# Patient Record
Sex: Male | Born: 1955
Health system: Southern US, Community
[De-identification: ages and names within clinical notes are randomized; demographics above are authoritative.]

## PROBLEM LIST (undated history)

## (undated) DIAGNOSIS — C73 Malignant neoplasm of thyroid gland: Secondary | ICD-10-CM

## (undated) DIAGNOSIS — B9561 Methicillin susceptible Staphylococcus aureus infection as the cause of diseases classified elsewhere: Secondary | ICD-10-CM

## (undated) DIAGNOSIS — I350 Nonrheumatic aortic (valve) stenosis: Secondary | ICD-10-CM

## (undated) DIAGNOSIS — F329 Major depressive disorder, single episode, unspecified: Secondary | ICD-10-CM

## (undated) DIAGNOSIS — Z992 Dependence on renal dialysis: Secondary | ICD-10-CM

## (undated) DIAGNOSIS — I472 Ventricular tachycardia, unspecified: Secondary | ICD-10-CM

## (undated) DIAGNOSIS — T826XXA Infection and inflammatory reaction due to cardiac valve prosthesis, initial encounter: Secondary | ICD-10-CM

## (undated) DIAGNOSIS — I10 Essential (primary) hypertension: Secondary | ICD-10-CM

## (undated) DIAGNOSIS — I330 Acute and subacute infective endocarditis: Secondary | ICD-10-CM

## (undated) DIAGNOSIS — Z9289 Personal history of other medical treatment: Secondary | ICD-10-CM

## (undated) DIAGNOSIS — I509 Heart failure, unspecified: Secondary | ICD-10-CM

## (undated) DIAGNOSIS — R7881 Bacteremia: Secondary | ICD-10-CM

## (undated) DIAGNOSIS — Z9889 Other specified postprocedural states: Secondary | ICD-10-CM

## (undated) DIAGNOSIS — N186 End stage renal disease: Secondary | ICD-10-CM

## (undated) DIAGNOSIS — F32A Depression, unspecified: Secondary | ICD-10-CM

## (undated) DIAGNOSIS — E890 Postprocedural hypothyroidism: Secondary | ICD-10-CM

## (undated) DIAGNOSIS — F419 Anxiety disorder, unspecified: Secondary | ICD-10-CM

## (undated) DIAGNOSIS — I38 Endocarditis, valve unspecified: Secondary | ICD-10-CM

## (undated) HISTORY — DX: Heart failure, unspecified: I50.9

## (undated) HISTORY — DX: Major depressive disorder, single episode, unspecified: F32.9

## (undated) HISTORY — DX: Ventricular tachycardia: I47.2

## (undated) HISTORY — DX: Depression, unspecified: F32.A

## (undated) HISTORY — DX: Essential (primary) hypertension: I10

## (undated) HISTORY — PX: THYROIDECTOMY: SHX17

## (undated) HISTORY — PX: CARDIAC VALVE REPLACEMENT: SHX585

## (undated) HISTORY — DX: Other specified postprocedural states: Z98.890

## (undated) HISTORY — DX: Endocarditis, valve unspecified: I38

## (undated) HISTORY — DX: Methicillin susceptible Staphylococcus aureus infection as the cause of diseases classified elsewhere: B95.61

## (undated) HISTORY — DX: Personal history of other medical treatment: Z92.89

## (undated) HISTORY — DX: Bacteremia: R78.81

## (undated) HISTORY — DX: Acute and subacute infective endocarditis: I33.0

## (undated) HISTORY — DX: End stage renal disease: Z99.2

## (undated) HISTORY — DX: Postprocedural hypothyroidism: E89.0

## (undated) HISTORY — PX: STERNOTOMY: SHX1057

## (undated) HISTORY — DX: Infection and inflammatory reaction due to cardiac valve prosthesis, initial encounter: T82.6XXA

## (undated) HISTORY — DX: Anxiety disorder, unspecified: F41.9

## (undated) HISTORY — DX: Ventricular tachycardia, unspecified: I47.20

## (undated) HISTORY — DX: End stage renal disease: N18.6

## (undated) HISTORY — DX: Nonrheumatic aortic (valve) stenosis: I35.0

---
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
## 2007-10-24 ENCOUNTER — Encounter: Payer: Self-pay | Admitting: Family Medicine

## 2009-03-05 ENCOUNTER — Ambulatory Visit: Payer: Self-pay | Admitting: Family Medicine

## 2009-03-05 DIAGNOSIS — R079 Chest pain, unspecified: Secondary | ICD-10-CM | POA: Insufficient documentation

## 2009-03-05 DIAGNOSIS — E039 Hypothyroidism, unspecified: Secondary | ICD-10-CM | POA: Insufficient documentation

## 2009-03-10 LAB — CONVERTED CEMR LAB
ALT: 20 units/L (ref 0–53)
AST: 23 units/L (ref 0–37)
Albumin: 3.8 g/dL (ref 3.5–5.2)
Alkaline Phosphatase: 38 units/L — ABNORMAL LOW (ref 39–117)
BUN: 17 mg/dL (ref 6–23)
Bilirubin, Direct: 0.1 mg/dL (ref 0.0–0.3)
CO2: 31 meq/L (ref 19–32)
Calcium: 9 mg/dL (ref 8.4–10.5)
Chloride: 111 meq/L (ref 96–112)
Cholesterol: 195 mg/dL (ref 0–200)
Creatinine, Ser: 0.8 mg/dL (ref 0.4–1.5)
GFR calc non Af Amer: 107.86 mL/min (ref >60)
Glucose, Bld: 96 mg/dL (ref 70–99)
HDL: 47.8 mg/dL (ref >39.00)
LDL Cholesterol: 135 mg/dL — ABNORMAL HIGH (ref 0–99)
Potassium: 4.3 meq/L (ref 3.5–5.1)
Sodium: 145 meq/L (ref 135–145)
TSH: 0.75 microintl units/mL (ref 0.35–5.50)
Total Bilirubin: 0.8 mg/dL (ref 0.3–1.2)
Total CHOL/HDL Ratio: 4
Total Protein: 6.5 g/dL (ref 6.0–8.3)
Triglycerides: 62 mg/dL (ref 0.0–149.0)
VLDL: 12.4 mg/dL (ref 0.0–40.0)

## 2009-07-31 HISTORY — PX: AORTIC VALVE REPLACEMENT: SHX41

## 2010-02-14 ENCOUNTER — Ambulatory Visit: Payer: Self-pay | Admitting: Family Medicine

## 2010-02-14 DIAGNOSIS — R634 Abnormal weight loss: Secondary | ICD-10-CM | POA: Insufficient documentation

## 2010-02-14 LAB — CONVERTED CEMR LAB
ALT: 15 units/L (ref 0–53)
AST: 22 units/L (ref 0–37)
Albumin: 4.1 g/dL (ref 3.5–5.2)
Alkaline Phosphatase: 39 units/L (ref 39–117)
BUN: 17 mg/dL (ref 6–23)
Basophils Absolute: 0.1 10*3/uL (ref 0.0–0.1)
Basophils Relative: 0.6 % (ref 0.0–3.0)
Bilirubin, Direct: 0.2 mg/dL (ref 0.0–0.3)
Blood Glucose, Fingerstick: 109
CO2: 32 meq/L (ref 19–32)
Calcium: 9.3 mg/dL (ref 8.4–10.5)
Chloride: 111 meq/L (ref 96–112)
Creatinine, Ser: 0.8 mg/dL (ref 0.4–1.5)
Eosinophils Absolute: 0.1 10*3/uL (ref 0.0–0.7)
Eosinophils Relative: 1.8 % (ref 0.0–5.0)
GFR calc non Af Amer: 115.77 mL/min (ref >60)
Glucose, Bld: 91 mg/dL (ref 70–99)
HCT: 40.6 % (ref 39.0–52.0)
Hemoglobin: 14.1 g/dL (ref 13.0–17.0)
Lymphocytes Relative: 19 % (ref 12.0–46.0)
Lymphs Abs: 1.5 10*3/uL (ref 0.7–4.0)
MCHC: 34.6 g/dL (ref 30.0–36.0)
MCV: 90.6 fL (ref 78.0–100.0)
Monocytes Absolute: 0.8 10*3/uL (ref 0.1–1.0)
Monocytes Relative: 9.4 % (ref 3.0–12.0)
Neutro Abs: 5.6 10*3/uL (ref 1.4–7.7)
Neutrophils Relative %: 69.2 % (ref 43.0–77.0)
PSA: 1.6 ng/mL (ref 0.10–4.00)
Platelets: 133 10*3/uL — ABNORMAL LOW (ref 150.0–400.0)
Potassium: 5.1 meq/L (ref 3.5–5.1)
RBC: 4.49 M/uL (ref 4.22–5.81)
RDW: 13.9 % (ref 11.5–14.6)
Sodium: 144 meq/L (ref 135–145)
TSH: 0.13 microintl units/mL — ABNORMAL LOW (ref 0.35–5.50)
Total Bilirubin: 0.6 mg/dL (ref 0.3–1.2)
Total Protein: 6.6 g/dL (ref 6.0–8.3)
WBC: 8.1 10*3/uL (ref 4.5–10.5)

## 2010-02-23 ENCOUNTER — Encounter: Admission: RE | Admit: 2010-02-23 | Discharge: 2010-02-23 | Payer: Self-pay | Admitting: Cardiology

## 2010-02-24 ENCOUNTER — Encounter: Admission: AD | Admit: 2010-02-24 | Discharge: 2010-02-24 | Payer: Self-pay | Admitting: Dentistry

## 2010-02-24 ENCOUNTER — Ambulatory Visit: Payer: Self-pay | Admitting: Dentistry

## 2010-02-28 ENCOUNTER — Telehealth: Payer: Self-pay | Admitting: Family Medicine

## 2010-03-02 ENCOUNTER — Ambulatory Visit: Payer: Self-pay | Admitting: Cardiology

## 2010-03-02 ENCOUNTER — Inpatient Hospital Stay (HOSPITAL_BASED_OUTPATIENT_CLINIC_OR_DEPARTMENT_OTHER): Admission: RE | Admit: 2010-03-02 | Discharge: 2010-03-02 | Payer: Self-pay | Admitting: Cardiology

## 2010-03-02 HISTORY — PX: CARDIAC CATHETERIZATION: SHX172

## 2010-03-04 ENCOUNTER — Ambulatory Visit (HOSPITAL_COMMUNITY): Admission: RE | Admit: 2010-03-04 | Discharge: 2010-03-04 | Payer: Self-pay | Admitting: Dentistry

## 2010-03-10 ENCOUNTER — Ambulatory Visit: Payer: Self-pay | Admitting: Cardiothoracic Surgery

## 2010-03-11 ENCOUNTER — Inpatient Hospital Stay (HOSPITAL_COMMUNITY): Admission: RE | Admit: 2010-03-11 | Discharge: 2010-03-16 | Payer: Self-pay | Admitting: Cardiothoracic Surgery

## 2010-03-11 ENCOUNTER — Encounter: Payer: Self-pay | Admitting: Cardiothoracic Surgery

## 2010-03-11 ENCOUNTER — Ambulatory Visit: Payer: Self-pay | Admitting: Cardiothoracic Surgery

## 2010-03-14 ENCOUNTER — Ambulatory Visit: Payer: Self-pay | Admitting: Cardiology

## 2010-03-31 HISTORY — PX: TRANSTHORACIC ECHOCARDIOGRAM: SHX275

## 2010-04-05 ENCOUNTER — Ambulatory Visit: Payer: Self-pay | Admitting: Cardiothoracic Surgery

## 2010-04-05 ENCOUNTER — Encounter: Payer: Self-pay | Admitting: Family Medicine

## 2010-04-05 ENCOUNTER — Encounter: Admission: RE | Admit: 2010-04-05 | Discharge: 2010-04-05 | Payer: Self-pay | Admitting: Cardiothoracic Surgery

## 2010-04-05 ENCOUNTER — Ambulatory Visit: Payer: Self-pay | Admitting: Cardiology

## 2010-04-06 ENCOUNTER — Telehealth: Payer: Self-pay | Admitting: Family Medicine

## 2010-04-08 ENCOUNTER — Encounter: Payer: Self-pay | Admitting: Family Medicine

## 2010-04-08 ENCOUNTER — Ambulatory Visit: Payer: Self-pay | Admitting: Cardiology

## 2010-04-08 ENCOUNTER — Ambulatory Visit (HOSPITAL_COMMUNITY): Admission: RE | Admit: 2010-04-08 | Discharge: 2010-04-08 | Payer: Self-pay | Admitting: Cardiology

## 2010-05-06 ENCOUNTER — Ambulatory Visit: Payer: Self-pay | Admitting: Cardiovascular Disease

## 2010-05-06 ENCOUNTER — Encounter: Payer: Self-pay | Admitting: Family Medicine

## 2010-05-16 ENCOUNTER — Ambulatory Visit: Payer: Self-pay | Admitting: Dentistry

## 2010-07-31 HISTORY — PX: SHOULDER ARTHROSCOPY W/ ROTATOR CUFF REPAIR: SHX2400

## 2010-08-08 ENCOUNTER — Encounter: Payer: Self-pay | Admitting: Family Medicine

## 2010-08-10 ENCOUNTER — Ambulatory Visit: Payer: Self-pay | Admitting: Cardiology

## 2010-08-25 ENCOUNTER — Ambulatory Visit: Payer: Self-pay | Admitting: Cardiology

## 2010-08-30 NOTE — Progress Notes (Signed)
Summary: Pt called to get lab result and ? referral for prostate issues  Phone Note Call from Patient Call back at Home Phone [phone] Call back at Work Phone [phone]   Caller: Patient Summary of Call: Pt called an was sch for ov today re: prostate issues, but pt has cx the appt. Pt wants to know what the lab results were and also who Dr. Elease Hashimoto would refer pt to, if he did need to see a specialist re: prostate issues?  Pls call at earliest convenience.  Initial call taken by: Braulio Bosch,  April 06, 2010 8:42 AM  Follow-up for Phone Call        I'm not sure what this note means.  What lab result is he referring to ?   We need to see if having prostate issues. Follow-up by: [name] MD,  April 06, 2010 9:18 AM  Additional Follow-up for Phone Call Additional follow up Details #1::        I called and lft a vm for pt to return call.    Additional Follow-up by: Braulio Bosch,  April 06, 2010 3:20 PM    Additional Follow-up for Phone Call Additional follow up Details #2::    Pt called back and I notify him of the info noted above. Pt is going to call back and sch an appt with Dr. Elease Hashimoto re: prostate.    Follow-up by: Braulio Bosch,  April 06, 2010 4:22 PM

## 2010-08-30 NOTE — Assessment & Plan Note (Signed)
Summary: WT LOSS/?TSH/TROUBLE SLEEPING/NJR   Vital Signs:  Patient profile:   55 year old male Weight:      179 pounds Temp:     97.7 degrees F oral BP sitting:   100 / 72  (left arm) Cuff size:   regular  Vitals Entered By: Nira Conn LPN (July 18, 624THL 579FGE PM)  CC: Weight loss, sleeping problems, fatigue, depression CBG Result 109   History of Present Illness: Patient is seen with multiple complaints including weight loss, increased depressive symptoms over several weeks if not months, and some recent chest pains and progressive dyspnea with exertion.we had recommended cardiologist last year but he never went because of lack of insurance coverage and cost. He has some sort of congenital heart disease history from childhood. Has seen a cardiologist several years ago but not recently. He has some chest tightness that mostly is related to exertion the symptoms are inconsistent. Prominent heart murmur for several years.  He has lost approximately 32 pounds over several months. Only recently has noted decreased appetite. Denies any nausea, vomiting, fever, chills, cough, abdominal pain, dysuria, or a change in bowel habits. Nonsmoker. No prior colonoscopy screening.  Increased depressive symptoms over several weeks if not months. Recent loss of grandfather and a pet. Decreased motivation. Decreased sleep. Frequent crying spells. No suicidal ideation.  Allergies (verified): No Known Drug Allergies  Past History:  Past Medical History: Last updated: 03/05/2009 Blood in stool Heart murmur Hypothyroidism s/p thyroidectomy  Past Surgical History: Last updated: 03/05/2009 Open Heart 1968 Thyroidectomy 2005 (?indication, ?malignancy)  Family History: Last updated: 03/05/2009 Family History Hypertension Family History of Stroke F 1st degree relative <60 Family history heart disease, father Family history kidney disease  Social History: Last updated: 03/05/2009 Occupation:   Insurance underwriter , Psychologist, sport and exercise Married Never Smoked Alcohol use-no  Risk Factors: Smoking Status: never (03/05/2009) PMH-FH-SH reviewed for relevance  Review of Systems       The patient complains of anorexia, weight loss, chest pain, and dyspnea on exertion.  The patient denies fever, peripheral edema, prolonged cough, headaches, hemoptysis, abdominal pain, melena, hematochezia, hematuria, incontinence, genital sores, muscle weakness, suspicious skin lesions, and enlarged lymph nodes.    Physical Exam  General:  Well-developed,well-nourished,in no acute distress; alert,appropriate and cooperative throughout examination Mouth:  Oral mucosa and oropharynx without lesions or exudates.  Teeth in good repair. Neck:  No deformities, masses, or tenderness noted. Lungs:  Normal respiratory effort, chest expands symmetrically. Lungs are clear to auscultation, no crackles or wheezes. Heart:  regular rhythm and rate with a 3/6 systolic murmur right upper sternal border Abdomen:  soft, non-tender, normal bowel sounds, no distention, no masses, no guarding, no hepatomegaly, and no splenomegaly.   Rectal:  No external abnormalities noted. Normal sphincter tone. No rectal masses or tenderness. Prostate:  Prostate gland firm and smooth, no enlargement, nodularity, tenderness, mass, asymmetry or induration. Extremities:  no edema Neurologic:  alert & oriented X3 and cranial nerves II-XII intact.   Skin:  no suspicious lesions.   Psych:  normally interactive, good eye contact, not anxious appearing, and not depressed appearing.     Impression & Recommendations:  Problem # 1:  WEIGHT LOSS (ICD-783.21) ?mjultifactorial.  DDX depression, malignancy, over replaced thyroid. Orders: Venipuncture [id number]) TLB-BMP (Basic Metabolic Panel-BMET) (99991111) TLB-CBC Platelet - w/Differential (85025-CBCD) TLB-Hepatic/Liver Function Pnl (80076-HEPATIC) TLB-TSH (Thyroid Stimulating Hormone) (84443-TSH) TLB-PSA (Prostate  Specific Antigen) (84153-PSA)  Problem # 2:  DEPRESSION (ICD-311) Assessment: New sertrline 50 mg daily. His updated medication  list for this problem includes:    Sertraline Hcl 50 Mg Tabs (Sertraline hcl) ..... One by mouth once daily  Problem # 3:  CHEST PAIN, EXERTIONAL (ICD-786.50) Assessment: New EKG sinus with LVH changes.  Pt needs to get back in to see cardiologist and we will arrange. Orders: EKG w/ Interpretation (93000) Cardiology Referral (Cardiology)  Problem # 4:  HYPOTHYROIDISM (ICD-244.9)  His updated medication list for this problem includes:    Levothyroxine Sodium 200 Mcg Tabs (Levothyroxine sodium) ..... One by mouth once daily  Orders: TLB-TSH (Thyroid Stimulating Hormone) (84443-TSH)  Complete Medication List: 1)  Levothyroxine Sodium 200 Mcg Tabs (Levothyroxine sodium) .... One by mouth once daily 2)  Nitrostat 0.4 Mg Subl (Nitroglycerin) .... One sublingual q 5 minutes as needed chest pain 3)  Sertraline Hcl 50 Mg Tabs (Sertraline hcl) .... One by mouth once daily  Patient Instructions: 1)  Please schedule a follow-up appointment in 1 month.  Prescriptions: SERTRALINE HCL 50 MG TABS (SERTRALINE HCL) one by mouth once daily  #30 x 3   Entered and Authorized by:   [name] MD   Signed by:   [name] MD on 02/14/2010   Method used:   Electronically to        [facility]  [phone]* (retail)       [address]       [place], [place]  [address]       Ph: [phone] or [phone]       Fax: [phone]   RxID:   [id number]

## 2010-08-30 NOTE — Progress Notes (Signed)
Summary: FYI/dm  Phone Note Call from Patient   Caller: Patient Call For: [name] MD Summary of Call: Pt is having surgery at [facility] (valve replacement) on 03/11/2010. Initial call taken by: Deanna Artis CMA,  February 28, 2010 2:21 PM  Follow-up for Phone Call        noted.  I spoke with pt.  Follow-up by: [name] MD,  February 28, 2010 3:34 PM

## 2010-08-30 NOTE — Letter (Signed)
Summary: Progress Note/[facility]  Progress Note/[facility]   Imported By: Laural Benes 04/15/2010 11:31:22  _____________________________________________________________________  External Attachment:    Type:   Image     Comment:   External Document

## 2010-08-30 NOTE — Letter (Signed)
Summary: [facility]   Imported By: Laural Benes 05/12/2010 12:45:53  _____________________________________________________________________  External Attachment:    Type:   Image     Comment:   External Document

## 2010-08-30 NOTE — Assessment & Plan Note (Signed)
Summary: FU ON MEDS/NJR   Vital Signs:  Patient profile:   55 year old male Height:      72.5 inches Weight:      211 pounds BMI:     28.33 Temp:     98 degrees F oral Pulse rate:   72 / minute Pulse rhythm:   regular Resp:     12 per minute BP sitting:   120 / 70  (left arm) Cuff size:   regular  Vitals Entered By: Nira Conn LPN (August  6, 624THL 9:31 AM)  Nutrition Counseling: Patient's BMI is greater than 25 and therefore counseled on weight management options. CC: Previous [facility] pt, to establish, refill meds   History of Present Illness: 55 year old gentleman here to establish care. Previously a patient of mine at [facility]. Past medical history significant for hypothyroidism. He is compliant with medication. Last labs approximately one year ago. Patient also gives history of presumably some sort of congenital heart disease and he recalls having surgery on his aortic valve at age 11.  We have no old records at this time. No recent cardiology followup.  Patient relates that for at least a year possibly longer he has had fairly consistent symptoms of exertion-related substernal chest tightness which usually last about a minute with activity such as brisk walking or going up stairs.  No formal exercise.  No radiation of pain.  He has associated dyspnea and symptoms are relieved within about a minute or 2 of rest. No associated nausea or diaphoresis.  No syncope.  He takes aspirin but inconsistently. Family history of father having some sort of heart problem and he died at age 48 reportedly of stroke complications. Patient is a nonsmoker. No history of diabetes. Lipid status unknown.  Preventive Screening-Counseling & Management  Alcohol-Tobacco     Smoking Status: never  Past History:  Past Medical History: Blood in stool Heart murmur Hypothyroidism s/p thyroidectomy  Past Surgical History: Open Heart 1968 Thyroidectomy 2005 (?indication,  ?malignancy)  Family History: Family History Hypertension Family History of Stroke F 1st degree relative <60 Family history heart disease, father Family history kidney disease  Social History: Occupation:  Insurance underwriter , Psychologist, sport and exercise Married Never Smoked Alcohol use-no Occupation:  employed Smoking Status:  never  Review of Systems       The patient complains of chest pain and dyspnea on exertion.  The patient denies anorexia, weight loss, weight gain, hoarseness, syncope, peripheral edema, prolonged cough, headaches, hemoptysis, abdominal pain, melena, hematochezia, and severe indigestion/heartburn.    Physical Exam  General:  Well-developed,well-nourished,in no acute distress; alert,appropriate and cooperative throughout examination Head:  Normocephalic and atraumatic without obvious abnormalities. No apparent alopecia or balding. Mouth:  Oral mucosa and oropharynx without lesions or exudates.  Teeth in good repair. Neck:  No deformities, masses, or tenderness noted. Lungs:  Normal respiratory effort, chest expands symmetrically. Lungs are clear to auscultation, no crackles or wheezes. Heart:  regular rhythm and rate with four over 6 holosystolic ejection murmur.  no S3 noted. Abdomen:  Bowel sounds positive,abdomen soft and non-tender without masses, organomegaly or hernias noted. Extremities:  no peripheral edema noted   Impression & Recommendations:  Problem # 1:  CHEST PAIN, EXERTIONAL (ICD-786.50)  Patient has symptoms concerning for possible exertional angina over the past year. Also has past history of some sort of heart surgery in childhood, presumably aortic aortic valve and very prominent heart murmur which has not been evaluated recently.  EKG shows LVH changes which  raises concern about  possible aortic stenosis.  Needs cardiology evaluation. Start aspirin one daily. Check lipid status.  Refer [facility].  Orders: TLB-Lipid Panel (80061-LIPID) TLB-BMP (Basic  Metabolic Panel-BMET) (99991111) TLB-Hepatic/Liver Function Pnl (80076-HEPATIC) EKG w/ Interpretation (93000) Cardiology Referral (Cardiology)  Problem # 2:  HYPOTHYROIDISM (ICD-244.9) reassess TSH today. Orders: TLB-TSH (Thyroid Stimulating Hormone) (84443-TSH)  His updated medication list for this problem includes:    Levothyroxine Sodium 200 Mcg Tabs (Levothyroxine sodium) ..... One by mouth once daily  Complete Medication List: 1)  Levothyroxine Sodium 200 Mcg Tabs (Levothyroxine sodium) .... One by mouth once daily 2)  Nitrostat 0.4 Mg Subl (Nitroglycerin) .... One sublingual q 5 minutes as needed chest pain  Patient Instructions: 1)  Start baby aspirin one daily. 2)  Avoid heavy exertional activities until further evaluated by cardiologist. 3)  Follow up immediately if he had any worsening chest pains. Prescriptions: NITROSTAT 0.4 MG SUBL (NITROGLYCERIN) one sublingual q 5 minutes as needed chest pain  #30 x 0   Entered and Authorized by:   [name] MD   Signed by:   [name] MD on 03/05/2009   Method used:   Electronically to        [facility]  [phone]* (retail)       [address]       [place], [place]  [address]       Ph: [phone] or [phone]       Fax: [phone]   RxID:   [id number] LEVOTHYROXINE SODIUM 200 MCG TABS (LEVOTHYROXINE SODIUM) one by mouth once daily  #90 x 3   Entered and Authorized by:   [name] MD   Signed by:   [name] MD on 03/05/2009   Method used:   Electronically to        [facility]  [phone]* (retail)       [address]       [place], [place]  [address]       Ph: [phone] or [phone]       Fax: [phone]   RxID:   [id number]   Preventive Care Screening  Last Tetanus Booster:    Date:  07/31/2004    Results:  Historical

## 2010-08-30 NOTE — Letter (Signed)
Summary: Records from [facility] 2007 - 2009  Records from [facility] 2007 - 2009   Imported By: Laural Benes 03/11/2009 10:59:44  _____________________________________________________________________  External Attachment:    Type:   Image     Comment:   External Document

## 2010-09-01 NOTE — Letter (Signed)
Summary: [facility]   Imported By: Laural Benes 08/15/2010 11:20:08  _____________________________________________________________________  External Attachment:    Type:   Image     Comment:   External Document

## 2010-09-01 NOTE — Letter (Signed)
Summary: [facility]   Imported By: Laural Benes 08/16/2010 09:30:31  _____________________________________________________________________  External Attachment:    Type:   Image     Comment:   External Document

## 2010-09-14 ENCOUNTER — Other Ambulatory Visit: Payer: Self-pay | Admitting: Family Medicine

## 2010-09-14 DIAGNOSIS — E039 Hypothyroidism, unspecified: Secondary | ICD-10-CM

## 2010-09-15 ENCOUNTER — Other Ambulatory Visit: Payer: Self-pay | Admitting: *Deleted

## 2010-09-23 ENCOUNTER — Ambulatory Visit: Payer: Self-pay | Admitting: *Deleted

## 2010-09-26 ENCOUNTER — Ambulatory Visit (INDEPENDENT_AMBULATORY_CARE_PROVIDER_SITE_OTHER): Payer: Self-pay | Admitting: Nurse Practitioner

## 2010-09-26 DIAGNOSIS — I10 Essential (primary) hypertension: Secondary | ICD-10-CM

## 2010-09-26 DIAGNOSIS — I059 Rheumatic mitral valve disease, unspecified: Secondary | ICD-10-CM

## 2010-10-14 LAB — POCT I-STAT 3, ART BLOOD GAS (G3+)
Acid-Base Excess: 1 mmol/L (ref 0.0–2.0)
Acid-Base Excess: 1 mmol/L (ref 0.0–2.0)
Acid-Base Excess: 2 mmol/L (ref 0.0–2.0)
Acid-base deficit: 1 mmol/L (ref 0.0–2.0)
Acid-base deficit: 1 mmol/L (ref 0.0–2.0)
Bicarbonate: 24.2 mEq/L — ABNORMAL HIGH (ref 20.0–24.0)
Bicarbonate: 24.5 mEq/L — ABNORMAL HIGH (ref 20.0–24.0)
Bicarbonate: 25.3 mEq/L — ABNORMAL HIGH (ref 20.0–24.0)
Bicarbonate: 26.3 mEq/L — ABNORMAL HIGH (ref 20.0–24.0)
Bicarbonate: 27.1 mEq/L — ABNORMAL HIGH (ref 20.0–24.0)
Bicarbonate: 27.1 mEq/L — ABNORMAL HIGH (ref 20.0–24.0)
O2 Saturation: 100 %
O2 Saturation: 100 %
O2 Saturation: 100 %
O2 Saturation: 97 %
O2 Saturation: 97 %
O2 Saturation: 99 %
Patient temperature: 36.3
Patient temperature: 36.9
TCO2: 25 mmol/L (ref 0–100)
TCO2: 26 mmol/L (ref 0–100)
TCO2: 27 mmol/L (ref 0–100)
TCO2: 28 mmol/L (ref 0–100)
TCO2: 28 mmol/L (ref 0–100)
TCO2: 29 mmol/L (ref 0–100)
pCO2 arterial: 39.4 mmHg (ref 35.0–45.0)
pCO2 arterial: 40.4 mmHg (ref 35.0–45.0)
pCO2 arterial: 41.3 mmHg (ref 35.0–45.0)
pCO2 arterial: 43.9 mmHg (ref 35.0–45.0)
pCO2 arterial: 49.7 mmHg — ABNORMAL HIGH (ref 35.0–45.0)
pCO2 arterial: 50.5 mmHg — ABNORMAL HIGH (ref 35.0–45.0)
pH, Arterial: 7.308 — ABNORMAL LOW (ref 7.350–7.450)
pH, Arterial: 7.345 — ABNORMAL LOW (ref 7.350–7.450)
pH, Arterial: 7.383 (ref 7.350–7.450)
pH, Arterial: 7.385 (ref 7.350–7.450)
pH, Arterial: 7.401 (ref 7.350–7.450)
pH, Arterial: 7.424 (ref 7.350–7.450)
pO2, Arterial: 129 mmHg — ABNORMAL HIGH (ref 80.0–100.0)
pO2, Arterial: 341 mmHg — ABNORMAL HIGH (ref 80.0–100.0)
pO2, Arterial: 381 mmHg — ABNORMAL HIGH (ref 80.0–100.0)
pO2, Arterial: 381 mmHg — ABNORMAL HIGH (ref 80.0–100.0)
pO2, Arterial: 86 mmHg (ref 80.0–100.0)
pO2, Arterial: 87 mmHg (ref 80.0–100.0)

## 2010-10-14 LAB — GLUCOSE, CAPILLARY
Glucose-Capillary: 100 mg/dL — ABNORMAL HIGH (ref 70–99)
Glucose-Capillary: 101 mg/dL — ABNORMAL HIGH (ref 70–99)
Glucose-Capillary: 103 mg/dL — ABNORMAL HIGH (ref 70–99)
Glucose-Capillary: 109 mg/dL — ABNORMAL HIGH (ref 70–99)
Glucose-Capillary: 109 mg/dL — ABNORMAL HIGH (ref 70–99)
Glucose-Capillary: 113 mg/dL — ABNORMAL HIGH (ref 70–99)
Glucose-Capillary: 134 mg/dL — ABNORMAL HIGH (ref 70–99)
Glucose-Capillary: 94 mg/dL (ref 70–99)
Glucose-Capillary: 95 mg/dL (ref 70–99)
Glucose-Capillary: 97 mg/dL (ref 70–99)
Glucose-Capillary: 104 mg/dL — ABNORMAL HIGH (ref 70–99)
Glucose-Capillary: 110 mg/dL — ABNORMAL HIGH (ref 70–99)
Glucose-Capillary: 130 mg/dL — ABNORMAL HIGH (ref 70–99)
Glucose-Capillary: 136 mg/dL — ABNORMAL HIGH (ref 70–99)
Glucose-Capillary: 141 mg/dL — ABNORMAL HIGH (ref 70–99)
Glucose-Capillary: 147 mg/dL — ABNORMAL HIGH (ref 70–99)
Glucose-Capillary: 151 mg/dL — ABNORMAL HIGH (ref 70–99)
Glucose-Capillary: 154 mg/dL — ABNORMAL HIGH (ref 70–99)
Glucose-Capillary: 154 mg/dL — ABNORMAL HIGH (ref 70–99)
Glucose-Capillary: 163 mg/dL — ABNORMAL HIGH (ref 70–99)
Glucose-Capillary: 168 mg/dL — ABNORMAL HIGH (ref 70–99)
Glucose-Capillary: 54 mg/dL — ABNORMAL LOW (ref 70–99)
Glucose-Capillary: 55 mg/dL — ABNORMAL LOW (ref 70–99)
Glucose-Capillary: 80 mg/dL (ref 70–99)
Glucose-Capillary: 81 mg/dL (ref 70–99)
Glucose-Capillary: 84 mg/dL (ref 70–99)
Glucose-Capillary: 86 mg/dL (ref 70–99)

## 2010-10-14 LAB — BASIC METABOLIC PANEL
BUN: 17 mg/dL (ref 6–23)
BUN: 11 mg/dL (ref 6–23)
BUN: 12 mg/dL (ref 6–23)
BUN: 14 mg/dL (ref 6–23)
CO2: 30 mEq/L (ref 19–32)
CO2: 25 mEq/L (ref 19–32)
CO2: 28 mEq/L (ref 19–32)
CO2: 30 mEq/L (ref 19–32)
Calcium: 8.3 mg/dL — ABNORMAL LOW (ref 8.4–10.5)
Calcium: 8.1 mg/dL — ABNORMAL LOW (ref 8.4–10.5)
Calcium: 8.3 mg/dL — ABNORMAL LOW (ref 8.4–10.5)
Calcium: 9.5 mg/dL (ref 8.4–10.5)
Chloride: 103 mEq/L (ref 96–112)
Chloride: 104 mEq/L (ref 96–112)
Chloride: 107 mEq/L (ref 96–112)
Chloride: 109 mEq/L (ref 96–112)
Creatinine, Ser: 0.72 mg/dL (ref 0.4–1.5)
Creatinine, Ser: 0.7 mg/dL (ref 0.4–1.5)
Creatinine, Ser: 0.76 mg/dL (ref 0.4–1.5)
Creatinine, Ser: 0.77 mg/dL (ref 0.4–1.5)
GFR calc Af Amer: >60 mL/min (ref >60)
GFR calc Af Amer: >60 mL/min (ref >60)
GFR calc Af Amer: >60 mL/min (ref >60)
GFR calc Af Amer: >60 mL/min (ref >60)
GFR calc non Af Amer: >60 mL/min (ref >60)
GFR calc non Af Amer: >60 mL/min (ref >60)
GFR calc non Af Amer: >60 mL/min (ref >60)
GFR calc non Af Amer: >60 mL/min (ref >60)
Glucose, Bld: 120 mg/dL — ABNORMAL HIGH (ref 70–99)
Glucose, Bld: 100 mg/dL — ABNORMAL HIGH (ref 70–99)
Glucose, Bld: 121 mg/dL — ABNORMAL HIGH (ref 70–99)
Glucose, Bld: 138 mg/dL — ABNORMAL HIGH (ref 70–99)
Potassium: 4 mEq/L (ref 3.5–5.1)
Potassium: 3.9 mEq/L (ref 3.5–5.1)
Potassium: 3.9 mEq/L (ref 3.5–5.1)
Potassium: 4.5 mEq/L (ref 3.5–5.1)
Sodium: 140 mEq/L (ref 135–145)
Sodium: 138 mEq/L (ref 135–145)
Sodium: 142 mEq/L (ref 135–145)
Sodium: 143 mEq/L (ref 135–145)

## 2010-10-14 LAB — CBC
HCT: 25.9 % — ABNORMAL LOW (ref 39.0–52.0)
HCT: 26.2 % — ABNORMAL LOW (ref 39.0–52.0)
HCT: 26.3 % — ABNORMAL LOW (ref 39.0–52.0)
HCT: 27 % — ABNORMAL LOW (ref 39.0–52.0)
HCT: 31.9 % — ABNORMAL LOW (ref 39.0–52.0)
HCT: 38.9 % — ABNORMAL LOW (ref 39.0–52.0)
HCT: 39.8 % (ref 39.0–52.0)
Hemoglobin: 8.7 g/dL — ABNORMAL LOW (ref 13.0–17.0)
Hemoglobin: 11 g/dL — ABNORMAL LOW (ref 13.0–17.0)
Hemoglobin: 13.5 g/dL (ref 13.0–17.0)
Hemoglobin: 13.9 g/dL (ref 13.0–17.0)
Hemoglobin: 8.8 g/dL — ABNORMAL LOW (ref 13.0–17.0)
Hemoglobin: 8.8 g/dL — ABNORMAL LOW (ref 13.0–17.0)
Hemoglobin: 9.2 g/dL — ABNORMAL LOW (ref 13.0–17.0)
MCH: 30.1 pg (ref 26.0–34.0)
MCH: 29.8 pg (ref 26.0–34.0)
MCH: 30.1 pg (ref 26.0–34.0)
MCH: 30.1 pg (ref 26.0–34.0)
MCH: 30.2 pg (ref 26.0–34.0)
MCH: 30.3 pg (ref 26.0–34.0)
MCH: 31.1 pg (ref 26.0–34.0)
MCHC: 33.6 g/dL (ref 30.0–36.0)
MCHC: 33.5 g/dL (ref 30.0–36.0)
MCHC: 33.6 g/dL (ref 30.0–36.0)
MCHC: 34.1 g/dL (ref 30.0–36.0)
MCHC: 34.5 g/dL (ref 30.0–36.0)
MCHC: 34.7 g/dL (ref 30.0–36.0)
MCHC: 34.9 g/dL (ref 30.0–36.0)
MCV: 89.6 fL (ref 78.0–100.0)
MCV: 87.2 fL (ref 78.0–100.0)
MCV: 87.2 fL (ref 78.0–100.0)
MCV: 88.2 fL (ref 78.0–100.0)
MCV: 89 fL (ref 78.0–100.0)
MCV: 89.2 fL (ref 78.0–100.0)
MCV: 90 fL (ref 78.0–100.0)
Platelets: 74 10*3/uL — ABNORMAL LOW (ref 150–400)
Platelets: 120 10*3/uL — ABNORMAL LOW (ref 150–400)
Platelets: 143 10*3/uL — ABNORMAL LOW (ref 150–400)
Platelets: 62 10*3/uL — ABNORMAL LOW (ref 150–400)
Platelets: 62 10*3/uL — ABNORMAL LOW (ref 150–400)
Platelets: 75 10*3/uL — ABNORMAL LOW (ref 150–400)
Platelets: 85 10*3/uL — ABNORMAL LOW (ref 150–400)
RBC: 2.89 MIL/uL — ABNORMAL LOW (ref 4.22–5.81)
RBC: 2.91 MIL/uL — ABNORMAL LOW (ref 4.22–5.81)
RBC: 2.95 MIL/uL — ABNORMAL LOW (ref 4.22–5.81)
RBC: 3.06 MIL/uL — ABNORMAL LOW (ref 4.22–5.81)
RBC: 3.66 MIL/uL — ABNORMAL LOW (ref 4.22–5.81)
RBC: 4.46 MIL/uL (ref 4.22–5.81)
RBC: 4.47 MIL/uL (ref 4.22–5.81)
RDW: 13.9 % (ref 11.5–15.5)
RDW: 13.2 % (ref 11.5–15.5)
RDW: 13.2 % (ref 11.5–15.5)
RDW: 13.4 % (ref 11.5–15.5)
RDW: 13.7 % (ref 11.5–15.5)
RDW: 13.9 % (ref 11.5–15.5)
RDW: 13.9 % (ref 11.5–15.5)
WBC: 8.7 10*3/uL (ref 4.0–10.5)
WBC: 10.6 10*3/uL — ABNORMAL HIGH (ref 4.0–10.5)
WBC: 10.8 10*3/uL — ABNORMAL HIGH (ref 4.0–10.5)
WBC: 11 10*3/uL — ABNORMAL HIGH (ref 4.0–10.5)
WBC: 13.4 10*3/uL — ABNORMAL HIGH (ref 4.0–10.5)
WBC: 4.7 10*3/uL (ref 4.0–10.5)
WBC: 5.2 10*3/uL (ref 4.0–10.5)

## 2010-10-14 LAB — POCT I-STAT 4, (NA,K, GLUC, HGB,HCT)
Glucose, Bld: 104 mg/dL — ABNORMAL HIGH (ref 70–99)
Glucose, Bld: 106 mg/dL — ABNORMAL HIGH (ref 70–99)
Glucose, Bld: 106 mg/dL — ABNORMAL HIGH (ref 70–99)
Glucose, Bld: 112 mg/dL — ABNORMAL HIGH (ref 70–99)
Glucose, Bld: 113 mg/dL — ABNORMAL HIGH (ref 70–99)
Glucose, Bld: 137 mg/dL — ABNORMAL HIGH (ref 70–99)
Glucose, Bld: 141 mg/dL — ABNORMAL HIGH (ref 70–99)
Glucose, Bld: 177 mg/dL — ABNORMAL HIGH (ref 70–99)
Glucose, Bld: 81 mg/dL (ref 70–99)
Glucose, Bld: 97 mg/dL (ref 70–99)
HCT: 25 % — ABNORMAL LOW (ref 39.0–52.0)
HCT: 26 % — ABNORMAL LOW (ref 39.0–52.0)
HCT: 26 % — ABNORMAL LOW (ref 39.0–52.0)
HCT: 26 % — ABNORMAL LOW (ref 39.0–52.0)
HCT: 26 % — ABNORMAL LOW (ref 39.0–52.0)
HCT: 27 % — ABNORMAL LOW (ref 39.0–52.0)
HCT: 27 % — ABNORMAL LOW (ref 39.0–52.0)
HCT: 31 % — ABNORMAL LOW (ref 39.0–52.0)
HCT: 32 % — ABNORMAL LOW (ref 39.0–52.0)
HCT: 34 % — ABNORMAL LOW (ref 39.0–52.0)
Hemoglobin: 10.5 g/dL — ABNORMAL LOW (ref 13.0–17.0)
Hemoglobin: 10.9 g/dL — ABNORMAL LOW (ref 13.0–17.0)
Hemoglobin: 11.6 g/dL — ABNORMAL LOW (ref 13.0–17.0)
Hemoglobin: 8.5 g/dL — ABNORMAL LOW (ref 13.0–17.0)
Hemoglobin: 8.8 g/dL — ABNORMAL LOW (ref 13.0–17.0)
Hemoglobin: 8.8 g/dL — ABNORMAL LOW (ref 13.0–17.0)
Hemoglobin: 8.8 g/dL — ABNORMAL LOW (ref 13.0–17.0)
Hemoglobin: 8.8 g/dL — ABNORMAL LOW (ref 13.0–17.0)
Hemoglobin: 9.2 g/dL — ABNORMAL LOW (ref 13.0–17.0)
Hemoglobin: 9.2 g/dL — ABNORMAL LOW (ref 13.0–17.0)
Potassium: 3.9 mEq/L (ref 3.5–5.1)
Potassium: 3.9 mEq/L (ref 3.5–5.1)
Potassium: 3.9 mEq/L (ref 3.5–5.1)
Potassium: 4 mEq/L (ref 3.5–5.1)
Potassium: 4.1 mEq/L (ref 3.5–5.1)
Potassium: 4.1 mEq/L (ref 3.5–5.1)
Potassium: 4.2 mEq/L (ref 3.5–5.1)
Potassium: 4.5 mEq/L (ref 3.5–5.1)
Potassium: 4.6 mEq/L (ref 3.5–5.1)
Potassium: 4.6 mEq/L (ref 3.5–5.1)
Sodium: 137 mEq/L (ref 135–145)
Sodium: 139 mEq/L (ref 135–145)
Sodium: 140 mEq/L (ref 135–145)
Sodium: 141 mEq/L (ref 135–145)
Sodium: 141 mEq/L (ref 135–145)
Sodium: 141 mEq/L (ref 135–145)
Sodium: 141 mEq/L (ref 135–145)
Sodium: 143 mEq/L (ref 135–145)
Sodium: 144 mEq/L (ref 135–145)
Sodium: 144 mEq/L (ref 135–145)

## 2010-10-14 LAB — BLOOD GAS, ARTERIAL
Acid-Base Excess: 0.6 mmol/L (ref 0.0–2.0)
Bicarbonate: 23.9 mEq/L (ref 20.0–24.0)
Drawn by: 333511
FIO2: 0.21 %
O2 Saturation: 98.6 %
Patient temperature: 98.6
TCO2: 24.9 mmol/L (ref 0–100)
pCO2 arterial: 33.2 mmHg — ABNORMAL LOW (ref 35.0–45.0)
pH, Arterial: 7.47 — ABNORMAL HIGH (ref 7.350–7.450)
pO2, Arterial: 104 mmHg — ABNORMAL HIGH (ref 80.0–100.0)

## 2010-10-14 LAB — POCT I-STAT 3, VENOUS BLOOD GAS (G3P V)
Acid-base deficit: 1 mmol/L (ref 0.0–2.0)
Bicarbonate: 26.2 mEq/L — ABNORMAL HIGH (ref 20.0–24.0)
Bicarbonate: 26.2 mEq/L — ABNORMAL HIGH (ref 20.0–24.0)
O2 Saturation: 65 %
O2 Saturation: 76 %
TCO2: 28 mmol/L (ref 0–100)
TCO2: 28 mmol/L (ref 0–100)
pCO2, Ven: 46.4 mmHg (ref 45.0–50.0)
pCO2, Ven: 53.7 mmHg — ABNORMAL HIGH (ref 45.0–50.0)
pH, Ven: 7.296 (ref 7.250–7.300)
pH, Ven: 7.359 — ABNORMAL HIGH (ref 7.250–7.300)
pO2, Ven: 35 mmHg (ref 30.0–45.0)
pO2, Ven: 46 mmHg — ABNORMAL HIGH (ref 30.0–45.0)

## 2010-10-14 LAB — CREATININE, SERUM
Creatinine, Ser: 0.74 mg/dL (ref 0.4–1.5)
GFR calc Af Amer: >60 mL/min (ref >60)
GFR calc non Af Amer: >60 mL/min (ref >60)

## 2010-10-14 LAB — PROTIME-INR
INR: 1 (ref 0.00–1.49)
INR: 1.05 (ref 0.00–1.49)
INR: 1.72 — ABNORMAL HIGH (ref 0.00–1.49)
Prothrombin Time: 13.4 seconds (ref 11.6–15.2)
Prothrombin Time: 13.9 seconds (ref 11.6–15.2)
Prothrombin Time: 20.3 seconds — ABNORMAL HIGH (ref 11.6–15.2)

## 2010-10-14 LAB — TYPE AND SCREEN
ABO/RH(D): A NEG
Antibody Screen: NEGATIVE

## 2010-10-14 LAB — URINALYSIS, ROUTINE W REFLEX MICROSCOPIC
Glucose, UA: NEGATIVE mg/dL
Hgb urine dipstick: NEGATIVE
Ketones, ur: 15 mg/dL — AB
Nitrite: NEGATIVE
Protein, ur: NEGATIVE mg/dL
Specific Gravity, Urine: 1.025 (ref 1.005–1.030)
Urobilinogen, UA: 1 mg/dL (ref 0.0–1.0)
pH: 6.5 (ref 5.0–8.0)

## 2010-10-14 LAB — SURGICAL PCR SCREEN
MRSA, PCR: NEGATIVE
MRSA, PCR: NEGATIVE
Staphylococcus aureus: NEGATIVE
Staphylococcus aureus: NEGATIVE

## 2010-10-14 LAB — PREPARE PLATELETS

## 2010-10-14 LAB — COMPREHENSIVE METABOLIC PANEL
ALT: 17 U/L (ref 0–53)
AST: 24 U/L (ref 0–37)
Albumin: 4 g/dL (ref 3.5–5.2)
Alkaline Phosphatase: 43 U/L (ref 39–117)
BUN: 13 mg/dL (ref 6–23)
CO2: 24 mEq/L (ref 19–32)
Calcium: 9.2 mg/dL (ref 8.4–10.5)
Chloride: 109 mEq/L (ref 96–112)
Creatinine, Ser: 0.77 mg/dL (ref 0.4–1.5)
GFR calc Af Amer: >60 mL/min (ref >60)
GFR calc non Af Amer: >60 mL/min (ref >60)
Glucose, Bld: 108 mg/dL — ABNORMAL HIGH (ref 70–99)
Potassium: 4.1 mEq/L (ref 3.5–5.1)
Sodium: 140 mEq/L (ref 135–145)
Total Bilirubin: 1.3 mg/dL — ABNORMAL HIGH (ref 0.3–1.2)
Total Protein: 6.3 g/dL (ref 6.0–8.3)

## 2010-10-14 LAB — POCT I-STAT GLUCOSE
Glucose, Bld: 108 mg/dL — ABNORMAL HIGH (ref 70–99)
Glucose, Bld: 122 mg/dL — ABNORMAL HIGH (ref 70–99)
Operator id: 230421
Operator id: 3342

## 2010-10-14 LAB — POCT I-STAT, CHEM 8
BUN: 10 mg/dL (ref 6–23)
Calcium, Ion: 1.07 mmol/L — ABNORMAL LOW (ref 1.12–1.32)
Chloride: 100 mEq/L (ref 96–112)
Creatinine, Ser: 0.6 mg/dL (ref 0.4–1.5)
Glucose, Bld: 351 mg/dL — ABNORMAL HIGH (ref 70–99)
HCT: 23 % — ABNORMAL LOW (ref 39.0–52.0)
Hemoglobin: 7.8 g/dL — ABNORMAL LOW (ref 13.0–17.0)
Potassium: 3.5 mEq/L (ref 3.5–5.1)
Sodium: 136 mEq/L (ref 135–145)
TCO2: 22 mmol/L (ref 0–100)

## 2010-10-14 LAB — MAGNESIUM
Magnesium: 2 mg/dL (ref 1.5–2.5)
Magnesium: 2.3 mg/dL (ref 1.5–2.5)

## 2010-10-14 LAB — ABO/RH: ABO/RH(D): A NEG

## 2010-10-14 LAB — PREPARE FRESH FROZEN PLASMA

## 2010-10-14 LAB — APTT
aPTT: 30 seconds (ref 24–37)
aPTT: 46 seconds — ABNORMAL HIGH (ref 24–37)

## 2010-10-14 LAB — PLATELET COUNT: Platelets: 53 10*3/uL — ABNORMAL LOW (ref 150–400)

## 2010-10-14 LAB — HEMOGLOBIN AND HEMATOCRIT, BLOOD
HCT: 28 % — ABNORMAL LOW (ref 39.0–52.0)
Hemoglobin: 9.7 g/dL — ABNORMAL LOW (ref 13.0–17.0)

## 2010-11-08 ENCOUNTER — Telehealth: Payer: Self-pay | Admitting: Family Medicine

## 2010-11-08 DIAGNOSIS — E039 Hypothyroidism, unspecified: Secondary | ICD-10-CM

## 2010-11-08 MED ORDER — LEVOTHYROXINE SODIUM 175 MCG PO TABS: 175.0000 ug | ORAL_TABLET | Freq: Every day | ORAL | Status: DC

## 2010-11-08 NOTE — Telephone Encounter (Signed)
Pt informed she needs return OV before any more refills.  2nd time informed she needs OV

## 2010-11-08 NOTE — Telephone Encounter (Signed)
Pt req refill of Levothyroxine 120mcg. Pt uses [facility] on [address]. Pt called pharmacy to req refill and pharmacy told pt that pt would need to contact the physician for refills.

## 2010-11-10 ENCOUNTER — Other Ambulatory Visit: Payer: Self-pay | Admitting: Cardiology

## 2010-11-10 DIAGNOSIS — I2510 Atherosclerotic heart disease of native coronary artery without angina pectoris: Secondary | ICD-10-CM

## 2010-12-13 NOTE — Assessment & Plan Note (Signed)
OFFICE VISIT   Bradley, [name]  DOB:  09-19-1955                                        April 05, 2010  CHART #:  [id number]   HISTORY:  The patient is a 55 year old male who is now status post an  aortic root replacement with Valsalva graft and 23-mm Edwards  [employer] pericardial tissue valve Magna Ease with circulatory  arrest.  This was done for his critical aortic stenosis with moderate  aortic insufficiency status post previous open aortic valvulotomy as a  child.  The patient currently reports that he is actually feeling quite  well.  He denies significant shortness of breath.  He is having minimal  pain.  His activity level is increasing and he is tolerating activities  well.  He was seen today by Cardiology.  They plan to stop his Pacerone  when it runs out and to continue him on his current Lopressor and  ramipril dosing.  He denies fevers, chills, or other constitutional  symptoms.   PHYSICAL EXAM:  Vital Signs:  Blood pressure is 134/79, pulse is 68,  respirations 16, oxygen saturation is 98% on room air.  General  Appearance:  He is a well-developed adult male in no acute distress.  Cardiac examination reveals a 2/6 systolic murmur.  Regular rate and  rhythm.  Pulmonary examination reveals clear breath sounds throughout.  Extremities reveals no edema.  Incisions are healing well without  evidence of infection.   Chest x-ray was obtained on today's date.  It reveals clear lung fields.  There are no significant infiltrates, evidence of congestive failure, or  effusions.   ASSESSMENT:  The patient is making excellent ongoing progress.  He will  be continued to be managed long term by the cardiologist and we will see  him on a p.r.n. basis.  He does report  some issues related to urination and I have advised him to follow up  with his primary physician  and possibly Urology as they see fit.  I have encouraged him to begin  driving per  our standard protocols.  He will continue lifting  restrictions.  We will see him again on a p.r.n. basis.   [name], P.A.-C.   Bradley [name]  D:  04/05/2010  T:  04/06/2010  Job:  [id number]

## 2010-12-28 ENCOUNTER — Encounter: Payer: Self-pay | Admitting: Cardiology

## 2010-12-29 ENCOUNTER — Encounter: Payer: Self-pay | Admitting: Cardiology

## 2010-12-29 ENCOUNTER — Ambulatory Visit (INDEPENDENT_AMBULATORY_CARE_PROVIDER_SITE_OTHER): Payer: Self-pay | Admitting: Cardiology

## 2010-12-29 DIAGNOSIS — Z954 Presence of other heart-valve replacement: Secondary | ICD-10-CM

## 2010-12-29 DIAGNOSIS — Z952 Presence of prosthetic heart valve: Secondary | ICD-10-CM | POA: Insufficient documentation

## 2010-12-29 DIAGNOSIS — I10 Essential (primary) hypertension: Secondary | ICD-10-CM | POA: Insufficient documentation

## 2010-12-29 NOTE — Progress Notes (Signed)
Subjective:   History Do had a Bentall procedure of placement of a #23 mm pericardial valve and a #28 Hemashield  graft back in August of 2011.he had a history of normal coronary arteries. He has had mild decrease in LV function but overall he's been satisfactory.  History of thyroid cancer and prior thyroidectomy in 2005.  He was climbing a tree and fell approximately 15 feet and has injury to his right shoulder and low back. Followup will be with Dr. Elease Hashimoto.  He has a history of hypertension that has resolved with current medicine and now with reducing dosages.  Current Outpatient Prescriptions  Medication Sig Dispense Refill  . aspirin 325 MG tablet Take 325 mg by mouth daily.        . fish oil-omega-3 fatty acids 1000 MG capsule Take 1 g by mouth daily.        Marland Kitchen levothyroxine (SYNTHROID, LEVOTHROID) 175 MCG tablet Take 1 tablet (175 mcg total) by mouth daily.  30 tablet  0  . lisinopril-hydrochlorothiazide (PRINZIDE,ZESTORETIC) 10-12.5 MG per tablet Take 1 tablet by mouth daily.        . metoprolol tartrate (LOPRESSOR) 25 MG tablet TAKE ONE-HALF TABLET BY MOUTH TWICE DAILY  30 tablet  5  . multivitamin (THERAGRAN) per tablet Take 1 tablet by mouth daily.        . Oxycodone-Acetaminophen (MAGNACET PO) Take 1 tablet by mouth daily.          No Known Allergies  Patient Active Problem List  Diagnoses  . HYPOTHYROIDISM  . DEPRESSION  . WEIGHT LOSS  . CHEST PAIN, EXERTIONAL    History  Smoking status  . Never Smoker   Smokeless tobacco  . Never Used    History  Alcohol Use No    Family History  Problem Relation Age of Onset  . Heart disease Father     Review of Systems:   The patient denies any heat or cold intolerance.  No weight gain or weight loss.  The patient denies headaches or blurry vision.  There is no cough or sputum production.  The patient denies dizziness.  There is no hematuria or hematochezia.  The patient denies any muscle aches or arthritis.  The  patient denies any rash.  The patient denies frequent falling or instability.  There is no history of depression or anxiety.  All other systems were reviewed and are negative.   Physical Exam:   Blood pressure is 112/70 sitting heart rate 68. Weights 197. He has a harsh grade 3/6 systolic outflow murmur. There is decreased range of motion of his right shoulder. Lungs are clear. Heart shows regular rate and rhythm with a grade 3/6 outflow murmur. Abdomen soft nontender. Extremities are without edema. Assessment / Plan:

## 2010-12-29 NOTE — Assessment & Plan Note (Signed)
Hypertension has progressively improved since his aortic valve replacement. I will discontinue his hydrochlorothiazide and continue lisinopril 10 mg per day.

## 2010-12-29 NOTE — Assessment & Plan Note (Signed)
Overall, I think his aortic valve replacement satisfactory. We'll decrease his antihypertensives is still only lisinopril 10 mg a day because of intermittent lightheadedness. He does have residual murmur. 2-D echocardiogram in September 2011 shows satisfactory aortic valve area a mean gradient of 21 mm mercury.

## 2011-01-10 ENCOUNTER — Encounter: Payer: Self-pay | Admitting: Family Medicine

## 2011-01-10 ENCOUNTER — Ambulatory Visit (INDEPENDENT_AMBULATORY_CARE_PROVIDER_SITE_OTHER): Payer: Self-pay | Admitting: Family Medicine

## 2011-01-10 VITALS — BP 122/72 | Temp 98.0°F | Wt 203.0 lb

## 2011-01-10 DIAGNOSIS — Z954 Presence of other heart-valve replacement: Secondary | ICD-10-CM

## 2011-01-10 DIAGNOSIS — E039 Hypothyroidism, unspecified: Secondary | ICD-10-CM

## 2011-01-10 DIAGNOSIS — Z952 Presence of prosthetic heart valve: Secondary | ICD-10-CM

## 2011-01-10 DIAGNOSIS — M25519 Pain in unspecified shoulder: Secondary | ICD-10-CM

## 2011-01-10 LAB — TSH: TSH: 1.07 u[IU]/mL (ref 0.35–5.50)

## 2011-01-10 NOTE — Progress Notes (Addendum)
  Subjective:    Patient ID: Bradley [name], male    DOB: 26-Feb-1956, 55 y.o.   MRN: [id number]  HPI Patient seen with right shoulder pain. Present for least 6 weeks after falling out of a tree. Had R shoulder pushed back against tree on way down but did not recall any dislocation.  Constant pain since then. Pain especially at night. Pain and weakness with internal rotation and abduction. Also pain with external rotation. He's had some fleeting back pain and also some low back pain for which he is seeing chiropractor. Occasional fleeting pains to right upper extremity to the hand. He tried ibuprofen with minimal relief  History of hypothyroidism. Needs repeat TSH. Comply with therapy.  Recent weight gain since his heart surgery.  History of severe aortic valve stenosis with replacement last August. His done well since then. History of left ventricular dysfunction. Follow closely by cardiology. No recent chest pain or dyspnea   Review of Systems  Constitutional: Positive for unexpected weight change. Negative for fever, activity change, appetite change and fatigue.  Respiratory: Negative for shortness of breath.   Cardiovascular: Negative for chest pain, palpitations and leg swelling.  Musculoskeletal: Positive for back pain and arthralgias.  Skin: Negative for rash.  Neurological: Negative for weakness.       Objective:   Physical Exam  Constitutional: He appears well-developed and well-nourished.  Cardiovascular: Normal rate and regular rhythm.   Murmur heard. Pulmonary/Chest: Effort normal and breath sounds normal. No respiratory distress. He has no wheezes. He has no rales.  Musculoskeletal: He exhibits no edema.       Patient has definite weakness with external rotation right shoulder and also with abduction. Actively he can abduct shoulder about 100 but has difficulty beyond that. No biceps tenderness.  No obvious muscle atrophy.  Neurological:       Patient has weakness with  external rotation and abduction.  Deep tendon reflexes intact. Sensory function intact.          Assessment & Plan:  #1 right shoulder pain and weakness. Suspect rotator cuff tear. Scheduled MRI to evaluate #2 hypothyroidism recheck TSH  Rotator cuff tear confirmed by MRI.  Pt aware.  Orthopedic referral.

## 2011-01-11 NOTE — Progress Notes (Signed)
Quick Note:  Pt informed ______ 

## 2011-01-12 ENCOUNTER — Ambulatory Visit
Admission: RE | Admit: 2011-01-12 | Discharge: 2011-01-12 | Disposition: A | Discharge status: Home / Self Care | Payer: Self-pay | Source: Ambulatory Visit | Attending: Family Medicine | Admitting: Family Medicine

## 2011-01-12 DIAGNOSIS — M25519 Pain in unspecified shoulder: Secondary | ICD-10-CM

## 2011-01-13 NOTE — Progress Notes (Signed)
Addended by: [name] on: 01/13/2011 05:55 PM   Modules accepted: Orders

## 2011-02-03 ENCOUNTER — Telehealth: Payer: Self-pay | Admitting: Family Medicine

## 2011-02-03 NOTE — Telephone Encounter (Signed)
VM full, unable to leave a VM, will try again on Monday

## 2011-02-03 NOTE — Telephone Encounter (Signed)
He has rotator cuff tear and definitely needs ortho referral.  Let's try [facility].  He is getting some assistance from [facility] for his medical costs.

## 2011-02-03 NOTE — Telephone Encounter (Signed)
Pt called and is needing to get a referral to orthopedic doctor re: shoulder, for torn ligaments. Pt was set up for an appt with [facility], but they refused to see pt until an outstanding bill gets pd. Pt is wondering if there is another Orthopedic place that Dr Elease Hashimoto could recommend?

## 2011-02-03 NOTE — Telephone Encounter (Signed)
Please advise 

## 2011-02-07 NOTE — Telephone Encounter (Signed)
Pt informed on mobile VM

## 2011-02-10 ENCOUNTER — Other Ambulatory Visit: Payer: Self-pay | Admitting: Family Medicine

## 2011-02-17 ENCOUNTER — Ambulatory Visit (INDEPENDENT_AMBULATORY_CARE_PROVIDER_SITE_OTHER): Payer: Self-pay | Admitting: Family Medicine

## 2011-02-17 ENCOUNTER — Encounter: Payer: Self-pay | Admitting: Family Medicine

## 2011-02-17 VITALS — BP 130/90 | Temp 98.6°F | Wt 200.0 lb

## 2011-02-17 DIAGNOSIS — S43429A Sprain of unspecified rotator cuff capsule, initial encounter: Secondary | ICD-10-CM

## 2011-02-17 DIAGNOSIS — L84 Corns and callosities: Secondary | ICD-10-CM

## 2011-02-17 DIAGNOSIS — K4090 Unilateral inguinal hernia, without obstruction or gangrene, not specified as recurrent: Secondary | ICD-10-CM

## 2011-02-17 DIAGNOSIS — M75101 Unspecified rotator cuff tear or rupture of right shoulder, not specified as traumatic: Secondary | ICD-10-CM

## 2011-02-17 DIAGNOSIS — E039 Hypothyroidism, unspecified: Secondary | ICD-10-CM

## 2011-02-17 MED ORDER — LEVOTHYROXINE SODIUM 175 MCG PO TABS: 175.0000 ug | ORAL_TABLET | Freq: Every day | ORAL | Status: DC

## 2011-02-17 NOTE — Progress Notes (Signed)
  Subjective:    Patient ID: Bradley [name], male    DOB: 03/29/1956, 55 y.o.   MRN: [id number]  HPI Callus left foot with problems off and on for a few years. Previously trimmed by a podiatrist. Has tried various topical pads without much relief. No recent change of shoe wear. Very painful to walk. No history of ulceration. Nondiabetic. He has tried filing this himself without much improvement  Questionable right inguinal hernia. Recent bulge noted. Nonpainful. Increased with straining.  Hypothyroidism. Recent TSH normal. Requesting refill levothyroxin.  Severe right rotator cuff tear. He is trying to get back in to see orthopedist at this time.  Confirmed by recent MRI scan. Still has considerable light time pain   Review of Systems  Constitutional: Negative for appetite change and unexpected weight change.  Respiratory: Negative for cough and shortness of breath.   Cardiovascular: Negative for chest pain, palpitations and leg swelling.  Musculoskeletal: Positive for arthralgias.  Neurological: Negative for dizziness, syncope, weakness and headaches.  Hematological: Negative for adenopathy.       Objective:   Physical Exam  Constitutional: He is oriented to person, place, and time. He appears well-developed and well-nourished.  HENT:  Head: Normocephalic and atraumatic.  Mouth/Throat: Oropharynx is clear and moist.  Eyes: Pupils are equal, round, and reactive to light.  Neck: Neck supple. No thyromegaly present.  Cardiovascular: Normal rate and regular rhythm.   Pulmonary/Chest: Effort normal and breath sounds normal. No respiratory distress. He has no wheezes. He has no rales.  Genitourinary:       Patient has small right inguinal hernia. Nontender.  Testes normal  Musculoskeletal: He exhibits no edema.       Left foot reveals large callus/corn. No ulceration. No evidence for plantar wart.  Location is near center of ball of foot  Lymphadenopathy:    He has no cervical  adenopathy.  Neurological: He is alert and oriented to person, place, and time.          Assessment & Plan:  #1 callus left foot. Reviewed risk and benefits of trimming and patient consented. Trimmed callus with #15 blade he tolerated well with immediate relief of pain. He is aware this will likely recur #2 hypothyroidism refill levothyroxin for one year. Recent labs normal  #3 right inguinal hernia. This is small, nonpainful. Observe for now. Eventually will need surgical evaluation

## 2011-03-01 ENCOUNTER — Telehealth: Payer: Self-pay | Admitting: Family Medicine

## 2011-03-01 NOTE — Telephone Encounter (Signed)
Going in for right shoulder surgery on 03-17-2011. Would like to speak with Izora Gala.

## 2011-03-01 NOTE — Telephone Encounter (Signed)
VM left for pt encouraging him to leave a more detailed message when he calls back as to how we can assist him

## 2011-03-06 NOTE — Telephone Encounter (Signed)
Pt informed on personally identified VM 

## 2011-03-06 NOTE — Telephone Encounter (Signed)
Would recommend over the counter benadryl if he has not tried yet.

## 2011-03-06 NOTE — Telephone Encounter (Signed)
Pt left another VM requesting a "pretty strong sleeping pill". [place]

## 2011-03-16 ENCOUNTER — Encounter (HOSPITAL_BASED_OUTPATIENT_CLINIC_OR_DEPARTMENT_OTHER)
Admission: RE | Admit: 2011-03-16 | Discharge: 2011-03-16 | Disposition: A | Discharge status: Home / Self Care | Payer: Self-pay | Source: Ambulatory Visit | Attending: Orthopedic Surgery | Admitting: Orthopedic Surgery

## 2011-03-16 ENCOUNTER — Telehealth: Payer: Self-pay | Admitting: Cardiology

## 2011-03-16 LAB — BASIC METABOLIC PANEL
BUN: 17 mg/dL (ref 6–23)
CO2: 32 mEq/L (ref 19–32)
Calcium: 9.5 mg/dL (ref 8.4–10.5)
Chloride: 106 mEq/L (ref 96–112)
Creatinine, Ser: 0.77 mg/dL (ref 0.50–1.35)
GFR calc Af Amer: >60 mL/min (ref >60)
GFR calc non Af Amer: >60 mL/min (ref >60)
Glucose, Bld: 93 mg/dL (ref 70–99)
Potassium: 4.4 mEq/L (ref 3.5–5.1)
Sodium: 142 mEq/L (ref 135–145)

## 2011-03-16 NOTE — Telephone Encounter (Signed)
Faxed last OV Note, Stress Echo, and EKG today

## 2011-03-16 NOTE — Telephone Encounter (Signed)
Call Back Phone#: [phone] Last OV, EKG, STRESS

## 2011-03-17 ENCOUNTER — Ambulatory Visit (HOSPITAL_BASED_OUTPATIENT_CLINIC_OR_DEPARTMENT_OTHER)
Admission: RE | Admit: 2011-03-17 | Discharge: 2011-03-17 | Disposition: A | Discharge status: Home / Self Care | Payer: Self-pay | Source: Ambulatory Visit | Attending: Orthopedic Surgery | Admitting: Orthopedic Surgery

## 2011-03-17 DIAGNOSIS — M19019 Primary osteoarthritis, unspecified shoulder: Secondary | ICD-10-CM | POA: Insufficient documentation

## 2011-03-17 DIAGNOSIS — M25819 Other specified joint disorders, unspecified shoulder: Secondary | ICD-10-CM | POA: Insufficient documentation

## 2011-03-17 DIAGNOSIS — F3289 Other specified depressive episodes: Secondary | ICD-10-CM | POA: Insufficient documentation

## 2011-03-17 DIAGNOSIS — E039 Hypothyroidism, unspecified: Secondary | ICD-10-CM | POA: Insufficient documentation

## 2011-03-17 DIAGNOSIS — F329 Major depressive disorder, single episode, unspecified: Secondary | ICD-10-CM | POA: Insufficient documentation

## 2011-03-17 DIAGNOSIS — Z5333 Arthroscopic surgical procedure converted to open procedure: Secondary | ICD-10-CM | POA: Insufficient documentation

## 2011-03-17 DIAGNOSIS — Z01812 Encounter for preprocedural laboratory examination: Secondary | ICD-10-CM | POA: Insufficient documentation

## 2011-03-17 DIAGNOSIS — M719 Bursopathy, unspecified: Secondary | ICD-10-CM | POA: Insufficient documentation

## 2011-03-17 DIAGNOSIS — M67919 Unspecified disorder of synovium and tendon, unspecified shoulder: Secondary | ICD-10-CM | POA: Insufficient documentation

## 2011-03-17 LAB — POCT HEMOGLOBIN-HEMACUE: Hemoglobin: 14.3 g/dL (ref 13.0–17.0)

## 2011-03-19 NOTE — Op Note (Signed)
NAME:  Bradley [name], Bradley [name]               ACCOUNT NO.:  [id number]  MEDICAL RECORD NO.:  [id number]  LOCATION:                                 FACILITY:  PHYSICIAN:  Johnny Bridge, MD    DATE OF BIRTH:  [birth year]  DATE OF PROCEDURE:  03/17/2011 DATE OF DISCHARGE:                              OPERATIVE REPORT   PREOPERATIVE DIAGNOSIS:  Right shoulder massive rotator cuff tear involving supraspinatus, subscapularis, infraspinatus, with biceps tendinosis and impingement with acromioclavicular joint arthritis.  POSTOPERATIVE DIAGNOSIS:  Right shoulder massive rotator cuff tear involving supraspinatus, subscapularis, infraspinatus, with biceps tendinosis and impingement with acromioclavicular joint arthritis.  OPERATIVE PROCEDURE:  Right shoulder arthroscopy with rotator cuff repair of the infraspinatus, supraspinatus, and subscapularis, with acromioplasty, distal clavicle resection, and open suprapectoral biceps tenodesis.  PREOPERATIVE INDICATIONS:  [name] is a 55 year old gentleman who had a massive rotator cuff tear that was not clear exactly the time course, however, he had ongoing persistent pain in the shoulder with disabling function and was not able to lift his arm and elected to undergo the above named procedures.  The risks, benefits, and alternatives were discussed before the procedure including but not limited to risks of infection, bleeding, nerve injury, recurrent rotator cuff tear, inability to achieve a complete repair, stiffness, progression of arthritis, cardiopulmonary complications, among others, and he was willing to proceed.  ATTENDING SURGEON:  Myself.  FIRST ASSISTANT:  Joya Gaskins, orthopedic PA-C present and scrubbed throughout the case.  OPERATIVE PROCEDURE:  The patient was brought to the operating room and placed in supine position.  IV antibiotics were given.  General anesthesia was administered.  Regional block was also given.  The  right upper extremity was prepped and draped in the usual sterile fashion.  He was in a semilateral decubitus position and all bony prominences were padded.  After a sterile prep and drape and a time-out to the right upper extremity, I made a posterior incision and performed a diagnostic arthroscopy.  The articular surface of the glenohumeral joint was in reasonably good condition.  The subscapularis was torn, and the biceps tendon had severe tendinopathy.  The supraspinatus was also torn and had extreme retraction.  The infraspinatus was also torn.  I performed a biceps tenolysis at this point and then debrided the labrum and prepared the subscapularis.  I placed a horizontal mattress using the inverted fiber tape technique through the subscapularis using a 45 degree suture lasso.  I then used a 4.75 mm Bio-Composite swivel lock into the head. This provided appropriate fixation.  The bone quality itself was not great.  Once I had repaired the subscapularis, I turned my attention to subacromial space.  I used the arthroscopic elevator to mobilize the rotator cuff tear. This was in a bileaflet configuration.  The inferior and more substantial leaflet was retracted back almost to the level of the glenoid.  The infraspinatus was also torn and retracted back.  I mobilized the tear, and then placed a horizontal mattress stitch through the infraspinatus capturing the deep leaflet.  I then brought this up onto the head after preparing the tuberosity with a bur.  I advanced the  tendon and was able to achieve a single row repair getting the tendon back to the articular margin.  This was fairly tight, however, and I did my best to bring it over without undue tension.  Nonetheless, this was a massive tear.  Once I had completed this, I then used the __________ portal to pass a horizontal mattress stitch with an inverted fiber tape through the supraspinatus portion of the tear.  This was then  brought into a second swivel lock more anteriorly.  This was placed through a separate portal. This brought the anterior portion of the tear to the articular surface as well.  I used the sutures that the #2 FiberWire that were coming out of the swivel locks in order to capture additional hold on the tendon and bring any loose leaflets down to bone.  These were passed in a simple manner and then tied.  I then had adequate repair of the tendon. Overall bone quality was mediocre and tendon quality was mediocre to poor.  Nonetheless I did grab the deep tissue, so hopefully this tissue will hold.  I then used the ArthroCare and released the CA ligament and performed a acromioplasty and brought this down to a smooth surface.  There was a mild subacromial spur.  I then used the ArthroCare and prepared the distal clavicle and resected the distal clavicle leaving adequate space. Confirmation for multiple views was made.  I then removed the arthroscopic instruments and turned my attention to the biceps.  A small incision was made proximal near the axillary fold and the deltopectoral approach was utilized and the cephalic vein was retracted laterally.  I exposed the biceps tendon and brought it out of the groove.  I then placed a 5.5-mm PEEK anchor into the groove at the distal most portion and tensioned the biceps and then placed the sutures from the anchor through the biceps tendon and then tied this down. Excellent fixation was achieved.  I then irrigated the wounds copiously and closed the subcutaneous tissue with Vicryl and Monocryl for the portals and the biceps tenodesis site.  The patient was then awakened and extubated and returned to the PACU in stable and satisfactory condition.  There were no complications and he tolerated the procedure well.     Johnny Bridge, MD     JPL/MEDQ  D:  03/17/2011  T:  03/17/2011  Job:  [id number]  Electronically Signed by Marchia Bond MD on  03/19/2011 03:50:46 PM

## 2011-04-13 ENCOUNTER — Encounter: Payer: Self-pay | Admitting: Family Medicine

## 2011-04-13 ENCOUNTER — Ambulatory Visit (INDEPENDENT_AMBULATORY_CARE_PROVIDER_SITE_OTHER): Payer: Self-pay | Admitting: Family Medicine

## 2011-04-13 VITALS — BP 140/88 | Temp 98.6°F | Wt 194.0 lb

## 2011-04-13 DIAGNOSIS — R110 Nausea: Secondary | ICD-10-CM

## 2011-04-13 DIAGNOSIS — F329 Major depressive disorder, single episode, unspecified: Secondary | ICD-10-CM

## 2011-04-13 DIAGNOSIS — G4700 Insomnia, unspecified: Secondary | ICD-10-CM

## 2011-04-13 DIAGNOSIS — F3289 Other specified depressive episodes: Secondary | ICD-10-CM

## 2011-04-13 DIAGNOSIS — M25519 Pain in unspecified shoulder: Secondary | ICD-10-CM

## 2011-04-13 DIAGNOSIS — F32A Depression, unspecified: Secondary | ICD-10-CM

## 2011-04-13 MED ORDER — TRAMADOL HCL 50 MG PO TABS: ORAL_TABLET | ORAL | Status: DC

## 2011-04-13 MED ORDER — SERTRALINE HCL 50 MG PO TABS: 50.0000 mg | ORAL_TABLET | Freq: Every day | ORAL | Status: DC

## 2011-04-13 NOTE — Patient Instructions (Signed)
Be sure to follow up with Dr Mardelle Matte if shoulder pain not adequately controlled with medication (tramadol).

## 2011-04-13 NOTE — Progress Notes (Signed)
  Subjective:    Patient ID: Bradley [name], male    DOB: 04-21-1956, 55 y.o.   MRN: [id number]  HPI Recent surgery right rotator cuff repair. Significant postoperative pain. Placed by orthopedist on Percocet 10 mg and taking up to 2 every 6 hours and at one point was taking up to 10 per day. Still has significant right shoulder pain but slowly improving. Since starting the Percocet he had some itching but no rash. He has intermittent nausea and bodyaches has had difficulty sleeping. Over the past couple days he has reduced the Percocet and symptoms have actually slightly improved. He had some constipation which likewise has improved with reducing dosage. He would like to explore other options at this point.  Patient also relates some progressive depression symptoms over several months. This preceded his surgery. No suicidal ideation.  Low motivation and has some early morning awakening again even prior to surgery. No prior history of depression. Lost of interest in activities. No alcohol use. No illicit drug use.   Review of Systems  Constitutional: Negative for fever and chills.  Respiratory: Negative for cough and shortness of breath.   Cardiovascular: Negative for chest pain, palpitations and leg swelling.  Gastrointestinal: Positive for nausea. Negative for vomiting.  Genitourinary: Negative for dysuria.  Musculoskeletal: Positive for arthralgias.  Neurological: Negative for dizziness and headaches.  Psychiatric/Behavioral: Positive for dysphoric mood.       Objective:   Physical Exam  Constitutional: He is oriented to person, place, and time. He appears well-developed and well-nourished.  HENT:  Mouth/Throat: Oropharynx is clear and moist.  Neck: Neck supple.  Cardiovascular: Normal rate and regular rhythm.   Murmur heard. Pulmonary/Chest: Effort normal and breath sounds normal. No respiratory distress. He has no wheezes. He has no rales.  Abdominal: Soft. There is no tenderness.    Musculoskeletal:       Right shoulder and upper extremity in sling  Lymphadenopathy:    He has no cervical adenopathy.  Neurological: He is alert and oriented to person, place, and time. No cranial nerve deficit.  Psychiatric:       Tearful off and on during interview          Assessment & Plan:  #1 multiple symptoms including constipation, nausea, fatigue, insomnia possibly related to Percocet. He would discontinue Percocet and we have agreed to trial of tramadol 50 mg one to 2 every 6 hours as needed. If insufficient pain control followup with orthopedist. #2 Maj. depressive episode. Discussed possible counseling. Start sertraline 50 mg once daily and reassess 3 weeks #3 recent right rotator cuff repair

## 2011-04-19 ENCOUNTER — Telehealth: Payer: Self-pay | Admitting: Cardiovascular Disease

## 2011-04-19 NOTE — Telephone Encounter (Signed)
Former pt of [facility], having high bp and dizziness x 1 wk, due in nov, concerned he should be seen sooner, requesting nurse call, can also reach at cell [phone]

## 2011-04-19 NOTE — Telephone Encounter (Signed)
Spoke with pt, about 6 weeks ago he had shoulder surgery and recently saw his medical doctor due to not tolerating the pain pills. At the time of that office visit his bp was 123/97. He checked his bp last night at [facility] and it was 126/90. He is concerned about the bottom number running high. meds confirmed in his chart. Follow up appt made for pt to see dr Johnsie Cancel in oct. Will forward for dr Johnsie Cancel review for any med changes prior to appt Fredia Beets

## 2011-04-23 NOTE — Telephone Encounter (Signed)
Make zestoretic 20/12.5

## 2011-04-25 NOTE — Telephone Encounter (Signed)
Lmtcb./cy

## 2011-04-26 NOTE — Telephone Encounter (Signed)
LEFT MESSAGE ON HOME NUMBER ATTEMPTED TO CALL CELL UNABLE  TO LEAVE MESSAGE MAILBOX FULL./CY

## 2011-05-02 ENCOUNTER — Telehealth: Payer: Self-pay | Admitting: Cardiovascular Disease

## 2011-05-02 NOTE — Telephone Encounter (Signed)
See previous phone note Bradley [name]

## 2011-05-02 NOTE — Telephone Encounter (Signed)
Pt returning your call about BP medication

## 2011-05-04 ENCOUNTER — Ambulatory Visit (INDEPENDENT_AMBULATORY_CARE_PROVIDER_SITE_OTHER): Payer: Self-pay | Admitting: Family Medicine

## 2011-05-04 ENCOUNTER — Encounter: Payer: Self-pay | Admitting: Family Medicine

## 2011-05-04 DIAGNOSIS — F3289 Other specified depressive episodes: Secondary | ICD-10-CM

## 2011-05-04 DIAGNOSIS — F329 Major depressive disorder, single episode, unspecified: Secondary | ICD-10-CM

## 2011-05-04 DIAGNOSIS — M75101 Unspecified rotator cuff tear or rupture of right shoulder, not specified as traumatic: Secondary | ICD-10-CM

## 2011-05-04 DIAGNOSIS — S43429A Sprain of unspecified rotator cuff capsule, initial encounter: Secondary | ICD-10-CM

## 2011-05-04 NOTE — Progress Notes (Signed)
  Subjective:    Patient ID: Bradley [name], male    DOB: [birth year], 55 y.o.   MRN: [id number]  HPI Followup major depressive episode. Started sertraline 50 mg and great improvement especially the past week. Still some insomnia issues but mood is much better.   Fewer crying spells. Much improved motivation. He is starting to get more engaged in activities. No suicidal ideation. He has tapered off Percocet for his shoulder pain from recent surgery. Requesting arm sling today as his current one is worn out. He has not yet started physical therapy.   Review of Systems  Constitutional: Negative for appetite change.  Respiratory: Negative for cough and shortness of breath.   Cardiovascular: Negative for chest pain.  Psychiatric/Behavioral: Negative for dysphoric mood and agitation. The patient is not nervous/anxious.        Objective:   Physical Exam  Constitutional: He is oriented to person, place, and time. He appears well-developed and well-nourished.  Cardiovascular: Normal rate and regular rhythm.   Pulmonary/Chest: Effort normal and breath sounds normal. No respiratory distress. He has no wheezes. He has no rales.  Neurological: He is alert and oriented to person, place, and time.  Psychiatric: His behavior is normal. Judgment and thought content normal.       Mood is much brighter today. He is much more animated.          Assessment & Plan:  Maj. depressive episode. Improved. Continue sertraline minimum 6-9 months. Recent right shoulder surgery for rotator cuff tear. Patient requesting arm sling which he uses as needed and we will try to provide. Continue followup with orthopedist

## 2011-05-04 NOTE — Patient Instructions (Signed)
Consider taking Sertraline for at least 6 to 9 months.

## 2011-05-05 ENCOUNTER — Encounter: Payer: Self-pay | Admitting: Cardiovascular Disease

## 2011-05-05 ENCOUNTER — Ambulatory Visit (INDEPENDENT_AMBULATORY_CARE_PROVIDER_SITE_OTHER): Payer: Self-pay | Admitting: Cardiovascular Disease

## 2011-05-05 DIAGNOSIS — M75101 Unspecified rotator cuff tear or rupture of right shoulder, not specified as traumatic: Secondary | ICD-10-CM

## 2011-05-05 DIAGNOSIS — F32A Depression, unspecified: Secondary | ICD-10-CM

## 2011-05-05 DIAGNOSIS — F329 Major depressive disorder, single episode, unspecified: Secondary | ICD-10-CM

## 2011-05-05 DIAGNOSIS — F3289 Other specified depressive episodes: Secondary | ICD-10-CM

## 2011-05-05 DIAGNOSIS — Z952 Presence of prosthetic heart valve: Secondary | ICD-10-CM

## 2011-05-05 DIAGNOSIS — I712 Thoracic aortic aneurysm, without rupture, unspecified: Secondary | ICD-10-CM

## 2011-05-05 DIAGNOSIS — I10 Essential (primary) hypertension: Secondary | ICD-10-CM

## 2011-05-05 DIAGNOSIS — E039 Hypothyroidism, unspecified: Secondary | ICD-10-CM

## 2011-05-05 DIAGNOSIS — I447 Left bundle-branch block, unspecified: Secondary | ICD-10-CM

## 2011-05-05 DIAGNOSIS — IMO0001 Reserved for inherently not codable concepts without codable children: Secondary | ICD-10-CM

## 2011-05-05 DIAGNOSIS — I359 Nonrheumatic aortic valve disorder, unspecified: Secondary | ICD-10-CM

## 2011-05-05 NOTE — Assessment & Plan Note (Signed)
Somwhat reactive due to slow recovery from shoulder.  Continue antidepressant

## 2011-05-05 NOTE — Assessment & Plan Note (Signed)
01/10/11 TSH normal.  No signs of hypothyroidism.  F/U labs in 6 months

## 2011-05-05 NOTE — Assessment & Plan Note (Signed)
Valve sounds fine.  Needs F/U Aortic MRA and echo to assess root for residual dilatation  SBE prophylaxis

## 2011-05-05 NOTE — Progress Notes (Signed)
Previous patient of Dr Doreatha Lew. Had a Bentall procedure of placement of a #23 mm pericardial valve and a #28 Hemashield graft back in August of 2011.he had a history of normal coronary arteries. He has had mild decrease in LV function but overall he's been satisfactory.   Needs F/U for aortic root.  Tough recovery from "massively" torn right rotator cuff.  Needs PT/OT  History of thyroid cancer and prior thyroidectomy in 2005.  He was climbing a tree and fell approximately 15 feet and was operated on by Dr Mardelle Matte  He has a history of hypertension that has been Rx with lopresser  ROS: Denies fever, malais, weight loss, blurry vision, decreased visual acuity, cough, sputum, SOB, hemoptysis, pleuritic pain, palpitaitons, heartburn, abdominal pain, melena, lower extremity edema, claudication, or rash.  All other systems reviewed and negative  General: Affect appropriate Healthy:  appears stated age 55: normal Neck supple with no adenopathy JVP normal no bruits no thyromegaly Lungs clear with no wheezing and good diaphragmatic motion Heart:  S1/S2 prominent  systolic murmur,rub, gallop or click PMI normal Abdomen: benighn, BS positve, no tenderness, no AAA no bruit.  No HSM or HJR Distal pulses intact with no bruits No edema Neuro non-focal Skin warm and dry S/P right shoulder surgery with marked limitation in motion   Current Outpatient Prescriptions  Medication Sig Dispense Refill  . aspirin 325 MG tablet Take 325 mg by mouth daily.        . fish oil-omega-3 fatty acids 1000 MG capsule Take 1 g by mouth daily.        Marland Kitchen levothyroxine (SYNTHROID, LEVOTHROID) 175 MCG tablet Take 1 tablet (175 mcg total) by mouth daily.  30 tablet  11  . lisinopril-hydrochlorothiazide (PRINZIDE,ZESTORETIC) 10-12.5 MG per tablet Take 1 tablet by mouth daily.        . methocarbamol (ROBAXIN) 500 MG tablet Take 500 mg by mouth 3 (three) times daily.        . metoprolol tartrate (LOPRESSOR) 25 MG tablet  TAKE ONE-HALF TABLET BY MOUTH TWICE DAILY  30 tablet  5  . multivitamin (THERAGRAN) per tablet Take 1 tablet by mouth daily.        . sertraline (ZOLOFT) 50 MG tablet Take 1 tablet (50 mg total) by mouth daily.  30 tablet  5  . traMADol (ULTRAM) 50 MG tablet 1-2 po 6 hours prn  120 tablet  1    Allergies  Review of patient's allergies indicates no known allergies.  Electrocardiogram:  NSR 69 ICLBBB no change from 10/11  Assessment and Plan

## 2011-05-05 NOTE — Assessment & Plan Note (Signed)
Chronic.  No change from ECG dated 05/04/10.  Likely related to AV surgery.  No evidence of high grade heart block

## 2011-05-05 NOTE — Assessment & Plan Note (Signed)
Well controlled.  Continue current medications and low sodium Dash type diet.    

## 2011-05-05 NOTE — Patient Instructions (Signed)
Your physician wants you to follow-up in: 6 MONTHS You will receive a reminder letter in the mail two months in advance. If you don't receive a letter, please call our office to schedule the follow-up appointment.  Your physician has requested that you have an echocardiogram. Echocardiography is a painless test that uses sound waves to create images of your heart. It provides your doctor with information about the size and shape of your heart and how well your heart's chambers and valves are working. This procedure takes approximately one hour. There are no restrictions for this procedure.   MRA OF THE CHEST=F/U BENTAL PROCEDURE/AORTIC ROOT REPLACEMENT

## 2011-05-05 NOTE — Assessment & Plan Note (Signed)
Very limite AB/ADuction  At 8 weeks out would think that he would need PT/OT or some sort of physioRx.  Asked him to F/U with Dr Mardelle Matte

## 2011-05-09 ENCOUNTER — Encounter: Payer: Self-pay | Admitting: *Deleted

## 2011-05-09 MED ORDER — LISINOPRIL-HYDROCHLOROTHIAZIDE 20-12.5 MG PO TABS: 1.0000 | ORAL_TABLET | Freq: Every day | ORAL | Status: DC

## 2011-05-09 NOTE — Telephone Encounter (Signed)
Unable to reach pt or leave a message after several attempts. Detailed letter of instructions mailed to pt home Bradley [name]

## 2011-05-26 ENCOUNTER — Other Ambulatory Visit (HOSPITAL_COMMUNITY): Payer: Self-pay

## 2011-06-06 ENCOUNTER — Ambulatory Visit: Payer: Self-pay | Attending: Orthopedic Surgery

## 2011-06-06 DIAGNOSIS — IMO0001 Reserved for inherently not codable concepts without codable children: Secondary | ICD-10-CM | POA: Insufficient documentation

## 2011-06-06 DIAGNOSIS — R5381 Other malaise: Secondary | ICD-10-CM | POA: Insufficient documentation

## 2011-06-06 DIAGNOSIS — M25519 Pain in unspecified shoulder: Secondary | ICD-10-CM | POA: Insufficient documentation

## 2011-06-06 DIAGNOSIS — M25619 Stiffness of unspecified shoulder, not elsewhere classified: Secondary | ICD-10-CM | POA: Insufficient documentation

## 2011-06-06 DIAGNOSIS — M6281 Muscle weakness (generalized): Secondary | ICD-10-CM | POA: Insufficient documentation

## 2011-06-09 ENCOUNTER — Ambulatory Visit: Payer: Self-pay | Admitting: Physical Therapy

## 2011-06-13 ENCOUNTER — Ambulatory Visit: Payer: Self-pay | Admitting: Physical Therapy

## 2011-06-15 ENCOUNTER — Telehealth: Payer: Self-pay | Admitting: *Deleted

## 2011-06-15 ENCOUNTER — Ambulatory Visit (INDEPENDENT_AMBULATORY_CARE_PROVIDER_SITE_OTHER): Payer: Self-pay

## 2011-06-15 ENCOUNTER — Ambulatory Visit: Payer: Self-pay | Admitting: Physical Therapy

## 2011-06-15 VITALS — BP 132/91 | HR 83 | Ht 73.0 in | Wt 191.1 lb

## 2011-06-15 DIAGNOSIS — I10 Essential (primary) hypertension: Secondary | ICD-10-CM

## 2011-06-15 NOTE — Telephone Encounter (Addendum)
Pt walked in with nausea, and was concerned it may have been a medication error made by the Cardiologist.  Advised Dr. Elease Hashimoto not available, but to make sure he goes to his Cardiologist of at least an Urgent Care to make sure he is stable until he can see Dr. Elease Hashimoto next week.  Pt. agreed to do this.  No available MDs in the office today.

## 2011-06-15 NOTE — Progress Notes (Signed)
PT  HAS SEVERAL COMPLAINTS  LEGS COLD , HEADACHE, NAUSEA, AND SOME DIZZINESS HAS NOTED SINCE STARTING THERAPY  ON SHOULDER  MORE PRONOUNCED SINCE 11-12-22 INSTRUCTED PT  TO CONT WITH  CURRENT MEDS AND WILL MONITOR  B/P AT HOME OVER  THE NEXT 7-10  DAYS  AND WILL CALL BACK WITH READINGS . B/P TODAY 132/91 /CY

## 2011-06-16 ENCOUNTER — Ambulatory Visit: Payer: Self-pay

## 2011-06-19 ENCOUNTER — Ambulatory Visit: Payer: Self-pay

## 2011-06-21 ENCOUNTER — Ambulatory Visit: Payer: Self-pay | Admitting: Physical Therapy

## 2011-06-21 NOTE — Telephone Encounter (Signed)
Notified Dr. Elease Hashimoto.  Chart shows that the pt has been in touch with his cardiologist.

## 2011-06-26 ENCOUNTER — Ambulatory Visit: Payer: Self-pay | Admitting: Physical Therapy

## 2011-06-28 ENCOUNTER — Ambulatory Visit: Payer: Self-pay | Admitting: Physical Therapy

## 2011-06-29 ENCOUNTER — Telehealth: Payer: Self-pay | Admitting: Cardiovascular Disease

## 2011-06-29 NOTE — Telephone Encounter (Signed)
Walk In pt Form " Pt Dropped off Adult Disability Report" needs to be completed sent to Message Nurse  06/29/11/km

## 2011-07-03 ENCOUNTER — Ambulatory Visit: Payer: Self-pay | Attending: Orthopedic Surgery

## 2011-07-03 DIAGNOSIS — R5381 Other malaise: Secondary | ICD-10-CM | POA: Insufficient documentation

## 2011-07-03 DIAGNOSIS — M25519 Pain in unspecified shoulder: Secondary | ICD-10-CM | POA: Insufficient documentation

## 2011-07-03 DIAGNOSIS — M6281 Muscle weakness (generalized): Secondary | ICD-10-CM | POA: Insufficient documentation

## 2011-07-03 DIAGNOSIS — IMO0001 Reserved for inherently not codable concepts without codable children: Secondary | ICD-10-CM | POA: Insufficient documentation

## 2011-07-03 DIAGNOSIS — M25619 Stiffness of unspecified shoulder, not elsewhere classified: Secondary | ICD-10-CM | POA: Insufficient documentation

## 2011-07-05 ENCOUNTER — Ambulatory Visit: Payer: Self-pay | Admitting: Physical Therapy

## 2011-07-10 ENCOUNTER — Telehealth: Payer: Self-pay | Admitting: *Deleted

## 2011-07-10 NOTE — Telephone Encounter (Signed)
LM FOR PT TO CALL BACK RE DISABILITY FORMS IS NOT DISABLED FROM CARDIAC STANDPOINT PER DR NISHAN./CY

## 2011-07-11 ENCOUNTER — Ambulatory Visit: Payer: Self-pay

## 2011-07-11 NOTE — Telephone Encounter (Signed)
LMTCB ./CY 

## 2011-07-13 ENCOUNTER — Ambulatory Visit: Payer: Self-pay | Admitting: Physical Therapy

## 2011-07-18 NOTE — Telephone Encounter (Signed)
PT AWARE  DOES NOT QUALIFY FOR DISABILITY FROM CARDIAC STANDPOINT AT THIS TIME . PT C/O  WEAKNESS, FATIGUE , DIZZINESS, AND NO APPETITE. PER PT B/P AT REHAB WAS 118/60.INFORMED PT TO CALL PMD FOR  ABOVE COMPLAINTS. VERBALIZED UNDERSTANDING .Adonis Housekeeper

## 2011-07-18 NOTE — Telephone Encounter (Signed)
LMTCB ./CY 

## 2011-07-19 ENCOUNTER — Ambulatory Visit (INDEPENDENT_AMBULATORY_CARE_PROVIDER_SITE_OTHER): Payer: Self-pay | Admitting: Family Medicine

## 2011-07-19 ENCOUNTER — Encounter: Payer: Self-pay | Admitting: Family Medicine

## 2011-07-19 ENCOUNTER — Telehealth: Payer: Self-pay | Admitting: *Deleted

## 2011-07-19 DIAGNOSIS — Z952 Presence of prosthetic heart valve: Secondary | ICD-10-CM

## 2011-07-19 DIAGNOSIS — R55 Syncope and collapse: Secondary | ICD-10-CM

## 2011-07-19 DIAGNOSIS — Z954 Presence of other heart-valve replacement: Secondary | ICD-10-CM

## 2011-07-19 DIAGNOSIS — I10 Essential (primary) hypertension: Secondary | ICD-10-CM

## 2011-07-19 NOTE — Telephone Encounter (Signed)
VM left for Bradley [name] at [facility], pt has no insurance

## 2011-07-19 NOTE — Patient Instructions (Signed)
Avoid any exertional activity until further investigated by cardiology. Follow up immediately for any syncope.

## 2011-07-19 NOTE — Progress Notes (Signed)
  Subjective:    Patient ID: Bradley [name], male    DOB: 12-23-1955, 55 y.o.   MRN: [id number]  HPI  Patient presents with near syncopal episodes over the past couple weeks. Symptoms are intermittent and occasionally noted after mild exertion such as climbing stairs or following physical therapy. No true syncope. He has a sense of increased heart rate. Has not checked his pulse. Had recent noncontrast echo which was basically unremarkable. No recent chest pain. No vertigo. Problems include hypothyroidism, history of depression, aortic valve replacement, hypertension, and ? left bundle branch block.  Medications reviewed. Blood pressure medications include Lopressor 25 mg one half tablet twice daily and lisinopril HCTZ 20/12.5 one daily. No consistent orthostatic symptoms.  Hydrating well. Denies focal weakness  Past Medical History  Diagnosis Date  . Anxiety   . Depression   . History of thyroid cancer   . Hypertension    Past Surgical History  Procedure Date  . Cardiac catheterization 03/02/2010    NORMAL CORONARY ARTERY  . Sternotomy     REDO  . Transthoracic echocardiogram 03/2010    SHOWED MILD REDUCTION OF LV FUNCTION  . Thyroidectomy     reports that he has never smoked. He has never used smokeless tobacco. He reports that he does not drink alcohol or use illicit drugs. family history includes Heart disease in his father. No Known Allergies   Review of Systems  Constitutional: Negative for fever and chills.  Respiratory: Positive for shortness of breath. Negative for cough and wheezing.   Cardiovascular: Positive for palpitations. Negative for chest pain and leg swelling.  Neurological: Positive for dizziness. Negative for seizures and weakness.       Objective:   Physical Exam  Constitutional: He is oriented to person, place, and time. He appears well-developed and well-nourished.  HENT:  Mouth/Throat: Oropharynx is clear and moist.  Neck: Neck supple. No thyromegaly  present.  Cardiovascular: Normal rate and regular rhythm.   Pulmonary/Chest: Effort normal and breath sounds normal. No respiratory distress. He has no wheezes. He has no rales.  Musculoskeletal: He exhibits no edema.  Neurological: He is alert and oriented to person, place, and time. He has normal reflexes. No cranial nerve deficit. Coordination normal.  Psychiatric: He has a normal mood and affect. His behavior is normal.          Assessment & Plan:  Near syncope in a patient with previous aortic valve replacement. Subjective tachycardia. Patient not orthostatic today. Check EKG. Set up event monitor. Referral back to cardiology. He will suspend physical therapy until evaluated further  EKG sinus rhythm. No acute changes. Set up event monitor and get back in to see cardiology as soon as possible

## 2011-07-19 NOTE — Telephone Encounter (Signed)
Received PC from Rod Holler at [facility].  She states the referral for heart monitor needs pre-certification by our office before they can Schedule pt.

## 2011-07-19 NOTE — Telephone Encounter (Signed)
This patient does not have insurance.  So, no pre-cert would be necessary.

## 2011-08-04 ENCOUNTER — Encounter: Payer: Self-pay | Admitting: Physical Therapy

## 2011-08-07 ENCOUNTER — Ambulatory Visit: Payer: Self-pay | Attending: Orthopedic Surgery | Admitting: Physical Therapy

## 2011-08-07 DIAGNOSIS — R5381 Other malaise: Secondary | ICD-10-CM | POA: Insufficient documentation

## 2011-08-07 DIAGNOSIS — M6281 Muscle weakness (generalized): Secondary | ICD-10-CM | POA: Insufficient documentation

## 2011-08-07 DIAGNOSIS — M25519 Pain in unspecified shoulder: Secondary | ICD-10-CM | POA: Insufficient documentation

## 2011-08-07 DIAGNOSIS — IMO0001 Reserved for inherently not codable concepts without codable children: Secondary | ICD-10-CM | POA: Insufficient documentation

## 2011-08-07 DIAGNOSIS — M25619 Stiffness of unspecified shoulder, not elsewhere classified: Secondary | ICD-10-CM | POA: Insufficient documentation

## 2011-08-09 ENCOUNTER — Ambulatory Visit (INDEPENDENT_AMBULATORY_CARE_PROVIDER_SITE_OTHER): Payer: Self-pay | Admitting: Cardiovascular Disease

## 2011-08-09 ENCOUNTER — Encounter: Payer: Self-pay | Admitting: Cardiovascular Disease

## 2011-08-09 VITALS — BP 130/90 | HR 78 | Ht 73.0 in | Wt 197.0 lb

## 2011-08-09 DIAGNOSIS — Z01810 Encounter for preprocedural cardiovascular examination: Secondary | ICD-10-CM

## 2011-08-09 DIAGNOSIS — F329 Major depressive disorder, single episode, unspecified: Secondary | ICD-10-CM

## 2011-08-09 DIAGNOSIS — I359 Nonrheumatic aortic valve disorder, unspecified: Secondary | ICD-10-CM

## 2011-08-09 DIAGNOSIS — I10 Essential (primary) hypertension: Secondary | ICD-10-CM

## 2011-08-09 DIAGNOSIS — I447 Left bundle-branch block, unspecified: Secondary | ICD-10-CM

## 2011-08-09 DIAGNOSIS — F3289 Other specified depressive episodes: Secondary | ICD-10-CM

## 2011-08-09 DIAGNOSIS — Z952 Presence of prosthetic heart valve: Secondary | ICD-10-CM

## 2011-08-09 DIAGNOSIS — I729 Aneurysm of unspecified site: Secondary | ICD-10-CM

## 2011-08-09 DIAGNOSIS — R55 Syncope and collapse: Secondary | ICD-10-CM

## 2011-08-09 DIAGNOSIS — M75101 Unspecified rotator cuff tear or rupture of right shoulder, not specified as traumatic: Secondary | ICD-10-CM

## 2011-08-09 NOTE — Assessment & Plan Note (Signed)
SEM no AR  Needs chest MRA to assess residual aortic size post Bental.

## 2011-08-09 NOTE — Assessment & Plan Note (Signed)
Well controlled.  Continue current medications and low sodium Dash type diet.    

## 2011-08-09 NOTE — Patient Instructions (Signed)
Your physician wants you to follow-up in: [place] will receive a reminder letter in the mail two months in advance. If you don't receive a letter, please call our office to schedule the follow-up appointment. Your physician recommends that you continue on your current medications as directed. Please refer to the Current Medication list given to you today. Your physician recommends that you return for lab work in: TODAY BMET  DX V72.81  MRA CHEST WITH CONTRAST  DX S/P BENTYL PROCEDURE  AND AORTA ANEURYSM

## 2011-08-09 NOTE — Progress Notes (Signed)
Previous patient of Dr Doreatha Lew. Had a Bentall procedure of placement of a #23 mm pericardial valve and a #28 Hemashield graft back in August of 2011.he had a history of normal coronary arteries. He has had mild decrease in LV function but overall he's been satisfactory.  Needs F/U for aortic root. Tough recovery from "massively" torn right rotator cuff. Needs PT/OT  History of thyroid cancer and prior thyroidectomy in 2005.  He was climbing a tree and fell approximately 15 feet and was operated on by Dr Mardelle Matte  He has a history of hypertension that has been Rx with lopresser  Biggest issue is anxiety.  It incapacitates him at times and leads to multiple somatic complaints including "fainting" paresthesias in legs.  He also gets real Apprehensive and "vagal" with his shoulder rehab.  On Zoloft but this appears to be inadequate  ROS: Denies fever, malais, weight loss, blurry vision, decreased visual acuity, cough, sputum, SOB, hemoptysis, pleuritic pain, palpitaitons, heartburn, abdominal pain, melena, lower extremity edema, claudication, or rash.  All other systems reviewed and negative  General: Anxious male hyperventilates after a while Healthy:  appears stated age 56: normal Neck supple with no adenopathy JVP normal no bruits no thyromegaly Lungs clear with no wheezing and good diaphragmatic motion Heart:  S1/S2systolic  Murmur no ,rub, gallop or click PMI normal Abdomen: benighn, BS positve, no tenderness, no AAA no bruit.  No HSM or HJR Distal pulses intact with no bruits No edema Neuro non-focal Skin warm and dry Right shoulder S/P surgery with continued restricted motion but improved  Current Outpatient Prescriptions  Medication Sig Dispense Refill  . aspirin 325 MG tablet Take 325 mg by mouth daily.        . fish oil-omega-3 fatty acids 1000 MG capsule Take 1 g by mouth daily.        Marland Kitchen levothyroxine (SYNTHROID, LEVOTHROID) 175 MCG tablet Take 1 tablet (175 mcg total) by  mouth daily.  30 tablet  11  . lisinopril-hydrochlorothiazide (PRINZIDE,ZESTORETIC) 20-12.5 MG per tablet Take 1 tablet by mouth daily.  30 tablet  12  . metoprolol tartrate (LOPRESSOR) 25 MG tablet TAKE ONE-HALF TABLET BY MOUTH TWICE DAILY  30 tablet  5  . multivitamin (THERAGRAN) per tablet Take 1 tablet by mouth daily.        . sertraline (ZOLOFT) 50 MG tablet Take 1 tablet (50 mg total) by mouth daily.  30 tablet  5  . traMADol (ULTRAM) 50 MG tablet 1-2 po 6 hours prn  120 tablet  1    Allergies  Review of patient's allergies indicates no known allergies.  Electrocardiogram:  Assessment and Plan

## 2011-08-09 NOTE — Assessment & Plan Note (Signed)
F/U Dr Mardelle Matte Continue PT/OT

## 2011-08-09 NOTE — Assessment & Plan Note (Signed)
Chronic post AVR  F/U ECG next visit

## 2011-08-09 NOTE — Progress Notes (Signed)
Addended by: Lorayne Bender on: 08/09/2011 04:23 PM   Modules accepted: Orders

## 2011-08-09 NOTE — Assessment & Plan Note (Signed)
Zoloft has helped with impulsivness but needs to see psychologist and have meds adjusted.  Anxiety component poorly controlled

## 2011-08-10 ENCOUNTER — Ambulatory Visit: Payer: Self-pay

## 2011-08-10 ENCOUNTER — Telehealth: Payer: Self-pay | Admitting: Family Medicine

## 2011-08-10 LAB — BASIC METABOLIC PANEL
BUN: 19 mg/dL (ref 6–23)
CO2: 28 mEq/L (ref 19–32)
Calcium: 9.2 mg/dL (ref 8.4–10.5)
Chloride: 105 mEq/L (ref 96–112)
Creatinine, Ser: 0.9 mg/dL (ref 0.4–1.5)
GFR: 98.31 mL/min (ref >60.00)
Glucose, Bld: 89 mg/dL (ref 70–99)
Potassium: 4.1 mEq/L (ref 3.5–5.1)
Sodium: 141 mEq/L (ref 135–145)

## 2011-08-10 NOTE — Telephone Encounter (Signed)
Followup here to discuss. Patient was doing better at last visit

## 2011-08-10 NOTE — Telephone Encounter (Signed)
Walk in------patient saw Dr Johnsie Cancel yesterday and was told that Dr Johnsie Cancel was going to have Dr Elease Hashimoto refer him to see a specialist with anxiety. Please refer. Thanks.

## 2011-08-11 NOTE — Telephone Encounter (Signed)
lmoam to return my call to make an ov. Thanks.

## 2011-08-14 ENCOUNTER — Ambulatory Visit: Payer: Self-pay | Admitting: Physical Therapy

## 2011-08-16 ENCOUNTER — Ambulatory Visit: Payer: Self-pay | Admitting: Physical Therapy

## 2011-08-16 ENCOUNTER — Encounter (HOSPITAL_COMMUNITY): Payer: Self-pay | Admitting: *Deleted

## 2011-08-16 ENCOUNTER — Inpatient Hospital Stay (HOSPITAL_COMMUNITY)
Admission: EM | Admit: 2011-08-16 | Discharge: 2011-08-19 | DRG: 315 | Disposition: A | Discharge status: Other Acute Inpatient Hospital | Payer: 59 | Attending: Internal Medicine | Admitting: Internal Medicine

## 2011-08-16 ENCOUNTER — Ambulatory Visit (INDEPENDENT_AMBULATORY_CARE_PROVIDER_SITE_OTHER): Payer: Self-pay | Admitting: Family Medicine

## 2011-08-16 ENCOUNTER — Encounter: Payer: Self-pay | Admitting: Family Medicine

## 2011-08-16 VITALS — BP 150/98 | Temp 98.2°F | Wt 196.0 lb

## 2011-08-16 DIAGNOSIS — Q231 Congenital insufficiency of aortic valve: Secondary | ICD-10-CM

## 2011-08-16 DIAGNOSIS — I10 Essential (primary) hypertension: Secondary | ICD-10-CM | POA: Diagnosis present

## 2011-08-16 DIAGNOSIS — F329 Major depressive disorder, single episode, unspecified: Secondary | ICD-10-CM

## 2011-08-16 DIAGNOSIS — F3289 Other specified depressive episodes: Secondary | ICD-10-CM

## 2011-08-16 DIAGNOSIS — R079 Chest pain, unspecified: Secondary | ICD-10-CM | POA: Diagnosis present

## 2011-08-16 DIAGNOSIS — F411 Generalized anxiety disorder: Secondary | ICD-10-CM

## 2011-08-16 DIAGNOSIS — F341 Dysthymic disorder: Secondary | ICD-10-CM

## 2011-08-16 DIAGNOSIS — R45851 Suicidal ideations: Secondary | ICD-10-CM

## 2011-08-16 DIAGNOSIS — R748 Abnormal levels of other serum enzymes: Secondary | ICD-10-CM

## 2011-08-16 DIAGNOSIS — Z952 Presence of prosthetic heart valve: Secondary | ICD-10-CM

## 2011-08-16 DIAGNOSIS — I2510 Atherosclerotic heart disease of native coronary artery without angina pectoris: Secondary | ICD-10-CM

## 2011-08-16 DIAGNOSIS — F418 Other specified anxiety disorders: Secondary | ICD-10-CM

## 2011-08-16 DIAGNOSIS — E039 Hypothyroidism, unspecified: Secondary | ICD-10-CM | POA: Diagnosis present

## 2011-08-16 DIAGNOSIS — I519 Heart disease, unspecified: Secondary | ICD-10-CM | POA: Diagnosis present

## 2011-08-16 DIAGNOSIS — F419 Anxiety disorder, unspecified: Secondary | ICD-10-CM

## 2011-08-16 DIAGNOSIS — I959 Hypotension, unspecified: Principal | ICD-10-CM | POA: Diagnosis present

## 2011-08-16 DIAGNOSIS — Z954 Presence of other heart-valve replacement: Secondary | ICD-10-CM

## 2011-08-16 DIAGNOSIS — Z85850 Personal history of malignant neoplasm of thyroid: Secondary | ICD-10-CM

## 2011-08-16 LAB — COMPREHENSIVE METABOLIC PANEL
ALT: 16 U/L (ref 0–53)
AST: 23 U/L (ref 0–37)
Albumin: 4.8 g/dL (ref 3.5–5.2)
Alkaline Phosphatase: 54 U/L (ref 39–117)
BUN: 19 mg/dL (ref 6–23)
CO2: 26 mEq/L (ref 19–32)
Calcium: 10 mg/dL (ref 8.4–10.5)
Chloride: 98 mEq/L (ref 96–112)
Creatinine, Ser: 0.81 mg/dL (ref 0.50–1.35)
GFR calc Af Amer: >90 mL/min (ref >90)
GFR calc non Af Amer: >90 mL/min (ref >90)
Glucose, Bld: 112 mg/dL — ABNORMAL HIGH (ref 70–99)
Potassium: 3.9 mEq/L (ref 3.5–5.1)
Sodium: 136 mEq/L (ref 135–145)
Total Bilirubin: 0.7 mg/dL (ref 0.3–1.2)
Total Protein: 8 g/dL (ref 6.0–8.3)

## 2011-08-16 LAB — RAPID URINE DRUG SCREEN, HOSP PERFORMED
Amphetamines: NOT DETECTED
Barbiturates: NOT DETECTED
Benzodiazepines: NOT DETECTED
Cocaine: NOT DETECTED
Opiates: NOT DETECTED
Tetrahydrocannabinol: NOT DETECTED

## 2011-08-16 LAB — CBC
HCT: 41.4 % (ref 39.0–52.0)
Hemoglobin: 14.6 g/dL (ref 13.0–17.0)
MCH: 30.1 pg (ref 26.0–34.0)
MCHC: 35.3 g/dL (ref 30.0–36.0)
MCV: 85.4 fL (ref 78.0–100.0)
Platelets: 195 10*3/uL (ref 150–400)
RBC: 4.85 MIL/uL (ref 4.22–5.81)
RDW: 13.1 % (ref 11.5–15.5)
WBC: 10.2 10*3/uL (ref 4.0–10.5)

## 2011-08-16 LAB — ACETAMINOPHEN LEVEL: Acetaminophen (Tylenol), Serum: <15 ug/mL (ref 10–30)

## 2011-08-16 LAB — ETHANOL: Alcohol, Ethyl (B): <11 mg/dL (ref 0–11)

## 2011-08-16 MED ORDER — ASPIRIN 325 MG PO TABS
325.0000 mg | ORAL_TABLET | Freq: Every day | ORAL | Status: DC
  Administered 2011-08-16 – 2011-08-19 (×4): 325 mg via ORAL
  Filled 2011-08-16 (×6): qty 1

## 2011-08-16 MED ORDER — SERTRALINE HCL 50 MG PO TABS
50.0000 mg | ORAL_TABLET | Freq: Every day | ORAL | Status: DC
  Administered 2011-08-16: 50 mg via ORAL
  Filled 2011-08-16: qty 1

## 2011-08-16 MED ORDER — NICOTINE 21 MG/24HR TD PT24
21.0000 mg | MEDICATED_PATCH | Freq: Every day | TRANSDERMAL | Status: DC | PRN
  Filled 2011-08-16: qty 1

## 2011-08-16 MED ORDER — LISINOPRIL 20 MG PO TABS
20.0000 mg | ORAL_TABLET | Freq: Every day | ORAL | Status: DC
Start: 2011-08-17 — End: 2011-08-17
  Filled 2011-08-16 (×2): qty 1

## 2011-08-16 MED ORDER — IBUPROFEN 600 MG PO TABS
600.0000 mg | ORAL_TABLET | Freq: Three times a day (TID) | ORAL | Status: DC | PRN
  Filled 2011-08-16: qty 1

## 2011-08-16 MED ORDER — METOPROLOL TARTRATE 12.5 MG HALF TABLET
12.5000 mg | ORAL_TABLET | Freq: Two times a day (BID) | ORAL | Status: DC
  Administered 2011-08-16 – 2011-08-19 (×5): 12.5 mg via ORAL
  Filled 2011-08-16 (×9): qty 1

## 2011-08-16 MED ORDER — ACETAMINOPHEN 325 MG PO TABS: 650.0000 mg | ORAL_TABLET | ORAL | Status: DC | PRN

## 2011-08-16 MED ORDER — LORAZEPAM 0.5 MG PO TABS
0.5000 mg | ORAL_TABLET | Freq: Three times a day (TID) | ORAL | Status: DC | PRN
Start: 2011-08-16 — End: 2011-08-23

## 2011-08-16 MED ORDER — LISINOPRIL-HYDROCHLOROTHIAZIDE 20-12.5 MG PO TABS: 1.0000 | ORAL_TABLET | Freq: Every day | ORAL | Status: DC

## 2011-08-16 MED ORDER — TRAMADOL HCL 50 MG PO TABS
50.0000 mg | ORAL_TABLET | Freq: Four times a day (QID) | ORAL | Status: DC
  Administered 2011-08-16 – 2011-08-19 (×6): 50 mg via ORAL
  Filled 2011-08-16 (×16): qty 1

## 2011-08-16 MED ORDER — LORAZEPAM 0.5 MG PO TABS: 0.5000 mg | ORAL_TABLET | Freq: Three times a day (TID) | ORAL | Status: DC | PRN

## 2011-08-16 MED ORDER — ZOLPIDEM TARTRATE 5 MG PO TABS
5.0000 mg | ORAL_TABLET | Freq: Every evening | ORAL | Status: DC | PRN
  Administered 2011-08-18: 5 mg via ORAL
  Filled 2011-08-16: qty 1

## 2011-08-16 MED ORDER — LEVOTHYROXINE SODIUM 175 MCG PO TABS
175.0000 ug | ORAL_TABLET | Freq: Every day | ORAL | Status: DC
  Filled 2011-08-16: qty 1

## 2011-08-16 MED ORDER — HYDROCHLOROTHIAZIDE 12.5 MG PO CAPS
12.5000 mg | ORAL_CAPSULE | Freq: Every day | ORAL | Status: DC
  Filled 2011-08-16 (×2): qty 1

## 2011-08-16 MED ORDER — ONDANSETRON HCL 4 MG PO TABS: 4.0000 mg | ORAL_TABLET | Freq: Three times a day (TID) | ORAL | Status: DC | PRN
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
## 2011-08-16 NOTE — Telephone Encounter (Signed)
Pt made an appt  °

## 2011-08-16 NOTE — ED Notes (Signed)
md [facility] office [phone]  If needed can call about pts care

## 2011-08-16 NOTE — ED Notes (Signed)
Pt is currently pending a tele-psych consult for disposition.
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
## 2011-08-16 NOTE — ED Notes (Signed)
One patient belonging bag locked up in locker #40.

## 2011-08-16 NOTE — ED Notes (Signed)
Dr. Wentz at bedside. 

## 2011-08-17 ENCOUNTER — Encounter: Payer: Self-pay | Admitting: Family Medicine

## 2011-08-17 ENCOUNTER — Encounter (HOSPITAL_COMMUNITY): Payer: Self-pay

## 2011-08-17 ENCOUNTER — Inpatient Hospital Stay (HOSPITAL_COMMUNITY): Payer: Self-pay

## 2011-08-17 ENCOUNTER — Other Ambulatory Visit: Payer: Self-pay

## 2011-08-17 ENCOUNTER — Inpatient Hospital Stay (HOSPITAL_COMMUNITY): Admission: AD | Admit: 2011-08-17 | Payer: PRIVATE HEALTH INSURANCE | Source: Ambulatory Visit | Admitting: Psychiatry

## 2011-08-17 DIAGNOSIS — R45851 Suicidal ideations: Secondary | ICD-10-CM

## 2011-08-17 DIAGNOSIS — R748 Abnormal levels of other serum enzymes: Secondary | ICD-10-CM

## 2011-08-17 DIAGNOSIS — R079 Chest pain, unspecified: Secondary | ICD-10-CM

## 2011-08-17 DIAGNOSIS — I959 Hypotension, unspecified: Secondary | ICD-10-CM | POA: Diagnosis present

## 2011-08-17 LAB — CARDIAC PANEL(CRET KIN+CKTOT+MB+TROPI)
CK, MB: 4.1 ng/mL — ABNORMAL HIGH (ref 0.3–4.0)
Relative Index: INVALID (ref 0.0–2.5)
Total CK: 90 U/L (ref 7–232)
Troponin I: <0.3 ng/mL (ref <0.30)

## 2011-08-17 LAB — URINE CULTURE
Colony Count: 7000
Culture  Setup Time: 201301180247

## 2011-08-17 LAB — URINALYSIS, ROUTINE W REFLEX MICROSCOPIC
Bilirubin Urine: NEGATIVE
Glucose, UA: NEGATIVE mg/dL
Hgb urine dipstick: NEGATIVE
Ketones, ur: NEGATIVE mg/dL
Leukocytes, UA: NEGATIVE
Nitrite: NEGATIVE
Protein, ur: NEGATIVE mg/dL
Specific Gravity, Urine: 1.018 (ref 1.005–1.030)
Urobilinogen, UA: 1 mg/dL (ref 0.0–1.0)
pH: 7 (ref 5.0–8.0)

## 2011-08-17 LAB — POCT I-STAT, CHEM 8
BUN: 23 mg/dL (ref 6–23)
Calcium, Ion: 1.17 mmol/L (ref 1.12–1.32)
Chloride: 105 mEq/L (ref 96–112)
Creatinine, Ser: 0.9 mg/dL (ref 0.50–1.35)
Glucose, Bld: 112 mg/dL — ABNORMAL HIGH (ref 70–99)
HCT: 42 % (ref 39.0–52.0)
Hemoglobin: 14.3 g/dL (ref 13.0–17.0)
Potassium: 3.8 mEq/L (ref 3.5–5.1)
Sodium: 140 mEq/L (ref 135–145)
TCO2: 26 mmol/L (ref 0–100)

## 2011-08-17 LAB — CREATININE, SERUM
Creatinine, Ser: 0.77 mg/dL (ref 0.50–1.35)
GFR calc Af Amer: >90 mL/min (ref >90)
GFR calc non Af Amer: >90 mL/min (ref >90)

## 2011-08-17 LAB — POCT I-STAT TROPONIN I
Troponin i, poc: 0.42 ng/mL (ref 0.00–0.08)
Troponin i, poc: 0.01 ng/mL (ref 0.00–0.08)

## 2011-08-17 LAB — CBC
HCT: 39.6 % (ref 39.0–52.0)
Hemoglobin: 13.6 g/dL (ref 13.0–17.0)
MCH: 29.6 pg (ref 26.0–34.0)
MCHC: 34.3 g/dL (ref 30.0–36.0)
MCV: 86.1 fL (ref 78.0–100.0)
Platelets: 163 10*3/uL (ref 150–400)
RBC: 4.6 MIL/uL (ref 4.22–5.81)
RDW: 13.2 % (ref 11.5–15.5)
WBC: 6 10*3/uL (ref 4.0–10.5)

## 2011-08-17 LAB — GLUCOSE, CAPILLARY: Glucose-Capillary: 104 mg/dL — ABNORMAL HIGH (ref 70–99)

## 2011-08-17 MED ORDER — OMEGA-3-ACID ETHYL ESTERS 1 G PO CAPS
1.0000 g | ORAL_CAPSULE | Freq: Every day | ORAL | Status: DC
  Administered 2011-08-17 – 2011-08-19 (×3): 1 g via ORAL
  Filled 2011-08-17 (×4): qty 1

## 2011-08-17 MED ORDER — LORAZEPAM 1 MG PO TABS
2.0000 mg | ORAL_TABLET | Freq: Every day | ORAL | Status: DC
  Administered 2011-08-17 – 2011-08-18 (×3): 2 mg via ORAL
  Filled 2011-08-17 (×3): qty 2

## 2011-08-17 MED ORDER — BENZTROPINE MESYLATE 1 MG/ML IJ SOLN: 1.0000 mg | Freq: Every day | INTRAMUSCULAR | Status: DC

## 2011-08-17 MED ORDER — ACETAMINOPHEN 650 MG RE SUPP: 650.0000 mg | Freq: Four times a day (QID) | RECTAL | Status: DC | PRN

## 2011-08-17 MED ORDER — ONDANSETRON HCL 4 MG PO TABS: 4.0000 mg | ORAL_TABLET | Freq: Four times a day (QID) | ORAL | Status: DC | PRN

## 2011-08-17 MED ORDER — INFLUENZA VIRUS VACC SPLIT PF IM SUSP
0.5000 mL | INTRAMUSCULAR | Status: AC
  Administered 2011-08-18: 0.5 mL via INTRAMUSCULAR
  Filled 2011-08-17: qty 0.5

## 2011-08-17 MED ORDER — ARIPIPRAZOLE 10 MG PO TABS
10.0000 mg | ORAL_TABLET | Freq: Every day | ORAL | Status: DC
  Filled 2011-08-17: qty 1

## 2011-08-17 MED ORDER — SODIUM CHLORIDE 0.9 % IV SOLN: INTRAVENOUS | Status: DC

## 2011-08-17 MED ORDER — ALUM & MAG HYDROXIDE-SIMETH 200-200-20 MG/5ML PO SUSP: 30.0000 mL | ORAL | Status: DC | PRN

## 2011-08-17 MED ORDER — SODIUM CHLORIDE 0.9 % IV BOLUS (SEPSIS)
500.0000 mL | Freq: Once | INTRAVENOUS | Status: AC
  Administered 2011-08-17: 10:00:00 via INTRAVENOUS

## 2011-08-17 MED ORDER — SODIUM CHLORIDE 0.9 % IV SOLN
INTRAVENOUS | Status: DC
  Administered 2011-08-17 (×2): via INTRAVENOUS

## 2011-08-17 MED ORDER — ONDANSETRON HCL 4 MG/2ML IJ SOLN: 4.0000 mg | Freq: Four times a day (QID) | INTRAMUSCULAR | Status: DC | PRN

## 2011-08-17 MED ORDER — ARIPIPRAZOLE 10 MG PO TABS
10.0000 mg | ORAL_TABLET | Freq: Every day | ORAL | Status: DC
  Administered 2011-08-17 – 2011-08-18 (×3): 10 mg via ORAL
  Filled 2011-08-17 (×5): qty 1

## 2011-08-17 MED ORDER — ACETAMINOPHEN 325 MG PO TABS: 650.0000 mg | ORAL_TABLET | Freq: Four times a day (QID) | ORAL | Status: DC | PRN

## 2011-08-17 MED ORDER — ONDANSETRON 4 MG PO TBDP
ORAL_TABLET | ORAL | Status: AC
  Administered 2011-08-17: 4 mg
  Filled 2011-08-17: qty 1

## 2011-08-17 MED ORDER — ALUM & MAG HYDROXIDE-SIMETH 200-200-20 MG/5ML PO SUSP: 30.0000 mL | Freq: Four times a day (QID) | ORAL | Status: DC | PRN

## 2011-08-17 MED ORDER — ENOXAPARIN SODIUM 40 MG/0.4ML ~~LOC~~ SOLN
40.0000 mg | SUBCUTANEOUS | Status: DC
  Administered 2011-08-17 – 2011-08-19 (×3): 40 mg via SUBCUTANEOUS
  Filled 2011-08-17 (×4): qty 0.4

## 2011-08-17 MED ORDER — MAGNESIUM HYDROXIDE 400 MG/5ML PO SUSP: 30.0000 mL | Freq: Every day | ORAL | Status: DC | PRN

## 2011-08-17 MED ORDER — LEVOTHYROXINE SODIUM 175 MCG PO TABS
175.0000 ug | ORAL_TABLET | Freq: Every day | ORAL | Status: DC
  Administered 2011-08-17 – 2011-08-19 (×3): 175 ug via ORAL
  Filled 2011-08-17 (×4): qty 1

## 2011-08-17 MED ORDER — ONDANSETRON HCL 4 MG/2ML IJ SOLN: 4.0000 mg | INTRAMUSCULAR | Status: DC | PRN

## 2011-08-17 MED ORDER — SENNOSIDES-DOCUSATE SODIUM 8.6-50 MG PO TABS
1.0000 | ORAL_TABLET | Freq: Every evening | ORAL | Status: DC | PRN
  Filled 2011-08-17: qty 1

## 2011-08-17 MED ORDER — BENZTROPINE MESYLATE 1 MG PO TABS
1.0000 mg | ORAL_TABLET | Freq: Every day | ORAL | Status: DC
  Administered 2011-08-17 – 2011-08-19 (×3): 1 mg via ORAL
  Filled 2011-08-17 (×4): qty 1

## 2011-08-17 MED ORDER — LORAZEPAM 1 MG PO TABS: 2.0000 mg | ORAL_TABLET | Freq: Every day | ORAL | Status: DC

## 2011-08-17 MED ORDER — ADULT MULTIVITAMIN W/MINERALS CH
1.0000 | ORAL_TABLET | Freq: Every day | ORAL | Status: DC
  Administered 2011-08-18 – 2011-08-19 (×2): 1 via ORAL
  Filled 2011-08-17 (×4): qty 1

## 2011-08-17 NOTE — BHH Counselor (Signed)
Writer was in the process of completing patients support paperwork, however; pt became hypotensive. Per nurse, pt requires further medical clearance to address his medical needs at this time. Contacted Raynald Blend. In the assessment office to make her aware that patient is not ready to present to [facility] at this time and will remain in the ED for further medical observations.   Patient is not a candidate for the medical floor at this time. Writer asked Raynald Blend. In the assessment office to hold patients bed at [facility] for now.   Writer will provide [facility] assessment office with a follow up regarding patients medical status throughout the day.

## 2011-08-17 NOTE — Consult Note (Signed)
  Reason for Consult:chest pressure with elevated troponin  Referring Physician: [name] is an 56 y.o. male.   HPI: Bradley [name] is a 56 yo man with a h/o congenital aortic valve disease who underwent AVR with Aortic root reconstruction over a year ago. He was seen by Dr. Johnsie [name] one week ago and noted to have problems with increased anxiety. He presents today with the above symptoms. He notes some mild dyspnea and his LV Function post AVR was said to be decreased. He does not have CAD by cath prior to his AVR. The patient does not have any exertional chest pain though he has mild dyspnea with exertion. 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 No syncope. He is quite anxious.   PMH: Past Medical History  Diagnosis Date  . Anxiety   . Depression   . History of thyroid cancer   . Hypertension     PSHX: Past Surgical History  Procedure Date  . Cardiac catheterization 03/02/2010    NORMAL CORONARY ARTERY  . Sternotomy     REDO  . Transthoracic echocardiogram 03/2010    SHOWED MILD REDUCTION OF LV FUNCTION  . Thyroidectomy   . Rotator cuff repair 2012    Right    FAMHX: Family History  Problem Relation Age of Onset  . Heart disease Father     Social History:  reports that he has never smoked. He has never used smokeless tobacco. He reports that he does not drink alcohol or use illicit drugs.  Allergies: No Known Allergies  No results found.  ROS  As stated in the HPI and negative for all other systems.  Physical Exam  Vitals:Blood pressure 117/76, pulse 81, temperature 98.7 F (37.1 C), temperature source Oral, resp. rate 17, SpO2 100.00%.  Well appearing NAD HEENT: Unremarkable Neck:  No JVD, no thyromegally Lymphatics:  No adenopathy Back:  No CVA tenderness Lungs:  Clear with no wheezes, rales, or rhonchi HEART:  Regular rate rhythm, 2/6 systolic murmur at RUSB, no rubs, no  clicks Abd:  Flat, positive bowel sounds, no organomegally, no rebound, no guarding Ext:  2 plus pulses, no edema, no cyanosis, no clubbing Skin:  No rashes no nodules Neuro:  CN II through XII intact, motor grossly intact  Assessment/Plan: 1. Atypical chest discomfort - his symptoms are non-anginal. At this point there is no indication to work up his minimally elevated troponin. 2.  Prior AVR/Bentall - he is stable from this. LV dysfunction after this procedure is not uncommon as is CHF. He should maintain a low sodium diet and his current meds. He can followup with Dr. Johnsie [name]. 3. Anxiety - the etiology has been noted. If it is deemed most appropriate for inpatient evaluation, then I would pursue this. There is no medical problem at this point which would preclude doing so.  4. Disposition - please call for additional questions.  Bradley [name] TaylorMD 08/17/2011, 1:47 PM
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
## 2011-08-17 NOTE — ED Notes (Signed)
Prior to giving 1000 B/P medications, b/p taken and found at 80/40, pt. A/O, reporting no pain, sweating.  Pt. Reports not eating any bkft., 2nd b/p at 52, EPD notified and came to psych ED.  Per EPD, pt. Transported to acute ED Rm 11 for further evaluation and fluids.

## 2011-08-17 NOTE — Progress Notes (Signed)
1835 Received patient alert and oriented, no distress, no complaints of any pain or discomfort with 1:1 sitter. Family at bedside.

## 2011-08-17 NOTE — ED Notes (Signed)
Belongings in room 11 locked in cabinet with patient
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
## 2011-08-17 NOTE — H&P (Signed)
Bradley [name] MRN: [id number] DOB/AGE: 01/29/1956 56 y.o. Primary [place], MD, MD Admit date: 08/16/2011 Chief Complaint: Hypotension HPI:  Bradley [name] is a 56 year old Caucasian gentleman with history of congenital aortic valve disease status post aortic valve replacement with aortic root reconstruction over a year ago, history of increased anxiety per his PCP and cardiologist notes, history of thyroid cancer status post total thyroidectomy, history of depression, history of hypertension who had initially presented to the ED with anxiety and homicidal and suicidal ideations one day prior to admission. Patient was seen the day prior to admission per his PCP and noted to have worsening anxiety and depression with some suicidal and homicidal ideations and subsequently directly to the ED. Patient was assessed by the ACT team in the ED and inpatient psychiatric was recommended. On the morning of admission per ED physician patient was noted to be hypotensive with systolic blood pressures in the 80s with some diaphoresis and some complaints of chest pressure. An initial set of troponins was noted to be elevated. Patient was given some IV fluids and his blood pressure medications held with some improvement in his systolic blood pressure. Patient was seen in consultation by cardiology, Dr. Crissie Sickles who felt patient had atypical chest discomfort it was felt patient's chest discomfort was non-anginal at that time and felt it was no indication for further workup of his minimally elevated troponins. Patient does endorse some mild dizziness and some throbbing in the head and his chest this morning as well as before in the past when he been seen by his PCP. Patient does also endorse some anxiety as well. Patient denies any fever, no chills, no cough, no diarrhea, no constipation, no dysuria. Patient does endorse some occasional left heel numbness and some occasional hand numbness in his shoulder  that he had undergone recent surgery. Patient also does endorse some nausea. Due to patient's systolic blood pressures in the 80s which responded to IV fluids we'll called by the ED physician for further evaluation and management.  Past Medical History  Diagnosis Date  . Anxiety   . Depression   . History of thyroid cancer   . Hypertension     Past Surgical History  Procedure Date  . Cardiac catheterization 03/02/2010    NORMAL CORONARY ARTERY  . Sternotomy     REDO  . Transthoracic echocardiogram 03/2010    SHOWED MILD REDUCTION OF LV FUNCTION  . Thyroidectomy   . Rotator cuff repair 2012    Right    Prior to Admission medications   Medication Sig Start Date End Date Taking? Authorizing Provider  aspirin 325 MG tablet Take 325 mg by mouth daily.     Yes Historical Provider, MD  fish oil-omega-3 fatty acids 1000 MG capsule Take 1 g by mouth daily.     Yes Historical Provider, MD  levothyroxine (SYNTHROID, LEVOTHROID) 175 MCG tablet Take 1 tablet (175 mcg total) by mouth daily. 02/17/11  Yes [name], MD  lisinopril-hydrochlorothiazide (PRINZIDE,ZESTORETIC) 20-12.5 MG per tablet Take 1 tablet by mouth daily. 05/09/11 05/08/12 Yes Josue Hector, MD  LORazepam (ATIVAN) 0.5 MG tablet Take 1 tablet (0.5 mg total) by mouth every 8 (eight) hours as needed for anxiety. 08/16/11 08/26/11 Yes [name], MD  metoprolol tartrate (LOPRESSOR) 25 MG tablet TAKE ONE-HALF TABLET BY MOUTH TWICE DAILY 11/10/10  Yes Romeo Apple, MD  multivitamin [facility]) per tablet Take 1 tablet by mouth daily.     Yes Historical Provider, MD  sertraline (ZOLOFT) 50 MG tablet Take 1 tablet (50 mg total) by mouth daily. 04/13/11 04/12/12 Yes [name], MD  traMADol Veatrice Bourbon) 50 MG tablet 1-2 po 6 hours prn 04/13/11  Yes [name], MD    Allergies: No Known Allergies  Family History  Problem Relation Age of Onset  . Heart disease Father     Social History:  reports that he has never  smoked. He has never used smokeless tobacco. He reports that he does not drink alcohol or use illicit drugs.  ROS: All systems reviewed with the patient and was positive as per HPI otherwise all other systems are negative.  PHYSICAL EXAM: Blood pressure 107/79, pulse 85, temperature 98.7 F (37.1 C), temperature source Oral, resp. rate 17, SpO2 100.00%. General: Well-developed, well-nourished in no acute cardiopulmonary distress.  HEENT: Normocephalic, atraumatic. Pupils equal round and reactive to light and accommodation. Extraocular movements intact. Oropharynx is clear, no lesions, no exudates. Neck is supple with no lymphadenopathy. No bruits, no goiter. Heart: Regular rate and rhythm, 2/6 systolic ejection murmur at the right upper sternal border. Lungs: Clear to auscultation bilaterally. Abdomen: Soft, nontender, nondistended, positive bowel sounds. Extremities: No clubbing cyanosis or edema with positive pedal pulses. Neuro: Alert and oriented x3. Cranial nerves II through XII are grossly intact.     EKG: Normal sinus rhythm with a first-degree AV block.  No results found for this or any previous visit (from the past 240 hour(s)).   Lab results:  [facility] 08/17/11 0950 08/16/11 1735  NA 140 136  K 3.8 3.9  CL 105 98  CO2 -- 26  GLUCOSE 112* 112*  BUN 23 19  CREATININE 0.90 0.81  CALCIUM -- 10.0  MG -- --  PHOS -- --    Basename 08/16/11 1735  AST 23  ALT 16  ALKPHOS 54  BILITOT 0.7  PROT 8.0  ALBUMIN 4.8   No results found for this basename: LIPASE:2,AMYLASE:2 in the last 72 hours  Basename 08/17/11 0950 08/16/11 1735  WBC -- 10.2  NEUTROABS -- --  HGB 14.3 14.6  HCT 42.0 41.4  MCV -- 85.4  PLT -- 195   No results found for this basename: CKTOTAL:3,CKMB:3,CKMBINDEX:3,TROPONINI:3 in the last 72 hours No components found with this basename: POCBNP:3 No results found for this basename: DDIMER in the last 72 hours No results found for this basename:  HGBA1C:2 in the last 72 hours No results found for this basename: CHOL:2,HDL:2,LDLCALC:2,TRIG:2,CHOLHDL:2,LDLDIRECT:2 in the last 72 hours No results found for this basename: TSH,T4TOTAL,FREET3,T3FREE,THYROIDAB in the last 72 hours No results found for this basename: VITAMINB12:2,FOLATE:2,FERRITIN:2,TIBC:2,IRON:2,RETICCTPCT:2 in the last 72 hours Imaging results:  No results found. Impression/Plan:  Principal Problem:  *Hypotension Active Problems:  HYPOTHYROIDISM  DEPRESSION  CHEST PAIN, EXERTIONAL  Hypertension  Suicidal ideation  Anxiety   #1 hypotension Questionable etiology. May be likely secondary to dehydration versus secondary to multiple antihypertensive medications versus infectious etiology versus cardiac etiology. Patient does not have any neurological deficits. Will admit the patient for observation on telemetry. We'll cycle cardiac enzymes every 8 hours x3. Will check a TSH. Will check a UA with cultures and sensitivities. Will check a chest x-ray. Patient's blood pressures responded to IV fluids. We'll continue IV fluids. Will hold all patient's antihypertensive medications for now. Follow. #2 atypical chest pain We'll cycle cardiac enzymes every 8 hours x3. Per cardiology patient does not have coronary artery disease per prior cath. It was felt per cardiology that patient may continue his current  diet and no further workup was needed. Will hold patient's beta blocker and an ACE inhibitor secondary to problem #1. #3 hypothyroidism Check a TSH continue home dose Synthroid. #4 depression/anxiety/suicidal ideations Patient has been seen by psychiatry and recommended inpatient psychiatry. Will continue patient's anxiolytics and psychiatric medications for now. 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 #5 hypertension Hold patient's BP medications for now. #6 prior aVR Stable per cardiology. Continue cardiac diet  will hold patient's antihypertensives including his beta blocker and lisinopril secondary to problem #1. Patient will followup with cardiology as outpatient. #7 prophylaxis Lovenox for DVT prophylaxis.   Rhegan Trunnell 08/17/2011, 3:21 PM

## 2011-08-17 NOTE — ED Notes (Signed)
YD:[id number] Expected date:08/17/11<BR> Expected time: 9:30 AM<BR> Means of arrival:<BR> Comments:<BR> Pt from Rm #[id number]

## 2011-08-17 NOTE — ED Notes (Signed)
(  1) patient belonging bag left at [facility], correctly labeled with patients name. Pt resting in bed (rrom 26, TCU) with call bell within reach and side rails up x 2.

## 2011-08-17 NOTE — ED Provider Notes (Signed)
0930:  T/C by Duane Lope ED RN:  Martin Majestic to give pt his morning BP meds and found pt in room diaphoretic, pale, c/o lightheadedness, with SBP 80-90's.  Pt himself denies syncope, denies CP/SOB, no abd pain, no N/V/D.  Pt had received BP meds last night approx 2200, given multi sedative/psych meds this morning 0300.  Pt endorses hx of normal cardiac cath in 02/2010, s/p AVR in 02/2010.  A&O, neuro non-focal, speech clear, no facial droop, moves all ext well, appears fatigued, diaphoretic, resps easy, CTA, RRR, abd soft/NT.  Will check labs, EKG; give IVF bolus and reassess.  ED RN to hold pt's morning BP meds, but will dose pt's ASA.    Date: 08/17/2011  Rate: 63  Rhythm: normal sinus rhythm  QRS Axis: normal  Intervals: PR prolonged  ST/T Wave abnormalities: normal  Conduction Disutrbances:first-degree A-V block  and nonspecific intraventricular conduction delay  Narrative Interpretation:   Old EKG Reviewed: unchanged; no significant changes from previous EKG dated 03/12/2010.   POCT I-STAT, CHEM 8      Component Value Range   Sodium 140  135 - 145 (mEq/L)   Potassium 3.8  3.5 - 5.1 (mEq/L)   Chloride 105  96 - 112 (mEq/L)   BUN 23  6 - 23 (mg/dL)   Creatinine, Ser 0.90  0.50 - 1.35 (mg/dL)   Glucose, Bld 112 (*) 70 - 99 (mg/dL)   Calcium, Ion 1.17  1.12 - 1.32 (mmol/L)   TCO2 26  0 - 100 (mmol/L)   Hemoglobin 14.3  13.0 - 17.0 (g/dL)   HCT 42.0  39.0 - 52.0 (%)  POCT I-STAT TROPONIN I      Component Value Range   Troponin i, poc 0.42 (*) 0.00 - 0.08 (ng/mL)   Comment NOTIFIED PHYSICIAN       1005:  SBP 112, Sats 99% R/A, monitor NSR with occas PAC's.  Resps without distress.  Continues diaphoretic. Denies CP/SOB.  Only c/o nausea.  Will dose IV zofran.  ED RN to give ASA 325mg  per previous orders.   10:12 AM:  T/C to [facility], case discussed, including:  HPI, pertinent PM/SHx, VS/PE, dx testing, ED course and treatment.  Agreeable to eval in ED.  2:28 PM:  Pt was eval by Cards MD in  ED.  States pt's last cardiac cath was negative for CAD, will not re-cath pt unless troponin VERY significantly elevated.  Pt apparently has hx of not being very forthcoming with is medical information to his treating providers.  Pt's 2nd troponin normal.  SBP increased to 110's after IVF bolus and holding multiple BP meds.  Will need obs admit to assure BP remains stable (possible BP meds adjustment), cycle cardiac enzymes.  Pt will need [facility] admit after hospital admit.  T/C to [facility]. Grandville Silos, case discussed, including:  HPI, pertinent PM/SHx, VS/PE, dx testing, ED course and treatment.  Agreeable to admit, will come to ED for eval.      [place], DO 08/17/11 1432
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
## 2011-08-17 NOTE — ED Notes (Signed)
Breakfast tray given. °

## 2011-08-17 NOTE — ED Notes (Signed)
Received pt pale diaphoresis with weakness place on monitor vital see doc flow ivf started

## 2011-08-17 NOTE — ED Notes (Signed)
Pt. HR at 0920 at 64.

## 2011-08-18 LAB — COMPREHENSIVE METABOLIC PANEL
ALT: 11 U/L (ref 0–53)
AST: 15 U/L (ref 0–37)
Albumin: 3.3 g/dL — ABNORMAL LOW (ref 3.5–5.2)
Alkaline Phosphatase: 40 U/L (ref 39–117)
BUN: 13 mg/dL (ref 6–23)
CO2: 26 mEq/L (ref 19–32)
Calcium: 8.6 mg/dL (ref 8.4–10.5)
Chloride: 104 mEq/L (ref 96–112)
Creatinine, Ser: 0.78 mg/dL (ref 0.50–1.35)
GFR calc Af Amer: >90 mL/min (ref >90)
GFR calc non Af Amer: >90 mL/min (ref >90)
Glucose, Bld: 98 mg/dL (ref 70–99)
Potassium: 3.7 mEq/L (ref 3.5–5.1)
Sodium: 136 mEq/L (ref 135–145)
Total Bilirubin: 0.4 mg/dL (ref 0.3–1.2)
Total Protein: 5.8 g/dL — ABNORMAL LOW (ref 6.0–8.3)

## 2011-08-18 LAB — CARDIAC PANEL(CRET KIN+CKTOT+MB+TROPI)
CK, MB: 3.6 ng/mL (ref 0.3–4.0)
CK, MB: 3.2 ng/mL (ref 0.3–4.0)
Relative Index: INVALID (ref 0.0–2.5)
Relative Index: INVALID (ref 0.0–2.5)
Total CK: 85 U/L (ref 7–232)
Total CK: 91 U/L (ref 7–232)
Troponin I: <0.3 ng/mL (ref <0.30)
Troponin I: <0.3 ng/mL (ref <0.30)

## 2011-08-18 LAB — TSH: TSH: 3.363 u[IU]/mL (ref 0.350–4.500)

## 2011-08-18 LAB — CBC
HCT: 34.7 % — ABNORMAL LOW (ref 39.0–52.0)
Hemoglobin: 11.9 g/dL — ABNORMAL LOW (ref 13.0–17.0)
MCH: 29.6 pg (ref 26.0–34.0)
MCHC: 34.3 g/dL (ref 30.0–36.0)
MCV: 86.3 fL (ref 78.0–100.0)
Platelets: 159 10*3/uL (ref 150–400)
RBC: 4.02 MIL/uL — ABNORMAL LOW (ref 4.22–5.81)
RDW: 13.3 % (ref 11.5–15.5)
WBC: 5.4 10*3/uL (ref 4.0–10.5)

## 2011-08-18 LAB — CORTISOL: Cortisol, Plasma: 5.7 ug/dL

## 2011-08-18 LAB — MAGNESIUM: Magnesium: 2 mg/dL (ref 1.5–2.5)

## 2011-08-18 NOTE — Progress Notes (Signed)
Reviewed chart.  Noted telepsych document from 08/17/11 recommends inpt tx for Pt under IVC status.   IVC has been completed.  Notified [place] of telepsych document.  Informed by Denman George that this document is sufficient for intpt tx at [facility] and informed that another psych consult will not be necessary, at this time.  Per Denman George, no beds today.  CSW to follow tomorrow with [facility] bed status.  Bernita Raisin, [place] Work [phone]

## 2011-08-18 NOTE — Progress Notes (Signed)
Subjective: Doing well, no further chest pain, slight headache Objective: Vital signs in last 24 hours: Temp:  [97.6 F (36.4 C)-98.7 F (37.1 C)] 97.6 F (36.4 C) (01/18 0544) Pulse Rate:  [71-88] 71  (01/18 0544) Resp:  [15-18] 18  (01/18 0544) BP: (80-133)/(40-80) 132/80 mmHg (01/18 0544) SpO2:  [97 %-100 %] 97 % (01/18 0544) Weight:  [87.091 kg (192 lb)] 87.091 kg (192 lb) (01/17 1900) Weight change:  Last BM Date: 08/17/11  Intake/Output from previous day: 01/17 0701 - 01/18 0700 In: 1200 [I.V.:1200] Out: -      Physical Exam: General: Alert, awake, oriented x3, in no acute distress. HEENT: No bruits, no goiter. Heart: Regular rate and rhythm, without murmurs, rubs, gallops. Lungs: Clear to auscultation bilaterally. Abdomen: Soft, nontender, nondistended, positive bowel sounds. Extremities: No clubbing cyanosis or edema with positive pedal pulses. Neuro: Grossly intact, nonfocal.  Lab Results: Basic Metabolic Panel:  Basename 08/18/11 0350 08/17/11 1945 08/17/11 0950 08/16/11 1735  NA 136 -- 140 --  K 3.7 -- 3.8 --  CL 104 -- 105 --  CO2 26 -- -- 26  GLUCOSE 98 -- 112* --  BUN 13 -- 23 --  CREATININE 0.78 0.77 -- --  CALCIUM 8.6 -- -- 10.0  MG 2.0 -- -- --  PHOS -- -- -- --   Liver Function Tests:  Basename 08/18/11 0350 08/16/11 1735  AST 15 23  ALT 11 16  ALKPHOS 40 54  BILITOT 0.4 0.7  PROT 5.8* 8.0  ALBUMIN 3.3* 4.8   No results found for this basename: LIPASE:2,AMYLASE:2 in the last 72 hours No results found for this basename: AMMONIA:2 in the last 72 hours CBC:  Basename 08/18/11 0350 08/17/11 1945  WBC 5.4 6.0  NEUTROABS -- --  HGB 11.9* 13.6  HCT 34.7* 39.6  MCV 86.3 86.1  PLT 159 163   Cardiac Enzymes:  Basename 08/18/11 0045 08/17/11 1550  CKTOTAL 85 90  CKMB 3.6 4.1*  CKMBINDEX -- --  TROPONINI <0.30 <0.30   BNP: No results found for this basename: PROBNP:3 in the last 72 hours D-Dimer: No results found for this basename:  DDIMER:2 in the last 72 hours CBG:  Basename 08/17/11 0943  GLUCAP 104*   Hemoglobin A1C: No results found for this basename: HGBA1C in the last 72 hours Fasting Lipid Panel: No results found for this basename: CHOL,HDL,LDLCALC,TRIG,CHOLHDL,LDLDIRECT in the last 72 hours Thyroid Function Tests:  Basename 08/17/11 1550  TSH 3.363  T4TOTAL --  FREET4 --  T3FREE --  THYROIDAB --   Anemia Panel: No results found for this basename: VITAMINB12,FOLATE,FERRITIN,TIBC,IRON,RETICCTPCT in the last 72 hours Coagulation: No results found for this basename: LABPROT:2,INR:2 in the last 72 hours Urine Drug Screen: Drugs of Abuse     Component Value Date/Time   LABOPIA NONE DETECTED 08/16/2011 1703   COCAINSCRNUR NONE DETECTED 08/16/2011 1703   LABBENZ NONE DETECTED 08/16/2011 1703   AMPHETMU NONE DETECTED 08/16/2011 1703   THCU NONE DETECTED 08/16/2011 1703   LABBARB NONE DETECTED 08/16/2011 1703    Alcohol Level:  Basename 08/16/11 1735  ETH <11    No results found for this or any previous visit (from the past 240 hour(s)).  Studies/Results: Portable Chest 1 View  08/17/2011  *RADIOLOGY REPORT*  Clinical Data: Shortness of breath  PORTABLE CHEST - 1 VIEW  Comparison: 04/05/2010  Findings: Lungs are clear. No pleural effusion or pneumothorax.  The heart is top normal in size.  Sternotomy wires.  Prosthetic valve.  IMPRESSION:  No evidence of acute cardiopulmonary disease.  Original Report Authenticated By: Julian Hy, M.D.    Medications: Scheduled Meds:   . ARIPiprazole  10 mg Oral QHS  . aspirin  325 mg Oral Daily  . benztropine  1 mg Oral Daily  . enoxaparin  40 mg Subcutaneous Q24H  . influenza  inactive virus vaccine  0.5 mL Intramuscular Tomorrow-1000  . levothyroxine  175 mcg Oral QAC breakfast  . LORazepam  2 mg Oral QHS  . metoprolol tartrate  12.5 mg Oral BID  . mulitivitamin with minerals  1 tablet Oral Daily  . omega-3 acid ethyl esters  1 g Oral Daily  .  ondansetron      . sodium chloride  500 mL Intravenous Once  . traMADol  50 mg Oral Q6H  . DISCONTD: hydrochlorothiazide  12.5 mg Oral Daily  . DISCONTD: lisinopril  20 mg Oral Daily   Continuous Infusions:   . sodium chloride 100 mL/hr at 08/17/11 1859  . DISCONTD: sodium chloride     PRN Meds:.acetaminophen, acetaminophen, acetaminophen, alum & mag hydroxide-simeth, alum & mag hydroxide-simeth, ibuprofen, LORazepam, magnesium hydroxide, nicotine, ondansetron (ZOFRAN) IV, ondansetron, senna-docusate, zolpidem, DISCONTD: acetaminophen, DISCONTD: ondansetron, DISCONTD: ondansetron  Assessment/Plan:  1. Hypotension: transient unclear etiology -resolved, likely due to meds vs dehydration, Stop IVF, Restart low dose metoprolol 2. Depression/Suicidal thoughts : consult Psych, continue 1:1 sitter,  3.  HYPOTHYROIDISM: continue synthroid 4. H/O AVR/LV dysfunction: follow up with Dr.Nishan 5. Atypical chest pain: resolved, cardiac enzymes negative, no events on tele, no need for further work up per Cardiology 6. Disposition: depends on Psych eval, medically stable for DC or Transfer    LOS: 2 days   [facility] Pager: [phone] 08/18/2011, 8:25 AM

## 2011-08-18 NOTE — Progress Notes (Signed)
   CARE MANAGEMENT NOTE 08/18/2011  Patient:  Bradley [name], Bradley [name]   Account Number:  [id number]  Date Initiated:  08/18/2011  Documentation initiated by:  Dessa Phi  Subjective/Objective Assessment:   ADMITTED W/HYPOTENSION,SUICIDAL IDEATION.HX CONGENTIAL AORTIC VALVE DZ.     Action/Plan:   FORM HOME W/SPOUSE.PSYCH EVAL PEND.   Anticipated DC Date:  08/22/2011   Anticipated DC Plan:  [place] referral  Clinical Social Worker         Choice offered to / List presented to:             Status of service:  In process, will continue to follow Medicare Important Message given?   (If response is "NO", the following Medicare IM given date fields will be blank) Date Medicare IM given:   Date Additional Medicare IM given:    Discharge Disposition:    Per UR Regulation:  Reviewed for med. necessity/level of care/duration of stay  Comments:  08/18/11 [facility] Andrews Tener RN,BSN [phone]

## 2011-08-19 ENCOUNTER — Inpatient Hospital Stay (HOSPITAL_COMMUNITY)
Admission: AD | Admit: 2011-08-19 | Discharge: 2011-08-23 | DRG: 885 | Disposition: A | Discharge status: Home / Self Care | Payer: No Typology Code available for payment source | Source: Ambulatory Visit | Attending: Psychiatry | Admitting: Psychiatry

## 2011-08-19 ENCOUNTER — Encounter (HOSPITAL_COMMUNITY): Payer: Self-pay

## 2011-08-19 DIAGNOSIS — R45851 Suicidal ideations: Secondary | ICD-10-CM

## 2011-08-19 DIAGNOSIS — R45850 Homicidal ideations: Secondary | ICD-10-CM

## 2011-08-19 DIAGNOSIS — I10 Essential (primary) hypertension: Secondary | ICD-10-CM

## 2011-08-19 DIAGNOSIS — F319 Bipolar disorder, unspecified: Secondary | ICD-10-CM

## 2011-08-19 DIAGNOSIS — Z7982 Long term (current) use of aspirin: Secondary | ICD-10-CM

## 2011-08-19 DIAGNOSIS — Z85850 Personal history of malignant neoplasm of thyroid: Secondary | ICD-10-CM

## 2011-08-19 DIAGNOSIS — F411 Generalized anxiety disorder: Secondary | ICD-10-CM | POA: Diagnosis present

## 2011-08-19 DIAGNOSIS — Z79899 Other long term (current) drug therapy: Secondary | ICD-10-CM

## 2011-08-19 DIAGNOSIS — F333 Major depressive disorder, recurrent, severe with psychotic symptoms: Principal | ICD-10-CM | POA: Diagnosis present

## 2011-08-19 DIAGNOSIS — R079 Chest pain, unspecified: Secondary | ICD-10-CM

## 2011-08-19 MED ORDER — TRAMADOL HCL 50 MG PO TABS
50.0000 mg | ORAL_TABLET | Freq: Four times a day (QID) | ORAL | Status: DC
  Administered 2011-08-19 – 2011-08-23 (×14): 50 mg via ORAL
  Filled 2011-08-19 (×19): qty 1

## 2011-08-19 MED ORDER — METOPROLOL TARTRATE 12.5 MG HALF TABLET
12.5000 mg | ORAL_TABLET | ORAL | Status: DC
  Administered 2011-08-19 – 2011-08-23 (×8): 12.5 mg via ORAL
  Filled 2011-08-19 (×11): qty 1

## 2011-08-19 MED ORDER — ALUM & MAG HYDROXIDE-SIMETH 200-200-20 MG/5ML PO SUSP: 30.0000 mL | ORAL | Status: DC | PRN

## 2011-08-19 MED ORDER — ASPIRIN EC 325 MG PO TBEC
325.0000 mg | DELAYED_RELEASE_TABLET | Freq: Every day | ORAL | Status: DC
  Administered 2011-08-20 – 2011-08-23 (×4): 325 mg via ORAL
  Filled 2011-08-19 (×6): qty 1

## 2011-08-19 MED ORDER — ACETAMINOPHEN 325 MG PO TABS
650.0000 mg | ORAL_TABLET | Freq: Four times a day (QID) | ORAL | Status: DC | PRN
  Administered 2011-08-23: 650 mg via ORAL

## 2011-08-19 MED ORDER — LISINOPRIL 10 MG PO TABS: 10.0000 mg | ORAL_TABLET | Freq: Every day | ORAL | Status: DC

## 2011-08-19 MED ORDER — BENZTROPINE MESYLATE 1 MG PO TABS
1.0000 mg | ORAL_TABLET | Freq: Every day | ORAL | Status: DC
  Administered 2011-08-19 – 2011-08-22 (×4): 1 mg via ORAL
  Filled 2011-08-19 (×5): qty 1

## 2011-08-19 MED ORDER — MAGNESIUM HYDROXIDE 400 MG/5ML PO SUSP: 30.0000 mL | Freq: Every day | ORAL | Status: DC | PRN

## 2011-08-19 MED ORDER — ARIPIPRAZOLE 10 MG PO TABS
10.0000 mg | ORAL_TABLET | Freq: Every day | ORAL | Status: DC
  Administered 2011-08-19 – 2011-08-20 (×2): 10 mg via ORAL
  Filled 2011-08-19 (×3): qty 1

## 2011-08-19 MED ORDER — ADULT MULTIVITAMIN W/MINERALS CH
1.0000 | ORAL_TABLET | Freq: Every day | ORAL | Status: DC
  Administered 2011-08-20 – 2011-08-23 (×4): 1 via ORAL
  Filled 2011-08-19 (×4): qty 1

## 2011-08-19 MED ORDER — OMEGA-3-ACID ETHYL ESTERS 1 G PO CAPS
1.0000 g | ORAL_CAPSULE | Freq: Every day | ORAL | Status: DC
  Administered 2011-08-20 – 2011-08-23 (×4): 1 g via ORAL
  Filled 2011-08-19 (×5): qty 1

## 2011-08-19 MED ORDER — LEVOTHYROXINE SODIUM 175 MCG PO TABS
175.0000 ug | ORAL_TABLET | Freq: Every day | ORAL | Status: DC
  Administered 2011-08-20 – 2011-08-23 (×4): 175 ug via ORAL
  Filled 2011-08-19 (×5): qty 1

## 2011-08-19 NOTE — Progress Notes (Addendum)
Patient ID: Bradley [name], male   DOB: 1955/09/22, 56 y.o.   MRN: [id number]  Chart reviewed, case discussed with Admissions Dept., [place].  Pt accepted yesterday for transfer to [facility] once pt is medically cleared and a bed becomes available.  See note below, dated 08/18/11:  "Reviewed chart. Noted telepsych document from 08/17/11 recommends inpt tx for Pt under IVC status.  IVC has been completed.  Notified [place] of telepsych document. Informed by Denman George that this document is sufficient for intpt tx at [facility] and informed that another psych consult will not be necessary, at this time. Per Denman George, no beds today.  CSW to follow tomorrow with [facility] bed status.  Bernita Raisin, [place], Clinical Social Work, [phone]."  Talked to Admissions at 1:30pm.  Bed placement being evaluated at this time and they will contact team once an assignment has been made.  Thank you.   4:18pm - Addendum  Update: chart further reviewed and discussed with Dr. Ulyses Jarred who agrees with plan to transfer pt to [facility].  Will request [address] bed, placement pending.  Pager Dr. Broadus John to discuss and am awaiting return call.
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
## 2011-08-19 NOTE — Progress Notes (Signed)
Faxed IVC documents, as well as Pt's telepsych consult, to [facility].  Bernita Raisin, [place] Work [phone]

## 2011-08-19 NOTE — Progress Notes (Signed)
Per Gershon Mussel, no decision has been made.  Provided Tom with RN's name and contact info, if d/c is possible later today.  Notified RN and MD.  Bernita Raisin, [place] Work [phone]

## 2011-08-19 NOTE — Progress Notes (Signed)
Per Gershon Mussel at [facility], awaiting decision from psych MDs.  CSW to f/u.  Bernita Raisin, [place] Work [phone]

## 2011-08-19 NOTE — Discharge Summary (Signed)
Physician Discharge Summary  Patient ID: Bradley [name] MRN: [id number] DOB/AGE: [birth year] 56 y.o.  Admit date: 08/16/2011 Discharge date: 08/19/2011  Primary Care Physician:  [name], MD, MD  Cardiologist Dr. Jenkins Rouge   Discharge Diagnoses:   1. Transient Hypotension: Resolved 2. Depression with suicidal ideation 3. hypothyroidism 4. Atypical Chest pain: resolved 5. Hypertension 6.  Anxiety 7. history of congenital aortic valvular disease status post AVR 8. normal coronary arteries by cath in 02/2010 9. mild LV dysfunction on echo in 03/2010 10. Thyroidectomy  Current Discharge Medication List    START taking these medications   Details  lisinopril (ZESTRIL) 10 MG tablet Take 1 tablet (10 mg total) by mouth daily.      CONTINUE these medications which have NOT CHANGED   Details  aspirin 325 MG tablet Take 325 mg by mouth daily.      fish oil-omega-3 fatty acids 1000 MG capsule Take 1 g by mouth daily.      levothyroxine (SYNTHROID, LEVOTHROID) 175 MCG tablet Take 1 tablet (175 mcg total) by mouth daily. Qty: 30 tablet, Refills: 11   Associated Diagnoses: Unspecified hypothyroidism    LORazepam (ATIVAN) 0.5 MG tablet Take 1 tablet (0.5 mg total) by mouth every 8 (eight) hours as needed for anxiety. Qty: 30 tablet, Refills: 0    metoprolol tartrate (LOPRESSOR) 25 MG tablet TAKE ONE-HALF TABLET BY MOUTH TWICE DAILY Qty: 30 tablet, Refills: 5   Associated Diagnoses: CAD (coronary artery disease)    multivitamin (THERAGRAN) per tablet Take 1 tablet by mouth daily.      traMADol (ULTRAM) 50 MG tablet 1-2 po 6 hours prn Qty: 120 tablet, Refills: 1      STOP taking these medications     lisinopril-hydrochlorothiazide (PRINZIDE,ZESTORETIC) 20-12.5 MG per tablet      sertraline (ZOLOFT) 50 MG tablet        Disposition and Follow-up:  PCP in 1 week  Consults:   Dr. Carleene Overlie taylor in ER Psychiatry  Brief H and P: Her Bradley [name] is a 56 year old gentleman  with congenital aortic valve disease status post aVR over one year ago was sent to the ER by his PCP due to anxiety and depression with suicidal ideation. An evaluation in emergency room his systolic blood pressure was found to be in the mid 80s which responded to IV fluids and subsequently was admitted to the hospital for observation and further evaluation. In addition you also had some atypical chest pain while in the emergency department.  Hospital Course:  1. Transient hypotension: This resolved in the emergency room itself before he was admitted to the floor with 500 cc of normal saline, he was worked up for infectious etiology, cardiac etiology, all of this workup was negative. Antihypertensives were held and slowly resumed over the next 2 days. He had transient atypical chest pain in the ER was seen by Dr. Boris Lown cardiology who felt that his symptoms were noncardiac and didn't feel the need for further cardiac workup given his negative cath 2011, nevertheless he was also ruled out based on cardiac markers. 2. Depression with suicidal thoughts: Seen by the act team in the emergency room who recommended inpatient psychiatry, and involuntary commitment, he has had a one-to-one sitter during his hospital stay and will be discharged to behavioral health center pending bed availability.  Time spent on Discharge: 54min  Signed: Jenalee Trevizo [facility] Pager: [phone] 08/19/2011, 2:09 PM

## 2011-08-20 DIAGNOSIS — F411 Generalized anxiety disorder: Secondary | ICD-10-CM

## 2011-08-20 DIAGNOSIS — F333 Major depressive disorder, recurrent, severe with psychotic symptoms: Principal | ICD-10-CM

## 2011-08-20 MED ORDER — MAGNESIUM HYDROXIDE 400 MG/5ML PO SUSP: 30.0000 mL | Freq: Every day | ORAL | Status: DC | PRN

## 2011-08-20 MED ORDER — SERTRALINE HCL 50 MG PO TABS
50.0000 mg | ORAL_TABLET | Freq: Every day | ORAL | Status: DC
  Administered 2011-08-20 – 2011-08-21 (×2): 50 mg via ORAL
  Filled 2011-08-20 (×3): qty 1

## 2011-08-20 NOTE — Progress Notes (Signed)
Patient ID: JHAMIR MCGILTON, male   DOB: [birth year], 56 y.o.   MRN: [id number]   [facility] Group Notes:  (Counselor/Nursing/MHT/Case Management/Adjunct)  08/20/2011 1:15 PM  Type of Therapy:  Group Therapy, Dance/Movement Therapy   Participation Level:  Active  Participation Quality:  Redirectable and Sharing  Affect:  Appropriate  Cognitive:  Oriented  Insight:  Good  Engagement in Group:  Good  Engagement in Therapy:  Good  Modes of Intervention:  Clarification, Problem-solving, Role-play, Socialization and Support  Summary of Progress/Problems:  Pt stated that he was feeling bright, but cautious. Pt shared personal experience with soothing himself by rubbing his hands, knees or neck as a way to support himself when he cannot receive it from others. Group discussed the qualities of a positive, healthy support, as well as how to give and receive appropriate support. When asked to name a positive support in his life, he mentioned his mother.  Theda Sers, [employer]
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
## 2011-08-20 NOTE — Progress Notes (Signed)
[facility] Adult Inpatient Family/Significant Other Suicide Prevention Education  Suicide Prevention Education:  Contact Attempts: Georgina Peer (sister) [phone], (name of family member/significant other) has been identified by the patient as the family member/significant other with whom the patient will be residing, and identified as the person(s) who will aid the patient in the event of a mental health crisis.  With written consent from the patient, two attempts were made to provide suicide prevention education, prior to and/or following the patient's discharge.  We were unsuccessful in providing suicide prevention education.  A suicide education pamphlet was given to the patient to share with family/significant other.  Date and time of first attempt: 08/20/11 at: 3:58 PM [facility] 08/20/2011, 3:57 PM

## 2011-08-20 NOTE — Progress Notes (Signed)
Patient was observed by writer sitting in the dayroom watching tv no interaction with peers. Writer spoke with patient 1:1 and inquired about how his first day had been and he reported that he enjoyed the groups he attended and his wife and children came in to visit today. Patient inquired as to how long he will be here and Probation officer encouraged patient to speak with his dr. In the am because he would have more information concerning his length of stay. Patient was encouraged to continue to attend groups and use this information to help once discharged. Patient reported that he was glad that he came in to seek help and was appreciative of the staff. Patient currently denies having pain, -si/hi/a/v hall. Safety maintained on the unit will continue to monitor.

## 2011-08-20 NOTE — Progress Notes (Addendum)
Patient ID: Bradley [name], male   DOB: 09-28-1955, 56 y.o.   MRN: [id number] 08/20/2011 Nursing 1745 Bradley [name] is adjusting nicely to being in the hospital. He is cooperative, depressed and has a flat affect. He is compliant with his POC, attending his groups. Engaging in his POC. He asks appropriate questions. He completed his self inventory and on it he wrote that  His depression and hopelessness are " 8 / 8 " , respectively and that he cont with " off and on" SI. He contracts for safety. A He is started on Zoloft 50 mg Po QD. R Safety is maintaiend and [place] oncludes fostering therapeutic relationship already established PD RN [facility]
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
## 2011-08-20 NOTE — Tx Team (Signed)
Initial Interdisciplinary Treatment Plan  PATIENT STRENGTHS: (choose at least two) Ability for insight Supportive family/friends  PATIENT STRESSORS: Financial difficulties Health problems   PROBLEM LIST: Problem List/Patient Goals Date to be addressed Date deferred Reason deferred Estimated date of resolution  SI       Depression      Concurrent medical pxs      HI                                     DISCHARGE CRITERIA:  Ability to meet basic life and health needs Adequate post-discharge living arrangements Improved stabilization in mood, thinking, and/or behavior Medical problems require only outpatient monitoring Motivation to continue treatment in a less acute level of care Need for constant or close observation no longer present Reduction of life-threatening or endangering symptoms to within safe limits Safe-care adequate arrangements made Verbal commitment to aftercare and medication compliance  PRELIMINARY DISCHARGE PLAN: Attend aftercare/continuing care group Outpatient therapy Participate in family therapy Return to previous living arrangement  PATIENT/FAMIILY INVOLVEMENT: This treatment plan has been presented to and reviewed with the patient, Bradley [name], and/or family member,  .  The patient and family have been given the opportunity to ask questions and make suggestions.  [name] 08/20/2011, 4:11 AM

## 2011-08-20 NOTE — Progress Notes (Signed)
Patient ID: Bradley [name], male   DOB: [birth year], 56 y.o.   MRN: [id number] Pt. attended and participated in aftercare planning group. Pt. accepted information on suicide prevention, warning signs to look for with suicide and crisis line numbers to use. The pt. agreed to call crisis line numbers if having warning signs or having thoughts of suicide. Pt. stated that he was feeling nervous.  He described his current mood level as earth tones colors such as blue, white and yellow.
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
## 2011-08-21 ENCOUNTER — Encounter: Payer: Self-pay | Admitting: Physical Therapy

## 2011-08-21 LAB — T4, FREE: Free T4: 1.22 ng/dL (ref 0.80–1.80)

## 2011-08-21 LAB — T3, FREE: T3, Free: 2.5 pg/mL (ref 2.3–4.2)

## 2011-08-21 MED ORDER — SERTRALINE HCL 100 MG PO TABS: 100.0000 mg | ORAL_TABLET | Freq: Every day | ORAL | Status: DC

## 2011-08-21 MED ORDER — SERTRALINE HCL 50 MG PO TABS
50.0000 mg | ORAL_TABLET | Freq: Every day | ORAL | Status: DC
  Administered 2011-08-22 – 2011-08-23 (×2): 50 mg via ORAL
  Filled 2011-08-21 (×3): qty 1

## 2011-08-21 MED ORDER — ARIPIPRAZOLE 15 MG PO TABS
15.0000 mg | ORAL_TABLET | Freq: Every day | ORAL | Status: DC
  Administered 2011-08-21 – 2011-08-22 (×2): 15 mg via ORAL
  Filled 2011-08-21 (×3): qty 1

## 2011-08-21 NOTE — Progress Notes (Signed)
[facility] Group Notes: (Counselor/Nursing/MHT/Case Management/Adjunct) 08/21/2011   @1 :15pm  Type of Therapy:  Group Therapy  Participation Level:  Active  Participation Quality:  Attentive, Appropriate, Sharing  Affect:  Appropriate  Cognitive:  Appropriate  Insight:  Good  Engagement in Group:  Good  Engagement in Therapy: Good    Modes of Intervention:  Support and Exploration  Summary of Progress/Problems: Bradley [name] explored dimensions of wellness and identified areas he struggles with most. With redirection to focus on the positive, he was able to identify that he is strong in intellectual wellness, and to process how this can help him to improve other areas of wellness. Bradley [name] was very engaged in discussion about mindfulness as a wellness techniques and intention as a way to regain control over one's life .  Bradley [name] 08/21/2011 2:06 PM

## 2011-08-21 NOTE — Progress Notes (Signed)
[facility] Group Notes: (Counselor/Nursing/MHT/Case Management/Adjunct) 08/21/2011   @  11:00am   Type of Therapy:  Group Therapy  Participation Level:  Active  Participation Quality:  Attentive, Appropriate, Sharing  Affect:  Appropriate  Cognitive:  Appropriate  Insight:  Good  Engagement in Group: Good  Engagement in Therapy:  Good  Modes of Intervention:  Support and Exploration  Summary of Progress/Problems: Bradley [name] reflected on happiness, and shared that his family brings him happiness. He went further to say that he has a happy memory of his father giving him his first Conservation officer, nature. Although he stated he does not know why this was so happy for him, Bradley [name] indicated that he felt proud and excited at the time. Bradley [name] processed how many times the things we find happiness in are the simple things that we have to stop and think about. He wondered aloud why we remember negative things so much more, and explored the concept of a new type of wellness in which one seeks out happiness rather than focusing on sadness.   Bradley [name] 08/21/2011 12:16 PM

## 2011-08-21 NOTE — Progress Notes (Signed)
D:  Pt attended wrap-up group tonight.  He expressed having difficulties with family visit and will go to family counseling upon discharge.  He was supportive of group members and shared experiences with rentals and renters obligations to help peers.  He enjoyed the therapeutic group of coping skills which he will use at discharge.  Vitals taken T98.5 P84, R16 BP158/95 sitting, P86 R18 BP151/100 standing.  A:  Encouraged and supported pt's plan of family counselors and participation in group with peers.  Reported vitals and group interaction with RN.  R:  Pt is safe. Carlye Grippe, Deazia Lampi L, MHT/NS
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
## 2011-08-21 NOTE — Progress Notes (Signed)
Patient ID: Bradley [name], male   DOB: 09/25/1955, 56 y.o.   MRN: [id number] Patient polite and appropriate this evening. States having a great day, verbalizes no complaints or complications. Patient blunted and brightens when spoken to. States that family has been visiting him while on the unit. Patient very receptive and appreciative of staff. Thankful for his stay here at [facility]. Interacting appropriately with other peers on the unit. Denies SI/HI/AV.

## 2011-08-21 NOTE — Tx Team (Signed)
Interdisciplinary Treatment Plan Update (Adult)  Date:  08/21/2011  Time Reviewed:  11:38 AM   Progress in Treatment: Attending groups: Yes Participating in groups:  Yes Taking medication as prescribed: Yes Tolerating medication:  Yes Family/Significant othe contact made:  No, counselor assessing for appropriate contact Patient understands diagnosis:  Yes Discussing patient identified problems/goals with staff:  Yes Medical problems stabilized or resolved:  Yes Denies suicidal/homicidal ideation: Yes Issues/concerns per patient self-inventory:  None identified Other: N/A  New problem(s) identified: None Identified  Reason for Continuation of Hospitalization: Anxiety Depression Homicidal ideation Medication stabilization Suicidal ideation  Interventions implemented related to continuation of hospitalization: mood stabilization, medication monitoring and adjustment, group therapy and psycho education, safety checks q 15 mins  Additional comments: N/A  Estimated length of stay: 3-5 days  Discharge Plan: Pt will be referred to a psychiatrist and therapist in [place] for medication management and therapy  New goal(s): N/A  Review of initial/current patient goals per problem list:    1.  Goal(s): Reduce depressive symptoms  Met:  No  Target date: by discharge  As evidenced by: Reducing depression from a 10 to a 3 as reported by pt.   2.  Goal (s): Reduce/Eliminate suicidal ideation and homicidal ideation   Met:  No  Target date: by discharge  As evidenced by: pt reporting no SI/HI.    3.  Goal(s): Reduce anxiety  Met:  No  Target date: by discharge  As evidenced by: Reduce anxiety from a 10 to a 3 as reported by pt.    Attendees: Patient:  Bradley [name] 08/21/2011 11:38 AM   Family:     Physician:  Rudean Curt, MD  08/21/2011  11:38 AM   Nursing:   Grayland Ormond, RN 08/21/2011 11:38 AM   Case Manager:  Regan Lemming, [place] 08/21/2011  11:38 AM     Counselor:  Lillie Fragmin, LCSW 08/21/2011  11:38 AM   Other:  Joette Catching, LCSW 08/21/2011  11:38 AM   Other:  Agustina Caroli, NP 08/21/2011 11:38 AM   Other:     Other:      Scribe for Treatment Team:   Ane Payment, 08/21/2011 , 11:38 AM

## 2011-08-21 NOTE — Progress Notes (Signed)
Pt seen in treatment team and in consultation.  He appeared quite manic with push of speech and over fixated on issues.  He is very over inclusive when he tries to "sumarize" why he is in the hospital in 15 minutes.  I increased his Abilify to help the OCD and the bipolar features operating with him today.  His push of speech is quite problematic at times.  His mood is better in that he is settling in to the unit and uses less dramatic terms to describe his mood today.
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
## 2011-08-22 NOTE — Progress Notes (Addendum)
[facility] [place] Group Notes: (Counselor/Nursing/MHT/Case Management/Adjunct) 08/22/2011   @  11:00am   Type of Therapy:  Group Therapy  Participation Level:  Active  Participation Quality:  Attentive, Sharing, Appropriate  Affect:  Appropriate  Cognitive:  Appropriate  Insight:  Good  Engagement in Group: Good  Engagement in Therapy:  Good  Modes of Intervention:  Support and Exploration  Summary of Progress/Problems: Herb participated in breathing techniques and safe-place guided imagery. He processed his experience, saying that it was very helpful and calming to him. He also stated that while the facilitator on the CD talked about a safe place, he pictured what, to him, is the definition of tranquilty.  Herb talked about using the breathing techniques as a way to cope with anxiety and anger after discharge.   [name] 08/22/2011 2:12 PM

## 2011-08-22 NOTE — Progress Notes (Signed)
On self inventory sheet, sleeps well, has improving appetite, improving attention span.   Rated depression #4, hopelessness #5.  SI off/on, contracts for safety.   Denied SI while talking to nurse.  Physical problems in past 24 hours are lightheadedness, pain, headache, blurred vision, pain right arm, neck, back, hand.  Zero pain goal.   Worst pain in past 24 hours #7.   Plans to eat healthy, walk, tell family he loves them.  "I'm very appreciative to everyone here.  Thank you."  No problems staying on meds after discharge.

## 2011-08-22 NOTE — Progress Notes (Signed)
Grief and loss Group  Group was interactive with each other focusing primarily on loss related to self esteem and ability to feel loved and valued. Group members were supportive of one another and able to share insights from their own experiences. They were able to talk about the importance of feeling sadness and how vulnerable such feelings have made them feel and how they have avoided these feelings in the past to their detriment. Strong sense of encouraging one another.   Pt. Was active and engaged in group, related well and supportively to other group members. He talked about his job history working professionally as one who [employer] Due to illnesses, surgery etc. He has lost his ability to do this and that is threatening his sense of identity and ability to take care of his family.  He is discovering the importance of being in touch with his love of his children and others and both hearing it from others and expressing it.   Wells Guiles M.Div. [place]

## 2011-08-22 NOTE — Progress Notes (Signed)
[facility] MD Progress Note  08/22/2011 9:45 AM  Diagnosis:  Axis I: Bipolar, Manic  ADL's:  Intact  Sleep:  Yes,  AEB:  Appetite:  Yes,  AEB:  Suicidal Ideation: He only thinks about this when he is asked about it.  Plan:  No  Intent:  No  Means:  No  Homicidal Ideation: He only thinks about this when he is asked about it.  Plan:  No  Intent:  No  Means:  No  Mental Status: General Appearance /Behavior:  Casual Eye Contact:  Good Motor Behavior:  Normal Speech:  Much less pressure and almost natural conversational speed and intensity.  He reports feeling calmer with the increase in Abilify. Level of Consciousness:  Alert Mood:  3/10 on a scale of 1 the best and 10 the worst Affect:  Appropriate Anxiety Level:  5 to 6 /10 on the same scale, anticipation Thought Process:  Coherent Thought Content:  WNL Perception:  Normal Judgment:  Fair Insight:  Present Cognition:  Orientation time, place and person Concentration Yes Sleep:  Number of Hours: 5   Vital Signs:Blood pressure 155/92, pulse 79, temperature 96.7 F (35.9 C), temperature source Oral, resp. rate 18, height 6\' 1"  (1.854 m), weight 88.905 kg (196 lb).  Lab Results:  Results for orders placed during the hospital encounter of 08/19/11 (from the past 48 hour(s))  T3, FREE     Status: Normal   Collection Time   08/20/11  7:50 PM      Component Value Range Comment   T3, Free 2.5  2.3 - 4.2 (pg/mL)   T4, FREE     Status: Normal   Collection Time   08/20/11  7:50 PM      Component Value Range Comment   Free T4 1.22  0.80 - 1.80 (ng/dL)     Physical Findings: AIMS:  , ,  ,  ,    CIWA:    COWS:     Treatment Plan Summary: Daily contact with patient to assess and evaluate symptoms and progress in treatment Medication management  Plan: Continue monitoring medications and see when he settles into a typical speed of thinking and talking.  Shalisha Clausing 08/22/2011, 9:45 AM

## 2011-08-22 NOTE — Progress Notes (Signed)
Pt attended discharge planning group and actively participated.  Pt presents with calm mood and affect.  Pt states he feels much better today with improved mood.  Pt ranks depression at a 3-4 and anxiety at a 5 today.  Pt denies SI.  Pt states he has got a lot from groups and his peers here and continues to give positive feedback about his treatment here.  Pt states that he wishes he would've gotten help with his depression earlier.  Pt states his family has come to visit him here and has a lot of support.  Pt will follow up at [facility].  Safety planning and suicide prevention discussed.     Regan Lemming, [place] 08/22/2011  9:43 AM

## 2011-08-22 NOTE — Progress Notes (Signed)
Pt is a pleasant 56 yr old male that is bright in affect and anxious in mood. Pt is anticipating his possible discharge for tomorrow. Although pt describes it in mixed emotions with his appreciation of [facility] as a whole, and the new people he have meet while being here. Pt is actively attending groups and is observed interacting with others well. Pt is denying SI/HI at this time. Pt reports he doesn't even think of suicide until the staff mentions it. Continued support and availability has been extended to this pt. Pt safety remains with q74min checks.

## 2011-08-22 NOTE — Progress Notes (Signed)
[facility] Group Notes: (Counselor/Nursing/MHT/Case Management/Adjunct) 08/22/2011   @1 :15pm  Type of Therapy:  Group Therapy  Participation Level:  Active  Participation Quality:  Attentive, Sharing, Supportive, Appropriate  Affect:  Appropriate (speech was somewhat pressured)  Cognitive:  Appropriate  Insight:  Good  Engagement in Group: Good  Engagement in Therapy:  Good  Modes of Intervention:  Support and Exploration  Summary of Progress/Problems:  Bradley [name] talked about the changes that have taken place in his life, specifically being unable to perform the job he identified himself with. He shared experience with being unable to do even simple things he expects himself to be able to do, such as vacuuming a room in the house, and the consequences for trying to be  "himself". Bradley [name] stated that he has worked hard over the past 7 years to get his family adjusted to living here rather than in [place], but that while he has tried to be strong for them he has been losing a grip on himself. At this point he believes he has come to accept that some things will be different for him in the future and is working to embrace these changes. Bradley [name] was very helpful to others in processing their experiences from a new perspective, and very supportive of them.  [name] 08/22/2011 2:47 PM

## 2011-08-23 ENCOUNTER — Encounter: Payer: Self-pay | Admitting: Physical Therapy

## 2011-08-23 MED ORDER — BENZTROPINE MESYLATE 1 MG PO TABS: 1.0000 mg | ORAL_TABLET | Freq: Every day | ORAL | Status: DC

## 2011-08-23 MED ORDER — SERTRALINE HCL 50 MG PO TABS: 50.0000 mg | ORAL_TABLET | Freq: Every day | ORAL | Status: DC

## 2011-08-23 MED ORDER — ARIPIPRAZOLE 15 MG PO TABS: 15.0000 mg | ORAL_TABLET | Freq: Every day | ORAL | Status: DC

## 2011-08-23 NOTE — Progress Notes (Signed)
[facility] Notes: (Counselor/Nursing/MHT/Case Management/Adjunct) 08/23/2011   @1 :15pm  Type of Therapy:  Group Therapy  Participation Level:  Active  Participation Quality:  Attentive, Sharing, Appropriate  Affect:  Appropriate  Cognitive:  Appropriate  Insight:  Good  Engagement in Group:  Good  Engagement in Therapy:  Good  Modes of Intervention:  Support and Exploration  Summary of Progress/Problems: Ezzard was very engaged, discussing his self-concept and how he second-guesses himself on things he is not completely sure of. He talked about being a leader in things he is confident he knows how to do, but then shrinking back in situations like group when he is afraid he would say or do the wrong thing, or that his input wouldn't be as valuable as others. Nayshaun was very supportive to others who discussed their self-esteem and gave good suggestions of ways to build self-concept.  [name] 08/23/2011 2:37 PM

## 2011-08-23 NOTE — Progress Notes (Signed)
Patient ID: Bradley [name], male   DOB: Dec 01, 1955, 56 y.o.   MRN: [id number] Pt is awake and active on the unit this AM. Pt denies SI/HI and AVH. Pt mood is happy and affect is bright. Pt is participating in the milieu and is in good spirits. Pt is attending groups but he forwards little on approach. Writer will continue to monitor.

## 2011-08-23 NOTE — Discharge Summary (Signed)
Discharge Note  Patient:  Bradley [name] is an 56 y.o., male DOB:  05-09-1956  Date of Admission:  08/19/2011  Date of Discharge:  08/23/2011  Level of Care:  OP  Discharge destination:  HOME  Is patient on multiple antipsychotic therapies at discharge:  NO  Patient phone:  [phone] (home) Patient address:   [address] [place] [address]  The patient received suicide prevention pamphlet:  YES Belongings returned:  Valuables  Gilford Rile, Landra Howze 08/23/2011,1:31 PM

## 2011-08-23 NOTE — Progress Notes (Signed)
[facility] Notes:  (Counselor/Nursing/MHT/Case Management/Adjunct)  08/23/2011 12:56 PM  Type of Therapy:  Group Therapy  Participation Level:  Active  Participation Quality:  Appropriate and Attentive  Affect:  Appropriate  Cognitive:  Alert and Appropriate  Insight:  Good  Engagement in Group:  Good  Engagement in Therapy:  Good  Modes of Intervention:  Clarification, Education, Problem-solving, Support and Exploration  Summary of Progress/Problems: Patient reported that he understood that he needs to set boundaries in order to be receptive to positive and support advice, while blocking out negativity.   Frederich Cha 08/23/2011, 12:56 PM  Cosigned by: Lillie Fragmin, LCSW
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
## 2011-08-23 NOTE — Progress Notes (Signed)
[facility] Case Management Discharge Plan:  Will you be returning to the same living situation after discharge: Yes,  pt to return home At discharge, do you have transportation home?:Yes,  pt has own transportation Do you have the ability to pay for your medications:Yes,  pt has access to meds  Interagency Information:   Release of information consent forms completed and in the chart; pt's signature needed at discharge.   Release of information consent forms completed and in the chart;  Patient's signature needed at discharge.  Patient to Follow up at:  Follow-up Information    Follow up with [facility] on 08/24/2011. (Walk in clinic Monday - Friday 8-12 pm)    Contact information:   [address], [place] [address] [phone]         Patient denies SI/HI:   Yes,  pt denies today    Safety Planning and Suicide Prevention discussed:  Yes,  discussed with pt  Barrier to discharge identified:No.  Summary and Recommendations: Pt attended discharge planning group and actively participated.  Pt presents with calm mood and affect.  Pt ranks depression at a 5 today and anxiety at 4 today.  Pt denies SI.  Pt will follow up with [facility] for medication management and therapy.  Pt states he got a lot out of being here and group therapy.  No recommendations from SW.  No further needs voiced by pt.  Pt stable to discharge.     Ane Payment 08/23/2011, 10:43 AM

## 2011-08-23 NOTE — Progress Notes (Signed)
Patient ID: Bradley [name], male   DOB: 01/13/1956, 56 y.o.   MRN: [id number] Writer reviewed discharge instructions with pt including medications, follow up care and crisis intervention. Pt verbally acknowledged understanding. Pt denies SI/HI and AVH and states that he has no reservations about leaving at this time. Pt belongings returned from locker and pt is released into his own care.

## 2011-08-23 NOTE — Tx Team (Signed)
Interdisciplinary Treatment Plan Update (Adult)  Date:  08/23/2011  Time Reviewed:  10:46 AM   Progress in Treatment: Attending groups: Yes Participating in groups:  Yes Taking medication as prescribed: Yes Tolerating medication:  Yes Family/Significant othe contact made:  Counselor continuing to attempt contact today Patient understands diagnosis:  Yes Discussing patient identified problems/goals with staff:  Yes Medical problems stabilized or resolved:  Yes Denies suicidal/homicidal ideation: Yes Issues/concerns per patient self-inventory:  None identified Other: N/A  New problem(s) identified: None Identified  Reason for Continuation of Hospitalization: Stable to d/c  Interventions implemented related to continuation of hospitalization: Stable to d/c  Additional comments: N/A  Estimated length of stay: D/C today  Discharge Plan: Pt will follow up at [facility] for medication management and therapy.    New goal(s): N/A  Review of initial/current patient goals per problem list:    1.  Goal(s): Reduce depressive symptoms  Met:  No  Target date: by discharge  As evidenced by: Reducing depression from a 10 to a 3 as reported by pt. Pt ranks at a 5 today  2.  Goal (s): Reduce/Eliminate suicidal ideation  Met:  Yes  Target date: by discharge  As evidenced by: pt reporting no SI.    3.  Goal(s): Reduce anxiety  Met:  No  Target date: by discharge  As evidenced by: Reduce anxiety from a 10 to a 3 as reported by pt. Pt ranks at a 4 today   Attendees: Patient:  Bradley [name] 08/23/2011 10:49 AM   Family:     Physician:  Rudean Curt, MD  08/23/2011  10:46 AM   Nursing:   Bernita Raisin, RN 08/23/2011 10:49 AM   Case Manager:  Regan Lemming, [place] 08/23/2011  10:46 AM   Counselor:  Lillie Fragmin, LCSW 08/23/2011  10:46 AM   Other:  Joette Catching, LCSW 08/23/2011  10:46 AM   Other:  Frederich Cha, counseling intern 08/23/2011 10:49 AM     Other:  Era Bumpers, counseling intern 08/23/2011 10:49 AM   Other:      Scribe for Treatment Team:   Ane Payment, 08/23/2011 , 10:46 AM

## 2011-08-23 NOTE — [facility] Suicide Risk Assessment (Signed)
Suicide Risk Assessment  Discharge Assessment     Demographic factors:  Assessment Details Time of Assessment: Admission Current Mental Status:  Current Mental Status: Suicidal ideation indicated by patient;Self-harm thoughts;Thoughts of violence towards others Risk Reduction Factors:  Risk Reduction Factors: Sense of responsibility to family;Living with another person, especially a relative;Positive social support  CLINICAL FACTORS:   Bipolar Disorder:   Bipolar II Obsessive-Compulsive Disorder Previous Psychiatric Diagnoses and Treatments  COGNITIVE FEATURES THAT CONTRIBUTE TO RISK:  Thought constriction (tunnel vision)    SUICIDE RISK:   Minimal: No identifiable suicidal ideation.  Patients presenting with no risk factors but with morbid ruminations; may be classified as minimal risk based on the severity of the depressive symptoms  Suicidal Ideation:   Plan:  No  Intent:  No  Means:  No  Homicidal Ideation:   Plan:  No  Intent:  No  Means:  Yes  Mental Status: General Appearance /Behavior:  Neat Eye Contact:  Good Motor Behavior:  Normal Speech:  Normal conversational volume, rate, and tone. Level of Consciousness:  Alert Mood:  1 on a scale of 1 is the least and 10 is the most Affect:  Appropriate Anxiety Level:  1 on a scale of 1 is the least and 10 is the most Thought Process:  Coherent Thought Content:  WNL Perception:  Normal Judgment:  Good Insight:  Present Cognition:  Orientation time, place and person Concentration Yes Sleep:  Number of Hours: 5   Vital Signs:Blood pressure 129/92, pulse 97, temperature 97 F (36.1 C), temperature source Oral, resp. rate 18, height 6\' 1"  (1.854 m), weight 88.905 kg (196 lb). Lab Results:No results found for this or any previous visit (from the past 27 hour(s)).   Reports what he has learned from this hospital stay is he needs to be honest with himself, he needs to listen to his wife who has been telling him that  his mood is off.  Risk of harm to self is elevated by his history of bipolar disorder, but is reduced by him being on treatment and involved in treatment with his spouse who can give him accurate feedback about his mood.  He has never made any suicide attempts.  Risk of harm to others is minimal in that he has never been involved in fights nor has he every had any misconduct charges.  Continue Medication List  As of 08/23/2011  1:22 PM   STOP taking these medications         LORazepam 0.5 MG tablet         TAKE these medications         ARIPiprazole 15 MG tablet   Commonly known as: ABILIFY   Take 1 tablet (15 mg total) by mouth at bedtime. For mood and OCD      aspirin 325 MG tablet   Take 325 mg by mouth daily. For stroke and heart attack prevention      benztropine 1 MG tablet   Commonly known as: COGENTIN   Take 1 tablet (1 mg total) by mouth at bedtime. For stiffness, but may cause blurred vision, dry mouth or constipation.      fish oil-omega-3 fatty acids 1000 MG capsule   Take 1 g by mouth daily. For mood and brain health      levothyroxine 175 MCG tablet   Commonly known as: SYNTHROID, LEVOTHROID   Take 1 tablet (175 mcg total) by mouth daily. For replacement      lisinopril 10  MG tablet   Commonly known as: PRINIVIL,ZESTRIL   Take 1 tablet (10 mg total) by mouth daily. For control of high blood pressure      metoprolol tartrate 25 MG tablet   Commonly known as: LOPRESSOR   TAKE ONE-HALF TABLET BY MOUTH TWICE DAILY for control of high blood pressure      multivitamin per tablet   Take 1 tablet by mouth daily. For dietary supplementation and nutritional recovery      sertraline 50 MG tablet   Commonly known as: ZOLOFT   Take 1 tablet (50 mg total) by mouth daily. For depression and OCD      traMADol 50 MG tablet   Commonly known as: ULTRAM   1-2 po 6 hours prn for pain management, would be best to minimize use as this can amplify the level of pain you  actually feel.           Gail Creekmore 08/23/2011, 1:22 PM

## 2011-08-23 NOTE — Progress Notes (Signed)
[facility] Adult Inpatient Family/Significant Other Suicide Prevention Education  Suicide Prevention Education:  Education Completed; Raidon Goebel, sister,  has been identified by the patient as the family member/significant other with whom the patient will be residing, and identified as the person(s) who will aid the patient in the event of a mental health crisis (suicidal ideations/suicide attempt).  With written consent from the patient, the family member/significant other has been provided the following suicide prevention education, prior to the and/or following the discharge of the patient.  The suicide prevention education provided includes the following:  Suicide risk factors  Suicide prevention and interventions  National Suicide Hotline telephone number  [facility] assessment telephone number  [facility] Emergency Assistance [place] and/or Residential Mobile Crisis Unit telephone number  Request made of family/significant other to:  Remove weapons (e.g., guns, rifles, knives), all items previously/currently identified as safety concern.    Remove drugs/medications (over-the-counter, prescriptions, illicit drugs), all items previously/currently identified as a safety concern.  Claiborne Billings reported that she has known for a couple of years that H[place] needed help with his depression and anger, but could not get him to seek any. She stated that she is glad he came to the hospital and that he is doing much better now. Claiborne Billings has no safety concerns for [place] at this time, and denied that he has any access to weapons. She verbalized understanding of suicide prevention information and had no further questions.   [name] 08/23/2011, 2:30 PM

## 2011-08-23 NOTE — Discharge Instructions (Signed)
Continue Medication List  As of 08/23/2011  1:22 PM   STOP taking these medications         LORazepam 0.5 MG tablet         TAKE these medications         ARIPiprazole 15 MG tablet   Commonly known as: ABILIFY   Take 1 tablet (15 mg total) by mouth at bedtime. For mood and OCD      aspirin 325 MG tablet   Take 325 mg by mouth daily. For stroke and heart attack prevention      benztropine 1 MG tablet   Commonly known as: COGENTIN   Take 1 tablet (1 mg total) by mouth at bedtime. For stiffness, but may cause blurred vision, dry mouth or constipation.      fish oil-omega-3 fatty acids 1000 MG capsule   Take 1 g by mouth daily. For mood and brain health      levothyroxine 175 MCG tablet   Commonly known as: SYNTHROID, LEVOTHROID   Take 1 tablet (175 mcg total) by mouth daily. For replacement      lisinopril 10 MG tablet   Commonly known as: PRINIVIL,ZESTRIL   Take 1 tablet (10 mg total) by mouth daily. For control of high blood pressure      metoprolol tartrate 25 MG tablet   Commonly known as: LOPRESSOR   TAKE ONE-HALF TABLET BY MOUTH TWICE DAILY for control of high blood pressure      multivitamin per tablet   Take 1 tablet by mouth daily. For dietary supplementation and nutritional recovery      sertraline 50 MG tablet   Commonly known as: ZOLOFT   Take 1 tablet (50 mg total) by mouth daily. For depression and OCD      traMADol 50 MG tablet   Commonly known as: ULTRAM   1-2 po 6 hours prn for pain management, would be best to minimize use as this can amplify the level of pain you actually feel.

## 2011-08-25 NOTE — Progress Notes (Signed)
Patient Discharge Instructions:  Admission Note Faxed,  08/24/2011 Discharge Note Faxed,   08/24/2011 After Visit Summary Faxed,  08/24/2011 Faxed to the [facility] Care provider:  08/24/2011 D/C Summary faxed 08/24/2011 Facesheet faxed 08/24/2011   Faxed to [facility] @ [phone]  Jola Baptist, 08/25/2011, 11:12 AM

## 2011-08-28 ENCOUNTER — Other Ambulatory Visit: Payer: Self-pay | Admitting: *Deleted

## 2011-08-28 ENCOUNTER — Ambulatory Visit: Payer: Self-pay | Admitting: Physical Therapy

## 2011-08-28 DIAGNOSIS — I729 Aneurysm of unspecified site: Secondary | ICD-10-CM

## 2011-08-30 ENCOUNTER — Ambulatory Visit: Payer: Self-pay | Admitting: Physical Therapy

## 2011-09-01 ENCOUNTER — Ambulatory Visit: Payer: Self-pay | Attending: Orthopedic Surgery | Admitting: Physical Therapy

## 2011-09-01 DIAGNOSIS — R5381 Other malaise: Secondary | ICD-10-CM | POA: Insufficient documentation

## 2011-09-01 DIAGNOSIS — M25619 Stiffness of unspecified shoulder, not elsewhere classified: Secondary | ICD-10-CM | POA: Insufficient documentation

## 2011-09-01 DIAGNOSIS — IMO0001 Reserved for inherently not codable concepts without codable children: Secondary | ICD-10-CM | POA: Insufficient documentation

## 2011-09-01 DIAGNOSIS — M6281 Muscle weakness (generalized): Secondary | ICD-10-CM | POA: Insufficient documentation

## 2011-09-01 DIAGNOSIS — M25519 Pain in unspecified shoulder: Secondary | ICD-10-CM | POA: Insufficient documentation

## 2011-09-04 ENCOUNTER — Ambulatory Visit: Payer: Self-pay | Admitting: Physical Therapy

## 2011-09-06 ENCOUNTER — Ambulatory Visit: Payer: Self-pay | Admitting: Physical Therapy

## 2011-09-07 ENCOUNTER — Encounter: Payer: Self-pay | Admitting: Cardiovascular Disease

## 2011-09-08 ENCOUNTER — Encounter: Payer: Self-pay | Admitting: Physical Therapy

## 2011-09-11 ENCOUNTER — Ambulatory Visit (HOSPITAL_COMMUNITY)
Admission: RE | Admit: 2011-09-11 | Discharge: 2011-09-11 | Disposition: A | Discharge status: Home / Self Care | Payer: Self-pay | Source: Ambulatory Visit | Attending: Cardiovascular Disease | Admitting: Cardiovascular Disease

## 2011-09-11 DIAGNOSIS — I729 Aneurysm of unspecified site: Secondary | ICD-10-CM

## 2011-09-11 DIAGNOSIS — R0602 Shortness of breath: Secondary | ICD-10-CM | POA: Insufficient documentation

## 2011-09-11 DIAGNOSIS — I77819 Aortic ectasia, unspecified site: Secondary | ICD-10-CM | POA: Insufficient documentation

## 2011-09-11 MED ORDER — GADOBENATE DIMEGLUMINE 529 MG/ML IV SOLN
18.0000 mL | Freq: Once | INTRAVENOUS | Status: AC
  Administered 2011-09-11: 18 mL via INTRAVENOUS

## 2011-09-20 ENCOUNTER — Encounter: Payer: Self-pay | Admitting: *Deleted

## 2011-10-05 ENCOUNTER — Other Ambulatory Visit: Payer: Self-pay | Admitting: Family Medicine

## 2011-10-05 ENCOUNTER — Other Ambulatory Visit: Payer: Self-pay | Admitting: Cardiology

## 2011-11-07 ENCOUNTER — Other Ambulatory Visit: Payer: Self-pay | Admitting: Family Medicine

## 2011-11-07 ENCOUNTER — Telehealth: Payer: Self-pay | Admitting: *Deleted

## 2011-11-07 ENCOUNTER — Ambulatory Visit: Payer: Self-pay | Admitting: Family Medicine

## 2011-11-07 ENCOUNTER — Other Ambulatory Visit: Payer: Self-pay | Admitting: Cardiology

## 2011-11-07 NOTE — Telephone Encounter (Signed)
cancelled

## 2011-11-07 NOTE — Telephone Encounter (Signed)
Would not charge late fee.

## 2011-11-07 NOTE — Telephone Encounter (Signed)
VM left on personally identified cell letting him know of a broken appt today, asking him to call with reason and re-schedule OV

## 2011-11-14 NOTE — Progress Notes (Signed)
11/13/2011   Counselor received a voice mail from Bradley [name]'s sister, Bradley [name], who he had given consent to speak with at his last admission. Counselor listened to concerns listed by Bradley [name], who stated that she and Bradley [name]'s adult children have become concerned about his state of mind. She stated that he seems very depressed, has stopped functioning as he was and is sleeping a lot. Bradley [name] stated that he has not said that he is considering suicide, and she does not believe he is a harm to himself or anyone else. However, [place] and has not seen him lately. Bradley [name] stated that Bradley [name]'s son, Bradley [name], has called her with concerns as well, and has tried to get Bradley [name] to seek treatment. Since she encouraged Bradley [name] to come to [name] for help, he has not answerer her phone calls, which she believes is because he did not follow through the way she encouraged him to. She indicated that Bradley [name] went to a doctor appointment at [name], but at the second appointment the doctor called in sick and the appointment was rescheduled for weeks later, by which time Bradley [name] was not getting out of bed. She stated that Bradley [name] has told her similar things about counseling follow up - that he went as a walk-in and was never seen. Bradley [name] believes that a medication change was made when Bradley [name] went to [facility] and that he has gotten more depressed since then.   Counselor explained to Bradley [name] the options of having Bradley [name] walk in to [name] or go to the emergency room, or calling the Mobile Crisis Unit from [facility]. Counselor informed Bradley [name] that if she and Bradley [name]'s children believe he is a danger to himself or others, they can go to the [employer] office and file commitment papers. Bradley [name] again stated that she does not think Bradley [name] is suicidal or homicidal. She stated that she would encourage Bradley [name] to bring Bradley [name] to [facility] or the emergency department tomorrow (11/14/2011), and that if Bradley [name] will not come Bradley [name] will call the mobile crisis unit.  No personal information about [name] was given by counselor in this phone call, only general steps that may be taken if a loved one is concerned about one's mental health.   Bradley [name] 11/13/2011       4:07PM

## 2011-12-02 ENCOUNTER — Other Ambulatory Visit: Payer: Self-pay | Admitting: Family Medicine

## 2012-03-04 ENCOUNTER — Other Ambulatory Visit: Payer: Self-pay | Admitting: Family Medicine

## 2012-04-01 ENCOUNTER — Other Ambulatory Visit: Payer: Self-pay | Admitting: Family Medicine

## 2012-04-03 ENCOUNTER — Telehealth: Payer: Self-pay | Admitting: Family Medicine

## 2012-04-03 MED ORDER — LEVOTHYROXINE SODIUM 175 MCG PO TABS: 175.0000 ug | ORAL_TABLET | Freq: Every day | ORAL | Status: DC

## 2012-04-03 NOTE — Telephone Encounter (Signed)
Patient called stating that he need a refill of his synthroid sent to [facility] and an appt has been scheduled for 04/08/12. Please assist.

## 2012-04-08 ENCOUNTER — Encounter: Payer: Self-pay | Admitting: Family Medicine

## 2012-04-08 ENCOUNTER — Ambulatory Visit (INDEPENDENT_AMBULATORY_CARE_PROVIDER_SITE_OTHER): Payer: Self-pay | Admitting: Family Medicine

## 2012-04-08 VITALS — BP 120/90 | Temp 98.1°F | Wt 209.0 lb

## 2012-04-08 DIAGNOSIS — F329 Major depressive disorder, single episode, unspecified: Secondary | ICD-10-CM

## 2012-04-08 DIAGNOSIS — F3289 Other specified depressive episodes: Secondary | ICD-10-CM

## 2012-04-08 DIAGNOSIS — I10 Essential (primary) hypertension: Secondary | ICD-10-CM

## 2012-04-08 DIAGNOSIS — E039 Hypothyroidism, unspecified: Secondary | ICD-10-CM

## 2012-04-08 MED ORDER — LISINOPRIL 10 MG PO TABS: 10.0000 mg | ORAL_TABLET | Freq: Every day | ORAL | Status: DC

## 2012-04-08 MED ORDER — LEVOTHYROXINE SODIUM 175 MCG PO TABS: 175.0000 ug | ORAL_TABLET | Freq: Every day | ORAL | Status: DC

## 2012-04-08 MED ORDER — METOPROLOL TARTRATE 25 MG PO TABS: ORAL_TABLET | ORAL | Status: DC

## 2012-04-08 NOTE — Progress Notes (Signed)
  Subjective:    Patient ID: Bradley [name], male    DOB: 1956/01/13, 56 y.o.   MRN: [id number]  HPI  Medical followup. Last visit patient had some homicidal ideation and suicidal ideation. We had him admitted to behavioral health. He is now seeing psychiatry regularly. He is much more stable with stable anxiety and depression. They added Cogentin 1 mg daily to his sertraline. He has not had any further suicidal or homicidal ideation. His hypertension is treated with lisinopril. He has hypothyroidism treated with levothyroxine. He also takes metoprolol 25 mg one tablet twice daily. He has history of aortic valve replacement and has done well since then. He had rotator cuff repair over a year ago. Still has some weakness right shoulder and occasional pain. He is currently unemployed. No recent dizziness, syncope, chest pains, or peripheral edema.  Past Medical History  Diagnosis Date  . Anxiety   . Depression   . History of thyroid cancer   . Hypertension    Past Surgical History  Procedure Date  . Cardiac catheterization 03/02/2010    NORMAL CORONARY ARTERY  . Sternotomy     REDO  . Transthoracic echocardiogram 03/2010    SHOWED MILD REDUCTION OF LV FUNCTION  . Thyroidectomy   . Rotator cuff repair 2012    Right    reports that he has never smoked. He has never used smokeless tobacco. He reports that he does not drink alcohol or use illicit drugs. family history includes Heart disease in his father. No Known Allergies   Review of Systems  Constitutional: Negative for fever, chills and fatigue.  Eyes: Negative for visual disturbance.  Respiratory: Negative for cough, chest tightness and shortness of breath.   Cardiovascular: Negative for chest pain, palpitations and leg swelling.  Gastrointestinal: Negative for abdominal pain.  Genitourinary: Negative for dysuria.  Neurological: Negative for dizziness, syncope, weakness, light-headedness and headaches.  Psychiatric/Behavioral:  Negative for confusion, dysphoric mood and agitation.       Objective:   Physical Exam  Constitutional: He appears well-developed and well-nourished.  Neck: Neck supple. No thyromegaly present.  Cardiovascular: Normal rate and regular rhythm.   Murmur heard.      Patient has systolic murmur right upper sternal border and left sternal border as previously noted  Pulmonary/Chest: Effort normal and breath sounds normal. No respiratory distress. He has no wheezes. He has no rales.  Musculoskeletal: He exhibits no edema.  Lymphadenopathy:    He has no cervical adenopathy.          Assessment & Plan:  #1 hypothyroidism. Recent TSH in January at goal. Recheck in followup in 6 months. Refill medication for one year #2 hypertension. Stable refill lisinopril and Toprol for one year  #3 history of depression currently stable. Continue close followup with psychiatry

## 2012-04-10 ENCOUNTER — Other Ambulatory Visit: Payer: Self-pay | Admitting: *Deleted

## 2012-04-10 NOTE — Telephone Encounter (Signed)
This should be per prescribing provider unless they wish to transfer to use

## 2012-04-10 NOTE — Telephone Encounter (Signed)
I left a VM on pt home phone re: med being filled by prescribing provider

## 2012-04-10 NOTE — Telephone Encounter (Signed)
Received faxed request to fill Trazadone 100 mg, take one tab at HS, #30 with 0 refills last filled on 02-29-12 by Ali Lowe CNP. We do not have any history of pt being on this med.  Pharmacist reports pt asked to send to Dr Elease Hashimoto.

## 2012-04-18 ENCOUNTER — Telehealth: Payer: Self-pay | Admitting: *Deleted

## 2012-04-18 NOTE — Telephone Encounter (Signed)
Please clarify where he has gotten this.

## 2012-04-18 NOTE — Telephone Encounter (Signed)
Left a message for return call.  

## 2012-04-18 NOTE — Telephone Encounter (Signed)
Trazodone refill request.  Med is not on active or historical med list.  Looks like last filled by Ali Lowe, MD?  Please advise

## 2012-04-19 NOTE — Telephone Encounter (Signed)
This has been address earlier, pt had last filled by another provider.  He soul follow-up with that provider for refills

## 2012-05-01 ENCOUNTER — Telehealth: Payer: Self-pay | Admitting: Family Medicine

## 2012-05-01 MED ORDER — LEVOTHYROXINE SODIUM 175 MCG PO TABS: 175.0000 ug | ORAL_TABLET | Freq: Every day | ORAL | Status: DC

## 2012-05-01 NOTE — Telephone Encounter (Signed)
Patient called stating that he need a refill of his levothyroxine sent to [facility] on [address] as he has to go to [place]. Please assist.

## 2012-10-07 ENCOUNTER — Encounter: Payer: Self-pay | Admitting: Family Medicine

## 2012-10-07 ENCOUNTER — Ambulatory Visit (INDEPENDENT_AMBULATORY_CARE_PROVIDER_SITE_OTHER): Payer: Self-pay | Admitting: Family Medicine

## 2012-10-07 VITALS — BP 100/68 | Temp 98.1°F | Wt 212.0 lb

## 2012-10-07 DIAGNOSIS — I10 Essential (primary) hypertension: Secondary | ICD-10-CM

## 2012-10-07 DIAGNOSIS — E039 Hypothyroidism, unspecified: Secondary | ICD-10-CM

## 2012-10-07 LAB — BASIC METABOLIC PANEL
BUN: 16 mg/dL (ref 6–23)
CO2: 29 mEq/L (ref 19–32)
Calcium: 9.5 mg/dL (ref 8.4–10.5)
Chloride: 108 mEq/L (ref 96–112)
Creatinine, Ser: 0.9 mg/dL (ref 0.4–1.5)
GFR: 99.22 mL/min (ref >60.00)
Glucose, Bld: 98 mg/dL (ref 70–99)
Potassium: 4.7 mEq/L (ref 3.5–5.1)
Sodium: 141 mEq/L (ref 135–145)

## 2012-10-07 LAB — TSH: TSH: 3.36 u[IU]/mL (ref 0.35–5.50)

## 2012-10-07 NOTE — Progress Notes (Signed)
  Subjective:    Patient ID: Bradley [name], male    DOB: 05/29/1956, 57 y.o.   MRN: [id number]  HPI Patient seen for medical followup. He has history of aortic stenosis with aortic valve replacement, hypertension, depression, generalized anxiety disorder, left bundle branch block, and hypothyroidism. He is followed by psychiatry. He is currently being evaluated for disability.  Medications reviewed. Compliant with all. No recent chest pains. No dizziness. No headaches. Has not had thyroid assessment and over one year. Weight and appetite are stable.  Past Medical History  Diagnosis Date  . Anxiety   . Depression   . History of thyroid cancer   . Hypertension    Past Surgical History  Procedure Laterality Date  . Cardiac catheterization  03/02/2010    NORMAL CORONARY ARTERY  . Sternotomy      REDO  . Transthoracic echocardiogram  03/2010    SHOWED MILD REDUCTION OF LV FUNCTION  . Thyroidectomy    . Rotator cuff repair  2012    Right    reports that he has never smoked. He has never used smokeless tobacco. He reports that he does not drink alcohol or use illicit drugs. family history includes Heart disease in his father. No Known Allergies    Review of Systems  Constitutional: Negative for fatigue.  Eyes: Negative for visual disturbance.  Respiratory: Negative for cough, chest tightness and shortness of breath.   Cardiovascular: Negative for chest pain, palpitations and leg swelling.  Neurological: Negative for dizziness, syncope, weakness, light-headedness and headaches.       Objective:   Physical Exam  Constitutional: He appears well-developed and well-nourished. No distress.  Neck: Neck supple. No thyromegaly present.  Cardiovascular: Normal rate and regular rhythm.   Murmur heard. Pulmonary/Chest: Effort normal and breath sounds normal. No respiratory distress. He has no wheezes. He has no rales.  Musculoskeletal: He exhibits no edema.          Assessment &  Plan:  #1 hypertension. Stable and at goal. Continue current medications #2 hypothyroidism. Check TSH #3 depression. Followed by psychiatry #4 history of aortic valve replacement secondary to aortic valve stenosis.

## 2012-10-08 NOTE — Progress Notes (Signed)
Quick Note:  Pt informed on home VM ______ 

## 2012-11-06 ENCOUNTER — Ambulatory Visit (INDEPENDENT_AMBULATORY_CARE_PROVIDER_SITE_OTHER): Payer: Self-pay | Admitting: Family Medicine

## 2012-11-06 ENCOUNTER — Telehealth: Payer: Self-pay | Admitting: Family Medicine

## 2012-11-06 ENCOUNTER — Encounter: Payer: Self-pay | Admitting: Family Medicine

## 2012-11-06 VITALS — BP 120/78 | Temp 98.5°F | Wt 204.0 lb

## 2012-11-06 DIAGNOSIS — M25519 Pain in unspecified shoulder: Secondary | ICD-10-CM

## 2012-11-06 DIAGNOSIS — S9032XA Contusion of left foot, initial encounter: Secondary | ICD-10-CM

## 2012-11-06 DIAGNOSIS — M25512 Pain in left shoulder: Secondary | ICD-10-CM

## 2012-11-06 DIAGNOSIS — S9030XA Contusion of unspecified foot, initial encounter: Secondary | ICD-10-CM

## 2012-11-06 MED ORDER — NAPROXEN 500 MG PO TABS: 500.0000 mg | ORAL_TABLET | Freq: Two times a day (BID) | ORAL | Status: DC

## 2012-11-06 NOTE — Telephone Encounter (Signed)
Patient Information:  Caller Name: Erick  Phone: [phone]  Patient: Bradley [name], Bradley [name]  Gender: Male  DOB: 09-26-1955  Age: 57 Years  PCP: [name] (Family Practice)  Office Follow Up:  Does the office need to follow up with this patient?: Yes  Instructions For The Office: If there are any earlier appts with Dr. Burchette/cancellations please call him   Symptoms  Reason For Call & Symptoms: Pt fell 2 days ago  at home and hurt his shoulder. Today the pt can't life his arm. Pt wants an appt.  Reviewed Health History In EMR: Yes  Reviewed Medications In EMR: Yes  Reviewed Allergies In EMR: Yes  Reviewed Surgeries / Procedures: Yes  Date of Onset of Symptoms: 11/04/2012  Guideline(s) Used:  Arm Injury  Shoulder Injury  Disposition Per Guideline:   See Today in Office  Reason For Disposition Reached:   Can't move injured shoulder normally (e.g., full range of motion, able to touch top of head)  Advice Given:  N/A  Patient Will Follow Care Advice:  YES  Appointment Scheduled:  11/06/2012 16:00:00 Appointment Scheduled Provider:  [name] (Family Practice)

## 2012-11-06 NOTE — Progress Notes (Signed)
  Subjective:    Patient ID: Bradley [name], male    DOB: 09-Nov-1955, 57 y.o.   MRN: [id number]  HPI Patient seen with left shoulder pain. 3 days ago Sunday he was walking on his deck and thinks he tripped on a mat. He fell and is not sure exactly how he landed but apparently on his left shoulder. He has moderate to severe left shoulder pain. No neck pain. Pain with abduction, internal rotation, and external rotation. Prior history of right rotator cuff tear and symptoms currently feels similar. He's taken occasional ibuprofen with minimal relief. Significant night pain. No ecchymosis.  Patient noticed some mild bruising dorsum left foot but no pain with ambulation. No head injury. No other reported injuries.  Patient has past history of aortic valve replacement. He takes aspirin but no other anticoagulants  Past Medical History  Diagnosis Date  . Anxiety   . Depression   . History of thyroid cancer   . Hypertension   . Thyroid disease     hypothyroidism   Past Surgical History  Procedure Laterality Date  . Cardiac catheterization  03/02/2010    NORMAL CORONARY ARTERY  . Sternotomy      REDO  . Transthoracic echocardiogram  03/2010    SHOWED MILD REDUCTION OF LV FUNCTION  . Thyroidectomy    . Rotator cuff repair  2012    Right    reports that he has never smoked. He has never used smokeless tobacco. He reports that he does not drink alcohol or use illicit drugs. family history includes Heart disease in his father. No Known Allergies    Review of Systems  Constitutional: Negative for fever, chills, appetite change and unexpected weight change.  Musculoskeletal: Negative for back pain, joint swelling and gait problem.  Neurological: Negative for numbness.       Objective:   Physical Exam  Constitutional: He appears well-developed and well-nourished.  Cardiovascular: Normal rate and regular rhythm.   Pulmonary/Chest: Effort normal and breath sounds normal. No respiratory  distress. He has no wheezes. He has no rales.  Musculoskeletal:  Left shoulder reveals limited range of motion secondary to pain. He can abduct about 70. No visible ecchymosis or swelling proximal humerus or around the shoulder joint. No clavicle tenderness.  Left foot reveals mild ecchymosis dorsally. Minimal tenderness distal fourth metatarsal. No problems with weightbearing. No edema          Assessment & Plan:  #1 acute left shoulder pain following fall.  Weakness is difficult to assess secondary to pain. Start with some plain x-rays. Hopefully he does not have rotator cuff tear. Naproxen 500 mg twice a day with food. We'll plan to touch base in 2 weeks if no further improvements  #2 contusion left foot. We discussed x-rays at this point and is not interested. He is weightbearing without difficulty. Doubt acute bony injury.

## 2012-11-06 NOTE — Telephone Encounter (Signed)
Pt coming in at 4pm today

## 2012-11-12 ENCOUNTER — Ambulatory Visit (HOSPITAL_COMMUNITY)
Admission: RE | Admit: 2012-11-12 | Discharge: 2012-11-12 | Disposition: A | Discharge status: Home / Self Care | Payer: Self-pay | Source: Ambulatory Visit | Attending: Family Medicine | Admitting: Family Medicine

## 2012-11-12 DIAGNOSIS — M25512 Pain in left shoulder: Secondary | ICD-10-CM

## 2012-11-12 DIAGNOSIS — M25519 Pain in unspecified shoulder: Secondary | ICD-10-CM | POA: Insufficient documentation

## 2012-11-14 NOTE — Progress Notes (Signed)
Quick Note:  Pt informed on personally identified VM ______ 

## 2012-12-03 ENCOUNTER — Encounter: Payer: Self-pay | Admitting: Cardiology

## 2012-12-13 ENCOUNTER — Encounter: Payer: Self-pay | Admitting: Family Medicine

## 2012-12-13 ENCOUNTER — Ambulatory Visit (INDEPENDENT_AMBULATORY_CARE_PROVIDER_SITE_OTHER): Payer: Self-pay | Admitting: Family Medicine

## 2012-12-13 VITALS — BP 140/80 | Temp 98.3°F | Wt 203.0 lb

## 2012-12-13 DIAGNOSIS — F22 Delusional disorders: Secondary | ICD-10-CM

## 2012-12-13 DIAGNOSIS — F32A Depression, unspecified: Secondary | ICD-10-CM

## 2012-12-13 DIAGNOSIS — F329 Major depressive disorder, single episode, unspecified: Secondary | ICD-10-CM

## 2012-12-13 DIAGNOSIS — F3289 Other specified depressive episodes: Secondary | ICD-10-CM

## 2012-12-13 NOTE — Progress Notes (Signed)
  Subjective:    Patient ID: Bradley [name], male    DOB: 17-Jun-1956, 57 y.o.   MRN: [id number]  HPI Patient seen today with complaints of difficulty sleeping and violent nightmares over the past couple of weeks. He has history of depression and is followed by psychiatrist. He is currently treated with Zoloft and trazodone and states that his Zoloft was recently increased. The past couple of weeks he's had great difficulty sleeping and frequent nightmares. He is describing some increased paranoia and also possibly some delusions/hallucinations. For example, he states he woke up from one dream and saw a light fixture coming at him. One day he saw a shadow and started talking to the shadow. He denies any current suicidal ideation.  He does not have any known history of bipolar disorder. Recent TSH normal. Denies any alcohol or drug abuse  Past Medical History  Diagnosis Date  . Anxiety   . Depression   . History of thyroid cancer   . Hypertension   . Thyroid disease     hypothyroidism   Past Surgical History  Procedure Laterality Date  . Cardiac catheterization  03/02/2010    NORMAL CORONARY ARTERY  . Sternotomy      REDO  . Transthoracic echocardiogram  03/2010    SHOWED MILD REDUCTION OF LV FUNCTION  . Thyroidectomy    . Rotator cuff repair  2012    Right    reports that he has never smoked. He has never used smokeless tobacco. He reports that he does not drink alcohol or use illicit drugs. family history includes Heart disease in his father. No Known Allergies    Review of Systems  Constitutional: Negative for fever, chills and appetite change.  Respiratory: Negative for cough and shortness of breath.   Cardiovascular: Negative for chest pain.  Neurological: Negative for headaches.  Psychiatric/Behavioral: Positive for sleep disturbance, dysphoric mood and agitation. Negative for suicidal ideas and self-injury. The patient is nervous/anxious.        Objective:   Physical Exam  Constitutional: He is oriented to person, place, and time. He appears well-developed and well-nourished.  Patient is alert and cooperative but appears somewhat anxious. Answers all questions appropriately  Cardiovascular: Normal rate and regular rhythm.   Pulmonary/Chest: Effort normal and breath sounds normal. No respiratory distress. He has no wheezes. He has no rales.  Neurological: He is alert and oriented to person, place, and time.          Assessment & Plan:  History of depression. Patient presents with 2 week history of progressive sleep disturbance and intermittent agitation and gives history of exhibiting some increased paranoid thinking and probably some delusional thinking as well. We'll contact his psychiatrist for further evaluation.

## 2012-12-16 ENCOUNTER — Other Ambulatory Visit: Payer: Self-pay | Admitting: Family Medicine

## 2013-03-28 ENCOUNTER — Other Ambulatory Visit: Payer: Self-pay | Admitting: Family Medicine

## 2013-04-09 ENCOUNTER — Encounter: Payer: Self-pay | Admitting: Family Medicine

## 2013-04-09 ENCOUNTER — Ambulatory Visit (INDEPENDENT_AMBULATORY_CARE_PROVIDER_SITE_OTHER): Payer: Self-pay | Admitting: Family Medicine

## 2013-04-09 VITALS — BP 132/76 | HR 89 | Temp 98.2°F | Ht 73.0 in | Wt 203.0 lb

## 2013-04-09 DIAGNOSIS — M25519 Pain in unspecified shoulder: Secondary | ICD-10-CM

## 2013-04-09 DIAGNOSIS — S43429A Sprain of unspecified rotator cuff capsule, initial encounter: Secondary | ICD-10-CM

## 2013-04-09 DIAGNOSIS — M25512 Pain in left shoulder: Secondary | ICD-10-CM

## 2013-04-09 DIAGNOSIS — M75102 Unspecified rotator cuff tear or rupture of left shoulder, not specified as traumatic: Secondary | ICD-10-CM

## 2013-04-09 NOTE — Progress Notes (Addendum)
  Subjective:    Patient ID: Bradley [name], male    DOB: 04/10/1956, 57 y.o.   MRN: [id number]  HPI Patient seen with ongoing left shoulder pains. He had previous right rotator cuff tear and states these symptoms are similar. He had fall back in April. He slipped on his deck and fell. Had ongoing pain since then. Significant night pain. He has significant weakness involving abduction and internal rotation. Denies any cervical neck pain. Pain not relieved with over-the-counter medications.  Past Medical History  Diagnosis Date  . Anxiety   . Depression   . History of thyroid cancer   . Hypertension   . Thyroid disease     hypothyroidism   Past Surgical History  Procedure Laterality Date  . Cardiac catheterization  03/02/2010    NORMAL CORONARY ARTERY  . Sternotomy      REDO  . Transthoracic echocardiogram  03/2010    SHOWED MILD REDUCTION OF LV FUNCTION  . Thyroidectomy    . Rotator cuff repair  2012    Right    reports that he has never smoked. He has never used smokeless tobacco. He reports that he does not drink alcohol or use illicit drugs. family history includes Heart disease in his father. No Known Allergies    Review of Systems  HENT: Negative for neck pain.   Respiratory: Negative for shortness of breath.   Cardiovascular: Negative for chest pain.  Neurological: Negative for numbness.       Objective:   Physical Exam  Constitutional: He appears well-developed and well-nourished.  Cardiovascular: Normal rate and regular rhythm.   Murmur heard. Pulmonary/Chest: Effort normal and breath sounds normal. No respiratory distress. He has no wheezes. He has no rales.  Musculoskeletal:  Left shoulder reveals no obvious edema. He has definite weakness with external rotation and abduction. No a.c. joint tenderness. No biceps tenderness. Deep tendon reflexes are symmetric. Grip strengths are normal.          Assessment & Plan:  Persistent left shoulder pain  following fall. Clinical suspicion is high for rotator cuff tear. MRI left shoulder to further assess. Will probably need orthopedic referral  MRI confirms rotator cuff tear.  Will set up ortho referral.  Unfortunately, he has significant atrophy given duration since his injury.

## 2013-04-11 ENCOUNTER — Telehealth: Payer: Self-pay | Admitting: Family Medicine

## 2013-04-11 NOTE — Telephone Encounter (Signed)
Patient called and left message for nurse to call regarding swollen legs.  Called and received no answer.  Left message.

## 2013-04-11 NOTE — Telephone Encounter (Signed)
PT called and stated that [facility] imaging has contacted him to schedule his MRI, but that he needs to have this complete at [facility] because he is insured with him. Please assist.

## 2013-04-11 NOTE — Telephone Encounter (Signed)
Bradley [name] is not here today

## 2013-04-14 NOTE — Telephone Encounter (Signed)
Pt appt rescheduled at [facility] long. Pt aware

## 2013-04-16 ENCOUNTER — Other Ambulatory Visit: Payer: Self-pay

## 2013-04-18 ENCOUNTER — Ambulatory Visit (HOSPITAL_COMMUNITY)
Admission: RE | Admit: 2013-04-18 | Discharge: 2013-04-18 | Disposition: A | Discharge status: Home / Self Care | Payer: Medicaid Other | Source: Ambulatory Visit | Attending: Family Medicine | Admitting: Family Medicine

## 2013-04-18 ENCOUNTER — Telehealth: Payer: Self-pay

## 2013-04-18 DIAGNOSIS — S46819A Strain of other muscles, fascia and tendons at shoulder and upper arm level, unspecified arm, initial encounter: Secondary | ICD-10-CM | POA: Insufficient documentation

## 2013-04-18 DIAGNOSIS — M25419 Effusion, unspecified shoulder: Secondary | ICD-10-CM | POA: Insufficient documentation

## 2013-04-18 DIAGNOSIS — M6250 Muscle wasting and atrophy, not elsewhere classified, unspecified site: Secondary | ICD-10-CM | POA: Insufficient documentation

## 2013-04-18 DIAGNOSIS — W19XXXA Unspecified fall, initial encounter: Secondary | ICD-10-CM | POA: Insufficient documentation

## 2013-04-18 DIAGNOSIS — S46919A Strain of unspecified muscle, fascia and tendon at shoulder and upper arm level, unspecified arm, initial encounter: Secondary | ICD-10-CM | POA: Insufficient documentation

## 2013-04-18 DIAGNOSIS — M25512 Pain in left shoulder: Secondary | ICD-10-CM

## 2013-04-18 MED ORDER — LISINOPRIL 10 MG PO TABS: 10.0000 mg | ORAL_TABLET | Freq: Every day | ORAL | Status: DC

## 2013-04-18 NOTE — Telephone Encounter (Signed)
rx sent to pharmacy

## 2013-04-20 NOTE — Addendum Note (Signed)
Addended by: [name] on: 04/20/2013 07:32 AM   Modules accepted: Orders

## 2013-04-23 ENCOUNTER — Other Ambulatory Visit: Payer: Self-pay | Admitting: Family Medicine

## 2013-05-06 ENCOUNTER — Ambulatory Visit (INDEPENDENT_AMBULATORY_CARE_PROVIDER_SITE_OTHER): Payer: Self-pay | Admitting: Physician Assistant

## 2013-05-06 ENCOUNTER — Encounter: Payer: Self-pay | Admitting: Physician Assistant

## 2013-05-06 ENCOUNTER — Ambulatory Visit: Payer: Self-pay | Admitting: Physician Assistant

## 2013-05-06 VITALS — BP 118/70 | HR 64 | Ht 72.0 in | Wt 204.4 lb

## 2013-05-06 DIAGNOSIS — I10 Essential (primary) hypertension: Secondary | ICD-10-CM

## 2013-05-06 DIAGNOSIS — K921 Melena: Secondary | ICD-10-CM

## 2013-05-06 DIAGNOSIS — Z01810 Encounter for preprocedural cardiovascular examination: Secondary | ICD-10-CM

## 2013-05-06 DIAGNOSIS — Z954 Presence of other heart-valve replacement: Secondary | ICD-10-CM

## 2013-05-06 DIAGNOSIS — I359 Nonrheumatic aortic valve disorder, unspecified: Secondary | ICD-10-CM

## 2013-05-06 DIAGNOSIS — Z952 Presence of prosthetic heart valve: Secondary | ICD-10-CM

## 2013-05-06 LAB — CBC WITH DIFFERENTIAL/PLATELET
Basophils Absolute: 0.1 10*3/uL (ref 0.0–0.1)
Basophils Relative: 0.7 % (ref 0.0–3.0)
Eosinophils Absolute: 0.2 10*3/uL (ref 0.0–0.7)
Eosinophils Relative: 2.4 % (ref 0.0–5.0)
HCT: 42.3 % (ref 39.0–52.0)
Hemoglobin: 14.4 g/dL (ref 13.0–17.0)
Lymphocytes Relative: 18.6 % (ref 12.0–46.0)
Lymphs Abs: 1.7 10*3/uL (ref 0.7–4.0)
MCHC: 34 g/dL (ref 30.0–36.0)
MCV: 87.4 fl (ref 78.0–100.0)
Monocytes Absolute: 0.8 10*3/uL (ref 0.1–1.0)
Monocytes Relative: 8.4 % (ref 3.0–12.0)
Neutro Abs: 6.4 10*3/uL (ref 1.4–7.7)
Neutrophils Relative %: 69.9 % (ref 43.0–77.0)
Platelets: 171 10*3/uL (ref 150.0–400.0)
RBC: 4.84 Mil/uL (ref 4.22–5.81)
RDW: 13.9 % (ref 11.5–14.6)
WBC: 9.1 10*3/uL (ref 4.5–10.5)

## 2013-05-06 LAB — BASIC METABOLIC PANEL
BUN: 19 mg/dL (ref 6–23)
CO2: 31 mEq/L (ref 19–32)
Calcium: 9.3 mg/dL (ref 8.4–10.5)
Chloride: 105 mEq/L (ref 96–112)
Creatinine, Ser: 1 mg/dL (ref 0.4–1.5)
GFR: 86.04 mL/min (ref >60.00)
Glucose, Bld: 91 mg/dL (ref 70–99)
Potassium: 5.5 mEq/L — ABNORMAL HIGH (ref 3.5–5.1)
Sodium: 141 mEq/L (ref 135–145)

## 2013-05-06 NOTE — Patient Instructions (Addendum)
LABS TODAY; BMET, CBC W/DIFF  PLEASE SCHEDULE TO HAVE AN ECHO  Your physician wants you to follow-up in: [place]. Johnsie Cancel. You will receive a reminder letter in the mail two months in advance. If you don't receive a letter, please call our office to schedule the follow-up appointment.  PER SCOTT WEAVER, PAC YOU NEED TO BE SEEN BY DR. Elease Hashimoto THIS WEEK FOR HEMATOCHEZIA;  YOU HAVE AN APPT 05/07/13 @ 2:30 WITH DR. Elease Hashimoto

## 2013-05-06 NOTE — Progress Notes (Signed)
[address], [place], [place]  [address] Phone: [phone] Fax:  [phone]  Date:  05/06/2013   ID:  Bradley [name], DOB 10/04/1955, MRN [id number]  PCP:  [name], MD  Cardiologist:  Dr. Jenkins Rouge     History of Present Illness: Bradley [name] is a 57 y.o. male who returns for surgical clearance.  He needs L shoulder arthroscopy with Dr. Mardelle Matte for rotator cuff tear.   He has a history of aortic stenosis requiring pediatric aortic valvulotomy at age 37 and subsequent Bentall procedure in 02/2000 with placement of the pericardial tissue here valve replacement. Cardiac catheterization 02/2010 demonstrated normal coronary arteries. Other history includes LBBB, thyroid cancer, status post thyroidectomy in 2005, surgical hypothyroidism, HTN, depression. Last echo (9/11): Moderate LVH, EF 45-50%, AVR functioning appropriately, aortic valve mean gradient 21, diastolic dysfunction. Chest MRA (2/13): Mild to moderate dilatation at the sinus of Valsalva at 4.1 cm, mild dilatation ascending aorta distal to the tube graft at 3.9 cm, moderate dilatation of the innominate artery a 2.1 cm.  Last seen by [name] 08/2011.  Since last seen, he is doing well.  Denies CP or significant DOE.  He has occ dyspnea with more extreme activities.  He is NYHA Class II.  Denies orthopnea, PND.  He has occ dependent edema.  No syncope.  He is very active and can achieve > 4 METs.    Labs (1/13):  K 3.7, creatinine 0.78, albumin 3.3, ALT 11, Hgb 11.9 Labs (3/14):  K 4.7, creatinine 0.9, TSH 3.36  Wt Readings from Last 3 Encounters:  04/09/13 203 lb (92.08 kg)  12/13/12 203 lb (92.08 kg)  11/06/12 204 lb (92.534 kg)     Past Medical History  Diagnosis Date  . Anxiety   . Depression   . History of thyroid cancer   . Hypertension   . Hypothyroidism, postsurgical   . Aortic stenosis     s/p Bentall with bioprosthetic AVR 02/2010; Last echo (9/11): Moderate LVH, EF 45-50%, AVR functioning  appropriately, aortic valve mean gradient 21, diastolic dysfunction. Chest MRA (2/13): Mild to moderate dilatation at the sinus of Valsalva at 4.1 cm, mild dilatation ascending aorta distal to the tube graft at 3.9 cm, moderate dilatation of the innominate artery a 2.1 cm.  Marland Kitchen Hx of cardiac cath     a. LHC in 02/2010: normal cors    Current Outpatient Prescriptions  Medication Sig Dispense Refill  . aspirin 325 MG tablet Take 325 mg by mouth daily.        Marland Kitchen levothyroxine (SYNTHROID, LEVOTHROID) 175 MCG tablet Take 1 tablet (175 mcg total) by mouth daily.  90 tablet  3  . lisinopril (PRINIVIL,ZESTRIL) 10 MG tablet Take 1 tablet (10 mg total) by mouth daily.  30 tablet  11  . metoprolol tartrate (LOPRESSOR) 25 MG tablet TAKE ONE-HALF TABLET BY MOUTH TWICE DAILY  30 tablet  11  . multivitamin (THERAGRAN) per tablet Take 1 tablet by mouth daily.        . naproxen (NAPROSYN) 500 MG tablet TAKE ONE TABLET BY MOUTH TWICE DAILY WITH A MEAL  60 tablet  5  . sertraline (ZOLOFT) 50 MG tablet TAKE ONE TABLET BY MOUTH EVERY DAY  30 tablet  5   No current facility-administered medications for this visit.    Allergies:   No Known Allergies  Social History:  The patient  reports that he has never smoked. He has never used smokeless tobacco.  He reports that he does not drink alcohol or use illicit drugs.   Family History:  The patient's family history includes Heart disease in his father.   ROS:  Please see the history of present illness.   He has had occ episodes of BRBPR.  He had a large amount of blood noted this AM.  It was painless.  He also notes occ black stools.  He has never had a colonoscopy.  He notes decrease energy and decreased appetite.   All other systems reviewed and negative.   PHYSICAL EXAM: VS:  BP 118/70  Pulse 64  Ht 6' (1.829 m)  Wt 204 lb 6.4 oz (92.715 kg)  BMI 27.72 kg/m2 Well nourished, well developed, in no acute distress HEENT: normal Neck: no JVD Cardiac:  normal S1, S2;  RRR; harsh 2/6 systolic murmur at the RUSB Lungs:  clear to auscultation bilaterally, no wheezing, rhonchi or rales Abd: soft, nontender, no hepatomegaly Ext: no edema Skin: warm and dry Neuro:  CNs 2-12 intact, no focal abnormalities noted  EKG:  NSR, HR 64, IVCD, no change from prior tracing     ASSESSMENT AND PLAN:  1. Surgical Clearance:  He had a normal cardiac cath prior to his AVR in 2011.  He is able to achieve > 4 METs without anginal symptoms.  It has been > 3 years since his last echo.  He does not require stress testing prior to his non-cardiac surgery.  I will arrange an echo to assess his AVR prior to making a final determination on his operative risk. If his gradients are stable, he will not need any further cardiac testing.  2. Aortic Stenosis, s/p Bentall Procedure with Bioprosthetic AVR:  Proceed with follow up echo as noted.  Continue SBE prophylaxis.   3. Hypertension:  Controlled.  Continue current therapy.  4. Hypothyroidism:  F/u with PCP. 5. Rectal Bleeding:  He had a significant episode of bleeding this AM.  Check BMET and CBC.  I will arrange close f/u with his PCP. 6. Disposition:  F/u with Dr. Jenkins Rouge in 6 mos.   Signed, Richardson Dopp, PA-C  05/06/2013 2:14 PM

## 2013-05-07 ENCOUNTER — Ambulatory Visit (INDEPENDENT_AMBULATORY_CARE_PROVIDER_SITE_OTHER): Payer: Self-pay | Admitting: Family Medicine

## 2013-05-07 ENCOUNTER — Encounter: Payer: Self-pay | Admitting: Family Medicine

## 2013-05-07 ENCOUNTER — Ambulatory Visit: Payer: Self-pay | Admitting: Family Medicine

## 2013-05-07 ENCOUNTER — Ambulatory Visit (INDEPENDENT_AMBULATORY_CARE_PROVIDER_SITE_OTHER): Payer: Self-pay | Admitting: *Deleted

## 2013-05-07 ENCOUNTER — Telehealth: Payer: Self-pay | Admitting: *Deleted

## 2013-05-07 VITALS — BP 136/68 | HR 84 | Temp 98.2°F | Wt 208.0 lb

## 2013-05-07 DIAGNOSIS — I10 Essential (primary) hypertension: Secondary | ICD-10-CM

## 2013-05-07 DIAGNOSIS — K602 Anal fissure, unspecified: Secondary | ICD-10-CM

## 2013-05-07 LAB — BASIC METABOLIC PANEL
BUN: 21 mg/dL (ref 6–23)
CO2: 27 mEq/L (ref 19–32)
Calcium: 9.4 mg/dL (ref 8.4–10.5)
Chloride: 106 mEq/L (ref 96–112)
Creatinine, Ser: 0.9 mg/dL (ref 0.4–1.5)
GFR: 92.69 mL/min (ref >60.00)
Glucose, Bld: 101 mg/dL — ABNORMAL HIGH (ref 70–99)
Potassium: 4.2 mEq/L (ref 3.5–5.1)
Sodium: 142 mEq/L (ref 135–145)

## 2013-05-07 NOTE — Progress Notes (Signed)
  Subjective:    Patient ID: Bradley [name], male    DOB: 1955/09/22, 57 y.o.   MRN: [id number]  HPI Patient seen was bright red blood per rectum. He states he's had about 4-5 episodes over the past 7 or 8 months. Most recently yesterday. This is described as bright blood. He has not any constipation or diarrhea. No perianal pains. No appetite or weight changes.  He has not had previous colonoscopy screening. He had recent lab work preoperatively for upcoming rotator cuff repair with hemoglobin yesterday 14.4. No history of hemorrhoids. No history of anal fissure. Denies any abdominal pain.  Past Medical History  Diagnosis Date  . Anxiety   . Depression   . History of thyroid cancer   . Hypertension   . Hypothyroidism, postsurgical   . Aortic stenosis     s/p Bentall with bioprosthetic AVR 02/2010; Last echo (9/11): Moderate LVH, EF 45-50%, AVR functioning appropriately, aortic valve mean gradient 21, diastolic dysfunction. Chest MRA (2/13): Mild to moderate dilatation at the sinus of Valsalva at 4.1 cm, mild dilatation ascending aorta distal to the tube graft at 3.9 cm, moderate dilatation of the innominate artery a 2.1 cm.  Marland Kitchen Hx of cardiac cath     a. LHC in 02/2010: normal cors   Past Surgical History  Procedure Laterality Date  . Cardiac catheterization  03/02/2010    NORMAL CORONARY ARTERY  . Sternotomy      REDO  . Transthoracic echocardiogram  03/2010    SHOWED MILD REDUCTION OF LV FUNCTION  . Thyroidectomy    . Rotator cuff repair  2012    Right    reports that he has never smoked. He has never used smokeless tobacco. He reports that he does not drink alcohol or use illicit drugs. family history includes Heart disease in his father. No Known Allergies    Review of Systems  Constitutional: Negative for appetite change and unexpected weight change.  Respiratory: Negative for shortness of breath.   Gastrointestinal: Positive for blood in stool. Negative for nausea, vomiting,  abdominal pain, diarrhea, constipation, abdominal distention and rectal pain.       Objective:   Physical Exam  Constitutional: He appears well-developed and well-nourished.  Cardiovascular: Normal rate.   Pulmonary/Chest: Effort normal and breath sounds normal. No respiratory distress. He has no wheezes. He has no rales.  Abdominal: Soft. There is no tenderness.  Genitourinary:  Patient has fairly large anal fissure at 12:00 position. No active bleeding. No external hemorrhoids. Digital exam and anoscopy were not performed given visible anal fissure          Assessment & Plan:  anal fissure. No associated pain. We discussed measures such as sitz baths and measures to reduce constipation. We discussed need for colonoscopy (age 40 and no prior screening) and he wants to get through his rotator cuff repair first.

## 2013-05-07 NOTE — Telephone Encounter (Signed)
pt notified about lab results and will have repeat bmet today. pt verbalized understanding to these results today

## 2013-05-07 NOTE — Patient Instructions (Signed)
Anal Fissure, Adult An anal fissure is a small tear or crack in the skin around the anus. Bleeding from a fissure usually stops on its own within a few minutes. However, bleeding will often reoccur with each bowel movement until the crack heals.  CAUSES   Passing large, hard stools.  Frequent diarrheal stools.  Constipation.  Inflammatory bowel disease (Crohn's disease or ulcerative colitis).  Infections.  Anal sex. SYMPTOMS   Small amounts of blood seen on your stools, on toilet paper, or in the toilet after a bowel movement.  Rectal bleeding.  Painful bowel movements.  Itching or irritation around the anus. DIAGNOSIS Your caregiver will examine the anal area. An anal fissure can usually be seen with careful inspection. A rectal exam may be performed and a short tube (anoscope) may be used to examine the anal canal. TREATMENT   You may be instructed to take fiber supplements. These supplements can soften your stool to help make bowel movements easier.  Sitz baths may be recommended to help heal the tear. Do not use soap in the sitz baths.  A medicated cream or ointment may be prescribed to lessen discomfort. HOME CARE INSTRUCTIONS   Maintain a diet high in fruits, whole grains, and vegetables. Avoid constipating foods like bananas and dairy products.  Take sitz baths as directed by your caregiver.  Drink enough fluids to keep your urine clear or pale yellow.  Only take over-the-counter or prescription medicines for pain, discomfort, or fever as directed by your caregiver. Do not take aspirin as this may increase bleeding.  Do not use ointments containing numbing medications (anesthetics) or hydrocortisone. They could slow healing. SEEK MEDICAL CARE IF:   Your fissure is not completely healed within 3 days.  You have further bleeding.  You have a fever.  You have diarrhea mixed with blood.  You have pain.  Your problem is getting worse rather than  better. MAKE SURE YOU:   Understand these instructions.  Will watch your condition.  Will get help right away if you are not doing well or get worse. Document Released: 07/17/2005 Document Revised: 10/09/2011 Document Reviewed: 01/01/2011 [facility] Patient Information 2014 [place], [place].  Consider stool softener such as colace or dulcolax to avoid constipation or straining.

## 2013-05-22 ENCOUNTER — Encounter: Payer: Self-pay | Admitting: Physician Assistant

## 2013-05-22 ENCOUNTER — Ambulatory Visit (HOSPITAL_COMMUNITY): Payer: Medicaid Other | Attending: Internal Medicine | Admitting: Radiology

## 2013-05-22 ENCOUNTER — Other Ambulatory Visit: Payer: Self-pay | Admitting: Family Medicine

## 2013-05-22 DIAGNOSIS — Z01810 Encounter for preprocedural cardiovascular examination: Secondary | ICD-10-CM

## 2013-05-22 DIAGNOSIS — R079 Chest pain, unspecified: Secondary | ICD-10-CM | POA: Insufficient documentation

## 2013-05-22 DIAGNOSIS — I447 Left bundle-branch block, unspecified: Secondary | ICD-10-CM | POA: Insufficient documentation

## 2013-05-22 DIAGNOSIS — E039 Hypothyroidism, unspecified: Secondary | ICD-10-CM | POA: Insufficient documentation

## 2013-05-22 DIAGNOSIS — I519 Heart disease, unspecified: Secondary | ICD-10-CM | POA: Insufficient documentation

## 2013-05-22 DIAGNOSIS — I359 Nonrheumatic aortic valve disorder, unspecified: Secondary | ICD-10-CM | POA: Insufficient documentation

## 2013-05-22 DIAGNOSIS — I10 Essential (primary) hypertension: Secondary | ICD-10-CM

## 2013-05-22 NOTE — Progress Notes (Signed)
Echocardiogram performed.  

## 2013-09-06 ENCOUNTER — Emergency Department (HOSPITAL_BASED_OUTPATIENT_CLINIC_OR_DEPARTMENT_OTHER): Payer: Medicaid Other

## 2013-09-06 ENCOUNTER — Encounter (HOSPITAL_BASED_OUTPATIENT_CLINIC_OR_DEPARTMENT_OTHER): Payer: Self-pay | Admitting: Emergency Medicine

## 2013-09-06 ENCOUNTER — Emergency Department (HOSPITAL_BASED_OUTPATIENT_CLINIC_OR_DEPARTMENT_OTHER)
Admission: EM | Admit: 2013-09-06 | Discharge: 2013-09-07 | Disposition: A | Discharge status: Home / Self Care | Payer: Medicaid Other | Attending: Emergency Medicine | Admitting: Emergency Medicine

## 2013-09-06 DIAGNOSIS — R5381 Other malaise: Secondary | ICD-10-CM | POA: Insufficient documentation

## 2013-09-06 DIAGNOSIS — I10 Essential (primary) hypertension: Secondary | ICD-10-CM | POA: Insufficient documentation

## 2013-09-06 DIAGNOSIS — R197 Diarrhea, unspecified: Secondary | ICD-10-CM | POA: Insufficient documentation

## 2013-09-06 DIAGNOSIS — Z9889 Other specified postprocedural states: Secondary | ICD-10-CM | POA: Insufficient documentation

## 2013-09-06 DIAGNOSIS — E039 Hypothyroidism, unspecified: Secondary | ICD-10-CM | POA: Insufficient documentation

## 2013-09-06 DIAGNOSIS — J111 Influenza due to unidentified influenza virus with other respiratory manifestations: Secondary | ICD-10-CM | POA: Insufficient documentation

## 2013-09-06 DIAGNOSIS — F3289 Other specified depressive episodes: Secondary | ICD-10-CM | POA: Insufficient documentation

## 2013-09-06 DIAGNOSIS — Z85850 Personal history of malignant neoplasm of thyroid: Secondary | ICD-10-CM | POA: Insufficient documentation

## 2013-09-06 DIAGNOSIS — F329 Major depressive disorder, single episode, unspecified: Secondary | ICD-10-CM | POA: Insufficient documentation

## 2013-09-06 DIAGNOSIS — F411 Generalized anxiety disorder: Secondary | ICD-10-CM | POA: Insufficient documentation

## 2013-09-06 DIAGNOSIS — R109 Unspecified abdominal pain: Secondary | ICD-10-CM | POA: Insufficient documentation

## 2013-09-06 DIAGNOSIS — M25519 Pain in unspecified shoulder: Secondary | ICD-10-CM | POA: Insufficient documentation

## 2013-09-06 DIAGNOSIS — Z7982 Long term (current) use of aspirin: Secondary | ICD-10-CM | POA: Insufficient documentation

## 2013-09-06 DIAGNOSIS — Z79899 Other long term (current) drug therapy: Secondary | ICD-10-CM | POA: Insufficient documentation

## 2013-09-06 DIAGNOSIS — F22 Delusional disorders: Secondary | ICD-10-CM | POA: Insufficient documentation

## 2013-09-06 DIAGNOSIS — R5383 Other fatigue: Secondary | ICD-10-CM

## 2013-09-06 LAB — RAPID URINE DRUG SCREEN, HOSP PERFORMED
Amphetamines: NOT DETECTED
Barbiturates: NOT DETECTED
Benzodiazepines: NOT DETECTED
Cocaine: NOT DETECTED
Opiates: NOT DETECTED
Tetrahydrocannabinol: POSITIVE — AB

## 2013-09-06 LAB — URINALYSIS W MICROSCOPIC + REFLEX CULTURE
Bilirubin Urine: NEGATIVE
Glucose, UA: NEGATIVE mg/dL
Ketones, ur: 15 mg/dL — AB
Leukocytes, UA: NEGATIVE
Nitrite: NEGATIVE
Protein, ur: 30 mg/dL — AB
Specific Gravity, Urine: 1.043 — ABNORMAL HIGH (ref 1.005–1.030)
Urobilinogen, UA: 0.2 mg/dL (ref 0.0–1.0)
pH: 5.5 (ref 5.0–8.0)

## 2013-09-06 LAB — COMPREHENSIVE METABOLIC PANEL
ALT: 31 U/L (ref 0–53)
AST: 35 U/L (ref 0–37)
Albumin: 3.6 g/dL (ref 3.5–5.2)
Alkaline Phosphatase: 57 U/L (ref 39–117)
BUN: 19 mg/dL (ref 6–23)
CO2: 23 mEq/L (ref 19–32)
Calcium: 9.2 mg/dL (ref 8.4–10.5)
Chloride: 98 mEq/L (ref 96–112)
Creatinine, Ser: 0.9 mg/dL (ref 0.50–1.35)
GFR calc Af Amer: >90 mL/min (ref >90)
GFR calc non Af Amer: >90 mL/min (ref >90)
Glucose, Bld: 163 mg/dL — ABNORMAL HIGH (ref 70–99)
Potassium: 3.6 mEq/L — ABNORMAL LOW (ref 3.7–5.3)
Sodium: 136 mEq/L — ABNORMAL LOW (ref 137–147)
Total Bilirubin: 0.7 mg/dL (ref 0.3–1.2)
Total Protein: 7.3 g/dL (ref 6.0–8.3)

## 2013-09-06 LAB — CBC WITH DIFFERENTIAL/PLATELET
Basophils Absolute: 0 10*3/uL (ref 0.0–0.1)
Basophils Relative: 0 % (ref 0–1)
Eosinophils Absolute: 0 10*3/uL (ref 0.0–0.7)
Eosinophils Relative: 0 % (ref 0–5)
HCT: 41.3 % (ref 39.0–52.0)
Hemoglobin: 14.2 g/dL (ref 13.0–17.0)
Lymphocytes Relative: 2 % — ABNORMAL LOW (ref 12–46)
Lymphs Abs: 0.3 10*3/uL — ABNORMAL LOW (ref 0.7–4.0)
MCH: 30.4 pg (ref 26.0–34.0)
MCHC: 34.4 g/dL (ref 30.0–36.0)
MCV: 88.4 fL (ref 78.0–100.0)
Monocytes Absolute: 1.3 10*3/uL — ABNORMAL HIGH (ref 0.1–1.0)
Monocytes Relative: 8 % (ref 3–12)
Neutro Abs: 15.4 10*3/uL — ABNORMAL HIGH (ref 1.7–7.7)
Neutrophils Relative %: 91 % — ABNORMAL HIGH (ref 43–77)
Platelets: 100 10*3/uL — ABNORMAL LOW (ref 150–400)
RBC: 4.67 MIL/uL (ref 4.22–5.81)
RDW: 13.8 % (ref 11.5–15.5)
WBC: 17 10*3/uL — ABNORMAL HIGH (ref 4.0–10.5)

## 2013-09-06 LAB — ETHANOL: Alcohol, Ethyl (B): <11 mg/dL (ref 0–11)

## 2013-09-06 LAB — PROTIME-INR
INR: 1.1 (ref 0.00–1.49)
Prothrombin Time: 14 seconds (ref 11.6–15.2)

## 2013-09-06 LAB — LIPASE, BLOOD: Lipase: 18 U/L (ref 11–59)

## 2013-09-06 LAB — AMMONIA: Ammonia: 31 umol/L (ref 11–60)

## 2013-09-06 MED ORDER — OSELTAMIVIR PHOSPHATE 75 MG PO CAPS
75.0000 mg | ORAL_CAPSULE | Freq: Once | ORAL | Status: AC
  Administered 2013-09-06: 75 mg via ORAL
  Filled 2013-09-06: qty 1

## 2013-09-06 MED ORDER — OSELTAMIVIR PHOSPHATE 75 MG PO CAPS: 75.0000 mg | ORAL_CAPSULE | Freq: Two times a day (BID) | ORAL | Status: DC

## 2013-09-06 MED ORDER — ONDANSETRON 4 MG PO TBDP: ORAL_TABLET | ORAL | Status: DC

## 2013-09-06 MED ORDER — HYDROMORPHONE HCL PF 1 MG/ML IJ SOLN
0.5000 mg | INTRAMUSCULAR | Status: AC
  Administered 2013-09-06: 0.5 mg via INTRAVENOUS
  Filled 2013-09-06: qty 1

## 2013-09-06 MED ORDER — IOHEXOL 300 MG/ML  SOLN
50.0000 mL | Freq: Once | INTRAMUSCULAR | Status: AC | PRN
  Administered 2013-09-06: 50 mL via ORAL

## 2013-09-06 MED ORDER — ACETAMINOPHEN 325 MG PO TABS
650.0000 mg | ORAL_TABLET | Freq: Once | ORAL | Status: AC
  Administered 2013-09-06: 650 mg via ORAL
  Filled 2013-09-06: qty 2

## 2013-09-06 MED ORDER — SODIUM CHLORIDE 0.9 % IV SOLN
Freq: Once | INTRAVENOUS | Status: AC
  Administered 2013-09-06: 1000 mL via INTRAVENOUS

## 2013-09-06 MED ORDER — SODIUM CHLORIDE 0.9 % IV BOLUS (SEPSIS)
1000.0000 mL | INTRAVENOUS | Status: AC
  Administered 2013-09-06: 1000 mL via INTRAVENOUS

## 2013-09-06 MED ORDER — IOHEXOL 300 MG/ML  SOLN
100.0000 mL | Freq: Once | INTRAMUSCULAR | Status: AC | PRN
  Administered 2013-09-06: 100 mL via INTRAVENOUS

## 2013-09-06 MED ORDER — ONDANSETRON HCL 4 MG/2ML IJ SOLN
4.0000 mg | Freq: Once | INTRAMUSCULAR | Status: AC
  Administered 2013-09-06: 4 mg via INTRAVENOUS
  Filled 2013-09-06: qty 2

## 2013-09-06 NOTE — ED Provider Notes (Addendum)
CSN: [id number]     Arrival date & time 09/06/13  1847 History  This chart was scribed for Blanchard Kelch, MD by Mercy Moore, ED scribe.  This patient was seen in room MH11/MH11 and the patient's care was started at 7:48 PM.   Chief Complaint  Patient presents with  . Flu Like Symptoms    Patient is a 58 y.o. male presenting with vomiting and abdominal pain. The history is provided by the patient. No language interpreter was used.  Emesis Severity:  Moderate Duration:  1 day Chronicity:  New Recent urination:  Normal Relieved by:  Nothing Worsened by:  Nothing tried Ineffective treatments:  None tried Associated symptoms: abdominal pain, cough, diarrhea and fever   Associated symptoms: no headaches   Abdominal Pain Pain quality: aching and bloating   Pain radiates to:  Does not radiate Pain severity:  Moderate Onset quality:  Sudden Duration:  1 day Associated symptoms: cough, diarrhea, fever, nausea and vomiting   Associated symptoms: no chest pain, no constipation, no dysuria, no fatigue and no shortness of breath   Risk factors: aspirin    HPI Comments: Bradley [name] is a 58 y.o. male who presents to the Emergency Department complaining of vomiting and diarrhea, and low grade fever onset last night. Family member reports that patient vomited all through the night. Maximum temperature at home recorded at 101F. Patient reports abdominal pain and bloating. Patient reports mild cough. Denies any weakness or numbness. Denies headache. Denies chest pain or SOB.  Patient had a mechanical fall from standing a few weeks ago and has resultant bruise to upper left shoulder. He denies hitting his head at that time or loss of consciousness. Patient is scheduled to have surgery of this shoulder.   He states he has some mild confusion which began today. The family has noticed this today as well.   On anticoagulants: Aspirin.   Past Medical History  Diagnosis Date  . Anxiety   .  Depression   . History of thyroid cancer   . Hypertension   . Hypothyroidism, postsurgical   . Aortic stenosis     s/p Bentall with bioprosthetic AVR 02/2010; Last echo (9/11): Moderate LVH, EF 45-50%, AVR functioning appropriately, aortic valve mean gradient 21, diastolic dysfunction. Chest MRA (2/13): Mild to moderate dilatation at the sinus of Valsalva at 4.1 cm, mild dilatation ascending aorta distal to the tube graft at 3.9 cm, moderate dilatation of the innominate artery a 2.1 cm;    . Hx of cardiac cath     a. LHC in 02/2010: normal cors  . Hx of echocardiogram 2014    Echo (10/14): Severe LVH, EF 60-65%, normal wall motion, grade 1 diastolic dysfunction, AVR functioning normally, mild aortic stenosis (mean 19), moderately dilated aorta, mild LAE, mild RVE   Past Surgical History  Procedure Laterality Date  . Cardiac catheterization  03/02/2010    NORMAL CORONARY ARTERY  . Sternotomy      REDO  . Transthoracic echocardiogram  03/2010    SHOWED MILD REDUCTION OF LV FUNCTION  . Thyroidectomy    . Rotator cuff repair  2012    Right   Family History  Problem Relation Age of Onset  . Heart disease Father    History  Substance Use Topics  . Smoking status: Never Smoker   . Smokeless tobacco: Never Used  . Alcohol Use: No    Review of Systems  Constitutional: Positive for fever. Negative for activity change, appetite  change and fatigue.  HENT: Negative for congestion, facial swelling, rhinorrhea and trouble swallowing.   Eyes: Negative for photophobia and pain.  Respiratory: Positive for cough. Negative for chest tightness and shortness of breath.   Cardiovascular: Negative for chest pain and leg swelling.  Gastrointestinal: Positive for nausea, vomiting, abdominal pain and diarrhea. Negative for constipation.  Endocrine: Negative for polydipsia and polyuria.  Genitourinary: Negative for dysuria, urgency, decreased urine volume and difficulty urinating.  Musculoskeletal:  Negative for back pain and gait problem.       Fatigue Left shoulder pain   Skin: Negative for color change, rash and wound.  Allergic/Immunologic: Negative for immunocompromised state.  Neurological: Negative for dizziness, facial asymmetry, speech difficulty, weakness, numbness and headaches.  Psychiatric/Behavioral: Positive for confusion. Negative for decreased concentration and agitation.  All other systems reviewed and are negative.    Allergies  Review of patient's allergies indicates no known allergies.  Home Medications   Current Outpatient Rx  Name  Route  Sig  Dispense  Refill  . aspirin 325 MG tablet   Oral   Take 325 mg by mouth daily.           Marland Kitchen levothyroxine (SYNTHROID, LEVOTHROID) 175 MCG tablet      TAKE ONE TABLET BY MOUTH DAILY   90 tablet   3   . lisinopril (PRINIVIL,ZESTRIL) 10 MG tablet   Oral   Take 1 tablet (10 mg total) by mouth daily.   30 tablet   11   . metoprolol tartrate (LOPRESSOR) 25 MG tablet      TAKE ONE-HALF TABLET BY MOUTH TWICE DAILY   30 tablet   11   . multivitamin (THERAGRAN) per tablet   Oral   Take 1 tablet by mouth daily.           . sertraline (ZOLOFT) 50 MG tablet      TAKE ONE TABLET BY MOUTH EVERY DAY   30 tablet   5    BP 126/80  Pulse 96  Temp(Src) 99.7 F (37.6 C) (Oral)  Resp 22  Ht 6\' 1"  (1.854 m)  Wt 200 lb (90.719 kg)  BMI 26.39 kg/m2  SpO2 98% Physical Exam  Nursing note and vitals reviewed. Constitutional: He appears well-developed and well-nourished. No distress.  HENT:  Head: Normocephalic and atraumatic.  Mouth/Throat: Oropharynx is clear and moist. No oropharyngeal exudate.  Eyes: Pupils are equal, round, and reactive to light.  Neck: Normal range of motion. Neck supple.  Cardiovascular: Normal rate and regular rhythm.  Exam reveals no gallop and no friction rub.   Murmur heard. Pulmonary/Chest: Effort normal and breath sounds normal. No respiratory distress. He has no wheezes. He  has no rales.  Abdominal: Soft. Bowel sounds are normal. He exhibits distension. He exhibits no mass. There is no tenderness. There is no rebound and no guarding.  Mild distention. No focal tenderness to palpation of abdomen.  Musculoskeletal: Normal range of motion. He exhibits no edema and no tenderness.  Several mild old appearing abrasions to left anterior shoulder with mild tenderness to palpation of this area.   Neurological: He is alert.  Alert and oriented x2. Does not know year. speech: normal in context and clarity memory: intact grossly cranial nerves II-XII: intact motor strength: full proximally and distally no involuntary movements or tremors sensation: intact to light touch diffusely  cerebellar: finger-to-nose and heel-to-shin intact gait: normal forwards and backwards, normal tandem gait   Skin: Skin is warm and dry.  Psychiatric:  He has a normal mood and affect.    ED Course  Procedures (including critical care time) DIAGNOSTIC STUDIES: Oxygen Saturation is 98% on room air, normal by my interpretation.    COORDINATION OF CARE:  7:58 PM- Pt advised of plan for treatment and pt agrees.   Labs Review Labs Reviewed  CBC WITH DIFFERENTIAL - Abnormal; Notable for the following:    WBC 17.0 (*)    Platelets 100 (*)    Neutrophils Relative % 91 (*)    Neutro Abs 15.4 (*)    Lymphocytes Relative 2 (*)    Lymphs Abs 0.3 (*)    Monocytes Absolute 1.3 (*)    All other components within normal limits  COMPREHENSIVE METABOLIC PANEL - Abnormal; Notable for the following:    Sodium 136 (*)    Potassium 3.6 (*)    Glucose, Bld 163 (*)    All other components within normal limits  URINALYSIS W MICROSCOPIC + REFLEX CULTURE - Abnormal; Notable for the following:    Color, Urine AMBER (*)    APPearance CLOUDY (*)    Specific Gravity, Urine 1.043 (*)    Hgb urine dipstick SMALL (*)    Ketones, ur 15 (*)    Protein, ur 30 (*)    Bacteria, UA FEW (*)    Squamous  Epithelial / LPF FEW (*)    All other components within normal limits  URINE RAPID DRUG SCREEN (HOSP PERFORMED) - Abnormal; Notable for the following:    Tetrahydrocannabinol POSITIVE (*)    All other components within normal limits  LIPASE, BLOOD  AMMONIA  ETHANOL  PROTIME-INR   Imaging Review Dg Chest 2 View  09/06/2013   CLINICAL DATA:  Fever, nausea, vomiting.  EXAM: CHEST  2 VIEW  COMPARISON:  08/17/2011  FINDINGS: Prior median sternotomy and valve replacement. Mild cardiomegaly and vascular congestion. Low lung volumes with bibasilar atelectasis. No effusions. No acute bony abnormality.  IMPRESSION: Cardiomegaly, bibasilar atelectasis.   Electronically Signed   By: Rolm Baptise M.D.   On: 09/06/2013 19:34    EKG Interpretation    Date/Time:  Saturday September 06 2013 20:18:05 EST Ventricular Rate:  83 PR Interval:  166 QRS Duration: 126 QT Interval:  374 QTC Calculation: 439 R Axis:   34 Text Interpretation:  Normal sinus rhythm Possible Left atrial enlargement Non-specific intra-ventricular conduction block No significant change since last tracing Confirmed by Retia Cordle  MD, Jaedan Huttner ([id number]) on 09/06/2013 8:36:37 PM            MDM   1. Influenza    58 y.o. M here w/ vomiting, diarrhea, abd pain, bodyaches, fever, and mild confusion. Pt is a/ox2, does not know year or his age. Family states this is unusual for him. Otherwise he has a normal neurological exam. I suspect his mild confusion is related to fever and upon checking a rectal temp he was found to be febrile to 102. Likely a component of mild dehydration as well. VS otherwise unremarkable. Will get screening imaging including CT head/abd and labs.    Labs show elev WBC, cannabinoids on UDS, old lacunar infarct on CT head. I spoke w/ the radiologist about the likely venous anomaly seen on head CT. There has been no acute trauma and pt denies hitting his head w/ the fall several weeks ago and has an abrasion to his left  shoulder where he landed. Doubt that this is an ICH. Pts MS has cleared w/ tx of fever w some very  mild confusion remaining. I discussed admission w/ the family, but they would prefer to go home. I suspect he has influenza and the mild confusion is related to his fever. Unlikely that he has concurrent CVA given normal neuro exam otherwise. Will tx w/ tamiflu and zofran. I gave strong return precautions to the family. The pt remains ambulatory at d/c and states he is feeling much better.  I have discussed the diagnosis/risks/treatment options with the patient and family and believe the pt to be eligible for discharge home to follow-up with his pcp in 1-2 days. We also discussed returning to the ED immediately if new or worsening sx occur. We discussed the sx which are most concerning (e.g., worsening confusion, intractable fever, continued abd pain, inability to tolerate po, weakness, numbness) that necessitate immediate return. Medications administered to the patient during their visit and any new prescriptions provided to the patient are listed below.  Medications given during this visit Medications  HYDROmorphone (DILAUDID) injection 0.5 mg (0.5 mg Intravenous Given 09/06/13 2047)  sodium chloride 0.9 % bolus 1,000 mL (0 mLs Intravenous Stopped 09/07/13 0020)  iohexol (OMNIPAQUE) 300 MG/ML solution 50 mL (50 mLs Oral Contrast Given 09/06/13 2100)  iohexol (OMNIPAQUE) 300 MG/ML solution 100 mL (100 mLs Intravenous Contrast Given 09/06/13 2204)  acetaminophen (TYLENOL) tablet 650 mg (650 mg Oral Given 09/06/13 2042)  ondansetron (ZOFRAN) injection 4 mg (4 mg Intravenous Given 09/06/13 2129)  0.9 %  sodium chloride infusion ( Intravenous Stopped 09/07/13 0020)  oseltamivir (TAMIFLU) capsule 75 mg (75 mg Oral Given 09/06/13 2353)    Discharge Medication List as of 09/06/2013 11:16 PM    START taking these medications   Details  ondansetron (ZOFRAN ODT) 4 MG disintegrating tablet 4mg  ODT q4 hours prn nausea/vomit,  Print    oseltamivir (TAMIFLU) 75 MG capsule Take 1 capsule (75 mg total) by mouth every 12 (twelve) hours., Starting 09/06/2013, Until Discontinued, Print          I personally performed the services described in this documentation, which was scribed in my presence. The recorded information has been reviewed and is accurate.    Blanchard Kelch, MD 09/07/13 [place], MD 09/07/13 [phone]

## 2013-09-06 NOTE — ED Notes (Signed)
Onset of fever, nausea, vomiting last night.  Also c/o body aches.

## 2013-09-08 ENCOUNTER — Encounter (HOSPITAL_COMMUNITY): Payer: Self-pay | Admitting: Emergency Medicine

## 2013-09-08 ENCOUNTER — Inpatient Hospital Stay (HOSPITAL_COMMUNITY)
Admission: EM | Admit: 2013-09-08 | Discharge: 2013-10-10 | DRG: 264 | Disposition: A | Discharge status: Home Health | Payer: Medicaid Other | Attending: Internal Medicine | Admitting: Internal Medicine

## 2013-09-08 ENCOUNTER — Emergency Department (HOSPITAL_COMMUNITY): Payer: Medicaid Other

## 2013-09-08 DIAGNOSIS — A419 Sepsis, unspecified organism: Secondary | ICD-10-CM

## 2013-09-08 DIAGNOSIS — N179 Acute kidney failure, unspecified: Secondary | ICD-10-CM

## 2013-09-08 DIAGNOSIS — R451 Restlessness and agitation: Secondary | ICD-10-CM | POA: Diagnosis present

## 2013-09-08 DIAGNOSIS — J189 Pneumonia, unspecified organism: Secondary | ICD-10-CM | POA: Diagnosis present

## 2013-09-08 DIAGNOSIS — F32A Depression, unspecified: Secondary | ICD-10-CM

## 2013-09-08 DIAGNOSIS — R7881 Bacteremia: Secondary | ICD-10-CM

## 2013-09-08 DIAGNOSIS — G92 Toxic encephalopathy: Secondary | ICD-10-CM | POA: Diagnosis present

## 2013-09-08 DIAGNOSIS — J1100 Influenza due to unidentified influenza virus with unspecified type of pneumonia: Secondary | ICD-10-CM | POA: Diagnosis present

## 2013-09-08 DIAGNOSIS — I38 Endocarditis, valve unspecified: Secondary | ICD-10-CM

## 2013-09-08 DIAGNOSIS — E039 Hypothyroidism, unspecified: Secondary | ICD-10-CM | POA: Diagnosis present

## 2013-09-08 DIAGNOSIS — G929 Unspecified toxic encephalopathy: Secondary | ICD-10-CM | POA: Diagnosis present

## 2013-09-08 DIAGNOSIS — I509 Heart failure, unspecified: Secondary | ICD-10-CM | POA: Diagnosis present

## 2013-09-08 DIAGNOSIS — Z8249 Family history of ischemic heart disease and other diseases of the circulatory system: Secondary | ICD-10-CM

## 2013-09-08 DIAGNOSIS — B9561 Methicillin susceptible Staphylococcus aureus infection as the cause of diseases classified elsewhere: Secondary | ICD-10-CM

## 2013-09-08 DIAGNOSIS — Z7982 Long term (current) use of aspirin: Secondary | ICD-10-CM

## 2013-09-08 DIAGNOSIS — F419 Anxiety disorder, unspecified: Secondary | ICD-10-CM

## 2013-09-08 DIAGNOSIS — I120 Hypertensive chronic kidney disease with stage 5 chronic kidney disease or end stage renal disease: Secondary | ICD-10-CM | POA: Diagnosis present

## 2013-09-08 DIAGNOSIS — I10 Essential (primary) hypertension: Secondary | ICD-10-CM

## 2013-09-08 DIAGNOSIS — I498 Other specified cardiac arrhythmias: Secondary | ICD-10-CM | POA: Diagnosis not present

## 2013-09-08 DIAGNOSIS — T826XXA Infection and inflammatory reaction due to cardiac valve prosthesis, initial encounter: Secondary | ICD-10-CM

## 2013-09-08 DIAGNOSIS — Z85850 Personal history of malignant neoplasm of thyroid: Secondary | ICD-10-CM

## 2013-09-08 DIAGNOSIS — I2690 Septic pulmonary embolism without acute cor pulmonale: Secondary | ICD-10-CM | POA: Diagnosis present

## 2013-09-08 DIAGNOSIS — R21 Rash and other nonspecific skin eruption: Secondary | ICD-10-CM | POA: Diagnosis present

## 2013-09-08 DIAGNOSIS — R4182 Altered mental status, unspecified: Secondary | ICD-10-CM | POA: Diagnosis present

## 2013-09-08 DIAGNOSIS — I5041 Acute combined systolic (congestive) and diastolic (congestive) heart failure: Secondary | ICD-10-CM | POA: Diagnosis not present

## 2013-09-08 DIAGNOSIS — N186 End stage renal disease: Secondary | ICD-10-CM | POA: Diagnosis present

## 2013-09-08 DIAGNOSIS — Y831 Surgical operation with implant of artificial internal device as the cause of abnormal reaction of the patient, or of later complication, without mention of misadventure at the time of the procedure: Secondary | ICD-10-CM | POA: Diagnosis present

## 2013-09-08 DIAGNOSIS — E874 Mixed disorder of acid-base balance: Secondary | ICD-10-CM | POA: Diagnosis present

## 2013-09-08 DIAGNOSIS — R6520 Severe sepsis without septic shock: Secondary | ICD-10-CM

## 2013-09-08 DIAGNOSIS — T827XXA Infection and inflammatory reaction due to other cardiac and vascular devices, implants and grafts, initial encounter: Principal | ICD-10-CM | POA: Diagnosis present

## 2013-09-08 DIAGNOSIS — I5031 Acute diastolic (congestive) heart failure: Secondary | ICD-10-CM

## 2013-09-08 DIAGNOSIS — E876 Hypokalemia: Secondary | ICD-10-CM

## 2013-09-08 DIAGNOSIS — I214 Non-ST elevation (NSTEMI) myocardial infarction: Secondary | ICD-10-CM

## 2013-09-08 DIAGNOSIS — E873 Alkalosis: Secondary | ICD-10-CM

## 2013-09-08 DIAGNOSIS — G9340 Encephalopathy, unspecified: Secondary | ICD-10-CM | POA: Diagnosis present

## 2013-09-08 DIAGNOSIS — F3289 Other specified depressive episodes: Secondary | ICD-10-CM

## 2013-09-08 DIAGNOSIS — G0490 Encephalitis and encephalomyelitis, unspecified: Secondary | ICD-10-CM

## 2013-09-08 DIAGNOSIS — D693 Immune thrombocytopenic purpura: Secondary | ICD-10-CM

## 2013-09-08 DIAGNOSIS — E870 Hyperosmolality and hypernatremia: Secondary | ICD-10-CM | POA: Diagnosis not present

## 2013-09-08 DIAGNOSIS — F411 Generalized anxiety disorder: Secondary | ICD-10-CM

## 2013-09-08 DIAGNOSIS — I76 Septic arterial embolism: Secondary | ICD-10-CM

## 2013-09-08 DIAGNOSIS — D6959 Other secondary thrombocytopenia: Secondary | ICD-10-CM | POA: Diagnosis not present

## 2013-09-08 DIAGNOSIS — I213 ST elevation (STEMI) myocardial infarction of unspecified site: Secondary | ICD-10-CM | POA: Diagnosis not present

## 2013-09-08 DIAGNOSIS — N170 Acute kidney failure with tubular necrosis: Secondary | ICD-10-CM | POA: Diagnosis not present

## 2013-09-08 DIAGNOSIS — F1210 Cannabis abuse, uncomplicated: Secondary | ICD-10-CM | POA: Diagnosis present

## 2013-09-08 DIAGNOSIS — D65 Disseminated intravascular coagulation [defibrination syndrome]: Secondary | ICD-10-CM | POA: Diagnosis present

## 2013-09-08 DIAGNOSIS — G4700 Insomnia, unspecified: Secondary | ICD-10-CM | POA: Diagnosis not present

## 2013-09-08 DIAGNOSIS — I447 Left bundle-branch block, unspecified: Secondary | ICD-10-CM

## 2013-09-08 DIAGNOSIS — N390 Urinary tract infection, site not specified: Secondary | ICD-10-CM

## 2013-09-08 DIAGNOSIS — F319 Bipolar disorder, unspecified: Secondary | ICD-10-CM | POA: Diagnosis present

## 2013-09-08 DIAGNOSIS — Z952 Presence of prosthetic heart valve: Secondary | ICD-10-CM

## 2013-09-08 DIAGNOSIS — E46 Unspecified protein-calorie malnutrition: Secondary | ICD-10-CM | POA: Diagnosis not present

## 2013-09-08 DIAGNOSIS — I5043 Acute on chronic combined systolic (congestive) and diastolic (congestive) heart failure: Secondary | ICD-10-CM

## 2013-09-08 DIAGNOSIS — E890 Postprocedural hypothyroidism: Secondary | ICD-10-CM

## 2013-09-08 DIAGNOSIS — G0491 Myelitis, unspecified: Secondary | ICD-10-CM

## 2013-09-08 DIAGNOSIS — A4101 Sepsis due to Methicillin susceptible Staphylococcus aureus: Secondary | ICD-10-CM

## 2013-09-08 DIAGNOSIS — E871 Hypo-osmolality and hyponatremia: Secondary | ICD-10-CM | POA: Diagnosis not present

## 2013-09-08 DIAGNOSIS — I252 Old myocardial infarction: Secondary | ICD-10-CM

## 2013-09-08 DIAGNOSIS — F333 Major depressive disorder, recurrent, severe with psychotic symptoms: Secondary | ICD-10-CM | POA: Diagnosis present

## 2013-09-08 DIAGNOSIS — F329 Major depressive disorder, single episode, unspecified: Secondary | ICD-10-CM

## 2013-09-08 DIAGNOSIS — Z79899 Other long term (current) drug therapy: Secondary | ICD-10-CM

## 2013-09-08 DIAGNOSIS — D649 Anemia, unspecified: Secondary | ICD-10-CM | POA: Diagnosis not present

## 2013-09-08 DIAGNOSIS — G8190 Hemiplegia, unspecified affecting unspecified side: Secondary | ICD-10-CM | POA: Diagnosis not present

## 2013-09-08 LAB — COMPREHENSIVE METABOLIC PANEL
ALT: 40 U/L (ref 0–53)
AST: 44 U/L — ABNORMAL HIGH (ref 0–37)
Albumin: 3 g/dL — ABNORMAL LOW (ref 3.5–5.2)
Alkaline Phosphatase: 87 U/L (ref 39–117)
BUN: 18 mg/dL (ref 6–23)
CO2: 22 mEq/L (ref 19–32)
Calcium: 8.8 mg/dL (ref 8.4–10.5)
Chloride: 92 mEq/L — ABNORMAL LOW (ref 96–112)
Creatinine, Ser: 0.76 mg/dL (ref 0.50–1.35)
GFR calc Af Amer: >90 mL/min (ref >90)
GFR calc non Af Amer: >90 mL/min (ref >90)
Glucose, Bld: 258 mg/dL — ABNORMAL HIGH (ref 70–99)
Potassium: 3.1 mEq/L — ABNORMAL LOW (ref 3.7–5.3)
Sodium: 130 mEq/L — ABNORMAL LOW (ref 137–147)
Total Bilirubin: 0.7 mg/dL (ref 0.3–1.2)
Total Protein: 7 g/dL (ref 6.0–8.3)

## 2013-09-08 LAB — URINE MICROSCOPIC-ADD ON

## 2013-09-08 LAB — URINALYSIS, ROUTINE W REFLEX MICROSCOPIC
Bilirubin Urine: NEGATIVE
Glucose, UA: 100 mg/dL — AB
Ketones, ur: NEGATIVE mg/dL
Nitrite: NEGATIVE
Protein, ur: 100 mg/dL — AB
Specific Gravity, Urine: 1.021 (ref 1.005–1.030)
Urobilinogen, UA: 1 mg/dL (ref 0.0–1.0)
pH: 6 (ref 5.0–8.0)

## 2013-09-08 LAB — CG4 I-STAT (LACTIC ACID): Lactic Acid, Venous: 4.16 mmol/L — ABNORMAL HIGH (ref 0.5–2.2)

## 2013-09-08 LAB — CBC
HCT: 44.1 % (ref 39.0–52.0)
Hemoglobin: 15.6 g/dL (ref 13.0–17.0)
MCH: 30.5 pg (ref 26.0–34.0)
MCHC: 35.4 g/dL (ref 30.0–36.0)
MCV: 86.1 fL (ref 78.0–100.0)
Platelets: 39 10*3/uL — ABNORMAL LOW (ref 150–400)
RBC: 5.12 MIL/uL (ref 4.22–5.81)
RDW: 14 % (ref 11.5–15.5)
WBC: 17.9 10*3/uL — ABNORMAL HIGH (ref 4.0–10.5)

## 2013-09-08 MED ORDER — SODIUM CHLORIDE 0.9 % IV BOLUS (SEPSIS)
1000.0000 mL | Freq: Once | INTRAVENOUS | Status: AC
  Administered 2013-09-09: 1000 mL via INTRAVENOUS

## 2013-09-08 MED ORDER — SODIUM CHLORIDE 0.9 % IV BOLUS (SEPSIS)
1000.0000 mL | Freq: Once | INTRAVENOUS | Status: AC
  Administered 2013-09-08: 1000 mL via INTRAVENOUS

## 2013-09-08 MED ORDER — VANCOMYCIN HCL IN DEXTROSE 1-5 GM/200ML-% IV SOLN
1000.0000 mg | Freq: Once | INTRAVENOUS | Status: AC
  Administered 2013-09-09: 1000 mg via INTRAVENOUS
  Filled 2013-09-08: qty 200

## 2013-09-08 MED ORDER — PIPERACILLIN-TAZOBACTAM 3.375 G IVPB
3.3750 g | Freq: Once | INTRAVENOUS | Status: AC
  Administered 2013-09-08: 3.375 g via INTRAVENOUS
  Filled 2013-09-08: qty 50

## 2013-09-08 NOTE — ED Notes (Addendum)
MD [name] Heck and RN Ledwell made aware of the pts critical lactic acid result.

## 2013-09-08 NOTE — ED Notes (Signed)
Per pt's wife and son, pt has had AMS since Friday, was evaluated at [facility] and diagnosed with the flu. Pt has remained altered since that time. Pt currently disoriented x4, reports generalized body aches. Pt's family states pt has been incontinent of urine and stool at times.

## 2013-09-08 NOTE — ED Provider Notes (Signed)
CSN: [id number]     Arrival date & time 09/08/13  2005 History   First MD Initiated Contact with Patient 09/08/13 2258     Chief Complaint  Patient presents with  . Altered Mental Status     (Consider location/radiation/quality/duration/timing/severity/associated sxs/prior Treatment) HPI Comments: 58 year old male, recently seen in the emergency department, diagnosed with possible influenza-like illness who presents with worsening altered mental status. The patient does have a history of hypertension and aortic stenosis status post prosthetic valve replacement in 2011. The family states that since he was seen in the emergency department for a fever of over 102 he has had worsening in his "delirium". The family states that this entails the inability to walk because of loss of balance as well as not knowing where he is going. He is unable to speak clearly, he is unable to answer questions appropriately giving answers that are bizarre and not appropriate for the situation. He was found urinating in the hallway at 1 point and when placed in front of a toilet he had no idea what to do with it.  The family states that he has not had any seizures, he has not been vomiting but he has been drinking fluids, no other oral intake. He has started on Tamiflu  Patient is a 58 y.o. male presenting with altered mental status. The history is provided by a relative and medical records.  Altered Mental Status   Past Medical History  Diagnosis Date  . Anxiety   . Depression   . History of thyroid cancer   . Hypertension   . Hypothyroidism, postsurgical   . Aortic stenosis     s/p [place] with bioprosthetic AVR 02/2010; Last echo (9/11): Moderate LVH, EF 45-50%, AVR functioning appropriately, aortic valve mean gradient 21, diastolic dysfunction. Chest MRA (2/13): Mild to moderate dilatation at the sinus of Valsalva at 4.1 cm, mild dilatation ascending aorta distal to the tube graft at 3.9 cm, moderate dilatation  of the innominate artery a 2.1 cm;    . Hx of cardiac cath     a. LHC in 02/2010: normal cors  . Hx of echocardiogram 2014    Echo (10/14): Severe LVH, EF 60-65%, normal wall motion, grade 1 diastolic dysfunction, AVR functioning normally, mild aortic stenosis (mean 19), moderately dilated aorta, mild LAE, mild RVE   Past Surgical History  Procedure Laterality Date  . Cardiac catheterization  03/02/2010    NORMAL CORONARY ARTERY  . Sternotomy      REDO  . Transthoracic echocardiogram  03/2010    SHOWED MILD REDUCTION OF LV FUNCTION  . Thyroidectomy    . Rotator cuff repair  2012    Right   Family History  Problem Relation Age of Onset  . Heart disease Father    History  Substance Use Topics  . Smoking status: Never Smoker   . Smokeless tobacco: Never Used  . Alcohol Use: No    Review of Systems  Unable to perform ROS: Mental status change      Allergies  Review of patient's allergies indicates no known allergies.  Home Medications   No current outpatient prescriptions on file. BP 149/101  Pulse 128  Temp(Src) 102.2 F (39 C) (Rectal)  Resp 28  Ht 6\' 1"  (1.854 m)  Wt 200 lb (90.719 kg)  BMI 26.39 kg/m2  SpO2 97% Physical Exam  Nursing note and vitals reviewed. Constitutional: He appears well-developed and well-nourished.  Tachycardic, uncomfortable appearing  HENT:  Head: Normocephalic and atraumatic.  Mouth/Throat: No oropharyngeal exudate.  Dry mucous membranes  Eyes: Conjunctivae and EOM are normal. Pupils are equal, round, and reactive to light. Right eye exhibits no discharge. Left eye exhibits no discharge. No scleral icterus.  Neck: Normal range of motion. Neck supple. No JVD present. No thyromegaly present.  Cardiovascular: Regular rhythm and intact distal pulses.  Exam reveals no gallop and no friction rub.   Murmur heard. Sinus tachycardia, strong pulses at the radial arteries and dorsalis pedis bilaterally, normal capillary refill  Pulmonary/Chest:  He is in respiratory distress. He has no wheezes. He has rales.  Slight increased work of breathing, tachypnea, oxygen of 93% on room air, rales at the right base, no accessory muscle use  Abdominal: Soft. Bowel sounds are normal. He exhibits distension. He exhibits no mass. There is no tenderness.  Mild distention with diffuse tympanitic sounds to percussion, no tenderness, no guarding  Musculoskeletal: Normal range of motion. He exhibits no edema and no tenderness.  Lymphadenopathy:    He has no cervical adenopathy.  Neurological: He is alert.  The patient needs assistance sitting up in the bed, his strength appears normal when tested individually by extremity, he is able to tell me his date and month of birth but unaware of the year, does not know where he was born, does not know the color of his daughters shirt, cannot answer questions appropriately. No difficulty with coordination  Skin: Skin is warm and dry. No rash noted. No erythema.  No petechiae or purpura  Psychiatric: He has a normal mood and affect. His behavior is normal.    ED Course  Procedures (including critical care time) Labs Review Labs Reviewed  CBC - Abnormal; Notable for the following:    WBC 17.9 (*)    Platelets 39 (*)    All other components within normal limits  COMPREHENSIVE METABOLIC PANEL - Abnormal; Notable for the following:    Sodium 130 (*)    Potassium 3.1 (*)    Chloride 92 (*)    Glucose, Bld 258 (*)    Albumin 3.0 (*)    AST 44 (*)    All other components within normal limits  URINALYSIS, ROUTINE W REFLEX MICROSCOPIC - Abnormal; Notable for the following:    APPearance CLOUDY (*)    Glucose, UA 100 (*)    Hgb urine dipstick LARGE (*)    Protein, ur 100 (*)    Leukocytes, UA SMALL (*)    All other components within normal limits  URINE MICROSCOPIC-ADD ON - Abnormal; Notable for the following:    Bacteria, UA MANY (*)    Casts GRANULAR CAST (*)    All other components within normal limits   COMPREHENSIVE METABOLIC PANEL - Abnormal; Notable for the following:    Sodium 134 (*)    Potassium 3.0 (*)    Glucose, Bld 184 (*)    Calcium 8.0 (*)    Total Protein 5.7 (*)    Albumin 2.5 (*)    AST 48 (*)    All other components within normal limits  CBC - Abnormal; Notable for the following:    WBC 18.2 (*)    MCHC 36.4 (*)    Platelets 30 (*)    All other components within normal limits  CG4 I-STAT (LACTIC ACID) - Abnormal; Notable for the following:    Lactic Acid, Venous 4.16 (*)    All other components within normal limits  MRSA PCR SCREENING  URINE CULTURE  CULTURE, BLOOD (ROUTINE X  2)  CULTURE, BLOOD (ROUTINE X 2)  AMMONIA  PROTIME-INR  TSH   Imaging Review Ct Head Wo Contrast  09/09/2013   CLINICAL DATA:  Fever.  Altered mental status.  EXAM: CT HEAD WITHOUT CONTRAST  TECHNIQUE: Contiguous axial images were obtained from the base of the skull through the vertex without intravenous contrast.  COMPARISON:  CT HEAD W/O CM dated 09/06/2013; DG CHEST 1V PORT dated 09/08/2013; CT ABD/PELVIS W CM dated 09/06/2013  FINDINGS: Small focus of linear high attenuation in the left parietal lobe appear similar to the prior exam. The amount of edema at has increased compared to the prior exam suggesting a subacute process. This appears wedge-shaped in may represent an embolic infarct. There is a similar faint area of high attenuation in the left frontal lobe on image number 22 low with a more subtle area in the right frontal lobe that is more equivocal. The dural venous sinuses appear within normal limits. No mass lesion or midline shift. No hydrocephalus.  The calvarium is intact. Left-sided nasal septal spur. Mastoid air cells clear. Paranasal sinuses are normal.  IMPRESSION: 1. Increasing edema around linear high attenuation in the left parietal lobe. Followup MRI with and without contrast is recommended for further assessment. The patient presents with an infectious syndrome and septic emboli  are in the differential considerations. Infarct with a small amount of hemorrhage or cortical laminar necrosis as well as developmental venous anomaly remain in the differential considerations. MRI should be useful in further characterization. 2. Dr. Leonel Ramsay called discuss the case at 0230 hr on the day of examination.   Electronically Signed   By: Dereck Ligas M.D.   On: 09/09/2013 02:32   Dg Chest Port 1 View  09/08/2013   CLINICAL DATA:  Chest pain, hypertension  EXAM: PORTABLE CHEST - 1 VIEW  COMPARISON:  September 06, 2013  FINDINGS: The heart size and mediastinal contours are stable. Patient is status post prior CABG. The heart size is enlarged. Patchy consolidation of left lung base is identified. There is mild atelectasis of right lung base. There is no pulmonary edema. There is probable small left pleural effusion. The visualized skeletal structures are stable.  IMPRESSION: Early pneumonia of left lung base with small left pleural effusion.   Electronically Signed   By: Abelardo Diesel M.D.   On: 09/08/2013 23:41      MDM   Final diagnoses:  Sepsis  PNA (pneumonia)  UTI (lower urinary tract infection)    The patient appears acutely ill, this is likely the sequela of a febrile illness, his leukocytosis has slightly worsened at 17,900, he is now mildly hyponatremic but no significant electrolyte abnormalities, he is hyperglycemic at this time. He does have a urinary tract infection with many bacteria and 21-50 red blood cells, 7-10 white blood cells. Nitrite negative. Proteinuria has increased since the last visit. We'll add on lactic acid, chest x-ray, ammonia, anticipate admission to the hospital. Broad spectrum antibiotics ordered as the patient does appear septic with tachycardia, hypoxia, leukocytosis and fever.  The patient has abnormalities on the x-ray as well as urinary infection, the CT scan shows several areas that could be consistent with septic emboli. I discussed the care  with the hospitalist as where as the neurologist Dr. Leonel Ramsay who agrees with broad spectrum antibiotics and MRI. The patient is septic from this infection, critical care has been delivered to help with this aggressive and severe infection and his altered mental status.  CRITICAL CARE Performed  by: Elton Catalano D Total critical care time: 35 Critical care time was exclusive of separately billable procedures and treating other patients. Critical care was necessary to treat or prevent imminent or life-threatening deterioration. Critical care was time spent personally by me on the following activities: development of treatment plan with patient and/or surrogate as well as nursing, discussions with consultants, evaluation of patient's response to treatment, examination of patient, obtaining history from patient or surrogate, ordering and performing treatments and interventions, ordering and review of laboratory studies, ordering and review of radiographic studies, pulse oximetry and re-evaluation of patient's condition.   Johnna Acosta, MD 09/09/13 [phone]

## 2013-09-09 ENCOUNTER — Emergency Department (HOSPITAL_COMMUNITY): Payer: Medicaid Other

## 2013-09-09 ENCOUNTER — Inpatient Hospital Stay (HOSPITAL_COMMUNITY): Payer: Medicaid Other

## 2013-09-09 DIAGNOSIS — I76 Septic arterial embolism: Secondary | ICD-10-CM | POA: Diagnosis present

## 2013-09-09 DIAGNOSIS — D6959 Other secondary thrombocytopenia: Secondary | ICD-10-CM | POA: Diagnosis not present

## 2013-09-09 DIAGNOSIS — D696 Thrombocytopenia, unspecified: Secondary | ICD-10-CM

## 2013-09-09 DIAGNOSIS — E039 Hypothyroidism, unspecified: Secondary | ICD-10-CM

## 2013-09-09 DIAGNOSIS — G0491 Myelitis, unspecified: Secondary | ICD-10-CM

## 2013-09-09 DIAGNOSIS — D693 Immune thrombocytopenic purpura: Secondary | ICD-10-CM | POA: Diagnosis not present

## 2013-09-09 DIAGNOSIS — R509 Fever, unspecified: Secondary | ICD-10-CM

## 2013-09-09 DIAGNOSIS — I059 Rheumatic mitral valve disease, unspecified: Secondary | ICD-10-CM

## 2013-09-09 DIAGNOSIS — J189 Pneumonia, unspecified organism: Secondary | ICD-10-CM | POA: Diagnosis present

## 2013-09-09 DIAGNOSIS — A419 Sepsis, unspecified organism: Secondary | ICD-10-CM | POA: Diagnosis present

## 2013-09-09 DIAGNOSIS — F411 Generalized anxiety disorder: Secondary | ICD-10-CM

## 2013-09-09 DIAGNOSIS — I2690 Septic pulmonary embolism without acute cor pulmonale: Secondary | ICD-10-CM

## 2013-09-09 DIAGNOSIS — R4182 Altered mental status, unspecified: Secondary | ICD-10-CM

## 2013-09-09 DIAGNOSIS — E890 Postprocedural hypothyroidism: Secondary | ICD-10-CM | POA: Insufficient documentation

## 2013-09-09 DIAGNOSIS — G0490 Encephalitis and encephalomyelitis, unspecified: Secondary | ICD-10-CM | POA: Diagnosis present

## 2013-09-09 DIAGNOSIS — Z954 Presence of other heart-valve replacement: Secondary | ICD-10-CM

## 2013-09-09 LAB — COMPREHENSIVE METABOLIC PANEL
ALT: 37 U/L (ref 0–53)
AST: 48 U/L — ABNORMAL HIGH (ref 0–37)
Albumin: 2.5 g/dL — ABNORMAL LOW (ref 3.5–5.2)
Alkaline Phosphatase: 66 U/L (ref 39–117)
BUN: 18 mg/dL (ref 6–23)
CO2: 21 mEq/L (ref 19–32)
Calcium: 8 mg/dL — ABNORMAL LOW (ref 8.4–10.5)
Chloride: 98 mEq/L (ref 96–112)
Creatinine, Ser: 0.93 mg/dL (ref 0.50–1.35)
GFR calc Af Amer: >90 mL/min (ref >90)
GFR calc non Af Amer: >90 mL/min (ref >90)
Glucose, Bld: 184 mg/dL — ABNORMAL HIGH (ref 70–99)
Potassium: 3 mEq/L — ABNORMAL LOW (ref 3.7–5.3)
Sodium: 134 mEq/L — ABNORMAL LOW (ref 137–147)
Total Bilirubin: 0.9 mg/dL (ref 0.3–1.2)
Total Protein: 5.7 g/dL — ABNORMAL LOW (ref 6.0–8.3)

## 2013-09-09 LAB — CBC
HCT: 40.1 % (ref 39.0–52.0)
Hemoglobin: 14.6 g/dL (ref 13.0–17.0)
MCH: 30.8 pg (ref 26.0–34.0)
MCHC: 36.4 g/dL — ABNORMAL HIGH (ref 30.0–36.0)
MCV: 84.6 fL (ref 78.0–100.0)
Platelets: 30 10*3/uL — ABNORMAL LOW (ref 150–400)
RBC: 4.74 MIL/uL (ref 4.22–5.81)
RDW: 14 % (ref 11.5–15.5)
WBC: 18.2 10*3/uL — ABNORMAL HIGH (ref 4.0–10.5)

## 2013-09-09 LAB — DIC (DISSEMINATED INTRAVASCULAR COAGULATION)PANEL
D-Dimer, Quant: 10.41 ug/mL-FEU — ABNORMAL HIGH (ref 0.00–0.48)
Fibrinogen: 586 mg/dL — ABNORMAL HIGH (ref 204–475)
INR: 0.92 (ref 0.00–1.49)
Platelets: 23 10*3/uL — CL (ref 150–400)
Prothrombin Time: 12.2 seconds (ref 11.6–15.2)
Smear Review: NONE SEEN
aPTT: 29 seconds (ref 24–37)

## 2013-09-09 LAB — HEMOGLOBIN A1C
Hgb A1c MFr Bld: 5.7 % — ABNORMAL HIGH (ref <5.7)
Mean Plasma Glucose: 117 mg/dL — ABNORMAL HIGH (ref <117)

## 2013-09-09 LAB — GLUCOSE, CAPILLARY
Glucose-Capillary: 168 mg/dL — ABNORMAL HIGH (ref 70–99)
Glucose-Capillary: 164 mg/dL — ABNORMAL HIGH (ref 70–99)

## 2013-09-09 LAB — TSH: TSH: 7.675 u[IU]/mL — ABNORMAL HIGH (ref 0.350–4.500)

## 2013-09-09 LAB — AMMONIA: Ammonia: 39 umol/L (ref 11–60)

## 2013-09-09 LAB — SAVE SMEAR

## 2013-09-09 LAB — INFLUENZA PANEL BY PCR (TYPE A & B)
H1N1 flu by pcr: NOT DETECTED
Influenza A By PCR: NEGATIVE
Influenza B By PCR: NEGATIVE

## 2013-09-09 LAB — PROTIME-INR
INR: 0.98 (ref 0.00–1.49)
Prothrombin Time: 12.8 seconds (ref 11.6–15.2)

## 2013-09-09 LAB — MRSA PCR SCREENING: MRSA by PCR: NEGATIVE

## 2013-09-09 MED ORDER — ONDANSETRON HCL 4 MG PO TABS
4.0000 mg | ORAL_TABLET | Freq: Four times a day (QID) | ORAL | Status: DC | PRN
Start: 2013-09-09 — End: 2013-09-09

## 2013-09-09 MED ORDER — POTASSIUM CHLORIDE IN NACL 20-0.9 MEQ/L-% IV SOLN
INTRAVENOUS | Status: AC
  Administered 2013-09-09: 1000 mL via INTRAVENOUS
  Administered 2013-09-10: 02:00:00 via INTRAVENOUS
  Filled 2013-09-09 (×3): qty 1000

## 2013-09-09 MED ORDER — SODIUM CHLORIDE 0.9 % IV SOLN
INTRAVENOUS | Status: DC
  Administered 2013-09-09: 04:00:00 via INTRAVENOUS

## 2013-09-09 MED ORDER — NAFCILLIN SODIUM 2 G IJ SOLR
2.0000 g | INTRAVENOUS | Status: DC
  Administered 2013-09-09 – 2013-10-10 (×167): 2 g via INTRAVENOUS
  Filled 2013-09-09 (×204): qty 2000

## 2013-09-09 MED ORDER — LEVOTHYROXINE SODIUM 100 MCG IV SOLR
87.5000 ug | Freq: Every day | INTRAVENOUS | Status: DC
  Administered 2013-09-09 – 2013-09-14 (×6): 87.5 ug via INTRAVENOUS
  Filled 2013-09-09 (×7): qty 5

## 2013-09-09 MED ORDER — ACETAMINOPHEN 325 MG PO TABS
650.0000 mg | ORAL_TABLET | Freq: Four times a day (QID) | ORAL | Status: DC | PRN
Start: 2013-09-09 — End: 2013-09-09

## 2013-09-09 MED ORDER — VANCOMYCIN HCL IN DEXTROSE 1-5 GM/200ML-% IV SOLN
1000.0000 mg | Freq: Three times a day (TID) | INTRAVENOUS | Status: DC
  Administered 2013-09-09 – 2013-09-10 (×6): 1000 mg via INTRAVENOUS
  Filled 2013-09-09 (×8): qty 200

## 2013-09-09 MED ORDER — SODIUM CHLORIDE 0.9 % IV SOLN: INTRAVENOUS | Status: DC

## 2013-09-09 MED ORDER — NAFCILLIN SODIUM 2 G IJ SOLR
2.0000 g | Freq: Once | INTRAVENOUS | Status: AC
  Administered 2013-09-09: 2 g via INTRAVENOUS
  Filled 2013-09-09: qty 2000

## 2013-09-09 MED ORDER — ONDANSETRON HCL 4 MG/2ML IJ SOLN
4.0000 mg | Freq: Four times a day (QID) | INTRAMUSCULAR | Status: DC | PRN
  Administered 2013-09-16 – 2013-10-07 (×7): 4 mg via INTRAVENOUS
  Filled 2013-09-09 (×8): qty 2

## 2013-09-09 MED ORDER — DEXTROSE 5 % IV SOLN
2.0000 g | Freq: Three times a day (TID) | INTRAVENOUS | Status: DC
  Administered 2013-09-09 (×2): 2 g via INTRAVENOUS
  Filled 2013-09-09 (×2): qty 2

## 2013-09-09 MED ORDER — POTASSIUM CHLORIDE 10 MEQ/100ML IV SOLN
10.0000 meq | INTRAVENOUS | Status: AC
  Administered 2013-09-09 (×4): 10 meq via INTRAVENOUS
  Filled 2013-09-09 (×4): qty 100

## 2013-09-09 MED ORDER — GADOBENATE DIMEGLUMINE 529 MG/ML IV SOLN
19.0000 mL | Freq: Once | INTRAVENOUS | Status: AC | PRN
  Administered 2013-09-09: 19 mL via INTRAVENOUS

## 2013-09-09 MED ORDER — NAFCILLIN SODIUM 2 G IJ SOLR: 2.0000 g | INTRAMUSCULAR | Status: DC

## 2013-09-09 MED ORDER — ONDANSETRON HCL 4 MG/2ML IJ SOLN: 4.0000 mg | Freq: Three times a day (TID) | INTRAMUSCULAR | Status: DC | PRN

## 2013-09-09 MED ORDER — METOPROLOL TARTRATE 1 MG/ML IV SOLN
2.5000 mg | INTRAVENOUS | Status: DC | PRN
  Administered 2013-09-11: 2.5 mg via INTRAVENOUS
  Filled 2013-09-09: qty 5

## 2013-09-09 MED ORDER — INSULIN ASPART 100 UNIT/ML ~~LOC~~ SOLN
0.0000 [IU] | SUBCUTANEOUS | Status: DC
  Administered 2013-09-09 (×2): 2 [IU] via SUBCUTANEOUS
  Administered 2013-09-10 (×4): 1 [IU] via SUBCUTANEOUS
  Administered 2013-09-10: 2 [IU] via SUBCUTANEOUS
  Administered 2013-09-10 – 2013-09-11 (×4): 1 [IU] via SUBCUTANEOUS
  Administered 2013-09-11: 2 [IU] via SUBCUTANEOUS
  Administered 2013-09-11: 1 [IU] via SUBCUTANEOUS
  Administered 2013-09-11: 2 [IU] via SUBCUTANEOUS
  Administered 2013-09-12: 1 [IU] via SUBCUTANEOUS
  Administered 2013-09-12: 2 [IU] via SUBCUTANEOUS
  Administered 2013-09-12: 1 [IU] via SUBCUTANEOUS
  Administered 2013-09-12: 2 [IU] via SUBCUTANEOUS
  Administered 2013-09-12: 3 [IU] via SUBCUTANEOUS
  Administered 2013-09-13: 1 [IU] via SUBCUTANEOUS
  Administered 2013-09-13 (×3): 2 [IU] via SUBCUTANEOUS
  Administered 2013-09-13: 21:00:00 via SUBCUTANEOUS
  Administered 2013-09-13: 2 [IU] via SUBCUTANEOUS
  Administered 2013-09-14 – 2013-09-15 (×3): 1 [IU] via SUBCUTANEOUS
  Administered 2013-09-16: 2 [IU] via SUBCUTANEOUS
  Administered 2013-09-16: 1 [IU] via SUBCUTANEOUS
  Administered 2013-09-16 – 2013-09-18 (×3): 2 [IU] via SUBCUTANEOUS
  Administered 2013-09-18 (×3): 1 [IU] via SUBCUTANEOUS
  Administered 2013-09-19: 2 [IU] via SUBCUTANEOUS
  Administered 2013-09-19: 1 [IU] via SUBCUTANEOUS

## 2013-09-09 MED ORDER — SODIUM CHLORIDE 0.9 % IJ SOLN
3.0000 mL | Freq: Two times a day (BID) | INTRAMUSCULAR | Status: DC
  Administered 2013-09-09: 10:00:00 via INTRAVENOUS
  Administered 2013-09-10 – 2013-09-13 (×8): 3 mL via INTRAVENOUS
  Administered 2013-09-14: 10 mL via INTRAVENOUS
  Administered 2013-09-14 – 2013-09-15 (×3): 3 mL via INTRAVENOUS
  Administered 2013-09-16: 13:00:00 via INTRAVENOUS
  Administered 2013-09-17 – 2013-09-18 (×2): 3 mL via INTRAVENOUS
  Administered 2013-09-18 – 2013-09-19 (×2): 10 mL via INTRAVENOUS
  Administered 2013-09-20 – 2013-09-21 (×2): 3 mL via INTRAVENOUS
  Administered 2013-09-22: 10 mL via INTRAVENOUS
  Administered 2013-09-22 – 2013-10-09 (×21): 3 mL via INTRAVENOUS

## 2013-09-09 MED ORDER — LORAZEPAM 2 MG/ML IJ SOLN
1.0000 mg | Freq: Once | INTRAMUSCULAR | Status: AC
  Administered 2013-09-09: 1 mg via INTRAVENOUS
  Filled 2013-09-09: qty 1

## 2013-09-09 MED ORDER — ACETAMINOPHEN 650 MG RE SUPP
650.0000 mg | Freq: Four times a day (QID) | RECTAL | Status: DC | PRN
  Administered 2013-09-09 – 2013-09-14 (×6): 650 mg via RECTAL
  Filled 2013-09-09 (×7): qty 1

## 2013-09-09 MED ORDER — GENTAMICIN SULFATE 40 MG/ML IJ SOLN
1.0000 mg/kg | Freq: Three times a day (TID) | INTRAVENOUS | Status: DC
  Administered 2013-09-09 – 2013-09-11 (×7): 90 mg via INTRAVENOUS
  Filled 2013-09-09 (×8): qty 2.25

## 2013-09-09 NOTE — Consult Note (Signed)
[place]  Telephone:(336) [place] CONSULTATION NOTE  Bradley [name]                                MR#: [id number]  DOB: 03/30/1956                       CSN#: [id number]  Referring MD: [facility]      Primary MD: Dr. [name]  Reason for Consult: Leukocytosis, Anemia and Thrombocytopenia   Bradley [name] is a 58 y.o. male admitted with altered mental status following a recent diagnosis of flu treated with Tamiflu. Accompanying symptoms included 3 week history of generalized weakness and fatigue. He also had episodes of vomiting and loose stools, starting 09/06/13, not bloody .Per chart report, he did notice a "red rash over the body"  On admission he was found to have Leukocytosis with a WBC of 17.9 (was 17 at the time of flu diagnosis on 2/7 at the ED). H/H was 15.6/44.1 and platelets were low at 39,000 (were 100k on 2/7). Differential on 2/7 were: Mono 1.3 and Lymphs at 0.3. Renal function was normal. Liver functions were essentially unremarkable. The urine however, showed a large hemoglobin, and protein was 100, few leukocytest. Nitrite was negative. Today's CBC shows a normal hemoglobin and hematocrit of 14.6 and 40.1 respectively, with a white count of 18.2, and platelets dropping from 30 K this morning, to  23,000 now Of note, the patient has been on vancomycin since admission.  CT of the head today (2/10) showed a small focus of linear high attenuation in the left parietal lobe, increased compared to the prior exam on 09/06/2013 suggesting a subacute process. This appears wedge-shaped and may represent an embolic infarct. There is a similar faint area of high attenuation in the left frontal lobe with a more subtle area in the right frontal lobe that is more equivocal. The dural venous sinuses appear within normal limits. No mass lesion or midline shift. No hydrocephalus. The calvarium is intact.  CT of the abdomen and pelvis on  09/06/2013 was negative, specifically without bowel obstruction, masses, pneumoperitoneum, or fluid. The liver and  spleen were normal. MRI of the brain is to be performed today.  Dr. Jana Hakim requested a DIC panel and HIT test to start the workup pending his visit later today.  PMH:  Past Medical History  Diagnosis Date  . Anxiety   . Depression/ Bipolar disorder/ Obsessive compulsive disorder   . History of thyroid cancer s/p thyroidectomy 2005   . Hypertension   . Hypothyroidism, postsurgical   . Aortic stenosis     s/p Bentall with bioprosthetic AVR 02/2010; Last echo (9/11): Moderate LVH, EF 45-50%, AVR functioning appropriately, aortic valve mean gradient 21, diastolic dysfunction. Chest MRA (2/13): Mild to moderate dilatation at the sinus of Valsalva at 4.1 cm, mild dilatation ascending aorta distal to the tube graft at 3.9 cm, moderate dilatation of the innominate artery a 2.1 cm;    . Hx of cardiac cath     a. LHC in 02/2010: normal cors  . Hx of echocardiogram 2014    Echo (10/14): Severe LVH, EF 60-65%, normal wall motion, grade 1 diastolic dysfunction, AVR functioning normally, mild aortic stenosis (mean 19), moderately dilated aorta, mild LAE, mild RVE    Surgeries:  Past Surgical History  Procedure Laterality Date  . Cardiac  catheterization  03/02/2010    NORMAL CORONARY ARTERY  . Sternotomy      REDO  . Transthoracic echocardiogram  03/2010    SHOWED MILD REDUCTION OF LV FUNCTION  . Thyroidectomy  2005  . Rotator cuff repair  2012    Right    Allergies: No Known Allergies  Medications:   Prior to Admission:  Prescriptions prior to admission  Medication Sig Dispense Refill  . acetaminophen (TYLENOL) 500 MG tablet Take 500 mg by mouth every 6 (six) hours as needed for mild pain or fever.      Marland Kitchen aspirin 325 MG tablet Take 325 mg by mouth daily.        Marland Kitchen ibuprofen (ADVIL,MOTRIN) 200 MG tablet Take 200 mg by mouth every 6 (six) hours as needed for fever.      .  levothyroxine (SYNTHROID, LEVOTHROID) 175 MCG tablet Take 175 mcg by mouth daily.      Marland Kitchen lisinopril (PRINIVIL,ZESTRIL) 10 MG tablet Take 1 tablet (10 mg total) by mouth daily.  30 tablet  11  . metoprolol (LOPRESSOR) 50 MG tablet Take 25 mg by mouth 2 (two) times daily.      . multivitamin (THERAGRAN) per tablet Take 1 tablet by mouth daily.        Marland Kitchen oseltamivir (TAMIFLU) 75 MG capsule Take 1 capsule (75 mg total) by mouth every 12 (twelve) hours.  10 capsule  0  . sertraline (ZOLOFT) 50 MG tablet Take 50 mg by mouth daily.       Scheduled Meds: . sodium chloride   Intravenous STAT  . ceFEPime (MAXIPIME) IV  2 g Intravenous Q8H  . gentamicin  1 mg/kg Intravenous Q8H  . LORazepam  1 mg Intravenous Once  . potassium chloride  10 mEq Intravenous Q1 Hr x 4  . sodium chloride  3 mL Intravenous Q12H  . vancomycin  1,000 mg Intravenous Q8H   Continuous Infusions: . sodium chloride    . 0.9 % NaCl with KCl 20 mEq / L     PRN Meds:.acetaminophen, acetaminophen, metoprolol, ondansetron (ZOFRAN) IV, ondansetron (ZOFRAN) IV, ondansetron  ROS: No gum bleed.wife states that the patient has been spitting some blood before these admission He does have easy bruising, in the setting of blood thinner with aspirin (patient has aortic valve replacement).Decreased appetite.of note, the patient's wife reports that he works outdoors, in [employer], and has been recently bit  by insect and had exposure to ticks (the son states that in the recent past has had several tick bites)  Patient had never been  by a hematologist. Never had a transfusion in the past.Never had a bone marrow biopsy .Wife denies risk factors for HIV or hepatitis. Otherwise:  Constitutional: Positive for weight loss. Negative for fever, chills or  night sweats.positivefor  fatigue.  Eyes: Negative for blurred vision and double vision.  Respiratory: Negative for cough. No hemoptysis. No shortness of breath. No pleuritic chest pain.    Cardiovascular: Negative for chest pain. No palpitations.  GI: positive for  nausea, vomiting, diarrhea or constipation. No change in bowel caliber. No  Melena or hematochezia. No abdominal pain.  GU: Negative for hematuria. No loss of urinary control.No urinary retention. Skin: Negative for itching."rash all over the body". Easy  Bruising due to ASA. Ticks exposure as above. Neurological: positive headaches. Altered mental status as per history of present illness.  Family History:    Family History  Problem Relation Age of Onset  . Heart disease/stroke Father  83     Hypothyroidism                                                     Mother  No family history of hematological  disorders.  Social History:  reports that he has never smoked. He has never used smokeless tobacco. He reports that he does not drink alcohol or use illicit drugs. Married. 4 children. 6 siblings in good health. Lives in [place]. Works with horses outdoors, and does farming. Full code  Physical Exam    Filed Vitals:   09/09/13 0800  BP: 124/96  Pulse: 122  Temp: 100.7 F (38.2 C)  Resp: 41    General: 58 y.o. male  in no acute distress ,confused.Ill appearing HEENT: Normocephalic, atraumatic, PERRLA. Oral cavity without thrush or lesions. Neck supple. no thyromegaly, no cervical or supraclavicular adenopathy  Lungs clear bilaterally . No wheezing, rhonchi or rales. No axillary masses. Breasts: not examined. Cardiac regular rate and UYQIHK,7/4 systolicmurmur , rubs or gallops Abdomen soft nontender , bowel sounds x4. No HSM. No masses palpable.  GU/rectal: deferred. Extremities no clubbing cyanosis or edema. Right elbow area,shows erythema, warm to the touch. He also has another area of redness at the left venipuncture site. Purple discoloration on the right knee, and several other areas of erythema.. No inguinal lymph nodes palpable.  Neuro:the patient is not oriented,and he is not cooperating with  simple commands. He is refusing to open his mouth . His right eye is laterally deviated   Left body weakness, with minimal strength on the left upper extremity, and almost absent on the left lower extremity  ,Labs:    Recent Labs Lab 09/06/13 2018 09/08/13 2200 09/09/13 0545  WBC 17.0* 17.9* 18.2*  HGB 14.2 15.6 14.6  HCT 41.3 44.1 40.1  PLT 100* 39* 30*  MCV 88.4 86.1 84.6  MCH 30.4 30.5 30.8  MCHC 34.4 35.4 36.4*  RDW 13.8 14.0 14.0  LYMPHSABS 0.3*  --   --   MONOABS 1.3*  --   --   EOSABS 0.0  --   --   BASOSABS 0.0  --   --        Recent Labs Lab 09/06/13 2018 09/08/13 2200 09/09/13 0545  NA 136* 130* 134*  K 3.6* 3.1* 3.0*  CL 98 92* 98  CO2 '23 22 21  ' GLUCOSE 163* 258* 184*  BUN '19 18 18  ' CREATININE 0.90 0.76 0.93  CALCIUM 9.2 8.8 8.0*  AST 35 44* 48*  ALT 31 40 37  ALKPHOS 57 87 66  BILITOT 0.7 0.7 0.9        Component Value Date/Time   BILITOT 0.9 09/09/2013 0545   BILIDIR 0.2 02/14/2010 1523      Recent Labs Lab 09/06/13 2018 09/09/13 0545  INR 1.10 0.98    No results found for this basename: DDIMER,  in the last 72 hours   Anemia panel:  No results found for this basename: VITAMINB12, FOLATE, FERRITIN, TIBC, IRON, RETICCTPCT,  in the last 72 hours  Urinalysis    Component Value Date/Time   COLORURINE YELLOW 09/08/2013 2203   APPEARANCEUR CLOUDY* 09/08/2013 2203   LABSPEC 1.021 09/08/2013 2203   PHURINE 6.0 09/08/2013 2203   GLUCOSEU 100* 09/08/2013 2203   HGBUR LARGE* 09/08/2013 [place] 09/08/2013 2203  KETONESUR NEGATIVE 09/08/2013 2203   PROTEINUR 100* 09/08/2013 2203   UROBILINOGEN 1.0 09/08/2013 2203   NITRITE NEGATIVE 09/08/2013 2203   LEUKOCYTESUR SMALL* 09/08/2013 2203    Drugs of Abuse     Component Value Date/Time   LABOPIA NONE DETECTED 09/06/2013 2237   COCAINSCRNUR NONE DETECTED 09/06/2013 2237   LABBENZ NONE DETECTED 09/06/2013 2237   AMPHETMU NONE DETECTED 09/06/2013 2237   THCU POSITIVE* 09/06/2013 2237   LABBARB  NONE DETECTED 09/06/2013 2237      Imaging Studies:  Dg Chest 2 View  09/06/2013   CLINICAL DATA:  Fever, nausea, vomiting.  EXAM: CHEST  2 VIEW  COMPARISON:  08/17/2011  FINDINGS: Prior median sternotomy and valve replacement. Mild cardiomegaly and vascular congestion. Low lung volumes with bibasilar atelectasis. No effusions. No acute bony abnormality.  IMPRESSION: Cardiomegaly, bibasilar atelectasis.   Electronically Signed   By: Rolm Baptise M.D.   On: 09/06/2013 19:34   Ct Head Wo Contrast  09/09/2013   CLINICAL DATA:  Fever.  Altered mental status.  EXAM: CT HEAD WITHOUT CONTRAST  TECHNIQUE: Contiguous axial images were obtained from the base of the skull through the vertex without intravenous contrast.  COMPARISON:  CT HEAD W/O CM dated 09/06/2013; DG CHEST 1V PORT dated 09/08/2013; CT ABD/PELVIS W CM dated 09/06/2013  FINDINGS: Small focus of linear high attenuation in the left parietal lobe appear similar to the prior exam. The amount of edema at has increased compared to the prior exam suggesting a subacute process. This appears wedge-shaped in may represent an embolic infarct. There is a similar faint area of high attenuation in the left frontal lobe on image number 22 low with a more subtle area in the right frontal lobe that is more equivocal. The dural venous sinuses appear within normal limits. No mass lesion or midline shift. No hydrocephalus.  The calvarium is intact. Left-sided nasal septal spur. Mastoid air cells clear. Paranasal sinuses are normal.  IMPRESSION: 1. Increasing edema around linear high attenuation in the left parietal lobe. Followup MRI with and without contrast is recommended for further assessment. The patient presents with an infectious syndrome and septic emboli are in the differential considerations. Infarct with a small amount of hemorrhage or cortical laminar necrosis as well as developmental venous anomaly remain in the differential considerations. MRI should be useful in  further characterization. 2. Dr. Leonel Ramsay called discuss the case at 0230 hr on the day of examination.   Electronically Signed   By: Dereck Ligas M.D.   On: 09/09/2013 02:32   Ct Head Wo Contrast  09/06/2013   CLINICAL DATA:  Confusion.  EXAM: CT HEAD WITHOUT CONTRAST  TECHNIQUE: Contiguous axial images were obtained from the base of the skull through the vertex without intravenous contrast.  COMPARISON:  None.  FINDINGS: There is a 1.3 cm curvilinear focus of hyperdensity in the subcortical left parietal lobe (image 25). There is no evidence of surrounding edema. There is no other evidence of acute intracranial hemorrhage. There is no evidence of acute cortical infarct, mass, midline shift, or extra-axial fluid collection. Subcentimeter low density focus in the left caudate is suggestive of a remote lacunar infarct. Ventricles and sulci are normal. There is very mild mucosal thickening in the frontal sinuses and ethmoid air cells bilaterally. Mastoid air cells are clear. Orbits are unremarkable.  IMPRESSION: 1. Curvilinear hyperdensity in the subcortical left parietal lobe. Acute intracranial hemorrhage cannot be completely excluded, however there is no surrounding edema or evidence of hemorrhage  elsewhere in the brain and the patient is not presenting in the setting of acute trauma. Given the curvilinear configuration, this may be vascular and represent an incidental developmental venous anomaly. 2. Remote left caudate lacunar infarct.   Electronically Signed   By: Logan Bores   On: 09/06/2013 22:29   Ct Abdomen Pelvis W Contrast  09/06/2013   CLINICAL DATA:  58 year old male abdominal pain, vomiting, diarrhea and low grade fever.  EXAM: CT ABDOMEN AND PELVIS WITH CONTRAST  TECHNIQUE: Multidetector CT imaging of the abdomen and pelvis was performed using the standard protocol following bolus administration of intravenous contrast.  CONTRAST:  100 cc intravenous Omnipaque 300  COMPARISON:  None.   FINDINGS: Cardiomegaly and aortic valve replacement noted. Mild bibasilar atelectasis/scarring is identified.  The liver, spleen, pancreas, gallbladder adrenal glands and kidneys are unremarkable except for small bilateral renal cysts.  There is no evidence of free fluid, enlarged lymph nodes, biliary dilation or abdominal aortic aneurysm.  The bowel, bladder and appendix are unremarkable. There is no evidence of bowel obstruction, abscess or pneumoperitoneum.  A small right inguinal hernia containing fat is identified.  Mild prostate enlargement is present.  No acute or suspicious bony abnormalities are identified.  IMPRESSION: No evidence of acute abnormality.  Cardiomegaly, aortic valve replacement, prostate enlargement and small right inguinal hernias containing fat.   Electronically Signed   By: Hassan Rowan M.D.   On: 09/06/2013 22:32   Dg Chest Port 1 View  09/08/2013   CLINICAL DATA:  Chest pain, hypertension  EXAM: PORTABLE CHEST - 1 VIEW  COMPARISON:  September 06, 2013  FINDINGS: The heart size and mediastinal contours are stable. Patient is status post prior CABG. The heart size is enlarged. Patchy consolidation of left lung base is identified. There is mild atelectasis of right lung base. There is no pulmonary edema. There is probable small left pleural effusion. The visualized skeletal structures are stable.  IMPRESSION: Early pneumonia of left lung base with small left pleural effusion.   Electronically Signed   By: Abelardo Diesel M.D.   On: 09/08/2013 23:41      A/P: 58 y.o. male with progressive thrombocytopenia in the setting of an acute febrile  illness,with acute mental status changes following a recent "flu" episode, being evaluated for possible endocarditis with septic emboli.  Dr.Eduardo Honor is to see the patient following this consult with recommendations regarding diagnosis and  further workup. Appreciate being involved in this patient's care.  Rondel Jumbo, PA-C 09/09/2013 10:11  AM   ADDENDUM: The patient's coagulation studies do not indicate TTP/HUS--there are no schistocytes, creatinine is <1.0, there is no anemia. DIC is not ruled out by the patient's normal PT and APTT; however the fibrinogen is >500. If there is an element of DIC, treatment is therapy of the primary problem (in this case sepsis) as you are already doing. The rapid drop in platelets  Results for MAJD, TISSUE (MRN [id number]) as of 09/09/2013 19:32  Ref. Range 08/16/2011 17:35 08/17/2011 19:45 08/18/2011 03:50 05/06/2013 15:17 09/06/2013 20:18 09/08/2013 22:00 09/09/2013 05:45 09/09/2013 09:58  Platelets Latest Range: 150-400 K/uL 195 163 159 171.0 100 (L) 39 (L) 30 (L) 23 (LL)   in the absence of a drop in red cells or white cells leads away from a primary bone marrow problem. This leaves [place] with a consideration of an immune reaction caused by the patient's infection or the drugs he has received to treat it. I think ITP is much less likely.  Accordingly all I would suggest as far as the thrombocytopenia is concerned is continued follow up (have written for repeat DIC panel in AM) and supportive care. If there is overt bleeding or the platelets drop to 10K or less would proceed to platelet transfusion. Will follow with you to resolution.  Chauncey Cruel, MD

## 2013-09-09 NOTE — Progress Notes (Signed)
INITIAL NUTRITION ASSESSMENT  DOCUMENTATION CODES Per approved criteria  -Not Applicable   INTERVENTION: - Diet advancement per MD - Will continue to monitor   NUTRITION DIAGNOSIS: Inadequate oral intake related to inability to eat as evidenced by NPO.   Goal: Advance diet as tolerated to regular diet  Monitor:  Weights, labs, intake   Reason for Assessment: Low braden  58 y.o. male  Admitting Dx: Sepsis  ASSESSMENT: Pt with history of aortic wall replacement x2 with aortic root correction in 2011, hypothyroidism, thyroid CA, hypertension, depression. The patient is coming from home. The patient was brought in by his family member and the history was obtained from the family members as the patient was poor historian due to altered mental status. As per the family the patient started having complaints of generalized weakness tiredness and fatigue that has been ongoing since last 3 weeks. The family denied any complaint of shortness of breath fever or chills or night sweats or diaphoresis or any other symptoms during those 3 weeks. Since last Friday he started having episode of nausea vomiting and diarrhea without any blood. He was also having temperature of 101 with abdominal pain and bloating. At that time he did not have any complaint of chest pain or shortness of breath. Patient was seen in the ED and with the diagnosis of influenza he was sent home on Tamiflu. Patient was brought in today again since his symptoms are progressively worsening. The family denies any complaint of fall or trauma, although he had a mechanical fall a few weeks ago as per the documentation. The patient was having poor appetite, no further episodes of vomiting or diarrhea, worsening mental status changes, and diaphoresis. He also noted some red rash all over her body. No sick contacts no recent problems no recent surgery.  Pt asleep in room, family present. Wife reports pt's appetite has been down for the past  3 weeks with pt consuming just 2 meals/day and drinking 2 Ensure per day. Wife reports weight has been stable. She states that pt did not eat anything over the weekend due to having nausea, vomiting, and diarrhea. Pt had large bottle of gatorade yesterday. Per cardiology notes, pt with sepsis, presumed endocarditis with septic emboli, and CVA.  Potassium low, getting replacement in IVF, and IV replacement AST elevated  Height: Ht Readings from Last 1 Encounters:  09/09/13 6\' 1"  (1.854 m)    Weight: Wt Readings from Last 1 Encounters:  09/09/13 211 lb 10.3 oz (96 kg)    Ideal Body Weight: 184 lb   % Ideal Body Weight: 115%  Wt Readings from Last 10 Encounters:  09/09/13 211 lb 10.3 oz (96 kg)  09/06/13 200 lb (90.719 kg)  05/07/13 208 lb (94.348 kg)  05/06/13 204 lb 6.4 oz (92.715 kg)  04/09/13 203 lb (92.08 kg)  12/13/12 203 lb (92.08 kg)  11/06/12 204 lb (92.534 kg)  10/07/12 212 lb (96.163 kg)  04/08/12 209 lb (94.802 kg)  08/19/11 196 lb (88.905 kg)    Usual Body Weight: 211 lb   % Usual Body Weight: 100%  BMI:  Body mass index is 27.93 kg/(m^2).  Estimated Nutritional Needs: Kcal: 2200-2400 Protein: 115-130g Fluid: 2.2-2.4L/day  Skin: Intact  Diet Order: NPO  EDUCATION NEEDS: -No education needs identified at this time   Intake/Output Summary (Last 24 hours) at 09/09/13 1522 Last data filed at 09/09/13 1408  Gross per 24 hour  Intake 752.25 ml  Output    550 ml  Net 202.25 ml    Last BM: 2/9  Labs:   Recent Labs Lab 09/06/13 2018 09/08/13 2200 09/09/13 0545  NA 136* 130* 134*  K 3.6* 3.1* 3.0*  CL 98 92* 98  CO2 23 22 21   BUN 19 18 18   CREATININE 0.90 0.76 0.93  CALCIUM 9.2 8.8 8.0*  GLUCOSE 163* 258* 184*    CBG (last 3)  No results found for this basename: GLUCAP,  in the last 72 hours  Scheduled Meds: . gentamicin  1 mg/kg Intravenous Q8H  . insulin aspart  0-9 Units Subcutaneous Q4H  . levothyroxine  87.5 mcg Intravenous  Daily  . nafcillin IV  2 g Intravenous Once  . nafcillin IV  2 g Intravenous Q4H  . sodium chloride  3 mL Intravenous Q12H  . vancomycin  1,000 mg Intravenous Q8H    Continuous Infusions: . 0.9 % NaCl with KCl 20 mEq / L      Past Medical History  Diagnosis Date  . Anxiety   . Depression   . History of thyroid cancer   . Hypertension   . Hypothyroidism, postsurgical   . Aortic stenosis     s/p Bentall with bioprosthetic AVR 02/2010; Last echo (9/11): Moderate LVH, EF 45-50%, AVR functioning appropriately, aortic valve mean gradient 21, diastolic dysfunction. Chest MRA (2/13): Mild to moderate dilatation at the sinus of Valsalva at 4.1 cm, mild dilatation ascending aorta distal to the tube graft at 3.9 cm, moderate dilatation of the innominate artery a 2.1 cm;    . Hx of cardiac cath     a. LHC in 02/2010: normal cors  . Hx of echocardiogram 2014    Echo (10/14): Severe LVH, EF 60-65%, normal wall motion, grade 1 diastolic dysfunction, AVR functioning normally, mild aortic stenosis (mean 19), moderately dilated aorta, mild LAE, mild RVE    Past Surgical History  Procedure Laterality Date  . Cardiac catheterization  03/02/2010    NORMAL CORONARY ARTERY  . Sternotomy      REDO  . Transthoracic echocardiogram  03/2010    SHOWED MILD REDUCTION OF LV FUNCTION  . Thyroidectomy    . Rotator cuff repair  2012    Right    Mikey College MS, [place], [place] Pager [phone] After Hours Pager

## 2013-09-09 NOTE — Progress Notes (Signed)
Inpatient Diabetes Program Recommendations  AACE/ADA: New Consensus Statement on Inpatient Glycemic Control (2013)  Target Ranges:  Prepandial:   less than 140 mg/dL      Peak postprandial:   less than 180 mg/dL (1-2 hours)      Critically ill patients:  140 - 180 mg/dL   Reason for Visit: Hyperglycemia  Diabetes history: None noted  Outpatient Diabetes medications: None Current orders for Inpatient glycemic control: None  Results for Bradley [name], Bradley [name] (MRN [id number]) as of 09/09/2013 12:14  Ref. Range 09/06/2013 20:18 09/08/2013 22:00 09/09/2013 05:45  Glucose Latest Range: 70-99 mg/dL 163 (H) 258 (H) 184 (H)   Note: Elevated lab glucose with not history of diabetes.  Request MD consider ordering an A1C.  Also request MD complete either the Glycemic Control Order Set or the Adult ICU Glycemic Control Order Set and check CBG's q 4 hours.  Thank you.  Dinesh Ulysse S. Marcelline Mates, RN, CNS, CDE Inpatient Diabetes Program, team pager [phone]

## 2013-09-09 NOTE — Consult Note (Signed)
Neurology Consultation Reason for Consult: Altered mental status Referring Physician: Marlowe Sax.  CC: Altered mental state  History is obtained from: Patient's family  HPI: Bradley [name] is a 58 y.o. male with a history of aortic stenosis status post bioprosthetic valve who presents with altered mental status and abnormal head CT. He began to feel sick on Friday, staying in bed mostly since that time. He came to the ER on the seventh and was thought to have the flu and discharged home. Since that time, he has had progressive worsening symptoms.  He was brought back to the ER tonight given his severe altered metal status. Here he is found to have a large leukocytosis, chest x-ray with concern for pneumonia, fever, lactic acidosis.  He had a head CT done 2 days ago which showed a linear hyperdensity, but this was thought to likely represent DVA. On repeat CT today there is increasing edema around this as well as a possible other area.    ROS: A 14 point ROS was performed and is negative except as noted in the HPI.  Past Medical History  Diagnosis Date  . Anxiety   . Depression   . History of thyroid cancer   . Hypertension   . Hypothyroidism, postsurgical   . Aortic stenosis     s/p Bentall with bioprosthetic AVR 02/2010; Last echo (9/11): Moderate LVH, EF 45-50%, AVR functioning appropriately, aortic valve mean gradient 21, diastolic dysfunction. Chest MRA (2/13): Mild to moderate dilatation at the sinus of Valsalva at 4.1 cm, mild dilatation ascending aorta distal to the tube graft at 3.9 cm, moderate dilatation of the innominate artery a 2.1 cm;    . Hx of cardiac cath     a. LHC in 02/2010: normal cors  . Hx of echocardiogram 2014    Echo (10/14): Severe LVH, EF 60-65%, normal wall motion, grade 1 diastolic dysfunction, AVR functioning normally, mild aortic stenosis (mean 19), moderately dilated aorta, mild LAE, mild RVE    Family History: No history of strokes  Social  History: Tob: Denies  Exam: Current vital signs: BP 119/70  Pulse 113  Temp(Src) 102.2 F (39 C) (Rectal)  Resp 33  Ht 6\' 1"  (1.854 m)  Wt 90.719 kg (200 lb)  BMI 26.39 kg/m2  SpO2 95% Vital signs in last 24 hours: Temp:  [99.6 F (37.6 C)-102.2 F (39 C)] 102.2 F (39 C) (02/10 0220) Pulse Rate:  [112-139] 113 (02/10 0346) Resp:  [20-41] 33 (02/10 0400) BP: (119-154)/(70-97) 119/70 mmHg (02/10 0400) SpO2:  [92 %-96 %] 95 % (02/10 0346) Weight:  [90.719 kg (200 lb)] 90.719 kg (200 lb) (02/10 0337)  General: In bed, diaphoretic CV: Tachycardic Mental Status: Patient is awake, and is able to answer some simple questions, but his answers are mostly nonsense. He does follow some simple commands. He does not answer any complex questions, and is unable to tell me his name Cranial Nerves: II: Visual Fields are difficult to establish given that he does not blink to threat from either direction or endorse vision, but he does fixate. Pupils are equal, round, and reactive to light.  Discs are difficult to visualize. III,IV, VI: Right gaze preference V: Facial sensation is symmetric to temperature VII: Facial movement is symmetric.  VIII: hearing is intact to voice X, XI, XII: Unable to assess secondary to patient's altered mental status.  Motor: Tone is decreased. Bulk is normal. He moves all extremities spontaneously, though does not comply with formal testing. He  may have some bilateral leg weakness, but this is not clear Sensory: Response to noxious stimuli in all 4 extremities Deep Tendon Reflexes: 2+ and symmetric in the biceps and patellae.  Plantars: Toes are downgoing on the left, upgoing on the right Cerebellar: Does not comply Gait: Does not comply   I have reviewed labs in epic and the results pertinent to this consultation are: Leukocytosis  I have reviewed the images obtained: CT head-linear hyperdensity with surrounding edema, possible other areas as  well  Impression: 58 year old male presenting with altered mental status in the setting of sepsis. I suspect infectious embolic phenomena. I would favor getting an MRI to rule out hemorrhage as an etiology for his hyperdensity. Antithrombotic therapy is not indicated to prevent septic emboli, however given he does take antiplatelet therapy at baseline I would favor holding this for now.   Recommendations: 1) hold antiplatelet therapy pending further workup 2) MRI brain with/without contrast 3) blood cultures 4) agree with broad-spectrum antibiotic coverage 5) echocardiogram 6) would consider infectious disease consult  Roland Rack, MD [facility] [phone]  If 7pm- 7am, please page neurology on call at [phone].

## 2013-09-09 NOTE — Progress Notes (Signed)
  Echocardiogram 2D Echocardiogram has been performed.  Bradley [name] 09/09/2013, 1:50 PM

## 2013-09-09 NOTE — Consult Note (Signed)
[facility]  Total days of antibiotics 2        Day 2 gent        Day 2 cefepime        Day 2 vanco        (1 dose of piptazo)       Reason for Consult: possible prosthetic valve endocarditis    Referring Physician: hongalgi  Principal Problem:   Sepsis Active Problems:   HYPOTHYROIDISM- TSH 7.5   Aortic valve replaced- Bentall propceedure 2011   Hypertension   Depression   LBBB (left bundle branch block)   Major depressive disorder, recurrent, severe with psychotic features- hospitalized in Jan 2013   Generalized anxiety disorder   Altered mental state   Encephalitis   Septic embolism   HCAP (healthcare-associated pneumonia)   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    HPI: Bradley [name] is a 58 y.o. male  with Past medical history of aortic valve replacement in 2008 and with aortic root correction in 2011, hypothyroidism, hypertension, depression. Family reported him feeling poorly x 3 wk with weakness and fatigue that acutely worsened on 09/05/13 with nausea/vomiting/diarrhea and fevers. He was evaluated in the ED on 2/7 and given tamiflu for presumed influenza, discharged home. He returns on 09/08/13 with worsening mental status, ongoing high fevers.The family states that this entails the inability to walk because of loss of balance as well as not knowing where he is going. He is unable to speak clearly, he is unable to answer questions and inappropriate for the situation. In the ED, he was found to be febrile to 102.2, tachycardic, tachypnic. WBC elevated at 18 with left shift, thrombocytopenia of 39, cxr left lower lobe opacity, NCHCT showed left parietal lob edeam and wed-shaped attenuation concerning for embolic infarct. He was evaluated by neurology who recommended mri and initiation of antibiotics for concern for endocarditis with embolic phenomenon. The patient started on vanco, cefepime, and gentamicin in addition to having  blood cx drawn. Blood cx positive <  24hr showing GPCC. MRI of brain shows multiple bilateral supratentorial and right cerebellar regions with acute partially hemorrhagic infarcts. TTE read is pending. Cardiology and heme consultants on board.   Past Medical History  Diagnosis Date  . Anxiety   . Depression   . History of thyroid cancer   . Hypertension   . Hypothyroidism, postsurgical   . Aortic stenosis     s/p Bentall with bioprosthetic AVR 02/2010; Last echo (9/11): Moderate LVH, EF 45-50%, AVR functioning appropriately, aortic valve mean gradient 21, diastolic dysfunction. Chest MRA (2/13): Mild to moderate dilatation at the sinus of Valsalva at 4.1 cm, mild dilatation ascending aorta distal to the tube graft at 3.9 cm, moderate dilatation of the innominate artery a 2.1 cm;    . Hx of cardiac cath     a. LHC in 02/2010: normal cors  . Hx of echocardiogram 2014    Echo (10/14): Severe LVH, EF 60-65%, normal wall motion, grade 1 diastolic dysfunction, AVR functioning normally, mild aortic stenosis (mean 19), moderately dilated aorta, mild LAE, mild RVE    Allergies: No Known Allergies    MEDICATIONS: . sodium chloride   Intravenous STAT  . gentamicin  1 mg/kg Intravenous Q8H  . nafcillin  2 g Intravenous Q4H  . sodium chloride  3 mL Intravenous Q12H  . vancomycin  1,000 mg Intravenous Q8H    History  Substance Use Topics  . Smoking status: Never Smoker   .  Smokeless tobacco: Never Used  . Alcohol Use: No    Family History  Problem Relation Age of Onset  . Heart disease Father     Review of Systems - Unable to obtain due to encephalopathy  OBJECTIVE: Temp:  [97.8 F (36.6 C)-102.2 F (39 C)] 97.8 F (36.6 C) (02/10 1133) Pulse Rate:  [112-139] 125 (02/10 1410) Resp:  [20-41] 24 (02/10 1410) BP: (114-154)/(70-101) 133/99 mmHg (02/10 1410) SpO2:  [92 %-97 %] 95 % (02/10 1410) Weight:  [200 lb (90.719 kg)-211 lb 10.3 oz (96 kg)] 211 lb 10.3 oz (96 kg) (02/10 0500)  Physical Exam  Constitutional: He  awakens to name, but does not respond to questionsHe appears well-developed and well-nourished. No distress.  HENT:  Mouth/Throat: Oropharynx is clear and moist. No oropharyngeal exudate.  Cardiovascular: tachycardic, 3/6 systolic murmur BH R/L USB. Exam reveals no gallop and no friction rub.  Pulmonary/Chest: Effort normal and breath sounds normal. No respiratory distress. He has no wheezes.  Abdominal: Soft. Bowel sounds are normal. He exhibits no distension. There is no tenderness.  Lymphadenopathy:  no cervical adenopathy.  Neurological: He is alert and oriented to person, place, and time.  Skin: + splinter hemorrhage on fingers of right and left hand, and right foot. Mottled looking knees, but extremities are warm and dry. He has scattered pinpoint erythamatous macules  ? petechaiel rash to legs. But also abrasion to right shin Psychiatric: solomnent   LABS: Results for orders placed during the hospital encounter of 09/08/13 (from the past 48 hour(s))  CBC     Status: Abnormal   Collection Time    09/08/13 10:00 PM      Result Value Range   WBC 17.9 (*) 4.0 - 10.5 K/uL   RBC 5.12  4.22 - 5.81 MIL/uL   Hemoglobin 15.6  13.0 - 17.0 g/dL   HCT 44.1  39.0 - 52.0 %   MCV 86.1  78.0 - 100.0 fL   MCH 30.5  26.0 - 34.0 pg   MCHC 35.4  30.0 - 36.0 g/dL   RDW 14.0  11.5 - 15.5 %   Platelets 39 (*) 150 - 400 K/uL   Comment: REPEATED TO VERIFY     SPECIMEN CHECKED FOR CLOTS     PLATELET COUNT CONFIRMED BY SMEAR  COMPREHENSIVE METABOLIC PANEL     Status: Abnormal   Collection Time    09/08/13 10:00 PM      Result Value Range   Sodium 130 (*) 137 - 147 mEq/L   Potassium 3.1 (*) 3.7 - 5.3 mEq/L   Chloride 92 (*) 96 - 112 mEq/L   CO2 22  19 - 32 mEq/L   Glucose, Bld 258 (*) 70 - 99 mg/dL   BUN 18  6 - 23 mg/dL   Creatinine, Ser 0.76  0.50 - 1.35 mg/dL   Calcium 8.8  8.4 - 10.5 mg/dL   Total Protein 7.0  6.0 - 8.3 g/dL   Albumin 3.0 (*) 3.5 - 5.2 g/dL   AST 44 (*) 0 - 37 U/L   ALT 40   0 - 53 U/L   Alkaline Phosphatase 87  39 - 117 U/L   Total Bilirubin 0.7  0.3 - 1.2 mg/dL   GFR calc non Af Amer >90  >90 mL/min   GFR calc Af Amer >90  >90 mL/min   Comment: (NOTE)     The eGFR has been calculated using the CKD EPI equation.     This calculation has  not been validated in all clinical situations.     eGFR's persistently <90 mL/min signify possible Chronic Kidney     Disease.  CULTURE, BLOOD (ROUTINE X 2)     Status: None   Collection Time    09/08/13 10:00 PM      Result Value Range   Specimen Description BLOOD RIGHT ARM     Special Requests BOTTLES DRAWN AEROBIC AND ANAEROBIC 5CC     Culture  Setup Time       Value: 09/09/2013 03:30     Performed at [facility]   Culture       Value: GRAM POSITIVE COCCI IN CLUSTERS     Note: Gram Stain Report Called to,Read Back By and Verified With: NEELY RICHARDSON @ 1220 09/09/13 BY KRAWS     Performed at [facility]   Report Status PENDING    URINALYSIS, ROUTINE W REFLEX MICROSCOPIC     Status: Abnormal   Collection Time    09/08/13 10:03 PM      Result Value Range   Color, Urine YELLOW  YELLOW   APPearance CLOUDY (*) CLEAR   Specific Gravity, Urine 1.021  1.005 - 1.030   pH 6.0  5.0 - 8.0   Glucose, UA 100 (*) NEGATIVE mg/dL   Hgb urine dipstick LARGE (*) NEGATIVE   Bilirubin Urine NEGATIVE  NEGATIVE   Ketones, ur NEGATIVE  NEGATIVE mg/dL   Protein, ur 100 (*) NEGATIVE mg/dL   Urobilinogen, UA 1.0  0.0 - 1.0 mg/dL   Nitrite NEGATIVE  NEGATIVE   Leukocytes, UA SMALL (*) NEGATIVE  URINE MICROSCOPIC-ADD ON     Status: Abnormal   Collection Time    09/08/13 10:03 PM      Result Value Range   WBC, UA 7-10  <3 WBC/hpf   RBC / HPF 21-50  <3 RBC/hpf   Bacteria, UA MANY (*) RARE   Casts GRANULAR CAST (*) NEGATIVE  AMMONIA     Status: None   Collection Time    09/08/13 11:36 PM      Result Value Range   Ammonia 39  11 - 60 umol/L  CULTURE, BLOOD (ROUTINE X 2)     Status: None   Collection Time     09/08/13 11:36 PM      Result Value Range   Specimen Description BLOOD RIGHT ANTECUBITAL     Special Requests BOTTLES DRAWN AEROBIC AND ANAEROBIC 5CC     Culture  Setup Time       Value: 09/09/2013 03:29     Performed at [facility]   Culture       Value: GRAM POSITIVE COCCI IN CLUSTERS     Note: Gram Stain Report Called to,Read Back By and Verified With:  NEELY RICHARDSON @ 1220 09/09/13 BY KRAWS     Performed at [facility]   Report Status PENDING    CG4 I-STAT (LACTIC ACID)     Status: Abnormal   Collection Time    09/08/13 11:45 PM      Result Value Range   Lactic Acid, Venous 4.16 (*) 0.5 - 2.2 mmol/L  MRSA PCR SCREENING     Status: None   Collection Time    09/09/13  4:44 AM      Result Value Range   MRSA by PCR NEGATIVE  NEGATIVE   Comment:            The GeneXpert MRSA Assay (FDA     approved  for NASAL specimens     only), is one component of a     comprehensive MRSA colonization     surveillance program. It is not     intended to diagnose MRSA     infection nor to guide or     monitor treatment for     MRSA infections.  COMPREHENSIVE METABOLIC PANEL     Status: Abnormal   Collection Time    09/09/13  5:45 AM      Result Value Range   Sodium 134 (*) 137 - 147 mEq/L   Potassium 3.0 (*) 3.7 - 5.3 mEq/L   Chloride 98  96 - 112 mEq/L   CO2 21  19 - 32 mEq/L   Glucose, Bld 184 (*) 70 - 99 mg/dL   BUN 18  6 - 23 mg/dL   Creatinine, Ser 0.93  0.50 - 1.35 mg/dL   Calcium 8.0 (*) 8.4 - 10.5 mg/dL   Total Protein 5.7 (*) 6.0 - 8.3 g/dL   Albumin 2.5 (*) 3.5 - 5.2 g/dL   AST 48 (*) 0 - 37 U/L   ALT 37  0 - 53 U/L   Alkaline Phosphatase 66  39 - 117 U/L   Total Bilirubin 0.9  0.3 - 1.2 mg/dL   GFR calc non Af Amer >90  >90 mL/min   GFR calc Af Amer >90  >90 mL/min   Comment: (NOTE)     The eGFR has been calculated using the CKD EPI equation.     This calculation has not been validated in all clinical situations.     eGFR's persistently <90 mL/min  signify possible Chronic Kidney     Disease.  CBC     Status: Abnormal   Collection Time    09/09/13  5:45 AM      Result Value Range   WBC 18.2 (*) 4.0 - 10.5 K/uL   Comment: WHITE COUNT CONFIRMED ON SMEAR   RBC 4.74  4.22 - 5.81 MIL/uL   Hemoglobin 14.6  13.0 - 17.0 g/dL   HCT 40.1  39.0 - 52.0 %   MCV 84.6  78.0 - 100.0 fL   MCH 30.8  26.0 - 34.0 pg   MCHC 36.4 (*) 30.0 - 36.0 g/dL   RDW 14.0  11.5 - 15.5 %   Platelets 30 (*) 150 - 400 K/uL   Comment: PLATELET COUNT CONFIRMED BY SMEAR     SPECIMEN CHECKED FOR CLOTS  PROTIME-INR     Status: None   Collection Time    09/09/13  5:45 AM      Result Value Range   Prothrombin Time 12.8  11.6 - 15.2 seconds   INR 0.98  0.00 - 1.49  TSH     Status: Abnormal   Collection Time    09/09/13  5:45 AM      Result Value Range   TSH 7.675 (*) 0.350 - 4.500 uIU/mL   Comment: Performed at [facility] PCR (TYPE A & B, H1N1)     Status: None   Collection Time    09/09/13  9:17 AM      Result Value Range   Influenza A By PCR NEGATIVE  NEGATIVE   Influenza B By PCR NEGATIVE  NEGATIVE   H1N1 flu by pcr NOT DETECTED  NOT DETECTED   Comment:            The Xpert Flu assay (FDA approved for  nasal aspirates or washes and     nasopharyngeal swab specimens), is     intended as an aid in the diagnosis of     influenza and should not be used as     a sole basis for treatment.     Performed at [facility]  DIC (DISSEMINATED INTRAVASCULAR COAGULATION) PANEL     Status: Abnormal   Collection Time    09/09/13  9:58 AM      Result Value Range   Prothrombin Time 12.2  11.6 - 15.2 seconds   INR 0.92  0.00 - 1.49   aPTT 29  24 - 37 seconds   Fibrinogen 586 (*) 204 - 475 mg/dL   D-Dimer, Quant 10.41 (*) 0.00 - 0.48 ug/mL-FEU   Comment:            AT THE INHOUSE ESTABLISHED CUTOFF     VALUE OF 0.48 ug/mL FEU,     THIS ASSAY HAS BEEN DOCUMENTED     IN THE LITERATURE TO HAVE     A SENSITIVITY AND NEGATIVE       PREDICTIVE VALUE OF AT LEAST     98 TO 99%.  THE TEST RESULT     SHOULD BE CORRELATED WITH     AN ASSESSMENT OF THE CLINICAL     PROBABILITY OF DVT / VTE.   Platelets 23 (*) 150 - 400 K/uL   Comment: SPECIMEN CHECKED FOR CLOTS     REPEATED TO VERIFY     DELTA CHECK NOTED     PLATELET COUNT CONFIRMED BY SMEAR     CRITICAL RESULT CALLED TO, READ BACK BY AND VERIFIED WITH:     RWynetta Emery RN AT 5366 ON 02.10.15 BY SHUEA   Smear Review NO SCHISTOCYTES SEEN    SAVE SMEAR     Status: None   Collection Time    09/09/13 10:59 AM      Result Value Range   Smear Review SMEAR STAINED AND AVAILABLE FOR REVIEW      MICRO:  IMAGING: Ct Head Wo Contrast  09/09/2013   CLINICAL DATA:  Fever.  Altered mental status.  EXAM: CT HEAD WITHOUT CONTRAST  TECHNIQUE: Contiguous axial images were obtained from the base of the skull through the vertex without intravenous contrast.  COMPARISON:  CT HEAD W/O CM dated 09/06/2013; DG CHEST 1V PORT dated 09/08/2013; CT ABD/PELVIS W CM dated 09/06/2013  FINDINGS: Small focus of linear high attenuation in the left parietal lobe appear similar to the prior exam. The amount of edema at has increased compared to the prior exam suggesting a subacute process. This appears wedge-shaped in may represent an embolic infarct. There is a similar faint area of high attenuation in the left frontal lobe on image number 22 low with a more subtle area in the right frontal lobe that is more equivocal. The dural venous sinuses appear within normal limits. No mass lesion or midline shift. No hydrocephalus.  The calvarium is intact. Left-sided nasal septal spur. Mastoid air cells clear. Paranasal sinuses are normal.  IMPRESSION: 1. Increasing edema around linear high attenuation in the left parietal lobe. Followup MRI with and without contrast is recommended for further assessment. The patient presents with an infectious syndrome and septic emboli are in the differential considerations. Infarct  with a small amount of hemorrhage or cortical laminar necrosis as well as developmental venous anomaly remain in the differential considerations. MRI should be useful in further characterization. 2. Dr. Leonel Ramsay called discuss  the case at 0230 hr on the day of examination.   Electronically Signed   By: Dereck Ligas M.D.   On: 09/09/2013 02:32   [name] HY Contrast  09/09/2013   CLINICAL DATA:  Post aortic valve replacement 2011. Presenting with altered mental status, elevated white count and abnormal CT.  EXAM: MRI HEAD WITHOUT AND WITH CONTRAST  TECHNIQUE: Multiplanar, multiecho pulse sequences of the brain and surrounding structures were obtained without and with intravenous contrast.  CONTRAST:  57m MULTIHANCE GADOBENATE DIMEGLUMINE 529 MG/ML IV SOLN  COMPARISON:  09/09/2013 and 09/06/2013 CT.  No comparison MR.  FINDINGS: Exam is motion degraded.  Multiple bilateral supra tentorial and right cerebellar regions of restricted motion some of which are partially hemorrhagic with largest area left parietal lobe. In the present clinical setting, these findings are consistent with acute partially hemorrhagic infarcts and possibly related to septic emboli.  At most there is very minimal enhancement of the left parietal partially hemorrhagic infarct.  No intracranial mass lesion seen separate from the above described findings.  Major intracranial vascular structures are patent.  IMPRESSION: Exam is motion degraded.  Multiple bilateral supra tentorial and right cerebellar regions of restricted motion some of which are partially hemorrhagic with largest area left parietal lobe. In the present clinical setting, these findings are consistent with acute partially hemorrhagic infarcts and possibly related to septic emboli.  These results were called by telephone at the time of interpretation on 09/09/2013 at 11:54 AM to Dr. HAlgis Liming, who verbally acknowledged these results.   Electronically Signed   By: SChauncey CruelM.D.   On: 09/09/2013 12:03   Dg Chest Port 1 View  09/08/2013   CLINICAL DATA:  Chest pain, hypertension  EXAM: PORTABLE CHEST - 1 VIEW  COMPARISON:  September 06, 2013  FINDINGS: The heart size and mediastinal contours are stable. Patient is status post prior CABG. The heart size is enlarged. Patchy consolidation of left lung base is identified. There is mild atelectasis of right lung base. There is no pulmonary edema. There is probable small left pleural effusion. The visualized skeletal structures are stable.  IMPRESSION: Early pneumonia of left lung base with small left pleural effusion.   Electronically Signed   By: WAbelardo DieselM.D.   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  - recommend to change antibiotics to nafcillin, vancomycin and gentamicin. Will d/c cefepime. - await sensitivities nad identification on blood cx - recommend to repeat blood cx on 09/10/13 to document clearance - given his bacteremia and prosthetic valve, would ask cardiology to do TEE. This will likely need to wait until platelet recovers and may need to have cardiac surgeon weigh in on findings as well. - will likely need 6 wks of IV antibiotics, await cultures results to determine final antibiotic regimen   Shenay Torti B. SBisonfor Infectious Diseases [phone]

## 2013-09-09 NOTE — H&P (Signed)
[facility] History and Physical  Patient: Bradley [name]  [id number]  DOB: April 20, 1956  DOS: the patient was seen and examined on 09/09/2013 PCP: [name], MD  Chief Complaint: Altered mental status  HPI: Bradley [name] is a 58 y.o. male with Past medical history of aortic wall replacement x2 with aortic root correction in 2011, hypothyroidism, hypertension, depression. The patient is coming from home. The patient was brought in by his family member and the history was obtained from the family members as the patient was poor historian due to altered mental status. As per the family the patient started having complaints of generalized weakness tiredness and fatigue that has been ongoing since last 3 weeks. The family denied any complaint of shortness of breath fever or chills or night sweats or diaphoresis or any other symptoms during those 3 weeks. Since last Friday he started having episode of nausea vomiting and diarrhea without any blood. He was also having temperature of 101 with abdominal pain and bloating. At that time he did not have any complaint of chest pain or shortness of breath. Patient was seen in the ED and with the diagnosis of influenza he was sent home on Tamiflu. Patient was brought in today again since his symptoms are progressively worsening. The family denies any complaint of fall or trauma, although he had a mechanical fall a few weeks ago as per the documentation. The patient was having poor appetite, no further episodes of vomiting or diarrhea, worsening mental status changes, and diaphoresis. He also noted some red rash all over her body. No sick contacts no recent problems no recent surgery.  Review of Systems: as mentioned in the history of present illness.  A Comprehensive review of the other systems is negative.  Past Medical History  Diagnosis Date  . Anxiety   . Depression   . History of thyroid cancer   . Hypertension   . Hypothyroidism,  postsurgical   . Aortic stenosis     s/p Bentall with bioprosthetic AVR 02/2010; Last echo (9/11): Moderate LVH, EF 45-50%, AVR functioning appropriately, aortic valve mean gradient 21, diastolic dysfunction. Chest MRA (2/13): Mild to moderate dilatation at the sinus of Valsalva at 4.1 cm, mild dilatation ascending aorta distal to the tube graft at 3.9 cm, moderate dilatation of the innominate artery a 2.1 cm;    . Hx of cardiac cath     a. LHC in 02/2010: normal cors  . Hx of echocardiogram 2014    Echo (10/14): Severe LVH, EF 60-65%, normal wall motion, grade 1 diastolic dysfunction, AVR functioning normally, mild aortic stenosis (mean 19), moderately dilated aorta, mild LAE, mild RVE   Past Surgical History  Procedure Laterality Date  . Cardiac catheterization  03/02/2010    NORMAL CORONARY ARTERY  . Sternotomy      REDO  . Transthoracic echocardiogram  03/2010    SHOWED MILD REDUCTION OF LV FUNCTION  . Thyroidectomy    . Rotator cuff repair  2012    Right   Social History:  reports that he has never smoked. He has never used smokeless tobacco. He reports that he does not drink alcohol or use illicit drugs. Independent for most of his  ADL.  No Known Allergies  Family History  Problem Relation Age of Onset  . Heart disease Father     Prior to Admission medications   Medication Sig Start Date End Date Taking? Authorizing Provider  acetaminophen (TYLENOL) 500 MG tablet Take 500 mg by  mouth every 6 (six) hours as needed for mild pain or fever.   Yes Historical Provider, MD  aspirin 325 MG tablet Take 325 mg by mouth daily.     Yes Historical Provider, MD  ibuprofen (ADVIL,MOTRIN) 200 MG tablet Take 200 mg by mouth every 6 (six) hours as needed for fever.   Yes Historical Provider, MD  levothyroxine (SYNTHROID, LEVOTHROID) 175 MCG tablet Take 175 mcg by mouth daily.   Yes Historical Provider, MD  lisinopril (PRINIVIL,ZESTRIL) 10 MG tablet Take 1 tablet (10 mg total) by mouth daily.  04/18/13  Yes [name], MD  metoprolol (LOPRESSOR) 50 MG tablet Take 25 mg by mouth 2 (two) times daily.   Yes Historical Provider, MD  multivitamin [facility]) per tablet Take 1 tablet by mouth daily.     Yes Historical Provider, MD  oseltamivir (TAMIFLU) 75 MG capsule Take 1 capsule (75 mg total) by mouth every 12 (twelve) hours. 09/06/13  Yes Blanchard Kelch, MD  sertraline (ZOLOFT) 50 MG tablet Take 50 mg by mouth daily.   Yes Historical Provider, MD    Physical Exam: Filed Vitals:   09/08/13 2203 09/09/13 0000 09/09/13 0100 09/09/13 0220  BP:  153/86 149/90 154/94  Pulse:  117  139  Temp: 100.7 F (38.2 C)   102.2 F (39 C)  TempSrc: Rectal   Rectal  Resp:  37 41 22  SpO2:  94%  92%    General: Alert, Awake and occasionally follows command disoriented. Appear in marked distress Eyes: PERRL ENT: Oral Mucosa clear dry. Neck: Difficult to assess JVD Cardiovascular: S1 and S2 Present, aortic 4/6 Murmur, Peripheral Pulses Present Respiratory: Bilateral Air entry equal and Decreased, Clear to Auscultation,  No Crackles, no wheezes Abdomen: Bowel Sound Present, Soft and Non tender Skin: Diffuse macular Rash Extremities: No Pedal edema, no calf tenderness, possible splinter hemorrhages, right elbow redness with warmth,  Neurologic: Mental status occasionally follows command completely disoriented, Cranial Nerves pupils are reactive cough reflex present, Motor strength bilaterally equal strength, Sensation withdraws to pain, reflexes ankle reflex present, babinski equivoca, Cerebellar test difficult to assess.  Labs on Admission:  CBC:  Recent Labs Lab 09/06/13 2018 09/08/13 2200  WBC 17.0* 17.9*  NEUTROABS 15.4*  --   HGB 14.2 15.6  HCT 41.3 44.1  MCV 88.4 86.1  PLT 100* 39*    CMP     Component Value Date/Time   NA 130* 09/08/2013 2200   K 3.1* 09/08/2013 2200   CL 92* 09/08/2013 2200   CO2 22 09/08/2013 2200   GLUCOSE 258* 09/08/2013 2200   BUN 18 09/08/2013 2200    CREATININE 0.76 09/08/2013 2200   CALCIUM 8.8 09/08/2013 2200   PROT 7.0 09/08/2013 2200   ALBUMIN 3.0* 09/08/2013 2200   AST 44* 09/08/2013 2200   ALT 40 09/08/2013 2200   ALKPHOS 87 09/08/2013 2200   BILITOT 0.7 09/08/2013 2200   GFRNONAA >90 09/08/2013 2200   GFRAA >90 09/08/2013 2200     Recent Labs Lab 09/06/13 2018  LIPASE 18    Recent Labs Lab 09/06/13 2018 09/08/13 2336  AMMONIA 31 39    No results found for this basename: CKTOTAL, CKMB, CKMBINDEX, TROPONINI,  in the last 168 hours BNP (last 3 results) No results found for this basename: PROBNP,  in the last 8760 hours  Radiological Exams on Admission: Ct Head Wo Contrast  09/09/2013   CLINICAL DATA:  Fever.  Altered mental status.  EXAM: CT HEAD WITHOUT CONTRAST  TECHNIQUE: Contiguous axial images were obtained from the base of the skull through the vertex without intravenous contrast.  COMPARISON:  CT HEAD W/O CM dated 09/06/2013; DG CHEST 1V PORT dated 09/08/2013; CT ABD/PELVIS W CM dated 09/06/2013  FINDINGS: Small focus of linear high attenuation in the left parietal lobe appear similar to the prior exam. The amount of edema at has increased compared to the prior exam suggesting a subacute process. This appears wedge-shaped in may represent an embolic infarct. There is a similar faint area of high attenuation in the left frontal lobe on image number 22 low with a more subtle area in the right frontal lobe that is more equivocal. The dural venous sinuses appear within normal limits. No mass lesion or midline shift. No hydrocephalus.  The calvarium is intact. Left-sided nasal septal spur. Mastoid air cells clear. Paranasal sinuses are normal.  IMPRESSION: 1. Increasing edema around linear high attenuation in the left parietal lobe. Followup MRI with and without contrast is recommended for further assessment. The patient presents with an infectious syndrome and septic emboli are in the differential considerations. Infarct with a small amount of  hemorrhage or cortical laminar necrosis as well as developmental venous anomaly remain in the differential considerations. MRI should be useful in further characterization. 2. Dr. Leonel Ramsay called discuss the case at 0230 hr on the day of examination.   Electronically Signed   By: Dereck Ligas M.D.   On: 09/09/2013 02:32   Dg Chest Port 1 View  09/08/2013   CLINICAL DATA:  Chest pain, hypertension  EXAM: PORTABLE CHEST - 1 VIEW  COMPARISON:  September 06, 2013  FINDINGS: The heart size and mediastinal contours are stable. Patient is status post prior CABG. The heart size is enlarged. Patchy consolidation of left lung base is identified. There is mild atelectasis of right lung base. There is no pulmonary edema. There is probable small left pleural effusion. The visualized skeletal structures are stable.  IMPRESSION: Early pneumonia of left lung base with small left pleural effusion.   Electronically Signed   By: Abelardo Diesel M.D.   On: 09/08/2013 23:41    EKG: Independently reviewed. sinus tachycardia.  Assessment/Plan Principal Problem:   Sepsis Active Problems:   HYPOTHYROIDISM   Aortic valve replaced   Hypertension   Major depressive disorder, recurrent, severe with psychotic features   Hypothyroidism, postsurgical   Altered mental state   Encephalitis   Septic embolism   HCAP (healthcare-associated pneumonia)   1. Sepsis The patient is presenting with complaints of altered mental status. He has leukocytosis with lactic acidosis with thrombocytopenia with pyuria and a chest x-ray suggesting left-sided infiltrate as well as a CT scan of the head suggesting scattered possible septic emboli. He does have history of prosthetic aortic valve. With this that is high likelihood that he has infective endocarditis with possible CNS encephalitis versus toxic encephalopathy. Neurology has evaluated the patient at present recommends MRI of the brain for further workup. Currently patient will be  started on IV broad-spectrum antibiotics including IV vancomycin IV gentamicin and IV cefepime for coverage of infective endocarditis prosthetic valve and encephalitis. I also discussed the case with critical-care on-call who will follow the patient. Blood cultures are obtained urine cultures are pending and sputum cultures will be obtained Patient will be kept n.p.o. at present. Fall precautions, aspiration precautions seizure precautions MRA of the brain is ordered as well as an echocardiogram, if 2-D echocardiogram does not show vegetation he may require TEE.  2.  Hypothyroidism Patient does have history of hypothyroidism currently he has sinus tachycardia with hypertension. I will check TSH and hold his Synthroid until that.  3. Hypertension IV Lopressor when necessary for elevated blood pressure.  4. Possible CNS hemorrhage At present holding aspirin as well as DVT pharmacological prophylaxis as per neurology recommendation. Follow MRI  Consults: Neurology, critical-care appreciate input  DVT Prophylaxis: mechanical compression device Nutrition: N.p.o.  Code Status: Full  Family Communication: Family was present at bedside, guarded prognosis was given, opportunity was given to ask question and all questions were answered satisfactorily at the time of interview. Disposition: Admitted to inpatient in intensive care unit.  Author: Berle Mull, MD [facility] Pager: [phone] 09/09/2013, 3:35 AM    If 7PM-7AM, please contact night-coverage [email or web address] Password [email or web address]

## 2013-09-09 NOTE — Progress Notes (Addendum)
PROGRESS NOTE    Bradley [name] [id number] DOB: August 14, 1955 DOA: 09/08/2013 PCP: [name], MD Primary Cardiologist: Dr. Jenkins Rouge. Primary Cardiothoracic: Surgeon: [name]   HPI/Brief narrative 58 year old male with history of aortic stenosis, status post pediatric aortic valvulotomy at age 65 followed by Bentall procedure 02/2000 with tissue aVR, cardiac cath 02/2010 with normal coronaries, LBBB, thyroid cancer-post thyroidectomy hypothyroidism, HTN and depression has been generally unwell for 2-3 weeks, recently seen in the ED on 09/06/13 and assessed as influenza related vomiting, diarrhea, abdominal pain, body aches, fever and mild confusion. Family was given option of hospitalization versus discharge home-family opted to go home and he was discharged on Tamiflu. However, patient worsened at home with worsening confusion/disorientation and was admitted with sepsis, septic brain embolization and rule out infective endocarditis. Family recently acquired a dog? Stray- 3 weeks ago.   Assessment/Plan:  Gram-positive cocci sepsis/septic brain emboli suspicious for infective endocarditis/? CAP/?? UTI - Patient was admitted to ICU. Blood cultures x2 shows gram-positive cocci in clusters. Urine culture: Pending. Influenza panel PCR negative. Chest x-ray suggests early pneumonia of left lung base with small left pleural effusion. Transthoracic echo requested and pending. Patient was started empirically on IV vancomycin, cefepime and gentamicin. Infectious disease consulted.  Acute encephalopathy - Most likely secondary to severe sepsis and septic brain emboli. Management as above. Monitor clinically. Keep n.p.o. until consistently alert and then get swallow eval before by mouth intake  Severe thrombocytopenia - Platelet counts have dropped from 100 (09/06/13) > 23 (09/09/13). This could be secondary to severe sepsis. INR normal. Fibrinogen 586. No schistocytes on peripheral smear. DIC  and HIT panel requested and pending. Hematology consulted. No overt bleeding at this time. No antiplatelets or heparin products at this time. Trend daily CBCs. Transfuse if has overt bleeding or platelet count less than 10 K. ? DIC related to sepsis or AVR but fibrinogen elevated.  Hypokalemia - Replace intravenously and follow BMP  Leukocytosis - Secondary to sepsis. Trend daily CBCs  Status post tissue aVR/LBBB - Requested transthoracic 2-D echocardiogram. Cardiology consulted. Eventually? TEE when stable.  History of thyroid cancer, post thyroidectomy hypothyroidism - Thyroid supplements. Elevated TSH may be secondary to acute illness. Recommend followup in 4-6 weeks.  Hypertension - Continue when necessary IV metoprolol. Hold lisinopril.  ? Left hemiparesis/Septic Emboli-hemorrhagic - Mx as above.  Hyperglycemia - Check hemoglobin A1c and place on SSI.  History of depression - hold Zoloft secondary to acute encephalopathy.   Code Status: Full  Family Communication: Discussed with patient spouse and son at length at bedside.  Disposition Plan: Continue ICU care.   Consultants:  Infectious disease/Dr. Carlyle Basques  Cardiology/Dr. Jenkins Rouge  Hematology/Dr. Lurline Del  Neurology: Dr. Michelene Heady  Procedures:  None  Antibiotics:  IV vancomycin 2/10 >  IV cefepime 2/10 >  IV gentamicin 2/10 >  Tamiflu 2/10 >   Subjective: Patient is confused-unable to provide history. As per family at bedside, slightly better than on admission-marked restless and more calm, answering one or 2 questions.  Objective: Filed Vitals:   09/09/13 0640 09/09/13 0700 09/09/13 0800 09/09/13 1133  BP: 126/75 126/89 124/96 114/87  Pulse: 132 130 122 115  Temp:   100.7 F (38.2 C) 97.8 F (36.6 C)  TempSrc:   Rectal Rectal  Resp: 28 34 41 45  Height:      Weight:      SpO2: 95% 95% 96% 92%    Intake/Output Summary (Last 24 hours) at 09/09/13 1350  Last data  filed at 09/09/13 1146  Gross per 24 hour  Intake 702.25 ml  Output    550 ml  Net 152.25 ml   Filed Weights   09/09/13 0337 09/09/13 0500  Weight: 90.719 kg (200 lb) 96 kg (211 lb 10.3 oz)     Exam:  General exam: Young ill-looking moderately built and nourished male lying comfortably in bed.  Respiratory system:  reduced breath sounds in the bases. Rest of lung fields clear to auscultation. Mild tachypnea. Cardiovascular system: S1 & S2 heard,  regular tachycardia. No JVD, murmurs, gallops, clicks or pedal edema.Telemetry: Sinus tachycardia in the 120s to 130s.  Gastrointestinal system: Abdomen is nondistended, soft and nontender. Normal bowel sounds heard. Central nervous system:  somnolent but easily arousable and oriented only to self. Does not follow instructions consistently.? Right gaze preference. No facial asymmetry.  Extremities:  Right extremities: At least grade 4/5 RUE & 2/5 RLE. Left extremities: Grade 2/5.   Data Reviewed: Basic Metabolic Panel:  Recent Labs Lab 09/06/13 2018 09/08/13 2200 09/09/13 0545  NA 136* 130* 134*  K 3.6* 3.1* 3.0*  CL 98 92* 98  CO2 23 22 21   GLUCOSE 163* 258* 184*  BUN 19 18 18   CREATININE 0.90 0.76 0.93  CALCIUM 9.2 8.8 8.0*   Liver Function Tests:  Recent Labs Lab 09/06/13 2018 09/08/13 2200 09/09/13 0545  AST 35 44* 48*  ALT 31 40 37  ALKPHOS 57 87 66  BILITOT 0.7 0.7 0.9  PROT 7.3 7.0 5.7*  ALBUMIN 3.6 3.0* 2.5*    Recent Labs Lab 09/06/13 2018  LIPASE 18    Recent Labs Lab 09/06/13 2018 09/08/13 2336  AMMONIA 31 39   CBC:  Recent Labs Lab 09/06/13 2018 09/08/13 2200 09/09/13 0545 09/09/13 0958  WBC 17.0* 17.9* 18.2*  --   NEUTROABS 15.4*  --   --   --   HGB 14.2 15.6 14.6  --   HCT 41.3 44.1 40.1  --   MCV 88.4 86.1 84.6  --   PLT 100* 39* 30* 23*   Cardiac Enzymes: No results found for this basename: CKTOTAL, CKMB, CKMBINDEX, TROPONINI,  in the last 168 hours BNP (last 3 results) No  results found for this basename: PROBNP,  in the last 8760 hours CBG: No results found for this basename: GLUCAP,  in the last 168 hours  Recent Results (from the past 240 hour(s))  CULTURE, BLOOD (ROUTINE X 2)     Status: None   Collection Time    09/08/13 10:00 PM      Result Value Range Status   Specimen Description BLOOD RIGHT ARM   Final   Special Requests BOTTLES DRAWN AEROBIC AND ANAEROBIC 5CC   Final   Culture  Setup Time     Final   Value: 09/09/2013 03:30     Performed at [facility]   Culture     Final   Value: GRAM POSITIVE COCCI IN CLUSTERS     Note: Gram Stain Report Called to,Read Back By and Verified With: NEELY RICHARDSON @ 1220 09/09/13 BY KRAWS     Performed at [facility]   Report Status PENDING   Incomplete  CULTURE, BLOOD (ROUTINE X 2)     Status: None   Collection Time    09/08/13 11:36 PM      Result Value Range Status   Specimen Description BLOOD RIGHT ANTECUBITAL   Final   Special Requests BOTTLES DRAWN AEROBIC AND  ANAEROBIC 5CC   Final   Culture  Setup Time     Final   Value: 09/09/2013 03:29     Performed at [facility]   Culture     Final   Value: GRAM POSITIVE COCCI IN CLUSTERS     Note: Gram Stain Report Called to,Read Back By and Verified With:  NEELY RICHARDSON @ 1220 09/09/13 BY KRAWS     Performed at [facility]   Report Status PENDING   Incomplete  MRSA PCR SCREENING     Status: None   Collection Time    09/09/13  4:44 AM      Result Value Range Status   MRSA by PCR NEGATIVE  NEGATIVE Final   Comment:            The GeneXpert MRSA Assay (FDA     approved for NASAL specimens     only), is one component of a     comprehensive MRSA colonization     surveillance program. It is not     intended to diagnose MRSA     infection nor to guide or     monitor treatment for     MRSA infections.      Studies: Ct Head Wo Contrast  09/09/2013   CLINICAL DATA:  Fever.  Altered mental status.  EXAM: CT HEAD  WITHOUT CONTRAST  TECHNIQUE: Contiguous axial images were obtained from the base of the skull through the vertex without intravenous contrast.  COMPARISON:  CT HEAD W/O CM dated 09/06/2013; DG CHEST 1V PORT dated 09/08/2013; CT ABD/PELVIS W CM dated 09/06/2013  FINDINGS: Small focus of linear high attenuation in the left parietal lobe appear similar to the prior exam. The amount of edema at has increased compared to the prior exam suggesting a subacute process. This appears wedge-shaped in may represent an embolic infarct. There is a similar faint area of high attenuation in the left frontal lobe on image number 22 low with a more subtle area in the right frontal lobe that is more equivocal. The dural venous sinuses appear within normal limits. No mass lesion or midline shift. No hydrocephalus.  The calvarium is intact. Left-sided nasal septal spur. Mastoid air cells clear. Paranasal sinuses are normal.  IMPRESSION: 1. Increasing edema around linear high attenuation in the left parietal lobe. Followup MRI with and without contrast is recommended for further assessment. The patient presents with an infectious syndrome and septic emboli are in the differential considerations. Infarct with a small amount of hemorrhage or cortical laminar necrosis as well as developmental venous anomaly remain in the differential considerations. MRI should be useful in further characterization. 2. Dr. Leonel Ramsay called discuss the case at 0230 hr on the day of examination.   Electronically Signed   By: Dereck Ligas M.D.   On: 09/09/2013 02:32   [name] [id number] Contrast  09/09/2013   CLINICAL DATA:  Post aortic valve replacement 2011. Presenting with altered mental status, elevated white count and abnormal CT.  EXAM: MRI HEAD WITHOUT AND WITH CONTRAST  TECHNIQUE: Multiplanar, multiecho pulse sequences of the brain and surrounding structures were obtained without and with intravenous contrast.  CONTRAST:  47mL MULTIHANCE GADOBENATE  DIMEGLUMINE 529 MG/ML IV SOLN  COMPARISON:  09/09/2013 and 09/06/2013 CT.  No comparison MR.  FINDINGS: Exam is motion degraded.  Multiple bilateral supra tentorial and right cerebellar regions of restricted motion some of which are partially hemorrhagic with largest area left parietal lobe. In the present  clinical setting, these findings are consistent with acute partially hemorrhagic infarcts and possibly related to septic emboli.  At most there is very minimal enhancement of the left parietal partially hemorrhagic infarct.  No intracranial mass lesion seen separate from the above described findings.  Major intracranial vascular structures are patent.  IMPRESSION: Exam is motion degraded.  Multiple bilateral supra tentorial and right cerebellar regions of restricted motion some of which are partially hemorrhagic with largest area left parietal lobe. In the present clinical setting, these findings are consistent with acute partially hemorrhagic infarcts and possibly related to septic emboli.  These results were called by telephone at the time of interpretation on 09/09/2013 at 11:54 AM to Dr. Algis Liming , who verbally acknowledged these results.   Electronically Signed   By: Chauncey Cruel M.D.   On: 09/09/2013 12:03   Dg Chest Port 1 View  09/08/2013   CLINICAL DATA:  Chest pain, hypertension  EXAM: PORTABLE CHEST - 1 VIEW  COMPARISON:  September 06, 2013  FINDINGS: The heart size and mediastinal contours are stable. Patient is status post prior CABG. The heart size is enlarged. Patchy consolidation of left lung base is identified. There is mild atelectasis of right lung base. There is no pulmonary edema. There is probable small left pleural effusion. The visualized skeletal structures are stable.  IMPRESSION: Early pneumonia of left lung base with small left pleural effusion.   Electronically Signed   By: Abelardo Diesel M.D.   On: 09/08/2013 23:41        Scheduled Meds: . sodium chloride   Intravenous STAT  .  ceFEPime (MAXIPIME) IV  2 g Intravenous Q8H  . gentamicin  1 mg/kg Intravenous Q8H  . sodium chloride  3 mL Intravenous Q12H  . vancomycin  1,000 mg Intravenous Q8H   Continuous Infusions: . sodium chloride    . 0.9 % NaCl with KCl 20 mEq / L      Principal Problem:   Sepsis Active Problems:   HYPOTHYROIDISM   Aortic valve replaced   Hypertension   Major depressive disorder, recurrent, severe with psychotic features   Hypothyroidism, postsurgical   Altered mental state   Encephalitis   Septic embolism   HCAP (healthcare-associated pneumonia)    Time spent: 51 minutes    Ebrima Ranta, MD, FACP, FHM. [facility] Pager [phone]  If 7PM-7AM, please contact night-coverage [email or web address] Password [email or web address] 09/09/2013, 1:50 PM    LOS: 1 day

## 2013-09-09 NOTE — Progress Notes (Signed)
ANTIBIOTIC CONSULT NOTE - INITIAL  Pharmacy Consult for vancomycin, gentamicin, cefepime Indication: infective endocarditis and septic emboli, prosthetic valve    No Known Allergies  Patient Measurements: Height: 6\' 1"  (185.4 cm) Weight: 200 lb (90.719 kg) IBW/kg (Calculated) : 79.9 Adjusted Body Weight:   Vital Signs: Temp: 102.2 F (39 C) (02/10 0220) Temp src: Rectal (02/10 0220) BP: 119/70 mmHg (02/10 0400) Pulse Rate: 113 (02/10 0346) Intake/Output from previous day:   Intake/Output from this shift:    Labs:  Recent Labs  09/06/13 2018 09/08/13 2200  WBC 17.0* 17.9*  HGB 14.2 15.6  PLT 100* 39*  CREATININE 0.90 0.76   Estimated Creatinine Clearance: 116.5 ml/min (by C-G formula based on Cr of 0.76). No results found for this basename: VANCOTROUGH, VANCOPEAK, VANCORANDOM, GENTTROUGH, GENTPEAK, GENTRANDOM, TOBRATROUGH, TOBRAPEAK, TOBRARND, AMIKACINPEAK, AMIKACINTROU, AMIKACIN,  in the last 72 hours   Microbiology: No results found for this or any previous visit (from the past 720 hour(s)).  Medical History: Past Medical History  Diagnosis Date  . Anxiety   . Depression   . History of thyroid cancer   . Hypertension   . Hypothyroidism, postsurgical   . Aortic stenosis     s/p Bentall with bioprosthetic AVR 02/2010; Last echo (9/11): Moderate LVH, EF 45-50%, AVR functioning appropriately, aortic valve mean gradient 21, diastolic dysfunction. Chest MRA (2/13): Mild to moderate dilatation at the sinus of Valsalva at 4.1 cm, mild dilatation ascending aorta distal to the tube graft at 3.9 cm, moderate dilatation of the innominate artery a 2.1 cm;    . Hx of cardiac cath     a. LHC in 02/2010: normal cors  . Hx of echocardiogram 2014    Echo (10/14): Severe LVH, EF 60-65%, normal wall motion, grade 1 diastolic dysfunction, AVR functioning normally, mild aortic stenosis (mean 19), moderately dilated aorta, mild LAE, mild RVE    Medications:  Anti-infectives   Start     Dose/Rate Route Frequency Ordered Stop   09/09/13 0800  vancomycin (VANCOCIN) IVPB 1000 mg/200 mL premix     1,000 mg 200 mL/hr over 60 Minutes Intravenous Every 8 hours 09/09/13 0341     09/09/13 0500  gentamicin (GARAMYCIN) 90 mg in dextrose 5 % 50 mL IVPB     1 mg/kg  90.7 kg 104.5 mL/hr over 30 Minutes Intravenous 3 times per day 09/09/13 0340     09/09/13 0400  ceFEPIme (MAXIPIME) 2 g in dextrose 5 % 50 mL IVPB     2 g 100 mL/hr over 30 Minutes Intravenous 3 times per day 09/09/13 0340     09/08/13 2315  piperacillin-tazobactam (ZOSYN) IVPB 3.375 g     3.375 g 100 mL/hr over 30 Minutes Intravenous  Once 09/08/13 2307 09/09/13 0043   09/08/13 2315  vancomycin (VANCOCIN) IVPB 1000 mg/200 mL premix     1,000 mg 200 mL/hr over 60 Minutes Intravenous  Once 09/08/13 2307 09/09/13 0230     Assessment: Patient with infective endocarditis and septic emboli, prosthetic valve.  First dose of antibiotics already given.   Goal of Therapy:  Vancomycin trough level 15-20 mcg/ml Gentamicin 1mg /kg iv q8hr (with trough <1) Cefepime dosed based on patient weight and renal function   Plan:  Measure antibiotic drug levels at steady state Follow up culture results Vancomycin 1gm iv q8hr Gentamicin 90mg  iv q8hr Cefepime 2gm iv q8hr  Bradley [name], Bradley [name] 09/09/2013,5:34 AM

## 2013-09-09 NOTE — Consult Note (Addendum)
Reason for Consult: ?AF, hx of AVR/Bentall  Requesting Physician: Mary Sella  HPI: This is a 58 y.o. male with a past medical history significant for Bentall procedure in 2011. His last echo was Oct 2014 and showed an EF of 60-65% with mild AS and NL valve function. He was seen ion the ER for "flu" 09/06/13 and sent home. He returned 09/08/13 febrile with mental status changes and leukocytosis. Blood cultures positive for GM positive cocci in clusters. CXR unremarkable. A CT of his head suggests embolic CVA with edema. Since admission his Plt count has dropped from 100K to 23K. His D dimer is 10, fibrinogen 586. He remains febrile despite antibiotics. His telemetry shows NSR, ST, LBBB. I'm not convinced its AF, will review with MD. Echo is pending.   PMHx:  Past Medical History  Diagnosis Date  . Anxiety   . Depression   . History of thyroid cancer   . Hypertension   . Hypothyroidism, postsurgical   . Aortic stenosis     s/p Bentall with bioprosthetic AVR 02/2010; Last echo (9/11): Moderate LVH, EF 45-50%, AVR functioning appropriately, aortic valve mean gradient 21, diastolic dysfunction. Chest MRA (2/13): Mild to moderate dilatation at the sinus of Valsalva at 4.1 cm, mild dilatation ascending aorta distal to the tube graft at 3.9 cm, moderate dilatation of the innominate artery a 2.1 cm;    . Hx of cardiac cath     a. LHC in 02/2010: normal cors  . Hx of echocardiogram 2014    Echo (10/14): Severe LVH, EF 60-65%, normal wall motion, grade 1 diastolic dysfunction, AVR functioning normally, mild aortic stenosis (mean 19), moderately dilated aorta, mild LAE, mild RVE   Past Surgical History  Procedure Laterality Date  . Cardiac catheterization  03/02/2010    NORMAL CORONARY ARTERY  . Sternotomy      REDO  . Transthoracic echocardiogram  03/2010    SHOWED MILD REDUCTION OF LV FUNCTION  . Thyroidectomy    . Rotator cuff repair  2012    Right    FAMHx: CAD, father   SOCHx:  reports that  he has never smoked. He has never used smokeless tobacco. He reports that he does not drink alcohol or use illicit drugs.  ALLERGIES: No Known Allergies  ROS: Review of systems not obtained due to patient factors.  HOME MEDICATIONS: Prescriptions prior to admission  Medication Sig Dispense Refill  . acetaminophen (TYLENOL) 500 MG tablet Take 500 mg by mouth every 6 (six) hours as needed for mild pain or fever.      Marland Kitchen aspirin 325 MG tablet Take 325 mg by mouth daily.        Marland Kitchen ibuprofen (ADVIL,MOTRIN) 200 MG tablet Take 200 mg by mouth every 6 (six) hours as needed for fever.      . levothyroxine (SYNTHROID, LEVOTHROID) 175 MCG tablet Take 175 mcg by mouth daily.      Marland Kitchen lisinopril (PRINIVIL,ZESTRIL) 10 MG tablet Take 1 tablet (10 mg total) by mouth daily.  30 tablet  11  . metoprolol (LOPRESSOR) 50 MG tablet Take 25 mg by mouth 2 (two) times daily.      . multivitamin (THERAGRAN) per tablet Take 1 tablet by mouth daily.        Marland Kitchen oseltamivir (TAMIFLU) 75 MG capsule Take 1 capsule (75 mg total) by mouth every 12 (twelve) hours.  10 capsule  0  . sertraline (ZOLOFT) 50 MG tablet Take 50 mg by mouth daily.  HOSPITAL MEDICATIONS: I have reviewed the patient's current medications.  VITALS: Blood pressure 114/87, pulse 115, temperature 97.8 F (36.6 C), temperature source Rectal, resp. rate 45, height '6\' 1"'  (1.854 m), weight 211 lb 10.3 oz (96 kg), SpO2 92.00%.  PHYSICAL EXAM: General appearance:patient flushed  Minimal following of commands   Neck: no carotid bruit and no JVD Lungs: decreased breath sounds at bases   Heart: regular rythm, increased rate, no obvious murmur Abdomen: soft, non-tender; bowel sounds normal; no masses,  no organomegaly Extremities: extremities normal, atraumatic, no cyanosis or edema Pulses: 2+ and symmetric Skin: warm, hot, dry  Splinter hemorrhages on finger R hand  L foot  Blisters on dorsum of L foot.  Neurologic: Lethargic, awake but not really  following commands.   LABS: Results for orders placed during the hospital encounter of 09/08/13 (from the past 48 hour(s))  CBC     Status: Abnormal   Collection Time    09/08/13 10:00 PM      Result Value Range   WBC 17.9 (*) 4.0 - 10.5 K/uL   RBC 5.12  4.22 - 5.81 MIL/uL   Hemoglobin 15.6  13.0 - 17.0 g/dL   HCT 44.1  39.0 - 52.0 %   MCV 86.1  78.0 - 100.0 fL   MCH 30.5  26.0 - 34.0 pg   MCHC 35.4  30.0 - 36.0 g/dL   RDW 14.0  11.5 - 15.5 %   Platelets 39 (*) 150 - 400 K/uL   Comment: REPEATED TO VERIFY     SPECIMEN CHECKED FOR CLOTS     PLATELET COUNT CONFIRMED BY SMEAR  COMPREHENSIVE METABOLIC PANEL     Status: Abnormal   Collection Time    09/08/13 10:00 PM      Result Value Range   Sodium 130 (*) 137 - 147 mEq/L   Potassium 3.1 (*) 3.7 - 5.3 mEq/L   Chloride 92 (*) 96 - 112 mEq/L   CO2 22  19 - 32 mEq/L   Glucose, Bld 258 (*) 70 - 99 mg/dL   BUN 18  6 - 23 mg/dL   Creatinine, Ser 0.76  0.50 - 1.35 mg/dL   Calcium 8.8  8.4 - 10.5 mg/dL   Total Protein 7.0  6.0 - 8.3 g/dL   Albumin 3.0 (*) 3.5 - 5.2 g/dL   AST 44 (*) 0 - 37 U/L   ALT 40  0 - 53 U/L   Alkaline Phosphatase 87  39 - 117 U/L   Total Bilirubin 0.7  0.3 - 1.2 mg/dL   GFR calc non Af Amer >90  >90 mL/min   GFR calc Af Amer >90  >90 mL/min   Comment: (NOTE)     The eGFR has been calculated using the CKD EPI equation.     This calculation has not been validated in all clinical situations.     eGFR's persistently <90 mL/min signify possible Chronic Kidney     Disease.  CULTURE, BLOOD (ROUTINE X 2)     Status: None   Collection Time    09/08/13 10:00 PM      Result Value Range   Specimen Description BLOOD RIGHT ARM     Special Requests BOTTLES DRAWN AEROBIC AND ANAEROBIC 5CC     Culture  Setup Time       Value: 09/09/2013 03:30     Performed at [facility]   Culture       Value: [place]  Note: Gram Stain Report Called to,Read Back By and Verified With: NEELY  RICHARDSON @ 1220 09/09/13 BY KRAWS     Performed at [facility]   Report Status PENDING    URINALYSIS, ROUTINE W REFLEX MICROSCOPIC     Status: Abnormal   Collection Time    09/08/13 10:03 PM      Result Value Range   Color, Urine YELLOW  YELLOW   APPearance CLOUDY (*) CLEAR   Specific Gravity, Urine 1.021  1.005 - 1.030   pH 6.0  5.0 - 8.0   Glucose, UA 100 (*) NEGATIVE mg/dL   Hgb urine dipstick LARGE (*) NEGATIVE   Bilirubin Urine NEGATIVE  NEGATIVE   Ketones, ur NEGATIVE  NEGATIVE mg/dL   Protein, ur 100 (*) NEGATIVE mg/dL   Urobilinogen, UA 1.0  0.0 - 1.0 mg/dL   Nitrite NEGATIVE  NEGATIVE   Leukocytes, UA SMALL (*) NEGATIVE  URINE MICROSCOPIC-ADD ON     Status: Abnormal   Collection Time    09/08/13 10:03 PM      Result Value Range   WBC, UA 7-10  <3 WBC/hpf   RBC / HPF 21-50  <3 RBC/hpf   Bacteria, UA MANY (*) RARE   Casts GRANULAR CAST (*) NEGATIVE  AMMONIA     Status: None   Collection Time    09/08/13 11:36 PM      Result Value Range   Ammonia 39  11 - 60 umol/L  CULTURE, BLOOD (ROUTINE X 2)     Status: None   Collection Time    09/08/13 11:36 PM      Result Value Range   Specimen Description BLOOD RIGHT ANTECUBITAL     Special Requests BOTTLES DRAWN AEROBIC AND ANAEROBIC 5CC     Culture  Setup Time       Value: 09/09/2013 03:29     Performed at [facility]   Culture       Value: GRAM POSITIVE COCCI IN CLUSTERS     Note: Gram Stain Report Called to,Read Back By and Verified With:  NEELY RICHARDSON @ 1220 09/09/13 BY KRAWS     Performed at [facility]   Report Status PENDING    CG4 I-STAT (LACTIC ACID)     Status: Abnormal   Collection Time    09/08/13 11:45 PM      Result Value Range   Lactic Acid, Venous 4.16 (*) 0.5 - 2.2 mmol/L  MRSA PCR SCREENING     Status: None   Collection Time    09/09/13  4:44 AM      Result Value Range   MRSA by PCR NEGATIVE  NEGATIVE   Comment:            The GeneXpert MRSA Assay (FDA      approved for NASAL specimens     only), is one component of a     comprehensive MRSA colonization     surveillance program. It is not     intended to diagnose MRSA     infection nor to guide or     monitor treatment for     MRSA infections.  COMPREHENSIVE METABOLIC PANEL     Status: Abnormal   Collection Time    09/09/13  5:45 AM      Result Value Range   Sodium 134 (*) 137 - 147 mEq/L   Potassium 3.0 (*) 3.7 - 5.3 mEq/L   Chloride 98  96 - 112 mEq/L  CO2 21  19 - 32 mEq/L   Glucose, Bld 184 (*) 70 - 99 mg/dL   BUN 18  6 - 23 mg/dL   Creatinine, Ser 0.93  0.50 - 1.35 mg/dL   Calcium 8.0 (*) 8.4 - 10.5 mg/dL   Total Protein 5.7 (*) 6.0 - 8.3 g/dL   Albumin 2.5 (*) 3.5 - 5.2 g/dL   AST 48 (*) 0 - 37 U/L   ALT 37  0 - 53 U/L   Alkaline Phosphatase 66  39 - 117 U/L   Total Bilirubin 0.9  0.3 - 1.2 mg/dL   GFR calc non Af Amer >90  >90 mL/min   GFR calc Af Amer >90  >90 mL/min   Comment: (NOTE)     The eGFR has been calculated using the CKD EPI equation.     This calculation has not been validated in all clinical situations.     eGFR's persistently <90 mL/min signify possible Chronic Kidney     Disease.  CBC     Status: Abnormal   Collection Time    09/09/13  5:45 AM      Result Value Range   WBC 18.2 (*) 4.0 - 10.5 K/uL   Comment: WHITE COUNT CONFIRMED ON SMEAR   RBC 4.74  4.22 - 5.81 MIL/uL   Hemoglobin 14.6  13.0 - 17.0 g/dL   HCT 40.1  39.0 - 52.0 %   MCV 84.6  78.0 - 100.0 fL   MCH 30.8  26.0 - 34.0 pg   MCHC 36.4 (*) 30.0 - 36.0 g/dL   RDW 14.0  11.5 - 15.5 %   Platelets 30 (*) 150 - 400 K/uL   Comment: PLATELET COUNT CONFIRMED BY SMEAR     SPECIMEN CHECKED FOR CLOTS  PROTIME-INR     Status: None   Collection Time    09/09/13  5:45 AM      Result Value Range   Prothrombin Time 12.8  11.6 - 15.2 seconds   INR 0.98  0.00 - 1.49  TSH     Status: Abnormal   Collection Time    09/09/13  5:45 AM      Result Value Range   TSH 7.675 (*) 0.350 - 4.500 uIU/mL    Comment: Performed at [place] PCR (TYPE A & B, H1N1)     Status: None   Collection Time    09/09/13  9:17 AM      Result Value Range   Influenza A By PCR NEGATIVE  NEGATIVE   Influenza B By PCR NEGATIVE  NEGATIVE   H1N1 flu by pcr NOT DETECTED  NOT DETECTED   Comment:            The Xpert Flu assay (FDA approved for     nasal aspirates or washes and     nasopharyngeal swab specimens), is     intended as an aid in the diagnosis of     influenza and should not be used as     a sole basis for treatment.     Performed at [facility]  DIC (DISSEMINATED INTRAVASCULAR COAGULATION) PANEL     Status: Abnormal   Collection Time    09/09/13  9:58 AM      Result Value Range   Prothrombin Time 12.2  11.6 - 15.2 seconds   INR 0.92  0.00 - 1.49   aPTT 29  24 - 37 seconds   Fibrinogen 586 (*)  204 - 475 mg/dL   D-Dimer, Quant 10.41 (*) 0.00 - 0.48 ug/mL-FEU   Comment:            AT THE INHOUSE ESTABLISHED CUTOFF     VALUE OF 0.48 ug/mL FEU,     THIS ASSAY HAS BEEN DOCUMENTED     IN THE LITERATURE TO HAVE     A SENSITIVITY AND NEGATIVE     PREDICTIVE VALUE OF AT LEAST     98 TO 99%.  THE TEST RESULT     SHOULD BE CORRELATED WITH     AN ASSESSMENT OF THE CLINICAL     PROBABILITY OF DVT / VTE.   Platelets 23 (*) 150 - 400 K/uL   Comment: SPECIMEN CHECKED FOR CLOTS     REPEATED TO VERIFY     DELTA CHECK NOTED     PLATELET COUNT CONFIRMED BY SMEAR     CRITICAL RESULT CALLED TO, READ BACK BY AND VERIFIED WITH:     R. JOHNSON RN AT 3016 ON 02.10.15 BY SHUEA   Smear Review NO SCHISTOCYTES SEEN    SAVE SMEAR     Status: None   Collection Time    09/09/13 10:59 AM      Result Value Range   Smear Review SMEAR STAINED AND AVAILABLE FOR REVIEW      EKG:2/9  9:43  SR 83 bpm  Nonspecific ST T wave changes. 2/9:  ST 110 bpm.  ST T wave changes, consider ischemia.   IMAGING: Ct Head Wo Contrast  09/09/2013   CLINICAL DATA:  Fever.  Altered mental status.   EXAM: CT HEAD WITHOUT CONTRAST  TECHNIQUE:   IMPRESSION: 1. Increasing edema around linear high attenuation in the left parietal lobe. Followup MRI with and without contrast is recommended for further assessment. The patient presents with an infectious syndrome and septic emboli are in the differential considerations. Infarct with a small amount of hemorrhage or cortical laminar necrosis as well as developmental venous anomaly remain in the differential considerations. MRI should be useful in further characterization. 2. Dr. Leonel Ramsay called discuss the case at 0230 hr on the day of examination.   Electronically Signed   By: Dereck Ligas M.D.   On: 09/09/2013 02:32   [name] WF Contrast  09/09/2013   CLINICAL DATA:  Post aortic valve replacement 2011. Presenting with altered mental status, elevated white count and abnormal CT.  EXAM: MRI HEAD WITHOUT AND WITH CONTRAST  TECHNIQUE: IMPRESSION: Exam is motion degraded.  Multiple bilateral supra tentorial and right cerebellar regions of restricted motion some of which are partially hemorrhagic with largest area left parietal lobe. In the present clinical setting, these findings are consistent with acute partially hemorrhagic infarcts and possibly related to septic emboli.  These results were called by telephone at the time of interpretation on 09/09/2013 at 11:54 AM to Dr. Algis Liming , who verbally acknowledged these results.   Electronically Signed   By: Chauncey Cruel M.D.   On: 09/09/2013 12:03     IMPRESSION: Principal Problem:   Sepsis Active Problems:   Altered mental state   Encephalitis   Septic embolism   HCAP (healthcare-associated pneumonia)   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   HYPOTHYROIDISM- TSH 7.5   Aortic valve replaced- Bentall propceedure 2011   Hypertension   Depression   LBBB (left bundle branch block)   Major depressive disorder, recurrent, severe with psychotic features- hospitalized in Jan 2013   Generalized anxiety  disorder   RECOMMENDATION:  Pt is critically ill with sepsis, presumed endocarditis with septic emboli, and CVA.  He will need a TEE at some point. ID has been consulted. Hematology and Neurology service following. TTE Echo has been done, MD to see.   Time Spent Directly with Patient: 45 minutes  Erlene Quan [phone] beeper 09/09/2013, 2:02 PM  pateint seen and examined  Agree with findings as noted by L Kilroy. Unfort 58 yo who presents who is bacteremic with evid for septic emboli  Suspect endocarditis  TTE is not adequate  There was no AI on echo.  LVEF was 45 to 50%  Mild MR Would require TEE to define  This can only be done when thromobocytopenia improves (increased risk for esophageal bleeding)  I would recheck in AM COntniue ABX  Could tx platelets but it would treat with ABX for now.  I do not think this would change immediate course.   Continue telemetry Neuro following patient     Will continue to follow    Dorris Carnes

## 2013-09-10 DIAGNOSIS — I10 Essential (primary) hypertension: Secondary | ICD-10-CM

## 2013-09-10 DIAGNOSIS — F3289 Other specified depressive episodes: Secondary | ICD-10-CM

## 2013-09-10 DIAGNOSIS — I447 Left bundle-branch block, unspecified: Secondary | ICD-10-CM

## 2013-09-10 DIAGNOSIS — N390 Urinary tract infection, site not specified: Secondary | ICD-10-CM

## 2013-09-10 DIAGNOSIS — B9689 Other specified bacterial agents as the cause of diseases classified elsewhere: Secondary | ICD-10-CM

## 2013-09-10 DIAGNOSIS — R7881 Bacteremia: Secondary | ICD-10-CM

## 2013-09-10 DIAGNOSIS — E890 Postprocedural hypothyroidism: Secondary | ICD-10-CM

## 2013-09-10 DIAGNOSIS — I5043 Acute on chronic combined systolic (congestive) and diastolic (congestive) heart failure: Secondary | ICD-10-CM

## 2013-09-10 DIAGNOSIS — I509 Heart failure, unspecified: Secondary | ICD-10-CM

## 2013-09-10 DIAGNOSIS — F329 Major depressive disorder, single episode, unspecified: Secondary | ICD-10-CM

## 2013-09-10 LAB — DIFFERENTIAL
Basophils Absolute: 0 10*3/uL (ref 0.0–0.1)
Basophils Relative: 0 % (ref 0–1)
Eosinophils Absolute: 0 10*3/uL (ref 0.0–0.7)
Eosinophils Relative: 0 % (ref 0–5)
Lymphocytes Relative: 2 % — ABNORMAL LOW (ref 12–46)
Lymphs Abs: 0.4 10*3/uL — ABNORMAL LOW (ref 0.7–4.0)
Monocytes Absolute: 3 10*3/uL — ABNORMAL HIGH (ref 0.1–1.0)
Monocytes Relative: 14 % — ABNORMAL HIGH (ref 3–12)
Neutro Abs: 17.9 10*3/uL — ABNORMAL HIGH (ref 1.7–7.7)
Neutrophils Relative %: 84 % — ABNORMAL HIGH (ref 43–77)

## 2013-09-10 LAB — CBC WITH DIFFERENTIAL/PLATELET

## 2013-09-10 LAB — COMPREHENSIVE METABOLIC PANEL
ALT: 39 U/L (ref 0–53)
AST: 41 U/L — ABNORMAL HIGH (ref 0–37)
Albumin: 2.1 g/dL — ABNORMAL LOW (ref 3.5–5.2)
Alkaline Phosphatase: 66 U/L (ref 39–117)
BUN: 23 mg/dL (ref 6–23)
CO2: 23 mEq/L (ref 19–32)
Calcium: 8 mg/dL — ABNORMAL LOW (ref 8.4–10.5)
Chloride: 103 mEq/L (ref 96–112)
Creatinine, Ser: 1 mg/dL (ref 0.50–1.35)
GFR calc Af Amer: >90 mL/min (ref >90)
GFR calc non Af Amer: 82 mL/min — ABNORMAL LOW (ref >90)
Glucose, Bld: 157 mg/dL — ABNORMAL HIGH (ref 70–99)
Potassium: 3.8 mEq/L (ref 3.7–5.3)
Sodium: 138 mEq/L (ref 137–147)
Total Bilirubin: 1 mg/dL (ref 0.3–1.2)
Total Protein: 5.4 g/dL — ABNORMAL LOW (ref 6.0–8.3)

## 2013-09-10 LAB — CBC
HCT: 41.2 % (ref 39.0–52.0)
Hemoglobin: 14.9 g/dL (ref 13.0–17.0)
MCH: 30.7 pg (ref 26.0–34.0)
MCHC: 36.2 g/dL — ABNORMAL HIGH (ref 30.0–36.0)
MCV: 84.9 fL (ref 78.0–100.0)
Platelets: 36 10*3/uL — ABNORMAL LOW (ref 150–400)
RBC: 4.85 MIL/uL (ref 4.22–5.81)
RDW: 14.3 % (ref 11.5–15.5)
WBC: 21.3 10*3/uL — ABNORMAL HIGH (ref 4.0–10.5)

## 2013-09-10 LAB — GLUCOSE, CAPILLARY
Glucose-Capillary: 141 mg/dL — ABNORMAL HIGH (ref 70–99)
Glucose-Capillary: 147 mg/dL — ABNORMAL HIGH (ref 70–99)
Glucose-Capillary: 148 mg/dL — ABNORMAL HIGH (ref 70–99)
Glucose-Capillary: 132 mg/dL — ABNORMAL HIGH (ref 70–99)
Glucose-Capillary: 168 mg/dL — ABNORMAL HIGH (ref 70–99)
Glucose-Capillary: 139 mg/dL — ABNORMAL HIGH (ref 70–99)

## 2013-09-10 LAB — DIC (DISSEMINATED INTRAVASCULAR COAGULATION)PANEL
D-Dimer, Quant: 11.85 ug/mL-FEU — ABNORMAL HIGH (ref 0.00–0.48)
Fibrinogen: 697 mg/dL — ABNORMAL HIGH (ref 204–475)
INR: 1.1 (ref 0.00–1.49)
Platelets: 36 10*3/uL — ABNORMAL LOW (ref 150–400)
Prothrombin Time: 14 seconds (ref 11.6–15.2)
Smear Review: NONE SEEN
aPTT: 25 seconds (ref 24–37)

## 2013-09-10 LAB — TROPONIN I
Troponin I: 2.26 ng/mL (ref <0.30)
Troponin I: 2.05 ng/mL (ref <0.30)
Troponin I: 1.78 ng/mL (ref <0.30)

## 2013-09-10 LAB — VANCOMYCIN, TROUGH: Vancomycin Tr: 18.7 ug/mL (ref 10.0–20.0)

## 2013-09-10 LAB — LACTIC ACID, PLASMA: Lactic Acid, Venous: 1.3 mmol/L (ref 0.5–2.2)

## 2013-09-10 LAB — PATHOLOGIST SMEAR REVIEW

## 2013-09-10 LAB — PRO B NATRIURETIC PEPTIDE: Pro B Natriuretic peptide (BNP): 37349 pg/mL — ABNORMAL HIGH (ref 0–125)

## 2013-09-10 LAB — MAGNESIUM: Magnesium: 2.2 mg/dL (ref 1.5–2.5)

## 2013-09-10 MED ORDER — SODIUM CHLORIDE 0.9 % IV SOLN
INTRAVENOUS | Status: DC
  Administered 2013-09-10 – 2013-09-12 (×3): via INTRAVENOUS

## 2013-09-10 MED ORDER — SODIUM CHLORIDE 0.9 % IV BOLUS (SEPSIS)
1000.0000 mL | Freq: Once | INTRAVENOUS | Status: AC
  Administered 2013-09-10: 1000 mL via INTRAVENOUS

## 2013-09-10 MED ORDER — LORAZEPAM 2 MG/ML IJ SOLN
1.0000 mg | Freq: Once | INTRAMUSCULAR | Status: AC
  Administered 2013-09-10: 1 mg via INTRAVENOUS
  Filled 2013-09-10: qty 1

## 2013-09-10 MED ORDER — INFLUENZA VAC SPLIT QUAD 0.5 ML IM SUSP
0.5000 mL | INTRAMUSCULAR | Status: AC
  Administered 2013-09-11: 0.5 mL via INTRAMUSCULAR
  Filled 2013-09-10 (×2): qty 0.5

## 2013-09-10 NOTE — Evaluation (Signed)
Clinical/Bedside Swallow Evaluation Patient Details  Name: Bradley [name] MRN: [id number] Date of Birth: 1955-09-22  Today's Date: 09/10/2013 Time: 1200-1230 SLP Time Calculation (min): 30 min  Past Medical History:  Past Medical History  Diagnosis Date  . Anxiety   . Depression   . History of thyroid cancer   . Hypertension   . Hypothyroidism, postsurgical   . Aortic stenosis     s/p Bentall with bioprosthetic AVR 02/2010; Last echo (9/11): Moderate LVH, EF 45-50%, AVR functioning appropriately, aortic valve mean gradient 21, diastolic dysfunction. Chest MRA (2/13): Mild to moderate dilatation at the sinus of Valsalva at 4.1 cm, mild dilatation ascending aorta distal to the tube graft at 3.9 cm, moderate dilatation of the innominate artery a 2.1 cm;    . Hx of cardiac cath     a. LHC in 02/2010: normal cors  . Hx of echocardiogram 2014    Echo (10/14): Severe LVH, EF 60-65%, normal wall motion, grade 1 diastolic dysfunction, AVR functioning normally, mild aortic stenosis (mean 19), moderately dilated aorta, mild LAE, mild RVE   Past Surgical History:  Past Surgical History  Procedure Laterality Date  . Cardiac catheterization  03/02/2010    NORMAL CORONARY ARTERY  . Sternotomy      REDO  . Transthoracic echocardiogram  03/2010    SHOWED MILD REDUCTION OF LV FUNCTION  . Thyroidectomy    . Rotator cuff repair  2012    Right   HPI:  58 yo male adm to [facility] with sepsis - suspicious for infective endocarditis with septic brain embolization, severe thrombocytopenia and encephalopathy.   Pt found to have had history of cvas per imaging studies but daughter stated she was not aware of previous cvas until this admit.  Pt with early left lung base pna and has active MI.  Md ordered BSE of swallow.     Assessment / Plan / Recommendation Clinical Impression  Pt's ability to consume po currently mostly compromised by his lethargy- as he is able to maintain LOA for approximately 5 seconds  maximum.  No significant cranial nerve deficits apparent that impact swallow musculature.  Mild amount of viscous oral secretions retained in posterior oral cavity - cleared with oral care by SLP.  He accepted a few single ice chips, small tsp of water and 2 bites of applesauce. Mildly delayed swallow intiation noted with sublte throat clearing after swallow.    Rec pt remain npo except small single ice chips when fully alert.  Clarise Cruz (pt's daughter) in room , verbalized understanding to information provided and is in agreement with recommendations.    SLP will follow for readiness for po and family/pt education.  Demonstrated use of oral suction to daughter and educated her to aspiration precautions.        Aspiration Risk    Severe due to lethargy   Diet Recommendation NPO (except single ice chips)        Other  Recommendations Oral Care Recommendations: Oral care before and after PO   Follow Up Recommendations   (TBD)    Frequency and Duration min 2x/week  2 weeks   Pertinent Vitals/Pain Febrile, decreased      Swallow Study Prior Functional Status    no dysphagia prior to admission per pt    General Date of Onset: 09/08/13 HPI: 58 yo male adm to [facility] with sepsis - suspicious for infective endocarditis with septic brain embolization, severe thrombocytopenia and encephalopathy.   Pt found to have had history  of cvas per imaging studies but daughter stated she was not aware of previous cvas until this admit.  Pt with early left lung base pna and has active MI.  Md ordered BSE of swallow.   Type of Study: Bedside swallow evaluation Diet Prior to this Study: NPO Temperature Spikes Noted: Yes Respiratory Status: Nasal cannula History of Recent Intubation: No Behavior/Cognition: Lethargic;Distractible;Requires cueing;Doesn't follow directions;Decreased sustained attention Oral Cavity - Dentition: Adequate natural dentition Self-Feeding Abilities: Total assist Patient Positioning:  Upright in bed Baseline Vocal Quality: Clear Volitional Cough: Strong Volitional Swallow: Able to elicit    Oral/Motor/Sensory Function Overall Oral Motor/Sensory Function: Appears within functional limits for tasks assessed (? mild left facial droop, pt did not open mouth fully to view, generalized weakness)   Ice Chips Ice chips: Impaired Presentation: Spoon Oral Phase Impairments: Impaired anterior to posterior transit;Reduced lingual movement/coordination Oral Phase Functional Implications: Prolonged oral transit Pharyngeal Phase Impairments: Suspected delayed Swallow   Thin Liquid Thin Liquid: Impaired Presentation: Spoon Oral Phase Impairments: Reduced lingual movement/coordination;Impaired anterior to posterior transit Oral Phase Functional Implications: Prolonged oral transit Pharyngeal  Phase Impairments: Suspected delayed Swallow;Throat Clearing - Delayed    Nectar Thick Nectar Thick Liquid: Not tested   Honey Thick Honey Thick Liquid: Not tested   Puree Puree: Impaired Presentation: Spoon Oral Phase Impairments: Reduced lingual movement/coordination;Impaired anterior to posterior transit Oral Phase Functional Implications: Prolonged oral transit;Oral residue Pharyngeal Phase Impairments: Suspected delayed Swallow;Throat Clearing - Delayed   Solid   GO    Solid: Not tested       Luanna Salk, [facility] SLP [phone]

## 2013-09-10 NOTE — Progress Notes (Signed)
Pharmacy: Brief Vancomycin note  Vancomycin trough 18.7 on vancomycin 1 gram IV q8h staph bacteremia and r/o IE.  Scr trending up slightly (0.7 --> 0.9 -- > 1).    Plan 1.) Continue Vancomycin 1 gram IV q8h 2.) BMET in AM, watch Scr trend 3.) Repeat VT to monitor for accumulation of drug on q8h dosing  Glee Arvin PharmD Pager #: [phone] 6:09 PM 09/10/2013

## 2013-09-10 NOTE — Progress Notes (Signed)
        Patient Name: Bradley [name] Date of Encounter: 09/10/2013    SUBJECTIVE: He is resting comfortably in bed. He is difficult to arouse.  TELEMETRY:  Sinus tachycardia at 110 beats per min Filed Vitals:   09/09/13 2000 09/10/13 0213 09/10/13 0400 09/10/13 0500  BP: 119/73  146/106 123/99  Pulse: 125 120 115 112  Temp:      TempSrc:      Resp: 42  32 28  Height:      Weight:      SpO2: 91% 94% 93% 96%    Intake/Output Summary (Last 24 hours) at 09/10/13 0656 Last data filed at 09/10/13 0523  Gross per 24 hour  Intake 2650.5 ml  Output   1450 ml  Net 1200.5 ml    LABS: Basic Metabolic Panel:  Recent Labs  09/09/13 0545 09/10/13 0320  NA 134* 138  K 3.0* 3.8  CL 98 103  CO2 21 23  GLUCOSE 184* 157*  BUN 18 23  CREATININE 0.93 1.00  CALCIUM 8.0* 8.0*   CBC:  Recent Labs  09/09/13 0545 09/09/13 0958 09/10/13 0320  WBC 18.2*  --  21.3*  HGB 14.6  --  14.9  HCT 40.1  --  41.2  MCV 84.6  --  84.9  PLT 30* 23* 36*  36*   Hemoglobin A1C:  Recent Labs  09/09/13 0545  HGBA1C 5.7*   Fasting Lipid Panel: No results found for this basename: CHOL, HDL, LDLCALC, TRIG, CHOLHDL, LDLDIRECT,  in the last 72 hours  Radiology/Studies:  Known her data  Physical Exam: Blood pressure 123/99, pulse 112, temperature 101.7 F (38.7 C), temperature source Rectal, resp. rate 28, height 6\' 1"  (1.854 m), weight 211 lb 10.3 oz (96 kg), SpO2 96.00%. Weight change:    2 of 6 systolic murmur at the right upper sternal border. A summation gallop is heard at the apex. I do not hear a diastolic murmur  The patient is difficult to arouse  ASSESSMENT:  1. Suspect endocarditis in this patient with a prior Bentall procedure with bioprosthetic aortic valve 2. Severe thrombocytopenia, this morning at 36,000. Leukocytosis has increased 3. Hypotension and tachycardia are improving.  Plan:  Agree with broad-spectrum antibiotics, hemodynamic support, and we will he  eventually schedule a transesophageal echo once safe. Perhaps this can be done later this week.  Demetrios Isaacs 09/10/2013, 6:56 AM

## 2013-09-10 NOTE — Progress Notes (Signed)
ANTIBIOTIC CONSULT NOTE - FOLLOW UP  Pharmacy Consult for Vancomycin, Gentamicin Indication: GPC bacteremia, r/o IE, UTI  No Known Allergies  Patient Measurements: Height: 6\' 1"  (185.4 cm) Weight: 211 lb 10.3 oz (96 kg) IBW/kg (Calculated) : 79.9  Vital Signs: Temp: 100.5 F (38.1 C) (02/11 0900) Temp src: Axillary (02/11 0400) BP: 128/93 mmHg (02/11 0900) Pulse Rate: 108 (02/11 0900) Intake/Output from previous day: 02/10 0701 - 02/11 0700 In: 2775.5 [I.V.:1568.8; IV Piggyback:1206.8] Out: 1450 [Urine:1450] Intake/Output from this shift:    Labs:  Recent Labs  09/08/13 2200 09/09/13 0545 09/09/13 0958 09/10/13 0320  WBC 17.9* 18.2*  --  21.3*  HGB 15.6 14.6  --  14.9  PLT 39* 30* 23* 36*  36*  CREATININE 0.76 0.93  --  1.00   Estimated Creatinine Clearance: 100.7 ml/min (by C-G formula based on Cr of 1). No results found for this basename: Letta Median, [place], GENTTROUGH, GENTPEAK, GENTRANDOM, TOBRATROUGH, TOBRAPEAK, TOBRARND, AMIKACINPEAK, AMIKACINTROU, AMIKACIN,  in the last 72 hours   Microbiology: 2/9 blood x 2: 2/2 GPC in clusters 2/9 urine: staph aureus   Assessment: 35 yoM with history of aortic stenosis status post bioprosthetic valve who presents with AMS and abnormal head CT.  Found with WBC 18.2, fever, and CXR concern for PNA.  Concern for infective endocarditis and septic emboli in pt with prosthetic valve.  Blood cultures growing GPC in clusters.  Urine growing staph aureus.  ID consulted.  Cardiology consulted.  Day #2 Vancomycin 1g IV q8h, Nafcillin 2g IV q4h, and Gentamicin 90 mg IV q8h (for synergy).  Patient remains febrile, WBC increasing (21.3).  Renal function appears stable.  SCr 1.0, CrCl~83 ml/min (normalized), ~100 ml/min (CG)  TTE ECHO did not show vegetation.  Plan is for TEE when able.  Goal of Therapy:  Vancomycin trough level 15-20 mcg/ml Gentamicin peak 3-4 mg/L and trough <1 mg/L (synergy goals)  Plan:   1.  Vancomycin trough this afternoon. 2.  Gentamicin trough tomorrow morning. 3.  F/u culture results to guide antibiotics.  Bradley [name] 09/10/2013,9:40 AM

## 2013-09-10 NOTE — Progress Notes (Signed)
[facility] PROGRESS NOTE  MAN MOM [id number] DOB: 07/11/1956 DOA: 09/08/2013 PCP: [name], MD  Assessment/Plan:  Gram-positive cocci sepsis/septic brain emboli suspicious for infective endocarditis/? CAP/?? - Patient was admitted to ICU. Blood cultures x2 shows gram-positive cocci in clusters.  -. Influenza panel PCR negative.  -Chest x-ray suggests early pneumonia of left lung base with small left pleural effusion.  -Transthoracic echo requested and pending.  -Continue IV vancomycin,  nafcillin and gentamicin.  -Infectious disease consulted. - Restart NS bolus 1 L then  @ 125 ml/hr  UTI -Urine culture: Positive Staph Aureus susceptibilities pending - continue ABx    Acute encephalopathy  - Most likely secondary to severe sepsis and septic brain emboli.  -Continue antibiotics  -Patient more awake today will order bedside swallow    Severe thrombocytopenia  - Platelet counts have dropped from 100 (09/06/13) > 23 (09/09/13); currently 36. Most likely secondary to severe sepsis. INR normal. Fibrinogen 586.  -No schistocytes on peripheral smear.  -DIC panel positive  - HIT panel pending requested and pending.  -Hematology consulted.  -No overt bleeding at this time.  -No antiplatelets or heparin products at this time.  -Trend daily CBCs.  -Transfuse if has overt bleeding or platelet count less than 10 K.   Hypokalemia  - Replace intravenously and follow BMP   Leukocytosis  - Secondary to sepsis. Trend daily CBCs   Active MI  - 2/11 Troponin =2.26 -Trend Troponin    Systolic and Diastolic CHF - Acute vs Chronic ?  Status post tissue aVR/LBBB  - Requested transthoracic 2-D echocardiogram.TEE when stable. - Cardiology consulted. Eventually?    History of thyroid cancer, post thyroidectomy hypothyroidism  - Thyroid supplements. Elevated TSH may be secondary to acute illness. - - Workup for Sick U Thyroid Syn -  Recommend followup in 4-6 weeks.    Hypertension  - Continue when necessary IV metoprolol. Hold lisinopril.   ? Left hemiparesis/Septic Emboli-hemorrhagic  - Mx as above.   Hyperglycemia  - Hemoglobin A1c = 0.7  -  SSI.   History of depression  - hold Zoloft secondary to acute encephalopathy.    Code Status: Full Family Communication: Daughter present for discussion of plan of care Disposition Plan: Resolution of sepsis,   Consultants: Dr Kathrynn Speed (Neurology)  Dr Lurline Del (Oncology) Dr Dorris Carnes (Cardiology)    Procedures: PBS pathology read 09/10/2013 LEUKOCYTOSIS WITH NEUTROPHILIA, AND THROMBOCYTOPENIA.   MRI Brain 09/09/2013 Multiple bilateral supra tentorial and right cerebellar regions of  restricted motion some of which are partially hemorrhagic with  largest area left parietal lobe. In the present clinical setting,  these findings are consistent with acute partially hemorrhagic  infarcts and possibly related to septic emboli.  CT Head W/O Contrast 09/09/2013 1. Increasing edema around linear high attenuation in the left  parietal lobe. Followup MRI with and without contrast is recommended  for further assessment. The patient presents with an infectious  syndrome and septic emboli are in the differential considerations.  Infarct with a small amount of hemorrhage or cortical laminar  necrosis as well as developmental venous anomaly remain in the  differential considerations. MRI should be useful in further  characterization.  2. Dr. Leonel Ramsay called discuss the case at 0230 hr on the day of  Examination.  Echocardiogram 09/09/2013 - Left ventricle: septal and apical hypokinesis The cavity size was mildly dilated. - moderate LVH. Systolic function was mildly reduced.  -LVEF=  45% to 50%. - Aortic valve: Apparent tissue  AVR. No abscess or vegetation seen.  -No AR or perivalvular leak and normal systolic gradients Suggest TEE if clinical suspicion for SBE high - Mitral  valve: Mild regurgitation. - Left atrium: The atrium was mildly dilated.     MRSA Neg PCR     Antibiotics: Gentamicin 2/10>>  Nafcillin 2/10>> Vancomycin 2/10>>     HPI/Subjective: 58 year old male with history of aortic stenosis, status post pediatric aortic valvulotomy at age 65 followed by Bentall procedure 02/2000 with tissue AVR, cardiac cath 02/2010 with normal coronaries, LBBB, thyroid cancer-post thyroidectomy hypothyroidism, HTN and depression has been generally unwell for 2-3 weeks, recently seen in the ED on 09/06/13 and assessed as influenza related vomiting, diarrhea, abdominal pain, body aches, fever and mild confusion. Family was given option of hospitalization versus discharge home-family opted to go home and he was discharged on Tamiflu. However, patient worsened at home with worsening confusion/disorientation and was admitted with sepsis, septic brain embolization and rule out infective endocarditis. Family recently acquired a dog? Stray- 3 weeks ago. 2/11 states negative pain/SOB     Objective: Filed Vitals:   09/10/13 0600 09/10/13 0700 09/10/13 0800 09/10/13 0900  BP: 133/86 141/104 145/116 128/93  Pulse: 110 112 110 108  Temp:    100.5 F (38.1 C)  TempSrc:      Resp: 14 30 35 29  Height:      Weight:      SpO2: 92% 94% 94% 93%    Intake/Output Summary (Last 24 hours) at 09/10/13 0933 Last data filed at 09/10/13 0600  Gross per 24 hour  Intake 2475.5 ml  Output   1450 ml  Net 1025.5 ml   Filed Weights   09/09/13 0337 09/09/13 0500  Weight: 90.719 kg (200 lb) 96 kg (211 lb 10.3 oz)    Exam:   General: A./O. x1 (does not know where, when, why), NAD, cooperative with all commands  Cardiovascular: Regular rhythm and rate, positive systolic murmur 2/5, negative rubs or gallops, DP/PT pulse one plus bilateral  Respiratory: Clear to auscultation bilateral  Abdomen: Soft, nontender, nondistended, plus bowel  Musculoskeletal: Negative pedal  edema, negative ecchymosis  Neurologic; cranial nerves II through XII intact, tongue/the midline, mild left facial droop, unable to track objects with eyes, bilateral upper/lower extremity weakness LUE/LLE 2/5, RUE/RLE 3/5, sensation intact throughout, positive Babinski on the left foot, did not attempt to ambulate.  Data Reviewed: Basic Metabolic Panel:  Recent Labs Lab 09/06/13 2018 09/08/13 2200 09/09/13 0545 09/10/13 0320  NA 136* 130* 134* 138  K 3.6* 3.1* 3.0* 3.8  CL 98 92* 98 103  CO2 23 22 21 23   GLUCOSE 163* 258* 184* 157*  BUN 19 18 18 23   CREATININE 0.90 0.76 0.93 1.00  CALCIUM 9.2 8.8 8.0* 8.0*   Liver Function Tests:  Recent Labs Lab 09/06/13 2018 09/08/13 2200 09/09/13 0545 09/10/13 0320  AST 35 44* 48* 41*  ALT 31 40 37 39  ALKPHOS 57 87 66 66  BILITOT 0.7 0.7 0.9 1.0  PROT 7.3 7.0 5.7* 5.4*  ALBUMIN 3.6 3.0* 2.5* 2.1*    Recent Labs Lab 09/06/13 2018  LIPASE 18    Recent Labs Lab 09/06/13 2018 09/08/13 2336  AMMONIA 31 39   CBC:  Recent Labs Lab 09/06/13 2018 09/08/13 2200 09/09/13 0545 09/09/13 0958 09/10/13 0320  WBC 17.0* 17.9* 18.2*  --  21.3*  NEUTROABS 15.4*  --   --   --   --   HGB 14.2 15.6 14.6  --  14.9  HCT 41.3 44.1 40.1  --  41.2  MCV 88.4 86.1 84.6  --  84.9  PLT 100* 39* 30* 23* 36*  36*   Cardiac Enzymes:  Recent Labs Lab 09/10/13 0753  TROPONINI 2.26*   BNP (last 3 results)  Recent Labs  09/10/13 0753  PROBNP 37349.0*   CBG:  Recent Labs Lab 09/09/13 1734 09/09/13 1949 09/09/13 2316 09/10/13 0336 09/10/13 0859  GLUCAP 168* 164* 141* 147* 148*    Recent Results (from the past 240 hour(s))  CULTURE, BLOOD (ROUTINE X 2)     Status: None   Collection Time    09/08/13 10:00 PM      Result Value Ref Range Status   Specimen Description BLOOD RIGHT ARM   Final   Special Requests BOTTLES DRAWN AEROBIC AND ANAEROBIC 5CC   Final   Culture  Setup Time     Final   Value: 09/09/2013 03:30      Performed at [facility]   Culture     Final   Value: GRAM POSITIVE COCCI IN CLUSTERS     Note: Gram Stain Report Called to,Read Back By and Verified With: NEELY RICHARDSON @ 1220 09/09/13 BY KRAWS     Performed at [facility]   Report Status PENDING   Incomplete  URINE CULTURE     Status: None   Collection Time    09/08/13 10:03 PM      Result Value Ref Range Status   Specimen Description URINE, RANDOM   Final   Special Requests NONE   Final   Culture  Setup Time     Final   Value: 09/09/2013 04:59     Performed at [place]     Final   Value: >=100,000 COLONIES/ML     Performed at [facility]   Culture     Final   Value: STAPHYLOCOCCUS AUREUS     Note: RIFAMPIN AND GENTAMICIN SHOULD NOT BE USED AS SINGLE DRUGS FOR TREATMENT OF STAPH INFECTIONS.     Performed at [facility]   Report Status PENDING   Incomplete  CULTURE, BLOOD (ROUTINE X 2)     Status: None   Collection Time    09/08/13 11:36 PM      Result Value Ref Range Status   Specimen Description BLOOD RIGHT ANTECUBITAL   Final   Special Requests BOTTLES DRAWN AEROBIC AND ANAEROBIC 5CC   Final   Culture  Setup Time     Final   Value: 09/09/2013 03:29     Performed at [facility]   Culture     Final   Value: GRAM POSITIVE COCCI IN CLUSTERS     Note: Gram Stain Report Called to,Read Back By and Verified With:  NEELY RICHARDSON @ 1220 09/09/13 BY KRAWS     Performed at [facility]   Report Status PENDING   Incomplete  MRSA PCR SCREENING     Status: None   Collection Time    09/09/13  4:44 AM      Result Value Ref Range Status   MRSA by PCR NEGATIVE  NEGATIVE Final   Comment:            The GeneXpert MRSA Assay (FDA     approved for NASAL specimens     only), is one component of a     comprehensive MRSA colonization     surveillance program. It is not  intended to diagnose MRSA     infection nor to guide or     monitor treatment for      MRSA infections.     Studies: Ct Head Wo Contrast  09/09/2013   CLINICAL DATA:  Fever.  Altered mental status.  EXAM: CT HEAD WITHOUT CONTRAST  TECHNIQUE: Contiguous axial images were obtained from the base of the skull through the vertex without intravenous contrast.  COMPARISON:  CT HEAD W/O CM dated 09/06/2013; DG CHEST 1V PORT dated 09/08/2013; CT ABD/PELVIS W CM dated 09/06/2013  FINDINGS: Small focus of linear high attenuation in the left parietal lobe appear similar to the prior exam. The amount of edema at has increased compared to the prior exam suggesting a subacute process. This appears wedge-shaped in may represent an embolic infarct. There is a similar faint area of high attenuation in the left frontal lobe on image number 22 low with a more subtle area in the right frontal lobe that is more equivocal. The dural venous sinuses appear within normal limits. No mass lesion or midline shift. No hydrocephalus.  The calvarium is intact. Left-sided nasal septal spur. Mastoid air cells clear. Paranasal sinuses are normal.  IMPRESSION: 1. Increasing edema around linear high attenuation in the left parietal lobe. Followup MRI with and without contrast is recommended for further assessment. The patient presents with an infectious syndrome and septic emboli are in the differential considerations. Infarct with a small amount of hemorrhage or cortical laminar necrosis as well as developmental venous anomaly remain in the differential considerations. MRI should be useful in further characterization. 2. Dr. Leonel Ramsay called discuss the case at 0230 hr on the day of examination.   Electronically Signed   By: Dereck Ligas M.D.   On: 09/09/2013 02:32   [name] [id number] Contrast  09/09/2013   CLINICAL DATA:  Post aortic valve replacement 2011. Presenting with altered mental status, elevated white count and abnormal CT.  EXAM: MRI HEAD WITHOUT AND WITH CONTRAST  TECHNIQUE: Multiplanar, multiecho pulse sequences of the  brain and surrounding structures were obtained without and with intravenous contrast.  CONTRAST:  44mL MULTIHANCE GADOBENATE DIMEGLUMINE 529 MG/ML IV SOLN  COMPARISON:  09/09/2013 and 09/06/2013 CT.  No comparison MR.  FINDINGS: Exam is motion degraded.  Multiple bilateral supra tentorial and right cerebellar regions of restricted motion some of which are partially hemorrhagic with largest area left parietal lobe. In the present clinical setting, these findings are consistent with acute partially hemorrhagic infarcts and possibly related to septic emboli.  At most there is very minimal enhancement of the left parietal partially hemorrhagic infarct.  No intracranial mass lesion seen separate from the above described findings.  Major intracranial vascular structures are patent.  IMPRESSION: Exam is motion degraded.  Multiple bilateral supra tentorial and right cerebellar regions of restricted motion some of which are partially hemorrhagic with largest area left parietal lobe. In the present clinical setting, these findings are consistent with acute partially hemorrhagic infarcts and possibly related to septic emboli.  These results were called by telephone at the time of interpretation on 09/09/2013 at 11:54 AM to Dr. Algis Liming , who verbally acknowledged these results.   Electronically Signed   By: Chauncey Cruel M.D.   On: 09/09/2013 12:03   Dg Chest Port 1 View  09/08/2013   CLINICAL DATA:  Chest pain, hypertension  EXAM: PORTABLE CHEST - 1 VIEW  COMPARISON:  September 06, 2013  FINDINGS: The heart size and mediastinal contours are stable.  Patient is status post prior CABG. The heart size is enlarged. Patchy consolidation of left lung base is identified. There is mild atelectasis of right lung base. There is no pulmonary edema. There is probable small left pleural effusion. The visualized skeletal structures are stable.  IMPRESSION: Early pneumonia of left lung base with small left pleural effusion.   Electronically  Signed   By: Abelardo Diesel M.D.   On: 09/08/2013 23:41    Scheduled Meds: . gentamicin  1 mg/kg Intravenous 3 times per day  . insulin aspart  0-9 Units Subcutaneous 6 times per day  . levothyroxine  87.5 mcg Intravenous Daily  . nafcillin IV  2 g Intravenous Q4H  . sodium chloride  3 mL Intravenous Q12H  . vancomycin  1,000 mg Intravenous Q8H   Continuous Infusions:   Principal Problem:   Sepsis Active Problems:   HYPOTHYROIDISM- TSH 7.5   Aortic valve replaced- Bentall propceedure 2011   Hypertension   Depression   LBBB (left bundle branch block)   Major depressive disorder, recurrent, severe with psychotic features- hospitalized in Jan 2013   Generalized anxiety disorder   Altered mental state   Encephalitis   Septic embolism   HCAP (healthcare-associated pneumonia)   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    Time spent: 57min    Emmajane Altamura, [place], J  [facility] Pager [phone]. If 7PM-7AM, please contact night-coverage at [email or web address], password [facility] 09/10/2013, 9:33 AM  LOS: 2 days

## 2013-09-10 NOTE — Progress Notes (Signed)
[facility]    Date of Admission:  09/08/2013   Total days of antibiotics 3        Day 3 vanco        Day 3 gent        Day 2 nafcillin   ID: Bradley [name] is a 58 y.o. male with presumed prosthetic AV endocarditis with septic emboli to CNS  Principal Problem:   Sepsis Active Problems:   HYPOTHYROIDISM- TSH 7.5   Aortic valve replaced- Bentall propceedure 2011   Hypertension   Depression   LBBB (left bundle branch block)   Major depressive disorder, recurrent, severe with psychotic features- hospitalized in Jan 2013   Generalized anxiety disorder   Altered mental state   Encephalitis   Septic embolism   HCAP (healthcare-associated pneumonia)   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    Subjective: Febrile yesterday up to 102.67F. Mental status improving still not back to baseline.  24hr events: TTE did not show vegetation. Leukocytosis worsening up to 21K, plt imporved to 36 from 23  Medications:  . gentamicin  1 mg/kg Intravenous 3 times per day  . insulin aspart  0-9 Units Subcutaneous 6 times per day  . levothyroxine  87.5 mcg Intravenous Daily  . nafcillin IV  2 g Intravenous Q4H  . sodium chloride  1,000 mL Intravenous Once  . sodium chloride  3 mL Intravenous Q12H  . vancomycin  1,000 mg Intravenous Q8H    Objective: Vital signs in last 24 hours: Temp:  [97.8 F (36.6 C)-102.3 F (39.1 C)] 100.5 F (38.1 C) (02/11 0900) Pulse Rate:  [108-130] 108 (02/11 0900) Resp:  [14-42] 29 (02/11 0900) BP: (114-146)/(73-116) 128/93 mmHg (02/11 0900) SpO2:  [91 %-96 %] 93 % (02/11 0900) Physical Exam  Constitutional: He is oriented to person and place. He appears well-developed and well-nourished. No distress.  HENT:  Mouth/Throat: Oropharynx is clear and moist. No oropharyngeal exudate.  Cardiovascular: Normal rate, regular rhythm and normal heart sounds. Exam reveals no gallop and no friction rub.  2/6 SEM BH at [facility] Pulmonary/Chest: Effort normal  and breath sounds normal. No respiratory distress. He has no wheezes.  Abdominal: Soft. Bowel sounds are normal. He exhibits no distension. There is no tenderness.  Lymphadenopathy:  He has no cervical adenopathy.  Neurological: He is alert and oriented to person, place, and time.  Skin: + splinter hemorrhages.Skin is warm and dry. Scattered petechaie to lower extremities   Lab Results  Recent Labs  09/09/13 0545 09/10/13 0320 09/10/13 0944  WBC 123XX123* Q000111Q* DUPLICATE REQUEST SEE Q000111Q  HGB 123456 123456 DUPLICATE REQUEST SEE Q000111Q  HCT 0000000 [id number] DUPLICATE REQUEST SEE Q000111Q  NA 134* 138  --   K 3.0* 3.8  --   CL 98 103  --   CO2 21 23  --   BUN 18 23  --   CREATININE 0.93 1.00  --    Liver Panel  Recent Labs  09/09/13 0545 09/10/13 0320  PROT 5.7* 5.4*  ALBUMIN 2.5* 2.1*  AST 48* 41*  ALT 37 39  ALKPHOS 66 66  BILITOT 0.9 1.0   Microbiology: 2/9 urine cx staph aureus 100K 2/9 blood cx GPCC, identification and sensi pending MRSA by pcr is negative  Studies/Results: Ct Head Wo Contrast  09/09/2013   CLINICAL DATA:  Fever.  Altered mental status.  EXAM: CT HEAD WITHOUT CONTRAST  TECHNIQUE: Contiguous axial images were obtained from the base of the skull through  the vertex without intravenous contrast.  COMPARISON:  CT HEAD W/O CM dated 09/06/2013; DG CHEST 1V PORT dated 09/08/2013; CT ABD/PELVIS W CM dated 09/06/2013  FINDINGS: Small focus of linear high attenuation in the left parietal lobe appear similar to the prior exam. The amount of edema at has increased compared to the prior exam suggesting a subacute process. This appears wedge-shaped in may represent an embolic infarct. There is a similar faint area of high attenuation in the left frontal lobe on image number 22 low with a more subtle area in the right frontal lobe that is more equivocal. The dural venous sinuses appear within normal limits. No mass lesion or midline shift. No hydrocephalus.  The calvarium is intact.  Left-sided nasal septal spur. Mastoid air cells clear. Paranasal sinuses are normal.  IMPRESSION: 1. Increasing edema around linear high attenuation in the left parietal lobe. Followup MRI with and without contrast is recommended for further assessment. The patient presents with an infectious syndrome and septic emboli are in the differential considerations. Infarct with a small amount of hemorrhage or cortical laminar necrosis as well as developmental venous anomaly remain in the differential considerations. MRI should be useful in further characterization. 2. Dr. Leonel [name] called discuss the case at 0230 hr on the day of examination.   Electronically Signed   By: Dereck Ligas M.D.   On: 09/09/2013 02:32   Mr Bradley [name] [id number] Contrast  09/09/2013   CLINICAL DATA:  Post aortic valve replacement 2011. Presenting with altered mental status, elevated white count and abnormal CT.  EXAM: MRI HEAD WITHOUT AND WITH CONTRAST  TECHNIQUE: Multiplanar, multiecho pulse sequences of the brain and surrounding structures were obtained without and with intravenous contrast.  CONTRAST:  86mL MULTIHANCE GADOBENATE DIMEGLUMINE 529 MG/ML IV SOLN  COMPARISON:  09/09/2013 and 09/06/2013 CT.  No comparison MR.  FINDINGS: Exam is motion degraded.  Multiple bilateral supra tentorial and right cerebellar regions of restricted motion some of which are partially hemorrhagic with largest area left parietal lobe. In the present clinical setting, these findings are consistent with acute partially hemorrhagic infarcts and possibly related to septic emboli.  At most there is very minimal enhancement of the left parietal partially hemorrhagic infarct.  No intracranial mass lesion seen separate from the above described findings.  Major intracranial vascular structures are patent.  IMPRESSION: Exam is motion degraded.  Multiple bilateral supra tentorial and right cerebellar regions of restricted motion some of which are partially hemorrhagic with  largest area left parietal lobe. In the present clinical setting, these findings are consistent with acute partially hemorrhagic infarcts and possibly related to septic emboli.  These results were called by telephone at the time of interpretation on 09/09/2013 at 11:54 AM to Dr. Algis Liming , who verbally acknowledged these results.   Electronically Signed   By: Chauncey Cruel M.D.   On: 09/09/2013 12:03   Dg Chest Port 1 View  09/08/2013   CLINICAL DATA:  Chest pain, hypertension  EXAM: PORTABLE CHEST - 1 VIEW  COMPARISON:  September 06, 2013  FINDINGS: The heart size and mediastinal contours are stable. Patient is status post prior CABG. The heart size is enlarged. Patchy consolidation of left lung base is identified. There is mild atelectasis of right lung base. There is no pulmonary edema. There is probable small left pleural effusion. The visualized skeletal structures are stable.  IMPRESSION: Early pneumonia of left lung base with small left pleural effusion.   Electronically Signed   By: Seward Meth  Augustin Coupe M.D.   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 - will repeat blood cultures today to document clearance - per cardiology, once platelet recovers then can go onto doing TEE, likely at end of the week - continue to monitor neuro exam to see if any worsening symptoms due to hemorrhagic infarcts noted on mri/nchct  [facility], [facility] for Infectious Diseases Cell: [phone] Pager: [phone]  09/10/2013, 10:26 AM

## 2013-09-11 DIAGNOSIS — A4901 Methicillin susceptible Staphylococcus aureus infection, unspecified site: Secondary | ICD-10-CM

## 2013-09-11 DIAGNOSIS — I5031 Acute diastolic (congestive) heart failure: Secondary | ICD-10-CM | POA: Diagnosis present

## 2013-09-11 DIAGNOSIS — I214 Non-ST elevation (NSTEMI) myocardial infarction: Secondary | ICD-10-CM | POA: Diagnosis present

## 2013-09-11 DIAGNOSIS — G9340 Encephalopathy, unspecified: Secondary | ICD-10-CM

## 2013-09-11 DIAGNOSIS — D6959 Other secondary thrombocytopenia: Secondary | ICD-10-CM

## 2013-09-11 LAB — CBC WITH DIFFERENTIAL/PLATELET
Basophils Absolute: 0 10*3/uL (ref 0.0–0.1)
Basophils Absolute: 0 10*3/uL (ref 0.0–0.1)
Basophils Relative: 0 % (ref 0–1)
Basophils Relative: 0 % (ref 0–1)
Eosinophils Absolute: 0 10*3/uL (ref 0.0–0.7)
Eosinophils Absolute: 0 10*3/uL (ref 0.0–0.7)
Eosinophils Relative: 0 % (ref 0–5)
Eosinophils Relative: 0 % (ref 0–5)
HCT: 35.8 % — ABNORMAL LOW (ref 39.0–52.0)
HCT: 36.6 % — ABNORMAL LOW (ref 39.0–52.0)
Hemoglobin: 12.7 g/dL — ABNORMAL LOW (ref 13.0–17.0)
Hemoglobin: 13 g/dL (ref 13.0–17.0)
Lymphocytes Relative: 3 % — ABNORMAL LOW (ref 12–46)
Lymphocytes Relative: 4 % — ABNORMAL LOW (ref 12–46)
Lymphs Abs: 0.7 10*3/uL (ref 0.7–4.0)
Lymphs Abs: 0.9 10*3/uL (ref 0.7–4.0)
MCH: 30.1 pg (ref 26.0–34.0)
MCH: 29.7 pg (ref 26.0–34.0)
MCHC: 35.5 g/dL (ref 30.0–36.0)
MCHC: 35.5 g/dL (ref 30.0–36.0)
MCV: 84.8 fL (ref 78.0–100.0)
MCV: 83.8 fL (ref 78.0–100.0)
Monocytes Absolute: 2.5 10*3/uL — ABNORMAL HIGH (ref 0.1–1.0)
Monocytes Absolute: 2.8 10*3/uL — ABNORMAL HIGH (ref 0.1–1.0)
Monocytes Relative: 11 % (ref 3–12)
Monocytes Relative: 12 % (ref 3–12)
Neutro Abs: 19.8 10*3/uL — ABNORMAL HIGH (ref 1.7–7.7)
Neutro Abs: 19.5 10*3/uL — ABNORMAL HIGH (ref 1.7–7.7)
Neutrophils Relative %: 86 % — ABNORMAL HIGH (ref 43–77)
Neutrophils Relative %: 84 % — ABNORMAL HIGH (ref 43–77)
Platelets: 26 10*3/uL — CL (ref 150–400)
Platelets: 35 10*3/uL — ABNORMAL LOW (ref 150–400)
RBC: 4.22 MIL/uL (ref 4.22–5.81)
RBC: 4.37 MIL/uL (ref 4.22–5.81)
RDW: 14.7 % (ref 11.5–15.5)
RDW: 14.6 % (ref 11.5–15.5)
WBC: 23 10*3/uL — ABNORMAL HIGH (ref 4.0–10.5)
WBC: 23.2 10*3/uL — ABNORMAL HIGH (ref 4.0–10.5)

## 2013-09-11 LAB — GLUCOSE, CAPILLARY
Glucose-Capillary: 132 mg/dL — ABNORMAL HIGH (ref 70–99)
Glucose-Capillary: 133 mg/dL — ABNORMAL HIGH (ref 70–99)
Glucose-Capillary: 134 mg/dL — ABNORMAL HIGH (ref 70–99)
Glucose-Capillary: 147 mg/dL — ABNORMAL HIGH (ref 70–99)
Glucose-Capillary: 159 mg/dL — ABNORMAL HIGH (ref 70–99)
Glucose-Capillary: 164 mg/dL — ABNORMAL HIGH (ref 70–99)

## 2013-09-11 LAB — BASIC METABOLIC PANEL
BUN: 27 mg/dL — ABNORMAL HIGH (ref 6–23)
CO2: 25 mEq/L (ref 19–32)
Calcium: 7.8 mg/dL — ABNORMAL LOW (ref 8.4–10.5)
Chloride: 109 mEq/L (ref 96–112)
Creatinine, Ser: 1.01 mg/dL (ref 0.50–1.35)
GFR calc Af Amer: >90 mL/min (ref >90)
GFR calc non Af Amer: 81 mL/min — ABNORMAL LOW (ref >90)
Glucose, Bld: 148 mg/dL — ABNORMAL HIGH (ref 70–99)
Potassium: 2.9 mEq/L — CL (ref 3.7–5.3)
Sodium: 144 mEq/L (ref 137–147)

## 2013-09-11 LAB — URINE CULTURE: Colony Count: >=100000

## 2013-09-11 LAB — PROTIME-INR
INR: 1.2 (ref 0.00–1.49)
Prothrombin Time: 14.9 seconds (ref 11.6–15.2)

## 2013-09-11 LAB — POTASSIUM: Potassium: 3 mEq/L — ABNORMAL LOW (ref 3.7–5.3)

## 2013-09-11 LAB — GENTAMICIN LEVEL, TROUGH: Gentamicin Trough: 1.4 ug/mL (ref 0.5–2.0)

## 2013-09-11 LAB — CULTURE, BLOOD (ROUTINE X 2)

## 2013-09-11 LAB — MAGNESIUM: Magnesium: 2.2 mg/dL (ref 1.5–2.5)

## 2013-09-11 LAB — HEPARIN INDUCED THROMBOCYTOPENIA PNL
Heparin Induced Plt Ab: NEGATIVE
Patient O.D.: 0.063
UFH High Dose UFH H: 6 % Release
UFH Low Dose 0.1 IU/mL: 3 % Release
UFH Low Dose 0.5 IU/mL: 1 % Release
UFH SRA Result: NEGATIVE

## 2013-09-11 LAB — GENTAMICIN LEVEL, PEAK: Gentamicin Pk: 3.8 ug/mL — ABNORMAL LOW (ref 5.0–10.0)

## 2013-09-11 MED ORDER — GENTAMICIN SULFATE 40 MG/ML IJ SOLN
90.0000 mg | Freq: Two times a day (BID) | INTRAVENOUS | Status: DC
  Administered 2013-09-11 – 2013-09-13 (×4): 90 mg via INTRAVENOUS
  Filled 2013-09-11 (×4): qty 2.25

## 2013-09-11 MED ORDER — HALOPERIDOL LACTATE 5 MG/ML IJ SOLN
2.0000 mg | Freq: Four times a day (QID) | INTRAMUSCULAR | Status: DC | PRN
  Administered 2013-09-11 – 2013-09-12 (×3): 2 mg via INTRAVENOUS
  Filled 2013-09-11 (×3): qty 1

## 2013-09-11 MED ORDER — BIOTENE DRY MOUTH MT LIQD
15.0000 mL | Freq: Two times a day (BID) | OROMUCOSAL | Status: DC
Start: 2013-09-11 — End: 2013-10-10
  Administered 2013-09-11 – 2013-10-10 (×38): 15 mL via OROMUCOSAL

## 2013-09-11 MED ORDER — CHLORHEXIDINE GLUCONATE 0.12 % MT SOLN
15.0000 mL | Freq: Two times a day (BID) | OROMUCOSAL | Status: DC
  Administered 2013-09-11 – 2013-10-02 (×33): 15 mL via OROMUCOSAL
  Filled 2013-09-11 (×40): qty 15

## 2013-09-11 MED ORDER — POTASSIUM CHLORIDE 10 MEQ/100ML IV SOLN
10.0000 meq | INTRAVENOUS | Status: AC
  Administered 2013-09-11 (×3): 10 meq via INTRAVENOUS
  Filled 2013-09-11 (×2): qty 100

## 2013-09-11 MED ORDER — LORAZEPAM 2 MG/ML IJ SOLN
1.0000 mg | Freq: Once | INTRAMUSCULAR | Status: AC
  Administered 2013-09-11: 1 mg via INTRAVENOUS
  Filled 2013-09-11: qty 1

## 2013-09-11 MED ORDER — VITAMINS A & D EX OINT
TOPICAL_OINTMENT | CUTANEOUS | Status: AC
  Administered 2013-09-11: 03:00:00
  Filled 2013-09-11: qty 5

## 2013-09-11 NOTE — Progress Notes (Signed)
[facility] PROGRESS NOTE  Bradley [name] [id number] DOB: Dec 12, 1955 DOA: 09/08/2013 PCP: [name], MD  Assessment/Plan:  Gram-positive cocci sepsis/septic brain emboli suspicious for infective endocarditis/? CAP/?? - Patient was admitted to ICU. Blood cultures x2 shows gram-positive cocci in clusters.  -. Influenza panel PCR negative.  -Chest x-ray suggests early pneumonia of left lung base with small left pleural effusion.  -Transthoracic echo requested and pending.  -Continue IV vancomycin,  nafcillin and gentamicin.  -Infectious disease consulted. - Decrease normal saline75 ml/hr (fluid overload)  UTI -Urine culture: Positive Staph Aureus susceptibilities pending - continue ABx    Acute encephalopathy  - Most likely secondary to severe sepsis and septic brain emboli.  -Continue antibiotics  -Patient more awake today will order bedside swallow    Severe thrombocytopenia  - Platelet counts have dropped from 100 (09/06/13) > 23 (09/09/13); 2/12= 26. Most likely secondary to severe sepsis. 2/12 INR= 1.2 normal. Fibrinogen 586.  -No schistocytes on peripheral smear.  -DIC panel positive  - HIT panel pending requested and pending.  -Hematology consulted.  -No overt bleeding at this time.  -No antiplatelets or heparin products at this time.  -Trend daily CBCs.  -Transfuse if has overt bleeding or platelet count less than 10 K.   Hypokalemia  - 2/12 Replace intravenously -Recheck potassium at 1300 on 2/12   Leukocytosis  - Secondary to sepsis. Trend daily CBCs   Active MI  - 2/11 Troponin =2.26 -Trend Troponin    Systolic and Diastolic CHF - Acute vs Chronic ? -Will decrease normal saline to 110ml/hr to avoid fluid overload  Status post tissue aVR/LBBB  - Requested transthoracic 2-D echocardiogram.TEE when stable. - Cardiology consulted. TTE Eventually?    History of thyroid cancer, post thyroidectomy hypothyroidism  - Thyroid supplements. Elevated TSH  may be secondary to acute illness. - - Workup for Sick U Thyroid Syn -  Recommend followup in 4-6 weeks.   Hypertension  - Continue when necessary IV metoprolol. Hold lisinopril.   ? Left hemiparesis/Septic Emboli-hemorrhagic  -  Hyperglycemia  - Hemoglobin A1c = 5.7  -Continue sensitive SSI.   History of depression  - hold Zoloft secondary to acute encephalopathy.    Code Status: Full Family Communication: Daughter present for discussion of plan of care Disposition Plan: Resolution of sepsis,   Consultants: Dr Kathrynn Speed (Neurology)  Dr Lurline Del (Oncology) Dr Dorris Carnes (Cardiology)    Procedures: PBS pathology read 09/10/2013 LEUKOCYTOSIS WITH NEUTROPHILIA, AND THROMBOCYTOPENIA.   MRI Brain 09/09/2013 Multiple bilateral supra tentorial and right cerebellar regions of  restricted motion some of which are partially hemorrhagic with  largest area left parietal lobe. In the present clinical setting,  these findings are consistent with acute partially hemorrhagic  infarcts and possibly related to septic emboli.  CT Head W/O Contrast 09/09/2013 1. Increasing edema around linear high attenuation in the left  parietal lobe. Followup MRI with and without contrast is recommended  for further assessment. The patient presents with an infectious  syndrome and septic emboli are in the differential considerations.  Infarct with a small amount of hemorrhage or cortical laminar  necrosis as well as developmental venous anomaly remain in the  differential considerations. MRI should be useful in further  characterization.  2. Dr. Leonel Ramsay called discuss the case at 0230 hr on the day of  Examination.  Echocardiogram 09/09/2013 - Left ventricle: septal and apical hypokinesis The cavity size was mildly dilated. - moderate LVH. Systolic function was mildly reduced.  -LVEF=  45% to 50%. - Aortic valve: Apparent tissue AVR. No abscess or vegetation seen.  -No AR or  perivalvular leak and normal systolic gradients Suggest TEE if clinical suspicion for SBE high - Mitral valve: Mild regurgitation. - Left atrium: The atrium was mildly dilated.     MRSA Neg PCR     Antibiotics: Gentamicin 2/10>>  Nafcillin 2/10>> Vancomycin 2/10>>     HPI/Subjective: 58 year old male with history of aortic stenosis, status post pediatric aortic valvulotomy at age 39 followed by Bentall procedure 02/2000 with tissue AVR, cardiac cath 02/2010 with normal coronaries, LBBB, thyroid cancer-post thyroidectomy hypothyroidism, HTN and depression has been generally unwell for 2-3 weeks, recently seen in the ED on 09/06/13 and assessed as influenza related vomiting, diarrhea, abdominal pain, body aches, fever and mild confusion. Family was given option of hospitalization versus discharge home-family opted to go home and he was discharged on Tamiflu. However, patient worsened at home with worsening confusion/disorientation and was admitted with sepsis, septic brain embolization and rule out infective endocarditis. Family recently acquired a dog? Stray- 3 weeks ago. 2/11 states negative pain/SOB 2/12 patient A./O. x1, will follow some commands. Becomes agitated as the day progresses     Objective: Filed Vitals:   09/11/13 0300 09/11/13 0400 09/11/13 0500 09/11/13 0600  BP:  132/91  130/81  Pulse: 103 103 107 106  Temp:  98.3 F (36.8 C)    TempSrc:  Axillary    Resp: 19 23 18 22   Height:      Weight:      SpO2: 95% 93% 93% 95%    Intake/Output Summary (Last 24 hours) at 09/11/13 0737 Last data filed at 09/11/13 0600  Gross per 24 hour  Intake 4246.17 ml  Output    600 ml  Net 3646.17 ml   Filed Weights   09/09/13 0337 09/09/13 0500  Weight: 90.719 kg (200 lb) 96 kg (211 lb 10.3 oz)    Exam:   General: A./O. x1 (does not know where, when, why), NAD, cooperative with all commands  Cardiovascular: Regular rhythm and rate, positive systolic murmur 2/5, negative  rubs or gallops, DP/PT pulse one plus bilateral  Respiratory: Clear to auscultation bilateral  Abdomen: Soft, nontender, nondistended, plus bowel  Musculoskeletal: Negative pedal edema, negative ecchymosis  Neurologic; cranial nerves II through XII intact, tongue/the midline, mild left facial droop, unable to track objects with eyes, bilateral upper/lower extremity weakness LUE/LLE 2/5, RUE/RLE 3/5, sensation intact throughout, positive Babinski on the left foot, did not attempt to ambulate.  Data Reviewed: Basic Metabolic Panel:  Recent Labs Lab 09/06/13 2018 09/08/13 2200 09/09/13 0545 09/10/13 0320 09/11/13 0546  NA 136* 130* 134* 138 144  K 3.6* 3.1* 3.0* 3.8 2.9*  CL 98 92* 98 103 109  CO2 23 22 21 23 25   GLUCOSE 163* 258* 184* 157* 148*  BUN 19 18 18 23  27*  CREATININE 0.90 0.76 0.93 1.00 1.01  CALCIUM 9.2 8.8 8.0* 8.0* 7.8*  MG  --   --   --  2.2  --    Liver Function Tests:  Recent Labs Lab 09/06/13 2018 09/08/13 2200 09/09/13 0545 09/10/13 0320  AST 35 44* 48* 41*  ALT 31 40 37 39  ALKPHOS 57 87 66 66  BILITOT 0.7 0.7 0.9 1.0  PROT 7.3 7.0 5.7* 5.4*  ALBUMIN 3.6 3.0* 2.5* 2.1*    Recent Labs Lab 09/06/13 2018  LIPASE 18    Recent Labs Lab 09/06/13 2018 09/08/13 2336  AMMONIA 31  39   CBC:  Recent Labs Lab 09/06/13 2018 09/08/13 2200 09/09/13 0545 09/09/13 0958 09/10/13 0320 09/10/13 0944  WBC 17.0* 17.9* 123XX123*  --  Q000111Q* DUPLICATE REQUEST SEE Q000111Q  NEUTROABS 15.4*  --   --   --  17.9* PENDING  HGB 14.2 15.6 123456  --  123456 DUPLICATE REQUEST SEE Q000111Q  HCT 41.3 44.1 0000000  --  [id number] DUPLICATE REQUEST SEE Q000111Q  MCV 88.4 86.1 99991111  --  99991111 DUPLICATE REQUEST SEE Q000111Q  PLT 100* 39* 30* 23* 36*  36* DUPLICATE REQUEST SEE Q000111Q   Cardiac Enzymes:  Recent Labs Lab 09/10/13 0753 09/10/13 1500 09/10/13 2315  TROPONINI 2.26* 2.05* 1.78*   BNP (last 3 results)  Recent Labs  09/10/13 0753  PROBNP 37349.0*   CBG:  Recent  Labs Lab 09/10/13 0859 09/10/13 1258 09/10/13 1647 09/10/13 2010 09/11/13 0415  GLUCAP 148* 132* 168* 139* 164*    Recent Results (from the past 240 hour(s))  CULTURE, BLOOD (ROUTINE X 2)     Status: None   Collection Time    09/08/13 10:00 PM      Result Value Ref Range Status   Specimen Description BLOOD RIGHT ARM   Final   Special Requests BOTTLES DRAWN AEROBIC AND ANAEROBIC 5CC   Final   Culture  Setup Time     Final   Value: 09/09/2013 03:30     Performed at [facility]   Culture     Final   Value: STAPHYLOCOCCUS AUREUS     Note: RIFAMPIN AND GENTAMICIN SHOULD NOT BE USED AS SINGLE DRUGS FOR TREATMENT OF STAPH INFECTIONS.     Note: Gram Stain Report Called to,Read Back By and Verified With: NEELY RICHARDSON @ 1220 09/09/13 BY KRAWS     Performed at [facility]   Report Status PENDING   Incomplete  URINE CULTURE     Status: None   Collection Time    09/08/13 10:03 PM      Result Value Ref Range Status   Specimen Description URINE, RANDOM   Final   Special Requests NONE   Final   Culture  Setup Time     Final   Value: 09/09/2013 04:59     Performed at [facility]     Final   Value: >=100,000 COLONIES/ML     Performed at [facility]   Culture     Final   Value: STAPHYLOCOCCUS AUREUS     Note: RIFAMPIN AND GENTAMICIN SHOULD NOT BE USED AS SINGLE DRUGS FOR TREATMENT OF STAPH INFECTIONS.     Performed at [facility]   Report Status 09/11/2013 FINAL   Final   Organism ID, Bacteria STAPHYLOCOCCUS AUREUS   Final  CULTURE, BLOOD (ROUTINE X 2)     Status: None   Collection Time    09/08/13 11:36 PM      Result Value Ref Range Status   Specimen Description BLOOD RIGHT ANTECUBITAL   Final   Special Requests BOTTLES DRAWN AEROBIC AND ANAEROBIC 5CC   Final   Culture  Setup Time     Final   Value: 09/09/2013 03:29     Performed at [facility]   Culture     Final   Value: STAPHYLOCOCCUS AUREUS     Note:  Gram Stain Report Called to,Read Back By and Verified With:  NEELY RICHARDSON @ 1220 09/09/13 BY KRAWS     Performed at [facility]   Report Status PENDING   Incomplete  MRSA PCR SCREENING     Status: None   Collection Time    10-04-13  4:44 AM      Result Value Ref Range Status   MRSA by PCR NEGATIVE  NEGATIVE Final   Comment:            The GeneXpert MRSA Assay (FDA     approved for NASAL specimens     only), is one component of a     comprehensive MRSA colonization     surveillance program. It is not     intended to diagnose MRSA     infection nor to guide or     monitor treatment for     MRSA infections.     Studies: Mr Kizzie Fantasia Contrast  10/04/2013   CLINICAL DATA:  Post aortic valve replacement 2011. Presenting with altered mental status, elevated white count and abnormal CT.  EXAM: MRI HEAD WITHOUT AND WITH CONTRAST  TECHNIQUE: Multiplanar, multiecho pulse sequences of the brain and surrounding structures were obtained without and with intravenous contrast.  CONTRAST:  50mL MULTIHANCE GADOBENATE DIMEGLUMINE 529 MG/ML IV SOLN  COMPARISON:  2013/10/04 and 09/06/2013 CT.  No comparison MR.  FINDINGS: Exam is motion degraded.  Multiple bilateral supra tentorial and right cerebellar regions of restricted motion some of which are partially hemorrhagic with largest area left parietal lobe. In the present clinical setting, these findings are consistent with acute partially hemorrhagic infarcts and possibly related to septic emboli.  At most there is very minimal enhancement of the left parietal partially hemorrhagic infarct.  No intracranial mass lesion seen separate from the above described findings.  Major intracranial vascular structures are patent.  IMPRESSION: Exam is motion degraded.  Multiple bilateral supra tentorial and right cerebellar regions of restricted motion some of which are partially hemorrhagic with largest area left parietal lobe. In the present clinical setting,  these findings are consistent with acute partially hemorrhagic infarcts and possibly related to septic emboli.  These results were called by telephone at the time of interpretation on Oct 04, 2013 at 11:54 AM to Dr. Algis Liming , who verbally acknowledged these results.   Electronically Signed   By: Chauncey Cruel M.D.   On: 10-04-13 12:03    Scheduled Meds: . antiseptic oral rinse  15 mL Mouth Rinse q12n4p  . chlorhexidine  15 mL Mouth Rinse BID  . gentamicin  1 mg/kg Intravenous 3 times per day  . influenza vac split quadrivalent PF  0.5 mL Intramuscular Tomorrow-1000  . insulin aspart  0-9 Units Subcutaneous 6 times per day  . levothyroxine  87.5 mcg Intravenous Daily  . nafcillin IV  2 g Intravenous Q4H  . sodium chloride  3 mL Intravenous Q12H  . vancomycin  1,000 mg Intravenous Q8H   Continuous Infusions: . sodium chloride 125 mL/hr at 09/11/13 [id number]    Principal Problem:   Sepsis Active Problems:   HYPOTHYROIDISM- TSH 7.5   Aortic valve replaced- Bentall propceedure 2011   Hypertension   Depression   LBBB (left bundle branch block)   Major depressive disorder, recurrent, severe with psychotic features- hospitalized in Jan 2013   Generalized anxiety disorder   Altered mental state   Encephalitis   Septic embolism   HCAP (healthcare-associated pneumonia)   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    Time spent: 58min    Abdikadir Fohl, [place], J  [facility] Pager [phone]. If 7PM-7AM, please contact night-coverage at [email or web address], password [facility]  09/11/2013, 7:37 AM  LOS: 3 days

## 2013-09-11 NOTE — Progress Notes (Signed)
Dr Sherral Hammers paged for increased agitation noted and cursing at frustration. Orders received.

## 2013-09-11 NOTE — Progress Notes (Deleted)
[place] for Infectious Disease    Date of Admission:  09/08/2013   Total days of antibiotics 3        Day 3 vanco        Day 3 gent        Day 2 nafcillin   ID: Bradley [name] is a 58 y.o. 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 Mental status improving still not back to baseline.  24hr events: TTE did not show vegetation. Leukocytosis worsening up to 21K, plt imporved to 36 from 23  Medications:  . antiseptic oral rinse  15 mL Mouth Rinse q12n4p  . chlorhexidine  15 mL Mouth Rinse BID  . gentamicin  90 mg Intravenous Q12H  . insulin aspart  0-9 Units Subcutaneous 6 times per day  . levothyroxine  87.5 mcg Intravenous Daily  . nafcillin IV  2 g Intravenous Q4H  . potassium chloride  10 mEq Intravenous Q1 Hr x 3  . sodium chloride  3 mL Intravenous Q12H    Objective: Vital signs in last 24 hours: Temp:  [97.9 F (36.6 C)-99.9 F (37.7 C)] 99.9 F (37.7 C) (02/12 0800) Pulse Rate:  [97-111] 104 (02/12 0800) Resp:  [17-39] 22 (02/12 0800) BP: (121-157)/(71-101) 144/89 mmHg (02/12 0800) SpO2:  [92 %-96 %] 95 % (02/12 0800) Physical Exam  Constitutional: He is oriented to person and place. He appears well-developed and well-nourished. No distress.  HENT:  Mouth/Throat: Oropharynx is clear and moist. No oropharyngeal exudate.  Cardiovascular: Normal rate, regular  rhythm and normal heart sounds. Exam reveals no gallop and no friction rub.  2/6 SEM BH at [facility] Pulmonary/Chest: Effort normal and breath sounds normal. No respiratory distress. He has no wheezes.  Abdominal: Soft. Bowel sounds are normal. He exhibits no distension. There is no tenderness.  Lymphadenopathy:  He has no cervical adenopathy.  Neurological: He is alert and oriented to person, place, and time.  Skin: + splinter hemorrhages.Skin is warm and dry. Scattered petechaie to lower extremities   Lab Results  Recent Labs  09/10/13 0320 09/10/13 0944 09/11/13 0546 09/11/13 0000000  WBC Q000111Q* DUPLICATE REQUEST SEE Q000111Q  --  Q000111Q*  HGB 123456 DUPLICATE REQUEST SEE Q000111Q  --  Q000111Q*  HCT [id number] DUPLICATE REQUEST SEE Q000111Q  --  35.8*  NA 138  --  144  --   K 3.8  --  2.9*  --   CL 103  --  109  --   CO2 23  --  25  --   BUN 23  --  27*  --   CREATININE 1.00  --  1.01  --    Liver Panel  Recent Labs  09/09/13 0545 09/10/13 0320  PROT 5.7* 5.4*  ALBUMIN 2.5* 2.1*  AST 48* 41*  ALT 37 39  ALKPHOS 66 66  BILITOT 0.9 1.0   Microbiology: 2/9 urine cx  staph aureus 100K 2/9 blood cx [facility]CC, identification and sensi pending MRSA by pcr is negative  Studies/Results: Mr Kizzie Fantasia Contrast  09/09/2013   CLINICAL DATA:  Post aortic valve replacement 2011. Presenting with altered mental status, elevated white count and abnormal CT.  EXAM: MRI HEAD WITHOUT AND WITH CONTRAST  TECHNIQUE: Multiplanar, multiecho pulse sequences of the brain and surrounding structures were obtained without and with intravenous contrast.  CONTRAST:  57mL MULTIHANCE GADOBENATE DIMEGLUMINE 529 MG/ML IV SOLN  COMPARISON:  09/09/2013 and 09/06/2013 CT.  No comparison MR.  FINDINGS: Exam is motion degraded.  Multiple bilateral supra tentorial and right cerebellar regions of restricted motion some of which are partially hemorrhagic with largest area left parietal lobe. In the present clinical setting, these findings are  consistent with acute partially hemorrhagic infarcts and possibly related to septic emboli.  At most there is very minimal enhancement of the left parietal partially hemorrhagic infarct.  No intracranial mass lesion seen separate from the above described findings.  Major intracranial vascular structures are patent.  IMPRESSION: Exam is motion degraded.  Multiple bilateral supra tentorial and right cerebellar regions of restricted motion some of which are partially hemorrhagic with largest area left parietal lobe. In the present clinical setting, these findings are consistent with acute partially hemorrhagic infarcts and possibly related to septic emboli.  These results were called by telephone at the time of interpretation on 09/09/2013 at 11:54 AM to Dr. Algis Liming , who verbally acknowledged these results.   Electronically Signed   By: Chauncey Cruel M.D.   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 - will repeat blood cultures today to document clearance - per cardiology, once platelet recovers then can go onto doing TEE, likely at end of the week - continue to monitor neuro exam to see if any worsening symptoms due to hemorrhagic infarcts noted on mri/nchct  [place], [facility] for Infectious Diseases Cell: [phone] Pager: [phone]  09/11/2013, 10:13 AM

## 2013-09-11 NOTE — Progress Notes (Addendum)
[facility] for Infectious Disease    Date of Admission:  09/08/2013   Total days of antibiotics 4        Day 4 vanco        Day 4 gent        Day 3 nafcillin   ID: Bradley [name] is a 58 y.o. 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  Principal Problem:   Sepsis Active Problems:   HYPOTHYROIDISM- TSH 7.5   Aortic valve replaced- Bentall propceedure 2011   Hypertension   Depression   LBBB (left bundle branch block)   Major depressive disorder, recurrent, severe with psychotic features- hospitalized in Jan 2013   Generalized anxiety disorder   Altered mental state   Encephalitis   Septic embolism   HCAP (healthcare-associated pneumonia)   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   Acute diastolic heart failure   NSTEMI (non-ST elevated myocardial infarction)    Subjective: tmax yesterday up to 100.24F, this afternoon slightly worse encephalopathy than yesterday. Responds to name. "its Wednesday" denies back pain or joint pain. + productive cough.  24hr events: remains encephalopathic. Leukocytosis worsening up to 23K, plt down to 26  Medications:  . antiseptic oral rinse  15 mL Mouth Rinse q12n4p  . chlorhexidine  15 mL Mouth Rinse BID  . gentamicin  1 mg/kg Intravenous 3 times per day  . influenza vac split quadrivalent PF  0.5 mL Intramuscular Tomorrow-1000  . insulin aspart  0-9 Units Subcutaneous 6 times per day  . levothyroxine  87.5 mcg Intravenous Daily  . nafcillin IV  2 g Intravenous Q4H  . potassium chloride  10 mEq Intravenous Q1 Hr x 3  . sodium chloride  3 mL Intravenous Q12H    Objective: Vital signs in last 24 hours: Temp:  [97.9 F (36.6 C)-100.5 F (38.1 C)] 98.3 F (36.8 C) (02/12 0400) Pulse Rate:  [97-111] 106 (02/12 0600) Resp:  [17-39] 22 (02/12 0600) BP: (121-157)/(71-101) 130/81 mmHg (02/12 0600) SpO2:  [92 %-96 %] 95 % (02/12 0600) Physical Exam  Constitutional: He is oriented to person only. He appears  well-developed and well-nourished. No distress.  HENT:  Mouth/Throat: Oropharynx is clear and moist. No oropharyngeal exudate.  Cardiovascular: Normal rate, regular rhythm and normal heart sounds. Exam reveals no gallop and no friction rub.  2/6 SEM BH at [facility] Pulmonary/Chest: Effort normal and breath sounds normal. No respiratory distress. He has no wheezes.  Abdominal: Soft. Bowel sounds are normal. He exhibits no distension. There is no tenderness.  Lymphadenopathy:  He has no cervical adenopathy.  Neurological: moving his left leg and left arm  Skin: + splinter hemorrhages.Skin is warm and dry. Scattered petechaie to lower extremities   Lab Results  Recent Labs  09/10/13 0320 09/10/13 0944 09/11/13 0546 09/11/13 0000000  WBC Q000111Q* DUPLICATE REQUEST SEE Q000111Q  --  Q000111Q*  HGB 123456 DUPLICATE REQUEST SEE Q000111Q  --  Q000111Q*  HCT [id number] DUPLICATE REQUEST SEE Q000111Q  --  35.8*  NA 138  --  144  --   K 3.8  --  2.9*  --   CL 103  --  109  --   CO2 23  --  25  --   BUN 23  --  27*  --   CREATININE 1.00  --  1.01  --    Liver Panel  Recent Labs  09/09/13 0545 09/10/13 0320  PROT 5.7* 5.4*  ALBUMIN 2.5* 2.1*  AST  48* 41*  ALT 37 39  ALKPHOS 66 87  BILITOT 0.9 1.0   Microbiology: 2/9 urine cx MSSA 2/9 blood cx MSSA 2/11 blood cx pending MRSA by pcr is negative  Studies/Results: Mr Kizzie Fantasia Contrast  09/09/2013   CLINICAL DATA:  Post aortic valve replacement 2011. Presenting with altered mental status, elevated white count and abnormal CT.  EXAM: MRI HEAD WITHOUT AND WITH CONTRAST  TECHNIQUE: Multiplanar, multiecho pulse sequences of the brain and surrounding structures were obtained without and with intravenous contrast.  CONTRAST:  40mL MULTIHANCE GADOBENATE DIMEGLUMINE 529 MG/ML IV SOLN  COMPARISON:  09/09/2013 and 09/06/2013 CT.  No comparison MR.  FINDINGS: Exam is motion degraded.  Multiple bilateral supra tentorial and right cerebellar regions of restricted motion some  of which are partially hemorrhagic with largest area left parietal lobe. In the present clinical setting, these findings are consistent with acute partially hemorrhagic infarcts and possibly related to septic emboli.  At most there is very minimal enhancement of the left parietal partially hemorrhagic infarct.  No intracranial mass lesion seen separate from the above described findings.  Major intracranial vascular structures are patent.  IMPRESSION: Exam is motion degraded.  Multiple bilateral supra tentorial and right cerebellar regions of restricted motion some of which are partially hemorrhagic with largest area left parietal lobe. In the present clinical setting, these findings are consistent with acute partially hemorrhagic infarcts and possibly related to septic emboli.  These results were called by telephone at the time of interpretation on 09/09/2013 at 11:54 AM to Dr. Algis Liming , who verbally acknowledged these results.   Electronically Signed   By: Chauncey Cruel M.D.   On: 09/09/2013 12:03     Assessment/Plan: 58yo M with bioprosthetic AVR presents with malaise, fever, encephalopathy found to gram positive cocci bacteremia with evidence of hemorrhagic infarct concerning for CNS septic emboli from presumed prosthetic valve endocarditis due to disseminated MSSA disease ( + blood, + urine)  - continue  nafcillin, and gent for MSSA PVE. Will discontinue vancomycin today - await blood cultures from 2/11 to document clearance. Then can place picc line - per cardiology, once platelet recovers then can go onto doing TEE, likely at end of the week - continue to monitor neuro exam to see if any worsening symptoms due to hemorrhagic infarcts noted on mri/nchct  - nstemi due to demand ischemia from infection  [facility], [facility] for Infectious Diseases Cell: [phone] Pager: [phone]  09/11/2013, 8:29 AM

## 2013-09-11 NOTE — Progress Notes (Signed)
Subjective: Patient remains confused.  On antibiotics.    Objective: Current vital signs: BP 144/89  Pulse 104  Temp(Src) 99.9 F (37.7 C) (Oral)  Resp 22  Ht 6\' 1"  (1.854 m)  Wt 96 kg (211 lb 10.3 oz)  BMI 27.93 kg/m2  SpO2 95% Vital signs in last 24 hours: Temp:  [97.9 F (36.6 C)-99.9 F (37.7 C)] 99.9 F (37.7 C) (02/12 0800) Pulse Rate:  [97-111] 104 (02/12 0800) Resp:  [17-39] 22 (02/12 0800) BP: (121-157)/(71-101) 144/89 mmHg (02/12 0800) SpO2:  [92 %-96 %] 95 % (02/12 0800)  Intake/Output from previous day: 02/11 0701 - 02/12 0700 In: 4246.2 [I.V.:3291.7; IV Piggyback:954.5] Out: 600 [Urine:600] Intake/Output this shift: Total I/O In: 250 [I.V.:250] Out: -  Nutritional status: NPO  Neurologic Exam: Mental Status:  Patient is awake. Answers some "yes", "no " questions. He does not follow commands.  Cranial Nerves:  II: Visual Fields are difficult to establish given that he does not blink to threat from either direction or endorse vision, but he does fixate. Pupils are equal, round, and reactive to light. Discs are difficult to visualize.  III,IV, VI: Intact oculocephalic maneuvers V: Facial sensation is symmetric to temperature  VII: Facial movement is symmetric.  VIII: hearing is intact to voice  X, XI, XII: Unable to assess secondary to patient's altered mental status.  Motor:  Patient moves all extremities against gravity Sensory:  Response to noxious stimuli in all 4 extremities  Deep Tendon Reflexes:  2+ and symmetric in the biceps and patellae.  Plantars:  Toes are upgoing bilaterally Cerebellar:  Unable to perform  Gait:  Does not comply   Lab Results: Basic Metabolic Panel:  Recent Labs Lab 09/06/13 2018 09/08/13 2200 09/09/13 0545 09/10/13 0320 09/11/13 0546 09/11/13 0750  NA 136* 130* 134* 138 144  --   K 3.6* 3.1* 3.0* 3.8 2.9*  --   CL 98 92* 98 103 109  --   CO2 23 22 21 23 25   --   GLUCOSE 163* 258* 184* 157* 148*  --    BUN 19 18 18 23  27*  --   CREATININE 0.90 0.76 0.93 1.00 1.01  --   CALCIUM 9.2 8.8 8.0* 8.0* 7.8*  --   MG  --   --   --  2.2  --  2.2    Liver Function Tests:  Recent Labs Lab 09/06/13 2018 09/08/13 2200 09/09/13 0545 09/10/13 0320  AST 35 44* 48* 41*  ALT 31 40 37 39  ALKPHOS 57 87 66 66  BILITOT 0.7 0.7 0.9 1.0  PROT 7.3 7.0 5.7* 5.4*  ALBUMIN 3.6 3.0* 2.5* 2.1*    Recent Labs Lab 09/06/13 2018  LIPASE 18    Recent Labs Lab 09/06/13 2018 09/08/13 2336  AMMONIA 31 39    CBC:  Recent Labs Lab 09/06/13 2018 09/08/13 2200 09/09/13 0545 09/09/13 0958 09/10/13 0320 09/10/13 0944 09/11/13 0750  WBC 17.0* 17.9* 123XX123*  --  Q000111Q* DUPLICATE REQUEST SEE Q000111Q 23.0*  NEUTROABS 15.4*  --   --   --  17.9* PENDING 19.8*  HGB 14.2 15.6 123456  --  123456 DUPLICATE REQUEST SEE Q000111Q 12.7*  HCT 41.3 44.1 0000000  --  [id number] DUPLICATE REQUEST SEE Q000111Q 35.8*  MCV 88.4 86.1 99991111  --  99991111 DUPLICATE REQUEST SEE Q000111Q 84.8  PLT 100* 39* 30* 23* 36*  36* DUPLICATE REQUEST SEE Q000111Q 26*    Cardiac Enzymes:  Recent Labs Lab 09/10/13 0753 09/10/13  1500 09/10/13 2315  TROPONINI 2.26* 2.05* 1.78*    Lipid Panel: No results found for this basename: CHOL, TRIG, HDL, CHOLHDL, VLDL, LDLCALC,  in the last 168 hours  CBG:  Recent Labs Lab 09/10/13 1647 09/10/13 2010 09/10/13 2343 09/11/13 0415 09/11/13 0758  GLUCAP 168* 139* 147* 164* 159*    Microbiology: Results for orders placed during the hospital encounter of 09/08/13  CULTURE, BLOOD (ROUTINE X 2)     Status: None   Collection Time    09/08/13 10:00 PM      Result Value Ref Range Status   Specimen Description BLOOD RIGHT ARM   Final   Special Requests BOTTLES DRAWN AEROBIC AND ANAEROBIC 5CC   Final   Culture  Setup Time     Final   Value: 09/09/2013 03:30     Performed at [facility]   Culture     Final   Value: STAPHYLOCOCCUS AUREUS     Note: RIFAMPIN AND GENTAMICIN SHOULD NOT BE USED AS  SINGLE DRUGS FOR TREATMENT OF STAPH INFECTIONS.     Note: Gram Stain Report Called to,Read Back By and Verified With: NEELY RICHARDSON @ 1220 09/09/13 BY KRAWS     Performed at [facility]   Report Status 09/11/2013 FINAL   Final   Organism ID, Bacteria STAPHYLOCOCCUS AUREUS   Final  URINE CULTURE     Status: None   Collection Time    09/08/13 10:03 PM      Result Value Ref Range Status   Specimen Description URINE, RANDOM   Final   Special Requests NONE   Final   Culture  Setup Time     Final   Value: 09/09/2013 04:59     Performed at [place]     Final   Value: >=100,000 COLONIES/ML     Performed at [facility]   Culture     Final   Value: STAPHYLOCOCCUS AUREUS     Note: RIFAMPIN AND GENTAMICIN SHOULD NOT BE USED AS SINGLE DRUGS FOR TREATMENT OF STAPH INFECTIONS.     Performed at [facility]   Report Status 09/11/2013 FINAL   Final   Organism ID, Bacteria STAPHYLOCOCCUS AUREUS   Final  CULTURE, BLOOD (ROUTINE X 2)     Status: None   Collection Time    09/08/13 11:36 PM      Result Value Ref Range Status   Specimen Description BLOOD RIGHT ANTECUBITAL   Final   Special Requests BOTTLES DRAWN AEROBIC AND ANAEROBIC 5CC   Final   Culture  Setup Time     Final   Value: 09/09/2013 03:29     Performed at [facility]   Culture     Final   Value: STAPHYLOCOCCUS AUREUS     Note: SUSCEPTIBILITIES PERFORMED ON PREVIOUS CULTURE WITHIN THE LAST 5 DAYS.     Note: Gram Stain Report Called to,Read Back By and Verified With:  NEELY RICHARDSON @ 1220 09/09/13 BY KRAWS     Performed at [facility]   Report Status 09/11/2013 FINAL   Final  MRSA PCR SCREENING     Status: None   Collection Time    09/09/13  4:44 AM      Result Value Ref Range Status   MRSA by PCR NEGATIVE  NEGATIVE Final   Comment:            The GeneXpert MRSA Assay (FDA     approved  for NASAL specimens     only), is one component of a     comprehensive  MRSA colonization     surveillance program. It is not     intended to diagnose MRSA     infection nor to guide or     monitor treatment for     MRSA infections.  CULTURE, BLOOD (ROUTINE X 2)     Status: None   Collection Time    09/10/13 11:05 AM      Result Value Ref Range Status   Specimen Description BLOOD RIGHT HAND   Final   Special Requests BOTTLES DRAWN AEROBIC ONLY 5CC   Final   Culture  Setup Time     Final   Value: 09/10/2013 14:22     Performed at [facility]   Culture     Final   Value:        BLOOD CULTURE RECEIVED NO GROWTH TO DATE CULTURE WILL BE HELD FOR 5 DAYS BEFORE ISSUING A FINAL NEGATIVE REPORT     Performed at [facility]   Report Status PENDING   Incomplete  CULTURE, BLOOD (ROUTINE X 2)     Status: None   Collection Time    09/10/13 11:06 AM      Result Value Ref Range Status   Specimen Description BLOOD LEFT HAND   Final   Special Requests BOTTLES DRAWN AEROBIC AND ANAEROBIC 5CC   Final   Culture  Setup Time     Final   Value: 09/10/2013 14:27     Performed at [facility]   Culture     Final   Value:        BLOOD CULTURE RECEIVED NO GROWTH TO DATE CULTURE WILL BE HELD FOR 5 DAYS BEFORE ISSUING A FINAL NEGATIVE REPORT     Performed at [facility]   Report Status PENDING   Incomplete    Coagulation Studies:  Recent Labs  09/09/13 0545 09/09/13 0958 09/10/13 0320  LABPROT 12.8 12.2 14.0  INR 0.98 0.92 1.10    Imaging: [name] [id number] Contrast  09/09/2013   CLINICAL DATA:  Post aortic valve replacement 2011. Presenting with altered mental status, elevated white count and abnormal CT.  EXAM: MRI HEAD WITHOUT AND WITH CONTRAST  TECHNIQUE: Multiplanar, multiecho pulse sequences of the brain and surrounding structures were obtained without and with intravenous contrast.  CONTRAST:  69mL MULTIHANCE GADOBENATE DIMEGLUMINE 529 MG/ML IV SOLN  COMPARISON:  09/09/2013 and 09/06/2013 CT.  No comparison MR.  FINDINGS: Exam is  motion degraded.  Multiple bilateral supra tentorial and right cerebellar regions of restricted motion some of which are partially hemorrhagic with largest area left parietal lobe. In the present clinical setting, these findings are consistent with acute partially hemorrhagic infarcts and possibly related to septic emboli.  At most there is very minimal enhancement of the left parietal partially hemorrhagic infarct.  No intracranial mass lesion seen separate from the above described findings.  Major intracranial vascular structures are patent.  IMPRESSION: Exam is motion degraded.  Multiple bilateral supra tentorial and right cerebellar regions of restricted motion some of which are partially hemorrhagic with largest area left parietal lobe. In the present clinical setting, these findings are consistent with acute partially hemorrhagic infarcts and possibly related to septic emboli.  These results were called by telephone at the time of interpretation on 09/09/2013 at 11:54 AM to Dr. Algis Liming , who verbally acknowledged these results.   Electronically Signed  By: Chauncey Cruel M.D.   On: 09/09/2013 12:03    Medications:  I have reviewed the patient's current medications. Scheduled: . antiseptic oral rinse  15 mL Mouth Rinse q12n4p  . chlorhexidine  15 mL Mouth Rinse BID  . gentamicin  90 mg Intravenous Q12H  . insulin aspart  0-9 Units Subcutaneous 6 times per day  . levothyroxine  87.5 mcg Intravenous Daily  . nafcillin IV  2 g Intravenous Q4H  . potassium chloride  10 mEq Intravenous Q1 Hr x 3  . sodium chloride  3 mL Intravenous Q12H    Assessment/Plan: Patient remains confused.  Likely related to emboli.  MRI of the brain reviewed and shows bilateral partially hemorrhagic infarcts.  Antiplatelet therapy has been discontinued.  Recommendations: 1.  Agree with current management.      LOS: 3 days   Alexis Goodell, MD [facility] [phone] 09/11/2013  10:43 AM

## 2013-09-11 NOTE — Progress Notes (Signed)
SLP Cancellation Note  Patient Details Name: Bradley [name] MRN: [id number] DOB: [birth year]   Cancelled treatment:       Reason Eval/Treat Not Completed: Other (comment) (spoke to RN who reports pt not able to follow directions to participate in swallowing evaluation/po trials today, will follow up next date)  Rn providing nursing care to pt.      Luanna Salk, [facility] SLP [phone]

## 2013-09-11 NOTE — Progress Notes (Signed)
ANTIBIOTIC CONSULT NOTE - FOLLOW UP  Pharmacy Consult for Gentamicin Indication: GPC bacteremia, r/o IE, UTI  No Known Allergies  Patient Measurements: Height: 6\' 1"  (185.4 cm) Weight: 211 lb 10.3 oz (96 kg) IBW/kg (Calculated) : 79.9  Vital Signs: Temp: 99.9 F (37.7 C) (02/12 0800) Temp src: Oral (02/12 0800) BP: 144/89 mmHg (02/12 0800) Pulse Rate: 104 (02/12 0800) Intake/Output from previous day: 02/11 0701 - 02/12 0700 In: 4246.2 [I.V.:3291.7; IV Piggyback:954.5] Out: 600 [Urine:600] Intake/Output from this shift: Total I/O In: 250 [I.V.:250] Out: -   Labs:  Recent Labs  09/09/13 0545  09/10/13 0320 09/10/13 0944 09/11/13 0546 09/11/13 0750  WBC 123XX123*  --  Q000111Q* DUPLICATE REQUEST SEE Q000111Q  --  23.0*  HGB 123456  --  123456 DUPLICATE REQUEST SEE Q000111Q  --  12.7*  PLT 30*  < > 36*  36* DUPLICATE REQUEST SEE Q000111Q  --  26*  CREATININE 0.93  --  1.00  --  1.01  --   < > = values in this interval not displayed. Estimated Creatinine Clearance: 99.7 ml/min (by C-G formula based on Cr of 1.01).  Recent Labs  09/10/13 1500 09/11/13 0546 09/11/13 0750  VANCOTROUGH 18.7  --   --   GENTTROUGH  --  1.4  --   GENTPEAK  --   --  3.8*     Microbiology: 2/9 blood x 2: 2/2 MSSA 2/9 urine: MSSA 2/11 blood x 2: sent  Assessment: 89 yoM with history of aortic stenosis status post bioprosthetic valve who presents with AMS and abnormal head CT.  Found with WBC 18.2, fever, and CXR concern for PNA.  Concern for infective endocarditis and septic emboli in pt with prosthetic valve.  Blood cultures growing GPC in clusters.  Urine growing staph aureus.  ID consulted.  Cardiology consulted.  Day #3 Nafcillin 2g IV q4h, and Gentamicin 90 mg IV q8h (for synergy).  Vancomycin discontinued by ID when cultures resulted as MSSA.  Tmax24h 99.9, WBC increasing (23.0).  Renal function appears stable.  SCr 1.0, CrCl~83 ml/min (normalized), ~100 ml/min (CG)  TTE ECHO did not show  vegetation.  Plan is for TEE when platelets recover.  Gentamicin trough 1.4 (drawn 30 minutes prior to dose), Gentamicin peak 3.8 (drawn 1 hour following end of infusion; ideally drawn 30-45 minutes after end of infusion but will be able to extrapolate to peak).  Second set of blood cultures collected 2/11 to document clearance.  Goal of Therapy:  Gentamicin peak 3-4 mg/L and trough <1 mg/L (synergy goals)  Plan:  1.  Adjust gentamicin to 90 mg IV q12h. 2.  Will continue to follow SCr, culture results, clinical course, and drug levels as needed.  Hershal Coria 09/11/2013,9:37 AM

## 2013-09-11 NOTE — Progress Notes (Signed)
       Patient Name: Bradley [name] Date of Encounter: 09/11/2013    SUBJECTIVE: The patient is unable to carry on conversation. The patient is been stable overnight other than cognitive impairment. Vital signs have become more stable.  TELEMETRY:  Sinus tachycardia: Filed Vitals:   09/11/13 0300 09/11/13 0400 09/11/13 0500 09/11/13 0600  BP:  132/91  130/81  Pulse: 103 103 107 106  Temp:  98.3 F (36.8 C)    TempSrc:  Axillary    Resp: 19 23 18 22   Height:      Weight:      SpO2: 95% 93% 93% 95%    Intake/Output Summary (Last 24 hours) at 09/11/13 0812 Last data filed at 09/11/13 0600  Gross per 24 hour  Intake 4246.17 ml  Output    600 ml  Net 3646.17 ml    LABS: Basic Metabolic Panel:  Recent Labs  09/10/13 0320 09/11/13 0546  NA 138 144  K 3.8 2.9*  CL 103 109  CO2 23 25  GLUCOSE 157* 148*  BUN 23 27*  CREATININE 1.00 1.01  CALCIUM 8.0* 7.8*  MG 2.2  --    CBC:  Recent Labs  09/10/13 0944 [id number] 0000000  WBC DUPLICATE REQUEST SEE Q000111Q 23.0*  NEUTROABS PENDING PENDING  HGB DUPLICATE REQUEST SEE Q000111Q Q000111Q*  HCT DUPLICATE REQUEST SEE Q000111Q 0000000*  MCV DUPLICATE REQUEST SEE Q000111Q [id number]  PLT DUPLICATE REQUEST SEE Q000111Q PENDING   BNP    Component Value Date/Time   PROBNP 37349.0* 09/10/2013 0753    Cardiac Enzymes:  Recent Labs  09/10/13 0753 09/10/13 1500 09/10/13 2315  TROPONINI 2.26* 2.05* 1.78*   BNP: No components found with this basename: POCBNP,  Hemoglobin A1C:  Recent Labs  09/09/13 0545  HGBA1C 5.7*   Fasting Lipid Panel: No results found for this basename: CHOL, HDL, LDLCALC, TRIG, CHOLHDL, LDLDIRECT,  in the last 72 hours  Radiology/Studies:  No new radiologic data is available  Physical Exam: Blood pressure 130/81, pulse 106, temperature 98.3 F (36.8 C), temperature source Axillary, resp. rate 22, height 6\' 1"  (1.854 m), weight 211 lb 10.3 oz (96 kg), SpO2 95.00%. Weight change:    There is a 2/6 systolic  murmur in the right upper sternal border. No diastolic murmur.  The patient is arousable but incoherent and not able to engage in conversation  ASSESSMENT:  1. Bacteremia and suspected endocarditis. Clinical improvement with reference to vital signs and temperature since initiation of antibiotic therapy and with support 2. Cognitive impairment/encephalopathy related to suspected cardioembolic phenomena 3. Thrombocytopenia 4. Acute diastolic heart failure, based upon markedly elevated BNP 5. Non-ST elevation MI, type II  Plan:  1. The patient will be prone to pulmonary edema if we over hydrate 2. Still unable to perform TEE as we await thrombocytopenia to improve 3. No ischemic evaluation is necessary as elevated troponins are felt to be secondary to demand/supply mismatch in setting of hypotension  Signed, Sinclair Grooms 09/11/2013, 8:12 AM

## 2013-09-12 ENCOUNTER — Inpatient Hospital Stay (HOSPITAL_COMMUNITY): Payer: Medicaid Other

## 2013-09-12 DIAGNOSIS — R0682 Tachypnea, not elsewhere classified: Secondary | ICD-10-CM

## 2013-09-12 DIAGNOSIS — E870 Hyperosmolality and hypernatremia: Secondary | ICD-10-CM

## 2013-09-12 DIAGNOSIS — R5381 Other malaise: Secondary | ICD-10-CM

## 2013-09-12 DIAGNOSIS — R451 Restlessness and agitation: Secondary | ICD-10-CM | POA: Diagnosis present

## 2013-09-12 DIAGNOSIS — I248 Other forms of acute ischemic heart disease: Secondary | ICD-10-CM

## 2013-09-12 DIAGNOSIS — R5383 Other fatigue: Secondary | ICD-10-CM

## 2013-09-12 LAB — GLUCOSE, CAPILLARY
Glucose-Capillary: 128 mg/dL — ABNORMAL HIGH (ref 70–99)
Glucose-Capillary: 119 mg/dL — ABNORMAL HIGH (ref 70–99)
Glucose-Capillary: 144 mg/dL — ABNORMAL HIGH (ref 70–99)
Glucose-Capillary: 169 mg/dL — ABNORMAL HIGH (ref 70–99)
Glucose-Capillary: 208 mg/dL — ABNORMAL HIGH (ref 70–99)
Glucose-Capillary: 157 mg/dL — ABNORMAL HIGH (ref 70–99)

## 2013-09-12 LAB — COMPREHENSIVE METABOLIC PANEL
ALT: 31 U/L (ref 0–53)
ALT: 30 U/L (ref 0–53)
AST: 26 U/L (ref 0–37)
AST: 27 U/L (ref 0–37)
Albumin: 2 g/dL — ABNORMAL LOW (ref 3.5–5.2)
Albumin: 1.9 g/dL — ABNORMAL LOW (ref 3.5–5.2)
Alkaline Phosphatase: 71 U/L (ref 39–117)
Alkaline Phosphatase: 73 U/L (ref 39–117)
BUN: 33 mg/dL — ABNORMAL HIGH (ref 6–23)
BUN: 34 mg/dL — ABNORMAL HIGH (ref 6–23)
CO2: 23 mEq/L (ref 19–32)
CO2: 24 mEq/L (ref 19–32)
Calcium: 8.1 mg/dL — ABNORMAL LOW (ref 8.4–10.5)
Calcium: 8 mg/dL — ABNORMAL LOW (ref 8.4–10.5)
Chloride: 114 mEq/L — ABNORMAL HIGH (ref 96–112)
Chloride: 112 mEq/L (ref 96–112)
Creatinine, Ser: 1.19 mg/dL (ref 0.50–1.35)
Creatinine, Ser: 1.31 mg/dL (ref 0.50–1.35)
GFR calc Af Amer: 77 mL/min — ABNORMAL LOW (ref >90)
GFR calc Af Amer: 69 mL/min — ABNORMAL LOW (ref >90)
GFR calc non Af Amer: 67 mL/min — ABNORMAL LOW (ref >90)
GFR calc non Af Amer: 59 mL/min — ABNORMAL LOW (ref >90)
Glucose, Bld: 147 mg/dL — ABNORMAL HIGH (ref 70–99)
Glucose, Bld: 255 mg/dL — ABNORMAL HIGH (ref 70–99)
Potassium: 2.8 mEq/L — CL (ref 3.7–5.3)
Potassium: 2.8 mEq/L — CL (ref 3.7–5.3)
Sodium: 152 mEq/L — ABNORMAL HIGH (ref 137–147)
Sodium: 148 mEq/L — ABNORMAL HIGH (ref 137–147)
Total Bilirubin: 1.4 mg/dL — ABNORMAL HIGH (ref 0.3–1.2)
Total Bilirubin: 1.3 mg/dL — ABNORMAL HIGH (ref 0.3–1.2)
Total Protein: 5.5 g/dL — ABNORMAL LOW (ref 6.0–8.3)
Total Protein: 5.3 g/dL — ABNORMAL LOW (ref 6.0–8.3)

## 2013-09-12 LAB — BLOOD GAS, ARTERIAL
Acid-Base Excess: 3.1 mmol/L — ABNORMAL HIGH (ref 0.0–2.0)
Bicarbonate: 24.6 mEq/L — ABNORMAL HIGH (ref 20.0–24.0)
FIO2: 0.21 %
O2 Saturation: 94.6 %
Patient temperature: 98.6
TCO2: 21.5 mmol/L (ref 0–100)
pCO2 arterial: 28.7 mmHg — ABNORMAL LOW (ref 35.0–45.0)
pH, Arterial: 7.543 — ABNORMAL HIGH (ref 7.350–7.450)
pO2, Arterial: 63.1 mmHg — ABNORMAL LOW (ref 80.0–100.0)

## 2013-09-12 LAB — SODIUM
Sodium: 151 mEq/L — ABNORMAL HIGH (ref 137–147)
Sodium: 152 mEq/L — ABNORMAL HIGH (ref 137–147)
Sodium: 149 mEq/L — ABNORMAL HIGH (ref 137–147)

## 2013-09-12 LAB — CBC WITH DIFFERENTIAL/PLATELET
Basophils Absolute: 0.3 10*3/uL — ABNORMAL HIGH (ref 0.0–0.1)
Basophils Relative: 1 % (ref 0–1)
Eosinophils Absolute: 0 10*3/uL (ref 0.0–0.7)
Eosinophils Relative: 0 % (ref 0–5)
HCT: 37.7 % — ABNORMAL LOW (ref 39.0–52.0)
Hemoglobin: 13.4 g/dL (ref 13.0–17.0)
Lymphocytes Relative: 8 % — ABNORMAL LOW (ref 12–46)
Lymphs Abs: 2.2 10*3/uL (ref 0.7–4.0)
MCH: 30 pg (ref 26.0–34.0)
MCHC: 35.5 g/dL (ref 30.0–36.0)
MCV: 84.5 fL (ref 78.0–100.0)
Monocytes Absolute: 1.9 10*3/uL — ABNORMAL HIGH (ref 0.1–1.0)
Monocytes Relative: 7 % (ref 3–12)
Neutro Abs: 22.5 10*3/uL — ABNORMAL HIGH (ref 1.7–7.7)
Neutrophils Relative %: 84 % — ABNORMAL HIGH (ref 43–77)
Platelets: 48 10*3/uL — ABNORMAL LOW (ref 150–400)
RBC: 4.46 MIL/uL (ref 4.22–5.81)
RDW: 15 % (ref 11.5–15.5)
WBC: 26.9 10*3/uL — ABNORMAL HIGH (ref 4.0–10.5)

## 2013-09-12 LAB — CK: Total CK: 122 U/L (ref 7–232)

## 2013-09-12 LAB — TROPONIN I
Troponin I: 1.52 ng/mL (ref <0.30)
Troponin I: 1.54 ng/mL (ref <0.30)
Troponin I: 1.39 ng/mL (ref <0.30)

## 2013-09-12 LAB — POTASSIUM: Potassium: 2.9 mEq/L — CL (ref 3.7–5.3)

## 2013-09-12 LAB — PROTIME-INR
INR: 1.23 (ref 0.00–1.49)
Prothrombin Time: 15.2 seconds (ref 11.6–15.2)

## 2013-09-12 LAB — MAGNESIUM: Magnesium: 2.4 mg/dL (ref 1.5–2.5)

## 2013-09-12 MED ORDER — DEXTROSE 5 % IV BOLUS
1000.0000 mL | Freq: Once | INTRAVENOUS | Status: AC
  Administered 2013-09-12: 1000 mL via INTRAVENOUS

## 2013-09-12 MED ORDER — MORPHINE SULFATE 2 MG/ML IJ SOLN
1.0000 mg | INTRAMUSCULAR | Status: DC | PRN
  Administered 2013-09-12 – 2013-10-08 (×13): 1 mg via INTRAVENOUS
  Filled 2013-09-12 (×13): qty 1

## 2013-09-12 MED ORDER — LORAZEPAM 2 MG/ML IJ SOLN
1.0000 mg | Freq: Once | INTRAMUSCULAR | Status: AC
  Administered 2013-09-12: 1 mg via INTRAVENOUS
  Filled 2013-09-12: qty 1

## 2013-09-12 MED ORDER — DEXTROSE 5 % IV BOLUS
500.0000 mL | Freq: Once | INTRAVENOUS | Status: AC
  Administered 2013-09-12: 500 mL via INTRAVENOUS

## 2013-09-12 MED ORDER — POTASSIUM CHLORIDE 10 MEQ/100ML IV SOLN
10.0000 meq | INTRAVENOUS | Status: AC
  Administered 2013-09-12 – 2013-09-13 (×5): 10 meq via INTRAVENOUS
  Filled 2013-09-12 (×4): qty 100

## 2013-09-12 MED ORDER — POTASSIUM CHLORIDE 10 MEQ/100ML IV SOLN: 10.0000 meq | INTRAVENOUS | Status: DC

## 2013-09-12 MED ORDER — HALOPERIDOL LACTATE 5 MG/ML IJ SOLN
4.0000 mg | Freq: Four times a day (QID) | INTRAMUSCULAR | Status: DC | PRN
  Administered 2013-09-12 – 2013-09-16 (×5): 4 mg via INTRAVENOUS
  Filled 2013-09-12 (×5): qty 1

## 2013-09-12 MED ORDER — LORAZEPAM 2 MG/ML IJ SOLN
1.0000 mg | Freq: Once | INTRAMUSCULAR | Status: AC
  Administered 2013-09-12: 2 mg via INTRAVENOUS
  Filled 2013-09-12 (×2): qty 2

## 2013-09-12 MED ORDER — DEXTROSE 5 % IV SOLN
INTRAVENOUS | Status: DC
  Administered 2013-09-12: 100 mL via INTRAVENOUS
  Administered 2013-09-12 (×2): via INTRAVENOUS
  Administered 2013-09-13: 150 mL via INTRAVENOUS
  Administered 2013-09-14 – 2013-09-17 (×3): via INTRAVENOUS
  Administered 2013-09-19: 30 mL via INTRAVENOUS

## 2013-09-12 MED ORDER — POTASSIUM CHLORIDE 10 MEQ/100ML IV SOLN
10.0000 meq | INTRAVENOUS | Status: AC
  Administered 2013-09-12 (×3): 10 meq via INTRAVENOUS
  Filled 2013-09-12 (×4): qty 100

## 2013-09-12 NOTE — Progress Notes (Signed)
COURTESY NOTE:  Mr Burel's platelets are turning around:  Results for AYLAN, PARTINGTON (MRN [id number]) as of 09/12/2013 16:07  Ref. Range 09/10/2013 03:20 09/10/2013 09:44 09/11/2013 07:50 09/11/2013 17:03 09/12/2013 14:20  Platelets Latest Range: [id number] K/uL 36 (L) DUPLICATE REQUEST SEE Q000111Q 26 (LL) 35 (L) 48 (L)   No need for hematologic intervention. Please reconsult if needed. Will sign off at this time.

## 2013-09-12 NOTE — Progress Notes (Signed)
Gave patient an ice chip per Margarita Grizzle, [place].  Patient did not appear interested in the ice chip.  Coughed when ice chip placed in his mouth.  Notified SLP.

## 2013-09-12 NOTE — Progress Notes (Addendum)
[facility] for Infectious [facility]    Date of Admission:  09/08/2013   Total days of antibiotics 4        Day 4 vanco        Day 4 gent        Day 3 nafcillin   ID: Bradley [name] is a 58 y.o. 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  Principal Problem:   Sepsis Active Problems:   HYPOTHYROIDISM- TSH 7.5   Aortic valve replaced- Bentall propceedure 2011   Hypertension   Depression   LBBB (left bundle branch block)   Major depressive disorder, recurrent, severe with psychotic features- hospitalized in Jan 2013   Generalized anxiety disorder   Altered mental state   Encephalitis   Septic embolism   HCAP (healthcare-associated pneumonia)   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   Acute diastolic heart failure   NSTEMI (non-ST elevated myocardial infarction)   Agitation    Subjective: tmax yesterday up to 101.60F, still encephalopathic, worsening in the last 1-2 days  24hr events = blood work shows serum sodium of 151, low K+  Medications:  . antiseptic oral rinse  15 mL Mouth Rinse q12n4p  . chlorhexidine  15 mL Mouth Rinse BID  . gentamicin  90 mg Intravenous Q12H  . insulin aspart  0-9 Units Subcutaneous 6 times per day  . levothyroxine  87.5 mcg Intravenous Daily  . nafcillin IV  2 g Intravenous Q4H  . sodium chloride  3 mL Intravenous Q12H    Objective: Vital signs in last 24 hours: Temp:  [98.6 F (37 C)-101.2 F (38.4 C)] 99.6 F (37.6 C) (02/13 1200) Pulse Rate:  [93-121] 106 (02/13 1200) Resp:  [7-39] 21 (02/13 1200) BP: (127-169)/(85-115) 168/102 mmHg (02/13 1200) SpO2:  [93 %-100 %] 97 % (02/13 1200) Weight:  [210 lb 12.2 oz (95.6 kg)] 210 lb 12.2 oz (95.6 kg) (02/13 0400) Physical Exam  Constitutional: He is thrashing in bed. No oriented to self. No distress.  HENT:  Mouth/Throat: Oropharynx is clear and moist. No oropharyngeal exudate.  Cardiovascular: tachycaridc, regular rhythm and normal heart sounds. Exam reveals  no gallop and no friction rub.  2/6 SEM BH at [facility] Pulmonary/Chest: Effort normal and breath sounds normal. No respiratory distress. He has no wheezes.  Abdominal: Soft. Bowel sounds are normal. He exhibits no distension. There is no tenderness.  Lymphadenopathy:  He has no cervical adenopathy.  Neurological: moving all extremities equally, not following commands  Skin: + splinter hemorrhages.Skin is warm and dry. Scattered petechaie to lower extremities   Lab Results  Recent Labs  09/11/13 0546 09/11/13 0750 09/11/13 1328 09/11/13 1703 09/12/13 0346 09/12/13 0913  WBC  --  23.0*  --  23.2*  --   --   HGB  --  12.7*  --  13.0  --   --   HCT  --  35.8*  --  36.6*  --   --   NA 144  --   --   --  152* 151*  K 2.9*  --  3.0*  --  2.8*  --   CL 109  --   --   --  114*  --   CO2 25  --   --   --  23  --   BUN 27*  --   --   --  33*  --   CREATININE 1.01  --   --   --  1.19  --    Liver Panel  Recent Labs  09/10/13 0320 09/12/13 0346  PROT 5.4* 5.5*  ALBUMIN 2.1* 2.0*  AST 41* 26  ALT 39 31  ALKPHOS 66 71  BILITOT 1.0 1.4*   Microbiology: 2/9 urine cx MSSA 2/9 blood cx MSSA 2/11 blood cx pending MRSA by pcr is negative  Studies/Results: No results found.   Assessment/Plan: 58yo M with bioprosthetic AVR presents with malaise, fever, encephalopathy found to gram positive cocci bacteremia with evidence of hemorrhagic infarct concerning for CNS septic emboli from presumed prosthetic valve endocarditis due to disseminated MSSA disease ( + blood, + urine)  1) MSSA PVE - continue  nafcillin, and gent for MSSA PVE will need 6 wks of therapy -  blood cultures from 2/11 remain NGTD documenting clearance. Then can place picc line - per cardiology, once platelet recovers then can go onto doing TEE  2) hypernatremia = could be possibly due to cerebral salt wasting associated with stroke/cardioembolic phenomenon adn wondering if it is contributing to his encephalopathy  worsening. Would check BMP BID to ensure, it does not correct quickly to avoid central potine myelosis - consider repeating NCHCT to see if worsening hemorrhagic process and continue to monitor neuro exam to see if it correlates to him having worsening altered mental status  3) tachypnea and labored breathing ,I have abg is ordered. Will relay information to patient's status to Dr. Sherral Hammers  4) nstemi due to demand ischemia from infection, being followed by Cardiology  Dr .Megan Salon to provide recs on [place], [facility] Cell: [phone] Pager: [phone]  09/12/2013, 2:28 PM

## 2013-09-12 NOTE — Progress Notes (Addendum)
[facility] PROGRESS NOTE  Bradley [name] [id number] DOB: 1955-12-22 DOA: 09/08/2013 PCP: [name], MD  Assessment/Plan:  Gram-positive cocci sepsis/septic brain emboli suspicious for infective endocarditis/? CAP/?? - Patient was admitted to ICU. Blood cultures x2 shows gram-positive cocci in clusters (MSSA).  -. Influenza panel PCR negative.  -Chest x-ray suggests early pneumonia of left lung base with small left pleural effusion.  -Transthoracic echo requested and pending.  -Continue IV vancomycin,  nafcillin and gentamicin.  -Infectious disease consulted. - DC normal saline; D5W 500 mL bolus x1 then start D5W 75ml/hour   UTI -Urine culture: Positive MSSA - continue ABx    Acute encephalopathy  - Most likely secondary to severe sepsis and septic brain emboli.  -Continue antibiotics  -Patient more awake today will order bedside swallow    Severe thrombocytopenia  - Platelet counts have dropped from 100 (09/06/13) > 23 (09/09/13); 2/12= 26. Most likely secondary to severe sepsis. 2/12 INR= 1.2 normal. Fibrinogen 586.  -No schistocytes on peripheral smear.  -DIC panel positive  - HIT panel negative -Hematology consulted.  -No overt bleeding at this time.  -No antiplatelets or heparin products at this time.  -Trend daily CBCs.  -Transfuse if has overt bleeding or platelet count less than 10 K.   Hypokalemia  - 2/13 Replace intravenously -Recheck potassium at 1300 on 2/13   Leukocytosis  - Secondary to sepsis. Trend daily CBCs   Active MI  - 2/11 Troponin =2.26 -Trend Troponin    Systolic and Diastolic CHF - Acute vs Chronic ? -DC normal saline; D5W 500 mL bolus x1 then start D5W 31ml/hour  Status post tissue AVR/LBBB  - Requested transthoracic 2-D echocardiogram.TEE when stable. - Cardiology consulted. TTE Eventually?    History of thyroid cancer, post thyroidectomy hypothyroidism  - Thyroid supplements. Elevated TSH may be secondary to acute  illness. - -  Recommend followup in 4-6 weeks.   Hypertension  - Continue when necessary IV metoprolol. Hold lisinopril.   ? Left hemiparesis/Septic Emboli-hemorrhagic  -  Hyperglycemia  - Hemoglobin A1c = 5.7  -Continue sensitive SSI.   History of depression  - hold Zoloft secondary to acute encephalopathy.  Hyponatremia -DC normal saline -Bolus D5W 5 ML, then D5W at 16ml/hr -Every 4 hour NA check  Agitation -Increase Haldol 4 mg every 6 hour PRN agitation -Start morphine 1 mg every 4 hour PRN pain    Code Status: Full Family Communication:  no family present for discussion of plan of care  Disposition Plan: Resolution of sepsis,   Consultants: Dr Kathrynn Speed (Neurology)  Dr Lurline Del (Oncology) Dr Dorris Carnes (Cardiology)    Procedures: PBS pathology read 09/10/2013 LEUKOCYTOSIS WITH NEUTROPHILIA, AND THROMBOCYTOPENIA.   MRI Brain 09/09/2013 Multiple bilateral supra tentorial and right cerebellar regions of  restricted motion some of which are partially hemorrhagic with  largest area left parietal lobe. In the present clinical setting,  these findings are consistent with acute partially hemorrhagic  infarcts and possibly related to septic emboli.  CT Head W/O Contrast 09/09/2013 1. Increasing edema around linear high attenuation in the left  parietal lobe. Followup MRI with and without contrast is recommended  for further assessment. The patient presents with an infectious  syndrome and septic emboli are in the differential considerations.  Infarct with a small amount of hemorrhage or cortical laminar  necrosis as well as developmental venous anomaly remain in the  differential considerations. MRI should be useful in further  characterization.  2. Dr. Leonel Ramsay called discuss  the case at 0230 hr on the day of  Examination.  Echocardiogram 09/09/2013 - Left ventricle: septal and apical hypokinesis The cavity size was mildly dilated. -  moderate LVH. Systolic function was mildly reduced.  -LVEF=  45% to 50%. - Aortic valve: Apparent tissue AVR. No abscess or vegetation seen.  -No AR or perivalvular leak and normal systolic gradients Suggest TEE if clinical suspicion for SBE high - Mitral valve: Mild regurgitation. - Left atrium: The atrium was mildly dilated.     MRSA Neg PCR     Antibiotics: Gentamicin 2/10>>  Nafcillin 2/10>> Vancomycin 2/10>>2/12     HPI/Subjective: 58 year old male with history of aortic stenosis, status post pediatric aortic valvulotomy at age 41 followed by Bentall procedure 02/2000 with tissue AVR, cardiac cath 02/2010 with normal coronaries, LBBB, thyroid cancer-post thyroidectomy hypothyroidism, HTN and depression has been generally unwell for 2-3 weeks, recently seen in the ED on 09/06/13 and assessed as influenza related vomiting, diarrhea, abdominal pain, body aches, fever and mild confusion. Family was given option of hospitalization versus discharge home-family opted to go home and he was discharged on Tamiflu. However, patient worsened at home with worsening confusion/disorientation and was admitted with sepsis, septic brain embolization and rule out infective endocarditis. Family recently acquired a dog? Stray- 3 weeks ago. 2/11 states negative pain/SOB 2/12 patient A./O. x1, will follow some commands. Becomes agitated as the day progresses 2/13 no acute events overnight, patient has had increased agitation and danger of pulling out Foley/IV lines      Objective: Filed Vitals:   09/12/13 0300 09/12/13 0400 09/12/13 0500 09/12/13 0600  BP:  159/85  142/95  Pulse: 121 118 118 110  Temp:  101 F (38.3 C)    TempSrc:  Rectal    Resp: 7 24 17 16   Height:      Weight:  95.6 kg (210 lb 12.2 oz)    SpO2: 96% 93% 97% 96%    Intake/Output Summary (Last 24 hours) at 09/12/13 0825 Last data filed at 09/12/13 [id number]  Gross per 24 hour  Intake   1650 ml  Output    875 ml  Net    775 ml    Filed Weights   09/09/13 0337 09/09/13 0500 09/12/13 0400  Weight: 90.719 kg (200 lb) 96 kg (211 lb 10.3 oz) 95.6 kg (210 lb 12.2 oz)    Exam:   General: A./O. x1 (does not know where, when, why), NAD, cooperative with some commands  Cardiovascular: Regular rhythm and rate, positive systolic murmur 2/5, negative rubs or gallops, DP/PT pulse one plus bilateral  Respiratory: Clear to auscultation bilateral  Abdomen: Soft, nontender, nondistended, plus bowel  Musculoskeletal: Negative pedal edema, negative ecchymosis  Neurologic; cranial nerves II through XII intact,unable to track objects with eyes, bilateral upper/lower extremity weakness LUE/LLE 2/5, RUE/RLE 3/5,  moves all extremities spontaneously, sensation intact throughout, positive Babinski on the left foot, did not attempt to ambulate.  Data Reviewed: Basic Metabolic Panel:  Recent Labs Lab 09/08/13 2200 09/09/13 0545 09/10/13 0320 09/11/13 0546 09/11/13 0750 09/11/13 1328 09/12/13 0346  NA 130* 134* 138 144  --   --  152*  K 3.1* 3.0* 3.8 2.9*  --  3.0* 2.8*  CL 92* 98 103 109  --   --  114*  CO2 22 21 23 25   --   --  23  GLUCOSE 258* 184* 157* 148*  --   --  147*  BUN 18 18 23  27*  --   --  33*  CREATININE 0.76 0.93 1.00 1.01  --   --  1.19  CALCIUM 8.8 8.0* 8.0* 7.8*  --   --  8.1*  MG  --   --  2.2  --  2.2  --   --    Liver Function Tests:  Recent Labs Lab 09/06/13 2018 09/08/13 2200 09/09/13 0545 09/10/13 0320 09/12/13 0346  AST 35 44* 48* 41* 26  ALT 31 40 37 39 31  ALKPHOS 57 87 66 66 71  BILITOT 0.7 0.7 0.9 1.0 1.4*  PROT 7.3 7.0 5.7* 5.4* 5.5*  ALBUMIN 3.6 3.0* 2.5* 2.1* 2.0*    Recent Labs Lab 09/06/13 2018  LIPASE 18    Recent Labs Lab 09/06/13 2018 09/08/13 2336  AMMONIA 31 39   CBC:  Recent Labs Lab 09/06/13 2018  09/09/13 0545 09/09/13 0958 09/10/13 0320 09/10/13 0944 09/11/13 0750 09/11/13 1703  WBC 17.0*  < > 123XX123*  --  Q000111Q* DUPLICATE REQUEST SEE Q000111Q  23.0* 23.2*  NEUTROABS 15.4*  --   --   --  17.9* PENDING 19.8* 19.5*  HGB 14.2  < > 123456  --  123456 DUPLICATE REQUEST SEE Q000111Q 12.7* 13.0  HCT 41.3  < > 0000000  --  [id number] DUPLICATE REQUEST SEE Q000111Q 35.8* 36.6*  MCV 88.4  < > 99991111  --  99991111 DUPLICATE REQUEST SEE Q000111Q 84.8 83.8  PLT 100*  < > 30* 23* 36*  36* DUPLICATE REQUEST SEE Q000111Q 26* 35*  < > = values in this interval not displayed. Cardiac Enzymes:  Recent Labs Lab 09/10/13 0753 09/10/13 1500 09/10/13 2315  TROPONINI 2.26* 2.05* 1.78*   BNP (last 3 results)  Recent Labs  09/10/13 0753  PROBNP 37349.0*   CBG:  Recent Labs Lab 09/11/13 1708 09/11/13 2038 09/12/13 0018 09/12/13 0331 09/12/13 0737  GLUCAP 133* 132* 128* 119* 144*    Recent Results (from the past 240 hour(s))  CULTURE, BLOOD (ROUTINE X 2)     Status: None   Collection Time    09/08/13 10:00 PM      Result Value Ref Range Status   Specimen Description BLOOD RIGHT ARM   Final   Special Requests BOTTLES DRAWN AEROBIC AND ANAEROBIC 5CC   Final   Culture  Setup Time     Final   Value: 09/09/2013 03:30     Performed at [facility]   Culture     Final   Value: STAPHYLOCOCCUS AUREUS     Note: RIFAMPIN AND GENTAMICIN SHOULD NOT BE USED AS SINGLE DRUGS FOR TREATMENT OF STAPH INFECTIONS.     Note: Gram Stain Report Called to,Read Back By and Verified With: NEELY RICHARDSON @ 1220 09/09/13 BY KRAWS     Performed at [facility]   Report Status 09/11/2013 FINAL   Final   Organism ID, Bacteria STAPHYLOCOCCUS AUREUS   Final  URINE CULTURE     Status: None   Collection Time    09/08/13 10:03 PM      Result Value Ref Range Status   Specimen Description URINE, RANDOM   Final   Special Requests NONE   Final   Culture  Setup Time     Final   Value: 09/09/2013 04:59     Performed at [place]     Final   Value: >=100,000 COLONIES/ML     Performed at [facility]   Culture     Final   Value:  STAPHYLOCOCCUS AUREUS     Note: RIFAMPIN AND GENTAMICIN SHOULD NOT BE USED AS SINGLE DRUGS FOR TREATMENT OF STAPH INFECTIONS.     Performed at [facility]   Report Status 09/11/2013 FINAL   Final   Organism ID, Bacteria STAPHYLOCOCCUS AUREUS   Final  CULTURE, BLOOD (ROUTINE X 2)     Status: None   Collection Time    09/08/13 11:36 PM      Result Value Ref Range Status   Specimen Description BLOOD RIGHT ANTECUBITAL   Final   Special Requests BOTTLES DRAWN AEROBIC AND ANAEROBIC 5CC   Final   Culture  Setup Time     Final   Value: 09/09/2013 03:29     Performed at [facility]   Culture     Final   Value: STAPHYLOCOCCUS AUREUS     Note: SUSCEPTIBILITIES PERFORMED ON PREVIOUS CULTURE WITHIN THE LAST 5 DAYS.     Note: Gram Stain Report Called to,Read Back By and Verified With:  NEELY RICHARDSON @ 1220 09/09/13 BY KRAWS     Performed at [facility]   Report Status 09/11/2013 FINAL   Final  MRSA PCR SCREENING     Status: None   Collection Time    09/09/13  4:44 AM      Result Value Ref Range Status   MRSA by PCR NEGATIVE  NEGATIVE Final   Comment:            The GeneXpert MRSA Assay (FDA     approved for NASAL specimens     only), is one component of a     comprehensive MRSA colonization     surveillance program. It is not     intended to diagnose MRSA     infection nor to guide or     monitor treatment for     MRSA infections.  CULTURE, BLOOD (ROUTINE X 2)     Status: None   Collection Time    09/10/13 11:05 AM      Result Value Ref Range Status   Specimen Description BLOOD RIGHT HAND   Final   Special Requests BOTTLES DRAWN AEROBIC ONLY 5CC   Final   Culture  Setup Time     Final   Value: 09/10/2013 14:22     Performed at [facility]   Culture     Final   Value:        BLOOD CULTURE RECEIVED NO GROWTH TO DATE CULTURE WILL BE HELD FOR 5 DAYS BEFORE ISSUING A FINAL NEGATIVE REPORT     Performed at [facility]   Report Status PENDING    Incomplete  CULTURE, BLOOD (ROUTINE X 2)     Status: None   Collection Time    09/10/13 11:06 AM      Result Value Ref Range Status   Specimen Description BLOOD LEFT HAND   Final   Special Requests BOTTLES DRAWN AEROBIC AND ANAEROBIC 5CC   Final   Culture  Setup Time     Final   Value: 09/10/2013 14:27     Performed at [facility]   Culture     Final   Value:        BLOOD CULTURE RECEIVED NO GROWTH TO DATE CULTURE WILL BE HELD FOR 5 DAYS BEFORE ISSUING A FINAL NEGATIVE REPORT     Performed at [facility]   Report Status PENDING   Incomplete     Studies: No results found.  Scheduled Meds: . antiseptic oral rinse  15 mL Mouth Rinse q12n4p  . chlorhexidine  15 mL Mouth Rinse BID  . gentamicin  90 mg Intravenous Q12H  . insulin aspart  0-9 Units Subcutaneous 6 times per day  . levothyroxine  87.5 mcg Intravenous Daily  . nafcillin IV  2 g Intravenous Q4H  . potassium chloride  10 mEq Intravenous Q1 Hr x 4  . sodium chloride  3 mL Intravenous Q12H   Continuous Infusions: . sodium chloride 75 mL/hr at 09/12/13 0428    Principal Problem:   Sepsis Active Problems:   HYPOTHYROIDISM- TSH 7.5   Aortic valve replaced- Bentall propceedure 2011   Hypertension   Depression   LBBB (left bundle branch block)   Major depressive disorder, recurrent, severe with psychotic features- hospitalized in Jan 2013   Generalized anxiety disorder   Altered mental state   Encephalitis   Septic embolism   HCAP (healthcare-associated pneumonia)   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   Acute diastolic heart failure   NSTEMI (non-ST elevated myocardial infarction)    Time spent: 17min    Romyn Boswell, [place], J  [facility] Pager [phone]. If 7PM-7AM, please contact night-coverage at [email or web address], password [facility] 09/12/2013, 8:25 AM  LOS: 4 days

## 2013-09-12 NOTE — Progress Notes (Signed)
SLP Cancellation Note  Patient Details Name: Bradley [name] MRN: [id number] DOB: 1956-04-11   Cancelled treatment:        Attempted to see patient for swallow evaluation.  Nursing reports patient is unable to cooperate/participate at this time.  Pt. In bed moving about, attempting to get OOB.  Family in room.  Will reattempt 12/14.  Asked RN to attempt an ice chip if pt. Becomes more cooperative and to document his response in notes.   Quinn Axe T 09/12/2013, 3:31 PM

## 2013-09-12 NOTE — Progress Notes (Addendum)
       Patient Name: Bradley [name] Date of Encounter: 09/12/2013    SUBJECTIVE: He is unable to carry on a conversation. He is thrashing about his bed.  TELEMETRY:  Sinus tachycardia Filed Vitals:   09/12/13 0300 09/12/13 0400 09/12/13 0500 09/12/13 0600  BP:  159/85  142/95  Pulse: 121 118 118 110  Temp:  101 F (38.3 C)    TempSrc:  Rectal    Resp: 7 24 17 16   Height:      Weight:  210 lb 12.2 oz (95.6 kg)    SpO2: 96% 93% 97% 96%    Intake/Output Summary (Last 24 hours) at 09/12/13 0650 Last data filed at 09/12/13 [id number]  Gross per 24 hour  Intake   1900 ml  Output    350 ml  Net   1550 ml    LABS: Basic Metabolic Panel:  Recent Labs  09/10/13 0320 09/11/13 0546 09/11/13 0750 09/11/13 1328 09/12/13 0346  NA 138 144  --   --  152*  K 3.8 2.9*  --  3.0* 2.8*  CL 103 109  --   --  114*  CO2 23 25  --   --  23  GLUCOSE 157* 148*  --   --  147*  BUN 23 27*  --   --  33*  CREATININE 1.00 1.01  --   --  1.19  CALCIUM 8.0* 7.8*  --   --  8.1*  MG 2.2  --  2.2  --   --    CBC:  Recent Labs  09/11/13 0750 09/11/13 1703  WBC 23.0* 23.2*  NEUTROABS 19.8* 19.5*  HGB 12.7* 13.0  HCT 35.8* 36.6*  MCV 84.8 83.8  PLT 26* 35*   Cardiac Enzymes:  Recent Labs  09/10/13 0753 09/10/13 1500 09/10/13 2315  TROPONINI 2.26* 2.05* 1.78*    Radiology/Studies:  No new data  Physical Exam: Blood pressure 142/95, pulse 110, temperature 101 F (38.3 C), temperature source Rectal, resp. rate 16, height 6\' 1"  (1.854 m), weight 210 lb 12.2 oz (95.6 kg), SpO2 96.00%. Weight change:    Cardiac exam is difficult do to patient activity, movement, and chest Posey. There is a systolic 2 of 6 murmur at the right upper sternal border. I do not hear a rub or diastolic murmur. A summation gallop continues to be present.  ASSESSMENT:  1. Suspected bacterial endocarditis on a Bentall bioprosthetic device. 2. At risk for acute diastolic heart failure 3. Altered mental  status/encephalopathy with evidence of cerebral emboli 4. Thrombocytopenia 5. Flat troponin curve suggests type II non-ST elevation MI  Plan:  1. Continue antibiotic therapy and supportive therapy 2. We will continue to follow 3. Transesophageal echo is still unsafe given a low platelet count and the patient's critical illness.  Demetrios Isaacs 09/12/2013, 6:50 AM

## 2013-09-13 ENCOUNTER — Inpatient Hospital Stay (HOSPITAL_COMMUNITY): Payer: Medicaid Other

## 2013-09-13 DIAGNOSIS — A4101 Sepsis due to Methicillin susceptible Staphylococcus aureus: Secondary | ICD-10-CM | POA: Diagnosis present

## 2013-09-13 DIAGNOSIS — G9340 Encephalopathy, unspecified: Secondary | ICD-10-CM | POA: Diagnosis present

## 2013-09-13 DIAGNOSIS — N179 Acute kidney failure, unspecified: Secondary | ICD-10-CM

## 2013-09-13 DIAGNOSIS — IMO0002 Reserved for concepts with insufficient information to code with codable children: Secondary | ICD-10-CM

## 2013-09-13 DIAGNOSIS — E873 Alkalosis: Secondary | ICD-10-CM | POA: Diagnosis present

## 2013-09-13 LAB — COMPREHENSIVE METABOLIC PANEL
ALT: 29 U/L (ref 0–53)
AST: 26 U/L (ref 0–37)
Albumin: 1.8 g/dL — ABNORMAL LOW (ref 3.5–5.2)
Alkaline Phosphatase: 85 U/L (ref 39–117)
BUN: 39 mg/dL — ABNORMAL HIGH (ref 6–23)
CO2: 24 mEq/L (ref 19–32)
Calcium: 7.8 mg/dL — ABNORMAL LOW (ref 8.4–10.5)
Chloride: 110 mEq/L (ref 96–112)
Creatinine, Ser: 1.89 mg/dL — ABNORMAL HIGH (ref 0.50–1.35)
GFR calc Af Amer: 44 mL/min — ABNORMAL LOW (ref >90)
GFR calc non Af Amer: 38 mL/min — ABNORMAL LOW (ref >90)
Glucose, Bld: 161 mg/dL — ABNORMAL HIGH (ref 70–99)
Potassium: 3.1 mEq/L — ABNORMAL LOW (ref 3.7–5.3)
Sodium: 147 mEq/L (ref 137–147)
Total Bilirubin: 1.2 mg/dL (ref 0.3–1.2)
Total Protein: 5.4 g/dL — ABNORMAL LOW (ref 6.0–8.3)

## 2013-09-13 LAB — CBC WITH DIFFERENTIAL/PLATELET
Basophils Absolute: 0.3 10*3/uL — ABNORMAL HIGH (ref 0.0–0.1)
Basophils Relative: 1 % (ref 0–1)
Eosinophils Absolute: 0 10*3/uL (ref 0.0–0.7)
Eosinophils Relative: 0 % (ref 0–5)
HCT: 37 % — ABNORMAL LOW (ref 39.0–52.0)
Hemoglobin: 13 g/dL (ref 13.0–17.0)
Lymphocytes Relative: 5 % — ABNORMAL LOW (ref 12–46)
Lymphs Abs: 1.4 10*3/uL (ref 0.7–4.0)
MCH: 29.8 pg (ref 26.0–34.0)
MCHC: 35.1 g/dL (ref 30.0–36.0)
MCV: 84.9 fL (ref 78.0–100.0)
Monocytes Absolute: 2.2 10*3/uL — ABNORMAL HIGH (ref 0.1–1.0)
Monocytes Relative: 8 % (ref 3–12)
Neutro Abs: 24 10*3/uL — ABNORMAL HIGH (ref 1.7–7.7)
Neutrophils Relative %: 86 % — ABNORMAL HIGH (ref 43–77)
Platelets: 48 10*3/uL — ABNORMAL LOW (ref 150–400)
RBC: 4.36 MIL/uL (ref 4.22–5.81)
RDW: 15.3 % (ref 11.5–15.5)
WBC: 27.9 10*3/uL — ABNORMAL HIGH (ref 4.0–10.5)

## 2013-09-13 LAB — GLUCOSE, CAPILLARY
Glucose-Capillary: 101 mg/dL — ABNORMAL HIGH (ref 70–99)
Glucose-Capillary: 123 mg/dL — ABNORMAL HIGH (ref 70–99)
Glucose-Capillary: 128 mg/dL — ABNORMAL HIGH (ref 70–99)
Glucose-Capillary: 132 mg/dL — ABNORMAL HIGH (ref 70–99)
Glucose-Capillary: 152 mg/dL — ABNORMAL HIGH (ref 70–99)
Glucose-Capillary: 161 mg/dL — ABNORMAL HIGH (ref 70–99)
Glucose-Capillary: 175 mg/dL — ABNORMAL HIGH (ref 70–99)

## 2013-09-13 LAB — BLOOD GAS, ARTERIAL
Acid-Base Excess: 2.4 mmol/L — ABNORMAL HIGH (ref 0.0–2.0)
Acid-Base Excess: 1.4 mmol/L (ref 0.0–2.0)
Acid-Base Excess: 2.5 mmol/L — ABNORMAL HIGH (ref 0.0–2.0)
Bicarbonate: 24 mEq/L (ref 20.0–24.0)
Bicarbonate: 24.2 mEq/L — ABNORMAL HIGH (ref 20.0–24.0)
Bicarbonate: 23.7 mEq/L (ref 20.0–24.0)
Drawn by: 308601
FIO2: 0.21 %
O2 Content: 2 L/min
O2 Content: 2 L/min
O2 Saturation: 96.2 %
O2 Saturation: 95.9 %
O2 Saturation: 95.5 %
Patient temperature: 98.6
Patient temperature: 98.6
Patient temperature: 99.4
TCO2: 20.8 mmol/L (ref 0–100)
TCO2: 21.2 mmol/L (ref 0–100)
TCO2: 20.5 mmol/L (ref 0–100)
pCO2 arterial: 28.9 mmHg — ABNORMAL LOW (ref 35.0–45.0)
pCO2 arterial: 33.6 mmHg — ABNORMAL LOW (ref 35.0–45.0)
pCO2 arterial: 27.8 mmHg — ABNORMAL LOW (ref 35.0–45.0)
pH, Arterial: 7.529 — ABNORMAL HIGH (ref 7.350–7.450)
pH, Arterial: 7.47 — ABNORMAL HIGH (ref 7.350–7.450)
pH, Arterial: 7.543 — ABNORMAL HIGH (ref 7.350–7.450)
pO2, Arterial: 72.4 mmHg — ABNORMAL LOW (ref 80.0–100.0)
pO2, Arterial: 72.4 mmHg — ABNORMAL LOW (ref 80.0–100.0)
pO2, Arterial: 67.3 mmHg — ABNORMAL LOW (ref 80.0–100.0)

## 2013-09-13 LAB — LACTIC ACID, PLASMA: Lactic Acid, Venous: 1.2 mmol/L (ref 0.5–2.2)

## 2013-09-13 LAB — PROCALCITONIN: Procalcitonin: 1.07 ng/mL

## 2013-09-13 LAB — GENTAMICIN LEVEL, TROUGH
Gentamicin Trough: 2.9 ug/mL (ref 0.5–2.0)
Gentamicin Trough: 2.7 ug/mL (ref 0.5–2.0)

## 2013-09-13 LAB — SODIUM
Sodium: 147 mEq/L (ref 137–147)
Sodium: 146 mEq/L (ref 137–147)
Sodium: 145 mEq/L (ref 137–147)
Sodium: 143 mEq/L (ref 137–147)
Sodium: 144 mEq/L (ref 137–147)

## 2013-09-13 LAB — PROTIME-INR
INR: 1.34 (ref 0.00–1.49)
Prothrombin Time: 16.3 seconds — ABNORMAL HIGH (ref 11.6–15.2)

## 2013-09-13 LAB — POTASSIUM: Potassium: 3.5 mEq/L — ABNORMAL LOW (ref 3.7–5.3)

## 2013-09-13 LAB — GENTAMICIN LEVEL, PEAK: Gentamicin Pk: 4.9 ug/mL — ABNORMAL LOW (ref 5.0–10.0)

## 2013-09-13 MED ORDER — POTASSIUM CHLORIDE 10 MEQ/100ML IV SOLN
10.0000 meq | INTRAVENOUS | Status: AC
  Administered 2013-09-13 (×3): 10 meq via INTRAVENOUS
  Filled 2013-09-13 (×3): qty 100

## 2013-09-13 MED ORDER — FUROSEMIDE 10 MG/ML IJ SOLN
20.0000 mg | Freq: Once | INTRAMUSCULAR | Status: AC
  Administered 2013-09-13: 20 mg via INTRAVENOUS
  Filled 2013-09-13: qty 2

## 2013-09-13 MED ORDER — HALOPERIDOL LACTATE 5 MG/ML IJ SOLN: 4.0000 mg | INTRAMUSCULAR | Status: AC

## 2013-09-13 MED ORDER — MORPHINE SULFATE 4 MG/ML IJ SOLN
3.0000 mg | INTRAMUSCULAR | Status: AC
  Administered 2013-09-13: 3 mg via INTRAVENOUS
  Filled 2013-09-13: qty 1

## 2013-09-13 MED ORDER — DEXMEDETOMIDINE HCL IN NACL 200 MCG/50ML IV SOLN
0.4000 ug/kg/h | INTRAVENOUS | Status: DC
  Administered 2013-09-13 (×2): 0.4 ug/kg/h via INTRAVENOUS
  Administered 2013-09-13: 0.5 ug/kg/h via INTRAVENOUS
  Filled 2013-09-13 (×3): qty 50

## 2013-09-13 NOTE — Progress Notes (Signed)
Patient Name: Bradley [name] Date of Encounter: 09/13/2013    SUBJECTIVE:  58 yo with hx of Bentall procedure admitted with altered mental status.  Hx of hypothyroidism, hypertension, and depression. Echo:  Left ventricle: septal and apical hypokinesis The cavity size was mildly dilated. Wall thickness was increased in a pattern of moderate LVH. Systolic function was mildly reduced. The estimated ejection fraction was in the range of 45% to 50%. - Aortic valve: Apparent tissue AVR. No abscess or vegetation seen. No AR or perivalvular leak and normal systolic gradients Suggest TEE if clinical suspicion for SBE high - Mitral valve: Mild regurgitation. - Left atrium: The atrium was mildly dilated.   TELEMETRY:  Sinus tachycardia Filed Vitals:   09/13/13 0100 09/13/13 0200 09/13/13 0400 09/13/13 0600  BP:  161/116 165/99 147/99  Pulse: 110 108 103 104  Temp:   100.4 F (38 C)   TempSrc:   Axillary   Resp: 28 24 26 30   Height:      Weight:   216 lb 7.9 oz (98.2 kg)   SpO2: 100% 95% 98% 100%    Intake/Output Summary (Last 24 hours) at 09/13/13 0857 Last data filed at 09/13/13 0600  Gross per 24 hour  Intake 3502.25 ml  Output    550 ml  Net 2952.25 ml    LABS: Basic Metabolic Panel:  Recent Labs  09/11/13 0750  09/12/13 1730  09/13/13 0603 09/13/13 0726  NA  --   < > 148*  < > 147 146  K  --   < > 2.8*  --  3.1*  --   CL  --   < > 112  --  110  --   CO2  --   < > 24  --  24  --   GLUCOSE  --   < > 255*  --  161*  --   BUN  --   < > 34*  --  39*  --   CREATININE  --   < > 1.31  --  1.89*  --   CALCIUM  --   < > 8.0*  --  7.8*  --   MG 2.2  --  2.4  --   --   --   < > = values in this interval not displayed. CBC:  Recent Labs  09/12/13 1420 09/13/13 0603  WBC 26.9* 27.9*  NEUTROABS 22.5* 24.0*  HGB 13.4 13.0  HCT 37.7* 37.0*  MCV 84.5 84.9  PLT 48* 48*   Cardiac Enzymes:  Recent Labs  09/12/13 0913 09/12/13 1419 09/12/13 1730  09/12/13 2010  CKTOTAL  --   --  122  --   TROPONINI 1.52* 1.54*  --  1.39*    Radiology/Studies:  No new data  Physical Exam: Blood pressure 147/99, pulse 104, temperature 100.4 F (38 C), temperature source Axillary, resp. rate 30, height 6\' 1"  (1.854 m), weight 216 lb 7.9 oz (98.2 kg), SpO2 100.00%. Weight change: 5 lb 11.7 oz (2.6 kg)   Cardiac exam is difficult do to patient activity, movement, and chest Posey. There is a systolic 2 of 6 murmur at the right upper sternal border. I do not hear a rub or diastolic murmur. A summation gallop continues to be present.  ASSESSMENT:  1. Suspected bacterial endocarditis on a Bentall bioprosthetic device. 2. Altered mental status/encephalopathy with evidence of cerebral emboli 3. Thrombocytopenia 4. Flat troponin curve suggests type II non-ST elevation MI  Plan:  1. Continue antibiotic therapy and supportive therapy 2. We will continue to follow 3. Transesophageal echo is still unsafe given a low platelet count and the patient's critical illness.  Continue empiric abx for now.   His prognosis is poor.    Signed, Darden Amber. 09/13/2013, 8:57 AM

## 2013-09-13 NOTE — Progress Notes (Signed)
ANTIBIOTIC CONSULT NOTE - FOLLOW UP  Pharmacy Consult for Gentamicin Indication: MSSA bacteremia, r/o IE, UTI  No Known Allergies  Patient Measurements: Height: 6\' 1"  (185.4 cm) Weight: 216 lb 7.9 oz (98.2 kg) IBW/kg (Calculated) : 79.9  Vital Signs: Temp: 99.4 F (37.4 C) (02/14 1200) Temp src: Axillary (02/14 1200) BP: 142/76 mmHg (02/14 1900) Pulse Rate: 98 (02/14 1900) Intake/Output from previous day: 02/13 0701 - 02/14 0700 In: 3879.5 [I.V.:2200; IV Piggyback:754.5] Out: 550 [Urine:550] Intake/Output from this shift:    Labs:  Recent Labs  09/11/13 1703 09/12/13 0346 09/12/13 1420 09/12/13 1730 09/13/13 0603  WBC 23.2*  --  26.9*  --  27.9*  HGB 13.0  --  13.4  --  13.0  PLT 35*  --  48*  --  48*  CREATININE  --  1.19  --  1.31 1.89*   Estimated Creatinine Clearance: 53.8 ml/min (by C-G formula based on Cr of 1.89).  Recent Labs  09/11/13 0750 09/13/13 0603 09/13/13 0840 09/13/13 1727  GENTTROUGH  --  2.9*  --  2.7*  GENTPEAK 3.8*  --  4.9*  --      Microbiology: 2/9 blood x 2: 2/2 MSSA 2/9 urine: MSSA 2/11 blood x 2: NGTD  Assessment: 89 yoM with history of aortic stenosis status post bioprosthetic aortic valve admitted with septic encephalopathy and likely septic emboli due to presume MSSA endocarditis.  ID and cardiology following.  Day #5 Nafcillin 2g IV q4h, and Gentamicin 90 mg IV q12h (for synergy).    Tmax24h 100.4, WBC increasing (27.9).  Renal function declining.  SCr increasing, now 1.89, CrCl~44 ml/min (normalized), ~60 ml/min (CG)  TTE ECHO did not show vegetation.  Plan is for TEE when platelets recover.  Gentamicin trough 2.7, Gentamicin peak estimated at 5.7 (gent peak resulted at 4.9 but was drawn late at ~2 hours following end of infusion; ideally drawn 30-45 minutes after end of infusion; extrapolated to peak).  Second set of blood cultures collected 2/11 to document clearance and so far show no growth to date.  Goal of  Therapy:  Gentamicin peak 3-4 mg/L and trough <1 mg/L (synergy goals)  Plan:  Continue to hold gentamicin for now and allow levels to trend down.  Estimated half life ~10.5 hours; however, difficult to determine how gentamicin will clear if SCr continues to rise.  Check gentamicin level at 1200 tomorrow and plan to redose when level < 1.   Hershal Coria 09/13/2013,8:01 PM

## 2013-09-13 NOTE — Procedures (Signed)
Successful placement of dual lumen PICC line to proximal left brachial vein. Length 10 cm, unable to cross centrally. Right brachial vein collapse, manual pressure, site soft without hematoma, left brachial vasospasm, unable to cross centrally. PICC aspirates well and flushes well, ready for use.  Tsosie Billing PA-C Interventional Radiology  09/13/13  11:24 AM

## 2013-09-13 NOTE — Consult Note (Signed)
PULMONARY  / CRITICAL CARE MEDICINE  Name: Bradley [name] MRN: [id number] DOB: [birth year] PCP [name], MD    ADMISSION DATE:  09/08/2013  LOS 5 days   CONSULTATION DATE:  09/13/13  REFERRING MD :  DR Clydene Laming of [facility] PRIMARY SERVICE: [facility]  CHIEF COMPLAINT:  Encephalopathy. Intubation riks  BRIEF PATIENT DESCRIPTION: septic encephalopathy and eombolic strokes due to presumed MSSA endocarditis  SIGNIFICANT EVENTS / STUDIES:  09/08/2013 - admit 09/09/13  - ID, Heme and Cards consult. ECHO ef 45%. No clear vegetations 2/14./15 - PCCM consult   LINES / TUBES: Left Midline PICC 09/13/13>>   CULTURES: 09/08/13 - Blood - STaph Aureus MSSA 09/08/13 - blood - Staph Aureus MSSA 09/09/13 - MRSA PCR - negative 09/10/13- blood >     ANTIBIOTICS: Anti-infectives   Start     Dose/Rate Route Frequency Ordered Stop   09/11/13 1800  gentamicin (GARAMYCIN) 90 mg in dextrose 5 % 50 mL IVPB  Status:  Discontinued     90 mg 104.5 mL/hr over 30 Minutes Intravenous Every 12 hours 09/11/13 0932 09/13/13 0802   09/09/13 1800  nafcillin 2 g in dextrose 5 % 50 mL IVPB     2 g 100 mL/hr over 30 Minutes Intravenous Every 4 hours 09/09/13 1415     09/09/13 1430  nafcillin 2 g in dextrose 5 % 50 mL IVPB     2 g 100 mL/hr over 30 Minutes Intravenous  Once 09/09/13 1415 09/09/13 1603   09/09/13 1415  nafcillin injection 2 g  Status:  Discontinued     2 g Intravenous Every 4 hours 09/09/13 1410 09/09/13 1413   09/09/13 0800  vancomycin (VANCOCIN) IVPB 1000 mg/200 mL premix  Status:  Discontinued     1,000 mg 200 mL/hr over 60 Minutes Intravenous Every 8 hours 09/09/13 0341 09/11/13 0829   09/09/13 0500  gentamicin (GARAMYCIN) 90 mg in dextrose 5 % 50 mL IVPB  Status:  Discontinued     1 mg/kg  90.7 kg 104.5 mL/hr over 30 Minutes Intravenous 3 times per day 09/09/13 0340 09/11/13 0932   09/09/13 0400  ceFEPIme (MAXIPIME) 2 g in dextrose 5 % 50 mL IVPB  Status:  Discontinued     2 g 100 mL/hr  over 30 Minutes Intravenous 3 times per day 09/09/13 0340 09/09/13 1410   09/08/13 2315  piperacillin-tazobactam (ZOSYN) IVPB 3.375 g     3.375 g 100 mL/hr over 30 Minutes Intravenous  Once 09/08/13 2307 09/09/13 0043   09/08/13 2315  vancomycin (VANCOCIN) IVPB 1000 mg/200 mL premix     1,000 mg 200 mL/hr over 60 Minutes Intravenous  Once 09/08/13 2307 09/09/13 0230       HISTORY OF PRESENT ILLNESS:    58 year old male with s/p Benall with bioprosthetic AVF in august 2011. 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 Admitted 09/08/2013 with encephalopathy and septic picture with imaging showing septic embolli of brain and cultures revelaing MSSA bacteremia. Concern is for MSSA endocarditis but TEE unable to be done due to encephalopathy, and thrombocytopenia (Heme consult Dr Jana Hakim feels this is c/w infection). On 09/13/13 develping renal failure (d2 of gent Stopped) and worsening encephlopathy and PCCM consulted.    PAST MEDICAL HISTORY :  Past Medical History  Diagnosis Date  . Anxiety   . Depression   . History of thyroid cancer   . Hypertension   . Hypothyroidism, postsurgical   .  Aortic stenosis     s/p Bentall with bioprosthetic AVR 02/2010; Last echo (9/11): Moderate LVH, EF 45-50%, AVR functioning appropriately, aortic valve mean gradient 21, diastolic dysfunction. Chest MRA (2/13): Mild to moderate dilatation at the sinus of Valsalva at 4.1 cm, mild dilatation ascending aorta distal to the tube graft at 3.9 cm, moderate dilatation of the innominate artery a 2.1 cm;    . Hx of cardiac cath     a. LHC in 02/2010: normal cors  . Hx of echocardiogram 2014    Echo (10/14): Severe LVH, EF 60-65%, normal wall motion, grade 1 diastolic dysfunction, AVR functioning normally, mild aortic stenosis (mean 19), moderately dilated aorta, mild LAE, mild RVE     Family History  Problem Relation Age of Onset  . Heart disease Father       History   Social History  . Marital Status: Married    Spouse Name: N/A    Number of Children: N/A  . Years of Education: N/A   Occupational History  . Not on file.   Social History Main Topics  . Smoking status: Never Smoker   . Smokeless tobacco: Never Used  . Alcohol Use: No  . Drug Use: No  . Sexual Activity: No   Other Topics Concern  . Not on file   Social History Narrative  . No narrative on file     No Known Allergies    (Not in an outpatient encounter)     REVIEW OF SYSTEMS:  Per HPI. REst negative  SUBJECTIVE:   VITAL SIGNS: Filed Vitals:   09/13/13 0200 09/13/13 0400 09/13/13 0600 09/13/13 0800  BP: 161/116 165/99 147/99 155/106  Pulse: 108 103 104   Temp:  100.4 F (38 C)  99.1 F (37.3 C)  TempSrc:  Axillary  Axillary  Resp: 24 26 30 17   Height:      Weight:  98.2 kg (216 lb 7.9 oz)    SpO2: 95% 98% 100% 100%      HEMODYNAMICS:   VENTILATOR SETTINGS: Vent Mode:  [-] PRVC FiO2 (%):  [30 %] 30 % Set Rate:  [28 bmp] 28 bmp Vt Set:  [420 mL] 420 mL PEEP:  [5 cmH20] 5 cmH20 Plateau Pressure:  [4 Y026551 cmH20] 20 cmH20 INTAKE / OUTPUT: I/O last 3 completed shifts: In: 4279.5 [I.V.:2350; Other:925; IV Piggyback:1004.5] Out: 1075 [Urine:1075]     PHYSICAL EXAMINATION: General:  WEll built male. Looks unwell Neuro:  Unresponsive. Per RN occ mumbles words HEENT:  Neck supple. Bearded male Cardiovascular:  Hypertensive, tachycadic Lungs:  CTA bilaterally, 3L [place] Abdomen:  Soft, no mass, normalbowerl sounds Musculoskeletal:  No cyanosis, No clubbing.No edema. No janeway lesions Skin:  INtact anteriorly  LABS: PULMONARY  Recent Labs Lab 09/12/13 1650 09/13/13 0755  PHART 7.543* 7.529*  PCO2ART 28.7* 28.9*  PO2ART 63.1* 72.4*  HCO3 24.6* 24.0  TCO2 21.5 20.8  O2SAT 94.6 96.2    CBC  Recent Labs Lab 09/11/13 1703 09/12/13 1420 09/13/13 0603  HGB 13.0 13.4 13.0  HCT 36.6* 37.7* 37.0*  WBC 23.2* 26.9* 27.9*   PLT 35* 48* 48*    COAGULATION  Recent Labs Lab 09/09/13 0958 09/10/13 0320 09/11/13 1327 09/12/13 1420 09/13/13 0603  INR 0.92 1.10 1.20 1.23 1.34    CARDIAC   Recent Labs Lab 09/10/13 1500 09/10/13 2315 09/12/13 0913 09/12/13 1419 09/12/13 2010  TROPONINI 2.05* 1.78* 1.52* 1.54* 1.39*    Recent Labs Lab 09/10/13 0753  PROBNP 37349.0*  CHEMISTRY  Recent Labs Lab 09/10/13 0320 09/11/13 0546 09/11/13 0750  09/12/13 0346  09/12/13 1730 09/12/13 2010 09/13/13 0035 09/13/13 0603 09/13/13 0726  NA 138 144  --   --  152*  < > 148* 149* 147 147 146  K 3.8 2.9*  --   < > 2.8*  < > 2.8*  --   --  3.1*  --   CL 103 109  --   --  114*  --  112  --   --  110  --   CO2 23 25  --   --  23  --  24  --   --  24  --   GLUCOSE 157* 148*  --   --  147*  --  255*  --   --  161*  --   BUN 23 27*  --   --  33*  --  34*  --   --  39*  --   CREATININE 1.00 1.01  --   --  1.19  --  1.31  --   --  1.89*  --   CALCIUM 8.0* 7.8*  --   --  8.1*  --  8.0*  --   --  7.8*  --   MG 2.2  --  2.2  --   --   --  2.4  --   --   --   --   < > = values in this interval not displayed. Estimated Creatinine Clearance: 53.8 ml/min (by C-G formula based on Cr of 1.89).   LIVER  Recent Labs Lab 09/09/13 0545 09/09/13 0958 09/10/13 0320 09/11/13 1327 09/12/13 0346 09/12/13 1420 09/12/13 1730 09/13/13 0603  AST 48*  --  41*  --  26  --  27 26  ALT 37  --  39  --  31  --  30 29  ALKPHOS 66  --  66  --  71  --  73 85  BILITOT 0.9  --  1.0  --  1.4*  --  1.3* 1.2  PROT 5.7*  --  5.4*  --  5.5*  --  5.3* 5.4*  ALBUMIN 2.5*  --  2.1*  --  2.0*  --  1.9* 1.8*  INR 0.98 0.92 1.10 1.20  --  1.23  --  1.34     INFECTIOUS  Recent Labs Lab 09/08/13 2345 09/10/13 1500  LATICACIDVEN 4.16* 1.3     ENDOCRINE CBG (last 3)   Recent Labs  09/12/13 2347 09/13/13 0401 09/13/13 0758  GLUCAP 152* 175* 161*         IMAGING x48h  Ct Head Wo Contrast  09/12/2013   CLINICAL  DATA:  Increased mental status change, evaluate for CVA versus worsening bleed.  EXAM: CT HEAD WITHOUT CONTRAST  TECHNIQUE: Contiguous axial images were obtained from the base of the skull through the vertex without intravenous contrast.  COMPARISON:  Prior MRI and and CT from 09/09/2013.  FINDINGS: Study is degraded by motion artifact.  Vasogenic edema as trending linear hyperdensity within the left parietal lobe is stable not significantly changed relative to the previous examination. Vague hypodensities involving the supratentorial brain and right cerebellar hemisphere are not significantly changed relative to recent MRI. No definite a new intracranial infarct identified. Previously seen hyperdense foci within the mid bilateral frontal lobes are less conspicuous as compared to prior. No new intracranial hemorrhage. No mass lesion or midline shift. Ventricles are stable in size  without evidence of hydrocephalus. No extra-axial fluid collection.  The calvarium is grossly intact. Orbits are normal. Paranasal sinuses and mastoid air cells remain clear.  IMPRESSION: 1. Limited study due to motion. No significant interval change in subacute left parietal infarct with small amount of associated hemorrhage. Additional multi focal infarcts involving the supratentorial brain and right cerebellar hemisphere are not significantly changed as well relative to recent MRI from 09/09/2013. No definite new intracranial infarct identified. No new intracranial hemorrhage.   Electronically Signed   By: Jeannine Boga M.D.   On: 09/12/2013 22:02       ASSESSMENT / PLAN:  PULMONARY A: aT risk for acute resp failure and intubation due to encephalopathy. Might need intubation for TEE anwayways P:   Closely monitor intubagte for procedure or deteriorations  CARDIOVASCULAR A: s/p BENTALL procedure 2011 Now  Type 2 NSTEMI and ? Acute systolic CHF P:  Per cards  RENAL A:  New renal failure 09/13/13 on d2 of gent P:    Gent stopped [facility] managing  GASTROINTESTINAL A:  ? NPO P:   Methanl status dictates NPO status  HEMATOLOGIC A:  Thrombocytopenia of sepsis P:  Monitior Plat > 50 for CVL  INFECTIOUS A:  MSSA bacteremia - source suspcted bioprosthetic aortic vavlve P:   Per ID  ENDOCRINE A:  Nil acute   P:   Per [facility]  NEUROLOGIC A:  Encephalopathic - impeding care P:   Start percedex If fails, intubate  TODAY'S SUMMARY: Wife, sister and daughter updated. Wife crying inconsolably. He is at risk for intubation. They understanmd prognosis poor or it will be a long haul     The patient is critically ill with multiple organ systems failure and requires high complexity decision making for assessment and support, frequent evaluation and titration of therapies, application of advanced monitoring technologies and extensive interpretation of multiple databases.   Critical Care Time devoted to patient care services described in this note is  60  Minutes.   Dr. Brand Males, M.D., [facility].C.P Pulmonary and Critical Care Medicine Staff Physician [facility] Pager: [phone], If no answer or between  15:00h - 7:00h: call [phone]  09/13/2013 12:05 PM

## 2013-09-13 NOTE — Progress Notes (Signed)
SLP Cancellation Note  Patient Details Name: Bradley [name] MRN: [id number] DOB: [birth year]   Cancelled treatment:       Reason Eval/Treat Not Completed: Fatigue/lethargy limiting ability to participate;Medical issues which prohibited therapy. Pt remains deeply encephalopathic. Rn and family report he has not had any appropriate arousal recently. SLP will sign off at this time. Please reorder when pt become more alert.   Herbie [place], [place] CCC-SLP [phone]  Lynann Beaver 09/13/2013, 8:49 AM

## 2013-09-13 NOTE — Progress Notes (Signed)
[facility] PROGRESS NOTE  Bradley [name] [id number] DOB: 1955/12/04 DOA: 09/08/2013 PCP: [name], MD  Assessment/Plan:  Gram-positive cocci sepsis/septic brain emboli suspicious for infective endocarditis/? CAP/?? - Patient was admitted to ICU. Blood cultures x2 shows gram-positive cocci in clusters (MSSA).  -. Influenza panel PCR negative.  -Chest x-ray suggests early pneumonia of left lung base with small left pleural effusion.  -Transthoracic echo requested and pending.  -Continue IV nafcillin and gentamicin; NOTE; 2/14 gentamicin on hold until level can be obtained.  -Infectious disease consulted. - DC normal saline; D5W 500 mL bolus x1 then start D5W 15ml/hour   UTI -Urine culture: Positive MSSA - continue ABx    Acute encephalopathy  - Most likely secondary to severe sepsis and septic brain emboli.  -Continue antibiotics  -Patient more awake today will order bedside swallow    Severe thrombocytopenia  - Platelet counts have dropped from 100 (09/06/13) > 23 (09/09/13); 2/12= 26. Most likely secondary to severe sepsis. 2/12 INR= 1.2 normal. Fibrinogen 586.  -No schistocytes on peripheral smear.  -DIC panel positive  - HIT panel negative -Hematology consulted.  -No overt bleeding at this time.  -No antiplatelets or heparin products at this time.  -Trend daily CBCs.  -Transfuse if has overt bleeding or platelet count less than 10 K.   Hypokalemia  - 2/13 Replace intravenously -Recheck potassium at 1300 on 2/13   Leukocytosis  - Secondary to sepsis. Trend daily CBCs   Active MI  - 2/11 Troponin =2.26 -Trend Troponin    Systolic and Diastolic CHF - Acute vs Chronic ? -DC normal saline; D5W 500 mL bolus x1 then start D5W 120ml/hour  Status post tissue AVR/LBBB  - Requested transthoracic 2-D echocardiogram.TEE when stable. - Cardiology consulted. TTE Eventually?    History of thyroid cancer, post thyroidectomy hypothyroidism  - Thyroid  supplements. Elevated TSH may be secondary to acute illness. - -  Recommend followup in 4-6 weeks.  -Restart home dose Synthroid when patient able to swallow  Hypertension  - Continue when necessary IV metoprolol. Hold lisinopril.   ? Left hemiparesis/Septic Emboli-hemorrhagic  -  Hyperglycemia  - Hemoglobin A1c = 5.7  -Continue sensitive SSI.   History of depression  - hold Zoloft secondary to acute encephalopathy.  Hyponatremia -DC normal saline -Bolus D5W 5 ML, then D5W at 139ml/hr -Every 4 hour NA check  Agitation -Increase Haldol 4 mg every 6 hour PRN agitation -Start morphine 1 mg every 4 hour PRN pain  Respiratory alkalosis -Patient readings of Cheyne-Stokes breathing. -Have consulted Dr. Chase Caller (PCCM) evaluate for possible intubation -Consider starting patient on beta blocker after returns to floor. -ADDENDUM; Dr. Ivery Quale) has started patient on Precedex 0.1 mcg/kg/hr will hold on starting any beta blocker at this time  Renal failure -Spoke with Dr. Megan Salon (infectious disease) will hold gentamicin, until level obtained can be nephrotoxic  -Obtain BMP 1500    Code Status: Full Family Communication:  no family present for discussion of plan of care  Disposition Plan: Resolution of sepsis,   Consultants: Dr Kathrynn Speed (Neurology)  Dr Lurline Del (Oncology) Dr Dorris Carnes (Cardiology)    Procedures: 09/12/2013 CT head without contrast 1. Limited study due to motion. No significant interval change in  subacute left parietal infarct with small amount of associated  hemorrhage. Additional multi focal infarcts involving the  supratentorial brain and right cerebellar hemisphere are not  significantly changed as well relative to recent MRI from  09/09/2013. No definite new intracranial infarct identified.  No new  intracranial hemorrhage.  PBS pathology read 09/10/2013 LEUKOCYTOSIS WITH NEUTROPHILIA, AND THROMBOCYTOPENIA.   MRI Brain  09/09/2013 Multiple bilateral supra tentorial and right cerebellar regions of  restricted motion some of which are partially hemorrhagic with  largest area left parietal lobe. In the present clinical setting,  these findings are consistent with acute partially hemorrhagic  infarcts and possibly related to septic emboli.  CT Head W/O Contrast 09/09/2013 1. Increasing edema around linear high attenuation in the left  parietal lobe. Followup MRI with and without contrast is recommended  for further assessment. The patient presents with an infectious  syndrome and septic emboli are in the differential considerations.  Infarct with a small amount of hemorrhage or cortical laminar  necrosis as well as developmental venous anomaly remain in the  differential considerations. MRI should be useful in further  characterization.  2. Dr. Leonel Ramsay called discuss the case at 0230 hr on the day of  Examination.  Echocardiogram 09/09/2013 - Left ventricle: septal and apical hypokinesis The cavity size was mildly dilated. - moderate LVH. Systolic function was mildly reduced.  -LVEF=  45% to 50%. - Aortic valve: Apparent tissue AVR. No abscess or vegetation seen.  -No AR or perivalvular leak and normal systolic gradients Suggest TEE if clinical suspicion for SBE high - Mitral valve: Mild regurgitation. - Left atrium: The atrium was mildly dilated.     MRSA Neg PCR     Antibiotics: Gentamicin 2/10>>  Nafcillin 2/10>> Vancomycin 2/10>>2/12     HPI/Subjective: 58 year old male with history of aortic stenosis, status post pediatric aortic valvulotomy at age 52 followed by Bentall procedure 02/2000 with tissue AVR, cardiac cath 02/2010 with normal coronaries, LBBB, thyroid cancer-post thyroidectomy hypothyroidism, HTN and depression has been generally unwell for 2-3 weeks, recently seen in the ED on 09/06/13 and assessed as influenza related vomiting, diarrhea, abdominal pain, body aches, fever and  mild confusion. Family was given option of hospitalization versus discharge home-family opted to go home and he was discharged on Tamiflu. However, patient worsened at home with worsening confusion/disorientation and was admitted with sepsis, septic brain embolization and rule out infective endocarditis. Family recently acquired a dog? Stray- 3 weeks ago. 2/11 states negative pain/SOB 2/12 patient A./O. x1, will follow some commands. Becomes agitated as the day progresses 2/13 no acute events overnight, patient has had increased agitation and danger of pulling out Foley/IV lines 2/14 patient A./O. x0 very agitated, thrashing, does not follow orders, does not attempt to communicate.     Objective: Filed Vitals:   09/13/13 0100 09/13/13 0200 09/13/13 0400 09/13/13 0600  BP:  161/116 165/99 147/99  Pulse: 110 108 103 104  Temp:   100.4 F (38 C)   TempSrc:   Axillary   Resp: 28 24 26 30   Height:      Weight:   98.2 kg (216 lb 7.9 oz)   SpO2: 100% 95% 98% 100%    Intake/Output Summary (Last 24 hours) at 09/13/13 0744 Last data filed at 09/13/13 0600  Gross per 24 hour  Intake 3577.25 ml  Output    550 ml  Net 3027.25 ml   Filed Weights   09/09/13 0500 09/12/13 0400 09/13/13 0400  Weight: 96 kg (211 lb 10.3 oz) 95.6 kg (210 lb 12.2 oz) 98.2 kg (216 lb 7.9 oz)    Exam:   General: A./O. x0, NAD,  Cardiovascular: Tachycardic, regular rhythm,  positive systolic murmur 2/5, negative rubs or gallops, DP/PT pulse one plus bilateral  Respiratory: Coarse breath sounds bilateral   Abdomen: Soft, nontender, nondistended, plus bowel  Musculoskeletal: Negative pedal edema, negative ecchymosis  Neurologic; patient responds to painful stimuli; unable to complete neurologic assessment today secondary to patient's increasing agitation  Data Reviewed: Basic Metabolic Panel:  Recent Labs Lab 09/10/13 0320 09/11/13 0546 09/11/13 0750 09/11/13 1328 09/12/13 0346  09/12/13 1420  09/12/13 1730 09/12/13 2010 09/13/13 0035 09/13/13 0603  NA 138 144  --   --  152*  < > 152* 148* 149* 147 147  K 3.8 2.9*  --  3.0* 2.8*  --  2.9* 2.8*  --   --  3.1*  CL 103 109  --   --  114*  --   --  112  --   --  110  CO2 23 25  --   --  23  --   --  24  --   --  24  GLUCOSE 157* 148*  --   --  147*  --   --  255*  --   --  161*  BUN 23 27*  --   --  33*  --   --  34*  --   --  39*  CREATININE 1.00 1.01  --   --  1.19  --   --  1.31  --   --  1.89*  CALCIUM 8.0* 7.8*  --   --  8.1*  --   --  8.0*  --   --  7.8*  MG 2.2  --  2.2  --   --   --   --  2.4  --   --   --   < > = values in this interval not displayed. Liver Function Tests:  Recent Labs Lab 09/09/13 0545 09/10/13 0320 09/12/13 0346 09/12/13 1730 09/13/13 0603  AST 48* 41* 26 27 26   ALT 37 39 31 30 29   ALKPHOS 66 66 71 73 85  BILITOT 0.9 1.0 1.4* 1.3* 1.2  PROT 5.7* 5.4* 5.5* 5.3* 5.4*  ALBUMIN 2.5* 2.1* 2.0* 1.9* 1.8*    Recent Labs Lab 09/06/13 2018  LIPASE 18    Recent Labs Lab 09/06/13 2018 09/08/13 2336  AMMONIA 31 39   CBC:  Recent Labs Lab 09/10/13 0944 09/11/13 0750 09/11/13 1703 09/12/13 1420 Q000111Q 0000000  WBC DUPLICATE REQUEST SEE Q000111Q 23.0* 23.2* 26.9* 27.9*  NEUTROABS PENDING 19.8* 19.5* 22.5* PENDING  HGB DUPLICATE REQUEST SEE Q000111Q 12.7* 13.0 0000000 [id number]  HCT DUPLICATE REQUEST SEE Q000111Q 35.8* 36.6* 0000000* [id number]*  MCV DUPLICATE REQUEST SEE Q000111Q 84.8 83.8 [id number]  PLT DUPLICATE REQUEST SEE Q000111Q 26* 35* 48* PENDING   Cardiac Enzymes:  Recent Labs Lab 09/10/13 1500 09/10/13 2315 09/12/13 0913 09/12/13 1419 09/12/13 1730 09/12/13 2010  CKTOTAL  --   --   --   --  122  --   TROPONINI 2.05* 1.78* 1.52* 1.54*  --  1.39*   BNP (last 3 results)  Recent Labs  09/10/13 0753  PROBNP 37349.0*   CBG:  Recent Labs Lab 09/12/13 1232 09/12/13 1631 09/12/13 1957 09/12/13 2347 09/13/13 0401  GLUCAP 169* 208* 157* 152* 175*    Recent Results (from the past 240  hour(s))  CULTURE, BLOOD (ROUTINE X 2)     Status: None   Collection Time    09/08/13 10:00 PM      Result Value Ref Range Status   Specimen Description BLOOD RIGHT ARM   Final   Special  Requests BOTTLES DRAWN AEROBIC AND ANAEROBIC 5CC   Final   Culture  Setup Time     Final   Value: 09/09/2013 03:30     Performed at [facility]   Culture     Final   Value: STAPHYLOCOCCUS AUREUS     Note: RIFAMPIN AND GENTAMICIN SHOULD NOT BE USED AS SINGLE DRUGS FOR TREATMENT OF STAPH INFECTIONS.     Note: Gram Stain Report Called to,Read Back By and Verified With: NEELY RICHARDSON @ 1220 09/09/13 BY KRAWS     Performed at [facility]   Report Status 09/11/2013 FINAL   Final   Organism ID, Bacteria STAPHYLOCOCCUS AUREUS   Final  URINE CULTURE     Status: None   Collection Time    09/08/13 10:03 PM      Result Value Ref Range Status   Specimen Description URINE, RANDOM   Final   Special Requests NONE   Final   Culture  Setup Time     Final   Value: 09/09/2013 04:59     Performed at [place]     Final   Value: >=100,000 COLONIES/ML     Performed at [facility]   Culture     Final   Value: STAPHYLOCOCCUS AUREUS     Note: RIFAMPIN AND GENTAMICIN SHOULD NOT BE USED AS SINGLE DRUGS FOR TREATMENT OF STAPH INFECTIONS.     Performed at [facility]   Report Status 09/11/2013 FINAL   Final   Organism ID, Bacteria STAPHYLOCOCCUS AUREUS   Final  CULTURE, BLOOD (ROUTINE X 2)     Status: None   Collection Time    09/08/13 11:36 PM      Result Value Ref Range Status   Specimen Description BLOOD RIGHT ANTECUBITAL   Final   Special Requests BOTTLES DRAWN AEROBIC AND ANAEROBIC 5CC   Final   Culture  Setup Time     Final   Value: 09/09/2013 03:29     Performed at [facility]   Culture     Final   Value: STAPHYLOCOCCUS AUREUS     Note: SUSCEPTIBILITIES PERFORMED ON PREVIOUS CULTURE WITHIN THE LAST 5 DAYS.     Note: Gram Stain Report  Called to,Read Back By and Verified With:  NEELY RICHARDSON @ 1220 09/09/13 BY KRAWS     Performed at [facility]   Report Status 09/11/2013 FINAL   Final  MRSA PCR SCREENING     Status: None   Collection Time    09/09/13  4:44 AM      Result Value Ref Range Status   MRSA by PCR NEGATIVE  NEGATIVE Final   Comment:            The GeneXpert MRSA Assay (FDA     approved for NASAL specimens     only), is one component of a     comprehensive MRSA colonization     surveillance program. It is not     intended to diagnose MRSA     infection nor to guide or     monitor treatment for     MRSA infections.  CULTURE, BLOOD (ROUTINE X 2)     Status: None   Collection Time    09/10/13 11:05 AM      Result Value Ref Range Status   Specimen Description BLOOD RIGHT HAND   Final   Special Requests BOTTLES DRAWN AEROBIC ONLY 5CC   Final  Culture  Setup Time     Final   Value: 09/10/2013 14:22     Performed at [facility]   Culture     Final   Value:        BLOOD CULTURE RECEIVED NO GROWTH TO DATE CULTURE WILL BE HELD FOR 5 DAYS BEFORE ISSUING A FINAL NEGATIVE REPORT     Performed at [facility]   Report Status PENDING   Incomplete  CULTURE, BLOOD (ROUTINE X 2)     Status: None   Collection Time    09/10/13 11:06 AM      Result Value Ref Range Status   Specimen Description BLOOD LEFT HAND   Final   Special Requests BOTTLES DRAWN AEROBIC AND ANAEROBIC 5CC   Final   Culture  Setup Time     Final   Value: 09/10/2013 14:27     Performed at [facility]   Culture     Final   Value:        BLOOD CULTURE RECEIVED NO GROWTH TO DATE CULTURE WILL BE HELD FOR 5 DAYS BEFORE ISSUING A FINAL NEGATIVE REPORT     Performed at [facility]   Report Status PENDING   Incomplete     Studies: Ct Head Wo Contrast  09/12/2013   CLINICAL DATA:  Increased mental status change, evaluate for CVA versus worsening bleed.  EXAM: CT HEAD WITHOUT CONTRAST  TECHNIQUE: Contiguous  axial images were obtained from the base of the skull through the vertex without intravenous contrast.  COMPARISON:  Prior MRI and and CT from 09/09/2013.  FINDINGS: Study is degraded by motion artifact.  Vasogenic edema as trending linear hyperdensity within the left parietal lobe is stable not significantly changed relative to the previous examination. Vague hypodensities involving the supratentorial brain and right cerebellar hemisphere are not significantly changed relative to recent MRI. No definite a new intracranial infarct identified. Previously seen hyperdense foci within the mid bilateral frontal lobes are less conspicuous as compared to prior. No new intracranial hemorrhage. No mass lesion or midline shift. Ventricles are stable in size without evidence of hydrocephalus. No extra-axial fluid collection.  The calvarium is grossly intact. Orbits are normal. Paranasal sinuses and mastoid air cells remain clear.  IMPRESSION: 1. Limited study due to motion. No significant interval change in subacute left parietal infarct with small amount of associated hemorrhage. Additional multi focal infarcts involving the supratentorial brain and right cerebellar hemisphere are not significantly changed as well relative to recent MRI from 09/09/2013. No definite new intracranial infarct identified. No new intracranial hemorrhage.   Electronically Signed   By: Jeannine Boga M.D.   On: 09/12/2013 22:02    Scheduled Meds: . antiseptic oral rinse  15 mL Mouth Rinse q12n4p  . chlorhexidine  15 mL Mouth Rinse BID  . gentamicin  90 mg Intravenous Q12H  . insulin aspart  0-9 Units Subcutaneous 6 times per day  . levothyroxine  87.5 mcg Intravenous Daily  . nafcillin IV  2 g Intravenous Q4H  . sodium chloride  3 mL Intravenous Q12H   Continuous Infusions: . dextrose 150 mL/hr at 09/12/13 1705    Principal Problem:   Sepsis Active Problems:   HYPOTHYROIDISM- TSH 7.5   Aortic valve replaced- Bentall  propceedure 2011   Hypertension   Depression   LBBB (left bundle branch block)   Major depressive disorder, recurrent, severe with psychotic features- hospitalized in Jan 2013   Generalized anxiety disorder   Altered  mental state   Encephalitis   Septic embolism   HCAP (healthcare-associated pneumonia)   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   Acute diastolic heart failure   NSTEMI (non-ST elevated myocardial infarction)   Agitation    Time spent: 72min    Cordera Stineman, Loraine, J  [facility] Pager [phone]. If 7PM-7AM, please contact night-coverage at [email or web address], password [email or web address] 09/13/2013, 7:44 AM  LOS: 5 days

## 2013-09-13 NOTE — Progress Notes (Signed)
Patient ID: Bradley [name], male   DOB: Aug 31, 1955, 58 y.o.   MRN: [id number]         [facility]    Date of Admission:  09/08/2013    Total days of antibiotics 5         Principal Problem:   Sepsis Active Problems:   HYPOTHYROIDISM- TSH 7.5   Aortic valve replaced- Bentall propceedure 2011   Hypertension   Depression   LBBB (left bundle branch block)   Major depressive disorder, recurrent, severe with psychotic features- hospitalized in Jan 2013   Generalized anxiety disorder   Altered mental state   Encephalitis   Septic embolism   HCAP (healthcare-associated pneumonia)   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   Acute diastolic heart failure   NSTEMI (non-ST elevated myocardial infarction)   Agitation   . antiseptic oral rinse  15 mL Mouth Rinse q12n4p  . chlorhexidine  15 mL Mouth Rinse BID  . insulin aspart  0-9 Units Subcutaneous 6 times per day  . levothyroxine  87.5 mcg Intravenous Daily  . nafcillin IV  2 g Intravenous Q4H  . potassium chloride  10 mEq Intravenous Q1 Hr x 3  . sodium chloride  3 mL Intravenous Q12H    Past Medical History  Diagnosis Date  . Anxiety   . Depression   . History of thyroid cancer   . Hypertension   . Hypothyroidism, postsurgical   . Aortic stenosis     s/p Bentall with bioprosthetic AVR 02/2010; Last echo (9/11): Moderate LVH, EF 45-50%, AVR functioning appropriately, aortic valve mean gradient 21, diastolic dysfunction. Chest MRA (2/13): Mild to moderate dilatation at the sinus of Valsalva at 4.1 cm, mild dilatation ascending aorta distal to the tube graft at 3.9 cm, moderate dilatation of the innominate artery a 2.1 cm;    . Hx of cardiac cath     a. LHC in 02/2010: normal cors  . Hx of echocardiogram 2014    Echo (10/14): Severe LVH, EF 60-65%, normal wall motion, grade 1 diastolic dysfunction, AVR functioning normally, mild aortic stenosis (mean 19), moderately dilated aorta, mild LAE, mild RVE    History    Substance Use Topics  . Smoking status: Never Smoker   . Smokeless tobacco: Never Used  . Alcohol Use: No    Family History  Problem Relation Age of Onset  . Heart disease Father     No Known Allergies  Objective: Temp:  [99.1 F (37.3 C)-101 F (38.3 C)] 99.1 F (37.3 C) (02/14 0800) Pulse Rate:  [69-115] 104 (02/14 0600) Resp:  [0-40] 17 (02/14 0800) BP: (135-171)/(76-116) 155/106 mmHg (02/14 0800) SpO2:  [95 %-100 %] 100 % (02/14 0800) Weight:  [98.2 kg (216 lb 7.9 oz)] 98.2 kg (216 lb 7.9 oz) (02/14 0400)  General: He has Cheyne-Stokes respirations. He does not follow commands. He is agitated Skin: No rash Lungs: Rhonchi Cor: Regular S1 and S2 with a 2/6 systolic murmur heard best at the right sternal border Abdomen: Soft Joints and extremities: No acute abnormalities noted  Lab Results Lab Results  Component Value Date   WBC 27.9* 09/13/2013   HGB 13.0 09/13/2013   HCT 37.0* 09/13/2013   MCV 84.9 09/13/2013   PLT 48* 09/13/2013    Lab Results  Component Value Date   CREATININE 1.89* 09/13/2013   BUN 39* 09/13/2013   NA 146 09/13/2013   K 3.1* 09/13/2013   CL 110 09/13/2013   CO2 24 09/13/2013  Lab Results  Component Value Date   ALT 29 09/13/2013   AST 26 09/13/2013   ALKPHOS 85 09/13/2013   BILITOT 1.2 09/13/2013      Microbiology: Recent Results (from the past 240 hour(s))  CULTURE, BLOOD (ROUTINE X 2)     Status: None   Collection Time    09/08/13 10:00 PM      Result Value Ref Range Status   Specimen Description BLOOD RIGHT ARM   Final   Special Requests BOTTLES DRAWN AEROBIC AND ANAEROBIC 5CC   Final   Culture  Setup Time     Final   Value: 09/09/2013 03:30     Performed at [facility]   Culture     Final   Value: STAPHYLOCOCCUS AUREUS     Note: RIFAMPIN AND GENTAMICIN SHOULD NOT BE USED AS SINGLE DRUGS FOR TREATMENT OF STAPH INFECTIONS.     Note: Gram Stain Report Called to,Read Back By and Verified With: NEELY RICHARDSON @ 1220 09/09/13  BY KRAWS     Performed at [facility]   Report Status 09/11/2013 FINAL   Final   Organism ID, Bacteria STAPHYLOCOCCUS AUREUS   Final  URINE CULTURE     Status: None   Collection Time    09/08/13 10:03 PM      Result Value Ref Range Status   Specimen Description URINE, RANDOM   Final   Special Requests NONE   Final   Culture  Setup Time     Final   Value: 09/09/2013 04:59     Performed at [place]     Final   Value: >=100,000 COLONIES/ML     Performed at [facility]   Culture     Final   Value: STAPHYLOCOCCUS AUREUS     Note: RIFAMPIN AND GENTAMICIN SHOULD NOT BE USED AS SINGLE DRUGS FOR TREATMENT OF STAPH INFECTIONS.     Performed at [facility]   Report Status 09/11/2013 FINAL   Final   Organism ID, Bacteria STAPHYLOCOCCUS AUREUS   Final  CULTURE, BLOOD (ROUTINE X 2)     Status: None   Collection Time    09/08/13 11:36 PM      Result Value Ref Range Status   Specimen Description BLOOD RIGHT ANTECUBITAL   Final   Special Requests BOTTLES DRAWN AEROBIC AND ANAEROBIC 5CC   Final   Culture  Setup Time     Final   Value: 09/09/2013 03:29     Performed at [facility]   Culture     Final   Value: STAPHYLOCOCCUS AUREUS     Note: SUSCEPTIBILITIES PERFORMED ON PREVIOUS CULTURE WITHIN THE LAST 5 DAYS.     Note: Gram Stain Report Called to,Read Back By and Verified With:  NEELY RICHARDSON @ 1220 09/09/13 BY KRAWS     Performed at [facility]   Report Status 09/11/2013 FINAL   Final  MRSA PCR SCREENING     Status: None   Collection Time    09/09/13  4:44 AM      Result Value Ref Range Status   MRSA by PCR NEGATIVE  NEGATIVE Final   Comment:            The GeneXpert MRSA Assay (FDA     approved for NASAL specimens     only), is one component of a     comprehensive MRSA colonization     surveillance program. It is not  intended to diagnose MRSA     infection nor to guide or     monitor treatment for      MRSA infections.  CULTURE, BLOOD (ROUTINE X 2)     Status: None   Collection Time    09/10/13 11:05 AM      Result Value Ref Range Status   Specimen Description BLOOD RIGHT HAND   Final   Special Requests BOTTLES DRAWN AEROBIC ONLY 5CC   Final   Culture  Setup Time     Final   Value: 09/10/2013 14:22     Performed at [facility]   Culture     Final   Value:        BLOOD CULTURE RECEIVED NO GROWTH TO DATE CULTURE WILL BE HELD FOR 5 DAYS BEFORE ISSUING A FINAL NEGATIVE REPORT     Performed at [facility]   Report Status PENDING   Incomplete  CULTURE, BLOOD (ROUTINE X 2)     Status: None   Collection Time    09/10/13 11:06 AM      Result Value Ref Range Status   Specimen Description BLOOD LEFT HAND   Final   Special Requests BOTTLES DRAWN AEROBIC AND ANAEROBIC 5CC   Final   Culture  Setup Time     Final   Value: 09/10/2013 14:27     Performed at [facility]   Culture     Final   Value:        BLOOD CULTURE RECEIVED NO GROWTH TO DATE CULTURE WILL BE HELD FOR 5 DAYS BEFORE ISSUING A FINAL NEGATIVE REPORT     Performed at [facility]   Report Status PENDING   Incomplete    Studies/Results: Ct Head Wo Contrast  09/12/2013   CLINICAL DATA:  Increased mental status change, evaluate for CVA versus worsening bleed.  EXAM: CT HEAD WITHOUT CONTRAST  TECHNIQUE: Contiguous axial images were obtained from the base of the skull through the vertex without intravenous contrast.  COMPARISON:  Prior MRI and and CT from 09/09/2013.  FINDINGS: Study is degraded by motion artifact.  Vasogenic edema as trending linear hyperdensity within the left parietal lobe is stable not significantly changed relative to the previous examination. Vague hypodensities involving the supratentorial brain and right cerebellar hemisphere are not significantly changed relative to recent MRI. No definite a new intracranial infarct identified. Previously seen hyperdense foci within the mid  bilateral frontal lobes are less conspicuous as compared to prior. No new intracranial hemorrhage. No mass lesion or midline shift. Ventricles are stable in size without evidence of hydrocephalus. No extra-axial fluid collection.  The calvarium is grossly intact. Orbits are normal. Paranasal sinuses and mastoid air cells remain clear.  IMPRESSION: 1. Limited study due to motion. No significant interval change in subacute left parietal infarct with small amount of associated hemorrhage. Additional multi focal infarcts involving the supratentorial brain and right cerebellar hemisphere are not significantly changed as well relative to recent MRI from 09/09/2013. No definite new intracranial infarct identified. No new intracranial hemorrhage.   Electronically Signed   By: Jeannine Boga M.D.   On: 09/12/2013 22:02    Assessment: I agree that he probably does be MSSA prosthetic aortic valve endocarditis and emboli causing multiple septic embolic strokes. I will have progressive neurologic decline over the past 24 hours along with electrolyte disturbances and acute renal insufficiency. His gentamicin is on hold pending repeat levels. Repeat blood cultures are negative to date.  Plan: 1. Continue nafcillin but monitor renal function closely  Michel Bickers, MD [facility] [place] [phone] pager   [phone] cell 09/13/2013, 10:07 AM

## 2013-09-14 LAB — CBC WITH DIFFERENTIAL/PLATELET
Basophils Absolute: 0 10*3/uL (ref 0.0–0.1)
Basophils Relative: 0 % (ref 0–1)
Eosinophils Absolute: 0.3 10*3/uL (ref 0.0–0.7)
Eosinophils Relative: 1 % (ref 0–5)
HCT: 36.4 % — ABNORMAL LOW (ref 39.0–52.0)
Hemoglobin: 12.8 g/dL — ABNORMAL LOW (ref 13.0–17.0)
Lymphocytes Relative: 4 % — ABNORMAL LOW (ref 12–46)
Lymphs Abs: 1.1 10*3/uL (ref 0.7–4.0)
MCH: 29.8 pg (ref 26.0–34.0)
MCHC: 35.2 g/dL (ref 30.0–36.0)
MCV: 84.7 fL (ref 78.0–100.0)
Monocytes Absolute: 1.1 10*3/uL — ABNORMAL HIGH (ref 0.1–1.0)
Monocytes Relative: 4 % (ref 3–12)
Neutro Abs: 24 10*3/uL — ABNORMAL HIGH (ref 1.7–7.7)
Neutrophils Relative %: 91 % — ABNORMAL HIGH (ref 43–77)
Platelets: 49 10*3/uL — ABNORMAL LOW (ref 150–400)
RBC: 4.3 MIL/uL (ref 4.22–5.81)
RDW: 15.5 % (ref 11.5–15.5)
WBC: 26.5 10*3/uL — ABNORMAL HIGH (ref 4.0–10.5)

## 2013-09-14 LAB — BLOOD GAS, ARTERIAL
Acid-Base Excess: 0.1 mmol/L (ref 0.0–2.0)
Bicarbonate: 21.9 mEq/L (ref 20.0–24.0)
FIO2: 0.21 %
O2 Saturation: 94.8 %
Patient temperature: 98.6
TCO2: 19 mmol/L (ref 0–100)
pCO2 arterial: 28.7 mmHg — ABNORMAL LOW (ref 35.0–45.0)
pH, Arterial: 7.496 — ABNORMAL HIGH (ref 7.350–7.450)
pO2, Arterial: 65.2 mmHg — ABNORMAL LOW (ref 80.0–100.0)

## 2013-09-14 LAB — COMPREHENSIVE METABOLIC PANEL
ALT: 26 U/L (ref 0–53)
AST: 24 U/L (ref 0–37)
Albumin: 1.7 g/dL — ABNORMAL LOW (ref 3.5–5.2)
Alkaline Phosphatase: 81 U/L (ref 39–117)
BUN: 56 mg/dL — ABNORMAL HIGH (ref 6–23)
CO2: 21 mEq/L (ref 19–32)
Calcium: 7.4 mg/dL — ABNORMAL LOW (ref 8.4–10.5)
Chloride: 103 mEq/L (ref 96–112)
Creatinine, Ser: 3.89 mg/dL — ABNORMAL HIGH (ref 0.50–1.35)
GFR calc Af Amer: 18 mL/min — ABNORMAL LOW (ref >90)
GFR calc non Af Amer: 16 mL/min — ABNORMAL LOW (ref >90)
Glucose, Bld: 158 mg/dL — ABNORMAL HIGH (ref 70–99)
Potassium: 2.9 mEq/L — CL (ref 3.7–5.3)
Sodium: 141 mEq/L (ref 137–147)
Total Bilirubin: 1.5 mg/dL — ABNORMAL HIGH (ref 0.3–1.2)
Total Protein: 5.4 g/dL — ABNORMAL LOW (ref 6.0–8.3)

## 2013-09-14 LAB — POTASSIUM: Potassium: 3.5 mEq/L — ABNORMAL LOW (ref 3.7–5.3)

## 2013-09-14 LAB — T3: T3, Total: 29.1 ng/dl — ABNORMAL LOW (ref 80.0–204.0)

## 2013-09-14 LAB — GLUCOSE, CAPILLARY
Glucose-Capillary: 143 mg/dL — ABNORMAL HIGH (ref 70–99)
Glucose-Capillary: 126 mg/dL — ABNORMAL HIGH (ref 70–99)
Glucose-Capillary: 105 mg/dL — ABNORMAL HIGH (ref 70–99)
Glucose-Capillary: 113 mg/dL — ABNORMAL HIGH (ref 70–99)
Glucose-Capillary: 114 mg/dL — ABNORMAL HIGH (ref 70–99)
Glucose-Capillary: 103 mg/dL — ABNORMAL HIGH (ref 70–99)
Glucose-Capillary: 91 mg/dL (ref 70–99)

## 2013-09-14 LAB — PHOSPHORUS: Phosphorus: 2.6 mg/dL (ref 2.3–4.6)

## 2013-09-14 LAB — PROCALCITONIN: Procalcitonin: 1.5 ng/mL

## 2013-09-14 LAB — TSH: TSH: 10.174 u[IU]/mL — ABNORMAL HIGH (ref 0.350–4.500)

## 2013-09-14 LAB — PROTIME-INR
INR: 1.37 (ref 0.00–1.49)
Prothrombin Time: 16.5 seconds — ABNORMAL HIGH (ref 11.6–15.2)

## 2013-09-14 LAB — PRO B NATRIURETIC PEPTIDE: Pro B Natriuretic peptide (BNP): >70000 pg/mL — ABNORMAL HIGH (ref 0.0–100.0)

## 2013-09-14 LAB — SODIUM: Sodium: 141 mEq/L (ref 137–147)

## 2013-09-14 LAB — T4, FREE: Free T4: 0.84 ng/dL (ref 0.80–1.80)

## 2013-09-14 LAB — MAGNESIUM: Magnesium: 2.3 mg/dL (ref 1.5–2.5)

## 2013-09-14 MED ORDER — POTASSIUM CHLORIDE 10 MEQ/50ML IV SOLN
10.0000 meq | INTRAVENOUS | Status: AC
  Filled 2013-09-14: qty 50

## 2013-09-14 MED ORDER — POTASSIUM CHLORIDE 10 MEQ/100ML IV SOLN
10.0000 meq | INTRAVENOUS | Status: AC
  Administered 2013-09-14 (×2): 10 meq via INTRAVENOUS
  Filled 2013-09-14: qty 100

## 2013-09-14 MED ORDER — SODIUM CHLORIDE 0.9 % IV SOLN
300.0000 mg | Freq: Three times a day (TID) | INTRAVENOUS | Status: DC
  Administered 2013-09-14 – 2013-09-24 (×29): 300 mg via INTRAVENOUS
  Filled 2013-09-14 (×36): qty 300

## 2013-09-14 MED ORDER — POTASSIUM CHLORIDE 10 MEQ/100ML IV SOLN
10.0000 meq | INTRAVENOUS | Status: AC
  Administered 2013-09-14 (×3): 10 meq via INTRAVENOUS
  Filled 2013-09-14 (×2): qty 100

## 2013-09-14 MED ORDER — POTASSIUM CHLORIDE 10 MEQ/100ML IV SOLN
INTRAVENOUS | Status: AC
  Filled 2013-09-14: qty 100

## 2013-09-14 NOTE — Consult Note (Signed)
Renal Service Consult Note [facility]  PINCHUS DISCHLER 09/14/2013 Roney Jaffe D Requesting Physician:  Dr Sherral Hammers  Reason for Consult:  Acute renal failure HPI: The patient is a 58 y.o. year-old with hx of congenital aortic valve disease, he had valve "repair" surgery in his teens, then in 2011 had AoV replaced with Bentall procedure w tissue valve. Now admitted on 2/10 with fever, AMS. Workup showed embolic strokes and staph in blood cx's, not able to do TEE due to low plts, empirically treated for endocarditis with gent, nafcillin for the last 4-5 days and now has rising creatinine and minimal UOP.  BP's have been high, wt is up. Pt confused, no history obtained  Past Medical History  Past Medical History  Diagnosis Date  . Anxiety   . Depression   . History of thyroid cancer   . Hypertension   . Hypothyroidism, postsurgical   . Aortic stenosis     s/p Bentall with bioprosthetic AVR 02/2010; Last echo (9/11): Moderate LVH, EF 45-50%, AVR functioning appropriately, aortic valve mean gradient 21, diastolic dysfunction. Chest MRA (2/13): Mild to moderate dilatation at the sinus of Valsalva at 4.1 cm, mild dilatation ascending aorta distal to the tube graft at 3.9 cm, moderate dilatation of the innominate artery a 2.1 cm;    . Hx of cardiac cath     a. LHC in 02/2010: normal cors  . Hx of echocardiogram 2014    Echo (10/14): Severe LVH, EF 60-65%, normal wall motion, grade 1 diastolic dysfunction, AVR functioning normally, mild aortic stenosis (mean 19), moderately dilated aorta, mild LAE, mild RVE   Past Surgical History  Past Surgical History  Procedure Laterality Date  . Cardiac catheterization  03/02/2010    NORMAL CORONARY ARTERY  . Sternotomy      REDO  . Transthoracic echocardiogram  03/2010    SHOWED MILD REDUCTION OF LV FUNCTION  . Thyroidectomy    . Rotator cuff repair  2012    Right   Family History  Family History  Problem Relation Age of Onset  . Heart  disease Father    Social History  reports that he has never smoked. He has never used smokeless tobacco. He reports that he does not drink alcohol or use illicit drugs. Allergies No Known Allergies Home medications Prior to Admission medications   Medication Sig Start Date End Date Taking? Authorizing Provider  acetaminophen (TYLENOL) 500 MG tablet Take 500 mg by mouth every 6 (six) hours as needed for mild pain or fever.   Yes Historical Provider, MD  aspirin 325 MG tablet Take 325 mg by mouth daily.     Yes Historical Provider, MD  ibuprofen (ADVIL,MOTRIN) 200 MG tablet Take 200 mg by mouth every 6 (six) hours as needed for fever.   Yes Historical Provider, MD  levothyroxine (SYNTHROID, LEVOTHROID) 175 MCG tablet Take 175 mcg by mouth daily.   Yes Historical Provider, MD  lisinopril (PRINIVIL,ZESTRIL) 10 MG tablet Take 1 tablet (10 mg total) by mouth daily. 04/18/13  Yes [name], MD  metoprolol (LOPRESSOR) 50 MG tablet Take 25 mg by mouth 2 (two) times daily.   Yes Historical Provider, MD  multivitamin [facility]) per tablet Take 1 tablet by mouth daily.     Yes Historical Provider, MD  oseltamivir (TAMIFLU) 75 MG capsule Take 1 capsule (75 mg total) by mouth every 12 (twelve) hours. 09/06/13  Yes Blanchard Kelch, MD  sertraline (ZOLOFT) 50 MG tablet Take 50 mg by  mouth daily.   Yes Historical Provider, MD   Liver Function Tests  Recent Labs Lab 09/12/13 1730 09/13/13 0603 09/14/13 0445  AST 27 26 24   ALT 30 29 26   ALKPHOS 73 85 81  BILITOT 1.3* 1.2 1.5*  PROT 5.3* 5.4* 5.4*  ALBUMIN 1.9* 1.8* 1.7*   No results found for this basename: LIPASE, AMYLASE,  in the last 168 hours CBC  Recent Labs Lab 09/12/13 1420 09/13/13 0603 09/14/13 0445  WBC 26.9* 27.9* 26.5*  NEUTROABS 22.5* 24.0* 24.0*  HGB 13.4 13.0 12.8*  HCT 37.7* 37.0* 36.4*  MCV 84.5 84.9 84.7  PLT 48* 48* 49*   Basic Metabolic Panel  Recent Labs Lab 09/09/13 0545 09/10/13 0320 09/11/13 0546   09/12/13 0346  09/12/13 1420 09/12/13 1730  09/13/13 0603 09/13/13 0726 09/13/13 1241 09/13/13 1727 09/13/13 2045 09/14/13 0040 09/14/13 0445 09/14/13 1252  NA 134* 138 144  --  152*  < > 152* 148*  < > 147 146 145 143 144 141 141  --   K 3.0* 3.8 2.9*  < > 2.8*  --  2.9* 2.8*  --  3.1*  --  3.5*  --   --   --  2.9* 3.5*  CL 98 103 109  --  114*  --   --  112  --  110  --   --   --   --   --  103  --   CO2 21 23 25   --  23  --   --  24  --  24  --   --   --   --   --  21  --   GLUCOSE 184* 157* 148*  --  147*  --   --  255*  --  161*  --   --   --   --   --  158*  --   BUN 18 23 27*  --  33*  --   --  34*  --  39*  --   --   --   --   --  56*  --   CREATININE 0.93 1.00 1.01  --  1.19  --   --  1.31  --  1.89*  --   --   --   --   --  3.89*  --   CALCIUM 8.0* 8.0* 7.8*  --  8.1*  --   --  8.0*  --  7.8*  --   --   --   --   --  7.4*  --   PHOS  --   --   --   --   --   --   --   --   --   --   --   --   --   --   --  2.6  --   < > = values in this interval not displayed.  Exam  Blood pressure 119/103, pulse 113, temperature 98.8 F (37.1 C), temperature source Axillary, resp. rate 30, height 6\' 1"  (1.854 m), weight 100.7 kg (222 lb 0.1 oz), SpO2 95.00%. Lethargic, awake, responds but minimally No rash, cyanosis or gangrene Sclera anicteric, throat moist No jvd Chest occ fine rales at bases, mostly clear though RRR no rub or gallop, soft SEM Abd nontender, +BS, no ascites,benign GU w foley in place Ext trace edema dependent LE's Neuro lethargic, confused, awake  UA- 2/9 7-10wbc, 21-50rbc on admit  Assessment/Plan: 1  AKI from ATN due to gentamicin; less likely AIN from nafcillin. [name] has been stopped. Volume and BP up but no resp problems or pulm edema. Contiue IVF at 30/hr for now. Not in need of HD yet, may progress in next couple days. Will follow. Repeat UA, check UNa. 2 Staph aureus bacteremia / embolic strokes- suspected endocarditis 3 Congenital AoV disease, hx of tissue  AoV replacement 2011     Kelly Splinter MD (pgr) [phone]    [phone] 09/14/2013, 6:04 PM

## 2013-09-14 NOTE — Progress Notes (Signed)
Patient ID: Bradley [name], male   DOB: 1955-10-12, 58 y.o.   MRN: [id number]         [facility]    Date of Admission:  09/08/2013    Total days of antibiotics 6         Principal Problem:   Sepsis Active Problems:   HYPOTHYROIDISM- TSH 7.5   Aortic valve replaced- Bentall propceedure 2011   Hypertension   Depression   LBBB (left bundle branch block)   Major depressive disorder, recurrent, severe with psychotic features- hospitalized in Jan 2013   Generalized anxiety disorder   Altered mental state   Encephalitis   Septic embolism   HCAP (healthcare-associated pneumonia)   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   Acute diastolic heart failure   NSTEMI (non-ST elevated myocardial infarction)   Agitation   Acute respiratory alkalosis   Acute renal failure   Encephalopathy acute   Staphylococcus aureus bacteremia with sepsis   . antiseptic oral rinse  15 mL Mouth Rinse q12n4p  . chlorhexidine  15 mL Mouth Rinse BID  . insulin aspart  0-9 Units Subcutaneous 6 times per day  . levothyroxine  87.5 mcg Intravenous Daily  . nafcillin IV  2 g Intravenous Q4H  . sodium chloride  3 mL Intravenous Q12H    Subjective: He is much more calm and alert. He is interacting with family.   Past Medical History  Diagnosis Date  . Anxiety   . Depression   . History of thyroid cancer   . Hypertension   . Hypothyroidism, postsurgical   . Aortic stenosis     s/p Bentall with bioprosthetic AVR 02/2010; Last echo (9/11): Moderate LVH, EF 45-50%, AVR functioning appropriately, aortic valve mean gradient 21, diastolic dysfunction. Chest MRA (2/13): Mild to moderate dilatation at the sinus of Valsalva at 4.1 cm, mild dilatation ascending aorta distal to the tube graft at 3.9 cm, moderate dilatation of the innominate artery a 2.1 cm;    . Hx of cardiac cath     a. LHC in 02/2010: normal cors  . Hx of echocardiogram 2014    Echo (10/14): Severe LVH, EF 60-65%, normal wall motion,  grade 1 diastolic dysfunction, AVR functioning normally, mild aortic stenosis (mean 19), moderately dilated aorta, mild LAE, mild RVE    History  Substance Use Topics  . Smoking status: Never Smoker   . Smokeless tobacco: Never Used  . Alcohol Use: No    Family History  Problem Relation Age of Onset  . Heart disease Father     No Known Allergies  Objective: Temp:  [98.4 F (36.9 C)-101 F (38.3 C)] 98.4 F (36.9 C) (02/15 0600) Pulse Rate:  [67-117] 102 (02/15 1000) Resp:  [14-48] 21 (02/15 1000) BP: (79-158)/(53-123) 132/88 mmHg (02/15 1000) SpO2:  [93 %-100 %] 99 % (02/15 1000) Weight:  [100.7 kg (222 lb 0.1 oz)] 100.7 kg (222 lb 0.1 oz) (02/15 0404)  General: He is alert and talkative. He achieved most questions appropriately Skin: No rash Lungs: Clear Cor: Regular [id number] 2/6 systolic murmur heard best at the right sternal border Abdomen: Nontender   Lab Results Lab Results  Component Value Date   WBC 26.5* 09/14/2013   HGB 12.8* 09/14/2013   HCT 36.4* 09/14/2013   MCV 84.7 09/14/2013   PLT 49* 09/14/2013    Lab Results  Component Value Date   CREATININE 3.89* 09/14/2013   BUN 56* 09/14/2013   NA 141 09/14/2013   K 2.9*  09/14/2013   CL 103 09/14/2013   CO2 21 09/14/2013    Lab Results  Component Value Date   ALT 26 09/14/2013   AST 24 09/14/2013   ALKPHOS 81 09/14/2013   BILITOT 1.5* 09/14/2013      Microbiology: Recent Results (from the past 240 hour(s))  CULTURE, BLOOD (ROUTINE X 2)     Status: None   Collection Time    09/08/13 10:00 PM      Result Value Ref Range Status   Specimen Description BLOOD RIGHT ARM   Final   Special Requests BOTTLES DRAWN AEROBIC AND ANAEROBIC 5CC   Final   Culture  Setup Time     Final   Value: 09/09/2013 03:30     Performed at [facility]   Culture     Final   Value: STAPHYLOCOCCUS AUREUS     Note: RIFAMPIN AND GENTAMICIN SHOULD NOT BE USED AS SINGLE DRUGS FOR TREATMENT OF STAPH INFECTIONS.     Note: Gram Stain  Report Called to,Read Back By and Verified With: NEELY RICHARDSON @ 1220 09/09/13 BY KRAWS     Performed at [facility]   Report Status 09/11/2013 FINAL   Final   Organism ID, Bacteria STAPHYLOCOCCUS AUREUS   Final  URINE CULTURE     Status: None   Collection Time    09/08/13 10:03 PM      Result Value Ref Range Status   Specimen Description URINE, RANDOM   Final   Special Requests NONE   Final   Culture  Setup Time     Final   Value: 09/09/2013 04:59     Performed at [place]     Final   Value: >=100,000 COLONIES/ML     Performed at [facility]   Culture     Final   Value: STAPHYLOCOCCUS AUREUS     Note: RIFAMPIN AND GENTAMICIN SHOULD NOT BE USED AS SINGLE DRUGS FOR TREATMENT OF STAPH INFECTIONS.     Performed at [facility]   Report Status 09/11/2013 FINAL   Final   Organism ID, Bacteria STAPHYLOCOCCUS AUREUS   Final  CULTURE, BLOOD (ROUTINE X 2)     Status: None   Collection Time    09/08/13 11:36 PM      Result Value Ref Range Status   Specimen Description BLOOD RIGHT ANTECUBITAL   Final   Special Requests BOTTLES DRAWN AEROBIC AND ANAEROBIC 5CC   Final   Culture  Setup Time     Final   Value: 09/09/2013 03:29     Performed at [facility]   Culture     Final   Value: STAPHYLOCOCCUS AUREUS     Note: SUSCEPTIBILITIES PERFORMED ON PREVIOUS CULTURE WITHIN THE LAST 5 DAYS.     Note: Gram Stain Report Called to,Read Back By and Verified With:  NEELY RICHARDSON @ 1220 09/09/13 BY KRAWS     Performed at [facility]   Report Status 09/11/2013 FINAL   Final  MRSA PCR SCREENING     Status: None   Collection Time    09/09/13  4:44 AM      Result Value Ref Range Status   MRSA by PCR NEGATIVE  NEGATIVE Final   Comment:            The GeneXpert MRSA Assay (FDA     approved for NASAL specimens     only), is one component of a  comprehensive MRSA colonization     surveillance program. It is not     intended to  diagnose MRSA     infection nor to guide or     monitor treatment for     MRSA infections.  CULTURE, BLOOD (ROUTINE X 2)     Status: None   Collection Time    09/10/13 11:05 AM      Result Value Ref Range Status   Specimen Description BLOOD RIGHT HAND   Final   Special Requests BOTTLES DRAWN AEROBIC ONLY 5CC   Final   Culture  Setup Time     Final   Value: 09/10/2013 14:22     Performed at [facility]   Culture     Final   Value:        BLOOD CULTURE RECEIVED NO GROWTH TO DATE CULTURE WILL BE HELD FOR 5 DAYS BEFORE ISSUING A FINAL NEGATIVE REPORT     Performed at [facility]   Report Status PENDING   Incomplete  CULTURE, BLOOD (ROUTINE X 2)     Status: None   Collection Time    09/10/13 11:06 AM      Result Value Ref Range Status   Specimen Description BLOOD LEFT HAND   Final   Special Requests BOTTLES DRAWN AEROBIC AND ANAEROBIC 5CC   Final   Culture  Setup Time     Final   Value: 09/10/2013 14:27     Performed at [facility]   Culture     Final   Value:        BLOOD CULTURE RECEIVED NO GROWTH TO DATE CULTURE WILL BE HELD FOR 5 DAYS BEFORE ISSUING A FINAL NEGATIVE REPORT     Performed at [facility]   Report Status PENDING   Incomplete    Studies/Results: Ct Head Wo Contrast  09/12/2013   CLINICAL DATA:  Increased mental status change, evaluate for CVA versus worsening bleed.  EXAM: CT HEAD WITHOUT CONTRAST  TECHNIQUE: Contiguous axial images were obtained from the base of the skull through the vertex without intravenous contrast.  COMPARISON:  Prior MRI and and CT from 09/09/2013.  FINDINGS: Study is degraded by motion artifact.  Vasogenic edema as trending linear hyperdensity within the left parietal lobe is stable not significantly changed relative to the previous examination. Vague hypodensities involving the supratentorial brain and right cerebellar hemisphere are not significantly changed relative to recent MRI. No definite a new  intracranial infarct identified. Previously seen hyperdense foci within the mid bilateral frontal lobes are less conspicuous as compared to prior. No new intracranial hemorrhage. No mass lesion or midline shift. Ventricles are stable in size without evidence of hydrocephalus. No extra-axial fluid collection.  The calvarium is grossly intact. Orbits are normal. Paranasal sinuses and mastoid air cells remain clear.  IMPRESSION: 1. Limited study due to motion. No significant interval change in subacute left parietal infarct with small amount of associated hemorrhage. Additional multi focal infarcts involving the supratentorial brain and right cerebellar hemisphere are not significantly changed as well relative to recent MRI from 09/09/2013. No definite new intracranial infarct identified. No new intracranial hemorrhage.   Electronically Signed   By: Jeannine Boga M.D.   On: 09/12/2013 22:02   Dg Chest Port 1 View  09/13/2013   CLINICAL DATA:  Respiratory distress  EXAM: PORTABLE CHEST - 1 VIEW  COMPARISON:  DG CHEST 1V PORT dated 09/08/2013  FINDINGS: Evidence of CABG reidentified with moderate enlargement of  the cardiac silhouette and central vascular congestion. Retrocardiac opacity persists. No overt alveolar edema or other filling processes otherwise identified. Trace if any left pleural fluid. No acute osseous finding.  IMPRESSION: Cardiomegaly with central vascular congestion and persistent retrocardiac atelectasis or possibly early pneumonia.   Electronically Signed   By: Conchita Paris M.D.   On: 09/13/2013 13:06    Assessment: Neurologically he is markedly improved over the past 24 hours but he is developing progressive renal insufficiency. I will discontinue his gentamicin. I doubt he has interstitial nephritis due to nafcillin but will check urine for eosinophils. I will add IV rifampin.  Plan: 1. Continue nafcillin for now 2. Check urine for eosinophils 3. Discontinue gentamicin 4. Start  rifampin 300 mg IV every 8 hours  Michel Bickers, MD [facility] [place] [phone] pager   [phone] cell 09/14/2013, 12:05 PM

## 2013-09-14 NOTE — Progress Notes (Addendum)
[facility] PROGRESS NOTE  Bradley [name] [id number] DOB: 06/11/1956 DOA: 09/08/2013 PCP: [name], MD  Assessment/Plan:  Gram-positive cocci sepsis/septic brain emboli suspicious for infective endocarditis/? CAP/?? - Patient was admitted to ICU. Blood cultures x2 shows gram-positive cocci in clusters (MSSA).  -. Influenza panel PCR negative.  -Chest x-ray suggests early pneumonia of left lung base with small left pleural effusion.  -Transthoracic echo requested and pending.  -Infectious disease consulted. -Continue IV nafcillin and gentamicin; NOTE; 2/14 gentamicin on hold; gentamicin trough 2.7 (high)  - D5W decreased to 50 ml/hour   UTI -Urine culture: Positive MSSA - continue ABx ; gentamicin on hold   Acute encephalopathy  - Most likely secondary to severe sepsis and septic brain emboli.  -Continue antibiotics  -2/15 shakes cognition has improved today after being on Precedex majority of the 2/14 -  Severe thrombocytopenia  - Platelet counts have dropped from 100 (09/06/13) > 23 (09/09/13); 2/12= 26. Most likely secondary to severe sepsis. 2/15 INR= 3.89 . Fibrinogen 586.  -No schistocytes on peripheral smear.  -DIC panel positive  - HIT panel negative -Hematology consulted.  -No overt bleeding at this time.  -No antiplatelets or heparin products at this time.  -Trend daily CBCs.  -Transfuse if has overt bleeding or platelet count less than 10 K.   Hypokalemia  - 2/15 Replace intravenously 5 runs IV potassium -Recheck potassium at 1300 on 2/15  Leukocytosis  - Secondary to sepsis. Trend daily CBCs   Active MI  - 2/11 Troponin =2.26 -ProBNP increase to 70,000  -Await recommendations from cardiology do not believe they will chance cath given patient's continue sepsis, and CVA secondary to septic emboli.    Systolic and Diastolic CHF - Acute vs Chronic ? -DC normal saline; D5W 500 mL bolus x1 then start D5W 173ml/hour  Status post tissue AVR/LBBB   - Requested transthoracic 2-D echocardiogram.TEE when stable. - Cardiology consulted. TTE Eventually?    History of thyroid cancer, post thyroidectomy hypothyroidism  - Thyroid supplements. Elevated TSH may be secondary to acute illness. -Continue IV Synthroid for now secondary to patient's inability to swallow all medication -Obtain TSH/free T4/T3 not convinced this is sick euthyroid; patient may be truly hypothyroid.  -  Recommend followup in 4-6 weeks.    Hypertension  - Continue when necessary IV metoprolol. Hold lisinopril.   ? Left hemiparesis/Septic Emboli-hemorrhagic  -  Hyperglycemia  - Hemoglobin A1c = 5.7  -Continue sensitive SSI.   History of depression  - hold Zoloft secondary to acute encephalopathy.  Hyponatremia -DC normal saline -Bolus D5W 5 ML, then D5W at 134ml/hr -Every 4 hour NA check  Agitation -Increase Haldol 4 mg every 6 hour PRN agitation -Start morphine 1 mg every 4 hour PRN pain  Respiratory alkalosis -Patient readings of Cheyne-Stokes breathing. -Have consulted Dr. Chase Caller (PCCM) evaluate for possible intubation -Consider starting patient on beta blocker after returns to floor. -ADDENDUM; Dr. Ivery Quale) has started patient on Precedex 0.1 mcg/kg/hr will hold on starting any beta blocker at this time -2/15 alkalosis has improved overnight pH 7.49, down from 7.54  Renal failure -Spoke with Dr. Megan Salon (infectious disease) will hold gentamicin, until level obtained can be nephrotoxic  -2/15 INR= 1.37 -2/15 patient's renal failure continued to worsen will consult nephrology -Renal infarct secondary to septic emboli? Will obtain renal artery ultrasound    Code Status: Full Family Communication:  no family present for discussion of plan of care  Disposition Plan: Resolution of sepsis,   Consultants: Dr  Kathrynn Speed (Neurology)  Dr Lurline Del (Oncology) Dr Dorris Carnes (Cardiology) Dr. Roney Jaffe  (nephrology)    Procedures: 09/12/2013 CT head without contrast 1. Limited study due to motion. No significant interval change in  subacute left parietal infarct with small amount of associated  hemorrhage. Additional multi focal infarcts involving the  supratentorial brain and right cerebellar hemisphere are not  significantly changed as well relative to recent MRI from  09/09/2013. No definite new intracranial infarct identified. No new  intracranial hemorrhage.  PBS pathology read 09/10/2013 LEUKOCYTOSIS WITH NEUTROPHILIA, AND THROMBOCYTOPENIA.   MRI Brain 09/09/2013 Multiple bilateral supra tentorial and right cerebellar regions of  restricted motion some of which are partially hemorrhagic with  largest area left parietal lobe. In the present clinical setting,  these findings are consistent with acute partially hemorrhagic  infarcts and possibly related to septic emboli.  CT Head W/O Contrast 09/09/2013 1. Increasing edema around linear high attenuation in the left  parietal lobe. Followup MRI with and without contrast is recommended  for further assessment. The patient presents with an infectious  syndrome and septic emboli are in the differential considerations.  Infarct with a small amount of hemorrhage or cortical laminar  necrosis as well as developmental venous anomaly remain in the  differential considerations. MRI should be useful in further  characterization.  2. Dr. Leonel Ramsay called discuss the case at 0230 hr on the day of  Examination.  Echocardiogram 09/09/2013 - Left ventricle: septal and apical hypokinesis The cavity size was mildly dilated. - moderate LVH. Systolic function was mildly reduced.  -LVEF=  45% to 50%. - Aortic valve: Apparent tissue AVR. No abscess or vegetation seen.  -No AR or perivalvular leak and normal systolic gradients Suggest TEE if clinical suspicion for SBE high - Mitral valve: Mild regurgitation. - Left atrium: The atrium was  mildly dilated.     MRSA Neg PCR     Antibiotics: Gentamicin 2/10>>  Nafcillin 2/10>> Vancomycin 2/10>>2/12     HPI/Subjective: 58 year old male with history of aortic stenosis, status post pediatric aortic valvulotomy at age 55 followed by Bentall procedure 02/2000 with tissue AVR, cardiac cath 02/2010 with normal coronaries, LBBB, thyroid cancer-post thyroidectomy hypothyroidism, HTN and depression has been generally unwell for 2-3 weeks, recently seen in the ED on 09/06/13 and assessed as influenza related vomiting, diarrhea, abdominal pain, body aches, fever and mild confusion. Family was given option of hospitalization versus discharge home-family opted to go home and he was discharged on Tamiflu. However, patient worsened at home with worsening confusion/disorientation and was admitted with sepsis, septic brain embolization and rule out infective endocarditis. Family recently acquired a dog? Stray- 3 weeks ago. 2/11 states negative pain/SOB 2/12 patient A./O. x1, will follow some commands. Becomes agitated as the day progresses 2/13 no acute events overnight, patient has had increased agitation and danger of pulling out Foley/IV lines 2/14 patient A./O. x0 very agitated, thrashing, does not follow orders, does not attempt to communicate. 2/15 patient cognition improved after being on Precedex for the majority of 2/14. Precedex was turned off overnight secondary to patient developing a recurrent arrhythmia, Which resolved with the seeing of Precedex. Currently patient follows commands, A./O. x1.      Objective: Filed Vitals:   09/14/13 0200 09/14/13 0400 09/14/13 0404 09/14/13 0600  BP: 136/81 120/97  145/93  Pulse: 110 108  97  Temp:  98.6 F (37 C)  98.4 F (36.9 C)  TempSrc:  Axillary  Axillary  Resp: 25 23  48  Height:      Weight:   100.7 kg (222 lb 0.1 oz)   SpO2: 93% 96%  96%    Intake/Output Summary (Last 24 hours) at 09/14/13 0737 Last data filed at 09/14/13 0700   Gross per 24 hour  Intake 4212.93 ml  Output    104 ml  Net 4108.93 ml   Filed Weights   09/12/13 0400 09/13/13 0400 09/14/13 0404  Weight: 95.6 kg (210 lb 12.2 oz) 98.2 kg (216 lb 7.9 oz) 100.7 kg (222 lb 0.1 oz)    Exam:   General: A./O. x1, NAD,  Cardiovascular: Regular rhythm and rate ,positive systolic murmur 2/5, negative rubs or gallops, DP/PT pulse one plus bilateral  Respiratory: Coarse breath sounds bilateral   Abdomen: Soft, nontender, nondistended, plus bowel  Musculoskeletal: Negative pedal edema, negative ecchymosis  Neurologic; improved cognition overnight follows commands, moves all extremities spontaneously, patient responds to painful stimuli;     Data Reviewed: Basic Metabolic Panel:  Recent Labs Lab 09/10/13 0320 09/11/13 0546 09/11/13 0750  09/12/13 0346  09/12/13 1420 09/12/13 1730  09/13/13 0603  09/13/13 1241 09/13/13 1727 09/13/13 2045 09/14/13 0040 09/14/13 0445  NA 138 144  --   --  152*  < > 152* 148*  < > 147  < > 145 143 144 141 141  K 3.8 2.9*  --   < > 2.8*  --  2.9* 2.8*  --  3.1*  --  3.5*  --   --   --  2.9*  CL 103 109  --   --  114*  --   --  112  --  110  --   --   --   --   --  103  CO2 23 25  --   --  23  --   --  24  --  24  --   --   --   --   --  21  GLUCOSE 157* 148*  --   --  147*  --   --  255*  --  161*  --   --   --   --   --  158*  BUN 23 27*  --   --  33*  --   --  34*  --  39*  --   --   --   --   --  56*  CREATININE 1.00 1.01  --   --  1.19  --   --  1.31  --  1.89*  --   --   --   --   --  3.89*  CALCIUM 8.0* 7.8*  --   --  8.1*  --   --  8.0*  --  7.8*  --   --   --   --   --  7.4*  MG 2.2  --  2.2  --   --   --   --  2.4  --   --   --   --   --   --   --  2.3  PHOS  --   --   --   --   --   --   --   --   --   --   --   --   --   --   --  2.6  < > = values in this interval not displayed. Liver Function Tests:  Recent Labs Lab 09/10/13 0320  09/12/13 0346 09/12/13 1730 09/13/13 0603 09/14/13 0445  AST  41* 26 27 26 24   ALT 39 31 30 29 26   ALKPHOS 66 71 73 85 81  BILITOT 1.0 1.4* 1.3* 1.2 1.5*  PROT 5.4* 5.5* 5.3* 5.4* 5.4*  ALBUMIN 2.1* 2.0* 1.9* 1.8* 1.7*   No results found for this basename: LIPASE, AMYLASE,  in the last 168 hours  Recent Labs Lab 09/08/13 2336  AMMONIA 39   CBC:  Recent Labs Lab 09/11/13 0750 09/11/13 1703 09/12/13 1420 09/13/13 0603 09/14/13 0445  WBC 23.0* 23.2* 26.9* 27.9* 26.5*  NEUTROABS 19.8* 19.5* 22.5* 24.0* 24.0*  HGB 12.7* 13.0 13.4 13.0 12.8*  HCT 35.8* 36.6* 37.7* 37.0* 36.4*  MCV 84.8 83.8 84.5 84.9 84.7  PLT 26* 35* 48* 48* 49*   Cardiac Enzymes:  Recent Labs Lab 09/10/13 1500 09/10/13 2315 09/12/13 0913 09/12/13 1419 09/12/13 1730 09/12/13 2010  CKTOTAL  --   --   --   --  122  --   TROPONINI 2.05* 1.78* 1.52* 1.54*  --  1.39*   BNP (last 3 results)  Recent Labs  09/10/13 0753 09/14/13 0445  PROBNP 37349.0* >70000.0*   CBG:  Recent Labs Lab 09/13/13 1158 09/13/13 1730 09/13/13 2106 09/13/13 2330 09/14/13 0341  GLUCAP 128* 132* 123* 101* 143*    Recent Results (from the past 240 hour(s))  CULTURE, BLOOD (ROUTINE X 2)     Status: None   Collection Time    09/08/13 10:00 PM      Result Value Ref Range Status   Specimen Description BLOOD RIGHT ARM   Final   Special Requests BOTTLES DRAWN AEROBIC AND ANAEROBIC 5CC   Final   Culture  Setup Time     Final   Value: 09/09/2013 03:30     Performed at [facility]   Culture     Final   Value: STAPHYLOCOCCUS AUREUS     Note: RIFAMPIN AND GENTAMICIN SHOULD NOT BE USED AS SINGLE DRUGS FOR TREATMENT OF STAPH INFECTIONS.     Note: Gram Stain Report Called to,Read Back By and Verified With: NEELY RICHARDSON @ 1220 09/09/13 BY KRAWS     Performed at [facility]   Report Status 09/11/2013 FINAL   Final   Organism ID, Bacteria STAPHYLOCOCCUS AUREUS   Final  URINE CULTURE     Status: None   Collection Time    09/08/13 10:03 PM      Result Value Ref  Range Status   Specimen Description URINE, RANDOM   Final   Special Requests NONE   Final   Culture  Setup Time     Final   Value: 09/09/2013 04:59     Performed at [facility]     Final   Value: >=100,000 COLONIES/ML     Performed at [facility]   Culture     Final   Value: STAPHYLOCOCCUS AUREUS     Note: RIFAMPIN AND GENTAMICIN SHOULD NOT BE USED AS SINGLE DRUGS FOR TREATMENT OF STAPH INFECTIONS.     Performed at [facility]   Report Status 09/11/2013 FINAL   Final   Organism ID, Bacteria STAPHYLOCOCCUS AUREUS   Final  CULTURE, BLOOD (ROUTINE X 2)     Status: None   Collection Time    09/08/13 11:36 PM      Result Value Ref Range Status   Specimen Description BLOOD RIGHT ANTECUBITAL   Final   Special Requests  BOTTLES DRAWN AEROBIC AND ANAEROBIC 5CC   Final   Culture  Setup Time     Final   Value: 09/09/2013 03:29     Performed at [facility]   Culture     Final   Value: STAPHYLOCOCCUS AUREUS     Note: SUSCEPTIBILITIES PERFORMED ON PREVIOUS CULTURE WITHIN THE LAST 5 DAYS.     Note: Gram Stain Report Called to,Read Back By and Verified With:  NEELY RICHARDSON @ 1220 09/09/13 BY KRAWS     Performed at [facility]   Report Status 09/11/2013 FINAL   Final  MRSA PCR SCREENING     Status: None   Collection Time    09/09/13  4:44 AM      Result Value Ref Range Status   MRSA by PCR NEGATIVE  NEGATIVE Final   Comment:            The GeneXpert MRSA Assay (FDA     approved for NASAL specimens     only), is one component of a     comprehensive MRSA colonization     surveillance program. It is not     intended to diagnose MRSA     infection nor to guide or     monitor treatment for     MRSA infections.  CULTURE, BLOOD (ROUTINE X 2)     Status: None   Collection Time    09/10/13 11:05 AM      Result Value Ref Range Status   Specimen Description BLOOD RIGHT HAND   Final   Special Requests BOTTLES DRAWN AEROBIC ONLY 5CC    Final   Culture  Setup Time     Final   Value: 09/10/2013 14:22     Performed at [facility]   Culture     Final   Value:        BLOOD CULTURE RECEIVED NO GROWTH TO DATE CULTURE WILL BE HELD FOR 5 DAYS BEFORE ISSUING A FINAL NEGATIVE REPORT     Performed at [facility]   Report Status PENDING   Incomplete  CULTURE, BLOOD (ROUTINE X 2)     Status: None   Collection Time    09/10/13 11:06 AM      Result Value Ref Range Status   Specimen Description BLOOD LEFT HAND   Final   Special Requests BOTTLES DRAWN AEROBIC AND ANAEROBIC 5CC   Final   Culture  Setup Time     Final   Value: 09/10/2013 14:27     Performed at [facility]   Culture     Final   Value:        BLOOD CULTURE RECEIVED NO GROWTH TO DATE CULTURE WILL BE HELD FOR 5 DAYS BEFORE ISSUING A FINAL NEGATIVE REPORT     Performed at [facility]   Report Status PENDING   Incomplete     Studies: Ct Head Wo Contrast  09/12/2013   CLINICAL DATA:  Increased mental status change, evaluate for CVA versus worsening bleed.  EXAM: CT HEAD WITHOUT CONTRAST  TECHNIQUE: Contiguous axial images were obtained from the base of the skull through the vertex without intravenous contrast.  COMPARISON:  Prior MRI and and CT from 09/09/2013.  FINDINGS: Study is degraded by motion artifact.  Vasogenic edema as trending linear hyperdensity within the left parietal lobe is stable not significantly changed relative to the previous examination. Vague hypodensities involving the supratentorial brain and right cerebellar hemisphere are not significantly  changed relative to recent MRI. No definite a new intracranial infarct identified. Previously seen hyperdense foci within the mid bilateral frontal lobes are less conspicuous as compared to prior. No new intracranial hemorrhage. No mass lesion or midline shift. Ventricles are stable in size without evidence of hydrocephalus. No extra-axial fluid collection.  The calvarium is grossly  intact. Orbits are normal. Paranasal sinuses and mastoid air cells remain clear.  IMPRESSION: 1. Limited study due to motion. No significant interval change in subacute left parietal infarct with small amount of associated hemorrhage. Additional multi focal infarcts involving the supratentorial brain and right cerebellar hemisphere are not significantly changed as well relative to recent MRI from 09/09/2013. No definite new intracranial infarct identified. No new intracranial hemorrhage.   Electronically Signed   By: Jeannine Boga M.D.   On: 09/12/2013 22:02   Dg Chest Port 1 View  09/13/2013   CLINICAL DATA:  Respiratory distress  EXAM: PORTABLE CHEST - 1 VIEW  COMPARISON:  DG CHEST 1V PORT dated 09/08/2013  FINDINGS: Evidence of CABG reidentified with moderate enlargement of the cardiac silhouette and central vascular congestion. Retrocardiac opacity persists. No overt alveolar edema or other filling processes otherwise identified. Trace if any left pleural fluid. No acute osseous finding.  IMPRESSION: Cardiomegaly with central vascular congestion and persistent retrocardiac atelectasis or possibly early pneumonia.   Electronically Signed   By: Conchita Paris M.D.   On: 09/13/2013 13:06    Scheduled Meds: . antiseptic oral rinse  15 mL Mouth Rinse q12n4p  . chlorhexidine  15 mL Mouth Rinse BID  . insulin aspart  0-9 Units Subcutaneous 6 times per day  . levothyroxine  87.5 mcg Intravenous Daily  . nafcillin IV  2 g Intravenous Q4H  . potassium chloride  10 mEq Intravenous Q1 Hr x 2  . potassium chloride  10 mEq Intravenous Q1 Hr x 3  . sodium chloride  3 mL Intravenous Q12H   Continuous Infusions: . dexmedetomidine Stopped (09/13/13 1600)  . dextrose 50 mL/hr at 09/14/13 0550    Principal Problem:   Sepsis Active Problems:   HYPOTHYROIDISM- TSH 7.5   Aortic valve replaced- Bentall propceedure 2011   Hypertension   Depression   LBBB (left bundle branch block)   Major depressive  disorder, recurrent, severe with psychotic features- hospitalized in Jan 2013   Generalized anxiety disorder   Altered mental state   Encephalitis   Septic embolism   HCAP (healthcare-associated pneumonia)   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   Acute diastolic heart failure   NSTEMI (non-ST elevated myocardial infarction)   Agitation   Acute respiratory alkalosis   Acute renal failure   Encephalopathy acute   Staphylococcus aureus bacteremia with sepsis    Time spent: 2min    Aly Seidenberg, [name], J  [facility] Pager [phone]. If 7PM-7AM, please contact night-coverage at [email or web address], password [facility] 09/14/2013, 7:37 AM  LOS: 6 days

## 2013-09-14 NOTE — Progress Notes (Signed)
eLink Physician-Brief Progress Note Patient Name: Bradley [name] DOB: [birth year] MRN: [id number]  Date of Service  09/14/2013   HPI/Events of Note   Hypokalemia Hypernatremia resolved  eICU Interventions  KCl ordered D5W rate decreased   Intervention Category Major Interventions: Electrolyte abnormality - evaluation and management  [name] 09/14/2013, 5:43 AM

## 2013-09-14 NOTE — Consult Note (Signed)
PULMONARY  / CRITICAL CARE MEDICINE  Name: Bradley [name] MRN: [id number] DOB: 01-Jun-1956 PCP [name], MD    ADMISSION DATE:  09/08/2013  LOS 6 days   CONSULTATION DATE:  09/13/13  REFERRING MD :  DR Clydene Laming of [facility] PRIMARY SERVICE: [facility]  CHIEF COMPLAINT:  Encephalopathy. Intubation riks  BRIEF PATIENT DESCRIPTION:   58 year old male with s/p Benall with bioprosthetic AVF in august 2011. 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 Admitted 09/08/2013 with encephalopathy and septic picture with imaging showing septic embolli of brain and cultures revelaing MSSA bacteremia. Concern is for MSSA endocarditis but TEE unable to be done due to encephalopathy, and thrombocytopenia (Heme consult Dr Jana Hakim feels this is c/w infection). On 09/13/13 develping renal failure (d2 of gent Stopped) and worsening encephlopathy and PCCM consulted.    SIGNIFICANT EVENTS / STUDIES:  09/08/2013 - admit 09/09/13  - ID, Heme and Cards consult. ECHO ef 45%. No clear vegetations 2/14./15 - PCCM consult   LINES / TUBES: Left Midline PICC 09/13/13>>   CULTURES: 09/08/13 - Blood - STaph Aureus MSSA 09/08/13 - blood - Staph Aureus MSSA 09/09/13 - MRSA PCR - negative 09/10/13- blood >     ANTIBIOTICS: Zosyn 2./9 - 2/10 Cefrepime 2/10 >>2/10 Vanc 2/9 - 2/12 Nafcillin 2/10 >> Gent 2/12 >>2/14 (rise in creat)    SUBJECTIVE:   2/15: Had junctional rhythm with precedex and stopped but mental status significantly better. Tachypnea improved too. However, creat worse (post gent and 1 dose lasix, low urine op as well). Sister Horris Latino and Claiborne Billings at bedside: reports significant confusion in his medical details due to many family members receiving information and many MDs in care    VITAL SIGNS: Filed Vitals:   09/14/13 0200 09/14/13 0400 09/14/13 0404 09/14/13 0600  BP: 136/81 120/97  145/93  Pulse: 110 108  97  Temp:  98.6 F (37 C)  98.4 F  (36.9 C)  TempSrc:  Axillary  Axillary  Resp: 25 23  48  Height:      Weight:   100.7 kg (222 lb 0.1 oz)   SpO2: 93% 96%  96%        VENTILATOR SETTINGS: Vent Mode:  [-] PRVC FiO2 (%):  [30 %] 30 % Set Rate:  [28 bmp] 28 bmp Vt Set:  [420 mL] 420 mL PEEP:  [5 cmH20] 5 cmH20 Plateau Pressure:  [4 cmH20-21 cmH20] 20 cmH20 INTAKE / OUTPUT: I/O last 3 completed shifts: In: 6165.2 [I.V.:4802.9; Other:410; IV Piggyback:952.3] Out: 429 [Urine:425; Stool:4]     PHYSICAL EXAMINATION: General:  WEll built male. Looks unwell Neuro:Calm. Not restless. Sleeping. Woke up once and spoke appropriately HEENT:  Neck supple. Bearded male Cardiovascular:  Hypertensive, Normal heart sounds Lungs:  CTA bilaterally, 3L [place] Abdomen:  Soft, no mass, normalbowerl sounds Musculoskeletal:  No cyanosis, No clubbing.No edema. No janeway lesions Skin:  INtact anteriorly  LABS: PULMONARY  Recent Labs Lab 09/12/13 1650 09/13/13 0755 09/13/13 1315 09/13/13 2042 09/14/13 0745  PHART 7.543* 7.529* 7.470* 7.543* 7.496*  PCO2ART 28.7* 28.9* 33.6* 27.8* 28.7*  PO2ART 63.1* 72.4* 72.4* 67.3* 65.2*  HCO3 24.6* 24.0 24.2* 23.7 21.9  TCO2 21.5 20.8 21.2 20.5 19.0  O2SAT 94.6 96.2 95.9 95.5 94.8    CBC  Recent Labs Lab 09/12/13 1420 09/13/13 0603 09/14/13 0445  HGB 13.4 13.0 12.8*  HCT 37.7* 37.0* 36.4*  WBC 26.9* 27.9* 26.5*  PLT 48* 48* 49*  COAGULATION  Recent Labs Lab 09/10/13 0320 09/11/13 1327 09/12/13 1420 09/13/13 0603 09/14/13 0445  INR 1.10 1.20 1.23 1.34 1.37    CARDIAC    Recent Labs Lab 09/10/13 1500 09/10/13 2315 09/12/13 0913 09/12/13 1419 09/12/13 2010  TROPONINI 2.05* 1.78* 1.52* 1.54* 1.39*    Recent Labs Lab 09/10/13 0753 09/14/13 0445  PROBNP 37349.0* >70000.0*     CHEMISTRY  Recent Labs Lab 09/10/13 0320 09/11/13 0546 09/11/13 0750  09/12/13 0346  09/12/13 1730  09/13/13 0603  09/13/13 1241 09/13/13 1727 09/13/13 2045  09/14/13 0040 09/14/13 0445  NA 138 144  --   --  152*  < > 148*  < > 147  < > 145 143 144 141 141  K 3.8 2.9*  --   < > 2.8*  < > 2.8*  --  3.1*  --  3.5*  --   --   --  2.9*  CL 103 109  --   --  114*  --  112  --  110  --   --   --   --   --  103  CO2 23 25  --   --  23  --  24  --  24  --   --   --   --   --  21  GLUCOSE 157* 148*  --   --  147*  --  255*  --  161*  --   --   --   --   --  158*  BUN 23 27*  --   --  33*  --  34*  --  39*  --   --   --   --   --  56*  CREATININE 1.00 1.01  --   --  1.19  --  1.31  --  1.89*  --   --   --   --   --  3.89*  CALCIUM 8.0* 7.8*  --   --  8.1*  --  8.0*  --  7.8*  --   --   --   --   --  7.4*  MG 2.2  --  2.2  --   --   --  2.4  --   --   --   --   --   --   --  2.3  PHOS  --   --   --   --   --   --   --   --   --   --   --   --   --   --  2.6  < > = values in this interval not displayed. Estimated Creatinine Clearance: 26.5 ml/min (by C-G formula based on Cr of 3.89).   LIVER  Recent Labs Lab 09/10/13 0320 09/11/13 1327 09/12/13 0346 09/12/13 1420 09/12/13 1730 09/13/13 0603 09/14/13 0445  AST 41*  --  26  --  27 26 24   ALT 39  --  31  --  30 29 26   ALKPHOS 66  --  71  --  73 85 81  BILITOT 1.0  --  1.4*  --  1.3* 1.2 1.5*  PROT 5.4*  --  5.5*  --  5.3* 5.4* 5.4*  ALBUMIN 2.1*  --  2.0*  --  1.9* 1.8* 1.7*  INR 1.10 1.20  --  1.23  --  1.34 1.37     INFECTIOUS  Recent Labs Lab 09/08/13 2345 09/10/13  1500 09/13/13 1241 09/14/13 0445  LATICACIDVEN 4.16* 1.3 1.2  --   PROCALCITON  --   --  1.07 1.50     ENDOCRINE CBG (last 3)   Recent Labs  09/13/13 2106 09/13/13 2330 09/14/13 0341  GLUCAP 123* 101* 143*         IMAGING x48h  Ct Head Wo Contrast  09/12/2013   CLINICAL DATA:  Increased mental status change, evaluate for CVA versus worsening bleed.  EXAM: CT HEAD WITHOUT CONTRAST  TECHNIQUE: Contiguous axial images were obtained from the base of the skull through the vertex without intravenous contrast.   COMPARISON:  Prior MRI and and CT from 09/09/2013.  FINDINGS: Study is degraded by motion artifact.  Vasogenic edema as trending linear hyperdensity within the left parietal lobe is stable not significantly changed relative to the previous examination. Vague hypodensities involving the supratentorial brain and right cerebellar hemisphere are not significantly changed relative to recent MRI. No definite a new intracranial infarct identified. Previously seen hyperdense foci within the mid bilateral frontal lobes are less conspicuous as compared to prior. No new intracranial hemorrhage. No mass lesion or midline shift. Ventricles are stable in size without evidence of hydrocephalus. No extra-axial fluid collection.  The calvarium is grossly intact. Orbits are normal. Paranasal sinuses and mastoid air cells remain clear.  IMPRESSION: 1. Limited study due to motion. No significant interval change in subacute left parietal infarct with small amount of associated hemorrhage. Additional multi focal infarcts involving the supratentorial brain and right cerebellar hemisphere are not significantly changed as well relative to recent MRI from 09/09/2013. No definite new intracranial infarct identified. No new intracranial hemorrhage.   Electronically Signed   By: Jeannine Boga M.D.   On: 09/12/2013 22:02   Dg Chest Port 1 View  09/13/2013   CLINICAL DATA:  Respiratory distress  EXAM: PORTABLE CHEST - 1 VIEW  COMPARISON:  DG CHEST 1V PORT dated 09/08/2013  FINDINGS: Evidence of CABG reidentified with moderate enlargement of the cardiac silhouette and central vascular congestion. Retrocardiac opacity persists. No overt alveolar edema or other filling processes otherwise identified. Trace if any left pleural fluid. No acute osseous finding.  IMPRESSION: Cardiomegaly with central vascular congestion and persistent retrocardiac atelectasis or possibly early pneumonia.   Electronically Signed   By: Conchita Paris M.D.   On:  09/13/2013 13:06       ASSESSMENT / PLAN:  PULMONARY A: aT risk for acute resp failure and intubation due to encephalopathy. Might need intubation for TEE anwayways  2/15: Improved  P:   Closely monitor intubate for procedure or deteriorations  (I had paged Dr Acie Fredrickson yesterday to discuss this but have not been able to so far discuss with him)  CARDIOVASCULAR A: s/p BENTALL procedure 2011 Now  Type 2 NSTEMI and ? Acute systolic CHF  [id number]: stable but had junctional rhythm with precedex 24h ago  P:  Per cards ? CVTS needs to know (Dr Servando Snare was his surgeon_   RENAL A:  New renal failure 09/13/13 on d2 of gent  09/14/13: worse despite lasix x 1 dose. ? Anuric  P:   Gent stopped [facility] managing; adivsed to call renal consult today  GASTROINTESTINAL A:  ? NPO P:   Methanl status dictates NPO status  HEMATOLOGIC A:  Thrombocytopenia of sepsis 09/14/13: imprioving slowly  P:  Monitior Plat > 50 for CVL if needed  INFECTIOUS A:  MSSA bacteremia - source suspcted bioprosthetic aortic vavlve P:   Per ID  ENDOCRINE A:  Nil acute   P:   Per [facility]  NEUROLOGIC A:  Encephalopathic - impeding care  09/14/13: did not tolerated precedex but improved   P:   Monitor with reassurance If fails, intubate  TODAY'S SUMMARY:\  09/13/13:  Wife, sister and daughter updated. Wife crying inconsolably. He is at risk for intubation. They understanmd prognosis poor or it will be a long haul  09/14/13: sisters updated. D/w Dr Sherral Hammers measures to coordinate communication     The patient is critically ill with multiple organ systems failure and requires high complexity decision making for assessment and support, frequent evaluation and titration of therapies, application of advanced monitoring technologies and extensive interpretation of multiple databases.   Critical Care Time devoted to patient care services described in this note is  35  Minutes.   Dr. Brand Males,  M.D., [facility].C.P Pulmonary and Critical Care Medicine Staff Physician [place] [facility] Pager: [phone], If no answer or between  15:00h - 7:00h: call [phone]  09/14/2013 8:50 AM

## 2013-09-14 NOTE — Progress Notes (Signed)
Patient Name: Bradley [name] Date of Encounter: 09/14/2013    SUBJECTIVE:  58 yo with hx of Bentall procedure admitted with altered mental status.  Hx of hypothyroidism, hypertension, and depression. Echo:  Left ventricle: septal and apical hypokinesis The cavity size was mildly dilated. Wall thickness was increased in a pattern of moderate LVH. Systolic function was mildly reduced. The estimated ejection fraction was in the range of 45% to 50%. - Aortic valve: Apparent tissue AVR. No abscess or vegetation seen. No AR or perivalvular leak and normal systolic gradients Suggest TEE if clinical suspicion for SBE high - Mitral valve: Mild regurgitation. - Left atrium: The atrium was mildly dilated.  Mentally is more awake today.  Responded to questions.   TELEMETRY:  Sinus tachycardia Filed Vitals:   09/14/13 0200 09/14/13 0400 09/14/13 0404 09/14/13 0600  BP: 136/81 120/97  145/93  Pulse: 110 108  97  Temp:  98.6 F (37 C)  98.4 F (36.9 C)  TempSrc:  Axillary  Axillary  Resp: 25 23  48  Height:      Weight:   222 lb 0.1 oz (100.7 kg)   SpO2: 93% 96%  96%    Intake/Output Summary (Last 24 hours) at 09/14/13 0818 Last data filed at 09/14/13 0700  Gross per 24 hour  Intake 3962.93 ml  Output     44 ml  Net 3918.93 ml    LABS: Basic Metabolic Panel:  Recent Labs  09/12/13 1730  09/13/13 0603  09/13/13 1241  09/14/13 0040 09/14/13 0445  NA 148*  < > 147  < > 145  < > 141 141  K 2.8*  --  3.1*  --  3.5*  --   --  2.9*  CL 112  --  110  --   --   --   --  103  CO2 24  --  24  --   --   --   --  21  GLUCOSE 255*  --  161*  --   --   --   --  158*  BUN 34*  --  39*  --   --   --   --  56*  CREATININE 1.31  --  1.89*  --   --   --   --  3.89*  CALCIUM 8.0*  --  7.8*  --   --   --   --  7.4*  MG 2.4  --   --   --   --   --   --  2.3  PHOS  --   --   --   --   --   --   --  2.6  < > = values in this interval not displayed. CBC:  Recent Labs   09/13/13 0603 09/14/13 0445  WBC 27.9* 26.5*  NEUTROABS 24.0* 24.0*  HGB 13.0 12.8*  HCT 37.0* 36.4*  MCV 84.9 84.7  PLT 48* 49*   Cardiac Enzymes:  Recent Labs  09/12/13 0913 09/12/13 1419 09/12/13 1730 09/12/13 2010  CKTOTAL  --   --  122  --   TROPONINI 1.52* 1.54*  --  1.39*    Radiology/Studies:  No new data  Physical Exam: Blood pressure 145/93, pulse 97, temperature 98.4 F (36.9 C), temperature source Axillary, resp. rate 48, height 6\' 1"  (1.854 m), weight 222 lb 0.1 oz (100.7 kg), SpO2 96.00%. Weight change: 5 lb 8.2 oz (2.5 kg)  Lungs: clear Cor RR,  2/6 systolic murmur Abd: NT. Ext. No edema   ASSESSMENT:  1. Suspected bacterial endocarditis on a Bentall bioprosthetic device. 2. Altered mental status/encephalopathy with evidence of cerebral emboli - his mental status has greatly improved. 3. Thrombocytopenia 4. Flat troponin curve suggests type II non-ST elevation MI  Plan:  1. Continue antibiotic therapy and supportive therapy 2. We will continue to follow 3. Transesophageal echo is still unsafe given a low platelet count and the patient's critical illness.  Continue empiric abx for now.   His BMP shows acute renal insufficiency.   PCCM to address.    Thayer Headings, Brooke Bonito., MD, [facility] 09/14/2013, 8:20 AM Office - [phone] Pager [phone]

## 2013-09-15 ENCOUNTER — Inpatient Hospital Stay (HOSPITAL_COMMUNITY): Payer: Medicaid Other

## 2013-09-15 DIAGNOSIS — E876 Hypokalemia: Secondary | ICD-10-CM

## 2013-09-15 LAB — CBC WITH DIFFERENTIAL/PLATELET
Basophils Absolute: 0 10*3/uL (ref 0.0–0.1)
Basophils Relative: 0 % (ref 0–1)
Eosinophils Absolute: 0.3 10*3/uL (ref 0.0–0.7)
Eosinophils Relative: 1 % (ref 0–5)
HCT: 37.4 % — ABNORMAL LOW (ref 39.0–52.0)
Hemoglobin: 13.5 g/dL (ref 13.0–17.0)
Lymphocytes Relative: 3 % — ABNORMAL LOW (ref 12–46)
Lymphs Abs: 0.8 10*3/uL (ref 0.7–4.0)
MCH: 29.7 pg (ref 26.0–34.0)
MCHC: 36.1 g/dL — ABNORMAL HIGH (ref 30.0–36.0)
MCV: 82.4 fL (ref 78.0–100.0)
Monocytes Absolute: 1 10*3/uL (ref 0.1–1.0)
Monocytes Relative: 4 % (ref 3–12)
Neutro Abs: 22.9 10*3/uL — ABNORMAL HIGH (ref 1.7–7.7)
Neutrophils Relative %: 92 % — ABNORMAL HIGH (ref 43–77)
Platelets: 81 10*3/uL — ABNORMAL LOW (ref 150–400)
RBC: 4.54 MIL/uL (ref 4.22–5.81)
RDW: 15.5 % (ref 11.5–15.5)
WBC: 25 10*3/uL — ABNORMAL HIGH (ref 4.0–10.5)

## 2013-09-15 LAB — GLUCOSE, CAPILLARY
Glucose-Capillary: 110 mg/dL — ABNORMAL HIGH (ref 70–99)
Glucose-Capillary: 108 mg/dL — ABNORMAL HIGH (ref 70–99)
Glucose-Capillary: 118 mg/dL — ABNORMAL HIGH (ref 70–99)
Glucose-Capillary: 118 mg/dL — ABNORMAL HIGH (ref 70–99)
Glucose-Capillary: 141 mg/dL — ABNORMAL HIGH (ref 70–99)

## 2013-09-15 LAB — PROTIME-INR
INR: 1.17 (ref 0.00–1.49)
Prothrombin Time: 14.7 seconds (ref 11.6–15.2)

## 2013-09-15 LAB — COMPREHENSIVE METABOLIC PANEL
ALT: 24 U/L (ref 0–53)
AST: 23 U/L (ref 0–37)
Albumin: 1.5 g/dL — ABNORMAL LOW (ref 3.5–5.2)
Alkaline Phosphatase: 84 U/L (ref 39–117)
BUN: 67 mg/dL — ABNORMAL HIGH (ref 6–23)
CO2: 19 mEq/L (ref 19–32)
Calcium: 7.5 mg/dL — ABNORMAL LOW (ref 8.4–10.5)
Chloride: 101 mEq/L (ref 96–112)
Creatinine, Ser: 5.78 mg/dL — ABNORMAL HIGH (ref 0.50–1.35)
GFR calc Af Amer: 11 mL/min — ABNORMAL LOW (ref >90)
GFR calc non Af Amer: 10 mL/min — ABNORMAL LOW (ref >90)
Glucose, Bld: 131 mg/dL — ABNORMAL HIGH (ref 70–99)
Potassium: 3.5 mEq/L — ABNORMAL LOW (ref 3.7–5.3)
Sodium: 139 mEq/L (ref 137–147)
Total Bilirubin: 2.2 mg/dL — ABNORMAL HIGH (ref 0.3–1.2)
Total Protein: 5.7 g/dL — ABNORMAL LOW (ref 6.0–8.3)

## 2013-09-15 LAB — CLOSTRIDIUM DIFFICILE BY PCR: Toxigenic C. Difficile by PCR: NEGATIVE

## 2013-09-15 LAB — MRSA PCR SCREENING: MRSA by PCR: NEGATIVE

## 2013-09-15 LAB — PROCALCITONIN: Procalcitonin: 2.42 ng/mL

## 2013-09-15 LAB — PHOSPHORUS: Phosphorus: 4.5 mg/dL (ref 2.3–4.6)

## 2013-09-15 LAB — MAGNESIUM: Magnesium: 2.4 mg/dL (ref 1.5–2.5)

## 2013-09-15 MED ORDER — POTASSIUM CHLORIDE 10 MEQ/100ML IV SOLN
10.0000 meq | INTRAVENOUS | Status: AC
  Administered 2013-09-15 (×2): 10 meq via INTRAVENOUS
  Filled 2013-09-15 (×2): qty 100

## 2013-09-15 MED ORDER — LEVOTHYROXINE SODIUM 100 MCG IV SOLR
100.0000 ug | Freq: Every day | INTRAVENOUS | Status: DC
  Administered 2013-09-15 – 2013-09-19 (×5): 100 ug via INTRAVENOUS
  Filled 2013-09-15 (×6): qty 5

## 2013-09-15 NOTE — Progress Notes (Addendum)
Patient ID: Bradley [name], male   DOB: [birth year], 58 y.o.   MRN: [id number]         [facility]    Date of Admission:  09/08/2013    Total days of antibiotics 8         Principal Problem:   Sepsis Active Problems:   HYPOTHYROIDISM- TSH 7.5   Aortic valve replaced- Bentall propceedure 2011   Hypertension   Depression   LBBB (left bundle branch block)   Major depressive disorder, recurrent, severe with psychotic features- hospitalized in Jan 2013   Generalized anxiety disorder   Altered mental state   Encephalitis   Septic embolism   HCAP (healthcare-associated pneumonia)   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   Acute diastolic heart failure   NSTEMI (non-ST elevated myocardial infarction)   Agitation   Acute respiratory alkalosis   Acute renal failure   Encephalopathy acute   Staphylococcus aureus bacteremia with sepsis   . antiseptic oral rinse  15 mL Mouth Rinse q12n4p  . chlorhexidine  15 mL Mouth Rinse BID  . insulin aspart  0-9 Units Subcutaneous 6 times per day  . levothyroxine  87.5 mcg Intravenous Daily  . nafcillin IV  2 g Intravenous Q4H  . rifampin (RIFADIN) IVPB  300 mg Intravenous 3 times per day  . sodium chloride  3 mL Intravenous Q12H    Subjective: He is much more calm and alert but does not answer questions appropriately   Past Medical History  Diagnosis Date  . Anxiety   . Depression   . History of thyroid cancer   . Hypertension   . Hypothyroidism, postsurgical   . Aortic stenosis     s/p Bentall with bioprosthetic AVR 02/2010; Last echo (9/11): Moderate LVH, EF 45-50%, AVR functioning appropriately, aortic valve mean gradient 21, diastolic dysfunction. Chest MRA (2/13): Mild to moderate dilatation at the sinus of Valsalva at 4.1 cm, mild dilatation ascending aorta distal to the tube graft at 3.9 cm, moderate dilatation of the innominate artery a 2.1 cm;    . Hx of cardiac cath     a. LHC in 02/2010: normal cors  . Hx of  echocardiogram 2014    Echo (10/14): Severe LVH, EF 60-65%, normal wall motion, grade 1 diastolic dysfunction, AVR functioning normally, mild aortic stenosis (mean 19), moderately dilated aorta, mild LAE, mild RVE    History  Substance Use Topics  . Smoking status: Never Smoker   . Smokeless tobacco: Never Used  . Alcohol Use: No    Family History  Problem Relation Age of Onset  . Heart disease Father     No Known Allergies  Objective: Temp:  [98.5 F (36.9 C)-100.1 F (37.8 C)] 98.5 F (36.9 C) (02/16 0800) Pulse Rate:  [102-116] 102 (02/16 0800) Resp:  [16-41] 22 (02/16 0800) BP: (119-178)/(65-107) 133/89 mmHg (02/16 0800) SpO2:  [93 %-99 %] 99 % (02/16 0800) Weight:  [225 lb 5 oz (102.2 kg)] 225 lb 5 oz (102.2 kg) (02/16 0352)  General: He is alert and nodding his head appropriately to questions, but no verbal answers Skin: No rash Lungs: Clear CTAB Cor: Regular [id number] 2/6 systolic murmur heard best at the right sternal border Abdomen: Nontender   Lab Results Lab Results  Component Value Date   WBC 25.0* 09/15/2013   HGB 13.5 09/15/2013   HCT 37.4* 09/15/2013   MCV 82.4 09/15/2013   PLT 81* 09/15/2013    Lab Results  Component Value Date  CREATININE 5.78* 09/15/2013   BUN 67* 09/15/2013   NA 139 09/15/2013   K 3.5* 09/15/2013   CL 101 09/15/2013   CO2 19 09/15/2013    Lab Results  Component Value Date   ALT 24 09/15/2013   AST 23 09/15/2013   ALKPHOS 84 09/15/2013   BILITOT 2.2* 09/15/2013      Microbiology: 2/9 blood cx MSSA 2/9 urine cx MSSA 2/11 blood cx NGTD 2/16 cdiff negative  Assessment: Presumed MSSA prosthetic valve endocarditis with cardioembolic phenomenon/CNS infart partial hemorrhagic. Previously on nafcillin-gent but has developed AKI, continues to worsen. Now on nafcillin and rifampin. Thrombocytopenia improving  Plan: 1. Continue nafcillin 2gm Iv Q4hr, no need for renal dose adjustment at this time 2.  urine for eosinophils pending looking  for AIN 3. Defer to renal to transfer to [facility] ICU due to possible need for CRRT if kidney function worsens 4. Encephalopathy improved especially with correction of hypernatremia. Defer to neurology to see if need to have repeat NCHCT to see if any CNS process is worsening 5. TEE still needs to be done, likely by the end of the week as his platelets continue to improve and his mental status. Concern that giving him sedation will lead to inability to protect airway and intubation  Dr. Megan Salon to follow up at [facility]  Carlyle Basques, [place] for [place] [phone] pgr [phone] cell 09/15/2013, 11:08 AM

## 2013-09-15 NOTE — Progress Notes (Addendum)
PULMONARY  / CRITICAL CARE MEDICINE  Name: Bradley [name] MRN: [id number] DOB: 1955-09-29 PCP [name], MD ADMISSION DATE:  09/08/2013  LOS 7 days   CONSULTATION DATE:  09/13/13  REFERRING MD :  DR Clydene Laming of [facility] PRIMARY SERVICE: [facility]  CHIEF COMPLAINT:  Encephalopathy. Intubation riks  BRIEF PATIENT DESCRIPTION:  58 year old male with s/p Bentall with bioprosthetic AV in august 2011. 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 Admitted 09/08/2013 with encephalopathy and septic picture with imaging showing septic embolli of brain and cultures revelaing MSSA bacteremia. Concern is for MSSA endocarditis but TEE unable to be done due to encephalopathy, and thrombocytopenia (Heme consult Dr Jana Hakim feels this is c/w infection). On 09/13/13 develping renal failure (d2 of gent stopped) and worsening encephlopathy and PCCM consulted.    SIGNIFICANT EVENTS / STUDIES:  2/09 - admit 2/10 - ID, Heme and Cards consult. ECHO ef 45%. No clear vegetations 2/14 - PCCM consult 2/15 - Junctional rhythm with precedex, mental status improved  LINES / TUBES: Left Midline PICC 09/13/13>>  CULTURES: 2/09 - Blood - Staph Aureus MSSA 2/09 - blood - Staph Aureus MSSA 2/10 - MRSA PCR - negative 2/11 - blood >>>ngtd>>  ANTIBIOTICS: Zosyn 2./9 - 2/10 Cefrepime 2/10 >>2/10 Vanc 2/9 - 2/12 Nafcillin 2/10 >> Gent 2/12 >>2/14 (rise in creat)  SUBJECTIVE: Wife reports improvement in mental status.  No acute events.  Remains sleepy, arouses easy & follows commands afebrile    VITAL SIGNS: Filed Vitals:   09/15/13 0200 09/15/13 0352 09/15/13 0400 09/15/13 0600  BP: 130/89  124/65 135/96  Pulse: 116  107 107  Temp:   99.5 F (37.5 C)   TempSrc:   Axillary   Resp: 25  30 29   Height:      Weight:  225 lb 5 oz (102.2 kg)    SpO2: 94%  97% 95%   INTAKE / OUTPUT: I/O last 3 completed shifts: In: 3407 [I.V.:2357; IV Piggyback:1050] Out: 75  [Urine:72; Stool:3]  PHYSICAL EXAMINATION: General:  WEll built male. Looks unwell Neuro:Calm. Not restless. Sleeping. Woke up once and spoke appropriately, no asterexis HEENT:  Neck supple. Bearded male Cardiovascular:  Hypertensive, Normal heart sounds Lungs:  CTA bilaterally, 3L [place] Abdomen:  Soft, no mass, normalbowerl sounds Musculoskeletal:  No cyanosis, No clubbing.No edema. No janeway lesions Skin:  INtact anteriorly  LABS: PULMONARY  Recent Labs Lab 09/12/13 1650 09/13/13 0755 09/13/13 1315 09/13/13 2042 09/14/13 0745  PHART 7.543* 7.529* 7.470* 7.543* 7.496*  PCO2ART 28.7* 28.9* 33.6* 27.8* 28.7*  PO2ART 63.1* 72.4* 72.4* 67.3* 65.2*  HCO3 24.6* 24.0 24.2* 23.7 21.9  TCO2 21.5 20.8 21.2 20.5 19.0  O2SAT 94.6 96.2 95.9 95.5 94.8   CBC  Recent Labs Lab 09/13/13 0603 09/14/13 0445 09/15/13 0341  HGB 13.0 12.8* 13.5  HCT 37.0* 36.4* 37.4*  WBC 27.9* 26.5* 25.0*  PLT 48* 49* 81*   COAGULATION  Recent Labs Lab 09/11/13 1327 09/12/13 1420 09/13/13 0603 09/14/13 0445 09/15/13 0341  INR 1.20 1.23 1.34 1.37 1.17   CARDIAC   Recent Labs Lab 09/10/13 1500 09/10/13 2315 09/12/13 0913 09/12/13 1419 09/12/13 2010  TROPONINI 2.05* 1.78* 1.52* 1.54* 1.39*    Recent Labs Lab 09/10/13 0753 09/14/13 0445  PROBNP 37349.0* >70000.0*   CHEMISTRY  Recent Labs Lab 09/10/13 0320  09/11/13 0750  09/12/13 0346  09/12/13 1730  09/13/13 0603  09/13/13 1727 09/13/13 2045 09/14/13 0040 09/14/13 0445 09/14/13  1252 09/15/13 0341  NA 138  < >  --   --  152*  < > 148*  < > 147  < > 143 144 141 141  --  139  K 3.8  < >  --   < > 2.8*  < > 2.8*  --  3.1*  < >  --   --   --  2.9* 3.5* 3.5*  CL 103  < >  --   --  114*  --  112  --  110  --   --   --   --  103  --  101  CO2 23  < >  --   --  23  --  24  --  24  --   --   --   --  21  --  19  GLUCOSE 157*  < >  --   --  147*  --  255*  --  161*  --   --   --   --  158*  --  131*  BUN 23  < >  --   --  33*  --   34*  --  39*  --   --   --   --  56*  --  67*  CREATININE 1.00  < >  --   --  1.19  --  1.31  --  1.89*  --   --   --   --  3.89*  --  5.78*  CALCIUM 8.0*  < >  --   --  8.1*  --  8.0*  --  7.8*  --   --   --   --  7.4*  --  7.5*  MG 2.2  --  2.2  --   --   --  2.4  --   --   --   --   --   --  2.3  --  2.4  PHOS  --   --   --   --   --   --   --   --   --   --   --   --   --  2.6  --  4.5  < > = values in this interval not displayed. Estimated Creatinine Clearance: 17.9 ml/min (by C-G formula based on Cr of 5.78).  LIVER  Recent Labs Lab 09/11/13 1327 09/12/13 0346 09/12/13 1420 09/12/13 1730 09/13/13 0603 09/14/13 0445 09/15/13 0341  AST  --  26  --  27 26 24 23   ALT  --  31  --  30 29 26 24   ALKPHOS  --  71  --  73 85 81 84  BILITOT  --  1.4*  --  1.3* 1.2 1.5* 2.2*  PROT  --  5.5*  --  5.3* 5.4* 5.4* 5.7*  ALBUMIN  --  2.0*  --  1.9* 1.8* 1.7* 1.5*  INR 1.20  --  1.23  --  1.34 1.37 1.17   INFECTIOUS  Recent Labs Lab 09/08/13 2345 09/10/13 1500 09/13/13 1241 09/14/13 0445 09/15/13 0341  LATICACIDVEN 4.16* 1.3 1.2  --   --   PROCALCITON  --   --  1.07 1.50 2.42   ENDOCRINE CBG (last 3)   Recent Labs  09/14/13 2303 09/15/13 0347 09/15/13 0740  GLUCAP 91 110* 108*    IMAGING x48h  Ir Veno/ext/uni Right  09/14/2013   CLINICAL DATA:  Poor venous access,  request for PICC  EXAM: DUAL LUMEN LEFT MIDLINE VENOUS CATHETER PLACEMENT WITH ULTRASOUND AND FLUOROSCOPIC GUIDANCE  ADDITIONAL VENOGRAPHY OF THE RIGHT UPPER EXTREMITY  FLUOROSCOPY TIME:  7 MIN AND 30 SECONDS.  PROCEDURE: The patient's family member was advised of the possible risks and complications and agreed to undergo the procedure. The patient was then brought to the angiographic suite for the procedure.  The right and left arm was prepped with chlorhexidine, draped in the usual sterile fashion using maximum barrier technique (cap and mask, sterile gown, sterile gloves, large sterile sheet, hand hygiene and  cutaneous antisepsis) and infiltrated locally with 1% Lidocaine.  Ultrasound demonstrated patency of the right and left brachial vein, and this was documented with an image. Under real-time ultrasound guidance, the right brachial vein was accessed with a 21 gauge micropuncture needle and image documentation was performed. A 0.018 wire was introduced in to the vein. Over this, a dilator introducer was placed, the wire was unable to be advanced proximally so a venogram was performed with 10 cc of Omnipaque, this revealed venous extravasation. The right brachial vein site was abandoned and manual pressure was held until the site was soft with no continued bleeding. The left brachial vein was accessed with a 21 gauge micropuncture needle and multiple guidewires used to advance to the SVC/RA junction. A 5 French dual lumen power-injectable PICC was advanced, however unable to cross proximally to the lower SVC/right atrial junction, the PICC was left midline for this reason. Fluoroscopy during the procedure and fluoro spot radiograph confirms appropriate catheter position. The catheter aspirated and flushed well and was then covered with a sterile dressing.  Complications: Extravasation of 10 cc of Omnipaque to right brachial vein. Unable to advance left brachial vein PICC to the SVC/RA junction, left brachial PICC was placed midline.  IMPRESSION: Successful left arm midline VENOUS CATHETER placement with ultrasound and fluoroscopic guidance. The catheter is ready for use.  Read By:  Tsosie Billing PA-C   Electronically Signed   By: Aletta Edouard M.D.   On: 09/14/2013 11:51   Ir Fluoro Guide Cv Line Left  09/14/2013   CLINICAL DATA:  Poor venous access, request for PICC  EXAM: DUAL LUMEN LEFT MIDLINE VENOUS CATHETER PLACEMENT WITH ULTRASOUND AND FLUOROSCOPIC GUIDANCE  ADDITIONAL VENOGRAPHY OF THE RIGHT UPPER EXTREMITY  FLUOROSCOPY TIME:  7 MIN AND 30 SECONDS.  PROCEDURE: The patient's family member was advised of the  possible risks and complications and agreed to undergo the procedure. The patient was then brought to the angiographic suite for the procedure.  The right and left arm was prepped with chlorhexidine, draped in the usual sterile fashion using maximum barrier technique (cap and mask, sterile gown, sterile gloves, large sterile sheet, hand hygiene and cutaneous antisepsis) and infiltrated locally with 1% Lidocaine.  Ultrasound demonstrated patency of the right and left brachial vein, and this was documented with an image. Under real-time ultrasound guidance, the right brachial vein was accessed with a 21 gauge micropuncture needle and image documentation was performed. A 0.018 wire was introduced in to the vein. Over this, a dilator introducer was placed, the wire was unable to be advanced proximally so a venogram was performed with 10 cc of Omnipaque, this revealed venous extravasation. The right brachial vein site was abandoned and manual pressure was held until the site was soft with no continued bleeding. The left brachial vein was accessed with a 21 gauge micropuncture needle and multiple guidewires used to advance to the SVC/RA junction.  A 5 French dual lumen power-injectable PICC was advanced, however unable to cross proximally to the lower SVC/right atrial junction, the PICC was left midline for this reason. Fluoroscopy during the procedure and fluoro spot radiograph confirms appropriate catheter position. The catheter aspirated and flushed well and was then covered with a sterile dressing.  Complications: Extravasation of 10 cc of Omnipaque to right brachial vein. Unable to advance left brachial vein PICC to the SVC/RA junction, left brachial PICC was placed midline.  IMPRESSION: Successful left arm midline VENOUS CATHETER placement with ultrasound and fluoroscopic guidance. The catheter is ready for use.  Read By:  Tsosie Billing PA-C   Electronically Signed   By: Aletta Edouard M.D.   On: 09/14/2013 11:51    Ir US Guide Vasc Access Left  09/14/2013   CLINICAL DATA:  Poor venous access, request for PICC  EXAM: DUAL LUMEN LEFT MIDLINE VENOUS CATHETER PLACEMENT WITH ULTRASOUND AND FLUOROSCOPIC GUIDANCE  ADDITIONAL VENOGRAPHY OF THE RIGHT UPPER EXTREMITY  FLUOROSCOPY TIME:  7 MIN AND 30 SECONDS.  PROCEDURE: The patient's family member was advised of the possible risks and complications and agreed to undergo the procedure. The patient was then brought to the angiographic suite for the procedure.  The right and left arm was prepped with chlorhexidine, draped in the usual sterile fashion using maximum barrier technique (cap and mask, sterile gown, sterile gloves, large sterile sheet, hand hygiene and cutaneous antisepsis) and infiltrated locally with 1% Lidocaine.  Ultrasound demonstrated patency of the right and left brachial vein, and this was documented with an image. Under real-time ultrasound guidance, the right brachial vein was accessed with a 21 gauge micropuncture needle and image documentation was performed. A 0.018 wire was introduced in to the vein. Over this, a dilator introducer was placed, the wire was unable to be advanced proximally so a venogram was performed with 10 cc of Omnipaque, this revealed venous extravasation. The right brachial vein site was abandoned and manual pressure was held until the site was soft with no continued bleeding. The left brachial vein was accessed with a 21 gauge micropuncture needle and multiple guidewires used to advance to the SVC/RA junction. A 5 French dual lumen power-injectable PICC was advanced, however unable to cross proximally to the lower SVC/right atrial junction, the PICC was left midline for this reason. Fluoroscopy during the procedure and fluoro spot radiograph confirms appropriate catheter position. The catheter aspirated and flushed well and was then covered with a sterile dressing.  Complications: Extravasation of 10 cc of Omnipaque to right brachial vein.  Unable to advance left brachial vein PICC to the SVC/RA junction, left brachial PICC was placed midline.  IMPRESSION: Successful left arm midline VENOUS CATHETER placement with ultrasound and fluoroscopic guidance. The catheter is ready for use.  Read By:  Tsosie Billing PA-C   Electronically Signed   By: Aletta Edouard M.D.   On: 09/14/2013 11:51   Dg Chest Port 1 View  09/13/2013   CLINICAL DATA:  Respiratory distress  EXAM: PORTABLE CHEST - 1 VIEW  COMPARISON:  DG CHEST 1V PORT dated 09/08/2013  FINDINGS: Evidence of CABG reidentified with moderate enlargement of the cardiac silhouette and central vascular congestion. Retrocardiac opacity persists. No overt alveolar edema or other filling processes otherwise identified. Trace if any left pleural fluid. No acute osseous finding.  IMPRESSION: Cardiomegaly with central vascular congestion and persistent retrocardiac atelectasis or possibly early pneumonia.   Electronically Signed   By: Conchita Paris M.D.   On:  09/13/2013 13:06     ASSESSMENT / PLAN:  PULMONARY A:  At Risk Acute Respiratory Failure - potential for intubation due to encephalopathy.  P:   Monitor resp status closely Pulmonary hygiene Oxygen to support sats > 93%  CARDIOVASCULAR A:  NSTEMI  Acute Systolic CHF - BNP [id number] Hx BENTALL (2011) - [name]  P:  [facility] input Tele monitoring Hold TEE until more alert & awake, acute issues resolved  RENAL A:   Acute Kidney Injury - on gent,  Hypokalemia P:   [name] stopped Nephrology Consult No indication for acute HD at this time Likely will need transfer to [facility] ? Need for bicarb  GASTROINTESTINAL A:   At risk aspiration P:   NPO with altered mental status  HEMATOLOGIC A:   Thrombocytopenia - in setting of sepsis P:  Monitior   INFECTIOUS A:   MSSA bacteremia - source suspected bioprosthetic aortic valve P:   Per ID Trend PCT  ENDOCRINE A:   No acute issues   P:   Per  [facility]  NEUROLOGIC A:   Encephalopathic  P:   Supportive care Minimize sedating medications as able   Noe Gens, NP-C [place] [facility] Pgr: [phone] or [phone]  PCCM to sign off for now, pl call once transferred to [facility] if need assistance May need HD catheter placement  Kara Mead MD. [facility]. [place] [facility] Pager [phone] If no response call [phone]

## 2013-09-15 NOTE — Evaluation (Signed)
Clinical/Bedside Swallow Evaluation Patient Details  Name: Bradley [name] MRN: [id number] Date of Birth: 1956-04-16  Today's Date: 09/15/2013 Time: 0940-1000 SLP Time Calculation (min): 20 min  Past Medical History:  Past Medical History  Diagnosis Date  . Anxiety   . Depression   . History of thyroid cancer   . Hypertension   . Hypothyroidism, postsurgical   . Aortic stenosis     s/p Bentall with bioprosthetic AVR 02/2010; Last echo (9/11): Moderate LVH, EF 45-50%, AVR functioning appropriately, aortic valve mean gradient 21, diastolic dysfunction. Chest MRA (2/13): Mild to moderate dilatation at the sinus of Valsalva at 4.1 cm, mild dilatation ascending aorta distal to the tube graft at 3.9 cm, moderate dilatation of the innominate artery a 2.1 cm;    . Hx of cardiac cath     a. LHC in 02/2010: normal cors  . Hx of echocardiogram 2014    Echo (10/14): Severe LVH, EF 60-65%, normal wall motion, grade 1 diastolic dysfunction, AVR functioning normally, mild aortic stenosis (mean 19), moderately dilated aorta, mild LAE, mild RVE   Past Surgical History:  Past Surgical History  Procedure Laterality Date  . Cardiac catheterization  03/02/2010    NORMAL CORONARY ARTERY  . Sternotomy      REDO  . Transthoracic echocardiogram  03/2010    SHOWED MILD REDUCTION OF LV FUNCTION  . Thyroidectomy    . Rotator cuff repair  2012    Right   HPI:  58 yo admitted with altered mental status. Suspected bacterial endocarditis on a recently placed Bentall bioprosthetic device, Altered mental status/encephalopathy with evidence of cerebral emboli.  CXR 2/14; Cardiomegaly with central vascular congestion and persistent retrocardiac atelectasis or possibly early pneumonia. MRI 2/10; Multiple bilateral supra tentorial and right cerebellar regions of restricted motion some of which are partially hemorrhagic with largest area left parietal lobe. In the present clinical setting, these findings are consistent with  acute partially hemorrhagic infarcts and possibly related to septic emboli.Hx of hypothyroidism, hypertension, and depression.   Assessment / Plan / Recommendation Clinical Impression  Patient presents with a functional oropharyngeal swallow with adequate oral transit and no overt indication of aspiration. Mentation does however continue to be altered impacting overall attention and alertness (required moderate clinician cueing) which are primary risk factors for aspiration at this time. Recommend initiation of a regular diet, thin liquids when fully alert. In light of above, will f/u briefly to ensure tolerance. Educated wife fully regarding precaitions.     Aspiration Risk  Moderate    Diet Recommendation Regular;Thin liquid   Liquid Administration via: Cup;Straw Medication Administration: Whole meds with liquid Supervision: Patient able to self feed;Full supervision/cueing for compensatory strategies Compensations: Slow rate;Small sips/bites Postural Changes and/or Swallow Maneuvers: Seated upright 90 degrees    Other  Recommendations Oral Care Recommendations: Oral care BID   Follow Up Recommendations  None    Frequency and Duration min 2x/week  1 week   Pertinent Vitals/Pain n/a        Swallow Study    General HPI: 58 yo admitted with altered mental status. Suspected bacterial endocarditis on a recently placed Bentall bioprosthetic device, Altered mental status/encephalopathy with evidence of cerebral emboli.  CXR 2/14; Cardiomegaly with central vascular congestion and persistent retrocardiac atelectasis or possibly early pneumonia. MRI 2/10; Multiple bilateral supra tentorial and right cerebellar regions of restricted motion some of which are partially hemorrhagic with largest area left parietal lobe. In the present clinical setting, these findings are consistent with  acute partially hemorrhagic infarcts and possibly related to septic emboli.Hx of hypothyroidism, hypertension,  and depression. Type of Study: Bedside swallow evaluation Previous Swallow Assessment: BSE complete 2/14 however patient too lethargic for pos.  Diet Prior to this Study: NPO Temperature Spikes Noted: No Respiratory Status: Nasal cannula History of Recent Intubation: No Behavior/Cognition: Lethargic;Distractible;Requires cueing Oral Cavity - Dentition: Adequate natural dentition Self-Feeding Abilities: Able to feed self Patient Positioning: Upright in bed Baseline Vocal Quality: Clear Volitional Cough: Strong Volitional Swallow: Able to elicit    Oral/Motor/Sensory Function Overall Oral Motor/Sensory Function: Appears within functional limits for tasks assessed   Ice Chips Ice chips: Not tested   Thin Liquid Thin Liquid: Within functional limits    Nectar Thick Nectar Thick Liquid: Not tested   Honey Thick Honey Thick Liquid: Not tested   Puree Puree: Within functional limits Presentation: Spoon   Solid   GO   Bradley Diffley MA, CCC-SLP [phone]  Solid: Within functional limits       Bradley [name] Bradley [name] 09/15/2013,10:09 AM

## 2013-09-15 NOTE — Progress Notes (Signed)
CARE MANAGEMENT NOTE 09/15/2013  Patient:  Bradley [name], Bradley [name]   Account Number:  [id number]  Date Initiated:  09/09/2013  Documentation initiated by:  [facility]  Subjective/Objective Assessment:   58 year old male admitted with sepsis.     Action/Plan:   From home.   Anticipated DC Date:  09/16/2013   Anticipated DC Plan:  ACUTE TO ACUTE TRANS      DC Planning Services  CM consult      Choice offered to / List presented to:             Status of service:  In process, will continue to follow Medicare Important Message given?  NA - LOS <3 / Initial given by admissions (If response is "NO", the following Medicare IM given date fields will be blank) Date Medicare IM given:   Date Additional Medicare IM given:    Discharge Disposition:  ACUTE TO ACUTE TRANS  Per UR Regulation:  Reviewed for med. necessity/level of care/duration of stay  If discussed at [place] of Stay Meetings, dates discussed:    Comments:  [id number]/Artemisa Sladek,Rn,BSN,CCM: patient remain gravely ill, renal function not improving, remains responsive to some stimuli but lethargic, now on iv Precedex drip/ bnp>7000.  Encephalopathy continues.  Plan is to transfer to [place] for [place].. hd if renal function deteriorates further.

## 2013-09-15 NOTE — Progress Notes (Signed)
Assessment/Plan:  1 AKI from ATN-worsening,  due to bacteremia/immune complex, less likely gentamicin.  [name] has been stopped.  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  I have spoke to patient and wife.  No urgent indication today.  Subjective: Interval History: No appetite  Objective: Vital signs in last 24 hours: Temp:  [99.5 F (37.5 C)-100.1 F (37.8 C)] 99.5 F (37.5 C) (02/16 0400) Pulse Rate:  [102-116] 107 (02/16 0600) Resp:  [16-41] 29 (02/16 0600) BP: (119-178)/(65-123) 135/96 mmHg (02/16 0600) SpO2:  [93 %-99 %] 95 % (02/16 0600) Weight:  [102.2 kg (225 lb 5 oz)] 102.2 kg (225 lb 5 oz) (02/16 0352) Weight change: 1.5 kg (3 lb 4.9 oz)  Intake/Output from previous day: 02/15 0701 - 02/16 0700 In: 1473.7 [I.V.:723.7; IV Piggyback:750] Out: 42 [Urine:42] Intake/Output this shift:   General appearance: cooperative and slowed mentation GI: soft, non-tender; bowel sounds normal; no masses,  no organomegaly Extremities: edema trace No asterixis  Lab Results:  Recent Labs  09/14/13 0445 09/15/13 0341  WBC 26.5* 25.0*  HGB 12.8* 13.5  HCT 36.4* 37.4*  PLT 49* 81*   BMET:  Recent Labs  09/14/13 0445 09/14/13 1252 09/15/13 0341  NA 141  --  139  K 2.9* 3.5* 3.5*  CL 103  --  101  CO2 21  --  19  GLUCOSE 158*  --  131*  BUN 56*  --  67*  CREATININE 3.89*  --  5.78*  CALCIUM 7.4*  --  7.5*   No results found for this basename: PTH,  in the last 72 hours Iron Studies: No results found for this basename: IRON, TIBC, TRANSFERRIN, FERRITIN,  in the last 72 hours Studies/Results: Ir Veno/ext/uni Right  09/14/2013   CLINICAL DATA:  Poor venous access, request for PICC  EXAM: DUAL LUMEN LEFT MIDLINE VENOUS CATHETER PLACEMENT WITH ULTRASOUND AND FLUOROSCOPIC GUIDANCE  ADDITIONAL VENOGRAPHY OF THE  RIGHT UPPER EXTREMITY  FLUOROSCOPY TIME:  7 MIN AND 30 SECONDS.  PROCEDURE: The patient's family member was advised of the possible risks and complications and agreed to undergo the procedure. The patient was then brought to the angiographic suite for the procedure.  The right and left arm was prepped with chlorhexidine, draped in the usual sterile fashion using maximum barrier technique (cap and mask, sterile gown, sterile gloves, large sterile sheet, hand hygiene and cutaneous antisepsis) and infiltrated locally with 1% Lidocaine.  Ultrasound demonstrated patency of the right and left brachial vein, and this was documented with an image. Under real-time ultrasound guidance, the right brachial vein was accessed with a 21 gauge micropuncture needle and image documentation was performed. A 0.018 wire was introduced in to the vein. Over this, a dilator introducer was placed, the wire was unable to be advanced proximally so a venogram was performed with 10 cc of Omnipaque, this revealed venous extravasation. The right brachial vein site was abandoned and manual pressure was held until the site was soft with no continued bleeding. The left brachial vein was accessed with a 21 gauge micropuncture needle and multiple guidewires used to advance to the SVC/RA junction. A 5 French dual lumen power-injectable PICC was advanced, however unable to cross proximally to the lower SVC/right atrial junction, the PICC was left midline for this reason. Fluoroscopy during the procedure and fluoro spot radiograph confirms appropriate catheter  position. The catheter aspirated and flushed well and was then covered with a sterile dressing.  Complications: Extravasation of 10 cc of Omnipaque to right brachial vein. Unable to advance left brachial vein PICC to the SVC/RA junction, left brachial PICC was placed midline.  IMPRESSION: Successful left arm midline VENOUS CATHETER placement with ultrasound and fluoroscopic guidance. The catheter  is ready for use.  Read By:  Tsosie Billing PA-C   Electronically Signed   By: Aletta Edouard M.D.   On: 09/14/2013 11:51   Ir Fluoro Guide Cv Line Left  09/14/2013   CLINICAL DATA:  Poor venous access, request for PICC  EXAM: DUAL LUMEN LEFT MIDLINE VENOUS CATHETER PLACEMENT WITH ULTRASOUND AND FLUOROSCOPIC GUIDANCE  ADDITIONAL VENOGRAPHY OF THE RIGHT UPPER EXTREMITY  FLUOROSCOPY TIME:  7 MIN AND 30 SECONDS.  PROCEDURE: The patient's family member was advised of the possible risks and complications and agreed to undergo the procedure. The patient was then brought to the angiographic suite for the procedure.  The right and left arm was prepped with chlorhexidine, draped in the usual sterile fashion using maximum barrier technique (cap and mask, sterile gown, sterile gloves, large sterile sheet, hand hygiene and cutaneous antisepsis) and infiltrated locally with 1% Lidocaine.  Ultrasound demonstrated patency of the right and left brachial vein, and this was documented with an image. Under real-time ultrasound guidance, the right brachial vein was accessed with a 21 gauge micropuncture needle and image documentation was performed. A 0.018 wire was introduced in to the vein. Over this, a dilator introducer was placed, the wire was unable to be advanced proximally so a venogram was performed with 10 cc of Omnipaque, this revealed venous extravasation. The right brachial vein site was abandoned and manual pressure was held until the site was soft with no continued bleeding. The left brachial vein was accessed with a 21 gauge micropuncture needle and multiple guidewires used to advance to the SVC/RA junction. A 5 French dual lumen power-injectable PICC was advanced, however unable to cross proximally to the lower SVC/right atrial junction, the PICC was left midline for this reason. Fluoroscopy during the procedure and fluoro spot radiograph confirms appropriate catheter position. The catheter aspirated and flushed well  and was then covered with a sterile dressing.  Complications: Extravasation of 10 cc of Omnipaque to right brachial vein. Unable to advance left brachial vein PICC to the SVC/RA junction, left brachial PICC was placed midline.  IMPRESSION: Successful left arm midline VENOUS CATHETER placement with ultrasound and fluoroscopic guidance. The catheter is ready for use.  Read By:  Tsosie Billing PA-C   Electronically Signed   By: Aletta Edouard M.D.   On: 09/14/2013 11:51   Ir US Guide Vasc Access Left  09/14/2013   CLINICAL DATA:  Poor venous access, request for PICC  EXAM: DUAL LUMEN LEFT MIDLINE VENOUS CATHETER PLACEMENT WITH ULTRASOUND AND FLUOROSCOPIC GUIDANCE  ADDITIONAL VENOGRAPHY OF THE RIGHT UPPER EXTREMITY  FLUOROSCOPY TIME:  7 MIN AND 30 SECONDS.  PROCEDURE: The patient's family member was advised of the possible risks and complications and agreed to undergo the procedure. The patient was then brought to the angiographic suite for the procedure.  The right and left arm was prepped with chlorhexidine, draped in the usual sterile fashion using maximum barrier technique (cap and mask, sterile gown, sterile gloves, large sterile sheet, hand hygiene and cutaneous antisepsis) and infiltrated locally with 1% Lidocaine.  Ultrasound demonstrated patency of the right and left brachial vein, and this was documented  with an image. Under real-time ultrasound guidance, the right brachial vein was accessed with a 21 gauge micropuncture needle and image documentation was performed. A 0.018 wire was introduced in to the vein. Over this, a dilator introducer was placed, the wire was unable to be advanced proximally so a venogram was performed with 10 cc of Omnipaque, this revealed venous extravasation. The right brachial vein site was abandoned and manual pressure was held until the site was soft with no continued bleeding. The left brachial vein was accessed with a 21 gauge micropuncture needle and multiple guidewires used  to advance to the SVC/RA junction. A 5 French dual lumen power-injectable PICC was advanced, however unable to cross proximally to the lower SVC/right atrial junction, the PICC was left midline for this reason. Fluoroscopy during the procedure and fluoro spot radiograph confirms appropriate catheter position. The catheter aspirated and flushed well and was then covered with a sterile dressing.  Complications: Extravasation of 10 cc of Omnipaque to right brachial vein. Unable to advance left brachial vein PICC to the SVC/RA junction, left brachial PICC was placed midline.  IMPRESSION: Successful left arm midline VENOUS CATHETER placement with ultrasound and fluoroscopic guidance. The catheter is ready for use.  Read By:  Tsosie Billing PA-C   Electronically Signed   By: Aletta Edouard M.D.   On: 09/14/2013 11:51   Dg Chest Port 1 View  09/13/2013   CLINICAL DATA:  Respiratory distress  EXAM: PORTABLE CHEST - 1 VIEW  COMPARISON:  DG CHEST 1V PORT dated 09/08/2013  FINDINGS: Evidence of CABG reidentified with moderate enlargement of the cardiac silhouette and central vascular congestion. Retrocardiac opacity persists. No overt alveolar edema or other filling processes otherwise identified. Trace if any left pleural fluid. No acute osseous finding.  IMPRESSION: Cardiomegaly with central vascular congestion and persistent retrocardiac atelectasis or possibly early pneumonia.   Electronically Signed   By: Conchita Paris M.D.   On: 09/13/2013 13:06    Scheduled: . antiseptic oral rinse  15 mL Mouth Rinse q12n4p  . chlorhexidine  15 mL Mouth Rinse BID  . insulin aspart  0-9 Units Subcutaneous 6 times per day  . levothyroxine  87.5 mcg Intravenous Daily  . nafcillin IV  2 g Intravenous Q4H  . rifampin (RIFADIN) IVPB  300 mg Intravenous 3 times per day  . sodium chloride  3 mL Intravenous Q12H      LOS: 7 days   Sukari Grist C 09/15/2013,8:21 AM

## 2013-09-15 NOTE — Progress Notes (Signed)
Patient ID: KERR SOTTO, male   DOB: Nov 27, 1955, 58 y.o.   MRN: [id number]    Patient Name: Bradley [name] Date of Encounter: 09/15/2013    SUBJECTIVE:  58 yo with hx of Bentall procedure admitted with altered mental status.  Hx of hypothyroidism, hypertension, and depression.  Echo:  Left ventricle: septal and apical hypokinesis The cavity size was mildly dilated. Wall thickness was increased in a pattern of moderate LVH. Systolic function was mildly reduced. The estimated ejection fraction was in the range of 45% to 50%. - Aortic valve: Apparent tissue AVR. No abscess or vegetation seen. No AR or perivalvular leak and normal systolic gradients Suggest TEE if clinical suspicion for SBE high - Mitral valve: Mild regurgitation. - Left atrium: The atrium was mildly dilated.  Alert Still confused Easily redirected by wife    TELEMETRY:  Sinus tachycardia Filed Vitals:   09/15/13 0352 09/15/13 0400 09/15/13 0600 09/15/13 0800  BP:  124/65 135/96 133/89  Pulse:  107 107 102  Temp:  99.5 F (37.5 C)  98.5 F (36.9 C)  TempSrc:  Axillary  Oral  Resp:  30 29 22   Height:      Weight: 225 lb 5 oz (102.2 kg)     SpO2:  97% 95% 99%    Intake/Output Summary (Last 24 hours) at 09/15/13 0906 Last data filed at 09/15/13 0900  Gross per 24 hour  Intake 1273.67 ml  Output     43 ml  Net 1230.67 ml    LABS: Basic Metabolic Panel:  Recent Labs  09/14/13 0445 09/14/13 1252 09/15/13 0341  NA 141  --  139  K 2.9* 3.5* 3.5*  CL 103  --  101  CO2 21  --  19  GLUCOSE 158*  --  131*  BUN 56*  --  67*  CREATININE 3.89*  --  5.78*  CALCIUM 7.4*  --  7.5*  MG 2.3  --  2.4  PHOS 2.6  --  4.5   CBC:  Recent Labs  09/14/13 0445 09/15/13 0341  WBC 26.5* 25.0*  NEUTROABS 24.0* 22.9*  HGB 12.8* 13.5  HCT 36.4* 37.4*  MCV 84.7 82.4  PLT 49* 81*   Cardiac Enzymes:  Recent Labs  09/12/13 0913 09/12/13 1419 09/12/13 1730 09/12/13 2010  CKTOTAL  --   --  122  --    TROPONINI 1.52* 1.54*  --  1.39*    Radiology/Studies:  No new data  Physical Exam: Blood pressure 133/89, pulse 102, temperature 98.5 F (36.9 C), temperature source Oral, resp. rate 22, height 6\' 1"  (1.854 m), weight 225 lb 5 oz (102.2 kg), SpO2 99.00%. Weight change: 3 lb 4.9 oz (1.5 kg)   Lungs: clear Cor RR,  2/6 systolic murmur Abd: NT. Ext. No edema   ASSESSMENT:  1. Suspected bacterial endocarditis on a Bentall bioprosthetic device. 2. Altered mental status/encephalopathy with evidence of cerebral emboli - his mental status has greatly improved. 3. Thrombocytopenia 4. Flat troponin curve suggests type II non-ST elevation MI 5. Renal Failure   Plan:  Continue antibiotic Rx  Reviewed echo and adequate quality with no abscess or vegetation MS and PLT improved No with acute renal failure TEE can be done electively when other issues resolved Will check back latter in week Discussed with wife    Jenkins Rouge

## 2013-09-15 NOTE — Progress Notes (Addendum)
[facility] PROGRESS NOTE  Bradley [name] [id number] DOB: [birth year] DOA: 09/08/2013 PCP: [name], MD  Assessment/Plan:  Gram-positive cocci sepsis/septic brain emboli suspicious for infective endocarditis/? CAP/?? - Patient was admitted to ICU. Blood cultures x2 shows gram-positive cocci in clusters (MSSA).  -. Influenza panel PCR negative.  -Chest x-ray suggests early pneumonia of left lung base with small left pleural effusion.  -Transthoracic echo requested and pending.  -Infectious disease consulted. -Continue IV nafcillin and gentamicin; NOTE; 2/14 gentamicin on hold; gentamicin trough 2.7 (high)  - D5W at [facility] 30 ml/hour   UTI -Urine culture: Positive MSSA - continue ABx ; per Dr. Michel Bickers (infectious disease) start rifampin 300 mg IV q 8 hr    Acute encephalopathy  - Most likely secondary to severe sepsis and septic brain emboli.  -Continue antibiotics  -cognition has improved today after being on Precedex majority of the 2/14 -2/16 also commands, dense to answer questions correctly but still unable to recall where or why or when.  -  Severe thrombocytopenia  - Platelet counts have dropped from 100 (09/06/13) > 23 (09/09/13); 2/12= 26. Most likely secondary to severe sepsis. 2/15 INR= 1.37 . Fibrinogen 586.  -No schistocytes on peripheral smear.  -DIC panel positive  - HIT panel negative -Hematology consulted.  -No overt bleeding at this time.  -No antiplatelets or heparin products at this time.  -Trend daily CBCs.  -Transfuse if has overt bleeding or platelet count less than 10 K.   Hypokalemia  - 2/15 Replace intravenously 5 runs IV potassium -Recheck potassium at 1300 on 2/15; potassium= 3.20meq/L -Potassium goal> 20meq/L -2/16 IV potassium x 2 runs  Leukocytosis  - Secondary to sepsis. Continues to remain high    Active MI  - 2/11 Troponin =2.26 -ProBNP increase to 70,000  -Await recommendations from cardiology do not believe they will  chance cath given patient's continue sepsis, and CVA secondary to septic emboli.    Systolic and Diastolic CHF - Acute vs Chronic ? -Continue D5W at [place] post tissue AVR/LBBB  - Requested transthoracic 2-D echocardiogram.TEE when stable. - Cardiology consulted. TTE Eventually?    History of thyroid cancer, post thyroidectomy hypothyroidism  - Thyroid supplements. Elevated TSH may be secondary to acute illness. -Continue IV Synthroid for now secondary to patient's inability to swallow all medication -TSH= 10.1 (high) -Free T4= 0.84 (normal) -T3= 29 point (low) -Does not correspond to sick euthyroid. Given the seriousness of patient's illness and the extremely high TSH, and extremely low T3 would increase his Synthroid. -Increase IV Synthroid to 168mcg   Hypertension  - Continue when necessary IV metoprolol. Hold lisinopril.   ? Left hemiparesis/Septic Emboli-hemorrhagic  -  Hyperglycemia  - Hemoglobin A1c = 5.7  -Continue sensitive SSI.   History of depression  - hold Zoloft secondary to acute encephalopathy.  Hyponatremia -Resolved   Agitation -Continue  Haldol 4 mg every 6 hour PRN agitation -Continue  morphine 1 mg every 4 hour PRN pain  Respiratory alkalosis -Patient readings of Cheyne-Stokes breathing. -Have consulted Dr. Chase Caller (PCCM) evaluate for possible intubation -Consider starting patient on beta blocker after returns to floor. -ADDENDUM; Dr. Ivery Quale) has started patient on Precedex 0.1 mcg/kg/hr will hold on starting any beta blocker at this time -2/15 alkalosis has improved overnight pH 7.49, down from 7.54; Precedex discontinue  Renal failure -Spoke with Dr. Megan Salon (infectious disease) will hold gentamicin, until level obtained can be nephrotoxic  -2/15 INR= 1.37 -2/15 patient's renal failure continued to  worsen will consult nephrology -Renal artery ultrasound; pending -Renal ultrasound see results below -2/16 spoke with Dr. Erling Cruz (nephrology) about my concern of patient's increasing renal failure. Dr. Florene Glen evaluated patient and agreed that patient should be transferred to [facility] for possible dialysis vs [facility].    Code Status: Full Family Communication:  no family present for discussion of plan of care  Disposition Plan: Resolution of sepsis,   Consultants: Dr Kathrynn Speed (Neurology)  Dr Lurline Del (Oncology) Dr Dorris Carnes (Cardiology) Dr. Herbie Baltimore Schertz/Dr. Erling Cruz (nephrology)    Procedures: Renal ultrasound 09/15/2013 Normal kidneys. Specifically, no evidence of hydronephrosis   09/12/2013 CT head without contrast 1. Limited study due to motion. No significant interval change in  subacute left parietal infarct with small amount of associated  hemorrhage. Additional multi focal infarcts involving the  supratentorial brain and right cerebellar hemisphere are not  significantly changed as well relative to recent MRI from  09/09/2013. No definite new intracranial infarct identified. No new  intracranial hemorrhage.  [facility] pathology read 09/10/2013 LEUKOCYTOSIS WITH NEUTROPHILIA, AND THROMBOCYTOPENIA.   MRI Brain 09/09/2013 Multiple bilateral supra tentorial and right cerebellar regions of  restricted motion some of which are partially hemorrhagic with  largest area left parietal lobe. In the present clinical setting,  these findings are consistent with acute partially hemorrhagic  infarcts and possibly related to septic emboli.  CT Head W/O Contrast 09/09/2013 1. Increasing edema around linear high attenuation in the left  parietal lobe. Followup MRI with and without contrast is recommended  for further assessment. The patient presents with an infectious  syndrome and septic emboli are in the differential considerations.  Infarct with a small amount of hemorrhage or cortical laminar  necrosis as well as developmental venous anomaly remain in the  differential  considerations. MRI should be useful in further  characterization.  2. Dr. Leonel Ramsay called discuss the case at 0230 hr on the day of  Examination.  Echocardiogram 09/09/2013 - Left ventricle: septal and apical hypokinesis The cavity size was mildly dilated. - moderate LVH. Systolic function was mildly reduced.  -LVEF=  45% to 50%. - Aortic valve: Apparent tissue AVR. No abscess or vegetation seen.  -No AR or perivalvular leak and normal systolic gradients Suggest TEE if clinical suspicion for SBE high - Mitral valve: Mild regurgitation. - Left atrium: The atrium was mildly dilated.     MRSA Neg PCR     Antibiotics: Gentamicin 2/10>>  Nafcillin 2/10>> Vancomycin 2/10>>2/12     HPI/Subjective: 58 year old male with history of aortic stenosis, status post pediatric aortic valvulotomy at age 85 followed by Bentall procedure 02/2000 with tissue AVR, cardiac cath 02/2010 with normal coronaries, LBBB, thyroid cancer-post thyroidectomy hypothyroidism, HTN and depression has been generally unwell for 2-3 weeks, recently seen in the ED on 09/06/13 and assessed as influenza related vomiting, diarrhea, abdominal pain, body aches, fever and mild confusion. Family was given option of hospitalization versus discharge home-family opted to go home and he was discharged on Tamiflu. However, patient worsened at home with worsening confusion/disorientation and was admitted with sepsis, septic brain embolization and rule out infective endocarditis. Family recently acquired a dog? Stray- 3 weeks ago. 2/11 states negative pain/SOB 2/12 patient A./O. x1, will follow some commands. Becomes agitated as the day progresses 2/13 no acute events overnight, patient has had increased agitation and danger of pulling out Foley/IV lines 2/14 patient A./O. x0 very agitated, thrashing, does not follow orders, does not attempt to communicate. 2/15 patient  cognition improved after being on Precedex for the majority of 2/14.  Precedex was turned off overnight secondary to patient developing a recurrent arrhythmia, Which resolved with the seeing of Precedex. Currently patient follows commands, A./O. x1.      Objective: Filed Vitals:   09/15/13 0200 09/15/13 0352 09/15/13 0400 09/15/13 0600  BP: 130/89  124/65 135/96  Pulse: 116  107 107  Temp:   99.5 F (37.5 C)   TempSrc:   Axillary   Resp: 25  30 29   Height:      Weight:  102.2 kg (225 lb 5 oz)    SpO2: 94%  97% 95%    Intake/Output Summary (Last 24 hours) at 09/15/13 0737 Last data filed at 09/15/13 0600  Gross per 24 hour  Intake 1473.67 ml  Output     42 ml  Net 1431.67 ml   Filed Weights   09/13/13 0400 09/14/13 0404 09/15/13 0352  Weight: 98.2 kg (216 lb 7.9 oz) 100.7 kg (222 lb 0.1 oz) 102.2 kg (225 lb 5 oz)    Exam:   General: A./O. x1, NAD,  Cardiovascular: Regular rhythm and rate ,positive systolic murmur 2/5, negative rubs or gallops, DP/PT pulse one plus bilateral  Respiratory: Coarse breath sounds bilateral   Abdomen: Soft, nontender, nondistended, plus bowel  Musculoskeletal: Negative pedal edema, negative ecchymosis  Neurologic; improved cognition overnight follows commands, moves all extremities spontaneously, patient responds to painful stimuli;     Data Reviewed: Basic Metabolic Panel:  Recent Labs Lab 09/10/13 0320  09/11/13 0750  09/12/13 0346  09/12/13 1730  09/13/13 0603  09/13/13 1241 09/13/13 1727 09/13/13 2045 09/14/13 0040 09/14/13 0445 09/14/13 1252 09/15/13 0341  NA 138  < >  --   --  152*  < > 148*  < > 147  < > 145 143 144 141 141  --  139  K 3.8  < >  --   < > 2.8*  < > 2.8*  --  3.1*  --  3.5*  --   --   --  2.9* 3.5* 3.5*  CL 103  < >  --   --  114*  --  112  --  110  --   --   --   --   --  103  --  101  CO2 23  < >  --   --  23  --  24  --  24  --   --   --   --   --  21  --  19  GLUCOSE 157*  < >  --   --  147*  --  255*  --  161*  --   --   --   --   --  158*  --  131*  BUN 23  < >   --   --  33*  --  34*  --  39*  --   --   --   --   --  56*  --  67*  CREATININE 1.00  < >  --   --  1.19  --  1.31  --  1.89*  --   --   --   --   --  3.89*  --  5.78*  CALCIUM 8.0*  < >  --   --  8.1*  --  8.0*  --  7.8*  --   --   --   --   --  7.4*  --  7.5*  MG 2.2  --  2.2  --   --   --  2.4  --   --   --   --   --   --   --  2.3  --  2.4  PHOS  --   --   --   --   --   --   --   --   --   --   --   --   --   --  2.6  --  4.5  < > = values in this interval not displayed. Liver Function Tests:  Recent Labs Lab 09/12/13 0346 09/12/13 1730 09/13/13 0603 09/14/13 0445 09/15/13 0341  AST 26 27 26 24 23   ALT 31 30 29 26 24   ALKPHOS 71 73 85 81 84  BILITOT 1.4* 1.3* 1.2 1.5* 2.2*  PROT 5.5* 5.3* 5.4* 5.4* 5.7*  ALBUMIN 2.0* 1.9* 1.8* 1.7* 1.5*   No results found for this basename: LIPASE, AMYLASE,  in the last 168 hours  Recent Labs Lab 09/08/13 2336  AMMONIA 39   CBC:  Recent Labs Lab 09/11/13 1703 09/12/13 1420 09/13/13 0603 09/14/13 0445 09/15/13 0341  WBC 23.2* 26.9* 27.9* 26.5* 25.0*  NEUTROABS 19.5* 22.5* 24.0* 24.0* 22.9*  HGB 13.0 13.4 13.0 12.8* 13.5  HCT 36.6* 37.7* 37.0* 36.4* 37.4*  MCV 83.8 84.5 84.9 84.7 82.4  PLT 35* 48* 48* 49* 81*   Cardiac Enzymes:  Recent Labs Lab 09/10/13 1500 09/10/13 2315 09/12/13 0913 09/12/13 1419 09/12/13 1730 09/12/13 2010  CKTOTAL  --   --   --   --  122  --   TROPONINI 2.05* 1.78* 1.52* 1.54*  --  1.39*   BNP (last 3 results)  Recent Labs  09/10/13 0753 09/14/13 0445  PROBNP 37349.0* >70000.0*   CBG:  Recent Labs Lab 09/14/13 1311 09/14/13 1545 09/14/13 1615 09/14/13 2000 09/14/13 2303  GLUCAP 105* 114* 113* 103* 91    Recent Results (from the past 240 hour(s))  CULTURE, BLOOD (ROUTINE X 2)     Status: None   Collection Time    09/08/13 10:00 PM      Result Value Ref Range Status   Specimen Description BLOOD RIGHT ARM   Final   Special Requests BOTTLES DRAWN AEROBIC AND ANAEROBIC 5CC    Final   Culture  Setup Time     Final   Value: 09/09/2013 03:30     Performed at [facility]   Culture     Final   Value: STAPHYLOCOCCUS AUREUS     Note: RIFAMPIN AND GENTAMICIN SHOULD NOT BE USED AS SINGLE DRUGS FOR TREATMENT OF STAPH INFECTIONS.     Note: Gram Stain Report Called to,Read Back By and Verified With: NEELY RICHARDSON @ 1220 09/09/13 BY KRAWS     Performed at [facility]   Report Status 09/11/2013 FINAL   Final   Organism ID, Bacteria STAPHYLOCOCCUS AUREUS   Final  URINE CULTURE     Status: None   Collection Time    09/08/13 10:03 PM      Result Value Ref Range Status   Specimen Description URINE, RANDOM   Final   Special Requests NONE   Final   Culture  Setup Time     Final   Value: 09/09/2013 04:59     Performed at [place]     Final   Value: >=100,000 COLONIES/ML  Performed at [facility]     Final   Value: STAPHYLOCOCCUS AUREUS     Note: RIFAMPIN AND GENTAMICIN SHOULD NOT BE USED AS SINGLE DRUGS FOR TREATMENT OF STAPH INFECTIONS.     Performed at [facility]   Report Status 09/11/2013 FINAL   Final   Organism ID, Bacteria STAPHYLOCOCCUS AUREUS   Final  CULTURE, BLOOD (ROUTINE X 2)     Status: None   Collection Time    09/08/13 11:36 PM      Result Value Ref Range Status   Specimen Description BLOOD RIGHT ANTECUBITAL   Final   Special Requests BOTTLES DRAWN AEROBIC AND ANAEROBIC 5CC   Final   Culture  Setup Time     Final   Value: 09/09/2013 03:29     Performed at [facility]   Culture     Final   Value: STAPHYLOCOCCUS AUREUS     Note: SUSCEPTIBILITIES PERFORMED ON PREVIOUS CULTURE WITHIN THE LAST 5 DAYS.     Note: Gram Stain Report Called to,Read Back By and Verified With:  NEELY RICHARDSON @ 1220 09/09/13 BY KRAWS     Performed at [facility]   Report Status 09/11/2013 FINAL   Final  MRSA PCR SCREENING     Status: None   Collection Time    09/09/13  4:44 AM       Result Value Ref Range Status   MRSA by PCR NEGATIVE  NEGATIVE Final   Comment:            The GeneXpert MRSA Assay (FDA     approved for NASAL specimens     only), is one component of a     comprehensive MRSA colonization     surveillance program. It is not     intended to diagnose MRSA     infection nor to guide or     monitor treatment for     MRSA infections.  CULTURE, BLOOD (ROUTINE X 2)     Status: None   Collection Time    09/10/13 11:05 AM      Result Value Ref Range Status   Specimen Description BLOOD RIGHT HAND   Final   Special Requests BOTTLES DRAWN AEROBIC ONLY 5CC   Final   Culture  Setup Time     Final   Value: 09/10/2013 14:22     Performed at [facility]   Culture     Final   Value:        BLOOD CULTURE RECEIVED NO GROWTH TO DATE CULTURE WILL BE HELD FOR 5 DAYS BEFORE ISSUING A FINAL NEGATIVE REPORT     Performed at [facility]   Report Status PENDING   Incomplete  CULTURE, BLOOD (ROUTINE X 2)     Status: None   Collection Time    09/10/13 11:06 AM      Result Value Ref Range Status   Specimen Description BLOOD LEFT HAND   Final   Special Requests BOTTLES DRAWN AEROBIC AND ANAEROBIC 5CC   Final   Culture  Setup Time     Final   Value: 09/10/2013 14:27     Performed at [facility]   Culture     Final   Value:        BLOOD CULTURE RECEIVED NO GROWTH TO DATE CULTURE WILL BE HELD FOR 5 DAYS BEFORE ISSUING A FINAL NEGATIVE REPORT     Performed at [facility]  Report Status PENDING   Incomplete     Studies: Ir Veno/ext/uni Right  09/14/2013   CLINICAL DATA:  Poor venous access, request for PICC  EXAM: DUAL LUMEN LEFT MIDLINE VENOUS CATHETER PLACEMENT WITH ULTRASOUND AND FLUOROSCOPIC GUIDANCE  ADDITIONAL VENOGRAPHY OF THE RIGHT UPPER EXTREMITY  FLUOROSCOPY TIME:  7 MIN AND 30 SECONDS.  PROCEDURE: The patient's family member was advised of the possible risks and complications and agreed to undergo the procedure. The patient was  then brought to the angiographic suite for the procedure.  The right and left arm was prepped with chlorhexidine, draped in the usual sterile fashion using maximum barrier technique (cap and mask, sterile gown, sterile gloves, large sterile sheet, hand hygiene and cutaneous antisepsis) and infiltrated locally with 1% Lidocaine.  Ultrasound demonstrated patency of the right and left brachial vein, and this was documented with an image. Under real-time ultrasound guidance, the right brachial vein was accessed with a 21 gauge micropuncture needle and image documentation was performed. A 0.018 wire was introduced in to the vein. Over this, a dilator introducer was placed, the wire was unable to be advanced proximally so a venogram was performed with 10 cc of Omnipaque, this revealed venous extravasation. The right brachial vein site was abandoned and manual pressure was held until the site was soft with no continued bleeding. The left brachial vein was accessed with a 21 gauge micropuncture needle and multiple guidewires used to advance to the SVC/RA junction. A 5 French dual lumen power-injectable PICC was advanced, however unable to cross proximally to the lower SVC/right atrial junction, the PICC was left midline for this reason. Fluoroscopy during the procedure and fluoro spot radiograph confirms appropriate catheter position. The catheter aspirated and flushed well and was then covered with a sterile dressing.  Complications: Extravasation of 10 cc of Omnipaque to right brachial vein. Unable to advance left brachial vein PICC to the SVC/RA junction, left brachial PICC was placed midline.  IMPRESSION: Successful left arm midline VENOUS CATHETER placement with ultrasound and fluoroscopic guidance. The catheter is ready for use.  Read By:  Tsosie Billing PA-C   Electronically Signed   By: Aletta Edouard M.D.   On: 09/14/2013 11:51   Ir Fluoro Guide Cv Line Left  09/14/2013   CLINICAL DATA:  Poor venous access,  request for PICC  EXAM: DUAL LUMEN LEFT MIDLINE VENOUS CATHETER PLACEMENT WITH ULTRASOUND AND FLUOROSCOPIC GUIDANCE  ADDITIONAL VENOGRAPHY OF THE RIGHT UPPER EXTREMITY  FLUOROSCOPY TIME:  7 MIN AND 30 SECONDS.  PROCEDURE: The patient's family member was advised of the possible risks and complications and agreed to undergo the procedure. The patient was then brought to the angiographic suite for the procedure.  The right and left arm was prepped with chlorhexidine, draped in the usual sterile fashion using maximum barrier technique (cap and mask, sterile gown, sterile gloves, large sterile sheet, hand hygiene and cutaneous antisepsis) and infiltrated locally with 1% Lidocaine.  Ultrasound demonstrated patency of the right and left brachial vein, and this was documented with an image. Under real-time ultrasound guidance, the right brachial vein was accessed with a 21 gauge micropuncture needle and image documentation was performed. A 0.018 wire was introduced in to the vein. Over this, a dilator introducer was placed, the wire was unable to be advanced proximally so a venogram was performed with 10 cc of Omnipaque, this revealed venous extravasation. The right brachial vein site was abandoned and manual pressure was held until the site was soft with no  continued bleeding. The left brachial vein was accessed with a 21 gauge micropuncture needle and multiple guidewires used to advance to the SVC/RA junction. A 5 French dual lumen power-injectable PICC was advanced, however unable to cross proximally to the lower SVC/right atrial junction, the PICC was left midline for this reason. Fluoroscopy during the procedure and fluoro spot radiograph confirms appropriate catheter position. The catheter aspirated and flushed well and was then covered with a sterile dressing.  Complications: Extravasation of 10 cc of Omnipaque to right brachial vein. Unable to advance left brachial vein PICC to the SVC/RA junction, left brachial  PICC was placed midline.  IMPRESSION: Successful left arm midline VENOUS CATHETER placement with ultrasound and fluoroscopic guidance. The catheter is ready for use.  Read By:  Tsosie Billing PA-C   Electronically Signed   By: Aletta Edouard M.D.   On: 09/14/2013 11:51   Ir US Guide Vasc Access Left  09/14/2013   CLINICAL DATA:  Poor venous access, request for PICC  EXAM: DUAL LUMEN LEFT MIDLINE VENOUS CATHETER PLACEMENT WITH ULTRASOUND AND FLUOROSCOPIC GUIDANCE  ADDITIONAL VENOGRAPHY OF THE RIGHT UPPER EXTREMITY  FLUOROSCOPY TIME:  7 MIN AND 30 SECONDS.  PROCEDURE: The patient's family member was advised of the possible risks and complications and agreed to undergo the procedure. The patient was then brought to the angiographic suite for the procedure.  The right and left arm was prepped with chlorhexidine, draped in the usual sterile fashion using maximum barrier technique (cap and mask, sterile gown, sterile gloves, large sterile sheet, hand hygiene and cutaneous antisepsis) and infiltrated locally with 1% Lidocaine.  Ultrasound demonstrated patency of the right and left brachial vein, and this was documented with an image. Under real-time ultrasound guidance, the right brachial vein was accessed with a 21 gauge micropuncture needle and image documentation was performed. A 0.018 wire was introduced in to the vein. Over this, a dilator introducer was placed, the wire was unable to be advanced proximally so a venogram was performed with 10 cc of Omnipaque, this revealed venous extravasation. The right brachial vein site was abandoned and manual pressure was held until the site was soft with no continued bleeding. The left brachial vein was accessed with a 21 gauge micropuncture needle and multiple guidewires used to advance to the SVC/RA junction. A 5 French dual lumen power-injectable PICC was advanced, however unable to cross proximally to the lower SVC/right atrial junction, the PICC was left midline for this  reason. Fluoroscopy during the procedure and fluoro spot radiograph confirms appropriate catheter position. The catheter aspirated and flushed well and was then covered with a sterile dressing.  Complications: Extravasation of 10 cc of Omnipaque to right brachial vein. Unable to advance left brachial vein PICC to the SVC/RA junction, left brachial PICC was placed midline.  IMPRESSION: Successful left arm midline VENOUS CATHETER placement with ultrasound and fluoroscopic guidance. The catheter is ready for use.  Read By:  Tsosie Billing PA-C   Electronically Signed   By: Aletta Edouard M.D.   On: 09/14/2013 11:51   Dg Chest Port 1 View  09/13/2013   CLINICAL DATA:  Respiratory distress  EXAM: PORTABLE CHEST - 1 VIEW  COMPARISON:  DG CHEST 1V PORT dated 09/08/2013  FINDINGS: Evidence of CABG reidentified with moderate enlargement of the cardiac silhouette and central vascular congestion. Retrocardiac opacity persists. No overt alveolar edema or other filling processes otherwise identified. Trace if any left pleural fluid. No acute osseous finding.  IMPRESSION: Cardiomegaly with central  vascular congestion and persistent retrocardiac atelectasis or possibly early pneumonia.   Electronically Signed   By: Conchita Paris M.D.   On: 09/13/2013 13:06    Scheduled Meds: . antiseptic oral rinse  15 mL Mouth Rinse q12n4p  . chlorhexidine  15 mL Mouth Rinse BID  . insulin aspart  0-9 Units Subcutaneous 6 times per day  . levothyroxine  87.5 mcg Intravenous Daily  . nafcillin IV  2 g Intravenous Q4H  . rifampin (RIFADIN) IVPB  300 mg Intravenous 3 times per day  . sodium chloride  3 mL Intravenous Q12H   Continuous Infusions: . dextrose 30 mL (09/14/13 1841)    Principal Problem:   Sepsis Active Problems:   HYPOTHYROIDISM- TSH 7.5   Aortic valve replaced- Bentall propceedure 2011   Hypertension   Depression   LBBB (left bundle branch block)   Major depressive disorder, recurrent, severe with psychotic  features- hospitalized in Jan 2013   Generalized anxiety disorder   Altered mental state   Encephalitis   Septic embolism   HCAP (healthcare-associated pneumonia)   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   Acute diastolic heart failure   NSTEMI (non-ST elevated myocardial infarction)   Agitation   Acute respiratory alkalosis   Acute renal failure   Encephalopathy acute   Staphylococcus aureus bacteremia with sepsis    Time spent: 43min    Malissa Slay, [place], J  [facility] Pager [phone]. If 7PM-7AM, please contact night-coverage at [email or web address], password [facility] 09/15/2013, 7:37 AM  LOS: 7 days

## 2013-09-15 NOTE — Progress Notes (Signed)
[facility] ADULT ICU REPLACEMENT PROTOCOL FOR AM LAB REPLACEMENT ONLY  The patient does not apply for the [facility] Adult ICU Electrolyte Replacment Protocol based on the criteria listed below:   1. Is GFR >/= 40 ml/min? no  Patient's GFR today is 10 2. Is urine output >/= 0.5 ml/kg/hr for the last 6 hours? no Patient's UOP is  ml/kg/hr 3. Is BUN < 60 mg/dL? no  Patient's BUN today is 67 4. Abnormal electrolyte(s): K 3.5 5. Ordered repletion with: NA  6. If a panic level lab has been reported, has the CCM MD in charge been notified? no.   Physician:    Ronda Fairly A 09/15/2013 5:41 AM

## 2013-09-16 DIAGNOSIS — Z85850 Personal history of malignant neoplasm of thyroid: Secondary | ICD-10-CM

## 2013-09-16 DIAGNOSIS — N289 Disorder of kidney and ureter, unspecified: Secondary | ICD-10-CM

## 2013-09-16 LAB — COMPREHENSIVE METABOLIC PANEL
ALT: 22 U/L (ref 0–53)
AST: 26 U/L (ref 0–37)
Albumin: 1.5 g/dL — ABNORMAL LOW (ref 3.5–5.2)
Alkaline Phosphatase: 86 U/L (ref 39–117)
BUN: 81 mg/dL — ABNORMAL HIGH (ref 6–23)
CO2: 17 mEq/L — ABNORMAL LOW (ref 19–32)
Calcium: 7 mg/dL — ABNORMAL LOW (ref 8.4–10.5)
Chloride: 98 mEq/L (ref 96–112)
Creatinine, Ser: 7.6 mg/dL — ABNORMAL HIGH (ref 0.50–1.35)
GFR calc Af Amer: 8 mL/min — ABNORMAL LOW (ref >90)
GFR calc non Af Amer: 7 mL/min — ABNORMAL LOW (ref >90)
Glucose, Bld: 127 mg/dL — ABNORMAL HIGH (ref 70–99)
Potassium: 4 mEq/L (ref 3.7–5.3)
Sodium: 137 mEq/L (ref 137–147)
Total Bilirubin: 1.7 mg/dL — ABNORMAL HIGH (ref 0.3–1.2)
Total Protein: 5.7 g/dL — ABNORMAL LOW (ref 6.0–8.3)

## 2013-09-16 LAB — CBC WITH DIFFERENTIAL/PLATELET
Basophils Absolute: 0 10*3/uL (ref 0.0–0.1)
Basophils Relative: 0 % (ref 0–1)
Eosinophils Absolute: 0.4 10*3/uL (ref 0.0–0.7)
Eosinophils Relative: 2 % (ref 0–5)
HCT: 35 % — ABNORMAL LOW (ref 39.0–52.0)
Hemoglobin: 12.9 g/dL — ABNORMAL LOW (ref 13.0–17.0)
Lymphocytes Relative: 3 % — ABNORMAL LOW (ref 12–46)
Lymphs Abs: 0.7 10*3/uL (ref 0.7–4.0)
MCH: 30.3 pg (ref 26.0–34.0)
MCHC: 36.9 g/dL — ABNORMAL HIGH (ref 30.0–36.0)
MCV: 82.2 fL (ref 78.0–100.0)
Monocytes Absolute: 0.9 10*3/uL (ref 0.1–1.0)
Monocytes Relative: 4 % (ref 3–12)
Neutro Abs: 19.8 10*3/uL — ABNORMAL HIGH (ref 1.7–7.7)
Neutrophils Relative %: 91 % — ABNORMAL HIGH (ref 43–77)
Platelets: 92 10*3/uL — ABNORMAL LOW (ref 150–400)
RBC: 4.26 MIL/uL (ref 4.22–5.81)
RDW: 15.8 % — ABNORMAL HIGH (ref 11.5–15.5)
WBC: 21.8 10*3/uL — ABNORMAL HIGH (ref 4.0–10.5)

## 2013-09-16 LAB — CULTURE, BLOOD (ROUTINE X 2)
Culture: NO GROWTH
Culture: NO GROWTH

## 2013-09-16 LAB — GLUCOSE, CAPILLARY
Glucose-Capillary: 104 mg/dL — ABNORMAL HIGH (ref 70–99)
Glucose-Capillary: 117 mg/dL — ABNORMAL HIGH (ref 70–99)
Glucose-Capillary: 130 mg/dL — ABNORMAL HIGH (ref 70–99)
Glucose-Capillary: 162 mg/dL — ABNORMAL HIGH (ref 70–99)
Glucose-Capillary: 173 mg/dL — ABNORMAL HIGH (ref 70–99)
Glucose-Capillary: 176 mg/dL — ABNORMAL HIGH (ref 70–99)

## 2013-09-16 LAB — MAGNESIUM: Magnesium: 2.6 mg/dL — ABNORMAL HIGH (ref 1.5–2.5)

## 2013-09-16 LAB — PROTIME-INR
INR: 1.41 (ref 0.00–1.49)
Prothrombin Time: 16.9 seconds — ABNORMAL HIGH (ref 11.6–15.2)

## 2013-09-16 LAB — PHOSPHORUS: Phosphorus: 6 mg/dL — ABNORMAL HIGH (ref 2.3–4.6)

## 2013-09-16 LAB — OCCULT BLOOD X 1 CARD TO LAB, STOOL: Fecal Occult Bld: POSITIVE — AB

## 2013-09-16 LAB — CLOSTRIDIUM DIFFICILE BY PCR: Toxigenic C. Difficile by PCR: NEGATIVE

## 2013-09-16 MED ORDER — BOOST / RESOURCE BREEZE PO LIQD
1.0000 | Freq: Three times a day (TID) | ORAL | Status: DC
  Administered 2013-09-16: 16:00:00 via ORAL
  Administered 2013-09-16 – 2013-09-29 (×25): 1 via ORAL

## 2013-09-16 MED ORDER — ENSURE COMPLETE PO LIQD
237.0000 mL | ORAL | Status: DC
  Administered 2013-09-16: 237 mL via ORAL

## 2013-09-16 MED ORDER — ZINC OXIDE 40 % EX OINT
TOPICAL_OINTMENT | CUTANEOUS | Status: DC | PRN
  Filled 2013-09-16: qty 114

## 2013-09-16 MED ORDER — ZOLPIDEM TARTRATE 5 MG PO TABS
5.0000 mg | ORAL_TABLET | Freq: Every evening | ORAL | Status: DC | PRN
  Administered 2013-09-16 – 2013-09-17 (×2): 5 mg via ORAL
  Filled 2013-09-16 (×2): qty 1

## 2013-09-16 MED ORDER — FAMOTIDINE IN NACL 20-0.9 MG/50ML-% IV SOLN
20.0000 mg | Freq: Two times a day (BID) | INTRAVENOUS | Status: DC
  Administered 2013-09-16 – 2013-09-19 (×7): 20 mg via INTRAVENOUS
  Filled 2013-09-16 (×11): qty 50

## 2013-09-16 NOTE — Progress Notes (Signed)
Assessment/Plan:  1 Oliguric AKI -worsening, due to bacteremia/immune complex, less likely gentamicin. [name] has been stopped.  2 Staph aureus bacteremia / embolic strokes- suspected endocarditis  3 Congenital AoV disease, hx of tissue AoV replacement 2011  Plan: Will reevaluate in AM but lack of increased UOP will warrant dialysis support   Subjective: Interval History: Now at [facility] hospital Objective: Vital signs in last 24 hours: Temp:  [97.7 F (36.5 C)-99.4 F (37.4 C)] 97.8 F (36.6 C) (02/17 0821) Pulse Rate:  [95-103] 100 (02/17 0821) Resp:  [21-27] 23 (02/17 0821) BP: (115-138)/(58-103) 128/80 mmHg (02/17 0821) SpO2:  [97 %-100 %] 97 % (02/17 0821) Weight:  [96.163 kg (212 lb)-99.2 kg (218 lb 11.1 oz)] 99.2 kg (218 lb 11.1 oz) (02/17 0400) Weight change: -6.037 kg (-13 lb 5 oz)  Intake/Output from previous day: 02/16 0701 - 02/17 0700 In: 1510 [P.O.:240; I.V.:720; IV Piggyback:550] Out: 26 [Urine:25; Stool:1] Intake/Output this shift:    General appearance: alert, cooperative and slowed mentation Resp: clear to auscultation bilaterally Cardio: regular rate and rhythm  GI: soft, non-tender; bowel sounds normal; no masses,  no organomegaly Extremities: extremities normal, atraumatic, no cyanosis or edema  Lab Results:  Recent Labs  09/15/13 0341 09/16/13 0335  WBC 25.0* 21.8*  HGB 13.5 12.9*  HCT 37.4* 35.0*  PLT 81* 92*   BMET:  Recent Labs  09/15/13 0341 09/16/13 0335  NA 139 137  K 3.5* 4.0  CL 101 98  CO2 19 17*  GLUCOSE 131* 127*  BUN 67* 81*  CREATININE 5.78* 7.60*  CALCIUM 7.5* 7.0*   No results found for this basename: PTH,  in the last 72 hours Iron Studies: No results found for this basename: IRON, TIBC, TRANSFERRIN, FERRITIN,  in the last 72 hours Studies/Results: US Renal  09/15/2013   CLINICAL DATA:  Acute renal failure.  EXAM: RENAL/URINARY TRACT ULTRASOUND COMPLETE  COMPARISON:  CT abdomen and pelvis 09/06/2013.  FINDINGS: Right  Kidney:  Length: Approximately 12.7 cm. No hydronephrosis. Well-preserved cortex. No shadowing calculi. Normal parenchymal echotexture without focal abnormalities.  Left Kidney:  Length: Approximately 12.5 cm. No hydronephrosis. Well-preserved cortex. No shadowing calculi. Normal parenchymal echotexture without focal abnormalities.  Bladder:  Decompressed by Foley catheter.  IMPRESSION: Normal kidneys.  Specifically, no evidence of hydronephrosis.   Electronically Signed   By: Evangeline Dakin M.D.   On: 09/15/2013 10:55   Scheduled: . antiseptic oral rinse  15 mL Mouth Rinse q12n4p  . chlorhexidine  15 mL Mouth Rinse BID  . famotidine (PEPCID) IV  20 mg Intravenous Q12H  . insulin aspart  0-9 Units Subcutaneous 6 times per day  . levothyroxine  100 mcg Intravenous Daily  . nafcillin IV  2 g Intravenous Q4H  . rifampin (RIFADIN) IVPB  300 mg Intravenous 3 times per day  . sodium chloride  3 mL Intravenous Q12H     LOS: 8 days   Bradley [name] C 09/16/2013,10:26 AM

## 2013-09-16 NOTE — Progress Notes (Signed)
PULMONARY  / CRITICAL CARE MEDICINE  Name: Bradley [name] MRN: [id number] DOB: 1955/11/20 PCP [name], MD ADMISSION DATE:  09/08/2013  LOS 8 days   CONSULTATION DATE:  09/13/13  REFERRING MD :  DR Clydene Laming of [facility] PRIMARY SERVICE: [facility]  CHIEF COMPLAINT:  Encephalopathy. Intubation risk  BRIEF PATIENT DESCRIPTION:  58 year old male with s/p Bentall with bioprosthetic AV in august 2011. 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 Admitted 09/08/2013 with encephalopathy and septic picture with imaging showing septic embolli of brain and cultures revelaing MSSA bacteremia. Concern is for MSSA endocarditis but TEE unable to be done due to encephalopathy, and thrombocytopenia (Heme consult Dr Jana Hakim feels this is c/w infection). On 09/13/13 develping renal failure (d2 of gent stopped) and worsening encephlopathy and PCCM consulted.    SIGNIFICANT EVENTS / STUDIES:  2/09 - admit 2/10 - ID, Heme and Cards consult. ECHO ef 45%. No clear vegetations 2/14 - PCCM consult 2/15 - Junctional rhythm with precedex, mental status improved.  Transferred to [facility] for possible HD if renal fx deteriorates further  LINES / TUBES: Left Midline PICC 09/13/13>> Foley 2/13 >>>  CULTURES: 2/09 - Blood - Staph Aureus MSSA 2/09 - blood - Staph Aureus MSSA 2/10 - MRSA PCR - negative 2/11 - blood >>> negative 2/16 C.diff >>> negative  ANTIBIOTICS: Zosyn 2./9 - 2/10 Cefepime 2/10 >>2/10 Vanc 2/9 - 2/12 Nafcillin 2/10 >> Gent 2/12 >>2/14 (rise in creat) Rifampin 2/15 >>>  SUBJECTIVE: No acute events.  Low UOP -25 today so far.  Is confused on exam, able to tell me that he is at [facility]; however, thinks that the year is 66 and the president of [place] is Maudie Flakes.  However, he states he feels better than he did yesterday.  Denies any chest pain, SOB, abdominal pain, N/V/D, fevers/chills/sweats. Spoke with Nephrology MD, may wait and see how pt does  before procedding with HD, he will examine pt and decide best course of action.   VITAL SIGNS: Filed Vitals:   09/16/13 0000 09/16/13 0400 09/16/13 0732 09/16/13 0821  BP: 116/81 115/80 134/68 128/80  Pulse: 101 101 100 100  Temp: 99.2 F (37.3 C) 99.4 F (37.4 C) 98.7 F (37.1 C) 97.8 F (36.6 C)  TempSrc: Oral Axillary Oral Oral  Resp: 22 21 21 23   Height:      Weight:  218 lb 11.1 oz (99.2 kg)    SpO2: 98% 100% 97% 97%   INTAKE / OUTPUT: I/O last 3 completed shifts: In: 2233.7 [P.O.:240; I.V.:1093.7; IV Piggyback:900] Out: 28 [Urine:27; Stool:1]  PHYSICAL EXAMINATION: General:  Pleasant well built male, in NAD. Neuro: A&O to person and place, not time.  Somewhat anxious, shaking legs. No Asterixis. HEENT:  Neck supple. Bearded male Cardiovascular:  RRR, 3/6 SEM at RUSB/LUSB, no rubs/gallops. Lungs:  CTA bilaterally, no W/R/R. Abdomen:  BS x 4, soft, NT/ND. Musculoskeletal:  No cyanosis, No clubbing. No edema. No janeway lesions/splinter hemorrhages. Skin:  Intact anteriorly  LABS: PULMONARY  Recent Labs Lab 09/12/13 1650 09/13/13 0755 09/13/13 1315 09/13/13 2042 09/14/13 0745  PHART 7.543* 7.529* 7.470* 7.543* 7.496*  PCO2ART 28.7* 28.9* 33.6* 27.8* 28.7*  PO2ART 63.1* 72.4* 72.4* 67.3* 65.2*  HCO3 24.6* 24.0 24.2* 23.7 21.9  TCO2 21.5 20.8 21.2 20.5 19.0  O2SAT 94.6 96.2 95.9 95.5 94.8   CBC  Recent Labs Lab 09/14/13 0445 09/15/13 0341 09/16/13 0335  HGB 12.8* 13.5 12.9*  HCT 36.4* 37.4* 35.0*  WBC 26.5* 25.0* 21.8*  PLT 49* 81* 92*   COAGULATION  Recent Labs Lab 09/12/13 1420 09/13/13 0603 09/14/13 0445 09/15/13 0341 09/16/13 0335  INR 1.23 1.34 1.37 1.17 1.41   CARDIAC    Recent Labs Lab 09/10/13 1500 09/10/13 2315 09/12/13 0913 09/12/13 1419 09/12/13 2010  TROPONINI 2.05* 1.78* 1.52* 1.54* 1.39*    Recent Labs Lab 09/10/13 0753 09/14/13 0445  PROBNP 37349.0* >70000.0*   CHEMISTRY  Recent Labs Lab 09/11/13 0750   09/12/13 1730  09/13/13 0603  09/13/13 2045 09/14/13 0040 09/14/13 0445  09/15/13 0341 09/16/13 0335  NA  --   < > 148*  < > 147  < > 144 141 141  --  139 137  K  --   < > 2.8*  --  3.1*  < >  --   --  2.9*  < > 3.5* 4.0  CL  --   < > 112  --  110  --   --   --  103  --  101 98  CO2  --   < > 24  --  24  --   --   --  21  --  19 17*  GLUCOSE  --   < > 255*  --  161*  --   --   --  158*  --  131* 127*  BUN  --   < > 34*  --  39*  --   --   --  56*  --  67* 81*  CREATININE  --   < > 1.31  --  1.89*  --   --   --  3.89*  --  5.78* 7.60*  CALCIUM  --   < > 8.0*  --  7.8*  --   --   --  7.4*  --  7.5* 7.0*  MG 2.2  --  2.4  --   --   --   --   --  2.3  --  2.4 2.6*  PHOS  --   --   --   --   --   --   --   --  2.6  --  4.5 6.0*  < > = values in this interval not displayed. Estimated Creatinine Clearance: 12.4 ml/min (by C-G formula based on Cr of 7.6).  LIVER  Recent Labs Lab 09/12/13 1420 09/12/13 1730 09/13/13 0603 09/14/13 0445 09/15/13 0341 09/16/13 0335  AST  --  27 26 24 23 26   ALT  --  30 29 26 24 22   ALKPHOS  --  73 85 81 84 86  BILITOT  --  1.3* 1.2 1.5* 2.2* 1.7*  PROT  --  5.3* 5.4* 5.4* 5.7* 5.7*  ALBUMIN  --  1.9* 1.8* 1.7* 1.5* 1.5*  INR 1.23  --  1.34 1.37 1.17 1.41   INFECTIOUS  Recent Labs Lab 09/10/13 1500 09/13/13 1241 09/14/13 0445 09/15/13 0341  LATICACIDVEN 1.3 1.2  --   --   PROCALCITON  --  1.07 1.50 2.42   ENDOCRINE CBG (last 3)   Recent Labs  09/16/13 0020 09/16/13 0441 09/16/13 0820  GLUCAP 117* 130* 104*    IMAGING x48h  US Renal  09/15/2013   CLINICAL DATA:  Acute renal failure.  EXAM: RENAL/URINARY TRACT ULTRASOUND COMPLETE  COMPARISON:  CT abdomen and pelvis 09/06/2013.  FINDINGS: Right Kidney:  Length: Approximately 12.7 cm. No hydronephrosis. Well-preserved cortex. No shadowing calculi.  Normal parenchymal echotexture without focal abnormalities.  Left Kidney:  Length: Approximately 12.5 cm. No hydronephrosis. Well-preserved  cortex. No shadowing calculi. Normal parenchymal echotexture without focal abnormalities.  Bladder:  Decompressed by Foley catheter.  IMPRESSION: Normal kidneys.  Specifically, no evidence of hydronephrosis.   Electronically Signed   By: Evangeline Dakin M.D.   On: 09/15/2013 10:55   ASSESSMENT / PLAN:  PULMONARY A:  At Risk Acute Respiratory Failure - potential for intubation due to encephalopathy. P:   - Monitor resp status closely. - Pulmonary hygiene. - Oxygen to support sats > 93%.  CARDIOVASCULAR A:  NSTEMI  Acute Systolic CHF - BNP [id number] Hx BENTALL (2011) - [name]  P:  - [facility] input - Tele monitoring - Hold TEE until more alert & awake, acute issues resolved.  Note, may need intubation for airway protection during procedure.  RENAL A:   Acute Kidney Injury - on gent >>> dc'd 2/14. Cr continues to rise. Hypokalemia - resolved. P:   [name] stopped - Nephrology Consult, possible HD given renal fx decline?  GASTROINTESTINAL A:   At risk aspiration P:   - NPO with altered mental status. - Consider TF if intubated.  HEMATOLOGIC A:   Thrombocytopenia - in setting of sepsis P:  - Monitior  INFECTIOUS A:   Leukocytosis - in setting of sepsis. MSSA bacteremia - source suspected bioprosthetic aortic valve, pending TEE for further evaluation. P:   - Per ID  ENDOCRINE A:   Hypothyroidism    P:   - Continue synthroid per [facility].  NEUROLOGIC A:   Encephalopathic  P:   - Supportive care. - Minimize sedating medications as able.  Montey Hora, PA - [facility] Pgr: [phone]  or [phone]  Hold in SDU, once renal service decide on HD will place catheter.  Will need TEE and to address dietary status, he passed swallow evaluation but remained NPO.  Will leave for primary to address.  Cortisol level ordered since last was 5.7 but hemodynamically stable.  Patient seen and examined, agree with above note.  I  dictated the care and orders written for this patient under my direction.  Rush Farmer, MD [phone]

## 2013-09-16 NOTE — Progress Notes (Signed)
Patient ID: Bradley [name], male   DOB: 10-13-1955, 58 y.o.   MRN: [id number]         [facility] Infectious [facility]    Date of Admission:  09/08/2013    Total days of antibiotics 8         Principal Problem:   Sepsis Active Problems:   HYPOTHYROIDISM- TSH 7.5   Aortic valve replaced- Bentall propceedure 2011   Hypertension   Depression   LBBB (left bundle branch block)   Major depressive disorder, recurrent, severe with psychotic features- hospitalized in Jan 2013   Generalized anxiety disorder   Altered mental state   Encephalitis   Septic embolism   HCAP (healthcare-associated pneumonia)   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   Acute diastolic heart failure   NSTEMI (non-ST elevated myocardial infarction)   Agitation   Acute respiratory alkalosis   Acute renal failure   Encephalopathy acute   Staphylococcus aureus bacteremia with sepsis   . antiseptic oral rinse  15 mL Mouth Rinse q12n4p  . chlorhexidine  15 mL Mouth Rinse BID  . famotidine (PEPCID) IV  20 mg Intravenous Q12H  . feeding supplement (ENSURE COMPLETE)  237 mL Oral Q24H  . feeding supplement (RESOURCE BREEZE)  1 Container Oral TID WC  . insulin aspart  0-9 Units Subcutaneous 6 times per day  . levothyroxine  100 mcg Intravenous Daily  . nafcillin IV  2 g Intravenous Q4H  . rifampin (RIFADIN) IVPB  300 mg Intravenous 3 times per day  . sodium chloride  3 mL Intravenous Q12H    Subjective: He is much more calm and alert. He is talking with his wife.   Past Medical History  Diagnosis Date  . Anxiety   . Depression   . History of thyroid cancer   . Hypertension   . Hypothyroidism, postsurgical   . Aortic stenosis     s/p Bentall with bioprosthetic AVR 02/2010; Last echo (9/11): Moderate LVH, EF 45-50%, AVR functioning appropriately, aortic valve mean gradient 21, diastolic dysfunction. Chest MRA (2/13): Mild to moderate dilatation at the sinus of Valsalva at 4.1 cm, mild dilatation ascending aorta  distal to the tube graft at 3.9 cm, moderate dilatation of the innominate artery a 2.1 cm;    . Hx of cardiac cath     a. LHC in 02/2010: normal cors  . Hx of echocardiogram 2014    Echo (10/14): Severe LVH, EF 60-65%, normal wall motion, grade 1 diastolic dysfunction, AVR functioning normally, mild aortic stenosis (mean 19), moderately dilated aorta, mild LAE, mild RVE    History  Substance Use Topics  . Smoking status: Never Smoker   . Smokeless tobacco: Never Used  . Alcohol Use: No    Family History  Problem Relation Age of Onset  . Heart disease Father     No Known Allergies  Objective: Temp:  [97.8 F (36.6 C)-99.4 F (37.4 C)] 98.7 F (37.1 C) (02/17 1300) Pulse Rate:  [100-117] 117 (02/17 1400) Resp:  [10-26] 18 (02/17 1400) BP: (112-134)/(68-92) 125/92 mmHg (02/17 1400) SpO2:  [95 %-100 %] 97 % (02/17 1400) Weight:  [96.163 kg (212 lb)-99.2 kg (218 lb 11.1 oz)] 99.2 kg (218 lb 11.1 oz) (02/17 0400)  General: He is alert and talkative. He knows he is in the hospital but does not know which one and does not know why he is here. Skin: No rash Lungs: Clear Cor: Regular [id number] 2/6 systolic murmur heard best at the right sternal  border Abdomen: Nontender   Lab Results Lab Results  Component Value Date   WBC 21.8* 09/16/2013   HGB 12.9* 09/16/2013   HCT 35.0* 09/16/2013   MCV 82.2 09/16/2013   PLT 92* 09/16/2013    Lab Results  Component Value Date   CREATININE 7.60* 09/16/2013   BUN 81* 09/16/2013   NA 137 09/16/2013   K 4.0 09/16/2013   CL 98 09/16/2013   CO2 17* 09/16/2013    Lab Results  Component Value Date   ALT 22 09/16/2013   AST 26 09/16/2013   ALKPHOS 86 09/16/2013   BILITOT 1.7* 09/16/2013      Microbiology: Recent Results (from the past 240 hour(s))  CULTURE, BLOOD (ROUTINE X 2)     Status: None   Collection Time    09/08/13 10:00 PM      Result Value Ref Range Status   Specimen Description BLOOD RIGHT ARM   Final   Special Requests BOTTLES DRAWN  AEROBIC AND ANAEROBIC 5CC   Final   Culture  Setup Time     Final   Value: 09/09/2013 03:30     Performed at [facility]   Culture     Final   Value: STAPHYLOCOCCUS AUREUS     Note: RIFAMPIN AND GENTAMICIN SHOULD NOT BE USED AS SINGLE DRUGS FOR TREATMENT OF STAPH INFECTIONS.     Note: Gram Stain Report Called to,Read Back By and Verified With: NEELY RICHARDSON @ 1220 09/09/13 BY KRAWS     Performed at [facility]   Report Status 09/11/2013 FINAL   Final   Organism ID, Bacteria STAPHYLOCOCCUS AUREUS   Final  URINE CULTURE     Status: None   Collection Time    09/08/13 10:03 PM      Result Value Ref Range Status   Specimen Description URINE, RANDOM   Final   Special Requests NONE   Final   Culture  Setup Time     Final   Value: 09/09/2013 04:59     Performed at [facility]     Final   Value: >=100,000 COLONIES/ML     Performed at [facility]   Culture     Final   Value: STAPHYLOCOCCUS AUREUS     Note: RIFAMPIN AND GENTAMICIN SHOULD NOT BE USED AS SINGLE DRUGS FOR TREATMENT OF STAPH INFECTIONS.     Performed at [facility]   Report Status 09/11/2013 FINAL   Final   Organism ID, Bacteria STAPHYLOCOCCUS AUREUS   Final  CULTURE, BLOOD (ROUTINE X 2)     Status: None   Collection Time    09/08/13 11:36 PM      Result Value Ref Range Status   Specimen Description BLOOD RIGHT ANTECUBITAL   Final   Special Requests BOTTLES DRAWN AEROBIC AND ANAEROBIC 5CC   Final   Culture  Setup Time     Final   Value: 09/09/2013 03:29     Performed at [facility]   Culture     Final   Value: STAPHYLOCOCCUS AUREUS     Note: SUSCEPTIBILITIES PERFORMED ON PREVIOUS CULTURE WITHIN THE LAST 5 DAYS.     Note: Gram Stain Report Called to,Read Back By and Verified With:  NEELY RICHARDSON @ 1220 09/09/13 BY KRAWS     Performed at [facility]   Report Status 09/11/2013 FINAL   Final  MRSA PCR SCREENING     Status: None   Collection  Time    09/09/13  4:44 AM      Result Value Ref Range Status   MRSA by PCR NEGATIVE  NEGATIVE Final   Comment:            The GeneXpert MRSA Assay (FDA     approved for NASAL specimens     only), is one component of a     comprehensive MRSA colonization     surveillance program. It is not     intended to diagnose MRSA     infection nor to guide or     monitor treatment for     MRSA infections.  CULTURE, BLOOD (ROUTINE X 2)     Status: None   Collection Time    09/10/13 11:05 AM      Result Value Ref Range Status   Specimen Description BLOOD RIGHT HAND   Final   Special Requests BOTTLES DRAWN AEROBIC ONLY 5CC   Final   Culture  Setup Time     Final   Value: 09/10/2013 14:22     Performed at [facility]   Culture     Final   Value: NO GROWTH 5 DAYS     Performed at [facility]   Report Status 09/16/2013 FINAL   Final  CULTURE, BLOOD (ROUTINE X 2)     Status: None   Collection Time    09/10/13 11:06 AM      Result Value Ref Range Status   Specimen Description BLOOD LEFT HAND   Final   Special Requests BOTTLES DRAWN AEROBIC AND ANAEROBIC 5CC   Final   Culture  Setup Time     Final   Value: 09/10/2013 14:27     Performed at [facility]   Culture     Final   Value: NO GROWTH 5 DAYS     Performed at [facility]   Report Status 09/16/2013 FINAL   Final  CLOSTRIDIUM DIFFICILE BY PCR     Status: None   Collection Time    09/15/13  2:00 AM      Result Value Ref Range Status   C difficile by pcr NEGATIVE  NEGATIVE Final   Comment: Performed at [facility]  MRSA PCR SCREENING     Status: None   Collection Time    09/15/13  5:31 PM      Result Value Ref Range Status   MRSA by PCR NEGATIVE  NEGATIVE Final   Comment:            The GeneXpert MRSA Assay (FDA     approved for NASAL specimens     only), is one component of a     comprehensive MRSA colonization     surveillance program. It is not     intended to diagnose MRSA      infection nor to guide or     monitor treatment for     MRSA infections.  CLOSTRIDIUM DIFFICILE BY PCR     Status: None   Collection Time    09/16/13 10:53 AM      Result Value Ref Range Status   C difficile by pcr NEGATIVE  NEGATIVE Final    Studies/Results: US Renal  09/15/2013   CLINICAL DATA:  Acute renal failure.  EXAM: RENAL/URINARY TRACT ULTRASOUND COMPLETE  COMPARISON:  CT abdomen and pelvis 09/06/2013.  FINDINGS: Right Kidney:  Length: Approximately 12.7 cm. No hydronephrosis. Well-preserved cortex. No shadowing calculi. Normal parenchymal echotexture without  focal abnormalities.  Left Kidney:  Length: Approximately 12.5 cm. No hydronephrosis. Well-preserved cortex. No shadowing calculi. Normal parenchymal echotexture without focal abnormalities.  Bladder:  Decompressed by Foley catheter.  IMPRESSION: Normal kidneys.  Specifically, no evidence of hydronephrosis.   Electronically Signed   By: Evangeline Dakin M.D.   On: 09/15/2013 10:55    Assessment: He is making some clinical and microbiologic progress but has progressive acute renal insufficiency.  Plan: 1. Continue nafcillin and rifampin  Michel Bickers, MD [facility] [phone] pager   [phone] cell 09/16/2013, 5:07 PM

## 2013-09-16 NOTE — Progress Notes (Signed)
Patient Name: Bradley [name] Date of Encounter: 09/16/2013  Principal Problem:   Sepsis Active Problems:   HYPOTHYROIDISM- TSH 7.5   Aortic valve replaced- Bentall propceedure 2011   Hypertension   Depression   LBBB (left bundle branch block)   Major depressive disorder, recurrent, severe with psychotic features- hospitalized in Jan 2013   Generalized anxiety disorder   Altered mental state   Encephalitis   Septic embolism   HCAP (healthcare-associated pneumonia)   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   Acute diastolic heart failure   NSTEMI (non-ST elevated myocardial infarction)   Agitation   Acute respiratory alkalosis   Acute renal failure   Encephalopathy acute   Staphylococcus aureus bacteremia with sepsis   Length of Stay: 8  SUBJECTIVE  Substantially more alert, but remains a little disoriented. Knows he is in [place], had difficulty identifying this facility as being a hospital. No dyspnea, afebrile. WBC slightly lower, platelets slightly better UO is diminishing. Renal function parameters continue rapid deterioration. In/out matched last 24h  CURRENT MEDS . antiseptic oral rinse  15 mL Mouth Rinse q12n4p  . chlorhexidine  15 mL Mouth Rinse BID  . insulin aspart  0-9 Units Subcutaneous 6 times per day  . levothyroxine  100 mcg Intravenous Daily  . nafcillin IV  2 g Intravenous Q4H  . rifampin (RIFADIN) IVPB  300 mg Intravenous 3 times per day  . sodium chloride  3 mL Intravenous Q12H    OBJECTIVE   Intake/Output Summary (Last 24 hours) at 09/16/13 0848 Last data filed at 09/16/13 0735  Gross per 24 hour  Intake   1510 ml  Output   1426 ml  Net     84 ml   Filed Weights   09/15/13 0352 09/15/13 1800 09/16/13 0400  Weight: 102.2 kg (225 lb 5 oz) 96.163 kg (212 lb) 99.2 kg (218 lb 11.1 oz)    PHYSICAL EXAM Filed Vitals:   09/16/13 0000 09/16/13 0400 09/16/13 0732 09/16/13 0821  BP: 116/81 115/80 134/68 128/80  Pulse: 101 101 100 100  Temp: 99.2 F  (37.3 C) 99.4 F (37.4 C) 98.7 F (37.1 C) 97.8 F (36.6 C)  TempSrc: Oral Axillary Oral Oral  Resp: 22 21 21 23   Height:      Weight:  99.2 kg (218 lb 11.1 oz)    SpO2: 98% 100% 97% 97%   General: Alert, oriented x3, no distress Head: no evidence of trauma, PERRL, EOMI, no exophtalmos or lid lag, no myxedema, no xanthelasma; normal ears, nose and oropharynx Neck: normal jugular venous pulsations and no hepatojugular reflux; brisk carotid pulses without delay and no carotid bruits Chest: clear to auscultation, no signs of consolidation by percussion or palpation, normal fremitus, symmetrical and full respiratory excursions Cardiovascular: normal position and quality of the apical impulse, regular rhythm, normal first and second heart sounds, no rubs or gallops, 3/6 systolic ejection murmur, no diastolic murmur Abdomen: no tenderness or distention, no masses by palpation, no abnormal pulsatility or arterial bruits, normal bowel sounds, no hepatosplenomegaly Extremities: no clubbing, cyanosis or edema; 2+ radial, ulnar and brachial pulses bilaterally; 2+ right femoral, posterior tibial and dorsalis pedis pulses; 2+ left femoral, posterior tibial and dorsalis pedis pulses; no subclavian or femoral bruits Neurological: grossly nonfocal  LABS  CBC  Recent Labs  09/15/13 0341 09/16/13 0335  WBC 25.0* 21.8*  NEUTROABS 22.9* 19.8*  HGB 13.5 12.9*  HCT 37.4* 35.0*  MCV 82.4 82.2  PLT 81* 92*   Basic Metabolic Panel  Recent Labs  09/15/13 0341 09/16/13 0335  NA 139 137  K 3.5* 4.0  CL 101 98  CO2 19 17*  GLUCOSE 131* 127*  BUN 67* 81*  CREATININE 5.78* 7.60*  CALCIUM 7.5* 7.0*  MG 2.4 2.6*  PHOS 4.5 6.0*   Liver Function Tests  Recent Labs  09/15/13 0341 09/16/13 0335  AST 23 26  ALT 24 22  ALKPHOS 84 86  BILITOT 2.2* 1.7*  PROT 5.7* 5.7*  ALBUMIN 1.5* 1.5*   Thyroid Function Tests  Recent Labs  09/14/13 1252  TSH 10.174*    Radiology Studies Imaging  results have been reviewed and US Renal  09/15/2013   CLINICAL DATA:  Acute renal failure.  EXAM: RENAL/URINARY TRACT ULTRASOUND COMPLETE  COMPARISON:  CT abdomen and pelvis 09/06/2013.  FINDINGS: Right Kidney:  Length: Approximately 12.7 cm. No hydronephrosis. Well-preserved cortex. No shadowing calculi. Normal parenchymal echotexture without focal abnormalities.  Left Kidney:  Length: Approximately 12.5 cm. No hydronephrosis. Well-preserved cortex. No shadowing calculi. Normal parenchymal echotexture without focal abnormalities.  Bladder:  Decompressed by Foley catheter.  IMPRESSION: Normal kidneys.  Specifically, no evidence of hydronephrosis.   Electronically Signed   By: Evangeline Dakin M.D.   On: 09/15/2013 10:55    TELE NSR, looks like PR is lengthening  ECG NSR, LVH, PR was 164 ms on 2/14  ASSESSMENT AND PLAN Some signs of improvement by clinical and hematological standards, but seems to be heading for oliguric ARF with probable need for HD. So far no sign of serious valvular complications. Recheck ECG - a lengthening PR may be a sign of perivalvular abscess Plan TEE later this week. Would like to see more resolution of confusion before we give him sedation.   Sanda Klein, MD, [facility] [phone] office [phone] pager 09/16/2013 8:48 AM

## 2013-09-16 NOTE — Progress Notes (Signed)
[name]  Bradley [name] [id number] DOB: 1955-10-17 DOA: 09/08/2013 PCP: [name], MD  Brief narrative: 58 year old male with history of aortic stenosis, status post pediatric aortic valvulotomy at age 84 followed by Bentall procedure 02/2000 with tissue AVR, cardiac cath 02/2010 with normal coronaries, LBBB, thyroid cancer-post thyroidectomy hypothyroidism, HTN and depression has been generally unwell for 2-3 weeks, recently seen in the ED on 09/06/13 and assessed as influenza related vomiting, diarrhea, abdominal pain, body aches, fever and mild confusion. Family was given option of hospitalization versus discharge home-family opted to go home and he was discharged on Tamiflu. However, patient worsened at home with progressive confusion/disorientation and was admitted with sepsis, septic brain embolization and rule out infective endocarditis   Assessment/Plan: Sepsis with MSSA bacteremia/? infective endocarditis -  Blood cultures x2 shows gram-positive cocci in clusters (MSSA).  -. Influenza panel PCR negative.  -Chest x-ray suggested early pneumonia of left lung base with small left pleural effusion.  -UA cx with MSSA UTI -Transthoracic echo (2/10) without vegetation- Cardiology:TEE planned when renal function more stable-has prior tissue AVR-Cards plans check EKG to monitor for lengthening of P-R interva which can herald perivalvular abscess -Infectious disease following  MSSA UTI - continue anbx  Metabolic encephalopathy/ septic emboli/? Left hemiparesis - Most likely secondary to severe sepsis,azotemia and septic brain emboli.  -MRI Brain (2/10) with multiple bilateral supra tentorial and right cerebellar regions c/wacute partially hemorrhagic infarcts suspected 2/2 septic emboli -Continue supportive care and tx underlying causes -cognition improved after Precedex off >48 hrs -Neurolgy was consulted  Severe thrombocytopenia  -  Platelet counts have dropped from 100 to a nadir of 23 with current trend up -No schistocytes on peripheral smear.  -DIC panel positive - HIT panel negative therefore due to severe sepsis -Hematology following -No antiplatelets or heparin products at this time.  -Transfuse if has overt bleeding or platelet count less than 10 K.    Acute Renal Failure/Acute tubular necrosis -baseline renal fnx: 18/0.93 -progressive since admit so Nephro consulted 2/15 -did receive Gentamicin this admit but due to ARF but was dc'd 2/13 -Dr. Sherral Hammers (2/16) spoke with Dr. Erling Cruz (nephrology) about my concern of patient's increasing renal failure. Dr. Florene Glen evaluated patient and agreed that patient should be [facility] for possible dialysis vs [facility]. -continues with good UOP but cr has increased -no plans to begin HD at this point -Renal US WNL- Renal artery duplex pending  Hypokalemia  - replete prn  NSTEMI (Type II) - peak TNI 2.26  -No plans for cath at this point  Acute Systolic HF and Chronic grade 1 Diastolic CHF  - ECHO Oct [id number] with EF 60-655 with severe LVH and no RWMA -ECHO this admit (2/10) new lower EF 45=50% with septal and apical hypokinesis -consider OP ECHO to see if EF recovers and if RWMA resolve  History of thyroid cancer, post thyroidectomy hypothyroidism  - TSH initially 7.675 on 2/10 and further increased to 10.174 ? sick euthyroid syndrome -Free T4= 0.84 but T3 was very (29.1)  low so Synthroid was increased by previous attending MD -repeat labs in 4-6 weeks  Hypertension  - Continue when necessary IV metoprolol.  -Holding lisinopril.   Insomnia -Try a low dose Ambien  Hyperglycemia  - Hemoglobin A1c = 5.7  -Continue sensitive SSI.   History of depression  - hold Zoloft secondary to acute encephalopathy.   Hyponatremia  -Resolved  DVT prophylaxis: SCDs Code Status: Full Family Communication: Sister at bedside Disposition Plan/Expected LOS:  Step down  Consultants: Cardiology Critical care medicine Nephrology Neurology Infectious disease  Procedures: None  Antibiotics: Gentamicin 2/10>>  Nafcillin 2/10>>  Vancomycin 2/10>>2/12   HPI/Subjective: Patient awake but does not appear to be a what I presume his preadmission baseline would be. No specific complaints verbalized except for insomnia.  Objective: Blood pressure 128/80, pulse 100, temperature 97.8 F (36.6 C), temperature source Oral, resp. rate 23, height 6\' 1"  (1.854 m), weight 218 lb 11.1 oz (99.2 kg), SpO2 97.00%.  Intake/Output Summary (Last 24 hours) at 09/16/13 1126 Last data filed at 09/16/13 [id number]  Gross per 24 hour  Intake   1510 ml  Output     25 ml  Net   1485 ml     Exam: General: No acute respiratory distress Lungs: Clear to auscultation bilaterally without wheezes or crackles, RA Cardiovascular: Regular rate and rhythm with systolic murmur over aortic region without gallop or rub; otherwise normal S1 and S2, no peripheral edema or JVD Abdomen: Nontender, nondistended, soft, bowel sounds positive, no rebound, no ascites, no appreciable mass Musculoskeletal: No significant cyanosis, clubbing of bilateral lower extremities Neurological: Alert and oriented x 3, left side weaker and appears somewhat neglectful of left-side at this time  Scheduled Meds:  Scheduled Meds: . antiseptic oral rinse  15 mL Mouth Rinse q12n4p  . chlorhexidine  15 mL Mouth Rinse BID  . famotidine (PEPCID) IV  20 mg Intravenous Q12H  . feeding supplement (ENSURE COMPLETE)  237 mL Oral Q24H  . feeding supplement (RESOURCE BREEZE)  1 Container Oral TID WC  . insulin aspart  0-9 Units Subcutaneous 6 times per day  . levothyroxine  100 mcg Intravenous Daily  . nafcillin IV  2 g Intravenous Q4H  . rifampin (RIFADIN) IVPB  300 mg Intravenous 3 times per day  . sodium chloride  3 mL Intravenous Q12H   Continuous Infusions: . dextrose 30 mL (09/14/13 1841)    Data  Reviewed: Basic Metabolic Panel:  Recent Labs Lab 09/11/13 0750  09/12/13 1730  09/13/13 0603  09/13/13 1241  09/13/13 2045 09/14/13 0040 09/14/13 0445 09/14/13 1252 09/15/13 0341 09/16/13 0335  NA  --   < > 148*  < > 147  < > 145  < > 144 141 141  --  139 137  K  --   < > 2.8*  --  3.1*  --  3.5*  --   --   --  2.9* 3.5* 3.5* 4.0  CL  --   < > 112  --  110  --   --   --   --   --  103  --  101 98  CO2  --   < > 24  --  24  --   --   --   --   --  21  --  19 17*  GLUCOSE  --   < > 255*  --  161*  --   --   --   --   --  158*  --  131* 127*  BUN  --   < > 34*  --  39*  --   --   --   --   --  56*  --  67* 81*  CREATININE  --   < > 1.31  --  1.89*  --   --   --   --   --  3.89*  --  5.78* 7.60*  CALCIUM  --   < > 8.0*  --  7.8*  --   --   --   --   --  7.4*  --  7.5* 7.0*  MG 2.2  --  2.4  --   --   --   --   --   --   --  2.3  --  2.4 2.6*  PHOS  --   --   --   --   --   --   --   --   --   --  2.6  --  4.5 6.0*  < > = values in this interval not displayed. Liver Function Tests:  Recent Labs Lab 09/12/13 1730 09/13/13 0603 09/14/13 0445 09/15/13 0341 09/16/13 0335  AST 27 26 24 23 26   ALT 30 29 26 24 22   ALKPHOS 73 85 81 84 86  BILITOT 1.3* 1.2 1.5* 2.2* 1.7*  PROT 5.3* 5.4* 5.4* 5.7* 5.7*  ALBUMIN 1.9* 1.8* 1.7* 1.5* 1.5*   No results found for this basename: LIPASE, AMYLASE,  in the last 168 hours No results found for this basename: AMMONIA,  in the last 168 hours CBC:  Recent Labs Lab 09/12/13 1420 09/13/13 0603 09/14/13 0445 09/15/13 0341 09/16/13 0335  WBC 26.9* 27.9* 26.5* 25.0* 21.8*  NEUTROABS 22.5* 24.0* 24.0* 22.9* 19.8*  HGB 13.4 13.0 12.8* 13.5 12.9*  HCT 37.7* 37.0* 36.4* 37.4* 35.0*  MCV 84.5 84.9 84.7 82.4 82.2  PLT 48* 48* 49* 81* 92*   Cardiac Enzymes:  Recent Labs Lab 09/10/13 1500 09/10/13 2315 09/12/13 0913 09/12/13 1419 09/12/13 1730 09/12/13 2010  CKTOTAL  --   --   --   --  122  --   TROPONINI 2.05* 1.78* 1.52* 1.54*  --   1.39*   BNP (last 3 results)  Recent Labs  09/10/13 0753 09/14/13 0445  PROBNP 37349.0* >70000.0*   CBG:  Recent Labs Lab 09/15/13 1534 09/15/13 2017 09/16/13 0020 09/16/13 0441 09/16/13 0820  GLUCAP 118* 141* 117* 130* 104*    Recent Results (from the past 240 hour(s))  CULTURE, BLOOD (ROUTINE X 2)     Status: None   Collection Time    09/08/13 10:00 PM      Result Value Ref Range Status   Specimen Description BLOOD RIGHT ARM   Final   Special Requests BOTTLES DRAWN AEROBIC AND ANAEROBIC 5CC   Final   Culture  Setup Time     Final   Value: 09/09/2013 03:30     Performed at [facility]   Culture     Final   Value: STAPHYLOCOCCUS AUREUS     Note: RIFAMPIN AND GENTAMICIN SHOULD NOT BE USED AS SINGLE DRUGS FOR TREATMENT OF STAPH INFECTIONS.     Note: Gram Stain Report Called to,Read Back By and Verified With: NEELY RICHARDSON @ 1220 09/09/13 BY KRAWS     Performed at [facility]   Report Status 09/11/2013 FINAL   Final   Organism ID, Bacteria STAPHYLOCOCCUS AUREUS   Final  URINE CULTURE     Status: None   Collection Time    09/08/13 10:03 PM      Result Value Ref Range Status   Specimen Description URINE, RANDOM   Final   Special Requests NONE   Final   Culture  Setup Time     Final   Value: 09/09/2013 04:59     Performed at [facility]   Colony Count     Final   Value: >=100,000 COLONIES/ML     Performed at [facility]     Final   Value: STAPHYLOCOCCUS AUREUS     Note: RIFAMPIN AND GENTAMICIN SHOULD NOT BE USED AS SINGLE DRUGS FOR TREATMENT OF STAPH INFECTIONS.     Performed at [facility]   Report Status 09/11/2013 FINAL   Final   Organism ID, Bacteria STAPHYLOCOCCUS AUREUS   Final  CULTURE, BLOOD (ROUTINE X 2)     Status: None   Collection Time    09/08/13 11:36 PM      Result Value Ref Range Status   Specimen Description BLOOD RIGHT ANTECUBITAL   Final   Special Requests BOTTLES DRAWN AEROBIC AND  ANAEROBIC 5CC   Final   Culture  Setup Time     Final   Value: 09/09/2013 03:29     Performed at [facility]   Culture     Final   Value: STAPHYLOCOCCUS AUREUS     Note: SUSCEPTIBILITIES PERFORMED ON PREVIOUS CULTURE WITHIN THE LAST 5 DAYS.     Note: Gram Stain Report Called to,Read Back By and Verified With:  NEELY RICHARDSON @ 1220 09/09/13 BY KRAWS     Performed at [facility]   Report Status 09/11/2013 FINAL   Final  MRSA PCR SCREENING     Status: None   Collection Time    09/09/13  4:44 AM      Result Value Ref Range Status   MRSA by PCR NEGATIVE  NEGATIVE Final   Comment:            The GeneXpert MRSA Assay (FDA     approved for NASAL specimens     only), is one component of a     comprehensive MRSA colonization     surveillance program. It is not     intended to diagnose MRSA     infection nor to guide or     monitor treatment for     MRSA infections.  CULTURE, BLOOD (ROUTINE X 2)     Status: None   Collection Time    09/10/13 11:05 AM      Result Value Ref Range Status   Specimen Description BLOOD RIGHT HAND   Final   Special Requests BOTTLES DRAWN AEROBIC ONLY 5CC   Final   Culture  Setup Time     Final   Value: 09/10/2013 14:22     Performed at [facility]   Culture     Final   Value: NO GROWTH 5 DAYS     Performed at [facility]   Report Status 09/16/2013 FINAL   Final  CULTURE, BLOOD (ROUTINE X 2)     Status: None   Collection Time    09/10/13 11:06 AM      Result Value Ref Range Status   Specimen Description BLOOD LEFT HAND   Final   Special Requests BOTTLES DRAWN AEROBIC AND ANAEROBIC 5CC   Final   Culture  Setup Time     Final   Value: 09/10/2013 14:27     Performed at [facility]   Culture     Final   Value: NO GROWTH 5 DAYS     Performed at [facility]   Report Status 09/16/2013 FINAL   Final  CLOSTRIDIUM DIFFICILE BY PCR     Status: None   Collection Time  09/15/13  2:00 AM      Result Value  Ref Range Status   C difficile by pcr NEGATIVE  NEGATIVE Final   Comment: Performed at [facility]  MRSA PCR SCREENING     Status: None   Collection Time    09/15/13  5:31 PM      Result Value Ref Range Status   MRSA by PCR NEGATIVE  NEGATIVE Final   Comment:            The GeneXpert MRSA Assay (FDA     approved for NASAL specimens     only), is one component of a     comprehensive MRSA colonization     surveillance program. It is not     intended to diagnose MRSA     infection nor to guide or     monitor treatment for     MRSA infections.     Studies:  Recent x-ray studies have been reviewed in detail by the Attending Physician  Time spent :     Erin Hearing, [place] [facility] Office  [phone] Pager [phone]  **If unable to reach the above provider after paging please contact the [place] @ [phone]  On-Call/Text Page:      [email or web address]      password [email or web address]  If 7PM-7AM, please contact night-coverage [email or web address] Password [facility] 09/16/2013, 11:26 AM   LOS: 8 days   I have examined the patient, reviewed the chart and modified the above note which I agree with.   Staisha Winiarski,MD [id number] 09/16/2013, 5:35 PM

## 2013-09-16 NOTE — Progress Notes (Signed)
NUTRITION FOLLOW UP  Intervention:   Provide Resource Breeze TID with meals Provide Ensure Complete once daily Encourage PO intake RD to continue to monitor nutrition care plan Recommend monitoring magnesium, potassium, and phosphorus daily for at least 3 days, MD to replete as needed, as pt is at risk for refeeding syndrome given NPO status for past 7 days.   Nutrition Dx:   Inadequate oral intake related to inability to eat as evidenced by NPO; ongoing, diet advanced but, PO intake remains inadequate  Goal:   Pt to meet >/= 90% of their estimated nutrition needs; unmet  Monitor:   Weight, Labs, Intake  Assessment:   58 year old male with s/p Bentall with bioprosthetic AV in august 2011. 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 Admitted 09/08/2013 with encephalopathy and septic picture with imaging showing septic embolli of brain and cultures revelaing MSSA bacteremia. Concern is for MSSA endocarditis but TEE unable to be done due to encephalopathy, and thrombocytopenia. On 09/13/13 develping renal failure (d2 of gent stopped) and worsening encephalopathy.  Pt made NPO starting 2/10. Pt's diet was advanced to Heart Healthy 2/16. Pt ate 15% of dinner last night and even less at breakfast this morning. Pt denies any nausea or vomiting. Per pt's wife at bedside, pt has been drinking a lot. RD provided [employer] which pt drank during visit. Pt's weight has increased 7 lbs since 2/10. Encouraged PO intake and intake of supplements.  Per MD note pt's renal function is declining and pt may need HD.   Labs: high BUN, high creatinine, low calcium, low albumin, very low GFR, high phosphorus, and elevated magnesium.   Height: Ht Readings from Last 1 Encounters:  09/16/13 6\' 1"  (1.854 m)    Weight Status:   Wt Readings from Last 1 Encounters:  09/16/13 218 lb 11.1 oz (99.2 kg)    Re-estimated needs:  Kcal: 2200-2400  Protein:  80-90 g  Fluid: 2.2-2.4L/day  Skin: +1 RUE, LUE, RLE, and LLE edema; intact  Diet Order: Cardiac   Intake/Output Summary (Last 24 hours) at 09/16/13 1018 Last data filed at 09/16/13 0735  Gross per 24 hour  Intake   1510 ml  Output     25 ml  Net   1485 ml    Last BM: 2/17   Labs:   Recent Labs Lab 09/14/13 0445 09/14/13 1252 09/15/13 0341 09/16/13 0335  NA 141  --  139 137  K 2.9* 3.5* 3.5* 4.0  CL 103  --  101 98  CO2 21  --  19 17*  BUN 56*  --  67* 81*  CREATININE 3.89*  --  5.78* 7.60*  CALCIUM 7.4*  --  7.5* 7.0*  MG 2.3  --  2.4 2.6*  PHOS 2.6  --  4.5 6.0*  GLUCOSE 158*  --  131* 127*    CBG (last 3)   Recent Labs  09/16/13 0020 09/16/13 0441 09/16/13 0820  GLUCAP 117* 130* 104*    Scheduled Meds: . antiseptic oral rinse  15 mL Mouth Rinse q12n4p  . chlorhexidine  15 mL Mouth Rinse BID  . famotidine (PEPCID) IV  20 mg Intravenous Q12H  . insulin aspart  0-9 Units Subcutaneous 6 times per day  . levothyroxine  100 mcg Intravenous Daily  . nafcillin IV  2 g Intravenous Q4H  . rifampin (RIFADIN) IVPB  300 mg Intravenous 3 times per day  . sodium  chloride  3 mL Intravenous Q12H    Continuous Infusions: . dextrose 30 mL (09/14/13 1841)    Pryor Ochoa RD, LDN Inpatient Clinical Dietitian Pager: [phone] After Hours Pager: [phone]

## 2013-09-17 ENCOUNTER — Inpatient Hospital Stay (HOSPITAL_COMMUNITY): Payer: Medicaid Other

## 2013-09-17 DIAGNOSIS — I213 ST elevation (STEMI) myocardial infarction of unspecified site: Secondary | ICD-10-CM | POA: Diagnosis not present

## 2013-09-17 LAB — CBC WITH DIFFERENTIAL/PLATELET
Basophils Absolute: 0 10*3/uL (ref 0.0–0.1)
Basophils Absolute: 0 10*3/uL (ref 0.0–0.1)
Basophils Relative: 0 % (ref 0–1)
Basophils Relative: 0 % (ref 0–1)
Eosinophils Absolute: 0.3 10*3/uL (ref 0.0–0.7)
Eosinophils Absolute: 0.2 10*3/uL (ref 0.0–0.7)
Eosinophils Relative: 1 % (ref 0–5)
Eosinophils Relative: 1 % (ref 0–5)
HCT: 40.9 % (ref 39.0–52.0)
HCT: 33.6 % — ABNORMAL LOW (ref 39.0–52.0)
Hemoglobin: 14.8 g/dL (ref 13.0–17.0)
Hemoglobin: 12.3 g/dL — ABNORMAL LOW (ref 13.0–17.0)
Lymphocytes Relative: 2 % — ABNORMAL LOW (ref 12–46)
Lymphocytes Relative: 4 % — ABNORMAL LOW (ref 12–46)
Lymphs Abs: 0.5 10*3/uL — ABNORMAL LOW (ref 0.7–4.0)
Lymphs Abs: 1 10*3/uL (ref 0.7–4.0)
MCH: 30 pg (ref 26.0–34.0)
MCH: 29.8 pg (ref 26.0–34.0)
MCHC: 36.2 g/dL — ABNORMAL HIGH (ref 30.0–36.0)
MCHC: 36.6 g/dL — ABNORMAL HIGH (ref 30.0–36.0)
MCV: 82.8 fL (ref 78.0–100.0)
MCV: 81.4 fL (ref 78.0–100.0)
Monocytes Absolute: 1.3 10*3/uL — ABNORMAL HIGH (ref 0.1–1.0)
Monocytes Absolute: 0.5 10*3/uL (ref 0.1–1.0)
Monocytes Relative: 5 % (ref 3–12)
Monocytes Relative: 2 % — ABNORMAL LOW (ref 3–12)
Neutro Abs: 24.1 10*3/uL — ABNORMAL HIGH (ref 1.7–7.7)
Neutro Abs: 23 10*3/uL — ABNORMAL HIGH (ref 1.7–7.7)
Neutrophils Relative %: 92 % — ABNORMAL HIGH (ref 43–77)
Neutrophils Relative %: 93 % — ABNORMAL HIGH (ref 43–77)
Platelets: 165 10*3/uL (ref 150–400)
Platelets: 145 10*3/uL — ABNORMAL LOW (ref 150–400)
RBC: 4.94 MIL/uL (ref 4.22–5.81)
RBC: 4.13 MIL/uL — ABNORMAL LOW (ref 4.22–5.81)
RDW: 16.4 % — ABNORMAL HIGH (ref 11.5–15.5)
RDW: 15.9 % — ABNORMAL HIGH (ref 11.5–15.5)
WBC: 26.2 10*3/uL — ABNORMAL HIGH (ref 4.0–10.5)
WBC: 24.7 10*3/uL — ABNORMAL HIGH (ref 4.0–10.5)

## 2013-09-17 LAB — COMPREHENSIVE METABOLIC PANEL
ALT: 29 U/L (ref 0–53)
AST: 35 U/L (ref 0–37)
Albumin: 1.6 g/dL — ABNORMAL LOW (ref 3.5–5.2)
Alkaline Phosphatase: 91 U/L (ref 39–117)
BUN: 90 mg/dL — ABNORMAL HIGH (ref 6–23)
CO2: 15 mEq/L — ABNORMAL LOW (ref 19–32)
Calcium: 7.5 mg/dL — ABNORMAL LOW (ref 8.4–10.5)
Chloride: 92 mEq/L — ABNORMAL LOW (ref 96–112)
Creatinine, Ser: 8.89 mg/dL — ABNORMAL HIGH (ref 0.50–1.35)
GFR calc Af Amer: 7 mL/min — ABNORMAL LOW (ref >90)
GFR calc non Af Amer: 6 mL/min — ABNORMAL LOW (ref >90)
Glucose, Bld: 140 mg/dL — ABNORMAL HIGH (ref 70–99)
Potassium: 3.7 mEq/L (ref 3.7–5.3)
Sodium: 131 mEq/L — ABNORMAL LOW (ref 137–147)
Total Bilirubin: 1.7 mg/dL — ABNORMAL HIGH (ref 0.3–1.2)
Total Protein: 6.8 g/dL (ref 6.0–8.3)

## 2013-09-17 LAB — LACTIC ACID, PLASMA: Lactic Acid, Venous: 1 mmol/L (ref 0.5–2.2)

## 2013-09-17 LAB — HEPATITIS B SURFACE ANTIGEN: Hepatitis B Surface Ag: NEGATIVE

## 2013-09-17 LAB — MAGNESIUM: Magnesium: 2.8 mg/dL — ABNORMAL HIGH (ref 1.5–2.5)

## 2013-09-17 LAB — TROPONIN I
Troponin I: 0.38 ng/mL (ref <0.30)
Troponin I: 0.36 ng/mL (ref <0.30)

## 2013-09-17 LAB — GLUCOSE, CAPILLARY
Glucose-Capillary: 109 mg/dL — ABNORMAL HIGH (ref 70–99)
Glucose-Capillary: 130 mg/dL — ABNORMAL HIGH (ref 70–99)
Glucose-Capillary: 136 mg/dL — ABNORMAL HIGH (ref 70–99)
Glucose-Capillary: 92 mg/dL (ref 70–99)
Glucose-Capillary: 93 mg/dL (ref 70–99)
Glucose-Capillary: 95 mg/dL (ref 70–99)

## 2013-09-17 LAB — HEPATITIS B SURFACE ANTIBODY,QUALITATIVE: Hep B S Ab: NEGATIVE

## 2013-09-17 LAB — PHOSPHORUS: Phosphorus: 7.4 mg/dL — ABNORMAL HIGH (ref 2.3–4.6)

## 2013-09-17 LAB — PROTIME-INR
INR: 1.23 (ref 0.00–1.49)
Prothrombin Time: 15.2 seconds (ref 11.6–15.2)

## 2013-09-17 LAB — SODIUM, URINE, RANDOM: Sodium, Ur: 49 mEq/L

## 2013-09-17 LAB — HEPATITIS B CORE ANTIBODY, TOTAL: Hep B Core Total Ab: NONREACTIVE

## 2013-09-17 LAB — CORTISOL: Cortisol, Plasma: 20.2 ug/dL

## 2013-09-17 MED ORDER — LIDOCAINE HCL (PF) 1 % IJ SOLN: 5.0000 mL | INTRAMUSCULAR | Status: DC | PRN

## 2013-09-17 MED ORDER — ALTEPLASE 2 MG IJ SOLR: 2.0000 mg | Freq: Once | INTRAMUSCULAR | Status: DC | PRN

## 2013-09-17 MED ORDER — NEPRO/CARBSTEADY PO LIQD: 237.0000 mL | ORAL | Status: DC | PRN

## 2013-09-17 MED ORDER — LIDOCAINE-PRILOCAINE 2.5-2.5 % EX CREA: 1.0000 "application " | TOPICAL_CREAM | CUTANEOUS | Status: DC | PRN

## 2013-09-17 MED ORDER — SODIUM CHLORIDE 0.9 % IV SOLN: 100.0000 mL | INTRAVENOUS | Status: DC | PRN

## 2013-09-17 MED ORDER — HEPARIN SODIUM (PORCINE) 1000 UNIT/ML DIALYSIS: 1000.0000 [IU] | INTRAMUSCULAR | Status: DC | PRN

## 2013-09-17 MED ORDER — ASPIRIN 81 MG PO CHEW
CHEWABLE_TABLET | ORAL | Status: AC
  Filled 2013-09-17: qty 1

## 2013-09-17 MED ORDER — ASPIRIN 81 MG PO CHEW
324.0000 mg | CHEWABLE_TABLET | Freq: Once | ORAL | Status: AC
  Administered 2013-09-17: 324 mg via ORAL

## 2013-09-17 MED ORDER — HEPARIN SODIUM (PORCINE) 1000 UNIT/ML IJ SOLN
1000.0000 [IU] | INTRAMUSCULAR | Status: DC | PRN
  Administered 2013-09-17 (×2): 1.4 [IU] via INTRAVENOUS
  Filled 2013-09-17 (×4): qty 1

## 2013-09-17 MED ORDER — PENTAFLUOROPROP-TETRAFLUOROETH EX AERO: 1.0000 "application " | INHALATION_SPRAY | CUTANEOUS | Status: DC | PRN

## 2013-09-17 NOTE — Progress Notes (Signed)
CRITICAL VALUE ALERT  Critical value received: TROP  Date of notification:  09/17/2013  Time of notification:  [id number]  Critical value read back:yes  Nurse who received alert:  abarlow  MD notified (1st page):  mcclean,tracy  Time of first page:  1330  MD notified (2nd page):  Time of second page:  Responding MD:  mcclean  Time MD responded:  [id number]

## 2013-09-17 NOTE — Progress Notes (Signed)
Shift event: RN paged this NP secondary to PICC line "being out" a few inches. CXR done to confirm placement. CXR showed no PICC line. Order placed to pull present PICC line and to placed new PICC this am due to pt's need for antibiotics and poor venous access.  Clance Boll, NP [facility]

## 2013-09-17 NOTE — Progress Notes (Signed)
Shift event: RN paged this NP due to pt having an acute episode of diaphoresis and SOB. Tele changes present. 12 lead EKG done and reviewed by this NP and Dr. Sheran Luz of [facility] and differential was possible STEMI. NP to bedside after reviewing EKG with doc. ASA 81mg  chewable x 4 given. O2 applied at 2L [place] (pt was on RA). Pt has encephalopathy so is a ? reliable historian. Cardio fellow called urgently secondary to EKG results.  S: Pt denies chest pain. Says his abd hurts and left upper arm "sometimes". No acute pain at present. Says he got sweaty and SOB. Says he feels no better and no worse than he did on admission, but doesn't feel he is making much progress.  O: VS: HR 113, reg, sinus tach with ST elevation on tele. RR 22-28. BP 84/60 up to 110/70 with 250cc bolus. O2 sat 94% on 2L O2 per [place]. Afebrile during night. Chronically ill appearing obese WM in NAD. Skin is warm and wet. Excoriations to chest (looks like he may pick at his skin). He is alert but not oriented. He is unaware he is in a hospital. Doesn't know the day or date. Recognizes his wife. S1S2, RRR, sinus tach. EKG with ST elevation. No increased WOB. Abd distended, NT on palpation. MOE x 4. Speech is clear. No focal neuro deficit noted except for his confusion.  A/P: Very sick man with MSSA sepsis, septic emboli, hx valve replacement in [id number], diastolic heart failure with last EF 60% in 04/2013, severe LVH, HTN, MSSA UTI, renal failure with pending dialysis, and possible endocarditis and infected valve vs septic emboli in heart. Since admission, his renal failure has worsened leading to possible HD to start today. This has been postponed due to pt's thrombocytopenia. TEE has been postponed because of his critical illness, renal failure, and thrombocytopenia. Pt has been routinely followed by cardiology, nephrology, ID, and critical care since admission.  I emergently called the cardio fellow, Dr. Jules Husbands, who came to the  bedside, reviewed EKG, tests, etc and spoke to pt and his wife at bedside. Dr. Claiborne Billings also spoke to his superior. Please see his note in chart. Cardio knows to round on pt today.  1. ? STEMI-differentials per cardio note. Troponins pending. Pt received ASA, O2. Never c/o chest pain. Thoughts are that pt is so clinically ill that all interventions other than medical are risky. No Heparin recommended by cardio at this time. Will repeat EKG later in the morning. Transfer pt to 2900 ICU. I called PCCM who are already involved in care. They may want/need to assume care of pt today. I will report all of this to the 0700 oncoming attending.  2. Sepsis-cont tx of abx. ID is following as well.  3. AKI/ATN-for possible HD today? nephro following.  4. Venous access-poor, RN reported that PICC had pulled out some tonight. CXR done did not see a PICC. Later, IV RN confirmed that catheter is a midline cath, not a PICC, and IV team will come by this am to eval and treat. For now, pt does have a small PIV.  Wife is realistic and aware that her husband is very ill and has a poor prognosis.  Clance Boll, NP [facility]

## 2013-09-17 NOTE — Progress Notes (Signed)
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 ECG reviewed and has borderline inferior STEMI criteria but clinical picture more complicated without SOB or CP. He has some encephalopathy but can fully interact with me, identifies me as a physician and knows his wife but does not know the location. He tells me his feet are cold and he feels sweaty but denies any chest pain or SOB. Vitals reviewed; BP 84/66  Pulse 135  Temp(Src) 98.5 F (36.9 C) (Oral)  Resp 21  Ht 6\' 1"  (1.854 m)  Wt 100.8 kg (222 lb 3.6 oz)  BMI 29.33 kg/m2  SpO2 96%WBC increased to 26, plt have been very low but 165 today. He has planned initiation of dialysis but this has been delayed because of thrombocytopenia. My review of the ECG with inferior ST elevation. Differential is ACS/STEMI, valve thrombosis, septic emboli, rate-related demand ischemia among others. I spoke with interventional cardiology on call, Dr. Claiborne Billings, about the patient and given complex, high risk medical cause (infective endocarditis) with thrombocytopenia (now 165 but likely acute phase reactant), there is no durable option (should a stent be necessary, don't know that dual-antiplatelet therapy is option). Bradley [name] has a very guarded prognosis. At this point, will transfer to CCU, reassess, treat symptoms, he's received aspirin. No anticoagulation at this point in time. Will discuss the case with oncoming Cardiology team. I have discussed the findings with Bradley [name] and his wife at bedside.   Jules Husbands, MD

## 2013-09-17 NOTE — Progress Notes (Signed)
PULMONARY  / CRITICAL CARE MEDICINE  Name: Bradley [name] MRN: [id number] DOB: 12/19/1955 PCP [name], MD ADMISSION DATE:  09/08/2013  LOS 9 days   CONSULTATION DATE:  09/13/13  REFERRING MD :  DR Clydene Laming of [facility] PRIMARY SERVICE: [facility]  CHIEF COMPLAINT:  Encephalopathy. Intubation risk  BRIEF PATIENT DESCRIPTION:  58 year old male with s/p Bentall with bioprosthetic AV in august 2011. 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 Admitted 09/08/2013 with encephalopathy and septic picture with imaging showing septic embolli of brain and cultures revelaing MSSA bacteremia. Concern is for MSSA endocarditis but TEE unable to be done due to encephalopathy, and thrombocytopenia (Heme consult Dr Jana Hakim feels this is c/w infection). On 09/13/13 develping renal failure (d2 of gent stopped) and worsening encephlopathy and PCCM consulted.    SIGNIFICANT EVENTS / STUDIES:  2/09 - admit 2/10 - ID, Heme and Cards consult. ECHO ef 45%. No clear vegetations 2/14 - PCCM consult 2/15 - Junctional rhythm with precedex, mental status improved.  Transferred to [facility] for possible HD if renal fx deteriorates further 2/18 transferred to ICU for diaphoresis, ST changes and cofusion.   LINES / TUBES: Left Midline PICC 09/13/13>> Foley 2/13 >>>  CULTURES: 2/09 - Blood - Staph Aureus MSSA 2/09 - blood - Staph Aureus MSSA 2/10 - MRSA PCR - negative 2/11 - blood >>> negative 2/16 C.diff >>> negative  ANTIBIOTICS: Zosyn 2./9 - 2/10 Cefepime 2/10 >>2/10 Vanc 2/9 - 2/12 Gent 2/12 >>2/14 (rise in creat) ---------------------------------- Nafcillin 2/10 >> Rifampin 2/15 >>>  SUBJECTIVE: Transferred to ICU for ST changes and ? STEMI   VITAL SIGNS: Filed Vitals:   09/17/13 0425 09/17/13 0600 09/17/13 0700 09/17/13 0801  BP: 118/76 84/66 105/84 99/76  Pulse: 110 135 111 106  Temp: 98.5 F (36.9 C)   97.6 F (36.4 C)  TempSrc: Oral   Oral  Resp: 27  21 26 22   Height:      Weight: 100.8 kg (222 lb 3.6 oz)     SpO2: 97% 96% 98% 98%   INTAKE / OUTPUT: I/O last 3 completed shifts: In: 1560 [I.V.:760; IV Piggyback:800] Out: 25 [Urine:25]  PHYSICAL EXAMINATION: General:  Pleasant well built male, in NAD. Neuro: A&O to person and place, not time.  Bizarre affect . HEENT:  Neck supple. Bearded male Cardiovascular:  RRR, 3/6 SEM  Lungs:  CTA bilaterally, no W/R/R. Abdomen:  BS x 4, Distended with decreased bs.  Musculoskeletal:  No cyanosis, No clubbing. No edema. No janeway lesions/splinter hemorrhages. Skin:  Intact anteriorly  LABS: PULMONARY  Recent Labs Lab 09/12/13 1650 09/13/13 0755 09/13/13 1315 09/13/13 2042 09/14/13 0745  PHART 7.543* 7.529* 7.470* 7.543* 7.496*  PCO2ART 28.7* 28.9* 33.6* 27.8* 28.7*  PO2ART 63.1* 72.4* 72.4* 67.3* 65.2*  HCO3 24.6* 24.0 24.2* 23.7 21.9  TCO2 21.5 20.8 21.2 20.5 19.0  O2SAT 94.6 96.2 95.9 95.5 94.8   CBC  Recent Labs Lab 09/15/13 0341 09/16/13 0335 09/17/13 0524  HGB 13.5 12.9* 14.8  HCT 37.4* 35.0* 40.9  WBC 25.0* 21.8* 26.2*  PLT 81* 92* 165   COAGULATION  Recent Labs Lab 09/13/13 0603 09/14/13 0445 09/15/13 0341 09/16/13 0335 09/17/13 0343  INR 1.34 1.37 1.17 1.41 1.23   CARDIAC    Recent Labs Lab 09/10/13 1500 09/10/13 2315 09/12/13 0913 09/12/13 1419 09/12/13 2010  TROPONINI 2.05* 1.78* 1.52* 1.54* 1.39*    Recent Labs Lab 09/14/13 0445  PROBNP >70000.0*  CHEMISTRY  Recent Labs Lab 09/12/13 1730  09/13/13 0603  09/14/13 0040 09/14/13 0445  09/15/13 0341 09/16/13 0335 09/17/13 0524  NA 148*  < > 147  < > 141 141  --  139 137 131*  K 2.8*  --  3.1*  < >  --  2.9*  < > 3.5* 4.0 3.7  CL 112  --  110  --   --  103  --  101 98 92*  CO2 24  --  24  --   --  21  --  19 17* 15*  GLUCOSE 255*  --  161*  --   --  158*  --  131* 127* 140*  BUN 34*  --  39*  --   --  56*  --  67* 81* 90*  CREATININE 1.31  --  1.89*  --   --  3.89*  --   5.78* 7.60* 8.89*  CALCIUM 8.0*  --  7.8*  --   --  7.4*  --  7.5* 7.0* 7.5*  MG 2.4  --   --   --   --  2.3  --  2.4 2.6* 2.8*  PHOS  --   --   --   --   --  2.6  --  4.5 6.0* 7.4*  < > = values in this interval not displayed. Estimated Creatinine Clearance: 11.6 ml/min (by C-G formula based on Cr of 8.89).  LIVER  Recent Labs Lab 09/13/13 0603 09/14/13 0445 09/15/13 0341 09/16/13 0335 09/17/13 0343 09/17/13 0524  AST 26 24 23 26   --  35  ALT 29 26 24 22   --  29  ALKPHOS 85 81 84 86  --  91  BILITOT 1.2 1.5* 2.2* 1.7*  --  1.7*  PROT 5.4* 5.4* 5.7* 5.7*  --  6.8  ALBUMIN 1.8* 1.7* 1.5* 1.5*  --  1.6*  INR 1.34 1.37 1.17 1.41 1.23  --    INFECTIOUS  Recent Labs Lab 09/10/13 1500 09/13/13 1241 09/14/13 0445 09/15/13 0341  LATICACIDVEN 1.3 1.2  --   --   PROCALCITON  --  1.07 1.50 2.42   ENDOCRINE CBG (last 3)   Recent Labs  09/16/13 1945 09/17/13 0014 09/17/13 0428  GLUCAP 173* 109* 130*    IMAGING x48h  US Renal  09/15/2013   CLINICAL DATA:  Acute renal failure.  EXAM: RENAL/URINARY TRACT ULTRASOUND COMPLETE  COMPARISON:  CT abdomen and pelvis 09/06/2013.  FINDINGS: Right Kidney:  Length: Approximately 12.7 cm. No hydronephrosis. Well-preserved cortex. No shadowing calculi. Normal parenchymal echotexture without focal abnormalities.  Left Kidney:  Length: Approximately 12.5 cm. No hydronephrosis. Well-preserved cortex. No shadowing calculi. Normal parenchymal echotexture without focal abnormalities.  Bladder:  Decompressed by Foley catheter.  IMPRESSION: Normal kidneys.  Specifically, no evidence of hydronephrosis.   Electronically Signed   By: Evangeline Dakin M.D.   On: 09/15/2013 10:55   Dg Chest Port 1 View  09/17/2013   CLINICAL DATA:  Confirm PICC line placement.  EXAM: PORTABLE CHEST - 1 VIEW  COMPARISON:  09/13/2013  FINDINGS: No PICC line is visualized within the chest or shoulder regions bilaterally.  There is cardiomegaly. Prior median sternotomy and  valve replacement. Mild vascular congestion. Left lower lobe airspace opacity again noted, unchanged. No visible effusions.  IMPRESSION: No visible PICC line.  Stable cardiomegaly and vascular congestion.  Stable left lower lobe atelectasis or consolidation/pneumonia.   Electronically Signed   By: Rolm Baptise M.D.  On: 09/17/2013 04:08   ASSESSMENT / PLAN:  PULMONARY A:  At Risk Acute Respiratory Failure - potential for intubation due to encephalopathy. P:   - Monitor resp status closely, high risk for intubation given mental status. - Pulmonary hygiene. - Oxygen to support sats > 93%.  CARDIOVASCULAR A:  NSTEMI /STEMI Acute Systolic CHF - BNP [id number] Hx BENTALL (2011) - [name]  Cardiology wished patient moved to the ICU out of concern for rhythm changes.  Now appears stable. P:  - Appreciate Cardiology input. Follow trop I - Tele monitoring - Hold TEE until more alert & awake, acute issues resolved.  Note, may need intubation for airway protection during procedure. - Given improvement in rhythm if remains stable will consider transfer back to SDU if tolerates first HD.  RENAL A:   Acute Kidney Injury - on gent >>> dc'd 2/14. Cr continues to rise. Hypokalemia - resolved. P:   - Gent stopped - Place HD catheter today and begin dialysis.  GASTROINTESTINAL A:   At risk aspiration P:   - Begin diet. - Severely malnutritioned, will need help from nutrition once more stable.  HEMATOLOGIC A:   Thrombocytopenia - in setting of sepsis, improving. P:  - Monitor  INFECTIOUS A:   Leukocytosis - in setting of sepsis. MSSA bacteremia - source suspected bioprosthetic aortic valve, pending TEE for further evaluation. P:   - Per ID  ENDOCRINE A:   Hypothyroidism    P:   - Continue synthroid.  NEUROLOGIC A:   Encephalopathic likely uremia related P:   - Supportive care. - Minimize sedating medications as able. - If continues to deteriorate will likely need  intubation.  Richardson Landry Minor ACNP Maryanna Shape PCCM Pager [phone] till 3 pm If no answer page [phone] 09/17/2013, 8:23 AM  Patient transferred to the ICU for rhythm changes overnight, seems more stable now.  Concern now is airway protection from encephalopathy.  If improves after dialysis (suspect uremia is cause) then will transfer back to SDU and to [facility] with PCCM staying as consult.  CC time 35 min.  Patient seen and examined, agree with above note.  I dictated the care and orders written for this patient under my direction.  Rush Farmer, MD [phone]

## 2013-09-17 NOTE — Progress Notes (Signed)
[facility] Cardiology Consult Note    Chief Complaint: MSSA bacteremia s/p AVR, EKG changes (?STEMI), NSTEMI (type 2), increasing PR interval  History of Present Illness:  58 y.o with significant PMH brought in by family 2/8 to ED dx'ed with influenza (with sx's n/v/ab pain/bloating/diarrhea, myalgias) and tx'ed with Tamiflu and sent home.  Then he was brought in to [place] 2/9 due to acute encephalopathy (since Friday prior to admission), generalized weakness/fatigue x 3 weeks. He has had a complicated admission with NSTEMI, severe sepsis (lactic acid 4.16 09/08/13), MSSA bacteremia, MSSA UTI, acute encephalopathy thought 2/2 sepsis + septic emboli/hemorrhage with c/w encephalitis, acute vs chronic diastolic heart failure, acute renal failure/ATN, concern for HCAP, abnormal thyroid studies, hypotension and thrombocytopenia.  He was transferred to [facility] from [facility] on 2/16 d/t worsening renal failure in preparation for HD.    09/17/13 ~530 patient was diaphoretic, sob confused. EKG with borderline STEMI criteria and patient was transferred from SDU to [facility] ICU for further care.    Subjective: This am ~530 am pt was sweaty, sob (now resolved). Denies chest pain and admits to feeling tired.    Meds: Medications Prior to Admission  Medication Sig Dispense Refill  . acetaminophen (TYLENOL) 500 MG tablet Take 500 mg by mouth every 6 (six) hours as needed for mild pain or fever.      Marland Kitchen aspirin 325 MG tablet Take 325 mg by mouth daily.        Marland Kitchen ibuprofen (ADVIL,MOTRIN) 200 MG tablet Take 200 mg by mouth every 6 (six) hours as needed for fever.      . levothyroxine (SYNTHROID, LEVOTHROID) 175 MCG tablet Take 175 mcg by mouth daily.      Marland Kitchen lisinopril (PRINIVIL,ZESTRIL) 10 MG tablet Take 1 tablet (10 mg total) by mouth daily.  30 tablet  11  . metoprolol (LOPRESSOR) 50 MG tablet Take 25 mg by mouth 2 (two) times daily.      . multivitamin (THERAGRAN) per tablet Take 1 tablet by mouth daily.         Marland Kitchen oseltamivir (TAMIFLU) 75 MG capsule Take 1 capsule (75 mg total) by mouth every 12 (twelve) hours.  10 capsule  0  . sertraline (ZOLOFT) 50 MG tablet Take 50 mg by mouth daily.       Current Facility-Administered Medications  Medication Dose Route Frequency Provider Last Rate Last Dose  . acetaminophen (TYLENOL) suppository 650 mg  650 mg Rectal Q6H PRN Berle Mull, MD   650 mg at 09/14/13 0140  . antiseptic oral rinse (BIOTENE) solution 15 mL  15 mL Mouth Rinse q12n4p Allie Bossier, MD   15 mL at 09/16/13 1603  . aspirin 81 MG chewable tablet           . chlorhexidine (PERIDEX) 0.12 % solution 15 mL  15 mL Mouth Rinse BID Allie Bossier, MD   15 mL at 09/16/13 1939  . dextrose 5 % solution   Intravenous Continuous Sol Blazing, MD 30 mL/hr at 09/17/13 0219    . famotidine (PEPCID) IVPB 20 mg  20 mg Intravenous Q12H Debbe Odea, MD   20 mg at 09/16/13 2209  . feeding supplement (ENSURE COMPLETE) (ENSURE COMPLETE) liquid 237 mL  237 mL Oral Q24H Baird Lyons, RD   237 mL at 09/16/13 1332  . feeding supplement (RESOURCE BREEZE) (RESOURCE BREEZE) liquid 1 Container  1 Container Oral TID WC Baird Lyons, RD      .  haloperidol lactate (HALDOL) injection 4 mg  4 mg Intravenous Q6H PRN Allie Bossier, MD   4 mg at 09/16/13 1338  . heparin injection 1,000 Units  1,000 Units Intravenous PRN Rush Farmer, MD   1.4 Units at 09/17/13 1026  . insulin aspart (novoLOG) injection 0-9 Units  0-9 Units Subcutaneous 6 times per day Modena Jansky, MD   2 Units at 09/16/13 1956  . levothyroxine (SYNTHROID, LEVOTHROID) injection 100 mcg  100 mcg Intravenous Daily Allie Bossier, MD   100 mcg at 09/16/13 1046  . liver oil-zinc oxide (DESITIN) 40 % ointment   Topical PRN Gardiner Barefoot, NP      . metoprolol (LOPRESSOR) injection 2.5 mg  2.5 mg Intravenous Q4H PRN Berle Mull, MD   2.5 mg at 09/11/13 1706  . morphine 2 MG/ML injection 1 mg  1 mg Intravenous Q4H PRN Allie Bossier, MD   1 mg  at 09/15/13 1906  . nafcillin 2 g in dextrose 5 % 50 mL IVPB  2 g Intravenous Q4H Carlyle Basques, MD   2 g at 09/17/13 0650  . ondansetron (ZOFRAN) injection 4 mg  4 mg Intravenous Q6H PRN Berle Mull, MD   4 mg at 09/16/13 1943  . rifampin (RIFADIN) 300 mg in sodium chloride 0.9 % 100 mL IVPB  300 mg Intravenous 3 times per day Michel Bickers, MD   300 mg at 09/17/13 0609  . sodium chloride 0.9 % injection 3 mL  3 mL Intravenous Q12H Berle Mull, MD      . zolpidem (AMBIEN) tablet 5 mg  5 mg Oral QHS PRN Debbe Odea, MD   5 mg at 09/16/13 2125    Allergies: Allergies as of 09/08/2013  . (No Known Allergies)   Past Medical History  Diagnosis Date  . Anxiety   . Depression   . History of thyroid cancer   . Hypertension   . Hypothyroidism, postsurgical   . Aortic stenosis     s/p [place] with bioprosthetic AVR 02/2010; Last echo (9/11): Moderate LVH, EF 45-50%, AVR functioning appropriately, aortic valve mean gradient 21, diastolic dysfunction. Chest MRA (2/13): Mild to moderate dilatation at the sinus of Valsalva at 4.1 cm, mild dilatation ascending aorta distal to the tube graft at 3.9 cm, moderate dilatation of the innominate artery a 2.1 cm;    . Hx of cardiac cath     a. LHC in 02/2010: normal cors  . Hx of echocardiogram 2014    Echo (10/14): Severe LVH, EF 60-65%, normal wall motion, grade 1 diastolic dysfunction, AVR functioning normally, mild aortic stenosis (mean 19), moderately dilated aorta, mild LAE, mild RVE   Past Surgical History  Procedure Laterality Date  . Cardiac catheterization  03/02/2010    NORMAL CORONARY ARTERY  . Sternotomy      REDO  . Transthoracic echocardiogram  03/2010    SHOWED MILD REDUCTION OF LV FUNCTION  . Thyroidectomy    . Rotator cuff repair  2012    Right   Family History  Problem Relation Age of Onset  . Heart disease Father    History   Social History  . Marital Status: Married    Spouse Name: N/A    Number of Children: N/A  . Years  of Education: N/A   Occupational History  . Not on file.   Social History Main Topics  . Smoking status: Never Smoker   . Smokeless tobacco: Never Used  . Alcohol Use:  No  . Drug Use: No  . Sexual Activity: No   Other Topics Concern  . Not on file   Social History Narrative  . No narrative on file    Review of Systems: General: +tired, resolved diaphoresiss  HEENT: +left neck HD catheter  Cardiac: denies chest pain  Pulm: resolved sob Ext: denies lower extremity edema  Neuro: confusion improved     Physical Exam: Blood pressure 99/76, pulse 106, temperature 97.6 F (36.4 C), temperature source Oral, resp. rate 22, height 6\' 1"  (1.854 m), weight 222 lb 3.6 oz (100.8 kg), SpO2 98.00%. Vitals reviewed. General: resting in bed, NAD HEENT: Crucible/at, no scleral icterus, left neck HD catheter recently in place with blood on pillow  Cardiac: slightly tachycardic, +systolic murmur  Pulm: clear to auscultation bilaterally anteriorly  Abd/GU: firm, nontender, +distended, BS present, rectal tube intact  Ext: warm and well perfused, no pedal edema Neuro: alert and oriented X3, cranial nerves II-XII grossly intact, moving all 4 extremities Skin: no evidence of splinter hemorrhages fingernails b/l  Lab results: Basic Metabolic Panel:  Recent Labs  09/16/13 0335 09/17/13 0524  NA 137 131*  K 4.0 3.7  CL 98 92*  CO2 17* 15*  GLUCOSE 127* 140*  BUN 81* 90*  CREATININE 7.60* 8.89*  CALCIUM 7.0* 7.5*  MG 2.6* 2.8*  PHOS 6.0* 7.4*   Liver Function Tests:  Recent Labs  09/16/13 0335 09/17/13 0524  AST 26 35  ALT 22 29  ALKPHOS 86 91  BILITOT 1.7* 1.7*  PROT 5.7* 6.8  ALBUMIN 1.5* 1.6*   CBC:  Recent Labs  09/16/13 0335 09/17/13 0524  WBC 21.8* 26.2*  NEUTROABS 19.8* 24.1*  HGB 12.9* 14.8  HCT 35.0* 40.9  MCV 82.2 82.8  PLT 92* 165   Cardiac Enzymes: No results found for this basename: CKTOTAL, CKMB, CKMBINDEX, TROPONINI,  in the last 72  hours  CBG:  Recent Labs  09/16/13 1259 09/16/13 1724 09/16/13 1945 09/17/13 0014 09/17/13 0428 09/17/13 0803  GLUCAP 176* 162* 173* 109* 130* 136*   Thyroid Function Tests:  Recent Labs  09/14/13 1252  TSH 10.174*  FREET4 0.84   Coagulation:  Recent Labs  09/16/13 0335 09/17/13 0343  LABPROT 16.9* 15.2  INR 1.41 1.23   Urine Drug Screen: Drugs of Abuse     Component Value Date/Time   LABOPIA NONE DETECTED 09/06/2013 2237   COCAINSCRNUR NONE DETECTED 09/06/2013 2237   LABBENZ NONE DETECTED 09/06/2013 2237   AMPHETMU NONE DETECTED 09/06/2013 2237   THCU POSITIVE* 09/06/2013 2237   LABBARB NONE DETECTED 09/06/2013 2237   Urinalysis:  Ref. Range 09/06/2013 22:37 09/08/2013 22:03  Color, Urine Latest Range: YELLOW  AMBER (A) YELLOW  APPearance Latest Range: CLEAR  CLOUDY (A) CLOUDY (A)  Specific Gravity, Urine Latest Range: 1.005-1.030  1.043 (H) 1.021  pH Latest Range: 5.0-8.0  5.5 6.0  Glucose Latest Range: NEGATIVE mg/dL NEGATIVE 100 (A)  Bilirubin Urine Latest Range: NEGATIVE  NEGATIVE NEGATIVE  Ketones, ur Latest Range: NEGATIVE mg/dL 15 (A) NEGATIVE  Protein Latest Range: NEGATIVE mg/dL 30 (A) 100 (A)  Urobilinogen, UA Latest Range: 0.0-1.0 mg/dL 0.2 1.0  Nitrite Latest Range: NEGATIVE  NEGATIVE NEGATIVE  Leukocytes, UA Latest Range: NEGATIVE  NEGATIVE SMALL (A)  Hgb urine dipstick Latest Range: NEGATIVE  SMALL (A) LARGE (A)  Urine-Other No range found MUCOUS PRESENT   WBC, UA Latest Range: <3 WBC/hpf 0-2 7-10  RBC / HPF Latest Range: <3 RBC/hpf 0-2 21-50  Squamous Epithelial /  LPF Latest Range: RARE  FEW (A)   Bacteria, UA Latest Range: RARE  FEW (A) MANY (A)  Casts Latest Range: NEGATIVE   GRANULAR CAST (A)    Misc. Labs: Lactic acid  Troponin x3  CBC  Imaging results:  Dg Chest Port 1 View  09/17/2013   CLINICAL DATA:  Confirm PICC line placement.  EXAM: PORTABLE CHEST - 1 VIEW  COMPARISON:  09/13/2013  FINDINGS: No PICC line is visualized within the chest  or shoulder regions bilaterally.  There is cardiomegaly. Prior median sternotomy and valve replacement. Mild vascular congestion. Left lower lobe airspace opacity again noted, unchanged. No visible effusions.  IMPRESSION: No visible PICC line.  Stable cardiomegaly and vascular congestion.  Stable left lower lobe atelectasis or consolidation/pneumonia.   Electronically Signed   By: Rolm Baptise M.D.   On: 09/17/2013 04:08   2/13 CT head  IMPRESSION:  1. Limited study due to motion. No significant interval change in  subacute left parietal infarct with small amount of associated  hemorrhage. Additional multi focal infarcts involving the  supratentorial brain and right cerebellar hemisphere are not  significantly changed as well relative to recent MRI from  09/09/2013. No definite new intracranial infarct identified. No new  intracranial hemorrhage.  09/09/13 CT head  IMPRESSION:  1. Increasing edema around linear high attenuation in the left  parietal lobe. Followup MRI with and without contrast is recommended  for further assessment. The patient presents with an infectious  syndrome and septic emboli are in the differential considerations.  Infarct with a small amount of hemorrhage or cortical laminar  necrosis as well as developmental venous anomaly remain in the  differential considerations. MRI should be useful in further  characterization.  2. Dr. Leonel Ramsay called discuss the case at 0230 hr on the day of  examination.   MRI brain 2/10  IMPRESSION:  Exam is motion degraded.  Multiple bilateral supra tentorial and right cerebellar regions of  restricted motion some of which are partially hemorrhagic with  largest area left parietal lobe. In the present clinical setting,  these findings are consistent with acute partially hemorrhagic  infarcts and possibly related to septic emboli.  These results were called by telephone at the time of interpretation  on 09/09/2013 at 11:54 AM to  Dr. Algis Liming , who verbally acknowledged  these results.  04/2013 echo  Study Conclusions  - Left ventricle: The cavity size was normal. Wall thickness was increased in a pattern of severe LVH. Systolic function was normal. The estimated ejection fraction was in the range of 60% to 65%. Wall motion was normal; there were no regional wall motion abnormalities. Doppler parameters are consistent with abnormal left ventricular relaxation (grade 1 diastolic dysfunction). - Aortic valve: A bioprosthesis was present and functioning normally. There was mild stenosis. - Aorta: The aorta was moderately dilated. - Left atrium: The atrium was mildly dilated. - Right ventricle: The cavity size was mildly dilated.   09/09/13 echo Study Conclusions  - Left ventricle: septal and apical hypokinesis The cavity size was mildly dilated. Wall thickness was increased in a pattern of moderate LVH. Systolic function was mildly reduced. The estimated ejection fraction was in the range of 45% to 50%. - Aortic valve: Apparent tissue AVR. No abscess or vegetation seen. No AR or perivalvular leak and normal systolic gradients Suggest TEE if clinical suspicion for SBE high - Mitral valve: Mild regurgitation. - Left atrium: The atrium was mildly dilated.   Other results: EKG: 2/18  borderline inferior ST elevation 3, avf, LBBB  Assessment & Plan by Problem: 58 y.o male with complicated medication history.  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 This admission since 2/9 has been complicated with multiple medical issues (below).  He was transferred to [facility] from [facility] on 2/16 in case he needed HD for worsening renal function.  He was transferred from SDU to  cardiac ICU 2/18 due to EKG changes, diaphoresis and sob.    #NSTEMI (non-ST elevated myocardial infarction) type 2  -likely 2/2 demand ischemia    Ref. Range 09/10/2013 07:53 09/10/2013 15:00 09/10/2013 23:15 09/12/2013 09:13 09/12/2013 14:19 09/12/2013 17:30 09/12/2013 20:10  Troponin I Latest Range: <0.30 ng/mL 2.26 (HH) 2.05 (HH) 1.78 (HH) 1.52 (HH) 1.54 (HH)  1.39 (HH)   -per notes EKG 2/18 with borderline inferior STEMI criteria with Ddx ACS/STEMI, valve thrombosis, septic emboli, demand ischemia. He was given Aspirin 324 mg x 1  -2/15 junctional rhythm on Precedex 2/15 and concern for increasing PR interval. -trending trops x 3 today  -EKGs have been variable this admission, ST elevation could be false representation due to LBBB.  Patient denies chest pain.  repeat EKG later this am    #MSSA bacteremia with concern for infective endocarditis s/p AVR -+cultures 09/08/13; repeat blood cultures NTD 2/11  -echo repeat (see above)  -ID, Cardiology following  -initially on Vanc 2/10-2/11, [name] (2/10-2/12), Cefepime (1 dose on 2/10)>Nafcillin on 2/10, Zosyn (1 dose given 2/9), Rifampin (dose on 2/15).  -ID rec tx x 6 weeks -Now pt only on Nafcillin  -Had TTE (no vegetation) will need TEE at some point in the future   #Septic emboli/hemmorhage with encephalitis  -noted on CT head  -Neuro, ID following  -at some point this admission associated with left hemiparesis   #Acute vs chronic diastolic heart failure -concerned for this admission    Ref. Range 09/10/2013 07:53 09/10/2013 15:00 09/10/2013 23:15 09/12/2013 09:13 09/12/2013 14:19 09/12/2013 17:30 09/12/2013 20:10 09/14/2013 04:45  Pro B Natriuretic peptide (BNP) Latest Range: 0.0-100.0 pg/mL 37349.0 (H)       >70000.0 (H)   -strict i/o, daily weights  -cards following   #Acute renal failure -Could be ATN 2/2 bacteremia/immune complex thought less likely d/t medications. Gentamycin stopped.  -AG 24 today. Cre 8.89 (BL <1) -Nephro consulted  and following  -renal US wnl, renal artery duplex pending  -HD today s/p left HD catheter  -strict i/o, daily weights, trend BMET   #Sepsis  -on Abx   #MSSA UTI  -on Abx   #Concern for HCAP (healthcare-associated pneumonia) -noted on CXR 1 view 2/9 left lung c/w early pneumonia with h/o small left pleural effusion  -On Abx  -Hospitalist, CCM following   #Acute Encephalopathy, improved  -Initially thought 2/2 severe sepsis and brain emboli presented 2/10.  CT head with increasing edema in left parietal lobe.   -CTs and MRI this admission (see above)  -Neurology consulted   -ID consulted   #Acute respiratory alkalosis -pt was on Precedex gtt with improvement   #HYPOTHYROIDISM s/p thyroid cancer and thyroidectomy  -abnormal thyroid studies   Ref. Range 09/14/2013 12:52  TSH Latest Range: 0.350-4.500 uIU/mL 10.174 (H)  Free T4 Latest Range: 0.80-1.80 ng/dL 0.84  T3, Total Latest Range: 80.0-204.0 ng/dl 29.1 (L)   -will need repeat thyroid  studies in the future  -Synthroid 100 mcg   #Hypertension -h/o hypotension this admission  -held Lisinopril  -monitor BP  #H/o Anxiety/Depression -h/o hospitalization -held Zoloft   #Hyperglycemia  -HA1C 5.7  -SSI-S -monitor cbgs   #Thrombocytopenia  -severe likely 2/2 critical illness, infection/drugs. plts as low as 23 ITP less likely per H/O -DIC panel +, HIT panel -, checked this admission  -H/O consulted rec transfuse if plts <10K or overt bleeding  -trend CBC   #Abdominal distension -CT abdomen/pelvis negative 09/06/13. Negative C.diff    -pending Abd 1 view Xray   #FOBT + 09/16/13   #Malnutrition  -Albumin 1.6   #THC abuse  -Noted UDS 09/06/13   #F/E/N -D5W 30 cc/hr  -will monitor and trend electrolytes (HyperNa>hyponatremia, HypoK resolved) -regular thin liquid  -monitor cbg   #DVT px  -held d/t possible concern for hemorrhage on CT and thrombocytopenia this admission  -scds   Full code     PCP [name], MD)   Signed: Cresenciano Genre, MD [phone] 09/17/2013, 10:36 AM   Patient seen and examined and history reviewed. Agree with above findings and plan. 58 yo WM with complex medical history. Admitted with staph bacteremia and probable septic cerebral emboli. Echo on 09/09/13 showed no vegetations. Encephalopathic but this is steadily improving. Acute renal failure with plans to initiate HD today. This am he developed diaphoresis without new fever. Denies any chest pain or SOB. Ecg showed mild ST elevation in 3 and avF but review of serial Ecgs since admission show fluctuating changes with variable degrees of LBBB. Exam is without diastolic murmur. We will cycle cardiac enzymes today. Repeat Ecg in am. OK to proceed with dialysis today. Continue IV antibiotics. Eventually will get TEE but no indication for one today.  [facility] 09/17/2013 10:44 AM

## 2013-09-17 NOTE — Progress Notes (Signed)
Patient ID: Bradley [name], male   DOB: 1955-09-09, 58 y.o.   MRN: [id number]         [facility]    Date of Admission:  09/08/2013    Total days of antibiotics 9         Principal Problem:   Sepsis Active Problems:   HYPOTHYROIDISM- TSH 7.5   Aortic valve replaced- Bentall propceedure 2011   Hypertension   Depression   LBBB (left bundle branch block)   Major depressive disorder, recurrent, severe with psychotic features- hospitalized in Jan 2013   Generalized anxiety disorder   Altered mental state   Encephalitis   Septic embolism   HCAP (healthcare-associated pneumonia)   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   Acute diastolic heart failure   NSTEMI (non-ST elevated myocardial infarction)   Agitation   Acute respiratory alkalosis   Acute renal failure   Encephalopathy acute   Staphylococcus aureus bacteremia with sepsis   ST elevation (STEMI) myocardial infarction   . antiseptic oral rinse  15 mL Mouth Rinse q12n4p  . aspirin      . chlorhexidine  15 mL Mouth Rinse BID  . famotidine (PEPCID) IV  20 mg Intravenous Q12H  . feeding supplement (ENSURE COMPLETE)  237 mL Oral Q24H  . feeding supplement (RESOURCE BREEZE)  1 Container Oral TID WC  . insulin aspart  0-9 Units Subcutaneous 6 times per day  . levothyroxine  100 mcg Intravenous Daily  . nafcillin IV  2 g Intravenous Q4H  . rifampin (RIFADIN) IVPB  300 mg Intravenous 3 times per day  . sodium chloride  3 mL Intravenous Q12H    Subjective: He is alert but confused. He is talking with his wife.   Past Medical History  Diagnosis Date  . Anxiety   . Depression   . History of thyroid cancer   . Hypertension   . Hypothyroidism, postsurgical   . Aortic stenosis     s/p Bentall with bioprosthetic AVR 02/2010; Last echo (9/11): Moderate LVH, EF 45-50%, AVR functioning appropriately, aortic valve mean gradient 21, diastolic dysfunction. Chest MRA (2/13): Mild to moderate dilatation at the sinus of  Valsalva at 4.1 cm, mild dilatation ascending aorta distal to the tube graft at 3.9 cm, moderate dilatation of the innominate artery a 2.1 cm;    . Hx of cardiac cath     a. LHC in 02/2010: normal cors  . Hx of echocardiogram 2014    Echo (10/14): Severe LVH, EF 60-65%, normal wall motion, grade 1 diastolic dysfunction, AVR functioning normally, mild aortic stenosis (mean 19), moderately dilated aorta, mild LAE, mild RVE    History  Substance Use Topics  . Smoking status: Never Smoker   . Smokeless tobacco: Never Used  . Alcohol Use: No    Family History  Problem Relation Age of Onset  . Heart disease Father     No Known Allergies  Objective: Temp:  [97.6 F (36.4 C)-98.7 F (37.1 C)] 97.6 F (36.4 C) (02/18 0801) Pulse Rate:  [100-135] 106 (02/18 0801) Resp:  [10-27] 22 (02/18 0801) BP: (84-126)/(51-92) 99/76 mmHg (02/18 0801) SpO2:  [94 %-99 %] 98 % (02/18 0801) Weight:  [100.8 kg (222 lb 3.6 oz)-100.835 kg (222 lb 4.8 oz)] 100.8 kg (222 lb 3.6 oz) (02/18 0425) his weight is up 10 kg since admission  General: He is alert  Skin: No rash Lungs: Clear Cor: Regular [id number] 2/6 systolic murmur heard best at the right sternal  border Abdomen: Nontender   Lab Results Lab Results  Component Value Date   WBC 26.2* 09/17/2013   HGB 14.8 09/17/2013   HCT 40.9 09/17/2013   MCV 82.8 09/17/2013   PLT 165 09/17/2013    Lab Results  Component Value Date   CREATININE 8.89* 09/17/2013   BUN 90* 09/17/2013   NA 131* 09/17/2013   K 3.7 09/17/2013   CL 92* 09/17/2013   CO2 15* 09/17/2013    Lab Results  Component Value Date   ALT 29 09/17/2013   AST 35 09/17/2013   ALKPHOS 91 09/17/2013   BILITOT 1.7* 09/17/2013      Microbiology: Recent Results (from the past 240 hour(s))  CULTURE, BLOOD (ROUTINE X 2)     Status: None   Collection Time    09/08/13 10:00 PM      Result Value Ref Range Status   Specimen Description BLOOD RIGHT ARM   Final   Special Requests BOTTLES DRAWN AEROBIC AND  ANAEROBIC 5CC   Final   Culture  Setup Time     Final   Value: 09/09/2013 03:30     Performed at [facility]   Culture     Final   Value: STAPHYLOCOCCUS AUREUS     Note: RIFAMPIN AND GENTAMICIN SHOULD NOT BE USED AS SINGLE DRUGS FOR TREATMENT OF STAPH INFECTIONS.     Note: Gram Stain Report Called to,Read Back By and Verified With: NEELY RICHARDSON @ 1220 09/09/13 BY KRAWS     Performed at [facility]   Report Status 09/11/2013 FINAL   Final   Organism ID, Bacteria STAPHYLOCOCCUS AUREUS   Final  URINE CULTURE     Status: None   Collection Time    09/08/13 10:03 PM      Result Value Ref Range Status   Specimen Description URINE, RANDOM   Final   Special Requests NONE   Final   Culture  Setup Time     Final   Value: 09/09/2013 04:59     Performed at [place]     Final   Value: >=100,000 COLONIES/ML     Performed at [facility]   Culture     Final   Value: STAPHYLOCOCCUS AUREUS     Note: RIFAMPIN AND GENTAMICIN SHOULD NOT BE USED AS SINGLE DRUGS FOR TREATMENT OF STAPH INFECTIONS.     Performed at [facility]   Report Status 09/11/2013 FINAL   Final   Organism ID, Bacteria STAPHYLOCOCCUS AUREUS   Final  CULTURE, BLOOD (ROUTINE X 2)     Status: None   Collection Time    09/08/13 11:36 PM      Result Value Ref Range Status   Specimen Description BLOOD RIGHT ANTECUBITAL   Final   Special Requests BOTTLES DRAWN AEROBIC AND ANAEROBIC 5CC   Final   Culture  Setup Time     Final   Value: 09/09/2013 03:29     Performed at [facility]   Culture     Final   Value: STAPHYLOCOCCUS AUREUS     Note: SUSCEPTIBILITIES PERFORMED ON PREVIOUS CULTURE WITHIN THE LAST 5 DAYS.     Note: Gram Stain Report Called to,Read Back By and Verified With:  NEELY RICHARDSON @ 1220 09/09/13 BY KRAWS     Performed at [facility]   Report Status 09/11/2013 FINAL   Final  MRSA PCR SCREENING     Status: None   Collection  Time     09/09/13  4:44 AM      Result Value Ref Range Status   MRSA by PCR NEGATIVE  NEGATIVE Final   Comment:            The GeneXpert MRSA Assay (FDA     approved for NASAL specimens     only), is one component of a     comprehensive MRSA colonization     surveillance program. It is not     intended to diagnose MRSA     infection nor to guide or     monitor treatment for     MRSA infections.  CULTURE, BLOOD (ROUTINE X 2)     Status: None   Collection Time    09/10/13 11:05 AM      Result Value Ref Range Status   Specimen Description BLOOD RIGHT HAND   Final   Special Requests BOTTLES DRAWN AEROBIC ONLY 5CC   Final   Culture  Setup Time     Final   Value: 09/10/2013 14:22     Performed at [facility]   Culture     Final   Value: NO GROWTH 5 DAYS     Performed at [facility]   Report Status 09/16/2013 FINAL   Final  CULTURE, BLOOD (ROUTINE X 2)     Status: None   Collection Time    09/10/13 11:06 AM      Result Value Ref Range Status   Specimen Description BLOOD LEFT HAND   Final   Special Requests BOTTLES DRAWN AEROBIC AND ANAEROBIC 5CC   Final   Culture  Setup Time     Final   Value: 09/10/2013 14:27     Performed at [facility]   Culture     Final   Value: NO GROWTH 5 DAYS     Performed at [facility]   Report Status 09/16/2013 FINAL   Final  CLOSTRIDIUM DIFFICILE BY PCR     Status: None   Collection Time    09/15/13  2:00 AM      Result Value Ref Range Status   C difficile by pcr NEGATIVE  NEGATIVE Final   Comment: Performed at [facility]  MRSA PCR SCREENING     Status: None   Collection Time    09/15/13  5:31 PM      Result Value Ref Range Status   MRSA by PCR NEGATIVE  NEGATIVE Final   Comment:            The GeneXpert MRSA Assay (FDA     approved for NASAL specimens     only), is one component of a     comprehensive MRSA colonization     surveillance program. It is not     intended to diagnose MRSA     infection nor  to guide or     monitor treatment for     MRSA infections.  CLOSTRIDIUM DIFFICILE BY PCR     Status: None   Collection Time    09/16/13 10:53 AM      Result Value Ref Range Status   C difficile by pcr NEGATIVE  NEGATIVE Final    Studies/Results: Dg Abd 1 View  09/17/2013   CLINICAL DATA:  Abdominal distension  EXAM: ABDOMEN - 1 VIEW  COMPARISON:  CT ABD/PELVIS W CM dated 09/06/2013  FINDINGS: There is at least moderate gas distention of multiple loops of predominantly small bowel, though  there may be minimal gaseous distention of the splenic flexure of the colon.  Examination is degraded secondary to the coned field-of-view on solitary provided radiograph.  Nondiagnostic evaluation for pneumoperitoneum secondary supine positioning and exclusion of the lower thorax. No definite pneumatosis or portal venous gas.  No definite abnormal intra-abdominal calcifications.  Degenerative change the lower lumbar spine.  IMPRESSION: Degraded examination with findings concerning for small bowel obstruction though there may be a minimal amount of air within the splenic flexure of the colon. Clinical correlation is advised. Further evaluation with complete abdominal radiographic series may be performed as clinically indicated.   Electronically Signed   By: Sandi Mariscal M.D.   On: 09/17/2013 10:38   Dg Chest Port 1 View  09/17/2013   CLINICAL DATA:  Hemodialysis catheter placement  EXAM: PORTABLE CHEST - 1 VIEW  COMPARISON:  DG CHEST 1V PORT dated 09/17/2013; DG CHEST 1V PORT dated 09/13/2013; DG CHEST 1V PORT dated 09/08/2013  FINDINGS: Grossly unchanged enlarged cardiac silhouette and mediastinal contours post median sternotomy and valve replacement. Interval placement of left jugular approach dialysis catheter with tip projected over the superior aspect of the SVC. Lung volumes remain reduced. Pulmonary vasculature remains indistinct. Grossly unchanged perihilar and bibasilar heterogeneous / consolidative opacities, left  greater than right. No definite pleural effusion or pneumothorax. Grossly unchanged bones including postsurgical/traumatic deformity of the distal end of the right clavicle.  IMPRESSION: 1. Interval placement of left jugular approach central venous catheter with tip projected over the superior SVC. No pneumothorax. 2. Similar findings of pulmonary edema and perihilar/bibasilar opacities, left greater than right, atelectasis versus infiltrate.   Electronically Signed   By: Sandi Mariscal M.D.   On: 09/17/2013 10:41   Dg Chest Port 1 View  09/17/2013   CLINICAL DATA:  Confirm PICC line placement.  EXAM: PORTABLE CHEST - 1 VIEW  COMPARISON:  09/13/2013  FINDINGS: No PICC line is visualized within the chest or shoulder regions bilaterally.  There is cardiomegaly. Prior median sternotomy and valve replacement. Mild vascular congestion. Left lower lobe airspace opacity again noted, unchanged. No visible effusions.  IMPRESSION: No visible PICC line.  Stable cardiomegaly and vascular congestion.  Stable left lower lobe atelectasis or consolidation/pneumonia.   Electronically Signed   By: Rolm Baptise M.D.   On: 09/17/2013 04:08    Assessment: He is making some clinical and microbiologic progress but has progressive acute renal insufficiency.  Plan: 1. Continue nafcillin and rifampin  Michel Bickers, MD [facility] for Infectious [place] [phone] pager   [phone] cell 09/17/2013, 11:00 AM

## 2013-09-17 NOTE — Procedures (Signed)
Hemodialysis Insertion Procedure Note Bradley [name] [id number] October 16, 1955  Procedure: Insertion of Hemodialysis Catheter Type: 3 port  Indications: Hemodialysis   Procedure Details Consent: Risks of procedure as well as the alternatives and risks of each were explained to the (patient/caregiver).  Consent for procedure obtained. Time Out: Verified patient identification, verified procedure, site/side was marked, verified correct patient position, special equipment/implants available, medications/allergies/relevent history reviewed, required imaging and test results available.  Performed  Maximum sterile technique was used including antiseptics, cap, gloves, gown, hand hygiene, mask and sheet. Skin prep: Chlorhexidine; local anesthetic administered A antimicrobial bonded/coated triple lumen catheter was placed in the left internal jugular vein using the Seldinger technique. Ultrasound guidance used.yes Catheter placed to 20 cm. Blood aspirated via all 3 ports and then flushed x 3. Line sutured x 2 and dressing applied.  Evaluation Blood flow good Complications: No apparent complications Patient did tolerate procedure well. Chest X-ray ordered to verify placement.  CXR: pending.  Georgann Housekeeper, ACNP [place] [facility]/Critical Care Pager [phone] or [phone]  U/S used in placement.  I was present and supervised procedure.  Rush Farmer, M.D. [facility] Pulmonary/Critical Care Medicine. Pager: [phone]. After hours pager: [phone].

## 2013-09-17 NOTE — Progress Notes (Signed)
0533 Pt acutely diaphoretic and short of breath. Pt denies chest, left arm, abdominal pain. Pt confused, as per baseline this admission. EKG performed. NP Santiago Glad notified.

## 2013-09-17 NOTE — Progress Notes (Signed)
Assessment/Plan:  1 Oliguric AKI -worsening, due to bacteremia/immune complex or gentamicin. [name] has been stopped.  2 Staph aureus bacteremia / embolic strokes- suspected endocarditis  3 Congenital AoV disease, hx of tissue AoV replacement 2011  4 Encephalopathy  5 Pos Troponin  Plan: Will need to proceed with dialysis.    Subjective: Interval History: More encephalopathic  Objective: Vital signs in last 24 hours: Temp:  [97.6 F (36.4 C)-98.7 F (37.1 C)] 97.6 F (36.4 C) (02/18 0801) Pulse Rate:  [100-135] 106 (02/18 0801) Resp:  [10-27] 22 (02/18 0801) BP: (84-126)/(51-92) 99/76 mmHg (02/18 0801) SpO2:  [94 %-99 %] 98 % (02/18 0801) Weight:  [100.8 kg (222 lb 3.6 oz)-100.835 kg (222 lb 4.8 oz)] 100.8 kg (222 lb 3.6 oz) (02/18 0425) Weight change: 4.672 kg (10 lb 4.8 oz)  Intake/Output from previous day: 02/17 0701 - 02/18 0700 In: 1460 [I.V.:760; IV Piggyback:700] Out: 0  Intake/Output this shift: Total I/O In: 30 [I.V.:30] Out: -   General appearance: confused Resp: diminished breath sounds bilaterally Cardio: regular rate and rhythm, S1, S2 normal, no murmur, click, rub or gallop and valve sounds present GI: distended Extremities: edema 2+  Lab Results:  Recent Labs  09/16/13 0335 09/17/13 0524  WBC 21.8* 26.2*  HGB 12.9* 14.8  HCT 35.0* 40.9  PLT 92* 165   BMET:  Recent Labs  09/16/13 0335 09/17/13 0524  NA 137 131*  K 4.0 3.7  CL 98 92*  CO2 17* 15*  GLUCOSE 127* 140*  BUN 81* 90*  CREATININE 7.60* 8.89*  CALCIUM 7.0* 7.5*   No results found for this basename: PTH,  in the last 72 hours Iron Studies: No results found for this basename: IRON, TIBC, TRANSFERRIN, FERRITIN,  in the last 72 hours Studies/Results: US Renal  09/15/2013   CLINICAL DATA:  Acute renal failure.  EXAM: RENAL/URINARY TRACT ULTRASOUND COMPLETE  COMPARISON:  CT abdomen and pelvis 09/06/2013.  FINDINGS: Right Kidney:  Length: Approximately 12.7 cm. No hydronephrosis.  Well-preserved cortex. No shadowing calculi. Normal parenchymal echotexture without focal abnormalities.  Left Kidney:  Length: Approximately 12.5 cm. No hydronephrosis. Well-preserved cortex. No shadowing calculi. Normal parenchymal echotexture without focal abnormalities.  Bladder:  Decompressed by Foley catheter.  IMPRESSION: Normal kidneys.  Specifically, no evidence of hydronephrosis.   Electronically Signed   By: Evangeline Dakin M.D.   On: 09/15/2013 10:55   Dg Chest Port 1 View  09/17/2013   CLINICAL DATA:  Confirm PICC line placement.  EXAM: PORTABLE CHEST - 1 VIEW  COMPARISON:  09/13/2013  FINDINGS: No PICC line is visualized within the chest or shoulder regions bilaterally.  There is cardiomegaly. Prior median sternotomy and valve replacement. Mild vascular congestion. Left lower lobe airspace opacity again noted, unchanged. No visible effusions.  IMPRESSION: No visible PICC line.  Stable cardiomegaly and vascular congestion.  Stable left lower lobe atelectasis or consolidation/pneumonia.   Electronically Signed   By: Rolm Baptise M.D.   On: 09/17/2013 04:08   Scheduled: . antiseptic oral rinse  15 mL Mouth Rinse q12n4p  . aspirin      . chlorhexidine  15 mL Mouth Rinse BID  . famotidine (PEPCID) IV  20 mg Intravenous Q12H  . feeding supplement (ENSURE COMPLETE)  237 mL Oral Q24H  . feeding supplement (RESOURCE BREEZE)  1 Container Oral TID WC  . insulin aspart  0-9 Units Subcutaneous 6 times per day  . levothyroxine  100 mcg Intravenous Daily  . nafcillin IV  2 g  Intravenous Q4H  . rifampin (RIFADIN) IVPB  300 mg Intravenous 3 times per day  . sodium chloride  3 mL Intravenous Q12H     LOS: 9 days   Treyven Lafauci C 09/17/2013,8:48 AM

## 2013-09-17 NOTE — Progress Notes (Signed)
Spoke with staff RN re: order for PICC placement.  This patient's PICC needs to be placed by IR due to difficulty advancing PICC.  Patient's last PICC was placed by IR and they had to leave it as a Midline due to inablity to advance PICC catheter.

## 2013-09-17 NOTE — Progress Notes (Signed)
SLP Cancellation Note  Patient Details Name: Bradley [name] MRN: [id number] DOB: 1955/09/27   Cancelled treatment:       Reason Eval/Treat Not Completed: Patient at procedure or test/unavailable   Bates [place], Katherene Ponto 09/17/2013, 9:46 AM

## 2013-09-17 NOTE — Progress Notes (Signed)
0210 Found pt with PICC dressing off and PICC line withdrawn ~1 inch from site. Gown on left side soaked, no blood. Caps and ports checked and secured. No leaks found. Site cleansed and dressing placed. No weeping, erythema or warmth noted to left arm. Left arm swelling > right. Both lumens flushing well with blood return.  0330 To bedside to draw am labs. No blood return from either port. NP Santiago Glad advised.

## 2013-09-17 NOTE — Consult Note (Deleted)
[facility] Cardiology Consult Note    Chief Complaint: MSSA bacteremia s/p AVR, EKG changes (?STEMI), NSTEMI (type 2), increasing PR interval  History of Present Illness:  58 y.o with significant PMH brought in by family 2/8 to ED dx'ed with influenza (with sx's n/v/ab pain/bloating/diarrhea, myalgias) and tx'ed with Tamiflu and sent home.  Then he was brought in to [facility] 2/9 due to acute encephalopathy (since Friday prior to admission), generalized weakness/fatigue x 3 weeks. He has had a complicated admission with NSTEMI, severe sepsis (lactic acid 4.16 09/08/13), MSSA bacteremia, MSSA UTI, acute encephalopathy thought 2/2 sepsis + septic emboli/hemorrhage with c/w encephalitis, acute vs chronic diastolic heart failure, acute renal failure/ATN, concern for HCAP, abnormal thyroid studies, hypotension and thrombocytopenia.  He was transferred to [facility] from [facility] on 2/16 d/t worsening renal failure in preparation for HD.    09/17/13 ~530 patient was diaphoretic, sob confused. EKG with borderline STEMI criteria and patient was transferred from SDU to [facility] ICU for further care.    Subjective: This am ~530 am pt was sweaty, sob (now resolved). Denies chest pain and admits to feeling tired.    Meds: Medications Prior to Admission  Medication Sig Dispense Refill  . acetaminophen (TYLENOL) 500 MG tablet Take 500 mg by mouth every 6 (six) hours as needed for mild pain or fever.      Marland Kitchen aspirin 325 MG tablet Take 325 mg by mouth daily.        Marland Kitchen ibuprofen (ADVIL,MOTRIN) 200 MG tablet Take 200 mg by mouth every 6 (six) hours as needed for fever.      . levothyroxine (SYNTHROID, LEVOTHROID) 175 MCG tablet Take 175 mcg by mouth daily.      Marland Kitchen lisinopril (PRINIVIL,ZESTRIL) 10 MG tablet Take 1 tablet (10 mg total) by mouth daily.  30 tablet  11  . metoprolol (LOPRESSOR) 50 MG tablet Take 25 mg by mouth 2 (two) times daily.      . multivitamin (THERAGRAN) per tablet Take 1 tablet by mouth daily.         Marland Kitchen oseltamivir (TAMIFLU) 75 MG capsule Take 1 capsule (75 mg total) by mouth every 12 (twelve) hours.  10 capsule  0  . sertraline (ZOLOFT) 50 MG tablet Take 50 mg by mouth daily.       Current Facility-Administered Medications  Medication Dose Route Frequency Provider Last Rate Last Dose  . acetaminophen (TYLENOL) suppository 650 mg  650 mg Rectal Q6H PRN Berle Mull, MD   650 mg at 09/14/13 0140  . antiseptic oral rinse (BIOTENE) solution 15 mL  15 mL Mouth Rinse q12n4p Allie Bossier, MD   15 mL at 09/16/13 1603  . aspirin 81 MG chewable tablet           . chlorhexidine (PERIDEX) 0.12 % solution 15 mL  15 mL Mouth Rinse BID Allie Bossier, MD   15 mL at 09/16/13 1939  . dextrose 5 % solution   Intravenous Continuous Sol Blazing, MD 30 mL/hr at 09/17/13 0219    . famotidine (PEPCID) IVPB 20 mg  20 mg Intravenous Q12H Debbe Odea, MD   20 mg at 09/16/13 2209  . feeding supplement (ENSURE COMPLETE) (ENSURE COMPLETE) liquid 237 mL  237 mL Oral Q24H Baird Lyons, RD   237 mL at 09/16/13 1332  . feeding supplement (RESOURCE BREEZE) (RESOURCE BREEZE) liquid 1 Container  1 Container Oral TID WC Baird Lyons, RD      .  haloperidol lactate (HALDOL) injection 4 mg  4 mg Intravenous Q6H PRN Allie Bossier, MD   4 mg at 09/16/13 1338  . heparin injection 1,000 Units  1,000 Units Intravenous PRN Rush Farmer, MD      . insulin aspart (novoLOG) injection 0-9 Units  0-9 Units Subcutaneous 6 times per day Modena Jansky, MD   2 Units at 09/16/13 1956  . levothyroxine (SYNTHROID, LEVOTHROID) injection 100 mcg  100 mcg Intravenous Daily Allie Bossier, MD   100 mcg at 09/16/13 1046  . liver oil-zinc oxide (DESITIN) 40 % ointment   Topical PRN Gardiner Barefoot, NP      . metoprolol (LOPRESSOR) injection 2.5 mg  2.5 mg Intravenous Q4H PRN Berle Mull, MD   2.5 mg at 09/11/13 1706  . morphine 2 MG/ML injection 1 mg  1 mg Intravenous Q4H PRN Allie Bossier, MD   1 mg at 09/15/13 1906  .  nafcillin 2 g in dextrose 5 % 50 mL IVPB  2 g Intravenous Q4H Carlyle Basques, MD   2 g at 09/17/13 0650  . ondansetron (ZOFRAN) injection 4 mg  4 mg Intravenous Q6H PRN Berle Mull, MD   4 mg at 09/16/13 1943  . rifampin (RIFADIN) 300 mg in sodium chloride 0.9 % 100 mL IVPB  300 mg Intravenous 3 times per day Michel Bickers, MD   300 mg at 09/17/13 0609  . sodium chloride 0.9 % injection 3 mL  3 mL Intravenous Q12H Berle Mull, MD      . zolpidem (AMBIEN) tablet 5 mg  5 mg Oral QHS PRN Debbe Odea, MD   5 mg at 09/16/13 2125    Allergies: Allergies as of 09/08/2013  . (No Known Allergies)   Past Medical History  Diagnosis Date  . Anxiety   . Depression   . History of thyroid cancer   . Hypertension   . Hypothyroidism, postsurgical   . Aortic stenosis     s/p [place] with bioprosthetic AVR 02/2010; Last echo (9/11): Moderate LVH, EF 45-50%, AVR functioning appropriately, aortic valve mean gradient 21, diastolic dysfunction. Chest MRA (2/13): Mild to moderate dilatation at the sinus of Valsalva at 4.1 cm, mild dilatation ascending aorta distal to the tube graft at 3.9 cm, moderate dilatation of the innominate artery a 2.1 cm;    . Hx of cardiac cath     a. LHC in 02/2010: normal cors  . Hx of echocardiogram 2014    Echo (10/14): Severe LVH, EF 60-65%, normal wall motion, grade 1 diastolic dysfunction, AVR functioning normally, mild aortic stenosis (mean 19), moderately dilated aorta, mild LAE, mild RVE   Past Surgical History  Procedure Laterality Date  . Cardiac catheterization  03/02/2010    NORMAL CORONARY ARTERY  . Sternotomy      REDO  . Transthoracic echocardiogram  03/2010    SHOWED MILD REDUCTION OF LV FUNCTION  . Thyroidectomy    . Rotator cuff repair  2012    Right   Family History  Problem Relation Age of Onset  . Heart disease Father    History   Social History  . Marital Status: Married    Spouse Name: N/A    Number of Children: N/A  . Years of Education: N/A    Occupational History  . Not on file.   Social History Main Topics  . Smoking status: Never Smoker   . Smokeless tobacco: Never Used  . Alcohol Use: No  .  Drug Use: No  . Sexual Activity: No   Other Topics Concern  . Not on file   Social History Narrative  . No narrative on file    Review of Systems: General: +tired, resolved diaphoresiss  HEENT: +left neck HD catheter  Cardiac: denies chest pain  Pulm: resolved sob Ext: denies lower extremity edema  Neuro: confusion improved     Physical Exam: Blood pressure 99/76, pulse 106, temperature 97.6 F (36.4 C), temperature source Oral, resp. rate 22, height 6\' 1"  (1.854 m), weight 222 lb 3.6 oz (100.8 kg), SpO2 98.00%. Vitals reviewed. General: resting in bed, NAD HEENT: [place]/at, no scleral icterus, left neck HD catheter recently in place with blood on pillow  Cardiac: slightly tachycardic, +systolic murmur  Pulm: clear to auscultation bilaterally anteriorly  Abd/GU: firm, nontender, +distended, BS present, rectal tube intact  Ext: warm and well perfused, no pedal edema Neuro: alert and oriented X3, cranial nerves II-XII grossly intact, moving all 4 extremities Skin: no evidence of splinter hemorrhages fingernails b/l  Lab results: Basic Metabolic Panel:  Recent Labs  09/16/13 0335 09/17/13 0524  NA 137 131*  K 4.0 3.7  CL 98 92*  CO2 17* 15*  GLUCOSE 127* 140*  BUN 81* 90*  CREATININE 7.60* 8.89*  CALCIUM 7.0* 7.5*  MG 2.6* 2.8*  PHOS 6.0* 7.4*   Liver Function Tests:  Recent Labs  09/16/13 0335 09/17/13 0524  AST 26 35  ALT 22 29  ALKPHOS 86 91  BILITOT 1.7* 1.7*  PROT 5.7* 6.8  ALBUMIN 1.5* 1.6*   CBC:  Recent Labs  09/16/13 0335 09/17/13 0524  WBC 21.8* 26.2*  NEUTROABS 19.8* 24.1*  HGB 12.9* 14.8  HCT 35.0* 40.9  MCV 82.2 82.8  PLT 92* 165   Cardiac Enzymes: No results found for this basename: CKTOTAL, CKMB, CKMBINDEX, TROPONINI,  in the last 72 hours  CBG:  Recent Labs   09/16/13 1259 09/16/13 1724 09/16/13 1945 09/17/13 0014 09/17/13 0428 09/17/13 0803  GLUCAP 176* 162* 173* 109* 130* 136*   Thyroid Function Tests:  Recent Labs  09/14/13 1252  TSH 10.174*  FREET4 0.84   Coagulation:  Recent Labs  09/16/13 0335 09/17/13 0343  LABPROT 16.9* 15.2  INR 1.41 1.23   Urine Drug Screen: Drugs of Abuse     Component Value Date/Time   LABOPIA NONE DETECTED 09/06/2013 2237   COCAINSCRNUR NONE DETECTED 09/06/2013 2237   LABBENZ NONE DETECTED 09/06/2013 2237   AMPHETMU NONE DETECTED 09/06/2013 2237   THCU POSITIVE* 09/06/2013 2237   LABBARB NONE DETECTED 09/06/2013 2237   Urinalysis:  Ref. Range 09/06/2013 22:37 09/08/2013 22:03  Color, Urine Latest Range: YELLOW  AMBER (A) YELLOW  APPearance Latest Range: CLEAR  CLOUDY (A) CLOUDY (A)  Specific Gravity, Urine Latest Range: 1.005-1.030  1.043 (H) 1.021  pH Latest Range: 5.0-8.0  5.5 6.0  Glucose Latest Range: NEGATIVE mg/dL NEGATIVE 100 (A)  Bilirubin Urine Latest Range: NEGATIVE  NEGATIVE NEGATIVE  Ketones, ur Latest Range: NEGATIVE mg/dL 15 (A) NEGATIVE  Protein Latest Range: NEGATIVE mg/dL 30 (A) 100 (A)  Urobilinogen, UA Latest Range: 0.0-1.0 mg/dL 0.2 1.0  Nitrite Latest Range: NEGATIVE  NEGATIVE NEGATIVE  Leukocytes, UA Latest Range: NEGATIVE  NEGATIVE SMALL (A)  Hgb urine dipstick Latest Range: NEGATIVE  SMALL (A) LARGE (A)  Urine-Other No range found MUCOUS PRESENT   WBC, UA Latest Range: <3 WBC/hpf 0-2 7-10  RBC / HPF Latest Range: <3 RBC/hpf 0-2 21-50  Squamous Epithelial / LPF Latest  Range: RARE  FEW (A)   Bacteria, UA Latest Range: RARE  FEW (A) MANY (A)  Casts Latest Range: NEGATIVE   GRANULAR CAST (A)    Misc. Labs: Lactic acid  Troponin x3  CBC  Imaging results:  US Renal  09/15/2013   CLINICAL DATA:  Acute renal failure.  EXAM: RENAL/URINARY TRACT ULTRASOUND COMPLETE  COMPARISON:  CT abdomen and pelvis 09/06/2013.  FINDINGS: Right Kidney:  Length: Approximately 12.7 cm. No  hydronephrosis. Well-preserved cortex. No shadowing calculi. Normal parenchymal echotexture without focal abnormalities.  Left Kidney:  Length: Approximately 12.5 cm. No hydronephrosis. Well-preserved cortex. No shadowing calculi. Normal parenchymal echotexture without focal abnormalities.  Bladder:  Decompressed by Foley catheter.  IMPRESSION: Normal kidneys.  Specifically, no evidence of hydronephrosis.   Electronically Signed   By: Evangeline Dakin M.D.   On: 09/15/2013 10:55   Dg Chest Port 1 View  09/17/2013   CLINICAL DATA:  Confirm PICC line placement.  EXAM: PORTABLE CHEST - 1 VIEW  COMPARISON:  09/13/2013  FINDINGS: No PICC line is visualized within the chest or shoulder regions bilaterally.  There is cardiomegaly. Prior median sternotomy and valve replacement. Mild vascular congestion. Left lower lobe airspace opacity again noted, unchanged. No visible effusions.  IMPRESSION: No visible PICC line.  Stable cardiomegaly and vascular congestion.  Stable left lower lobe atelectasis or consolidation/pneumonia.   Electronically Signed   By: Rolm Baptise M.D.   On: 09/17/2013 04:08   2/13 CT head  IMPRESSION:  1. Limited study due to motion. No significant interval change in  subacute left parietal infarct with small amount of associated  hemorrhage. Additional multi focal infarcts involving the  supratentorial brain and right cerebellar hemisphere are not  significantly changed as well relative to recent MRI from  09/09/2013. No definite new intracranial infarct identified. No new  intracranial hemorrhage.  09/09/13 CT head  IMPRESSION:  1. Increasing edema around linear high attenuation in the left  parietal lobe. Followup MRI with and without contrast is recommended  for further assessment. The patient presents with an infectious  syndrome and septic emboli are in the differential considerations.  Infarct with a small amount of hemorrhage or cortical laminar  necrosis as well as  developmental venous anomaly remain in the  differential considerations. MRI should be useful in further  characterization.  2. Dr. Leonel Ramsay called discuss the case at 0230 hr on the day of  examination.   MRI brain 2/10  IMPRESSION:  Exam is motion degraded.  Multiple bilateral supra tentorial and right cerebellar regions of  restricted motion some of which are partially hemorrhagic with  largest area left parietal lobe. In the present clinical setting,  these findings are consistent with acute partially hemorrhagic  infarcts and possibly related to septic emboli.  These results were called by telephone at the time of interpretation  on 09/09/2013 at 11:54 AM to Dr. Algis Liming , who verbally acknowledged  these results.  04/2013 echo  Study Conclusions  - Left ventricle: The cavity size was normal. Wall thickness was increased in a pattern of severe LVH. Systolic function was normal. The estimated ejection fraction was in the range of 60% to 65%. Wall motion was normal; there were no regional wall motion abnormalities. Doppler parameters are consistent with abnormal left ventricular relaxation (grade 1 diastolic dysfunction). - Aortic valve: A bioprosthesis was present and functioning normally. There was mild stenosis. - Aorta: The aorta was moderately dilated. - Left atrium: The atrium was mildly dilated. -  Right ventricle: The cavity size was mildly dilated.   09/09/13 echo Study Conclusions  - Left ventricle: septal and apical hypokinesis The cavity size was mildly dilated. Wall thickness was increased in a pattern of moderate LVH. Systolic function was mildly reduced. The estimated ejection fraction was in the range of 45% to 50%. - Aortic valve: Apparent tissue AVR. No abscess or vegetation seen. No AR or perivalvular leak and normal systolic gradients Suggest TEE if clinical suspicion for SBE high - Mitral valve: Mild regurgitation. - Left atrium: The atrium was  mildly dilated.   Other results: EKG: 2/18 borderline inferior ST elevation 3, avf, LBBB  Assessment & Plan by Problem: 58 y.o male with complicated medication history.  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 This admission since 2/9 has been complicated with multiple medical issues (below).  He was transferred to [facility] from [facility] on 2/16 in case he needed HD for worsening renal function.  He was transferred from SDU to cardiac ICU 2/18 due to EKG changes, diaphoresis and sob.    #NSTEMI (non-ST elevated myocardial infarction) type 2  -likely 2/2 demand ischemia    Ref. Range 09/10/2013 07:53 09/10/2013 15:00 09/10/2013 23:15 09/12/2013 09:13 09/12/2013 14:19 09/12/2013 17:30 09/12/2013 20:10  Troponin I Latest Range: <0.30 ng/mL 2.26 (HH) 2.05 (HH) 1.78 (HH) 1.52 (HH) 1.54 (HH)  1.39 (HH)   -per notes EKG 2/18 with borderline inferior STEMI criteria with Ddx ACS/STEMI, valve thrombosis, septic emboli, demand ischemia. He was given Aspirin 324 mg x 1  -2/15 junctional rhythm on Precedex 2/15 and concern for increasing PR interval. -trending trops x 3 today  -EKGs have been variable this admission, ST elevation could be false representation due to LBBB.  Patient denies chest pain.  repeat EKG later this am    #MSSA bacteremia with concern for infective endocarditis s/p AVR -+cultures 09/08/13; repeat blood cultures NTD 2/11  -echo repeat (see above)  -ID, Cardiology following  -initially on Vanc 2/10-2/11, [name] (2/10-2/12), Cefepime (1 dose on 2/10)>Nafcillin on 2/10, Zosyn (1 dose given 2/9), Rifampin (dose on 2/15).  -ID rec tx x 6 weeks -Now pt only on Nafcillin  -Had TTE (no vegetation) will need TEE at some point in the future   #Septic  emboli/hemmorhage with encephalitis  -noted on CT head  -Neuro, ID following  -at some point this admission associated with left hemiparesis   #Acute vs chronic diastolic heart failure -concerned for this admission    Ref. Range 09/10/2013 07:53 09/10/2013 15:00 09/10/2013 23:15 09/12/2013 09:13 09/12/2013 14:19 09/12/2013 17:30 09/12/2013 20:10 09/14/2013 04:45  Pro B Natriuretic peptide (BNP) Latest Range: 0.0-100.0 pg/mL 37349.0 (H)       >70000.0 (H)   -strict i/o, daily weights  -cards following   #Acute renal failure -Could be ATN 2/2 bacteremia/immune complex thought less likely d/t medications. Gentamycin stopped.  -AG 24 today. Cre 8.89 (BL <1) -Nephro consulted and following  -renal US wnl, renal artery duplex pending  -HD today s/p left HD catheter  -strict i/o, daily weights, trend BMET   #Sepsis  -on Abx   #MSSA UTI  -on Abx   #Concern for HCAP (healthcare-associated pneumonia) -noted on CXR 1 view 2/9 left lung c/w early pneumonia with h/o small left pleural effusion  -On Abx  -Hospitalist, CCM following   #  Acute Encephalopathy, improved  -Initially thought 2/2 severe sepsis and brain emboli presented 2/10.  CT head with increasing edema in left parietal lobe.   -CTs and MRI this admission (see above)  -Neurology consulted   -ID consulted   #Acute respiratory alkalosis -pt was on Precedex gtt with improvement   #HYPOTHYROIDISM s/p thyroid cancer and thyroidectomy  -abnormal thyroid studies   Ref. Range 09/14/2013 12:52  TSH Latest Range: 0.350-4.500 uIU/mL 10.174 (H)  Free T4 Latest Range: 0.80-1.80 ng/dL 0.84  T3, Total Latest Range: 80.0-204.0 ng/dl 29.1 (L)   -will need repeat thyroid studies in the future  -Synthroid 100 mcg   #Hypertension -h/o hypotension this admission  -held Lisinopril  -monitor BP  #H/o Anxiety/Depression -h/o hospitalization -held Zoloft   #Hyperglycemia  -HA1C 5.7  -SSI-S -monitor cbgs   #Thrombocytopenia  -severe  likely 2/2 critical illness, infection/drugs. plts as low as 23 ITP less likely per H/O -DIC panel +, HIT panel -, checked this admission  -H/O consulted rec transfuse if plts <10K or overt bleeding  -trend CBC   #Abdominal distension -CT abdomen/pelvis negative 09/06/13. Negative C.diff    -pending Abd 1 view Xray   #FOBT + 09/16/13   #Malnutrition  -Albumin 1.6   #THC abuse  -Noted UDS 09/06/13   #F/E/N -D5W 30 cc/hr  -will monitor and trend electrolytes (HyperNa>hyponatremia, HypoK resolved) -regular thin liquid  -monitor cbg   #DVT px  -held d/t possible concern for hemorrhage on CT and thrombocytopenia this admission  -scds   Full code     PCP [name], MD)   Signed: Cresenciano Genre, MD [phone] 09/17/2013, 10:24 AM

## 2013-09-18 ENCOUNTER — Inpatient Hospital Stay (HOSPITAL_COMMUNITY): Payer: Medicaid Other

## 2013-09-18 DIAGNOSIS — I10 Essential (primary) hypertension: Secondary | ICD-10-CM

## 2013-09-18 DIAGNOSIS — R7881 Bacteremia: Secondary | ICD-10-CM

## 2013-09-18 DIAGNOSIS — B9689 Other specified bacterial agents as the cause of diseases classified elsewhere: Secondary | ICD-10-CM

## 2013-09-18 DIAGNOSIS — N390 Urinary tract infection, site not specified: Secondary | ICD-10-CM

## 2013-09-18 DIAGNOSIS — J189 Pneumonia, unspecified organism: Secondary | ICD-10-CM

## 2013-09-18 DIAGNOSIS — E890 Postprocedural hypothyroidism: Secondary | ICD-10-CM

## 2013-09-18 LAB — CBC WITH DIFFERENTIAL/PLATELET
Basophils Absolute: 0 10*3/uL (ref 0.0–0.1)
Basophils Relative: 0 % (ref 0–1)
Eosinophils Absolute: 0.2 10*3/uL (ref 0.0–0.7)
Eosinophils Relative: 1 % (ref 0–5)
HCT: 34.5 % — ABNORMAL LOW (ref 39.0–52.0)
Hemoglobin: 12.7 g/dL — ABNORMAL LOW (ref 13.0–17.0)
Lymphocytes Relative: 2 % — ABNORMAL LOW (ref 12–46)
Lymphs Abs: 0.5 10*3/uL — ABNORMAL LOW (ref 0.7–4.0)
MCH: 29.7 pg (ref 26.0–34.0)
MCHC: 36.8 g/dL — ABNORMAL HIGH (ref 30.0–36.0)
MCV: 80.8 fL (ref 78.0–100.0)
Monocytes Absolute: 1.2 10*3/uL — ABNORMAL HIGH (ref 0.1–1.0)
Monocytes Relative: 5 % (ref 3–12)
Neutro Abs: 22 10*3/uL — ABNORMAL HIGH (ref 1.7–7.7)
Neutrophils Relative %: 92 % — ABNORMAL HIGH (ref 43–77)
Platelets: 205 10*3/uL (ref 150–400)
RBC: 4.27 MIL/uL (ref 4.22–5.81)
RDW: 16.2 % — ABNORMAL HIGH (ref 11.5–15.5)
WBC: 23.9 10*3/uL — ABNORMAL HIGH (ref 4.0–10.5)

## 2013-09-18 LAB — PROTIME-INR
INR: 1.33 (ref 0.00–1.49)
Prothrombin Time: 16.2 seconds — ABNORMAL HIGH (ref 11.6–15.2)

## 2013-09-18 LAB — COMPREHENSIVE METABOLIC PANEL
ALT: 29 U/L (ref 0–53)
AST: 33 U/L (ref 0–37)
Albumin: 1.4 g/dL — ABNORMAL LOW (ref 3.5–5.2)
Alkaline Phosphatase: 73 U/L (ref 39–117)
BUN: 72 mg/dL — ABNORMAL HIGH (ref 6–23)
CO2: 20 mEq/L (ref 19–32)
Calcium: 7.1 mg/dL — ABNORMAL LOW (ref 8.4–10.5)
Chloride: 93 mEq/L — ABNORMAL LOW (ref 96–112)
Creatinine, Ser: 7.87 mg/dL — ABNORMAL HIGH (ref 0.50–1.35)
GFR calc Af Amer: 8 mL/min — ABNORMAL LOW (ref >90)
GFR calc non Af Amer: 7 mL/min — ABNORMAL LOW (ref >90)
Glucose, Bld: 132 mg/dL — ABNORMAL HIGH (ref 70–99)
Potassium: 4 mEq/L (ref 3.7–5.3)
Sodium: 135 mEq/L — ABNORMAL LOW (ref 137–147)
Total Bilirubin: 1.7 mg/dL — ABNORMAL HIGH (ref 0.3–1.2)
Total Protein: 6.1 g/dL (ref 6.0–8.3)

## 2013-09-18 LAB — URINE MICROSCOPIC-ADD ON

## 2013-09-18 LAB — GLUCOSE, CAPILLARY
Glucose-Capillary: 120 mg/dL — ABNORMAL HIGH (ref 70–99)
Glucose-Capillary: 122 mg/dL — ABNORMAL HIGH (ref 70–99)
Glucose-Capillary: 130 mg/dL — ABNORMAL HIGH (ref 70–99)
Glucose-Capillary: 135 mg/dL — ABNORMAL HIGH (ref 70–99)
Glucose-Capillary: 155 mg/dL — ABNORMAL HIGH (ref 70–99)

## 2013-09-18 MED ORDER — SODIUM CHLORIDE 0.9 % IV SOLN: 100.0000 mL | INTRAVENOUS | Status: DC | PRN

## 2013-09-18 MED ORDER — LIDOCAINE HCL (PF) 1 % IJ SOLN: 5.0000 mL | INTRAMUSCULAR | Status: DC | PRN

## 2013-09-18 MED ORDER — NEPRO/CARBSTEADY PO LIQD
237.0000 mL | ORAL | Status: DC | PRN
  Filled 2013-09-18: qty 237

## 2013-09-18 MED ORDER — HEPARIN SODIUM (PORCINE) 1000 UNIT/ML DIALYSIS: 1000.0000 [IU] | INTRAMUSCULAR | Status: DC | PRN

## 2013-09-18 MED ORDER — PENTAFLUOROPROP-TETRAFLUOROETH EX AERO: 1.0000 "application " | INHALATION_SPRAY | CUTANEOUS | Status: DC | PRN

## 2013-09-18 MED ORDER — ALTEPLASE 2 MG IJ SOLR
2.0000 mg | Freq: Once | INTRAMUSCULAR | Status: AC | PRN
  Filled 2013-09-18: qty 2

## 2013-09-18 MED ORDER — LIDOCAINE-PRILOCAINE 2.5-2.5 % EX CREA
1.0000 | TOPICAL_CREAM | CUTANEOUS | Status: DC | PRN
Start: 2013-09-18 — End: 2013-09-19
  Filled 2013-09-18: qty 5

## 2013-09-18 NOTE — Progress Notes (Addendum)
PULMONARY  / CRITICAL CARE MEDICINE  Name: Bradley [name] MRN: [id number] DOB: 1956/06/01 PCP [name], MD ADMISSION DATE:  09/08/2013  LOS 10 days   CONSULTATION DATE:  09/13/13  REFERRING MD :  DR Clydene Laming of [facility] PRIMARY SERVICE: [facility]  CHIEF COMPLAINT:  Encephalopathy. Intubation risk  BRIEF PATIENT DESCRIPTION:  58 year old male with s/p Bentall with bioprosthetic AV in august 2011. 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 Admitted 09/08/2013 with encephalopathy and septic picture with imaging showing septic embolli of brain and cultures revelaing MSSA bacteremia. Concern is for MSSA endocarditis but TEE unable to be done due to encephalopathy, and thrombocytopenia (Heme consult Dr Jana Hakim feels this is c/w infection). On 09/13/13 develping renal failure (d2 of gent stopped) and worsening encephlopathy and PCCM consulted.    SIGNIFICANT EVENTS / STUDIES:  2/09 - admit 2/10 - ID, Heme and Cards consult. ECHO ef 45%. No clear vegetations 2/14 - PCCM consult 2/15 - Junctional rhythm with precedex, mental status improved.  Transferred to [facility] for possible HD if renal fx deteriorates further 2/18 transferred to ICU for diaphoresis, ST changes and cofusion.   LINES / TUBES: Left Midline 09/13/13>> Foley 2/13 >>> L IJ HD catheter 2/18>>>  CULTURES: 2/09 - Blood - Staph Aureus MSSA 2/09 - blood - Staph Aureus MSSA 2/10 - MRSA PCR - negative 2/11 - blood >>> negative 2/16 C.diff >>> negative  ANTIBIOTICS: Zosyn 2./9 - 2/10 Cefepime 2/10 >>2/10 Vanc 2/9 - 2/12 Gent 2/12 >>2/14 (rise in creat) ---------------------------------- Nafcillin 2/10 >> Rifampin 2/15 >>>  SUBJECTIVE: Transferred to ICU for ST changes and ? STEMI   VITAL SIGNS: Filed Vitals:   09/18/13 1050 09/18/13 1055 09/18/13 1100 09/18/13 1105  BP: 92/41 100/40 99/53 103/53  Pulse: 61 67 90 92  Temp:      TempSrc:      Resp:      Height:       Weight:      SpO2:       INTAKE / OUTPUT: I/O last 3 completed shifts: In: 2373 [P.O.:180; I.V.:1093; IV Piggyback:1100] Out: [id number] [Urine:5; Emesis/NG output:200; Other:1000; Stool:300]  PHYSICAL EXAMINATION: General:  Pleasant well built male, in NAD. Neuro: A&O to person and place, not time.  Bizarre affect . HEENT:  Neck supple. Bearded male Cardiovascular:  RRR, 3/6 SEM  Lungs:  CTA bilaterally, no W/R/R. Abdomen:  BS x 4, Distended with decreased bs.  Musculoskeletal:  No cyanosis, No clubbing. No edema. No janeway lesions/splinter hemorrhages. Skin:  Intact anteriorly  LABS: PULMONARY  Recent Labs Lab 09/12/13 1650 09/13/13 0755 09/13/13 1315 09/13/13 2042 09/14/13 0745  PHART 7.543* 7.529* 7.470* 7.543* 7.496*  PCO2ART 28.7* 28.9* 33.6* 27.8* 28.7*  PO2ART 63.1* 72.4* 72.4* 67.3* 65.2*  HCO3 24.6* 24.0 24.2* 23.7 21.9  TCO2 21.5 20.8 21.2 20.5 19.0  O2SAT 94.6 96.2 95.9 95.5 94.8   CBC  Recent Labs Lab 09/17/13 0524 09/17/13 1447 09/18/13 0330  HGB 14.8 12.3* 12.7*  HCT 40.9 33.6* 34.5*  WBC 26.2* 24.7* 23.9*  PLT 165 145* 205   COAGULATION  Recent Labs Lab 09/14/13 0445 09/15/13 0341 09/16/13 0335 09/17/13 0343 09/18/13 0330  INR 1.37 1.17 1.41 1.23 1.33   CARDIAC   Recent Labs Lab 09/12/13 0913 09/12/13 1419 09/12/13 2010 09/17/13 1231 09/17/13 1830  TROPONINI 1.52* 1.54* 1.39* 0.38* 0.36*   Recent Labs Lab 09/14/13 0445  PROBNP >70000.0*   CHEMISTRY  Recent  Labs Lab 09/12/13 1730  09/14/13 0040 09/14/13 0445  09/15/13 0341 09/16/13 0335 09/17/13 0524 09/18/13 0330  NA 148*  < > 141 141  --  139 137 131* 135*  K 2.8*  < >  --  2.9*  < > 3.5* 4.0 3.7 4.0  CL 112  < >  --  103  --  101 98 92* 93*  CO2 24  < >  --  21  --  19 17* 15* 20  GLUCOSE 255*  < >  --  158*  --  131* 127* 140* 132*  BUN 34*  < >  --  56*  --  67* 81* 90* 72*  CREATININE 1.31  < >  --  3.89*  --  5.78* 7.60* 8.89* 7.87*  CALCIUM 8.0*  < >  --   7.4*  --  7.5* 7.0* 7.5* 7.1*  MG 2.4  --   --  2.3  --  2.4 2.6* 2.8*  --   PHOS  --   --   --  2.6  --  4.5 6.0* 7.4*  --   < > = values in this interval not displayed. Estimated Creatinine Clearance: 13.2 ml/min (by C-G formula based on Cr of 7.87).  LIVER  Recent Labs Lab 09/14/13 0445 09/15/13 0341 09/16/13 0335 09/17/13 0343 09/17/13 0524 09/18/13 0330  AST 24 23 26   --  35 33  ALT 26 24 22   --  29 29  ALKPHOS 81 84 86  --  91 73  BILITOT 1.5* 2.2* 1.7*  --  1.7* 1.7*  PROT 5.4* 5.7* 5.7*  --  6.8 6.1  ALBUMIN 1.7* 1.5* 1.5*  --  1.6* 1.4*  INR 1.37 1.17 1.41 1.23  --  1.33   INFECTIOUS  Recent Labs Lab 09/13/13 1241 09/14/13 0445 09/15/13 0341 09/17/13 1232  LATICACIDVEN 1.2  --   --  1.0  PROCALCITON 1.07 1.50 2.42  --    ENDOCRINE CBG (last 3)   Recent Labs  09/17/13 1925 09/17/13 2357 09/18/13 0340  GLUCAP 95 130* 120*    IMAGING x48h  Dg Abd 1 View  09/17/2013   CLINICAL DATA:  Abdominal distension  EXAM: ABDOMEN - 1 VIEW  COMPARISON:  CT ABD/PELVIS W CM dated 09/06/2013  FINDINGS: There is at least moderate gas distention of multiple loops of predominantly small bowel, though there may be minimal gaseous distention of the splenic flexure of the colon.  Examination is degraded secondary to the coned field-of-view on solitary provided radiograph.  Nondiagnostic evaluation for pneumoperitoneum secondary supine positioning and exclusion of the lower thorax. No definite pneumatosis or portal venous gas.  No definite abnormal intra-abdominal calcifications.  Degenerative change the lower lumbar spine.  IMPRESSION: Degraded examination with findings concerning for small bowel obstruction though there may be a minimal amount of air within the splenic flexure of the colon. Clinical correlation is advised. Further evaluation with complete abdominal radiographic series may be performed as clinically indicated.   Electronically Signed   By: Sandi Mariscal M.D.   On:  09/17/2013 10:38   Dg Chest Port 1 View  09/18/2013   CLINICAL DATA:  Respiratory distress, shortness of breath.  EXAM: PORTABLE CHEST - 1 VIEW  COMPARISON:  09/17/2013  FINDINGS: Prior CABG and valve replacement. Left pass catheterization is in stable position. Cardiomegaly. Left lower lobe opacity, likely atelectasis. No effusions or overt edema.  IMPRESSION: Cardiomegaly.  Left lower lobe atelectasis.   Electronically Signed  By: Rolm Baptise M.D.   On: 09/18/2013 06:27   Dg Chest Port 1 View  09/17/2013   CLINICAL DATA:  Hemodialysis catheter placement  EXAM: PORTABLE CHEST - 1 VIEW  COMPARISON:  DG CHEST 1V PORT dated 09/17/2013; DG CHEST 1V PORT dated 09/13/2013; DG CHEST 1V PORT dated 09/08/2013  FINDINGS: Grossly unchanged enlarged cardiac silhouette and mediastinal contours post median sternotomy and valve replacement. Interval placement of left jugular approach dialysis catheter with tip projected over the superior aspect of the SVC. Lung volumes remain reduced. Pulmonary vasculature remains indistinct. Grossly unchanged perihilar and bibasilar heterogeneous / consolidative opacities, left greater than right. No definite pleural effusion or pneumothorax. Grossly unchanged bones including postsurgical/traumatic deformity of the distal end of the right clavicle.  IMPRESSION: 1. Interval placement of left jugular approach central venous catheter with tip projected over the superior SVC. No pneumothorax. 2. Similar findings of pulmonary edema and perihilar/bibasilar opacities, left greater than right, atelectasis versus infiltrate.   Electronically Signed   By: Sandi Mariscal M.D.   On: 09/17/2013 10:41   Dg Chest Port 1 View  09/17/2013   CLINICAL DATA:  Confirm PICC line placement.  EXAM: PORTABLE CHEST - 1 VIEW  COMPARISON:  09/13/2013  FINDINGS: No PICC line is visualized within the chest or shoulder regions bilaterally.  There is cardiomegaly. Prior median sternotomy and valve replacement. Mild  vascular congestion. Left lower lobe airspace opacity again noted, unchanged. No visible effusions.  IMPRESSION: No visible PICC line.  Stable cardiomegaly and vascular congestion.  Stable left lower lobe atelectasis or consolidation/pneumonia.   Electronically Signed   By: Rolm Baptise M.D.   On: 09/17/2013 04:08   ASSESSMENT / PLAN:  PULMONARY A:  At Risk Acute Respiratory Failure - potential for intubation due to encephalopathy. P:   - Respiratory status stable and airway protection improved. - Pulmonary hygiene. - Oxygen to support sats > 93%.  CARDIOVASCULAR A:  NSTEMI /STEMI Acute Systolic CHF - BNP [id number] Hx BENTALL (2011) - [name]  Cardiology wished patient moved to the ICU out of concern for rhythm changes.  Now appears stable. P:  - Appreciate Cardiology input. - Tele monitoring - Hold TEE until more alert & awake, acute issues resolved.  Note, may need intubation for airway protection during procedure. - Will defer to cards as to when to do TEE and involve thoracic surgery.  RENAL A:   Acute Kidney Injury - on gent >>> dc'd 2/14. Cr continues to rise. Hypokalemia - resolved. P:   - Gent stopped - Tolerated HD yesterday and going up to HD today.  GASTROINTESTINAL A:   At risk aspiration P:   - Diet as tolerated. - Severely malnutritioned, will need help from nutrition once more stable.  HEMATOLOGIC A:   Thrombocytopenia - in setting of sepsis, improving. P:  - Monitor  INFECTIOUS A:   Leukocytosis - in setting of sepsis. MSSA bacteremia - source suspected bioprosthetic aortic valve, pending TEE for further evaluation. P:   - Per ID. - ?when to involve thoracic surgery.  ENDOCRINE A:   Hypothyroidism    P:   - Continue synthroid.  NEUROLOGIC A:   Encephalopathic likely uremia related, improved with HD. P:   - Supportive care. - Minimize sedating medications as able.  Patient transferred to the ICU for rhythm changes overnight, seems more  stable now.  Much improved with HD, will transfer to SDU and to [facility] service, PCCM will sign off, please call back if  needed.  Rush Farmer, M.D. [facility] Pulmonary/Critical Care Medicine. Pager: [phone]. After hours pager: [phone].

## 2013-09-18 NOTE — Progress Notes (Signed)
Assessment/Plan:  1 Oliguric AKI -worsening, due to bacteremia/immune complex or gentamicin. Bradley [name] has been stopped.  2 Staph aureus bacteremia / embolic strokes- suspected endocarditis  3 Congenital AoV disease, hx of tissue AoV replacement 2011  4 Encephalopathy  5 Pos Troponin   Plan: Will proceed with dialysis again today.   Subjective: Interval History: Tolerated dialysis yesterday  Objective: Vital signs in last 24 hours: Temp:  [97.5 F (36.4 [name])-98.6 F (37 [name])] 98.6 F (37 [name]) (02/19 0828) Pulse Rate:  [25-105] 49 (02/19 0800) Resp:  [20-32] 29 (02/19 0800) BP: (90-150)/(58-96) 107/72 mmHg (02/19 0800) SpO2:  [93 %-100 %] 95 % (02/19 0800) Weight:  [103.2 kg (227 lb 8.2 oz)-104.8 kg (231 lb 0.7 oz)] 103.8 kg (228 lb 13.4 oz) (02/19 0500) Weight change: 3.965 kg (8 lb 11.9 oz)  Intake/Output from previous day: 02/18 0701 - 02/19 0700 In: 1533 [P.O.:180; I.V.:703; IV Piggyback:650] Out: 1505 [Urine:5; Emesis/NG output:200; Stool:300] Intake/Output this shift: Total I/O In: 30 [I.V.:30] Out: -   General appearance: alert, slowed mentation and confused Resp: clear to auscultation bilaterally Cardio: regular rate and rhythm, S1, S2 normal, no murmur, click, rub or gallop Extremities: edema tr to 1+  Lab Results:  Recent Labs  09/17/13 1447 09/18/13 0330  WBC 24.7* 23.9*  HGB 12.3* 12.7*  HCT 33.6* 34.5*  PLT 145* 205   BMET:  Recent Labs  09/17/13 0524 09/18/13 0330  NA 131* 135*  K 3.7 4.0  CL 92* 93*  CO2 15* 20  GLUCOSE 140* 132*  BUN 90* 72*  CREATININE 8.89* 7.87*  CALCIUM 7.5* 7.1*   No results found for this basename: PTH,  in the last 72 hours Iron Studies: No results found for this basename: IRON, TIBC, TRANSFERRIN, FERRITIN,  in the last 72 hours Studies/Results: Dg Abd 1 View  09/17/2013   CLINICAL DATA:  Abdominal distension  EXAM: ABDOMEN - 1 VIEW  COMPARISON:  CT ABD/PELVIS W CM dated 09/06/2013  FINDINGS: There is at least moderate gas  distention of multiple loops of predominantly small bowel, though there may be minimal gaseous distention of the splenic flexure of the colon.  Examination is degraded secondary to the coned field-of-view on solitary provided radiograph.  Nondiagnostic evaluation for pneumoperitoneum secondary supine positioning and exclusion of the lower thorax. No definite pneumatosis or portal venous gas.  No definite abnormal intra-abdominal calcifications.  Degenerative change the lower lumbar spine.  IMPRESSION: Degraded examination with findings concerning for small bowel obstruction though there may be a minimal amount of air within the splenic flexure of the colon. Clinical correlation is advised. Further evaluation with complete abdominal radiographic series may be performed as clinically indicated.   Electronically Signed   By: Sandi Mariscal M.D.   On: 09/17/2013 10:38   Dg Chest Port 1 View  09/18/2013   CLINICAL DATA:  Respiratory distress, shortness of breath.  EXAM: PORTABLE CHEST - 1 VIEW  COMPARISON:  09/17/2013  FINDINGS: Prior CABG and valve replacement. Left pass catheterization is in stable position. Cardiomegaly. Left lower lobe opacity, likely atelectasis. No effusions or overt edema.  IMPRESSION: Cardiomegaly.  Left lower lobe atelectasis.   Electronically Signed   By: Rolm Baptise M.D.   On: 09/18/2013 06:27   Dg Chest Port 1 View  09/17/2013   CLINICAL DATA:  Hemodialysis catheter placement  EXAM: PORTABLE CHEST - 1 VIEW  COMPARISON:  DG CHEST 1V PORT dated 09/17/2013; DG CHEST 1V PORT dated 09/13/2013; DG CHEST 1V PORT dated  09/08/2013  FINDINGS: Grossly unchanged enlarged cardiac silhouette and mediastinal contours post median sternotomy and valve replacement. Interval placement of left jugular approach dialysis catheter with tip projected over the superior aspect of the SVC. Lung volumes remain reduced. Pulmonary vasculature remains indistinct. Grossly unchanged perihilar and bibasilar heterogeneous /  consolidative opacities, left greater than right. No definite pleural effusion or pneumothorax. Grossly unchanged bones including postsurgical/traumatic deformity of the distal end of the right clavicle.  IMPRESSION: 1. Interval placement of left jugular approach central venous catheter with tip projected over the superior SVC. No pneumothorax. 2. Similar findings of pulmonary edema and perihilar/bibasilar opacities, left greater than right, atelectasis versus infiltrate.   Electronically Signed   By: Sandi Mariscal M.D.   On: 09/17/2013 10:41   Dg Chest Port 1 View  09/17/2013   CLINICAL DATA:  Confirm PICC line placement.  EXAM: PORTABLE CHEST - 1 VIEW  COMPARISON:  09/13/2013  FINDINGS: No PICC line is visualized within the chest or shoulder regions bilaterally.  There is cardiomegaly. Prior median sternotomy and valve replacement. Mild vascular congestion. Left lower lobe airspace opacity again noted, unchanged. No visible effusions.  IMPRESSION: No visible PICC line.  Stable cardiomegaly and vascular congestion.  Stable left lower lobe atelectasis or consolidation/pneumonia.   Electronically Signed   By: Rolm Baptise M.D.   On: 09/17/2013 04:08   Scheduled: . antiseptic oral rinse  15 mL Mouth Rinse q12n4p  . chlorhexidine  15 mL Mouth Rinse BID  . famotidine (PEPCID) IV  20 mg Intravenous Q12H  . feeding supplement (ENSURE COMPLETE)  237 mL Oral Q24H  . feeding supplement (RESOURCE BREEZE)  1 Container Oral TID WC  . insulin aspart  0-9 Units Subcutaneous 6 times per day  . levothyroxine  100 mcg Intravenous Daily  . nafcillin IV  2 g Intravenous Q4H  . rifampin (RIFADIN) IVPB  300 mg Intravenous 3 times per day  . sodium chloride  3 mL Intravenous Q12H     LOS: 10 days   Bradley [name] 09/18/2013,8:59 AM

## 2013-09-18 NOTE — Progress Notes (Signed)
[name]  Bradley [name] [id number] DOB: 11-27-1955 DOA: 09/08/2013 PCP: [name], MD  Brief narrative: 58 year old male with history of aortic stenosis, status post pediatric aortic valvulotomy at age 54 followed by Bentall procedure 02/2000 with tissue AVR, cardiac cath 02/2010 with normal coronaries, LBBB, thyroid cancer-post thyroidectomy hypothyroidism, HTN and depression has been generally unwell for 2-3 weeks, recently seen in the ED on 09/06/13 and assessed as influenza related vomiting, diarrhea, abdominal pain, body aches, fever and mild confusion. Family was given option of hospitalization versus discharge home-family opted to go home and he was discharged on Tamiflu. However, patient worsened at home with progressive confusion/disorientation and was admitted with sepsis, septic brain embolization and rule out infective endocarditis   Assessment/Plan: Sepsis with MSSA bacteremia/? infective endocarditis -  Blood cultures x2 shows gram-positive cocci in clusters (MSSA).  -. Influenza panel PCR negative.  -Chest x-ray suggested early pneumonia of left lung base with small left pleural effusion.  -UA cx with MSSA UTI -Transthoracic echo (2/10) without vegetation- Cardiology:TEE planned when renal function more stable-has prior tissue AVR -Cards plans check EKG to monitor for lengthening of P-R interval which can herald perivalvular abscess- Qtc now > 500 ms so should we pursue TEE sooner rather than later? Defer to Cardiology -Infectious disease following  MSSA UTI - continue anbx  Metabolic encephalopathy/ septic emboli/? Left hemiparesis - Most likely secondary to severe sepsis,azotemia and septic brain emboli.  -MRI Brain (2/10) with multiple bilateral supra tentorial and right cerebellar regions c/wacute partially hemorrhagic infarcts suspected 2/2 septic emboli -Continue supportive care and tx underlying causes -cognition  improved after Precedex off >48 hrs -Neurology was consulted -improving further after initiation of HD  Severe thrombocytopenia  - Platelet counts have dropped from 100 to a nadir of 23 with current trend up -No schistocytes on peripheral smear.  -DIC panel positive - HIT panel negative therefore due to severe sepsis -Hematology following -No antiplatelets or heparin products at this time.  -Transfuse if has overt bleeding or platelet count less than 10 K.    Acute Renal Failure/Acute tubular necrosis -baseline renal fnx: 18/0.93 -progressive since admit so Nephro consulted 2/15 -did receive Gentamicin this admit but due to ARF but was dc'd 2/13 -Dr. Sherral Hammers (2/16) spoke with Dr. Erling Cruz (nephrology) about my concern of patient's increasing renal failure. Dr. Florene Glen evaluated patient and agreed that patient should be transferred to [facility] for possible dialysis vs [facility]. -continues with good UOP but cr has increased -no plans to begin HD at this point -Renal US WNL- Renal artery duplex pending  Abdominal distention/diarrhea -abd XR completed 2/18 showed significant dilated SB-RN reports emesis all night but none so far today -check portable 2V abd XR now- may need NGT -CT Abd from 2/7 films reviewed and note dilated loops SB vs TC dilatation but not mentioned in report -cont rectal foley-suspect diarrhea from azotemia  Hypokalemia  - replete prn  NSTEMI (Type II) - peak TNI 2.26  -No plans for cath at this point -has stable ST elevation r/t LBBB -had diaphoresis and tachypnea and tachycardia into am of 2/18 and concern was for STEMI given abn EKG (see above) but TNI barely elevated and more c/w expected findings in ARF and sx's resolved with start of HD  Acute Systolic HF and Chronic grade 1 Diastolic CHF  - ECHO Oct [id number] with EF 60-65% with severe LVH and no RWMA -ECHO this admit (2/10)  new lower EF 45=50% with septal and apical hypokinesis -consider OP ECHO to see if EF  recovers and if RWMA resolve  History of thyroid cancer, post thyroidectomy hypothyroidism  - TSH initially 7.675 on 2/10 and further increased to 10.174 ? sick euthyroid syndrome -Free T4= 0.84 but T3 was very (29.1)  low so Synthroid was increased by previous attending MD -repeat labs in 4-6 weeks  Hypertension  - Continue when necessary IV metoprolol.  -Holding lisinopril.   Insomnia -Try a low dose Ambien  Hyperglycemia  - Hemoglobin A1c = 5.7  -Continue sensitive SSI.   History of depression  - hold Zoloft secondary to acute encephalopathy.   Hyponatremia  -Resolved     DVT prophylaxis: SCDs Code Status: Full Family Communication: No family at bedside- examined in HD Disposition Plan/Expected LOS: Step down  Consultants: Cardiology Critical care medicine Nephrology Neurology Infectious disease  Procedures: None  Antibiotics: Gentamicin 2/10>>  Nafcillin 2/10>>  Vancomycin 2/10>>2/12   HPI/Subjective: Patient awake but does not appear to be a what I presume his preadmission baseline would be. No specific complaints verbalized except for insomnia.  Objective: Blood pressure 108/41, pulse 100, temperature 98 F (36.7 C), temperature source Oral, resp. rate 20, height 6\' 1"  (1.854 m), weight 227 lb 4.7 oz (103.1 kg), SpO2 95.00%.  Intake/Output Summary (Last 24 hours) at 09/18/13 1348 Last data filed at 09/18/13 1312  Gross per 24 hour  Intake   1273 ml  Output   1705 ml  Net   -432 ml     Exam: General: No acute respiratory distress Lungs: Clear to auscultation bilaterally without wheezes or crackles, RA Cardiovascular: Regular rate and rhythm with systolic murmur over aortic region without gallop or rub; otherwise normal S1 and S2, no peripheral edema or JVD Abdomen: Nontender, nondistended, soft, bowel sounds positive, no rebound, no ascites, no appreciable mass Musculoskeletal: No significant cyanosis, clubbing of bilateral lower  extremities Neurological: Alert and oriented x 3, left side weaker and appears somewhat neglectful of left-side at this time  Scheduled Meds:  Scheduled Meds: . antiseptic oral rinse  15 mL Mouth Rinse q12n4p  . chlorhexidine  15 mL Mouth Rinse BID  . famotidine (PEPCID) IV  20 mg Intravenous Q12H  . feeding supplement (ENSURE COMPLETE)  237 mL Oral Q24H  . feeding supplement (RESOURCE BREEZE)  1 Container Oral TID WC  . insulin aspart  0-9 Units Subcutaneous 6 times per day  . levothyroxine  100 mcg Intravenous Daily  . nafcillin IV  2 g Intravenous Q4H  . rifampin (RIFADIN) IVPB  300 mg Intravenous 3 times per day  . sodium chloride  3 mL Intravenous Q12H   Continuous Infusions: . dextrose 30 mL/hr at 09/17/13 2001    Data Reviewed: Basic Metabolic Panel:  Recent Labs Lab 09/12/13 1730  09/14/13 0040 09/14/13 0445 09/14/13 1252 09/15/13 0341 09/16/13 0335 09/17/13 0524 09/18/13 0330  NA 148*  < > 141 141  --  139 137 131* 135*  K 2.8*  < >  --  2.9* 3.5* 3.5* 4.0 3.7 4.0  CL 112  < >  --  103  --  101 98 92* 93*  CO2 24  < >  --  21  --  19 17* 15* 20  GLUCOSE 255*  < >  --  158*  --  131* 127* 140* 132*  BUN 34*  < >  --  56*  --  67* 81* 90* 72*  CREATININE 1.31  < >  --  3.89*  --  5.78* 7.60* 8.89* 7.87*  CALCIUM 8.0*  < >  --  7.4*  --  7.5* 7.0* 7.5* 7.1*  MG 2.4  --   --  2.3  --  2.4 2.6* 2.8*  --   PHOS  --   --   --  2.6  --  4.5 6.0* 7.4*  --   < > = values in this interval not displayed. Liver Function Tests:  Recent Labs Lab 09/14/13 0445 09/15/13 0341 09/16/13 0335 09/17/13 0524 09/18/13 0330  AST 24 23 26  35 33  ALT 26 24 22 29 29   ALKPHOS 81 84 86 91 73  BILITOT 1.5* 2.2* 1.7* 1.7* 1.7*  PROT 5.4* 5.7* 5.7* 6.8 6.1  ALBUMIN 1.7* 1.5* 1.5* 1.6* 1.4*   No results found for this basename: LIPASE, AMYLASE,  in the last 168 hours No results found for this basename: AMMONIA,  in the last 168 hours CBC:  Recent Labs Lab 09/15/13 0341  09/16/13 0335 09/17/13 0524 09/17/13 1447 09/18/13 0330  WBC 25.0* 21.8* 26.2* 24.7* 23.9*  NEUTROABS 22.9* 19.8* 24.1* 23.0* 22.0*  HGB 13.5 12.9* 14.8 12.3* 12.7*  HCT 37.4* 35.0* 40.9 33.6* 34.5*  MCV 82.4 82.2 82.8 81.4 80.8  PLT 81* 92* 165 145* 205   Cardiac Enzymes:  Recent Labs Lab 09/12/13 0913 09/12/13 1419 09/12/13 1730 09/12/13 2010 09/17/13 1231 09/17/13 1830  CKTOTAL  --   --  122  --   --   --   TROPONINI 1.52* 1.54*  --  1.39* 0.38* 0.36*   BNP (last 3 results)  Recent Labs  09/10/13 0753 09/14/13 0445  PROBNP 37349.0* >70000.0*   CBG:  Recent Labs Lab 09/17/13 1712 09/17/13 1925 09/17/13 2357 09/18/13 0340 09/18/13 0830  GLUCAP 93 95 130* 120* 155*    Recent Results (from the past 240 hour(s))  CULTURE, BLOOD (ROUTINE X 2)     Status: None   Collection Time    09/08/13 10:00 PM      Result Value Ref Range Status   Specimen Description BLOOD RIGHT ARM   Final   Special Requests BOTTLES DRAWN AEROBIC AND ANAEROBIC 5CC   Final   Culture  Setup Time     Final   Value: 09/09/2013 03:30     Performed at [facility]   Culture     Final   Value: STAPHYLOCOCCUS AUREUS     Note: RIFAMPIN AND GENTAMICIN SHOULD NOT BE USED AS SINGLE DRUGS FOR TREATMENT OF STAPH INFECTIONS.     Note: Gram Stain Report Called to,Read Back By and Verified With: NEELY RICHARDSON @ 1220 09/09/13 BY KRAWS     Performed at [facility]   Report Status 09/11/2013 FINAL   Final   Organism ID, Bacteria STAPHYLOCOCCUS AUREUS   Final  URINE CULTURE     Status: None   Collection Time    09/08/13 10:03 PM      Result Value Ref Range Status   Specimen Description URINE, RANDOM   Final   Special Requests NONE   Final   Culture  Setup Time     Final   Value: 09/09/2013 04:59     Performed at [place]     Final   Value: >=100,000 COLONIES/ML     Performed at [facility]   Culture     Final   Value: STAPHYLOCOCCUS AUREUS      Note: RIFAMPIN AND GENTAMICIN  SHOULD NOT BE USED AS SINGLE DRUGS FOR TREATMENT OF STAPH INFECTIONS.     Performed at [facility]   Report Status 09/11/2013 FINAL   Final   Organism ID, Bacteria STAPHYLOCOCCUS AUREUS   Final  CULTURE, BLOOD (ROUTINE X 2)     Status: None   Collection Time    09/08/13 11:36 PM      Result Value Ref Range Status   Specimen Description BLOOD RIGHT ANTECUBITAL   Final   Special Requests BOTTLES DRAWN AEROBIC AND ANAEROBIC 5CC   Final   Culture  Setup Time     Final   Value: 09/09/2013 03:29     Performed at [facility]   Culture     Final   Value: STAPHYLOCOCCUS AUREUS     Note: SUSCEPTIBILITIES PERFORMED ON PREVIOUS CULTURE WITHIN THE LAST 5 DAYS.     Note: Gram Stain Report Called to,Read Back By and Verified With:  NEELY RICHARDSON @ 1220 09/09/13 BY KRAWS     Performed at [facility]   Report Status 09/11/2013 FINAL   Final  MRSA PCR SCREENING     Status: None   Collection Time    09/09/13  4:44 AM      Result Value Ref Range Status   MRSA by PCR NEGATIVE  NEGATIVE Final   Comment:            The GeneXpert MRSA Assay (FDA     approved for NASAL specimens     only), is one component of a     comprehensive MRSA colonization     surveillance program. It is not     intended to diagnose MRSA     infection nor to guide or     monitor treatment for     MRSA infections.  CULTURE, BLOOD (ROUTINE X 2)     Status: None   Collection Time    09/10/13 11:05 AM      Result Value Ref Range Status   Specimen Description BLOOD RIGHT HAND   Final   Special Requests BOTTLES DRAWN AEROBIC ONLY 5CC   Final   Culture  Setup Time     Final   Value: 09/10/2013 14:22     Performed at [facility]   Culture     Final   Value: NO GROWTH 5 DAYS     Performed at [facility]   Report Status 09/16/2013 FINAL   Final  CULTURE, BLOOD (ROUTINE X 2)     Status: None   Collection Time    09/10/13 11:06 AM      Result Value  Ref Range Status   Specimen Description BLOOD LEFT HAND   Final   Special Requests BOTTLES DRAWN AEROBIC AND ANAEROBIC 5CC   Final   Culture  Setup Time     Final   Value: 09/10/2013 14:27     Performed at [facility]   Culture     Final   Value: NO GROWTH 5 DAYS     Performed at [facility]   Report Status 09/16/2013 FINAL   Final  CLOSTRIDIUM DIFFICILE BY PCR     Status: None   Collection Time    09/15/13  2:00 AM      Result Value Ref Range Status   C difficile by pcr NEGATIVE  NEGATIVE Final   Comment: Performed at [facility]  MRSA PCR SCREENING     Status: None  Collection Time    09/15/13  5:31 PM      Result Value Ref Range Status   MRSA by PCR NEGATIVE  NEGATIVE Final   Comment:            The GeneXpert MRSA Assay (FDA     approved for NASAL specimens     only), is one component of a     comprehensive MRSA colonization     surveillance program. It is not     intended to diagnose MRSA     infection nor to guide or     monitor treatment for     MRSA infections.  CLOSTRIDIUM DIFFICILE BY PCR     Status: None   Collection Time    09/16/13 10:53 AM      Result Value Ref Range Status   C difficile by pcr NEGATIVE  NEGATIVE Final     Studies:  Recent x-ray studies have been reviewed in detail by the Attending Physician  Time spent :     Erin Hearing, [facility] Office  [phone] Pager [phone]  **If unable to reach the above provider after paging please contact the [facility] @ [phone]  On-Call/Text Page:      [email or web address]      password [email or web address]  If 7PM-7AM, please contact night-coverage [email or web address] Password [email or web address] 09/18/2013, 1:48 PM   LOS: 10 days   I have examined the patient, reviewed the chart and modified the above note which I agree with.   Elius Etheredge,MD [id number] 09/18/2013, 6:45 PM

## 2013-09-18 NOTE — Progress Notes (Signed)
[facility] Progress Note   Subjective: Pt asleep not answering questions this am.  He wakes up and says "you are such a kiss a**"  RN: 1 L off in HD, Cr. Decreased, UOP decreased (5 cc urine), made pt NPO d/t vomiting 3 x with coffee ground emesis, decreased mentation. C/o abdominal pain.  Needs PT/OT at some point.   Meds: Medications Prior to Admission  Medication Sig Dispense Refill  . acetaminophen (TYLENOL) 500 MG tablet Take 500 mg by mouth every 6 (six) hours as needed for mild pain or fever.      Marland Kitchen aspirin 325 MG tablet Take 325 mg by mouth daily.        Marland Kitchen ibuprofen (ADVIL,MOTRIN) 200 MG tablet Take 200 mg by mouth every 6 (six) hours as needed for fever.      . levothyroxine (SYNTHROID, LEVOTHROID) 175 MCG tablet Take 175 mcg by mouth daily.      Marland Kitchen lisinopril (PRINIVIL,ZESTRIL) 10 MG tablet Take 1 tablet (10 mg total) by mouth daily.  30 tablet  11  . metoprolol (LOPRESSOR) 50 MG tablet Take 25 mg by mouth 2 (two) times daily.      . multivitamin (THERAGRAN) per tablet Take 1 tablet by mouth daily.        Marland Kitchen oseltamivir (TAMIFLU) 75 MG capsule Take 1 capsule (75 mg total) by mouth every 12 (twelve) hours.  10 capsule  0  . sertraline (ZOLOFT) 50 MG tablet Take 50 mg by mouth daily.       Current Facility-Administered Medications  Medication Dose Route Frequency Provider Last Rate Last Dose  . acetaminophen (TYLENOL) suppository 650 mg  650 mg Rectal Q6H PRN Berle Mull, MD   650 mg at 09/14/13 0140  . antiseptic oral rinse (BIOTENE) solution 15 mL  15 mL Mouth Rinse q12n4p Allie Bossier, MD   15 mL at 09/17/13 1600  . chlorhexidine (PERIDEX) 0.12 % solution 15 mL  15 mL Mouth Rinse BID Allie Bossier, MD   15 mL at 09/17/13 2000  . dextrose 5 % solution   Intravenous Continuous Juanito Doom, MD 30 mL/hr at 09/17/13 2001    . famotidine (PEPCID) IVPB 20 mg  20 mg Intravenous Q12H Debbe Odea, MD   20 mg at 09/17/13 2236  . feeding supplement (ENSURE  COMPLETE) (ENSURE COMPLETE) liquid 237 mL  237 mL Oral Q24H Baird Lyons, RD   237 mL at 09/16/13 1332  . feeding supplement (RESOURCE BREEZE) (RESOURCE BREEZE) liquid 1 Container  1 Container Oral TID WC Baird Lyons, RD   1 Container at 09/17/13 1700  . haloperidol lactate (HALDOL) injection 4 mg  4 mg Intravenous Q6H PRN Allie Bossier, MD   4 mg at 09/16/13 1338  . heparin injection 1,000 Units  1,000 Units Intravenous PRN Rush Farmer, MD   1.4 Units at 09/17/13 1026  . insulin aspart (novoLOG) injection 0-9 Units  0-9 Units Subcutaneous 6 times per day Modena Jansky, MD   1 Units at 09/18/13 0005  . levothyroxine (SYNTHROID, LEVOTHROID) injection 100 mcg  100 mcg Intravenous Daily Allie Bossier, MD   100 mcg at 09/17/13 1316  . liver oil-zinc oxide (DESITIN) 40 % ointment   Topical PRN Gardiner Barefoot, NP      . metoprolol (LOPRESSOR) injection 2.5 mg  2.5 mg Intravenous Q4H PRN Berle Mull, MD   2.5 mg at 09/11/13 1706  . morphine 2 MG/ML  injection 1 mg  1 mg Intravenous Q4H PRN Allie Bossier, MD   1 mg at 09/15/13 1906  . nafcillin 2 g in dextrose 5 % 50 mL IVPB  2 g Intravenous Q4H Carlyle Basques, MD   2 g at 09/18/13 0510  . ondansetron (ZOFRAN) injection 4 mg  4 mg Intravenous Q6H PRN Berle Mull, MD   4 mg at 09/16/13 1943  . rifampin (RIFADIN) 300 mg in sodium chloride 0.9 % 100 mL IVPB  300 mg Intravenous 3 times per day Michel Bickers, MD   300 mg at 09/18/13 0528  . sodium chloride 0.9 % injection 3 mL  3 mL Intravenous Q12H Berle Mull, MD   3 mL at 09/17/13 2145  . zolpidem (AMBIEN) tablet 5 mg  5 mg Oral QHS PRN Debbe Odea, MD   5 mg at 09/17/13 2313       Physical Exam: Blood pressure 107/72, pulse 49, temperature 98.1 F (36.7 C), temperature source Oral, resp. rate 29, height 6\' 1"  (1.854 m), weight 228 lb 13.4 oz (103.8 kg), SpO2 95.00%. Vitals reviewed. General: resting in bed asleep, NAD HEENT: [place]/at, no scleral icterus, left neck HD  catheter Cardiac: slightly tachycardic, +systolic murmur  Pulm: clear to auscultation bilaterally anteriorly  Abd/GU: firm, nontender, +distended, BS present, rectal tube intact  Ext: warm and well perfused, no pedal edema Neuro:asleep, not cooperative  Lab results: Basic Metabolic Panel:  Recent Labs  09/16/13 0335 09/17/13 0524 09/18/13 0330  NA 137 131* 135*  K 4.0 3.7 4.0  CL 98 92* 93*  CO2 17* 15* 20  GLUCOSE 127* 140* 132*  BUN 81* 90* 72*  CREATININE 7.60* 8.89* 7.87*  CALCIUM 7.0* 7.5* 7.1*  MG 2.6* 2.8*  --   PHOS 6.0* 7.4*  --    Liver Function Tests:  Recent Labs  09/17/13 0524 09/18/13 0330  AST 35 33  ALT 29 29  ALKPHOS 91 73  BILITOT 1.7* 1.7*  PROT 6.8 6.1  ALBUMIN 1.6* 1.4*   CBC:  Recent Labs  09/17/13 1447 09/18/13 0330  WBC 24.7* 23.9*  NEUTROABS 23.0* 22.0*  HGB 12.3* 12.7*  HCT 33.6* 34.5*  MCV 81.4 80.8  PLT 145* 205   Cardiac Enzymes:  Recent Labs  09/17/13 1231 09/17/13 1830  TROPONINI 0.38* 0.36*    CBG:  Recent Labs  09/17/13 0803 09/17/13 1200 09/17/13 1712 09/17/13 1925 09/17/13 2357 09/18/13 0340  GLUCAP 136* 92 93 95 130* 120*   Coagulation:  Recent Labs  09/17/13 0343 09/18/13 0330  LABPROT 15.2 16.2*  INR 1.23 1.33   Urine Drug Screen: Drugs of Abuse     Component Value Date/Time   LABOPIA NONE DETECTED 09/06/2013 2237   COCAINSCRNUR NONE DETECTED 09/06/2013 2237   LABBENZ NONE DETECTED 09/06/2013 2237   AMPHETMU NONE DETECTED 09/06/2013 2237   THCU POSITIVE* 09/06/2013 2237   LABBARB NONE DETECTED 09/06/2013 2237   Urinalysis:  Ref. Range 09/06/2013 22:37 09/08/2013 22:03  Color, Urine Latest Range: YELLOW  AMBER (A) YELLOW  APPearance Latest Range: CLEAR  CLOUDY (A) CLOUDY (A)  Specific Gravity, Urine Latest Range: 1.005-1.030  1.043 (H) 1.021  pH Latest Range: 5.0-8.0  5.5 6.0  Glucose Latest Range: NEGATIVE mg/dL NEGATIVE 100 (A)  Bilirubin Urine Latest Range: NEGATIVE  NEGATIVE NEGATIVE   Ketones, ur Latest Range: NEGATIVE mg/dL 15 (A) NEGATIVE  Protein Latest Range: NEGATIVE mg/dL 30 (A) 100 (A)  Urobilinogen, UA Latest Range: 0.0-1.0 mg/dL 0.2 1.0  Nitrite Latest  Range: NEGATIVE  NEGATIVE NEGATIVE  Leukocytes, UA Latest Range: NEGATIVE  NEGATIVE SMALL (A)  Hgb urine dipstick Latest Range: NEGATIVE  SMALL (A) LARGE (A)  Urine-Other No range found MUCOUS PRESENT   WBC, UA Latest Range: <3 WBC/hpf 0-2 7-10  RBC / HPF Latest Range: <3 RBC/hpf 0-2 21-50  Squamous Epithelial / LPF Latest Range: RARE  FEW (A)   Bacteria, UA Latest Range: RARE  FEW (A) MANY (A)  Casts Latest Range: NEGATIVE   GRANULAR CAST (A)    Misc. Labs: none  Imaging results:  Dg Abd 1 View  09/17/2013   CLINICAL DATA:  Abdominal distension  EXAM: ABDOMEN - 1 VIEW  COMPARISON:  CT ABD/PELVIS W CM dated 09/06/2013  FINDINGS: There is at least moderate gas distention of multiple loops of predominantly small bowel, though there may be minimal gaseous distention of the splenic flexure of the colon.  Examination is degraded secondary to the coned field-of-view on solitary provided radiograph.  Nondiagnostic evaluation for pneumoperitoneum secondary supine positioning and exclusion of the lower thorax. No definite pneumatosis or portal venous gas.  No definite abnormal intra-abdominal calcifications.  Degenerative change the lower lumbar spine.  IMPRESSION: Degraded examination with findings concerning for small bowel obstruction though there may be a minimal amount of air within the splenic flexure of the colon. Clinical correlation is advised. Further evaluation with complete abdominal radiographic series may be performed as clinically indicated.   Electronically Signed   By: Sandi Mariscal M.D.   On: 09/17/2013 10:38   Dg Chest Port 1 View  09/18/2013   CLINICAL DATA:  Respiratory distress, shortness of breath.  EXAM: PORTABLE CHEST - 1 VIEW  COMPARISON:  09/17/2013  FINDINGS: Prior CABG and valve replacement. Left  pass catheterization is in stable position. Cardiomegaly. Left lower lobe opacity, likely atelectasis. No effusions or overt edema.  IMPRESSION: Cardiomegaly.  Left lower lobe atelectasis.   Electronically Signed   By: Rolm Baptise M.D.   On: 09/18/2013 06:27   Dg Chest Port 1 View  09/17/2013   CLINICAL DATA:  Hemodialysis catheter placement  EXAM: PORTABLE CHEST - 1 VIEW  COMPARISON:  DG CHEST 1V PORT dated 09/17/2013; DG CHEST 1V PORT dated 09/13/2013; DG CHEST 1V PORT dated 09/08/2013  FINDINGS: Grossly unchanged enlarged cardiac silhouette and mediastinal contours post median sternotomy and valve replacement. Interval placement of left jugular approach dialysis catheter with tip projected over the superior aspect of the SVC. Lung volumes remain reduced. Pulmonary vasculature remains indistinct. Grossly unchanged perihilar and bibasilar heterogeneous / consolidative opacities, left greater than right. No definite pleural effusion or pneumothorax. Grossly unchanged bones including postsurgical/traumatic deformity of the distal end of the right clavicle.  IMPRESSION: 1. Interval placement of left jugular approach central venous catheter with tip projected over the superior SVC. No pneumothorax. 2. Similar findings of pulmonary edema and perihilar/bibasilar opacities, left greater than right, atelectasis versus infiltrate.   Electronically Signed   By: Sandi Mariscal M.D.   On: 09/17/2013 10:41   Dg Chest Port 1 View  09/17/2013   CLINICAL DATA:  Confirm PICC line placement.  EXAM: PORTABLE CHEST - 1 VIEW  COMPARISON:  09/13/2013  FINDINGS: No PICC line is visualized within the chest or shoulder regions bilaterally.  There is cardiomegaly. Prior median sternotomy and valve replacement. Mild vascular congestion. Left lower lobe airspace opacity again noted, unchanged. No visible effusions.  IMPRESSION: No visible PICC line.  Stable cardiomegaly and vascular congestion.  Stable left lower lobe  atelectasis or  consolidation/pneumonia.   Electronically Signed   By: Rolm Baptise M.D.   On: 09/17/2013 04:08   2/13 CT head  IMPRESSION:  1. Limited study due to motion. No significant interval change in  subacute left parietal infarct with small amount of associated  hemorrhage. Additional multi focal infarcts involving the  supratentorial brain and right cerebellar hemisphere are not  significantly changed as well relative to recent MRI from  09/09/2013. No definite new intracranial infarct identified. No new  intracranial hemorrhage.  09/09/13 CT head  IMPRESSION:  1. Increasing edema around linear high attenuation in the left  parietal lobe. Followup MRI with and without contrast is recommended  for further assessment. The patient presents with an infectious  syndrome and septic emboli are in the differential considerations.  Infarct with a small amount of hemorrhage or cortical laminar  necrosis as well as developmental venous anomaly remain in the  differential considerations. MRI should be useful in further  characterization.  2. Dr. Leonel Ramsay called discuss the case at 0230 hr on the day of  examination.   MRI brain 2/10  IMPRESSION:  Exam is motion degraded.  Multiple bilateral supra tentorial and right cerebellar regions of  restricted motion some of which are partially hemorrhagic with  largest area left parietal lobe. In the present clinical setting,  these findings are consistent with acute partially hemorrhagic  infarcts and possibly related to septic emboli.  These results were called by telephone at the time of interpretation  on 09/09/2013 at 11:54 AM to Dr. Algis Liming , who verbally acknowledged  these results.  04/2013 echo  Study Conclusions  - Left ventricle: The cavity size was normal. Wall thickness was increased in a pattern of severe LVH. Systolic function was normal. The estimated ejection fraction was in the range of 60% to 65%. Wall motion was normal;  there were no regional wall motion abnormalities. Doppler parameters are consistent with abnormal left ventricular relaxation (grade 1 diastolic dysfunction). - Aortic valve: A bioprosthesis was present and functioning normally. There was mild stenosis. - Aorta: The aorta was moderately dilated. - Left atrium: The atrium was mildly dilated. - Right ventricle: The cavity size was mildly dilated.   09/09/13 echo Study Conclusions  - Left ventricle: septal and apical hypokinesis The cavity size was mildly dilated. Wall thickness was increased in a pattern of moderate LVH. Systolic function was mildly reduced. The estimated ejection fraction was in the range of 45% to 50%. - Aortic valve: Apparent tissue AVR. No abscess or vegetation seen. No AR or perivalvular leak and normal systolic gradients Suggest TEE if clinical suspicion for SBE high - Mitral valve: Mild regurgitation. - Left atrium: The atrium was mildly dilated.   Other results: EKG: 2/18 borderline inferior ST elevation 3, avf, LBBB  Assessment & Plan by Problem: 58 y.o male with complicated medication history.  PMH pseudoanerysm>aortic stenosis s/p biprosthetic valve replacement Deneen Harts) 2011 and pediatric aortic valvulotomy age 57, heart cath 02/2010 normal, h/o LBBB, severe LVH (noted on EKG), h/o grade 1 diastolic dysfunction, hypothyroidism s/p thyroidectomy for thyroid cancer, HTN, anxiety/depression. This admission since 2/9 has been complicated with multiple medical issues (below).  He was transferred to [facility] from [facility] on 2/16 in case he needed HD for worsening renal function.  He was transferred from SDU to cardiac ICU 2/18 due to EKG changes, diaphoresis and sob.    #NSTEMI (non-ST elevated myocardial infarction) type 2  -likely 2/2 demand ischemia    Ref.  Range 09/10/2013 07:53 09/10/2013 15:00 09/10/2013 23:15 09/12/2013 09:13 09/12/2013 14:19 09/12/2013 17:30 09/12/2013 20:10  Troponin I Latest Range: <0.30 ng/mL 2.26  (HH) 2.05 (HH) 1.78 (HH) 1.52 (HH) 1.54 (HH)  1.39 (HH)    Ref. Range 09/17/2013 12:31 09/17/2013 18:30  Troponin I Latest Range: <0.30 ng/mL 0.38 (HH) 0.36 (HH)   -per notes EKG 2/18 with borderline inferior STEMI criteria trending EKGs -2/15 junctional rhythm on Precedex 2/15 and concern for increasing PR interval. -trending trops x 1 more -EKGs have been variable this admission, ST elevation could be false representation due to LBBB.  Most recent EKG with elevation in lead 3 and lateral ST depression    #MSSA bacteremia with concern for infective endocarditis s/p AVR -+cultures 09/08/13; repeat blood cultures NTD 2/11  -echo repeat (see above)  -ID, Cardiology following  -initially on Vanc 2/10-2/11, [name] (2/10-2/12), Cefepime (1 dose on 2/10)>Nafcillin on 2/10>>>, Zosyn (1 dose given 2/9), Rifampin (dose on 2/15>>).  -ID rec tx x 6 weeks -Now pt only on Nafcillin, Rifampin  -Had TTE (no vegetation) will need TEE at some point in the future   #Septic emboli/hemmorhage with encephalitis  -noted on CT head  -Neuro, ID following  -at some point this admission associated with left hemiparesis   #Acute vs chronic diastolic heart failure -concerned for this admission but appear improved. No lower extremity edema, sob   Ref. Range 09/10/2013 07:53 09/10/2013 15:00 09/10/2013 23:15 09/12/2013 09:13 09/12/2013 14:19 09/12/2013 17:30 09/12/2013 20:10 09/14/2013 04:45  Pro B Natriuretic peptide (BNP) Latest Range: 0.0-100.0 pg/mL 37349.0 (H)       >70000.0 (H)   -strict i/o, daily weights  -cards following   #Acute renal failure -Could be ATN 2/2 bacteremia/immune complex thought less likely d/t medications. Gentamycin stopped.  -AG 24>22 today. Cre 7.87 (BL <1). Slightly improved with HD -pt UOP decreased 30 cc/hr and only 5 cc urine out recently  -Nephro consulted and following  -renal US wnl, renal artery duplex pending  -HD 2/18, 2/19  -strict i/o, daily weights, trend BMET   #Sepsis  -on  Abx   #MSSA UTI  -on Abx   #Concern for HCAP (healthcare-associated pneumonia) -noted on CXR 1 view 2/19 improved  -Hospitalist, CCM following   #Acute Encephalopathy -fluctuating mental status w/in 24 hours  -Initially thought 2/2 severe sepsis and brain emboli presented 2/10.  CT head with increasing edema in left parietal lobe.   -CTs and MRI this admission (see above)  -Neurology consulted   -ID consulted    #HYPOTHYROIDISM s/p thyroid cancer and thyroidectomy  -abnormal thyroid studies   Ref. Range 09/14/2013 12:52  TSH Latest Range: 0.350-4.500 uIU/mL 10.174 (H)  Free T4 Latest Range: 0.80-1.80 ng/dL 0.84  T3, Total Latest Range: 80.0-204.0 ng/dl 29.1 (L)   -will need repeat thyroid studies in the future  -Synthroid 100 mcg iv   #Hypertension -h/o hypotension this admission  -held Lisinopril  -monitor BP  #H/o Anxiety/Depression -h/o hospitalization -held Zoloft   #Hyperglycemia  -improved -HA1C 5.7  -SSI-S -monitor cbgs   #Abdominal distension -CT abdomen/pelvis negative 09/06/13. Negative C.diff    -2/18 Abd 1 view Xray concerned for SBO -keep patient NPO  #FOBT + 09/16/13  -Now with coffee ground emesis x 3  -GI consult may be warranted   #Malnutrition  -Albumin 1.6>1.4   #THC abuse  -Noted UDS 09/06/13   #F/E/N -D5W 30 cc/hr  -will monitor and trend electrolytes (HyperNa>hyponatremia improving, HypoK resolved) -NPO  #DVT px  -held d/t  possible concern for hemorrhage on CT and thrombocytopenia this admission  -scds    Will need PT/OT in the future  Full code     PCP [name], MD)   Signed: Cresenciano Genre, MD [phone] 09/18/2013, 8:19 AM    Patient seen and examined and history reviewed. Agree with above findings and plan. Still with fluctuating mental status. No significant arrhythmias. Troponin mild elevated .54 but down from earlier in hospital. In setting of renal failure I doubt this represents a new ischemic event. For  dialysis today then agree with transfer to SDU.  [facility] 09/18/2013 1:59 PM

## 2013-09-18 NOTE — Progress Notes (Signed)
Speech Pathology Pt in HD, will need f/u from SLP when appropriate. Will check back tomorrow.  Herbie [place], [place] CCC-SLP [phone]

## 2013-09-18 NOTE — Progress Notes (Signed)
EKG CRITICAL VALUE     12 lead EKG performed.  Critical value noted.  Helane Rima, RN notified.   Evalee Mutton, CCT 09/18/2013 6:49 AM

## 2013-09-18 NOTE — Progress Notes (Addendum)
Patient ID: Bradley [name], male   DOB: November 19, 1955, 58 y.o.   MRN: [id number]         [facility]    Date of Admission:  09/08/2013    Total days of antibiotics 10         Principal Problem:   Sepsis Active Problems:   HYPOTHYROIDISM- TSH 7.5   Aortic valve replaced- Bentall propceedure 2011   Hypertension   Depression   LBBB (left bundle branch block)   Major depressive disorder, recurrent, severe with psychotic features- hospitalized in Jan 2013   Generalized anxiety disorder   Altered mental state   Encephalitis   Septic embolism   HCAP (healthcare-associated pneumonia)   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   Acute diastolic heart failure   NSTEMI (non-ST elevated myocardial infarction)   Agitation   Acute respiratory alkalosis   Acute renal failure   Encephalopathy acute   Staphylococcus aureus bacteremia with sepsis   ST elevation (STEMI) myocardial infarction   . antiseptic oral rinse  15 mL Mouth Rinse q12n4p  . chlorhexidine  15 mL Mouth Rinse BID  . famotidine (PEPCID) IV  20 mg Intravenous Q12H  . feeding supplement (ENSURE COMPLETE)  237 mL Oral Q24H  . feeding supplement (RESOURCE BREEZE)  1 Container Oral TID WC  . insulin aspart  0-9 Units Subcutaneous 6 times per day  . levothyroxine  100 mcg Intravenous Daily  . nafcillin IV  2 g Intravenous Q4H  . rifampin (RIFADIN) IVPB  300 mg Intravenous 3 times per day  . sodium chloride  3 mL Intravenous Q12H    Subjective: He is just back from his second hemodialysis.   Past Medical History  Diagnosis Date  . Anxiety   . Depression   . History of thyroid cancer   . Hypertension   . Hypothyroidism, postsurgical   . Aortic stenosis     s/p Bentall with bioprosthetic AVR 02/2010; Last echo (9/11): Moderate LVH, EF 45-50%, AVR functioning appropriately, aortic valve mean gradient 21, diastolic dysfunction. Chest MRA (2/13): Mild to moderate dilatation at the sinus of Valsalva at 4.1 cm, mild  dilatation ascending aorta distal to the tube graft at 3.9 cm, moderate dilatation of the innominate artery a 2.1 cm;    . Hx of cardiac cath     a. LHC in 02/2010: normal cors  . Hx of echocardiogram 2014    Echo (10/14): Severe LVH, EF 60-65%, normal wall motion, grade 1 diastolic dysfunction, AVR functioning normally, mild aortic stenosis (mean 19), moderately dilated aorta, mild LAE, mild RVE    History  Substance Use Topics  . Smoking status: Never Smoker   . Smokeless tobacco: Never Used  . Alcohol Use: No    Family History  Problem Relation Age of Onset  . Heart disease Father     No Known Allergies  Objective: Temp:  [97.7 F (36.5 C)-98.6 F (37 C)] 98 F (36.7 C) (02/19 1312) Pulse Rate:  [25-105] 100 (02/19 1312) Resp:  [17-32] 20 (02/19 1312) BP: (90-150)/(40-96) 95/62 mmHg (02/19 1343) SpO2:  [93 %-100 %] 95 % (02/19 1312) Weight:  [103.1 kg (227 lb 4.7 oz)-103.8 kg (228 lb 13.4 oz)] 103.1 kg (227 lb 4.7 oz) (02/19 1312)  General: He is very sleepy but answers simple questions appropriately  Skin: No rash Lungs: Clear Cor: Regular [id number] 2/6 systolic murmur heard best at the right sternal border Abdomen: Nontender   Lab Results Lab Results  Component Value  Date   WBC 23.9* 09/18/2013   HGB 12.7* 09/18/2013   HCT 34.5* 09/18/2013   MCV 80.8 09/18/2013   PLT 205 09/18/2013    Lab Results  Component Value Date   CREATININE 7.87* 09/18/2013   BUN 72* 09/18/2013   NA 135* 09/18/2013   K 4.0 09/18/2013   CL 93* 09/18/2013   CO2 20 09/18/2013    Lab Results  Component Value Date   ALT 29 09/18/2013   AST 33 09/18/2013   ALKPHOS 73 09/18/2013   BILITOT 1.7* 09/18/2013      Microbiology: Recent Results (from the past 240 hour(s))  CULTURE, BLOOD (ROUTINE X 2)     Status: None   Collection Time    09/08/13 10:00 PM      Result Value Ref Range Status   Specimen Description BLOOD RIGHT ARM   Final   Special Requests BOTTLES DRAWN AEROBIC AND ANAEROBIC 5CC    Final   Culture  Setup Time     Final   Value: 09/09/2013 03:30     Performed at [facility]   Culture     Final   Value: STAPHYLOCOCCUS AUREUS     Note: RIFAMPIN AND GENTAMICIN SHOULD NOT BE USED AS SINGLE DRUGS FOR TREATMENT OF STAPH INFECTIONS.     Note: Gram Stain Report Called to,Read Back By and Verified With: NEELY RICHARDSON @ 1220 09/09/13 BY KRAWS     Performed at [facility]   Report Status 09/11/2013 FINAL   Final   Organism ID, Bacteria STAPHYLOCOCCUS AUREUS   Final  URINE CULTURE     Status: None   Collection Time    09/08/13 10:03 PM      Result Value Ref Range Status   Specimen Description URINE, RANDOM   Final   Special Requests NONE   Final   Culture  Setup Time     Final   Value: 09/09/2013 04:59     Performed at [facility]     Final   Value: >=100,000 COLONIES/ML     Performed at [facility]   Culture     Final   Value: STAPHYLOCOCCUS AUREUS     Note: RIFAMPIN AND GENTAMICIN SHOULD NOT BE USED AS SINGLE DRUGS FOR TREATMENT OF STAPH INFECTIONS.     Performed at [facility]   Report Status 09/11/2013 FINAL   Final   Organism ID, Bacteria STAPHYLOCOCCUS AUREUS   Final  CULTURE, BLOOD (ROUTINE X 2)     Status: None   Collection Time    09/08/13 11:36 PM      Result Value Ref Range Status   Specimen Description BLOOD RIGHT ANTECUBITAL   Final   Special Requests BOTTLES DRAWN AEROBIC AND ANAEROBIC 5CC   Final   Culture  Setup Time     Final   Value: 09/09/2013 03:29     Performed at [facility]   Culture     Final   Value: STAPHYLOCOCCUS AUREUS     Note: SUSCEPTIBILITIES PERFORMED ON PREVIOUS CULTURE WITHIN THE LAST 5 DAYS.     Note: Gram Stain Report Called to,Read Back By and Verified With:  NEELY RICHARDSON @ 1220 09/09/13 BY KRAWS     Performed at [facility]   Report Status 09/11/2013 FINAL   Final  MRSA PCR SCREENING     Status: None   Collection Time    09/09/13  4:44 AM  Result Value Ref Range Status   MRSA by PCR NEGATIVE  NEGATIVE Final   Comment:            The GeneXpert MRSA Assay (FDA     approved for NASAL specimens     only), is one component of a     comprehensive MRSA colonization     surveillance program. It is not     intended to diagnose MRSA     infection nor to guide or     monitor treatment for     MRSA infections.  CULTURE, BLOOD (ROUTINE X 2)     Status: None   Collection Time    09/10/13 11:05 AM      Result Value Ref Range Status   Specimen Description BLOOD RIGHT HAND   Final   Special Requests BOTTLES DRAWN AEROBIC ONLY 5CC   Final   Culture  Setup Time     Final   Value: 09/10/2013 14:22     Performed at [facility]   Culture     Final   Value: NO GROWTH 5 DAYS     Performed at [facility]   Report Status 09/16/2013 FINAL   Final  CULTURE, BLOOD (ROUTINE X 2)     Status: None   Collection Time    09/10/13 11:06 AM      Result Value Ref Range Status   Specimen Description BLOOD LEFT HAND   Final   Special Requests BOTTLES DRAWN AEROBIC AND ANAEROBIC 5CC   Final   Culture  Setup Time     Final   Value: 09/10/2013 14:27     Performed at [facility]   Culture     Final   Value: NO GROWTH 5 DAYS     Performed at [facility]   Report Status 09/16/2013 FINAL   Final  CLOSTRIDIUM DIFFICILE BY PCR     Status: None   Collection Time    09/15/13  2:00 AM      Result Value Ref Range Status   C difficile by pcr NEGATIVE  NEGATIVE Final   Comment: Performed at [facility]  MRSA PCR SCREENING     Status: None   Collection Time    09/15/13  5:31 PM      Result Value Ref Range Status   MRSA by PCR NEGATIVE  NEGATIVE Final   Comment:            The GeneXpert MRSA Assay (FDA     approved for NASAL specimens     only), is one component of a     comprehensive MRSA colonization     surveillance program. It is not     intended to diagnose MRSA     infection nor to guide or      monitor treatment for     MRSA infections.  CLOSTRIDIUM DIFFICILE BY PCR     Status: None   Collection Time    09/16/13 10:53 AM      Result Value Ref Range Status   C difficile by pcr NEGATIVE  NEGATIVE Final    Studies/Results: Dg Abd 1 View  09/17/2013   CLINICAL DATA:  Abdominal distension  EXAM: ABDOMEN - 1 VIEW  COMPARISON:  CT ABD/PELVIS W CM dated 09/06/2013  FINDINGS: There is at least moderate gas distention of multiple loops of predominantly small bowel, though there may be minimal gaseous distention of the splenic flexure of the colon.  Examination is degraded secondary to the coned field-of-view on solitary provided radiograph.  Nondiagnostic evaluation for pneumoperitoneum secondary supine positioning and exclusion of the lower thorax. No definite pneumatosis or portal venous gas.  No definite abnormal intra-abdominal calcifications.  Degenerative change the lower lumbar spine.  IMPRESSION: Degraded examination with findings concerning for small bowel obstruction though there may be a minimal amount of air within the splenic flexure of the colon. Clinical correlation is advised. Further evaluation with complete abdominal radiographic series may be performed as clinically indicated.   Electronically Signed   By: Sandi Mariscal M.D.   On: 09/17/2013 10:38   Dg Chest Port 1 View  09/18/2013   CLINICAL DATA:  Respiratory distress, shortness of breath.  EXAM: PORTABLE CHEST - 1 VIEW  COMPARISON:  09/17/2013  FINDINGS: Prior CABG and valve replacement. Left pass catheterization is in stable position. Cardiomegaly. Left lower lobe opacity, likely atelectasis. No effusions or overt edema.  IMPRESSION: Cardiomegaly.  Left lower lobe atelectasis.   Electronically Signed   By: Rolm Baptise M.D.   On: 09/18/2013 06:27   Dg Chest Port 1 View  09/17/2013   CLINICAL DATA:  Hemodialysis catheter placement  EXAM: PORTABLE CHEST - 1 VIEW  COMPARISON:  DG CHEST 1V PORT dated 09/17/2013; DG CHEST 1V PORT dated  09/13/2013; DG CHEST 1V PORT dated 09/08/2013  FINDINGS: Grossly unchanged enlarged cardiac silhouette and mediastinal contours post median sternotomy and valve replacement. Interval placement of left jugular approach dialysis catheter with tip projected over the superior aspect of the SVC. Lung volumes remain reduced. Pulmonary vasculature remains indistinct. Grossly unchanged perihilar and bibasilar heterogeneous / consolidative opacities, left greater than right. No definite pleural effusion or pneumothorax. Grossly unchanged bones including postsurgical/traumatic deformity of the distal end of the right clavicle.  IMPRESSION: 1. Interval placement of left jugular approach central venous catheter with tip projected over the superior SVC. No pneumothorax. 2. Similar findings of pulmonary edema and perihilar/bibasilar opacities, left greater than right, atelectasis versus infiltrate.   Electronically Signed   By: Sandi Mariscal M.D.   On: 09/17/2013 10:41   Dg Chest Port 1 View  09/17/2013   CLINICAL DATA:  Confirm PICC line placement.  EXAM: PORTABLE CHEST - 1 VIEW  COMPARISON:  09/13/2013  FINDINGS: No PICC line is visualized within the chest or shoulder regions bilaterally.  There is cardiomegaly. Prior median sternotomy and valve replacement. Mild vascular congestion. Left lower lobe airspace opacity again noted, unchanged. No visible effusions.  IMPRESSION: No visible PICC line.  Stable cardiomegaly and vascular congestion.  Stable left lower lobe atelectasis or consolidation/pneumonia.   Electronically Signed   By: Rolm Baptise M.D.   On: 09/17/2013 04:08   EKG 09/18/2013: PR interval stable at 166 ms  Assessment: He is making some clinical and microbiologic progress.  Plan: 1. Continue nafcillin and rifampin  Bradley Bickers, MD [facility] [place] [facility] [phone] pager   [phone] cell 09/18/2013, 3:04 PM

## 2013-09-19 LAB — GLUCOSE, CAPILLARY
Glucose-Capillary: 108 mg/dL — ABNORMAL HIGH (ref 70–99)
Glucose-Capillary: 113 mg/dL — ABNORMAL HIGH (ref 70–99)
Glucose-Capillary: 114 mg/dL — ABNORMAL HIGH (ref 70–99)
Glucose-Capillary: 132 mg/dL — ABNORMAL HIGH (ref 70–99)
Glucose-Capillary: 141 mg/dL — ABNORMAL HIGH (ref 70–99)
Glucose-Capillary: 200 mg/dL — ABNORMAL HIGH (ref 70–99)

## 2013-09-19 LAB — CBC WITH DIFFERENTIAL/PLATELET
Basophils Absolute: 0 10*3/uL (ref 0.0–0.1)
Basophils Relative: 0 % (ref 0–1)
Eosinophils Absolute: 0.1 10*3/uL (ref 0.0–0.7)
Eosinophils Relative: 1 % (ref 0–5)
HCT: 28.4 % — ABNORMAL LOW (ref 39.0–52.0)
Hemoglobin: 10.3 g/dL — ABNORMAL LOW (ref 13.0–17.0)
Lymphocytes Relative: 4 % — ABNORMAL LOW (ref 12–46)
Lymphs Abs: 0.8 10*3/uL (ref 0.7–4.0)
MCH: 29.7 pg (ref 26.0–34.0)
MCHC: 36.3 g/dL — ABNORMAL HIGH (ref 30.0–36.0)
MCV: 81.8 fL (ref 78.0–100.0)
Monocytes Absolute: 0.5 10*3/uL (ref 0.1–1.0)
Monocytes Relative: 3 % (ref 3–12)
Neutro Abs: 16.9 10*3/uL — ABNORMAL HIGH (ref 1.7–7.7)
Neutrophils Relative %: 92 % — ABNORMAL HIGH (ref 43–77)
Platelets: 214 10*3/uL (ref 150–400)
RBC: 3.47 MIL/uL — ABNORMAL LOW (ref 4.22–5.81)
RDW: 16.1 % — ABNORMAL HIGH (ref 11.5–15.5)
WBC: 18.4 10*3/uL — ABNORMAL HIGH (ref 4.0–10.5)

## 2013-09-19 LAB — COMPREHENSIVE METABOLIC PANEL
ALT: 22 U/L (ref 0–53)
AST: 24 U/L (ref 0–37)
Albumin: 1.4 g/dL — ABNORMAL LOW (ref 3.5–5.2)
Alkaline Phosphatase: 59 U/L (ref 39–117)
BUN: 64 mg/dL — ABNORMAL HIGH (ref 6–23)
CO2: 22 mEq/L (ref 19–32)
Calcium: 7.1 mg/dL — ABNORMAL LOW (ref 8.4–10.5)
Chloride: 96 mEq/L (ref 96–112)
Creatinine, Ser: 7.79 mg/dL — ABNORMAL HIGH (ref 0.50–1.35)
GFR calc Af Amer: 8 mL/min — ABNORMAL LOW (ref >90)
GFR calc non Af Amer: 7 mL/min — ABNORMAL LOW (ref >90)
Glucose, Bld: 118 mg/dL — ABNORMAL HIGH (ref 70–99)
Potassium: 3.6 mEq/L — ABNORMAL LOW (ref 3.7–5.3)
Sodium: 137 mEq/L (ref 137–147)
Total Bilirubin: 1.7 mg/dL — ABNORMAL HIGH (ref 0.3–1.2)
Total Protein: 5.6 g/dL — ABNORMAL LOW (ref 6.0–8.3)

## 2013-09-19 LAB — PHOSPHORUS: Phosphorus: 6.7 mg/dL — ABNORMAL HIGH (ref 2.3–4.6)

## 2013-09-19 LAB — PROTIME-INR
INR: 1.48 (ref 0.00–1.49)
Prothrombin Time: 17.5 seconds — ABNORMAL HIGH (ref 11.6–15.2)

## 2013-09-19 LAB — MAGNESIUM: Magnesium: 2.2 mg/dL (ref 1.5–2.5)

## 2013-09-19 MED ORDER — FAMOTIDINE 20 MG PO TABS
20.0000 mg | ORAL_TABLET | Freq: Two times a day (BID) | ORAL | Status: DC
  Filled 2013-09-19 (×2): qty 1

## 2013-09-19 MED ORDER — INSULIN ASPART 100 UNIT/ML ~~LOC~~ SOLN
0.0000 [IU] | Freq: Three times a day (TID) | SUBCUTANEOUS | Status: DC
  Administered 2013-09-21: 2 [IU] via SUBCUTANEOUS
  Administered 2013-09-21 – 2013-09-22 (×4): 1 [IU] via SUBCUTANEOUS
  Administered 2013-09-23: 2 [IU] via SUBCUTANEOUS
  Administered 2013-09-23: 3 [IU] via SUBCUTANEOUS
  Administered 2013-09-24 – 2013-09-27 (×2): 1 [IU] via SUBCUTANEOUS
  Administered 2013-09-29: 2 [IU] via SUBCUTANEOUS
  Administered 2013-09-30 – 2013-10-01 (×2): 1 [IU] via SUBCUTANEOUS

## 2013-09-19 MED ORDER — HEPARIN SODIUM (PORCINE) 1000 UNIT/ML DIALYSIS: 1000.0000 [IU] | INTRAMUSCULAR | Status: DC | PRN

## 2013-09-19 MED ORDER — PANTOPRAZOLE SODIUM 40 MG PO TBEC
40.0000 mg | DELAYED_RELEASE_TABLET | Freq: Every day | ORAL | Status: DC
  Administered 2013-09-20 – 2013-10-10 (×20): 40 mg via ORAL
  Filled 2013-09-19 (×19): qty 1

## 2013-09-19 MED ORDER — LEVOTHYROXINE SODIUM 200 MCG PO TABS
200.0000 ug | ORAL_TABLET | Freq: Every day | ORAL | Status: DC
  Administered 2013-09-20 – 2013-10-10 (×18): 200 ug via ORAL
  Filled 2013-09-19 (×23): qty 1

## 2013-09-19 MED ORDER — PENTAFLUOROPROP-TETRAFLUOROETH EX AERO: 1.0000 "application " | INHALATION_SPRAY | CUTANEOUS | Status: DC | PRN

## 2013-09-19 MED ORDER — SODIUM CHLORIDE 0.9 % IV SOLN: 100.0000 mL | INTRAVENOUS | Status: DC | PRN

## 2013-09-19 MED ORDER — NEPRO/CARBSTEADY PO LIQD
237.0000 mL | ORAL | Status: DC | PRN
  Filled 2013-09-19: qty 237

## 2013-09-19 MED ORDER — LIDOCAINE-PRILOCAINE 2.5-2.5 % EX CREA: 1.0000 "application " | TOPICAL_CREAM | CUTANEOUS | Status: DC | PRN

## 2013-09-19 MED ORDER — LIDOCAINE HCL (PF) 1 % IJ SOLN: 5.0000 mL | INTRAMUSCULAR | Status: DC | PRN

## 2013-09-19 MED ORDER — ALTEPLASE 2 MG IJ SOLR: 2.0000 mg | Freq: Once | INTRAMUSCULAR | Status: AC | PRN

## 2013-09-19 MED ORDER — ACETAMINOPHEN 325 MG PO TABS
650.0000 mg | ORAL_TABLET | Freq: Four times a day (QID) | ORAL | Status: DC | PRN
  Administered 2013-09-19 – 2013-10-04 (×6): 650 mg via ORAL
  Filled 2013-09-19 (×7): qty 2

## 2013-09-19 NOTE — Progress Notes (Signed)
Assessment/Plan:  1 Oliguric AKI -worsening, due to bacteremia/immune complex or gentamicin. [name] has been stopped.  2 Staph aureus bacteremia / embolic strokes- suspected endocarditis  3 Congenital AoV disease, hx of tissue AoV replacement 2011  4 Encephalopathy  5 Pos Troponin  Plan: Will proceed with dialysis again Saturday. No heparin  Subjective: Interval History: Tolerating dialysis.  Objective: Vital signs in last 24 hours: Temp:  [97.6 F (36.4 C)-99.3 F (37.4 C)] 98.8 F (37.1 C) (02/20 1200) Pulse Rate:  [89-110] 100 (02/20 0200) Resp:  [19-28] 28 (02/20 0800) BP: (95-121)/(41-87) 121/87 mmHg (02/20 0800) SpO2:  [90 %-100 %] 95 % (02/20 0745) Weight:  [103.1 kg (227 lb 4.7 oz)-103.2 kg (227 lb 8.2 oz)] 103.2 kg (227 lb 8.2 oz) (02/20 0500) Weight change: -1.5 kg (-3 lb 4.9 oz)  Intake/Output from previous day: 02/19 0701 - 02/20 0700 In: 1250 [I.V.:600; IV Piggyback:650] Out: 200  Intake/Output this shift: Total I/O In: 150 [P.O.:120; I.V.:30] Out: -   General appearance: alert and canfused, Left IJ HD cath Resp: clear to auscultation bilaterally Chest wall: no tenderness Cardio: regular rate and rhythm, S1, S2 normal, no murmur, click, rub or gallop GI: soft, non-tender; bowel sounds normal; no masses,  no organomegaly Extremities: extremities normal, atraumatic, no cyanosis or edema  Lab Results:  Recent Labs  09/18/13 0330 09/19/13 0455  WBC 23.9* 18.4*  HGB 12.7* 10.3*  HCT 34.5* 28.4*  PLT 205 214   BMET:  Recent Labs  09/18/13 0330 09/19/13 0455  NA 135* 137  K 4.0 3.6*  CL 93* 96  CO2 20 22  GLUCOSE 132* 118*  BUN 72* 64*  CREATININE 7.87* 7.79*  CALCIUM 7.1* 7.1*   No results found for this basename: PTH,  in the last 72 hours Iron Studies: No results found for this basename: IRON, TIBC, TRANSFERRIN, FERRITIN,  in the last 72 hours Studies/Results: Dg Chest Port 1 View  09/18/2013   CLINICAL DATA:  Respiratory distress, shortness  of breath.  EXAM: PORTABLE CHEST - 1 VIEW  COMPARISON:  09/17/2013  FINDINGS: Prior CABG and valve replacement. Left pass catheterization is in stable position. Cardiomegaly. Left lower lobe opacity, likely atelectasis. No effusions or overt edema.  IMPRESSION: Cardiomegaly.  Left lower lobe atelectasis.   Electronically Signed   By: Rolm Baptise M.D.   On: 09/18/2013 06:27   Dg Abd Portable 2v  09/18/2013   CLINICAL DATA:  58 year old male with abdominal distension. Possible small bowel obstruction. Initial encounter.  EXAM: PORTABLE ABDOMEN - 2 VIEW  COMPARISON:  09/17/2013 and earlier.  FINDINGS: Supine and portable cross-table lateral views of the abdomen. Decreased gaseous distension of small bowel. Numerous gas-filled mostly nondilated loops persist. Persistent right colon gas. No pneumoperitoneum identified. Mild motion artifact on both views. Stable visualized osseous structures.  IMPRESSION: Improved bowel gas pattern with decreased gaseous distension of small bowel and persistent colon gas. Pattern could reflect improving small bowel obstruction or ileus.  No free air identified.   Electronically Signed   By: Lars Pinks M.D.   On: 09/18/2013 17:50   Scheduled: . antiseptic oral rinse  15 mL Mouth Rinse q12n4p  . chlorhexidine  15 mL Mouth Rinse BID  . famotidine (PEPCID) IV  20 mg Intravenous Q12H  . feeding supplement (ENSURE COMPLETE)  237 mL Oral Q24H  . feeding supplement (RESOURCE BREEZE)  1 Container Oral TID WC  . insulin aspart  0-9 Units Subcutaneous 6 times per day  . levothyroxine  100 mcg Intravenous Daily  . nafcillin IV  2 g Intravenous Q4H  . rifampin (RIFADIN) IVPB  300 mg Intravenous 3 times per day  . sodium chloride  3 mL Intravenous Q12H     LOS: 11 days   Miri Jose C 09/19/2013,12:38 PM

## 2013-09-19 NOTE — Progress Notes (Signed)
[facility] [place] [facility] - Stepdown / ICU Progress Note  Bradley [name] [id number] DOB: February 23, 1956 DOA: 09/08/2013 PCP: [name], MD  Brief narrative: 58 year old male with history of aortic stenosis, status post pediatric aortic valvulotomy at age 52 followed by Bentall procedure 02/2000 with tissue AVR, cardiac cath 02/2010 with normal coronaries, LBBB, thyroid cancer-post thyroidectomy >> hypothyroidism, HTN, and depression who had been generally unwell for 2-3 weeks.  He was seen in the ED on 09/06/13 and assessed as influenza related vomiting, diarrhea, abdominal pain, body aches, fever and mild confusion. Family was given option of hospitalization versus discharge home - family opted to go home and he was discharged on Tamiflu. However, patient worsened at home with progressive confusion/disorientation and was admitted with sepsis, septic brain embolization and rule out infective endocarditis.  HPI/Subjective: Patient awake but does not appear to be a what I presume his preadmission baseline would be. No specific complaints verbalized except for insomnia.  Assessment/Plan:  Sepsis with MSSA bacteremia/? infective endocarditis -Blood cultures x2 shows gram-positive cocci in clusters (MSSA).  -Influenza panel PCR negative.  -Chest x-ray suggested early pneumonia of left lung base with small left pleural effusion.  -UA cx with MSSA UTI -Transthoracic echo (2/10) without vegetation- Cardiology:TEE planned when other medical issues stabilized -has prior tissue AVR -Cards plans check EKG to monitor for lengthening of P-R interval which can herald perivalvular abscess -Infectious disease following  MSSA UTI - continue anbx  Metabolic encephalopathy/ septic emboli/? Left hemiparesis - Most likely secondary to severe sepsis,azotemia and septic brain emboli.  -MRI Brain (2/10) with multiple bilateral supra tentorial and right cerebellar regions c/wacute partially hemorrhagic infarcts  suspected 2/2 septic emboli -Continue supportive care and tx underlying causes -cognition improved after Precedex off >48 hrs -Neurology was consulted -improving daily since initiation of HD -Qtc prolonged so I have dc'd prn Haldol -PT/OT and ST for cognitive/language eval  Severe thrombocytopenia  -Platelet counts had dropped from 100 to a nadir of 23-platelet count has normalized -No schistocytes on peripheral smear.  -DIC panel positive - HIT panel negative therefore due to severe sepsis -Hematology following   Acute Renal Failure/Acute tubular necrosis -baseline renal fnx: 18/0.93 -progressive since admit so Nephro consulted 2/15 -did receive Gentamicin this admit but due to ARF but was dc'd 2/13 -Transferred to [facility] 09/15/2013 in anticipation of likely hemodialysis Hemodialysis started 09/17/2013 -Renal US WNL- Renal artery duplex pending  Abdominal distention/diarrhea -abd XR completed 2/18 showed significant dilated SB-RN reports emesis all night but none so far today -check portable 2V abd XR now- may need NGT -CT Abd from 2/7 films reviewed and note dilated loops SB vs TC dilatation but not mentioned in report -cont rectal foley-suspect diarrhea from azotemia  Hypokalemia  - replete prn  NSTEMI (Type II) - peak TNI 2.26  -No plans for cath at this point -has stable ST elevation r/t LBBB -had diaphoresis and tachypnea and tachycardia into am of 2/18 and concern was for STEMI given abn EKG (see above) but TNI barely elevated and more c/w expected findings in ARF and sx's resolved with start of HD  Acute Systolic HF and Chronic grade 1 Diastolic CHF  - ECHO Oct [id number] with EF 60-65% with severe LVH and no RWMA -ECHO this admit (2/10) new lower EF 45=50% with septal and apical hypokinesis -consider OP ECHO to see if EF recovers and if RWMA resolve  History of thyroid cancer, post thyroidectomy hypothyroidism  - TSH initially 7.675 on 2/10  and further increased to  10.174 ? sick euthyroid syndrome -Free T4= 0.84 but T3 was very (29.1)  low so Synthroid was increased by previous attending MD -repeat labs in 4-6 weeks  Hypertension  - Continue when necessary IV metoprolol.  -Holding lisinopril.   Insomnia -Try a low dose Ambien  Hyperglycemia  - Hemoglobin A1c = 5.7  -Continue sensitive SSI -dc dextrose IVFs esp since now on HD  History of depression  - hold Zoloft secondary to acute encephalopathy.   Hyponatremia  -Resolved   DVT prophylaxis: SCDs Code Status: Full Family Communication: No family at bedside Disposition Plan/Expected LOS: Step down  Consultants: Cardiology PCCM Nephrology Neurology Infectious disease  Procedures: None  Antibiotics: Nafcillin 2/10>> Rifampin 2/15 >> Vancomycin 2/10>>2/11 Gentamicin 2/10>> 2/13   Objective: Blood pressure 131/76, pulse 100, temperature 98.8 F (37.1 C), temperature source Oral, resp. rate 28, height 6\' 1"  (1.854 m), weight 227 lb 8.2 oz (103.2 kg), SpO2 96.00%.  Intake/Output Summary (Last 24 hours) at 09/19/13 1503 Last data filed at 09/19/13 1400  Gross per 24 hour  Intake   1450 ml  Output      0 ml  Net   1450 ml   Exam: General: No acute respiratory distress Lungs: Clear to auscultation bilaterally without wheezes or crackles, RA Cardiovascular: Regular rate and rhythm with systolic murmur over aortic region without gallop or rub; otherwise normal S1 and S2, no peripheral edema or JVD Abdomen: Nontender, nondistended, soft, bowel sounds positive, no rebound, no ascites, no appreciable mass Musculoskeletal: No significant cyanosis, clubbing of bilateral lower extremities Neurological: Alert and oriented x 3, left side weaker and appears somewhat neglectful of left-side at this time  Scheduled Meds:  Scheduled Meds: . antiseptic oral rinse  15 mL Mouth Rinse q12n4p  . chlorhexidine  15 mL Mouth Rinse BID  . famotidine (PEPCID) IV  20 mg Intravenous Q12H  .  feeding supplement (ENSURE COMPLETE)  237 mL Oral Q24H  . feeding supplement (RESOURCE BREEZE)  1 Container Oral TID WC  . insulin aspart  0-9 Units Subcutaneous 6 times per day  . levothyroxine  100 mcg Intravenous Daily  . nafcillin IV  2 g Intravenous Q4H  . rifampin (RIFADIN) IVPB  300 mg Intravenous 3 times per day  . sodium chloride  3 mL Intravenous Q12H   Data Reviewed: Basic Metabolic Panel:  Recent Labs Lab 09/14/13 0445  09/15/13 0341 09/16/13 0335 09/17/13 0524 09/18/13 0330 09/19/13 0455  NA 141  --  139 137 131* 135* 137  K 2.9*  < > 3.5* 4.0 3.7 4.0 3.6*  CL 103  --  101 98 92* 93* 96  CO2 21  --  19 17* 15* 20 22  GLUCOSE 158*  --  131* 127* 140* 132* 118*  BUN 56*  --  67* 81* 90* 72* 64*  CREATININE 3.89*  --  5.78* 7.60* 8.89* 7.87* 7.79*  CALCIUM 7.4*  --  7.5* 7.0* 7.5* 7.1* 7.1*  MG 2.3  --  2.4 2.6* 2.8*  --  2.2  PHOS 2.6  --  4.5 6.0* 7.4*  --  6.7*  < > = values in this interval not displayed. Liver Function Tests:  Recent Labs Lab 09/15/13 0341 09/16/13 0335 09/17/13 0524 09/18/13 0330 09/19/13 0455  AST 23 26 35 33 24  ALT 24 22 29 29 22   ALKPHOS 84 86 91 73 59  BILITOT 2.2* 1.7* 1.7* 1.7* 1.7*  PROT 5.7* 5.7* 6.8 6.1 5.6*  ALBUMIN 1.5* 1.5* 1.6* 1.4* 1.4*   CBC:  Recent Labs Lab 09/16/13 0335 09/17/13 0524 09/17/13 1447 09/18/13 0330 09/19/13 0455  WBC 21.8* 26.2* 24.7* 23.9* 18.4*  NEUTROABS 19.8* 24.1* 23.0* 22.0* 16.9*  HGB 12.9* 14.8 12.3* 12.7* 10.3*  HCT 35.0* 40.9 33.6* 34.5* 28.4*  MCV 82.2 82.8 81.4 80.8 81.8  PLT 92* 165 145* 205 214   Cardiac Enzymes:  Recent Labs Lab 09/12/13 1730 09/12/13 2010 09/17/13 1231 09/17/13 1830  CKTOTAL 122  --   --   --   TROPONINI  --  1.39* 0.38* 0.36*   CBG:  Recent Labs Lab 09/18/13 2039 09/19/13 0015 09/19/13 0444 09/19/13 0732 09/19/13 1223  GLUCAP 122* 114* 108* 113* 200*    Recent Results (from the past 240 hour(s))  CULTURE, BLOOD (ROUTINE X 2)      Status: None   Collection Time    09/10/13 11:05 AM      Result Value Ref Range Status   Specimen Description BLOOD RIGHT HAND   Final   Special Requests BOTTLES DRAWN AEROBIC ONLY 5CC   Final   Culture  Setup Time     Final   Value: 09/10/2013 14:22     Performed at [facility]   Culture     Final   Value: NO GROWTH 5 DAYS     Performed at [facility]   Report Status 09/16/2013 FINAL   Final  CULTURE, BLOOD (ROUTINE X 2)     Status: None   Collection Time    09/10/13 11:06 AM      Result Value Ref Range Status   Specimen Description BLOOD LEFT HAND   Final   Special Requests BOTTLES DRAWN AEROBIC AND ANAEROBIC 5CC   Final   Culture  Setup Time     Final   Value: 09/10/2013 14:27     Performed at [facility]   Culture     Final   Value: NO GROWTH 5 DAYS     Performed at [facility]   Report Status 09/16/2013 FINAL   Final  CLOSTRIDIUM DIFFICILE BY PCR     Status: None   Collection Time    09/15/13  2:00 AM      Result Value Ref Range Status   C difficile by pcr NEGATIVE  NEGATIVE Final   Comment: Performed at [facility]  MRSA PCR SCREENING     Status: None   Collection Time    09/15/13  5:31 PM      Result Value Ref Range Status   MRSA by PCR NEGATIVE  NEGATIVE Final   Comment:            The GeneXpert MRSA Assay (FDA     approved for NASAL specimens     only), is one component of a     comprehensive MRSA colonization     surveillance program. It is not     intended to diagnose MRSA     infection nor to guide or     monitor treatment for     MRSA infections.  CLOSTRIDIUM DIFFICILE BY PCR     Status: None   Collection Time    09/16/13 10:53 AM      Result Value Ref Range Status   C difficile by pcr NEGATIVE  NEGATIVE Final     Studies:  Recent x-ray studies have been reviewed in detail by the Attending Physician  Time spent : 35 mins  Erin Hearing, ANP [facility] Office  [phone] Pager  [phone]  **If unable to reach the above provider after paging please contact the [place] @ [phone]  On-Call/Text Page:      [email or web address]      password [email or web address]  If 7PM-7AM, please contact night-coverage [email or web address] Password [email or web address] 09/19/2013, 3:03 PM   LOS: 11 days   I have personally examined this patient and reviewed the entire database. I have reviewed the above note, made any necessary editorial changes, and agree with its content.  Cherene Altes, MD [facility]

## 2013-09-19 NOTE — Progress Notes (Signed)
Subjective: Pt denies complaints this am.  He spilled his breakfast on him this am.  States ab cramping after eating breakfast.    RN: no sig events.   Objective: Vital signs in last 24 hours: Filed Vitals:   09/19/13 0400 09/19/13 0500 09/19/13 0600 09/19/13 0745  BP: 95/64  97/66   Pulse:      Temp: 97.6 F (36.4 C)   98.5 F (36.9 C)  TempSrc: Oral     Resp: 19  19   Height:      Weight:  227 lb 8.2 oz (103.2 kg)    SpO2: 96%      Weight change: -3 lb 4.9 oz (-1.5 kg)  Intake/Output Summary (Last 24 hours) at 09/19/13 0843 Last data filed at 09/19/13 0700  Gross per 24 hour  Intake   1220 ml  Output    200 ml  Net   1020 ml   Vitals reviewed. General: resting in bed, NAD HEENT: [place]/at, no scleral icterus Cardiac: slightly tachycardic, +systolic murmur  Pulm: clear to auscultation bilaterally anteriorly  Abd: soft, nontender, mildly distended, BS present, obese Ext: warm and well perfused, no pedal edema Neuro: alert and oriented to [place] and person, not date, cranial nerves II-XII grossly intact, moving all 4 extremities  Lab Results: Basic Metabolic Panel:  Recent Labs Lab 09/17/13 0524 09/18/13 0330 09/19/13 0455  NA 131* 135* 137  K 3.7 4.0 3.6*  CL 92* 93* 96  CO2 15* 20 22  GLUCOSE 140* 132* 118*  BUN 90* 72* 64*  CREATININE 8.89* 7.87* 7.79*  CALCIUM 7.5* 7.1* 7.1*  MG 2.8*  --  2.2  PHOS 7.4*  --  6.7*   Liver Function Tests:  Recent Labs Lab 09/18/13 0330 09/19/13 0455  AST 33 24  ALT 29 22  ALKPHOS 73 59  BILITOT 1.7* 1.7*  PROT 6.1 5.6*  ALBUMIN 1.4* 1.4*   CBC:  Recent Labs Lab 09/18/13 0330 09/19/13 0455  WBC 23.9* 18.4*  NEUTROABS 22.0* 16.9*  HGB 12.7* 10.3*  HCT 34.5* 28.4*  MCV 80.8 81.8  PLT 205 214   Cardiac Enzymes:  Recent Labs Lab 09/12/13 1730 09/12/13 2010 09/17/13 1231 09/17/13 1830  CKTOTAL 122  --   --   --   TROPONINI  --  1.39* 0.38* 0.36*   BNP:  Recent Labs Lab 09/14/13 0445    PROBNP >70000.0*   CBG:  Recent Labs Lab 09/18/13 0340 09/18/13 0830 09/18/13 1653 09/18/13 2039 09/19/13 0015 09/19/13 0444  GLUCAP 120* 155* 135* 122* 114* 108*   Thyroid Function Tests:  Recent Labs Lab 09/14/13 1252  TSH 10.174*  FREET4 0.84   Coagulation:  Recent Labs Lab 09/16/13 0335 09/17/13 0343 09/18/13 0330 09/19/13 0455  LABPROT 16.9* 15.2 16.2* 17.5*  INR 1.41 1.23 1.33 1.48   Urine Drug Screen: Drugs of Abuse     Component Value Date/Time   LABOPIA NONE DETECTED 09/06/2013 2237   COCAINSCRNUR NONE DETECTED 09/06/2013 2237   LABBENZ NONE DETECTED 09/06/2013 2237   AMPHETMU NONE DETECTED 09/06/2013 2237   THCU POSITIVE* 09/06/2013 2237   LABBARB NONE DETECTED 09/06/2013 2237    Misc. Labs: none  Micro Results: Recent Results (from the past 240 hour(s))  CULTURE, BLOOD (ROUTINE X 2)     Status: None   Collection Time    09/10/13 11:05 AM      Result Value Ref Range Status   Specimen Description BLOOD RIGHT HAND   Final  Special Requests BOTTLES DRAWN AEROBIC ONLY 5CC   Final   Culture  Setup Time     Final   Value: 09/10/2013 14:22     Performed at [facility]   Culture     Final   Value: NO GROWTH 5 DAYS     Performed at [facility]   Report Status 09/16/2013 FINAL   Final  CULTURE, BLOOD (ROUTINE X 2)     Status: None   Collection Time    09/10/13 11:06 AM      Result Value Ref Range Status   Specimen Description BLOOD LEFT HAND   Final   Special Requests BOTTLES DRAWN AEROBIC AND ANAEROBIC 5CC   Final   Culture  Setup Time     Final   Value: 09/10/2013 14:27     Performed at [facility]   Culture     Final   Value: NO GROWTH 5 DAYS     Performed at [facility]   Report Status 09/16/2013 FINAL   Final  CLOSTRIDIUM DIFFICILE BY PCR     Status: None   Collection Time    09/15/13  2:00 AM      Result Value Ref Range Status   C difficile by pcr NEGATIVE  NEGATIVE Final   Comment: Performed at [facility]  MRSA PCR SCREENING     Status: None   Collection Time    09/15/13  5:31 PM      Result Value Ref Range Status   MRSA by PCR NEGATIVE  NEGATIVE Final   Comment:            The GeneXpert MRSA Assay (FDA     approved for NASAL specimens     only), is one component of a     comprehensive MRSA colonization     surveillance program. It is not     intended to diagnose MRSA     infection nor to guide or     monitor treatment for     MRSA infections.  CLOSTRIDIUM DIFFICILE BY PCR     Status: None   Collection Time    09/16/13 10:53 AM      Result Value Ref Range Status   C difficile by pcr NEGATIVE  NEGATIVE Final   Studies/Results: Dg Abd 1 View  09/17/2013   CLINICAL DATA:  Abdominal distension  EXAM: ABDOMEN - 1 VIEW  COMPARISON:  CT ABD/PELVIS W CM dated 09/06/2013  FINDINGS: There is at least moderate gas distention of multiple loops of predominantly small bowel, though there may be minimal gaseous distention of the splenic flexure of the colon.  Examination is degraded secondary to the coned field-of-view on solitary provided radiograph.  Nondiagnostic evaluation for pneumoperitoneum secondary supine positioning and exclusion of the lower thorax. No definite pneumatosis or portal venous gas.  No definite abnormal intra-abdominal calcifications.  Degenerative change the lower lumbar spine.  IMPRESSION: Degraded examination with findings concerning for small bowel obstruction though there may be a minimal amount of air within the splenic flexure of the colon. Clinical correlation is advised. Further evaluation with complete abdominal radiographic series may be performed as clinically indicated.   Electronically Signed   By: Sandi Mariscal M.D.   On: 09/17/2013 10:38   Dg Chest Port 1 View  09/18/2013   CLINICAL DATA:  Respiratory distress, shortness of breath.  EXAM: PORTABLE CHEST - 1 VIEW  COMPARISON:  09/17/2013  FINDINGS: Prior CABG and valve  replacement. Left pass catheterization  is in stable position. Cardiomegaly. Left lower lobe opacity, likely atelectasis. No effusions or overt edema.  IMPRESSION: Cardiomegaly.  Left lower lobe atelectasis.   Electronically Signed   By: Rolm Baptise M.D.   On: 09/18/2013 06:27   Dg Chest Port 1 View  09/17/2013   CLINICAL DATA:  Hemodialysis catheter placement  EXAM: PORTABLE CHEST - 1 VIEW  COMPARISON:  DG CHEST 1V PORT dated 09/17/2013; DG CHEST 1V PORT dated 09/13/2013; DG CHEST 1V PORT dated 09/08/2013  FINDINGS: Grossly unchanged enlarged cardiac silhouette and mediastinal contours post median sternotomy and valve replacement. Interval placement of left jugular approach dialysis catheter with tip projected over the superior aspect of the SVC. Lung volumes remain reduced. Pulmonary vasculature remains indistinct. Grossly unchanged perihilar and bibasilar heterogeneous / consolidative opacities, left greater than right. No definite pleural effusion or pneumothorax. Grossly unchanged bones including postsurgical/traumatic deformity of the distal end of the right clavicle.  IMPRESSION: 1. Interval placement of left jugular approach central venous catheter with tip projected over the superior SVC. No pneumothorax. 2. Similar findings of pulmonary edema and perihilar/bibasilar opacities, left greater than right, atelectasis versus infiltrate.   Electronically Signed   By: Sandi Mariscal M.D.   On: 09/17/2013 10:41   Dg Abd Portable 2v  09/18/2013   CLINICAL DATA:  58 year old male with abdominal distension. Possible small bowel obstruction. Initial encounter.  EXAM: PORTABLE ABDOMEN - 2 VIEW  COMPARISON:  09/17/2013 and earlier.  FINDINGS: Supine and portable cross-table lateral views of the abdomen. Decreased gaseous distension of small bowel. Numerous gas-filled mostly nondilated loops persist. Persistent right colon gas. No pneumoperitoneum identified. Mild motion artifact on both views. Stable visualized osseous structures.  IMPRESSION: Improved bowel  gas pattern with decreased gaseous distension of small bowel and persistent colon gas. Pattern could reflect improving small bowel obstruction or ileus.  No free air identified.   Electronically Signed   By: Lars Pinks M.D.   On: 09/18/2013 17:50   Medications:  Medications Prior to Admission  Medication Sig Dispense Refill  . acetaminophen (TYLENOL) 500 MG tablet Take 500 mg by mouth every 6 (six) hours as needed for mild pain or fever.      Marland Kitchen aspirin 325 MG tablet Take 325 mg by mouth daily.        Marland Kitchen ibuprofen (ADVIL,MOTRIN) 200 MG tablet Take 200 mg by mouth every 6 (six) hours as needed for fever.      . levothyroxine (SYNTHROID, LEVOTHROID) 175 MCG tablet Take 175 mcg by mouth daily.      Marland Kitchen lisinopril (PRINIVIL,ZESTRIL) 10 MG tablet Take 1 tablet (10 mg total) by mouth daily.  30 tablet  11  . metoprolol (LOPRESSOR) 50 MG tablet Take 25 mg by mouth 2 (two) times daily.      . multivitamin (THERAGRAN) per tablet Take 1 tablet by mouth daily.        Marland Kitchen oseltamivir (TAMIFLU) 75 MG capsule Take 1 capsule (75 mg total) by mouth every 12 (twelve) hours.  10 capsule  0  . sertraline (ZOLOFT) 50 MG tablet Take 50 mg by mouth daily.       Scheduled Meds: . antiseptic oral rinse  15 mL Mouth Rinse q12n4p  . chlorhexidine  15 mL Mouth Rinse BID  . famotidine (PEPCID) IV  20 mg Intravenous Q12H  . feeding supplement (ENSURE COMPLETE)  237 mL Oral Q24H  . feeding supplement (RESOURCE BREEZE)  1 Container Oral TID WC  .  insulin aspart  0-9 Units Subcutaneous 6 times per day  . levothyroxine  100 mcg Intravenous Daily  . nafcillin IV  2 g Intravenous Q4H  . rifampin (RIFADIN) IVPB  300 mg Intravenous 3 times per day  . sodium chloride  3 mL Intravenous Q12H   Continuous Infusions: . dextrose 30 mL/hr at 09/17/13 2001   PRN Meds:.sodium chloride, sodium chloride, acetaminophen, feeding supplement (NEPRO CARB STEADY), haloperidol lactate, heparin, heparin, lidocaine (PF), lidocaine-prilocaine, liver  oil-zinc oxide, metoprolol, morphine injection, ondansetron (ZOFRAN) IV, pentafluoroprop-tetrafluoroeth, zolpidem 09/09/13 echo Left ventricle: septal and apical hypokinesis The cavity size was mildly dilated. Wall thickness was increased in a pattern of moderate LVH. Systolic function was mildly reduced. The estimated ejection fraction was in the range of 45% to 50%.  ------------------------------------------------------------ Aortic valve: Apparent tissue AVR. No abscess or vegetation seen. No AR or perivalvular leak and normal systolic gradients Suggest TEE if clinical suspicion for SBE high Doppler: Peak velocity ratio of LVOT to aortic valve: 0.32. Mean velocity ratio of LVOT to aortic valve: 0.34. Mean gradient: 41mm Hg (S). Peak gradient: 11mm Hg (S).  ------------------------------------------------------------ Mitral valve: Mildly thickened leaflets . Doppler: Mild regurgitation.  ------------------------------------------------------------ Left atrium: The atrium was mildly dilated.  ------------------------------------------------------------ Atrial septum: Poorly visualized.  ------------------------------------------------------------ Right ventricle: The cavity size was normal. Wall thickness was normal. Systolic function was normal.  ------------------------------------------------------------ Pulmonic valve: Poorly visualized. The valve appears to be grossly normal.  ------------------------------------------------------------ Tricuspid valve: Poorly visualized. Doppler: Mild regurgitation.  ------------------------------------------------------------ Right atrium: The atrium was normal in size.  ------------------------------------------------------------ Pericardium: The pericardium was normal in appearance.   Assessment/Plan: 58 y.o male with complicated medication history. PMH pseudoanerysm>aortic stenosis s/p biprosthetic valve replacement Deneen Harts)  2011 and pediatric aortic valvulotomy age 81, heart cath 02/2010 normal, h/o LBBB, severe LVH (noted on EKG), h/o grade 1 diastolic dysfunction, hypothyroidism s/p thyroidectomy for thyroid cancer, HTN, anxiety/depression. This admission since 2/9 has been complicated with multiple medical issues (below). He was transferred to [facility] from [facility] on 2/16 in case he needed HD for worsening renal function. He was transferred from SDU to cardiac ICU 2/18 due to EKG changes, diaphoresis and sob (now resolved).   #MSSA bacteremia with concern for infective endocarditis s/p AVR  -+cultures 09/08/13; repeat blood cultures NTD 2/11  -echo repeat (see above)  -ID, Cardiology following  -ID rec tx x 6 weeks  -Now pt only on Nafcillin, Rifampin  -Had TTE 09/09/13 (no vegetation) will need TEE at some point in the future. Will wait until other medical issues stabilize.   #NSTEMI (non-ST elevated myocardial infarction) type 2  -likely 2/2 demand ischemia   Ref. Range  09/10/2013 07:53  09/10/2013 15:00  09/10/2013 23:15  09/12/2013 09:13  09/12/2013 14:19  09/12/2013 17:30  09/12/2013 20:10   Troponin I  Latest Range: <0.30 ng/mL  2.26 (HH)  2.05 (HH)  1.78 (HH)  1.52 (HH)  1.54 (HH)   1.39 (HH)     Ref. Range  09/17/2013 12:31  09/17/2013 18:30   Troponin I  Latest Range: <0.30 ng/mL  0.38 (HH)  0.36 (HH)    -serial EKGs    #Septic emboli/hemmorhage with encephalitis  -noted on CT head  -Neuro, ID following  -at some point this admission associated with left hemiparesis. Will need PT/OT/SLP  #Acute vs chronic diastolic heart failure  -concerned for this admission but appear improved. No lower extremity edema, sob  -echo 2/10 with EF 45-50% h/o grade 1 diastolic dysfunction noted 04/2013  Ref. Range  09/10/2013 07:53  09/10/2013 15:00  09/10/2013 23:15  09/12/2013 09:13  09/12/2013 14:19  09/12/2013 17:30  09/12/2013 20:10  09/14/2013 04:45   Pro B Natriuretic peptide (BNP)  Latest Range: 0.0-100.0 pg/mL  37349.0 (H)         >70000.0 (H)   -strict i/o, daily weights  -cards following   #Acute renal failure  -Could be ATN 2/2 bacteremia/immune complex thought less likely d/t medications. Gentamycin stopped.  -AG 24>22>19 today. Cre 7.79 (BL <1). Slightly improved with HD  -pt UOP decreased -Nephro consulted and following  -renal US wnl, renal artery duplex pending  -HD 2/18, 2/19  -strict i/o, daily weights, trend BMET   #Sepsis  -on Abx   #MSSA UTI  -on Abx   #Acute Encephalopathy  -Initially thought 2/2 severe sepsis and brain emboli presented 2/10. CT head with increasing edema in left parietal lobe.  -CTs and MRI this admission (see above)  -Neurology consulted  -ID consulted   #HYPOTHYROIDISM s/p thyroid cancer and thyroidectomy  -abnormal thyroid studies   Ref. Range  09/14/2013 12:52   TSH  Latest Range: 0.350-4.500 uIU/mL  10.174 (H)   Free T4  Latest Range: 0.80-1.80 ng/dL  0.84   T3, Total  Latest Range: 80.0-204.0 ng/dl  29.1 (L)   -will need repeat thyroid studies in the future  -Synthroid 100 mcg iv   #Hypertension  -h/o hypotension this admission  -held Lisinopril  -monitor BP   #H/o Anxiety/Depression/Insomina -h/o hospitalization  -held Zoloft, on Ambien   #Hyperglycemia  -improved  -HA1C 5.7  -SSI-S  -monitor cbgs   #Abdominal distension  -CT abdomen/pelvis negative 09/06/13. Negative C.diff  -2/18 Abd 1 view Xray concerned for SBO  -2/19 Ab 2 view with improvement  #FOBT + 09/16/13  -noted 2/19 coffee ground emesis x 3   #Malnutrition  -Albumin 1.6>1.4   #THC abuse  -Noted UDS 09/06/13   #F/E/N  -D5W 30 cc/hr  -will monitor and trend electrolytes (HyperNa/hyponatremia resolved, HypoK 3.6 today)  -diet as tolerated   #DVT px  -scds   Full code  PCP [name], MD)         LOS: 11 days   Cresenciano Genre, MD [phone] 09/19/2013, 8:43 AM  Patient seen and examined and history reviewed. Agree with above findings and plan. Stable from a  cardiac standpoint. Mental status is still waxing and waning. No fever. Systolic murmur noted on exam but no diastolic murmur. No arrhythmia. PR interval is normal. QT prolongation is unrelated to bacteremia/endocarditis. Still anticipate TEE at some point but would ideally like to wait until mental status improves given risk of sedation. Will reevaluate next week.  Luana Shu 09/19/2013 10:54 AM

## 2013-09-19 NOTE — Progress Notes (Signed)
Speech Language Pathology Treatment: Dysphagia  Patient Details Name: Bradley [name] MRN: [id number] DOB: 30-Jun-1956 Today's Date: 09/19/2013 Time: 1015-1040 SLP Time Calculation (min): 25 min  Assessment / Plan / Recommendation Clinical Impression  Pt with improved mentation today.  Followed for diet readiness - consumed regular solids and thin liquids without oral or pharyngeal deficits.  Appears to be protecting airway well.  No f/u for dysphagia warranted.    Pt is presenting with signs of a fluent, posterior aphasia with paraphasias, difficulty following commands, but preserved repetition.  Please order a speech-language evaluation, as well as PT and OT evals.   If pt is appropriate for CIR, he does have the family support necessary post-discharge (wife, adult son, and sister who lives nearby.)   HPI HPI: Bradley [name] admitted with altered mental status. Suspected bacterial endocarditis on a recently placed Bentall bioprosthetic device, Altered mental status/encephalopathy with evidence of cerebral emboli.  CXR 2/14; Cardiomegaly with central vascular congestion and persistent retrocardiac atelectasis or possibly early pneumonia. MRI 2/10; Multiple bilateral supra tentorial and right cerebellar regions of restricted motion some of which are partially hemorrhagic with largest area left parietal lobe. In the present clinical setting, these findings are consistent with acute partially hemorrhagic infarcts and possibly related to septic emboli.Hx of hypothyroidism, hypertension, and depression.      SLP Plan  No further swallowing f/u needed; please order language evaluation    Recommendations Diet recommendations: Regular;Thin liquid Liquids provided via: Straw Medication Administration: Whole meds with liquid Supervision: Patient able to self feed;Staff to assist with self feeding Compensations: Slow rate;Small sips/bites Postural Changes and/or Swallow Maneuvers: Seated upright 90 degrees             General recommendations: Rehab consult;OT consult;PT consult Oral Care Recommendations: Oral care BID Follow up Recommendations: Other (comment) (tba) Plan: Continue with current plan of care       Thor Nannini L. Tivis Ringer, [place] CCC/SLP Pager [phone]' Bradley [name] 09/19/2013, 10:Bradley AM

## 2013-09-19 NOTE — Progress Notes (Signed)
Patient ID: Bradley [name], male   DOB: February 12, 1956, 58 y.o.   MRN: [id number]         [facility]    Date of Admission:  09/08/2013    Total days of antibiotics 11         Principal Problem:   Sepsis Active Problems:   HYPOTHYROIDISM- TSH 7.5   Aortic valve replaced- Bentall propceedure 2011   Hypertension   Depression   LBBB (left bundle branch block)   Major depressive disorder, recurrent, severe with psychotic features- hospitalized in Jan 2013   Generalized anxiety disorder   Altered mental state   Encephalitis   Septic embolism   HCAP (healthcare-associated pneumonia)   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   Acute diastolic heart failure   NSTEMI (non-ST elevated myocardial infarction)   Agitation   Acute respiratory alkalosis   Acute renal failure   Encephalopathy acute   Staphylococcus aureus bacteremia with sepsis   ST elevation (STEMI) myocardial infarction   . antiseptic oral rinse  15 mL Mouth Rinse q12n4p  . chlorhexidine  15 mL Mouth Rinse BID  . famotidine (PEPCID) IV  20 mg Intravenous Q12H  . feeding supplement (ENSURE COMPLETE)  237 mL Oral Q24H  . feeding supplement (RESOURCE BREEZE)  1 Container Oral TID WC  . insulin aspart  0-9 Units Subcutaneous 6 times per day  . levothyroxine  100 mcg Intravenous Daily  . nafcillin IV  2 g Intravenous Q4H  . rifampin (RIFADIN) IVPB  300 mg Intravenous 3 times per day  . sodium chloride  3 mL Intravenous Q12H   Objective: Temp:  [97.6 F (36.4 C)-99.3 F (37.4 C)] 98.8 F (37.1 C) (02/20 1200) Pulse Rate:  [89-110] 100 (02/20 0200) Resp:  [19-28] 28 (02/20 0800) BP: (95-121)/(57-87) 121/87 mmHg (02/20 0800) SpO2:  [90 %-100 %] 95 % (02/20 0745) Weight:  [103.2 kg (227 lb 8.2 oz)] 103.2 kg (227 lb 8.2 oz) (02/20 0500)  General: He is alert and pleasantly confused. He states, "hello Legrand Como" when I entered the room Skin: No rash Lungs: Clear Cor: Regular [id number] 2/6 systolic murmur heard  best at the right sternal border Abdomen: Nontender   Lab Results Lab Results  Component Value Date   WBC 18.4* 09/19/2013   HGB 10.3* 09/19/2013   HCT 28.4* 09/19/2013   MCV 81.8 09/19/2013   PLT 214 09/19/2013    Lab Results  Component Value Date   CREATININE 7.79* 09/19/2013   BUN 64* 09/19/2013   NA 137 09/19/2013   K 3.6* 09/19/2013   CL 96 09/19/2013   CO2 22 09/19/2013    Lab Results  Component Value Date   ALT 22 09/19/2013   AST 24 09/19/2013   ALKPHOS 59 09/19/2013   BILITOT 1.7* 09/19/2013      Microbiology: Recent Results (from the past 240 hour(s))  CULTURE, BLOOD (ROUTINE X 2)     Status: None   Collection Time    09/10/13 11:05 AM      Result Value Ref Range Status   Specimen Description BLOOD RIGHT HAND   Final   Special Requests BOTTLES DRAWN AEROBIC ONLY 5CC   Final   Culture  Setup Time     Final   Value: 09/10/2013 14:22     Performed at [facility]   Culture     Final   Value: NO GROWTH 5 DAYS     Performed at [facility]   Report Status  09/16/2013 FINAL   Final  CULTURE, BLOOD (ROUTINE X 2)     Status: None   Collection Time    09/10/13 11:06 AM      Result Value Ref Range Status   Specimen Description BLOOD LEFT HAND   Final   Special Requests BOTTLES DRAWN AEROBIC AND ANAEROBIC 5CC   Final   Culture  Setup Time     Final   Value: 09/10/2013 14:27     Performed at [facility]   Culture     Final   Value: NO GROWTH 5 DAYS     Performed at [facility]   Report Status 09/16/2013 FINAL   Final  CLOSTRIDIUM DIFFICILE BY PCR     Status: None   Collection Time    09/15/13  2:00 AM      Result Value Ref Range Status   C difficile by pcr NEGATIVE  NEGATIVE Final   Comment: Performed at [facility]  MRSA PCR SCREENING     Status: None   Collection Time    09/15/13  5:31 PM      Result Value Ref Range Status   MRSA by PCR NEGATIVE  NEGATIVE Final   Comment:            The GeneXpert MRSA Assay (FDA      approved for NASAL specimens     only), is one component of a     comprehensive MRSA colonization     surveillance program. It is not     intended to diagnose MRSA     infection nor to guide or     monitor treatment for     MRSA infections.  CLOSTRIDIUM DIFFICILE BY PCR     Status: None   Collection Time    09/16/13 10:53 AM      Result Value Ref Range Status   C difficile by pcr NEGATIVE  NEGATIVE Final    Studies/Results: Dg Chest Port 1 View  09/18/2013   CLINICAL DATA:  Respiratory distress, shortness of breath.  EXAM: PORTABLE CHEST - 1 VIEW  COMPARISON:  09/17/2013  FINDINGS: Prior CABG and valve replacement. Left pass catheterization is in stable position. Cardiomegaly. Left lower lobe opacity, likely atelectasis. No effusions or overt edema.  IMPRESSION: Cardiomegaly.  Left lower lobe atelectasis.   Electronically Signed   By: Rolm Baptise M.D.   On: 09/18/2013 06:27   Dg Abd Portable 2v  09/18/2013   CLINICAL DATA:  58 year old male with abdominal distension. Possible small bowel obstruction. Initial encounter.  EXAM: PORTABLE ABDOMEN - 2 VIEW  COMPARISON:  09/17/2013 and earlier.  FINDINGS: Supine and portable cross-table lateral views of the abdomen. Decreased gaseous distension of small bowel. Numerous gas-filled mostly nondilated loops persist. Persistent right colon gas. No pneumoperitoneum identified. Mild motion artifact on both views. Stable visualized osseous structures.  IMPRESSION: Improved bowel gas pattern with decreased gaseous distension of small bowel and persistent colon gas. Pattern could reflect improving small bowel obstruction or ileus.  No free air identified.   Electronically Signed   By: Lars Pinks M.D.   On: 09/18/2013 17:50   EKG 09/19/2013: PR interval stable at 152 ms  Assessment: He is making some very slow progress on therapy for MSSA bacteremia.  Plan: 1. Continue nafcillin and rifampin 2. Please call Dr. Johnnye Sima [phone]) for any infectious  disease questions this weekend  Michel Bickers, MD [facility] [place] [facility] [phone] pager  [phone] cell 09/19/2013, 1:51 PM

## 2013-09-20 ENCOUNTER — Inpatient Hospital Stay (HOSPITAL_COMMUNITY): Payer: Medicaid Other

## 2013-09-20 LAB — CBC
HCT: 26.5 % — ABNORMAL LOW (ref 39.0–52.0)
Hemoglobin: 9.4 g/dL — ABNORMAL LOW (ref 13.0–17.0)
MCH: 28.7 pg (ref 26.0–34.0)
MCHC: 35.5 g/dL (ref 30.0–36.0)
MCV: 81 fL (ref 78.0–100.0)
Platelets: 244 10*3/uL (ref 150–400)
RBC: 3.27 MIL/uL — ABNORMAL LOW (ref 4.22–5.81)
RDW: 15.9 % — ABNORMAL HIGH (ref 11.5–15.5)
WBC: 15.7 10*3/uL — ABNORMAL HIGH (ref 4.0–10.5)

## 2013-09-20 LAB — COMPREHENSIVE METABOLIC PANEL
ALT: 19 U/L (ref 0–53)
AST: 24 U/L (ref 0–37)
Albumin: 1.3 g/dL — ABNORMAL LOW (ref 3.5–5.2)
Alkaline Phosphatase: 52 U/L (ref 39–117)
BUN: 71 mg/dL — ABNORMAL HIGH (ref 6–23)
CO2: 19 mEq/L (ref 19–32)
Calcium: 7.1 mg/dL — ABNORMAL LOW (ref 8.4–10.5)
Chloride: 93 mEq/L — ABNORMAL LOW (ref 96–112)
Creatinine, Ser: 9 mg/dL — ABNORMAL HIGH (ref 0.50–1.35)
GFR calc Af Amer: 7 mL/min — ABNORMAL LOW (ref >90)
GFR calc non Af Amer: 6 mL/min — ABNORMAL LOW (ref >90)
Glucose, Bld: 110 mg/dL — ABNORMAL HIGH (ref 70–99)
Potassium: 3.2 mEq/L — ABNORMAL LOW (ref 3.7–5.3)
Sodium: 133 mEq/L — ABNORMAL LOW (ref 137–147)
Total Bilirubin: 1.1 mg/dL (ref 0.3–1.2)
Total Protein: 5.3 g/dL — ABNORMAL LOW (ref 6.0–8.3)

## 2013-09-20 LAB — GLUCOSE, CAPILLARY
Glucose-Capillary: 101 mg/dL — ABNORMAL HIGH (ref 70–99)
Glucose-Capillary: 94 mg/dL (ref 70–99)
Glucose-Capillary: 73 mg/dL (ref 70–99)

## 2013-09-20 LAB — PROTIME-INR
INR: 1.39 (ref 0.00–1.49)
Prothrombin Time: 16.7 seconds — ABNORMAL HIGH (ref 11.6–15.2)

## 2013-09-20 MED ORDER — HEPARIN SODIUM (PORCINE) 1000 UNIT/ML IJ SOLN
INTRAMUSCULAR | Status: AC
  Filled 2013-09-20: qty 1

## 2013-09-20 NOTE — Progress Notes (Addendum)
Bradley [name] 1 - Stepdown / ICU Progress Note  [name] [id number] DOB: [birth year] DOA: 09/08/2013 PCP: [name], MD  Brief narrative: 58 year old male with history of aortic stenosis, status post pediatric aortic valvulotomy at age 39 followed by tissue AVR (Bentall procedure) 02/2000 with cardiac cath 02/2010 with normal coronaries, LBBB, thyroid cancer post thyroidectomy >> hypothyroidism, HTN, and depression who had been generally unwell for 2-3 weeks.  He was seen in the ED on 09/06/13 and assessed as influenza related vomiting, diarrhea, abdominal pain, body aches, fever and mild confusion. Family was given option of hospitalization versus discharge home - family opted to go home and he was discharged on Tamiflu. Patient worsened at home with progressive confusion/disorientation and was admitted with sepsis, septic brain emboli and rule out infective endocarditis.  SIGNIFICANT EVENTS / STUDIES:  2/09 - admit  2/10 - ID, Heme and Cards consult. TTE EF 45%. No clear vegetations  2/14 - PCCM consult  2/15 - Junctional rhythm with precedex, mental status improved. Transferred to [facility] for possible HD  2/18 - transferred to ICU for diaphoresis, ST changes and cofusion  LINES / TUBES:  Left Midline 09/13/13>>  L IJ HD catheter 2/18 >> pt pulled out 2/21  HPI/Subjective: Pt is alert and conversant, but confused.  He is unable to provide a reliable ROS.  He pulled out his HD cath this morning.    Assessment/Plan:  Sepsis with MSSA bacteremia / infective endocarditis w/ cerebral septic emboli / UTI -Blood cultures x2 MSSA -Chest x-ray suggested early pneumonia of left lung base with small left pleural effusion -UA cx with MSSA UTI -Transthoracic echo (2/10) without vegetation - TEE planned when other medical issues stabilized - has prior tissue AVR -ID directing abx  Metabolic encephalopathy -secondary to severe sepsis, azotemia, and septic brain emboli -MRI Brain (2/10)  with multiple bilateral supra tentorial and right cerebellar regions c/w acute partially hemorrhagic infarcts suspected 2/2 septic emboli -Continue supportive care and tx underlying causes -Neurology following  -QTc prolonged so haldol not safe   Severe thrombocytopenia  -Platelet counts dropped to a nadir of 23 - felt to be due to sepsis - now resolved - Hematology has seen   Acute Renal Failure/Acute tubular necrosis -due to bacteremia/immune complex or gentamicin -baseline renal fnx: 18 / 0.93 - progressive since admit - Nephrology following -Transferred to [name] 09/15/2013 in anticipation of likely hemodialysis -Hemodialysis started 09/17/2013 -Renal US WNL - Renal artery duplex pending  Abdominal distention/diarrhea -much improved at this time - f/u KUB confirmed improved bowel gas pattern   Hypokalemia  - carefully replete prn  NSTEMI (Type II) -peak TNI 2.26 - No plans for cath at this point - has stable ST elevation r/t LBBB  Acute Systolic HF and Chronic grade 1 Diastolic CHF  -ECHO Oct [id number] with EF 60-65% with severe LVH and no RWMA -ECHO Sep 09, 2013 EF 45-50% with septal and apical hypokinesis  History of thyroid cancer, post thyroidectomy hypothyroidism  -TSH initially 7.675 on 2/10 and further increased to 10.174 - Synthroid increased - repeat labs in 4-6 weeks  Hypertension  -Continue when necessary IV metoprolol - Holding lisinopril due to acute renal failure  Hyperglycemia  -Hemoglobin A1c = 5.7 - follow trend   History of depression  -hold Zoloft secondary to acute encephalopathy.   Hyponatremia  -due to volume overload - ongoing HD should correct   DVT prophylaxis: SCDs Code Status: Full Family Communication: No family at bedside  Disposition:  SDU  Consultants: Cardiology PCCM Nephrology Neurology Infectious disease  Antibiotics: Nafcillin 2/10>> Rifampin 2/15 >> Vancomycin 2/10>>2/11 Gentamicin 2/10>> 2/13   Objective: Blood  pressure 136/88, pulse 100, temperature 99.2 F (37.3 C), temperature source Oral, resp. rate 20, height 6\' 1"  (1.854 m), weight 104.6 kg (230 lb 9.6 oz), SpO2 96.00%.  Intake/Output Summary (Last 24 hours) at 09/20/13 1010 Last data filed at 09/19/13 1600  Gross per 24 hour  Intake    450 ml  Output      0 ml  Net    450 ml   Exam: General: No acute respiratory distress - alert but confused  Lungs: Clear to auscultation bilaterally without wheezes or crackles, RA Cardiovascular: Regular rate and rhythm with systolic murmur over aortic region without gallop or rub, no peripheral edema or JVD Abdomen: Nontender, nondistended, soft, bowel sounds positive, no rebound, no ascites, no appreciable mass Musculoskeletal: No significant cyanosis, clubbing of bilateral lower extremities Neurological: Alert but not oriented  Scheduled Meds:  Scheduled Meds: . antiseptic oral rinse  15 mL Mouth Rinse q12n4p  . chlorhexidine  15 mL Mouth Rinse BID  . feeding supplement (RESOURCE BREEZE)  1 Container Oral TID WC  . insulin aspart  0-9 Units Subcutaneous TID WC  . levothyroxine  200 mcg Oral QAC breakfast  . nafcillin IV  2 g Intravenous Q4H  . pantoprazole  40 mg Oral Q1200  . rifampin (RIFADIN) IVPB  300 mg Intravenous 3 times per day  . sodium chloride  3 mL Intravenous Q12H   Data Reviewed: Basic Metabolic Panel:  Recent Labs Lab 09/14/13 0445  09/15/13 0341 09/16/13 0335 09/17/13 0524 09/18/13 0330 09/19/13 0455 09/20/13 0555  NA 141  --  139 137 131* 135* 137 133*  K 2.9*  < > 3.5* 4.0 3.7 4.0 3.6* 3.2*  CL 103  --  101 98 92* 93* 96 93*  CO2 21  --  19 17* 15* 20 22 19   GLUCOSE 158*  --  131* 127* 140* 132* 118* 110*  BUN 56*  --  67* 81* 90* 72* 64* 71*  CREATININE 3.89*  --  5.78* 7.60* 8.89* 7.87* 7.79* 9.00*  CALCIUM 7.4*  --  7.5* 7.0* 7.5* 7.1* 7.1* 7.1*  MG 2.3  --  2.4 2.6* 2.8*  --  2.2  --   PHOS 2.6  --  4.5 6.0* 7.4*  --  6.7*  --   < > = values in this  interval not displayed.  Liver Function Tests:  Recent Labs Lab 09/16/13 0335 09/17/13 0524 09/18/13 0330 09/19/13 0455 09/20/13 0555  AST 26 35 33 24 24  ALT 22 29 29 22 19   ALKPHOS 86 91 73 59 52  BILITOT 1.7* 1.7* 1.7* 1.7* 1.1  PROT 5.7* 6.8 6.1 5.6* 5.3*  ALBUMIN 1.5* 1.6* 1.4* 1.4* 1.3*   CBC:  Recent Labs Lab 09/16/13 0335 09/17/13 0524 09/17/13 1447 09/18/13 0330 09/19/13 0455 09/20/13 0555  WBC 21.8* 26.2* 24.7* 23.9* 18.4* 15.7*  NEUTROABS 19.8* 24.1* 23.0* 22.0* 16.9*  --   HGB 12.9* 14.8 12.3* 12.7* 10.3* 9.4*  HCT 35.0* 40.9 33.6* 34.5* 28.4* 26.5*  MCV 82.2 82.8 81.4 80.8 81.8 81.0  PLT 92* 165 145* 205 214 244   Cardiac Enzymes:  Recent Labs Lab 09/17/13 1231 09/17/13 1830  TROPONINI 0.38* 0.36*   CBG:  Recent Labs Lab 09/19/13 0732 09/19/13 1223 09/19/13 1626 09/19/13 2140 09/20/13 0819  GLUCAP 113* 200* 141* 132* 101*  Recent Results (from the past 240 hour(s))  CULTURE, BLOOD (ROUTINE X 2)     Status: None   Collection Time    09/10/13 11:05 AM      Result Value Ref Range Status   Specimen Description BLOOD RIGHT HAND   Final   Special Requests BOTTLES DRAWN AEROBIC ONLY 5CC   Final   Culture  Setup Time     Final   Value: 09/10/2013 14:22     Performed at [facility]   Culture     Final   Value: NO GROWTH 5 DAYS     Performed at [facility]   Report Status 09/16/2013 FINAL   Final  CULTURE, BLOOD (ROUTINE X 2)     Status: None   Collection Time    09/10/13 11:06 AM      Result Value Ref Range Status   Specimen Description BLOOD LEFT HAND   Final   Special Requests BOTTLES DRAWN AEROBIC AND ANAEROBIC 5CC   Final   Culture  Setup Time     Final   Value: 09/10/2013 14:27     Performed at [facility]   Culture     Final   Value: NO GROWTH 5 DAYS     Performed at [facility]   Report Status 09/16/2013 FINAL   Final  CLOSTRIDIUM DIFFICILE BY PCR     Status: None   Collection Time     09/15/13  2:00 AM      Result Value Ref Range Status   C difficile by pcr NEGATIVE  NEGATIVE Final   Comment: Performed at [facility]  MRSA PCR SCREENING     Status: None   Collection Time    09/15/13  5:31 PM      Result Value Ref Range Status   MRSA by PCR NEGATIVE  NEGATIVE Final   Comment:            The GeneXpert MRSA Assay (FDA     approved for NASAL specimens     only), is one component of a     comprehensive MRSA colonization     surveillance program. It is not     intended to diagnose MRSA     infection nor to guide or     monitor treatment for     MRSA infections.  CLOSTRIDIUM DIFFICILE BY PCR     Status: None   Collection Time    09/16/13 10:53 AM      Result Value Ref Range Status   C difficile by pcr NEGATIVE  NEGATIVE Final     Studies:  Recent x-ray studies have been reviewed in detail by the Attending Physician  Time spent : 35 mins  Cherene Altes, MD [facility] For Consults/Admissions - Flow Manager - [phone] Office  [phone] Pager [phone]  On-Call/Text Page:      [email or web address]      password [facility]  09/20/2013, 10:10 AM   LOS: 12 days

## 2013-09-20 NOTE — Progress Notes (Signed)
Assessment/Plan:  1 Oliguric AKI -worsening, due to bacteremia/immune complex or gentamicin. [name] has been stopped.  2 Staph aureus bacteremia / embolic strokes- suspected endocarditis  3 Congenital AoV disease, hx of tissue AoV replacement 2011  4 Encephalopathy  5 Pos Troponin  Plan: Will ask IR to place tunneled dialysis catheter.  Will proceed with dialysis again Saturday or Sunday after placement. No heparin   Subjective: Interval History: He pulled out his dialysis catheter  Objective: Vital signs in last 24 hours: Temp:  [98.5 F (36.9 C)-100.1 F (37.8 C)] 99.2 F (37.3 C) (02/21 0814) Pulse Rate:  [100] 100 (02/20 2000) Resp:  [20-28] 20 (02/21 0814) BP: (117-142)/(75-89) 136/88 mmHg (02/21 0814) SpO2:  [93 %-96 %] 96 % (02/21 0814) Weight:  [104.6 kg (230 lb 9.6 oz)] 104.6 kg (230 lb 9.6 oz) (02/21 0500) Weight change: 1.3 kg (2 lb 13.9 oz)  Intake/Output from previous day: 02/20 0701 - 02/21 0700 In: 630 [P.O.:240; I.V.:240; IV Piggyback:150] Out: -  Intake/Output this shift:    General appearance: alert, slowed mentation and confused Chest wall: no tenderness Cardio: regular rate and rhythm, S1, S2 normal, no murmur, click, rub or gallop and audible mechanical sounds Extremities: 1+ edema  Lab Results:  Recent Labs  09/19/13 0455 09/20/13 0555  WBC 18.4* 15.7*  HGB 10.3* 9.4*  HCT 28.4* 26.5*  PLT 214 244   BMET:  Recent Labs  09/19/13 0455 09/20/13 0555  NA 137 133*  K 3.6* 3.2*  CL 96 93*  CO2 22 19  GLUCOSE 118* 110*  BUN 64* 71*  CREATININE 7.79* 9.00*  CALCIUM 7.1* 7.1*   No results found for this basename: PTH,  in the last 72 hours [place] Studies: No results found for this basename: IRON, TIBC, TRANSFERRIN, FERRITIN,  in the last 72 hours Studies/Results: Dg Abd Portable 2v  09/18/2013   CLINICAL DATA:  58 year old male with abdominal distension. Possible small bowel obstruction. Initial encounter.  EXAM: PORTABLE ABDOMEN - 2 VIEW   COMPARISON:  09/17/2013 and earlier.  FINDINGS: Supine and portable cross-table lateral views of the abdomen. Decreased gaseous distension of small bowel. Numerous gas-filled mostly nondilated loops persist. Persistent right colon gas. No pneumoperitoneum identified. Mild motion artifact on both views. Stable visualized osseous structures.  IMPRESSION: Improved bowel gas pattern with decreased gaseous distension of small bowel and persistent colon gas. Pattern could reflect improving small bowel obstruction or ileus.  No free air identified.   Electronically Signed   By: Lars Pinks M.D.   On: 09/18/2013 17:50    Scheduled: . antiseptic oral rinse  15 mL Mouth Rinse q12n4p  . chlorhexidine  15 mL Mouth Rinse BID  . feeding supplement (RESOURCE BREEZE)  1 Container Oral TID WC  . insulin aspart  0-9 Units Subcutaneous TID WC  . levothyroxine  200 mcg Oral QAC breakfast  . nafcillin IV  2 g Intravenous Q4H  . pantoprazole  40 mg Oral Q1200  . rifampin (RIFADIN) IVPB  300 mg Intravenous 3 times per day  . sodium chloride  3 mL Intravenous Q12H     LOS: 12 days   Taunya Goral C 09/20/2013,10:29 AM

## 2013-09-20 NOTE — Progress Notes (Signed)
PT Cancellation Note  Patient Details Name: Bradley [name] MRN: [id number] DOB: 1956/06/10   Cancelled Treatment:    Reason Eval/Treat Not Completed: Patient at procedure or test/unavailable (pt to transfer to IR at anytime and RN request hold due to pending procedure)   Melford Aase 09/20/2013, 12:17 PM Elwyn Reach, [place]

## 2013-09-20 NOTE — Progress Notes (Addendum)
Telephone order received  by Dr Florene Glen to emergently place catheter for hemodialysis and IV med purposes .Family was left message to call [phone] on home phone . Voice mail has not been set up for cell phone.

## 2013-09-20 NOTE — Progress Notes (Signed)
Dr Florene Glen informed that Pt was found at change of shift w/H.D. Cath pulled out (lying beside him on bed) . Pt currently NPO .

## 2013-09-20 NOTE — Progress Notes (Signed)
Subjective: Pt denies complaints this am.  No SOB or chest pain. Confused; alert and oriented x 1. Pulled out vasc cath.   Objective: Vital signs in last 24 hours: Filed Vitals:   09/20/13 0020 09/20/13 0500 09/20/13 0540 09/20/13 0814  BP: 125/75  133/78 136/88  Pulse:      Temp:   99.1 F (37.3 C) 99.2 F (37.3 C)  TempSrc:   Oral Oral  Resp: 22   20  Height:      Weight:  230 lb 9.6 oz (104.6 kg)    SpO2:    96%   Weight change: 2 lb 13.9 oz (1.3 kg)  Intake/Output Summary (Last 24 hours) at 09/20/13 0835 Last data filed at 09/19/13 1600  Gross per 24 hour  Intake    600 ml  Output      0 ml  Net    600 ml   Vitals reviewed. General: resting in bed, NAD HEENT: normal Cardiac: slightly tachycardic, 2/6 systolic murmur, no DM Pulm: clear to auscultation bilaterally anteriorly  Abd: soft, nontender, mildly distended, BS present, obese Ext: no pedal edema Neuro: decreased strength LUE and LLE Lab Results: Basic Metabolic Panel:  Recent Labs Lab 09/17/13 0524  09/19/13 0455 09/20/13 0555  NA 131*  < > 137 133*  K 3.7  < > 3.6* 3.2*  CL 92*  < > 96 93*  CO2 15*  < > 22 19  GLUCOSE 140*  < > 118* 110*  BUN 90*  < > 64* 71*  CREATININE 8.89*  < > 7.79* 9.00*  CALCIUM 7.5*  < > 7.1* 7.1*  MG 2.8*  --  2.2  --   PHOS 7.4*  --  6.7*  --   < > = values in this interval not displayed. Liver Function Tests:  Recent Labs Lab 09/19/13 0455 09/20/13 0555  AST 24 24  ALT 22 19  ALKPHOS 59 52  BILITOT 1.7* 1.1  PROT 5.6* 5.3*  ALBUMIN 1.4* 1.3*   CBC:  Recent Labs Lab 09/18/13 0330 09/19/13 0455 09/20/13 0555  WBC 23.9* 18.4* 15.7*  NEUTROABS 22.0* 16.9*  --   HGB 12.7* 10.3* 9.4*  HCT 34.5* 28.4* 26.5*  MCV 80.8 81.8 81.0  PLT 205 214 244   Cardiac Enzymes:  Recent Labs Lab 09/17/13 1231 09/17/13 1830  TROPONINI 0.38* 0.36*   BNP:  Recent Labs Lab 09/14/13 0445  PROBNP >70000.0*   CBG:  Recent Labs Lab 09/19/13 0444  09/19/13 0732 09/19/13 1223 09/19/13 1626 09/19/13 2140 09/20/13 0819  GLUCAP 108* 113* 200* 141* 132* 101*   Thyroid Function Tests:  Recent Labs Lab 09/14/13 1252  TSH 10.174*  FREET4 0.84   Coagulation:  Recent Labs Lab 09/17/13 0343 09/18/13 0330 09/19/13 0455 09/20/13 0555  LABPROT 15.2 16.2* 17.5* 16.7*  INR 1.23 1.33 1.48 1.39   Urine Drug Screen: Drugs of Abuse     Component Value Date/Time   LABOPIA NONE DETECTED 09/06/2013 2237   COCAINSCRNUR NONE DETECTED 09/06/2013 2237   LABBENZ NONE DETECTED 09/06/2013 2237   AMPHETMU NONE DETECTED 09/06/2013 2237   THCU POSITIVE* 09/06/2013 2237   LABBARB NONE DETECTED 09/06/2013 2237    Misc. Labs: none  Micro Results: Recent Results (from the past 240 hour(s))  CULTURE, BLOOD (ROUTINE X 2)     Status: None   Collection Time    09/10/13 11:05 AM      Result Value Ref Range Status   Specimen Description BLOOD RIGHT  HAND   Final   Special Requests BOTTLES DRAWN AEROBIC ONLY 5CC   Final   Culture  Setup Time     Final   Value: 09/10/2013 14:22     Performed at [facility]   Culture     Final   Value: NO GROWTH 5 DAYS     Performed at [facility]   Report Status 09/16/2013 FINAL   Final  CULTURE, BLOOD (ROUTINE X 2)     Status: None   Collection Time    09/10/13 11:06 AM      Result Value Ref Range Status   Specimen Description BLOOD LEFT HAND   Final   Special Requests BOTTLES DRAWN AEROBIC AND ANAEROBIC 5CC   Final   Culture  Setup Time     Final   Value: 09/10/2013 14:27     Performed at [facility]   Culture     Final   Value: NO GROWTH 5 DAYS     Performed at [facility]   Report Status 09/16/2013 FINAL   Final  CLOSTRIDIUM DIFFICILE BY PCR     Status: None   Collection Time    09/15/13  2:00 AM      Result Value Ref Range Status   C difficile by pcr NEGATIVE  NEGATIVE Final   Comment: Performed at [facility]  MRSA PCR SCREENING     Status: None   Collection  Time    09/15/13  5:31 PM      Result Value Ref Range Status   MRSA by PCR NEGATIVE  NEGATIVE Final   Comment:            The GeneXpert MRSA Assay (FDA     approved for NASAL specimens     only), is one component of a     comprehensive MRSA colonization     surveillance program. It is not     intended to diagnose MRSA     infection nor to guide or     monitor treatment for     MRSA infections.  CLOSTRIDIUM DIFFICILE BY PCR     Status: None   Collection Time    09/16/13 10:53 AM      Result Value Ref Range Status   C difficile by pcr NEGATIVE  NEGATIVE Final   Studies/Results: Dg Abd Portable 2v  09/18/2013   CLINICAL DATA:  58 year old male with abdominal distension. Possible small bowel obstruction. Initial encounter.  EXAM: PORTABLE ABDOMEN - 2 VIEW  COMPARISON:  09/17/2013 and earlier.  FINDINGS: Supine and portable cross-table lateral views of the abdomen. Decreased gaseous distension of small bowel. Numerous gas-filled mostly nondilated loops persist. Persistent right colon gas. No pneumoperitoneum identified. Mild motion artifact on both views. Stable visualized osseous structures.  IMPRESSION: Improved bowel gas pattern with decreased gaseous distension of small bowel and persistent colon gas. Pattern could reflect improving small bowel obstruction or ileus.  No free air identified.   Electronically Signed   By: Lars Pinks M.D.   On: 09/18/2013 17:50   Medications:  Medications Prior to Admission  Medication Sig Dispense Refill  . acetaminophen (TYLENOL) 500 MG tablet Take 500 mg by mouth every 6 (six) hours as needed for mild pain or fever.      Marland Kitchen aspirin 325 MG tablet Take 325 mg by mouth daily.        Marland Kitchen ibuprofen (ADVIL,MOTRIN) 200 MG tablet Take 200 mg by mouth every 6 (six)  hours as needed for fever.      . levothyroxine (SYNTHROID, LEVOTHROID) 175 MCG tablet Take 175 mcg by mouth daily.      Marland Kitchen lisinopril (PRINIVIL,ZESTRIL) 10 MG tablet Take 1 tablet (10 mg total) by mouth  daily.  30 tablet  11  . metoprolol (LOPRESSOR) 50 MG tablet Take 25 mg by mouth 2 (two) times daily.      . multivitamin (THERAGRAN) per tablet Take 1 tablet by mouth daily.        Marland Kitchen oseltamivir (TAMIFLU) 75 MG capsule Take 1 capsule (75 mg total) by mouth every 12 (twelve) hours.  10 capsule  0  . sertraline (ZOLOFT) 50 MG tablet Take 50 mg by mouth daily.       Scheduled Meds: . antiseptic oral rinse  15 mL Mouth Rinse q12n4p  . chlorhexidine  15 mL Mouth Rinse BID  . feeding supplement (RESOURCE BREEZE)  1 Container Oral TID WC  . insulin aspart  0-9 Units Subcutaneous TID WC  . levothyroxine  200 mcg Oral QAC breakfast  . nafcillin IV  2 g Intravenous Q4H  . pantoprazole  40 mg Oral Q1200  . rifampin (RIFADIN) IVPB  300 mg Intravenous 3 times per day  . sodium chloride  3 mL Intravenous Q12H   Continuous Infusions:   PRN Meds:.sodium chloride, sodium chloride, acetaminophen, acetaminophen, feeding supplement (NEPRO CARB STEADY), heparin, lidocaine (PF), lidocaine-prilocaine, liver oil-zinc oxide, metoprolol, morphine injection, ondansetron (ZOFRAN) IV, pentafluoroprop-tetrafluoroeth, zolpidem 09/09/13 echo Left ventricle: septal and apical hypokinesis The cavity size was mildly dilated. Wall thickness was increased in a pattern of moderate LVH. Systolic function was mildly reduced. The estimated ejection fraction was in the range of 45% to 50%.  ------------------------------------------------------------ Aortic valve: Apparent tissue AVR. No abscess or vegetation seen. No AR or perivalvular leak and normal systolic gradients Suggest TEE if clinical suspicion for SBE high Doppler: Peak velocity ratio of LVOT to aortic valve: 0.32. Mean velocity ratio of LVOT to aortic valve: 0.34. Mean gradient: 67mm Hg (S). Peak gradient: 19mm Hg (S).  ------------------------------------------------------------ Mitral valve: Mildly thickened leaflets . Doppler:  Mild regurgitation.  ------------------------------------------------------------ Left atrium: The atrium was mildly dilated.  ------------------------------------------------------------ Atrial septum: Poorly visualized.  ------------------------------------------------------------ Right ventricle: The cavity size was normal. Wall thickness was normal. Systolic function was normal.  ------------------------------------------------------------ Pulmonic valve: Poorly visualized. The valve appears to be grossly normal.  ------------------------------------------------------------ Tricuspid valve: Poorly visualized. Doppler: Mild regurgitation.  ------------------------------------------------------------ Right atrium: The atrium was normal in size.  ------------------------------------------------------------ Pericardium: The pericardium was normal in appearance.   Assessment/Plan: 58 y.o male with complicated medication history. 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 This admission since 2/9 has been complicated with multiple medical issues (below). He was transferred to [facility] from [facility] on 2/16 in case he needed HD for worsening renal function. He was transferred from SDU to cardiac ICU 2/18 due to EKG changes, diaphoresis and sob (now resolved).   #MSSA bacteremia with concern for infective endocarditis s/p AVR  -+cultures 09/08/13 -ID rec tx x 6 weeks  -Now pt only on Nafcillin, Rifampin  -Plan TEE when mental status and confusion improves, hopefully next week.  #NSTEMI (non-ST elevated myocardial infarction) type 2  -likely 2/2 demand ischemia   Ref. Range  09/10/2013 07:53  09/10/2013 15:00  09/10/2013 23:15  09/12/2013 09:13   09/12/2013 14:19  09/12/2013 17:30  09/12/2013 20:10   Troponin I  Latest Range: <0.30 ng/mL  2.26 (HH)  2.05 (HH)  1.78 (HH)  1.52 (HH)  1.54 (HH)   1.39 (HH)     Ref. Range  09/17/2013 12:31  09/17/2013 18:30   Troponin I  Latest Range: <0.30 ng/mL  0.38 (HH)  0.36 (HH)      #Septic emboli/hemmorhage with encephalitis  -noted on CT head  -Neuro, ID following  -at some point this admission associated with left hemiparesis.   #Acute vs chronic diastolic heart failure  -concerned for this admission but appear improved. -echo 2/10 with EF 45-50% h/o grade 1 diastolic dysfunction noted 04/2013   Ref. Range  09/10/2013 07:53  09/10/2013 15:00  09/10/2013 23:15  09/12/2013 09:13  09/12/2013 14:19  09/12/2013 17:30  09/12/2013 20:10  09/14/2013 04:45   Pro B Natriuretic peptide (BNP)  Latest Range: 0.0-100.0 pg/mL  37349.0 (H)        >70000.0 (H)     #Acute renal failure  -Could be ATN 2/2 bacteremia/immune complex thought less likely d/t medications. Gentamycin stopped.  -Nephro consulted and following  -renal US wnl, renal artery duplex pending   #Sepsis  -on Abx   #MSSA UTI  -on Abx   #Acute Encephalopathy   #HYPOTHYROIDISM s/p thyroid cancer and thyroidectomy  -abnormal thyroid studies   Ref. Range  09/14/2013 12:52   TSH  Latest Range: 0.350-4.500 uIU/mL  10.174 (H)   Free T4  Latest Range: 0.80-1.80 ng/dL  0.84   T3, Total  Latest Range: 80.0-204.0 ng/dl  29.1 (L)   -will need repeat thyroid studies in the future   Full code  PCP [name], MD)    Ellis Parents 09/20/2013 8:35 AM

## 2013-09-21 LAB — GLUCOSE, CAPILLARY
Glucose-Capillary: 142 mg/dL — ABNORMAL HIGH (ref 70–99)
Glucose-Capillary: 124 mg/dL — ABNORMAL HIGH (ref 70–99)
Glucose-Capillary: 156 mg/dL — ABNORMAL HIGH (ref 70–99)
Glucose-Capillary: 156 mg/dL — ABNORMAL HIGH (ref 70–99)

## 2013-09-21 LAB — PROTIME-INR
INR: 1.51 — ABNORMAL HIGH (ref 0.00–1.49)
Prothrombin Time: 17.8 seconds — ABNORMAL HIGH (ref 11.6–15.2)

## 2013-09-21 MED ORDER — SEVELAMER CARBONATE 800 MG PO TABS
800.0000 mg | ORAL_TABLET | Freq: Three times a day (TID) | ORAL | Status: DC
  Administered 2013-09-21 – 2013-09-23 (×5): 800 mg via ORAL
  Filled 2013-09-21 (×9): qty 1

## 2013-09-21 MED ORDER — HEPARIN SODIUM (PORCINE) 5000 UNIT/ML IJ SOLN
5000.0000 [IU] | Freq: Three times a day (TID) | INTRAMUSCULAR | Status: DC
  Administered 2013-09-21 – 2013-10-09 (×48): 5000 [IU] via SUBCUTANEOUS
  Filled 2013-09-21 (×60): qty 1

## 2013-09-21 MED ORDER — HYDROXYZINE HCL 25 MG PO TABS
25.0000 mg | ORAL_TABLET | Freq: Three times a day (TID) | ORAL | Status: DC | PRN
  Administered 2013-09-21 – 2013-10-05 (×4): 25 mg via ORAL
  Filled 2013-09-21 (×5): qty 1

## 2013-09-21 MED ORDER — ZOLPIDEM TARTRATE 5 MG PO TABS
5.0000 mg | ORAL_TABLET | Freq: Every evening | ORAL | Status: DC | PRN
  Administered 2013-09-25 – 2013-09-27 (×4): 5 mg via ORAL
  Filled 2013-09-21 (×4): qty 1

## 2013-09-21 NOTE — Progress Notes (Signed)
Assessment/Plan:  1 Oliguric AKI -worsening, due to bacteremia/immune complex or              gentamicin. [name] has been stopped.  2 Staph aureus bacteremia / embolic strokes- suspected endocarditis  3 Congenital AoV disease, hx of tissue AoV replacement 2011  4 Encephalopathy  5 Pos Troponin  6 Diarrhea ?antibiotic associated  Plan:  HD Monday.  IR did not want to put in tunneled cath due to co morbidities s/p vascath insertion instead  Subjective: Interval History: Still confused.  HD yesterday -1500cc Objective: Vital signs in last 24 hours: Temp:  [98.6 F (37 C)-100.5 F (38.1 C)] 100.1 F (37.8 C) (02/22 0741) Pulse Rate:  [42-118] 118 (02/21 2300) Resp:  [16-30] 20 (02/22 0741) BP: (120-150)/(44-101) 128/79 mmHg (02/22 0741) SpO2:  [95 %-100 %] 95 % (02/22 0741) Weight:  [102.9 kg (226 lb 13.7 oz)-106.9 kg (235 lb 10.8 oz)] 102.9 kg (226 lb 13.7 oz) (02/22 0336) Weight change: 2.3 kg (5 lb 1.1 oz)  Intake/Output from previous day: 02/21 0701 - 02/22 0700 In: 1230 [P.O.:480; IV Piggyback:750] Out: 1500  Intake/Output this shift: Total I/O In: 240 [P.O.:240] Out: -   General appearance: alert and confused, some appropriate responses that degenerate with repeated questioning Resp: clear to auscultation bilaterally Chest wall: no tenderness Extremities: edema tr Agitated, hanging legs over bed Skin ? Early rash  Lab Results:  Recent Labs  09/19/13 0455 09/20/13 0555  WBC 18.4* 15.7*  HGB 10.3* 9.4*  HCT 28.4* 26.5*  PLT 214 244   BMET:  Recent Labs  09/19/13 0455 09/20/13 0555  NA 137 133*  K 3.6* 3.2*  CL 96 93*  CO2 22 19  GLUCOSE 118* 110*  BUN 64* 71*  CREATININE 7.79* 9.00*  CALCIUM 7.1* 7.1*   No results found for this basename: PTH,  in the last 72 hours Iron Studies: No results found for this basename: IRON, TIBC, TRANSFERRIN, FERRITIN,  in the last 72 hours Studies/Results: No results found.  Scheduled: . antiseptic oral rinse  15 mL  Mouth Rinse q12n4p  . chlorhexidine  15 mL Mouth Rinse BID  . feeding supplement (RESOURCE BREEZE)  1 Container Oral TID WC  . heparin subcutaneous  5,000 Units Subcutaneous 3 times per day  . insulin aspart  0-9 Units Subcutaneous TID WC  . levothyroxine  200 mcg Oral QAC breakfast  . nafcillin IV  2 g Intravenous Q4H  . pantoprazole  40 mg Oral Q1200  . rifampin (RIFADIN) IVPB  300 mg Intravenous 3 times per day  . sodium chloride  3 mL Intravenous Q12H    LOS: 13 days   Acheron Sugg C 09/21/2013,11:49 AM

## 2013-09-21 NOTE — Progress Notes (Addendum)
[name]  FREDDY DISTEFANO [id number] DOB: 05/29/1956 DOA: 09/08/2013 PCP: [name], MD  Brief narrative: 58 year old male with history of aortic stenosis, status post pediatric aortic valvulotomy at age 76 followed by tissue AVR (Bentall procedure) 02/2000 with cardiac cath 02/2010 with normal coronaries, chronic LBBB, thyroid cancer post thyroidectomy >> hypothyroidism, HTN, and depression, who had been generally unwell for 2-3 weeks.  He was seen in the ED on 09/06/13 and assessed as influenza related vomiting, diarrhea, abdominal pain, body aches, fever and mild confusion and was ultimately discharged on Tamiflu. Patient worsened at home with progressive confusion/disorientation and was admitted with sepsis, septic brain emboli and rule out infective endocarditis.  SIGNIFICANT EVENTS / STUDIES:  2/09 - admit  2/10 - ID, Heme and Cards consults. TTE EF 45%. No clear vegetations  2/14 - PCCM consult  2/15 - Junctional rhythm with precedex, mental status improved. Transferred to [facility] for possible HD  2/18 - transferred to ICU for diaphoresis, ST changes and confusion  LINES / TUBES:  Left Midline 09/13/13>>  L IJ HD catheter 2/18 >> pt pulled out 2/21 L [place] HD catheter 2/21  HPI/Subjective: Pt remains alert and conversant, but is still confused.  He is unable to provide a reliable ROS.  A new L subclavian HD cath has been placed.     Assessment/Plan:  MSSA bacteremia w/ sepsis / infective endocarditis w/ cerebral septic emboli / UTI -Blood cultures x 2 MSSA - f/u cultures negative on abx -Chest x-ray suggested early pneumonia of left lung base with small left pleural effusion -UA cx with MSSA UTI -TTE (2/10) without vegetation - TEE planned when other medical issues stabilized - has prior tissue AVR -ID directing abx  Toxic metabolic encephalopathy -secondary to severe sepsis, azotemia, and septic brain emboli -MRI Brain (2/10) with  multiple bilateral supra tentorial and right cerebellar regions c/w acute partially hemorrhagic infarcts suspected 2/2 septic emboli -Continue supportive care and tx underlying causes -Neurology has evaluated -QTc prolonged so haldol not safe  -check 123456, folic acid, and ammonia for completeness   Severe thrombocytopenia  -Platelet counts dropped to a nadir of 23 - felt to be due to sepsis - now resolved - Hematology has seen   Acute Renal Failure/Acute tubular necrosis -due to bacteremia/immune complex or gentamicin -baseline renal fnx: 18 / 0.93 - progressive failure since admit - Nephrology following -Transferred to [name] 09/15/2013 for hemodialysis -Hemodialysis started 09/17/2013 -Renal US WNL   Abdominal distention/diarrhea -distention improved, but diarrhea continues - flexiseal as pt has been "playing in his stool" - diarrhea likely due to abx - check C diff negative 2/16 and 2/17   Hypokalemia  - carefully replete prn  NSTEMI (Type II) -peak TNI 2.26 - no plans for cath at this point - has stable ST elevation r/t LBBB  Acute Systolic HF and Chronic grade 1 Diastolic CHF  -ECHO Oct 123456 with EF 60-65% with severe LVH and no RWMA -ECHO Sep 09, 2013 EF 45-50% with septal and apical hypokinesis  History of thyroid cancer, post thyroidectomy hypothyroidism  -TSH elevated at 10.174 - Synthroid increased - repeat labs in 4-6 weeks  Hypertension  -Continue when necessary IV metoprolol - Holding lisinopril due to acute renal failure  Hyperglycemia  -Hemoglobin A1c = 5.7 - follow trend   History of depression  -hold Zoloft secondary to acute encephalopathy.   Hyponatremia  -due to volume overload - ongoing HD should  correct   DVT prophylaxis: SCDs >> SQ heparin Code Status: Full Family Communication: No family at bedside Disposition:  SDU  Consultants: Cardiology PCCM Nephrology Neurology Infectious disease Hematology  Antibiotics: Nafcillin  2/10>> Rifampin 2/15 >> Vancomycin 2/10>>2/11 Gentamicin 2/10>> 2/13   Objective: Blood pressure 128/79, pulse 118, temperature 100.1 F (37.8 C), temperature source Axillary, resp. rate 20, height 6\' 1"  (1.854 m), weight 102.9 kg (226 lb 13.7 oz), SpO2 95.00%.  Intake/Output Summary (Last 24 hours) at 09/21/13 0846 Last data filed at 09/21/13 0600  Gross per 24 hour  Intake   1230 ml  Output   1500 ml  Net   -270 ml   Exam: General: No acute respiratory distress - alert but confused  Lungs: Clear to auscultation bilaterally without wheezes or crackles, RA Cardiovascular: RRR w/ 2/6 holosystolic M w/o gallup or rub  Abdomen: Nontender, nondistended, soft, bowel sounds positive, no rebound, no ascites, no appreciable mass Musculoskeletal: No significant cyanosis, clubbing of bilateral lower extremities - 1+ B LE edema  Neurological: Alert but not oriented  Scheduled Meds:  Scheduled Meds: . antiseptic oral rinse  15 mL Mouth Rinse q12n4p  . chlorhexidine  15 mL Mouth Rinse BID  . feeding supplement (RESOURCE BREEZE)  1 Container Oral TID WC  . insulin aspart  0-9 Units Subcutaneous TID WC  . levothyroxine  200 mcg Oral QAC breakfast  . nafcillin IV  2 g Intravenous Q4H  . pantoprazole  40 mg Oral Q1200  . rifampin (RIFADIN) IVPB  300 mg Intravenous 3 times per day  . sodium chloride  3 mL Intravenous Q12H   Data Reviewed: Basic Metabolic Panel:  Recent Labs Lab 09/15/13 0341 09/16/13 0335 09/17/13 0524 09/18/13 0330 09/19/13 0455 09/20/13 0555  NA 139 137 131* 135* 137 133*  K 3.5* 4.0 3.7 4.0 3.6* 3.2*  CL 101 98 92* 93* 96 93*  CO2 19 17* 15* 20 22 19   GLUCOSE 131* 127* 140* 132* 118* 110*  BUN 67* 81* 90* 72* 64* 71*  CREATININE 5.78* 7.60* 8.89* 7.87* 7.79* 9.00*  CALCIUM 7.5* 7.0* 7.5* 7.1* 7.1* 7.1*  MG 2.4 2.6* 2.8*  --  2.2  --   PHOS 4.5 6.0* 7.4*  --  6.7*  --     Liver Function Tests:  Recent Labs Lab 09/16/13 0335 09/17/13 0524 09/18/13 0330  09/19/13 0455 09/20/13 0555  AST 26 35 33 24 24  ALT 22 29 29 22 19   ALKPHOS 86 91 73 59 52  BILITOT 1.7* 1.7* 1.7* 1.7* 1.1  PROT 5.7* 6.8 6.1 5.6* 5.3*  ALBUMIN 1.5* 1.6* 1.4* 1.4* 1.3*   CBC:  Recent Labs Lab 09/16/13 0335 09/17/13 0524 09/17/13 1447 09/18/13 0330 09/19/13 0455 09/20/13 0555  WBC 21.8* 26.2* 24.7* 23.9* 18.4* 15.7*  NEUTROABS 19.8* 24.1* 23.0* 22.0* 16.9*  --   HGB 12.9* 14.8 12.3* 12.7* 10.3* 9.4*  HCT 35.0* 40.9 33.6* 34.5* 28.4* 26.5*  MCV 82.2 82.8 81.4 80.8 81.8 81.0  PLT 92* 165 145* 205 214 244   Cardiac Enzymes:  Recent Labs Lab 09/17/13 1231 09/17/13 1830  TROPONINI 0.38* 0.36*   CBG:  Recent Labs Lab 09/19/13 1626 09/19/13 2140 09/20/13 0819 09/20/13 1127 09/20/13 2114  GLUCAP 141* 132* 101* 94 73    Recent Results (from the past 240 hour(s))  CLOSTRIDIUM DIFFICILE BY PCR     Status: None   Collection Time    09/15/13  2:00 AM      Result Value Ref  Range Status   C difficile by pcr NEGATIVE  NEGATIVE Final   Comment: Performed at [facility]  MRSA PCR SCREENING     Status: None   Collection Time    09/15/13  5:31 PM      Result Value Ref Range Status   MRSA by PCR NEGATIVE  NEGATIVE Final   Comment:            The GeneXpert MRSA Assay (FDA     approved for NASAL specimens     only), is one component of a     comprehensive MRSA colonization     surveillance program. It is not     intended to diagnose MRSA     infection nor to guide or     monitor treatment for     MRSA infections.  CLOSTRIDIUM DIFFICILE BY PCR     Status: None   Collection Time    09/16/13 10:53 AM      Result Value Ref Range Status   C difficile by pcr NEGATIVE  NEGATIVE Final     Studies:  Recent x-ray studies have been reviewed in detail by the Attending Physician  Time spent : 35 mins  Cherene Altes, MD [facility] For Consults/Admissions - Flow Manager - [phone] Office  [phone] Pager  [phone]  On-Call/Text Page:      [email or web address]      password [facility]  09/21/2013, 8:46 AM   LOS: 13 days

## 2013-09-21 NOTE — Plan of Care (Signed)
Problem: Phase II Progression Outcomes Goal: Progress activity as tolerated unless otherwise ordered Outcome: Progressing Moves all extremities w/greater strength than 24 hrs ago . Somewhat more coordinated movements of all extremities.

## 2013-09-21 NOTE — Progress Notes (Signed)
Pt confused. Having frequent incont stools and placing hands in stool. Bath given. Hand hygiene performed. Rectal tube reapplied. Bil safety mitts applied to hands. Will continue to monitor.

## 2013-09-21 NOTE — Progress Notes (Signed)
Subjective: Pt remains confused; No SOB or chest pain.    Objective: Vital signs in last 24 hours: Filed Vitals:   09/21/13 0000 09/21/13 0336 09/21/13 0338 09/21/13 0741  BP: 120/78  127/72 128/79  Pulse:      Temp: 100.5 F (38.1 C)  98.6 F (37 C) 100.1 F (37.8 C)  TempSrc: Oral  Oral Axillary  Resp: 30   20  Height:      Weight:  226 lb 13.7 oz (102.9 kg)    SpO2: 98%  95% 95%   Weight change: 5 lb 1.1 oz (2.3 kg)  Intake/Output Summary (Last 24 hours) at 09/21/13 0905 Last data filed at 09/21/13 0600  Gross per 24 hour  Intake   1230 ml  Output   1500 ml  Net   -270 ml   Vitals reviewed. General: resting in bed, NAD HEENT: normal Cardiac: slightly tachycardic, 2/6 systolic murmur, no DM Pulm: clear to auscultation bilaterally anteriorly  Abd: soft, nontender, mildly distended, BS present, obese Ext: no pedal edema Neuro: decreased strength LUE and LLE Lab Results: Basic Metabolic Panel:  Recent Labs Lab 09/17/13 0524  09/19/13 0455 09/20/13 0555  NA 131*  < > 137 133*  K 3.7  < > 3.6* 3.2*  CL 92*  < > 96 93*  CO2 15*  < > 22 19  GLUCOSE 140*  < > 118* 110*  BUN 90*  < > 64* 71*  CREATININE 8.89*  < > 7.79* 9.00*  CALCIUM 7.5*  < > 7.1* 7.1*  MG 2.8*  --  2.2  --   PHOS 7.4*  --  6.7*  --   < > = values in this interval not displayed. Liver Function Tests:  Recent Labs Lab 09/19/13 0455 09/20/13 0555  AST 24 24  ALT 22 19  ALKPHOS 59 52  BILITOT 1.7* 1.1  PROT 5.6* 5.3*  ALBUMIN 1.4* 1.3*   CBC:  Recent Labs Lab 09/18/13 0330 09/19/13 0455 09/20/13 0555  WBC 23.9* 18.4* 15.7*  NEUTROABS 22.0* 16.9*  --   HGB 12.7* 10.3* 9.4*  HCT 34.5* 28.4* 26.5*  MCV 80.8 81.8 81.0  PLT 205 214 244   Cardiac Enzymes:  Recent Labs Lab 09/17/13 1231 09/17/13 1830  TROPONINI 0.38* 0.36*   CBG:  Recent Labs Lab 09/19/13 1626 09/19/13 2140 09/20/13 0819 09/20/13 1127 09/20/13 2114 09/21/13 0843  GLUCAP 141* 132* 101* 94 73  142*   Thyroid Function Tests:  Recent Labs Lab 09/14/13 1252  TSH 10.174*  FREET4 0.84   Coagulation:  Recent Labs Lab 09/18/13 0330 09/19/13 0455 09/20/13 0555 09/21/13 0345  LABPROT 16.2* 17.5* 16.7* 17.8*  INR 1.33 1.48 1.39 1.51*   Urine Drug Screen: Drugs of Abuse     Component Value Date/Time   LABOPIA NONE DETECTED 09/06/2013 2237   COCAINSCRNUR NONE DETECTED 09/06/2013 2237   LABBENZ NONE DETECTED 09/06/2013 2237   AMPHETMU NONE DETECTED 09/06/2013 2237   THCU POSITIVE* 09/06/2013 2237   LABBARB NONE DETECTED 09/06/2013 2237    Misc. Labs: none  Micro Results: Recent Results (from the past 240 hour(s))  CLOSTRIDIUM DIFFICILE BY PCR     Status: None   Collection Time    09/15/13  2:00 AM      Result Value Ref Range Status   C difficile by pcr NEGATIVE  NEGATIVE Final   Comment: Performed at [facility]  MRSA PCR SCREENING     Status: None   Collection Time  09/15/13  5:31 PM      Result Value Ref Range Status   MRSA by PCR NEGATIVE  NEGATIVE Final   Comment:            The GeneXpert MRSA Assay (FDA     approved for NASAL specimens     only), is one component of a     comprehensive MRSA colonization     surveillance program. It is not     intended to diagnose MRSA     infection nor to guide or     monitor treatment for     MRSA infections.  CLOSTRIDIUM DIFFICILE BY PCR     Status: None   Collection Time    09/16/13 10:53 AM      Result Value Ref Range Status   C difficile by pcr NEGATIVE  NEGATIVE Final   Studies/Results: No results found. Medications:  Medications Prior to Admission  Medication Sig Dispense Refill  . acetaminophen (TYLENOL) 500 MG tablet Take 500 mg by mouth every 6 (six) hours as needed for mild pain or fever.      Marland Kitchen aspirin 325 MG tablet Take 325 mg by mouth daily.        Marland Kitchen ibuprofen (ADVIL,MOTRIN) 200 MG tablet Take 200 mg by mouth every 6 (six) hours as needed for fever.      . levothyroxine (SYNTHROID, LEVOTHROID) 175  MCG tablet Take 175 mcg by mouth daily.      Marland Kitchen lisinopril (PRINIVIL,ZESTRIL) 10 MG tablet Take 1 tablet (10 mg total) by mouth daily.  30 tablet  11  . metoprolol (LOPRESSOR) 50 MG tablet Take 25 mg by mouth 2 (two) times daily.      . multivitamin (THERAGRAN) per tablet Take 1 tablet by mouth daily.        Marland Kitchen oseltamivir (TAMIFLU) 75 MG capsule Take 1 capsule (75 mg total) by mouth every 12 (twelve) hours.  10 capsule  0  . sertraline (ZOLOFT) 50 MG tablet Take 50 mg by mouth daily.       Scheduled Meds: . antiseptic oral rinse  15 mL Mouth Rinse q12n4p  . chlorhexidine  15 mL Mouth Rinse BID  . feeding supplement (RESOURCE BREEZE)  1 Container Oral TID WC  . insulin aspart  0-9 Units Subcutaneous TID WC  . levothyroxine  200 mcg Oral QAC breakfast  . nafcillin IV  2 g Intravenous Q4H  . pantoprazole  40 mg Oral Q1200  . rifampin (RIFADIN) IVPB  300 mg Intravenous 3 times per day  . sodium chloride  3 mL Intravenous Q12H   Continuous Infusions:   PRN Meds:.sodium chloride, sodium chloride, acetaminophen, acetaminophen, feeding supplement (NEPRO CARB STEADY), heparin, lidocaine (PF), lidocaine-prilocaine, liver oil-zinc oxide, metoprolol, morphine injection, ondansetron (ZOFRAN) IV, pentafluoroprop-tetrafluoroeth, zolpidem 09/09/13 echo Left ventricle: septal and apical hypokinesis The cavity size was mildly dilated. Wall thickness was increased in a pattern of moderate LVH. Systolic function was mildly reduced. The estimated ejection fraction was in the range of 45% to 50%.  ------------------------------------------------------------ Aortic valve: Apparent tissue AVR. No abscess or vegetation seen. No AR or perivalvular leak and normal systolic gradients Suggest TEE if clinical suspicion for SBE high Doppler: Peak velocity ratio of LVOT to aortic valve: 0.32. Mean velocity ratio of LVOT to aortic valve: 0.34. Mean gradient: 56mm Hg (S). Peak gradient: 73mm Hg  (S).  ------------------------------------------------------------ Mitral valve: Mildly thickened leaflets . Doppler: Mild regurgitation.  ------------------------------------------------------------ Left atrium: The atrium was mildly dilated.  ------------------------------------------------------------  Atrial septum: Poorly visualized.  ------------------------------------------------------------ Right ventricle: The cavity size was normal. Wall thickness was normal. Systolic function was normal.  ------------------------------------------------------------ Pulmonic valve: Poorly visualized. The valve appears to be grossly normal.  ------------------------------------------------------------ Tricuspid valve: Poorly visualized. Doppler: Mild regurgitation.  ------------------------------------------------------------ Right atrium: The atrium was normal in size.  ------------------------------------------------------------ Pericardium: The pericardium was normal in appearance.   Assessment/Plan: 58 y.o male with complicated medication history. 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 This admission since 2/9 has been complicated with multiple medical issues (below). He was transferred to [facility] from [facility] on 2/16 in case he needed HD for worsening renal function. He was transferred from SDU to cardiac ICU 2/18 due to EKG changes, diaphoresis and sob (now resolved).   #MSSA bacteremia with concern for infective endocarditis s/p AVR  -+cultures 09/08/13 -ID rec tx x 6 weeks  -Now pt only on Nafcillin, Rifampin  -Plan TEE when mental status and confusion improves, hopefully next week. Given multiple medical issues (recent CVA, renal  failure, confusion), not clear he is a surgical candidate at present Repeat ECG in AM for PR interval. #NSTEMI (non-ST elevated myocardial infarction) type 2  -likely 2/2 demand ischemia   Ref. Range  09/10/2013 07:53  09/10/2013 15:00  09/10/2013 23:15  09/12/2013 09:13  09/12/2013 14:19  09/12/2013 17:30  09/12/2013 20:10   Troponin I  Latest Range: <0.30 ng/mL  2.26 (HH)  2.05 (HH)  1.78 (HH)  1.52 (HH)  1.54 (HH)   1.39 (HH)     Ref. Range  09/17/2013 12:31  09/17/2013 18:30   Troponin I  Latest Range: <0.30 ng/mL  0.38 (HH)  0.36 (HH)      #Septic emboli/hemmorhage with encephalitis  -noted on CT head  -Neuro, ID following  -at some point this admission associated with left hemiparesis.   #Acute vs chronic diastolic heart failure  -concerned for this admission but appear improved. -echo 2/10 with EF 45-50% h/o grade 1 diastolic dysfunction noted 04/2013   Ref. Range  09/10/2013 07:53  09/10/2013 15:00  09/10/2013 23:15  09/12/2013 09:13  09/12/2013 14:19  09/12/2013 17:30  09/12/2013 20:10  09/14/2013 04:45   Pro B Natriuretic peptide (BNP)  Latest Range: 0.0-100.0 pg/mL  37349.0 (H)        >70000.0 (H)     #Acute renal failure  -Could be ATN 2/2 bacteremia/immune complex thought less likely d/t medications. Gentamycin stopped.  -Nephro consulted and following  -renal US wnl, renal artery duplex pending   #Sepsis  -on Abx   #MSSA UTI  -on Abx   #Acute Encephalopathy   #HYPOTHYROIDISM s/p thyroid cancer and thyroidectomy  -abnormal thyroid studies   Ref. Range  09/14/2013 12:52   TSH  Latest Range: 0.350-4.500 uIU/mL  10.174 (H)   Free T4  Latest Range: 0.80-1.80 ng/dL  0.84   T3, Total  Latest Range: 80.0-204.0 ng/dl  29.1 (L)   -will need repeat thyroid studies in the future   Full code  PCP [name], MD)    Ellis Parents 09/21/2013 9:05 AM

## 2013-09-21 NOTE — Progress Notes (Signed)
Dr sent text re Pt's HR sustained  114 - 120 's . "ST" ---See 12-LEAD EKG . Also , Pt very restless and agitated .Pushing staff with hands(bilat mittens on for safety),dangling at bedside self and trying to get OOB . Bed alarm on for safety  . IV tubing often being tugged ( taped carefully onto skin) while Pt "rolls" back and forth in bed.Pt tugging at flexiseal and trying to remove mittens with his teeth .Spouse currently at bedside .Reassurance provided .

## 2013-09-21 NOTE — Progress Notes (Addendum)
PT Cancellation Note  Patient Details Name: Bradley [name] MRN: [id number] DOB: 10/15/1955   Cancelled Treatment:    Reason Eval/Treat Not Completed: Patient not medically ready;Other (comment) (Pt. with sustained elevated HR, restlessness, pulling at tubes.  PT eval deferred until pt. Better able to participate.[name] 09/21/2013, 12:46 PM [name] PT [facility] [phone] [place] [phone]

## 2013-09-22 LAB — RENAL FUNCTION PANEL
Albumin: 1.2 g/dL — ABNORMAL LOW (ref 3.5–5.2)
BUN: 55 mg/dL — ABNORMAL HIGH (ref 6–23)
CO2: 19 meq/L (ref 19–32)
Calcium: 7.5 mg/dL — ABNORMAL LOW (ref 8.4–10.5)
Chloride: 93 meq/L — ABNORMAL LOW (ref 96–112)
Creatinine, Ser: 9.36 mg/dL — ABNORMAL HIGH (ref 0.50–1.35)
GFR calc Af Amer: 6 mL/min — ABNORMAL LOW
GFR calc non Af Amer: 6 mL/min — ABNORMAL LOW
Glucose, Bld: 132 mg/dL — ABNORMAL HIGH (ref 70–99)
Phosphorus: 8.2 mg/dL — ABNORMAL HIGH (ref 2.3–4.6)
Potassium: 3 meq/L — ABNORMAL LOW (ref 3.7–5.3)
Sodium: 134 meq/L — ABNORMAL LOW (ref 137–147)

## 2013-09-22 LAB — AMMONIA: Ammonia: 17 umol/L (ref 11–60)

## 2013-09-22 LAB — GLUCOSE, CAPILLARY
Glucose-Capillary: 110 mg/dL — ABNORMAL HIGH (ref 70–99)
Glucose-Capillary: 140 mg/dL — ABNORMAL HIGH (ref 70–99)
Glucose-Capillary: 146 mg/dL — ABNORMAL HIGH (ref 70–99)

## 2013-09-22 LAB — RPR: RPR Ser Ql: NONREACTIVE

## 2013-09-22 LAB — VITAMIN B12: Vitamin B-12: 999 pg/mL — ABNORMAL HIGH (ref 211–911)

## 2013-09-22 LAB — PROTIME-INR
INR: 1.38 (ref 0.00–1.49)
Prothrombin Time: 16.6 seconds — ABNORMAL HIGH (ref 11.6–15.2)

## 2013-09-22 LAB — FOLATE: Folate: 14.5 ng/mL

## 2013-09-22 MED ORDER — SODIUM CHLORIDE 0.9 % IV SOLN: 100.0000 mL | INTRAVENOUS | Status: DC | PRN

## 2013-09-22 MED ORDER — NEPRO/CARBSTEADY PO LIQD
237.0000 mL | ORAL | Status: DC | PRN
  Filled 2013-09-22: qty 237

## 2013-09-22 MED ORDER — PENTAFLUOROPROP-TETRAFLUOROETH EX AERO: 1.0000 "application " | INHALATION_SPRAY | CUTANEOUS | Status: DC | PRN

## 2013-09-22 MED ORDER — POTASSIUM CHLORIDE CRYS ER 20 MEQ PO TBCR
20.0000 meq | EXTENDED_RELEASE_TABLET | Freq: Every day | ORAL | Status: DC
  Administered 2013-09-22 – 2013-10-02 (×10): 20 meq via ORAL
  Filled 2013-09-22 (×15): qty 1

## 2013-09-22 MED ORDER — LIDOCAINE HCL (PF) 1 % IJ SOLN: 5.0000 mL | INTRAMUSCULAR | Status: DC | PRN

## 2013-09-22 MED ORDER — HEPARIN SODIUM (PORCINE) 1000 UNIT/ML DIALYSIS
1000.0000 [IU] | INTRAMUSCULAR | Status: DC | PRN
  Administered 2013-09-22: 2800 [IU] via INTRAVENOUS_CENTRAL

## 2013-09-22 MED ORDER — LIDOCAINE-PRILOCAINE 2.5-2.5 % EX CREA
1.0000 "application " | TOPICAL_CREAM | CUTANEOUS | Status: DC | PRN
  Filled 2013-09-22: qty 5

## 2013-09-22 MED ORDER — ALTEPLASE 2 MG IJ SOLR
2.0000 mg | Freq: Once | INTRAMUSCULAR | Status: DC | PRN
Start: 2013-09-22 — End: 2013-09-22
  Filled 2013-09-22: qty 2

## 2013-09-22 NOTE — Progress Notes (Signed)
Subjective: Pt remains confused; AAO x 1, based on his daughter and the nephrologist his confusion has improved in the last week.     Objective: Vital signs in last 24 hours: Filed Vitals:   09/22/13 0400 09/22/13 0600 09/22/13 0733 09/22/13 1223  BP: 159/102  152/100   Pulse: 105  105 106  Temp: 98.4 F (36.9 C)  99.5 F (37.5 C) 98.2 F (36.8 C)  TempSrc: Oral  Oral Oral  Resp: 23 22 24    Height:      Weight: 226 lb 3.1 oz (102.6 kg)     SpO2: 96%  95% 93%   Weight change: -9 lb 7.7 oz (-4.3 kg)  Intake/Output Summary (Last 24 hours) at 09/22/13 1259 Last data filed at 09/22/13 0914  Gross per 24 hour  Intake   2140 ml  Output    200 ml  Net   1940 ml   Vitals reviewed. General: resting in bed, NAD HEENT: normal Cardiac: slightly tachycardic, 2/6 systolic murmur, no DM Pulm: clear to auscultation bilaterally anteriorly  Abd: soft, nontender, mildly distended, BS present, obese Ext: no pedal edema Neuro: decreased strength LUE and LLE Lab Results: Basic Metabolic Panel:  Recent Labs Lab 09/17/13 0524  09/19/13 0455 09/20/13 0555  NA 131*  < > 137 133*  K 3.7  < > 3.6* 3.2*  CL 92*  < > 96 93*  CO2 15*  < > 22 19  GLUCOSE 140*  < > 118* 110*  BUN 90*  < > 64* 71*  CREATININE 8.89*  < > 7.79* 9.00*  CALCIUM 7.5*  < > 7.1* 7.1*  MG 2.8*  --  2.2  --   PHOS 7.4*  --  6.7*  --   < > = values in this interval not displayed. Liver Function Tests:  Recent Labs Lab 09/19/13 0455 09/20/13 0555  AST 24 24  ALT 22 19  ALKPHOS 59 52  BILITOT 1.7* 1.1  PROT 5.6* 5.3*  ALBUMIN 1.4* 1.3*   CBC:  Recent Labs Lab 09/18/13 0330 09/19/13 0455 09/20/13 0555  WBC 23.9* 18.4* 15.7*  NEUTROABS 22.0* 16.9*  --   HGB 12.7* 10.3* 9.4*  HCT 34.5* 28.4* 26.5*  MCV 80.8 81.8 81.0  PLT 205 214 244   Cardiac Enzymes:  Recent Labs Lab 09/17/13 1231 09/17/13 1830  TROPONINI 0.38* 0.36*   CBG:  Recent Labs Lab 09/21/13 0843 09/21/13 1234  09/21/13 1646 09/21/13 2123 09/22/13 0810 09/22/13 1228  GLUCAP 142* 124* 156* 156* 110* 140*   Thyroid Function Tests: No results found for this basename: TSH, T4TOTAL, FREET4, T3FREE, THYROIDAB,  in the last 168 hours Coagulation:  Recent Labs Lab 09/19/13 0455 09/20/13 0555 09/21/13 0345 09/22/13 0300  LABPROT 17.5* 16.7* 17.8* 16.6*  INR 1.48 1.39 1.51* 1.38   Urine Drug Screen: Drugs of Abuse     Component Value Date/Time   LABOPIA NONE DETECTED 09/06/2013 2237   COCAINSCRNUR NONE DETECTED 09/06/2013 2237   LABBENZ NONE DETECTED 09/06/2013 2237   AMPHETMU NONE DETECTED 09/06/2013 2237   THCU POSITIVE* 09/06/2013 2237   LABBARB NONE DETECTED 09/06/2013 2237    Misc. Labs: none  Micro Results: Recent Results (from the past 240 hour(s))  CLOSTRIDIUM DIFFICILE BY PCR     Status: None   Collection Time    09/15/13  2:00 AM      Result Value Ref Range Status   C difficile by pcr NEGATIVE  NEGATIVE Final   Comment:  Performed at [facility]  MRSA PCR SCREENING     Status: None   Collection Time    09/15/13  5:31 PM      Result Value Ref Range Status   MRSA by PCR NEGATIVE  NEGATIVE Final   Comment:            The GeneXpert MRSA Assay (FDA     approved for NASAL specimens     only), is one component of a     comprehensive MRSA colonization     surveillance program. It is not     intended to diagnose MRSA     infection nor to guide or     monitor treatment for     MRSA infections.  CLOSTRIDIUM DIFFICILE BY PCR     Status: None   Collection Time    09/16/13 10:53 AM      Result Value Ref Range Status   C difficile by pcr NEGATIVE  NEGATIVE Final   Studies/Results: Ir Fluoro Guide Cv Line Right  09/21/2013   CLINICAL DATA:  Renal failure  EXAM: IR RIGHT FLOURO GUIDE CV LINE; IR ULTRASOUND GUIDANCE VASC ACCESS RIGHT  MEDICATIONS AND MEDICAL HISTORY: None  ANESTHESIA/SEDATION: None  CONTRAST:  None  FLUOROSCOPY TIME:  18 seconds.  PROCEDURE: The procedure, risks,  benefits, and alternatives were explained to the patient. Questions regarding the procedure were encouraged and answered. The patient understands and consents to the procedure.  The right neck was prepped with Betadine in a sterile fashion, and a sterile drape was applied covering the operative field. A sterile gown and sterile gloves were used for the procedure.  Under sonographic guidance, a micropuncture needle was inserted into the right internal jugular vein and removed over a 018 wire which was up sized to a 3 J. the 20 cm temporary dialysis catheter was advanced over the wire to the right atrium. It was flushed and sewn in place.  COMPLICATIONS: None  FINDINGS: Tip of the temporary dialysis catheter is at the right atrium  IMPRESSION: Successful placement of a temporary right internal jugular vein dialysis catheter.   Electronically Signed   By: Maryclare Bean M.D.   On: 09/21/2013 12:15   Ir US Guide Vasc Access Right  09/21/2013   CLINICAL DATA:  Renal failure  EXAM: IR RIGHT FLOURO GUIDE CV LINE; IR ULTRASOUND GUIDANCE VASC ACCESS RIGHT  MEDICATIONS AND MEDICAL HISTORY: None  ANESTHESIA/SEDATION: None  CONTRAST:  None  FLUOROSCOPY TIME:  18 seconds.  PROCEDURE: The procedure, risks, benefits, and alternatives were explained to the patient. Questions regarding the procedure were encouraged and answered. The patient understands and consents to the procedure.  The right neck was prepped with Betadine in a sterile fashion, and a sterile drape was applied covering the operative field. A sterile gown and sterile gloves were used for the procedure.  Under sonographic guidance, a micropuncture needle was inserted into the right internal jugular vein and removed over a 018 wire which was up sized to a 3 J. the 20 cm temporary dialysis catheter was advanced over the wire to the right atrium. It was flushed and sewn in place.  COMPLICATIONS: None  FINDINGS: Tip of the temporary dialysis catheter is at the right atrium   IMPRESSION: Successful placement of a temporary right internal jugular vein dialysis catheter.   Electronically Signed   By: Maryclare Bean M.D.   On: 09/21/2013 12:15   Medications:  Medications Prior to Admission  Medication Sig Dispense  Refill  . acetaminophen (TYLENOL) 500 MG tablet Take 500 mg by mouth every 6 (six) hours as needed for mild pain or fever.      Marland Kitchen aspirin 325 MG tablet Take 325 mg by mouth daily.        Marland Kitchen ibuprofen (ADVIL,MOTRIN) 200 MG tablet Take 200 mg by mouth every 6 (six) hours as needed for fever.      . levothyroxine (SYNTHROID, LEVOTHROID) 175 MCG tablet Take 175 mcg by mouth daily.      Marland Kitchen lisinopril (PRINIVIL,ZESTRIL) 10 MG tablet Take 1 tablet (10 mg total) by mouth daily.  30 tablet  11  . metoprolol (LOPRESSOR) 50 MG tablet Take 25 mg by mouth 2 (two) times daily.      . multivitamin (THERAGRAN) per tablet Take 1 tablet by mouth daily.        Marland Kitchen oseltamivir (TAMIFLU) 75 MG capsule Take 1 capsule (75 mg total) by mouth every 12 (twelve) hours.  10 capsule  0  . sertraline (ZOLOFT) 50 MG tablet Take 50 mg by mouth daily.       Scheduled Meds: . antiseptic oral rinse  15 mL Mouth Rinse q12n4p  . chlorhexidine  15 mL Mouth Rinse BID  . feeding supplement (RESOURCE BREEZE)  1 Container Oral TID WC  . heparin subcutaneous  5,000 Units Subcutaneous 3 times per day  . insulin aspart  0-9 Units Subcutaneous TID WC  . levothyroxine  200 mcg Oral QAC breakfast  . nafcillin IV  2 g Intravenous Q4H  . pantoprazole  40 mg Oral Q1200  . rifampin (RIFADIN) IVPB  300 mg Intravenous 3 times per day  . sevelamer carbonate  800 mg Oral TID WC  . sodium chloride  3 mL Intravenous Q12H   Continuous Infusions:   PRN Meds:.sodium chloride, sodium chloride, acetaminophen, acetaminophen, feeding supplement (NEPRO CARB STEADY), heparin, hydrOXYzine, lidocaine (PF), lidocaine-prilocaine, liver oil-zinc oxide, metoprolol, morphine injection, ondansetron (ZOFRAN) IV,  pentafluoroprop-tetrafluoroeth, zolpidem 09/09/13 echo Left ventricle: septal and apical hypokinesis The cavity size was mildly dilated. Wall thickness was increased in a pattern of moderate LVH. Systolic function was mildly reduced. The estimated ejection fraction was in the range of 45% to 50%.  ------------------------------------------------------------ Aortic valve: Apparent tissue AVR. No abscess or vegetation seen. No AR or perivalvular leak and normal systolic gradients Suggest TEE if clinical suspicion for SBE high Doppler: Peak velocity ratio of LVOT to aortic valve: 0.32. Mean velocity ratio of LVOT to aortic valve: 0.34. Mean gradient: 10mm Hg (S). Peak gradient: 108mm Hg (S).  ------------------------------------------------------------ Mitral valve: Mildly thickened leaflets . Doppler: Mild regurgitation.  ------------------------------------------------------------ Left atrium: The atrium was mildly dilated.  ------------------------------------------------------------ Atrial septum: Poorly visualized.  ------------------------------------------------------------ Right ventricle: The cavity size was normal. Wall thickness was normal. Systolic function was normal.  ------------------------------------------------------------ Pulmonic valve: Poorly visualized. The valve appears to be grossly normal.  ------------------------------------------------------------ Tricuspid valve: Poorly visualized. Doppler: Mild regurgitation.  ------------------------------------------------------------ Right atrium: The atrium was normal in size.  ------------------------------------------------------------ Pericardium: The pericardium was normal in appearance.   Assessment/Plan: 58 y.o male with complicated medication history. PMH pseudoanerysm>aortic stenosis s/p biprosthetic valve replacement Bradley [name]) 2011 and pediatric aortic valvulotomy age 28, heart cath 02/2010 normal,  h/o LBBB, severe LVH (noted on EKG), h/o grade 1 diastolic dysfunction, hypothyroidism s/p thyroidectomy for thyroid cancer, HTN, anxiety/depression. This admission since 2/9 has been complicated with multiple medical issues (below). He was transferred to [facility] from [facility] on 2/16 in case he needed HD for worsening renal  function. He was transferred from SDU to cardiac ICU 2/18 due to EKG changes, diaphoresis and sob (now resolved).   #MSSA bacteremia with concern for infective endocarditis s/p AVR  -+cultures 09/08/13 -ID rec tx x 6 weeks  -Now pt only on Nafcillin, Rifampin  -Plan TEE when mental status and confusion improves, hopefully this week. Given multiple medical issues (recent CVA, renal failure, confusion), not clear he is a surgical candidate at present Repeat ECG in AM for PR interval.  #NSTEMI (non-ST elevated myocardial infarction) type 2  -likely 2/2 demand ischemia   Ref. Range  09/10/2013 07:53  09/10/2013 15:00  09/10/2013 23:15  09/12/2013 09:13  09/12/2013 14:19  09/12/2013 17:30  09/12/2013 20:10   Troponin I  Latest Range: <0.30 ng/mL  2.26 (HH)  2.05 (HH)  1.78 (HH)  1.52 (HH)  1.54 (HH)   1.39 (HH)     Ref. Range  09/17/2013 12:31  09/17/2013 18:30   Troponin I  Latest Range: <0.30 ng/mL  0.38 (HH)  0.36 (HH)    #Septic emboli/hemmorhage with encephalitis  -noted on CT head  -Neuro, ID following  -at some point this admission associated with left hemiparesis.   #Acute vs chronic diastolic heart failure  -concerned for this admission but appear improved. -echo 2/10 with EF 45-50% h/o grade 1 diastolic dysfunction noted 04/2013   Ref. Range  09/10/2013 07:53  09/10/2013 15:00  09/10/2013 23:15  09/12/2013 09:13  09/12/2013 14:19  09/12/2013 17:30  09/12/2013 20:10  09/14/2013 04:45   Pro B Natriuretic peptide (BNP)  Latest Range: 0.0-100.0 pg/mL  37349.0 (H)        >70000.0 (H)     #Acute renal failure  -Could be ATN 2/2 bacteremia/immune complex thought less likely d/t medications.  Gentamycin stopped.  -Nephro consulted and following  -renal US wnl, renal artery duplex pending   #Sepsis  -on Abx   #MSSA UTI  -on Abx   #Acute Encephalopathy   #HYPOTHYROIDISM s/p thyroid cancer and thyroidectomy  -abnormal thyroid studies   Ref. Range  09/14/2013 12:52   TSH  Latest Range: 0.350-4.500 uIU/mL  10.174 (H)   Free T4  Latest Range: 0.80-1.80 ng/dL  0.84   T3, Total  Latest Range: 80.0-204.0 ng/dl  29.1 (L)   -will need repeat thyroid studies in the future   Full code  PCP [name], MD)    Ena Dawley, HMD 09/22/2013 12:59 PM

## 2013-09-22 NOTE — Evaluation (Signed)
Occupational Therapy Evaluation Patient Details Name: Bradley [name] MRN: [id number] DOB: June 25, 1956 Today's Date: 09/22/2013 Time: [id number] OT Time Calculation (min): 23 min  OT Assessment / Plan / Recommendation History of present illness Pt admit with sepsis and r/o endocarditis.     Clinical Impression   Pt presents with confusion, restlessness alternating with lethargy, and inability to follow commands. Pt is dependent in all ADL and requires +2 assist for mobility.  Will follow acutely.  OT Assessment  Patient needs continued OT Services    Follow Up Recommendations  CIR;Supervision/Assistance - 24 hour    Barriers to Discharge      Equipment Recommendations       Recommendations for Other Services Rehab consult  Frequency  Min 2X/week    Precautions / Restrictions Precautions Precautions: Fall Restrictions Weight Bearing Restrictions: No   Pertinent Vitals/Pain HR 100 at rest, no apparent pain, 02 sats in 90s on RA    ADL  Eating/Feeding: +1 Total assistance Where Assessed - Eating/Feeding: Bed level Grooming: Wash/dry hands;Wash/dry face;+1 Total assistance Where Assessed - Grooming: Supine, head of bed up ADL Comments: Total assist for bathing and dressing in supported sitting or a bedlevel.    OT Diagnosis: Generalized weakness;Cognitive deficits  OT Problem List: Decreased strength;Decreased activity tolerance;Impaired balance (sitting and/or standing);Decreased coordination;Decreased cognition;Decreased safety awareness;Decreased knowledge of use of DME or AE;Cardiopulmonary status limiting activity;Impaired UE functional use OT Treatment Interventions: Self-care/ADL training;Therapeutic activities;Cognitive remediation/compensation;Patient/family education;Balance training;DME and/or AE instruction   OT Goals(Current goals can be found in the care plan section) Acute Rehab OT Goals Patient Stated Goal: unable to state as pt confused OT Goal Formulation:  With family Time For Goal Achievement: 10/06/13 Potential to Achieve Goals: Good ADL Goals Pt Will Perform Eating: with min assist;sitting Pt Will Perform Grooming: with min assist;sitting Pt Will Perform Upper Body Bathing: with min assist;sitting Pt Will Perform Upper Body Dressing: with min assist;sitting Pt Will Transfer to Toilet: with min assist;ambulating;bedside commode Additional ADL Goal #1: Pt will maintain attention to task x 2 minutes with min verbal cues. Additional ADL Goal #2: Pt will follow one step directions 80% of time. Additional ADL Goal #3: Pt will sit EOB x 10 minutes with supervision while engaged in ADL.  Visit Information  Last OT Received On: 09/22/13 Assistance Needed: +2 History of Present Illness: Pt admit with sepsis and r/o endocarditis.         Prior [place] expects to be discharged to:: Private residence Living Arrangements: Spouse/significant other Available Help at Discharge: Family;Available 24 hours/day Type of Home: House Home Access: Stairs to enter [employer] of Steps: 5 Entrance Stairs-Rails: Can reach both Home Layout: One level Home Equipment: Shower seat - built in Additional Comments: daughter Judson Roch at bedside Prior Function Level of Independence: Independent Communication Communication:  (answered with one to two words) Dominant Hand: Right         Vision/Perception Vision - History Baseline Vision: Wears glasses only for reading Vision - Assessment Vision Assessment: Vision not tested   Cognition  Cognition Arousal/Alertness: Lethargic Behavior During Therapy: Restless Overall Cognitive Status: Impaired/Different from baseline Area of Impairment: Orientation;Attention;Memory;Following commands;Safety/judgement;Awareness;Problem solving;Rancho level Orientation Level: Disoriented to;Place;Time;Situation Current Attention Level: Focused Memory: Decreased short-term  memory Following Commands: Follows one step commands inconsistently;Follows one step commands with increased time Safety/Judgement: Decreased awareness of safety;Decreased awareness of deficits Awareness: Intellectual Problem Solving: Slow processing;Decreased initiation;Difficulty sequencing;Requires verbal cues;Requires tactile cues General Comments: Pt was confused overall.  Not following commands at all.  Pt easily distracted.  Very internally distracted.  Poor motor processing.      Extremity/Trunk Assessment Upper Extremity Assessment Upper Extremity Assessment: Difficult to assess due to impaired cognition (full AROM,  good grip strength) Lower Extremity Assessment Lower Extremity Assessment: Defer to PT evaluation     Mobility Bed Mobility Overal bed mobility: Needs Assistance;+2 for physical assistance Bed Mobility: Supine <>Sit Supine <>sit: Max assist;+2 for physical assistance Transfers Overall transfer level: Needs assistance Sit to Stand: Max assist;+2 physical assistance;From elevated surface Stand pivot transfers: Max assist;+2 physical assistance;From elevated surface     Exercise     Balance Balance Sitting balance-Leahy Scale: Poor   End of Session OT - End of Session Activity Tolerance: Patient limited by lethargy Patient left: in bed;with call bell/phone within reach;with bed alarm set;with family/visitor present  GO     Malka So 09/22/2013, 2:29 PM [phone]

## 2013-09-22 NOTE — Progress Notes (Signed)
Rehab Admissions Coordinator Note:  Patient was screened by Retta Diones for appropriateness for an Inpatient Acute Rehab Consult.  At this time, we are recommending Inpatient Rehab consult.  Jodell Cipro M 09/22/2013, 11:41 AM  I can be reached at [phone].

## 2013-09-22 NOTE — Evaluation (Signed)
Physical Therapy Evaluation Patient Details Name: Bradley [name] MRN: [id number] DOB: Dec 13, 1955 Today's Date: 09/22/2013 Time: [id number] PT Time Calculation (min): 21 min  PT Assessment / Plan / Recommendation History of Present Illness  Pt admit with sepsis and r/o endocarditis.    Clinical Impression  Pt admitted with above. Pt currently with functional limitations due to the deficits listed below (see PT Problem List). Pt confused and poor motor processing limiting treatment progression.  Called wife to determine d/c plan and feel that Rehab is a good plan for pt.  Nursing aware of pt in chair with chair alarm in place.  Pt will benefit from skilled PT to increase their independence and safety with mobility to allow discharge to the venue listed below.     PT Assessment  Patient needs continued PT services    Follow Up Recommendations  CIR;Supervision/Assistance - 24 hour    Does the patient have the potential to tolerate intense rehabilitation      Barriers to Discharge        Equipment Recommendations  Other (comment) (TBA)    Recommendations for Other Services Rehab consult   Frequency Min 3X/week    Precautions / Restrictions Precautions Precautions: Fall Restrictions Weight Bearing Restrictions: No   Pertinent Vitals/Pain VSS, No pain      Mobility  Bed Mobility Overal bed mobility: Needs Assistance;+2 for physical assistance Bed Mobility: Supine to Sit Supine to sit: Max assist;+2 for physical assistance General bed mobility comments: Pt could not follow commands to move LEs off bed or to elevated trunk.   Transfers Overall transfer level: Needs assistance Transfers: Sit to/from Stand;Stand Pivot Transfers Sit to Stand: Max assist;+2 physical assistance;From elevated surface Stand pivot transfers: Max assist;+2 physical assistance;From elevated surface General transfer comment: Pt needed incr assist to stand and pivot as pt initially was not weight bearing  on LEs due to poor motor processing.  After several failed attempts to stand, pt did finally weight bear enoughon LEs with PT and tech using belt and pad to pivot to recliner from bed.      Exercises     PT Diagnosis: Generalized weakness  PT Problem List: Decreased activity tolerance;Decreased strength;Decreased balance;Decreased mobility;Decreased coordination;Decreased knowledge of use of DME;Decreased safety awareness;Decreased knowledge of precautions PT Treatment Interventions: DME instruction;Gait training;Functional mobility training;Therapeutic activities;Therapeutic exercise;Balance training;Neuromuscular re-education;Patient/family education;Cognitive remediation     PT Goals(Current goals can be found in the care plan section) Acute Rehab PT Goals Patient Stated Goal: unable to state as pt confused PT Goal Formulation: With patient Time For Goal Achievement: 10/06/13 Potential to Achieve Goals: Good  Visit Information  Last PT Received On: 09/22/13 Assistance Needed: +2 History of Present Illness: Pt admit with sepsis and r/o endocarditis.         Prior [place] expects to be discharged to:: Private residence Living Arrangements: Spouse/significant other Available Help at Discharge: Family;Available 24 hours/day Type of Home: House Home Access: Stairs to enter [employer] of Steps: 5 Entrance Stairs-Rails: Can reach both Home Layout: One level Home Equipment: None Additional Comments: Wife works at [employer].  May be able to arrange 24 hour cares. Called wife and spoke via phone re: home information and she gave all this information.   Prior Function Level of Independence: Independent Dominant Hand: Right    Cognition  Cognition Arousal/Alertness: Lethargic Behavior During Therapy: Agitated Overall Cognitive Status: Impaired/Different from baseline Area of Impairment: Orientation;Attention;Memory;Following  commands;Safety/judgement;Awareness;Problem solving;Rancho level Orientation Level: Disoriented to;Place;Time;Situation  Current Attention Level: Focused Memory: Decreased short-term memory Following Commands: Follows one step commands inconsistently;Follows one step commands with increased time Safety/Judgement: Decreased awareness of safety;Decreased awareness of deficits Awareness: Intellectual Problem Solving: Slow processing;Decreased initiation;Difficulty sequencing;Requires verbal cues;Requires tactile cues General Comments: Pt was confused overall.  Not following commands at all.  Pt easily distracted.  Very internally distracted.  Poor motor processing.   Rancho Levels of Cognitive Functioning [place]igos Scales of Cognitive Functioning: Confused/agitated    Extremity/Trunk Assessment Upper Extremity Assessment Upper Extremity Assessment: Defer to OT evaluation Lower Extremity Assessment Lower Extremity Assessment: Generalized weakness;Difficult to assess due to impaired cognition (Moves LES in bed but not to command)   Balance Balance Overall balance assessment: Needs assistance;History of Falls Sitting-balance support: Bilateral upper extremity supported;Feet supported Sitting balance-Leahy Scale: Poor Postural control: Posterior lean Standing balance support: Bilateral upper extremity supported;During functional activity Standing balance-Leahy Scale: Poor Standing balance comment: Pt unable to stand fully upright.  Only weight beared minimally on feet.    End of Session PT - End of Session Equipment Utilized During Treatment: Gait belt Activity Tolerance: Patient limited by fatigue;Treatment limited secondary to agitation Patient left: in chair;with call bell/phone within reach;with nursing/sitter in room;with chair alarm set;with restraints reapplied Nurse Communication: Mobility status;Need for lift equipment  GP     INGOLD,Alexandria Shiflett 09/22/2013, 10:58 AM [facility] Acute Rehabilitation [phone] (pager)

## 2013-09-22 NOTE — Progress Notes (Signed)
Patient ID: Bradley [name], male   DOB: January 15, 1956, 58 y.o.   MRN: [id number]         [facility] for Infectious Disease    Date of Admission:  09/08/2013    Total days of antibiotics 14         Principal Problem:   Sepsis Active Problems:   HYPOTHYROIDISM- TSH 7.5   Aortic valve replaced- [place] propceedure 2011   Hypertension   Depression   LBBB (left bundle branch block)   Major depressive disorder, recurrent, severe with psychotic features- hospitalized in Jan 2013   Generalized anxiety disorder   Altered mental state   Encephalitis   Septic embolism   HCAP (healthcare-associated pneumonia)   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   Acute diastolic heart failure   NSTEMI (non-ST elevated myocardial infarction)   Agitation   Acute respiratory alkalosis   Acute renal failure   Encephalopathy acute   Staphylococcus aureus bacteremia with sepsis   ST elevation (STEMI) myocardial infarction   . antiseptic oral rinse  15 mL Mouth Rinse q12n4p  . chlorhexidine  15 mL Mouth Rinse BID  . feeding supplement (RESOURCE BREEZE)  1 Container Oral TID WC  . heparin subcutaneous  5,000 Units Subcutaneous 3 times per day  . insulin aspart  0-9 Units Subcutaneous TID WC  . levothyroxine  200 mcg Oral QAC breakfast  . nafcillin IV  2 g Intravenous Q4H  . pantoprazole  40 mg Oral Q1200  . potassium chloride  20 mEq Oral Daily  . rifampin (RIFADIN) IVPB  300 mg Intravenous 3 times per day  . sevelamer carbonate  800 mg Oral TID WC  . sodium chloride  3 mL Intravenous Q12H   Objective: Temp:  [98.2 F (36.8 C)-100.6 F (38.1 C)] 98.5 F (36.9 C) (02/23 1638) Pulse Rate:  [95-110] 104 (02/23 1638) Resp:  [22-33] 22 (02/23 1638) BP: (134-159)/(84-102) 145/88 mmHg (02/23 1638) SpO2:  [93 %-96 %] 96 % (02/23 1638) Weight:  [102.6 kg (226 lb 3.1 oz)] 102.6 kg (226 lb 3.1 oz) (02/23 0400)  General: He is alert and confused.  Skin: No rash Lungs: Clear Cor: Regular [id number] 2/6 systolic  murmur heard best at the right sternal border Abdomen: Nontender   Lab Results Lab Results  Component Value Date   WBC 15.7* 09/20/2013   HGB 9.4* 09/20/2013   HCT 26.5* 09/20/2013   MCV 81.0 09/20/2013   PLT 244 09/20/2013    Lab Results  Component Value Date   CREATININE 9.00* 09/20/2013   BUN 71* 09/20/2013   NA 133* 09/20/2013   K 3.2* 09/20/2013   CL 93* 09/20/2013   CO2 19 09/20/2013    Lab Results  Component Value Date   ALT 19 09/20/2013   AST 24 09/20/2013   ALKPHOS 52 09/20/2013   BILITOT 1.1 09/20/2013      Microbiology: Recent Results (from the past 240 hour(s))  CLOSTRIDIUM DIFFICILE BY PCR     Status: None   Collection Time    09/15/13  2:00 AM      Result Value Ref Range Status   C difficile by pcr NEGATIVE  NEGATIVE Final   Comment: Performed at [facility]  MRSA PCR SCREENING     Status: None   Collection Time    09/15/13  5:31 PM      Result Value Ref Range Status   MRSA by PCR NEGATIVE  NEGATIVE Final   Comment:  The GeneXpert MRSA Assay (FDA     approved for NASAL specimens     only), is one component of a     comprehensive MRSA colonization     surveillance program. It is not     intended to diagnose MRSA     infection nor to guide or     monitor treatment for     MRSA infections.  CLOSTRIDIUM DIFFICILE BY PCR     Status: None   Collection Time    09/16/13 10:53 AM      Result Value Ref Range Status   C difficile by pcr NEGATIVE  NEGATIVE Final    Assessment: We have made some microbiologic progress on therapy for MSSA bacteremia and possible endocarditis but he continues to have multiorgan dysfunction.  Plan: 1. Continue nafcillin and rifampin  Michel Bickers, MD [facility] [place] [facility] [phone] pager   [phone] cell 09/22/2013, 5:17 PM

## 2013-09-22 NOTE — Progress Notes (Signed)
  [facility] Progress Note   Subjective: no UOP recorded, patient being fed by daughter  Danley Danker Vitals:   09/22/13 0000 09/22/13 0400 09/22/13 0600 09/22/13 0733  BP: 134/84 159/102  152/100  Pulse: 95 105  105  Temp: 100.6 F (38.1 C) 98.4 F (36.9 C)  99.5 F (37.5 C)  TempSrc: Oral Oral  Oral  Resp: 30 23 22 24   Height:      Weight:  102.6 kg (226 lb 3.1 oz)    SpO2: 96% 96%  95%  Exam Awake and alert, converses but minimally, poor memory of recent events, no distress No jvd R chest clear, faint rales L base RRR 2/6 SEM no gallop Abd soft, nt, nd Ext 1+ dependent edema bilat LE's Neuro is nf, Ox2  UA 09/08/13 > 100 prot, 7-10 wbc, 21-50 rbc, +gran casts, ++bact UA 09/17/13  > same   Assessment: 1 Acute kidney injury- oliguric ATN from gent vs immune-complex disease from infection; 1st HD 2/18 2 Staph aureus bacteremia / embolic strokes / hx tissue AVR 2011- suspected endocarditis 3 AMS- gradual improvement 4 HTN/ vol excess- BP's up, lower vol w HD, start meds if needed 5 Hyperphosphatemia- renagel ac  Plan- HD today, max UF 4kg as Ronnie Derby MD  pager [phone]    cell [phone]  09/22/2013, 11:20 AM     Recent Labs Lab 09/16/13 0335 09/17/13 0524 09/18/13 0330 09/19/13 0455 09/20/13 0555  NA 137 131* 135* 137 133*  K 4.0 3.7 4.0 3.6* 3.2*  CL 98 92* 93* 96 93*  CO2 17* 15* 20 22 19   GLUCOSE 127* 140* 132* 118* 110*  BUN 81* 90* 72* 64* 71*  CREATININE 7.60* 8.89* 7.87* 7.79* 9.00*  CALCIUM 7.0* 7.5* 7.1* 7.1* 7.1*  PHOS 6.0* 7.4*  --  6.7*  --     Recent Labs Lab 09/18/13 0330 09/19/13 0455 09/20/13 0555  AST 33 24 24  ALT 29 22 19   ALKPHOS 73 59 52  BILITOT 1.7* 1.7* 1.1  PROT 6.1 5.6* 5.3*  ALBUMIN 1.4* 1.4* 1.3*    Recent Labs Lab 09/17/13 1447 09/18/13 0330 09/19/13 0455 09/20/13 0555  WBC 24.7* 23.9* 18.4* 15.7*  NEUTROABS 23.0* 22.0* 16.9*  --   HGB 12.3* 12.7* 10.3* 9.4*  HCT 33.6* 34.5* 28.4*  26.5*  MCV 81.4 80.8 81.8 81.0  PLT 145* 205 214 244   . antiseptic oral rinse  15 mL Mouth Rinse q12n4p  . chlorhexidine  15 mL Mouth Rinse BID  . feeding supplement (RESOURCE BREEZE)  1 Container Oral TID WC  . heparin subcutaneous  5,000 Units Subcutaneous 3 times per day  . insulin aspart  0-9 Units Subcutaneous TID WC  . levothyroxine  200 mcg Oral QAC breakfast  . nafcillin IV  2 g Intravenous Q4H  . pantoprazole  40 mg Oral Q1200  . rifampin (RIFADIN) IVPB  300 mg Intravenous 3 times per day  . sevelamer carbonate  800 mg Oral TID WC  . sodium chloride  3 mL Intravenous Q12H     sodium chloride, sodium chloride, acetaminophen, acetaminophen, feeding supplement (NEPRO CARB STEADY), heparin, hydrOXYzine, lidocaine (PF), lidocaine-prilocaine, liver oil-zinc oxide, metoprolol, morphine injection, ondansetron (ZOFRAN) IV, pentafluoroprop-tetrafluoroeth, zolpidem

## 2013-09-22 NOTE — Evaluation (Signed)
Speech Language Pathology Evaluation Patient Details Name: Bradley [name] MRN: [id number] DOB: 04-15-1956 Today's Date: 09/22/2013 Time: [id number] SLP Time Calculation (min): 18 min  Problem List:  Patient Active Problem List   Diagnosis Date Noted  . ST elevation (STEMI) myocardial infarction 09/17/2013  . Acute respiratory alkalosis 09/13/2013  . Acute renal failure 09/13/2013  . Encephalopathy acute 09/13/2013  . Staphylococcus aureus bacteremia with sepsis 09/13/2013  . Agitation 09/12/2013  . Acute diastolic heart failure [id number]  . NSTEMI (non-ST elevated myocardial infarction) 09/11/2013  . Sepsis 09/09/2013  . Altered mental state 09/09/2013  . Encephalitis 09/09/2013  . Septic embolism 09/09/2013  . HCAP (healthcare-associated pneumonia) 09/09/2013  . 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 Hypothyroidism, postsurgical   . Influenza 09/06/2013  . Major depressive disorder, recurrent, severe with psychotic features- hospitalized in Jan 2013 08/20/2011  . Generalized anxiety disorder 08/20/2011  . Hypotension 08/17/2011  . Suicidal ideation 08/17/2011  . Depression 05/05/2011  . LBBB (left bundle branch block) 05/05/2011  . Rotator cuff tear, right 02/17/2011  . Aortic valve replaced- Bentall propceedure 2011 12/29/2010  . Hypertension 12/29/2010  . DEPRESSION 02/14/2010  . WEIGHT LOSS 02/14/2010  . HYPOTHYROIDISM- TSH 7.5 03/05/2009  . CHEST PAIN, EXERTIONAL 03/05/2009   Past Medical History:  Past Medical History  Diagnosis Date  . Anxiety   . Depression   . History of thyroid cancer   . Hypertension   . Hypothyroidism, postsurgical   . Aortic stenosis     s/p Bentall with bioprosthetic AVR 02/2010; Last echo (9/11): Moderate LVH, EF 45-50%, AVR functioning appropriately, aortic valve mean gradient 21, diastolic dysfunction. Chest MRA (2/13): Mild to moderate dilatation at the sinus of Valsalva at 4.1 cm, mild dilatation ascending aorta distal to the tube  graft at 3.9 cm, moderate dilatation of the innominate artery a 2.1 cm;    . Hx of cardiac cath     a. LHC in 02/2010: normal cors  . Hx of echocardiogram 2014    Echo (10/14): Severe LVH, EF 60-65%, normal wall motion, grade 1 diastolic dysfunction, AVR functioning normally, mild aortic stenosis (mean 19), moderately dilated aorta, mild LAE, mild RVE   Past Surgical History:  Past Surgical History  Procedure Laterality Date  . Cardiac catheterization  03/02/2010    NORMAL CORONARY ARTERY  . Sternotomy      REDO  . Transthoracic echocardiogram  03/2010    SHOWED MILD REDUCTION OF LV FUNCTION  . Thyroidectomy    . Rotator cuff repair  2012    Right   HPI:  58 year old male with history of aortic stenosis, status post pediatric aortic valvulotomy at age 76 followed by tissue AVR (Bentall procedure) 02/2000 with cardiac cath 02/2010 with normal coronaries, chronic LBBB, thyroid cancer post thyroidectomy >> hypothyroidism, HTN, and depression, who had been generally unwell for 2-3 weeks. He was seen in the ED on 09/06/13 and assessed as influenza related vomiting, diarrhea, abdominal pain, body aches, fever and mild confusion and was ultimately discharged on Tamiflu. Patient worsened at home with progressive confusion/disorientation and was admitted with sepsis, septic brain emboli and rule out infective endocarditis. MRI findings: MRI Brain (2/10) with multiple bilateral supra tentorial and right cerebellar regions c/w acute partially hemorrhagic infarcts suspected 2/2 septic emboli   Assessment / Plan / Recommendation Clinical Impression  Pt demonstrates primary cognitive deficits with moderate verbal cues required to capture focused attention to basic tasks. There were no signs of aphasia and no perseverations or paraphasias,  but pt did require repetition of questions for naming and reading due to fleeting attention and internal distractors. There was mild lanaguage of confusion with open ended  questions, but pt was able to verbalize basic biographical information with choices. Pt will need continued SLP therapy for attention, memory and participation in basic ADLs. Agree with CIR consult.     SLP Assessment  Patient needs continued Speech Lanaguage Pathology Services    Follow Up Recommendations  Inpatient Rehab    Frequency and Duration min 2x/week  2 weeks   Pertinent Vitals/Pain NA   SLP Goals  SLP Goals Potential to Achieve Goals: Good  SLP Evaluation Prior Functioning  Cognitive/Linguistic Baseline: Information not available Type of Home: House  Lives With: Spouse Available Help at Discharge: Family;Available 24 hours/day   Cognition  Overall Cognitive Status: Impaired/Different from baseline Arousal/Alertness: Awake/alert Orientation Level: Oriented to person;Disoriented to place;Disoriented to time;Disoriented to situation Attention: Focused;Sustained Focused Attention: Impaired Focused Attention Impairment: Verbal basic;Functional basic Sustained Attention: Impaired Sustained Attention Impairment: Verbal basic;Functional basic Memory: Impaired Awareness: Impaired Awareness Impairment: Intellectual impairment;Emergent impairment;Anticipatory impairment Problem Solving: Impaired Problem Solving Impairment: Verbal basic;Functional basic Behaviors: Restless Safety/Judgment: Impaired    Comprehension  Auditory Comprehension Overall Auditory Comprehension: Impaired Yes/No Questions: Within Functional Limits Commands: Impaired One Step Basic Commands: 50-74% accurate Two Step Basic Commands: 0-24% accurate Conversation: Simple Interfering Components: Attention Reading Comprehension Reading Status: Impaired Word level: Within functional limits Sentence Level: Within functional limits Paragraph Level: Impaired Interfering Components: Attention    Expression Expression Primary Mode of Expression: Verbal Verbal Expression Initiation: No  impairment Automatic Speech: Name;Social Response (needed cues, unable to count iwth max cues) Level of Generative/Spontaneous Verbalization: Phrase Repetition: No impairment Naming: Impairment Responsive: Not tested Confrontation:  (needed extra time and repeition) Convergent: Not tested Divergent: Not tested Pragmatics: No impairment Interfering Components: Attention Written Expression Dominant Hand: Right   Oral / Motor Oral Motor/Sensory Function Overall Oral Motor/Sensory Function: Appears within functional limits for tasks assessed Motor Speech Overall Motor Speech: Appears within functional limits for tasks assessed   GO    Herbie Baltimore, MA CCC-SLP [phone]  Lynann Beaver 09/22/2013, 4:11 PM

## 2013-09-22 NOTE — Progress Notes (Signed)
[name]  Bradley [name] [id number] DOB: [birth year] DOA: 09/08/2013 PCP: [name], MD  Brief narrative: 58 year old male with history of aortic stenosis, status post pediatric aortic valvulotomy at age 62 followed by tissue AVR (Bentall procedure) 02/2000 with cardiac cath 02/2010 with normal coronaries, chronic LBBB, thyroid cancer post thyroidectomy >> hypothyroidism, HTN, and depression, who had been generally unwell for 2-3 weeks.  He was seen in the ED on 09/06/13 and assessed as influenza related vomiting, diarrhea, abdominal pain, body aches, fever and mild confusion and was ultimately discharged on Tamiflu. Patient worsened at home with progressive confusion/disorientation and was admitted with sepsis, septic brain emboli and rule out infective endocarditis.  SIGNIFICANT EVENTS / STUDIES:  2/09 - admit  2/10 - ID, Heme and Cards consults. TTE EF 45%. No clear vegetations  2/14 - PCCM consult  2/15 - Junctional rhythm with precedex, mental status improved. Transferred to [facility] for possible HD  2/18 - transferred to ICU for diaphoresis, ST changes and confusion  LINES / TUBES:  Left Midline 09/13/13>>  L IJ HD catheter 2/18 >> pt pulled out 2/21 L [place] HD catheter 2/21  HPI/Subjective: Pt remains alert - less conversant but this seems to correlate with his confusion.  Requiring mittens for safety. Restless.    Assessment/Plan:  MSSA bacteremia w/ sepsis / infective endocarditis w/ cerebral septic emboli / UTI -Blood cultures x 2 MSSA - f/u cultures negative on abx -Chest x-ray suggested early pneumonia of left lung base with small left pleural effusion -UA cx with MSSA UTI -TTE (2/10) without vegetation - TEE planned when other medical issues stabilized, possibly this week- has prior tissue AVR -ID directing abx  Toxic metabolic encephalopathy -secondary to severe sepsis, azotemia, and septic brain emboli -MRI Brain (2/10) with  multiple bilateral supra tentorial and right cerebellar regions c/w acute partially hemorrhagic infarcts suspected 2/2 septic emboli -Continue supportive care and tx underlying causes -Neurology has evaluated -QTc prolonged so haldol not safe  -[id number], folic acid [id number], and ammonia 17  Severe thrombocytopenia  -Platelet counts dropped to a nadir of 23 - felt to be due to sepsis - now resolved  Acute Renal Failure/Acute tubular necrosis -due to bacteremia/immune complex or gentamicin -baseline renal fnx: 18 / 0.93 - progressive failure since admit - Nephrology following -Transferred to [name] 09/15/2013 for hemodialysis -Hemodialysis started 09/17/2013 -Renal US WNL   Abdominal distention/diarrhea -distention improved, but diarrhea continues - flexiseal as pt has been "playing in his stool" - diarrhea likely due to abx - C diff negative 2/16 and 2/17   Hypokalemia  - carefully replete prn  NSTEMI (Type II) -peak TNI 2.26 - no plans for cath at this point - has stable ST elevation r/t LBBB  Acute Systolic HF and Chronic grade 1 Diastolic CHF  -ECHO Oct [id number] with EF 60-65% with severe LVH and no RWMA -ECHO Sep 09, 2013 EF 45-50% with septal and apical hypokinesis  History of thyroid cancer, post thyroidectomy hypothyroidism  -TSH elevated at 10.174 - Synthroid increased - repeat labs in 4-6 weeks  Hypertension  -Continue when necessary IV metoprolol - Holding lisinopril due to acute renal failure  Hyperglycemia  -Hemoglobin A1c = 5.7 - follow trend   History of depression  -hold Zoloft secondary to acute encephalopathy.   Hyponatremia  -due to volume overload - ongoing HD should correct   DVT prophylaxis: SCDs >> SQ heparin Code Status: Full Family Communication:  No family at bedside Disposition:  SDU  Consultants: Cardiology PCCM Nephrology Neurology Infectious disease Hematology  Antibiotics: Nafcillin 2/10>> Rifampin 2/15 >> Vancomycin  2/10>>2/11 Gentamicin 2/10>> 2/13   Objective: Blood pressure 152/100, pulse 106, temperature 98.2 F (36.8 C), temperature source Oral, resp. rate 24, height 6\' 1"  (1.854 m), weight 226 lb 3.1 oz (102.6 kg), SpO2 93.00%.  Intake/Output Summary (Last 24 hours) at 09/22/13 1308 Last data filed at 09/22/13 [id number]  Gross per 24 hour  Intake   1900 ml  Output    200 ml  Net   1700 ml   Exam: General: No acute respiratory distress - alert but confused  Lungs: Clear to auscultation bilaterally without wheezes or crackles, RA Cardiovascular: RRR w/ 2/6 holosystolic M w/o gallup or rub  Abdomen: Nontender, nondistended, soft, bowel sounds positive, no rebound, no ascites, no appreciable mass Musculoskeletal: No significant cyanosis, clubbing of bilateral lower extremities - 1+ B LE edema  Neurological: Alert but not oriented  Scheduled Meds:  Scheduled Meds: . antiseptic oral rinse  15 mL Mouth Rinse q12n4p  . chlorhexidine  15 mL Mouth Rinse BID  . feeding supplement (RESOURCE BREEZE)  1 Container Oral TID WC  . heparin subcutaneous  5,000 Units Subcutaneous 3 times per day  . insulin aspart  0-9 Units Subcutaneous TID WC  . levothyroxine  200 mcg Oral QAC breakfast  . nafcillin IV  2 g Intravenous Q4H  . pantoprazole  40 mg Oral Q1200  . potassium chloride  20 mEq Oral Daily  . rifampin (RIFADIN) IVPB  300 mg Intravenous 3 times per day  . sevelamer carbonate  800 mg Oral TID WC  . sodium chloride  3 mL Intravenous Q12H   Data Reviewed: Basic Metabolic Panel:  Recent Labs Lab 09/16/13 0335 09/17/13 0524 09/18/13 0330 09/19/13 0455 09/20/13 0555  NA 137 131* 135* 137 133*  K 4.0 3.7 4.0 3.6* 3.2*  CL 98 92* 93* 96 93*  CO2 17* 15* 20 22 19   GLUCOSE 127* 140* 132* 118* 110*  BUN 81* 90* 72* 64* 71*  CREATININE 7.60* 8.89* 7.87* 7.79* 9.00*  CALCIUM 7.0* 7.5* 7.1* 7.1* 7.1*  MG 2.6* 2.8*  --  2.2  --   PHOS 6.0* 7.4*  --  6.7*  --     Liver Function Tests:  Recent  Labs Lab 09/16/13 0335 09/17/13 0524 09/18/13 0330 09/19/13 0455 09/20/13 0555  AST 26 35 33 24 24  ALT 22 29 29 22 19   ALKPHOS 86 91 73 59 52  BILITOT 1.7* 1.7* 1.7* 1.7* 1.1  PROT 5.7* 6.8 6.1 5.6* 5.3*  ALBUMIN 1.5* 1.6* 1.4* 1.4* 1.3*   CBC:  Recent Labs Lab 09/16/13 0335 09/17/13 0524 09/17/13 1447 09/18/13 0330 09/19/13 0455 09/20/13 0555  WBC 21.8* 26.2* 24.7* 23.9* 18.4* 15.7*  NEUTROABS 19.8* 24.1* 23.0* 22.0* 16.9*  --   HGB 12.9* 14.8 12.3* 12.7* 10.3* 9.4*  HCT 35.0* 40.9 33.6* 34.5* 28.4* 26.5*  MCV 82.2 82.8 81.4 80.8 81.8 81.0  PLT 92* 165 145* 205 214 244   Cardiac Enzymes:  Recent Labs Lab 09/17/13 1231 09/17/13 1830  TROPONINI 0.38* 0.36*   CBG:  Recent Labs Lab 09/21/13 1234 09/21/13 1646 09/21/13 2123 09/22/13 0810 09/22/13 1228  GLUCAP 124* 156* 156* 110* 140*    Recent Results (from the past 240 hour(s))  CLOSTRIDIUM DIFFICILE BY PCR     Status: None   Collection Time    09/15/13  2:00 AM  Result Value Ref Range Status   C difficile by pcr NEGATIVE  NEGATIVE Final   Comment: Performed at [facility]  MRSA PCR SCREENING     Status: None   Collection Time    09/15/13  5:31 PM      Result Value Ref Range Status   MRSA by PCR NEGATIVE  NEGATIVE Final   Comment:            The GeneXpert MRSA Assay (FDA     approved for NASAL specimens     only), is one component of a     comprehensive MRSA colonization     surveillance program. It is not     intended to diagnose MRSA     infection nor to guide or     monitor treatment for     MRSA infections.  CLOSTRIDIUM DIFFICILE BY PCR     Status: None   Collection Time    09/16/13 10:53 AM      Result Value Ref Range Status   C difficile by pcr NEGATIVE  NEGATIVE Final     Studies:  Recent x-ray studies have been reviewed in detail by the Attending Physician  Time spent : 35 mins  Erin Hearing, ANP [facility] For Consults/Admissions - Flow Manager -  [phone] Office  [phone] Pager [phone]  On-Call/Text Page:      [email or web address]      password [facility]  09/22/2013, 1:08 PM   LOS: 14 days   I have personally examined this patient and reviewed the entire database. I have reviewed the above note, made any necessary editorial changes, and agree with its content.  Cherene Altes, MD [facility]

## 2013-09-23 DIAGNOSIS — I6340 Cerebral infarction due to embolism of unspecified cerebral artery: Secondary | ICD-10-CM

## 2013-09-23 LAB — GLUCOSE, CAPILLARY
Glucose-Capillary: 97 mg/dL (ref 70–99)
Glucose-Capillary: 97 mg/dL (ref 70–99)
Glucose-Capillary: 242 mg/dL — ABNORMAL HIGH (ref 70–99)
Glucose-Capillary: 196 mg/dL — ABNORMAL HIGH (ref 70–99)
Glucose-Capillary: 100 mg/dL — ABNORMAL HIGH (ref 70–99)

## 2013-09-23 LAB — CBC
HCT: 24.8 % — ABNORMAL LOW (ref 39.0–52.0)
Hemoglobin: 9 g/dL — ABNORMAL LOW (ref 13.0–17.0)
MCH: 29.3 pg (ref 26.0–34.0)
MCHC: 36.3 g/dL — ABNORMAL HIGH (ref 30.0–36.0)
MCV: 80.8 fL (ref 78.0–100.0)
Platelets: 275 10*3/uL (ref 150–400)
RBC: 3.07 MIL/uL — ABNORMAL LOW (ref 4.22–5.81)
RDW: 16.6 % — ABNORMAL HIGH (ref 11.5–15.5)
WBC: 15.3 10*3/uL — ABNORMAL HIGH (ref 4.0–10.5)

## 2013-09-23 LAB — BASIC METABOLIC PANEL
BUN: 31 mg/dL — ABNORMAL HIGH (ref 6–23)
CO2: 23 mEq/L (ref 19–32)
Calcium: 7.6 mg/dL — ABNORMAL LOW (ref 8.4–10.5)
Chloride: 98 mEq/L (ref 96–112)
Creatinine, Ser: 6.42 mg/dL — ABNORMAL HIGH (ref 0.50–1.35)
GFR calc Af Amer: 10 mL/min — ABNORMAL LOW (ref >90)
GFR calc non Af Amer: 9 mL/min — ABNORMAL LOW (ref >90)
Glucose, Bld: 112 mg/dL — ABNORMAL HIGH (ref 70–99)
Potassium: 3.5 mEq/L — ABNORMAL LOW (ref 3.7–5.3)
Sodium: 138 mEq/L (ref 137–147)

## 2013-09-23 LAB — PROTIME-INR
INR: 1.34 (ref 0.00–1.49)
Prothrombin Time: 16.3 seconds — ABNORMAL HIGH (ref 11.6–15.2)

## 2013-09-23 MED ORDER — SEVELAMER CARBONATE 800 MG PO TABS
1600.0000 mg | ORAL_TABLET | Freq: Three times a day (TID) | ORAL | Status: DC
  Administered 2013-09-25 – 2013-09-27 (×5): 1600 mg via ORAL
  Filled 2013-09-23 (×20): qty 2

## 2013-09-23 NOTE — Consult Note (Signed)
Physical Medicine and Rehabilitation Consult Reason for Consult: MSSA bacteremia Referring Physician: [facility]   HPI: Bradley [name] is a 58 y.o. right-handed male with complicated medical history including aortic stenosis, status post pediatric aortic valvulotomy at HTN followed by tissue AVR (Bentall procedure) 02/2000 with cardiac cath 8 2011 with normal coronaries, hypertension. Patient is seen in the emergency department 09/06/2013 assessed for influenza related to vomiting, diarrhea abdominal pain and body aches with fever was discharged home on Tamiflu. Presented 09/09/2013 with progressive confusion disorientation and was admitted for suspect sepsis. MRI of the brain showed multiple bilateral supratentorial and right cerebellar regions of restricted motion some of which are partially hemorrhagic with larger area left parietal lobe . In the patients clinical setting these findings were consistent with acute partially hemorrhagic infarct and possible related septic emboli. Echocardiogram showed ejection fraction of 50% with no AR or perivalvular leak and normal systolic gradients. Plan TEE held due to low platelets. Blood cultures x2 MSSA and maintain on broad-spectrum antibiotics. Infectious disease followup maintained on nafcillin and rifampin. Hospital course acute renal failure felt to be secondary to ATN with followup renal services consulted 15 2015 and placed on gentle IV fluids. Renal ultrasound showed no hydronephrosis. Creatinine elevated 9.36 and hemodialysis had been initiated 09/17/2013. Subcutaneous heparin for DVT prophylaxis. Mental status continues to improve suspect toxic metabolic encephalopathy. Physical and occupational therapy evaluations completed 09/22/2013 with recommendations for physical medicine rehabilitation consult  Review of Systems  Unable to perform ROS: mental acuity   Past Medical History  Diagnosis Date  . Anxiety   . Depression   . History of thyroid  cancer   . Hypertension   . Hypothyroidism, postsurgical   . Aortic stenosis     s/p Bentall with bioprosthetic AVR 02/2010; Last echo (9/11): Moderate LVH, EF 45-50%, AVR functioning appropriately, aortic valve mean gradient 21, diastolic dysfunction. Chest MRA (2/13): Mild to moderate dilatation at the sinus of Valsalva at 4.1 cm, mild dilatation ascending aorta distal to the tube graft at 3.9 cm, moderate dilatation of the innominate artery a 2.1 cm;    . Hx of cardiac cath     a. LHC in 02/2010: normal cors  . Hx of echocardiogram 2014    Echo (10/14): Severe LVH, EF 60-65%, normal wall motion, grade 1 diastolic dysfunction, AVR functioning normally, mild aortic stenosis (mean 19), moderately dilated aorta, mild LAE, mild RVE   Past Surgical History  Procedure Laterality Date  . Cardiac catheterization  03/02/2010    NORMAL CORONARY ARTERY  . Sternotomy      REDO  . Transthoracic echocardiogram  03/2010    SHOWED MILD REDUCTION OF LV FUNCTION  . Thyroidectomy    . Rotator cuff repair  2012    Right   Family History  Problem Relation Age of Onset  . Heart disease Father    Social History:  reports that he has never smoked. He has never used smokeless tobacco. He reports that he does not drink alcohol or use illicit drugs. Allergies: No Known Allergies Medications Prior to Admission  Medication Sig Dispense Refill  . acetaminophen (TYLENOL) 500 MG tablet Take 500 mg by mouth every 6 (six) hours as needed for mild pain or fever.      Marland Kitchen aspirin 325 MG tablet Take 325 mg by mouth daily.        Marland Kitchen ibuprofen (ADVIL,MOTRIN) 200 MG tablet Take 200 mg by mouth every 6 (six) hours as needed for  fever.      . levothyroxine (SYNTHROID, LEVOTHROID) 175 MCG tablet Take 175 mcg by mouth daily.      Marland Kitchen lisinopril (PRINIVIL,ZESTRIL) 10 MG tablet Take 1 tablet (10 mg total) by mouth daily.  30 tablet  11  . metoprolol (LOPRESSOR) 50 MG tablet Take 25 mg by mouth 2 (two) times daily.      . multivitamin  (THERAGRAN) per tablet Take 1 tablet by mouth daily.        Marland Kitchen oseltamivir (TAMIFLU) 75 MG capsule Take 1 capsule (75 mg total) by mouth every 12 (twelve) hours.  10 capsule  0  . sertraline (ZOLOFT) 50 MG tablet Take 50 mg by mouth daily.        Home: Home Living Family/patient expects to be discharged to:: Private residence Living Arrangements: Spouse/significant other Available Help at Discharge: Family;Available 24 hours/day Type of Home: House Home Access: Stairs to enter [employer] of Steps: 5 Entrance Stairs-Rails: Can reach both Home Layout: One level Home Equipment: Shower seat - built in Additional Comments: daughter Judson Roch at bedside  Lives With: Spouse  Functional History:   Functional Status:  Mobility:          ADL: ADL Eating/Feeding: +1 Total assistance Where Assessed - Eating/Feeding: Bed level Grooming: Wash/dry hands;Wash/dry face;+1 Total assistance Where Assessed - Grooming: Supine, head of bed up ADL Comments: Total assist for bathing and dressing in supported sitting or a bedlevel.  Cognition: Cognition Overall Cognitive Status: Impaired/Different from baseline Arousal/Alertness: Awake/alert Orientation Level: Disoriented X4 Attention: Focused;Sustained Focused Attention: Impaired Focused Attention Impairment: Verbal basic;Functional basic Sustained Attention: Impaired Sustained Attention Impairment: Verbal basic;Functional basic Memory: Impaired Awareness: Impaired Awareness Impairment: Intellectual impairment;Emergent impairment;Anticipatory impairment Problem Solving: Impaired Problem Solving Impairment: Verbal basic;Functional basic Behaviors: Restless Safety/Judgment: Impaired [place] [employer] Scales of Cognitive Functioning: Confused/agitated Cognition Arousal/Alertness: Lethargic Behavior During Therapy: Restless Overall Cognitive Status: Impaired/Different from baseline Area of Impairment:  Orientation;Attention;Memory;Following commands;Safety/judgement;Awareness;Problem solving;Rancho level Orientation Level: Disoriented to;Place;Time;Situation Current Attention Level: Focused Memory: Decreased short-term memory Following Commands: Follows one step commands inconsistently;Follows one step commands with increased time Safety/Judgement: Decreased awareness of safety;Decreased awareness of deficits Awareness: Intellectual Problem Solving: Slow processing;Decreased initiation;Difficulty sequencing;Requires verbal cues;Requires tactile cues General Comments: Pt was confused overall.  Not following commands at all.  Pt easily distracted.  Very internally distracted.  Poor motor processing.    Blood pressure 145/88, pulse 107, temperature 99.2 F (37.3 C), temperature source Oral, resp. rate 17, height 6\' 1"  (1.854 m), weight 103.1 kg (227 lb 4.7 oz), SpO2 96.00%. Physical Exam  Constitutional: He appears well-developed.  HENT:  Head: Normocephalic.  Eyes: EOM are normal.  Neck: Normal range of motion. Neck supple. No thyromegaly present.  Cardiovascular: Normal rate and regular rhythm.   Respiratory: Effort normal and breath sounds normal. No respiratory distress.  GI: Soft. Bowel sounds are normal. He exhibits no distension.  Neurological: He is alert.  Pleasantly confused bilateral mittens in place. He needed multiple cues to name place. He was able to provide his age but not date of birth. Very distracted. Poor processing. Able to ID simple objects. Moves all 4's but has substantial difficulty initiating movements. Makes little eye contact  Psychiatric:  Pleasant, attempts to cooperate when he can focus.    Results for orders placed during the hospital encounter of 09/08/13 (from the past 24 hour(s))  GLUCOSE, CAPILLARY     Status: Abnormal   Collection Time    09/22/13  8:10 AM  Result Value Ref Range   Glucose-Capillary 110 (*) 70 - 99 mg/dL  GLUCOSE, CAPILLARY      Status: Abnormal   Collection Time    09/22/13 12:28 PM      Result Value Ref Range   Glucose-Capillary 140 (*) 70 - 99 mg/dL  GLUCOSE, CAPILLARY     Status: Abnormal   Collection Time    09/22/13  4:41 PM      Result Value Ref Range   Glucose-Capillary 146 (*) 70 - 99 mg/dL  RENAL FUNCTION PANEL     Status: Abnormal   Collection Time    09/22/13  8:10 PM      Result Value Ref Range   Sodium 134 (*) 137 - 147 mEq/L   Potassium 3.0 (*) 3.7 - 5.3 mEq/L   Chloride 93 (*) 96 - 112 mEq/L   CO2 19  19 - 32 mEq/L   Glucose, Bld 132 (*) 70 - 99 mg/dL   BUN 55 (*) 6 - 23 mg/dL   Creatinine, Ser 9.36 (*) 0.50 - 1.35 mg/dL   Calcium 7.5 (*) 8.4 - 10.5 mg/dL   Phosphorus 8.2 (*) 2.3 - 4.6 mg/dL   Albumin 1.2 (*) 3.5 - 5.2 g/dL   GFR calc non Af Amer 6 (*) >90 mL/min   GFR calc Af Amer 6 (*) >90 mL/min  GLUCOSE, CAPILLARY     Status: None   Collection Time    09/22/13 10:49 PM      Result Value Ref Range   Glucose-Capillary 97  70 - 99 mg/dL   Comment 1 Notify RN     Comment 2 Documented in Chart    PROTIME-INR     Status: Abnormal   Collection Time    09/23/13  3:39 AM      Result Value Ref Range   Prothrombin Time 16.3 (*) 11.6 - 15.2 seconds   INR 1.34  0.00 - 1.49  CBC     Status: Abnormal   Collection Time    09/23/13  3:39 AM      Result Value Ref Range   WBC 15.3 (*) 4.0 - 10.5 K/uL   RBC 3.07 (*) 4.22 - 5.81 MIL/uL   Hemoglobin 9.0 (*) 13.0 - 17.0 g/dL   HCT 24.8 (*) 39.0 - 52.0 %   MCV 80.8  78.0 - 100.0 fL   MCH 29.3  26.0 - 34.0 pg   MCHC 36.3 (*) 30.0 - 36.0 g/dL   RDW 16.6 (*) 11.5 - 15.5 %   Platelets 275  150 - 400 K/uL  BASIC METABOLIC PANEL     Status: Abnormal   Collection Time    09/23/13  3:39 AM      Result Value Ref Range   Sodium 138  137 - 147 mEq/L   Potassium 3.5 (*) 3.7 - 5.3 mEq/L   Chloride 98  96 - 112 mEq/L   CO2 23  19 - 32 mEq/L   Glucose, Bld 112 (*) 70 - 99 mg/dL   BUN 31 (*) 6 - 23 mg/dL   Creatinine, Ser 6.42 (*) 0.50 - 1.35 mg/dL     Calcium 7.6 (*) 8.4 - 10.5 mg/dL   GFR calc non Af Amer 9 (*) >90 mL/min   GFR calc Af Amer 10 (*) >90 mL/min   No results found.  Assessment/Plan: Diagnosis: bi-cerebral embolic infarct,? septic 1. Does the need for close, 24 hr/day medical supervision in concert with the patient's rehab needs make it unreasonable  for this patient to be served in a less intensive setting? Yes 2. Co-Morbidities requiring supervision/potential complications: htn, AVR, septic emboli 3. Due to bladder management, bowel management, safety, skin/wound care, disease management, medication administration, pain management and patient education, does the patient require 24 hr/day rehab nursing? Yes 4. Does the patient require coordinated care of a physician, rehab nurse, PT (1-2 hrs/day, 5 days/week), OT (1-2 hrs/day, 5 days/week) and SLP (1-2 hrs/day, 5 days/week) to address physical and functional deficits in the context of the above medical diagnosis(es)? Yes Addressing deficits in the following areas: balance, endurance, locomotion, strength, transferring, bowel/bladder control, bathing, dressing, feeding, grooming, toileting, cognition, speech, language, swallowing and psychosocial support 5. Can the patient actively participate in an intensive therapy program of at least 3 hrs of therapy per day at least 5 days per week? Yes 6. The potential for patient to make measurable gains while on inpatient rehab is excellent 7. Anticipated functional outcomes upon discharge from inpatient rehab are supervision with PT, supervision to min assist with OT, supervision to min assist with SLP. 8. Estimated rehab length of stay to reach the above functional goals is: 14-20 days 9. Does the patient have adequate social supports to accommodate these discharge functional goals? Yes 10. Anticipated D/C setting: Home 11. Anticipated post D/C treatments: [place] therapy 12. Overall Rehab/Functional Prognosis:  excellent  RECOMMENDATIONS: This patient's condition is appropriate for continued rehabilitative care in the following setting: CIR Patient has agreed to participate in recommended program. Yes Note that insurance prior authorization may be required for reimbursement for recommended care.  Comment: Rehab Admissions Coordinator to follow up.  Thanks,  Meredith Staggers, MD, Mellody Drown     09/23/2013

## 2013-09-23 NOTE — Progress Notes (Signed)
Physical Therapy Treatment Patient Details Name: Bradley [name] MRN: [id number] DOB: Jul 14, 1956 Today's Date: 09/23/2013 Time: [id number] PT Time Calculation (min): 25 min  PT Assessment / Plan / Recommendation  History of Present Illness Pt admit with sepsis and r/o endocarditis.  Anoxic brain injury.   PT Comments   Pt admitted with above. Pt currently with functional limitations due to balance, cognitive and endurance deficits. Pt following commands today with a delay in response time.  Pt mobilizing better as well.  Dr. Naaman Plummer in and hopeful that pt can get to Rehab as pt is an excellent candidate.    Pt will benefit from skilled PT to increase their independence and safety with mobility to allow discharge to the venue listed below.  Follow Up Recommendations  CIR;Supervision/Assistance - 24 hour     Does the patient have the potential to tolerate intense rehabilitation     Barriers to Discharge        Equipment Recommendations  Other (comment) (TBA)    Recommendations for Other Services Rehab consult  Frequency Min 3X/week   Progress towards PT Goals Progress towards PT goals: Progressing toward goals  Plan Current plan remains appropriate    Precautions / Restrictions Precautions Precautions: Fall Restrictions Weight Bearing Restrictions: No   Pertinent Vitals/Pain VSS, No pain    Mobility  Bed Mobility Overal bed mobility: Needs Assistance;+2 for physical assistance Bed Mobility: Supine to Sit Supine to sit: Max assist;+2 for physical assistance General bed mobility comments: Pt with delayed responses up to 20 seconds however was able to follow command to move LEs off bed.Even with incr time did not initiate sitting up.    Transfers Overall transfer level: Needs assistance Equipment used: 2 person hand held assist Transfers: Sit to/from [facility] Sit to Stand: Max assist;+2 physical assistance;From elevated surface Stand pivot transfers: Mod  assist;+2 physical assistance;From elevated surface General transfer comment: Pt initially did not stand all the way up to command even with +2 assist and cues.  However on second attempt with incr time, pt stood all the way up after a 12 second delay in partial stand and did step around to the chair with good pivotal steps and good postural stability for the transfer.  Pt even controlled descent somewhat into the chair.  Wife present and very tearful as she saw that pt impaired in his abilities.  Provided wife with emotional support.      Exercises General Exercises - Lower Extremity Long Arc Quad: AROM;Both;5 reps;Seated Hip Flexion/Marching: AROM;5 reps;Both;Seated Other Exercises Other Exercises: Wife sitting beside pt after pt assisted to chair by PT and tech and she is to encourage pt to continue to perform exercises.     PT Diagnosis:    PT Problem List:   PT Treatment Interventions:     PT Goals (current goals can now be found in the care plan section)    Visit Information  Last PT Received On: 09/23/13 Assistance Needed: +2 History of Present Illness: Pt admit with sepsis and r/o endocarditis.  Anoxic brain injury.    Subjective Data  Subjective: Pt not responding appropriately to questions.  Very internally distracted.     Cognition  Cognition Arousal/Alertness: Awake/alert Behavior During Therapy: Restless Overall Cognitive Status: Impaired/Different from baseline Area of Impairment: Orientation;Attention;Memory;Following commands;Safety/judgement;Awareness;Problem solving;Rancho level Orientation Level: Disoriented to;Place;Time;Situation Current Attention Level: Focused Memory: Decreased short-term memory Following Commands: Follows one step commands inconsistently;Follows one step commands with increased time Safety/Judgement: Decreased awareness of safety;Decreased awareness of  deficits Awareness: Intellectual Problem Solving: Slow processing;Decreased  initiation;Difficulty sequencing;Requires verbal cues;Requires tactile cues General Comments: Pt was confused overall.  Pt easily distracted.  Very internally distracted.  Poor motor processing.  Delayed response time to follow commands today up to 20 second delay. Probably apraxia noted as well. Rancho Levels of Cognitive Functioning [place] Scales of Cognitive Functioning: Confused/agitated    Balance  Balance Overall balance assessment: Needs assistance;History of Falls Sitting-balance support: Bilateral upper extremity supported;Feet supported Sitting balance-Leahy Scale: Fair Standing balance support: Bilateral upper extremity supported;During functional activity Standing balance-Leahy Scale: Poor Standing balance comment: Pt did stand fully upright with +2 assist secondary to pt with decr awareness of deficits, impulsivity and motor processing problems as well as apraxia.    End of Session PT - End of Session Equipment Utilized During Treatment: Gait belt Activity Tolerance: Patient limited by fatigue;Treatment limited secondary to agitation Patient left: in chair;with call bell/phone within reach;with family/visitor present;with chair alarm set Nurse Communication: Mobility status;Need for lift equipment   GP     INGOLD,Bunny Lowdermilk 09/23/2013, 1:09 PM [facility] [phone] (pager)

## 2013-09-23 NOTE — Progress Notes (Signed)
[name]  Bradley [name] [id number] DOB: January 06, 1956 DOA: 09/08/2013 PCP: [name], MD  Brief narrative: 58 year old male with history of aortic stenosis, status post pediatric aortic valvulotomy at age 86 followed by tissue AVR (Bentall procedure) 02/2000 with cardiac cath 02/2010 with normal coronaries, chronic LBBB, thyroid cancer post thyroidectomy >> hypothyroidism, HTN, and depression, who had been generally unwell for 2-3 weeks.  He was seen in the ED on 09/06/13 and assessed as influenza related vomiting, diarrhea, abdominal pain, body aches, fever and mild confusion and was ultimately discharged on Tamiflu. Patient worsened at home with progressive confusion/disorientation and was admitted with sepsis, septic brain emboli and rule out infective endocarditis.  SIGNIFICANT EVENTS / STUDIES:  2/09 - admit  2/10 - ID, Heme and Cards consults. TTE EF 45%. No clear vegetations  2/14 - PCCM consult  2/15 - Junctional rhythm with precedex, mental status improved. Transferred to [facility] for possible HD  2/18 - transferred to ICU for diaphoresis, ST changes and confusion  LINES / TUBES:  Left Midline 09/13/13>>  L IJ HD catheter 2/18 >> pt pulled out 2/21 L [place] HD catheter 2/21  HPI/Subjective: More sleepy today but once awakened was able to verbally communicate although remains confused.    Assessment/Plan:  MSSA bacteremia w/ sepsis / infective endocarditis w/ cerebral septic emboli / UTI -Blood cultures x 2 MSSA - f/u cultures negative on abx -Chest x-ray suggested early pneumonia of left lung base with small left pleural effusion -UA cx with MSSA UTI -TTE (2/10) without vegetation -awaiting TEE at some point this week -has prior tissue AVR -ID directing abx  Toxic metabolic encephalopathy -secondary to severe sepsis, azotemia, and septic brain emboli -MRI Brain (2/10) with multiple bilateral supra tentorial and right cerebellar regions  c/w acute partially hemorrhagic infarcts suspected 2/2 septic emboli -Continue supportive care and tx underlying causes -Neurology has evaluated -QTc prolonged so haldol not safe  -[id number] 0000000, folic acid [id number], and ammonia 17  Severe thrombocytopenia  -Platelet counts dropped to a nadir of 23 - felt to be due to sepsis - now resolved  Acute Renal Failure/Acute tubular necrosis -due to bacteremia/immune complex or gentamicin -baseline renal fnx: 18 / 0.93 - progressive failure since admit - Nephrology following -Transferred to [name] 09/15/2013 for hemodialysis -Hemodialysis started 09/17/2013 -Renal US WNL   Abdominal distention/diarrhea -distention improved, but diarrhea continues - flexiseal as pt has been "playing in his stool" - diarrhea likely due to abx - C diff negative 2/16 and 2/17   Hypokalemia  - carefully replete prn  NSTEMI (Type II) -peak TNI 2.26 - no plans for cath at this point - has stable ST elevation r/t LBBB  Acute Systolic HF and Chronic grade 1 Diastolic CHF  -ECHO Oct 123456 with EF 60-65% with severe LVH and no RWMA -ECHO Sep 09, 2013 EF 45-50% with septal and apical hypokinesis  History of thyroid cancer, post thyroidectomy hypothyroidism  -TSH elevated at 10.174 - Synthroid increased - repeat labs in 4-6 weeks  Hypertension  -Continue when necessary IV metoprolol - Holding lisinopril due to acute renal failure  Hyperglycemia  -Hemoglobin A1c = 5.7 - follow trend   History of depression  -hold Zoloft secondary to acute encephalopathy.   Hyponatremia  -due to volume overload -corrected after initiation of hemodialysis  DVT prophylaxis: SCDs >> SQ heparin Code Status: Full Family Communication: No family at bedside Disposition:  SDU  Consultants: Cardiology PCCM  Nephrology Neurology Infectious disease Hematology  Antibiotics: Nafcillin 2/10>> Rifampin 2/15 >> Vancomycin 2/10>>2/11 Gentamicin 2/10>> 2/13   Objective: Blood pressure  119/78, pulse 62, temperature 98.7 F (37.1 C), temperature source Oral, resp. rate 33, height 6\' 1"  (1.854 m), weight 227 lb 4.7 oz (103.1 kg), SpO2 96.00%.  Intake/Output Summary (Last 24 hours) at 09/23/13 1253 Last data filed at 09/23/13 0125  Gross per 24 hour  Intake    710 ml  Output   4001 ml  Net  -3291 ml   Exam: General: No acute respiratory distress - alert but confused  Lungs: Clear to auscultation bilaterally without wheezes or crackles, RA Cardiovascular: RRR w/ 2/6 holosystolic M w/o gallup or rub  Abdomen: Nontender, nondistended, soft, bowel sounds positive, no rebound, no ascites, no appreciable mass Musculoskeletal: No significant cyanosis, clubbing of bilateral lower extremities - 1+ B LE edema  Neurological: Awake but not oriented, still seems to have some left side neglect   Scheduled Meds:  Scheduled Meds: . antiseptic oral rinse  15 mL Mouth Rinse q12n4p  . chlorhexidine  15 mL Mouth Rinse BID  . feeding supplement (RESOURCE BREEZE)  1 Container Oral TID WC  . heparin subcutaneous  5,000 Units Subcutaneous 3 times per day  . insulin aspart  0-9 Units Subcutaneous TID WC  . levothyroxine  200 mcg Oral QAC breakfast  . nafcillin IV  2 g Intravenous Q4H  . pantoprazole  40 mg Oral Q1200  . potassium chloride  20 mEq Oral Daily  . rifampin (RIFADIN) IVPB  300 mg Intravenous 3 times per day  . sevelamer carbonate  800 mg Oral TID WC  . sodium chloride  3 mL Intravenous Q12H   Data Reviewed: Basic Metabolic Panel:  Recent Labs Lab 09/17/13 0524 09/18/13 0330 09/19/13 0455 09/20/13 0555 09/22/13 2010 09/23/13 0339  NA 131* 135* 137 133* 134* 138  K 3.7 4.0 3.6* 3.2* 3.0* 3.5*  CL 92* 93* 96 93* 93* 98  CO2 15* 20 22 19 19 23   GLUCOSE 140* 132* 118* 110* 132* 112*  BUN 90* 72* 64* 71* 55* 31*  CREATININE 8.89* 7.87* 7.79* 9.00* 9.36* 6.42*  CALCIUM 7.5* 7.1* 7.1* 7.1* 7.5* 7.6*  MG 2.8*  --  2.2  --   --   --   PHOS 7.4*  --  6.7*  --  8.2*  --       Liver Function Tests:  Recent Labs Lab 09/17/13 0524 09/18/13 0330 09/19/13 0455 09/20/13 0555 09/22/13 2010  AST 35 33 24 24  --   ALT 29 29 22 19   --   ALKPHOS 91 73 59 52  --   BILITOT 1.7* 1.7* 1.7* 1.1  --   PROT 6.8 6.1 5.6* 5.3*  --   ALBUMIN 1.6* 1.4* 1.4* 1.3* 1.2*   CBC:  Recent Labs Lab 09/17/13 0524 09/17/13 1447 09/18/13 0330 09/19/13 0455 09/20/13 0555 09/23/13 0339  WBC 26.2* 24.7* 23.9* 18.4* 15.7* 15.3*  NEUTROABS 24.1* 23.0* 22.0* 16.9*  --   --   HGB 14.8 12.3* 12.7* 10.3* 9.4* 9.0*  HCT 40.9 33.6* 34.5* 28.4* 26.5* 24.8*  MCV 82.8 81.4 80.8 81.8 81.0 80.8  PLT 165 145* 205 214 244 275   Cardiac Enzymes:  Recent Labs Lab 09/17/13 1231 09/17/13 1830  TROPONINI 0.38* 0.36*   CBG:  Recent Labs Lab 09/22/13 0810 09/22/13 1228 09/22/13 1641 09/22/13 2249 09/23/13 0829  GLUCAP 110* 140* 146* 97 97    Recent Results (from  the past 240 hour(s))  CLOSTRIDIUM DIFFICILE BY PCR     Status: None   Collection Time    09/15/13  2:00 AM      Result Value Ref Range Status   C difficile by pcr NEGATIVE  NEGATIVE Final   Comment: Performed at [facility]  MRSA PCR SCREENING     Status: None   Collection Time    09/15/13  5:31 PM      Result Value Ref Range Status   MRSA by PCR NEGATIVE  NEGATIVE Final   Comment:            The GeneXpert MRSA Assay (FDA     approved for NASAL specimens     only), is one component of a     comprehensive MRSA colonization     surveillance program. It is not     intended to diagnose MRSA     infection nor to guide or     monitor treatment for     MRSA infections.  CLOSTRIDIUM DIFFICILE BY PCR     Status: None   Collection Time    09/16/13 10:53 AM      Result Value Ref Range Status   C difficile by pcr NEGATIVE  NEGATIVE Final     Studies:  Recent x-ray studies have been reviewed in detail by the Attending Physician  Time spent : 35 mins  Erin Hearing, ANP [facility]  Consults/Admissions - Flow Manager - [phone] Office  [phone] Pager [phone]  On-Call/Text Page:      [email or web address]      password [facility]  09/23/2013, 12:53 PM   LOS: 15 days     I have examined the patient, reviewed the chart and modified the above note which I agree with.   Makaela Cando,MD [id number] 09/23/2013, 5:27 PM

## 2013-09-23 NOTE — Progress Notes (Signed)
[facility] Progress Note   Subjective: no UOP yesterday, up in chair today  Filed Vitals:   09/23/13 0413 09/23/13 0428 09/23/13 0821 09/23/13 1227  BP:  145/88 126/92 119/78  Pulse:    62  Temp: 99.2 F (37.3 C)  98.8 F (37.1 C) 98.7 F (37.1 C)  TempSrc: Oral  Oral Oral  Resp:  17 23 33  Height:      Weight: 103.1 kg (227 lb 4.7 oz)     SpO2:  96% 98% 96%  Exam Awake and alert, minimal verbal responses No jvd Clear chest bilat RRR 2/6 SEM no gallop Abd soft, nt, nd Ext 1+ edema bilat LE's Neuro is nf, Ox2  UA 09/08/13 > 100 prot, 7-10 wbc, 21-50 rbc, +gran casts, ++bact UA 09/17/13  > same   Assessment: 1 Acute kidney injury- oliguric ATN from gent vs immune-complex disease from infection; 1st HD 2/18, cont MWF dialysis, no signs of recovery yet 2 Staph aureus bacteremia / embolic strokes / tissue AVR 2011- suspected endocarditis, on IV amp and rifampin 3 AMS- gradual improvement 4 HTN/ vol excess- cont to lower vol w HD 5 Hyperphosphatemia- renagel ac 6 Debility- L hemiparesis from #2 above  Plan- HD Wed, UF 3-4 kg as tol, increase time to 4.5 hrs on HD MWF    Kelly Splinter MD  pager [phone]    cell [phone]  09/23/2013, 2:06 PM     Recent Labs Lab 09/17/13 0524  09/19/13 0455 09/20/13 0555 09/22/13 2010 09/23/13 0339  NA 131*  < > 137 133* 134* 138  K 3.7  < > 3.6* 3.2* 3.0* 3.5*  CL 92*  < > 96 93* 93* 98  CO2 15*  < > 22 19 19 23   GLUCOSE 140*  < > 118* 110* 132* 112*  BUN 90*  < > 64* 71* 55* 31*  CREATININE 8.89*  < > 7.79* 9.00* 9.36* 6.42*  CALCIUM 7.5*  < > 7.1* 7.1* 7.5* 7.6*  PHOS 7.4*  --  6.7*  --  8.2*  --   < > = values in this interval not displayed.  Recent Labs Lab 09/18/13 0330 09/19/13 0455 09/20/13 0555 09/22/13 2010  AST 33 24 24  --   ALT 29 22 19   --   ALKPHOS 73 59 52  --   BILITOT 1.7* 1.7* 1.1  --   PROT 6.1 5.6* 5.3*  --   ALBUMIN 1.4* 1.4* 1.3* 1.2*    Recent Labs Lab 09/17/13 1447  09/18/13 0330 09/19/13 0455 09/20/13 0555 09/23/13 0339  WBC 24.7* 23.9* 18.4* 15.7* 15.3*  NEUTROABS 23.0* 22.0* 16.9*  --   --   HGB 12.3* 12.7* 10.3* 9.4* 9.0*  HCT 33.6* 34.5* 28.4* 26.5* 24.8*  MCV 81.4 80.8 81.8 81.0 80.8  PLT 145* 205 214 244 275   . antiseptic oral rinse  15 mL Mouth Rinse q12n4p  . chlorhexidine  15 mL Mouth Rinse BID  . feeding supplement (RESOURCE BREEZE)  1 Container Oral TID WC  . heparin subcutaneous  5,000 Units Subcutaneous 3 times per day  . insulin aspart  0-9 Units Subcutaneous TID WC  . levothyroxine  200 mcg Oral QAC breakfast  . nafcillin IV  2 g Intravenous Q4H  . pantoprazole  40 mg Oral Q1200  . potassium chloride  20 mEq Oral Daily  . rifampin (RIFADIN) IVPB  300 mg Intravenous 3 times per day  . sevelamer carbonate  800 mg Oral TID WC  .  sodium chloride  3 mL Intravenous Q12H     acetaminophen, acetaminophen, hydrOXYzine, liver oil-zinc oxide, metoprolol, morphine injection, ondansetron (ZOFRAN) IV, zolpidem

## 2013-09-23 NOTE — Progress Notes (Signed)
NUTRITION FOLLOW UP  Intervention:   1.  General healthful diet; encourage intake of foods and beverages as able.  Encouraged pt to complete at least 50% of meals with current supplement intake.  RD to follow and assess for nutritional adequacy.  2.  Supplements; continue Resource Breeze TID with meals  Nutrition Dx:   Inadequate oral intake related to inability to eat as evidenced by NPO; ongoing, diet advanced but, PO intake remains inadequate  Goal:   Pt to meet >/= 90% of their estimated nutrition needs; unmet  Monitor:   Weight, Labs, Intake  Assessment:   58 year old male with s/p Bentall with bioprosthetic AV in august 2011. 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 Admitted 09/08/2013 with encephalopathy and septic picture with imaging showing septic embolli of brain and cultures revelaing MSSA bacteremia. Concern is for MSSA endocarditis but TEE unable to be done due to encephalopathy, and thrombocytopenia. On 09/13/13 develping renal failure (d2 of gent stopped) and worsening encephalopathy.  Pt made NPO starting 2/10. Pt's diet was advanced to Heart Healthy 2/16. Now Renal diet since 2/20.  PO up to 75% at times.  Pt anuric.  On HD.  Confusion has somewhat improved since starting HD.  Likely a contributing barrier to his intake although pt was appropriate with RD today and able to communicate is needs.  Daughter at bedside states pt is being picky with meats but otherwise eating well.  States family, including wife, is familiar with Renal diet and pt follows at home.   Labs: high BUN, high creatinine, low calcium, low albumin, very low GFR, high phosphorus, and elevated magnesium.   Height: Ht Readings from Last 1 Encounters:  09/16/13 6\' 1"  (1.854 m)    Weight Status:   Wt Readings from Last 1 Encounters:  09/23/13 227 lb 4.7 oz (103.1 kg)    Re-estimated needs:  Kcal: 2200-2400  Protein: 80-90 g  Fluid:  2.2-2.4L/day  Skin: +1 RUE, LUE, RLE, and LLE edema; intact  Diet Order: Renal   Intake/Output Summary (Last 24 hours) at 09/23/13 1104 Last data filed at 09/23/13 0125  Gross per 24 hour  Intake    710 ml  Output   4001 ml  Net  -3291 ml    Last BM: 2/23  Labs:   Recent Labs Lab 09/17/13 0524  09/19/13 0455 09/20/13 0555 09/22/13 2010 09/23/13 0339  NA 131*  < > 137 133* 134* 138  K 3.7  < > 3.6* 3.2* 3.0* 3.5*  CL 92*  < > 96 93* 93* 98  CO2 15*  < > 22 19 19 23   BUN 90*  < > 64* 71* 55* 31*  CREATININE 8.89*  < > 7.79* 9.00* 9.36* 6.42*  CALCIUM 7.5*  < > 7.1* 7.1* 7.5* 7.6*  MG 2.8*  --  2.2  --   --   --   PHOS 7.4*  --  6.7*  --  8.2*  --   GLUCOSE 140*  < > 118* 110* 132* 112*  < > = values in this interval not displayed.  CBG (last 3)   Recent Labs  09/22/13 1641 09/22/13 2249 09/23/13 0829  GLUCAP 146* 97 97    Scheduled Meds: . antiseptic oral rinse  15 mL Mouth Rinse q12n4p  . chlorhexidine  15 mL Mouth Rinse BID  . feeding supplement (RESOURCE BREEZE)  1 Container Oral TID WC  . heparin subcutaneous  5,000 Units Subcutaneous 3 times per day  . insulin aspart  0-9 Units Subcutaneous TID WC  . levothyroxine  200 mcg Oral QAC breakfast  . nafcillin IV  2 g Intravenous Q4H  . pantoprazole  40 mg Oral Q1200  . potassium chloride  20 mEq Oral Daily  . rifampin (RIFADIN) IVPB  300 mg Intravenous 3 times per day  . sevelamer carbonate  800 mg Oral TID WC  . sodium chloride  3 mL Intravenous Q12H    Continuous Infusions:    Brynda Greathouse, MS RD LDN Clinical Inpatient Dietitian Pager: [phone] Weekend/After hours pager: [phone]

## 2013-09-23 NOTE — Progress Notes (Signed)
Patient ID: Bradley [name], male   DOB: 1956/01/17, 58 y.o.   MRN: [id number]         [facility] for Infectious Disease    Date of Admission:  09/08/2013    Total days of antibiotics 15         Principal Problem:   Sepsis Active Problems:   HYPOTHYROIDISM- TSH 7.5   Aortic valve replaced- Bentall propceedure 2011   Hypertension   Depression   LBBB (left bundle branch block)   Major depressive disorder, recurrent, severe with psychotic features- hospitalized in Jan 2013   Generalized anxiety disorder   Altered mental state   Encephalitis   Septic embolism   HCAP (healthcare-associated pneumonia)   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   Acute diastolic heart failure   NSTEMI (non-ST elevated myocardial infarction)   Agitation   Acute respiratory alkalosis   Acute renal failure   Encephalopathy acute   Staphylococcus aureus bacteremia with sepsis   ST elevation (STEMI) myocardial infarction   . antiseptic oral rinse  15 mL Mouth Rinse q12n4p  . chlorhexidine  15 mL Mouth Rinse BID  . feeding supplement (RESOURCE BREEZE)  1 Container Oral TID WC  . heparin subcutaneous  5,000 Units Subcutaneous 3 times per day  . insulin aspart  0-9 Units Subcutaneous TID WC  . levothyroxine  200 mcg Oral QAC breakfast  . nafcillin IV  2 g Intravenous Q4H  . pantoprazole  40 mg Oral Q1200  . potassium chloride  20 mEq Oral Daily  . rifampin (RIFADIN) IVPB  300 mg Intravenous 3 times per day  . sevelamer carbonate  1,600 mg Oral TID WC  . sodium chloride  3 mL Intravenous Q12H   Objective: Temp:  [98.4 F (36.9 C)-99.2 F (37.3 C)] 98.7 F (37.1 C) (02/24 1227) Pulse Rate:  [62-109] 62 (02/24 1227) Resp:  [17-33] 33 (02/24 1227) BP: (117-157)/(63-97) 119/78 mmHg (02/24 1227) SpO2:  [95 %-99 %] 96 % (02/24 1227) Weight:  [103 kg (227 lb 1.2 oz)-108 kg (238 lb 1.6 oz)] 103.1 kg (227 lb 4.7 oz) (02/24 0413)  General: He is alert and confused. He cannot tell me where he used why he is here.  He is sitting up in a chair. His daughter is present. She states that his appetite appears to have improved. Skin: No rash Lungs: Clear Cor: Tachycardic but regular [id number] 2/6 systolic murmur heard best at the right sternal border with frequent premature beats Abdomen: Nontender Neuro: He is moving all extremities   Lab Results Lab Results  Component Value Date   WBC 15.3* 09/23/2013   HGB 9.0* 09/23/2013   HCT 24.8* 09/23/2013   MCV 80.8 09/23/2013   PLT 275 09/23/2013    Lab Results  Component Value Date   CREATININE 6.42* 09/23/2013   BUN 31* 09/23/2013   NA 138 09/23/2013   K 3.5* 09/23/2013   CL 98 09/23/2013   CO2 23 09/23/2013    Lab Results  Component Value Date   ALT 19 09/20/2013   AST 24 09/20/2013   ALKPHOS 52 09/20/2013   BILITOT 1.1 09/20/2013      Microbiology: Recent Results (from the past 240 hour(s))  CLOSTRIDIUM DIFFICILE BY PCR     Status: None   Collection Time    09/15/13  2:00 AM      Result Value Ref Range Status   C difficile by pcr NEGATIVE  NEGATIVE Final   Comment: Performed at [facility]  PCR SCREENING     Status: None   Collection Time    09/15/13  5:31 PM      Result Value Ref Range Status   MRSA by PCR NEGATIVE  NEGATIVE Final   Comment:            The GeneXpert MRSA Assay (FDA     approved for NASAL specimens     only), is one component of a     comprehensive MRSA colonization     surveillance program. It is not     intended to diagnose MRSA     infection nor to guide or     monitor treatment for     MRSA infections.  CLOSTRIDIUM DIFFICILE BY PCR     Status: None   Collection Time    09/16/13 10:53 AM      Result Value Ref Range Status   C difficile by pcr NEGATIVE  NEGATIVE Final    Assessment: He is making some very slow progress on therapy for MSSA bacteremia and possible endocarditis complicated by recent strokes and acute renal failure.  Plan: 1. Continue nafcillin and rifampin  Michel Bickers, MD [facility] [place] [phone] pager   [phone] cell 09/23/2013, 3:59 PM

## 2013-09-23 NOTE — Progress Notes (Signed)
Rehab admissions - I met with pt and his daughter and explained the possibility of inpatient rehab. His daughter shares that either she or pt's wife will be able to assist pt 24-7 at home and pt/family are interested in pursuing inpatient rehab. Informational brochures were given and questions answered.  Will follow pt status and think that pt is a good inpatient rehab candidate when medically ready. Please call me with any questions. Thanks.  Nanetta Batty, PT Rehabilitation Admissions Coordinator [phone]

## 2013-09-23 NOTE — Progress Notes (Addendum)
Subjective: Pt remains confused; AAO x 1, very sleepy today. His mental status waxes and wanes but worse today than yesterday.   Marland Kitchen antiseptic oral rinse  15 mL Mouth Rinse q12n4p  . chlorhexidine  15 mL Mouth Rinse BID  . feeding supplement (RESOURCE BREEZE)  1 Container Oral TID WC  . heparin subcutaneous  5,000 Units Subcutaneous 3 times per day  . insulin aspart  0-9 Units Subcutaneous TID WC  . levothyroxine  200 mcg Oral QAC breakfast  . nafcillin IV  2 g Intravenous Q4H  . pantoprazole  40 mg Oral Q1200  . potassium chloride  20 mEq Oral Daily  . rifampin (RIFADIN) IVPB  300 mg Intravenous 3 times per day  . sevelamer carbonate  1,600 mg Oral TID WC  . sodium chloride  3 mL Intravenous Q12H    Objective: Vital signs in last 24 hours: Filed Vitals:   09/23/13 0413 09/23/13 0428 09/23/13 0821 09/23/13 1227  BP:  145/88 126/92 119/78  Pulse:    62  Temp: 99.2 F (37.3 C)  98.8 F (37.1 C) 98.7 F (37.1 C)  TempSrc: Oral  Oral Oral  Resp:  17 23 33  Height:      Weight: 227 lb 4.7 oz (103.1 kg)     SpO2:  96% 98% 96%   Weight change: 11 lb 14.5 oz (5.4 kg)  Intake/Output Summary (Last 24 hours) at 09/23/13 1415 Last data filed at 09/23/13 0125  Gross per 24 hour  Intake    200 ml  Output   4001 ml  Net  -3801 ml   Vitals reviewed. General: resting in bed, NAD HEENT: normal Cardiac: slightly tachycardic, 2/6 systolic murmur, no DM Pulm: clear to auscultation bilaterally anteriorly  Abd: soft, nontender, mildly distended, BS present, obese Ext: mild dependent pedal edema Neuro: decreased strength LUE and LLE  Lab Results: Basic Metabolic Panel:  Recent Labs Lab 09/17/13 0524  09/19/13 0455  09/22/13 2010 09/23/13 0339  NA 131*  < > 137  < > 134* 138  K 3.7  < > 3.6*  < > 3.0* 3.5*  CL 92*  < > 96  < > 93* 98  CO2 15*  < > 22  < > 19 23  GLUCOSE 140*  < > 118*  < > 132* 112*  BUN 90*  < > 64*  < > 55* 31*  CREATININE 8.89*  < > 7.79*  < > 9.36*  6.42*  CALCIUM 7.5*  < > 7.1*  < > 7.5* 7.6*  MG 2.8*  --  2.2  --   --   --   PHOS 7.4*  --  6.7*  --  8.2*  --   < > = values in this interval not displayed. Liver Function Tests:  Recent Labs Lab 09/19/13 0455 09/20/13 0555 09/22/13 2010  AST 24 24  --   ALT 22 19  --   ALKPHOS 59 52  --   BILITOT 1.7* 1.1  --   PROT 5.6* 5.3*  --   ALBUMIN 1.4* 1.3* 1.2*   CBC:  Recent Labs Lab 09/18/13 0330 09/19/13 0455 09/20/13 0555 09/23/13 0339  WBC 23.9* 18.4* 15.7* 15.3*  NEUTROABS 22.0* 16.9*  --   --   HGB 12.7* 10.3* 9.4* 9.0*  HCT 34.5* 28.4* 26.5* 24.8*  MCV 80.8 81.8 81.0 80.8  PLT 205 214 244 275   Cardiac Enzymes:  Recent Labs Lab 09/17/13 1231 09/17/13 1830  TROPONINI  0.38* 0.36*    Recent Labs Lab 09/20/13 0555 09/21/13 0345 09/22/13 0300 09/23/13 0339  LABPROT 16.7* 17.8* 16.6* 16.3*  INR 1.39 1.51* 1.38 1.34   09/09/13 echo  Left ventricle: septal and apical hypokinesis The cavity size was mildly dilated. Wall thickness was increased in a pattern of moderate LVH. Systolic function was mildly reduced. The estimated ejection fraction was in the range of 45% to 50%. ------------------------------------------------------------ Aortic valve: Apparent tissue AVR. No abscess or vegetation seen. No AR or perivalvular leak and normal systolic gradients Suggest TEE if clinical suspicion for SBE high Doppler: Peak velocity ratio of LVOT to aortic valve: 0.32. Mean velocity ratio of LVOT to aortic valve: 0.34. Mean gradient: 65mm Hg (S). Peak gradient: 33mm Hg (S). ------------------------------------------------------------ Mitral valve: Mildly thickened leaflets . Doppler: Mild regurgitation. ------------------------------------------------------------ Left atrium: The atrium was mildly dilated. ----------------------------------------------------------- Atrial septum: Poorly  visualized. ------------------------------------------------------------ Right ventricle: The cavity size was normal. Wall thickness was normal. Systolic function was normal. ------------------------------------------------------------ Pulmonic valve: Poorly visualized. The valve appears to be grossly normal. ------------------------------------------------------------ Tricuspid valve: Poorly visualized. Doppler: Mild regurgitation. ------------------------------------------------------------ Right atrium: The atrium was normal in size. ------------------------------------------------------------ Pericardium: The pericardium was normal in appearance.   Assessment/Plan:  58 y.o male with complicated medication history. PMH pseudoanerysm>aortic stenosis s/p bioprosthetic valve replacement Deneen Harts) 2011 and pediatric aortic valvulotomy age 48, heart cath 02/2010 normal, h/o LBBB, severe LVH (noted on EKG), h/o grade 1 diastolic dysfunction, hypothyroidism s/p thyroidectomy for thyroid cancer, HTN, anxiety/depression. This admission since 2/9 has been complicated with multiple medical issues (below). He was transferred to [facility] from [facility] on 2/16 in case he needed HD for worsening renal function. He was transferred from SDU to cardiac ICU 2/18 due to EKG changes, diaphoresis and sob (now resolved).   1. MSSA bacteremia with concern for infective endocarditis s/p AVR  -+cultures 09/08/13 -ID rec tx x 6 weeks  -Now pt only on Nafcillin, Rifampin  -Plan TEE when mental status and confusion improves, hopefully this week. Given multiple medical issues (recent CVA, renal failure, confusion), not clear he is a surgical candidate at present Repeat ECG in AM for PR interval.  2. NSTEMI (non-ST elevated myocardial infarction) type 2  -likely 2/2 demand ischemia, peak troponin I 2.26 on 09/10/13  3. Septic emboli/hemmorhage with encephalitis, minimally improved mental status, waxing and waning  -noted on CT  head  -Neuro, ID following  -at some point this admission associated with left hemiparesis.   4. Acute vs chronic diastolic heart failure  -diuresis controlled by HD, currently mildly fluid overloaded. -echo 2/10 with EF 45-50% h/o grade 1 diastolic dysfunction noted 04/2013   5. Acute renal failure  -Could be ATN 2/2 bacteremia/immune complex thought less likely d/t medications. Gentamycin stopped.  -Nephro consulted and following  -renal US wnl, renal artery duplex pending   6. Hypothyroidism - s/p thyroid cancer and thyroidectomy   Ena Dawley, HMD 09/23/2013 2:15 PM

## 2013-09-23 NOTE — Progress Notes (Signed)
Speech Language Pathology Treatment: Cognitive-Linquistic  Patient Details Name: WASYL NORDER MRN: [id number] DOB: [birth year] Today's Date: 09/23/2013 Time: [id number] SLP Time Calculation (min): 18 min  Assessment / Plan / Recommendation Clinical Impression  Pt seen for f/u cognitive treatment. Pt continues to present with significant impairments with focused and sustained attention, resulting in Max-Total A for one-step commands. Pt exhibited difficulty shifting from one task to the next, perseverating on the previous command. He required Max-Total A for orientation to person, place, and date. Continue to recommend CIR.   HPI HPI: 58 year old male with history of aortic stenosis, status post pediatric aortic valvulotomy at age 62 followed by tissue AVR (Bentall procedure) 02/2000 with cardiac cath 02/2010 with normal coronaries, chronic LBBB, thyroid cancer post thyroidectomy >> hypothyroidism, HTN, and depression, who had been generally unwell for 2-3 weeks. He was seen in the ED on 09/06/13 and assessed as influenza related vomiting, diarrhea, abdominal pain, body aches, fever and mild confusion and was ultimately discharged on Tamiflu. Patient worsened at home with progressive confusion/disorientation and was admitted with sepsis, septic brain emboli and rule out infective endocarditis. MRI findings: MRI Brain (2/10) with multiple bilateral supra tentorial and right cerebellar regions c/w acute partially hemorrhagic infarcts suspected 2/2 septic emboli   Pertinent Vitals N/A  SLP Plan  Continue with current plan of care    Recommendations      General recommendations: Rehab consult Oral Care Recommendations: Oral care BID Follow up Recommendations: Inpatient Rehab Plan: Continue with current plan of care    GO     Germain Osgood, M.A. CCC-SLP [phone]  Germain Osgood 09/23/2013, 3:31 PM

## 2013-09-24 DIAGNOSIS — F29 Unspecified psychosis not due to a substance or known physiological condition: Secondary | ICD-10-CM

## 2013-09-24 LAB — COMPREHENSIVE METABOLIC PANEL
ALT: 19 U/L (ref 0–53)
AST: 23 U/L (ref 0–37)
Albumin: 1.3 g/dL — ABNORMAL LOW (ref 3.5–5.2)
Alkaline Phosphatase: 54 U/L (ref 39–117)
BUN: 41 mg/dL — ABNORMAL HIGH (ref 6–23)
CO2: 20 mEq/L (ref 19–32)
Calcium: 7.7 mg/dL — ABNORMAL LOW (ref 8.4–10.5)
Chloride: 93 mEq/L — ABNORMAL LOW (ref 96–112)
Creatinine, Ser: 7.51 mg/dL — ABNORMAL HIGH (ref 0.50–1.35)
GFR calc Af Amer: 8 mL/min — ABNORMAL LOW (ref >90)
GFR calc non Af Amer: 7 mL/min — ABNORMAL LOW (ref >90)
Glucose, Bld: 105 mg/dL — ABNORMAL HIGH (ref 70–99)
Potassium: 3.8 mEq/L (ref 3.7–5.3)
Sodium: 135 mEq/L — ABNORMAL LOW (ref 137–147)
Total Bilirubin: 0.9 mg/dL (ref 0.3–1.2)
Total Protein: 5.9 g/dL — ABNORMAL LOW (ref 6.0–8.3)

## 2013-09-24 LAB — GLUCOSE, CAPILLARY
Glucose-Capillary: 91 mg/dL (ref 70–99)
Glucose-Capillary: 95 mg/dL (ref 70–99)
Glucose-Capillary: 131 mg/dL — ABNORMAL HIGH (ref 70–99)
Glucose-Capillary: 142 mg/dL — ABNORMAL HIGH (ref 70–99)

## 2013-09-24 LAB — CBC
HCT: 25.5 % — ABNORMAL LOW (ref 39.0–52.0)
Hemoglobin: 9 g/dL — ABNORMAL LOW (ref 13.0–17.0)
MCH: 28.9 pg (ref 26.0–34.0)
MCHC: 35.3 g/dL (ref 30.0–36.0)
MCV: 82 fL (ref 78.0–100.0)
Platelets: 255 10*3/uL (ref 150–400)
RBC: 3.11 MIL/uL — ABNORMAL LOW (ref 4.22–5.81)
RDW: 17.2 % — ABNORMAL HIGH (ref 11.5–15.5)
WBC: 12.1 10*3/uL — ABNORMAL HIGH (ref 4.0–10.5)

## 2013-09-24 LAB — PROTIME-INR
INR: 1.55 — ABNORMAL HIGH (ref 0.00–1.49)
Prothrombin Time: 18.2 seconds — ABNORMAL HIGH (ref 11.6–15.2)

## 2013-09-24 MED ORDER — RIFAMPIN 300 MG PO CAPS
600.0000 mg | ORAL_CAPSULE | Freq: Every day | ORAL | Status: DC
  Administered 2013-09-25 – 2013-10-02 (×8): 600 mg via ORAL
  Administered 2013-10-03: 300 mg via ORAL
  Administered 2013-10-04: 600 mg via ORAL
  Administered 2013-10-05: 300 mg via ORAL
  Administered 2013-10-06 – 2013-10-10 (×5): 600 mg via ORAL
  Filled 2013-09-24 (×17): qty 2

## 2013-09-24 MED ORDER — SODIUM CHLORIDE 0.9 % IV SOLN: 100.0000 mL | INTRAVENOUS | Status: DC | PRN

## 2013-09-24 MED ORDER — ASPIRIN EC 81 MG PO TBEC: 81.0000 mg | DELAYED_RELEASE_TABLET | Freq: Every day | ORAL | Status: DC

## 2013-09-24 MED ORDER — HEPARIN SODIUM (PORCINE) 1000 UNIT/ML DIALYSIS
1000.0000 [IU] | INTRAMUSCULAR | Status: DC | PRN
  Filled 2013-09-24: qty 1

## 2013-09-24 MED ORDER — LIDOCAINE HCL (PF) 1 % IJ SOLN: 5.0000 mL | INTRAMUSCULAR | Status: DC | PRN

## 2013-09-24 MED ORDER — NEPRO/CARBSTEADY PO LIQD: 237.0000 mL | ORAL | Status: DC | PRN

## 2013-09-24 MED ORDER — LIDOCAINE-PRILOCAINE 2.5-2.5 % EX CREA: 1.0000 "application " | TOPICAL_CREAM | CUTANEOUS | Status: DC | PRN

## 2013-09-24 MED ORDER — ALBUMIN HUMAN 25 % IV SOLN
25.0000 g | Freq: Once | INTRAVENOUS | Status: AC
  Administered 2013-09-24: 25 g via INTRAVENOUS
  Filled 2013-09-24: qty 100

## 2013-09-24 MED ORDER — ASPIRIN EC 81 MG PO TBEC
81.0000 mg | DELAYED_RELEASE_TABLET | Freq: Every day | ORAL | Status: DC
  Administered 2013-09-27 – 2013-10-10 (×14): 81 mg via ORAL
  Filled 2013-09-24 (×15): qty 1

## 2013-09-24 MED ORDER — ALTEPLASE 2 MG IJ SOLR: 2.0000 mg | Freq: Once | INTRAMUSCULAR | Status: AC | PRN

## 2013-09-24 MED ORDER — HEPARIN SODIUM (PORCINE) 1000 UNIT/ML DIALYSIS
2000.0000 [IU] | INTRAMUSCULAR | Status: DC | PRN
  Administered 2013-09-24: 2000 [IU] via INTRAVENOUS_CENTRAL
  Filled 2013-09-24: qty 2

## 2013-09-24 MED ORDER — PENTAFLUOROPROP-TETRAFLUOROETH EX AERO: 1.0000 "application " | INHALATION_SPRAY | CUTANEOUS | Status: DC | PRN

## 2013-09-24 MED ORDER — ALBUMIN HUMAN 25 % IV SOLN
INTRAVENOUS | Status: AC
  Administered 2013-09-24: 25 g via INTRAVENOUS
  Filled 2013-09-24: qty 100

## 2013-09-24 NOTE — H&P (Signed)
Bradley [name] is an 58 y.o. male.   Chief Complaint: pt with endocarditis/septic brain emboli Aortic valve replaced 2011 Weakness and AMS at home- was first dx with flu 2/5- sxs of N/V/weakness/ confusion continued to worsen Admitted 2/7: diagnosed endocarditis BC + staph 2/9; 2/11 neg after 5 days Placed on antibiotic treatment Renal functions worsened -- Emergent temporary dialysis catheter placed 2/22 Now renal function shows no real sign of recovery per Renal MD Request made for perm cath placement  HPI: depression; thyroid ca; HTN; Ao stenosis- valve replaced 2011; CAD/MI  Past Medical History  Diagnosis Date  . Anxiety   . Depression   . History of thyroid cancer   . Hypertension   . Hypothyroidism, postsurgical   . Aortic stenosis     s/p Bentall with bioprosthetic AVR 02/2010; Last echo (9/11): Moderate LVH, EF 45-50%, AVR functioning appropriately, aortic valve mean gradient 21, diastolic dysfunction. Chest MRA (2/13): Mild to moderate dilatation at the sinus of Valsalva at 4.1 cm, mild dilatation ascending aorta distal to the tube graft at 3.9 cm, moderate dilatation of the innominate artery a 2.1 cm;    . Hx of cardiac cath     a. LHC in 02/2010: normal cors  . Hx of echocardiogram 2014    Echo (10/14): Severe LVH, EF 60-65%, normal wall motion, grade 1 diastolic dysfunction, AVR functioning normally, mild aortic stenosis (mean 19), moderately dilated aorta, mild LAE, mild RVE    Past Surgical History  Procedure Laterality Date  . Cardiac catheterization  03/02/2010    NORMAL CORONARY ARTERY  . Sternotomy      REDO  . Transthoracic echocardiogram  03/2010    SHOWED MILD REDUCTION OF LV FUNCTION  . Thyroidectomy    . Rotator cuff repair  2012    Right    Family History  Problem Relation Age of Onset  . Heart disease Father    Social History:  reports that he has never smoked. He has never used smokeless tobacco. He reports that he does not drink alcohol or use  illicit drugs.  Allergies: No Known Allergies  Medications Prior to Admission  Medication Sig Dispense Refill  . acetaminophen (TYLENOL) 500 MG tablet Take 500 mg by mouth every 6 (six) hours as needed for mild pain or fever.      Marland Kitchen aspirin 325 MG tablet Take 325 mg by mouth daily.        Marland Kitchen ibuprofen (ADVIL,MOTRIN) 200 MG tablet Take 200 mg by mouth every 6 (six) hours as needed for fever.      . levothyroxine (SYNTHROID, LEVOTHROID) 175 MCG tablet Take 175 mcg by mouth daily.      Marland Kitchen lisinopril (PRINIVIL,ZESTRIL) 10 MG tablet Take 1 tablet (10 mg total) by mouth daily.  30 tablet  11  . metoprolol (LOPRESSOR) 50 MG tablet Take 25 mg by mouth 2 (two) times daily.      . multivitamin (THERAGRAN) per tablet Take 1 tablet by mouth daily.        Marland Kitchen oseltamivir (TAMIFLU) 75 MG capsule Take 1 capsule (75 mg total) by mouth every 12 (twelve) hours.  10 capsule  0  . sertraline (ZOLOFT) 50 MG tablet Take 50 mg by mouth daily.        Results for orders placed during the hospital encounter of 09/08/13 (from the past 48 hour(s))  GLUCOSE, CAPILLARY     Status: Abnormal   Collection Time    09/22/13  4:41 PM  Result Value Ref Range   Glucose-Capillary 146 (*) 70 - 99 mg/dL  RENAL FUNCTION PANEL     Status: Abnormal   Collection Time    09/22/13  8:10 PM      Result Value Ref Range   Sodium 134 (*) 137 - 147 mEq/L   Potassium 3.0 (*) 3.7 - 5.3 mEq/L   Chloride 93 (*) 96 - 112 mEq/L   CO2 19  19 - 32 mEq/L   Glucose, Bld 132 (*) 70 - 99 mg/dL   BUN 55 (*) 6 - 23 mg/dL   Creatinine, Ser 9.36 (*) 0.50 - 1.35 mg/dL   Calcium 7.5 (*) 8.4 - 10.5 mg/dL   Phosphorus 8.2 (*) 2.3 - 4.6 mg/dL   Albumin 1.2 (*) 3.5 - 5.2 g/dL   GFR calc non Af Amer 6 (*) >90 mL/min   GFR calc Af Amer 6 (*) >90 mL/min   Comment: (NOTE)     The eGFR has been calculated using the CKD EPI equation.     This calculation has not been validated in all clinical situations.     eGFR's persistently <90 mL/min signify  possible Chronic Kidney     Disease.  GLUCOSE, CAPILLARY     Status: None   Collection Time    09/22/13 10:49 PM      Result Value Ref Range   Glucose-Capillary 97  70 - 99 mg/dL   Comment 1 Notify RN     Comment 2 Documented in Chart    PROTIME-INR     Status: Abnormal   Collection Time    09/23/13  3:39 AM      Result Value Ref Range   Prothrombin Time 16.3 (*) 11.6 - 15.2 seconds   INR 1.34  0.00 - 1.49  CBC     Status: Abnormal   Collection Time    09/23/13  3:39 AM      Result Value Ref Range   WBC 15.3 (*) 4.0 - 10.5 K/uL   RBC 3.07 (*) 4.22 - 5.81 MIL/uL   Hemoglobin 9.0 (*) 13.0 - 17.0 g/dL   HCT 24.8 (*) 39.0 - 52.0 %   MCV 80.8  78.0 - 100.0 fL   MCH 29.3  26.0 - 34.0 pg   MCHC 36.3 (*) 30.0 - 36.0 g/dL   RDW 16.6 (*) 11.5 - 15.5 %   Platelets 275  150 - 400 K/uL  BASIC METABOLIC PANEL     Status: Abnormal   Collection Time    09/23/13  3:39 AM      Result Value Ref Range   Sodium 138  137 - 147 mEq/L   Potassium 3.5 (*) 3.7 - 5.3 mEq/L   Chloride 98  96 - 112 mEq/L   CO2 23  19 - 32 mEq/L   Glucose, Bld 112 (*) 70 - 99 mg/dL   BUN 31 (*) 6 - 23 mg/dL   Comment: DELTA CHECK NOTED   Creatinine, Ser 6.42 (*) 0.50 - 1.35 mg/dL   Calcium 7.6 (*) 8.4 - 10.5 mg/dL   GFR calc non Af Amer 9 (*) >90 mL/min   GFR calc Af Amer 10 (*) >90 mL/min   Comment: (NOTE)     The eGFR has been calculated using the CKD EPI equation.     This calculation has not been validated in all clinical situations.     eGFR's persistently <90 mL/min signify possible Chronic Kidney     Disease.  GLUCOSE, CAPILLARY  Status: None   Collection Time    09/23/13  8:29 AM      Result Value Ref Range   Glucose-Capillary 97  70 - 99 mg/dL  GLUCOSE, CAPILLARY     Status: Abnormal   Collection Time    09/23/13  1:23 PM      Result Value Ref Range   Glucose-Capillary 242 (*) 70 - 99 mg/dL  GLUCOSE, CAPILLARY     Status: Abnormal   Collection Time    09/23/13  4:56 PM      Result Value Ref  Range   Glucose-Capillary 196 (*) 70 - 99 mg/dL  GLUCOSE, CAPILLARY     Status: Abnormal   Collection Time    09/23/13  9:45 PM      Result Value Ref Range   Glucose-Capillary 100 (*) 70 - 99 mg/dL  PROTIME-INR     Status: Abnormal   Collection Time    09/24/13 12:50 AM      Result Value Ref Range   Prothrombin Time 18.2 (*) 11.6 - 15.2 seconds   INR 1.55 (*) 0.00 - 1.49  CBC     Status: Abnormal   Collection Time    09/24/13 12:50 AM      Result Value Ref Range   WBC 12.1 (*) 4.0 - 10.5 K/uL   RBC 3.11 (*) 4.22 - 5.81 MIL/uL   Hemoglobin 9.0 (*) 13.0 - 17.0 g/dL   HCT 00.7 (*) 62.2 - 63.3 %   MCV 82.0  78.0 - 100.0 fL   MCH 28.9  26.0 - 34.0 pg   MCHC 35.3  30.0 - 36.0 g/dL   RDW 35.4 (*) 56.2 - 56.3 %   Platelets 255  150 - 400 K/uL  COMPREHENSIVE METABOLIC PANEL     Status: Abnormal   Collection Time    09/24/13 12:50 AM      Result Value Ref Range   Sodium 135 (*) 137 - 147 mEq/L   Potassium 3.8  3.7 - 5.3 mEq/L   Chloride 93 (*) 96 - 112 mEq/L   CO2 20  19 - 32 mEq/L   Glucose, Bld 105 (*) 70 - 99 mg/dL   BUN 41 (*) 6 - 23 mg/dL   Creatinine, Ser 8.93 (*) 0.50 - 1.35 mg/dL   Calcium 7.7 (*) 8.4 - 10.5 mg/dL   Total Protein 5.9 (*) 6.0 - 8.3 g/dL   Albumin 1.3 (*) 3.5 - 5.2 g/dL   AST 23  0 - 37 U/L   ALT 19  0 - 53 U/L   Alkaline Phosphatase 54  39 - 117 U/L   Total Bilirubin 0.9  0.3 - 1.2 mg/dL   GFR calc non Af Amer 7 (*) >90 mL/min   GFR calc Af Amer 8 (*) >90 mL/min   Comment: (NOTE)     The eGFR has been calculated using the CKD EPI equation.     This calculation has not been validated in all clinical situations.     eGFR's persistently <90 mL/min signify possible Chronic Kidney     Disease.  GLUCOSE, CAPILLARY     Status: None   Collection Time    09/24/13  7:41 AM      Result Value Ref Range   Glucose-Capillary 91  70 - 99 mg/dL   No results found.  Review of Systems  Constitutional: Positive for weight loss. Negative for fever.  Respiratory:  Positive for shortness of breath.   Cardiovascular: Negative for chest pain.  Gastrointestinal: Negative for nausea, vomiting and abdominal pain.  Musculoskeletal: Positive for myalgias.  Neurological: Positive for weakness.  Psychiatric/Behavioral: Positive for memory loss.    Blood pressure 113/81, pulse 103, temperature 99 F (37.2 C), temperature source Oral, resp. rate 26, height _0  (1.854 m), weight 104.4 kg (230 lb 2.6 oz), SpO2 100.00%. Physical Exam  Constitutional: He appears well-developed.  Cardiovascular: Regular rhythm.   Murmur heard. Respiratory: Effort normal and breath sounds normal. He has no wheezes.  GI: Soft. Bowel sounds are normal. There is no tenderness.  Musculoskeletal: Normal range of motion.  Neurological:  Not able to tell me his name or dob  Skin: Skin is warm and dry.  Psychiatric:  Consented wife in room     Assessment/Plan  Endocarditis/septic brain emboli- antibiotic treatment On Nafcillin now [facility] neg 2/11 x 5 days Renal functioned declined Temporary dialysis catheter placed in IR 2/22 emergently Renal fxn initially improved slightly- now worsened and shows no sign of recovery per Renal MD Scheduled for permanent catheter placement pts wife aware of procedure beenfits and risks and agreeable to proceed Consent signed and in chart Hep inj held 2/26  Jawad Wiacek A 09/24/2013, 12:36 PM

## 2013-09-24 NOTE — Progress Notes (Signed)
Occupational Therapy Treatment Patient Details Name: Bradley [name] MRN: [id number] DOB: 03/12/1956 Today's Date: 09/24/2013 Time: [id number] OT Time Calculation (min): 21 min  OT Assessment / Plan / Recommendation  History of present illness Pt admit with sepsis and r/o endocarditis.  Anoxic brain injury.   OT comments  Pt much less restless than previous OT visit.  Improved ability to communicate his needs (feeling tired post HD).  Able to sit EOB with min guard to min assist.  Bringing drink to mouth with supervision once placed in his hand. Following some one step commands with increased time to respond. Pt will need intense rehab.  Follow Up Recommendations  CIR;Supervision/Assistance - 24 hour    Barriers to Discharge       Equipment Recommendations       Recommendations for Other Services    Frequency Min 2X/week   Progress towards OT Goals Progress towards OT goals: Progressing toward goals  Plan Discharge plan remains appropriate    Precautions / Restrictions Precautions Precautions: Fall Restrictions Weight Bearing Restrictions: No   Pertinent Vitals/Pain HR 114 with activity, indicated L knee pain, ?shoulder pain with movement, repositioned    ADL  Eating/Feeding:  (Pt able to bring Breeze box to his mouth with supervision) Where Assessed - Eating/Feeding: Bed level Transfers/Ambulation Related to ADLs: Pt unable to attempt standing for transfers. ADL Comments: Sat EOB x 10 min with min guard to min assist.    OT Diagnosis:    OT Problem List:   OT Treatment Interventions:     OT Goals(current goals can now be found in the care plan section)    Visit Information  Last OT Received On: 09/24/13 Assistance Needed: +2 PT/OT/SLP Co-Evaluation/Treatment: Yes Reason for Co-Treatment: Necessary to address cognition/behavior during functional activity;For patient/therapist safety OT goals addressed during session: Strengthening/ROM History of Present Illness: Pt  admit with sepsis and r/o endocarditis.  Anoxic brain injury.    Subjective Data      Prior Functioning       Cognition  Cognition Arousal/Alertness: Lethargic Behavior During Therapy: Flat affect Overall Cognitive Status: Impaired/Different from baseline Area of Impairment: Orientation;Attention;Memory;Following commands;Safety/judgement;Awareness;Problem solving;[place] level Orientation Level: Disoriented to;Place;Time;Situation Current Attention Level: Focused Following Commands: Follows one step commands inconsistently;Follows one step commands with increased time Safety/Judgement: Decreased awareness of safety;Decreased awareness of deficits Awareness: Intellectual Problem Solving: Slow processing;Decreased initiation;Difficulty sequencing;Requires verbal cues;Requires tactile cues General Comments: Delayed processing time, sometimes 10-20 seconds to respond.      Mobility  Bed Mobility Overal bed mobility: Needs Assistance;+2 for physical assistance Bed Mobility: Supine to Sit;Sit to Supine Supine to sit: Max assist;+2 for physical assistance Sit to supine: +2 for physical assistance;Max assist General bed mobility comments: Initiated L LE off EOB. Grimaced when righting trunk.  Family reports pt has painful shoulders at baseline. Transfers General transfer comment: Attempted to stand, pt verbalized that he was too tired.  Rubbed L knee as if painful, but did not report pain.    Exercises      Balance Balance Overall balance assessment: Needs assistance Sitting-balance support: Feet supported;Bilateral upper extremity supported Sitting balance-Leahy Scale: Fair  End of Session OT - End of Session Activity Tolerance: Patient limited by fatigue;Patient limited by pain Patient left: in bed;with call bell/phone within reach;with bed alarm set;with family/visitor present Nurse Communication: Mobility status  GO     Malka So 09/24/2013, 2:48  PM [phone]

## 2013-09-24 NOTE — Progress Notes (Signed)
Physical Therapy Treatment Patient Details Name: Bradley [name] MRN: [id number] DOB: 10-Sep-1955 Today's Date: 09/24/2013 Time: 1410-1430 PT Time Calculation (min): 20 min  PT Assessment / Plan / Recommendation  History of Present Illness Pt admit with sepsis and r/o endocarditis.  Anoxic brain injury.   PT Comments   Pt less agitated during session today but very fatigued from HD. Attempted stand 3x without success but pt more participatory with bed mobility and sat EOB x 60mins with improved balance. Will attempt to see again on a non-HD day.    Follow Up Recommendations  CIR;Supervision/Assistance - 24 hour     Does the patient have the potential to tolerate intense rehabilitation     Barriers to Discharge        Equipment Recommendations  Other (comment) (TBA)    Recommendations for Other Services Rehab consult  Frequency Min 3X/week   Progress towards PT Goals Progress towards PT goals: Progressing toward goals  Plan Current plan remains appropriate    Precautions / Restrictions Precautions Precautions: Fall Restrictions Weight Bearing Restrictions: No   Pertinent Vitals/Pain Grimaced when shoulders touched and with attempted WB'ing through LLE    Mobility  Bed Mobility Overal bed mobility: Needs Assistance;+2 for physical assistance Bed Mobility: Supine to Sit;Sit to Supine Supine to sit: Max assist;+2 for physical assistance Sit to supine: +2 for physical assistance;Max assist General bed mobility comments: pt performed 30% of effort, intitiated legs off bed, though grimaced with SL to sit and family reports painful left shoulder (was awaiting surgery). After sitting EOB 10 mins, pt intitiating return to supine with left lean towards mattress. Max A, pt 20% for legs into bed and scooting up in bed. Pt assisted with lowering trunk to bed Transfers Overall transfer level: Needs assistance Equipment used: 2 person hand held assist Transfers: Sit to/from Stand Sit to  Stand: Max assist;+2 physical assistance;From elevated surface General transfer comment: attempted sit to stand 3x and pt verbalized that he would like to get out of bed and would like to try to stand but pt not pushing down through floor with feet and actually lifting left foot off floor and answering "yes" that it hurt (wife reports that legs hurt when pt was admitted).  Ambulation/Gait General Gait Details: NT    Exercises     PT Diagnosis:    PT Problem List:   PT Treatment Interventions:     PT Goals (current goals can now be found in the care plan section) Acute Rehab PT Goals Patient Stated Goal: unable to state as pt confused PT Goal Formulation: With patient Time For Goal Achievement: 10/06/13 Potential to Achieve Goals: Good  Visit Information  Last PT Received On: 09/24/13 Assistance Needed: +2 PT/OT/SLP Co-Evaluation/Treatment: Yes Reason for Co-Treatment: Complexity of the patient's impairments (multi-system involvement);Necessary to address cognition/behavior during functional activity;For patient/therapist safety PT goals addressed during session: Mobility/safety with mobility;Balance OT goals addressed during session: Strengthening/ROM History of Present Illness: Pt admit with sepsis and r/o endocarditis.  Anoxic brain injury.    Subjective Data  Subjective: pt with minimal response but appropriate today, very fatigued from HD Patient Stated Goal: unable to state as pt confused   Cognition  Cognition Arousal/Alertness: Lethargic Behavior During Therapy: Flat affect Overall Cognitive Status: Impaired/Different from baseline Area of Impairment: Orientation;Attention;Memory;Following commands;Safety/judgement;Awareness;Problem solving;Rancho level Orientation Level: Disoriented to;Place;Time;Situation Current Attention Level: Focused Memory: Decreased short-term memory Following Commands: Follows one step commands inconsistently;Follows one step commands with  increased time Safety/Judgement: Decreased awareness of  safety;Decreased awareness of deficits Awareness: Intellectual Problem Solving: Slow processing;Decreased initiation;Difficulty sequencing;Requires verbal cues;Requires tactile cues General Comments: Delayed processing time, sometimes 10-20 seconds to respond.   Rancho Levels of Cognitive Functioning Ranch[place] Amigos Scales of Cognitive Functioning: Confused/agitated    Balance  Balance Overall balance assessment: Needs assistance Sitting-balance support: Feet supported;Bilateral upper extremity supported Sitting balance-Leahy Scale: Fair Sitting balance - Comments: mostly fair but as pt fatigued, became poor.  Postural control: Left lateral lean  End of Session PT - End of Session Equipment Utilized During Treatment: Gait belt Activity Tolerance: Patient limited by fatigue Patient left: in bed;with call bell/phone within reach;with family/visitor present Nurse Communication: Mobility status   GP    Leighton Roach, PT  Acute Rehab Services  [phone]  Leighton Roach 09/24/2013, 4:10 PM

## 2013-09-24 NOTE — Progress Notes (Signed)
[facility] Progress Note   Subjective: No complaints  Filed Vitals:   09/24/13 0932 09/24/13 1000 09/24/13 1030 09/24/13 1100  BP: 119/83 129/82 117/87 127/89  Pulse: 98 102 98 102  Temp:      TempSrc:      Resp:  24 25 22   Height:      Weight:      SpO2:      Exam Awake and alert, making more sense  No jvd Clear chest bilat RRR 2/6 SEM no gallop Abd soft, nt, nd Ext 1+ edema bilat LE's Neuro is nf, Ox2  UA 09/08/13 > 100 prot, 7-10 wbc, 21-50 rbc, +gran casts, ++bact UA 09/17/13  > same   Assessment: 1 AKI- no signs of recovery 2 Prosthetic tissue AVR w presumed endocarditis- on IV nafcillin and rifampin 3 AMS / bilat acute CVA's- L hemi, MS improving slowly 4 HTN/ vol excess- improving 5 Hyperphosphatemia- renagel ac 6 NSTEMI 7 HCAP- improved   Plan- will ask IR to place a tunneled HD cath (2wks since bacteremia and f/u cx's have been negative). Next HD Friday    Kelly Splinter MD  pager [phone]    cell [phone]  09/24/2013, 11:20 AM     Recent Labs Lab 09/19/13 0455  09/22/13 2010 09/23/13 0339 09/24/13 0050  NA 137  < > 134* 138 135*  K 3.6*  < > 3.0* 3.5* 3.8  CL 96  < > 93* 98 93*  CO2 22  < > 19 23 20   GLUCOSE 118*  < > 132* 112* 105*  BUN 64*  < > 55* 31* 41*  CREATININE 7.79*  < > 9.36* 6.42* 7.51*  CALCIUM 7.1*  < > 7.5* 7.6* 7.7*  PHOS 6.7*  --  8.2*  --   --   < > = values in this interval not displayed.  Recent Labs Lab 09/19/13 0455 09/20/13 0555 09/22/13 2010 09/24/13 0050  AST 24 24  --  23  ALT 22 19  --  19  ALKPHOS 59 52  --  54  BILITOT 1.7* 1.1  --  0.9  PROT 5.6* 5.3*  --  5.9*  ALBUMIN 1.4* 1.3* 1.2* 1.3*    Recent Labs Lab 09/17/13 1447 09/18/13 0330 09/19/13 0455 09/20/13 0555 09/23/13 0339 09/24/13 0050  WBC 24.7* 23.9* 18.4* 15.7* 15.3* 12.1*  NEUTROABS 23.0* 22.0* 16.9*  --   --   --   HGB 12.3* 12.7* 10.3* 9.4* 9.0* 9.0*  HCT 33.6* 34.5* 28.4* 26.5* 24.8* 25.5*  MCV 81.4 80.8 81.8  81.0 80.8 82.0  PLT 145* 205 214 244 275 255   . antiseptic oral rinse  15 mL Mouth Rinse q12n4p  . chlorhexidine  15 mL Mouth Rinse BID  . feeding supplement (RESOURCE BREEZE)  1 Container Oral TID WC  . heparin subcutaneous  5,000 Units Subcutaneous 3 times per day  . insulin aspart  0-9 Units Subcutaneous TID WC  . levothyroxine  200 mcg Oral QAC breakfast  . nafcillin IV  2 g Intravenous Q4H  . pantoprazole  40 mg Oral Q1200  . potassium chloride  20 mEq Oral Daily  . rifampin (RIFADIN) IVPB  300 mg Intravenous 3 times per day  . sevelamer carbonate  1,600 mg Oral TID WC  . sodium chloride  3 mL Intravenous Q12H     sodium chloride, sodium chloride, acetaminophen, acetaminophen, alteplase, feeding supplement (NEPRO CARB STEADY), heparin, [START ON 09/25/2013] heparin, hydrOXYzine, lidocaine (PF), lidocaine-prilocaine, liver oil-zinc  oxide, metoprolol, morphine injection, ondansetron (ZOFRAN) IV, pentafluoroprop-tetrafluoroeth, zolpidem

## 2013-09-24 NOTE — Progress Notes (Signed)
Patient ID: Bradley [name], male   DOB: [birth year], 57 y.o.   MRN: [id number]         [facility] for Infectious Disease    Date of Admission:  09/08/2013    Total days of antibiotics 15         Principal Problem:   Sepsis Active Problems:   HYPOTHYROIDISM- TSH 7.5   Aortic valve replaced- Bentall propceedure 2011   Hypertension   Depression   LBBB (left bundle branch block)   Major depressive disorder, recurrent, severe with psychotic features- hospitalized in Jan 2013   Generalized anxiety disorder   Altered mental state   Encephalitis   Septic embolism   HCAP (healthcare-associated pneumonia)   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   Acute diastolic heart failure   NSTEMI (non-ST elevated myocardial infarction)   Agitation   Acute respiratory alkalosis   Acute renal failure   Encephalopathy acute   Staphylococcus aureus bacteremia with sepsis   ST elevation (STEMI) myocardial infarction   . antiseptic oral rinse  15 mL Mouth Rinse q12n4p  . chlorhexidine  15 mL Mouth Rinse BID  . feeding supplement (RESOURCE BREEZE)  1 Container Oral TID WC  . heparin subcutaneous  5,000 Units Subcutaneous 3 times per day  . insulin aspart  0-9 Units Subcutaneous TID WC  . levothyroxine  200 mcg Oral QAC breakfast  . nafcillin IV  2 g Intravenous Q4H  . pantoprazole  40 mg Oral Q1200  . potassium chloride  20 mEq Oral Daily  . rifampin  600 mg Oral Daily  . sevelamer carbonate  1,600 mg Oral TID WC  . sodium chloride  3 mL Intravenous Q12H   Objective: Temp:  [98 F (36.7 C)-101.2 F (38.4 C)] 99 F (37.2 C) (02/25 0814) Pulse Rate:  [90-105] 103 (02/25 1242) Resp:  [11-29] 26 (02/25 1151) BP: (96-146)/(58-92) 120/83 mmHg (02/25 1242) SpO2:  [96 %-100 %] 100 % (02/25 0814) Weight:  [104.3 kg (229 lb 15 oz)-104.4 kg (230 lb 2.6 oz)] 104.4 kg (230 lb 2.6 oz) (02/25 [id number])  He is currently out of his room for hemodialysis.  Lab Results Lab Results  Component Value Date   WBC 12.1*  09/24/2013   HGB 9.0* 09/24/2013   HCT 25.5* 09/24/2013   MCV 82.0 09/24/2013   PLT 255 09/24/2013    Lab Results  Component Value Date   CREATININE 7.51* 09/24/2013   BUN 41* 09/24/2013   NA 135* 09/24/2013   K 3.8 09/24/2013   CL 93* 09/24/2013   CO2 20 09/24/2013    Lab Results  Component Value Date   ALT 19 09/24/2013   AST 23 09/24/2013   ALKPHOS 54 09/24/2013   BILITOT 0.9 09/24/2013      Microbiology: Recent Results (from the past 240 hour(s))  CLOSTRIDIUM DIFFICILE BY PCR     Status: None   Collection Time    09/15/13  2:00 AM      Result Value Ref Range Status   C difficile by pcr NEGATIVE  NEGATIVE Final   Comment: Performed at [facility]  MRSA PCR SCREENING     Status: None   Collection Time    09/15/13  5:31 PM      Result Value Ref Range Status   MRSA by PCR NEGATIVE  NEGATIVE Final   Comment:            The GeneXpert MRSA Assay (FDA     approved for NASAL specimens  only), is one component of a     comprehensive MRSA colonization     surveillance program. It is not     intended to diagnose MRSA     infection nor to guide or     monitor treatment for     MRSA infections.  CLOSTRIDIUM DIFFICILE BY PCR     Status: None   Collection Time    09/16/13 10:53 AM      Result Value Ref Range Status   C difficile by pcr NEGATIVE  NEGATIVE Final    Assessment: His nurse reports that he remains confused. He had fever overnight but otherwise is stable. He had one small liquid stool. His family states that he may a small amount of urine yesterday but this was not recorded.  Plan: 1. Continue nafcillin and rifampin 2. Check blood cultures and stool for C. difficile PCR  Michel Bickers, MD [facility] [place] [phone] pager   [phone] cell 09/24/2013, 12:55 PM

## 2013-09-24 NOTE — Progress Notes (Signed)
Notified MD of temp. 101.2. New orders received. Will continue to monitor and notify MD as needed.

## 2013-09-24 NOTE — Progress Notes (Signed)
OT Cancellation Note  Patient Details Name: Bradley [name] MRN: [id number] DOB: [birth year]   Cancelled Treatment:    Reason Eval/Treat Not Completed: Patient at procedure or test/ unavailable (HD). Will continue to follow.  Malka So 09/24/2013, 8:36 AM

## 2013-09-24 NOTE — Progress Notes (Signed)
09/24/2013 patient transfer from 2h to 6East, at 1700. He is alert to person, and stated he is in the hospital, have some weakness. Patient have scratches on bilateral arms legs, scab on left lower lip and bruise on right arm. Swelling in lower legs. Sacrum pink, but blanches. He was placed on telemetry when arrive on unit. [facility] RN.

## 2013-09-24 NOTE — Progress Notes (Signed)
Bradley [name] - Stepdown / ICU Progress Note  Bradley [name] [id number] DOB: 08/16/1955 DOA: 09/08/2013 PCP: [name], MD  Brief narrative: 58 year old male with history of aortic stenosis, status post pediatric aortic valvulotomy at age 74 followed by tissue AVR (Bentall procedure) 02/2000 with cardiac cath 02/2010 with normal coronaries, chronic LBBB, thyroid cancer post thyroidectomy >> hypothyroidism, HTN, and depression, who had been generally unwell for 2-3 weeks.  He was seen in the ED on 09/06/13 and assessed as influenza related vomiting, diarrhea, abdominal pain, body aches, fever and mild confusion and was ultimately discharged on Tamiflu. Patient worsened at home with progressive confusion/disorientation and was admitted with sepsis, septic brain emboli and rule out infective endocarditis.  SIGNIFICANT EVENTS / STUDIES:  2/09 - admit  2/10 - ID, Heme and Cards consults. TTE EF 45%. No clear vegetations  2/14 - PCCM consult  2/15 - Junctional rhythm with precedex, mental status improved. Transferred to [facility] for possible HD  2/18 - transferred to ICU for diaphoresis, ST changes and confusion  LINES / TUBES:  Left Midline 09/13/13>>  L IJ HD catheter 2/18 >> pt pulled out 2/21 L [place] HD catheter 2/21  HPI/Subjective: Alert.  Examined in HD - still confused - states "I really don't know where I am".  Assessment/Plan:  MSSA bacteremia w/ sepsis / infective endocarditis w/ cerebral septic emboli / UTI -Blood cultures x 2 MSSA - f/u cultures negative on abx -Chest x-ray suggested early pneumonia of left lung base with small left pleural effusion -UA cx with MSSA UTI -TTE (2/10) without vegetation -awaiting TEE at some point this week -has prior tissue AVR -ID directing abx-had recurrent fever overnight so ID repeating blood cx's and c dif PCR  Toxic metabolic encephalopathy -secondary to severe sepsis, azotemia, and septic brain emboli -MRI Brain (2/10) with multiple  bilateral supra tentorial and right cerebellar regions c/w acute partially hemorrhagic infarcts suspected 2/2 septic emboli -Continue supportive care and tx underlying causes -Neurology has evaluated -QTc prolonged so haldol not safe  -[id number] 0000000, folic acid XX123456, and ammonia 17  Severe thrombocytopenia  -Platelet counts dropped to a nadir of 23 - felt to be due to sepsis - now resolved  Acute Renal Failure/Acute tubular necrosis -due to bacteremia/immune complex or gentamicin -baseline renal fnx: 18 / 0.93 - progressive failure since admit - Nephrology following -Transferred to [name] 09/15/2013 for hemodialysis -Hemodialysis started 09/17/2013 -Renal US WNL  -Nephrology directing care  Abdominal distention/diarrhea -distention improved, but diarrhea continues - flexiseal as pt has been "playing in his stool" - diarrhea likely due to abx - C diff negative 2/16 and 2/17 - ID has ordered recheck  Hypokalemia  - carefully replete prn  NSTEMI (Type II) -peak TNI 2.26 - no plans for cath at this point - has stable ST elevation r/t LBBB  Acute Systolic HF and Chronic grade [name] Diastolic CHF  -ECHO Oct 123456 with EF 60-65% with severe LVH and no RWMA -ECHO Sep 09, 2013 EF 45-50% with septal and apical hypokinesis  History of thyroid cancer, post thyroidectomy hypothyroidism  -TSH elevated at 10.174 - Synthroid increased - repeat labs in 4-6 weeks  Hypertension  -Continue when necessary IV metoprolol - Holding lisinopril due to acute renal failure  Hyperglycemia  -Hemoglobin A1c = 5.7 - follow trend   History of depression  -hold Zoloft secondary to acute encephalopathy.   Hyponatremia  -due to volume overload -corrected after initiation of hemodialysis  DVT prophylaxis:  SCDs >> SQ heparin Code Status: Full Family Communication: No family at bedside Disposition:  Transfer to telemetry  Consultants: Cardiology PCCM Nephrology Neurology Infectious  disease Hematology  Antibiotics: Nafcillin 2/10>> Rifampin 2/15 >> Vancomycin 2/10>>2/11 Gentamicin 2/10>> 2/13   Objective: Blood pressure 120/83, pulse 103, temperature 99 F (37.2 C), temperature source Oral, resp. rate 26, height 6\' [name]"  ([name].854 m), weight 230 lb 2.6 oz (104.4 kg), SpO2 100.00%.  Intake/Output Summary (Last 24 hours) at 09/24/13 1244 Last data filed at 09/24/13 0600  Gross per 24 hour  Intake    870 ml  Output      0 ml  Net    870 ml   Exam: General: No acute respiratory distress - alert but confused  Lungs: Clear to auscultation bilaterally without wheezes or crackles, RA Cardiovascular: RRR w/ 2/6 holosystolic M w/o gallup or rub  Abdomen: Nontender, nondistended, soft, bowel sounds positive, no rebound, no ascites, no appreciable mass Musculoskeletal: No significant cyanosis, clubbing of bilateral lower extremities - [name]+ B LE edema  Neurological: Awake but not oriented, still seems to have some left side neglect   Scheduled Meds:  Scheduled Meds: . antiseptic oral rinse  15 mL Mouth Rinse q12n4p  . chlorhexidine  15 mL Mouth Rinse BID  . feeding supplement (RESOURCE BREEZE)  [name] Container Oral TID WC  . heparin subcutaneous  5,000 Units Subcutaneous 3 times per day  . insulin aspart  0-9 Units Subcutaneous TID WC  . levothyroxine  200 mcg Oral QAC breakfast  . nafcillin IV  2 g Intravenous Q4H  . pantoprazole  40 mg Oral Q1200  . potassium chloride  20 mEq Oral Daily  . rifampin (RIFADIN) IVPB  300 mg Intravenous 3 times per day  . sevelamer carbonate  [name],600 mg Oral TID WC  . sodium chloride  3 mL Intravenous Q12H   Data Reviewed: Basic Metabolic Panel:  Recent Labs Lab 09/19/13 0455 09/20/13 0555 09/22/13 2010 09/23/13 0339 09/24/13 0050  NA 137 133* 134* 138 135*  K 3.6* 3.2* 3.0* 3.5* 3.8  CL 96 93* 93* 98 93*  CO2 22 19 19 23 20   GLUCOSE 118* 110* 132* 112* 105*  BUN 64* 71* 55* 31* 41*  CREATININE 7.79* 9.00* 9.36* 6.42* 7.51*   CALCIUM 7.[name]* 7.[name]* 7.5* 7.6* 7.7*  MG 2.2  --   --   --   --   PHOS 6.7*  --  8.2*  --   --     Liver Function Tests:  Recent Labs Lab 09/18/13 0330 09/19/13 0455 09/20/13 0555 09/22/13 2010 09/24/13 0050  AST 33 24 24  --  23  ALT 29 22 19   --  19  ALKPHOS 73 59 52  --  54  BILITOT [name].7* [name].7* [name].[name]  --  0.9  PROT 6.[name] 5.6* 5.3*  --  5.9*  ALBUMIN [name].4* [name].4* [name].3* [name].2* [name].3*   CBC:  Recent Labs Lab 09/17/13 1447 09/18/13 0330 09/19/13 0455 09/20/13 0555 09/23/13 0339 09/24/13 0050  WBC 24.7* 23.9* 18.4* 15.7* 15.3* 12.[name]*  NEUTROABS 23.0* 22.0* 16.9*  --   --   --   HGB 12.3* 12.7* 10.3* 9.4* 9.0* 9.0*  HCT 33.6* 34.5* 28.4* 26.5* 24.8* 25.5*  MCV 81.4 80.8 81.8 81.0 80.8 82.0  PLT 145* 205 214 244 275 255   Cardiac Enzymes:  Recent Labs Lab 09/17/13 1830  TROPONINI 0.36*   CBG:  Recent Labs Lab 09/23/13 0829 09/23/13 1323 09/23/13 1656 09/23/13 2145 09/24/13 0741  GLUCAP 97 242* 196* 100* 91    Recent Results (from the past 240 hour(s))  CLOSTRIDIUM DIFFICILE BY PCR     Status: None   Collection Time    09/15/13  2:00 AM      Result Value Ref Range Status   C difficile by pcr NEGATIVE  NEGATIVE Final   Comment: Performed at [facility]  MRSA PCR SCREENING     Status: None   Collection Time    09/15/13  5:31 PM      Result Value Ref Range Status   MRSA by PCR NEGATIVE  NEGATIVE Final   Comment:            The GeneXpert MRSA Assay (FDA     approved for NASAL specimens     only), is one component of a     comprehensive MRSA colonization     surveillance program. It is not     intended to diagnose MRSA     infection nor to guide or     monitor treatment for     MRSA infections.  CLOSTRIDIUM DIFFICILE BY PCR     Status: None   Collection Time    09/16/13 10:53 AM      Result Value Ref Range Status   C difficile by pcr NEGATIVE  NEGATIVE Final     Studies:  Recent x-ray studies have been reviewed in detail by the Attending  Physician  Time spent : 25 mins  Erin Hearing, ANP [facility] For Consults/Admissions - Flow Manager - [phone] Office  [phone] Pager [phone]  On-Call/Text Page:      [email or web address]      password [facility]  09/24/2013, 12:44 PM   LOS: 16 days   I have personally examined this patient and reviewed the entire database. I have reviewed the above note, made any necessary editorial changes, and agree with its content.  Cherene Altes, MD [facility]

## 2013-09-25 ENCOUNTER — Inpatient Hospital Stay (HOSPITAL_COMMUNITY): Payer: Medicaid Other

## 2013-09-25 DIAGNOSIS — I214 Non-ST elevation (NSTEMI) myocardial infarction: Secondary | ICD-10-CM

## 2013-09-25 LAB — GLUCOSE, CAPILLARY
Glucose-Capillary: 90 mg/dL (ref 70–99)
Glucose-Capillary: 99 mg/dL (ref 70–99)
Glucose-Capillary: 96 mg/dL (ref 70–99)
Glucose-Capillary: 127 mg/dL — ABNORMAL HIGH (ref 70–99)

## 2013-09-25 LAB — CBC
HCT: 22.4 % — ABNORMAL LOW (ref 39.0–52.0)
Hemoglobin: 7.9 g/dL — ABNORMAL LOW (ref 13.0–17.0)
MCH: 29.2 pg (ref 26.0–34.0)
MCHC: 35.3 g/dL (ref 30.0–36.0)
MCV: 82.7 fL (ref 78.0–100.0)
Platelets: 243 10*3/uL (ref 150–400)
RBC: 2.71 MIL/uL — ABNORMAL LOW (ref 4.22–5.81)
RDW: 17.3 % — ABNORMAL HIGH (ref 11.5–15.5)
WBC: 11.5 10*3/uL — ABNORMAL HIGH (ref 4.0–10.5)

## 2013-09-25 LAB — COMPREHENSIVE METABOLIC PANEL
ALT: 16 U/L (ref 0–53)
AST: 22 U/L (ref 0–37)
Albumin: 1.4 g/dL — ABNORMAL LOW (ref 3.5–5.2)
Alkaline Phosphatase: 50 U/L (ref 39–117)
BUN: 20 mg/dL (ref 6–23)
CO2: 25 mEq/L (ref 19–32)
Calcium: 7.8 mg/dL — ABNORMAL LOW (ref 8.4–10.5)
Chloride: 97 mEq/L (ref 96–112)
Creatinine, Ser: 5.19 mg/dL — ABNORMAL HIGH (ref 0.50–1.35)
GFR calc Af Amer: 13 mL/min — ABNORMAL LOW (ref >90)
GFR calc non Af Amer: 11 mL/min — ABNORMAL LOW (ref >90)
Glucose, Bld: 122 mg/dL — ABNORMAL HIGH (ref 70–99)
Potassium: 4 mEq/L (ref 3.7–5.3)
Sodium: 137 mEq/L (ref 137–147)
Total Bilirubin: 0.6 mg/dL (ref 0.3–1.2)
Total Protein: 5.7 g/dL — ABNORMAL LOW (ref 6.0–8.3)

## 2013-09-25 LAB — LIPID PANEL
Cholesterol: 145 mg/dL (ref 0–200)
HDL: 27 mg/dL — ABNORMAL LOW (ref >39)
LDL Cholesterol: 87 mg/dL (ref 0–99)
Total CHOL/HDL Ratio: 5.4 RATIO
Triglycerides: 156 mg/dL — ABNORMAL HIGH (ref <150)
VLDL: 31 mg/dL (ref 0–40)

## 2013-09-25 LAB — PROTIME-INR
INR: 1.43 (ref 0.00–1.49)
Prothrombin Time: 17.1 seconds — ABNORMAL HIGH (ref 11.6–15.2)

## 2013-09-25 MED ORDER — RENA-VITE PO TABS
1.0000 | ORAL_TABLET | Freq: Every day | ORAL | Status: DC
  Administered 2013-09-25 – 2013-10-09 (×14): 1 via ORAL
  Filled 2013-09-25 (×16): qty 1

## 2013-09-25 MED ORDER — HEPARIN SODIUM (PORCINE) 1000 UNIT/ML IJ SOLN
INTRAMUSCULAR | Status: AC
  Filled 2013-09-25: qty 1

## 2013-09-25 MED ORDER — MIDAZOLAM HCL 2 MG/2ML IJ SOLN
INTRAMUSCULAR | Status: AC | PRN
  Administered 2013-09-25 (×3): 1 mg via INTRAVENOUS

## 2013-09-25 MED ORDER — FENTANYL CITRATE 0.05 MG/ML IJ SOLN
INTRAMUSCULAR | Status: AC | PRN
  Administered 2013-09-25 (×3): 50 ug via INTRAVENOUS

## 2013-09-25 MED ORDER — GELATIN ABSORBABLE 12-7 MM EX MISC
CUTANEOUS | Status: AC
  Filled 2013-09-25: qty 1

## 2013-09-25 MED ORDER — MIDAZOLAM HCL 2 MG/2ML IJ SOLN
INTRAMUSCULAR | Status: AC
  Filled 2013-09-25: qty 4

## 2013-09-25 MED ORDER — FENTANYL CITRATE 0.05 MG/ML IJ SOLN
INTRAMUSCULAR | Status: AC
  Filled 2013-09-25: qty 4

## 2013-09-25 NOTE — Progress Notes (Signed)
       Patient Name: Bradley [name] Date of Encounter: 09/25/2013    SUBJECTIVE: No CV complaints.  TELEMETRY:  NSR Filed Vitals:   09/24/13 1415 09/24/13 1623 09/24/13 2126 09/25/13 0500  BP:  123/73 115/78 145/75  Pulse: 115 104 101 93  Temp:  99.9 F (37.7 C) 99.2 F (37.3 C) 98.9 F (37.2 C)  TempSrc:  Oral Oral Oral  Resp:  20 20 20   Height:   6\' 1"  (1.854 m)   Weight:   222 lb 0.1 oz (100.7 kg)   SpO2:  92% 94% 94%    Intake/Output Summary (Last 24 hours) at 09/25/13 0915 Last data filed at 09/24/13 1336  Gross per 24 hour  Intake     50 ml  Output   3897 ml  Net  -3847 ml    LABS: Basic Metabolic Panel:  Recent Labs  09/22/13 2010  09/24/13 0050 09/25/13 0452  NA 134*  < > 135* 137  K 3.0*  < > 3.8 4.0  CL 93*  < > 93* 97  CO2 19  < > 20 25  GLUCOSE 132*  < > 105* 122*  BUN 55*  < > 41* 20  CREATININE 9.36*  < > 7.51* 5.19*  CALCIUM 7.5*  < > 7.7* 7.8*  PHOS 8.2*  --   --   --   < > = values in this interval not displayed. CBC:  Recent Labs  09/24/13 0050 09/25/13 0452  WBC 12.1* 11.5*  HGB 9.0* 7.9*  HCT 25.5* 22.4*  MCV 82.0 82.7  PLT 255 243    Fasting Lipid Panel:  Recent Labs  09/25/13 0452  CHOL 145  HDL 27*  LDLCALC 87  TRIG 156*  CHOLHDL 5.4    Radiology/Studies:  n/a  Physical Exam: Blood pressure 145/75, pulse 93, temperature 98.9 F (37.2 C), temperature source Oral, resp. rate 20, height 6\' 1"  (1.854 m), weight 222 lb 0.1 oz (100.7 kg), SpO2 94.00%. Weight change: 3.5 oz (0.1 kg)   No pericardial rub  ASSESSMENT:  1. Presumed endocarditis 2. NSTEMI type 2  Plan:  1. 6 weeks antibiotics 2. No specific cardiac recs. 3. Call if further help needed.  Demetrios Isaacs 09/25/2013, 9:15 AM

## 2013-09-25 NOTE — Sedation Documentation (Signed)
Vital signs stable. 

## 2013-09-25 NOTE — Progress Notes (Signed)
[facility]                                                                              Patient Demographics  Bradley [name], is a 58 y.o. male, DOB - Nov 28, 1955, [id number]  Admit date - 09/08/2013   Admitting Physician Berle Mull, MD  Outpatient Primary MD for the patient is [name], MD  LOS - 70   Chief Complaint  Patient presents with  . Altered Mental Status      Brief narrative:  58 year old male with history of aortic stenosis, status post pediatric aortic valvulotomy at age 78 followed by tissue AVR (Bentall procedure) 02/2000 with cardiac cath 02/2010 with normal coronaries, chronic LBBB, thyroid cancer post thyroidectomy >> hypothyroidism, HTN, and depression, who had been generally unwell for 2-3 weeks. He was seen in the ED on 09/06/13 and assessed as influenza related vomiting, diarrhea, abdominal pain, body aches, fever and mild confusion and was ultimately discharged on Tamiflu. Patient worsened at home with progressive confusion/disorientation and was admitted with sepsis, septic brain emboli and rule out infective endocarditis.   Assessment & Plan   Severe sepsis secondary to MSSA bacteremia w/ sepsis / infective endocarditis w/ cerebral septic emboli / UTI  -Blood cultures x 2 MSSA - follow up cultures negative, however on antibiotics -Chest x-ray suggested early pneumonia of left lung base with small left pleural effusion  -UA cx with MSSA UTI  -TTE (09/09/13) without vegetation  -Cardiology recommends 6 weeks of antibiotics for presumed endocarditis, no TEE at this time -has prior tissue AVR  -ID directing antibiotics, currently on naficillin and rifampin -Patient continues to have low grade fever, however, leukocytosis trending downward -ID would like repeat Cdiff PCR, although negative on 2/16 and 09/16/13 -Blood cultures from 09/24/13, show no growth to date  Toxic metabolic encephalopathy  -secondary to severe sepsis, azotemia, and septic  brain emboli  -MRI Brain (09/09/13) with multiple bilateral supra tentorial and right cerebellar regions consistent with acute partially hemorrhagic infarcts suspected secondary to septic emboli  -Continue supportive care and treat underlying causes  -Neurology has evaluated  -QTc prolonged so haldol not safe  -[id number], folic acid [id number], and ammonia 17   Severe thrombocytopenia  -Resolved, Platelet counts dropped to a nadir of 23  -felt to be due to sepsis   Acute Renal Failure/Acute tubular necrosis  -due to bacteremia/immune complex or gentamicin  -baseline renal fnx: 18 / 0.93 - progressive failure since admit -Transferred to [name] 09/15/2013 for hemodialysis  -Hemodialysis started 09/17/2013  -Renal US normal kidneys, no hydronephrosis -Nephrology directing care   Abdominal distention/diarrhea  -distention improved, but diarrhea continues  -flexiseal as pt has been "playing in his stool"  -diarrhea likely due to abx  -C diff negative 2/16 and 2/17  -ID has ordered recheck   Hypokalemia  -Resolved, will continue to monitor  -Continue K-dur 43mEq daily  NSTEMI (Type II)  -peak TNI 2.26  -Cardiology has no plans for cath at this point  -has stable ST elevation r/t LBBB   Acute Systolic HF and Chronic grade 1 Diastolic CHF  -ECHO Oct [id number] with EF 60-65% with severe LVH and no RWMA  -ECHO  Sep 09, 2013 EF 45-50% with septal and apical hypokinesis   History of thyroid cancer, post thyroidectomy hypothyroidism  -TSH elevated at 10.174  -Synthroid increased  -Will need monitoring and repeat labs in 4-6 weeks   Hypertension  -Holding lisinopril due to acute renal failure  -Continue metoprolol IV PRN  Hyperglycemia  -Hemoglobin A1c 5.7  -Continue ISS and CBG monitoring  History of depression  -hold Zoloft secondary to acute encephalopathy.   Hyponatremia  -Resolved, due to volume overload  -corrected after initiation of hemodialysis  Code Status: Full  Family  Communication: None at bedside  Disposition Plan: Admitted.  Will consult CIR  Time Spent in minutes   35 minutes  Procedures/Course  2/09 - admit  2/10 - ID, Heme and Cards consults. TTE EF 45%. No clear vegetations  2/14 - PCCM consult  2/15 - Junctional rhythm with precedex, mental status improved. Transferred to [facility] for possible HD 2/16 - Renal U/S- normal kidneys, no hydronephrosis  2/18 - transferred to ICU for diaphoresis, ST changes and confusion 2/21 - patient pulled out L IJ HD catheter that was placed on 09/17/13 2/21 - L [place] HD catheter placed 2/26 - Conversion of R IJ temp cath to [facility] cath  [place]   Nephrology PCCM Infectious disease Interventional radiology Cardiology Neurology Hematology  DVT Prophylaxis Heparin   Lab Results  Component Value Date   PLT 243 09/25/2013    Medications  Scheduled Meds: . antiseptic oral rinse  15 mL Mouth Rinse q12n4p  . [START ON 09/26/2013] aspirin EC  81 mg Oral Daily  . chlorhexidine  15 mL Mouth Rinse BID  . feeding supplement (RESOURCE BREEZE)  1 Container Oral TID WC  . heparin subcutaneous  5,000 Units Subcutaneous 3 times per day  . insulin aspart  0-9 Units Subcutaneous TID WC  . levothyroxine  200 mcg Oral QAC breakfast  . nafcillin IV  2 g Intravenous Q4H  . pantoprazole  40 mg Oral Q1200  . potassium chloride  20 mEq Oral Daily  . rifampin  600 mg Oral Daily  . sevelamer carbonate  1,600 mg Oral TID WC  . sodium chloride  3 mL Intravenous Q12H   Continuous Infusions:  PRN Meds:.sodium chloride, sodium chloride, acetaminophen, acetaminophen, feeding supplement (NEPRO CARB STEADY), heparin, heparin, hydrOXYzine, lidocaine (PF), lidocaine-prilocaine, liver oil-zinc oxide, metoprolol, morphine injection, ondansetron (ZOFRAN) IV, pentafluoroprop-tetrafluoroeth, zolpidem  Antibiotics    Anti-infectives   Start     Dose/Rate Route Frequency Ordered Stop   09/24/13 1400  rifampin (RIFADIN) capsule 600 mg      600 mg Oral Daily 09/24/13 1254     09/14/13 1400  rifampin (RIFADIN) 300 mg in sodium chloride 0.9 % 100 mL IVPB  Status:  Discontinued     300 mg 200 mL/hr over 30 Minutes Intravenous 3 times per day 09/14/13 1210 09/24/13 1254   09/11/13 1800  gentamicin (GARAMYCIN) 90 mg in dextrose 5 % 50 mL IVPB  Status:  Discontinued     90 mg 104.5 mL/hr over 30 Minutes Intravenous Every 12 hours 09/11/13 0932 09/13/13 0802   09/09/13 1800  nafcillin 2 g in dextrose 5 % 50 mL IVPB     2 g 100 mL/hr over 30 Minutes Intravenous Every 4 hours 09/09/13 1415     09/09/13 1430  nafcillin 2 g in dextrose 5 % 50 mL IVPB     2 g 100 mL/hr over 30 Minutes Intravenous  Once 09/09/13 1415 09/09/13 1603  09/09/13 1415  nafcillin injection 2 g  Status:  Discontinued     2 g Intravenous Every 4 hours 09/09/13 1410 09/09/13 1413   09/09/13 0800  vancomycin (VANCOCIN) IVPB 1000 mg/200 mL premix  Status:  Discontinued     1,000 mg 200 mL/hr over 60 Minutes Intravenous Every 8 hours 09/09/13 0341 09/11/13 0829   09/09/13 0500  gentamicin (GARAMYCIN) 90 mg in dextrose 5 % 50 mL IVPB  Status:  Discontinued     1 mg/kg  90.7 kg 104.5 mL/hr over 30 Minutes Intravenous 3 times per day 09/09/13 0340 09/11/13 0932   09/09/13 0400  ceFEPIme (MAXIPIME) 2 g in dextrose 5 % 50 mL IVPB  Status:  Discontinued     2 g 100 mL/hr over 30 Minutes Intravenous 3 times per day 09/09/13 0340 09/09/13 1410   09/08/13 2315  piperacillin-tazobactam (ZOSYN) IVPB 3.375 g     3.375 g 100 mL/hr over 30 Minutes Intravenous  Once 09/08/13 2307 09/09/13 0043   09/08/13 2315  vancomycin (VANCOCIN) IVPB 1000 mg/200 mL premix     1,000 mg 200 mL/hr over 60 Minutes Intravenous  Once 09/08/13 2307 09/09/13 0230        Subjective:   Bradley [name] seen and examined today.  Patient is oriented to self.  He states, "I don't know where I am."  He is unable to state his age, but can state his birth date. He denies any pain.  Objective:    Filed Vitals:   09/24/13 1415 09/24/13 1623 09/24/13 2126 09/25/13 0500  BP:  123/73 115/78 145/75  Pulse: 115 104 101 93  Temp:  99.9 F (37.7 C) 99.2 F (37.3 C) 98.9 F (37.2 C)  TempSrc:  Oral Oral Oral  Resp:  20 20 20   Height:   6\' 1"  (1.854 m)   Weight:   100.7 kg (222 lb 0.1 oz)   SpO2:  92% 94% 94%    Wt Readings from Last 3 Encounters:  09/24/13 100.7 kg (222 lb 0.1 oz)  09/06/13 90.719 kg (200 lb)  05/07/13 94.348 kg (208 lb)     Intake/Output Summary (Last 24 hours) at 09/25/13 0829 Last data filed at 09/24/13 1336  Gross per 24 hour  Intake     50 ml  Output   3897 ml  Net  -3847 ml    Exam  General: Well developed, well nourished, NAD, appears stated age  HEENT: NCAT, PERRLA, EOMI, Anicteic Sclera, mucous membranes moist.   Neck: Supple, no JVD, no masses  Cardiovascular: S1 S2 auscultated, 2/6 SEM, Regular rate and rhythm.  Respiratory: Clear to auscultation bilaterally with equal chest rise  Abdomen: Soft, nontender, nondistended, + bowel sounds  Extremities: warm dry without cyanosis clubbing. Trace edema  Neuro: Awake and alert, however, not oriented. Has left hemineglect.  Skin: Without rashes exudates or nodules  Data Review   Micro Results Recent Results (from the past 240 hour(s))  MRSA PCR SCREENING     Status: None   Collection Time    09/15/13  5:31 PM      Result Value Ref Range Status   MRSA by PCR NEGATIVE  NEGATIVE Final   Comment:            The GeneXpert MRSA Assay (FDA     approved for NASAL specimens     only), is one component of a     comprehensive MRSA colonization     surveillance program. It is not  intended to diagnose MRSA     infection nor to guide or     monitor treatment for     MRSA infections.  CLOSTRIDIUM DIFFICILE BY PCR     Status: None   Collection Time    09/16/13 10:53 AM      Result Value Ref Range Status   C difficile by pcr NEGATIVE  NEGATIVE Final    Radiology Reports Dg Chest 2  View  09/06/2013   CLINICAL DATA:  Fever, nausea, vomiting.  EXAM: CHEST  2 VIEW  COMPARISON:  08/17/2011  FINDINGS: Prior median sternotomy and valve replacement. Mild cardiomegaly and vascular congestion. Low lung volumes with bibasilar atelectasis. No effusions. No acute bony abnormality.  IMPRESSION: Cardiomegaly, bibasilar atelectasis.   Electronically Signed   By: Rolm Baptise M.D.   On: 09/06/2013 19:34   Dg Abd 1 View  09/17/2013   CLINICAL DATA:  Abdominal distension  EXAM: ABDOMEN - 1 VIEW  COMPARISON:  CT ABD/PELVIS W CM dated 09/06/2013  FINDINGS: There is at least moderate gas distention of multiple loops of predominantly small bowel, though there may be minimal gaseous distention of the splenic flexure of the colon.  Examination is degraded secondary to the coned field-of-view on solitary provided radiograph.  Nondiagnostic evaluation for pneumoperitoneum secondary supine positioning and exclusion of the lower thorax. No definite pneumatosis or portal venous gas.  No definite abnormal intra-abdominal calcifications.  Degenerative change the lower lumbar spine.  IMPRESSION: Degraded examination with findings concerning for small bowel obstruction though there may be a minimal amount of air within the splenic flexure of the colon. Clinical correlation is advised. Further evaluation with complete abdominal radiographic series may be performed as clinically indicated.   Electronically Signed   By: Sandi Mariscal M.D.   On: 09/17/2013 10:38   Ct Head Wo Contrast  09/12/2013   CLINICAL DATA:  Increased mental status change, evaluate for CVA versus worsening bleed.  EXAM: CT HEAD WITHOUT CONTRAST  TECHNIQUE: Contiguous axial images were obtained from the base of the skull through the vertex without intravenous contrast.  COMPARISON:  Prior MRI and and CT from 09/09/2013.  FINDINGS: Study is degraded by motion artifact.  Vasogenic edema as trending linear hyperdensity within the left parietal lobe is stable  not significantly changed relative to the previous examination. Vague hypodensities involving the supratentorial brain and right cerebellar hemisphere are not significantly changed relative to recent MRI. No definite a new intracranial infarct identified. Previously seen hyperdense foci within the mid bilateral frontal lobes are less conspicuous as compared to prior. No new intracranial hemorrhage. No mass lesion or midline shift. Ventricles are stable in size without evidence of hydrocephalus. No extra-axial fluid collection.  The calvarium is grossly intact. Orbits are normal. Paranasal sinuses and mastoid air cells remain clear.  IMPRESSION: 1. Limited study due to motion. No significant interval change in subacute left parietal infarct with small amount of associated hemorrhage. Additional multi focal infarcts involving the supratentorial brain and right cerebellar hemisphere are not significantly changed as well relative to recent MRI from 09/09/2013. No definite new intracranial infarct identified. No new intracranial hemorrhage.   Electronically Signed   By: Jeannine Boga M.D.   On: 09/12/2013 22:02   Ct Head Wo Contrast  09/09/2013   CLINICAL DATA:  Fever.  Altered mental status.  EXAM: CT HEAD WITHOUT CONTRAST  TECHNIQUE: Contiguous axial images were obtained from the base of the skull through the vertex without intravenous contrast.  COMPARISON:  CT HEAD W/O CM dated 09/06/2013; DG CHEST 1V PORT dated 09/08/2013; CT ABD/PELVIS W CM dated 09/06/2013  FINDINGS: Small focus of linear high attenuation in the left parietal lobe appear similar to the prior exam. The amount of edema at has increased compared to the prior exam suggesting a subacute process. This appears wedge-shaped in may represent an embolic infarct. There is a similar faint area of high attenuation in the left frontal lobe on image number 22 low with a more subtle area in the right frontal lobe that is more equivocal. The dural venous  sinuses appear within normal limits. No mass lesion or midline shift. No hydrocephalus.  The calvarium is intact. Left-sided nasal septal spur. Mastoid air cells clear. Paranasal sinuses are normal.  IMPRESSION: 1. Increasing edema around linear high attenuation in the left parietal lobe. Followup MRI with and without contrast is recommended for further assessment. The patient presents with an infectious syndrome and septic emboli are in the differential considerations. Infarct with a small amount of hemorrhage or cortical laminar necrosis as well as developmental venous anomaly remain in the differential considerations. MRI should be useful in further characterization. 2. Dr. Leonel Ramsay called discuss the case at 0230 hr on the day of examination.   Electronically Signed   By: Dereck Ligas M.D.   On: 09/09/2013 02:32   Ct Head Wo Contrast  09/06/2013   CLINICAL DATA:  Confusion.  EXAM: CT HEAD WITHOUT CONTRAST  TECHNIQUE: Contiguous axial images were obtained from the base of the skull through the vertex without intravenous contrast.  COMPARISON:  None.  FINDINGS: There is a 1.3 cm curvilinear focus of hyperdensity in the subcortical left parietal lobe (image 25). There is no evidence of surrounding edema. There is no other evidence of acute intracranial hemorrhage. There is no evidence of acute cortical infarct, mass, midline shift, or extra-axial fluid collection. Subcentimeter low density focus in the left caudate is suggestive of a remote lacunar infarct. Ventricles and sulci are normal. There is very mild mucosal thickening in the frontal sinuses and ethmoid air cells bilaterally. Mastoid air cells are clear. Orbits are unremarkable.  IMPRESSION: 1. Curvilinear hyperdensity in the subcortical left parietal lobe. Acute intracranial hemorrhage cannot be completely excluded, however there is no surrounding edema or evidence of hemorrhage elsewhere in the brain and the patient is not presenting in the  setting of acute trauma. Given the curvilinear configuration, this may be vascular and represent an incidental developmental venous anomaly. 2. Remote left caudate lacunar infarct.   Electronically Signed   By: Logan Bores   On: 09/06/2013 22:29   Mr Brain W Wo Contrast  09/09/2013   CLINICAL DATA:  Post aortic valve replacement 2011. Presenting with altered mental status, elevated white count and abnormal CT.  EXAM: MRI HEAD WITHOUT AND WITH CONTRAST  TECHNIQUE: Multiplanar, multiecho pulse sequences of the brain and surrounding structures were obtained without and with intravenous contrast.  CONTRAST:  47mL MULTIHANCE GADOBENATE DIMEGLUMINE 529 MG/ML IV SOLN  COMPARISON:  09/09/2013 and 09/06/2013 CT.  No comparison MR.  FINDINGS: Exam is motion degraded.  Multiple bilateral supra tentorial and right cerebellar regions of restricted motion some of which are partially hemorrhagic with largest area left parietal lobe. In the present clinical setting, these findings are consistent with acute partially hemorrhagic infarcts and possibly related to septic emboli.  At most there is very minimal enhancement of the left parietal partially hemorrhagic infarct.  No intracranial mass lesion seen separate from the above  described findings.  Major intracranial vascular structures are patent.  IMPRESSION: Exam is motion degraded.  Multiple bilateral supra tentorial and right cerebellar regions of restricted motion some of which are partially hemorrhagic with largest area left parietal lobe. In the present clinical setting, these findings are consistent with acute partially hemorrhagic infarcts and possibly related to septic emboli.  These results were called by telephone at the time of interpretation on 09/09/2013 at 11:54 AM to Dr. Algis Liming , who verbally acknowledged these results.   Electronically Signed   By: Chauncey Cruel M.D.   On: 09/09/2013 12:03   Ct Abdomen Pelvis W Contrast  09/06/2013   CLINICAL DATA:   58 year old male abdominal pain, vomiting, diarrhea and low grade fever.  EXAM: CT ABDOMEN AND PELVIS WITH CONTRAST  TECHNIQUE: Multidetector CT imaging of the abdomen and pelvis was performed using the standard protocol following bolus administration of intravenous contrast.  CONTRAST:  100 cc intravenous Omnipaque 300  COMPARISON:  None.  FINDINGS: Cardiomegaly and aortic valve replacement noted. Mild bibasilar atelectasis/scarring is identified.  The liver, spleen, pancreas, gallbladder adrenal glands and kidneys are unremarkable except for small bilateral renal cysts.  There is no evidence of free fluid, enlarged lymph nodes, biliary dilation or abdominal aortic aneurysm.  The bowel, bladder and appendix are unremarkable. There is no evidence of bowel obstruction, abscess or pneumoperitoneum.  A small right inguinal hernia containing fat is identified.  Mild prostate enlargement is present.  No acute or suspicious bony abnormalities are identified.  IMPRESSION: No evidence of acute abnormality.  Cardiomegaly, aortic valve replacement, prostate enlargement and small right inguinal hernias containing fat.   Electronically Signed   By: Hassan Rowan M.D.   On: 09/06/2013 22:32   Ir Veno/ext/uni Right  09/14/2013   CLINICAL DATA:  Poor venous access, request for PICC  EXAM: DUAL LUMEN LEFT MIDLINE VENOUS CATHETER PLACEMENT WITH ULTRASOUND AND FLUOROSCOPIC GUIDANCE  ADDITIONAL VENOGRAPHY OF THE RIGHT UPPER EXTREMITY  FLUOROSCOPY TIME:  7 MIN AND 30 SECONDS.  PROCEDURE: The patient's family member was advised of the possible risks and complications and agreed to undergo the procedure. The patient was then brought to the angiographic suite for the procedure.  The right and left arm was prepped with chlorhexidine, draped in the usual sterile fashion using maximum barrier technique (cap and mask, sterile gown, sterile gloves, large sterile sheet, hand hygiene and cutaneous antisepsis) and infiltrated locally with 1%  Lidocaine.  Ultrasound demonstrated patency of the right and left brachial vein, and this was documented with an image. Under real-time ultrasound guidance, the right brachial vein was accessed with a 21 gauge micropuncture needle and image documentation was performed. A 0.018 wire was introduced in to the vein. Over this, a dilator introducer was placed, the wire was unable to be advanced proximally so a venogram was performed with 10 cc of Omnipaque, this revealed venous extravasation. The right brachial vein site was abandoned and manual pressure was held until the site was soft with no continued bleeding. The left brachial vein was accessed with a 21 gauge micropuncture needle and multiple guidewires used to advance to the SVC/RA junction. A 5 French dual lumen power-injectable PICC was advanced, however unable to cross proximally to the lower SVC/right atrial junction, the PICC was left midline for this reason. Fluoroscopy during the procedure and fluoro spot radiograph confirms appropriate catheter position. The catheter aspirated and flushed well and was then covered with a sterile dressing.  Complications: Extravasation of 10 cc of  Omnipaque to right brachial vein. Unable to advance left brachial vein PICC to the SVC/RA junction, left brachial PICC was placed midline.  IMPRESSION: Successful left arm midline VENOUS CATHETER placement with ultrasound and fluoroscopic guidance. The catheter is ready for use.  Read By:  Tsosie Billing PA-C   Electronically Signed   By: Aletta Edouard M.D.   On: 09/14/2013 11:51   US Renal  09/15/2013   CLINICAL DATA:  Acute renal failure.  EXAM: RENAL/URINARY TRACT ULTRASOUND COMPLETE  COMPARISON:  CT abdomen and pelvis 09/06/2013.  FINDINGS: Right Kidney:  Length: Approximately 12.7 cm. No hydronephrosis. Well-preserved cortex. No shadowing calculi. Normal parenchymal echotexture without focal abnormalities.  Left Kidney:  Length: Approximately 12.5 cm. No hydronephrosis.  Well-preserved cortex. No shadowing calculi. Normal parenchymal echotexture without focal abnormalities.  Bladder:  Decompressed by Foley catheter.  IMPRESSION: Normal kidneys.  Specifically, no evidence of hydronephrosis.   Electronically Signed   By: Evangeline Dakin M.D.   On: 09/15/2013 10:55   Ir Fluoro Guide Cv Line Left  09/14/2013   CLINICAL DATA:  Poor venous access, request for PICC  EXAM: DUAL LUMEN LEFT MIDLINE VENOUS CATHETER PLACEMENT WITH ULTRASOUND AND FLUOROSCOPIC GUIDANCE  ADDITIONAL VENOGRAPHY OF THE RIGHT UPPER EXTREMITY  FLUOROSCOPY TIME:  7 MIN AND 30 SECONDS.  PROCEDURE: The patient's family member was advised of the possible risks and complications and agreed to undergo the procedure. The patient was then brought to the angiographic suite for the procedure.  The right and left arm was prepped with chlorhexidine, draped in the usual sterile fashion using maximum barrier technique (cap and mask, sterile gown, sterile gloves, large sterile sheet, hand hygiene and cutaneous antisepsis) and infiltrated locally with 1% Lidocaine.  Ultrasound demonstrated patency of the right and left brachial vein, and this was documented with an image. Under real-time ultrasound guidance, the right brachial vein was accessed with a 21 gauge micropuncture needle and image documentation was performed. A 0.018 wire was introduced in to the vein. Over this, a dilator introducer was placed, the wire was unable to be advanced proximally so a venogram was performed with 10 cc of Omnipaque, this revealed venous extravasation. The right brachial vein site was abandoned and manual pressure was held until the site was soft with no continued bleeding. The left brachial vein was accessed with a 21 gauge micropuncture needle and multiple guidewires used to advance to the SVC/RA junction. A 5 French dual lumen power-injectable PICC was advanced, however unable to cross proximally to the lower SVC/right atrial junction, the  PICC was left midline for this reason. Fluoroscopy during the procedure and fluoro spot radiograph confirms appropriate catheter position. The catheter aspirated and flushed well and was then covered with a sterile dressing.  Complications: Extravasation of 10 cc of Omnipaque to right brachial vein. Unable to advance left brachial vein PICC to the SVC/RA junction, left brachial PICC was placed midline.  IMPRESSION: Successful left arm midline VENOUS CATHETER placement with ultrasound and fluoroscopic guidance. The catheter is ready for use.  Read By:  Tsosie Billing PA-C   Electronically Signed   By: Aletta Edouard M.D.   On: 09/14/2013 11:51   Ir Fluoro Guide Cv Line Right  09/21/2013   CLINICAL DATA:  Renal failure  EXAM: IR RIGHT FLOURO GUIDE CV LINE; IR ULTRASOUND GUIDANCE VASC ACCESS RIGHT  MEDICATIONS AND MEDICAL HISTORY: None  ANESTHESIA/SEDATION: None  CONTRAST:  None  FLUOROSCOPY TIME:  18 seconds.  PROCEDURE: The procedure, risks, benefits, and alternatives  were explained to the patient. Questions regarding the procedure were encouraged and answered. The patient understands and consents to the procedure.  The right neck was prepped with Betadine in a sterile fashion, and a sterile drape was applied covering the operative field. A sterile gown and sterile gloves were used for the procedure.  Under sonographic guidance, a micropuncture needle was inserted into the right internal jugular vein and removed over a 018 wire which was up sized to a 3 J. the 20 cm temporary dialysis catheter was advanced over the wire to the right atrium. It was flushed and sewn in place.  COMPLICATIONS: None  FINDINGS: Tip of the temporary dialysis catheter is at the right atrium  IMPRESSION: Successful placement of a temporary right internal jugular vein dialysis catheter.   Electronically Signed   By: Maryclare Bean M.D.   On: 09/21/2013 12:15   Ir US Guide Vasc Access Left  09/14/2013   CLINICAL DATA:  Poor venous access,  request for PICC  EXAM: DUAL LUMEN LEFT MIDLINE VENOUS CATHETER PLACEMENT WITH ULTRASOUND AND FLUOROSCOPIC GUIDANCE  ADDITIONAL VENOGRAPHY OF THE RIGHT UPPER EXTREMITY  FLUOROSCOPY TIME:  7 MIN AND 30 SECONDS.  PROCEDURE: The patient's family member was advised of the possible risks and complications and agreed to undergo the procedure. The patient was then brought to the angiographic suite for the procedure.  The right and left arm was prepped with chlorhexidine, draped in the usual sterile fashion using maximum barrier technique (cap and mask, sterile gown, sterile gloves, large sterile sheet, hand hygiene and cutaneous antisepsis) and infiltrated locally with 1% Lidocaine.  Ultrasound demonstrated patency of the right and left brachial vein, and this was documented with an image. Under real-time ultrasound guidance, the right brachial vein was accessed with a 21 gauge micropuncture needle and image documentation was performed. A 0.018 wire was introduced in to the vein. Over this, a dilator introducer was placed, the wire was unable to be advanced proximally so a venogram was performed with 10 cc of Omnipaque, this revealed venous extravasation. The right brachial vein site was abandoned and manual pressure was held until the site was soft with no continued bleeding. The left brachial vein was accessed with a 21 gauge micropuncture needle and multiple guidewires used to advance to the SVC/RA junction. A 5 French dual lumen power-injectable PICC was advanced, however unable to cross proximally to the lower SVC/right atrial junction, the PICC was left midline for this reason. Fluoroscopy during the procedure and fluoro spot radiograph confirms appropriate catheter position. The catheter aspirated and flushed well and was then covered with a sterile dressing.  Complications: Extravasation of 10 cc of Omnipaque to right brachial vein. Unable to advance left brachial vein PICC to the SVC/RA junction, left brachial  PICC was placed midline.  IMPRESSION: Successful left arm midline VENOUS CATHETER placement with ultrasound and fluoroscopic guidance. The catheter is ready for use.  Read By:  Tsosie Billing PA-C   Electronically Signed   By: Aletta Edouard M.D.   On: 09/14/2013 11:51   Ir US Guide Vasc Access Right  09/21/2013   CLINICAL DATA:  Renal failure  EXAM: IR RIGHT FLOURO GUIDE CV LINE; IR ULTRASOUND GUIDANCE VASC ACCESS RIGHT  MEDICATIONS AND MEDICAL HISTORY: None  ANESTHESIA/SEDATION: None  CONTRAST:  None  FLUOROSCOPY TIME:  18 seconds.  PROCEDURE: The procedure, risks, benefits, and alternatives were explained to the patient. Questions regarding the procedure were encouraged and answered. The patient understands and consents to  the procedure.  The right neck was prepped with Betadine in a sterile fashion, and a sterile drape was applied covering the operative field. A sterile gown and sterile gloves were used for the procedure.  Under sonographic guidance, a micropuncture needle was inserted into the right internal jugular vein and removed over a 018 wire which was up sized to a 3 J. the 20 cm temporary dialysis catheter was advanced over the wire to the right atrium. It was flushed and sewn in place.  COMPLICATIONS: None  FINDINGS: Tip of the temporary dialysis catheter is at the right atrium  IMPRESSION: Successful placement of a temporary right internal jugular vein dialysis catheter.   Electronically Signed   By: Maryclare Bean M.D.   On: 09/21/2013 12:15   Dg Chest Port 1 View  09/18/2013   CLINICAL DATA:  Respiratory distress, shortness of breath.  EXAM: PORTABLE CHEST - 1 VIEW  COMPARISON:  09/17/2013  FINDINGS: Prior CABG and valve replacement. Left pass catheterization is in stable position. Cardiomegaly. Left lower lobe opacity, likely atelectasis. No effusions or overt edema.  IMPRESSION: Cardiomegaly.  Left lower lobe atelectasis.   Electronically Signed   By: Rolm Baptise M.D.   On: 09/18/2013 06:27    Dg Chest Port 1 View  09/17/2013   CLINICAL DATA:  Hemodialysis catheter placement  EXAM: PORTABLE CHEST - 1 VIEW  COMPARISON:  DG CHEST 1V PORT dated 09/17/2013; DG CHEST 1V PORT dated 09/13/2013; DG CHEST 1V PORT dated 09/08/2013  FINDINGS: Grossly unchanged enlarged cardiac silhouette and mediastinal contours post median sternotomy and valve replacement. Interval placement of left jugular approach dialysis catheter with tip projected over the superior aspect of the SVC. Lung volumes remain reduced. Pulmonary vasculature remains indistinct. Grossly unchanged perihilar and bibasilar heterogeneous / consolidative opacities, left greater than right. No definite pleural effusion or pneumothorax. Grossly unchanged bones including postsurgical/traumatic deformity of the distal end of the right clavicle.  IMPRESSION: 1. Interval placement of left jugular approach central venous catheter with tip projected over the superior SVC. No pneumothorax. 2. Similar findings of pulmonary edema and perihilar/bibasilar opacities, left greater than right, atelectasis versus infiltrate.   Electronically Signed   By: Sandi Mariscal M.D.   On: 09/17/2013 10:41   Dg Chest Port 1 View  09/17/2013   CLINICAL DATA:  Confirm PICC line placement.  EXAM: PORTABLE CHEST - 1 VIEW  COMPARISON:  09/13/2013  FINDINGS: No PICC line is visualized within the chest or shoulder regions bilaterally.  There is cardiomegaly. Prior median sternotomy and valve replacement. Mild vascular congestion. Left lower lobe airspace opacity again noted, unchanged. No visible effusions.  IMPRESSION: No visible PICC line.  Stable cardiomegaly and vascular congestion.  Stable left lower lobe atelectasis or consolidation/pneumonia.   Electronically Signed   By: Rolm Baptise M.D.   On: 09/17/2013 04:08   Dg Chest Port 1 View  09/13/2013   CLINICAL DATA:  Respiratory distress  EXAM: PORTABLE CHEST - 1 VIEW  COMPARISON:  DG CHEST 1V PORT dated 09/08/2013  FINDINGS: Evidence  of CABG reidentified with moderate enlargement of the cardiac silhouette and central vascular congestion. Retrocardiac opacity persists. No overt alveolar edema or other filling processes otherwise identified. Trace if any left pleural fluid. No acute osseous finding.  IMPRESSION: Cardiomegaly with central vascular congestion and persistent retrocardiac atelectasis or possibly early pneumonia.   Electronically Signed   By: Conchita Paris M.D.   On: 09/13/2013 13:06   Dg Chest [facility]  09/08/2013   CLINICAL DATA:  Chest pain, hypertension  EXAM: PORTABLE CHEST - 1 VIEW  COMPARISON:  September 06, 2013  FINDINGS: The heart size and mediastinal contours are stable. Patient is status post prior CABG. The heart size is enlarged. Patchy consolidation of left lung base is identified. There is mild atelectasis of right lung base. There is no pulmonary edema. There is probable small left pleural effusion. The visualized skeletal structures are stable.  IMPRESSION: Early pneumonia of left lung base with small left pleural effusion.   Electronically Signed   By: Abelardo Diesel M.D.   On: 09/08/2013 23:41   Dg Abd Portable 2v  09/18/2013   CLINICAL DATA:  58 year old male with abdominal distension. Possible small bowel obstruction. Initial encounter.  EXAM: PORTABLE ABDOMEN - 2 VIEW  COMPARISON:  09/17/2013 and earlier.  FINDINGS: Supine and portable cross-table lateral views of the abdomen. Decreased gaseous distension of small bowel. Numerous gas-filled mostly nondilated loops persist. Persistent right colon gas. No pneumoperitoneum identified. Mild motion artifact on both views. Stable visualized osseous structures.  IMPRESSION: Improved bowel gas pattern with decreased gaseous distension of small bowel and persistent colon gas. Pattern could reflect improving small bowel obstruction or ileus.  No free air identified.   Electronically Signed   By: Lars Pinks M.D.   On: 09/18/2013 17:50    CBC  Recent Labs Lab  09/19/13 0455 09/20/13 0555 09/23/13 0339 09/24/13 0050 09/25/13 0452  WBC 18.4* 15.7* 15.3* 12.1* 11.5*  HGB 10.3* 9.4* 9.0* 9.0* 7.9*  HCT 28.4* 26.5* 24.8* 25.5* 22.4*  PLT 214 244 275 255 243  MCV 81.8 81.0 80.8 82.0 82.7  MCH 29.7 28.7 29.3 28.9 29.2  MCHC 36.3* 35.5 36.3* 35.3 35.3  RDW 16.1* 15.9* 16.6* 17.2* 17.3*  LYMPHSABS 0.8  --   --   --   --   MONOABS 0.5  --   --   --   --   EOSABS 0.1  --   --   --   --   BASOSABS 0.0  --   --   --   --     Chemistries   Recent Labs Lab 09/19/13 0455 09/20/13 0555 09/22/13 2010 09/23/13 0339 09/24/13 0050 09/25/13 0452  NA 137 133* 134* 138 135* 137  K 3.6* 3.2* 3.0* 3.5* 3.8 4.0  CL 96 93* 93* 98 93* 97  CO2 22 19 19 23 20 25   GLUCOSE 118* 110* 132* 112* 105* 122*  BUN 64* 71* 55* 31* 41* 20  CREATININE 7.79* 9.00* 9.36* 6.42* 7.51* 5.19*  CALCIUM 7.1* 7.1* 7.5* 7.6* 7.7* 7.8*  MG 2.2  --   --   --   --   --   AST 24 24  --   --  23 22  ALT 22 19  --   --  19 16  ALKPHOS 59 52  --   --  54 50  BILITOT 1.7* 1.1  --   --  0.9 0.6   ------------------------------------------------------------------------------------------------------------------ estimated creatinine clearance is 19.8 ml/min (by C-G formula based on Cr of 5.19). ------------------------------------------------------------------------------------------------------------------ No results found for this basename: HGBA1C,  in the last 72 hours ------------------------------------------------------------------------------------------------------------------  Recent Labs  09/25/13 0452  CHOL 145  HDL 27*  LDLCALC 87  TRIG 156*  CHOLHDL 5.4   ------------------------------------------------------------------------------------------------------------------ No results found for this basename: TSH, T4TOTAL, FREET3, T3FREE, THYROIDAB,  in the last 72  hours ------------------------------------------------------------------------------------------------------------------ No results found for this basename: VITAMINB12, FOLATE, FERRITIN,  TIBC, IRON, RETICCTPCT,  in the last 72 hours  Coagulation profile  Recent Labs Lab 09/21/13 0345 09/22/13 0300 09/23/13 0339 09/24/13 0050 09/25/13 0452  INR 1.51* 1.38 1.34 1.55* 1.43    No results found for this basename: DDIMER,  in the last 72 hours  Cardiac Enzymes No results found for this basename: CK, CKMB, TROPONINI, MYOGLOBIN,  in the last 168 hours ------------------------------------------------------------------------------------------------------------------ No components found with this basename: POCBNP,     Kahliyah Dick D.O. on 09/25/2013 at 8:29 AM  Between 7am to 7pm - Pager - [phone]  After 7pm go to [email or web address] - password [email or web address]  And look for the night coverage person covering for me after hours  [facility] Office  [phone]

## 2013-09-25 NOTE — Progress Notes (Signed)
Son at bedside.  Notified of patient's fall.

## 2013-09-25 NOTE — Progress Notes (Signed)
Subjective:    Just back from perm cath placement. Doing well. Family visiting, no compliants  Objective Filed Vitals:   09/25/13 1110 09/25/13 1114 09/25/13 1118 09/25/13 1120  BP: 144/84 144/84 133/88   Pulse: 99 99 99 97  Temp:      TempSrc:      Resp: 19 18 26 19   Height:      Weight:      SpO2: 97% 95% 95% 95%   Physical Exam General: alert and oriented. In no acute distress.  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 Afebrile. WBC 11.5 3 AMS- gradual improvement  4 HTN/ vol excess- 133/88 cont to lower vol w HD 5 Hyperphosphatemia-  Ca+ 7.8 - added Ca+ bath tomorrow. Phos 8.2 renagel ac  6. Anemia-  hgb 7.9- start ESA 7. Nutrition- Alb 1.8 currently NPO, renal diet when advanced. Multivit. Nepro. Encourage protien 8 Debility- L hemiparesis   Shelle Iron, NP [place] [phone] 09/25/2013,1:15 PM  LOS: 17 days   I have seen and examined patient, discussed with PA and agree with assessment and plan as outlined above with additions as indicated. Kelly Splinter MD pager [phone]    cell [phone] 09/25/2013, 2:31 PM     Additional Objective Labs: Basic Metabolic Panel:  Recent Labs Lab 09/19/13 0455  09/22/13 2010 09/23/13 0339 09/24/13 0050 09/25/13 0452  NA 137  < > 134* 138 135* 137  K 3.6*  < > 3.0* 3.5* 3.8 4.0  CL 96  < > 93* 98 93* 97  CO2 22  < > 19 23 20 25   GLUCOSE 118*  < > 132* 112* 105* 122*  BUN 64*  < > 55* 31* 41* 20  CREATININE 7.79*  < > 9.36* 6.42* 7.51*  5.19*  CALCIUM 7.1*  < > 7.5* 7.6* 7.7* 7.8*  PHOS 6.7*  --  8.2*  --   --   --   < > = values in this interval not displayed. Liver Function Tests:  Recent Labs Lab 09/20/13 0555 09/22/13 2010 09/24/13 0050 09/25/13 0452  AST 24  --  23 22  ALT 19  --  19 16  ALKPHOS 52  --  54 50  BILITOT 1.1  --  0.9 0.6  PROT 5.3*  --  5.9* 5.7*  ALBUMIN 1.3* 1.2* 1.3* 1.4*   No results found for this basename: LIPASE, AMYLASE,  in the last 168 hours CBC:  Recent Labs Lab 09/19/13 0455 09/20/13 0555 09/23/13 0339 09/24/13 0050 09/25/13 0452  WBC 18.4* 15.7* 15.3* 12.1* 11.5*  NEUTROABS 16.9*  --   --   --   --   HGB 10.3* 9.4* 9.0* 9.0* 7.9*  HCT 28.4* 26.5* 24.8* 25.5* 22.4*  MCV 81.8 81.0 80.8 82.0 82.7  PLT 214 244 275 255 243   Blood Culture    Component Value Date/Time   SDES BLOOD LEFT HAND 09/24/2013 0050   SPECREQUEST BOTTLES DRAWN AEROBIC ONLY 4CC 09/24/2013 0050   CULT  Value:        BLOOD  CULTURE RECEIVED NO GROWTH TO DATE CULTURE WILL BE HELD FOR 5 DAYS BEFORE ISSUING A FINAL NEGATIVE REPORT Performed at [facility] 09/24/2013 0050   REPTSTATUS PENDING 09/24/2013 0050    Cardiac Enzymes: No results found for this basename: CKTOTAL, CKMB, CKMBINDEX, TROPONINI,  in the last 168 hours CBG:  Recent Labs Lab 09/24/13 1342 09/24/13 1621 09/24/13 2127 09/25/13 0739 09/25/13 1137  GLUCAP 95 131* 142* 90 99   Iron Studies: No results found for this basename: IRON, TIBC, TRANSFERRIN, FERRITIN,  in the last 72 hours @lablastinr3 @ Studies/Results: No results found. Medications:   . antiseptic oral rinse  15 mL Mouth Rinse q12n4p  . [START ON 09/26/2013] aspirin EC  81 mg Oral Daily  . chlorhexidine  15 mL Mouth Rinse BID  . feeding supplement (RESOURCE BREEZE)  1 Container Oral TID WC  . fentaNYL      . gelatin adsorbable      . heparin      . heparin subcutaneous  5,000 Units Subcutaneous 3 times per day  . insulin aspart  0-9 Units Subcutaneous TID WC   . levothyroxine  200 mcg Oral QAC breakfast  . midazolam      . nafcillin IV  2 g Intravenous Q4H  . pantoprazole  40 mg Oral Q1200  . potassium chloride  20 mEq Oral Daily  . rifampin  600 mg Oral Daily  . sevelamer carbonate  1,600 mg Oral TID WC  . sodium chloride  3 mL Intravenous Q12H

## 2013-09-25 NOTE — Sedation Documentation (Signed)
Patient denies pain and is resting comfortably with eyes closed 

## 2013-09-25 NOTE — Procedures (Signed)
Successful conversion of right jugular temp cath to [facility].  Tip in right atrium and ready to use.  No immediate complication.

## 2013-09-25 NOTE — Progress Notes (Signed)
Patient ID: Bradley [name], male   DOB: 1956/04/13, 58 y.o.   MRN: [id number]         [facility]    Date of Admission:  09/08/2013    Total days of antibiotics 16         Principal Problem:   Sepsis Active Problems:   HYPOTHYROIDISM- TSH 7.5   Aortic valve replaced- [place] propceedure 2011   Hypertension   Depression   LBBB (left bundle branch block)   Major depressive disorder, recurrent, severe with psychotic features- hospitalized in Jan 2013   Generalized anxiety disorder   Altered mental state   Encephalitis   Septic embolism   HCAP (healthcare-associated pneumonia)   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   Acute diastolic heart failure   NSTEMI (non-ST elevated myocardial infarction)   Agitation   Acute respiratory alkalosis   Acute renal failure   Encephalopathy acute   Staphylococcus aureus bacteremia with sepsis   ST elevation (STEMI) myocardial infarction   . antiseptic oral rinse  15 mL Mouth Rinse q12n4p  . [START ON 09/26/2013] aspirin EC  81 mg Oral Daily  . chlorhexidine  15 mL Mouth Rinse BID  . feeding supplement (RESOURCE BREEZE)  1 Container Oral TID WC  . fentaNYL      . gelatin adsorbable      . heparin      . heparin subcutaneous  5,000 Units Subcutaneous 3 times per day  . insulin aspart  0-9 Units Subcutaneous TID WC  . levothyroxine  200 mcg Oral QAC breakfast  . midazolam      . multivitamin  1 tablet Oral QHS  . nafcillin IV  2 g Intravenous Q4H  . pantoprazole  40 mg Oral Q1200  . potassium chloride  20 mEq Oral Daily  . rifampin  600 mg Oral Daily  . sevelamer carbonate  1,600 mg Oral TID WC  . sodium chloride  3 mL Intravenous Q12H   Subjective: " Hello doc, I think I'm doing better".  Objective: Temp:  [98.9 F (37.2 C)-99.9 F (37.7 C)] 99.1 F (37.3 C) (02/26 1329) Pulse Rate:  [93-104] 100 (02/26 1329) Resp:  [14-26] 20 (02/26 1329) BP: (115-145)/(71-93) 116/71 mmHg (02/26 1329) SpO2:  [92 %-99 %] 94 %  (02/26 1329) Weight:  [100.7 kg (222 lb 0.1 oz)] 100.7 kg (222 lb 0.1 oz) (02/25 2126)  General: He is alert and in no distress Lungs: Clear Cor: Regular S1 and S2 with a 2/6 systolic murmur Abdomen: Nontender. No recent diarrhea Neuro: Alert with normal speech and conversation. He is now oriented to place  Lab Results Lab Results  Component Value Date   WBC 11.5* 09/25/2013   HGB 7.9* 09/25/2013   HCT 22.4* 09/25/2013   MCV 82.7 09/25/2013   PLT 243 09/25/2013    Lab Results  Component Value Date   CREATININE 5.19* 09/25/2013   BUN 20 09/25/2013   NA 137 09/25/2013   K 4.0 09/25/2013   CL 97 09/25/2013   CO2 25 09/25/2013    Lab Results  Component Value Date   ALT 16 09/25/2013   AST 22 09/25/2013   ALKPHOS 50 09/25/2013   BILITOT 0.6 09/25/2013      Microbiology: Recent Results (from the past 240 hour(s))  MRSA PCR SCREENING     Status: None   Collection Time    09/15/13  5:31 PM      Result Value Ref Range Status   MRSA by  PCR NEGATIVE  NEGATIVE Final   Comment:            The GeneXpert MRSA Assay (FDA     approved for NASAL specimens     only), is one component of a     comprehensive MRSA colonization     surveillance program. It is not     intended to diagnose MRSA     infection nor to guide or     monitor treatment for     MRSA infections.  CLOSTRIDIUM DIFFICILE BY PCR     Status: None   Collection Time    09/16/13 10:53 AM      Result Value Ref Range Status   C difficile by pcr NEGATIVE  NEGATIVE Final  CULTURE, BLOOD (ROUTINE X 2)     Status: None   Collection Time    09/24/13 12:44 AM      Result Value Ref Range Status   Specimen Description BLOOD RIGHT ARM   Final   Special Requests BOTTLES DRAWN AEROBIC ONLY 6CC   Final   Culture  Setup Time     Final   Value: 09/24/2013 03:21     Performed at [facility]   Culture     Final   Value:        BLOOD CULTURE RECEIVED NO GROWTH TO DATE CULTURE WILL BE HELD FOR 5 DAYS BEFORE ISSUING A FINAL NEGATIVE  REPORT     Performed at [facility]   Report Status PENDING   Incomplete  CULTURE, BLOOD (ROUTINE X 2)     Status: None   Collection Time    09/24/13 12:50 AM      Result Value Ref Range Status   Specimen Description BLOOD LEFT HAND   Final   Special Requests BOTTLES DRAWN AEROBIC ONLY 4CC   Final   Culture  Setup Time     Final   Value: 09/24/2013 03:21     Performed at [facility]   Culture     Final   Value:        BLOOD CULTURE RECEIVED NO GROWTH TO DATE CULTURE WILL BE HELD FOR 5 DAYS BEFORE ISSUING A FINAL NEGATIVE REPORT     Performed at [facility]   Report Status PENDING   Incomplete    Assessment: He has had no further fever since the one spike yesterday and so far repeat blood cultures are negative. His neurologic status has improved and he is less confused.  Plan: 1. Continue nafcillin and rifampin  Michel Bickers, MD [facility] [place] [facility] [phone] pager   [phone] cell 09/25/2013, 2:21 PM

## 2013-09-25 NOTE — Progress Notes (Signed)
Physical Therapy Treatment Patient Details Name: Bradley [name] MRN: [id number] DOB: 04-16-1956 Today's Date: 09/25/2013 Time: [id number] PT Time Calculation (min): 27 min  PT Assessment / Plan / Recommendation  History of Present Illness Pt admit with sepsis and r/o endocarditis.  Anoxic brain injury.   PT Comments   Patient fatigues easily with activity, but able to follow commands better today with decreased agitation.  Patient will need skilled interdisciplinary environment to achieve max rehab potential.  Follow Up Recommendations  CIR;Supervision/Assistance - 24 hour     Does the patient have the potential to tolerate intense rehabilitation   Yes  Barriers to Discharge  None      Equipment Recommendations  Other (comment) (TBA)    Recommendations for Other Services Rehab consult  Frequency Min 3X/week   Progress towards PT Goals Progress towards PT goals: Progressing toward goals  Plan Current plan remains appropriate    Precautions / Restrictions Precautions Precautions: Fall   Pertinent Vitals/Pain C/o sore bottom due to cleaning from diarrhea    Mobility  Bed Mobility Overal bed mobility: Needs Assistance Bed Mobility: Supine to Sit;Rolling Rolling: Min assist Supine to sit: Mod assist General bed mobility comments: pulls up on rails, purposeful effort and seems aware of task (had to roll initially due to soiled with diarrhea in bed) Transfers Equipment used: Standard walker Transfers: Sit to/from Stand Sit to Stand: Mod assist;+2 safety/equipment Stand pivot transfers: Mod assist;+2 safety/equipment General transfer comment: used standard walker as only one available.  Patient incontinent of stool upon standing, but able to lift into standing with 30% help.  patient stepped to turn and sit on 3:1, then to recliner     PT Goals (current goals can now be found in the care plan section)    Visit Information  Last PT Received On: 09/25/13 Assistance Needed:  +2 History of Present Illness: Pt admit with sepsis and r/o endocarditis.  Anoxic brain injury.    Subjective Data      Cognition  Cognition Arousal/Alertness: Awake/alert Behavior During Therapy: Flat affect Overall Cognitive Status: Impaired/Different from baseline Orientation Level: Disoriented to;Time;Place Current Attention Level: Sustained Memory: Decreased short-term memory Following Commands: Follows one step commands with increased time;Follows one step commands inconsistently Safety/Judgement: Decreased awareness of deficits Problem Solving: Slow processing;Requires verbal cues;Requires tactile cues Rancho Levels of Cognitive Functioning Rancho Los Amigos Scales of Cognitive Functioning: Confused/inappropriate/non-agitated    Balance  Balance Overall balance assessment: Needs assistance Sitting-balance support: Bilateral upper extremity supported Sitting balance-Leahy Scale: Good Standing balance-Leahy Scale: Poor Standing balance comment: UE support for safety  End of Session PT - End of Session Equipment Utilized During Treatment: Gait belt Activity Tolerance: Patient limited by fatigue Patient left: in chair;with call bell/phone within reach;with chair alarm set Nurse Communication: Other (comment) (IV beeping)   GP     Bradley [name],[name] 09/25/2013, 4:18 PM [name], [name] 09/25/2013

## 2013-09-25 NOTE — Progress Notes (Signed)
Patient alert and oriented to self only.  Patient impulsive and incontinent with multiple attempts to get out of bed without assistance.  Patient was assisted to the chair with physical therapy.  Yellow socks on patient. Chair alarm on.  Patient reclined in chair with meal tray in front of him.  RN heard chair alarm from hallway and arrived to find patient standing in front of chair.  Patient was unable to sit back in the chair due to leg rest, so RN assisted patient to sit on the floor.  Patient questioned regarding pain, tenderness, dizziness.  Patient denies dizziness, pain, headache, soreness, confusion.  Patient remains oriented to self only.  Vitals obtained and were within normal limits.  Dr. Ree Kida notified via phone at 16:25.  Attempted to call spouse at home, work, and cell without answer.  Patient assisted back to bed using Vertis Kelch.  No redness, bruising, or broken skin areas noted.  No pain during motion.  EKG rhythm remains Sinus Tachycardia.  Patient reoriented and placed on bed alarm.

## 2013-09-26 LAB — CBC
HCT: 23.2 % — ABNORMAL LOW (ref 39.0–52.0)
Hemoglobin: 8.1 g/dL — ABNORMAL LOW (ref 13.0–17.0)
MCH: 29 pg (ref 26.0–34.0)
MCHC: 34.9 g/dL (ref 30.0–36.0)
MCV: 83.2 fL (ref 78.0–100.0)
Platelets: 272 10*3/uL (ref 150–400)
RBC: 2.79 MIL/uL — ABNORMAL LOW (ref 4.22–5.81)
RDW: 17.1 % — ABNORMAL HIGH (ref 11.5–15.5)
WBC: 16.8 10*3/uL — ABNORMAL HIGH (ref 4.0–10.5)

## 2013-09-26 LAB — BASIC METABOLIC PANEL
BUN: 27 mg/dL — ABNORMAL HIGH (ref 6–23)
CO2: 24 mEq/L (ref 19–32)
Calcium: 8.2 mg/dL — ABNORMAL LOW (ref 8.4–10.5)
Chloride: 96 mEq/L (ref 96–112)
Creatinine, Ser: 7.32 mg/dL — ABNORMAL HIGH (ref 0.50–1.35)
GFR calc Af Amer: 9 mL/min — ABNORMAL LOW (ref >90)
GFR calc non Af Amer: 7 mL/min — ABNORMAL LOW (ref >90)
Glucose, Bld: 105 mg/dL — ABNORMAL HIGH (ref 70–99)
Potassium: 4.2 mEq/L (ref 3.7–5.3)
Sodium: 136 mEq/L — ABNORMAL LOW (ref 137–147)

## 2013-09-26 LAB — IRON AND TIBC
Iron: 20 ug/dL — ABNORMAL LOW (ref 42–135)
Saturation Ratios: 23 % (ref 20–55)
TIBC: 86 ug/dL — ABNORMAL LOW (ref 215–435)
UIBC: 66 ug/dL — ABNORMAL LOW (ref 125–400)

## 2013-09-26 LAB — GLUCOSE, CAPILLARY
Glucose-Capillary: 88 mg/dL (ref 70–99)
Glucose-Capillary: 100 mg/dL — ABNORMAL HIGH (ref 70–99)
Glucose-Capillary: 85 mg/dL (ref 70–99)

## 2013-09-26 LAB — PROTIME-INR
INR: 1.29 (ref 0.00–1.49)
Prothrombin Time: 15.8 seconds — ABNORMAL HIGH (ref 11.6–15.2)

## 2013-09-26 MED ORDER — LIDOCAINE-PRILOCAINE 2.5-2.5 % EX CREA
1.0000 "application " | TOPICAL_CREAM | CUTANEOUS | Status: DC | PRN
  Filled 2013-09-26: qty 5

## 2013-09-26 MED ORDER — HEPARIN SODIUM (PORCINE) 1000 UNIT/ML DIALYSIS: 1000.0000 [IU] | INTRAMUSCULAR | Status: DC | PRN

## 2013-09-26 MED ORDER — SODIUM CHLORIDE 0.9 % IV SOLN
100.0000 mL | INTRAVENOUS | Status: DC | PRN
Start: 2013-09-26 — End: 2013-09-26

## 2013-09-26 MED ORDER — SODIUM CHLORIDE 0.9 % IV SOLN: 100.0000 mL | INTRAVENOUS | Status: DC | PRN

## 2013-09-26 MED ORDER — LIDOCAINE HCL (PF) 1 % IJ SOLN: 5.0000 mL | INTRAMUSCULAR | Status: DC | PRN

## 2013-09-26 MED ORDER — DARBEPOETIN ALFA-POLYSORBATE 40 MCG/0.4ML IJ SOLN
40.0000 ug | INTRAMUSCULAR | Status: DC
  Administered 2013-09-26: 40 ug via INTRAVENOUS

## 2013-09-26 MED ORDER — DARBEPOETIN ALFA-POLYSORBATE 40 MCG/0.4ML IJ SOLN
INTRAMUSCULAR | Status: AC
  Filled 2013-09-26: qty 0.4

## 2013-09-26 MED ORDER — DARBEPOETIN ALFA-POLYSORBATE 60 MCG/0.3ML IJ SOLN
60.0000 ug | INTRAMUSCULAR | Status: DC
  Filled 2013-09-26: qty 0.3

## 2013-09-26 MED ORDER — PENTAFLUOROPROP-TETRAFLUOROETH EX AERO: 1.0000 "application " | INHALATION_SPRAY | CUTANEOUS | Status: DC | PRN

## 2013-09-26 MED ORDER — ALTEPLASE 2 MG IJ SOLR
2.0000 mg | Freq: Once | INTRAMUSCULAR | Status: DC | PRN
  Filled 2013-09-26: qty 2

## 2013-09-26 MED ORDER — NEPRO/CARBSTEADY PO LIQD: 237.0000 mL | ORAL | Status: DC | PRN

## 2013-09-26 NOTE — Procedures (Signed)
I have seen and examined patient, discussed with PA and agree with assessment and plan as outlined above. Kelly Splinter MD pager [phone]    cell [phone] 09/26/2013, 1:51 PM

## 2013-09-26 NOTE — Progress Notes (Signed)
[facility] Progress Note  Subjective:   Denies, pain or SOB; appetite good  Objective Filed Vitals:   09/25/13 1956 09/26/13 0629 09/26/13 0737 09/26/13 0950  BP: 130/80 140/74 134/85   Pulse: 100 120 97   Temp: 98.6 F (37 C) 98.6 F (37 C) 98.6 F (37 C)   TempSrc: Oral Oral Oral   Resp: 18 18 18    Height:      Weight:    100.3 kg (221 lb 1.9 oz)  SpO2:  94% 97%    Physical Exam General: NAD seems a little restless; Heart: RRR 2/6 murmur Lungs: no wheezes or rales Abdomen: soft NT Extremities: tr ankle edema Dialysis Access: RIJ perm cath placed 2/26 slight bloodly drainage  Assessment:  1 Acute kidney injury- oliguric ATN from gent vs immune-complex disease from infection; 1st HD 2/18, cont MWF dialysis; has daily KCl 20 ordered - need to watch Tunneled HD cath placed yest by IR, temp cath is out  2 Staph aureus bacteremia / embolic strokes / hx tissue AVR 2011- suspected endocarditis, Initial blood cultures MSSA, recent cultures NGTD on IV nafcillin and rifampin. Afebrile. WBC 11.5 up to 15.8 today  3 AMS- improved 4 HTN/ vol excess- [id number] systolic cont to lower vol w HD Not on any BP meds 5 Hyperphosphatemia- Ca+ 7.8 - corrected Ca 9.9 2.5 bath ordered. Phos 8.2 renagel ac  6. Anemia- hgb 7.9- start Aranesp 60 today - check Fe studies pre HD 7. Nutrition- Alb 1.4 renal diet  Multivit. Nepro. Encourage protien -  8 Debility- L hemiparesis    [facility] Beeper [phone] 09/26/2013,10:07 AM  LOS: 18 days   I have seen and examined patient, discussed with PA and agree with assessment and plan as outlined above with additions as indicated. Kelly Splinter MD pager [phone]    cell [phone] 09/26/2013, 1:31 PM      Additional Objective Labs: Basic Metabolic Panel:  Recent Labs Lab 09/22/13 2010  09/24/13 0050 09/25/13 0452 09/26/13 0404  NA 134*  < > 135* 137 136*  K 3.0*  < > 3.8 4.0 4.2  CL 93*   < > 93* 97 96  CO2 19  < > 20 25 24   GLUCOSE 132*  < > 105* 122* 105*  BUN 55*  < > 41* 20 27*  CREATININE 9.36*  < > 7.51* 5.19* 7.32*  CALCIUM 7.5*  < > 7.7* 7.8* 8.2*  PHOS 8.2*  --   --   --   --   < > = values in this interval not displayed. Liver Function Tests:  Recent Labs Lab 09/20/13 0555 09/22/13 2010 09/24/13 0050 09/25/13 0452  AST 24  --  23 22  ALT 19  --  19 16  ALKPHOS 52  --  54 50  BILITOT 1.1  --  0.9 0.6  PROT 5.3*  --  5.9* 5.7*  ALBUMIN 1.3* 1.2* 1.3* 1.4*  CBC:  Recent Labs Lab 09/20/13 0555 09/23/13 0339 09/24/13 0050 09/25/13 0452 09/26/13 0404  WBC 15.7* 15.3* 12.1* 11.5* 16.8*  HGB 9.4* 9.0* 9.0* 7.9* 8.1*  HCT 26.5* 24.8* 25.5* 22.4* 23.2*  MCV 81.0 80.8 82.0 82.7 83.2  PLT 244 275 255 243 272   Blood Culture    Component Value Date/Time   SDES BLOOD LEFT HAND 09/24/2013 0050   SPECREQUEST BOTTLES DRAWN AEROBIC ONLY 4CC 09/24/2013 0050   CULT  Value:  BLOOD CULTURE RECEIVED NO GROWTH TO DATE CULTURE WILL BE HELD FOR 5 DAYS BEFORE ISSUING A FINAL NEGATIVE REPORT Performed at [facility] 09/24/2013 0050   REPTSTATUS PENDING 09/24/2013 0050  CBG:  Recent Labs Lab 09/25/13 0739 09/25/13 1137 09/25/13 1715 09/25/13 2200 09/26/13 0725  GLUCAP 90 99 96 127* 88  Medications:   . antiseptic oral rinse  15 mL Mouth Rinse q12n4p  . aspirin EC  81 mg Oral Daily  . chlorhexidine  15 mL Mouth Rinse BID  . feeding supplement (RESOURCE BREEZE)  1 Container Oral TID WC  . heparin subcutaneous  5,000 Units Subcutaneous 3 times per day  . insulin aspart  0-9 Units Subcutaneous TID WC  . levothyroxine  200 mcg Oral QAC breakfast  . multivitamin  1 tablet Oral QHS  . nafcillin IV  2 g Intravenous Q4H  . pantoprazole  40 mg Oral Q1200  . potassium chloride  20 mEq Oral Daily  . rifampin  600 mg Oral Daily  . sevelamer carbonate  1,600 mg Oral TID WC  . sodium chloride  3 mL Intravenous Q12H

## 2013-09-26 NOTE — Progress Notes (Signed)
Patient ID: Bradley [name], male   DOB: 10-Apr-1956, 58 y.o.   MRN: [id number]         [facility] [name] for Infectious Disease    Date of Admission:  09/08/2013    Total days of antibiotics 17         Principal Problem:   Sepsis Active Problems:   HYPOTHYROIDISM- TSH 7.5   Aortic valve replaced- [place] propceedure 2011   Hypertension   Depression   LBBB (left bundle branch block)   Major depressive disorder, recurrent, severe with psychotic features- hospitalized in Jan 2013   Generalized anxiety disorder   Altered mental state   Encephalitis   Septic embolism   HCAP (healthcare-associated pneumonia)   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   Acute diastolic heart failure   NSTEMI (non-ST elevated myocardial infarction)   Agitation   Acute respiratory alkalosis   Acute renal failure   Encephalopathy acute   Staphylococcus aureus bacteremia with sepsis   ST elevation (STEMI) myocardial infarction   . antiseptic oral rinse  15 mL Mouth Rinse q12n4p  . aspirin EC  81 mg Oral Daily  . chlorhexidine  15 mL Mouth Rinse BID  . darbepoetin (ARANESP) injection - DIALYSIS  60 mcg Intravenous Q Fri-HD  . feeding supplement (RESOURCE BREEZE)  1 Container Oral TID WC  . heparin subcutaneous  5,000 Units Subcutaneous 3 times per day  . insulin aspart  0-9 Units Subcutaneous TID WC  . levothyroxine  200 mcg Oral QAC breakfast  . multivitamin  1 tablet Oral QHS  . nafcillin IV  2 g Intravenous Q4H  . pantoprazole  40 mg Oral Q1200  . potassium chloride  20 mEq Oral Daily  . rifampin  600 mg Oral Daily  . sevelamer carbonate  1,600 mg Oral TID WC  . sodium chloride  3 mL Intravenous Q12H   Subjective: He indicated that he was exhausted when he tried to get up with a physical therapist.  Objective: Temp:  [98.6 F (37 C)-99.1 F (37.3 C)] 98.6 F (37 C) (02/27 0737) Pulse Rate:  [92-120] 97 (02/27 0737) Resp:  [18-20] 18 (02/27 0737) BP: (116-140)/(71-85) 134/85 mmHg (02/27 0737) SpO2:   [93 %-97 %] 97 % (02/27 0737) Weight:  [100.3 kg (221 lb 1.9 oz)] 100.3 kg (221 lb 1.9 oz) (02/27 0950)  General: He is alert and in no distress Lungs: Clear Cor: Regular S1 and S2 with a 2/6 systolic murmur Abdomen: Nontender.  Neuro: He knows that he is at [name]  Lab Results Lab Results  Component Value Date   WBC 16.8* 09/26/2013   HGB 8.1* 09/26/2013   HCT 23.2* 09/26/2013   MCV 83.2 09/26/2013   PLT 272 09/26/2013    Lab Results  Component Value Date   CREATININE 7.32* 09/26/2013   BUN 27* 09/26/2013   NA 136* 09/26/2013   K 4.2 09/26/2013   CL 96 09/26/2013   CO2 24 09/26/2013    Lab Results  Component Value Date   ALT 16 09/25/2013   AST 22 09/25/2013   ALKPHOS 50 09/25/2013   BILITOT 0.6 09/25/2013      Microbiology: Recent Results (from the past 240 hour(s))  CULTURE, BLOOD (ROUTINE X 2)     Status: None   Collection Time    09/24/13 12:44 AM      Result Value Ref Range Status   Specimen Description BLOOD RIGHT ARM   Final   Special Requests BOTTLES DRAWN AEROBIC ONLY 6CC  Final   Culture  Setup Time     Final   Value: 09/24/2013 03:21     Performed at [name]   Culture     Final   Value:        BLOOD CULTURE RECEIVED NO GROWTH TO DATE CULTURE WILL BE HELD FOR 5 DAYS BEFORE ISSUING A FINAL NEGATIVE REPORT     Performed at [name]   Report Status PENDING   Incomplete  CULTURE, BLOOD (ROUTINE X 2)     Status: None   Collection Time    09/24/13 12:50 AM      Result Value Ref Range Status   Specimen Description BLOOD LEFT HAND   Final   Special Requests BOTTLES DRAWN AEROBIC ONLY 4CC   Final   Culture  Setup Time     Final   Value: 09/24/2013 03:21     Performed at [name]   Culture     Final   Value:        BLOOD CULTURE RECEIVED NO GROWTH TO DATE CULTURE WILL BE HELD FOR 5 DAYS BEFORE ISSUING A FINAL NEGATIVE REPORT     Performed at [name]   Report Status PENDING   Incomplete    Assessment: He has  had no further fever and so far repeat blood cultures are negative. He is less confused.  Plan: 1. Continue nafcillin and rifampin 2. Please call Dr. Carlyle Basques [phone]) for any infectious disease questions this weekend   Michel Bickers, MD Bradley [name] [phone] pager   [phone] cell 09/26/2013, 12:31 PM

## 2013-09-26 NOTE — Progress Notes (Signed)
SLP Cancellation Note Pt in dialysis. Will f/u next week Herbie [place], [place] CCC-SLP [phone]

## 2013-09-26 NOTE — Progress Notes (Signed)
[facility]                                                                              Patient Demographics  Bradley [name], is a 58 y.o. male, DOB - 04-19-1956, [id number]  Admit date - 09/08/2013   Admitting Physician Berle Mull, MD  Outpatient Primary MD for the patient is [name], MD  LOS - 18   Chief Complaint  Patient presents with  . Altered Mental Status      Brief narrative:  58 year old male with history of aortic stenosis, status post pediatric aortic valvulotomy at age 20 followed by tissue AVR (Bentall procedure) 02/2000 with cardiac cath 02/2010 with normal coronaries, chronic LBBB, thyroid cancer post thyroidectomy >> hypothyroidism, HTN, and depression, who had been generally unwell for 2-3 weeks. He was seen in the ED on 09/06/13 and assessed as influenza related vomiting, diarrhea, abdominal pain, body aches, fever and mild confusion and was ultimately discharged on Tamiflu. Patient worsened at home with progressive confusion/disorientation and was admitted with sepsis, septic brain emboli and rule out infective endocarditis.   Assessment & Plan   Severe sepsis secondary to MSSA bacteremia w/ sepsis / infective endocarditis w/ cerebral septic emboli / UTI  -Blood cultures x 2 MSSA - follow up cultures negative, however on antibiotics -Chest x-ray suggested early pneumonia of left lung base with small left pleural effusion  -UA cx with MSSA UTI  -TTE (09/09/13) without vegetation  -Cardiology recommends 6 weeks of antibiotics for presumed endocarditis, no TEE at this time -has prior tissue AVR  -ID directing antibiotics, currently on naficillin and rifampin -Patient continues to have low grade fever, however, leukocytosis trending downward -Cdiff PCR negative on 2/16 and 09/16/13 -Blood cultures from 09/24/13, show no growth to date  Toxic metabolic encephalopathy  -secondary to severe sepsis, azotemia, and septic brain emboli  -MRI Brain  (09/09/13) with multiple bilateral supra tentorial and right cerebellar regions consistent with acute partially hemorrhagic infarcts suspected secondary to septic emboli  -Continue supportive care and treat underlying causes  -Neurology has evaluated  -QTc prolonged so haldol not safe  [id number], folic acid [id number], and ammonia 17   Severe thrombocytopenia  -Resolved, Platelet counts dropped to a nadir of 23  -felt to be due to sepsis   Acute Renal Failure/Acute tubular necrosis  -due to bacteremia/immune complex or gentamicin  -baseline renal fnx: 18 / 0.93 - progressive failure since admit -Transferred to [name] 09/15/2013 for hemodialysis  -Hemodialysis started 09/17/2013  -Renal US normal kidneys, no hydronephrosis -Nephrology directing care   Abdominal distention/diarrhea  -distention improved -diarrhea likely due to antibiotics, which has resolved -C diff negative 2/16 and 2/17   Hypokalemia  -Resolved, will continue to monitor  -Continue K-dur 26mEq daily  NSTEMI (Type II)  -peak Tropinin 2.26  -Cardiology has no plans for cath at this point  -has stable ST elevation r/t LBBB   Acute Systolic HF and Chronic grade 1 Diastolic CHF  -ECHO Oct 123456 with EF 60-65% with severe LVH and no RWMA  -ECHO Sep 09, 2013 EF 45-50% with septal and apical hypokinesis   History of thyroid cancer, post thyroidectomy hypothyroidism  -TSH  elevated at 10.174  -Synthroid increased  -Will need monitoring and repeat labs in 4-6 weeks   Hypertension  -Holding lisinopril due to acute renal failure  -Continue metoprolol IV PRN  Hyperglycemia  -Hemoglobin A1c 5.7  -Continue ISS and CBG monitoring  History of depression  -hold Zoloft secondary to acute encephalopathy.   Hyponatremia  -Resolved, due to volume overload  -corrected after initiation of hemodialysis  Code Status: Full  Family Communication: None at bedside  Disposition Plan: Admitted.  Will consult CIR  Time Spent in  minutes   30 minutes  Procedures/Course  2/09 - admit  2/10 - ID, Heme and Cards consults. TTE EF 45%. No clear vegetations  2/14 - PCCM consult  2/15 - Junctional rhythm with precedex, mental status improved. Transferred to [facility] for possible HD 2/16 - Renal U/S- normal kidneys, no hydronephrosis  2/18 - transferred to ICU for diaphoresis, ST changes and confusion 2/21 - patient pulled out L IJ HD catheter that was placed on 09/17/13 2/21 - L [place] HD catheter placed 2/26 - Conversion of R IJ temp cath to [facility] cath  [place]   Nephrology PCCM Infectious disease Interventional radiology Cardiology Neurology Hematology CIR  DVT Prophylaxis Heparin   Lab Results  Component Value Date   PLT 272 09/26/2013    Medications  Scheduled Meds: . antiseptic oral rinse  15 mL Mouth Rinse q12n4p  . aspirin EC  81 mg Oral Daily  . chlorhexidine  15 mL Mouth Rinse BID  . feeding supplement (RESOURCE BREEZE)  1 Container Oral TID WC  . heparin subcutaneous  5,000 Units Subcutaneous 3 times per day  . insulin aspart  0-9 Units Subcutaneous TID WC  . levothyroxine  200 mcg Oral QAC breakfast  . multivitamin  1 tablet Oral QHS  . nafcillin IV  2 g Intravenous Q4H  . pantoprazole  40 mg Oral Q1200  . potassium chloride  20 mEq Oral Daily  . rifampin  600 mg Oral Daily  . sevelamer carbonate  1,600 mg Oral TID WC  . sodium chloride  3 mL Intravenous Q12H   Continuous Infusions:  PRN Meds:.sodium chloride, sodium chloride, acetaminophen, acetaminophen, feeding supplement (NEPRO CARB STEADY), heparin, heparin, hydrOXYzine, lidocaine (PF), lidocaine-prilocaine, liver oil-zinc oxide, metoprolol, morphine injection, ondansetron (ZOFRAN) IV, pentafluoroprop-tetrafluoroeth, zolpidem  Antibiotics    Anti-infectives   Start     Dose/Rate Route Frequency Ordered Stop   09/24/13 1400  rifampin (RIFADIN) capsule 600 mg     600 mg Oral Daily 09/24/13 1254     09/14/13 1400  rifampin (RIFADIN) 300 mg  in sodium chloride 0.9 % 100 mL IVPB  Status:  Discontinued     300 mg 200 mL/hr over 30 Minutes Intravenous 3 times per day 09/14/13 1210 09/24/13 1254   09/11/13 1800  gentamicin (GARAMYCIN) 90 mg in dextrose 5 % 50 mL IVPB  Status:  Discontinued     90 mg 104.5 mL/hr over 30 Minutes Intravenous Every 12 hours 09/11/13 0932 09/13/13 0802   09/09/13 1800  nafcillin 2 g in dextrose 5 % 50 mL IVPB     2 g 100 mL/hr over 30 Minutes Intravenous Every 4 hours 09/09/13 1415     09/09/13 1430  nafcillin 2 g in dextrose 5 % 50 mL IVPB     2 g 100 mL/hr over 30 Minutes Intravenous  Once 09/09/13 1415 09/09/13 1603   09/09/13 1415  nafcillin injection 2 g  Status:  Discontinued  2 g Intravenous Every 4 hours 09/09/13 1410 09/09/13 1413   09/09/13 0800  vancomycin (VANCOCIN) IVPB 1000 mg/200 mL premix  Status:  Discontinued     1,000 mg 200 mL/hr over 60 Minutes Intravenous Every 8 hours 09/09/13 0341 09/11/13 0829   09/09/13 0500  gentamicin (GARAMYCIN) 90 mg in dextrose 5 % 50 mL IVPB  Status:  Discontinued     1 mg/kg  90.7 kg 104.5 mL/hr over 30 Minutes Intravenous 3 times per day 09/09/13 0340 09/11/13 0932   09/09/13 0400  ceFEPIme (MAXIPIME) 2 g in dextrose 5 % 50 mL IVPB  Status:  Discontinued     2 g 100 mL/hr over 30 Minutes Intravenous 3 times per day 09/09/13 0340 09/09/13 1410   09/08/13 2315  piperacillin-tazobactam (ZOSYN) IVPB 3.375 g     3.375 g 100 mL/hr over 30 Minutes Intravenous  Once 09/08/13 2307 09/09/13 0043   09/08/13 2315  vancomycin (VANCOCIN) IVPB 1000 mg/200 mL premix     1,000 mg 200 mL/hr over 60 Minutes Intravenous  Once 09/08/13 2307 09/09/13 0230        Subjective:   Donald Siva seen and examined today.  Patient is oriented to self.  He does not know where he is.  He is very pleasant.  Denies any pain, and has no complaints.  Objective:   Filed Vitals:   09/25/13 1717 09/25/13 1956 09/26/13 0629 09/26/13 0737  BP: 132/79 130/80 140/74 134/85    Pulse: 92 100 120 97  Temp: 98.9 F (37.2 C) 98.6 F (37 C) 98.6 F (37 C) 98.6 F (37 C)  TempSrc: Oral Oral Oral Oral  Resp: 18 18 18 18   Height:      Weight:      SpO2: 93%  94% 97%    Wt Readings from Last 3 Encounters:  09/24/13 100.7 kg (222 lb 0.1 oz)  09/06/13 90.719 kg (200 lb)  05/07/13 94.348 kg (208 lb)     Intake/Output Summary (Last 24 hours) at 09/26/13 0837 Last data filed at 09/25/13 0953  Gross per 24 hour  Intake      0 ml  Output      0 ml  Net      0 ml    Exam  General: Well developed, well nourished, NAD, appears stated age  HEENT: NCAT, PERRLA, EOMI, Anicteic Sclera, mucous membranes moist.   Neck: Supple, no JVD, no masses  Cardiovascular: S1 S2 auscultated, 2/6 SEM, Regular rate and rhythm.  Respiratory: Clear to auscultation bilaterally with equal chest rise  Abdomen: Soft, nontender, nondistended, + bowel sounds  Extremities: warm dry without cyanosis clubbing. Trace edema  Neuro: Awake and alert, however, not oriented. Has left hemineglect.  Skin: Without rashes exudates or nodules  Data Review   Micro Results Recent Results (from the past 240 hour(s))  CLOSTRIDIUM DIFFICILE BY PCR     Status: None   Collection Time    09/16/13 10:53 AM      Result Value Ref Range Status   C difficile by pcr NEGATIVE  NEGATIVE Final  CULTURE, BLOOD (ROUTINE X 2)     Status: None   Collection Time    09/24/13 12:44 AM      Result Value Ref Range Status   Specimen Description BLOOD RIGHT ARM   Final   Special Requests BOTTLES DRAWN AEROBIC ONLY [facility]   Final   Culture  Setup Time     Final   Value: 09/24/2013 03:21  Performed at [facility]     Final   Value:        BLOOD CULTURE RECEIVED NO GROWTH TO DATE CULTURE WILL BE HELD FOR 5 DAYS BEFORE ISSUING A FINAL NEGATIVE REPORT     Performed at [facility]   Report Status PENDING   Incomplete  CULTURE, BLOOD (ROUTINE X 2)     Status: None   Collection Time     09/24/13 12:50 AM      Result Value Ref Range Status   Specimen Description BLOOD LEFT HAND   Final   Special Requests BOTTLES DRAWN AEROBIC ONLY [facility]   Final   Culture  Setup Time     Final   Value: 09/24/2013 03:21     Performed at [facility]   Culture     Final   Value:        BLOOD CULTURE RECEIVED NO GROWTH TO DATE CULTURE WILL BE HELD FOR 5 DAYS BEFORE ISSUING A FINAL NEGATIVE REPORT     Performed at [facility]   Report Status PENDING   Incomplete    Radiology Reports Dg Chest 2 View  09/06/2013   CLINICAL DATA:  Fever, nausea, vomiting.  EXAM: CHEST  2 VIEW  COMPARISON:  08/17/2011  FINDINGS: Prior median sternotomy and valve replacement. Mild cardiomegaly and vascular congestion. Low lung volumes with bibasilar atelectasis. No effusions. No acute bony abnormality.  IMPRESSION: Cardiomegaly, bibasilar atelectasis.   Electronically Signed   By: Rolm Baptise M.D.   On: 09/06/2013 19:34   Dg Abd 1 View  09/17/2013   CLINICAL DATA:  Abdominal distension  EXAM: ABDOMEN - 1 VIEW  COMPARISON:  CT ABD/PELVIS W CM dated 09/06/2013  FINDINGS: There is at least moderate gas distention of multiple loops of predominantly small bowel, though there may be minimal gaseous distention of the splenic flexure of the colon.  Examination is degraded secondary to the coned field-of-view on solitary provided radiograph.  Nondiagnostic evaluation for pneumoperitoneum secondary supine positioning and exclusion of the lower thorax. No definite pneumatosis or portal venous gas.  No definite abnormal intra-abdominal calcifications.  Degenerative change the lower lumbar spine.  IMPRESSION: Degraded examination with findings concerning for small bowel obstruction though there may be a minimal amount of air within the splenic flexure of the colon. Clinical correlation is advised. Further evaluation with complete abdominal radiographic series may be performed as clinically indicated.   Electronically  Signed   By: Sandi Mariscal M.D.   On: 09/17/2013 10:38   Ct Head Wo Contrast  09/12/2013   CLINICAL DATA:  Increased mental status change, evaluate for CVA versus worsening bleed.  EXAM: CT HEAD WITHOUT CONTRAST  TECHNIQUE: Contiguous axial images were obtained from the base of the skull through the vertex without intravenous contrast.  COMPARISON:  Prior MRI and and CT from 09/09/2013.  FINDINGS: Study is degraded by motion artifact.  Vasogenic edema as trending linear hyperdensity within the left parietal lobe is stable not significantly changed relative to the previous examination. Vague hypodensities involving the supratentorial brain and right cerebellar hemisphere are not significantly changed relative to recent MRI. No definite a new intracranial infarct identified. Previously seen hyperdense foci within the mid bilateral frontal lobes are less conspicuous as compared to prior. No new intracranial hemorrhage. No mass lesion or midline shift. Ventricles are stable in size without evidence of hydrocephalus. No extra-axial fluid collection.  The calvarium is grossly intact. Orbits are normal.  Paranasal sinuses and mastoid air cells remain clear.  IMPRESSION: 1. Limited study due to motion. No significant interval change in subacute left parietal infarct with small amount of associated hemorrhage. Additional multi focal infarcts involving the supratentorial brain and right cerebellar hemisphere are not significantly changed as well relative to recent MRI from 09/09/2013. No definite new intracranial infarct identified. No new intracranial hemorrhage.   Electronically Signed   By: Jeannine Boga M.D.   On: 09/12/2013 22:02   Ct Head Wo Contrast  09/09/2013   CLINICAL DATA:  Fever.  Altered mental status.  EXAM: CT HEAD WITHOUT CONTRAST  TECHNIQUE: Contiguous axial images were obtained from the base of the skull through the vertex without intravenous contrast.  COMPARISON:  CT HEAD W/O CM dated 09/06/2013;  DG CHEST 1V PORT dated 09/08/2013; CT ABD/PELVIS W CM dated 09/06/2013  FINDINGS: Small focus of linear high attenuation in the left parietal lobe appear similar to the prior exam. The amount of edema at has increased compared to the prior exam suggesting a subacute process. This appears wedge-shaped in may represent an embolic infarct. There is a similar faint area of high attenuation in the left frontal lobe on image number 22 low with a more subtle area in the right frontal lobe that is more equivocal. The dural venous sinuses appear within normal limits. No mass lesion or midline shift. No hydrocephalus.  The calvarium is intact. Left-sided nasal septal spur. Mastoid air cells clear. Paranasal sinuses are normal.  IMPRESSION: 1. Increasing edema around linear high attenuation in the left parietal lobe. Followup MRI with and without contrast is recommended for further assessment. The patient presents with an infectious syndrome and septic emboli are in the differential considerations. Infarct with a small amount of hemorrhage or cortical laminar necrosis as well as developmental venous anomaly remain in the differential considerations. MRI should be useful in further characterization. 2. Dr. Leonel Ramsay called discuss the case at 0230 hr on the day of examination.   Electronically Signed   By: Dereck Ligas M.D.   On: 09/09/2013 02:32   Ct Head Wo Contrast  09/06/2013   CLINICAL DATA:  Confusion.  EXAM: CT HEAD WITHOUT CONTRAST  TECHNIQUE: Contiguous axial images were obtained from the base of the skull through the vertex without intravenous contrast.  COMPARISON:  None.  FINDINGS: There is a 1.3 cm curvilinear focus of hyperdensity in the subcortical left parietal lobe (image 25). There is no evidence of surrounding edema. There is no other evidence of acute intracranial hemorrhage. There is no evidence of acute cortical infarct, mass, midline shift, or extra-axial fluid collection. Subcentimeter low density  focus in the left caudate is suggestive of a remote lacunar infarct. Ventricles and sulci are normal. There is very mild mucosal thickening in the frontal sinuses and ethmoid air cells bilaterally. Mastoid air cells are clear. Orbits are unremarkable.  IMPRESSION: 1. Curvilinear hyperdensity in the subcortical left parietal lobe. Acute intracranial hemorrhage cannot be completely excluded, however there is no surrounding edema or evidence of hemorrhage elsewhere in the brain and the patient is not presenting in the setting of acute trauma. Given the curvilinear configuration, this may be vascular and represent an incidental developmental venous anomaly. 2. Remote left caudate lacunar infarct.   Electronically Signed   By: Logan Bores   On: 09/06/2013 22:29   Mr Brain W Wo Contrast  09/09/2013   CLINICAL DATA:  Post aortic valve replacement 2011. Presenting with altered mental status, elevated white count  and abnormal CT.  EXAM: MRI HEAD WITHOUT AND WITH CONTRAST  TECHNIQUE: Multiplanar, multiecho pulse sequences of the brain and surrounding structures were obtained without and with intravenous contrast.  CONTRAST:  76mL MULTIHANCE GADOBENATE DIMEGLUMINE 529 MG/ML IV SOLN  COMPARISON:  09/09/2013 and 09/06/2013 CT.  No comparison MR.  FINDINGS: Exam is motion degraded.  Multiple bilateral supra tentorial and right cerebellar regions of restricted motion some of which are partially hemorrhagic with largest area left parietal lobe. In the present clinical setting, these findings are consistent with acute partially hemorrhagic infarcts and possibly related to septic emboli.  At most there is very minimal enhancement of the left parietal partially hemorrhagic infarct.  No intracranial mass lesion seen separate from the above described findings.  Major intracranial vascular structures are patent.  IMPRESSION: Exam is motion degraded.  Multiple bilateral supra tentorial and right cerebellar regions of restricted motion  some of which are partially hemorrhagic with largest area left parietal lobe. In the present clinical setting, these findings are consistent with acute partially hemorrhagic infarcts and possibly related to septic emboli.  These results were called by telephone at the time of interpretation on 09/09/2013 at 11:54 AM to Dr. Algis Liming , who verbally acknowledged these results.   Electronically Signed   By: Chauncey Cruel M.D.   On: 09/09/2013 12:03   Ct Abdomen Pelvis W Contrast  09/06/2013   CLINICAL DATA:  58 year old male abdominal pain, vomiting, diarrhea and low grade fever.  EXAM: CT ABDOMEN AND PELVIS WITH CONTRAST  TECHNIQUE: Multidetector CT imaging of the abdomen and pelvis was performed using the standard protocol following bolus administration of intravenous contrast.  CONTRAST:  100 cc intravenous Omnipaque 300  COMPARISON:  None.  FINDINGS: Cardiomegaly and aortic valve replacement noted. Mild bibasilar atelectasis/scarring is identified.  The liver, spleen, pancreas, gallbladder adrenal glands and kidneys are unremarkable except for small bilateral renal cysts.  There is no evidence of free fluid, enlarged lymph nodes, biliary dilation or abdominal aortic aneurysm.  The bowel, bladder and appendix are unremarkable. There is no evidence of bowel obstruction, abscess or pneumoperitoneum.  A small right inguinal hernia containing fat is identified.  Mild prostate enlargement is present.  No acute or suspicious bony abnormalities are identified.  IMPRESSION: No evidence of acute abnormality.  Cardiomegaly, aortic valve replacement, prostate enlargement and small right inguinal hernias containing fat.   Electronically Signed   By: Hassan Rowan M.D.   On: 09/06/2013 22:32   Ir Veno/ext/uni Right  09/14/2013   CLINICAL DATA:  Poor venous access, request for PICC  EXAM: DUAL LUMEN LEFT MIDLINE VENOUS CATHETER PLACEMENT WITH ULTRASOUND AND FLUOROSCOPIC GUIDANCE  ADDITIONAL VENOGRAPHY OF THE RIGHT UPPER EXTREMITY   FLUOROSCOPY TIME:  7 MIN AND 30 SECONDS.  PROCEDURE: The patient's family member was advised of the possible risks and complications and agreed to undergo the procedure. The patient was then brought to the angiographic suite for the procedure.  The right and left arm was prepped with chlorhexidine, draped in the usual sterile fashion using maximum barrier technique (cap and mask, sterile gown, sterile gloves, large sterile sheet, hand hygiene and cutaneous antisepsis) and infiltrated locally with 1% Lidocaine.  Ultrasound demonstrated patency of the right and left brachial vein, and this was documented with an image. Under real-time ultrasound guidance, the right brachial vein was accessed with a 21 gauge micropuncture needle and image documentation was performed. A 0.018 wire was introduced in to the vein. Over this, a dilator introducer was  placed, the wire was unable to be advanced proximally so a venogram was performed with 10 cc of Omnipaque, this revealed venous extravasation. The right brachial vein site was abandoned and manual pressure was held until the site was soft with no continued bleeding. The left brachial vein was accessed with a 21 gauge micropuncture needle and multiple guidewires used to advance to the SVC/RA junction. A 5 French dual lumen power-injectable PICC was advanced, however unable to cross proximally to the lower SVC/right atrial junction, the PICC was left midline for this reason. Fluoroscopy during the procedure and fluoro spot radiograph confirms appropriate catheter position. The catheter aspirated and flushed well and was then covered with a sterile dressing.  Complications: Extravasation of 10 cc of Omnipaque to right brachial vein. Unable to advance left brachial vein PICC to the SVC/RA junction, left brachial PICC was placed midline.  IMPRESSION: Successful left arm midline VENOUS CATHETER placement with ultrasound and fluoroscopic guidance. The catheter is ready for use.  Read  By:  Tsosie Billing PA-C   Electronically Signed   By: Aletta Edouard M.D.   On: 09/14/2013 11:51   US Renal  09/15/2013   CLINICAL DATA:  Acute renal failure.  EXAM: RENAL/URINARY TRACT ULTRASOUND COMPLETE  COMPARISON:  CT abdomen and pelvis 09/06/2013.  FINDINGS: Right Kidney:  Length: Approximately 12.7 cm. No hydronephrosis. Well-preserved cortex. No shadowing calculi. Normal parenchymal echotexture without focal abnormalities.  Left Kidney:  Length: Approximately 12.5 cm. No hydronephrosis. Well-preserved cortex. No shadowing calculi. Normal parenchymal echotexture without focal abnormalities.  Bladder:  Decompressed by Foley catheter.  IMPRESSION: Normal kidneys.  Specifically, no evidence of hydronephrosis.   Electronically Signed   By: Evangeline Dakin M.D.   On: 09/15/2013 10:55   Ir Fluoro Guide Cv Line Left  09/14/2013   CLINICAL DATA:  Poor venous access, request for PICC  EXAM: DUAL LUMEN LEFT MIDLINE VENOUS CATHETER PLACEMENT WITH ULTRASOUND AND FLUOROSCOPIC GUIDANCE  ADDITIONAL VENOGRAPHY OF THE RIGHT UPPER EXTREMITY  FLUOROSCOPY TIME:  7 MIN AND 30 SECONDS.  PROCEDURE: The patient's family member was advised of the possible risks and complications and agreed to undergo the procedure. The patient was then brought to the angiographic suite for the procedure.  The right and left arm was prepped with chlorhexidine, draped in the usual sterile fashion using maximum barrier technique (cap and mask, sterile gown, sterile gloves, large sterile sheet, hand hygiene and cutaneous antisepsis) and infiltrated locally with 1% Lidocaine.  Ultrasound demonstrated patency of the right and left brachial vein, and this was documented with an image. Under real-time ultrasound guidance, the right brachial vein was accessed with a 21 gauge micropuncture needle and image documentation was performed. A 0.018 wire was introduced in to the vein. Over this, a dilator introducer was placed, the wire was unable to be  advanced proximally so a venogram was performed with 10 cc of Omnipaque, this revealed venous extravasation. The right brachial vein site was abandoned and manual pressure was held until the site was soft with no continued bleeding. The left brachial vein was accessed with a 21 gauge micropuncture needle and multiple guidewires used to advance to the SVC/RA junction. A 5 French dual lumen power-injectable PICC was advanced, however unable to cross proximally to the lower SVC/right atrial junction, the PICC was left midline for this reason. Fluoroscopy during the procedure and fluoro spot radiograph confirms appropriate catheter position. The catheter aspirated and flushed well and was then covered with a sterile dressing.  Complications: Extravasation  of 10 cc of Omnipaque to right brachial vein. Unable to advance left brachial vein PICC to the SVC/RA junction, left brachial PICC was placed midline.  IMPRESSION: Successful left arm midline VENOUS CATHETER placement with ultrasound and fluoroscopic guidance. The catheter is ready for use.  Read By:  Tsosie Billing PA-C   Electronically Signed   By: Aletta Edouard M.D.   On: 09/14/2013 11:51   Ir Fluoro Guide Cv Line Right  09/21/2013   CLINICAL DATA:  Renal failure  EXAM: IR RIGHT FLOURO GUIDE CV LINE; IR ULTRASOUND GUIDANCE VASC ACCESS RIGHT  MEDICATIONS AND MEDICAL HISTORY: None  ANESTHESIA/SEDATION: None  CONTRAST:  None  FLUOROSCOPY TIME:  18 seconds.  PROCEDURE: The procedure, risks, benefits, and alternatives were explained to the patient. Questions regarding the procedure were encouraged and answered. The patient understands and consents to the procedure.  The right neck was prepped with Betadine in a sterile fashion, and a sterile drape was applied covering the operative field. A sterile gown and sterile gloves were used for the procedure.  Under sonographic guidance, a micropuncture needle was inserted into the right internal jugular vein and removed over  a 018 wire which was up sized to a 3 J. the 20 cm temporary dialysis catheter was advanced over the wire to the right atrium. It was flushed and sewn in place.  COMPLICATIONS: None  FINDINGS: Tip of the temporary dialysis catheter is at the right atrium  IMPRESSION: Successful placement of a temporary right internal jugular vein dialysis catheter.   Electronically Signed   By: Maryclare Bean M.D.   On: 09/21/2013 12:15   Ir US Guide Vasc Access Left  09/14/2013   CLINICAL DATA:  Poor venous access, request for PICC  EXAM: DUAL LUMEN LEFT MIDLINE VENOUS CATHETER PLACEMENT WITH ULTRASOUND AND FLUOROSCOPIC GUIDANCE  ADDITIONAL VENOGRAPHY OF THE RIGHT UPPER EXTREMITY  FLUOROSCOPY TIME:  7 MIN AND 30 SECONDS.  PROCEDURE: The patient's family member was advised of the possible risks and complications and agreed to undergo the procedure. The patient was then brought to the angiographic suite for the procedure.  The right and left arm was prepped with chlorhexidine, draped in the usual sterile fashion using maximum barrier technique (cap and mask, sterile gown, sterile gloves, large sterile sheet, hand hygiene and cutaneous antisepsis) and infiltrated locally with 1% Lidocaine.  Ultrasound demonstrated patency of the right and left brachial vein, and this was documented with an image. Under real-time ultrasound guidance, the right brachial vein was accessed with a 21 gauge micropuncture needle and image documentation was performed. A 0.018 wire was introduced in to the vein. Over this, a dilator introducer was placed, the wire was unable to be advanced proximally so a venogram was performed with 10 cc of Omnipaque, this revealed venous extravasation. The right brachial vein site was abandoned and manual pressure was held until the site was soft with no continued bleeding. The left brachial vein was accessed with a 21 gauge micropuncture needle and multiple guidewires used to advance to the SVC/RA junction. A 5 French dual  lumen power-injectable PICC was advanced, however unable to cross proximally to the lower SVC/right atrial junction, the PICC was left midline for this reason. Fluoroscopy during the procedure and fluoro spot radiograph confirms appropriate catheter position. The catheter aspirated and flushed well and was then covered with a sterile dressing.  Complications: Extravasation of 10 cc of Omnipaque to right brachial vein. Unable to advance left brachial vein PICC to the SVC/RA  junction, left brachial PICC was placed midline.  IMPRESSION: Successful left arm midline VENOUS CATHETER placement with ultrasound and fluoroscopic guidance. The catheter is ready for use.  Read By:  Tsosie Billing PA-C   Electronically Signed   By: Aletta Edouard M.D.   On: 09/14/2013 11:51   Ir US Guide Vasc Access Right  09/21/2013   CLINICAL DATA:  Renal failure  EXAM: IR RIGHT FLOURO GUIDE CV LINE; IR ULTRASOUND GUIDANCE VASC ACCESS RIGHT  MEDICATIONS AND MEDICAL HISTORY: None  ANESTHESIA/SEDATION: None  CONTRAST:  None  FLUOROSCOPY TIME:  18 seconds.  PROCEDURE: The procedure, risks, benefits, and alternatives were explained to the patient. Questions regarding the procedure were encouraged and answered. The patient understands and consents to the procedure.  The right neck was prepped with Betadine in a sterile fashion, and a sterile drape was applied covering the operative field. A sterile gown and sterile gloves were used for the procedure.  Under sonographic guidance, a micropuncture needle was inserted into the right internal jugular vein and removed over a 018 wire which was up sized to a 3 J. the 20 cm temporary dialysis catheter was advanced over the wire to the right atrium. It was flushed and sewn in place.  COMPLICATIONS: None  FINDINGS: Tip of the temporary dialysis catheter is at the right atrium  IMPRESSION: Successful placement of a temporary right internal jugular vein dialysis catheter.   Electronically Signed   By: Maryclare Bean M.D.   On: 09/21/2013 12:15   Dg Chest Port 1 View  09/18/2013   CLINICAL DATA:  Respiratory distress, shortness of breath.  EXAM: PORTABLE CHEST - 1 VIEW  COMPARISON:  09/17/2013  FINDINGS: Prior CABG and valve replacement. Left pass catheterization is in stable position. Cardiomegaly. Left lower lobe opacity, likely atelectasis. No effusions or overt edema.  IMPRESSION: Cardiomegaly.  Left lower lobe atelectasis.   Electronically Signed   By: Rolm Baptise M.D.   On: 09/18/2013 06:27   Dg Chest Port 1 View  09/17/2013   CLINICAL DATA:  Hemodialysis catheter placement  EXAM: PORTABLE CHEST - 1 VIEW  COMPARISON:  DG CHEST 1V PORT dated 09/17/2013; DG CHEST 1V PORT dated 09/13/2013; DG CHEST 1V PORT dated 09/08/2013  FINDINGS: Grossly unchanged enlarged cardiac silhouette and mediastinal contours post median sternotomy and valve replacement. Interval placement of left jugular approach dialysis catheter with tip projected over the superior aspect of the SVC. Lung volumes remain reduced. Pulmonary vasculature remains indistinct. Grossly unchanged perihilar and bibasilar heterogeneous / consolidative opacities, left greater than right. No definite pleural effusion or pneumothorax. Grossly unchanged bones including postsurgical/traumatic deformity of the distal end of the right clavicle.  IMPRESSION: 1. Interval placement of left jugular approach central venous catheter with tip projected over the superior SVC. No pneumothorax. 2. Similar findings of pulmonary edema and perihilar/bibasilar opacities, left greater than right, atelectasis versus infiltrate.   Electronically Signed   By: Sandi Mariscal M.D.   On: 09/17/2013 10:41   Dg Chest Port 1 View  09/17/2013   CLINICAL DATA:  Confirm PICC line placement.  EXAM: PORTABLE CHEST - 1 VIEW  COMPARISON:  09/13/2013  FINDINGS: No PICC line is visualized within the chest or shoulder regions bilaterally.  There is cardiomegaly. Prior median sternotomy and valve  replacement. Mild vascular congestion. Left lower lobe airspace opacity again noted, unchanged. No visible effusions.  IMPRESSION: No visible PICC line.  Stable cardiomegaly and vascular congestion.  Stable left lower lobe atelectasis or consolidation/pneumonia.  Electronically Signed   By: Rolm Baptise M.D.   On: 09/17/2013 04:08   Dg Chest Port 1 View  09/13/2013   CLINICAL DATA:  Respiratory distress  EXAM: PORTABLE CHEST - 1 VIEW  COMPARISON:  DG CHEST 1V PORT dated 09/08/2013  FINDINGS: Evidence of CABG reidentified with moderate enlargement of the cardiac silhouette and central vascular congestion. Retrocardiac opacity persists. No overt alveolar edema or other filling processes otherwise identified. Trace if any left pleural fluid. No acute osseous finding.  IMPRESSION: Cardiomegaly with central vascular congestion and persistent retrocardiac atelectasis or possibly early pneumonia.   Electronically Signed   By: Conchita Paris M.D.   On: 09/13/2013 13:06   Dg Chest Port 1 View  09/08/2013   CLINICAL DATA:  Chest pain, hypertension  EXAM: PORTABLE CHEST - 1 VIEW  COMPARISON:  September 06, 2013  FINDINGS: The heart size and mediastinal contours are stable. Patient is status post prior CABG. The heart size is enlarged. Patchy consolidation of left lung base is identified. There is mild atelectasis of right lung base. There is no pulmonary edema. There is probable small left pleural effusion. The visualized skeletal structures are stable.  IMPRESSION: Early pneumonia of left lung base with small left pleural effusion.   Electronically Signed   By: Abelardo Diesel M.D.   On: 09/08/2013 23:41   Dg Abd Portable 2v  09/18/2013   CLINICAL DATA:  58 year old male with abdominal distension. Possible small bowel obstruction. Initial encounter.  EXAM: PORTABLE ABDOMEN - 2 VIEW  COMPARISON:  09/17/2013 and earlier.  FINDINGS: Supine and portable cross-table lateral views of the abdomen. Decreased gaseous distension of  small bowel. Numerous gas-filled mostly nondilated loops persist. Persistent right colon gas. No pneumoperitoneum identified. Mild motion artifact on both views. Stable visualized osseous structures.  IMPRESSION: Improved bowel gas pattern with decreased gaseous distension of small bowel and persistent colon gas. Pattern could reflect improving small bowel obstruction or ileus.  No free air identified.   Electronically Signed   By: Lars Pinks M.D.   On: 09/18/2013 17:50    CBC  Recent Labs Lab 09/20/13 0555 09/23/13 0339 09/24/13 0050 09/25/13 0452 09/26/13 0404  WBC 15.7* 15.3* 12.1* 11.5* 16.8*  HGB 9.4* 9.0* 9.0* 7.9* 8.1*  HCT 26.5* 24.8* 25.5* 22.4* 23.2*  PLT 244 275 255 243 272  MCV 81.0 80.8 82.0 82.7 83.2  MCH 28.7 29.3 28.9 29.2 29.0  MCHC 35.5 36.3* 35.3 35.3 34.9  RDW 15.9* 16.6* 17.2* 17.3* 17.1*    Chemistries   Recent Labs Lab 09/20/13 0555 09/22/13 2010 09/23/13 0339 09/24/13 0050 09/25/13 0452 09/26/13 0404  NA 133* 134* 138 135* 137 136*  K 3.2* 3.0* 3.5* 3.8 4.0 4.2  CL 93* 93* 98 93* 97 96  CO2 19 19 23 20 25 24   GLUCOSE 110* 132* 112* 105* 122* 105*  BUN 71* 55* 31* 41* 20 27*  CREATININE 9.00* 9.36* 6.42* 7.51* 5.19* 7.32*  CALCIUM 7.1* 7.5* 7.6* 7.7* 7.8* 8.2*  AST 24  --   --  23 22  --   ALT 19  --   --  19 16  --   ALKPHOS 52  --   --  54 50  --   BILITOT 1.1  --   --  0.9 0.6  --    ------------------------------------------------------------------------------------------------------------------ estimated creatinine clearance is 14.1 ml/min (by C-G formula based on Cr of 7.32). ------------------------------------------------------------------------------------------------------------------ No results found for this basename: HGBA1C,  in the last  72 hours ------------------------------------------------------------------------------------------------------------------  Recent Labs  09/25/13 0452  CHOL 145  HDL 27*  LDLCALC 87  TRIG  156*  CHOLHDL 5.4   ------------------------------------------------------------------------------------------------------------------ No results found for this basename: TSH, T4TOTAL, FREET3, T3FREE, THYROIDAB,  in the last 72 hours ------------------------------------------------------------------------------------------------------------------ No results found for this basename: VITAMINB12, FOLATE, FERRITIN, TIBC, IRON, RETICCTPCT,  in the last 72 hours  Coagulation profile  Recent Labs Lab 09/22/13 0300 09/23/13 0339 09/24/13 0050 09/25/13 0452 09/26/13 0404  INR 1.38 1.34 1.55* 1.43 1.29    No results found for this basename: DDIMER,  in the last 72 hours  Cardiac Enzymes No results found for this basename: CK, CKMB, TROPONINI, MYOGLOBIN,  in the last 168 hours ------------------------------------------------------------------------------------------------------------------ No components found with this basename: POCBNP,     Alix Stowers D.O. on 09/26/2013 at 8:37 AM  Between 7am to 7pm - Pager - [phone]  After 7pm go to [email or web address] - password [email or web address]  And look for the night coverage person covering for me after hours  [facility] Office  [phone]

## 2013-09-27 DIAGNOSIS — I5031 Acute diastolic (congestive) heart failure: Secondary | ICD-10-CM

## 2013-09-27 DIAGNOSIS — F411 Generalized anxiety disorder: Secondary | ICD-10-CM

## 2013-09-27 LAB — CBC
HCT: 25.6 % — ABNORMAL LOW (ref 39.0–52.0)
Hemoglobin: 8.9 g/dL — ABNORMAL LOW (ref 13.0–17.0)
MCH: 29.1 pg (ref 26.0–34.0)
MCHC: 34.8 g/dL (ref 30.0–36.0)
MCV: 83.7 fL (ref 78.0–100.0)
Platelets: 274 10*3/uL (ref 150–400)
RBC: 3.06 MIL/uL — ABNORMAL LOW (ref 4.22–5.81)
RDW: 17.4 % — ABNORMAL HIGH (ref 11.5–15.5)
WBC: 16.1 10*3/uL — ABNORMAL HIGH (ref 4.0–10.5)

## 2013-09-27 LAB — BASIC METABOLIC PANEL
BUN: 15 mg/dL (ref 6–23)
CO2: 28 mEq/L (ref 19–32)
Calcium: 8.6 mg/dL (ref 8.4–10.5)
Chloride: 97 mEq/L (ref 96–112)
Creatinine, Ser: 4.71 mg/dL — ABNORMAL HIGH (ref 0.50–1.35)
GFR calc Af Amer: 15 mL/min — ABNORMAL LOW (ref >90)
GFR calc non Af Amer: 13 mL/min — ABNORMAL LOW (ref >90)
Glucose, Bld: 111 mg/dL — ABNORMAL HIGH (ref 70–99)
Potassium: 3.5 mEq/L — ABNORMAL LOW (ref 3.7–5.3)
Sodium: 140 mEq/L (ref 137–147)

## 2013-09-27 LAB — PROTIME-INR
INR: 1.36 (ref 0.00–1.49)
Prothrombin Time: 16.4 seconds — ABNORMAL HIGH (ref 11.6–15.2)

## 2013-09-27 LAB — GLUCOSE, CAPILLARY
Glucose-Capillary: 108 mg/dL — ABNORMAL HIGH (ref 70–99)
Glucose-Capillary: 108 mg/dL — ABNORMAL HIGH (ref 70–99)
Glucose-Capillary: 131 mg/dL — ABNORMAL HIGH (ref 70–99)
Glucose-Capillary: 137 mg/dL — ABNORMAL HIGH (ref 70–99)
Glucose-Capillary: 87 mg/dL (ref 70–99)

## 2013-09-27 MED ORDER — SODIUM CHLORIDE 0.9 % IV SOLN
125.0000 mg | INTRAVENOUS | Status: DC
  Administered 2013-09-29 – 2013-10-06 (×2): 125 mg via INTRAVENOUS
  Filled 2013-09-27 (×4): qty 10

## 2013-09-27 NOTE — Progress Notes (Signed)
Subjective:   Doing well, just tired. Denies pain  Objective Filed Vitals:   09/26/13 1848 09/26/13 2057 09/27/13 0541 09/27/13 0954  BP: 126/74 123/88 116/66 118/84  Pulse: 109 104 106 106  Temp: 99.3 F (37.4 C) 98.9 F (37.2 C) 98.6 F (37 C) 100.2 F (37.9 C)  TempSrc: Oral Oral Oral Oral  Resp: 20 18 18    Height:      Weight:  96.3 kg (212 lb 4.9 oz)    SpO2: 97% 95% 98%    Physical Exam General:  Alert and interactive. Answers questions appropriately. No acute distress. 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 Tmax 100.2. WBC 11.5 up to 16.1 today  3 AMS- improved  4 HTN/ vol excess- 118/84 cont to lower vol w HD. No BP meds 5 Hyperphosphatemia- Ca+ 8.6- Phos 8.2 renagel ac  6. Anemia- hgb 8.9- start Aranesp 60 today - Tsat 23- start low dose Iron next HD 7. Nutrition- Alb 1.4 renal diet Multivit. Nepro. Encourage protien  8 Debility- L hemiparesis    Shelle Iron, NP [facility] [phone] 09/27/2013,11:53 AM  LOS: 19 days   Pt seen, examined and agree w A/P as above. No signs of renal recovery as of yet. He has been essentially anuric since 2/15, first HD was 2/18.  Still a chance of recovery, but also the chance he will not recover adn will require maint HD. Have d/w family.  Cont HD MWF for now.   Kelly Splinter MD pager [phone]    cell [phone] 09/27/2013, 1:52 PM    Additional Objective Labs: Basic  Metabolic Panel:  Recent Labs Lab 09/22/13 2010  09/25/13 0452 09/26/13 0404 09/27/13 0505  NA 134*  < > 137 136* 140  K 3.0*  < > 4.0 4.2 3.5*  CL 93*  < > 97 96 97  CO2 19  < > 25 24 28   GLUCOSE 132*  < > 122* 105* 111*  BUN 55*  < > 20 27* 15  CREATININE 9.36*  < > 5.19* 7.32* 4.71*  CALCIUM 7.5*  < > 7.8* 8.2* 8.6  PHOS 8.2*  --   --   --   --   < > = values in this interval not displayed. Liver Function Tests:  Recent Labs Lab 09/22/13 2010 09/24/13 0050 09/25/13 0452  AST  --  23 22  ALT  --  19 16  ALKPHOS  --  54 50  BILITOT  --  0.9 0.6  PROT  --  5.9* 5.7*  ALBUMIN 1.2* 1.3* 1.4*   No results found for this basename: LIPASE, AMYLASE,  in the last 168 hours CBC:  Recent Labs Lab 09/23/13 0339 09/24/13 0050 09/25/13 0452 09/26/13 0404 09/27/13 0505  WBC 15.3* 12.1* 11.5* 16.8* 16.1*  HGB 9.0* 9.0* 7.9* 8.1* 8.9*  HCT 24.8* 25.5* 22.4* 23.2* 25.6*  MCV 80.8 82.0 82.7 83.2 83.7  PLT 275 255 243 272 274  Blood Culture    Component Value Date/Time   SDES BLOOD LEFT HAND 09/24/2013 0050   SPECREQUEST BOTTLES DRAWN AEROBIC ONLY 4CC 09/24/2013 0050   CULT  Value:        BLOOD CULTURE RECEIVED NO GROWTH TO DATE CULTURE WILL BE HELD FOR 5 DAYS BEFORE ISSUING A FINAL NEGATIVE REPORT Performed at [facility] 09/24/2013 0050   REPTSTATUS PENDING 09/24/2013 0050    Cardiac Enzymes: No results found for this basename: CKTOTAL, CKMB, CKMBINDEX, TROPONINI,  in the last 168 hours CBG:  Recent Labs Lab 09/26/13 1157 09/26/13 1903 09/26/13 2127 09/27/13 0841 09/27/13 1121  GLUCAP 100* 85 137* 87 108*   Iron Studies:  Recent Labs  09/26/13 1359  IRON 20*  TIBC 86*   @[email or web address] @ Studies/Results: No results found. Medications:   . antiseptic oral rinse  15 mL Mouth Rinse q12n4p  . aspirin EC  81 mg Oral Daily  . chlorhexidine  15 mL Mouth Rinse BID  . feeding supplement (RESOURCE BREEZE)  1 Container Oral TID WC  . heparin subcutaneous   5,000 Units Subcutaneous 3 times per day  . insulin aspart  0-9 Units Subcutaneous TID WC  . levothyroxine  200 mcg Oral QAC breakfast  . multivitamin  1 tablet Oral QHS  . nafcillin IV  2 g Intravenous Q4H  . pantoprazole  40 mg Oral Q1200  . potassium chloride  20 mEq Oral Daily  . rifampin  600 mg Oral Daily  . sevelamer carbonate  1,600 mg Oral TID WC  . sodium chloride  3 mL Intravenous Q12H

## 2013-09-27 NOTE — Progress Notes (Signed)
[facility]                                                                              Patient Demographics  Bradley [name], is a 58 y.o. male, DOB - [birth year], [id number]  Admit date - 09/08/2013   Admitting Physician Berle Mull, MD  Outpatient Primary MD for the patient is [name], MD  LOS - 58   Chief Complaint  Patient presents with  . Altered Mental Status      Brief narrative:  58 year old male with history of aortic stenosis, status post pediatric aortic valvulotomy at age 72 followed by tissue AVR (Bentall procedure) 02/2000 with cardiac cath 02/2010 with normal coronaries, chronic LBBB, thyroid cancer post thyroidectomy >> hypothyroidism, HTN, and depression, who had been generally unwell for 2-3 weeks. He was seen in the ED on 09/06/13 and assessed as influenza related vomiting, diarrhea, abdominal pain, body aches, fever and mild confusion and was ultimately discharged on Tamiflu. Patient worsened at home with progressive confusion/disorientation and was admitted with sepsis, septic brain emboli and rule out infective endocarditis.   Assessment & Plan   Severe sepsis secondary to MSSA bacteremia w/ sepsis / infective endocarditis w/ cerebral septic emboli / UTI  -Blood cultures x 2 MSSA - follow up cultures negative, however on antibiotics -Chest x-ray suggested early pneumonia of left lung base with small left pleural effusion  -UA cx with MSSA UTI  -TTE (09/09/13) without vegetation  -Cardiology recommends 6 weeks of antibiotics for presumed endocarditis, no TEE at this time -has prior tissue AVR  -ID directing antibiotics, currently on naficillin and rifampin -Patient continues to have low grade fever, however, leukocytosis trending downward -Cdiff PCR negative on 2/16 and 09/16/13 -Blood cultures from 09/24/13, show no growth to date  Toxic metabolic encephalopathy  -secondary to severe sepsis, azotemia, and septic brain emboli  -MRI Brain  (09/09/13) with multiple bilateral supra tentorial and right cerebellar regions consistent with acute partially hemorrhagic infarcts suspected secondary to septic emboli  -Continue supportive care and treat underlying causes  -Neurology has evaluated  -QTc prolonged so haldol not safe  -[id number], folic acid [id number], and ammonia 17   Severe thrombocytopenia  -Resolved, Platelet counts dropped to a nadir of 23  -felt to be due to sepsis   Acute Renal Failure/Acute tubular necrosis  -due to bacteremia/immune complex or gentamicin  -baseline renal fnx: 18 / 0.93 -progressive failure since admit -Transferred to [name] 09/15/2013 for hemodialysis  -Hemodialysis started 09/17/2013  -Renal US normal kidneys, no hydronephrosis -Nephrology directing care   Abdominal distention/diarrhea  -distention improved -diarrhea likely due to antibiotics, which has resolved -C diff negative 2/16 and 2/17   Hypokalemia  -K3.5, will continue replacement with K-dur 41mEq daily andl continue to monitor   NSTEMI (Type II)  -peak Tropinin 2.26  -Cardiology has no plans for cath at this point  -has stable ST elevation r/t LBBB   Acute Systolic HF and Chronic grade 1 Diastolic CHF  -ECHO Oct 123456 with EF 60-65% with severe LVH and no RWMA  -ECHO Sep 09, 2013 EF 45-50% with septal and apical hypokinesis   History of thyroid cancer, post thyroidectomy  hypothyroidism  -TSH elevated at 10.174  -Synthroid increased  -Will need monitoring and repeat labs in 4-6 weeks   Hypertension  -Holding lisinopril due to acute renal failure  -Continue metoprolol IV PRN  Hyperglycemia  -Hemoglobin A1c 5.7  -Continue ISS and CBG monitoring  History of depression  -hold Zoloft secondary to acute encephalopathy.   Hyponatremia  -Resolved, due to volume overload  -corrected after initiation of hemodialysis  Deconditioning -CIR consulted, and pending evaluation  Code Status: Full  Family Communication: None at  bedside  Disposition Plan: Admitted.  Pending CIR evaluation  Time Spent in minutes   20 minutes  Procedures/Course  2/09 - admit  2/10 - ID, Heme and Cards consults. TTE EF 45%. No clear vegetations  2/14 - PCCM consult  2/15 - Junctional rhythm with precedex, mental status improved. Transferred to [facility] for possible HD 2/16 - Renal U/S- normal kidneys, no hydronephrosis  2/18 - transferred to ICU for diaphoresis, ST changes and confusion 2/21 - patient pulled out L IJ HD catheter that was placed on 09/17/13 2/21 - L [place] HD catheter placed 2/26 - Conversion of R IJ temp cath to [facility] cath  [place]   Nephrology PCCM Infectious disease Interventional radiology Cardiology Neurology Hematology CIR  DVT Prophylaxis Heparin   Lab Results  Component Value Date   PLT 274 09/27/2013    Medications  Scheduled Meds: . antiseptic oral rinse  15 mL Mouth Rinse q12n4p  . aspirin EC  81 mg Oral Daily  . chlorhexidine  15 mL Mouth Rinse BID  . feeding supplement (RESOURCE BREEZE)  1 Container Oral TID WC  . heparin subcutaneous  5,000 Units Subcutaneous 3 times per day  . insulin aspart  0-9 Units Subcutaneous TID WC  . levothyroxine  200 mcg Oral QAC breakfast  . multivitamin  1 tablet Oral QHS  . nafcillin IV  2 g Intravenous Q4H  . pantoprazole  40 mg Oral Q1200  . potassium chloride  20 mEq Oral Daily  . rifampin  600 mg Oral Daily  . sevelamer carbonate  1,600 mg Oral TID WC  . sodium chloride  3 mL Intravenous Q12H   Continuous Infusions:  PRN Meds:.acetaminophen, acetaminophen, heparin, hydrOXYzine, liver oil-zinc oxide, metoprolol, morphine injection, ondansetron (ZOFRAN) IV, zolpidem  Antibiotics    Anti-infectives   Start     Dose/Rate Route Frequency Ordered Stop   09/24/13 1400  rifampin (RIFADIN) capsule 600 mg     600 mg Oral Daily 09/24/13 1254     09/14/13 1400  rifampin (RIFADIN) 300 mg in sodium chloride 0.9 % 100 mL IVPB  Status:  Discontinued     300  mg 200 mL/hr over 30 Minutes Intravenous 3 times per day 09/14/13 1210 09/24/13 1254   09/11/13 1800  gentamicin (GARAMYCIN) 90 mg in dextrose 5 % 50 mL IVPB  Status:  Discontinued     90 mg 104.5 mL/hr over 30 Minutes Intravenous Every 12 hours 09/11/13 0932 09/13/13 0802   09/09/13 1800  nafcillin 2 g in dextrose 5 % 50 mL IVPB     2 g 100 mL/hr over 30 Minutes Intravenous Every 4 hours 09/09/13 1415     09/09/13 1430  nafcillin 2 g in dextrose 5 % 50 mL IVPB     2 g 100 mL/hr over 30 Minutes Intravenous  Once 09/09/13 1415 09/09/13 1603   09/09/13 1415  nafcillin injection 2 g  Status:  Discontinued     2 g Intravenous Every  4 hours 09/09/13 1410 09/09/13 1413   09/09/13 0800  vancomycin (VANCOCIN) IVPB 1000 mg/200 mL premix  Status:  Discontinued     1,000 mg 200 mL/hr over 60 Minutes Intravenous Every 8 hours 09/09/13 0341 09/11/13 0829   09/09/13 0500  gentamicin (GARAMYCIN) 90 mg in dextrose 5 % 50 mL IVPB  Status:  Discontinued     1 mg/kg  90.7 kg 104.5 mL/hr over 30 Minutes Intravenous 3 times per day 09/09/13 0340 09/11/13 0932   09/09/13 0400  ceFEPIme (MAXIPIME) 2 g in dextrose 5 % 50 mL IVPB  Status:  Discontinued     2 g 100 mL/hr over 30 Minutes Intravenous 3 times per day 09/09/13 0340 09/09/13 1410   09/08/13 2315  piperacillin-tazobactam (ZOSYN) IVPB 3.375 g     3.375 g 100 mL/hr over 30 Minutes Intravenous  Once 09/08/13 2307 09/09/13 0043   09/08/13 2315  vancomycin (VANCOCIN) IVPB 1000 mg/200 mL premix     1,000 mg 200 mL/hr over 60 Minutes Intravenous  Once 09/08/13 2307 09/09/13 0230        Subjective:   Bradley [name] seen and examined today.  Patient is oriented to self and place.  He currently has no complaints.    Objective:   Filed Vitals:   09/26/13 1823 09/26/13 1848 09/26/13 2057 09/27/13 0541  BP: 143/100 126/74 123/88 116/66  Pulse: 104 109 104 106  Temp: 98.4 F (36.9 C) 99.3 F (37.4 C) 98.9 F (37.2 C) 98.6 F (37 C)  TempSrc: Oral  Oral Oral Oral  Resp: 20 20 18 18   Height:      Weight: 95.5 kg (210 lb 8.6 oz)  96.3 kg (212 lb 4.9 oz)   SpO2: 95% 97% 95% 98%    Wt Readings from Last 3 Encounters:  09/26/13 96.3 kg (212 lb 4.9 oz)  09/06/13 90.719 kg (200 lb)  05/07/13 94.348 kg (208 lb)     Intake/Output Summary (Last 24 hours) at 09/27/13 0901 Last data filed at 09/27/13 0700  Gross per 24 hour  Intake    677 ml  Output   3950 ml  Net  -3273 ml    Exam  General: Well developed, well nourished, NAD, appears stated age  65: NCAT, mucous membranes moist.   Neck: Supple, no JVD, no masses  Cardiovascular: S1 S2 auscultated, 2/6 SEM, Regular rate and rhythm.  Respiratory: Clear to auscultation bilaterally with equal chest rise  Abdomen: Soft, nontender, nondistended, + bowel sounds  Extremities: warm dry without cyanosis clubbing.  Neuro: Awake and alert, however, not oriented. Has left hemineglect.  Skin: Without rashes exudates or nodules  Data Review   Micro Results Recent Results (from the past 240 hour(s))  CULTURE, BLOOD (ROUTINE X 2)     Status: None   Collection Time    09/24/13 12:44 AM      Result Value Ref Range Status   Specimen Description BLOOD RIGHT ARM   Final   Special Requests BOTTLES DRAWN AEROBIC ONLY 6CC   Final   Culture  Setup Time     Final   Value: 09/24/2013 03:21     Performed at [facility]   Culture     Final   Value:        BLOOD CULTURE RECEIVED NO GROWTH TO DATE CULTURE WILL BE HELD FOR 5 DAYS BEFORE ISSUING A FINAL NEGATIVE REPORT     Performed at [facility]   Report Status PENDING  Incomplete  CULTURE, BLOOD (ROUTINE X 2)     Status: None   Collection Time    09/24/13 12:50 AM      Result Value Ref Range Status   Specimen Description BLOOD LEFT HAND   Final   Special Requests BOTTLES DRAWN AEROBIC ONLY 4CC   Final   Culture  Setup Time     Final   Value: 09/24/2013 03:21     Performed at [facility]   Culture      Final   Value:        BLOOD CULTURE RECEIVED NO GROWTH TO DATE CULTURE WILL BE HELD FOR 5 DAYS BEFORE ISSUING A FINAL NEGATIVE REPORT     Performed at [facility]   Report Status PENDING   Incomplete    Radiology Reports Dg Chest 2 View  09/06/2013   CLINICAL DATA:  Fever, nausea, vomiting.  EXAM: CHEST  2 VIEW  COMPARISON:  08/17/2011  FINDINGS: Prior median sternotomy and valve replacement. Mild cardiomegaly and vascular congestion. Low lung volumes with bibasilar atelectasis. No effusions. No acute bony abnormality.  IMPRESSION: Cardiomegaly, bibasilar atelectasis.   Electronically Signed   By: Rolm Baptise M.D.   On: 09/06/2013 19:34   Dg Abd 1 View  09/17/2013   CLINICAL DATA:  Abdominal distension  EXAM: ABDOMEN - 1 VIEW  COMPARISON:  CT ABD/PELVIS W CM dated 09/06/2013  FINDINGS: There is at least moderate gas distention of multiple loops of predominantly small bowel, though there may be minimal gaseous distention of the splenic flexure of the colon.  Examination is degraded secondary to the coned field-of-view on solitary provided radiograph.  Nondiagnostic evaluation for pneumoperitoneum secondary supine positioning and exclusion of the lower thorax. No definite pneumatosis or portal venous gas.  No definite abnormal intra-abdominal calcifications.  Degenerative change the lower lumbar spine.  IMPRESSION: Degraded examination with findings concerning for small bowel obstruction though there may be a minimal amount of air within the splenic flexure of the colon. Clinical correlation is advised. Further evaluation with complete abdominal radiographic series may be performed as clinically indicated.   Electronically Signed   By: Sandi Mariscal M.D.   On: 09/17/2013 10:38   Ct Head Wo Contrast  09/12/2013   CLINICAL DATA:  Increased mental status change, evaluate for CVA versus worsening bleed.  EXAM: CT HEAD WITHOUT CONTRAST  TECHNIQUE: Contiguous axial images were obtained from the base of  the skull through the vertex without intravenous contrast.  COMPARISON:  Prior MRI and and CT from 09/09/2013.  FINDINGS: Study is degraded by motion artifact.  Vasogenic edema as trending linear hyperdensity within the left parietal lobe is stable not significantly changed relative to the previous examination. Vague hypodensities involving the supratentorial brain and right cerebellar hemisphere are not significantly changed relative to recent MRI. No definite a new intracranial infarct identified. Previously seen hyperdense foci within the mid bilateral frontal lobes are less conspicuous as compared to prior. No new intracranial hemorrhage. No mass lesion or midline shift. Ventricles are stable in size without evidence of hydrocephalus. No extra-axial fluid collection.  The calvarium is grossly intact. Orbits are normal. Paranasal sinuses and mastoid air cells remain clear.  IMPRESSION: 1. Limited study due to motion. No significant interval change in subacute left parietal infarct with small amount of associated hemorrhage. Additional multi focal infarcts involving the supratentorial brain and right cerebellar hemisphere are not significantly changed as well relative to recent MRI from 09/09/2013. No definite new intracranial  infarct identified. No new intracranial hemorrhage.   Electronically Signed   By: Jeannine Boga M.D.   On: 09/12/2013 22:02   Ct Head Wo Contrast  09/09/2013   CLINICAL DATA:  Fever.  Altered mental status.  EXAM: CT HEAD WITHOUT CONTRAST  TECHNIQUE: Contiguous axial images were obtained from the base of the skull through the vertex without intravenous contrast.  COMPARISON:  CT HEAD W/O CM dated 09/06/2013; DG CHEST 1V PORT dated 09/08/2013; CT ABD/PELVIS W CM dated 09/06/2013  FINDINGS: Small focus of linear high attenuation in the left parietal lobe appear similar to the prior exam. The amount of edema at has increased compared to the prior exam suggesting a subacute process. This  appears wedge-shaped in may represent an embolic infarct. There is a similar faint area of high attenuation in the left frontal lobe on image number 22 low with a more subtle area in the right frontal lobe that is more equivocal. The dural venous sinuses appear within normal limits. No mass lesion or midline shift. No hydrocephalus.  The calvarium is intact. Left-sided nasal septal spur. Mastoid air cells clear. Paranasal sinuses are normal.  IMPRESSION: 1. Increasing edema around linear high attenuation in the left parietal lobe. Followup MRI with and without contrast is recommended for further assessment. The patient presents with an infectious syndrome and septic emboli are in the differential considerations. Infarct with a small amount of hemorrhage or cortical laminar necrosis as well as developmental venous anomaly remain in the differential considerations. MRI should be useful in further characterization. 2. Dr. Leonel Ramsay called discuss the case at 0230 hr on the day of examination.   Electronically Signed   By: Dereck Ligas M.D.   On: 09/09/2013 02:32   Ct Head Wo Contrast  09/06/2013   CLINICAL DATA:  Confusion.  EXAM: CT HEAD WITHOUT CONTRAST  TECHNIQUE: Contiguous axial images were obtained from the base of the skull through the vertex without intravenous contrast.  COMPARISON:  None.  FINDINGS: There is a 1.3 cm curvilinear focus of hyperdensity in the subcortical left parietal lobe (image 25). There is no evidence of surrounding edema. There is no other evidence of acute intracranial hemorrhage. There is no evidence of acute cortical infarct, mass, midline shift, or extra-axial fluid collection. Subcentimeter low density focus in the left caudate is suggestive of a remote lacunar infarct. Ventricles and sulci are normal. There is very mild mucosal thickening in the frontal sinuses and ethmoid air cells bilaterally. Mastoid air cells are clear. Orbits are unremarkable.  IMPRESSION: 1. Curvilinear  hyperdensity in the subcortical left parietal lobe. Acute intracranial hemorrhage cannot be completely excluded, however there is no surrounding edema or evidence of hemorrhage elsewhere in the brain and the patient is not presenting in the setting of acute trauma. Given the curvilinear configuration, this may be vascular and represent an incidental developmental venous anomaly. 2. Remote left caudate lacunar infarct.   Electronically Signed   By: Logan Bores   On: 09/06/2013 22:29   Mr Brain W Wo Contrast  09/09/2013   CLINICAL DATA:  Post aortic valve replacement 2011. Presenting with altered mental status, elevated white count and abnormal CT.  EXAM: MRI HEAD WITHOUT AND WITH CONTRAST  TECHNIQUE: Multiplanar, multiecho pulse sequences of the brain and surrounding structures were obtained without and with intravenous contrast.  CONTRAST:  4mL MULTIHANCE GADOBENATE DIMEGLUMINE 529 MG/ML IV SOLN  COMPARISON:  09/09/2013 and 09/06/2013 CT.  No comparison MR.  FINDINGS: Exam is motion degraded.  Multiple bilateral supra tentorial and right cerebellar regions of restricted motion some of which are partially hemorrhagic with largest area left parietal lobe. In the present clinical setting, these findings are consistent with acute partially hemorrhagic infarcts and possibly related to septic emboli.  At most there is very minimal enhancement of the left parietal partially hemorrhagic infarct.  No intracranial mass lesion seen separate from the above described findings.  Major intracranial vascular structures are patent.  IMPRESSION: Exam is motion degraded.  Multiple bilateral supra tentorial and right cerebellar regions of restricted motion some of which are partially hemorrhagic with largest area left parietal lobe. In the present clinical setting, these findings are consistent with acute partially hemorrhagic infarcts and possibly related to septic emboli.  These results were called by telephone at the time of  interpretation on 09/09/2013 at 11:54 AM to Dr. Algis Liming , who verbally acknowledged these results.   Electronically Signed   By: Chauncey Cruel M.D.   On: 09/09/2013 12:03   Ct Abdomen Pelvis W Contrast  09/06/2013   CLINICAL DATA:  57 year old male abdominal pain, vomiting, diarrhea and low grade fever.  EXAM: CT ABDOMEN AND PELVIS WITH CONTRAST  TECHNIQUE: Multidetector CT imaging of the abdomen and pelvis was performed using the standard protocol following bolus administration of intravenous contrast.  CONTRAST:  100 cc intravenous Omnipaque 300  COMPARISON:  None.  FINDINGS: Cardiomegaly and aortic valve replacement noted. Mild bibasilar atelectasis/scarring is identified.  The liver, spleen, pancreas, gallbladder adrenal glands and kidneys are unremarkable except for small bilateral renal cysts.  There is no evidence of free fluid, enlarged lymph nodes, biliary dilation or abdominal aortic aneurysm.  The bowel, bladder and appendix are unremarkable. There is no evidence of bowel obstruction, abscess or pneumoperitoneum.  A small right inguinal hernia containing fat is identified.  Mild prostate enlargement is present.  No acute or suspicious bony abnormalities are identified.  IMPRESSION: No evidence of acute abnormality.  Cardiomegaly, aortic valve replacement, prostate enlargement and small right inguinal hernias containing fat.   Electronically Signed   By: Hassan Rowan M.D.   On: 09/06/2013 22:32   Ir Veno/ext/uni Right  09/14/2013   CLINICAL DATA:  Poor venous access, request for PICC  EXAM: DUAL LUMEN LEFT MIDLINE VENOUS CATHETER PLACEMENT WITH ULTRASOUND AND FLUOROSCOPIC GUIDANCE  ADDITIONAL VENOGRAPHY OF THE RIGHT UPPER EXTREMITY  FLUOROSCOPY TIME:  7 MIN AND 30 SECONDS.  PROCEDURE: The patient's family member was advised of the possible risks and complications and agreed to undergo the procedure. The patient was then brought to the angiographic suite for the procedure.  The right and left arm was  prepped with chlorhexidine, draped in the usual sterile fashion using maximum barrier technique (cap and mask, sterile gown, sterile gloves, large sterile sheet, hand hygiene and cutaneous antisepsis) and infiltrated locally with 1% Lidocaine.  Ultrasound demonstrated patency of the right and left brachial vein, and this was documented with an image. Under real-time ultrasound guidance, the right brachial vein was accessed with a 21 gauge micropuncture needle and image documentation was performed. A 0.018 wire was introduced in to the vein. Over this, a dilator introducer was placed, the wire was unable to be advanced proximally so a venogram was performed with 10 cc of Omnipaque, this revealed venous extravasation. The right brachial vein site was abandoned and manual pressure was held until the site was soft with no continued bleeding. The left brachial vein was accessed with a 21 gauge micropuncture needle and multiple guidewires  used to advance to the SVC/RA junction. A 5 French dual lumen power-injectable PICC was advanced, however unable to cross proximally to the lower SVC/right atrial junction, the PICC was left midline for this reason. Fluoroscopy during the procedure and fluoro spot radiograph confirms appropriate catheter position. The catheter aspirated and flushed well and was then covered with a sterile dressing.  Complications: Extravasation of 10 cc of Omnipaque to right brachial vein. Unable to advance left brachial vein PICC to the SVC/RA junction, left brachial PICC was placed midline.  IMPRESSION: Successful left arm midline VENOUS CATHETER placement with ultrasound and fluoroscopic guidance. The catheter is ready for use.  Read By:  Tsosie Billing PA-C   Electronically Signed   By: Aletta Edouard M.D.   On: 09/14/2013 11:51   US Renal  09/15/2013   CLINICAL DATA:  Acute renal failure.  EXAM: RENAL/URINARY TRACT ULTRASOUND COMPLETE  COMPARISON:  CT abdomen and pelvis 09/06/2013.  FINDINGS:  Right Kidney:  Length: Approximately 12.7 cm. No hydronephrosis. Well-preserved cortex. No shadowing calculi. Normal parenchymal echotexture without focal abnormalities.  Left Kidney:  Length: Approximately 12.5 cm. No hydronephrosis. Well-preserved cortex. No shadowing calculi. Normal parenchymal echotexture without focal abnormalities.  Bladder:  Decompressed by Foley catheter.  IMPRESSION: Normal kidneys.  Specifically, no evidence of hydronephrosis.   Electronically Signed   By: Evangeline Dakin M.D.   On: 09/15/2013 10:55   Ir Fluoro Guide Cv Line Left  09/14/2013   CLINICAL DATA:  Poor venous access, request for PICC  EXAM: DUAL LUMEN LEFT MIDLINE VENOUS CATHETER PLACEMENT WITH ULTRASOUND AND FLUOROSCOPIC GUIDANCE  ADDITIONAL VENOGRAPHY OF THE RIGHT UPPER EXTREMITY  FLUOROSCOPY TIME:  7 MIN AND 30 SECONDS.  PROCEDURE: The patient's family member was advised of the possible risks and complications and agreed to undergo the procedure. The patient was then brought to the angiographic suite for the procedure.  The right and left arm was prepped with chlorhexidine, draped in the usual sterile fashion using maximum barrier technique (cap and mask, sterile gown, sterile gloves, large sterile sheet, hand hygiene and cutaneous antisepsis) and infiltrated locally with 1% Lidocaine.  Ultrasound demonstrated patency of the right and left brachial vein, and this was documented with an image. Under real-time ultrasound guidance, the right brachial vein was accessed with a 21 gauge micropuncture needle and image documentation was performed. A 0.018 wire was introduced in to the vein. Over this, a dilator introducer was placed, the wire was unable to be advanced proximally so a venogram was performed with 10 cc of Omnipaque, this revealed venous extravasation. The right brachial vein site was abandoned and manual pressure was held until the site was soft with no continued bleeding. The left brachial vein was accessed with  a 21 gauge micropuncture needle and multiple guidewires used to advance to the SVC/RA junction. A 5 French dual lumen power-injectable PICC was advanced, however unable to cross proximally to the lower SVC/right atrial junction, the PICC was left midline for this reason. Fluoroscopy during the procedure and fluoro spot radiograph confirms appropriate catheter position. The catheter aspirated and flushed well and was then covered with a sterile dressing.  Complications: Extravasation of 10 cc of Omnipaque to right brachial vein. Unable to advance left brachial vein PICC to the SVC/RA junction, left brachial PICC was placed midline.  IMPRESSION: Successful left arm midline VENOUS CATHETER placement with ultrasound and fluoroscopic guidance. The catheter is ready for use.  Read By:  Tsosie Billing PA-C   Electronically Signed  By: Aletta Edouard M.D.   On: 09/14/2013 11:51   Ir Fluoro Guide Cv Line Right  09/21/2013   CLINICAL DATA:  Renal failure  EXAM: IR RIGHT FLOURO GUIDE CV LINE; IR ULTRASOUND GUIDANCE VASC ACCESS RIGHT  MEDICATIONS AND MEDICAL HISTORY: None  ANESTHESIA/SEDATION: None  CONTRAST:  None  FLUOROSCOPY TIME:  18 seconds.  PROCEDURE: The procedure, risks, benefits, and alternatives were explained to the patient. Questions regarding the procedure were encouraged and answered. The patient understands and consents to the procedure.  The right neck was prepped with Betadine in a sterile fashion, and a sterile drape was applied covering the operative field. A sterile gown and sterile gloves were used for the procedure.  Under sonographic guidance, a micropuncture needle was inserted into the right internal jugular vein and removed over a 018 wire which was up sized to a 3 J. the 20 cm temporary dialysis catheter was advanced over the wire to the right atrium. It was flushed and sewn in place.  COMPLICATIONS: None  FINDINGS: Tip of the temporary dialysis catheter is at the right atrium  IMPRESSION:  Successful placement of a temporary right internal jugular vein dialysis catheter.   Electronically Signed   By: Maryclare Bean M.D.   On: 09/21/2013 12:15   Ir US Guide Vasc Access Left  09/14/2013   CLINICAL DATA:  Poor venous access, request for PICC  EXAM: DUAL LUMEN LEFT MIDLINE VENOUS CATHETER PLACEMENT WITH ULTRASOUND AND FLUOROSCOPIC GUIDANCE  ADDITIONAL VENOGRAPHY OF THE RIGHT UPPER EXTREMITY  FLUOROSCOPY TIME:  7 MIN AND 30 SECONDS.  PROCEDURE: The patient's family member was advised of the possible risks and complications and agreed to undergo the procedure. The patient was then brought to the angiographic suite for the procedure.  The right and left arm was prepped with chlorhexidine, draped in the usual sterile fashion using maximum barrier technique (cap and mask, sterile gown, sterile gloves, large sterile sheet, hand hygiene and cutaneous antisepsis) and infiltrated locally with 1% Lidocaine.  Ultrasound demonstrated patency of the right and left brachial vein, and this was documented with an image. Under real-time ultrasound guidance, the right brachial vein was accessed with a 21 gauge micropuncture needle and image documentation was performed. A 0.018 wire was introduced in to the vein. Over this, a dilator introducer was placed, the wire was unable to be advanced proximally so a venogram was performed with 10 cc of Omnipaque, this revealed venous extravasation. The right brachial vein site was abandoned and manual pressure was held until the site was soft with no continued bleeding. The left brachial vein was accessed with a 21 gauge micropuncture needle and multiple guidewires used to advance to the SVC/RA junction. A 5 French dual lumen power-injectable PICC was advanced, however unable to cross proximally to the lower SVC/right atrial junction, the PICC was left midline for this reason. Fluoroscopy during the procedure and fluoro spot radiograph confirms appropriate catheter position. The  catheter aspirated and flushed well and was then covered with a sterile dressing.  Complications: Extravasation of 10 cc of Omnipaque to right brachial vein. Unable to advance left brachial vein PICC to the SVC/RA junction, left brachial PICC was placed midline.  IMPRESSION: Successful left arm midline VENOUS CATHETER placement with ultrasound and fluoroscopic guidance. The catheter is ready for use.  Read By:  Tsosie Billing PA-C   Electronically Signed   By: Aletta Edouard M.D.   On: 09/14/2013 11:51   Ir US Guide Vasc Access Right  09/21/2013   CLINICAL DATA:  Renal failure  EXAM: IR RIGHT FLOURO GUIDE CV LINE; IR ULTRASOUND GUIDANCE VASC ACCESS RIGHT  MEDICATIONS AND MEDICAL HISTORY: None  ANESTHESIA/SEDATION: None  CONTRAST:  None  FLUOROSCOPY TIME:  18 seconds.  PROCEDURE: The procedure, risks, benefits, and alternatives were explained to the patient. Questions regarding the procedure were encouraged and answered. The patient understands and consents to the procedure.  The right neck was prepped with Betadine in a sterile fashion, and a sterile drape was applied covering the operative field. A sterile gown and sterile gloves were used for the procedure.  Under sonographic guidance, a micropuncture needle was inserted into the right internal jugular vein and removed over a 018 wire which was up sized to a 3 J. the 20 cm temporary dialysis catheter was advanced over the wire to the right atrium. It was flushed and sewn in place.  COMPLICATIONS: None  FINDINGS: Tip of the temporary dialysis catheter is at the right atrium  IMPRESSION: Successful placement of a temporary right internal jugular vein dialysis catheter.   Electronically Signed   By: Maryclare Bean M.D.   On: 09/21/2013 12:15   Dg Chest Port 1 View  09/18/2013   CLINICAL DATA:  Respiratory distress, shortness of breath.  EXAM: PORTABLE CHEST - 1 VIEW  COMPARISON:  09/17/2013  FINDINGS: Prior CABG and valve replacement. Left pass catheterization is in  stable position. Cardiomegaly. Left lower lobe opacity, likely atelectasis. No effusions or overt edema.  IMPRESSION: Cardiomegaly.  Left lower lobe atelectasis.   Electronically Signed   By: Rolm Baptise M.D.   On: 09/18/2013 06:27   Dg Chest Port 1 View  09/17/2013   CLINICAL DATA:  Hemodialysis catheter placement  EXAM: PORTABLE CHEST - 1 VIEW  COMPARISON:  DG CHEST 1V PORT dated 09/17/2013; DG CHEST 1V PORT dated 09/13/2013; DG CHEST 1V PORT dated 09/08/2013  FINDINGS: Grossly unchanged enlarged cardiac silhouette and mediastinal contours post median sternotomy and valve replacement. Interval placement of left jugular approach dialysis catheter with tip projected over the superior aspect of the SVC. Lung volumes remain reduced. Pulmonary vasculature remains indistinct. Grossly unchanged perihilar and bibasilar heterogeneous / consolidative opacities, left greater than right. No definite pleural effusion or pneumothorax. Grossly unchanged bones including postsurgical/traumatic deformity of the distal end of the right clavicle.  IMPRESSION: 1. Interval placement of left jugular approach central venous catheter with tip projected over the superior SVC. No pneumothorax. 2. Similar findings of pulmonary edema and perihilar/bibasilar opacities, left greater than right, atelectasis versus infiltrate.   Electronically Signed   By: Sandi Mariscal M.D.   On: 09/17/2013 10:41   Dg Chest Port 1 View  09/17/2013   CLINICAL DATA:  Confirm PICC line placement.  EXAM: PORTABLE CHEST - 1 VIEW  COMPARISON:  09/13/2013  FINDINGS: No PICC line is visualized within the chest or shoulder regions bilaterally.  There is cardiomegaly. Prior median sternotomy and valve replacement. Mild vascular congestion. Left lower lobe airspace opacity again noted, unchanged. No visible effusions.  IMPRESSION: No visible PICC line.  Stable cardiomegaly and vascular congestion.  Stable left lower lobe atelectasis or consolidation/pneumonia.    Electronically Signed   By: Rolm Baptise M.D.   On: 09/17/2013 04:08   Dg Chest Port 1 View  09/13/2013   CLINICAL DATA:  Respiratory distress  EXAM: PORTABLE CHEST - 1 VIEW  COMPARISON:  DG CHEST 1V PORT dated 09/08/2013  FINDINGS: Evidence of CABG reidentified with moderate enlargement of the  cardiac silhouette and central vascular congestion. Retrocardiac opacity persists. No overt alveolar edema or other filling processes otherwise identified. Trace if any left pleural fluid. No acute osseous finding.  IMPRESSION: Cardiomegaly with central vascular congestion and persistent retrocardiac atelectasis or possibly early pneumonia.   Electronically Signed   By: Conchita Paris M.D.   On: 09/13/2013 13:06   Dg Chest Port 1 View  09/08/2013   CLINICAL DATA:  Chest pain, hypertension  EXAM: PORTABLE CHEST - 1 VIEW  COMPARISON:  September 06, 2013  FINDINGS: The heart size and mediastinal contours are stable. Patient is status post prior CABG. The heart size is enlarged. Patchy consolidation of left lung base is identified. There is mild atelectasis of right lung base. There is no pulmonary edema. There is probable small left pleural effusion. The visualized skeletal structures are stable.  IMPRESSION: Early pneumonia of left lung base with small left pleural effusion.   Electronically Signed   By: Abelardo Diesel M.D.   On: 09/08/2013 23:41   Dg Abd Portable 2v  09/18/2013   CLINICAL DATA:  58 year old male with abdominal distension. Possible small bowel obstruction. Initial encounter.  EXAM: PORTABLE ABDOMEN - 2 VIEW  COMPARISON:  09/17/2013 and earlier.  FINDINGS: Supine and portable cross-table lateral views of the abdomen. Decreased gaseous distension of small bowel. Numerous gas-filled mostly nondilated loops persist. Persistent right colon gas. No pneumoperitoneum identified. Mild motion artifact on both views. Stable visualized osseous structures.  IMPRESSION: Improved bowel gas pattern with decreased gaseous  distension of small bowel and persistent colon gas. Pattern could reflect improving small bowel obstruction or ileus.  No free air identified.   Electronically Signed   By: Lars Pinks M.D.   On: 09/18/2013 17:50    CBC  Recent Labs Lab 09/23/13 0339 09/24/13 0050 09/25/13 0452 09/26/13 0404 09/27/13 0505  WBC 15.3* 12.1* 11.5* 16.8* 16.1*  HGB 9.0* 9.0* 7.9* 8.1* 8.9*  HCT 24.8* 25.5* 22.4* 23.2* 25.6*  PLT 275 255 243 272 274  MCV 80.8 82.0 82.7 83.2 83.7  MCH 29.3 28.9 29.2 29.0 29.1  MCHC 36.3* 35.3 35.3 34.9 34.8  RDW 16.6* 17.2* 17.3* 17.1* 17.4*    Chemistries   Recent Labs Lab 09/23/13 0339 09/24/13 0050 09/25/13 0452 09/26/13 0404 09/27/13 0505  NA 138 135* 137 136* 140  K 3.5* 3.8 4.0 4.2 3.5*  CL 98 93* 97 96 97  CO2 23 20 25 24 28   GLUCOSE 112* 105* 122* 105* 111*  BUN 31* 41* 20 27* 15  CREATININE 6.42* 7.51* 5.19* 7.32* 4.71*  CALCIUM 7.6* 7.7* 7.8* 8.2* 8.6  AST  --  23 22  --   --   ALT  --  19 16  --   --   ALKPHOS  --  54 50  --   --   BILITOT  --  0.9 0.6  --   --    ------------------------------------------------------------------------------------------------------------------ estimated creatinine clearance is 21.4 ml/min (by C-G formula based on Cr of 4.71). ------------------------------------------------------------------------------------------------------------------ No results found for this basename: HGBA1C,  in the last 72 hours ------------------------------------------------------------------------------------------------------------------  Recent Labs  09/25/13 0452  CHOL 145  HDL 27*  LDLCALC 87  TRIG 156*  CHOLHDL 5.4   ------------------------------------------------------------------------------------------------------------------ No results found for this basename: TSH, T4TOTAL, FREET3, T3FREE, THYROIDAB,  in the last 72  hours ------------------------------------------------------------------------------------------------------------------  Recent Labs  09/26/13 1359  TIBC 86*  IRON 20*    Coagulation profile  Recent Labs Lab 09/23/13 0339 09/24/13 0050  09/25/13 0452 09/26/13 0404 09/27/13 0505  INR 1.34 1.55* 1.43 1.29 1.36    No results found for this basename: DDIMER,  in the last 72 hours  Cardiac Enzymes No results found for this basename: CK, CKMB, TROPONINI, MYOGLOBIN,  in the last 168 hours ------------------------------------------------------------------------------------------------------------------ No components found with this basename: POCBNP,     Bradley [name] D.O. on 09/27/2013 at 9:01 AM  Between 7am to 7pm - Pager - [phone]  After 7pm go to [email or web address] - password [email or web address]  And look for the night coverage person covering for me after hours  [facility] Office  [phone]

## 2013-09-27 NOTE — Progress Notes (Signed)
Patient alert,oriented to person but not to place,time and situation,pt picking on his right chest hemodialysis catheter,bilateral safety mittens applied.Will continue to monitor. Jaclene Bartelt Joselita,RN

## 2013-09-28 LAB — CBC
HCT: 24.5 % — ABNORMAL LOW (ref 39.0–52.0)
Hemoglobin: 8.5 g/dL — ABNORMAL LOW (ref 13.0–17.0)
MCH: 29.1 pg (ref 26.0–34.0)
MCHC: 34.7 g/dL (ref 30.0–36.0)
MCV: 83.9 fL (ref 78.0–100.0)
Platelets: 310 10*3/uL (ref 150–400)
RBC: 2.92 MIL/uL — ABNORMAL LOW (ref 4.22–5.81)
RDW: 17.4 % — ABNORMAL HIGH (ref 11.5–15.5)
WBC: 18.3 10*3/uL — ABNORMAL HIGH (ref 4.0–10.5)

## 2013-09-28 LAB — BASIC METABOLIC PANEL
BUN: 23 mg/dL (ref 6–23)
CO2: 24 mEq/L (ref 19–32)
Calcium: 8.4 mg/dL (ref 8.4–10.5)
Chloride: 99 mEq/L (ref 96–112)
Creatinine, Ser: 6.92 mg/dL — ABNORMAL HIGH (ref 0.50–1.35)
GFR calc Af Amer: 9 mL/min — ABNORMAL LOW (ref >90)
GFR calc non Af Amer: 8 mL/min — ABNORMAL LOW (ref >90)
Glucose, Bld: 103 mg/dL — ABNORMAL HIGH (ref 70–99)
Potassium: 4 mEq/L (ref 3.7–5.3)
Sodium: 139 mEq/L (ref 137–147)

## 2013-09-28 LAB — GLUCOSE, CAPILLARY
Glucose-Capillary: 97 mg/dL (ref 70–99)
Glucose-Capillary: 103 mg/dL — ABNORMAL HIGH (ref 70–99)
Glucose-Capillary: 96 mg/dL (ref 70–99)
Glucose-Capillary: 108 mg/dL — ABNORMAL HIGH (ref 70–99)

## 2013-09-28 LAB — PROTIME-INR
INR: 1.33 (ref 0.00–1.49)
Prothrombin Time: 16.2 seconds — ABNORMAL HIGH (ref 11.6–15.2)

## 2013-09-28 LAB — CLOSTRIDIUM DIFFICILE BY PCR: Toxigenic C. Difficile by PCR: NEGATIVE

## 2013-09-28 MED ORDER — CLONAZEPAM 1 MG PO TABS
1.0000 mg | ORAL_TABLET | Freq: Every evening | ORAL | Status: DC | PRN
  Administered 2013-09-30 – 2013-10-09 (×7): 1 mg via ORAL
  Filled 2013-09-28 (×9): qty 1

## 2013-09-28 MED ORDER — LORAZEPAM 0.5 MG PO TABS
0.5000 mg | ORAL_TABLET | Freq: Every day | ORAL | Status: DC | PRN
  Administered 2013-09-28 – 2013-10-10 (×8): 0.5 mg via ORAL
  Filled 2013-09-28 (×7): qty 1

## 2013-09-28 NOTE — Progress Notes (Signed)
[facility]                                                                              Patient Demographics  Bradley [name], is a 58 y.o. male, DOB - July 11, 1956, [id number]  Admit date - 09/08/2013   Admitting Physician Berle Mull, MD  Outpatient Primary MD for the patient is [name], MD  LOS - 20   Chief Complaint  Patient presents with  . Altered Mental Status      Brief narrative:  58 year old male with history of aortic stenosis, status post pediatric aortic valvulotomy at age 21 followed by tissue AVR (Bentall procedure) 02/2000 with cardiac cath 02/2010 with normal coronaries, chronic LBBB, thyroid cancer post thyroidectomy >> hypothyroidism, HTN, and depression, who had been generally unwell for 2-3 weeks. He was seen in the ED on 09/06/13 and assessed as influenza related vomiting, diarrhea, abdominal pain, body aches, fever and mild confusion and was ultimately discharged on Tamiflu. Patient worsened at home with progressive confusion/disorientation and was admitted with sepsis, septic brain emboli and rule out infective endocarditis.   Assessment & Plan   Severe sepsis secondary to MSSA bacteremia w/ sepsis / infective endocarditis w/ cerebral septic emboli / UTI  -Blood cultures x 2 MSSA - follow up cultures negative, however on antibiotics -Chest x-ray suggested early pneumonia of left lung base with small left pleural effusion  -UA cx with MSSA UTI  -TTE (09/09/13) without vegetation  -Cardiology recommends 6 weeks of antibiotics for presumed endocarditis, no TEE at this time -has prior tissue AVR  -ID directing antibiotics, currently on naficillin and rifampin -Patient continues to have low grade fever, however, leukocytosis trending downward -Cdiff PCR negative on 2/16 and 09/16/13 -Blood cultures from 09/24/13, show no growth to date  Toxic metabolic encephalopathy  -secondary to severe sepsis, azotemia, and septic brain emboli  -MRI Brain  (09/09/13) with multiple bilateral supra tentorial and right cerebellar regions consistent with acute partially hemorrhagic infarcts suspected secondary to septic emboli  -Continue supportive care and treat underlying causes  -Neurology has evaluated  -QTc prolonged so haldol not safe  -[id number], folic acid [id number], and ammonia 17   Severe thrombocytopenia  -Resolved, Platelet counts dropped to a nadir of 23  -felt to be due to sepsis   Acute Renal Failure/Acute tubular necrosis  -due to bacteremia/immune complex or gentamicin  -baseline renal fnx: 18 / 0.93 -progressive failure since admit -Transferred to [name] 09/15/2013 for hemodialysis  -Hemodialysis started 09/17/2013  -Renal US normal kidneys, no hydronephrosis -Nephrology directing care   Abdominal distention/diarrhea  -distention improved -diarrhea likely due to antibiotics, which has resolved -C diff negative 2/16 and 2/17   Hypokalemia  -Resolved, K4.0,  -will continue K-dur 96mEq daily and continue to monitor   NSTEMI (Type II)  -peak Tropinin 2.26  -Cardiology has no plans for cath at this point  -has stable ST elevation r/t LBBB   Acute Systolic HF and Chronic grade 1 Diastolic CHF  -ECHO Oct 123456 with EF 60-65% with severe LVH and no RWMA  -ECHO Sep 09, 2013 EF 45-50% with septal and apical hypokinesis   History of thyroid cancer, post  thyroidectomy hypothyroidism  -TSH elevated at 10.174  -Synthroid increased  -Will need monitoring and repeat labs in 4-6 weeks   Hypertension  -Holding lisinopril due to acute renal failure  -Continue metoprolol IV PRN  Hyperglycemia  -Hemoglobin A1c 5.7  -Continue ISS and CBG monitoring  History of depression  -hold Zoloft secondary to acute encephalopathy.   Anxiety/Insomnia -Will discontinue -Will add Klonopin QHS PRN, and Ativan daily PRN -will continue to monitor  Hyponatremia  -Resolved, due to volume overload  -corrected after initiation of  hemodialysis  Deconditioning -CIR consulted, and pending evaluation  Code Status: Full  Family Communication: None at bedside  Disposition Plan: Admitted.  Pending CIR evaluation  Time Spent in minutes   20 minutes  Procedures/Course  2/09 - admit  2/10 - ID, Heme and Cards consults. TTE EF 45%. No clear vegetations  2/14 - PCCM consult  2/15 - Junctional rhythm with precedex, mental status improved. Transferred to [facility] for possible HD 2/16 - Renal U/S- normal kidneys, no hydronephrosis  2/18 - transferred to ICU for diaphoresis, ST changes and confusion 2/21 - patient pulled out L IJ HD catheter that was placed on 09/17/13 2/21 - L  HD catheter placed 2/26 - Conversion of R IJ temp cath to [facility] cath  [name]   Nephrology PCCM Infectious disease Interventional radiology Cardiology Neurology Hematology CIR  DVT Prophylaxis Heparin   Lab Results  Component Value Date   PLT 310 09/28/2013    Medications  Scheduled Meds: . antiseptic oral rinse  15 mL Mouth Rinse q12n4p  . aspirin EC  81 mg Oral Daily  . chlorhexidine  15 mL Mouth Rinse BID  . feeding supplement (RESOURCE BREEZE)  1 Container Oral TID WC  . [START ON 09/29/2013] ferric gluconate (FERRLECIT/NULECIT) IV  125 mg Intravenous Weekly  . heparin subcutaneous  5,000 Units Subcutaneous 3 times per day  . insulin aspart  0-9 Units Subcutaneous TID WC  . levothyroxine  200 mcg Oral QAC breakfast  . multivitamin  1 tablet Oral QHS  . nafcillin IV  2 g Intravenous Q4H  . pantoprazole  40 mg Oral Q1200  . potassium chloride  20 mEq Oral Daily  . rifampin  600 mg Oral Daily  . sevelamer carbonate  1,600 mg Oral TID WC  . sodium chloride  3 mL Intravenous Q12H   Continuous Infusions:  PRN Meds:.acetaminophen, acetaminophen, heparin, hydrOXYzine, liver oil-zinc oxide, metoprolol, morphine injection, ondansetron (ZOFRAN) IV  Antibiotics    Anti-infectives   Start     Dose/Rate Route Frequency Ordered Stop    09/24/13 1400  rifampin (RIFADIN) capsule 600 mg     600 mg Oral Daily 09/24/13 1254     09/14/13 1400  rifampin (RIFADIN) 300 mg in sodium chloride 0.9 % 100 mL IVPB  Status:  Discontinued     300 mg 200 mL/hr over 30 Minutes Intravenous 3 times per day 09/14/13 1210 09/24/13 1254   09/11/13 1800  gentamicin (GARAMYCIN) 90 mg in dextrose 5 % 50 mL IVPB  Status:  Discontinued     90 mg 104.5 mL/hr over 30 Minutes Intravenous Every 12 hours 09/11/13 0932 09/13/13 0802   09/09/13 1800  nafcillin 2 g in dextrose 5 % 50 mL IVPB     2 g 100 mL/hr over 30 Minutes Intravenous Every 4 hours 09/09/13 1415     09/09/13 1430  nafcillin 2 g in dextrose 5 % 50 mL IVPB     2 g 100 mL/hr  over 30 Minutes Intravenous  Once 09/09/13 1415 09/09/13 1603   09/09/13 1415  nafcillin injection 2 g  Status:  Discontinued     2 g Intravenous Every 4 hours 09/09/13 1410 09/09/13 1413   09/09/13 0800  vancomycin (VANCOCIN) IVPB 1000 mg/200 mL premix  Status:  Discontinued     1,000 mg 200 mL/hr over 60 Minutes Intravenous Every 8 hours 09/09/13 0341 09/11/13 0829   09/09/13 0500  gentamicin (GARAMYCIN) 90 mg in dextrose 5 % 50 mL IVPB  Status:  Discontinued     1 mg/kg  90.7 kg 104.5 mL/hr over 30 Minutes Intravenous 3 times per day 09/09/13 0340 09/11/13 0932   09/09/13 0400  ceFEPIme (MAXIPIME) 2 g in dextrose 5 % 50 mL IVPB  Status:  Discontinued     2 g 100 mL/hr over 30 Minutes Intravenous 3 times per day 09/09/13 0340 09/09/13 1410   09/08/13 2315  piperacillin-tazobactam (ZOSYN) IVPB 3.375 g     3.375 g 100 mL/hr over 30 Minutes Intravenous  Once 09/08/13 2307 09/09/13 0043   09/08/13 2315  vancomycin (VANCOCIN) IVPB 1000 mg/200 mL premix     1,000 mg 200 mL/hr over 60 Minutes Intravenous  Once 09/08/13 2307 09/09/13 0230        Subjective:   Bradley [name] seen and examined today.  Patient is oriented to self and place.  He currently has no complaints.  He states he feels his" breathing gets slow  sometimes."  Objective:   Filed Vitals:   09/27/13 1700 09/27/13 2347 09/28/13 0441 09/28/13 0808  BP: 120/84 133/84 147/92 132/94  Pulse: 102 104 97 112  Temp: 98.4 F (36.9 C) 98.9 F (37.2 C) 98.6 F (37 C) 98.6 F (37 C)  TempSrc: Oral Oral Oral Axillary  Resp: 19 17 16 18   Height:      Weight:  98 kg (216 lb 0.8 oz)    SpO2: 99% 97% 100% 100%    Wt Readings from Last 3 Encounters:  09/27/13 98 kg (216 lb 0.8 oz)  09/06/13 90.719 kg (200 lb)  05/07/13 94.348 kg (208 lb)     Intake/Output Summary (Last 24 hours) at 09/28/13 0909 Last data filed at 09/28/13 0700  Gross per 24 hour  Intake    490 ml  Output      6 ml  Net    484 ml    Exam  General: Well developed, well nourished, NAD, appears stated age  HEENT: NCAT, mucous membranes moist.   Neck: Supple, no JVD, no masses  Cardiovascular: S1 S2 auscultated, 2/6 SEM, Regular rate and rhythm.  Respiratory: Clear to auscultation bilaterally with equal chest rise  Abdomen: Soft, nontender, nondistended, + bowel sounds  Extremities: warm dry without cyanosis clubbing.  Neuro: Awake and alert, however, not oriented. Has left hemineglect.  Skin: Without rashes exudates or nodules  Data Review   Micro Results Recent Results (from the past 240 hour(s))  CULTURE, BLOOD (ROUTINE X 2)     Status: None   Collection Time    09/24/13 12:44 AM      Result Value Ref Range Status   Specimen Description BLOOD RIGHT ARM   Final   Special Requests BOTTLES DRAWN AEROBIC ONLY [facility]   Final   Culture  Setup Time     Final   Value: 09/24/2013 03:21     Performed at [facility]   Culture     Final   Value:  BLOOD CULTURE RECEIVED NO GROWTH TO DATE CULTURE WILL BE HELD FOR 5 DAYS BEFORE ISSUING A FINAL NEGATIVE REPORT     Performed at [facility]   Report Status PENDING   Incomplete  CULTURE, BLOOD (ROUTINE X 2)     Status: None   Collection Time    09/24/13 12:50 AM      Result Value Ref  Range Status   Specimen Description BLOOD LEFT HAND   Final   Special Requests BOTTLES DRAWN AEROBIC ONLY 4[facility]   Final   Culture  Setup Time     Final   Value: 09/24/2013 03:21     Performed at [facility]   Culture     Final   Value:        BLOOD CULTURE RECEIVED NO GROWTH TO DATE CULTURE WILL BE HELD FOR 5 DAYS BEFORE ISSUING A FINAL NEGATIVE REPORT     Performed at [facility]   Report Status PENDING   Incomplete    Radiology Reports Dg Chest 2 View  09/06/2013   CLINICAL DATA:  Fever, nausea, vomiting.  EXAM: CHEST  2 VIEW  COMPARISON:  08/17/2011  FINDINGS: Prior median sternotomy and valve replacement. Mild cardiomegaly and vascular congestion. Low lung volumes with bibasilar atelectasis. No effusions. No acute bony abnormality.  IMPRESSION: Cardiomegaly, bibasilar atelectasis.   Electronically Signed   By: Rolm Baptise M.D.   On: 09/06/2013 19:34   Dg Abd 1 View  09/17/2013   CLINICAL DATA:  Abdominal distension  EXAM: ABDOMEN - 1 VIEW  COMPARISON:  CT ABD/PELVIS W CM dated 09/06/2013  FINDINGS: There is at least moderate gas distention of multiple loops of predominantly small bowel, though there may be minimal gaseous distention of the splenic flexure of the colon.  Examination is degraded secondary to the coned field-of-view on solitary provided radiograph.  Nondiagnostic evaluation for pneumoperitoneum secondary supine positioning and exclusion of the lower thorax. No definite pneumatosis or portal venous gas.  No definite abnormal intra-abdominal calcifications.  Degenerative change the lower lumbar spine.  IMPRESSION: Degraded examination with findings concerning for small bowel obstruction though there may be a minimal amount of air within the splenic flexure of the colon. Clinical correlation is advised. Further evaluation with complete abdominal radiographic series may be performed as clinically indicated.   Electronically Signed   By: Sandi Mariscal M.D.   On: 09/17/2013  10:38   Ct Head Wo Contrast  09/12/2013   CLINICAL DATA:  Increased mental status change, evaluate for CVA versus worsening bleed.  EXAM: CT HEAD WITHOUT CONTRAST  TECHNIQUE: Contiguous axial images were obtained from the base of the skull through the vertex without intravenous contrast.  COMPARISON:  Prior MRI and and CT from 09/09/2013.  FINDINGS: Study is degraded by motion artifact.  Vasogenic edema as trending linear hyperdensity within the left parietal lobe is stable not significantly changed relative to the previous examination. Vague hypodensities involving the supratentorial brain and right cerebellar hemisphere are not significantly changed relative to recent MRI. No definite a new intracranial infarct identified. Previously seen hyperdense foci within the mid bilateral frontal lobes are less conspicuous as compared to prior. No new intracranial hemorrhage. No mass lesion or midline shift. Ventricles are stable in size without evidence of hydrocephalus. No extra-axial fluid collection.  The calvarium is grossly intact. Orbits are normal. Paranasal sinuses and mastoid air cells remain clear.  IMPRESSION: 1. Limited study due to motion. No significant interval change in subacute left  parietal infarct with small amount of associated hemorrhage. Additional multi focal infarcts involving the supratentorial brain and right cerebellar hemisphere are not significantly changed as well relative to recent MRI from 09/09/2013. No definite new intracranial infarct identified. No new intracranial hemorrhage.   Electronically Signed   By: Jeannine Boga M.D.   On: 09/12/2013 22:02   Ct Head Wo Contrast  09/09/2013   CLINICAL DATA:  Fever.  Altered mental status.  EXAM: CT HEAD WITHOUT CONTRAST  TECHNIQUE: Contiguous axial images were obtained from the base of the skull through the vertex without intravenous contrast.  COMPARISON:  CT HEAD W/O CM dated 09/06/2013; DG CHEST 1V PORT dated 09/08/2013; CT ABD/PELVIS  W CM dated 09/06/2013  FINDINGS: Small focus of linear high attenuation in the left parietal lobe appear similar to the prior exam. The amount of edema at has increased compared to the prior exam suggesting a subacute process. This appears wedge-shaped in may represent an embolic infarct. There is a similar faint area of high attenuation in the left frontal lobe on image number 22 low with a more subtle area in the right frontal lobe that is more equivocal. The dural venous sinuses appear within normal limits. No mass lesion or midline shift. No hydrocephalus.  The calvarium is intact. Left-sided nasal septal spur. Mastoid air cells clear. Paranasal sinuses are normal.  IMPRESSION: 1. Increasing edema around linear high attenuation in the left parietal lobe. Followup MRI with and without contrast is recommended for further assessment. The patient presents with an infectious syndrome and septic emboli are in the differential considerations. Infarct with a small amount of hemorrhage or cortical laminar necrosis as well as developmental venous anomaly remain in the differential considerations. MRI should be useful in further characterization. 2. Dr. Leonel Ramsay called discuss the case at 0230 hr on the day of examination.   Electronically Signed   By: Dereck Ligas M.D.   On: 09/09/2013 02:32   Ct Head Wo Contrast  09/06/2013   CLINICAL DATA:  Confusion.  EXAM: CT HEAD WITHOUT CONTRAST  TECHNIQUE: Contiguous axial images were obtained from the base of the skull through the vertex without intravenous contrast.  COMPARISON:  None.  FINDINGS: There is a 1.3 cm curvilinear focus of hyperdensity in the subcortical left parietal lobe (image 25). There is no evidence of surrounding edema. There is no other evidence of acute intracranial hemorrhage. There is no evidence of acute cortical infarct, mass, midline shift, or extra-axial fluid collection. Subcentimeter low density focus in the left caudate is suggestive of a  remote lacunar infarct. Ventricles and sulci are normal. There is very mild mucosal thickening in the frontal sinuses and ethmoid air cells bilaterally. Mastoid air cells are clear. Orbits are unremarkable.  IMPRESSION: 1. Curvilinear hyperdensity in the subcortical left parietal lobe. Acute intracranial hemorrhage cannot be completely excluded, however there is no surrounding edema or evidence of hemorrhage elsewhere in the brain and the patient is not presenting in the setting of acute trauma. Given the curvilinear configuration, this may be vascular and represent an incidental developmental venous anomaly. 2. Remote left caudate lacunar infarct.   Electronically Signed   By: Logan Bores   On: 09/06/2013 22:29   Mr Brain W Wo Contrast  09/09/2013   CLINICAL DATA:  Post aortic valve replacement 2011. Presenting with altered mental status, elevated white count and abnormal CT.  EXAM: MRI HEAD WITHOUT AND WITH CONTRAST  TECHNIQUE: Multiplanar, multiecho pulse sequences of the brain and surrounding structures  were obtained without and with intravenous contrast.  CONTRAST:  53mL MULTIHANCE GADOBENATE DIMEGLUMINE 529 MG/ML IV SOLN  COMPARISON:  09/09/2013 and 09/06/2013 CT.  No comparison MR.  FINDINGS: Exam is motion degraded.  Multiple bilateral supra tentorial and right cerebellar regions of restricted motion some of which are partially hemorrhagic with largest area left parietal lobe. In the present clinical setting, these findings are consistent with acute partially hemorrhagic infarcts and possibly related to septic emboli.  At most there is very minimal enhancement of the left parietal partially hemorrhagic infarct.  No intracranial mass lesion seen separate from the above described findings.  Major intracranial vascular structures are patent.  IMPRESSION: Exam is motion degraded.  Multiple bilateral supra tentorial and right cerebellar regions of restricted motion some of which are partially hemorrhagic with  largest area left parietal lobe. In the present clinical setting, these findings are consistent with acute partially hemorrhagic infarcts and possibly related to septic emboli.  These results were called by telephone at the time of interpretation on 09/09/2013 at 11:54 AM to Dr. Algis Liming , who verbally acknowledged these results.   Electronically Signed   By: Chauncey Cruel M.D.   On: 09/09/2013 12:03   Ct Abdomen Pelvis W Contrast  09/06/2013   CLINICAL DATA:  58 year old male abdominal pain, vomiting, diarrhea and low grade fever.  EXAM: CT ABDOMEN AND PELVIS WITH CONTRAST  TECHNIQUE: Multidetector CT imaging of the abdomen and pelvis was performed using the standard protocol following bolus administration of intravenous contrast.  CONTRAST:  100 cc intravenous Omnipaque 300  COMPARISON:  None.  FINDINGS: Cardiomegaly and aortic valve replacement noted. Mild bibasilar atelectasis/scarring is identified.  The liver, spleen, pancreas, gallbladder adrenal glands and kidneys are unremarkable except for small bilateral renal cysts.  There is no evidence of free fluid, enlarged lymph nodes, biliary dilation or abdominal aortic aneurysm.  The bowel, bladder and appendix are unremarkable. There is no evidence of bowel obstruction, abscess or pneumoperitoneum.  A small right inguinal hernia containing fat is identified.  Mild prostate enlargement is present.  No acute or suspicious bony abnormalities are identified.  IMPRESSION: No evidence of acute abnormality.  Cardiomegaly, aortic valve replacement, prostate enlargement and small right inguinal hernias containing fat.   Electronically Signed   By: Hassan Rowan M.D.   On: 09/06/2013 22:32   Ir Veno/ext/uni Right  09/14/2013   CLINICAL DATA:  Poor venous access, request for PICC  EXAM: DUAL LUMEN LEFT MIDLINE VENOUS CATHETER PLACEMENT WITH ULTRASOUND AND FLUOROSCOPIC GUIDANCE  ADDITIONAL VENOGRAPHY OF THE RIGHT UPPER EXTREMITY  FLUOROSCOPY TIME:  7 MIN AND 30 SECONDS.   PROCEDURE: The patient's family member was advised of the possible risks and complications and agreed to undergo the procedure. The patient was then brought to the angiographic suite for the procedure.  The right and left arm was prepped with chlorhexidine, draped in the usual sterile fashion using maximum barrier technique (cap and mask, sterile gown, sterile gloves, large sterile sheet, hand hygiene and cutaneous antisepsis) and infiltrated locally with 1% Lidocaine.  Ultrasound demonstrated patency of the right and left brachial vein, and this was documented with an image. Under real-time ultrasound guidance, the right brachial vein was accessed with a 21 gauge micropuncture needle and image documentation was performed. A 0.018 wire was introduced in to the vein. Over this, a dilator introducer was placed, the wire was unable to be advanced proximally so a venogram was performed with 10 cc of Omnipaque, this revealed venous extravasation.  The right brachial vein site was abandoned and manual pressure was held until the site was soft with no continued bleeding. The left brachial vein was accessed with a 21 gauge micropuncture needle and multiple guidewires used to advance to the SVC/RA junction. A 5 French dual lumen power-injectable PICC was advanced, however unable to cross proximally to the lower SVC/right atrial junction, the PICC was left midline for this reason. Fluoroscopy during the procedure and fluoro spot radiograph confirms appropriate catheter position. The catheter aspirated and flushed well and was then covered with a sterile dressing.  Complications: Extravasation of 10 cc of Omnipaque to right brachial vein. Unable to advance left brachial vein PICC to the SVC/RA junction, left brachial PICC was placed midline.  IMPRESSION: Successful left arm midline VENOUS CATHETER placement with ultrasound and fluoroscopic guidance. The catheter is ready for use.  Read By:  Tsosie Billing PA-C   Electronically  Signed   By: Aletta Edouard M.D.   On: 09/14/2013 11:51   US Renal  09/15/2013   CLINICAL DATA:  Acute renal failure.  EXAM: RENAL/URINARY TRACT ULTRASOUND COMPLETE  COMPARISON:  CT abdomen and pelvis 09/06/2013.  FINDINGS: Right Kidney:  Length: Approximately 12.7 cm. No hydronephrosis. Well-preserved cortex. No shadowing calculi. Normal parenchymal echotexture without focal abnormalities.  Left Kidney:  Length: Approximately 12.5 cm. No hydronephrosis. Well-preserved cortex. No shadowing calculi. Normal parenchymal echotexture without focal abnormalities.  Bladder:  Decompressed by Foley catheter.  IMPRESSION: Normal kidneys.  Specifically, no evidence of hydronephrosis.   Electronically Signed   By: Evangeline Dakin M.D.   On: 09/15/2013 10:55   Ir Fluoro Guide Cv Line Left  09/14/2013   CLINICAL DATA:  Poor venous access, request for PICC  EXAM: DUAL LUMEN LEFT MIDLINE VENOUS CATHETER PLACEMENT WITH ULTRASOUND AND FLUOROSCOPIC GUIDANCE  ADDITIONAL VENOGRAPHY OF THE RIGHT UPPER EXTREMITY  FLUOROSCOPY TIME:  7 MIN AND 30 SECONDS.  PROCEDURE: The patient's family member was advised of the possible risks and complications and agreed to undergo the procedure. The patient was then brought to the angiographic suite for the procedure.  The right and left arm was prepped with chlorhexidine, draped in the usual sterile fashion using maximum barrier technique (cap and mask, sterile gown, sterile gloves, large sterile sheet, hand hygiene and cutaneous antisepsis) and infiltrated locally with 1% Lidocaine.  Ultrasound demonstrated patency of the right and left brachial vein, and this was documented with an image. Under real-time ultrasound guidance, the right brachial vein was accessed with a 21 gauge micropuncture needle and image documentation was performed. A 0.018 wire was introduced in to the vein. Over this, a dilator introducer was placed, the wire was unable to be advanced proximally so a venogram was  performed with 10 cc of Omnipaque, this revealed venous extravasation. The right brachial vein site was abandoned and manual pressure was held until the site was soft with no continued bleeding. The left brachial vein was accessed with a 21 gauge micropuncture needle and multiple guidewires used to advance to the SVC/RA junction. A 5 French dual lumen power-injectable PICC was advanced, however unable to cross proximally to the lower SVC/right atrial junction, the PICC was left midline for this reason. Fluoroscopy during the procedure and fluoro spot radiograph confirms appropriate catheter position. The catheter aspirated and flushed well and was then covered with a sterile dressing.  Complications: Extravasation of 10 cc of Omnipaque to right brachial vein. Unable to advance left brachial vein PICC to the SVC/RA junction, left brachial PICC  was placed midline.  IMPRESSION: Successful left arm midline VENOUS CATHETER placement with ultrasound and fluoroscopic guidance. The catheter is ready for use.  Read By:  Tsosie Billing PA-C   Electronically Signed   By: Aletta Edouard M.D.   On: 09/14/2013 11:51   Ir Fluoro Guide Cv Line Right  09/21/2013   CLINICAL DATA:  Renal failure  EXAM: IR RIGHT FLOURO GUIDE CV LINE; IR ULTRASOUND GUIDANCE VASC ACCESS RIGHT  MEDICATIONS AND MEDICAL HISTORY: None  ANESTHESIA/SEDATION: None  CONTRAST:  None  FLUOROSCOPY TIME:  18 seconds.  PROCEDURE: The procedure, risks, benefits, and alternatives were explained to the patient. Questions regarding the procedure were encouraged and answered. The patient understands and consents to the procedure.  The right neck was prepped with Betadine in a sterile fashion, and a sterile drape was applied covering the operative field. A sterile gown and sterile gloves were used for the procedure.  Under sonographic guidance, a micropuncture needle was inserted into the right internal jugular vein and removed over a 018 wire which was up sized to a 3  J. the 20 cm temporary dialysis catheter was advanced over the wire to the right atrium. It was flushed and sewn in place.  COMPLICATIONS: None  FINDINGS: Tip of the temporary dialysis catheter is at the right atrium  IMPRESSION: Successful placement of a temporary right internal jugular vein dialysis catheter.   Electronically Signed   By: Maryclare Bean M.D.   On: 09/21/2013 12:15   Ir US Guide Vasc Access Left  09/14/2013   CLINICAL DATA:  Poor venous access, request for PICC  EXAM: DUAL LUMEN LEFT MIDLINE VENOUS CATHETER PLACEMENT WITH ULTRASOUND AND FLUOROSCOPIC GUIDANCE  ADDITIONAL VENOGRAPHY OF THE RIGHT UPPER EXTREMITY  FLUOROSCOPY TIME:  7 MIN AND 30 SECONDS.  PROCEDURE: The patient's family member was advised of the possible risks and complications and agreed to undergo the procedure. The patient was then brought to the angiographic suite for the procedure.  The right and left arm was prepped with chlorhexidine, draped in the usual sterile fashion using maximum barrier technique (cap and mask, sterile gown, sterile gloves, large sterile sheet, hand hygiene and cutaneous antisepsis) and infiltrated locally with 1% Lidocaine.  Ultrasound demonstrated patency of the right and left brachial vein, and this was documented with an image. Under real-time ultrasound guidance, the right brachial vein was accessed with a 21 gauge micropuncture needle and image documentation was performed. A 0.018 wire was introduced in to the vein. Over this, a dilator introducer was placed, the wire was unable to be advanced proximally so a venogram was performed with 10 cc of Omnipaque, this revealed venous extravasation. The right brachial vein site was abandoned and manual pressure was held until the site was soft with no continued bleeding. The left brachial vein was accessed with a 21 gauge micropuncture needle and multiple guidewires used to advance to the SVC/RA junction. A 5 French dual lumen power-injectable PICC was  advanced, however unable to cross proximally to the lower SVC/right atrial junction, the PICC was left midline for this reason. Fluoroscopy during the procedure and fluoro spot radiograph confirms appropriate catheter position. The catheter aspirated and flushed well and was then covered with a sterile dressing.  Complications: Extravasation of 10 cc of Omnipaque to right brachial vein. Unable to advance left brachial vein PICC to the SVC/RA junction, left brachial PICC was placed midline.  IMPRESSION: Successful left arm midline VENOUS CATHETER placement with ultrasound and fluoroscopic guidance. The catheter  is ready for use.  Read By:  Tsosie Billing PA-C   Electronically Signed   By: Aletta Edouard M.D.   On: 09/14/2013 11:51   Ir US Guide Vasc Access Right  09/21/2013   CLINICAL DATA:  Renal failure  EXAM: IR RIGHT FLOURO GUIDE CV LINE; IR ULTRASOUND GUIDANCE VASC ACCESS RIGHT  MEDICATIONS AND MEDICAL HISTORY: None  ANESTHESIA/SEDATION: None  CONTRAST:  None  FLUOROSCOPY TIME:  18 seconds.  PROCEDURE: The procedure, risks, benefits, and alternatives were explained to the patient. Questions regarding the procedure were encouraged and answered. The patient understands and consents to the procedure.  The right neck was prepped with Betadine in a sterile fashion, and a sterile drape was applied covering the operative field. A sterile gown and sterile gloves were used for the procedure.  Under sonographic guidance, a micropuncture needle was inserted into the right internal jugular vein and removed over a 018 wire which was up sized to a 3 J. the 20 cm temporary dialysis catheter was advanced over the wire to the right atrium. It was flushed and sewn in place.  COMPLICATIONS: None  FINDINGS: Tip of the temporary dialysis catheter is at the right atrium  IMPRESSION: Successful placement of a temporary right internal jugular vein dialysis catheter.   Electronically Signed   By: Maryclare Bean M.D.   On: 09/21/2013 12:15    Dg Chest Port 1 View  09/18/2013   CLINICAL DATA:  Respiratory distress, shortness of breath.  EXAM: PORTABLE CHEST - 1 VIEW  COMPARISON:  09/17/2013  FINDINGS: Prior CABG and valve replacement. Left pass catheterization is in stable position. Cardiomegaly. Left lower lobe opacity, likely atelectasis. No effusions or overt edema.  IMPRESSION: Cardiomegaly.  Left lower lobe atelectasis.   Electronically Signed   By: Rolm Baptise M.D.   On: 09/18/2013 06:27   Dg Chest Port 1 View  09/17/2013   CLINICAL DATA:  Hemodialysis catheter placement  EXAM: PORTABLE CHEST - 1 VIEW  COMPARISON:  DG CHEST 1V PORT dated 09/17/2013; DG CHEST 1V PORT dated 09/13/2013; DG CHEST 1V PORT dated 09/08/2013  FINDINGS: Grossly unchanged enlarged cardiac silhouette and mediastinal contours post median sternotomy and valve replacement. Interval placement of left jugular approach dialysis catheter with tip projected over the superior aspect of the SVC. Lung volumes remain reduced. Pulmonary vasculature remains indistinct. Grossly unchanged perihilar and bibasilar heterogeneous / consolidative opacities, left greater than right. No definite pleural effusion or pneumothorax. Grossly unchanged bones including postsurgical/traumatic deformity of the distal end of the right clavicle.  IMPRESSION: 1. Interval placement of left jugular approach central venous catheter with tip projected over the superior SVC. No pneumothorax. 2. Similar findings of pulmonary edema and perihilar/bibasilar opacities, left greater than right, atelectasis versus infiltrate.   Electronically Signed   By: Sandi Mariscal M.D.   On: 09/17/2013 10:41   Dg Chest Port 1 View  09/17/2013   CLINICAL DATA:  Confirm PICC line placement.  EXAM: PORTABLE CHEST - 1 VIEW  COMPARISON:  09/13/2013  FINDINGS: No PICC line is visualized within the chest or shoulder regions bilaterally.  There is cardiomegaly. Prior median sternotomy and valve replacement. Mild vascular congestion.  Left lower lobe airspace opacity again noted, unchanged. No visible effusions.  IMPRESSION: No visible PICC line.  Stable cardiomegaly and vascular congestion.  Stable left lower lobe atelectasis or consolidation/pneumonia.   Electronically Signed   By: Rolm Baptise M.D.   On: 09/17/2013 04:08   Dg Chest [facility] [address]  09/13/2013   CLINICAL DATA:  Respiratory distress  EXAM: PORTABLE CHEST - 1 VIEW  COMPARISON:  DG CHEST 1V PORT dated 09/08/2013  FINDINGS: Evidence of CABG reidentified with moderate enlargement of the cardiac silhouette and central vascular congestion. Retrocardiac opacity persists. No overt alveolar edema or other filling processes otherwise identified. Trace if any left pleural fluid. No acute osseous finding.  IMPRESSION: Cardiomegaly with central vascular congestion and persistent retrocardiac atelectasis or possibly early pneumonia.   Electronically Signed   By: Conchita Paris M.D.   On: 09/13/2013 13:06   Dg Chest Port 1 View  09/08/2013   CLINICAL DATA:  Chest pain, hypertension  EXAM: PORTABLE CHEST - 1 VIEW  COMPARISON:  September 06, 2013  FINDINGS: The heart size and mediastinal contours are stable. Patient is status post prior CABG. The heart size is enlarged. Patchy consolidation of left lung base is identified. There is mild atelectasis of right lung base. There is no pulmonary edema. There is probable small left pleural effusion. The visualized skeletal structures are stable.  IMPRESSION: Early pneumonia of left lung base with small left pleural effusion.   Electronically Signed   By: Abelardo Diesel M.D.   On: 09/08/2013 23:41   Dg Abd Portable 2v  09/18/2013   CLINICAL DATA:  58 year old male with abdominal distension. Possible small bowel obstruction. Initial encounter.  EXAM: PORTABLE ABDOMEN - 2 VIEW  COMPARISON:  09/17/2013 and earlier.  FINDINGS: Supine and portable cross-table lateral views of the abdomen. Decreased gaseous distension of small bowel. Numerous gas-filled  mostly nondilated loops persist. Persistent right colon gas. No pneumoperitoneum identified. Mild motion artifact on both views. Stable visualized osseous structures.  IMPRESSION: Improved bowel gas pattern with decreased gaseous distension of small bowel and persistent colon gas. Pattern could reflect improving small bowel obstruction or ileus.  No free air identified.   Electronically Signed   By: Lars Pinks M.D.   On: 09/18/2013 17:50    CBC  Recent Labs Lab 09/24/13 0050 09/25/13 0452 09/26/13 0404 09/27/13 0505 09/28/13 0700  WBC 12.1* 11.5* 16.8* 16.1* 18.3*  HGB 9.0* 7.9* 8.1* 8.9* 8.5*  HCT 25.5* 22.4* 23.2* 25.6* 24.5*  PLT 255 243 272 274 310  MCV 82.0 82.7 83.2 83.7 83.9  MCH 28.9 29.2 29.0 29.1 29.1  MCHC 35.3 35.3 34.9 34.8 34.7  RDW 17.2* 17.3* 17.1* 17.4* 17.4*    Chemistries   Recent Labs Lab 09/24/13 0050 09/25/13 0452 09/26/13 0404 09/27/13 0505 09/28/13 0700  NA 135* 137 136* 140 139  K 3.8 4.0 4.2 3.5* 4.0  CL 93* 97 96 97 99  CO2 20 25 24 28 24   GLUCOSE 105* 122* 105* 111* 103*  BUN 41* 20 27* 15 23  CREATININE 7.51* 5.19* 7.32* 4.71* 6.92*  CALCIUM 7.7* 7.8* 8.2* 8.6 8.4  AST 23 22  --   --   --   ALT 19 16  --   --   --   ALKPHOS 54 50  --   --   --   BILITOT 0.9 0.6  --   --   --    ------------------------------------------------------------------------------------------------------------------ estimated creatinine clearance is 14.7 ml/min (by C-G formula based on Cr of 6.92). ------------------------------------------------------------------------------------------------------------------ No results found for this basename: HGBA1C,  in the last 72 hours ------------------------------------------------------------------------------------------------------------------ No results found for this basename: CHOL, HDL, LDLCALC, TRIG, CHOLHDL, LDLDIRECT,  in the last 72  hours ------------------------------------------------------------------------------------------------------------------ No results found for this basename: TSH, T4TOTAL, FREET3, T3FREE, THYROIDAB,  in  the last 72 hours ------------------------------------------------------------------------------------------------------------------  Recent Labs  09/26/13 1359  TIBC 86*  IRON 20*    Coagulation profile  Recent Labs Lab 09/24/13 0050 09/25/13 0452 09/26/13 0404 09/27/13 0505 09/28/13 0700  INR 1.55* 1.43 1.29 1.36 1.33    No results found for this basename: DDIMER,  in the last 72 hours  Cardiac Enzymes No results found for this basename: CK, CKMB, TROPONINI, MYOGLOBIN,  in the last 168 hours ------------------------------------------------------------------------------------------------------------------ No components found with this basename: POCBNP,     Kristel Durkee D.O. on 09/28/2013 at 9:09 AM  Between 7am to 7pm - Pager - [phone]  After 7pm go to [email or web address] - password [email or web address]  And look for the night coverage person covering for me after hours  [facility] Office  [phone]

## 2013-09-28 NOTE — Progress Notes (Signed)
Subjective:   Feeling well. No complaints.   Objective Filed Vitals:   09/27/13 1700 09/27/13 2347 09/28/13 0441 09/28/13 0808  BP: 120/84 133/84 147/92 132/94  Pulse: 102 104 97 112  Temp: 98.4 F (36.9 C) 98.9 F (37.2 C) 98.6 F (37 C) 98.6 F (37 C)  TempSrc: Oral Oral Oral Axillary  Resp: 19 17 16 18   Height:      Weight:  98 kg (216 lb 0.8 oz)    SpO2: 99% 97% 100% 100%   Physical Exam General: Alert and interactive. NAD. bilat hands with posey mitts. Calm today Heart: RRR 2/6 murmur Lungs: CTA unlabored Abdomen: soft, nontender. +BS Extremities: trace bilat ankle edema Dialysis Access: RIJ perm cath placed 2/26   Assessment:  1 Acute kidney injury- oliguric ATN from gent vs immune-complex disease from infection; 1st HD 2/18, cont MWF dialysis; has daily KCl 20 ordered - need to watch - K+ 4. 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 Tmax 98.6 WBC 11.5 -->18.3 today  3 AMS- improved But still oriented to person only and not getting up OOB yet 4 HTN/ vol excess- 132/94 cont to lower vol w HD. No BP meds  5 Hyperphosphatemia- Ca+ 8.4 Phos 8.2 renagel ac  6. Anemia- hgb 8.5- start Aranesp 60 today - Tsat 23- start low dose Iron next HD  7. Nutrition- Alb 1.4 renal diet Multivit. Nepro. Encourage protien  8 Debility- L hemiparesis    Shelle Iron, NP [place] [phone] 09/28/2013,10:34 AM  LOS: 20 days   Pt seen, examined, agree w assess/plan as above with additions as indicated.  Patient's neuro status is not improving mcuh , may be that we need to consider LTAC placement.  He is showing no signs of renal recovery at this point either, this is 11th day on RRT.    Kelly Splinter MD pager [phone]    cell [phone] 09/28/2013, 2:38 PM      Additional Objective Labs: Basic Metabolic  Panel:  Recent Labs Lab 09/22/13 2010  09/26/13 0404 09/27/13 0505 09/28/13 0700  NA 134*  < > 136* 140 139  K 3.0*  < > 4.2 3.5* 4.0  CL 93*  < > 96 97 99  CO2 19  < > 24 28 24   GLUCOSE 132*  < > 105* 111* 103*  BUN 55*  < > 27* 15 23  CREATININE 9.36*  < > 7.32* 4.71* 6.92*  CALCIUM 7.5*  < > 8.2* 8.6 8.4  PHOS 8.2*  --   --   --   --   < > = values in this interval not displayed. Liver Function Tests:  Recent Labs Lab 09/22/13 2010 09/24/13 0050 09/25/13 0452  AST  --  23 22  ALT  --  19 16  ALKPHOS  --  54 50  BILITOT  --  0.9 0.6  PROT  --  5.9* 5.7*  ALBUMIN 1.2* 1.3* 1.4*   No results found for this basename: LIPASE, AMYLASE,  in the last 168 hours CBC:  Recent Labs Lab 09/24/13 0050 09/25/13 0452 09/26/13 0404 09/27/13 0505 09/28/13 0700  WBC 12.1* 11.5* 16.8* 16.1* 18.3*  HGB 9.0* 7.9* 8.1* 8.9* 8.5*  HCT 25.5* 22.4* 23.2* 25.6* 24.5*  MCV 82.0 82.7 83.2 83.7 83.9  PLT 255 243 272 274 310   Blood Culture  Component Value Date/Time   SDES BLOOD LEFT HAND 09/24/2013 0050   SPECREQUEST BOTTLES DRAWN AEROBIC ONLY 4CC 09/24/2013 0050   CULT  Value:        BLOOD CULTURE RECEIVED NO GROWTH TO DATE CULTURE WILL BE HELD FOR 5 DAYS BEFORE ISSUING A FINAL NEGATIVE REPORT Performed at [facility] 09/24/2013 0050   REPTSTATUS PENDING 09/24/2013 0050    Cardiac Enzymes: No results found for this basename: CKTOTAL, CKMB, CKMBINDEX, TROPONINI,  in the last 168 hours CBG:  Recent Labs Lab 09/27/13 0841 09/27/13 1121 09/27/13 1631 09/27/13 2343 09/28/13 0806  GLUCAP 87 108* 131* 108* 97   Iron Studies:  Recent Labs  09/26/13 1359  IRON 20*  TIBC 86*   @[email or web address] @ Studies/Results: No results found. Medications:   . antiseptic oral rinse  15 mL Mouth Rinse q12n4p  . aspirin EC  81 mg Oral Daily  . chlorhexidine  15 mL Mouth Rinse BID  . feeding supplement (RESOURCE BREEZE)  1 Container Oral TID WC  . [START ON 09/29/2013] ferric  gluconate (FERRLECIT/NULECIT) IV  125 mg Intravenous Weekly  . heparin subcutaneous  5,000 Units Subcutaneous 3 times per day  . insulin aspart  0-9 Units Subcutaneous TID WC  . levothyroxine  200 mcg Oral QAC breakfast  . multivitamin  1 tablet Oral QHS  . nafcillin IV  2 g Intravenous Q4H  . pantoprazole  40 mg Oral Q1200  . potassium chloride  20 mEq Oral Daily  . rifampin  600 mg Oral Daily  . sevelamer carbonate  1,600 mg Oral TID WC  . sodium chloride  3 mL Intravenous Q12H

## 2013-09-29 LAB — RENAL FUNCTION PANEL
Albumin: 1.4 g/dL — ABNORMAL LOW (ref 3.5–5.2)
BUN: 32 mg/dL — ABNORMAL HIGH (ref 6–23)
CO2: 24 mEq/L (ref 19–32)
Calcium: 8.2 mg/dL — ABNORMAL LOW (ref 8.4–10.5)
Chloride: 95 mEq/L — ABNORMAL LOW (ref 96–112)
Creatinine, Ser: 8.69 mg/dL — ABNORMAL HIGH (ref 0.50–1.35)
GFR calc Af Amer: 7 mL/min — ABNORMAL LOW (ref >90)
GFR calc non Af Amer: 6 mL/min — ABNORMAL LOW (ref >90)
Glucose, Bld: 121 mg/dL — ABNORMAL HIGH (ref 70–99)
Phosphorus: 7.8 mg/dL — ABNORMAL HIGH (ref 2.3–4.6)
Potassium: 3.3 mEq/L — ABNORMAL LOW (ref 3.7–5.3)
Sodium: 136 mEq/L — ABNORMAL LOW (ref 137–147)

## 2013-09-29 LAB — CBC
HCT: 24.9 % — ABNORMAL LOW (ref 39.0–52.0)
Hemoglobin: 8.6 g/dL — ABNORMAL LOW (ref 13.0–17.0)
MCH: 29.3 pg (ref 26.0–34.0)
MCHC: 34.5 g/dL (ref 30.0–36.0)
MCV: 84.7 fL (ref 78.0–100.0)
Platelets: 317 10*3/uL (ref 150–400)
RBC: 2.94 MIL/uL — ABNORMAL LOW (ref 4.22–5.81)
RDW: 17.4 % — ABNORMAL HIGH (ref 11.5–15.5)
WBC: 16.4 10*3/uL — ABNORMAL HIGH (ref 4.0–10.5)

## 2013-09-29 LAB — BASIC METABOLIC PANEL
BUN: 30 mg/dL — ABNORMAL HIGH (ref 6–23)
CO2: 23 mEq/L (ref 19–32)
Calcium: 8.4 mg/dL (ref 8.4–10.5)
Chloride: 93 mEq/L — ABNORMAL LOW (ref 96–112)
Creatinine, Ser: 8.51 mg/dL — ABNORMAL HIGH (ref 0.50–1.35)
GFR calc Af Amer: 7 mL/min — ABNORMAL LOW (ref >90)
GFR calc non Af Amer: 6 mL/min — ABNORMAL LOW (ref >90)
Glucose, Bld: 139 mg/dL — ABNORMAL HIGH (ref 70–99)
Potassium: 3.5 mEq/L — ABNORMAL LOW (ref 3.7–5.3)
Sodium: 133 mEq/L — ABNORMAL LOW (ref 137–147)

## 2013-09-29 LAB — GLUCOSE, CAPILLARY
Glucose-Capillary: 93 mg/dL (ref 70–99)
Glucose-Capillary: 175 mg/dL — ABNORMAL HIGH (ref 70–99)
Glucose-Capillary: 86 mg/dL (ref 70–99)

## 2013-09-29 LAB — PROTIME-INR
INR: 1.26 (ref 0.00–1.49)
Prothrombin Time: 15.5 seconds — ABNORMAL HIGH (ref 11.6–15.2)

## 2013-09-29 MED ORDER — LIDOCAINE-PRILOCAINE 2.5-2.5 % EX CREA: 1.0000 "application " | TOPICAL_CREAM | CUTANEOUS | Status: DC | PRN

## 2013-09-29 MED ORDER — SODIUM CHLORIDE 0.9 % IV SOLN: 100.0000 mL | INTRAVENOUS | Status: DC | PRN

## 2013-09-29 MED ORDER — HEPARIN SODIUM (PORCINE) 1000 UNIT/ML DIALYSIS: 1000.0000 [IU] | INTRAMUSCULAR | Status: DC | PRN

## 2013-09-29 MED ORDER — ALTEPLASE 2 MG IJ SOLR: 2.0000 mg | Freq: Once | INTRAMUSCULAR | Status: DC | PRN

## 2013-09-29 MED ORDER — SEVELAMER CARBONATE 0.8 G PO PACK
1.6000 g | PACK | Freq: Three times a day (TID) | ORAL | Status: DC
  Administered 2013-09-29 – 2013-10-09 (×18): 1.6 g via ORAL
  Filled 2013-09-29 (×35): qty 2

## 2013-09-29 MED ORDER — LIDOCAINE HCL (PF) 1 % IJ SOLN: 5.0000 mL | INTRAMUSCULAR | Status: DC | PRN

## 2013-09-29 MED ORDER — NEPRO/CARBSTEADY PO LIQD
237.0000 mL | ORAL | Status: DC | PRN
Start: 2013-09-29 — End: 2013-09-29

## 2013-09-29 MED ORDER — PENTAFLUOROPROP-TETRAFLUOROETH EX AERO: 1.0000 "application " | INHALATION_SPRAY | CUTANEOUS | Status: DC | PRN

## 2013-09-29 MED ORDER — BOOST / RESOURCE BREEZE PO LIQD
1.0000 | Freq: Two times a day (BID) | ORAL | Status: DC
  Administered 2013-09-30 – 2013-10-09 (×9): 1 via ORAL

## 2013-09-29 NOTE — Progress Notes (Signed)
NUTRITION FOLLOW UP  Intervention:   Continue Resource Breeze, decrease to BID with meals. RD discussed renal friendly meal/snack ideas for family to bring. RD to continue to follow nutrition care plan.  Nutrition Dx:   Inadequate oral intake related to poor appetite AEB limited po intake.  Goal:   Pt to meet >/= 90% of their estimated nutrition needs; unmet  Monitor:   Weight, Labs, Intake  Assessment:   58 year old male with s/p [place] with bioprosthetic AV in august 2011. 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 Admitted 09/08/2013 with encephalopathy and septic picture with imaging showing septic embolli of brain and cultures revelaing MSSA bacteremia. Concern is for MSSA endocarditis but TEE unable to be done due to encephalopathy, and thrombocytopenia. On 09/13/13 develping renal failure (d2 of gent stopped) and worsening encephalopathy.  Continues on Renal/CHO Modified medium meals.  Currently eating about 50% of meals. Family bringing food, pt reports that he does not like hospital type foods. Pt confused at this time.  Perm cath placed 2/26.  Renal following; first HD session 2/18. Continues on HD MWF.  Height: Ht Readings from Last 1 Encounters:  09/24/13 6\' 1"  (1.854 m)    Weight Status:   Wt Readings from Last 1 Encounters:  09/29/13 212 lb 4.9 oz (96.3 kg)  Admit wt 200 lb  Re-estimated needs:  Kcal: 2200-2400  Protein: 80-90 g  Fluid: per MD  Skin: +1 RUE, LUE, RLE, and LLE edema; intact  Diet Order: Diabetic   Intake/Output Summary (Last 24 hours) at 09/29/13 1423 Last data filed at 09/29/13 1233  Gross per 24 hour  Intake    460 ml  Output   3374 ml  Net  -2914 ml    Last BM: 3/1  Labs:   Recent Labs Lab 09/22/13 2010  09/28/13 0700 09/29/13 0410 09/29/13 0814  NA 134*  < > 139 133* 136*  K 3.0*  < > 4.0 3.5* 3.3*  CL 93*  < > 99 93* 95*  CO2 19  < > 24 23 24   BUN 55*  < > 23  30* 32*  CREATININE 9.36*  < > 6.92* 8.51* 8.69*  CALCIUM 7.5*  < > 8.4 8.4 8.2*  PHOS 8.2*  --   --   --  7.8*  GLUCOSE 132*  < > 103* 139* 121*  < > = values in this interval not displayed.  CBG (last 3)   Recent Labs  09/28/13 1707 09/28/13 2111 09/29/13 1253  GLUCAP 96 108* 93    Scheduled Meds: . antiseptic oral rinse  15 mL Mouth Rinse q12n4p  . aspirin EC  81 mg Oral Daily  . chlorhexidine  15 mL Mouth Rinse BID  . feeding supplement (RESOURCE BREEZE)  1 Container Oral TID WC  . ferric gluconate (FERRLECIT/NULECIT) IV  125 mg Intravenous Weekly  . heparin subcutaneous  5,000 Units Subcutaneous 3 times per day  . insulin aspart  0-9 Units Subcutaneous TID WC  . levothyroxine  200 mcg Oral QAC breakfast  . multivitamin  1 tablet Oral QHS  . nafcillin IV  2 g Intravenous Q4H  . pantoprazole  40 mg Oral Q1200  . potassium chloride  20 mEq Oral Daily  . rifampin  600 mg Oral Daily  . sevelamer carbonate  1.6 g Oral TID WC  . sodium chloride  3 mL Intravenous Q12H    Continuous Infusions:  Inda Coke MS, RD, LDN Inpatient Registered Dietitian Pager: [phone] After-hours pager: [phone]

## 2013-09-29 NOTE — Procedures (Signed)
Stable without hemodynamic issues. Working on getting  Pt to appropriate EDW.  Using PC.  Assessment:  1 Acute kidney injury- oliguric ATN from gent vs immune-complex disease from infection; 1st HD 2/18, cont MWF dialysis; has daily KCl 20 ordered - need to watch - K+ 4. HD pending tomorrow  2 Staph aureus bacteremia / embolic strokes / bioprosthetic AVR 2011- suspected endocarditis, Initial blood cultures MSSA,  3 AMS- improved But still oriented to person only and not getting up OOB yet  4 HTN/ vol excess- 132/94 cont to lower vol w HD. No BP meds  5 Hyperphosphatemia- Ca+ 8.4 Phos 8.2 renagel ac  6. Anemia- hgb 8.5- start Aranesp 60 today - Tsat 23-  low dose Iron  7. Nutrition- Alb 1.4 renal diet Multivit. Nepro. Encourage protien  8 Debility- L hemiparesis   Saber Dickerman C

## 2013-09-29 NOTE — Progress Notes (Signed)
    [facility] for Infectious Disease  Date of Admission:  09/08/2013  Antibiotics:  Subjective: No acute events  Objective: Temp:  [97.8 F (36.6 C)-99.9 F (37.7 C)] 98.2 F (36.8 C) (03/02 1251) Pulse Rate:  [94-105] 101 (03/02 1251) Resp:  [17-26] 18 (03/02 1251) BP: (120-157)/(73-99) 129/73 mmHg (03/02 1251) SpO2:  [93 %-100 %] 93 % (03/02 1251) Weight:  [212 lb 4.9 oz (96.3 kg)-220 lb 7.4 oz (100 kg)] 212 lb 4.9 oz (96.3 kg) (03/02 1233)  General: in chair, nad Skin: no rashes Lungs: CTA B Cor: RRR Abdomen: soft, nt, nd Ext:no edema  Lab Results Lab Results  Component Value Date   WBC 16.4* 09/29/2013   HGB 8.6* 09/29/2013   HCT 24.9* 09/29/2013   MCV 84.7 09/29/2013   PLT 317 09/29/2013    Lab Results  Component Value Date   CREATININE 8.69* 09/29/2013   BUN 32* 09/29/2013   NA 136* 09/29/2013   K 3.3* 09/29/2013   CL 95* 09/29/2013   CO2 24 09/29/2013    Lab Results  Component Value Date   ALT 16 09/25/2013   AST 22 09/25/2013   ALKPHOS 50 09/25/2013   BILITOT 0.6 09/25/2013      Microbiology: Recent Results (from the past 240 hour(s))  CULTURE, BLOOD (ROUTINE X 2)     Status: None   Collection Time    09/24/13 12:44 AM      Result Value Ref Range Status   Specimen Description BLOOD RIGHT ARM   Final   Special Requests BOTTLES DRAWN AEROBIC ONLY 6CC   Final   Culture  Setup Time     Final   Value: 09/24/2013 03:21     Performed at [facility]   Culture     Final   Value:        BLOOD CULTURE RECEIVED NO GROWTH TO DATE CULTURE WILL BE HELD FOR 5 DAYS BEFORE ISSUING A FINAL NEGATIVE REPORT     Performed at [facility]   Report Status PENDING   Incomplete  CULTURE, BLOOD (ROUTINE X 2)     Status: None   Collection Time    09/24/13 12:50 AM      Result Value Ref Range Status   Specimen Description BLOOD LEFT HAND   Final   Special Requests BOTTLES DRAWN AEROBIC ONLY 4CC   Final   Culture  Setup Time     Final   Value: 09/24/2013 03:21   Performed at [facility]   Culture     Final   Value:        BLOOD CULTURE RECEIVED NO GROWTH TO DATE CULTURE WILL BE HELD FOR 5 DAYS BEFORE ISSUING A FINAL NEGATIVE REPORT     Performed at [facility]   Report Status PENDING   Incomplete  CLOSTRIDIUM DIFFICILE BY PCR     Status: None   Collection Time    09/27/13  5:07 PM      Result Value Ref Range Status   C difficile by pcr NEGATIVE  NEGATIVE Final    Studies/Results: No results found.  Assessment/Plan: 1) MSSA bacteremia and presumed endocarditis - TEE when able.  Repeat blood cultures remained negative.  Continue with nafcillin and rifampin. No leukopenia or thrombocytopenia.  WBC stable.   Scharlene Gloss, [place] [place] [email or web address] [phone] pager   [phone] cell 09/29/2013, 3:36 PM

## 2013-09-29 NOTE — Progress Notes (Signed)
Physical Therapy Treatment Patient Details Name: Bradley [name] MRN: [id number] DOB: 1956/04/02 Today's Date: 09/29/2013 Time: [id number] PT Time Calculation (min): 29 min  PT Assessment / Plan / Recommendation  History of Present Illness Pt admit with sepsis and r/o endocarditis.  Anoxic brain injury.   PT Comments   Patient presents with improved awareness, able to state need to use the bathroom while walking in hallway.  Also able to ambulate out of the room with HR max 124.  Wife in the room to provide support and encourage participation.  Good CIR candidate needing interdisciplinary rehab for maximal independence prior to d/c home with family.  Follow Up Recommendations  CIR;Supervision/Assistance - 24 hour     Does the patient have the potential to tolerate intense rehabilitation   Yes  Barriers to Discharge  None      Equipment Recommendations  Other (comment) (TBA)    Recommendations for Other Services Rehab consult  Frequency Min 3X/week   Progress towards PT Goals Progress towards PT goals: Progressing toward goals  Plan Current plan remains appropriate    Precautions / Restrictions Precautions Precautions: Fall Restrictions Weight Bearing Restrictions: No   Pertinent Vitals/Pain No pain compliants    Mobility  Bed Mobility Overal bed mobility: Needs Assistance Bed Mobility: Supine to Sit Supine to sit: Mod assist General bed mobility comments: assist to lift trunk and move feet off bed Transfers Overall transfer level: Needs assistance Equipment used: Rolling walker (2 wheeled) Transfers: Sit to/from Stand Sit to Stand: Mod assist Stand pivot transfers: Mod assist General transfer comment: increased time for initiation and cues; lifting assist from bed and [facility] Ambulation/Gait Ambulation/Gait assistance: Min assist;+2 safety/equipment (one following with chair) Ambulation Distance (Feet): 40 Feet Assistive device: Rolling walker (2 wheeled) Gait  Pattern/deviations: Decreased stride length;Leaning posteriorly General Gait Details: leans back due to core/hip weakness; able to walk with assist to maneuver walker and cues for walker safety      PT Goals (current goals can now be found in the care plan section)    Visit Information  Last PT Received On: 09/29/13 Assistance Needed: +2 (for safety with ambulation) History of Present Illness: Pt admit with sepsis and r/o endocarditis.  Anoxic brain injury.    Subjective Data      Cognition  Cognition Arousal/Alertness: Awake/alert Behavior During Therapy: Flat affect Overall Cognitive Status: Impaired/Different from baseline Area of Impairment: Attention;Memory;Safety/judgement;Problem solving Current Attention Level: Sustained Following Commands: Follows one step commands with increased time Safety/Judgement: Decreased awareness of deficits Awareness: Intellectual Problem Solving: Slow processing;Decreased initiation;Difficulty sequencing;Requires verbal cues;Requires tactile cues Rancho Levels of Cognitive Functioning Rancho Los Amigos Scales of Cognitive Functioning: Confused/appropriate    Balance  Balance Overall balance assessment: Needs assistance Standing balance support: Bilateral upper extremity supported Standing balance-Leahy Scale: Poor Standing balance comment: UE assist needed for balance  End of Session PT - End of Session Equipment Utilized During Treatment: Gait belt Activity Tolerance: Other (comment) (needing to use the bathroom) Patient left: in chair;with chair alarm set;with family/visitor present;with call bell/phone within reach   GP     [facility] 09/29/2013, 4:20 PM [place], [place] 09/29/2013

## 2013-09-29 NOTE — Progress Notes (Addendum)
[facility]                                                                              Patient Demographics  Bradley [name], is a 58 y.o. male, DOB - Jul 25, 1956, [id number]  Admit date - 09/08/2013   Admitting Physician Berle Mull, MD  Outpatient Primary MD for the patient is [name], MD  LOS - 21   Chief Complaint  Patient presents with  . Altered Mental Status      Brief narrative:  58 year old male with history of aortic stenosis, status post pediatric aortic valvulotomy at age 45 followed by tissue AVR (Bentall procedure) 02/2000 with cardiac cath 02/2010 with normal coronaries, chronic LBBB, thyroid cancer post thyroidectomy >> hypothyroidism, HTN, and depression, who had been generally unwell for 2-3 weeks. He was seen in the ED on 09/06/13 and assessed as influenza related vomiting, diarrhea, abdominal pain, body aches, fever and mild confusion and was ultimately discharged on Tamiflu. Patient worsened at home with progressive confusion/disorientation and was admitted with sepsis, septic brain emboli and rule out infective endocarditis.   Assessment & Plan   Severe sepsis secondary to MSSA bacteremia w/ sepsis / infective endocarditis w/ cerebral septic emboli / UTI  -Blood cultures x 2 MSSA - follow up cultures negative, however on antibiotics -Chest x-ray suggested early pneumonia of left lung base with small left pleural effusion  -UA cx with MSSA UTI  -TTE (09/09/13) without vegetation  -Cardiology recommends 6 weeks of antibiotics for presumed endocarditis, no TEE at this time -has prior tissue AVR  -ID directing antibiotics, currently on naficillin and rifampin -Patient continues to have low grade fever, however, leukocytosis trending downward -Cdiff PCR negative on 2/16 and 09/16/13 -Blood cultures from 09/24/13, show no growth to date  Toxic metabolic encephalopathy  -secondary to severe sepsis, azotemia, and septic brain emboli  -MRI Brain  (09/09/13) with multiple bilateral supra tentorial and right cerebellar regions consistent with acute partially hemorrhagic infarcts suspected secondary to septic emboli  -Continue supportive care and treat underlying causes  -Neurology has evaluated  -QTc prolonged so haldol not safe  -[id number], folic acid [id number], and ammonia 17   Severe thrombocytopenia  -Resolved, Platelet counts dropped to a nadir of 23  -felt to be due to sepsis   Acute Renal Failure/Acute tubular necrosis  -due to bacteremia/immune complex or gentamicin  -baseline renal fnx: 18 / 0.93 -progressive failure since admit -Transferred to [name] 09/15/2013 for hemodialysis  -Hemodialysis started 09/17/2013  -Renal US normal kidneys, no hydronephrosis -Nephrology directing care   Abdominal distention/diarrhea  -distention improved -diarrhea likely due to antibiotics, which has resolved -C diff negative 2/16 and 2/17   Hypokalemia  -will continue K-dur 43mEq daily and continue to monitor   NSTEMI (Type II)  -peak Tropinin 2.26  -Cardiology has no plans for cath at this point  -has stable ST elevation r/t LBBB   Acute Systolic HF and Chronic grade 1 Diastolic CHF  -ECHO Oct [id number] with EF 60-65% with severe LVH and no RWMA  -ECHO Sep 09, 2013 EF 45-50% with septal and apical hypokinesis   History of thyroid cancer, post thyroidectomy hypothyroidism  -  TSH elevated at 10.174  -Synthroid increased  -Will need monitoring and repeat labs in 4-6 weeks   Hypertension  -Holding lisinopril due to acute renal failure  -Continue metoprolol IV PRN  Hyperglycemia  -Hemoglobin A1c 5.7  -Continue ISS and CBG monitoring  History of depression  -hold Zoloft secondary to acute encephalopathy.   Anxiety/Insomnia -Will discontinue ambien -Continue Klonopin QHS PRN, and Ativan daily PRN -Continue to monitor  Hyponatremia  -Resolved, due to volume overload  -corrected after initiation of  hemodialysis  Deconditioning -CIR consulted, and pending evaluation  Code Status: Full  Family Communication: None at bedside  Disposition Plan: Admitted.  Pending CIR evaluation  Time Spent in minutes   20 minutes  Procedures/Course  2/09 - admit  2/10 - ID, Heme and Cards consults. TTE EF 45%. No clear vegetations  2/14 - PCCM consult  2/15 - Junctional rhythm with precedex, mental status improved. Transferred to [facility] for possible HD 2/16 - Renal U/S- normal kidneys, no hydronephrosis  2/18 - transferred to ICU for diaphoresis, ST changes and confusion 2/21 - patient pulled out L IJ HD catheter that was placed on 09/17/13 2/21 - L [place] HD catheter placed 2/26 - Conversion of R IJ temp cath to [facility] cath  [place]   Nephrology PCCM Infectious disease Interventional radiology Cardiology Neurology Hematology CIR  DVT Prophylaxis Heparin   Lab Results  Component Value Date   PLT 317 09/29/2013    Medications  Scheduled Meds: . antiseptic oral rinse  15 mL Mouth Rinse q12n4p  . aspirin EC  81 mg Oral Daily  . chlorhexidine  15 mL Mouth Rinse BID  . feeding supplement (RESOURCE BREEZE)  1 Container Oral TID WC  . ferric gluconate (FERRLECIT/NULECIT) IV  125 mg Intravenous Weekly  . heparin subcutaneous  5,000 Units Subcutaneous 3 times per day  . insulin aspart  0-9 Units Subcutaneous TID WC  . levothyroxine  200 mcg Oral QAC breakfast  . multivitamin  1 tablet Oral QHS  . nafcillin IV  2 g Intravenous Q4H  . pantoprazole  40 mg Oral Q1200  . potassium chloride  20 mEq Oral Daily  . rifampin  600 mg Oral Daily  . sevelamer carbonate  1,600 mg Oral TID WC  . sodium chloride  3 mL Intravenous Q12H   Continuous Infusions:  PRN Meds:.sodium chloride, sodium chloride, acetaminophen, acetaminophen, alteplase, clonazePAM, feeding supplement (NEPRO CARB STEADY), heparin, heparin, hydrOXYzine, lidocaine (PF), lidocaine-prilocaine, liver oil-zinc oxide, LORazepam, metoprolol,  morphine injection, ondansetron (ZOFRAN) IV, pentafluoroprop-tetrafluoroeth  Antibiotics    Anti-infectives   Start     Dose/Rate Route Frequency Ordered Stop   09/24/13 1400  rifampin (RIFADIN) capsule 600 mg     600 mg Oral Daily 09/24/13 1254     09/14/13 1400  rifampin (RIFADIN) 300 mg in sodium chloride 0.9 % 100 mL IVPB  Status:  Discontinued     300 mg 200 mL/hr over 30 Minutes Intravenous 3 times per day 09/14/13 1210 09/24/13 1254   09/11/13 1800  gentamicin (GARAMYCIN) 90 mg in dextrose 5 % 50 mL IVPB  Status:  Discontinued     90 mg 104.5 mL/hr over 30 Minutes Intravenous Every 12 hours 09/11/13 0932 09/13/13 0802   09/09/13 1800  nafcillin 2 g in dextrose 5 % 50 mL IVPB     2 g 100 mL/hr over 30 Minutes Intravenous Every 4 hours 09/09/13 1415     09/09/13 1430  nafcillin 2 g in dextrose 5 % 50  mL IVPB     2 g 100 mL/hr over 30 Minutes Intravenous  Once 09/09/13 1415 09/09/13 1603   09/09/13 1415  nafcillin injection 2 g  Status:  Discontinued     2 g Intravenous Every 4 hours 09/09/13 1410 09/09/13 1413   09/09/13 0800  vancomycin (VANCOCIN) IVPB 1000 mg/200 mL premix  Status:  Discontinued     1,000 mg 200 mL/hr over 60 Minutes Intravenous Every 8 hours 09/09/13 0341 09/11/13 0829   09/09/13 0500  gentamicin (GARAMYCIN) 90 mg in dextrose 5 % 50 mL IVPB  Status:  Discontinued     1 mg/kg  90.7 kg 104.5 mL/hr over 30 Minutes Intravenous 3 times per day 09/09/13 0340 09/11/13 0932   09/09/13 0400  ceFEPIme (MAXIPIME) 2 g in dextrose 5 % 50 mL IVPB  Status:  Discontinued     2 g 100 mL/hr over 30 Minutes Intravenous 3 times per day 09/09/13 0340 09/09/13 1410   09/08/13 2315  piperacillin-tazobactam (ZOSYN) IVPB 3.375 g     3.375 g 100 mL/hr over 30 Minutes Intravenous  Once 09/08/13 2307 09/09/13 0043   09/08/13 2315  vancomycin (VANCOCIN) IVPB 1000 mg/200 mL premix     1,000 mg 200 mL/hr over 60 Minutes Intravenous  Once 09/08/13 2307 09/09/13 0230         Subjective:   Bradley [name] seen and examined today.  Patient is oriented to self and place.  He currently has no complaints.    Objective:   Filed Vitals:   09/29/13 1100 09/29/13 1130 09/29/13 1200 09/29/13 1224  BP: 120/73 127/84 136/74 152/91  Pulse: 103 98 100 100  Temp:      TempSrc:      Resp: 24 21 23 24   Height:      Weight:      SpO2:        Wt Readings from Last 3 Encounters:  09/29/13 99.6 kg (219 lb 9.3 oz)  09/06/13 90.719 kg (200 lb)  05/07/13 94.348 kg (208 lb)     Intake/Output Summary (Last 24 hours) at 09/29/13 1230 Last data filed at 09/29/13 0700  Gross per 24 hour  Intake    460 ml  Output      4 ml  Net    456 ml    Exam  General: Well developed, well nourished, NAD, appears stated age  65: NCAT, mucous membranes moist.   Neck: Supple, no JVD, no masses  Cardiovascular: S1 S2 auscultated, 2/6 SEM, Regular rate and rhythm.  Respiratory: Clear to auscultation bilaterally with equal chest rise  Abdomen: Soft, nontender, nondistended, + bowel sounds  Extremities: warm dry without cyanosis clubbing.  Neuro: Awake and alert, oriented to place, not time.  Skin: Without rashes exudates or nodules  Data Review   Micro Results Recent Results (from the past 240 hour(s))  CULTURE, BLOOD (ROUTINE X 2)     Status: None   Collection Time    09/24/13 12:44 AM      Result Value Ref Range Status   Specimen Description BLOOD RIGHT ARM   Final   Special Requests BOTTLES DRAWN AEROBIC ONLY [facility]   Final   Culture  Setup Time     Final   Value: 09/24/2013 03:21     Performed at [facility]   Culture     Final   Value:        BLOOD CULTURE RECEIVED NO GROWTH TO DATE CULTURE WILL BE HELD FOR 5  DAYS BEFORE ISSUING A FINAL NEGATIVE REPORT     Performed at [facility]   Report Status PENDING   Incomplete  CULTURE, BLOOD (ROUTINE X 2)     Status: None   Collection Time    09/24/13 12:50 AM      Result Value Ref Range  Status   Specimen Description BLOOD LEFT HAND   Final   Special Requests BOTTLES DRAWN AEROBIC ONLY 4CC   Final   Culture  Setup Time     Final   Value: 09/24/2013 03:21     Performed at [facility]   Culture     Final   Value:        BLOOD CULTURE RECEIVED NO GROWTH TO DATE CULTURE WILL BE HELD FOR 5 DAYS BEFORE ISSUING A FINAL NEGATIVE REPORT     Performed at [facility]   Report Status PENDING   Incomplete  CLOSTRIDIUM DIFFICILE BY PCR     Status: None   Collection Time    09/27/13  5:07 PM      Result Value Ref Range Status   C difficile by pcr NEGATIVE  NEGATIVE Final    Radiology Reports Dg Chest 2 View  09/06/2013   CLINICAL DATA:  Fever, nausea, vomiting.  EXAM: CHEST  2 VIEW  COMPARISON:  08/17/2011  FINDINGS: Prior median sternotomy and valve replacement. Mild cardiomegaly and vascular congestion. Low lung volumes with bibasilar atelectasis. No effusions. No acute bony abnormality.  IMPRESSION: Cardiomegaly, bibasilar atelectasis.   Electronically Signed   By: Rolm Baptise M.D.   On: 09/06/2013 19:34   Dg Abd 1 View  09/17/2013   CLINICAL DATA:  Abdominal distension  EXAM: ABDOMEN - 1 VIEW  COMPARISON:  CT ABD/PELVIS W CM dated 09/06/2013  FINDINGS: There is at least moderate gas distention of multiple loops of predominantly small bowel, though there may be minimal gaseous distention of the splenic flexure of the colon.  Examination is degraded secondary to the coned field-of-view on solitary provided radiograph.  Nondiagnostic evaluation for pneumoperitoneum secondary supine positioning and exclusion of the lower thorax. No definite pneumatosis or portal venous gas.  No definite abnormal intra-abdominal calcifications.  Degenerative change the lower lumbar spine.  IMPRESSION: Degraded examination with findings concerning for small bowel obstruction though there may be a minimal amount of air within the splenic flexure of the colon. Clinical correlation is advised.  Further evaluation with complete abdominal radiographic series may be performed as clinically indicated.   Electronically Signed   By: Sandi Mariscal M.D.   On: 09/17/2013 10:38   Ct Head Wo Contrast  09/12/2013   CLINICAL DATA:  Increased mental status change, evaluate for CVA versus worsening bleed.  EXAM: CT HEAD WITHOUT CONTRAST  TECHNIQUE: Contiguous axial images were obtained from the base of the skull through the vertex without intravenous contrast.  COMPARISON:  Prior MRI and and CT from 09/09/2013.  FINDINGS: Study is degraded by motion artifact.  Vasogenic edema as trending linear hyperdensity within the left parietal lobe is stable not significantly changed relative to the previous examination. Vague hypodensities involving the supratentorial brain and right cerebellar hemisphere are not significantly changed relative to recent MRI. No definite a new intracranial infarct identified. Previously seen hyperdense foci within the mid bilateral frontal lobes are less conspicuous as compared to prior. No new intracranial hemorrhage. No mass lesion or midline shift. Ventricles are stable in size without evidence of hydrocephalus. No extra-axial fluid collection.  The calvarium  is grossly intact. Orbits are normal. Paranasal sinuses and mastoid air cells remain clear.  IMPRESSION: 1. Limited study due to motion. No significant interval change in subacute left parietal infarct with small amount of associated hemorrhage. Additional multi focal infarcts involving the supratentorial brain and right cerebellar hemisphere are not significantly changed as well relative to recent MRI from 09/09/2013. No definite new intracranial infarct identified. No new intracranial hemorrhage.   Electronically Signed   By: Jeannine Boga M.D.   On: 09/12/2013 22:02   Ct Head Wo Contrast  09/09/2013   CLINICAL DATA:  Fever.  Altered mental status.  EXAM: CT HEAD WITHOUT CONTRAST  TECHNIQUE: Contiguous axial images were obtained  from the base of the skull through the vertex without intravenous contrast.  COMPARISON:  CT HEAD W/O CM dated 09/06/2013; DG CHEST 1V PORT dated 09/08/2013; CT ABD/PELVIS W CM dated 09/06/2013  FINDINGS: Small focus of linear high attenuation in the left parietal lobe appear similar to the prior exam. The amount of edema at has increased compared to the prior exam suggesting a subacute process. This appears wedge-shaped in may represent an embolic infarct. There is a similar faint area of high attenuation in the left frontal lobe on image number 22 low with a more subtle area in the right frontal lobe that is more equivocal. The dural venous sinuses appear within normal limits. No mass lesion or midline shift. No hydrocephalus.  The calvarium is intact. Left-sided nasal septal spur. Mastoid air cells clear. Paranasal sinuses are normal.  IMPRESSION: 1. Increasing edema around linear high attenuation in the left parietal lobe. Followup MRI with and without contrast is recommended for further assessment. The patient presents with an infectious syndrome and septic emboli are in the differential considerations. Infarct with a small amount of hemorrhage or cortical laminar necrosis as well as developmental venous anomaly remain in the differential considerations. MRI should be useful in further characterization. 2. Dr. Leonel Ramsay called discuss the case at 0230 hr on the day of examination.   Electronically Signed   By: Dereck Ligas M.D.   On: 09/09/2013 02:32   Ct Head Wo Contrast  09/06/2013   CLINICAL DATA:  Confusion.  EXAM: CT HEAD WITHOUT CONTRAST  TECHNIQUE: Contiguous axial images were obtained from the base of the skull through the vertex without intravenous contrast.  COMPARISON:  None.  FINDINGS: There is a 1.3 cm curvilinear focus of hyperdensity in the subcortical left parietal lobe (image 25). There is no evidence of surrounding edema. There is no other evidence of acute intracranial hemorrhage. There is  no evidence of acute cortical infarct, mass, midline shift, or extra-axial fluid collection. Subcentimeter low density focus in the left caudate is suggestive of a remote lacunar infarct. Ventricles and sulci are normal. There is very mild mucosal thickening in the frontal sinuses and ethmoid air cells bilaterally. Mastoid air cells are clear. Orbits are unremarkable.  IMPRESSION: 1. Curvilinear hyperdensity in the subcortical left parietal lobe. Acute intracranial hemorrhage cannot be completely excluded, however there is no surrounding edema or evidence of hemorrhage elsewhere in the brain and the patient is not presenting in the setting of acute trauma. Given the curvilinear configuration, this may be vascular and represent an incidental developmental venous anomaly. 2. Remote left caudate lacunar infarct.   Electronically Signed   By: Logan Bores   On: 09/06/2013 22:29   Mr Brain W Wo Contrast  09/09/2013   CLINICAL DATA:  Post aortic valve replacement 2011. Presenting with  altered mental status, elevated white count and abnormal CT.  EXAM: MRI HEAD WITHOUT AND WITH CONTRAST  TECHNIQUE: Multiplanar, multiecho pulse sequences of the brain and surrounding structures were obtained without and with intravenous contrast.  CONTRAST:  42mL MULTIHANCE GADOBENATE DIMEGLUMINE 529 MG/ML IV SOLN  COMPARISON:  09/09/2013 and 09/06/2013 CT.  No comparison MR.  FINDINGS: Exam is motion degraded.  Multiple bilateral supra tentorial and right cerebellar regions of restricted motion some of which are partially hemorrhagic with largest area left parietal lobe. In the present clinical setting, these findings are consistent with acute partially hemorrhagic infarcts and possibly related to septic emboli.  At most there is very minimal enhancement of the left parietal partially hemorrhagic infarct.  No intracranial mass lesion seen separate from the above described findings.  Major intracranial vascular structures are patent.   IMPRESSION: Exam is motion degraded.  Multiple bilateral supra tentorial and right cerebellar regions of restricted motion some of which are partially hemorrhagic with largest area left parietal lobe. In the present clinical setting, these findings are consistent with acute partially hemorrhagic infarcts and possibly related to septic emboli.  These results were called by telephone at the time of interpretation on 09/09/2013 at 11:54 AM to Dr. Algis Liming , who verbally acknowledged these results.   Electronically Signed   By: Chauncey Cruel M.D.   On: 09/09/2013 12:03   Ct Abdomen Pelvis W Contrast  09/06/2013   CLINICAL DATA:  58 year old male abdominal pain, vomiting, diarrhea and low grade fever.  EXAM: CT ABDOMEN AND PELVIS WITH CONTRAST  TECHNIQUE: Multidetector CT imaging of the abdomen and pelvis was performed using the standard protocol following bolus administration of intravenous contrast.  CONTRAST:  100 cc intravenous Omnipaque 300  COMPARISON:  None.  FINDINGS: Cardiomegaly and aortic valve replacement noted. Mild bibasilar atelectasis/scarring is identified.  The liver, spleen, pancreas, gallbladder adrenal glands and kidneys are unremarkable except for small bilateral renal cysts.  There is no evidence of free fluid, enlarged lymph nodes, biliary dilation or abdominal aortic aneurysm.  The bowel, bladder and appendix are unremarkable. There is no evidence of bowel obstruction, abscess or pneumoperitoneum.  A small right inguinal hernia containing fat is identified.  Mild prostate enlargement is present.  No acute or suspicious bony abnormalities are identified.  IMPRESSION: No evidence of acute abnormality.  Cardiomegaly, aortic valve replacement, prostate enlargement and small right inguinal hernias containing fat.   Electronically Signed   By: Hassan Rowan M.D.   On: 09/06/2013 22:32   Ir Veno/ext/uni Right  09/14/2013   CLINICAL DATA:  Poor venous access, request for PICC  EXAM: DUAL LUMEN LEFT MIDLINE  VENOUS CATHETER PLACEMENT WITH ULTRASOUND AND FLUOROSCOPIC GUIDANCE  ADDITIONAL VENOGRAPHY OF THE RIGHT UPPER EXTREMITY  FLUOROSCOPY TIME:  7 MIN AND 30 SECONDS.  PROCEDURE: The patient's family member was advised of the possible risks and complications and agreed to undergo the procedure. The patient was then brought to the angiographic suite for the procedure.  The right and left arm was prepped with chlorhexidine, draped in the usual sterile fashion using maximum barrier technique (cap and mask, sterile gown, sterile gloves, large sterile sheet, hand hygiene and cutaneous antisepsis) and infiltrated locally with 1% Lidocaine.  Ultrasound demonstrated patency of the right and left brachial vein, and this was documented with an image. Under real-time ultrasound guidance, the right brachial vein was accessed with a 21 gauge micropuncture needle and image documentation was performed. A 0.018 wire was introduced in to the vein.  Over this, a dilator introducer was placed, the wire was unable to be advanced proximally so a venogram was performed with 10 cc of Omnipaque, this revealed venous extravasation. The right brachial vein site was abandoned and manual pressure was held until the site was soft with no continued bleeding. The left brachial vein was accessed with a 21 gauge micropuncture needle and multiple guidewires used to advance to the SVC/RA junction. A 5 French dual lumen power-injectable PICC was advanced, however unable to cross proximally to the lower SVC/right atrial junction, the PICC was left midline for this reason. Fluoroscopy during the procedure and fluoro spot radiograph confirms appropriate catheter position. The catheter aspirated and flushed well and was then covered with a sterile dressing.  Complications: Extravasation of 10 cc of Omnipaque to right brachial vein. Unable to advance left brachial vein PICC to the SVC/RA junction, left brachial PICC was placed midline.  IMPRESSION: Successful  left arm midline VENOUS CATHETER placement with ultrasound and fluoroscopic guidance. The catheter is ready for use.  Read By:  Tsosie Billing PA-C   Electronically Signed   By: Aletta Edouard M.D.   On: 09/14/2013 11:51   US Renal  09/15/2013   CLINICAL DATA:  Acute renal failure.  EXAM: RENAL/URINARY TRACT ULTRASOUND COMPLETE  COMPARISON:  CT abdomen and pelvis 09/06/2013.  FINDINGS: Right Kidney:  Length: Approximately 12.7 cm. No hydronephrosis. Well-preserved cortex. No shadowing calculi. Normal parenchymal echotexture without focal abnormalities.  Left Kidney:  Length: Approximately 12.5 cm. No hydronephrosis. Well-preserved cortex. No shadowing calculi. Normal parenchymal echotexture without focal abnormalities.  Bladder:  Decompressed by Foley catheter.  IMPRESSION: Normal kidneys.  Specifically, no evidence of hydronephrosis.   Electronically Signed   By: Evangeline Dakin M.D.   On: 09/15/2013 10:55   Ir Fluoro Guide Cv Line Left  09/14/2013   CLINICAL DATA:  Poor venous access, request for PICC  EXAM: DUAL LUMEN LEFT MIDLINE VENOUS CATHETER PLACEMENT WITH ULTRASOUND AND FLUOROSCOPIC GUIDANCE  ADDITIONAL VENOGRAPHY OF THE RIGHT UPPER EXTREMITY  FLUOROSCOPY TIME:  7 MIN AND 30 SECONDS.  PROCEDURE: The patient's family member was advised of the possible risks and complications and agreed to undergo the procedure. The patient was then brought to the angiographic suite for the procedure.  The right and left arm was prepped with chlorhexidine, draped in the usual sterile fashion using maximum barrier technique (cap and mask, sterile gown, sterile gloves, large sterile sheet, hand hygiene and cutaneous antisepsis) and infiltrated locally with 1% Lidocaine.  Ultrasound demonstrated patency of the right and left brachial vein, and this was documented with an image. Under real-time ultrasound guidance, the right brachial vein was accessed with a 21 gauge micropuncture needle and image documentation was  performed. A 0.018 wire was introduced in to the vein. Over this, a dilator introducer was placed, the wire was unable to be advanced proximally so a venogram was performed with 10 cc of Omnipaque, this revealed venous extravasation. The right brachial vein site was abandoned and manual pressure was held until the site was soft with no continued bleeding. The left brachial vein was accessed with a 21 gauge micropuncture needle and multiple guidewires used to advance to the SVC/RA junction. A 5 French dual lumen power-injectable PICC was advanced, however unable to cross proximally to the lower SVC/right atrial junction, the PICC was left midline for this reason. Fluoroscopy during the procedure and fluoro spot radiograph confirms appropriate catheter position. The catheter aspirated and flushed well and was then covered with  a sterile dressing.  Complications: Extravasation of 10 cc of Omnipaque to right brachial vein. Unable to advance left brachial vein PICC to the SVC/RA junction, left brachial PICC was placed midline.  IMPRESSION: Successful left arm midline VENOUS CATHETER placement with ultrasound and fluoroscopic guidance. The catheter is ready for use.  Read By:  Tsosie Billing PA-C   Electronically Signed   By: Aletta Edouard M.D.   On: 09/14/2013 11:51   Ir Fluoro Guide Cv Line Right  09/21/2013   CLINICAL DATA:  Renal failure  EXAM: IR RIGHT FLOURO GUIDE CV LINE; IR ULTRASOUND GUIDANCE VASC ACCESS RIGHT  MEDICATIONS AND MEDICAL HISTORY: None  ANESTHESIA/SEDATION: None  CONTRAST:  None  FLUOROSCOPY TIME:  18 seconds.  PROCEDURE: The procedure, risks, benefits, and alternatives were explained to the patient. Questions regarding the procedure were encouraged and answered. The patient understands and consents to the procedure.  The right neck was prepped with Betadine in a sterile fashion, and a sterile drape was applied covering the operative field. A sterile gown and sterile gloves were used for the  procedure.  Under sonographic guidance, a micropuncture needle was inserted into the right internal jugular vein and removed over a 018 wire which was up sized to a 3 J. the 20 cm temporary dialysis catheter was advanced over the wire to the right atrium. It was flushed and sewn in place.  COMPLICATIONS: None  FINDINGS: Tip of the temporary dialysis catheter is at the right atrium  IMPRESSION: Successful placement of a temporary right internal jugular vein dialysis catheter.   Electronically Signed   By: Maryclare Bean M.D.   On: 09/21/2013 12:15   Ir US Guide Vasc Access Left  09/14/2013   CLINICAL DATA:  Poor venous access, request for PICC  EXAM: DUAL LUMEN LEFT MIDLINE VENOUS CATHETER PLACEMENT WITH ULTRASOUND AND FLUOROSCOPIC GUIDANCE  ADDITIONAL VENOGRAPHY OF THE RIGHT UPPER EXTREMITY  FLUOROSCOPY TIME:  7 MIN AND 30 SECONDS.  PROCEDURE: The patient's family member was advised of the possible risks and complications and agreed to undergo the procedure. The patient was then brought to the angiographic suite for the procedure.  The right and left arm was prepped with chlorhexidine, draped in the usual sterile fashion using maximum barrier technique (cap and mask, sterile gown, sterile gloves, large sterile sheet, hand hygiene and cutaneous antisepsis) and infiltrated locally with 1% Lidocaine.  Ultrasound demonstrated patency of the right and left brachial vein, and this was documented with an image. Under real-time ultrasound guidance, the right brachial vein was accessed with a 21 gauge micropuncture needle and image documentation was performed. A 0.018 wire was introduced in to the vein. Over this, a dilator introducer was placed, the wire was unable to be advanced proximally so a venogram was performed with 10 cc of Omnipaque, this revealed venous extravasation. The right brachial vein site was abandoned and manual pressure was held until the site was soft with no continued bleeding. The left brachial vein was  accessed with a 21 gauge micropuncture needle and multiple guidewires used to advance to the SVC/RA junction. A 5 French dual lumen power-injectable PICC was advanced, however unable to cross proximally to the lower SVC/right atrial junction, the PICC was left midline for this reason. Fluoroscopy during the procedure and fluoro spot radiograph confirms appropriate catheter position. The catheter aspirated and flushed well and was then covered with a sterile dressing.  Complications: Extravasation of 10 cc of Omnipaque to right brachial vein. Unable to advance left  brachial vein PICC to the SVC/RA junction, left brachial PICC was placed midline.  IMPRESSION: Successful left arm midline VENOUS CATHETER placement with ultrasound and fluoroscopic guidance. The catheter is ready for use.  Read By:  Tsosie Billing PA-C   Electronically Signed   By: Aletta Edouard M.D.   On: 09/14/2013 11:51   Ir US Guide Vasc Access Right  09/21/2013   CLINICAL DATA:  Renal failure  EXAM: IR RIGHT FLOURO GUIDE CV LINE; IR ULTRASOUND GUIDANCE VASC ACCESS RIGHT  MEDICATIONS AND MEDICAL HISTORY: None  ANESTHESIA/SEDATION: None  CONTRAST:  None  FLUOROSCOPY TIME:  18 seconds.  PROCEDURE: The procedure, risks, benefits, and alternatives were explained to the patient. Questions regarding the procedure were encouraged and answered. The patient understands and consents to the procedure.  The right neck was prepped with Betadine in a sterile fashion, and a sterile drape was applied covering the operative field. A sterile gown and sterile gloves were used for the procedure.  Under sonographic guidance, a micropuncture needle was inserted into the right internal jugular vein and removed over a 018 wire which was up sized to a 3 J. the 20 cm temporary dialysis catheter was advanced over the wire to the right atrium. It was flushed and sewn in place.  COMPLICATIONS: None  FINDINGS: Tip of the temporary dialysis catheter is at the right atrium   IMPRESSION: Successful placement of a temporary right internal jugular vein dialysis catheter.   Electronically Signed   By: Maryclare Bean M.D.   On: 09/21/2013 12:15   Dg Chest Port 1 View  09/18/2013   CLINICAL DATA:  Respiratory distress, shortness of breath.  EXAM: PORTABLE CHEST - 1 VIEW  COMPARISON:  09/17/2013  FINDINGS: Prior CABG and valve replacement. Left pass catheterization is in stable position. Cardiomegaly. Left lower lobe opacity, likely atelectasis. No effusions or overt edema.  IMPRESSION: Cardiomegaly.  Left lower lobe atelectasis.   Electronically Signed   By: Rolm Baptise M.D.   On: 09/18/2013 06:27   Dg Chest Port 1 View  09/17/2013   CLINICAL DATA:  Hemodialysis catheter placement  EXAM: PORTABLE CHEST - 1 VIEW  COMPARISON:  DG CHEST 1V PORT dated 09/17/2013; DG CHEST 1V PORT dated 09/13/2013; DG CHEST 1V PORT dated 09/08/2013  FINDINGS: Grossly unchanged enlarged cardiac silhouette and mediastinal contours post median sternotomy and valve replacement. Interval placement of left jugular approach dialysis catheter with tip projected over the superior aspect of the SVC. Lung volumes remain reduced. Pulmonary vasculature remains indistinct. Grossly unchanged perihilar and bibasilar heterogeneous / consolidative opacities, left greater than right. No definite pleural effusion or pneumothorax. Grossly unchanged bones including postsurgical/traumatic deformity of the distal end of the right clavicle.  IMPRESSION: 1. Interval placement of left jugular approach central venous catheter with tip projected over the superior SVC. No pneumothorax. 2. Similar findings of pulmonary edema and perihilar/bibasilar opacities, left greater than right, atelectasis versus infiltrate.   Electronically Signed   By: Sandi Mariscal M.D.   On: 09/17/2013 10:41   Dg Chest Port 1 View  09/17/2013   CLINICAL DATA:  Confirm PICC line placement.  EXAM: PORTABLE CHEST - 1 VIEW  COMPARISON:  09/13/2013  FINDINGS: No PICC line  is visualized within the chest or shoulder regions bilaterally.  There is cardiomegaly. Prior median sternotomy and valve replacement. Mild vascular congestion. Left lower lobe airspace opacity again noted, unchanged. No visible effusions.  IMPRESSION: No visible PICC line.  Stable cardiomegaly and vascular congestion.  Stable  left lower lobe atelectasis or consolidation/pneumonia.   Electronically Signed   By: Rolm Baptise M.D.   On: 09/17/2013 04:08   Dg Chest Port 1 View  09/13/2013   CLINICAL DATA:  Respiratory distress  EXAM: PORTABLE CHEST - 1 VIEW  COMPARISON:  DG CHEST 1V PORT dated 09/08/2013  FINDINGS: Evidence of CABG reidentified with moderate enlargement of the cardiac silhouette and central vascular congestion. Retrocardiac opacity persists. No overt alveolar edema or other filling processes otherwise identified. Trace if any left pleural fluid. No acute osseous finding.  IMPRESSION: Cardiomegaly with central vascular congestion and persistent retrocardiac atelectasis or possibly early pneumonia.   Electronically Signed   By: Conchita Paris M.D.   On: 09/13/2013 13:06   Dg Chest Port 1 View  09/08/2013   CLINICAL DATA:  Chest pain, hypertension  EXAM: PORTABLE CHEST - 1 VIEW  COMPARISON:  September 06, 2013  FINDINGS: The heart size and mediastinal contours are stable. Patient is status post prior CABG. The heart size is enlarged. Patchy consolidation of left lung base is identified. There is mild atelectasis of right lung base. There is no pulmonary edema. There is probable small left pleural effusion. The visualized skeletal structures are stable.  IMPRESSION: Early pneumonia of left lung base with small left pleural effusion.   Electronically Signed   By: Abelardo Diesel M.D.   On: 09/08/2013 23:41   Dg Abd Portable 2v  09/18/2013   CLINICAL DATA:  58 year old male with abdominal distension. Possible small bowel obstruction. Initial encounter.  EXAM: PORTABLE ABDOMEN - 2 VIEW  COMPARISON:   09/17/2013 and earlier.  FINDINGS: Supine and portable cross-table lateral views of the abdomen. Decreased gaseous distension of small bowel. Numerous gas-filled mostly nondilated loops persist. Persistent right colon gas. No pneumoperitoneum identified. Mild motion artifact on both views. Stable visualized osseous structures.  IMPRESSION: Improved bowel gas pattern with decreased gaseous distension of small bowel and persistent colon gas. Pattern could reflect improving small bowel obstruction or ileus.  No free air identified.   Electronically Signed   By: Lars Pinks M.D.   On: 09/18/2013 17:50    CBC  Recent Labs Lab 09/25/13 0452 09/26/13 0404 09/27/13 0505 09/28/13 0700 09/29/13 0410  WBC 11.5* 16.8* 16.1* 18.3* 16.4*  HGB 7.9* 8.1* 8.9* 8.5* 8.6*  HCT 22.4* 23.2* 25.6* 24.5* 24.9*  PLT 243 272 274 310 317  MCV 82.7 83.2 83.7 83.9 84.7  MCH 29.2 29.0 29.1 29.1 29.3  MCHC 35.3 34.9 34.8 34.7 34.5  RDW 17.3* 17.1* 17.4* 17.4* 17.4*    Chemistries   Recent Labs Lab 09/24/13 0050 09/25/13 0452 09/26/13 0404 09/27/13 0505 09/28/13 0700 09/29/13 0410 09/29/13 0814  NA 135* 137 136* 140 139 133* 136*  K 3.8 4.0 4.2 3.5* 4.0 3.5* 3.3*  CL 93* 97 96 97 99 93* 95*  CO2 20 25 24 28 24 23 24   GLUCOSE 105* 122* 105* 111* 103* 139* 121*  BUN 41* 20 27* 15 23 30* 32*  CREATININE 7.51* 5.19* 7.32* 4.71* 6.92* 8.51* 8.69*  CALCIUM 7.7* 7.8* 8.2* 8.6 8.4 8.4 8.2*  AST 23 22  --   --   --   --   --   ALT 19 16  --   --   --   --   --   ALKPHOS 54 50  --   --   --   --   --   BILITOT 0.9 0.6  --   --   --   --   --    ------------------------------------------------------------------------------------------------------------------  estimated creatinine clearance is 11.8 ml/min (by C-G formula based on Cr of 8.69). ------------------------------------------------------------------------------------------------------------------ No results found for this basename: HGBA1C,  in the last  72 hours ------------------------------------------------------------------------------------------------------------------ No results found for this basename: CHOL, HDL, LDLCALC, TRIG, CHOLHDL, LDLDIRECT,  in the last 72 hours ------------------------------------------------------------------------------------------------------------------ No results found for this basename: TSH, T4TOTAL, FREET3, T3FREE, THYROIDAB,  in the last 72 hours ------------------------------------------------------------------------------------------------------------------  Recent Labs  09/26/13 1359  TIBC 86*  IRON 20*    Coagulation profile  Recent Labs Lab 09/25/13 0452 09/26/13 0404 09/27/13 0505 09/28/13 0700 09/29/13 0410  INR 1.43 1.29 1.36 1.33 1.26    No results found for this basename: DDIMER,  in the last 72 hours  Cardiac Enzymes No results found for this basename: CK, CKMB, TROPONINI, MYOGLOBIN,  in the last 168 hours ------------------------------------------------------------------------------------------------------------------ No components found with this basename: POCBNP,     Jeffrey Graefe D.O. on 09/29/2013 at 12:30 PM  Between 7am to 7pm - Pager - [phone]  After 7pm go to [email or web address] - password [email or web address]  And look for the night coverage person covering for me after hours  [facility] Office  [phone]

## 2013-09-29 NOTE — Progress Notes (Signed)
Pt back from dialysis. Vss. Denies pain at this time. Alert and responsive.

## 2013-09-30 DIAGNOSIS — T827XXA Infection and inflammatory reaction due to other cardiac and vascular devices, implants and grafts, initial encounter: Principal | ICD-10-CM

## 2013-09-30 LAB — CULTURE, BLOOD (ROUTINE X 2)
Culture: NO GROWTH
Culture: NO GROWTH

## 2013-09-30 LAB — BASIC METABOLIC PANEL
BUN: 14 mg/dL (ref 6–23)
CO2: 24 mEq/L (ref 19–32)
Calcium: 8.2 mg/dL — ABNORMAL LOW (ref 8.4–10.5)
Chloride: 97 mEq/L (ref 96–112)
Creatinine, Ser: 5.19 mg/dL — ABNORMAL HIGH (ref 0.50–1.35)
GFR calc Af Amer: 13 mL/min — ABNORMAL LOW (ref >90)
GFR calc non Af Amer: 11 mL/min — ABNORMAL LOW (ref >90)
Glucose, Bld: 91 mg/dL (ref 70–99)
Potassium: 3.8 mEq/L (ref 3.7–5.3)
Sodium: 137 mEq/L (ref 137–147)

## 2013-09-30 LAB — GLUCOSE, CAPILLARY
Glucose-Capillary: 83 mg/dL (ref 70–99)
Glucose-Capillary: 125 mg/dL — ABNORMAL HIGH (ref 70–99)
Glucose-Capillary: 117 mg/dL — ABNORMAL HIGH (ref 70–99)
Glucose-Capillary: 112 mg/dL — ABNORMAL HIGH (ref 70–99)

## 2013-09-30 LAB — PROTIME-INR
INR: 1.29 (ref 0.00–1.49)
Prothrombin Time: 15.8 seconds — ABNORMAL HIGH (ref 11.6–15.2)

## 2013-09-30 LAB — CBC
HCT: 22.7 % — ABNORMAL LOW (ref 39.0–52.0)
Hemoglobin: 7.9 g/dL — ABNORMAL LOW (ref 13.0–17.0)
MCH: 30.2 pg (ref 26.0–34.0)
MCHC: 34.8 g/dL (ref 30.0–36.0)
MCV: 86.6 fL (ref 78.0–100.0)
Platelets: 321 10*3/uL (ref 150–400)
RBC: 2.62 MIL/uL — ABNORMAL LOW (ref 4.22–5.81)
RDW: 17.9 % — ABNORMAL HIGH (ref 11.5–15.5)
WBC: 14.2 10*3/uL — ABNORMAL HIGH (ref 4.0–10.5)

## 2013-09-30 MED ORDER — DARBEPOETIN ALFA-POLYSORBATE 60 MCG/0.3ML IJ SOLN
60.0000 ug | INTRAMUSCULAR | Status: DC
  Administered 2013-10-03: 60 ug via INTRAVENOUS
  Filled 2013-09-30 (×2): qty 0.3

## 2013-09-30 NOTE — Progress Notes (Signed)
Rehab admissions - I spoke with case manager today.  Patient has still not been deemed chronic dialysis.  Once he becomes chronic dialysis, he likely will still be in hospital for a prolonged time.  I am not able to accommodate his needs on inpatient rehab at this time.  I will sign off for acute inpatient rehab.  Call me for questions.  RC:[phone]

## 2013-09-30 NOTE — Progress Notes (Signed)
Patient constantly pulls telemetry leads off and attempts to pull on his rt chest hemodialysis catheter,bilateral safety mittens applied.Will continue to monitor Odean Mcelwain Joselita,RN

## 2013-09-30 NOTE — Progress Notes (Signed)
Utilization review completed.  

## 2013-09-30 NOTE — Progress Notes (Signed)
Physical Therapy Treatment Patient Details Name: Bradley [name] MRN: [id number] DOB: 1955-08-16 Today's Date: 09/30/2013 Time: [id number] PT Time Calculation (min): 24 min  PT Assessment / Plan / Recommendation  History of Present Illness Pt admit with sepsis and r/o endocarditis.  Anoxic brain injury.   PT Comments   Pt continues with decreased balance and mobility, impaired cognition.  Will benefit from CIR for continued rehab.  Follow Up Recommendations  CIR     Does the patient have the potential to tolerate intense rehabilitation     Barriers to Discharge        Equipment Recommendations   (TBD)    Recommendations for Other Services Rehab consult  Frequency Min 3X/week   Progress towards PT Goals Progress towards PT goals: Progressing toward goals  Plan Current plan remains appropriate    Precautions / Restrictions Precautions Precautions: Fall Restrictions Weight Bearing Restrictions: No   Pertinent Vitals/Pain No c/o pain    Mobility  Bed Mobility Supine to sit: Min assist Sit to supine: Min assist General bed mobility comments: assist for UEs and trunk, increased time for sequencing and initiation Transfers Overall transfer level: Needs assistance Equipment used: Rolling walker (2 wheeled) Sit to Stand: Mod assist Stand pivot transfers: Mod assist General transfer comment: assist for wt shifts, increased time for all sequencing and initiation.  pt wtih noted R inattention, requires max tactile cues to turn to the R Ambulation/Gait Ambulation/Gait assistance: Mod assist Ambulation Distance (Feet): 50 Feet Assistive device: Rolling walker (2 wheeled) General Gait Details: pt with posterior lean, needs mod A for RW maneuvering and safety, max cuing to turn to R.  Pt wth difficulty tracking eyes to R, walking straight into wall with no awareness, pt fatigues quickly with ambuation, HR >100bpm throughout session    Exercises     PT Diagnosis:    PT Problem  List:   PT Treatment Interventions:     PT Goals (current goals can now be found in the care plan section)    Visit Information  Last PT Received On: 09/30/13 Assistance Needed: +1 History of Present Illness: Pt admit with sepsis and r/o endocarditis.  Anoxic brain injury.    Subjective Data      Cognition  Cognition Arousal/Alertness: Awake/alert Behavior During Therapy: Flat affect Overall Cognitive Status: Impaired/Different from baseline Area of Impairment: Attention;Memory;Safety/judgement;Problem solving Orientation Level: Time;Situation;Place Current Attention Level: Sustained Memory: Decreased short-term memory Following Commands: Follows one step commands with increased time Safety/Judgement: Decreased awareness of safety Awareness: Intellectual Problem Solving: Slow processing;Decreased initiation;Difficulty sequencing;Requires verbal cues;Requires tactile cues    Balance  General Comments General comments (skin integrity, edema, etc.): during seated rest breaks attempted cognitve orientation tasks.  pt perseverates on fact that he is here waiting for "money to be transferred" for "dope money"  End of Session PT - End of Session Equipment Utilized During Treatment: Gait belt Activity Tolerance: Patient limited by fatigue Patient left: in bed;with call bell/phone within reach;with bed alarm set Nurse Communication: Mobility status   GP     Bradley [name] 09/30/2013, 10:40 AM

## 2013-09-30 NOTE — Progress Notes (Signed)
[facility]                                                                              Patient Demographics  Bradley [name], is a 58 y.o. male, DOB - 1955/10/17, [id number]  Admit date - 09/08/2013   Admitting Physician Berle Mull, MD  Outpatient Primary MD for the patient is [name], MD  LOS - 22   Chief Complaint  Patient presents with  . Altered Mental Status      Brief narrative:  58 year old male with history of aortic stenosis, status post pediatric aortic valvulotomy at age 25 followed by tissue AVR (Bentall procedure) 02/2000 with cardiac cath 02/2010 with normal coronaries, chronic LBBB, thyroid cancer post thyroidectomy >> hypothyroidism, HTN, and depression, who had been generally unwell for 2-3 weeks. He was seen in the ED on 09/06/13 and assessed as influenza related vomiting, diarrhea, abdominal pain, body aches, fever and mild confusion and was ultimately discharged on Tamiflu. Patient worsened at home with progressive confusion/disorientation and was admitted with sepsis, septic brain emboli and rule out infective endocarditis. Requiring hemodialysis, unsure as to whether he will be clipped. CIR evaluation currently pending.   Assessment & Plan   Severe sepsis secondary to MSSA bacteremia w/ sepsis / infective endocarditis w/ cerebral septic emboli / UTI  -Blood cultures x 2 MSSA - follow up cultures negative, however on antibiotics -Chest x-ray suggested early pneumonia of left lung base with small left pleural effusion  -UA cx with MSSA UTI  -TTE (09/09/13) without vegetation  -Cardiology recommends 6 weeks of antibiotics for presumed endocarditis through October 21, 2013, no TEE at this time -has prior tissue AVR  -ID directing antibiotics, currently on naficillin and rifampin -Patient continues to have low grade fever, however, leukocytosis trending downward -Cdiff PCR negative on 2/16 and 09/16/13 -Blood cultures from 09/24/13, show no growth  to date  Toxic metabolic encephalopathy  -secondary to severe sepsis, azotemia, and septic brain emboli  -MRI Brain (09/09/13) with multiple bilateral supra tentorial and right cerebellar regions consistent with acute partially hemorrhagic infarcts suspected secondary to septic emboli  -Continue supportive care and treat underlying causes  -Neurology has evaluated  -QTc prolonged so haldol not safe  -[id number], folic acid [id number], and ammonia 17   Severe thrombocytopenia  -Resolved, Platelet counts dropped to a nadir of 23  -felt to be due to sepsis   Acute Renal Failure/Acute tubular necrosis  -due to bacteremia/immune complex or gentamicin  -baseline renal fnx: 18 / 0.93 -progressive failure since admit -Transferred to [name] 09/15/2013 for hemodialysis  -Hemodialysis started 09/17/2013  -Renal US normal kidneys, no hydronephrosis -Nephrology directing care   Abdominal distention/diarrhea  -distention improved -diarrhea likely due to antibiotics, which has resolved -C diff negative 2/16 and 2/17   Hypokalemia  -will continue K-dur 6mEq daily and continue to monitor   NSTEMI (Type II)  -peak Tropinin 2.26  -Cardiology has no plans for cath at this point  -has stable ST elevation r/t LBBB   Acute Systolic HF and Chronic grade 1 Diastolic CHF  -ECHO Oct 123456 with EF 60-65% with severe LVH and no RWMA  -ECHO Sep 09, 2013 EF 45-50% with septal and apical hypokinesis   History of thyroid cancer, post thyroidectomy hypothyroidism  -TSH elevated at 10.174  -Synthroid increased  -Will need monitoring and repeat labs in 4-6 weeks   Hypertension  -Holding lisinopril due to acute renal failure  -Continue metoprolol IV PRN  Hyperglycemia  -Hemoglobin A1c 5.7  -Continue ISS and CBG monitoring  History of depression  -hold Zoloft secondary to acute encephalopathy.   Anxiety/Insomnia -Will discontinue ambien -Continue Klonopin QHS PRN, and Ativan daily PRN -Continue to  monitor  Hyponatremia  -Resolved, due to volume overload  -corrected after initiation of hemodialysis  Deconditioning -CIR consulted, and pending evaluation  Code Status: Full  Family Communication: None at bedside  Disposition Plan: Admitted.  Pending CIR evaluation  Time Spent in minutes   20 minutes  Procedures/Course  2/09 - admit  2/10 - ID, Heme and Cards consults. TTE EF 45%. No clear vegetations  2/14 - PCCM consult  2/15 - Junctional rhythm with precedex, mental status improved. Transferred to [facility] for possible HD 2/16 - Renal U/S- normal kidneys, no hydronephrosis  2/18 - transferred to ICU for diaphoresis, ST changes and confusion 2/21 - patient pulled out L IJ HD catheter that was placed on 09/17/13 2/21 - L [place] HD catheter placed 2/26 - Conversion of R IJ temp cath to [facility] cath  [place]   Nephrology PCCM Infectious disease Interventional radiology Cardiology Neurology Hematology CIR  DVT Prophylaxis Heparin   Lab Results  Component Value Date   PLT 321 09/30/2013    Medications  Scheduled Meds: . antiseptic oral rinse  15 mL Mouth Rinse q12n4p  . aspirin EC  81 mg Oral Daily  . chlorhexidine  15 mL Mouth Rinse BID  . feeding supplement (RESOURCE BREEZE)  1 Container Oral BID BM  . ferric gluconate (FERRLECIT/NULECIT) IV  125 mg Intravenous Weekly  . heparin subcutaneous  5,000 Units Subcutaneous 3 times per day  . insulin aspart  0-9 Units Subcutaneous TID WC  . levothyroxine  200 mcg Oral QAC breakfast  . multivitamin  1 tablet Oral QHS  . nafcillin IV  2 g Intravenous Q4H  . pantoprazole  40 mg Oral Q1200  . potassium chloride  20 mEq Oral Daily  . rifampin  600 mg Oral Daily  . sevelamer carbonate  1.6 g Oral TID WC  . sodium chloride  3 mL Intravenous Q12H   Continuous Infusions:  PRN Meds:.acetaminophen, acetaminophen, clonazePAM, heparin, hydrOXYzine, liver oil-zinc oxide, LORazepam, metoprolol, morphine injection, ondansetron (ZOFRAN)  IV  Antibiotics    Anti-infectives   Start     Dose/Rate Route Frequency Ordered Stop   09/24/13 1400  rifampin (RIFADIN) capsule 600 mg     600 mg Oral Daily 09/24/13 1254     09/14/13 1400  rifampin (RIFADIN) 300 mg in sodium chloride 0.9 % 100 mL IVPB  Status:  Discontinued     300 mg 200 mL/hr over 30 Minutes Intravenous 3 times per day 09/14/13 1210 09/24/13 1254   09/11/13 1800  gentamicin (GARAMYCIN) 90 mg in dextrose 5 % 50 mL IVPB  Status:  Discontinued     90 mg 104.5 mL/hr over 30 Minutes Intravenous Every 12 hours 09/11/13 0932 09/13/13 0802   09/09/13 1800  nafcillin 2 g in dextrose 5 % 50 mL IVPB     2 g 100 mL/hr over 30 Minutes Intravenous Every 4 hours 09/09/13 1415     09/09/13 1430  nafcillin 2 g in dextrose  5 % 50 mL IVPB     2 g 100 mL/hr over 30 Minutes Intravenous  Once 09/09/13 1415 09/09/13 1603   09/09/13 1415  nafcillin injection 2 g  Status:  Discontinued     2 g Intravenous Every 4 hours 09/09/13 1410 09/09/13 1413   09/09/13 0800  vancomycin (VANCOCIN) IVPB 1000 mg/200 mL premix  Status:  Discontinued     1,000 mg 200 mL/hr over 60 Minutes Intravenous Every 8 hours 09/09/13 0341 09/11/13 0829   09/09/13 0500  gentamicin (GARAMYCIN) 90 mg in dextrose 5 % 50 mL IVPB  Status:  Discontinued     1 mg/kg  90.7 kg 104.5 mL/hr over 30 Minutes Intravenous 3 times per day 09/09/13 0340 09/11/13 0932   09/09/13 0400  ceFEPIme (MAXIPIME) 2 g in dextrose 5 % 50 mL IVPB  Status:  Discontinued     2 g 100 mL/hr over 30 Minutes Intravenous 3 times per day 09/09/13 0340 09/09/13 1410   09/08/13 2315  piperacillin-tazobactam (ZOSYN) IVPB 3.375 g     3.375 g 100 mL/hr over 30 Minutes Intravenous  Once 09/08/13 2307 09/09/13 0043   09/08/13 2315  vancomycin (VANCOCIN) IVPB 1000 mg/200 mL premix     1,000 mg 200 mL/hr over 60 Minutes Intravenous  Once 09/08/13 2307 09/09/13 0230        Subjective:   Donald Siva seen and examined today.  Patient is oriented to  self and place.  He currently has no complaints.  Currently eating breakfast.  Objective:   Filed Vitals:   09/29/13 1639 09/29/13 2116 09/30/13 0417 09/30/13 0900  BP: 115/81 132/80 127/78 132/77  Pulse: 104 98 92 89  Temp: 98.1 F (36.7 C) 99 F (37.2 C) 98.8 F (37.1 C) 98.9 F (37.2 C)  TempSrc:  Oral Oral Oral  Resp: 17 17 17 18   Height:      Weight:  95.5 kg (210 lb 8.6 oz)    SpO2: 94% 100% 100% 99%    Wt Readings from Last 3 Encounters:  09/29/13 95.5 kg (210 lb 8.6 oz)  09/06/13 90.719 kg (200 lb)  05/07/13 94.348 kg (208 lb)     Intake/Output Summary (Last 24 hours) at 09/30/13 1112 Last data filed at 09/30/13 0700  Gross per 24 hour  Intake    290 ml  Output   3370 ml  Net  -3080 ml    Exam  General: Well developed, well nourished, NAD, appears stated age  HEENT: NCAT, mucous membranes moist.   Neck: Supple, no JVD, no masses  Cardiovascular: S1 S2 auscultated, 2/6 SEM, Regular rate and rhythm.  Respiratory: Clear to auscultation bilaterally with equal chest rise  Abdomen: Soft, nontender, nondistended, + bowel sounds  Extremities: warm dry without cyanosis clubbing.  Neuro: Awake and alert, oriented to place, not time. Pleasant.  Follows commands.  Skin: Without rashes exudates or nodules  Data Review   Micro Results Recent Results (from the past 240 hour(s))  CULTURE, BLOOD (ROUTINE X 2)     Status: None   Collection Time    09/24/13 12:44 AM      Result Value Ref Range Status   Specimen Description BLOOD RIGHT ARM   Final   Special Requests BOTTLES DRAWN AEROBIC ONLY [facility]   Final   Culture  Setup Time     Final   Value: 09/24/2013 03:21     Performed at [facility]   Culture     Final  Value:        BLOOD CULTURE RECEIVED NO GROWTH TO DATE CULTURE WILL BE HELD FOR 5 DAYS BEFORE ISSUING A FINAL NEGATIVE REPORT     Performed at [facility]   Report Status PENDING   Incomplete  CULTURE, BLOOD (ROUTINE X 2)      Status: None   Collection Time    09/24/13 12:50 AM      Result Value Ref Range Status   Specimen Description BLOOD LEFT HAND   Final   Special Requests BOTTLES DRAWN AEROBIC ONLY 4CC   Final   Culture  Setup Time     Final   Value: 09/24/2013 03:21     Performed at [facility]   Culture     Final   Value:        BLOOD CULTURE RECEIVED NO GROWTH TO DATE CULTURE WILL BE HELD FOR 5 DAYS BEFORE ISSUING A FINAL NEGATIVE REPORT     Performed at [facility]   Report Status PENDING   Incomplete  CLOSTRIDIUM DIFFICILE BY PCR     Status: None   Collection Time    09/27/13  5:07 PM      Result Value Ref Range Status   C difficile by pcr NEGATIVE  NEGATIVE Final    Radiology Reports Dg Chest 2 View  09/06/2013   CLINICAL DATA:  Fever, nausea, vomiting.  EXAM: CHEST  2 VIEW  COMPARISON:  08/17/2011  FINDINGS: Prior median sternotomy and valve replacement. Mild cardiomegaly and vascular congestion. Low lung volumes with bibasilar atelectasis. No effusions. No acute bony abnormality.  IMPRESSION: Cardiomegaly, bibasilar atelectasis.   Electronically Signed   By: Rolm Baptise M.D.   On: 09/06/2013 19:34   Dg Abd 1 View  09/17/2013   CLINICAL DATA:  Abdominal distension  EXAM: ABDOMEN - 1 VIEW  COMPARISON:  CT ABD/PELVIS W CM dated 09/06/2013  FINDINGS: There is at least moderate gas distention of multiple loops of predominantly small bowel, though there may be minimal gaseous distention of the splenic flexure of the colon.  Examination is degraded secondary to the coned field-of-view on solitary provided radiograph.  Nondiagnostic evaluation for pneumoperitoneum secondary supine positioning and exclusion of the lower thorax. No definite pneumatosis or portal venous gas.  No definite abnormal intra-abdominal calcifications.  Degenerative change the lower lumbar spine.  IMPRESSION: Degraded examination with findings concerning for small bowel obstruction though there may be a minimal amount of  air within the splenic flexure of the colon. Clinical correlation is advised. Further evaluation with complete abdominal radiographic series may be performed as clinically indicated.   Electronically Signed   By: Sandi Mariscal M.D.   On: 09/17/2013 10:38   Ct Head Wo Contrast  09/12/2013   CLINICAL DATA:  Increased mental status change, evaluate for CVA versus worsening bleed.  EXAM: CT HEAD WITHOUT CONTRAST  TECHNIQUE: Contiguous axial images were obtained from the base of the skull through the vertex without intravenous contrast.  COMPARISON:  Prior MRI and and CT from 09/09/2013.  FINDINGS: Study is degraded by motion artifact.  Vasogenic edema as trending linear hyperdensity within the left parietal lobe is stable not significantly changed relative to the previous examination. Vague hypodensities involving the supratentorial brain and right cerebellar hemisphere are not significantly changed relative to recent MRI. No definite a new intracranial infarct identified. Previously seen hyperdense foci within the mid bilateral frontal lobes are less conspicuous as compared to prior. No new intracranial hemorrhage. No  mass lesion or midline shift. Ventricles are stable in size without evidence of hydrocephalus. No extra-axial fluid collection.  The calvarium is grossly intact. Orbits are normal. Paranasal sinuses and mastoid air cells remain clear.  IMPRESSION: 1. Limited study due to motion. No significant interval change in subacute left parietal infarct with small amount of associated hemorrhage. Additional multi focal infarcts involving the supratentorial brain and right cerebellar hemisphere are not significantly changed as well relative to recent MRI from 09/09/2013. No definite new intracranial infarct identified. No new intracranial hemorrhage.   Electronically Signed   By: Jeannine Boga M.D.   On: 09/12/2013 22:02   Ct Head Wo Contrast  09/09/2013   CLINICAL DATA:  Fever.  Altered mental status.   EXAM: CT HEAD WITHOUT CONTRAST  TECHNIQUE: Contiguous axial images were obtained from the base of the skull through the vertex without intravenous contrast.  COMPARISON:  CT HEAD W/O CM dated 09/06/2013; DG CHEST 1V PORT dated 09/08/2013; CT ABD/PELVIS W CM dated 09/06/2013  FINDINGS: Small focus of linear high attenuation in the left parietal lobe appear similar to the prior exam. The amount of edema at has increased compared to the prior exam suggesting a subacute process. This appears wedge-shaped in may represent an embolic infarct. There is a similar faint area of high attenuation in the left frontal lobe on image number 22 low with a more subtle area in the right frontal lobe that is more equivocal. The dural venous sinuses appear within normal limits. No mass lesion or midline shift. No hydrocephalus.  The calvarium is intact. Left-sided nasal septal spur. Mastoid air cells clear. Paranasal sinuses are normal.  IMPRESSION: 1. Increasing edema around linear high attenuation in the left parietal lobe. Followup MRI with and without contrast is recommended for further assessment. The patient presents with an infectious syndrome and septic emboli are in the differential considerations. Infarct with a small amount of hemorrhage or cortical laminar necrosis as well as developmental venous anomaly remain in the differential considerations. MRI should be useful in further characterization. 2. Dr. Leonel Ramsay called discuss the case at 0230 hr on the day of examination.   Electronically Signed   By: Dereck Ligas M.D.   On: 09/09/2013 02:32   Ct Head Wo Contrast  09/06/2013   CLINICAL DATA:  Confusion.  EXAM: CT HEAD WITHOUT CONTRAST  TECHNIQUE: Contiguous axial images were obtained from the base of the skull through the vertex without intravenous contrast.  COMPARISON:  None.  FINDINGS: There is a 1.3 cm curvilinear focus of hyperdensity in the subcortical left parietal lobe (image 25). There is no evidence of  surrounding edema. There is no other evidence of acute intracranial hemorrhage. There is no evidence of acute cortical infarct, mass, midline shift, or extra-axial fluid collection. Subcentimeter low density focus in the left caudate is suggestive of a remote lacunar infarct. Ventricles and sulci are normal. There is very mild mucosal thickening in the frontal sinuses and ethmoid air cells bilaterally. Mastoid air cells are clear. Orbits are unremarkable.  IMPRESSION: 1. Curvilinear hyperdensity in the subcortical left parietal lobe. Acute intracranial hemorrhage cannot be completely excluded, however there is no surrounding edema or evidence of hemorrhage elsewhere in the brain and the patient is not presenting in the setting of acute trauma. Given the curvilinear configuration, this may be vascular and represent an incidental developmental venous anomaly. 2. Remote left caudate lacunar infarct.   Electronically Signed   By: Logan Bores   On: 09/06/2013 22:29  [name] [id number] Contrast  09/09/2013   CLINICAL DATA:  Post aortic valve replacement 2011. Presenting with altered mental status, elevated white count and abnormal CT.  EXAM: MRI HEAD WITHOUT AND WITH CONTRAST  TECHNIQUE: Multiplanar, multiecho pulse sequences of the brain and surrounding structures were obtained without and with intravenous contrast.  CONTRAST:  37mL MULTIHANCE GADOBENATE DIMEGLUMINE 529 MG/ML IV SOLN  COMPARISON:  09/09/2013 and 09/06/2013 CT.  No comparison MR.  FINDINGS: Exam is motion degraded.  Multiple bilateral supra tentorial and right cerebellar regions of restricted motion some of which are partially hemorrhagic with largest area left parietal lobe. In the present clinical setting, these findings are consistent with acute partially hemorrhagic infarcts and possibly related to septic emboli.  At most there is very minimal enhancement of the left parietal partially hemorrhagic infarct.  No intracranial mass lesion seen separate  from the above described findings.  Major intracranial vascular structures are patent.  IMPRESSION: Exam is motion degraded.  Multiple bilateral supra tentorial and right cerebellar regions of restricted motion some of which are partially hemorrhagic with largest area left parietal lobe. In the present clinical setting, these findings are consistent with acute partially hemorrhagic infarcts and possibly related to septic emboli.  These results were called by telephone at the time of interpretation on 09/09/2013 at 11:54 AM to Dr. Algis Liming , who verbally acknowledged these results.   Electronically Signed   By: Chauncey Cruel M.D.   On: 09/09/2013 12:03   Ct Abdomen Pelvis W Contrast  09/06/2013   CLINICAL DATA:  58 year old male abdominal pain, vomiting, diarrhea and low grade fever.  EXAM: CT ABDOMEN AND PELVIS WITH CONTRAST  TECHNIQUE: Multidetector CT imaging of the abdomen and pelvis was performed using the standard protocol following bolus administration of intravenous contrast.  CONTRAST:  100 cc intravenous Omnipaque 300  COMPARISON:  None.  FINDINGS: Cardiomegaly and aortic valve replacement noted. Mild bibasilar atelectasis/scarring is identified.  The liver, spleen, pancreas, gallbladder adrenal glands and kidneys are unremarkable except for small bilateral renal cysts.  There is no evidence of free fluid, enlarged lymph nodes, biliary dilation or abdominal aortic aneurysm.  The bowel, bladder and appendix are unremarkable. There is no evidence of bowel obstruction, abscess or pneumoperitoneum.  A small right inguinal hernia containing fat is identified.  Mild prostate enlargement is present.  No acute or suspicious bony abnormalities are identified.  IMPRESSION: No evidence of acute abnormality.  Cardiomegaly, aortic valve replacement, prostate enlargement and small right inguinal hernias containing fat.   Electronically Signed   By: Hassan Rowan M.D.   On: 09/06/2013 22:32   Ir Veno/ext/uni  Right  09/14/2013   CLINICAL DATA:  Poor venous access, request for PICC  EXAM: DUAL LUMEN LEFT MIDLINE VENOUS CATHETER PLACEMENT WITH ULTRASOUND AND FLUOROSCOPIC GUIDANCE  ADDITIONAL VENOGRAPHY OF THE RIGHT UPPER EXTREMITY  FLUOROSCOPY TIME:  7 MIN AND 30 SECONDS.  PROCEDURE: The patient's family member was advised of the possible risks and complications and agreed to undergo the procedure. The patient was then brought to the angiographic suite for the procedure.  The right and left arm was prepped with chlorhexidine, draped in the usual sterile fashion using maximum barrier technique (cap and mask, sterile gown, sterile gloves, large sterile sheet, hand hygiene and cutaneous antisepsis) and infiltrated locally with 1% Lidocaine.  Ultrasound demonstrated patency of the right and left brachial vein, and this was documented with an image. Under real-time ultrasound guidance, the right brachial vein was accessed with  a 21 gauge micropuncture needle and image documentation was performed. A 0.018 wire was introduced in to the vein. Over this, a dilator introducer was placed, the wire was unable to be advanced proximally so a venogram was performed with 10 cc of Omnipaque, this revealed venous extravasation. The right brachial vein site was abandoned and manual pressure was held until the site was soft with no continued bleeding. The left brachial vein was accessed with a 21 gauge micropuncture needle and multiple guidewires used to advance to the SVC/RA junction. A 5 French dual lumen power-injectable PICC was advanced, however unable to cross proximally to the lower SVC/right atrial junction, the PICC was left midline for this reason. Fluoroscopy during the procedure and fluoro spot radiograph confirms appropriate catheter position. The catheter aspirated and flushed well and was then covered with a sterile dressing.  Complications: Extravasation of 10 cc of Omnipaque to right brachial vein. Unable to advance left  brachial vein PICC to the SVC/RA junction, left brachial PICC was placed midline.  IMPRESSION: Successful left arm midline VENOUS CATHETER placement with ultrasound and fluoroscopic guidance. The catheter is ready for use.  Read By:  Tsosie Billing PA-C   Electronically Signed   By: Aletta Edouard M.D.   On: 09/14/2013 11:51   US Renal  09/15/2013   CLINICAL DATA:  Acute renal failure.  EXAM: RENAL/URINARY TRACT ULTRASOUND COMPLETE  COMPARISON:  CT abdomen and pelvis 09/06/2013.  FINDINGS: Right Kidney:  Length: Approximately 12.7 cm. No hydronephrosis. Well-preserved cortex. No shadowing calculi. Normal parenchymal echotexture without focal abnormalities.  Left Kidney:  Length: Approximately 12.5 cm. No hydronephrosis. Well-preserved cortex. No shadowing calculi. Normal parenchymal echotexture without focal abnormalities.  Bladder:  Decompressed by Foley catheter.  IMPRESSION: Normal kidneys.  Specifically, no evidence of hydronephrosis.   Electronically Signed   By: Evangeline Dakin M.D.   On: 09/15/2013 10:55   Ir Fluoro Guide Cv Line Left  09/14/2013   CLINICAL DATA:  Poor venous access, request for PICC  EXAM: DUAL LUMEN LEFT MIDLINE VENOUS CATHETER PLACEMENT WITH ULTRASOUND AND FLUOROSCOPIC GUIDANCE  ADDITIONAL VENOGRAPHY OF THE RIGHT UPPER EXTREMITY  FLUOROSCOPY TIME:  7 MIN AND 30 SECONDS.  PROCEDURE: The patient's family member was advised of the possible risks and complications and agreed to undergo the procedure. The patient was then brought to the angiographic suite for the procedure.  The right and left arm was prepped with chlorhexidine, draped in the usual sterile fashion using maximum barrier technique (cap and mask, sterile gown, sterile gloves, large sterile sheet, hand hygiene and cutaneous antisepsis) and infiltrated locally with 1% Lidocaine.  Ultrasound demonstrated patency of the right and left brachial vein, and this was documented with an image. Under real-time ultrasound guidance, the  right brachial vein was accessed with a 21 gauge micropuncture needle and image documentation was performed. A 0.018 wire was introduced in to the vein. Over this, a dilator introducer was placed, the wire was unable to be advanced proximally so a venogram was performed with 10 cc of Omnipaque, this revealed venous extravasation. The right brachial vein site was abandoned and manual pressure was held until the site was soft with no continued bleeding. The left brachial vein was accessed with a 21 gauge micropuncture needle and multiple guidewires used to advance to the SVC/RA junction. A 5 French dual lumen power-injectable PICC was advanced, however unable to cross proximally to the lower SVC/right atrial junction, the PICC was left midline for this reason. Fluoroscopy during the procedure  and fluoro spot radiograph confirms appropriate catheter position. The catheter aspirated and flushed well and was then covered with a sterile dressing.  Complications: Extravasation of 10 cc of Omnipaque to right brachial vein. Unable to advance left brachial vein PICC to the SVC/RA junction, left brachial PICC was placed midline.  IMPRESSION: Successful left arm midline VENOUS CATHETER placement with ultrasound and fluoroscopic guidance. The catheter is ready for use.  Read By:  Tsosie Billing PA-C   Electronically Signed   By: Aletta Edouard M.D.   On: 09/14/2013 11:51   Ir Fluoro Guide Cv Line Right  09/21/2013   CLINICAL DATA:  Renal failure  EXAM: IR RIGHT FLOURO GUIDE CV LINE; IR ULTRASOUND GUIDANCE VASC ACCESS RIGHT  MEDICATIONS AND MEDICAL HISTORY: None  ANESTHESIA/SEDATION: None  CONTRAST:  None  FLUOROSCOPY TIME:  18 seconds.  PROCEDURE: The procedure, risks, benefits, and alternatives were explained to the patient. Questions regarding the procedure were encouraged and answered. The patient understands and consents to the procedure.  The right neck was prepped with Betadine in a sterile fashion, and a sterile drape  was applied covering the operative field. A sterile gown and sterile gloves were used for the procedure.  Under sonographic guidance, a micropuncture needle was inserted into the right internal jugular vein and removed over a 018 wire which was up sized to a 3 J. the 20 cm temporary dialysis catheter was advanced over the wire to the right atrium. It was flushed and sewn in place.  COMPLICATIONS: None  FINDINGS: Tip of the temporary dialysis catheter is at the right atrium  IMPRESSION: Successful placement of a temporary right internal jugular vein dialysis catheter.   Electronically Signed   By: Maryclare Bean M.D.   On: 09/21/2013 12:15   Ir US Guide Vasc Access Left  09/14/2013   CLINICAL DATA:  Poor venous access, request for PICC  EXAM: DUAL LUMEN LEFT MIDLINE VENOUS CATHETER PLACEMENT WITH ULTRASOUND AND FLUOROSCOPIC GUIDANCE  ADDITIONAL VENOGRAPHY OF THE RIGHT UPPER EXTREMITY  FLUOROSCOPY TIME:  7 MIN AND 30 SECONDS.  PROCEDURE: The patient's family member was advised of the possible risks and complications and agreed to undergo the procedure. The patient was then brought to the angiographic suite for the procedure.  The right and left arm was prepped with chlorhexidine, draped in the usual sterile fashion using maximum barrier technique (cap and mask, sterile gown, sterile gloves, large sterile sheet, hand hygiene and cutaneous antisepsis) and infiltrated locally with 1% Lidocaine.  Ultrasound demonstrated patency of the right and left brachial vein, and this was documented with an image. Under real-time ultrasound guidance, the right brachial vein was accessed with a 21 gauge micropuncture needle and image documentation was performed. A 0.018 wire was introduced in to the vein. Over this, a dilator introducer was placed, the wire was unable to be advanced proximally so a venogram was performed with 10 cc of Omnipaque, this revealed venous extravasation. The right brachial vein site was abandoned and manual  pressure was held until the site was soft with no continued bleeding. The left brachial vein was accessed with a 21 gauge micropuncture needle and multiple guidewires used to advance to the SVC/RA junction. A 5 French dual lumen power-injectable PICC was advanced, however unable to cross proximally to the lower SVC/right atrial junction, the PICC was left midline for this reason. Fluoroscopy during the procedure and fluoro spot radiograph confirms appropriate catheter position. The catheter aspirated and flushed well and was then covered with  a sterile dressing.  Complications: Extravasation of 10 cc of Omnipaque to right brachial vein. Unable to advance left brachial vein PICC to the SVC/RA junction, left brachial PICC was placed midline.  IMPRESSION: Successful left arm midline VENOUS CATHETER placement with ultrasound and fluoroscopic guidance. The catheter is ready for use.  Read By:  Tsosie Billing PA-C   Electronically Signed   By: Aletta Edouard M.D.   On: 09/14/2013 11:51   Ir US Guide Vasc Access Right  09/21/2013   CLINICAL DATA:  Renal failure  EXAM: IR RIGHT FLOURO GUIDE CV LINE; IR ULTRASOUND GUIDANCE VASC ACCESS RIGHT  MEDICATIONS AND MEDICAL HISTORY: None  ANESTHESIA/SEDATION: None  CONTRAST:  None  FLUOROSCOPY TIME:  18 seconds.  PROCEDURE: The procedure, risks, benefits, and alternatives were explained to the patient. Questions regarding the procedure were encouraged and answered. The patient understands and consents to the procedure.  The right neck was prepped with Betadine in a sterile fashion, and a sterile drape was applied covering the operative field. A sterile gown and sterile gloves were used for the procedure.  Under sonographic guidance, a micropuncture needle was inserted into the right internal jugular vein and removed over a 018 wire which was up sized to a 3 J. the 20 cm temporary dialysis catheter was advanced over the wire to the right atrium. It was flushed and sewn in place.   COMPLICATIONS: None  FINDINGS: Tip of the temporary dialysis catheter is at the right atrium  IMPRESSION: Successful placement of a temporary right internal jugular vein dialysis catheter.   Electronically Signed   By: Maryclare Bean M.D.   On: 09/21/2013 12:15   Dg Chest Port 1 View  09/18/2013   CLINICAL DATA:  Respiratory distress, shortness of breath.  EXAM: PORTABLE CHEST - 1 VIEW  COMPARISON:  09/17/2013  FINDINGS: Prior CABG and valve replacement. Left pass catheterization is in stable position. Cardiomegaly. Left lower lobe opacity, likely atelectasis. No effusions or overt edema.  IMPRESSION: Cardiomegaly.  Left lower lobe atelectasis.   Electronically Signed   By: Rolm Baptise M.D.   On: 09/18/2013 06:27   Dg Chest Port 1 View  09/17/2013   CLINICAL DATA:  Hemodialysis catheter placement  EXAM: PORTABLE CHEST - 1 VIEW  COMPARISON:  DG CHEST 1V PORT dated 09/17/2013; DG CHEST 1V PORT dated 09/13/2013; DG CHEST 1V PORT dated 09/08/2013  FINDINGS: Grossly unchanged enlarged cardiac silhouette and mediastinal contours post median sternotomy and valve replacement. Interval placement of left jugular approach dialysis catheter with tip projected over the superior aspect of the SVC. Lung volumes remain reduced. Pulmonary vasculature remains indistinct. Grossly unchanged perihilar and bibasilar heterogeneous / consolidative opacities, left greater than right. No definite pleural effusion or pneumothorax. Grossly unchanged bones including postsurgical/traumatic deformity of the distal end of the right clavicle.  IMPRESSION: 1. Interval placement of left jugular approach central venous catheter with tip projected over the superior SVC. No pneumothorax. 2. Similar findings of pulmonary edema and perihilar/bibasilar opacities, left greater than right, atelectasis versus infiltrate.   Electronically Signed   By: Sandi Mariscal M.D.   On: 09/17/2013 10:41   Dg Chest Port 1 View  09/17/2013   CLINICAL DATA:  Confirm PICC  line placement.  EXAM: PORTABLE CHEST - 1 VIEW  COMPARISON:  09/13/2013  FINDINGS: No PICC line is visualized within the chest or shoulder regions bilaterally.  There is cardiomegaly. Prior median sternotomy and valve replacement. Mild vascular congestion. Left lower lobe airspace opacity again  noted, unchanged. No visible effusions.  IMPRESSION: No visible PICC line.  Stable cardiomegaly and vascular congestion.  Stable left lower lobe atelectasis or consolidation/pneumonia.   Electronically Signed   By: Rolm Baptise M.D.   On: 09/17/2013 04:08   Dg Chest Port 1 View  09/13/2013   CLINICAL DATA:  Respiratory distress  EXAM: PORTABLE CHEST - 1 VIEW  COMPARISON:  DG CHEST 1V PORT dated 09/08/2013  FINDINGS: Evidence of CABG reidentified with moderate enlargement of the cardiac silhouette and central vascular congestion. Retrocardiac opacity persists. No overt alveolar edema or other filling processes otherwise identified. Trace if any left pleural fluid. No acute osseous finding.  IMPRESSION: Cardiomegaly with central vascular congestion and persistent retrocardiac atelectasis or possibly early pneumonia.   Electronically Signed   By: Conchita Paris M.D.   On: 09/13/2013 13:06   Dg Chest Port 1 View  09/08/2013   CLINICAL DATA:  Chest pain, hypertension  EXAM: PORTABLE CHEST - 1 VIEW  COMPARISON:  September 06, 2013  FINDINGS: The heart size and mediastinal contours are stable. Patient is status post prior CABG. The heart size is enlarged. Patchy consolidation of left lung base is identified. There is mild atelectasis of right lung base. There is no pulmonary edema. There is probable small left pleural effusion. The visualized skeletal structures are stable.  IMPRESSION: Early pneumonia of left lung base with small left pleural effusion.   Electronically Signed   By: Abelardo Diesel M.D.   On: 09/08/2013 23:41   Dg Abd Portable 2v  09/18/2013   CLINICAL DATA:  58 year old male with abdominal distension. Possible  small bowel obstruction. Initial encounter.  EXAM: PORTABLE ABDOMEN - 2 VIEW  COMPARISON:  09/17/2013 and earlier.  FINDINGS: Supine and portable cross-table lateral views of the abdomen. Decreased gaseous distension of small bowel. Numerous gas-filled mostly nondilated loops persist. Persistent right colon gas. No pneumoperitoneum identified. Mild motion artifact on both views. Stable visualized osseous structures.  IMPRESSION: Improved bowel gas pattern with decreased gaseous distension of small bowel and persistent colon gas. Pattern could reflect improving small bowel obstruction or ileus.  No free air identified.   Electronically Signed   By: Lars Pinks M.D.   On: 09/18/2013 17:50    CBC  Recent Labs Lab 09/26/13 0404 09/27/13 0505 09/28/13 0700 09/29/13 0410 09/30/13 0443  WBC 16.8* 16.1* 18.3* 16.4* 14.2*  HGB 8.1* 8.9* 8.5* 8.6* 7.9*  HCT 23.2* 25.6* 24.5* 24.9* 22.7*  PLT 272 274 310 317 321  MCV 83.2 83.7 83.9 84.7 86.6  MCH 29.0 29.1 29.1 29.3 30.2  MCHC 34.9 34.8 34.7 34.5 34.8  RDW 17.1* 17.4* 17.4* 17.4* 17.9*    Chemistries   Recent Labs Lab 09/24/13 0050 09/25/13 0452  09/27/13 0505 09/28/13 0700 09/29/13 0410 09/29/13 0814 09/30/13 0443  NA 135* 137  < > 140 139 133* 136* 137  K 3.8 4.0  < > 3.5* 4.0 3.5* 3.3* 3.8  CL 93* 97  < > 97 99 93* 95* 97  CO2 20 25  < > 28 24 23 24 24   GLUCOSE 105* 122*  < > 111* 103* 139* 121* 91  BUN 41* 20  < > 15 23 30* 32* 14  CREATININE 7.51* 5.19*  < > 4.71* 6.92* 8.51* 8.69* 5.19*  CALCIUM 7.7* 7.8*  < > 8.6 8.4 8.4 8.2* 8.2*  AST 23 22  --   --   --   --   --   --  ALT 19 16  --   --   --   --   --   --   ALKPHOS 54 50  --   --   --   --   --   --   BILITOT 0.9 0.6  --   --   --   --   --   --   < > = values in this interval not displayed. ------------------------------------------------------------------------------------------------------------------ estimated creatinine clearance is 18 ml/min (by C-G formula based  on Cr of 5.19). ------------------------------------------------------------------------------------------------------------------ No results found for this basename: HGBA1C,  in the last 72 hours ------------------------------------------------------------------------------------------------------------------ No results found for this basename: CHOL, HDL, LDLCALC, TRIG, CHOLHDL, LDLDIRECT,  in the last 72 hours ------------------------------------------------------------------------------------------------------------------ No results found for this basename: TSH, T4TOTAL, FREET3, T3FREE, THYROIDAB,  in the last 72 hours ------------------------------------------------------------------------------------------------------------------ No results found for this basename: VITAMINB12, FOLATE, FERRITIN, TIBC, IRON, RETICCTPCT,  in the last 72 hours  Coagulation profile  Recent Labs Lab 09/26/13 0404 09/27/13 0505 09/28/13 0700 09/29/13 0410 09/30/13 0443  INR 1.29 1.36 1.33 1.26 1.29    No results found for this basename: DDIMER,  in the last 72 hours  Cardiac Enzymes No results found for this basename: CK, CKMB, TROPONINI, MYOGLOBIN,  in the last 168 hours ------------------------------------------------------------------------------------------------------------------ No components found with this basename: POCBNP,     Danilo Cappiello D.O. on 09/30/2013 at 11:12 AM  Between 7am to 7pm - Pager - [phone]  After 7pm go to [email or web address] - password [email or web address]  And look for the night coverage person covering for me after hours  [facility] Office  [phone]

## 2013-09-30 NOTE — Progress Notes (Signed)
Occupational Therapy Treatment Patient Details Name: Bradley [name] MRN: [id number] DOB: Nov 18, 1955 Today's Date: 09/30/2013 Time: [id number] OT Time Calculation (min): 32 min  OT Assessment / Plan / Recommendation  History of present illness Pt admit with sepsis and r/o endocarditis.  Anoxic brain injury.   OT comments  Pt making progress and should continue with acute OT services requires increased time to process one step instructions and needs verbal and physical cues to initiate and complete tasks  Follow Up Recommendations  CIR;Supervision/Assistance - 24 hour    Barriers to Discharge       Equipment Recommendations  None recommended by OT;Other (comment) (TBD)    Recommendations for Other Services    Frequency Min 2X/week   Progress towards OT Goals Progress towards OT goals: Progressing toward goals  Plan Discharge plan remains appropriate    Precautions / Restrictions Precautions Precautions: Fall Restrictions Weight Bearing Restrictions: No   Pertinent Vitals/Pain No c/o pain    ADL  Grooming: Performed;Wash/dry hands;Wash/dry face;Minimal assistance;Other (comment) (mod verbal and physical cues) Upper Body Bathing: Simulated;Moderate assistance;Other (comment) (mod verbal and physical cues) Lower Body Dressing: Performed;Maximal assistance;Other (comment) (mod verbal and physical cues) Toilet Transfer: Performed;Moderate assistance Toilet Transfer Method: Sit to stand Toilet Transfer Equipment: Bedside commode ADL Comments: Sat EOB x 10 min with min guard A, able to follow one step directions 60% of time anr required verbal and physicanl cues to attend to tasks for < 1 minute    OT Diagnosis:    OT Problem List:   OT Treatment Interventions:     OT Goals(current goals can now be found in the care plan section)    Visit Information  Last OT Received On: 09/30/13 Assistance Needed: +1 History of Present Illness: Pt admit with sepsis and r/o endocarditis.   Anoxic brain injury.                 Cognition  Cognition Arousal/Alertness: Awake/alert Behavior During Therapy: Flat affect Overall Cognitive Status: Impaired/Different from baseline Area of Impairment: Attention;Memory;Safety/judgement;Problem solving Orientation Level: Time;Situation;Place Current Attention Level: Sustained Memory: Decreased short-term memory Following Commands: Follows one step commands with increased time Safety/Judgement: Decreased awareness of safety Awareness: Intellectual Problem Solving: Slow processing;Decreased initiation;Difficulty sequencing;Requires verbal cues;Requires tactile cues General Comments: Delayed processing time      Mobility  Bed Mobility Overal bed mobility: Needs Assistance Supine to sit: Min assist Sit to supine: Min assist General bed mobility comments: assist for UEs and trunk, increased time for sequencing and initiation Transfers Overall transfer level: Needs assistance Equipment used: Rolling walker (2 wheeled) Transfers: Sit to/from Stand Sit to Stand: Mod assist Stand pivot transfers: Mod assist          Balance Balance Overall balance assessment: Needs assistance Sitting-balance support: No upper extremity supported;Feet supported Sitting balance-Leahy Scale: Good Standing balance support: Bilateral upper extremity supported;Single extremity supported;During functional activity Standing balance-Leahy Scale: Poor  End of Session OT - End of Session Equipment Utilized During Treatment: Gait belt;Other (comment) (BSC) Activity Tolerance: Patient limited by fatigue Patient left: in bed;with call bell/phone within reach;with bed alarm set  GO     [name] 09/30/2013, 2:47 PM

## 2013-09-30 NOTE — Progress Notes (Signed)
[facility]  Date of Admission:  09/08/2013  Antibiotics: Total antibiotics 21 days  Subjective: No acute events  Objective: Temp:  [98.1 F (36.7 C)-99 F (37.2 C)] 98.8 F (37.1 C) (03/03 0417) Pulse Rate:  [92-124] 92 (03/03 0417) Resp:  [17-24] 17 (03/03 0417) BP: (115-152)/(73-99) 127/78 mmHg (03/03 0417) SpO2:  [93 %-100 %] 100 % (03/03 0417) Weight:  [210 lb 8.6 oz (95.5 kg)-212 lb 4.9 oz (96.3 kg)] 210 lb 8.6 oz (95.5 kg) (03/02 2116)  General: alert, nad, not confused Skin: no rashes Lungs: CTA B Cor: RRR Abdomen: soft, nt, nd Ext:no edema  Lab Results Lab Results  Component Value Date   WBC 14.2* 09/30/2013   HGB 7.9* 09/30/2013   HCT 22.7* 09/30/2013   MCV 86.6 09/30/2013   PLT 321 09/30/2013    Lab Results  Component Value Date   CREATININE 5.19* 09/30/2013   BUN 14 09/30/2013   NA 137 09/30/2013   K 3.8 09/30/2013   CL 97 09/30/2013   CO2 24 09/30/2013    Lab Results  Component Value Date   ALT 16 09/25/2013   AST 22 09/25/2013   ALKPHOS 50 09/25/2013   BILITOT 0.6 09/25/2013      Microbiology: Recent Results (from the past 240 hour(s))  CULTURE, BLOOD (ROUTINE X 2)     Status: None   Collection Time    09/24/13 12:44 AM      Result Value Ref Range Status   Specimen Description BLOOD RIGHT ARM   Final   Special Requests BOTTLES DRAWN AEROBIC ONLY 6CC   Final   Culture  Setup Time     Final   Value: 09/24/2013 03:21     Performed at [facility]   Culture     Final   Value:        BLOOD CULTURE RECEIVED NO GROWTH TO DATE CULTURE WILL BE HELD FOR 5 DAYS BEFORE ISSUING A FINAL NEGATIVE REPORT     Performed at [facility]   Report Status PENDING   Incomplete  CULTURE, BLOOD (ROUTINE X 2)     Status: None   Collection Time    09/24/13 12:50 AM      Result Value Ref Range Status   Specimen Description BLOOD LEFT HAND   Final   Special Requests BOTTLES DRAWN AEROBIC ONLY 4CC   Final   Culture  Setup Time     Final   Value: 09/24/2013 03:21     Performed at [facility]   Culture     Final   Value:        BLOOD CULTURE RECEIVED NO GROWTH TO DATE CULTURE WILL BE HELD FOR 5 DAYS BEFORE ISSUING A FINAL NEGATIVE REPORT     Performed at [facility]   Report Status PENDING   Incomplete  CLOSTRIDIUM DIFFICILE BY PCR     Status: None   Collection Time    09/27/13  5:07 PM      Result Value Ref Range Status   C difficile by pcr NEGATIVE  NEGATIVE Final    Studies/Results: No results found.  Assessment/Plan: 1) MSSA bacteremia and presumed endocarditis, septic emboli - No TEE planned.   Repeat blood cultures remained negative.  Continue with nafcillin and rifampin and will need for 6 weeks for presumed PVE through March 24th. No leukopenia or thrombocytopenia.  WBC a little improved today, down to 14,000.    Scharlene Gloss, MD  [facility] [email or web address] [phone] pager   [phone] cell 09/30/2013, 9:13 AM

## 2013-09-30 NOTE — Progress Notes (Signed)
Subjective:   Tired from therapy but feeling well  Objective Filed Vitals:   09/29/13 1530 09/29/13 1639 09/29/13 2116 09/30/13 0417  BP:  115/81 132/80 127/78  Pulse: 124 104 98 92  Temp:  98.1 F (36.7 C) 99 F (37.2 C) 98.8 F (37.1 C)  TempSrc:   Oral Oral  Resp:  17 17 17   Height:      Weight:   95.5 kg (210 lb 8.6 oz)   SpO2:  94% 100% 100%   Physical Exam General: alert and interactive. No acute distress. calm Heart: RRR 2/6 SEM Lungs: CTA unlabored Abdomen: soft nontender. +BS Extremities: trace ankle edema. bilat. Dialysis Access:  RIJ perm cath  Assessment:  1 Acute kidney injury- oliguric ATN from gent vs immune-complex disease from infection; 1st HD 2/18, cont MWF dialysis; has daily KCl 20 ordered - need to watch - K+ 3.8. 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 Tmax 98.6 WBC 11.5 -->18.3 today  3 AMS- improving, up with PT today 4 HTN/ vol excess- 127/78 cont to lower vol w HD. No BP meds  5 Hyperphosphatemia- Ca+ 8.2 Phos 7.8 renagel ac  6. Anemia- hgb 8.5- start Aranesp 60 today - Tsat 23- start low dose Iron next HD  7. Nutrition- Alb 1.4 renal diet Multivit. Nepro. Encourage protien  8 Debility- L hemiparesis    Shelle Iron, NP [facility] Beeper [phone] 09/30/2013,10:36 AM  LOS: 22 days   Renal Attending; Agree with a/p as articulated above.  Pt is more fluent in speech today. Marasia Newhall C   Additional Objective Labs: Basic Metabolic Panel:  Recent Labs Lab 09/29/13 0410 09/29/13 0814 09/30/13 0443  NA 133* 136* 137  K 3.5* 3.3* 3.8  CL 93* 95* 97  CO2 23 24 24   GLUCOSE 139* 121* 91  BUN 30* 32* 14  CREATININE 8.51* 8.69* 5.19*  CALCIUM 8.4 8.2* 8.2*  PHOS  --  7.8*  --    Liver Function Tests:  Recent Labs Lab 09/24/13 0050 09/25/13 0452 09/29/13 0814   AST 23 22  --   ALT 19 16  --   ALKPHOS 54 50  --   BILITOT 0.9 0.6  --   PROT 5.9* 5.7*  --   ALBUMIN 1.3* 1.4* 1.4*   No results found for this basename: LIPASE, AMYLASE,  in the last 168 hours CBC:  Recent Labs Lab 09/26/13 0404 09/27/13 0505 09/28/13 0700 09/29/13 0410 09/30/13 0443  WBC 16.8* 16.1* 18.3* 16.4* 14.2*  HGB 8.1* 8.9* 8.5* 8.6* 7.9*  HCT 23.2* 25.6* 24.5* 24.9* 22.7*  MCV 83.2 83.7 83.9 84.7 86.6  PLT 272 274 310 317 321   Blood Culture    Component Value Date/Time   SDES BLOOD LEFT HAND 09/24/2013 0050   SPECREQUEST BOTTLES DRAWN AEROBIC ONLY 4CC 09/24/2013 0050   CULT  Value:        BLOOD CULTURE RECEIVED NO GROWTH TO DATE CULTURE WILL BE HELD FOR 5 DAYS BEFORE ISSUING A FINAL NEGATIVE REPORT Performed at [facility] 09/24/2013 0050   REPTSTATUS PENDING 09/24/2013 0050    Cardiac Enzymes: No results found for this basename: CKTOTAL, CKMB, CKMBINDEX, TROPONINI,  in the last 168 hours CBG:  Recent Labs Lab 09/28/13 2111 09/29/13 1253 09/29/13 1638 09/29/13 2113 09/30/13 0741  GLUCAP 108* 93 175* 86 83   Iron Studies: No  results found for this basename: IRON, TIBC, TRANSFERRIN, FERRITIN,  in the last 72 hours @lablastinr3 @ Studies/Results: No results found. Medications:   . antiseptic oral rinse  15 mL Mouth Rinse q12n4p  . aspirin EC  81 mg Oral Daily  . chlorhexidine  15 mL Mouth Rinse BID  . feeding supplement (RESOURCE BREEZE)  1 Container Oral BID BM  . ferric gluconate (FERRLECIT/NULECIT) IV  125 mg Intravenous Weekly  . heparin subcutaneous  5,000 Units Subcutaneous 3 times per day  . insulin aspart  0-9 Units Subcutaneous TID WC  . levothyroxine  200 mcg Oral QAC breakfast  . multivitamin  1 tablet Oral QHS  . nafcillin IV  2 g Intravenous Q4H  . pantoprazole  40 mg Oral Q1200  . potassium chloride  20 mEq Oral Daily  . rifampin  600 mg Oral Daily  . sevelamer carbonate  1.6 g Oral TID WC  . sodium chloride  3 mL  Intravenous Q12H

## 2013-10-01 DIAGNOSIS — N186 End stage renal disease: Secondary | ICD-10-CM

## 2013-10-01 LAB — BASIC METABOLIC PANEL
BUN: 21 mg/dL (ref 6–23)
CO2: 25 mEq/L (ref 19–32)
Calcium: 8.2 mg/dL — ABNORMAL LOW (ref 8.4–10.5)
Chloride: 96 mEq/L (ref 96–112)
Creatinine, Ser: 7.21 mg/dL — ABNORMAL HIGH (ref 0.50–1.35)
GFR calc Af Amer: 9 mL/min — ABNORMAL LOW (ref >90)
GFR calc non Af Amer: 8 mL/min — ABNORMAL LOW (ref >90)
Glucose, Bld: 106 mg/dL — ABNORMAL HIGH (ref 70–99)
Potassium: 4 mEq/L (ref 3.7–5.3)
Sodium: 137 mEq/L (ref 137–147)

## 2013-10-01 LAB — PROTIME-INR
INR: 1.29 (ref 0.00–1.49)
Prothrombin Time: 15.8 seconds — ABNORMAL HIGH (ref 11.6–15.2)

## 2013-10-01 LAB — GLUCOSE, CAPILLARY
Glucose-Capillary: 93 mg/dL (ref 70–99)
Glucose-Capillary: 132 mg/dL — ABNORMAL HIGH (ref 70–99)
Glucose-Capillary: 146 mg/dL — ABNORMAL HIGH (ref 70–99)

## 2013-10-01 MED ORDER — HEPARIN SODIUM (PORCINE) 1000 UNIT/ML DIALYSIS
1000.0000 [IU] | INTRAMUSCULAR | Status: DC | PRN
Start: 2013-10-01 — End: 2013-10-01
  Filled 2013-10-01: qty 1

## 2013-10-01 MED ORDER — LIDOCAINE HCL (PF) 1 % IJ SOLN: 5.0000 mL | INTRAMUSCULAR | Status: DC | PRN

## 2013-10-01 MED ORDER — LIDOCAINE-PRILOCAINE 2.5-2.5 % EX CREA: 1.0000 "application " | TOPICAL_CREAM | CUTANEOUS | Status: DC | PRN

## 2013-10-01 MED ORDER — LORAZEPAM 0.5 MG PO TABS
ORAL_TABLET | ORAL | Status: AC
  Filled 2013-10-01: qty 1

## 2013-10-01 MED ORDER — SODIUM CHLORIDE 0.9 % IV SOLN: 100.0000 mL | INTRAVENOUS | Status: DC | PRN

## 2013-10-01 MED ORDER — ALTEPLASE 2 MG IJ SOLR
2.0000 mg | Freq: Once | INTRAMUSCULAR | Status: DC | PRN
  Filled 2013-10-01: qty 2

## 2013-10-01 MED ORDER — PENTAFLUOROPROP-TETRAFLUOROETH EX AERO: 1.0000 "application " | INHALATION_SPRAY | CUTANEOUS | Status: DC | PRN

## 2013-10-01 MED ORDER — NEPRO/CARBSTEADY PO LIQD
237.0000 mL | ORAL | Status: DC | PRN
Start: 2013-10-01 — End: 2013-10-01

## 2013-10-01 NOTE — Consult Note (Signed)
[facility] NOTE   MRN : [id number]  Reason for Consult: ESRD Referring Physician: Estanislado Emms, MD   History of Present Illness: 58 y/o male with Acute renal failure.  He had an Aortic valve repair surgery in his teens and then replacement in 2011.  He is being treated for endocarditis.  We have been ask to place to create AV Fistula verses graft.  He is right handed.  He is currently on dialysis using a right IJ catheter.  Past medical history includes hypertension and tyroid cancer.  His medications include Asprin and Metoprolol.  He denies hyperlipidemia and DM.     Current Facility-Administered Medications  Medication Dose Route Frequency Provider Last Rate Last Dose  . acetaminophen (TYLENOL) suppository 650 mg  650 mg Rectal Q6H PRN Berle Mull, MD   650 mg at 09/14/13 0140  . acetaminophen (TYLENOL) tablet 650 mg  650 mg Oral Q6H PRN Cherene Altes, MD   650 mg at 09/29/13 2236  . antiseptic oral rinse (BIOTENE) solution 15 mL  15 mL Mouth Rinse q12n4p Allie Bossier, MD   15 mL at 09/29/13 1603  . aspirin EC tablet 81 mg  81 mg Oral Daily Cherene Altes, MD   81 mg at 10/01/13 1320  . chlorhexidine (PERIDEX) 0.12 % solution 15 mL  15 mL Mouth Rinse BID Allie Bossier, MD   15 mL at 09/30/13 2049  . clonazePAM (KLONOPIN) tablet 1 mg  1 mg Oral QHS PRN Maryann Mikhail, DO   1 mg at 09/30/13 2221  . [START ON 10/03/2013] darbepoetin (ARANESP) injection 60 mcg  60 mcg Intravenous Q Fri-HD Marlena Clipper, NP      . feeding supplement (RESOURCE BREEZE) (RESOURCE BREEZE) liquid 1 Container  1 Container Oral BID BM Erlene Quan, RD   1 Container at 10/01/13 1400  . ferric gluconate (NULECIT) 125 mg in sodium chloride 0.9 % 100 mL IVPB  125 mg Intravenous Weekly Marlena Clipper, NP   125 mg at 09/29/13 [id number]  . heparin injection 2,000 Units  2,000 Units Dialysis PRN Sol Blazing, MD   2,000 Units at 09/24/13 [id number]  . heparin  injection 5,000 Units  5,000 Units Subcutaneous 3 times per day Hedy Jacob, PA-C   5,000 Units at 10/01/13 1319  . hydrOXYzine (ATARAX/VISTARIL) tablet 25 mg  25 mg Oral TID PRN Estanislado Emms, MD   25 mg at 09/25/13 2136  . insulin aspart (novoLOG) injection 0-9 Units  0-9 Units Subcutaneous TID WC Cherene Altes, MD   1 Units at 09/30/13 1306  . levothyroxine (SYNTHROID, LEVOTHROID) tablet 200 mcg  200 mcg Oral QAC breakfast Samella Parr, NP   200 mcg at 10/01/13 1320  . liver oil-zinc oxide (DESITIN) 40 % ointment   Topical PRN Gardiner Barefoot, NP      . LORazepam (ATIVAN) tablet 0.5 mg  0.5 mg Oral Daily PRN Cristal Ford, DO   0.5 mg at 10/01/13 0841  . metoprolol (LOPRESSOR) injection 2.5 mg  2.5 mg Intravenous Q4H PRN Berle Mull, MD   2.5 mg at 09/11/13 1706  . morphine 2 MG/ML injection 1 mg  1 mg Intravenous Q4H PRN Allie Bossier, MD   1 mg at 09/26/13 0125  . multivitamin (RENA-VIT) tablet 1 tablet  1 tablet Oral QHS Marlena Clipper, NP   1 tablet at 09/30/13 2049  . nafcillin 2 g in dextrose  5 % 50 mL IVPB  2 g Intravenous Q4H Carlyle Basques, MD   2 g at 10/01/13 1317  . ondansetron (ZOFRAN) injection 4 mg  4 mg Intravenous Q6H PRN Berle Mull, MD   4 mg at 09/20/13 2233  . pantoprazole (PROTONIX) EC tablet 40 mg  40 mg Oral Q1200 Cherene Altes, MD   40 mg at 10/01/13 1319  . potassium chloride SA (K-DUR,KLOR-CON) CR tablet 20 mEq  20 mEq Oral Daily Dorothy Spark, MD   20 mEq at 10/01/13 1319  . rifampin (RIFADIN) capsule 600 mg  600 mg Oral Daily Michel Bickers, MD   600 mg at 10/01/13 1319  . sevelamer carbonate (RENVELA) packet 1.6 g  1.6 g Oral TID WC Maryann Mikhail, DO   1.6 g at 10/01/13 1320  . sodium chloride 0.9 % injection 3 mL  3 mL Intravenous Q12H Berle Mull, MD   3 mL at 09/30/13 1951    Pt meds include: Statin :No Betablocker: Yes ASA: Yes Other anticoagulants/antiplatelets:   Past Medical History  Diagnosis Date  . Anxiety   .  Depression   . History of thyroid cancer   . Hypertension   . Hypothyroidism, postsurgical   . Aortic stenosis     s/p [place] with bioprosthetic AVR 02/2010; Last echo (9/11): Moderate LVH, EF 45-50%, AVR functioning appropriately, aortic valve mean gradient 21, diastolic dysfunction. Chest MRA (2/13): Mild to moderate dilatation at the sinus of Valsalva at 4.1 cm, mild dilatation ascending aorta distal to the tube graft at 3.9 cm, moderate dilatation of the innominate artery a 2.1 cm;    . Hx of cardiac cath     a. LHC in 02/2010: normal cors  . Hx of echocardiogram 2014    Echo (10/14): Severe LVH, EF 60-65%, normal wall motion, grade 1 diastolic dysfunction, AVR functioning normally, mild aortic stenosis (mean 19), moderately dilated aorta, mild LAE, mild RVE    Past Surgical History  Procedure Laterality Date  . Cardiac catheterization  03/02/2010    NORMAL CORONARY ARTERY  . Sternotomy      REDO  . Transthoracic echocardiogram  03/2010    SHOWED MILD REDUCTION OF LV FUNCTION  . Thyroidectomy    . Rotator cuff repair  2012    Right    Social History History  Substance Use Topics  . Smoking status: Never Smoker   . Smokeless tobacco: Never Used  . Alcohol Use: No    Family History Family History  Problem Relation Age of Onset  . Heart disease Father     No Known Allergies   REVIEW OF SYSTEMS  General: [ ]  Weight loss, [ ]  Fever, [ ]  chills Neurologic: [ ]  Dizziness, [ ]  Blackouts, [ ]  Seizure [ ]  Stroke, [ ]  "Mini stroke", [ ]  Slurred speech, [ ]  Temporary blindness; [ ]  weakness in arms or legs, [ ]  Hoarseness [ ]  Dysphagia Cardiac: [ ]  Chest pain/pressure, [ ]  Shortness of breath at rest [ ]  Shortness of breath with exertion, [ ]  Atrial fibrillation or irregular heartbeat  Vascular: [ ]  Pain in legs with walking, [ ]  Pain in legs at rest, [ ]  Pain in legs at night,  [ ]  Non-healing ulcer, [ ]  Blood clot in vein/DVT,   Pulmonary: [ ]  Home oxygen, [ ]  Productive  cough, [ ]  Coughing up blood, [ ]  Asthma,  [ ]  Wheezing [ ]  COPD Musculoskeletal:  [ ]  Arthritis, [ ]  Low back pain, [ ]   Joint pain Hematologic: [ ]  Easy Bruising, [ ]  Anemia; [ ]  Hepatitis Gastrointestinal: [ ]  Blood in stool, [ ]  Gastroesophageal Reflux/heartburn, Urinary: [ ]  chronic Kidney disease, [x ] on HD - [ ]  MWF or [ ]  TTHS, [ ]  Burning with urination, [ ]  Difficulty urinating Skin: [ ]  Rashes, [ ]  Wounds Psychological: [ ]  Anxiety, [ ]  Depression  Physical Examination Filed Vitals:   10/01/13 1130 10/01/13 1200 10/01/13 1210 10/01/13 1338  BP: 120/80 116/74 146/83 121/80  Pulse: 98 100 100 103  Temp:   98.8 F (37.1 C) 99.1 F (37.3 C)  TempSrc:   Oral Oral  Resp:  25 23   Height:      Weight:   205 lb 7.5 oz (93.2 kg)   SpO2: 98% 100% 98% 92%   Body mass index is 27.11 kg/(m^2).  General:  WDWN  HENT: WNL Eyes: Pupils equal Pulmonary: normal non-labored breathing , without Rales, rhonchi,  wheezing Cardiac: RRR, without  Murmurs, rubs or gallops; No carotid bruits Abdomen: soft, NT, no masses Skin: right post calf petechial  rashes, ulcers noted;  no Gangrene , no cellulitis; no open wounds;   Vascular Exam/Pulses:Palpable radial brachial pulses.  DP/PT pulses intact and equal   Musculoskeletal: no muscle wasting or atrophy; positive left arm edema without erythema edema  Neurologic: A&O X 3; Appropriate Affect ;  SENSATION: normal; MOTOR FUNCTION: 5/5 Symmetric Speech is fluent/normal   Significant Diagnostic Studies: CBC Lab Results  Component Value Date   WBC 14.2* 09/30/2013   HGB 7.9* 09/30/2013   HCT 22.7* 09/30/2013   MCV 86.6 09/30/2013   PLT 321 09/30/2013    BMET    Component Value Date/Time   NA 137 10/01/2013 0540   K 4.0 10/01/2013 0540   CL 96 10/01/2013 0540   CO2 25 10/01/2013 0540   GLUCOSE 106* 10/01/2013 0540   BUN 21 10/01/2013 0540   CREATININE 7.21* 10/01/2013 0540   CALCIUM 8.2* 10/01/2013 0540   GFRNONAA 8* 10/01/2013 0540   GFRAA 9*  10/01/2013 0540   Estimated Creatinine Clearance: 12.9 ml/min (by C-G formula based on Cr of 7.21).  COAG Lab Results  Component Value Date   INR 1.29 10/01/2013   INR 1.29 09/30/2013   INR 1.26 09/29/2013     Non-Invasive Vascular Imaging:  Pending vein mapping  ASSESSMENT/PLAN:  Endocarditis ESRD He is right handed pending vein mapping we will plan left AV fistula creation verses graft.  Laurence Slate [facility] 10/01/2013 4:01 PM  I have examined the patient, reviewed and agree with above. Discussed with the patient and his family present. Currently dialyzing without difficulty with his right IJ hemodialysis catheter. He is right-handed. Vein map is pending. He does have a very nice cephalic vein at the level of the wrist on the left. He does have an IV at the antecubital space on the left. Will follow along with you to determine optimal timing for access placement pending vein map. If the left arm is the best vein, would DC left arm I. the end of the right  Kristiane Morsch, MD 10/01/2013 4:51 PM

## 2013-10-01 NOTE — Procedures (Signed)
Tolerating HD without hemodynamic issues. Bradley [name] C

## 2013-10-01 NOTE — Progress Notes (Signed)
[facility] PROGRESS NOTE  Bradley [name] [id number] DOB: 1956-01-10 DOA: 09/08/2013 PCP: [name], MD  Assessment/Plan: Severe sepsis secondary to MSSA bacteremia w/ sepsis / infective endocarditis w/ cerebral septic emboli / UTI  -Blood cultures x 2 MSSA - follow up cultures negative, however on antibiotics  -UA cx with MSSA UTI  -TTE (09/09/13) without vegetation  -Cardiology recommends 6 weeks of antibiotics for presumed endocarditis through October 21, 2013, no TEE at this time  -has prior tissue AVR  -ID directing antibiotics, currently on naficillin and rifampin  -Patient continues to have low grade fever, however, leukocytosis trending downward  -Cdiff PCR negative on 2/16 and 09/16/13  -Blood cultures from 09/24/13, show no growth to date  Toxic metabolic encephalopathy  -gradually improving -secondary to severe sepsis, azotemia, and septic brain emboli  -MRI Brain (09/09/13) with multiple bilateral supra tentorial and right cerebellar regions consistent with acute partially hemorrhagic infarcts suspected secondary to septic emboli  -Continue supportive care and treat underlying causes  -Neurology has evaluated  -QTc prolonged so haldol not safe  -[id number], folic acid [id number], and ammonia 17  Severe thrombocytopenia -improved with tx of infectious process  -Resolved, Platelet counts dropped to a nadir of 23  -felt to be due to sepsis  Acute Renal Failure/Acute tubular necrosis  -due to bacteremia/immune complex or gentamicin  -baseline renal fnx: 18 / 0.93  -progressive failure since admit  -Transferred to [name] 09/15/2013 for hemodialysis  -Hemodialysis started 09/17/2013  -Renal US normal kidneys, no hydronephrosis  -appreciate renal followup Abdominal distention/diarrhea  -distention improved  -diarrhea likely due to antibiotics, which has resolved  -C diff negative 2/16 and 2/17  Hypokalemia  -will continue K-dur 28mEq daily and continue to monitor   NSTEMI (Type II)  -peak Tropinin 2.26  -Cardiology has no plans for cath at this point  -has stable ST elevation r/t LBBB  Acute Systolic HF and Chronic grade 1 Diastolic CHF  -ECHO Oct 123456 with EF 60-65% with severe LVH and no RWMA  -ECHO Sep 09, 2013 EF 45-50% with septal and apical hypokinesis  History of thyroid cancer, post thyroidectomy hypothyroidism  -TSH elevated at 10.174  -Synthroid increased  -Will need monitoring and repeat labs in 4-6 weeks  Hypertension  -Holding lisinopril due to acute renal failure  -Continue metoprolol IV PRN  Hyperglycemia  -Hemoglobin A1c 5.7  -Continue ISS and CBG monitoring  History of depression  -hold Zoloft secondary to acute encephalopathy.  Anxiety/Insomnia  -Will discontinue ambien  -Continue Klonopin QHS PRN, and Ativan daily PRN  -Continue to monitor  Hyponatremia  -Resolved, due to volume overload  -corrected after initiation of hemodialysis  Deconditioning  -CIR consulted, and pending evaluation  Code Status: Full  Family Communication: wife and son at bedside  Disposition Plan: SNF vs CIR   Procedures/Course  2/09 - admit  2/10 - ID, Heme and Cards consults. TTE EF 45%. No clear vegetations  2/14 - PCCM consult  2/15 - Junctional rhythm with precedex, mental status improved. Transferred to [facility] for possible HD  2/16 - Renal U/S- normal kidneys, no hydronephrosis  2/18 - transferred to ICU for diaphoresis, ST changes and confusion  2/21 - patient pulled out L IJ HD catheter that was placed on 09/17/13  2/21 - L [place] HD catheter placed  2/26 - Conversion of R IJ temp cath to [facility] cath  [place]  Nephrology  PCCM  Infectious disease  Interventional radiology  Cardiology  Neurology  Hematology  CIR  DVT Prophylaxis Heparin        Procedures/Studies: Dg Chest 2 View  09/06/2013   CLINICAL DATA:  Fever, nausea, vomiting.  EXAM: CHEST  2 VIEW  COMPARISON:  08/17/2011  FINDINGS: Prior median sternotomy and valve  replacement. Mild cardiomegaly and vascular congestion. Low lung volumes with bibasilar atelectasis. No effusions. No acute bony abnormality.  IMPRESSION: Cardiomegaly, bibasilar atelectasis.   Electronically Signed   By: Rolm Baptise M.D.   On: 09/06/2013 19:34   Dg Abd 1 View  09/17/2013   CLINICAL DATA:  Abdominal distension  EXAM: ABDOMEN - 1 VIEW  COMPARISON:  CT ABD/PELVIS W CM dated 09/06/2013  FINDINGS: There is at least moderate gas distention of multiple loops of predominantly small bowel, though there may be minimal gaseous distention of the splenic flexure of the colon.  Examination is degraded secondary to the coned field-of-view on solitary provided radiograph.  Nondiagnostic evaluation for pneumoperitoneum secondary supine positioning and exclusion of the lower thorax. No definite pneumatosis or portal venous gas.  No definite abnormal intra-abdominal calcifications.  Degenerative change the lower lumbar spine.  IMPRESSION: Degraded examination with findings concerning for small bowel obstruction though there may be a minimal amount of air within the splenic flexure of the colon. Clinical correlation is advised. Further evaluation with complete abdominal radiographic series may be performed as clinically indicated.   Electronically Signed   By: Sandi Mariscal M.D.   On: 09/17/2013 10:38   Ct Head Wo Contrast  09/12/2013   CLINICAL DATA:  Increased mental status change, evaluate for CVA versus worsening bleed.  EXAM: CT HEAD WITHOUT CONTRAST  TECHNIQUE: Contiguous axial images were obtained from the base of the skull through the vertex without intravenous contrast.  COMPARISON:  Prior MRI and and CT from 09/09/2013.  FINDINGS: Study is degraded by motion artifact.  Vasogenic edema as trending linear hyperdensity within the left parietal lobe is stable not significantly changed relative to the previous examination. Vague hypodensities involving the supratentorial brain and right cerebellar hemisphere  are not significantly changed relative to recent MRI. No definite a new intracranial infarct identified. Previously seen hyperdense foci within the mid bilateral frontal lobes are less conspicuous as compared to prior. No new intracranial hemorrhage. No mass lesion or midline shift. Ventricles are stable in size without evidence of hydrocephalus. No extra-axial fluid collection.  The calvarium is grossly intact. Orbits are normal. Paranasal sinuses and mastoid air cells remain clear.  IMPRESSION: 1. Limited study due to motion. No significant interval change in subacute left parietal infarct with small amount of associated hemorrhage. Additional multi focal infarcts involving the supratentorial brain and right cerebellar hemisphere are not significantly changed as well relative to recent MRI from 09/09/2013. No definite new intracranial infarct identified. No new intracranial hemorrhage.   Electronically Signed   By: Jeannine Boga M.D.   On: 09/12/2013 22:02   Ct Head Wo Contrast  09/09/2013   CLINICAL DATA:  Fever.  Altered mental status.  EXAM: CT HEAD WITHOUT CONTRAST  TECHNIQUE: Contiguous axial images were obtained from the base of the skull through the vertex without intravenous contrast.  COMPARISON:  CT HEAD W/O CM dated 09/06/2013; DG CHEST 1V PORT dated 09/08/2013; CT ABD/PELVIS W CM dated 09/06/2013  FINDINGS: Small focus of linear high attenuation in the left parietal lobe appear similar to the prior exam. The amount of edema at has increased compared to the prior exam suggesting a subacute process. This appears wedge-shaped in may represent  an embolic infarct. There is a similar faint area of high attenuation in the left frontal lobe on image number 22 low with a more subtle area in the right frontal lobe that is more equivocal. The dural venous sinuses appear within normal limits. No mass lesion or midline shift. No hydrocephalus.  The calvarium is intact. Left-sided nasal septal spur. Mastoid air  cells clear. Paranasal sinuses are normal.  IMPRESSION: 1. Increasing edema around linear high attenuation in the left parietal lobe. Followup MRI with and without contrast is recommended for further assessment. The patient presents with an infectious syndrome and septic emboli are in the differential considerations. Infarct with a small amount of hemorrhage or cortical laminar necrosis as well as developmental venous anomaly remain in the differential considerations. MRI should be useful in further characterization. 2. Dr. Leonel Ramsay called discuss the case at 0230 hr on the day of examination.   Electronically Signed   By: Dereck Ligas M.D.   On: 09/09/2013 02:32   Ct Head Wo Contrast  09/06/2013   CLINICAL DATA:  Confusion.  EXAM: CT HEAD WITHOUT CONTRAST  TECHNIQUE: Contiguous axial images were obtained from the base of the skull through the vertex without intravenous contrast.  COMPARISON:  None.  FINDINGS: There is a 1.3 cm curvilinear focus of hyperdensity in the subcortical left parietal lobe (image 25). There is no evidence of surrounding edema. There is no other evidence of acute intracranial hemorrhage. There is no evidence of acute cortical infarct, mass, midline shift, or extra-axial fluid collection. Subcentimeter low density focus in the left caudate is suggestive of a remote lacunar infarct. Ventricles and sulci are normal. There is very mild mucosal thickening in the frontal sinuses and ethmoid air cells bilaterally. Mastoid air cells are clear. Orbits are unremarkable.  IMPRESSION: 1. Curvilinear hyperdensity in the subcortical left parietal lobe. Acute intracranial hemorrhage cannot be completely excluded, however there is no surrounding edema or evidence of hemorrhage elsewhere in the brain and the patient is not presenting in the setting of acute trauma. Given the curvilinear configuration, this may be vascular and represent an incidental developmental venous anomaly. 2. Remote left  caudate lacunar infarct.   Electronically Signed   By: Logan Bores   On: 09/06/2013 22:29   Mr Brain W Wo Contrast  09/09/2013   CLINICAL DATA:  Post aortic valve replacement 2011. Presenting with altered mental status, elevated white count and abnormal CT.  EXAM: MRI HEAD WITHOUT AND WITH CONTRAST  TECHNIQUE: Multiplanar, multiecho pulse sequences of the brain and surrounding structures were obtained without and with intravenous contrast.  CONTRAST:  35mL MULTIHANCE GADOBENATE DIMEGLUMINE 529 MG/ML IV SOLN  COMPARISON:  09/09/2013 and 09/06/2013 CT.  No comparison MR.  FINDINGS: Exam is motion degraded.  Multiple bilateral supra tentorial and right cerebellar regions of restricted motion some of which are partially hemorrhagic with largest area left parietal lobe. In the present clinical setting, these findings are consistent with acute partially hemorrhagic infarcts and possibly related to septic emboli.  At most there is very minimal enhancement of the left parietal partially hemorrhagic infarct.  No intracranial mass lesion seen separate from the above described findings.  Major intracranial vascular structures are patent.  IMPRESSION: Exam is motion degraded.  Multiple bilateral supra tentorial and right cerebellar regions of restricted motion some of which are partially hemorrhagic with largest area left parietal lobe. In the present clinical setting, these findings are consistent with acute partially hemorrhagic infarcts and possibly related to septic emboli.  These results were called by telephone at the time of interpretation on 09/09/2013 at 11:54 AM to Dr. Algis Liming , who verbally acknowledged these results.   Electronically Signed   By: Chauncey Cruel M.D.   On: 09/09/2013 12:03   Ct Abdomen Pelvis W Contrast  09/06/2013   CLINICAL DATA:  58 year old male abdominal pain, vomiting, diarrhea and low grade fever.  EXAM: CT ABDOMEN AND PELVIS WITH CONTRAST  TECHNIQUE: Multidetector CT imaging of the  abdomen and pelvis was performed using the standard protocol following bolus administration of intravenous contrast.  CONTRAST:  100 cc intravenous Omnipaque 300  COMPARISON:  None.  FINDINGS: Cardiomegaly and aortic valve replacement noted. Mild bibasilar atelectasis/scarring is identified.  The liver, spleen, pancreas, gallbladder adrenal glands and kidneys are unremarkable except for small bilateral renal cysts.  There is no evidence of free fluid, enlarged lymph nodes, biliary dilation or abdominal aortic aneurysm.  The bowel, bladder and appendix are unremarkable. There is no evidence of bowel obstruction, abscess or pneumoperitoneum.  A small right inguinal hernia containing fat is identified.  Mild prostate enlargement is present.  No acute or suspicious bony abnormalities are identified.  IMPRESSION: No evidence of acute abnormality.  Cardiomegaly, aortic valve replacement, prostate enlargement and small right inguinal hernias containing fat.   Electronically Signed   By: Hassan Rowan M.D.   On: 09/06/2013 22:32   Ir Veno/ext/uni Right  09/14/2013   CLINICAL DATA:  Poor venous access, request for PICC  EXAM: DUAL LUMEN LEFT MIDLINE VENOUS CATHETER PLACEMENT WITH ULTRASOUND AND FLUOROSCOPIC GUIDANCE  ADDITIONAL VENOGRAPHY OF THE RIGHT UPPER EXTREMITY  FLUOROSCOPY TIME:  7 MIN AND 30 SECONDS.  PROCEDURE: The patient's family member was advised of the possible risks and complications and agreed to undergo the procedure. The patient was then brought to the angiographic suite for the procedure.  The right and left arm was prepped with chlorhexidine, draped in the usual sterile fashion using maximum barrier technique (cap and mask, sterile gown, sterile gloves, large sterile sheet, hand hygiene and cutaneous antisepsis) and infiltrated locally with 1% Lidocaine.  Ultrasound demonstrated patency of the right and left brachial vein, and this was documented with an image. Under real-time ultrasound guidance, the right  brachial vein was accessed with a 21 gauge micropuncture needle and image documentation was performed. A 0.018 wire was introduced in to the vein. Over this, a dilator introducer was placed, the wire was unable to be advanced proximally so a venogram was performed with 10 cc of Omnipaque, this revealed venous extravasation. The right brachial vein site was abandoned and manual pressure was held until the site was soft with no continued bleeding. The left brachial vein was accessed with a 21 gauge micropuncture needle and multiple guidewires used to advance to the SVC/RA junction. A 5 French dual lumen power-injectable PICC was advanced, however unable to cross proximally to the lower SVC/right atrial junction, the PICC was left midline for this reason. Fluoroscopy during the procedure and fluoro spot radiograph confirms appropriate catheter position. The catheter aspirated and flushed well and was then covered with a sterile dressing.  Complications: Extravasation of 10 cc of Omnipaque to right brachial vein. Unable to advance left brachial vein PICC to the SVC/RA junction, left brachial PICC was placed midline.  IMPRESSION: Successful left arm midline VENOUS CATHETER placement with ultrasound and fluoroscopic guidance. The catheter is ready for use.  Read By:  Tsosie Billing PA-C   Electronically Signed   By: Jenness Corner.D.  On: 09/14/2013 11:51   US Renal  09/15/2013   CLINICAL DATA:  Acute renal failure.  EXAM: RENAL/URINARY TRACT ULTRASOUND COMPLETE  COMPARISON:  CT abdomen and pelvis 09/06/2013.  FINDINGS: Right Kidney:  Length: Approximately 12.7 cm. No hydronephrosis. Well-preserved cortex. No shadowing calculi. Normal parenchymal echotexture without focal abnormalities.  Left Kidney:  Length: Approximately 12.5 cm. No hydronephrosis. Well-preserved cortex. No shadowing calculi. Normal parenchymal echotexture without focal abnormalities.  Bladder:  Decompressed by Foley catheter.  IMPRESSION: Normal  kidneys.  Specifically, no evidence of hydronephrosis.   Electronically Signed   By: Evangeline Dakin M.D.   On: 09/15/2013 10:55   Ir Fluoro Guide Cv Line Left  09/14/2013   CLINICAL DATA:  Poor venous access, request for PICC  EXAM: DUAL LUMEN LEFT MIDLINE VENOUS CATHETER PLACEMENT WITH ULTRASOUND AND FLUOROSCOPIC GUIDANCE  ADDITIONAL VENOGRAPHY OF THE RIGHT UPPER EXTREMITY  FLUOROSCOPY TIME:  7 MIN AND 30 SECONDS.  PROCEDURE: The patient's family member was advised of the possible risks and complications and agreed to undergo the procedure. The patient was then brought to the angiographic suite for the procedure.  The right and left arm was prepped with chlorhexidine, draped in the usual sterile fashion using maximum barrier technique (cap and mask, sterile gown, sterile gloves, large sterile sheet, hand hygiene and cutaneous antisepsis) and infiltrated locally with 1% Lidocaine.  Ultrasound demonstrated patency of the right and left brachial vein, and this was documented with an image. Under real-time ultrasound guidance, the right brachial vein was accessed with a 21 gauge micropuncture needle and image documentation was performed. A 0.018 wire was introduced in to the vein. Over this, a dilator introducer was placed, the wire was unable to be advanced proximally so a venogram was performed with 10 cc of Omnipaque, this revealed venous extravasation. The right brachial vein site was abandoned and manual pressure was held until the site was soft with no continued bleeding. The left brachial vein was accessed with a 21 gauge micropuncture needle and multiple guidewires used to advance to the SVC/RA junction. A 5 French dual lumen power-injectable PICC was advanced, however unable to cross proximally to the lower SVC/right atrial junction, the PICC was left midline for this reason. Fluoroscopy during the procedure and fluoro spot radiograph confirms appropriate catheter position. The catheter aspirated and  flushed well and was then covered with a sterile dressing.  Complications: Extravasation of 10 cc of Omnipaque to right brachial vein. Unable to advance left brachial vein PICC to the SVC/RA junction, left brachial PICC was placed midline.  IMPRESSION: Successful left arm midline VENOUS CATHETER placement with ultrasound and fluoroscopic guidance. The catheter is ready for use.  Read By:  Tsosie Billing PA-C   Electronically Signed   By: Aletta Edouard M.D.   On: 09/14/2013 11:51   Ir Fluoro Guide Cv Line Right  09/25/2013   INDICATION: Acute renal failure. Temporary catheter needs conversion to a tunneled dialysis catheter.  EXAM: CONVERSION OF NON TUNNELED CATHETER TO A TUNNELED DIALYSIS CATHETER WITH FLUOROSCOPIC GUIDANCE  Physician: Stephan Minister. Anselm Pancoast, MD  MEDICATIONS: 3 mg versed, 150 mcg fentanyl. The patient was already on scheduled antibiotics. A radiology nurse monitored the patient for moderate sedation.  ANESTHESIA/SEDATION: Moderate sedation time: 25 minutes  FLUOROSCOPY TIME:  24 seconds  PROCEDURE: Informed consent was obtained for conversion to a tunneled dialysis catheter. The existing right jugular catheter and right side of the neck was prepped and draped in sterile fashion. Maximal barrier sterile technique was utilized  including caps, mask, sterile gowns, sterile gloves, sterile drape, hand hygiene and skin antiseptic. The skin below the right clavicle was anesthetized with 1% lidocaine. A subcutaneous tract was created and a 23 cm tip to cuff HemoSplit catheter was placed through the tract to the temporary dialysis catheter site. The temporary dialysis catheter was exchanged for a peel-away sheath over a wire. The new dialysis catheter was placed through the peel-away sheath and the tip was placed in the right atrium. Both lumens aspirated and flushed well. Appropriate amount of heparin was placed in both lumens. Gelfoam was placed within the subcutaneous tract. The vein skin site and catheter exit  site were sutured with 2-0 Ethilon sutures.  COMPLICATIONS: None  FINDINGS: Catheter tip in the right atrium.  IMPRESSION: Successful conversion of a temporary dialysis catheter to a tunneled dialysis catheter with fluoroscopy.   Electronically Signed   By: Markus Daft M.D.   On: 09/25/2013 16:25   Ir Fluoro Guide Cv Line Right  09/21/2013   CLINICAL DATA:  Renal failure  EXAM: IR RIGHT FLOURO GUIDE CV LINE; IR ULTRASOUND GUIDANCE VASC ACCESS RIGHT  MEDICATIONS AND MEDICAL HISTORY: None  ANESTHESIA/SEDATION: None  CONTRAST:  None  FLUOROSCOPY TIME:  18 seconds.  PROCEDURE: The procedure, risks, benefits, and alternatives were explained to the patient. Questions regarding the procedure were encouraged and answered. The patient understands and consents to the procedure.  The right neck was prepped with Betadine in a sterile fashion, and a sterile drape was applied covering the operative field. A sterile gown and sterile gloves were used for the procedure.  Under sonographic guidance, a micropuncture needle was inserted into the right internal jugular vein and removed over a 018 wire which was up sized to a 3 J. the 20 cm temporary dialysis catheter was advanced over the wire to the right atrium. It was flushed and sewn in place.  COMPLICATIONS: None  FINDINGS: Tip of the temporary dialysis catheter is at the right atrium  IMPRESSION: Successful placement of a temporary right internal jugular vein dialysis catheter.   Electronically Signed   By: Maryclare Bean M.D.   On: 09/21/2013 12:15   Ir US Guide Vasc Access Left  09/14/2013   CLINICAL DATA:  Poor venous access, request for PICC  EXAM: DUAL LUMEN LEFT MIDLINE VENOUS CATHETER PLACEMENT WITH ULTRASOUND AND FLUOROSCOPIC GUIDANCE  ADDITIONAL VENOGRAPHY OF THE RIGHT UPPER EXTREMITY  FLUOROSCOPY TIME:  7 MIN AND 30 SECONDS.  PROCEDURE: The patient's family member was advised of the possible risks and complications and agreed to undergo the procedure. The patient was  then brought to the angiographic suite for the procedure.  The right and left arm was prepped with chlorhexidine, draped in the usual sterile fashion using maximum barrier technique (cap and mask, sterile gown, sterile gloves, large sterile sheet, hand hygiene and cutaneous antisepsis) and infiltrated locally with 1% Lidocaine.  Ultrasound demonstrated patency of the right and left brachial vein, and this was documented with an image. Under real-time ultrasound guidance, the right brachial vein was accessed with a 21 gauge micropuncture needle and image documentation was performed. A 0.018 wire was introduced in to the vein. Over this, a dilator introducer was placed, the wire was unable to be advanced proximally so a venogram was performed with 10 cc of Omnipaque, this revealed venous extravasation. The right brachial vein site was abandoned and manual pressure was held until the site was soft with no continued bleeding. The left brachial vein was accessed  with a 21 gauge micropuncture needle and multiple guidewires used to advance to the SVC/RA junction. A 5 French dual lumen power-injectable PICC was advanced, however unable to cross proximally to the lower SVC/right atrial junction, the PICC was left midline for this reason. Fluoroscopy during the procedure and fluoro spot radiograph confirms appropriate catheter position. The catheter aspirated and flushed well and was then covered with a sterile dressing.  Complications: Extravasation of 10 cc of Omnipaque to right brachial vein. Unable to advance left brachial vein PICC to the SVC/RA junction, left brachial PICC was placed midline.  IMPRESSION: Successful left arm midline VENOUS CATHETER placement with ultrasound and fluoroscopic guidance. The catheter is ready for use.  Read By:  Tsosie Billing PA-C   Electronically Signed   By: Aletta Edouard M.D.   On: 09/14/2013 11:51   Ir US Guide Vasc Access Right  09/21/2013   CLINICAL DATA:  Renal failure  EXAM: IR  RIGHT FLOURO GUIDE CV LINE; IR ULTRASOUND GUIDANCE VASC ACCESS RIGHT  MEDICATIONS AND MEDICAL HISTORY: None  ANESTHESIA/SEDATION: None  CONTRAST:  None  FLUOROSCOPY TIME:  18 seconds.  PROCEDURE: The procedure, risks, benefits, and alternatives were explained to the patient. Questions regarding the procedure were encouraged and answered. The patient understands and consents to the procedure.  The right neck was prepped with Betadine in a sterile fashion, and a sterile drape was applied covering the operative field. A sterile gown and sterile gloves were used for the procedure.  Under sonographic guidance, a micropuncture needle was inserted into the right internal jugular vein and removed over a 018 wire which was up sized to a 3 J. the 20 cm temporary dialysis catheter was advanced over the wire to the right atrium. It was flushed and sewn in place.  COMPLICATIONS: None  FINDINGS: Tip of the temporary dialysis catheter is at the right atrium  IMPRESSION: Successful placement of a temporary right internal jugular vein dialysis catheter.   Electronically Signed   By: Maryclare Bean M.D.   On: 09/21/2013 12:15   Dg Chest Port 1 View  09/18/2013   CLINICAL DATA:  Respiratory distress, shortness of breath.  EXAM: PORTABLE CHEST - 1 VIEW  COMPARISON:  09/17/2013  FINDINGS: Prior CABG and valve replacement. Left pass catheterization is in stable position. Cardiomegaly. Left lower lobe opacity, likely atelectasis. No effusions or overt edema.  IMPRESSION: Cardiomegaly.  Left lower lobe atelectasis.   Electronically Signed   By: Rolm Baptise M.D.   On: 09/18/2013 06:27   Dg Chest Port 1 View  09/17/2013   CLINICAL DATA:  Hemodialysis catheter placement  EXAM: PORTABLE CHEST - 1 VIEW  COMPARISON:  DG CHEST 1V PORT dated 09/17/2013; DG CHEST 1V PORT dated 09/13/2013; DG CHEST 1V PORT dated 09/08/2013  FINDINGS: Grossly unchanged enlarged cardiac silhouette and mediastinal contours post median sternotomy and valve replacement.  Interval placement of left jugular approach dialysis catheter with tip projected over the superior aspect of the SVC. Lung volumes remain reduced. Pulmonary vasculature remains indistinct. Grossly unchanged perihilar and bibasilar heterogeneous / consolidative opacities, left greater than right. No definite pleural effusion or pneumothorax. Grossly unchanged bones including postsurgical/traumatic deformity of the distal end of the right clavicle.  IMPRESSION: 1. Interval placement of left jugular approach central venous catheter with tip projected over the superior SVC. No pneumothorax. 2. Similar findings of pulmonary edema and perihilar/bibasilar opacities, left greater than right, atelectasis versus infiltrate.   Electronically Signed   By: Eldridge Abrahams.D.  On: 09/17/2013 10:41   Dg Chest Port 1 View  09/17/2013   CLINICAL DATA:  Confirm PICC line placement.  EXAM: PORTABLE CHEST - 1 VIEW  COMPARISON:  09/13/2013  FINDINGS: No PICC line is visualized within the chest or shoulder regions bilaterally.  There is cardiomegaly. Prior median sternotomy and valve replacement. Mild vascular congestion. Left lower lobe airspace opacity again noted, unchanged. No visible effusions.  IMPRESSION: No visible PICC line.  Stable cardiomegaly and vascular congestion.  Stable left lower lobe atelectasis or consolidation/pneumonia.   Electronically Signed   By: Rolm Baptise M.D.   On: 09/17/2013 04:08   Dg Chest Port 1 View  09/13/2013   CLINICAL DATA:  Respiratory distress  EXAM: PORTABLE CHEST - 1 VIEW  COMPARISON:  DG CHEST 1V PORT dated 09/08/2013  FINDINGS: Evidence of CABG reidentified with moderate enlargement of the cardiac silhouette and central vascular congestion. Retrocardiac opacity persists. No overt alveolar edema or other filling processes otherwise identified. Trace if any left pleural fluid. No acute osseous finding.  IMPRESSION: Cardiomegaly with central vascular congestion and persistent retrocardiac  atelectasis or possibly early pneumonia.   Electronically Signed   By: Conchita Paris M.D.   On: 09/13/2013 13:06   Dg Chest Port 1 View  09/08/2013   CLINICAL DATA:  Chest pain, hypertension  EXAM: PORTABLE CHEST - 1 VIEW  COMPARISON:  September 06, 2013  FINDINGS: The heart size and mediastinal contours are stable. Patient is status post prior CABG. The heart size is enlarged. Patchy consolidation of left lung base is identified. There is mild atelectasis of right lung base. There is no pulmonary edema. There is probable small left pleural effusion. The visualized skeletal structures are stable.  IMPRESSION: Early pneumonia of left lung base with small left pleural effusion.   Electronically Signed   By: Abelardo Diesel M.D.   On: 09/08/2013 23:41   Dg Abd Portable 2v  09/18/2013   CLINICAL DATA:  58 year old male with abdominal distension. Possible small bowel obstruction. Initial encounter.  EXAM: PORTABLE ABDOMEN - 2 VIEW  COMPARISON:  09/17/2013 and earlier.  FINDINGS: Supine and portable cross-table lateral views of the abdomen. Decreased gaseous distension of small bowel. Numerous gas-filled mostly nondilated loops persist. Persistent right colon gas. No pneumoperitoneum identified. Mild motion artifact on both views. Stable visualized osseous structures.  IMPRESSION: Improved bowel gas pattern with decreased gaseous distension of small bowel and persistent colon gas. Pattern could reflect improving small bowel obstruction or ileus.  No free air identified.   Electronically Signed   By: Lars Pinks M.D.   On: 09/18/2013 17:50         Subjective: Patient denies any fevers, chills, chest discomfort, shortness breath, nausea, vomiting, abdominal pain. He continues to complain of loose stools. No hematochezia or melena.  Objective: Filed Vitals:   10/01/13 1130 10/01/13 1200 10/01/13 1210 10/01/13 1338  BP: 120/80 116/74 146/83 121/80  Pulse: 98 100 100 103  Temp:   98.8 F (37.1 C) 99.1 F  (37.3 C)  TempSrc:   Oral Oral  Resp:  25 23   Height:      Weight:   93.2 kg (205 lb 7.5 oz)   SpO2: 98% 100% 98% 92%    Intake/Output Summary (Last 24 hours) at 10/01/13 1554 Last data filed at 10/01/13 1210  Gross per 24 hour  Intake    630 ml  Output   3486 ml  Net  -2856 ml   Weight change: -2.6  kg (-5 lb 11.7 oz) Exam:   General:  Pt is alert, follows commands appropriately, not in acute distress  HEENT: No icterus, No thrush,  [place]/AT  Cardiovascular: RRR, S1/S2, no rubs, 2/6 systolic murmur left lower sternal border  Respiratory: CTA bilaterally, no wheezing, no crackles, no rhonchi  Abdomen: Soft/+BS, non tender, non distended, no guarding  Extremities: trace LE edema, No lymphangitis, No petechiae, No rashes, no synovitis  Data Reviewed: Basic Metabolic Panel:  Recent Labs Lab 09/28/13 0700 09/29/13 0410 09/29/13 0814 09/30/13 0443 10/01/13 0540  NA 139 133* 136* 137 137  K 4.0 3.5* 3.3* 3.8 4.0  CL 99 93* 95* 97 96  CO2 24 23 24 24 25   GLUCOSE 103* 139* 121* 91 106*  BUN 23 30* 32* 14 21  CREATININE 6.92* 8.51* 8.69* 5.19* 7.21*  CALCIUM 8.4 8.4 8.2* 8.2* 8.2*  PHOS  --   --  7.8*  --   --    Liver Function Tests:  Recent Labs Lab 09/25/13 0452 09/29/13 0814  AST 22  --   ALT 16  --   ALKPHOS 50  --   BILITOT 0.6  --   PROT 5.7*  --   ALBUMIN 1.4* 1.4*   No results found for this basename: LIPASE, AMYLASE,  in the last 168 hours No results found for this basename: AMMONIA,  in the last 168 hours CBC:  Recent Labs Lab 09/26/13 0404 09/27/13 0505 09/28/13 0700 09/29/13 0410 09/30/13 0443  WBC 16.8* 16.1* 18.3* 16.4* 14.2*  HGB 8.1* 8.9* 8.5* 8.6* 7.9*  HCT 23.2* 25.6* 24.5* 24.9* 22.7*  MCV 83.2 83.7 83.9 84.7 86.6  PLT 272 274 310 317 321   Cardiac Enzymes: No results found for this basename: CKTOTAL, CKMB, CKMBINDEX, TROPONINI,  in the last 168 hours BNP: No components found with this basename: POCBNP,  CBG:  Recent  Labs Lab 09/29/13 2113 09/30/13 0741 09/30/13 1236 09/30/13 1711 09/30/13 2133  GLUCAP 86 83 125* 117* 112*    Recent Results (from the past 240 hour(s))  CULTURE, BLOOD (ROUTINE X 2)     Status: None   Collection Time    09/24/13 12:44 AM      Result Value Ref Range Status   Specimen Description BLOOD RIGHT ARM   Final   Special Requests BOTTLES DRAWN AEROBIC ONLY 6CC   Final   Culture  Setup Time     Final   Value: 09/24/2013 03:21     Performed at [facility]     Final   Value: NO GROWTH 5 DAYS     Performed at [facility]   Report Status 09/30/2013 FINAL   Final  CULTURE, BLOOD (ROUTINE X 2)     Status: None   Collection Time    09/24/13 12:50 AM      Result Value Ref Range Status   Specimen Description BLOOD LEFT HAND   Final   Special Requests BOTTLES DRAWN AEROBIC ONLY 4CC   Final   Culture  Setup Time     Final   Value: 09/24/2013 03:21     Performed at [facility]   Culture     Final   Value: NO GROWTH 5 DAYS     Performed at [facility]   Report Status 09/30/2013 FINAL   Final  CLOSTRIDIUM DIFFICILE BY PCR     Status: None   Collection Time    09/27/13  5:07 PM  Result Value Ref Range Status   C difficile by pcr NEGATIVE  NEGATIVE Final     Scheduled Meds: . antiseptic oral rinse  15 mL Mouth Rinse q12n4p  . aspirin EC  81 mg Oral Daily  . chlorhexidine  15 mL Mouth Rinse BID  . [START ON 10/03/2013] darbepoetin (ARANESP) injection - DIALYSIS  60 mcg Intravenous Q Fri-HD  . feeding supplement (RESOURCE BREEZE)  1 Container Oral BID BM  . ferric gluconate (FERRLECIT/NULECIT) IV  125 mg Intravenous Weekly  . heparin subcutaneous  5,000 Units Subcutaneous 3 times per day  . insulin aspart  0-9 Units Subcutaneous TID WC  . levothyroxine  200 mcg Oral QAC breakfast  . multivitamin  1 tablet Oral QHS  . nafcillin IV  2 g Intravenous Q4H  . pantoprazole  40 mg Oral Q1200  . potassium chloride  20 mEq Oral  Daily  . rifampin  600 mg Oral Daily  . sevelamer carbonate  1.6 g Oral TID WC  . sodium chloride  3 mL Intravenous Q12H   Continuous Infusions:    Dameon Soltis, DO  [facility] Pager [phone]  If 7PM-7AM, please contact night-coverage [email or web address] Password [email or web address] 10/01/2013, 3:54 PM   LOS: 23 days

## 2013-10-01 NOTE — Progress Notes (Signed)
    [place] for Infectious Disease  Date of Admission:  09/08/2013  Antibiotics: Total antibiotics 22 days  Subjective: No acute events  Objective: Temp:  [98 F (36.7 C)-99.1 F (37.3 C)] 98.8 F (37.1 C) (03/04 1210) Pulse Rate:  [90-106] 100 (03/04 1210) Resp:  [19-28] 23 (03/04 1210) BP: (96-147)/(56-98) 146/83 mmHg (03/04 1210) SpO2:  [95 %-100 %] 98 % (03/04 1210) Weight:  [205 lb 7.5 oz (93.2 kg)-213 lb 13.5 oz (97 kg)] 205 lb 7.5 oz (93.2 kg) (03/04 1210)  General: alert, nad, not confused Skin: no rashes Lungs: CTA B Cor: RRR Abdomen: soft, nt, nd Ext:no edema  Lab Results Lab Results  Component Value Date   WBC 14.2* 09/30/2013   HGB 7.9* 09/30/2013   HCT 22.7* 09/30/2013   MCV 86.6 09/30/2013   PLT 321 09/30/2013    Lab Results  Component Value Date   CREATININE 7.21* 10/01/2013   BUN 21 10/01/2013   NA 137 10/01/2013   K 4.0 10/01/2013   CL 96 10/01/2013   CO2 25 10/01/2013    Lab Results  Component Value Date   ALT 16 09/25/2013   AST 22 09/25/2013   ALKPHOS 50 09/25/2013   BILITOT 0.6 09/25/2013      Microbiology: Recent Results (from the past 240 hour(s))  CULTURE, BLOOD (ROUTINE X 2)     Status: None   Collection Time    09/24/13 12:44 AM      Result Value Ref Range Status   Specimen Description BLOOD RIGHT ARM   Final   Special Requests BOTTLES DRAWN AEROBIC ONLY 6CC   Final   Culture  Setup Time     Final   Value: 09/24/2013 03:21     Performed at [facility]     Final   Value: NO GROWTH 5 DAYS     Performed at [facility]   Report Status 09/30/2013 FINAL   Final  CULTURE, BLOOD (ROUTINE X 2)     Status: None   Collection Time    09/24/13 12:50 AM      Result Value Ref Range Status   Specimen Description BLOOD LEFT HAND   Final   Special Requests BOTTLES DRAWN AEROBIC ONLY 4CC   Final   Culture  Setup Time     Final   Value: 09/24/2013 03:21     Performed at [facility]   Culture     Final   Value: NO  GROWTH 5 DAYS     Performed at [facility]   Report Status 09/30/2013 FINAL   Final  CLOSTRIDIUM DIFFICILE BY PCR     Status: None   Collection Time    09/27/13  5:07 PM      Result Value Ref Range Status   C difficile by pcr NEGATIVE  NEGATIVE Final    Studies/Results: No results found.  Assessment/Plan: 1) MSSA bacteremia and presumed endocarditis, septic emboli - No TEE planned.   Repeat blood cultures remained negative.  Continue with nafcillin and rifampin and will need for 6 weeks for presumed PVE through March 24th. No leukopenia or thrombocytopenia.  WBC a little improved today, down to 14,000.    I will follow up again on Friday  Trezure Cronk, [place] for Infectious Disease [place] [email or web address] [phone] pager   [phone] cell 10/01/2013, 1:08 PM

## 2013-10-01 NOTE — Progress Notes (Signed)
Renal Attending: There is no evidence of recovery of renal function and therefore it is probably reasonable to make plans for long term ESRD management. I am not sure of disposition but will plan to CLIP. Annmarie Plemmons C    Subjective:   Anxious today, but calms when talking to him. Wants to get some rest now. Wanted to dance yesterday when PT got him walking  Objective Filed Vitals:   10/01/13 0801 10/01/13 0806 10/01/13 0830 10/01/13 0900  BP: 139/98 138/86 114/67 147/89  Pulse: 90 100 91 101  Temp: 98 F (36.7 C)     TempSrc: Oral     Resp: 22     Height:      Weight:      SpO2: 97%      Physical Exam General: alert and interactive. Slightly anxious Heart: RRR 2/6 SEM  Lungs: CTA unlabored Abdomen: soft nontender +BS Extremities: trace ankle edema bilat Dialysis Access:  R IJ perm cath  Assessment/Plan: 1 Acute kidney injury- oliguric ATN from gent vs immune-complex disease from infection; 1st HD 2/18, cont MWF dialysis; has daily KCl 20 ordered - need to watch - K+ 4. 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 Tmax 98.6 WBC 11.5 -->18.3 today  3 AMS- improving, up with PT today  4 HTN/ vol excess- 127/78 cont to lower vol w HD. No BP meds  5 Hyperphosphatemia- Ca+ 8.2 Phos 7.8 renagel ac  6. Anemia- hgb7.9- on Aranesp  - Tsat 23- start low dose Iron next HD  7. Nutrition- Alb 1.4 renal diet Multivit. Nepro. Encourage protien  8 Debility- L hemiparesis    Shelle Iron, NP [place] [phone] 10/01/2013,9:30 AM  LOS: 23 days    Additional Objective Labs: Basic Metabolic Panel:  Recent Labs Lab 09/29/13 0814 09/30/13 0443 10/01/13 0540  NA 136* 137 137  K 3.3* 3.8 4.0  CL 95* 97 96  CO2 24 24 25   GLUCOSE 121* 91 106*  BUN 32* 14 21  CREATININE 8.69* 5.19* 7.21*  CALCIUM 8.2* 8.2* 8.2*  PHOS  7.8*  --   --    Liver Function Tests:  Recent Labs Lab 09/25/13 0452 09/29/13 0814  AST 22  --   ALT 16  --   ALKPHOS 50  --   BILITOT 0.6  --   PROT 5.7*  --   ALBUMIN 1.4* 1.4*   No results found for this basename: LIPASE, AMYLASE,  in the last 168 hours CBC:  Recent Labs Lab 09/26/13 0404 09/27/13 0505 09/28/13 0700 09/29/13 0410 09/30/13 0443  WBC 16.8* 16.1* 18.3* 16.4* 14.2*  HGB 8.1* 8.9* 8.5* 8.6* 7.9*  HCT 23.2* 25.6* 24.5* 24.9* 22.7*  MCV 83.2 83.7 83.9 84.7 86.6  PLT 272 274 310 317 321   Blood Culture    Component Value Date/Time   SDES BLOOD LEFT HAND 09/24/2013 0050   SPECREQUEST BOTTLES DRAWN AEROBIC ONLY 4CC 09/24/2013 0050   CULT  Value: NO GROWTH 5 DAYS Performed at [facility] 09/24/2013 0050   REPTSTATUS 09/30/2013 FINAL 09/24/2013 0050    Cardiac Enzymes: No results found for this basename: CKTOTAL, CKMB, CKMBINDEX, TROPONINI,  in the last 168 hours CBG:  Recent Labs Lab 09/29/13 2113 09/30/13 0741 09/30/13 1236 09/30/13 1711 09/30/13 2133  GLUCAP 86 83 125* 117* 112*   Iron Studies: No results found for this basename: IRON,  TIBC, TRANSFERRIN, FERRITIN,  in the last 72 hours @lablastinr3 @ Studies/Results: No results found. Medications:   . antiseptic oral rinse  15 mL Mouth Rinse q12n4p  . aspirin EC  81 mg Oral Daily  . chlorhexidine  15 mL Mouth Rinse BID  . [START ON 10/03/2013] darbepoetin (ARANESP) injection - DIALYSIS  60 mcg Intravenous Q Fri-HD  . feeding supplement (RESOURCE BREEZE)  1 Container Oral BID BM  . ferric gluconate (FERRLECIT/NULECIT) IV  125 mg Intravenous Weekly  . heparin subcutaneous  5,000 Units Subcutaneous 3 times per day  . insulin aspart  0-9 Units Subcutaneous TID WC  . levothyroxine  200 mcg Oral QAC breakfast  . multivitamin  1 tablet Oral QHS  . nafcillin IV  2 g Intravenous Q4H  . pantoprazole  40 mg Oral Q1200  . potassium chloride  20 mEq Oral Daily  . rifampin  600 mg Oral Daily  .  sevelamer carbonate  1.6 g Oral TID WC  . sodium chloride  3 mL Intravenous Q12H

## 2013-10-01 NOTE — Progress Notes (Signed)
Physical Therapy Treatment Patient Details Name: Bradley [name] MRN: [id number] DOB: 1956/05/08 Today's Date: 10/01/2013 Time: [id number] PT Time Calculation (min): 24 min  PT Assessment / Plan / Recommendation  History of Present Illness Pt admit with sepsis and r/o endocarditis.  Anoxic brain injury.   PT Comments   Patient fatigued prior to treatment due to episode of incontinence waiting for help to toilet.  Feel his cognitive status worsens with fatigue as well in terms of response to commands.  Gave 2 step command to sit and pt listed only to last step.  Will need interdisciplinary rehab at d/c.  Follow Up Recommendations  CIR     Does the patient have the potential to tolerate intense rehabilitation   yes  Barriers to Discharge  None      Equipment Recommendations   (TBA)    Recommendations for Other Services Rehab consult  Frequency Min 3X/week   Progress towards PT Goals Progress towards PT goals: Progressing toward goals  Plan Current plan remains appropriate    Precautions / Restrictions Precautions Precautions: Fall   Pertinent Vitals/Pain No pain compliants    Mobility  Bed Mobility Overal bed mobility: Needs Assistance Sit to supine: Min assist General bed mobility comments: assist for feet into bed Transfers Overall transfer level: Needs assistance Equipment used: Rolling walker (2 wheeled) Transfers: Sit to/from Stand Sit to Stand: Min assist General transfer comment: pt counting and using momentum to stand; attempted sit<>stand for LE strength; pt performed x 1 and ready to lie down Ambulation/Gait Ambulation/Gait assistance: Min assist Ambulation Distance (Feet): 70 Feet Assistive device: Rolling walker (2 wheeled) Gait Pattern/deviations: Step-through pattern;Decreased stride length;Shuffle;Leaning posteriorly General Gait Details: HR 124 with ambulation; assist for walker direction/maneuvering around obstacles and for posture; walking too far close  to front of walker with posterior bias      PT Goals (current goals can now be found in the care plan section)    Visit Information  Last PT Received On: 10/01/13 Assistance Needed: +1 History of Present Illness: Pt admit with sepsis and r/o endocarditis.  Anoxic brain injury.    Subjective Data   Can I scoot around now?   Cognition  Cognition Arousal/Alertness: Awake/alert Behavior During Therapy: Flat affect Overall Cognitive Status: Impaired/Different from baseline Orientation Level: Time;Situation Current Attention Level: Sustained Following Commands: Follows one step commands with increased time Safety/Judgement: Decreased awareness of safety Problem Solving: Slow processing;Decreased initiation;Difficulty sequencing;Requires verbal cues;Requires tactile cues General Comments: Delayed processing time      Balance  Balance Sitting balance-Leahy Scale: Good Standing balance-Leahy Scale: Poor Standing balance comment: leans back needing assist for balance/safety  End of Session PT - End of Session Equipment Utilized During Treatment: Gait belt Activity Tolerance: Patient limited by fatigue Patient left: in bed;with call bell/phone within reach;with bed alarm set;with family/visitor present   GP     [facility] 10/01/2013, 3:18 PM [name], [place] 10/01/2013

## 2013-10-01 NOTE — Progress Notes (Signed)
SLP Cancellation Note  Patient Details Name: Bradley [name] MRN: [id number] DOB: May 22, 1956   Cancelled treatment:        Unable to see pt at this time, as pt is in dialysis.  Will continue efforts.  Juelz Claar B. Quentin Ore [facility], CCC-SLP [id number]  Shonna Chock 10/01/2013, 10:47 AM

## 2013-10-02 DIAGNOSIS — Z01810 Encounter for preprocedural cardiovascular examination: Secondary | ICD-10-CM

## 2013-10-02 LAB — GLUCOSE, CAPILLARY
Glucose-Capillary: 105 mg/dL — ABNORMAL HIGH (ref 70–99)
Glucose-Capillary: 102 mg/dL — ABNORMAL HIGH (ref 70–99)
Glucose-Capillary: 105 mg/dL — ABNORMAL HIGH (ref 70–99)

## 2013-10-02 LAB — PROTIME-INR
INR: 1.25 (ref 0.00–1.49)
Prothrombin Time: 15.4 seconds — ABNORMAL HIGH (ref 11.6–15.2)

## 2013-10-02 NOTE — Progress Notes (Signed)
Pharmacy aware that patient missed the majority of his 8am nafcillin dose due to loss of IV access.  Bradley [name], Bradley [name]

## 2013-10-02 NOTE — Plan of Care (Signed)
Problem: Phase II Progression Outcomes Goal: Urinary output increased Outcome: Not Progressing Started on Hemo

## 2013-10-02 NOTE — Progress Notes (Signed)
Right  Upper Extremity Vein Map   There is acute superficial thrombosis noted in the right cephalic vein and in the left cephalic and basilic veins.   Cephalic  Segment Diameter Depth Comment  1. Axilla mm mm   2. Mid upper arm mm mm thrombosed  3. Above AC mm mm thrombosed  4. In AC mm mm thrombosed  5. Below AC 2.79mm mm   6. Mid forearm mm mm   7. Wrist mm mm    mm mm    mm mm    mm mm    Basilic  Segment Diameter Depth Comment  1. Axilla mm mm   2. Mid upper arm mm mm   3. Above AC 2.38mm 2.24mm   4. In [facility] 2.49mm 2.63mm   5. Below AC 4.102mm 7.69mm Multiple branches  6. Mid forearm 3.51mm 6.95mm   7. Wrist mm mm    mm mm    mm mm    mm mm   Left Upper Extremity Vein Map    Cephalic  Segment Diameter Depth Comment  1. Axilla mm mm   2. Mid upper arm mm mm thrombosis  3. Above AC 3.92mm mm   4. In AC 4.47mm mm   5. Below AC mm mm Not visualized  6. Mid forearm mm mm   7. Wrist mm mm    mm mm    mm mm    mm mm    Basilic  Segment Diameter Depth Comment  1. Axilla mm mm   2. Mid upper arm mm mm thrombosis  3. Above AC 3.59mm 9.53mm   4. In AC 3.63mm 7.44mm   5. Below AC 3.28mm 6.29mm   6. Mid forearm 2.32mm 6.41mm   7. Wrist 3.32mm 29mm   Brachial       Left AC 4.21mm 30mm   Above left AC 4.67mm 24mm   Mid left upper arm 5.35mm 3mm

## 2013-10-02 NOTE — Progress Notes (Addendum)
Assessment/Plan:  1 Oliguric AKI, now ESRD 2 Staph aureus bacteremia / embolic strokes / bioprosthetic AVR 2011-   Plan: For permanent access; HD Fri  Subjective: Interval History: apprec VVS  Objective: Vital signs in last 24 hours: Temp:  [98 F (36.7 C)-99.8 F (37.7 C)] 98 F (36.7 C) (03/05 1047) Pulse Rate:  [90-103] 90 (03/05 1047) Resp:  [18-22] 18 (03/05 1047) BP: (107-142)/(78-88) 142/88 mmHg (03/05 1047) SpO2:  [90 %-99 %] 99 % (03/05 1047) Weight:  [95.3 kg (210 lb 1.6 oz)] 95.3 kg (210 lb 1.6 oz) (03/05 0430) Weight change: -3.8 kg (-8 lb 6 oz)  Intake/Output from previous day: 03/04 0701 - 03/05 0700 In: -  Out: 3485  Intake/Output this shift: Total I/O In: 120 [P.O.:120] Out: 2 [Stool:2]  no change, remains confused  Lab Results:  Recent Labs  09/30/13 0443  WBC 14.2*  HGB 7.9*  HCT 22.7*  PLT 321   BMET:  Recent Labs  09/30/13 0443 10/01/13 0540  NA 137 137  K 3.8 4.0  CL 97 96  CO2 24 25  GLUCOSE 91 106*  BUN 14 21  CREATININE 5.19* 7.21*  CALCIUM 8.2* 8.2*   No results found for this basename: PTH,  in the last 72 hours Iron Studies: No results found for this basename: IRON, TIBC, TRANSFERRIN, FERRITIN,  in the last 72 hours Studies/Results: No results found.  Scheduled: . antiseptic oral rinse  15 mL Mouth Rinse q12n4p  . aspirin EC  81 mg Oral Daily  . chlorhexidine  15 mL Mouth Rinse BID  . [START ON 10/03/2013] darbepoetin (ARANESP) injection - DIALYSIS  60 mcg Intravenous Q Fri-HD  . feeding supplement (RESOURCE BREEZE)  1 Container Oral BID BM  . ferric gluconate (FERRLECIT/NULECIT) IV  125 mg Intravenous Weekly  . heparin subcutaneous  5,000 Units Subcutaneous 3 times per day  . insulin aspart  0-9 Units Subcutaneous TID WC  . levothyroxine  200 mcg Oral QAC breakfast  . multivitamin  1 tablet Oral QHS  . nafcillin IV  2 g Intravenous Q4H  . pantoprazole  40 mg Oral Q1200  . rifampin  600 mg Oral Daily  . sevelamer  carbonate  1.6 g Oral TID WC  . sodium chloride  3 mL Intravenous Q12H    LOS: 24 days   Edem Tiegs C 10/02/2013,12:53 PM

## 2013-10-02 NOTE — Progress Notes (Signed)
[facility] PROGRESS NOTE  Bradley [name] [id number] DOB: 01-Mar-1956 DOA: 09/08/2013 PCP: [name], MD  Assessment/Plan: Severe sepsis secondary to MSSA bacteremia w/ sepsis / infective endocarditis w/ cerebral septic emboli / UTI  -09/08/13 Blood cultures x 2 MSSA - follow up cultures negative -09/08/13 UA cx with MSSA UTI  -TTE (09/09/13) without vegetation  -Cardiology/ID recommend 6 weeks of antibiotics for presumed endocarditis through October 21, 2013, no TEE at this time  -has prior tissue AVR  -ID directing antibiotics, currently on naficillin and rifampin  -Patient continues to have low grade fever, however, leukocytosis trending downward  -Cdiff PCR negative on 2/16 and 09/16/13  And 09/27/13 -Blood cultures from 09/10/13, 09/24/13, show no growth to date  Toxic metabolic encephalopathy  -gradually improving  -secondary to severe sepsis, azotemia, and septic brain emboli  -MRI Brain (09/09/13) with multiple bilateral supra tentorial and right cerebellar regions consistent with acute partially hemorrhagic infarcts suspected secondary to septic emboli  -Continue supportive care and treat underlying causes  -Neurology has evaluated  -QTc prolonged so haldol not safe  -[id number], folic acid [id number], and ammonia 17  Severe thrombocytopenia  -improved with tx of infectious process  -Resolved, Platelet counts dropped to a nadir of 23  -felt to be due to sepsis  Acute Renal Failure/Acute tubular necrosis-->ESRD  -due to bacteremia/immune complex or gentamicin  -baseline renal fnx: 18 / 0.93  -progressive failure since admit  -Transferred to [name] 09/15/2013 for hemodialysis  -Hemodialysis started 09/17/2013  -Renal US normal kidneys, no hydronephrosis  -appreciate renal followup  -AVF/graft planned 10/03/13 Abdominal distention/diarrhea  -distention improved  -diarrhea likely due to antibiotics, which has resolved  -C diff negative 2/16 and 2/17, 2/28  NSTEMI (Type II)   -peak Tropinin 2.26  -Cardiology has no plans for cath at this point  -has stable ST elevation r/t LBBB  Acute Systolic HF and Chronic grade 1 Diastolic CHF  -ECHO Oct 123456 with EF 60-65% with severe LVH and no RWMA  -ECHO Sep 09, 2013 EF 45-50% with septal and apical hypokinesis  History of thyroid cancer, post thyroidectomy hypothyroidism  -TSH elevated at 10.174  -Synthroid increased  -Will need monitoring and repeat labs in 4-6 weeks  Hypertension  -Holding lisinopril due to acute renal failure  -Continue metoprolol IV PRN  Hyperglycemia  -Hemoglobin A1c 5.7  -d/c CBG monitoring and ISS History of depression  -hold Zoloft secondary to acute encephalopathy.  Anxiety/Insomnia  -Will discontinue ambien  -Continue Klonopin QHS PRN, and Ativan daily PRN  -Continue to monitor  Hyponatremia  -Resolved, due to volume overload  -corrected after initiation of hemodialysis  Deconditioning  -CIR reconsulted, and pending evaluation  Code Status: Full  Family Communication: wife and son updated on 10/01/13 Disposition Plan: SNF vs CIR  Procedures/Course  2/09 - admit  2/10 - ID, Heme and Cards consults. TTE EF 45%. No clear vegetations  2/14 - PCCM consult  2/15 - Junctional rhythm with precedex, mental status improved. Transferred to [facility] for possible HD  2/16 - Renal U/S- normal kidneys, no hydronephrosis  2/18 - transferred to ICU for diaphoresis, ST changes and confusion  2/21 - patient pulled out L IJ HD catheter that was placed on 09/17/13  2/21 - L [place] HD catheter placed  2/26 - Conversion of R IJ temp cath to [facility] cath  [facility]  Nephrology  PCCM  Infectious disease  Interventional radiology  Cardiology  Neurology  Hematology  CIR  DVT Prophylaxis Heparin  Procedures/Studies: Dg Chest 2 View  09/06/2013   CLINICAL DATA:  Fever, nausea, vomiting.  EXAM: CHEST  2 VIEW  COMPARISON:  08/17/2011  FINDINGS: Prior median sternotomy and valve replacement. Mild  cardiomegaly and vascular congestion. Low lung volumes with bibasilar atelectasis. No effusions. No acute bony abnormality.  IMPRESSION: Cardiomegaly, bibasilar atelectasis.   Electronically Signed   By: Rolm Baptise M.D.   On: 09/06/2013 19:34   Dg Abd 1 View  09/17/2013   CLINICAL DATA:  Abdominal distension  EXAM: ABDOMEN - 1 VIEW  COMPARISON:  CT ABD/PELVIS W CM dated 09/06/2013  FINDINGS: There is at least moderate gas distention of multiple loops of predominantly small bowel, though there may be minimal gaseous distention of the splenic flexure of the colon.  Examination is degraded secondary to the coned field-of-view on solitary provided radiograph.  Nondiagnostic evaluation for pneumoperitoneum secondary supine positioning and exclusion of the lower thorax. No definite pneumatosis or portal venous gas.  No definite abnormal intra-abdominal calcifications.  Degenerative change the lower lumbar spine.  IMPRESSION: Degraded examination with findings concerning for small bowel obstruction though there may be a minimal amount of air within the splenic flexure of the colon. Clinical correlation is advised. Further evaluation with complete abdominal radiographic series may be performed as clinically indicated.   Electronically Signed   By: Sandi Mariscal M.D.   On: 09/17/2013 10:38   Ct Head Wo Contrast  09/12/2013   CLINICAL DATA:  Increased mental status change, evaluate for CVA versus worsening bleed.  EXAM: CT HEAD WITHOUT CONTRAST  TECHNIQUE: Contiguous axial images were obtained from the base of the skull through the vertex without intravenous contrast.  COMPARISON:  Prior MRI and and CT from 09/09/2013.  FINDINGS: Study is degraded by motion artifact.  Vasogenic edema as trending linear hyperdensity within the left parietal lobe is stable not significantly changed relative to the previous examination. Vague hypodensities involving the supratentorial brain and right cerebellar hemisphere are not  significantly changed relative to recent MRI. No definite a new intracranial infarct identified. Previously seen hyperdense foci within the mid bilateral frontal lobes are less conspicuous as compared to prior. No new intracranial hemorrhage. No mass lesion or midline shift. Ventricles are stable in size without evidence of hydrocephalus. No extra-axial fluid collection.  The calvarium is grossly intact. Orbits are normal. Paranasal sinuses and mastoid air cells remain clear.  IMPRESSION: 1. Limited study due to motion. No significant interval change in subacute left parietal infarct with small amount of associated hemorrhage. Additional multi focal infarcts involving the supratentorial brain and right cerebellar hemisphere are not significantly changed as well relative to recent MRI from 09/09/2013. No definite new intracranial infarct identified. No new intracranial hemorrhage.   Electronically Signed   By: Jeannine Boga M.D.   On: 09/12/2013 22:02   Ct Head Wo Contrast  09/09/2013   CLINICAL DATA:  Fever.  Altered mental status.  EXAM: CT HEAD WITHOUT CONTRAST  TECHNIQUE: Contiguous axial images were obtained from the base of the skull through the vertex without intravenous contrast.  COMPARISON:  CT HEAD W/O CM dated 09/06/2013; DG CHEST 1V PORT dated 09/08/2013; CT ABD/PELVIS W CM dated 09/06/2013  FINDINGS: Small focus of linear high attenuation in the left parietal lobe appear similar to the prior exam. The amount of edema at has increased compared to the prior exam suggesting a subacute process. This appears wedge-shaped in may represent an embolic infarct. There is a similar faint area of high attenuation  in the left frontal lobe on image number 22 low with a more subtle area in the right frontal lobe that is more equivocal. The dural venous sinuses appear within normal limits. No mass lesion or midline shift. No hydrocephalus.  The calvarium is intact. Left-sided nasal septal spur. Mastoid air cells  clear. Paranasal sinuses are normal.  IMPRESSION: 1. Increasing edema around linear high attenuation in the left parietal lobe. Followup MRI with and without contrast is recommended for further assessment. The patient presents with an infectious syndrome and septic emboli are in the differential considerations. Infarct with a small amount of hemorrhage or cortical laminar necrosis as well as developmental venous anomaly remain in the differential considerations. MRI should be useful in further characterization. 2. Dr. Leonel Ramsay called discuss the case at 0230 hr on the day of examination.   Electronically Signed   By: Dereck Ligas M.D.   On: 09/09/2013 02:32   Ct Head Wo Contrast  09/06/2013   CLINICAL DATA:  Confusion.  EXAM: CT HEAD WITHOUT CONTRAST  TECHNIQUE: Contiguous axial images were obtained from the base of the skull through the vertex without intravenous contrast.  COMPARISON:  None.  FINDINGS: There is a 1.3 cm curvilinear focus of hyperdensity in the subcortical left parietal lobe (image 25). There is no evidence of surrounding edema. There is no other evidence of acute intracranial hemorrhage. There is no evidence of acute cortical infarct, mass, midline shift, or extra-axial fluid collection. Subcentimeter low density focus in the left caudate is suggestive of a remote lacunar infarct. Ventricles and sulci are normal. There is very mild mucosal thickening in the frontal sinuses and ethmoid air cells bilaterally. Mastoid air cells are clear. Orbits are unremarkable.  IMPRESSION: 1. Curvilinear hyperdensity in the subcortical left parietal lobe. Acute intracranial hemorrhage cannot be completely excluded, however there is no surrounding edema or evidence of hemorrhage elsewhere in the brain and the patient is not presenting in the setting of acute trauma. Given the curvilinear configuration, this may be vascular and represent an incidental developmental venous anomaly. 2. Remote left caudate  lacunar infarct.   Electronically Signed   By: Logan Bores   On: 09/06/2013 22:29   Mr Brain W Wo Contrast  09/09/2013   CLINICAL DATA:  Post aortic valve replacement 2011. Presenting with altered mental status, elevated white count and abnormal CT.  EXAM: MRI HEAD WITHOUT AND WITH CONTRAST  TECHNIQUE: Multiplanar, multiecho pulse sequences of the brain and surrounding structures were obtained without and with intravenous contrast.  CONTRAST:  52mL MULTIHANCE GADOBENATE DIMEGLUMINE 529 MG/ML IV SOLN  COMPARISON:  09/09/2013 and 09/06/2013 CT.  No comparison MR.  FINDINGS: Exam is motion degraded.  Multiple bilateral supra tentorial and right cerebellar regions of restricted motion some of which are partially hemorrhagic with largest area left parietal lobe. In the present clinical setting, these findings are consistent with acute partially hemorrhagic infarcts and possibly related to septic emboli.  At most there is very minimal enhancement of the left parietal partially hemorrhagic infarct.  No intracranial mass lesion seen separate from the above described findings.  Major intracranial vascular structures are patent.  IMPRESSION: Exam is motion degraded.  Multiple bilateral supra tentorial and right cerebellar regions of restricted motion some of which are partially hemorrhagic with largest area left parietal lobe. In the present clinical setting, these findings are consistent with acute partially hemorrhagic infarcts and possibly related to septic emboli.  These results were called by telephone at the time of interpretation  on 09/09/2013 at 11:54 AM to Dr. Algis Liming , who verbally acknowledged these results.   Electronically Signed   By: Chauncey Cruel M.D.   On: 09/09/2013 12:03   Ct Abdomen Pelvis W Contrast  09/06/2013   CLINICAL DATA:  58 year old male abdominal pain, vomiting, diarrhea and low grade fever.  EXAM: CT ABDOMEN AND PELVIS WITH CONTRAST  TECHNIQUE: Multidetector CT imaging of the abdomen and  pelvis was performed using the standard protocol following bolus administration of intravenous contrast.  CONTRAST:  100 cc intravenous Omnipaque 300  COMPARISON:  None.  FINDINGS: Cardiomegaly and aortic valve replacement noted. Mild bibasilar atelectasis/scarring is identified.  The liver, spleen, pancreas, gallbladder adrenal glands and kidneys are unremarkable except for small bilateral renal cysts.  There is no evidence of free fluid, enlarged lymph nodes, biliary dilation or abdominal aortic aneurysm.  The bowel, bladder and appendix are unremarkable. There is no evidence of bowel obstruction, abscess or pneumoperitoneum.  A small right inguinal hernia containing fat is identified.  Mild prostate enlargement is present.  No acute or suspicious bony abnormalities are identified.  IMPRESSION: No evidence of acute abnormality.  Cardiomegaly, aortic valve replacement, prostate enlargement and small right inguinal hernias containing fat.   Electronically Signed   By: Hassan Rowan M.D.   On: 09/06/2013 22:32   Ir Veno/ext/uni Right  09/14/2013   CLINICAL DATA:  Poor venous access, request for PICC  EXAM: DUAL LUMEN LEFT MIDLINE VENOUS CATHETER PLACEMENT WITH ULTRASOUND AND FLUOROSCOPIC GUIDANCE  ADDITIONAL VENOGRAPHY OF THE RIGHT UPPER EXTREMITY  FLUOROSCOPY TIME:  7 MIN AND 30 SECONDS.  PROCEDURE: The patient's family member was advised of the possible risks and complications and agreed to undergo the procedure. The patient was then brought to the angiographic suite for the procedure.  The right and left arm was prepped with chlorhexidine, draped in the usual sterile fashion using maximum barrier technique (cap and mask, sterile gown, sterile gloves, large sterile sheet, hand hygiene and cutaneous antisepsis) and infiltrated locally with 1% Lidocaine.  Ultrasound demonstrated patency of the right and left brachial vein, and this was documented with an image. Under real-time ultrasound guidance, the right brachial  vein was accessed with a 21 gauge micropuncture needle and image documentation was performed. A 0.018 wire was introduced in to the vein. Over this, a dilator introducer was placed, the wire was unable to be advanced proximally so a venogram was performed with 10 cc of Omnipaque, this revealed venous extravasation. The right brachial vein site was abandoned and manual pressure was held until the site was soft with no continued bleeding. The left brachial vein was accessed with a 21 gauge micropuncture needle and multiple guidewires used to advance to the SVC/RA junction. A 5 French dual lumen power-injectable PICC was advanced, however unable to cross proximally to the lower SVC/right atrial junction, the PICC was left midline for this reason. Fluoroscopy during the procedure and fluoro spot radiograph confirms appropriate catheter position. The catheter aspirated and flushed well and was then covered with a sterile dressing.  Complications: Extravasation of 10 cc of Omnipaque to right brachial vein. Unable to advance left brachial vein PICC to the SVC/RA junction, left brachial PICC was placed midline.  IMPRESSION: Successful left arm midline VENOUS CATHETER placement with ultrasound and fluoroscopic guidance. The catheter is ready for use.  Read By:  Tsosie Billing PA-C   Electronically Signed   By: Aletta Edouard M.D.   On: 09/14/2013 11:51   US Renal  09/15/2013  CLINICAL DATA:  Acute renal failure.  EXAM: RENAL/URINARY TRACT ULTRASOUND COMPLETE  COMPARISON:  CT abdomen and pelvis 09/06/2013.  FINDINGS: Right Kidney:  Length: Approximately 12.7 cm. No hydronephrosis. Well-preserved cortex. No shadowing calculi. Normal parenchymal echotexture without focal abnormalities.  Left Kidney:  Length: Approximately 12.5 cm. No hydronephrosis. Well-preserved cortex. No shadowing calculi. Normal parenchymal echotexture without focal abnormalities.  Bladder:  Decompressed by Foley catheter.  IMPRESSION: Normal kidneys.   Specifically, no evidence of hydronephrosis.   Electronically Signed   By: Evangeline Dakin M.D.   On: 09/15/2013 10:55   Ir Fluoro Guide Cv Line Left  09/14/2013   CLINICAL DATA:  Poor venous access, request for PICC  EXAM: DUAL LUMEN LEFT MIDLINE VENOUS CATHETER PLACEMENT WITH ULTRASOUND AND FLUOROSCOPIC GUIDANCE  ADDITIONAL VENOGRAPHY OF THE RIGHT UPPER EXTREMITY  FLUOROSCOPY TIME:  7 MIN AND 30 SECONDS.  PROCEDURE: The patient's family member was advised of the possible risks and complications and agreed to undergo the procedure. The patient was then brought to the angiographic suite for the procedure.  The right and left arm was prepped with chlorhexidine, draped in the usual sterile fashion using maximum barrier technique (cap and mask, sterile gown, sterile gloves, large sterile sheet, hand hygiene and cutaneous antisepsis) and infiltrated locally with 1% Lidocaine.  Ultrasound demonstrated patency of the right and left brachial vein, and this was documented with an image. Under real-time ultrasound guidance, the right brachial vein was accessed with a 21 gauge micropuncture needle and image documentation was performed. A 0.018 wire was introduced in to the vein. Over this, a dilator introducer was placed, the wire was unable to be advanced proximally so a venogram was performed with 10 cc of Omnipaque, this revealed venous extravasation. The right brachial vein site was abandoned and manual pressure was held until the site was soft with no continued bleeding. The left brachial vein was accessed with a 21 gauge micropuncture needle and multiple guidewires used to advance to the SVC/RA junction. A 5 French dual lumen power-injectable PICC was advanced, however unable to cross proximally to the lower SVC/right atrial junction, the PICC was left midline for this reason. Fluoroscopy during the procedure and fluoro spot radiograph confirms appropriate catheter position. The catheter aspirated and flushed well  and was then covered with a sterile dressing.  Complications: Extravasation of 10 cc of Omnipaque to right brachial vein. Unable to advance left brachial vein PICC to the SVC/RA junction, left brachial PICC was placed midline.  IMPRESSION: Successful left arm midline VENOUS CATHETER placement with ultrasound and fluoroscopic guidance. The catheter is ready for use.  Read By:  Tsosie Billing PA-C   Electronically Signed   By: Aletta Edouard M.D.   On: 09/14/2013 11:51   Ir Fluoro Guide Cv Line Right  09/25/2013   INDICATION: Acute renal failure. Temporary catheter needs conversion to a tunneled dialysis catheter.  EXAM: CONVERSION OF NON TUNNELED CATHETER TO A TUNNELED DIALYSIS CATHETER WITH FLUOROSCOPIC GUIDANCE  Physician: Stephan Minister. Anselm Pancoast, MD  MEDICATIONS: 3 mg versed, 150 mcg fentanyl. The patient was already on scheduled antibiotics. A radiology nurse monitored the patient for moderate sedation.  ANESTHESIA/SEDATION: Moderate sedation time: 25 minutes  FLUOROSCOPY TIME:  24 seconds  PROCEDURE: Informed consent was obtained for conversion to a tunneled dialysis catheter. The existing right jugular catheter and right side of the neck was prepped and draped in sterile fashion. Maximal barrier sterile technique was utilized including caps, mask, sterile gowns, sterile gloves, sterile drape, hand hygiene  and skin antiseptic. The skin below the right clavicle was anesthetized with 1% lidocaine. A subcutaneous tract was created and a 23 cm tip to cuff HemoSplit catheter was placed through the tract to the temporary dialysis catheter site. The temporary dialysis catheter was exchanged for a peel-away sheath over a wire. The new dialysis catheter was placed through the peel-away sheath and the tip was placed in the right atrium. Both lumens aspirated and flushed well. Appropriate amount of heparin was placed in both lumens. Gelfoam was placed within the subcutaneous tract. The vein skin site and catheter exit site were  sutured with 2-0 Ethilon sutures.  COMPLICATIONS: None  FINDINGS: Catheter tip in the right atrium.  IMPRESSION: Successful conversion of a temporary dialysis catheter to a tunneled dialysis catheter with fluoroscopy.   Electronically Signed   By: Markus Daft M.D.   On: 09/25/2013 16:25   Ir Fluoro Guide Cv Line Right  09/21/2013   CLINICAL DATA:  Renal failure  EXAM: IR RIGHT FLOURO GUIDE CV LINE; IR ULTRASOUND GUIDANCE VASC ACCESS RIGHT  MEDICATIONS AND MEDICAL HISTORY: None  ANESTHESIA/SEDATION: None  CONTRAST:  None  FLUOROSCOPY TIME:  18 seconds.  PROCEDURE: The procedure, risks, benefits, and alternatives were explained to the patient. Questions regarding the procedure were encouraged and answered. The patient understands and consents to the procedure.  The right neck was prepped with Betadine in a sterile fashion, and a sterile drape was applied covering the operative field. A sterile gown and sterile gloves were used for the procedure.  Under sonographic guidance, a micropuncture needle was inserted into the right internal jugular vein and removed over a 018 wire which was up sized to a 3 J. the 20 cm temporary dialysis catheter was advanced over the wire to the right atrium. It was flushed and sewn in place.  COMPLICATIONS: None  FINDINGS: Tip of the temporary dialysis catheter is at the right atrium  IMPRESSION: Successful placement of a temporary right internal jugular vein dialysis catheter.   Electronically Signed   By: Maryclare Bean M.D.   On: 09/21/2013 12:15   Ir US Guide Vasc Access Left  09/14/2013   CLINICAL DATA:  Poor venous access, request for PICC  EXAM: DUAL LUMEN LEFT MIDLINE VENOUS CATHETER PLACEMENT WITH ULTRASOUND AND FLUOROSCOPIC GUIDANCE  ADDITIONAL VENOGRAPHY OF THE RIGHT UPPER EXTREMITY  FLUOROSCOPY TIME:  7 MIN AND 30 SECONDS.  PROCEDURE: The patient's family member was advised of the possible risks and complications and agreed to undergo the procedure. The patient was then brought  to the angiographic suite for the procedure.  The right and left arm was prepped with chlorhexidine, draped in the usual sterile fashion using maximum barrier technique (cap and mask, sterile gown, sterile gloves, large sterile sheet, hand hygiene and cutaneous antisepsis) and infiltrated locally with 1% Lidocaine.  Ultrasound demonstrated patency of the right and left brachial vein, and this was documented with an image. Under real-time ultrasound guidance, the right brachial vein was accessed with a 21 gauge micropuncture needle and image documentation was performed. A 0.018 wire was introduced in to the vein. Over this, a dilator introducer was placed, the wire was unable to be advanced proximally so a venogram was performed with 10 cc of Omnipaque, this revealed venous extravasation. The right brachial vein site was abandoned and manual pressure was held until the site was soft with no continued bleeding. The left brachial vein was accessed with a 21 gauge micropuncture needle and multiple guidewires used to  advance to the SVC/RA junction. A 5 French dual lumen power-injectable PICC was advanced, however unable to cross proximally to the lower SVC/right atrial junction, the PICC was left midline for this reason. Fluoroscopy during the procedure and fluoro spot radiograph confirms appropriate catheter position. The catheter aspirated and flushed well and was then covered with a sterile dressing.  Complications: Extravasation of 10 cc of Omnipaque to right brachial vein. Unable to advance left brachial vein PICC to the SVC/RA junction, left brachial PICC was placed midline.  IMPRESSION: Successful left arm midline VENOUS CATHETER placement with ultrasound and fluoroscopic guidance. The catheter is ready for use.  Read By:  Tsosie Billing PA-C   Electronically Signed   By: Aletta Edouard M.D.   On: 09/14/2013 11:51   Ir US Guide Vasc Access Right  09/21/2013   CLINICAL DATA:  Renal failure  EXAM: IR RIGHT FLOURO  GUIDE CV LINE; IR ULTRASOUND GUIDANCE VASC ACCESS RIGHT  MEDICATIONS AND MEDICAL HISTORY: None  ANESTHESIA/SEDATION: None  CONTRAST:  None  FLUOROSCOPY TIME:  18 seconds.  PROCEDURE: The procedure, risks, benefits, and alternatives were explained to the patient. Questions regarding the procedure were encouraged and answered. The patient understands and consents to the procedure.  The right neck was prepped with Betadine in a sterile fashion, and a sterile drape was applied covering the operative field. A sterile gown and sterile gloves were used for the procedure.  Under sonographic guidance, a micropuncture needle was inserted into the right internal jugular vein and removed over a 018 wire which was up sized to a 3 J. the 20 cm temporary dialysis catheter was advanced over the wire to the right atrium. It was flushed and sewn in place.  COMPLICATIONS: None  FINDINGS: Tip of the temporary dialysis catheter is at the right atrium  IMPRESSION: Successful placement of a temporary right internal jugular vein dialysis catheter.   Electronically Signed   By: Maryclare Bean M.D.   On: 09/21/2013 12:15   Dg Chest Port 1 View  09/18/2013   CLINICAL DATA:  Respiratory distress, shortness of breath.  EXAM: PORTABLE CHEST - 1 VIEW  COMPARISON:  09/17/2013  FINDINGS: Prior CABG and valve replacement. Left pass catheterization is in stable position. Cardiomegaly. Left lower lobe opacity, likely atelectasis. No effusions or overt edema.  IMPRESSION: Cardiomegaly.  Left lower lobe atelectasis.   Electronically Signed   By: Rolm Baptise M.D.   On: 09/18/2013 06:27   Dg Chest Port 1 View  09/17/2013   CLINICAL DATA:  Hemodialysis catheter placement  EXAM: PORTABLE CHEST - 1 VIEW  COMPARISON:  DG CHEST 1V PORT dated 09/17/2013; DG CHEST 1V PORT dated 09/13/2013; DG CHEST 1V PORT dated 09/08/2013  FINDINGS: Grossly unchanged enlarged cardiac silhouette and mediastinal contours post median sternotomy and valve replacement. Interval  placement of left jugular approach dialysis catheter with tip projected over the superior aspect of the SVC. Lung volumes remain reduced. Pulmonary vasculature remains indistinct. Grossly unchanged perihilar and bibasilar heterogeneous / consolidative opacities, left greater than right. No definite pleural effusion or pneumothorax. Grossly unchanged bones including postsurgical/traumatic deformity of the distal end of the right clavicle.  IMPRESSION: 1. Interval placement of left jugular approach central venous catheter with tip projected over the superior SVC. No pneumothorax. 2. Similar findings of pulmonary edema and perihilar/bibasilar opacities, left greater than right, atelectasis versus infiltrate.   Electronically Signed   By: Sandi Mariscal M.D.   On: 09/17/2013 10:41   Dg Chest Port 1  View  09/17/2013   CLINICAL DATA:  Confirm PICC line placement.  EXAM: PORTABLE CHEST - 1 VIEW  COMPARISON:  09/13/2013  FINDINGS: No PICC line is visualized within the chest or shoulder regions bilaterally.  There is cardiomegaly. Prior median sternotomy and valve replacement. Mild vascular congestion. Left lower lobe airspace opacity again noted, unchanged. No visible effusions.  IMPRESSION: No visible PICC line.  Stable cardiomegaly and vascular congestion.  Stable left lower lobe atelectasis or consolidation/pneumonia.   Electronically Signed   By: Rolm Baptise M.D.   On: 09/17/2013 04:08   Dg Chest Port 1 View  09/13/2013   CLINICAL DATA:  Respiratory distress  EXAM: PORTABLE CHEST - 1 VIEW  COMPARISON:  DG CHEST 1V PORT dated 09/08/2013  FINDINGS: Evidence of CABG reidentified with moderate enlargement of the cardiac silhouette and central vascular congestion. Retrocardiac opacity persists. No overt alveolar edema or other filling processes otherwise identified. Trace if any left pleural fluid. No acute osseous finding.  IMPRESSION: Cardiomegaly with central vascular congestion and persistent retrocardiac atelectasis  or possibly early pneumonia.   Electronically Signed   By: Conchita Paris M.D.   On: 09/13/2013 13:06   Dg Chest Port 1 View  09/08/2013   CLINICAL DATA:  Chest pain, hypertension  EXAM: PORTABLE CHEST - 1 VIEW  COMPARISON:  September 06, 2013  FINDINGS: The heart size and mediastinal contours are stable. Patient is status post prior CABG. The heart size is enlarged. Patchy consolidation of left lung base is identified. There is mild atelectasis of right lung base. There is no pulmonary edema. There is probable small left pleural effusion. The visualized skeletal structures are stable.  IMPRESSION: Early pneumonia of left lung base with small left pleural effusion.   Electronically Signed   By: Abelardo Diesel M.D.   On: 09/08/2013 23:41   Dg Abd Portable 2v  09/18/2013   CLINICAL DATA:  58 year old male with abdominal distension. Possible small bowel obstruction. Initial encounter.  EXAM: PORTABLE ABDOMEN - 2 VIEW  COMPARISON:  09/17/2013 and earlier.  FINDINGS: Supine and portable cross-table lateral views of the abdomen. Decreased gaseous distension of small bowel. Numerous gas-filled mostly nondilated loops persist. Persistent right colon gas. No pneumoperitoneum identified. Mild motion artifact on both views. Stable visualized osseous structures.  IMPRESSION: Improved bowel gas pattern with decreased gaseous distension of small bowel and persistent colon gas. Pattern could reflect improving small bowel obstruction or ileus.  No free air identified.   Electronically Signed   By: Lars Pinks M.D.   On: 09/18/2013 17:50         Subjective: Patient continues to have intermittent episodes of anxiety. Denies any fevers, chills, chest discomfort, shortness breath, nausea, vomiting. He continues to have loose stools. He has intermittent abdominal cramping.  Objective: Filed Vitals:   10/01/13 1820 10/01/13 2123 10/02/13 0430 10/02/13 1047  BP: 121/78 125/82 107/81 142/88  Pulse: 96 101 98 90  Temp: 99.8  F (37.7 C) 99.5 F (37.5 C) 98.4 F (36.9 C) 98 F (36.7 C)  TempSrc: Oral Oral Oral Oral  Resp: 22 22 20 18   Height:      Weight:   95.3 kg (210 lb 1.6 oz)   SpO2: 99% 90% 99% 99%    Intake/Output Summary (Last 24 hours) at 10/02/13 1711 Last data filed at 10/02/13 0745  Gross per 24 hour  Intake    120 ml  Output      2 ml  Net  118 ml   Weight change: -3.8 kg (-8 lb 6 oz) Exam:   General:  Pt is alert, follows commands appropriately, not in acute distress  HEENT: No icterus, No thrush,  [place]/AT  Cardiovascular: RRR, S1/S2, no rubs, no gallops  Respiratory: Bibasilar crackles. No wheezing. Good air movement.  Abdomen: Soft/+BS, mild tender diffusely without guarding or peritoneal sign, non distended, no guarding  Extremities: trace LE edema, No lymphangitis, No petechiae, No rashes, no synovitis  Data Reviewed: Basic Metabolic Panel:  Recent Labs Lab 09/28/13 0700 09/29/13 0410 09/29/13 0814 09/30/13 0443 10/01/13 0540  NA 139 133* 136* 137 137  K 4.0 3.5* 3.3* 3.8 4.0  CL 99 93* 95* 97 96  CO2 24 23 24 24 25   GLUCOSE 103* 139* 121* 91 106*  BUN 23 30* 32* 14 21  CREATININE 6.92* 8.51* 8.69* 5.19* 7.21*  CALCIUM 8.4 8.4 8.2* 8.2* 8.2*  PHOS  --   --  7.8*  --   --    Liver Function Tests:  Recent Labs Lab 09/29/13 0814  ALBUMIN 1.4*   No results found for this basename: LIPASE, AMYLASE,  in the last 168 hours No results found for this basename: AMMONIA,  in the last 168 hours CBC:  Recent Labs Lab 09/26/13 0404 09/27/13 0505 09/28/13 0700 09/29/13 0410 09/30/13 0443  WBC 16.8* 16.1* 18.3* 16.4* 14.2*  HGB 8.1* 8.9* 8.5* 8.6* 7.9*  HCT 23.2* 25.6* 24.5* 24.9* 22.7*  MCV 83.2 83.7 83.9 84.7 86.6  PLT 272 274 310 317 321   Cardiac Enzymes: No results found for this basename: CKTOTAL, CKMB, CKMBINDEX, TROPONINI,  in the last 168 hours BNP: No components found with this basename: POCBNP,  CBG:  Recent Labs Lab 10/01/13 1736  10/01/13 1816 10/02/13 0801 10/02/13 1219 10/02/13 1627  GLUCAP 146* 132* 105* 102* 105*    Recent Results (from the past 240 hour(s))  CULTURE, BLOOD (ROUTINE X 2)     Status: None   Collection Time    09/24/13 12:44 AM      Result Value Ref Range Status   Specimen Description BLOOD RIGHT ARM   Final   Special Requests BOTTLES DRAWN AEROBIC ONLY 6CC   Final   Culture  Setup Time     Final   Value: 09/24/2013 03:21     Performed at [place]     Final   Value: NO GROWTH 5 DAYS     Performed at [facility]   Report Status 09/30/2013 FINAL   Final  CULTURE, BLOOD (ROUTINE X 2)     Status: None   Collection Time    09/24/13 12:50 AM      Result Value Ref Range Status   Specimen Description BLOOD LEFT HAND   Final   Special Requests BOTTLES DRAWN AEROBIC ONLY 4CC   Final   Culture  Setup Time     Final   Value: 09/24/2013 03:21     Performed at [place]     Final   Value: NO GROWTH 5 DAYS     Performed at [facility]   Report Status 09/30/2013 FINAL   Final  CLOSTRIDIUM DIFFICILE BY PCR     Status: None   Collection Time    09/27/13  5:07 PM      Result Value Ref Range Status   C difficile by pcr NEGATIVE  NEGATIVE Final     Scheduled Meds: . antiseptic  oral rinse  15 mL Mouth Rinse q12n4p  . aspirin EC  81 mg Oral Daily  . chlorhexidine  15 mL Mouth Rinse BID  . [START ON 10/03/2013] darbepoetin (ARANESP) injection - DIALYSIS  60 mcg Intravenous Q Fri-HD  . feeding supplement (RESOURCE BREEZE)  1 Container Oral BID BM  . ferric gluconate (FERRLECIT/NULECIT) IV  125 mg Intravenous Weekly  . heparin subcutaneous  5,000 Units Subcutaneous 3 times per day  . levothyroxine  200 mcg Oral QAC breakfast  . multivitamin  1 tablet Oral QHS  . nafcillin IV  2 g Intravenous Q4H  . pantoprazole  40 mg Oral Q1200  . rifampin  600 mg Oral Daily  . sevelamer carbonate  1.6 g Oral TID WC  . sodium chloride  3 mL  Intravenous Q12H   Continuous Infusions:    Kenner Lewan, DO  [facility] Pager [phone]  If 7PM-7AM, please contact night-coverage [email or web address] Password [email or web address] 10/02/2013, 5:11 PM   LOS: 24 days

## 2013-10-03 MED ORDER — NEPRO/CARBSTEADY PO LIQD: 237.0000 mL | ORAL | Status: DC | PRN

## 2013-10-03 MED ORDER — LIDOCAINE HCL (PF) 1 % IJ SOLN: 5.0000 mL | INTRAMUSCULAR | Status: DC | PRN

## 2013-10-03 MED ORDER — HEPARIN SODIUM (PORCINE) 1000 UNIT/ML DIALYSIS: 20.0000 [IU]/kg | INTRAMUSCULAR | Status: DC | PRN

## 2013-10-03 MED ORDER — HEPARIN SODIUM (PORCINE) 1000 UNIT/ML DIALYSIS: 1000.0000 [IU] | INTRAMUSCULAR | Status: DC | PRN

## 2013-10-03 MED ORDER — PENTAFLUOROPROP-TETRAFLUOROETH EX AERO: 1.0000 "application " | INHALATION_SPRAY | CUTANEOUS | Status: DC | PRN

## 2013-10-03 MED ORDER — LORAZEPAM 0.5 MG PO TABS
ORAL_TABLET | ORAL | Status: AC
  Filled 2013-10-03: qty 1

## 2013-10-03 MED ORDER — DARBEPOETIN ALFA-POLYSORBATE 60 MCG/0.3ML IJ SOLN
INTRAMUSCULAR | Status: AC
  Administered 2013-10-03: 60 ug via INTRAVENOUS
  Filled 2013-10-03: qty 0.3

## 2013-10-03 MED ORDER — ALTEPLASE 2 MG IJ SOLR: 2.0000 mg | Freq: Once | INTRAMUSCULAR | Status: AC | PRN

## 2013-10-03 MED ORDER — SODIUM CHLORIDE 0.9 % IV SOLN: 100.0000 mL | INTRAVENOUS | Status: DC | PRN

## 2013-10-03 MED ORDER — LIDOCAINE-PRILOCAINE 2.5-2.5 % EX CREA: 1.0000 "application " | TOPICAL_CREAM | CUTANEOUS | Status: DC | PRN

## 2013-10-03 NOTE — Progress Notes (Signed)
PT Cancellation Note  Patient Details Name: Bradley [name] MRN: [id number] DOB: 05-19-1956   Cancelled Treatment:    Reason Eval/Treat Not Completed: Patient at procedure or test/unavailable; currently in hemodialysis.  Will check back as time allows.   Abednego Yeates,[name] 10/03/2013, 11:27 AM

## 2013-10-03 NOTE — Progress Notes (Signed)
Occupational Therapy Treatment Patient Details Name: Bradley [name] MRN: [id number] DOB: 02-18-1956 Today's Date: 10/03/2013 Time: [id number] OT Time Calculation (min): 33 min  OT Assessment / Plan / Recommendation  History of present illness Pt admit with sepsis and r/o endocarditis.  Anoxic brain injury.   OT comments  Pt making progress with functional goals and was able to complete ADL tasks with decreased cues and processing time. Pt demos increase functional mobility, but requires assist for safety/balance during sit - stand/standing tasks. Pt continues to demo difficulty with direction/sequeincing of tasks. Pt fatigued easily today (had hemodialysis this a.m.) Pt to continue with acute OT services to increase level of function and safety  Follow Up Recommendations  CIR;Supervision/Assistance - 24 hour    Barriers to Discharge   none    Equipment Recommendations  Other (comment) (TBD at next venue of care)    Recommendations for Other Services    Frequency Min 2X/week   Progress towards OT Goals Progress towards OT goals: Progressing toward goals  Plan Discharge plan remains appropriate    Precautions / Restrictions Precautions Precautions: Fall Restrictions Weight Bearing Restrictions: No   Pertinent Vitals/Pain No c/o pain    ADL  Grooming: Performed;Brushing hair;Set up Where Assessed - Grooming: Unsupported sitting Lower Body Bathing: Performed;Minimal assistance Upper Body Dressing: Performed;Minimal assistance;Other (comment) (verbal and physical cues for direction/sequencing) Lower Body Dressing: Performed;Other (comment);Min guard (with 1 verbal command) Toilet Transfer: Performed;Min guard Armed forces technical officer Method: Sit to Loss adjuster, chartered: Bedside commode Toileting - Clothing Manipulation and Hygiene: Performed;Min guard Where Assessed - Best boy and Hygiene: Standing Equipment Used: Rolling walker;Other (comment)  (BSC) Transfers/Ambulation Related to ADLs: verbal and physical cues for hand placement, direction/sequencing using RW    OT Diagnosis:    OT Problem List:   OT Treatment Interventions:     OT Goals(current goals can now be found in the care plan section)    Visit Information  Last OT Received On: 10/03/13 History of Present Illness: Pt admit with sepsis and r/o endocarditis.  Anoxic brain injury.    Subjective Data      Prior Functioning       Cognition  Cognition Arousal/Alertness: Awake/alert Behavior During Therapy: Flat affect Overall Cognitive Status: Impaired/Different from baseline Area of Impairment: Attention;Memory;Safety/judgement;Problem solving Orientation Level: Time;Situation Memory: Decreased short-term memory Safety/Judgement: Decreased awareness of safety Problem Solving: Slow processing;Decreased initiation;Difficulty sequencing;Requires verbal cues;Requires tactile cues    Mobility  Bed Mobility Overal bed mobility: Needs Assistance Supine to sit: Min guard;Supervision Sit to supine: Min guard;Supervision Transfers Overall transfer level: Independent Equipment used: Rolling walker (2 wheeled) Transfers: Sit to/from Stand Sit to Stand: Min guard General transfer comment: verbal and physical cues for hand placement, direction/sequencing using RW    Exercises  Other Exercises Other Exercises: Pt sat EOB x 10 minutes with 1step commands given, pt needed min guard A for ADLs. Pt able to maintain attention. Pt able to identify 2/3 toiletry itmes and correctly states their use. Pt required increased time Other Exercises: Pt asked 4 orientation questions and was able to answer 2/4 correctly. Pt required increased time   Balance Balance Overall balance assessment: Needs assistance Sitting-balance support: No upper extremity supported;Feet supported Sitting balance-Leahy Scale: Good Standing balance support: Single extremity supported;Bilateral upper  extremity supported;During functional activity Standing balance-Leahy Scale: Fair Standing balance comment: leans back  End of Session OT - End of Session Equipment Utilized During Treatment: Rolling walker;Other (comment) (BSC) Activity Tolerance: Patient tolerated treatment well;Patient  limited by fatigue Patient left: in bed;with call bell/phone within reach;with bed alarm set  GO     [place] 10/03/2013, 3:01 PM

## 2013-10-03 NOTE — Progress Notes (Addendum)
[facility] PROGRESS NOTE  Bradley [name] [id number] DOB: 22-Jan-1956 DOA: 09/08/2013 PCP: [name], MD  Interim summary 58 year old male with history of aortic stenosis, status post pediatric aortic valvulotomy at age 32 followed by tissue AVR (Bentall procedure) 02/2000 with cardiac cath 02/2010 with normal coronaries, chronic LBBB, thyroid cancer post thyroidectomy >> hypothyroidism, HTN, and depression, who had been generally unwell for 2-3 weeks prior to admission.Marland Kitchen He was seen in the ED on 09/06/13 and assessed as influenza related vomiting, diarrhea, abdominal pain, body aches, fever and mild confusion and was ultimately discharged on Tamiflu. Patient worsened at home with progressive confusion/disorientation and was admitted with sepsis, septic brain emboli and presumptive infective endocarditis. The patient developed worsening renal failure requiring hemodialysis. He has been since declared ESRD. A VasCath was converted to a PermCath on 09/25/13 by IR.  Vein mapping was performed on 10/02/2013. Plans are underway for AVF during week of 10/06/13. Once surgery is performed for aVF, it is hopeful that the patient can be transferred to CIR.  In the interim, the patient's mental status has gradually improved and his repeat blood cultures are negative. The patient will continue on intravenous antibiotics with nafcillin (and oral rifampin) through 10/21/13.  Assessment/Plan: Severe sepsis secondary to MSSA bacteremia w/ sepsis / infective endocarditis w/ cerebral septic emboli / UTI  -09/08/13 Blood cultures x 2 MSSA - follow up cultures negative  -09/08/13 UA cx with MSSA UTI  -TTE (09/09/13) without vegetation  -Cardiology/ID recommend 6 weeks of antibiotics for presumed endocarditis through October 21, 2013, no TEE at this time  -has prior tissue AVR  -ID directing antibiotics, currently on naficillin and rifampin  -Patient continues to have low grade fever, however, leukocytosis trending  downward  -Cdiff PCR negative on 2/16 and 09/16/13 And 09/27/13  -Blood cultures from 09/10/13, 09/24/13, show no growth to date  Toxic metabolic encephalopathy  -gradually improving  -secondary to severe sepsis, azotemia, and septic brain emboli  -MRI Brain (09/09/13) with multiple bilateral supra tentorial and right cerebellar regions consistent with acute partially hemorrhagic infarcts suspected secondary to septic emboli  -Continue supportive care and treat underlying causes  -Neurology has evaluated  -QTc prolonged so haldol not safe  -[id number], folic acid [id number], and ammonia 17  Severe thrombocytopenia  -improved with tx of infectious process  -Resolved, Platelet counts dropped to a nadir of 23  -felt to be due to sepsis  Acute Renal Failure/Acute tubular necrosis-->ESRD  -due to bacteremia/immune complex or gentamicin  -baseline renal fnx: 18 / 0.93  -progressive failure since admit  -Transferred to [name] 09/15/2013 for hemodialysis  -Hemodialysis started 09/17/2013  -Renal US normal kidneys, no hydronephrosis  -appreciate renal followup  -AVF/graft planned 10/03/13  Abdominal distention/diarrhea  -distention improved  -diarrhea likely due to antibiotics, which has resolved  -C diff negative 2/16 and 2/17, 2/28  NSTEMI (Type II)  -peak Tropinin 2.26  -Cardiology has no plans for cath at this point  -has stable ST elevation r/t LBBB  Acute Systolic HF and Chronic grade 1 Diastolic CHF  -ECHO Oct 123456 with EF 60-65% with severe LVH and no RWMA  -ECHO Sep 09, 2013 EF 45-50% with septal and apical hypokinesis  History of thyroid cancer, post thyroidectomy hypothyroidism  -TSH elevated at 10.174  -Synthroid increased  -Will need monitoring and repeat labs in 4-6 weeks  Hypertension  -Holding lisinopril due to acute renal failure  -Continue metoprolol IV PRN  Hyperglycemia  -Hemoglobin A1c 5.7  -d/c CBG  monitoring and ISS  History of depression  -hold Zoloft secondary to  acute encephalopathy.  Anxiety/Insomnia  -Will discontinue ambien  -Continue Klonopin QHS PRN, and Ativan daily PRN  -Continue to monitor  Hyponatremia  -Resolved, due to volume overload  -corrected after initiation of hemodialysis  Deconditioning  -CIR reconsulted, and pending evaluation  Code Status: Full  Family Communication: wife and son updated on 10/01/13  Disposition Plan: SNF vs CIR  Procedures/Course  2/09 - admit  2/10 - ID, Heme and Cards consults. TTE EF 45%. No clear vegetations  2/14 - PCCM consult  2/15 - Junctional rhythm with precedex, mental status improved. Transferred to [facility] for [facility] HD  2/16 - Renal U/S- normal kidneys, no hydronephrosis  2/18 - transferred to ICU for diaphoresis, ST changes and confusion  2/21 - patient pulled out L IJ HD catheter that was placed on 09/17/13  2/21 - L [place] HD catheter placed  2/26 - Conversion of R IJ temp cath to [facility] cath  [place]  Nephrology  PCCM  Infectious disease  Interventional radiology  Cardiology  Neurology  Hematology  CIR  DVT Prophylaxis Heparin           Procedures/Studies: Dg Chest 2 View  09/06/2013   CLINICAL DATA:  Fever, nausea, vomiting.  EXAM: CHEST  2 VIEW  COMPARISON:  08/17/2011  FINDINGS: Prior median sternotomy and valve replacement. Mild cardiomegaly and vascular congestion. Low lung volumes with bibasilar atelectasis. No effusions. No acute bony abnormality.  IMPRESSION: Cardiomegaly, bibasilar atelectasis.   Electronically Signed   By: Rolm Baptise M.D.   On: 09/06/2013 19:34   Dg Abd 1 View  09/17/2013   CLINICAL DATA:  Abdominal distension  EXAM: ABDOMEN - 1 VIEW  COMPARISON:  CT ABD/PELVIS W CM dated 09/06/2013  FINDINGS: There is at least moderate gas distention of multiple loops of predominantly small bowel, though there may be minimal gaseous distention of the splenic flexure of the colon.  Examination is degraded secondary to the coned field-of-view on solitary provided  radiograph.  Nondiagnostic evaluation for pneumoperitoneum secondary supine positioning and exclusion of the lower thorax. No definite pneumatosis or portal venous gas.  No definite abnormal intra-abdominal calcifications.  Degenerative change the lower lumbar spine.  IMPRESSION: Degraded examination with findings concerning for small bowel obstruction though there may be a minimal amount of air within the splenic flexure of the colon. Clinical correlation is advised. Further evaluation with complete abdominal radiographic series may be performed as clinically indicated.   Electronically Signed   By: Sandi Mariscal M.D.   On: 09/17/2013 10:38   Ct Head Wo Contrast  09/12/2013   CLINICAL DATA:  Increased mental status change, evaluate for CVA versus worsening bleed.  EXAM: CT HEAD WITHOUT CONTRAST  TECHNIQUE: Contiguous axial images were obtained from the base of the skull through the vertex without intravenous contrast.  COMPARISON:  Prior MRI and and CT from 09/09/2013.  FINDINGS: Study is degraded by motion artifact.  Vasogenic edema as trending linear hyperdensity within the left parietal lobe is stable not significantly changed relative to the previous examination. Vague hypodensities involving the supratentorial brain and right cerebellar hemisphere are not significantly changed relative to recent MRI. No definite a new intracranial infarct identified. Previously seen hyperdense foci within the mid bilateral frontal lobes are less conspicuous as compared to prior. No new intracranial hemorrhage. No mass lesion or midline shift. Ventricles are stable in size without evidence of hydrocephalus. No extra-axial fluid collection.  The  calvarium is grossly intact. Orbits are normal. Paranasal sinuses and mastoid air cells remain clear.  IMPRESSION: 1. Limited study due to motion. No significant interval change in subacute left parietal infarct with small amount of associated hemorrhage. Additional multi focal infarcts  involving the supratentorial brain and right cerebellar hemisphere are not significantly changed as well relative to recent MRI from 09/09/2013. No definite new intracranial infarct identified. No new intracranial hemorrhage.   Electronically Signed   By: Jeannine Boga M.D.   On: 09/12/2013 22:02   Ct Head Wo Contrast  09/09/2013   CLINICAL DATA:  Fever.  Altered mental status.  EXAM: CT HEAD WITHOUT CONTRAST  TECHNIQUE: Contiguous axial images were obtained from the base of the skull through the vertex without intravenous contrast.  COMPARISON:  CT HEAD W/O CM dated 09/06/2013; DG CHEST 1V PORT dated 09/08/2013; CT ABD/PELVIS W CM dated 09/06/2013  FINDINGS: Small focus of linear high attenuation in the left parietal lobe appear similar to the prior exam. The amount of edema at has increased compared to the prior exam suggesting a subacute process. This appears wedge-shaped in may represent an embolic infarct. There is a similar faint area of high attenuation in the left frontal lobe on image number 22 low with a more subtle area in the right frontal lobe that is more equivocal. The dural venous sinuses appear within normal limits. No mass lesion or midline shift. No hydrocephalus.  The calvarium is intact. Left-sided nasal septal spur. Mastoid air cells clear. Paranasal sinuses are normal.  IMPRESSION: 1. Increasing edema around linear high attenuation in the left parietal lobe. Followup MRI with and without contrast is recommended for further assessment. The patient presents with an infectious syndrome and septic emboli are in the differential considerations. Infarct with a small amount of hemorrhage or cortical laminar necrosis as well as developmental venous anomaly remain in the differential considerations. MRI should be useful in further characterization. 2. Dr. Leonel Ramsay called discuss the case at 0230 hr on the day of examination.   Electronically Signed   By: Dereck Ligas M.D.   On: 09/09/2013  02:32   Ct Head Wo Contrast  09/06/2013   CLINICAL DATA:  Confusion.  EXAM: CT HEAD WITHOUT CONTRAST  TECHNIQUE: Contiguous axial images were obtained from the base of the skull through the vertex without intravenous contrast.  COMPARISON:  None.  FINDINGS: There is a 1.3 cm curvilinear focus of hyperdensity in the subcortical left parietal lobe (image 25). There is no evidence of surrounding edema. There is no other evidence of acute intracranial hemorrhage. There is no evidence of acute cortical infarct, mass, midline shift, or extra-axial fluid collection. Subcentimeter low density focus in the left caudate is suggestive of a remote lacunar infarct. Ventricles and sulci are normal. There is very mild mucosal thickening in the frontal sinuses and ethmoid air cells bilaterally. Mastoid air cells are clear. Orbits are unremarkable.  IMPRESSION: 1. Curvilinear hyperdensity in the subcortical left parietal lobe. Acute intracranial hemorrhage cannot be completely excluded, however there is no surrounding edema or evidence of hemorrhage elsewhere in the brain and the patient is not presenting in the setting of acute trauma. Given the curvilinear configuration, this may be vascular and represent an incidental developmental venous anomaly. 2. Remote left caudate lacunar infarct.   Electronically Signed   By: Logan Bores   On: 09/06/2013 22:29   Mr Brain W Wo Contrast  09/09/2013   CLINICAL DATA:  Post aortic valve replacement 2011. Presenting  with altered mental status, elevated white count and abnormal CT.  EXAM: MRI HEAD WITHOUT AND WITH CONTRAST  TECHNIQUE: Multiplanar, multiecho pulse sequences of the brain and surrounding structures were obtained without and with intravenous contrast.  CONTRAST:  58mL MULTIHANCE GADOBENATE DIMEGLUMINE 529 MG/ML IV SOLN  COMPARISON:  09/09/2013 and 09/06/2013 CT.  No comparison MR.  FINDINGS: Exam is motion degraded.  Multiple bilateral supra tentorial and right cerebellar  regions of restricted motion some of which are partially hemorrhagic with largest area left parietal lobe. In the present clinical setting, these findings are consistent with acute partially hemorrhagic infarcts and possibly related to septic emboli.  At most there is very minimal enhancement of the left parietal partially hemorrhagic infarct.  No intracranial mass lesion seen separate from the above described findings.  Major intracranial vascular structures are patent.  IMPRESSION: Exam is motion degraded.  Multiple bilateral supra tentorial and right cerebellar regions of restricted motion some of which are partially hemorrhagic with largest area left parietal lobe. In the present clinical setting, these findings are consistent with acute partially hemorrhagic infarcts and possibly related to septic emboli.  These results were called by telephone at the time of interpretation on 09/09/2013 at 11:54 AM to Dr. Algis Liming , who verbally acknowledged these results.   Electronically Signed   By: Chauncey Cruel M.D.   On: 09/09/2013 12:03   Ct Abdomen Pelvis W Contrast  09/06/2013   CLINICAL DATA:  58 year old male abdominal pain, vomiting, diarrhea and low grade fever.  EXAM: CT ABDOMEN AND PELVIS WITH CONTRAST  TECHNIQUE: Multidetector CT imaging of the abdomen and pelvis was performed using the standard protocol following bolus administration of intravenous contrast.  CONTRAST:  100 cc intravenous Omnipaque 300  COMPARISON:  None.  FINDINGS: Cardiomegaly and aortic valve replacement noted. Mild bibasilar atelectasis/scarring is identified.  The liver, spleen, pancreas, gallbladder adrenal glands and kidneys are unremarkable except for small bilateral renal cysts.  There is no evidence of free fluid, enlarged lymph nodes, biliary dilation or abdominal aortic aneurysm.  The bowel, bladder and appendix are unremarkable. There is no evidence of bowel obstruction, abscess or pneumoperitoneum.  A small right inguinal hernia  containing fat is identified.  Mild prostate enlargement is present.  No acute or suspicious bony abnormalities are identified.  IMPRESSION: No evidence of acute abnormality.  Cardiomegaly, aortic valve replacement, prostate enlargement and small right inguinal hernias containing fat.   Electronically Signed   By: Hassan Rowan M.D.   On: 09/06/2013 22:32   Ir Veno/ext/uni Right  09/14/2013   CLINICAL DATA:  Poor venous access, request for PICC  EXAM: DUAL LUMEN LEFT MIDLINE VENOUS CATHETER PLACEMENT WITH ULTRASOUND AND FLUOROSCOPIC GUIDANCE  ADDITIONAL VENOGRAPHY OF THE RIGHT UPPER EXTREMITY  FLUOROSCOPY TIME:  7 MIN AND 30 SECONDS.  PROCEDURE: The patient's family member was advised of the possible risks and complications and agreed to undergo the procedure. The patient was then brought to the angiographic suite for the procedure.  The right and left arm was prepped with chlorhexidine, draped in the usual sterile fashion using maximum barrier technique (cap and mask, sterile gown, sterile gloves, large sterile sheet, hand hygiene and cutaneous antisepsis) and infiltrated locally with 1% Lidocaine.  Ultrasound demonstrated patency of the right and left brachial vein, and this was documented with an image. Under real-time ultrasound guidance, the right brachial vein was accessed with a 21 gauge micropuncture needle and image documentation was performed. A 0.018 wire was introduced in to the  vein. Over this, a dilator introducer was placed, the wire was unable to be advanced proximally so a venogram was performed with 10 cc of Omnipaque, this revealed venous extravasation. The right brachial vein site was abandoned and manual pressure was held until the site was soft with no continued bleeding. The left brachial vein was accessed with a 21 gauge micropuncture needle and multiple guidewires used to advance to the SVC/RA junction. A 5 French dual lumen power-injectable PICC was advanced, however unable to cross  proximally to the lower SVC/right atrial junction, the PICC was left midline for this reason. Fluoroscopy during the procedure and fluoro spot radiograph confirms appropriate catheter position. The catheter aspirated and flushed well and was then covered with a sterile dressing.  Complications: Extravasation of 10 cc of Omnipaque to right brachial vein. Unable to advance left brachial vein PICC to the SVC/RA junction, left brachial PICC was placed midline.  IMPRESSION: Successful left arm midline VENOUS CATHETER placement with ultrasound and fluoroscopic guidance. The catheter is ready for use.  Read By:  Tsosie Billing PA-C   Electronically Signed   By: Aletta Edouard M.D.   On: 09/14/2013 11:51   US Renal  09/15/2013   CLINICAL DATA:  Acute renal failure.  EXAM: RENAL/URINARY TRACT ULTRASOUND COMPLETE  COMPARISON:  CT abdomen and pelvis 09/06/2013.  FINDINGS: Right Kidney:  Length: Approximately 12.7 cm. No hydronephrosis. Well-preserved cortex. No shadowing calculi. Normal parenchymal echotexture without focal abnormalities.  Left Kidney:  Length: Approximately 12.5 cm. No hydronephrosis. Well-preserved cortex. No shadowing calculi. Normal parenchymal echotexture without focal abnormalities.  Bladder:  Decompressed by Foley catheter.  IMPRESSION: Normal kidneys.  Specifically, no evidence of hydronephrosis.   Electronically Signed   By: Evangeline Dakin M.D.   On: 09/15/2013 10:55   Ir Fluoro Guide Cv Line Left  09/14/2013   CLINICAL DATA:  Poor venous access, request for PICC  EXAM: DUAL LUMEN LEFT MIDLINE VENOUS CATHETER PLACEMENT WITH ULTRASOUND AND FLUOROSCOPIC GUIDANCE  ADDITIONAL VENOGRAPHY OF THE RIGHT UPPER EXTREMITY  FLUOROSCOPY TIME:  7 MIN AND 30 SECONDS.  PROCEDURE: The patient's family member was advised of the possible risks and complications and agreed to undergo the procedure. The patient was then brought to the angiographic suite for the procedure.  The right and left arm was prepped with  chlorhexidine, draped in the usual sterile fashion using maximum barrier technique (cap and mask, sterile gown, sterile gloves, large sterile sheet, hand hygiene and cutaneous antisepsis) and infiltrated locally with 1% Lidocaine.  Ultrasound demonstrated patency of the right and left brachial vein, and this was documented with an image. Under real-time ultrasound guidance, the right brachial vein was accessed with a 21 gauge micropuncture needle and image documentation was performed. A 0.018 wire was introduced in to the vein. Over this, a dilator introducer was placed, the wire was unable to be advanced proximally so a venogram was performed with 10 cc of Omnipaque, this revealed venous extravasation. The right brachial vein site was abandoned and manual pressure was held until the site was soft with no continued bleeding. The left brachial vein was accessed with a 21 gauge micropuncture needle and multiple guidewires used to advance to the SVC/RA junction. A 5 French dual lumen power-injectable PICC was advanced, however unable to cross proximally to the lower SVC/right atrial junction, the PICC was left midline for this reason. Fluoroscopy during the procedure and fluoro spot radiograph confirms appropriate catheter position. The catheter aspirated and flushed well and was then covered  with a sterile dressing.  Complications: Extravasation of 10 cc of Omnipaque to right brachial vein. Unable to advance left brachial vein PICC to the SVC/RA junction, left brachial PICC was placed midline.  IMPRESSION: Successful left arm midline VENOUS CATHETER placement with ultrasound and fluoroscopic guidance. The catheter is ready for use.  Read By:  Tsosie Billing PA-C   Electronically Signed   By: Aletta Edouard M.D.   On: 09/14/2013 11:51   Ir Fluoro Guide Cv Line Right  09/25/2013   INDICATION: Acute renal failure. Temporary catheter needs conversion to a tunneled dialysis catheter.  EXAM: CONVERSION OF NON TUNNELED  CATHETER TO A TUNNELED DIALYSIS CATHETER WITH FLUOROSCOPIC GUIDANCE  Physician: Stephan Minister. Anselm Pancoast, MD  MEDICATIONS: 3 mg versed, 150 mcg fentanyl. The patient was already on scheduled antibiotics. A radiology nurse monitored the patient for moderate sedation.  ANESTHESIA/SEDATION: Moderate sedation time: 25 minutes  FLUOROSCOPY TIME:  24 seconds  PROCEDURE: Informed consent was obtained for conversion to a tunneled dialysis catheter. The existing right jugular catheter and right side of the neck was prepped and draped in sterile fashion. Maximal barrier sterile technique was utilized including caps, mask, sterile gowns, sterile gloves, sterile drape, hand hygiene and skin antiseptic. The skin below the right clavicle was anesthetized with 1% lidocaine. A subcutaneous tract was created and a 23 cm tip to cuff HemoSplit catheter was placed through the tract to the temporary dialysis catheter site. The temporary dialysis catheter was exchanged for a peel-away sheath over a wire. The new dialysis catheter was placed through the peel-away sheath and the tip was placed in the right atrium. Both lumens aspirated and flushed well. Appropriate amount of heparin was placed in both lumens. Gelfoam was placed within the subcutaneous tract. The vein skin site and catheter exit site were sutured with 2-0 Ethilon sutures.  COMPLICATIONS: None  FINDINGS: Catheter tip in the right atrium.  IMPRESSION: Successful conversion of a temporary dialysis catheter to a tunneled dialysis catheter with fluoroscopy.   Electronically Signed   By: Markus Daft M.D.   On: 09/25/2013 16:25   Ir Fluoro Guide Cv Line Right  09/21/2013   CLINICAL DATA:  Renal failure  EXAM: IR RIGHT FLOURO GUIDE CV LINE; IR ULTRASOUND GUIDANCE VASC ACCESS RIGHT  MEDICATIONS AND MEDICAL HISTORY: None  ANESTHESIA/SEDATION: None  CONTRAST:  None  FLUOROSCOPY TIME:  18 seconds.  PROCEDURE: The procedure, risks, benefits, and alternatives were explained to the patient.  Questions regarding the procedure were encouraged and answered. The patient understands and consents to the procedure.  The right neck was prepped with Betadine in a sterile fashion, and a sterile drape was applied covering the operative field. A sterile gown and sterile gloves were used for the procedure.  Under sonographic guidance, a micropuncture needle was inserted into the right internal jugular vein and removed over a 018 wire which was up sized to a 3 J. the 20 cm temporary dialysis catheter was advanced over the wire to the right atrium. It was flushed and sewn in place.  COMPLICATIONS: None  FINDINGS: Tip of the temporary dialysis catheter is at the right atrium  IMPRESSION: Successful placement of a temporary right internal jugular vein dialysis catheter.   Electronically Signed   By: Maryclare Bean M.D.   On: 09/21/2013 12:15   Ir US Guide Vasc Access Left  09/14/2013   CLINICAL DATA:  Poor venous access, request for PICC  EXAM: DUAL LUMEN LEFT MIDLINE VENOUS CATHETER PLACEMENT WITH ULTRASOUND AND  FLUOROSCOPIC GUIDANCE  ADDITIONAL VENOGRAPHY OF THE RIGHT UPPER EXTREMITY  FLUOROSCOPY TIME:  7 MIN AND 30 SECONDS.  PROCEDURE: The patient's family member was advised of the possible risks and complications and agreed to undergo the procedure. The patient was then brought to the angiographic suite for the procedure.  The right and left arm was prepped with chlorhexidine, draped in the usual sterile fashion using maximum barrier technique (cap and mask, sterile gown, sterile gloves, large sterile sheet, hand hygiene and cutaneous antisepsis) and infiltrated locally with 1% Lidocaine.  Ultrasound demonstrated patency of the right and left brachial vein, and this was documented with an image. Under real-time ultrasound guidance, the right brachial vein was accessed with a 21 gauge micropuncture needle and image documentation was performed. A 0.018 wire was introduced in to the vein. Over this, a dilator introducer  was placed, the wire was unable to be advanced proximally so a venogram was performed with 10 cc of Omnipaque, this revealed venous extravasation. The right brachial vein site was abandoned and manual pressure was held until the site was soft with no continued bleeding. The left brachial vein was accessed with a 21 gauge micropuncture needle and multiple guidewires used to advance to the SVC/RA junction. A 5 French dual lumen power-injectable PICC was advanced, however unable to cross proximally to the lower SVC/right atrial junction, the PICC was left midline for this reason. Fluoroscopy during the procedure and fluoro spot radiograph confirms appropriate catheter position. The catheter aspirated and flushed well and was then covered with a sterile dressing.  Complications: Extravasation of 10 cc of Omnipaque to right brachial vein. Unable to advance left brachial vein PICC to the SVC/RA junction, left brachial PICC was placed midline.  IMPRESSION: Successful left arm midline VENOUS CATHETER placement with ultrasound and fluoroscopic guidance. The catheter is ready for use.  Read By:  Tsosie Billing PA-C   Electronically Signed   By: Aletta Edouard M.D.   On: 09/14/2013 11:51   Ir US Guide Vasc Access Right  09/21/2013   CLINICAL DATA:  Renal failure  EXAM: IR RIGHT FLOURO GUIDE CV LINE; IR ULTRASOUND GUIDANCE VASC ACCESS RIGHT  MEDICATIONS AND MEDICAL HISTORY: None  ANESTHESIA/SEDATION: None  CONTRAST:  None  FLUOROSCOPY TIME:  18 seconds.  PROCEDURE: The procedure, risks, benefits, and alternatives were explained to the patient. Questions regarding the procedure were encouraged and answered. The patient understands and consents to the procedure.  The right neck was prepped with Betadine in a sterile fashion, and a sterile drape was applied covering the operative field. A sterile gown and sterile gloves were used for the procedure.  Under sonographic guidance, a micropuncture needle was inserted into the right  internal jugular vein and removed over a 018 wire which was up sized to a 3 J. the 20 cm temporary dialysis catheter was advanced over the wire to the right atrium. It was flushed and sewn in place.  COMPLICATIONS: None  FINDINGS: Tip of the temporary dialysis catheter is at the right atrium  IMPRESSION: Successful placement of a temporary right internal jugular vein dialysis catheter.   Electronically Signed   By: Maryclare Bean M.D.   On: 09/21/2013 12:15   Dg Chest Port 1 View  09/18/2013   CLINICAL DATA:  Respiratory distress, shortness of breath.  EXAM: PORTABLE CHEST - 1 VIEW  COMPARISON:  09/17/2013  FINDINGS: Prior CABG and valve replacement. Left pass catheterization is in stable position. Cardiomegaly. Left lower lobe opacity, likely atelectasis. No  effusions or overt edema.  IMPRESSION: Cardiomegaly.  Left lower lobe atelectasis.   Electronically Signed   By: Rolm Baptise M.D.   On: 09/18/2013 06:27   Dg Chest Port 1 View  09/17/2013   CLINICAL DATA:  Hemodialysis catheter placement  EXAM: PORTABLE CHEST - 1 VIEW  COMPARISON:  DG CHEST 1V PORT dated 09/17/2013; DG CHEST 1V PORT dated 09/13/2013; DG CHEST 1V PORT dated 09/08/2013  FINDINGS: Grossly unchanged enlarged cardiac silhouette and mediastinal contours post median sternotomy and valve replacement. Interval placement of left jugular approach dialysis catheter with tip projected over the superior aspect of the SVC. Lung volumes remain reduced. Pulmonary vasculature remains indistinct. Grossly unchanged perihilar and bibasilar heterogeneous / consolidative opacities, left greater than right. No definite pleural effusion or pneumothorax. Grossly unchanged bones including postsurgical/traumatic deformity of the distal end of the right clavicle.  IMPRESSION: 1. Interval placement of left jugular approach central venous catheter with tip projected over the superior SVC. No pneumothorax. 2. Similar findings of pulmonary edema and perihilar/bibasilar  opacities, left greater than right, atelectasis versus infiltrate.   Electronically Signed   By: Sandi Mariscal M.D.   On: 09/17/2013 10:41   Dg Chest Port 1 View  09/17/2013   CLINICAL DATA:  Confirm PICC line placement.  EXAM: PORTABLE CHEST - 1 VIEW  COMPARISON:  09/13/2013  FINDINGS: No PICC line is visualized within the chest or shoulder regions bilaterally.  There is cardiomegaly. Prior median sternotomy and valve replacement. Mild vascular congestion. Left lower lobe airspace opacity again noted, unchanged. No visible effusions.  IMPRESSION: No visible PICC line.  Stable cardiomegaly and vascular congestion.  Stable left lower lobe atelectasis or consolidation/pneumonia.   Electronically Signed   By: Rolm Baptise M.D.   On: 09/17/2013 04:08   Dg Chest Port 1 View  09/13/2013   CLINICAL DATA:  Respiratory distress  EXAM: PORTABLE CHEST - 1 VIEW  COMPARISON:  DG CHEST 1V PORT dated 09/08/2013  FINDINGS: Evidence of CABG reidentified with moderate enlargement of the cardiac silhouette and central vascular congestion. Retrocardiac opacity persists. No overt alveolar edema or other filling processes otherwise identified. Trace if any left pleural fluid. No acute osseous finding.  IMPRESSION: Cardiomegaly with central vascular congestion and persistent retrocardiac atelectasis or possibly early pneumonia.   Electronically Signed   By: Conchita Paris M.D.   On: 09/13/2013 13:06   Dg Chest Port 1 View  09/08/2013   CLINICAL DATA:  Chest pain, hypertension  EXAM: PORTABLE CHEST - 1 VIEW  COMPARISON:  September 06, 2013  FINDINGS: The heart size and mediastinal contours are stable. Patient is status post prior CABG. The heart size is enlarged. Patchy consolidation of left lung base is identified. There is mild atelectasis of right lung base. There is no pulmonary edema. There is probable small left pleural effusion. The visualized skeletal structures are stable.  IMPRESSION: Early pneumonia of left lung base with  small left pleural effusion.   Electronically Signed   By: Abelardo Diesel M.D.   On: 09/08/2013 23:41   Dg Abd Portable 2v  09/18/2013   CLINICAL DATA:  58 year old male with abdominal distension. Possible small bowel obstruction. Initial encounter.  EXAM: PORTABLE ABDOMEN - 2 VIEW  COMPARISON:  09/17/2013 and earlier.  FINDINGS: Supine and portable cross-table lateral views of the abdomen. Decreased gaseous distension of small bowel. Numerous gas-filled mostly nondilated loops persist. Persistent right colon gas. No pneumoperitoneum identified. Mild motion artifact on both views. Stable visualized osseous structures.  IMPRESSION: Improved bowel gas pattern with decreased gaseous distension of small bowel and persistent colon gas. Pattern could reflect improving small bowel obstruction or ileus.  No free air identified.   Electronically Signed   By: Lars Pinks M.D.   On: 09/18/2013 17:50         Subjective: Patient complains of intermittent abdominal cramping in the suprapubic area. Denies any fevers, chills, chest discomfort, shortness of breath, nausea, vomiting. He continues to have 2-3 loose stools daily. No hematochezia.  Objective: Filed Vitals:   10/03/13 1000 10/03/13 1030 10/03/13 1100 10/03/13 1129  BP: 126/89 121/85 122/89 129/87  Pulse: 99 99 103 101  Temp:    98.6 F (37 C)  TempSrc:    Oral  Resp: 18 21 26 24   Height:      Weight:    91.6 kg (201 lb 15.1 oz)  SpO2: 100%   100%    Intake/Output Summary (Last 24 hours) at 10/03/13 1319 Last data filed at 10/03/13 1129  Gross per 24 hour  Intake    680 ml  Output   2496 ml  Net  -1816 ml   Weight change: -0.122 kg (-4.3 oz) Exam:   General:  Pt is alert, follows commands appropriately, not in acute distress  HEENT: No icterus, No thrush, [place]/AT  Cardiovascular: RRR, S1/S2, no rubs, no gallops  Respiratory: Bibasilar crackles, R>L, no wheeze. Good air movement  Abdomen: Soft/+BS, suprapubic tenderness without any  rebound tenderness, non distended, no guarding  Extremities: trace LE edema, No lymphangitis, No petechiae, No rashes, no synovitis  Data Reviewed: Basic Metabolic Panel:  Recent Labs Lab 09/28/13 0700 09/29/13 0410 09/29/13 0814 09/30/13 0443 10/01/13 0540  NA 139 133* 136* 137 137  K 4.0 3.5* 3.3* 3.8 4.0  CL 99 93* 95* 97 96  CO2 24 23 24 24 25   GLUCOSE 103* 139* 121* 91 106*  BUN 23 30* 32* 14 21  CREATININE 6.92* 8.51* 8.69* 5.19* 7.21*  CALCIUM 8.4 8.4 8.2* 8.2* 8.2*  PHOS  --   --  7.8*  --   --    Liver Function Tests:  Recent Labs Lab 09/29/13 0814  ALBUMIN 1.4*   No results found for this basename: LIPASE, AMYLASE,  in the last 168 hours No results found for this basename: AMMONIA,  in the last 168 hours CBC:  Recent Labs Lab 09/27/13 0505 09/28/13 0700 09/29/13 0410 09/30/13 0443  WBC 16.1* 18.3* 16.4* 14.2*  HGB 8.9* 8.5* 8.6* 7.9*  HCT 25.6* 24.5* 24.9* 22.7*  MCV 83.7 83.9 84.7 86.6  PLT 274 310 317 321   Cardiac Enzymes: No results found for this basename: CKTOTAL, CKMB, CKMBINDEX, TROPONINI,  in the last 168 hours BNP: No components found with this basename: POCBNP,  CBG:  Recent Labs Lab 10/01/13 1736 10/01/13 1816 10/02/13 0801 10/02/13 1219 10/02/13 1627  GLUCAP 146* 132* 105* 102* 105*    Recent Results (from the past 240 hour(s))  CULTURE, BLOOD (ROUTINE X 2)     Status: None   Collection Time    09/24/13 12:44 AM      Result Value Ref Range Status   Specimen Description BLOOD RIGHT ARM   Final   Special Requests BOTTLES DRAWN AEROBIC ONLY [facility]   Final   Culture  Setup Time     Final   Value: 09/24/2013 03:21     Performed at [facility]   Culture     Final   Value: NO  GROWTH 5 DAYS     Performed at [facility]   Report Status 09/30/2013 FINAL   Final  CULTURE, BLOOD (ROUTINE X 2)     Status: None   Collection Time    09/24/13 12:50 AM      Result Value Ref Range Status   Specimen Description BLOOD  LEFT HAND   Final   Special Requests BOTTLES DRAWN AEROBIC ONLY [facility]   Final   Culture  Setup Time     Final   Value: 09/24/2013 03:21     Performed at [facility]   Culture     Final   Value: NO GROWTH 5 DAYS     Performed at [facility]   Report Status 09/30/2013 FINAL   Final  CLOSTRIDIUM DIFFICILE BY PCR     Status: None   Collection Time    09/27/13  5:07 PM      Result Value Ref Range Status   C difficile by pcr NEGATIVE  NEGATIVE Final     Scheduled Meds: . antiseptic oral rinse  15 mL Mouth Rinse q12n4p  . aspirin EC  81 mg Oral Daily  . darbepoetin (ARANESP) injection - DIALYSIS  60 mcg Intravenous Q Fri-HD  . feeding supplement (RESOURCE BREEZE)  1 Container Oral BID BM  . ferric gluconate (FERRLECIT/NULECIT) IV  125 mg Intravenous Weekly  . heparin subcutaneous  5,000 Units Subcutaneous 3 times per day  . levothyroxine  200 mcg Oral QAC breakfast  . multivitamin  1 tablet Oral QHS  . nafcillin IV  2 g Intravenous Q4H  . pantoprazole  40 mg Oral Q1200  . rifampin  600 mg Oral Daily  . sevelamer carbonate  1.6 g Oral TID WC  . sodium chloride  3 mL Intravenous Q12H   Continuous Infusions:    Jeffry Vogelsang, DO  [facility] Pager [phone]  If 7PM-7AM, please contact night-coverage [email or web address] Password [email or web address] 10/03/2013, 1:19 PM   LOS: 25 days

## 2013-10-03 NOTE — Progress Notes (Signed)
[facility] for Infectious Disease  Date of Admission:  09/08/2013  Antibiotics: Total antibiotics 24 days  Subjective: No acute events  Objective: Temp:  [98.4 F (36.9 C)-98.8 F (37.1 C)] 98.6 F (37 C) (03/06 1129) Pulse Rate:  [88-105] 101 (03/06 1129) Resp:  [15-26] 24 (03/06 1129) BP: (116-135)/(76-91) 129/87 mmHg (03/06 1129) SpO2:  [95 %-100 %] 100 % (03/06 1129) Weight:  [201 lb 15.1 oz (91.6 kg)-207 lb 3.7 oz (94 kg)] 201 lb 15.1 oz (91.6 kg) (03/06 1129)  General: alert, nad, not confused Skin: no rashes Lungs: CTA B Cor: RRR Abdomen: soft, nt, nd Ext:no edema  Lab Results Lab Results  Component Value Date   WBC 14.2* 09/30/2013   HGB 7.9* 09/30/2013   HCT 22.7* 09/30/2013   MCV 86.6 09/30/2013   PLT 321 09/30/2013    Lab Results  Component Value Date   CREATININE 7.21* 10/01/2013   BUN 21 10/01/2013   NA 137 10/01/2013   K 4.0 10/01/2013   CL 96 10/01/2013   CO2 25 10/01/2013    Lab Results  Component Value Date   ALT 16 09/25/2013   AST 22 09/25/2013   ALKPHOS 50 09/25/2013   BILITOT 0.6 09/25/2013      Microbiology: Recent Results (from the past 240 hour(s))  CULTURE, BLOOD (ROUTINE X 2)     Status: None   Collection Time    09/24/13 12:44 AM      Result Value Ref Range Status   Specimen Description BLOOD RIGHT ARM   Final   Special Requests BOTTLES DRAWN AEROBIC ONLY [place]   Final   Culture  Setup Time     Final   Value: 09/24/2013 03:21     Performed at [facility]   Culture     Final   Value: NO GROWTH 5 DAYS     Performed at [facility]   Report Status 09/30/2013 FINAL   Final  CULTURE, BLOOD (ROUTINE X 2)     Status: None   Collection Time    09/24/13 12:50 AM      Result Value Ref Range Status   Specimen Description BLOOD LEFT HAND   Final   Special Requests BOTTLES DRAWN AEROBIC ONLY 4[facility]   Final   Culture  Setup Time     Final   Value: 09/24/2013 03:21     Performed at [facility]   Culture     Final   Value: NO  GROWTH 5 DAYS     Performed at [facility]   Report Status 09/30/2013 FINAL   Final  CLOSTRIDIUM DIFFICILE BY PCR     Status: None   Collection Time    09/27/13  5:07 PM      Result Value Ref Range Status   C difficile by pcr NEGATIVE  NEGATIVE Final    Studies/Results: No results found.  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   Repeat blood cultures remained negative from 2/25.  Continue with nafcillin and rifampin and will need for 6 weeks for presumed PVE through March 24th at least. Using nafcillin instead of Ancef with better CNS penetration for septic emboli.  -will need weekly cbc, cmp -we will arrange follow up in [facility] in about 2-3 weeks  I will sign off, please call with questions    Scharlene Gloss, [facility] for Infectious Disease [place] [email or web address] [phone]  pager   [phone] cell 10/03/2013, 2:21 PM

## 2013-10-03 NOTE — Progress Notes (Signed)
Pt back from dialysis, speech therapy in room consulting with pt.

## 2013-10-03 NOTE — Progress Notes (Signed)
Patient ID: Bradley [name], male   DOB: [birth year], 58 y.o.   MRN: [id number] St is altered mental status. Arousable but falls back asleep very easily. Spoke with family member present.  Vein map shows thrombus in the cephalic vein bilaterally. Possibly candidate for left arm basilic vein transposition.  Will follow up next week for determination of timing for access.  Will "save" left arm for access

## 2013-10-03 NOTE — Progress Notes (Signed)
Dr. Carles Collet stated it was ok to d/c tele during progression today. Telemetry discontinued at this time.  Aaron Edelman, Chanell Nadeau [place]

## 2013-10-03 NOTE — Progress Notes (Signed)
Speech Language Pathology Treatment: Cognitive-Linquistic  Patient Details Name: JOHNATAN LYNDON MRN: [id number] DOB: November 18, 1955 Today's Date: 10/03/2013 Time: [id number] SLP Time Calculation (min): 30 min  Assessment / Plan / Recommendation   Treatment session focused on cognitive goals, orientation to time, awareness to situation, attention and basic problem solving. Patient stated that he was at "[place]", not oriented to time or situation, "im here because they wanted me to have some therapy...verbal therapy" Patient stated that he felt "a little more lonely" now since before this hospitalization. Patient confirmed clinician's statements regarding his recent health problems and was able to provide more information when given semantic cues. Overall, patient able to follow basic directions, but continues with confusion and difficulty with retaining information. Continue with plan of care.    HPI HPI: 58 year old male with history of aortic stenosis, status post pediatric aortic valvulotomy at age 67 followed by tissue AVR ([place] procedure) 02/2000 with cardiac cath 02/2010 with normal coronaries, chronic LBBB, thyroid cancer post thyroidectomy >> hypothyroidism, HTN, and depression, who had been generally unwell for 2-3 weeks. He was seen in the ED on 09/06/13 and assessed as influenza related vomiting, diarrhea, abdominal pain, body aches, fever and mild confusion and was ultimately discharged on Tamiflu. Patient worsened at home with progressive confusion/disorientation and was admitted with sepsis, septic brain emboli and rule out infective endocarditis. MRI findings: MRI Brain (2/10) with multiple bilateral supra tentorial and right cerebellar regions c/w acute partially hemorrhagic infarcts suspected 2/2 septic emboli    Dannial Monarch 10/03/2013, 3:50 PM  Sonia Baller, MA, CCC-SLP [facility] Speech-Language Pathologist

## 2013-10-03 NOTE — Progress Notes (Signed)
Physical Therapy Treatment Patient Details Name: Bradley [name] MRN: [id number] DOB: Oct 21, 1955 Today's Date: 10/03/2013 Time: [id number] PT Time Calculation (min): 8 min  PT Assessment / Plan / Recommendation  History of Present Illness Pt admit with sepsis and r/o endocarditis.  Anoxic brain injury.   PT Comments   Treatment limited due to fatigue from dialysis and abdominal pain.  Was initially willing, then when sat up, just kept trying to lie back down despite cues to stand.  Seemed nauseated as well, but did not want pain or nausea medication.  Will continue to follow.  Follow Up Recommendations  CIR     Does the patient have the potential to tolerate intense rehabilitation   yes  Barriers to Discharge  None      Equipment Recommendations  Rolling walker with 5" wheels    Recommendations for Other Services  None  Frequency Min 3X/week   Progress towards PT Goals Progress towards PT goals: Not progressing toward goals - comment (due to abdominal pain)  Plan Current plan remains appropriate    Precautions / Restrictions Precautions Precautions: Fall Restrictions Weight Bearing Restrictions: No   Pertinent Vitals/Pain C/o 10/10 abdominal pain    Mobility  Bed Mobility Overal bed mobility: Needs Assistance Bed Mobility: Supine to Sit Supine to sit: Min assist Sit to supine: Min assist General bed mobility comments: pulls up on rail and my hand to sit, assist for feet into bed Transfers General transfer comment: attempted x 2, pt not participating, not willing; upon further questioning pt c/o 10/10 abdominal pain was belching and seemed nauseated.  Asked if I could get RN for meds, stated no due to meds don't help.         PT Goals (current goals can now be found in the care plan section)    Visit Information  Last PT Received On: 10/03/13 Assistance Needed: +1 History of Present Illness: Pt admit with sepsis and r/o endocarditis.  Anoxic brain injury.     Subjective Data      Cognition  Cognition Arousal/Alertness: Awake/alert Behavior During Therapy: Flat affect Overall Cognitive Status: Impaired/Different from baseline Area of Impairment: Attention;Memory;Safety/judgement;Problem solving Orientation Level: Time;Situation Current Attention Level: Focused Memory: Decreased short-term memory Following Commands: Follows one step commands with increased time;Follows one step commands inconsistently Safety/Judgement: Decreased awareness of safety Problem Solving: Slow processing;Decreased initiation;Difficulty sequencing;Requires verbal cues;Requires tactile cues    Balance  Balance Overall balance assessment: Needs assistance Sitting-balance support: No upper extremity supported;Feet supported Sitting balance-Leahy Scale: Good Standing balance support: Single extremity supported;Bilateral upper extremity supported;During functional activity Standing balance-Leahy Scale: Fair Standing balance comment: leans back  End of Session PT - End of Session Activity Tolerance: Patient limited by pain Patient left: in bed;with call bell/phone within reach;with bed alarm set   GP     [facility] 10/03/2013, 4:01 PM [name], [place] 10/03/2013

## 2013-10-03 NOTE — Progress Notes (Signed)
OT Cancellation Note  Patient Details Name: Bradley [name] MRN: [id number] DOB: [birth year]   Cancelled Treatment:    Reason Eval/Treat Not Completed: Patient at procedure or test/ unavailable. Pt at hemodialysis, will re attempt later today as time allows/as appropriate  [place], OT 10/03/2013, 9:51 AM

## 2013-10-03 NOTE — Progress Notes (Signed)
Pt off floor via bed for dialysis at this time.

## 2013-10-03 NOTE — Progress Notes (Signed)
Paged IV team for restart.  IV right wrist reddened and burning.

## 2013-10-03 NOTE — Procedures (Signed)
Assessment/Plan:  1 Oliguric AKI, now ESRD  2 Staph aureus bacteremia / embolic strokes / bioprosthetic AVR 2011-  Plan: For permanent access to be arranged (see VVS note), [place]   Tolerating HD. He is hemodynamically stable.  He is more appropriate mentally. Bradley [name]

## 2013-10-04 NOTE — Progress Notes (Signed)
[facility] PROGRESS NOTE  WALLER MONTE [id number] DOB: November 09, 2007 DOA: 09/08/2013 PCP: [name], MD  Assessment/Plan: Severe sepsis secondary to MSSA bacteremia w/ sepsis / infective endocarditis w/ cerebral septic emboli / UTI  -09/08/13 Blood cultures x 2 MSSA - follow up cultures negative  -09/08/13 UA cx with MSSA UTI  -TTE (09/09/13) without vegetation  -Cardiology/ID recommend 6 weeks of antibiotics for presumed endocarditis through October 21, 2013, no TEE at this time  -has prior tissue AVR  -ID directing antibiotics, currently on naficillin and rifampin  -Patient continues to have low grade fever, however, leukocytosis trending downward  -Cdiff PCR negative on 2/16 and 09/16/13 And 09/27/13  -Blood cultures from 09/10/13, 09/24/13, show no growth to date  Toxic metabolic encephalopathy  -gradually improving  -secondary to severe sepsis, azotemia, and septic brain emboli  -MRI Brain (09/09/13) with multiple bilateral supra tentorial and right cerebellar regions consistent with acute partially hemorrhagic infarcts suspected secondary to septic emboli  -Continue supportive care and treat underlying causes  -Neurology has evaluated  -QTc prolonged so haldol not safe  [id number], folic acid [id number], and ammonia 17  Severe thrombocytopenia  -improved with tx of infectious process  -Resolved, Platelet counts dropped to a nadir of 23  -felt to be due to sepsis  Acute Renal Failure/Acute tubular necrosis-->ESRD  -due to bacteremia/immune complex or gentamicin  -baseline renal fnx: 18 / 0.93  -progressive failure since admit  -Transferred to [name] 09/15/2013 for hemodialysis  -Hemodialysis started 09/17/2013  -Renal US normal kidneys, no hydronephrosis  -appreciate renal followup  -AVF/graft planned week of 10/06/13 Abdominal distention/diarrhea  -distention improved  -diarrhea likely due to antibiotics, -C diff negative 2/16 and 2/17, 2/28  NSTEMI (Type II)  -peak  Tropinin 2.26  -Cardiology has no plans for cath at this point  -has stable ST elevation r/t LBBB  Acute Systolic HF and Chronic grade 1 Diastolic CHF  -ECHO Oct 57 with EF 60-65% with severe LVH and no RWMA  -ECHO Sep 09, 2013 EF 45-50% with septal and apical hypokinesis  History of thyroid cancer, post thyroidectomy hypothyroidism  -TSH elevated at 10.174  -Synthroid increased  -Will need monitoring and repeat labs in 4-6 weeks  Hypertension  -Holding lisinopril due to acute renal failure  -Continue metoprolol IV PRN  Hyperglycemia  -Hemoglobin A1c 5.7  -d/c CBG monitoring and ISS  History of depression  -hold Zoloft secondary to acute encephalopathy.  Anxiety/Insomnia  -Will discontinue ambien  -Continue Klonopin QHS PRN, and Ativan daily PRN  -Continue to monitor  Hyponatremia  -Resolved, due to volume overload  -corrected after initiation of hemodialysis  Deconditioning  -CIR reconsulted, and pending evaluation  Code Status: Full  Family Communication: wife and son updated on 10/01/13  Disposition Plan: SNF vs CIR  Procedures/Course  2/09 - admit  2/10 - ID, Heme and Cards consults. TTE EF 45%. No clear vegetations  2/14 - PCCM consult  2/15 - Junctional rhythm with precedex, mental status improved. Transferred to [facility] for possible HD  2/16 - Renal U/S- normal kidneys, no hydronephrosis  2/18 - transferred to ICU for diaphoresis, ST changes and confusion  2/21 - patient pulled out L IJ HD catheter that was placed on 09/17/13  2/21 - L [place] HD catheter placed  2/26 - Conversion of R IJ temp cath to [facility] cath  [place]  Nephrology  PCCM  Infectious disease  Interventional radiology  Cardiology  Neurology  Hematology  CIR  DVT Prophylaxis Heparin  Family Communication:   wife at beside Disposition Plan:   CIR          Procedures/Studies: Dg Chest 2 View  09/06/2013   CLINICAL DATA:  Fever, nausea, vomiting.  EXAM: CHEST  2 VIEW  COMPARISON:  08/17/2011   FINDINGS: Prior median sternotomy and valve replacement. Mild cardiomegaly and vascular congestion. Low lung volumes with bibasilar atelectasis. No effusions. No acute bony abnormality.  IMPRESSION: Cardiomegaly, bibasilar atelectasis.   Electronically Signed   By: Rolm Baptise M.D.   On: 09/06/2013 19:34   Dg Abd 1 View  09/17/2013   CLINICAL DATA:  Abdominal distension  EXAM: ABDOMEN - 1 VIEW  COMPARISON:  CT ABD/PELVIS W CM dated 09/06/2013  FINDINGS: There is at least moderate gas distention of multiple loops of predominantly small bowel, though there may be minimal gaseous distention of the splenic flexure of the colon.  Examination is degraded secondary to the coned field-of-view on solitary provided radiograph.  Nondiagnostic evaluation for pneumoperitoneum secondary supine positioning and exclusion of the lower thorax. No definite pneumatosis or portal venous gas.  No definite abnormal intra-abdominal calcifications.  Degenerative change the lower lumbar spine.  IMPRESSION: Degraded examination with findings concerning for small bowel obstruction though there may be a minimal amount of air within the splenic flexure of the colon. Clinical correlation is advised. Further evaluation with complete abdominal radiographic series may be performed as clinically indicated.   Electronically Signed   By: Sandi Mariscal M.D.   On: 09/17/2013 10:38   Ct Head Wo Contrast  09/12/2013   CLINICAL DATA:  Increased mental status change, evaluate for CVA versus worsening bleed.  EXAM: CT HEAD WITHOUT CONTRAST  TECHNIQUE: Contiguous axial images were obtained from the base of the skull through the vertex without intravenous contrast.  COMPARISON:  Prior MRI and and CT from 09/09/2013.  FINDINGS: Study is degraded by motion artifact.  Vasogenic edema as trending linear hyperdensity within the left parietal lobe is stable not significantly changed relative to the previous examination. Vague hypodensities involving the  supratentorial brain and right cerebellar hemisphere are not significantly changed relative to recent MRI. No definite a new intracranial infarct identified. Previously seen hyperdense foci within the mid bilateral frontal lobes are less conspicuous as compared to prior. No new intracranial hemorrhage. No mass lesion or midline shift. Ventricles are stable in size without evidence of hydrocephalus. No extra-axial fluid collection.  The calvarium is grossly intact. Orbits are normal. Paranasal sinuses and mastoid air cells remain clear.  IMPRESSION: 1. Limited study due to motion. No significant interval change in subacute left parietal infarct with small amount of associated hemorrhage. Additional multi focal infarcts involving the supratentorial brain and right cerebellar hemisphere are not significantly changed as well relative to recent MRI from 09/09/2013. No definite new intracranial infarct identified. No new intracranial hemorrhage.   Electronically Signed   By: Jeannine Boga M.D.   On: 09/12/2013 22:02   Ct Head Wo Contrast  09/09/2013   CLINICAL DATA:  Fever.  Altered mental status.  EXAM: CT HEAD WITHOUT CONTRAST  TECHNIQUE: Contiguous axial images were obtained from the base of the skull through the vertex without intravenous contrast.  COMPARISON:  CT HEAD W/O CM dated 09/06/2013; DG CHEST 1V PORT dated 09/08/2013; CT ABD/PELVIS W CM dated 09/06/2013  FINDINGS: Small focus of linear high attenuation in the left parietal lobe appear similar to the prior exam. The amount of edema at has increased compared to the prior exam suggesting  a subacute process. This appears wedge-shaped in may represent an embolic infarct. There is a similar faint area of high attenuation in the left frontal lobe on image number 22 low with a more subtle area in the right frontal lobe that is more equivocal. The dural venous sinuses appear within normal limits. No mass lesion or midline shift. No hydrocephalus.  The calvarium  is intact. Left-sided nasal septal spur. Mastoid air cells clear. Paranasal sinuses are normal.  IMPRESSION: 1. Increasing edema around linear high attenuation in the left parietal lobe. Followup MRI with and without contrast is recommended for further assessment. The patient presents with an infectious syndrome and septic emboli are in the differential considerations. Infarct with a small amount of hemorrhage or cortical laminar necrosis as well as developmental venous anomaly remain in the differential considerations. MRI should be useful in further characterization. 2. Dr. Leonel Ramsay called discuss the case at 0230 hr on the day of examination.   Electronically Signed   By: Dereck Ligas M.D.   On: 09/09/2013 02:32   Ct Head Wo Contrast  09/06/2013   CLINICAL DATA:  Confusion.  EXAM: CT HEAD WITHOUT CONTRAST  TECHNIQUE: Contiguous axial images were obtained from the base of the skull through the vertex without intravenous contrast.  COMPARISON:  None.  FINDINGS: There is a 1.3 cm curvilinear focus of hyperdensity in the subcortical left parietal lobe (image 25). There is no evidence of surrounding edema. There is no other evidence of acute intracranial hemorrhage. There is no evidence of acute cortical infarct, mass, midline shift, or extra-axial fluid collection. Subcentimeter low density focus in the left caudate is suggestive of a remote lacunar infarct. Ventricles and sulci are normal. There is very mild mucosal thickening in the frontal sinuses and ethmoid air cells bilaterally. Mastoid air cells are clear. Orbits are unremarkable.  IMPRESSION: 1. Curvilinear hyperdensity in the subcortical left parietal lobe. Acute intracranial hemorrhage cannot be completely excluded, however there is no surrounding edema or evidence of hemorrhage elsewhere in the brain and the patient is not presenting in the setting of acute trauma. Given the curvilinear configuration, this may be vascular and represent an  incidental developmental venous anomaly. 2. Remote left caudate lacunar infarct.   Electronically Signed   By: Logan Bores   On: 09/06/2013 22:29   Mr Brain W Wo Contrast  09/09/2013   CLINICAL DATA:  Post aortic valve replacement 2011. Presenting with altered mental status, elevated white count and abnormal CT.  EXAM: MRI HEAD WITHOUT AND WITH CONTRAST  TECHNIQUE: Multiplanar, multiecho pulse sequences of the brain and surrounding structures were obtained without and with intravenous contrast.  CONTRAST:  43mL MULTIHANCE GADOBENATE DIMEGLUMINE 529 MG/ML IV SOLN  COMPARISON:  09/09/2013 and 09/06/2013 CT.  No comparison MR.  FINDINGS: Exam is motion degraded.  Multiple bilateral supra tentorial and right cerebellar regions of restricted motion some of which are partially hemorrhagic with largest area left parietal lobe. In the present clinical setting, these findings are consistent with acute partially hemorrhagic infarcts and possibly related to septic emboli.  At most there is very minimal enhancement of the left parietal partially hemorrhagic infarct.  No intracranial mass lesion seen separate from the above described findings.  Major intracranial vascular structures are patent.  IMPRESSION: Exam is motion degraded.  Multiple bilateral supra tentorial and right cerebellar regions of restricted motion some of which are partially hemorrhagic with largest area left parietal lobe. In the present clinical setting, these findings are consistent with acute  partially hemorrhagic infarcts and possibly related to septic emboli.  These results were called by telephone at the time of interpretation on 09/09/2013 at 11:54 AM to Dr. Algis Liming , who verbally acknowledged these results.   Electronically Signed   By: Chauncey Cruel M.D.   On: 09/09/2013 12:03   Ct Abdomen Pelvis W Contrast  09/06/2013   CLINICAL DATA:  58 year old male abdominal pain, vomiting, diarrhea and low grade fever.  EXAM: CT ABDOMEN AND PELVIS WITH  CONTRAST  TECHNIQUE: Multidetector CT imaging of the abdomen and pelvis was performed using the standard protocol following bolus administration of intravenous contrast.  CONTRAST:  100 cc intravenous Omnipaque 300  COMPARISON:  None.  FINDINGS: Cardiomegaly and aortic valve replacement noted. Mild bibasilar atelectasis/scarring is identified.  The liver, spleen, pancreas, gallbladder adrenal glands and kidneys are unremarkable except for small bilateral renal cysts.  There is no evidence of free fluid, enlarged lymph nodes, biliary dilation or abdominal aortic aneurysm.  The bowel, bladder and appendix are unremarkable. There is no evidence of bowel obstruction, abscess or pneumoperitoneum.  A small right inguinal hernia containing fat is identified.  Mild prostate enlargement is present.  No acute or suspicious bony abnormalities are identified.  IMPRESSION: No evidence of acute abnormality.  Cardiomegaly, aortic valve replacement, prostate enlargement and small right inguinal hernias containing fat.   Electronically Signed   By: Hassan Rowan M.D.   On: 09/06/2013 22:32   Ir Veno/ext/uni Right  09/14/2013   CLINICAL DATA:  Poor venous access, request for PICC  EXAM: DUAL LUMEN LEFT MIDLINE VENOUS CATHETER PLACEMENT WITH ULTRASOUND AND FLUOROSCOPIC GUIDANCE  ADDITIONAL VENOGRAPHY OF THE RIGHT UPPER EXTREMITY  FLUOROSCOPY TIME:  7 MIN AND 30 SECONDS.  PROCEDURE: The patient's family member was advised of the possible risks and complications and agreed to undergo the procedure. The patient was then brought to the angiographic suite for the procedure.  The right and left arm was prepped with chlorhexidine, draped in the usual sterile fashion using maximum barrier technique (cap and mask, sterile gown, sterile gloves, large sterile sheet, hand hygiene and cutaneous antisepsis) and infiltrated locally with 1% Lidocaine.  Ultrasound demonstrated patency of the right and left brachial vein, and this was documented with an  image. Under real-time ultrasound guidance, the right brachial vein was accessed with a 21 gauge micropuncture needle and image documentation was performed. A 0.018 wire was introduced in to the vein. Over this, a dilator introducer was placed, the wire was unable to be advanced proximally so a venogram was performed with 10 cc of Omnipaque, this revealed venous extravasation. The right brachial vein site was abandoned and manual pressure was held until the site was soft with no continued bleeding. The left brachial vein was accessed with a 21 gauge micropuncture needle and multiple guidewires used to advance to the SVC/RA junction. A 5 French dual lumen power-injectable PICC was advanced, however unable to cross proximally to the lower SVC/right atrial junction, the PICC was left midline for this reason. Fluoroscopy during the procedure and fluoro spot radiograph confirms appropriate catheter position. The catheter aspirated and flushed well and was then covered with a sterile dressing.  Complications: Extravasation of 10 cc of Omnipaque to right brachial vein. Unable to advance left brachial vein PICC to the SVC/RA junction, left brachial PICC was placed midline.  IMPRESSION: Successful left arm midline VENOUS CATHETER placement with ultrasound and fluoroscopic guidance. The catheter is ready for use.  Read By:  Tsosie Billing PA-C  Electronically Signed   By: Aletta Edouard M.D.   On: 09/14/2013 11:51   US Renal  09/15/2013   CLINICAL DATA:  Acute renal failure.  EXAM: RENAL/URINARY TRACT ULTRASOUND COMPLETE  COMPARISON:  CT abdomen and pelvis 09/06/2013.  FINDINGS: Right Kidney:  Length: Approximately 12.7 cm. No hydronephrosis. Well-preserved cortex. No shadowing calculi. Normal parenchymal echotexture without focal abnormalities.  Left Kidney:  Length: Approximately 12.5 cm. No hydronephrosis. Well-preserved cortex. No shadowing calculi. Normal parenchymal echotexture without focal abnormalities.  Bladder:   Decompressed by Foley catheter.  IMPRESSION: Normal kidneys.  Specifically, no evidence of hydronephrosis.   Electronically Signed   By: Evangeline Dakin M.D.   On: 09/15/2013 10:55   Ir Fluoro Guide Cv Line Left  09/14/2013   CLINICAL DATA:  Poor venous access, request for PICC  EXAM: DUAL LUMEN LEFT MIDLINE VENOUS CATHETER PLACEMENT WITH ULTRASOUND AND FLUOROSCOPIC GUIDANCE  ADDITIONAL VENOGRAPHY OF THE RIGHT UPPER EXTREMITY  FLUOROSCOPY TIME:  7 MIN AND 30 SECONDS.  PROCEDURE: The patient's family member was advised of the possible risks and complications and agreed to undergo the procedure. The patient was then brought to the angiographic suite for the procedure.  The right and left arm was prepped with chlorhexidine, draped in the usual sterile fashion using maximum barrier technique (cap and mask, sterile gown, sterile gloves, large sterile sheet, hand hygiene and cutaneous antisepsis) and infiltrated locally with 1% Lidocaine.  Ultrasound demonstrated patency of the right and left brachial vein, and this was documented with an image. Under real-time ultrasound guidance, the right brachial vein was accessed with a 21 gauge micropuncture needle and image documentation was performed. A 0.018 wire was introduced in to the vein. Over this, a dilator introducer was placed, the wire was unable to be advanced proximally so a venogram was performed with 10 cc of Omnipaque, this revealed venous extravasation. The right brachial vein site was abandoned and manual pressure was held until the site was soft with no continued bleeding. The left brachial vein was accessed with a 21 gauge micropuncture needle and multiple guidewires used to advance to the SVC/RA junction. A 5 French dual lumen power-injectable PICC was advanced, however unable to cross proximally to the lower SVC/right atrial junction, the PICC was left midline for this reason. Fluoroscopy during the procedure and fluoro spot radiograph confirms  appropriate catheter position. The catheter aspirated and flushed well and was then covered with a sterile dressing.  Complications: Extravasation of 10 cc of Omnipaque to right brachial vein. Unable to advance left brachial vein PICC to the SVC/RA junction, left brachial PICC was placed midline.  IMPRESSION: Successful left arm midline VENOUS CATHETER placement with ultrasound and fluoroscopic guidance. The catheter is ready for use.  Read By:  Tsosie Billing PA-C   Electronically Signed   By: Aletta Edouard M.D.   On: 09/14/2013 11:51   Ir Fluoro Guide Cv Line Right  09/25/2013   INDICATION: Acute renal failure. Temporary catheter needs conversion to a tunneled dialysis catheter.  EXAM: CONVERSION OF NON TUNNELED CATHETER TO A TUNNELED DIALYSIS CATHETER WITH FLUOROSCOPIC GUIDANCE  Physician: Stephan Minister. Anselm Pancoast, MD  MEDICATIONS: 3 mg versed, 150 mcg fentanyl. The patient was already on scheduled antibiotics. A radiology nurse monitored the patient for moderate sedation.  ANESTHESIA/SEDATION: Moderate sedation time: 25 minutes  FLUOROSCOPY TIME:  24 seconds  PROCEDURE: Informed consent was obtained for conversion to a tunneled dialysis catheter. The existing right jugular catheter and right side of the neck was prepped  and draped in sterile fashion. Maximal barrier sterile technique was utilized including caps, mask, sterile gowns, sterile gloves, sterile drape, hand hygiene and skin antiseptic. The skin below the right clavicle was anesthetized with 1% lidocaine. A subcutaneous tract was created and a 23 cm tip to cuff HemoSplit catheter was placed through the tract to the temporary dialysis catheter site. The temporary dialysis catheter was exchanged for a peel-away sheath over a wire. The new dialysis catheter was placed through the peel-away sheath and the tip was placed in the right atrium. Both lumens aspirated and flushed well. Appropriate amount of heparin was placed in both lumens. Gelfoam was placed within  the subcutaneous tract. The vein skin site and catheter exit site were sutured with 2-0 Ethilon sutures.  COMPLICATIONS: None  FINDINGS: Catheter tip in the right atrium.  IMPRESSION: Successful conversion of a temporary dialysis catheter to a tunneled dialysis catheter with fluoroscopy.   Electronically Signed   By: Markus Daft M.D.   On: 09/25/2013 16:25   Ir Fluoro Guide Cv Line Right  09/21/2013   CLINICAL DATA:  Renal failure  EXAM: IR RIGHT FLOURO GUIDE CV LINE; IR ULTRASOUND GUIDANCE VASC ACCESS RIGHT  MEDICATIONS AND MEDICAL HISTORY: None  ANESTHESIA/SEDATION: None  CONTRAST:  None  FLUOROSCOPY TIME:  18 seconds.  PROCEDURE: The procedure, risks, benefits, and alternatives were explained to the patient. Questions regarding the procedure were encouraged and answered. The patient understands and consents to the procedure.  The right neck was prepped with Betadine in a sterile fashion, and a sterile drape was applied covering the operative field. A sterile gown and sterile gloves were used for the procedure.  Under sonographic guidance, a micropuncture needle was inserted into the right internal jugular vein and removed over a 018 wire which was up sized to a 3 J. the 20 cm temporary dialysis catheter was advanced over the wire to the right atrium. It was flushed and sewn in place.  COMPLICATIONS: None  FINDINGS: Tip of the temporary dialysis catheter is at the right atrium  IMPRESSION: Successful placement of a temporary right internal jugular vein dialysis catheter.   Electronically Signed   By: Maryclare Bean M.D.   On: 09/21/2013 12:15   Ir US Guide Vasc Access Left  09/14/2013   CLINICAL DATA:  Poor venous access, request for PICC  EXAM: DUAL LUMEN LEFT MIDLINE VENOUS CATHETER PLACEMENT WITH ULTRASOUND AND FLUOROSCOPIC GUIDANCE  ADDITIONAL VENOGRAPHY OF THE RIGHT UPPER EXTREMITY  FLUOROSCOPY TIME:  7 MIN AND 30 SECONDS.  PROCEDURE: The patient's family member was advised of the possible risks and  complications and agreed to undergo the procedure. The patient was then brought to the angiographic suite for the procedure.  The right and left arm was prepped with chlorhexidine, draped in the usual sterile fashion using maximum barrier technique (cap and mask, sterile gown, sterile gloves, large sterile sheet, hand hygiene and cutaneous antisepsis) and infiltrated locally with 1% Lidocaine.  Ultrasound demonstrated patency of the right and left brachial vein, and this was documented with an image. Under real-time ultrasound guidance, the right brachial vein was accessed with a 21 gauge micropuncture needle and image documentation was performed. A 0.018 wire was introduced in to the vein. Over this, a dilator introducer was placed, the wire was unable to be advanced proximally so a venogram was performed with 10 cc of Omnipaque, this revealed venous extravasation. The right brachial vein site was abandoned and manual pressure was held until the site was  soft with no continued bleeding. The left brachial vein was accessed with a 21 gauge micropuncture needle and multiple guidewires used to advance to the SVC/RA junction. A 5 French dual lumen power-injectable PICC was advanced, however unable to cross proximally to the lower SVC/right atrial junction, the PICC was left midline for this reason. Fluoroscopy during the procedure and fluoro spot radiograph confirms appropriate catheter position. The catheter aspirated and flushed well and was then covered with a sterile dressing.  Complications: Extravasation of 10 cc of Omnipaque to right brachial vein. Unable to advance left brachial vein PICC to the SVC/RA junction, left brachial PICC was placed midline.  IMPRESSION: Successful left arm midline VENOUS CATHETER placement with ultrasound and fluoroscopic guidance. The catheter is ready for use.  Read By:  Tsosie Billing PA-C   Electronically Signed   By: Aletta Edouard M.D.   On: 09/14/2013 11:51   Ir US Guide Vasc  Access Right  09/21/2013   CLINICAL DATA:  Renal failure  EXAM: IR RIGHT FLOURO GUIDE CV LINE; IR ULTRASOUND GUIDANCE VASC ACCESS RIGHT  MEDICATIONS AND MEDICAL HISTORY: None  ANESTHESIA/SEDATION: None  CONTRAST:  None  FLUOROSCOPY TIME:  18 seconds.  PROCEDURE: The procedure, risks, benefits, and alternatives were explained to the patient. Questions regarding the procedure were encouraged and answered. The patient understands and consents to the procedure.  The right neck was prepped with Betadine in a sterile fashion, and a sterile drape was applied covering the operative field. A sterile gown and sterile gloves were used for the procedure.  Under sonographic guidance, a micropuncture needle was inserted into the right internal jugular vein and removed over a 018 wire which was up sized to a 3 J. the 20 cm temporary dialysis catheter was advanced over the wire to the right atrium. It was flushed and sewn in place.  COMPLICATIONS: None  FINDINGS: Tip of the temporary dialysis catheter is at the right atrium  IMPRESSION: Successful placement of a temporary right internal jugular vein dialysis catheter.   Electronically Signed   By: Maryclare Bean M.D.   On: 09/21/2013 12:15   Dg Chest Port 1 View  09/18/2013   CLINICAL DATA:  Respiratory distress, shortness of breath.  EXAM: PORTABLE CHEST - 1 VIEW  COMPARISON:  09/17/2013  FINDINGS: Prior CABG and valve replacement. Left pass catheterization is in stable position. Cardiomegaly. Left lower lobe opacity, likely atelectasis. No effusions or overt edema.  IMPRESSION: Cardiomegaly.  Left lower lobe atelectasis.   Electronically Signed   By: Rolm Baptise M.D.   On: 09/18/2013 06:27   Dg Chest Port 1 View  09/17/2013   CLINICAL DATA:  Hemodialysis catheter placement  EXAM: PORTABLE CHEST - 1 VIEW  COMPARISON:  DG CHEST 1V PORT dated 09/17/2013; DG CHEST 1V PORT dated 09/13/2013; DG CHEST 1V PORT dated 09/08/2013  FINDINGS: Grossly unchanged enlarged cardiac silhouette and  mediastinal contours post median sternotomy and valve replacement. Interval placement of left jugular approach dialysis catheter with tip projected over the superior aspect of the SVC. Lung volumes remain reduced. Pulmonary vasculature remains indistinct. Grossly unchanged perihilar and bibasilar heterogeneous / consolidative opacities, left greater than right. No definite pleural effusion or pneumothorax. Grossly unchanged bones including postsurgical/traumatic deformity of the distal end of the right clavicle.  IMPRESSION: 1. Interval placement of left jugular approach central venous catheter with tip projected over the superior SVC. No pneumothorax. 2. Similar findings of pulmonary edema and perihilar/bibasilar opacities, left greater than right, atelectasis versus infiltrate.  Electronically Signed   By: Sandi Mariscal M.D.   On: 09/17/2013 10:41   Dg Chest Port 1 View  09/17/2013   CLINICAL DATA:  Confirm PICC line placement.  EXAM: PORTABLE CHEST - 1 VIEW  COMPARISON:  09/13/2013  FINDINGS: No PICC line is visualized within the chest or shoulder regions bilaterally.  There is cardiomegaly. Prior median sternotomy and valve replacement. Mild vascular congestion. Left lower lobe airspace opacity again noted, unchanged. No visible effusions.  IMPRESSION: No visible PICC line.  Stable cardiomegaly and vascular congestion.  Stable left lower lobe atelectasis or consolidation/pneumonia.   Electronically Signed   By: Rolm Baptise M.D.   On: 09/17/2013 04:08   Dg Chest Port 1 View  09/13/2013   CLINICAL DATA:  Respiratory distress  EXAM: PORTABLE CHEST - 1 VIEW  COMPARISON:  DG CHEST 1V PORT dated 09/08/2013  FINDINGS: Evidence of CABG reidentified with moderate enlargement of the cardiac silhouette and central vascular congestion. Retrocardiac opacity persists. No overt alveolar edema or other filling processes otherwise identified. Trace if any left pleural fluid. No acute osseous finding.  IMPRESSION:  Cardiomegaly with central vascular congestion and persistent retrocardiac atelectasis or possibly early pneumonia.   Electronically Signed   By: Conchita Paris M.D.   On: 09/13/2013 13:06   Dg Chest Port 1 View  09/08/2013   CLINICAL DATA:  Chest pain, hypertension  EXAM: PORTABLE CHEST - 1 VIEW  COMPARISON:  September 06, 2013  FINDINGS: The heart size and mediastinal contours are stable. Patient is status post prior CABG. The heart size is enlarged. Patchy consolidation of left lung base is identified. There is mild atelectasis of right lung base. There is no pulmonary edema. There is probable small left pleural effusion. The visualized skeletal structures are stable.  IMPRESSION: Early pneumonia of left lung base with small left pleural effusion.   Electronically Signed   By: Abelardo Diesel M.D.   On: 09/08/2013 23:41   Dg Abd Portable 2v  09/18/2013   CLINICAL DATA:  58 year old male with abdominal distension. Possible small bowel obstruction. Initial encounter.  EXAM: PORTABLE ABDOMEN - 2 VIEW  COMPARISON:  09/17/2013 and earlier.  FINDINGS: Supine and portable cross-table lateral views of the abdomen. Decreased gaseous distension of small bowel. Numerous gas-filled mostly nondilated loops persist. Persistent right colon gas. No pneumoperitoneum identified. Mild motion artifact on both views. Stable visualized osseous structures.  IMPRESSION: Improved bowel gas pattern with decreased gaseous distension of small bowel and persistent colon gas. Pattern could reflect improving small bowel obstruction or ileus.  No free air identified.   Electronically Signed   By: Lars Pinks M.D.   On: 09/18/2013 17:50         Subjective: Patient remains intermittently confused. Denies any fevers, chills, chest discomfort, shortness breath, nausea, vomiting. He complaints of chronic abdominal discomfort. He continues to have loose stools. No hematochezia or melena.  Objective: Filed Vitals:   10/03/13 2155 10/04/13  0432 10/04/13 0945 10/04/13 1411  BP: 122/81 141/90 142/87 135/74  Pulse: 95 105 94 85  Temp: 98.4 F (36.9 C) 99 F (37.2 C) 98.5 F (36.9 C) 98.3 F (36.8 C)  TempSrc: Oral Oral Oral Oral  Resp: 18 20 20 20   Height: 6\' 1"  (1.854 m)     Weight: 91.672 kg (202 lb 1.6 oz)     SpO2: 95% 95% 96% 96%    Intake/Output Summary (Last 24 hours) at 10/04/13 1741 Last data filed at 10/04/13 1345  Gross  per 24 hour  Intake   1380 ml  Output      0 ml  Net   1380 ml   Weight change: 0.922 kg (2 lb 0.5 oz) Exam:   General:  Pt is alert, follows commands appropriately, not in acute distress  HEENT: No icterus, No thrush,  [place]/AT  Cardiovascular: RRR, S1/S2, no rubs, no gallops  Respiratory: Bibasilar crackles. No wheezing. Good air movement.  Abdomen: Soft/+BS, non tender, non distended, no guarding  Extremities: 1+ edema, No lymphangitis, No petechiae, No rashes, no synovitis  Data Reviewed: Basic Metabolic Panel:  Recent Labs Lab 09/28/13 0700 09/29/13 0410 09/29/13 0814 09/30/13 0443 10/01/13 0540  NA 139 133* 136* 137 137  K 4.0 3.5* 3.3* 3.8 4.0  CL 99 93* 95* 97 96  CO2 24 23 24 24 25   GLUCOSE 103* 139* 121* 91 106*  BUN 23 30* 32* 14 21  CREATININE 6.92* 8.51* 8.69* 5.19* 7.21*  CALCIUM 8.4 8.4 8.2* 8.2* 8.2*  PHOS  --   --  7.8*  --   --    Liver Function Tests:  Recent Labs Lab 09/29/13 0814  ALBUMIN 1.4*   No results found for this basename: LIPASE, AMYLASE,  in the last 168 hours No results found for this basename: AMMONIA,  in the last 168 hours CBC:  Recent Labs Lab 09/28/13 0700 09/29/13 0410 09/30/13 0443  WBC 18.3* 16.4* 14.2*  HGB 8.5* 8.6* 7.9*  HCT 24.5* 24.9* 22.7*  MCV 83.9 84.7 86.6  PLT 310 317 321   Cardiac Enzymes: No results found for this basename: CKTOTAL, CKMB, CKMBINDEX, TROPONINI,  in the last 168 hours BNP: No components found with this basename: POCBNP,  CBG:  Recent Labs Lab 10/01/13 1736 10/01/13 1816  10/02/13 0801 10/02/13 1219 10/02/13 1627  GLUCAP 146* 132* 105* 102* 105*    Recent Results (from the past 240 hour(s))  CLOSTRIDIUM DIFFICILE BY PCR     Status: None   Collection Time    09/27/13  5:07 PM      Result Value Ref Range Status   C difficile by pcr NEGATIVE  NEGATIVE Final     Scheduled Meds: . antiseptic oral rinse  15 mL Mouth Rinse q12n4p  . aspirin EC  81 mg Oral Daily  . darbepoetin (ARANESP) injection - DIALYSIS  60 mcg Intravenous Q Fri-HD  . feeding supplement (RESOURCE BREEZE)  1 Container Oral BID BM  . ferric gluconate (FERRLECIT/NULECIT) IV  125 mg Intravenous Weekly  . heparin subcutaneous  5,000 Units Subcutaneous 3 times per day  . levothyroxine  200 mcg Oral QAC breakfast  . multivitamin  1 tablet Oral QHS  . nafcillin IV  2 g Intravenous Q4H  . pantoprazole  40 mg Oral Q1200  . rifampin  600 mg Oral Daily  . sevelamer carbonate  1.6 g Oral TID WC  . sodium chloride  3 mL Intravenous Q12H   Continuous Infusions:    Amontae Ng, DO  [facility] Pager [phone]  If 7PM-7AM, please contact night-coverage [email or web address] Password [email or web address] 10/04/2013, 5:41 PM   LOS: 26 days

## 2013-10-04 NOTE — Progress Notes (Signed)
Admit: 09/08/2013 LOS: 19  41M new ESRD with prosthetic AV endocarditis, MSSA.  Subjective:  VVS following, plan for access next week Sitting in nurses station, confused    03/06 0701 - 03/07 0700 In: 910 [P.O.:660; IV Piggyback:250] Out: 2496   Filed Weights   10/03/13 0749 10/03/13 1129 10/03/13 2155  Weight: 94 kg (207 lb 3.7 oz) 91.6 kg (201 lb 15.1 oz) 91.672 kg (202 lb 1.6 oz)    Current meds: reviewed, naficillin/rifampin, aranesp 60qFri, FeGluc 125qMon, Renvela Current Labs: reviewed    Physical Exam:  Blood pressure 142/87, pulse 94, temperature 98.5 F (36.9 C), temperature source Oral, resp. rate 20, height 6\' 1"  (1.854 m), weight 91.672 kg (202 lb 1.6 oz), SpO2 96.00%. NAD, in chair RRR CTAB No LEE COnfused to location, events  Assessment 1. New ESRD 2. AV endocarditis and septic emboli 3. Anemia 4. Hyperphosphatemia 5. AMS  Plan 1. Cont iHD on MWF schedule 2. BVT tentative for next week 3. CLIP process initiated 4. Cont ESA, Fe 5. Cont [place] Jansky MD 10/04/2013, 11:06 AM   Recent Labs Lab 09/29/13 0814 09/30/13 0443 10/01/13 0540  NA 136* 137 137  K 3.3* 3.8 4.0  CL 95* 97 96  CO2 24 24 25   GLUCOSE 121* 91 106*  BUN 32* 14 21  CREATININE 8.69* 5.19* 7.21*  CALCIUM 8.2* 8.2* 8.2*  PHOS 7.8*  --   --     Recent Labs Lab 09/28/13 0700 09/29/13 0410 09/30/13 0443  WBC 18.3* 16.4* 14.2*  HGB 8.5* 8.6* 7.9*  HCT 24.5* 24.9* 22.7*  MCV 83.9 84.7 86.6  PLT 310 317 321

## 2013-10-05 LAB — BASIC METABOLIC PANEL
BUN: 18 mg/dL (ref 6–23)
CO2: 25 mEq/L (ref 19–32)
Calcium: 8.4 mg/dL (ref 8.4–10.5)
Chloride: 93 mEq/L — ABNORMAL LOW (ref 96–112)
Creatinine, Ser: 7.61 mg/dL — ABNORMAL HIGH (ref 0.50–1.35)
GFR calc Af Amer: 8 mL/min — ABNORMAL LOW (ref >90)
GFR calc non Af Amer: 7 mL/min — ABNORMAL LOW (ref >90)
Glucose, Bld: 105 mg/dL — ABNORMAL HIGH (ref 70–99)
Potassium: 4.2 mEq/L (ref 3.7–5.3)
Sodium: 133 mEq/L — ABNORMAL LOW (ref 137–147)

## 2013-10-05 LAB — CBC
HCT: 22.9 % — ABNORMAL LOW (ref 39.0–52.0)
Hemoglobin: 7.9 g/dL — ABNORMAL LOW (ref 13.0–17.0)
MCH: 30.5 pg (ref 26.0–34.0)
MCHC: 34.5 g/dL (ref 30.0–36.0)
MCV: 88.4 fL (ref 78.0–100.0)
Platelets: 365 10*3/uL (ref 150–400)
RBC: 2.59 MIL/uL — ABNORMAL LOW (ref 4.22–5.81)
RDW: 19.4 % — ABNORMAL HIGH (ref 11.5–15.5)
WBC: 14.8 10*3/uL — ABNORMAL HIGH (ref 4.0–10.5)

## 2013-10-05 NOTE — Progress Notes (Signed)
[facility] PROGRESS NOTE  Bradley [name] [id number] DOB: 15-May-1956 DOA: 09/08/2013 PCP: [name], MD  Assessment/Plan: Severe sepsis secondary to MSSA bacteremia w/ sepsis / infective endocarditis w/ cerebral septic emboli / UTI  -09/08/13 Blood cultures x 2 MSSA - follow up cultures negative  -09/08/13 UA cx with MSSA UTI  -TTE (09/09/13) without vegetation  -Cardiology/ID recommend 6 weeks of antibiotics for presumed endocarditis through October 21, 2013, no TEE at this time  -has prior tissue AVR  -ID directing antibiotics, currently on naficillin and rifampin  -Patient continues to have low grade fever, however, leukocytosis trending downward  -Cdiff PCR negative on 2/16 and 09/16/13 And 09/27/13  -Blood cultures from 09/10/13, 09/24/13, show no growth to date  Toxic metabolic encephalopathy  -gradually improving  -secondary to severe sepsis, azotemia, and septic brain emboli  -MRI Brain (09/09/13) with multiple bilateral supra tentorial and right cerebellar regions consistent with acute partially hemorrhagic infarcts suspected secondary to septic emboli  -Continue supportive care and treat underlying causes  -Neurology has evaluated  -QTc prolonged so haldol not safe  -[id number], folic acid [id number], and ammonia 17  Severe thrombocytopenia  -improved with tx of infectious process  -Resolved, Platelet counts dropped to a nadir of 23  -felt to be due to sepsis  Acute Renal Failure/Acute tubular necrosis-->ESRD  -due to bacteremia/immune complex or gentamicin  -baseline renal fnx: 18 / 0.93  -progressive failure since admit  -Transferred to [name] 09/15/2013 for hemodialysis  -Hemodialysis started 09/17/2013  -Renal US normal kidneys, no hydronephrosis  -appreciate renal followup  -AVF/graft planned week of 10/06/13  Abdominal distention/diarrhea  -distention improved  -diarrhea likely due to antibiotics,  -C diff negative 2/16 and 2/17, 2/28  NSTEMI (Type II)  -peak  Tropinin 2.26  -Cardiology has no plans for cath at this point  -has stable ST elevation r/t LBBB  Acute Systolic HF and Chronic grade 1 Diastolic CHF  -ECHO Oct 123456 with EF 60-65% with severe LVH and no RWMA  -ECHO Sep 09, 2013 EF 45-50% with septal and apical hypokinesis  History of thyroid cancer, post thyroidectomy hypothyroidism  -TSH elevated at 10.174  -Synthroid increased  -Will need monitoring and repeat labs in 4-6 weeks  Hypertension  -Holding lisinopril due to acute renal failure  -Continue metoprolol IV PRN  -remains controlled Hyperglycemia  -Hemoglobin A1c 5.7  -d/c CBG monitoring and ISS  History of depression  -hold Zoloft secondary to acute encephalopathy.  Anxiety/Insomnia  -Will discontinue ambien  -Continue Klonopin QHS PRN, and Ativan daily PRN  -Continue to monitor  Hyponatremia  -Resolved, due to volume overload  -corrected after initiation of hemodialysis  Deconditioning  -CIR reconsulted, and pending evaluation  Code Status: Full  Family Communication: wife and son updated on 10/01/13  Disposition Plan: SNF vs CIR  Procedures/Course  2/09 - admit  2/10 - ID, Heme and Cards consults. TTE EF 45%. No clear vegetations  2/14 - PCCM consult  2/15 - Junctional rhythm with precedex, mental status improved. Transferred to [facility] for possible HD  2/16 - Renal U/S- normal kidneys, no hydronephrosis  2/18 - transferred to ICU for diaphoresis, ST changes and confusion  2/21 - patient pulled out L IJ HD catheter that was placed on 09/17/13  2/21 - L [place] HD catheter placed  2/26 - Conversion of R IJ temp cath to [facility] cath  Media  Nephrology  PCCM  Infectious disease  Interventional radiology  Cardiology  Neurology  Hematology  CIR  DVT Prophylaxis Heparin  Family Communication: wife at beside  Disposition Plan: CIR          Procedures/Studies: Dg Chest 2 View  09/06/2013   CLINICAL DATA:  Fever, nausea, vomiting.  EXAM: CHEST  2 VIEW  COMPARISON:   08/17/2011  FINDINGS: Prior median sternotomy and valve replacement. Mild cardiomegaly and vascular congestion. Low lung volumes with bibasilar atelectasis. No effusions. No acute bony abnormality.  IMPRESSION: Cardiomegaly, bibasilar atelectasis.   Electronically Signed   By: Rolm Baptise M.D.   On: 09/06/2013 19:34   Dg Abd 1 View  09/17/2013   CLINICAL DATA:  Abdominal distension  EXAM: ABDOMEN - 1 VIEW  COMPARISON:  CT ABD/PELVIS W CM dated 09/06/2013  FINDINGS: There is at least moderate gas distention of multiple loops of predominantly small bowel, though there may be minimal gaseous distention of the splenic flexure of the colon.  Examination is degraded secondary to the coned field-of-view on solitary provided radiograph.  Nondiagnostic evaluation for pneumoperitoneum secondary supine positioning and exclusion of the lower thorax. No definite pneumatosis or portal venous gas.  No definite abnormal intra-abdominal calcifications.  Degenerative change the lower lumbar spine.  IMPRESSION: Degraded examination with findings concerning for small bowel obstruction though there may be a minimal amount of air within the splenic flexure of the colon. Clinical correlation is advised. Further evaluation with complete abdominal radiographic series may be performed as clinically indicated.   Electronically Signed   By: Sandi Mariscal M.D.   On: 09/17/2013 10:38   Ct Head Wo Contrast  09/12/2013   CLINICAL DATA:  Increased mental status change, evaluate for CVA versus worsening bleed.  EXAM: CT HEAD WITHOUT CONTRAST  TECHNIQUE: Contiguous axial images were obtained from the base of the skull through the vertex without intravenous contrast.  COMPARISON:  Prior MRI and and CT from 09/09/2013.  FINDINGS: Study is degraded by motion artifact.  Vasogenic edema as trending linear hyperdensity within the left parietal lobe is stable not significantly changed relative to the previous examination. Vague hypodensities involving  the supratentorial brain and right cerebellar hemisphere are not significantly changed relative to recent MRI. No definite a new intracranial infarct identified. Previously seen hyperdense foci within the mid bilateral frontal lobes are less conspicuous as compared to prior. No new intracranial hemorrhage. No mass lesion or midline shift. Ventricles are stable in size without evidence of hydrocephalus. No extra-axial fluid collection.  The calvarium is grossly intact. Orbits are normal. Paranasal sinuses and mastoid air cells remain clear.  IMPRESSION: 1. Limited study due to motion. No significant interval change in subacute left parietal infarct with small amount of associated hemorrhage. Additional multi focal infarcts involving the supratentorial brain and right cerebellar hemisphere are not significantly changed as well relative to recent MRI from 09/09/2013. No definite new intracranial infarct identified. No new intracranial hemorrhage.   Electronically Signed   By: Jeannine Boga M.D.   On: 09/12/2013 22:02   Ct Head Wo Contrast  09/09/2013   CLINICAL DATA:  Fever.  Altered mental status.  EXAM: CT HEAD WITHOUT CONTRAST  TECHNIQUE: Contiguous axial images were obtained from the base of the skull through the vertex without intravenous contrast.  COMPARISON:  CT HEAD W/O CM dated 09/06/2013; DG CHEST 1V PORT dated 09/08/2013; CT ABD/PELVIS W CM dated 09/06/2013  FINDINGS: Small focus of linear high attenuation in the left parietal lobe appear similar to the prior exam. The amount of edema at has increased compared to the prior exam  suggesting a subacute process. This appears wedge-shaped in may represent an embolic infarct. There is a similar faint area of high attenuation in the left frontal lobe on image number 22 low with a more subtle area in the right frontal lobe that is more equivocal. The dural venous sinuses appear within normal limits. No mass lesion or midline shift. No hydrocephalus.  The  calvarium is intact. Left-sided nasal septal spur. Mastoid air cells clear. Paranasal sinuses are normal.  IMPRESSION: 1. Increasing edema around linear high attenuation in the left parietal lobe. Followup MRI with and without contrast is recommended for further assessment. The patient presents with an infectious syndrome and septic emboli are in the differential considerations. Infarct with a small amount of hemorrhage or cortical laminar necrosis as well as developmental venous anomaly remain in the differential considerations. MRI should be useful in further characterization. 2. Dr. Leonel Ramsay called discuss the case at 0230 hr on the day of examination.   Electronically Signed   By: Dereck Ligas M.D.   On: 09/09/2013 02:32   Ct Head Wo Contrast  09/06/2013   CLINICAL DATA:  Confusion.  EXAM: CT HEAD WITHOUT CONTRAST  TECHNIQUE: Contiguous axial images were obtained from the base of the skull through the vertex without intravenous contrast.  COMPARISON:  None.  FINDINGS: There is a 1.3 cm curvilinear focus of hyperdensity in the subcortical left parietal lobe (image 25). There is no evidence of surrounding edema. There is no other evidence of acute intracranial hemorrhage. There is no evidence of acute cortical infarct, mass, midline shift, or extra-axial fluid collection. Subcentimeter low density focus in the left caudate is suggestive of a remote lacunar infarct. Ventricles and sulci are normal. There is very mild mucosal thickening in the frontal sinuses and ethmoid air cells bilaterally. Mastoid air cells are clear. Orbits are unremarkable.  IMPRESSION: 1. Curvilinear hyperdensity in the subcortical left parietal lobe. Acute intracranial hemorrhage cannot be completely excluded, however there is no surrounding edema or evidence of hemorrhage elsewhere in the brain and the patient is not presenting in the setting of acute trauma. Given the curvilinear configuration, this may be vascular and represent an  incidental developmental venous anomaly. 2. Remote left caudate lacunar infarct.   Electronically Signed   By: Logan Bores   On: 09/06/2013 22:29   Mr Brain W Wo Contrast  09/09/2013   CLINICAL DATA:  Post aortic valve replacement 2011. Presenting with altered mental status, elevated white count and abnormal CT.  EXAM: MRI HEAD WITHOUT AND WITH CONTRAST  TECHNIQUE: Multiplanar, multiecho pulse sequences of the brain and surrounding structures were obtained without and with intravenous contrast.  CONTRAST:  70mL MULTIHANCE GADOBENATE DIMEGLUMINE 529 MG/ML IV SOLN  COMPARISON:  09/09/2013 and 09/06/2013 CT.  No comparison MR.  FINDINGS: Exam is motion degraded.  Multiple bilateral supra tentorial and right cerebellar regions of restricted motion some of which are partially hemorrhagic with largest area left parietal lobe. In the present clinical setting, these findings are consistent with acute partially hemorrhagic infarcts and possibly related to septic emboli.  At most there is very minimal enhancement of the left parietal partially hemorrhagic infarct.  No intracranial mass lesion seen separate from the above described findings.  Major intracranial vascular structures are patent.  IMPRESSION: Exam is motion degraded.  Multiple bilateral supra tentorial and right cerebellar regions of restricted motion some of which are partially hemorrhagic with largest area left parietal lobe. In the present clinical setting, these findings are consistent with  acute partially hemorrhagic infarcts and possibly related to septic emboli.  These results were called by telephone at the time of interpretation on 09/09/2013 at 11:54 AM to Dr. Algis Liming , who verbally acknowledged these results.   Electronically Signed   By: Chauncey Cruel M.D.   On: 09/09/2013 12:03   Ct Abdomen Pelvis W Contrast  09/06/2013   CLINICAL DATA:  58 year old male abdominal pain, vomiting, diarrhea and low grade fever.  EXAM: CT ABDOMEN AND PELVIS WITH  CONTRAST  TECHNIQUE: Multidetector CT imaging of the abdomen and pelvis was performed using the standard protocol following bolus administration of intravenous contrast.  CONTRAST:  100 cc intravenous Omnipaque 300  COMPARISON:  None.  FINDINGS: Cardiomegaly and aortic valve replacement noted. Mild bibasilar atelectasis/scarring is identified.  The liver, spleen, pancreas, gallbladder adrenal glands and kidneys are unremarkable except for small bilateral renal cysts.  There is no evidence of free fluid, enlarged lymph nodes, biliary dilation or abdominal aortic aneurysm.  The bowel, bladder and appendix are unremarkable. There is no evidence of bowel obstruction, abscess or pneumoperitoneum.  A small right inguinal hernia containing fat is identified.  Mild prostate enlargement is present.  No acute or suspicious bony abnormalities are identified.  IMPRESSION: No evidence of acute abnormality.  Cardiomegaly, aortic valve replacement, prostate enlargement and small right inguinal hernias containing fat.   Electronically Signed   By: Hassan Rowan M.D.   On: 09/06/2013 22:32   Ir Veno/ext/uni Right  09/14/2013   CLINICAL DATA:  Poor venous access, request for PICC  EXAM: DUAL LUMEN LEFT MIDLINE VENOUS CATHETER PLACEMENT WITH ULTRASOUND AND FLUOROSCOPIC GUIDANCE  ADDITIONAL VENOGRAPHY OF THE RIGHT UPPER EXTREMITY  FLUOROSCOPY TIME:  7 MIN AND 30 SECONDS.  PROCEDURE: The patient's family member was advised of the possible risks and complications and agreed to undergo the procedure. The patient was then brought to the angiographic suite for the procedure.  The right and left arm was prepped with chlorhexidine, draped in the usual sterile fashion using maximum barrier technique (cap and mask, sterile gown, sterile gloves, large sterile sheet, hand hygiene and cutaneous antisepsis) and infiltrated locally with 1% Lidocaine.  Ultrasound demonstrated patency of the right and left brachial vein, and this was documented with an  image. Under real-time ultrasound guidance, the right brachial vein was accessed with a 21 gauge micropuncture needle and image documentation was performed. A 0.018 wire was introduced in to the vein. Over this, a dilator introducer was placed, the wire was unable to be advanced proximally so a venogram was performed with 10 cc of Omnipaque, this revealed venous extravasation. The right brachial vein site was abandoned and manual pressure was held until the site was soft with no continued bleeding. The left brachial vein was accessed with a 21 gauge micropuncture needle and multiple guidewires used to advance to the SVC/RA junction. A 5 French dual lumen power-injectable PICC was advanced, however unable to cross proximally to the lower SVC/right atrial junction, the PICC was left midline for this reason. Fluoroscopy during the procedure and fluoro spot radiograph confirms appropriate catheter position. The catheter aspirated and flushed well and was then covered with a sterile dressing.  Complications: Extravasation of 10 cc of Omnipaque to right brachial vein. Unable to advance left brachial vein PICC to the SVC/RA junction, left brachial PICC was placed midline.  IMPRESSION: Successful left arm midline VENOUS CATHETER placement with ultrasound and fluoroscopic guidance. The catheter is ready for use.  Read By:  Tsosie Billing PA-C  Electronically Signed   By: Aletta Edouard M.D.   On: 09/14/2013 11:51   US Renal  09/15/2013   CLINICAL DATA:  Acute renal failure.  EXAM: RENAL/URINARY TRACT ULTRASOUND COMPLETE  COMPARISON:  CT abdomen and pelvis 09/06/2013.  FINDINGS: Right Kidney:  Length: Approximately 12.7 cm. No hydronephrosis. Well-preserved cortex. No shadowing calculi. Normal parenchymal echotexture without focal abnormalities.  Left Kidney:  Length: Approximately 12.5 cm. No hydronephrosis. Well-preserved cortex. No shadowing calculi. Normal parenchymal echotexture without focal abnormalities.  Bladder:   Decompressed by Foley catheter.  IMPRESSION: Normal kidneys.  Specifically, no evidence of hydronephrosis.   Electronically Signed   By: Evangeline Dakin M.D.   On: 09/15/2013 10:55   Ir Fluoro Guide Cv Line Left  09/14/2013   CLINICAL DATA:  Poor venous access, request for PICC  EXAM: DUAL LUMEN LEFT MIDLINE VENOUS CATHETER PLACEMENT WITH ULTRASOUND AND FLUOROSCOPIC GUIDANCE  ADDITIONAL VENOGRAPHY OF THE RIGHT UPPER EXTREMITY  FLUOROSCOPY TIME:  7 MIN AND 30 SECONDS.  PROCEDURE: The patient's family member was advised of the possible risks and complications and agreed to undergo the procedure. The patient was then brought to the angiographic suite for the procedure.  The right and left arm was prepped with chlorhexidine, draped in the usual sterile fashion using maximum barrier technique (cap and mask, sterile gown, sterile gloves, large sterile sheet, hand hygiene and cutaneous antisepsis) and infiltrated locally with 1% Lidocaine.  Ultrasound demonstrated patency of the right and left brachial vein, and this was documented with an image. Under real-time ultrasound guidance, the right brachial vein was accessed with a 21 gauge micropuncture needle and image documentation was performed. A 0.018 wire was introduced in to the vein. Over this, a dilator introducer was placed, the wire was unable to be advanced proximally so a venogram was performed with 10 cc of Omnipaque, this revealed venous extravasation. The right brachial vein site was abandoned and manual pressure was held until the site was soft with no continued bleeding. The left brachial vein was accessed with a 21 gauge micropuncture needle and multiple guidewires used to advance to the SVC/RA junction. A 5 French dual lumen power-injectable PICC was advanced, however unable to cross proximally to the lower SVC/right atrial junction, the PICC was left midline for this reason. Fluoroscopy during the procedure and fluoro spot radiograph confirms  appropriate catheter position. The catheter aspirated and flushed well and was then covered with a sterile dressing.  Complications: Extravasation of 10 cc of Omnipaque to right brachial vein. Unable to advance left brachial vein PICC to the SVC/RA junction, left brachial PICC was placed midline.  IMPRESSION: Successful left arm midline VENOUS CATHETER placement with ultrasound and fluoroscopic guidance. The catheter is ready for use.  Read By:  Tsosie Billing PA-C   Electronically Signed   By: Aletta Edouard M.D.   On: 09/14/2013 11:51   Ir Fluoro Guide Cv Line Right  09/25/2013   INDICATION: Acute renal failure. Temporary catheter needs conversion to a tunneled dialysis catheter.  EXAM: CONVERSION OF NON TUNNELED CATHETER TO A TUNNELED DIALYSIS CATHETER WITH FLUOROSCOPIC GUIDANCE  Physician: Stephan Minister. Anselm Pancoast, MD  MEDICATIONS: 3 mg versed, 150 mcg fentanyl. The patient was already on scheduled antibiotics. A radiology nurse monitored the patient for moderate sedation.  ANESTHESIA/SEDATION: Moderate sedation time: 25 minutes  FLUOROSCOPY TIME:  24 seconds  PROCEDURE: Informed consent was obtained for conversion to a tunneled dialysis catheter. The existing right jugular catheter and right side of the neck was prepped  and draped in sterile fashion. Maximal barrier sterile technique was utilized including caps, mask, sterile gowns, sterile gloves, sterile drape, hand hygiene and skin antiseptic. The skin below the right clavicle was anesthetized with 1% lidocaine. A subcutaneous tract was created and a 23 cm tip to cuff HemoSplit catheter was placed through the tract to the temporary dialysis catheter site. The temporary dialysis catheter was exchanged for a peel-away sheath over a wire. The new dialysis catheter was placed through the peel-away sheath and the tip was placed in the right atrium. Both lumens aspirated and flushed well. Appropriate amount of heparin was placed in both lumens. Gelfoam was placed within  the subcutaneous tract. The vein skin site and catheter exit site were sutured with 2-0 Ethilon sutures.  COMPLICATIONS: None  FINDINGS: Catheter tip in the right atrium.  IMPRESSION: Successful conversion of a temporary dialysis catheter to a tunneled dialysis catheter with fluoroscopy.   Electronically Signed   By: Markus Daft M.D.   On: 09/25/2013 16:25   Ir Fluoro Guide Cv Line Right  09/21/2013   CLINICAL DATA:  Renal failure  EXAM: IR RIGHT FLOURO GUIDE CV LINE; IR ULTRASOUND GUIDANCE VASC ACCESS RIGHT  MEDICATIONS AND MEDICAL HISTORY: None  ANESTHESIA/SEDATION: None  CONTRAST:  None  FLUOROSCOPY TIME:  18 seconds.  PROCEDURE: The procedure, risks, benefits, and alternatives were explained to the patient. Questions regarding the procedure were encouraged and answered. The patient understands and consents to the procedure.  The right neck was prepped with Betadine in a sterile fashion, and a sterile drape was applied covering the operative field. A sterile gown and sterile gloves were used for the procedure.  Under sonographic guidance, a micropuncture needle was inserted into the right internal jugular vein and removed over a 018 wire which was up sized to a 3 J. the 20 cm temporary dialysis catheter was advanced over the wire to the right atrium. It was flushed and sewn in place.  COMPLICATIONS: None  FINDINGS: Tip of the temporary dialysis catheter is at the right atrium  IMPRESSION: Successful placement of a temporary right internal jugular vein dialysis catheter.   Electronically Signed   By: Maryclare Bean M.D.   On: 09/21/2013 12:15   Ir US Guide Vasc Access Left  09/14/2013   CLINICAL DATA:  Poor venous access, request for PICC  EXAM: DUAL LUMEN LEFT MIDLINE VENOUS CATHETER PLACEMENT WITH ULTRASOUND AND FLUOROSCOPIC GUIDANCE  ADDITIONAL VENOGRAPHY OF THE RIGHT UPPER EXTREMITY  FLUOROSCOPY TIME:  7 MIN AND 30 SECONDS.  PROCEDURE: The patient's family member was advised of the possible risks and  complications and agreed to undergo the procedure. The patient was then brought to the angiographic suite for the procedure.  The right and left arm was prepped with chlorhexidine, draped in the usual sterile fashion using maximum barrier technique (cap and mask, sterile gown, sterile gloves, large sterile sheet, hand hygiene and cutaneous antisepsis) and infiltrated locally with 1% Lidocaine.  Ultrasound demonstrated patency of the right and left brachial vein, and this was documented with an image. Under real-time ultrasound guidance, the right brachial vein was accessed with a 21 gauge micropuncture needle and image documentation was performed. A 0.018 wire was introduced in to the vein. Over this, a dilator introducer was placed, the wire was unable to be advanced proximally so a venogram was performed with 10 cc of Omnipaque, this revealed venous extravasation. The right brachial vein site was abandoned and manual pressure was held until the site was  soft with no continued bleeding. The left brachial vein was accessed with a 21 gauge micropuncture needle and multiple guidewires used to advance to the SVC/RA junction. A 5 French dual lumen power-injectable PICC was advanced, however unable to cross proximally to the lower SVC/right atrial junction, the PICC was left midline for this reason. Fluoroscopy during the procedure and fluoro spot radiograph confirms appropriate catheter position. The catheter aspirated and flushed well and was then covered with a sterile dressing.  Complications: Extravasation of 10 cc of Omnipaque to right brachial vein. Unable to advance left brachial vein PICC to the SVC/RA junction, left brachial PICC was placed midline.  IMPRESSION: Successful left arm midline VENOUS CATHETER placement with ultrasound and fluoroscopic guidance. The catheter is ready for use.  Read By:  Tsosie Billing PA-C   Electronically Signed   By: Aletta Edouard M.D.   On: 09/14/2013 11:51   Ir US Guide Vasc  Access Right  09/21/2013   CLINICAL DATA:  Renal failure  EXAM: IR RIGHT FLOURO GUIDE CV LINE; IR ULTRASOUND GUIDANCE VASC ACCESS RIGHT  MEDICATIONS AND MEDICAL HISTORY: None  ANESTHESIA/SEDATION: None  CONTRAST:  None  FLUOROSCOPY TIME:  18 seconds.  PROCEDURE: The procedure, risks, benefits, and alternatives were explained to the patient. Questions regarding the procedure were encouraged and answered. The patient understands and consents to the procedure.  The right neck was prepped with Betadine in a sterile fashion, and a sterile drape was applied covering the operative field. A sterile gown and sterile gloves were used for the procedure.  Under sonographic guidance, a micropuncture needle was inserted into the right internal jugular vein and removed over a 018 wire which was up sized to a 3 J. the 20 cm temporary dialysis catheter was advanced over the wire to the right atrium. It was flushed and sewn in place.  COMPLICATIONS: None  FINDINGS: Tip of the temporary dialysis catheter is at the right atrium  IMPRESSION: Successful placement of a temporary right internal jugular vein dialysis catheter.   Electronically Signed   By: Maryclare Bean M.D.   On: 09/21/2013 12:15   Dg Chest Port 1 View  09/18/2013   CLINICAL DATA:  Respiratory distress, shortness of breath.  EXAM: PORTABLE CHEST - 1 VIEW  COMPARISON:  09/17/2013  FINDINGS: Prior CABG and valve replacement. Left pass catheterization is in stable position. Cardiomegaly. Left lower lobe opacity, likely atelectasis. No effusions or overt edema.  IMPRESSION: Cardiomegaly.  Left lower lobe atelectasis.   Electronically Signed   By: Rolm Baptise M.D.   On: 09/18/2013 06:27   Dg Chest Port 1 View  09/17/2013   CLINICAL DATA:  Hemodialysis catheter placement  EXAM: PORTABLE CHEST - 1 VIEW  COMPARISON:  DG CHEST 1V PORT dated 09/17/2013; DG CHEST 1V PORT dated 09/13/2013; DG CHEST 1V PORT dated 09/08/2013  FINDINGS: Grossly unchanged enlarged cardiac silhouette and  mediastinal contours post median sternotomy and valve replacement. Interval placement of left jugular approach dialysis catheter with tip projected over the superior aspect of the SVC. Lung volumes remain reduced. Pulmonary vasculature remains indistinct. Grossly unchanged perihilar and bibasilar heterogeneous / consolidative opacities, left greater than right. No definite pleural effusion or pneumothorax. Grossly unchanged bones including postsurgical/traumatic deformity of the distal end of the right clavicle.  IMPRESSION: 1. Interval placement of left jugular approach central venous catheter with tip projected over the superior SVC. No pneumothorax. 2. Similar findings of pulmonary edema and perihilar/bibasilar opacities, left greater than right, atelectasis versus infiltrate.  Electronically Signed   By: Sandi Mariscal M.D.   On: 09/17/2013 10:41   Dg Chest Port 1 View  09/17/2013   CLINICAL DATA:  Confirm PICC line placement.  EXAM: PORTABLE CHEST - 1 VIEW  COMPARISON:  09/13/2013  FINDINGS: No PICC line is visualized within the chest or shoulder regions bilaterally.  There is cardiomegaly. Prior median sternotomy and valve replacement. Mild vascular congestion. Left lower lobe airspace opacity again noted, unchanged. No visible effusions.  IMPRESSION: No visible PICC line.  Stable cardiomegaly and vascular congestion.  Stable left lower lobe atelectasis or consolidation/pneumonia.   Electronically Signed   By: Rolm Baptise M.D.   On: 09/17/2013 04:08   Dg Chest Port 1 View  09/13/2013   CLINICAL DATA:  Respiratory distress  EXAM: PORTABLE CHEST - 1 VIEW  COMPARISON:  DG CHEST 1V PORT dated 09/08/2013  FINDINGS: Evidence of CABG reidentified with moderate enlargement of the cardiac silhouette and central vascular congestion. Retrocardiac opacity persists. No overt alveolar edema or other filling processes otherwise identified. Trace if any left pleural fluid. No acute osseous finding.  IMPRESSION:  Cardiomegaly with central vascular congestion and persistent retrocardiac atelectasis or possibly early pneumonia.   Electronically Signed   By: Conchita Paris M.D.   On: 09/13/2013 13:06   Dg Chest Port 1 View  09/08/2013   CLINICAL DATA:  Chest pain, hypertension  EXAM: PORTABLE CHEST - 1 VIEW  COMPARISON:  September 06, 2013  FINDINGS: The heart size and mediastinal contours are stable. Patient is status post prior CABG. The heart size is enlarged. Patchy consolidation of left lung base is identified. There is mild atelectasis of right lung base. There is no pulmonary edema. There is probable small left pleural effusion. The visualized skeletal structures are stable.  IMPRESSION: Early pneumonia of left lung base with small left pleural effusion.   Electronically Signed   By: Abelardo Diesel M.D.   On: 09/08/2013 23:41   Dg Abd Portable 2v  09/18/2013   CLINICAL DATA:  58 year old male with abdominal distension. Possible small bowel obstruction. Initial encounter.  EXAM: PORTABLE ABDOMEN - 2 VIEW  COMPARISON:  09/17/2013 and earlier.  FINDINGS: Supine and portable cross-table lateral views of the abdomen. Decreased gaseous distension of small bowel. Numerous gas-filled mostly nondilated loops persist. Persistent right colon gas. No pneumoperitoneum identified. Mild motion artifact on both views. Stable visualized osseous structures.  IMPRESSION: Improved bowel gas pattern with decreased gaseous distension of small bowel and persistent colon gas. Pattern could reflect improving small bowel obstruction or ileus.  No free air identified.   Electronically Signed   By: Lars Pinks M.D.   On: 09/18/2013 17:50         Subjective: Patient is in good spirits today. Denies fevers, chills, chest discomfort, shortness of breath, abdominal pain. Continues to have loose stools. No headache or dizziness.  Objective: Filed Vitals:   10/04/13 1411 10/04/13 2042 10/05/13 0545 10/05/13 0846  BP: 135/74 133/84 119/69  126/90  Pulse: 85 99 97 95  Temp: 98.3 F (36.8 C) 99.2 F (37.3 C) 98.8 F (37.1 C) 98.7 F (37.1 C)  TempSrc: Oral Oral Oral Oral  Resp: 20 18 18 18   Height:  6\' 1"  (1.854 m)    Weight:  91.763 kg (202 lb 4.8 oz)    SpO2: 96% 94% 96% 96%    Intake/Output Summary (Last 24 hours) at 10/05/13 1457 Last data filed at 10/05/13 0430  Gross per 24 hour  Intake  203 ml  Output      0 ml  Net    203 ml   Weight change: -2.237 kg (-4 lb 14.9 oz) Exam:   General:  Pt is alert, follows commands appropriately, not in acute distress  HEENT: No icterus, No thrush,  /AT  Cardiovascular: RRR, S1/S2, no rubs, no gallops  Respiratory: Basilar crackles. No wheezing. Good air movement.  Abdomen: Soft/+BS, non tender, non distended, no guarding  Extremities: 1+LE edema, No lymphangitis, No petechiae, No rashes, no synovitis  Data Reviewed: Basic Metabolic Panel:  Recent Labs Lab 09/29/13 0410 09/29/13 0814 09/30/13 0443 10/01/13 0540 10/05/13 0520  NA 133* 136* 137 137 133*  K 3.5* 3.3* 3.8 4.0 4.2  CL 93* 95* 97 96 93*  CO2 23 24 24 25 25   GLUCOSE 139* 121* 91 106* 105*  BUN 30* 32* 14 21 18   CREATININE 8.51* 8.69* 5.19* 7.21* 7.61*  CALCIUM 8.4 8.2* 8.2* 8.2* 8.4  PHOS  --  7.8*  --   --   --    Liver Function Tests:  Recent Labs Lab 09/29/13 0814  ALBUMIN 1.4*   No results found for this basename: LIPASE, AMYLASE,  in the last 168 hours No results found for this basename: AMMONIA,  in the last 168 hours CBC:  Recent Labs Lab 09/29/13 0410 09/30/13 0443 10/05/13 0520  WBC 16.4* 14.2* 14.8*  HGB 8.6* 7.9* 7.9*  HCT 24.9* 22.7* 22.9*  MCV 84.7 86.6 88.4  PLT 317 321 365   Cardiac Enzymes: No results found for this basename: CKTOTAL, CKMB, CKMBINDEX, TROPONINI,  in the last 168 hours BNP: No components found with this basename: POCBNP,  CBG:  Recent Labs Lab 10/01/13 1736 10/01/13 1816 10/02/13 0801 10/02/13 1219 10/02/13 1627  GLUCAP 146*  132* 105* 102* 105*    Recent Results (from the past 240 hour(s))  CLOSTRIDIUM DIFFICILE BY PCR     Status: None   Collection Time    09/27/13  5:07 PM      Result Value Ref Range Status   C difficile by pcr NEGATIVE  NEGATIVE Final     Scheduled Meds: . antiseptic oral rinse  15 mL Mouth Rinse q12n4p  . aspirin EC  81 mg Oral Daily  . darbepoetin (ARANESP) injection - DIALYSIS  60 mcg Intravenous Q Fri-HD  . feeding supplement (RESOURCE BREEZE)  1 Container Oral BID BM  . ferric gluconate (FERRLECIT/NULECIT) IV  125 mg Intravenous Weekly  . heparin subcutaneous  5,000 Units Subcutaneous 3 times per day  . levothyroxine  200 mcg Oral QAC breakfast  . multivitamin  1 tablet Oral QHS  . nafcillin IV  2 g Intravenous Q4H  . pantoprazole  40 mg Oral Q1200  . rifampin  600 mg Oral Daily  . sevelamer carbonate  1.6 g Oral TID WC  . sodium chloride  3 mL Intravenous Q12H   Continuous Infusions:    Amr Sturtevant, DO  [facility] Pager [phone]  If 7PM-7AM, please contact night-coverage [email or web address] Password [email or web address] 10/05/2013, 2:57 PM   LOS: 27 days

## 2013-10-05 NOTE — Progress Notes (Signed)
Pt alert and responsive.  VSS. Denies pain.  Wife at bedside.

## 2013-10-05 NOTE — Progress Notes (Signed)
Admit: 09/08/2013 LOS: 37  33M new ESRD with prosthetic AV endocarditis, MSSA.  Subjective:  VVS following, plan for access next week NAEON    03/07 0701 - 03/08 0700 In: 843 [P.O.:540; I.V.:3; IV Piggyback:300] Out: -   Filed Weights   10/03/13 1129 10/03/13 2155 10/04/13 2042  Weight: 91.6 kg (201 lb 15.1 oz) 91.672 kg (202 lb 1.6 oz) 91.763 kg (202 lb 4.8 oz)    Current meds: reviewed, naficillin/rifampin, aranesp 60qFri, FeGluc 125qMon, Renvela Current Labs: reviewed    Physical Exam:  Blood pressure 126/90, pulse 95, temperature 98.7 F (37.1 C), temperature source Oral, resp. rate 18, height 6\' 1"  (1.854 m), weight 91.763 kg (202 lb 4.8 oz), SpO2 96.00%. NAD, in chair RRR CTAB No LEE COnfused to location, events  Assessment 1. New ESRD 2. AV endocarditis and septic emboli, MSSA on nafcillin 3. Anemia 4. Hyperphosphatemia 5. AMS  Plan 1. Cont iHD on MWF schedule 2. BVT tentative for next week 3. CLIP process initiated 4. Cont ESA, Fe 5. Cont [place] Jansky MD 10/05/2013, 9:00 AM   Recent Labs Lab 09/29/13 0814 09/30/13 0443 10/01/13 0540 10/05/13 0520  NA 136* 137 137 133*  K 3.3* 3.8 4.0 4.2  CL 95* 97 96 93*  CO2 24 24 25 25   GLUCOSE 121* 91 106* 105*  BUN 32* 14 21 18   CREATININE 8.69* 5.19* 7.21* 7.61*  CALCIUM 8.2* 8.2* 8.2* 8.4  PHOS 7.8*  --   --   --     Recent Labs Lab 09/29/13 0410 09/30/13 0443 10/05/13 0520  WBC 16.4* 14.2* 14.8*  HGB 8.6* 7.9* 7.9*  HCT 24.9* 22.7* 22.9*  MCV 84.7 86.6 88.4  PLT 317 321 365

## 2013-10-06 LAB — CLOSTRIDIUM DIFFICILE BY PCR: Toxigenic C. Difficile by PCR: NEGATIVE

## 2013-10-06 MED ORDER — HEPARIN SODIUM (PORCINE) 1000 UNIT/ML DIALYSIS: 20.0000 [IU]/kg | INTRAMUSCULAR | Status: DC | PRN

## 2013-10-06 MED ORDER — MORPHINE SULFATE 2 MG/ML IJ SOLN
INTRAMUSCULAR | Status: AC
  Filled 2013-10-06: qty 1

## 2013-10-06 NOTE — Progress Notes (Signed)
NUTRITION FOLLOW UP  Intervention:   Continue Resource Breeze PO BID. Recommend diet liberalization to maximize PO choices. RD discussed renal friendly meal/snack ideas for family to bring. RD to continue to follow nutrition care plan.  Nutrition Dx:   Inadequate oral intake related to poor appetite AEB limited po intake. Ongoing.  Goal:   Pt to meet >/= 90% of their estimated nutrition needs; unmet  Monitor:   Weight, Labs, Intake  Assessment:   58 year old male with s/p [place] with bioprosthetic AV in august 2011. 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 Admitted 09/08/2013 with encephalopathy and septic picture with imaging showing septic embolli of brain and cultures revelaing MSSA bacteremia. Concern is for MSSA endocarditis but TEE unable to be done due to encephalopathy, and thrombocytopenia. On 09/13/13 develping renal failure (d2 of gent stopped) and worsening encephalopathy.  Continues on Renal/CHO Modified medium meals.  Currently eating about 50% of meals. Family bringing food, pt reports that he does not like hospital type foods. Pt confused at this time. Ordered for Resource Breeze PO BID. Pt is currently sleeping, pt with untouched meal tray. Food from [employer] (apple pie) at bedside. Pt with a Resource Breeze at bedside, with at least 75% of it consumed.   Perm cath placed 2/26. Renal following; first HD session 2/18. Continues on HD MWF. MD planning CLIP process. Plan for L arm AV access on 3/11.  Meds of note: levothyroxine, rena-vite, renvela Sodium low at 133 CBG's: 105, 102, 105  Height: Ht Readings from Last 1 Encounters:  10/05/13 6\' 1"  (1.854 m)    Weight Status:   Wt Readings from Last 1 Encounters:  10/06/13 198 lb 6.6 oz (90 kg)  Admit wt 200 lb  Re-estimated needs:  Kcal: 2200-2400  Protein: 80-90 g  Fluid: per MD  Skin: intact  Diet Order: Diabetic + Renal   Intake/Output Summary  (Last 24 hours) at 10/06/13 1505 Last data filed at 10/06/13 1238  Gross per 24 hour  Intake    570 ml  Output   2400 ml  Net  -1830 ml    Last BM: 3/8 - diarrhea  Labs:   Recent Labs Lab 09/30/13 0443 10/01/13 0540 10/05/13 0520  NA 137 137 133*  K 3.8 4.0 4.2  CL 97 96 93*  CO2 24 25 25   BUN 14 21 18   CREATININE 5.19* 7.21* 7.61*  CALCIUM 8.2* 8.2* 8.4  GLUCOSE 91 106* 105*    CBG (last 3)  No results found for this basename: GLUCAP,  in the last 72 hours  Scheduled Meds: . antiseptic oral rinse  15 mL Mouth Rinse q12n4p  . aspirin EC  81 mg Oral Daily  . darbepoetin (ARANESP) injection - DIALYSIS  60 mcg Intravenous Q Fri-HD  . feeding supplement (RESOURCE BREEZE)  1 Container Oral BID BM  . ferric gluconate (FERRLECIT/NULECIT) IV  125 mg Intravenous Weekly  . heparin subcutaneous  5,000 Units Subcutaneous 3 times per day  . levothyroxine  200 mcg Oral QAC breakfast  . morphine      . multivitamin  1 tablet Oral QHS  . nafcillin IV  2 g Intravenous Q4H  . pantoprazole  40 mg Oral Q1200  . rifampin  600 mg Oral Daily  . sevelamer carbonate  1.6 g Oral TID WC  . sodium chloride  3 mL Intravenous Q12H    Continuous Infusions: none    Aldona Bar  Lenard Forth, RD, LDN Inpatient Registered Dietitian Pager: [phone] After-hours pager: [phone]

## 2013-10-06 NOTE — Procedures (Signed)
Pt seen on HD.  BFR 400.  Ap 130 Vp 90.  SBP 147.  No CO's.  Awaiting outpt HD spot. Note plans for CIR eval.

## 2013-10-06 NOTE — Progress Notes (Signed)
Patient ID: Bradley [name], male   DOB: Mar 30, 1956, 58 y.o.   MRN: [id number] More oriented today. Up in chair. Explained need for access for long-term hemodialysis. Discuss findings a vein map last week. Recommend left arm AV access on Wednesday, March 11. We'll reimage his veins with ultrasound at the time of surgery to determine if fistula or graft as best option.

## 2013-10-06 NOTE — Progress Notes (Signed)
[facility] PROGRESS NOTE  Bradley [name] [id number] DOB: [birth year] DOA: 09/08/2013 PCP: [name], MD  Assessment/Plan:  Severe sepsis secondary to MSSA bacteremia w/ sepsis / infective endocarditis w/ cerebral septic emboli / UTI  -09/08/13 Blood cultures x 2 MSSA - follow up cultures negative  -09/08/13 UA cx with MSSA UTI  -TTE (09/09/13) without vegetation  -Cardiology/ID recommend 6 weeks of antibiotics for presumed endocarditis through October 21, 2013, no TEE at this time  -has prior tissue AVR  -ID directing antibiotics, currently on naficillin and rifampin  -Patient continues to have low grade fever, however, leukocytosis trending downward  -Cdiff PCR negative on 2/16 and 09/16/13 And 09/27/13  -Blood cultures from 09/10/13, 09/24/13, show no growth to date  Toxic metabolic encephalopathy  -gradually improving but has intermittent episodes of impulsivity -secondary to severe sepsis, azotemia, and septic brain emboli  -MRI Brain (09/09/13) with multiple bilateral supra tentorial and right cerebellar regions consistent with acute partially hemorrhagic infarcts suspected secondary to septic emboli  -Continue supportive care and treat underlying causes  -Neurology has evaluated  -QTc prolonged so haldol not safe  -[id number], folic acid [id number], and ammonia 17  Severe thrombocytopenia  -improved with tx of infectious process  -Resolved, Platelet counts dropped to a nadir of 23  -felt to be due to sepsis  Acute Renal Failure/Acute tubular necrosis-->ESRD  -due to bacteremia/immune complex or gentamicin  -baseline renal fnx: 18 / 0.93  -progressive failure since admit  -Transferred to [name] 09/15/2013 for hemodialysis  -Hemodialysis started 09/17/2013  -Renal US normal kidneys, no hydronephrosis  -appreciate renal followup  -AVF/graft planned week of 10/08/13  Abdominal distention/diarrhea  -distention improved  -diarrhea likely due to antibiotics,  -C diff negative  2/16 and 2/17, 2/28  NSTEMI (Type II)  -peak Tropinin 2.26  -Cardiology has no plans for cath at this point  -has stable ST elevation r/t LBBB  Acute Systolic HF and Chronic grade 1 Diastolic CHF  -ECHO Oct 123456 with EF 60-65% with severe LVH and no RWMA  -ECHO Sep 09, 2013 EF 45-50% with septal and apical hypokinesis  History of thyroid cancer, post thyroidectomy hypothyroidism  -TSH elevated at 10.174  -Synthroid increased  -Will need monitoring and repeat labs in 4-6 weeks  Hypertension  -Holding lisinopril due to acute renal failure  -Continue metoprolol IV PRN  -remains controlled  Hyperglycemia  -Hemoglobin A1c 5.7  -d/c CBG monitoring and ISS  History of depression  -hold Zoloft secondary to acute encephalopathy.  Anxiety/Insomnia  -Will discontinue ambien  -Continue Klonopin QHS PRN, and Ativan daily PRN  -Continue to monitor  Hyponatremia  -Resolved, due to volume overload  -corrected after initiation of hemodialysis  Deconditioning  -CIR  Code Status: Full   Procedures/Course  2/09 - admit  2/10 - ID, Heme and Cards consults. TTE EF 45%. No clear vegetations  2/14 - PCCM consult  2/15 - Junctional rhythm with precedex, mental status improved. Transferred to [facility] for possible HD  2/16 - Renal U/S- normal kidneys, no hydronephrosis  2/18 - transferred to ICU for diaphoresis, ST changes and confusion  2/21 - patient pulled out L IJ HD catheter that was placed on 09/17/13  2/21 - L [place] HD catheter placed  2/26 - Conversion of R IJ temp cath to [facility] cath  [place]  Nephrology  PCCM  Infectious disease  Interventional radiology  Cardiology  Neurology  Hematology  CIR  DVT Prophylaxis Heparin  Family Communication: wife at beside  Disposition Plan: CIR         Procedures/Studies: Dg Chest 2 View  09/06/2013   CLINICAL DATA:  Fever, nausea, vomiting.  EXAM: CHEST  2 VIEW  COMPARISON:  08/17/2011  FINDINGS: Prior median sternotomy and valve replacement.  Mild cardiomegaly and vascular congestion. Low lung volumes with bibasilar atelectasis. No effusions. No acute bony abnormality.  IMPRESSION: Cardiomegaly, bibasilar atelectasis.   Electronically Signed   By: Rolm Baptise M.D.   On: 09/06/2013 19:34   Dg Abd 1 View  09/17/2013   CLINICAL DATA:  Abdominal distension  EXAM: ABDOMEN - 1 VIEW  COMPARISON:  CT ABD/PELVIS W CM dated 09/06/2013  FINDINGS: There is at least moderate gas distention of multiple loops of predominantly small bowel, though there may be minimal gaseous distention of the splenic flexure of the colon.  Examination is degraded secondary to the coned field-of-view on solitary provided radiograph.  Nondiagnostic evaluation for pneumoperitoneum secondary supine positioning and exclusion of the lower thorax. No definite pneumatosis or portal venous gas.  No definite abnormal intra-abdominal calcifications.  Degenerative change the lower lumbar spine.  IMPRESSION: Degraded examination with findings concerning for small bowel obstruction though there may be a minimal amount of air within the splenic flexure of the colon. Clinical correlation is advised. Further evaluation with complete abdominal radiographic series may be performed as clinically indicated.   Electronically Signed   By: Sandi Mariscal M.D.   On: 09/17/2013 10:38   Ct Head Wo Contrast  09/12/2013   CLINICAL DATA:  Increased mental status change, evaluate for CVA versus worsening bleed.  EXAM: CT HEAD WITHOUT CONTRAST  TECHNIQUE: Contiguous axial images were obtained from the base of the skull through the vertex without intravenous contrast.  COMPARISON:  Prior MRI and and CT from 09/09/2013.  FINDINGS: Study is degraded by motion artifact.  Vasogenic edema as trending linear hyperdensity within the left parietal lobe is stable not significantly changed relative to the previous examination. Vague hypodensities involving the supratentorial brain and right cerebellar hemisphere are not  significantly changed relative to recent MRI. No definite a new intracranial infarct identified. Previously seen hyperdense foci within the mid bilateral frontal lobes are less conspicuous as compared to prior. No new intracranial hemorrhage. No mass lesion or midline shift. Ventricles are stable in size without evidence of hydrocephalus. No extra-axial fluid collection.  The calvarium is grossly intact. Orbits are normal. Paranasal sinuses and mastoid air cells remain clear.  IMPRESSION: 1. Limited study due to motion. No significant interval change in subacute left parietal infarct with small amount of associated hemorrhage. Additional multi focal infarcts involving the supratentorial brain and right cerebellar hemisphere are not significantly changed as well relative to recent MRI from 09/09/2013. No definite new intracranial infarct identified. No new intracranial hemorrhage.   Electronically Signed   By: Jeannine Boga M.D.   On: 09/12/2013 22:02   Ct Head Wo Contrast  09/09/2013   CLINICAL DATA:  Fever.  Altered mental status.  EXAM: CT HEAD WITHOUT CONTRAST  TECHNIQUE: Contiguous axial images were obtained from the base of the skull through the vertex without intravenous contrast.  COMPARISON:  CT HEAD W/O CM dated 09/06/2013; DG CHEST 1V PORT dated 09/08/2013; CT ABD/PELVIS W CM dated 09/06/2013  FINDINGS: Small focus of linear high attenuation in the left parietal lobe appear similar to the prior exam. The amount of edema at has increased compared to the prior exam suggesting a subacute process. This appears wedge-shaped in may represent an  embolic infarct. There is a similar faint area of high attenuation in the left frontal lobe on image number 22 low with a more subtle area in the right frontal lobe that is more equivocal. The dural venous sinuses appear within normal limits. No mass lesion or midline shift. No hydrocephalus.  The calvarium is intact. Left-sided nasal septal spur. Mastoid air cells  clear. Paranasal sinuses are normal.  IMPRESSION: 1. Increasing edema around linear high attenuation in the left parietal lobe. Followup MRI with and without contrast is recommended for further assessment. The patient presents with an infectious syndrome and septic emboli are in the differential considerations. Infarct with a small amount of hemorrhage or cortical laminar necrosis as well as developmental venous anomaly remain in the differential considerations. MRI should be useful in further characterization. 2. Dr. Leonel Ramsay called discuss the case at 0230 hr on the day of examination.   Electronically Signed   By: Dereck Ligas M.D.   On: 09/09/2013 02:32   Ct Head Wo Contrast  09/06/2013   CLINICAL DATA:  Confusion.  EXAM: CT HEAD WITHOUT CONTRAST  TECHNIQUE: Contiguous axial images were obtained from the base of the skull through the vertex without intravenous contrast.  COMPARISON:  None.  FINDINGS: There is a 1.3 cm curvilinear focus of hyperdensity in the subcortical left parietal lobe (image 25). There is no evidence of surrounding edema. There is no other evidence of acute intracranial hemorrhage. There is no evidence of acute cortical infarct, mass, midline shift, or extra-axial fluid collection. Subcentimeter low density focus in the left caudate is suggestive of a remote lacunar infarct. Ventricles and sulci are normal. There is very mild mucosal thickening in the frontal sinuses and ethmoid air cells bilaterally. Mastoid air cells are clear. Orbits are unremarkable.  IMPRESSION: 1. Curvilinear hyperdensity in the subcortical left parietal lobe. Acute intracranial hemorrhage cannot be completely excluded, however there is no surrounding edema or evidence of hemorrhage elsewhere in the brain and the patient is not presenting in the setting of acute trauma. Given the curvilinear configuration, this may be vascular and represent an incidental developmental venous anomaly. 2. Remote left caudate  lacunar infarct.   Electronically Signed   By: Logan Bores   On: 09/06/2013 22:29   Mr Brain W Wo Contrast  09/09/2013   CLINICAL DATA:  Post aortic valve replacement 2011. Presenting with altered mental status, elevated white count and abnormal CT.  EXAM: MRI HEAD WITHOUT AND WITH CONTRAST  TECHNIQUE: Multiplanar, multiecho pulse sequences of the brain and surrounding structures were obtained without and with intravenous contrast.  CONTRAST:  63mL MULTIHANCE GADOBENATE DIMEGLUMINE 529 MG/ML IV SOLN  COMPARISON:  09/09/2013 and 09/06/2013 CT.  No comparison MR.  FINDINGS: Exam is motion degraded.  Multiple bilateral supra tentorial and right cerebellar regions of restricted motion some of which are partially hemorrhagic with largest area left parietal lobe. In the present clinical setting, these findings are consistent with acute partially hemorrhagic infarcts and possibly related to septic emboli.  At most there is very minimal enhancement of the left parietal partially hemorrhagic infarct.  No intracranial mass lesion seen separate from the above described findings.  Major intracranial vascular structures are patent.  IMPRESSION: Exam is motion degraded.  Multiple bilateral supra tentorial and right cerebellar regions of restricted motion some of which are partially hemorrhagic with largest area left parietal lobe. In the present clinical setting, these findings are consistent with acute partially hemorrhagic infarcts and possibly related to septic emboli.  These results were called by telephone at the time of interpretation on 09/09/2013 at 11:54 AM to Dr. Algis Liming , who verbally acknowledged these results.   Electronically Signed   By: Chauncey Cruel M.D.   On: 09/09/2013 12:03   Ct Abdomen Pelvis W Contrast  09/06/2013   CLINICAL DATA:  58 year old male abdominal pain, vomiting, diarrhea and low grade fever.  EXAM: CT ABDOMEN AND PELVIS WITH CONTRAST  TECHNIQUE: Multidetector CT imaging of the abdomen and  pelvis was performed using the standard protocol following bolus administration of intravenous contrast.  CONTRAST:  100 cc intravenous Omnipaque 300  COMPARISON:  None.  FINDINGS: Cardiomegaly and aortic valve replacement noted. Mild bibasilar atelectasis/scarring is identified.  The liver, spleen, pancreas, gallbladder adrenal glands and kidneys are unremarkable except for small bilateral renal cysts.  There is no evidence of free fluid, enlarged lymph nodes, biliary dilation or abdominal aortic aneurysm.  The bowel, bladder and appendix are unremarkable. There is no evidence of bowel obstruction, abscess or pneumoperitoneum.  A small right inguinal hernia containing fat is identified.  Mild prostate enlargement is present.  No acute or suspicious bony abnormalities are identified.  IMPRESSION: No evidence of acute abnormality.  Cardiomegaly, aortic valve replacement, prostate enlargement and small right inguinal hernias containing fat.   Electronically Signed   By: Hassan Rowan M.D.   On: 09/06/2013 22:32   Ir Veno/ext/uni Right  09/14/2013   CLINICAL DATA:  Poor venous access, request for PICC  EXAM: DUAL LUMEN LEFT MIDLINE VENOUS CATHETER PLACEMENT WITH ULTRASOUND AND FLUOROSCOPIC GUIDANCE  ADDITIONAL VENOGRAPHY OF THE RIGHT UPPER EXTREMITY  FLUOROSCOPY TIME:  7 MIN AND 30 SECONDS.  PROCEDURE: The patient's family member was advised of the possible risks and complications and agreed to undergo the procedure. The patient was then brought to the angiographic suite for the procedure.  The right and left arm was prepped with chlorhexidine, draped in the usual sterile fashion using maximum barrier technique (cap and mask, sterile gown, sterile gloves, large sterile sheet, hand hygiene and cutaneous antisepsis) and infiltrated locally with 1% Lidocaine.  Ultrasound demonstrated patency of the right and left brachial vein, and this was documented with an image. Under real-time ultrasound guidance, the right brachial  vein was accessed with a 21 gauge micropuncture needle and image documentation was performed. A 0.018 wire was introduced in to the vein. Over this, a dilator introducer was placed, the wire was unable to be advanced proximally so a venogram was performed with 10 cc of Omnipaque, this revealed venous extravasation. The right brachial vein site was abandoned and manual pressure was held until the site was soft with no continued bleeding. The left brachial vein was accessed with a 21 gauge micropuncture needle and multiple guidewires used to advance to the SVC/RA junction. A 5 French dual lumen power-injectable PICC was advanced, however unable to cross proximally to the lower SVC/right atrial junction, the PICC was left midline for this reason. Fluoroscopy during the procedure and fluoro spot radiograph confirms appropriate catheter position. The catheter aspirated and flushed well and was then covered with a sterile dressing.  Complications: Extravasation of 10 cc of Omnipaque to right brachial vein. Unable to advance left brachial vein PICC to the SVC/RA junction, left brachial PICC was placed midline.  IMPRESSION: Successful left arm midline VENOUS CATHETER placement with ultrasound and fluoroscopic guidance. The catheter is ready for use.  Read By:  Tsosie Billing PA-C   Electronically Signed   By: Jenness Corner.D.  On: 09/14/2013 11:51   US Renal  09/15/2013   CLINICAL DATA:  Acute renal failure.  EXAM: RENAL/URINARY TRACT ULTRASOUND COMPLETE  COMPARISON:  CT abdomen and pelvis 09/06/2013.  FINDINGS: Right Kidney:  Length: Approximately 12.7 cm. No hydronephrosis. Well-preserved cortex. No shadowing calculi. Normal parenchymal echotexture without focal abnormalities.  Left Kidney:  Length: Approximately 12.5 cm. No hydronephrosis. Well-preserved cortex. No shadowing calculi. Normal parenchymal echotexture without focal abnormalities.  Bladder:  Decompressed by Foley catheter.  IMPRESSION: Normal kidneys.   Specifically, no evidence of hydronephrosis.   Electronically Signed   By: Evangeline Dakin M.D.   On: 09/15/2013 10:55   Ir Fluoro Guide Cv Line Left  09/14/2013   CLINICAL DATA:  Poor venous access, request for PICC  EXAM: DUAL LUMEN LEFT MIDLINE VENOUS CATHETER PLACEMENT WITH ULTRASOUND AND FLUOROSCOPIC GUIDANCE  ADDITIONAL VENOGRAPHY OF THE RIGHT UPPER EXTREMITY  FLUOROSCOPY TIME:  7 MIN AND 30 SECONDS.  PROCEDURE: The patient's family member was advised of the possible risks and complications and agreed to undergo the procedure. The patient was then brought to the angiographic suite for the procedure.  The right and left arm was prepped with chlorhexidine, draped in the usual sterile fashion using maximum barrier technique (cap and mask, sterile gown, sterile gloves, large sterile sheet, hand hygiene and cutaneous antisepsis) and infiltrated locally with 1% Lidocaine.  Ultrasound demonstrated patency of the right and left brachial vein, and this was documented with an image. Under real-time ultrasound guidance, the right brachial vein was accessed with a 21 gauge micropuncture needle and image documentation was performed. A 0.018 wire was introduced in to the vein. Over this, a dilator introducer was placed, the wire was unable to be advanced proximally so a venogram was performed with 10 cc of Omnipaque, this revealed venous extravasation. The right brachial vein site was abandoned and manual pressure was held until the site was soft with no continued bleeding. The left brachial vein was accessed with a 21 gauge micropuncture needle and multiple guidewires used to advance to the SVC/RA junction. A 5 French dual lumen power-injectable PICC was advanced, however unable to cross proximally to the lower SVC/right atrial junction, the PICC was left midline for this reason. Fluoroscopy during the procedure and fluoro spot radiograph confirms appropriate catheter position. The catheter aspirated and flushed well  and was then covered with a sterile dressing.  Complications: Extravasation of 10 cc of Omnipaque to right brachial vein. Unable to advance left brachial vein PICC to the SVC/RA junction, left brachial PICC was placed midline.  IMPRESSION: Successful left arm midline VENOUS CATHETER placement with ultrasound and fluoroscopic guidance. The catheter is ready for use.  Read By:  Tsosie Billing PA-C   Electronically Signed   By: Aletta Edouard M.D.   On: 09/14/2013 11:51   Ir Fluoro Guide Cv Line Right  09/25/2013   INDICATION: Acute renal failure. Temporary catheter needs conversion to a tunneled dialysis catheter.  EXAM: CONVERSION OF NON TUNNELED CATHETER TO A TUNNELED DIALYSIS CATHETER WITH FLUOROSCOPIC GUIDANCE  Physician: Stephan Minister. Anselm Pancoast, MD  MEDICATIONS: 3 mg versed, 150 mcg fentanyl. The patient was already on scheduled antibiotics. A radiology nurse monitored the patient for moderate sedation.  ANESTHESIA/SEDATION: Moderate sedation time: 25 minutes  FLUOROSCOPY TIME:  24 seconds  PROCEDURE: Informed consent was obtained for conversion to a tunneled dialysis catheter. The existing right jugular catheter and right side of the neck was prepped and draped in sterile fashion. Maximal barrier sterile technique was utilized  including caps, mask, sterile gowns, sterile gloves, sterile drape, hand hygiene and skin antiseptic. The skin below the right clavicle was anesthetized with 1% lidocaine. A subcutaneous tract was created and a 23 cm tip to cuff HemoSplit catheter was placed through the tract to the temporary dialysis catheter site. The temporary dialysis catheter was exchanged for a peel-away sheath over a wire. The new dialysis catheter was placed through the peel-away sheath and the tip was placed in the right atrium. Both lumens aspirated and flushed well. Appropriate amount of heparin was placed in both lumens. Gelfoam was placed within the subcutaneous tract. The vein skin site and catheter exit site were  sutured with 2-0 Ethilon sutures.  COMPLICATIONS: None  FINDINGS: Catheter tip in the right atrium.  IMPRESSION: Successful conversion of a temporary dialysis catheter to a tunneled dialysis catheter with fluoroscopy.   Electronically Signed   By: Markus Daft M.D.   On: 09/25/2013 16:25   Ir Fluoro Guide Cv Line Right  09/21/2013   CLINICAL DATA:  Renal failure  EXAM: IR RIGHT FLOURO GUIDE CV LINE; IR ULTRASOUND GUIDANCE VASC ACCESS RIGHT  MEDICATIONS AND MEDICAL HISTORY: None  ANESTHESIA/SEDATION: None  CONTRAST:  None  FLUOROSCOPY TIME:  18 seconds.  PROCEDURE: The procedure, risks, benefits, and alternatives were explained to the patient. Questions regarding the procedure were encouraged and answered. The patient understands and consents to the procedure.  The right neck was prepped with Betadine in a sterile fashion, and a sterile drape was applied covering the operative field. A sterile gown and sterile gloves were used for the procedure.  Under sonographic guidance, a micropuncture needle was inserted into the right internal jugular vein and removed over a 018 wire which was up sized to a 3 J. the 20 cm temporary dialysis catheter was advanced over the wire to the right atrium. It was flushed and sewn in place.  COMPLICATIONS: None  FINDINGS: Tip of the temporary dialysis catheter is at the right atrium  IMPRESSION: Successful placement of a temporary right internal jugular vein dialysis catheter.   Electronically Signed   By: Maryclare Bean M.D.   On: 09/21/2013 12:15   Ir US Guide Vasc Access Left  09/14/2013   CLINICAL DATA:  Poor venous access, request for PICC  EXAM: DUAL LUMEN LEFT MIDLINE VENOUS CATHETER PLACEMENT WITH ULTRASOUND AND FLUOROSCOPIC GUIDANCE  ADDITIONAL VENOGRAPHY OF THE RIGHT UPPER EXTREMITY  FLUOROSCOPY TIME:  7 MIN AND 30 SECONDS.  PROCEDURE: The patient's family member was advised of the possible risks and complications and agreed to undergo the procedure. The patient was then brought  to the angiographic suite for the procedure.  The right and left arm was prepped with chlorhexidine, draped in the usual sterile fashion using maximum barrier technique (cap and mask, sterile gown, sterile gloves, large sterile sheet, hand hygiene and cutaneous antisepsis) and infiltrated locally with 1% Lidocaine.  Ultrasound demonstrated patency of the right and left brachial vein, and this was documented with an image. Under real-time ultrasound guidance, the right brachial vein was accessed with a 21 gauge micropuncture needle and image documentation was performed. A 0.018 wire was introduced in to the vein. Over this, a dilator introducer was placed, the wire was unable to be advanced proximally so a venogram was performed with 10 cc of Omnipaque, this revealed venous extravasation. The right brachial vein site was abandoned and manual pressure was held until the site was soft with no continued bleeding. The left brachial vein was accessed  with a 21 gauge micropuncture needle and multiple guidewires used to advance to the SVC/RA junction. A 5 French dual lumen power-injectable PICC was advanced, however unable to cross proximally to the lower SVC/right atrial junction, the PICC was left midline for this reason. Fluoroscopy during the procedure and fluoro spot radiograph confirms appropriate catheter position. The catheter aspirated and flushed well and was then covered with a sterile dressing.  Complications: Extravasation of 10 cc of Omnipaque to right brachial vein. Unable to advance left brachial vein PICC to the SVC/RA junction, left brachial PICC was placed midline.  IMPRESSION: Successful left arm midline VENOUS CATHETER placement with ultrasound and fluoroscopic guidance. The catheter is ready for use.  Read By:  Tsosie Billing PA-C   Electronically Signed   By: Aletta Edouard M.D.   On: 09/14/2013 11:51   Ir US Guide Vasc Access Right  09/21/2013   CLINICAL DATA:  Renal failure  EXAM: IR RIGHT FLOURO  GUIDE CV LINE; IR ULTRASOUND GUIDANCE VASC ACCESS RIGHT  MEDICATIONS AND MEDICAL HISTORY: None  ANESTHESIA/SEDATION: None  CONTRAST:  None  FLUOROSCOPY TIME:  18 seconds.  PROCEDURE: The procedure, risks, benefits, and alternatives were explained to the patient. Questions regarding the procedure were encouraged and answered. The patient understands and consents to the procedure.  The right neck was prepped with Betadine in a sterile fashion, and a sterile drape was applied covering the operative field. A sterile gown and sterile gloves were used for the procedure.  Under sonographic guidance, a micropuncture needle was inserted into the right internal jugular vein and removed over a 018 wire which was up sized to a 3 J. the 20 cm temporary dialysis catheter was advanced over the wire to the right atrium. It was flushed and sewn in place.  COMPLICATIONS: None  FINDINGS: Tip of the temporary dialysis catheter is at the right atrium  IMPRESSION: Successful placement of a temporary right internal jugular vein dialysis catheter.   Electronically Signed   By: Maryclare Bean M.D.   On: 09/21/2013 12:15   Dg Chest Port 1 View  09/18/2013   CLINICAL DATA:  Respiratory distress, shortness of breath.  EXAM: PORTABLE CHEST - 1 VIEW  COMPARISON:  09/17/2013  FINDINGS: Prior CABG and valve replacement. Left pass catheterization is in stable position. Cardiomegaly. Left lower lobe opacity, likely atelectasis. No effusions or overt edema.  IMPRESSION: Cardiomegaly.  Left lower lobe atelectasis.   Electronically Signed   By: Rolm Baptise M.D.   On: 09/18/2013 06:27   Dg Chest Port 1 View  09/17/2013   CLINICAL DATA:  Hemodialysis catheter placement  EXAM: PORTABLE CHEST - 1 VIEW  COMPARISON:  DG CHEST 1V PORT dated 09/17/2013; DG CHEST 1V PORT dated 09/13/2013; DG CHEST 1V PORT dated 09/08/2013  FINDINGS: Grossly unchanged enlarged cardiac silhouette and mediastinal contours post median sternotomy and valve replacement. Interval  placement of left jugular approach dialysis catheter with tip projected over the superior aspect of the SVC. Lung volumes remain reduced. Pulmonary vasculature remains indistinct. Grossly unchanged perihilar and bibasilar heterogeneous / consolidative opacities, left greater than right. No definite pleural effusion or pneumothorax. Grossly unchanged bones including postsurgical/traumatic deformity of the distal end of the right clavicle.  IMPRESSION: 1. Interval placement of left jugular approach central venous catheter with tip projected over the superior SVC. No pneumothorax. 2. Similar findings of pulmonary edema and perihilar/bibasilar opacities, left greater than right, atelectasis versus infiltrate.   Electronically Signed   By: Eldridge Abrahams.D.  On: 09/17/2013 10:41   Dg Chest Port 1 View  09/17/2013   CLINICAL DATA:  Confirm PICC line placement.  EXAM: PORTABLE CHEST - 1 VIEW  COMPARISON:  09/13/2013  FINDINGS: No PICC line is visualized within the chest or shoulder regions bilaterally.  There is cardiomegaly. Prior median sternotomy and valve replacement. Mild vascular congestion. Left lower lobe airspace opacity again noted, unchanged. No visible effusions.  IMPRESSION: No visible PICC line.  Stable cardiomegaly and vascular congestion.  Stable left lower lobe atelectasis or consolidation/pneumonia.   Electronically Signed   By: Rolm Baptise M.D.   On: 09/17/2013 04:08   Dg Chest Port 1 View  09/13/2013   CLINICAL DATA:  Respiratory distress  EXAM: PORTABLE CHEST - 1 VIEW  COMPARISON:  DG CHEST 1V PORT dated 09/08/2013  FINDINGS: Evidence of CABG reidentified with moderate enlargement of the cardiac silhouette and central vascular congestion. Retrocardiac opacity persists. No overt alveolar edema or other filling processes otherwise identified. Trace if any left pleural fluid. No acute osseous finding.  IMPRESSION: Cardiomegaly with central vascular congestion and persistent retrocardiac atelectasis  or possibly early pneumonia.   Electronically Signed   By: Conchita Paris M.D.   On: 09/13/2013 13:06   Dg Chest Port 1 View  09/08/2013   CLINICAL DATA:  Chest pain, hypertension  EXAM: PORTABLE CHEST - 1 VIEW  COMPARISON:  September 06, 2013  FINDINGS: The heart size and mediastinal contours are stable. Patient is status post prior CABG. The heart size is enlarged. Patchy consolidation of left lung base is identified. There is mild atelectasis of right lung base. There is no pulmonary edema. There is probable small left pleural effusion. The visualized skeletal structures are stable.  IMPRESSION: Early pneumonia of left lung base with small left pleural effusion.   Electronically Signed   By: Abelardo Diesel M.D.   On: 09/08/2013 23:41   Dg Abd Portable 2v  09/18/2013   CLINICAL DATA:  58 year old male with abdominal distension. Possible small bowel obstruction. Initial encounter.  EXAM: PORTABLE ABDOMEN - 2 VIEW  COMPARISON:  09/17/2013 and earlier.  FINDINGS: Supine and portable cross-table lateral views of the abdomen. Decreased gaseous distension of small bowel. Numerous gas-filled mostly nondilated loops persist. Persistent right colon gas. No pneumoperitoneum identified. Mild motion artifact on both views. Stable visualized osseous structures.  IMPRESSION: Improved bowel gas pattern with decreased gaseous distension of small bowel and persistent colon gas. Pattern could reflect improving small bowel obstruction or ileus.  No free air identified.   Electronically Signed   By: Lars Pinks M.D.   On: 09/18/2013 17:50         Subjective:   Objective: Filed Vitals:   10/06/13 1230 10/06/13 1238 10/06/13 1359 10/06/13 1659  BP: 135/90 140/87 118/76 122/73  Pulse: 105 104 110 108  Temp:  98.4 F (36.9 C) 100.4 F (38 C) 99.1 F (37.3 C)  TempSrc:  Oral Oral Oral  Resp:  20 20 20   Height:      Weight:  90 kg (198 lb 6.6 oz)    SpO2:  99% 100% 97%    Intake/Output Summary (Last 24 hours) at  10/06/13 1921 Last data filed at 10/06/13 1723  Gross per 24 hour  Intake    820 ml  Output   2400 ml  Net  -1580 ml   Weight change: 1.497 kg (3 lb 4.8 oz) Exam:   General:  Pt is alert, follows commands appropriately, not in acute distress  HEENT: No icterus, No thrush, No neck mass, [place]/AT  Cardiovascular: RRR, S1/S2, no rubs, no gallops  Respiratory: CTA bilaterally, no wheezing, no crackles, no rhonchi  Abdomen: Soft/+BS, non tender, non distended, no guarding  Extremities: No edema, No lymphangitis, No petechiae, No rashes, no synovitis  Data Reviewed: Basic Metabolic Panel:  Recent Labs Lab 09/30/13 0443 10/01/13 0540 10/05/13 0520  NA 137 137 133*  K 3.8 4.0 4.2  CL 97 96 93*  CO2 24 25 25   GLUCOSE 91 106* 105*  BUN 14 21 18   CREATININE 5.19* 7.21* 7.61*  CALCIUM 8.2* 8.2* 8.4   Liver Function Tests: No results found for this basename: AST, ALT, ALKPHOS, BILITOT, PROT, ALBUMIN,  in the last 168 hours No results found for this basename: LIPASE, AMYLASE,  in the last 168 hours No results found for this basename: AMMONIA,  in the last 168 hours CBC:  Recent Labs Lab 09/30/13 0443 10/05/13 0520  WBC 14.2* 14.8*  HGB 7.9* 7.9*  HCT 22.7* 22.9*  MCV 86.6 88.4  PLT 321 365   Cardiac Enzymes: No results found for this basename: CKTOTAL, CKMB, CKMBINDEX, TROPONINI,  in the last 168 hours BNP: No components found with this basename: POCBNP,  CBG:  Recent Labs Lab 10/01/13 1736 10/01/13 1816 10/02/13 0801 10/02/13 1219 10/02/13 1627  GLUCAP 146* 132* 105* 102* 105*    Recent Results (from the past 240 hour(s))  CLOSTRIDIUM DIFFICILE BY PCR     Status: None   Collection Time    09/27/13  5:07 PM      Result Value Ref Range Status   C difficile by pcr NEGATIVE  NEGATIVE Final  CLOSTRIDIUM DIFFICILE BY PCR     Status: None   Collection Time    10/06/13  4:49 AM      Result Value Ref Range Status   C difficile by pcr NEGATIVE  NEGATIVE Final      Scheduled Meds: . antiseptic oral rinse  15 mL Mouth Rinse q12n4p  . aspirin EC  81 mg Oral Daily  . darbepoetin (ARANESP) injection - DIALYSIS  60 mcg Intravenous Q Fri-HD  . feeding supplement (RESOURCE BREEZE)  1 Container Oral BID BM  . ferric gluconate (FERRLECIT/NULECIT) IV  125 mg Intravenous Weekly  . heparin subcutaneous  5,000 Units Subcutaneous 3 times per day  . levothyroxine  200 mcg Oral QAC breakfast  . morphine      . multivitamin  1 tablet Oral QHS  . nafcillin IV  2 g Intravenous Q4H  . pantoprazole  40 mg Oral Q1200  . rifampin  600 mg Oral Daily  . sevelamer carbonate  1.6 g Oral TID WC  . sodium chloride  3 mL Intravenous Q12H   Continuous Infusions:    Shaleigh Laubscher, DO  [facility] Pager [phone]  If 7PM-7AM, please contact night-coverage [email or web address] Password [email or web address] 10/06/2013, 7:21 PM   LOS: 28 days

## 2013-10-07 MED ORDER — CEFAZOLIN SODIUM 1-5 GM-% IV SOLN: 1.0000 g | INTRAVENOUS | Status: DC

## 2013-10-07 MED ORDER — CEFAZOLIN SODIUM-DEXTROSE 2-3 GM-% IV SOLR
2.0000 g | INTRAVENOUS | Status: AC
  Filled 2013-10-07: qty 50

## 2013-10-07 MED ORDER — CEFUROXIME SODIUM 1.5 G IJ SOLR
1.5000 g | INTRAMUSCULAR | Status: AC
  Administered 2013-10-08: 1.5 g via INTRAVENOUS
  Filled 2013-10-07: qty 1.5

## 2013-10-07 NOTE — Progress Notes (Signed)
Occupational Therapy Treatment Patient Details Name: Bradley [name] MRN: [id number] DOB: 03-May-1956 Today's Date: 10/07/2013 Time: [id number] OT Time Calculation (min): 25 min  OT Assessment / Plan / Recommendation  History of present illness Pt admit with sepsis and r/o endocarditis.  Anoxic brain injury.   OT comments  Agreeable to eob for grooming tasks and hair washing.  Able to sequence steps of oral care with minimal guidance.  Required more cueing for washing hair but tolerated un supported sitting with no noted LOB.  Dtr. Present and was very active in tx. Session states her and her brother are available to assist pt.   Follow Up Recommendations  CIR;Supervision/Assistance - 24 hour                  Recommendations for Other Services Rehab consult  Frequency Min 2X/week   Progress towards OT Goals Progress towards OT goals: Progressing toward goals  Plan Discharge plan remains appropriate    Precautions / Restrictions Precautions Precautions: Fall   Pertinent Vitals/Pain No c/o pain    ADL  Grooming: Performed;Wash/dry hands;Wash/dry face;Teeth care;Brushing hair Where Assessed - Grooming: Unsupported sitting ADL Comments: sat eob approx. 20 min. to complete grooming tasks and washing hair.  required intermitent one step commands when noticable sequencing deficts presented, otherwise pt. initiated proper sequencing of teeth bruhsing and rinsing mouth.  one step directions required for washing hair as pt. was unfamiliar with shower cap.     OT Goals(current goals can now be found in the care plan section) Acute Rehab OT Goals Patient Stated Goal: To get stronger  Visit Information  Last OT Received On: 10/07/13 Assistance Needed: +1 History of Present Illness: Pt admit with sepsis and r/o endocarditis.  Anoxic brain injury.    Subjective Data   "washing my hair would be nice"          Cognition  Cognition Arousal/Alertness: Awake/alert Behavior During  Therapy: WFL for tasks assessed/performed Overall Cognitive Status: Impaired/Different from baseline Area of Impairment: Memory;Problem solving Current Attention Level: Selective Memory: Decreased short-term memory Following Commands: Follows multi-step commands with increased time Safety/Judgement: Decreased awareness of safety Awareness: Intellectual Problem Solving: Slow processing;Decreased initiation;Requires verbal cues General Comments: Delayed processing time      Mobility  Bed Mobility Overal bed mobility: Needs Assistance Bed Mobility: Supine to Sit Rolling: Min assist Supine to sit: Min assist;HOB elevated Sit to supine: Min guard General bed mobility comments: dtr. present and assisted pt. with bed mobility Transfers Overall transfer level: Needs assistance Equipment used: Rolling walker (2 wheeled) Transfers: Sit to/from Stand Sit to Stand: Min assist General transfer comment: increased time for completion of task    Exercises  General Exercises - Upper Extremity Shoulder Flexion: AROM;Both;5 reps;Seated General Exercises - Lower Extremity Hip Flexion/Marching: AROM;Both;5 reps;Standing Heel Raises: AROM;Both;5 reps;Standing Mini-Sqauts: AROM;5 reps;Standing Other Exercises Other Exercises: seated rows x 5 reps with cues; visual demo   Balance Balance Standing balance support: Bilateral upper extremity supported Standing balance-Leahy Scale: Poor Standing balance comment: UE assist needed for safety  End of Session OT - End of Session Activity Tolerance: Patient tolerated treatment well Patient left: in bed;with family/visitor present;Other (comment) (sitting eob with dtr. assisting with nail care)       Janice Coffin, COTA/L 10/07/2013, 4:27 PM

## 2013-10-07 NOTE — Progress Notes (Signed)
Rehab admissions - Patient is making good progress.  He is now requiring minguard assist to ambulate 110 ft.  May need SNF or home with [facility] with current progress.  Call me for questions.  [phone]

## 2013-10-07 NOTE — Progress Notes (Signed)
[facility]  Patient is pre-op for AV fistula verses graft tomorrow.  Amun Stemm MAUREEN PA-C

## 2013-10-07 NOTE — Progress Notes (Signed)
S: No new CO.  Remains very anxious with some lability of emotions O:BP 104/73  Pulse 81  Temp(Src) 98.7 F (37.1 C) (Oral)  Resp 16  Ht 6\' 1"  (1.854 m)  Wt 90 kg (198 lb 6.6 oz)  BMI 26.18 kg/m2  SpO2 97%  Intake/Output Summary (Last 24 hours) at 10/07/13 0906 Last data filed at 10/07/13 0636  Gross per 24 hour  Intake    980 ml  Output   2400 ml  Net  -1420 ml   Weight change: -0.86 kg (-1 lb 14.3 oz) [id number] and alert CVS:RRR Resp:clear Abd:+ BS NTND Ext:no edema NEURO:CNI Ox2 no asterixis Rt Permcath   . antiseptic oral rinse  15 mL Mouth Rinse q12n4p  . aspirin EC  81 mg Oral Daily  . [START ON 10/08/2013] cefUROXime (ZINACEF)  IV  1.5 g Intravenous On Call to OR  . darbepoetin (ARANESP) injection - DIALYSIS  60 mcg Intravenous Q Fri-HD  . feeding supplement (RESOURCE BREEZE)  1 Container Oral BID BM  . ferric gluconate (FERRLECIT/NULECIT) IV  125 mg Intravenous Weekly  . heparin subcutaneous  5,000 Units Subcutaneous 3 times per day  . levothyroxine  200 mcg Oral QAC breakfast  . multivitamin  1 tablet Oral QHS  . nafcillin IV  2 g Intravenous Q4H  . pantoprazole  40 mg Oral Q1200  . rifampin  600 mg Oral Daily  . sevelamer carbonate  1.6 g Oral TID WC  . sodium chloride  3 mL Intravenous Q12H   No results found. BMET    Component Value Date/Time   NA 133* 10/05/2013 0520   K 4.2 10/05/2013 0520   CL 93* 10/05/2013 0520   CO2 25 10/05/2013 0520   GLUCOSE 105* 10/05/2013 0520   BUN 18 10/05/2013 0520   CREATININE 7.61* 10/05/2013 0520   CALCIUM 8.4 10/05/2013 0520   GFRNONAA 7* 10/05/2013 0520   GFRAA 8* 10/05/2013 0520   CBC    Component Value Date/Time   WBC 14.8* 10/05/2013 0520   RBC 2.59* 10/05/2013 0520   HGB 7.9* 10/05/2013 0520   HCT 22.9* 10/05/2013 0520   PLT 365 10/05/2013 0520   MCV 88.4 10/05/2013 0520   MCH 30.5 10/05/2013 0520   MCHC 34.5 10/05/2013 0520   RDW 19.4* 10/05/2013 0520   LYMPHSABS 0.8 09/19/2013 0455   MONOABS 0.5 09/19/2013 0455   EOSABS 0.1  09/19/2013 0455   BASOSABS 0.0 09/19/2013 0455     Assessment: 1. ESRD 2. MSSA bacteremia, ? Endocarditis. On nafcillin 3. HTN 4. Anemia, on aranesp  Plan: 1. Plan HD in AM 2. Check PTH 3. To get access placed in AM   Bradley [name] T

## 2013-10-07 NOTE — Progress Notes (Signed)
[facility] PROGRESS NOTE  Bradley [name] [id number] DOB: 02/05/1956 DOA: 09/08/2013 PCP: [name], MD  58 year old male with history of aortic stenosis, status post pediatric aortic valvulotomy at age 89 followed by tissue AVR (Bentall procedure) 02/2000 with cardiac cath 02/2010 with normal coronaries, chronic LBBB, thyroid cancer post thyroidectomy >> hypothyroidism, HTN, and depression, who had been generally unwell for 2-3 weeks. He was seen in the ED on 09/06/13 and assessed as influenza related vomiting, diarrhea, abdominal pain, body aches, fever and mild confusion and was ultimately discharged on Tamiflu. Patient worsened at home with progressive confusion/disorientation and was admitted with sepsis, MSSA bacteremia septic brain emboli and presumptive infective endocarditis. The patient was previously followed by infectious disease. The patient remains on nafcillin and rifampin with a stop date of 10/21/2013. A hemodialysis catheter was placed 05/20/2014 and hemodialysis was initiated. The patient has remained clinically stable. He is due to have AVF placement on 10/08/2013. He remains deconditioned and will ultimately need a skilled nursing facility. CIR had previously been consulted, but it does not appear that he will be a good candidate.   Assessment/Plan: Severe sepsis secondary to MSSA bacteremia w/ sepsis / infective endocarditis w/ cerebral septic emboli / UTI  -09/08/13 Blood cultures x 2 MSSA - follow up cultures negative  -09/08/13 UA cx with MSSA UTI  -TTE (09/09/13) without vegetation  -Cardiology/ID recommend 6 weeks of antibiotics for presumed endocarditis through October 21, 2013, no TEE at this time  -has prior tissue AVR  -ID directing antibiotics, currently on naficillin and rifampin  -Patient continues to have low grade fever, however, leukocytosis trending downward  -Cdiff PCR negative on 2/16 and 09/16/13, 09/27/13, and 10/06/13 -Blood cultures from 09/10/13,  09/24/13, show no growth to date  Toxic metabolic encephalopathy  -gradually improving but has intermittent episodes of impulsivity, confusion  -secondary to severe sepsis, azotemia, and septic brain emboli  -MRI Brain (09/09/13) with multiple bilateral supra tentorial and right cerebellar regions consistent with acute partially hemorrhagic infarcts suspected secondary to septic emboli  -Continue supportive care and treat underlying causes  -Neurology has evaluated and has signed off -QTc prolonged so haldol not safe  [id number], folic acid [id number], and ammonia 17  Severe thrombocytopenia  -improved with tx of infectious process  -Resolved, Platelet counts dropped to a nadir of 23  -felt to be due to sepsis  Acute Renal Failure/Acute tubular necrosis-->ESRD  -due to bacteremia/immune complex or gentamicin  -baseline renal fnx: 18 / 0.93  -progressive failure since admit  -Transferred to [name] 09/15/2013 for hemodialysis  -Hemodialysis started 09/17/2013  -Renal US normal kidneys, no hydronephrosis  -appreciate renal followup  -AVF/graft planned  10/08/13  Chronic Abdominal pain/diarrhea  -distention improved  -diarrhea likely due to antibiotics,  -C diff negative 2/16 and 2/17, 2/28  NSTEMI (Type II)  -peak Tropinin 2.26  -Cardiology has no plans for cath at this point  -has stable ST elevation r/t LBBB  Acute Systolic HF and Chronic grade 1 Diastolic CHF  -ECHO Oct [id number] with EF 60-65% with severe LVH and no RWMA  -ECHO Sep 09, 2013 EF 45-50% with septal and apical hypokinesis  History of thyroid cancer, post thyroidectomy hypothyroidism  -TSH elevated at 10.174  -Synthroid increased  -Will need monitoring and repeat labs in 4-6 weeks  Hypertension  -Holding lisinopril due to acute renal failure  -Continue metoprolol IV PRN  -remains controlled  Hyperglycemia  -Hemoglobin A1c 5.7  -d/c CBG monitoring and ISS  History of  depression  -hold Zoloft secondary to acute  encephalopathy.  Anxiety/Insomnia  -Will discontinue ambien  -Continue Klonopin QHS PRN, and Ativan daily PRN  -Continue to monitor  Hyponatremia  -Resolved, due to volume overload  -corrected after initiation of hemodialysis  Deconditioning  -CIR vs SNF Code Status: Full  Procedures/Course  2/09 - admit  2/10 - ID, Heme and Cards consults. TTE EF 45%. No clear vegetations  2/14 - PCCM consult  2/15 - Junctional rhythm with precedex, mental status improved. Transferred to [facility] for possible HD  2/16 - Renal U/S- normal kidneys, no hydronephrosis  2/18 - transferred to ICU for diaphoresis, ST changes and confusion  2/21 - patient pulled out L IJ HD catheter that was placed on 09/17/13  2/21 - L [place] HD catheter placed  2/26 - Conversion of R IJ temp cath to [facility] cath  [place]  Nephrology  PCCM  Infectious disease  Interventional radiology  Cardiology  Neurology  Hematology  CIR  DVT Prophylaxis Heparin  Family Communication: wife and daughter at beside  Disposition Plan: CIR          Procedures/Studies: Dg Abd 1 View  09/17/2013   CLINICAL DATA:  Abdominal distension  EXAM: ABDOMEN - 1 VIEW  COMPARISON:  CT ABD/PELVIS W CM dated 09/06/2013  FINDINGS: There is at least moderate gas distention of multiple loops of predominantly small bowel, though there may be minimal gaseous distention of the splenic flexure of the colon.  Examination is degraded secondary to the coned field-of-view on solitary provided radiograph.  Nondiagnostic evaluation for pneumoperitoneum secondary supine positioning and exclusion of the lower thorax. No definite pneumatosis or portal venous gas.  No definite abnormal intra-abdominal calcifications.  Degenerative change the lower lumbar spine.  IMPRESSION: Degraded examination with findings concerning for small bowel obstruction though there may be a minimal amount of air within the splenic flexure of the colon. Clinical correlation is advised. Further  evaluation with complete abdominal radiographic series may be performed as clinically indicated.   Electronically Signed   By: Sandi Mariscal M.D.   On: 09/17/2013 10:38   Ct Head Wo Contrast  09/12/2013   CLINICAL DATA:  Increased mental status change, evaluate for CVA versus worsening bleed.  EXAM: CT HEAD WITHOUT CONTRAST  TECHNIQUE: Contiguous axial images were obtained from the base of the skull through the vertex without intravenous contrast.  COMPARISON:  Prior MRI and and CT from 09/09/2013.  FINDINGS: Study is degraded by motion artifact.  Vasogenic edema as trending linear hyperdensity within the left parietal lobe is stable not significantly changed relative to the previous examination. Vague hypodensities involving the supratentorial brain and right cerebellar hemisphere are not significantly changed relative to recent MRI. No definite a new intracranial infarct identified. Previously seen hyperdense foci within the mid bilateral frontal lobes are less conspicuous as compared to prior. No new intracranial hemorrhage. No mass lesion or midline shift. Ventricles are stable in size without evidence of hydrocephalus. No extra-axial fluid collection.  The calvarium is grossly intact. Orbits are normal. Paranasal sinuses and mastoid air cells remain clear.  IMPRESSION: 1. Limited study due to motion. No significant interval change in subacute left parietal infarct with small amount of associated hemorrhage. Additional multi focal infarcts involving the supratentorial brain and right cerebellar hemisphere are not significantly changed as well relative to recent MRI from 09/09/2013. No definite new intracranial infarct identified. No new intracranial hemorrhage.   Electronically Signed   By: Pincus Badder.D.  On: 09/12/2013 22:02   Ct Head Wo Contrast  09/09/2013   CLINICAL DATA:  Fever.  Altered mental status.  EXAM: CT HEAD WITHOUT CONTRAST  TECHNIQUE: Contiguous axial images were obtained from  the base of the skull through the vertex without intravenous contrast.  COMPARISON:  CT HEAD W/O CM dated 09/06/2013; DG CHEST 1V PORT dated 09/08/2013; CT ABD/PELVIS W CM dated 09/06/2013  FINDINGS: Small focus of linear high attenuation in the left parietal lobe appear similar to the prior exam. The amount of edema at has increased compared to the prior exam suggesting a subacute process. This appears wedge-shaped in may represent an embolic infarct. There is a similar faint area of high attenuation in the left frontal lobe on image number 22 low with a more subtle area in the right frontal lobe that is more equivocal. The dural venous sinuses appear within normal limits. No mass lesion or midline shift. No hydrocephalus.  The calvarium is intact. Left-sided nasal septal spur. Mastoid air cells clear. Paranasal sinuses are normal.  IMPRESSION: 1. Increasing edema around linear high attenuation in the left parietal lobe. Followup MRI with and without contrast is recommended for further assessment. The patient presents with an infectious syndrome and septic emboli are in the differential considerations. Infarct with a small amount of hemorrhage or cortical laminar necrosis as well as developmental venous anomaly remain in the differential considerations. MRI should be useful in further characterization. 2. Dr. Leonel Ramsay called discuss the case at 0230 hr on the day of examination.   Electronically Signed   By: Dereck Ligas M.D.   On: 09/09/2013 02:32   [name] [id number] Contrast  09/09/2013   CLINICAL DATA:  Post aortic valve replacement 2011. Presenting with altered mental status, elevated white count and abnormal CT.  EXAM: MRI HEAD WITHOUT AND WITH CONTRAST  TECHNIQUE: Multiplanar, multiecho pulse sequences of the brain and surrounding structures were obtained without and with intravenous contrast.  CONTRAST:  67mL MULTIHANCE GADOBENATE DIMEGLUMINE 529 MG/ML IV SOLN  COMPARISON:  09/09/2013 and 09/06/2013 CT.  No  comparison MR.  FINDINGS: Exam is motion degraded.  Multiple bilateral supra tentorial and right cerebellar regions of restricted motion some of which are partially hemorrhagic with largest area left parietal lobe. In the present clinical setting, these findings are consistent with acute partially hemorrhagic infarcts and possibly related to septic emboli.  At most there is very minimal enhancement of the left parietal partially hemorrhagic infarct.  No intracranial mass lesion seen separate from the above described findings.  Major intracranial vascular structures are patent.  IMPRESSION: Exam is motion degraded.  Multiple bilateral supra tentorial and right cerebellar regions of restricted motion some of which are partially hemorrhagic with largest area left parietal lobe. In the present clinical setting, these findings are consistent with acute partially hemorrhagic infarcts and possibly related to septic emboli.  These results were called by telephone at the time of interpretation on 09/09/2013 at 11:54 AM to Dr. Algis Liming , who verbally acknowledged these results.   Electronically Signed   By: Chauncey Cruel M.D.   On: 09/09/2013 12:03   Ir Veno/ext/uni Right  09/14/2013   CLINICAL DATA:  Poor venous access, request for PICC  EXAM: DUAL LUMEN LEFT MIDLINE VENOUS CATHETER PLACEMENT WITH ULTRASOUND AND FLUOROSCOPIC GUIDANCE  ADDITIONAL VENOGRAPHY OF THE RIGHT UPPER EXTREMITY  FLUOROSCOPY TIME:  7 MIN AND 30 SECONDS.  PROCEDURE: The patient's family member was advised of the possible risks and complications and agreed to  undergo the procedure. The patient was then brought to the angiographic suite for the procedure.  The right and left arm was prepped with chlorhexidine, draped in the usual sterile fashion using maximum barrier technique (cap and mask, sterile gown, sterile gloves, large sterile sheet, hand hygiene and cutaneous antisepsis) and infiltrated locally with 1% Lidocaine.  Ultrasound demonstrated patency  of the right and left brachial vein, and this was documented with an image. Under real-time ultrasound guidance, the right brachial vein was accessed with a 21 gauge micropuncture needle and image documentation was performed. A 0.018 wire was introduced in to the vein. Over this, a dilator introducer was placed, the wire was unable to be advanced proximally so a venogram was performed with 10 cc of Omnipaque, this revealed venous extravasation. The right brachial vein site was abandoned and manual pressure was held until the site was soft with no continued bleeding. The left brachial vein was accessed with a 21 gauge micropuncture needle and multiple guidewires used to advance to the SVC/RA junction. A 5 French dual lumen power-injectable PICC was advanced, however unable to cross proximally to the lower SVC/right atrial junction, the PICC was left midline for this reason. Fluoroscopy during the procedure and fluoro spot radiograph confirms appropriate catheter position. The catheter aspirated and flushed well and was then covered with a sterile dressing.  Complications: Extravasation of 10 cc of Omnipaque to right brachial vein. Unable to advance left brachial vein PICC to the SVC/RA junction, left brachial PICC was placed midline.  IMPRESSION: Successful left arm midline VENOUS CATHETER placement with ultrasound and fluoroscopic guidance. The catheter is ready for use.  Read By:  Tsosie Billing PA-C   Electronically Signed   By: Aletta Edouard M.D.   On: 09/14/2013 11:51   US Renal  09/15/2013   CLINICAL DATA:  Acute renal failure.  EXAM: RENAL/URINARY TRACT ULTRASOUND COMPLETE  COMPARISON:  CT abdomen and pelvis 09/06/2013.  FINDINGS: Right Kidney:  Length: Approximately 12.7 cm. No hydronephrosis. Well-preserved cortex. No shadowing calculi. Normal parenchymal echotexture without focal abnormalities.  Left Kidney:  Length: Approximately 12.5 cm. No hydronephrosis. Well-preserved cortex. No shadowing calculi.  Normal parenchymal echotexture without focal abnormalities.  Bladder:  Decompressed by Foley catheter.  IMPRESSION: Normal kidneys.  Specifically, no evidence of hydronephrosis.   Electronically Signed   By: Evangeline Dakin M.D.   On: 09/15/2013 10:55   Ir Fluoro Guide Cv Line Left  09/14/2013   CLINICAL DATA:  Poor venous access, request for PICC  EXAM: DUAL LUMEN LEFT MIDLINE VENOUS CATHETER PLACEMENT WITH ULTRASOUND AND FLUOROSCOPIC GUIDANCE  ADDITIONAL VENOGRAPHY OF THE RIGHT UPPER EXTREMITY  FLUOROSCOPY TIME:  7 MIN AND 30 SECONDS.  PROCEDURE: The patient's family member was advised of the possible risks and complications and agreed to undergo the procedure. The patient was then brought to the angiographic suite for the procedure.  The right and left arm was prepped with chlorhexidine, draped in the usual sterile fashion using maximum barrier technique (cap and mask, sterile gown, sterile gloves, large sterile sheet, hand hygiene and cutaneous antisepsis) and infiltrated locally with 1% Lidocaine.  Ultrasound demonstrated patency of the right and left brachial vein, and this was documented with an image. Under real-time ultrasound guidance, the right brachial vein was accessed with a 21 gauge micropuncture needle and image documentation was performed. A 0.018 wire was introduced in to the vein. Over this, a dilator introducer was placed, the wire was unable to be advanced proximally so a venogram was  performed with 10 cc of Omnipaque, this revealed venous extravasation. The right brachial vein site was abandoned and manual pressure was held until the site was soft with no continued bleeding. The left brachial vein was accessed with a 21 gauge micropuncture needle and multiple guidewires used to advance to the SVC/RA junction. A 5 French dual lumen power-injectable PICC was advanced, however unable to cross proximally to the lower SVC/right atrial junction, the PICC was left midline for this reason.  Fluoroscopy during the procedure and fluoro spot radiograph confirms appropriate catheter position. The catheter aspirated and flushed well and was then covered with a sterile dressing.  Complications: Extravasation of 10 cc of Omnipaque to right brachial vein. Unable to advance left brachial vein PICC to the SVC/RA junction, left brachial PICC was placed midline.  IMPRESSION: Successful left arm midline VENOUS CATHETER placement with ultrasound and fluoroscopic guidance. The catheter is ready for use.  Read By:  Tsosie Billing PA-C   Electronically Signed   By: Aletta Edouard M.D.   On: 09/14/2013 11:51   Ir Fluoro Guide Cv Line Right  09/25/2013   INDICATION: Acute renal failure. Temporary catheter needs conversion to a tunneled dialysis catheter.  EXAM: CONVERSION OF NON TUNNELED CATHETER TO A TUNNELED DIALYSIS CATHETER WITH FLUOROSCOPIC GUIDANCE  Physician: Stephan Minister. Anselm Pancoast, MD  MEDICATIONS: 3 mg versed, 150 mcg fentanyl. The patient was already on scheduled antibiotics. A radiology nurse monitored the patient for moderate sedation.  ANESTHESIA/SEDATION: Moderate sedation time: 25 minutes  FLUOROSCOPY TIME:  24 seconds  PROCEDURE: Informed consent was obtained for conversion to a tunneled dialysis catheter. The existing right jugular catheter and right side of the neck was prepped and draped in sterile fashion. Maximal barrier sterile technique was utilized including caps, mask, sterile gowns, sterile gloves, sterile drape, hand hygiene and skin antiseptic. The skin below the right clavicle was anesthetized with 1% lidocaine. A subcutaneous tract was created and a 23 cm tip to cuff HemoSplit catheter was placed through the tract to the temporary dialysis catheter site. The temporary dialysis catheter was exchanged for a peel-away sheath over a wire. The new dialysis catheter was placed through the peel-away sheath and the tip was placed in the right atrium. Both lumens aspirated and flushed well. Appropriate  amount of heparin was placed in both lumens. Gelfoam was placed within the subcutaneous tract. The vein skin site and catheter exit site were sutured with 2-0 Ethilon sutures.  COMPLICATIONS: None  FINDINGS: Catheter tip in the right atrium.  IMPRESSION: Successful conversion of a temporary dialysis catheter to a tunneled dialysis catheter with fluoroscopy.   Electronically Signed   By: Markus Daft M.D.   On: 09/25/2013 16:25   Ir Fluoro Guide Cv Line Right  09/21/2013   CLINICAL DATA:  Renal failure  EXAM: IR RIGHT FLOURO GUIDE CV LINE; IR ULTRASOUND GUIDANCE VASC ACCESS RIGHT  MEDICATIONS AND MEDICAL HISTORY: None  ANESTHESIA/SEDATION: None  CONTRAST:  None  FLUOROSCOPY TIME:  18 seconds.  PROCEDURE: The procedure, risks, benefits, and alternatives were explained to the patient. Questions regarding the procedure were encouraged and answered. The patient understands and consents to the procedure.  The right neck was prepped with Betadine in a sterile fashion, and a sterile drape was applied covering the operative field. A sterile gown and sterile gloves were used for the procedure.  Under sonographic guidance, a micropuncture needle was inserted into the right internal jugular vein and removed over a 018 wire which was up sized to  a 3 J. the 20 cm temporary dialysis catheter was advanced over the wire to the right atrium. It was flushed and sewn in place.  COMPLICATIONS: None  FINDINGS: Tip of the temporary dialysis catheter is at the right atrium  IMPRESSION: Successful placement of a temporary right internal jugular vein dialysis catheter.   Electronically Signed   By: Maryclare Bean M.D.   On: 09/21/2013 12:15   Ir US Guide Vasc Access Left  09/14/2013   CLINICAL DATA:  Poor venous access, request for PICC  EXAM: DUAL LUMEN LEFT MIDLINE VENOUS CATHETER PLACEMENT WITH ULTRASOUND AND FLUOROSCOPIC GUIDANCE  ADDITIONAL VENOGRAPHY OF THE RIGHT UPPER EXTREMITY  FLUOROSCOPY TIME:  7 MIN AND 30 SECONDS.  PROCEDURE: The  patient's family member was advised of the possible risks and complications and agreed to undergo the procedure. The patient was then brought to the angiographic suite for the procedure.  The right and left arm was prepped with chlorhexidine, draped in the usual sterile fashion using maximum barrier technique (cap and mask, sterile gown, sterile gloves, large sterile sheet, hand hygiene and cutaneous antisepsis) and infiltrated locally with 1% Lidocaine.  Ultrasound demonstrated patency of the right and left brachial vein, and this was documented with an image. Under real-time ultrasound guidance, the right brachial vein was accessed with a 21 gauge micropuncture needle and image documentation was performed. A 0.018 wire was introduced in to the vein. Over this, a dilator introducer was placed, the wire was unable to be advanced proximally so a venogram was performed with 10 cc of Omnipaque, this revealed venous extravasation. The right brachial vein site was abandoned and manual pressure was held until the site was soft with no continued bleeding. The left brachial vein was accessed with a 21 gauge micropuncture needle and multiple guidewires used to advance to the SVC/RA junction. A 5 French dual lumen power-injectable PICC was advanced, however unable to cross proximally to the lower SVC/right atrial junction, the PICC was left midline for this reason. Fluoroscopy during the procedure and fluoro spot radiograph confirms appropriate catheter position. The catheter aspirated and flushed well and was then covered with a sterile dressing.  Complications: Extravasation of 10 cc of Omnipaque to right brachial vein. Unable to advance left brachial vein PICC to the SVC/RA junction, left brachial PICC was placed midline.  IMPRESSION: Successful left arm midline VENOUS CATHETER placement with ultrasound and fluoroscopic guidance. The catheter is ready for use.  Read By:  Tsosie Billing PA-C   Electronically Signed   By:  Aletta Edouard M.D.   On: 09/14/2013 11:51   Ir US Guide Vasc Access Right  09/21/2013   CLINICAL DATA:  Renal failure  EXAM: IR RIGHT FLOURO GUIDE CV LINE; IR ULTRASOUND GUIDANCE VASC ACCESS RIGHT  MEDICATIONS AND MEDICAL HISTORY: None  ANESTHESIA/SEDATION: None  CONTRAST:  None  FLUOROSCOPY TIME:  18 seconds.  PROCEDURE: The procedure, risks, benefits, and alternatives were explained to the patient. Questions regarding the procedure were encouraged and answered. The patient understands and consents to the procedure.  The right neck was prepped with Betadine in a sterile fashion, and a sterile drape was applied covering the operative field. A sterile gown and sterile gloves were used for the procedure.  Under sonographic guidance, a micropuncture needle was inserted into the right internal jugular vein and removed over a 018 wire which was up sized to a 3 J. the 20 cm temporary dialysis catheter was advanced over the wire to the right atrium. It  was flushed and sewn in place.  COMPLICATIONS: None  FINDINGS: Tip of the temporary dialysis catheter is at the right atrium  IMPRESSION: Successful placement of a temporary right internal jugular vein dialysis catheter.   Electronically Signed   By: Maryclare Bean M.D.   On: 09/21/2013 12:15   Dg Chest Port 1 View  09/18/2013   CLINICAL DATA:  Respiratory distress, shortness of breath.  EXAM: PORTABLE CHEST - 1 VIEW  COMPARISON:  09/17/2013  FINDINGS: Prior CABG and valve replacement. Left pass catheterization is in stable position. Cardiomegaly. Left lower lobe opacity, likely atelectasis. No effusions or overt edema.  IMPRESSION: Cardiomegaly.  Left lower lobe atelectasis.   Electronically Signed   By: Rolm Baptise M.D.   On: 09/18/2013 06:27   Dg Chest Port 1 View  09/17/2013   CLINICAL DATA:  Hemodialysis catheter placement  EXAM: PORTABLE CHEST - 1 VIEW  COMPARISON:  DG CHEST 1V PORT dated 09/17/2013; DG CHEST 1V PORT dated 09/13/2013; DG CHEST 1V PORT dated  09/08/2013  FINDINGS: Grossly unchanged enlarged cardiac silhouette and mediastinal contours post median sternotomy and valve replacement. Interval placement of left jugular approach dialysis catheter with tip projected over the superior aspect of the SVC. Lung volumes remain reduced. Pulmonary vasculature remains indistinct. Grossly unchanged perihilar and bibasilar heterogeneous / consolidative opacities, left greater than right. No definite pleural effusion or pneumothorax. Grossly unchanged bones including postsurgical/traumatic deformity of the distal end of the right clavicle.  IMPRESSION: 1. Interval placement of left jugular approach central venous catheter with tip projected over the superior SVC. No pneumothorax. 2. Similar findings of pulmonary edema and perihilar/bibasilar opacities, left greater than right, atelectasis versus infiltrate.   Electronically Signed   By: Sandi Mariscal M.D.   On: 09/17/2013 10:41   Dg Chest Port 1 View  09/17/2013   CLINICAL DATA:  Confirm PICC line placement.  EXAM: PORTABLE CHEST - 1 VIEW  COMPARISON:  09/13/2013  FINDINGS: No PICC line is visualized within the chest or shoulder regions bilaterally.  There is cardiomegaly. Prior median sternotomy and valve replacement. Mild vascular congestion. Left lower lobe airspace opacity again noted, unchanged. No visible effusions.  IMPRESSION: No visible PICC line.  Stable cardiomegaly and vascular congestion.  Stable left lower lobe atelectasis or consolidation/pneumonia.   Electronically Signed   By: Rolm Baptise M.D.   On: 09/17/2013 04:08   Dg Chest Port 1 View  09/13/2013   CLINICAL DATA:  Respiratory distress  EXAM: PORTABLE CHEST - 1 VIEW  COMPARISON:  DG CHEST 1V PORT dated 09/08/2013  FINDINGS: Evidence of CABG reidentified with moderate enlargement of the cardiac silhouette and central vascular congestion. Retrocardiac opacity persists. No overt alveolar edema or other filling processes otherwise identified. Trace if any  left pleural fluid. No acute osseous finding.  IMPRESSION: Cardiomegaly with central vascular congestion and persistent retrocardiac atelectasis or possibly early pneumonia.   Electronically Signed   By: Conchita Paris M.D.   On: 09/13/2013 13:06   Dg Chest Port 1 View  09/08/2013   CLINICAL DATA:  Chest pain, hypertension  EXAM: PORTABLE CHEST - 1 VIEW  COMPARISON:  September 06, 2013  FINDINGS: The heart size and mediastinal contours are stable. Patient is status post prior CABG. The heart size is enlarged. Patchy consolidation of left lung base is identified. There is mild atelectasis of right lung base. There is no pulmonary edema. There is probable small left pleural effusion. The visualized skeletal structures are stable.  IMPRESSION: Early  pneumonia of left lung base with small left pleural effusion.   Electronically Signed   By: Abelardo Diesel M.D.   On: 09/08/2013 23:41   Dg Abd Portable 2v  09/18/2013   CLINICAL DATA:  58 year old male with abdominal distension. Possible small bowel obstruction. Initial encounter.  EXAM: PORTABLE ABDOMEN - 2 VIEW  COMPARISON:  09/17/2013 and earlier.  FINDINGS: Supine and portable cross-table lateral views of the abdomen. Decreased gaseous distension of small bowel. Numerous gas-filled mostly nondilated loops persist. Persistent right colon gas. No pneumoperitoneum identified. Mild motion artifact on both views. Stable visualized osseous structures.  IMPRESSION: Improved bowel gas pattern with decreased gaseous distension of small bowel and persistent colon gas. Pattern could reflect improving small bowel obstruction or ileus.  No free air identified.   Electronically Signed   By: Lars Pinks M.D.   On: 09/18/2013 17:50         Subjective: Patient complains of abdominal pain, but overall is improving. The stools are improving. Denies any nausea, vomiting, chest pain, shortness breath, headache, dizziness, fevers, chills.  Objective: Filed Vitals:   10/07/13  0821 10/07/13 1224 10/07/13 1525 10/07/13 1824  BP: 104/73 108/64  121/76  Pulse: 81 77 107 84  Temp: 98.7 F (37.1 C) 98.4 F (36.9 C)  99 F (37.2 C)  TempSrc: Oral Oral  Oral  Resp: 16 19  18   Height:      Weight:      SpO2: 97% 93% 95% 96%    Intake/Output Summary (Last 24 hours) at 10/07/13 1929 Last data filed at 10/07/13 1856  Gross per 24 hour  Intake   1010 ml  Output      2 ml  Net   1008 ml   Weight change: -0.86 kg (-1 lb 14.3 oz) Exam:   General:  Pt is alert, follows commands appropriately, not in acute distress  HEENT: No icterus, No thrush, [place]/AT  Cardiovascular: RRR, S1/S2, no rubs, no gallops  Respiratory: CTA bilaterally, no wheezing, no crackles, no rhonchi  Abdomen: Soft/+BS, diffusely tender without any rebound or peritoneal signs, non distended, no guarding  Extremities: No edema, No lymphangitis, No petechiae, No rashes, no synovitis  Data Reviewed: Basic Metabolic Panel:  Recent Labs Lab 10/01/13 0540 10/05/13 0520  NA 137 133*  K 4.0 4.2  CL 96 93*  CO2 25 25  GLUCOSE 106* 105*  BUN 21 18  CREATININE 7.21* 7.61*  CALCIUM 8.2* 8.4   Liver Function Tests: No results found for this basename: AST, ALT, ALKPHOS, BILITOT, PROT, ALBUMIN,  in the last 168 hours No results found for this basename: LIPASE, AMYLASE,  in the last 168 hours No results found for this basename: AMMONIA,  in the last 168 hours CBC:  Recent Labs Lab 10/05/13 0520  WBC 14.8*  HGB 7.9*  HCT 22.9*  MCV 88.4  PLT 365   Cardiac Enzymes: No results found for this basename: CKTOTAL, CKMB, CKMBINDEX, TROPONINI,  in the last 168 hours BNP: No components found with this basename: POCBNP,  CBG:  Recent Labs Lab 10/01/13 1736 10/01/13 1816 10/02/13 0801 10/02/13 1219 10/02/13 1627  GLUCAP 146* 132* 105* 102* 105*    Recent Results (from the past 240 hour(s))  CLOSTRIDIUM DIFFICILE BY PCR     Status: None   Collection Time    10/06/13  4:49 AM       Result Value Ref Range Status   C difficile by pcr NEGATIVE  NEGATIVE Final  Scheduled Meds: . antiseptic oral rinse  15 mL Mouth Rinse q12n4p  . aspirin EC  81 mg Oral Daily  . [START ON 10/08/2013]  ceFAZolin (ANCEF) IV  2 g Intravenous On Call  . [START ON 10/08/2013] cefUROXime (ZINACEF)  IV  1.5 g Intravenous On Call to OR  . darbepoetin (ARANESP) injection - DIALYSIS  60 mcg Intravenous Q Fri-HD  . feeding supplement (RESOURCE BREEZE)  1 Container Oral BID BM  . ferric gluconate (FERRLECIT/NULECIT) IV  125 mg Intravenous Weekly  . heparin subcutaneous  5,000 Units Subcutaneous 3 times per day  . levothyroxine  200 mcg Oral QAC breakfast  . multivitamin  1 tablet Oral QHS  . nafcillin IV  2 g Intravenous Q4H  . pantoprazole  40 mg Oral Q1200  . rifampin  600 mg Oral Daily  . sevelamer carbonate  1.6 g Oral TID WC  . sodium chloride  3 mL Intravenous Q12H   Continuous Infusions:    Suzzanne Brunkhorst, DO  [facility] Pager [phone]  If 7PM-7AM, please contact night-coverage [email or web address] Password [email or web address] 10/07/2013, 7:29 PM   LOS: 29 days

## 2013-10-07 NOTE — Progress Notes (Signed)
Physical Therapy Treatment Patient Details Name: Bradley [name] MRN: [id number] DOB: October 18, 1955 Today's Date: 10/07/2013 Time: 5366-4403 PT Time Calculation (min): 25 min  PT Assessment / Plan / Recommendation  History of Present Illness Pt admit with sepsis and r/o endocarditis.  Anoxic brain injury.   PT Comments   Patient able to walk increased distance and improved participation in standing strengthening exercises.  Still fatigues really easy, but cognitively clearer today with daughter present and assisting throughout session.  Still feel he's good CIR candidate, but they cannot accept with pending long term dialysis needs so pt will need SNF level rehab versus LTACH.  Meeting goal for ambulation so updated.  Follow Up Recommendations  SNF     Does the patient have the potential to tolerate intense rehabilitation   Yes  Barriers to Discharge  None      Equipment Recommendations  Rolling walker with 5" wheels    Recommendations for Other Services  None  Frequency Min 2X/week   Progress towards PT Goals Progress towards PT goals: Progressing toward goals;Goals met and updated - see care plan  Plan Discharge plan needs to be updated;Frequency needs to be updated    Precautions / Restrictions Precautions Precautions: Fall   Pertinent Vitals/Pain No pain complaints; HR 107, SpO2 95% after ambulation    Mobility  Bed Mobility Overal bed mobility: Needs Assistance Bed Mobility: Supine to Sit Rolling: Min assist Supine to sit: Min assist;HOB elevated Sit to supine: Min guard General bed mobility comments: dtr. present and assisted pt. with bed mobility Transfers Overall transfer level: Needs assistance Equipment used: Rolling walker (2 wheeled) Transfers: Sit to/from Stand Sit to Stand: Min assist General transfer comment: increased time for completion of task Ambulation/Gait Ambulation/Gait assistance: Min guard Ambulation Distance (Feet): 110 Feet Assistive device:  Rolling walker (2 wheeled) Gait Pattern/deviations: Step-through pattern;Wide base of support;Decreased stride length General Gait Details: assist for safety with turning, still with anterior hip position in relation to base of support, but not LOB posterior noted    Exercises General Exercises - Upper Extremity Shoulder Flexion: AROM;Both;5 reps;Seated General Exercises - Lower Extremity Hip Flexion/Marching: AROM;Both;5 reps;Standing Heel Raises: AROM;Both;5 reps;Standing Mini-Sqauts: AROM;5 reps;Standing Other Exercises Other Exercises: seated rows x 5 reps with cues; visual demo    PT Goals (current goals can now be found in the care plan section) Acute Rehab PT Goals Patient Stated Goal: To get stronger Time For Goal Achievement: 10/21/13 Potential to Achieve Goals: Good  Visit Information  Last PT Received On: 10/07/13 Assistance Needed: +1 History of Present Illness: Pt admit with sepsis and r/o endocarditis.  Anoxic brain injury.    Subjective Data  Patient Stated Goal: To get stronger   Cognition  Cognition Arousal/Alertness: Awake/alert Behavior During Therapy: WFL for tasks assessed/performed Overall Cognitive Status: Impaired/Different from baseline Area of Impairment: Memory;Problem solving Current Attention Level: Selective Memory: Decreased short-term memory Following Commands: Follows multi-step commands with increased time Safety/Judgement: Decreased awareness of safety Awareness: Intellectual Problem Solving: Slow processing;Decreased initiation;Requires verbal cues General Comments: Delayed processing time      Balance  Balance Standing balance support: Bilateral upper extremity supported Standing balance-Leahy Scale: Poor Standing balance comment: UE assist needed for safety  End of Session PT - End of Session Equipment Utilized During Treatment: Gait belt Activity Tolerance: Patient limited by fatigue Patient left: in bed;with call bell/phone  within reach;with bed alarm set;with family/visitor present   GP     [facility] 10/07/2013, 4:27 PM [name], PT  [phone] 10/07/2013   

## 2013-10-08 ENCOUNTER — Encounter (HOSPITAL_COMMUNITY): Payer: Self-pay | Admitting: Anesthesiology

## 2013-10-08 ENCOUNTER — Encounter (HOSPITAL_COMMUNITY): Payer: Medicaid Other | Admitting: Anesthesiology

## 2013-10-08 ENCOUNTER — Telehealth: Payer: Self-pay | Admitting: Vascular Surgery

## 2013-10-08 ENCOUNTER — Other Ambulatory Visit: Payer: Self-pay | Admitting: *Deleted

## 2013-10-08 ENCOUNTER — Encounter (HOSPITAL_COMMUNITY)
Admission: EM | Disposition: A | Discharge status: Home Health | Payer: Self-pay | Source: Home / Self Care | Attending: Internal Medicine

## 2013-10-08 ENCOUNTER — Inpatient Hospital Stay (HOSPITAL_COMMUNITY): Payer: Medicaid Other | Admitting: Anesthesiology

## 2013-10-08 DIAGNOSIS — Z4931 Encounter for adequacy testing for hemodialysis: Secondary | ICD-10-CM

## 2013-10-08 DIAGNOSIS — N186 End stage renal disease: Secondary | ICD-10-CM

## 2013-10-08 HISTORY — PX: AV FISTULA PLACEMENT: SHX1204

## 2013-10-08 LAB — RENAL FUNCTION PANEL
Albumin: 1.5 g/dL — ABNORMAL LOW (ref 3.5–5.2)
BUN: 16 mg/dL (ref 6–23)
CO2: 25 mEq/L (ref 19–32)
Calcium: 8.2 mg/dL — ABNORMAL LOW (ref 8.4–10.5)
Chloride: 94 mEq/L — ABNORMAL LOW (ref 96–112)
Creatinine, Ser: 7.91 mg/dL — ABNORMAL HIGH (ref 0.50–1.35)
GFR calc Af Amer: 8 mL/min — ABNORMAL LOW (ref >90)
GFR calc non Af Amer: 7 mL/min — ABNORMAL LOW (ref >90)
Glucose, Bld: 90 mg/dL (ref 70–99)
Phosphorus: 5.9 mg/dL — ABNORMAL HIGH (ref 2.3–4.6)
Potassium: 3.7 mEq/L (ref 3.7–5.3)
Sodium: 137 mEq/L (ref 137–147)

## 2013-10-08 LAB — POCT I-STAT 4, (NA,K, GLUC, HGB,HCT)
Glucose, Bld: 113 mg/dL — ABNORMAL HIGH (ref 70–99)
HCT: 21 % — ABNORMAL LOW (ref 39.0–52.0)
Hemoglobin: 7.1 g/dL — ABNORMAL LOW (ref 13.0–17.0)
Potassium: 3.4 mEq/L — ABNORMAL LOW (ref 3.7–5.3)
Sodium: 136 mEq/L — ABNORMAL LOW (ref 137–147)

## 2013-10-08 LAB — HEPATITIS B SURFACE ANTIGEN: Hepatitis B Surface Ag: NEGATIVE

## 2013-10-08 LAB — CBC
HCT: 21.7 % — ABNORMAL LOW (ref 39.0–52.0)
Hemoglobin: 7.4 g/dL — ABNORMAL LOW (ref 13.0–17.0)
MCH: 30.5 pg (ref 26.0–34.0)
MCHC: 34.1 g/dL (ref 30.0–36.0)
MCV: 89.3 fL (ref 78.0–100.0)
Platelets: 354 10*3/uL (ref 150–400)
RBC: 2.43 MIL/uL — ABNORMAL LOW (ref 4.22–5.81)
RDW: 20 % — ABNORMAL HIGH (ref 11.5–15.5)
WBC: 13.7 10*3/uL — ABNORMAL HIGH (ref 4.0–10.5)

## 2013-10-08 LAB — PROTIME-INR
INR: 1.31 (ref 0.00–1.49)
Prothrombin Time: 16 seconds — ABNORMAL HIGH (ref 11.6–15.2)

## 2013-10-08 SURGERY — ARTERIOVENOUS (AV) FISTULA CREATION
Anesthesia: Monitor Anesthesia Care | Site: Arm Lower | Laterality: Left

## 2013-10-08 MED ORDER — PHENYLEPHRINE HCL 10 MG/ML IJ SOLN
10.0000 mg | INTRAVENOUS | Status: DC | PRN
  Administered 2013-10-08: 20 ug/min via INTRAVENOUS

## 2013-10-08 MED ORDER — EPHEDRINE SULFATE 50 MG/ML IJ SOLN
INTRAMUSCULAR | Status: AC
  Filled 2013-10-08: qty 1

## 2013-10-08 MED ORDER — ONDANSETRON HCL 4 MG/2ML IJ SOLN: 4.0000 mg | Freq: Once | INTRAMUSCULAR | Status: DC | PRN

## 2013-10-08 MED ORDER — 0.9 % SODIUM CHLORIDE (POUR BTL) OPTIME
TOPICAL | Status: DC | PRN
  Administered 2013-10-08: 1000 mL

## 2013-10-08 MED ORDER — PHENOL 1.4 % MT LIQD
1.0000 | OROMUCOSAL | Status: DC | PRN
  Filled 2013-10-08: qty 177

## 2013-10-08 MED ORDER — ONDANSETRON HCL 4 MG/2ML IJ SOLN
INTRAMUSCULAR | Status: DC | PRN
  Administered 2013-10-08: 4 mg via INTRAVENOUS

## 2013-10-08 MED ORDER — PROPOFOL 10 MG/ML IV BOLUS
INTRAVENOUS | Status: DC | PRN
  Administered 2013-10-08: 120 mg via INTRAVENOUS

## 2013-10-08 MED ORDER — OXYCODONE HCL 5 MG/5ML PO SOLN: 5.0000 mg | Freq: Once | ORAL | Status: DC | PRN

## 2013-10-08 MED ORDER — HYDROMORPHONE HCL PF 1 MG/ML IJ SOLN: 0.2500 mg | INTRAMUSCULAR | Status: DC | PRN

## 2013-10-08 MED ORDER — MIDAZOLAM HCL 2 MG/2ML IJ SOLN
INTRAMUSCULAR | Status: AC
  Filled 2013-10-08: qty 2

## 2013-10-08 MED ORDER — LIDOCAINE-EPINEPHRINE 1 %-1:100000 IJ SOLN
INTRAMUSCULAR | Status: AC
  Filled 2013-10-08: qty 1

## 2013-10-08 MED ORDER — SODIUM CHLORIDE 0.9 % IR SOLN
Status: DC | PRN
  Administered 2013-10-08: 09:00:00

## 2013-10-08 MED ORDER — PROPOFOL 10 MG/ML IV BOLUS
INTRAVENOUS | Status: AC
  Filled 2013-10-08: qty 20

## 2013-10-08 MED ORDER — PHENYLEPHRINE 40 MCG/ML (10ML) SYRINGE FOR IV PUSH (FOR BLOOD PRESSURE SUPPORT)
PREFILLED_SYRINGE | INTRAVENOUS | Status: AC
  Filled 2013-10-08: qty 10

## 2013-10-08 MED ORDER — SUCCINYLCHOLINE CHLORIDE 20 MG/ML IJ SOLN
INTRAMUSCULAR | Status: AC
  Filled 2013-10-08: qty 1

## 2013-10-08 MED ORDER — MIDAZOLAM HCL 5 MG/5ML IJ SOLN
INTRAMUSCULAR | Status: DC | PRN
  Administered 2013-10-08: 1 mg via INTRAVENOUS

## 2013-10-08 MED ORDER — SODIUM CHLORIDE 0.9 % IV SOLN
INTRAVENOUS | Status: DC | PRN
  Administered 2013-10-08: 07:00:00 via INTRAVENOUS

## 2013-10-08 MED ORDER — FENTANYL CITRATE 0.05 MG/ML IJ SOLN
INTRAMUSCULAR | Status: DC | PRN
  Administered 2013-10-08 (×2): 50 ug via INTRAVENOUS
  Administered 2013-10-08: 25 ug via INTRAVENOUS

## 2013-10-08 MED ORDER — OXYCODONE HCL 5 MG PO TABS: 5.0000 mg | ORAL_TABLET | Freq: Once | ORAL | Status: DC | PRN

## 2013-10-08 MED ORDER — LIDOCAINE-EPINEPHRINE 1 %-1:100000 IJ SOLN
INTRAMUSCULAR | Status: DC | PRN
  Administered 2013-10-08: 9 mL

## 2013-10-08 MED ORDER — OXYCODONE HCL 5 MG PO TABS
5.0000 mg | ORAL_TABLET | Freq: Four times a day (QID) | ORAL | Status: DC | PRN
  Administered 2013-10-08 – 2013-10-09 (×3): 5 mg via ORAL
  Filled 2013-10-08 (×3): qty 1

## 2013-10-08 MED ORDER — FENTANYL CITRATE 0.05 MG/ML IJ SOLN
INTRAMUSCULAR | Status: AC
  Filled 2013-10-08: qty 5

## 2013-10-08 MED ORDER — ONDANSETRON HCL 4 MG/2ML IJ SOLN
INTRAMUSCULAR | Status: AC
  Filled 2013-10-08: qty 2

## 2013-10-08 MED ORDER — HEPARIN SODIUM (PORCINE) 1000 UNIT/ML IJ SOLN
INTRAMUSCULAR | Status: DC | PRN
  Administered 2013-10-08: 2.1 mL via INTRAVENOUS

## 2013-10-08 MED ORDER — LIDOCAINE HCL (CARDIAC) 20 MG/ML IV SOLN
INTRAVENOUS | Status: DC | PRN
Start: 2013-10-08 — End: 2013-10-08
  Administered 2013-10-08: 50 mg via INTRAVENOUS

## 2013-10-08 MED ORDER — HEPARIN SODIUM (PORCINE) 1000 UNIT/ML IJ SOLN
INTRAMUSCULAR | Status: AC
  Filled 2013-10-08: qty 1

## 2013-10-08 SURGICAL SUPPLY — 37 items
ARMBAND PINK RESTRICT EXTREMIT (MISCELLANEOUS) ×2 IMPLANT
CANISTER SUCTION 2500CC (MISCELLANEOUS) ×2 IMPLANT
CATH EMB 3FR 80CM (CATHETERS) ×2 IMPLANT
CLIP TI MEDIUM 6 (CLIP) ×2 IMPLANT
CLIP TI WIDE RED SMALL 6 (CLIP) ×4 IMPLANT
COVER PROBE W GEL 5X96 (DRAPES) IMPLANT
COVER SURGICAL LIGHT HANDLE (MISCELLANEOUS) ×2 IMPLANT
DERMABOND ADHESIVE PROPEN (GAUZE/BANDAGES/DRESSINGS) ×1
DERMABOND ADVANCED (GAUZE/BANDAGES/DRESSINGS) ×1
DERMABOND ADVANCED .7 DNX12 (GAUZE/BANDAGES/DRESSINGS) ×1 IMPLANT
DERMABOND ADVANCED .7 DNX6 (GAUZE/BANDAGES/DRESSINGS) ×1 IMPLANT
ELECT REM PT RETURN 9FT ADLT (ELECTROSURGICAL) ×2
ELECTRODE REM PT RTRN 9FT ADLT (ELECTROSURGICAL) ×1 IMPLANT
GEL ULTRASOUND 20GR AQUASONIC (MISCELLANEOUS) IMPLANT
GLOVE BIO SURGEON STRL SZ 6.5 (GLOVE) ×4 IMPLANT
GLOVE BIOGEL PI IND STRL 7.0 (GLOVE) ×1 IMPLANT
GLOVE BIOGEL PI IND STRL 7.5 (GLOVE) ×1 IMPLANT
GLOVE BIOGEL PI INDICATOR 7.0 (GLOVE) ×1
GLOVE BIOGEL PI INDICATOR 7.5 (GLOVE) ×1
GLOVE SS BIOGEL STRL SZ 7 (GLOVE) ×1 IMPLANT
GLOVE SUPERSENSE BIOGEL SZ 7 (GLOVE) ×1
GOWN STRL REUS W/ TWL LRG LVL3 (GOWN DISPOSABLE) ×3 IMPLANT
GOWN STRL REUS W/TWL LRG LVL3 (GOWN DISPOSABLE) ×3
KIT BASIN OR (CUSTOM PROCEDURE TRAY) ×2 IMPLANT
KIT ROOM TURNOVER OR (KITS) ×2 IMPLANT
NS IRRIG 1000ML POUR BTL (IV SOLUTION) ×2 IMPLANT
PACK CV ACCESS (CUSTOM PROCEDURE TRAY) ×2 IMPLANT
PAD ARMBOARD 7.5X6 YLW CONV (MISCELLANEOUS) ×4 IMPLANT
SUT PROLENE 6 0 BV (SUTURE) ×2 IMPLANT
SUT VIC AB 3-0 SH 27 (SUTURE) ×1
SUT VIC AB 3-0 SH 27X BRD (SUTURE) ×1 IMPLANT
SYR 20CC LL (SYRINGE) ×2 IMPLANT
SYR TB 1ML LUER SLIP (SYRINGE) ×2 IMPLANT
TOWEL OR 17X24 6PK STRL BLUE (TOWEL DISPOSABLE) ×2 IMPLANT
TOWEL OR 17X26 10 PK STRL BLUE (TOWEL DISPOSABLE) ×2 IMPLANT
UNDERPAD 30X30 INCONTINENT (UNDERPADS AND DIAPERS) ×2 IMPLANT
WATER STERILE IRR 1000ML POUR (IV SOLUTION) ×2 IMPLANT

## 2013-10-08 NOTE — Anesthesia Preprocedure Evaluation (Addendum)
Anesthesia Evaluation  Patient identified by MRN, date of birth, ID band Patient awake and Patient confused    Reviewed: Allergy & Precautions, H&P , NPO status , Patient's Chart, lab work & pertinent test results, reviewed documented beta blocker date and time   Airway Mallampati: III TM Distance: >3 FB Neck ROM: Limited  Mouth opening: Limited Mouth Opening  Dental  (+) Teeth Intact, Dental Advisory Given   Pulmonary  breath sounds clear to auscultation  Pulmonary exam normal       Cardiovascular hypertension, Pt. on medications and Pt. on home beta blockers + Past MI + dysrhythmias Rhythm:Regular Rate:Normal     Neuro/Psych PSYCHIATRIC DISORDERS Anxiety Depression    GI/Hepatic   Endo/Other  Hypothyroidism   Renal/GU Renal disease     Musculoskeletal   Abdominal Normal abdominal exam  (+)   Peds  Hematology   Anesthesia Other Findings   Reproductive/Obstetrics                          Anesthesia Physical Anesthesia Plan  ASA: IV  Anesthesia Plan: MAC   Post-op Pain Management:    Induction: Intravenous  Airway Management Planned: Mask  Additional Equipment:   Intra-op Plan:   Post-operative Plan:   Informed Consent: I have reviewed the patients History and Physical, chart, labs and discussed the procedure including the risks, benefits and alternatives for the proposed anesthesia with the patient or authorized representative who has indicated his/her understanding and acceptance.   Dental advisory given  Plan Discussed with: CRNA, Anesthesiologist and Surgeon  Anesthesia Plan Comments:         Anesthesia Quick Evaluation

## 2013-10-08 NOTE — Anesthesia Postprocedure Evaluation (Signed)
  Anesthesia Post-op Note  Patient: Bradley [name]  Procedure(s) Performed: Procedure(s): ARTERIOVENOUS (AV) FISTULA CREATION- LEFT ARM; Radial Cephalic  (Left)  Patient Location: PACU  Anesthesia Type:General  Level of Consciousness: awake, alert  and oriented  Airway and Oxygen Therapy: Patient Spontanous Breathing  Post-op Pain: mild  Post-op Assessment: Post-op Vital signs reviewed, Patient's Cardiovascular Status Stable, Respiratory Function Stable, Patent Airway and Pain level controlled  Post-op Vital Signs: stable  Complications: No apparent anesthesia complications

## 2013-10-08 NOTE — Discharge Instructions (Signed)
    10/08/2013 Bradley [name] [id number] Apr 13, 1956  Surgeon(s): Mal [name], MD  Procedure(s): ARTERIOVENOUS (AV) FISTULA CREATION- LEFT ARM; Radial Cephalic  x Do not stick graft for 12 weeks

## 2013-10-08 NOTE — Op Note (Signed)
OPERATIVE REPORT  Date of Surgery: 09/08/2013 - 10/08/2013  Surgeon: [name], MD  Assistant: Leontine Locket, PA  Pre-op Diagnosis: End Stage Renal Disease  Post-op Diagnosis: End Stage Renal Disease  Procedure: Procedure(s): ARTERIOVENOUS (AV) FISTULA CREATION- LEFT ARM; Radial Cephalic   Anesthesia: LMA  EBL: Minimal  Complications: None  Procedure Details: Patient was taken the operating room placed in the supine position at which time satisfactory general-LMA anesthesia was administered. Left upper extremity was prepped Betadine scrub and solution draped in routine sterile manner. The cephalic vein had been imaged with the B-mode ultrasound-sono site prior to prepping and draping. Cephalic vein was widely patent forearm. The upper arm cephalic vein was occluded. It was decided to explore the cephalic vein at the wrist to see if radiocephalic fistula would be feasible. After infiltration with 1% Xylocaine with epinephrine longitudinal incision was made midway between the radial artery and cephalic vein just proximal to the wrist. Cephalic vein was dissected free ligated distally transected branches ligated with 4-0 silk ties and divided. It was a 3 mm vein it seemed suitable for fistula creation. Radial artery was exposed beneath the fascia had an excellent pulse was 3 mm in size. No heparin was given. Artery was occluded proximally and distally with Vesseloops a 15 blade extended with Potts scissors. The vein was carefully measured spatulated and anastomosed end to side with 6-0 Prolene. A solution and released and there was a good pulse and a loud Doppler flow in the fistula. It did communicate with the brachial system near the antecubital area as the upper arm cephalic vein was occluded. Adequate hemostasis was achieved wound closed in layers of Vicryl subcuticular fashion sterile dressing applied patient taken to recovery in stable condition   [name], MD 10/08/2013 9:38  AM

## 2013-10-08 NOTE — Procedures (Signed)
Pt seen on HD.  K 3.7 put on 4K bath. Ap 130  Vp 100.  He has outpt spot at [facility] 2nd shift SBP 111.

## 2013-10-08 NOTE — Progress Notes (Signed)
[facility] PROGRESS NOTE  Bradley [name] [id number] DOB: 1955/08/24 DOA: 09/08/2013 PCP: [name], MD  Assessment/Plan: 1. Sepsis, present on admission, secondary to MSSA bacteremia, infectious endocarditis, urinary tract infection. Blood cultures obtained on 09/08/2013 growing MSSA from both sites with followup cultures remaining sterile. Urinalysis obtained on 09/08/2013 growing MSSA. He had a transthoracic echocardiogram performed on 09/09/2013 that did not show evidence of vegetation. Infectious disease recommending a 6 week course of antibiotic therapy, with an end date on 10/21/2013. Transesophageal echocardiogram not recommended at this time. 2. Infectious endocarditis. As outlined above, patient seen by cardiology and infectious disease with recommendations for a 6 week course of antibiotic therapy with rifampin and nafcillin. Has a history of aortic valve replacement. Transthoracic echocardiogram did not reveal evidence of vegetations. 3. Acute renal failure/acute tubular necrosis. May have resulted from sepsis or perhaps gentamicin therapy. Patient was started on hemodialysis on 09/17/2013. He underwent arteriovenous fistula creation at left arm on 09/10/2013, procedure performed by Dr. Kellie Simmering of vascular surgery. He remains on hemodialysis, nephrology following. 4. Toxic metabolic encephalopathy, likely secondary to sepsis, renal failure, septic brain emboli. Improving, continue supportive care. He had an MRI of brain performed on 09/09/2013 that showed multiple bilateral supratentorial and right cerebellar regions consistent with acute partially hemorrhagic infarcts likely resulted from septic emboli. 5. Non ST segment elevation myocardial infarction, patient having peak troponin 2.26, stable, seen and evaluated by cardiology during this hospitalization with recommendations for heart cath. 6. Hypertension. Blood pressures remained stable 7. Hypothyroidism. Continue Synthroid  200 mcg by mouth daily  Code Status: Full Code Family Communication: Disposition Plan: Patient will likely require SNF placement social work consulted    Consultants:  Cardiology  Nephrology  Vascular surgery  Infectious disease  Neurology  Pulmonary critical care medicine  Interventional radiology  Procedures:  Arteriovenous fistula creation performed on 10/08/2013  Transthoracic echocardiogram performed on 09/09/2013 showing ejection fraction of 45-50%  Antibiotics:  Nafcillin  Rifampin  HPI/Subjective: 58 year old male with history of aortic stenosis, status post pediatric aortic valvulotomy at age 80 followed by tissue AVR (Bentall procedure) 02/2000 with cardiac cath 02/2010 with normal coronaries, chronic LBBB, thyroid cancer post thyroidectomy, hypothyroidism, HTN, and depression, who had been generally unwell for 2-3 weeks. He was seen in the ED on 09/06/13 and assessed as influenza related vomiting, diarrhea, abdominal pain, body aches, fever and mild confusion and was ultimately discharged on Tamiflu. Patient worsened at home with progressive confusion/disorientation and was admitted to the medicine service on 09/09/2013 presenting with mental status changes, generalized weakness, malaise with initial lab work showing leukocytosis and lactic acidosis. Initial chest x-ray showed left-sided infiltrate as CT scan of the brain showing findings consistent with scattered septic emboli.  Blood cultures drawn on 09/09/2011 growing staphylococcus arteries from both sets, organism susceptible to lactams, fluoroquinolones and tetracycline. He was initially started on broad-spectrum IV antibiotic therapy with vancomycin gentamicin and cefepime for coverage of presumed infectious endocarditis from a prostatic valve. Patient  having a prolonged hospitalization. His antibiotics were changed to nafcillin and rifampin with a stop date of 10/21/2013. A hemodialysis catheter was placed  05/20/2014 and hemodialysis was initiated. The patient has remained clinically stable. AVF placement on 10/08/2013. He remains deconditioned and will ultimately need a skilled nursing facility.    Objective: Filed Vitals:   10/08/13 1533  BP: 110/76  Pulse: 94  Temp: 98.7 F (37.1 C)  Resp: 18    Intake/Output Summary (Last 24 hours) at 10/08/13 1556 Last data filed  at 10/08/13 1533  Gross per 24 hour  Intake    960 ml  Output   2973 ml  Net  -2013 ml   Filed Weights   10/07/13 2054 10/08/13 1115 10/08/13 1533  Weight: 91.1 kg (200 lb 13.4 oz) 91.1 kg (200 lb 13.4 oz) 88.4 kg (194 lb 14.2 oz)    Exam:  General: Pt is alert, follows commands appropriately, not in acute distress  HEENT: No icterus, No thrush, [place]/AT  Cardiovascular: RRR, S1/S2, no rubs, no gallops  Respiratory: CTA bilaterally, no wheezing, no crackles, no rhonchi  Abdomen: Soft/+BS, diffusely tender without any rebound or peritoneal signs, non distended, no guarding  Extremities: No edema, No lymphangitis, No petechiae, No rashes, no synovitis   Data Reviewed: Basic Metabolic Panel:  Recent Labs Lab 10/05/13 0520 10/08/13 0644 10/08/13 0855  NA 133* 137 136*  K 4.2 3.7 3.4*  CL 93* 94*  --   CO2 25 25  --   GLUCOSE 105* 90 113*  BUN 18 16  --   CREATININE 7.61* 7.91*  --   CALCIUM 8.4 8.2*  --   PHOS  --  5.9*  --    Liver Function Tests:  Recent Labs Lab 10/08/13 0644  ALBUMIN 1.5*   No results found for this basename: LIPASE, AMYLASE,  in the last 168 hours No results found for this basename: AMMONIA,  in the last 168 hours CBC:  Recent Labs Lab 10/05/13 0520 10/08/13 0644 10/08/13 0855  WBC 14.8* 13.7*  --   HGB 7.9* 7.4* 7.1*  HCT 22.9* 21.7* 21.0*  MCV 88.4 89.3  --   PLT 365 354  --    Cardiac Enzymes: No results found for this basename: CKTOTAL, CKMB, CKMBINDEX, TROPONINI,  in the last 168 hours BNP (last 3 results)  Recent Labs  09/10/13 0753 09/14/13 0445   PROBNP 37349.0* >70000.0*   CBG:  Recent Labs Lab 10/01/13 1736 10/01/13 1816 10/02/13 0801 10/02/13 1219 10/02/13 1627  GLUCAP 146* 132* 105* 102* 105*    Recent Results (from the past 240 hour(s))  CLOSTRIDIUM DIFFICILE BY PCR     Status: None   Collection Time    10/06/13  4:49 AM      Result Value Ref Range Status   C difficile by pcr NEGATIVE  NEGATIVE Final     Studies: No results found.  Scheduled Meds: . antiseptic oral rinse  15 mL Mouth Rinse q12n4p  . aspirin EC  81 mg Oral Daily  .  ceFAZolin (ANCEF) IV  2 g Intravenous On Call  . darbepoetin (ARANESP) injection - DIALYSIS  60 mcg Intravenous Q Fri-HD  . feeding supplement (RESOURCE BREEZE)  1 Container Oral BID BM  . ferric gluconate (FERRLECIT/NULECIT) IV  125 mg Intravenous Weekly  . heparin subcutaneous  5,000 Units Subcutaneous 3 times per day  . levothyroxine  200 mcg Oral QAC breakfast  . multivitamin  1 tablet Oral QHS  . nafcillin IV  2 g Intravenous Q4H  . pantoprazole  40 mg Oral Q1200  . rifampin  600 mg Oral Daily  . sevelamer carbonate  1.6 g Oral TID WC  . sodium chloride  3 mL Intravenous Q12H   Continuous Infusions:   Principal Problem:   Sepsis Active Problems:   HYPOTHYROIDISM- TSH 7.5   Aortic valve replaced- Bentall propceedure 2011   Hypertension   Depression   LBBB (left bundle branch block)   Major depressive disorder, recurrent, severe with psychotic features-  hospitalized in Jan 2013   Generalized anxiety disorder   Altered mental state   Encephalitis   Septic embolism   HCAP (healthcare-associated pneumonia)   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   Acute diastolic heart failure   NSTEMI (non-ST elevated myocardial infarction)   Agitation   Acute respiratory alkalosis   Acute renal failure   Encephalopathy acute   Staphylococcus aureus bacteremia with sepsis   ST elevation (STEMI) myocardial infarction    Time spent: 35 minutes   Kelvin Cellar  [facility] Pager [phone]. If 7PM-7AM, please contact night-coverage at [email or web address], password [facility] 10/08/2013, 3:56 PM  LOS: 30 days

## 2013-10-08 NOTE — Interval H&P Note (Signed)
History and Physical Interval Note:  10/08/2013 8:14 AM  Bradley [name]  has presented today for surgery, with the diagnosis of End Stage Renal Disease  The various methods of treatment have been discussed with the patient and family. After consideration of risks, benefits and other options for treatment, the patient has consented to  Procedure(s): ARTERIOVENOUS (AV) FISTULA CREATION- LEFT ARM, VERSUS INSERTION OF LEFT ARM ARTERIOVENOUS GORTEX GRAFT (Left) as a surgical intervention .  The patient's history has been reviewed, patient examined, no change in status, stable for surgery.  I have reviewed the patient's chart and labs.  Questions were answered to the patient's satisfaction.     [name]

## 2013-10-08 NOTE — Progress Notes (Signed)
10/08/2013 11:43 AM Hemodialysis Outpatient Note; this patient has been accepted at the [facility] on a Tuesday,Thursday and Saturday 2nd shift schedule. The center can begin treatment on Saturday March 14 at 12:00pm. This patient does not have to visit the center on Friday to sign paperwork and consents.Thank you. Gordy Savers

## 2013-10-08 NOTE — Telephone Encounter (Addendum)
Message copied by Gena Fray on Wed Oct 08, 2013  3:19 PM ------      Message from: Peter Minium K      Created: Wed Oct 08, 2013  9:36 AM      Regarding: Schedule                   ----- Message -----         From: Gabriel Earing, PA-C         Sent: 10/08/2013   9:29 AM           To: [facility] Charge Pool            S/p left radiocephalic AVF [id number].  F/u with Dr. Kellie Simmering in 6 weeks with duplex.            Thanks,      Samantha ------  10/08/13: spoke with pts spouse, dpm

## 2013-10-08 NOTE — H&P (View-Only) (Signed)
[facility]  Patient is pre-op for AV fistula verses graft tomorrow.  Shell Blanchette MAUREEN PA-C

## 2013-10-08 NOTE — Transfer of Care (Signed)
Immediate Anesthesia Transfer of Care Note  Patient: Bradley [name]  Procedure(s) Performed: Procedure(s): ARTERIOVENOUS (AV) FISTULA CREATION- LEFT ARM; Radial Cephalic  (Left)  Patient Location: PACU  Anesthesia Type:General  Level of Consciousness: awake, alert  and oriented  Airway & Oxygen Therapy: Patient Spontanous Breathing and Patient connected to face mask oxygen  Post-op Assessment: Report given to PACU RN  Post vital signs: Reviewed and stable  Complications: No apparent anesthesia complications

## 2013-10-08 NOTE — Preoperative (Signed)
Beta Blockers   Reason not to administer Beta Blockers:Not Applicable 

## 2013-10-09 ENCOUNTER — Encounter (HOSPITAL_COMMUNITY): Payer: Self-pay | Admitting: Vascular Surgery

## 2013-10-09 LAB — PARATHYROID HORMONE, INTACT (NO CA): PTH: 175.2 pg/mL — ABNORMAL HIGH (ref 14.0–72.0)

## 2013-10-09 MED ORDER — POTASSIUM CHLORIDE CRYS ER 20 MEQ PO TBCR
40.0000 meq | EXTENDED_RELEASE_TABLET | Freq: Once | ORAL | Status: AC
  Administered 2013-10-09: 40 meq via ORAL
  Filled 2013-10-09: qty 2

## 2013-10-09 NOTE — Progress Notes (Signed)
10/09/2013 1:14 PM  Sutures to right neck HD catheter insertion site removed per Dr. Etheleen Nicks request.  One suture removed.  Pt tolerated well.  Area scabbed over, no drainage noted.  Area OTA.  Will continue to monitor patient. Princella Pellegrini

## 2013-10-09 NOTE — Progress Notes (Signed)
S: No new CO.   O:BP 109/82  Pulse 92  Temp(Src) 98.9 F (37.2 C) (Oral)  Resp 19  Ht 6\' 1"  (1.854 m)  Wt 88.4 kg (194 lb 14.2 oz)  BMI 25.72 kg/m2  SpO2 98%  Intake/Output Summary (Last 24 hours) at 10/09/13 1059 Last data filed at 10/09/13 0905  Gross per 24 hour  Intake    110 ml  Output   2948 ml  Net  -2838 ml   Weight change: 0 kg (0 lb) IN:[id number] and alert CVS:RRR Resp:clear Abd:+ BS NTND Ext:no edema  Lt AVF + bruit NEURO:CNI Ox2 no asterixis Rt Permcath   . antiseptic oral rinse  15 mL Mouth Rinse q12n4p  . aspirin EC  81 mg Oral Daily  . darbepoetin (ARANESP) injection - DIALYSIS  60 mcg Intravenous Q Fri-HD  . feeding supplement (RESOURCE BREEZE)  1 Container Oral BID BM  . ferric gluconate (FERRLECIT/NULECIT) IV  125 mg Intravenous Weekly  . heparin subcutaneous  5,000 Units Subcutaneous 3 times per day  . levothyroxine  200 mcg Oral QAC breakfast  . multivitamin  1 tablet Oral QHS  . nafcillin IV  2 g Intravenous Q4H  . pantoprazole  40 mg Oral Q1200  . rifampin  600 mg Oral Daily  . sevelamer carbonate  1.6 g Oral TID WC  . sodium chloride  3 mL Intravenous Q12H   No results found. BMET    Component Value Date/Time   NA 136* 10/08/2013 0855   K 3.4* 10/08/2013 0855   CL 94* 10/08/2013 0644   CO2 25 10/08/2013 0644   GLUCOSE 113* 10/08/2013 0855   BUN 16 10/08/2013 0644   CREATININE 7.91* 10/08/2013 0644   CALCIUM 8.2* 10/08/2013 0644   GFRNONAA 7* 10/08/2013 0644   GFRAA 8* 10/08/2013 0644   CBC    Component Value Date/Time   WBC 13.7* 10/08/2013 0644   RBC 2.43* 10/08/2013 0644   HGB 7.1* 10/08/2013 0855   HCT 21.0* 10/08/2013 0855   PLT 354 10/08/2013 0644   MCV 89.3 10/08/2013 0644   MCH 30.5 10/08/2013 0644   MCHC 34.1 10/08/2013 0644   RDW 20.0* 10/08/2013 0644   LYMPHSABS 0.8 09/19/2013 0455   MONOABS 0.5 09/19/2013 0455   EOSABS 0.1 09/19/2013 0455   BASOSABS 0.0 09/19/2013 0455     Assessment: 1. ESRD 2. MSSA bacteremia, ? Endocarditis. On  nafcillin 3. Hx HTN 4. Anemia, on aranesp  Plan: 1. Plan HD in AM 2.  PTH pending 3. If he goes home, Nafcillin may be difficult to get q 4hrs.  Rec asking ID if better alternative.   Anyssa Sharpless T

## 2013-10-09 NOTE — Progress Notes (Signed)
[facility] PROGRESS NOTE  Bradley [name] [id number] DOB: 05-Feb-1956 DOA: 09/08/2013 PCP: [name], MD  Assessment/Plan: 1. Sepsis, present on admission, secondary to MSSA bacteremia, infectious endocarditis, urinary tract infection. Blood cultures obtained on 09/08/2013 growing MSSA from both sites with followup cultures remaining sterile. Urinalysis obtained on 09/08/2013 growing MSSA. He had a transthoracic echocardiogram performed on 09/09/2013 that did not show evidence of vegetation. Infectious disease recommending a 6 week course of antibiotic therapy, with an end date on 10/21/2013. Transesophageal echocardiogram not recommended at this time. 2. Infectious endocarditis. As outlined above, patient seen by cardiology and infectious disease with recommendations for a 6 week course of antibiotic therapy with rifampin and nafcillin. Nafcillin may be difficult to administer at home given q 4 dosing, will discuss of ID regarding possible alternatives.  3. Acute renal failure/acute tubular necrosis. May have resulted from sepsis or perhaps gentamicin therapy. Patient was started on hemodialysis on 09/17/2013. He underwent arteriovenous fistula creation at left arm on 09/10/2013, procedure performed by Dr. Kellie Simmering of vascular surgery. He remains on hemodialysis, nephrology following. 4. Toxic metabolic encephalopathy, likely secondary to sepsis, renal failure, septic brain emboli. Improving, continue supportive care. He had an MRI of brain performed on 09/09/2013 that showed multiple bilateral supratentorial and right cerebellar regions consistent with acute partially hemorrhagic infarcts likely resulted from septic emboli. 5. Non ST segment elevation myocardial infarction, patient having peak troponin 2.26, stable, seen and evaluated by cardiology during this hospitalization with recommendations for heart cath. 6. Hypertension. Blood pressures remained stable 7. Hypothyroidism. Continue  Synthroid 200 mcg by mouth daily  Code Status: Full Code Family Communication: Disposition Plan: Patient will likely require SNF placement social work consulted    Consultants:  Cardiology  Nephrology  Vascular surgery  Infectious disease  Neurology  Pulmonary critical care medicine  Interventional radiology  Procedures:  Arteriovenous fistula creation performed on 10/08/2013  Transthoracic echocardiogram performed on 09/09/2013 showing ejection fraction of 45-50%  Antibiotics:  Nafcillin  Rifampin  HPI/Subjective: 58 year old male with history of aortic stenosis, status post pediatric aortic valvulotomy at age 12 followed by tissue AVR (Bentall procedure) 02/2000 with cardiac cath 02/2010 with normal coronaries, chronic LBBB, thyroid cancer post thyroidectomy, hypothyroidism, HTN, and depression, who had been generally unwell for 2-3 weeks. He was seen in the ED on 09/06/13 and assessed as influenza related vomiting, diarrhea, abdominal pain, body aches, fever and mild confusion and was ultimately discharged on Tamiflu. Patient worsened at home with progressive confusion/disorientation and was admitted to the medicine service on 09/09/2013 presenting with mental status changes, generalized weakness, malaise with initial lab work showing leukocytosis and lactic acidosis. Initial chest x-ray showed left-sided infiltrate as CT scan of the brain showing findings consistent with scattered septic emboli.  Blood cultures drawn on 09/09/2011 growing staphylococcus arteries from both sets, organism susceptible to lactams, fluoroquinolones and tetracycline. He was initially started on broad-spectrum IV antibiotic therapy with vancomycin gentamicin and cefepime for coverage of presumed infectious endocarditis from a prostatic valve. Patient  having a prolonged hospitalization. His antibiotics were changed to nafcillin and rifampin with a stop date of 10/21/2013. A hemodialysis catheter was  placed 05/20/2014 and hemodialysis was initiated. The patient has remained clinically stable. AVF placement on 10/08/2013. He remains deconditioned. Family wishing to care for him at home.    Objective: Filed Vitals:   10/09/13 1407  BP: 113/73  Pulse: 92  Temp: 98.6 F (37 C)  Resp: 18    Intake/Output Summary (Last 24 hours) at  10/09/13 1740 Last data filed at 10/09/13 1300  Gross per 24 hour  Intake    220 ml  Output      0 ml  Net    220 ml   Filed Weights   10/07/13 2054 10/08/13 1115 10/08/13 1533  Weight: 91.1 kg (200 lb 13.4 oz) 91.1 kg (200 lb 13.4 oz) 88.4 kg (194 lb 14.2 oz)    Exam:  General: Pt is alert, follows commands appropriately, not in acute distress  HEENT: No icterus, No thrush, [place]/AT  Cardiovascular: RRR, S1/S2, no rubs, no gallops  Respiratory: CTA bilaterally, no wheezing, no crackles, no rhonchi  Abdomen: Soft/+BS, diffusely tender without any rebound or peritoneal signs, non distended, no guarding  Extremities: No edema, No lymphangitis, No petechiae, No rashes, no synovitis   Data Reviewed: Basic Metabolic Panel:  Recent Labs Lab 10/05/13 0520 10/08/13 0644 10/08/13 0855  NA 133* 137 136*  K 4.2 3.7 3.4*  CL 93* 94*  --   CO2 25 25  --   GLUCOSE 105* 90 113*  BUN 18 16  --   CREATININE 7.61* 7.91*  --   CALCIUM 8.4 8.2*  --   PHOS  --  5.9*  --    Liver Function Tests:  Recent Labs Lab 10/08/13 0644  ALBUMIN 1.5*   No results found for this basename: LIPASE, AMYLASE,  in the last 168 hours No results found for this basename: AMMONIA,  in the last 168 hours CBC:  Recent Labs Lab 10/05/13 0520 10/08/13 0644 10/08/13 0855  WBC 14.8* 13.7*  --   HGB 7.9* 7.4* 7.1*  HCT 22.9* 21.7* 21.0*  MCV 88.4 89.3  --   PLT 365 354  --    Cardiac Enzymes: No results found for this basename: CKTOTAL, CKMB, CKMBINDEX, TROPONINI,  in the last 168 hours BNP (last 3 results)  Recent Labs  09/10/13 0753 09/14/13 0445  PROBNP  37349.0* >70000.0*   CBG: No results found for this basename: GLUCAP,  in the last 168 hours  Recent Results (from the past 240 hour(s))  CLOSTRIDIUM DIFFICILE BY PCR     Status: None   Collection Time    10/06/13  4:49 AM      Result Value Ref Range Status   C difficile by pcr NEGATIVE  NEGATIVE Final     Studies: No results found.  Scheduled Meds: . antiseptic oral rinse  15 mL Mouth Rinse q12n4p  . aspirin EC  81 mg Oral Daily  . darbepoetin (ARANESP) injection - DIALYSIS  60 mcg Intravenous Q Fri-HD  . feeding supplement (RESOURCE BREEZE)  1 Container Oral BID BM  . ferric gluconate (FERRLECIT/NULECIT) IV  125 mg Intravenous Weekly  . heparin subcutaneous  5,000 Units Subcutaneous 3 times per day  . levothyroxine  200 mcg Oral QAC breakfast  . multivitamin  1 tablet Oral QHS  . nafcillin IV  2 g Intravenous Q4H  . pantoprazole  40 mg Oral Q1200  . rifampin  600 mg Oral Daily  . sevelamer carbonate  1.6 g Oral TID WC  . sodium chloride  3 mL Intravenous Q12H   Continuous Infusions:   Principal Problem:   Sepsis Active Problems:   HYPOTHYROIDISM- TSH 7.5   Aortic valve replaced- Bentall propceedure 2011   Hypertension   Depression   LBBB (left bundle branch block)   Major depressive disorder, recurrent, severe with psychotic features- hospitalized in Jan 2013   Generalized anxiety disorder   Altered  mental state   Encephalitis   Septic embolism   HCAP (healthcare-associated pneumonia)   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   Acute diastolic heart failure   NSTEMI (non-ST elevated myocardial infarction)   Agitation   Acute respiratory alkalosis   Acute renal failure   Encephalopathy acute   Staphylococcus aureus bacteremia with sepsis   ST elevation (STEMI) myocardial infarction    Time spent: 30 minutes   Kelvin Cellar  [facility] Pager [phone]. If 7PM-7AM, please contact night-coverage at [email or web address], password [facility] 10/09/2013, 5:40 PM  LOS: 31  days

## 2013-10-09 NOTE — Progress Notes (Signed)
OT Cancellation Note  Patient Details Name: Bradley [name] MRN: [id number] DOB: 09-01-1955   Cancelled Treatment:    Reason Eval/Treat Not Completed: Fatigue/lethargy limiting ability to participate. Pt states that he is too tired after having a visitor, sitting up in recliner and walking with PT earlier today and requested OT return tomorrow. OT reminded pt that he has dialysis tomorrow. Will re attempt tomorrow  [name] 10/09/2013, 2:31 PM

## 2013-10-09 NOTE — Progress Notes (Signed)
Patient ID: Bradley [name], male   DOB: Feb 08, 1956, 58 y.o.   MRN: [id number] The patient has nicely healing left forearm incision for radial to cephalic AV fistula. Pulse and thrill palpable. Left hand slightly pale but no pain.  Forearm cephalic vein was borderline in size. Fistula is patent at this time. We'll obtain duplex scan in 6 weeks to determine if this will be a usable fistula or will  need other procedures

## 2013-10-09 NOTE — Progress Notes (Signed)
Physical Therapy Treatment Patient Details Name: Bradley [name] MRN: [id number] DOB: Jul 18, 1956 Today's Date: 10/09/2013 Time: 1007-1219 PT Time Calculation (min): 29 min  PT Assessment / Plan / Recommendation  History of Present Illness Pt admit with sepsis and r/o endocarditis.  Anoxic brain injury.   PT Comments   Progressing well this session increased distance with ambulation without severe fatigue/HR issues.  Still needs assist for maneuvering around obstacles and for safety with significantly decreased gait speed.  Feel can be managed at home with OT/PT/SLP services as well as equipment below.  Depending on family preference may also need hospital bed.  Added goal for stairs as pt has 5 for home entry.  Follow Up Recommendations  Home health PT;Supervision/Assistance - 24 hour           Equipment Recommendations  Rolling walker with 5" wheels;3in1 (PT) ?hospital bed   Recommendations for Other Services    Frequency Min 3X/week   Progress towards PT Goals Progress towards PT goals: Progressing toward goals;Goals met and updated - see care plan  Plan Discharge plan needs to be updated;Frequency needs to be updated    Precautions / Restrictions Precautions Precautions: Fall   Pertinent Vitals/Pain No pain     Mobility  Bed Mobility Sit to supine: Supervision Transfers Transfers: Sit to/from Stand Sit to Stand: Supervision General transfer comment: little impulsive; told to scoot to edge of chair and pt stood up Ambulation/Gait Ambulation/Gait assistance: Min guard;Supervision Ambulation Distance (Feet): 300 Feet Assistive device: Rolling walker (2 wheeled) Gait Pattern/deviations: Step-through pattern;Trunk flexed;Wide base of support General Gait Details: slow pace, assist to maneuver around obstacles on left, occasionally heavy reliance on walker, then able to loosen grip/weight with cues      PT Goals (current goals can now be found in the care plan section)     Visit Information  Last PT Received On: 10/09/13 Assistance Needed: +1 History of Present Illness: Pt admit with sepsis and r/o endocarditis.  Anoxic brain injury.    Subjective Data   I really feel better, want to work today.   Cognition  Cognition Arousal/Alertness: Awake/alert Behavior During Therapy: WFL for tasks assessed/performed (little labile) Area of Impairment: Problem solving;Attention Current Attention Level: Selective Awareness: Emergent Problem Solving: Decreased initiation General Comments: can identify obstacles on left side, but needs assist or cues to maneuver around    Balance  Balance Standing balance-Leahy Scale: Fair Standing balance comment: improved with static stance and no longer keeping hips anterior to base of support  End of Session PT - End of Session Equipment Utilized During Treatment: Gait belt Activity Tolerance: Patient tolerated treatment well Patient left: in bed;with call bell/phone within reach;with bed alarm set;with family/visitor present   GP     [facility] 10/09/2013, 1:31 PM [name], [place] 10/09/2013

## 2013-10-10 ENCOUNTER — Inpatient Hospital Stay: Payer: Self-pay | Admitting: Internal Medicine

## 2013-10-10 DIAGNOSIS — I219 Acute myocardial infarction, unspecified: Secondary | ICD-10-CM

## 2013-10-10 LAB — RENAL FUNCTION PANEL
Albumin: 1.5 g/dL — ABNORMAL LOW (ref 3.5–5.2)
BUN: 15 mg/dL (ref 6–23)
CO2: 26 mEq/L (ref 19–32)
Calcium: 8.1 mg/dL — ABNORMAL LOW (ref 8.4–10.5)
Chloride: 96 mEq/L (ref 96–112)
Creatinine, Ser: 7.19 mg/dL — ABNORMAL HIGH (ref 0.50–1.35)
GFR calc Af Amer: 9 mL/min — ABNORMAL LOW (ref >90)
GFR calc non Af Amer: 8 mL/min — ABNORMAL LOW (ref >90)
Glucose, Bld: 117 mg/dL — ABNORMAL HIGH (ref 70–99)
Phosphorus: 5.1 mg/dL — ABNORMAL HIGH (ref 2.3–4.6)
Potassium: 4.3 mEq/L (ref 3.7–5.3)
Sodium: 137 mEq/L (ref 137–147)

## 2013-10-10 LAB — CBC
HCT: 20.6 % — ABNORMAL LOW (ref 39.0–52.0)
Hemoglobin: 7.1 g/dL — ABNORMAL LOW (ref 13.0–17.0)
MCH: 31.3 pg (ref 26.0–34.0)
MCHC: 34.5 g/dL (ref 30.0–36.0)
MCV: 90.7 fL (ref 78.0–100.0)
Platelets: 316 10*3/uL (ref 150–400)
RBC: 2.27 MIL/uL — ABNORMAL LOW (ref 4.22–5.81)
RDW: 21 % — ABNORMAL HIGH (ref 11.5–15.5)
WBC: 13.3 10*3/uL — ABNORMAL HIGH (ref 4.0–10.5)

## 2013-10-10 LAB — PREPARE RBC (CROSSMATCH)

## 2013-10-10 MED ORDER — CLONAZEPAM 1 MG PO TABS: 1.0000 mg | ORAL_TABLET | Freq: Two times a day (BID) | ORAL | Status: DC | PRN

## 2013-10-10 MED ORDER — LORAZEPAM 2 MG/ML IJ SOLN
INTRAMUSCULAR | Status: AC
  Administered 2013-10-10: 0.5 mg
  Filled 2013-10-10: qty 1

## 2013-10-10 MED ORDER — LORAZEPAM 0.5 MG PO TABS
ORAL_TABLET | ORAL | Status: AC
  Filled 2013-10-10: qty 1

## 2013-10-10 MED ORDER — SEVELAMER CARBONATE 0.8 G PO PACK: 1.6000 g | PACK | Freq: Three times a day (TID) | ORAL | Status: DC

## 2013-10-10 MED ORDER — LEVOTHYROXINE SODIUM 200 MCG PO TABS: 200.0000 ug | ORAL_TABLET | Freq: Every day | ORAL | Status: DC

## 2013-10-10 MED ORDER — ASPIRIN 81 MG PO TBEC: 81.0000 mg | DELAYED_RELEASE_TABLET | Freq: Every day | ORAL | Status: DC

## 2013-10-10 MED ORDER — BOOST / RESOURCE BREEZE PO LIQD: 1.0000 | Freq: Two times a day (BID) | ORAL | Status: DC

## 2013-10-10 MED ORDER — HEPARIN SODIUM (PORCINE) 1000 UNIT/ML IJ SOLN
8800.0000 [IU] | Freq: Once | INTRAMUSCULAR | Status: AC
  Administered 2013-10-10: 8800 [IU] via INTRAVENOUS

## 2013-10-10 MED ORDER — HALOPERIDOL LACTATE 5 MG/ML IJ SOLN
5.0000 mg | Freq: Once | INTRAMUSCULAR | Status: DC
  Filled 2013-10-10: qty 1

## 2013-10-10 MED ORDER — DARBEPOETIN ALFA-POLYSORBATE 60 MCG/0.3ML IJ SOLN
INTRAMUSCULAR | Status: AC
  Administered 2013-10-10: 60 ug
  Filled 2013-10-10: qty 0.3

## 2013-10-10 MED ORDER — RIFAMPIN 300 MG PO CAPS
600.0000 mg | ORAL_CAPSULE | Freq: Every day | ORAL | Status: AC
Start: 2013-10-10 — End: 2013-10-21

## 2013-10-10 MED ORDER — OXYCODONE HCL 5 MG PO TABS: 5.0000 mg | ORAL_TABLET | Freq: Four times a day (QID) | ORAL | Status: DC | PRN

## 2013-10-10 NOTE — Progress Notes (Signed)
Patient discharged to home with wife and son via wheelchair.  No PIV in place at time of discharge.  Gave prescriptions to wife.  Discussed care of Left AVF and RT HD Cath.  Answered all questions.  Home medical equipment at bedside.  Discharge paperwork given and discharge appointments discussed.  Earleen Reaper RN-BC, [employer]

## 2013-10-10 NOTE — Procedures (Signed)
Pt seen on HD.  Ap 150  Vp 80.  Still has episodes of anxiety, though tolerating HD fairly well.  He has outpt spot at [facility] TTS 2nd shift.

## 2013-10-10 NOTE — Progress Notes (Signed)
PT Cancellation Note  Patient Details Name: KAWHI BLAKEMAN MRN: [id number] DOB: 02/23/1956   Cancelled Treatment:    Reason Eval/Treat Not Completed: Fatigue/lethargy limiting ability to participate.  Patient declined PT this pm, reports feeling fatigued following HD.   Despina Pole 10/10/2013, 5:03 PM Carita Pian. Sanjuana Kava, [place] Pager [phone]

## 2013-10-10 NOTE — Care Management Note (Signed)
   CARE MANAGEMENT NOTE 10/10/2013  Patient:  Bradley [name], Bradley [name]   Account Number:  [id number]  Date Initiated:  09/09/2013  Documentation initiated by:  [facility]  Subjective/Objective Assessment:   58 year old male admitted with sepsis.     Action/Plan:   From home.  10/09/2013 Hopefully d/c in next 24 hr. Await [place] orders.  10/10/2013 DME ordered, [facility] to provide HHRN.   Anticipated DC Date:  10/10/2013   Anticipated DC Plan:  [place]  CM consult      Choice offered to / List presented to:     DME arranged  3-N-1  Vassie Moselle      DME agency  [place] arranged  HH-1 RN      [place].   Status of service:  Completed, signed off Medicare Important Message given?  NA - LOS <3 / Initial given by admissions (If response is "NO", the following Medicare IM given date fields will be blank) Date Medicare IM given:   Date Additional Medicare IM given:    Discharge Disposition:  [place]  Per UR Regulation:  Reviewed for med. necessity/level of care/duration of stay  If discussed at [place] of Stay Meetings, dates discussed:   09/18/2013  09/23/2013  09/25/2013  09/30/2013  10/07/2013    Comments:  contact:  wife  cell:   [phone]                daughter:  [phone]  10/07/2013  [place], [place]  [phone] NCM and SW met with patient and daughter Judson Roch regarding discharge planning and care assistance at home. The daughter was a CNA and her brother are available  while the mom works (part time 3 days/wk). The family will provide the care he needs in the home.  CIR/Jenny: updated on family situation for dc planning, requested f/u  10/01/2013  [address] RN, [place] Per MD notes  reasonable to make plans for long term ESRD management,  will plan to CLIP.  CIR/Jenny called to update patient on status of renal function, NCM  to contact financial counselor to update regarding medicaid application Financial counselor/ Caryl Pina [phone]  left voice mail message to update on renal recovery   09/30/2013  [address] RN, [place] [phone] acute vs chronic renal failure, CLIP process pending determination continue IV antibiotics endocarditis, oliguric, HD started 09/17/2013,perm cath 09/25/2013  [id number] Davis,Rn,BSN,CCM: patient remain gravely ill, renal function not improving, remains responsive to some stimuli but lethargic, now on iv Precedex drip/ bnp>7000.  Encephalopathy continues.  Plan is to transfer to [place] for [place] hd if renal function deteriorates further.

## 2013-10-10 NOTE — Discharge Summary (Addendum)
Physician Discharge Summary  Bradley [name] [id number] DOB: 21-Nov-1955 DOA: 09/08/2013  PCP: [name], MD  Admit date: 09/08/2013 Discharge date: 10/10/2013  Time spent: 35 minutes  Recommendations for Outpatient Follow-up:  1. Please follow up on CBC in 1 week 2. End date for antibiotic therapy: 10/21/2013  Discharge Diagnoses:  Principal Problem:   Sepsis Active Problems:   HYPOTHYROIDISM- TSH 7.5   Aortic valve replaced- Bentall propceedure 2011   Hypertension   Depression   LBBB (left bundle branch block)   Major depressive disorder, recurrent, severe with psychotic features- hospitalized in Jan 2013   Generalized anxiety disorder   Altered mental state   Encephalitis   Septic embolism   HCAP (healthcare-associated pneumonia)   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   Acute diastolic heart failure   NSTEMI (non-ST elevated myocardial infarction)   Agitation   Acute respiratory alkalosis   Acute renal failure   Encephalopathy acute   Staphylococcus aureus bacteremia with sepsis   ST elevation (STEMI) myocardial infarction   Discharge Condition: Stable/Improved  Diet recommendation: Heart Healthy  Filed Weights   10/08/13 1533 10/10/13 0814 10/10/13 1224  Weight: 88.4 kg (194 lb 14.2 oz) 88.1 kg (194 lb 3.6 oz) 85.9 kg (189 lb 6 oz)    History of present illness:  Bradley [name] is a 58 y.o. male with Past medical history of aortic wall replacement x2 with aortic root correction in 2011, hypothyroidism, hypertension, depression.  The patient is coming from home.  The patient was brought in by his family member and the history was obtained from the family members as the patient was poor historian due to altered mental status.  As per the family the patient started having complaints of generalized weakness tiredness and fatigue that has been ongoing since last 3 weeks. The family denied any complaint of shortness of breath fever or chills or night sweats or diaphoresis  or any other symptoms during those 3 weeks. Since last Friday he started having episode of nausea vomiting and diarrhea without any blood. He was also having temperature of 101 with abdominal pain and bloating. At that time he did not have any complaint of chest pain or shortness of breath.  Patient was seen in the ED and with the diagnosis of influenza he was sent home on Tamiflu.  Patient was brought in today again since his symptoms are progressively worsening. The family denies any complaint of fall or trauma, although he had a mechanical fall a few weeks ago as per the documentation.  The patient was having poor appetite, no further episodes of vomiting or diarrhea, worsening mental status changes, and diaphoresis. He also noted some red rash all over her body. No sick contacts no recent problems no recent surgery.   Hospital Course:  58 year old male with history of aortic stenosis, status post pediatric aortic valvulotomy at age 31 followed by tissue AVR (Bentall procedure) 02/2000 with cardiac cath 02/2010 with normal coronaries, chronic LBBB, thyroid cancer post thyroidectomy, hypothyroidism, HTN, and depression, who had been generally unwell for 2-3 weeks. He was seen in the ED on 09/06/13 and assessed as influenza related vomiting, diarrhea, abdominal pain, body aches, fever and mild confusion and was ultimately discharged on Tamiflu. Patient worsened at home with progressive confusion/disorientation and was admitted to the medicine service on 09/09/2013 presenting with mental status changes, generalized weakness, malaise with initial lab work showing leukocytosis and lactic acidosis. Initial chest x-ray showed left-sided infiltrate as CT scan of the brain showing  findings consistent with scattered septic emboli. Blood cultures drawn on 09/09/2011 growing staphylococcus arteries from both sets, organism susceptible to lactams, fluoroquinolones and tetracycline. He was initially started on  broad-spectrum IV antibiotic therapy with vancomycin gentamicin and cefepime for coverage of presumed infectious endocarditis from a prostatic valve. Patient having a prolonged hospitalization. His antibiotics were changed to nafcillin and rifampin with a stop date of 10/21/2013. A hemodialysis catheter was placed 05/20/2014 and hemodialysis was initiated. The patient has remained clinically stable. AVF placement on 10/08/2013. He remains deconditioned. Family wishing to care for him at home. I discussed his antimicrobial regimen with infectious disease. PICC line cannot be placed as he is a dialysis patient. Dr Linus Salmons of infectious disease recommending continuing rifampin and changing to nafcillin to vancomycin which can be administered during hemodialysis. Vancomycin infusion will be scheduled at the dialysis clinic.     1. Sepsis, present on admission, secondary to MSSA bacteremia, infectious endocarditis, urinary tract infection. Blood cultures obtained on 09/08/2013 growing MSSA from both sites with followup cultures remaining sterile. Urinalysis obtained on 09/08/2013 growing MSSA. He had a transthoracic echocardiogram performed on 09/09/2013 that did not show evidence of vegetation. Infectious disease recommending a 6 week course of antibiotic therapy, with an end date on 10/21/2013. Transesophageal echocardiogram not recommended at this time. Patient to be discharged on Rifampin and Vancomycin with end date of 10/21/2013.   2. Infectious endocarditis. As outlined above, patient seen by cardiology and infectious disease with recommendations for a 6 week course of antibiotic therapy with rifampin and nafcillin. On discharge nafcillin was discontinued based on infectious disease recommendations, as he will be administered vancomycin during hemodialysis. As noted above, PICC line cannot be placed.   3. Acute renal failure/acute tubular necrosis. May have resulted from sepsis or perhaps gentamicin therapy.  Patient was started on hemodialysis on 09/17/2013. He underwent arteriovenous fistula creation at left arm on 09/10/2013, procedure performed by Dr. Kellie Simmering of vascular surgery. He remains on hemodialysis.  4. Toxic metabolic encephalopathy, likely secondary to sepsis, renal failure, septic brain emboli. Improving, continue supportive care. He had an MRI of brain performed on 09/09/2013 that showed multiple bilateral supratentorial and right cerebellar regions consistent with acute partially hemorrhagic infarcts likely resulted from septic emboli.  5. Non ST segment elevation myocardial infarction, patient having peak troponin 2.26, stable, seen and evaluated by cardiology during this hospitalization with recommendations for heart cath.  6. Hypertension. Blood pressures remained stable  7. Hypothyroidism. Continue Synthroid 200 mcg by mouth daily    Procedures: Arteriovenous fistula creation performed on 10/08/2013  Transthoracic echocardiogram performed on 09/09/2013 showing ejection fraction of 45-50%   Consultations: Cardiology  Nephrology  Vascular surgery  Infectious disease  Neurology  Pulmonary critical care medicine  Interventional radiology   Discharge Exam: Filed Vitals:   10/10/13 1301  BP: 123/72  Pulse: 106  Temp: 98.1 F (36.7 C)  Resp: 18    General: Pt is alert, follows commands appropriately, not in acute distress  HEENT: No icterus, No thrush, [place]/AT  Cardiovascular: RRR, S1/S2, no rubs, no gallops  Respiratory: CTA bilaterally, no wheezing, no crackles, no rhonchi  Abdomen: Soft/+BS, diffusely tender without any rebound or peritoneal signs, non distended, no guarding  Extremities: No edema, No lymphangitis, No petechiae, No rashes, no synovitis   Discharge Instructions  Discharge Orders   Future Appointments Provider Department Dept Phone   10/21/2013 3:45 PM Carlyle Basques, MD [facility] [phone]   11/11/2013  2:00  PM [facility] [phone]   11/11/2013 3:00 PM Mal [name], MD [facility] [phone]   Future Orders Complete By Expires   Call MD for:  difficulty breathing, headache or visual disturbances  As directed    Call MD for:  extreme fatigue  As directed    Call MD for:  persistant nausea and vomiting  As directed    Call MD for:  severe uncontrolled pain  As directed    Call MD for:  temperature >100.4  As directed    Diet - low sodium heart healthy  As directed    Increase activity slowly  As directed        Medication List    STOP taking these medications       aspirin 325 MG tablet  Replaced by:  aspirin 81 MG EC tablet     ibuprofen 200 MG tablet  Commonly known as:  ADVIL,MOTRIN     lisinopril 10 MG tablet  Commonly known as:  PRINIVIL,ZESTRIL     metoprolol 50 MG tablet  Commonly known as:  LOPRESSOR     oseltamivir 75 MG capsule  Commonly known as:  TAMIFLU      TAKE these medications       acetaminophen 500 MG tablet  Commonly known as:  TYLENOL  Take 500 mg by mouth every 6 (six) hours as needed for mild pain or fever.     aspirin 81 MG EC tablet  Take 1 tablet (81 mg total) by mouth daily.     clonazePAM 1 MG tablet  Commonly known as:  KLONOPIN  Take 1 tablet (1 mg total) by mouth 2 (two) times daily as needed for anxiety (insomnia or anxiety).     feeding supplement (RESOURCE BREEZE) Liqd  Take 1 Container by mouth 2 (two) times daily between meals.     levothyroxine 200 MCG tablet  Commonly known as:  SYNTHROID, LEVOTHROID  Take 1 tablet (200 mcg total) by mouth daily before breakfast.     multivitamin per tablet  Take 1 tablet by mouth daily.     oxyCODONE 5 MG immediate release tablet  Commonly known as:  Oxy IR/ROXICODONE  Take 1 tablet (5 mg total) by mouth every 6 (six) hours as needed for severe pain.     rifampin 300 MG capsule  Commonly known as:  RIFADIN   Take 2 capsules (600 mg total) by mouth daily.     sertraline 50 MG tablet  Commonly known as:  ZOLOFT  Take 50 mg by mouth daily.     sevelamer carbonate 0.8 G Pack packet  Commonly known as:  RENVELA  Take 1.6 g by mouth 3 (three) times daily with meals.       No Known Allergies     Follow-up Information   Follow up with [name], MD In 6 weeks. (Office will call you to arrange your appt (sent))    Specialty:  Vascular Surgery   Contact information:   [place] [address] [phone]       Follow up with Windy Kalata, MD In 1 week.   Specialty:  Nephrology   Contact information:   [place] [address] [phone]       Follow up with Scharlene Gloss, MD In 1 week.   Specialty:  Infectious Diseases   Contact information:   [address] [place] [address] [phone]  The results of significant diagnostics from this hospitalization (including imaging, microbiology, ancillary and laboratory) are listed below for reference.    Significant Diagnostic Studies: Dg Abd 1 View  09/17/2013   CLINICAL DATA:  Abdominal distension  EXAM: ABDOMEN - 1 VIEW  COMPARISON:  CT ABD/PELVIS W CM dated 09/06/2013  FINDINGS: There is at least moderate gas distention of multiple loops of predominantly small bowel, though there may be minimal gaseous distention of the splenic flexure of the colon.  Examination is degraded secondary to the coned field-of-view on solitary provided radiograph.  Nondiagnostic evaluation for pneumoperitoneum secondary supine positioning and exclusion of the lower thorax. No definite pneumatosis or portal venous gas.  No definite abnormal intra-abdominal calcifications.  Degenerative change the lower lumbar spine.  IMPRESSION: Degraded examination with findings concerning for small bowel obstruction though there may be a minimal amount of air within the splenic flexure of the colon. Clinical correlation is  advised. Further evaluation with complete abdominal radiographic series may be performed as clinically indicated.   Electronically Signed   By: Sandi Mariscal M.D.   On: 09/17/2013 10:38   Ct Head Wo Contrast  09/12/2013   CLINICAL DATA:  Increased mental status change, evaluate for CVA versus worsening bleed.  EXAM: CT HEAD WITHOUT CONTRAST  TECHNIQUE: Contiguous axial images were obtained from the base of the skull through the vertex without intravenous contrast.  COMPARISON:  Prior MRI and and CT from 09/09/2013.  FINDINGS: Study is degraded by motion artifact.  Vasogenic edema as trending linear hyperdensity within the left parietal lobe is stable not significantly changed relative to the previous examination. Vague hypodensities involving the supratentorial brain and right cerebellar hemisphere are not significantly changed relative to recent MRI. No definite a new intracranial infarct identified. Previously seen hyperdense foci within the mid bilateral frontal lobes are less conspicuous as compared to prior. No new intracranial hemorrhage. No mass lesion or midline shift. Ventricles are stable in size without evidence of hydrocephalus. No extra-axial fluid collection.  The calvarium is grossly intact. Orbits are normal. Paranasal sinuses and mastoid air cells remain clear.  IMPRESSION: 1. Limited study due to motion. No significant interval change in subacute left parietal infarct with small amount of associated hemorrhage. Additional multi focal infarcts involving the supratentorial brain and right cerebellar hemisphere are not significantly changed as well relative to recent MRI from 09/09/2013. No definite new intracranial infarct identified. No new intracranial hemorrhage.   Electronically Signed   By: Jeannine Boga M.D.   On: 09/12/2013 22:02   Ir Veno/ext/uni Right  09/14/2013   CLINICAL DATA:  Poor venous access, request for PICC  EXAM: DUAL LUMEN LEFT MIDLINE VENOUS CATHETER PLACEMENT WITH  ULTRASOUND AND FLUOROSCOPIC GUIDANCE  ADDITIONAL VENOGRAPHY OF THE RIGHT UPPER EXTREMITY  FLUOROSCOPY TIME:  7 MIN AND 30 SECONDS.  PROCEDURE: The patient's family member was advised of the possible risks and complications and agreed to undergo the procedure. The patient was then brought to the angiographic suite for the procedure.  The right and left arm was prepped with chlorhexidine, draped in the usual sterile fashion using maximum barrier technique (cap and mask, sterile gown, sterile gloves, large sterile sheet, hand hygiene and cutaneous antisepsis) and infiltrated locally with 1% Lidocaine.  Ultrasound demonstrated patency of the right and left brachial vein, and this was documented with an image. Under real-time ultrasound guidance, the right brachial vein was accessed with a 21 gauge micropuncture needle and image documentation was performed. A 0.018 wire was  introduced in to the vein. Over this, a dilator introducer was placed, the wire was unable to be advanced proximally so a venogram was performed with 10 cc of Omnipaque, this revealed venous extravasation. The right brachial vein site was abandoned and manual pressure was held until the site was soft with no continued bleeding. The left brachial vein was accessed with a 21 gauge micropuncture needle and multiple guidewires used to advance to the SVC/RA junction. A 5 French dual lumen power-injectable PICC was advanced, however unable to cross proximally to the lower SVC/right atrial junction, the PICC was left midline for this reason. Fluoroscopy during the procedure and fluoro spot radiograph confirms appropriate catheter position. The catheter aspirated and flushed well and was then covered with a sterile dressing.  Complications: Extravasation of 10 cc of Omnipaque to right brachial vein. Unable to advance left brachial vein PICC to the SVC/RA junction, left brachial PICC was placed midline.  IMPRESSION: Successful left arm midline VENOUS CATHETER  placement with ultrasound and fluoroscopic guidance. The catheter is ready for use.  Read By:  Tsosie Billing PA-C   Electronically Signed   By: Aletta Edouard M.D.   On: 09/14/2013 11:51   US Renal  09/15/2013   CLINICAL DATA:  Acute renal failure.  EXAM: RENAL/URINARY TRACT ULTRASOUND COMPLETE  COMPARISON:  CT abdomen and pelvis 09/06/2013.  FINDINGS: Right Kidney:  Length: Approximately 12.7 cm. No hydronephrosis. Well-preserved cortex. No shadowing calculi. Normal parenchymal echotexture without focal abnormalities.  Left Kidney:  Length: Approximately 12.5 cm. No hydronephrosis. Well-preserved cortex. No shadowing calculi. Normal parenchymal echotexture without focal abnormalities.  Bladder:  Decompressed by Foley catheter.  IMPRESSION: Normal kidneys.  Specifically, no evidence of hydronephrosis.   Electronically Signed   By: Evangeline Dakin M.D.   On: 09/15/2013 10:55   Ir Fluoro Guide Cv Line Left  09/14/2013   CLINICAL DATA:  Poor venous access, request for PICC  EXAM: DUAL LUMEN LEFT MIDLINE VENOUS CATHETER PLACEMENT WITH ULTRASOUND AND FLUOROSCOPIC GUIDANCE  ADDITIONAL VENOGRAPHY OF THE RIGHT UPPER EXTREMITY  FLUOROSCOPY TIME:  7 MIN AND 30 SECONDS.  PROCEDURE: The patient's family member was advised of the possible risks and complications and agreed to undergo the procedure. The patient was then brought to the angiographic suite for the procedure.  The right and left arm was prepped with chlorhexidine, draped in the usual sterile fashion using maximum barrier technique (cap and mask, sterile gown, sterile gloves, large sterile sheet, hand hygiene and cutaneous antisepsis) and infiltrated locally with 1% Lidocaine.  Ultrasound demonstrated patency of the right and left brachial vein, and this was documented with an image. Under real-time ultrasound guidance, the right brachial vein was accessed with a 21 gauge micropuncture needle and image documentation was performed. A 0.018 wire was introduced  in to the vein. Over this, a dilator introducer was placed, the wire was unable to be advanced proximally so a venogram was performed with 10 cc of Omnipaque, this revealed venous extravasation. The right brachial vein site was abandoned and manual pressure was held until the site was soft with no continued bleeding. The left brachial vein was accessed with a 21 gauge micropuncture needle and multiple guidewires used to advance to the SVC/RA junction. A 5 French dual lumen power-injectable PICC was advanced, however unable to cross proximally to the lower SVC/right atrial junction, the PICC was left midline for this reason. Fluoroscopy during the procedure and fluoro spot radiograph confirms appropriate catheter position. The catheter aspirated and flushed well  and was then covered with a sterile dressing.  Complications: Extravasation of 10 cc of Omnipaque to right brachial vein. Unable to advance left brachial vein PICC to the SVC/RA junction, left brachial PICC was placed midline.  IMPRESSION: Successful left arm midline VENOUS CATHETER placement with ultrasound and fluoroscopic guidance. The catheter is ready for use.  Read By:  Tsosie Billing PA-C   Electronically Signed   By: Aletta Edouard M.D.   On: 09/14/2013 11:51   Ir Fluoro Guide Cv Line Right  09/25/2013   INDICATION: Acute renal failure. Temporary catheter needs conversion to a tunneled dialysis catheter.  EXAM: CONVERSION OF NON TUNNELED CATHETER TO A TUNNELED DIALYSIS CATHETER WITH FLUOROSCOPIC GUIDANCE  Physician: Stephan Minister. Anselm Pancoast, MD  MEDICATIONS: 3 mg versed, 150 mcg fentanyl. The patient was already on scheduled antibiotics. A radiology nurse monitored the patient for moderate sedation.  ANESTHESIA/SEDATION: Moderate sedation time: 25 minutes  FLUOROSCOPY TIME:  24 seconds  PROCEDURE: Informed consent was obtained for conversion to a tunneled dialysis catheter. The existing right jugular catheter and right side of the neck was prepped and draped  in sterile fashion. Maximal barrier sterile technique was utilized including caps, mask, sterile gowns, sterile gloves, sterile drape, hand hygiene and skin antiseptic. The skin below the right clavicle was anesthetized with 1% lidocaine. A subcutaneous tract was created and a 23 cm tip to cuff HemoSplit catheter was placed through the tract to the temporary dialysis catheter site. The temporary dialysis catheter was exchanged for a peel-away sheath over a wire. The new dialysis catheter was placed through the peel-away sheath and the tip was placed in the right atrium. Both lumens aspirated and flushed well. Appropriate amount of heparin was placed in both lumens. Gelfoam was placed within the subcutaneous tract. The vein skin site and catheter exit site were sutured with 2-0 Ethilon sutures.  COMPLICATIONS: None  FINDINGS: Catheter tip in the right atrium.  IMPRESSION: Successful conversion of a temporary dialysis catheter to a tunneled dialysis catheter with fluoroscopy.   Electronically Signed   By: Markus Daft M.D.   On: 09/25/2013 16:25   Ir Fluoro Guide Cv Line Right  09/21/2013   CLINICAL DATA:  Renal failure  EXAM: IR RIGHT FLOURO GUIDE CV LINE; IR ULTRASOUND GUIDANCE VASC ACCESS RIGHT  MEDICATIONS AND MEDICAL HISTORY: None  ANESTHESIA/SEDATION: None  CONTRAST:  None  FLUOROSCOPY TIME:  18 seconds.  PROCEDURE: The procedure, risks, benefits, and alternatives were explained to the patient. Questions regarding the procedure were encouraged and answered. The patient understands and consents to the procedure.  The right neck was prepped with Betadine in a sterile fashion, and a sterile drape was applied covering the operative field. A sterile gown and sterile gloves were used for the procedure.  Under sonographic guidance, a micropuncture needle was inserted into the right internal jugular vein and removed over a 018 wire which was up sized to a 3 J. the 20 cm temporary dialysis catheter was advanced over the  wire to the right atrium. It was flushed and sewn in place.  COMPLICATIONS: None  FINDINGS: Tip of the temporary dialysis catheter is at the right atrium  IMPRESSION: Successful placement of a temporary right internal jugular vein dialysis catheter.   Electronically Signed   By: Maryclare Bean M.D.   On: 09/21/2013 12:15   Ir US Guide Vasc Access Left  09/14/2013   CLINICAL DATA:  Poor venous access, request for PICC  EXAM: DUAL LUMEN LEFT MIDLINE VENOUS CATHETER  PLACEMENT WITH ULTRASOUND AND FLUOROSCOPIC GUIDANCE  ADDITIONAL VENOGRAPHY OF THE RIGHT UPPER EXTREMITY  FLUOROSCOPY TIME:  7 MIN AND 30 SECONDS.  PROCEDURE: The patient's family member was advised of the possible risks and complications and agreed to undergo the procedure. The patient was then brought to the angiographic suite for the procedure.  The right and left arm was prepped with chlorhexidine, draped in the usual sterile fashion using maximum barrier technique (cap and mask, sterile gown, sterile gloves, large sterile sheet, hand hygiene and cutaneous antisepsis) and infiltrated locally with 1% Lidocaine.  Ultrasound demonstrated patency of the right and left brachial vein, and this was documented with an image. Under real-time ultrasound guidance, the right brachial vein was accessed with a 21 gauge micropuncture needle and image documentation was performed. A 0.018 wire was introduced in to the vein. Over this, a dilator introducer was placed, the wire was unable to be advanced proximally so a venogram was performed with 10 cc of Omnipaque, this revealed venous extravasation. The right brachial vein site was abandoned and manual pressure was held until the site was soft with no continued bleeding. The left brachial vein was accessed with a 21 gauge micropuncture needle and multiple guidewires used to advance to the SVC/RA junction. A 5 French dual lumen power-injectable PICC was advanced, however unable to cross proximally to the lower SVC/right  atrial junction, the PICC was left midline for this reason. Fluoroscopy during the procedure and fluoro spot radiograph confirms appropriate catheter position. The catheter aspirated and flushed well and was then covered with a sterile dressing.  Complications: Extravasation of 10 cc of Omnipaque to right brachial vein. Unable to advance left brachial vein PICC to the SVC/RA junction, left brachial PICC was placed midline.  IMPRESSION: Successful left arm midline VENOUS CATHETER placement with ultrasound and fluoroscopic guidance. The catheter is ready for use.  Read By:  Tsosie Billing PA-C   Electronically Signed   By: Aletta Edouard M.D.   On: 09/14/2013 11:51   Ir US Guide Vasc Access Right  09/21/2013   CLINICAL DATA:  Renal failure  EXAM: IR RIGHT FLOURO GUIDE CV LINE; IR ULTRASOUND GUIDANCE VASC ACCESS RIGHT  MEDICATIONS AND MEDICAL HISTORY: None  ANESTHESIA/SEDATION: None  CONTRAST:  None  FLUOROSCOPY TIME:  18 seconds.  PROCEDURE: The procedure, risks, benefits, and alternatives were explained to the patient. Questions regarding the procedure were encouraged and answered. The patient understands and consents to the procedure.  The right neck was prepped with Betadine in a sterile fashion, and a sterile drape was applied covering the operative field. A sterile gown and sterile gloves were used for the procedure.  Under sonographic guidance, a micropuncture needle was inserted into the right internal jugular vein and removed over a 018 wire which was up sized to a 3 J. the 20 cm temporary dialysis catheter was advanced over the wire to the right atrium. It was flushed and sewn in place.  COMPLICATIONS: None  FINDINGS: Tip of the temporary dialysis catheter is at the right atrium  IMPRESSION: Successful placement of a temporary right internal jugular vein dialysis catheter.   Electronically Signed   By: Maryclare Bean M.D.   On: 09/21/2013 12:15   Dg Chest Port 1 View  09/18/2013   CLINICAL DATA:  Respiratory  distress, shortness of breath.  EXAM: PORTABLE CHEST - 1 VIEW  COMPARISON:  09/17/2013  FINDINGS: Prior CABG and valve replacement. Left pass catheterization is in stable position. Cardiomegaly. Left lower lobe  opacity, likely atelectasis. No effusions or overt edema.  IMPRESSION: Cardiomegaly.  Left lower lobe atelectasis.   Electronically Signed   By: Rolm Baptise M.D.   On: 09/18/2013 06:27   Dg Chest Port 1 View  09/17/2013   CLINICAL DATA:  Hemodialysis catheter placement  EXAM: PORTABLE CHEST - 1 VIEW  COMPARISON:  DG CHEST 1V PORT dated 09/17/2013; DG CHEST 1V PORT dated 09/13/2013; DG CHEST 1V PORT dated 09/08/2013  FINDINGS: Grossly unchanged enlarged cardiac silhouette and mediastinal contours post median sternotomy and valve replacement. Interval placement of left jugular approach dialysis catheter with tip projected over the superior aspect of the SVC. Lung volumes remain reduced. Pulmonary vasculature remains indistinct. Grossly unchanged perihilar and bibasilar heterogeneous / consolidative opacities, left greater than right. No definite pleural effusion or pneumothorax. Grossly unchanged bones including postsurgical/traumatic deformity of the distal end of the right clavicle.  IMPRESSION: 1. Interval placement of left jugular approach central venous catheter with tip projected over the superior SVC. No pneumothorax. 2. Similar findings of pulmonary edema and perihilar/bibasilar opacities, left greater than right, atelectasis versus infiltrate.   Electronically Signed   By: Sandi Mariscal M.D.   On: 09/17/2013 10:41   Dg Chest Port 1 View  09/17/2013   CLINICAL DATA:  Confirm PICC line placement.  EXAM: PORTABLE CHEST - 1 VIEW  COMPARISON:  09/13/2013  FINDINGS: No PICC line is visualized within the chest or shoulder regions bilaterally.  There is cardiomegaly. Prior median sternotomy and valve replacement. Mild vascular congestion. Left lower lobe airspace opacity again noted, unchanged. No visible  effusions.  IMPRESSION: No visible PICC line.  Stable cardiomegaly and vascular congestion.  Stable left lower lobe atelectasis or consolidation/pneumonia.   Electronically Signed   By: Rolm Baptise M.D.   On: 09/17/2013 04:08   Dg Chest Port 1 View  09/13/2013   CLINICAL DATA:  Respiratory distress  EXAM: PORTABLE CHEST - 1 VIEW  COMPARISON:  DG CHEST 1V PORT dated 09/08/2013  FINDINGS: Evidence of CABG reidentified with moderate enlargement of the cardiac silhouette and central vascular congestion. Retrocardiac opacity persists. No overt alveolar edema or other filling processes otherwise identified. Trace if any left pleural fluid. No acute osseous finding.  IMPRESSION: Cardiomegaly with central vascular congestion and persistent retrocardiac atelectasis or possibly early pneumonia.   Electronically Signed   By: Conchita Paris M.D.   On: 09/13/2013 13:06   Dg Abd Portable 2v  09/18/2013   CLINICAL DATA:  58 year old male with abdominal distension. Possible small bowel obstruction. Initial encounter.  EXAM: PORTABLE ABDOMEN - 2 VIEW  COMPARISON:  09/17/2013 and earlier.  FINDINGS: Supine and portable cross-table lateral views of the abdomen. Decreased gaseous distension of small bowel. Numerous gas-filled mostly nondilated loops persist. Persistent right colon gas. No pneumoperitoneum identified. Mild motion artifact on both views. Stable visualized osseous structures.  IMPRESSION: Improved bowel gas pattern with decreased gaseous distension of small bowel and persistent colon gas. Pattern could reflect improving small bowel obstruction or ileus.  No free air identified.   Electronically Signed   By: Lars Pinks M.D.   On: 09/18/2013 17:50    Microbiology: Recent Results (from the past 240 hour(s))  CLOSTRIDIUM DIFFICILE BY PCR     Status: None   Collection Time    10/06/13  4:49 AM      Result Value Ref Range Status   C difficile by pcr NEGATIVE  NEGATIVE Final     Labs: Basic Metabolic  Panel:  Recent  Labs Lab 10/05/13 0520 10/08/13 0644 10/08/13 0855 10/10/13 0839  NA 133* 137 136* 137  K 4.2 3.7 3.4* 4.3  CL 93* 94*  --  96  CO2 25 25  --  26  GLUCOSE 105* 90 113* 117*  BUN 18 16  --  15  CREATININE 7.61* 7.91*  --  7.19*  CALCIUM 8.4 8.2*  --  8.1*  PHOS  --  5.9*  --  5.1*   Liver Function Tests:  Recent Labs Lab 10/08/13 0644 10/10/13 0839  ALBUMIN 1.5* 1.5*   No results found for this basename: LIPASE, AMYLASE,  in the last 168 hours No results found for this basename: AMMONIA,  in the last 168 hours CBC:  Recent Labs Lab 10/05/13 0520 10/08/13 0644 10/08/13 0855 10/10/13 0839  WBC 14.8* 13.7*  --  13.3*  HGB 7.9* 7.4* 7.1* 7.1*  HCT 22.9* 21.7* 21.0* 20.6*  MCV 88.4 89.3  --  90.7  PLT 365 354  --  316   Cardiac Enzymes: No results found for this basename: CKTOTAL, CKMB, CKMBINDEX, TROPONINI,  in the last 168 hours BNP: BNP (last 3 results)  Recent Labs  09/10/13 0753 09/14/13 0445  PROBNP 37349.0* >70000.0*   CBG: No results found for this basename: GLUCAP,  in the last 168 hours     Signed:  Kelvin Cellar  [facility] 10/10/2013, 2:37 PM

## 2013-10-10 NOTE — Progress Notes (Signed)
Patient ready for discharge.  He has ESRD on dialysis with probable PVE with septic emboli.  Unable to continue with nafcillin at discharge with no access. He can be changed to vancomycin which can be administered after HD and continue through March 24th.  Also continue with rifampin.  We will follow up with him on or around 3/24 to see if further antibiotics needed.    Thanks Scharlene Gloss, MD

## 2013-10-11 LAB — TYPE AND SCREEN
ABO/RH(D): A NEG
Antibody Screen: NEGATIVE
Unit division: 0
Unit division: 0

## 2013-10-20 ENCOUNTER — Telehealth: Payer: Self-pay | Admitting: Family Medicine

## 2013-10-20 MED ORDER — CLONAZEPAM 1 MG PO TABS: 1.0000 mg | ORAL_TABLET | Freq: Two times a day (BID) | ORAL | Status: DC | PRN

## 2013-10-20 NOTE — Telephone Encounter (Signed)
Last visit 05/07/13 Last refill 10/10/13 #15 0 refill

## 2013-10-20 NOTE — Telephone Encounter (Signed)
Refill enough until then.

## 2013-10-20 NOTE — Telephone Encounter (Signed)
Pt was dc'd from [facility] 3/13 and was rx a new med clonazePAM (KLONOPIN) 1 MG tablet  (bid) Pt only got 15 (BID)  Advised pt needs post hosp fup.  Wife states pt is still very weak and has dialysis 3 x wk. Pt would like a refill of this med. Advised pt needs post hosp fup.  appt sch 3/30 at 11:15. Can you send enough to get pt through until appt.? [facility] [address] Pt needs today due to being out.  Please.

## 2013-10-20 NOTE — Telephone Encounter (Signed)
Rx called in to pharmacy. 

## 2013-10-21 ENCOUNTER — Inpatient Hospital Stay: Payer: Self-pay | Admitting: Internal Medicine

## 2013-10-22 ENCOUNTER — Inpatient Hospital Stay (HOSPITAL_COMMUNITY)
Admission: AD | Admit: 2013-10-22 | Discharge: 2013-11-13 | DRG: 219 | Disposition: A | Discharge status: Home Health | Payer: Medicaid Other | Source: Ambulatory Visit | Attending: Cardiothoracic Surgery | Admitting: Cardiothoracic Surgery

## 2013-10-22 ENCOUNTER — Ambulatory Visit (INDEPENDENT_AMBULATORY_CARE_PROVIDER_SITE_OTHER): Payer: Medicaid Other | Admitting: Infectious Disease

## 2013-10-22 ENCOUNTER — Encounter (HOSPITAL_COMMUNITY): Payer: Self-pay | Admitting: General Practice

## 2013-10-22 ENCOUNTER — Encounter: Payer: Self-pay | Admitting: Infectious Disease

## 2013-10-22 ENCOUNTER — Inpatient Hospital Stay (HOSPITAL_COMMUNITY): Payer: Medicaid Other

## 2013-10-22 VITALS — BP 136/90 | HR 88 | Temp 98.5°F | Wt 192.0 lb

## 2013-10-22 DIAGNOSIS — I669 Occlusion and stenosis of unspecified cerebral artery: Secondary | ICD-10-CM

## 2013-10-22 DIAGNOSIS — N170 Acute kidney failure with tubular necrosis: Secondary | ICD-10-CM | POA: Diagnosis not present

## 2013-10-22 DIAGNOSIS — D62 Acute posthemorrhagic anemia: Secondary | ICD-10-CM | POA: Diagnosis not present

## 2013-10-22 DIAGNOSIS — R451 Restlessness and agitation: Secondary | ICD-10-CM

## 2013-10-22 DIAGNOSIS — F339 Major depressive disorder, recurrent, unspecified: Secondary | ICD-10-CM | POA: Diagnosis present

## 2013-10-22 DIAGNOSIS — I213 ST elevation (STEMI) myocardial infarction of unspecified site: Secondary | ICD-10-CM

## 2013-10-22 DIAGNOSIS — B9561 Methicillin susceptible Staphylococcus aureus infection as the cause of diseases classified elsewhere: Secondary | ICD-10-CM | POA: Insufficient documentation

## 2013-10-22 DIAGNOSIS — I120 Hypertensive chronic kidney disease with stage 5 chronic kidney disease or end stage renal disease: Secondary | ICD-10-CM | POA: Diagnosis present

## 2013-10-22 DIAGNOSIS — M009 Pyogenic arthritis, unspecified: Secondary | ICD-10-CM | POA: Diagnosis present

## 2013-10-22 DIAGNOSIS — R4182 Altered mental status, unspecified: Secondary | ICD-10-CM

## 2013-10-22 DIAGNOSIS — F3289 Other specified depressive episodes: Secondary | ICD-10-CM

## 2013-10-22 DIAGNOSIS — J9600 Acute respiratory failure, unspecified whether with hypoxia or hypercapnia: Secondary | ICD-10-CM | POA: Diagnosis not present

## 2013-10-22 DIAGNOSIS — I4901 Ventricular fibrillation: Secondary | ICD-10-CM | POA: Diagnosis not present

## 2013-10-22 DIAGNOSIS — Z992 Dependence on renal dialysis: Secondary | ICD-10-CM

## 2013-10-22 DIAGNOSIS — F411 Generalized anxiety disorder: Secondary | ICD-10-CM

## 2013-10-22 DIAGNOSIS — N186 End stage renal disease: Secondary | ICD-10-CM

## 2013-10-22 DIAGNOSIS — R7881 Bacteremia: Secondary | ICD-10-CM

## 2013-10-22 DIAGNOSIS — I214 Non-ST elevation (NSTEMI) myocardial infarction: Secondary | ICD-10-CM

## 2013-10-22 DIAGNOSIS — E890 Postprocedural hypothyroidism: Secondary | ICD-10-CM

## 2013-10-22 DIAGNOSIS — E873 Alkalosis: Secondary | ICD-10-CM

## 2013-10-22 DIAGNOSIS — F4310 Post-traumatic stress disorder, unspecified: Secondary | ICD-10-CM | POA: Diagnosis present

## 2013-10-22 DIAGNOSIS — Z954 Presence of other heart-valve replacement: Secondary | ICD-10-CM

## 2013-10-22 DIAGNOSIS — F329 Major depressive disorder, single episode, unspecified: Secondary | ICD-10-CM

## 2013-10-22 DIAGNOSIS — G0490 Encephalitis and encephalomyelitis, unspecified: Secondary | ICD-10-CM

## 2013-10-22 DIAGNOSIS — R079 Chest pain, unspecified: Secondary | ICD-10-CM

## 2013-10-22 DIAGNOSIS — I5031 Acute diastolic (congestive) heart failure: Secondary | ICD-10-CM

## 2013-10-22 DIAGNOSIS — I447 Left bundle-branch block, unspecified: Secondary | ICD-10-CM

## 2013-10-22 DIAGNOSIS — I76 Septic arterial embolism: Secondary | ICD-10-CM

## 2013-10-22 DIAGNOSIS — Z7982 Long term (current) use of aspirin: Secondary | ICD-10-CM

## 2013-10-22 DIAGNOSIS — IMO0002 Reserved for concepts with insufficient information to code with codable children: Secondary | ICD-10-CM | POA: Diagnosis present

## 2013-10-22 DIAGNOSIS — I953 Hypotension of hemodialysis: Secondary | ICD-10-CM | POA: Diagnosis not present

## 2013-10-22 DIAGNOSIS — R8281 Pyuria: Secondary | ICD-10-CM

## 2013-10-22 DIAGNOSIS — A4901 Methicillin susceptible Staphylococcus aureus infection, unspecified site: Secondary | ICD-10-CM

## 2013-10-22 DIAGNOSIS — T827XXA Infection and inflammatory reaction due to other cardiac and vascular devices, implants and grafts, initial encounter: Principal | ICD-10-CM | POA: Diagnosis present

## 2013-10-22 DIAGNOSIS — A4101 Sepsis due to Methicillin susceptible Staphylococcus aureus: Secondary | ICD-10-CM

## 2013-10-22 DIAGNOSIS — F333 Major depressive disorder, recurrent, severe with psychotic symptoms: Secondary | ICD-10-CM

## 2013-10-22 DIAGNOSIS — Z8249 Family history of ischemic heart disease and other diseases of the circulatory system: Secondary | ICD-10-CM

## 2013-10-22 DIAGNOSIS — G9340 Encephalopathy, unspecified: Secondary | ICD-10-CM

## 2013-10-22 DIAGNOSIS — I5189 Other ill-defined heart diseases: Secondary | ICD-10-CM

## 2013-10-22 DIAGNOSIS — Y831 Surgical operation with implant of artificial internal device as the cause of abnormal reaction of the patient, or of later complication, without mention of misadventure at the time of the procedure: Secondary | ICD-10-CM | POA: Diagnosis present

## 2013-10-22 DIAGNOSIS — Z952 Presence of prosthetic heart valve: Secondary | ICD-10-CM

## 2013-10-22 DIAGNOSIS — E875 Hyperkalemia: Secondary | ICD-10-CM | POA: Diagnosis present

## 2013-10-22 DIAGNOSIS — Z885 Allergy status to narcotic agent status: Secondary | ICD-10-CM

## 2013-10-22 DIAGNOSIS — I38 Endocarditis, valve unspecified: Secondary | ICD-10-CM

## 2013-10-22 DIAGNOSIS — N179 Acute kidney failure, unspecified: Secondary | ICD-10-CM

## 2013-10-22 DIAGNOSIS — I252 Old myocardial infarction: Secondary | ICD-10-CM

## 2013-10-22 DIAGNOSIS — I509 Heart failure, unspecified: Secondary | ICD-10-CM

## 2013-10-22 DIAGNOSIS — M79629 Pain in unspecified upper arm: Secondary | ICD-10-CM

## 2013-10-22 DIAGNOSIS — Z85850 Personal history of malignant neoplasm of thyroid: Secondary | ICD-10-CM

## 2013-10-22 DIAGNOSIS — A419 Sepsis, unspecified organism: Secondary | ICD-10-CM

## 2013-10-22 DIAGNOSIS — E41 Nutritional marasmus: Secondary | ICD-10-CM | POA: Diagnosis present

## 2013-10-22 DIAGNOSIS — F32A Depression, unspecified: Secondary | ICD-10-CM

## 2013-10-22 DIAGNOSIS — I498 Other specified cardiac arrhythmias: Secondary | ICD-10-CM | POA: Diagnosis present

## 2013-10-22 DIAGNOSIS — F4323 Adjustment disorder with mixed anxiety and depressed mood: Secondary | ICD-10-CM

## 2013-10-22 DIAGNOSIS — R0789 Other chest pain: Secondary | ICD-10-CM

## 2013-10-22 DIAGNOSIS — I330 Acute and subacute infective endocarditis: Secondary | ICD-10-CM

## 2013-10-22 DIAGNOSIS — M79609 Pain in unspecified limb: Secondary | ICD-10-CM

## 2013-10-22 DIAGNOSIS — T826XXA Infection and inflammatory reaction due to cardiac valve prosthesis, initial encounter: Secondary | ICD-10-CM | POA: Insufficient documentation

## 2013-10-22 DIAGNOSIS — R339 Retention of urine, unspecified: Secondary | ICD-10-CM | POA: Diagnosis not present

## 2013-10-22 DIAGNOSIS — I359 Nonrheumatic aortic valve disorder, unspecified: Secondary | ICD-10-CM

## 2013-10-22 DIAGNOSIS — N2581 Secondary hyperparathyroidism of renal origin: Secondary | ICD-10-CM | POA: Diagnosis present

## 2013-10-22 DIAGNOSIS — R600 Localized edema: Secondary | ICD-10-CM

## 2013-10-22 DIAGNOSIS — I5042 Chronic combined systolic (congestive) and diastolic (congestive) heart failure: Secondary | ICD-10-CM

## 2013-10-22 DIAGNOSIS — M00819 Arthritis due to other bacteria, unspecified shoulder: Secondary | ICD-10-CM

## 2013-10-22 HISTORY — DX: Malignant neoplasm of thyroid gland: C73

## 2013-10-22 LAB — BASIC METABOLIC PANEL
BUN: 13 mg/dL (ref 6–23)
CO2: 25 mEq/L (ref 19–32)
Calcium: 8.7 mg/dL (ref 8.4–10.5)
Chloride: 96 mEq/L (ref 96–112)
Creatinine, Ser: 4.19 mg/dL — ABNORMAL HIGH (ref 0.50–1.35)
GFR calc Af Amer: 17 mL/min — ABNORMAL LOW (ref >90)
GFR calc non Af Amer: 15 mL/min — ABNORMAL LOW (ref >90)
Glucose, Bld: 116 mg/dL — ABNORMAL HIGH (ref 70–99)
Potassium: 3.7 mEq/L (ref 3.7–5.3)
Sodium: 135 mEq/L — ABNORMAL LOW (ref 137–147)

## 2013-10-22 LAB — CBC
HCT: 30.1 % — ABNORMAL LOW (ref 39.0–52.0)
Hemoglobin: 10 g/dL — ABNORMAL LOW (ref 13.0–17.0)
MCH: 30.4 pg (ref 26.0–34.0)
MCHC: 33.2 g/dL (ref 30.0–36.0)
MCV: 91.5 fL (ref 78.0–100.0)
Platelets: 258 10*3/uL (ref 150–400)
RBC: 3.29 MIL/uL — ABNORMAL LOW (ref 4.22–5.81)
RDW: 19.4 % — ABNORMAL HIGH (ref 11.5–15.5)
WBC: 13.1 10*3/uL — ABNORMAL HIGH (ref 4.0–10.5)

## 2013-10-22 MED ORDER — GI COCKTAIL ~~LOC~~
30.0000 mL | Freq: Once | ORAL | Status: AC
  Administered 2013-10-22: 30 mL via ORAL
  Filled 2013-10-22: qty 30

## 2013-10-22 MED ORDER — CLONAZEPAM 1 MG PO TABS
1.0000 mg | ORAL_TABLET | Freq: Two times a day (BID) | ORAL | Status: DC | PRN
  Administered 2013-10-22 – 2013-10-25 (×5): 1 mg via ORAL
  Filled 2013-10-22 (×7): qty 1

## 2013-10-22 MED ORDER — SERTRALINE HCL 50 MG PO TABS
50.0000 mg | ORAL_TABLET | Freq: Every day | ORAL | Status: DC
  Administered 2013-10-23 – 2013-10-27 (×5): 50 mg via ORAL
  Filled 2013-10-22 (×6): qty 1

## 2013-10-22 MED ORDER — ACETAMINOPHEN 325 MG PO TABS
650.0000 mg | ORAL_TABLET | Freq: Four times a day (QID) | ORAL | Status: DC | PRN
  Administered 2013-10-23 – 2013-10-27 (×3): 650 mg via ORAL
  Filled 2013-10-22 (×3): qty 2

## 2013-10-22 MED ORDER — ONDANSETRON HCL 4 MG PO TABS: 4.0000 mg | ORAL_TABLET | Freq: Four times a day (QID) | ORAL | Status: DC | PRN

## 2013-10-22 MED ORDER — CEFAZOLIN SODIUM-DEXTROSE 2-3 GM-% IV SOLR
2.0000 g | INTRAVENOUS | Status: DC
Start: 2013-10-22 — End: 2013-10-28
  Administered 2013-10-23 – 2013-10-28 (×5): 2 g via INTRAVENOUS
  Filled 2013-10-22 (×6): qty 50

## 2013-10-22 MED ORDER — ENSURE COMPLETE PO LIQD
237.0000 mL | Freq: Two times a day (BID) | ORAL | Status: DC
  Administered 2013-10-23 – 2013-10-27 (×5): 237 mL via ORAL

## 2013-10-22 MED ORDER — CEFAZOLIN SODIUM-DEXTROSE 2-3 GM-% IV SOLR: 2.0000 g | Freq: Once | INTRAVENOUS | Status: DC

## 2013-10-22 MED ORDER — ENSURE PLUS PO LIQD: 237.0000 mL | Freq: Two times a day (BID) | ORAL | Status: DC

## 2013-10-22 MED ORDER — ONDANSETRON HCL 4 MG/2ML IJ SOLN: 4.0000 mg | Freq: Four times a day (QID) | INTRAMUSCULAR | Status: DC | PRN

## 2013-10-22 MED ORDER — CEFAZOLIN SODIUM 1-5 GM-% IV SOLN: 1.0000 g | Freq: Once | INTRAVENOUS | Status: DC

## 2013-10-22 MED ORDER — ACETAMINOPHEN 650 MG RE SUPP
650.0000 mg | Freq: Four times a day (QID) | RECTAL | Status: DC | PRN
  Filled 2013-10-22: qty 1

## 2013-10-22 MED ORDER — CALCIUM CARBONATE ANTACID 500 MG PO CHEW
400.0000 mg | CHEWABLE_TABLET | Freq: Three times a day (TID) | ORAL | Status: DC | PRN
  Administered 2013-10-22 – 2013-10-23 (×2): 400 mg via ORAL
  Filled 2013-10-22 (×2): qty 2

## 2013-10-22 MED ORDER — LEVOTHYROXINE SODIUM 200 MCG PO TABS
200.0000 ug | ORAL_TABLET | Freq: Every day | ORAL | Status: DC
  Administered 2013-10-23 – 2013-10-29 (×6): 200 ug via ORAL
  Filled 2013-10-22 (×11): qty 1

## 2013-10-22 MED ORDER — ASPIRIN EC 81 MG PO TBEC
81.0000 mg | DELAYED_RELEASE_TABLET | ORAL | Status: DC
  Administered 2013-10-24 – 2013-10-26 (×2): 81 mg via ORAL
  Filled 2013-10-22 (×3): qty 1

## 2013-10-22 MED ORDER — HEPARIN SODIUM (PORCINE) 5000 UNIT/ML IJ SOLN
5000.0000 [IU] | Freq: Three times a day (TID) | INTRAMUSCULAR | Status: DC
  Administered 2013-10-23 – 2013-10-28 (×11): 5000 [IU] via SUBCUTANEOUS
  Filled 2013-10-22 (×20): qty 1

## 2013-10-22 MED ORDER — SEVELAMER CARBONATE 800 MG PO TABS
800.0000 mg | ORAL_TABLET | Freq: Three times a day (TID) | ORAL | Status: DC
  Administered 2013-10-22: 800 mg via ORAL
  Administered 2013-10-23 (×2): 1600 mg via ORAL
  Administered 2013-10-24: 800 mg via ORAL
  Administered 2013-10-24: 1600 mg via ORAL
  Administered 2013-10-25: 800 mg via ORAL
  Administered 2013-10-26: 1600 mg via ORAL
  Administered 2013-10-26 – 2013-10-27 (×2): 800 mg via ORAL
  Filled 2013-10-22 (×20): qty 2

## 2013-10-22 NOTE — H&P (Signed)
[facility] History and Physical  OLNEY LAFAZIA [id number] DOB: 1955/12/11 DOA: 10/22/2013   PCP: [name], MD   Chief Complaint: sternal pain  HPI:  58 year old male with history of aortic stenosis, status post pediatric aortic valvulotomy at age 34 followed by tissue AVR 02/2000 with cardiac cath 02/2010 with normal coronaries, chronic LBBB, thyroid cancer post thyroidectomy >> hypothyroidism, HTN, and depression,. The patient was recently discharged from the hospital on 10/10/2013 after a one month stay for  sepsis, MSSA bacteremia septic brain emboli and presumptive prosthetic valve infective endocarditis. The patient was previously followed by infectious disease. The patient was on nafcillin and rifampin for the majority of the hospitalization, but was switched to vancomycin and rifampin on 10/10/2013 due to ease of administration on dialysis. His last day of antibiotics was  10/21/2013. A hemodialysis catheter was placed 09/20/2013 and hemodialysis was initiated.  He had AVF placement on 10/08/2013.  The patient followed up at Dr. Derek Mound office today with complaints of left-sided sternoclavicular tenderness that began on 10/17/2013. The patient noted some redness at that point which had worsened over the weekend and was noted on dialysis on 10/21/2013. He was seen in the ID office today and directly admitted to the hospital because of concerns for septic arthritis and sternal infection/osteomyelitis seeding from his previous bacteremia. The patient denies any new injuries. He had subjective chills without any frank fevers. He has had chronic abdominal cramping and loose stools most likely from his rifampin. He ha numerous C. difficile PCR tests during his last admission which were negative. He has been tolerating his diet without any vomiting although he has some nausea. There has not been any hematochezia or melena. There's been no headaches, visual disturbance, focal extremity  weakness. He also complained of some shortness of breath, but this was not significantly worse than at the time of discharge. There is no hemoptysis or coughing. He does complain of sternal chest discomfort that is somewhat worse with coughing and deep inspiration. Assessment/Plan: Chest discomfort/sternoclavicular tenderness -Concerned about septic arthritis at the s.c. joint as well as the sternal manubrial joint -Also concerned about underlying sternal osteomyelitis -CT chest with contrast  -I have spoken with nephrology regarding using IV contrast -The patient was declared ESRD during his last admission in the hospital--> IV contrast will be given tonight with dialysis tomorrow -EKG -low suspicion of cardiac etiology as this is completely reproducible on physical exam -Blood cultures x2 sets -Start the patient on cefazolin 2 g with each dialysis MSSA bacteremia w/ sepsis / infective endocarditis w/ cerebral septic emboli  -Finished a 6 week course of antibiotics 10/21/2013 -Blood cultures x2 sets -hx of bioprosthetic [id number] Toxic metabolic encephalopathy  -Although not yet at baseline, mentation is significantly improved from his last admission Acute Renal Failure/Acute tubular necrosis-->ESRD  -I have consulted nephrology for maintenance dialysis -AVF placed on 10/08/13 Chronic Abdominal pain/diarrhea  -C diff pcr Chronic Systolic HF and Chronic grade 1 Diastolic CHF  -ECHO Oct [id number] with EF 60-65% with severe LVH and no RWMA  -ECHO Sep 09, 2013 EF 45-50% with septal and apical hypokinesis  Depression -continue zoloft       Past Medical History  Diagnosis Date  . Anxiety   . Depression   . History of thyroid cancer   . Hypertension   . Hypothyroidism, postsurgical   . Aortic stenosis     s/p Bentall with bioprosthetic AVR 02/2010; Last echo (9/11): Moderate LVH, EF 45-50%, AVR functioning appropriately, aortic  valve mean gradient 21, diastolic dysfunction. Chest MRA  (2/13): Mild to moderate dilatation at the sinus of Valsalva at 4.1 cm, mild dilatation ascending aorta distal to the tube graft at 3.9 cm, moderate dilatation of the innominate artery a 2.1 cm;    . Hx of cardiac cath     a. LHC in 02/2010: normal cors  . Hx of echocardiogram 2014    Echo (10/14): Severe LVH, EF 60-65%, normal wall motion, grade 1 diastolic dysfunction, AVR functioning normally, mild aortic stenosis (mean 19), moderately dilated aorta, mild LAE, mild RVE  . Staphylococcus aureus bacteremia   . Prosthetic valve endocarditis   . ESRD on dialysis    Past Surgical History  Procedure Laterality Date  . Cardiac catheterization  03/02/2010    NORMAL CORONARY ARTERY  . Sternotomy      REDO  . Transthoracic echocardiogram  03/2010    SHOWED MILD REDUCTION OF LV FUNCTION  . Thyroidectomy    . Rotator cuff repair  2012    Right  . Av fistula placement Left 10/08/2013    Procedure: ARTERIOVENOUS (AV) FISTULA CREATION- LEFT ARM; Radial Cephalic ;  Surgeon: Mal [name], MD;  Location: [facility];  Service: Vascular;  Laterality: Left;   Social History:  reports that he has never smoked. He has never used smokeless tobacco. He reports that he does not drink alcohol or use illicit drugs.   Family History  Problem Relation Age of Onset  . Heart disease Father      Allergies  Allergen Reactions  . Oxycodone     Gives patient nightmares  . Rifampin Nausea Only      Prior to Admission medications   Medication Sig Start Date End Date Taking? Authorizing Provider  acetaminophen (TYLENOL) 500 MG tablet Take 500 mg by mouth every 6 (six) hours as needed for mild pain or fever.   Yes Historical Provider, MD  aspirin EC 81 MG tablet Take 81 mg by mouth every other day.   Yes Historical Provider, MD  clonazePAM (KLONOPIN) 1 MG tablet Take 1 mg by mouth 2 (two) times daily as needed for anxiety.   Yes Historical Provider, MD  ENSURE PLUS (ENSURE PLUS) LIQD Take 237 mLs by mouth 2 (two)  times daily between meals.   Yes Historical Provider, MD  levothyroxine (SYNTHROID, LEVOTHROID) 200 MCG tablet Take 200 mcg by mouth daily before breakfast.   Yes Historical Provider, MD  rifampin (RIFADIN) 300 MG capsule Take 600 mg by mouth daily.   Yes Historical Provider, MD  sertraline (ZOLOFT) 50 MG tablet Take 50 mg by mouth daily.   Yes Historical Provider, MD  sevelamer carbonate (RENVELA) 800 MG tablet Take 800-1,600 mg by mouth 3 (three) times daily with meals. Takes 1600mg  with larger amounts of food and 800mg  with smaller amounts of food   Yes Historical Provider, MD    Review of Systems:  Constitutional:  No weight loss, night sweats, Fevers, chills Head&Eyes: No headache.  No vision loss.  No eye pain or scotoma ENT:  No Difficulty swallowing,Tooth/dental problems,Sore throat,  No ear ache, post nasal drip,  Cardio-vascular:  No  Orthopnea, PND, swelling in lower extremities,  dizziness, palpitations  GI:  No   vomiting,loss of appetite, hematochezia, melena, heartburn, indigestion, Resp:  No shortness of breath with exertion or at rest. No cough. No coughing up of blood .No wheezing.No chest wall deformity  Skin:  no rash or lesions.  GU:  no dysuria, change in  color of urine, no urgency or frequency. No flank pain.  Musculoskeletal:  No joint pain or swelling. No decreased range of motion. No back pain.  Psych:  No change in mood or affect.  Neurologic: No headache, no dysesthesia, no focal weakness, no vision loss. No syncope  Physical Exam: Filed Vitals:   10/22/13 1826  BP: 123/79  Pulse: 90  Temp: 98.5 F (36.9 C)  TempSrc: Oral  Resp: 20  Height: 6' 1.5" (1.867 m)  Weight: 84.414 kg (186 lb 1.6 oz)  SpO2: 98%   General:  A&O x 3, NAD, nontoxic, pleasant/cooperative Head/Eye: No conjunctival hemorrhage, no icterus, [place]/AT, No nystagmus ENT:  No icterus,  No thrush, good dentition, no pharyngeal exudate Neck:  No masses, no lymphadenpathy, no  bruits CV:  RRR, no rub, no gallop, no S3 Lung:  CTAB, good air movement, no wheeze, no rhonchi Abdomen: soft/diffusely tender without any guarding or rebound, +BS, nondistended, no peritoneal signs Ext: No cyanosis, No rashes, No petechiae, No lymphangitis, No edema; no splinter hemorrhages   Labs on Admission:  Basic Metabolic Panel: No results found for this basename: NA, K, CL, CO2, GLUCOSE, BUN, CREATININE, CALCIUM, MG, PHOS,  in the last 168 hours Liver Function Tests: No results found for this basename: AST, ALT, ALKPHOS, BILITOT, PROT, ALBUMIN,  in the last 168 hours No results found for this basename: LIPASE, AMYLASE,  in the last 168 hours No results found for this basename: AMMONIA,  in the last 168 hours CBC: No results found for this basename: WBC, NEUTROABS, HGB, HCT, MCV, PLT,  in the last 168 hours Cardiac Enzymes: No results found for this basename: CKTOTAL, CKMB, CKMBINDEX, TROPONINI,  in the last 168 hours BNP: No components found with this basename: POCBNP,  CBG: No results found for this basename: GLUCAP,  in the last 168 hours  Radiological Exams on Admission: No results found.  EKG: Independently reviewed. Sinus rhythm, incomplete LBBB    Time spent: 70 minutes Code Status:   FULL Family Communication:   Wife ;and son at bedside   Selisa Tensley, DO  [facility] Pager [phone]  If 7PM-7AM, please contact night-coverage [email or web address] Password [email or web address] 10/22/2013, 7:10 PM

## 2013-10-22 NOTE — Progress Notes (Signed)
Pt has fistula on left arm with old incision visible. Also pt has upper right chest cath for dialysis.

## 2013-10-22 NOTE — Progress Notes (Signed)
MEKAEL ANGELOPOULOS [id number] Code Status: FUll   Admission Data: 10/22/2013 6:21 PM Attending Provider:   [id number] W, MD Consults/ Treatment Team:    DESHON VERHOEVEN is a 58 y.o. male patient admitted from ED awake, alert - oriented  X 3 - no acute distress noted.  VSS - There were no vitals taken for this visit.  no c/o shortness of breath, no c/o chest pain. .  Allergies:  No Known Allergies   Past Medical History  Diagnosis Date  . Anxiety   . Depression   . History of thyroid cancer   . Hypertension   . Hypothyroidism, postsurgical   . Aortic stenosis     s/p Bentall with bioprosthetic AVR 02/2010; Last echo (9/11): Moderate LVH, EF 45-50%, AVR functioning appropriately, aortic valve mean gradient 21, diastolic dysfunction. Chest MRA (2/13): Mild to moderate dilatation at the sinus of Valsalva at 4.1 cm, mild dilatation ascending aorta distal to the tube graft at 3.9 cm, moderate dilatation of the innominate artery a 2.1 cm;    . Hx of cardiac cath     a. LHC in 02/2010: normal cors  . Hx of echocardiogram 2014    Echo (10/14): Severe LVH, EF 60-65%, normal wall motion, grade 1 diastolic dysfunction, AVR functioning normally, mild aortic stenosis (mean 19), moderately dilated aorta, mild LAE, mild RVE  . Staphylococcus aureus bacteremia   . Prosthetic valve endocarditis   . ESRD on dialysis    Medications Prior to Admission  Medication Sig Dispense Refill  . acetaminophen (TYLENOL) 500 MG tablet Take 500 mg by mouth every 6 (six) hours as needed for mild pain or fever.      Marland Kitchen aspirin EC 81 MG EC tablet Take 1 tablet (81 mg total) by mouth daily.  30 tablet  1  . clonazePAM (KLONOPIN) 1 MG tablet Take 1 tablet (1 mg total) by mouth 2 (two) times daily as needed for anxiety (insomnia or anxiety).  15 tablet  0  . feeding supplement, RESOURCE BREEZE, (RESOURCE BREEZE) LIQD Take 1 Container by mouth 2 (two) times daily between meals.  30 Container  0  . levothyroxine  (SYNTHROID, LEVOTHROID) 200 MCG tablet Take 1 tablet (200 mcg total) by mouth daily before breakfast.  30 tablet  1  . multivitamin (THERAGRAN) per tablet Take 1 tablet by mouth daily.        Marland Kitchen oxyCODONE (OXY IR/ROXICODONE) 5 MG immediate release tablet Take 1 tablet (5 mg total) by mouth every 6 (six) hours as needed for severe pain.  10 tablet  0  . sertraline (ZOLOFT) 50 MG tablet Take 50 mg by mouth daily.      . sevelamer carbonate (RENVELA) 0.8 G PACK packet Take 1.6 g by mouth 3 (three) times daily with meals.  270 each  1   History:  obtained from the patient. Tobacco/alcohol: denied none  Orientation to room, and floor completed with information packet given to patient/family.  Admission INP armband ID verified with patient/family, and in place.   SR up x 2, fall assessment complete, with patient and family able to verbalize understanding of risk associated with falls, and verbalized understanding to call nsg before up out of bed.  Call light within reach, patient able to voice, and demonstrate understanding.  Skin, clean-dry- intact without evidence of bruising, or skin tears.   No evidence of skin break down noted on exam.     Will cont to eval and treat per MD orders.  Henriette Combs, [place] 10/22/2013 6:21 PM

## 2013-10-22 NOTE — Progress Notes (Signed)
Subjective:    Patient ID: Bradley [name], male    DOB: Dec 07, 1955, 58 y.o.   MRN: [id number]  HPI  58 year old man with hx of prosthetic aortic valve replacement in 2008 and with aortic root correction in 2011 who was admitted on February 10th with severe MSSA bacteremia and endocarditis with septic emboli to brain. He was initally broadly treated then appropriately narrowed to nafcillin, gentamicin and rifampin. His blood cultures  Cleared on 2/11/5. He unfortunately developed renal failure and gentamicin was held. He was treated with IV nafcillin and IV rifampin 300 mg IV q 8 hours  thru end of February when rifampin changed to 600mg  daily. He was ultimately DC with vancomycin and oral rifampin because he lost IV access besided HD site. HE has AV fistula created in left forearm for HD. He had dose of vanocomcyin yesterday.  He is having trouble tolerating the oral rifampin which is causing his nausea, dyspepsia and bloating.   ON close questioning he has had pain in his chest with deep breathing and on exam he has swollen Interior joint and tenderness in [place] joint and sternum to palpation, making me concerned for Hudson septic arthritis and even potential sternal infection.  I am admitting him to [facility] to Dr. Coralyn Pear so his [place] and chest can be imaged with CT scan and CT Surgery consult obtained. He is suffering from severe depression, anxiety, insomnia and PTSD like ssx related to his recent severe illness.  Review of Systems  Constitutional: Positive for activity change, appetite change and fatigue. Negative for fever, chills, diaphoresis and unexpected weight change.  HENT: Negative for congestion, sneezing, sore throat and trouble swallowing.   Eyes: Negative for photophobia and visual disturbance.  Respiratory: Positive for shortness of breath. Negative for cough, chest tightness, wheezing and stridor.   Cardiovascular: Positive for chest pain. Negative for palpitations and leg swelling.    Gastrointestinal: Positive for nausea, abdominal pain and abdominal distention. Negative for vomiting, diarrhea and anal bleeding.  Genitourinary: Negative for dysuria, hematuria and flank pain.  Musculoskeletal: Positive for arthralgias, back pain, joint swelling and myalgias. Negative for gait problem.  Skin: Negative for color change, pallor, rash and wound.  Neurological: Negative for dizziness, tremors, weakness and light-headedness.  Hematological: Negative for adenopathy. Does not bruise/bleed easily.  Psychiatric/Behavioral: Positive for dysphoric mood and decreased concentration. Negative for suicidal ideas, behavioral problems, confusion, sleep disturbance, self-injury and agitation. The patient is nervous/anxious.        Objective:   Physical Exam  Nursing note and vitals reviewed. Constitutional: He is oriented to person, place, and time. He appears well-developed and well-nourished. No distress.  HENT:  Head: Normocephalic and atraumatic.  Mouth/Throat: Oropharynx is clear and moist.  Eyes: Conjunctivae and EOM are normal. No scleral icterus.  Neck: Normal range of motion. Neck supple. No JVD present.  Cardiovascular: Normal rate and regular rhythm.  Exam reveals no friction rub.   Murmur heard. Pulmonary/Chest: Effort normal and breath sounds normal. No respiratory distress. He has no wheezes. He exhibits tenderness.    Abdominal: Soft. Bowel sounds are normal. He exhibits no distension. There is no tenderness. There is no rebound.  Musculoskeletal: Normal range of motion. He exhibits no edema and no tenderness.  Lymphadenopathy:    He has no cervical adenopathy.  Neurological: He is alert and oriented to person, place, and time. He exhibits normal muscle tone. Coordination normal.  Skin: Skin is warm and dry. No erythema. No pallor.  Psychiatric: Judgment and thought content normal.    Skin: HD catheter is clean and graft site is as well.       Assessment & Plan:    #1 LIkely Sterno-clavicular joint septic arthritis +/- sternal infection  ---he needs to be admitted to [facility] for urgent CT of the chest to evaluate the sternum and chest.  He also has some pain in the left side long rib cage and in axilla so good idea to image entire chest  I WOULD DISCUSS WITH RADIOLOGY AND NEPHROLOGY WHETHER OR NOT HE CAN OR SHOULD GET IV CONTRAST FOR THIS STUDY. I WOULD THINK THAT IMAGES WOULD BE BETTER QUALITY WITH CONTRAST AND I DOUBT HE HAS ANY HOPE OF RENAL RECOVERY AT THIS POINT SO I WOULD THINK CT WITH CONTRAST TONIGHT FOLLOWED BY HD TOMORROW WOULD BE REASONABLE  He will need formal CT surgery consultation after we get results of the CT scan  He should still have vancomycin in his system from dose yesterday  I am fine with stopping the rifampin  If he goes to surgery I would make sure that material from [facility] joint, bone is sent for culture  I would consider changing him from IV vancomycin to IV ancef (we did not go with this drug during last stay because of lack of CNS penetration).   He will need a further protracted course of IV antibiotics. 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   #3 Depression, PTSD  anxiety: he says he is waking up at night confused and anxious despite klonopin which he needs to calm down. ? Extra dose of shorter acting benzodiazepene  He denies SI or HI, though he had passive SI in the past post 2nd CT surgery and after came home from this admission  He would like formal psychiatric consult  #4 GI upset malaise: likely due to Rifampin which I think we can stop  #5 ESRD on HD: I dont see any hope for recovery see above discussion but would talk to Nephrology as well

## 2013-10-23 ENCOUNTER — Encounter (HOSPITAL_COMMUNITY): Payer: Self-pay

## 2013-10-23 ENCOUNTER — Inpatient Hospital Stay (HOSPITAL_COMMUNITY): Payer: Medicaid Other

## 2013-10-23 DIAGNOSIS — Z954 Presence of other heart-valve replacement: Secondary | ICD-10-CM

## 2013-10-23 DIAGNOSIS — R5381 Other malaise: Secondary | ICD-10-CM

## 2013-10-23 DIAGNOSIS — E890 Postprocedural hypothyroidism: Secondary | ICD-10-CM

## 2013-10-23 DIAGNOSIS — R5383 Other fatigue: Secondary | ICD-10-CM

## 2013-10-23 DIAGNOSIS — T827XXA Infection and inflammatory reaction due to other cardiac and vascular devices, implants and grafts, initial encounter: Principal | ICD-10-CM

## 2013-10-23 DIAGNOSIS — I719 Aortic aneurysm of unspecified site, without rupture: Secondary | ICD-10-CM

## 2013-10-23 LAB — CBC
HCT: 27.6 % — ABNORMAL LOW (ref 39.0–52.0)
Hemoglobin: 9 g/dL — ABNORMAL LOW (ref 13.0–17.0)
MCH: 29.7 pg (ref 26.0–34.0)
MCHC: 32.6 g/dL (ref 30.0–36.0)
MCV: 91.1 fL (ref 78.0–100.0)
Platelets: 221 10*3/uL (ref 150–400)
RBC: 3.03 MIL/uL — ABNORMAL LOW (ref 4.22–5.81)
RDW: 19.2 % — ABNORMAL HIGH (ref 11.5–15.5)
WBC: 3.3 10*3/uL — ABNORMAL LOW (ref 4.0–10.5)

## 2013-10-23 LAB — TROPONIN I
Troponin I: 0.67 ng/mL (ref <0.30)
Troponin I: 0.6 ng/mL (ref <0.30)
Troponin I: 0.71 ng/mL (ref <0.30)

## 2013-10-23 LAB — RENAL FUNCTION PANEL
Albumin: 1.8 g/dL — ABNORMAL LOW (ref 3.5–5.2)
BUN: 12 mg/dL (ref 6–23)
CO2: 28 mEq/L (ref 19–32)
Calcium: 8.4 mg/dL (ref 8.4–10.5)
Chloride: 97 mEq/L (ref 96–112)
Creatinine, Ser: 3.65 mg/dL — ABNORMAL HIGH (ref 0.50–1.35)
GFR calc Af Amer: 20 mL/min — ABNORMAL LOW (ref >90)
GFR calc non Af Amer: 17 mL/min — ABNORMAL LOW (ref >90)
Glucose, Bld: 114 mg/dL — ABNORMAL HIGH (ref 70–99)
Phosphorus: 3.9 mg/dL (ref 2.3–4.6)
Potassium: 3.7 mEq/L (ref 3.7–5.3)
Sodium: 136 mEq/L — ABNORMAL LOW (ref 137–147)

## 2013-10-23 LAB — C-REACTIVE PROTEIN: CRP: 6.9 mg/dL — ABNORMAL HIGH (ref <0.60)

## 2013-10-23 LAB — CLOSTRIDIUM DIFFICILE BY PCR: Toxigenic C. Difficile by PCR: NEGATIVE

## 2013-10-23 LAB — SEDIMENTATION RATE: Sed Rate: 55 mm/hr — ABNORMAL HIGH (ref 0–16)

## 2013-10-23 MED ORDER — RENA-VITE PO TABS
1.0000 | ORAL_TABLET | Freq: Every day | ORAL | Status: DC
  Administered 2013-10-23 – 2013-10-28 (×5): 1 via ORAL
  Filled 2013-10-23 (×6): qty 1

## 2013-10-23 MED ORDER — DARBEPOETIN ALFA-POLYSORBATE 100 MCG/0.5ML IJ SOLN
100.0000 ug | INTRAMUSCULAR | Status: DC
  Administered 2013-10-23: 100 ug via INTRAVENOUS
  Filled 2013-10-23: qty 0.5

## 2013-10-23 MED ORDER — ZOLPIDEM TARTRATE 5 MG PO TABS
5.0000 mg | ORAL_TABLET | Freq: Once | ORAL | Status: DC
  Filled 2013-10-23: qty 1

## 2013-10-23 MED ORDER — CLONAZEPAM 0.5 MG PO TABS
ORAL_TABLET | ORAL | Status: AC
  Administered 2013-10-23: 1 mg via ORAL
  Filled 2013-10-23: qty 2

## 2013-10-23 MED ORDER — IOHEXOL 300 MG/ML  SOLN
75.0000 mL | Freq: Once | INTRAMUSCULAR | Status: AC | PRN
  Administered 2013-10-23: 75 mL via INTRAVENOUS

## 2013-10-23 MED ORDER — CLONAZEPAM 1 MG PO TABS
1.0000 mg | ORAL_TABLET | Freq: Once | ORAL | Status: AC
  Administered 2013-10-23: 1 mg via ORAL
  Filled 2013-10-23: qty 1

## 2013-10-23 MED ORDER — DARBEPOETIN ALFA-POLYSORBATE 100 MCG/0.5ML IJ SOLN
INTRAMUSCULAR | Status: AC
  Administered 2013-10-23: 100 ug via INTRAVENOUS
  Filled 2013-10-23: qty 0.5

## 2013-10-23 MED ORDER — DARBEPOETIN ALFA-POLYSORBATE 100 MCG/0.5ML IJ SOLN: 100.0000 ug | INTRAMUSCULAR | Status: DC

## 2013-10-23 NOTE — Progress Notes (Signed)
PATIENT DETAILS Name: Bradley [name] Age: 58 y.o. Sex: male Date of Birth: 15-Mar-1956 Admit Date: 10/22/2013 Admitting Physician Kelvin Cellar, MD [id number] W, MD  Brief summary: 58 year old man with hx of prosthetic aortic valve replacement in 2008 and with aortic root correction in 2011 who was admitted on February 10th with severe MSSA bacteremia and endocarditis with septic emboli to brain. He was initally broadly treated then appropriately narrowed to nafcillin, gentamicin and rifampin. His blood cultures cleared on 2/11/5. He unfortunately developed renal failure and gentamicin was held. He was treated with IV nafcillin and IV rifampin 300 mg IV q 8 hours thru end of February when rifampin changed to 600mg  daily.He was ultimately discharged on 10/10/13 with vancomycin and oral rifampin because he lost IV access besided HD site. HE has AV fistula created in left forearm for HD. His last dose of vanocomcyin on 3/24.  He was seen at the infectious disease clinic on 3/25 weight complained of pain in his chest with deep breathing, and on exam he was found to have tenderness and erythema on the left sternoclavicular joint area. He was then referred to the hospitalist service for further evaluation and treatment. 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  Subjective: No major issues overnight.  Assessment/Plan: Suspected large paravalvular abscess/pseudoaneurysm in the region of the right coronary cusp - Recent history of MSSA bacteremia, continue Ancef-day 2. Was already on antibiotics prior to this admission - blood cultures done on 3/25 negative so far. - Cardiology pending TEE on 3/27, cardiothoracic surgery and infectious disease consulted.  Suspected prosthetic valve MSSA endocarditis  with septic emboli - Please see above for further details. Patient was discharged on 3/13 with IV vancomycin and oral rifampin, he was treated for presumed prosthetic valve endocarditis with septic emboli to the brain. - Unfortunately despite continued IV antibiotics, it looks like he has developed further complications with a CT of the chest done on 3/26 showing a possible large paravalvular abscess/pseudoaneurysm in the region of the right coronary cusp.  End-stage renal disease - Secondary to ATN from gentamicin or immune complex deposition from endocarditis. - Nephrology consulted for hemodialysis.  Positive troponins - Likely secondary to paravalvular abscess/pseudoaneurysm rather than non-STEMI. - Cardiology following.  Recent history of toxic metabolic encephalopathy - From review of his prior admission records, his mental status seems to have improved significantly. Continue to monitor.  Hypothyroidism - Continue with levothyroxine  Anemia - Likely secondary to chronic kidney disease and acute illness. Continue to monitor.  Secondary hyperparathyroidism - Will defer to nephrology  Disposition: Remain inpatient  DVT Prophylaxis: Prophylactic Heparin   Code Status: Full code   Family Communication None at bedside  Procedures:  None  CONSULTS:  cardiology, ID, nephrology and Cardiothoracic surgery  Time spent 40 minutes-which includes 50% of the time with face-to-face with patient/ family and coordinating care related to the above assessment and plan.    MEDICATIONS: Scheduled Meds: . [START ON 10/24/2013] aspirin EC  81 mg Oral QODAY  .  ceFAZolin (ANCEF) IV  2 g Intravenous Q M,W,F-HD  . darbepoetin (ARANESP) injection - DIALYSIS  100 mcg Intravenous Q Thu-HD  . feeding supplement (ENSURE COMPLETE)  237 mL Oral BID BM  . heparin  5,000 Units Subcutaneous 3  times per day  . levothyroxine  200 mcg Oral QAC breakfast  . multivitamin  1 tablet Oral QHS  .  sertraline  50 mg Oral Daily  . sevelamer carbonate  800-1,600 mg Oral TID WC  . zolpidem  5 mg Oral Once   Continuous Infusions:  PRN Meds:.acetaminophen, acetaminophen, calcium carbonate, clonazePAM, ondansetron (ZOFRAN) IV, ondansetron  Antibiotics: Anti-infectives   Start     Dose/Rate Route Frequency Ordered Stop   10/23/13 0800  ceFAZolin (ANCEF) IVPB 1 g/50 mL premix  Status:  Discontinued     1 g 100 mL/hr over 30 Minutes Intravenous  Once 10/22/13 2313 10/22/13 2326   10/22/13 2359  ceFAZolin (ANCEF) IVPB 2 g/50 mL premix     2 g 100 mL/hr over 30 Minutes Intravenous Every M-W-F (Hemodialysis) 10/22/13 2314     10/22/13 2315  ceFAZolin (ANCEF) IVPB 2 g/50 mL premix  Status:  Discontinued     2 g 100 mL/hr over 30 Minutes Intravenous  Once 10/22/13 2313 10/22/13 2326       PHYSICAL EXAM: Vital signs in last 24 hours: Filed Vitals:   10/23/13 1430 10/23/13 1500 10/23/13 1530 10/23/13 1600  BP: 112/79 111/67 119/75 104/72  Pulse: 84 81 91 83  Temp:      TempSrc:      Resp:      Height:      Weight:      SpO2:        Weight change:  Filed Weights   10/22/13 1826 10/23/13 1350  Weight: 84.414 kg (186 lb 1.6 oz) 85.2 kg (187 lb 13.3 oz)   Body mass index is 24.44 kg/(m^2).   Gen Exam: Awake and alert with clear speech.   Neck: Supple, No JVD.   Chest: B/L Clear.   CVS: S1 S2 Regular, systolic murmur Abdomen: soft, BS +, non tender, non distended.  Extremities: no edema, lower extremities warm to touch. Neurologic: Non Focal.   Skin: No Rash.   Wounds: N/A.   Intake/Output from previous day:  Intake/Output Summary (Last 24 hours) at 10/23/13 1629 Last data filed at 10/23/13 0031  Gross per 24 hour  Intake    702 ml  Output      0 ml  Net    702 ml     LAB RESULTS: CBC  Recent Labs Lab 10/22/13 2226 10/23/13 1411  WBC 13.1* 3.3*  HGB 10.0* 9.0*  HCT 30.1* 27.6*  PLT 258 221  MCV 91.5 91.1  MCH 30.4 29.7  MCHC 33.2 32.6  RDW 19.4* 19.2*      Chemistries   Recent Labs Lab 10/22/13 2226 10/23/13 1411  NA 135* 136*  K 3.7 3.7  CL 96 97  CO2 25 28  GLUCOSE 116* 114*  BUN 13 12  CREATININE 4.19* 3.65*  CALCIUM 8.7 8.4    CBG: No results found for this basename: GLUCAP,  in the last 168 hours  GFR Estimated Creatinine Clearance: 25.9 ml/min (by C-G formula based on Cr of 3.65).  Coagulation profile No results found for this basename: INR, PROTIME,  in the last 168 hours  Cardiac Enzymes  Recent Labs Lab 10/23/13 0254 10/23/13 0802 10/23/13 1411  TROPONINI 0.67* 0.60* 0.71*    No components found with this basename: POCBNP,  No results found for this basename: DDIMER,  in the last 72 hours No results found for this basename: HGBA1C,  in the last 72 hours No results found for this basename: CHOL, HDL, LDLCALC,  TRIG, CHOLHDL, LDLDIRECT,  in the last 72 hours No results found for this basename: TSH, T4TOTAL, FREET3, T3FREE, THYROIDAB,  in the last 72 hours No results found for this basename: VITAMINB12, FOLATE, FERRITIN, TIBC, IRON, RETICCTPCT,  in the last 72 hours No results found for this basename: LIPASE, AMYLASE,  in the last 72 hours  Urine Studies No results found for this basename: UACOL, UAPR, USPG, UPH, UTP, UGL, UKET, UBIL, UHGB, UNIT, UROB, ULEU, UEPI, UWBC, URBC, UBAC, CAST, CRYS, UCOM, BILUA,  in the last 72 hours  MICROBIOLOGY: Recent Results (from the past 240 hour(s))  CLOSTRIDIUM DIFFICILE BY PCR     Status: None   Collection Time    10/22/13 10:04 PM      Result Value Ref Range Status   C difficile by pcr NEGATIVE  NEGATIVE Final    RADIOLOGY STUDIES/RESULTS: Dg Chest 2 View  10/23/2013   CLINICAL DATA:  Chest pain  EXAM: CHEST  2 VIEW  COMPARISON:  None.  FINDINGS: Left dialysis catheter removed. New right internal jugular tunneled dialysis catheter. Tip in the right atrium. No pneumothorax. Left basilar atelectasis improved. Lung volumes improved.  IMPRESSION: New right internal  jugular dialysis catheter without pneumothorax. Tip is in the right atrium.  Improved left base atelectasis.   Electronically Signed   By: Maryclare Bean M.D.   On: 10/23/2013 08:05   Ct Chest W Contrast  10/23/2013   CLINICAL DATA:  MSSA bacteremia with endocarditis. New sternoclavicular tenderness and manubrial tenderness.  EXAM: CT CHEST WITH CONTRAST  TECHNIQUE: Multidetector CT imaging of the chest was performed during intravenous contrast administration.  CONTRAST:  83mL OMNIPAQUE IOHEXOL 300 MG/ML  SOLN  COMPARISON:  09/06/2013 abdominal CT  FINDINGS: THORACIC INLET/BODY WALL:  Right IJ dialysis catheter, tip at the upper right atrium. Thyroidectomy.  MEDIASTINUM:  Mild cardiomegaly. There is been previous aortic valve replacement and aortic root replacement. There is a new lobulated saccular outpouching arising near the right coronary cusp, which measures approximately 25 mm in diameter. The collection extends anteriorly and superiorly. The surrounding fat is newly infiltrated. There is no pericardial effusion. Coronary artery atherosclerosis. Subendocardial low-attenuation at the mildly thin left ventricular apex suggest previous subendocardial infarction.  Mild mediastinal lymphadenopathy which is likely reactive in the setting of endocarditis.  42 mm diameter aorta just distal to the aortic graft. Diameter stable from 2013 MRI.  LUNG WINDOWS:  No consolidation or edema. Mild dependent atelectasis. Trace left pleural effusion which appears simple.  UPPER ABDOMEN:  Coarse calcification in the posterior segment right lobe liver, likely from previous granulomatous disease. No evidence of embolic phenomenon to the imaged upper abdominal viscera.  OSSEOUS:  No evidence of septic arthritis or osteomyelitis at the site of sternal and manubrial tenderness. Remote left-sided rib fractures.  Critical Value/emergent results were called by telephone at the time of interpretation on 10/23/2013 at 10:18 AM to Dr. Sloan Leiter,  who verbally acknowledged these results.  IMPRESSION: 1. 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 Recommended gated cardiac CT or echocardiography. 2. No evidence of septic arthritis or osteomyelitis to explain sternoclavicular tenderness. 3. 42 mm diameter aorta just distal to the aortic graft. Diameter stable from 2013 MRI.   Electronically Signed   By: Jorje Guild M.D.   On: 10/23/2013 10:34   Ir Fluoro Guide Cv Line Right  09/25/2013   INDICATION: Acute renal failure. Temporary catheter needs conversion to a tunneled dialysis catheter.  EXAM: CONVERSION OF NON  TUNNELED CATHETER TO A TUNNELED DIALYSIS CATHETER WITH FLUOROSCOPIC GUIDANCE  Physician: Stephan Minister. Anselm Pancoast, MD  MEDICATIONS: 3 mg versed, 150 mcg fentanyl. The patient was already on scheduled antibiotics. A radiology nurse monitored the patient for moderate sedation.  ANESTHESIA/SEDATION: Moderate sedation time: 25 minutes  FLUOROSCOPY TIME:  24 seconds  PROCEDURE: Informed consent was obtained for conversion to a tunneled dialysis catheter. The existing right jugular catheter and right side of the neck was prepped and draped in sterile fashion. Maximal barrier sterile technique was utilized including caps, mask, sterile gowns, sterile gloves, sterile drape, hand hygiene and skin antiseptic. The skin below the right clavicle was anesthetized with 1% lidocaine. A subcutaneous tract was created and a 23 cm tip to cuff HemoSplit catheter was placed through the tract to the temporary dialysis catheter site. The temporary dialysis catheter was exchanged for a peel-away sheath over a wire. The new dialysis catheter was placed through the peel-away sheath and the tip was placed in the right atrium. Both lumens aspirated and flushed well. Appropriate amount of heparin was placed in both lumens. Gelfoam was placed within the subcutaneous tract. The vein skin site and catheter exit site were sutured with 2-0 Ethilon  sutures.  COMPLICATIONS: None  FINDINGS: Catheter tip in the right atrium.  IMPRESSION: Successful conversion of a temporary dialysis catheter to a tunneled dialysis catheter with fluoroscopy.   Electronically Signed   By: Markus Daft M.D.   On: 09/25/2013 16:25    Oren Binet, MD  [facility] Pager:[phone]  If 7PM-7AM, please contact night-coverage [email or web address] Password [email or web address] 10/23/2013, 4:29 PM   LOS: 1 day

## 2013-10-23 NOTE — Consult Note (Signed)
[name]       [place] [address]             [phone]        Bradley [name] [facility] Medical Record [id number] Date of Birth: Mar 31, 1956  Referring: Dr Wendie Agreste Primary Care: [name], MD  Chief Complaint:   History of Endocarditis    History of Present Illness: time the patient is a 58 year old male with a long cardiac history dating back to childhood when at approximately age 72 he had a aortic valvulotomy. Later in adulthood he developed a standing aortic aneurysm and subsequently underwent Bentall procedure with tissue aortic valve prosthesis. This was in 2011 by [name]. Most recently the patient sepsis with MSSa bacteremia. This lead to septic brain emboli and presumptive aortic valve endocarditis managed by infectious disease. The patient developed renal failure felt to be associated with gentamicin. The patient now has developed some pain in the left sided sternoclavicular region and is being worked up for possible osteomyelitis and sternal wound infection. A CT scan has been scheduled which is currently pending. We are currently asked to assist in management. He does have an AV fistula as well as a dialysis catheter.he does have chills without fever. He has chronic abdominal cramps and loose stools. Previous C. Difficile tests are negative.    Current Activity/ Functional Status: Patient is not independent with mobility/ambulation, transfers, ADL's, IADL's.uses a walker, does not ambulate very far due to generalized weakness and constant fatigue   Zubrod Score: At the time of surgery this patient's most appropriate activity status/level should be described as: []     0    Normal activity, no symptoms []     1    Restricted in physical strenuous activity but ambulatory, able to do out light work [x]     2    Ambulatory and capable of self care, unable to do work activities, up and about                 more than 50%  Of the time                             []     3    Only limited self care, in bed greater than 50% of waking hours []     4    Completely disabled, no self care, confined to bed or chair []     5    Moribund  Past Medical History  Diagnosis Date  . Anxiety   . Depression   . Hypertension   . Hypothyroidism, postsurgical   . Aortic stenosis     s/p Bentall with bioprosthetic AVR 02/2010; Last echo (9/11): Moderate LVH, EF 45-50%, AVR functioning appropriately, aortic valve mean gradient 21, diastolic dysfunction. Chest MRA (2/13): Mild to moderate dilatation at the sinus of Valsalva at 4.1 cm, mild dilatation ascending aorta distal to the tube graft at 3.9 cm, moderate dilatation of the innominate artery a 2.1 cm;    . Hx of cardiac cath     a. LHC in 02/2010: normal cors  . Hx of echocardiogram 2014    Echo (10/14): Severe LVH, EF 60-65%, normal wall motion, grade 1 diastolic dysfunction, AVR functioning normally, mild aortic stenosis (mean 19), moderately dilated aorta, mild LAE, mild RVE  . Staphylococcus aureus bacteremia   . Prosthetic valve endocarditis   . Thyroid cancer   .  Heart murmur   . ESRD on dialysis     "[facility]; TTS" (10/22/2013)    Past Surgical History  Procedure Laterality Date  . Cardiac catheterization  03/02/2010    NORMAL CORONARY ARTERY  . Sternotomy      REDO  . Transthoracic echocardiogram  03/2010    SHOWED MILD REDUCTION OF LV FUNCTION  . Thyroidectomy  ~ 2005  . Shoulder arthroscopy w/ rotator cuff repair Right 2012  . Av fistula placement Left 10/08/2013    Procedure: ARTERIOVENOUS (AV) FISTULA CREATION- LEFT ARM; Radial Cephalic ;  Surgeon: Mal [name], MD;  Location: [facility];  Service: Vascular;  Laterality: Left;  . Aortic valve replacement  2011  . Cardiac valve replacement    . Aortic valve repair  1968    History  Smoking status  . Never Smoker   Smokeless tobacco  . Never Used    History  Alcohol Use No    History   Social History  . Marital  Status: Married    Spouse Name: N/A    Number of Children: N/A  . Years of Education: N/A   Occupational History  . Not on file.   Social History Main Topics  . Smoking status: Never Smoker   . Smokeless tobacco: Never Used  . Alcohol Use: No  . Drug Use: No  . Sexual Activity: No   Other Topics Concern  . Not on file   Social History Narrative  . No narrative on file    Allergies  Allergen Reactions  . Oxycodone     Gives patient nightmares  . Rifampin Nausea Only    Current Facility-Administered Medications  Medication Dose Route Frequency Provider Last Rate Last Dose  . acetaminophen (TYLENOL) tablet 650 mg  650 mg Oral Q6H PRN Orson Eva, MD       Or  . acetaminophen (TYLENOL) suppository 650 mg  650 mg Rectal Q6H PRN Orson Eva, MD      . Derrill Memo ON 10/24/2013] aspirin EC tablet 81 mg  81 mg Oral Roberto Scales, MD      . calcium carbonate (TUMS - dosed in mg elemental calcium) chewable tablet 400 mg of elemental calcium  400 mg of elemental calcium Oral TID PRN Rhetta Mura Schorr, NP   400 mg of elemental calcium at 10/22/13 2259  . ceFAZolin (ANCEF) IVPB 2 g/50 mL premix  2 g Intravenous Q M,W,F-HD Orson Eva, MD   2 g at 10/23/13 0031  . clonazePAM (KLONOPIN) tablet 1 mg  1 mg Oral BID PRN Orson Eva, MD   1 mg at 10/22/13 2348  . feeding supplement (ENSURE COMPLETE) (ENSURE COMPLETE) liquid 237 mL  237 mL Oral BID BM Orson Eva, MD      . heparin injection 5,000 Units  5,000 Units Subcutaneous 3 times per day Orson Eva, MD      . levothyroxine (SYNTHROID, LEVOTHROID) tablet 200 mcg  200 mcg Oral QAC breakfast Orson Eva, MD      . ondansetron [facility]) tablet 4 mg  4 mg Oral Q6H PRN Orson Eva, MD       Or  . ondansetron (ZOFRAN) injection 4 mg  4 mg Intravenous Q6H PRN Orson Eva, MD      . sertraline (ZOLOFT) tablet 50 mg  50 mg Oral Daily David Tat, MD      . sevelamer carbonate (RENVELA) tablet 800-1,600 mg  800-1,600 mg Oral TID WC Orson Eva, MD   800 mg  at 10/22/13  2117  . zolpidem (AMBIEN) tablet 5 mg  5 mg Oral Once Jeryl Columbia, NP        Prescriptions prior to admission  Medication Sig Dispense Refill  . acetaminophen (TYLENOL) 500 MG tablet Take 500 mg by mouth every 6 (six) hours as needed for mild pain or fever.      Marland Kitchen aspirin EC 81 MG tablet Take 81 mg by mouth every other day.      . clonazePAM (KLONOPIN) 1 MG tablet Take 1 mg by mouth 2 (two) times daily as needed for anxiety.      . ENSURE PLUS (ENSURE PLUS) LIQD Take 237 mLs by mouth 2 (two) times daily between meals.      Marland Kitchen levothyroxine (SYNTHROID, LEVOTHROID) 200 MCG tablet Take 200 mcg by mouth daily before breakfast.      . rifampin (RIFADIN) 300 MG capsule Take 600 mg by mouth daily.      . sertraline (ZOLOFT) 50 MG tablet Take 50 mg by mouth daily.      . sevelamer carbonate (RENVELA) 800 MG tablet Take 800-1,600 mg by mouth 3 (three) times daily with meals. Takes 1600mg  with larger amounts of food and 800mg  with smaller amounts of food        Family History  Problem Relation Age of Onset  . Heart disease Father      Review of Systems:     Cardiac Review of Systems: Y or N  Chest Pain [  Y  ]  Resting SOB [   ] Exertional SOB  [  ]  Orthopnea [  ]   Pedal Edema [   ]    Palpitations [  ] Syncope  [  ]   Presyncope [   ]  General Review of Systems: [Y] = yes [  ]=no Constitional: recent weight change [  ]; anorexia [x  ]; fatigue Valu.Nieves  ]; nausea [  ]; night sweats [  ]; fever [  ]; or chills [ Y]                                                               Dental: poor dentition[  ]; Last Dentist visit:   Eye : blurred vision [  ]; diplopia [   ]; vision changes [  ];  Amaurosis fugax[  ]; Resp: cough [  ];  wheezing[  ];  hemoptysis[  ]; shortness of breath[  ]; paroxysmal nocturnal dyspnea[  ]; dyspnea on exertion[  ]; or orthopnea[  ];  GI:  gallstones[  ], vomiting[  ];  dysphagia[  ]; melena[  ];  hematochezia [  ]; heartburn[Y  ];   Hx of  Colonoscopy[  ]; GU:  kidney stones [  ]; hematuria[  ];   dysuria [  ];  nocturia[  ];  history of     obstruction [  ]; urinary frequency [  ]             Skin: rash, swelling[  ];, hair loss[  ];  peripheral edema[  ];  or itching[  ]; Musculosketetal: myalgias[ y];  joint swelling[  ];  joint erythema[  ];  joint pain[  ];  back pain[  ];ribs pain on  right lateral  Heme/Lymph: bruising[  ];  bleeding[  ];  anemia[  ];  Neuro: TIA[  ];  headaches[  ];  stroke[  ];  vertigo[  ];  seizures[  ];   Paresthesias[x bilat feet  ];  difficulty walking[  ];  Psych:depression[  ]; anxiety[x  ];  Endocrine: diabetes[  ];  thyroid dysfunction[  ];  Immunizations: Flu [  ]; Pneumococcal[  ];  Other: ESRD on dialysis  Physical Exam: BP 112/61  Pulse 86  Temp(Src) 99.3 F (37.4 C) (Oral)  Resp 20  Ht 6' 1.5" (1.867 m)  Wt 186 lb 1.6 oz (84.414 kg)  BMI 24.22 kg/m2  SpO2 95%  General appearance: alert, cooperative, fatigued and no distress Neurologic: intact and some feet numbness grossly Heart: regular rate and rhythm and 3/6 systolic aortic mutmur, no rub or gallop Lungs: mildly dim in the bases Abdomen: soft, non-tender; bowel sounds normal; no masses,  no organomegaly Extremities: extremities normal, atraumatic, no cyanosis or edema Wound: sternal incision well healed without erethema. No sterno-clavicular tenderness or erethema gu/rectal- deferred Skin- no rashes or lesions Musculoskeletal-mild TTP right anterolateral ribs  Diagnostic Studies & Laboratory data:     Recent Radiology Findings:   Dg Chest 2 View  10/23/2013   CLINICAL DATA:  Chest pain  EXAM: CHEST  2 VIEW  COMPARISON:  None.  FINDINGS: Left dialysis catheter removed. New right internal jugular tunneled dialysis catheter. Tip in the right atrium. No pneumothorax. Left basilar atelectasis improved. Lung volumes improved.  IMPRESSION: New right internal jugular dialysis catheter without pneumothorax. Tip is in the right atrium.  Improved left base  atelectasis.   Electronically Signed   By: Maryclare Bean M.D.   On: 10/23/2013 08:05   Dg Chest 2 View  10/23/2013   CLINICAL DATA:  Chest pain  EXAM: CHEST  2 VIEW  COMPARISON:  None.  FINDINGS: Left dialysis catheter removed. New right internal jugular tunneled dialysis catheter. Tip in the right atrium. No pneumothorax. Left basilar atelectasis improved. Lung volumes improved.  IMPRESSION: New right internal jugular dialysis catheter without pneumothorax. Tip is in the right atrium.  Improved left base atelectasis.   Electronically Signed   By: Maryclare Bean M.D.   On: 10/23/2013 08:05   Ct Chest W Contrast  10/23/2013   CLINICAL DATA:  MSSA bacteremia with endocarditis. New sternoclavicular tenderness and manubrial tenderness.  EXAM: CT CHEST WITH CONTRAST  TECHNIQUE: Multidetector CT imaging of the chest was performed during intravenous contrast administration.  CONTRAST:  8mL OMNIPAQUE IOHEXOL 300 MG/ML  SOLN  COMPARISON:  09/06/2013 abdominal CT  FINDINGS: THORACIC INLET/BODY WALL:  Right IJ dialysis catheter, tip at the upper right atrium. Thyroidectomy.  MEDIASTINUM:  Mild cardiomegaly. There is been previous aortic valve replacement and aortic root replacement. There is a new lobulated saccular outpouching arising near the right coronary cusp, which measures approximately 25 mm in diameter. The collection extends anteriorly and superiorly. The surrounding fat is newly infiltrated. There is no pericardial effusion. Coronary artery atherosclerosis. Subendocardial low-attenuation at the mildly thin left ventricular apex suggest previous subendocardial infarction.  Mild mediastinal lymphadenopathy which is likely reactive in the setting of endocarditis.  42 mm diameter aorta just distal to the aortic graft. Diameter stable from 2013 MRI.  LUNG WINDOWS:  No consolidation or edema. Mild dependent atelectasis. Trace left pleural effusion which appears simple.  UPPER ABDOMEN:  Coarse calcification in the posterior  segment right lobe liver, likely from previous granulomatous disease.  No evidence of embolic phenomenon to the imaged upper abdominal viscera.  OSSEOUS:  No evidence of septic arthritis or osteomyelitis at the site of sternal and manubrial tenderness. Remote left-sided rib fractures.  Critical Value/emergent results were called by telephone at the time of interpretation on 10/23/2013 at 10:18 AM to Dr. Sloan Leiter, who verbally acknowledged these results.  IMPRESSION: 1. 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 Recommended gated cardiac CT or echocardiography. 2. No evidence of septic arthritis or osteomyelitis to explain sternoclavicular tenderness. 3. 42 mm diameter aorta just distal to the aortic graft. Diameter stable from 2013 MRI.   Electronically Signed   By: Jorje Guild M.D.   On: 10/23/2013 10:34    Recent Lab Findings: Lab Results  Component Value Date   WBC 13.1* 10/22/2013   HGB 10.0* 10/22/2013   HCT 30.1* 10/22/2013   PLT 258 10/22/2013   GLUCOSE 116* 10/22/2013   CHOL 145 09/25/2013   TRIG 156* 09/25/2013   HDL 27* 09/25/2013   LDLCALC 87 09/25/2013   ALT 16 09/25/2013   AST 22 09/25/2013   NA 135* 10/22/2013   K 3.7 10/22/2013   CL 96 10/22/2013   CREATININE 4.19* 10/22/2013   BUN 13 10/22/2013   CO2 25 10/22/2013   TSH 10.174* 09/14/2013   INR 1.31 10/08/2013   HGBA1C 5.7* 09/09/2013      Assessment / Plan:  1/ Patient admitted for  possible osteomyelitis left sternoclavicular joint or sternal wound infection. Exam is not impressive for this. 2/ S/P redo AVR with Bental/tissue valve 4 years ago /  Now poss proximal aortic/ right coronary button  false aneurysm   on ct of the chest but non contrast scan not adequate study to evaluate this agree with TEE. Will need dedicated  Cardiac ct including ascending aorta  3/ Chronic renal failure on dialysis via rt  IJ cathter      [name] MD      [address] [place] [address] Office [phone]   Beeper [phone]

## 2013-10-23 NOTE — Progress Notes (Signed)
Pt w/ elevated troponin 0.67. RN had not yet been notified by lab of critical value. V/s stable. Pt w/o complaints, pt alert and oriented. Notified provider on call.

## 2013-10-23 NOTE — Consult Note (Signed)
[facility]     Reason for Consult:  perivavlular abscess vs pseudoaneurysm   Referring Physician: Dr. Sloan Leiter  Active Problems:   Septic arthritis   Chronic combined systolic and diastolic CHF (congestive heart failure)   . [START ON 10/24/2013] aspirin EC  81 mg Oral QODAY  .  ceFAZolin (ANCEF) IV  2 g Intravenous Q M,W,F-HD  . darbepoetin (ARANESP) injection - DIALYSIS  100 mcg Intravenous Q Thu-HD  . feeding supplement (ENSURE COMPLETE)  237 mL Oral BID BM  . heparin  5,000 Units Subcutaneous 3 times per day  . levothyroxine  200 mcg Oral QAC breakfast  . multivitamin  1 tablet Oral QHS  . sertraline  50 mg Oral Daily  . sevelamer carbonate  800-1,600 mg Oral TID WC  . zolpidem  5 mg Oral Once    Recommendations: Continue with cefazolin   Assessment: He has had vague symptoms of malaise, chest pain and CT reveals abscess vs pseudoaneurysm.    Appreciate [facility] admitting this patient.   Antibiotics: cefazolin  HPI: Bradley [name] is a 58 y.o. male with a history of pediatric aortic valvulotomy with tissue AVR done in 2001, thyroid cancer who recently was here for probable endocarditis with septic emboli, though no TEE was able to be done.  Blood cultures did grow MSSA bacteremia.  Was on a prolonged course and was getting vancomycin (for CNS penetration and ease of use with dialysis).  He was seen for routine visit in [facility] and concern for sternal infection and sent in to [facility] for admission.  CT did not show any sternal issues but abscess vs pseudoaneurysm of right coronary cusp.  No fever, no chills.  Overally malaise and not feeling himself.  Cardiology to do TEE.     Review of Systems: A comprehensive review of systems was negative.  Past Medical History  Diagnosis Date  . Anxiety   . Depression   . Hypertension   . Hypothyroidism, postsurgical   . Aortic stenosis     s/p Bentall with bioprosthetic AVR 02/2010; Last echo (9/11): Moderate  LVH, EF 45-50%, AVR functioning appropriately, aortic valve mean gradient 21, diastolic dysfunction. Chest MRA (2/13): Mild to moderate dilatation at the sinus of Valsalva at 4.1 cm, mild dilatation ascending aorta distal to the tube graft at 3.9 cm, moderate dilatation of the innominate artery a 2.1 cm;    . Hx of cardiac cath     a. LHC in 02/2010: normal cors  . Hx of echocardiogram 2014    Echo (10/14): Severe LVH, EF 60-65%, normal wall motion, grade 1 diastolic dysfunction, AVR functioning normally, mild aortic stenosis (mean 19), moderately dilated aorta, mild LAE, mild RVE  . Staphylococcus aureus bacteremia   . Prosthetic valve endocarditis   . Thyroid cancer   . Heart murmur   . ESRD on dialysis     "[facility]; TTS" (10/22/2013)    History  Substance Use Topics  . Smoking status: Never Smoker   . Smokeless tobacco: Never Used  . Alcohol Use: No    Family History  Problem Relation Age of Onset  . Heart disease Father    Allergies  Allergen Reactions  . Oxycodone     Gives patient nightmares  . Rifampin Nausea Only    OBJECTIVE: Blood pressure 112/61, pulse 86, temperature 99.3 F (37.4 C), temperature source Oral, resp. rate 20, height 6' 1.5" (1.867 m), weight 186 lb 1.6 oz (84.414 kg), SpO2  95.00%. General: awake, alert nad Skin: no rashes Lungs: CTA B Cor: RRR with 2/6 SEM llsb with click Abdomen: soft, nt, nd Ext: no edema Line: non tender, right upper chest  Microbiology: Recent Results (from the past 240 hour(s))  CLOSTRIDIUM DIFFICILE BY PCR     Status: None   Collection Time    10/22/13 10:04 PM      Result Value Ref Range Status   C difficile by pcr NEGATIVE  NEGATIVE Final    Scharlene Gloss, [facility] [email or web address] [phone] pager  [phone] cell 10/23/2013, 1:12 PM

## 2013-10-23 NOTE — Progress Notes (Signed)
Pt c/o restlessness and unable to sleep. Pt OOB to chair w/o any assistance. Pt informed RN that he usually takes 2 klonopin at home to help him rest during the night. RN explained to pt how it was ordered in his MAR to be administered. Pt insisted that he would be unable to sleep without another klonopin. Pt calling nurses station to speak with RN and constantly out of bed.Pt is pleasant but anxious. Informed provider on call. Per hospital policy, too late to give sleep aid so NP ordered a one time dose of klonopin 1mg  for pt to receive. Will continue to monitor pt.

## 2013-10-23 NOTE — Consult Note (Addendum)
CARDIOLOGY CONSULT NOTE   Patient ID: Bradley [name] MRN: [id number], DOB/AGE: 1956/03/24   Admit date: 10/22/2013 Date of Consult: 10/23/2013   Primary Physician: [name], MD Primary Cardiologist: [name]  Pt. Profile  58 year old gentleman admitted after being seen in the infectious disease clinic yesterday for concern about sternoclavicular tenderness in the setting of recent endocarditis.  Problem List  Past Medical History  Diagnosis Date  . Anxiety   . Depression   . Hypertension   . Hypothyroidism, postsurgical   . Aortic stenosis     s/p Bentall with bioprosthetic AVR 02/2010; Last echo (9/11): Moderate LVH, EF 45-50%, AVR functioning appropriately, aortic valve mean gradient 21, diastolic dysfunction. Chest MRA (2/13): Mild to moderate dilatation at the sinus of Valsalva at 4.1 cm, mild dilatation ascending aorta distal to the tube graft at 3.9 cm, moderate dilatation of the innominate artery a 2.1 cm;    . Hx of cardiac cath     a. LHC in 02/2010: normal cors  . Hx of echocardiogram 2014    Echo (10/14): Severe LVH, EF 60-65%, normal wall motion, grade 1 diastolic dysfunction, AVR functioning normally, mild aortic stenosis (mean 19), moderately dilated aorta, mild LAE, mild RVE  . Staphylococcus aureus bacteremia   . Prosthetic valve endocarditis   . Thyroid cancer   . Heart murmur   . ESRD on dialysis     "[facility]; TTS" (10/22/2013)    Past Surgical History  Procedure Laterality Date  . Cardiac catheterization  03/02/2010    NORMAL CORONARY ARTERY  . Sternotomy      REDO  . Transthoracic echocardiogram  03/2010    SHOWED MILD REDUCTION OF LV FUNCTION  . Thyroidectomy  ~ 2005  . Shoulder arthroscopy w/ rotator cuff repair Right 2012  . Av fistula placement Left 10/08/2013    Procedure: ARTERIOVENOUS (AV) FISTULA CREATION- LEFT ARM; Radial Cephalic ;  Surgeon: Mal [name], MD;  Location: [facility];  Service: Vascular;  Laterality: Left;  . Aortic  valve replacement  2011  . Cardiac valve replacement    . Aortic valve repair  1968     Allergies  Allergies  Allergen Reactions  . Oxycodone     Gives patient nightmares  . Rifampin Nausea Only    HPI   This 58 year old gentleman is admitted yesterday because of complaint of sternal pain.  He has a past history of congenital aortic stenosis and had an aortic valvulotomy at age 3.  He had any prosthetic aortic valve replacement in 2008.  In 2011 he underwent Bentall procedure with bioprosthetic aortic tissue valve by [name].  The patient was admitted on 09/09/13 with severe MSSA bacteremia and endocarditis with septic emboli to brain.  He was treated initially with broad-spectrum and then appropriately narrowed to nafcillin gentamicin and rifampin.  His blood cultures cleared on 09/10/13 unfortunately he developed renal failure and gentamicin was held.  He is now on hemodialysis.  He has an AV fistula created in the left forearm for hemodialysis.  He was seen yesterday in the infectious disease clinic by [name] and on" he complained of pain in his chest with deep breathing and was noted to have tenderness in the left sternoclavicular area.  The patient denies any recent fevers.  He has not been having any recent night sweats. Inpatient Medications  . [START ON 10/24/2013] aspirin EC  81 mg Oral QODAY  .  ceFAZolin (ANCEF) IV  2 g Intravenous Q M,W,F-HD  .  feeding supplement (ENSURE COMPLETE)  237 mL Oral BID BM  . heparin  5,000 Units Subcutaneous 3 times per day  . levothyroxine  200 mcg Oral QAC breakfast  . sertraline  50 mg Oral Daily  . sevelamer carbonate  800-1,600 mg Oral TID WC  . zolpidem  5 mg Oral Once    Family History Family History  Problem Relation Age of Onset  . Heart disease Father      Social History History   Social History  . Marital Status: Married    Spouse Name: N/A    Number of Children: N/A  . Years of Education: N/A   Occupational  History  . Not on file.   Social History Main Topics  . Smoking status: Never Smoker   . Smokeless tobacco: Never Used  . Alcohol Use: No  . Drug Use: No  . Sexual Activity: No   Other Topics Concern  . Not on file   Social History Narrative  . No narrative on file     Review of Systems  General:  No chills, fever, night sweats or weight changes.  Cardiovascular:  No chest pain, positive for dyspnea on exertion, edema, orthopnea, palpitations, paroxysmal nocturnal dyspnea. Dermatological: No rash, lesions/masses Respiratory: No cough, dyspnea Urologic: No hematuria, dysuria Abdominal:   Positive for abdominal cramps and loose stools Neurologic:  No visual changes, wkns, changes in mental status. All other systems reviewed and are otherwise negative except as noted above.  Physical Exam  Blood pressure 112/61, pulse 86, temperature 99.3 F (37.4 C), temperature source Oral, resp. rate 20, height 6' 1.5" (1.867 m), weight 186 lb 1.6 oz (84.414 kg), SpO2 95.00%.  General: Pleasant, NAD.  Anxious and tearful as he describes his shortness of breath with minimal activity. Psych: Normal affect. Neuro: Alert and oriented X 3. Moves all extremities spontaneously. HEENT: Normal  Neck: Supple without bruits or JVD. Lungs:  Resp regular and unlabored, CTA. Heart: There is a grade 2/6 harsh systolic ejection murmur across the aortic valve area.  I do not hear any aortic insufficiency murmur.  The second heart sound is crisp. Abdomen: Soft, non-tender, non-distended, BS + x 4.  Extremities: No clubbing, cyanosis or edema. DP/PT/Radials 2+ and equal bilaterally.  Labs   Recent Labs  10/23/13 0254 10/23/13 0802  TROPONINI 0.67* 0.60*   Lab Results  Component Value Date   WBC 13.1* 10/22/2013   HGB 10.0* 10/22/2013   HCT 30.1* 10/22/2013   MCV 91.5 10/22/2013   PLT 258 10/22/2013    Recent Labs Lab 10/22/13 2226  NA 135*  K 3.7  CL 96  CO2 25  BUN 13  CREATININE 4.19*    CALCIUM 8.7  GLUCOSE 116*   Lab Results  Component Value Date   CHOL 145 09/25/2013   HDL 27* 09/25/2013   LDLCALC 87 09/25/2013   TRIG 156* 09/25/2013   Lab Results  Component Value Date   DDIMER 11.85* 09/10/2013    Radiology/Studies  Dg Chest 2 View  10/23/2013   CLINICAL DATA:  Chest pain  EXAM: CHEST  2 VIEW  COMPARISON:  None.  FINDINGS: Left dialysis catheter removed. New right internal jugular tunneled dialysis catheter. Tip in the right atrium. No pneumothorax. Left basilar atelectasis improved. Lung volumes improved.  IMPRESSION: New right internal jugular dialysis catheter without pneumothorax. Tip is in the right atrium.  Improved left base atelectasis.   Electronically Signed   By: Maryclare Bean M.D.   On: 10/23/2013  08:05   Ct Chest W Contrast  10/23/2013   CLINICAL DATA:  MSSA bacteremia with endocarditis. New sternoclavicular tenderness and manubrial tenderness.  EXAM: CT CHEST WITH CONTRAST  TECHNIQUE: Multidetector CT imaging of the chest was performed during intravenous contrast administration.  CONTRAST:  43mL OMNIPAQUE IOHEXOL 300 MG/ML  SOLN  COMPARISON:  09/06/2013 abdominal CT  FINDINGS: THORACIC INLET/BODY WALL:  Right IJ dialysis catheter, tip at the upper right atrium. Thyroidectomy.  MEDIASTINUM:  Mild cardiomegaly. There is been previous aortic valve replacement and aortic root replacement. There is a new lobulated saccular outpouching arising near the right coronary cusp, which measures approximately 25 mm in diameter. The collection extends anteriorly and superiorly. The surrounding fat is newly infiltrated. There is no pericardial effusion. Coronary artery atherosclerosis. Subendocardial low-attenuation at the mildly thin left ventricular apex suggest previous subendocardial infarction.  Mild mediastinal lymphadenopathy which is likely reactive in the setting of endocarditis.  42 mm diameter aorta just distal to the aortic graft. Diameter stable from 2013 MRI.  LUNG  WINDOWS:  No consolidation or edema. Mild dependent atelectasis. Trace left pleural effusion which appears simple.  UPPER ABDOMEN:  Coarse calcification in the posterior segment right lobe liver, likely from previous granulomatous disease. No evidence of embolic phenomenon to the imaged upper abdominal viscera.  OSSEOUS:  No evidence of septic arthritis or osteomyelitis at the site of sternal and manubrial tenderness. Remote left-sided rib fractures.  Critical Value/emergent results were called by telephone at the time of interpretation on 10/23/2013 at 10:18 AM to Dr. Sloan Leiter, who verbally acknowledged these results.  IMPRESSION: 1. 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 Recommended gated cardiac CT or echocardiography. 2. No evidence of septic arthritis or osteomyelitis to explain sternoclavicular tenderness. 3. 42 mm diameter aorta just distal to the aortic graft. Diameter stable from 2013 MRI.   Electronically Signed   By: Jorje Guild M.D.   On: 10/23/2013 10:34   Ir Fluoro Guide Cv Line Right  09/25/2013   INDICATION: Acute renal failure. Temporary catheter needs conversion to a tunneled dialysis catheter.  EXAM: CONVERSION OF NON TUNNELED CATHETER TO A TUNNELED DIALYSIS CATHETER WITH FLUOROSCOPIC GUIDANCE  Physician: Stephan Minister. Anselm Pancoast, MD  MEDICATIONS: 3 mg versed, 150 mcg fentanyl. The patient was already on scheduled antibiotics. A radiology nurse monitored the patient for moderate sedation.  ANESTHESIA/SEDATION: Moderate sedation time: 25 minutes  FLUOROSCOPY TIME:  24 seconds  PROCEDURE: Informed consent was obtained for conversion to a tunneled dialysis catheter. The existing right jugular catheter and right side of the neck was prepped and draped in sterile fashion. Maximal barrier sterile technique was utilized including caps, mask, sterile gowns, sterile gloves, sterile drape, hand hygiene and skin antiseptic. The skin below the right clavicle was  anesthetized with 1% lidocaine. A subcutaneous tract was created and a 23 cm tip to cuff HemoSplit catheter was placed through the tract to the temporary dialysis catheter site. The temporary dialysis catheter was exchanged for a peel-away sheath over a wire. The new dialysis catheter was placed through the peel-away sheath and the tip was placed in the right atrium. Both lumens aspirated and flushed well. Appropriate amount of heparin was placed in both lumens. Gelfoam was placed within the subcutaneous tract. The vein skin site and catheter exit site were sutured with 2-0 Ethilon sutures.  COMPLICATIONS: None  FINDINGS: Catheter tip in the right atrium.  IMPRESSION: Successful conversion of a temporary dialysis catheter to a tunneled dialysis catheter with  fluoroscopy.   Electronically Signed   By: Markus Daft M.D.   On: 09/25/2013 16:25    ECG Sinus rhythm with occasional Premature ventricular complexes Incomplete left bundle branch block Left ventricular hypertrophy Nonspecific ST abnormality ASSESSMENT AND PLAN  1. past history of an MSSA bacteremia and sepsis treated with 6 weeks of antibiotics. 2. history of Bentall procedure 2011 by [name] 3. end-stage renal disease on dialysis 4. sternoclavicular tenderness 5. CT of the chest today showing large paravalvular abscess/pseudoaneurysm in the region of the right coronary cusp.  Plan: We will obtain a TEE tomorrow morning to evaluate prosthetic aortic valve and aortic root further.  Depending on findings we may also wish to do a dedicated gated cardiac CT.   Signed, Darlin Coco, MD  10/23/2013, 11:22 AM

## 2013-10-23 NOTE — Progress Notes (Signed)
Notified Schorr, NP that pt received klonopin at 1430. Order is q12hprn. Schorr, Np stated okay to give klonopin now. Will continue to monitor pt. Ranelle Oyster, RN

## 2013-10-23 NOTE — Procedures (Signed)
I was present at this dialysis session, have reviewed the session itself and made  appropriate changes  Kelly Splinter MD (pgr) [phone]    [phone] 10/23/2013, 3:55 PM

## 2013-10-23 NOTE — Progress Notes (Signed)
Spoke with NP on call about pt. Informed NP that pt is w/o complaints, no pain. No new orders given. Will continue to monitor pt.

## 2013-10-23 NOTE — Consult Note (Signed)
[facility] Renal Consultation Note    Indication for Consultation:  Management of ESRD/hemodialysis; anemia, hypertension/volume and secondary hyperparathyroidism  HPI: Bradley [name] is a 58 y.o. male with ESRD that progressed from AKI due to gent vs immune complex disease from sepsis last admission who dialysis TTS at [facility]. During his last admission he was treated from MSSA bacteremia with AV endocarditis and septic emboli causing embolic strokes that was subsequently treated with nafcillin and rifampin and then discharged on Vanc and rifampin with the last dose given 3/24.  Rifampin has been causing nausea and indigestion. When seen by ID yesterday he reported pain in chest with deep inspiration, swollen and tender [place] joint/sternum.  He was admitted to [facility] for further evaulation and treatment.  Per Dr. Derek Mound note from yesterday, he also had worsening anxiety/depression and PTSD like sx from recent severe illness.  Today he tells me he has had DOE, but no orthopnea or PND. Abdominal pain like a lump in his side with burping at times. Pain over sternum and [place] joint better. No more N, , D, fever or chills. He feels anxious, restless like he doesn't want to be here. He has been anorexic. Denies any problems with dialysis treatments.  Past Medical History  Diagnosis Date  . Anxiety   . Depression   . Hypertension   . Hypothyroidism, postsurgical   . Aortic stenosis     s/p Bentall with bioprosthetic AVR 02/2010; Last echo (9/11): Moderate LVH, EF 45-50%, AVR functioning appropriately, aortic valve mean gradient 21, diastolic dysfunction. Chest MRA (2/13): Mild to moderate dilatation at the sinus of Valsalva at 4.1 cm, mild dilatation ascending aorta distal to the tube graft at 3.9 cm, moderate dilatation of the innominate artery a 2.1 cm;    . Hx of cardiac cath     a. LHC in 02/2010: normal cors  . Hx of echocardiogram 2014    Echo (10/14): Severe LVH, EF  60-65%, normal wall motion, grade 1 diastolic dysfunction, AVR functioning normally, mild aortic stenosis (mean 19), moderately dilated aorta, mild LAE, mild RVE  . Staphylococcus aureus bacteremia   . Prosthetic valve endocarditis   . Thyroid cancer   . Heart murmur   . ESRD on dialysis     "[place]; TTS" (10/22/2013)   Past Surgical History  Procedure Laterality Date  . Cardiac catheterization  03/02/2010    NORMAL CORONARY ARTERY  . Sternotomy      REDO  . Transthoracic echocardiogram  03/2010    SHOWED MILD REDUCTION OF LV FUNCTION  . Thyroidectomy  ~ 2005  . Shoulder arthroscopy w/ rotator cuff repair Right 2012  . Av fistula placement Left 10/08/2013    Procedure: ARTERIOVENOUS (AV) FISTULA CREATION- LEFT ARM; Radial Cephalic ;  Surgeon: Mal [name], MD;  Location: [place];  Service: Vascular;  Laterality: Left;  . Aortic valve replacement  2011  . Cardiac valve replacement    . Aortic valve repair  1968   Family History  Problem Relation Age of Onset  . Heart disease Father    Social History:  reports that he has never smoked. He has never used smokeless tobacco. He reports that he does not drink alcohol or use illicit drugs. Allergies  Allergen Reactions  . Oxycodone     Gives patient nightmares  . Rifampin Nausea Only   Prior to Admission medications   Medication Sig Start Date End Date Taking? Authorizing Provider  acetaminophen (TYLENOL)  500 MG tablet Take 500 mg by mouth every 6 (six) hours as needed for mild pain or fever.   Yes Historical Provider, MD  aspirin EC 81 MG tablet Take 81 mg by mouth every other day.   Yes Historical Provider, MD  clonazePAM (KLONOPIN) 1 MG tablet Take 1 mg by mouth 2 (two) times daily as needed for anxiety.   Yes Historical Provider, MD  ENSURE PLUS (ENSURE PLUS) LIQD Take 237 mLs by mouth 2 (two) times daily between meals.   Yes Historical Provider, MD  levothyroxine (SYNTHROID, LEVOTHROID) 200 MCG tablet Take 200 mcg by mouth  daily before breakfast.   Yes Historical Provider, MD  rifampin (RIFADIN) 300 MG capsule Take 600 mg by mouth daily.   Yes Historical Provider, MD  sertraline (ZOLOFT) 50 MG tablet Take 50 mg by mouth daily.   Yes Historical Provider, MD  sevelamer carbonate (RENVELA) 800 MG tablet Take 800-1,600 mg by mouth 3 (three) times daily with meals. Takes 1600mg  with larger amounts of food and 800mg  with smaller amounts of food   Yes Historical Provider, MD   Current Facility-Administered Medications  Medication Dose Route Frequency Provider Last Rate Last Dose  . acetaminophen (TYLENOL) tablet 650 mg  650 mg Oral Q6H PRN Orson Eva, MD   650 mg at 10/23/13 [id number]   Or  . acetaminophen (TYLENOL) suppository 650 mg  650 mg Rectal Q6H PRN Orson Eva, MD      . Derrill Memo ON 10/24/2013] aspirin EC tablet 81 mg  81 mg Oral Roberto Scales, MD      . calcium carbonate (TUMS - dosed in mg elemental calcium) chewable tablet 400 mg of elemental calcium  400 mg of elemental calcium Oral TID PRN Rhetta Mura Schorr, NP   400 mg of elemental calcium at 10/22/13 2259  . ceFAZolin (ANCEF) IVPB 2 g/50 mL premix  2 g Intravenous Q M,W,F-HD Orson Eva, MD   2 g at 10/23/13 0031  . clonazePAM (KLONOPIN) tablet 1 mg  1 mg Oral BID PRN Orson Eva, MD   1 mg at 10/22/13 2348  . feeding supplement (ENSURE COMPLETE) (ENSURE COMPLETE) liquid 237 mL  237 mL Oral BID BM Orson Eva, MD      . heparin injection 5,000 Units  5,000 Units Subcutaneous 3 times per day Orson Eva, MD      . levothyroxine (SYNTHROID, LEVOTHROID) tablet 200 mcg  200 mcg Oral QAC breakfast Orson Eva, MD      . ondansetron [facility]) tablet 4 mg  4 mg Oral Q6H PRN Orson Eva, MD       Or  . ondansetron (ZOFRAN) injection 4 mg  4 mg Intravenous Q6H PRN Orson Eva, MD      . sertraline (ZOLOFT) tablet 50 mg  50 mg Oral Daily Shanon Brow Tat, MD      . sevelamer carbonate (RENVELA) tablet 800-1,600 mg  800-1,600 mg Oral TID WC Orson Eva, MD   1,600 mg at 10/23/13 [id number]  . zolpidem  (AMBIEN) tablet 5 mg  5 mg Oral Once Jeryl Columbia, NP       Labs: Basic Metabolic Panel:  Recent Labs Lab 10/22/13 2226  NA 135*  K 3.7  CL 96  CO2 25  GLUCOSE 116*  BUN 13  CREATININE 4.19*  CALCIUM 8.7  CBC:  Recent Labs Lab 10/22/13 2226  WBC 13.1*  HGB 10.0*  HCT 30.1*  MCV 91.5  PLT 258   Cardiac Enzymes:  Recent Labs  Lab 10/23/13 0254 10/23/13 0802  TROPONINI 0.67* 0.60*  Studies/Results: Dg Chest 2 View  10/23/2013   CLINICAL DATA:  Chest pain  EXAM: CHEST  2 VIEW  COMPARISON:  None.  FINDINGS: Left dialysis catheter removed. New right internal jugular tunneled dialysis catheter. Tip in the right atrium. No pneumothorax. Left basilar atelectasis improved. Lung volumes improved.  IMPRESSION: New right internal jugular dialysis catheter without pneumothorax. Tip is in the right atrium.  Improved left base atelectasis.   Electronically Signed   By: Maryclare Bean M.D.   On: 10/23/2013 08:05   Ct Chest W Contrast  10/23/2013   CLINICAL DATA:  MSSA bacteremia with endocarditis. New sternoclavicular tenderness and manubrial tenderness.  EXAM: CT CHEST WITH CONTRAST  TECHNIQUE: Multidetector CT imaging of the chest was performed during intravenous contrast administration.  CONTRAST:  24mL OMNIPAQUE IOHEXOL 300 MG/ML  SOLN  COMPARISON:  09/06/2013 abdominal CT  FINDINGS: THORACIC INLET/BODY WALL:  Right IJ dialysis catheter, tip at the upper right atrium. Thyroidectomy.  MEDIASTINUM:  Mild cardiomegaly. There is been previous aortic valve replacement and aortic root replacement. There is a new lobulated saccular outpouching arising near the right coronary cusp, which measures approximately 25 mm in diameter. The collection extends anteriorly and superiorly. The surrounding fat is newly infiltrated. There is no pericardial effusion. Coronary artery atherosclerosis. Subendocardial low-attenuation at the mildly thin left ventricular apex suggest previous subendocardial infarction.   Mild mediastinal lymphadenopathy which is likely reactive in the setting of endocarditis.  42 mm diameter aorta just distal to the aortic graft. Diameter stable from 2013 MRI.  LUNG WINDOWS:  No consolidation or edema. Mild dependent atelectasis. Trace left pleural effusion which appears simple.  UPPER ABDOMEN:  Coarse calcification in the posterior segment right lobe liver, likely from previous granulomatous disease. No evidence of embolic phenomenon to the imaged upper abdominal viscera.  OSSEOUS:  No evidence of septic arthritis or osteomyelitis at the site of sternal and manubrial tenderness. Remote left-sided rib fractures.  Critical Value/emergent results were called by telephone at the time of interpretation on 10/23/2013 at 10:18 AM to Dr. Sloan Leiter, who verbally acknowledged these results.  IMPRESSION: 1. 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 Recommended gated cardiac CT or echocardiography. 2. No evidence of septic arthritis or osteomyelitis to explain sternoclavicular tenderness. 3. 42 mm diameter aorta just distal to the aortic graft. Diameter stable from 2013 MRI.   Electronically Signed   By: Jorje Guild M.D.   On: 10/23/2013 10:34    ROS: As per HPI otherwise negative  Physical Exam: Filed Vitals:   10/22/13 1826 10/22/13 2149 10/23/13 0420  BP: 123/79 135/83 112/61  Pulse: 90 83 86  Temp: 98.5 F (36.9 C) 99.2 F (37.3 C) 99.3 F (37.4 C)  TempSrc: Oral Oral Oral  Resp: 20 18 20   Height: 6' 1.5" (1.867 m)    Weight: 84.414 kg (186 lb 1.6 oz)    SpO2: 98% 95% 95%     General: Well developed, pale male supine in bed Head: Normocephalic, atraumatic, sclera non-icteric, mucus membranes are moist Neck: Supple. JVD not elevated. Chest - nontender sternum and [place] joint - no erythema Lungs: Dim BS, poor expansion no overt rales. Breathing is unlabored. Heart: RRR with + valve click 3/6 murmur in upper sternal border Abdomen: Soft,  non-tender, non-distended with active bowel sounds. No rebound/guarding. No obvious abdominal masses. Lower extremities: without edema or ischemic changes, no open wounds  Neuro: Alert and oriented  X 3. Moves all extremities spontaneously. Psych:  Responds to questions appropriately with somewhat flat affect, became more animated when we talked about shodding horses Dialysis Access: right I-J TDC, maturing left lower AVF + bruit Diatek exit site is clear without drainage or erythema  Dialysis Orders: Center:[facility] TTS 4 hours180  EDW 86 400/800 3K 2.25 Ca right I-J Epo 10K heparin 8600 no venofer or hectorol left lower AVF placed 3/11 Recent labs:  Ferritin 836 3/14 Fe 41 - no tsat Hgb 10.1 iPTH 175 3/11  Assessment/Plan: 1. Possible osteomyelitis left sternoclavicular joint/sternal wound infection - CT neg for infection; to start Ancef 2. Hx posthetic AV endocarditis and septic emboli MSSA -  ID recommends holding rifampin due to GI effections; plan to start on Ancef; Chest CT today shows large paravalvular abscess/pseudoaneurysm in the region of the right coronary cusp- gated CT recommended 3. ESRD -  TTS - HD today 4 K bath; recent new start to dialysis (previous AKI from gent vs immune complex disease from infection progressed to ESRD during last admission); maturing AVF  4. Hypertension/volume  - post HD weights have been in th 85 - 86 range BP controlled without meds 5. Anemia  - Hgb stable 10, Fe studies look low but not on IV Fe - will not add yet continue ESA 100 6. Metabolic bone disease -  Continue with renvela - his wife takes care or his meds 7. Nutrition - renal diet - can liberalize if intake doesn't improve; Ensure plus ordered - high in K if that becomes an issue can change - added multivit 8. Hypothyroidism on synthroid TSH 10.1 2/15 - may want to recheck 9. Depression/anxiety on zoloft 50 /klonopin 1 mg bid prn- may need to increase dose 10. Hx NSTEMI 08/2013 - EF 45 - 50%  Myriam Jacobson, PA-C [place] [phone] 10/23/2013, 12:02 PM   Pt seen, examined, agree w assess/plan as above with additions as indicated. Patient was in hospital recently with prolonged stay for MSSA bacteremia and presumed infectious endocarditis, hx of AoV replacement. TEE not done as pt was too unstable. He developed AKI from gent vs other and never regained function, was d/c'd on hemodialysis via Diatek and an AVF was done on the left arm on 09/10/13.  He was very confused for a long time but slowly regained much of the cognition that was lost. Just completed AB course of IV Vanc and po rifampin two days ago. Admitted now with painful, tender and swollen sternoclavicular joint, r/o bony infection related to recent bacteremic episode.  Dr Lorrene Reid who is his nephrologist at [facility] reports that he has been agitating frequently, yelling out and having severe anxiety at his outpt dialysis sessions.  HD today.  Will follow. Kelly Splinter MD pager [phone]    cell [phone] 10/23/2013, 3:56 PM

## 2013-10-23 NOTE — Progress Notes (Signed)
INITIAL NUTRITION ASSESSMENT  DOCUMENTATION CODES Per approved criteria  -Severe malnutrition in the context of chronic illness   INTERVENTION: Continue Ensure Complete PO BID - pt does not like Nepro or [employer]. Recommend renal vitamin. RD to continue to follow nutrition care plan.  NUTRITION DIAGNOSIS: Inadequate oral intake related to poor appetite AEB patient report and weight loss.  Goal: Intake to meet >90% of estimated nutrition needs.  Monitor:  Weights, labs, intake   Reason for Assessment: Malnutrition Screening Tool  58 y.o. male  Admitting Dx: septic arthritis  ASSESSMENT: PMHx significant for aortic stenosis, chronic LBBB, thyroid CA post thyroidectomy, ESRD (on HD), HTN, and depression. Recently discharged 3/13 after a one month stay for sepsis, MSSA bacteremia septic brain emboli and presumptive prosthetic valve infective endocarditis. New start to HD as of 2/21. Admitted with septic arthritis and sternal infection/osteomyelitis seeding from his previous bacteremia.  Seen by TCTS - pt with possible osteomyelitis of left sternoclavicular joint or sternal wound infection.   Pt was followed by RD staff during acute hospitalization. Pt was given [employer] supplements.  Pt with 7% wt loss since last admission. Pt's oral intake during previous hospitalization was poor, pt was consuming on average 50% of meals. Pt confirms that when he was at home, he was eating minimally as well, trying to "take it slow." Currently ordered for Ensure Complete PO BID and Renal diet.  Nutrition Focused Physical Exam:  Subcutaneous Fat:  Orbital Region: WNL Upper Arm Region: moderate depletion Thoracic and Lumbar Region: moderate depletion  Muscle:  Temple Region: WNL Clavicle Bone Region: moderate depletion Clavicle and Acromion Bone Region: n/a Scapular Bone Region: n/a Dorsal Hand: n/a Patellar Region: WNL Anterior Thigh Region: moderate depletion Posterior  Calf Region: moderate depletion  Edema: none  Pt meets criteria for severe MALNUTRITION in the context of chronic illness as evidenced by intake of <75% x at least 1 month, 7% wt loss x 1 month.  Sodium is low at 135 Potassium WNL  Height: Ht Readings from Last 1 Encounters:  10/22/13 6' 1.5" (1.867 m)    Weight: Wt Readings from Last 1 Encounters:  10/22/13 186 lb 1.6 oz (84.414 kg)    Ideal Body Weight: 184 lb   % Ideal Body Weight: 101%  Wt Readings from Last 10 Encounters:  10/22/13 186 lb 1.6 oz (84.414 kg)  10/22/13 192 lb (87.091 kg)  10/10/13 189 lb 6 oz (85.9 kg)  10/10/13 189 lb 6 oz (85.9 kg)  09/06/13 200 lb (90.719 kg)  05/07/13 208 lb (94.348 kg)  05/06/13 204 lb 6.4 oz (92.715 kg)  04/09/13 203 lb (92.08 kg)  12/13/12 203 lb (92.08 kg)  11/06/12 204 lb (92.534 kg)    Usual Body Weight: 200 lb   % Usual Body Weight: 93%  BMI:  Body mass index is 24.22 kg/(m^2). WNL  Estimated Nutritional Needs: Kcal: 2200-2400 Protein: 115-130 g Fluid: 1.2 L/day  Skin: L arm incision  Diet Order: Renal  EDUCATION NEEDS: -No education needs identified at this time   Intake/Output Summary (Last 24 hours) at 10/23/13 1119 Last data filed at 10/23/13 0031  Gross per 24 hour  Intake    702 ml  Output      0 ml  Net    702 ml    Last BM: 3/25  Labs:   Recent Labs Lab 10/22/13 2226  NA 135*  K 3.7  CL 96  CO2 25  BUN 13  CREATININE 4.19*  CALCIUM 8.7  GLUCOSE 116*    CBG (last 3)  No results found for this basename: GLUCAP,  in the last 72 hours  Scheduled Meds: . [START ON 10/24/2013] aspirin EC  81 mg Oral QODAY  .  ceFAZolin (ANCEF) IV  2 g Intravenous Q M,W,F-HD  . feeding supplement (ENSURE COMPLETE)  237 mL Oral BID BM  . heparin  5,000 Units Subcutaneous 3 times per day  . levothyroxine  200 mcg Oral QAC breakfast  . sertraline  50 mg Oral Daily  . sevelamer carbonate  800-1,600 mg Oral TID WC  . zolpidem  5 mg Oral Once     Continuous Infusions:    Past Medical History  Diagnosis Date  . Anxiety   . Depression   . Hypertension   . Hypothyroidism, postsurgical   . Aortic stenosis     s/p Bentall with bioprosthetic AVR 02/2010; Last echo (9/11): Moderate LVH, EF 45-50%, AVR functioning appropriately, aortic valve mean gradient 21, diastolic dysfunction. Chest MRA (2/13): Mild to moderate dilatation at the sinus of Valsalva at 4.1 cm, mild dilatation ascending aorta distal to the tube graft at 3.9 cm, moderate dilatation of the innominate artery a 2.1 cm;    . Hx of cardiac cath     a. LHC in 02/2010: normal cors  . Hx of echocardiogram 2014    Echo (10/14): Severe LVH, EF 60-65%, normal wall motion, grade 1 diastolic dysfunction, AVR functioning normally, mild aortic stenosis (mean 19), moderately dilated aorta, mild LAE, mild RVE  . Staphylococcus aureus bacteremia   . Prosthetic valve endocarditis   . Thyroid cancer   . Heart murmur   . ESRD on dialysis     "[place]; TTS" (10/22/2013)    Past Surgical History  Procedure Laterality Date  . Cardiac catheterization  03/02/2010    NORMAL CORONARY ARTERY  . Sternotomy      REDO  . Transthoracic echocardiogram  03/2010    SHOWED MILD REDUCTION OF LV FUNCTION  . Thyroidectomy  ~ 2005  . Shoulder arthroscopy w/ rotator cuff repair Right 2012  . Av fistula placement Left 10/08/2013    Procedure: ARTERIOVENOUS (AV) FISTULA CREATION- LEFT ARM; Radial Cephalic ;  Surgeon: Mal [name], MD;  Location: [place];  Service: Vascular;  Laterality: Left;  . Aortic valve replacement  2011  . Cardiac valve replacement    . Aortic valve repair  1968    Inda Coke MS, RD, LDN Inpatient Registered Dietitian Pager: [phone] After-hours pager: [phone]

## 2013-10-23 NOTE — Care Management Note (Signed)
Page 1 of 2   11/13/2013     3:42:34 PM   CARE MANAGEMENT NOTE 11/13/2013  Patient:  Bradley [name]   Account Number:  [id number]  Date Initiated:  10/23/2013  Documentation initiated by:  Tomi Bamberger  Subjective/Objective Assessment:   dx sternoclavicular infection  admit- lives with family.     Action/Plan:   3/31 surgery for endocarditis   Anticipated DC Date:  11/12/2013   Anticipated DC Plan:  [place]  CM consult      [facility] Choice  HOME HEALTH   Choice offered to / List presented to:  C-1 Patient   DME arranged  [place]      DME agency  [place] arranged  HH-1 RN  [place]      [place].   Status of service:  In process, will continue to follow Medicare Important Message given?  NA - LOS <3 / Initial given by admissions (If response is "NO", the following Medicare IM given date fields will be blank) Date Medicare IM given:   Date Additional Medicare IM given:    Discharge Disposition:  [place]  Per UR Regulation:  Reviewed for med. necessity/level of care/duration of stay  If discussed at [place] of Stay Meetings, dates discussed:   10/30/2013  11/04/2013  11/06/2013  11/11/2013  11/13/2013    Comments:  Contact:  Sawatzky,[name] Spouse [phone]                  Termine,Herb Son     [phone]  11/13/13 Jari Carollo,RN,BSN [phone] PT FOR DC HOME TODAY WITH FAMILY AND HH SERVICES AS ARRANGED. NOTIFIED [place].  DME DELIVERED TO PT'S ROOM PRIOR TO DC.  11/11/13 Gable Odonohue,RN,BSN [phone] MET WITH PT, DAUGHTER AND SON TO DISCUSS DC PLANS.  CIR HAS DECLINED ADMISSION, AND PT HAS NO BED OFFERS FOR SNF.  PT WALKED 500 FEET WITH [facility] REHAB TODAY; FEEL PT IS SAFE TO GO HOME WITH FAMILY AND HH SERVICES AS PRIOR TO ADMISSION. SON STATES THAT BETWEEN ALL  FAMILY MEMBERS, PT WILL HAVE 24H SUPERVISION AT ALL TIMES.  WILL RESUME HH SERVICES WITH [facility], PER PT CHOICE; PT REQUESTS RW AND 3 IN 1 FOR HOME. PT/FAMILY HAPPY WITH DC PLAN/ARRANGEMENTS.  WILL ADD CSW TO HH ORDERS FOR COUNSELING R/T DEPRESSION/ANXIETY DISORDER.  11-06-13 9:10am Luz Lex, RNBSN - [phone] Off dopamine - plan for tx to progressive unit.  HD last three days - Rehab suggesting already, may be too good to go to inhouse rehab.  Will need to move soon.  11-05-13 10:15am Luz Lex, RNBSN [phone] Wife called back last evening.  Will be home with him 24/7 on discharge initially.  Would like him to go to inpt rehab if able rather then ST rehab in SNF.  11-05-23 3pm Luz Lex, RNBSN [phone] No call back from wife.  Left message again at [phone]. Notified nurse to call me if patient's wife came in.  Wife has not been in - has called this am.  Verified number left is not the number I have called.  Tried calling wife on [phone] - no answer - left message.  11-03-13 8:30am [place] [phone] Extubated - still on  low dose dopamine - trying to wean - will need 8 weeks of antibiotics post op per ID.  Left message for wife to call back.  10-29-13 9:30am [place] [phone] Post op bentall  10/27/13 Tomi Bamberger RN, BSN [phone] patient with endocarditis, plan for surgery on 10/28/13.

## 2013-10-24 ENCOUNTER — Encounter (HOSPITAL_COMMUNITY)
Admission: AD | Disposition: A | Discharge status: Home Health | Payer: Self-pay | Source: Ambulatory Visit | Attending: Cardiothoracic Surgery

## 2013-10-24 DIAGNOSIS — N186 End stage renal disease: Secondary | ICD-10-CM

## 2013-10-24 DIAGNOSIS — E039 Hypothyroidism, unspecified: Secondary | ICD-10-CM

## 2013-10-24 DIAGNOSIS — R7881 Bacteremia: Secondary | ICD-10-CM

## 2013-10-24 DIAGNOSIS — I10 Essential (primary) hypertension: Secondary | ICD-10-CM

## 2013-10-24 DIAGNOSIS — I059 Rheumatic mitral valve disease, unspecified: Secondary | ICD-10-CM

## 2013-10-24 DIAGNOSIS — T827XXA Infection and inflammatory reaction due to other cardiac and vascular devices, implants and grafts, initial encounter: Secondary | ICD-10-CM

## 2013-10-24 HISTORY — PX: TEE WITHOUT CARDIOVERSION: SHX5443

## 2013-10-24 SURGERY — ECHOCARDIOGRAM, TRANSESOPHAGEAL
Anesthesia: Moderate Sedation

## 2013-10-24 MED ORDER — MIDAZOLAM HCL 10 MG/2ML IJ SOLN
INTRAMUSCULAR | Status: DC | PRN
  Administered 2013-10-24: 1 mg via INTRAVENOUS
  Administered 2013-10-24 (×2): 2 mg via INTRAVENOUS

## 2013-10-24 MED ORDER — FENTANYL CITRATE 0.05 MG/ML IJ SOLN
INTRAMUSCULAR | Status: AC
  Filled 2013-10-24: qty 2

## 2013-10-24 MED ORDER — SODIUM CHLORIDE 0.9 % IV SOLN: INTRAVENOUS | Status: DC

## 2013-10-24 MED ORDER — FENTANYL CITRATE 0.05 MG/ML IJ SOLN
INTRAMUSCULAR | Status: DC | PRN
  Administered 2013-10-24 (×2): 25 ug via INTRAVENOUS

## 2013-10-24 MED ORDER — BUTAMBEN-TETRACAINE-BENZOCAINE 2-2-14 % EX AERO
INHALATION_SPRAY | CUTANEOUS | Status: DC | PRN
  Administered 2013-10-24: 2 via TOPICAL

## 2013-10-24 MED ORDER — MIDAZOLAM HCL 5 MG/ML IJ SOLN
INTRAMUSCULAR | Status: AC
  Filled 2013-10-24: qty 2

## 2013-10-24 NOTE — H&P (View-Only) (Signed)
[facility]  Date of Admission:  10/22/2013  Antibiotics: Cefazolin day 2 Total antibiotics day 46  Subjective: No complaints  Objective: Temp:  [98.7 F (37.1 C)-99.6 F (37.6 C)] 98.7 F (37.1 C) (03/27 1402) Pulse Rate:  [78-101] 84 (03/27 1402) Resp:  [18-20] 20 (03/27 1402) BP: (98-127)/(61-85) 122/71 mmHg (03/27 1402) SpO2:  [96 %-97 %] 96 % (03/27 1402) Weight:  [184 lb 4.9 oz (83.6 kg)] 184 lb 4.9 oz (83.6 kg) (03/26 1803)  General: awake, nad  Skin: no rashes Lungs: CTA B Cor: RRR without m Abdomen: soft, nt, nd Ext; no edema  Lab Results Lab Results  Component Value Date   WBC 3.3* 10/23/2013   HGB 9.0* 10/23/2013   HCT 27.6* 10/23/2013   MCV 91.1 10/23/2013   PLT 221 10/23/2013    Lab Results  Component Value Date   CREATININE 3.65* 10/23/2013   BUN 12 10/23/2013   NA 136* 10/23/2013   K 3.7 10/23/2013   CL 97 10/23/2013   CO2 28 10/23/2013    Lab Results  Component Value Date   ALT 16 09/25/2013   AST 22 09/25/2013   ALKPHOS 50 09/25/2013   BILITOT 0.6 09/25/2013      Microbiology: Recent Results (from the past 240 hour(s))  CLOSTRIDIUM DIFFICILE BY PCR     Status: None   Collection Time    10/22/13 10:04 PM      Result Value Ref Range Status   C difficile by pcr NEGATIVE  NEGATIVE Final  CULTURE, BLOOD (ROUTINE X 2)     Status: None   Collection Time    10/22/13 10:26 PM      Result Value Ref Range Status   Specimen Description BLOOD RIGHT ARM   Final   Special Requests BOTTLES DRAWN AEROBIC ONLY 3CC   Final   Culture  Setup Time     Final   Value: 10/23/2013 04:42     Performed at [facility]   Culture     Final   Value:        BLOOD CULTURE RECEIVED NO GROWTH TO DATE CULTURE WILL BE HELD FOR 5 DAYS BEFORE ISSUING A FINAL NEGATIVE REPORT     Performed at [facility]   Report Status PENDING   Incomplete  CULTURE, BLOOD (ROUTINE X 2)     Status: None   Collection Time    10/22/13 10:34 PM   Result Value Ref Range Status   Specimen Description BLOOD RIGHT HAND   Final   Special Requests BOTTLES DRAWN AEROBIC ONLY 6CC   Final   Culture  Setup Time     Final   Value: 10/23/2013 04:42     Performed at [facility]   Culture     Final   Value:        BLOOD CULTURE RECEIVED NO GROWTH TO DATE CULTURE WILL BE HELD FOR 5 DAYS BEFORE ISSUING A FINAL NEGATIVE REPORT     Performed at [facility]   Report Status PENDING   Incomplete    Studies/Results: Dg Chest 2 View  10/23/2013   CLINICAL DATA:  Chest pain  EXAM: CHEST  2 VIEW  COMPARISON:  None.  FINDINGS: Left dialysis catheter removed. New right internal jugular tunneled dialysis catheter. Tip in the right atrium. No pneumothorax. Left basilar atelectasis improved. Lung volumes improved.  IMPRESSION: New right internal jugular dialysis catheter without pneumothorax. Tip is in the right atrium.  Improved left base atelectasis.   Electronically Signed   By: Maryclare Bean M.D.   On: 10/23/2013 08:05   Ct Chest W Contrast  10/23/2013   CLINICAL DATA:  MSSA bacteremia with endocarditis. New sternoclavicular tenderness and manubrial tenderness.  EXAM: CT CHEST WITH CONTRAST  TECHNIQUE: Multidetector CT imaging of the chest was performed during intravenous contrast administration.  CONTRAST:  79mL OMNIPAQUE IOHEXOL 300 MG/ML  SOLN  COMPARISON:  09/06/2013 abdominal CT  FINDINGS: THORACIC INLET/BODY WALL:  Right IJ dialysis catheter, tip at the upper right atrium. Thyroidectomy.  MEDIASTINUM:  Mild cardiomegaly. There is been previous aortic valve replacement and aortic root replacement. There is a new lobulated saccular outpouching arising near the right coronary cusp, which measures approximately 25 mm in diameter. The collection extends anteriorly and superiorly. The surrounding fat is newly infiltrated. There is no pericardial effusion. Coronary artery atherosclerosis. Subendocardial low-attenuation at the mildly thin left ventricular  apex suggest previous subendocardial infarction.  Mild mediastinal lymphadenopathy which is likely reactive in the setting of endocarditis.  42 mm diameter aorta just distal to the aortic graft. Diameter stable from 2013 MRI.  LUNG WINDOWS:  No consolidation or edema. Mild dependent atelectasis. Trace left pleural effusion which appears simple.  UPPER ABDOMEN:  Coarse calcification in the posterior segment right lobe liver, likely from previous granulomatous disease. No evidence of embolic phenomenon to the imaged upper abdominal viscera.  OSSEOUS:  No evidence of septic arthritis or osteomyelitis at the site of sternal and manubrial tenderness. Remote left-sided rib fractures.  Critical Value/emergent results were called by telephone at the time of interpretation on 10/23/2013 at 10:18 AM to Dr. Sloan Leiter, who verbally acknowledged these results.  IMPRESSION: 1. 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 Recommended gated cardiac CT or echocardiography. 2. No evidence of septic arthritis or osteomyelitis to explain sternoclavicular tenderness. 3. 42 mm diameter aorta just distal to the aortic graft. Diameter stable from 2013 MRI.   Electronically Signed   By: Jorje Guild M.D.   On: 10/23/2013 10:34    Assessment/Plan: 58 y.o. male with Past medical history of aortic valve replacement in 2008 and with aortic root correction in 2011, hypothyroidism, hypertension, depression.  Developed MSSA bacteremia and TTE done but did not show evidence of vegetation.  He though had findings of septic emboli to brain and treated for 6 weeks for PVE.  He did not tolerate gentamicin and treated with nafcillin with rifampin until discharge was changed to vancomycin with rifampin with dialysis.  He came in for follow up at the end of 6 weeks and not feeling well and sent in to hospital with CT scan suggesting large paravalvular abscess vs pseudoaneurysm.    1) ? Abscess - awaiting TEE.   On cefazolin for presumed MSSA.  Blood cultures remain negative.    Dr. Johnnye Sima will follow up over the weekend.    Scharlene Gloss, [facility] [place] [email or web address] [phone] pager   [phone] cell 10/24/2013, 2:42 PM

## 2013-10-24 NOTE — Interval H&P Note (Signed)
History and Physical Interval Note:  10/24/2013 3:29 PM  Bradley [name]  has presented today for surgery, with the diagnosis of r/o endocarditis  The various methods of treatment have been discussed with the patient and family. After consideration of risks, benefits and other options for treatment, the patient has consented to  Procedure(s): TRANSESOPHAGEAL ECHOCARDIOGRAM (TEE) (N/A) as a surgical intervention .  The patient's history has been reviewed, patient examined, no change in status, stable for surgery.  I have reviewed the patient's chart and labs.  Questions were answered to the patient's satisfaction.     Dorothy Spark

## 2013-10-24 NOTE — Progress Notes (Signed)
Subjective: Chest CT showed prob paravalvular abcess, for TEE today  Filed Vitals:   10/23/13 1800 10/23/13 1803 10/23/13 2051 10/24/13 0430  BP: 105/65 116/74 123/85 121/71  Pulse: 86 91 101 90  Temp:  98.8 F (37.1 C) 99.6 F (37.6 C) 98.8 F (37.1 C)  TempSrc:  Oral Oral Oral  Resp:  18 18 18   Height:      Weight:  83.6 kg (184 lb 4.9 oz)    SpO2:  97% 96% 96%   Exam: Alert, in no distress, calm No jvd Chest is clear bilat RRR 3/6 SEM no RG Abd soft, NTND, flat No LE edema  R IJ Diatek in place, L forearm AVF maturing and patent Neuro is fully oriented, tearful discussing renal failure   Dialysis: TTS [facility] 4h    86kg    3K/2.25 [place]   R IJ Diatek (maturing LFA AVF 3/11) Heparin 8600 OP lab: 10.1/no tsat / 836    pth 175   Assessment: 1 Abnormal chest CT / suspected paravalvular abcess vs pseudoaneurysm 2 ESRD on hemodialysis- had AKI last admission from gent / ATN vs immune complex (not biopsied); not optimistic about renal recovery but there is a chance still, have d/w patient 3 Recent MSSA bacteremia w septic embolic and suspected endocarditis 4 Hx AoV replacement d/t congenital abnormality 5 Volume - stable, below dry wt 6 Anemia Hb 9 on darbe 100/wk 7 HTN/volume- not on any BP meds, BP low-normal, no extra vol  Plan- for TEE today, HD tomorrow    Kelly Splinter MD  pager [phone]    cell [phone]  10/24/2013, 11:32 AM     Recent Labs Lab 10/22/13 2226 10/23/13 1411  NA 135* 136*  K 3.7 3.7  CL 96 97  CO2 25 28  GLUCOSE 116* 114*  BUN 13 12  CREATININE 4.19* 3.65*  CALCIUM 8.7 8.4  PHOS  --  3.9    Recent Labs Lab 10/23/13 1411  ALBUMIN 1.8*    Recent Labs Lab 10/22/13 2226 10/23/13 1411  WBC 13.1* 3.3*  HGB 10.0* 9.0*  HCT 30.1* 27.6*  MCV 91.5 91.1  PLT 258 221   . aspirin EC  81 mg Oral QODAY  .  ceFAZolin (ANCEF) IV  2 g Intravenous Q M,W,F-HD  . darbepoetin (ARANESP) injection - DIALYSIS  100 mcg Intravenous Q Thu-HD   . feeding supplement (ENSURE COMPLETE)  237 mL Oral BID BM  . heparin  5,000 Units Subcutaneous 3 times per day  . levothyroxine  200 mcg Oral QAC breakfast  . multivitamin  1 tablet Oral QHS  . sertraline  50 mg Oral Daily  . sevelamer carbonate  800-1,600 mg Oral TID WC  . zolpidem  5 mg Oral Once     acetaminophen, acetaminophen, calcium carbonate, clonazePAM, ondansetron (ZOFRAN) IV, ondansetron

## 2013-10-24 NOTE — Progress Notes (Signed)
PATIENT DETAILS Name: Bradley [name] Age: 58 y.o. Sex: male Date of Birth: 13-Jan-1956 Admit Date: 10/22/2013 Admitting Physician Kelvin Cellar, MD MJ:[id number] W, MD  Brief summary: 58 year old man with hx of prosthetic aortic valve replacement in 2008 and with aortic root correction in 2011 who was admitted on February 10th with severe MSSA bacteremia and endocarditis with septic emboli to brain. He was initally broadly treated then appropriately narrowed to nafcillin, gentamicin and rifampin. His blood cultures cleared on 2/11/5. He unfortunately developed renal failure and gentamicin was held. He was treated with IV nafcillin and IV rifampin 300 mg IV q 8 hours thru end of February when rifampin changed to 600mg  daily.He was ultimately discharged on 10/10/13 with vancomycin and oral rifampin because he lost IV access besided HD site. HE has AV fistula created in left forearm for HD. His last dose of vanocomcyin on 3/24.  He was seen at the infectious disease clinic on 3/25 weight complained of pain in his chest with deep breathing, and on exam he was found to have tenderness and erythema on the left sternoclavicular joint area. He was then referred to the hospitalist service for further evaluation and treatment. 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  Subjective: No major issues overnight.Tearful hearing that he may have a paravalvular leak  Assessment/Plan: Suspected large paravalvular abscess/pseudoaneurysm in the region of the right coronary cusp - Recent history of MSSA bacteremia, continue Ancef-day 3. Was already on antibiotics prior to this admission - blood cultures done on 3/25 negative so far. - Cardiology pending TEE on 3/27, cardiothoracic surgery and infectious disease  consulted.  Suspected prosthetic valve MSSA endocarditis with septic emboli - Please see above for further details. Patient was discharged on 3/13 with IV vancomycin and oral rifampin, he was treated for presumed prosthetic valve endocarditis with septic emboli to the brain. - Unfortunately despite continued IV antibiotics, it looks like he has developed further complications with a CT of the chest done on 3/26 showing a possible large paravalvular abscess/pseudoaneurysm in the region of the right coronary cusp.TEE scheduled for 3/27.  End-stage renal disease - Secondary to ATN from gentamicin or immune complex deposition from endocarditis. - Nephrology on board for hemodialysis.  Positive troponins - Likely secondary to paravalvular abscess/pseudoaneurysm rather than non-STEMI. - Cardiology following.  Recent history of toxic metabolic encephalopathy - From review of his prior admission records, his mental status seems to have improved significantly. Continue to monitor.  Hypothyroidism - Continue with levothyroxine  Anemia - Likely secondary to chronic kidney disease and acute illness. Continue to monitor.  Secondary hyperparathyroidism - Will defer to nephrology  Disposition: Remain inpatient  DVT Prophylaxis: Prophylactic Heparin   Code Status: Full code   Family Communication None at bedside  Procedures:  None  CONSULTS:  cardiology, ID, nephrology and Cardiothoracic surgery  Time spent 40 minutes-which includes 50% of the time with face-to-face with patient/ family and coordinating care related to the above assessment and plan.    MEDICATIONS: Scheduled Meds: . aspirin EC  81 mg Oral QODAY  .  ceFAZolin (ANCEF) IV  2 g Intravenous Q M,W,F-HD  . darbepoetin (ARANESP) injection - DIALYSIS  100 mcg Intravenous Q Thu-HD  . feeding supplement (ENSURE COMPLETE)  237 mL Oral BID  BM  . heparin  5,000 Units Subcutaneous 3 times per day  . levothyroxine  200 mcg  Oral QAC breakfast  . multivitamin  1 tablet Oral QHS  . sertraline  50 mg Oral Daily  . sevelamer carbonate  800-1,600 mg Oral TID WC  . zolpidem  5 mg Oral Once   Continuous Infusions:  PRN Meds:.acetaminophen, acetaminophen, calcium carbonate, clonazePAM, ondansetron (ZOFRAN) IV, ondansetron  Antibiotics: Anti-infectives   Start     Dose/Rate Route Frequency Ordered Stop   10/23/13 0800  ceFAZolin (ANCEF) IVPB 1 g/50 mL premix  Status:  Discontinued     1 g 100 mL/hr over 30 Minutes Intravenous  Once 10/22/13 2313 10/22/13 2326   10/22/13 2359  ceFAZolin (ANCEF) IVPB 2 g/50 mL premix     2 g 100 mL/hr over 30 Minutes Intravenous Every M-W-F (Hemodialysis) 10/22/13 2314     10/22/13 2315  ceFAZolin (ANCEF) IVPB 2 g/50 mL premix  Status:  Discontinued     2 g 100 mL/hr over 30 Minutes Intravenous  Once 10/22/13 2313 10/22/13 2326       PHYSICAL EXAM: Vital signs in last 24 hours: Filed Vitals:   10/23/13 1803 10/23/13 2051 10/24/13 0430 10/24/13 1402  BP: 116/74 123/85 121/71 122/71  Pulse: 91 101 90 84  Temp: 98.8 F (37.1 C) 99.6 F (37.6 C) 98.8 F (37.1 C) 98.7 F (37.1 C)  TempSrc: Oral Oral Oral Oral  Resp: 18 18 18 20   Height:      Weight: 83.6 kg (184 lb 4.9 oz)     SpO2: 97% 96% 96% 96%    Weight change: 0.786 kg (1 lb 11.7 oz) Filed Weights   10/22/13 1826 10/23/13 1350 10/23/13 1803  Weight: 84.414 kg (186 lb 1.6 oz) 85.2 kg (187 lb 13.3 oz) 83.6 kg (184 lb 4.9 oz)   Body mass index is 23.98 kg/(m^2).   Gen Exam: Awake and alert with clear speech.   Neck: Supple, No JVD.   Chest: B/L Clear.   CVS: S1 S2 Regular, systolic murmur Abdomen: soft, BS +, non tender, non distended.  Extremities: no edema, lower extremities warm to touch. Neurologic: Non Focal.   Skin: No Rash.   Wounds: N/A.   Intake/Output from previous day:  Intake/Output Summary (Last 24 hours) at 10/24/13 1422 Last data filed at 10/24/13 1300  Gross per 24 hour  Intake      0  ml  Output   2246 ml  Net  -2246 ml     LAB RESULTS: CBC  Recent Labs Lab 10/22/13 2226 10/23/13 1411  WBC 13.1* 3.3*  HGB 10.0* 9.0*  HCT 30.1* 27.6*  PLT 258 221  MCV 91.5 91.1  MCH 30.4 29.7  MCHC 33.2 32.6  RDW 19.4* 19.2*    Chemistries   Recent Labs Lab 10/22/13 2226 10/23/13 1411  NA 135* 136*  K 3.7 3.7  CL 96 97  CO2 25 28  GLUCOSE 116* 114*  BUN 13 12  CREATININE 4.19* 3.65*  CALCIUM 8.7 8.4    CBG: No results found for this basename: GLUCAP,  in the last 168 hours  GFR Estimated Creatinine Clearance: 25.9 ml/min (by C-G formula based on Cr of 3.65).  Coagulation profile No results found for this basename: INR, PROTIME,  in the last 168 hours  Cardiac Enzymes  Recent Labs Lab 10/23/13 0254 10/23/13 0802 10/23/13 1411  TROPONINI 0.67* 0.60* 0.71*    No components found with this basename: POCBNP,  No results found for this basename: DDIMER,  in the last 72 hours No results found for this basename: HGBA1C,  in the last 72 hours No results found for this basename: CHOL, HDL, LDLCALC, TRIG, CHOLHDL, LDLDIRECT,  in the last 72 hours No results found for this basename: TSH, T4TOTAL, FREET3, T3FREE, THYROIDAB,  in the last 72 hours No results found for this basename: VITAMINB12, FOLATE, FERRITIN, TIBC, IRON, RETICCTPCT,  in the last 72 hours No results found for this basename: LIPASE, AMYLASE,  in the last 72 hours  Urine Studies No results found for this basename: UACOL, UAPR, USPG, UPH, UTP, UGL, UKET, UBIL, UHGB, UNIT, UROB, ULEU, UEPI, UWBC, URBC, UBAC, CAST, CRYS, UCOM, BILUA,  in the last 72 hours  MICROBIOLOGY: Recent Results (from the past 240 hour(s))  CLOSTRIDIUM DIFFICILE BY PCR     Status: None   Collection Time    10/22/13 10:04 PM      Result Value Ref Range Status   C difficile by pcr NEGATIVE  NEGATIVE Final  CULTURE, BLOOD (ROUTINE X 2)     Status: None   Collection Time    10/22/13 10:26 PM      Result Value Ref Range  Status   Specimen Description BLOOD RIGHT ARM   Final   Special Requests BOTTLES DRAWN AEROBIC ONLY 3CC   Final   Culture  Setup Time     Final   Value: 10/23/2013 04:42     Performed at [facility]   Culture     Final   Value:        BLOOD CULTURE RECEIVED NO GROWTH TO DATE CULTURE WILL BE HELD FOR 5 DAYS BEFORE ISSUING A FINAL NEGATIVE REPORT     Performed at [facility]   Report Status PENDING   Incomplete  CULTURE, BLOOD (ROUTINE X 2)     Status: None   Collection Time    10/22/13 10:34 PM      Result Value Ref Range Status   Specimen Description BLOOD RIGHT HAND   Final   Special Requests BOTTLES DRAWN AEROBIC ONLY 6CC   Final   Culture  Setup Time     Final   Value: 10/23/2013 04:42     Performed at [facility]   Culture     Final   Value:        BLOOD CULTURE RECEIVED NO GROWTH TO DATE CULTURE WILL BE HELD FOR 5 DAYS BEFORE ISSUING A FINAL NEGATIVE REPORT     Performed at [facility]   Report Status PENDING   Incomplete    RADIOLOGY STUDIES/RESULTS: Dg Chest 2 View  10/23/2013   CLINICAL DATA:  Chest pain  EXAM: CHEST  2 VIEW  COMPARISON:  None.  FINDINGS: Left dialysis catheter removed. New right internal jugular tunneled dialysis catheter. Tip in the right atrium. No pneumothorax. Left basilar atelectasis improved. Lung volumes improved.  IMPRESSION: New right internal jugular dialysis catheter without pneumothorax. Tip is in the right atrium.  Improved left base atelectasis.   Electronically Signed   By: Maryclare Bean M.D.   On: 10/23/2013 08:05   Ct Chest W Contrast  10/23/2013   CLINICAL DATA:  MSSA bacteremia with endocarditis. New sternoclavicular tenderness and manubrial tenderness.  EXAM: CT CHEST WITH CONTRAST  TECHNIQUE: Multidetector CT imaging of the chest was performed during intravenous contrast administration.  CONTRAST:  3mL OMNIPAQUE IOHEXOL 300 MG/ML  SOLN  COMPARISON:  09/06/2013 abdominal CT  FINDINGS:  THORACIC INLET/BODY  WALL:  Right IJ dialysis catheter, tip at the upper right atrium. Thyroidectomy.  MEDIASTINUM:  Mild cardiomegaly. There is been previous aortic valve replacement and aortic root replacement. There is a new lobulated saccular outpouching arising near the right coronary cusp, which measures approximately 25 mm in diameter. The collection extends anteriorly and superiorly. The surrounding fat is newly infiltrated. There is no pericardial effusion. Coronary artery atherosclerosis. Subendocardial low-attenuation at the mildly thin left ventricular apex suggest previous subendocardial infarction.  Mild mediastinal lymphadenopathy which is likely reactive in the setting of endocarditis.  42 mm diameter aorta just distal to the aortic graft. Diameter stable from 2013 MRI.  LUNG WINDOWS:  No consolidation or edema. Mild dependent atelectasis. Trace left pleural effusion which appears simple.  UPPER ABDOMEN:  Coarse calcification in the posterior segment right lobe liver, likely from previous granulomatous disease. No evidence of embolic phenomenon to the imaged upper abdominal viscera.  OSSEOUS:  No evidence of septic arthritis or osteomyelitis at the site of sternal and manubrial tenderness. Remote left-sided rib fractures.  Critical Value/emergent results were called by telephone at the time of interpretation on 10/23/2013 at 10:18 AM to Dr. Sloan Leiter, who verbally acknowledged these results.  IMPRESSION: 1. 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 Recommended gated cardiac CT or echocardiography. 2. No evidence of septic arthritis or osteomyelitis to explain sternoclavicular tenderness. 3. 42 mm diameter aorta just distal to the aortic graft. Diameter stable from 2013 MRI.   Electronically Signed   By: Jorje Guild M.D.   On: 10/23/2013 10:34   Ir Fluoro Guide Cv Line Right  09/25/2013   INDICATION: Acute renal failure. Temporary catheter needs conversion to a tunneled  dialysis catheter.  EXAM: CONVERSION OF NON TUNNELED CATHETER TO A TUNNELED DIALYSIS CATHETER WITH FLUOROSCOPIC GUIDANCE  Physician: Stephan Minister. Anselm Pancoast, MD  MEDICATIONS: 3 mg versed, 150 mcg fentanyl. The patient was already on scheduled antibiotics. A radiology nurse monitored the patient for moderate sedation.  ANESTHESIA/SEDATION: Moderate sedation time: 25 minutes  FLUOROSCOPY TIME:  24 seconds  PROCEDURE: Informed consent was obtained for conversion to a tunneled dialysis catheter. The existing right jugular catheter and right side of the neck was prepped and draped in sterile fashion. Maximal barrier sterile technique was utilized including caps, mask, sterile gowns, sterile gloves, sterile drape, hand hygiene and skin antiseptic. The skin below the right clavicle was anesthetized with 1% lidocaine. A subcutaneous tract was created and a 23 cm tip to cuff HemoSplit catheter was placed through the tract to the temporary dialysis catheter site. The temporary dialysis catheter was exchanged for a peel-away sheath over a wire. The new dialysis catheter was placed through the peel-away sheath and the tip was placed in the right atrium. Both lumens aspirated and flushed well. Appropriate amount of heparin was placed in both lumens. Gelfoam was placed within the subcutaneous tract. The vein skin site and catheter exit site were sutured with 2-0 Ethilon sutures.  COMPLICATIONS: None  FINDINGS: Catheter tip in the right atrium.  IMPRESSION: Successful conversion of a temporary dialysis catheter to a tunneled dialysis catheter with fluoroscopy.   Electronically Signed   By: Markus Daft M.D.   On: 09/25/2013 16:25    Oren Binet, MD  [facility] Pager:[phone]  If 7PM-7AM, please contact night-coverage [email or web address] Password [email or web address] 10/24/2013, 2:22 PM   LOS: 2 days

## 2013-10-24 NOTE — CV Procedure (Signed)
     Transesophageal Echocardiogram Note  TIGH KLICK FQ:[id number] 1956/03/31  Procedure: Transesophageal Echocardiogram Indications: SBE, MSSA Bacteremia  Procedure Details Consent: Obtained Time Out: Verified patient identification, verified procedure, site/side was marked, verified correct patient position, special equipment/implants available, Radiology Safety Procedures followed,  medications/allergies/relevent history reviewed, required imaging and test results available.  Performed  Medications: Fentanyl: 50 mcg Versed: 5 mg  Left Ventrical: The cavity size was mildly dilated. Wall thickness was increased in a pattern of moderate LVH. Systolic function was mildly reduced. The estimated ejection fraction was in the range of 45% to 50%.  Mitral Valve:  Mildly thickened leaflets . Doppler: Mild regurgitation. No vegetation.  Aortic Valve: Sp bioprosthetic valve and aortic root replacement (Bentall procedure), there is an echolucid cavity around the non-coronary and right coronary cusp consistent with paravalvular abscess measuring 14 x 52 mm. The inflow is most probably at the level of right coronary cusp or slightly bellow.   Tricuspid Valve: Poorly visualized. Doppler: Mild regurgitation.  Pulmonic Valve: Normal, no vegetation.   Left Atrium/ Left atrial appendage: No thrombus in the LA or LAA.   Atrial septum: No ASD or PFO.   Aorta: MIld AS plague.  Complications: No apparent complications Patient did tolerate procedure well.  Dorothy Spark, MD, [facility] 10/24/2013, 3:30 PM

## 2013-10-24 NOTE — Progress Notes (Signed)
[place] for Infectious Disease  Date of Admission:  10/22/2013  Antibiotics: Cefazolin day 2 Total antibiotics day 46  Subjective: No complaints  Objective: Temp:  [98.7 F (37.1 C)-99.6 F (37.6 C)] 98.7 F (37.1 C) (03/27 1402) Pulse Rate:  [78-101] 84 (03/27 1402) Resp:  [18-20] 20 (03/27 1402) BP: (98-127)/(61-85) 122/71 mmHg (03/27 1402) SpO2:  [96 %-97 %] 96 % (03/27 1402) Weight:  [184 lb 4.9 oz (83.6 kg)] 184 lb 4.9 oz (83.6 kg) (03/26 1803)  General: awake, nad  Skin: no rashes Lungs: CTA B Cor: RRR without m Abdomen: soft, nt, nd Ext; no edema  Lab Results Lab Results  Component Value Date   WBC 3.3* 10/23/2013   HGB 9.0* 10/23/2013   HCT 27.6* 10/23/2013   MCV 91.1 10/23/2013   PLT 221 10/23/2013    Lab Results  Component Value Date   CREATININE 3.65* 10/23/2013   BUN 12 10/23/2013   NA 136* 10/23/2013   K 3.7 10/23/2013   CL 97 10/23/2013   CO2 28 10/23/2013    Lab Results  Component Value Date   ALT 16 09/25/2013   AST 22 09/25/2013   ALKPHOS 50 09/25/2013   BILITOT 0.6 09/25/2013      Microbiology: Recent Results (from the past 240 hour(s))  CLOSTRIDIUM DIFFICILE BY PCR     Status: None   Collection Time    10/22/13 10:04 PM      Result Value Ref Range Status   C difficile by pcr NEGATIVE  NEGATIVE Final  CULTURE, BLOOD (ROUTINE X 2)     Status: None   Collection Time    10/22/13 10:26 PM      Result Value Ref Range Status   Specimen Description BLOOD RIGHT ARM   Final   Special Requests BOTTLES DRAWN AEROBIC ONLY 3CC   Final   Culture  Setup Time     Final   Value: 10/23/2013 04:42     Performed at [facility]   Culture     Final   Value:        BLOOD CULTURE RECEIVED NO GROWTH TO DATE CULTURE WILL BE HELD FOR 5 DAYS BEFORE ISSUING A FINAL NEGATIVE REPORT     Performed at [facility]   Report Status PENDING   Incomplete  CULTURE, BLOOD (ROUTINE X 2)     Status: None   Collection Time    10/22/13 10:34 PM   Result Value Ref Range Status   Specimen Description BLOOD RIGHT HAND   Final   Special Requests BOTTLES DRAWN AEROBIC ONLY 6CC   Final   Culture  Setup Time     Final   Value: 10/23/2013 04:42     Performed at [facility]   Culture     Final   Value:        BLOOD CULTURE RECEIVED NO GROWTH TO DATE CULTURE WILL BE HELD FOR 5 DAYS BEFORE ISSUING A FINAL NEGATIVE REPORT     Performed at [facility]   Report Status PENDING   Incomplete    Studies/Results: Dg Chest 2 View  10/23/2013   CLINICAL DATA:  Chest pain  EXAM: CHEST  2 VIEW  COMPARISON:  None.  FINDINGS: Left dialysis catheter removed. New right internal jugular tunneled dialysis catheter. Tip in the right atrium. No pneumothorax. Left basilar atelectasis improved. Lung volumes improved.  IMPRESSION: New right internal jugular dialysis catheter without pneumothorax. Tip is in the right atrium.  Improved left base atelectasis.   Electronically Signed   By: Maryclare Bean M.D.   On: 10/23/2013 08:05   Ct Chest W Contrast  10/23/2013   CLINICAL DATA:  MSSA bacteremia with endocarditis. New sternoclavicular tenderness and manubrial tenderness.  EXAM: CT CHEST WITH CONTRAST  TECHNIQUE: Multidetector CT imaging of the chest was performed during intravenous contrast administration.  CONTRAST:  9mL OMNIPAQUE IOHEXOL 300 MG/ML  SOLN  COMPARISON:  09/06/2013 abdominal CT  FINDINGS: THORACIC INLET/BODY WALL:  Right IJ dialysis catheter, tip at the upper right atrium. Thyroidectomy.  MEDIASTINUM:  Mild cardiomegaly. There is been previous aortic valve replacement and aortic root replacement. There is a new lobulated saccular outpouching arising near the right coronary cusp, which measures approximately 25 mm in diameter. The collection extends anteriorly and superiorly. The surrounding fat is newly infiltrated. There is no pericardial effusion. Coronary artery atherosclerosis. Subendocardial low-attenuation at the mildly thin left ventricular  apex suggest previous subendocardial infarction.  Mild mediastinal lymphadenopathy which is likely reactive in the setting of endocarditis.  42 mm diameter aorta just distal to the aortic graft. Diameter stable from 2013 MRI.  LUNG WINDOWS:  No consolidation or edema. Mild dependent atelectasis. Trace left pleural effusion which appears simple.  UPPER ABDOMEN:  Coarse calcification in the posterior segment right lobe liver, likely from previous granulomatous disease. No evidence of embolic phenomenon to the imaged upper abdominal viscera.  OSSEOUS:  No evidence of septic arthritis or osteomyelitis at the site of sternal and manubrial tenderness. Remote left-sided rib fractures.  Critical Value/emergent results were called by telephone at the time of interpretation on 10/23/2013 at 10:18 AM to Dr. Sloan Leiter, who verbally acknowledged these results.  IMPRESSION: 1. 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 Recommended gated cardiac CT or echocardiography. 2. No evidence of septic arthritis or osteomyelitis to explain sternoclavicular tenderness. 3. 42 mm diameter aorta just distal to the aortic graft. Diameter stable from 2013 MRI.   Electronically Signed   By: Jorje Guild M.D.   On: 10/23/2013 10:34    Assessment/Plan: 58 y.o. male with Past medical history of aortic valve replacement in 2008 and with aortic root correction in 2011, hypothyroidism, hypertension, depression.  Developed MSSA bacteremia and TTE done but did not show evidence of vegetation.  He though had findings of septic emboli to brain and treated for 6 weeks for PVE.  He did not tolerate gentamicin and treated with nafcillin with rifampin until discharge was changed to vancomycin with rifampin with dialysis.  He came in for follow up at the end of 6 weeks and not feeling well and sent in to hospital with CT scan suggesting large paravalvular abscess vs pseudoaneurysm.    1) ? Abscess - awaiting TEE.   On cefazolin for presumed MSSA.  Blood cultures remain negative.    Dr. Johnnye Sima will follow up over the weekend.    Scharlene Gloss, [place] for [facility] [place] [email or web address] [phone] pager   [phone] cell 10/24/2013, 2:42 PM

## 2013-10-24 NOTE — Clinical Documentation Improvement (Signed)
Possible Clinical Conditions? Severe Malnutrition   Protein Calorie Malnutrition Severe Protein Calorie Malnutrition Other Condition Cannot clinically determine  Supporting Information: Nutrition Assessment by Erlene Quan, RD at 10/23/2013 11:19 AM   Risk Factors:PMHx significant for aortic stenosis, chronic LBBB, thyroid CA post &thyroidectomy, ESRD (on HD), HTN, and depression. Admitted with septic arthritis and sternal infection/osteomyelitis seeding from his previous bacteremia.   Signs & Symptoms:"Pt with 7% wt loss since last admission. Pt's oral intake during previous hospitalization was poor, pt was consuming on average 50% of meals"  Diagnostics: (As per nutritional Assessment) "-Severe malnutrition in the context of chronic illness"  Treatment: Continue Ensure Complete PO BID - pt does not like Nepro or [employer].  Recommend renal vitamin.  RD to continue to follow nutrition care plan.   Thank You, Alessandra Grout, RN, BSN, CCDS, Clinical Documentation Specialist:  [phone]   Cell=[phone] [place] Information Management

## 2013-10-25 DIAGNOSIS — F3289 Other specified depressive episodes: Secondary | ICD-10-CM

## 2013-10-25 DIAGNOSIS — F329 Major depressive disorder, single episode, unspecified: Secondary | ICD-10-CM

## 2013-10-25 DIAGNOSIS — I5031 Acute diastolic (congestive) heart failure: Secondary | ICD-10-CM

## 2013-10-25 DIAGNOSIS — I214 Non-ST elevation (NSTEMI) myocardial infarction: Secondary | ICD-10-CM

## 2013-10-25 MED ORDER — HEPARIN SODIUM (PORCINE) 1000 UNIT/ML DIALYSIS: 20.0000 [IU]/kg | INTRAMUSCULAR | Status: DC | PRN

## 2013-10-25 MED ORDER — METOPROLOL TARTRATE 50 MG PO TABS
50.0000 mg | ORAL_TABLET | Freq: Once | ORAL | Status: AC
  Administered 2013-10-25: 50 mg via ORAL
  Filled 2013-10-25: qty 1

## 2013-10-25 MED ORDER — ZOLPIDEM TARTRATE 5 MG PO TABS
5.0000 mg | ORAL_TABLET | Freq: Every evening | ORAL | Status: DC | PRN
  Administered 2013-10-25 – 2013-10-27 (×2): 5 mg via ORAL
  Filled 2013-10-25 (×3): qty 1

## 2013-10-25 MED ORDER — LIDOCAINE HCL (PF) 1 % IJ SOLN
5.0000 mL | INTRAMUSCULAR | Status: DC | PRN
Start: 2013-10-25 — End: 2013-10-25

## 2013-10-25 MED ORDER — PENTAFLUOROPROP-TETRAFLUOROETH EX AERO: 1.0000 "application " | INHALATION_SPRAY | CUTANEOUS | Status: DC | PRN

## 2013-10-25 MED ORDER — LIDOCAINE-PRILOCAINE 2.5-2.5 % EX CREA: 1.0000 "application " | TOPICAL_CREAM | CUTANEOUS | Status: DC | PRN

## 2013-10-25 MED ORDER — SODIUM CHLORIDE 0.9 % IV SOLN
100.0000 mL | INTRAVENOUS | Status: DC | PRN
Start: 2013-10-25 — End: 2013-10-25

## 2013-10-25 MED ORDER — SODIUM CHLORIDE 0.9 % IV SOLN: 100.0000 mL | INTRAVENOUS | Status: DC | PRN

## 2013-10-25 MED ORDER — HEPARIN SODIUM (PORCINE) 1000 UNIT/ML DIALYSIS: 1000.0000 [IU] | INTRAMUSCULAR | Status: DC | PRN

## 2013-10-25 MED ORDER — NEPRO/CARBSTEADY PO LIQD
237.0000 mL | ORAL | Status: DC | PRN
Start: 2013-10-25 — End: 2013-10-25

## 2013-10-25 MED ORDER — CLONAZEPAM 1 MG PO TABS
1.0000 mg | ORAL_TABLET | Freq: Three times a day (TID) | ORAL | Status: DC | PRN
  Administered 2013-10-25 – 2013-10-27 (×3): 1 mg via ORAL
  Filled 2013-10-25 (×3): qty 1

## 2013-10-25 MED ORDER — ALTEPLASE 2 MG IJ SOLR: 2.0000 mg | Freq: Once | INTRAMUSCULAR | Status: DC | PRN

## 2013-10-25 MED ORDER — HEPARIN SODIUM (PORCINE) 1000 UNIT/ML DIALYSIS: 3000.0000 [IU] | INTRAMUSCULAR | Status: DC | PRN

## 2013-10-25 MED ORDER — ZOLPIDEM TARTRATE 5 MG PO TABS: 5.0000 mg | ORAL_TABLET | Freq: Every evening | ORAL | Status: DC | PRN

## 2013-10-25 MED ORDER — NEPRO/CARBSTEADY PO LIQD: 237.0000 mL | ORAL | Status: DC | PRN

## 2013-10-25 MED ORDER — LIDOCAINE HCL (PF) 1 % IJ SOLN: 5.0000 mL | INTRAMUSCULAR | Status: DC | PRN

## 2013-10-25 NOTE — Progress Notes (Signed)
Subjective: TEE done, pt stable no complaints  Filed Vitals:   10/24/13 1717 10/24/13 1742 10/24/13 2031 10/25/13 0439  BP: 145/86 130/82 113/73 122/73  Pulse: 85 91 98 89  Temp:  97.3 F (36.3 C) 97.9 F (36.6 C) 98.9 F (37.2 C)  TempSrc:  Oral Oral Oral  Resp: 24 20 20 20   Height:      Weight:      SpO2: 95% 96% 96% 95%   Exam: Alert, in no distress, calm No jvd Chest is clear bilat RRR 3/6 SEM no RG Abd soft, NTND, flat No LE edema  R IJ Diatek in place, L forearm AVF maturing and patent Neuro is fully oriented, nonfocal  Dialysis: TTS [facility] 4h    86kg    3K/2.25 [place]   R IJ Diatek (maturing LFA AVF 3/11) Heparin 8600 OP lab: 10.1/no tsat / 836    pth 175  Assessment: 1 Abnormal TEE / suspected paravalvular abcess vs pseudoaneurysm 2 ESRD on hemodialysis- had AKI last admission from [facility] / ATN vs immune complex; no signs of recovery, HD-dependent w AVF in place 3 Recent MSSA bacteremia w septic cerebral embolic and suspected endocarditis 4 Hx AoV replacement d/t congenital abnormality 5 Volume - stable, below dry wt 6 Anemia Hb 9 on darbe 100/wk 7 HTN/volume- no BP meds, BP low-normal, no extra vol  Plan- HD today    Kelly Splinter MD  pager [phone]    cell [phone]  10/25/2013, 8:59 AM     Recent Labs Lab 10/22/13 2226 10/23/13 1411  NA 135* 136*  K 3.7 3.7  CL 96 97  CO2 25 28  GLUCOSE 116* 114*  BUN 13 12  CREATININE 4.19* 3.65*  CALCIUM 8.7 8.4  PHOS  --  3.9    Recent Labs Lab 10/23/13 1411  ALBUMIN 1.8*    Recent Labs Lab 10/22/13 2226 10/23/13 1411  WBC 13.1* 3.3*  HGB 10.0* 9.0*  HCT 30.1* 27.6*  MCV 91.5 91.1  PLT 258 221   . aspirin EC  81 mg Oral QODAY  .  ceFAZolin (ANCEF) IV  2 g Intravenous Q M,W,F-HD  . darbepoetin (ARANESP) injection - DIALYSIS  100 mcg Intravenous Q Thu-HD  . feeding supplement (ENSURE COMPLETE)  237 mL Oral BID BM  . heparin  5,000 Units Subcutaneous 3 times per day  . levothyroxine  200  mcg Oral QAC breakfast  . multivitamin  1 tablet Oral QHS  . sertraline  50 mg Oral Daily  . sevelamer carbonate  800-1,600 mg Oral TID WC  . zolpidem  5 mg Oral Once     acetaminophen, acetaminophen, calcium carbonate, clonazePAM, ondansetron (ZOFRAN) IV, ondansetron

## 2013-10-25 NOTE — Progress Notes (Signed)
PATIENT DETAILS Name: Bradley [name] Age: 58 y.o. Sex: male Date of Birth: 1956/05/07 Admit Date: 10/22/2013 Admitting Physician Kelvin Cellar, MD [id number] W, MD  Brief summary: 58 year old man with hx of prosthetic aortic valve replacement in 2008 and with aortic root correction in 2011 who was admitted on February 10th with severe MSSA bacteremia and endocarditis with septic emboli to brain. He was initally broadly treated then appropriately narrowed to nafcillin, gentamicin and rifampin. His blood cultures cleared on 2/11/5. He unfortunately developed renal failure and gentamicin was held. He was treated with IV nafcillin and IV rifampin 300 mg IV q 8 hours thru end of February when rifampin changed to 600mg  daily.He was ultimately discharged on 10/10/13 with vancomycin and oral rifampin because he lost IV access besided HD site. HE has AV fistula created in left forearm for HD. His last dose of vanocomcyin on 3/24.  He was seen at the infectious disease clinic on 3/25 weight complained of pain in his chest with deep breathing, and on exam he was found to have tenderness and erythema on the left sternoclavicular joint area. He was then referred to the hospitalist service for further evaluation and treatment. 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  Subjective: No major issues overnight.Tearful and depressed this am  Assessment/Plan: Suspected large paravalvular abscess/pseudoaneurysm in the region of the right coronary cusp - Recent history of MSSA bacteremia, continue Ancef-day 4. Was already on antibiotics prior to this admission - blood cultures done on 3/25 negative so far. - Cardiology pending TEE on 3/27, cardiothoracic surgery and infectious disease consulted.  Suspected  prosthetic valve MSSA endocarditis with septic emboli - Please see above for further details. Patient was discharged on 3/13 with IV vancomycin and oral rifampin, he was treated for presumed prosthetic valve endocarditis with septic emboli to the brain. - Unfortunately despite continued IV antibiotics, it looks like he has developed further complications with a CT of the chest done on 3/26 showing a possible large paravalvular abscess/pseudoaneurysm in the region of the right coronary cusp.TEE on 3/27 confirmed a paravalvular abscess. Cardiothoracic surgery planning OR on 3/31  End-stage renal disease - Secondary to ATN from gentamicin or immune complex deposition from endocarditis. - Nephrology on board for hemodialysis.  Positive troponins - Likely secondary to paravalvular abscess/pseudoaneurysm rather than non-STEMI. - Cardiology following.  Recent history of toxic metabolic encephalopathy - From review of his prior admission records, his mental status seems to have improved significantly. Continue to monitor.  Hypothyroidism - Continue with levothyroxine  Anemia - Likely secondary to chronic kidney disease and acute illness. Continue to monitor.  Secondary hyperparathyroidism - Will defer to nephrology  Depression/Anxiety -prn Klonopin -c/w Zoloft -crying/depressed-will ask Psych to see and adjust meds  Disposition: Remain inpatient  DVT Prophylaxis: Prophylactic Heparin   Code Status: Full code   Family Communication None at bedside  Procedures:  None  CONSULTS:  cardiology, ID, nephrology and Cardiothoracic surgery, Psych   MEDICATIONS: Scheduled Meds: . aspirin EC  81 mg Oral QODAY  .  ceFAZolin (ANCEF) IV  2 g Intravenous Q M,W,F-HD  . darbepoetin (ARANESP) injection - DIALYSIS  100 mcg Intravenous Q Thu-HD  . feeding supplement (ENSURE COMPLETE)  237 mL Oral BID BM  . heparin  5,000  Units Subcutaneous 3 times per day  . levothyroxine  200 mcg Oral QAC  breakfast  . metoprolol tartrate  50 mg Oral Once  . multivitamin  1 tablet Oral QHS  . sertraline  50 mg Oral Daily  . sevelamer carbonate  800-1,600 mg Oral TID WC  . zolpidem  5 mg Oral Once   Continuous Infusions:  PRN Meds:.acetaminophen, acetaminophen, calcium carbonate, clonazePAM, ondansetron (ZOFRAN) IV, ondansetron, zolpidem, zolpidem  Antibiotics: Anti-infectives   Start     Dose/Rate Route Frequency Ordered Stop   10/23/13 0800  ceFAZolin (ANCEF) IVPB 1 g/50 mL premix  Status:  Discontinued     1 g 100 mL/hr over 30 Minutes Intravenous  Once 10/22/13 2313 10/22/13 2326   10/22/13 2359  ceFAZolin (ANCEF) IVPB 2 g/50 mL premix     2 g 100 mL/hr over 30 Minutes Intravenous Every M-W-F (Hemodialysis) 10/22/13 2314     10/22/13 2315  ceFAZolin (ANCEF) IVPB 2 g/50 mL premix  Status:  Discontinued     2 g 100 mL/hr over 30 Minutes Intravenous  Once 10/22/13 2313 10/22/13 2326       PHYSICAL EXAM: Vital signs in last 24 hours: Filed Vitals:   10/24/13 1717 10/24/13 1742 10/24/13 2031 10/25/13 0439  BP: 145/86 130/82 113/73 122/73  Pulse: 85 91 98 89  Temp:  97.3 F (36.3 C) 97.9 F (36.6 C) 98.9 F (37.2 C)  TempSrc:  Oral Oral Oral  Resp: 24 20 20 20   Height:      Weight:      SpO2: 95% 96% 96% 95%    Weight change: -1.738 kg (-3 lb 13.3 oz) Filed Weights   10/23/13 1350 10/23/13 1803 10/24/13 1700  Weight: 85.2 kg (187 lb 13.3 oz) 83.6 kg (184 lb 4.9 oz) 83.462 kg (184 lb)   Body mass index is 23.94 kg/(m^2).   Gen Exam: Awake and alert with clear speech.   Neck: Supple, No JVD.   Chest: B/L Clear.   CVS: S1 S2 Regular, systolic murmur Abdomen: soft, BS +, non tender, non distended.  Extremities: no edema, lower extremities warm to touch. Neurologic: Non Focal.   Skin: No Rash.   Wounds: N/A.   Intake/Output from previous day:  Intake/Output Summary (Last 24 hours) at 10/25/13 1138 Last data filed at 10/25/13 1054  Gross per 24 hour  Intake     592 ml  Output      0 ml  Net    592 ml     LAB RESULTS: CBC  Recent Labs Lab 10/22/13 2226 10/23/13 1411  WBC 13.1* 3.3*  HGB 10.0* 9.0*  HCT 30.1* 27.6*  PLT 258 221  MCV 91.5 91.1  MCH 30.4 29.7  MCHC 33.2 32.6  RDW 19.4* 19.2*    Chemistries   Recent Labs Lab 10/22/13 2226 10/23/13 1411  NA 135* 136*  K 3.7 3.7  CL 96 97  CO2 25 28  GLUCOSE 116* 114*  BUN 13 12  CREATININE 4.19* 3.65*  CALCIUM 8.7 8.4    CBG: No results found for this basename: GLUCAP,  in the last 168 hours  GFR Estimated Creatinine Clearance: 25.9 ml/min (by C-G formula based on Cr of 3.65).  Coagulation profile No results found for this basename: INR, PROTIME,  in the last 168 hours  Cardiac Enzymes  Recent Labs Lab 10/23/13 0254 10/23/13 0802 10/23/13 1411  TROPONINI 0.67* 0.60* 0.71*    No components found with this basename: POCBNP,  No results  found for this basename: DDIMER,  in the last 72 hours No results found for this basename: HGBA1C,  in the last 72 hours No results found for this basename: CHOL, HDL, LDLCALC, TRIG, CHOLHDL, LDLDIRECT,  in the last 72 hours No results found for this basename: TSH, T4TOTAL, FREET3, T3FREE, THYROIDAB,  in the last 72 hours No results found for this basename: VITAMINB12, FOLATE, FERRITIN, TIBC, IRON, RETICCTPCT,  in the last 72 hours No results found for this basename: LIPASE, AMYLASE,  in the last 72 hours  Urine Studies No results found for this basename: UACOL, UAPR, USPG, UPH, UTP, UGL, UKET, UBIL, UHGB, UNIT, UROB, ULEU, UEPI, UWBC, URBC, UBAC, CAST, CRYS, UCOM, BILUA,  in the last 72 hours  MICROBIOLOGY: Recent Results (from the past 240 hour(s))  CLOSTRIDIUM DIFFICILE BY PCR     Status: None   Collection Time    10/22/13 10:04 PM      Result Value Ref Range Status   C difficile by pcr NEGATIVE  NEGATIVE Final  CULTURE, BLOOD (ROUTINE X 2)     Status: None   Collection Time    10/22/13 10:26 PM      Result Value Ref  Range Status   Specimen Description BLOOD RIGHT ARM   Final   Special Requests BOTTLES DRAWN AEROBIC ONLY 3CC   Final   Culture  Setup Time     Final   Value: 10/23/2013 04:42     Performed at [facility]   Culture     Final   Value:        BLOOD CULTURE RECEIVED NO GROWTH TO DATE CULTURE WILL BE HELD FOR 5 DAYS BEFORE ISSUING A FINAL NEGATIVE REPORT     Performed at [facility]   Report Status PENDING   Incomplete  CULTURE, BLOOD (ROUTINE X 2)     Status: None   Collection Time    10/22/13 10:34 PM      Result Value Ref Range Status   Specimen Description BLOOD RIGHT HAND   Final   Special Requests BOTTLES DRAWN AEROBIC ONLY 6CC   Final   Culture  Setup Time     Final   Value: 10/23/2013 04:42     Performed at [facility]   Culture     Final   Value:        BLOOD CULTURE RECEIVED NO GROWTH TO DATE CULTURE WILL BE HELD FOR 5 DAYS BEFORE ISSUING A FINAL NEGATIVE REPORT     Performed at [facility]   Report Status PENDING   Incomplete    RADIOLOGY STUDIES/RESULTS: Dg Chest 2 View  10/23/2013   CLINICAL DATA:  Chest pain  EXAM: CHEST  2 VIEW  COMPARISON:  None.  FINDINGS: Left dialysis catheter removed. New right internal jugular tunneled dialysis catheter. Tip in the right atrium. No pneumothorax. Left basilar atelectasis improved. Lung volumes improved.  IMPRESSION: New right internal jugular dialysis catheter without pneumothorax. Tip is in the right atrium.  Improved left base atelectasis.   Electronically Signed   By: Maryclare Bean M.D.   On: 10/23/2013 08:05   Ct Chest W Contrast  10/23/2013   CLINICAL DATA:  MSSA bacteremia with endocarditis. New sternoclavicular tenderness and manubrial tenderness.  EXAM: CT CHEST WITH CONTRAST  TECHNIQUE: Multidetector CT imaging of the chest was performed during intravenous contrast administration.  CONTRAST:  74mL OMNIPAQUE IOHEXOL 300 MG/ML  SOLN  COMPARISON:  09/06/2013 abdominal CT  FINDINGS: THORACIC  INLET/BODY WALL:  Right IJ dialysis catheter, tip at the upper right atrium. Thyroidectomy.  MEDIASTINUM:  Mild cardiomegaly. There is been previous aortic valve replacement and aortic root replacement. There is a new lobulated saccular outpouching arising near the right coronary cusp, which measures approximately 25 mm in diameter. The collection extends anteriorly and superiorly. The surrounding fat is newly infiltrated. There is no pericardial effusion. Coronary artery atherosclerosis. Subendocardial low-attenuation at the mildly thin left ventricular apex suggest previous subendocardial infarction.  Mild mediastinal lymphadenopathy which is likely reactive in the setting of endocarditis.  42 mm diameter aorta just distal to the aortic graft. Diameter stable from 2013 MRI.  LUNG WINDOWS:  No consolidation or edema. Mild dependent atelectasis. Trace left pleural effusion which appears simple.  UPPER ABDOMEN:  Coarse calcification in the posterior segment right lobe liver, likely from previous granulomatous disease. No evidence of embolic phenomenon to the imaged upper abdominal viscera.  OSSEOUS:  No evidence of septic arthritis or osteomyelitis at the site of sternal and manubrial tenderness. Remote left-sided rib fractures.  Critical Value/emergent results were called by telephone at the time of interpretation on 10/23/2013 at 10:18 AM to Dr. Sloan Leiter, who verbally acknowledged these results.  IMPRESSION: 1. 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 Recommended gated cardiac CT or echocardiography. 2. No evidence of septic arthritis or osteomyelitis to explain sternoclavicular tenderness. 3. 42 mm diameter aorta just distal to the aortic graft. Diameter stable from 2013 MRI.   Electronically Signed   By: Jorje Guild M.D.   On: 10/23/2013 10:34   Ir Fluoro Guide Cv Line Right  09/25/2013   INDICATION: Acute renal failure. Temporary catheter needs conversion to a  tunneled dialysis catheter.  EXAM: CONVERSION OF NON TUNNELED CATHETER TO A TUNNELED DIALYSIS CATHETER WITH FLUOROSCOPIC GUIDANCE  Physician: Stephan Minister. Anselm Pancoast, MD  MEDICATIONS: 3 mg versed, 150 mcg fentanyl. The patient was already on scheduled antibiotics. A radiology nurse monitored the patient for moderate sedation.  ANESTHESIA/SEDATION: Moderate sedation time: 25 minutes  FLUOROSCOPY TIME:  24 seconds  PROCEDURE: Informed consent was obtained for conversion to a tunneled dialysis catheter. The existing right jugular catheter and right side of the neck was prepped and draped in sterile fashion. Maximal barrier sterile technique was utilized including caps, mask, sterile gowns, sterile gloves, sterile drape, hand hygiene and skin antiseptic. The skin below the right clavicle was anesthetized with 1% lidocaine. A subcutaneous tract was created and a 23 cm tip to cuff HemoSplit catheter was placed through the tract to the temporary dialysis catheter site. The temporary dialysis catheter was exchanged for a peel-away sheath over a wire. The new dialysis catheter was placed through the peel-away sheath and the tip was placed in the right atrium. Both lumens aspirated and flushed well. Appropriate amount of heparin was placed in both lumens. Gelfoam was placed within the subcutaneous tract. The vein skin site and catheter exit site were sutured with 2-0 Ethilon sutures.  COMPLICATIONS: None  FINDINGS: Catheter tip in the right atrium.  IMPRESSION: Successful conversion of a temporary dialysis catheter to a tunneled dialysis catheter with fluoroscopy.   Electronically Signed   By: Markus Daft M.D.   On: 09/25/2013 16:25    Oren Binet, MD  [facility] Pager:[phone]  If 7PM-7AM, please contact night-coverage [email or web address] Password [facility] 10/25/2013, 11:38 AM   LOS: 3 days

## 2013-10-25 NOTE — Progress Notes (Signed)
Patient Name: Bradley [name] Date of Encounter: 10/25/2013  Active Problems:   Prosthetic valve endocarditis   Chronic combined systolic and diastolic CHF (congestive heart failure)   Length of Stay: 3  SUBJECTIVE  The patient feels very tires and anxious, he denies CP or SOB but complains of insomnia. He is able to sleep in horizontal position without SOB.   CURRENT MEDS . aspirin EC  81 mg Oral QODAY  .  ceFAZolin (ANCEF) IV  2 g Intravenous Q M,W,F-HD  . darbepoetin (ARANESP) injection - DIALYSIS  100 mcg Intravenous Q Thu-HD  . feeding supplement (ENSURE COMPLETE)  237 mL Oral BID BM  . heparin  5,000 Units Subcutaneous 3 times per day  . levothyroxine  200 mcg Oral QAC breakfast  . metoprolol tartrate  50 mg Oral Once  . multivitamin  1 tablet Oral QHS  . sertraline  50 mg Oral Daily  . sevelamer carbonate  800-1,600 mg Oral TID WC  . zolpidem  5 mg Oral Once   OBJECTIVE  Filed Vitals:   10/24/13 1717 10/24/13 1742 10/24/13 2031 10/25/13 0439  BP: 145/86 130/82 113/73 122/73  Pulse: 85 91 98 89  Temp:  97.3 F (36.3 C) 97.9 F (36.6 C) 98.9 F (37.2 C)  TempSrc:  Oral Oral Oral  Resp: 24 20 20 20   Height:      Weight:      SpO2: 95% 96% 96% 95%    Intake/Output Summary (Last 24 hours) at 10/25/13 1053 Last data filed at 10/24/13 2033  Gross per 24 hour  Intake    372 ml  Output      0 ml  Net    372 ml   Filed Weights   10/23/13 1350 10/23/13 1803 10/24/13 1700  Weight: 187 lb 13.3 oz (85.2 kg) 184 lb 4.9 oz (83.6 kg) 184 lb (83.462 kg)   PHYSICAL EXAM  General: Pleasant, NAD. Neuro: Alert and oriented X 3. Moves all extremities spontaneously. Psych: Normal affect. HEENT:  Normal  Neck: Supple without bruits or JVD. Lungs:  Resp regular and unlabored, CTA. Heart: RRR no s3, s4, 2/6 systolic murmur. Abdomen: Soft, non-tender, non-distended, BS + x 4.  Extremities: No clubbing, cyanosis or edema. DP/PT/Radials 2+ and equal  bilaterally.  Accessory Clinical Findings  CBC  Recent Labs  10/22/13 2226 10/23/13 1411  WBC 13.1* 3.3*  HGB 10.0* 9.0*  HCT 30.1* 27.6*  MCV 91.5 91.1  PLT 258 [id number]   Basic Metabolic Panel  Recent Labs  10/22/13 2226 10/23/13 1411  NA 135* 136*  K 3.7 3.7  CL 96 97  CO2 25 28  GLUCOSE 116* 114*  BUN 13 12  CREATININE 4.19* 3.65*  CALCIUM 8.7 8.4  PHOS  --  3.9   Cardiac Enzymes  Recent Labs  10/23/13 0254 10/23/13 0802 10/23/13 1411  TROPONINI 0.67* 0.60* 0.71*   Radiology/Studies  Dg Chest 2 View  10/23/2013   CLINICAL DATA:  Chest pain  EXAM: CHEST  2 VIEW  COMPARISON:  None.  FINDINGS: Left dialysis catheter removed. New right internal jugular tunneled dialysis catheter. Tip in the right atrium. No pneumothorax. Left basilar atelectasis improved. Lung volumes improved.  IMPRESSION: New right internal jugular dialysis catheter without pneumothorax. Tip is in the right atrium.  Improved left base atelectasis.   Electronically Signed   By: Maryclare Bean M.D.   On: 10/23/2013 08:05    IMPRESSION: 1. Large paravalvular abscess/pseudoaneurysm in the region of the right  coronary cusp. Recommended gated cardiac CT or echocardiography. 2. No evidence of septic arthritis or osteomyelitis to explain sternoclavicular tenderness. 3. 42 mm diameter aorta just distal to the aortic graft. Diameter stable from 2013 MRI.   Electronically Signed   By: Jorje Guild M.D.   On: 10/23/2013 10:34   ECG: 10/22/13 SR, iLBBB, LVH, unchanged from priors on 09/22/13    ASSESSMENT AND PLAN  58 year old male with h/o AVR and aortic root replacement (Bentall procedure) in 2011   1. MSSA endocarditis with paravalvular abscess/pseudoaneurysm surroung approximately half of the valvular circumference, based on TEE measures at least 14 x 52 mm, a gated chest CT with further characterization of the abscess and coronary vessels is planned for today.   2. ESRD on HD - I discussed the case with the  nephrologist and we agreed that the patient needs CT/cath to characterize abscess and coronaries prior to the surgery, CT preferred as it gives more info regarding abscess   3. CHF - chronic combined systolic and diastolic, the patient seems euvolemic right now  4. Troponin elevation - TnI 0.7 in ESRD, possibly secondary to coronary origin narrowing with the abscess expansion - pushing the Bentall graft inward in systole   Signed, Dorothy Spark MD, [facility] 10/25/2013

## 2013-10-25 NOTE — Progress Notes (Signed)
Patient ID: Bradley [name], male   DOB: 12/19/1955, 58 y.o.   MRN: [id number]      [address]       [place], [address]             [phone]                 1 Day Post-Op Procedure(s) (LRB): TRANSESOPHAGEAL ECHOCARDIOGRAM (TEE) (N/A)  LOS: 3 days   Subjective: Patient very emotional and tearful at times  Objective: Vital signs in last 24 hours: Patient Vitals for the past 24 hrs:  BP Temp Temp src Pulse Resp SpO2 Height Weight  10/25/13 0439 122/73 mmHg 98.9 F (37.2 C) Oral 89 20 95 % - -  10/24/13 2031 113/73 mmHg 97.9 F (36.6 C) Oral 98 20 96 % - -  10/24/13 1742 130/82 mmHg 97.3 F (36.3 C) Oral 91 20 96 % - -  10/24/13 1717 145/86 mmHg - - 85 24 95 % - -  10/24/13 1704 153/81 mmHg 98 F (36.7 C) Oral 86 17 97 % - -  10/24/13 1700 138/91 mmHg - - 84 23 100 % 6' 1.5" (1.867 m) 184 lb (83.462 kg)  10/24/13 1650 135/74 mmHg - - 85 18 99 % - -  10/24/13 1645 137/76 mmHg - - 82 22 99 % - -  10/24/13 1640 140/73 mmHg - - 81 22 99 % - -  10/24/13 1635 137/69 mmHg - - 91 20 100 % - -  10/24/13 1630 139/71 mmHg - - 81 24 95 % - -  10/24/13 1625 138/83 mmHg - - 82 21 100 % - -  10/24/13 1620 128/81 mmHg - - 85 20 100 % - -  10/24/13 1539 142/87 mmHg 98.1 F (36.7 C) Oral 85 - 97 % - -  10/24/13 1402 122/71 mmHg 98.7 F (37.1 C) Oral 84 20 96 % - -    Filed Weights   10/23/13 1350 10/23/13 1803 10/24/13 1700  Weight: 187 lb 13.3 oz (85.2 kg) 184 lb 4.9 oz (83.6 kg) 184 lb (83.462 kg)    Hemodynamic parameters for last 24 hours:    Intake/Output from previous day: 03/27 0701 - 03/28 0700 In: 372 [P.O.:222; IV Piggyback:150] Out: -  Intake/Output this shift:    Scheduled Meds: . aspirin EC  81 mg Oral QODAY  .  ceFAZolin (ANCEF) IV  2 g Intravenous Q M,W,F-HD  . darbepoetin (ARANESP) injection - DIALYSIS  100 mcg Intravenous Q Thu-HD  . feeding supplement (ENSURE COMPLETE)  237 mL Oral BID BM  . heparin  5,000 Units Subcutaneous 3 times per  day  . levothyroxine  200 mcg Oral QAC breakfast  . multivitamin  1 tablet Oral QHS  . sertraline  50 mg Oral Daily  . sevelamer carbonate  800-1,600 mg Oral TID WC  . zolpidem  5 mg Oral Once   Continuous Infusions:  PRN Meds:.acetaminophen, acetaminophen, calcium carbonate, clonazePAM, ondansetron (ZOFRAN) IV, ondansetron    Lab Results: CBC: Recent Labs  10/22/13 2226 10/23/13 1411  WBC 13.1* 3.3*  HGB 10.0* 9.0*  HCT 30.1* 27.6*  PLT 258 221   BMET:  Recent Labs  10/22/13 2226 10/23/13 1411  NA 135* 136*  K 3.7 3.7  CL 96 97  CO2 25 28  GLUCOSE 116* 114*  BUN 13 12  CREATININE 4.19* 3.65*  CALCIUM 8.7 8.4    PT/INR: No results found for this basename: LABPROT, INR,  in the  last 72 hours   Radiology Ct Chest W Contrast  10/23/2013   CLINICAL DATA:  MSSA bacteremia with endocarditis. New sternoclavicular tenderness and manubrial tenderness.  EXAM: CT CHEST WITH CONTRAST  TECHNIQUE: Multidetector CT imaging of the chest was performed during intravenous contrast administration.  CONTRAST:  29mL OMNIPAQUE IOHEXOL 300 MG/ML  SOLN  COMPARISON:  09/06/2013 abdominal CT  FINDINGS: THORACIC INLET/BODY WALL:  Right IJ dialysis catheter, tip at the upper right atrium. Thyroidectomy.  MEDIASTINUM:  Mild cardiomegaly. There is been previous aortic valve replacement and aortic root replacement. There is a new lobulated saccular outpouching arising near the right coronary cusp, which measures approximately 25 mm in diameter. The collection extends anteriorly and superiorly. The surrounding fat is newly infiltrated. There is no pericardial effusion. Coronary artery atherosclerosis. Subendocardial low-attenuation at the mildly thin left ventricular apex suggest previous subendocardial infarction.  Mild mediastinal lymphadenopathy which is likely reactive in the setting of endocarditis.  42 mm diameter aorta just distal to the aortic graft. Diameter stable from 2013 MRI.  LUNG WINDOWS:  No  consolidation or edema. Mild dependent atelectasis. Trace left pleural effusion which appears simple.  UPPER ABDOMEN:  Coarse calcification in the posterior segment right lobe liver, likely from previous granulomatous disease. No evidence of embolic phenomenon to the imaged upper abdominal viscera.  OSSEOUS:  No evidence of septic arthritis or osteomyelitis at the site of sternal and manubrial tenderness. Remote left-sided rib fractures.  Critical Value/emergent results were called by telephone at the time of interpretation on 10/23/2013 at 10:18 AM to Dr. Sloan Leiter, who verbally acknowledged these results.  IMPRESSION: 1. 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 Recommended gated cardiac CT or echocardiography. 2. No evidence of septic arthritis or osteomyelitis to explain sternoclavicular tenderness. 3. 42 mm diameter aorta just distal to the aortic graft. Diameter stable from 2013 MRI.   Electronically Signed   By: Jorje Guild M.D.   On: 10/23/2013 10:34   TEE reviewed with Dr Meda Coffee  Aortic Valve: Sp bioprosthetic valve and aortic root replacement (Bentall procedure), there is an echolucid cavity around the non-coronary and right coronary cusp consistent with paravalvular abscess measuring 14 x 52 mm. The inflow is most probably at the level of right coronary cusp or slightly bellow    Assessment/Plan: S/P Procedure(s) (LRB): TRANSESOPHAGEAL ECHOCARDIOGRAM (TEE) (N/A) Discussed finding of TEE with patient and family.  Needs cardiac dedicated CT to evaluate anatomy fully and have some evaluation of coronary a. Dr Meda Coffee is on call for cardiology Sat and will monitor study so proper films are obtained Likely will need third time redo bentall, with obvious high risk due to endocarditis, redo status and now in chronic renal failure Poss Tuesday   [name] MD 10/25/2013 9:08 AM

## 2013-10-25 NOTE — Progress Notes (Signed)
Pt restless, tearful, and anxious. Klonopin given. Pt states that he feels confused and he's been feeling this way at night. Pt alert and orientedx4. RN provided emotional support and ambulated pt with use of walker to nurses station and back to patient room. Pt reports feeling better after walk. Will continue to monitor pt.

## 2013-10-25 NOTE — Progress Notes (Signed)
INFECTIOUS DISEASE PROGRESS NOTE  ID: Bradley [name] is a 58 y.o. male with  Active Problems:   Prosthetic valve endocarditis   Chronic combined systolic and diastolic CHF (congestive heart failure)  Subjective: Still some CP  Abtx:  Anti-infectives   Start     Dose/Rate Route Frequency Ordered Stop   10/23/13 0800  ceFAZolin (ANCEF) IVPB 1 g/50 mL premix  Status:  Discontinued     1 g 100 mL/hr over 30 Minutes Intravenous  Once 10/22/13 2313 10/22/13 2326   10/22/13 2359  ceFAZolin (ANCEF) IVPB 2 g/50 mL premix     2 g 100 mL/hr over 30 Minutes Intravenous Every M-W-F (Hemodialysis) 10/22/13 2314     10/22/13 2315  ceFAZolin (ANCEF) IVPB 2 g/50 mL premix  Status:  Discontinued     2 g 100 mL/hr over 30 Minutes Intravenous  Once 10/22/13 2313 10/22/13 2326      Medications:  Scheduled: . aspirin EC  81 mg Oral QODAY  .  ceFAZolin (ANCEF) IV  2 g Intravenous Q M,W,F-HD  . darbepoetin (ARANESP) injection - DIALYSIS  100 mcg Intravenous Q Thu-HD  . feeding supplement (ENSURE COMPLETE)  237 mL Oral BID BM  . heparin  5,000 Units Subcutaneous 3 times per day  . levothyroxine  200 mcg Oral QAC breakfast  . multivitamin  1 tablet Oral QHS  . sertraline  50 mg Oral Daily  . sevelamer carbonate  800-1,600 mg Oral TID WC  . zolpidem  5 mg Oral Once    Objective: Vital signs in last 24 hours: Temp:  [97.3 F (36.3 C)-98.9 F (37.2 C)] 98.9 F (37.2 C) (03/28 0439) Pulse Rate:  [81-98] 96 (03/28 1143) Resp:  [17-24] 20 (03/28 0439) BP: (113-153)/(69-91) 128/80 mmHg (03/28 1143) SpO2:  [95 %-100 %] 95 % (03/28 0439) Weight:  [83.462 kg (184 lb)] 83.462 kg (184 lb) (03/27 1700)   General appearance: alert, cooperative and no distress Resp: clear to auscultation bilaterally Chest wall: R sided HD catheter is clean.  Cardio: RRR GI: normal findings: bowel sounds normal and soft, non-tender Extremities: edema none. calluses on feet.  and LUE fistula +bruit  Lab  Results  Recent Labs  10/22/13 2226 10/23/13 1411  WBC 13.1* 3.3*  HGB 10.0* 9.0*  HCT 30.1* 27.6*  NA 135* 136*  K 3.7 3.7  CL 96 97  CO2 25 28  BUN 13 12  CREATININE 4.19* 3.65*   Liver Panel  Recent Labs  10/23/13 1411  ALBUMIN 1.8*   Sedimentation Rate  Recent Labs  10/22/13 2226  ESRSEDRATE 55*   C-Reactive Protein  Recent Labs  10/22/13 2226  CRP 6.9*    Microbiology: Recent Results (from the past 240 hour(s))  CLOSTRIDIUM DIFFICILE BY PCR     Status: None   Collection Time    10/22/13 10:04 PM      Result Value Ref Range Status   C difficile by pcr NEGATIVE  NEGATIVE Final  CULTURE, BLOOD (ROUTINE X 2)     Status: None   Collection Time    10/22/13 10:26 PM      Result Value Ref Range Status   Specimen Description BLOOD RIGHT ARM   Final   Special Requests BOTTLES DRAWN AEROBIC ONLY 3CC   Final   Culture  Setup Time     Final   Value: 10/23/2013 04:42     Performed at [facility]     Final  Value:        BLOOD CULTURE RECEIVED NO GROWTH TO DATE CULTURE WILL BE HELD FOR 5 DAYS BEFORE ISSUING A FINAL NEGATIVE REPORT     Performed at [facility]   Report Status PENDING   Incomplete  CULTURE, BLOOD (ROUTINE X 2)     Status: None   Collection Time    10/22/13 10:34 PM      Result Value Ref Range Status   Specimen Description BLOOD RIGHT HAND   Final   Special Requests BOTTLES DRAWN AEROBIC ONLY [facility]   Final   Culture  Setup Time     Final   Value: 10/23/2013 04:42     Performed at [facility]   Culture     Final   Value:        BLOOD CULTURE RECEIVED NO GROWTH TO DATE CULTURE WILL BE HELD FOR 5 DAYS BEFORE ISSUING A FINAL NEGATIVE REPORT     Performed at [facility]   Report Status PENDING   Incomplete    Studies/Results: No results found.   Assessment/Plan: Paravalvular abscess on TEE MSSA bacteremia ESRD  Total days of antibiotics: 47 (ancef)   For cardiac CT Possible re-do cardiac  surgery Continue ancef, appreciate pharm assistance with dosing.         Bobby Rumpf Infectious Diseases (pager) [phone] [email or web address] 10/25/2013, 11:50 AM  LOS: 3 days

## 2013-10-26 ENCOUNTER — Inpatient Hospital Stay (HOSPITAL_COMMUNITY): Payer: Medicaid Other

## 2013-10-26 DIAGNOSIS — F333 Major depressive disorder, recurrent, severe with psychotic symptoms: Secondary | ICD-10-CM

## 2013-10-26 DIAGNOSIS — I359 Nonrheumatic aortic valve disorder, unspecified: Secondary | ICD-10-CM

## 2013-10-26 LAB — RENAL FUNCTION PANEL
Albumin: 2 g/dL — ABNORMAL LOW (ref 3.5–5.2)
BUN: 5 mg/dL — ABNORMAL LOW (ref 6–23)
CO2: 27 mEq/L (ref 19–32)
Calcium: 8.4 mg/dL (ref 8.4–10.5)
Chloride: 97 mEq/L (ref 96–112)
Creatinine, Ser: 2.42 mg/dL — ABNORMAL HIGH (ref 0.50–1.35)
GFR calc Af Amer: 33 mL/min — ABNORMAL LOW (ref >90)
GFR calc non Af Amer: 28 mL/min — ABNORMAL LOW (ref >90)
Glucose, Bld: 96 mg/dL (ref 70–99)
Phosphorus: 2.6 mg/dL (ref 2.3–4.6)
Potassium: 4.5 mEq/L (ref 3.7–5.3)
Sodium: 136 mEq/L — ABNORMAL LOW (ref 137–147)

## 2013-10-26 LAB — CBC
HCT: 30.9 % — ABNORMAL LOW (ref 39.0–52.0)
Hemoglobin: 10 g/dL — ABNORMAL LOW (ref 13.0–17.0)
MCH: 29.9 pg (ref 26.0–34.0)
MCHC: 32.4 g/dL (ref 30.0–36.0)
MCV: 92.2 fL (ref 78.0–100.0)
Platelets: 249 10*3/uL (ref 150–400)
RBC: 3.35 MIL/uL — ABNORMAL LOW (ref 4.22–5.81)
RDW: 19.4 % — ABNORMAL HIGH (ref 11.5–15.5)
WBC: 13.8 10*3/uL — ABNORMAL HIGH (ref 4.0–10.5)

## 2013-10-26 MED ORDER — METOPROLOL TARTRATE 1 MG/ML IV SOLN
2.5000 mg | Freq: Once | INTRAVENOUS | Status: AC
  Administered 2013-10-26: 2.5 mg via INTRAVENOUS

## 2013-10-26 MED ORDER — METOPROLOL TARTRATE 1 MG/ML IV SOLN
5.0000 mg | Freq: Once | INTRAVENOUS | Status: AC
  Administered 2013-10-26: 5 mg via INTRAVENOUS

## 2013-10-26 MED ORDER — METOPROLOL TARTRATE 1 MG/ML IV SOLN
INTRAVENOUS | Status: AC
  Filled 2013-10-26: qty 5

## 2013-10-26 MED ORDER — NITROGLYCERIN 0.4 MG SL SUBL
0.4000 mg | SUBLINGUAL_TABLET | SUBLINGUAL | Status: DC | PRN
  Administered 2013-10-26: 0.4 mg via SUBLINGUAL

## 2013-10-26 MED ORDER — METOPROLOL TARTRATE 50 MG PO TABS
50.0000 mg | ORAL_TABLET | Freq: Once | ORAL | Status: AC
  Administered 2013-10-26: 50 mg via ORAL
  Filled 2013-10-26: qty 1

## 2013-10-26 MED ORDER — IOHEXOL 350 MG/ML SOLN: 80.0000 mL | Freq: Once | INTRAVENOUS | Status: AC | PRN

## 2013-10-26 MED ORDER — SODIUM CHLORIDE 0.9 % IJ SOLN
3.0000 mL | Freq: Two times a day (BID) | INTRAMUSCULAR | Status: DC
  Administered 2013-10-26 – 2013-10-28 (×3): 3 mL via INTRAVENOUS

## 2013-10-26 MED ORDER — INFLUENZA VAC SPLIT QUAD 0.5 ML IM SUSP
0.5000 mL | Freq: Once | INTRAMUSCULAR | Status: DC
  Filled 2013-10-26: qty 0.5

## 2013-10-26 MED ORDER — NITROGLYCERIN 0.4 MG SL SUBL
SUBLINGUAL_TABLET | SUBLINGUAL | Status: AC
  Filled 2013-10-26: qty 1

## 2013-10-26 MED ORDER — SODIUM CHLORIDE 0.9 % IV SOLN
INTRAVENOUS | Status: AC
  Filled 2013-10-26: qty 50

## 2013-10-26 NOTE — Progress Notes (Signed)
Dr. Meda Coffee telephone order to give 5mg  of IV Metoprolol and to hold if BP systolic less than [id number] and HR less than 60bmp.

## 2013-10-26 NOTE — Progress Notes (Signed)
Subjective: No complaints, had HD late last night, "I just hope I'm ok"  Filed Vitals:   10/25/13 2050 10/25/13 2209 10/26/13 0602 10/26/13 1109  BP: 126/83 120/70 116/76 122/83  Pulse: 89 81 81 90  Temp:  97.7 F (36.5 C) 98.5 F (36.9 C)   TempSrc:  Oral Oral   Resp: 15 16 16    Height:      Weight:      SpO2:  98% 97%    Exam: Alert, in no distress, calm No jvd Chest is clear bilat RRR 3/6 SEM no RG Abd soft, NTND, flat No LE edema  R IJ Diatek in place, L forearm AVF maturing and patent Neuro is fully oriented, nonfocal  Dialysis: TTS [facility] 4h    86kg    3K/2.25 Bath   R IJ Diatek   Heparin 8600  (maturing LFA AVF 3/11) OP lab: 10.1/no tsat / 19    pth 175  Assessment: 1 Abnormal TEE / suspected paravalvular abcess vs pseudoaneurysm 2 ESRD on hemodialysis- had AKI last admission from [facility] / ATN vs immune complex; no signs of recovery, HD-dependent w AVF in place. Remains oliguric, makes "1/2 cup a day" of urine 3 Recent MSSA bacteremia w septic cerebral emboli / suspected endocarditis 4 Hx AoV replacement d/t congenital abnormality 5 Volume - stable, below dry wt 6 Anemia Hb 10 on darbe 100/wk 7 HTN/volume- no BP meds, BP low-normal, no extra vol  Plan- OK to give contrast if needed given the situation, the risk to his kidneys while already in state of full renal failure is theoretical. Continue HD TTS.      Kelly Splinter MD  pager [phone]    cell [phone]  10/26/2013, 11:22 AM     Recent Labs Lab 10/22/13 2226 10/23/13 1411 10/26/13 0626  NA 135* 136* 136*  K 3.7 3.7 4.5  CL 96 97 97  CO2 25 28 27   GLUCOSE 116* 114* 96  BUN 13 12 5*  CREATININE 4.19* 3.65* 2.42*  CALCIUM 8.7 8.4 8.4  PHOS  --  3.9 2.6    Recent Labs Lab 10/23/13 1411 10/26/13 0626  ALBUMIN 1.8* 2.0*    Recent Labs Lab 10/22/13 2226 10/23/13 1411 10/26/13 0626  WBC 13.1* 3.3* 13.8*  HGB 10.0* 9.0* 10.0*  HCT 30.1* 27.6* 30.9*  MCV 91.5 91.1 92.2  PLT 258 221 249    . aspirin EC  81 mg Oral QODAY  .  ceFAZolin (ANCEF) IV  2 g Intravenous Q M,W,F-HD  . darbepoetin (ARANESP) injection - DIALYSIS  100 mcg Intravenous Q Thu-HD  . feeding supplement (ENSURE COMPLETE)  237 mL Oral BID BM  . heparin  5,000 Units Subcutaneous 3 times per day  . levothyroxine  200 mcg Oral QAC breakfast  . multivitamin  1 tablet Oral QHS  . sertraline  50 mg Oral Daily  . sevelamer carbonate  800-1,600 mg Oral TID WC  . zolpidem  5 mg Oral Once     acetaminophen, acetaminophen, calcium carbonate, clonazePAM, ondansetron (ZOFRAN) IV, ondansetron, zolpidem

## 2013-10-26 NOTE — Progress Notes (Addendum)
PATIENT DETAILS Name: Bradley [name] Age: 58 y.o. 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 He was initally broadly treated then appropriately narrowed to nafcillin, gentamicin and rifampin. His blood cultures cleared on 2/11/5. He unfortunately developed renal failure and gentamicin was held. He was treated with IV nafcillin and IV rifampin 300 mg IV q 8 hours thru end of February when rifampin changed to 600mg  daily.He was ultimately discharged on 10/10/13 with vancomycin and oral rifampin because he lost IV access besided HD site. HE has AV fistula created in left forearm for HD. His last dose of vanocomcyin on 3/24.  He was seen at the infectious disease clinic on 3/25 weight complained of pain in his chest with deep breathing, and on exam he was found to have tenderness and erythema on the left sternoclavicular joint area. He was then referred to the hospitalist service for further evaluation and treatment. 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  Subjective: No major issues overnight.Tearful and depressed this am  Assessment/Plan: Large paravalvular abscess in the region of the right coronary cusp - Recent history of MSSA bacteremia, continue Ancef-day 4. Was already on antibiotics prior to this admission - blood cultures done on 3/25 negative so far. -  TEE on 3/27 confirmed paravalvular abscess, cardiothoracic surgery and infectious disease consulted.  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 Patient was discharged on 3/13 with IV vancomycin and oral rifampin, he was treated for presumed prosthetic valve endocarditis with septic emboli to the brain. - Unfortunately despite continued IV antibiotics, it looks like he has developed further complications with a CT of the chest done on 3/26 showing a possible large paravalvular abscess/pseudoaneurysm in the region of the right coronary cusp.TEE on 3/27 confirmed a paravalvular abscess. Cardiothoracic surgery planning OR on 3/31  End-stage renal disease - Secondary to ATN from gentamicin or immune complex deposition from endocarditis. - Nephrology on board for hemodialysis.  Positive troponins - Likely secondary to paravalvular abscess/pseudoaneurysm rather than non-STEMI. - Cardiology following.  Recent history of toxic metabolic encephalopathy - From review of his prior admission records, his mental status seems to have improved significantly. Continue to monitor.  Hypothyroidism - Continue with levothyroxine  Anemia - Likely secondary to chronic kidney disease and acute illness. Continue to monitor.  Secondary hyperparathyroidism - Will defer to nephrology  Depression/Anxiety -prn Klonopin -c/w Zoloft -crying/depressed-will ask Psych to see and adjust meds  Disposition: Remain inpatient  DVT Prophylaxis: Prophylactic Heparin   Code Status: Full code   Family Communication None at bedside  Procedures:  None  CONSULTS:  cardiology, ID, nephrology and Cardiothoracic surgery, Psych   MEDICATIONS: Scheduled Meds: . aspirin EC  81 mg Oral QODAY  .  ceFAZolin (ANCEF) IV  2 g Intravenous Q M,W,F-HD  . darbepoetin (ARANESP) injection - DIALYSIS  100 mcg Intravenous Q Thu-HD  . feeding supplement (ENSURE COMPLETE)  237 mL Oral BID BM  .  heparin  5,000 Units Subcutaneous 3 times per day  . levothyroxine  200 mcg  Oral QAC breakfast  . multivitamin  1 tablet Oral QHS  . sertraline  50 mg Oral Daily  . sevelamer carbonate  800-1,600 mg Oral TID WC  . zolpidem  5 mg Oral Once   Continuous Infusions:  PRN Meds:.acetaminophen, acetaminophen, calcium carbonate, clonazePAM, ondansetron (ZOFRAN) IV, ondansetron, zolpidem  Antibiotics: Anti-infectives   Start     Dose/Rate Route Frequency Ordered Stop   10/23/13 0800  ceFAZolin (ANCEF) IVPB 1 g/50 mL premix  Status:  Discontinued     1 g 100 mL/hr over 30 Minutes Intravenous  Once 10/22/13 2313 10/22/13 2326   10/22/13 2359  ceFAZolin (ANCEF) IVPB 2 g/50 mL premix     2 g 100 mL/hr over 30 Minutes Intravenous Every M-W-F (Hemodialysis) 10/22/13 2314     10/22/13 2315  ceFAZolin (ANCEF) IVPB 2 g/50 mL premix  Status:  Discontinued     2 g 100 mL/hr over 30 Minutes Intravenous  Once 10/22/13 2313 10/22/13 2326       PHYSICAL EXAM: Vital signs in last 24 hours: Filed Vitals:   10/25/13 2050 10/25/13 2209 10/26/13 0602 10/26/13 1109  BP: 126/83 120/70 116/76 122/83  Pulse: 89 81 81 90  Temp:  97.7 F (36.5 C) 98.5 F (36.9 C)   TempSrc:  Oral Oral   Resp: 15 16 16    Height:      Weight:      SpO2:  98% 97%     Weight change: 0.638 kg (1 lb 6.5 oz) Filed Weights   10/24/13 1700 10/25/13 1620 10/25/13 2036  Weight: 83.462 kg (184 lb) 84.1 kg (185 lb 6.5 oz) 82.9 kg (182 lb 12.2 oz)   Body mass index is 23.78 kg/(m^2).   Gen Exam: Awake and alert with clear speech.   Neck: Supple, No JVD.   Chest: B/L Clear.   CVS: S1 S2 Regular, systolic murmur Abdomen: soft, BS +, non tender, non distended.  Extremities: no edema, lower extremities warm to touch. Neurologic: Non Focal.   Skin: No Rash.   Wounds: N/A.   Intake/Output from previous day:  Intake/Output Summary (Last 24 hours) at 10/26/13 1111 Last data filed at 10/25/13 2300  Gross per 24 hour  Intake    835 ml  Output    991 ml  Net   -156 ml     LAB  RESULTS: CBC  Recent Labs Lab 10/22/13 2226 10/23/13 1411 10/26/13 0626  WBC 13.1* 3.3* 13.8*  HGB 10.0* 9.0* 10.0*  HCT 30.1* 27.6* 30.9*  PLT 258 221 249  MCV 91.5 91.1 92.2  MCH 30.4 29.7 29.9  MCHC 33.2 32.6 32.4  RDW 19.4* 19.2* 19.4*    Chemistries   Recent Labs Lab 10/22/13 2226 10/23/13 1411 10/26/13 0626  NA 135* 136* 136*  K 3.7 3.7 4.5  CL 96 97 97  CO2 25 28 27   GLUCOSE 116* 114* 96  BUN 13 12 5*  CREATININE 4.19* 3.65* 2.42*  CALCIUM 8.7 8.4 8.4    CBG: No results found for this basename: GLUCAP,  in the last 168 hours  GFR Estimated Creatinine Clearance: 39.1 ml/min (by C-G formula based on Cr of 2.42).  Coagulation profile No results found for this basename: INR, PROTIME,  in the last 168 hours  Cardiac Enzymes  Recent Labs Lab 10/23/13 0254 10/23/13 0802 10/23/13 1411  TROPONINI 0.67* 0.60* 0.71*    No components found  with this basename: POCBNP,  No results found for this basename: DDIMER,  in the last 72 hours No results found for this basename: HGBA1C,  in the last 72 hours No results found for this basename: CHOL, HDL, LDLCALC, TRIG, CHOLHDL, LDLDIRECT,  in the last 72 hours No results found for this basename: TSH, T4TOTAL, FREET3, T3FREE, THYROIDAB,  in the last 72 hours No results found for this basename: VITAMINB12, FOLATE, FERRITIN, TIBC, IRON, RETICCTPCT,  in the last 72 hours No results found for this basename: LIPASE, AMYLASE,  in the last 72 hours  Urine Studies No results found for this basename: UACOL, UAPR, USPG, UPH, UTP, UGL, UKET, UBIL, UHGB, UNIT, UROB, ULEU, UEPI, UWBC, URBC, UBAC, CAST, CRYS, UCOM, BILUA,  in the last 72 hours  MICROBIOLOGY: Recent Results (from the past 240 hour(s))  CLOSTRIDIUM DIFFICILE BY PCR     Status: None   Collection Time    10/22/13 10:04 PM      Result Value Ref Range Status   C difficile by pcr NEGATIVE  NEGATIVE Final  CULTURE, BLOOD (ROUTINE X 2)     Status: None   Collection  Time    10/22/13 10:26 PM      Result Value Ref Range Status   Specimen Description BLOOD RIGHT ARM   Final   Special Requests BOTTLES DRAWN AEROBIC ONLY 3CC   Final   Culture  Setup Time     Final   Value: 10/23/2013 04:42     Performed at [facility]   Culture     Final   Value:        BLOOD CULTURE RECEIVED NO GROWTH TO DATE CULTURE WILL BE HELD FOR 5 DAYS BEFORE ISSUING A FINAL NEGATIVE REPORT     Performed at [facility]   Report Status PENDING   Incomplete  CULTURE, BLOOD (ROUTINE X 2)     Status: None   Collection Time    10/22/13 10:34 PM      Result Value Ref Range Status   Specimen Description BLOOD RIGHT HAND   Final   Special Requests BOTTLES DRAWN AEROBIC ONLY 6CC   Final   Culture  Setup Time     Final   Value: 10/23/2013 04:42     Performed at [facility]   Culture     Final   Value:        BLOOD CULTURE RECEIVED NO GROWTH TO DATE CULTURE WILL BE HELD FOR 5 DAYS BEFORE ISSUING A FINAL NEGATIVE REPORT     Performed at [facility]   Report Status PENDING   Incomplete    RADIOLOGY STUDIES/RESULTS: Dg Chest 2 View  10/23/2013   CLINICAL DATA:  Chest pain  EXAM: CHEST  2 VIEW  COMPARISON:  None.  FINDINGS: Left dialysis catheter removed. New right internal jugular tunneled dialysis catheter. Tip in the right atrium. No pneumothorax. Left basilar atelectasis improved. Lung volumes improved.  IMPRESSION: New right internal jugular dialysis catheter without pneumothorax. Tip is in the right atrium.  Improved left base atelectasis.   Electronically Signed   By: Maryclare Bean M.D.   On: 10/23/2013 08:05   Ct Chest W Contrast  10/23/2013   CLINICAL DATA:  MSSA bacteremia with endocarditis. New sternoclavicular tenderness and manubrial tenderness.  EXAM: CT CHEST WITH CONTRAST  TECHNIQUE: Multidetector CT imaging of the chest was performed during intravenous contrast administration.  CONTRAST:  67mL OMNIPAQUE IOHEXOL 300 MG/ML  SOLN  COMPARISON:  09/06/2013 abdominal CT  FINDINGS: THORACIC INLET/BODY WALL:  Right IJ dialysis catheter, tip at the upper right atrium. Thyroidectomy.  MEDIASTINUM:  Mild cardiomegaly. There is been previous aortic valve replacement and aortic root replacement. There is a new lobulated saccular outpouching arising near the right coronary cusp, which measures approximately 25 mm in diameter. The collection extends anteriorly and superiorly. The surrounding fat is newly infiltrated. There is no pericardial effusion. Coronary artery atherosclerosis. Subendocardial low-attenuation at the mildly thin left ventricular apex suggest previous subendocardial infarction.  Mild mediastinal lymphadenopathy which is likely reactive in the setting of endocarditis.  42 mm diameter aorta just distal to the aortic graft. Diameter stable from 2013 MRI.  LUNG WINDOWS:  No consolidation or edema. Mild dependent atelectasis. Trace left pleural effusion which appears simple.  UPPER ABDOMEN:  Coarse calcification in the posterior segment right lobe liver, likely from previous granulomatous disease. No evidence of embolic phenomenon to the imaged upper abdominal viscera.  OSSEOUS:  No evidence of septic arthritis or osteomyelitis at the site of sternal and manubrial tenderness. Remote left-sided rib fractures.  Critical Value/emergent results were called by telephone at the time of interpretation on 10/23/2013 at 10:18 AM to Dr. Sloan Leiter, who verbally acknowledged these results.  IMPRESSION: 1. 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 Recommended gated cardiac CT or echocardiography. 2. No evidence of septic arthritis or osteomyelitis to explain sternoclavicular tenderness. 3. 42 mm diameter aorta just distal to the aortic graft. Diameter stable from 2013 MRI.   Electronically Signed   By: Jorje Guild M.D.   On: 10/23/2013 10:34   Ir Fluoro Guide Cv Line Right  09/25/2013   INDICATION: Acute renal failure.  Temporary catheter needs conversion to a tunneled dialysis catheter.  EXAM: CONVERSION OF NON TUNNELED CATHETER TO A TUNNELED DIALYSIS CATHETER WITH FLUOROSCOPIC GUIDANCE  Physician: Stephan Minister. Anselm Pancoast, MD  MEDICATIONS: 3 mg versed, 150 mcg fentanyl. The patient was already on scheduled antibiotics. A radiology nurse monitored the patient for moderate sedation.  ANESTHESIA/SEDATION: Moderate sedation time: 25 minutes  FLUOROSCOPY TIME:  24 seconds  PROCEDURE: Informed consent was obtained for conversion to a tunneled dialysis catheter. The existing right jugular catheter and right side of the neck was prepped and draped in sterile fashion. Maximal barrier sterile technique was utilized including caps, mask, sterile gowns, sterile gloves, sterile drape, hand hygiene and skin antiseptic. The skin below the right clavicle was anesthetized with 1% lidocaine. A subcutaneous tract was created and a 23 cm tip to cuff HemoSplit catheter was placed through the tract to the temporary dialysis catheter site. The temporary dialysis catheter was exchanged for a peel-away sheath over a wire. The new dialysis catheter was placed through the peel-away sheath and the tip was placed in the right atrium. Both lumens aspirated and flushed well. Appropriate amount of heparin was placed in both lumens. Gelfoam was placed within the subcutaneous tract. The vein skin site and catheter exit site were sutured with 2-0 Ethilon sutures.  COMPLICATIONS: None  FINDINGS: Catheter tip in the right atrium.  IMPRESSION: Successful conversion of a temporary dialysis catheter to a tunneled dialysis catheter with fluoroscopy.   Electronically Signed   By: Markus Daft M.D.   On: 09/25/2013 16:25    Oren Binet, MD  [facility] Pager:[phone]  If 7PM-7AM, please contact night-coverage [email or web address] Password [email or web address] 10/26/2013, 11:11 AM   LOS: 4 days

## 2013-10-26 NOTE — Progress Notes (Signed)
Per Dr. Jonnie Finner, patient must have dialysis immediately after CT procedure.

## 2013-10-27 ENCOUNTER — Encounter (HOSPITAL_COMMUNITY): Payer: Self-pay | Admitting: Cardiology

## 2013-10-27 ENCOUNTER — Inpatient Hospital Stay (HOSPITAL_COMMUNITY): Payer: Medicaid Other

## 2013-10-27 ENCOUNTER — Other Ambulatory Visit: Payer: Self-pay | Admitting: *Deleted

## 2013-10-27 ENCOUNTER — Ambulatory Visit: Payer: Self-pay | Admitting: Family Medicine

## 2013-10-27 DIAGNOSIS — I359 Nonrheumatic aortic valve disorder, unspecified: Secondary | ICD-10-CM

## 2013-10-27 DIAGNOSIS — Z0289 Encounter for other administrative examinations: Secondary | ICD-10-CM

## 2013-10-27 DIAGNOSIS — I5189 Other ill-defined heart diseases: Secondary | ICD-10-CM

## 2013-10-27 DIAGNOSIS — I719 Aortic aneurysm of unspecified site, without rupture: Secondary | ICD-10-CM

## 2013-10-27 LAB — COMPREHENSIVE METABOLIC PANEL
ALT: 7 U/L (ref 0–53)
AST: 20 U/L (ref 0–37)
Albumin: 2 g/dL — ABNORMAL LOW (ref 3.5–5.2)
Alkaline Phosphatase: 71 U/L (ref 39–117)
BUN: 12 mg/dL (ref 6–23)
CO2: 27 mEq/L (ref 19–32)
Calcium: 8.5 mg/dL (ref 8.4–10.5)
Chloride: 96 mEq/L (ref 96–112)
Creatinine, Ser: 3.16 mg/dL — ABNORMAL HIGH (ref 0.50–1.35)
GFR calc Af Amer: 24 mL/min — ABNORMAL LOW (ref >90)
GFR calc non Af Amer: 20 mL/min — ABNORMAL LOW (ref >90)
Glucose, Bld: 100 mg/dL — ABNORMAL HIGH (ref 70–99)
Potassium: 4.4 mEq/L (ref 3.7–5.3)
Sodium: 136 mEq/L — ABNORMAL LOW (ref 137–147)
Total Bilirubin: 0.3 mg/dL (ref 0.3–1.2)
Total Protein: 6.6 g/dL (ref 6.0–8.3)

## 2013-10-27 LAB — PULMONARY FUNCTION TEST
DL/VA % pred: 162 %
DL/VA: 7.8 ml/min/mmHg/L
DLCO cor % pred: 89 %
DLCO cor: 32.39 ml/min/mmHg
DLCO unc % pred: 74 %
DLCO unc: 27.23 ml/min/mmHg
FEF 25-75 Post: 1.8 L/sec
FEF 25-75 Pre: 1.63 L/sec
FEF2575-%Change-Post: 9 %
FEF2575-%Pred-Post: 52 %
FEF2575-%Pred-Pre: 47 %
FEV1-%Change-Post: 2 %
FEV1-%Pred-Post: 52 %
FEV1-%Pred-Pre: 51 %
FEV1-Post: 2.16 L
FEV1-Pre: 2.12 L
FEV1FVC-%Change-Post: 6 %
FEV1FVC-%Pred-Pre: 94 %
FEV6-%Change-Post: -3 %
FEV6-%Pred-Post: 54 %
FEV6-%Pred-Pre: 56 %
FEV6-Post: 2.83 L
FEV6-Pre: 2.94 L
FEV6FVC-%Change-Post: 0 %
FEV6FVC-%Pred-Post: 104 %
FEV6FVC-%Pred-Pre: 104 %
FVC-%Change-Post: -3 %
FVC-%Pred-Post: 52 %
FVC-%Pred-Pre: 54 %
FVC-Post: 2.83 L
FVC-Pre: 2.94 L
Post FEV1/FVC ratio: 76 %
Post FEV6/FVC ratio: 100 %
Pre FEV1/FVC ratio: 72 %
Pre FEV6/FVC Ratio: 100 %
RV % pred: 47 %
RV: 1.1 L
TLC % pred: 52 %
TLC: 3.96 L

## 2013-10-27 LAB — SURGICAL PCR SCREEN
MRSA, PCR: NEGATIVE
Staphylococcus aureus: NEGATIVE

## 2013-10-27 LAB — BLOOD GAS, ARTERIAL
Acid-Base Excess: 2.5 mmol/L — ABNORMAL HIGH (ref 0.0–2.0)
Bicarbonate: 26.1 mEq/L — ABNORMAL HIGH (ref 20.0–24.0)
Drawn by: 225631
FIO2: 0.21 %
O2 Saturation: 96.9 %
Patient temperature: 98.6
TCO2: 27.3 mmol/L (ref 0–100)
pCO2 arterial: 37.4 mmHg (ref 35.0–45.0)
pH, Arterial: 7.458 — ABNORMAL HIGH (ref 7.350–7.450)
pO2, Arterial: 85 mmHg (ref 80.0–100.0)

## 2013-10-27 LAB — CBC
HCT: 31 % — ABNORMAL LOW (ref 39.0–52.0)
Hemoglobin: 10 g/dL — ABNORMAL LOW (ref 13.0–17.0)
MCH: 30.3 pg (ref 26.0–34.0)
MCHC: 32.3 g/dL (ref 30.0–36.0)
MCV: 93.9 fL (ref 78.0–100.0)
Platelets: 257 10*3/uL (ref 150–400)
RBC: 3.3 MIL/uL — ABNORMAL LOW (ref 4.22–5.81)
RDW: 19.6 % — ABNORMAL HIGH (ref 11.5–15.5)
WBC: 13.5 10*3/uL — ABNORMAL HIGH (ref 4.0–10.5)

## 2013-10-27 LAB — PREPARE RBC (CROSSMATCH)

## 2013-10-27 MED ORDER — SODIUM CHLORIDE 0.9 % IV SOLN
INTRAVENOUS | Status: DC
  Filled 2013-10-27: qty 30

## 2013-10-27 MED ORDER — TRAZODONE HCL 50 MG PO TABS
50.0000 mg | ORAL_TABLET | Freq: Every evening | ORAL | Status: DC | PRN
  Filled 2013-10-27: qty 1

## 2013-10-27 MED ORDER — SODIUM CHLORIDE 0.9 % IV SOLN
INTRAVENOUS | Status: AC
  Administered 2013-10-28: 1 [IU]/h via INTRAVENOUS
  Filled 2013-10-27: qty 1

## 2013-10-27 MED ORDER — CHLORHEXIDINE GLUCONATE CLOTH 2 % EX PADS
6.0000 | MEDICATED_PAD | Freq: Once | CUTANEOUS | Status: AC
Start: 2013-10-28 — End: 2013-10-28
  Administered 2013-10-28: 6 via TOPICAL

## 2013-10-27 MED ORDER — METOPROLOL TARTRATE 12.5 MG HALF TABLET
12.5000 mg | ORAL_TABLET | Freq: Once | ORAL | Status: AC
  Administered 2013-10-28: 12.5 mg via ORAL
  Filled 2013-10-27 (×2): qty 1

## 2013-10-27 MED ORDER — BISACODYL 5 MG PO TBEC: 5.0000 mg | DELAYED_RELEASE_TABLET | Freq: Once | ORAL | Status: DC

## 2013-10-27 MED ORDER — MAGNESIUM SULFATE 50 % IJ SOLN
40.0000 meq | INTRAMUSCULAR | Status: DC
  Filled 2013-10-27: qty 10

## 2013-10-27 MED ORDER — POTASSIUM CHLORIDE 2 MEQ/ML IV SOLN
80.0000 meq | INTRAVENOUS | Status: DC
  Filled 2013-10-27: qty 40

## 2013-10-27 MED ORDER — EPINEPHRINE HCL 1 MG/ML IJ SOLN
0.5000 ug/min | INTRAVENOUS | Status: DC
  Filled 2013-10-27: qty 4

## 2013-10-27 MED ORDER — DOPAMINE-DEXTROSE 3.2-5 MG/ML-% IV SOLN
2.0000 ug/kg/min | INTRAVENOUS | Status: DC
  Filled 2013-10-27: qty 250

## 2013-10-27 MED ORDER — TEMAZEPAM 15 MG PO CAPS: 15.0000 mg | ORAL_CAPSULE | Freq: Once | ORAL | Status: AC | PRN

## 2013-10-27 MED ORDER — HEPARIN SODIUM (PORCINE) 1000 UNIT/ML DIALYSIS: 40.0000 [IU]/kg | INTRAMUSCULAR | Status: DC | PRN

## 2013-10-27 MED ORDER — PLASMA-LYTE 148 IV SOLN
INTRAVENOUS | Status: AC
  Administered 2013-10-28: 09:00:00
  Filled 2013-10-27: qty 2.5

## 2013-10-27 MED ORDER — MIRTAZAPINE 15 MG PO TBDP
15.0000 mg | ORAL_TABLET | Freq: Every day | ORAL | Status: DC
  Administered 2013-10-28: 15 mg via ORAL
  Filled 2013-10-27 (×3): qty 1

## 2013-10-27 MED ORDER — CHLORHEXIDINE GLUCONATE CLOTH 2 % EX PADS
6.0000 | MEDICATED_PAD | Freq: Once | CUTANEOUS | Status: AC
  Administered 2013-10-28: 6 via TOPICAL

## 2013-10-27 MED ORDER — VANCOMYCIN HCL 10 G IV SOLR
1250.0000 mg | INTRAVENOUS | Status: AC
  Administered 2013-10-28: 1250 mg via INTRAVENOUS
  Filled 2013-10-27: qty 1250

## 2013-10-27 MED ORDER — DEXMEDETOMIDINE HCL IN NACL 400 MCG/100ML IV SOLN
0.1000 ug/kg/h | INTRAVENOUS | Status: AC
  Administered 2013-10-28: 0.2 ug/kg/h via INTRAVENOUS
  Filled 2013-10-27: qty 100

## 2013-10-27 MED ORDER — LEVOFLOXACIN IN D5W 500 MG/100ML IV SOLN
500.0000 mg | INTRAVENOUS | Status: DC
  Filled 2013-10-27: qty 100

## 2013-10-27 MED ORDER — DEXTROSE 5 % IV SOLN
750.0000 mg | INTRAVENOUS | Status: DC
  Filled 2013-10-27: qty 750

## 2013-10-27 MED ORDER — ALBUTEROL SULFATE (2.5 MG/3ML) 0.083% IN NEBU
2.5000 mg | INHALATION_SOLUTION | Freq: Once | RESPIRATORY_TRACT | Status: AC
Start: 2013-10-27 — End: 2013-10-27
  Administered 2013-10-27: 2.5 mg via RESPIRATORY_TRACT

## 2013-10-27 MED ORDER — LEVOFLOXACIN IN D5W 750 MG/150ML IV SOLN
750.0000 mg | Freq: Once | INTRAVENOUS | Status: AC
  Administered 2013-10-28: 750 mg via INTRAVENOUS
  Filled 2013-10-27 (×2): qty 150

## 2013-10-27 MED ORDER — DEXTROSE 5 % IV SOLN
30.0000 ug/min | INTRAVENOUS | Status: AC
  Administered 2013-10-28: 25 ug/min via INTRAVENOUS
  Filled 2013-10-27: qty 2

## 2013-10-27 MED ORDER — DEXTROSE 5 % IV SOLN
1.5000 g | INTRAVENOUS | Status: AC
  Administered 2013-10-28: .75 g via INTRAVENOUS
  Administered 2013-10-28: 1.5 g via INTRAVENOUS
  Filled 2013-10-27: qty 1.5

## 2013-10-27 MED ORDER — SODIUM CHLORIDE 0.9 % IV SOLN
INTRAVENOUS | Status: AC
  Administered 2013-10-28: 70 mL via INTRAVENOUS
  Administered 2013-10-28: 13:00:00 via INTRAVENOUS
  Filled 2013-10-27: qty 40

## 2013-10-27 MED ORDER — NITROGLYCERIN IN D5W 200-5 MCG/ML-% IV SOLN
2.0000 ug/min | INTRAVENOUS | Status: AC
  Administered 2013-10-28: 1.5 ug/min via INTRAVENOUS
  Filled 2013-10-27: qty 250

## 2013-10-27 NOTE — Progress Notes (Signed)
Patient refused heparin this morning-education given. Will continue to monitor

## 2013-10-27 NOTE — Progress Notes (Signed)
[facility] progress note. Chief complaint. Left leg numbness and pain. History of present illness. This 58 year old male in hospital with a large left. Valvular abscess and MSSA endocarditis with septic emboli. The patient complained of left leg numbness and pain. I came to see the patient at bedside. He states that his left leg has felt numb all night and is irritating and painful. He states that he has no history of diabetes or back problems. He states he did have a fall recently however. He states that his leg numbness has been happening to him previously at home but not this severe. Vital signs. Temperature 98.3, pulse 85, respiration 18, blood pressure 137/83. O2 sats 95%. General appearance. Well-developed middle-aged male who is alert and in no distress. Cardiac. Rate and rhythm regular with prosthetic valve sound heard. Lungs. Breath sounds are clear. Abdomen. Soft with positive bowel sounds. Extremities. The left lower extremity noted warm to touch. Popliteal pulses strong and pedal pulses are 2+ and palpable. I see no discoloration of the foot or leg. Does appear to have some soft touch deficit. Impression/plan. Problem #1. Left leg numbness. Etiology here is unclear. Septic emboli could be a possibility. Some type of neurologic damage to the left lower extremity such as spinal disc compromise also a possibility. No indication of circulatory compromise at this time. Will defer further action to the rounding physician this morning. Patient requests he can any and ankle area of the left leg and I agreed. He has Tylenol for pain.

## 2013-10-27 NOTE — Progress Notes (Signed)
VASCULAR LAB PRELIMINARY  PRELIMINARY  PRELIMINARY  PRELIMINARY  Pre-op Cardiac Surgery  Carotid Findings:  Bilateral:  1-39% ICA stenosis.  Vertebral artery flow is antegrade.     Ralene Cork, RVT 10/27/2013 1:45 PM      Upper Extremity Right Left  Brachial Pressures    Radial Waveforms pending   Ulnar Waveforms    Palmar Arch (Allen's Test)     Findings:      Lower  Extremity Right Left  Dorsalis Pedis    Anterior Tibial    Posterior Tibial    Ankle/Brachial Indices      Findings:

## 2013-10-27 NOTE — Progress Notes (Signed)
PATIENT DETAILS Name: Bradley [name] Age: 58 y.o. Sex: male Date of Birth: Feb 09, 1956 Admit Date: 10/22/2013 Admitting Physician Kelvin Cellar, MD [id number] W, MD  Brief summary: 58 year old man with hx of prosthetic aortic valve replacement in 2008 and with aortic root correction in 2011 who was admitted on February 10th with severe MSSA bacteremia and endocarditis with septic emboli to brain. He was initally broadly treated then appropriately narrowed to nafcillin, gentamicin and rifampin. His blood cultures cleared on 2/11/5. He unfortunately developed renal failure and gentamicin was held. He was treated with IV nafcillin and IV rifampin 300 mg IV q 8 hours thru end of February when rifampin changed to 600mg  daily.He was ultimately discharged on 10/10/13 with vancomycin and oral rifampin because he lost IV access besided HD site. HE has AV fistula created in left forearm for HD. His last dose of vanocomcyin on 3/24.  He was seen at the infectious disease clinic on 3/25 weight complained of pain in his chest with deep breathing, and on exam he was found to have tenderness and erythema on the left sternoclavicular joint area. He was then referred to the hospitalist service for further evaluation and treatment. 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 TEE confirms a paravalvular abscess, a cardiac CT also confirms a paravalvular abscess  Subjective: Left leg numbness and pain occurred last night, this has completely resolved this morning. No other complaints- except insomnia and anxiety.  Assessment/Plan: Large paravalvular abscess in the region of the right coronary cusp - Recent history of MSSA bacteremia, continue Ancef-day 5. Was already on antibiotics prior to this admission - blood  cultures done on 3/25 negative so far. -  TEE on 3/27 confirmed paravalvular abscess, cardiothoracic surgery and infectious disease consulted. 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 Patient was discharged on 3/13 with IV vancomycin and oral rifampin, he was treated for presumed prosthetic valve endocarditis with septic emboli to the brain. - Unfortunately despite continued IV antibiotics, it looks like he has developed further complications with a CT of the chest done on 3/26 showing a possible large paravalvular abscess/pseudoaneurysm in the region of the right coronary cusp.TEE on 3/27 confirmed a paravalvular abscess. Cardiothoracic surgery planning OR on 3/31  End-stage renal disease - Secondary to ATN from gentamicin or immune complex deposition from endocarditis. - Nephrology on board for hemodialysis.  Positive troponins - Likely secondary to paravalvular abscess/pseudoaneurysm rather than non-STEMI. - Cardiology following.  Recent history of toxic metabolic encephalopathy - From review of his prior admission records, his mental status seems to have improved significantly. Continue to monitor.  Hypothyroidism - Continue with levothyroxine  Anemia - Likely secondary to chronic kidney disease and acute illness. Continue to monitor.  Secondary hyperparathyroidism - Will defer to nephrology  Depression/Anxiety -prn Klonopin -c/w Zoloft -crying/depressed-will ask Psych to see and adjust meds  Disposition: Remain inpatient  DVT Prophylaxis: Prophylactic Heparin   Code Status: Full code   Family Communication None at bedside  Procedures:  None  CONSULTS:  cardiology, ID, nephrology and Cardiothoracic surgery, Psych   MEDICATIONS: Scheduled Meds: . aspirin EC  81 mg Oral QODAY  .  ceFAZolin (ANCEF) IV  2 g Intravenous Q M,W,F-HD  . darbepoetin  (ARANESP) injection - DIALYSIS  100 mcg Intravenous Q Thu-HD  . feeding supplement (ENSURE COMPLETE)  237 mL Oral BID BM  . heparin  5,000 Units Subcutaneous 3 times per day  . [START ON 10/28/2013] influenza vac split quadrivalent PF  0.5 mL Intramuscular Once  . levothyroxine  200 mcg Oral QAC breakfast  . multivitamin  1 tablet Oral QHS  . sertraline  50 mg Oral Daily  . sevelamer carbonate  800-1,600 mg Oral TID WC  . sodium chloride  3 mL Intravenous Q12H  . sodium chloride  3 mL Intravenous Q12H  . zolpidem  5 mg Oral Once   Continuous Infusions:  PRN Meds:.acetaminophen, acetaminophen, calcium carbonate, clonazePAM, nitroGLYCERIN, ondansetron (ZOFRAN) IV, ondansetron, zolpidem  Antibiotics: Anti-infectives   Start     Dose/Rate Route Frequency Ordered Stop   10/23/13 0800  ceFAZolin (ANCEF) IVPB 1 g/50 mL premix  Status:  Discontinued     1 g 100 mL/hr over 30 Minutes Intravenous  Once 10/22/13 2313 10/22/13 2326   10/22/13 2359  ceFAZolin (ANCEF) IVPB 2 g/50 mL premix     2 g 100 mL/hr over 30 Minutes Intravenous Every M-W-F (Hemodialysis) 10/22/13 2314     10/22/13 2315  ceFAZolin (ANCEF) IVPB 2 g/50 mL premix  Status:  Discontinued     2 g 100 mL/hr over 30 Minutes Intravenous  Once 10/22/13 2313 10/22/13 2326       PHYSICAL EXAM: Vital signs in last 24 hours: Filed Vitals:   10/26/13 1530 10/26/13 2111 10/27/13 0318 10/27/13 0533  BP: 110/70 113/68  137/83  Pulse: 77 79  85  Temp: 98.4 F (36.9 C) 99 F (37.2 C)  98.3 F (36.8 C)  TempSrc: Oral Oral  Oral  Resp:  16  18  Height:      Weight:   83.1 kg (183 lb 3.2 oz)   SpO2: 98% 95%  95%    Weight change: -1 kg (-2 lb 3.3 oz) Filed Weights   10/25/13 1620 10/25/13 2036 10/27/13 0318  Weight: 84.1 kg (185 lb 6.5 oz) 82.9 kg (182 lb 12.2 oz) 83.1 kg (183 lb 3.2 oz)   Body mass index is 23.84 kg/(m^2).   Gen Exam: Awake and alert with clear speech.   Neck: Supple, No JVD.   Chest: B/L Clear.   CVS: S1  S2 Regular, systolic murmur Abdomen: soft, BS +, non tender, non distended.  Extremities: no edema, lower extremities warm to touch. Neurologic: Non Focal.   Skin: No Rash.   Wounds: N/A.   Intake/Output from previous day:  Intake/Output Summary (Last 24 hours) at 10/27/13 1353 Last data filed at 10/27/13 1257  Gross per 24 hour  Intake    241 ml  Output    200 ml  Net     41 ml     LAB RESULTS: CBC  Recent Labs Lab 10/22/13 2226 10/23/13 1411 10/26/13 0626  WBC 13.1* 3.3* 13.8*  HGB 10.0* 9.0* 10.0*  HCT 30.1* 27.6* 30.9*  PLT 258 221 249  MCV 91.5 91.1 92.2  MCH 30.4 29.7 29.9  MCHC 33.2 32.6 32.4  RDW 19.4* 19.2* 19.4*    Chemistries   Recent Labs Lab 10/22/13 2226 10/23/13 1411 10/26/13 0626  NA 135* 136* 136*  K 3.7 3.7 4.5  CL 96 97 97  CO2 25 28 27   GLUCOSE 116* 114* 96  BUN 13 12 5*  CREATININE 4.19* 3.65* 2.42*  CALCIUM 8.7 8.4 8.4    CBG: No results found for this basename: GLUCAP,  in the last 168 hours  GFR Estimated Creatinine Clearance: 39.1 ml/min (by C-G formula based on Cr of 2.42).  Coagulation profile No results found for this basename: INR, PROTIME,  in the last 168 hours  Cardiac Enzymes  Recent Labs Lab 10/23/13 0254 10/23/13 0802 10/23/13 1411  TROPONINI 0.67* 0.60* 0.71*    No components found with this basename: POCBNP,  No results found for this basename: DDIMER,  in the last 72 hours No results found for this basename: HGBA1C,  in the last 72 hours No results found for this basename: CHOL, HDL, LDLCALC, TRIG, CHOLHDL, LDLDIRECT,  in the last 72 hours No results found for this basename: TSH, T4TOTAL, FREET3, T3FREE, THYROIDAB,  in the last 72 hours No results found for this basename: VITAMINB12, FOLATE, FERRITIN, TIBC, IRON, RETICCTPCT,  in the last 72 hours No results found for this basename: LIPASE, AMYLASE,  in the last 72 hours  Urine Studies No results found for this basename: UACOL, UAPR, USPG, UPH, UTP,  UGL, UKET, UBIL, UHGB, UNIT, UROB, ULEU, UEPI, UWBC, URBC, UBAC, CAST, CRYS, UCOM, BILUA,  in the last 72 hours  MICROBIOLOGY: Recent Results (from the past 240 hour(s))  CLOSTRIDIUM DIFFICILE BY PCR     Status: None   Collection Time    10/22/13 10:04 PM      Result Value Ref Range Status   C difficile by pcr NEGATIVE  NEGATIVE Final  CULTURE, BLOOD (ROUTINE X 2)     Status: None   Collection Time    10/22/13 10:26 PM      Result Value Ref Range Status   Specimen Description BLOOD RIGHT ARM   Final   Special Requests BOTTLES DRAWN AEROBIC ONLY 3CC   Final   Culture  Setup Time     Final   Value: 10/23/2013 04:42     Performed at [facility]   Culture     Final   Value:        BLOOD CULTURE RECEIVED NO GROWTH TO DATE CULTURE WILL BE HELD FOR 5 DAYS BEFORE ISSUING A FINAL NEGATIVE REPORT     Performed at [facility]   Report Status PENDING   Incomplete  CULTURE, BLOOD (ROUTINE X 2)     Status: None   Collection Time    10/22/13 10:34 PM      Result Value Ref Range Status   Specimen Description BLOOD RIGHT HAND   Final   Special Requests BOTTLES DRAWN AEROBIC ONLY 6CC   Final   Culture  Setup Time     Final   Value: 10/23/2013 04:42     Performed at [facility]   Culture     Final   Value:        BLOOD CULTURE RECEIVED NO GROWTH TO DATE CULTURE WILL BE HELD FOR 5 DAYS BEFORE ISSUING A FINAL NEGATIVE REPORT     Performed at [facility]   Report Status PENDING   Incomplete    RADIOLOGY STUDIES/RESULTS: Dg Chest 2 View  10/23/2013   CLINICAL DATA:  Chest pain  EXAM: CHEST  2 VIEW  COMPARISON:  None.  FINDINGS: Left dialysis catheter removed. New right internal jugular tunneled dialysis catheter. Tip in the right atrium. No pneumothorax. Left basilar atelectasis improved. Lung volumes improved.  IMPRESSION: New right internal jugular dialysis catheter without pneumothorax. Tip is in the right atrium.  Improved left base atelectasis.    Electronically Signed   By: Maryclare Bean M.D.   On: 10/23/2013 08:05   Ct Chest W Contrast  10/23/2013   CLINICAL DATA:  MSSA bacteremia with endocarditis. New sternoclavicular tenderness and manubrial tenderness.  EXAM: CT CHEST WITH CONTRAST  TECHNIQUE: Multidetector CT imaging of the chest was performed during intravenous contrast administration.  CONTRAST:  14mL OMNIPAQUE IOHEXOL 300 MG/ML  SOLN  COMPARISON:  09/06/2013 abdominal CT  FINDINGS: THORACIC INLET/BODY WALL:  Right IJ dialysis catheter, tip at the upper right atrium. Thyroidectomy.  MEDIASTINUM:  Mild cardiomegaly. There is been previous aortic valve replacement and aortic root replacement. There is a new lobulated saccular outpouching arising near the right coronary cusp, which measures approximately 25 mm in diameter. The collection extends anteriorly and superiorly. The surrounding fat is newly infiltrated. There is no pericardial effusion. Coronary artery atherosclerosis. Subendocardial low-attenuation at the mildly thin left ventricular apex suggest previous subendocardial infarction.  Mild mediastinal lymphadenopathy which is likely reactive in the setting of endocarditis.  42 mm diameter aorta just distal to the aortic graft. Diameter stable from 2013 MRI.  LUNG WINDOWS:  No consolidation or edema. Mild dependent atelectasis. Trace left pleural effusion which appears simple.  UPPER ABDOMEN:  Coarse calcification in the posterior segment right lobe liver, likely from previous granulomatous disease. No evidence of embolic phenomenon to the imaged upper abdominal viscera.  OSSEOUS:  No evidence of septic arthritis or osteomyelitis at the site of sternal and manubrial tenderness. Remote left-sided rib fractures.  Critical Value/emergent results were called by telephone at the time of interpretation on 10/23/2013 at 10:18 AM to Dr. Sloan Leiter, who verbally acknowledged these results.  IMPRESSION: 1. Large paravalvular abscess/pseudoaneurysm in the  region of the right coronary cusp. Recommended gated cardiac CT or echocardiography. 2. No evidence of septic arthritis or osteomyelitis to explain sternoclavicular tenderness. 3. 42 mm diameter aorta just distal to the aortic graft. Diameter stable from 2013 MRI.   Electronically Signed   By: Jorje Guild M.D.   On: 10/23/2013 10:34   Ir Fluoro Guide Cv Line Right  09/25/2013   INDICATION: Acute renal failure. Temporary catheter needs conversion to a tunneled dialysis catheter.  EXAM: CONVERSION OF NON TUNNELED CATHETER TO A TUNNELED DIALYSIS CATHETER WITH FLUOROSCOPIC GUIDANCE  Physician: Stephan Minister. Anselm Pancoast, MD  MEDICATIONS: 3 mg versed, 150 mcg fentanyl. The patient was already on scheduled antibiotics. A radiology nurse monitored the patient for moderate sedation.  ANESTHESIA/SEDATION: Moderate sedation time: 25 minutes  FLUOROSCOPY TIME:  24 seconds  PROCEDURE: Informed consent was obtained for conversion to a tunneled dialysis catheter. The existing right jugular catheter and right side of the neck was prepped and draped in sterile fashion. Maximal barrier sterile technique was utilized including caps, mask, sterile gowns, sterile gloves, sterile drape, hand hygiene and skin antiseptic. The skin below the right clavicle was anesthetized with 1% lidocaine. A subcutaneous tract was created and a 23 cm tip to cuff HemoSplit catheter was placed through the tract to the temporary dialysis catheter site. The temporary dialysis catheter was exchanged for a peel-away sheath over a wire. The new dialysis catheter was placed through the peel-away sheath and the tip was placed in the right atrium. Both lumens aspirated and flushed well. Appropriate amount of heparin was placed in both lumens. Gelfoam was placed within the subcutaneous tract. The vein skin site and catheter exit site were sutured with 2-0 Ethilon sutures.  COMPLICATIONS: None  FINDINGS: Catheter tip  in the right atrium.  IMPRESSION: Successful conversion  of a temporary dialysis catheter to a tunneled dialysis catheter with fluoroscopy.   Electronically Signed   By: Markus Daft M.D.   On: 09/25/2013 16:25    Oren Binet, MD  [facility] Pager:[phone]  If 7PM-7AM, please contact night-coverage [email or web address] Password [email or web address] 10/27/2013, 1:53 PM   LOS: 5 days

## 2013-10-27 NOTE — Progress Notes (Signed)
Patient ID: Bradley [name], male   DOB: 1955-09-01, 58 y.o.   MRN: [id number]   I have reviewed the cardiology notes. There is a complete report from the cardiac CT scan by Dr. Meda Coffee. It is my understanding that this information will be integrated into the final plan by cardiothoracic surgery. There is nothing that I can add today.  Daryel November, MD

## 2013-10-27 NOTE — Progress Notes (Signed)
Patient ID: Bradley [name], male   DOB: 12-11-1955, 58 y.o.   MRN: [id number]  [facility] Progress Note   Assessment/ Plan:   1 Paravalvular abscess/Prosthetic valve endocarditis: on cefazolin, hemodynamically stable and re-do valvular surgery/ abscess drainage will be done tomorrow per TCTS 2 ESRD on hemodialysis- had AKI from gent / ATN vs immune complex and remains dialysis dependent now >6weeks- no signs of recovery, HD-dependent w AVF in place. Remains oliguric, makes "1/2 cup a day" of urine. On a TTS HD schedule but will do HD today in anticipation for surgery tomorrow. 3 Recent MSSA bacteremia w septic cerebral emboli / endocarditis ( Hx AoV replacement d/t congenital abnormality) 4 Anemia Hb 10 on darbe 100/wk - re-dose iron after infection fully controlled 5 HTN/volume- no BP meds, BP low-normal, no extra vol 6.Pain and numbness left leg- appears to be from pressure/accidental injury in bed 7. Depression: wishes to be seen by a psychiatrist  Subjective:   Overnight events noted with numbness and pain of left leg- currently better (suspects he might have banged it against bed-rail/laid on it last night    Objective:   BP 137/83  Pulse 85  Temp(Src) 98.3 F (36.8 C) (Oral)  Resp 18  Ht 6' 1.5" (1.867 m)  Wt 83.1 kg (183 lb 3.2 oz)  BMI 23.84 kg/m2  SpO2 95%  Physical Exam: [id number] resting in bed [id number] RRR, 3/6 HSM with Mechanical S2 Resp:CTA bilaterally, no rales/rhonchi [id number], flat, NT, BS normal Ext:No LE edema. Maturing L RCF  Labs: BMET  Recent Labs Lab 10/22/13 2226 10/23/13 1411 10/26/13 0626  NA 135* 136* 136*  K 3.7 3.7 4.5  CL 96 97 97  CO2 25 28 27   GLUCOSE 116* 114* 96  BUN 13 12 5*  CREATININE 4.19* 3.65* 2.42*  CALCIUM 8.7 8.4 8.4  PHOS  --  3.9 2.6   CBC  Recent Labs Lab 10/22/13 2226 10/23/13 1411 10/26/13 0626  WBC 13.1* 3.3* 13.8*  HGB 10.0* 9.0* 10.0*  HCT 30.1* 27.6* 30.9*  MCV 91.5 91.1 92.2  PLT  258 221 249   Medications:    . aspirin EC  81 mg Oral QODAY  .  ceFAZolin (ANCEF) IV  2 g Intravenous Q M,W,F-HD  . darbepoetin (ARANESP) injection - DIALYSIS  100 mcg Intravenous Q Thu-HD  . feeding supplement (ENSURE COMPLETE)  237 mL Oral BID BM  . heparin  5,000 Units Subcutaneous 3 times per day  . [START ON 10/28/2013] influenza vac split quadrivalent PF  0.5 mL Intramuscular Once  . levothyroxine  200 mcg Oral QAC breakfast  . multivitamin  1 tablet Oral QHS  . sertraline  50 mg Oral Daily  . sevelamer carbonate  800-1,600 mg Oral TID WC  . sodium chloride  3 mL Intravenous Q12H  . sodium chloride  3 mL Intravenous Q12H  . zolpidem  5 mg Oral Once   Elmarie Shiley, MD 10/27/2013, 8:16 AM

## 2013-10-27 NOTE — Progress Notes (Signed)
ANTIBIOTIC CONSULT NOTE - INITIAL  Pharmacy Consult for Levaquin Indication: PV endocarditis, adjunctive to cefazolin  Allergies  Allergen Reactions  . Oxycodone     Gives patient nightmares  . Rifampin Nausea Only    Patient Measurements: Height: 6' 1.5" (186.7 cm) Weight: 183 lb 3.2 oz (83.1 kg) IBW/kg (Calculated) : 81.05  Vital Signs: Temp: 98.3 F (36.8 C) (03/30 0533) Temp src: Oral (03/30 0533) BP: 137/83 mmHg (03/30 0533) Pulse Rate: 85 (03/30 0533)  Labs:  Recent Labs  10/26/13 0626  WBC 13.8*  HGB 10.0*  PLT 249  CREATININE 2.42*   ESRD - usual TTS hemodialysis  Medical History: Past Medical History  Diagnosis Date  . Anxiety   . Depression   . Hypertension   . Hypothyroidism, postsurgical   . Aortic stenosis     s/p [place] with bioprosthetic AVR 02/2010; Last echo (9/11): Moderate LVH, EF 45-50%, AVR functioning appropriately, aortic valve mean gradient 21, diastolic dysfunction. Chest MRA (2/13): Mild to moderate dilatation at the sinus of Valsalva at 4.1 cm, mild dilatation ascending aorta distal to the tube graft at 3.9 cm, moderate dilatation of the innominate artery a 2.1 cm;    . Hx of cardiac cath     a. LHC in 02/2010: normal cors  . Hx of echocardiogram 2014    Echo (10/14): Severe LVH, EF 60-65%, normal wall motion, grade 1 diastolic dysfunction, AVR functioning normally, mild aortic stenosis (mean 19), moderately dilated aorta, mild LAE, mild RVE  . Staphylococcus aureus bacteremia   . Prosthetic valve endocarditis   . Thyroid cancer   . Heart murmur   . ESRD on dialysis     "[place]; TTS" (10/22/2013)   Assessment:   Paravalvular abscess/prosthetic valve endocarditis with MSSA.  Day #5 Cefazolin; adding Levaquin as adjunct today. Cefazolin 2 grams IV currently scheduled after dialysis, MWF. (Usual TTS HD.) HD planned for later today.  For OR on 3/31. Re-do valve surgery and abscess drainage.  Goal of Therapy:   appropriate  Levaquin dose for renal function & infection  Plan:   Levaquin 750 mg IV x 1 today, then 500 mg IV q48hrs.  Cefazolin 2 grams IV to be given after dialysis today.  Off usual HD schedule; for OR on 3/31.  Will follow up post-op 3/31.  Arty Baumgartner, [place] Pager: [phone] 10/27/2013,3:25 PM

## 2013-10-27 NOTE — Progress Notes (Signed)
Patient ID: Bradley [name], male   DOB: 06-23-1956, 58 y.o.   MRN: [id number]      [address]       [place] [address]             [phone]                 3 Days Post-Op Procedure(s) (LRB): TRANSESOPHAGEAL ECHOCARDIOGRAM (TEE) (N/A)  LOS: 5 days   Subjective: Feels ok today, no chills  Objective: Vital signs in last 24 hours: Patient Vitals for the past 24 hrs:  BP Temp Temp src Pulse Resp SpO2 Weight  10/27/13 0533 137/83 mmHg 98.3 F (36.8 C) Oral 85 18 95 % -  10/27/13 0318 - - - - - - 183 lb 3.2 oz (83.1 kg)  10/26/13 2111 113/68 mmHg 99 F (37.2 C) Oral 79 16 95 % -  10/26/13 1530 110/70 mmHg 98.4 F (36.9 C) Oral 77 - 98 % -    Filed Weights   10/25/13 1620 10/25/13 2036 10/27/13 0318  Weight: 185 lb 6.5 oz (84.1 kg) 182 lb 12.2 oz (82.9 kg) 183 lb 3.2 oz (83.1 kg)    Hemodynamic parameters for last 24 hours:    Intake/Output from previous day: 03/29 0701 - 03/30 0700 In: 345 [P.O.:342; I.V.:3] Out: 600 [Urine:600] Intake/Output this shift: Total I/O In: 118 [P.O.:118] Out: -   Scheduled Meds: . aspirin EC  81 mg Oral QODAY  .  ceFAZolin (ANCEF) IV  2 g Intravenous Q M,W,F-HD  . darbepoetin (ARANESP) injection - DIALYSIS  100 mcg Intravenous Q Thu-HD  . feeding supplement (ENSURE COMPLETE)  237 mL Oral BID BM  . heparin  5,000 Units Subcutaneous 3 times per day  . [START ON 10/28/2013] influenza vac split quadrivalent PF  0.5 mL Intramuscular Once  . levothyroxine  200 mcg Oral QAC breakfast  . multivitamin  1 tablet Oral QHS  . sertraline  50 mg Oral Daily  . sevelamer carbonate  800-1,600 mg Oral TID WC  . sodium chloride  3 mL Intravenous Q12H  . sodium chloride  3 mL Intravenous Q12H  . zolpidem  5 mg Oral Once   Continuous Infusions:  PRN Meds:.acetaminophen, acetaminophen, calcium carbonate, clonazePAM, nitroGLYCERIN, ondansetron (ZOFRAN) IV, ondansetron, traZODone, zolpidem  General appearance: alert, cooperative and  appears older than stated age Neurologic: intact Heart: regular rate and rhythm, S1, S2 normal, no murmur, click, rub or gallop Lungs: diminished breath sounds bibasilar Abdomen: soft, non-tender; bowel sounds normal; no masses,  no organomegaly Extremities: extremities normal, atraumatic, no cyanosis or edema and Homans sign is negative, no sign of DVT Wound: sternum stable  Lab Results: CBC: Recent Labs  10/26/13 0626  WBC 13.8*  HGB 10.0*  HCT 30.9*  PLT 249   BMET:  Recent Labs  10/26/13 0626  NA 136*  K 4.5  CL 97  CO2 27  GLUCOSE 96  BUN 5*  CREATININE 2.42*  CALCIUM 8.4    PT/INR: No results found for this basename: LABPROT, INR,  in the last 72 hours   Radiology Ct Cardiac Morph/pulm Vein W/cm&w/o Ca Score  10/26/2013   CLINICAL DATA:  58 year old male with prior AVR with 23 mm bioprosthetic valve and aortic root replacement with 28 mm graft (Bentall procedure on 03/02/2010). 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  EXAM: Cardiac CT/Coronary CTA  TECHNIQUE: The patient was scanned on a Siemens 123456 slice scanner. Axial gated 123456 mm slices were  carried out through the heart at 70 % to obtain coronary vessels. A retrospective acquisition in every 10% of the R-R interval were performed to acquire functional image series. The data set was analyzed on a dedicated [employer] work station.  FINDINGS: Non-cardiac: No significant non cardiac findings on limited lung and soft tissue windows. See separate report from [facility] Radiology.  S/p AVR with 23 mm bioprosthetic valve and aortic root replacement with 28 mm graft. Re-implanted coronaries.  There is a large multi-lobulated abscess that is encroaching 270 degrees of the aortic root located antero-bilaterally (sparing posterior portion of the aortic root). The abscess is located around right and left coronary cusp with the largest portion around the right coronary cusp. The abscess measures 58 mm  (antero-posteriorly), x 45 mm (superio-inferiorly) x 21 mm (left to right). There is are two entry points - at the right coronary cusp and a large open connection at the right lateral LVOT. The abscess cavity enlarges significantly during systole with dynamic systolic obstruction of the proximal RCA. The leaflets of the bioprosthetic valve open well.  Ascending Aorta: There is no evidence of infection around the ascending aortic graft.  Bioprosthetic valve diameter:  32 mm  Sinuses of Valsalva:  35 mm  Sinotubular junction:  20 mm  Ascending aorta:  29 x 32 mm  Distal graft ending the at the distal portion of the ascending aorta.  Proximal aortic arch (post graft) is dilated measuring 41 x 40 mm.  Distal aortic arch:  31 mm  Descending thoracic aorta:  30 mm  Patent origin of the brachiocephalic arteries.  Coronary arteries:  Right dominance.  Left main: Gives rise to LAD and LCX. No significant plague is seen in LM.  LAD: Large vessel that wraps around the apex and gives rise to 2 diagonal branches. There is no significant disease in the LAD territory.  LCX: Is a moderate caliber vessel that gives rise to a large obtuse marginal branch. OM1 further sub-branches. There is no significant atherosclerotic plague in the LCX territory.  RCA: Large caliber vessel that gives rise to PDA and PLVB, mild noncalcified plague in the mid RCA with associated 0-25% stenosis.  Other findings: Central line in the SVC terminating in the right atrium.  IMPRESSION: 1. There is a large paravalvular abscess around the root of the prosthetic aortic valve measuring 58 x 45 x 21 mm located antero- bilaterally (sparing posterior portion of the aortic root) with dynamic obstruction of RCA in systole.  2. Dilated proximal portion of the aortic arch (post graft) measuring 41 x 40 mm.  3. Normal coronaries.  Right dominance.  Bradley [name]   Electronically Signed   By: Bradley [name]   On: 10/26/2013 15:36     Assessment/Plan: S/P  Procedure(s) (LRB): TRANSESOPHAGEAL ECHOCARDIOGRAM (TEE) (N/A)  I have reviewed with the patient and his wife in detail the findings of the TEE and CT scan. The patient has a periaortic valve abscess in the setting of endocarditis with staph. I have explained to the patient and his wife that the only way to remedy this he is with a redo Bentall., The patient will now be a 3 time redo and is in renal failure on dialysis. However without surgical correction he will not survive. The patient and his wife are aware of this. I discussed with him possible use of a homograft to reconstruct the infected area. The goals risks and alternatives of the planned surgical procedure  redo sternotomy with redo Bentall  have been discussed with the patient in detail. The risks of the procedure including death, infection, stroke, myocardial infarction, bleeding, blood transfusion have all been discussed specifically.  I have quoted [name] a 20 % of perioperative mortality and a complication rate as high as  60 %. The patient's questions have been answered.[name] is willing  to proceed with the planned procedure.  [name] MD 10/27/2013 2:17 PM

## 2013-10-27 NOTE — Consult Note (Signed)
Reason for Consult: Depression Referring Physician: Jonetta Osgood, MD   Bradley [name] is an 58 y.o. male.  HPI: Patient was seen and chart reviewed. Patient has been suffering with increased symptoms of depression, disturbance of sleep, nightmares and extreme anxiety about major cardiac procedure scheduled for tomorrow. Patient stated he cannot relax during nighttime and daytime he has been disturbed by several people/staff members throughout the day. Patient feels he has been physically and mentally exhausted and has a negative thinking about the procedure. Patient stated he has been feeling he has been a burden to his family and has a passive thoughts about not waking up next day but denies active suicidal ideation, intentions or plans. Patient is supported by his family and he reported he has a trust with the doctor who is performing the procedure. Patient also reported he had 2 previous cardiac procedures which went well and recovered without difficulties. Patient has a 4 children ages from 48-31. Patient has been compliant with his medications and has no reported adverse effects.  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 He was initally broadly treated then appropriately narrowed to nafcillin, gentamicin and rifampin. His blood cultures cleared on 2/11/5. He unfortunately developed renal failure and gentamicin was held. He was treated with IV nafcillin and IV rifampin 300 mg IV q 8 hours thru end of February when rifampin changed to 655m daily.He was ultimately discharged on 10/10/13 with vancomycin and oral rifampin because he lost IV access besided HD site. HE has AV fistula created in left forearm for HD. His last dose of vanocomcyin on 3/24. He was seen at the infectious disease clinic on 3/25  weight complained of pain in his chest with deep breathing, and on exam he was found to have tenderness and erythema on the left sternoclavicular joint area. He was then referred to the hospitalist service for further evaluation and treatment. 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  Mental Status Examination: Patient appeared as per his stated age,  decrease in psychomotor activity, with depression mood and anxious affect. He has normal rate, rhythm, and low volume of speech. His thought process is linear and goal directed. Patient has denied suicidal, homicidal ideations, intentions or plans. Patient has no evidence of auditory or visual hallucinations, delusions, and paranoia. Patient has fair insight judgment and impulse control.  Past Medical History  Diagnosis Date  . Anxiety   . Depression   . Hypertension   . Hypothyroidism, postsurgical   . Aortic stenosis     s/p Bentall with bioprosthetic AVR 02/2010; Last echo (9/11): Moderate LVH, EF 45-50%, AVR functioning appropriately, aortic valve mean gradient 21, diastolic dysfunction. Chest MRA (2/13): Mild to moderate dilatation at the sinus of Valsalva at 4.1 cm, mild dilatation ascending aorta distal to the tube graft at 3.9 cm, moderate dilatation of the innominate artery a 2.1 cm;    . Hx of cardiac cath     a. LHC in 02/2010: normal cors  . Hx of echocardiogram 2014    Echo (10/14): Severe LVH, EF 60-65%, normal wall motion, grade 1 diastolic dysfunction, AVR  functioning normally, mild aortic stenosis (mean 19), moderately dilated aorta, mild LAE, mild RVE  . Staphylococcus aureus bacteremia   . Prosthetic valve endocarditis   . Thyroid cancer   . Heart murmur   . ESRD on dialysis     "[place]; TTS" (10/22/2013)    Past Surgical History   Procedure Laterality Date  . Cardiac catheterization  03/02/2010    NORMAL CORONARY ARTERY  . Sternotomy      REDO  . Transthoracic echocardiogram  03/2010    SHOWED MILD REDUCTION OF LV FUNCTION  . Thyroidectomy  ~ 2005  . Shoulder arthroscopy w/ rotator cuff repair Right 2012  . Av fistula placement Left 10/08/2013    Procedure: ARTERIOVENOUS (AV) FISTULA CREATION- LEFT ARM; Radial Cephalic ;  Surgeon: Mal [name], MD;  Location: [facility];  Service: Vascular;  Laterality: Left;  . Aortic valve replacement  2011  . Cardiac valve replacement    . Aortic valve repair  1968  . Tee without cardioversion N/A 10/24/2013    Procedure: TRANSESOPHAGEAL ECHOCARDIOGRAM (TEE);  Surgeon: Dorothy Spark, MD;  Location: [facility] ENDOSCOPY;  Service: Cardiovascular;  Laterality: N/A;    Family History  Problem Relation Age of Onset  . Heart disease Father     Social History:  reports that he has never smoked. He has never used smokeless tobacco. He reports that he does not drink alcohol or use illicit drugs.  Allergies:  Allergies  Allergen Reactions  . Oxycodone     Gives patient nightmares  . Rifampin Nausea Only    Medications: I have reviewed the patient's current medications.  Results for orders placed during the hospital encounter of 10/22/13 (from the past 48 hour(s))  CBC     Status: Abnormal   Collection Time    10/26/13  6:26 AM      Result Value Ref Range   WBC 13.8 (*) 4.0 - 10.5 K/uL   RBC 3.35 (*) 4.22 - 5.81 MIL/uL   Hemoglobin 10.0 (*) 13.0 - 17.0 g/dL   HCT 30.9 (*) 39.0 - 52.0 %   MCV 92.2  78.0 - 100.0 fL   MCH 29.9  26.0 - 34.0 pg   MCHC 32.4  30.0 - 36.0 g/dL   RDW 19.4 (*) 11.5 - 15.5 %   Platelets 249  150 - 400 K/uL  RENAL FUNCTION PANEL     Status: Abnormal   Collection Time    10/26/13  6:26 AM      Result Value Ref Range   Sodium 136 (*) 137 - 147 mEq/L   Potassium 4.5  3.7 - 5.3 mEq/L   Chloride 97  96 - 112 mEq/L   CO2 27  19 - 32 mEq/L   Glucose, Bld  96  70 - 99 mg/dL   BUN 5 (*) 6 - 23 mg/dL   Creatinine, Ser 2.42 (*) 0.50 - 1.35 mg/dL   Calcium 8.4  8.4 - 10.5 mg/dL   Phosphorus 2.6  2.3 - 4.6 mg/dL   Albumin 2.0 (*) 3.5 - 5.2 g/dL   GFR calc non Af Amer 28 (*) >90 mL/min   GFR calc Af Amer 33 (*) >90 mL/min   Comment: (NOTE)     The eGFR has been calculated using the CKD EPI equation.     This calculation has not been validated in all clinical situations.     eGFR's persistently <90 mL/min signify possible Chronic Kidney     Disease.  Ct Cardiac Morph/pulm Vein W/cm&w/o Ca Score  10/26/2013   CLINICAL DATA:  58 year old male with prior AVR with 23 mm bioprosthetic valve and aortic root replacement with 28 mm graft (Bentall procedure on 03/02/2010). 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  EXAM: Cardiac CT/Coronary CTA  TECHNIQUE: The patient was scanned on a Siemens 765 slice scanner. Axial gated 4.65 mm slices were carried out through the heart at 70 % to obtain coronary vessels. A retrospective acquisition in every 10% of the R-R interval were performed to acquire functional image series. The data set was analyzed on a dedicated [employer] work station.  FINDINGS: Non-cardiac: No significant non cardiac findings on limited lung and soft tissue windows. See separate report from [facility] Radiology.  S/p AVR with 23 mm bioprosthetic valve and aortic root replacement with 28 mm graft. Re-implanted coronaries.  There is a large multi-lobulated abscess that is encroaching 270 degrees of the aortic root located antero-bilaterally (sparing posterior portion of the aortic root). The abscess is located around right and left coronary cusp with the largest portion around the right coronary cusp. The abscess measures 58 mm (antero-posteriorly), x 45 mm (superio-inferiorly) x 21 mm (left to right). There is are two entry points - at the right coronary cusp and a large open connection at the right lateral LVOT. The abscess cavity  enlarges significantly during systole with dynamic systolic obstruction of the proximal RCA. The leaflets of the bioprosthetic valve open well.  Ascending Aorta: There is no evidence of infection around the ascending aortic graft.  Bioprosthetic valve diameter:  32 mm  Sinuses of Valsalva:  35 mm  Sinotubular junction:  20 mm  Ascending aorta:  29 x 32 mm  Distal graft ending the at the distal portion of the ascending aorta.  Proximal aortic arch (post graft) is dilated measuring 41 x 40 mm.  Distal aortic arch:  31 mm  Descending thoracic aorta:  30 mm  Patent origin of the brachiocephalic arteries.  Coronary arteries:  Right dominance.  Left main: Gives rise to LAD and LCX. No significant plague is seen in LM.  LAD: Large vessel that wraps around the apex and gives rise to 2 diagonal branches. There is no significant disease in the LAD territory.  LCX: Is a moderate caliber vessel that gives rise to a large obtuse marginal branch. OM1 further sub-branches. There is no significant atherosclerotic plague in the LCX territory.  RCA: Large caliber vessel that gives rise to PDA and PLVB, mild noncalcified plague in the mid RCA with associated 0-25% stenosis.  Other findings: Central line in the SVC terminating in the right atrium.  IMPRESSION: 1. There is a large paravalvular abscess around the root of the prosthetic aortic valve measuring 58 x 45 x 21 mm located antero- bilaterally (sparing posterior portion of the aortic root) with dynamic obstruction of RCA in systole.  2. Dilated proximal portion of the aortic arch (post graft) measuring 41 x 40 mm.  3. Normal coronaries.  Right dominance.  Bradley [name]   Electronically Signed   By: Bradley [name]   On: 10/26/2013 15:36    Positive for anxiety, bad mood, depression and sleep disturbance Blood pressure 137/83, pulse 85, temperature 98.3 F (36.8 C), temperature source Oral, resp. rate 18, height 6' 1.5" (1.867 m), weight 83.1 kg (183 lb 3.2 oz), SpO2  95.00%.   Assessment/Plan: Major depressive disorder, recurrent Adjustment disorder with mixed symptoms of anxiety and mood  Recommendation: Recommended to discontinue  trazodone which may because of vivid dreams We'll start Remeron M-tab 15 mg at bedtime for depression and insomnia  Continue Sertraline 50 mg for depression and Klonopin 1 mg PO TID for anxiety Appreciate psychiatric consultation Followup as clinically required   Teyanna Thielman,JANARDHAHA R. 10/27/2013, 12:42 PM

## 2013-10-27 NOTE — Progress Notes (Signed)
[facility] for Infectious Disease  Date of Admission:  10/22/2013  Antibiotics: cefazolin  Subjective: Patient not in room  Objective: Temp:  [98.3 F (36.8 C)-99 F (37.2 C)] 98.3 F (36.8 C) (03/30 0533) Pulse Rate:  [77-85] 85 (03/30 0533) Resp:  [16-18] 18 (03/30 0533) BP: (110-137)/(68-83) 137/83 mmHg (03/30 0533) SpO2:  [95 %-98 %] 95 % (03/30 0533) Weight:  [183 lb 3.2 oz (83.1 kg)] 183 lb 3.2 oz (83.1 kg) (03/30 0318)    Lab Results Lab Results  Component Value Date   WBC 13.8* 10/26/2013   HGB 10.0* 10/26/2013   HCT 30.9* 10/26/2013   MCV 92.2 10/26/2013   PLT 249 10/26/2013    Lab Results  Component Value Date   CREATININE 2.42* 10/26/2013   BUN 5* 10/26/2013   NA 136* 10/26/2013   K 4.5 10/26/2013   CL 97 10/26/2013   CO2 27 10/26/2013    Lab Results  Component Value Date   ALT 16 09/25/2013   AST 22 09/25/2013   ALKPHOS 50 09/25/2013   BILITOT 0.6 09/25/2013      Microbiology: Recent Results (from the past 240 hour(s))  CLOSTRIDIUM DIFFICILE BY PCR     Status: None   Collection Time    10/22/13 10:04 PM      Result Value Ref Range Status   C difficile by pcr NEGATIVE  NEGATIVE Final  CULTURE, BLOOD (ROUTINE X 2)     Status: None   Collection Time    10/22/13 10:26 PM      Result Value Ref Range Status   Specimen Description BLOOD RIGHT ARM   Final   Special Requests BOTTLES DRAWN AEROBIC ONLY 3CC   Final   Culture  Setup Time     Final   Value: 10/23/2013 04:42     Performed at [facility]   Culture     Final   Value:        BLOOD CULTURE RECEIVED NO GROWTH TO DATE CULTURE WILL BE HELD FOR 5 DAYS BEFORE ISSUING A FINAL NEGATIVE REPORT     Performed at [facility]   Report Status PENDING   Incomplete  CULTURE, BLOOD (ROUTINE X 2)     Status: None   Collection Time    10/22/13 10:34 PM      Result Value Ref Range Status   Specimen Description BLOOD RIGHT HAND   Final   Special Requests BOTTLES DRAWN AEROBIC ONLY 6CC    Final   Culture  Setup Time     Final   Value: 10/23/2013 04:42     Performed at [facility]   Culture     Final   Value:        BLOOD CULTURE RECEIVED NO GROWTH TO DATE CULTURE WILL BE HELD FOR 5 DAYS BEFORE ISSUING A FINAL NEGATIVE REPORT     Performed at [facility]   Report Status PENDING   Incomplete    Studies/Results: Ct Cardiac Morph/pulm Vein W/cm&w/o Ca Score  10/26/2013   CLINICAL DATA:  58 year old male with prior AVR with 23 mm bioprosthetic valve and aortic root replacement with 28 mm graft (Bentall procedure on 03/02/2010). 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  EXAM: Cardiac CT/Coronary CTA  TECHNIQUE: The patient was scanned on a Siemens 123456 slice scanner. Axial gated 123456 mm slices were carried out through the heart at 70 % to obtain coronary vessels. A retrospective acquisition in every 10%  of the R-R interval were performed to acquire functional image series. The data set was analyzed on a dedicated [employer] work station.  FINDINGS: Non-cardiac: No significant non cardiac findings on limited lung and soft tissue windows. See separate report from [facility] Radiology.  S/p AVR with 23 mm bioprosthetic valve and aortic root replacement with 28 mm graft. Re-implanted coronaries.  There is a large multi-lobulated abscess that is encroaching 270 degrees of the aortic root located antero-bilaterally (sparing posterior portion of the aortic root). The abscess is located around right and left coronary cusp with the largest portion around the right coronary cusp. The abscess measures 58 mm (antero-posteriorly), x 45 mm (superio-inferiorly) x 21 mm (left to right). There is are two entry points - at the right coronary cusp and a large open connection at the right lateral LVOT. The abscess cavity enlarges significantly during systole with dynamic systolic obstruction of the proximal RCA. The leaflets of the bioprosthetic valve open well.  Ascending  Aorta: There is no evidence of infection around the ascending aortic graft.  Bioprosthetic valve diameter:  32 mm  Sinuses of Valsalva:  35 mm  Sinotubular junction:  20 mm  Ascending aorta:  29 x 32 mm  Distal graft ending the at the distal portion of the ascending aorta.  Proximal aortic arch (post graft) is dilated measuring 41 x 40 mm.  Distal aortic arch:  31 mm  Descending thoracic aorta:  30 mm  Patent origin of the brachiocephalic arteries.  Coronary arteries:  Right dominance.  Left main: Gives rise to LAD and LCX. No significant plague is seen in LM.  LAD: Large vessel that wraps around the apex and gives rise to 2 diagonal branches. There is no significant disease in the LAD territory.  LCX: Is a moderate caliber vessel that gives rise to a large obtuse marginal branch. OM1 further sub-branches. There is no significant atherosclerotic plague in the LCX territory.  RCA: Large caliber vessel that gives rise to PDA and PLVB, mild noncalcified plague in the mid RCA with associated 0-25% stenosis.  Other findings: Central line in the SVC terminating in the right atrium.  IMPRESSION: 1. There is a large paravalvular abscess around the root of the prosthetic aortic valve measuring 58 x 45 x 21 mm located antero- bilaterally (sparing posterior portion of the aortic root) with dynamic obstruction of RCA in systole.  2. Dilated proximal portion of the aortic arch (post graft) measuring 41 x 40 mm.  3. Normal coronaries.  Right dominance.  Ena Dawley   Electronically Signed   By: Ena Dawley   On: 10/26/2013 15:36    Assessment/Plan: 1) paravalvular abscess with MSSA - on cefazolin.  Was on gentamicin but now with kidney failure, did not tolerate rifampin.   -if kidneys not expected to recover, could restart gentamicin? though always concern of ototoxicity in those with ESRD -I will try levaquin adjunctive therapy for now  Scharlene Gloss, [place] [facility] [place] [email or web address] [phone] pager   [phone] cell 10/27/2013, 1:52 PM

## 2013-10-27 NOTE — Progress Notes (Signed)
Chaplain responded to spiritual care consult. Patient very distressed over health changes and major life transition. He said he's been having trouble sleeping and has been dissatisfied with medical team, feeling "unheard" and uncared for. He expressed gratitude to his RN today, Denyse Amass, for sitting down and listening to him this morning and "taking action" in response to his concerns. He is very anxious about surgery and the diagnosis for dialysis for the rest of his life, feeling it "isn't worth it" but "he doesn't want to die." He says he is "angry, bitter, sad, devastated, and scared." He worries about being a burden for his family, for whom he wants to appear strong. He is angry at God, blaming God for being sick, and feeling that as a "good person" he shouldn't be sick. He said he has no hope and nothing worth living for, and doesn't want to the low quality of life that comes with having dialysis three days a week. Chaplain listening compassionately and empathically to patient's concerns, providing emotional and spiritual support. Patient asked chaplain to pray "for a better world." He was grateful for support. Will follow up as able, but please page if needed.   Ethelene Browns [phone]

## 2013-10-27 NOTE — Progress Notes (Signed)
Patient called RN into room stating that his leg is going numb. RN asked patient how long it had felt that way and patient stated all night. RN assessed patient's right leg (the numb leg)- skin was warm to touch, +2 pulse, skin color is a bit pale to the leg. RN is contacting NP on call. Will continue to monitor and wait for any further orders.

## 2013-10-28 ENCOUNTER — Inpatient Hospital Stay (HOSPITAL_COMMUNITY): Payer: Medicaid Other

## 2013-10-28 ENCOUNTER — Encounter (HOSPITAL_COMMUNITY): Payer: Medicaid Other | Admitting: Anesthesiology

## 2013-10-28 ENCOUNTER — Other Ambulatory Visit: Payer: Self-pay

## 2013-10-28 ENCOUNTER — Inpatient Hospital Stay (HOSPITAL_COMMUNITY): Payer: Medicaid Other | Admitting: Anesthesiology

## 2013-10-28 ENCOUNTER — Encounter (HOSPITAL_COMMUNITY)
Admission: AD | Disposition: A | Discharge status: Home Health | Payer: Medicaid Other | Source: Ambulatory Visit | Attending: Cardiothoracic Surgery

## 2013-10-28 ENCOUNTER — Encounter (HOSPITAL_COMMUNITY): Payer: Self-pay | Admitting: Anesthesiology

## 2013-10-28 DIAGNOSIS — I5189 Other ill-defined heart diseases: Secondary | ICD-10-CM

## 2013-10-28 HISTORY — PX: INTRAOPERATIVE TRANSESOPHAGEAL ECHOCARDIOGRAM: SHX5062

## 2013-10-28 HISTORY — PX: BENTALL PROCEDURE: SHX5058

## 2013-10-28 LAB — POCT I-STAT 4, (NA,K, GLUC, HGB,HCT)
Glucose, Bld: 116 mg/dL — ABNORMAL HIGH (ref 70–99)
Glucose, Bld: 119 mg/dL — ABNORMAL HIGH (ref 70–99)
Glucose, Bld: 112 mg/dL — ABNORMAL HIGH (ref 70–99)
Glucose, Bld: 135 mg/dL — ABNORMAL HIGH (ref 70–99)
Glucose, Bld: 111 mg/dL — ABNORMAL HIGH (ref 70–99)
Glucose, Bld: 131 mg/dL — ABNORMAL HIGH (ref 70–99)
Glucose, Bld: 147 mg/dL — ABNORMAL HIGH (ref 70–99)
Glucose, Bld: 151 mg/dL — ABNORMAL HIGH (ref 70–99)
Glucose, Bld: 134 mg/dL — ABNORMAL HIGH (ref 70–99)
Glucose, Bld: 299 mg/dL — ABNORMAL HIGH (ref 70–99)
Glucose, Bld: 339 mg/dL — ABNORMAL HIGH (ref 70–99)
Glucose, Bld: 354 mg/dL — ABNORMAL HIGH (ref 70–99)
Glucose, Bld: 242 mg/dL — ABNORMAL HIGH (ref 70–99)
Glucose, Bld: 291 mg/dL — ABNORMAL HIGH (ref 70–99)
Glucose, Bld: 125 mg/dL — ABNORMAL HIGH (ref 70–99)
HCT: 29 % — ABNORMAL LOW (ref 39.0–52.0)
HCT: 27 % — ABNORMAL LOW (ref 39.0–52.0)
HCT: 26 % — ABNORMAL LOW (ref 39.0–52.0)
HCT: 24 % — ABNORMAL LOW (ref 39.0–52.0)
HCT: 23 % — ABNORMAL LOW (ref 39.0–52.0)
HCT: 25 % — ABNORMAL LOW (ref 39.0–52.0)
HCT: 26 % — ABNORMAL LOW (ref 39.0–52.0)
HCT: 24 % — ABNORMAL LOW (ref 39.0–52.0)
HCT: 24 % — ABNORMAL LOW (ref 39.0–52.0)
HCT: 21 % — ABNORMAL LOW (ref 39.0–52.0)
HCT: 23 % — ABNORMAL LOW (ref 39.0–52.0)
HCT: 24 % — ABNORMAL LOW (ref 39.0–52.0)
HCT: 26 % — ABNORMAL LOW (ref 39.0–52.0)
HCT: 27 % — ABNORMAL LOW (ref 39.0–52.0)
HCT: 33 % — ABNORMAL LOW (ref 39.0–52.0)
Hemoglobin: 9.9 g/dL — ABNORMAL LOW (ref 13.0–17.0)
Hemoglobin: 9.2 g/dL — ABNORMAL LOW (ref 13.0–17.0)
Hemoglobin: 8.8 g/dL — ABNORMAL LOW (ref 13.0–17.0)
Hemoglobin: 8.2 g/dL — ABNORMAL LOW (ref 13.0–17.0)
Hemoglobin: 7.8 g/dL — ABNORMAL LOW (ref 13.0–17.0)
Hemoglobin: 8.5 g/dL — ABNORMAL LOW (ref 13.0–17.0)
Hemoglobin: 8.8 g/dL — ABNORMAL LOW (ref 13.0–17.0)
Hemoglobin: 8.2 g/dL — ABNORMAL LOW (ref 13.0–17.0)
Hemoglobin: 8.2 g/dL — ABNORMAL LOW (ref 13.0–17.0)
Hemoglobin: 7.1 g/dL — ABNORMAL LOW (ref 13.0–17.0)
Hemoglobin: 7.8 g/dL — ABNORMAL LOW (ref 13.0–17.0)
Hemoglobin: 8.2 g/dL — ABNORMAL LOW (ref 13.0–17.0)
Hemoglobin: 8.8 g/dL — ABNORMAL LOW (ref 13.0–17.0)
Hemoglobin: 9.2 g/dL — ABNORMAL LOW (ref 13.0–17.0)
Hemoglobin: 11.2 g/dL — ABNORMAL LOW (ref 13.0–17.0)
Potassium: 4 mEq/L (ref 3.7–5.3)
Potassium: 4.3 mEq/L (ref 3.7–5.3)
Potassium: 4.5 mEq/L (ref 3.7–5.3)
Potassium: 5.3 mEq/L (ref 3.7–5.3)
Potassium: 4.5 mEq/L (ref 3.7–5.3)
Potassium: 5.9 mEq/L — ABNORMAL HIGH (ref 3.7–5.3)
Potassium: 5.7 mEq/L — ABNORMAL HIGH (ref 3.7–5.3)
Potassium: 5.8 mEq/L — ABNORMAL HIGH (ref 3.7–5.3)
Potassium: 6.1 mEq/L — ABNORMAL HIGH (ref 3.7–5.3)
Potassium: 5.3 mEq/L (ref 3.7–5.3)
Potassium: 6.1 mEq/L — ABNORMAL HIGH (ref 3.7–5.3)
Potassium: 6.3 mEq/L — ABNORMAL HIGH (ref 3.7–5.3)
Potassium: 4.8 mEq/L (ref 3.7–5.3)
Potassium: 5.1 mEq/L (ref 3.7–5.3)
Potassium: 4.3 mEq/L (ref 3.7–5.3)
Sodium: 134 mEq/L — ABNORMAL LOW (ref 137–147)
Sodium: 134 mEq/L — ABNORMAL LOW (ref 137–147)
Sodium: 135 mEq/L — ABNORMAL LOW (ref 137–147)
Sodium: 133 mEq/L — ABNORMAL LOW (ref 137–147)
Sodium: 132 mEq/L — ABNORMAL LOW (ref 137–147)
Sodium: 130 mEq/L — ABNORMAL LOW (ref 137–147)
Sodium: 131 mEq/L — ABNORMAL LOW (ref 137–147)
Sodium: 132 mEq/L — ABNORMAL LOW (ref 137–147)
Sodium: 133 mEq/L — ABNORMAL LOW (ref 137–147)
Sodium: 132 mEq/L — ABNORMAL LOW (ref 137–147)
Sodium: 133 mEq/L — ABNORMAL LOW (ref 137–147)
Sodium: 137 mEq/L (ref 137–147)
Sodium: 135 mEq/L — ABNORMAL LOW (ref 137–147)
Sodium: 136 mEq/L — ABNORMAL LOW (ref 137–147)
Sodium: 136 mEq/L — ABNORMAL LOW (ref 137–147)

## 2013-10-28 LAB — GRAM STAIN

## 2013-10-28 LAB — POCT I-STAT 3, ART BLOOD GAS (G3+)
Acid-Base Excess: 9 mmol/L — ABNORMAL HIGH (ref 0.0–2.0)
Acid-Base Excess: 5 mmol/L — ABNORMAL HIGH (ref 0.0–2.0)
Acid-Base Excess: 6 mmol/L — ABNORMAL HIGH (ref 0.0–2.0)
Bicarbonate: 33.1 mEq/L — ABNORMAL HIGH (ref 20.0–24.0)
Bicarbonate: 28.8 mEq/L — ABNORMAL HIGH (ref 20.0–24.0)
Bicarbonate: 30.6 mEq/L — ABNORMAL HIGH (ref 20.0–24.0)
O2 Saturation: 100 %
O2 Saturation: 100 %
O2 Saturation: 100 %
TCO2: 34 mmol/L (ref 0–100)
TCO2: 30 mmol/L (ref 0–100)
TCO2: 32 mmol/L (ref 0–100)
pCO2 arterial: 41.2 mmHg (ref 35.0–45.0)
pCO2 arterial: 38 mmHg (ref 35.0–45.0)
pCO2 arterial: 44.9 mmHg (ref 35.0–45.0)
pH, Arterial: 7.512 — ABNORMAL HIGH (ref 7.350–7.450)
pH, Arterial: 7.487 — ABNORMAL HIGH (ref 7.350–7.450)
pH, Arterial: 7.441 (ref 7.350–7.450)
pO2, Arterial: 404 mmHg — ABNORMAL HIGH (ref 80.0–100.0)
pO2, Arterial: 431 mmHg — ABNORMAL HIGH (ref 80.0–100.0)
pO2, Arterial: 346 mmHg — ABNORMAL HIGH (ref 80.0–100.0)

## 2013-10-28 LAB — HEMOGLOBIN AND HEMATOCRIT, BLOOD
HCT: 24.2 % — ABNORMAL LOW (ref 39.0–52.0)
Hemoglobin: 8.1 g/dL — ABNORMAL LOW (ref 13.0–17.0)

## 2013-10-28 LAB — POCT I-STAT 3, VENOUS BLOOD GAS (G3P V)
Bicarbonate: 26.2 mEq/L — ABNORMAL HIGH (ref 20.0–24.0)
O2 Saturation: 69 %
TCO2: 28 mmol/L (ref 0–100)
pCO2, Ven: 48.4 mmHg (ref 45.0–50.0)
pH, Ven: 7.341 — ABNORMAL HIGH (ref 7.250–7.300)
pO2, Ven: 39 mmHg (ref 30.0–45.0)

## 2013-10-28 LAB — CBC
HCT: 32.2 % — ABNORMAL LOW (ref 39.0–52.0)
Hemoglobin: 10.5 g/dL — ABNORMAL LOW (ref 13.0–17.0)
MCH: 28.4 pg (ref 26.0–34.0)
MCHC: 32.6 g/dL (ref 30.0–36.0)
MCV: 87 fL (ref 78.0–100.0)
Platelets: 121 10*3/uL — ABNORMAL LOW (ref 150–400)
RBC: 3.7 MIL/uL — ABNORMAL LOW (ref 4.22–5.81)
RDW: 20.3 % — ABNORMAL HIGH (ref 11.5–15.5)
WBC: 37.9 10*3/uL — ABNORMAL HIGH (ref 4.0–10.5)

## 2013-10-28 LAB — GLUCOSE, CAPILLARY
Glucose-Capillary: 166 mg/dL — ABNORMAL HIGH (ref 70–99)
Glucose-Capillary: 77 mg/dL (ref 70–99)

## 2013-10-28 LAB — PLATELET COUNT
Platelets: DECREASED 10*3/uL (ref 150–400)
Platelets: 40 10*3/uL — ABNORMAL LOW (ref 150–400)

## 2013-10-28 LAB — PREPARE RBC (CROSSMATCH)

## 2013-10-28 LAB — PROTIME-INR
INR: 1.44 (ref 0.00–1.49)
Prothrombin Time: 17.2 seconds — ABNORMAL HIGH (ref 11.6–15.2)

## 2013-10-28 LAB — APTT: aPTT: 54 seconds — ABNORMAL HIGH (ref 24–37)

## 2013-10-28 LAB — FIBRINOGEN: Fibrinogen: 135 mg/dL — ABNORMAL LOW (ref 204–475)

## 2013-10-28 LAB — HEMOGLOBIN A1C
Hgb A1c MFr Bld: 4.9 % (ref <5.7)
Mean Plasma Glucose: 94 mg/dL (ref <117)

## 2013-10-28 SURGERY — REDO STERNOTOMY
Anesthesia: General | Site: Chest

## 2013-10-28 MED ORDER — MIDAZOLAM HCL 10 MG/2ML IJ SOLN
INTRAMUSCULAR | Status: AC
  Filled 2013-10-28: qty 2

## 2013-10-28 MED ORDER — SODIUM CHLORIDE 0.9 % IV SOLN
INTRAVENOUS | Status: DC | PRN
  Administered 2013-10-28: 08:00:00 via INTRAVENOUS

## 2013-10-28 MED ORDER — LIDOCAINE HCL (CARDIAC) 20 MG/ML IV SOLN
INTRAVENOUS | Status: AC
  Filled 2013-10-28: qty 5

## 2013-10-28 MED ORDER — MORPHINE SULFATE 2 MG/ML IJ SOLN
2.0000 mg | INTRAMUSCULAR | Status: DC | PRN
  Administered 2013-10-28: 2 mg via INTRAVENOUS
  Filled 2013-10-28: qty 1

## 2013-10-28 MED ORDER — 0.9 % SODIUM CHLORIDE (POUR BTL) OPTIME
TOPICAL | Status: DC | PRN
  Administered 2013-10-28: 1000 mL

## 2013-10-28 MED ORDER — ACETAMINOPHEN 500 MG PO TABS
1000.0000 mg | ORAL_TABLET | Freq: Four times a day (QID) | ORAL | Status: DC
  Filled 2013-10-28 (×4): qty 2

## 2013-10-28 MED ORDER — FENTANYL CITRATE 0.05 MG/ML IJ SOLN
50.0000 ug | INTRAMUSCULAR | Status: DC | PRN
  Administered 2013-10-29 – 2013-10-30 (×4): 100 ug via INTRAVENOUS
  Filled 2013-10-28 (×4): qty 2

## 2013-10-28 MED ORDER — LACTATED RINGERS IV SOLN: INTRAVENOUS | Status: DC | PRN

## 2013-10-28 MED ORDER — HEMOSTATIC AGENTS (NO CHARGE) OPTIME
TOPICAL | Status: DC | PRN
  Administered 2013-10-28: 1 via TOPICAL

## 2013-10-28 MED ORDER — ACETAMINOPHEN 160 MG/5ML PO SOLN
1000.0000 mg | Freq: Four times a day (QID) | ORAL | Status: DC
  Administered 2013-10-29 (×3): 1000 mg
  Filled 2013-10-28 (×3): qty 40.6

## 2013-10-28 MED ORDER — NOREPINEPHRINE BITARTRATE 1 MG/ML IJ SOLN
0.0000 ug/min | INTRAVENOUS | Status: DC
  Administered 2013-10-29: 5 ug/min via INTRAVENOUS
  Filled 2013-10-28 (×2): qty 8

## 2013-10-28 MED ORDER — DEXTROSE 50 % IV SOLN
INTRAVENOUS | Status: AC
  Filled 2013-10-28: qty 50

## 2013-10-28 MED ORDER — HEPARIN SODIUM (PORCINE) 1000 UNIT/ML IJ SOLN
INTRAMUSCULAR | Status: DC | PRN
  Administered 2013-10-28: 41000 [IU] via INTRAVENOUS

## 2013-10-28 MED ORDER — LACTATED RINGERS IV SOLN: 500.0000 mL | Freq: Once | INTRAVENOUS | Status: AC | PRN

## 2013-10-28 MED ORDER — SODIUM CHLORIDE 0.9 % IJ SOLN
3.0000 mL | Freq: Two times a day (BID) | INTRAMUSCULAR | Status: DC
  Administered 2013-10-29 – 2013-11-06 (×13): 3 mL via INTRAVENOUS

## 2013-10-28 MED ORDER — HEPARIN SODIUM (PORCINE) 1000 UNIT/ML IJ SOLN
INTRAMUSCULAR | Status: AC
  Filled 2013-10-28: qty 1

## 2013-10-28 MED ORDER — METOPROLOL TARTRATE 25 MG/10 ML ORAL SUSPENSION
12.5000 mg | Freq: Two times a day (BID) | ORAL | Status: DC
  Filled 2013-10-28 (×15): qty 5

## 2013-10-28 MED ORDER — PROTAMINE SULFATE 10 MG/ML IV SOLN
INTRAVENOUS | Status: AC
  Filled 2013-10-28: qty 25

## 2013-10-28 MED ORDER — PANTOPRAZOLE SODIUM 40 MG PO TBEC
40.0000 mg | DELAYED_RELEASE_TABLET | Freq: Every day | ORAL | Status: DC
  Administered 2013-10-31 – 2013-11-06 (×7): 40 mg via ORAL
  Filled 2013-10-28 (×7): qty 1

## 2013-10-28 MED ORDER — AMIODARONE HCL IN DEXTROSE 360-4.14 MG/200ML-% IV SOLN
60.0000 mg/h | INTRAVENOUS | Status: DC
  Filled 2013-10-28 (×3): qty 200

## 2013-10-28 MED ORDER — METOPROLOL TARTRATE 1 MG/ML IV SOLN: 2.5000 mg | INTRAVENOUS | Status: DC | PRN

## 2013-10-28 MED ORDER — SODIUM CHLORIDE 0.9 % IV SOLN
INTRAVENOUS | Status: DC
  Administered 2013-10-29: 0.9 [IU]/h via INTRAVENOUS
  Filled 2013-10-28 (×2): qty 1

## 2013-10-28 MED ORDER — AMIODARONE HCL IN DEXTROSE 360-4.14 MG/200ML-% IV SOLN
60.0000 mg/h | INTRAVENOUS | Status: DC
  Filled 2013-10-28: qty 200

## 2013-10-28 MED ORDER — ROCURONIUM BROMIDE 50 MG/5ML IV SOLN
INTRAVENOUS | Status: AC
  Filled 2013-10-28: qty 1

## 2013-10-28 MED ORDER — PHENYLEPHRINE 40 MCG/ML (10ML) SYRINGE FOR IV PUSH (FOR BLOOD PRESSURE SUPPORT)
PREFILLED_SYRINGE | INTRAVENOUS | Status: AC
  Filled 2013-10-28: qty 10

## 2013-10-28 MED ORDER — AMIODARONE HCL IN DEXTROSE 360-4.14 MG/200ML-% IV SOLN
30.0000 mg/h | INTRAVENOUS | Status: DC
  Filled 2013-10-28: qty 200

## 2013-10-28 MED ORDER — SODIUM CHLORIDE 0.9 % IV SOLN: 250.0000 mL | INTRAVENOUS | Status: DC

## 2013-10-28 MED ORDER — VANCOMYCIN HCL IN DEXTROSE 1-5 GM/200ML-% IV SOLN
1000.0000 mg | Freq: Once | INTRAVENOUS | Status: AC
  Administered 2013-10-29: 1000 mg via INTRAVENOUS
  Filled 2013-10-28: qty 200

## 2013-10-28 MED ORDER — BISACODYL 10 MG RE SUPP: 10.0000 mg | Freq: Every day | RECTAL | Status: DC

## 2013-10-28 MED ORDER — NITROGLYCERIN IN D5W 200-5 MCG/ML-% IV SOLN: 0.0000 ug/min | INTRAVENOUS | Status: DC

## 2013-10-28 MED ORDER — LACTATED RINGERS IV SOLN
INTRAVENOUS | Status: DC | PRN
  Administered 2013-10-28: 07:00:00 via INTRAVENOUS

## 2013-10-28 MED ORDER — MIDAZOLAM HCL 2 MG/2ML IJ SOLN
INTRAMUSCULAR | Status: AC
  Filled 2013-10-28: qty 2

## 2013-10-28 MED ORDER — DOPAMINE-DEXTROSE 3.2-5 MG/ML-% IV SOLN
0.0000 ug/kg/min | INTRAVENOUS | Status: DC
  Administered 2013-10-28 – 2013-10-31 (×3): 5 ug/kg/min via INTRAVENOUS
  Filled 2013-10-28 (×2): qty 250

## 2013-10-28 MED ORDER — DEXMEDETOMIDINE HCL IN NACL 200 MCG/50ML IV SOLN
INTRAVENOUS | Status: AC
  Filled 2013-10-28: qty 50

## 2013-10-28 MED ORDER — NOREPINEPHRINE BITARTRATE 1 MG/ML IJ SOLN
4000.0000 ug | INTRAVENOUS | Status: DC | PRN
  Administered 2013-10-28: 2 ug/min via INTRAVENOUS

## 2013-10-28 MED ORDER — FENTANYL CITRATE 0.05 MG/ML IJ SOLN
INTRAMUSCULAR | Status: AC
Start: 2013-10-28 — End: 2013-10-28
  Filled 2013-10-28: qty 5

## 2013-10-28 MED ORDER — PROPOFOL 10 MG/ML IV BOLUS
INTRAVENOUS | Status: DC | PRN
  Administered 2013-10-28: 70 mg via INTRAVENOUS

## 2013-10-28 MED ORDER — NOREPINEPHRINE BITARTRATE 1 MG/ML IJ SOLN
2.0000 ug/min | INTRAVENOUS | Status: DC
  Filled 2013-10-28: qty 8

## 2013-10-28 MED ORDER — FENTANYL CITRATE 0.05 MG/ML IJ SOLN
INTRAMUSCULAR | Status: AC
  Filled 2013-10-28: qty 5

## 2013-10-28 MED ORDER — ACETAMINOPHEN 160 MG/5ML PO SOLN: 650.0000 mg | Freq: Once | ORAL | Status: DC

## 2013-10-28 MED ORDER — FAMOTIDINE IN NACL 20-0.9 MG/50ML-% IV SOLN
20.0000 mg | Freq: Two times a day (BID) | INTRAVENOUS | Status: AC
  Administered 2013-10-28 – 2013-10-29 (×2): 20 mg via INTRAVENOUS
  Filled 2013-10-28: qty 50

## 2013-10-28 MED ORDER — CALCIUM CHLORIDE 10 % IV SOLN
INTRAVENOUS | Status: DC | PRN
  Administered 2013-10-28: 1 g via INTRAVENOUS

## 2013-10-28 MED ORDER — BISACODYL 5 MG PO TBEC
10.0000 mg | DELAYED_RELEASE_TABLET | Freq: Every day | ORAL | Status: DC
  Administered 2013-10-31: 10 mg via ORAL
  Filled 2013-10-28: qty 2

## 2013-10-28 MED ORDER — PHENYLEPHRINE HCL 10 MG/ML IJ SOLN
INTRAMUSCULAR | Status: AC
  Filled 2013-10-28: qty 2

## 2013-10-28 MED ORDER — SODIUM CHLORIDE 0.45 % IV SOLN
INTRAVENOUS | Status: DC
  Administered 2013-10-28: 22:00:00 via INTRAVENOUS

## 2013-10-28 MED ORDER — AMIODARONE LOAD VIA INFUSION
150.0000 mg | Freq: Once | INTRAVENOUS | Status: DC
  Filled 2013-10-28: qty 83.34

## 2013-10-28 MED ORDER — SODIUM CHLORIDE 0.9 % IJ SOLN
INTRAMUSCULAR | Status: AC
  Filled 2013-10-28: qty 20

## 2013-10-28 MED ORDER — VECURONIUM BROMIDE 10 MG IV SOLR
INTRAVENOUS | Status: AC
  Filled 2013-10-28: qty 20

## 2013-10-28 MED ORDER — AMIODARONE HCL IN DEXTROSE 360-4.14 MG/200ML-% IV SOLN
INTRAVENOUS | Status: DC | PRN
  Administered 2013-10-28: 150 mg/h via INTRAVENOUS

## 2013-10-28 MED ORDER — EPHEDRINE SULFATE 50 MG/ML IJ SOLN
INTRAMUSCULAR | Status: AC
  Filled 2013-10-28: qty 1

## 2013-10-28 MED ORDER — PROTAMINE SULFATE 10 MG/ML IV SOLN
INTRAVENOUS | Status: DC | PRN
  Administered 2013-10-28: 290 mg via INTRAVENOUS

## 2013-10-28 MED ORDER — INSULIN REGULAR BOLUS VIA INFUSION
0.0000 [IU] | Freq: Three times a day (TID) | INTRAVENOUS | Status: DC
  Filled 2013-10-28: qty 10

## 2013-10-28 MED ORDER — ASPIRIN EC 325 MG PO TBEC
325.0000 mg | DELAYED_RELEASE_TABLET | Freq: Every day | ORAL | Status: DC
  Administered 2013-10-31 – 2013-11-01 (×2): 325 mg via ORAL
  Filled 2013-10-28 (×5): qty 1

## 2013-10-28 MED ORDER — COAGULATION FACTOR VIIA RECOMB 1 MG IV SOLR
90.0000 ug/kg | Freq: Once | INTRAVENOUS | Status: AC
  Administered 2013-10-28: 7 mg via INTRAVENOUS
  Filled 2013-10-28: qty 7

## 2013-10-28 MED ORDER — MAGNESIUM SULFATE 4000MG/100ML IJ SOLN: 4.0000 g | Freq: Once | INTRAMUSCULAR | Status: DC

## 2013-10-28 MED ORDER — DEXTROSE 5 % IV SOLN
1.5000 g | Freq: Two times a day (BID) | INTRAVENOUS | Status: DC
  Administered 2013-10-29: 1.5 g via INTRAVENOUS
  Filled 2013-10-28 (×2): qty 1.5

## 2013-10-28 MED ORDER — PROTAMINE SULFATE 10 MG/ML IV SOLN
INTRAVENOUS | Status: AC
  Filled 2013-10-28: qty 5

## 2013-10-28 MED ORDER — MILRINONE IN DEXTROSE 20 MG/100ML IV SOLN
0.1250 ug/kg/min | INTRAVENOUS | Status: DC
  Filled 2013-10-28: qty 100

## 2013-10-28 MED ORDER — STERILE WATER FOR INJECTION IJ SOLN
INTRAMUSCULAR | Status: AC
  Filled 2013-10-28: qty 20

## 2013-10-28 MED ORDER — ALBUMIN HUMAN 5 % IV SOLN
250.0000 mL | INTRAVENOUS | Status: AC | PRN
  Administered 2013-10-29: 250 mL via INTRAVENOUS

## 2013-10-28 MED ORDER — SODIUM CHLORIDE 0.9 % IV SOLN
INTRAVENOUS | Status: DC
  Administered 2013-10-28: 22:00:00 via INTRAVENOUS

## 2013-10-28 MED ORDER — DOPAMINE-DEXTROSE 1.6-5 MG/ML-% IV SOLN
INTRAVENOUS | Status: DC | PRN
  Administered 2013-10-28: 3 ug/kg/min via INTRAVENOUS

## 2013-10-28 MED ORDER — LIDOCAINE HCL (CARDIAC) 20 MG/ML IV SOLN
INTRAVENOUS | Status: DC | PRN
  Administered 2013-10-28: 50 mg via INTRAVENOUS

## 2013-10-28 MED ORDER — POTASSIUM CHLORIDE 10 MEQ/50ML IV SOLN: 10.0000 meq | INTRAVENOUS | Status: DC

## 2013-10-28 MED ORDER — DEXMEDETOMIDINE HCL IN NACL 200 MCG/50ML IV SOLN
0.1000 ug/kg/h | INTRAVENOUS | Status: DC
  Administered 2013-10-29: 0.6 ug/kg/h via INTRAVENOUS
  Administered 2013-10-29: 0.7 ug/kg/h via INTRAVENOUS
  Administered 2013-10-29: 0.6 ug/kg/h via INTRAVENOUS
  Administered 2013-10-29: 0.2 ug/kg/h via INTRAVENOUS
  Administered 2013-10-30 – 2013-10-31 (×7): 0.7 ug/kg/h via INTRAVENOUS
  Filled 2013-10-28 (×12): qty 50

## 2013-10-28 MED ORDER — LORAZEPAM 2 MG/ML IJ SOLN: 1.0000 mg | Freq: Once | INTRAMUSCULAR | Status: DC | PRN

## 2013-10-28 MED ORDER — ONDANSETRON HCL 4 MG/2ML IJ SOLN
4.0000 mg | Freq: Four times a day (QID) | INTRAMUSCULAR | Status: DC | PRN
  Administered 2013-10-31 – 2013-11-06 (×7): 4 mg via INTRAVENOUS
  Filled 2013-10-28 (×7): qty 2

## 2013-10-28 MED ORDER — AMIODARONE HCL IN DEXTROSE 360-4.14 MG/200ML-% IV SOLN
30.0000 mg/h | INTRAVENOUS | Status: DC
Start: 2013-10-28 — End: 2013-10-31
  Administered 2013-10-28 – 2013-10-31 (×5): 30 mg/h via INTRAVENOUS
  Filled 2013-10-28 (×12): qty 200

## 2013-10-28 MED ORDER — DEXTROSE 5 % IV SOLN
0.0000 ug/min | INTRAVENOUS | Status: DC
  Administered 2013-10-29 (×2): 35 ug/min via INTRAVENOUS
  Administered 2013-10-30: 40 ug/min via INTRAVENOUS
  Administered 2013-10-30: 5 ug/min via INTRAVENOUS
  Administered 2013-10-31: 50 ug/min via INTRAVENOUS
  Administered 2013-10-31: 40 ug/min via INTRAVENOUS
  Filled 2013-10-28 (×9): qty 2

## 2013-10-28 MED ORDER — ASPIRIN 81 MG PO CHEW
324.0000 mg | CHEWABLE_TABLET | Freq: Every day | ORAL | Status: DC
  Administered 2013-10-29: 324 mg
  Filled 2013-10-28: qty 4

## 2013-10-28 MED ORDER — SODIUM CHLORIDE 0.9 % IJ SOLN
OROMUCOSAL | Status: DC | PRN
  Administered 2013-10-28 (×3): via TOPICAL
  Administered 2013-10-28: 08:00:00

## 2013-10-28 MED ORDER — ALBUMIN HUMAN 5 % IV SOLN
INTRAVENOUS | Status: DC | PRN
  Administered 2013-10-28 (×3): via INTRAVENOUS

## 2013-10-28 MED ORDER — SUCCINYLCHOLINE CHLORIDE 20 MG/ML IJ SOLN
INTRAMUSCULAR | Status: AC
  Filled 2013-10-28: qty 1

## 2013-10-28 MED ORDER — ROCURONIUM BROMIDE 100 MG/10ML IV SOLN
INTRAVENOUS | Status: DC | PRN
  Administered 2013-10-28 (×2): 50 mg via INTRAVENOUS

## 2013-10-28 MED ORDER — FENTANYL CITRATE 0.05 MG/ML IJ SOLN
INTRAMUSCULAR | Status: DC | PRN
  Administered 2013-10-28: 250 ug via INTRAVENOUS
  Administered 2013-10-28: 500 ug via INTRAVENOUS
  Administered 2013-10-28 (×2): 250 ug via INTRAVENOUS
  Administered 2013-10-28: 25 ug via INTRAVENOUS
  Administered 2013-10-28: 225 ug via INTRAVENOUS
  Administered 2013-10-28: 250 ug via INTRAVENOUS

## 2013-10-28 MED ORDER — MIDAZOLAM HCL 2 MG/2ML IJ SOLN
2.0000 mg | INTRAMUSCULAR | Status: DC | PRN
  Administered 2013-10-29 – 2013-10-30 (×6): 2 mg via INTRAVENOUS
  Filled 2013-10-28 (×6): qty 2

## 2013-10-28 MED ORDER — METOPROLOL TARTRATE 12.5 MG HALF TABLET
12.5000 mg | ORAL_TABLET | Freq: Two times a day (BID) | ORAL | Status: DC
  Administered 2013-10-31 – 2013-11-02 (×4): 12.5 mg via ORAL
  Filled 2013-10-28 (×15): qty 1

## 2013-10-28 MED ORDER — DOCUSATE SODIUM 100 MG PO CAPS
200.0000 mg | ORAL_CAPSULE | Freq: Every day | ORAL | Status: DC
  Administered 2013-10-31: 200 mg via ORAL
  Filled 2013-10-28: qty 2

## 2013-10-28 MED ORDER — PROPOFOL 10 MG/ML IV BOLUS
INTRAVENOUS | Status: AC
  Filled 2013-10-28: qty 20

## 2013-10-28 MED ORDER — SODIUM CHLORIDE 0.9 % IJ SOLN
3.0000 mL | INTRAMUSCULAR | Status: DC | PRN
  Administered 2013-10-29: 3 mL via INTRAVENOUS
  Administered 2013-10-29: 10 mL via INTRAVENOUS

## 2013-10-28 MED ORDER — ACETAMINOPHEN 650 MG RE SUPP: 650.0000 mg | Freq: Once | RECTAL | Status: DC

## 2013-10-28 MED ORDER — LACTATED RINGERS IV SOLN
INTRAVENOUS | Status: DC
  Administered 2013-10-28: 22:00:00 via INTRAVENOUS

## 2013-10-28 MED ORDER — LORAZEPAM 2 MG/ML IJ SOLN: 0.5000 mg | Freq: Once | INTRAMUSCULAR | Status: DC | PRN

## 2013-10-28 MED ORDER — MIDAZOLAM HCL 5 MG/5ML IJ SOLN
INTRAMUSCULAR | Status: DC | PRN
  Administered 2013-10-28 (×3): 4 mg via INTRAVENOUS
  Administered 2013-10-28: 2 mg via INTRAVENOUS
  Administered 2013-10-28: 4 mg via INTRAVENOUS

## 2013-10-28 MED ORDER — DEXTROSE 5 % IV SOLN
INTRAVENOUS | Status: DC | PRN
  Administered 2013-10-28: 09:00:00 via INTRAVENOUS

## 2013-10-28 MED ORDER — VECURONIUM BROMIDE 10 MG IV SOLR
INTRAVENOUS | Status: DC | PRN
  Administered 2013-10-28: 10 mg via INTRAVENOUS
  Administered 2013-10-28: 6 mg via INTRAVENOUS
  Administered 2013-10-28 (×3): 10 mg via INTRAVENOUS
  Administered 2013-10-28: 4 mg via INTRAVENOUS
  Administered 2013-10-28: 10 mg via INTRAVENOUS

## 2013-10-28 MED ORDER — AMINOCAPROIC ACID 250 MG/ML IV SOLN
1.0000 g/h | INTRAVENOUS | Status: DC
  Filled 2013-10-28 (×4): qty 20

## 2013-10-28 MED ORDER — TRAMADOL HCL 50 MG PO TABS: 50.0000 mg | ORAL_TABLET | ORAL | Status: DC | PRN

## 2013-10-28 SURGICAL SUPPLY — 127 items
ADAPTER CARDIO PERF ANTE/RETRO (ADAPTER) ×3 IMPLANT
ANTEGRADE CPLG (MISCELLANEOUS) ×6 IMPLANT
APPLICATOR COTTON TIP 6IN STRL (MISCELLANEOUS) IMPLANT
APPLICATOR TIP COSEAL (VASCULAR PRODUCTS) ×6 IMPLANT
ATTRACTOMAT 16X20 MAGNETIC DRP (DRAPES) ×3 IMPLANT
BAG DECANTER FOR FLEXI CONT (MISCELLANEOUS) ×3 IMPLANT
BLADE CORE FAN STRYKER (BLADE) ×3 IMPLANT
BLADE STERNUM SYSTEM 6 (BLADE) ×3 IMPLANT
BLADE SURG 11 STRL SS (BLADE) IMPLANT
BLADE SURG 15 STRL LF DISP TIS (BLADE) ×2 IMPLANT
BLADE SURG 15 STRL SS (BLADE) ×1
BOOT SUTURE AID YELLOW STND (SUTURE) ×3 IMPLANT
CANISTER SUCTION 2500CC (MISCELLANEOUS) ×3 IMPLANT
CANNULA GUNDRY RCSP 15FR (MISCELLANEOUS) ×3 IMPLANT
CANNULA OPTISITE PERFUSION 18F (CANNULA) ×3 IMPLANT
CANNULA VENOUS DUAL 32/40 (CANNULA) IMPLANT
CATH HEART VENT LEFT (CATHETERS) ×2 IMPLANT
CATH RETROPLEGIA CORONARY 14FR (CATHETERS) ×6 IMPLANT
CATH ROBINSON RED A/P 18FR (CATHETERS) ×6 IMPLANT
CATH/SQUID NICHOLS JEHLE COR (CATHETERS) ×3 IMPLANT
CAUTERY EYE LOW TEMP 1300F FIN (OPHTHALMIC RELATED) ×3 IMPLANT
CLIP FOGARTY SPRING 6M (CLIP) IMPLANT
CONT SPEC 4OZ CLIKSEAL STRL BL (MISCELLANEOUS) ×12 IMPLANT
COVER SURGICAL LIGHT HANDLE (MISCELLANEOUS) ×6 IMPLANT
CRADLE DONUT ADULT HEAD (MISCELLANEOUS) ×3 IMPLANT
DERMABOND ADVANCED (GAUZE/BANDAGES/DRESSINGS) ×1
DERMABOND ADVANCED .7 DNX12 (GAUZE/BANDAGES/DRESSINGS) ×2 IMPLANT
DRAIN CHANNEL 28F RND 3/8 FF (WOUND CARE) ×3 IMPLANT
DRAIN CHANNEL 32F RND 10.7 FF (WOUND CARE) IMPLANT
DRAPE C-ARM 42X72 X-RAY (DRAPES) ×3 IMPLANT
DRAPE CARDIOVASCULAR INCISE (DRAPES) ×1
DRAPE INCISE IOBAN 66X45 STRL (DRAPES) ×3 IMPLANT
DRAPE SLUSH/WARMER DISC (DRAPES) IMPLANT
DRAPE SRG 135X102X78XABS (DRAPES) ×2 IMPLANT
DRSG AQUACEL AG ADV 3.5X14 (GAUZE/BANDAGES/DRESSINGS) ×3 IMPLANT
ELECT BLADE 4.0 EZ CLEAN MEGAD (MISCELLANEOUS) ×6
ELECT CAUTERY BLADE 6.4 (BLADE) IMPLANT
ELECT REM PT RETURN 9FT ADLT (ELECTROSURGICAL) ×6
ELECTRODE BLDE 4.0 EZ CLN MEGD (MISCELLANEOUS) ×4 IMPLANT
ELECTRODE REM PT RTRN 9FT ADLT (ELECTROSURGICAL) ×4 IMPLANT
FEMORAL VENOUS CANN RAP (CANNULA) ×3 IMPLANT
GLOVE BIO SURGEON STRL SZ 6 (GLOVE) ×33 IMPLANT
GLOVE BIO SURGEON STRL SZ 6.5 (GLOVE) ×18 IMPLANT
GLOVE BIO SURGEON STRL SZ7 (GLOVE) ×21 IMPLANT
GLOVE BIOGEL PI IND STRL 6.5 (GLOVE) ×6 IMPLANT
GLOVE BIOGEL PI IND STRL 7.0 (GLOVE) ×8 IMPLANT
GLOVE BIOGEL PI INDICATOR 6.5 (GLOVE) ×3
GLOVE BIOGEL PI INDICATOR 7.0 (GLOVE) ×4
GOWN STRL REUS W/ TWL LRG LVL3 (GOWN DISPOSABLE) ×16 IMPLANT
GOWN STRL REUS W/TWL LRG LVL3 (GOWN DISPOSABLE) ×8
HEMOSTAT POWDER SURGIFOAM 1G (HEMOSTASIS) ×12 IMPLANT
HEMOSTAT SURGICEL 2X14 (HEMOSTASIS) ×3 IMPLANT
HEMOSTAT SURGICEL 2X4 FIBR (HEMOSTASIS) ×3 IMPLANT
INSERT FOGARTY XLG (MISCELLANEOUS) ×3 IMPLANT
KIT BASIN OR (CUSTOM PROCEDURE TRAY) ×3 IMPLANT
KIT DILATOR VASC 18G NDL (KITS) ×3 IMPLANT
KIT ROOM TURNOVER OR (KITS) ×3 IMPLANT
KIT SUCTION CATH 14FR (SUCTIONS) ×3 IMPLANT
LEAD PACING MYOCARDI (MISCELLANEOUS) IMPLANT
NEEDLE 18GX1X1/2 (RX/OR ONLY) (NEEDLE) ×3 IMPLANT
NEEDLE PERC 18GX7CM (NEEDLE) ×3 IMPLANT
NS IRRIG 1000ML POUR BTL (IV SOLUTION) ×15 IMPLANT
PACK OPEN HEART (CUSTOM PROCEDURE TRAY) ×3 IMPLANT
PAD ARMBOARD 7.5X6 YLW CONV (MISCELLANEOUS) ×6 IMPLANT
PENCIL BUTTON HOLSTER BLD 10FT (ELECTRODE) ×3 IMPLANT
PLEDGET 1/4X1/8X1/16 (MISCELLANEOUS) ×8 IMPLANT
PLEDGETS 1/4X1/8X1/16 (MISCELLANEOUS) ×12
PUNCH AORTIC ROT 5.0MM RCL 50 (MISCELLANEOUS) ×3 IMPLANT
SEALANT SURG COSEAL 8ML (VASCULAR PRODUCTS) ×3 IMPLANT
SET CANNULATION TOURNIQUET (MISCELLANEOUS) ×3 IMPLANT
SPONGE GAUZE 4X4 12PLY (GAUZE/BANDAGES/DRESSINGS) ×6 IMPLANT
SPONGE GAUZE 4X4 12PLY STER LF (GAUZE/BANDAGES/DRESSINGS) ×6 IMPLANT
SPONGE LAP 18X18 X RAY DECT (DISPOSABLE) ×6 IMPLANT
STOPCOCK 4 WAY LG BORE MALE ST (IV SETS) ×3 IMPLANT
SUCKER INTRACARDIAC WEIGHTED (SUCKER) ×3 IMPLANT
SUT ETHIBON 2 0 V 52N 30 (SUTURE) ×6 IMPLANT
SUT ETHIBON EXCEL 2-0 V-5 (SUTURE) IMPLANT
SUT ETHIBOND 2 0 SH (SUTURE)
SUT ETHIBOND 2 0 SH 36X2 (SUTURE) IMPLANT
SUT ETHIBOND 2 0 V4 (SUTURE) IMPLANT
SUT ETHIBOND 2 0V4 GREEN (SUTURE) IMPLANT
SUT ETHIBOND 4 0 RB 1 (SUTURE) ×9 IMPLANT
SUT ETHIBOND NAB MH 2-0 36IN (SUTURE) ×3 IMPLANT
SUT ETHIBOND V-5 VALVE (SUTURE) IMPLANT
SUT GORETEX CV 4 TH 22 36 (SUTURE) ×6 IMPLANT
SUT GORETEX CV4 TH-18 (SUTURE) ×12 IMPLANT
SUT PROLENE 3 0 RB 1 (SUTURE) ×3 IMPLANT
SUT PROLENE 3 0 SH 1 (SUTURE) IMPLANT
SUT PROLENE 3 0 SH1 36 (SUTURE) ×9 IMPLANT
SUT PROLENE 4 0 RB 1 (SUTURE) ×13
SUT PROLENE 4-0 RB1 .5 CRCL 36 (SUTURE) ×26 IMPLANT
SUT PROLENE 5 0 C 1 36 (SUTURE) ×51 IMPLANT
SUT SILK  1 MH (SUTURE) ×5
SUT SILK 1 MH (SUTURE) ×10 IMPLANT
SUT SILK 1 TIES 10X30 (SUTURE) ×3 IMPLANT
SUT SILK 2 0 (SUTURE) ×1
SUT SILK 2 0 SH CR/8 (SUTURE) ×6 IMPLANT
SUT SILK 2 0SH CR/8 30 (SUTURE) ×3 IMPLANT
SUT SILK 2-0 18XBRD TIE 12 (SUTURE) ×2 IMPLANT
SUT SILK 3 0 SH CR/8 (SUTURE) ×3 IMPLANT
SUT SILK 4 0 (SUTURE) ×1
SUT SILK 4-0 18XBRD TIE 12 (SUTURE) ×2 IMPLANT
SUT STEEL 6MS V (SUTURE) ×3 IMPLANT
SUT TEM PAC WIRE 2 0 SH (SUTURE) ×6 IMPLANT
SUT VIC AB 1 CTX 18 (SUTURE) IMPLANT
SUT VIC AB 2-0 CT1 27 (SUTURE) ×1
SUT VIC AB 2-0 CT1 TAPERPNT 27 (SUTURE) ×2 IMPLANT
SUT VIC AB 2-0 CTX 27 (SUTURE) IMPLANT
SUT VIC AB 3-0 X1 27 (SUTURE) ×3 IMPLANT
SWAB CULTURE LIQUID MINI MALE (MISCELLANEOUS) ×3 IMPLANT
SYSTEM SAHARA CHEST DRAIN ATS (WOUND CARE) ×3 IMPLANT
TAPE CLOTH SURG 4X10 WHT LF (GAUZE/BANDAGES/DRESSINGS) ×6 IMPLANT
TOWEL OR 17X24 6PK STRL BLUE (TOWEL DISPOSABLE) ×3 IMPLANT
TOWEL OR 17X26 10 PK STRL BLUE (TOWEL DISPOSABLE) ×3 IMPLANT
TRAY CATH LUMEN 1 20CM STRL (SET/KITS/TRAYS/PACK) ×3 IMPLANT
TRAY FOLEY IC TEMP SENS 14FR (CATHETERS) ×6 IMPLANT
TRAY FOLEY IC TEMP SENS 16FR (CATHETERS) ×3 IMPLANT
TUBE ANAEROBIC SPECIMEN COL (MISCELLANEOUS) ×3 IMPLANT
TUBE CONNECTING 12X1/4 (SUCTIONS) ×3 IMPLANT
TUBE FEEDING 8FR 16IN STR KANG (MISCELLANEOUS) ×3 IMPLANT
TUBING ART PRESS 48 MALE/FEM (TUBING) ×6 IMPLANT
UNDERPAD 30X30 INCONTINENT (UNDERPADS AND DIAPERS) ×3 IMPLANT
VALVE HEART CRYO AORTIC 23 (Prosthesis & Implant Heart) ×2 IMPLANT
VALVE HEART CRYO AORTIC 23MM (Prosthesis & Implant Heart) ×1 IMPLANT
VENT LEFT HEART 12002 (CATHETERS) ×3
WATER STERILE IRR 1000ML POUR (IV SOLUTION) ×6 IMPLANT
YANKAUER SUCT BULB TIP NO VENT (SUCTIONS) ×3 IMPLANT

## 2013-10-28 NOTE — Progress Notes (Signed)
Chaplain gave support to pt's family as they wait during a major coronary surgical procedure.  Chaplain engaged in compassionate conversation and empathic listening.  Please call chaplain on call tonight if needed, [phone].  Estelle June, chaplain [phone]

## 2013-10-28 NOTE — Transfer of Care (Signed)
Immediate Anesthesia Transfer of Care Note  Patient: Bradley [name]  Procedure(s) Performed: Procedure(s): REDO STERNOTOMY (N/A) REDO BENTALL PROCEDURE, debridment of aoritc root abscess, replacement of aortic root, ascending aorta and aortic valve with homograft. Insertion of left femoral arterial line (N/A) INTRAOPERATIVE TRANSESOPHAGEAL ECHOCARDIOGRAM (N/A)  Patient Location: SICU  Anesthesia Type:General  Level of Consciousness: sedated and Patient remains intubated per anesthesia plan  Airway & Oxygen Therapy: Patient remains intubated per anesthesia plan and Patient placed on Ventilator (see vital sign flow sheet for setting)  Post-op Assessment: Report given to SICU RN ,  VSS  , Pt remains critical .  Post vital signs: Reviewed and stable  Complications: No apparent anesthesia complications

## 2013-10-28 NOTE — Progress Notes (Signed)
Per [name], Rapid Wean Protocol not to be attempted tonight.

## 2013-10-28 NOTE — Progress Notes (Signed)
*  PRELIMINARY RESULTS* Echocardiogram Echocardiogram Transesophageal has been performed.  Leavy Cella 10/28/2013, 9:43 AM

## 2013-10-28 NOTE — Progress Notes (Signed)
Patient ID: Bradley [name], male   DOB: 07-Aug-1955, 58 y.o.   MRN: [id number] EVENING ROUNDS NOTE :     [address]       [place] [address]             [phone]                 Day of Surgery Procedure(s) (LRB): REDO STERNOTOMY (N/A) REDO BENTALL PROCEDURE, debridment of aoritc root abscess, replacement of aortic root, ascending aorta and aortic valve with homograft. Insertion of left femoral arterial line (N/A) INTRAOPERATIVE TRANSESOPHAGEAL ECHOCARDIOGRAM (N/A)  Total Length of Stay:  LOS: 6 days  BP 114/76  Pulse 78  Temp(Src) 98.7 F (37.1 C) (Oral)  Resp 18  Ht 6' 1.5" (1.867 m)  Wt 181 lb 14.1 oz (82.5 kg)  BMI 23.67 kg/m2  SpO2 95%  .Intake/Output     03/31 0701 - 04/01 0700   P.O.    I.V. (mL/kg) 4050 (49.1)   Blood 1730   IV Piggyback 750   Total Intake(mL/kg) 6530 (79.2)   Urine (mL/kg/hr) 800 (0.6)   Other    Blood 3000 (2.4)   Total Output 3800   Net +2730         . sodium chloride    . sodium chloride    . [START ON 10/29/2013] sodium chloride    . amiodarone (NEXTERONE PREMIX) 360 mg/200 mL dextrose    . dexmedetomidine    . DOPamine    . insulin (NOVOLIN-R) infusion    . lactated ringers    . nitroGLYCERIN    . norepinephrine (LEVOPHED) Adult infusion    . phenylephrine (NEO-SYNEPHRINE) Adult infusion       Lab Results  Component Value Date   WBC 13.5* 10/27/2013   HGB 8.8* 10/28/2013   HCT 26.0* 10/28/2013   PLT 40* 10/28/2013   GLUCOSE 242* 10/28/2013   CHOL 145 09/25/2013   TRIG 156* 09/25/2013   HDL 27* 09/25/2013   LDLCALC 87 09/25/2013   ALT 7 10/27/2013   AST 20 10/27/2013   NA 136* 10/28/2013   K 4.8 10/28/2013   CL 96 10/27/2013   CREATININE 3.16* 10/27/2013   BUN 12 10/27/2013   CO2 27 10/27/2013   TSH 10.174* 09/14/2013   PSA 1.60 02/14/2010   INR 1.44 10/28/2013   HGBA1C 4.9 10/27/2013   Now in ICU  BP 140/77 S/G not working so no CI Leave ventilated tonight   [name] MD  Beeper [phone] Office  [phone] 10/28/2013 9:57 PM

## 2013-10-28 NOTE — Brief Op Note (Addendum)
      [address]       [place] [address]             [phone]     10/22/2013 - 10/28/2013  9:54 PM  PATIENT:  Bradley [name]  58 y.o. male  PRE-OPERATIVE DIAGNOSIS:  ENDOCARDITIS/PERIAORTIC VALVE ABSCESS  POST-OPERATIVE DIAGNOSIS:  SAME  PROCEDURE:  Procedure(s): REDO STERNOTOMY REDO BENTALL PROCEDURE  With  HOMOGRAFT, and debridement of root abscess INTRAOPERATIVE TRANSESOPHAGEAL ECHOCARDIOGRAM Left femoral a line  SURGEON:  Surgeon(s): [name], MD  PHYSICIAN ASSISTANT: WAYNE GOLD PA-C  ANESTHESIA:   general  PATIENT CONDITION:  ICU - intubated and hemodynamically stable.  PRE-OPERATIVE WEIGHT: [id number]  COMPLICATIONS : NO KNOWN Aortic Valve  Procedure Performed:  Replacement: Yes.  Homograft. Implant Model Number:HVAL-CRYO, Size:23MM, Unique Device Identifier:[id number] [employer]    Repair/Reconstruction: Yes.  Root replacement with valved conduit (Bentall) Homograft. Implant Model Number:HVAL-CRYO, Size:23MM, Unique Device Identifier:[id number]   Aortic Annular Enlargement: No. Aortic Valve Etiology   Aortic Insufficiency:  Trivial/Trace  Aortic Valve Disease:  Yes.  Aortic Stenosis:  No.  Etiology (Choose at least one and up to  5 etiologies):  Endocarditis with root abscess

## 2013-10-28 NOTE — Anesthesia Postprocedure Evaluation (Signed)
Anesthesia Post Note  Patient: Bradley [name]  Procedure(s) Performed: Procedure(s) (LRB): REDO STERNOTOMY (N/A) REDO BENTALL PROCEDURE, debridment of aoritc root abscess, replacement of aortic root, ascending aorta and aortic valve with homograft. Insertion of left femoral arterial line (N/A) INTRAOPERATIVE TRANSESOPHAGEAL ECHOCARDIOGRAM (N/A)  Anesthesia type: General  Patient location: ICU  Post pain: Pain level controlled  Post assessment: Post-op Vital signs reviewed  Last Vitals:  Filed Vitals:   10/28/13 2150  BP: 133/88  Pulse: 82  Temp:   Resp: 17    Post vital signs: stable  Level of consciousness: Patient remains intubated per anesthesia plan  Complications: No apparent anesthesia complications

## 2013-10-28 NOTE — Anesthesia Preprocedure Evaluation (Addendum)
Anesthesia Evaluation  Patient identified by MRN, date of birth, ID band Patient awake    Reviewed: Allergy & Precautions, H&P , NPO status   Airway Mallampati: II      Dental   Pulmonary pneumonia -,  breath sounds clear to auscultation        Cardiovascular hypertension, + Past MI and +CHF Rhythm:Regular Rate:Normal     Neuro/Psych    GI/Hepatic negative GI ROS, Neg liver ROS,   Endo/Other  Hypothyroidism   Renal/GU Renal disease     Musculoskeletal   Abdominal   Peds  Hematology   Anesthesia Other Findings   Reproductive/Obstetrics                          Anesthesia Physical Anesthesia Plan  ASA: IV  Anesthesia Plan: General   Post-op Pain Management:    Induction: Intravenous  Airway Management Planned: Oral ETT  Additional Equipment:   Intra-op Plan:   Post-operative Plan: Post-operative intubation/ventilation  Informed Consent: I have reviewed the patients History and Physical, chart, labs and discussed the procedure including the risks, benefits and alternatives for the proposed anesthesia with the patient or authorized representative who has indicated his/her understanding and acceptance.   Dental advisory given  Plan Discussed with: CRNA, Anesthesiologist and Surgeon  Anesthesia Plan Comments:        Anesthesia Quick Evaluation

## 2013-10-28 NOTE — Progress Notes (Signed)
Patient states that he is very upset tonight because of how late hemodialysis was. He also states that he is very upset because he had to wait while is was in hemo because another patient had an accident. Patient states that he has high b/p and the only thing that people having been saying is for him to sit down. He also states that he has not eaten all day.Patient states that he would like to cancel the surgery tomorrow because he is so upset. He states the staff at [place] in the [facility] department delayed his care. He states that he doesn't want any medication for the night. Charge nurse is in the patient's room now speaking with him. RN is in room with patient trying to keep him calm. RN will try to give night medications by talking to patient and understanding his perspective. Will continue to monitor

## 2013-10-28 NOTE — Progress Notes (Signed)
Anesthesiology:  L. Internal jugular swan introducer and L. Internal jugular 2 lumen central line inserted with ultrasound guidance in [place] prior to surgery. Unable to advance swan ganz cath into RV. With visualization on fluroscopy, swan ganz catheter was curling in L.chest and not advancing into SVC. Decision made  not to attempt further SG cath advancement. Good blood return from L. IJ introducer and L. IJ central line.  Roberts Gaudy, MD

## 2013-10-28 NOTE — Progress Notes (Signed)
Patient is prepare to be taken to surgery. OR has called- report will be given. Heparin, metoprolol, CHG bath and vitals are done. Patient has been anxious but RN sat with patient to talk to him about his fears concerning the surgery this morning. Daughter and wife by his side. Patient thanks RN for all the attention and focus that he received with his care-stating that he is not scared to go down to surgery this morning. Will continue to monitor

## 2013-10-28 NOTE — OR Nursing (Signed)
Per [name] family updated by SICU charge nurse about surgery progress @ 1400

## 2013-10-29 ENCOUNTER — Encounter (HOSPITAL_COMMUNITY): Payer: Self-pay | Admitting: Cardiothoracic Surgery

## 2013-10-29 ENCOUNTER — Inpatient Hospital Stay (HOSPITAL_COMMUNITY): Payer: Medicaid Other

## 2013-10-29 ENCOUNTER — Inpatient Hospital Stay (HOSPITAL_COMMUNITY): Payer: Medicaid Other | Admitting: Anesthesiology

## 2013-10-29 ENCOUNTER — Encounter (HOSPITAL_COMMUNITY): Payer: Self-pay | Admitting: Anesthesiology

## 2013-10-29 ENCOUNTER — Encounter (HOSPITAL_COMMUNITY): Payer: Medicaid Other | Admitting: Anesthesiology

## 2013-10-29 LAB — POCT I-STAT 3, ART BLOOD GAS (G3+)
Acid-base deficit: 3 mmol/L — ABNORMAL HIGH (ref 0.0–2.0)
Acid-base deficit: 3 mmol/L — ABNORMAL HIGH (ref 0.0–2.0)
Acid-base deficit: 4 mmol/L — ABNORMAL HIGH (ref 0.0–2.0)
Acid-base deficit: 6 mmol/L — ABNORMAL HIGH (ref 0.0–2.0)
Acid-base deficit: 1 mmol/L (ref 0.0–2.0)
Bicarbonate: 22.4 mEq/L (ref 20.0–24.0)
Bicarbonate: 21.7 mEq/L (ref 20.0–24.0)
Bicarbonate: 19.7 mEq/L — ABNORMAL LOW (ref 20.0–24.0)
Bicarbonate: 23.8 mEq/L (ref 20.0–24.0)
Bicarbonate: 22.5 mEq/L (ref 20.0–24.0)
O2 Saturation: 95 %
O2 Saturation: 97 %
O2 Saturation: 88 %
O2 Saturation: 97 %
O2 Saturation: 100 %
Patient temperature: 36
Patient temperature: 99
Patient temperature: 99.2
Patient temperature: 99.2
TCO2: 24 mmol/L (ref 0–100)
TCO2: 23 mmol/L (ref 0–100)
TCO2: 21 mmol/L (ref 0–100)
TCO2: 26 mmol/L (ref 0–100)
TCO2: 23 mmol/L (ref 0–100)
pCO2 arterial: 36.9 mmHg (ref 35.0–45.0)
pCO2 arterial: 38.8 mmHg (ref 35.0–45.0)
pCO2 arterial: 29.6 mmHg — ABNORMAL LOW (ref 35.0–45.0)
pCO2 arterial: 70.6 mmHg (ref 35.0–45.0)
pCO2 arterial: 33.7 mmHg — ABNORMAL LOW (ref 35.0–45.0)
pH, Arterial: 7.387 (ref 7.350–7.450)
pH, Arterial: 7.356 (ref 7.350–7.450)
pH, Arterial: 7.138 — CL (ref 7.350–7.450)
pH, Arterial: 7.432 (ref 7.350–7.450)
pH, Arterial: 7.432 (ref 7.350–7.450)
pO2, Arterial: 73 mmHg — ABNORMAL LOW (ref 80.0–100.0)
pO2, Arterial: 91 mmHg (ref 80.0–100.0)
pO2, Arterial: 73 mmHg — ABNORMAL LOW (ref 80.0–100.0)
pO2, Arterial: 90 mmHg (ref 80.0–100.0)
pO2, Arterial: 200 mmHg — ABNORMAL HIGH (ref 80.0–100.0)

## 2013-10-29 LAB — BASIC METABOLIC PANEL
BUN: 12 mg/dL (ref 6–23)
BUN: 15 mg/dL (ref 6–23)
CO2: 20 mEq/L (ref 19–32)
CO2: 21 mEq/L (ref 19–32)
Calcium: 9.3 mg/dL (ref 8.4–10.5)
Calcium: 9.3 mg/dL (ref 8.4–10.5)
Chloride: 101 mEq/L (ref 96–112)
Chloride: 97 mEq/L (ref 96–112)
Creatinine, Ser: 2.37 mg/dL — ABNORMAL HIGH (ref 0.50–1.35)
Creatinine, Ser: 2.92 mg/dL — ABNORMAL HIGH (ref 0.50–1.35)
GFR calc Af Amer: 34 mL/min — ABNORMAL LOW (ref >90)
GFR calc Af Amer: 26 mL/min — ABNORMAL LOW (ref >90)
GFR calc non Af Amer: 29 mL/min — ABNORMAL LOW (ref >90)
GFR calc non Af Amer: 23 mL/min — ABNORMAL LOW (ref >90)
Glucose, Bld: 114 mg/dL — ABNORMAL HIGH (ref 70–99)
Glucose, Bld: 159 mg/dL — ABNORMAL HIGH (ref 70–99)
Potassium: 5.6 mEq/L — ABNORMAL HIGH (ref 3.7–5.3)
Potassium: 6.3 mEq/L — ABNORMAL HIGH (ref 3.7–5.3)
Sodium: 135 mEq/L — ABNORMAL LOW (ref 137–147)
Sodium: 137 mEq/L (ref 137–147)

## 2013-10-29 LAB — GLUCOSE, CAPILLARY
Glucose-Capillary: 104 mg/dL — ABNORMAL HIGH (ref 70–99)
Glucose-Capillary: 109 mg/dL — ABNORMAL HIGH (ref 70–99)
Glucose-Capillary: 113 mg/dL — ABNORMAL HIGH (ref 70–99)
Glucose-Capillary: 127 mg/dL — ABNORMAL HIGH (ref 70–99)
Glucose-Capillary: 133 mg/dL — ABNORMAL HIGH (ref 70–99)
Glucose-Capillary: 76 mg/dL (ref 70–99)
Glucose-Capillary: 99 mg/dL (ref 70–99)
Glucose-Capillary: 143 mg/dL — ABNORMAL HIGH (ref 70–99)
Glucose-Capillary: 116 mg/dL — ABNORMAL HIGH (ref 70–99)
Glucose-Capillary: 108 mg/dL — ABNORMAL HIGH (ref 70–99)
Glucose-Capillary: 116 mg/dL — ABNORMAL HIGH (ref 70–99)
Glucose-Capillary: 120 mg/dL — ABNORMAL HIGH (ref 70–99)
Glucose-Capillary: 127 mg/dL — ABNORMAL HIGH (ref 70–99)
Glucose-Capillary: 192 mg/dL — ABNORMAL HIGH (ref 70–99)
Glucose-Capillary: 87 mg/dL (ref 70–99)
Glucose-Capillary: 88 mg/dL (ref 70–99)
Glucose-Capillary: 94 mg/dL (ref 70–99)

## 2013-10-29 LAB — CBC
HCT: 32.2 % — ABNORMAL LOW (ref 39.0–52.0)
HCT: 33 % — ABNORMAL LOW (ref 39.0–52.0)
HCT: 30 % — ABNORMAL LOW (ref 39.0–52.0)
Hemoglobin: 10.8 g/dL — ABNORMAL LOW (ref 13.0–17.0)
Hemoglobin: 10.7 g/dL — ABNORMAL LOW (ref 13.0–17.0)
Hemoglobin: 9.8 g/dL — ABNORMAL LOW (ref 13.0–17.0)
MCH: 29 pg (ref 26.0–34.0)
MCH: 28.8 pg (ref 26.0–34.0)
MCH: 28.3 pg (ref 26.0–34.0)
MCHC: 33.5 g/dL (ref 30.0–36.0)
MCHC: 32.4 g/dL (ref 30.0–36.0)
MCHC: 32.7 g/dL (ref 30.0–36.0)
MCV: 86.6 fL (ref 78.0–100.0)
MCV: 88.7 fL (ref 78.0–100.0)
MCV: 86.7 fL (ref 78.0–100.0)
Platelets: 129 10*3/uL — ABNORMAL LOW (ref 150–400)
Platelets: 130 10*3/uL — ABNORMAL LOW (ref 150–400)
Platelets: 123 10*3/uL — ABNORMAL LOW (ref 150–400)
RBC: 3.72 MIL/uL — ABNORMAL LOW (ref 4.22–5.81)
RBC: 3.72 MIL/uL — ABNORMAL LOW (ref 4.22–5.81)
RBC: 3.46 MIL/uL — ABNORMAL LOW (ref 4.22–5.81)
RDW: 21.2 % — ABNORMAL HIGH (ref 11.5–15.5)
RDW: 22.1 % — ABNORMAL HIGH (ref 11.5–15.5)
RDW: 21.7 % — ABNORMAL HIGH (ref 11.5–15.5)
WBC: 35.9 10*3/uL — ABNORMAL HIGH (ref 4.0–10.5)
WBC: 26.7 10*3/uL — ABNORMAL HIGH (ref 4.0–10.5)
WBC: 20.1 10*3/uL — ABNORMAL HIGH (ref 4.0–10.5)

## 2013-10-29 LAB — POCT I-STAT, CHEM 8
BUN: 17 mg/dL (ref 6–23)
BUN: 14 mg/dL (ref 6–23)
Calcium, Ion: 1.38 mmol/L — ABNORMAL HIGH (ref 1.12–1.23)
Calcium, Ion: 1.24 mmol/L — ABNORMAL HIGH (ref 1.12–1.23)
Chloride: 102 mEq/L (ref 96–112)
Chloride: 102 mEq/L (ref 96–112)
Creatinine, Ser: 3.2 mg/dL — ABNORMAL HIGH (ref 0.50–1.35)
Creatinine, Ser: 3.4 mg/dL — ABNORMAL HIGH (ref 0.50–1.35)
Glucose, Bld: 156 mg/dL — ABNORMAL HIGH (ref 70–99)
Glucose, Bld: 150 mg/dL — ABNORMAL HIGH (ref 70–99)
HCT: 32 % — ABNORMAL LOW (ref 39.0–52.0)
HCT: 30 % — ABNORMAL LOW (ref 39.0–52.0)
Hemoglobin: 10.9 g/dL — ABNORMAL LOW (ref 13.0–17.0)
Hemoglobin: 10.2 g/dL — ABNORMAL LOW (ref 13.0–17.0)
Potassium: 5.7 mEq/L — ABNORMAL HIGH (ref 3.7–5.3)
Potassium: 4.4 mEq/L (ref 3.7–5.3)
Sodium: 134 mEq/L — ABNORMAL LOW (ref 137–147)
Sodium: 135 mEq/L — ABNORMAL LOW (ref 137–147)
TCO2: 25 mmol/L (ref 0–100)
TCO2: 22 mmol/L (ref 0–100)

## 2013-10-29 LAB — PROTIME-INR
INR: 1 (ref 0.00–1.49)
INR: 1.51 — ABNORMAL HIGH (ref 0.00–1.49)
Prothrombin Time: 13 seconds (ref 11.6–15.2)
Prothrombin Time: 17.8 seconds — ABNORMAL HIGH (ref 11.6–15.2)

## 2013-10-29 LAB — CREATININE, SERUM
Creatinine, Ser: 2.97 mg/dL — ABNORMAL HIGH (ref 0.50–1.35)
GFR calc Af Amer: 26 mL/min — ABNORMAL LOW (ref >90)
GFR calc non Af Amer: 22 mL/min — ABNORMAL LOW (ref >90)

## 2013-10-29 LAB — CK TOTAL AND CKMB (NOT AT ARMC)
CK, MB: >300 ng/mL (ref 0.3–4.0)
Total CK: 4213 U/L — ABNORMAL HIGH (ref 7–232)

## 2013-10-29 LAB — CULTURE, BLOOD (ROUTINE X 2)
Culture: NO GROWTH
Culture: NO GROWTH

## 2013-10-29 LAB — PREPARE PLATELET PHERESIS
Unit division: 0
Unit division: 0

## 2013-10-29 LAB — PREPARE CRYOPRECIPITATE
Unit division: 0
Unit division: 0

## 2013-10-29 LAB — PREPARE FRESH FROZEN PLASMA
Unit division: 0
Unit division: 0
Unit division: 0
Unit division: 0
Unit division: 0
Unit division: 0

## 2013-10-29 LAB — MAGNESIUM
Magnesium: 2.2 mg/dL (ref 1.5–2.5)
Magnesium: 2.3 mg/dL (ref 1.5–2.5)
Magnesium: 2 mg/dL (ref 1.5–2.5)

## 2013-10-29 LAB — APTT
aPTT: 45 seconds — ABNORMAL HIGH (ref 24–37)
aPTT: 41 seconds — ABNORMAL HIGH (ref 24–37)

## 2013-10-29 LAB — PHOSPHORUS: Phosphorus: 5.5 mg/dL — ABNORMAL HIGH (ref 2.3–4.6)

## 2013-10-29 LAB — TROPONIN I: Troponin I: >20 ng/mL (ref <0.30)

## 2013-10-29 LAB — LACTIC ACID, PLASMA: Lactic Acid, Venous: 4.6 mmol/L — ABNORMAL HIGH (ref 0.5–2.2)

## 2013-10-29 MED ORDER — NAFCILLIN SODIUM 2 G IJ SOLR: 2.0000 g | INTRAMUSCULAR | Status: DC

## 2013-10-29 MED ORDER — ALBUMIN HUMAN 5 % IV SOLN
INTRAVENOUS | Status: AC
  Administered 2013-10-29: 12.5 g
  Filled 2013-10-29: qty 250

## 2013-10-29 MED ORDER — ALBUMIN HUMAN 5 % IV SOLN
12.5000 g | Freq: Once | INTRAVENOUS | Status: AC
  Filled 2013-10-29: qty 250

## 2013-10-29 MED ORDER — BIOTENE DRY MOUTH MT LIQD
15.0000 mL | Freq: Two times a day (BID) | OROMUCOSAL | Status: DC
  Administered 2013-10-29 – 2013-10-30 (×4): 15 mL via OROMUCOSAL

## 2013-10-29 MED ORDER — NAFCILLIN SODIUM 2 G IJ SOLR
2.0000 g | INTRAVENOUS | Status: DC
  Administered 2013-10-29 – 2013-11-06 (×48): 2 g via INTRAVENOUS
  Filled 2013-10-29 (×56): qty 2000

## 2013-10-29 MED ORDER — ALBUMIN HUMAN 5 % IV SOLN
12.5000 g | Freq: Once | INTRAVENOUS | Status: AC
  Administered 2013-10-29: 12.5 g via INTRAVENOUS

## 2013-10-29 MED ORDER — CHLORHEXIDINE GLUCONATE 0.12 % MT SOLN
15.0000 mL | Freq: Two times a day (BID) | OROMUCOSAL | Status: DC
  Administered 2013-10-29 – 2013-11-12 (×26): 15 mL via OROMUCOSAL
  Filled 2013-10-29 (×30): qty 15

## 2013-10-29 MED ORDER — SODIUM CHLORIDE 0.9 % IV SOLN
INTRAVENOUS | Status: DC
  Administered 2013-10-29 – 2013-10-30 (×2): via INTRAVENOUS
  Administered 2013-11-01: 1 mL via INTRAVENOUS
  Administered 2013-11-02: 12:00:00 via INTRAVENOUS
  Administered 2013-11-02 – 2013-11-03 (×2): 1 mL via INTRAVENOUS
  Administered 2013-11-03 – 2013-11-04 (×3): via INTRAVENOUS

## 2013-10-29 MED ORDER — SODIUM BICARBONATE 8.4 % IV SOLN
25.0000 meq | Freq: Once | INTRAVENOUS | Status: AC
  Administered 2013-10-29: 25 meq via INTRAVENOUS

## 2013-10-29 MED ORDER — ACETAMINOPHEN 650 MG RE SUPP
650.0000 mg | Freq: Four times a day (QID) | RECTAL | Status: DC
  Administered 2013-10-29 – 2013-10-31 (×5): 650 mg via RECTAL
  Filled 2013-10-29 (×10): qty 1

## 2013-10-29 MED ORDER — INSULIN ASPART 100 UNIT/ML ~~LOC~~ SOLN
0.0000 [IU] | SUBCUTANEOUS | Status: DC
  Administered 2013-10-30 – 2013-11-01 (×3): 2 [IU] via SUBCUTANEOUS

## 2013-10-29 MED ORDER — SODIUM CHLORIDE 0.9 % IV SOLN
300.0000 mg | Freq: Three times a day (TID) | INTRAVENOUS | Status: AC
  Administered 2013-10-29 – 2013-11-02 (×13): 300 mg via INTRAVENOUS
  Filled 2013-10-29 (×14): qty 300

## 2013-10-29 MED FILL — Magnesium Sulfate Inj 50%: INTRAMUSCULAR | Qty: 10 | Status: AC

## 2013-10-29 MED FILL — Potassium Chloride Inj 2 mEq/ML: INTRAVENOUS | Qty: 40 | Status: AC

## 2013-10-29 MED FILL — Heparin Sodium (Porcine) Inj 1000 Unit/ML: INTRAMUSCULAR | Qty: 30 | Status: AC

## 2013-10-29 NOTE — Progress Notes (Signed)
10/29/13 1630  Vitals  ECG Heart Rate 61  Resp 14  Art Line  Arterial Line BP 78/46 mmHg  Arterial Line MAP (mmHg) 55 mmHg  Art Line (2)  Arterial Line BP 2 73/46 mmHg  Arterial Line MAP (mmHg) 55 mmHg  Invasive Hemodynamic Monitoring  CVP (mmHg) 9 mmHg  Attempted to AAI pace patient at 80 with MA 10 and sensitivity of 0.5. Pacer not sensing appropriately. Adjusted sensitivity up and down and increased MA as well as reversing polarity of atrial wires.Also attempted VVI and DDD with same results. SBP 77-80 despite increasing phenylephrine.  phenylephrine. [name] notified of above. Changed pacemaker generator and was able to DDD pace patient at 89. Atrial and ventricular MA at 10, atrial sensitivity 0.5 and ventricular 2.0.

## 2013-10-29 NOTE — Progress Notes (Signed)
CRITICAL VALUE ALERT  Critical value received:CKMB > 300, CK total 4213 and Troponin > 20  Date of notification:  10/29/2013  Time of notification:  [id number]  Critical value read back:yes  Nurse who received alert:  Rodney Cruise  MD notified (1st page):  Servando Snare  Time of first page:  1131   MD notified (2nd page):  Time of second page:  Responding MD:  Servando Snare   Time MD responded:  [phone]

## 2013-10-29 NOTE — Progress Notes (Addendum)
[facility]    Subjective: Intubated   Antibiotics:  Anti-infectives   Start     Dose/Rate Route Frequency Ordered Stop   10/29/13 1600  levofloxacin (LEVAQUIN) IVPB 500 mg  Status:  Discontinued     500 mg 100 mL/hr over 60 Minutes Intravenous Every 48 hours 10/27/13 1457 10/29/13 0857   10/29/13 1000  rifampin (RIFADIN) 300 mg in sodium chloride 0.9 % 100 mL IVPB     300 mg 200 mL/hr over 30 Minutes Intravenous 3 times per day 10/29/13 0858     10/29/13 1000  nafcillin 2 g in dextrose 5 % 50 mL IVPB     2 g 100 mL/hr over 30 Minutes Intravenous 6 times per day 10/29/13 0901     10/29/13 0900  nafcillin injection 2 g  Status:  Discontinued     2 g Intravenous Every 4 hours 10/29/13 0857 10/29/13 0900   10/29/13 0500  vancomycin (VANCOCIN) IVPB 1000 mg/200 mL premix     1,000 mg 200 mL/hr over 60 Minutes Intravenous  Once 10/28/13 2146 10/29/13 0600   10/29/13 0100  cefUROXime (ZINACEF) 1.5 g in dextrose 5 % 50 mL IVPB  Status:  Discontinued     1.5 g 100 mL/hr over 30 Minutes Intravenous Every 12 hours 10/28/13 2146 10/29/13 0857   10/28/13 0400  vancomycin (VANCOCIN) 1,250 mg in sodium chloride 0.9 % 250 mL IVPB     1,250 mg 166.7 mL/hr over 90 Minutes Intravenous To Surgery 10/27/13 1450 10/28/13 0905   10/28/13 0400  cefUROXime (ZINACEF) 1.5 g in dextrose 5 % 50 mL IVPB     1.5 g 100 mL/hr over 30 Minutes Intravenous To Surgery 10/27/13 1450 10/28/13 2030   10/28/13 0400  cefUROXime (ZINACEF) 750 mg in dextrose 5 % 50 mL IVPB  Status:  Discontinued     750 mg 100 mL/hr over 30 Minutes Intravenous To Surgery 10/27/13 1447 10/28/13 2114   10/27/13 1600  levofloxacin (LEVAQUIN) IVPB 750 mg     750 mg 100 mL/hr over 90 Minutes Intravenous  Once 10/27/13 1456 10/28/13 0231   10/23/13 0800  ceFAZolin (ANCEF) IVPB 1 g/50 mL premix  Status:  Discontinued     1 g 100 mL/hr over 30 Minutes Intravenous  Once 10/22/13 2313 10/22/13 2326   10/22/13 2359   ceFAZolin (ANCEF) IVPB 2 g/50 mL premix  Status:  Discontinued     2 g 100 mL/hr over 30 Minutes Intravenous Every M-W-F (Hemodialysis) 10/22/13 2314 10/28/13 2146   10/22/13 2315  ceFAZolin (ANCEF) IVPB 2 g/50 mL premix  Status:  Discontinued     2 g 100 mL/hr over 30 Minutes Intravenous  Once 10/22/13 2313 10/22/13 2326      Medications: Scheduled Meds: . acetaminophen  1,000 mg Oral 4 times per day   Or  . acetaminophen (TYLENOL) oral liquid 160 mg/5 mL  1,000 mg Per Tube 4 times per day  . acetaminophen (TYLENOL) oral liquid 160 mg/5 mL  650 mg Per Tube Once   Or  . acetaminophen  650 mg Rectal Once  . aspirin EC  325 mg Oral Daily   Or  . aspirin  324 mg Per Tube Daily  . bisacodyl  10 mg Oral Daily   Or  . bisacodyl  10 mg Rectal Daily  . docusate sodium  200 mg Oral Daily  . famotidine (PEPCID) IV  20 mg Intravenous Q12H  . insulin regular  0-10 Units  Intravenous TID WC  . levothyroxine  200 mcg Oral QAC breakfast  . metoprolol tartrate  12.5 mg Oral BID   Or  . metoprolol tartrate  12.5 mg Per Tube BID  . nafcillin IV  2 g Intravenous 6 times per day  . [START ON 10/30/2013] pantoprazole  40 mg Oral Daily  . rifampin (RIFADIN) IVPB  300 mg Intravenous 3 times per day  . sodium chloride  3 mL Intravenous Q12H   Continuous Infusions: . sodium chloride 20 mL/hr at 10/28/13 2200  . amiodarone (NEXTERONE PREMIX) 360 mg/200 mL dextrose 30 mg/hr (10/29/13 0800)  . dexmedetomidine Stopped (10/29/13 0730)  . DOPamine 3 mcg/kg/min (10/29/13 0800)  . insulin (NOVOLIN-R) infusion 0.8 Units/hr (10/29/13 0904)  . lactated ringers 20 mL/hr at 10/28/13 2150  . nitroGLYCERIN Stopped (10/28/13 2245)  . norepinephrine (LEVOPHED) Adult infusion Stopped (10/28/13 2200)  . phenylephrine (NEO-SYNEPHRINE) Adult infusion 35 mcg/min (10/29/13 0800)   PRN Meds:.albumin human, fentaNYL, metoprolol, midazolam, ondansetron (ZOFRAN) IV, sodium chloride, traMADol   Objective: Weight change:  -4 lb 6.6 oz (-2 kg)  Intake/Output Summary (Last 24 hours) at 10/29/13 0947 Last data filed at 10/29/13 0900  Gross per 24 hour  Intake 9461.37 ml  Output   4430 ml  Net 5031.37 ml   Blood pressure 91/70, pulse 80, temperature 98.8 F (37.1 C), temperature source Oral, resp. rate 12, height 6' 1.5" (1.867 m), weight 203 lb 7.8 oz (92.3 kg), SpO2 100.00%. Temp:  [98.4 F (36.9 C)-99.2 F (37.3 C)] 98.8 F (37.1 C) (04/01 0900) Pulse Rate:  [79-116] 80 (04/01 0840) Resp:  [11-17] 12 (04/01 0900) BP: (82-133)/(61-88) 91/70 mmHg (04/01 0900) SpO2:  [97 %-100 %] 100 % (04/01 0840) Arterial Line BP: (94-151)/(40-84) 113/70 mmHg (04/01 0900) FiO2 (%):  [40 %-50 %] 40 % (04/01 0840) Weight:  [182 lb 1.6 oz (82.6 kg)-203 lb 7.8 oz (92.3 kg)] 203 lb 7.8 oz (92.3 kg) (04/01 0500)  Physical Exam: General: awake, moving around, on ventilator HEENT: anicteric sclera, , EOMI CVS tachy rate, normal r,   Chest: fairly clear to auscultation bilaterally, Abdomen: soft nontender, nondistended, normal bowel sounds, Extremities: no  clubbing or edema noted bilaterally Skin: sternal wound dressed  Neuro: nonfocal  Lab Results:  Recent Labs  10/28/13 2220 10/28/13 2223 10/29/13 0400  WBC 37.9*  --  35.9*  HGB 10.5* 11.2* 10.8*  HCT 32.2* 33.0* 32.2*  PLT 121*  --  129*    BMET  Recent Labs  10/27/13 1833  10/28/13 2223 10/29/13 0400  NA 136*  < > 136* 135*  K 4.4  < > 4.3 5.6*  CL 96  --   --  101  CO2 27  --   --  20  GLUCOSE 100*  < > 125* 114*  BUN 12  --   --  12  CREATININE 3.16*  --   --  2.37*  CALCIUM 8.5  --   --  9.3  < > = values in this interval not displayed.  Micro Results: Recent Results (from the past 240 hour(s))  CLOSTRIDIUM DIFFICILE BY PCR     Status: None   Collection Time    10/22/13 10:04 PM      Result Value Ref Range Status   C difficile by pcr NEGATIVE  NEGATIVE Final  CULTURE, BLOOD (ROUTINE X 2)     Status: None   Collection Time     10/22/13 10:26 PM      Result Value  Ref Range Status   Specimen Description BLOOD RIGHT ARM   Final   Special Requests BOTTLES DRAWN AEROBIC ONLY 3CC   Final   Culture  Setup Time     Final   Value: 10/23/2013 04:42     Performed at [facility]   Culture     Final   Value: NO GROWTH 5 DAYS     Performed at [facility]   Report Status 10/29/2013 FINAL   Final  CULTURE, BLOOD (ROUTINE X 2)     Status: None   Collection Time    10/22/13 10:34 PM      Result Value Ref Range Status   Specimen Description BLOOD RIGHT HAND   Final   Special Requests BOTTLES DRAWN AEROBIC ONLY [facility]   Final   Culture  Setup Time     Final   Value: 10/23/2013 04:42     Performed at [facility]   Culture     Final   Value: NO GROWTH 5 DAYS     Performed at [facility]   Report Status 10/29/2013 FINAL   Final  SURGICAL PCR SCREEN     Status: None   Collection Time    10/27/13  1:13 PM      Result Value Ref Range Status   MRSA, PCR NEGATIVE  NEGATIVE Final   Staphylococcus aureus NEGATIVE  NEGATIVE Final   Comment:            The Xpert SA Assay (FDA     approved for NASAL specimens     in patients over 85 years of age),     is one component of     a comprehensive surveillance     program.  Test performance has     been validated by [employer] for patients greater     than or equal to 56 year old.     It is not intended     to diagnose infection nor to     guide or monitor treatment.  TISSUE CULTURE     Status: None   Collection Time    10/28/13  1:20 PM      Result Value Ref Range Status   Specimen Description TISSUE AORTA   Final   Special Requests PT ON VANCOMYCIN AND ZINACEF   Final   Gram Stain     Final   Value: RARE WBC PRESENT,BOTH PMN AND MONONUCLEAR     NO ORGANISMS SEEN     Performed at [facility]   Culture PENDING   Incomplete   Report Status PENDING   Incomplete  GRAM STAIN     Status: None   Collection Time    10/28/13  2:26 PM       Result Value Ref Range Status   Specimen Description ABSCESS AORTA   Final   Special Requests PT ON VANCOMYCIN AND ZINACEF   Final   Gram Stain     Final   Value: RARE WBC PRESENT, PREDOMINANTLY PMN     NO ORGANISMS SEEN   Report Status 10/28/2013 FINAL   Final  ANAEROBIC CULTURE     Status: None   Collection Time    10/28/13  2:26 PM      Result Value Ref Range Status   Specimen Description ABSCESS AORTA   Final   Special Requests PT ON VANCOMYCIN AND ZINACEF   Final   Gram Stain  Final   Value: RARE WBC PRESENT,BOTH PMN AND MONONUCLEAR     NO ORGANISMS SEEN     Performed at [facility]   Culture PENDING   Incomplete   Report Status PENDING   Incomplete  CULTURE, ROUTINE-ABSCESS     Status: None   Collection Time    10/28/13  2:26 PM      Result Value Ref Range Status   Specimen Description ABSCESS AORTA   Final   Special Requests PT ON VANCOMYCIN AND ZINACEF   Final   Gram Stain     Final   Value: RARE WBC PRESENT, PREDOMINANTLY PMN     NO SQUAMOUS EPITHELIAL CELLS SEEN     NO ORGANISMS SEEN     Performed at [facility]     Performed at [facility]   Culture     Final   Value: NO GROWTH 1 DAY     Performed at [facility]   Report Status PENDING   Incomplete    Studies/Results: Dg Chest 1 View  10/27/2013   CLINICAL DATA:  Preop aortic valve replacement  EXAM: CHEST - 1 VIEW  COMPARISON:  CT HEART MORPH/PULM VEIN W/CM&W/O CA SCORE dated 10/26/2013; DG CHEST 1V PORT dated 09/18/2013  FINDINGS: Mild to moderate cardiac enlargement. Prominence of the aortic arch. Status post median sternotomy. Replacement cardiac aortic valve noted. Vascular pattern is normal. Lungs are clear except for mild scarring or atelectasis at the left base. There is a dual-lumen central line on the right with tips projecting over the right atrium.  IMPRESSION: No acute findings.   Electronically Signed   By: Skipper Cliche M.D.   On: 10/27/2013 17:32   Dg  Chest Portable 1 View In Am  10/29/2013   CLINICAL DATA:  Status post cardiac Sir  EXAM: PORTABLE CHEST - 1 VIEW  COMPARISON:  10/28/2013  FINDINGS: A a right-sided dialysis catheter and right-sided chest tube are again noted and stable. A nasogastric catheter is coiled within the stomach. The endotracheal tube is again seen 6 cm above the carina. A left jugular central line is noted via a sheath and coiled likely within the subclavian vein. This is stable from the prior exam. A mediastinal drain is noted as well.  Cardiac shadow is stable. Persistent left lower lobe atelectasis is noted. No new focal abnormality is seen. No pneumothorax is noted.  IMPRESSION: The overall appearance of the chest is stable from the prior exam.   Electronically Signed   By: Inez Catalina M.D.   On: 10/29/2013 07:53   Dg Chest Portable 1 View  10/28/2013   CLINICAL DATA:  Postop, check support apparatus  EXAM: PORTABLE CHEST - 1 VIEW  COMPARISON:  Prior chest x-ray earlier today at 8:08 a.m.;  FINDINGS: Endotracheal tube terminates 3.6 cm above the carina. Stable position of right IJ approach hemodialysis catheter with tips projecting over the right atrium. Nasogastric tube is incompletely imaged. The tip lies below the diaphragm presumably within the stomach. Bilateral chest tubes are now present. Unchanged position of what appears to a left IJ vascular sheath and a left IJ central venous catheter. The tip of the catheter is folded upon itself in overlies the left upper lung. The position of this catheter is uncertain, however are remains unchanged. Stable cardiomegaly and mediastinal widening. No pneumothorax. Low inspiratory volumes with bibasilar atelectasis.  IMPRESSION: 1. Unchanged position of the left IJ approach central venous catheters. The catheter tip remains fold  upon itself and an on certain location. If intravenous, it is likely within the subclavian vein. 2. Status post redo median sternotomy. New bilateral chest  tubes and mediastinal drain. 3. Other support apparatus in stable and satisfactory position. 4. No significant interval change in the appearance of the chest.   Electronically Signed   By: Jacqulynn Cadet M.D.   On: 10/28/2013 22:28   Dg Chest Portable 1 View  10/28/2013   CLINICAL DATA:  Status post line placement  EXAM: PORTABLE CHEST - 1 VIEW  COMPARISON:  DG CHEST 1 VIEW dated 10/27/2013  FINDINGS: There is a catheter like structure that projects in the left internal jugular region but the terminus of this catheter is unclear. It appears to coil upon itself and the tip redirected superiorly outside the margin of the image. The loop overlies the anterior aspect of the left second rib. There is no postprocedure pneumothorax. The endotracheal tube tip lies approximately 4.7 cm above the crotch of the carina. The dual-lumen dialysis type catheter placed via the right subclavian approach is unchanged. The esophagogastric tube tip and proximal port project off the inferior margin of the image. The lungs are mildly hypoinflated. There is retrocardiac atelectasis on the left. The cardiac silhouette is enlarged. There is a prosthetic aortic valve.  IMPRESSION: The positioning of the internal jugular catheter on the left is not adequate. The catheter may a folded upon itself and redirected its tip superiorly. Removal and replacement is recommended. These results were called by telephone at the time of interpretation on 10/28/2013 at 8:23 AM to Dineen Kid RN, who verbally acknowledged these results.   Electronically Signed   By: David  Martinique   On: 10/28/2013 08:26   Dg C-arm 1-60 Min-no Report  10/28/2013   CLINICAL DATA: line placement   C-ARM 1-60 MINUTES  Fluoroscopy was utilized by the requesting physician.  No radiographic  interpretation.       Assessment/Plan: Bradley [name] is a 58 y.o. 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  My partner had concerns for increased risk of ototoxicity, but my colleagues Heide Guile and co have researched further and we fell that the risk is worth it given we would want to give him OPTIMAL therapy given this severe infection  WE would use for only 2 weeks and would use as low a dose as possible  Will revisit with family later today  We could check the [id number] mitochondrial mutation though wonder turnaround time  With re to Naf vs Ancef we should repeat MRI of the brain if we wanted to go back to ancef (since CNS penetration is lousy with this)  I spent greater than 25 minutes with the patient including greater than 50% of time in face to face counsel of the patient and in coordination of their care.  I saw patient in the afternoon as well just after he had been extubated and when he began  twitching his facial muscles, arms, hands and became ashy gray and required re-intubation.  I called the pts wife on her home phone number and alerted her of her husbands extubation and reintubation and asked that she call [phone] floor to touch base with [name].  I reviewed rationale for using IV gentamicin x 2 weeks as part of his therapy and our feeling that the risk of ototoxicty is outweighted by the benefit of giving him maximal medical therapy after thi his 3rd open heart surgery to maximize chances of curing this infection  I will order gentamicin to start up in the am      LOS: 7 days   Alcide Evener 10/29/2013, 9:47 AM

## 2013-10-29 NOTE — Plan of Care (Signed)
Problem: Phase II - Intermediate Post-Op Goal: Wean to Extubate Outcome: Progressing Remains weak and sleepy. attemted to do SIMV 4 on 40% FiO2 and 5 of peep earlier this am.. Did not breathe over ventilator. Will attempt again today. Goal: Maintain Hemodynamic Stability Outcome: Progressing Continues on dopamine and neosynephrine drips. CVP 8-15. Goal: CBGs/Blood Glucose per SCIP Criteria Outcome: Progressing CBGs on insulin drip ranging from 80's-143.

## 2013-10-29 NOTE — Anesthesia Preprocedure Evaluation (Signed)
Anesthesia Evaluation  Patient identified by MRN, date of birth, ID band Patient awake    Reviewed: Allergy & Precautions, NPO status , Patient's Chart, lab work & pertinent test results  History of Anesthesia Complications Negative for: history of anesthetic complications  Airway Mallampati: III   Neck ROM: Full    Dental  (+) Implants Lower molar chipped:   Pulmonary sleep apnea and Continuous Positive Airway Pressure Ventilation , neg COPD, Patient abstained from smoking.Not current smoker   Pulmonary exam normal breath sounds clear to auscultation       Cardiovascular Exercise Tolerance: Good METShypertension, (-) CAD and (-) Past MI Normal cardiovascular exam(-) dysrhythmias  Rhythm:Regular Rate:Normal     Neuro/Psych negative neurological ROS  negative psych ROS   GI/Hepatic ,GERD  ,,(+)     (-) substance abuse    Endo/Other  diabetes, Type 2Hypothyroidism    Renal/GU Renal disease (nephrolithiasis)negative Renal ROS     Musculoskeletal  (+) Arthritis ,    Abdominal   Peds  Hematology negative hematology ROS (+)   Anesthesia Other Findings Past Medical History: No date: Arthritis No date: B12 deficiency No date: Bladder polyps No date: Diverticulitis No date: GERD (gastroesophageal reflux disease) No date: History of blood in urine No date: History of chickenpox No date: History of German measles No date: History of kidney stones No date: History of mumps No date: Hypertension No date: Hypothyroidism No date: IBS (irritable bowel syndrome) No date: LPRD (laryngopharyngeal reflux disease) No date: Sleep apnea     Comment:  CPAP  Reproductive/Obstetrics                              Anesthesia Physical Anesthesia Plan  ASA: 3  Anesthesia Plan: MAC   Post-op Pain Management: Minimal or no pain anticipated   Induction: Intravenous  PONV Risk Score and Plan: 2 and  TIVA, Midazolam and Treatment may vary due to age or medical condition  Airway Management Planned: Nasal Cannula  Additional Equipment:   Intra-op Plan:   Post-operative Plan:   Informed Consent: I have reviewed the patients History and Physical, chart, labs and discussed the procedure including the risks, benefits and alternatives for the proposed anesthesia with the patient or authorized representative who has indicated his/her understanding and acceptance.       Plan Discussed with: CRNA  Anesthesia Plan Comments: (Explained risks of anesthesia, including PONV, and rare emergencies such as cardiac events, respiratory problems, and allergic reactions, requiring invasive intervention. Discussed the role of CRNA in patient's perioperative care. Patient understands. )         Anesthesia Quick Evaluation  

## 2013-10-29 NOTE — Progress Notes (Signed)
Responded to [employer] to provide support to staff. Pt. accidentally extubated himself and is being re-intubated.  No family presence. Family will be notified by staff. Patient recovered and was having  Bradley [name] upon my departure.Will   follow as needed.

## 2013-10-29 NOTE — Progress Notes (Signed)
10/29/13 1445  Vitals  Temp 99.2 F (37.3 C)  ECG Heart Rate ! 118  Resp ! 27  Oxygen Therapy  FiO2 (%) 40 % (CPAP/PS)  Art Line  Arterial Line BP 102/61 mmHg  Arterial Line MAP (mmHg) 72 mmHg  Art Line (2)  Arterial Line BP 2 82/60 mmHg  Arterial Line MAP (mmHg) 68 mmHg  Invasive Hemodynamic Monitoring  CVP (mmHg) 7 mmHg  1445 -DR. Gerhardt notified of weaning parameters and abg results. Nif -25 and FVC  Of 1020. ABG: pH 7.43, pCO2 30, pO2 90, Bicarb 20, total CO2 21, base excess -4. Given UOP a5 ml/hr x3 and CVP 8. Order for sodium bicarbonate 25 meq IV given. OK to extubate. Sodium bicarbonate 25 meq given.

## 2013-10-29 NOTE — Progress Notes (Signed)
10/29/13 1500  Vitals  Temp 99.2 F (37.3 C)  BP 106/79 mmHg  MAP (mmHg) 86  Pulse Rate 92  ECG Heart Rate ! 107  Resp ! 24  Oxygen Therapy  SpO2 ! 73 %  Art Line  Arterial Line BP 142/72 mmHg  Arterial Line MAP (mmHg) 90 mmHg  Art Line (2)  Arterial Line BP 2 109/74 mmHg  Arterial Line MAP (mmHg) 87 mmHg  Invasive Hemodynamic Monitoring  CVP (mmHg) 12 mmHg  Patient extubated at 1455 by RTto 4 L. At 1502, patient stopped breathing and eyes rolled back and he became gray in color. Patient did not lose rhythm. He was in [place] with 1 degree heart block and maintained SBP. Sats dropped to 73. Patient was ventilated by BVM. [name] notified and Dr. Roxy Manns in room at 1510 and [name] in room at 1515. Patient was placed back on ventilator. Wife was notified of patient's extubation and re-intubation @ 1600 via phone.

## 2013-10-29 NOTE — Progress Notes (Signed)
EKG CRITICAL VALUE     12 lead EKG performed.  Critical value noted. Trudi Ida , RN notified.   Fannie Gathright, CCT 10/29/2013 8:57 AM

## 2013-10-29 NOTE — Procedures (Signed)
Extubation Procedure Note  Patient Details:   Name: Bradley [name] DOB: 01-17-1956 MRN: [id number]   Airway Documentation:  Airway 8 mm (Active)  Secured at (cm) 24 cm 10/29/2013 12:00 PM  Measured From Lips 10/29/2013 12:00 PM  [employer] 10/29/2013 12:00 PM  Secured By [employer] 10/29/2013 12:00 PM  Tube Holder Repositioned Yes 10/29/2013 12:00 PM  Cuff Pressure (cm H2O) 28 cm H2O 10/29/2013  3:54 AM  Site Condition Dry 10/29/2013  3:54 AM    Evaluation  O2 sats: stable throughout Complications: No apparent complications Patient did tolerate procedure well. Bilateral Breath Sounds: Rhonchi (few scattered) Suctioning: Airway Yes  NIF -24, VC 1020, positive cuff leak   Hope Pigeon, MA 10/29/2013, 3:29 PM

## 2013-10-29 NOTE — Progress Notes (Signed)
NUTRITION FOLLOW UP  DOCUMENTATION CODES Per approved criteria  -Severe malnutrition in the context of chronic illness   INTERVENTION:  If TF started recommend:  Nepro formula -- initiate at 20 ml/hr and increase by 10 ml every 8 hours to goal rate of 40 ml/hr with Prostat liquid protein 30 ml QID to provide 2128 kcals, 138 gm protein, 698 ml of water  Monitor Mg, Phos, K+ levels RD to follow for nutrition care plan  NUTRITION DIAGNOSIS: Inadequate oral intake now related to inability to eat as evidenced by NPO status, ongoing  Goal: Intake to meet >90% of estimated nutrition needs, currently unmet  Monitor:  TF initiation & tolerance, respiratory status, weight, labs, I/O's  ASSESSMENT: PMHx significant for aortic stenosis, chronic LBBB, thyroid CA post thyroidectomy, ESRD (on HD), HTN, and depression. Recently discharged 3/13 after a one month stay for sepsis, MSSA bacteremia septic brain emboli and presumptive prosthetic valve infective endocarditis. New start to HD as of 2/21. Admitted with septic arthritis and sternal infection/osteomyelitis seeding from his previous bacteremia.  Patient s/p procedures 3/31: REDO STERNOTOMY  REDO BENTALL PROCEDURE WITH HOMOGRAFT DEBRIDEMENT OF ROOT ABSCESS  INTRAOPERATIVE TRANSESOPHAGEAL ECHOCARDIOGRAM  Patient transferred from 5W-Medical to 2S-Surgical ICU post-op.  Patient is currently intubated on ventilator support -- OGT in place MV: 10.6 L/min Temp (24hrs), Avg:98.8 F (37.1 C), Min:98.4 F (36.9 C), Max:99.2 F (37.3 C)   If prolonged intubation expected, recommend nutrition support initiation within next 24-48 hours.  Height: Ht Readings from Last 1 Encounters:  10/28/13 6' 1.5" (1.867 m)    Weight: Wt Readings from Last 1 Encounters:  10/29/13 203 lb 7.8 oz (92.3 kg)    BMI:  Body mass index is 26.48 kg/(m^2).  Re-estimated needs: Kcal: 2100-2250 Protein: 130-145 gm Fluid: per MD  Skin: surgical incisions    Diet Order: NPO   Intake/Output Summary (Last 24 hours) at 10/29/13 1431 Last data filed at 10/29/13 1300  Gross per 24 hour  Intake 7880.59 ml  Output   4215 ml  Net 3665.59 ml    Labs:   Recent Labs Lab 10/23/13 1411 10/26/13 0626 10/27/13 1833  10/28/13 2022 10/28/13 2223 10/29/13 0400  NA 136* 136* 136*  < > 136* 136* 135*  K 3.7 4.5 4.4  < > 4.8 4.3 5.6*  CL 97 97 96  --   --   --  101  CO2 28 27 27   --   --   --  20  BUN 12 5* 12  --   --   --  12  CREATININE 3.65* 2.42* 3.16*  --   --   --  2.37*  CALCIUM 8.4 8.4 8.5  --   --   --  9.3  MG  --   --   --   --   --   --  2.2  PHOS 3.9 2.6  --   --   --   --   --   GLUCOSE 114* 96 100*  < > 242* 125* 114*  < > = values in this interval not displayed.  CBG (last 3)   Recent Labs  10/29/13 0700 10/29/13 0804 10/29/13 1217  GLUCAP 127* 143* 116*    Scheduled Meds: . acetaminophen  1,000 mg Oral 4 times per day   Or  . acetaminophen (TYLENOL) oral liquid 160 mg/5 mL  1,000 mg Per Tube 4 times per day  . acetaminophen (TYLENOL) oral liquid 160 mg/5 mL  650 mg  Per Tube Once   Or  . acetaminophen  650 mg Rectal Once  . antiseptic oral rinse  15 mL Mouth Rinse q12n4p  . aspirin EC  325 mg Oral Daily   Or  . aspirin  324 mg Per Tube Daily  . bisacodyl  10 mg Oral Daily   Or  . bisacodyl  10 mg Rectal Daily  . chlorhexidine  15 mL Mouth Rinse BID  . docusate sodium  200 mg Oral Daily  . insulin regular  0-10 Units Intravenous TID WC  . levothyroxine  200 mcg Oral QAC breakfast  . metoprolol tartrate  12.5 mg Oral BID   Or  . metoprolol tartrate  12.5 mg Per Tube BID  . nafcillin IV  2 g Intravenous 6 times per day  . [START ON 10/30/2013] pantoprazole  40 mg Oral Daily  . rifampin (RIFADIN) IVPB  300 mg Intravenous 3 times per day  . sodium chloride  3 mL Intravenous Q12H    Continuous Infusions: . sodium chloride 20 mL/hr at 10/29/13 1349  . amiodarone (NEXTERONE PREMIX) 360 mg/200 mL dextrose 30  mg/hr (10/29/13 1021)  . dexmedetomidine Stopped (10/29/13 0730)  . DOPamine 3 mcg/kg/min (10/29/13 0800)  . insulin (NOVOLIN-R) infusion 0.6 Units/hr (10/29/13 1218)  . lactated ringers 20 mL/hr at 10/28/13 2150  . nitroGLYCERIN Stopped (10/28/13 2245)  . norepinephrine (LEVOPHED) Adult infusion Stopped (10/28/13 2200)  . phenylephrine (NEO-SYNEPHRINE) Adult infusion 35 mcg/min (10/29/13 1349)    Past Medical History  Diagnosis Date  . Anxiety   . Depression   . Hypertension   . Hypothyroidism, postsurgical   . Aortic stenosis     s/p Bentall with bioprosthetic AVR 02/2010; Last echo (9/11): Moderate LVH, EF 45-50%, AVR functioning appropriately, aortic valve mean gradient 21, diastolic dysfunction. Chest MRA (2/13): Mild to moderate dilatation at the sinus of Valsalva at 4.1 cm, mild dilatation ascending aorta distal to the tube graft at 3.9 cm, moderate dilatation of the innominate artery a 2.1 cm;    . Hx of cardiac cath     a. LHC in 02/2010: normal cors  . Hx of echocardiogram 2014    Echo (10/14): Severe LVH, EF 60-65%, normal wall motion, grade 1 diastolic dysfunction, AVR functioning normally, mild aortic stenosis (mean 19), moderately dilated aorta, mild LAE, mild RVE  . Staphylococcus aureus bacteremia   . Prosthetic valve endocarditis   . Thyroid cancer   . Heart murmur   . ESRD on dialysis     "[place]; TTS" (10/22/2013)    Past Surgical History  Procedure Laterality Date  . Cardiac catheterization  03/02/2010    NORMAL CORONARY ARTERY  . Sternotomy      REDO  . Transthoracic echocardiogram  03/2010    SHOWED MILD REDUCTION OF LV FUNCTION  . Thyroidectomy  ~ 2005  . Shoulder arthroscopy w/ rotator cuff repair Right 2012  . Av fistula placement Left 10/08/2013    Procedure: ARTERIOVENOUS (AV) FISTULA CREATION- LEFT ARM; Radial Cephalic ;  Surgeon: Mal [name], MD;  Location: [facility];  Service: Vascular;  Laterality: Left;  . Aortic valve replacement  2011  .  Cardiac valve replacement    . Aortic valve repair  1968  . Tee without cardioversion N/A 10/24/2013    Procedure: TRANSESOPHAGEAL ECHOCARDIOGRAM (TEE);  Surgeon: Dorothy Spark, MD;  Location: [facility];  Service: Cardiovascular;  Laterality: N/A;    Arthur Holms, RD, LDN Pager #: [phone] After-Hours Pager #: [phone]

## 2013-10-29 NOTE — Progress Notes (Addendum)
Patient ID: Bradley [name], male   DOB: 1955-08-06, 59 y.o.   MRN: [id number] Vent weaned, parameters for extubation adequate but following extubation was not breathing well and required reintubation. Chest xray shows tube in position, did not require cpr or chest compression Suspect still with neuromuscular block aid.  Also note preop patient has evidence of Severe malnutrition in the context of chronic illness   [name] MD      [address] [place] [address] Office [phone]   Beeper [phone]

## 2013-10-29 NOTE — Progress Notes (Addendum)
[facility] DAILY ICU PROGRESS NOTE                   [address]            [place] [address]          [phone]   1 Day Post-Op Procedure(s) (LRB): REDO STERNOTOMY (N/A) REDO BENTALL PROCEDURE, debridment of aoritc root abscess, replacement of aortic root, ascending aorta and aortic valve with homograft. Insertion of left femoral arterial line (N/A) INTRAOPERATIVE TRANSESOPHAGEAL ECHOCARDIOGRAM (N/A)  Total Length of Stay:  LOS: 7 days   Subjective: Sedated still on vent , responds to simple yes/no questions  Objective: Vital signs in last 24 hours: Temp:  [98.4 F (36.9 C)-99.2 F (37.3 C)] 98.5 F (36.9 C) (04/01 0700) Pulse Rate:  [79-116] 79 (04/01 0500) Cardiac Rhythm:  [-] Atrial paced (04/01 0200) Resp:  [11-17] 17 (04/01 0700) BP: (82-133)/(61-88) 107/81 mmHg (04/01 0700) SpO2:  [97 %-100 %] 99 % (04/01 0700) Arterial Line BP: (94-151)/(40-84) 131/80 mmHg (04/01 0700) FiO2 (%):  [40 %-50 %] 40 % (04/01 0354) Weight:  [203 lb 7.8 oz (92.3 kg)] 203 lb 7.8 oz (92.3 kg) (04/01 0500)  Filed Weights   10/27/13 1830 10/28/13 0453 10/29/13 0500  Weight: 186 lb 8.2 oz (84.6 kg) 181 lb 14.1 oz (82.5 kg) 203 lb 7.8 oz (92.3 kg)    Vent Mode:  [-] SIMV;PRVC;PSV FiO2 (%):  [40 %-50 %] 40 % Set Rate:  [12 bmp] 12 bmp Vt Set:  [700 mL] 700 mL PEEP:  [5 cmH20] 5 cmH20 Pressure Support:  [10 cmH20] 10 cmH20 Plateau Pressure:  [22 cmH20-23 cmH20] 22 cmH20  Weight change: 16 lb 15.6 oz (7.7 kg)   Hemodynamic parameters for last 24 hours: CVP:  [15 mmHg-19 mmHg] 15 mmHg  Intake/Output from previous day: 03/31 0701 - 04/01 0700 In: 10781.7 [I.V.:5815.9; Blood:3665.8; IV Piggyback:1300] Out: [id number] [Urine:1130; Blood:3000; Chest Tube:390]  Intake/Output this shift:    Current Meds: Scheduled Meds: . acetaminophen  1,000 mg Oral 4 times per day   Or  . acetaminophen (TYLENOL) oral liquid 160 mg/5 mL  1,000 mg Per Tube 4 times per day  . acetaminophen  (TYLENOL) oral liquid 160 mg/5 mL  650 mg Per Tube Once   Or  . acetaminophen  650 mg Rectal Once  . aspirin EC  325 mg Oral Daily   Or  . aspirin  324 mg Per Tube Daily  . bisacodyl  10 mg Oral Daily   Or  . bisacodyl  10 mg Rectal Daily  . cefUROXime (ZINACEF)  IV  1.5 g Intravenous Q12H  . docusate sodium  200 mg Oral Daily  . famotidine (PEPCID) IV  20 mg Intravenous Q12H  . insulin regular  0-10 Units Intravenous TID WC  . levofloxacin (LEVAQUIN) IV  500 mg Intravenous Q48H  . levothyroxine  200 mcg Oral QAC breakfast  . metoprolol tartrate  12.5 mg Oral BID   Or  . metoprolol tartrate  12.5 mg Per Tube BID  . [START ON 10/30/2013] pantoprazole  40 mg Oral Daily  . sodium chloride  3 mL Intravenous Q12H   Continuous Infusions: . sodium chloride 20 mL/hr at 10/28/13 2200  . amiodarone (NEXTERONE PREMIX) 360 mg/200 mL dextrose 30 mg/hr (10/28/13 2300)  . dexmedetomidine 0.1 mcg/kg/hr (10/29/13 0600)  . DOPamine 3 mcg/kg/min (10/29/13 0130)  . insulin (NOVOLIN-R) infusion 0.1 Units/hr (10/29/13 0700)  . lactated ringers 20 mL/hr at 10/28/13  2150  . nitroGLYCERIN Stopped (10/28/13 2245)  . norepinephrine (LEVOPHED) Adult infusion Stopped (10/28/13 2200)  . phenylephrine (NEO-SYNEPHRINE) Adult infusion 35 mcg/min (10/29/13 0515)   PRN Meds:.albumin human, fentaNYL, metoprolol, midazolam, ondansetron (ZOFRAN) IV, sodium chloride, traMADol  General appearance: fatigued and no distress Heart: regular rate and rhythm Lungs: clear anteriorly Abdomen: soft, nondistended, no guarding Extremities: + edema Wound: dressings intact, some blood on sternal dressing neuro: moves extrem to command, weak response, sedated  Lab Results: CBC: Recent Labs  10/28/13 2220 10/28/13 2223 10/29/13 0400  WBC 37.9*  --  35.9*  HGB 10.5* 11.2* 10.8*  HCT 32.2* 33.0* 32.2*  PLT 121*  --  129*   BMET:  Recent Labs  10/27/13 1833  10/28/13 2223 10/29/13 0400  NA 136*  < > 136* 135*  K  4.4  < > 4.3 5.6*  CL 96  --   --  101  CO2 27  --   --  20  GLUCOSE 100*  < > 125* 114*  BUN 12  --   --  12  CREATININE 3.16*  --   --  2.37*  CALCIUM 8.5  --   --  9.3  < > = values in this interval not displayed.  PT/INR:  Recent Labs  10/28/13 2220  LABPROT 13.0  INR 1.00   ABG    Component Value Date/Time   PHART 7.356 10/29/2013 0413   PCO2ART 38.8 10/29/2013 0413   PO2ART 91.0 10/29/2013 0413   HCO3 21.7 10/29/2013 0413   TCO2 23 10/29/2013 0413   ACIDBASEDEF 3.0* 10/29/2013 0413   O2SAT 97.0 10/29/2013 0413     Radiology: Dg Chest 1 View  10/27/2013   CLINICAL DATA:  Preop aortic valve replacement  EXAM: CHEST - 1 VIEW  COMPARISON:  CT HEART MORPH/PULM VEIN W/CM&W/O CA SCORE dated 10/26/2013; DG CHEST 1V PORT dated 09/18/2013  FINDINGS: Mild to moderate cardiac enlargement. Prominence of the aortic arch. Status post median sternotomy. Replacement cardiac aortic valve noted. Vascular pattern is normal. Lungs are clear except for mild scarring or atelectasis at the left base. There is a dual-lumen central line on the right with tips projecting over the right atrium.  IMPRESSION: No acute findings.   Electronically Signed   By: Skipper Cliche M.D.   On: 10/27/2013 17:32   Dg Chest Portable 1 View  10/28/2013   CLINICAL DATA:  Postop, check support apparatus  EXAM: PORTABLE CHEST - 1 VIEW  COMPARISON:  Prior chest x-ray earlier today at 8:08 a.m.;  FINDINGS: Endotracheal tube terminates 3.6 cm above the carina. Stable position of right IJ approach hemodialysis catheter with tips projecting over the right atrium. Nasogastric tube is incompletely imaged. The tip lies below the diaphragm presumably within the stomach. Bilateral chest tubes are now present. Unchanged position of what appears to a left IJ vascular sheath and a left IJ central venous catheter. The tip of the catheter is folded upon itself in overlies the left upper lung. The position of this catheter is uncertain, however are remains  unchanged. Stable cardiomegaly and mediastinal widening. No pneumothorax. Low inspiratory volumes with bibasilar atelectasis.  IMPRESSION: 1. Unchanged position of the left IJ approach central venous catheters. The catheter tip remains fold upon itself and an on certain location. If intravenous, it is likely within the subclavian vein. 2. Status post redo median sternotomy. New bilateral chest tubes and mediastinal drain. 3. Other support apparatus in stable and satisfactory position. 4. No significant interval change  in the appearance of the chest.   Electronically Signed   By: Jacqulynn Cadet M.D.   On: 10/28/2013 22:28   Dg Chest Portable 1 View  10/28/2013   CLINICAL DATA:  Status post line placement  EXAM: PORTABLE CHEST - 1 VIEW  COMPARISON:  DG CHEST 1 VIEW dated 10/27/2013  FINDINGS: There is a catheter like structure that projects in the left internal jugular region but the terminus of this catheter is unclear. It appears to coil upon itself and the tip redirected superiorly outside the margin of the image. The loop overlies the anterior aspect of the left second rib. There is no postprocedure pneumothorax. The endotracheal tube tip lies approximately 4.7 cm above the crotch of the carina. The dual-lumen dialysis type catheter placed via the right subclavian approach is unchanged. The esophagogastric tube tip and proximal port project off the inferior margin of the image. The lungs are mildly hypoinflated. There is retrocardiac atelectasis on the left. The cardiac silhouette is enlarged. There is a prosthetic aortic valve.  IMPRESSION: The positioning of the internal jugular catheter on the left is not adequate. The catheter may a folded upon itself and redirected its tip superiorly. Removal and replacement is recommended. These results were called by telephone at the time of interpretation on 10/28/2013 at 8:23 AM to Dineen Kid RN, who verbally acknowledged these results.   Electronically Signed    By: David  Martinique   On: 10/28/2013 08:26   Dg C-arm 1-60 Min-no Report  10/28/2013   CLINICAL DATA: line placement   C-ARM 1-60 MINUTES  Fluoroscopy was utilized by the requesting physician.  No radiographic  interpretation.      Assessment/Plan: S/P Procedure(s) (LRB): REDO STERNOTOMY (N/A) REDO BENTALL PROCEDURE, debridment of aoritc root abscess, replacement of aortic root, ascending aorta and aortic valve with homograft. Insertion of left femoral arterial line (N/A) INTRAOPERATIVE TRANSESOPHAGEAL ECHOCARDIOGRAM (N/A)  1 stable on vent- should be able to proceed with weaning today 2 hemodyn stable on dopamine 3 mcg/kg/min, no swan ganz. CVP elevated at 15. Nephrology will manage dialysis issues. Actually making good UO and creat is down to 2.3. Watch K+ closely 3 H/H stable- not actively bleeding 4 EKG concerning for inferior injury- will check enzymes 5 CXR remarkably clear 6 on amio gtt for vtach/fib in OR requiring multiple shocks- rhythm has been very stable in unit. Apacing currently    GOLD,WAYNE E 10/29/2013 7:33 AM  Expected Acute  Blood - loss Anemia further culture from or pending Continue antibiotics therapy per id I have seen and examined [name] and agree with the above assessment  and plan.  [name] MD Beeper [phone] Office [phone] 10/29/2013 9:34 AM

## 2013-10-29 NOTE — Progress Notes (Signed)
[facility] BRIEF SICU PROGRESS NOTE  1 Day Post-Op  S/P Procedure(s) (LRB): REDO STERNOTOMY (N/A) REDO BENTALL PROCEDURE, debridment of aoritc root abscess, replacement of aortic root, ascending aorta and aortic valve with homograft. Insertion of left femoral arterial line (N/A) INTRAOPERATIVE TRANSESOPHAGEAL ECHOCARDIOGRAM (N/A)   Intubated and sedated on vent, reintubated earlier due to acute resp failure shortly after extubation w/ likely retained paralytic agent on board  AV paced, BP still a bit labile but currently MAP 60-65 on low dose levophed, neo and dopamine  Extremities cool but adequate perfusion  ABG    Component Value Date/Time   PHART 7.432 10/29/2013 1536   PCO2ART 33.7* 10/29/2013 1536   PO2ART 200.0* 10/29/2013 1536   HCO3 22.5 10/29/2013 1536   TCO2 22 10/29/2013 1601   ACIDBASEDEF 1.0 10/29/2013 1536   O2SAT 100.0 10/29/2013 1536    Plan: Continue current plan  Bradley [name] 10/29/2013 7:14 PM

## 2013-10-29 NOTE — Code Documentation (Signed)
  Patient Name: Bradley [name]   MRN: [id number]   Date of Birth/ Sex: [birth year] , male      Admission Date: 10/22/2013  Attending Provider: [name], MD  Primary Diagnosis: <principal problem not specified>   Indication: Pt was extubated at 2:50pm at 2:55pm he developed respiratory distress and became hypoxic turning blue per nursing staff.  A code blue was called respiratory started bag mask ventilation of the patient.  On our arrival at patient was in sinus tachycardia with a BP via A-line of ~150/90.  He was minimally responsive.   Technical Description:  - CPR performance duration:  none  - Was defibrillation or cardioversion used? No   - Was external pacer placed? No  - Was patient intubated pre/post CPR? Yes- intubated but no CPR   Medications Administered: Y = Yes; Blank = No Amiodarone    Atropine    Calcium    Epinephrine    Lidocaine    Magnesium    Norepinephrine    Phenylephrine    Sodium bicarbonate    Vasopressin     Post CPR evaluation:  - Final Status - Was patient successfully resuscitated ? Yes - What is current rhythm? Sinus Tachycardic - What is current hemodynamic status? Hypotensive  Miscellaneous Information:  - Labs sent, including: APTT, BMP, CBC, Lactic acid, Mag, Phos PT-INR  - Primary team notified?  Yes- [name] at bedside  - Family Notified? No  - Additional notes/ transfer status: Transferred care to Attending Dr. Milbert Coulter, DO  10/29/2013, 3:25 PM

## 2013-10-29 NOTE — Progress Notes (Signed)
Patient ID: MAKO ZHAO, male   DOB: [birth year], 58 y.o.   MRN: [id number]   [facility] Progress Note   Assessment/ Plan:   1 Paravalvular abscess/Prosthetic valve endocarditis: on cefazolin, hemodynamically stable and today is 1 day post debridement of aortic root abscess, replacement of aortic root, ascending aorta and aortic valve with homograft. Had V-fib arrest while getting off pump needing cardioversion. Currently intubated and hemodynamically stable on dual pressors- anticipated weaning from ventilator later today.    2 ESRD on hemodialysis- had AKI from gent / ATN vs immune complex and remains dialysis dependent >6weeks- no signs of recovery. Borderline hyperkalemia noted- will plan for HD today v/s tomorrow after discussion with TCTS. CVP 14cm. 3 Recent MSSA bacteremia w septic cerebral emboli / endocarditis: now s/p debridement of aortic root abscess and root replacement with homograft. 4 Anemia Hb 10 on darbe 100/wk - re-dose iron as he continues to improve 5 HTN/volume- on pressors, appears to be currently stable  Subjective:   Overnight events noted- increased responsiveness this morning after sedation turned off earlier.   Objective:   BP 107/81  Pulse 79  Temp(Src) 98.5 F (36.9 C) (Oral)  Resp 17  Ht 6' 1.5" (1.867 m)  Wt 92.3 kg (203 lb 7.8 oz)  BMI 26.48 kg/m2  SpO2 99%  Physical Exam: [id number], attempts to open eyes when name called out [id number] RRR, Muffled S2 with normal S1 Resp:Coarse BS bilaterally, no rales/rhonchi [id number], flat, NT, BS normal [id number] LE edema  Labs: BMET  Recent Labs Lab 10/22/13 2226 10/23/13 1411 10/26/13 0626 10/27/13 1833  10/28/13 1834 10/28/13 1857 10/28/13 1913 10/28/13 1957 10/28/13 2022 10/28/13 2223 10/29/13 0400  NA 135* 136* 136* 136*  < > 132* 133* 137 135* 136* 136* 135*  K 3.7 3.7 4.5 4.4  < > 6.3* 6.1* 5.3 5.1 4.8 4.3 5.6*  CL 96 97 97 96  --   --   --   --   --   --   --  101   CO2 25 28 27 27   --   --   --   --   --   --   --  20  GLUCOSE 116* 114* 96 100*  < > 299* 354* 339* 291* 242* 125* 114*  BUN 13 12 5* 12  --   --   --   --   --   --   --  12  CREATININE 4.19* 3.65* 2.42* 3.16*  --   --   --   --   --   --   --  2.37*  CALCIUM 8.7 8.4 8.4 8.5  --   --   --   --   --   --   --  9.3  PHOS  --  3.9 2.6  --   --   --   --   --   --   --   --   --   < > = values in this interval not displayed. CBC  Recent Labs Lab 10/26/13 0626 10/27/13 1843  10/28/13 1736  10/28/13 2015 10/28/13 2022 10/28/13 2220 10/28/13 2223 10/29/13 0400  WBC 13.8* 13.5*  --   --   --   --   --  37.9*  --  35.9*  HGB 10.0* 10.0*  < > 8.1*  < >  --  8.8* 10.5* 11.2* 10.8*  HCT 30.9* 31.0*  < > 24.2*  < >  --  26.0* 32.2* 33.0* 32.2*  MCV 92.2 93.9  --   --   --   --   --  87.0  --  86.6  PLT 249 257  --  PLATELET CLUMPS NOTED ON SMEAR, COUNT APPEARS DECREASED  --  40*  --  121*  --  129*  < > = values in this interval not displayed.  @IMGRELPRIORS @ Medications:    . acetaminophen  1,000 mg Oral 4 times per day   Or  . acetaminophen (TYLENOL) oral liquid 160 mg/5 mL  1,000 mg Per Tube 4 times per day  . acetaminophen (TYLENOL) oral liquid 160 mg/5 mL  650 mg Per Tube Once   Or  . acetaminophen  650 mg Rectal Once  . aspirin EC  325 mg Oral Daily   Or  . aspirin  324 mg Per Tube Daily  . bisacodyl  10 mg Oral Daily   Or  . bisacodyl  10 mg Rectal Daily  . cefUROXime (ZINACEF)  IV  1.5 g Intravenous Q12H  . docusate sodium  200 mg Oral Daily  . famotidine (PEPCID) IV  20 mg Intravenous Q12H  . insulin regular  0-10 Units Intravenous TID WC  . levofloxacin (LEVAQUIN) IV  500 mg Intravenous Q48H  . levothyroxine  200 mcg Oral QAC breakfast  . metoprolol tartrate  12.5 mg Oral BID   Or  . metoprolol tartrate  12.5 mg Per Tube BID  . [START ON 10/30/2013] pantoprazole  40 mg Oral Daily  . sodium chloride  3 mL Intravenous Q12H   Elmarie Shiley, MD 10/29/2013, 8:16 AM

## 2013-10-29 NOTE — Progress Notes (Signed)
Patient reaching for ETT. Precedex at 0.6 mcg and has had fentanyl for pain. Wanting to go home and not wanting to keep hands at side as requested and was given reasons. Dr. Roxy Manns notified and order received for restraints. Explained to patient why they were being placed and he nodded agreement. Will explain to family when they visit at 2000.

## 2013-10-30 ENCOUNTER — Inpatient Hospital Stay (HOSPITAL_COMMUNITY): Payer: Medicaid Other

## 2013-10-30 LAB — COMPREHENSIVE METABOLIC PANEL
ALT: 93 U/L — ABNORMAL HIGH (ref 0–53)
AST: 407 U/L — ABNORMAL HIGH (ref 0–37)
Albumin: 2.4 g/dL — ABNORMAL LOW (ref 3.5–5.2)
Alkaline Phosphatase: 64 U/L (ref 39–117)
BUN: 20 mg/dL (ref 6–23)
CO2: 18 mEq/L — ABNORMAL LOW (ref 19–32)
Calcium: 9 mg/dL (ref 8.4–10.5)
Chloride: 97 mEq/L (ref 96–112)
Creatinine, Ser: 3.62 mg/dL — ABNORMAL HIGH (ref 0.50–1.35)
GFR calc Af Amer: 20 mL/min — ABNORMAL LOW (ref >90)
GFR calc non Af Amer: 17 mL/min — ABNORMAL LOW (ref >90)
Glucose, Bld: 140 mg/dL — ABNORMAL HIGH (ref 70–99)
Potassium: 5.6 mEq/L — ABNORMAL HIGH (ref 3.7–5.3)
Sodium: 137 mEq/L (ref 137–147)
Total Bilirubin: 0.8 mg/dL (ref 0.3–1.2)
Total Protein: 5.3 g/dL — ABNORMAL LOW (ref 6.0–8.3)

## 2013-10-30 LAB — GLUCOSE, CAPILLARY
Glucose-Capillary: 102 mg/dL — ABNORMAL HIGH (ref 70–99)
Glucose-Capillary: 103 mg/dL — ABNORMAL HIGH (ref 70–99)
Glucose-Capillary: 111 mg/dL — ABNORMAL HIGH (ref 70–99)
Glucose-Capillary: 111 mg/dL — ABNORMAL HIGH (ref 70–99)
Glucose-Capillary: 134 mg/dL — ABNORMAL HIGH (ref 70–99)
Glucose-Capillary: 84 mg/dL (ref 70–99)
Glucose-Capillary: 85 mg/dL (ref 70–99)
Glucose-Capillary: 86 mg/dL (ref 70–99)
Glucose-Capillary: 89 mg/dL (ref 70–99)
Glucose-Capillary: 97 mg/dL (ref 70–99)
Glucose-Capillary: 98 mg/dL (ref 70–99)
Glucose-Capillary: 99 mg/dL (ref 70–99)

## 2013-10-30 LAB — CBC
HCT: 26.3 % — ABNORMAL LOW (ref 39.0–52.0)
Hemoglobin: 8.7 g/dL — ABNORMAL LOW (ref 13.0–17.0)
MCH: 28.3 pg (ref 26.0–34.0)
MCHC: 33.1 g/dL (ref 30.0–36.0)
MCV: 85.7 fL (ref 78.0–100.0)
Platelets: 117 10*3/uL — ABNORMAL LOW (ref 150–400)
RBC: 3.07 MIL/uL — ABNORMAL LOW (ref 4.22–5.81)
RDW: 21.7 % — ABNORMAL HIGH (ref 11.5–15.5)
WBC: 20.2 10*3/uL — ABNORMAL HIGH (ref 4.0–10.5)

## 2013-10-30 LAB — PREPARE RBC (CROSSMATCH)

## 2013-10-30 MED ORDER — GENTAMICIN SULFATE 40 MG/ML IJ SOLN
120.0000 mg | Freq: Once | INTRAVENOUS | Status: AC
  Administered 2013-10-30: 120 mg via INTRAVENOUS
  Filled 2013-10-30: qty 3

## 2013-10-30 MED ORDER — GENTAMICIN IN SALINE 1-0.9 MG/ML-% IV SOLN
100.0000 mg | INTRAVENOUS | Status: DC
  Filled 2013-10-30: qty 100

## 2013-10-30 MED ORDER — ALBUMIN HUMAN 5 % IV SOLN
12.5000 g | Freq: Once | INTRAVENOUS | Status: AC
  Administered 2013-10-30: 12.5 g via INTRAVENOUS

## 2013-10-30 MED ORDER — GENTAMICIN IN SALINE 1.6-0.9 MG/ML-% IV SOLN
80.0000 mg | Freq: Once | INTRAVENOUS | Status: AC
  Administered 2013-10-30: 80 mg via INTRAVENOUS
  Filled 2013-10-30: qty 50

## 2013-10-30 MED ORDER — GENTAMICIN IN SALINE 1.6-0.9 MG/ML-% IV SOLN
80.0000 mg | INTRAVENOUS | Status: DC | PRN
  Filled 2013-10-30: qty 50

## 2013-10-30 MED ORDER — ALBUMIN HUMAN 5 % IV SOLN
INTRAVENOUS | Status: AC
  Filled 2013-10-30: qty 250

## 2013-10-30 MED FILL — Albumin, Human Inj 5%: INTRAVENOUS | Qty: 250 | Status: AC

## 2013-10-30 MED FILL — Medication: Qty: 1 | Status: AC

## 2013-10-30 MED FILL — Sodium Chloride Irrigation Soln 0.9%: Qty: 3000 | Status: AC

## 2013-10-30 MED FILL — Sodium Chloride IV Soln 0.9%: INTRAVENOUS | Qty: 7000 | Status: AC

## 2013-10-30 MED FILL — Heparin Sodium (Porcine) Inj 1000 Unit/ML: INTRAMUSCULAR | Qty: 30 | Status: AC

## 2013-10-30 MED FILL — Lidocaine HCl IV Inj 20 MG/ML: INTRAVENOUS | Qty: 15 | Status: AC

## 2013-10-30 MED FILL — Calcium Chloride Inj 10%: INTRAVENOUS | Qty: 20 | Status: AC

## 2013-10-30 MED FILL — Sodium Bicarbonate IV Soln 8.4%: INTRAVENOUS | Qty: 150 | Status: AC

## 2013-10-30 MED FILL — Mannitol IV Soln 20%: INTRAVENOUS | Qty: 500 | Status: AC

## 2013-10-30 MED FILL — Electrolyte-R (PH 7.4) Solution: INTRAVENOUS | Qty: 4000 | Status: AC

## 2013-10-30 NOTE — Progress Notes (Signed)
Patient ID: Bradley [name], male   DOB: 1956/05/06, 58 y.o.   MRN: [id number]   [facility] Progress Note   Assessment/ Plan:   1 Paravalvular abscess/Prosthetic valve endocarditis: on nafcillin/RFM/Gent, hemodynamically stable and today is his 2nd day post debridement of aortic root abscess, replacement of aortic root, ascending aorta and aortic valve with homograft. Re-intubated and remains on pressors- anticipated continued weaning from ventilator.  2 ESRD on hemodialysis- had AKI from gent / ATN vs immune complex and remains dialysis dependent >6weeks- no signs of recovery. Borderline hyperkalemia noted- will plan for HD today with limited ultrafiltration while on pressors. CVP 11-14cm.  3 Recent MSSA bacteremia w septic cerebral emboli / endocarditis: now s/p debridement of aortic root abscess and root replacement with homograft. On ABx. 4 Anemia Hb 10 on darbe 100/wk - re-dose iron as he continues to improve  5 HTN/volume- on pressors, appears to be hypervolemic (3rd space) on exam  Subjective:   Overnight events noted with need for re-intubation shortly after extubation yesterday   Objective:   BP 95/67  Pulse 85  Temp(Src) 99 F (37.2 C) (Oral)  Resp 16  Ht 6' 1.5" (1.867 m)  Wt 94 kg (207 lb 3.7 oz)  BMI 26.97 kg/m2  SpO2 100%  Physical Exam: [id number], eyes open and tracks around room/blinks in acknowledgement [id number] RRR, normal S1 and muffled S2 Resp:Coarse BS bilaterally, no rales/rhonchi [id number], flat, NT, BS normal [id number] LE edema  Labs: BMET  Recent Labs Lab 10/23/13 1411 10/26/13 0626 10/27/13 1833  10/28/13 2022 10/28/13 2223 10/29/13 0400 10/29/13 1508 10/29/13 1514 10/29/13 1600 10/29/13 1601 10/30/13 0400  NA 136* 136* 136*  < > 136* 136* 135* 137 134*  --  135* 137  K 3.7 4.5 4.4  < > 4.8 4.3 5.6* 6.3* 5.7*  --  4.4 5.6*  CL 97 97 96  --   --   --  101 97 102  --  102 97  CO2 28 27 27   --   --   --  20 21  --   --   --   18*  GLUCOSE 114* 96 100*  < > 242* 125* 114* 159* 156*  --  150* 140*  BUN 12 5* 12  --   --   --  12 15 17   --  14 20  CREATININE 3.65* 2.42* 3.16*  --   --   --  2.37* 2.92* 3.20* 2.97* 3.40* 3.62*  CALCIUM 8.4 8.4 8.5  --   --   --  9.3 9.3  --   --   --  9.0  PHOS 3.9 2.6  --   --   --   --   --  5.5*  --   --   --   --   < > = values in this interval not displayed. CBC  Recent Labs Lab 10/29/13 0400 10/29/13 1508 10/29/13 1514 10/29/13 1600 10/29/13 1601 10/30/13 0400  WBC 35.9* 26.7*  --  20.1*  --  20.2*  HGB 10.8* 10.7* 10.9* 9.8* 10.2* 8.7*  HCT 32.2* 33.0* 32.0* 30.0* 30.0* 26.3*  MCV 86.6 88.7  --  86.7  --  85.7  PLT 129* 130*  --  123*  --  117*   Medications:    . acetaminophen  650 mg Rectal 4 times per day  . antiseptic oral rinse  15 mL Mouth Rinse q12n4p  . aspirin EC  325 mg Oral Daily  Or  . aspirin  324 mg Per Tube Daily  . bisacodyl  10 mg Oral Daily   Or  . bisacodyl  10 mg Rectal Daily  . chlorhexidine  15 mL Mouth Rinse BID  . docusate sodium  200 mg Oral Daily  . insulin aspart  0-24 Units Subcutaneous 6 times per day  . levothyroxine  200 mcg Oral QAC breakfast  . metoprolol tartrate  12.5 mg Oral BID   Or  . metoprolol tartrate  12.5 mg Per Tube BID  . nafcillin IV  2 g Intravenous 6 times per day  . pantoprazole  40 mg Oral Daily  . rifampin (RIFADIN) IVPB  300 mg Intravenous 3 times per day  . sodium chloride  3 mL Intravenous Q12H   Elmarie Shiley, MD 10/30/2013, 7:19 AM

## 2013-10-30 NOTE — Progress Notes (Signed)
Bradley [name] is a 58 y.o. male who has been consulted for aminoglycoside dosing.  The patient has a current serum creatinine of  Creatinine, Ser (mg/dL)  Date Value  10/29/2013 3.40*  ESRD on HD, kidneys not expected to recover, now making good UO.  Current weight is 92.3kg  The patient will be started on gentamicin at a dose of 1 mg/kg ideal body weight.  Patient will be followed by pharmacy for changes in renal function, toxicity, and efficacy.    ID is in consult w/ pt, has needed three OHS for endocarditis of prosthetic valve, now s/p 6wk of ABX, now w/ paravalvular absess s/p debridement, plan for another 6wk of ABX w/ 2wk of gent.  Benefit of gent thought by ID to exceed risk.  Wynona Neat, PharmD, BCPS 10/30/2013 12:53 AM

## 2013-10-30 NOTE — Progress Notes (Signed)
CT surgery p.m. Rounds  Patient tolerated hemodialysis well today Continues to make 10-20 cc urine per hour We'll plan on extubation in early a.m. since patient required reintubation after initial postop extubation Continue current care

## 2013-10-30 NOTE — Progress Notes (Signed)
ANTIBIOTIC CONSULT NOTE - FOLLOW UP  Pharmacy Consult for gentamicin Indication: MSSA bacteremia/endocarditis  Allergies  Allergen Reactions  . Oxycodone     Gives patient nightmares  . Rifampin Nausea Only    Patient Measurements: Height: 6' 1.5" (186.7 cm) Weight: 207 lb 3.7 oz (94 kg) IBW/kg (Calculated) : 81.05   Vital Signs: Temp: 99.7 F (37.6 C) (04/02 1000) Temp src: Core (Comment) (04/02 1000) BP: 96/66 mmHg (04/02 1000) Pulse Rate: 84 (04/02 1000) Intake/Output from previous day: 04/01 0701 - 04/02 0700 In: 3521.8 [I.V.:2251.8; NG/GT:120; IV Piggyback:1150] Out: 505 [Urine:195; Chest Tube:310] Intake/Output from this shift: Total I/O In: 560.7 [I.V.:235.7; Blood:275; IV Piggyback:50] Out: 35 [Urine:35]  Labs:  Recent Labs  10/29/13 1508  10/29/13 1600 10/29/13 1601 10/30/13 0400  WBC 26.7*  --  20.1*  --  20.2*  HGB 10.7*  < > 9.8* 10.2* 8.7*  PLT 130*  --  123*  --  117*  CREATININE 2.92*  < > 2.97* 3.40* 3.62*  < > = values in this interval not displayed. Estimated Creatinine Clearance: 26.1 ml/min (by C-G formula based on Cr of 3.62). No results found for this basename: VANCOTROUGH, Corlis Leak, VANCORANDOM, Gramling, GENTPEAK, GENTRANDOM, TOBRATROUGH, TOBRAPEAK, TOBRARND, AMIKACINPEAK, AMIKACINTROU, AMIKACIN,  in the last 72 hours   Microbiology: Recent Results (from the past 720 hour(s))  CLOSTRIDIUM DIFFICILE BY PCR     Status: None   Collection Time    10/06/13  4:49 AM      Result Value Ref Range Status   C difficile by pcr NEGATIVE  NEGATIVE Final  CLOSTRIDIUM DIFFICILE BY PCR     Status: None   Collection Time    10/22/13 10:04 PM      Result Value Ref Range Status   C difficile by pcr NEGATIVE  NEGATIVE Final  CULTURE, BLOOD (ROUTINE X 2)     Status: None   Collection Time    10/22/13 10:26 PM      Result Value Ref Range Status   Specimen Description BLOOD RIGHT ARM   Final   Special Requests BOTTLES DRAWN AEROBIC ONLY 3CC   Final    Culture  Setup Time     Final   Value: 10/23/2013 04:42     Performed at [facility]   Culture     Final   Value: NO GROWTH 5 DAYS     Performed at [facility]   Report Status 10/29/2013 FINAL   Final  CULTURE, BLOOD (ROUTINE X 2)     Status: None   Collection Time    10/22/13 10:34 PM      Result Value Ref Range Status   Specimen Description BLOOD RIGHT HAND   Final   Special Requests BOTTLES DRAWN AEROBIC ONLY [facility]   Final   Culture  Setup Time     Final   Value: 10/23/2013 04:42     Performed at [facility]   Culture     Final   Value: NO GROWTH 5 DAYS     Performed at [facility]   Report Status 10/29/2013 FINAL   Final  SURGICAL PCR SCREEN     Status: None   Collection Time    10/27/13  1:13 PM      Result Value Ref Range Status   MRSA, PCR NEGATIVE  NEGATIVE Final   Staphylococcus aureus NEGATIVE  NEGATIVE Final   Comment:            The Xpert  SA Assay (FDA     approved for NASAL specimens     in patients over 74 years of age),     is one component of     a comprehensive surveillance     program.  Test performance has     been validated by [employer] for patients greater     than or equal to 36 year old.     It is not intended     to diagnose infection nor to     guide or monitor treatment.  TISSUE CULTURE     Status: None   Collection Time    10/28/13  1:20 PM      Result Value Ref Range Status   Specimen Description TISSUE AORTA   Final   Special Requests PT ON VANCOMYCIN AND ZINACEF   Final   Gram Stain     Final   Value: RARE WBC PRESENT,BOTH PMN AND MONONUCLEAR     NO ORGANISMS SEEN     Performed at [facility]   Culture     Final   Value: NO GROWTH 2 DAYS     Performed at [facility]   Report Status PENDING   Incomplete  GRAM STAIN     Status: None   Collection Time    10/28/13  2:26 PM      Result Value Ref Range Status   Specimen Description ABSCESS AORTA   Final   Special Requests PT  ON VANCOMYCIN AND ZINACEF   Final   Gram Stain     Final   Value: RARE WBC PRESENT, PREDOMINANTLY PMN     NO ORGANISMS SEEN   Report Status 10/28/2013 FINAL   Final  ANAEROBIC CULTURE     Status: None   Collection Time    10/28/13  2:26 PM      Result Value Ref Range Status   Specimen Description ABSCESS AORTA   Final   Special Requests PT ON VANCOMYCIN AND ZINACEF   Final   Gram Stain     Final   Value: RARE WBC PRESENT,BOTH PMN AND MONONUCLEAR     NO ORGANISMS SEEN     Performed at [facility]   Culture     Final   Value: NO ANAEROBES ISOLATED; CULTURE IN PROGRESS FOR 5 DAYS     Performed at [facility]   Report Status PENDING   Incomplete  CULTURE, ROUTINE-ABSCESS     Status: None   Collection Time    10/28/13  2:26 PM      Result Value Ref Range Status   Specimen Description ABSCESS AORTA   Final   Special Requests PT ON VANCOMYCIN AND ZINACEF   Final   Gram Stain     Final   Value: RARE WBC PRESENT, PREDOMINANTLY PMN     NO SQUAMOUS EPITHELIAL CELLS SEEN     NO ORGANISMS SEEN     Performed at [facility]     Performed at [facility]   Culture     Final   Value: NO GROWTH 2 DAYS     Performed at [facility]   Report Status PENDING   Incomplete    Anti-infectives   Start     Dose/Rate Route Frequency Ordered Stop   10/31/13 1200  gentamicin (GARAMYCIN) IVPB 80 mg  Status:  Discontinued     80 mg 100 mL/hr over 30 Minutes Intravenous Every Dialysis  10/30/13 0047 10/30/13 1027   10/30/13 0500  gentamicin (GARAMYCIN) IVPB 80 mg     80 mg 100 mL/hr over 30 Minutes Intravenous  Once 10/30/13 0047 10/30/13 0526   10/29/13 1600  levofloxacin (LEVAQUIN) IVPB 500 mg  Status:  Discontinued     500 mg 100 mL/hr over 60 Minutes Intravenous Every 48 hours 10/27/13 1457 10/29/13 0857   10/29/13 1000  rifampin (RIFADIN) 300 mg in sodium chloride 0.9 % 100 mL IVPB     300 mg 200 mL/hr over 30 Minutes Intravenous 3 times per day 10/29/13  0858     10/29/13 1000  nafcillin 2 g in dextrose 5 % 50 mL IVPB     2 g 100 mL/hr over 30 Minutes Intravenous 6 times per day 10/29/13 0901     10/29/13 0900  nafcillin injection 2 g  Status:  Discontinued     2 g Intravenous Every 4 hours 10/29/13 0857 10/29/13 0900   10/29/13 0500  vancomycin (VANCOCIN) IVPB 1000 mg/200 mL premix     1,000 mg 200 mL/hr over 60 Minutes Intravenous  Once 10/28/13 2146 10/29/13 0600   10/29/13 0100  cefUROXime (ZINACEF) 1.5 g in dextrose 5 % 50 mL IVPB  Status:  Discontinued     1.5 g 100 mL/hr over 30 Minutes Intravenous Every 12 hours 10/28/13 2146 10/29/13 0857   10/28/13 0400  vancomycin (VANCOCIN) 1,250 mg in sodium chloride 0.9 % 250 mL IVPB     1,250 mg 166.7 mL/hr over 90 Minutes Intravenous To Surgery 10/27/13 1450 10/28/13 0905   10/28/13 0400  cefUROXime (ZINACEF) 1.5 g in dextrose 5 % 50 mL IVPB     1.5 g 100 mL/hr over 30 Minutes Intravenous To Surgery 10/27/13 1450 10/28/13 2030   10/28/13 0400  cefUROXime (ZINACEF) 750 mg in dextrose 5 % 50 mL IVPB  Status:  Discontinued     750 mg 100 mL/hr over 30 Minutes Intravenous To Surgery 10/27/13 1447 10/28/13 2114   10/27/13 1600  levofloxacin (LEVAQUIN) IVPB 750 mg     750 mg 100 mL/hr over 90 Minutes Intravenous  Once 10/27/13 1456 10/28/13 0231   10/23/13 0800  ceFAZolin (ANCEF) IVPB 1 g/50 mL premix  Status:  Discontinued     1 g 100 mL/hr over 30 Minutes Intravenous  Once 10/22/13 2313 10/22/13 2326   10/22/13 2359  ceFAZolin (ANCEF) IVPB 2 g/50 mL premix  Status:  Discontinued     2 g 100 mL/hr over 30 Minutes Intravenous Every M-W-F (Hemodialysis) 10/22/13 2314 10/28/13 2146   10/22/13 2315  ceFAZolin (ANCEF) IVPB 2 g/50 mL premix  Status:  Discontinued     2 g 100 mL/hr over 30 Minutes Intravenous  Once 10/22/13 2313 10/22/13 2326      Assessment: Patient is a 58 y.o M on nafcillin and rifampin for paravalvular abscess and MSSA bacteremia/endocarditis.  To start gentamicin for  synergy.    Goal of Therapy:  Gentamicin peak= 4  Plan:  1) Will change dose to 120mg  x1 as loading dose after HD today and then 100 mg after each subsequent HD session.  Vanessia Bokhari P 10/30/2013,10:30 AM

## 2013-10-30 NOTE — Progress Notes (Signed)
[facility]  Day # 2 nafcillin Day # 2 rifampin Day #1 gentamicin   Subjective: Intubated but following commands, seems to want his restraints off  Antibiotics:  Anti-infectives   Start     Dose/Rate Route Frequency Ordered Stop   11/01/13 1200  gentamicin (GARAMYCIN) IVPB 100 mg     100 mg 200 mL/hr over 30 Minutes Intravenous Every T-Th-Sa (Hemodialysis) 10/30/13 1037     10/31/13 1200  gentamicin (GARAMYCIN) IVPB 80 mg  Status:  Discontinued     80 mg 100 mL/hr over 30 Minutes Intravenous Every Dialysis 10/30/13 0047 10/30/13 1027   10/30/13 1300  gentamicin (GARAMYCIN) 120 mg in dextrose 5 % 50 mL IVPB     120 mg 106 mL/hr over 30 Minutes Intravenous  Once 10/30/13 1037     10/30/13 0500  gentamicin (GARAMYCIN) IVPB 80 mg     80 mg 100 mL/hr over 30 Minutes Intravenous  Once 10/30/13 0047 10/30/13 0526   10/29/13 1600  levofloxacin (LEVAQUIN) IVPB 500 mg  Status:  Discontinued     500 mg 100 mL/hr over 60 Minutes Intravenous Every 48 hours 10/27/13 1457 10/29/13 0857   10/29/13 1000  rifampin (RIFADIN) 300 mg in sodium chloride 0.9 % 100 mL IVPB     300 mg 200 mL/hr over 30 Minutes Intravenous 3 times per day 10/29/13 0858     10/29/13 1000  nafcillin 2 g in dextrose 5 % 50 mL IVPB     2 g 100 mL/hr over 30 Minutes Intravenous 6 times per day 10/29/13 0901     10/29/13 0900  nafcillin injection 2 g  Status:  Discontinued     2 g Intravenous Every 4 hours 10/29/13 0857 10/29/13 0900   10/29/13 0500  vancomycin (VANCOCIN) IVPB 1000 mg/200 mL premix     1,000 mg 200 mL/hr over 60 Minutes Intravenous  Once 10/28/13 2146 10/29/13 0600   10/29/13 0100  cefUROXime (ZINACEF) 1.5 g in dextrose 5 % 50 mL IVPB  Status:  Discontinued     1.5 g 100 mL/hr over 30 Minutes Intravenous Every 12 hours 10/28/13 2146 10/29/13 0857   10/28/13 0400  vancomycin (VANCOCIN) 1,250 mg in sodium chloride 0.9 % 250 mL IVPB     1,250 mg 166.7 mL/hr over 90 Minutes  Intravenous To Surgery 10/27/13 1450 10/28/13 0905   10/28/13 0400  cefUROXime (ZINACEF) 1.5 g in dextrose 5 % 50 mL IVPB     1.5 g 100 mL/hr over 30 Minutes Intravenous To Surgery 10/27/13 1450 10/28/13 2030   10/28/13 0400  cefUROXime (ZINACEF) 750 mg in dextrose 5 % 50 mL IVPB  Status:  Discontinued     750 mg 100 mL/hr over 30 Minutes Intravenous To Surgery 10/27/13 1447 10/28/13 2114   10/27/13 1600  levofloxacin (LEVAQUIN) IVPB 750 mg     750 mg 100 mL/hr over 90 Minutes Intravenous  Once 10/27/13 1456 10/28/13 0231   10/23/13 0800  ceFAZolin (ANCEF) IVPB 1 g/50 mL premix  Status:  Discontinued     1 g 100 mL/hr over 30 Minutes Intravenous  Once 10/22/13 2313 10/22/13 2326   10/22/13 2359  ceFAZolin (ANCEF) IVPB 2 g/50 mL premix  Status:  Discontinued     2 g 100 mL/hr over 30 Minutes Intravenous Every M-W-F (Hemodialysis) 10/22/13 2314 10/28/13 2146   10/22/13 2315  ceFAZolin (ANCEF) IVPB 2 g/50 mL premix  Status:  Discontinued     2 g 100  mL/hr over 30 Minutes Intravenous  Once 10/22/13 2313 10/22/13 2326      Medications: Scheduled Meds: . acetaminophen  650 mg Rectal 4 times per day  . albumin human      . albumin human      . antiseptic oral rinse  15 mL Mouth Rinse q12n4p  . aspirin EC  325 mg Oral Daily   Or  . aspirin  324 mg Per Tube Daily  . bisacodyl  10 mg Oral Daily   Or  . bisacodyl  10 mg Rectal Daily  . chlorhexidine  15 mL Mouth Rinse BID  . docusate sodium  200 mg Oral Daily  . gentamicin  120 mg Intravenous Once  . [START ON 11/01/2013] gentamicin  100 mg Intravenous Q T,Th,Sa-HD  . insulin aspart  0-24 Units Subcutaneous 6 times per day  . levothyroxine  200 mcg Oral QAC breakfast  . metoprolol tartrate  12.5 mg Oral BID   Or  . metoprolol tartrate  12.5 mg Per Tube BID  . nafcillin IV  2 g Intravenous 6 times per day  . pantoprazole  40 mg Oral Daily  . rifampin (RIFADIN) IVPB  300 mg Intravenous 3 times per day  . sodium chloride  3 mL  Intravenous Q12H   Continuous Infusions: . sodium chloride Stopped (10/30/13 0700)  . sodium chloride 20 mL/hr at 10/30/13 0900  . amiodarone (NEXTERONE PREMIX) 360 mg/200 mL dextrose 30 mg/hr (10/30/13 1300)  . dexmedetomidine 0.7 mcg/kg/hr (10/30/13 1300)  . DOPamine 5 mcg/kg/min (10/30/13 1200)  . lactated ringers Stopped (10/29/13 1900)  . nitroGLYCERIN Stopped (10/28/13 2245)  . norepinephrine (LEVOPHED) Adult infusion 1 mcg/min (10/30/13 1300)  . phenylephrine (NEO-SYNEPHRINE) Adult infusion 65 mcg/min (10/30/13 1300)   PRN Meds:.fentaNYL, metoprolol, midazolam, ondansetron (ZOFRAN) IV, sodium chloride, traMADol   Objective: Weight change: 25 lb 2.1 oz (11.4 kg)  Intake/Output Summary (Last 24 hours) at 10/30/13 1449 Last data filed at 10/30/13 1300  Gross per 24 hour  Intake 3383.28 ml  Output    375 ml  Net 3008.28 ml   Blood pressure 135/71, pulse 89, temperature 98.4 F (36.9 C), temperature source Core (Comment), resp. rate 16, height 6' 1.5" (1.867 m), weight 207 lb 3.7 oz (94 kg), SpO2 100.00%. Temp:  [98.3 F (36.8 C)-100 F (37.8 C)] 98.4 F (36.9 C) (04/02 1345) Pulse Rate:  [67-134] 89 (04/02 1345) Resp:  [14-38] 16 (04/02 1345) BP: (62-135)/(43-84) 135/71 mmHg (04/02 1345) SpO2:  [73 %-100 %] 100 % (04/02 1345) Arterial Line BP: (69-154)/(43-90) 132/79 mmHg (04/02 1345) FiO2 (%):  [30 %-100 %] 30 % (04/02 1200) Weight:  [207 lb 3.7 oz (94 kg)] 207 lb 3.7 oz (94 kg) (04/02 1130)  Physical Exam: General: awake alert, answering ?s with nods on ventilator HEENT: anicteric sclera, , EOMI CVS tachy rate, normal r,   Chest: fairly clear to auscultation bilaterally, Abdomen: soft nontender, nondistended, normal bowel sounds, Extremities: no  clubbing or edema noted bilaterally Skin: sternal wound dressed, CT with serosanguinous material  Neuro: nonfocal  Lab Results:  Recent Labs  10/29/13 1600 10/29/13 1601 10/30/13 0400  WBC 20.1*  --  20.2*  HGB  9.8* 10.2* 8.7*  HCT 30.0* 30.0* 26.3*  PLT 123*  --  117*    BMET  Recent Labs  10/29/13 1508  10/29/13 1601 10/30/13 0400  NA 137  < > 135* 137  K 6.3*  < > 4.4 5.6*  CL 97  < > 102 97  CO2 21  --   --  18*  GLUCOSE 159*  < > 150* 140*  BUN 15  < > 14 20  CREATININE 2.92*  < > 3.40* 3.62*  CALCIUM 9.3  --   --  9.0  < > = values in this interval not displayed.  Micro Results: Recent Results (from the past 240 hour(s))  CLOSTRIDIUM DIFFICILE BY PCR     Status: None   Collection Time    10/22/13 10:04 PM      Result Value Ref Range Status   C difficile by pcr NEGATIVE  NEGATIVE Final  CULTURE, BLOOD (ROUTINE X 2)     Status: None   Collection Time    10/22/13 10:26 PM      Result Value Ref Range Status   Specimen Description BLOOD RIGHT ARM   Final   Special Requests BOTTLES DRAWN AEROBIC ONLY 3CC   Final   Culture  Setup Time     Final   Value: 10/23/2013 04:42     Performed at [facility]   Culture     Final   Value: NO GROWTH 5 DAYS     Performed at [facility]   Report Status 10/29/2013 FINAL   Final  CULTURE, BLOOD (ROUTINE X 2)     Status: None   Collection Time    10/22/13 10:34 PM      Result Value Ref Range Status   Specimen Description BLOOD RIGHT HAND   Final   Special Requests BOTTLES DRAWN AEROBIC ONLY [facility]   Final   Culture  Setup Time     Final   Value: 10/23/2013 04:42     Performed at [facility]   Culture     Final   Value: NO GROWTH 5 DAYS     Performed at [facility]   Report Status 10/29/2013 FINAL   Final  SURGICAL PCR SCREEN     Status: None   Collection Time    10/27/13  1:13 PM      Result Value Ref Range Status   MRSA, PCR NEGATIVE  NEGATIVE Final   Staphylococcus aureus NEGATIVE  NEGATIVE Final   Comment:            The Xpert SA Assay (FDA     approved for NASAL specimens     in patients over 46 years of age),     is one component of     a comprehensive surveillance     program.  Test  performance has     been validated by [employer] for patients greater     than or equal to 45 year old.     It is not intended     to diagnose infection nor to     guide or monitor treatment.  TISSUE CULTURE     Status: None   Collection Time    10/28/13  1:20 PM      Result Value Ref Range Status   Specimen Description TISSUE AORTA   Final   Special Requests PT ON VANCOMYCIN AND ZINACEF   Final   Gram Stain     Final   Value: RARE WBC PRESENT,BOTH PMN AND MONONUCLEAR     NO ORGANISMS SEEN     Performed at [facility]   Culture     Final   Value: NO GROWTH 2 DAYS     Performed at [facility]  Report Status PENDING   Incomplete  GRAM STAIN     Status: None   Collection Time    10/28/13  2:26 PM      Result Value Ref Range Status   Specimen Description ABSCESS AORTA   Final   Special Requests PT ON VANCOMYCIN AND ZINACEF   Final   Gram Stain     Final   Value: RARE WBC PRESENT, PREDOMINANTLY PMN     NO ORGANISMS SEEN   Report Status 10/28/2013 FINAL   Final  ANAEROBIC CULTURE     Status: None   Collection Time    10/28/13  2:26 PM      Result Value Ref Range Status   Specimen Description ABSCESS AORTA   Final   Special Requests PT ON VANCOMYCIN AND ZINACEF   Final   Gram Stain     Final   Value: RARE WBC PRESENT,BOTH PMN AND MONONUCLEAR     NO ORGANISMS SEEN     Performed at [facility]   Culture     Final   Value: NO ANAEROBES ISOLATED; CULTURE IN PROGRESS FOR 5 DAYS     Performed at [facility]   Report Status PENDING   Incomplete  CULTURE, ROUTINE-ABSCESS     Status: None   Collection Time    10/28/13  2:26 PM      Result Value Ref Range Status   Specimen Description ABSCESS AORTA   Final   Special Requests PT ON VANCOMYCIN AND ZINACEF   Final   Gram Stain     Final   Value: RARE WBC PRESENT, PREDOMINANTLY PMN     NO SQUAMOUS EPITHELIAL CELLS SEEN     NO ORGANISMS SEEN     Performed at [facility]     Performed  at [facility]   Culture     Final   Value: NO GROWTH 2 DAYS     Performed at [facility]   Report Status PENDING   Incomplete    Studies/Results: Dg Chest Port 1 View  10/30/2013   CLINICAL DATA:  Endocarditis and periaortic valve abscess. Status post redo Bentall procedure.  EXAM: PORTABLE CHEST - 1 VIEW  COMPARISON:  DG CHEST 1V PORT dated 10/29/2013; CT HEART MORPH/PULM VEIN W/CM&W/O CA SCORE dated 10/26/2013; CT CHEST W/CM dated 10/23/2013; DG CHEST 1 VIEW dated 10/27/2013; DG CHEST 1V PORT dated 10/28/2013; DG CHEST 1V PORT dated 3/31/2  FINDINGS: Endotracheal tube remains with the tip approximately 4 cm above the carina. Right-sided chest tube shows stable positioning. Dialysis catheter positioning on the right is stable. On the left side, a jugular sheath remains as well as an additional central line which coils in the left superior mediastinum. Based on prior CT studies, the tip of this catheter is felt to be likely deflected in a collateral vein that eventually drains posteriorly into a paraspinous hemi azygous vein. On prior CTs, the midline left brachycephalic vein appears to be stenotic.  No pneumothorax is identified. Lungs show no evidence of pulmonary edema. No pleural effusions are seen. The heart size and mediastinal contours are stable.  IMPRESSION: No pneumothorax or pulmonary edema. Stable positioning of support apparatus including a left jugular central line which is likely deflected into a mediastinal collateral vein based on prior CT anatomy.   Electronically Signed   By: Aletta Edouard M.D.   On: 10/30/2013 08:10   Dg Chest Port 1 View  10/29/2013   CLINICAL DATA:  Status post cardiac arrest. Endotracheal tube placement.  EXAM: PORTABLE CHEST - 1 VIEW  COMPARISON:  DG CHEST 1V PORT dated 10/29/2013; DG CHEST 1V PORT dated 10/28/2013; DG CHEST 1 VIEW dated 10/27/2013  FINDINGS: 1512 hr. Endotracheal tube tip is 4.2 cm above the base of the carina. Right IJ dialysis catheter  tip overlies the mid to lower right atrium. There is a right chest tube in place without evidence for right-sided pneumothorax. The left neck sheath remains in place. A left neck vascular catheter remains with its tip coiled over the left aspect of the mediastinum, in the region of the transverse aorta. Defibrillator pads overlie the left chest. Patient is status post median sternotomy. Midline pericardial/ mediastinal drain is again noted.  No overt pulmonary edema or large pleural effusion. Much of the left lung is obscured by the defibrillator pads.  IMPRESSION: No substantial interval change in exam. The position of the left neck vascular catheter was discussed with the clinical team on 10/28/2013.   Electronically Signed   By: [name] Stanley M.D.   On: 10/29/2013 15:31   Dg Chest Portable 1 View In Am  10/29/2013   CLINICAL DATA:  Status post cardiac Sir  EXAM: PORTABLE CHEST - 1 VIEW  COMPARISON:  10/28/2013  FINDINGS: A a right-sided dialysis catheter and right-sided chest tube are again noted and stable. A nasogastric catheter is coiled within the stomach. The endotracheal tube is again seen 6 cm above the carina. A left jugular central line is noted via a sheath and coiled likely within the subclavian vein. This is stable from the prior exam. A mediastinal drain is noted as well.  Cardiac shadow is stable. Persistent left lower lobe atelectasis is noted. No new focal abnormality is seen. No pneumothorax is noted.  IMPRESSION: The overall appearance of the chest is stable from the prior exam.   Electronically Signed   By: Inez Catalina M.D.   On: 10/29/2013 07:53   Dg Chest Portable 1 View  10/28/2013   CLINICAL DATA:  Postop, check support apparatus  EXAM: PORTABLE CHEST - 1 VIEW  COMPARISON:  Prior chest x-ray earlier today at 8:08 a.m.;  FINDINGS: Endotracheal tube terminates 3.6 cm above the carina. Stable position of right IJ approach hemodialysis catheter with tips projecting over the right atrium.  Nasogastric tube is incompletely imaged. The tip lies below the diaphragm presumably within the stomach. Bilateral chest tubes are now present. Unchanged position of what appears to a left IJ vascular sheath and a left IJ central venous catheter. The tip of the catheter is folded upon itself in overlies the left upper lung. The position of this catheter is uncertain, however are remains unchanged. Stable cardiomegaly and mediastinal widening. No pneumothorax. Low inspiratory volumes with bibasilar atelectasis.  IMPRESSION: 1. Unchanged position of the left IJ approach central venous catheters. The catheter tip remains fold upon itself and an on certain location. If intravenous, it is likely within the subclavian vein. 2. Status post redo median sternotomy. New bilateral chest tubes and mediastinal drain. 3. Other support apparatus in stable and satisfactory position. 4. No significant interval change in the appearance of the chest.   Electronically Signed   By: Jacqulynn Cadet M.D.   On: 10/28/2013 22:28      Assessment/Plan: NOLYN GETTMAN is a 58 y.o. 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 WE would use for only 2 weeks and would use as low a dose as possible   With re to Naf vs Ancef we could  repeat MRI of the brain if we wanted to go back to ancef (since CNS penetration is lousy with this) though at present with temp PM in not a good idea,  can wait for later  I spent greater than 25 minutes with the patient including greater than 50% of time in face to face counsel of the patient and family in coordination of their care.       LOS: 8 days   Alcide Evener 10/30/2013, 2:49 PM

## 2013-10-30 NOTE — Progress Notes (Addendum)
[facility] DAILY ICU PROGRESS NOTE                   [place].[address]            ,[place] [address]          [phone]   2 Days Post-Op Procedure(s) (LRB): REDO STERNOTOMY (N/A) REDO BENTALL PROCEDURE, debridment of aoritc root abscess, replacement of aortic root, ascending aorta and aortic valve with homograft. Insertion of left femoral arterial line (N/A) INTRAOPERATIVE TRANSESOPHAGEAL ECHOCARDIOGRAM (N/A)  Total Length of Stay:  LOS: 8 days   Subjective: Alert on vent, following commands, answering yes/no questions  Objective: Vital signs in last 24 hours: Temp:  [98.4 F (36.9 C)-100 F (37.8 C)] 99 F (37.2 C) (04/02 0355) Pulse Rate:  [67-134] 85 (04/02 0700) Cardiac Rhythm:  [-] Sinus tachycardia;Normal sinus rhythm;Heart block (04/01 2000) Resp:  [12-38] 16 (04/02 0700) BP: (62-127)/(43-92) 95/67 mmHg (04/02 0400) SpO2:  [62 %-100 %] 100 % (04/02 0700) Arterial Line BP: (75-154)/(46-90) 102/62 mmHg (04/02 0700) FiO2 (%):  [40 %-100 %] 50 % (04/02 0342) Weight:  [207 lb 3.7 oz (94 kg)] 207 lb 3.7 oz (94 kg) (04/02 0445)  Filed Weights   10/28/13 2230 10/29/13 0500 10/30/13 0445  Weight: 182 lb 1.6 oz (82.6 kg) 203 lb 7.8 oz (92.3 kg) 207 lb 3.7 oz (94 kg)    Weight change: 25 lb 2.1 oz (11.4 kg)   Hemodynamic parameters for last 24 hours: CVP:  [7 mmHg-17 mmHg] 10 mmHg  Intake/Output from previous day: 04/01 0701 - 04/02 0700 In: 3448.9 [I.V.:2178.9; NG/GT:120; IV Piggyback:1150] Out: 490 [Urine:190; Chest Tube:300]  Vent Mode:  [-] SIMV;PRVC;PSV FiO2 (%):  [40 %-100 %] 50 % Set Rate:  [4 bmp-16 bmp] 16 bmp Vt Set:  [700 mL] 700 mL PEEP:  [5 cmH20] 5 cmH20 Pressure Support:  [10 cmH20] 10 cmH20 Plateau Pressure:  [14 cmH20-21 cmH20] 20 cmH20    Intake/Output this shift:    Current Meds: Scheduled Meds: . acetaminophen  650 mg Rectal 4 times per day  . antiseptic oral rinse  15 mL Mouth Rinse q12n4p  . aspirin EC  325 mg Oral Daily   Or  . aspirin  324 mg Per Tube Daily  . bisacodyl  10 mg Oral Daily   Or  . bisacodyl  10 mg Rectal Daily  . chlorhexidine  15 mL Mouth Rinse BID  . docusate sodium  200 mg Oral Daily  . insulin aspart  0-24 Units Subcutaneous 6 times per day  . levothyroxine  200 mcg Oral QAC breakfast  . metoprolol tartrate  12.5 mg Oral BID   Or  . metoprolol tartrate  12.5 mg Per Tube BID  . nafcillin IV  2 g Intravenous 6 times per day  . pantoprazole  40 mg Oral Daily  . rifampin (RIFADIN) IVPB  300 mg Intravenous 3 times per day  . sodium chloride  3 mL Intravenous Q12H   Continuous Infusions: . sodium chloride 20 mL/hr at 10/30/13 0600  . sodium chloride 20 mL/hr at 10/30/13 0600  . amiodarone (NEXTERONE PREMIX) 360 mg/200 mL dextrose 30 mg/hr (10/30/13 0700)  . dexmedetomidine 0.7 mcg/kg/hr (10/30/13 0700)  . DOPamine 5 mcg/kg/min (10/30/13 0700)  . lactated ringers Stopped (10/29/13 1900)  . nitroGLYCERIN Stopped (10/28/13 2245)  . norepinephrine (LEVOPHED) Adult infusion 5 mcg/min (10/30/13 0700)  . phenylephrine (NEO-SYNEPHRINE) Adult infusion 5 mcg/min (10/30/13 0700)   PRN Meds:.fentaNYL, [START ON 10/31/2013]  gentamicin, metoprolol, midazolam, ondansetron (ZOFRAN) IV, sodium chloride, traMADol  General appearance: alert, cooperative and no distress Neurologic: intact Heart: regular rate and rhythm Lungs: somewhat coarse Abdomen: soft, non-tender Extremities: mild edema left leg Wound: dressings intact  Lab Results: CBC: Recent Labs  10/29/13 1600 10/29/13 1601 10/30/13 0400  WBC 20.1*  --  20.2*  HGB 9.8* 10.2* 8.7*  HCT 30.0* 30.0* 26.3*  PLT 123*  --  117*   BMET:  Recent Labs  10/29/13 1508  10/29/13 1601 10/30/13 0400  NA 137  < > 135* 137  K 6.3*  < > 4.4 5.6*  CL 97  < > 102 97  CO2 21  --   --  18*  GLUCOSE 159*  < > 150* 140*  BUN 15  < > 14 20  CREATININE 2.92*  < > 3.40* 3.62*  CALCIUM 9.3  --   --  9.0  < > = values in this interval not  displayed.  PT/INR:  Recent Labs  10/29/13 1508  LABPROT 17.8*  INR 1.51*   ABG    Component Value Date/Time   PHART 7.432 10/29/2013 1536   PCO2ART 33.7* 10/29/2013 1536   PO2ART 200.0* 10/29/2013 1536   HCO3 22.5 10/29/2013 1536   TCO2 22 10/29/2013 1601   ACIDBASEDEF 1.0 10/29/2013 1536   O2SAT 100.0 10/29/2013 1536   Cardiac Panel (last 3 results)  Recent Labs  10/29/13 0920  CKTOTAL 4213*  CKMB >300.0*  TROPONINI >20.00*  RELINDX NOT CALCULATED    Radiology: Dg Chest Port 1 View  10/29/2013   CLINICAL DATA:  Status post cardiac arrest. Endotracheal tube placement.  EXAM: PORTABLE CHEST - 1 VIEW  COMPARISON:  DG CHEST 1V PORT dated 10/29/2013; DG CHEST 1V PORT dated 10/28/2013; DG CHEST 1 VIEW dated 10/27/2013  FINDINGS: 1512 hr. Endotracheal tube tip is 4.2 cm above the base of the carina. Right IJ dialysis catheter tip overlies the mid to lower right atrium. There is a right chest tube in place without evidence for right-sided pneumothorax. The left neck sheath remains in place. A left neck vascular catheter remains with its tip coiled over the left aspect of the mediastinum, in the region of the transverse aorta. Defibrillator pads overlie the left chest. Patient is status post median sternotomy. Midline pericardial/ mediastinal drain is again noted.  No overt pulmonary edema or large pleural effusion. Much of the left lung is obscured by the defibrillator pads.  IMPRESSION: No substantial interval change in exam. The position of the left neck vascular catheter was discussed with the clinical team on 10/28/2013.   Electronically Signed   By: [name] Stanley M.D.   On: 10/29/2013 15:31   Dg Chest Portable 1 View In Am  10/29/2013   CLINICAL DATA:  Status post cardiac Sir  EXAM: PORTABLE CHEST - 1 VIEW  COMPARISON:  10/28/2013  FINDINGS: A a right-sided dialysis catheter and right-sided chest tube are again noted and stable. A nasogastric catheter is coiled within the stomach. The endotracheal tube  is again seen 6 cm above the carina. A left jugular central line is noted via a sheath and coiled likely within the subclavian vein. This is stable from the prior exam. A mediastinal drain is noted as well.  Cardiac shadow is stable. Persistent left lower lobe atelectasis is noted. No new focal abnormality is seen. No pneumothorax is noted.  IMPRESSION: The overall appearance of the chest is stable from the prior exam.   Electronically Signed  By: Inez Catalina M.D.   On: 10/29/2013 07:53   Dg Chest Portable 1 View  10/28/2013   CLINICAL DATA:  Postop, check support apparatus  EXAM: PORTABLE CHEST - 1 VIEW  COMPARISON:  Prior chest x-ray earlier today at 8:08 a.m.;  FINDINGS: Endotracheal tube terminates 3.6 cm above the carina. Stable position of right IJ approach hemodialysis catheter with tips projecting over the right atrium. Nasogastric tube is incompletely imaged. The tip lies below the diaphragm presumably within the stomach. Bilateral chest tubes are now present. Unchanged position of what appears to a left IJ vascular sheath and a left IJ central venous catheter. The tip of the catheter is folded upon itself in overlies the left upper lung. The position of this catheter is uncertain, however are remains unchanged. Stable cardiomegaly and mediastinal widening. No pneumothorax. Low inspiratory volumes with bibasilar atelectasis.  IMPRESSION: 1. Unchanged position of the left IJ approach central venous catheters. The catheter tip remains fold upon itself and an on certain location. If intravenous, it is likely within the subclavian vein. 2. Status post redo median sternotomy. New bilateral chest tubes and mediastinal drain. 3. Other support apparatus in stable and satisfactory position. 4. No significant interval change in the appearance of the chest.   Electronically Signed   By: Jacqulynn Cadet M.D.   On: 10/28/2013 22:28   Dg Chest Portable 1 View  10/28/2013   CLINICAL DATA:  Status post line  placement  EXAM: PORTABLE CHEST - 1 VIEW  COMPARISON:  DG CHEST 1 VIEW dated 10/27/2013  FINDINGS: There is a catheter like structure that projects in the left internal jugular region but the terminus of this catheter is unclear. It appears to coil upon itself and the tip redirected superiorly outside the margin of the image. The loop overlies the anterior aspect of the left second rib. There is no postprocedure pneumothorax. The endotracheal tube tip lies approximately 4.7 cm above the crotch of the carina. The dual-lumen dialysis type catheter placed via the right subclavian approach is unchanged. The esophagogastric tube tip and proximal port project off the inferior margin of the image. The lungs are mildly hypoinflated. There is retrocardiac atelectasis on the left. The cardiac silhouette is enlarged. There is a prosthetic aortic valve.  IMPRESSION: The positioning of the internal jugular catheter on the left is not adequate. The catheter may a folded upon itself and redirected its tip superiorly. Removal and replacement is recommended. These results were called by telephone at the time of interpretation on 10/28/2013 at 8:23 AM to Dineen Kid RN, who verbally acknowledged these results.   Electronically Signed   By: David  Martinique   On: 10/28/2013 08:26   Dg C-arm 1-60 Min-no Report  10/28/2013   CLINICAL DATA: line placement   C-ARM 1-60 MINUTES  Fluoroscopy was utilized by the requesting physician.  No radiographic  interpretation.      Assessment/Plan: S/P Procedure(s) (LRB): REDO STERNOTOMY (N/A) REDO BENTALL PROCEDURE, debridment of aoritc root abscess, replacement of aortic root, ascending aorta and aortic valve with homograft. Insertion of left femoral arterial line (N/A) INTRAOPERATIVE TRANSESOPHAGEAL ECHOCARDIOGRAM (N/A)  1 stable on vent 2 to get one unit PRBC's this am per nephrology recommendation 3 dialysis at 10 am this am 4 hopefully we can wean levophed some 5 leukocytosis  stable- ID is managing abx 6 sugars adeq controlled 7 monitor rhythm closely- cardiac enzymes noted  GOLD,WAYNE E 10/30/2013 7:29 AM  More alert follows commands Wait on trying to  wean and extubated until after dialysis I have seen and examined Bradley [name] and agree with the above assessment  and plan.  [name] MD Beeper [phone] Office [phone] 10/30/2013 7:54 AM

## 2013-10-31 ENCOUNTER — Other Ambulatory Visit: Payer: Self-pay

## 2013-10-31 ENCOUNTER — Inpatient Hospital Stay (HOSPITAL_COMMUNITY): Payer: Medicaid Other

## 2013-10-31 LAB — TYPE AND SCREEN
ABO/RH(D): A NEG
Antibody Screen: NEGATIVE
Unit division: 0
Unit division: 0
Unit division: 0
Unit division: 0
Unit division: 0
Unit division: 0
Unit division: 0
Unit division: 0
Unit division: 0
Unit division: 0
Unit division: 0
Unit division: 0
Unit division: 0
Unit division: 0
Unit division: 0
Unit division: 0
Unit division: 0

## 2013-10-31 LAB — POCT I-STAT 3, ART BLOOD GAS (G3+)
Acid-Base Excess: 4 mmol/L — ABNORMAL HIGH (ref 0.0–2.0)
Acid-Base Excess: 3 mmol/L — ABNORMAL HIGH (ref 0.0–2.0)
Bicarbonate: 26.8 mEq/L — ABNORMAL HIGH (ref 20.0–24.0)
Bicarbonate: 25.6 mEq/L — ABNORMAL HIGH (ref 20.0–24.0)
O2 Saturation: 98 %
O2 Saturation: 99 %
Patient temperature: 99
Patient temperature: 99
TCO2: 28 mmol/L (ref 0–100)
TCO2: 27 mmol/L (ref 0–100)
pCO2 arterial: 34.3 mmHg — ABNORMAL LOW (ref 35.0–45.0)
pCO2 arterial: 31.8 mmHg — ABNORMAL LOW (ref 35.0–45.0)
pH, Arterial: 7.502 — ABNORMAL HIGH (ref 7.350–7.450)
pH, Arterial: 7.515 — ABNORMAL HIGH (ref 7.350–7.450)
pO2, Arterial: 94 mmHg (ref 80.0–100.0)
pO2, Arterial: 107 mmHg — ABNORMAL HIGH (ref 80.0–100.0)

## 2013-10-31 LAB — TISSUE CULTURE: Culture: NO GROWTH

## 2013-10-31 LAB — COMPREHENSIVE METABOLIC PANEL
ALT: 84 U/L — ABNORMAL HIGH (ref 0–53)
AST: 212 U/L — ABNORMAL HIGH (ref 0–37)
Albumin: 2.2 g/dL — ABNORMAL LOW (ref 3.5–5.2)
Alkaline Phosphatase: 66 U/L (ref 39–117)
BUN: 16 mg/dL (ref 6–23)
CO2: 24 mEq/L (ref 19–32)
Calcium: 8.1 mg/dL — ABNORMAL LOW (ref 8.4–10.5)
Chloride: 96 mEq/L (ref 96–112)
Creatinine, Ser: 2.92 mg/dL — ABNORMAL HIGH (ref 0.50–1.35)
GFR calc Af Amer: 26 mL/min — ABNORMAL LOW (ref >90)
GFR calc non Af Amer: 23 mL/min — ABNORMAL LOW (ref >90)
Glucose, Bld: 134 mg/dL — ABNORMAL HIGH (ref 70–99)
Potassium: 4 mEq/L (ref 3.7–5.3)
Sodium: 138 mEq/L (ref 137–147)
Total Bilirubin: 1.1 mg/dL (ref 0.3–1.2)
Total Protein: 5.1 g/dL — ABNORMAL LOW (ref 6.0–8.3)

## 2013-10-31 LAB — CBC
HCT: 27.1 % — ABNORMAL LOW (ref 39.0–52.0)
Hemoglobin: 9.1 g/dL — ABNORMAL LOW (ref 13.0–17.0)
MCH: 28.4 pg (ref 26.0–34.0)
MCHC: 33.6 g/dL (ref 30.0–36.0)
MCV: 84.7 fL (ref 78.0–100.0)
Platelets: 89 10*3/uL — ABNORMAL LOW (ref 150–400)
RBC: 3.2 MIL/uL — ABNORMAL LOW (ref 4.22–5.81)
RDW: 21 % — ABNORMAL HIGH (ref 11.5–15.5)
WBC: 15.1 10*3/uL — ABNORMAL HIGH (ref 4.0–10.5)

## 2013-10-31 LAB — GLUCOSE, CAPILLARY
Glucose-Capillary: 105 mg/dL — ABNORMAL HIGH (ref 70–99)
Glucose-Capillary: 94 mg/dL (ref 70–99)
Glucose-Capillary: 106 mg/dL — ABNORMAL HIGH (ref 70–99)
Glucose-Capillary: 127 mg/dL — ABNORMAL HIGH (ref 70–99)
Glucose-Capillary: 90 mg/dL (ref 70–99)

## 2013-10-31 LAB — CULTURE, ROUTINE-ABSCESS: Culture: NO GROWTH

## 2013-10-31 LAB — CK TOTAL AND CKMB (NOT AT ARMC)
CK, MB: 9.8 ng/mL (ref 0.3–4.0)
Relative Index: 2.5 (ref 0.0–2.5)
Total CK: 400 U/L — ABNORMAL HIGH (ref 7–232)

## 2013-10-31 LAB — TROPONIN I
Troponin I: >20 ng/mL (ref <0.30)
Troponin I: >20 ng/mL (ref <0.30)

## 2013-10-31 MED ORDER — AMIODARONE HCL IN DEXTROSE 360-4.14 MG/200ML-% IV SOLN
INTRAVENOUS | Status: AC
  Administered 2013-10-31: 150 mg/h
  Filled 2013-10-31: qty 200

## 2013-10-31 MED ORDER — AMIODARONE HCL 200 MG PO TABS
200.0000 mg | ORAL_TABLET | Freq: Every day | ORAL | Status: DC
  Administered 2013-10-31 – 2013-11-13 (×14): 200 mg via ORAL
  Filled 2013-10-31 (×14): qty 1

## 2013-10-31 MED ORDER — HYDROCODONE-ACETAMINOPHEN 5-325 MG PO TABS
1.0000 | ORAL_TABLET | ORAL | Status: DC | PRN
  Administered 2013-11-01: 2 via ORAL
  Filled 2013-10-31: qty 2

## 2013-10-31 MED ORDER — FENTANYL CITRATE 0.05 MG/ML IJ SOLN
12.5000 ug | INTRAMUSCULAR | Status: DC | PRN
  Administered 2013-10-31 – 2013-11-01 (×2): 12.5 ug via INTRAVENOUS
  Filled 2013-10-31 (×2): qty 2

## 2013-10-31 MED ORDER — ACETAMINOPHEN 500 MG PO TABS
1000.0000 mg | ORAL_TABLET | Freq: Four times a day (QID) | ORAL | Status: DC
  Administered 2013-10-31 – 2013-11-05 (×17): 1000 mg via ORAL
  Filled 2013-10-31 (×22): qty 2

## 2013-10-31 MED ORDER — LEVOTHYROXINE SODIUM 200 MCG PO TABS
200.0000 ug | ORAL_TABLET | Freq: Every day | ORAL | Status: DC
  Administered 2013-11-01 – 2013-11-13 (×13): 200 ug via ORAL
  Filled 2013-10-31 (×16): qty 1

## 2013-10-31 MED ORDER — LEVOTHYROXINE SODIUM 100 MCG IV SOLR
100.0000 ug | Freq: Once | INTRAVENOUS | Status: AC
  Administered 2013-10-31: 100 ug via INTRAVENOUS
  Filled 2013-10-31: qty 5

## 2013-10-31 MED ORDER — AMIODARONE IV BOLUS ONLY 150 MG/100ML: 150.0000 mg | Freq: Once | INTRAVENOUS | Status: AC

## 2013-10-31 MED ORDER — MIDAZOLAM HCL 2 MG/2ML IJ SOLN: 1.0000 mg | Freq: Four times a day (QID) | INTRAMUSCULAR | Status: DC | PRN

## 2013-10-31 NOTE — Op Note (Signed)
NAMEEVARISTO, SPENNER               ACCOUNT NO.:  [id number]  MEDICAL RECORD NO.:  [id number]  LOCATION:  O                            FACILITY:  [facility]  PHYSICIAN:  Lanelle Bal, MD    DATE OF BIRTH:  27-Apr-1956  DATE OF PROCEDURE:  10/28/2013 DATE OF DISCHARGE:                              OPERATIVE REPORT   PREOPERATIVE DIAGNOSIS:  Aortic root abscess.  POSTOPERATIVE DIAGNOSIS:  Aortic root abscess.  SURGICAL PROCEDURE:  Redo sternotomy, redo Bentall replacement of aortic valve, aortic root, and ascending aorta with reimplantation of coronary arteries using a homograft with debridement of aortic valve abscess, placement of left femoral arterial line.  SURGEON:  Lanelle Bal, MD  FIRST ASSISTANT:  [name], PA-C.  BRIEF HISTORY:  The patient is a 58 year old male, who 5 years previously had undergone replacement of his aortic root and ascending aorta and aortic valve with a Valsalva graft with a 23-mm pericardial tissue valve.  At age 36, the patient had, had an aortic valve commissurotomy.  Over the years, he developed severe aortic stenosis and aortic insufficiency and dilated aortic root.  Following his initial surgery, he did well returning to shoeing horses.  In February of this year, he developed endocarditis.  During that period of treatment, he had a transthoracic echo, and CT scan of the head and abdomen, but not of the chest.  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  He was being treated as an outpatient because of left clavicular pain and almost at the completion of his 6 weeks of antibiotics, he was seen by Dr. Tommy Medal and admitted.  He had no fever or chills.  Cardiac Surgery was then consulted.  Arrangements were made for a TEE and CT scan of the chest and cardiac CT.  This imaging confirmed an aortic root abscess involving two-thirds of the aortic root with a component partially obstructing the right  coronary artery during systole.  With these findings, it was recommended to the patient that redo Bentall be performed.  Risks and options were discussed with him and his family in detail.  They were aware of the critical nature of the findings and the aortic root abscess.  The patient signed informed consent.  DESCRIPTION OF PROCEDURE:  The patient was brought to the operating room.  Anesthesia attempted to place a Swan-Ganz catheter, but were unable to do so satisfactorily.  He did have a left internal jugular sheath available.  In the operating room, he underwent general endotracheal anesthesia, TEE probe was placed by Dr. Linna Caprice. Fluoroscopy was used to attempt to float the [place], but again was unsuccessful.  The skin of the chest and legs were prepped with Betadine and draped in sterile manner.  Appropriate time-out was performed.  We then proceeded first with exposure of the femoral artery and vein.  An incision was made over the left groin and dissection was carried down. The femoral artery had previously been cannulated at age of 65 with his first operation.  The femoral vein was of good size and was also dissected out to allow for cannulation.  We then proceeded with sternotomy.  At the  time of the patient's second operation, it was noted some difficulty getting the right ventricle off the back of the sternum. We carefully with a sagittal saw re-opened the sternum and then dissected the heart off the back of the sternum.  We then proceeded dissecting out primarily the right atrium and ascending aorta taking this in a stepwise manner, starting superiorly and inferiorly.  The area of impending rupture was avoided at the aortic root.  The patient was then systemically heparinized.  The purse-string suture of Gore-Tex was then placed in the femoral artery, and with the Seldinger technique, a guidewire was placed into the femoral artery and over this, dilators were passed and then an  18-French straight femoral arterial catheter was introduced.  We then used a dual-stage venous catheter first placing a guidewire through the femoral vein up into the atrium.  This position was confirmed with TEE.  The catheter was then passed up to the right atrium.  With as much dissection as possible done initially off pump, we then placed the patient on cardiopulmonary bypass and continued the dissection around the aorta to be able to place a cross-clamp.  The distal portion of the graft was well incorporated at the ridge at the top of the Valsalva portion of the graft.  Up distally the graft was well incorporated, below this the graft was not as well incorporated.  A retrograde cardioplegic catheter was placed.  An aortic root vent cardioplegia needle was introduced into the ascending aorta.  Cross- clamp was applied and cold antegrade cardioplegia was administered and an additional retrograde cardioplegia was administered.  With the crossclamp on, we then proceeded with dissection into the aortic root, and it became apparent as indicated on the imaging that the anterior two- thirds of the Valsalva graft and tissue valve had dehisced from the annulus.  The graft was dissected out and removed with the associated pledgets.  As we proceeded with this dissection, coronary buttons were excised from the graft, and with the graft removed, we then continued to debride an abscess pocket that extended both towards the septum and anteriorly.  Cultures of this area were obtained.  A 23-mm homograft was selected per protocol.  While this was proceeding, we placed pledgeted sutures #2 Tycron circumferentially with the pledgets on the outside surface into the outflow tract to reapproximate as much of the outflow tract as possible.  We then selected with these in place a homograft and tacked.  We repositioned the homograft in such a way as to use the bulk of the remnant muscle to fill the area of the  large portion of the abscess cavity.  Sutures were then placed through the homograft and external pledgets were also used.  Homograft was then secured in place. We then proceeded with left coronary button.  A punch aortotomy was created on the posterior medial portion of the aorta to align with the left coronary button.  The button was then sewed on with a running 5-0 Prolene.  Coseal was placed along the suture lines.  We then trimmed the distal end of the homograft to approximate the size of an ascending aorta and the homograft was then sewed distally with a running 4-0 Prolene with felt strips.  The right coronary button was then sewed onto the anterior surface of the homograft with a running 5-0 Prolene and aortic root vent cardioplegia needle was placed back into the homograft distally to allow de-airing.  The heart was allowed to passively fill and  de-air and the aortic crossclamp was removed.  Initially, the patient required electric defibrillation and returned to a sinus rhythm initially.  After a few minutes of inspection, increasing amount of bleeding was coming from down in the aortic root area, it was unclear if this was from the proximal suture line or the right coronary button. The crossclamp was placed back on, additional cold blood cardioplegia was administered, and several repair stitches were placed in the area of bleeding, which appeared to be along the right coronary button. Crossclamp was then again removed.  The patient was rewarmed to 37 degrees.  He did require electrical defibrillation again and had several episodes of V-tach, was loaded with amiodarone.  With the patient's body temperature rewarmed to 37 degrees and the echo looking satisfactorily as far as aortic and mitral insufficiency, we then began to fill the heart.  The right superior pulmonary vein vent was removed and the patient was weaned from cardiopulmonary bypass and remained hemodynamically stable  on low-dose dopamine and Levophed.  The femoral and the venous femoral cannulas were removed.  Protamine sulfate was administered.  Because of the patient's severe coagulopathy and thrombocytopenia and low fibrinogen, fresh-frozen platelets were all administered to control bleeding.  With the operative field reasonably hemostatic and blood pressure stable, a right chest tube and 2 Blake drains were left in the chest.  There was no pericardium to close.  The sternum was closed with #6 stainless steel wire.  Fascia closed with interrupted 0-Vicryl, running 3-0 Vicryl in the subcutaneous tissue, 4-0 subcuticular stitch in skin edges.  Dry dressings were applied.  During the case, because of malfunctioning radial arterial line, a left femoral arterial line was placed, and it was functioning well.  The patient did require packed red blood cells 2 units in the pump because of low starting hematocrit and additional 2 units were given later.  Total crossclamp time 300 minutes.  Total pump time was 420 minutes.  The patient was then transported to the surgical intensive care unit in critical, but stable condition for postoperative care.     Lanelle Bal, MD     EG/MEDQ  D:  10/30/2013  T:  10/30/2013  Job:  [id number]

## 2013-10-31 NOTE — Progress Notes (Signed)
Attempted to get pt oob to chair per MD order. Upon standing pt got tachycardic, started having a BM and drooling. Pt placed back in bed, 12 lead EKG was obtained and MD called and notified of pt status. MD ordered 150cc amiodarone bolus and cardiac enzymes. Bolus was administered per protocol and enzymes were drawn. Will continue to monitor and update MD with results as needed.

## 2013-10-31 NOTE — Progress Notes (Signed)
PT Cancellation Note  Patient Details Name: Bradley [name] MRN: [id number] DOB: 1956-06-11   Cancelled Treatment:    Reason Eval Not Completed: Patient not medically ready. Pt extubated this morning at 8:24. On 94 cg of neosynephrine.  Noted OOB to chair orders not to begin until today at 1800. Will attempt evaluation on 4/4.   [name] 10/31/2013, 10:02 AM Pager [phone]

## 2013-10-31 NOTE — Procedures (Signed)
Extubation Procedure Note  Patient Details:   Name: Bradley [name] DOB: 02-16-1956 MRN: [id number]   Airway Documentation:     Evaluation  O2 sats: stable throughout Complications: No apparent complications Patient did tolerate procedure well. Bilateral Breath Sounds: Coarse crackles;Rhonchi Suctioning: Airway Yes  NIF -50, VC [place], [place] 10/31/2013, 8:24 AM

## 2013-10-31 NOTE — Progress Notes (Signed)
Patient ID: Bradley [name], male   DOB: 1955-09-16, 58 y.o.   MRN: [id number]  [facility] Progress Note   Assessment/ Plan:   1 Paravalvular abscess/Prosthetic valve endocarditis: on nafcillin/RFM/Gent, hemodynamically stable on 3nd day post debridement of aortic root abscess, replacement of aortic root, ascending aorta and aortic valve with homograft. Extubated earlier this AM.  2 ESRD on hemodialysis- HD done yesterday with some initial intra-HD hypotension but did better after albumin bolus and up titration of pressors. Plan for next HD tomorrow and then again Monday to get to MWF schedule. He has volume excess but remains on pressors that limit UF. 3 Recent MSSA bacteremia w septic cerebral emboli / endocarditis: now s/p debridement of aortic root abscess and root replacement with homograft. On ABx.  4 Anemia Hb 10 on darbe 100/wk - re-dose iron as he continues to improve  5 HTN/volume- on pressors, hypervolemic (interstitial) on exam  Subjective:   Denies any complaint- extubated earlier this morning   Objective:   BP 133/56  Pulse 77  Temp(Src) 99.2 F (37.3 C) (Core (Comment))  Resp 16  Ht 6' 1.5" (1.867 m)  Wt 97.9 kg (215 lb 13.3 oz)  BMI 28.09 kg/m2  SpO2 100%  Physical Exam: Gen: Comfortably resting in bed, able to respond to questions CVS: Pulse RRR, normal S1 and muffled S2 Resp:Coarse bilaterally, no rales/rhonchi [id number], obese, NT Ext:2+ edema over UEs, 1+ edema over LEs. Good thrill over Left RCF  Labs: BMET  Recent Labs Lab 10/26/13 0626 10/27/13 1833  10/28/13 2223 10/29/13 0400 10/29/13 1508 10/29/13 1514 10/29/13 1600 10/29/13 1601 10/30/13 0400 10/31/13 0410  NA 136* 136*  < > 136* 135* 137 134*  --  135* 137 138  K 4.5 4.4  < > 4.3 5.6* 6.3* 5.7*  --  4.4 5.6* 4.0  CL 97 96  --   --  101 97 102  --  102 97 96  CO2 27 27  --   --  20 21  --   --   --  18* 24  GLUCOSE 96 100*  < > 125* 114* 159* 156*  --  150* 140* 134*  BUN 5*  12  --   --  12 15 17   --  14 20 16   CREATININE 2.42* 3.16*  --   --  2.37* 2.92* 3.20* 2.97* 3.40* 3.62* 2.92*  CALCIUM 8.4 8.5  --   --  9.3 9.3  --   --   --  9.0 8.1*  PHOS 2.6  --   --   --   --  5.5*  --   --   --   --   --   < > = values in this interval not displayed. CBC  Recent Labs Lab 10/29/13 1508  10/29/13 1600 10/29/13 1601 10/30/13 0400 10/31/13 0410  WBC 26.7*  --  20.1*  --  20.2* 15.1*  HGB 10.7*  < > 9.8* 10.2* 8.7* 9.1*  HCT 33.0*  < > 30.0* 30.0* 26.3* 27.1*  MCV 88.7  --  86.7  --  85.7 84.7  PLT 130*  --  123*  --  117* 89*  < > = values in this interval not displayed.  Medications:    . acetaminophen  650 mg Rectal 4 times per day  . antiseptic oral rinse  15 mL Mouth Rinse q12n4p  . aspirin EC  325 mg Oral Daily   Or  . aspirin  324  mg Per Tube Daily  . bisacodyl  10 mg Oral Daily   Or  . bisacodyl  10 mg Rectal Daily  . chlorhexidine  15 mL Mouth Rinse BID  . docusate sodium  200 mg Oral Daily  . [START ON 11/01/2013] gentamicin  100 mg Intravenous Q T,Th,Sa-HD  . insulin aspart  0-24 Units Subcutaneous 6 times per day  . [START ON 11/01/2013] levothyroxine  200 mcg Oral QAC breakfast  . metoprolol tartrate  12.5 mg Oral BID   Or  . metoprolol tartrate  12.5 mg Per Tube BID  . nafcillin IV  2 g Intravenous 6 times per day  . pantoprazole  40 mg Oral Daily  . rifampin (RIFADIN) IVPB  300 mg Intravenous 3 times per day  . sodium chloride  3 mL Intravenous Q12H   Elmarie Shiley, MD 10/31/2013, 8:36 AM

## 2013-10-31 NOTE — Progress Notes (Signed)
CT Surgery pm rounds  Extubated with neuro intact Did not tolerate OOB to chair ECG with some ST depression, CPK 9 and troponin still positive from surgery with renal failure Hemodynamics stable after load of IV amio Checking serial enzymes

## 2013-10-31 NOTE — Progress Notes (Signed)
3 Days Post-Op Procedure(s) (LRB): REDO STERNOTOMY (N/A) REDO BENTALL PROCEDURE, debridment of aoritc root abscess, replacement of aortic root, ascending aorta and aortic valve with homograft. Insertion of left femoral arterial line (N/A) INTRAOPERATIVE TRANSESOPHAGEAL ECHOCARDIOGRAM (N/A) Subjective: Redo Bentall for endocarditis HD for CRF Extubated this am NSR on amio  Objective: Vital signs in last 24 hours: Temp:  [97.1 F (36.2 C)-99.8 F (37.7 C)] 99.2 F (37.3 C) (04/03 0824) Pulse Rate:  [76-99] 77 (04/03 0824) Cardiac Rhythm:  [-] Normal sinus rhythm (04/03 0800) Resp:  [0-34] 16 (04/03 0824) BP: (67-145)/(43-90) 133/56 mmHg (04/03 0800) SpO2:  [98 %-100 %] 100 % (04/03 0824) Arterial Line BP: (69-140)/(43-82) 105/60 mmHg (04/03 0824) FiO2 (%):  [30 %-40 %] 30 % (04/03 0800) Weight:  [207 lb 3.7 oz (94 kg)-215 lb 13.3 oz (97.9 kg)] 215 lb 13.3 oz (97.9 kg) (04/03 0400)  Hemodynamic parameters for last 24 hours: CVP:  [3 mmHg-27 mmHg] 4 mmHg  Intake/Output from previous day: 04/02 0701 - 04/03 0700 In: 3001.1 [I.V.:2123.1; Blood:275; IV [id number] Out: 490 [Urine:330; Chest Tube:160] Intake/Output this shift: Total I/O In: 133.2 [I.V.:83.2; IV Piggyback:50] Out: 20 [Urine:20]  neuro intact extrem edematous  Recent Labs  10/30/13 0400 10/31/13 0410  WBC 20.2* 15.1*  HGB 8.7* 9.1*  HCT 26.3* 27.1*  PLT 117* 89*   BMET:  Recent Labs  10/30/13 0400 10/31/13 0410  NA 137 138  K 5.6* 4.0  CL 97 96  CO2 18* 24  GLUCOSE 140* 134*  BUN 20 16  CREATININE 3.62* 2.92*  CALCIUM 9.0 8.1*    PT/INR:  Recent Labs  10/29/13 1508  LABPROT 17.8*  INR 1.51*   ABG    Component Value Date/Time   PHART 7.502* 10/31/2013 0630   HCO3 26.8* 10/31/2013 0630   TCO2 28 10/31/2013 0630   ACIDBASEDEF 1.0 10/29/2013 1536   O2SAT 98.0 10/31/2013 0630   CBG (last 3)   Recent Labs  10/30/13 2328 10/31/13 0346 10/31/13 0741  GLUCAP 89 105* 94     Assessment/Plan: S/P Procedure(s) (LRB): REDO STERNOTOMY (N/A) REDO BENTALL PROCEDURE, debridment of aoritc root abscess, replacement of aortic root, ascending aorta and aortic valve with homograft. Insertion of left femoral arterial line (N/A) INTRAOPERATIVE TRANSESOPHAGEAL ECHOCARDIOGRAM (N/A) Mobilize OOB- PT cx Cont po amio  LOS: 9 days    VAN TRIGT III,Asaiah Scarber 10/31/2013

## 2013-10-31 NOTE — Progress Notes (Signed)
Pt placed on wean settings per MD request for early AM wean

## 2013-10-31 NOTE — Progress Notes (Signed)
CRITICAL VALUE ALERT  Critical value received:  CKMB 9.8 Troponin > 20  Date of notification:  10/31/13  Time of notification:  1618  Critical value read back:yes  Nurse who received alert:  Charmayne Sheer, RN  MD notified (1st page):  Lucianne Lei Tright  Time of first page:  1619  MD notified (2nd page):  Time of second page:  Responding MD:  Nils Pyle  Time MD responded:  [phone]

## 2013-11-01 ENCOUNTER — Inpatient Hospital Stay (HOSPITAL_COMMUNITY): Payer: Medicaid Other

## 2013-11-01 DIAGNOSIS — IMO0002 Reserved for concepts with insufficient information to code with codable children: Secondary | ICD-10-CM

## 2013-11-01 DIAGNOSIS — F4310 Post-traumatic stress disorder, unspecified: Secondary | ICD-10-CM

## 2013-11-01 LAB — COMPREHENSIVE METABOLIC PANEL
ALT: 70 U/L — ABNORMAL HIGH (ref 0–53)
AST: 137 U/L — ABNORMAL HIGH (ref 0–37)
Albumin: 1.9 g/dL — ABNORMAL LOW (ref 3.5–5.2)
Alkaline Phosphatase: 72 U/L (ref 39–117)
BUN: 24 mg/dL — ABNORMAL HIGH (ref 6–23)
CO2: 22 mEq/L (ref 19–32)
Calcium: 7.9 mg/dL — ABNORMAL LOW (ref 8.4–10.5)
Chloride: 94 mEq/L — ABNORMAL LOW (ref 96–112)
Creatinine, Ser: 3.93 mg/dL — ABNORMAL HIGH (ref 0.50–1.35)
GFR calc Af Amer: 18 mL/min — ABNORMAL LOW (ref >90)
GFR calc non Af Amer: 16 mL/min — ABNORMAL LOW (ref >90)
Glucose, Bld: 107 mg/dL — ABNORMAL HIGH (ref 70–99)
Potassium: 3.6 mEq/L — ABNORMAL LOW (ref 3.7–5.3)
Sodium: 137 mEq/L (ref 137–147)
Total Bilirubin: 1 mg/dL (ref 0.3–1.2)
Total Protein: 5.2 g/dL — ABNORMAL LOW (ref 6.0–8.3)

## 2013-11-01 LAB — GLUCOSE, CAPILLARY
Glucose-Capillary: 102 mg/dL — ABNORMAL HIGH (ref 70–99)
Glucose-Capillary: 104 mg/dL — ABNORMAL HIGH (ref 70–99)
Glucose-Capillary: 110 mg/dL — ABNORMAL HIGH (ref 70–99)
Glucose-Capillary: 126 mg/dL — ABNORMAL HIGH (ref 70–99)
Glucose-Capillary: 91 mg/dL (ref 70–99)
Glucose-Capillary: 94 mg/dL (ref 70–99)

## 2013-11-01 LAB — CBC
HCT: 28 % — ABNORMAL LOW (ref 39.0–52.0)
Hemoglobin: 9.4 g/dL — ABNORMAL LOW (ref 13.0–17.0)
MCH: 28.2 pg (ref 26.0–34.0)
MCHC: 33.6 g/dL (ref 30.0–36.0)
MCV: 84.1 fL (ref 78.0–100.0)
Platelets: 120 10*3/uL — ABNORMAL LOW (ref 150–400)
RBC: 3.33 MIL/uL — ABNORMAL LOW (ref 4.22–5.81)
RDW: 20.6 % — ABNORMAL HIGH (ref 11.5–15.5)
WBC: 14 10*3/uL — ABNORMAL HIGH (ref 4.0–10.5)

## 2013-11-01 LAB — TROPONIN I: Troponin I: >20 ng/mL (ref <0.30)

## 2013-11-01 MED ORDER — DOPAMINE-DEXTROSE 3.2-5 MG/ML-% IV SOLN: 2.0000 ug/kg/min | INTRAVENOUS | Status: DC

## 2013-11-01 MED ORDER — SERTRALINE HCL 50 MG PO TABS
50.0000 mg | ORAL_TABLET | Freq: Every day | ORAL | Status: DC
  Administered 2013-11-02 – 2013-11-13 (×12): 50 mg via ORAL
  Filled 2013-11-01 (×12): qty 1

## 2013-11-01 MED ORDER — ENOXAPARIN SODIUM 30 MG/0.3ML ~~LOC~~ SOLN
30.0000 mg | SUBCUTANEOUS | Status: DC
  Administered 2013-11-01: 30 mg via SUBCUTANEOUS
  Filled 2013-11-01 (×2): qty 0.3

## 2013-11-01 MED ORDER — POLYETHYLENE GLYCOL 3350 17 G PO PACK
17.0000 g | PACK | Freq: Every day | ORAL | Status: DC
  Filled 2013-11-01: qty 1

## 2013-11-01 MED ORDER — GENTAMICIN IN SALINE 1-0.9 MG/ML-% IV SOLN
100.0000 mg | INTRAVENOUS | Status: DC
  Administered 2013-11-03: 100 mg via INTRAVENOUS
  Filled 2013-11-01 (×2): qty 100

## 2013-11-01 MED ORDER — MIRTAZAPINE 15 MG PO TBDP
15.0000 mg | ORAL_TABLET | Freq: Every day | ORAL | Status: DC
  Administered 2013-11-01 – 2013-11-12 (×12): 15 mg via ORAL
  Filled 2013-11-01 (×14): qty 1

## 2013-11-01 MED ORDER — CLONAZEPAM 1 MG PO TABS: 1.0000 mg | ORAL_TABLET | Freq: Every day | ORAL | Status: DC

## 2013-11-01 MED ORDER — CLONAZEPAM 1 MG PO TABS
1.0000 mg | ORAL_TABLET | Freq: Two times a day (BID) | ORAL | Status: DC
  Administered 2013-11-01: 1 mg via ORAL
  Filled 2013-11-01: qty 1

## 2013-11-01 MED ORDER — FENTANYL CITRATE 0.05 MG/ML IJ SOLN: 25.0000 ug | INTRAMUSCULAR | Status: DC | PRN

## 2013-11-01 NOTE — Consult Note (Signed)
Reason for Consult: Depression Referring Physician: Jonetta Osgood, MD   IRIE FIORELLO is an 58 y.o. male.  HPI: Patient was seen and chart reviewed. Patient has been suffering with increased symptoms of depression, disturbance of sleep, nightmares and extreme anxiety about major cardiac procedure scheduled for tomorrow. Patient stated he cannot relax during nighttime and daytime he has been disturbed by several people/staff members throughout the day. Patient feels he has been physically and mentally exhausted and has a negative thinking about the procedure. Patient stated he has been feeling he has been a burden to his family and has a passive thoughts about not waking up next day but denies active suicidal ideation, intentions or plans. Patient is supported by his family and he reported he has a trust with the doctor who is performing the procedure. Patient also reported he had 2 previous cardiac procedures which went well and recovered without difficulties. Patient has a 4 children ages from 15-31. Patient has been compliant with his medications and has no reported adverse effects.  Interval History: Patient has been recovering from his major cardiac surgery and also intubation which was extubated yesterday. Patient has been feeling tired, weak, says struggling with nightmares. He has been emotional and tearful during this visit. Patient and his wife are wiling to restart his medications of depression.   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 He was initally broadly treated then appropriately narrowed to nafcillin, gentamicin and rifampin. His blood cultures cleared on 2/11/5. He unfortunately developed renal failure and gentamicin was held. He was treated with IV nafcillin and IV rifampin 300  mg IV q 8 hours thru end of February when rifampin changed to 622m daily.He was ultimately discharged on 10/10/13 with vancomycin and oral rifampin because he lost IV access besided HD site. HE has AV fistula created in left forearm for HD. His last dose of vanocomcyin on 3/24. He was seen at the infectious disease clinic on 3/25 weight complained of pain in his chest with deep breathing, and on exam he was found to have tenderness and erythema on the left sternoclavicular joint area. He was then referred to the hospitalist service for further evaluation and treatment. 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  Mental Status Examination: Patient appeared as per his stated age,  decrease in psychomotor activity, with depression mood and anxious affect. He has normal rate, rhythm, and low volume of speech. His thought process is linear and goal directed. Patient has denied suicidal, homicidal ideations, intentions or plans. Patient has no evidence of auditory or visual hallucinations, delusions, and paranoia. Patient has fair insight judgment and impulse control.  Past Medical History  Diagnosis Date  . Anxiety   . Depression   . Hypertension   . Hypothyroidism, postsurgical   . Aortic stenosis     s/p Bentall with bioprosthetic AVR 02/2010; Last echo (9/11): Moderate LVH, EF 45-50%, AVR functioning appropriately, aortic valve mean gradient 21, diastolic dysfunction. Chest MRA (2/13): Mild to moderate dilatation at the sinus of Valsalva at 4.1 cm, mild dilatation ascending aorta distal to the tube graft at 3.9  cm, moderate dilatation of the innominate artery a 2.1 cm;    . Hx of cardiac cath     a. LHC in 02/2010: normal cors  . Hx of echocardiogram 2014    Echo (10/14): Severe LVH, EF 60-65%, normal wall motion, grade 1  diastolic dysfunction, AVR functioning normally, mild aortic stenosis (mean 19), moderately dilated aorta, mild LAE, mild RVE  . Staphylococcus aureus bacteremia   . Prosthetic valve endocarditis   . Thyroid cancer   . Heart murmur   . ESRD on dialysis     "[facility]; TTS" (10/22/2013)    Past Surgical History  Procedure Laterality Date  . Cardiac catheterization  03/02/2010    NORMAL CORONARY ARTERY  . Sternotomy      REDO  . Transthoracic echocardiogram  03/2010    SHOWED MILD REDUCTION OF LV FUNCTION  . Thyroidectomy  ~ 2005  . Shoulder arthroscopy w/ rotator cuff repair Right 2012  . Av fistula placement Left 10/08/2013    Procedure: ARTERIOVENOUS (AV) FISTULA CREATION- LEFT ARM; Radial Cephalic ;  Surgeon: Mal [name], MD;  Location: [facility];  Service: Vascular;  Laterality: Left;  . Aortic valve replacement  2011  . Cardiac valve replacement    . Aortic valve repair  1968  . Tee without cardioversion N/A 10/24/2013    Procedure: TRANSESOPHAGEAL ECHOCARDIOGRAM (TEE);  Surgeon: Dorothy Spark, MD;  Location: [facility] ENDOSCOPY;  Service: Cardiovascular;  Laterality: N/A;  Deneen Harts procedure N/A 10/28/2013    Procedure: REDO BENTALL PROCEDURE, debridment of aoritc root abscess, replacement of aortic root, ascending aorta and aortic valve with homograft. Insertion of left femoral arterial line;  Surgeon: [name], MD;  Location: [place];  Service: Open Heart Surgery;  Laterality: N/A;  . Intraoperative transesophageal echocardiogram N/A 10/28/2013    Procedure: INTRAOPERATIVE TRANSESOPHAGEAL ECHOCARDIOGRAM;  Surgeon: [name], MD;  Location: [place];  Service: Open Heart Surgery;  Laterality: N/A;    Family History  Problem Relation Age of Onset  . Heart disease Father     Social History:  reports that he has never smoked. He has never used smokeless tobacco. He reports that he does not drink alcohol or use illicit drugs.  Allergies:  Allergies  Allergen Reactions   . Oxycodone     Gives patient nightmares  . Rifampin Nausea Only    Medications: I have reviewed the patient's current medications.  Results for orders placed during the hospital encounter of 10/22/13 (from the past 48 hour(s))  GLUCOSE, CAPILLARY     Status: Abnormal   Collection Time    10/30/13  3:43 PM      Result Value Ref Range   Glucose-Capillary 111 (*) 70 - 99 mg/dL   Comment 1 Documented in Chart     Comment 2 Notify RN    GLUCOSE, CAPILLARY     Status: Abnormal   Collection Time    10/30/13  7:29 PM      Result Value Ref Range   Glucose-Capillary 103 (*) 70 - 99 mg/dL   Comment 1 Documented in Chart     Comment 2 Notify RN    GLUCOSE, CAPILLARY     Status: None   Collection Time    10/30/13 11:28 PM      Result Value Ref Range   Glucose-Capillary 89  70 - 99 mg/dL   Comment 1 Documented in Chart     Comment 2 Notify RN    GLUCOSE, CAPILLARY  Status: Abnormal   Collection Time    10/31/13  3:46 AM      Result Value Ref Range   Glucose-Capillary 105 (*) 70 - 99 mg/dL   Comment 1 Documented in Chart     Comment 2 Notify RN    COMPREHENSIVE METABOLIC PANEL     Status: Abnormal   Collection Time    10/31/13  4:10 AM      Result Value Ref Range   Sodium 138  137 - 147 mEq/L   Potassium 4.0  3.7 - 5.3 mEq/L   Comment: DELTA CHECK NOTED   Chloride 96  96 - 112 mEq/L   CO2 24  19 - 32 mEq/L   Glucose, Bld 134 (*) 70 - 99 mg/dL   BUN 16  6 - 23 mg/dL   Creatinine, Ser 2.92 (*) 0.50 - 1.35 mg/dL   Calcium 8.1 (*) 8.4 - 10.5 mg/dL   Total Protein 5.1 (*) 6.0 - 8.3 g/dL   Albumin 2.2 (*) 3.5 - 5.2 g/dL   AST 212 (*) 0 - 37 U/L   ALT 84 (*) 0 - 53 U/L   Alkaline Phosphatase 66  39 - 117 U/L   Total Bilirubin 1.1  0.3 - 1.2 mg/dL   GFR calc non Af Amer 23 (*) >90 mL/min   GFR calc Af Amer 26 (*) >90 mL/min   Comment: (NOTE)     The eGFR has been calculated using the CKD EPI equation.     This calculation has not been validated in all clinical situations.      eGFR's persistently <90 mL/min signify possible Chronic Kidney     Disease.  CBC     Status: Abnormal   Collection Time    10/31/13  4:10 AM      Result Value Ref Range   WBC 15.1 (*) 4.0 - 10.5 K/uL   RBC 3.20 (*) 4.22 - 5.81 MIL/uL   Hemoglobin 9.1 (*) 13.0 - 17.0 g/dL   HCT 27.1 (*) 39.0 - 52.0 %   MCV 84.7  78.0 - 100.0 fL   MCH 28.4  26.0 - 34.0 pg   MCHC 33.6  30.0 - 36.0 g/dL   RDW 21.0 (*) 11.5 - 15.5 %   Platelets 89 (*) 150 - 400 K/uL   Comment: CONSISTENT WITH PREVIOUS RESULT  POCT I-STAT 3, BLOOD GAS (G3+)     Status: Abnormal   Collection Time    10/31/13  6:30 AM      Result Value Ref Range   pH, Arterial 7.502 (*) 7.350 - 7.450   pCO2 arterial 34.3 (*) 35.0 - 45.0 mmHg   pO2, Arterial 94.0  80.0 - 100.0 mmHg   Bicarbonate 26.8 (*) 20.0 - 24.0 mEq/L   TCO2 28  0 - 100 mmol/L   O2 Saturation 98.0     Acid-Base Excess 4.0 (*) 0.0 - 2.0 mmol/L   Patient temperature 99.0 F     Collection site ARTERIAL LINE     Drawn by Nurse     Sample type ARTERIAL    GLUCOSE, CAPILLARY     Status: None   Collection Time    10/31/13  7:41 AM      Result Value Ref Range   Glucose-Capillary 94  70 - 99 mg/dL  GLUCOSE, CAPILLARY     Status: Abnormal   Collection Time    10/31/13 11:45 AM      Result Value Ref Range   Glucose-Capillary 106 (*)  70 - 99 mg/dL  POCT I-STAT 3, BLOOD GAS (G3+)     Status: Abnormal   Collection Time    10/31/13  1:42 PM      Result Value Ref Range   pH, Arterial 7.515 (*) 7.350 - 7.450   pCO2 arterial 31.8 (*) 35.0 - 45.0 mmHg   pO2, Arterial 107.0 (*) 80.0 - 100.0 mmHg   Bicarbonate 25.6 (*) 20.0 - 24.0 mEq/L   TCO2 27  0 - 100 mmol/L   O2 Saturation 99.0     Acid-Base Excess 3.0 (*) 0.0 - 2.0 mmol/L   Patient temperature 99.0 F     Collection site ARTERIAL LINE     Drawn by Operator     Sample type ARTERIAL    TROPONIN I     Status: Abnormal   Collection Time    10/31/13  3:18 PM      Result Value Ref Range   Troponin I >20.00 (*)  <0.30 ng/mL   Comment:            Due to the release kinetics of cTnI,     a negative result within the first hours     of the onset of symptoms does not rule out     myocardial infarction with certainty.     If myocardial infarction is still suspected,     repeat the test at appropriate intervals.     CRITICAL RESULT CALLED TO, READ BACK BY AND VERIFIED WITH:     L ROBERTSON,RN 1612 10/31/13 WBOND  CK TOTAL AND CKMB     Status: Abnormal   Collection Time    10/31/13  3:18 PM      Result Value Ref Range   Total CK 400 (*) 7 - 232 U/L   CK, MB 9.8 (*) 0.3 - 4.0 ng/mL   Comment: CRITICAL RESULT CALLED TO, READ BACK BY AND VERIFIED WITH:     L ROBERTSON,RN 1612 10/31/13 WBOND   Relative Index 2.5  0.0 - 2.5  GLUCOSE, CAPILLARY     Status: Abnormal   Collection Time    10/31/13  4:19 PM      Result Value Ref Range   Glucose-Capillary 127 (*) 70 - 99 mg/dL  GLUCOSE, CAPILLARY     Status: None   Collection Time    10/31/13  7:52 PM      Result Value Ref Range   Glucose-Capillary 90  70 - 99 mg/dL   Comment 1 Documented in Chart     Comment 2 Notify RN    TROPONIN I     Status: Abnormal   Collection Time    10/31/13  9:15 PM      Result Value Ref Range   Troponin I >20.00 (*) <0.30 ng/mL   Comment:            Due to the release kinetics of cTnI,     a negative result within the first hours     of the onset of symptoms does not rule out     myocardial infarction with certainty.     If myocardial infarction is still suspected,     repeat the test at appropriate intervals.     CRITICAL VALUE NOTED.  VALUE IS CONSISTENT WITH PREVIOUSLY REPORTED AND CALLED VALUE.  GLUCOSE, CAPILLARY     Status: None   Collection Time    11/01/13 12:03 AM      Result Value Ref Range   Glucose-Capillary 91  70 - 99 mg/dL   Comment 1 Documented in Chart     Comment 2 Notify RN    TROPONIN I     Status: Abnormal   Collection Time    11/01/13  3:00 AM      Result Value Ref Range   Troponin I  >20.00 (*) <0.30 ng/mL   Comment:            Due to the release kinetics of cTnI,     a negative result within the first hours     of the onset of symptoms does not rule out     myocardial infarction with certainty.     If myocardial infarction is still suspected,     repeat the test at appropriate intervals.     CRITICAL VALUE NOTED.  VALUE IS CONSISTENT WITH PREVIOUSLY REPORTED AND CALLED VALUE.  GLUCOSE, CAPILLARY     Status: Abnormal   Collection Time    11/01/13  3:36 AM      Result Value Ref Range   Glucose-Capillary 104 (*) 70 - 99 mg/dL   Comment 1 Documented in Chart     Comment 2 Notify RN    CBC     Status: Abnormal   Collection Time    11/01/13  4:10 AM      Result Value Ref Range   WBC 14.0 (*) 4.0 - 10.5 K/uL   RBC 3.33 (*) 4.22 - 5.81 MIL/uL   Hemoglobin 9.4 (*) 13.0 - 17.0 g/dL   HCT 28.0 (*) 39.0 - 52.0 %   MCV 84.1  78.0 - 100.0 fL   MCH 28.2  26.0 - 34.0 pg   MCHC 33.6  30.0 - 36.0 g/dL   RDW 20.6 (*) 11.5 - 15.5 %   Platelets 120 (*) 150 - 400 K/uL  COMPREHENSIVE METABOLIC PANEL     Status: Abnormal   Collection Time    11/01/13  4:10 AM      Result Value Ref Range   Sodium 137  137 - 147 mEq/L   Potassium 3.6 (*) 3.7 - 5.3 mEq/L   Chloride 94 (*) 96 - 112 mEq/L   CO2 22  19 - 32 mEq/L   Glucose, Bld 107 (*) 70 - 99 mg/dL   BUN 24 (*) 6 - 23 mg/dL   Creatinine, Ser 3.93 (*) 0.50 - 1.35 mg/dL   Calcium 7.9 (*) 8.4 - 10.5 mg/dL   Total Protein 5.2 (*) 6.0 - 8.3 g/dL   Albumin 1.9 (*) 3.5 - 5.2 g/dL   AST 137 (*) 0 - 37 U/L   ALT 70 (*) 0 - 53 U/L   Alkaline Phosphatase 72  39 - 117 U/L   Total Bilirubin 1.0  0.3 - 1.2 mg/dL   GFR calc non Af Amer 16 (*) >90 mL/min   GFR calc Af Amer 18 (*) >90 mL/min   Comment: (NOTE)     The eGFR has been calculated using the CKD EPI equation.     This calculation has not been validated in all clinical situations.     eGFR's persistently <90 mL/min signify possible Chronic Kidney     Disease.  GLUCOSE,  CAPILLARY     Status: Abnormal   Collection Time    11/01/13  8:24 AM      Result Value Ref Range   Glucose-Capillary 110 (*) 70 - 99 mg/dL   Comment 1 Notify RN      Dg Chest Okay 1 [address]  11/01/2013   CLINICAL DATA:  Status post coronary bypass grafting  EXAM: PORTABLE CHEST - 1 VIEW  COMPARISON:  10/31/2013  FINDINGS: The endotracheal tube has been removed in the interval. Left-sided central venous lines are again noted and stable. Left retrocardiac consolidation is again seen. A small left-sided pleural effusion is noted as well. A right-sided dialysis catheter is noted and stable. The right chest tube has been removed in the interval.  IMPRESSION: Slight increase in and left pleural effusion. Stable left lower lobe consolidation is noted.   Electronically Signed   By: Inez Catalina M.D.   On: 11/01/2013 08:17   Dg Chest Port 1 View  10/31/2013   CLINICAL DATA:  Status post cardiac surgery.  EXAM: PORTABLE CHEST - 1 VIEW  COMPARISON:  10/30/2013  FINDINGS: Endotracheal tube is 4.6 cm above the carina. Defibrillator pads overlying the left side of the chest. There are chest drains without a pneumothorax. There are two left jugular central lines. One catheter is coiled in the left upper chest and unchanged. The other jugular catheter is near the junction of the left subclavian vein and left innominate vein. Stable opacification at the left lung base probably represents consolidation and atelectasis. Dialysis catheter tip in the right atrium. Heart size is mildly enlarged.  IMPRESSION: Stable position of support apparatuses as described.  Negative for a pneumothorax.  Consolidation at the left lung base.   Electronically Signed   By: Markus Daft M.D.   On: 10/31/2013 08:48    Positive for anxiety, bad mood, depression and sleep disturbance Blood pressure 135/70, pulse 94, temperature 97.6 F (36.4 C), temperature source Oral, resp. rate 25, height 6' 1.5" (1.867 m), weight 98.5 kg (217 lb 2.5 oz), SpO2  98.00%.   Assessment/Plan: Major depressive disorder, recurrent Adjustment disorder with mixed symptoms of anxiety and mood  Recommendation: Restart Remeron M-tab 15 mg at bedtime for depression and insomnia  Restart Sertraline 50 mg for depression and continue Klonopin 1 mg PO BID for anxiety Appreciate psychiatric consultation Followup as clinically required   ,JANARDHAHA R. 11/01/2013, 12:14 PM

## 2013-11-01 NOTE — Evaluation (Signed)
Physical Therapy Evaluation Patient Details Name: Bradley [name] MRN: [id number] DOB: 29-Mar-1956 Today's Date: 11/01/2013   History of Present Illness    This is a 58 year old man with hx of prosthetic aortic valve replacement in 2008 and with aortic root correction in 2011 who was admitted on February 10th with severe MSSA bacteremia and endocarditis with septic emboli to brain. He was initally broadly treated then appropriately narrowed to nafcillin, gentamicin and rifampin. His blood cultures cleared on 2/11/5. He unfortunately developed renal failure and gentamicin was held. He was treated with IV nafcillin and IV rifampin 300 mg IV q 8 hours thru end of February when rifampin changed to 600mg  daily.He was ultimately discharged on 10/10/13 with vancomycin and oral rifampin because he lost IV access besided HD site. HE has AV fistula created in left forearm for HD. His last dose of vanocomcyin on 3/24. He was seen at the infectious disease clinic on 3/25 weight complained of pain in his chest with deep breathing, and on exam he was found to have tenderness and erythema on the left sternoclavicular joint area. He was then referred to the hospitalist service for further evaluation and treatment. 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 Pt had redo sternotomy and Bentall procedure.       Clinical Impression  Pt admitted with above. Pt currently with functional limitations due to the deficits listed below (see PT Problem List).  Pt will benefit from skilled PT to increase their independence and safety with mobility to allow discharge to the venue listed below.     Follow Up Recommendations CIR;Supervision/Assistance - 24 hour    Equipment Recommendations  Rolling walker with 5" wheels;3in1 (PT)    Recommendations  for Other Services Rehab consult     Precautions / Restrictions Precautions Precautions: Fall;Sternal Precaution Comments: Pt needs reminders for sternal precautions.  Restrictions Weight Bearing Restrictions: No      Mobility  Bed Mobility Overal bed mobility: Needs Assistance;+2 for physical assistance Bed Mobility: Supine to Sit Rolling: Min assist;+2 for physical assistance   Supine to sit: Max assist;+2 for physical assistance     General bed mobility comments: Needed assist secondary to sternal precautions.  Needed cues to hold pillow.   Transfers Overall transfer level: Needs assistance Equipment used: None Transfers: Sit to/from [facility] Sit to Stand: Mod assist;+2 physical assistance Stand pivot transfers: Mod assist;+2 physical assistance       General transfer comment: needed cues to not use UEs but was able to perform sit to stand and to pivot with guidance to chair.    Ambulation/Gait                Stairs            Wheelchair Mobility    Modified Rankin (Stroke Patients Only)       Balance Overall balance assessment: Needs assistance;History of Falls Sitting-balance support: No upper extremity supported;Feet supported Sitting balance-Leahy Scale: Fair     Standing balance support: No upper extremity supported;During functional activity Standing balance-Leahy Scale: Poor Standing balance comment: Has to have steadying assist and min assist to stand.  Pertinent Vitals/Pain VSS, some incisional  pain    Home Living Family/patient expects to be discharged to:: Private residence Living Arrangements: Spouse/significant other;Children;Non-relatives/Friends Available Help at Discharge: Family;Available 24 hours/day Type of Home: House Home Access: Stairs to enter Entrance Stairs-Rails: Can reach both Entrance Stairs-Number of Steps: 5 Home Layout: One level Home Equipment:  Shower seat - built in      Prior Function Level of Independence: Needs assistance   Gait / Transfers Assistance Needed: Ambulated with RW  ADL's / Homemaking Assistance Needed: Assist         Hand Dominance   Dominant Hand: Right    Extremity/Trunk Assessment   Upper Extremity Assessment: Defer to OT evaluation           Lower Extremity Assessment: RLE deficits/detail;LLE deficits/detail RLE Deficits / Details: grossly 3-/5 LLE Deficits / Details: grossly 3-/5     Communication   Communication: Expressive difficulties  Cognition Arousal/Alertness: Awake/alert Behavior During Therapy: WFL for tasks assessed/performed Overall Cognitive Status: Impaired/Different from baseline Area of Impairment: Problem solving;Attention Orientation Level: Time;Situation Current Attention Level: Selective Memory: Decreased short-term memory Following Commands: Follows multi-step commands with increased time Safety/Judgement: Decreased awareness of safety Awareness: Emergent Problem Solving: Decreased initiation      General Comments      Exercises        Assessment/Plan    PT Assessment Patient needs continued PT services  PT Diagnosis Generalized weakness   PT Problem List Decreased activity tolerance;Decreased balance;Decreased mobility;Decreased knowledge of use of DME;Decreased safety awareness;Decreased knowledge of precautions  PT Treatment Interventions DME instruction;Gait training;Functional mobility training;Therapeutic activities;Therapeutic exercise;Balance training;Neuromuscular re-education;Patient/family education;Cognitive remediation   PT Goals (Current goals can be found in the Care Plan section) Acute Rehab PT Goals Patient Stated Goal: to go home PT Goal Formulation: With patient Time For Goal Achievement: 11/08/13 Potential to Achieve Goals: Good    Frequency Min 3X/week   Barriers to discharge        Co-evaluation               End  of Session Equipment Utilized During Treatment: Gait belt;Oxygen Activity Tolerance: Patient limited by fatigue Patient left: in chair;with call bell/phone within reach Nurse Communication: Mobility status         Time: [id number] PT Time Calculation (min): 30 min   Charges:   PT Evaluation $Initial PT Evaluation Tier I: 1 Procedure PT Treatments $Therapeutic Activity: 23-37 mins   PT G Codes:          INGOLD,Lokelani Lutes Nov 11, 2013, 4:10 PM [facility] [phone] (pager)

## 2013-11-01 NOTE — Progress Notes (Signed)
[place] for Infectious Disease   Day # 3 nafcillin Day # 3 rifampin Day #1 gentamicin   Subjective: Intubated but following commands, seems to want his restraints off  Antibiotics:  Anti-infectives   Start     Dose/Rate Route Frequency Ordered Stop   11/01/13 1200  gentamicin (GARAMYCIN) IVPB 100 mg     100 mg 200 mL/hr over 30 Minutes Intravenous Every T-Th-Sa (Hemodialysis) 10/30/13 1037     10/31/13 1200  gentamicin (GARAMYCIN) IVPB 80 mg  Status:  Discontinued     80 mg 100 mL/hr over 30 Minutes Intravenous Every Dialysis 10/30/13 0047 10/30/13 1027   10/30/13 1300  gentamicin (GARAMYCIN) 120 mg in dextrose 5 % 50 mL IVPB     120 mg 106 mL/hr over 30 Minutes Intravenous  Once 10/30/13 1037 10/30/13 1615   10/30/13 0500  gentamicin (GARAMYCIN) IVPB 80 mg     80 mg 100 mL/hr over 30 Minutes Intravenous  Once 10/30/13 0047 10/30/13 0526   10/29/13 1600  levofloxacin (LEVAQUIN) IVPB 500 mg  Status:  Discontinued     500 mg 100 mL/hr over 60 Minutes Intravenous Every 48 hours 10/27/13 1457 10/29/13 0857   10/29/13 1000  rifampin (RIFADIN) 300 mg in sodium chloride 0.9 % 100 mL IVPB     300 mg 200 mL/hr over 30 Minutes Intravenous 3 times per day 10/29/13 0858     10/29/13 1000  nafcillin 2 g in dextrose 5 % 50 mL IVPB     2 g 100 mL/hr over 30 Minutes Intravenous 6 times per day 10/29/13 0901     10/29/13 0900  nafcillin injection 2 g  Status:  Discontinued     2 g Intravenous Every 4 hours 10/29/13 0857 10/29/13 0900   10/29/13 0500  vancomycin (VANCOCIN) IVPB 1000 mg/200 mL premix     1,000 mg 200 mL/hr over 60 Minutes Intravenous  Once 10/28/13 2146 10/29/13 0600   10/29/13 0100  cefUROXime (ZINACEF) 1.5 g in dextrose 5 % 50 mL IVPB  Status:  Discontinued     1.5 g 100 mL/hr over 30 Minutes Intravenous Every 12 hours 10/28/13 2146 10/29/13 0857   10/28/13 0400  vancomycin (VANCOCIN) 1,250 mg in sodium chloride 0.9 % 250 mL IVPB     1,250 mg 166.7 mL/hr  over 90 Minutes Intravenous To Surgery 10/27/13 1450 10/28/13 0905   10/28/13 0400  cefUROXime (ZINACEF) 1.5 g in dextrose 5 % 50 mL IVPB     1.5 g 100 mL/hr over 30 Minutes Intravenous To Surgery 10/27/13 1450 10/28/13 2030   10/28/13 0400  cefUROXime (ZINACEF) 750 mg in dextrose 5 % 50 mL IVPB  Status:  Discontinued     750 mg 100 mL/hr over 30 Minutes Intravenous To Surgery 10/27/13 1447 10/28/13 2114   10/27/13 1600  levofloxacin (LEVAQUIN) IVPB 750 mg     750 mg 100 mL/hr over 90 Minutes Intravenous  Once 10/27/13 1456 10/28/13 0231   10/23/13 0800  ceFAZolin (ANCEF) IVPB 1 g/50 mL premix  Status:  Discontinued     1 g 100 mL/hr over 30 Minutes Intravenous  Once 10/22/13 2313 10/22/13 2326   10/22/13 2359  ceFAZolin (ANCEF) IVPB 2 g/50 mL premix  Status:  Discontinued     2 g 100 mL/hr over 30 Minutes Intravenous Every M-W-F (Hemodialysis) 10/22/13 2314 10/28/13 2146   10/22/13 2315  ceFAZolin (ANCEF) IVPB 2 g/50 mL premix  Status:  Discontinued     2  g 100 mL/hr over 30 Minutes Intravenous  Once 10/22/13 2313 10/22/13 2326      Medications: Scheduled Meds: . acetaminophen  1,000 mg Oral 4 times per day  . amiodarone  200 mg Oral Daily  . aspirin EC  325 mg Oral Daily   Or  . aspirin  324 mg Per Tube Daily  . bisacodyl  10 mg Oral Daily   Or  . bisacodyl  10 mg Rectal Daily  . chlorhexidine  15 mL Mouth Rinse BID  . clonazePAM  1 mg Oral BID  . docusate sodium  200 mg Oral Daily  . enoxaparin (LOVENOX) injection  30 mg Subcutaneous Q24H  . gentamicin  100 mg Intravenous Q T,Th,Sa-HD  . insulin aspart  0-24 Units Subcutaneous 6 times per day  . levothyroxine  200 mcg Oral QAC breakfast  . metoprolol tartrate  12.5 mg Oral BID   Or  . metoprolol tartrate  12.5 mg Per Tube BID  . nafcillin IV  2 g Intravenous 6 times per day  . pantoprazole  40 mg Oral Daily  . rifampin (RIFADIN) IVPB  300 mg Intravenous 3 times per day  . sodium chloride  3 mL Intravenous Q12H    Continuous Infusions: . sodium chloride Stopped (10/30/13 0700)  . sodium chloride 20 mL/hr at 11/01/13 0700  . DOPamine 5 mcg/kg/min (11/01/13 1000)  . lactated ringers Stopped (10/29/13 1900)  . phenylephrine (NEO-SYNEPHRINE) Adult infusion 15 mcg/min (11/01/13 0800)   PRN Meds:.fentaNYL, HYDROcodone-acetaminophen, metoprolol, ondansetron (ZOFRAN) IV, sodium chloride   Objective: Weight change: 9 lb 14.7 oz (4.5 kg)  Intake/Output Summary (Last 24 hours) at 11/01/13 1238 Last data filed at 11/01/13 0900  Gross per 24 hour  Intake 1675.6 ml  Output    350 ml  Net 1325.6 ml   Blood pressure 135/70, pulse 94, temperature 97.6 F (36.4 C), temperature source Oral, resp. rate 25, height 6' 1.5" (1.867 m), weight 217 lb 2.5 oz (98.5 kg), SpO2 98.00%. Temp:  [97.6 F (36.4 C)-99.1 F (37.3 C)] 97.6 F (36.4 C) (04/04 1133) Pulse Rate:  [34-127] 94 (04/04 0700) Resp:  [0-33] 25 (04/04 0900) BP: (109-139)/(54-80) 135/70 mmHg (04/04 0900) SpO2:  [38 %-100 %] 98 % (04/04 0900) Weight:  [217 lb 2.5 oz (98.5 kg)] 217 lb 2.5 oz (98.5 kg) (04/04 0500)  Physical Exam: General: awake alert, extubated and ao  X 3 HEENT: anicteric sclera, , EOMI CVS tachy rate, normal r,   Chest: fairly clear to auscultation bilaterally, Abdomen: soft nontender, nondistended, normal bowel sounds, Extremities: no  clubbing or edema noted bilaterally Skin: sternal wound dressed, CT with serosanguinous material  Neuro: nonfocal  Lab Results:  Recent Labs  10/31/13 0410 11/01/13 0410  WBC 15.1* 14.0*  HGB 9.1* 9.4*  HCT 27.1* 28.0*  PLT 89* 120*    BMET  Recent Labs  10/31/13 0410 11/01/13 0410  NA 138 137  K 4.0 3.6*  CL 96 94*  CO2 24 22  GLUCOSE 134* 107*  BUN 16 24*  CREATININE 2.92* 3.93*  CALCIUM 8.1* 7.9*    Micro Results: Recent Results (from the past 240 hour(s))  CLOSTRIDIUM DIFFICILE BY PCR     Status: None   Collection Time    10/22/13 10:04 PM      Result Value  Ref Range Status   C difficile by pcr NEGATIVE  NEGATIVE Final  CULTURE, BLOOD (ROUTINE X 2)     Status: None   Collection Time  10/22/13 10:26 PM      Result Value Ref Range Status   Specimen Description BLOOD RIGHT ARM   Final   Special Requests BOTTLES DRAWN AEROBIC ONLY 3CC   Final   Culture  Setup Time     Final   Value: 10/23/2013 04:42     Performed at [facility]   Culture     Final   Value: NO GROWTH 5 DAYS     Performed at [facility]   Report Status 10/29/2013 FINAL   Final  CULTURE, BLOOD (ROUTINE X 2)     Status: None   Collection Time    10/22/13 10:34 PM      Result Value Ref Range Status   Specimen Description BLOOD RIGHT HAND   Final   Special Requests BOTTLES DRAWN AEROBIC ONLY [facility]   Final   Culture  Setup Time     Final   Value: 10/23/2013 04:42     Performed at [facility]   Culture     Final   Value: NO GROWTH 5 DAYS     Performed at [facility]   Report Status 10/29/2013 FINAL   Final  SURGICAL PCR SCREEN     Status: None   Collection Time    10/27/13  1:13 PM      Result Value Ref Range Status   MRSA, PCR NEGATIVE  NEGATIVE Final   Staphylococcus aureus NEGATIVE  NEGATIVE Final   Comment:            The Xpert SA Assay (FDA     approved for NASAL specimens     in patients over 57 years of age),     is one component of     a comprehensive surveillance     program.  Test performance has     been validated by [employer] for patients greater     than or equal to 39 year old.     It is not intended     to diagnose infection nor to     guide or monitor treatment.  TISSUE CULTURE     Status: None   Collection Time    10/28/13  1:20 PM      Result Value Ref Range Status   Specimen Description TISSUE AORTA   Final   Special Requests PT ON VANCOMYCIN AND ZINACEF   Final   Gram Stain     Final   Value: RARE WBC PRESENT,BOTH PMN AND MONONUCLEAR     NO ORGANISMS SEEN     Performed at [facility]    Culture     Final   Value: NO GROWTH 3 DAYS     Performed at [facility]   Report Status 10/31/2013 FINAL   Final  GRAM STAIN     Status: None   Collection Time    10/28/13  2:26 PM      Result Value Ref Range Status   Specimen Description ABSCESS AORTA   Final   Special Requests PT ON VANCOMYCIN AND ZINACEF   Final   Gram Stain     Final   Value: RARE WBC PRESENT, PREDOMINANTLY PMN     NO ORGANISMS SEEN   Report Status 10/28/2013 FINAL   Final  ANAEROBIC CULTURE     Status: None   Collection Time    10/28/13  2:26 PM      Result Value Ref Range Status  Specimen Description ABSCESS AORTA   Final   Special Requests PT ON VANCOMYCIN AND ZINACEF   Final   Gram Stain     Final   Value: RARE WBC PRESENT,BOTH PMN AND MONONUCLEAR     NO ORGANISMS SEEN     Performed at [facility]   Culture     Final   Value: NO ANAEROBES ISOLATED; CULTURE IN PROGRESS FOR 5 DAYS     Performed at [facility]   Report Status PENDING   Incomplete  CULTURE, ROUTINE-ABSCESS     Status: None   Collection Time    10/28/13  2:26 PM      Result Value Ref Range Status   Specimen Description ABSCESS AORTA   Final   Special Requests PT ON VANCOMYCIN AND ZINACEF   Final   Gram Stain     Final   Value: RARE WBC PRESENT, PREDOMINANTLY PMN     NO SQUAMOUS EPITHELIAL CELLS SEEN     NO ORGANISMS SEEN     Performed at [facility]     Performed at [facility]   Culture     Final   Value: NO GROWTH 3 DAYS     Performed at [facility]   Report Status 10/31/2013 FINAL   Final    Studies/Results: Dg Chest Port 1 View  11/01/2013   CLINICAL DATA:  Status post coronary bypass grafting  EXAM: PORTABLE CHEST - 1 VIEW  COMPARISON:  10/31/2013  FINDINGS: The endotracheal tube has been removed in the interval. Left-sided central venous lines are again noted and stable. Left retrocardiac consolidation is again seen. A small left-sided pleural effusion is noted as well. A  right-sided dialysis catheter is noted and stable. The right chest tube has been removed in the interval.  IMPRESSION: Slight increase in and left pleural effusion. Stable left lower lobe consolidation is noted.   Electronically Signed   By: Inez Catalina M.D.   On: 11/01/2013 08:17   Dg Chest Port 1 View  10/31/2013   CLINICAL DATA:  Status post cardiac surgery.  EXAM: PORTABLE CHEST - 1 VIEW  COMPARISON:  10/30/2013  FINDINGS: Endotracheal tube is 4.6 cm above the carina. Defibrillator pads overlying the left side of the chest. There are chest drains without a pneumothorax. There are two left jugular central lines. One catheter is coiled in the left upper chest and unchanged. The other jugular catheter is near the junction of the left subclavian vein and left innominate vein. Stable opacification at the left lung base probably represents consolidation and atelectasis. Dialysis catheter tip in the right atrium. Heart size is mildly enlarged.  IMPRESSION: Stable position of support apparatuses as described.  Negative for a pneumothorax.  Consolidation at the left lung base.   Electronically Signed   By: Markus Daft M.D.   On: 10/31/2013 08:48      Assessment/Plan: Bradley [name] is a 58 y.o. 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 WE would use for only 2 weeks and would use as low a dose as possible   With re to Naf vs Ancef we could  repeat MRI  of the brain if we wanted to go back to ancef (since CNS penetration is lousy with this) though at present with temp PM in not a good idea, can wait for later        LOS: 10 days   Alcide Evener 11/01/2013, 12:38 PM

## 2013-11-01 NOTE — Progress Notes (Signed)
Patient's troponin came back >20. MD notified. Requested EKG to be ordered for the AM. EKG ordered for 11/01/13 at 0500.  Will continue to monitor.

## 2013-11-01 NOTE — Progress Notes (Signed)
Patient ID: Bradley [name], male   DOB: 1956-04-24, 58 y.o.   MRN: [id number]   [facility] Progress Note   Assessment/ Plan:   1 Paravalvular abscess/Prosthetic valve endocarditis: on nafcillin/RFM/Gent, 4th day post debridement of aortic root abscess, replacement of aortic root, ascending aorta and aortic valve with homograft. Extubated yesterday.  2 ESRD on hemodialysis- Last HD Thursday- he request not to have HD today. Plan for HD on Monday to get to MWF schedule. He has volume excess but remains on pressors that limit UF. He is averse to doing dialysis today (which appears to be acceptable based on my review of his labs/physical exam) 3 Recent MSSA bacteremia w septic cerebral emboli / endocarditis: now s/p debridement of aortic root abscess and root replacement with homograft. On ABx.  4 Anemia Hb 10 on darbe 100/wk - re-dose iron as he continues to improve  5 HTN/volume- on pressors, hypervolemic (interstitial) on exam  Subjective:   Events from overnight noted- inability to get OOB to chair and elevated TnI. States that he is scared after the "episode" he had yesterday and inquires if he can get a "break from everything" today.   Objective:   BP 139/66  Pulse 94  Temp(Src) 98.6 F (37 C) (Core (Comment))  Resp 30  Ht 6' 1.5" (1.867 m)  Wt 98.5 kg (217 lb 2.5 oz)  BMI 28.26 kg/m2  SpO2 100%  Physical Exam: [id number] resting in bed [id number] RRR, normal S1 and muffled S2 Resp:Coarse BS bilaterally, no rales/rhonchi [id number], flat, NT, BS normal [id number] LE edema, 1-2+ UE edema  Labs: BMET  Recent Labs Lab 10/26/13 0626 10/27/13 1833  10/29/13 0400 10/29/13 1508 10/29/13 1514 10/29/13 1600 10/29/13 1601 10/30/13 0400 10/31/13 0410 11/01/13 0410  NA 136* 136*  < > 135* 137 134*  --  135* 137 138 137  K 4.5 4.4  < > 5.6* 6.3* 5.7*  --  4.4 5.6* 4.0 3.6*  CL 97 96  --  101 97 102  --  102 97 96 94*  CO2 27 27  --  20 21  --   --   --  18* 24  22  GLUCOSE 96 100*  < > 114* 159* 156*  --  150* 140* 134* 107*  BUN 5* 12  --  12 15 17   --  14 20 16  24*  CREATININE 2.42* 3.16*  --  2.37* 2.92* 3.20* 2.97* 3.40* 3.62* 2.92* 3.93*  CALCIUM 8.4 8.5  --  9.3 9.3  --   --   --  9.0 8.1* 7.9*  PHOS 2.6  --   --   --  5.5*  --   --   --   --   --   --   < > = values in this interval not displayed. CBC  Recent Labs Lab 10/29/13 1600 10/29/13 1601 10/30/13 0400 10/31/13 0410 11/01/13 0410  WBC 20.1*  --  20.2* 15.1* 14.0*  HGB 9.8* 10.2* 8.7* 9.1* 9.4*  HCT 30.0* 30.0* 26.3* 27.1* 28.0*  MCV 86.7  --  85.7 84.7 84.1  PLT 123*  --  117* 89* 120*   Medications:    . acetaminophen  1,000 mg Oral 4 times per day  . amiodarone  200 mg Oral Daily  . aspirin EC  325 mg Oral Daily   Or  . aspirin  324 mg Per Tube Daily  . bisacodyl  10 mg Oral Daily   Or  . bisacodyl  10 mg Rectal Daily  . chlorhexidine  15 mL Mouth Rinse BID  . docusate sodium  200 mg Oral Daily  . gentamicin  100 mg Intravenous Q T,Th,Sa-HD  . insulin aspart  0-24 Units Subcutaneous 6 times per day  . levothyroxine  200 mcg Oral QAC breakfast  . metoprolol tartrate  12.5 mg Oral BID   Or  . metoprolol tartrate  12.5 mg Per Tube BID  . nafcillin IV  2 g Intravenous 6 times per day  . pantoprazole  40 mg Oral Daily  . rifampin (RIFADIN) IVPB  300 mg Intravenous 3 times per day  . sodium chloride  3 mL Intravenous Q12H   Elmarie Shiley, MD 11/01/2013, 8:22 AM

## 2013-11-01 NOTE — Progress Notes (Addendum)
4 Days Post-Op Procedure(s) (LRB): REDO STERNOTOMY (N/A) REDO BENTALL PROCEDURE, debridment of aoritc root abscess, replacement of aortic root, ascending aorta and aortic valve with homograft. Insertion of left femoral arterial line (N/A) INTRAOPERATIVE TRANSESOPHAGEAL ECHOCARDIOGRAM (N/A) Subjective: Patient emotional, c/o some extremity numbness Able to stand better today Maintaining nsr, weaning neo Objective: Vital signs in last 24 hours: Temp:  [98.3 F (36.8 C)-99.1 F (37.3 C)] 98.5 F (36.9 C) (04/04 0900) Pulse Rate:  [34-127] 94 (04/04 0700) Cardiac Rhythm:  [-] Normal sinus rhythm (04/04 0800) Resp:  [0-33] 25 (04/04 0900) BP: (109-139)/(54-80) 135/70 mmHg (04/04 0900) SpO2:  [38 %-100 %] 98 % (04/04 0900) Weight:  [217 lb 2.5 oz (98.5 kg)] 217 lb 2.5 oz (98.5 kg) (04/04 0500)  Hemodynamic parameters for last 24 hours: CVP:  [3 mmHg-17 mmHg] 16 mmHg  Intake/Output from previous day: 04/03 0701 - 04/04 0700 In: 2058.5 [P.O.:120; I.V.:1338.5; IV Piggyback:600] Out: 430 [Urine:370; Chest Tube:60] Intake/Output this shift: Total I/O In: 131.7 [I.V.:81.7; IV Piggyback:50] Out: 30 [Urine:30]  Lungs clear Mild edema cxr clear + BM Lab Results:  Recent Labs  10/31/13 0410 11/01/13 0410  WBC 15.1* 14.0*  HGB 9.1* 9.4*  HCT 27.1* 28.0*  PLT 89* 120*   BMET:  Recent Labs  10/31/13 0410 11/01/13 0410  NA 138 137  K 4.0 3.6*  CL 96 94*  CO2 24 22  GLUCOSE 134* 107*  BUN 16 24*  CREATININE 2.92* 3.93*  CALCIUM 8.1* 7.9*    PT/INR:  Recent Labs  10/29/13 1508  LABPROT 17.8*  INR 1.51*   ABG    Component Value Date/Time   PHART 7.515* 10/31/2013 1342   HCO3 25.6* 10/31/2013 1342   TCO2 27 10/31/2013 1342   ACIDBASEDEF 1.0 10/29/2013 1536   O2SAT 99.0 10/31/2013 1342   CBG (last 3)   Recent Labs  11/01/13 0003 11/01/13 0336 11/01/13 0824  GLUCAP 91 104* 110*    Assessment/Plan: S/P Procedure(s) (LRB): REDO STERNOTOMY (N/A) REDO BENTALL  PROCEDURE, debridment of aoritc root abscess, replacement of aortic root, ascending aorta and aortic valve with homograft. Insertion of left femoral arterial line (N/A) INTRAOPERATIVE TRANSESOPHAGEAL ECHOCARDIOGRAM (N/A) OOB to chair No HD today Hold laxatives Resume home dose klonopin for paranoia, anxiety  LOS: 10 days    VAN TRIGT III,Xandrea Clarey 11/01/2013

## 2013-11-01 NOTE — Progress Notes (Signed)
[place]    Day # 4 nafcillin Day # 4 rifampin Day #2 gentamicin   Subjective: C/o nightmares still  Antibiotics:  Anti-infectives   Start     Dose/Rate Route Frequency Ordered Stop   11/01/13 1200  gentamicin (GARAMYCIN) IVPB 100 mg     100 mg 200 mL/hr over 30 Minutes Intravenous Every T-Th-Sa (Hemodialysis) 10/30/13 1037     10/31/13 1200  gentamicin (GARAMYCIN) IVPB 80 mg  Status:  Discontinued     80 mg 100 mL/hr over 30 Minutes Intravenous Every Dialysis 10/30/13 0047 10/30/13 1027   10/30/13 1300  gentamicin (GARAMYCIN) 120 mg in dextrose 5 % 50 mL IVPB     120 mg 106 mL/hr over 30 Minutes Intravenous  Once 10/30/13 1037 10/30/13 1615   10/30/13 0500  gentamicin (GARAMYCIN) IVPB 80 mg     80 mg 100 mL/hr over 30 Minutes Intravenous  Once 10/30/13 0047 10/30/13 0526   10/29/13 1600  levofloxacin (LEVAQUIN) IVPB 500 mg  Status:  Discontinued     500 mg 100 mL/hr over 60 Minutes Intravenous Every 48 hours 10/27/13 1457 10/29/13 0857   10/29/13 1000  rifampin (RIFADIN) 300 mg in sodium chloride 0.9 % 100 mL IVPB     300 mg 200 mL/hr over 30 Minutes Intravenous 3 times per day 10/29/13 0858     10/29/13 1000  nafcillin 2 g in dextrose 5 % 50 mL IVPB     2 g 100 mL/hr over 30 Minutes Intravenous 6 times per day 10/29/13 0901     10/29/13 0900  nafcillin injection 2 g  Status:  Discontinued     2 g Intravenous Every 4 hours 10/29/13 0857 10/29/13 0900   10/29/13 0500  vancomycin (VANCOCIN) IVPB 1000 mg/200 mL premix     1,000 mg 200 mL/hr over 60 Minutes Intravenous  Once 10/28/13 2146 10/29/13 0600   10/29/13 0100  cefUROXime (ZINACEF) 1.5 g in dextrose 5 % 50 mL IVPB  Status:  Discontinued     1.5 g 100 mL/hr over 30 Minutes Intravenous Every 12 hours 10/28/13 2146 10/29/13 0857   10/28/13 0400  vancomycin (VANCOCIN) 1,250 mg in sodium chloride 0.9 % 250 mL IVPB     1,250 mg 166.7 mL/hr over 90 Minutes Intravenous To Surgery 10/27/13  1450 10/28/13 0905   10/28/13 0400  cefUROXime (ZINACEF) 1.5 g in dextrose 5 % 50 mL IVPB     1.5 g 100 mL/hr over 30 Minutes Intravenous To Surgery 10/27/13 1450 10/28/13 2030   10/28/13 0400  cefUROXime (ZINACEF) 750 mg in dextrose 5 % 50 mL IVPB  Status:  Discontinued     750 mg 100 mL/hr over 30 Minutes Intravenous To Surgery 10/27/13 1447 10/28/13 2114   10/27/13 1600  levofloxacin (LEVAQUIN) IVPB 750 mg     750 mg 100 mL/hr over 90 Minutes Intravenous  Once 10/27/13 1456 10/28/13 0231   10/23/13 0800  ceFAZolin (ANCEF) IVPB 1 g/50 mL premix  Status:  Discontinued     1 g 100 mL/hr over 30 Minutes Intravenous  Once 10/22/13 2313 10/22/13 2326   10/22/13 2359  ceFAZolin (ANCEF) IVPB 2 g/50 mL premix  Status:  Discontinued     2 g 100 mL/hr over 30 Minutes Intravenous Every M-W-F (Hemodialysis) 10/22/13 2314 10/28/13 2146   10/22/13 2315  ceFAZolin (ANCEF) IVPB 2 g/50 mL premix  Status:  Discontinued     2 g 100 mL/hr over 30 Minutes  Intravenous  Once 10/22/13 2313 10/22/13 2326      Medications: Scheduled Meds: . acetaminophen  1,000 mg Oral 4 times per day  . amiodarone  200 mg Oral Daily  . aspirin EC  325 mg Oral Daily   Or  . aspirin  324 mg Per Tube Daily  . bisacodyl  10 mg Oral Daily   Or  . bisacodyl  10 mg Rectal Daily  . chlorhexidine  15 mL Mouth Rinse BID  . clonazePAM  1 mg Oral BID  . docusate sodium  200 mg Oral Daily  . enoxaparin (LOVENOX) injection  30 mg Subcutaneous Q24H  . gentamicin  100 mg Intravenous Q T,Th,Sa-HD  . insulin aspart  0-24 Units Subcutaneous 6 times per day  . levothyroxine  200 mcg Oral QAC breakfast  . metoprolol tartrate  12.5 mg Oral BID   Or  . metoprolol tartrate  12.5 mg Per Tube BID  . nafcillin IV  2 g Intravenous 6 times per day  . pantoprazole  40 mg Oral Daily  . rifampin (RIFADIN) IVPB  300 mg Intravenous 3 times per day  . sodium chloride  3 mL Intravenous Q12H   Continuous Infusions: . sodium chloride Stopped  (10/30/13 0700)  . sodium chloride 20 mL/hr at 11/01/13 0700  . DOPamine 5 mcg/kg/min (11/01/13 1000)  . lactated ringers Stopped (10/29/13 1900)  . phenylephrine (NEO-SYNEPHRINE) Adult infusion 15 mcg/min (11/01/13 0800)   PRN Meds:.fentaNYL, HYDROcodone-acetaminophen, metoprolol, ondansetron (ZOFRAN) IV, sodium chloride   Objective: Weight change: 9 lb 14.7 oz (4.5 kg)  Intake/Output Summary (Last 24 hours) at 11/01/13 1241 Last data filed at 11/01/13 0900  Gross per 24 hour  Intake 1675.6 ml  Output    350 ml  Net 1325.6 ml   Blood pressure 135/70, pulse 94, temperature 97.6 F (36.4 C), temperature source Oral, resp. rate 25, height 6' 1.5" (1.867 m), weight 217 lb 2.5 oz (98.5 kg), SpO2 98.00%. Temp:  [97.6 F (36.4 C)-99.1 F (37.3 C)] 97.6 F (36.4 C) (04/04 1133) Pulse Rate:  [34-127] 94 (04/04 0700) Resp:  [0-33] 25 (04/04 0900) BP: (109-139)/(54-80) 135/70 mmHg (04/04 0900) SpO2:  [38 %-100 %] 98 % (04/04 0900) Weight:  [217 lb 2.5 oz (98.5 kg)] 217 lb 2.5 oz (98.5 kg) (04/04 0500)  Physical Exam: General: awake alert, extubated and ao  X 3 HEENT: anicteric sclera, , EOMI CVS tachy rate, normal r,   Chest: fairly clear to auscultation bilaterally, Abdomen: soft nontender, nondistended, normal bowel sounds, Extremities: no  clubbing or edema noted bilaterally Skin: sternal wound dressed, CT with serosanguinous material  Neuro: nonfocal  Lab Results:  Recent Labs  10/31/13 0410 11/01/13 0410  WBC 15.1* 14.0*  HGB 9.1* 9.4*  HCT 27.1* 28.0*  PLT 89* 120*    BMET  Recent Labs  10/31/13 0410 11/01/13 0410  NA 138 137  K 4.0 3.6*  CL 96 94*  CO2 24 22  GLUCOSE 134* 107*  BUN 16 24*  CREATININE 2.92* 3.93*  CALCIUM 8.1* 7.9*    Micro Results: Recent Results (from the past 240 hour(s))  CLOSTRIDIUM DIFFICILE BY PCR     Status: None   Collection Time    10/22/13 10:04 PM      Result Value Ref Range Status   C difficile by pcr NEGATIVE   NEGATIVE Final  CULTURE, BLOOD (ROUTINE X 2)     Status: None   Collection Time    10/22/13 10:26 PM  Result Value Ref Range Status   Specimen Description BLOOD RIGHT ARM   Final   Special Requests BOTTLES DRAWN AEROBIC ONLY 3CC   Final   Culture  Setup Time     Final   Value: 10/23/2013 04:42     Performed at [facility]   Culture     Final   Value: NO GROWTH 5 DAYS     Performed at [facility]   Report Status 10/29/2013 FINAL   Final  CULTURE, BLOOD (ROUTINE X 2)     Status: None   Collection Time    10/22/13 10:34 PM      Result Value Ref Range Status   Specimen Description BLOOD RIGHT HAND   Final   Special Requests BOTTLES DRAWN AEROBIC ONLY [facility]   Final   Culture  Setup Time     Final   Value: 10/23/2013 04:42     Performed at [facility]   Culture     Final   Value: NO GROWTH 5 DAYS     Performed at [facility]   Report Status 10/29/2013 FINAL   Final  SURGICAL PCR SCREEN     Status: None   Collection Time    10/27/13  1:13 PM      Result Value Ref Range Status   MRSA, PCR NEGATIVE  NEGATIVE Final   Staphylococcus aureus NEGATIVE  NEGATIVE Final   Comment:            The Xpert SA Assay (FDA     approved for NASAL specimens     in patients over 87 years of age),     is one component of     a comprehensive surveillance     program.  Test performance has     been validated by [employer] for patients greater     than or equal to 13 year old.     It is not intended     to diagnose infection nor to     guide or monitor treatment.  TISSUE CULTURE     Status: None   Collection Time    10/28/13  1:20 PM      Result Value Ref Range Status   Specimen Description TISSUE AORTA   Final   Special Requests PT ON VANCOMYCIN AND ZINACEF   Final   Gram Stain     Final   Value: RARE WBC PRESENT,BOTH PMN AND MONONUCLEAR     NO ORGANISMS SEEN     Performed at [facility]   Culture     Final   Value: NO GROWTH 3 DAYS      Performed at [facility]   Report Status 10/31/2013 FINAL   Final  GRAM STAIN     Status: None   Collection Time    10/28/13  2:26 PM      Result Value Ref Range Status   Specimen Description ABSCESS AORTA   Final   Special Requests PT ON VANCOMYCIN AND ZINACEF   Final   Gram Stain     Final   Value: RARE WBC PRESENT, PREDOMINANTLY PMN     NO ORGANISMS SEEN   Report Status 10/28/2013 FINAL   Final  ANAEROBIC CULTURE     Status: None   Collection Time    10/28/13  2:26 PM      Result Value Ref Range Status   Specimen Description ABSCESS AORTA   Final  Special Requests PT ON VANCOMYCIN AND ZINACEF   Final   Gram Stain     Final   Value: RARE WBC PRESENT,BOTH PMN AND MONONUCLEAR     NO ORGANISMS SEEN     Performed at [facility]   Culture     Final   Value: NO ANAEROBES ISOLATED; CULTURE IN PROGRESS FOR 5 DAYS     Performed at [facility]   Report Status PENDING   Incomplete  CULTURE, ROUTINE-ABSCESS     Status: None   Collection Time    10/28/13  2:26 PM      Result Value Ref Range Status   Specimen Description ABSCESS AORTA   Final   Special Requests PT ON VANCOMYCIN AND ZINACEF   Final   Gram Stain     Final   Value: RARE WBC PRESENT, PREDOMINANTLY PMN     NO SQUAMOUS EPITHELIAL CELLS SEEN     NO ORGANISMS SEEN     Performed at [facility]     Performed at [facility]   Culture     Final   Value: NO GROWTH 3 DAYS     Performed at [facility]   Report Status 10/31/2013 FINAL   Final    Studies/Results: Dg Chest Port 1 View  11/01/2013   CLINICAL DATA:  Status post coronary bypass grafting  EXAM: PORTABLE CHEST - 1 VIEW  COMPARISON:  10/31/2013  FINDINGS: The endotracheal tube has been removed in the interval. Left-sided central venous lines are again noted and stable. Left retrocardiac consolidation is again seen. A small left-sided pleural effusion is noted as well. A right-sided dialysis catheter is noted and stable.  The right chest tube has been removed in the interval.  IMPRESSION: Slight increase in and left pleural effusion. Stable left lower lobe consolidation is noted.   Electronically Signed   By: Inez Catalina M.D.   On: 11/01/2013 08:17   Dg Chest Port 1 View  10/31/2013   CLINICAL DATA:  Status post cardiac surgery.  EXAM: PORTABLE CHEST - 1 VIEW  COMPARISON:  10/30/2013  FINDINGS: Endotracheal tube is 4.6 cm above the carina. Defibrillator pads overlying the left side of the chest. There are chest drains without a pneumothorax. There are two left jugular central lines. One catheter is coiled in the left upper chest and unchanged. The other jugular catheter is near the junction of the left subclavian vein and left innominate vein. Stable opacification at the left lung base probably represents consolidation and atelectasis. Dialysis catheter tip in the right atrium. Heart size is mildly enlarged.  IMPRESSION: Stable position of support apparatuses as described.  Negative for a pneumothorax.  Consolidation at the left lung base.   Electronically Signed   By: Markus Daft M.D.   On: 10/31/2013 08:48      Assessment/Plan: Bradley [name] is a 58 y.o. 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 WE would use for only 2 weeks and would use as low a dose as possible   With re to Naf vs Ancef we could  repeat MRI of the brain if we wanted to go back to ancef (since  CNS penetration is lousy with this) though at present with temp PM in not a good idea, can wait for later  I spent greater than 25 minutes with the patient including greater than 50% of time in face to face counsel of the patient and in coordination of their care.   #2 Nightmares, PTSD, Depression: recommend psychiatry consult, klonopin     LOS: 10 days   Alcide Evener 11/01/2013, 12:41 PM

## 2013-11-02 ENCOUNTER — Inpatient Hospital Stay (HOSPITAL_COMMUNITY): Payer: Medicaid Other

## 2013-11-02 LAB — ANAEROBIC CULTURE

## 2013-11-02 LAB — CBC
HCT: 28.5 % — ABNORMAL LOW (ref 39.0–52.0)
HCT: 26.8 % — ABNORMAL LOW (ref 39.0–52.0)
HCT: 28.5 % — ABNORMAL LOW (ref 39.0–52.0)
Hemoglobin: 9.5 g/dL — ABNORMAL LOW (ref 13.0–17.0)
Hemoglobin: 8.9 g/dL — ABNORMAL LOW (ref 13.0–17.0)
Hemoglobin: 9.7 g/dL — ABNORMAL LOW (ref 13.0–17.0)
MCH: 28.2 pg (ref 26.0–34.0)
MCH: 28 pg (ref 26.0–34.0)
MCH: 28.4 pg (ref 26.0–34.0)
MCHC: 33.3 g/dL (ref 30.0–36.0)
MCHC: 33.2 g/dL (ref 30.0–36.0)
MCHC: 34 g/dL (ref 30.0–36.0)
MCV: 84.6 fL (ref 78.0–100.0)
MCV: 84.3 fL (ref 78.0–100.0)
MCV: 83.3 fL (ref 78.0–100.0)
Platelets: 127 10*3/uL — ABNORMAL LOW (ref 150–400)
Platelets: 114 10*3/uL — ABNORMAL LOW (ref 150–400)
Platelets: 147 10*3/uL — ABNORMAL LOW (ref 150–400)
RBC: 3.37 MIL/uL — ABNORMAL LOW (ref 4.22–5.81)
RBC: 3.18 MIL/uL — ABNORMAL LOW (ref 4.22–5.81)
RBC: 3.42 MIL/uL — ABNORMAL LOW (ref 4.22–5.81)
RDW: 20.1 % — ABNORMAL HIGH (ref 11.5–15.5)
RDW: 19.9 % — ABNORMAL HIGH (ref 11.5–15.5)
RDW: 19.8 % — ABNORMAL HIGH (ref 11.5–15.5)
WBC: 16 10*3/uL — ABNORMAL HIGH (ref 4.0–10.5)
WBC: 14.7 10*3/uL — ABNORMAL HIGH (ref 4.0–10.5)
WBC: 14.7 10*3/uL — ABNORMAL HIGH (ref 4.0–10.5)

## 2013-11-02 LAB — GLUCOSE, CAPILLARY
Glucose-Capillary: 87 mg/dL (ref 70–99)
Glucose-Capillary: 90 mg/dL (ref 70–99)
Glucose-Capillary: 112 mg/dL — ABNORMAL HIGH (ref 70–99)
Glucose-Capillary: 121 mg/dL — ABNORMAL HIGH (ref 70–99)
Glucose-Capillary: 86 mg/dL (ref 70–99)

## 2013-11-02 LAB — CLOSTRIDIUM DIFFICILE BY PCR: Toxigenic C. Difficile by PCR: NEGATIVE

## 2013-11-02 LAB — COMPREHENSIVE METABOLIC PANEL
ALT: 45 U/L (ref 0–53)
AST: 78 U/L — ABNORMAL HIGH (ref 0–37)
Albumin: 1.7 g/dL — ABNORMAL LOW (ref 3.5–5.2)
Alkaline Phosphatase: 74 U/L (ref 39–117)
BUN: 29 mg/dL — ABNORMAL HIGH (ref 6–23)
CO2: 21 mEq/L (ref 19–32)
Calcium: 7.7 mg/dL — ABNORMAL LOW (ref 8.4–10.5)
Chloride: 95 mEq/L — ABNORMAL LOW (ref 96–112)
Creatinine, Ser: 4.98 mg/dL — ABNORMAL HIGH (ref 0.50–1.35)
GFR calc Af Amer: 14 mL/min — ABNORMAL LOW (ref >90)
GFR calc non Af Amer: 12 mL/min — ABNORMAL LOW (ref >90)
Glucose, Bld: 109 mg/dL — ABNORMAL HIGH (ref 70–99)
Potassium: 3 mEq/L — ABNORMAL LOW (ref 3.7–5.3)
Sodium: 137 mEq/L (ref 137–147)
Total Bilirubin: 0.9 mg/dL (ref 0.3–1.2)
Total Protein: 4.9 g/dL — ABNORMAL LOW (ref 6.0–8.3)

## 2013-11-02 LAB — OCCULT BLOOD X 1 CARD TO LAB, STOOL: Fecal Occult Bld: POSITIVE — AB

## 2013-11-02 MED ORDER — POTASSIUM CHLORIDE CRYS ER 20 MEQ PO TBCR
20.0000 meq | EXTENDED_RELEASE_TABLET | Freq: Two times a day (BID) | ORAL | Status: AC
  Administered 2013-11-02 (×2): 20 meq via ORAL
  Filled 2013-11-02 (×2): qty 1

## 2013-11-02 MED ORDER — CLONAZEPAM 0.5 MG PO TABS
0.5000 mg | ORAL_TABLET | Freq: Two times a day (BID) | ORAL | Status: DC
  Administered 2013-11-02 – 2013-11-13 (×22): 0.5 mg via ORAL
  Filled 2013-11-02 (×22): qty 1

## 2013-11-02 MED ORDER — FE FUMARATE-B12-VIT C-FA-IFC PO CAPS
1.0000 | ORAL_CAPSULE | Freq: Every morning | ORAL | Status: DC
  Administered 2013-11-03 – 2013-11-13 (×11): 1 via ORAL
  Filled 2013-11-02 (×11): qty 1

## 2013-11-02 MED ORDER — DOPAMINE-DEXTROSE 3.2-5 MG/ML-% IV SOLN
0.0000 ug/kg/min | INTRAVENOUS | Status: DC
  Administered 2013-11-03: 1 ug/kg/min via INTRAVENOUS
  Filled 2013-11-02: qty 250

## 2013-11-02 MED ORDER — INSULIN ASPART 100 UNIT/ML ~~LOC~~ SOLN
0.0000 [IU] | Freq: Three times a day (TID) | SUBCUTANEOUS | Status: DC
  Administered 2013-11-02 – 2013-11-04 (×3): 2 [IU] via SUBCUTANEOUS

## 2013-11-02 MED ORDER — FENTANYL CITRATE 0.05 MG/ML IJ SOLN: 12.5000 ug | INTRAMUSCULAR | Status: DC | PRN

## 2013-11-02 MED ORDER — ASPIRIN EC 81 MG PO TBEC
81.0000 mg | DELAYED_RELEASE_TABLET | Freq: Every day | ORAL | Status: DC
  Administered 2013-11-03 – 2013-11-13 (×11): 81 mg via ORAL
  Filled 2013-11-02 (×11): qty 1

## 2013-11-02 MED ORDER — RIFAMPIN 300 MG PO CAPS
300.0000 mg | ORAL_CAPSULE | Freq: Three times a day (TID) | ORAL | Status: DC
  Administered 2013-11-02 – 2013-11-10 (×21): 300 mg via ORAL
  Filled 2013-11-02 (×26): qty 1

## 2013-11-02 NOTE — Progress Notes (Signed)
5 Days Post-Op Procedure(s) (LRB): REDO STERNOTOMY (N/A) REDO BENTALL PROCEDURE, debridment of aoritc root abscess, replacement of aortic root, ascending aorta and aortic valve with homograft. Insertion of left femoral arterial line (N/A) INTRAOPERATIVE TRANSESOPHAGEAL ECHOCARDIOGRAM (N/A) Subjective: Emotional status better with small doe of klonopin( home med), slept better OOB to chair- PT rec CIR NSR, slowly weaning dopamine Cont iv antibiotics per ID Objective: Vital signs in last 24 hours: Temp:  [97.6 F (36.4 C)-99.1 F (37.3 C)] 98 F (36.7 C) (04/05 0723) Pulse Rate:  [30-108] 108 (04/05 0900) Cardiac Rhythm:  [-] Normal sinus rhythm (04/05 1000) Resp:  [0-42] 25 (04/05 1000) BP: (94-151)/(52-96) 116/52 mmHg (04/05 1000) SpO2:  [91 %-100 %] 95 % (04/05 1000) Weight:  [207 lb 3.7 oz (94 kg)] 207 lb 3.7 oz (94 kg) (04/05 0358)  Hemodynamic parameters for last 24 hours: CVP:  [13 mmHg] 13 mmHg  Intake/Output from previous day: 04/04 0701 - 04/05 0700 In: 1251.1 [I.V.:651.1; IV Piggyback:600] Out: 124 [Urine:110; Stool:4; Chest Tube:10] Intake/Output this shift: Total I/O In: 359.2 [P.O.:240; I.V.:69.2; IV Piggyback:50] Out: 2 [Urine:1; Stool:1]  Incision clean  Lab Results:  Recent Labs  11/02/13 0400 11/02/13 0440  WBC 16.0* 14.7*  HGB 9.5* 8.9*  HCT 28.5* 26.8*  PLT 127* 114*   BMET:  Recent Labs  11/01/13 0410 11/02/13 0440  NA 137 137  K 3.6* 3.0*  CL 94* 95*  CO2 22 21  GLUCOSE 107* 109*  BUN 24* 29*  CREATININE 3.93* 4.98*  CALCIUM 7.9* 7.7*    PT/INR: No results found for this basename: LABPROT, INR,  in the last 72 hours ABG    Component Value Date/Time   PHART 7.515* 10/31/2013 1342   HCO3 25.6* 10/31/2013 1342   TCO2 27 10/31/2013 1342   ACIDBASEDEF 1.0 10/29/2013 1536   O2SAT 99.0 10/31/2013 1342   CBG (last 3)   Recent Labs  11/01/13 2031 11/02/13 0352 11/02/13 0720  GLUCAP 94 87 90    Assessment/Plan: S/P Procedure(s)  (LRB): REDO STERNOTOMY (N/A) REDO BENTALL PROCEDURE, debridment of aoritc root abscess, replacement of aortic root, ascending aorta and aortic valve with homograft. Insertion of left femoral arterial line (N/A) INTRAOPERATIVE TRANSESOPHAGEAL ECHOCARDIOGRAM (N/A) Slowly wean dopamine Remove sleeve, keep 2-port   LOS: 11 days    VAN TRIGT III,Shaira Sova 11/02/2013

## 2013-11-02 NOTE — Progress Notes (Signed)
Patient ID: Bradley [name], male   DOB: 05/15/1956, 58 y.o.   MRN: [id number]  [facility] Progress Note   Assessment/ Plan:   1 Paravalvular abscess/Prosthetic valve endocarditis: on nafcillin/RFM/Gent, 5th day post debridement of aortic root abscess, replacement of aortic root, ascending aorta and aortic valve with homograft. Mobilization OOB planned.  2 ESRD on hemodialysis- Last HD done Thursday (skipped yesterday due to patient request and permissible labs/exam) Plan for HD tomorrow. He has volume excess but remains on pressors that limit UF.  3 Recent MSSA bacteremia w septic cerebral emboli / endocarditis: now s/p debridement of aortic root abscess and root replacement with homograft. On ABx.  4 Anemia Hb 10 on darbe 100/wk - s/p recent PRBCs and will recheck iron stores 5 HTN/volume- weaning dopamine, mildly hypervolemic (interstitial) on exam 6.Hypokalemia: Suspected GI losses (not diarrhea but frequent stools) replete via PO route and adjust dialysate tomorrow.  Subjective:   Reports to be feeling well and motivated to ambulate and participate in therapy today. Seen by psychiatry yesterday and started on anxiolytic therapy.   Objective:   BP 106/65  Pulse 30  Temp(Src) 98 F (36.7 C) (Oral)  Resp 24  Ht 6' 1.5" (1.867 m)  Wt 94 kg (207 lb 3.7 oz)  BMI 26.97 kg/m2  SpO2 100%  Physical Exam: [id number] resting in bed- being moved into recliner [id number] RRR, normal S1 with muffled S2 Resp:Decreased BS bibasally, no distinct rales/rhonchi [id number], obese, NT, BS normal [id number] LE edema, 1+ UE edema  Labs: BMET  Recent Labs Lab 10/27/13 1833  10/29/13 0400 10/29/13 1508 10/29/13 1514 10/29/13 1600 10/29/13 1601 10/30/13 0400 10/31/13 0410 11/01/13 0410 11/02/13 0440  NA 136*  < > 135* 137 134*  --  135* 137 138 137 137  K 4.4  < > 5.6* 6.3* 5.7*  --  4.4 5.6* 4.0 3.6* 3.0*  CL 96  --  101 97 102  --  102 97 96 94* 95*  CO2 27  --  20 21  --    --   --  18* 24 22 21   GLUCOSE 100*  < > 114* 159* 156*  --  150* 140* 134* 107* 109*  BUN 12  --  12 15 17   --  14 20 16  24* 29*  CREATININE 3.16*  --  2.37* 2.92* 3.20* 2.97* 3.40* 3.62* 2.92* 3.93* 4.98*  CALCIUM 8.5  --  9.3 9.3  --   --   --  9.0 8.1* 7.9* 7.7*  PHOS  --   --   --  5.5*  --   --   --   --   --   --   --   < > = values in this interval not displayed. CBC  Recent Labs Lab 10/31/13 0410 11/01/13 0410 11/02/13 0400 11/02/13 0440  WBC 15.1* 14.0* 16.0* 14.7*  HGB 9.1* 9.4* 9.5* 8.9*  HCT 27.1* 28.0* 28.5* 26.8*  MCV 84.7 84.1 84.6 84.3  PLT 89* 120* 127* 114*   Medications:    . acetaminophen  1,000 mg Oral 4 times per day  . amiodarone  200 mg Oral Daily  . aspirin EC  325 mg Oral Daily   Or  . aspirin  324 mg Per Tube Daily  . bisacodyl  10 mg Oral Daily   Or  . bisacodyl  10 mg Rectal Daily  . chlorhexidine  15 mL Mouth Rinse BID  . clonazePAM  1 mg Oral BID  .  docusate sodium  200 mg Oral Daily  . enoxaparin (LOVENOX) injection  30 mg Subcutaneous Q24H  . [START ON 11/03/2013] gentamicin  100 mg Intravenous Q M,W,F-HD  . insulin aspart  0-24 Units Subcutaneous 6 times per day  . levothyroxine  200 mcg Oral QAC breakfast  . metoprolol tartrate  12.5 mg Oral BID   Or  . metoprolol tartrate  12.5 mg Per Tube BID  . mirtazapine  15 mg Oral QHS  . nafcillin IV  2 g Intravenous 6 times per day  . pantoprazole  40 mg Oral Daily  . rifampin (RIFADIN) IVPB  300 mg Intravenous 3 times per day  . sertraline  50 mg Oral Daily  . sodium chloride  3 mL Intravenous Q12H   Elmarie Shiley, MD 11/02/2013, 8:16 AM

## 2013-11-02 NOTE — Progress Notes (Signed)
CT Surgery PM Rounds  Continues to feel better on psych meds NSR- weaning dopamine walked in hallway this pm L arm swollen but w/o erythema, tenderness

## 2013-11-02 NOTE — Progress Notes (Signed)
[facility]     Day # 5 nafcillin Day # 5 rifampin Day #3 gentamicin   Subjective: C/o feeling cold and fatigued  Antibiotics:  Anti-infectives   Start     Dose/Rate Route Frequency Ordered Stop   11/03/13 1200  gentamicin (GARAMYCIN) IVPB 100 mg     100 mg 200 mL/hr over 30 Minutes Intravenous Every M-W-F (Hemodialysis) 11/01/13 1429 11/13/13 2359   11/01/13 1200  gentamicin (GARAMYCIN) IVPB 100 mg  Status:  Discontinued     100 mg 200 mL/hr over 30 Minutes Intravenous Every T-Th-Sa (Hemodialysis) 10/30/13 1037 11/01/13 1429   10/31/13 1200  gentamicin (GARAMYCIN) IVPB 80 mg  Status:  Discontinued     80 mg 100 mL/hr over 30 Minutes Intravenous Every Dialysis 10/30/13 0047 10/30/13 1027   10/30/13 1300  gentamicin (GARAMYCIN) 120 mg in dextrose 5 % 50 mL IVPB     120 mg 106 mL/hr over 30 Minutes Intravenous  Once 10/30/13 1037 10/30/13 1615   10/30/13 0500  gentamicin (GARAMYCIN) IVPB 80 mg     80 mg 100 mL/hr over 30 Minutes Intravenous  Once 10/30/13 0047 10/30/13 0526   10/29/13 1600  levofloxacin (LEVAQUIN) IVPB 500 mg  Status:  Discontinued     500 mg 100 mL/hr over 60 Minutes Intravenous Every 48 hours 10/27/13 1457 10/29/13 0857   10/29/13 1000  rifampin (RIFADIN) 300 mg in sodium chloride 0.9 % 100 mL IVPB     300 mg 200 mL/hr over 30 Minutes Intravenous 3 times per day 10/29/13 0858     10/29/13 1000  nafcillin 2 g in dextrose 5 % 50 mL IVPB     2 g 100 mL/hr over 30 Minutes Intravenous 6 times per day 10/29/13 0901     10/29/13 0900  nafcillin injection 2 g  Status:  Discontinued     2 g Intravenous Every 4 hours 10/29/13 0857 10/29/13 0900   10/29/13 0500  vancomycin (VANCOCIN) IVPB 1000 mg/200 mL premix     1,000 mg 200 mL/hr over 60 Minutes Intravenous  Once 10/28/13 2146 10/29/13 0600   10/29/13 0100  cefUROXime (ZINACEF) 1.5 g in dextrose 5 % 50 mL IVPB  Status:  Discontinued     1.5 g 100 mL/hr over 30 Minutes  Intravenous Every 12 hours 10/28/13 2146 10/29/13 0857   10/28/13 0400  vancomycin (VANCOCIN) 1,250 mg in sodium chloride 0.9 % 250 mL IVPB     1,250 mg 166.7 mL/hr over 90 Minutes Intravenous To Surgery 10/27/13 1450 10/28/13 0905   10/28/13 0400  cefUROXime (ZINACEF) 1.5 g in dextrose 5 % 50 mL IVPB     1.5 g 100 mL/hr over 30 Minutes Intravenous To Surgery 10/27/13 1450 10/28/13 2030   10/28/13 0400  cefUROXime (ZINACEF) 750 mg in dextrose 5 % 50 mL IVPB  Status:  Discontinued     750 mg 100 mL/hr over 30 Minutes Intravenous To Surgery 10/27/13 1447 10/28/13 2114   10/27/13 1600  levofloxacin (LEVAQUIN) IVPB 750 mg     750 mg 100 mL/hr over 90 Minutes Intravenous  Once 10/27/13 1456 10/28/13 0231   10/23/13 0800  ceFAZolin (ANCEF) IVPB 1 g/50 mL premix  Status:  Discontinued     1 g 100 mL/hr over 30 Minutes Intravenous  Once 10/22/13 2313 10/22/13 2326   10/22/13 2359  ceFAZolin (ANCEF) IVPB 2 g/50 mL premix  Status:  Discontinued     2 g 100 mL/hr over  30 Minutes Intravenous Every M-W-F (Hemodialysis) 10/22/13 2314 10/28/13 2146   10/22/13 2315  ceFAZolin (ANCEF) IVPB 2 g/50 mL premix  Status:  Discontinued     2 g 100 mL/hr over 30 Minutes Intravenous  Once 10/22/13 2313 10/22/13 2326      Medications: Scheduled Meds: . acetaminophen  1,000 mg Oral 4 times per day  . amiodarone  200 mg Oral Daily  . [START ON 11/03/2013] aspirin EC  81 mg Oral Daily  . bisacodyl  10 mg Oral Daily   Or  . bisacodyl  10 mg Rectal Daily  . chlorhexidine  15 mL Mouth Rinse BID  . clonazePAM  0.5 mg Oral BID  . docusate sodium  200 mg Oral Daily  . [START ON 11/03/2013] gentamicin  100 mg Intravenous Q M,W,F-HD  . insulin aspart  0-24 Units Subcutaneous TID AC & HS  . levothyroxine  200 mcg Oral QAC breakfast  . metoprolol tartrate  12.5 mg Oral BID   Or  . metoprolol tartrate  12.5 mg Per Tube BID  . mirtazapine  15 mg Oral QHS  . nafcillin IV  2 g Intravenous 6 times per day  . pantoprazole   40 mg Oral Daily  . potassium chloride  20 mEq Oral BID  . rifampin (RIFADIN) IVPB  300 mg Intravenous 3 times per day  . sertraline  50 mg Oral Daily  . sodium chloride  3 mL Intravenous Q12H   Continuous Infusions: . sodium chloride Stopped (10/30/13 0700)  . sodium chloride 20 mL/hr at 11/02/13 1130  . DOPamine 2 mcg/kg/min (11/02/13 0900)  . lactated ringers Stopped (10/29/13 1900)   PRN Meds:.fentaNYL, metoprolol, ondansetron (ZOFRAN) IV, sodium chloride   Objective: Weight change: -9 lb 14.7 oz (-4.5 kg)  Intake/Output Summary (Last 24 hours) at 11/02/13 1308 Last data filed at 11/02/13 1300  Gross per 24 hour  Intake 1424.4 ml  Output      4 ml  Net 1420.4 ml   Blood pressure 143/64, pulse 87, temperature 97.5 F (36.4 C), temperature source Oral, resp. rate 12, height 6' 1.5" (1.867 m), weight 207 lb 3.7 oz (94 kg), SpO2 96.00%. Temp:  [97.5 F (36.4 C)-98.4 F (36.9 C)] 97.5 F (36.4 C) (04/05 1149) Pulse Rate:  [30-108] 87 (04/05 1200) Resp:  [0-42] 12 (04/05 1200) BP: (94-146)/(52-96) 143/64 mmHg (04/05 1200) SpO2:  [91 %-100 %] 96 % (04/05 1200) Weight:  [207 lb 3.7 oz (94 kg)] 207 lb 3.7 oz (94 kg) (04/05 0358)  Physical Exam: General: awake alert, extubated and ao  X 3 HEENT: anicteric sclera, , EOMI CVS tachy rate, normal r,   Chest: fairly clear to auscultation bilaterally, Abdomen: soft nontender, nondistended, normal bowel sounds, Extremities: no  clubbing or edema noted bilaterally Skin: sternal wound clean  Neuro: nonfocal  Lab Results:  Recent Labs  11/02/13 0400 11/02/13 0440  WBC 16.0* 14.7*  HGB 9.5* 8.9*  HCT 28.5* 26.8*  PLT 127* 114*    BMET  Recent Labs  11/01/13 0410 11/02/13 0440  NA 137 137  K 3.6* 3.0*  CL 94* 95*  CO2 22 21  GLUCOSE 107* 109*  BUN 24* 29*  CREATININE 3.93* 4.98*  CALCIUM 7.9* 7.7*    Micro Results: Recent Results (from the past 240 hour(s))  SURGICAL PCR SCREEN     Status: None    Collection Time    10/27/13  1:13 PM      Result Value Ref Range Status  MRSA, PCR NEGATIVE  NEGATIVE Final   Staphylococcus aureus NEGATIVE  NEGATIVE Final   Comment:            The Xpert SA Assay (FDA     approved for NASAL specimens     in patients over 73 years of age),     is one component of     a comprehensive surveillance     program.  Test performance has     been validated by [employer] for patients greater     than or equal to 90 year old.     It is not intended     to diagnose infection nor to     guide or monitor treatment.  TISSUE CULTURE     Status: None   Collection Time    10/28/13  1:20 PM      Result Value Ref Range Status   Specimen Description TISSUE AORTA   Final   Special Requests PT ON VANCOMYCIN AND ZINACEF   Final   Gram Stain     Final   Value: RARE WBC PRESENT,BOTH PMN AND MONONUCLEAR     NO ORGANISMS SEEN     Performed at [facility]   Culture     Final   Value: NO GROWTH 3 DAYS     Performed at [facility]   Report Status 10/31/2013 FINAL   Final  GRAM STAIN     Status: None   Collection Time    10/28/13  2:26 PM      Result Value Ref Range Status   Specimen Description ABSCESS AORTA   Final   Special Requests PT ON VANCOMYCIN AND ZINACEF   Final   Gram Stain     Final   Value: RARE WBC PRESENT, PREDOMINANTLY PMN     NO ORGANISMS SEEN   Report Status 10/28/2013 FINAL   Final  ANAEROBIC CULTURE     Status: None   Collection Time    10/28/13  2:26 PM      Result Value Ref Range Status   Specimen Description ABSCESS AORTA   Final   Special Requests PT ON VANCOMYCIN AND ZINACEF   Final   Gram Stain     Final   Value: RARE WBC PRESENT,BOTH PMN AND MONONUCLEAR     NO ORGANISMS SEEN     Performed at [facility]   Culture     Final   Value: NO ANAEROBES ISOLATED     Performed at [facility]   Report Status 11/02/2013 FINAL   Final  CULTURE, ROUTINE-ABSCESS     Status: None   Collection Time     10/28/13  2:26 PM      Result Value Ref Range Status   Specimen Description ABSCESS AORTA   Final   Special Requests PT ON VANCOMYCIN AND ZINACEF   Final   Gram Stain     Final   Value: RARE WBC PRESENT, PREDOMINANTLY PMN     NO SQUAMOUS EPITHELIAL CELLS SEEN     NO ORGANISMS SEEN     Performed at [facility]     Performed at [facility]   Culture     Final   Value: NO GROWTH 3 DAYS     Performed at [facility]   Report Status 10/31/2013 FINAL   Final  CLOSTRIDIUM DIFFICILE BY PCR     Status: None   Collection Time  11/02/13 10:04 AM      Result Value Ref Range Status   C difficile by pcr NEGATIVE  NEGATIVE Final    Studies/Results: Dg Chest Port 1 View  11/02/2013   CLINICAL DATA:  Aortic valve replacement  EXAM: PORTABLE CHEST - 1 VIEW  COMPARISON:  11/01/2013  FINDINGS: Left-sided central lines and right IJ dialysis catheters are all stable in position. Previous median sternotomy noted. Stable cardiomegaly with vascular congestion. Persistent left lower lobe collapse/consolidation with a left effusion. No pneumothorax. No significant interval change. Interval removal of the mediastinal drain.  IMPRESSION: Stable cardiomegaly with vascular congestion  Persistent left lower lobe collapse/consolidation with a left effusion  Support apparatus unchanged   Electronically Signed   By: Daryll Brod M.D.   On: 11/02/2013 09:01   Dg Chest Port 1 View  11/01/2013   CLINICAL DATA:  Status post coronary bypass grafting  EXAM: PORTABLE CHEST - 1 VIEW  COMPARISON:  10/31/2013  FINDINGS: The endotracheal tube has been removed in the interval. Left-sided central venous lines are again noted and stable. Left retrocardiac consolidation is again seen. A small left-sided pleural effusion is noted as well. A right-sided dialysis catheter is noted and stable. The right chest tube has been removed in the interval.  IMPRESSION: Slight increase in and left pleural effusion. Stable left  lower lobe consolidation is noted.   Electronically Signed   By: Inez Catalina M.D.   On: 11/01/2013 08:17      Assessment/Plan: Bradley [name] is a 58 y.o. 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 WE would use for only 2 weeks and would use as low a dose as possible   With re to Naf vs Ancef we could  repeat MRI of the brain if we wanted to go back to ancef (since CNS penetration is lousy with this) though at present with temp PM in not a good idea, can wait for later  I will also see if pharmacy can come up with continuous dose of Nafcillin for dosing convenience  I spent greater than 25 minutes with the patient including greater than 50% of time in face to face counsel of the patient and in coordination of their care.        LOS: 11 days   Alcide Evener 11/02/2013, 1:08 PM

## 2013-11-03 ENCOUNTER — Inpatient Hospital Stay (HOSPITAL_COMMUNITY): Payer: Medicaid Other

## 2013-11-03 DIAGNOSIS — F339 Major depressive disorder, recurrent, unspecified: Secondary | ICD-10-CM

## 2013-11-03 DIAGNOSIS — Y849 Medical procedure, unspecified as the cause of abnormal reaction of the patient, or of later complication, without mention of misadventure at the time of the procedure: Secondary | ICD-10-CM

## 2013-11-03 DIAGNOSIS — F4322 Adjustment disorder with anxiety: Secondary | ICD-10-CM

## 2013-11-03 LAB — COMPREHENSIVE METABOLIC PANEL
ALT: 34 U/L (ref 0–53)
AST: 60 U/L — ABNORMAL HIGH (ref 0–37)
Albumin: 1.7 g/dL — ABNORMAL LOW (ref 3.5–5.2)
Alkaline Phosphatase: 77 U/L (ref 39–117)
BUN: 35 mg/dL — ABNORMAL HIGH (ref 6–23)
CO2: 19 mEq/L (ref 19–32)
Calcium: 7.8 mg/dL — ABNORMAL LOW (ref 8.4–10.5)
Chloride: 97 mEq/L (ref 96–112)
Creatinine, Ser: 5.77 mg/dL — ABNORMAL HIGH (ref 0.50–1.35)
GFR calc Af Amer: 11 mL/min — ABNORMAL LOW (ref >90)
GFR calc non Af Amer: 10 mL/min — ABNORMAL LOW (ref >90)
Glucose, Bld: 87 mg/dL (ref 70–99)
Potassium: 4 mEq/L (ref 3.7–5.3)
Sodium: 139 mEq/L (ref 137–147)
Total Bilirubin: 0.8 mg/dL (ref 0.3–1.2)
Total Protein: 5.2 g/dL — ABNORMAL LOW (ref 6.0–8.3)

## 2013-11-03 LAB — GLUCOSE, CAPILLARY
Glucose-Capillary: 92 mg/dL (ref 70–99)
Glucose-Capillary: 122 mg/dL — ABNORMAL HIGH (ref 70–99)
Glucose-Capillary: 87 mg/dL (ref 70–99)
Glucose-Capillary: 112 mg/dL — ABNORMAL HIGH (ref 70–99)

## 2013-11-03 LAB — CBC
HCT: 26.5 % — ABNORMAL LOW (ref 39.0–52.0)
Hemoglobin: 8.9 g/dL — ABNORMAL LOW (ref 13.0–17.0)
MCH: 28 pg (ref 26.0–34.0)
MCHC: 33.6 g/dL (ref 30.0–36.0)
MCV: 83.3 fL (ref 78.0–100.0)
Platelets: 155 10*3/uL (ref 150–400)
RBC: 3.18 MIL/uL — ABNORMAL LOW (ref 4.22–5.81)
RDW: 20 % — ABNORMAL HIGH (ref 11.5–15.5)
WBC: 11.9 10*3/uL — ABNORMAL HIGH (ref 4.0–10.5)

## 2013-11-03 MED ORDER — DARBEPOETIN ALFA-POLYSORBATE 100 MCG/0.5ML IJ SOLN
100.0000 ug | INTRAMUSCULAR | Status: DC
  Administered 2013-11-04: 100 ug via INTRAVENOUS
  Filled 2013-11-03 (×3): qty 0.5

## 2013-11-03 MED ORDER — DARBEPOETIN ALFA-POLYSORBATE 100 MCG/0.5ML IJ SOLN: 100.0000 ug | INTRAMUSCULAR | Status: DC

## 2013-11-03 NOTE — Progress Notes (Signed)
Subjective: stable no complaints  Filed Vitals:   11/03/13 1145 11/03/13 1200 11/03/13 1230 11/03/13 1300  BP: 93/64 95/72 94/69  97/69  Pulse: 87     Temp: 97.7 F (36.5 C)     TempSrc: Oral     Resp: 23 21    Height:      Weight:      SpO2: 100% 99%     Exam: Lying in bed, not in distress +JVD Chest clear on R, dec'd L base RRR no MRG Abd soft, obese NTND Bilat 1+ LE edema Neuro is nf, Ox3 LFA AVF patent, R IJ Diatek   Dialysis: TTS [facility] 4h   86kg   3K/2.25 [place]   R IJ Cath / maturing LFA AVF (placed 3/11)  Heparin 8600  Hectorol none     Epo none Recent lab: Hb 10.1 ferritin 836  pth 175   Assessment: 1 Prosthetic valve IE s/p redo Bentall procedure w I&D abcess- on IV gent/nafcillin and po rifampin 2 ESRD, recent AKI injury from meds / infxn last admit, never biopsied and no signs of recovery yet; 1st HD 09/17/12 3 Vol excess (postop) / pleural effusion- max UF w HD today, will need daily HD for a while to get vol down 4 Hypotension- on dopamine 5 L arm AV fistula- patent, placed on 3/11 6 Anemia resume darbe 100/wk, Hb 8.9, check tsat 7 MBD- this is AKI so this is not an issue yet    Plan- HD today and again tomorrow, get standing wt when OOB today, get vol down    Kelly Splinter MD  pager [phone]    cell [phone]  11/03/2013, 1:08 PM     Recent Labs Lab 10/29/13 1508  11/01/13 0410 11/02/13 0440 11/03/13 0450  NA 137  < > 137 137 139  K 6.3*  < > 3.6* 3.0* 4.0  CL 97  < > 94* 95* 97  CO2 21  < > 22 21 19   GLUCOSE 159*  < > 107* 109* 87  BUN 15  < > 24* 29* 35*  CREATININE 2.92*  < > 3.93* 4.98* 5.77*  CALCIUM 9.3  < > 7.9* 7.7* 7.8*  PHOS 5.5*  --   --   --   --   < > = values in this interval not displayed.  Recent Labs Lab 11/01/13 0410 11/02/13 0440 11/03/13 0450  AST 137* 78* 60*  ALT 70* 45 34  ALKPHOS 72 74 77  BILITOT 1.0 0.9 0.8  PROT 5.2* 4.9* 5.2*  ALBUMIN 1.9* 1.7* 1.7*    Recent Labs Lab 11/02/13 0440  11/02/13 1830 11/03/13 0450  WBC 14.7* 14.7* 11.9*  HGB 8.9* 9.7* 8.9*  HCT 26.8* 28.5* 26.5*  MCV 84.3 83.3 83.3  PLT 114* 147* 155   . acetaminophen  1,000 mg Oral 4 times per day  . amiodarone  200 mg Oral Daily  . aspirin EC  81 mg Oral Daily  . bisacodyl  10 mg Oral Daily   Or  . bisacodyl  10 mg Rectal Daily  . chlorhexidine  15 mL Mouth Rinse BID  . clonazePAM  0.5 mg Oral BID  . docusate sodium  200 mg Oral Daily  . ferrous [id number] C-folic acid  1 capsule Oral q morning - 10a  . gentamicin  100 mg Intravenous Q M,W,F-HD  . insulin aspart  0-24 Units Subcutaneous TID AC & HS  . levothyroxine  200 mcg Oral QAC breakfast  . metoprolol tartrate  12.5 mg Oral BID   Or  . metoprolol tartrate  12.5 mg Per Tube BID  . mirtazapine  15 mg Oral QHS  . nafcillin IV  2 g Intravenous 6 times per day  . pantoprazole  40 mg Oral Daily  . rifampin  300 mg Oral 3 times per day  . sertraline  50 mg Oral Daily  . sodium chloride  3 mL Intravenous Q12H   . sodium chloride Stopped (10/30/13 0700)  . sodium chloride 1 mL (11/03/13 0733)  . DOPamine 1 mcg/kg/min (11/03/13 1100)  . lactated ringers Stopped (10/29/13 1900)   fentaNYL, metoprolol, ondansetron (ZOFRAN) IV, sodium chloride

## 2013-11-03 NOTE — Progress Notes (Signed)
Erin Barrett PA updated CXR report per radiology in regards to central line placement (left IJ). PA reviewed xray and previous reports. PA updated dopamine infusing. Will also updated MD with rounds. Will continue to monitor. Bradley [name]

## 2013-11-03 NOTE — Consult Note (Signed)
Reason for Consult: Depression, status post cardiac surgical Referring Physician: Jonetta Osgood, MD   Bradley [name] is an 58 y.o. male.  HPI: Patient was seen and chart reviewed. Patient has been suffering with increased symptoms of depression, disturbance of sleep, nightmares and extreme anxiety about major cardiac procedure scheduled for tomorrow. Patient stated he cannot relax during nighttime and daytime he has been disturbed by several people/staff members throughout the day. Patient feels he has been physically and mentally exhausted and has a negative thinking about the procedure. Patient stated he has been feeling he has been a burden to his family and has a passive thoughts about not waking up next day but denies active suicidal ideation, intentions or plans. Patient is supported by his family and he reported he has a trust with the doctor who is performing the procedure. Patient also reported he had 2 previous cardiac procedures which went well and recovered without difficulties. Patient has a 4 children ages from 29-31. Patient has been compliant with his medications and has no reported adverse effects.  Interval History: Patient was seen today for psychiatric consultation followup. Patient has been slowly showing clinical improvement both mentally and emotionally. Patient reported he was able to walk with limited support from the staff members and family. Patient stuff RN, wife and the son were at bedside during this evaluation. Patient stated he has been less emotional and having a better sleep since his medication was restarted about 2 days ago. Patient has been feeling  better and resting well at night without nightmares. He has been less emotional  and not tearing this visit.   Mental Status Examination: Patient appeared as per his stated age,  decrease in psychomotor activity, with depression mood and anxious affect. He has normal rate, rhythm, and low volume of speech. His thought  process is linear and goal directed. Patient has denied suicidal, homicidal ideations, intentions or plans. Patient has no evidence of auditory or visual hallucinations, delusions, and paranoia. Patient has fair insight judgment and impulse control.  Past Medical History  Diagnosis Date  . Anxiety   . Depression   . Hypertension   . Hypothyroidism, postsurgical   . Aortic stenosis     s/p Bentall with bioprosthetic AVR 02/2010; Last echo (9/11): Moderate LVH, EF 45-50%, AVR functioning appropriately, aortic valve mean gradient 21, diastolic dysfunction. Chest MRA (2/13): Mild to moderate dilatation at the sinus of Valsalva at 4.1 cm, mild dilatation ascending aorta distal to the tube graft at 3.9 cm, moderate dilatation of the innominate artery a 2.1 cm;    . Hx of cardiac cath     a. LHC in 02/2010: normal cors  . Hx of echocardiogram 2014    Echo (10/14): Severe LVH, EF 60-65%, normal wall motion, grade 1 diastolic dysfunction, AVR functioning normally, mild aortic stenosis (mean 19), moderately dilated aorta, mild LAE, mild RVE  . Staphylococcus aureus bacteremia   . Prosthetic valve endocarditis   . Thyroid cancer   . Heart murmur   . ESRD on dialysis     "[facility]; TTS" (10/22/2013)    Past Surgical History  Procedure Laterality Date  . Cardiac catheterization  03/02/2010    NORMAL CORONARY ARTERY  . Sternotomy      REDO  . Transthoracic echocardiogram  03/2010    SHOWED MILD REDUCTION OF LV FUNCTION  . Thyroidectomy  ~ 2005  . Shoulder arthroscopy w/ rotator cuff repair Right 2012  . Av fistula placement Left 10/08/2013  Procedure: ARTERIOVENOUS (AV) FISTULA CREATION- LEFT ARM; Radial Cephalic ;  Surgeon: Mal [name], MD;  Location: [place];  Service: Vascular;  Laterality: Left;  . Aortic valve replacement  2011  . Cardiac valve replacement    . Aortic valve repair  1968  . Tee without cardioversion N/A 10/24/2013    Procedure: TRANSESOPHAGEAL ECHOCARDIOGRAM (TEE);   Surgeon: Dorothy Spark, MD;  Location: [facility] ENDOSCOPY;  Service: Cardiovascular;  Laterality: N/A;  Deneen Harts procedure N/A 10/28/2013    Procedure: REDO BENTALL PROCEDURE, debridment of aoritc root abscess, replacement of aortic root, ascending aorta and aortic valve with homograft. Insertion of left femoral arterial line;  Surgeon: [name], MD;  Location: [place];  Service: Open Heart Surgery;  Laterality: N/A;  . Intraoperative transesophageal echocardiogram N/A 10/28/2013    Procedure: INTRAOPERATIVE TRANSESOPHAGEAL ECHOCARDIOGRAM;  Surgeon: [name], MD;  Location: [place];  Service: Open Heart Surgery;  Laterality: N/A;    Family History  Problem Relation Age of Onset  . Heart disease Father     Social History:  reports that he has never smoked. He has never used smokeless tobacco. He reports that he does not drink alcohol or use illicit drugs.  Allergies:  Allergies  Allergen Reactions  . Oxycodone     Gives patient nightmares  . Rifampin Nausea Only    Medications: I have reviewed the patient's current medications.  Results for orders placed during the hospital encounter of 10/22/13 (from the past 48 hour(s))  GLUCOSE, CAPILLARY     Status: None   Collection Time    11/01/13  8:31 PM      Result Value Ref Range   Glucose-Capillary 94  70 - 99 mg/dL   Comment 1 Notify RN     Comment 2 Documented in Chart    GLUCOSE, CAPILLARY     Status: None   Collection Time    11/02/13  3:52 AM      Result Value Ref Range   Glucose-Capillary 87  70 - 99 mg/dL  CBC     Status: Abnormal   Collection Time    11/02/13  4:00 AM      Result Value Ref Range   WBC 16.0 (*) 4.0 - 10.5 K/uL   Comment: QUESTIONABLE RESULTS, RECOMMEND RECOLLECT TO VERIFY     PER A. DANIEL RN [id number] GREEN R   RBC 3.37 (*) 4.22 - 5.81 MIL/uL   Comment: QUESTIONABLE RESULTS, RECOMMEND RECOLLECT TO VERIFY     PER A. DANIEL RN [id number] 0529 GREEN R   Hemoglobin 9.5 (*) 13.0 - 17.0 g/dL    Comment: QUESTIONABLE RESULTS, RECOMMEND RECOLLECT TO VERIFY     PER A. DANIEL RN [id number] GREEN R   HCT 28.5 (*) 39.0 - 52.0 %   Comment: QUESTIONABLE RESULTS, RECOMMEND RECOLLECT TO VERIFY     PER A. DANIEL RN [id number] 0529 GREEN R   MCV 84.6  78.0 - 100.0 fL   Comment: QUESTIONABLE RESULTS, RECOMMEND RECOLLECT TO VERIFY     PER A. DANIEL RN [id number] 0529 GREEN R   MCH 28.2  26.0 - 34.0 pg   Comment: QUESTIONABLE RESULTS, RECOMMEND RECOLLECT TO VERIFY     PER A. DANIEL RN [id number] 0529 GREEN R   MCHC 33.3  30.0 - 36.0 g/dL   Comment: QUESTIONABLE RESULTS, RECOMMEND RECOLLECT TO VERIFY     PER A. DANIEL RN [id number] 0529 GREEN R   RDW 20.1 (*) 11.5 - 15.5 %  Comment: QUESTIONABLE RESULTS, RECOMMEND RECOLLECT TO VERIFY     PER A. DANIEL RN [phone] GREEN R   Platelets 127 (*) 150 - 400 K/uL   Comment: QUESTIONABLE RESULTS, RECOMMEND RECOLLECT TO VERIFY     PER A. DANIEL RN [phone] 0529 GREEN R  CBC     Status: Abnormal   Collection Time    11/02/13  4:40 AM      Result Value Ref Range   WBC 14.7 (*) 4.0 - 10.5 K/uL   RBC 3.18 (*) 4.22 - 5.81 MIL/uL   Hemoglobin 8.9 (*) 13.0 - 17.0 g/dL   HCT 26.8 (*) 39.0 - 52.0 %   MCV 84.3  78.0 - 100.0 fL   MCH 28.0  26.0 - 34.0 pg   MCHC 33.2  30.0 - 36.0 g/dL   RDW 19.9 (*) 11.5 - 15.5 %   Platelets 114 (*) 150 - 400 K/uL   Comment: REPEATED TO VERIFY     PLATELET COUNT CONFIRMED BY SMEAR  COMPREHENSIVE METABOLIC PANEL     Status: Abnormal   Collection Time    11/02/13  4:40 AM      Result Value Ref Range   Sodium 137  137 - 147 mEq/L   Potassium 3.0 (*) 3.7 - 5.3 mEq/L   Chloride 95 (*) 96 - 112 mEq/L   CO2 21  19 - 32 mEq/L   Glucose, Bld 109 (*) 70 - 99 mg/dL   BUN 29 (*) 6 - 23 mg/dL   Creatinine, Ser 4.98 (*) 0.50 - 1.35 mg/dL   Calcium 7.7 (*) 8.4 - 10.5 mg/dL   Total Protein 4.9 (*) 6.0 - 8.3 g/dL   Albumin 1.7 (*) 3.5 - 5.2 g/dL   AST 78 (*) 0 - 37 U/L   ALT 45  0 - 53 U/L   Alkaline Phosphatase 74  39 - 117 U/L   Total  Bilirubin 0.9  0.3 - 1.2 mg/dL   GFR calc non Af Amer 12 (*) >90 mL/min   GFR calc Af Amer 14 (*) >90 mL/min   Comment: (NOTE)     The eGFR has been calculated using the CKD EPI equation.     This calculation has not been validated in all clinical situations.     eGFR's persistently <90 mL/min signify possible Chronic Kidney     Disease.  GLUCOSE, CAPILLARY     Status: None   Collection Time    11/02/13  7:20 AM      Result Value Ref Range   Glucose-Capillary 90  70 - 99 mg/dL  CLOSTRIDIUM DIFFICILE BY PCR     Status: None   Collection Time    11/02/13 10:04 AM      Result Value Ref Range   C difficile by pcr NEGATIVE  NEGATIVE  OCCULT BLOOD X 1 CARD TO LAB, STOOL     Status: Abnormal   Collection Time    11/02/13 10:25 AM      Result Value Ref Range   Fecal Occult Bld POSITIVE (*) NEGATIVE  GLUCOSE, CAPILLARY     Status: Abnormal   Collection Time    11/02/13 11:47 AM      Result Value Ref Range   Glucose-Capillary 112 (*) 70 - 99 mg/dL   Comment 1 Notify RN    GLUCOSE, CAPILLARY     Status: Abnormal   Collection Time    11/02/13  3:55 PM      Result Value Ref Range  Glucose-Capillary 121 (*) 70 - 99 mg/dL   Comment 1 Notify RN    CBC     Status: Abnormal   Collection Time    11/02/13  6:30 PM      Result Value Ref Range   WBC 14.7 (*) 4.0 - 10.5 K/uL   RBC 3.42 (*) 4.22 - 5.81 MIL/uL   Hemoglobin 9.7 (*) 13.0 - 17.0 g/dL   HCT 28.5 (*) 39.0 - 52.0 %   MCV 83.3  78.0 - 100.0 fL   MCH 28.4  26.0 - 34.0 pg   MCHC 34.0  30.0 - 36.0 g/dL   RDW 19.8 (*) 11.5 - 15.5 %   Platelets 147 (*) 150 - 400 K/uL  GLUCOSE, CAPILLARY     Status: None   Collection Time    11/02/13 10:14 PM      Result Value Ref Range   Glucose-Capillary 86  70 - 99 mg/dL   Comment 1 Documented in Chart     Comment 2 Notify RN    CBC     Status: Abnormal   Collection Time    11/03/13  4:50 AM      Result Value Ref Range   WBC 11.9 (*) 4.0 - 10.5 K/uL   RBC 3.18 (*) 4.22 - 5.81 MIL/uL    Hemoglobin 8.9 (*) 13.0 - 17.0 g/dL   HCT 26.5 (*) 39.0 - 52.0 %   MCV 83.3  78.0 - 100.0 fL   MCH 28.0  26.0 - 34.0 pg   MCHC 33.6  30.0 - 36.0 g/dL   RDW 20.0 (*) 11.5 - 15.5 %   Platelets 155  150 - 400 K/uL  COMPREHENSIVE METABOLIC PANEL     Status: Abnormal   Collection Time    11/03/13  4:50 AM      Result Value Ref Range   Sodium 139  137 - 147 mEq/L   Potassium 4.0  3.7 - 5.3 mEq/L   Comment: DELTA CHECK NOTED   Chloride 97  96 - 112 mEq/L   CO2 19  19 - 32 mEq/L   Glucose, Bld 87  70 - 99 mg/dL   BUN 35 (*) 6 - 23 mg/dL   Creatinine, Ser 5.77 (*) 0.50 - 1.35 mg/dL   Calcium 7.8 (*) 8.4 - 10.5 mg/dL   Total Protein 5.2 (*) 6.0 - 8.3 g/dL   Albumin 1.7 (*) 3.5 - 5.2 g/dL   AST 60 (*) 0 - 37 U/L   ALT 34  0 - 53 U/L   Alkaline Phosphatase 77  39 - 117 U/L   Total Bilirubin 0.8  0.3 - 1.2 mg/dL   GFR calc non Af Amer 10 (*) >90 mL/min   GFR calc Af Amer 11 (*) >90 mL/min   Comment: (NOTE)     The eGFR has been calculated using the CKD EPI equation.     This calculation has not been validated in all clinical situations.     eGFR's persistently <90 mL/min signify possible Chronic Kidney     Disease.  GLUCOSE, CAPILLARY     Status: None   Collection Time    11/03/13  7:55 AM      Result Value Ref Range   Glucose-Capillary 92  70 - 99 mg/dL  GLUCOSE, CAPILLARY     Status: Abnormal   Collection Time    11/03/13 11:42 AM      Result Value Ref Range   Glucose-Capillary 122 (*) 70 - 99 mg/dL  GLUCOSE, CAPILLARY     Status: None   Collection Time    11/03/13  5:33 PM      Result Value Ref Range   Glucose-Capillary 87  70 - 99 mg/dL    Dg Chest Port 1 View  11/03/2013   CLINICAL DATA:  Aortic valve replacement.  EXAM: PORTABLE CHEST - 1 VIEW  COMPARISON:  11/02/2013.  FINDINGS: Right central line is in place with tips in the region of the cavoatrial junction an right atrium.  A left central line is in place. This appears to be in expected position of the left superior  intercostal vein (drains left accessory hemi azygous vein). This may not be adequate for instillation of medication. Exact position indeterminate without lateral view.  Post median sternotomy.  Cardiomegaly.  Pleural effusions greater on the left suspected with consolidation lung bases greater on left which may represent associated atelectasis. Difficult to exclude left base infectious infiltrate in the proper clinical setting.  No gross pneumothorax.  Mild pulmonary vascular congestion most notable centrally.  Prominent aortic knob.  IMPRESSION: Right central line is in place with tips in the region of the cavoatrial junction an right atrium.  A left central line is in place. This appears to be in expected position of the left superior intercostal vein (drains left accessory hemi azygous vein). This may not be adequate for instillation of medication. Exact position indeterminate without lateral view. Clinical correlation recommended. Reposition may be considered.  Cardiomegaly.  Pleural effusions greater on the left suspected with consolidation lung bases greater on left which may represent associated atelectasis. Difficult to exclude left base infectious infiltrate in the proper clinical setting.  These results were called by telephone at the time of interpretation on 11/03/2013 at 8:18 AM to [facility] patient's nurse who verbally acknowledged these results. Request to call physician covering patient with results.   Electronically Signed   By: Chauncey Cruel M.D.   On: 11/03/2013 08:19   Dg Chest Port 1 View  11/02/2013   CLINICAL DATA:  Aortic valve replacement  EXAM: PORTABLE CHEST - 1 VIEW  COMPARISON:  11/01/2013  FINDINGS: Left-sided central lines and right IJ dialysis catheters are all stable in position. Previous median sternotomy noted. Stable cardiomegaly with vascular congestion. Persistent left lower lobe collapse/consolidation with a left effusion. No pneumothorax. No significant interval change. Interval  removal of the mediastinal drain.  IMPRESSION: Stable cardiomegaly with vascular congestion  Persistent left lower lobe collapse/consolidation with a left effusion  Support apparatus unchanged   Electronically Signed   By: Daryll Brod M.D.   On: 11/02/2013 09:01    Positive for anxiety, bad mood, depression and sleep disturbance Blood pressure 89/66, pulse 100, temperature 97.5 F (36.4 C), temperature source Oral, resp. rate 31, height 6' 1.5" (1.867 m), weight 97.3 kg (214 lb 8.1 oz), SpO2 94.00%.   Assessment/Plan: Major depressive disorder, recurrent Adjustment disorder with mixed symptoms of anxiety and mood  Recommendation: Continue Remeron M-tab 15 mg at bedtime for depression and insomnia  Continue Sertraline 50 mg for depression  Continue Klonopin 1 mg PO BID for anxiety Appreciate psychiatric consultation Will sign off at this time May contact [phone] if needed further assistance  Jaleya Pebley,JANARDHAHA R. 11/03/2013, 7:28 PM

## 2013-11-03 NOTE — Progress Notes (Addendum)
      [address]       [place] [address]             [phone]      6 Days Post-Op Procedure(s) (LRB): REDO STERNOTOMY (N/A) REDO BENTALL PROCEDURE, debridment of aoritc root abscess, replacement of aortic root, ascending aorta and aortic valve with homograft. Insertion of left femoral arterial line (N/A) INTRAOPERATIVE TRANSESOPHAGEAL ECHOCARDIOGRAM (N/A)  Subjective:  Bradley [name] states he is tired this morning.  He ambulated farther than he has this morning and tired himself out.  He is scheduled to undergo dialysis.  Objective: Vital signs in last 24 hours: Temp:  [97.5 F (36.4 C)-98.4 F (36.9 C)] 97.7 F (36.5 C) (04/06 0753) Pulse Rate:  [82-109] 109 (04/06 0500) Cardiac Rhythm:  [-] Normal sinus rhythm (04/06 0600) Resp:  [0-30] 25 (04/06 0600) BP: (113-146)/(52-106) 124/68 mmHg (04/06 0600) SpO2:  [92 %-99 %] 96 % (04/06 0600) Weight:  [217 lb 9.6 oz (98.703 kg)] 217 lb 9.6 oz (98.703 kg) (04/06 0500)  Intake/Output from previous day: 04/05 0701 - 04/06 0700 In: 1774.3 [P.O.:838; I.V.:536.3; IV Piggyback:400] Out: 5 [Urine:1; Stool:4]  General appearance: alert, cooperative and no distress Heart: regular rate and rhythm Lungs: clear to auscultation bilaterally Abdomen: soft, non-tender; bowel sounds normal; no masses,  no organomegaly Extremities: edema trace Wound: clean and dry  Lab Results:  Recent Labs  11/02/13 1830 11/03/13 0450  WBC 14.7* 11.9*  HGB 9.7* 8.9*  HCT 28.5* 26.5*  PLT 147* 155   BMET:  Recent Labs  11/02/13 0440 11/03/13 0450  NA 137 139  K 3.0* 4.0  CL 95* 97  CO2 21 19  GLUCOSE 109* 87  BUN 29* 35*  CREATININE 4.98* 5.77*  CALCIUM 7.7* 7.8*    PT/INR: No results found for this basename: LABPROT, INR,  in the last 72 hours ABG    Component Value Date/Time   PHART 7.515* 10/31/2013 1342   HCO3 25.6* 10/31/2013 1342   TCO2 27 10/31/2013 1342   ACIDBASEDEF 1.0 10/29/2013 1536   O2SAT 99.0 10/31/2013 1342     CBG (last 3)   Recent Labs  11/02/13 1555 11/02/13 2214 11/03/13 0755  GLUCAP 121* 86 92    Assessment/Plan: S/P Procedure(s) (LRB): REDO STERNOTOMY (N/A) REDO BENTALL PROCEDURE, debridment of aoritc root abscess, replacement of aortic root, ascending aorta and aortic valve with homograft. Insertion of left femoral arterial line (N/A) INTRAOPERATIVE TRANSESOPHAGEAL ECHOCARDIOGRAM (N/A)  1. CV- NSR tachy, good pressure- wean Dopamine as tolerated, continue Amiodarone 2. Pulm- no acute issues, continue IS 3. Renal- ESRD, scheduled for HD today, patient is volume overloaded, not on diuretic 4. GI- diarrhea, C.Diff negative, some hematochezia- felt to be related to hemorrhoids, will need to watch hemoglobin 5. Dispo- patient slowly progressing, there was question this morning, of movement of Left Subclavian line, however CXR appears stable, continue current care   LOS: 12 days    BARRETT, ERIN 11/03/2013  Over all feels better today, walked in unit Still on dopamine, weaning dopamine off  I have seen and examined Bradley [name] and agree with the above assessment  and plan.  [name] MD Beeper [phone] Office [phone] 11/03/2013 9:16 AM

## 2013-11-03 NOTE — Progress Notes (Signed)
Dr Servando Snare updated dopamine gtt temporarily to 0.5 mcg, increased back to 1 mcg with hemo treatment. Pt remains on 33mcg dopamine, sbp 85-110s with MAP 60s. MD stated leave dopamine gtt at 53mcg. Will continue to monitor. Reather Laurence

## 2013-11-03 NOTE — Progress Notes (Signed)
[name]   Day # 6 nafcillin Day # 6 rifampin Day #4 gentamicin   Subjective: C/ofatigued, RN alerted me to swelling asymmetrically on left forearm near fistula  Antibiotics:  Anti-infectives   Start     Dose/Rate Route Frequency Ordered Stop   11/03/13 1200  gentamicin (GARAMYCIN) IVPB 100 mg     100 mg 200 mL/hr over 30 Minutes Intravenous Every M-W-F (Hemodialysis) 11/01/13 1429 11/13/13 2359   11/02/13 2200  rifampin (RIFADIN) capsule 300 mg     300 mg Oral 3 times per day 11/02/13 1426     11/01/13 1200  gentamicin (GARAMYCIN) IVPB 100 mg  Status:  Discontinued     100 mg 200 mL/hr over 30 Minutes Intravenous Every T-Th-Sa (Hemodialysis) 10/30/13 1037 11/01/13 1429   10/31/13 1200  gentamicin (GARAMYCIN) IVPB 80 mg  Status:  Discontinued     80 mg 100 mL/hr over 30 Minutes Intravenous Every Dialysis 10/30/13 0047 10/30/13 1027   10/30/13 1300  gentamicin (GARAMYCIN) 120 mg in dextrose 5 % 50 mL IVPB     120 mg 106 mL/hr over 30 Minutes Intravenous  Once 10/30/13 1037 10/30/13 1615   10/30/13 0500  gentamicin (GARAMYCIN) IVPB 80 mg     80 mg 100 mL/hr over 30 Minutes Intravenous  Once 10/30/13 0047 10/30/13 0526   10/29/13 1600  levofloxacin (LEVAQUIN) IVPB 500 mg  Status:  Discontinued     500 mg 100 mL/hr over 60 Minutes Intravenous Every 48 hours 10/27/13 1457 10/29/13 0857   10/29/13 1000  rifampin (RIFADIN) 300 mg in sodium chloride 0.9 % 100 mL IVPB     300 mg 200 mL/hr over 30 Minutes Intravenous 3 times per day 10/29/13 0858 11/02/13 1520   10/29/13 1000  nafcillin 2 g in dextrose 5 % 50 mL IVPB     2 g 100 mL/hr over 30 Minutes Intravenous 6 times per day 10/29/13 0901     10/29/13 0900  nafcillin injection 2 g  Status:  Discontinued     2 g Intravenous Every 4 hours 10/29/13 0857 10/29/13 0900   10/29/13 0500  vancomycin (VANCOCIN) IVPB 1000 mg/200 mL premix     1,000 mg 200 mL/hr over 60 Minutes Intravenous  Once 10/28/13  2146 10/29/13 0600   10/29/13 0100  cefUROXime (ZINACEF) 1.5 g in dextrose 5 % 50 mL IVPB  Status:  Discontinued     1.5 g 100 mL/hr over 30 Minutes Intravenous Every 12 hours 10/28/13 2146 10/29/13 0857   10/28/13 0400  vancomycin (VANCOCIN) 1,250 mg in sodium chloride 0.9 % 250 mL IVPB     1,250 mg 166.7 mL/hr over 90 Minutes Intravenous To Surgery 10/27/13 1450 10/28/13 0905   10/28/13 0400  cefUROXime (ZINACEF) 1.5 g in dextrose 5 % 50 mL IVPB     1.5 g 100 mL/hr over 30 Minutes Intravenous To Surgery 10/27/13 1450 10/28/13 2030   10/28/13 0400  cefUROXime (ZINACEF) 750 mg in dextrose 5 % 50 mL IVPB  Status:  Discontinued     750 mg 100 mL/hr over 30 Minutes Intravenous To Surgery 10/27/13 1447 10/28/13 2114   10/27/13 1600  levofloxacin (LEVAQUIN) IVPB 750 mg     750 mg 100 mL/hr over 90 Minutes Intravenous  Once 10/27/13 1456 10/28/13 0231   10/23/13 0800  ceFAZolin (ANCEF) IVPB 1 g/50 mL premix  Status:  Discontinued     1 g 100 mL/hr over 30 Minutes Intravenous  Once  10/22/13 2313 10/22/13 2326   10/22/13 2359  ceFAZolin (ANCEF) IVPB 2 g/50 mL premix  Status:  Discontinued     2 g 100 mL/hr over 30 Minutes Intravenous Every M-W-F (Hemodialysis) 10/22/13 2314 10/28/13 2146   10/22/13 2315  ceFAZolin (ANCEF) IVPB 2 g/50 mL premix  Status:  Discontinued     2 g 100 mL/hr over 30 Minutes Intravenous  Once 10/22/13 2313 10/22/13 2326      Medications: Scheduled Meds: . acetaminophen  1,000 mg Oral 4 times per day  . amiodarone  200 mg Oral Daily  . aspirin EC  81 mg Oral Daily  . bisacodyl  10 mg Oral Daily   Or  . bisacodyl  10 mg Rectal Daily  . chlorhexidine  15 mL Mouth Rinse BID  . clonazePAM  0.5 mg Oral BID  . [START ON 11/04/2013] darbepoetin (ARANESP) injection - DIALYSIS  100 mcg Intravenous Q Tue-HD  . docusate sodium  200 mg Oral Daily  . ferrous [id number] C-folic acid  1 capsule Oral q morning - 10a  . gentamicin  100 mg Intravenous Q M,W,F-HD  .  insulin aspart  0-24 Units Subcutaneous TID AC & HS  . levothyroxine  200 mcg Oral QAC breakfast  . metoprolol tartrate  12.5 mg Oral BID   Or  . metoprolol tartrate  12.5 mg Per Tube BID  . mirtazapine  15 mg Oral QHS  . nafcillin IV  2 g Intravenous 6 times per day  . pantoprazole  40 mg Oral Daily  . rifampin  300 mg Oral 3 times per day  . sertraline  50 mg Oral Daily  . sodium chloride  3 mL Intravenous Q12H   Continuous Infusions: . sodium chloride Stopped (10/30/13 0700)  . sodium chloride 1 mL (11/03/13 0733)  . DOPamine 1 mcg/kg/min (11/03/13 1512)  . lactated ringers Stopped (10/29/13 1900)   PRN Meds:.fentaNYL, metoprolol, ondansetron (ZOFRAN) IV, sodium chloride   Objective: Weight change: 10 lb 5.9 oz (4.703 kg)  Intake/Output Summary (Last 24 hours) at 11/03/13 1754 Last data filed at 11/03/13 1520  Gross per 24 hour  Intake 938.35 ml  Output   2001 ml  Net -1062.65 ml   Blood pressure 93/74, pulse 90, temperature 97.5 F (36.4 C), temperature source Oral, resp. rate 24, height 6' 1.5" (1.867 m), weight 214 lb 8.1 oz (97.3 kg), SpO2 98.00%. Temp:  [97.5 F (36.4 C)-98.4 F (36.9 C)] 97.5 F (36.4 C) (04/06 1520) Pulse Rate:  [82-109] 90 (04/06 1520) Resp:  [0-33] 24 (04/06 1445) BP: (81-144)/(56-106) 93/74 mmHg (04/06 1515) SpO2:  [92 %-100 %] 98 % (04/06 1520) Weight:  [214 lb 8.1 oz (97.3 kg)-220 lb 0.3 oz (99.8 kg)] 214 lb 8.1 oz (97.3 kg) (04/06 1715)  Physical Exam: General: awake alert, extubated and ao  X 3 HEENT: anicteric sclera, , EOMI CVS tachy rate, normal r,   Chest: fairly clear to auscultation bilaterally, Abdomen: soft nontender, nondistended, normal bowel sounds, Extremities: asymmetric edema left forearm      Skin: sternal wound clean  Neuro: nonfocal  Lab Results:  Recent Labs  11/02/13 1830 11/03/13 0450  WBC 14.7* 11.9*  HGB 9.7* 8.9*  HCT 28.5* 26.5*  PLT 147* 155    BMET  Recent Labs  11/02/13 0440  11/03/13 0450  NA 137 139  K 3.0* 4.0  CL 95* 97  CO2 21 19  GLUCOSE 109* 87  BUN 29* 35*  CREATININE 4.98* 5.77*  CALCIUM 7.7*  7.8*    Micro Results: Recent Results (from the past 240 hour(s))  SURGICAL PCR SCREEN     Status: None   Collection Time    10/27/13  1:13 PM      Result Value Ref Range Status   MRSA, PCR NEGATIVE  NEGATIVE Final   Staphylococcus aureus NEGATIVE  NEGATIVE Final   Comment:            The Xpert SA Assay (FDA     approved for NASAL specimens     in patients over 71 years of age),     is one component of     a comprehensive surveillance     program.  Test performance has     been validated by [employer] for patients greater     than or equal to 51 year old.     It is not intended     to diagnose infection nor to     guide or monitor treatment.  TISSUE CULTURE     Status: None   Collection Time    10/28/13  1:20 PM      Result Value Ref Range Status   Specimen Description TISSUE AORTA   Final   Special Requests PT ON VANCOMYCIN AND ZINACEF   Final   Gram Stain     Final   Value: RARE WBC PRESENT,BOTH PMN AND MONONUCLEAR     NO ORGANISMS SEEN     Performed at [facility]   Culture     Final   Value: NO GROWTH 3 DAYS     Performed at [facility]   Report Status 10/31/2013 FINAL   Final  GRAM STAIN     Status: None   Collection Time    10/28/13  2:26 PM      Result Value Ref Range Status   Specimen Description ABSCESS AORTA   Final   Special Requests PT ON VANCOMYCIN AND ZINACEF   Final   Gram Stain     Final   Value: RARE WBC PRESENT, PREDOMINANTLY PMN     NO ORGANISMS SEEN   Report Status 10/28/2013 FINAL   Final  ANAEROBIC CULTURE     Status: None   Collection Time    10/28/13  2:26 PM      Result Value Ref Range Status   Specimen Description ABSCESS AORTA   Final   Special Requests PT ON VANCOMYCIN AND ZINACEF   Final   Gram Stain     Final   Value: RARE WBC PRESENT,BOTH PMN AND MONONUCLEAR     NO  ORGANISMS SEEN     Performed at [facility]   Culture     Final   Value: NO ANAEROBES ISOLATED     Performed at [facility]   Report Status 11/02/2013 FINAL   Final  CULTURE, ROUTINE-ABSCESS     Status: None   Collection Time    10/28/13  2:26 PM      Result Value Ref Range Status   Specimen Description ABSCESS AORTA   Final   Special Requests PT ON VANCOMYCIN AND ZINACEF   Final   Gram Stain     Final   Value: RARE WBC PRESENT, PREDOMINANTLY PMN     NO SQUAMOUS EPITHELIAL CELLS SEEN     NO ORGANISMS SEEN     Performed at [facility]     Performed at [facility]  Final   Value: NO GROWTH 3 DAYS     Performed at [facility]   Report Status 10/31/2013 FINAL   Final  CLOSTRIDIUM DIFFICILE BY PCR     Status: None   Collection Time    11/02/13 10:04 AM      Result Value Ref Range Status   C difficile by pcr NEGATIVE  NEGATIVE Final    Studies/Results: Dg Chest Port 1 View  11/03/2013   CLINICAL DATA:  Aortic valve replacement.  EXAM: PORTABLE CHEST - 1 VIEW  COMPARISON:  11/02/2013.  FINDINGS: Right central line is in place with tips in the region of the cavoatrial junction an right atrium.  A left central line is in place. This appears to be in expected position of the left superior intercostal vein (drains left accessory hemi azygous vein). This may not be adequate for instillation of medication. Exact position indeterminate without lateral view.  Post median sternotomy.  Cardiomegaly.  Pleural effusions greater on the left suspected with consolidation lung bases greater on left which may represent associated atelectasis. Difficult to exclude left base infectious infiltrate in the proper clinical setting.  No gross pneumothorax.  Mild pulmonary vascular congestion most notable centrally.  Prominent aortic knob.  IMPRESSION: Right central line is in place with tips in the region of the cavoatrial junction an right atrium.  A left  central line is in place. This appears to be in expected position of the left superior intercostal vein (drains left accessory hemi azygous vein). This may not be adequate for instillation of medication. Exact position indeterminate without lateral view. Clinical correlation recommended. Reposition may be considered.  Cardiomegaly.  Pleural effusions greater on the left suspected with consolidation lung bases greater on left which may represent associated atelectasis. Difficult to exclude left base infectious infiltrate in the proper clinical setting.  These results were called by telephone at the time of interpretation on 11/03/2013 at 8:18 AM to [facility] patient's nurse who verbally acknowledged these results. Request to call physician covering patient with results.   Electronically Signed   By: Chauncey Cruel M.D.   On: 11/03/2013 08:19   Dg Chest Port 1 View  11/02/2013   CLINICAL DATA:  Aortic valve replacement  EXAM: PORTABLE CHEST - 1 VIEW  COMPARISON:  11/01/2013  FINDINGS: Left-sided central lines and right IJ dialysis catheters are all stable in position. Previous median sternotomy noted. Stable cardiomegaly with vascular congestion. Persistent left lower lobe collapse/consolidation with a left effusion. No pneumothorax. No significant interval change. Interval removal of the mediastinal drain.  IMPRESSION: Stable cardiomegaly with vascular congestion  Persistent left lower lobe collapse/consolidation with a left effusion  Support apparatus unchanged   Electronically Signed   By: Daryll Brod M.D.   On: 11/02/2013 09:01      Assessment/Plan: Bradley [name] is a 58 y.o. 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 WE would use for only 2  weeks and would use as low a dose as possible   With re to Naf vs Ancef we could  repeat MRI of the brain if we wanted to go back to ancef (since CNS penetration is lousy with this) though at present with temp PM in not a good idea, can wait for later   #2 Left forearm edema: I consulted Dr. Oneida Alar from [facility] to see the pateint and he has evaluated and ordered doppler to rule out DVT, possible fistulogram if not DVT and persists for several weeks.        LOS: 12 days   Alcide Evener 11/03/2013, 5:54 PM

## 2013-11-03 NOTE — Progress Notes (Signed)
Physical Therapy Treatment Patient Details Name: Bradley [name] MRN: [id number] DOB: 04-02-1956 Today's Date: 11/03/2013    History of Present Illness Pt admit with sepsis and r/o endocarditis.  Anoxic brain injury.    PT Comments    Progressed well today.  VSS.  Even after HD pt able to complete heavily resistive exercise and progress his ambulation.  Follow Up Recommendations  CIR;Supervision/Assistance - 24 hour     Equipment Recommendations  Rolling walker with 5" wheels;3in1 (PT);Other (comment) (TBA)    Recommendations for Other Services       Precautions / Restrictions Precautions Precautions: Fall;Sternal Precaution Comments: Pt needs reminders for sternal precautions.     Mobility  Bed Mobility Overal bed mobility: Needs Assistance Bed Mobility: Supine to Sit     Supine to sit: Mod assist     General bed mobility comments: needed assist to build momentum and cues for technique and sternal prec.  Transfers Overall transfer level: Needs assistance Equipment used: Rolling walker (2 wheeled) Transfers: Sit to/from Stand Sit to Stand: Mod assist         General transfer comment: cues for sternal precautions  Ambulation/Gait Ambulation/Gait assistance: Min assist Ambulation Distance (Feet): 360 Feet Assistive device: Rolling walker (2 wheeled) Gait Pattern/deviations: Step-through pattern Gait velocity: slow, but able to speed up to command appreciably Gait velocity interpretation: Below normal speed for age/gender General Gait Details: generally steady, occasionally needed help maneuvering RW .  @ episodes of unsteadiness,.   Stairs            Wheelchair Mobility    Modified Rankin (Stroke Patients Only)       Balance Overall balance assessment: Needs assistance Sitting-balance support: Feet supported;No upper extremity supported Sitting balance-Leahy Scale: Fair     Standing balance support: No upper extremity supported Standing  balance-Leahy Scale: Poor Standing balance comment: Improving, but still needs a little stability assist with nothing to hole to                    Cognition Arousal/Alertness: Awake/alert Behavior During Therapy: WFL for tasks assessed/performed Overall Cognitive Status: Within Functional Limits for tasks assessed                      Exercises General Exercises - Lower Extremity Heel Slides: AROM;Strengthening;Both;10 reps;Supine (resistive both flexion and extension) Hip ABduction/ADduction: AROM;Both;15 reps;Supine    General Comments        Pertinent Vitals/Pain spO2  >93 throughout gait.  VSS    Home Living                      Prior Function            PT Goals (current goals can now be found in the care plan section) Acute Rehab PT Goals Patient Stated Goal: to go home PT Goal Formulation: With patient Time For Goal Achievement: 11/08/13 Potential to Achieve Goals: Good Progress towards PT goals: Progressing toward goals    Frequency  Min 3X/week    PT Plan Current plan remains appropriate    Co-evaluation             End of Session   Activity Tolerance: Patient limited by fatigue Patient left: in chair;with call bell/phone within reach;with family/visitor present     Time: [id number] PT Time Calculation (min): 33 min  Charges:  $Gait Training: 8-22 mins $Therapeutic Exercise: 8-22 mins  G Codes:      Kathlean Cinco, Tessie Fass 11/03/2013, 5:30 PM 11/03/2013  Donnella Sham, [place] [phone]  (pager)

## 2013-11-03 NOTE — Procedures (Signed)
I was present at this dialysis session, have reviewed the session itself and made  appropriate changes  Kelly Splinter MD (pgr) [phone]    ([phone] 11/03/2013, 1:39 PM

## 2013-11-03 NOTE — Progress Notes (Signed)
Patient ID: Bradley [name], male   DOB: 1956/02/04, 58 y.o.   MRN: [id number] EVENING ROUNDS NOTE :     [address]       [place] [address]             [phone]                 6 Days Post-Op Procedure(s) (LRB): REDO STERNOTOMY (N/A) REDO BENTALL PROCEDURE, debridment of aoritc root abscess, replacement of aortic root, ascending aorta and aortic valve with homograft. Insertion of left femoral arterial line (N/A) INTRAOPERATIVE TRANSESOPHAGEAL ECHOCARDIOGRAM (N/A)  Total Length of Stay:  LOS: 12 days  BP 98/76  Pulse 98  Temp(Src) 97.5 F (36.4 C) (Oral)  Resp 27  Ht 6' 1.5" (1.867 m)  Wt 214 lb 8.1 oz (97.3 kg)  BMI 27.91 kg/m2  SpO2 95%  .Intake/Output     04/05 0701 - 04/06 0700 04/06 0701 - 04/07 0700   P.O. 838    I.V. (mL/kg) 538.1 (5.5) 206.6 (2.1)   IV Piggyback 400 250   Total Intake(mL/kg) 1776.1 (18) 456.6 (4.7)   Urine (mL/kg/hr) 1 (0) 130 (0.1)   Other  2000 (1.8)   Stool 4 (0)    Chest Tube     Total Output 5 2130   Net +1771.1 -1673.4        Urine Occurrence 1 x    Stool Occurrence 4 x 4 x     . sodium chloride Stopped (10/30/13 0700)  . sodium chloride 1 mL (11/03/13 0733)  . DOPamine 1 mcg/kg/min (11/03/13 1512)  . lactated ringers Stopped (10/29/13 1900)     Lab Results  Component Value Date   WBC 11.9* 11/03/2013   HGB 8.9* 11/03/2013   HCT 26.5* 11/03/2013   PLT 155 11/03/2013   GLUCOSE 87 11/03/2013   CHOL 145 09/25/2013   TRIG 156* 09/25/2013   HDL 27* 09/25/2013   LDLCALC 87 09/25/2013   ALT 34 11/03/2013   AST 60* 11/03/2013   NA 139 11/03/2013   K 4.0 11/03/2013   CL 97 11/03/2013   CREATININE 5.77* 11/03/2013   BUN 35* 11/03/2013   CO2 19 11/03/2013   TSH 10.174* 09/14/2013   PSA 1.60 02/14/2010   INR 1.51* 10/29/2013   HGBA1C 4.9 10/27/2013   Stable day, dialysis today Still on low dose dopamine   [name] MD  Beeper [phone] Office [phone] 11/03/2013 6:23 PM

## 2013-11-03 NOTE — Progress Notes (Signed)
Rehab Admissions Coordinator Note:  Patient was screened by Cleatrice Burke for appropriateness for an Inpatient Acute Rehab Consult per PT recommendations.  At this time, we are recommending Inpatient Rehab consult. Please place order.   Cleatrice Burke 11/03/2013, 9:33 AM  I can be reached at [phone].

## 2013-11-03 NOTE — Progress Notes (Signed)
ANTIBIOTIC CONSULT NOTE - FOLLOW UP  Pharmacy Consult for Gentamicin Indication: Synergy with MSSA prosthetic valve IE  Allergies  Allergen Reactions  . Oxycodone     Gives patient nightmares  . Rifampin Nausea Only    Patient Measurements: Height: 6' 1.5" (186.7 cm) Weight:  (Floor RN to stand pt to weigh post hd tx) IBW/kg (Calculated) : 81.05 Adjusted Body Weight: 88 kg  Vital Signs: Temp: 97.5 F (36.4 C) (04/06 1520) Temp src: Oral (04/06 1520) BP: 93/74 mmHg (04/06 1515) Pulse Rate: 90 (04/06 1520) Intake/Output from previous day: 04/05 0701 - 04/06 0700 In: 1776.1 [P.O.:838; I.V.:538.1; IV Piggyback:400] Out: 5 [Urine:1; Stool:4] Intake/Output from this shift: Total I/O In: 241.2 [I.V.:141.2; IV Piggyback:100] Out: 2000 [Other:2000]  Labs:  Recent Labs  11/01/13 0410  11/02/13 0440 11/02/13 1830 11/03/13 0450  WBC 14.0*  < > 14.7* 14.7* 11.9*  HGB 9.4*  < > 8.9* 9.7* 8.9*  PLT 120*  < > 114* 147* 155  CREATININE 3.93*  --  4.98*  --  5.77*  < > = values in this interval not displayed. Estimated Creatinine Clearance: 17.9 ml/min (by C-G formula based on Cr of 5.77). No results found for this basename: VANCOTROUGH, Corlis Leak, VANCORANDOM, [place], GENTPEAK, GENTRANDOM, TOBRATROUGH, TOBRAPEAK, TOBRARND, AMIKACINPEAK, AMIKACINTROU, AMIKACIN,  in the last 72 hours   Microbiology: Recent Results (from the past 720 hour(s))  CLOSTRIDIUM DIFFICILE BY PCR     Status: None   Collection Time    10/06/13  4:49 AM      Result Value Ref Range Status   C difficile by pcr NEGATIVE  NEGATIVE Final  CLOSTRIDIUM DIFFICILE BY PCR     Status: None   Collection Time    10/22/13 10:04 PM      Result Value Ref Range Status   C difficile by pcr NEGATIVE  NEGATIVE Final  CULTURE, BLOOD (ROUTINE X 2)     Status: None   Collection Time    10/22/13 10:26 PM      Result Value Ref Range Status   Specimen Description BLOOD RIGHT ARM   Final   Special Requests BOTTLES DRAWN  AEROBIC ONLY 3CC   Final   Culture  Setup Time     Final   Value: 10/23/2013 04:42     Performed at [facility]   Culture     Final   Value: NO GROWTH 5 DAYS     Performed at [facility]   Report Status 10/29/2013 FINAL   Final  CULTURE, BLOOD (ROUTINE X 2)     Status: None   Collection Time    10/22/13 10:34 PM      Result Value Ref Range Status   Specimen Description BLOOD RIGHT HAND   Final   Special Requests BOTTLES DRAWN AEROBIC ONLY [facility]   Final   Culture  Setup Time     Final   Value: 10/23/2013 04:42     Performed at [facility]   Culture     Final   Value: NO GROWTH 5 DAYS     Performed at [facility]   Report Status 10/29/2013 FINAL   Final  SURGICAL PCR SCREEN     Status: None   Collection Time    10/27/13  1:13 PM      Result Value Ref Range Status   MRSA, PCR NEGATIVE  NEGATIVE Final   Staphylococcus aureus NEGATIVE  NEGATIVE Final   Comment:  The Xpert SA Assay (FDA     approved for NASAL specimens     in patients over 36 years of age),     is one component of     a comprehensive surveillance     program.  Test performance has     been validated by [employer] for patients greater     than or equal to 81 year old.     It is not intended     to diagnose infection nor to     guide or monitor treatment.  TISSUE CULTURE     Status: None   Collection Time    10/28/13  1:20 PM      Result Value Ref Range Status   Specimen Description TISSUE AORTA   Final   Special Requests PT ON VANCOMYCIN AND ZINACEF   Final   Gram Stain     Final   Value: RARE WBC PRESENT,BOTH PMN AND MONONUCLEAR     NO ORGANISMS SEEN     Performed at [facility]   Culture     Final   Value: NO GROWTH 3 DAYS     Performed at [facility]   Report Status 10/31/2013 FINAL   Final  GRAM STAIN     Status: None   Collection Time    10/28/13  2:26 PM      Result Value Ref Range Status   Specimen Description ABSCESS AORTA    Final   Special Requests PT ON VANCOMYCIN AND ZINACEF   Final   Gram Stain     Final   Value: RARE WBC PRESENT, PREDOMINANTLY PMN     NO ORGANISMS SEEN   Report Status 10/28/2013 FINAL   Final  ANAEROBIC CULTURE     Status: None   Collection Time    10/28/13  2:26 PM      Result Value Ref Range Status   Specimen Description ABSCESS AORTA   Final   Special Requests PT ON VANCOMYCIN AND ZINACEF   Final   Gram Stain     Final   Value: RARE WBC PRESENT,BOTH PMN AND MONONUCLEAR     NO ORGANISMS SEEN     Performed at [facility]   Culture     Final   Value: NO ANAEROBES ISOLATED     Performed at [facility]   Report Status 11/02/2013 FINAL   Final  CULTURE, ROUTINE-ABSCESS     Status: None   Collection Time    10/28/13  2:26 PM      Result Value Ref Range Status   Specimen Description ABSCESS AORTA   Final   Special Requests PT ON VANCOMYCIN AND ZINACEF   Final   Gram Stain     Final   Value: RARE WBC PRESENT, PREDOMINANTLY PMN     NO SQUAMOUS EPITHELIAL CELLS SEEN     NO ORGANISMS SEEN     Performed at [facility]     Performed at [facility]   Culture     Final   Value: NO GROWTH 3 DAYS     Performed at [facility]   Report Status 10/31/2013 FINAL   Final  CLOSTRIDIUM DIFFICILE BY PCR     Status: None   Collection Time    11/02/13 10:04 AM      Result Value Ref Range Status   C difficile by pcr NEGATIVE  NEGATIVE Final    Anti-infectives  Start     Dose/Rate Route Frequency Ordered Stop   11/03/13 1200  gentamicin (GARAMYCIN) IVPB 100 mg     100 mg 200 mL/hr over 30 Minutes Intravenous Every M-W-F (Hemodialysis) 11/01/13 1429 11/13/13 2359   11/02/13 2200  rifampin (RIFADIN) capsule 300 mg     300 mg Oral 3 times per day 11/02/13 1426     11/01/13 1200  gentamicin (GARAMYCIN) IVPB 100 mg  Status:  Discontinued     100 mg 200 mL/hr over 30 Minutes Intravenous Every T-Th-Sa (Hemodialysis) 10/30/13 1037 11/01/13 1429    10/31/13 1200  gentamicin (GARAMYCIN) IVPB 80 mg  Status:  Discontinued     80 mg 100 mL/hr over 30 Minutes Intravenous Every Dialysis 10/30/13 0047 10/30/13 1027   10/30/13 1300  gentamicin (GARAMYCIN) 120 mg in dextrose 5 % 50 mL IVPB     120 mg 106 mL/hr over 30 Minutes Intravenous  Once 10/30/13 1037 10/30/13 1615   10/30/13 0500  gentamicin (GARAMYCIN) IVPB 80 mg     80 mg 100 mL/hr over 30 Minutes Intravenous  Once 10/30/13 0047 10/30/13 0526   10/29/13 1600  levofloxacin (LEVAQUIN) IVPB 500 mg  Status:  Discontinued     500 mg 100 mL/hr over 60 Minutes Intravenous Every 48 hours 10/27/13 1457 10/29/13 0857   10/29/13 1000  rifampin (RIFADIN) 300 mg in sodium chloride 0.9 % 100 mL IVPB     300 mg 200 mL/hr over 30 Minutes Intravenous 3 times per day 10/29/13 0858 11/02/13 1520   10/29/13 1000  nafcillin 2 g in dextrose 5 % 50 mL IVPB     2 g 100 mL/hr over 30 Minutes Intravenous 6 times per day 10/29/13 0901     10/29/13 0900  nafcillin injection 2 g  Status:  Discontinued     2 g Intravenous Every 4 hours 10/29/13 0857 10/29/13 0900   10/29/13 0500  vancomycin (VANCOCIN) IVPB 1000 mg/200 mL premix     1,000 mg 200 mL/hr over 60 Minutes Intravenous  Once 10/28/13 2146 10/29/13 0600   10/29/13 0100  cefUROXime (ZINACEF) 1.5 g in dextrose 5 % 50 mL IVPB  Status:  Discontinued     1.5 g 100 mL/hr over 30 Minutes Intravenous Every 12 hours 10/28/13 2146 10/29/13 0857   10/28/13 0400  vancomycin (VANCOCIN) 1,250 mg in sodium chloride 0.9 % 250 mL IVPB     1,250 mg 166.7 mL/hr over 90 Minutes Intravenous To Surgery 10/27/13 1450 10/28/13 0905   10/28/13 0400  cefUROXime (ZINACEF) 1.5 g in dextrose 5 % 50 mL IVPB     1.5 g 100 mL/hr over 30 Minutes Intravenous To Surgery 10/27/13 1450 10/28/13 2030   10/28/13 0400  cefUROXime (ZINACEF) 750 mg in dextrose 5 % 50 mL IVPB  Status:  Discontinued     750 mg 100 mL/hr over 30 Minutes Intravenous To Surgery 10/27/13 1447 10/28/13 2114    10/27/13 1600  levofloxacin (LEVAQUIN) IVPB 750 mg     750 mg 100 mL/hr over 90 Minutes Intravenous  Once 10/27/13 1456 10/28/13 0231   10/23/13 0800  ceFAZolin (ANCEF) IVPB 1 g/50 mL premix  Status:  Discontinued     1 g 100 mL/hr over 30 Minutes Intravenous  Once 10/22/13 2313 10/22/13 2326   10/22/13 2359  ceFAZolin (ANCEF) IVPB 2 g/50 mL premix  Status:  Discontinued     2 g 100 mL/hr over 30 Minutes Intravenous Every M-W-F (Hemodialysis) 10/22/13 2314 10/28/13 2146  10/22/13 2315  ceFAZolin (ANCEF) IVPB 2 g/50 mL premix  Status:  Discontinued     2 g 100 mL/hr over 30 Minutes Intravenous  Once 10/22/13 2313 10/22/13 2326      Assessment: 58 y.o. M with ESRD who continues on Gentamicin for synergy in the setting of MSSA prosthetic valve IE. The patient skipped HD on 4/4 and received HD today (4/6). Gentamicin was held appropriately on 4/4 and continued post-HD today. Per renal - may need to HD daily for several days to help with extra volume. Will follow along closely to ensure Gentamicin given appropriately.   Goal of Therapy:  Gentamicin peak 3-4 mcg/ml  Plan:  1. Continue Gentamicin 100 mg post HD sessions 2. Will continue to follow HD schedule/duration for appropriateness of Gentamicin doses  Alycia Rossetti, PharmD, BCPS Clinical Pharmacist Pager: [phone] 11/03/2013 4:13 PM

## 2013-11-03 NOTE — Consult Note (Signed)
Pt s/p left radial cephalic AVF by Dr Kellie Simmering approximately 3 weeks ago.  Fistula not currently in use.  Pt developed progressive swelling of left forearm and hand over the last week.  It is slightly improved today.  He also complains of some pain numbness in right first digit. This had been present since his fistula operation.   Filed Vitals:   11/03/13 1430 11/03/13 1445 11/03/13 1500 11/03/13 1515  BP: 89/72 97/64  93/74  Pulse: 90 88 88 89  Temp:      TempSrc:      Resp: 17 24    Height:      Weight:      SpO2:        Left upper extremity: + thrill in fistula, diffuse circumferential edema left forearm and hand, motor function intact 5/5, some slight decrease in sensation in first digit  A: left forearm swelling ? Venous outflow problem probably not central veins since no upper arm swelling.  Numbness in first digit probably some element of neuropraxia secondary to nerves adjacent to radial artery  P: 1. Venous duplex left arm to rule out DVT.  If duplex negative would give some time for swelling to resolve (several weeks) if persists would need fistulogram  Ruta Hinds, MD [facility] Office: [phone] Pager: [phone]

## 2013-11-03 NOTE — Progress Notes (Signed)
UR Completed.  Bradley [name] [id number] 11/03/2013

## 2013-11-04 ENCOUNTER — Inpatient Hospital Stay (HOSPITAL_COMMUNITY): Payer: Medicaid Other

## 2013-11-04 DIAGNOSIS — M79609 Pain in unspecified limb: Secondary | ICD-10-CM

## 2013-11-04 DIAGNOSIS — I359 Nonrheumatic aortic valve disorder, unspecified: Secondary | ICD-10-CM

## 2013-11-04 DIAGNOSIS — M7989 Other specified soft tissue disorders: Secondary | ICD-10-CM

## 2013-11-04 LAB — COMPREHENSIVE METABOLIC PANEL
ALT: 27 U/L (ref 0–53)
AST: 48 U/L — ABNORMAL HIGH (ref 0–37)
Albumin: 1.6 g/dL — ABNORMAL LOW (ref 3.5–5.2)
Alkaline Phosphatase: 77 U/L (ref 39–117)
BUN: 20 mg/dL (ref 6–23)
CO2: 24 mEq/L (ref 19–32)
Calcium: 7.5 mg/dL — ABNORMAL LOW (ref 8.4–10.5)
Chloride: 96 mEq/L (ref 96–112)
Creatinine, Ser: 4.03 mg/dL — ABNORMAL HIGH (ref 0.50–1.35)
GFR calc Af Amer: 18 mL/min — ABNORMAL LOW (ref >90)
GFR calc non Af Amer: 15 mL/min — ABNORMAL LOW (ref >90)
Glucose, Bld: 88 mg/dL (ref 70–99)
Potassium: 3.4 mEq/L — ABNORMAL LOW (ref 3.7–5.3)
Sodium: 137 mEq/L (ref 137–147)
Total Bilirubin: 0.8 mg/dL (ref 0.3–1.2)
Total Protein: 5 g/dL — ABNORMAL LOW (ref 6.0–8.3)

## 2013-11-04 LAB — URINE MICROSCOPIC-ADD ON

## 2013-11-04 LAB — GLUCOSE, CAPILLARY
Glucose-Capillary: 93 mg/dL (ref 70–99)
Glucose-Capillary: 90 mg/dL (ref 70–99)
Glucose-Capillary: 125 mg/dL — ABNORMAL HIGH (ref 70–99)
Glucose-Capillary: 87 mg/dL (ref 70–99)

## 2013-11-04 LAB — URINALYSIS, ROUTINE W REFLEX MICROSCOPIC
Bilirubin Urine: NEGATIVE
Glucose, UA: NEGATIVE mg/dL
Ketones, ur: NEGATIVE mg/dL
Nitrite: NEGATIVE
Protein, ur: 100 mg/dL — AB
Specific Gravity, Urine: 1.016 (ref 1.005–1.030)
Urobilinogen, UA: 0.2 mg/dL (ref 0.0–1.0)
pH: 8.5 — ABNORMAL HIGH (ref 5.0–8.0)

## 2013-11-04 LAB — CBC
HCT: 25.6 % — ABNORMAL LOW (ref 39.0–52.0)
Hemoglobin: 8.8 g/dL — ABNORMAL LOW (ref 13.0–17.0)
MCH: 28.6 pg (ref 26.0–34.0)
MCHC: 34.4 g/dL (ref 30.0–36.0)
MCV: 83.1 fL (ref 78.0–100.0)
Platelets: 157 10*3/uL (ref 150–400)
RBC: 3.08 MIL/uL — ABNORMAL LOW (ref 4.22–5.81)
RDW: 20.2 % — ABNORMAL HIGH (ref 11.5–15.5)
WBC: 8.6 10*3/uL (ref 4.0–10.5)

## 2013-11-04 MED ORDER — GENTAMICIN IN SALINE 1-0.9 MG/ML-% IV SOLN
100.0000 mg | Freq: Once | INTRAVENOUS | Status: AC
  Administered 2013-11-04: 100 mg via INTRAVENOUS
  Filled 2013-11-04: qty 100

## 2013-11-04 MED ORDER — NEPRO/CARBSTEADY PO LIQD
237.0000 mL | Freq: Two times a day (BID) | ORAL | Status: DC
  Administered 2013-11-04: 237 mL via ORAL
  Filled 2013-11-04 (×5): qty 237

## 2013-11-04 MED ORDER — MIDODRINE HCL 5 MG PO TABS
10.0000 mg | ORAL_TABLET | Freq: Two times a day (BID) | ORAL | Status: DC
  Administered 2013-11-04 – 2013-11-09 (×10): 10 mg via ORAL
  Filled 2013-11-04 (×12): qty 2

## 2013-11-04 MED ORDER — POTASSIUM CHLORIDE 10 MEQ/50ML IV SOLN
10.0000 meq | INTRAVENOUS | Status: DC
  Administered 2013-11-04: 10 meq via INTRAVENOUS
  Filled 2013-11-04: qty 50

## 2013-11-04 MED ORDER — GLUCERNA SHAKE PO LIQD: 237.0000 mL | Freq: Two times a day (BID) | ORAL | Status: DC

## 2013-11-04 MED ORDER — GENTAMICIN IN SALINE 1-0.9 MG/ML-% IV SOLN
100.0000 mg | INTRAVENOUS | Status: AC
  Administered 2013-11-05 – 2013-11-12 (×4): 100 mg via INTRAVENOUS
  Filled 2013-11-04 (×5): qty 100

## 2013-11-04 MED ORDER — ALBUMIN HUMAN 25 % IV SOLN
25.0000 g | Freq: Two times a day (BID) | INTRAVENOUS | Status: DC | PRN
  Administered 2013-11-04: 25 g via INTRAVENOUS
  Filled 2013-11-04: qty 100

## 2013-11-04 MED ORDER — ALBUMIN HUMAN 25 % IV SOLN
INTRAVENOUS | Status: AC
  Administered 2013-11-04: 25 g via INTRAVENOUS
  Filled 2013-11-04: qty 100

## 2013-11-04 MED ORDER — ALBUMIN HUMAN 5 % IV SOLN: 12.5000 g | Freq: Once | INTRAVENOUS | Status: AC | PRN

## 2013-11-04 NOTE — Progress Notes (Signed)
Dr Roxan Hockey updated pt status. Updated pt with hemo treatment this shift, >2L removed. Had to titrate dopamine gtt up to 58mcg during treatment, Dr Servando Snare aware. Weaned down to 81mcg, but back to 27mcg. sbp 80-100s with MAP 60s. MD ok to continue dopamine gtt at 58mcg, gave PRN order for albumin x1 for MAP < 60. Updated oob to chair only, no ambulation in hallway today. Pt with complaints of urgency/frequency with urinating, urine culture sent/pending. Will continue to monitor. Reather Laurence

## 2013-11-04 NOTE — Progress Notes (Addendum)
NUTRITION FOLLOW UP  DOCUMENTATION CODES Per approved criteria  -Severe malnutrition in the context of chronic illness   INTERVENTION: Nepro Shake po BID, each supplement provides 425 kcal and 19 grams protein RD to follow for nutrition care plan  NUTRITION DIAGNOSIS: Inadequate oral intake now related to limited appetite as evidenced by RN report, ongoing  Goal: Intake to meet >90% of estimated nutrition needs, progressing  Monitor:  PO & supplemental intake, weight, labs, I/O's  ASSESSMENT: PMHx significant for aortic stenosis, chronic LBBB, thyroid CA post thyroidectomy, ESRD (on HD), HTN, and depression. Recently discharged 3/13 after a one month stay for sepsis, MSSA bacteremia septic brain emboli and presumptive prosthetic valve infective endocarditis. New start to HD as of 2/21. Admitted with septic arthritis and sternal infection/osteomyelitis seeding from his previous bacteremia.  Patient s/p procedures 3/31: REDO STERNOTOMY  REDO BENTALL PROCEDURE WITH HOMOGRAFT DEBRIDEMENT OF ROOT ABSCESS  INTRAOPERATIVE TRANSESOPHAGEAL ECHOCARDIOGRAM  Patient transferred from 5W-Medical to 2S-Surgical ICU post-op.  Patient extubated 4/1.  Advanced to Clear Liquids 4/4 0910, Carbohydrate Modified diet 1759.  Patient on bedside commode upon RD visit.  Per RN, appetite limited.  PO intake variable at 0-50% per flowsheet records.  Would benefit from addition of nutrition supplement -- will order.  Height: Ht Readings from Last 1 Encounters:  10/28/13 6' 1.5" (1.867 m)    Weight: Wt Readings from Last 1 Encounters:  11/04/13 215 lb 2.7 oz (97.6 kg)    BMI:  Body mass index is 28 kg/(m^2).  Re-estimated needs: Kcal: 2100-2250 Protein: 130-145 gm Fluid: per MD  Skin: surgical incisions   Diet Order: Carbohydrate Modified    Intake/Output Summary (Last 24 hours) at 11/04/13 1456 Last data filed at 11/04/13 1250  Gross per 24 hour  Intake 1341.2 ml  Output   4875 ml  Net  -3533.8 ml    Labs:   Recent Labs Lab 10/29/13 0400 10/29/13 1508  10/29/13 1600  11/02/13 0440 11/03/13 0450 11/04/13 0345  NA 135* 137  < >  --   < > 137 139 137  K 5.6* 6.3*  < >  --   < > 3.0* 4.0 3.4*  CL 101 97  < >  --   < > 95* 97 96  CO2 20 21  --   --   < > 21 19 24   BUN 12 15  < >  --   < > 29* 35* 20  CREATININE 2.37* 2.92*  < > 2.97*  < > 4.98* 5.77* 4.03*  CALCIUM 9.3 9.3  --   --   < > 7.7* 7.8* 7.5*  MG 2.2 2.3  --  2.0  --   --   --   --   PHOS  --  5.5*  --   --   --   --   --   --   GLUCOSE 114* 159*  < >  --   < > 109* 87 88  < > = values in this interval not displayed.  CBG (last 3)   Recent Labs  11/03/13 1733 11/03/13 2152 11/04/13 1205  GLUCAP 87 112* 90    Scheduled Meds: . acetaminophen  1,000 mg Oral 4 times per day  . amiodarone  200 mg Oral Daily  . aspirin EC  81 mg Oral Daily  . bisacodyl  10 mg Oral Daily   Or  . bisacodyl  10 mg Rectal Daily  . chlorhexidine  15 mL Mouth Rinse BID  .  clonazePAM  0.5 mg Oral BID  . darbepoetin (ARANESP) injection - DIALYSIS  100 mcg Intravenous Q Tue-HD  . docusate sodium  200 mg Oral Daily  . ferrous [id number] C-folic acid  1 capsule Oral q morning - 10a  . [START ON 11/05/2013] gentamicin  100 mg Intravenous Q M,W,F-1800  . insulin aspart  0-24 Units Subcutaneous TID AC & HS  . levothyroxine  200 mcg Oral QAC breakfast  . mirtazapine  15 mg Oral QHS  . nafcillin IV  2 g Intravenous 6 times per day  . pantoprazole  40 mg Oral Daily  . rifampin  300 mg Oral 3 times per day  . sertraline  50 mg Oral Daily  . sodium chloride  3 mL Intravenous Q12H    Continuous Infusions: . sodium chloride 20 mL/hr at 11/04/13 0700  . DOPamine 4.5 mcg/kg/min (11/04/13 1200)    Past Medical History  Diagnosis Date  . Anxiety   . Depression   . Hypertension   . Hypothyroidism, postsurgical   . Aortic stenosis     s/p [place] with bioprosthetic AVR 02/2010; Last echo (9/11): Moderate LVH, EF  45-50%, AVR functioning appropriately, aortic valve mean gradient 21, diastolic dysfunction. Chest MRA (2/13): Mild to moderate dilatation at the sinus of Valsalva at 4.1 cm, mild dilatation ascending aorta distal to the tube graft at 3.9 cm, moderate dilatation of the innominate artery a 2.1 cm;    . Hx of cardiac cath     a. LHC in 02/2010: normal cors  . Hx of echocardiogram 2014    Echo (10/14): Severe LVH, EF 60-65%, normal wall motion, grade 1 diastolic dysfunction, AVR functioning normally, mild aortic stenosis (mean 19), moderately dilated aorta, mild LAE, mild RVE  . Staphylococcus aureus bacteremia   . Prosthetic valve endocarditis   . Thyroid cancer   . Heart murmur   . ESRD on dialysis     "[facility]; TTS" (10/22/2013)    Past Surgical History  Procedure Laterality Date  . Cardiac catheterization  03/02/2010    NORMAL CORONARY ARTERY  . Sternotomy      REDO  . Transthoracic echocardiogram  03/2010    SHOWED MILD REDUCTION OF LV FUNCTION  . Thyroidectomy  ~ 2005  . Shoulder arthroscopy w/ rotator cuff repair Right 2012  . Av fistula placement Left 10/08/2013    Procedure: ARTERIOVENOUS (AV) FISTULA CREATION- LEFT ARM; Radial Cephalic ;  Surgeon: Mal [name], MD;  Location: [place];  Service: Vascular;  Laterality: Left;  . Aortic valve replacement  2011  . Cardiac valve replacement    . Aortic valve repair  1968  . Tee without cardioversion N/A 10/24/2013    Procedure: TRANSESOPHAGEAL ECHOCARDIOGRAM (TEE);  Surgeon: Dorothy Spark, MD;  Location: [facility] ENDOSCOPY;  Service: Cardiovascular;  Laterality: N/A;  Deneen Harts procedure N/A 10/28/2013    Procedure: REDO BENTALL PROCEDURE, debridment of aoritc root abscess, replacement of aortic root, ascending aorta and aortic valve with homograft. Insertion of left femoral arterial line;  Surgeon: [name], MD;  Location: [place];  Service: Open Heart Surgery;  Laterality: N/A;  . Intraoperative transesophageal echocardiogram  N/A 10/28/2013    Procedure: INTRAOPERATIVE TRANSESOPHAGEAL ECHOCARDIOGRAM;  Surgeon: [name], MD;  Location: [place];  Service: Open Heart Surgery;  Laterality: N/A;    Arthur Holms, RD, LDN Pager #: [phone] After-Hours Pager #: [phone]

## 2013-11-04 NOTE — Progress Notes (Signed)
Dr Servando Snare updated pt status. Updated pt on hemo, dopamine gtt increased to 89mcg over night per MD. MD ok to titrate dopamine gtt up to maintain sbp>90 during hemo treatment, then post hemo attempt to wean back down dopamine infusion. Reather Laurence

## 2013-11-04 NOTE — Progress Notes (Signed)
Patients bladder scanned, pt with complaints of urgency and frequency this shift. Urine sample sent to lab per order. Dr Servando Snare and Dr Jonnie Finner aware. Pt voided small amounts x3 this shift. Bladder scanned with result of 38mL in bladder, pt currently resting comfortably, scheduled tylenol given ~1200. Will continue to monitor. Bradley [name]

## 2013-11-04 NOTE — Progress Notes (Signed)
*  PRELIMINARY RESULTS* Vascular Ultrasound Left upper extremity venous duplex has been completed.  Preliminary findings:   No evidence of DVT or superficial thrombosis.     Landry Mellow, RDMS, RVT  11/04/2013, 12:33 PM

## 2013-11-04 NOTE — Progress Notes (Signed)
Had HD today  Dopamine up to 5 during HD, also received 1 albumin but did get 2400 ml off  BP 102/77  Pulse 112  Temp(Src) 97.4 F (36.3 C) (Oral)  Resp 15  Ht 6' 1.5" (1.867 m)  Wt 215 lb 2.7 oz (97.6 kg)  BMI 28.00 kg/m2  SpO2 95%   Intake/Output Summary (Last 24 hours) at 11/04/13 1826 Last data filed at 11/04/13 1800  Gross per 24 hour  Intake 1503.4 ml  Output   2795 ml  Net -1291.6 ml   UA pending  Midodrine started for BP

## 2013-11-04 NOTE — Progress Notes (Signed)
[facility]   Day # 7  nafcillin Day # 7 rifampin Day #5 gentamicin   Subjective: C/ofatigued, malaise GI upset  Antibiotics:  Anti-infectives   Start     Dose/Rate Route Frequency Ordered Stop   11/05/13 1800  gentamicin (GARAMYCIN) IVPB 100 mg     100 mg 200 mL/hr over 30 Minutes Intravenous Every M-W-F (1800) 11/04/13 1317 11/12/13 2359   11/04/13 1300  gentamicin (GARAMYCIN) IVPB 100 mg     100 mg 200 mL/hr over 30 Minutes Intravenous  Once 11/04/13 1146 11/04/13 1320   11/03/13 1200  gentamicin (GARAMYCIN) IVPB 100 mg  Status:  Discontinued     100 mg 200 mL/hr over 30 Minutes Intravenous Every M-W-F (Hemodialysis) 11/01/13 1429 11/04/13 1317   11/02/13 2200  rifampin (RIFADIN) capsule 300 mg     300 mg Oral 3 times per day 11/02/13 1426     11/01/13 1200  gentamicin (GARAMYCIN) IVPB 100 mg  Status:  Discontinued     100 mg 200 mL/hr over 30 Minutes Intravenous Every T-Th-Sa (Hemodialysis) 10/30/13 1037 11/01/13 1429   10/31/13 1200  gentamicin (GARAMYCIN) IVPB 80 mg  Status:  Discontinued     80 mg 100 mL/hr over 30 Minutes Intravenous Every Dialysis 10/30/13 0047 10/30/13 1027   10/30/13 1300  gentamicin (GARAMYCIN) 120 mg in dextrose 5 % 50 mL IVPB     120 mg 106 mL/hr over 30 Minutes Intravenous  Once 10/30/13 1037 10/30/13 1615   10/30/13 0500  gentamicin (GARAMYCIN) IVPB 80 mg     80 mg 100 mL/hr over 30 Minutes Intravenous  Once 10/30/13 0047 10/30/13 0526   10/29/13 1600  levofloxacin (LEVAQUIN) IVPB 500 mg  Status:  Discontinued     500 mg 100 mL/hr over 60 Minutes Intravenous Every 48 hours 10/27/13 1457 10/29/13 0857   10/29/13 1000  rifampin (RIFADIN) 300 mg in sodium chloride 0.9 % 100 mL IVPB     300 mg 200 mL/hr over 30 Minutes Intravenous 3 times per day 10/29/13 0858 11/02/13 1520   10/29/13 1000  nafcillin 2 g in dextrose 5 % 50 mL IVPB     2 g 100 mL/hr over 30 Minutes Intravenous 6 times per day 10/29/13 0901     10/29/13 0900  nafcillin injection 2 g  Status:  Discontinued     2 g Intravenous Every 4 hours 10/29/13 0857 10/29/13 0900   10/29/13 0500  vancomycin (VANCOCIN) IVPB 1000 mg/200 mL premix     1,000 mg 200 mL/hr over 60 Minutes Intravenous  Once 10/28/13 2146 10/29/13 0600   10/29/13 0100  cefUROXime (ZINACEF) 1.5 g in dextrose 5 % 50 mL IVPB  Status:  Discontinued     1.5 g 100 mL/hr over 30 Minutes Intravenous Every 12 hours 10/28/13 2146 10/29/13 0857   10/28/13 0400  vancomycin (VANCOCIN) 1,250 mg in sodium chloride 0.9 % 250 mL IVPB     1,250 mg 166.7 mL/hr over 90 Minutes Intravenous To Surgery 10/27/13 1450 10/28/13 0905   10/28/13 0400  cefUROXime (ZINACEF) 1.5 g in dextrose 5 % 50 mL IVPB     1.5 g 100 mL/hr over 30 Minutes Intravenous To Surgery 10/27/13 1450 10/28/13 2030   10/28/13 0400  cefUROXime (ZINACEF) 750 mg in dextrose 5 % 50 mL IVPB  Status:  Discontinued     750 mg 100 mL/hr over 30 Minutes Intravenous To Surgery 10/27/13 1447 10/28/13 2114   10/27/13 1600  levofloxacin (LEVAQUIN) IVPB 750 mg     750 mg 100 mL/hr over 90 Minutes Intravenous  Once 10/27/13 1456 10/28/13 0231   10/23/13 0800  ceFAZolin (ANCEF) IVPB 1 g/50 mL premix  Status:  Discontinued     1 g 100 mL/hr over 30 Minutes Intravenous  Once 10/22/13 2313 10/22/13 2326   10/22/13 2359  ceFAZolin (ANCEF) IVPB 2 g/50 mL premix  Status:  Discontinued     2 g 100 mL/hr over 30 Minutes Intravenous Every M-W-F (Hemodialysis) 10/22/13 2314 10/28/13 2146   10/22/13 2315  ceFAZolin (ANCEF) IVPB 2 g/50 mL premix  Status:  Discontinued     2 g 100 mL/hr over 30 Minutes Intravenous  Once 10/22/13 2313 10/22/13 2326      Medications: Scheduled Meds: . acetaminophen  1,000 mg Oral 4 times per day  . amiodarone  200 mg Oral Daily  . aspirin EC  81 mg Oral Daily  . bisacodyl  10 mg Oral Daily   Or  . bisacodyl  10 mg Rectal Daily  . chlorhexidine  15 mL Mouth Rinse BID  . clonazePAM  0.5 mg Oral BID  .  darbepoetin (ARANESP) injection - DIALYSIS  100 mcg Intravenous Q Tue-HD  . docusate sodium  200 mg Oral Daily  . ferrous Q000111Q C-folic acid  1 capsule Oral q morning - 10a  . [START ON 11/05/2013] gentamicin  100 mg Intravenous Q M,W,F-1800  . insulin aspart  0-24 Units Subcutaneous TID AC & HS  . levothyroxine  200 mcg Oral QAC breakfast  . mirtazapine  15 mg Oral QHS  . nafcillin IV  2 g Intravenous 6 times per day  . pantoprazole  40 mg Oral Daily  . rifampin  300 mg Oral 3 times per day  . sertraline  50 mg Oral Daily  . sodium chloride  3 mL Intravenous Q12H   Continuous Infusions: . sodium chloride 20 mL/hr at 11/04/13 0700  . DOPamine 4.5 mcg/kg/min (11/04/13 1200)   PRN Meds:.albumin human, fentaNYL, ondansetron (ZOFRAN) IV, sodium chloride   Objective: Weight change: 2 lb 6.7 oz (1.097 kg)  Intake/Output Summary (Last 24 hours) at 11/04/13 1410 Last data filed at 11/04/13 1250  Gross per 24 hour  Intake 1341.2 ml  Output   4875 ml  Net -3533.8 ml   Blood pressure 105/64, pulse 105, temperature 98.1 F (36.7 C), temperature source Oral, resp. rate 18, height 6' 1.5" (1.867 m), weight 215 lb 2.7 oz (97.6 kg), SpO2 94.00%. Temp:  [97.5 F (36.4 C)-98.2 F (36.8 C)] 98.1 F (36.7 C) (04/07 1208) Pulse Rate:  [87-112] 105 (04/07 1245) Resp:  [0-46] 18 (04/07 1245) BP: (71-110)/(48-76) 105/64 mmHg (04/07 1245) SpO2:  [93 %-100 %] 94 % (04/07 1245) Weight:  [214 lb 8.1 oz (97.3 kg)-215 lb 2.7 oz (97.6 kg)] 215 lb 2.7 oz (97.6 kg) (04/07 0800)  Physical Exam: General: awake alert, extubated and ao  X 3 HEENT: anicteric sclera, , EOMI CVS tachy rate, normal r,   Chest: fairly clear to auscultation bilaterally, Abdomen: soft nontender, nondistended, normal bowel sounds, Extremities: asymmetric edema left forearm      Skin: sternal wound clean  Neuro: nonfocal  Lab Results:  Recent Labs  11/03/13 0450 11/04/13 0345  WBC 11.9* 8.6  HGB 8.9*  8.8*  HCT 26.5* 25.6*  PLT 155 157    BMET  Recent Labs  11/03/13 0450 11/04/13 0345  NA 139 137  K 4.0 3.4*  CL 97 96  CO2 19 24  GLUCOSE 87 88  BUN 35* 20  CREATININE 5.77* 4.03*  CALCIUM 7.8* 7.5*    Micro Results: Recent Results (from the past 240 hour(s))  SURGICAL PCR SCREEN     Status: None   Collection Time    10/27/13  1:13 PM      Result Value Ref Range Status   MRSA, PCR NEGATIVE  NEGATIVE Final   Staphylococcus aureus NEGATIVE  NEGATIVE Final   Comment:            The Xpert SA Assay (FDA     approved for NASAL specimens     in patients over 29 years of age),     is one component of     a comprehensive surveillance     program.  Test performance has     been validated by [employer] for patients greater     than or equal to 9 year old.     It is not intended     to diagnose infection nor to     guide or monitor treatment.  TISSUE CULTURE     Status: None   Collection Time    10/28/13  1:20 PM      Result Value Ref Range Status   Specimen Description TISSUE AORTA   Final   Special Requests PT ON VANCOMYCIN AND ZINACEF   Final   Gram Stain     Final   Value: RARE WBC PRESENT,BOTH PMN AND MONONUCLEAR     NO ORGANISMS SEEN     Performed at [facility]   Culture     Final   Value: NO GROWTH 3 DAYS     Performed at [facility]   Report Status 10/31/2013 FINAL   Final  GRAM STAIN     Status: None   Collection Time    10/28/13  2:26 PM      Result Value Ref Range Status   Specimen Description ABSCESS AORTA   Final   Special Requests PT ON VANCOMYCIN AND ZINACEF   Final   Gram Stain     Final   Value: RARE WBC PRESENT, PREDOMINANTLY PMN     NO ORGANISMS SEEN   Report Status 10/28/2013 FINAL   Final  ANAEROBIC CULTURE     Status: None   Collection Time    10/28/13  2:26 PM      Result Value Ref Range Status   Specimen Description ABSCESS AORTA   Final   Special Requests PT ON VANCOMYCIN AND ZINACEF   Final   Gram Stain      Final   Value: RARE WBC PRESENT,BOTH PMN AND MONONUCLEAR     NO ORGANISMS SEEN     Performed at [facility]     Final   Value: NO ANAEROBES ISOLATED     Performed at [facility]   Report Status 11/02/2013 FINAL   Final  CULTURE, ROUTINE-ABSCESS     Status: None   Collection Time    10/28/13  2:26 PM      Result Value Ref Range Status   Specimen Description ABSCESS AORTA   Final   Special Requests PT ON VANCOMYCIN AND ZINACEF   Final   Gram Stain     Final   Value: RARE WBC PRESENT, PREDOMINANTLY PMN     NO SQUAMOUS EPITHELIAL CELLS SEEN     NO ORGANISMS SEEN  Performed at [facility]     Performed at [facility]   Culture     Final   Value: NO GROWTH 3 DAYS     Performed at [facility]   Report Status 10/31/2013 FINAL   Final  CLOSTRIDIUM DIFFICILE BY PCR     Status: None   Collection Time    11/02/13 10:04 AM      Result Value Ref Range Status   C difficile by pcr NEGATIVE  NEGATIVE Final    Studies/Results: Dg Chest Port 1 View  11/04/2013   CLINICAL DATA:  Check tube position.  EXAM: PORTABLE CHEST - 1 VIEW  COMPARISON:  DG CHEST 1V PORT dated 11/03/2013; DG CHEST 1V PORT dated 11/02/2013  FINDINGS: Left IJ tube in stable position projected over the left upper mediastinum, dialysis catheter noted with tip projected in the right atrium. Stable severe cardiomegaly. Prior median sternotomy. Left lower lobe atelectasis and/or infiltrate with left pleural effusion again noted and unchanged. No pneumothorax.  IMPRESSION: 1. Line and tube positions unchanged. The left IJ tube is projected over the left upper mediastinum as noted on prior study. 2. Stable severe cardiomegaly.  Prior CABG. 3. Persistent left pleural of atelectasis and/or infiltrate with stable left pleural effusion. No change.   Electronically Signed   By: Marcello Moores  Register   On: 11/04/2013 08:02   Dg Chest Port 1 View  11/03/2013   CLINICAL DATA:  Aortic valve  replacement.  EXAM: PORTABLE CHEST - 1 VIEW  COMPARISON:  11/02/2013.  FINDINGS: Right central line is in place with tips in the region of the cavoatrial junction an right atrium.  A left central line is in place. This appears to be in expected position of the left superior intercostal vein (drains left accessory hemi azygous vein). This may not be adequate for instillation of medication. Exact position indeterminate without lateral view.  Post median sternotomy.  Cardiomegaly.  Pleural effusions greater on the left suspected with consolidation lung bases greater on left which may represent associated atelectasis. Difficult to exclude left base infectious infiltrate in the proper clinical setting.  No gross pneumothorax.  Mild pulmonary vascular congestion most notable centrally.  Prominent aortic knob.  IMPRESSION: Right central line is in place with tips in the region of the cavoatrial junction an right atrium.  A left central line is in place. This appears to be in expected position of the left superior intercostal vein (drains left accessory hemi azygous vein). This may not be adequate for instillation of medication. Exact position indeterminate without lateral view. Clinical correlation recommended. Reposition may be considered.  Cardiomegaly.  Pleural effusions greater on the left suspected with consolidation lung bases greater on left which may represent associated atelectasis. Difficult to exclude left base infectious infiltrate in the proper clinical setting.  These results were called by telephone at the time of interpretation on 11/03/2013 at 8:18 AM to [facility] patient's nurse who verbally acknowledged these results. Request to call physician covering patient with results.   Electronically Signed   By: Chauncey Cruel M.D.   On: 11/03/2013 08:19      Assessment/Plan: SHAHBAZ PLACHY is a 58 y.o. 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 WE would use for only 2 weeks and would use as low a dose as possible   With re to Naf vs Ancef we could  repeat MRI of the brain if we wanted to go back to ancef (since CNS penetration is lousy with this) though at present with temp PM in not a good idea, can wait for later   #2 Left forearm edema: I consulted Dr. Oneida Alar from [facility] to see the pateint and he has evaluated and ordered doppler to which was negative.   #3 urinary retention: would not treat anything from urine absent urinary signs or symptoms he is very likely asymptomatically colonized       LOS: 13 days   Alcide Evener 11/04/2013, 2:10 PM

## 2013-11-04 NOTE — Progress Notes (Signed)
Subjective: BP's low on HD, also c/o urinary urgency and pain w urination.  UA sent and showing 25-50 wbc's.  Filed Vitals:   11/04/13 1045 11/04/13 1100 11/04/13 1115 11/04/13 1119  BP: 83/58 85/57 80/62  89/61  Pulse: 106 108 110 112  Temp:      TempSrc:      Resp: 20 22 23 24   Height:      Weight:      SpO2: 96% 94% 95% 95%   Exam: Lying in bed, not in distress +JVD Chest clear on R, dec'd L base RRR no MRG Abd soft, obese NTND Mild bilat LE edema, 2+ L forearm edema Neuro is nf, Ox3 LFA AVF patent, R IJ Diatek  CXR today - no chg L effusion, R mostly clear  Dialysis: TTS [facility] 4h   86kg   3K/2.25 [place]   R IJ Cath / maturing LFA AVF (placed 3/11)  Heparin 8600  Hectorol none     Epo none Recent lab: Hb 10.1 ferritin 836  pth 175   Assessment: 1 Prosthetic valve IE s/p redo Bentall procedure w I&D abcess 3/31- on IV gent/nafcillin and po rifampin 2 ESRD, recent AKI injury from meds vs infxn last admit, never biopsied and no signs of recovery yet; 1st HD was 09/17/12 3 Vol excess (postop)- still up 9kg after HD today 4 Hypotension- start midodrine if OK w primary team 5 L arm AV fistula- patent, placed on 3/11 6 Anemia resumed darbe 100/wk, Hb 8.9, check tsat   Plan- HD today, and tomorrow but only if BP's are better. Would suggest midodrine 10 bid short-term postop to help BP's and to get off of dopamine if OK w primary svc    Kelly Splinter MD  pager [phone]    cell [phone]  11/04/2013, 11:38 AM     Recent Labs Lab 10/29/13 1508  11/02/13 0440 11/03/13 0450 11/04/13 0345  NA 137  < > 137 139 137  K 6.3*  < > 3.0* 4.0 3.4*  CL 97  < > 95* 97 96  CO2 21  < > 21 19 24   GLUCOSE 159*  < > 109* 87 88  BUN 15  < > 29* 35* 20  CREATININE 2.92*  < > 4.98* 5.77* 4.03*  CALCIUM 9.3  < > 7.7* 7.8* 7.5*  PHOS 5.5*  --   --   --   --   < > = values in this interval not displayed.  Recent Labs Lab 11/02/13 0440 11/03/13 0450 11/04/13 0345  AST 78* 60* 48*   ALT 45 34 27  ALKPHOS 74 77 77  BILITOT 0.9 0.8 0.8  PROT 4.9* 5.2* 5.0*  ALBUMIN 1.7* 1.7* 1.6*    Recent Labs Lab 11/02/13 1830 11/03/13 0450 11/04/13 0345  WBC 14.7* 11.9* 8.6  HGB 9.7* 8.9* 8.8*  HCT 28.5* 26.5* 25.6*  MCV 83.3 83.3 83.1  PLT 147* 155 157   . acetaminophen  1,000 mg Oral 4 times per day  . amiodarone  200 mg Oral Daily  . aspirin EC  81 mg Oral Daily  . bisacodyl  10 mg Oral Daily   Or  . bisacodyl  10 mg Rectal Daily  . chlorhexidine  15 mL Mouth Rinse BID  . clonazePAM  0.5 mg Oral BID  . darbepoetin (ARANESP) injection - DIALYSIS  100 mcg Intravenous Q Tue-HD  . docusate sodium  200 mg Oral Daily  . ferrous Q000111Q C-folic acid  1 capsule Oral q  morning - 10a  . gentamicin  100 mg Intravenous Q M,W,F-HD  . insulin aspart  0-24 Units Subcutaneous TID AC & HS  . levothyroxine  200 mcg Oral QAC breakfast  . metoprolol tartrate  12.5 mg Oral BID   Or  . metoprolol tartrate  12.5 mg Per Tube BID  . mirtazapine  15 mg Oral QHS  . nafcillin IV  2 g Intravenous 6 times per day  . pantoprazole  40 mg Oral Daily  . rifampin  300 mg Oral 3 times per day  . sertraline  50 mg Oral Daily  . sodium chloride  3 mL Intravenous Q12H   . sodium chloride Stopped (10/30/13 0700)  . sodium chloride 20 mL/hr at 11/04/13 0700  . DOPamine 5 mcg/kg/min (11/04/13 1100)  . lactated ringers Stopped (10/29/13 1900)   albumin human, fentaNYL, metoprolol, ondansetron (ZOFRAN) IV, sodium chloride

## 2013-11-04 NOTE — Procedures (Signed)
I was present at this dialysis session, have reviewed the session itself and made  appropriate changes  Kelly Splinter MD (pgr) [phone]    [phone] 11/04/2013, 3:08 PM

## 2013-11-04 NOTE — Progress Notes (Signed)
Paged MD Servando Snare regarding low blood pressures, orders received in increase Dopamine from 1-3 mcg/kg/min. Orders implemented. Will continue to monitor.  Rolla Plate

## 2013-11-04 NOTE — Progress Notes (Addendum)
[address]       [place] [address]             [phone]      7 Days Post-Op Procedure(s) (LRB): REDO STERNOTOMY (N/A) REDO BENTALL PROCEDURE, debridment of aoritc root abscess, replacement of aortic root, ascending aorta and aortic valve with homograft. Insertion of left femoral arterial line (N/A) INTRAOPERATIVE TRANSESOPHAGEAL ECHOCARDIOGRAM (N/A)  Subjective:  [name] has multiple complaints this morning.  He states that he just feels terrible.  He complains of sudden onset nausea, insomnia, dysuria, and just over all not feeling well.  He does not understand how nausea can just start within and hour.  + BM  Objective: Vital signs in last 24 hours: Temp:  [97.5 F (36.4 C)-98.2 F (36.8 C)] 97.6 F (36.4 C) (04/07 0753) Pulse Rate:  [87-104] 96 (04/07 0700) Cardiac Rhythm:  [-] Normal sinus rhythm (04/07 0700) Resp:  [0-46] 17 (04/07 0700) BP: (71-114)/(48-96) 95/68 mmHg (04/07 0700) SpO2:  [93 %-100 %] 98 % (04/07 0700) Weight:  [214 lb 8.1 oz (97.3 kg)-220 lb 0.3 oz (99.8 kg)] 215 lb 2.7 oz (97.6 kg) (04/07 0500)  Intake/Output from previous day: 04/06 0701 - 04/07 0700 In: 1156.3 [P.O.:220; I.V.:536.3; IV Piggyback:400] Out: 2330 [Urine:330]  General appearance: alert, cooperative and no distress Heart: regular rate and rhythm Lungs: clear to auscultation bilaterally Abdomen: soft, non-tender; bowel sounds normal; no masses,  no organomegaly Extremities: edema trace Wound: clean and dr  Lab Results:  Recent Labs  11/03/13 0450 11/04/13 0345  WBC 11.9* 8.6  HGB 8.9* 8.8*  HCT 26.5* 25.6*  PLT 155 157   BMET:  Recent Labs  11/03/13 0450 11/04/13 0345  NA 139 137  K 4.0 3.4*  CL 97 96  CO2 19 24  GLUCOSE 87 88  BUN 35* 20  CREATININE 5.77* 4.03*  CALCIUM 7.8* 7.5*    PT/INR: No results found for this basename: LABPROT, INR,  in the last 72 hours ABG    Component Value Date/Time   PHART 7.515* 10/31/2013 1342   HCO3 25.6* 10/31/2013 1342   TCO2 27 10/31/2013 1342   ACIDBASEDEF 1.0 10/29/2013 1536   O2SAT 99.0 10/31/2013 1342   CBG (last 3)   Recent Labs  11/03/13 1142 11/03/13 1733 11/03/13 2152  GLUCAP 122* 87 112*    Assessment/Plan: S/P Procedure(s) (LRB): REDO STERNOTOMY (N/A) REDO BENTALL PROCEDURE, debridment of aoritc root abscess, replacement of aortic root, ascending aorta and aortic valve with homograft. Insertion of left femoral arterial line (N/A) INTRAOPERATIVE TRANSESOPHAGEAL ECHOCARDIOGRAM (N/A)  1. CV- remains on Dopamine which was increased overnight, remains on Amiodarone, Lopressor as tolerated 2. Pulm- no acute issues, continue IS 3. Renal- ESRD, HD this morning, Nephrology following 4. GI- nausea, no signs of ileus, will try zofran, could discontinue Amiodarone  5. GU- dysuria, patient has been incontinent of urine, will send UA 6. Psych- on Zoloft, Clonopin, and Remeron, complains of insomnia, would not feel comfortable adding another sleeping agent, but will discuss with [name] 7. Dispo- patient is slowly progressing, multiple complaints this morning will send UA, try zofran,   LOS: 13 days    BARRETT, ERIN 11/04/2013  Anxious this am, says he had trouble sleeping, nurses reports slept well On dialysis now Wean dopamine after dialysis  Abdomen soft non tender  I have seen and examined [name] and agree with the above assessment  and plan.  [name] MD  Beeper [id number] Office [phone] 11/04/2013 8:43 AM

## 2013-11-04 NOTE — Progress Notes (Addendum)
Physical Therapy Treatment Patient Details Name: Bradley [name] MRN: [id number] DOB: 03-18-1956 Today's Date: 11/04/2013    History of Present Illness Pt admit with sepsis and r/o endocarditis.  Anoxic brain injury.  Underwent redo of sternotomy and Bentall procedure 10/28/13    PT Comments    Pt feeling poorly today and just back from HD, but agreed to participate.  Upon standing he had diarrhea times 3, 2 times before he made it to the commode.  He stood approx 30-40 sec for  least 2 clean up sessions, needing RW for steadiness.  Pt unable to drum up the energy to walk in the hall, but agreed to sit in the chair.   Follow Up Recommendations  CIR;Supervision/Assistance - 24 hour     Equipment Recommendations  Rolling walker with 5" wheels;3in1 (PT);Other (comment)    Recommendations for Other Services Rehab consult     Precautions / Restrictions Precautions Precautions: Fall;Sternal Precaution Comments: Pt needs reminders for sternal precautions.     Mobility  Bed Mobility Overal bed mobility: Needs Assistance Bed Mobility: Supine to Sit     Supine to sit: Mod assist   Sit to sidelying: Min assist General bed mobility comments: cuing for guidance and truncal assist  Transfers Overall transfer level: Needs assistance Equipment used: 1 person hand held assist Transfers: Sit to/from Stand Sit to Stand: Min assist Stand pivot transfers: Min assist       General transfer comment: cues to reinforce sternal precautions; steady assist.  Stood times 5  during pericare for diarrhea  Ambulation/Gait                 Stairs            Wheelchair Mobility    Modified Rankin (Stroke Patients Only)       Balance Overall balance assessment: Needs assistance Sitting-balance support: Feet supported;No upper extremity supported;Bilateral upper extremity supported Sitting balance-Leahy Scale: Fair (felt dizzy today)     Standing balance support: Bilateral  upper extremity supported Standing balance-Leahy Scale: Poor                      Cognition Arousal/Alertness: Awake/alert Behavior During Therapy: Flat affect Overall Cognitive Status: Impaired/Different from baseline Area of Impairment: Problem solving;Attention   Current Attention Level: Selective         Problem Solving: Slow processing;Decreased initiation      Exercises General Exercises - Lower Extremity Heel Slides: AROM;Both;10 reps;Supine (resisted flexion and extension) Hip ABduction/ADduction: AROM;Both;15 reps;Supine    General Comments        Pertinent Vitals/Pain BP initially 70/32, then  avg  80's/50's    Home Living Family/patient expects to be discharged to:: Private residence Living Arrangements: Spouse/significant other;Children;Non-relatives/Friends Available Help at Discharge: Family;Available 24 hours/day Type of Home: House Home Access: Stairs to enter Entrance Stairs-Rails: Can reach both Home Layout: One level Home Equipment: Shower seat - built in      Prior Function Level of Independence: Needs assistance  Gait / Transfers Assistance Needed: Ambulated with RW ADL's / Homemaking Assistance Needed: Pt reports he was able to shave and bathe UB.  Wife assisted with the rest     PT Goals (current goals can now be found in the care plan section) Acute Rehab PT Goals Patient Stated Goal: to go home PT Goal Formulation: With patient Time For Goal Achievement: 11/08/13 Potential to Achieve Goals: Good Progress towards PT goals: Progressing toward goals    Frequency  PT Plan Current plan remains appropriate    Co-evaluation             End of Session   Activity Tolerance: Patient limited by fatigue Patient left: in chair;with call bell/phone within reach     Time: 1329-1405 PT Time Calculation (min): 36 min  Charges:  $Therapeutic Exercise: 8-22 mins $Therapeutic Activity: 8-22 mins                    G  Codes:      Taquita Demby, Tessie Fass 11/04/2013, 4:33 PM 11/04/2013  Donnella Sham, [place] [phone]  (pager)

## 2013-11-04 NOTE — Evaluation (Signed)
Occupational Therapy Evaluation Patient Details Name: Bradley [name] MRN: [id number] DOB: [birth year] Today's Date: 11/04/2013    History of Present Illness Pt admit with sepsis and r/o endocarditis.  Anoxic brain injury.  Underwent redo of sternotomy and Bentall procedure 10/28/13   Clinical Impression   Pt admitted with above. He demonstrates the below listed deficits and will benefit from continued OT to maximize safety and independence with BADLs.  Pt presents to OT with weakness bil. UEs Lt > Rt.; impaired balance, impaired cognition and generalized weakness.  Currently, pt requires total A for BADLs except feeding and grooming tasks and min A for functional transfers.  He will need extensive rehab, recommend CIR.  Will follow acutely      Follow Up Recommendations  CIR;Supervision/Assistance - 24 hour    Equipment Recommendations  None recommended by OT    Recommendations for Other Services Rehab consult     Precautions / Restrictions Precautions Precautions: Fall;Sternal      Mobility Bed Mobility Overal bed mobility: Needs Assistance Bed Mobility: Sit to Sidelying         Sit to sidelying: Min assist General bed mobility comments: step by step verbal cues for precautions and technique and assist for LEs  Transfers Overall transfer level: Needs assistance Equipment used: 1 person hand held assist Transfers: Sit to/from Stand;Stand Pivot Transfers Sit to Stand: Min assist Stand pivot transfers: Min assist       General transfer comment: cues for sternal precautions and assist for balance    Balance Overall balance assessment: Needs assistance Sitting-balance support: Feet supported;No upper extremity supported Sitting balance-Leahy Scale: Fair     Standing balance support: Bilateral upper extremity supported Standing balance-Leahy Scale: Poor                              ADL Overall ADL's : Needs assistance/impaired Eating/Feeding:  Sitting;Set up   Grooming: Wash/dry hands;Wash/dry face;Oral care;Brushing hair;Minimal assistance;Sitting   Upper Body Bathing: Maximal assistance;Sitting   Lower Body Bathing: Total assistance;Sit to/from stand   Upper Body Dressing : Maximal assistance;Sitting   Lower Body Dressing: Total assistance;Sit to/from stand   Toilet Transfer: Minimal assistance;Ambulation;BSC   Toileting- Clothing Manipulation and Hygiene: Total assistance;Sit to/from stand       Functional mobility during ADLs: Minimal assistance (in room) General ADL Comments: Pt fatigues quickly.  Requires encouragement to attempt activity.  Pt stood and performed reaching activities with bil. UEs x 8 reps each with min A for balance     Vision                     Perception     Praxis      Pertinent Vitals/Pain      Hand Dominance Right   Extremity/Trunk Assessment Upper Extremity Assessment Upper Extremity Assessment: RUE deficits/detail;LUE deficits/detail RUE Deficits / Details: full AROM (within confines of sternal precautions).  Strength grossly 4/5 LUE Deficits / Details: Pt with moderate edema Lt. UE; pt with strength grossly 2/5 throughout LUE Coordination: decreased fine motor;decreased gross motor   Lower Extremity Assessment Lower Extremity Assessment: Defer to PT evaluation       Communication Communication Communication: No difficulties   Cognition Arousal/Alertness: Awake/alert Behavior During Therapy: Flat affect Overall Cognitive Status: Impaired/Different from baseline Area of Impairment: Problem solving;Attention   Current Attention Level: Selective         Problem Solving: Slow processing;Decreased initiation  General Comments       Exercises       Shoulder Instructions      Home Living Family/patient expects to be discharged to:: Private residence Living Arrangements: Spouse/significant other;Children;Non-relatives/Friends Available Help at  Discharge: Family;Available 24 hours/day Type of Home: House Home Access: Stairs to enter [employer] of Steps: 5 Entrance Stairs-Rails: Can reach both Home Layout: One level     Bathroom Shower/Tub: Walk-in shower;Door   [employer]: Standard     Home Equipment: Civil engineer, contracting - built in          Prior Functioning/Environment Level of Independence: Needs assistance  Gait / Transfers Assistance Needed: Ambulated with RW ADL's / Homemaking Assistance Needed: Pt reports he was able to shave and bathe UB.  Wife assisted with the rest        OT Diagnosis: Generalized weakness;Cognitive deficits   OT Problem List: Decreased strength;Decreased activity tolerance;Impaired balance (sitting and/or standing);Decreased coordination;Decreased cognition;Decreased safety awareness;Decreased knowledge of use of DME or AE;Decreased knowledge of precautions;Cardiopulmonary status limiting activity;Impaired UE functional use;Increased edema   OT Treatment/Interventions: Self-care/ADL training;Therapeutic exercise;DME and/or AE instruction;Therapeutic activities;Cognitive remediation/compensation;Patient/family education;Balance training    OT Goals(Current goals can be found in the care plan section) Acute Rehab OT Goals Patient Stated Goal: to go home OT Goal Formulation: With patient Time For Goal Achievement: 11/18/13 Potential to Achieve Goals: Good ADL Goals Pt Will Perform Grooming: with min assist;standing Pt Will Perform Upper Body Bathing: with supervision;sitting Pt Will Perform Lower Body Bathing: with mod assist;sit to/from stand Pt Will Perform Upper Body Dressing: with min assist;sitting Pt Will Perform Lower Body Dressing: with mod assist;sit to/from stand Pt Will Transfer to Toilet: with min guard assist;ambulating;regular height toilet;bedside commode Pt Will Perform Toileting - Clothing Manipulation and hygiene: with min guard assist;sit to/from stand  OT  Frequency: Min 3X/week   Barriers to D/C:            Co-evaluation              End of Session Nurse Communication: Mobility status  Activity Tolerance: Patient limited by fatigue Patient left: in bed;with call bell/phone within reach;with nursing/sitter in room   Time: [id number] OT Time Calculation (min): 27 min Charges:  OT General Charges $OT Visit: 1 Procedure OT Treatments $Therapeutic Activity: 8-22 mins G-Codes:    Cori Henningsen M November 26, 2013, 3:50 PM

## 2013-11-04 NOTE — Consult Note (Signed)
Physical Medicine and Rehabilitation Consult  Reason for Consult: Deconditioning due to multiple medical issues Referring Physician: [name]   HPI: Bradley [name] is a 58 y.o. male 58 y.o. male with ESRD that progressed from AKI due to gent vs immune complex disease from sepsis last admission who dialysis TTS at [facility]. During his last admission he was treated from MSSA bacteremia with AV endocarditis and septic emboli causing embolic strokes that was subsequently treated with nafcillin and rifampin and then discharged on Vanc and rifampin with the last dose given 3/24. He was seen in ID clinic and had complaints of left-sided sternoclavicular tenderness that began on 10/17/2013 and noted to have redness. He was admitted to hospital due to concerns of septic arthritis and sternal infection/osteomyelitis seeding from his previous bacteremia. CT scan done suggestive of large paravalvular abscess vs pseudoaneurysm. TEE with EF 45-50% with large echolucid area surrounding right and non-coronary aortic valve suspicious for abscess. Cardiac CT with There is a large paravalvular abscess around the root of the  prosthetic aortic valve measuring 58 x 45 x 21 mm located antero-bilaterally and normal coronaries. No evidence of septic arthritis or osteomyelitis of sternal or manubrial areas. He was started on Cefepime and levaquin for treatment. Psychiatry consulted for input and medications adjusted to help with depression and anxiety. Patient underwent Redo Bentall procedure with hemograft and debridement of root abscess on 10/28/13 by [name]. Patient self-extubated on  10/29/13 but reintubated due to respiratory arrest.   ID recommends High dose rifampin IV for 8 weeks, two weeks Gentamicin X 2 weeks as well as Nafcillin for cerebral as well as MSSA bacteremia. Patient extubated on 10/31/13 and PT evaluation done revealing patient to be deconditioned. Continues to require dopamine for  BP support. LUE edema noted with complaints of tingling left hand but no evidence of DVT per dopplers. Dr Oneida Alar recommends fistulagram if edema does not resolve in a few weeks. CIR recommended by MD and rehab team.        . patient had a "bad day" yesterday, day before was ambulating 360 feet per PT note  Review of Systems  Constitutional: Positive for malaise/fatigue.  HENT: Negative for hearing loss.   Eyes:       Floaters bilaterally.  Respiratory: Positive for shortness of breath.   Cardiovascular: Positive for chest pain and leg swelling.  Gastrointestinal: Positive for nausea and diarrhea. Negative for abdominal pain.  Genitourinary: Positive for dysuria.  Musculoskeletal: Positive for myalgias.  Neurological: Positive for sensory change (numbness bilateral feet and left hand.) and weakness. Negative for dizziness and headaches.  Psychiatric/Behavioral: The patient is nervous/anxious.      Past Medical History  Diagnosis Date  . Anxiety   . Depression   . Hypertension   . Hypothyroidism, postsurgical   . Aortic stenosis     s/p Bentall with bioprosthetic AVR 02/2010; Last echo (9/11): Moderate LVH, EF 45-50%, AVR functioning appropriately, aortic valve mean gradient 21, diastolic dysfunction. Chest MRA (2/13): Mild to moderate dilatation at the sinus of Valsalva at 4.1 cm, mild dilatation ascending aorta distal to the tube graft at 3.9 cm, moderate dilatation of the innominate artery a 2.1 cm;    . Hx of cardiac cath     a. LHC in 02/2010: normal cors  . Hx of echocardiogram 2014    Echo (10/14): Severe LVH, EF 60-65%, normal wall motion, grade 1 diastolic dysfunction, AVR functioning normally, mild aortic stenosis (mean  19), moderately dilated aorta, mild LAE, mild RVE  . Staphylococcus aureus bacteremia   . Prosthetic valve endocarditis   . Thyroid cancer   . Heart murmur   . ESRD on dialysis     "[place]; TTS" (10/22/2013)    Past Surgical History  Procedure  Laterality Date  . Cardiac catheterization  03/02/2010    NORMAL CORONARY ARTERY  . Sternotomy      REDO  . Transthoracic echocardiogram  03/2010    SHOWED MILD REDUCTION OF LV FUNCTION  . Thyroidectomy  ~ 2005  . Shoulder arthroscopy w/ rotator cuff repair Right 2012  . Av fistula placement Left 10/08/2013    Procedure: ARTERIOVENOUS (AV) FISTULA CREATION- LEFT ARM; Radial Cephalic ;  Surgeon: Mal [name], MD;  Location: [place];  Service: Vascular;  Laterality: Left;  . Aortic valve replacement  2011  . Cardiac valve replacement    . Aortic valve repair  1968  . Tee without cardioversion N/A 10/24/2013    Procedure: TRANSESOPHAGEAL ECHOCARDIOGRAM (TEE);  Surgeon: Dorothy Spark, MD;  [facility] ENDOSCOPY;  Service: Cardiovascular;  Laterality: N/A;  Deneen Harts procedure N/A 10/28/2013    Procedure: REDO BENTALL PROCEDURE, debridment of aoritc root abscess, replacement of aortic root, ascending aorta and aortic valve with homograft. Insertion of left femoral arterial line;  Surgeon: [name], MD;  Location: [place];  Service: Open Heart Surgery;  Laterality: N/A;  . Intraoperative transesophageal echocardiogram N/A 10/28/2013    Procedure: INTRAOPERATIVE TRANSESOPHAGEAL ECHOCARDIOGRAM;  Surgeon: [name], MD;  Location: [place];  Service: Open Heart Surgery;  Laterality: N/A;    Family History  Problem Relation Age of Onset  . Heart disease Father     Social History:  Married. Disabled. He was independent and active till Jan this year--works part time--shoes horses. Has a supportive wife and daughter. He reports that he has never smoked. He has never used smokeless tobacco. He reports that he does not drink alcohol or use illicit drugs.   Allergies  Allergen Reactions  . Oxycodone     Gives patient nightmares  . Rifampin Nausea Only    Medications Prior to Admission  Medication Sig Dispense Refill  . acetaminophen (TYLENOL) 500 MG tablet Take 500 mg by mouth every  6 (six) hours as needed for mild pain or fever.      Marland Kitchen aspirin EC 81 MG tablet Take 81 mg by mouth every other day.      . clonazePAM (KLONOPIN) 1 MG tablet Take 1 mg by mouth 2 (two) times daily as needed for anxiety.      . ENSURE PLUS (ENSURE PLUS) LIQD Take 237 mLs by mouth 2 (two) times daily between meals.      Marland Kitchen levothyroxine (SYNTHROID, LEVOTHROID) 200 MCG tablet Take 200 mcg by mouth daily before breakfast.      . rifampin (RIFADIN) 300 MG capsule Take 600 mg by mouth daily.      . sertraline (ZOLOFT) 50 MG tablet Take 50 mg by mouth daily.      . sevelamer carbonate (RENVELA) 800 MG tablet Take 800-1,600 mg by mouth 3 (three) times daily with meals. Takes 1600mg  with larger amounts of food and 800mg  with smaller amounts of food        Home: Home Living Family/patient expects to be discharged to:: Private residence Living Arrangements: Spouse/significant other;Children;Non-relatives/Friends Available Help at Discharge: Family;Available 24 hours/day Type of Home: House Home Access: Stairs to enter [employer] of  Steps: 5 Entrance Stairs-Rails: Can reach both Home Layout: One level Home Equipment: Shower seat - built in  Lives With: Spouse  Functional History: Prior Function Level of Independence: Needs assistance Gait / Transfers Assistance Needed: Ambulated with RW ADL's / Homemaking Assistance Needed: Pt reports he was able to shave and bathe UB.  Wife assisted with the rest Functional Status:  Mobility: Bed Mobility Overal bed mobility: Needs Assistance Bed Mobility: Supine to Sit Rolling: Min assist;+2 for physical assistance Supine to sit: Mod assist Sit to sidelying: Min assist General bed mobility comments: cuing for guidance and truncal assist Transfers Overall transfer level: Needs assistance Equipment used: 1 person hand held assist Transfers: Sit to/from Stand Sit to Stand: Min assist Stand pivot transfers: Min assist General transfer comment:  cues to reinforce sternal precautions; steady assist.  Stood times 5  during pericare for diarrhea Ambulation/Gait Ambulation/Gait assistance: Min assist Ambulation Distance (Feet): 360 Feet Assistive device: Rolling walker (2 wheeled) Gait Pattern/deviations: Step-through pattern Gait velocity: slow, but able to speed up to command appreciably Gait velocity interpretation: Below normal speed for age/gender General Gait Details: generally steady, occasionally needed help maneuvering RW .  @ episodes of unsteadiness,.    ADL: ADL Overall ADL's : Needs assistance/impaired Eating/Feeding: Sitting;Set up Grooming: Wash/dry hands;Wash/dry face;Oral care;Brushing hair;Minimal assistance;Sitting Upper Body Bathing: Maximal assistance;Sitting Lower Body Bathing: Total assistance;Sit to/from stand Upper Body Dressing : Maximal assistance;Sitting Lower Body Dressing: Total assistance;Sit to/from stand Toilet Transfer: Minimal assistance;Ambulation;BSC Toileting- Clothing Manipulation and Hygiene: Total assistance;Sit to/from stand Functional mobility during ADLs: Minimal assistance (in room) General ADL Comments: Pt fatigues quickly.  Requires encouragement to attempt activity.  Pt stood and performed reaching activities with bil. UEs x 8 reps each with min A for balance  Cognition: Cognition Overall Cognitive Status: Impaired/Different from baseline Orientation Level: Oriented to person;Oriented to place;Oriented to time;Oriented to situation;Oriented X4 Cognition Arousal/Alertness: Awake/alert Behavior During Therapy: Flat affect Overall Cognitive Status: Impaired/Different from baseline Area of Impairment: Problem solving;Attention Orientation Level: Time;Situation Current Attention Level: Selective Memory: Decreased short-term memory Following Commands: Follows multi-step commands with increased time Safety/Judgement: Decreased awareness of safety Awareness: Emergent Problem  Solving: Slow processing;Decreased initiation  Blood pressure 82/63, pulse 88, temperature 97.5 F (36.4 C), temperature source Oral, resp. rate 22, height 6' 1.5" (1.867 m), weight 96.3 kg (212 lb 4.9 oz), SpO2 92.00%. Physical Exam  Nursing note and vitals reviewed. Constitutional: He is oriented to person, place, and time. He appears well-developed and well-nourished.  HENT:  Head: Normocephalic and atraumatic.  Eyes: Conjunctivae are normal. Pupils are equal, round, and reactive to light.  Neck:  Central line on the left and IJ cath on the right.   Cardiovascular: Regular rhythm.  Tachycardia present.   Respiratory: Effort normal. No respiratory distress. He has decreased breath sounds. He has no wheezes.  GI: Soft. Bowel sounds are normal. He exhibits no distension. There is no tenderness.  Musculoskeletal:  LUE with 2+ edema.   Neurological: He is alert and oriented to person, place, and time.  Follows commands without difficulty. Moves all four.   Skin: Skin is warm and dry.   4/5 strength right deltoid, bicep, tricep, grip, and bilateral hip flexor, knee extensors, ankle dorsi flexion  4 minus/5 in the left deltoid, bicep, tricep, grip Sensory intact to light touch in bilateral upper and lower extremity  Results for orders placed during the hospital encounter of 10/22/13 (from the past 24 hour(s))  GLUCOSE, CAPILLARY     Status:  None   Collection Time    11/04/13  7:51 AM      Result Value Ref Range   Glucose-Capillary 93  70 - 99 mg/dL  URINALYSIS, ROUTINE W REFLEX MICROSCOPIC     Status: Abnormal   Collection Time    11/04/13  9:45 AM      Result Value Ref Range   Color, Urine AMBER (*) YELLOW   APPearance HAZY (*) CLEAR   Specific Gravity, Urine 1.016  1.005 - 1.030   pH 8.5 (*) 5.0 - 8.0   Glucose, UA NEGATIVE  NEGATIVE mg/dL   Hgb urine dipstick SMALL (*) NEGATIVE   Bilirubin Urine NEGATIVE  NEGATIVE   Ketones, ur NEGATIVE  NEGATIVE mg/dL   Protein, ur 100 (*)  NEGATIVE mg/dL   Urobilinogen, UA 0.2  0.0 - 1.0 mg/dL   Nitrite NEGATIVE  NEGATIVE   Leukocytes, UA SMALL (*) NEGATIVE  URINE MICROSCOPIC-ADD ON     Status: Abnormal   Collection Time    11/04/13  9:45 AM      Result Value Ref Range   Squamous Epithelial / LPF FEW (*) RARE   WBC, UA 21-50  <3 WBC/hpf   RBC / HPF 3-6  <3 RBC/hpf   Bacteria, UA FEW (*) RARE   Casts GRANULAR CAST (*) NEGATIVE  GLUCOSE, CAPILLARY     Status: None   Collection Time    11/04/13 12:05 PM      Result Value Ref Range   Glucose-Capillary 90  70 - 99 mg/dL  GLUCOSE, CAPILLARY     Status: Abnormal   Collection Time    11/04/13  4:34 PM      Result Value Ref Range   Glucose-Capillary 125 (*) 70 - 99 mg/dL   Comment 1 Notify RN    GLUCOSE, CAPILLARY     Status: None   Collection Time    11/04/13  9:49 PM      Result Value Ref Range   Glucose-Capillary 87  70 - 99 mg/dL   Comment 1 Documented in Chart     Comment 2 Notify RN    CBC     Status: Abnormal   Collection Time    11/05/13  4:35 AM      Result Value Ref Range   WBC 8.6  4.0 - 10.5 K/uL   RBC 2.99 (*) 4.22 - 5.81 MIL/uL   Hemoglobin 8.4 (*) 13.0 - 17.0 g/dL   HCT 25.2 (*) 39.0 - 52.0 %   MCV 84.3  78.0 - 100.0 fL   MCH 28.1  26.0 - 34.0 pg   MCHC 33.3  30.0 - 36.0 g/dL   RDW 20.4 (*) 11.5 - 15.5 %   Platelets 202  150 - 400 K/uL  COMPREHENSIVE METABOLIC PANEL     Status: Abnormal   Collection Time    11/05/13  4:35 AM      Result Value Ref Range   Sodium 139  137 - 147 mEq/L   Potassium 3.6 (*) 3.7 - 5.3 mEq/L   Chloride 99  96 - 112 mEq/L   CO2 26  19 - 32 mEq/L   Glucose, Bld 91  70 - 99 mg/dL   BUN 16  6 - 23 mg/dL   Creatinine, Ser 3.45 (*) 0.50 - 1.35 mg/dL   Calcium 7.7 (*) 8.4 - 10.5 mg/dL   Total Protein 5.2 (*) 6.0 - 8.3 g/dL   Albumin 1.8 (*) 3.5 - 5.2 g/dL   AST 37  0 - 37 U/L   ALT 21  0 - 53 U/L   Alkaline Phosphatase 73  39 - 117 U/L   Total Bilirubin 0.8  0.3 - 1.2 mg/dL   GFR calc non Af Amer 18 (*) >90 mL/min     GFR calc Af Amer 21 (*) >90 mL/min   Dg Chest Port 1 View  11/04/2013   CLINICAL DATA:  Check tube position.  EXAM: PORTABLE CHEST - 1 VIEW  COMPARISON:  DG CHEST 1V PORT dated 11/03/2013; DG CHEST 1V PORT dated 11/02/2013  FINDINGS: Left IJ tube in stable position projected over the left upper mediastinum, dialysis catheter noted with tip projected in the right atrium. Stable severe cardiomegaly. Prior median sternotomy. Left lower lobe atelectasis and/or infiltrate with left pleural effusion again noted and unchanged. No pneumothorax.  IMPRESSION: 1. Line and tube positions unchanged. The left IJ tube is projected over the left upper mediastinum as noted on prior study. 2. Stable severe cardiomegaly.  Prior CABG. 3. Persistent left pleural of atelectasis and/or infiltrate with stable left pleural effusion. No change.   Electronically Signed   By: Marcello Moores  Register   On: 11/04/2013 08:02    Assessment/Plan: Diagnosis: Deconditioning after prosthetic aortic valve endocarditis 1. Does the need for close, 24 hr/day medical supervision in concert with the patient's rehab needs make it unreasonable for this patient to be served in a less intensive setting? Potentially 2. Co-Morbidities requiring supervision/potential complications: Hypertension, depression, CHF, renal failure 3. Due to bowel management, safety, skin/wound care, disease management, medication administration, pain management and patient education, does the patient require 24 hr/day rehab nursing? Potentially 4. Does the patient require coordinated care of a physician, rehab nurse, PT (1-2 hrs/day, 5 days/week) and OT (1-2 hrs/day, 5 days/week) to address physical and functional deficits in the context of the above medical diagnosis(es)? Potentially Addressing deficits in the following areas: balance, endurance, locomotion, strength, transferring, bowel/bladder control, bathing, dressing, feeding, grooming, toileting and cognition 5. Can the  patient actively participate in an intensive therapy program of at least 3 hrs of therapy per day at least 5 days per week? Potentially 6. The potential for patient to make measurable gains while on inpatient rehab is good 7. Anticipated functional outcomes upon discharge from inpatient rehab are modified independent  with PT, modified independent with OT, n/a with SLP. 8. Estimated rehab length of stay to reach the above functional goals is: 7 days 9. Does the patient have adequate social supports to accommodate these discharge functional goals? Potentially 10. Anticipated D/C setting: Home 11. Anticipated post D/C treatments: [place] Level therapy 12. Overall Rehab/Functional Prognosis: excellent  RECOMMENDATIONS: This patient's condition is appropriate for continued rehabilitative care in the following setting: If patient progresses to supervision level than home with home health Patient has agreed to participate in recommended program. Potentially Note that insurance prior authorization may be required for reimbursement for recommended care.  Comment: Rehabilitation RN to follow    11/05/2013

## 2013-11-04 NOTE — Consult Note (Signed)
Duplex no evidence of DVT.  Will recheck in a few weeks if persistent swelling will consider fistulogram.  Ruta Hinds, MD [facility]: [phone] Pager: [phone]

## 2013-11-05 DIAGNOSIS — T82897A Other specified complication of cardiac prosthetic devices, implants and grafts, initial encounter: Secondary | ICD-10-CM

## 2013-11-05 DIAGNOSIS — I330 Acute and subacute infective endocarditis: Secondary | ICD-10-CM

## 2013-11-05 DIAGNOSIS — R5381 Other malaise: Secondary | ICD-10-CM

## 2013-11-05 LAB — COMPREHENSIVE METABOLIC PANEL
ALT: 21 U/L (ref 0–53)
AST: 37 U/L (ref 0–37)
Albumin: 1.8 g/dL — ABNORMAL LOW (ref 3.5–5.2)
Alkaline Phosphatase: 73 U/L (ref 39–117)
BUN: 16 mg/dL (ref 6–23)
CO2: 26 mEq/L (ref 19–32)
Calcium: 7.7 mg/dL — ABNORMAL LOW (ref 8.4–10.5)
Chloride: 99 mEq/L (ref 96–112)
Creatinine, Ser: 3.45 mg/dL — ABNORMAL HIGH (ref 0.50–1.35)
GFR calc Af Amer: 21 mL/min — ABNORMAL LOW (ref >90)
GFR calc non Af Amer: 18 mL/min — ABNORMAL LOW (ref >90)
Glucose, Bld: 91 mg/dL (ref 70–99)
Potassium: 3.6 mEq/L — ABNORMAL LOW (ref 3.7–5.3)
Sodium: 139 mEq/L (ref 137–147)
Total Bilirubin: 0.8 mg/dL (ref 0.3–1.2)
Total Protein: 5.2 g/dL — ABNORMAL LOW (ref 6.0–8.3)

## 2013-11-05 LAB — URINE CULTURE
Colony Count: NO GROWTH
Culture: NO GROWTH
Special Requests: NORMAL

## 2013-11-05 LAB — GLUCOSE, CAPILLARY
Glucose-Capillary: 101 mg/dL — ABNORMAL HIGH (ref 70–99)
Glucose-Capillary: 77 mg/dL (ref 70–99)
Glucose-Capillary: 88 mg/dL (ref 70–99)
Glucose-Capillary: 91 mg/dL (ref 70–99)
Glucose-Capillary: 92 mg/dL (ref 70–99)

## 2013-11-05 LAB — CBC
HCT: 25.2 % — ABNORMAL LOW (ref 39.0–52.0)
Hemoglobin: 8.4 g/dL — ABNORMAL LOW (ref 13.0–17.0)
MCH: 28.1 pg (ref 26.0–34.0)
MCHC: 33.3 g/dL (ref 30.0–36.0)
MCV: 84.3 fL (ref 78.0–100.0)
Platelets: 202 10*3/uL (ref 150–400)
RBC: 2.99 MIL/uL — ABNORMAL LOW (ref 4.22–5.81)
RDW: 20.4 % — ABNORMAL HIGH (ref 11.5–15.5)
WBC: 8.6 10*3/uL (ref 4.0–10.5)

## 2013-11-05 MED ORDER — PENTAFLUOROPROP-TETRAFLUOROETH EX AERO: 1.0000 "application " | INHALATION_SPRAY | CUTANEOUS | Status: DC | PRN

## 2013-11-05 MED ORDER — LIDOCAINE HCL (PF) 1 % IJ SOLN: 5.0000 mL | INTRAMUSCULAR | Status: DC | PRN

## 2013-11-05 MED ORDER — ALBUMIN HUMAN 25 % IV SOLN
INTRAVENOUS | Status: AC
  Filled 2013-11-05: qty 100

## 2013-11-05 MED ORDER — ALPRAZOLAM 0.5 MG PO TABS
0.5000 mg | ORAL_TABLET | Freq: Three times a day (TID) | ORAL | Status: DC | PRN
  Administered 2013-11-06 – 2013-11-12 (×9): 0.5 mg via ORAL
  Filled 2013-11-05 (×8): qty 1

## 2013-11-05 MED ORDER — SODIUM CHLORIDE 0.9 % IV SOLN: 100.0000 mL | INTRAVENOUS | Status: DC | PRN

## 2013-11-05 MED ORDER — HEPARIN SODIUM (PORCINE) 1000 UNIT/ML DIALYSIS
1000.0000 [IU] | INTRAMUSCULAR | Status: DC | PRN
  Administered 2013-11-06: 4100 [IU] via INTRAVENOUS_CENTRAL
  Filled 2013-11-05: qty 1

## 2013-11-05 MED ORDER — NEPRO/CARBSTEADY PO LIQD
237.0000 mL | Freq: Two times a day (BID) | ORAL | Status: DC
  Administered 2013-11-08 – 2013-11-13 (×6): 237 mL via ORAL
  Filled 2013-11-05 (×20): qty 237

## 2013-11-05 MED ORDER — LIDOCAINE-PRILOCAINE 2.5-2.5 % EX CREA
1.0000 | TOPICAL_CREAM | CUTANEOUS | Status: DC | PRN
Start: 2013-11-05 — End: 2013-11-07

## 2013-11-05 MED ORDER — NEPRO/CARBSTEADY PO LIQD
237.0000 mL | ORAL | Status: DC | PRN
  Filled 2013-11-05: qty 237

## 2013-11-05 MED ORDER — ALTEPLASE 2 MG IJ SOLR: 2.0000 mg | Freq: Once | INTRAMUSCULAR | Status: AC | PRN

## 2013-11-05 MED ORDER — HEPARIN SODIUM (PORCINE) 1000 UNIT/ML DIALYSIS
2000.0000 [IU] | Freq: Once | INTRAMUSCULAR | Status: AC
  Administered 2013-11-05: 2000 [IU] via INTRAVENOUS_CENTRAL

## 2013-11-05 NOTE — Progress Notes (Signed)
CT surgery p.m. Rounds  The patient successfully weaned off dopamine today, evening blood pressure 95/60 Patient was evaluated by inpatient rehabilitation today Otherwise stable, tolerated dialysis.

## 2013-11-05 NOTE — Progress Notes (Signed)
Subjective: 2.4kg off yest, on midodrine and dopamine almost weaned off  Filed Vitals:   11/05/13 1000 11/05/13 1015 11/05/13 1030 11/05/13 1045  BP: 137/121 101/64 98/70 110/75  Pulse: 63 91 89 86  Temp:      TempSrc:      Resp: 23 18 19 16   Height:      Weight:      SpO2: 92% 90% 96% 100%   Exam: Up in chair, looks better today +JVD Chest clear on R, dec'd L base RRR no MRG Abd soft, obese NTND Bilat pitting LE and UE edema,  Neuro is nf, Ox3 LFA AVF patent, R IJ Diatek  CXR today - no chg L effusion, R mostly clear  Dialysis: TTS [facility] 4h   86kg   3K/2.25 [place]   R IJ Cath / maturing LFA AVF (placed 3/11)  Heparin 8600  Hectorol none     Epo none Recent lab: Hb 10.1 ferritin 836  pth 175   Assessment: 1 Prosthetic valve endocarditis, s/p redo AVR- IV abx per ID 3 Vol excess- up 10 kg now, improving slowly but low BP's limiting vol removal 4 ESRD - recent AKI during hospital stay in Feb, 1st HD was 09/17/12 4 Hypotension- started midodrine 10 bid, weaning pressors 5 L arm AV fistula- patent, placed on 3/11 6 Anemia resumed darbe 100/wk, Hb 8.9, check tsat   Plan- Continue daily HD for now, cont midodrine, lower volume    Kelly Splinter MD  pager [phone]    cell [phone]  11/05/2013, 11:40 AM     Recent Labs Lab 10/29/13 1508  11/03/13 0450 11/04/13 0345 11/05/13 0435  NA 137  < > 139 137 139  K 6.3*  < > 4.0 3.4* 3.6*  CL 97  < > 97 96 99  CO2 21  < > 19 24 26   GLUCOSE 159*  < > 87 88 91  BUN 15  < > 35* 20 16  CREATININE 2.92*  < > 5.77* 4.03* 3.45*  CALCIUM 9.3  < > 7.8* 7.5* 7.7*  PHOS 5.5*  --   --   --   --   < > = values in this interval not displayed.  Recent Labs Lab 11/03/13 0450 11/04/13 0345 11/05/13 0435  AST 60* 48* 37  ALT 34 27 21  ALKPHOS 77 77 73  BILITOT 0.8 0.8 0.8  PROT 5.2* 5.0* 5.2*  ALBUMIN 1.7* 1.6* 1.8*    Recent Labs Lab 11/03/13 0450 11/04/13 0345 11/05/13 0435  WBC 11.9* 8.6 8.6  HGB 8.9* 8.8* 8.4*   HCT 26.5* 25.6* 25.2*  MCV 83.3 83.1 84.3  PLT 155 157 202   . amiodarone  200 mg Oral Daily  . aspirin EC  81 mg Oral Daily  . bisacodyl  10 mg Oral Daily   Or  . bisacodyl  10 mg Rectal Daily  . chlorhexidine  15 mL Mouth Rinse BID  . clonazePAM  0.5 mg Oral BID  . darbepoetin (ARANESP) injection - DIALYSIS  100 mcg Intravenous Q Tue-HD  . docusate sodium  200 mg Oral Daily  . [START ON 11/07/2013] feeding supplement (NEPRO CARB STEADY)  237 mL Oral BID BM  . ferrous Q000111Q C-folic acid  1 capsule Oral q morning - 10a  . gentamicin  100 mg Intravenous Q M,W,F-1800  . insulin aspart  0-24 Units Subcutaneous TID AC & HS  . levothyroxine  200 mcg Oral QAC breakfast  . midodrine  10 mg  Oral BID WC  . mirtazapine  15 mg Oral QHS  . nafcillin IV  2 g Intravenous 6 times per day  . pantoprazole  40 mg Oral Daily  . rifampin  300 mg Oral 3 times per day  . sertraline  50 mg Oral Daily  . sodium chloride  3 mL Intravenous Q12H   . sodium chloride 20 mL/hr at 11/04/13 2146  . DOPamine 0.5 mcg/kg/min (11/05/13 0900)   albumin human, ALPRAZolam, fentaNYL, ondansetron (ZOFRAN) IV, sodium chloride

## 2013-11-05 NOTE — Progress Notes (Signed)
[facility] for [facility]ious [facility]   Day # 8  nafcillin Day # 8 rifampin Day # 6 gentamicin   Subjective: Feels much better in better spirits and tolerating another HD with midodrine on board  Antibiotics:  Anti-infectives   Start     Dose/Rate Route Frequency Ordered Stop   11/05/13 1800  gentamicin (GARAMYCIN) IVPB 100 mg     100 mg 200 mL/hr over 30 Minutes Intravenous Every M-W-F (1800) 11/04/13 1317 11/12/13 2359   11/04/13 1300  gentamicin (GARAMYCIN) IVPB 100 mg     100 mg 200 mL/hr over 30 Minutes Intravenous  Once 11/04/13 1146 11/04/13 1320   11/03/13 1200  gentamicin (GARAMYCIN) IVPB 100 mg  Status:  Discontinued     100 mg 200 mL/hr over 30 Minutes Intravenous Every M-W-F (Hemodialysis) 11/01/13 1429 11/04/13 1317   11/02/13 2200  rifampin (RIFADIN) capsule 300 mg     300 mg Oral 3 times per day 11/02/13 1426     11/01/13 1200  gentamicin (GARAMYCIN) IVPB 100 mg  Status:  Discontinued     100 mg 200 mL/hr over 30 Minutes Intravenous Every T-Th-Sa (Hemodialysis) 10/30/13 1037 11/01/13 1429   10/31/13 1200  gentamicin (GARAMYCIN) IVPB 80 mg  Status:  Discontinued     80 mg 100 mL/hr over 30 Minutes Intravenous Every Dialysis 10/30/13 0047 10/30/13 1027   10/30/13 1300  gentamicin (GARAMYCIN) 120 mg in dextrose 5 % 50 mL IVPB     120 mg 106 mL/hr over 30 Minutes Intravenous  Once 10/30/13 1037 10/30/13 1615   10/30/13 0500  gentamicin (GARAMYCIN) IVPB 80 mg     80 mg 100 mL/hr over 30 Minutes Intravenous  Once 10/30/13 0047 10/30/13 0526   10/29/13 1600  levofloxacin (LEVAQUIN) IVPB 500 mg  Status:  Discontinued     500 mg 100 mL/hr over 60 Minutes Intravenous Every 48 hours 10/27/13 1457 10/29/13 0857   10/29/13 1000  rifampin (RIFADIN) 300 mg in sodium chloride 0.9 % 100 mL IVPB     300 mg 200 mL/hr over 30 Minutes Intravenous 3 times per day 10/29/13 0858 11/02/13 1520   10/29/13 1000  nafcillin 2 g in dextrose 5 % 50 mL IVPB     2 g 100 mL/hr over  30 Minutes Intravenous 6 times per day 10/29/13 0901     10/29/13 0900  nafcillin injection 2 g  Status:  Discontinued     2 g Intravenous Every 4 hours 10/29/13 0857 10/29/13 0900   10/29/13 0500  vancomycin (VANCOCIN) IVPB 1000 mg/200 mL premix     1,000 mg 200 mL/hr over 60 Minutes Intravenous  Once 10/28/13 2146 10/29/13 0600   10/29/13 0100  cefUROXime (ZINACEF) 1.5 g in dextrose 5 % 50 mL IVPB  Status:  Discontinued     1.5 g 100 mL/hr over 30 Minutes Intravenous Every 12 hours 10/28/13 2146 10/29/13 0857   10/28/13 0400  vancomycin (VANCOCIN) 1,250 mg in sodium chloride 0.9 % 250 mL IVPB     1,250 mg 166.7 mL/hr over 90 Minutes Intravenous To Surgery 10/27/13 1450 10/28/13 0905   10/28/13 0400  cefUROXime (ZINACEF) 1.5 g in dextrose 5 % 50 mL IVPB     1.5 g 100 mL/hr over 30 Minutes Intravenous To Surgery 10/27/13 1450 10/28/13 2030   10/28/13 0400  cefUROXime (ZINACEF) 750 mg in dextrose 5 % 50 mL IVPB  Status:  Discontinued     750 mg 100 mL/hr over 30  Minutes Intravenous To Surgery 10/27/13 1447 10/28/13 2114   10/27/13 1600  levofloxacin (LEVAQUIN) IVPB 750 mg     750 mg 100 mL/hr over 90 Minutes Intravenous  Once 10/27/13 1456 10/28/13 0231   10/23/13 0800  ceFAZolin (ANCEF) IVPB 1 g/50 mL premix  Status:  Discontinued     1 g 100 mL/hr over 30 Minutes Intravenous  Once 10/22/13 2313 10/22/13 2326   10/22/13 2359  ceFAZolin (ANCEF) IVPB 2 g/50 mL premix  Status:  Discontinued     2 g 100 mL/hr over 30 Minutes Intravenous Every M-W-F (Hemodialysis) 10/22/13 2314 10/28/13 2146   10/22/13 2315  ceFAZolin (ANCEF) IVPB 2 g/50 mL premix  Status:  Discontinued     2 g 100 mL/hr over 30 Minutes Intravenous  Once 10/22/13 2313 10/22/13 2326      Medications: Scheduled Meds: . amiodarone  200 mg Oral Daily  . aspirin EC  81 mg Oral Daily  . bisacodyl  10 mg Oral Daily   Or  . bisacodyl  10 mg Rectal Daily  . chlorhexidine  15 mL Mouth Rinse BID  . clonazePAM  0.5 mg Oral  BID  . darbepoetin (ARANESP) injection - DIALYSIS  100 mcg Intravenous Q Tue-HD  . docusate sodium  200 mg Oral Daily  . [START ON 11/07/2013] feeding supplement (NEPRO CARB STEADY)  237 mL Oral BID BM  . ferrous Q000111Q C-folic acid  1 capsule Oral q morning - 10a  . gentamicin  100 mg Intravenous Q M,W,F-1800  . insulin aspart  0-24 Units Subcutaneous TID AC & HS  . levothyroxine  200 mcg Oral QAC breakfast  . midodrine  10 mg Oral BID WC  . mirtazapine  15 mg Oral QHS  . nafcillin IV  2 g Intravenous 6 times per day  . pantoprazole  40 mg Oral Daily  . rifampin  300 mg Oral 3 times per day  . sertraline  50 mg Oral Daily  . sodium chloride  3 mL Intravenous Q12H   Continuous Infusions: . sodium chloride 20 mL/hr at 11/04/13 2146  . DOPamine Stopped (11/05/13 1200)   PRN Meds:.sodium chloride, sodium chloride, albumin human, ALPRAZolam, alteplase, feeding supplement (NEPRO CARB STEADY), fentaNYL, heparin, lidocaine (PF), lidocaine-prilocaine, ondansetron (ZOFRAN) IV, pentafluoroprop-tetrafluoroeth, sodium chloride   Objective: Weight change: -4 lb 13.6 oz (-2.2 kg)  Intake/Output Summary (Last 24 hours) at 11/05/13 2117 Last data filed at 11/05/13 1939  Gross per 24 hour  Intake 1031.95 ml  Output   2500 ml  Net -1468.05 ml   Blood pressure 93/65, pulse 102, temperature 97.6 F (36.4 C), temperature source Oral, resp. rate 25, height 6' 1.5" (1.867 m), weight 212 lb 4.9 oz (96.3 kg), SpO2 94.00%. Temp:  [97.5 F (36.4 C)-98.3 F (36.8 C)] 97.6 F (36.4 C) (04/08 1954) Pulse Rate:  [48-105] 102 (04/08 2030) Resp:  [0-29] 25 (04/08 2030) BP: (82-137)/(46-121) 93/65 mmHg (04/08 2030) SpO2:  [89 %-100 %] 94 % (04/08 2030) Weight:  [212 lb 4.9 oz (96.3 kg)] 212 lb 4.9 oz (96.3 kg) (04/08 0630)  Physical Exam: General: awake alert, extubated and ao  X 3 HEENT: anicteric sclera, , EOMI CVS tachy rate, normal r,   Chest: fairly clear to auscultation  bilaterally, Abdomen: soft nontender, nondistended, normal bowel sounds, Extremities: asymmetric edema left forearm      Skin: sternal wound clean  Neuro: nonfocal  Lab Results:  Recent Labs  11/04/13 0345 11/05/13 0435  WBC 8.6 8.6  HGB  8.8* 8.4*  HCT 25.6* 25.2*  PLT 157 202    BMET  Recent Labs  11/04/13 0345 11/05/13 0435  NA 137 139  K 3.4* 3.6*  CL 96 99  CO2 24 26  GLUCOSE 88 91  BUN 20 16  CREATININE 4.03* 3.45*  CALCIUM 7.5* 7.7*    Micro Results: Recent Results (from the past 240 hour(s))  SURGICAL PCR SCREEN     Status: None   Collection Time    10/27/13  1:13 PM      Result Value Ref Range Status   MRSA, PCR NEGATIVE  NEGATIVE Final   Staphylococcus aureus NEGATIVE  NEGATIVE Final   Comment:            The Xpert SA Assay (FDA     approved for NASAL specimens     in patients over 78 years of age),     is one component of     a comprehensive surveillance     program.  Test performance has     been validated by [employer] for patients greater     than or equal to 46 year old.     It is not intended     to diagnose infection nor to     guide or monitor treatment.  TISSUE CULTURE     Status: None   Collection Time    10/28/13  1:20 PM      Result Value Ref Range Status   Specimen Description TISSUE AORTA   Final   Special Requests PT ON VANCOMYCIN AND ZINACEF   Final   Gram Stain     Final   Value: RARE WBC PRESENT,BOTH PMN AND MONONUCLEAR     NO ORGANISMS SEEN     Performed at [facility]   Culture     Final   Value: NO GROWTH 3 DAYS     Performed at [facility]   Report Status 10/31/2013 FINAL   Final  GRAM STAIN     Status: None   Collection Time    10/28/13  2:26 PM      Result Value Ref Range Status   Specimen Description ABSCESS AORTA   Final   Special Requests PT ON VANCOMYCIN AND ZINACEF   Final   Gram Stain     Final   Value: RARE WBC PRESENT, PREDOMINANTLY PMN     NO ORGANISMS SEEN   Report  Status 10/28/2013 FINAL   Final  ANAEROBIC CULTURE     Status: None   Collection Time    10/28/13  2:26 PM      Result Value Ref Range Status   Specimen Description ABSCESS AORTA   Final   Special Requests PT ON VANCOMYCIN AND ZINACEF   Final   Gram Stain     Final   Value: RARE WBC PRESENT,BOTH PMN AND MONONUCLEAR     NO ORGANISMS SEEN     Performed at [facility]     Final   Value: NO ANAEROBES ISOLATED     Performed at [facility]   Report Status 11/02/2013 FINAL   Final  CULTURE, ROUTINE-ABSCESS     Status: None   Collection Time    10/28/13  2:26 PM      Result Value Ref Range Status   Specimen Description ABSCESS AORTA   Final   Special Requests PT ON VANCOMYCIN AND ZINACEF   Final  Gram Stain     Final   Value: RARE WBC PRESENT, PREDOMINANTLY PMN     NO SQUAMOUS EPITHELIAL CELLS SEEN     NO ORGANISMS SEEN     Performed at [facility]     Performed at [facility]   Culture     Final   Value: NO GROWTH 3 DAYS     Performed at [facility]   Report Status 10/31/2013 FINAL   Final  CLOSTRIDIUM DIFFICILE BY PCR     Status: None   Collection Time    11/02/13 10:04 AM      Result Value Ref Range Status   C difficile by pcr NEGATIVE  NEGATIVE Final  URINE CULTURE     Status: None   Collection Time    11/04/13  9:45 AM      Result Value Ref Range Status   Specimen Description URINE, CLEAN CATCH   Final   Special Requests Nafcillin, Rifampin, Gent Normal   Final   Culture  Setup Time     Final   Value: 11/04/2013 10:58     Performed at [place]     Final   Value: NO GROWTH     Performed at [facility]   Culture     Final   Value: NO GROWTH     Performed at [facility]   Report Status 11/05/2013 FINAL   Final    Studies/Results: Dg Chest Port 1 View  11/04/2013   CLINICAL DATA:  Check tube position.  EXAM: PORTABLE CHEST - 1 VIEW  COMPARISON:  DG CHEST 1V PORT dated  11/03/2013; DG CHEST 1V PORT dated 11/02/2013  FINDINGS: Left IJ tube in stable position projected over the left upper mediastinum, dialysis catheter noted with tip projected in the right atrium. Stable severe cardiomegaly. Prior median sternotomy. Left lower lobe atelectasis and/or infiltrate with left pleural effusion again noted and unchanged. No pneumothorax.  IMPRESSION: 1. Line and tube positions unchanged. The left IJ tube is projected over the left upper mediastinum as noted on prior study. 2. Stable severe cardiomegaly.  Prior CABG. 3. Persistent left pleural of atelectasis and/or infiltrate with stable left pleural effusion. No change.   Electronically Signed   By: Marcello Moores  Register   On: 11/04/2013 08:02      Assessment/Plan: Bradley [name] is a 58 y.o. 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 WE would use for only 2 weeks and would use as low a dose as possible   With re to Naf vs Ancef we could  repeat MRI of the brain if we wanted to go back to ancef (since CNS penetration is lousy with this) though at present with temp PM in not a good idea, can wait for later, if any acute worsening Neuro signs would get CT with contrast . He has had fleeting black spots in vision  but was having these before and has had less frequently than when he was at home prior to admission  #2 Left forearm edema: I consulted Dr. Oneida Alar from [facility] to see the pateint  and he has evaluated and ordered doppler to which was negative.          LOS: 14 days   Truman Hayward 11/05/2013, 9:17 PM

## 2013-11-05 NOTE — Progress Notes (Addendum)
[address]       [place] [address]             [phone]      8 Days Post-Op Procedure(s) (LRB): REDO STERNOTOMY (N/A) REDO BENTALL PROCEDURE, debridment of aoritc root abscess, replacement of aortic root, ascending aorta and aortic valve with homograft. Insertion of left femoral arterial line (N/A) INTRAOPERATIVE TRANSESOPHAGEAL ECHOCARDIOGRAM (N/A)  Subjective:  [name] states he is feeling better this morning.  He has had some further nausea but Zofran provides relief.  Per nursing patient had panic attack last night and is having difficulty sleeping, however he did sleep during the day yesterday after dialysis.  He is a full assist to stand and is ambulating minimal distances in his room.  He continues to have multiple bowel movements however, these have become more formed.  Objective: Vital signs in last 24 hours: Temp:  [97.4 F (36.3 C)-98.3 F (36.8 C)] 98.3 F (36.8 C) (04/08 0805) Pulse Rate:  [76-112] 88 (04/08 0700) Cardiac Rhythm:  [-] Normal sinus rhythm (04/08 0600) Resp:  [0-37] 22 (04/08 0700) BP: (75-110)/(46-81) 82/63 mmHg (04/08 0600) SpO2:  [89 %-100 %] 92 % (04/08 0700) Weight:  [212 lb 4.9 oz (96.3 kg)] 212 lb 4.9 oz (96.3 kg) (04/08 0630)  Intake/Output from previous day: 04/07 0701 - 04/08 0700 In: 1409.4 [P.O.:460; I.V.:449.4; IV Piggyback:500] Out: 2625 [Urine:225]  General appearance: alert, cooperative and no distress Heart: regular rate and rhythm Lungs: clear to auscultation bilaterally Abdomen: soft, non-tender; bowel sounds normal; no masses,  no organomegaly Extremities: edema 1+ pitting Wound: clean and dry  Lab Results:  Recent Labs  11/04/13 0345 11/05/13 0435  WBC 8.6 8.6  HGB 8.8* 8.4*  HCT 25.6* 25.2*  PLT 157 202   BMET:  Recent Labs  11/04/13 0345 11/05/13 0435  NA 137 139  K 3.4* 3.6*  CL 96 99  CO2 24 26  GLUCOSE 88 91  BUN 20 16  CREATININE 4.03* 3.45*  CALCIUM 7.5* 7.7*      PT/INR: No results found for this basename: LABPROT, INR,  in the last 72 hours ABG    Component Value Date/Time   PHART 7.515* 10/31/2013 1342   HCO3 25.6* 10/31/2013 1342   TCO2 27 10/31/2013 1342   ACIDBASEDEF 1.0 10/29/2013 1536   O2SAT 99.0 10/31/2013 1342   CBG (last 3)   Recent Labs  11/04/13 1634 11/04/13 2149 11/05/13 0809  GLUCAP 125* 87 101*    Assessment/Plan: S/P Procedure(s) (LRB): REDO STERNOTOMY (N/A) REDO BENTALL PROCEDURE, debridment of aoritc root abscess, replacement of aortic root, ascending aorta and aortic valve with homograft. Insertion of left femoral arterial line (N/A) INTRAOPERATIVE TRANSESOPHAGEAL ECHOCARDIOGRAM (N/A)  1. CV- NSR, hypotension- has been started on Midodrine which is helping, Dopamine has been weaned to 1 mcg, remains on Amiodarone 2. Pulm- no acute issues, continue IS 3. ESRD, Nephrology following, had HD past 2 days 4. GI- nausea, continue zofran 5. GU- dysuria improving, UA is unremarkable and per ID would not recommend treating urine due to likely asymptomatic colonization 6. Psych- continue Zoloft, Clonopin and Remeron, will add Xanax prn for anxiety 7. Dispo- continues to progress, will wean Dopamine as tolerated, Midodrine started yesterday  LOS: 14 days    Erin Barrett 11/05/2013  Continues to improve Weaning dopamine off as tolerated, Walking better today Consider inpatient rehab as improves I have seen and examined [name] and agree with the above  assessment  and plan.  [name] MD Beeper [phone] Office [phone] 11/05/2013 8:53 AM

## 2013-11-05 NOTE — Progress Notes (Signed)
Rehab admissions - Evaluated for possible admission.  I met with patient and gave him rehab brochures.  I answered questions about rehab process.  Depending on his progress, may need a short inpatient rehab stay.  I will follow along.  Call me for questions.  #[phone]

## 2013-11-06 ENCOUNTER — Inpatient Hospital Stay (HOSPITAL_COMMUNITY): Payer: Medicaid Other

## 2013-11-06 LAB — COMPREHENSIVE METABOLIC PANEL
ALT: 17 U/L (ref 0–53)
AST: 33 U/L (ref 0–37)
Albumin: 1.6 g/dL — ABNORMAL LOW (ref 3.5–5.2)
Alkaline Phosphatase: 76 U/L (ref 39–117)
BUN: 12 mg/dL (ref 6–23)
CO2: 27 mEq/L (ref 19–32)
Calcium: 7.8 mg/dL — ABNORMAL LOW (ref 8.4–10.5)
Chloride: 97 mEq/L (ref 96–112)
Creatinine, Ser: 3.14 mg/dL — ABNORMAL HIGH (ref 0.50–1.35)
GFR calc Af Amer: 24 mL/min — ABNORMAL LOW (ref >90)
GFR calc non Af Amer: 21 mL/min — ABNORMAL LOW (ref >90)
Glucose, Bld: 87 mg/dL (ref 70–99)
Potassium: 3.7 mEq/L (ref 3.7–5.3)
Sodium: 139 mEq/L (ref 137–147)
Total Bilirubin: 0.6 mg/dL (ref 0.3–1.2)
Total Protein: 5.1 g/dL — ABNORMAL LOW (ref 6.0–8.3)

## 2013-11-06 LAB — CBC
HCT: 23.9 % — ABNORMAL LOW (ref 39.0–52.0)
Hemoglobin: 8.1 g/dL — ABNORMAL LOW (ref 13.0–17.0)
MCH: 28.5 pg (ref 26.0–34.0)
MCHC: 33.9 g/dL (ref 30.0–36.0)
MCV: 84.2 fL (ref 78.0–100.0)
Platelets: 216 10*3/uL (ref 150–400)
RBC: 2.84 MIL/uL — ABNORMAL LOW (ref 4.22–5.81)
RDW: 20.4 % — ABNORMAL HIGH (ref 11.5–15.5)
WBC: 8.8 10*3/uL (ref 4.0–10.5)

## 2013-11-06 LAB — GLUCOSE, CAPILLARY
Glucose-Capillary: 92 mg/dL (ref 70–99)
Glucose-Capillary: 100 mg/dL — ABNORMAL HIGH (ref 70–99)
Glucose-Capillary: 86 mg/dL (ref 70–99)
Glucose-Capillary: 150 mg/dL — ABNORMAL HIGH (ref 70–99)
Glucose-Capillary: 96 mg/dL (ref 70–99)

## 2013-11-06 LAB — HEPATITIS B SURFACE ANTIGEN: Hepatitis B Surface Ag: NEGATIVE

## 2013-11-06 LAB — GENTAMICIN LEVEL, RANDOM: Gentamicin Rm: 3.7 ug/mL

## 2013-11-06 MED ORDER — DOCUSATE SODIUM 100 MG PO CAPS
200.0000 mg | ORAL_CAPSULE | Freq: Every day | ORAL | Status: DC
  Filled 2013-11-06 (×6): qty 2

## 2013-11-06 MED ORDER — SODIUM CHLORIDE 0.9 % IV SOLN: 250.0000 mL | INTRAVENOUS | Status: DC | PRN

## 2013-11-06 MED ORDER — BISACODYL 5 MG PO TBEC
10.0000 mg | DELAYED_RELEASE_TABLET | Freq: Every day | ORAL | Status: DC | PRN
  Filled 2013-11-06: qty 2

## 2013-11-06 MED ORDER — SODIUM CHLORIDE 0.9 % IJ SOLN: 3.0000 mL | INTRAMUSCULAR | Status: DC | PRN

## 2013-11-06 MED ORDER — INSULIN ASPART 100 UNIT/ML ~~LOC~~ SOLN
0.0000 [IU] | Freq: Three times a day (TID) | SUBCUTANEOUS | Status: DC
  Administered 2013-11-06: 2 [IU] via SUBCUTANEOUS

## 2013-11-06 MED ORDER — ASPIRIN EC 81 MG PO TBEC
81.0000 mg | DELAYED_RELEASE_TABLET | Freq: Every day | ORAL | Status: DC
  Administered 2013-11-07: 81 mg via ORAL
  Filled 2013-11-06 (×2): qty 1

## 2013-11-06 MED ORDER — ALBUMIN HUMAN 25 % IV SOLN
INTRAVENOUS | Status: AC
  Filled 2013-11-06: qty 200

## 2013-11-06 MED ORDER — MOVING RIGHT ALONG BOOK
Freq: Once | Status: AC
  Administered 2013-11-06: 18:00:00
  Filled 2013-11-06: qty 1

## 2013-11-06 MED ORDER — SODIUM CHLORIDE 0.9 % IJ SOLN
3.0000 mL | Freq: Two times a day (BID) | INTRAMUSCULAR | Status: DC
  Administered 2013-11-08 (×2): 3 mL via INTRAVENOUS

## 2013-11-06 MED ORDER — ONDANSETRON HCL 4 MG/2ML IJ SOLN
4.0000 mg | Freq: Four times a day (QID) | INTRAMUSCULAR | Status: DC | PRN
  Filled 2013-11-06: qty 2

## 2013-11-06 MED ORDER — ONDANSETRON HCL 4 MG PO TABS
4.0000 mg | ORAL_TABLET | Freq: Four times a day (QID) | ORAL | Status: DC | PRN
  Administered 2013-11-07 – 2013-11-13 (×5): 4 mg via ORAL
  Filled 2013-11-06 (×5): qty 1

## 2013-11-06 MED ORDER — PANTOPRAZOLE SODIUM 40 MG PO TBEC
40.0000 mg | DELAYED_RELEASE_TABLET | Freq: Every day | ORAL | Status: DC
  Administered 2013-11-07 – 2013-11-13 (×7): 40 mg via ORAL
  Filled 2013-11-06 (×7): qty 1

## 2013-11-06 MED ORDER — BISACODYL 10 MG RE SUPP: 10.0000 mg | Freq: Every day | RECTAL | Status: DC | PRN

## 2013-11-06 MED ORDER — NAFCILLIN SODIUM 2 G IJ SOLR
2.0000 g | INTRAVENOUS | Status: DC
  Administered 2013-11-06 – 2013-11-10 (×24): 2 g via INTRAVENOUS
  Filled 2013-11-06 (×34): qty 2000

## 2013-11-06 NOTE — Progress Notes (Signed)
[place] [facility]   Day # 9  nafcillin Day # 9rifampin Day # 7 gentamicin   Subjective: Still seeing "black spots" wants to proceed with CT of brain to re-evalute septic emboli  Antibiotics:  Anti-infectives   Start     Dose/Rate Route Frequency Ordered Stop   11/06/13 1400  nafcillin 2 g in dextrose 5 % 50 mL IVPB     2 g 100 mL/hr over 30 Minutes Intravenous 6 times per day 11/06/13 1347     11/05/13 1800  gentamicin (GARAMYCIN) IVPB 100 mg     100 mg 200 mL/hr over 30 Minutes Intravenous Every M-W-F (1800) 11/04/13 1317 11/12/13 2359   11/04/13 1300  gentamicin (GARAMYCIN) IVPB 100 mg     100 mg 200 mL/hr over 30 Minutes Intravenous  Once 11/04/13 1146 11/04/13 1320   11/03/13 1200  gentamicin (GARAMYCIN) IVPB 100 mg  Status:  Discontinued     100 mg 200 mL/hr over 30 Minutes Intravenous Every M-W-F (Hemodialysis) 11/01/13 1429 11/04/13 1317   11/02/13 2200  rifampin (RIFADIN) capsule 300 mg     300 mg Oral 3 times per day 11/02/13 1426     11/01/13 1200  gentamicin (GARAMYCIN) IVPB 100 mg  Status:  Discontinued     100 mg 200 mL/hr over 30 Minutes Intravenous Every T-Th-Sa (Hemodialysis) 10/30/13 1037 11/01/13 1429   10/31/13 1200  gentamicin (GARAMYCIN) IVPB 80 mg  Status:  Discontinued     80 mg 100 mL/hr over 30 Minutes Intravenous Every Dialysis 10/30/13 0047 10/30/13 1027   10/30/13 1300  gentamicin (GARAMYCIN) 120 mg in dextrose 5 % 50 mL IVPB     120 mg 106 mL/hr over 30 Minutes Intravenous  Once 10/30/13 1037 10/30/13 1615   10/30/13 0500  gentamicin (GARAMYCIN) IVPB 80 mg     80 mg 100 mL/hr over 30 Minutes Intravenous  Once 10/30/13 0047 10/30/13 0526   10/29/13 1600  levofloxacin (LEVAQUIN) IVPB 500 mg  Status:  Discontinued     500 mg 100 mL/hr over 60 Minutes Intravenous Every 48 hours 10/27/13 1457 10/29/13 0857   10/29/13 1000  rifampin (RIFADIN) 300 mg in sodium chloride 0.9 % 100 mL IVPB     300 mg 200 mL/hr over 30  Minutes Intravenous 3 times per day 10/29/13 0858 11/02/13 1520   10/29/13 1000  nafcillin 2 g in dextrose 5 % 50 mL IVPB  Status:  Discontinued     2 g 100 mL/hr over 30 Minutes Intravenous 6 times per day 10/29/13 0901 11/06/13 1347   10/29/13 0900  nafcillin injection 2 g  Status:  Discontinued     2 g Intravenous Every 4 hours 10/29/13 0857 10/29/13 0900   10/29/13 0500  vancomycin (VANCOCIN) IVPB 1000 mg/200 mL premix     1,000 mg 200 mL/hr over 60 Minutes Intravenous  Once 10/28/13 2146 10/29/13 0600   10/29/13 0100  cefUROXime (ZINACEF) 1.5 g in dextrose 5 % 50 mL IVPB  Status:  Discontinued     1.5 g 100 mL/hr over 30 Minutes Intravenous Every 12 hours 10/28/13 2146 10/29/13 0857   10/28/13 0400  vancomycin (VANCOCIN) 1,250 mg in sodium chloride 0.9 % 250 mL IVPB     1,250 mg 166.7 mL/hr over 90 Minutes Intravenous To Surgery 10/27/13 1450 10/28/13 0905   10/28/13 0400  cefUROXime (ZINACEF) 1.5 g in dextrose 5 % 50 mL IVPB     1.5 g 100 mL/hr over  30 Minutes Intravenous To Surgery 10/27/13 1450 10/28/13 2030   10/28/13 0400  cefUROXime (ZINACEF) 750 mg in dextrose 5 % 50 mL IVPB  Status:  Discontinued     750 mg 100 mL/hr over 30 Minutes Intravenous To Surgery 10/27/13 1447 10/28/13 2114   10/27/13 1600  levofloxacin (LEVAQUIN) IVPB 750 mg     750 mg 100 mL/hr over 90 Minutes Intravenous  Once 10/27/13 1456 10/28/13 0231   10/23/13 0800  ceFAZolin (ANCEF) IVPB 1 g/50 mL premix  Status:  Discontinued     1 g 100 mL/hr over 30 Minutes Intravenous  Once 10/22/13 2313 10/22/13 2326   10/22/13 2359  ceFAZolin (ANCEF) IVPB 2 g/50 mL premix  Status:  Discontinued     2 g 100 mL/hr over 30 Minutes Intravenous Every M-W-F (Hemodialysis) 10/22/13 2314 10/28/13 2146   10/22/13 2315  ceFAZolin (ANCEF) IVPB 2 g/50 mL premix  Status:  Discontinued     2 g 100 mL/hr over 30 Minutes Intravenous  Once 10/22/13 2313 10/22/13 2326      Medications: Scheduled Meds: . albumin human      .  amiodarone  200 mg Oral Daily  . aspirin EC  81 mg Oral Daily  . aspirin EC  81 mg Oral Daily  . chlorhexidine  15 mL Mouth Rinse BID  . clonazePAM  0.5 mg Oral BID  . darbepoetin (ARANESP) injection - DIALYSIS  100 mcg Intravenous Q Tue-HD  . docusate sodium  200 mg Oral Daily  . [START ON 11/07/2013] feeding supplement (NEPRO CARB STEADY)  237 mL Oral BID BM  . ferrous Q000111Q C-folic acid  1 capsule Oral q morning - 10a  . gentamicin  100 mg Intravenous Q M,W,F-1800  . insulin aspart  0-24 Units Subcutaneous TID AC & HS  . levothyroxine  200 mcg Oral QAC breakfast  . midodrine  10 mg Oral BID WC  . mirtazapine  15 mg Oral QHS  . moving right along book   Does not apply Once  . nafcillin IV  2 g Intravenous 6 times per day  . [START ON 11/07/2013] pantoprazole  40 mg Oral QAC breakfast  . rifampin  300 mg Oral 3 times per day  . sertraline  50 mg Oral Daily  . sodium chloride  3 mL Intravenous Q12H   Continuous Infusions: . sodium chloride 20 mL/hr at 11/04/13 2146   PRN Meds:.sodium chloride, sodium chloride, sodium chloride, albumin human, ALPRAZolam, bisacodyl, bisacodyl, feeding supplement (NEPRO CARB STEADY), heparin, lidocaine (PF), lidocaine-prilocaine, ondansetron (ZOFRAN) IV, ondansetron, pentafluoroprop-tetrafluoroeth, sodium chloride   Objective: Weight change: -7 lb 7.9 oz (-3.4 kg)  Intake/Output Summary (Last 24 hours) at 11/06/13 1704 Last data filed at 11/06/13 1432  Gross per 24 hour  Intake    750 ml  Output   5002 ml  Net  -4252 ml   Blood pressure 100/74, pulse 97, temperature 97.6 F (36.4 C), temperature source Oral, resp. rate 20, height 6' 1.5" (1.867 m), weight 200 lb 13.4 oz (91.1 kg), SpO2 90.00%. Temp:  [97.6 F (36.4 C)-98.9 F (37.2 C)] 97.6 F (36.4 C) (04/09 1500) Pulse Rate:  [48-112] 97 (04/09 1500) Resp:  [12-34] 20 (04/09 1500) BP: (85-116)/(51-82) 100/74 mmHg (04/09 1500) SpO2:  [90 %-100 %] 90 % (04/09 1500) Weight:   [200 lb 13.4 oz (91.1 kg)-207 lb 10.8 oz (94.2 kg)] 200 lb 13.4 oz (91.1 kg) (04/09 1406)  Physical Exam: General: awake alert, extubated and ao  X 3  HEENT: anicteric sclera, , EOMI CVS tachy rate, normal r,   Chest: fairly clear to auscultation bilaterally, Abdomen: soft nontender, nondistended, normal bowel sounds, Extremities: asymmetric edema left forearm  Skin: sternal wound clean  Neuro: nonfocal  Lab Results:  Recent Labs  11/05/13 0435 11/06/13 0350  WBC 8.6 8.8  HGB 8.4* 8.1*  HCT 25.2* 23.9*  PLT 202 216    BMET  Recent Labs  11/05/13 0435 11/06/13 0350  NA 139 139  K 3.6* 3.7  CL 99 97  CO2 26 27  GLUCOSE 91 87  BUN 16 12  CREATININE 3.45* 3.14*  CALCIUM 7.7* 7.8*    Micro Results: Recent Results (from the past 240 hour(s))  TISSUE CULTURE     Status: None   Collection Time    10/28/13  1:20 PM      Result Value Ref Range Status   Specimen Description TISSUE AORTA   Final   Special Requests PT ON VANCOMYCIN AND ZINACEF   Final   Gram Stain     Final   Value: RARE WBC PRESENT,BOTH PMN AND MONONUCLEAR     NO ORGANISMS SEEN     Performed at [facility]   Culture     Final   Value: NO GROWTH 3 DAYS     Performed at [facility]   Report Status 10/31/2013 FINAL   Final  GRAM STAIN     Status: None   Collection Time    10/28/13  2:26 PM      Result Value Ref Range Status   Specimen Description ABSCESS AORTA   Final   Special Requests PT ON VANCOMYCIN AND ZINACEF   Final   Gram Stain     Final   Value: RARE WBC PRESENT, PREDOMINANTLY PMN     NO ORGANISMS SEEN   Report Status 10/28/2013 FINAL   Final  ANAEROBIC CULTURE     Status: None   Collection Time    10/28/13  2:26 PM      Result Value Ref Range Status   Specimen Description ABSCESS AORTA   Final   Special Requests PT ON VANCOMYCIN AND ZINACEF   Final   Gram Stain     Final   Value: RARE WBC PRESENT,BOTH PMN AND MONONUCLEAR     NO ORGANISMS SEEN     Performed at  [facility]   Culture     Final   Value: NO ANAEROBES ISOLATED     Performed at [facility]   Report Status 11/02/2013 FINAL   Final  CULTURE, ROUTINE-ABSCESS     Status: None   Collection Time    10/28/13  2:26 PM      Result Value Ref Range Status   Specimen Description ABSCESS AORTA   Final   Special Requests PT ON VANCOMYCIN AND ZINACEF   Final   Gram Stain     Final   Value: RARE WBC PRESENT, PREDOMINANTLY PMN     NO SQUAMOUS EPITHELIAL CELLS SEEN     NO ORGANISMS SEEN     Performed at [facility]     Performed at [facility]   Culture     Final   Value: NO GROWTH 3 DAYS     Performed at [facility]   Report Status 10/31/2013 FINAL   Final  CLOSTRIDIUM DIFFICILE BY PCR     Status: None   Collection Time    11/02/13 10:04 AM  Result Value Ref Range Status   C difficile by pcr NEGATIVE  NEGATIVE Final  URINE CULTURE     Status: None   Collection Time    11/04/13  9:45 AM      Result Value Ref Range Status   Specimen Description URINE, CLEAN CATCH   Final   Special Requests Nafcillin, Rifampin, Gent Normal   Final   Culture  Setup Time     Final   Value: 11/04/2013 10:58     Performed at [place] [facility]     Final   Value: NO GROWTH     Performed at [facility]   Culture     Final   Value: NO GROWTH     Performed at [facility]   Report Status 11/05/2013 FINAL   Final    Studies/Results: Dg Chest Port 1 View  11/06/2013   CLINICAL DATA:  Postop, chest tube check  EXAM: PORTABLE CHEST - 1 VIEW  COMPARISON:  DG CHEST 1V PORT dated 11/04/2013; DG CHEST 1V PORT dated 11/03/2013; DG CHEST 1V PORT dated 11/02/2013; DG CHEST 1V PORT dated 11/01/2013; CT HEART MORPH/PULM VEIN W/CM&W/O CA SCORE dated 10/26/2013; DG CHEST 1V PORT dated 4/3/2  FINDINGS: Grossly unchanged enlarged cardiac silhouette and mediastinal contours post median sternotomy. Stable positioning of support apparatus including left-sided  catheter overlying the lateral aspect of the aortic arch, likely within the aortic nipple (left superior intercostal vein). Otherwise, stable position of support apparatus. Minimally improved aeration of the lungs with increase conspicuity of the pulmonary vasculature. Grossly unchanged small left-sided effusion associated left basilar heterogeneous / consolidative opacities. No new focal airspace opacities. Grossly unchanged bones.  IMPRESSION: 1. Stable positioning of support apparatus including left-sided catheter overlying the expected location of the aortic nipple (left superior intercostal vein). Nose, this has been reported on multiple prior examinations. Again, repositioning is advised. 2. Findings suggestive of improved pulmonary edema and atelectasis. 3. Unchanged small left-sided effusion and left basilar/retrocardiac opacities, likely atelectasis   Electronically Signed   By: Sandi Mariscal M.D.   On: 11/06/2013 07:44      Assessment/Plan: Bradley [name] is a 58 y.o. 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  Patient wants to proceed with CT brain WITH contrast  I discussed with Dr Jonnie Finner from Nephrology and  He was fine with this as long as coordinated with planned HD session for tomorrow   #2 Seeing black spots" as above will re-evaluate CNS with CT with contrast since we cannot use MRI brain with PM in place          LOS: 15  days   Truman Hayward 11/06/2013, 5:04 PM

## 2013-11-06 NOTE — Progress Notes (Signed)
Report called to [name] RN. Pt still receiving HD treatment. Pt to transfer to 2W-24 after treatment. Will cont to monitor. Bradley [name]

## 2013-11-06 NOTE — Progress Notes (Signed)
Patient ID: Bradley [name], male   DOB: [birth year], 57 y.o.   MRN: [id number] [facility] DAILY ICU PROGRESS NOTE                   [address]            [place] [address]          [phone]   9 Days Post-Op Procedure(s) (LRB): REDO STERNOTOMY (N/A) REDO BENTALL PROCEDURE, debridment of aoritc root abscess, replacement of aortic root, ascending aorta and aortic valve with homograft. Insertion of left femoral arterial line (N/A) INTRAOPERATIVE TRANSESOPHAGEAL ECHOCARDIOGRAM (N/A)  Total Length of Stay:  LOS: 15 days   Subjective: Walked in unit this am, still complains of trouble sleeping   Objective: Vital signs in last 24 hours: Temp:  [97.6 F (36.4 C)-98.9 F (37.2 C)] 98.2 F (36.8 C) (04/09 0406) Pulse Rate:  [48-105] 93 (04/09 0700) Cardiac Rhythm:  [-] Normal sinus rhythm (04/09 0700) Resp:  [0-30] 21 (04/09 0700) BP: (85-137)/(51-121) 112/80 mmHg (04/09 0630) SpO2:  [90 %-100 %] 98 % (04/09 0700) Weight:  [207 lb 10.8 oz (94.2 kg)] 207 lb 10.8 oz (94.2 kg) (04/09 0500)  Filed Weights   11/04/13 0800 11/05/13 0630 11/06/13 0500  Weight: 215 lb 2.7 oz (97.6 kg) 212 lb 4.9 oz (96.3 kg) 207 lb 10.8 oz (94.2 kg)    Weight change: -7 lb 7.9 oz (-3.4 kg)   Hemodynamic parameters for last 24 hours:    Intake/Output from previous day: 04/08 0701 - 04/09 0700 In: 1044.5 [P.O.:400; I.V.:244.5; IV Piggyback:400] Out: 2502 [Urine:1; Stool:1]  Intake/Output this shift:    Current Meds: Scheduled Meds: . amiodarone  200 mg Oral Daily  . aspirin EC  81 mg Oral Daily  . bisacodyl  10 mg Oral Daily   Or  . bisacodyl  10 mg Rectal Daily  . chlorhexidine  15 mL Mouth Rinse BID  . clonazePAM  0.5 mg Oral BID  . darbepoetin (ARANESP) injection - DIALYSIS  100 mcg Intravenous Q Tue-HD  . docusate sodium  200 mg Oral Daily  . [START ON 11/07/2013] feeding supplement (NEPRO CARB STEADY)  237 mL Oral BID BM  . ferrous Q000111Q C-folic acid  1  capsule Oral q morning - 10a  . gentamicin  100 mg Intravenous Q M,W,F-1800  . insulin aspart  0-24 Units Subcutaneous TID AC & HS  . levothyroxine  200 mcg Oral QAC breakfast  . midodrine  10 mg Oral BID WC  . mirtazapine  15 mg Oral QHS  . nafcillin IV  2 g Intravenous 6 times per day  . pantoprazole  40 mg Oral Daily  . rifampin  300 mg Oral 3 times per day  . sertraline  50 mg Oral Daily  . sodium chloride  3 mL Intravenous Q12H   Continuous Infusions: . sodium chloride 20 mL/hr at 11/04/13 2146  . DOPamine Stopped (11/05/13 1200)   PRN Meds:.sodium chloride, sodium chloride, albumin human, ALPRAZolam, feeding supplement (NEPRO CARB STEADY), fentaNYL, heparin, lidocaine (PF), lidocaine-prilocaine, ondansetron (ZOFRAN) IV, pentafluoroprop-tetrafluoroeth, sodium chloride  General appearance: alert and cooperative Neurologic: intact Heart: regular rate and rhythm, S1, S2 normal, no murmur, click, rub or gallop Lungs: diminished breath sounds bibasilar Abdomen: soft, non-tender; bowel sounds normal; no masses,  no organomegaly Extremities: extremities normal, atraumatic, no cyanosis or edema and Homans sign is negative, no sign of DVT Wound: sternum stable, some swelling left arm but improved  Lab Results:  CBC: Recent Labs  11/05/13 0435 11/06/13 0350  WBC 8.6 8.8  HGB 8.4* 8.1*  HCT 25.2* 23.9*  PLT 202 216   BMET:  Recent Labs  11/05/13 0435 11/06/13 0350  NA 139 139  K 3.6* 3.7  CL 99 97  CO2 26 27  GLUCOSE 91 87  BUN 16 12  CREATININE 3.45* 3.14*  CALCIUM 7.7* 7.8*    PT/INR: No results found for this basename: LABPROT, INR,  in the last 72 hours Radiology: Dg Chest Port 1 View  11/06/2013   CLINICAL DATA:  Postop, chest tube check  EXAM: PORTABLE CHEST - 1 VIEW  COMPARISON:  DG CHEST 1V PORT dated 11/04/2013; DG CHEST 1V PORT dated 11/03/2013; DG CHEST 1V PORT dated 11/02/2013; DG CHEST 1V PORT dated 11/01/2013; CT HEART MORPH/PULM VEIN W/CM&W/O CA SCORE dated  10/26/2013; DG CHEST 1V PORT dated 4/3/2  FINDINGS: Grossly unchanged enlarged cardiac silhouette and mediastinal contours post median sternotomy. Stable positioning of support apparatus including left-sided catheter overlying the lateral aspect of the aortic arch, likely within the aortic nipple (left superior intercostal vein). Otherwise, stable position of support apparatus. Minimally improved aeration of the lungs with increase conspicuity of the pulmonary vasculature. Grossly unchanged small left-sided effusion associated left basilar heterogeneous / consolidative opacities. No new focal airspace opacities. Grossly unchanged bones.  IMPRESSION: 1. Stable positioning of support apparatus including left-sided catheter overlying the expected location of the aortic nipple (left superior intercostal vein). Nose, this has been reported on multiple prior examinations. Again, repositioning is advised. 2. Findings suggestive of improved pulmonary edema and atelectasis. 3. Unchanged small left-sided effusion and left basilar/retrocardiac opacities, likely atelectasis   Electronically Signed   By: Sandi Mariscal M.D.   On: 11/06/2013 07:44     Assessment/Plan: S/P Procedure(s) (LRB): REDO STERNOTOMY (N/A) REDO BENTALL PROCEDURE, debridment of aoritc root abscess, replacement of aortic root, ascending aorta and aortic valve with homograft. Insertion of left femoral arterial line (N/A) INTRAOPERATIVE TRANSESOPHAGEAL ECHOCARDIOGRAM (N/A) Mobilize Plan for transfer to step-down: see transfer orders Consider inpatient rehab soon     [name] 11/06/2013 7:51 AM

## 2013-11-06 NOTE — Progress Notes (Signed)
Pt transferred to 2W-24 via wheelchair. Belongings at side, VS stable at time of transfer. Meds in chart. Bedside handoff to Windy Canny RN. No questions or concerns at this time.  Thelma Comp

## 2013-11-06 NOTE — Progress Notes (Signed)
ANTIBIOTIC CONSULT NOTE - FOLLOW UP  Pharmacy Consult for gentamicin Indication: MSSA bacteremia/endocarditis  Allergies  Allergen Reactions  . Oxycodone     Gives patient nightmares  . Rifampin Nausea Only   Patient Measurements: Height: 6' 1.5" (186.7 cm) Weight: 200 lb 13.4 oz (91.1 kg) IBW/kg (Calculated) : 81.05  Vital Signs: Temp: 97.6 F (36.4 C) (04/09 1500) Temp src: Oral (04/09 1500) BP: 100/74 mmHg (04/09 1500) Pulse Rate: 97 (04/09 1500) Intake/Output from previous day: 04/08 0701 - 04/09 0700 In: 1044.5 [P.O.:400; I.V.:244.5; IV Piggyback:400] Out: 2502 [Urine:1; Stool:1] Intake/Output from this shift: Total I/O In: 300 [P.O.:120; I.V.:80; IV Piggyback:100] Out: 2500 [Other:2500]  Labs:  Recent Labs  11/04/13 0345 11/05/13 0435 11/06/13 0350  WBC 8.6 8.6 8.8  HGB 8.8* 8.4* 8.1*  PLT 157 202 216  CREATININE 4.03* 3.45* 3.14*   Estimated Creatinine Clearance: 30.1 ml/min (by C-G formula based on Cr of 3.14).  Recent Labs  11/06/13 0350  GENTRANDOM 3.7    Microbiology: Recent Results (from the past 720 hour(s))  CLOSTRIDIUM DIFFICILE BY PCR     Status: None   Collection Time    10/22/13 10:04 PM      Result Value Ref Range Status   C difficile by pcr NEGATIVE  NEGATIVE Final  CULTURE, BLOOD (ROUTINE X 2)     Status: None   Collection Time    10/22/13 10:26 PM      Result Value Ref Range Status   Specimen Description BLOOD RIGHT ARM   Final   Special Requests BOTTLES DRAWN AEROBIC ONLY 3CC   Final   Culture  Setup Time     Final   Value: 10/23/2013 04:42     Performed at [facility]   Culture     Final   Value: NO GROWTH 5 DAYS     Performed at [facility]   Report Status 10/29/2013 FINAL   Final  CULTURE, BLOOD (ROUTINE X 2)     Status: None   Collection Time    10/22/13 10:34 PM      Result Value Ref Range Status   Specimen Description BLOOD RIGHT HAND   Final   Special Requests BOTTLES DRAWN AEROBIC ONLY [facility]    Final   Culture  Setup Time     Final   Value: 10/23/2013 04:42     Performed at [facility]   Culture     Final   Value: NO GROWTH 5 DAYS     Performed at [facility]   Report Status 10/29/2013 FINAL   Final  SURGICAL PCR SCREEN     Status: None   Collection Time    10/27/13  1:13 PM      Result Value Ref Range Status   MRSA, PCR NEGATIVE  NEGATIVE Final   Staphylococcus aureus NEGATIVE  NEGATIVE Final   Comment:            The Xpert SA Assay (FDA     approved for NASAL specimens     in patients over 35 years of age),     is one component of     a comprehensive surveillance     program.  Test performance has     been validated by [employer] for patients greater     than or equal to 55 year old.     It is not intended     to diagnose infection nor to  guide or monitor treatment.  TISSUE CULTURE     Status: None   Collection Time    10/28/13  1:20 PM      Result Value Ref Range Status   Specimen Description TISSUE AORTA   Final   Special Requests PT ON VANCOMYCIN AND ZINACEF   Final   Gram Stain     Final   Value: RARE WBC PRESENT,BOTH PMN AND MONONUCLEAR     NO ORGANISMS SEEN     Performed at [facility]   Culture     Final   Value: NO GROWTH 3 DAYS     Performed at [facility]   Report Status 10/31/2013 FINAL   Final  GRAM STAIN     Status: None   Collection Time    10/28/13  2:26 PM      Result Value Ref Range Status   Specimen Description ABSCESS AORTA   Final   Special Requests PT ON VANCOMYCIN AND ZINACEF   Final   Gram Stain     Final   Value: RARE WBC PRESENT, PREDOMINANTLY PMN     NO ORGANISMS SEEN   Report Status 10/28/2013 FINAL   Final  ANAEROBIC CULTURE     Status: None   Collection Time    10/28/13  2:26 PM      Result Value Ref Range Status   Specimen Description ABSCESS AORTA   Final   Special Requests PT ON VANCOMYCIN AND ZINACEF   Final   Gram Stain     Final   Value: RARE WBC PRESENT,BOTH PMN AND  MONONUCLEAR     NO ORGANISMS SEEN     Performed at [facility]   Culture     Final   Value: NO ANAEROBES ISOLATED     Performed at [facility]   Report Status 11/02/2013 FINAL   Final  CULTURE, ROUTINE-ABSCESS     Status: None   Collection Time    10/28/13  2:26 PM      Result Value Ref Range Status   Specimen Description ABSCESS AORTA   Final   Special Requests PT ON VANCOMYCIN AND ZINACEF   Final   Gram Stain     Final   Value: RARE WBC PRESENT, PREDOMINANTLY PMN     NO SQUAMOUS EPITHELIAL CELLS SEEN     NO ORGANISMS SEEN     Performed at [facility]     Performed at [facility]   Culture     Final   Value: NO GROWTH 3 DAYS     Performed at [facility]   Report Status 10/31/2013 FINAL   Final  CLOSTRIDIUM DIFFICILE BY PCR     Status: None   Collection Time    11/02/13 10:04 AM      Result Value Ref Range Status   C difficile by pcr NEGATIVE  NEGATIVE Final  URINE CULTURE     Status: None   Collection Time    11/04/13  9:45 AM      Result Value Ref Range Status   Specimen Description URINE, CLEAN CATCH   Final   Special Requests Nafcillin, Rifampin, Gent Normal   Final   Culture  Setup Time     Final   Value: 11/04/2013 10:58     Performed at [facility]     Final   Value: NO GROWTH     Performed at [facility]  Culture     Final   Value: NO GROWTH     Performed at [facility]   Report Status 11/05/2013 FINAL   Final    Anti-infectives   Start     Dose/Rate Route Frequency Ordered Stop   11/06/13 1400  nafcillin 2 g in dextrose 5 % 50 mL IVPB     2 g 100 mL/hr over 30 Minutes Intravenous 6 times per day 11/06/13 1347     11/05/13 1800  gentamicin (GARAMYCIN) IVPB 100 mg     100 mg 200 mL/hr over 30 Minutes Intravenous Every M-W-F (1800) 11/04/13 1317 11/12/13 2359   11/04/13 1300  gentamicin (GARAMYCIN) IVPB 100 mg     100 mg 200 mL/hr over 30 Minutes Intravenous  Once  11/04/13 1146 11/04/13 1320   11/03/13 1200  gentamicin (GARAMYCIN) IVPB 100 mg  Status:  Discontinued     100 mg 200 mL/hr over 30 Minutes Intravenous Every M-W-F (Hemodialysis) 11/01/13 1429 11/04/13 1317   11/02/13 2200  rifampin (RIFADIN) capsule 300 mg     300 mg Oral 3 times per day 11/02/13 1426     11/01/13 1200  gentamicin (GARAMYCIN) IVPB 100 mg  Status:  Discontinued     100 mg 200 mL/hr over 30 Minutes Intravenous Every T-Th-Sa (Hemodialysis) 10/30/13 1037 11/01/13 1429   10/31/13 1200  gentamicin (GARAMYCIN) IVPB 80 mg  Status:  Discontinued     80 mg 100 mL/hr over 30 Minutes Intravenous Every Dialysis 10/30/13 0047 10/30/13 1027   10/30/13 1300  gentamicin (GARAMYCIN) 120 mg in dextrose 5 % 50 mL IVPB     120 mg 106 mL/hr over 30 Minutes Intravenous  Once 10/30/13 1037 10/30/13 1615   10/30/13 0500  gentamicin (GARAMYCIN) IVPB 80 mg     80 mg 100 mL/hr over 30 Minutes Intravenous  Once 10/30/13 0047 10/30/13 0526   10/29/13 1600  levofloxacin (LEVAQUIN) IVPB 500 mg  Status:  Discontinued     500 mg 100 mL/hr over 60 Minutes Intravenous Every 48 hours 10/27/13 1457 10/29/13 0857   10/29/13 1000  rifampin (RIFADIN) 300 mg in sodium chloride 0.9 % 100 mL IVPB     300 mg 200 mL/hr over 30 Minutes Intravenous 3 times per day 10/29/13 0858 11/02/13 1520   10/29/13 1000  nafcillin 2 g in dextrose 5 % 50 mL IVPB  Status:  Discontinued     2 g 100 mL/hr over 30 Minutes Intravenous 6 times per day 10/29/13 0901 11/06/13 1347   10/29/13 0900  nafcillin injection 2 g  Status:  Discontinued     2 g Intravenous Every 4 hours 10/29/13 0857 10/29/13 0900   10/29/13 0500  vancomycin (VANCOCIN) IVPB 1000 mg/200 mL premix     1,000 mg 200 mL/hr over 60 Minutes Intravenous  Once 10/28/13 2146 10/29/13 0600   10/29/13 0100  cefUROXime (ZINACEF) 1.5 g in dextrose 5 % 50 mL IVPB  Status:  Discontinued     1.5 g 100 mL/hr over 30 Minutes Intravenous Every 12 hours 10/28/13 2146 10/29/13 0857    10/28/13 0400  vancomycin (VANCOCIN) 1,250 mg in sodium chloride 0.9 % 250 mL IVPB     1,250 mg 166.7 mL/hr over 90 Minutes Intravenous To Surgery 10/27/13 1450 10/28/13 0905   10/28/13 0400  cefUROXime (ZINACEF) 1.5 g in dextrose 5 % 50 mL IVPB     1.5 g 100 mL/hr over 30 Minutes Intravenous To Surgery 10/27/13 1450 10/28/13 2030  10/28/13 0400  cefUROXime (ZINACEF) 750 mg in dextrose 5 % 50 mL IVPB  Status:  Discontinued     750 mg 100 mL/hr over 30 Minutes Intravenous To Surgery 10/27/13 1447 10/28/13 2114   10/27/13 1600  levofloxacin (LEVAQUIN) IVPB 750 mg     750 mg 100 mL/hr over 90 Minutes Intravenous  Once 10/27/13 1456 10/28/13 0231   10/23/13 0800  ceFAZolin (ANCEF) IVPB 1 g/50 mL premix  Status:  Discontinued     1 g 100 mL/hr over 30 Minutes Intravenous  Once 10/22/13 2313 10/22/13 2326   10/22/13 2359  ceFAZolin (ANCEF) IVPB 2 g/50 mL premix  Status:  Discontinued     2 g 100 mL/hr over 30 Minutes Intravenous Every M-W-F (Hemodialysis) 10/22/13 2314 10/28/13 2146   10/22/13 2315  ceFAZolin (ANCEF) IVPB 2 g/50 mL premix  Status:  Discontinued     2 g 100 mL/hr over 30 Minutes Intravenous  Once 10/22/13 2313 10/22/13 2326     Assessment: Patient is a 58 y.o M on nafcillin and rifampin for paravalvular abscess and MSSA bacteremia/endocarditis.  To start gentamicin for synergy.  A random Gentamicin level was drawn today and is 3.7 mcg/ml.  He is within range with current dosing regimen.  Goal of Therapy:  Gentamicin peak= 4  Plan:  1) Will continue current dose of 100 mg after each subsequent HD session. 2) F/U levels as appropriate.  Rober Minion, PharmD., MS Clinical Pharmacist Pager:  [phone] Thank you for allowing pharmacy to be part of this patients care team. 11/06/2013,6:13 PM

## 2013-11-06 NOTE — Progress Notes (Signed)
Subjective: 2.5kg off w HD yest, BP's are better, off pressors  Filed Vitals:   11/06/13 0530 11/06/13 0600 11/06/13 0630 11/06/13 0700  BP: 90/54 109/70 112/80   Pulse: 91 96 94 93  Temp:      TempSrc:      Resp: 14 13 30 21   Height:      Weight:      SpO2: 96% 96% 95% 98%   Exam: No distress +JVD Chest clear on R, dec'd L base RRR no MRG Abd soft, obese NTND Bilat 1-2+ pitting LE and UE edema,  Neuro is nf, Ox3 LFA AVF patent, R IJ Diatek  CXR 4/9 - edema resolved, L effusion persists  Dialysis: TTS [facility] 4h   86kg   3K/2.25 [place]   R IJ Cath / maturing LFA AVF (placed 3/11)  Heparin 8600  Hectorol none     Epo none Recent lab: Hb 10.1 ferritin 836  pth 175   Assessment: 1 Prosthetic valve endocarditis / root abcess, s/p redo AVR 3/31 - IV abx per ID 2 Vol excess- cont midodrine and daily HD for vol overload 3 ESRD - recent AKI during hospital stay in Feb, 1st HD was 09/17/12 4 L arm AV fistula- patent, placed on 3/11 5 Anemia resumed darbe 100/wk, Hb 8.9, check tsat   Plan- as above    Kelly Splinter MD  pager [phone]    cell [phone]  11/06/2013, 10:21 AM     Recent Labs Lab 11/04/13 0345 11/05/13 0435 11/06/13 0350  NA 137 139 139  K 3.4* 3.6* 3.7  CL 96 99 97  CO2 24 26 27   GLUCOSE 88 91 87  BUN 20 16 12   CREATININE 4.03* 3.45* 3.14*  CALCIUM 7.5* 7.7* 7.8*    Recent Labs Lab 11/04/13 0345 11/05/13 0435 11/06/13 0350  AST 48* 37 33  ALT 27 21 17   ALKPHOS 77 73 76  BILITOT 0.8 0.8 0.6  PROT 5.0* 5.2* 5.1*  ALBUMIN 1.6* 1.8* 1.6*    Recent Labs Lab 11/04/13 0345 11/05/13 0435 11/06/13 0350  WBC 8.6 8.6 8.8  HGB 8.8* 8.4* 8.1*  HCT 25.6* 25.2* 23.9*  MCV 83.1 84.3 84.2  PLT 157 202 216   . amiodarone  200 mg Oral Daily  . aspirin EC  81 mg Oral Daily  . bisacodyl  10 mg Oral Daily   Or  . bisacodyl  10 mg Rectal Daily  . chlorhexidine  15 mL Mouth Rinse BID  . clonazePAM  0.5 mg Oral BID  . darbepoetin (ARANESP)  injection - DIALYSIS  100 mcg Intravenous Q Tue-HD  . docusate sodium  200 mg Oral Daily  . [START ON 11/07/2013] feeding supplement (NEPRO CARB STEADY)  237 mL Oral BID BM  . ferrous Q000111Q C-folic acid  1 capsule Oral q morning - 10a  . gentamicin  100 mg Intravenous Q M,W,F-1800  . insulin aspart  0-24 Units Subcutaneous TID AC & HS  . levothyroxine  200 mcg Oral QAC breakfast  . midodrine  10 mg Oral BID WC  . mirtazapine  15 mg Oral QHS  . nafcillin IV  2 g Intravenous 6 times per day  . pantoprazole  40 mg Oral Daily  . rifampin  300 mg Oral 3 times per day  . sertraline  50 mg Oral Daily  . sodium chloride  3 mL Intravenous Q12H   . sodium chloride 20 mL/hr at 11/04/13 2146  . DOPamine Stopped (11/05/13 1200)  sodium chloride, sodium chloride, albumin human, ALPRAZolam, feeding supplement (NEPRO CARB STEADY), fentaNYL, heparin, lidocaine (PF), lidocaine-prilocaine, ondansetron (ZOFRAN) IV, pentafluoroprop-tetrafluoroeth, sodium chloride

## 2013-11-06 NOTE — Progress Notes (Signed)
OT Cancellation Note  Patient Details Name: Bradley [name] MRN: [id number] DOB: 1956-06-06   Cancelled Treatment:    Reason Eval/Treat Not Completed: Patient at procedure or test/ unavailable Attempted to see pt this am. Just beginning HD. Will return later if able.  [place], [place]  [phone] 11/06/2013 11/06/2013, 11:23 AM

## 2013-11-06 NOTE — Progress Notes (Signed)
Physical Therapy Treatment Patient Details Name: Bradley [name] MRN: [id number] DOB: [birth year] Today's Date: 11/06/2013    History of Present Illness Pt admit with sepsis and r/o endocarditis.  Anoxic brain injury.  Underwent redo of sternotomy and Bentall procedure 10/28/13    PT Comments    Progressing well, still with poor endurance.  Needs consistent cuing for sternal precautions, significant work yet to be done on bed mobility.  Could use a stent on CIR  Follow Up Recommendations  CIR;Supervision/Assistance - 24 hour     Equipment Recommendations       Recommendations for Other Services       Precautions / Restrictions Precautions Precaution Comments: Pt needs reminders for sternal precautions.     Mobility  Bed Mobility   Bed Mobility: Supine to Sit     Supine to sit: Mod assist     General bed mobility comments: cuing for guidance and truncal assist  Transfers Overall transfer level: Needs assistance   Transfers: Sit to/from Stand;Stand Pivot Transfers Sit to Stand: Min assist Stand pivot transfers: Min assist       General transfer comment: cues to reinforce sternal precautions; steady assist.  Stood times 5  during pericare for diarrhea  Ambulation/Gait Ambulation/Gait assistance: Min guard Ambulation Distance (Feet): 360 Feet Assistive device: Rolling walker (2 wheeled) Gait Pattern/deviations: Step-through pattern;Wide base of support (drifts or lists L on occasion) Gait velocity: prefers slow, but able to speed up on cue   General Gait Details: generally steady, not as dyspneic.  Tends to drift L   Stairs            Wheelchair Mobility    Modified Rankin (Stroke Patients Only)       Balance Overall balance assessment: Needs assistance Sitting-balance support: Feet supported;No upper extremity supported Sitting balance-Leahy Scale: Good     Standing balance support: No upper extremity supported;Bilateral upper extremity  supported Standing balance-Leahy Scale: Fair Standing balance comment: during pericare, standing and not totally reliant on the walker                    Cognition Arousal/Alertness: Awake/alert Behavior During Therapy: Flat affect Overall Cognitive Status: Impaired/Different from baseline                      Exercises      General Comments        Pertinent Vitals/Pain VSS    Home Living                      Prior Function            PT Goals (current goals can now be found in the care plan section) Acute Rehab PT Goals Patient Stated Goal: to go home PT Goal Formulation: With patient Time For Goal Achievement: 11/08/13 Potential to Achieve Goals: Good Progress towards PT goals: Progressing toward goals    Frequency  Min 3X/week    PT Plan Current plan remains appropriate    Co-evaluation             End of Session   Activity Tolerance: Patient limited by fatigue Patient left: in chair;with call bell/phone within reach;with family/visitor present     Time: [id number] PT Time Calculation (min): 30 min  Charges:  $Gait Training: 8-22 mins $Therapeutic Activity: 8-22 mins                    G Codes:  Tessie Fass Jonie Burdell 11/06/2013, 10:28 AM 11/06/2013  Donnella Sham, [place] [phone]  (pager)

## 2013-11-07 ENCOUNTER — Inpatient Hospital Stay (HOSPITAL_COMMUNITY): Payer: Medicaid Other

## 2013-11-07 DIAGNOSIS — I2690 Septic pulmonary embolism without acute cor pulmonale: Secondary | ICD-10-CM

## 2013-11-07 DIAGNOSIS — Y849 Medical procedure, unspecified as the cause of abnormal reaction of the patient, or of later complication, without mention of misadventure at the time of the procedure: Secondary | ICD-10-CM

## 2013-11-07 LAB — GLUCOSE, CAPILLARY
Glucose-Capillary: 86 mg/dL (ref 70–99)
Glucose-Capillary: 98 mg/dL (ref 70–99)
Glucose-Capillary: 98 mg/dL (ref 70–99)

## 2013-11-07 LAB — BASIC METABOLIC PANEL
BUN: 10 mg/dL (ref 6–23)
CO2: 27 mEq/L (ref 19–32)
Calcium: 8.2 mg/dL — ABNORMAL LOW (ref 8.4–10.5)
Chloride: 97 mEq/L (ref 96–112)
Creatinine, Ser: 3.3 mg/dL — ABNORMAL HIGH (ref 0.50–1.35)
GFR calc Af Amer: 23 mL/min — ABNORMAL LOW (ref >90)
GFR calc non Af Amer: 19 mL/min — ABNORMAL LOW (ref >90)
Glucose, Bld: 88 mg/dL (ref 70–99)
Potassium: 3.8 mEq/L (ref 3.7–5.3)
Sodium: 138 mEq/L (ref 137–147)

## 2013-11-07 LAB — CBC
HCT: 25.1 % — ABNORMAL LOW (ref 39.0–52.0)
Hemoglobin: 8.5 g/dL — ABNORMAL LOW (ref 13.0–17.0)
MCH: 29 pg (ref 26.0–34.0)
MCHC: 33.9 g/dL (ref 30.0–36.0)
MCV: 85.7 fL (ref 78.0–100.0)
Platelets: 245 10*3/uL (ref 150–400)
RBC: 2.93 MIL/uL — ABNORMAL LOW (ref 4.22–5.81)
RDW: 20.7 % — ABNORMAL HIGH (ref 11.5–15.5)
WBC: 7.4 10*3/uL (ref 4.0–10.5)

## 2013-11-07 MED ORDER — PENTAFLUOROPROP-TETRAFLUOROETH EX AERO: 1.0000 "application " | INHALATION_SPRAY | CUTANEOUS | Status: DC | PRN

## 2013-11-07 MED ORDER — HEPARIN SODIUM (PORCINE) 1000 UNIT/ML DIALYSIS: 2000.0000 [IU] | INTRAMUSCULAR | Status: DC | PRN

## 2013-11-07 MED ORDER — LIDOCAINE HCL (PF) 1 % IJ SOLN: 5.0000 mL | INTRAMUSCULAR | Status: DC | PRN

## 2013-11-07 MED ORDER — ALTEPLASE 2 MG IJ SOLR: 2.0000 mg | Freq: Once | INTRAMUSCULAR | Status: DC | PRN

## 2013-11-07 MED ORDER — NEPRO/CARBSTEADY PO LIQD: 237.0000 mL | ORAL | Status: DC | PRN

## 2013-11-07 MED ORDER — HEPARIN SODIUM (PORCINE) 1000 UNIT/ML DIALYSIS
1000.0000 [IU] | INTRAMUSCULAR | Status: DC | PRN
  Filled 2013-11-07: qty 1

## 2013-11-07 MED ORDER — HEPARIN SODIUM (PORCINE) 1000 UNIT/ML DIALYSIS
2000.0000 [IU] | INTRAMUSCULAR | Status: DC | PRN
  Filled 2013-11-07: qty 2

## 2013-11-07 MED ORDER — SODIUM CHLORIDE 0.9 % IV SOLN: 100.0000 mL | INTRAVENOUS | Status: DC | PRN

## 2013-11-07 MED ORDER — LIDOCAINE-PRILOCAINE 2.5-2.5 % EX CREA
1.0000 "application " | TOPICAL_CREAM | CUTANEOUS | Status: DC | PRN
  Filled 2013-11-07: qty 5

## 2013-11-07 MED ORDER — IOHEXOL 300 MG/ML  SOLN
50.0000 mL | Freq: Once | INTRAMUSCULAR | Status: AC | PRN
  Administered 2013-11-07: 50 mL via INTRAVENOUS

## 2013-11-07 MED ORDER — ALPRAZOLAM 0.5 MG PO TABS
ORAL_TABLET | ORAL | Status: AC
  Filled 2013-11-07: qty 1

## 2013-11-07 MED ORDER — HEPARIN SODIUM (PORCINE) 1000 UNIT/ML DIALYSIS: 1000.0000 [IU] | INTRAMUSCULAR | Status: DC | PRN

## 2013-11-07 MED ORDER — LIDOCAINE-PRILOCAINE 2.5-2.5 % EX CREA: 1.0000 "application " | TOPICAL_CREAM | CUTANEOUS | Status: DC | PRN

## 2013-11-07 MED ORDER — NEPRO/CARBSTEADY PO LIQD
237.0000 mL | ORAL | Status: DC | PRN
  Filled 2013-11-07: qty 237

## 2013-11-07 MED ORDER — ALTEPLASE 2 MG IJ SOLR
2.0000 mg | Freq: Once | INTRAMUSCULAR | Status: DC | PRN
  Filled 2013-11-07: qty 2

## 2013-11-07 NOTE — Progress Notes (Signed)
Subjective: 2.5kg off w HD yest, BP's are better, off pressors  Filed Vitals:   11/06/13 1415 11/06/13 1500 11/06/13 2033 11/07/13 0415  BP: 109/63 100/74 118/85 108/78  Pulse: 93 97 94 90  Temp:  97.6 F (36.4 C) 98.8 F (37.1 C) 98.3 F (36.8 C)  TempSrc:  Oral Oral Oral  Resp: 22 20 18 20   Height:      Weight:    92.262 kg (203 lb 6.4 oz)  SpO2:  90% 98% 96%   Exam: No distress +JVD Chest clear on R, dec'd L base RRR no MRG Abd soft, obese NTND Bilat 1-2+ pitting LE and UE edema,  Neuro is nf, Ox3 LFA AVF patent, R IJ Diatek  CXR 4/9 - edema resolved, L effusion persists  Dialysis: TTS [facility] 4h   86kg   3K/2.25 [place]   R IJ Cath / maturing LFA AVF (placed 3/11)  Heparin 8600  Hectorol none     Epo none Recent lab: Hb 10.1 ferritin 836  pth 175   Assessment: 1 Prosthetic valve endocarditis / root abcess, s/p redo AVR 3/31 - IV abx per ID 2 Vol excess- cont midodrine and daily HD for vol overload, improving 3 ESRD - recent AKI during hospital stay in Feb, 1st HD was 09/17/12 4 L arm AV fistula- patent, placed on 3/11 5 Anemia resumed darbe 100/wk, Hb 8.9, check tsat   Plan- HD today and tomorrow, HD upstairs    Kelly Splinter MD  pager [phone]    cell [phone]  11/07/2013, 6:56 AM     Recent Labs Lab 11/05/13 0435 11/06/13 0350 11/07/13 0415  NA 139 139 138  K 3.6* 3.7 3.8  CL 99 97 97  CO2 26 27 27   GLUCOSE 91 87 88  BUN 16 12 10   CREATININE 3.45* 3.14* 3.30*  CALCIUM 7.7* 7.8* 8.2*    Recent Labs Lab 11/04/13 0345 11/05/13 0435 11/06/13 0350  AST 48* 37 33  ALT 27 21 17   ALKPHOS 77 73 76  BILITOT 0.8 0.8 0.6  PROT 5.0* 5.2* 5.1*  ALBUMIN 1.6* 1.8* 1.6*    Recent Labs Lab 11/05/13 0435 11/06/13 0350 11/07/13 0415  WBC 8.6 8.8 7.4  HGB 8.4* 8.1* 8.5*  HCT 25.2* 23.9* 25.1*  MCV 84.3 84.2 85.7  PLT 202 216 245   . amiodarone  200 mg Oral Daily  . aspirin EC  81 mg Oral Daily  . aspirin EC  81 mg Oral Daily  .  chlorhexidine  15 mL Mouth Rinse BID  . clonazePAM  0.5 mg Oral BID  . darbepoetin (ARANESP) injection - DIALYSIS  100 mcg Intravenous Q Tue-HD  . docusate sodium  200 mg Oral Daily  . feeding supplement (NEPRO CARB STEADY)  237 mL Oral BID BM  . ferrous Q000111Q C-folic acid  1 capsule Oral q morning - 10a  . gentamicin  100 mg Intravenous Q M,W,F-1800  . insulin aspart  0-24 Units Subcutaneous TID AC & HS  . levothyroxine  200 mcg Oral QAC breakfast  . midodrine  10 mg Oral BID WC  . mirtazapine  15 mg Oral QHS  . nafcillin IV  2 g Intravenous 6 times per day  . pantoprazole  40 mg Oral QAC breakfast  . rifampin  300 mg Oral 3 times per day  . sertraline  50 mg Oral Daily  . sodium chloride  3 mL Intravenous Q12H   . sodium chloride 20 mL/hr at 11/04/13 2146  sodium chloride, sodium chloride, sodium chloride, albumin human, ALPRAZolam, bisacodyl, bisacodyl, feeding supplement (NEPRO CARB STEADY), heparin, lidocaine (PF), lidocaine-prilocaine, ondansetron (ZOFRAN) IV, ondansetron, pentafluoroprop-tetrafluoroeth, sodium chloride

## 2013-11-07 NOTE — Procedures (Signed)
I was present at this dialysis session, have reviewed the session itself and made  appropriate changes  Kelly Splinter MD (pgr) [phone]    [phone] 11/07/2013, 4:08 PM

## 2013-11-07 NOTE — Progress Notes (Signed)
Physical Therapy Treatment Patient Details Name: Bradley [name] MRN: [id number] DOB: 11/08/1955 Today's Date: Nov 12, 2013    History of Present Illness Pt admit with sepsis and r/o endocarditis.  Anoxic brain injury.  Underwent redo of sternotomy and Bentall procedure 10/28/13    PT Comments    Pt with increased anxiety but is motivated to improve, requires encouragement throughout.  Pt continues to require cuing for sternal precautions but is improving strength with sit to stands.  Continue to recommend CIR to increase functional independence.  Follow Up Recommendations  CIR;Supervision/Assistance - 24 hour     Equipment Recommendations  Rolling walker with 5" wheels;3in1 (PT)    Recommendations for Other Services Rehab consult     Precautions / Restrictions Precautions Precautions: Fall;Sternal Precaution Comments: Pt needs reminders for sternal precautions.  Restrictions Other Position/Activity Restrictions: sternal precautions    Mobility  Bed Mobility                  Transfers       Sit to Stand: Min assist Stand pivot transfers: Min assist       General transfer comment: cues for sternal precautions, assist to stand from low chair, supervision to stand from [facility]  Ambulation/Gait Ambulation/Gait assistance: Min guard Ambulation Distance (Feet): 200 Feet Assistive device: Rolling walker (2 wheeled)     Gait velocity interpretation: Below normal speed for age/gender General Gait Details: steady but requires 2 x standing rest breaks   Stairs            Wheelchair Mobility    Modified Rankin (Stroke Patients Only)       Balance                                    Cognition Arousal/Alertness: Awake/alert Behavior During Therapy: Anxious Overall Cognitive Status: Within Functional Limits for tasks assessed                      Exercises      General Comments        Pertinent Vitals/Pain No c/o pain, HR  108-110 throughout treatment    Home Living                      Prior Function            PT Goals (current goals can now be found in the care plan section) Progress towards PT goals: Progressing toward goals    Frequency  Min 3X/week    PT Plan Current plan remains appropriate    Co-evaluation             End of Session Equipment Utilized During Treatment: Gait belt Activity Tolerance: Patient tolerated treatment well Patient left: in chair;with call bell/phone within reach;with family/visitor present     Time: 1300-1330 PT Time Calculation (min): 30 min  Charges:  $Gait Training: 8-22 mins $Therapeutic Activity: 8-22 mins                    G Codes:      Bradley [name] 2013/11/12, 2:10 PM

## 2013-11-07 NOTE — Progress Notes (Signed)
Occupational Therapy Treatment Patient Details Name: Bradley [name] MRN: [id number] DOB: [birth year] Today's Date: 11/07/2013    History of present illness Pt admit with sepsis and r/o endocarditis.  Anoxic brain injury.  Underwent redo of sternotomy and Bentall procedure 10/28/13   OT comments  Pt. Agreeable to OOB.  Noted improvements with bed mobility and un supported sitting balance.  Still requiring assistance for functional transfers with cues for maintaining precautions.  Tolerating more activity before fatigue.  Motivated for participation in skilled therapies.  Agree with recommendation for CIR for con't. Strengthening and safety for d/c home.    Follow Up Recommendations  CIR;Supervision/Assistance - 24 hour    Equipment Recommendations  None recommended by OT    Recommendations for Other Services Rehab consult    Precautions / Restrictions Precautions Precautions: Fall;Sternal Precaution Comments: Pt needs reminders for sternal precautions.        Mobility Bed Mobility Overal bed mobility: Needs Assistance Bed Mobility: Rolling;Sidelying to Sit Rolling: Mod assist Sidelying to sit: Mod assist       General bed mobility comments: cuing for guidance and truncal assist  Transfers Overall transfer level: Needs assistance Equipment used: 1 person hand held assist Transfers: Sit to/from Stand;Stand Pivot Transfers Sit to Stand: Min assist Stand pivot transfers: Min assist       General transfer comment: cues to reinforce sternal precautions; steady assist.                                        ADL Overall ADL's : Needs assistance/impaired     Grooming: Wash/dry face;Supervision/safety Grooming Details (indicate cue type and reason): able to bring wash cloth to face in un supported sitting with s   Upper Body Bathing Details (indicate cue type and reason): sim. mod a sitting   Lower Body Bathing Details (indicate cue type and reason): sim.  mod/max a sit/stand    Upper Body Dressing Details (indicate cue type and reason): sim. min a sitting   Lower Body Dressing Details (indicate cue type and reason): sim. max a Web designer Details (indicate cue type and reason): sim. eob to recliner.  min a sit/stand with max cues for maintaining sternal precautions.  pt. initiated a rocking motion 1,2,3 to help with transitioning sit/stand.  2-3 pivotal steps to recliner.  max cues and min a stand/sit while maintaining precautions   Toileting - Clothing Manipulation Details (indicate cue type and reason): sim. mod a sit/stand     Functional mobility during ADLs: Minimal assistance                                                                                                      Pertinent Vitals/ Pain       No c/o pain, just c/o of sadness and being upset about RN issues. (see below)  Frequency Min 3X/week     Progress Toward Goals  OT Goals(current goals can now be found in the care plan section)  Progress towards OT goals: Progressing toward goals     Plan Discharge plan remains appropriate                     End of Session     Activity Tolerance Patient tolerated treatment well   Patient Left in chair;with call bell/phone within reach   Nurse Communication Other (comment) (informed RN that pt. 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  reviewed pts. concerns. encouraged RN to follow up with this information)        Time: [id number] OT Time Calculation (min): 21 min  Charges: OT General Charges $OT Visit: 1 Procedure OT Treatments $Self Care/Home Management : 8-22 mins  Rico Junker Illona Bulman, COTA/L 11/07/2013, 9:26 AM

## 2013-11-07 NOTE — Progress Notes (Signed)
1500 Cardiac Rehab Pt ambulated with PT 200 feet today. He is now in dialysis. We will follow pt tomorrow. Deon Pilling, RN 11/07/2013 3:46 PM

## 2013-11-07 NOTE — Progress Notes (Signed)
PT Cancellation Note  Patient Details Name: Bradley [name] MRN: [id number] DOB: 1956/01/02   Cancelled Treatment:    Reason Eval/Treat Not Completed: Medical issues which prohibited therapy.  Pt reports he is having anxiety issues and just received medicine, asked PT to check back later.  Will try back as appropriate.   [name] 11/07/2013, 11:31 AM

## 2013-11-07 NOTE — Progress Notes (Signed)
[facility]   Day # 10  nafcillin Day # 10 rifampin Day # 8 gentamicin   Subjective: Sleeping during HD  Antibiotics:  Anti-infectives   Start     Dose/Rate Route Frequency Ordered Stop   11/06/13 1400  nafcillin 2 g in dextrose 5 % 50 mL IVPB     2 g 100 mL/hr over 30 Minutes Intravenous 6 times per day 11/06/13 1347     11/05/13 1800  gentamicin (GARAMYCIN) IVPB 100 mg     100 mg 200 mL/hr over 30 Minutes Intravenous Every M-W-F (1800) 11/04/13 1317 11/12/13 2359   11/04/13 1300  gentamicin (GARAMYCIN) IVPB 100 mg     100 mg 200 mL/hr over 30 Minutes Intravenous  Once 11/04/13 1146 11/04/13 1320   11/03/13 1200  gentamicin (GARAMYCIN) IVPB 100 mg  Status:  Discontinued     100 mg 200 mL/hr over 30 Minutes Intravenous Every M-W-F (Hemodialysis) 11/01/13 1429 11/04/13 1317   11/02/13 2200  rifampin (RIFADIN) capsule 300 mg     300 mg Oral 3 times per day 11/02/13 1426     11/01/13 1200  gentamicin (GARAMYCIN) IVPB 100 mg  Status:  Discontinued     100 mg 200 mL/hr over 30 Minutes Intravenous Every T-Th-Sa (Hemodialysis) 10/30/13 1037 11/01/13 1429   10/31/13 1200  gentamicin (GARAMYCIN) IVPB 80 mg  Status:  Discontinued     80 mg 100 mL/hr over 30 Minutes Intravenous Every Dialysis 10/30/13 0047 10/30/13 1027   10/30/13 1300  gentamicin (GARAMYCIN) 120 mg in dextrose 5 % 50 mL IVPB     120 mg 106 mL/hr over 30 Minutes Intravenous  Once 10/30/13 1037 10/30/13 1615   10/30/13 0500  gentamicin (GARAMYCIN) IVPB 80 mg     80 mg 100 mL/hr over 30 Minutes Intravenous  Once 10/30/13 0047 10/30/13 0526   10/29/13 1600  levofloxacin (LEVAQUIN) IVPB 500 mg  Status:  Discontinued     500 mg 100 mL/hr over 60 Minutes Intravenous Every 48 hours 10/27/13 1457 10/29/13 0857   10/29/13 1000  rifampin (RIFADIN) 300 mg in sodium chloride 0.9 % 100 mL IVPB     300 mg 200 mL/hr over 30 Minutes Intravenous 3 times per day 10/29/13 0858 11/02/13 1520   10/29/13  1000  nafcillin 2 g in dextrose 5 % 50 mL IVPB  Status:  Discontinued     2 g 100 mL/hr over 30 Minutes Intravenous 6 times per day 10/29/13 0901 11/06/13 1347   10/29/13 0900  nafcillin injection 2 g  Status:  Discontinued     2 g Intravenous Every 4 hours 10/29/13 0857 10/29/13 0900   10/29/13 0500  vancomycin (VANCOCIN) IVPB 1000 mg/200 mL premix     1,000 mg 200 mL/hr over 60 Minutes Intravenous  Once 10/28/13 2146 10/29/13 0600   10/29/13 0100  cefUROXime (ZINACEF) 1.5 g in dextrose 5 % 50 mL IVPB  Status:  Discontinued     1.5 g 100 mL/hr over 30 Minutes Intravenous Every 12 hours 10/28/13 2146 10/29/13 0857   10/28/13 0400  vancomycin (VANCOCIN) 1,250 mg in sodium chloride 0.9 % 250 mL IVPB     1,250 mg 166.7 mL/hr over 90 Minutes Intravenous To Surgery 10/27/13 1450 10/28/13 0905   10/28/13 0400  cefUROXime (ZINACEF) 1.5 g in dextrose 5 % 50 mL IVPB     1.5 g 100 mL/hr over 30 Minutes Intravenous To Surgery 10/27/13 1450 10/28/13 2030   10/28/13  0400  cefUROXime (ZINACEF) 750 mg in dextrose 5 % 50 mL IVPB  Status:  Discontinued     750 mg 100 mL/hr over 30 Minutes Intravenous To Surgery 10/27/13 1447 10/28/13 2114   10/27/13 1600  levofloxacin (LEVAQUIN) IVPB 750 mg     750 mg 100 mL/hr over 90 Minutes Intravenous  Once 10/27/13 1456 10/28/13 0231   10/23/13 0800  ceFAZolin (ANCEF) IVPB 1 g/50 mL premix  Status:  Discontinued     1 g 100 mL/hr over 30 Minutes Intravenous  Once 10/22/13 2313 10/22/13 2326   10/22/13 2359  ceFAZolin (ANCEF) IVPB 2 g/50 mL premix  Status:  Discontinued     2 g 100 mL/hr over 30 Minutes Intravenous Every M-W-F (Hemodialysis) 10/22/13 2314 10/28/13 2146   10/22/13 2315  ceFAZolin (ANCEF) IVPB 2 g/50 mL premix  Status:  Discontinued     2 g 100 mL/hr over 30 Minutes Intravenous  Once 10/22/13 2313 10/22/13 2326      Medications: Scheduled Meds: . ALPRAZolam      . amiodarone  200 mg Oral Daily  . aspirin EC  81 mg Oral Daily  . aspirin EC   81 mg Oral Daily  . chlorhexidine  15 mL Mouth Rinse BID  . clonazePAM  0.5 mg Oral BID  . darbepoetin (ARANESP) injection - DIALYSIS  100 mcg Intravenous Q Tue-HD  . docusate sodium  200 mg Oral Daily  . feeding supplement (NEPRO CARB STEADY)  237 mL Oral BID BM  . ferrous [id number] C-folic acid  1 capsule Oral q morning - 10a  . gentamicin  100 mg Intravenous Q M,W,F-1800  . insulin aspart  0-24 Units Subcutaneous TID AC & HS  . levothyroxine  200 mcg Oral QAC breakfast  . midodrine  10 mg Oral BID WC  . mirtazapine  15 mg Oral QHS  . nafcillin IV  2 g Intravenous 6 times per day  . pantoprazole  40 mg Oral QAC breakfast  . rifampin  300 mg Oral 3 times per day  . sertraline  50 mg Oral Daily  . sodium chloride  3 mL Intravenous Q12H   Continuous Infusions: . sodium chloride 20 mL/hr at 11/04/13 2146   PRN Meds:.sodium chloride, albumin human, ALPRAZolam, bisacodyl, bisacodyl, ondansetron (ZOFRAN) IV, ondansetron, sodium chloride   Objective: Weight change: -1 lb 15.7 oz (-0.9 kg)  Intake/Output Summary (Last 24 hours) at 11/07/13 1948 Last data filed at 11/07/13 1720  Gross per 24 hour  Intake    120 ml  Output   2543 ml  Net  -2423 ml   Blood pressure 108/79, pulse 86, temperature 97.5 F (36.4 C), temperature source Oral, resp. rate 20, height 6' 1.5" (1.867 m), weight 199 lb 11.8 oz (90.6 kg), SpO2 100.00%. Temp:  [97.5 F (36.4 C)-98.8 F (37.1 C)] 97.5 F (36.4 C) (04/10 1720) Pulse Rate:  [85-98] 86 (04/10 1720) Resp:  [17-20] 20 (04/10 1720) BP: (99-123)/(66-90) 108/79 mmHg (04/10 1720) SpO2:  [96 %-100 %] 100 % (04/10 1720) Weight:  [199 lb 11.8 oz (90.6 kg)-204 lb 2.3 oz (92.6 kg)] 199 lb 11.8 oz (90.6 kg) (04/10 1720)  Physical Exam: General: asleep during HD, I did not try to rouse too vigorously Lab Results:  Recent Labs  11/06/13 0350 11/07/13 0415  WBC 8.8 7.4  HGB 8.1* 8.5*  HCT 23.9* 25.1*  PLT 216 245    BMET  Recent Labs   11/06/13 0350 11/07/13 0415  NA 139 138  K 3.7 3.8  CL 97 97  CO2 27 27  GLUCOSE 87 88  BUN 12 10  CREATININE 3.14* 3.30*  CALCIUM 7.8* 8.2*    Micro Results: Recent Results (from the past 240 hour(s))  CLOSTRIDIUM DIFFICILE BY PCR     Status: None   Collection Time    11/02/13 10:04 AM      Result Value Ref Range Status   C difficile by pcr NEGATIVE  NEGATIVE Final  URINE CULTURE     Status: None   Collection Time    11/04/13  9:45 AM      Result Value Ref Range Status   Specimen Description URINE, CLEAN CATCH   Final   Special Requests Nafcillin, Rifampin, Gent Normal   Final   Culture  Setup Time     Final   Value: 11/04/2013 10:58     Performed at [facility]     Final   Value: NO GROWTH     Performed at [facility]   Culture     Final   Value: NO GROWTH     Performed at [facility]   Report Status 11/05/2013 FINAL   Final    Studies/Results: Dg Chest 2 View  11/07/2013   CLINICAL DATA:  Aortic valve disease  EXAM: CHEST  2 VIEW  COMPARISON:  November 06, 2013  FINDINGS: Dual-lumen catheter tip is in the right atrium. The left jugular catheter tip is probably in the left second intercostal vein, stable. No pneumothorax.  There is consolidation in the left lower lobe with left effusion. Right lung is clear. Heart is enlarged with normal pulmonary vascularity.  IMPRESSION: Catheter positions are unchanged. No the unusual positioning of the left jugular catheter; the tip is probably in the superior left intercostal vein.  There is stable cardiomegaly. There is airspace consolidation in the left lower lobe with left effusion. Right lung is clear.   Electronically Signed   By: Lowella Grip M.D.   On: 11/07/2013 08:00   Ct Head W Wo Contrast  11/07/2013   CLINICAL DATA:  58 year old male with altered mental status and abnormal MRI findings raising the possibility of septic emboli. Initial encounter.  EXAM: CT HEAD WITHOUT AND WITH  CONTRAST  TECHNIQUE: Contiguous axial images were obtained from the base of the skull through the vertex without and with intravenous contrast  CONTRAST:  29mL OMNIPAQUE IOHEXOL 300 MG/ML  SOLN  COMPARISON:  Brain MRI 09/09/2013.  Head CT 09/12/2013.  FINDINGS: Stable paranasal sinuses and mastoids, mild ethmoid mucosal thickening. No acute osseous abnormality identified. Visualized orbits and scalp soft tissues are within normal limits.  Stable cerebral volume. No ventriculomegaly. No midline shift, mass effect, or evidence of intracranial mass lesion. No acute or hyperdense intracranial hemorrhage identified. The largest of the abnormal areas by MRI in February correspond to small hypodense areas of encephalomalacia. No cerebral edema or associated mass effect is evident. No superimposed acute cortically based infarct. Following contrast, no abnormal parenchymal enhancement. Curvilinear enhancement in the left superior hemisphere associated with the small cortical defect there might be a partially thrombosis enhancing vein. The visualized large dural venous sinuses and internal jugular bulbs are normally enhancing and patent.  IMPRESSION: 1. No new intracranial abnormality identified. 2. Scattered small foci of encephalomalacia corresponding to the largest lesions detected by MRI and February. No acute or hyperdense blood products. No intracranial mass effect. There might be a cortical vein abnormality associated with  the lesion in the superior left parietal lobe.   Electronically Signed   By: Lars Pinks M.D.   On: 11/07/2013 07:58   Dg Chest Port 1 View  11/06/2013   CLINICAL DATA:  Postop, chest tube check  EXAM: PORTABLE CHEST - 1 VIEW  COMPARISON:  DG CHEST 1V PORT dated 11/04/2013; DG CHEST 1V PORT dated 11/03/2013; DG CHEST 1V PORT dated 11/02/2013; DG CHEST 1V PORT dated 11/01/2013; CT HEART MORPH/PULM VEIN W/CM&W/O CA SCORE dated 10/26/2013; DG CHEST 1V PORT dated 4/3/2  FINDINGS: Grossly unchanged enlarged  cardiac silhouette and mediastinal contours post median sternotomy. Stable positioning of support apparatus including left-sided catheter overlying the lateral aspect of the aortic arch, likely within the aortic nipple (left superior intercostal vein). Otherwise, stable position of support apparatus. Minimally improved aeration of the lungs with increase conspicuity of the pulmonary vasculature. Grossly unchanged small left-sided effusion associated left basilar heterogeneous / consolidative opacities. No new focal airspace opacities. Grossly unchanged bones.  IMPRESSION: 1. Stable positioning of support apparatus including left-sided catheter overlying the expected location of the aortic nipple (left superior intercostal vein). Nose, this has been reported on multiple prior examinations. Again, repositioning is advised. 2. Findings suggestive of improved pulmonary edema and atelectasis. 3. Unchanged small left-sided effusion and left basilar/retrocardiac opacities, likely atelectasis   Electronically Signed   By: Sandi Mariscal M.D.   On: 11/06/2013 07:44      Assessment/Plan: DEBRON FRITCH is a 58 y.o. 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  CT brain looks very good by report. I would like to review on Monday with NEuroradiology and Lars Pinks but I feel that these imaging findings make it rea--assuring when we make change from IV nafcillin to IV ancef with HD  For now until I have had a chance to review these images with Neuroradiology on Monday would still continue him on IV nafcillin  Continue high  dose Rifampin 300mg  IV q 8 hours given fresh prosthetic heart valve, this can very  soon be changed to oral rifampin 300 mg po TID  THESE BETA LACTAMS + RIFAMPIN FOR TOTAL OF   X 8  Weeks post OP  And Gentamicin for 2 weeks.  #2 Seeing black spots" as above CT of brain was very encouraging.  Dr. Baxter Flattery will be covering this weekend and is available for questions. I will be back on Monday        LOS: 16 days   Truman Hayward 11/07/2013, 7:48 PM

## 2013-11-07 NOTE — Progress Notes (Addendum)
[address]       [place] [address]             [phone]      10 Days Post-Op Procedure(s) (LRB): REDO STERNOTOMY (N/A) REDO BENTALL PROCEDURE, debridment of aoritc root abscess, replacement of aortic root, ascending aorta and aortic valve with homograft. Insertion of left femoral arterial line (N/A) INTRAOPERATIVE TRANSESOPHAGEAL ECHOCARDIOGRAM (N/A)  Subjective:  Bradley [name] again complains of not feeling well.  He is crying and states that he had a very bad night.  He states that the night nursing staff was very rude to him and argumentative.  He states that he is a very sick person and it's their job to help him.  He states that his night nurse was unwilling and came into the room and argued with him.  He states that he is nauseated this morning because the incident last night made him sick.  Objective: Vital signs in last 24 hours: Temp:  [97.6 F (36.4 C)-98.9 F (37.2 C)] 98.3 F (36.8 C) (04/10 0415) Pulse Rate:  [64-112] 90 (04/10 0415) Cardiac Rhythm:  [-] Normal sinus rhythm (04/09 2102) Resp:  [12-34] 20 (04/10 0415) BP: (100-118)/(63-85) 108/78 mmHg (04/10 0415) SpO2:  [90 %-99 %] 96 % (04/10 0415) Weight:  [200 lb 13.4 oz (91.1 kg)-205 lb 11 oz (93.3 kg)] 203 lb 6.4 oz (92.262 kg) (04/10 0415)  Intake/Output from previous day: 04/09 0701 - 04/10 0700 In: 300 [P.O.:120; I.V.:80; IV Piggyback:100] Out: 2500   General appearance: alert, cooperative and no distress Heart: regular rate and rhythm Lungs: clear to auscultation bilaterally Abdomen: soft, non-tender; bowel sounds normal; no masses,  no organomegaly Extremities: edema 1-2+ pitting Wound: clean and dry  Lab Results:  Recent Labs  11/06/13 0350 11/07/13 0415  WBC 8.8 7.4  HGB 8.1* 8.5*  HCT 23.9* 25.1*  PLT 216 245   BMET:  Recent Labs  11/06/13 0350 11/07/13 0415  NA 139 138  K 3.7 3.8  CL 97 97  CO2 27 27  GLUCOSE 87 88  BUN 12 10  CREATININE 3.14* 3.30*    CALCIUM 7.8* 8.2*    PT/INR: No results found for this basename: LABPROT, INR,  in the last 72 hours ABG    Component Value Date/Time   PHART 7.515* 10/31/2013 1342   HCO3 25.6* 10/31/2013 1342   TCO2 27 10/31/2013 1342   ACIDBASEDEF 1.0 10/29/2013 1536   O2SAT 99.0 10/31/2013 1342   CBG (last 3)   Recent Labs  11/06/13 1607 11/06/13 2128 11/07/13 0608  GLUCAP 150* 96 86    Assessment/Plan: S/P Procedure(s) (LRB): REDO STERNOTOMY (N/A) REDO BENTALL PROCEDURE, debridment of aoritc root abscess, replacement of aortic root, ascending aorta and aortic valve with homograft. Insertion of left femoral arterial line (N/A) INTRAOPERATIVE TRANSESOPHAGEAL ECHOCARDIOGRAM (N/A)  1.  CV- good rate and pressure control- continue Amiodarone 2. Pulm- no acute issues, encouraged use of IS 3. Renal- ESRD, Nephrology following, HD today 4. GI- continued nausea, continue Zofran prn 5. Major Depressive Disorder- psych has evaluated patient continue Clonopin, Remeron, Zoloft 6. Endocarditis- ID following, continue ABX 7. Neuro: patient seeing "black spots"- CT scan head complete and showed no acute events 8. Dispo- patient with continued nausea, HD today, continue current care  LOS: 16 days   Erin Barrett 11/07/2013  Dg Chest 2 View  11/07/2013   CLINICAL DATA:  Aortic valve disease  EXAM: CHEST  2 VIEW  COMPARISON:  November 06, 2013  FINDINGS: Dual-lumen catheter tip is in the right atrium. The left jugular catheter tip is probably in the left second intercostal vein, stable. No pneumothorax.  There is consolidation in the left lower lobe with left effusion. Right lung is clear. Heart is enlarged with normal pulmonary vascularity.  IMPRESSION: Catheter positions are unchanged. No the unusual positioning of the left jugular catheter; the tip is probably in the superior left intercostal vein.  There is stable cardiomegaly. There is airspace consolidation in the left lower lobe with left effusion. Right lung is  clear.   Electronically Signed   By: Lowella Grip M.D.   On: 11/07/2013 08:00   Ct Head W Wo Contrast  11/07/2013   CLINICAL DATA:  58 year old male with altered mental status and abnormal MRI findings raising the possibility of septic emboli. Initial encounter.  EXAM: CT HEAD WITHOUT AND WITH CONTRAST  TECHNIQUE: Contiguous axial images were obtained from the base of the skull through the vertex without and with intravenous contrast  CONTRAST:  52mL OMNIPAQUE IOHEXOL 300 MG/ML  SOLN  COMPARISON:  Brain MRI 09/09/2013.  Head CT 09/12/2013.  FINDINGS: Stable paranasal sinuses and mastoids, mild ethmoid mucosal thickening. No acute osseous abnormality identified. Visualized orbits and scalp soft tissues are within normal limits.  Stable cerebral volume. No ventriculomegaly. No midline shift, mass effect, or evidence of intracranial mass lesion. No acute or hyperdense intracranial hemorrhage identified. The largest of the abnormal areas by MRI in February correspond to small hypodense areas of encephalomalacia. No cerebral edema or associated mass effect is evident. No superimposed acute cortically based infarct. Following contrast, no abnormal parenchymal enhancement. Curvilinear enhancement in the left superior hemisphere associated with the small cortical defect there might be a partially thrombosis enhancing vein. The visualized large dural venous sinuses and internal jugular bulbs are normally enhancing and patent.  IMPRESSION: 1. No new intracranial abnormality identified. 2. Scattered small foci of encephalomalacia corresponding to the largest lesions detected by MRI and February. No acute or hyperdense blood products. No intracranial mass effect. There might be a cortical vein abnormality associated with the lesion in the superior left parietal lobe.   Electronically Signed   By: Lars Pinks M.D.   On: 11/07/2013 07:58   I have seen and examined Bradley [name] and agree with the above assessment  and  plan.  [name] MD [facility] [phone] Office [phone] 11/07/2013 5:27 PM

## 2013-11-08 LAB — GLUCOSE, CAPILLARY: Glucose-Capillary: 101 mg/dL — ABNORMAL HIGH (ref 70–99)

## 2013-11-08 MED ORDER — HEPARIN SODIUM (PORCINE) 1000 UNIT/ML DIALYSIS: 1000.0000 [IU] | INTRAMUSCULAR | Status: DC | PRN

## 2013-11-08 MED ORDER — HEPARIN SODIUM (PORCINE) 1000 UNIT/ML DIALYSIS: 2000.0000 [IU] | INTRAMUSCULAR | Status: DC | PRN

## 2013-11-08 MED ORDER — PENTAFLUOROPROP-TETRAFLUOROETH EX AERO: 1.0000 "application " | INHALATION_SPRAY | CUTANEOUS | Status: DC | PRN

## 2013-11-08 MED ORDER — ALPRAZOLAM 0.5 MG PO TABS
ORAL_TABLET | ORAL | Status: AC
  Administered 2013-11-08: 0.5 mg
  Filled 2013-11-08: qty 1

## 2013-11-08 MED ORDER — LIDOCAINE-PRILOCAINE 2.5-2.5 % EX CREA: 1.0000 "application " | TOPICAL_CREAM | CUTANEOUS | Status: DC | PRN

## 2013-11-08 MED ORDER — SODIUM CHLORIDE 0.9 % IV SOLN: 100.0000 mL | INTRAVENOUS | Status: DC | PRN

## 2013-11-08 MED ORDER — ALPRAZOLAM 0.5 MG PO TABS: 0.5000 mg | ORAL_TABLET | Freq: Once | ORAL | Status: DC

## 2013-11-08 MED ORDER — NEPRO/CARBSTEADY PO LIQD: 237.0000 mL | ORAL | Status: DC | PRN

## 2013-11-08 MED ORDER — GENTAMICIN IN SALINE 1-0.9 MG/ML-% IV SOLN
100.0000 mg | Freq: Once | INTRAVENOUS | Status: AC
  Administered 2013-11-08: 100 mg via INTRAVENOUS
  Filled 2013-11-08: qty 100

## 2013-11-08 MED ORDER — LIDOCAINE HCL (PF) 1 % IJ SOLN: 5.0000 mL | INTRAMUSCULAR | Status: DC | PRN

## 2013-11-08 MED ORDER — NEPRO/CARBSTEADY PO LIQD
237.0000 mL | ORAL | Status: DC | PRN
  Filled 2013-11-08: qty 237

## 2013-11-08 MED ORDER — LIDOCAINE-PRILOCAINE 2.5-2.5 % EX CREA
1.0000 "application " | TOPICAL_CREAM | CUTANEOUS | Status: DC | PRN
  Filled 2013-11-08: qty 5

## 2013-11-08 MED ORDER — ALTEPLASE 2 MG IJ SOLR: 2.0000 mg | Freq: Once | INTRAMUSCULAR | Status: DC | PRN

## 2013-11-08 MED ORDER — ALTEPLASE 2 MG IJ SOLR
2.0000 mg | Freq: Once | INTRAMUSCULAR | Status: DC | PRN
  Filled 2013-11-08: qty 2

## 2013-11-08 NOTE — Progress Notes (Signed)
Subjective: 2.5kg off w HD yest, BP's are better, off pressors  Filed Vitals:   11/07/13 1720 11/07/13 2029 11/08/13 0454 11/08/13 0456  BP: 108/79 92/68  110/76  Pulse: 86 99  88  Temp: 97.5 F (36.4 C) 98.5 F (36.9 C)  98.1 F (36.7 C)  TempSrc: Oral Oral  Oral  Resp: 20 18  18   Height:      Weight: 90.6 kg (199 lb 11.8 oz)  90.13 kg (198 lb 11.2 oz)   SpO2: 100% 93%  98%   Exam: No distress +JVD Chest clear on R, dec'd L base RRR no MRG Abd soft, obese NTND 3+ LUE edema, 1+ pretibial edema bilat LE's Neuro is nf, Ox3 LFA AVF patent, R IJ Diatek  CXR 4/9 - edema resolved, L effusion persists  Dialysis: TTS [facility] 4h   86kg   3K/2.25 [place]   R IJ Cath / maturing LFA AVF (placed 3/11)  Heparin 8600  Hectorol none     Epo none Recent lab: Hb 10.1 ferritin 836  pth 175   Assessment: 1 Prosthetic valve endocarditis / root abcess, had redo AVR 3/31 - IV abx per ID 2 Vol excess- improving, close to resolved 3 ESRD - did not recover from AKI in Feb, 1st HD was 09/17/12 4 L arm AV fistula- patent, placed on 3/11 5 Anemia resumed darbe 100/wk, Hb 8.9, check tsat 6 Hypotension- new problem, started midodrine 10 bid   Plan- HD today and Monday, then resume TTS schedule    Kelly Splinter MD  pager [phone]    cell [phone]  11/08/2013, 10:32 AM     Recent Labs Lab 11/05/13 0435 11/06/13 0350 11/07/13 0415  NA 139 139 138  K 3.6* 3.7 3.8  CL 99 97 97  CO2 26 27 27   GLUCOSE 91 87 88  BUN 16 12 10   CREATININE 3.45* 3.14* 3.30*  CALCIUM 7.7* 7.8* 8.2*    Recent Labs Lab 11/04/13 0345 11/05/13 0435 11/06/13 0350  AST 48* 37 33  ALT 27 21 17   ALKPHOS 77 73 76  BILITOT 0.8 0.8 0.6  PROT 5.0* 5.2* 5.1*  ALBUMIN 1.6* 1.8* 1.6*    Recent Labs Lab 11/05/13 0435 11/06/13 0350 11/07/13 0415  WBC 8.6 8.8 7.4  HGB 8.4* 8.1* 8.5*  HCT 25.2* 23.9* 25.1*  MCV 84.3 84.2 85.7  PLT 202 216 245   . amiodarone  200 mg Oral Daily  . aspirin EC  81 mg Oral  Daily  . chlorhexidine  15 mL Mouth Rinse BID  . clonazePAM  0.5 mg Oral BID  . darbepoetin (ARANESP) injection - DIALYSIS  100 mcg Intravenous Q Tue-HD  . docusate sodium  200 mg Oral Daily  . feeding supplement (NEPRO CARB STEADY)  237 mL Oral BID BM  . ferrous Q000111Q C-folic acid  1 capsule Oral q morning - 10a  . gentamicin  100 mg Intravenous Q M,W,F-1800  . levothyroxine  200 mcg Oral QAC breakfast  . midodrine  10 mg Oral BID WC  . mirtazapine  15 mg Oral QHS  . nafcillin IV  2 g Intravenous 6 times per day  . pantoprazole  40 mg Oral QAC breakfast  . rifampin  300 mg Oral 3 times per day  . sertraline  50 mg Oral Daily  . sodium chloride  3 mL Intravenous Q12H   . sodium chloride 20 mL/hr at 11/04/13 2146   sodium chloride, albumin human, ALPRAZolam, bisacodyl, bisacodyl, ondansetron (ZOFRAN) IV,  ondansetron, sodium chloride

## 2013-11-08 NOTE — Procedures (Signed)
I was present at this dialysis session, have reviewed the session itself and made  appropriate changes  Kelly Splinter MD (pgr) [phone]    [phone] 11/08/2013, 3:28 PM

## 2013-11-08 NOTE — Progress Notes (Addendum)
      [address]       [place] [address]             [phone]      11 Days Post-Op Procedure(s) (LRB): REDO STERNOTOMY (N/A) REDO BENTALL PROCEDURE, debridment of aoritc root abscess, replacement of aortic root, ascending aorta and aortic valve with homograft. Insertion of left femoral arterial line (N/A) INTRAOPERATIVE TRANSESOPHAGEAL ECHOCARDIOGRAM (N/A)  Subjective:  Bradley [name] is doing well today.  He states he is tired, but is otherwise doing okay.  Objective: Vital signs in last 24 hours: Temp:  [97.5 F (36.4 C)-98.5 F (36.9 C)] 98.1 F (36.7 C) (04/11 0456) Pulse Rate:  [85-99] 88 (04/11 0456) Cardiac Rhythm:  [-] Normal sinus rhythm (04/10 2000) Resp:  [17-20] 18 (04/11 0456) BP: (92-123)/(66-90) 110/76 mmHg (04/11 0456) SpO2:  [93 %-100 %] 98 % (04/11 0456) Weight:  [198 lb 11.2 oz (90.13 kg)-204 lb 2.3 oz (92.6 kg)] 198 lb 11.2 oz (90.13 kg) (04/11 0454)  Intake/Output from previous day: 04/10 0701 - 04/11 0700 In: 120 [P.O.:120] Out: 2543   General appearance: alert, cooperative and no distress Heart: regular rate and rhythm Lungs: clear to auscultation bilaterally Abdomen: soft, non-tender; bowel sounds normal; no masses,  no organomegaly Extremities: edema 1+ R>L Wound: clean and dry  Lab Results:  Recent Labs  11/06/13 0350 11/07/13 0415  WBC 8.8 7.4  HGB 8.1* 8.5*  HCT 23.9* 25.1*  PLT 216 245   BMET:  Recent Labs  11/06/13 0350 11/07/13 0415  NA 139 138  K 3.7 3.8  CL 97 97  CO2 27 27  GLUCOSE 87 88  BUN 12 10  CREATININE 3.14* 3.30*  CALCIUM 7.8* 8.2*    PT/INR: No results found for this basename: LABPROT, INR,  in the last 72 hours ABG    Component Value Date/Time   PHART 7.515* 10/31/2013 1342   HCO3 25.6* 10/31/2013 1342   TCO2 27 10/31/2013 1342   ACIDBASEDEF 1.0 10/29/2013 1536   O2SAT 99.0 10/31/2013 1342   CBG (last 3)   Recent Labs  11/07/13 1130 11/07/13 2246 11/08/13 0621  GLUCAP 98 98 101*     Assessment/Plan: S/P Procedure(s) (LRB): REDO STERNOTOMY (N/A) REDO BENTALL PROCEDURE, debridment of aoritc root abscess, replacement of aortic root, ascending aorta and aortic valve with homograft. Insertion of left femoral arterial line (N/A) INTRAOPERATIVE TRANSESOPHAGEAL ECHOCARDIOGRAM (N/A)  1. CV- good rate and pressure control- on Amiodarone 2. Pulm- no acute issue, continue IS 3. Renal- ESRD, Nephrology following 4. Endocarditis- on Nafcillin and Rifampin, ID following 5. Major Depressive Disorder- continue Clonopin, Remeron, Zoloft per psych 6. Dispo- patient stable today, ID to review CT scan with Neuro Radiology Monday, patient making good progress, possibly ready for CIR early next week    LOS: 17 days    Ellwood Handler 11/08/2013  Plan rehab Monday I have seen and examined Bradley [name] and agree with the above assessment  and plan.  [name] MD [facility] [phone] Office [phone] 11/08/2013 10:15 AM

## 2013-11-09 MED ORDER — MIDODRINE HCL 5 MG PO TABS
5.0000 mg | ORAL_TABLET | Freq: Two times a day (BID) | ORAL | Status: DC
  Administered 2013-11-09 – 2013-11-13 (×7): 5 mg via ORAL
  Filled 2013-11-09 (×10): qty 1

## 2013-11-09 NOTE — Clinical Social Work Placement (Signed)
Clinical Social Work Department CLINICAL SOCIAL WORK PLACEMENT NOTE 11/09/2013  Patient:  Bradley [name], Bradley [name]  Account Number:  [id number] [place] date:  10/22/2013  Clinical Social Worker:  Carrington Clamp, [place]  Date/time:  11/09/2013 04:58 PM  Clinical Social Work is seeking post-discharge placement for this patient at the following level of care:   [name]   (*CSW will update this form in Epic as items are completed)   11/09/2013  Patient/family provided with [place] Department of Clinical Social Work's list of facilities offering this level of care within the geographic area requested by the patient (or if unable, by the patient's family).  11/09/2013  Patient/family informed of their freedom to choose among providers that offer the needed level of care, that participate in Medicare, Medicaid or managed care program needed by the patient, have an available bed and are willing to accept the patient.  11/09/2013  Patient/family informed of [facility]' ownership interest in [facility], as well as of the fact that they are under no obligation to receive care at this facility.  PASARR submitted to [employer] on  PASARR number received from [facility] on   FL2 transmitted to all facilities in geographic area requested by pt/family on  11/09/2013 FL2 transmitted to all facilities within larger geographic area on   Patient informed that his/her managed care company has contracts with or will negotiate with  certain facilities, including the following:     Patient/family informed of bed offers received:   Patient chooses bed at  Physician recommends and patient chooses bed at    Patient to be transferred to  on   Patient to be transferred to facility by   The following physician request were entered in Epic:   Additional Comments:  Chiropodist, [place] Weekend Clinical Social Worker [phone]

## 2013-11-09 NOTE — Progress Notes (Signed)
dc'ed pacing wires per unit protocol pt. Tolerated well

## 2013-11-09 NOTE — Discharge Summary (Signed)
Physician Discharge Summary  Patient ID: Bradley [name] MRN: [id number] DOB/AGE: 57/25/1957 57 y.o.  Admit date: 10/22/2013 Discharge date: 11/09/2013     Admission Diagnoses:  Patient Active Problem List   Diagnosis Date Noted  . Chronic combined systolic and diastolic CHF (congestive heart failure) 10/22/2013  . Staphylococcus aureus bacteremia   . Prosthetic valve endocarditis   . ESRD on dialysis   . ST elevation (STEMI) myocardial infarction 09/17/2013  . Acute respiratory alkalosis 09/13/2013  . Acute renal failure 09/13/2013  . Encephalopathy acute 09/13/2013  . Staphylococcus aureus bacteremia with sepsis 09/13/2013  . Agitation 09/12/2013  . Acute diastolic heart failure [id number]  . NSTEMI (non-ST elevated myocardial infarction) 09/11/2013  . Sepsis 09/09/2013  . Altered mental state 09/09/2013  . Encephalitis 09/09/2013  . Septic embolism 09/09/2013  . HCAP (healthcare-associated pneumonia) 09/09/2013  . 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 Hypothyroidism, postsurgical   . Influenza 09/06/2013  . Major depressive disorder, recurrent, severe with psychotic features- hospitalized in Jan 2013 08/20/2011  . Generalized anxiety disorder 08/20/2011  . Hypotension 08/17/2011  . Suicidal ideation 08/17/2011  . Depression 05/05/2011  . LBBB (left bundle branch block) 05/05/2011  . Rotator cuff tear, right 02/17/2011  . Aortic valve replaced- Bentall procedure 2011 12/29/2010  . Hypertension 12/29/2010  . DEPRESSION 02/14/2010  . WEIGHT LOSS 02/14/2010  . HYPOTHYROIDISM- TSH 7.5 03/05/2009  . CHEST PAIN, EXERTIONAL 03/05/2009    Discharge Diagnoses:   Patient Active Problem List   Diagnosis Date Noted  . S/P AVR (aortic valve replacement) and aortoplasty 10/28/2013  . Chronic combined systolic and diastolic CHF (congestive heart failure) 10/22/2013  . Staphylococcus aureus bacteremia   . Prosthetic valve endocarditis   . ESRD on dialysis   . ST elevation  (STEMI) myocardial infarction 09/17/2013  . Acute respiratory alkalosis 09/13/2013  . Acute renal failure 09/13/2013  . Encephalopathy acute 09/13/2013  . Staphylococcus aureus bacteremia with sepsis 09/13/2013  . Agitation 09/12/2013  . Acute diastolic heart failure [id number]  . NSTEMI (non-ST elevated myocardial infarction) 09/11/2013  . Sepsis 09/09/2013  . Altered mental state 09/09/2013  . Encephalitis 09/09/2013  . Septic embolism 09/09/2013  . HCAP (healthcare-associated pneumonia) 09/09/2013  . 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 Hypothyroidism, postsurgical   . Influenza 09/06/2013  . Major depressive disorder, recurrent, severe with psychotic features- hospitalized in Jan 2013 08/20/2011  . Generalized anxiety disorder 08/20/2011  . Hypotension 08/17/2011  . Suicidal ideation 08/17/2011  . Depression 05/05/2011  . LBBB (left bundle branch block) 05/05/2011  . Rotator cuff tear, right 02/17/2011  . Aortic valve replaced- Bentall propceedure 2011 12/29/2010  . Hypertension 12/29/2010  . DEPRESSION 02/14/2010  . WEIGHT LOSS 02/14/2010  . HYPOTHYROIDISM- TSH 7.5 03/05/2009  . CHEST PAIN, EXERTIONAL 03/05/2009   Discharged Condition: good     History of Present Illness:   [name] is a 58 yo male with known long standing history of Aortic Valve Disease.  He is S/P Aortic Valvulotomy performed when he was 11.  He later developed an Aortic Aneurysm, for which he underwent Bentall procedure with a tissue Aortic Valve prosthesis.  This was done by [name] in 2011.  The patient unfortunately developed Sepsis with MSSA.  This lead to endocarditis of his current Aortic Valve with septic emboli to his brain.  He was treated with Gentamicin, which unfortunately caused the patient to develop Renal Failure requiring dialysis.  The patient got admitted to the hospital in March of this  year with complaints of left sided sternoclavicular joint pain.  There was concern for  possible osteomyelitis.  TCTS was consulted for possible assistance in his care.  He was initially evaluated by [name] on 10/23/2013, at which time it was felt the patient would need a cardiac CT scan to fully assess his aortic and valve prosthesis. This was performed and showed evidence of a possible perivalvular abscess, and further workup with TEE was indicated.  This was performed and confirmed the presence of a perivalvular abscess measuring 14 x 52 mm around the non- coronary and right coronary cusp.  After review of these imaging studies [name] felt the patient would likely require a third time Redo Bentall procedure.  It was felt this procedure would be high risk given the patient's redo status and renal failure.  The risks and benefits of the procedure were explained to the patient and his wife and he was agreeable to proceed.             Hospital Course:   Throughout the patient's hospital course, he has been closely monitored by Nephrology and Infectious Disease.  He was taken to the operating room on 10/28/2013.  He underwent Redo Sternotomy with Redo Bentall Procedure with Homograft and debridement of root abscess.  The patient tolerated the procedure and was taken to the SICU in stable but critical condition.  During the patient's stay in the SICU, he was weaned off ventilator support and extubated on POD #1.  However he did developed respiratory insufficiency and required reintubation.  He was followed closely by Nephrology who started dialysis on POD #2.  The patient was given packed red cells for acute blood loss anemia.  The patient was again weaned and extubated on POD #3.  This extubation was well tolerated by the patient.  He was weaned of Amiodarone which was started in the operating room for Vtach/V.Fib in the operating room.  He was also weaned off Dopamine, Levophed, and Neo Synephrine as tolerated.  His chest tubes and arterial lines were removed without difficulty.  The  patient developed diarrhea and all stool softeners were stopped.  He was ruled out for C. Diff.  He was maintaining NSR and tolerating an oral regimen of Amiodarone.  The patient has a history of major depressive disorder.  He was experiencing a lot of anxiety and very emotional in ICU.  Psychiatry consult was obtained and recommended resumption of patients home medications of Clonopin, Zoloft, and Remeron.  The patient's symptoms improved with use of these medications.  The patient had issues with hypotension during dialysis.  He was requiring constant use of Dopamine.  He was placed on Midodrine with good results and ultimately the Dopamine was able to be discontinued.  The patient developed some pain and swelling in the left upper extremity.  Vascular surgery consult was obtained to assess the fistula placed by Dr. Kellie Simmering prior to admission.  Venous duplex ruled out the presence of DVT.  The patient continued to progress.  He did have nausea that was treated with zofran.  He was tolerating dialysis regimen and was felt stable enough to be transferred to the step down unit in stable condition.  The patient continues to do well.  He has been feeling better every day.  He is ambulating independently.  He continues to tolerate dialysis and will hopefully be able to resume his outpatient regimen of Tuesday, Thursday, Saturday soon.  He is maintaining NSR and his pacing wires have  been removed without difficulty.  The patient developed "black spots" in his visual field.  CT scan of the head was obtained and did not show evidence of an acute intracranial process.  During the entire admission the patient has been closely followed by Infectious disease.  He has been switched to a regimen of IV Ancef at dialysis and oral Rifampin, both to continue until 6/3, as well as Gentamycin to be discontinued on 4/14.  The patient has been evaluated by Physical Therapy and is progressing well with mobility.  He is overall  progressing well, and we anticipate discharge home with home health services in the next 24-48 hours.     Consults: ID, nephrology, rehabilitation medicine, psychiatry and vascular surgery    Significant Diagnostic Studies:  CT Scan Chest:   1. 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 Recommended gated cardiac CT or  echocardiography.  2. No evidence of septic arthritis or osteomyelitis to explain  sternoclavicular tenderness.  3. 42 mm diameter aorta just distal to the aortic graft. Diameter  stable from 2013 MRI.  TEE: - Left ventricle: The cavity size was mildly dilated. Systolic function was mildly reduced. The estimated ejection fraction was in the range of 45% to 50%. - Aortic valve: S/P AVR with bioprosthesis and aortic root replacement (Bentall procedure). Aortic valve leaflets are thickened and calcified with mildly limited opening. There is a large echolucid area surrounding right and non-coronary aortic valve leaflet measuring at least 14 x 52 mm. The inflow is most probably at the level of right coronary leaflet or slightly bellow with visible flow around the valve. The flow is pushing the proximal portions of Bentall graft inward in systole. This is highly suspicious for a perivalvular abscess. The paravalvular fibrosa is significantly thickened and involves part of the abscess. - Left atrium: The atrium was dilated. No evidence of thrombus in the atrial cavity or appendage. No evidence of thrombus in the atrial cavity or appendage. No evidence of thrombus in the atrial cavity or appendage. - Right atrium: No evidence of thrombus in the atrial cavity or appendage. No evidence of thrombus in the atrial cavity or appendage. - Atrial septum: No defect or patent foramen ovale was identified. Echo contrast study showed no right-to-left atrial level shunt, following an increase in RA pressure induced by provocative  maneuvers. - Pulmonic valve: No evidence of vegetation. Recommendations: A large paraaortic abscess around the root of the bioprosthetic aortic valve and proximal portion of the Bentall graft. A contrast gated chest CT is recommended.   Treatments: antibiotics: gentamycin, nafcillin and Rifampin, dialysis: Hemodialysis    Surgery: 10/28/2013  Redo sternotomy, redo Bentall replacement of aortic valve, aortic root, and ascending aorta with reimplantation of coronary arteries using a homograft with debridement of aortic valve abscess, placement of left femoral arterial line     Discharge medications:    Medication List         acetaminophen 500 MG tablet  Commonly known as:  TYLENOL  Take 500 mg by mouth every 6 (six) hours as needed for mild pain or fever.     amiodarone 200 MG tablet  Commonly known as:  PACERONE  Take 1 tablet (200 mg total) by mouth daily.     aspirin EC 81 MG tablet  Take 81 mg by mouth every other day.     ceFAZolin 2-3 GM-% Solr  Commonly known as:  ANCEF  Inject 50 mLs (2 g total) into the  vein every Monday, Wednesday, and Friday with hemodialysis. Last dose= 12/31/2013     clonazePAM 1 MG tablet  Commonly known as:  KLONOPIN  Take 1 mg by mouth 2 (two) times daily as needed for anxiety.     feeding supplement (NEPRO CARB STEADY) Liqd  Take 237 mLs by mouth 2 (two) times daily between meals.     ferrous Q000111Q C-folic acid capsule  Commonly known as:  TRINSICON / FOLTRIN  Take 1 capsule by mouth daily.     gentamicin 1-0.9 MG/ML-%  Commonly known as:  GARAMYCIN  X 1 dose at dialysis on Saturday     levothyroxine 200 MCG tablet  Commonly known as:  SYNTHROID, LEVOTHROID  Take 200 mcg by mouth daily before breakfast.     midodrine 5 MG tablet  Commonly known as:  PROAMATINE  Take 1 tablet (5 mg total) by mouth 2 (two) times daily with a meal.     mirtazapine 15 MG disintegrating tablet  Commonly known as:  REMERON SOL-TAB   Take 1 tablet (15 mg total) by mouth at bedtime.     rifampin 300 MG capsule  Commonly known as:  RIFADIN  Take 1 capsule (300 mg total) by mouth 3 (three) times daily. Last doses= 01/01/2104     sertraline 50 MG tablet  Commonly known as:  ZOLOFT  Take 50 mg by mouth daily.     sevelamer carbonate 800 MG tablet  Commonly known as:  RENVELA  Take 800-1,600 mg by mouth 3 (three) times daily with meals. Takes 1600mg  with larger amounts of food and 800mg  with smaller amounts of food        The patient has been discharged on:   1.Beta Blocker:  Yes [   ]                              No   [x   ]                              If No, reason: labile blood pressure  2.Ace Inhibitor/ARB: Yes [   ]                                     No  [  x  ]                                     If No, reason: ESRD  3.Statin:   Yes [   ]                  No  [x  ]                  If No, reason: NO CAD  4.Shela Commons:  Yes  [ x  ]                  No   [   ]                  If No, reason:    Follow Up:  Follow-up Information   Follow up with [name], MD On 12/11/2013. (Have a chest x-ray at [place] at 9:00, then see  MD at 10:00)    Specialty:  Cardiothoracic Surgery   Contact information:   [place] [address] [place] [address] [phone]       Follow up with Alcide Evener, MD. (Infectious Disease office will arrange follow up appointment)    Specialty:  Infectious Diseases   Contact information:   [address] [place] [address] [phone]       Follow up with Elam Dutch, MD. (Office will arrange follow up to re-evaluate fistula)    Specialty:  Vascular Surgery   Contact information:   [address] [place] [address] [phone]       Follow up with Jenkins Rouge, MD In 2 weeks. (Office will schedule cardiology follow up)    Specialty:  Cardiology   Contact information:   [id number] [place] [address] [phone]         Signed: Ellwood Handler 11/09/2013, 9:17 AM

## 2013-11-09 NOTE — Progress Notes (Signed)
Patient ambulated 300 ft, with rolling walker on room air. Patient tolerated walk well with no sob or complaint of pain.

## 2013-11-09 NOTE — Progress Notes (Addendum)
Subjective: 2.9 kg off yest w HD, wt down 88kg, no complaints  Filed Vitals:   11/08/13 1641 11/08/13 1948 11/09/13 0422 11/09/13 0437  BP: 122/84 105/71 107/69   Pulse: 94 99 88   Temp: 98.1 F (36.7 C) 97.6 F (36.4 C) 98.3 F (36.8 C)   TempSrc: Oral Oral Oral   Resp: 18 17 18    Height:      Weight: 88 kg (194 lb 0.1 oz)   88.2 kg (194 lb 7.1 oz)  SpO2: 99% 94% 96%    Exam: No distress No JVD Chest clear on R, dec'd L base RRR no MRG Abd soft, obese NTND LE edema mostly resolved, still LUE edema 2+ Neuro is nf, Ox3 LFA AVF patent, R IJ Diatek  CXR 4/9 - edema resolved, L effusion persists  Dialysis: TTS [facility] 4h   86kg   3K/2.25 [place]   R IJ Cath / maturing LFA AVF (placed 3/11)  Heparin 8600  Hectorol none     Epo none Recent lab: Hb 10.1 ferritin 836  pth 175   Assessment: 1 Prosthetic valve endocarditis / root abcess, had redo AVR 3/31 - on IV nafcillin, po rifampin and IV gent for 2 wks (started 4/2 > thru 4/16 getting 100 mg IV tiw with HD); nafcillin will have to be changed to Ancef or Vanc at discharge to get at dialysis otherwise he will need PICC line to get IV nafcillin at home (would need "central" PICC per IR) 2 Vol excess- improving 3 ESRD - did not recover from AKI in Feb, 1st HD was 09/17/12 4 L arm AV fistula- patent, placed on 3/11 5 Anemia resumed darbe 100/wk, Hb 8.9, check tsat 6 Hypotension- new problem, started midodrine 10 bid   Plan- HD tomorrow then resume TTS schedule, wean down midodrine to 5 bid today ,he may not need midodrine at d/c    Kelly Splinter MD  pager [phone]    cell [phone]  11/09/2013, 1:48 PM     Recent Labs Lab 11/05/13 0435 11/06/13 0350 11/07/13 0415  NA 139 139 138  K 3.6* 3.7 3.8  CL 99 97 97  CO2 26 27 27   GLUCOSE 91 87 88  BUN 16 12 10   CREATININE 3.45* 3.14* 3.30*  CALCIUM 7.7* 7.8* 8.2*    Recent Labs Lab 11/04/13 0345 11/05/13 0435 11/06/13 0350  AST 48* 37 33  ALT 27 21 17   ALKPHOS 77  73 76  BILITOT 0.8 0.8 0.6  PROT 5.0* 5.2* 5.1*  ALBUMIN 1.6* 1.8* 1.6*    Recent Labs Lab 11/05/13 0435 11/06/13 0350 11/07/13 0415  WBC 8.6 8.8 7.4  HGB 8.4* 8.1* 8.5*  HCT 25.2* 23.9* 25.1*  MCV 84.3 84.2 85.7  PLT 202 216 245   . ALPRAZolam  0.5 mg Oral Once  . amiodarone  200 mg Oral Daily  . aspirin EC  81 mg Oral Daily  . chlorhexidine  15 mL Mouth Rinse BID  . clonazePAM  0.5 mg Oral BID  . darbepoetin (ARANESP) injection - DIALYSIS  100 mcg Intravenous Q Tue-HD  . docusate sodium  200 mg Oral Daily  . feeding supplement (NEPRO CARB STEADY)  237 mL Oral BID BM  . ferrous [id number] C-folic acid  1 capsule Oral q morning - 10a  . gentamicin  100 mg Intravenous Q M,W,F-1800  . levothyroxine  200 mcg Oral QAC breakfast  . midodrine  10 mg Oral BID WC  . mirtazapine  15 mg Oral  QHS  . nafcillin IV  2 g Intravenous 6 times per day  . pantoprazole  40 mg Oral QAC breakfast  . rifampin  300 mg Oral 3 times per day  . sertraline  50 mg Oral Daily  . sodium chloride  3 mL Intravenous Q12H   . sodium chloride 20 mL/hr at 11/04/13 2146   sodium chloride, albumin human, ALPRAZolam, bisacodyl, bisacodyl, ondansetron (ZOFRAN) IV, ondansetron, sodium chloride

## 2013-11-09 NOTE — Progress Notes (Addendum)
      [address]       [place] [address]             [phone]      12 Days Post-Op Procedure(s) (LRB): REDO STERNOTOMY (N/A) REDO BENTALL PROCEDURE, debridment of aoritc root abscess, replacement of aortic root, ascending aorta and aortic valve with homograft. Insertion of left femoral arterial line (N/A) INTRAOPERATIVE TRANSESOPHAGEAL ECHOCARDIOGRAM (N/A)  Subjective:  Bradley [name] is without complaints this morning.  He continues to feel better. He is ambulating with assistance.  + BM  Objective: Vital signs in last 24 hours: Temp:  [97.6 F (36.4 C)-98.3 F (36.8 C)] 98.3 F (36.8 C) (04/12 0422) Pulse Rate:  [87-99] 88 (04/12 0422) Cardiac Rhythm:  [-] Normal sinus rhythm (04/11 2000) Resp:  [17-18] 18 (04/12 0422) BP: (105-132)/(69-88) 107/69 mmHg (04/12 0422) SpO2:  [94 %-100 %] 96 % (04/12 0422) Weight:  [194 lb 0.1 oz (88 kg)-200 lb 6.4 oz (90.9 kg)] 194 lb 7.1 oz (88.2 kg) (04/12 0437)  Intake/Output from previous day: 04/11 0701 - 04/12 0700 In: -  Out: 2950   General appearance: alert, cooperative and no distress Heart: regular rate and rhythm Lungs: clear to auscultation bilaterally Abdomen: soft, non-tender; bowel sounds normal; no masses,  no organomegaly Extremities: edema trace Wound: clean and dry  Lab Results:  Recent Labs  11/07/13 0415  WBC 7.4  HGB 8.5*  HCT 25.1*  PLT 245   BMET:  Recent Labs  11/07/13 0415  NA 138  K 3.8  CL 97  CO2 27  GLUCOSE 88  BUN 10  CREATININE 3.30*  CALCIUM 8.2*    PT/INR: No results found for this basename: LABPROT, INR,  in the last 72 hours ABG    Component Value Date/Time   PHART 7.515* 10/31/2013 1342   HCO3 25.6* 10/31/2013 1342   TCO2 27 10/31/2013 1342   ACIDBASEDEF 1.0 10/29/2013 1536   O2SAT 99.0 10/31/2013 1342   CBG (last 3)   Recent Labs  11/07/13 1130 11/07/13 2246 11/08/13 0621  GLUCAP 98 98 101*    Assessment/Plan: S/P Procedure(s) (LRB): REDO STERNOTOMY  (N/A) REDO BENTALL PROCEDURE, debridment of aoritc root abscess, replacement of aortic root, ascending aorta and aortic valve with homograft. Insertion of left femoral arterial line (N/A) INTRAOPERATIVE TRANSESOPHAGEAL ECHOCARDIOGRAM (N/A)  1. CV- NSR good rate and pressure control- on Amiodarone 2. Pulm- no acute issues, continue IS 3. Renal-ESRD, HD yesterday, Nephrology following 4. Endocarditis- continue ABX, ID following 5. Dispo- patient doing better, progressing nicely will plan for CIR tomorrow, if bed available   LOS: 18 days    Ellwood Handler 11/09/2013  For rehab tomorrow Sleep better last pm Wires out today, central line out  I have seen and examined Bradley [name] and agree with the above assessment  and plan.  [name] MD Beeper [phone] Office [phone] 11/09/2013 9:07 AM

## 2013-11-09 NOTE — Clinical Social Work Psychosocial (Signed)
Clinical Social Work Department BRIEF PSYCHOSOCIAL ASSESSMENT 11/09/2013  Patient:  URIJAH, ARKO     Account Number:  [id number]     Admit date:  10/22/2013  Clinical Social Worker:  Hubert Azure  Date/Time:  11/09/2013 04:50 PM  Referred by:  Physician  Date Referred:  11/09/2013 Referred for  SNF Placement   Other Referral:   Interview type:  Patient Other interview type:    PSYCHOSOCIAL DATA Living Status:  WIFE Admitted from facility:   Level of care:   Primary support name:  [name] Primary support relationship to patient:  SPOUSE Degree of support available:   Good    CURRENT CONCERNS Current Concerns  Post-Acute Placement   Other Concerns:    SOCIAL WORK ASSESSMENT / PLAN CSW met with patient who was alert and oriented x4. CSW introduced self and explained role. CSW discussed d/c plan with patient. Patient reported he is agreeable with SNF if he has to go. CSW asked patient how he felt about going to SNF. Patient stated if CIR is not an option, then he will go to SNF. Patient stated he will do what he has to do to get better. CSW verbalized understanding of the above.   Assessment/plan status:  Information/Referral to [employer] Other assessment/ plan:   Information/referral to community resources:   Patient provided with community SNF list for [employer].    PATIENT'S/FAMILY'S RESPONSE TO PLAN OF CARE: Patient thanked CSW for assisting with d/c plan.    [place], [place] Weekend Clinical Social Worker [phone]

## 2013-11-09 NOTE — Discharge Instructions (Signed)
Aortic Valve Replacement °Care After °Refer to this sheet in the next few weeks. These instructions provide you with information on caring for yourself after your procedure. Your caregiver may also give you specific instructions. Your treatment has been planned according to current medical practices, but problems sometimes occur. Call your caregiver if you have any problems or questions after your procedure. °HOME CARE INSTRUCTIONS  °· Only take over-the-counter or prescription medicines as directed by your caregiver. °· Take your temperature every morning for the first 7 days after surgery. Write these down. Call your caregiver if your temperature stays above 100° F (37.8° C) for more than a day.   °· Weigh yourself every morning for at least 7 days after surgery. Write your weight down to monitor any weight increase. °· Wear elastic stockings during the day for at least 2 weeks after surgery. Use them longer if your ankles are swollen. The stockings help blood flow and help reduce swelling in the legs.  °· Take frequent naps or rest often throughout the day. °· Avoid lifting over 10 lbs (4.5 kg) or pushing or pulling things with your arms for 6 8 weeks or as directed by your caregiver. °· Avoid driving or airplane travel for 4 6 weeks after surgery or as directed. If you are riding in a car for an extended period, stop every 1 2 hours to stretch your legs. Keep a record of your medicines and medical history with you when traveling. °· Do not cross your legs. °· Do not take baths for 4 6 weeks after surgery. Take showers once your caregiver approves. Pat incisions dry. Do not rub incisions with a washcloth or towel. °· Avoid climbing stairs and using the handrail to pull yourself up for the first 2 3 weeks after surgery. °· Return to work as directed by your caregiver. °· Drink enough fluids to keep your urine clear or pale yellow. °· Do not strain to have a bowel movement. Eat high-fiber foods if you become  constipated. You may also take a medicine to help you have a bowel movement (laxative) as directed by your caregiver. °· Resume sexual activity as directed by your caregiver. Men should not use medicines for erectile dysfunction until their doctor says it is okay. °· If you had a certain type of heart condition in the past, you may need to take antibiotic medicine before having dental work or surgery. Let your dentist and caregivers know if you had one or more of the following: °· Previous endocarditis. °· An artificial (prosthetic) heart valve. °· Congenital heart disease. °SEEK MEDICAL CARE IF: °· You develop a skin rash.   °· Your weight is increasing each day over 2 3 days, or you have a sudden weight gain. °· Your weight increases by 2 or more pounds (1 kg or more) in a single day. °SEEK IMMEDIATE MEDICAL CARE IF:  °· You develop chest pain that is not coming from your incision.   °· You develop shortness of breath or have difficulty breathing.   °· You have a fever.   °· You have increased bleeding from your wounds.   °· You have increasing wound pain.   °· You have redness or swelling around your wounds °· You have pus coming from your wound.   °· You develop lightheadedness.   °MAKE SURE YOU:  °· Understand these directions. °· Will watch your condition. °· Will get help right away if you are not doing well or get worse. °Document Released: 02/02/2005 Document Revised: 07/03/2012 Document Reviewed: 04/30/2012 °[employer]® Patient   Information ©2014 [employer]. ° °

## 2013-11-10 DIAGNOSIS — Y831 Surgical operation with implant of artificial internal device as the cause of abnormal reaction of the patient, or of later complication, without mention of misadventure at the time of the procedure: Secondary | ICD-10-CM

## 2013-11-10 LAB — RENAL FUNCTION PANEL
Albumin: 1.6 g/dL — ABNORMAL LOW (ref 3.5–5.2)
BUN: 15 mg/dL (ref 6–23)
CO2: 26 mEq/L (ref 19–32)
Calcium: 8.3 mg/dL — ABNORMAL LOW (ref 8.4–10.5)
Chloride: 97 mEq/L (ref 96–112)
Creatinine, Ser: 4.65 mg/dL — ABNORMAL HIGH (ref 0.50–1.35)
GFR calc Af Amer: 15 mL/min — ABNORMAL LOW (ref >90)
GFR calc non Af Amer: 13 mL/min — ABNORMAL LOW (ref >90)
Glucose, Bld: 114 mg/dL — ABNORMAL HIGH (ref 70–99)
Phosphorus: 4.1 mg/dL (ref 2.3–4.6)
Potassium: 4.3 mEq/L (ref 3.7–5.3)
Sodium: 139 mEq/L (ref 137–147)

## 2013-11-10 MED ORDER — LIDOCAINE HCL (PF) 1 % IJ SOLN: 5.0000 mL | INTRAMUSCULAR | Status: DC | PRN

## 2013-11-10 MED ORDER — CEFAZOLIN SODIUM-DEXTROSE 2-3 GM-% IV SOLR
2.0000 g | INTRAVENOUS | Status: DC
  Filled 2013-11-10: qty 50

## 2013-11-10 MED ORDER — CEFAZOLIN SODIUM-DEXTROSE 2-3 GM-% IV SOLR
2.0000 g | Freq: Once | INTRAVENOUS | Status: AC
  Administered 2013-11-10: 2 g via INTRAVENOUS
  Filled 2013-11-10 (×2): qty 50

## 2013-11-10 MED ORDER — SODIUM CHLORIDE 0.9 % IV SOLN: 100.0000 mL | INTRAVENOUS | Status: DC | PRN

## 2013-11-10 MED ORDER — HEPARIN SODIUM (PORCINE) 1000 UNIT/ML DIALYSIS
3000.0000 [IU] | INTRAMUSCULAR | Status: DC | PRN
  Administered 2013-11-10: 3000 [IU] via INTRAVENOUS_CENTRAL

## 2013-11-10 MED ORDER — NEPRO/CARBSTEADY PO LIQD
237.0000 mL | ORAL | Status: DC | PRN
  Filled 2013-11-10: qty 237

## 2013-11-10 MED ORDER — PENTAFLUOROPROP-TETRAFLUOROETH EX AERO: 1.0000 "application " | INHALATION_SPRAY | CUTANEOUS | Status: DC | PRN

## 2013-11-10 MED ORDER — ALTEPLASE 2 MG IJ SOLR
2.0000 mg | Freq: Once | INTRAMUSCULAR | Status: DC | PRN
  Filled 2013-11-10: qty 2

## 2013-11-10 MED ORDER — RIFAMPIN 300 MG PO CAPS
300.0000 mg | ORAL_CAPSULE | Freq: Three times a day (TID) | ORAL | Status: DC
  Administered 2013-11-10 – 2013-11-13 (×8): 300 mg via ORAL
  Filled 2013-11-10 (×10): qty 1

## 2013-11-10 MED ORDER — LIDOCAINE-PRILOCAINE 2.5-2.5 % EX CREA: 1.0000 "application " | TOPICAL_CREAM | CUTANEOUS | Status: DC | PRN

## 2013-11-10 MED ORDER — HEPARIN SODIUM (PORCINE) 1000 UNIT/ML DIALYSIS: 1000.0000 [IU] | INTRAMUSCULAR | Status: DC | PRN

## 2013-11-10 NOTE — Progress Notes (Signed)
[name] still has no peripheral IV access, post 2 IV Team attempts and 1 RN attempt.  Dr. Lorrene Reid notified and received a one-time order to access HD cath for antibiotics this evening.  Bradley [name]

## 2013-11-10 NOTE — Progress Notes (Signed)
PT Note:   Pt in HD, will check back as time permits today or otherwise, tomorrow. Ambulated with cardiac rehab this AM. Leighton Roach, [place]  [phone]

## 2013-11-10 NOTE — Progress Notes (Addendum)
      [address]       [place] [address]             [phone]      13 Days Post-Op Procedure(s) (LRB): REDO STERNOTOMY (N/A) REDO BENTALL PROCEDURE, debridment of aoritc root abscess, replacement of aortic root, ascending aorta and aortic valve with homograft. Insertion of left femoral arterial line (N/A) INTRAOPERATIVE TRANSESOPHAGEAL ECHOCARDIOGRAM (N/A)  Subjective:  Bradley [name] has no complaints this morning.  He continues to feel better.  He is hoping he can be discharged to inpatient rehab soon.  Objective: Vital signs in last 24 hours: Temp:  [98.1 F (36.7 C)-98.2 F (36.8 C)] 98.2 F (36.8 C) (04/13 0429) Pulse Rate:  [93-107] 93 (04/13 0429) Cardiac Rhythm:  [-] Normal sinus rhythm (04/12 2015) Resp:  [17-18] 17 (04/13 0429) BP: (97-118)/(56-81) 118/81 mmHg (04/13 0429) SpO2:  [97 %-98 %] 97 % (04/13 0429) Weight:  [195 lb 1.7 oz (88.5 kg)] 195 lb 1.7 oz (88.5 kg) (04/13 0429)  Intake/Output from previous day: 04/12 0701 - 04/13 0700 In: 120 [P.O.:120] Out: -   General appearance: alert, cooperative and no distress Heart: regular rate and rhythm Lungs: clear to auscultation bilaterally Abdomen: soft, non-tender; bowel sounds normal; no masses,  no organomegaly Extremities: edema LUE edema improving, trace Wound: clean and dry  Lab Results: No results found for this basename: WBC, HGB, HCT, PLT,  in the last 72 hours BMET: No results found for this basename: NA, K, CL, CO2, GLUCOSE, BUN, CREATININE, CALCIUM,  in the last 72 hours  PT/INR: No results found for this basename: LABPROT, INR,  in the last 72 hours ABG    Component Value Date/Time   PHART 7.515* 10/31/2013 1342   HCO3 25.6* 10/31/2013 1342   TCO2 27 10/31/2013 1342   ACIDBASEDEF 1.0 10/29/2013 1536   O2SAT 99.0 10/31/2013 1342   CBG (last 3)   Recent Labs  11/07/13 1130 11/07/13 2246 11/08/13 0621  GLUCAP 98 98 101*    Assessment/Plan: S/P Procedure(s) (LRB): REDO  STERNOTOMY (N/A) REDO BENTALL PROCEDURE, debridment of aoritc root abscess, replacement of aortic root, ascending aorta and aortic valve with homograft. Insertion of left femoral arterial line (N/A) INTRAOPERATIVE TRANSESOPHAGEAL ECHOCARDIOGRAM (N/A)  1. CV- rate tachy this morning, good pressure control- continue Amiodarone 2. Pulm- no acute issues, encourage use of IS 3. Renal- ESRD, Nephrology following, HD last done on Saturday 4. Endocarditis- on ABX, will require 8 weeks of coverage, 2 weeks of Gentamicin 5. Deconditioning- PT recs CIR, they will evaluate patient today and see if candidate for admission 6. Dispo- patient is stable, ready for d/c to CIR if willing to accept patient, otherwise need to arrange SNF placement   LOS: 19 days    Ellwood Handler 11/10/2013  Was ready to CIR now refused to take, so now we start over with d/c plan I have seen and examined Bradley [name] and agree with the above assessment  and plan.  [name] MD Beeper [phone] Office [phone] 11/10/2013 4:11 PM

## 2013-11-10 NOTE — Progress Notes (Signed)
Rehab admissions - I spoke with Dr. Naaman Plummer and with rehab team this am.  Patient is doing too well for acute inpatient rehab admission.  His options will be home with [facility] or SNF if patient and medical team prefer.  I have called and informed the Education officer, museum.  Call me for questions.  RC:[phone]

## 2013-11-10 NOTE — Progress Notes (Signed)
CARDIAC REHAB PHASE I   PRE:  Rate/Rhythm: 89 SR    BP: sitting 113/88    SaO2: 95 rA  MODE:  Ambulation: 350 ft   POST:  Rate/Rhythm: 110 ST with PVC    BP: sitting 125/88     SaO2: 99 RA  Pt used RW, assist x1. Unsteady on feet, weak legs. Rest x1, exhausted after walk. To recliner. Ed completed with pt and is interested in CRPII in about 6 weeks after he has recovered more. Will send referral to [place]. D/c video not working. [place], ACSM 11/10/2013 9:38 AM

## 2013-11-10 NOTE — Progress Notes (Addendum)
Update: No bed offers at this time. CSW reviewed chart and noticed patient walking 350 ft. CSW will consult with MD and PA to see if patient may want to consider Home Health.  CSW spoke to patient about possible SNF placement at discharge. CSW explained that with Medicaid, patient would need to stay at a SNF for 30 days and social worker would have to search in different counties for a Medicaid bed. Patient verbalized his understanding and states that transportation may be an issue and he may end up going home with [facility]. Patient states he will just take one day at a time and see how he is closer to discharge. CSW paged MD for a 30 day note and FL2 signature in order for patient to get a PASRR number for a SNF. [Patient asked that social worker follow up with wife. CSW called wife and left a message to return phone call .  Jeanette Caprice, MSW, [place]

## 2013-11-10 NOTE — Progress Notes (Signed)
ANTIBIOTIC CONSULT NOTE - FOLLOW UP  Pharmacy Consult for Cefazolin Indication: MSSA bacteremia/endocarditis  Allergies  Allergen Reactions  . Oxycodone     Gives patient nightmares  . Rifampin Nausea Only   Patient Measurements: Height: 6' 1.5" (186.7 cm) Weight: 191 lb 2.2 oz (86.7 kg) IBW/kg (Calculated) : 81.05  Vital Signs: Temp: 97.7 F (36.5 C) (04/13 1652) Temp src: Oral (04/13 1652) BP: 122/85 mmHg (04/13 1652) Pulse Rate: 98 (04/13 1652) Intake/Output from previous day: 04/12 0701 - 04/13 0700 In: 120 [P.O.:120] Out: -  Intake/Output from this shift: Total I/O In: -  Out: 2700 [Other:2700]  Labs:  Recent Labs  11/10/13 1331  CREATININE 4.65*   Estimated Creatinine Clearance: 20.3 ml/min (by C-G formula based on Cr of 4.65). No results found for this basename: VANCOTROUGH, Corlis Leak, VANCORANDOM, GENTTROUGH, GENTPEAK, GENTRANDOM, TOBRATROUGH, TOBRAPEAK, TOBRARND, AMIKACINPEAK, AMIKACINTROU, AMIKACIN,  in the last 72 hours  Microbiology: Recent Results (from the past 720 hour(s))  CLOSTRIDIUM DIFFICILE BY PCR     Status: None   Collection Time    10/22/13 10:04 PM      Result Value Ref Range Status   C difficile by pcr NEGATIVE  NEGATIVE Final  CULTURE, BLOOD (ROUTINE X 2)     Status: None   Collection Time    10/22/13 10:26 PM      Result Value Ref Range Status   Specimen Description BLOOD RIGHT ARM   Final   Special Requests BOTTLES DRAWN AEROBIC ONLY 3CC   Final   Culture  Setup Time     Final   Value: 10/23/2013 04:42     Performed at [facility]   Culture     Final   Value: NO GROWTH 5 DAYS     Performed at [facility]   Report Status 10/29/2013 FINAL   Final  CULTURE, BLOOD (ROUTINE X 2)     Status: None   Collection Time    10/22/13 10:34 PM      Result Value Ref Range Status   Specimen Description BLOOD RIGHT HAND   Final   Special Requests BOTTLES DRAWN AEROBIC ONLY [facility]   Final   Culture  Setup Time     Final   Value: 10/23/2013 04:42     Performed at [facility]   Culture     Final   Value: NO GROWTH 5 DAYS     Performed at [facility]   Report Status 10/29/2013 FINAL   Final  SURGICAL PCR SCREEN     Status: None   Collection Time    10/27/13  1:13 PM      Result Value Ref Range Status   MRSA, PCR NEGATIVE  NEGATIVE Final   Staphylococcus aureus NEGATIVE  NEGATIVE Final   Comment:            The Xpert SA Assay (FDA     approved for NASAL specimens     in patients over 62 years of age),     is one component of     a comprehensive surveillance     program.  Test performance has     been validated by [employer] for patients greater     than or equal to 24 year old.     It is not intended     to diagnose infection nor to     guide or monitor treatment.  TISSUE CULTURE     Status: None  Collection Time    10/28/13  1:20 PM      Result Value Ref Range Status   Specimen Description TISSUE AORTA   Final   Special Requests PT ON VANCOMYCIN AND ZINACEF   Final   Gram Stain     Final   Value: RARE WBC PRESENT,BOTH PMN AND MONONUCLEAR     NO ORGANISMS SEEN     Performed at [facility]   Culture     Final   Value: NO GROWTH 3 DAYS     Performed at [facility]   Report Status 10/31/2013 FINAL   Final  GRAM STAIN     Status: None   Collection Time    10/28/13  2:26 PM      Result Value Ref Range Status   Specimen Description ABSCESS AORTA   Final   Special Requests PT ON VANCOMYCIN AND ZINACEF   Final   Gram Stain     Final   Value: RARE WBC PRESENT, PREDOMINANTLY PMN     NO ORGANISMS SEEN   Report Status 10/28/2013 FINAL   Final  ANAEROBIC CULTURE     Status: None   Collection Time    10/28/13  2:26 PM      Result Value Ref Range Status   Specimen Description ABSCESS AORTA   Final   Special Requests PT ON VANCOMYCIN AND ZINACEF   Final   Gram Stain     Final   Value: RARE WBC PRESENT,BOTH PMN AND MONONUCLEAR     NO ORGANISMS SEEN      Performed at [facility]   Culture     Final   Value: NO ANAEROBES ISOLATED     Performed at [facility]   Report Status 11/02/2013 FINAL   Final  CULTURE, ROUTINE-ABSCESS     Status: None   Collection Time    10/28/13  2:26 PM      Result Value Ref Range Status   Specimen Description ABSCESS AORTA   Final   Special Requests PT ON VANCOMYCIN AND ZINACEF   Final   Gram Stain     Final   Value: RARE WBC PRESENT, PREDOMINANTLY PMN     NO SQUAMOUS EPITHELIAL CELLS SEEN     NO ORGANISMS SEEN     Performed at [facility]     Performed at [facility]   Culture     Final   Value: NO GROWTH 3 DAYS     Performed at [facility]   Report Status 10/31/2013 FINAL   Final  CLOSTRIDIUM DIFFICILE BY PCR     Status: None   Collection Time    11/02/13 10:04 AM      Result Value Ref Range Status   C difficile by pcr NEGATIVE  NEGATIVE Final  URINE CULTURE     Status: None   Collection Time    11/04/13  9:45 AM      Result Value Ref Range Status   Specimen Description URINE, CLEAN CATCH   Final   Special Requests Nafcillin, Rifampin, Gent Normal   Final   Culture  Setup Time     Final   Value: 11/04/2013 10:58     Performed at [place]     Final   Value: NO GROWTH     Performed at [facility]   Culture     Final   Value: NO GROWTH  Performed at [facility]   Report Status 11/05/2013 FINAL   Final    Anti-infectives   Start     Dose/Rate Route Frequency Ordered Stop   11/10/13 2200  rifampin (RIFADIN) capsule 300 mg     300 mg Oral 3 times daily 11/10/13 1813     11/08/13 1530  gentamicin (GARAMYCIN) IVPB 100 mg     100 mg 200 mL/hr over 30 Minutes Intravenous  Once 11/08/13 1527 11/08/13 1804   11/06/13 1400  nafcillin 2 g in dextrose 5 % 50 mL IVPB  Status:  Discontinued     2 g 100 mL/hr over 30 Minutes Intravenous 6 times per day 11/06/13 1347 11/10/13 1812   11/05/13 1800  gentamicin  (GARAMYCIN) IVPB 100 mg     100 mg 200 mL/hr over 30 Minutes Intravenous Every M-W-F (1800) 11/04/13 1317 11/12/13 2359   11/04/13 1300  gentamicin (GARAMYCIN) IVPB 100 mg     100 mg 200 mL/hr over 30 Minutes Intravenous  Once 11/04/13 1146 11/04/13 1320   11/03/13 1200  gentamicin (GARAMYCIN) IVPB 100 mg  Status:  Discontinued     100 mg 200 mL/hr over 30 Minutes Intravenous Every M-W-F (Hemodialysis) 11/01/13 1429 11/04/13 1317   11/02/13 2200  rifampin (RIFADIN) capsule 300 mg  Status:  Discontinued     300 mg Oral 3 times per day 11/02/13 1426 11/10/13 1812   11/01/13 1200  gentamicin (GARAMYCIN) IVPB 100 mg  Status:  Discontinued     100 mg 200 mL/hr over 30 Minutes Intravenous Every T-Th-Sa (Hemodialysis) 10/30/13 1037 11/01/13 1429   10/31/13 1200  gentamicin (GARAMYCIN) IVPB 80 mg  Status:  Discontinued     80 mg 100 mL/hr over 30 Minutes Intravenous Every Dialysis 10/30/13 0047 10/30/13 1027   10/30/13 1300  gentamicin (GARAMYCIN) 120 mg in dextrose 5 % 50 mL IVPB     120 mg 106 mL/hr over 30 Minutes Intravenous  Once 10/30/13 1037 10/30/13 1615   10/30/13 0500  gentamicin (GARAMYCIN) IVPB 80 mg     80 mg 100 mL/hr over 30 Minutes Intravenous  Once 10/30/13 0047 10/30/13 0526   10/29/13 1600  levofloxacin (LEVAQUIN) IVPB 500 mg  Status:  Discontinued     500 mg 100 mL/hr over 60 Minutes Intravenous Every 48 hours 10/27/13 1457 10/29/13 0857   10/29/13 1000  rifampin (RIFADIN) 300 mg in sodium chloride 0.9 % 100 mL IVPB     300 mg 200 mL/hr over 30 Minutes Intravenous 3 times per day 10/29/13 0858 11/02/13 1520   10/29/13 1000  nafcillin 2 g in dextrose 5 % 50 mL IVPB  Status:  Discontinued     2 g 100 mL/hr over 30 Minutes Intravenous 6 times per day 10/29/13 0901 11/06/13 1347   10/29/13 0900  nafcillin injection 2 g  Status:  Discontinued     2 g Intravenous Every 4 hours 10/29/13 0857 10/29/13 0900   10/29/13 0500  vancomycin (VANCOCIN) IVPB 1000 mg/200 mL premix      1,000 mg 200 mL/hr over 60 Minutes Intravenous  Once 10/28/13 2146 10/29/13 0600   10/29/13 0100  cefUROXime (ZINACEF) 1.5 g in dextrose 5 % 50 mL IVPB  Status:  Discontinued     1.5 g 100 mL/hr over 30 Minutes Intravenous Every 12 hours 10/28/13 2146 10/29/13 0857   10/28/13 0400  vancomycin (VANCOCIN) 1,250 mg in sodium chloride 0.9 % 250 mL IVPB     1,250 mg 166.7 mL/hr over  90 Minutes Intravenous To Surgery 10/27/13 1450 10/28/13 0905   10/28/13 0400  cefUROXime (ZINACEF) 1.5 g in dextrose 5 % 50 mL IVPB     1.5 g 100 mL/hr over 30 Minutes Intravenous To Surgery 10/27/13 1450 10/28/13 2030   10/28/13 0400  cefUROXime (ZINACEF) 750 mg in dextrose 5 % 50 mL IVPB  Status:  Discontinued     750 mg 100 mL/hr over 30 Minutes Intravenous To Surgery 10/27/13 1447 10/28/13 2114   10/27/13 1600  levofloxacin (LEVAQUIN) IVPB 750 mg     750 mg 100 mL/hr over 90 Minutes Intravenous  Once 10/27/13 1456 10/28/13 0231   10/23/13 0800  ceFAZolin (ANCEF) IVPB 1 g/50 mL premix  Status:  Discontinued     1 g 100 mL/hr over 30 Minutes Intravenous  Once 10/22/13 2313 10/22/13 2326   10/22/13 2359  ceFAZolin (ANCEF) IVPB 2 g/50 mL premix  Status:  Discontinued     2 g 100 mL/hr over 30 Minutes Intravenous Every M-W-F (Hemodialysis) 10/22/13 2314 10/28/13 2146   10/22/13 2315  ceFAZolin (ANCEF) IVPB 2 g/50 mL premix  Status:  Discontinued     2 g 100 mL/hr over 30 Minutes Intravenous  Once 10/22/13 2313 10/22/13 2326     Assessment: 58 y.o. M with ESRD who is switching from Nafcillin to Cefazolin for continued treatment of MSSA prosthetic valve IE. The patient is noted to have received HD off-schedule earlier today with plans to get back on regular schedule of T/Th/Sat starting on 4/14. Will give a dose of Ancef this evening, followed by post-HD dosing. The patient continues on Gentamicin for synergy until 4/15 (completion of 2 weeks).  Goal of Therapy:  Proper antibiotics for infection/cultures adjusted  for renal/hepatic function   Plan:  1. Cefazolin 2g IV x 1 dose now 2. Cefazolin 2g post-HD sessions on T/Th/Sat 3. Will continue to follow HD schedule/duration, culture results, LOT, and antibiotic de-escalation plans   Alycia Rossetti, PharmD, BCPS Clinical Pharmacist Pager: [phone] 11/10/2013 6:54 PM

## 2013-11-10 NOTE — Progress Notes (Signed)
Patient off floor for a procedure (HD) and unavailable for OT at this time.

## 2013-11-10 NOTE — Progress Notes (Signed)
[facility]    Day # 13 nafcillin Day # 13 rifampin Day # 11 gentamicin   Subjective: Tired from HD  Antibiotics:  Anti-infectives   Start     Dose/Rate Route Frequency Ordered Stop   11/10/13 2200  rifampin (RIFADIN) capsule 300 mg     300 mg Oral 3 times daily 11/10/13 1813     11/08/13 1530  gentamicin (GARAMYCIN) IVPB 100 mg     100 mg 200 mL/hr over 30 Minutes Intravenous  Once 11/08/13 1527 11/08/13 1804   11/06/13 1400  nafcillin 2 g in dextrose 5 % 50 mL IVPB  Status:  Discontinued     2 g 100 mL/hr over 30 Minutes Intravenous 6 times per day 11/06/13 1347 11/10/13 1812   11/05/13 1800  gentamicin (GARAMYCIN) IVPB 100 mg     100 mg 200 mL/hr over 30 Minutes Intravenous Every M-W-F (1800) 11/04/13 1317 11/12/13 2359   11/04/13 1300  gentamicin (GARAMYCIN) IVPB 100 mg     100 mg 200 mL/hr over 30 Minutes Intravenous  Once 11/04/13 1146 11/04/13 1320   11/03/13 1200  gentamicin (GARAMYCIN) IVPB 100 mg  Status:  Discontinued     100 mg 200 mL/hr over 30 Minutes Intravenous Every M-W-F (Hemodialysis) 11/01/13 1429 11/04/13 1317   11/02/13 2200  rifampin (RIFADIN) capsule 300 mg  Status:  Discontinued     300 mg Oral 3 times per day 11/02/13 1426 11/10/13 1812   11/01/13 1200  gentamicin (GARAMYCIN) IVPB 100 mg  Status:  Discontinued     100 mg 200 mL/hr over 30 Minutes Intravenous Every T-Th-Sa (Hemodialysis) 10/30/13 1037 11/01/13 1429   10/31/13 1200  gentamicin (GARAMYCIN) IVPB 80 mg  Status:  Discontinued     80 mg 100 mL/hr over 30 Minutes Intravenous Every Dialysis 10/30/13 0047 10/30/13 1027   10/30/13 1300  gentamicin (GARAMYCIN) 120 mg in dextrose 5 % 50 mL IVPB     120 mg 106 mL/hr over 30 Minutes Intravenous  Once 10/30/13 1037 10/30/13 1615   10/30/13 0500  gentamicin (GARAMYCIN) IVPB 80 mg     80 mg 100 mL/hr over 30 Minutes Intravenous  Once 10/30/13 0047 10/30/13 0526   10/29/13 1600  levofloxacin (LEVAQUIN) IVPB 500 mg   Status:  Discontinued     500 mg 100 mL/hr over 60 Minutes Intravenous Every 48 hours 10/27/13 1457 10/29/13 0857   10/29/13 1000  rifampin (RIFADIN) 300 mg in sodium chloride 0.9 % 100 mL IVPB     300 mg 200 mL/hr over 30 Minutes Intravenous 3 times per day 10/29/13 0858 11/02/13 1520   10/29/13 1000  nafcillin 2 g in dextrose 5 % 50 mL IVPB  Status:  Discontinued     2 g 100 mL/hr over 30 Minutes Intravenous 6 times per day 10/29/13 0901 11/06/13 1347   10/29/13 0900  nafcillin injection 2 g  Status:  Discontinued     2 g Intravenous Every 4 hours 10/29/13 0857 10/29/13 0900   10/29/13 0500  vancomycin (VANCOCIN) IVPB 1000 mg/200 mL premix     1,000 mg 200 mL/hr over 60 Minutes Intravenous  Once 10/28/13 2146 10/29/13 0600   10/29/13 0100  cefUROXime (ZINACEF) 1.5 g in dextrose 5 % 50 mL IVPB  Status:  Discontinued     1.5 g 100 mL/hr over 30 Minutes Intravenous Every 12 hours 10/28/13 2146 10/29/13 0857   10/28/13 0400  vancomycin (VANCOCIN) 1,250 mg in sodium chloride 0.9 %  250 mL IVPB     1,250 mg 166.7 mL/hr over 90 Minutes Intravenous To Surgery 10/27/13 1450 10/28/13 0905   10/28/13 0400  cefUROXime (ZINACEF) 1.5 g in dextrose 5 % 50 mL IVPB     1.5 g 100 mL/hr over 30 Minutes Intravenous To Surgery 10/27/13 1450 10/28/13 2030   10/28/13 0400  cefUROXime (ZINACEF) 750 mg in dextrose 5 % 50 mL IVPB  Status:  Discontinued     750 mg 100 mL/hr over 30 Minutes Intravenous To Surgery 10/27/13 1447 10/28/13 2114   10/27/13 1600  levofloxacin (LEVAQUIN) IVPB 750 mg     750 mg 100 mL/hr over 90 Minutes Intravenous  Once 10/27/13 1456 10/28/13 0231   10/23/13 0800  ceFAZolin (ANCEF) IVPB 1 g/50 mL premix  Status:  Discontinued     1 g 100 mL/hr over 30 Minutes Intravenous  Once 10/22/13 2313 10/22/13 2326   10/22/13 2359  ceFAZolin (ANCEF) IVPB 2 g/50 mL premix  Status:  Discontinued     2 g 100 mL/hr over 30 Minutes Intravenous Every M-W-F (Hemodialysis) 10/22/13 2314 10/28/13 2146     10/22/13 2315  ceFAZolin (ANCEF) IVPB 2 g/50 mL premix  Status:  Discontinued     2 g 100 mL/hr over 30 Minutes Intravenous  Once 10/22/13 2313 10/22/13 2326      Medications: Scheduled Meds: . ALPRAZolam  0.5 mg Oral Once  . amiodarone  200 mg Oral Daily  . aspirin EC  81 mg Oral Daily  . chlorhexidine  15 mL Mouth Rinse BID  . clonazePAM  0.5 mg Oral BID  . darbepoetin (ARANESP) injection - DIALYSIS  100 mcg Intravenous Q Tue-HD  . docusate sodium  200 mg Oral Daily  . feeding supplement (NEPRO CARB STEADY)  237 mL Oral BID BM  . ferrous Q000111Q C-folic acid  1 capsule Oral q morning - 10a  . gentamicin  100 mg Intravenous Q M,W,F-1800  . levothyroxine  200 mcg Oral QAC breakfast  . midodrine  5 mg Oral BID WC  . mirtazapine  15 mg Oral QHS  . pantoprazole  40 mg Oral QAC breakfast  . rifampin  300 mg Oral TID  . sertraline  50 mg Oral Daily  . sodium chloride  3 mL Intravenous Q12H   Continuous Infusions: . sodium chloride 20 mL/hr at 11/04/13 2146   PRN Meds:.sodium chloride, albumin human, ALPRAZolam, bisacodyl, bisacodyl, ondansetron (ZOFRAN) IV, ondansetron, sodium chloride   Objective: Weight change: -5 lb 4.7 oz (-2.4 kg)  Intake/Output Summary (Last 24 hours) at 11/10/13 1813 Last data filed at 11/10/13 1652  Gross per 24 hour  Intake      0 ml  Output   2700 ml  Net  -2700 ml   Blood pressure 122/85, pulse 98, temperature 97.7 F (36.5 C), temperature source Oral, resp. rate 20, height 6' 1.5" (1.867 m), weight 191 lb 2.2 oz (86.7 kg), SpO2 99.00%. Temp:  [97.6 F (36.4 C)-98.2 F (36.8 C)] 97.7 F (36.5 C) (04/13 1652) Pulse Rate:  [87-98] 98 (04/13 1652) Resp:  [13-23] 20 (04/13 1652) BP: (100-125)/(77-92) 122/85 mmHg (04/13 1652) SpO2:  [97 %-99 %] 99 % (04/13 1652) Weight:  [191 lb 2.2 oz (86.7 kg)-197 lb 1.5 oz (89.4 kg)] 191 lb 2.2 oz (86.7 kg) (04/13 1652)  Physical Exam: General:aox 3 ,NAD  VSS  Neuro nonfocal Lab  Results: No results found for this basename: WBC, HGB, HCT, PLT,  in the last 72 hours  BMET  Recent Labs  11/10/13 1331  NA 139  K 4.3  CL 97  CO2 26  GLUCOSE 114*  BUN 15  CREATININE 4.65*  CALCIUM 8.3*    Micro Results: Recent Results (from the past 240 hour(s))  CLOSTRIDIUM DIFFICILE BY PCR     Status: None   Collection Time    11/02/13 10:04 AM      Result Value Ref Range Status   C difficile by pcr NEGATIVE  NEGATIVE Final  URINE CULTURE     Status: None   Collection Time    11/04/13  9:45 AM      Result Value Ref Range Status   Specimen Description URINE, CLEAN CATCH   Final   Special Requests Nafcillin, Rifampin, Gent Normal   Final   Culture  Setup Time     Final   Value: 11/04/2013 10:58     Performed at [facility]     Final   Value: NO GROWTH     Performed at [facility]   Culture     Final   Value: NO GROWTH     Performed at [facility]   Report Status 11/05/2013 FINAL   Final    Studies/Results: No results found.    Assessment/Plan: Bradley [name] is a 58 y.o. 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  I personally reviewed CT brain with MRI brain with Rolla Flatten from [facility] and there is no active CNS disease at this point and therefore CNS penetration is not an issue anymore  --therefore will switch from Nafcillin to IV ancef which we should be able to dose 2G with HD   --continue this with Rifampin which I am changing to oral 300mg  tid  FOR TOTAL OF  X 8  Weeks post OP  And Gentamicin for 2 weeks.         LOS: 19 days   Truman Hayward 11/10/2013, 6:13 PM

## 2013-11-10 NOTE — Procedures (Signed)
Pt seen on HD.  Ap 110 Vp 140. BFR 400.  Tolerating HD well so far.

## 2013-11-11 ENCOUNTER — Encounter: Payer: Self-pay | Admitting: Vascular Surgery

## 2013-11-11 ENCOUNTER — Other Ambulatory Visit (HOSPITAL_COMMUNITY): Payer: Self-pay

## 2013-11-11 MED ORDER — CEFAZOLIN SODIUM-DEXTROSE 2-3 GM-% IV SOLR
2.0000 g | INTRAVENOUS | Status: DC
  Administered 2013-11-12: 2 g via INTRAVENOUS
  Filled 2013-11-11 (×2): qty 50

## 2013-11-11 MED ORDER — DARBEPOETIN ALFA-POLYSORBATE 100 MCG/0.5ML IJ SOLN
100.0000 ug | INTRAMUSCULAR | Status: DC
  Administered 2013-11-12: 100 ug via INTRAVENOUS
  Filled 2013-11-11: qty 0.5

## 2013-11-11 NOTE — Consult Note (Signed)
Pt with persistant forearm swelling Will arrange outpt follow up to consider fistulogram  Ruta Hinds, MD [facility] Office: [phone] Pager: [phone]

## 2013-11-11 NOTE — Progress Notes (Signed)
Chest tube sutures have been removed.  Steri-strips applied.  Pt tolerated well.

## 2013-11-11 NOTE — Progress Notes (Signed)
NUTRITION FOLLOW UP  DOCUMENTATION CODES Per approved criteria  -Severe malnutrition in the context of chronic illness   INTERVENTION: Continue Nepro Shake po BID, each supplement provides 425 kcal and 19 grams protein RD to follow for nutrition care plan  NUTRITION DIAGNOSIS: Inadequate oral intake now related to limited appetite as evidenced by RN report, ongoing  Goal: Intake to meet >90% of estimated nutrition needs, progressing  Monitor:  PO & supplemental intake, weight, labs, I/O's  ASSESSMENT: PMHx significant for aortic stenosis, chronic LBBB, thyroid CA post thyroidectomy, ESRD (on HD), HTN, and depression. Recently discharged 3/13 after a one month stay for sepsis, MSSA bacteremia septic brain emboli and presumptive prosthetic valve infective endocarditis. New start to HD as of 2/21. Admitted with septic arthritis and sternal infection/osteomyelitis seeding from his previous bacteremia.  Patient s/p procedures 3/31: REDO STERNOTOMY  REDO BENTALL PROCEDURE WITH HOMOGRAFT DEBRIDEMENT OF ROOT ABSCESS  INTRAOPERATIVE TRANSESOPHAGEAL ECHOCARDIOGRAM  Patient transferred to 2W-Cardiac from 2S-Surgical ICU 4/6.  Patient currently on a Renal/Carbohydrate Modified with 1200 ml fluid restriction 4/8.  Reports his appetite is better.  Daughter at bedside.  PO intake variable at 25-50% per flowsheet records.  He is drinking at least one Nepro Shake per day.  RD encouraged pt to try and drink 2 per day.  Pt amenable.  Height: Ht Readings from Last 1 Encounters:  10/28/13 6' 1.5" (1.867 m)    Weight: Wt Readings from Last 1 Encounters:  11/11/13 190 lb (86.183 kg)    BMI:  Body mass index is 24.72 kg/(m^2).  Re-estimated needs: Kcal: 2100-2250 Protein: 130-145 gm Fluid: per MD  Skin: surgical incisions   Diet Order: Renal/Carbohydrate Modified with 1200 ml fluid restriction   Intake/Output Summary (Last 24 hours) at 11/11/13 1414 Last data filed at 11/10/13 2307  Gross per 24 hour  Intake     80 ml  Output   2700 ml  Net  -2620 ml    Labs:   Recent Labs Lab 11/06/13 0350 11/07/13 0415 11/10/13 1331  NA 139 138 139  K 3.7 3.8 4.3  CL 97 97 97  CO2 27 27 26   BUN 12 10 15   CREATININE 3.14* 3.30* 4.65*  CALCIUM 7.8* 8.2* 8.3*  PHOS  --   --  4.1  GLUCOSE 87 88 114*    Scheduled Meds: . ALPRAZolam  0.5 mg Oral Once  . amiodarone  200 mg Oral Daily  . aspirin EC  81 mg Oral Daily  . [START ON 11/12/2013]  ceFAZolin (ANCEF) IV  2 g Intravenous Q M,W,F-HD  . chlorhexidine  15 mL Mouth Rinse BID  . clonazePAM  0.5 mg Oral BID  . [START ON 11/12/2013] darbepoetin (ARANESP) injection - DIALYSIS  100 mcg Intravenous Q Wed-HD  . feeding supplement (NEPRO CARB STEADY)  237 mL Oral BID BM  . ferrous Q000111Q C-folic acid  1 capsule Oral q morning - 10a  . gentamicin  100 mg Intravenous Q M,W,F-1800  . levothyroxine  200 mcg Oral QAC breakfast  . midodrine  5 mg Oral BID WC  . mirtazapine  15 mg Oral QHS  . pantoprazole  40 mg Oral QAC breakfast  . rifampin  300 mg Oral TID  . sertraline  50 mg Oral Daily  . sodium chloride  3 mL Intravenous Q12H    Continuous Infusions: . sodium chloride 20 mL/hr at 11/04/13 2146    Past Medical History  Diagnosis Date  . Anxiety   . Depression   .  Hypertension   . Hypothyroidism, postsurgical   . Aortic stenosis     s/p Bentall with bioprosthetic AVR 02/2010; Last echo (9/11): Moderate LVH, EF 45-50%, AVR functioning appropriately, aortic valve mean gradient 21, diastolic dysfunction. Chest MRA (2/13): Mild to moderate dilatation at the sinus of Valsalva at 4.1 cm, mild dilatation ascending aorta distal to the tube graft at 3.9 cm, moderate dilatation of the innominate artery a 2.1 cm;    . Hx of cardiac cath     a. LHC in 02/2010: normal cors  . Hx of echocardiogram 2014    Echo (10/14): Severe LVH, EF 60-65%, normal wall motion, grade 1 diastolic dysfunction, AVR functioning normally,  mild aortic stenosis (mean 19), moderately dilated aorta, mild LAE, mild RVE  . Staphylococcus aureus bacteremia   . Prosthetic valve endocarditis   . Thyroid cancer   . Heart murmur   . ESRD on dialysis     "[facility]; TTS" (10/22/2013)    Past Surgical History  Procedure Laterality Date  . Cardiac catheterization  03/02/2010    NORMAL CORONARY ARTERY  . Sternotomy      REDO  . Transthoracic echocardiogram  03/2010    SHOWED MILD REDUCTION OF LV FUNCTION  . Thyroidectomy  ~ 2005  . Shoulder arthroscopy w/ rotator cuff repair Right 2012  . Av fistula placement Left 10/08/2013    Procedure: ARTERIOVENOUS (AV) FISTULA CREATION- LEFT ARM; Radial Cephalic ;  Surgeon: Mal [name], MD;  Location: [facility];  Service: Vascular;  Laterality: Left;  . Aortic valve replacement  2011  . Cardiac valve replacement    . Aortic valve repair  1968  . Tee without cardioversion N/A 10/24/2013    Procedure: TRANSESOPHAGEAL ECHOCARDIOGRAM (TEE);  Surgeon: Dorothy Spark, MD;  Location: [facility] ENDOSCOPY;  Service: Cardiovascular;  Laterality: N/A;  Deneen Harts procedure N/A 10/28/2013    Procedure: REDO BENTALL PROCEDURE, debridment of aoritc root abscess, replacement of aortic root, ascending aorta and aortic valve with homograft. Insertion of left femoral arterial line;  Surgeon: [name], MD;  Location: [facility];  Service: Open Heart Surgery;  Laterality: N/A;  . Intraoperative transesophageal echocardiogram N/A 10/28/2013    Procedure: INTRAOPERATIVE TRANSESOPHAGEAL ECHOCARDIOGRAM;  Surgeon: [name], MD;  Location: [facility];  Service: Open Heart Surgery;  Laterality: N/A;    Arthur Holms, RD, LDN Pager #: [phone] After-Hours Pager #: [phone]

## 2013-11-11 NOTE — Progress Notes (Addendum)
[place] [facility]    Day # 14 beta lactam post Day # 14 rifampin Day # 12 gentamicin   Subjective: Feels better  Antibiotics:  Anti-infectives   Start     Dose/Rate Route Frequency Ordered Stop   11/12/13 1200  ceFAZolin (ANCEF) IVPB 2 g/50 mL premix     2 g 100 mL/hr over 30 Minutes Intravenous Every M-W-F (Hemodialysis) 11/11/13 0854     11/11/13 1800  ceFAZolin (ANCEF) IVPB 2 g/50 mL premix  Status:  Discontinued     2 g 100 mL/hr over 30 Minutes Intravenous Every T-Th-Sa (1800) 11/10/13 1855 11/11/13 0854   11/10/13 2200  rifampin (RIFADIN) capsule 300 mg     300 mg Oral 3 times daily 11/10/13 1813     11/10/13 2000  ceFAZolin (ANCEF) IVPB 2 g/50 mL premix     2 g 100 mL/hr over 30 Minutes Intravenous  Once 11/10/13 1855 11/10/13 2337   11/08/13 1530  gentamicin (GARAMYCIN) IVPB 100 mg     100 mg 200 mL/hr over 30 Minutes Intravenous  Once 11/08/13 1527 11/08/13 1804   11/06/13 1400  nafcillin 2 g in dextrose 5 % 50 mL IVPB  Status:  Discontinued     2 g 100 mL/hr over 30 Minutes Intravenous 6 times per day 11/06/13 1347 11/10/13 1812   11/05/13 1800  gentamicin (GARAMYCIN) IVPB 100 mg     100 mg 200 mL/hr over 30 Minutes Intravenous Every M-W-F (1800) 11/04/13 1317 11/12/13 2359   11/04/13 1300  gentamicin (GARAMYCIN) IVPB 100 mg     100 mg 200 mL/hr over 30 Minutes Intravenous  Once 11/04/13 1146 11/04/13 1320   11/03/13 1200  gentamicin (GARAMYCIN) IVPB 100 mg  Status:  Discontinued     100 mg 200 mL/hr over 30 Minutes Intravenous Every M-W-F (Hemodialysis) 11/01/13 1429 11/04/13 1317   11/02/13 2200  rifampin (RIFADIN) capsule 300 mg  Status:  Discontinued     300 mg Oral 3 times per day 11/02/13 1426 11/10/13 1812   11/01/13 1200  gentamicin (GARAMYCIN) IVPB 100 mg  Status:  Discontinued     100 mg 200 mL/hr over 30 Minutes Intravenous Every T-Th-Sa (Hemodialysis) 10/30/13 1037 11/01/13 1429   10/31/13 1200  gentamicin (GARAMYCIN) IVPB  80 mg  Status:  Discontinued     80 mg 100 mL/hr over 30 Minutes Intravenous Every Dialysis 10/30/13 0047 10/30/13 1027   10/30/13 1300  gentamicin (GARAMYCIN) 120 mg in dextrose 5 % 50 mL IVPB     120 mg 106 mL/hr over 30 Minutes Intravenous  Once 10/30/13 1037 10/30/13 1615   10/30/13 0500  gentamicin (GARAMYCIN) IVPB 80 mg     80 mg 100 mL/hr over 30 Minutes Intravenous  Once 10/30/13 0047 10/30/13 0526   10/29/13 1600  levofloxacin (LEVAQUIN) IVPB 500 mg  Status:  Discontinued     500 mg 100 mL/hr over 60 Minutes Intravenous Every 48 hours 10/27/13 1457 10/29/13 0857   10/29/13 1000  rifampin (RIFADIN) 300 mg in sodium chloride 0.9 % 100 mL IVPB     300 mg 200 mL/hr over 30 Minutes Intravenous 3 times per day 10/29/13 0858 11/02/13 1520   10/29/13 1000  nafcillin 2 g in dextrose 5 % 50 mL IVPB  Status:  Discontinued     2 g 100 mL/hr over 30 Minutes Intravenous 6 times per day 10/29/13 0901 11/06/13 1347   10/29/13 0900  nafcillin injection 2 g  Status:  Discontinued     2 g Intravenous Every 4 hours 10/29/13 0857 10/29/13 0900   10/29/13 0500  vancomycin (VANCOCIN) IVPB 1000 mg/200 mL premix     1,000 mg 200 mL/hr over 60 Minutes Intravenous  Once 10/28/13 2146 10/29/13 0600   10/29/13 0100  cefUROXime (ZINACEF) 1.5 g in dextrose 5 % 50 mL IVPB  Status:  Discontinued     1.5 g 100 mL/hr over 30 Minutes Intravenous Every 12 hours 10/28/13 2146 10/29/13 0857   10/28/13 0400  vancomycin (VANCOCIN) 1,250 mg in sodium chloride 0.9 % 250 mL IVPB     1,250 mg 166.7 mL/hr over 90 Minutes Intravenous To Surgery 10/27/13 1450 10/28/13 0905   10/28/13 0400  cefUROXime (ZINACEF) 1.5 g in dextrose 5 % 50 mL IVPB     1.5 g 100 mL/hr over 30 Minutes Intravenous To Surgery 10/27/13 1450 10/28/13 2030   10/28/13 0400  cefUROXime (ZINACEF) 750 mg in dextrose 5 % 50 mL IVPB  Status:  Discontinued     750 mg 100 mL/hr over 30 Minutes Intravenous To Surgery 10/27/13 1447 10/28/13 2114   10/27/13  1600  levofloxacin (LEVAQUIN) IVPB 750 mg     750 mg 100 mL/hr over 90 Minutes Intravenous  Once 10/27/13 1456 10/28/13 0231   10/23/13 0800  ceFAZolin (ANCEF) IVPB 1 g/50 mL premix  Status:  Discontinued     1 g 100 mL/hr over 30 Minutes Intravenous  Once 10/22/13 2313 10/22/13 2326   10/22/13 2359  ceFAZolin (ANCEF) IVPB 2 g/50 mL premix  Status:  Discontinued     2 g 100 mL/hr over 30 Minutes Intravenous Every M-W-F (Hemodialysis) 10/22/13 2314 10/28/13 2146   10/22/13 2315  ceFAZolin (ANCEF) IVPB 2 g/50 mL premix  Status:  Discontinued     2 g 100 mL/hr over 30 Minutes Intravenous  Once 10/22/13 2313 10/22/13 2326      Medications: Scheduled Meds: . ALPRAZolam  0.5 mg Oral Once  . amiodarone  200 mg Oral Daily  . aspirin EC  81 mg Oral Daily  . [START ON 11/12/2013]  ceFAZolin (ANCEF) IV  2 g Intravenous Q M,W,F-HD  . chlorhexidine  15 mL Mouth Rinse BID  . clonazePAM  0.5 mg Oral BID  . [START ON 11/12/2013] darbepoetin (ARANESP) injection - DIALYSIS  100 mcg Intravenous Q Wed-HD  . feeding supplement (NEPRO CARB STEADY)  237 mL Oral BID BM  . ferrous Q000111Q C-folic acid  1 capsule Oral q morning - 10a  . gentamicin  100 mg Intravenous Q M,W,F-1800  . levothyroxine  200 mcg Oral QAC breakfast  . midodrine  5 mg Oral BID WC  . mirtazapine  15 mg Oral QHS  . pantoprazole  40 mg Oral QAC breakfast  . rifampin  300 mg Oral TID  . sertraline  50 mg Oral Daily  . sodium chloride  3 mL Intravenous Q12H   Continuous Infusions: . sodium chloride 20 mL/hr at 11/04/13 2146   PRN Meds:.sodium chloride, albumin human, ALPRAZolam, ondansetron (ZOFRAN) IV, ondansetron, sodium chloride   Objective: Weight change: 1 lb 15.8 oz (0.9 kg)  Intake/Output Summary (Last 24 hours) at 11/11/13 1644 Last data filed at 11/10/13 2307  Gross per 24 hour  Intake     80 ml  Output   2700 ml  Net  -2620 ml   Blood pressure 101/64, pulse 101, temperature 97.8 F (36.6 C),  temperature source Oral, resp. rate 18, height 6' 1.5" (1.867 m), weight  190 lb (86.183 kg), SpO2 98.00%. Temp:  [97.7 F (36.5 C)-98.9 F (37.2 C)] 97.8 F (36.6 C) (04/14 1511) Pulse Rate:  [81-101] 101 (04/14 1604) Resp:  [18-20] 18 (04/14 1604) BP: (101-122)/(64-85) 101/64 mmHg (04/14 1604) SpO2:  [95 %-99 %] 98 % (04/14 1604) Weight:  [190 lb (86.183 kg)-191 lb 2.2 oz (86.7 kg)] 190 lb (86.183 kg) (04/14 0704)  Physical Exam: General:aox 3 ,NAD CV: RRR, systolic murmur Pulm: CTA anteriorly  NEuro : nonfocal  Neuro nonfocal Lab Results: No results found for this basename: WBC, HGB, HCT, PLT,  in the last 72 hours  BMET  Recent Labs  11/10/13 1331  NA 139  K 4.3  CL 97  CO2 26  GLUCOSE 114*  BUN 15  CREATININE 4.65*  CALCIUM 8.3*    Micro Results: Recent Results (from the past 240 hour(s))  CLOSTRIDIUM DIFFICILE BY PCR     Status: None   Collection Time    11/02/13 10:04 AM      Result Value Ref Range Status   C difficile by pcr NEGATIVE  NEGATIVE Final  URINE CULTURE     Status: None   Collection Time    11/04/13  9:45 AM      Result Value Ref Range Status   Specimen Description URINE, CLEAN CATCH   Final   Special Requests Nafcillin, Rifampin, Gent Normal   Final   Culture  Setup Time     Final   Value: 11/04/2013 10:58     Performed at [facility]     Final   Value: NO GROWTH     Performed at [facility]   Culture     Final   Value: NO GROWTH     Performed at [facility]   Report Status 11/05/2013 FINAL   Final    Studies/Results: No results found.    Assessment/Plan: Bradley [name] is a 58 y.o. 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  --continue ANCEF 2 G with HD and  Oral rifampin   300mg  tid  FOR TOTAL OF  X 8  Weeks post OP--> June 3rd as the last day of Ancef and Rifampin  And Gentamicin for 2 weeks postop with last day for this will be this Friday April 14th (he did not start postop gentamicin until a few days post surgery  I will arrange for HSFU in my clinic  I spent greater than 40 minutes with the patient including greater than 50% of time in face to face counsel of the patient and in coordination of their care.   I will sign off for now  Please call with further questions.           LOS: 20 days   Truman Hayward 11/11/2013, 4:44 PM

## 2013-11-11 NOTE — Progress Notes (Addendum)
       [id number]       [place] [address]             [phone]          14 Days Post-Op Procedure(s) (LRB): REDO STERNOTOMY (N/A) REDO BENTALL PROCEDURE, debridment of aoritc root abscess, replacement of aortic root, ascending aorta and aortic valve with homograft. Insertion of left femoral arterial line (N/A) INTRAOPERATIVE TRANSESOPHAGEAL ECHOCARDIOGRAM (N/A)  Subjective: Rested the best last night of any night since he has been in the hospital.  Overall, feeling better today. Just walked with PT and did very well.   Objective: Vital signs in last 24 hours: Patient Vitals for the past 24 hrs:  BP Temp Temp src Pulse Resp SpO2 Weight  11/11/13 0704 115/84 mmHg 98.9 F (37.2 C) Oral 93 18 96 % 190 lb (86.183 kg)  11/10/13 2107 115/78 mmHg 98.5 F (36.9 C) Oral 96 18 95 % -  11/10/13 1652 122/85 mmHg 97.7 F (36.5 C) Oral 98 20 99 % 191 lb 2.2 oz (86.7 kg)  11/10/13 1630 125/92 mmHg - - 94 21 - -  11/10/13 1600 118/85 mmHg - - 91 20 - -  11/10/13 1530 120/88 mmHg - - 90 23 - -  11/10/13 1500 108/84 mmHg - - 87 21 - -  11/10/13 1429 113/86 mmHg - - 90 16 - -  11/10/13 1400 121/82 mmHg - - 90 16 - -  11/10/13 1330 100/78 mmHg - - 91 13 - -  11/10/13 1326 115/81 mmHg - - 90 13 - -  11/10/13 1321 115/82 mmHg 97.6 F (36.4 C) Oral 92 16 99 % 197 lb 1.5 oz (89.4 kg)   Current Weight  11/11/13 190 lb (86.183 kg)     Intake/Output from previous day: 04/13 0701 - 04/14 0700 In: 80 [I.V.:30; IV Piggyback:50] Out: 2700     PHYSICAL EXAM:  Heart: RRR Lungs: Clear Wound: Clean and dry Extremities: No LE edema    Lab Results: CBC:No results found for this basename: WBC, HGB, HCT, PLT,  in the last 72 hours BMET:  Recent Labs  11/10/13 1331  NA 139  K 4.3  CL 97  CO2 26  GLUCOSE 114*  BUN 15  CREATININE 4.65*  CALCIUM 8.3*    PT/INR: No results found for this basename: LABPROT, INR,  in the last 72 hours    Assessment/Plan: S/P  Procedure(s) (LRB): REDO STERNOTOMY (N/A) REDO BENTALL PROCEDURE, debridment of aoritc root abscess, replacement of aortic root, ascending aorta and aortic valve with homograft. Insertion of left femoral arterial line (N/A) INTRAOPERATIVE TRANSESOPHAGEAL ECHOCARDIOGRAM (N/A)  CV- SR, BPs stable. Continue current meds.  ID- antibiotic regimen changed to Ancef/Gentamycin/Rifampin.   ESRD- back on TTS HD schedule.  Deconditioning- PT/OT.  Disp- SNF bed search in progress. Hopefully ready for d/c soon.    LOS: 20 days    Coolidge Breeze 11/11/2013  Turned down for inpatient rehab Likely will go home with home nurse,  Antibiotics will need to be given with dialysis, ID and Renal will need to coordinate  This Home 1-2 days I have seen and examined [name] and agree with the above assessment  and plan.  [name] MD Beeper [phone] Office [phone] 11/11/2013 10:08 AM

## 2013-11-11 NOTE — Progress Notes (Signed)
Occupational Therapy Treatment Patient Details Name: Bradley [name] MRN: [id number] DOB: 1956-01-28 Today's Date: 11/11/2013    History of present illness Pt admit with sepsis and r/o endocarditis.  Anoxic brain injury.  Underwent redo of sternotomy and Bentall procedure 10/28/13   OT comments  Pt. Found in bathroom upon arrival with no RW present.  States he walked to bathroom on his own.  Reviewed at length need for assistance with all mobility.  Pt. Verbalized understanding.  States he is feeling good today.  No c/o pain, reports increased energy.  Motivated during session.  Following instructions for walker safety during grooming tasks at sink.  PA present at end of session, reports pt. Is stable and likely D/C next few days.    Follow Up Recommendations  CIR;Supervision/Assistance - 24 hour    Equipment Recommendations  None recommended by OT    Recommendations for Other Services Rehab consult    Precautions / Restrictions Precautions Precautions: Fall;Sternal Precaution Comments: Pt needs reminders for sternal precautions.        Mobility Bed Mobility: pt. Up in room upon arrival                  Transfers Overall transfer level: Needs assistance Equipment used: Rolling walker (2 wheeled) Transfers: Sit to/from Stand Sit to Stand: Mod assist Stand pivot transfers: Min assist       General transfer comment: cues for sternal precautions, assist to stand from low chair                                       ADL Overall ADL's : Needs assistance/impaired     Grooming: Wash/dry hands;Supervision/safety;Standing                   Toilet Transfer: Moderate assistance;Ambulation;Regular Glass blower/designer Details (indicate cue type and reason): mod a sit/stand secondary to pt. very tall, wtih lower toilet while trying to maintain sternal precautions Toileting- Clothing Manipulation and Hygiene: Supervision/safety;Sitting/lateral lean         General ADL Comments: inst. cues for walker safety while standing at sink.                                                                                                      Pertinent Vitals/ Pain       VSS, no c/o pain                                                          Frequency Min 3X/week     Progress Toward Goals  OT Goals(current goals can now be found in the care plan section)  Progress towards OT goals: Progressing toward goals     Plan Discharge plan remains appropriate  End of Session Equipment Utilized During Treatment: Rolling walker   Activity Tolerance Patient tolerated treatment well   Patient Left  in chair, call bell in reach             Time: 0808-0825 OT Time Calculation (min): 17 min  Charges: OT General Charges $OT Visit: 1 Procedure OT Treatments $Self Care/Home Management : 8-22 mins  Rico Junker Pius Byrom, COTA/L 11/11/2013, 8:51 AM

## 2013-11-11 NOTE — Progress Notes (Signed)
CSW consulted Oceanographer to talk about patient. CSW reviewed chart and spoke with MD team in reference to SNF. At this time, there are no bed offers for patient and according to Cardiac Rehab's note, patient is okay with Home Health and CM is working with patient in setting up [facility]. CSW signing off at this time. Please re consult if social work needs arise.  Jeanette Caprice, MSW, [place]

## 2013-11-11 NOTE — Progress Notes (Signed)
CARDIAC REHAB PHASE I   PRE:  Rate/Rhythm: 88 SR    BP: sitting 123/79    SaO2:   MODE:  Ambulation: 500 ft   POST:  Rate/Rhythm: 105 ST    BP: sitting 124/81     SaO2:   Pt walked earlier but ready to walk again. Pt continues with slight unsteadiness, better with RW. Sts he has some pain and calluses on bottom of feet that are contributing.  Increased distance, good tolerance. To bed afterward, sts HD was hard on him yesterday. In discussion with pt, he has his wife and 2 daughters at home. He would like to go home with HHRN/PT, RW and BSC. Pt is progressing well and feel this is a safe option. Notified CM.  [id number]  Nicky Pugh CES, ACSM 11/11/2013 11:04 AM

## 2013-11-11 NOTE — Progress Notes (Signed)
Physical Therapy Treatment Patient Details Name: Bradley [name] MRN: [id number] DOB: [birth year] Today's Date: 11/11/2013    History of Present Illness Pt admit with sepsis and r/o endocarditis.  Anoxic brain injury.  Underwent redo of sternotomy and Bentall procedure 10/28/13 due to aortic root abscess    PT Comments    Pt remains weak and requiring 24 hour supervision for safety (does not recall sternal precautions and has gotten up by himself against advice). Son present and does not feel family can provide the assistance the patient currently needs and was discussing SNF placement with Dr. Tommy Medal on arrival. Discussed discharge plan with pt and son at end of session, and pt agreeable to SNF short-term (he was concerned initially that he would be required to stay 30 days).    Follow Up Recommendations  SNF;Supervision/Assistance - 24 hour (denial from inpt rehab; family wants SNF)     Equipment Recommendations  Rolling walker with 5" wheels;3in1 (PT)    Recommendations for Other Services       Precautions / Restrictions Precautions Precautions: Fall;Sternal Precaution Comments: Pt needs reminders for sternal precautions. He is able to state them.    Mobility  Bed Mobility Overal bed mobility: Needs Assistance Bed Mobility: Rolling;Sidelying to Sit Rolling: Supervision Sidelying to sit: Min assist       General bed mobility comments: HOB 0 and no rail; required cues for sequencing and to maintain sternal precautions (pt struggling unsuccessfully to pull up supine to sit and pushing with elbows)  Transfers Overall transfer level: Needs assistance Equipment used: Rolling walker (2 wheeled) Transfers: Sit to/from Stand Sit to Stand: Mod assist         General transfer comment: cues for sternal precautions, assist to stand from low chair and standard toilet without use of UEs  Ambulation/Gait Ambulation/Gait assistance: Min assist Ambulation Distance (Feet): 250  Feet Assistive device: Rolling walker (2 wheeled) Gait Pattern/deviations: Step-through pattern;Drifts right/left Gait velocity: requires cues to incr velocity   General Gait Details: initial lateral instability when attempted without RW; with RW, drifts Rt and Lt, especially with head turns   Science writer    Modified Rankin (Stroke Patients Only)       Balance           Standing balance support: No upper extremity supported Standing balance-Leahy Scale: Fair                      Cognition Arousal/Alertness: Awake/alert Behavior During Therapy: WFL for tasks assessed/performed Overall Cognitive Status: Impaired/Different from baseline Area of Impairment: Memory;Attention;Safety/judgement   Current Attention Level: Sustained Memory: Decreased short-term memory;Decreased recall of precautions   Safety/Judgement: Decreased awareness of safety;Decreased awareness of deficits Awareness: Emergent Problem Solving: Requires verbal cues General Comments: up to bathroom without assist or walker earlier today; reports he needed to use the bathroom and could not wait. Explained risks of fall/injury and pt admitted he didn't think it would happen.Frequent cues to maintain sternal precautions    Exercises General Exercises - Lower Extremity Hip ABduction/ADduction: AROM;Both;10 reps;Strengthening;Standing (resisted manually) Toe Raises: AROM;Both;5 reps;Standing (light UE support on RW) Heel Raises: AROM;Both;5 reps;Standing (light UE use on RW) Other Exercises Other Exercises: full squats to near sitting on chair and return to stand x 5 without use of UEs    General Comments General comments (skin integrity, edema, etc.): Son present and discussing d/c options with MD (Dr. Lucianne Lei  Damm) on arrival. Son very concerned re: possibility that pt will be discharged home in a few days. Son states pt's wife has exhausted her leave from work and they will  have to arrange for family to coordinate staying with pt during the day. He is also very concerned that the pt is still incontinent      Pertinent Vitals/Pain Denies pain     Home Living                      Prior Function            PT Goals (current goals can now be found in the care plan section) Acute Rehab PT Goals PT Goal Formulation: With patient Time For Goal Achievement: 11/18/13 Potential to Achieve Goals: Good Progress towards PT goals: Progressing toward goals (goals updated based on timeframe)    Frequency  Min 3X/week    PT Plan Discharge plan needs to be updated    Co-evaluation             End of Session Equipment Utilized During Treatment: Gait belt Activity Tolerance: Patient tolerated treatment well Patient left: in chair;with call bell/phone within reach;with family/visitor present     Time: [id number] PT Time Calculation (min): 42 min  Charges:  $Gait Training: 23-37 mins $Therapeutic Exercise: 8-22 mins                    G Codes:      Jeanie Cooks Fate Caster Nov 24, 2013, 3:15 PM

## 2013-11-11 NOTE — Progress Notes (Signed)
S:Feels better this AM.  2.7L removed at HD yest O:BP 115/84  Pulse 93  Temp(Src) 98.9 F (37.2 C) (Oral)  Resp 18  Ht 6' 1.5" (1.867 m)  Wt 86.7 kg (191 lb 2.2 oz)  BMI 24.87 kg/m2  SpO2 96%  Intake/Output Summary (Last 24 hours) at 11/11/13 0728 Last data filed at 11/10/13 2307  Gross per 24 hour  Intake     80 ml  Output   2700 ml  Net  -2620 ml   Weight change: 0.9 kg (1 lb 15.8 oz) [id number] and alert CVS:RRR Resp:Bronchial BS Lt base Abd:+ BS NTND Ext:no edema  Swollen Lt arm post AVF placement + bruit NEURO:CNI OX3 no asterixis    . ALPRAZolam  0.5 mg Oral Once  . amiodarone  200 mg Oral Daily  . aspirin EC  81 mg Oral Daily  .  ceFAZolin (ANCEF) IV  2 g Intravenous Q T,Th,Sat-1800  . chlorhexidine  15 mL Mouth Rinse BID  . clonazePAM  0.5 mg Oral BID  . darbepoetin (ARANESP) injection - DIALYSIS  100 mcg Intravenous Q Tue-HD  . docusate sodium  200 mg Oral Daily  . feeding supplement (NEPRO CARB STEADY)  237 mL Oral BID BM  . ferrous Q000111Q C-folic acid  1 capsule Oral q morning - 10a  . gentamicin  100 mg Intravenous Q M,W,F-1800  . levothyroxine  200 mcg Oral QAC breakfast  . midodrine  5 mg Oral BID WC  . mirtazapine  15 mg Oral QHS  . pantoprazole  40 mg Oral QAC breakfast  . rifampin  300 mg Oral TID  . sertraline  50 mg Oral Daily  . sodium chloride  3 mL Intravenous Q12H   No results found. BMET    Component Value Date/Time   NA 139 11/10/2013 1331   K 4.3 11/10/2013 1331   CL 97 11/10/2013 1331   CO2 26 11/10/2013 1331   GLUCOSE 114* 11/10/2013 1331   BUN 15 11/10/2013 1331   CREATININE 4.65* 11/10/2013 1331   CALCIUM 8.3* 11/10/2013 1331   GFRNONAA 13* 11/10/2013 1331   GFRAA 15* 11/10/2013 1331   CBC    Component Value Date/Time   WBC 7.4 11/07/2013 0415   RBC 2.93* 11/07/2013 0415   HGB 8.5* 11/07/2013 0415   HCT 25.1* 11/07/2013 0415   PLT 245 11/07/2013 0415   MCV 85.7 11/07/2013 0415   MCH 29.0 11/07/2013 0415   MCHC 33.9  11/07/2013 0415   RDW 20.7* 11/07/2013 0415   LYMPHSABS 0.8 09/19/2013 0455   MONOABS 0.5 09/19/2013 0455   EOSABS 0.1 09/19/2013 0455   BASOSABS 0.0 09/19/2013 0455     Assessment: 1.AVR and debridement aortic root abscess 2. Anemia on aranesp 3. Mild sec HPTH not requiring Vit D 4. Hypotension on midodrine  Plan: 1.  SW working on disposition 2. Plan HD in AM.  He was on TTS at [facility] 3. Check iron studies   Windy Kalata

## 2013-11-12 LAB — CBC
HCT: 24.3 % — ABNORMAL LOW (ref 39.0–52.0)
Hemoglobin: 8.1 g/dL — ABNORMAL LOW (ref 13.0–17.0)
MCH: 29.3 pg (ref 26.0–34.0)
MCHC: 33.3 g/dL (ref 30.0–36.0)
MCV: 88 fL (ref 78.0–100.0)
Platelets: 347 10*3/uL (ref 150–400)
RBC: 2.76 MIL/uL — ABNORMAL LOW (ref 4.22–5.81)
RDW: 21.9 % — ABNORMAL HIGH (ref 11.5–15.5)
WBC: 10.2 10*3/uL (ref 4.0–10.5)

## 2013-11-12 LAB — RENAL FUNCTION PANEL
Albumin: 1.7 g/dL — ABNORMAL LOW (ref 3.5–5.2)
BUN: 15 mg/dL (ref 6–23)
CO2: 26 mEq/L (ref 19–32)
Calcium: 8.8 mg/dL (ref 8.4–10.5)
Chloride: 96 mEq/L (ref 96–112)
Creatinine, Ser: 4.63 mg/dL — ABNORMAL HIGH (ref 0.50–1.35)
GFR calc Af Amer: 15 mL/min — ABNORMAL LOW (ref >90)
GFR calc non Af Amer: 13 mL/min — ABNORMAL LOW (ref >90)
Glucose, Bld: 84 mg/dL (ref 70–99)
Phosphorus: 4.9 mg/dL — ABNORMAL HIGH (ref 2.3–4.6)
Potassium: 3.9 mEq/L (ref 3.7–5.3)
Sodium: 138 mEq/L (ref 137–147)

## 2013-11-12 LAB — IRON AND TIBC
Iron: 26 ug/dL — ABNORMAL LOW (ref 42–135)
Saturation Ratios: 23 % (ref 20–55)
TIBC: 115 ug/dL — ABNORMAL LOW (ref 215–435)
UIBC: 89 ug/dL — ABNORMAL LOW (ref 125–400)

## 2013-11-12 LAB — FERRITIN: Ferritin: 502 ng/mL — ABNORMAL HIGH (ref 22–322)

## 2013-11-12 MED ORDER — ALPRAZOLAM 0.5 MG PO TABS
ORAL_TABLET | ORAL | Status: AC
  Filled 2013-11-12: qty 1

## 2013-11-12 MED ORDER — DARBEPOETIN ALFA-POLYSORBATE 100 MCG/0.5ML IJ SOLN
INTRAMUSCULAR | Status: AC
  Filled 2013-11-12: qty 0.5

## 2013-11-12 NOTE — Progress Notes (Signed)
Patient ambulated approximately 150 ft around the circle this morning with RN and RW and tolerated well.  Steady on his feet and HR stable.  No SOB.  Left in room following the walk back in bed with call bell within reach.  Will continue to monitor and encourage.  Randell Patient

## 2013-11-12 NOTE — Procedures (Signed)
Pt seen on HD.  Ap 130 Vp 140.  BFR 400.  Pt says he is going home today?   Dr Lucianne Lei Dam's says gent to finish Fri April 14th?  Will need to get clarification.

## 2013-11-12 NOTE — Progress Notes (Addendum)
       [address]       ,[place] [address]             [phone]          15 Days Post-Op Procedure(s) (LRB): REDO STERNOTOMY (N/A) REDO BENTALL PROCEDURE, debridment of aoritc root abscess, replacement of aortic root, ascending aorta and aortic valve with homograft. Insertion of left femoral arterial line (N/A) INTRAOPERATIVE TRANSESOPHAGEAL ECHOCARDIOGRAM (N/A)  Subjective: Pt seen on HD.  No complaints today except few loose stools.  Wants to go home.   Objective: Vital signs in last 24 hours: Patient Vitals for the past 24 hrs:  BP Temp Temp src Pulse Resp SpO2 Weight  11/12/13 0930 93/75 mmHg - - 104 - - -  11/12/13 0900 97/70 mmHg - - 100 - - -  11/12/13 0830 102/75 mmHg - - 99 - - -  11/12/13 0800 123/83 mmHg - - 91 - - -  11/12/13 0734 116/82 mmHg - - 88 - - -  11/12/13 0729 119/83 mmHg - - 87 - - -  11/12/13 0715 114/82 mmHg 98.3 F (36.8 C) Oral 87 20 98 % 189 lb 2.5 oz (85.8 kg)  11/12/13 0515 110/65 mmHg 98.6 F (37 C) Oral 91 18 98 % -  11/12/13 0400 - - - - - - 190 lb 4.8 oz (86.32 kg)  11/11/13 2008 114/74 mmHg 98.5 F (36.9 C) Oral 93 18 92 % -  11/11/13 1722 109/74 mmHg - - 98 - - -  11/11/13 1604 101/64 mmHg - - 101 18 98 % -  11/11/13 1511 112/72 mmHg 97.8 F (36.6 C) Oral 81 20 - -   Current Weight  11/12/13 189 lb 2.5 oz (85.8 kg)     Intake/Output from previous day: 04/14 0701 - 04/15 0700 In: 240 [P.O.:240] Out: -     PHYSICAL EXAM:  Heart: RRR Lungs: Clear Wound: Clean and dry Extremities: No LE edema    Lab Results: CBC: Recent Labs  11/12/13 0743  WBC 10.2  HGB 8.1*  HCT 24.3*  PLT 347   BMET:  Recent Labs  11/10/13 1331 11/12/13 0743  NA 139 138  K 4.3 3.9  CL 97 96  CO2 26 26  GLUCOSE 114* 84  BUN 15 15  CREATININE 4.65* 4.63*  CALCIUM 8.3* 8.8    PT/INR: No results found for this basename: LABPROT, INR,  in the last 72 hours    Assessment/Plan: S/P Procedure(s) (LRB): REDO  STERNOTOMY (N/A) REDO BENTALL PROCEDURE, debridment of aoritc root abscess, replacement of aortic root, ascending aorta and aortic valve with homograft. Insertion of left femoral arterial line (N/A) INTRAOPERATIVE TRANSESOPHAGEAL ECHOCARDIOGRAM (N/A)  CV- SR, BPs stable. Continue current meds.   ID- Continue Ancef/Rifampin until 6/3, Gentamicin until 4/14.  ESRD- per renal.     Deconditioning- PT/OT.   Disp- Not a candidate for CIR or SNF.  Will plan d/c home in am with [facility] and abx at HD.    LOS: 21 days    Coolidge Breeze 11/12/2013 Plan d/c tomorrow I have seen and examined Bradley [name] and agree with the above assessment  and plan.  [name] MD Beeper [phone] Office [phone]

## 2013-11-12 NOTE — Progress Notes (Signed)
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  If questions, please call  [facility] [phone] cell

## 2013-11-12 NOTE — Progress Notes (Signed)
CARDIAC REHAB PHASE I   PRE:  Rate/Rhythm: 93 SR    BP: sitting 117/81    SaO2: 95 RA  MODE:  Ambulation: 330 ft   POST:  Rate/Rhythm: 110 ST    BP: sitting 130/87     SaO2: 97 RA  Tolerated well with RW. Pt sts he is tired from HD so did not go as far as normal. Return to bed. Wife present. Will f/u am. [place], ACSM 11/12/2013 3:20 PM

## 2013-11-13 ENCOUNTER — Telehealth: Payer: Self-pay | Admitting: Vascular Surgery

## 2013-11-13 MED ORDER — AMIODARONE HCL 200 MG PO TABS: 200.0000 mg | ORAL_TABLET | Freq: Every day | ORAL | Status: DC

## 2013-11-13 MED ORDER — MIRTAZAPINE 15 MG PO TBDP: 15.0000 mg | ORAL_TABLET | Freq: Every day | ORAL | Status: DC

## 2013-11-13 MED ORDER — GENTAMICIN IN SALINE 1-0.9 MG/ML-% IV SOLN: 100.0000 mg | INTRAVENOUS | Status: DC

## 2013-11-13 MED ORDER — FE FUMARATE-B12-VIT C-FA-IFC PO CAPS: 1.0000 | ORAL_CAPSULE | Freq: Every day | ORAL | Status: DC

## 2013-11-13 MED ORDER — GENTAMICIN IN SALINE 1-0.9 MG/ML-% IV SOLN: INTRAVENOUS | Status: DC

## 2013-11-13 MED ORDER — NEPRO/CARBSTEADY PO LIQD: 237.0000 mL | Freq: Two times a day (BID) | ORAL | Status: DC

## 2013-11-13 MED ORDER — CEFAZOLIN SODIUM-DEXTROSE 2-3 GM-% IV SOLR: 2.0000 g | INTRAVENOUS | Status: DC

## 2013-11-13 MED ORDER — GENTAMICIN IN SALINE 1-0.9 MG/ML-% IV SOLN: 100.0000 mg | Freq: Once | INTRAVENOUS | Status: DC

## 2013-11-13 MED ORDER — RIFAMPIN 300 MG PO CAPS: 300.0000 mg | ORAL_CAPSULE | Freq: Three times a day (TID) | ORAL | Status: DC

## 2013-11-13 MED ORDER — MIDODRINE HCL 5 MG PO TABS: 5.0000 mg | ORAL_TABLET | Freq: Two times a day (BID) | ORAL | Status: DC

## 2013-11-13 NOTE — Progress Notes (Signed)
S: No new CO.  Says he is ready to go home O:BP 116/76  Pulse 91  Temp(Src) 98.4 F (36.9 C) (Oral)  Resp 18  Ht 6' 1.5" (1.867 m)  Wt 83.4 kg (183 lb 13.8 oz)  BMI 23.93 kg/m2  SpO2 96%  Intake/Output Summary (Last 24 hours) at 11/13/13 0723 Last data filed at 11/12/13 1300  Gross per 24 hour  Intake    240 ml  Output   2538 ml  Net  -2298 ml   Weight change: -0.384 kg (-13.5 oz) [id number] and alert CVS:RRR Resp:Bronchial BS Lt base Abd:+ BS NTND Ext:no edema  Swollen Lt arm post AVF placement + bruit NEURO:CNI OX3 no asterixis    . ALPRAZolam  0.5 mg Oral Once  . amiodarone  200 mg Oral Daily  . aspirin EC  81 mg Oral Daily  .  ceFAZolin (ANCEF) IV  2 g Intravenous Q M,W,F-HD  . chlorhexidine  15 mL Mouth Rinse BID  . clonazePAM  0.5 mg Oral BID  . darbepoetin (ARANESP) injection - DIALYSIS  100 mcg Intravenous Q Wed-HD  . feeding supplement (NEPRO CARB STEADY)  237 mL Oral BID BM  . ferrous [id number] C-folic acid  1 capsule Oral q morning - 10a  . levothyroxine  200 mcg Oral QAC breakfast  . midodrine  5 mg Oral BID WC  . mirtazapine  15 mg Oral QHS  . pantoprazole  40 mg Oral QAC breakfast  . rifampin  300 mg Oral TID  . sertraline  50 mg Oral Daily  . sodium chloride  3 mL Intravenous Q12H   No results found. BMET    Component Value Date/Time   NA 138 11/12/2013 0743   K 3.9 11/12/2013 0743   CL 96 11/12/2013 0743   CO2 26 11/12/2013 0743   GLUCOSE 84 11/12/2013 0743   BUN 15 11/12/2013 0743   CREATININE 4.63* 11/12/2013 0743   CALCIUM 8.8 11/12/2013 0743   GFRNONAA 13* 11/12/2013 0743   GFRAA 15* 11/12/2013 0743   CBC    Component Value Date/Time   WBC 10.2 11/12/2013 0743   RBC 2.76* 11/12/2013 0743   HGB 8.1* 11/12/2013 0743   HCT 24.3* 11/12/2013 0743   PLT 347 11/12/2013 0743   MCV 88.0 11/12/2013 0743   MCH 29.3 11/12/2013 0743   MCHC 33.3 11/12/2013 0743   RDW 21.9* 11/12/2013 0743   LYMPHSABS 0.8 09/19/2013 0455   MONOABS 0.5 09/19/2013 0455    EOSABS 0.1 09/19/2013 0455   BASOSABS 0.0 09/19/2013 0455     Assessment: 1.AVR and debridement aortic root abscess 2. Anemia on aranesp 3. Mild sec HPTH not requiring Vit D 4. Hypotension on midodrine  Plan: 1.  Note plans for DC.  Will cont ancef and gent at the Dx unit.  His next HD will be Sat to get him back on schedule   Windy Kalata

## 2013-11-13 NOTE — Progress Notes (Addendum)
       [address]       [place] [address]             [phone]          16 Days Post-Op Procedure(s) (LRB): REDO STERNOTOMY (N/A) REDO BENTALL PROCEDURE, debridment of aoritc root abscess, replacement of aortic root, ascending aorta and aortic valve with homograft. Insertion of left femoral arterial line (N/A) INTRAOPERATIVE TRANSESOPHAGEAL ECHOCARDIOGRAM (N/A)  Subjective: Feels better today, no complaints.  Walked after HD yesterday, no dyspnea or CP.   Objective: Vital signs in last 24 hours: Patient Vitals for the past 24 hrs:  BP Temp Temp src Pulse Resp SpO2 Weight  11/13/13 0503 - - - - - - 183 lb 13.8 oz (83.4 kg)  11/13/13 0459 116/76 mmHg 98.4 F (36.9 C) Oral 91 18 96 % -  11/12/13 2118 118/79 mmHg 99.3 F (37.4 C) Oral 90 18 98 % -  11/12/13 1709 116/77 mmHg - - 97 - - -  11/12/13 1409 111/79 mmHg 98.1 F (36.7 C) Oral 94 18 99 % -  11/12/13 1154 111/67 mmHg - - 93 - - -  11/12/13 1129 102/76 mmHg 98.4 F (36.9 C) Oral 96 20 99 % 183 lb 13.8 oz (83.4 kg)  11/12/13 1100 106/76 mmHg - - 96 - - -  11/12/13 1030 100/74 mmHg - - 96 - - -  11/12/13 1000 100/73 mmHg - - 98 - - -  11/12/13 0930 93/75 mmHg - - 104 - - -  11/12/13 0900 97/70 mmHg - - 100 - - -  11/12/13 0830 102/75 mmHg - - 99 - - -  11/12/13 0800 123/83 mmHg - - 91 - - -   Current Weight  11/13/13 183 lb 13.8 oz (83.4 kg)     Intake/Output from previous day: 04/15 0701 - 04/16 0700 In: 240 [P.O.:240] Out: 2538     PHYSICAL EXAM:  Heart: RRR Lungs: Slightly diminished BS in bases, overall clear Wound: Clean and dry Extremities: No LE edema    Lab Results: CBC: Recent Labs  11/12/13 0743  WBC 10.2  HGB 8.1*  HCT 24.3*  PLT 347   BMET:  Recent Labs  11/10/13 1331 11/12/13 0743  NA 139 138  K 4.3 3.9  CL 97 96  CO2 26 26  GLUCOSE 114* 84  BUN 15 15  CREATININE 4.65* 4.63*  CALCIUM 8.3* 8.8    PT/INR: No results found for this basename:  LABPROT, INR,  in the last 72 hours    Assessment/Plan: S/P Procedure(s) (LRB): REDO STERNOTOMY (N/A) REDO BENTALL PROCEDURE, debridment of aoritc root abscess, replacement of aortic root, ascending aorta and aortic valve with homograft. Insertion of left femoral arterial line (N/A) INTRAOPERATIVE TRANSESOPHAGEAL ECHOCARDIOGRAM (N/A) Continues to progress well. Plan discharge home today to continue abx at HD and with [facility] services.   LOS: 22 days    Coolidge Breeze 11/13/2013  Plan d/c tomorrow I have seen and examined [name] and agree with the above assessment  and plan.  [name] MD Beeper [phone] Office [phone] 11/13/2013 8:44 AM

## 2013-11-13 NOTE — Progress Notes (Signed)
Pt just walked, is anxious to go home. Discussed/reviewed with pt and wife ex gl and CRPII. Voiced understanding. [phone] Yves Dill CES, ACSM 10:08 AM 11/13/2013

## 2013-11-13 NOTE — Progress Notes (Signed)
ANTIBIOTIC CONSULT NOTE - FOLLOW UP  Pharmacy Consult for Cefazolin / Gentamicin Indication: MSSA bacteremia/endocarditis  Allergies  Allergen Reactions  . Oxycodone     Gives patient nightmares  . Rifampin Nausea Only   Patient Measurements: Height: 6' 1.5" (186.7 cm) Weight: 183 lb 13.8 oz (83.4 kg) IBW/kg (Calculated) : 81.05  Vital Signs: Temp: 98.4 F (36.9 C) (04/16 0459) Temp src: Oral (04/16 0459) BP: 116/76 mmHg (04/16 0459) Pulse Rate: 91 (04/16 0459) Intake/Output from previous day: 04/15 0701 - 04/16 0700 In: 240 [P.O.:240] Out: 2538  Intake/Output from this shift:    Labs:  Recent Labs  11/10/13 1331 11/12/13 0743  WBC  --  10.2  HGB  --  8.1*  PLT  --  347  CREATININE 4.65* 4.63*   Estimated Creatinine Clearance: 20.4 ml/min (by C-G formula based on Cr of 4.63). No results found for this basename: VANCOTROUGH, Corlis Leak, VANCORANDOM, GENTTROUGH, GENTPEAK, GENTRANDOM, TOBRATROUGH, TOBRAPEAK, TOBRARND, AMIKACINPEAK, AMIKACINTROU, AMIKACIN,  in the last 72 hours  Microbiology: Recent Results (from the past 720 hour(s))  CLOSTRIDIUM DIFFICILE BY PCR     Status: None   Collection Time    10/22/13 10:04 PM      Result Value Ref Range Status   C difficile by pcr NEGATIVE  NEGATIVE Final  CULTURE, BLOOD (ROUTINE X 2)     Status: None   Collection Time    10/22/13 10:26 PM      Result Value Ref Range Status   Specimen Description BLOOD RIGHT ARM   Final   Special Requests BOTTLES DRAWN AEROBIC ONLY 3CC   Final   Culture  Setup Time     Final   Value: 10/23/2013 04:42     Performed at [facility]   Culture     Final   Value: NO GROWTH 5 DAYS     Performed at [facility]   Report Status 10/29/2013 FINAL   Final  CULTURE, BLOOD (ROUTINE X 2)     Status: None   Collection Time    10/22/13 10:34 PM      Result Value Ref Range Status   Specimen Description BLOOD RIGHT HAND   Final   Special Requests BOTTLES DRAWN AEROBIC ONLY [facility]    Final   Culture  Setup Time     Final   Value: 10/23/2013 04:42     Performed at [facility]   Culture     Final   Value: NO GROWTH 5 DAYS     Performed at [facility]   Report Status 10/29/2013 FINAL   Final  SURGICAL PCR SCREEN     Status: None   Collection Time    10/27/13  1:13 PM      Result Value Ref Range Status   MRSA, PCR NEGATIVE  NEGATIVE Final   Staphylococcus aureus NEGATIVE  NEGATIVE Final   Comment:            The Xpert SA Assay (FDA     approved for NASAL specimens     in patients over 10 years of age),     is one component of     a comprehensive surveillance     program.  Test performance has     been validated by [employer] for patients greater     than or equal to 70 year old.     It is not intended     to diagnose infection nor  to     guide or monitor treatment.  TISSUE CULTURE     Status: None   Collection Time    10/28/13  1:20 PM      Result Value Ref Range Status   Specimen Description TISSUE AORTA   Final   Special Requests PT ON VANCOMYCIN AND ZINACEF   Final   Gram Stain     Final   Value: RARE WBC PRESENT,BOTH PMN AND MONONUCLEAR     NO ORGANISMS SEEN     Performed at [facility]   Culture     Final   Value: NO GROWTH 3 DAYS     Performed at [facility]   Report Status 10/31/2013 FINAL   Final  GRAM STAIN     Status: None   Collection Time    10/28/13  2:26 PM      Result Value Ref Range Status   Specimen Description ABSCESS AORTA   Final   Special Requests PT ON VANCOMYCIN AND ZINACEF   Final   Gram Stain     Final   Value: RARE WBC PRESENT, PREDOMINANTLY PMN     NO ORGANISMS SEEN   Report Status 10/28/2013 FINAL   Final  ANAEROBIC CULTURE     Status: None   Collection Time    10/28/13  2:26 PM      Result Value Ref Range Status   Specimen Description ABSCESS AORTA   Final   Special Requests PT ON VANCOMYCIN AND ZINACEF   Final   Gram Stain     Final   Value: RARE WBC PRESENT,BOTH PMN AND  MONONUCLEAR     NO ORGANISMS SEEN     Performed at [facility]   Culture     Final   Value: NO ANAEROBES ISOLATED     Performed at [facility]   Report Status 11/02/2013 FINAL   Final  CULTURE, ROUTINE-ABSCESS     Status: None   Collection Time    10/28/13  2:26 PM      Result Value Ref Range Status   Specimen Description ABSCESS AORTA   Final   Special Requests PT ON VANCOMYCIN AND ZINACEF   Final   Gram Stain     Final   Value: RARE WBC PRESENT, PREDOMINANTLY PMN     NO SQUAMOUS EPITHELIAL CELLS SEEN     NO ORGANISMS SEEN     Performed at [facility]     Performed at [facility]   Culture     Final   Value: NO GROWTH 3 DAYS     Performed at [facility]   Report Status 10/31/2013 FINAL   Final  CLOSTRIDIUM DIFFICILE BY PCR     Status: None   Collection Time    11/02/13 10:04 AM      Result Value Ref Range Status   C difficile by pcr NEGATIVE  NEGATIVE Final  URINE CULTURE     Status: None   Collection Time    11/04/13  9:45 AM      Result Value Ref Range Status   Specimen Description URINE, CLEAN CATCH   Final   Special Requests Nafcillin, Rifampin, Gent Normal   Final   Culture  Setup Time     Final   Value: 11/04/2013 10:58     Performed at [place]     Final   Value: NO GROWTH     Performed  at [facility]   Culture     Final   Value: NO GROWTH     Performed at [facility]   Report Status 11/05/2013 FINAL   Final    Anti-infectives   Start     Dose/Rate Route Frequency Ordered Stop   11/14/13 1800  gentamicin (GARAMYCIN) IVPB 100 mg  Status:  Discontinued     100 mg 200 mL/hr over 30 Minutes Intravenous Every M-W-F (1800) 11/13/13 0820 11/13/13 0821   11/14/13 1200  gentamicin (GARAMYCIN) IVPB 100 mg     100 mg 200 mL/hr over 30 Minutes Intravenous  Once 11/13/13 0920     11/13/13 0000  ceFAZolin (ANCEF) 2-3 GM-% SOLR     2 g 100 mL/hr over 30 Minutes Intravenous Every  M-W-F (Hemodialysis) 11/13/13 0820     11/13/13 0000  rifampin (RIFADIN) 300 MG capsule     300 mg Oral 3 times daily 11/13/13 0820     11/13/13 0000  gentamicin (GARAMYCIN) 1-0.9 MG/ML-%     over 30 Minutes   11/13/13 0821     11/12/13 1200  ceFAZolin (ANCEF) IVPB 2 g/50 mL premix     2 g 100 mL/hr over 30 Minutes Intravenous Every M-W-F (Hemodialysis) 11/11/13 0854     11/11/13 1800  ceFAZolin (ANCEF) IVPB 2 g/50 mL premix  Status:  Discontinued     2 g 100 mL/hr over 30 Minutes Intravenous Every T-Th-Sa (1800) 11/10/13 1855 11/11/13 0854   11/10/13 2200  rifampin (RIFADIN) capsule 300 mg     300 mg Oral 3 times daily 11/10/13 1813     11/10/13 2000  ceFAZolin (ANCEF) IVPB 2 g/50 mL premix     2 g 100 mL/hr over 30 Minutes Intravenous  Once 11/10/13 1855 11/10/13 2337   11/08/13 1530  gentamicin (GARAMYCIN) IVPB 100 mg     100 mg 200 mL/hr over 30 Minutes Intravenous  Once 11/08/13 1527 11/08/13 1804   11/06/13 1400  nafcillin 2 g in dextrose 5 % 50 mL IVPB  Status:  Discontinued     2 g 100 mL/hr over 30 Minutes Intravenous 6 times per day 11/06/13 1347 11/10/13 1812   11/05/13 1800  gentamicin (GARAMYCIN) IVPB 100 mg     100 mg 200 mL/hr over 30 Minutes Intravenous Every M-W-F (1800) 11/04/13 1317 11/12/13 1930   11/04/13 1300  gentamicin (GARAMYCIN) IVPB 100 mg     100 mg 200 mL/hr over 30 Minutes Intravenous  Once 11/04/13 1146 11/04/13 1320   11/03/13 1200  gentamicin (GARAMYCIN) IVPB 100 mg  Status:  Discontinued     100 mg 200 mL/hr over 30 Minutes Intravenous Every M-W-F (Hemodialysis) 11/01/13 1429 11/04/13 1317   11/02/13 2200  rifampin (RIFADIN) capsule 300 mg  Status:  Discontinued     300 mg Oral 3 times per day 11/02/13 1426 11/10/13 1812   11/01/13 1200  gentamicin (GARAMYCIN) IVPB 100 mg  Status:  Discontinued     100 mg 200 mL/hr over 30 Minutes Intravenous Every T-Th-Sa (Hemodialysis) 10/30/13 1037 11/01/13 1429   10/31/13 1200  gentamicin (GARAMYCIN) IVPB 80  mg  Status:  Discontinued     80 mg 100 mL/hr over 30 Minutes Intravenous Every Dialysis 10/30/13 0047 10/30/13 1027   10/30/13 1300  gentamicin (GARAMYCIN) 120 mg in dextrose 5 % 50 mL IVPB     120 mg 106 mL/hr over 30 Minutes Intravenous  Once 10/30/13 1037 10/30/13 1615   10/30/13 0500  gentamicin (  GARAMYCIN) IVPB 80 mg     80 mg 100 mL/hr over 30 Minutes Intravenous  Once 10/30/13 0047 10/30/13 0526   10/29/13 1600  levofloxacin (LEVAQUIN) IVPB 500 mg  Status:  Discontinued     500 mg 100 mL/hr over 60 Minutes Intravenous Every 48 hours 10/27/13 1457 10/29/13 0857   10/29/13 1000  rifampin (RIFADIN) 300 mg in sodium chloride 0.9 % 100 mL IVPB     300 mg 200 mL/hr over 30 Minutes Intravenous 3 times per day 10/29/13 0858 11/02/13 1520   10/29/13 1000  nafcillin 2 g in dextrose 5 % 50 mL IVPB  Status:  Discontinued     2 g 100 mL/hr over 30 Minutes Intravenous 6 times per day 10/29/13 0901 11/06/13 1347   10/29/13 0900  nafcillin injection 2 g  Status:  Discontinued     2 g Intravenous Every 4 hours 10/29/13 0857 10/29/13 0900   10/29/13 0500  vancomycin (VANCOCIN) IVPB 1000 mg/200 mL premix     1,000 mg 200 mL/hr over 60 Minutes Intravenous  Once 10/28/13 2146 10/29/13 0600   10/29/13 0100  cefUROXime (ZINACEF) 1.5 g in dextrose 5 % 50 mL IVPB  Status:  Discontinued     1.5 g 100 mL/hr over 30 Minutes Intravenous Every 12 hours 10/28/13 2146 10/29/13 0857   10/28/13 0400  vancomycin (VANCOCIN) 1,250 mg in sodium chloride 0.9 % 250 mL IVPB     1,250 mg 166.7 mL/hr over 90 Minutes Intravenous To Surgery 10/27/13 1450 10/28/13 0905   10/28/13 0400  cefUROXime (ZINACEF) 1.5 g in dextrose 5 % 50 mL IVPB     1.5 g 100 mL/hr over 30 Minutes Intravenous To Surgery 10/27/13 1450 10/28/13 2030   10/28/13 0400  cefUROXime (ZINACEF) 750 mg in dextrose 5 % 50 mL IVPB  Status:  Discontinued     750 mg 100 mL/hr over 30 Minutes Intravenous To Surgery 10/27/13 1447 10/28/13 2114   10/27/13 1600   levofloxacin (LEVAQUIN) IVPB 750 mg     750 mg 100 mL/hr over 90 Minutes Intravenous  Once 10/27/13 1456 10/28/13 0231   10/23/13 0800  ceFAZolin (ANCEF) IVPB 1 g/50 mL premix  Status:  Discontinued     1 g 100 mL/hr over 30 Minutes Intravenous  Once 10/22/13 2313 10/22/13 2326   10/22/13 2359  ceFAZolin (ANCEF) IVPB 2 g/50 mL premix  Status:  Discontinued     2 g 100 mL/hr over 30 Minutes Intravenous Every M-W-F (Hemodialysis) 10/22/13 2314 10/28/13 2146   10/22/13 2315  ceFAZolin (ANCEF) IVPB 2 g/50 mL premix  Status:  Discontinued     2 g 100 mL/hr over 30 Minutes Intravenous  Once 10/22/13 2313 10/22/13 2326     Assessment: 58 y.o. M with ESRD who is switching from Nafcillin to Cefazolin for continued treatment of MSSA prosthetic valve IE. The patient is noted to have received HD off-schedule earlier today with plans to get back on regular schedule of T/Th/Sat starting on Saturday 4/18.   The patient continues on Gentamicin for synergy until 4/17 (completion of 2 weeks).  Goal of Therapy:  Proper antibiotics for infection/cultures adjusted for renal/hepatic function   Plan:  1. Continue Ancef 2 Grams iv Q HD (currently MWF, TTS normally) 2. Gentamicin 100 mg iv Q HD (to complete 4/17) 3. Continue to follow  If discharged today, will need one additional dose of gentamicin on Saturday with HD to complete course per ID note  Thank you. Anette Guarneri,  PharmD [phone] 11/13/2013 9:23 AM

## 2013-11-13 NOTE — Progress Notes (Signed)
Physical Therapy Treatment Patient Details Name: Bradley [name] MRN: [id number] DOB: [birth year] Today's Date: 11/13/2013    History of Present Illness Pt admit with sepsis and r/o endocarditis.  Anoxic brain injury.  Underwent redo of sternotomy and Bentall procedure 10/28/13 due to aortic root abscess    PT Comments    Pt admitted with above. Pt currently with functional limitations due to balance and endurance deficits.  Has progressed well over last few visits and plan is to go home with family.   Pt will benefit from skilled PT to increase their independence and safety with mobility to allow discharge to the venue listed below.    Follow Up Recommendations  Supervision/Assistance - 24 hour;Home health PT     Equipment Recommendations  Rolling walker with 5" wheels;3in1 (PT)    Recommendations for Other Services       Precautions / Restrictions Precautions Precautions: Fall;Sternal Precaution Comments: Pt needs reminders for sternal precautions. He is able to state them. Restrictions Weight Bearing Restrictions: No Other Position/Activity Restrictions: sternal precautions    Mobility  Bed Mobility Overal bed mobility: Needs Assistance Bed Mobility: Supine to Sit Rolling: Supervision         General bed mobility comments: Pt able to sit up with supervision today.  Transfers Overall transfer level: Needs assistance Equipment used: Rolling walker (2 wheeled) Transfers: Sit to/from Stand Sit to Stand: Supervision Stand pivot transfers: Min guard       General transfer comment: Did not need cues for precautions.  Needed cues for stand pivot for safety.  Pt did use 3N1 prior to walk as he needed to have BM.    Ambulation/Gait Ambulation/Gait assistance: Min guard Ambulation Distance (Feet): 400 Feet Assistive device: Rolling walker (2 wheeled) Gait Pattern/deviations: Step-through pattern;Decreased stride length Gait velocity: requires cues to incr velocity Gait  velocity interpretation: Below normal speed for age/gender General Gait Details: Ambulating well with RW today.  No cues needed.     Stairs            Wheelchair Mobility    Modified Rankin (Stroke Patients Only)       Balance Overall balance assessment: Needs assistance;History of Falls Sitting-balance support: No upper extremity supported;Feet supported Sitting balance-Leahy Scale: Good     Standing balance support: Bilateral upper extremity supported;During functional activity Standing balance-Leahy Scale: Fair Standing balance comment: during pericare, pt not totally reliant on RW.                      Cognition Arousal/Alertness: Awake/alert Behavior During Therapy: WFL for tasks assessed/performed Overall Cognitive Status: Impaired/Different from baseline Area of Impairment: Memory;Attention;Safety/judgement Orientation Level: Time;Situation Current Attention Level: Sustained Memory: Decreased short-term memory;Decreased recall of precautions Following Commands: Follows multi-step commands with increased time Safety/Judgement: Decreased awareness of safety;Decreased awareness of deficits Awareness: Emergent Problem Solving: Requires verbal cues General Comments: Safety has improved but pt still needs cues at times for safety    Exercises General Exercises - Lower Extremity Long Arc Quad: AROM;Both;5 reps;Seated Hip Flexion/Marching: AROM;Both;5 reps;Standing    General Comments        Pertinent Vitals/Pain VSS, No pain    Home Living                      Prior Function            PT Goals (current goals can now be found in the care plan section) Progress towards PT goals: Progressing toward goals  Frequency  Min 3X/week    PT Plan Discharge plan needs to be updated    Co-evaluation             End of Session Equipment Utilized During Treatment: Gait belt Activity Tolerance: Patient tolerated treatment well Patient  left: in chair;with call bell/phone within reach     Time: 0926-0937 PT Time Calculation (min): 11 min  Charges:  $Gait Training: 8-22 mins                    G Codes:      Bradley [name] Bradley [name] Dec 01, 2013, 11:06 AM  Bradley [name] Acute Rehabilitation [phone] (pager)

## 2013-11-13 NOTE — Progress Notes (Signed)
       [address]       [place] [address]             [phone]          16 Days Post-Op Procedure(s) (LRB): REDO STERNOTOMY (N/A) REDO BENTALL PROCEDURE, debridment of aoritc root abscess, replacement of aortic root, ascending aorta and aortic valve with homograft. Insertion of left femoral arterial line (N/A) INTRAOPERATIVE TRANSESOPHAGEAL ECHOCARDIOGRAM (N/A)  Subjective: Feels better today, no complaints.  Walked after HD yesterday, no dyspnea or CP.   Objective: Vital signs in last 24 hours: Patient Vitals for the past 24 hrs:  BP Temp Temp src Pulse Resp SpO2 Weight  11/13/13 0503 - - - - - - 183 lb 13.8 oz (83.4 kg)  11/13/13 0459 116/76 mmHg 98.4 F (36.9 C) Oral 91 18 96 % -  11/12/13 2118 118/79 mmHg 99.3 F (37.4 C) Oral 90 18 98 % -  11/12/13 1709 116/77 mmHg - - 97 - - -  11/12/13 1409 111/79 mmHg 98.1 F (36.7 C) Oral 94 18 99 % -  11/12/13 1154 111/67 mmHg - - 93 - - -  11/12/13 1129 102/76 mmHg 98.4 F (36.9 C) Oral 96 20 99 % 183 lb 13.8 oz (83.4 kg)  11/12/13 1100 106/76 mmHg - - 96 - - -  11/12/13 1030 100/74 mmHg - - 96 - - -  11/12/13 1000 100/73 mmHg - - 98 - - -  11/12/13 0930 93/75 mmHg - - 104 - - -   Current Weight  11/13/13 183 lb 13.8 oz (83.4 kg)     Intake/Output from previous day: 04/15 0701 - 04/16 0700 In: 240 [P.O.:240] Out: 2538     PHYSICAL EXAM:  Heart: RRR Lungs: Slightly diminished BS in bases, overall clear Wound: Clean and dry Extremities: No LE edema    Lab Results: CBC:  Recent Labs  11/12/13 0743  WBC 10.2  HGB 8.1*  HCT 24.3*  PLT 347   BMET:   Recent Labs  11/10/13 1331 11/12/13 0743  NA 139 138  K 4.3 3.9  CL 97 96  CO2 26 26  GLUCOSE 114* 84  BUN 15 15  CREATININE 4.65* 4.63*  CALCIUM 8.3* 8.8    PT/INR: No results found for this basename: LABPROT, INR,  in the last 72 hours    Assessment/Plan: S/P Procedure(s) (LRB): REDO STERNOTOMY (N/A) REDO BENTALL  PROCEDURE, debridment of aoritc root abscess, replacement of aortic root, ascending aorta and aortic valve with homograft. Insertion of left femoral arterial line (N/A) INTRAOPERATIVE TRANSESOPHAGEAL ECHOCARDIOGRAM (N/A) Continues to progress well. Plan discharge home today to continue abx at HD and with [facility] services.   LOS: 22 days    Bradley [name] 11/13/2013  Plan d/c today I have seen and examined Bradley [name] and agree with the above assessment  and plan.  Bradley Isaac MD Beeper [phone] Office [phone] 11/13/2013 9:26 AM

## 2013-11-13 NOTE — Telephone Encounter (Addendum)
Message copied by Gena Fray on Thu Nov 13, 2013  2:24 PM ------      Message from: Elam Dutch      Created: Tue Nov 11, 2013  4:14 PM       Pt being dcd today      Had left arm AVF by Kellie Simmering a few weeks ago      Has left forearm swelling.            He needs appt with DR Kellie Simmering            In 2-3 weeks             Juanda Crumble ------  11/13/13: spoke with pts wife, dpm

## 2013-11-17 ENCOUNTER — Telehealth: Payer: Self-pay | Admitting: *Deleted

## 2013-11-17 DIAGNOSIS — R112 Nausea with vomiting, unspecified: Secondary | ICD-10-CM

## 2013-11-17 DIAGNOSIS — Z9889 Other specified postprocedural states: Principal | ICD-10-CM

## 2013-11-17 MED ORDER — ONDANSETRON 4 MG PO TBDP: 4.0000 mg | ORAL_TABLET | Freq: Three times a day (TID) | ORAL | Status: DC | PRN

## 2013-11-17 NOTE — Telephone Encounter (Signed)
Bradley [name] called and was discharged on 11/13/13 and is having nausea as he was having in the hospital.  He is requesting medication for this this.  Zofran was prescribed in the hospital and will be e-prescribed today.

## 2013-11-23 ENCOUNTER — Encounter (HOSPITAL_COMMUNITY): Payer: Self-pay | Admitting: Emergency Medicine

## 2013-11-23 ENCOUNTER — Inpatient Hospital Stay (HOSPITAL_COMMUNITY)
Admission: EM | Admit: 2013-11-23 | Discharge: 2013-11-28 | DRG: 264 | Disposition: A | Discharge status: Home Health | Payer: Medicaid Other | Attending: Family Medicine | Admitting: Family Medicine

## 2013-11-23 ENCOUNTER — Emergency Department (HOSPITAL_COMMUNITY): Payer: Medicaid Other

## 2013-11-23 ENCOUNTER — Other Ambulatory Visit: Payer: Self-pay

## 2013-11-23 DIAGNOSIS — Y831 Surgical operation with implant of artificial internal device as the cause of abnormal reaction of the patient, or of later complication, without mention of misadventure at the time of the procedure: Secondary | ICD-10-CM | POA: Diagnosis present

## 2013-11-23 DIAGNOSIS — R079 Chest pain, unspecified: Secondary | ICD-10-CM

## 2013-11-23 DIAGNOSIS — E43 Unspecified severe protein-calorie malnutrition: Secondary | ICD-10-CM

## 2013-11-23 DIAGNOSIS — R1310 Dysphagia, unspecified: Secondary | ICD-10-CM | POA: Diagnosis present

## 2013-11-23 DIAGNOSIS — T826XXA Infection and inflammatory reaction due to cardiac valve prosthesis, initial encounter: Secondary | ICD-10-CM

## 2013-11-23 DIAGNOSIS — I213 ST elevation (STEMI) myocardial infarction of unspecified site: Secondary | ICD-10-CM

## 2013-11-23 DIAGNOSIS — I2489 Other forms of acute ischemic heart disease: Secondary | ICD-10-CM | POA: Diagnosis present

## 2013-11-23 DIAGNOSIS — E873 Alkalosis: Secondary | ICD-10-CM

## 2013-11-23 DIAGNOSIS — R45851 Suicidal ideations: Secondary | ICD-10-CM

## 2013-11-23 DIAGNOSIS — R109 Unspecified abdominal pain: Secondary | ICD-10-CM

## 2013-11-23 DIAGNOSIS — I447 Left bundle-branch block, unspecified: Secondary | ICD-10-CM

## 2013-11-23 DIAGNOSIS — B9561 Methicillin susceptible Staphylococcus aureus infection as the cause of diseases classified elsewhere: Secondary | ICD-10-CM

## 2013-11-23 DIAGNOSIS — I38 Endocarditis, valve unspecified: Secondary | ICD-10-CM

## 2013-11-23 DIAGNOSIS — E039 Hypothyroidism, unspecified: Secondary | ICD-10-CM

## 2013-11-23 DIAGNOSIS — Z952 Presence of prosthetic heart valve: Secondary | ICD-10-CM

## 2013-11-23 DIAGNOSIS — E890 Postprocedural hypothyroidism: Secondary | ICD-10-CM

## 2013-11-23 DIAGNOSIS — A419 Sepsis, unspecified organism: Secondary | ICD-10-CM

## 2013-11-23 DIAGNOSIS — N186 End stage renal disease: Secondary | ICD-10-CM

## 2013-11-23 DIAGNOSIS — D6959 Other secondary thrombocytopenia: Secondary | ICD-10-CM

## 2013-11-23 DIAGNOSIS — J111 Influenza due to unidentified influenza virus with other respiratory manifestations: Secondary | ICD-10-CM

## 2013-11-23 DIAGNOSIS — IMO0002 Reserved for concepts with insufficient information to code with codable children: Secondary | ICD-10-CM

## 2013-11-23 DIAGNOSIS — I248 Other forms of acute ischemic heart disease: Secondary | ICD-10-CM | POA: Diagnosis present

## 2013-11-23 DIAGNOSIS — N184 Chronic kidney disease, stage 4 (severe): Secondary | ICD-10-CM | POA: Diagnosis present

## 2013-11-23 DIAGNOSIS — R000 Tachycardia, unspecified: Secondary | ICD-10-CM

## 2013-11-23 DIAGNOSIS — I359 Nonrheumatic aortic valve disorder, unspecified: Secondary | ICD-10-CM | POA: Diagnosis present

## 2013-11-23 DIAGNOSIS — D631 Anemia in chronic kidney disease: Secondary | ICD-10-CM | POA: Diagnosis present

## 2013-11-23 DIAGNOSIS — I214 Non-ST elevation (NSTEMI) myocardial infarction: Secondary | ICD-10-CM

## 2013-11-23 DIAGNOSIS — I5031 Acute diastolic (congestive) heart failure: Secondary | ICD-10-CM

## 2013-11-23 DIAGNOSIS — R4182 Altered mental status, unspecified: Secondary | ICD-10-CM

## 2013-11-23 DIAGNOSIS — Z7982 Long term (current) use of aspirin: Secondary | ICD-10-CM

## 2013-11-23 DIAGNOSIS — I5042 Chronic combined systolic (congestive) and diastolic (congestive) heart failure: Secondary | ICD-10-CM

## 2013-11-23 DIAGNOSIS — I10 Essential (primary) hypertension: Secondary | ICD-10-CM

## 2013-11-23 DIAGNOSIS — IMO0001 Reserved for inherently not codable concepts without codable children: Secondary | ICD-10-CM

## 2013-11-23 DIAGNOSIS — Z808 Family history of malignant neoplasm of other organs or systems: Secondary | ICD-10-CM

## 2013-11-23 DIAGNOSIS — G9340 Encephalopathy, unspecified: Secondary | ICD-10-CM

## 2013-11-23 DIAGNOSIS — N179 Acute kidney failure, unspecified: Secondary | ICD-10-CM

## 2013-11-23 DIAGNOSIS — F329 Major depressive disorder, single episode, unspecified: Secondary | ICD-10-CM

## 2013-11-23 DIAGNOSIS — M75101 Unspecified rotator cuff tear or rupture of right shoulder, not specified as traumatic: Secondary | ICD-10-CM

## 2013-11-23 DIAGNOSIS — I330 Acute and subacute infective endocarditis: Secondary | ICD-10-CM | POA: Diagnosis present

## 2013-11-23 DIAGNOSIS — J189 Pneumonia, unspecified organism: Secondary | ICD-10-CM

## 2013-11-23 DIAGNOSIS — R451 Restlessness and agitation: Secondary | ICD-10-CM

## 2013-11-23 DIAGNOSIS — F411 Generalized anxiety disorder: Secondary | ICD-10-CM

## 2013-11-23 DIAGNOSIS — I339 Acute and subacute endocarditis, unspecified: Secondary | ICD-10-CM | POA: Diagnosis present

## 2013-11-23 DIAGNOSIS — F3289 Other specified depressive episodes: Secondary | ICD-10-CM | POA: Diagnosis present

## 2013-11-23 DIAGNOSIS — R7989 Other specified abnormal findings of blood chemistry: Secondary | ICD-10-CM

## 2013-11-23 DIAGNOSIS — D693 Immune thrombocytopenic purpura: Secondary | ICD-10-CM

## 2013-11-23 DIAGNOSIS — Z888 Allergy status to other drugs, medicaments and biological substances status: Secondary | ICD-10-CM

## 2013-11-23 DIAGNOSIS — R110 Nausea: Secondary | ICD-10-CM

## 2013-11-23 DIAGNOSIS — N2581 Secondary hyperparathyroidism of renal origin: Secondary | ICD-10-CM | POA: Diagnosis present

## 2013-11-23 DIAGNOSIS — A4101 Sepsis due to Methicillin susceptible Staphylococcus aureus: Secondary | ICD-10-CM

## 2013-11-23 DIAGNOSIS — N039 Chronic nephritic syndrome with unspecified morphologic changes: Secondary | ICD-10-CM

## 2013-11-23 DIAGNOSIS — Z0389 Encounter for observation for other suspected diseases and conditions ruled out: Secondary | ICD-10-CM

## 2013-11-23 DIAGNOSIS — R778 Other specified abnormalities of plasma proteins: Secondary | ICD-10-CM | POA: Diagnosis present

## 2013-11-23 DIAGNOSIS — D649 Anemia, unspecified: Secondary | ICD-10-CM | POA: Diagnosis present

## 2013-11-23 DIAGNOSIS — J9819 Other pulmonary collapse: Secondary | ICD-10-CM | POA: Diagnosis present

## 2013-11-23 DIAGNOSIS — F333 Major depressive disorder, recurrent, severe with psychotic symptoms: Secondary | ICD-10-CM

## 2013-11-23 DIAGNOSIS — Z8249 Family history of ischemic heart disease and other diseases of the circulatory system: Secondary | ICD-10-CM

## 2013-11-23 DIAGNOSIS — R7881 Bacteremia: Secondary | ICD-10-CM

## 2013-11-23 DIAGNOSIS — Z885 Allergy status to narcotic agent status: Secondary | ICD-10-CM

## 2013-11-23 DIAGNOSIS — R634 Abnormal weight loss: Secondary | ICD-10-CM

## 2013-11-23 DIAGNOSIS — Z992 Dependence on renal dialysis: Secondary | ICD-10-CM | POA: Diagnosis present

## 2013-11-23 DIAGNOSIS — B370 Candidal stomatitis: Secondary | ICD-10-CM

## 2013-11-23 DIAGNOSIS — I509 Heart failure, unspecified: Secondary | ICD-10-CM

## 2013-11-23 DIAGNOSIS — E876 Hypokalemia: Secondary | ICD-10-CM | POA: Diagnosis not present

## 2013-11-23 DIAGNOSIS — I76 Septic arterial embolism: Secondary | ICD-10-CM

## 2013-11-23 DIAGNOSIS — I129 Hypertensive chronic kidney disease with stage 1 through stage 4 chronic kidney disease, or unspecified chronic kidney disease: Secondary | ICD-10-CM | POA: Diagnosis present

## 2013-11-23 DIAGNOSIS — R112 Nausea with vomiting, unspecified: Secondary | ICD-10-CM

## 2013-11-23 DIAGNOSIS — A4901 Methicillin susceptible Staphylococcus aureus infection, unspecified site: Secondary | ICD-10-CM | POA: Diagnosis present

## 2013-11-23 DIAGNOSIS — T827XXA Infection and inflammatory reaction due to other cardiac and vascular devices, implants and grafts, initial encounter: Principal | ICD-10-CM | POA: Diagnosis present

## 2013-11-23 DIAGNOSIS — F32A Depression, unspecified: Secondary | ICD-10-CM

## 2013-11-23 DIAGNOSIS — G0490 Encephalitis and encephalomyelitis, unspecified: Secondary | ICD-10-CM

## 2013-11-23 LAB — CBC WITH DIFFERENTIAL/PLATELET
Basophils Absolute: 0.1 10*3/uL (ref 0.0–0.1)
Basophils Relative: 2 % — ABNORMAL HIGH (ref 0–1)
Eosinophils Absolute: 0.3 10*3/uL (ref 0.0–0.7)
Eosinophils Relative: 5 % (ref 0–5)
HCT: 34.1 % — ABNORMAL LOW (ref 39.0–52.0)
Hemoglobin: 10.5 g/dL — ABNORMAL LOW (ref 13.0–17.0)
Lymphocytes Relative: 11 % — ABNORMAL LOW (ref 12–46)
Lymphs Abs: 0.7 10*3/uL (ref 0.7–4.0)
MCH: 30.3 pg (ref 26.0–34.0)
MCHC: 30.8 g/dL (ref 30.0–36.0)
MCV: 98.6 fL (ref 78.0–100.0)
Monocytes Absolute: 1 10*3/uL (ref 0.1–1.0)
Monocytes Relative: 17 % — ABNORMAL HIGH (ref 3–12)
Neutro Abs: 3.9 10*3/uL (ref 1.7–7.7)
Neutrophils Relative %: 65 % (ref 43–77)
Platelets: 318 10*3/uL (ref 150–400)
RBC: 3.46 MIL/uL — ABNORMAL LOW (ref 4.22–5.81)
RDW: 23.9 % — ABNORMAL HIGH (ref 11.5–15.5)
WBC: 6 10*3/uL (ref 4.0–10.5)

## 2013-11-23 LAB — URINE MICROSCOPIC-ADD ON

## 2013-11-23 LAB — COMPREHENSIVE METABOLIC PANEL
ALT: 9 U/L (ref 0–53)
AST: 23 U/L (ref 0–37)
Albumin: 2.2 g/dL — ABNORMAL LOW (ref 3.5–5.2)
Alkaline Phosphatase: 88 U/L (ref 39–117)
BUN: 6 mg/dL (ref 6–23)
CO2: 25 mEq/L (ref 19–32)
Calcium: 9 mg/dL (ref 8.4–10.5)
Chloride: 96 mEq/L (ref 96–112)
Creatinine, Ser: 2.04 mg/dL — ABNORMAL HIGH (ref 0.50–1.35)
GFR calc Af Amer: 40 mL/min — ABNORMAL LOW (ref >90)
GFR calc non Af Amer: 35 mL/min — ABNORMAL LOW (ref >90)
Glucose, Bld: 96 mg/dL (ref 70–99)
Potassium: 3.6 mEq/L — ABNORMAL LOW (ref 3.7–5.3)
Sodium: 137 mEq/L (ref 137–147)
Total Bilirubin: 0.4 mg/dL (ref 0.3–1.2)
Total Protein: 7.6 g/dL (ref 6.0–8.3)

## 2013-11-23 LAB — URINALYSIS, ROUTINE W REFLEX MICROSCOPIC
Bilirubin Urine: NEGATIVE
Glucose, UA: NEGATIVE mg/dL
Ketones, ur: NEGATIVE mg/dL
Leukocytes, UA: NEGATIVE
Nitrite: NEGATIVE
Protein, ur: 100 mg/dL — AB
Specific Gravity, Urine: 1.018 (ref 1.005–1.030)
Urobilinogen, UA: 0.2 mg/dL (ref 0.0–1.0)
pH: 8 (ref 5.0–8.0)

## 2013-11-23 LAB — POC OCCULT BLOOD, ED: Fecal Occult Bld: NEGATIVE

## 2013-11-23 LAB — I-STAT CG4 LACTIC ACID, ED: Lactic Acid, Venous: 1.73 mmol/L (ref 0.5–2.2)

## 2013-11-23 LAB — TROPONIN I
Troponin I: 0.67 ng/mL (ref <0.30)
Troponin I: 0.65 ng/mL (ref <0.30)

## 2013-11-23 LAB — AMMONIA: Ammonia: 20 umol/L (ref 11–60)

## 2013-11-23 MED ORDER — ONDANSETRON HCL 4 MG PO TABS
4.0000 mg | ORAL_TABLET | Freq: Four times a day (QID) | ORAL | Status: DC | PRN
  Administered 2013-11-28: 4 mg via ORAL
  Filled 2013-11-23: qty 1

## 2013-11-23 MED ORDER — LEVOTHYROXINE SODIUM 200 MCG PO TABS
200.0000 ug | ORAL_TABLET | Freq: Every day | ORAL | Status: DC
  Administered 2013-11-24 – 2013-11-28 (×5): 200 ug via ORAL
  Filled 2013-11-23 (×7): qty 1

## 2013-11-23 MED ORDER — SEVELAMER CARBONATE 800 MG PO TABS
800.0000 mg | ORAL_TABLET | Freq: Three times a day (TID) | ORAL | Status: DC
  Filled 2013-11-23 (×5): qty 2

## 2013-11-23 MED ORDER — PANTOPRAZOLE SODIUM 40 MG PO TBEC
40.0000 mg | DELAYED_RELEASE_TABLET | Freq: Two times a day (BID) | ORAL | Status: DC
  Administered 2013-11-23 – 2013-11-28 (×11): 40 mg via ORAL
  Filled 2013-11-23 (×12): qty 1

## 2013-11-23 MED ORDER — SODIUM CHLORIDE 0.9 % IV SOLN
1000.0000 mL | Freq: Once | INTRAVENOUS | Status: AC
  Administered 2013-11-23: 1000 mL via INTRAVENOUS

## 2013-11-23 MED ORDER — SODIUM CHLORIDE 0.9 % IV SOLN: 1000.0000 mL | INTRAVENOUS | Status: DC

## 2013-11-23 MED ORDER — AMIODARONE HCL 200 MG PO TABS
200.0000 mg | ORAL_TABLET | Freq: Every day | ORAL | Status: DC
  Administered 2013-11-23 – 2013-11-28 (×6): 200 mg via ORAL
  Filled 2013-11-23 (×6): qty 1

## 2013-11-23 MED ORDER — IOHEXOL 300 MG/ML  SOLN
50.0000 mL | Freq: Once | INTRAMUSCULAR | Status: AC | PRN
  Administered 2013-11-23: 50 mL via ORAL

## 2013-11-23 MED ORDER — NEPRO/CARBSTEADY PO LIQD
237.0000 mL | Freq: Two times a day (BID) | ORAL | Status: DC
  Administered 2013-11-24 – 2013-11-28 (×8): 237 mL via ORAL

## 2013-11-23 MED ORDER — SERTRALINE HCL 50 MG PO TABS
50.0000 mg | ORAL_TABLET | Freq: Every day | ORAL | Status: DC
  Administered 2013-11-23 – 2013-11-28 (×6): 50 mg via ORAL
  Filled 2013-11-23 (×6): qty 1

## 2013-11-23 MED ORDER — FE FUMARATE-B12-VIT C-FA-IFC PO CAPS
1.0000 | ORAL_CAPSULE | Freq: Every day | ORAL | Status: DC
  Administered 2013-11-23 – 2013-11-24 (×2): 1 via ORAL
  Filled 2013-11-23 (×2): qty 1

## 2013-11-23 MED ORDER — MIRTAZAPINE 15 MG PO TBDP
15.0000 mg | ORAL_TABLET | Freq: Every day | ORAL | Status: DC
Start: 2013-11-23 — End: 2013-11-28
  Administered 2013-11-23 – 2013-11-27 (×5): 15 mg via ORAL
  Filled 2013-11-23 (×6): qty 1

## 2013-11-23 MED ORDER — MIDODRINE HCL 5 MG PO TABS
5.0000 mg | ORAL_TABLET | Freq: Two times a day (BID) | ORAL | Status: DC
  Administered 2013-11-23 – 2013-11-25 (×4): 5 mg via ORAL
  Administered 2013-11-25: 17:00:00 via ORAL
  Administered 2013-11-26 – 2013-11-28 (×7): 5 mg via ORAL
  Filled 2013-11-23 (×13): qty 1

## 2013-11-23 MED ORDER — ASPIRIN EC 81 MG PO TBEC
81.0000 mg | DELAYED_RELEASE_TABLET | Freq: Every day | ORAL | Status: DC
  Administered 2013-11-23 – 2013-11-28 (×6): 81 mg via ORAL
  Filled 2013-11-23 (×6): qty 1

## 2013-11-23 MED ORDER — ONDANSETRON HCL 4 MG/2ML IJ SOLN: 4.0000 mg | Freq: Four times a day (QID) | INTRAMUSCULAR | Status: DC | PRN

## 2013-11-23 MED ORDER — CEFAZOLIN SODIUM-DEXTROSE 2-3 GM-% IV SOLR
2.0000 g | INTRAVENOUS | Status: DC
  Filled 2013-11-23: qty 50

## 2013-11-23 MED ORDER — ACETAMINOPHEN 325 MG PO TABS
650.0000 mg | ORAL_TABLET | Freq: Four times a day (QID) | ORAL | Status: DC | PRN
  Administered 2013-11-24: 650 mg via ORAL
  Filled 2013-11-23: qty 2

## 2013-11-23 MED ORDER — CLONAZEPAM 1 MG PO TABS
1.0000 mg | ORAL_TABLET | Freq: Two times a day (BID) | ORAL | Status: DC | PRN
  Administered 2013-11-23 – 2013-11-27 (×3): 1 mg via ORAL
  Filled 2013-11-23 (×3): qty 1

## 2013-11-23 MED ORDER — SIMETHICONE 80 MG PO CHEW
80.0000 mg | CHEWABLE_TABLET | Freq: Four times a day (QID) | ORAL | Status: DC
  Administered 2013-11-23 – 2013-11-28 (×17): 80 mg via ORAL
  Filled 2013-11-23 (×24): qty 1

## 2013-11-23 MED ORDER — HEPARIN SODIUM (PORCINE) 5000 UNIT/ML IJ SOLN
5000.0000 [IU] | Freq: Three times a day (TID) | INTRAMUSCULAR | Status: DC
  Administered 2013-11-23 – 2013-11-28 (×14): 5000 [IU] via SUBCUTANEOUS
  Filled 2013-11-23 (×18): qty 1

## 2013-11-23 MED ORDER — SODIUM CHLORIDE 0.9 % IV SOLN
Freq: Once | INTRAVENOUS | Status: AC
  Administered 2013-11-23: 14:00:00 via INTRAVENOUS

## 2013-11-23 MED ORDER — SODIUM CHLORIDE 0.9 % IJ SOLN
3.0000 mL | Freq: Two times a day (BID) | INTRAMUSCULAR | Status: DC
  Administered 2013-11-23 – 2013-11-28 (×9): 3 mL via INTRAVENOUS

## 2013-11-23 MED ORDER — ACETAMINOPHEN 650 MG RE SUPP: 650.0000 mg | Freq: Four times a day (QID) | RECTAL | Status: DC | PRN

## 2013-11-23 NOTE — ED Notes (Signed)
Pt states hes had nausea, abd pain and cramps for 1 week. He took zofran with minimal relief.

## 2013-11-23 NOTE — ED Notes (Addendum)
Pt presents with mid abd cramping and nausea x1 week, with symptoms increasing x2 days also experiencing indigestion. Pt states he was prescribed medication for "heartburn" yesterday with no relief. Pt states his symptoms start shortly after taking Rifampin

## 2013-11-23 NOTE — ED Notes (Signed)
EDPA Robyn at bedside to discuss test results and plan of care

## 2013-11-23 NOTE — H&P (Addendum)
[facility] History and Physical  Bradley [name] [id number] DOB: 1956/02/17 DOA: 11/23/2013  Referring physician:  Michele Mcalpine PCP:  [name], MD   Chief Complaint:  Abdominal pain  HPI:  The patient is a 58 y.o. year-old male with anxiety, depression, HTN, thyroid CA s/p resection, and history of congenitally abnl AV which was repaired when he was 58 years old.  He underwent uncomplicated AV replacement with bovine valve by [name] in 2011.  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  He was started on nafcillin and gentamicin, however he developed renal failure requiring dialysis.  He was discharged on vancomycin which he received hemodialysis and rifampin after a month-long hospitalization.  A week and a half later, he was seen in the infectious disease clinic with increasing left sternoclavicular pain. He was admitted for concerns of septic arthritis , and was found to have a perivalvular abscess of the aortic valve.  He underwent redo sternotomy with redo bentall procedure with homograft and debridement of root abscess.   He was successfully weaned from the ventilator and from vasopressors. He maintained normal sinus rhythm with amiodarone. His hospitalization was complicated by pain and swelling of his left arm, however he did not have DVT.  He was discharged on ancef + gentamicin at HD + oral rifampin.  Gentamicin was supposed to have been stopped on 4/14 and it is unclear if this medication has continued.  His rifampin nad ancef are to continue through 6/3.    When he was discharged from the hospital, he states that he was feeling well. He did not have nausea or abdominal pain although he had poor appetite. He felt well for the first 1-1/2-2 weeks, but then one half weeks ago, he developed increasing abdominal pain. He describes the pain as cramping  and sharp and diffuse. It occurs in both lower quadrants and upper quadrants in the radiate from the lower quadrants to the upper quadrants. It is better when resting still and worse when he is up and moving. He is had increasing nausea with heaves. He denies bilious, coffee-ground, or bloody emesis. He has been gassy. He denies diarrhea. He has soft very dark stools regularly.   Passing stools and gas does not improve his symptoms. He attributed his symptoms to his rifampin, and last night he decided not to take his evening dose. Despite not taking his evening dose, he developed severe 8/10 abdominal pain overnight that woke him up from sleep. He was up all night with abdominal pain and heaves. He developed some 4/10 substernal chest pressure this morning that was not associated with shortness of breath, diaphoresis, or nausea worse than prior.  He was concerned that perhaps his infection was worsening so he came to the emergency department. He denies fevers, chills, sinus congestion, cough, wheezing, dysuria. He denies diarrhea.  In the emergency department, temperature has not been obtained but his other vital signs are stable. White blood cell count 6, hemoglobin 10.5.  CT scan of abdomen and pelvis demonstrated small areas of ascites in the right lateral inguinal recess and deep anatomic pelvis without focal bowel wall thickening. He has some scarring over his surgical clips in the common femoral vessels. There is suggestion of a left lower known pneumonia versus atelectasis and small bilateral layering pleural effusions. There is also a punctate nonobstructing stone in the  lower pole of the right kidney.  He is being admitted for further evaluation of his abdominal pain and nausea.  Occult stool negative.  Lactic acid 1.73.  UA negative.  ECG stable and troponin neg.  I discussed the case with Doctor Johnnye Sima.  The emergency department physician notified Doctor Servando Snare of patient's admission.     eview of  Systems:  General:  Denies fevers, chills, weight loss or gain HEENT:  Denies changes to hearing and vision, rhinorrhea, sinus congestion, sore throat CV:  Denies chest pain and palpitations, lower extremity edema.  PULM:  Mild SOB with exertion GI:  Per HPI.   GU:  Denies dysuria, frequency, urgency ENDO:  Denies polyuria, polydipsia.   HEME:  Denies hematemesis, blood in stools, melena, abnormal bruising or bleeding.  LYMPH:  Denies lymphadenopathy.   MSK:  Denies arthralgias, myalgias.   DERM:  Denies skin rash or ulcer.   NEURO:  Denies focal numbness, weakness, slurred speech, confusion, facial droop.  PSYCH:  Denies anxiety and depression.    Past Medical History  Diagnosis Date  . Anxiety   . Depression   . Hypertension   . Hypothyroidism, postsurgical   . Aortic stenosis     s/p Bentall with bioprosthetic AVR 02/2010; Last echo (9/11): Moderate LVH, EF 45-50%, AVR functioning appropriately, aortic valve mean gradient 21, diastolic dysfunction. Chest MRA (2/13): Mild to moderate dilatation at the sinus of Valsalva at 4.1 cm, mild dilatation ascending aorta distal to the tube graft at 3.9 cm, moderate dilatation of the innominate artery a 2.1 cm;    . Hx of cardiac cath     a. LHC in 02/2010: normal cors  . Hx of echocardiogram 2014    Echo (10/14): Severe LVH, EF 60-65%, normal wall motion, grade 1 diastolic dysfunction, AVR functioning normally, mild aortic stenosis (mean 19), moderately dilated aorta, mild LAE, mild RVE  . Staphylococcus aureus bacteremia   . Prosthetic valve endocarditis   . Thyroid cancer   . Heart murmur   . ESRD on dialysis     "[facility]; TTS" (10/22/2013)   Past Surgical History  Procedure Laterality Date  . Cardiac catheterization  03/02/2010    NORMAL CORONARY ARTERY  . Sternotomy      REDO  . Transthoracic echocardiogram  03/2010    SHOWED MILD REDUCTION OF LV FUNCTION  . Thyroidectomy  ~ 2005  . Shoulder arthroscopy w/ rotator cuff repair  Right 2012  . Av fistula placement Left 10/08/2013    Procedure: ARTERIOVENOUS (AV) FISTULA CREATION- LEFT ARM; Radial Cephalic ;  Surgeon: Mal [name], MD;  Location: [place];  Service: Vascular;  Laterality: Left;  . Aortic valve replacement  2011  . Cardiac valve replacement    . Aortic valve repair  1968  . Tee without cardioversion N/A 10/24/2013    Procedure: TRANSESOPHAGEAL ECHOCARDIOGRAM (TEE);  Surgeon: Dorothy Spark, MD;  Location: [facility] ENDOSCOPY;  Service: Cardiovascular;  Laterality: N/A;  Deneen Harts procedure N/A 10/28/2013    Procedure: REDO BENTALL PROCEDURE, debridment of aoritc root abscess, replacement of aortic root, ascending aorta and aortic valve with homograft. Insertion of left femoral arterial line;  Surgeon: [name], MD;  Location: [place];  Service: Open Heart Surgery;  Laterality: N/A;  . Intraoperative transesophageal echocardiogram N/A 10/28/2013    Procedure: INTRAOPERATIVE TRANSESOPHAGEAL ECHOCARDIOGRAM;  Surgeon: [name], MD;  Location: [place];  Service: Open Heart Surgery;  Laterality: N/A;   Social History:  reports that  he has never smoked. He has never used smokeless tobacco. He reports that he does not drink alcohol or use illicit drugs. Lives at home with walker.  Does not drive.  ESRD T, H, Sa.  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  Allergies  Allergen Reactions  . Oxycodone     Gives patient nightmares  . Rifampin Nausea Only    Family History  Problem Relation Age of Onset  . Heart disease Father   . Thyroid cancer Sister   . Thyroid cancer Sister   . Kidney Stones Sister      Prior to Admission medications   Medication Sig Start Date End Date Taking? Authorizing Provider  amiodarone (PACERONE) 200 MG tablet Take 1 tablet (200 mg total) by mouth daily. 11/13/13  Yes Coolidge Breeze, PA-C  aspirin EC 81 MG tablet Take 81 mg by mouth daily.    Yes Historical Provider, MD  ceFAZolin (ANCEF) 2-3 GM-% SOLR Inject 2 g into the vein See  admin instructions. With hemodialysis every tues, thurs and sat 11/13/13  Yes Gina L Collins, PA-C  clonazePAM (KLONOPIN) 1 MG tablet Take 1 mg by mouth 2 (two) times daily as needed for anxiety.   Yes Historical Provider, MD  ferrous [id number] C-folic acid (TRINSICON / FOLTRIN) capsule Take 1 capsule by mouth daily. 11/13/13  Yes Coolidge Breeze, PA-C  gentamicin (GARAMYCIN) 1-0.9 MG/ML-% X 1 dose at dialysis on Saturday 11/13/13  Yes Gina L Collins, PA-C  levothyroxine (SYNTHROID, LEVOTHROID) 200 MCG tablet Take 200 mcg by mouth daily before breakfast.   Yes Historical Provider, MD  midodrine (PROAMATINE) 5 MG tablet Take 1 tablet (5 mg total) by mouth 2 (two) times daily with a meal. 11/13/13  Yes Gina L Collins, PA-C  mirtazapine (REMERON SOL-TAB) 15 MG disintegrating tablet Take 1 tablet (15 mg total) by mouth at bedtime. 11/13/13  Yes Coolidge Breeze, PA-C  Nutritional Supplements (FEEDING SUPPLEMENT, NEPRO CARB STEADY,) LIQD Take 237 mLs by mouth 2 (two) times daily between meals. 11/13/13  Yes Gina L Collins, PA-C  ondansetron (ZOFRAN-ODT) 4 MG disintegrating tablet Take 1 tablet (4 mg total) by mouth every 8 (eight) hours as needed for nausea or vomiting. 11/17/13  Yes [name], MD  pantoprazole (PROTONIX) 40 MG tablet Take 40 mg by mouth at bedtime.   Yes Historical Provider, MD  rifampin (RIFADIN) 300 MG capsule Take 1 capsule (300 mg total) by mouth 3 (three) times daily. Last doses= 01/01/2104 11/13/13  Yes Gina L Collins, PA-C  sertraline (ZOLOFT) 50 MG tablet Take 50 mg by mouth daily.   Yes Historical Provider, MD  sevelamer carbonate (RENVELA) 800 MG tablet Take 800-1,600 mg by mouth 3 (three) times daily with meals. Takes 1600mg  with larger amounts of food and 800mg  with smaller amounts of food   Yes Historical Provider, MD   Physical Exam: Filed Vitals:   11/23/13 1100 11/23/13 1115 11/23/13 1130 11/23/13 1603  BP: 139/98 136/94 136/91 129/88  Pulse: 88 88 86 92  Temp:     98.8 F (37.1 C)  TempSrc:    Oral  Resp:    16  SpO2: 100% 100% 99% 98%     General:   Caucasian male, no acute distress  Eyes:  PERRL, anicteric, non-injected.  ENT:  Nares clear.  OP clear, non-erythematous without plaques or exudates.  MMM.  Neck:  Supple without TM or JVD.    Lymph:  No cervical, supraclavicular, or submandibular LAD.  Cardiovascular:  RRR, normal S1, S2,  2/6 systolic murmur at the right upper sternal border with a sharp S2.  2+ pulses, warm extremities  Respiratory:   rales at the bilateral bases that are faint, no wheezing or rhonchi, no increased WOB.  Abdomen:  hyperactive BS.  Soft,  nondistended, diffusely tender to palpation without rebound or guarding  Skin:  No rashes or focal lesions.  Musculoskeletal:  Normal bulk and tone.  No LE edema.  Psychiatric:  A & O x 4.  Appropriate affect.  Neurologic:  CN 3-12 intact.  5/5 strength.  Sensation intact.  Labs on Admission:  Basic Metabolic Panel:  Recent Labs Lab 11/23/13 1045  NA 137  K 3.6*  CL 96  CO2 25  GLUCOSE 96  BUN 6  CREATININE 2.04*  CALCIUM 9.0   Liver Function Tests:  Recent Labs Lab 11/23/13 1045  AST 23  ALT 9  ALKPHOS 88  BILITOT 0.4  PROT 7.6  ALBUMIN 2.2*   No results found for this basename: LIPASE, AMYLASE,  in the last 168 hours  Recent Labs Lab 11/23/13 1045  AMMONIA 20   CBC:  Recent Labs Lab 11/23/13 1045  WBC 6.0  NEUTROABS 3.9  HGB 10.5*  HCT 34.1*  MCV 98.6  PLT 318   Cardiac Enzymes: No results found for this basename: CKTOTAL, CKMB, CKMBINDEX, TROPONINI,  in the last 168 hours  BNP (last 3 results)  Recent Labs  09/10/13 0753 09/14/13 0445  PROBNP 37349.0* >70000.0*   CBG: No results found for this basename: GLUCAP,  in the last 168 hours  Radiological Exams on Admission: Ct Abdomen Pelvis Wo Contrast  11/23/2013   CLINICAL DATA:  One week history of umbilical pain, cramping and nausea.  EXAM: CT ABDOMEN AND PELVIS  WITHOUT CONTRAST  TECHNIQUE: Multidetector CT imaging of the abdomen and pelvis was performed following the standard protocol without intravenous contrast.  COMPARISON:  Most recent prior CT abdomen/ pelvis 09/06/2013  FINDINGS: Lower Chest: Small bilateral layering pleural effusions. Mild dependent atelectasis in the right lower lobe. Significant atelectasis and possible consolidation in the visualized left lower lobe. Unchanged mild cardiomegaly. No pericardial effusion. Unremarkable distal thoracic esophagus.  Abdomen: Unenhanced CT was performed per clinician order. Lack of IV contrast limits sensitivity and specificity, especially for evaluation of abdominal/pelvic solid viscera. Within these limitations, unremarkable CT appearance of the stomach, duodenum, spleen, adrenal glands and pancreas. Normal hepatic contour morphology. Dystrophic calcification in the posterior right hemi liver is unchanged compared to prior. No discrete hepatic lesion. Geographic hypoattenuation in the left hemi-liver adjacent to the fissure for the falciform ligament is nonspecific but most suggestive of benign focal fatty infiltration. Gallbladder is unremarkable. No intra or extrahepatic biliary ductal dilatation.  No hydronephrosis. 3 mm nonobstructing stone in the lower pole of the right kidney. High attenuation foci within the left kidney likely represent small hemorrhagic cysts.  No evidence of obstruction or focal bowel wall thickening. Normal appendix in the right lower quadrant. The terminal ileum is unremarkable.  Pelvis: Small amount of intermediate attenuation ascites in the right lateral inguinal recess and with in the pelvis. Unremarkable appearance of the seminal vesicles. Mild prostatomegaly. Normal appearance of the bladder. Right indirect inguinal hernia containing omental fat and a small amount of pelvic ascites. Interval development of right inguinal masses compared to the prior study. The largest node measures 1.7  x 3.2 cm. There is associated overlying skin thickening. A second superficial nodular opacity measures 1.7 cm in diameter.  There are new surgical clips overlying the right common femoral vessels immediately subjacent to the nodular opacity.  Bones/Soft Tissues: No acute fracture or aggressive appearing lytic or blastic osseous lesion.  Vascular: Atherosclerotic vascular disease without significant stenosis or aneurysmal dilatation.  IMPRESSION: 1. Small amount of intermediate attenuation ascites in the right lateral inguinal recess and deep anatomic pelvis of on certain etiology. No evidence of bowel obstruction or focal bowel wall thickening. And occult infectious/inflammatory enteritis or colitis is a consideration. 2. Right direct inguinal hernia containing omental fat and a small amount of ascitic fluid. 3. Interval development of nodular masslike densities in the right superficial inguinal region overlying new surgical clips an the common femoral vessels. Query clinical history of recent common femoral endarterectomy or attempted femoral loop graft placement? Given the presence of the surgical clips, the collection is favored to reflect a postoperative seroma or lymphocele. In the appropriate clinical setting, abscess, hematoma or lymphadenopathy are additional possibilities. 4. Left lower lobe atelectasis and possible consolidation. Left lower lobe pneumonia difficult to exclude. 5. Small bilateral layering pleural effusions. 6. Punctate nonobstructing stone in the lower pole of the right kidney. 7. Atherosclerotic vascular calcifications.   Electronically Signed   By: Jacqulynn Cadet M.D.   On: 11/23/2013 13:39   Dg Chest 2 View  11/23/2013   CLINICAL DATA:  Mid chest pain and shortness of breath. History of open-heart surgery 4 weeks ago.  EXAM: CHEST  2 VIEW  COMPARISON:  11/07/2013  FINDINGS: Left jugular catheter has been removed. Right jugular dual-lumen central venous catheter remains in place with  tips overlying the right atrium, unchanged. The cardiac silhouette remains enlarged. There is improved aeration of the left lower lobe. There is a small left pleural effusion, decreased from prior. No definite right pleural effusion is identified. There is mild pulmonary vascular congestion without overt edema. No pneumothorax is seen. No acute osseous abnormality is identified.  IMPRESSION: 1. Cardiomegaly with pulmonary vascular congestion. 2. Small left pleural effusion, decreased from prior. 3. Improved aeration of the left lower lobe. 4. Interval removal of left jugular catheter.   Electronically Signed   By: Logan Bores   On: 11/23/2013 11:58   Ct Head Wo Contrast  11/23/2013   CLINICAL DATA:  Generalized weakness  EXAM: CT HEAD WITHOUT CONTRAST  TECHNIQUE: Contiguous axial images were obtained from the base of the skull through the vertex without intravenous contrast.  COMPARISON:  Prior head CT 11/07/2013  FINDINGS: Negative for acute intracranial hemorrhage, acute infarction, mass, mass effect, hydrocephalus or midline shift. Gray-white differentiation is preserved throughout. Stable focal hypoattenuation in the right centrum semiovale and left parietal subcortical and deep white matter. Globes and orbits are symmetric and unremarkable bilaterally. No focal soft tissue or calvarial abnormality. Normal aeration of the mastoid air cells and paranasal sinuses. .  IMPRESSION: 1. No acute intracranial abnormality. 2. No significant interval change in the appearance of the brain compared to recent prior imaging from 11/07/2013.   Electronically Signed   By: Jacqulynn Cadet M.D.   On: 11/23/2013 13:42    EKG:  pending  Assessment/Plan Active Problems:   Abdominal pain  ---  Abdominal pain, progressive over the last 2 weeks in patient with history of MSSA bacteremia, endocarditis.  Differential diagnosis includes intra-abdominal infection, IBS, medication side effect. Dr. Johnnye Sima mentioned possible  aortitis in our conversation.  Fluid collections appear too small to drain and patient is afebrile and without leukocytosis.  Rifampin is a common cause  of abdominal pain and nausea, however.  Lactic acid and occult are reassuring that patient is not having gut ischemia.  -  Admit to inpatient -  Blood cultures -  Fungal blood cultures -  Continue IV antibiotics -  Stop rifampin -  No diarrhea present -  Simethicone & zofran prn -  Consider levsin -  Consider GI consult if not improving  Chest pain, may be due to GERD/esophagitis from vomiting/heaving, but also concerning for post-surgical complication.  Although there is suggestion of pneumonia on CT, this is more likely atelectasis.  No recent resp sx and if his primary sx were caused by pneumonia, he would have gotten much sicker much faster.   -  Telemetry -  ECG:  NSR, incomplete LBBB, normal axis, normal deviation, no ST segment elevations or depressions.  Different that prior ECGs which were done post operatively, but no STEMI. -  Cycle troponins:  Initial mildly elevated but expected in this patient with recent heart surgery and ESRD.  RN to please notify MD if troponins are TRENDING UP.    -  PPI -  Avoid Aluminum containing medications in this ESRD patient  MSSA Endocarditis with recent open heart surgery -  Continue ancef and restart gentamicin, last day 6/3 -  Stop rifampin -  Continue amiodarone  Hypothyroidism, stable.  Continue synthroid  Anxiety and depression, stable.  Continue home clonazepam, mirtazepine, and sertraline  HTN, BP wnl.    ESRD, T,H,Sa -  Please contact nephrology in AM -  Continue midodrine  -  Continue sevelamer  Iron deficiency and anemia of ESRD -  Continue iron -  epo per nephro  Diet:  Renal  Access:  PIV IVF:  off Proph:  heparin  Code Status: FULL Family Communication: patient and his son Disposition Plan: Admit to telemetry  Time spent: 60 min [facility] Pager [phone]  If 7PM-7AM, please contact night-coverage [email or web address] Password [email or web address] 11/23/2013, 4:20 PM

## 2013-11-23 NOTE — Progress Notes (Signed)
ANTIBIOTIC CONSULT NOTE - INITIAL  Pharmacy Consult for Gentamicin Indication: MSSA endocarditis (diagnosed Feb 2015)  Allergies  Allergen Reactions  . Oxycodone     Gives patient nightmares  . Rifampin Nausea Only    Patient Measurements:   Weight: 83 kg  Vital Signs: Temp: 98.5 F (36.9 C) (04/26 1700) Temp src: Oral (04/26 1700) BP: 125/79 mmHg (04/26 1700) Pulse Rate: 87 (04/26 1700) Intake/Output from previous day:   Intake/Output from this shift:    Labs:  Recent Labs  11/23/13 1045  WBC 6.0  HGB 10.5*  PLT 318  CREATININE 2.04*   The CrCl is unknown because both a height and weight (above a minimum accepted value) are required for this calculation. No results found for this basename: VANCOTROUGH, Corlis Leak, VANCORANDOM, GENTTROUGH, GENTPEAK, GENTRANDOM, TOBRATROUGH, TOBRAPEAK, TOBRARND, AMIKACINPEAK, AMIKACINTROU, AMIKACIN,  in the last 72 hours   Microbiology: Recent Results (from the past 720 hour(s))  SURGICAL PCR SCREEN     Status: None   Collection Time    10/27/13  1:13 PM      Result Value Ref Range Status   MRSA, PCR NEGATIVE  NEGATIVE Final   Staphylococcus aureus NEGATIVE  NEGATIVE Final   Comment:            The Xpert SA Assay (FDA     approved for NASAL specimens     in patients over 46 years of age),     is one component of     a comprehensive surveillance     program.  Test performance has     been validated by [employer] for patients greater     than or equal to 72 year old.     It is not intended     to diagnose infection nor to     guide or monitor treatment.  TISSUE CULTURE     Status: None   Collection Time    10/28/13  1:20 PM      Result Value Ref Range Status   Specimen Description TISSUE AORTA   Final   Special Requests PT ON VANCOMYCIN AND ZINACEF   Final   Gram Stain     Final   Value: RARE WBC PRESENT,BOTH PMN AND MONONUCLEAR     NO ORGANISMS SEEN     Performed at [facility]   Culture     Final    Value: NO GROWTH 3 DAYS     Performed at [facility]   Report Status 10/31/2013 FINAL   Final  GRAM STAIN     Status: None   Collection Time    10/28/13  2:26 PM      Result Value Ref Range Status   Specimen Description ABSCESS AORTA   Final   Special Requests PT ON VANCOMYCIN AND ZINACEF   Final   Gram Stain     Final   Value: RARE WBC PRESENT, PREDOMINANTLY PMN     NO ORGANISMS SEEN   Report Status 10/28/2013 FINAL   Final  ANAEROBIC CULTURE     Status: None   Collection Time    10/28/13  2:26 PM      Result Value Ref Range Status   Specimen Description ABSCESS AORTA   Final   Special Requests PT ON VANCOMYCIN AND ZINACEF   Final   Gram Stain     Final   Value: RARE WBC PRESENT,BOTH PMN AND MONONUCLEAR     NO ORGANISMS SEEN  Performed at [facility]     Final   Value: NO ANAEROBES ISOLATED     Performed at [facility]   Report Status 11/02/2013 FINAL   Final  CULTURE, ROUTINE-ABSCESS     Status: None   Collection Time    10/28/13  2:26 PM      Result Value Ref Range Status   Specimen Description ABSCESS AORTA   Final   Special Requests PT ON VANCOMYCIN AND ZINACEF   Final   Gram Stain     Final   Value: RARE WBC PRESENT, PREDOMINANTLY PMN     NO SQUAMOUS EPITHELIAL CELLS SEEN     NO ORGANISMS SEEN     Performed at [facility]     Performed at [facility]   Culture     Final   Value: NO GROWTH 3 DAYS     Performed at [facility]   Report Status 10/31/2013 FINAL   Final  CLOSTRIDIUM DIFFICILE BY PCR     Status: None   Collection Time    11/02/13 10:04 AM      Result Value Ref Range Status   C difficile by pcr NEGATIVE  NEGATIVE Final  URINE CULTURE     Status: None   Collection Time    11/04/13  9:45 AM      Result Value Ref Range Status   Specimen Description URINE, CLEAN CATCH   Final   Special Requests Nafcillin, Rifampin, Gent Normal   Final   Culture  Setup Time     Final   Value: 11/04/2013  10:58     Performed at [facility]     Final   Value: NO GROWTH     Performed at [facility]   Culture     Final   Value: NO GROWTH     Performed at [facility]   Report Status 11/05/2013 FINAL   Final    Medical History: Past Medical History  Diagnosis Date  . Anxiety   . Depression   . Hypertension   . Hypothyroidism, postsurgical   . Aortic stenosis     s/p [place] with bioprosthetic AVR 02/2010; Last echo (9/11): Moderate LVH, EF 45-50%, AVR functioning appropriately, aortic valve mean gradient 21, diastolic dysfunction. Chest MRA (2/13): Mild to moderate dilatation at the sinus of Valsalva at 4.1 cm, mild dilatation ascending aorta distal to the tube graft at 3.9 cm, moderate dilatation of the innominate artery a 2.1 cm;    . Hx of cardiac cath     a. LHC in 02/2010: normal cors  . Hx of echocardiogram 2014    Echo (10/14): Severe LVH, EF 60-65%, normal wall motion, grade 1 diastolic dysfunction, AVR functioning normally, mild aortic stenosis (mean 19), moderately dilated aorta, mild LAE, mild RVE  . Staphylococcus aureus bacteremia   . Prosthetic valve endocarditis   . Thyroid cancer   . Heart murmur   . ESRD on dialysis     "Sneads; TTS" (10/22/2013)    Medications:  Scheduled:  . amiodarone  200 mg Oral Daily  . aspirin EC  81 mg Oral Daily  . [START ON 11/25/2013] ceFAZolin  2 g Intravenous Q T,Th,Sa-HD  . [START ON 11/24/2013] feeding supplement (NEPRO CARB STEADY)  237 mL Oral BID BM  . ferrous [id number] C-folic acid  1 capsule Oral Daily  . heparin  5,000 Units Subcutaneous 3 times  per day  . [START ON 11/24/2013] levothyroxine  200 mcg Oral QAC breakfast  . midodrine  5 mg Oral BID WC  . mirtazapine  15 mg Oral QHS  . pantoprazole  40 mg Oral BID AC  . sertraline  50 mg Oral Daily  . sevelamer carbonate  800-1,600 mg Oral TID WC  . simethicone  80 mg Oral QID  . sodium chloride  3 mL Intravenous Q12H    Assessment: 58 yo M with hx of MSSA endocarditis who was receiving cefazolin and gentamicin with out pt HD (TTS).  It appears that the patient completed 2 weeks of synergy with last gentamicin dose was scheduled for 4/14.  Will need to confirm with outpt dialysis center when they are open tomorrow.    Previous Gentamicin dose was 100mg  after each HD.  Pt readmitted with abdominal pain and nausea which could be attributed to his rifampin.  Rifampin has been discontinued at this time.  Goal of Therapy:  Pre-HD level <4  Plan:  Gentamicin random level with AM labs. Follow-up with outpt dialysis center to confirm last Gentamicin dose. Follow-up plans for continued treatment of endocarditis.  [employer], Pharm.D., BCPS Clinical Pharmacist Pager [phone] 11/23/2013 5:37 PM

## 2013-11-23 NOTE — ED Provider Notes (Signed)
Medical screening examination/treatment/procedure(s) were conducted as a shared visit with non-physician practitioner(s) and myself.  I personally evaluated the patient during the encounter. This patient is a 58 year old male with extensive medical history including valvular heart disease requiring valve replacement. He developed endocarditis and had an emergent valve replacement performed one month ago. He has been on gentamicin which caused renal failure requiring dialysis. He returns today with complaints of abdominal discomfort and nausea. He denies any chest pain or shortness of breath. He denies any fevers or chills.  On exam, vitals are stable and the patient is afebrile. Heart is regular rate and rhythm. There is a systolic ejection murmur audible. Lungs are clear and equal. Abdomen is soft. There is mild generalized tenderness to palpation without rebound or guarding. Extremities are without edema.  Workup reveals no elevation of white count and electrolytes are within his baseline. CT of the abdomen and pelvis reveals a small amount of ascites and multiple other nonemergent conditions. There is the suggestion of possible left lower lobe pneumonia, however I feel as though this is not likely due to the fact he has been on multiple antibiotics. Due to the complicated nature of this patient's history, I feel as though admission for observation is indicated. The hospitalist will admit.     Veryl Speak, MD 11/23/13 [phone]

## 2013-11-23 NOTE — ED Notes (Signed)
Attempt report 

## 2013-11-23 NOTE — ED Provider Notes (Signed)
CSN: [id number]     Arrival date & time 11/23/13  [id number] History   First MD Initiated Contact with Patient 11/23/13 0957     Chief Complaint  Patient presents with  . Abdominal Pain     (Consider location/radiation/quality/duration/timing/severity/associated sxs/prior Treatment) HPI Comments: Patient is a 58 year old male with a past medical history of aortic stenosis status post aortic valve repair 4 weeks ago, staph aureus bacteremia, prosthetic valve endocarditis, end-stage renal disease on dialysis Tuesday, Thursday and Saturday, cardiac catheterization normal in 2011, AV fistula placement March 2015 with recent hospital admission for sepsis 2 months ago who presents to the emergency department complaining of nausea, abdominal pain and chest pain x1-1/2 weeks. Nausea has been constant, temporary relief with Zofran, however shortly returning. Abdominal pain located in his lower abdomen, radiating up his sides to the top of his abdomen. He spoke with his home nurse along with primary care physician who suggested this may be reflux and was prescribed a medication for reflux. He has been taking this without relief. Denies fevers, states his wife has been monitoring his temperatures which have been around 99. He was on 6 weeks of IV antibiotics during his hospital admission, restarted on antibiotics 4 weeks ago after aortic valve repair for endocarditis, he is still currently on his antibiotics, Ancef, rifampin and gentamicin. Denies diarrhea or difficulty moving his bowels. Normal urination. No vomiting. States the chest pain is located in the center of his chest described as a soreness, he states this may be from dry heaving. Patient's son also states that the patient had 5 strokes while he was in the hospital. States his left arm has been weak since his AV fistula placement 10 days ago. No slurred speech or vision change.  Patient is a 58 y.o. male presenting with abdominal pain. The history is provided  by the patient.  Abdominal Pain Associated symptoms: chest pain and nausea     Past Medical History  Diagnosis Date  . Anxiety   . Depression   . Hypertension   . Hypothyroidism, postsurgical   . Aortic stenosis     s/p [place] with bioprosthetic AVR 02/2010; Last echo (9/11): Moderate LVH, EF 45-50%, AVR functioning appropriately, aortic valve mean gradient 21, diastolic dysfunction. Chest MRA (2/13): Mild to moderate dilatation at the sinus of Valsalva at 4.1 cm, mild dilatation ascending aorta distal to the tube graft at 3.9 cm, moderate dilatation of the innominate artery a 2.1 cm;    . Hx of cardiac cath     a. LHC in 02/2010: normal cors  . Hx of echocardiogram 2014    Echo (10/14): Severe LVH, EF 60-65%, normal wall motion, grade 1 diastolic dysfunction, AVR functioning normally, mild aortic stenosis (mean 19), moderately dilated aorta, mild LAE, mild RVE  . Staphylococcus aureus bacteremia   . Prosthetic valve endocarditis   . Thyroid cancer   . Heart murmur   . ESRD on dialysis     "[place]; TTS" (10/22/2013)   Past Surgical History  Procedure Laterality Date  . Cardiac catheterization  03/02/2010    NORMAL CORONARY ARTERY  . Sternotomy      REDO  . Transthoracic echocardiogram  03/2010    SHOWED MILD REDUCTION OF LV FUNCTION  . Thyroidectomy  ~ 2005  . Shoulder arthroscopy w/ rotator cuff repair Right 2012  . Av fistula placement Left 10/08/2013    Procedure: ARTERIOVENOUS (AV) FISTULA CREATION- LEFT ARM; Radial Cephalic ;  Surgeon: Mal [name], MD;  Location: [facility] OR;  Service: Vascular;  Laterality: Left;  . Aortic valve replacement  2011  . Cardiac valve replacement    . Aortic valve repair  1968  . Tee without cardioversion N/A 10/24/2013    Procedure: TRANSESOPHAGEAL ECHOCARDIOGRAM (TEE);  Surgeon: Dorothy Spark, MD;  Location: [facility] ENDOSCOPY;  Service: Cardiovascular;  Laterality: N/A;  Deneen Harts procedure N/A 10/28/2013    Procedure: REDO BENTALL PROCEDURE,  debridment of aoritc root abscess, replacement of aortic root, ascending aorta and aortic valve with homograft. Insertion of left femoral arterial line;  Surgeon: [name], MD;  Location: [place];  Service: Open Heart Surgery;  Laterality: N/A;  . Intraoperative transesophageal echocardiogram N/A 10/28/2013    Procedure: INTRAOPERATIVE TRANSESOPHAGEAL ECHOCARDIOGRAM;  Surgeon: [name], MD;  Location: [place];  Service: Open Heart Surgery;  Laterality: N/A;   Family History  Problem Relation Age of Onset  . Heart disease Father   . Thyroid cancer Sister   . Thyroid cancer Sister   . Kidney Stones Sister    History  Substance Use Topics  . Smoking status: Never Smoker   . Smokeless tobacco: Never Used  . Alcohol Use: No    Review of Systems  Cardiovascular: Positive for chest pain.  Gastrointestinal: Positive for nausea and abdominal pain.  All other systems reviewed and are negative.     Allergies  Oxycodone and Rifampin  Home Medications   Prior to Admission medications   Medication Sig Start Date End Date Taking? Authorizing Provider  amiodarone (PACERONE) 200 MG tablet Take 1 tablet (200 mg total) by mouth daily. 11/13/13  Yes Coolidge Breeze, PA-C  aspirin EC 81 MG tablet Take 81 mg by mouth daily.    Yes Historical Provider, MD  ceFAZolin (ANCEF) 2-3 GM-% SOLR Inject 2 g into the vein See admin instructions. With hemodialysis every tues, thurs and sat 11/13/13  Yes Gina L Collins, PA-C  clonazePAM (KLONOPIN) 1 MG tablet Take 1 mg by mouth 2 (two) times daily as needed for anxiety.   Yes Historical Provider, MD  ferrous [id number] C-folic acid (TRINSICON / FOLTRIN) capsule Take 1 capsule by mouth daily. 11/13/13  Yes Coolidge Breeze, PA-C  gentamicin (GARAMYCIN) 1-0.9 MG/ML-% X 1 dose at dialysis on Saturday 11/13/13  Yes Gina L Collins, PA-C  levothyroxine (SYNTHROID, LEVOTHROID) 200 MCG tablet Take 200 mcg by mouth daily before breakfast.   Yes Historical  Provider, MD  midodrine (PROAMATINE) 5 MG tablet Take 1 tablet (5 mg total) by mouth 2 (two) times daily with a meal. 11/13/13  Yes Gina L Collins, PA-C  mirtazapine (REMERON SOL-TAB) 15 MG disintegrating tablet Take 1 tablet (15 mg total) by mouth at bedtime. 11/13/13  Yes Coolidge Breeze, PA-C  Nutritional Supplements (FEEDING SUPPLEMENT, NEPRO CARB STEADY,) LIQD Take 237 mLs by mouth 2 (two) times daily between meals. 11/13/13  Yes Gina L Collins, PA-C  ondansetron (ZOFRAN-ODT) 4 MG disintegrating tablet Take 1 tablet (4 mg total) by mouth every 8 (eight) hours as needed for nausea or vomiting. 11/17/13  Yes [name], MD  pantoprazole (PROTONIX) 40 MG tablet Take 40 mg by mouth at bedtime.   Yes Historical Provider, MD  rifampin (RIFADIN) 300 MG capsule Take 1 capsule (300 mg total) by mouth 3 (three) times daily. Last doses= 01/01/2104 11/13/13  Yes Gina L Collins, PA-C  sertraline (ZOLOFT) 50 MG tablet Take 50 mg by mouth daily.   Yes Historical Provider, MD  sevelamer carbonate (  RENVELA) 800 MG tablet Take 800-1,600 mg by mouth 3 (three) times daily with meals. Takes 1600mg  with larger amounts of food and 800mg  with smaller amounts of food   Yes Historical Provider, MD   BP 136/91  Pulse 86  Resp 17  SpO2 99% Physical Exam  Nursing note and vitals reviewed. Constitutional: He is oriented to person, place, and time. He appears well-developed and well-nourished. No distress.  HENT:  Head: Normocephalic and atraumatic.  Mouth/Throat: Oropharynx is clear and moist.  Eyes: Conjunctivae and EOM are normal. Pupils are equal, round, and reactive to light.  Neck: Normal range of motion. Neck supple. No JVD present.  Cardiovascular: Normal rate, regular rhythm, normal heart sounds and intact distal pulses.   No extremity edema.  Pulmonary/Chest: Effort normal and breath sounds normal. No respiratory distress.  Abdominal: Soft. Normal appearance and bowel sounds are normal. He exhibits no  distension. There is generalized tenderness. There is guarding. There is no rigidity, no rebound and no CVA tenderness.  Musculoskeletal: Normal range of motion. He exhibits no edema.  Neurological: He is alert and oriented to person, place, and time. He has normal strength. No sensory deficit.  Speech fluent, goal oriented. Equal grip strength bilateral. Left arm shaky with movements, states normal since fistula placement. Fistula without signs of infection.  Skin: Skin is warm and dry. He is not diaphoretic.  Psychiatric: He has a normal mood and affect. His behavior is normal.    ED Course  Procedures (including critical care time) Labs Review Labs Reviewed  CBC WITH DIFFERENTIAL - Abnormal; Notable for the following:    RBC 3.46 (*)    Hemoglobin 10.5 (*)    HCT 34.1 (*)    RDW 23.9 (*)    Lymphocytes Relative 11 (*)    Monocytes Relative 17 (*)    Basophils Relative 2 (*)    All other components within normal limits  COMPREHENSIVE METABOLIC PANEL - Abnormal; Notable for the following:    Potassium 3.6 (*)    Creatinine, Ser 2.04 (*)    Albumin 2.2 (*)    GFR calc non Af Amer 35 (*)    GFR calc Af Amer 40 (*)    All other components within normal limits  URINALYSIS, ROUTINE W REFLEX MICROSCOPIC - Abnormal; Notable for the following:    Color, Urine AMBER (*)    APPearance CLOUDY (*)    Hgb urine dipstick MODERATE (*)    Protein, ur 100 (*)    All other components within normal limits  CULTURE, BLOOD (ROUTINE X 2)  CULTURE, BLOOD (ROUTINE X 2)  URINE CULTURE  AMMONIA  URINE MICROSCOPIC-ADD ON  I-STAT CG4 LACTIC ACID, ED  POC OCCULT BLOOD, ED    Imaging Review Ct Abdomen Pelvis Wo Contrast  11/23/2013   CLINICAL DATA:  One week history of umbilical pain, cramping and nausea.  EXAM: CT ABDOMEN AND PELVIS WITHOUT CONTRAST  TECHNIQUE: Multidetector CT imaging of the abdomen and pelvis was performed following the standard protocol without intravenous contrast.   COMPARISON:  Most recent prior CT abdomen/ pelvis 09/06/2013  FINDINGS: Lower Chest: Small bilateral layering pleural effusions. Mild dependent atelectasis in the right lower lobe. Significant atelectasis and possible consolidation in the visualized left lower lobe. Unchanged mild cardiomegaly. No pericardial effusion. Unremarkable distal thoracic esophagus.  Abdomen: Unenhanced CT was performed per clinician order. Lack of IV contrast limits sensitivity and specificity, especially for evaluation of abdominal/pelvic solid viscera. Within these limitations, unremarkable CT appearance of  the stomach, duodenum, spleen, adrenal glands and pancreas. Normal hepatic contour morphology. Dystrophic calcification in the posterior right hemi liver is unchanged compared to prior. No discrete hepatic lesion. Geographic hypoattenuation in the left hemi-liver adjacent to the fissure for the falciform ligament is nonspecific but most suggestive of benign focal fatty infiltration. Gallbladder is unremarkable. No intra or extrahepatic biliary ductal dilatation.  No hydronephrosis. 3 mm nonobstructing stone in the lower pole of the right kidney. High attenuation foci within the left kidney likely represent small hemorrhagic cysts.  No evidence of obstruction or focal bowel wall thickening. Normal appendix in the right lower quadrant. The terminal ileum is unremarkable.  Pelvis: Small amount of intermediate attenuation ascites in the right lateral inguinal recess and with in the pelvis. Unremarkable appearance of the seminal vesicles. Mild prostatomegaly. Normal appearance of the bladder. Right indirect inguinal hernia containing omental fat and a small amount of pelvic ascites. Interval development of right inguinal masses compared to the prior study. The largest node measures 1.7 x 3.2 cm. There is associated overlying skin thickening. A second superficial nodular opacity measures 1.7 cm in diameter. There are new surgical clips  overlying the right common femoral vessels immediately subjacent to the nodular opacity.  Bones/Soft Tissues: No acute fracture or aggressive appearing lytic or blastic osseous lesion.  Vascular: Atherosclerotic vascular disease without significant stenosis or aneurysmal dilatation.  IMPRESSION: 1. Small amount of intermediate attenuation ascites in the right lateral inguinal recess and deep anatomic pelvis of on certain etiology. No evidence of bowel obstruction or focal bowel wall thickening. And occult infectious/inflammatory enteritis or colitis is a consideration. 2. Right direct inguinal hernia containing omental fat and a small amount of ascitic fluid. 3. Interval development of nodular masslike densities in the right superficial inguinal region overlying new surgical clips an the common femoral vessels. Query clinical history of recent common femoral endarterectomy or attempted femoral loop graft placement? Given the presence of the surgical clips, the collection is favored to reflect a postoperative seroma or lymphocele. In the appropriate clinical setting, abscess, hematoma or lymphadenopathy are additional possibilities. 4. Left lower lobe atelectasis and possible consolidation. Left lower lobe pneumonia difficult to exclude. 5. Small bilateral layering pleural effusions. 6. Punctate nonobstructing stone in the lower pole of the right kidney. 7. Atherosclerotic vascular calcifications.   Electronically Signed   By: Jacqulynn Cadet M.D.   On: 11/23/2013 13:39   Dg Chest 2 View  11/23/2013   CLINICAL DATA:  Mid chest pain and shortness of breath. History of open-heart surgery 4 weeks ago.  EXAM: CHEST  2 VIEW  COMPARISON:  11/07/2013  FINDINGS: Left jugular catheter has been removed. Right jugular dual-lumen central venous catheter remains in place with tips overlying the right atrium, unchanged. The cardiac silhouette remains enlarged. There is improved aeration of the left lower lobe. There is a  small left pleural effusion, decreased from prior. No definite right pleural effusion is identified. There is mild pulmonary vascular congestion without overt edema. No pneumothorax is seen. No acute osseous abnormality is identified.  IMPRESSION: 1. Cardiomegaly with pulmonary vascular congestion. 2. Small left pleural effusion, decreased from prior. 3. Improved aeration of the left lower lobe. 4. Interval removal of left jugular catheter.   Electronically Signed   By: Logan Bores   On: 11/23/2013 11:58   Ct Head Wo Contrast  11/23/2013   CLINICAL DATA:  Generalized weakness  EXAM: CT HEAD WITHOUT CONTRAST  TECHNIQUE: Contiguous axial images were obtained from the  base of the skull through the vertex without intravenous contrast.  COMPARISON:  Prior head CT 11/07/2013  FINDINGS: Negative for acute intracranial hemorrhage, acute infarction, mass, mass effect, hydrocephalus or midline shift. Gray-white differentiation is preserved throughout. Stable focal hypoattenuation in the right centrum semiovale and left parietal subcortical and deep white matter. Globes and orbits are symmetric and unremarkable bilaterally. No focal soft tissue or calvarial abnormality. Normal aeration of the mastoid air cells and paranasal sinuses. .  IMPRESSION: 1. No acute intracranial abnormality. 2. No significant interval change in the appearance of the brain compared to recent prior imaging from 11/07/2013.   Electronically Signed   By: Jacqulynn Cadet M.D.   On: 11/23/2013 13:42     EKG Interpretation None      MDM   Final diagnoses:  Abdominal pain  Nausea   Patient presenting with abdominal pain and nausea, 2 recent admissions to the hospital for sepsis and endocarditis. He is on dialysis, receiving IV antibiotics through dialysis. He is well appearing and in no apparent distress, afebrile, stable vital signs. Abdomen with generalized tenderness, no peritoneal signs. Given recent history of multiple surgeries,  hospitalizations in severe illnesses, a broad workup was completed. Labs without any acute findings. CT head obtained given infarcts while patient was in hospital, no acute changes. CT with results as shown above. Patient also evaluated by my attending Dr. Stark Jock, and after discussion, given patient's recent severe illnesses, it is most appropriate to consult for admission. I spoke with Dr. Sheran Fava, [facility] who accepts patient for admission. A consult with cardiothoracic surgery to make them aware that patient is in the hospital for consultation given recent cardiothoracic surgery. Pt agreeable to plan.  Illene Labrador, PA-C 11/23/13 1545

## 2013-11-23 NOTE — ED Notes (Signed)
Patient transported to X-ray 

## 2013-11-24 DIAGNOSIS — R109 Unspecified abdominal pain: Secondary | ICD-10-CM

## 2013-11-24 DIAGNOSIS — N186 End stage renal disease: Secondary | ICD-10-CM

## 2013-11-24 DIAGNOSIS — R799 Abnormal finding of blood chemistry, unspecified: Secondary | ICD-10-CM

## 2013-11-24 DIAGNOSIS — E46 Unspecified protein-calorie malnutrition: Secondary | ICD-10-CM

## 2013-11-24 DIAGNOSIS — I5189 Other ill-defined heart diseases: Secondary | ICD-10-CM

## 2013-11-24 DIAGNOSIS — I5042 Chronic combined systolic (congestive) and diastolic (congestive) heart failure: Secondary | ICD-10-CM

## 2013-11-24 DIAGNOSIS — Y849 Medical procedure, unspecified as the cause of abnormal reaction of the patient, or of later complication, without mention of misadventure at the time of the procedure: Secondary | ICD-10-CM

## 2013-11-24 DIAGNOSIS — N179 Acute kidney failure, unspecified: Secondary | ICD-10-CM

## 2013-11-24 DIAGNOSIS — T827XXA Infection and inflammatory reaction due to other cardiac and vascular devices, implants and grafts, initial encounter: Principal | ICD-10-CM

## 2013-11-24 DIAGNOSIS — I2690 Septic pulmonary embolism without acute cor pulmonale: Secondary | ICD-10-CM

## 2013-11-24 DIAGNOSIS — E43 Unspecified severe protein-calorie malnutrition: Secondary | ICD-10-CM | POA: Diagnosis present

## 2013-11-24 DIAGNOSIS — R110 Nausea: Secondary | ICD-10-CM

## 2013-11-24 LAB — COMPREHENSIVE METABOLIC PANEL
ALT: 8 U/L (ref 0–53)
AST: 25 U/L (ref 0–37)
Albumin: 1.9 g/dL — ABNORMAL LOW (ref 3.5–5.2)
Alkaline Phosphatase: 77 U/L (ref 39–117)
BUN: 8 mg/dL (ref 6–23)
CO2: 21 mEq/L (ref 19–32)
Calcium: 8.6 mg/dL (ref 8.4–10.5)
Chloride: 98 mEq/L (ref 96–112)
Creatinine, Ser: 2.15 mg/dL — ABNORMAL HIGH (ref 0.50–1.35)
GFR calc Af Amer: 38 mL/min — ABNORMAL LOW (ref >90)
GFR calc non Af Amer: 33 mL/min — ABNORMAL LOW (ref >90)
Glucose, Bld: 101 mg/dL — ABNORMAL HIGH (ref 70–99)
Potassium: 3.8 mEq/L (ref 3.7–5.3)
Sodium: 136 mEq/L — ABNORMAL LOW (ref 137–147)
Total Bilirubin: 0.3 mg/dL (ref 0.3–1.2)
Total Protein: 6.7 g/dL (ref 6.0–8.3)

## 2013-11-24 LAB — URINE CULTURE
Colony Count: NO GROWTH
Culture: NO GROWTH

## 2013-11-24 LAB — CBC
HCT: 28.2 % — ABNORMAL LOW (ref 39.0–52.0)
Hemoglobin: 8.8 g/dL — ABNORMAL LOW (ref 13.0–17.0)
MCH: 30.9 pg (ref 26.0–34.0)
MCHC: 31.2 g/dL (ref 30.0–36.0)
MCV: 98.9 fL (ref 78.0–100.0)
Platelets: 306 10*3/uL (ref 150–400)
RBC: 2.85 MIL/uL — ABNORMAL LOW (ref 4.22–5.81)
RDW: 23.4 % — ABNORMAL HIGH (ref 11.5–15.5)
WBC: 9 10*3/uL (ref 4.0–10.5)

## 2013-11-24 LAB — CLOSTRIDIUM DIFFICILE BY PCR: Toxigenic C. Difficile by PCR: NEGATIVE

## 2013-11-24 LAB — GENTAMICIN LEVEL, RANDOM: Gentamicin Rm: 0.5 ug/mL

## 2013-11-24 LAB — TROPONIN I
Troponin I: 0.74 ng/mL (ref <0.30)
Troponin I: 0.63 ng/mL (ref <0.30)

## 2013-11-24 MED ORDER — DARBEPOETIN ALFA-POLYSORBATE 100 MCG/0.5ML IJ SOLN
100.0000 ug | INTRAMUSCULAR | Status: DC
  Filled 2013-11-24: qty 0.5

## 2013-11-24 MED ORDER — RENA-VITE PO TABS
1.0000 | ORAL_TABLET | Freq: Every day | ORAL | Status: DC
  Administered 2013-11-24 – 2013-11-27 (×4): 1 via ORAL
  Filled 2013-11-24 (×4): qty 1

## 2013-11-24 MED ORDER — NYSTATIN 100000 UNIT/ML MT SUSP
5.0000 mL | Freq: Four times a day (QID) | OROMUCOSAL | Status: DC
  Administered 2013-11-24 – 2013-11-26 (×9): 500000 [IU] via ORAL
  Filled 2013-11-24 (×11): qty 5

## 2013-11-24 MED ORDER — METOPROLOL TARTRATE 12.5 MG HALF TABLET
12.5000 mg | ORAL_TABLET | Freq: Four times a day (QID) | ORAL | Status: DC
  Administered 2013-11-24 – 2013-11-25 (×3): 12.5 mg via ORAL
  Filled 2013-11-24 (×6): qty 1

## 2013-11-24 NOTE — Consult Note (Signed)
[facility] Renal Consultation Note    Indication for Consultation:  Management of ESRD/hemodialysis; anemia, hypertension/volume and secondary hyperparathyroidism  HPI: Bradley [name] is a 58 y.o. male ESRD patient (TTS HD) with complex medical history including aortic valve replacement and recent admissions for MSSA bacteremia with AV endocarditis and septic emboli causing embolic strokes. He is status post redo sternotomy and redo Bentall procedure and currently on Ancef and Rifampin through 12/31/13. He presented to the ED on Sunday with complaints of abdominal pain, nausea and heaves and po intolerance x 1 week. He has experienced some GI upset with rifampin in the past so he briefly held the medication, but ultimately sought medical attention when no relief was achieved.  He is afebrile and without leukocytosis. CT head is negative. Chest x-ray notable for pulmonary vascular congestion and small left pleural effusion (resolving.) CT abdomen /pelvis shows no evidence of bowel obstruction. He endorses some  flatulence and poor movement of bowels but denies emesis, diarrhea, fever or chest pain. His last hemodialysis was on Saturday 4/25 as scheduled.  Past Medical History  Diagnosis Date  . Anxiety   . Depression   . Hypertension   . Hypothyroidism, postsurgical   . Aortic stenosis     s/p Bentall with bioprosthetic AVR 02/2010; Last echo (9/11): Moderate LVH, EF 45-50%, AVR functioning appropriately, aortic valve mean gradient 21, diastolic dysfunction. Chest MRA (2/13): Mild to moderate dilatation at the sinus of Valsalva at 4.1 cm, mild dilatation ascending aorta distal to the tube graft at 3.9 cm, moderate dilatation of the innominate artery a 2.1 cm;    . Hx of cardiac cath     a. LHC in 02/2010: normal cors  . Hx of echocardiogram 2014    Echo (10/14): Severe LVH, EF 60-65%, normal wall motion, grade 1 diastolic dysfunction, AVR functioning normally, mild aortic stenosis  (mean 19), moderately dilated aorta, mild LAE, mild RVE  . Staphylococcus aureus bacteremia   . Prosthetic valve endocarditis   . Thyroid cancer   . Heart murmur   . ESRD on dialysis     "[place]; TTS" (10/22/2013)   Past Surgical History  Procedure Laterality Date  . Cardiac catheterization  03/02/2010    NORMAL CORONARY ARTERY  . Sternotomy      REDO  . Transthoracic echocardiogram  03/2010    SHOWED MILD REDUCTION OF LV FUNCTION  . Thyroidectomy  ~ 2005  . Shoulder arthroscopy w/ rotator cuff repair Right 2012  . Av fistula placement Left 10/08/2013    Procedure: ARTERIOVENOUS (AV) FISTULA CREATION- LEFT ARM; Radial Cephalic ;  Surgeon: Mal [name], MD;  Location: [place];  Service: Vascular;  Laterality: Left;  . Aortic valve replacement  2011  . Cardiac valve replacement    . Aortic valve repair  1968  . Tee without cardioversion N/A 10/24/2013    Procedure: TRANSESOPHAGEAL ECHOCARDIOGRAM (TEE);  Surgeon: Dorothy Spark, MD;  Location: [facility] ENDOSCOPY;  Service: Cardiovascular;  Laterality: N/A;  Deneen Harts procedure N/A 10/28/2013    Procedure: REDO BENTALL PROCEDURE, debridment of aoritc root abscess, replacement of aortic root, ascending aorta and aortic valve with homograft. Insertion of left femoral arterial line;  Surgeon: [name], MD;  Location: [place];  Service: Open Heart Surgery;  Laterality: N/A;  . Intraoperative transesophageal echocardiogram N/A 10/28/2013    Procedure: INTRAOPERATIVE TRANSESOPHAGEAL ECHOCARDIOGRAM;  Surgeon: [name], MD;  Location: [place];  Service: Open Heart Surgery;  Laterality: N/A;  Family History  Problem Relation Age of Onset  . Heart disease Father   . Thyroid cancer Sister   . Thyroid cancer Sister   . Kidney Stones Sister    Social History:  reports that he has never smoked. He has never used smokeless tobacco. He reports that he does not drink alcohol or use illicit drugs. Allergies  Allergen Reactions  .  Oxycodone     Gives patient nightmares  . Rifampin Nausea Only   Prior to Admission medications   Medication Sig Start Date End Date Taking? Authorizing Provider  amiodarone (PACERONE) 200 MG tablet Take 1 tablet (200 mg total) by mouth daily. 11/13/13  Yes Coolidge Breeze, PA-C  aspirin EC 81 MG tablet Take 81 mg by mouth daily.    Yes Historical Provider, MD  ceFAZolin (ANCEF) 2-3 GM-% SOLR Inject 2 g into the vein See admin instructions. With hemodialysis every tues, thurs and sat 11/13/13  Yes Gina L Collins, PA-C  clonazePAM (KLONOPIN) 1 MG tablet Take 1 mg by mouth 2 (two) times daily as needed for anxiety.   Yes Historical Provider, MD  ferrous Q000111Q C-folic acid (TRINSICON / FOLTRIN) capsule Take 1 capsule by mouth daily. 11/13/13  Yes Coolidge Breeze, PA-C  gentamicin (GARAMYCIN) 1-0.9 MG/ML-% X 1 dose at dialysis on Saturday 11/13/13  Yes Gina L Collins, PA-C  levothyroxine (SYNTHROID, LEVOTHROID) 200 MCG tablet Take 200 mcg by mouth daily before breakfast.   Yes Historical Provider, MD  midodrine (PROAMATINE) 5 MG tablet Take 1 tablet (5 mg total) by mouth 2 (two) times daily with a meal. 11/13/13  Yes Gina L Collins, PA-C  mirtazapine (REMERON SOL-TAB) 15 MG disintegrating tablet Take 1 tablet (15 mg total) by mouth at bedtime. 11/13/13  Yes Coolidge Breeze, PA-C  Nutritional Supplements (FEEDING SUPPLEMENT, NEPRO CARB STEADY,) LIQD Take 237 mLs by mouth 2 (two) times daily between meals. 11/13/13  Yes Gina L Collins, PA-C  ondansetron (ZOFRAN-ODT) 4 MG disintegrating tablet Take 1 tablet (4 mg total) by mouth every 8 (eight) hours as needed for nausea or vomiting. 11/17/13  Yes [name], MD  pantoprazole (PROTONIX) 40 MG tablet Take 40 mg by mouth at bedtime.   Yes Historical Provider, MD  rifampin (RIFADIN) 300 MG capsule Take 1 capsule (300 mg total) by mouth 3 (three) times daily. Last doses= 01/01/2104 11/13/13  Yes Gina L Collins, PA-C  sertraline (ZOLOFT) 50 MG tablet  Take 50 mg by mouth daily.   Yes Historical Provider, MD  sevelamer carbonate (RENVELA) 800 MG tablet Take 800-1,600 mg by mouth 3 (three) times daily with meals. Takes 1600mg  with larger amounts of food and 800mg  with smaller amounts of food   Yes Historical Provider, MD   Current Facility-Administered Medications  Medication Dose Route Frequency Provider Last Rate Last Dose  . acetaminophen (TYLENOL) tablet 650 mg  650 mg Oral Q6H PRN Janece Canterbury, MD       Or  . acetaminophen (TYLENOL) suppository 650 mg  650 mg Rectal Q6H PRN Janece Canterbury, MD      . amiodarone (PACERONE) tablet 200 mg  200 mg Oral Daily Janece Canterbury, MD   200 mg at 11/24/13 0908  . aspirin EC tablet 81 mg  81 mg Oral Daily Janece Canterbury, MD   81 mg at 11/24/13 0908  . [START ON 11/25/2013] ceFAZolin (ANCEF) IVPB 2 g/50 mL premix  2 g Intravenous Q T,Th,Sa-HD Janece Canterbury, MD      .  clonazePAM (KLONOPIN) tablet 1 mg  1 mg Oral BID PRN Janece Canterbury, MD   1 mg at 11/23/13 2124  . feeding supplement (NEPRO CARB STEADY) liquid 237 mL  237 mL Oral BID BM Janece Canterbury, MD   237 mL at 11/24/13 1400  . ferrous Q000111Q C-folic acid (TRINSICON / FOLTRIN) capsule 1 capsule  1 capsule Oral Daily Janece Canterbury, MD   1 capsule at 11/24/13 0908  . heparin injection 5,000 Units  5,000 Units Subcutaneous 3 times per day Janece Canterbury, MD   5,000 Units at 11/24/13 1522  . levothyroxine (SYNTHROID, LEVOTHROID) tablet 200 mcg  200 mcg Oral QAC breakfast Janece Canterbury, MD   200 mcg at 11/24/13 0908  . midodrine (PROAMATINE) tablet 5 mg  5 mg Oral BID WC Janece Canterbury, MD   5 mg at 11/24/13 0908  . mirtazapine (REMERON SOL-TAB) disintegrating tablet 15 mg  15 mg Oral QHS Janece Canterbury, MD   15 mg at 11/23/13 2124  . nystatin (MYCOSTATIN) 100000 UNIT/ML suspension 500,000 Units  5 mL Oral QID Truman Hayward, MD   500,000 Units at 11/24/13 1522  . ondansetron (ZOFRAN) tablet 4 mg  4 mg Oral Q6H PRN  Janece Canterbury, MD       Or  . ondansetron (ZOFRAN) injection 4 mg  4 mg Intravenous Q6H PRN Janece Canterbury, MD      . pantoprazole (PROTONIX) EC tablet 40 mg  40 mg Oral BID AC Janece Canterbury, MD   40 mg at 11/24/13 0908  . sertraline (ZOLOFT) tablet 50 mg  50 mg Oral Daily Janece Canterbury, MD   50 mg at 11/24/13 0908  . sevelamer carbonate (RENVELA) tablet 800-1,600 mg  800-1,600 mg Oral TID WC Janece Canterbury, MD      . simethicone [facility]) chewable tablet 80 mg  80 mg Oral QID Janece Canterbury, MD   80 mg at 11/24/13 0908  . sodium chloride 0.9 % injection 3 mL  3 mL Intravenous Q12H Janece Canterbury, MD   3 mL at 11/24/13 0909   Labs: Basic Metabolic Panel:  Recent Labs Lab 11/23/13 1045 11/24/13 0550  NA 137 136*  K 3.6* 3.8  CL 96 98  CO2 25 21  GLUCOSE 96 101*  BUN 6 8  CREATININE 2.04* 2.15*  CALCIUM 9.0 8.6   Liver Function Tests:  Recent Labs Lab 11/23/13 1045 11/24/13 0550  AST 23 25  ALT 9 8  ALKPHOS 88 77  BILITOT 0.4 0.3  PROT 7.6 6.7  ALBUMIN 2.2* 1.9*    CBC:  Recent Labs Lab 11/23/13 1045 11/24/13 0550  WBC 6.0 9.0  NEUTROABS 3.9  --   HGB 10.5* 8.8*  HCT 34.1* 28.2*  MCV 98.6 98.9  PLT 318 306   Cardiac Enzymes:  Recent Labs Lab 11/23/13 1614 11/23/13 2222 11/24/13 0550  TROPONINI 0.67* 0.65* 0.74*   Studies/Results: Ct Abdomen Pelvis Wo Contrast  11/23/2013   CLINICAL DATA:  One week history of umbilical pain, cramping and nausea.  EXAM: CT ABDOMEN AND PELVIS WITHOUT CONTRAST  TECHNIQUE: Multidetector CT imaging of the abdomen and pelvis was performed following the standard protocol without intravenous contrast.  COMPARISON:  Most recent prior CT abdomen/ pelvis 09/06/2013  FINDINGS: Lower Chest: Small bilateral layering pleural effusions. Mild dependent atelectasis in the right lower lobe. Significant atelectasis and possible consolidation in the visualized left lower lobe. Unchanged mild cardiomegaly. No pericardial effusion.  Unremarkable distal thoracic esophagus.  Abdomen: Unenhanced CT was performed per  clinician order. Lack of IV contrast limits sensitivity and specificity, especially for evaluation of abdominal/pelvic solid viscera. Within these limitations, unremarkable CT appearance of the stomach, duodenum, spleen, adrenal glands and pancreas. Normal hepatic contour morphology. Dystrophic calcification in the posterior right hemi liver is unchanged compared to prior. No discrete hepatic lesion. Geographic hypoattenuation in the left hemi-liver adjacent to the fissure for the falciform ligament is nonspecific but most suggestive of benign focal fatty infiltration. Gallbladder is unremarkable. No intra or extrahepatic biliary ductal dilatation.  No hydronephrosis. 3 mm nonobstructing stone in the lower pole of the right kidney. High attenuation foci within the left kidney likely represent small hemorrhagic cysts.  No evidence of obstruction or focal bowel wall thickening. Normal appendix in the right lower quadrant. The terminal ileum is unremarkable.  Pelvis: Small amount of intermediate attenuation ascites in the right lateral inguinal recess and with in the pelvis. Unremarkable appearance of the seminal vesicles. Mild prostatomegaly. Normal appearance of the bladder. Right indirect inguinal hernia containing omental fat and a small amount of pelvic ascites. Interval development of right inguinal masses compared to the prior study. The largest node measures 1.7 x 3.2 cm. There is associated overlying skin thickening. A second superficial nodular opacity measures 1.7 cm in diameter. There are new surgical clips overlying the right common femoral vessels immediately subjacent to the nodular opacity.  Bones/Soft Tissues: No acute fracture or aggressive appearing lytic or blastic osseous lesion.  Vascular: Atherosclerotic vascular disease without significant stenosis or aneurysmal dilatation.  IMPRESSION: 1. Small amount of  intermediate attenuation ascites in the right lateral inguinal recess and deep anatomic pelvis of on certain etiology. No evidence of bowel obstruction or focal bowel wall thickening. And occult infectious/inflammatory enteritis or colitis is a consideration. 2. Right direct inguinal hernia containing omental fat and a small amount of ascitic fluid. 3. Interval development of nodular masslike densities in the right superficial inguinal region overlying new surgical clips an the common femoral vessels. Query clinical history of recent common femoral endarterectomy or attempted femoral loop graft placement? Given the presence of the surgical clips, the collection is favored to reflect a postoperative seroma or lymphocele. In the appropriate clinical setting, abscess, hematoma or lymphadenopathy are additional possibilities. 4. Left lower lobe atelectasis and possible consolidation. Left lower lobe pneumonia difficult to exclude. 5. Small bilateral layering pleural effusions. 6. Punctate nonobstructing stone in the lower pole of the right kidney. 7. Atherosclerotic vascular calcifications.   Electronically Signed   By: Jacqulynn Cadet M.D.   On: 11/23/2013 13:39   Dg Chest 2 View  11/23/2013   CLINICAL DATA:  Mid chest pain and shortness of breath. History of open-heart surgery 4 weeks ago.  EXAM: CHEST  2 VIEW  COMPARISON:  11/07/2013  FINDINGS: Left jugular catheter has been removed. Right jugular dual-lumen central venous catheter remains in place with tips overlying the right atrium, unchanged. The cardiac silhouette remains enlarged. There is improved aeration of the left lower lobe. There is a small left pleural effusion, decreased from prior. No definite right pleural effusion is identified. There is mild pulmonary vascular congestion without overt edema. No pneumothorax is seen. No acute osseous abnormality is identified.  IMPRESSION: 1. Cardiomegaly with pulmonary vascular congestion. 2. Small left  pleural effusion, decreased from prior. 3. Improved aeration of the left lower lobe. 4. Interval removal of left jugular catheter.   Electronically Signed   By: Logan Bores   On: 11/23/2013 11:58   Ct Head Wo Contrast  11/23/2013   CLINICAL DATA:  Generalized weakness  EXAM: CT HEAD WITHOUT CONTRAST  TECHNIQUE: Contiguous axial images were obtained from the base of the skull through the vertex without intravenous contrast.  COMPARISON:  Prior head CT 11/07/2013  FINDINGS: Negative for acute intracranial hemorrhage, acute infarction, mass, mass effect, hydrocephalus or midline shift. Gray-white differentiation is preserved throughout. Stable focal hypoattenuation in the right centrum semiovale and left parietal subcortical and deep white matter. Globes and orbits are symmetric and unremarkable bilaterally. No focal soft tissue or calvarial abnormality. Normal aeration of the mastoid air cells and paranasal sinuses. .  IMPRESSION: 1. No acute intracranial abnormality. 2. No significant interval change in the appearance of the brain compared to recent prior imaging from 11/07/2013.   Electronically Signed   By: Jacqulynn Cadet M.D.   On: 11/23/2013 13:42    ROS: Abdominal pain, nausea, flatulence, mild constipation, poor appetite. 10 pt ROS asked and answered. All systems negative except as above.   Physical Exam: Filed Vitals:   11/23/13 2106 11/24/13 0510 11/24/13 0900 11/24/13 1300  BP:  135/84 125/81 139/93  Pulse:  108 104 74  Temp:  99.4 F (37.4 C) 99.1 F (37.3 C) 99.5 F (37.5 C)  TempSrc:  Oral Oral Oral  Resp:  18 19 18   Height: 6\' 1"  (1.854 m)     Weight:      SpO2:  98% 94% 100%     General: Well developed, well nourished, in no acute distress. Pale Head: Normocephalic, atraumatic, sclera non-icteric, mucus membranes are moist Fundi unremarkable Neck: Supple. JVD not elevated. Lungs: Diminished as bases. No wheezes, rales, or rhonchi. Breathing is unlabored. Rales L  base Heart: Tachy regular, 2/6 murmur RUSB No rubs, or gallops appreciated. Midsternal wound healing well Abdomen: Soft, trace distention. Diffusely tender with normoactive bowel sounds. . M-S:  Strength and tone appear normal for age. Lower extremities:without edema or ischemic changes, no open wounds  Neuro: Alert and oriented X 3. Moves all extremities spontaneously. Psych:  Responds to questions appropriately with a normal affect. Dialysis Access: Rt I-J TDC, maturing LFA AVF + bruit  Dialysis Orders: [facility]: [facility]. EDW 84 kg HD Bath 3K/2.25 Ca Time 4:00 Heparin 2500. Access Rt I-J TDC BFR 400 DFR 800 (maturing LFA AVF placed 3/11)  Hectorol 0 mcg IV/HD Aranesp 100 weekly effective 4/28   Units IV/HD  Venofer 0 (Fe load completed 4/21)  Recent labs: Hgb 9.4, Tsat 28%, Phos 4.1, PTH 77  Assessment/Plan: 1. Abdominal Pain/Nausea - Mgmt per primary. Afebrile, no leukocytosis. Small ascites and possible seroma. Nothing substantial enough to drain. Cultures pending. Rifampin d/c'd. GI consult planned if no improvement 2. Elevated troponins - Cardiology following. Presumed secondary to demand ischemia. No CP 3. Prosthetic valve endocarditis/paravalvular abscess - On Ancef through 12/31/13 per ID. Rifampin held per #1 above. 4. ESRD -  TTS, last HD Sat. Creatinine here is 2.15. Will hold of HD for now per MD Low Scr, but not a lot of urine.  Will see if has some function and can handle Na and water metab.   5. Hypertension/volume  - SBPs 120s-130s on Midodrine BID. Mild volume excess on CXR with prior pleural effusion. Up 1.2kg by wts 6. Anemia  - Hgb 8.8. Previously on 20K units op Epo per HD with orders to begin Aranesp 100 weekly effective 4/28. Will order IV for now pending plans for HD. Last Tsat 28%. Fe Load completed [id number]. 7. Metabolic bone disease -  Ca 8.6 (10.3 corrected). Last Phos 4.1 outpatient. Hold renvela given GI issues and poor po intake. Last PTH 77. Not on Vit  D. 8. Malnutrition - Not tolerating PO. Only wants Nepro. Multivit. 9. Hypothyroidism - on synthroid   Sonnie Alamo, PA-C [facility] Pager [phone] 11/24/2013, 3:34 PM I have seen and examined this patient and agree with the plan of care seen, eval, examined. Patient and daughter counseled and questions answered. Joyice Faster Lamees Gable 11/24/2013, 4:39 PM

## 2013-11-24 NOTE — Progress Notes (Signed)
[facility] PROGRESS NOTE  Bradley [name] [id number] DOB: 23-May-1956 DOA: 11/23/2013 PCP: [name], MD  Assessment/Plan: 1. Abdominal pain- patient has been complaining of abdominal pain over the past 2 weeks. CT abdomen pelvis was done which only shows small ascites, no significant pathology. ID has recommended to stop rifampin which could be causing abdominal pain. If no improvement in pain after stop rifampin, consider GI consultation 2. Diarrhea- will check stool for C. difficile. Patient says that he is having 2-3 loose stools everyday. 3     Elevated troponin- patient underwent recent sternotomywith redo bentall procedure with homograft and debridement of root abscess. Patient now has elevated troponin, will get cardiology consultation. 3. MSSA prosthetic valve endocarditis - patient is now of gentamicin and rifampin currently on Ancef 2 g regimen dialysis for total 8 weeks June 3 his last day of Ancef 4. End-stage renal disease- patient currently on dialysis Tuesday Thursday and Saturday. Nephrology has been informed for hemodialysis 5. Hypothyroidism- continue Synthroid   Code Status: Full code Family Communication: No family at bedside Disposition Plan: Home when  stable   Consultants:  Cardiology  Infectious disease  Nephrology  Procedures:  None  Antibiotics:  Cefazolin 2 gm T-T-S  HPI/Subjective: Patient seen and examined, admitted with abdominal pain, nausea vomiting. Patient still not eating and still complains of abdominal pain. Also complains of diarrhea. Yesterday patient complained of chest pain and has mild elevation of troponin.  Objective: Filed Vitals:   11/24/13 1300  BP: 139/93  Pulse: 74  Temp: 99.5 F (37.5 C)  Resp: 18    Intake/Output Summary (Last 24 hours) at 11/24/13 1652 Last data filed at 11/24/13 1426  Gross per 24 hour  Intake    240 ml  Output    100 ml  Net    140 ml   Filed Weights   11/23/13 2014   Weight: 85.2 kg (187 lb 13.3 oz)    Exam:  Physical Exam: Head: Normocephalic, atraumatic.  Eyes: No signs of jaundice, EOMI Nose: Mucous membranes dry.  Throat: Oropharynx nonerythematous, no exudate appreciated.  Neck: supple,No deformities, masses, or tenderness noted. Lungs: Normal respiratory effort. B/L Clear to auscultation, no crackles or wheezes.  Heart: Regular RR. S1 and S2 normal  Abdomen: BS normoactive. Soft, Nondistended, positive mild generalized tenderness. Extremities: No pretibial edema, no erythema   Data Reviewed: Basic Metabolic Panel:  Recent Labs Lab 11/23/13 1045 11/24/13 0550  NA 137 136*  K 3.6* 3.8  CL 96 98  CO2 25 21  GLUCOSE 96 101*  BUN 6 8  CREATININE 2.04* 2.15*  CALCIUM 9.0 8.6   Liver Function Tests:  Recent Labs Lab 11/23/13 1045 11/24/13 0550  AST 23 25  ALT 9 8  ALKPHOS 88 77  BILITOT 0.4 0.3  PROT 7.6 6.7  ALBUMIN 2.2* 1.9*   No results found for this basename: LIPASE, AMYLASE,  in the last 168 hours  Recent Labs Lab 11/23/13 1045  AMMONIA 20   CBC:  Recent Labs Lab 11/23/13 1045 11/24/13 0550  WBC 6.0 9.0  NEUTROABS 3.9  --   HGB 10.5* 8.8*  HCT 34.1* 28.2*  MCV 98.6 98.9  PLT 318 306   Cardiac Enzymes:  Recent Labs Lab 11/23/13 1614 11/23/13 2222 11/24/13 0550  TROPONINI 0.67* 0.65* 0.74*   BNP (last 3 results)  Recent Labs  09/10/13 0753 09/14/13 0445  PROBNP 37349.0* >70000.0*   CBG: No results found for this basename: GLUCAP,  in the last  168 hours  Recent Results (from the past 240 hour(s))  CULTURE, BLOOD (ROUTINE X 2)     Status: None   Collection Time    11/23/13 10:24 AM      Result Value Ref Range Status   Specimen Description BLOOD RIGHT ARM   Final   Special Requests BOTTLES DRAWN AEROBIC AND ANAEROBIC 5CC   Final   Culture  Setup Time     Final   Value: 11/23/2013 18:28     Performed at [facility]   Culture     Final   Value:        BLOOD CULTURE RECEIVED  NO GROWTH TO DATE CULTURE WILL BE HELD FOR 5 DAYS BEFORE ISSUING A FINAL NEGATIVE REPORT     Performed at [facility]   Report Status PENDING   Incomplete  CULTURE, BLOOD (ROUTINE X 2)     Status: None   Collection Time    11/23/13 10:29 AM      Result Value Ref Range Status   Specimen Description BLOOD RIGHT ARM   Final   Special Requests BOTTLES DRAWN AEROBIC AND ANAEROBIC 10CC   Final   Culture  Setup Time     Final   Value: 11/23/2013 18:28     Performed at [facility]   Culture     Final   Value:        BLOOD CULTURE RECEIVED NO GROWTH TO DATE CULTURE WILL BE HELD FOR 5 DAYS BEFORE ISSUING A FINAL NEGATIVE REPORT     Performed at [facility]   Report Status PENDING   Incomplete     Studies: Ct Abdomen Pelvis Wo Contrast  11/23/2013   CLINICAL DATA:  One week history of umbilical pain, cramping and nausea.  EXAM: CT ABDOMEN AND PELVIS WITHOUT CONTRAST  TECHNIQUE: Multidetector CT imaging of the abdomen and pelvis was performed following the standard protocol without intravenous contrast.  COMPARISON:  Most recent prior CT abdomen/ pelvis 09/06/2013  FINDINGS: Lower Chest: Small bilateral layering pleural effusions. Mild dependent atelectasis in the right lower lobe. Significant atelectasis and possible consolidation in the visualized left lower lobe. Unchanged mild cardiomegaly. No pericardial effusion. Unremarkable distal thoracic esophagus.  Abdomen: Unenhanced CT was performed per clinician order. Lack of IV contrast limits sensitivity and specificity, especially for evaluation of abdominal/pelvic solid viscera. Within these limitations, unremarkable CT appearance of the stomach, duodenum, spleen, adrenal glands and pancreas. Normal hepatic contour morphology. Dystrophic calcification in the posterior right hemi liver is unchanged compared to prior. No discrete hepatic lesion. Geographic hypoattenuation in the left hemi-liver adjacent to the fissure for the  falciform ligament is nonspecific but most suggestive of benign focal fatty infiltration. Gallbladder is unremarkable. No intra or extrahepatic biliary ductal dilatation.  No hydronephrosis. 3 mm nonobstructing stone in the lower pole of the right kidney. High attenuation foci within the left kidney likely represent small hemorrhagic cysts.  No evidence of obstruction or focal bowel wall thickening. Normal appendix in the right lower quadrant. The terminal ileum is unremarkable.  Pelvis: Small amount of intermediate attenuation ascites in the right lateral inguinal recess and with in the pelvis. Unremarkable appearance of the seminal vesicles. Mild prostatomegaly. Normal appearance of the bladder. Right indirect inguinal hernia containing omental fat and a small amount of pelvic ascites. Interval development of right inguinal masses compared to the prior study. The largest node measures 1.7 x 3.2 cm. There is associated overlying skin thickening. A second superficial  nodular opacity measures 1.7 cm in diameter. There are new surgical clips overlying the right common femoral vessels immediately subjacent to the nodular opacity.  Bones/Soft Tissues: No acute fracture or aggressive appearing lytic or blastic osseous lesion.  Vascular: Atherosclerotic vascular disease without significant stenosis or aneurysmal dilatation.  IMPRESSION: 1. Small amount of intermediate attenuation ascites in the right lateral inguinal recess and deep anatomic pelvis of on certain etiology. No evidence of bowel obstruction or focal bowel wall thickening. And occult infectious/inflammatory enteritis or colitis is a consideration. 2. Right direct inguinal hernia containing omental fat and a small amount of ascitic fluid. 3. Interval development of nodular masslike densities in the right superficial inguinal region overlying new surgical clips an the common femoral vessels. Query clinical history of recent common femoral endarterectomy or  attempted femoral loop graft placement? Given the presence of the surgical clips, the collection is favored to reflect a postoperative seroma or lymphocele. In the appropriate clinical setting, abscess, hematoma or lymphadenopathy are additional possibilities. 4. Left lower lobe atelectasis and possible consolidation. Left lower lobe pneumonia difficult to exclude. 5. Small bilateral layering pleural effusions. 6. Punctate nonobstructing stone in the lower pole of the right kidney. 7. Atherosclerotic vascular calcifications.   Electronically Signed   By: Jacqulynn Cadet M.D.   On: 11/23/2013 13:39   Dg Chest 2 View  11/23/2013   CLINICAL DATA:  Mid chest pain and shortness of breath. History of open-heart surgery 4 weeks ago.  EXAM: CHEST  2 VIEW  COMPARISON:  11/07/2013  FINDINGS: Left jugular catheter has been removed. Right jugular dual-lumen central venous catheter remains in place with tips overlying the right atrium, unchanged. The cardiac silhouette remains enlarged. There is improved aeration of the left lower lobe. There is a small left pleural effusion, decreased from prior. No definite right pleural effusion is identified. There is mild pulmonary vascular congestion without overt edema. No pneumothorax is seen. No acute osseous abnormality is identified.  IMPRESSION: 1. Cardiomegaly with pulmonary vascular congestion. 2. Small left pleural effusion, decreased from prior. 3. Improved aeration of the left lower lobe. 4. Interval removal of left jugular catheter.   Electronically Signed   By: Logan Bores   On: 11/23/2013 11:58   Ct Head Wo Contrast  11/23/2013   CLINICAL DATA:  Generalized weakness  EXAM: CT HEAD WITHOUT CONTRAST  TECHNIQUE: Contiguous axial images were obtained from the base of the skull through the vertex without intravenous contrast.  COMPARISON:  Prior head CT 11/07/2013  FINDINGS: Negative for acute intracranial hemorrhage, acute infarction, mass, mass effect, hydrocephalus or  midline shift. Gray-white differentiation is preserved throughout. Stable focal hypoattenuation in the right centrum semiovale and left parietal subcortical and deep white matter. Globes and orbits are symmetric and unremarkable bilaterally. No focal soft tissue or calvarial abnormality. Normal aeration of the mastoid air cells and paranasal sinuses. .  IMPRESSION: 1. No acute intracranial abnormality. 2. No significant interval change in the appearance of the brain compared to recent prior imaging from 11/07/2013.   Electronically Signed   By: Jacqulynn Cadet M.D.   On: 11/23/2013 13:42    Scheduled Meds: . amiodarone  200 mg Oral Daily  . aspirin EC  81 mg Oral Daily  . [START ON 11/25/2013] ceFAZolin  2 g Intravenous Q T,Th,Sa-HD  . [START ON 11/27/2013] darbepoetin (ARANESP) injection - DIALYSIS  100 mcg Intravenous Q Thu-HD  . feeding supplement (NEPRO CARB STEADY)  237 mL Oral BID BM  . heparin  5,000 Units Subcutaneous 3 times per day  . levothyroxine  200 mcg Oral QAC breakfast  . metoprolol tartrate  12.5 mg Oral QID  . midodrine  5 mg Oral BID WC  . mirtazapine  15 mg Oral QHS  . multivitamin  1 tablet Oral QHS  . nystatin  5 mL Oral QID  . pantoprazole  40 mg Oral BID AC  . sertraline  50 mg Oral Daily  . simethicone  80 mg Oral QID  . sodium chloride  3 mL Intravenous Q12H   Continuous Infusions:   Active Problems:   Abdominal pain   Severe protein-calorie malnutrition    Time spent: *25 minutes    [facility] Pager [phone]. If 7PM-7AM, please contact night-coverage at [email or web address], password [facility] 11/24/2013, 4:52 PM  LOS: 1 day

## 2013-11-24 NOTE — Progress Notes (Signed)
[name]     Day # 27  beta lactam post surgery  Received 14 days of gentamicin  Received #25 days of post operative rifampin   Subjective: Developed severe abdominal pain, nausea, dry heaves thought due to rifampin but did not resolve with stopping rifampin  Antibiotics:  Anti-infectives   Start     Dose/Rate Route Frequency Ordered Stop   11/25/13 1200  ceFAZolin (ANCEF) IVPB 2 g/50 mL premix     2 g 100 mL/hr over 30 Minutes Intravenous Every T-Th-Sa (Hemodialysis) 11/23/13 1658        Medications: Scheduled Meds: . amiodarone  200 mg Oral Daily  . aspirin EC  81 mg Oral Daily  . [START ON 11/25/2013] ceFAZolin  2 g Intravenous Q T,Th,Sa-HD  . feeding supplement (NEPRO CARB STEADY)  237 mL Oral BID BM  . ferrous [id number] C-folic acid  1 capsule Oral Daily  . heparin  5,000 Units Subcutaneous 3 times per day  . levothyroxine  200 mcg Oral QAC breakfast  . midodrine  5 mg Oral BID WC  . mirtazapine  15 mg Oral QHS  . nystatin  5 mL Oral QID  . pantoprazole  40 mg Oral BID AC  . sertraline  50 mg Oral Daily  . sevelamer carbonate  800-1,600 mg Oral TID WC  . simethicone  80 mg Oral QID  . sodium chloride  3 mL Intravenous Q12H   Continuous Infusions:   PRN Meds:.acetaminophen, acetaminophen, clonazePAM, ondansetron (ZOFRAN) IV, ondansetron   Objective: Weight change:   Intake/Output Summary (Last 24 hours) at 11/24/13 1511 Last data filed at 11/24/13 1426  Gross per 24 hour  Intake    240 ml  Output    100 ml  Net    140 ml   Blood pressure 139/93, pulse 74, temperature 99.5 F (37.5 C), temperature source Oral, resp. rate 18, height 6\' 1"  (1.854 m), weight 187 lb 13.3 oz (85.2 kg), SpO2 100.00%. Temp:  [98.5 F (36.9 C)-99.5 F (37.5 C)] 99.5 F (37.5 C) (04/27 1300) Pulse Rate:  [74-108] 74 (04/27 1300) Resp:  [16-19] 18 (04/27 1300) BP: (125-139)/(79-93) 139/93 mmHg (04/27 1300) SpO2:  [94 %-100 %] 100  % (04/27 1300) Weight:  [187 lb 13.3 oz (85.2 kg)] 187 lb 13.3 oz (85.2 kg) (04/26 2014)  Physical Exam: General:aox 3 ,NAD op tongue with coating c/w thrush CV: RRR, systolic murmur Pulm: CTA anteriorly GI: tender diffusely, esp about epigastrium Skin:   Sternal load is feeling well:      Right groin wound with area that is raised with possible hematoma      NEuro : nonfocal  Neuro nonfocal Lab Results:  Recent Labs  11/23/13 1045 11/24/13 0550  WBC 6.0 9.0  HGB 10.5* 8.8*  HCT 34.1* 28.2*  PLT 318 306    BMET  Recent Labs  11/23/13 1045 11/24/13 0550  NA 137 136*  K 3.6* 3.8  CL 96 98  CO2 25 21  GLUCOSE 96 101*  BUN 6 8  CREATININE 2.04* 2.15*  CALCIUM 9.0 8.6    Micro Results: Recent Results (from the past 240 hour(s))  CULTURE, BLOOD (ROUTINE X 2)     Status: None   Collection Time    11/23/13 10:24 AM      Result Value Ref Range Status   Specimen Description BLOOD RIGHT ARM   Final   Special Requests BOTTLES DRAWN AEROBIC AND ANAEROBIC 5CC  Final   Culture  Setup Time     Final   Value: 11/23/2013 18:28     Performed at [facility]   Culture     Final   Value:        BLOOD CULTURE RECEIVED NO GROWTH TO DATE CULTURE WILL BE HELD FOR 5 DAYS BEFORE ISSUING A FINAL NEGATIVE REPORT     Performed at [facility]   Report Status PENDING   Incomplete  CULTURE, BLOOD (ROUTINE X 2)     Status: None   Collection Time    11/23/13 10:29 AM      Result Value Ref Range Status   Specimen Description BLOOD RIGHT ARM   Final   Special Requests BOTTLES DRAWN AEROBIC AND ANAEROBIC 10CC   Final   Culture  Setup Time     Final   Value: 11/23/2013 18:28     Performed at [facility]   Culture     Final   Value:        BLOOD CULTURE RECEIVED NO GROWTH TO DATE CULTURE WILL BE HELD FOR 5 DAYS BEFORE ISSUING A FINAL NEGATIVE REPORT     Performed at [facility]   Report Status PENDING   Incomplete    Studies/Results: Ct  Abdomen Pelvis Wo Contrast  11/23/2013   CLINICAL DATA:  One week history of umbilical pain, cramping and nausea.  EXAM: CT ABDOMEN AND PELVIS WITHOUT CONTRAST  TECHNIQUE: Multidetector CT imaging of the abdomen and pelvis was performed following the standard protocol without intravenous contrast.  COMPARISON:  Most recent prior CT abdomen/ pelvis 09/06/2013  FINDINGS: Lower Chest: Small bilateral layering pleural effusions. Mild dependent atelectasis in the right lower lobe. Significant atelectasis and possible consolidation in the visualized left lower lobe. Unchanged mild cardiomegaly. No pericardial effusion. Unremarkable distal thoracic esophagus.  Abdomen: Unenhanced CT was performed per clinician order. Lack of IV contrast limits sensitivity and specificity, especially for evaluation of abdominal/pelvic solid viscera. Within these limitations, unremarkable CT appearance of the stomach, duodenum, spleen, adrenal glands and pancreas. Normal hepatic contour morphology. Dystrophic calcification in the posterior right hemi liver is unchanged compared to prior. No discrete hepatic lesion. Geographic hypoattenuation in the left hemi-liver adjacent to the fissure for the falciform ligament is nonspecific but most suggestive of benign focal fatty infiltration. Gallbladder is unremarkable. No intra or extrahepatic biliary ductal dilatation.  No hydronephrosis. 3 mm nonobstructing stone in the lower pole of the right kidney. High attenuation foci within the left kidney likely represent small hemorrhagic cysts.  No evidence of obstruction or focal bowel wall thickening. Normal appendix in the right lower quadrant. The terminal ileum is unremarkable.  Pelvis: Small amount of intermediate attenuation ascites in the right lateral inguinal recess and with in the pelvis. Unremarkable appearance of the seminal vesicles. Mild prostatomegaly. Normal appearance of the bladder. Right indirect inguinal hernia containing omental  fat and a small amount of pelvic ascites. Interval development of right inguinal masses compared to the prior study. The largest node measures 1.7 x 3.2 cm. There is associated overlying skin thickening. A second superficial nodular opacity measures 1.7 cm in diameter. There are new surgical clips overlying the right common femoral vessels immediately subjacent to the nodular opacity.  Bones/Soft Tissues: No acute fracture or aggressive appearing lytic or blastic osseous lesion.  Vascular: Atherosclerotic vascular disease without significant stenosis or aneurysmal dilatation.  IMPRESSION: 1. Small amount of intermediate attenuation ascites in the right lateral inguinal recess and  deep anatomic pelvis of on certain etiology. No evidence of bowel obstruction or focal bowel wall thickening. And occult infectious/inflammatory enteritis or colitis is a consideration. 2. Right direct inguinal hernia containing omental fat and a small amount of ascitic fluid. 3. Interval development of nodular masslike densities in the right superficial inguinal region overlying new surgical clips an the common femoral vessels. Query clinical history of recent common femoral endarterectomy or attempted femoral loop graft placement? Given the presence of the surgical clips, the collection is favored to reflect a postoperative seroma or lymphocele. In the appropriate clinical setting, abscess, hematoma or lymphadenopathy are additional possibilities. 4. Left lower lobe atelectasis and possible consolidation. Left lower lobe pneumonia difficult to exclude. 5. Small bilateral layering pleural effusions. 6. Punctate nonobstructing stone in the lower pole of the right kidney. 7. Atherosclerotic vascular calcifications.   Electronically Signed   By: Jacqulynn Cadet M.D.   On: 11/23/2013 13:39   Dg Chest 2 View  11/23/2013   CLINICAL DATA:  Mid chest pain and shortness of breath. History of open-heart surgery 4 weeks ago.  EXAM: CHEST  2 VIEW   COMPARISON:  11/07/2013  FINDINGS: Left jugular catheter has been removed. Right jugular dual-lumen central venous catheter remains in place with tips overlying the right atrium, unchanged. The cardiac silhouette remains enlarged. There is improved aeration of the left lower lobe. There is a small left pleural effusion, decreased from prior. No definite right pleural effusion is identified. There is mild pulmonary vascular congestion without overt edema. No pneumothorax is seen. No acute osseous abnormality is identified.  IMPRESSION: 1. Cardiomegaly with pulmonary vascular congestion. 2. Small left pleural effusion, decreased from prior. 3. Improved aeration of the left lower lobe. 4. Interval removal of left jugular catheter.   Electronically Signed   By: Logan Bores   On: 11/23/2013 11:58   Ct Head Wo Contrast  11/23/2013   CLINICAL DATA:  Generalized weakness  EXAM: CT HEAD WITHOUT CONTRAST  TECHNIQUE: Contiguous axial images were obtained from the base of the skull through the vertex without intravenous contrast.  COMPARISON:  Prior head CT 11/07/2013  FINDINGS: Negative for acute intracranial hemorrhage, acute infarction, mass, mass effect, hydrocephalus or midline shift. Gray-white differentiation is preserved throughout. Stable focal hypoattenuation in the right centrum semiovale and left parietal subcortical and deep white matter. Globes and orbits are symmetric and unremarkable bilaterally. No focal soft tissue or calvarial abnormality. Normal aeration of the mastoid air cells and paranasal sinuses. .  IMPRESSION: 1. No acute intracranial abnormality. 2. No significant interval change in the appearance of the brain compared to recent prior imaging from 11/07/2013.   Electronically Signed   By: Jacqulynn Cadet M.D.   On: 11/23/2013 13:42      Assessment/Plan: Bradley [name] is a 58 y.o. male with  MSSA prosthetic valve endocarditis with septic emboli to brain sp 6 weeks of naf/rif-->  vanco/rifampin  now with paravalvular abscess BENTALL PROCEDURE With HOMOGRAFT, and debridement of root abscess by CT surgery  #1 nausea abdominal pain: Certainly could be do to rifampin I agree with stopping it for now and see how he does off of it.   #2  Prosthetic valve endocarditis with paravalvular abascess and 3rd open heart surgery redo bentall and I and D of aortic root abscess.  --continue ANCEF 2 G with HD and FOR TOTAL OF  X 8  Weeks post OP--> June 3rd as the last day of Ancef  I am ok with  observing off of rifampin  #3 ? Thrush on tongue: try nystatin swish and swallow  #4 Groin : will need to watch this site   #5 Severe protein allergy malnutrition: Not eating as much probably do to rifampin. His abdomen and also maybe depressed due to his infection. Hopefull  Appetite will improve off rifampin.         LOS: 1 day   Truman Hayward 11/24/2013, 3:11 PM

## 2013-11-24 NOTE — Progress Notes (Signed)
INITIAL NUTRITION ASSESSMENT  DOCUMENTATION CODES Per approved criteria  -Severe malnutrition in the context of chronic illness   INTERVENTION: Continue Nepro Shake po BID, each supplement provides 425 kcal and 19 grams protein. Recommend renal vitamin. RD to continue to follow nutrition care plan.  NUTRITION DIAGNOSIS: Inadequate oral intake related to poor appetite AEB patient report and weight loss.  Goal: Intake to meet >90% of estimated nutrition needs.  Monitor:  Weights, labs, intake, supplement tolerance   Reason for Assessment: Malnutrition Screening Tool  58 y.o. male  Admitting Dx: abdominal pain  ASSESSMENT: PMHx significant for aortic stenosis, chronic LBBB, thyroid CA post thyroidectomy, ESRD (on HD), HTN, and depression. Recently discharged after 2 hospitalizations - 3/13 after a one month stay for sepsis, MSSA bacteremia septic brain emboli and presumptive prosthetic valve infective endocarditis. Admitted with septic arthritis and sternal infection/osteomyelitis seeding from his previous bacteremia. New start to HD as of 2/21. Pt developed abdominal pain 1.5 weeks ago. Work-up ongoing.  Currently ordered for Renal diet, consuming 50% of meals. Also ordered for Nepro Shake PO BID. Pt states that during the past 2 weeks, he has had poor appetite with his abdominal pain. During that time, he was only able to consume up to 2 Nepro's daily, no food. Pt reports that he is waiting for his breakfast tray, however RN reports that pt refused his breakfast tray.  Nutrition Focused Physical Exam:  Subcutaneous Fat:  Orbital Region: WNL Upper Arm Region: moderate depletion Thoracic and Lumbar Region: moderate depletion  Muscle:  Temple Region: WNL Clavicle Bone Region: moderate depletion Clavicle and Acromion Bone Region: n/a Scapular Bone Region: n/a Dorsal Hand: n/a Patellar Region: WNL Anterior Thigh Region: moderate depletion Posterior Calf Region: moderate  depletion  Edema: none  Pt meets criteria for severe MALNUTRITION in the context of chronic illness as evidenced by intake of <75% x at least 1 month, 7% wt loss x 2 months.  Sodium is low at 136 Potassium WNL Phosphorus elevated at 4.9  Height: Ht Readings from Last 1 Encounters:  11/23/13 6\' 1"  (1.854 m)    Weight: Wt Readings from Last 1 Encounters:  11/23/13 187 lb 13.3 oz (85.2 kg)    Ideal Body Weight: 184 lb   % Ideal Body Weight: 102%  Wt Readings from Last 10 Encounters:  11/23/13 187 lb 13.3 oz (85.2 kg)  11/13/13 183 lb 13.8 oz (83.4 kg)  11/13/13 183 lb 13.8 oz (83.4 kg)  11/13/13 183 lb 13.8 oz (83.4 kg)  10/22/13 192 lb (87.091 kg)  10/10/13 189 lb 6 oz (85.9 kg)  10/10/13 189 lb 6 oz (85.9 kg)  09/06/13 200 lb (90.719 kg)  05/07/13 208 lb (94.348 kg)  05/06/13 204 lb 6.4 oz (92.715 kg)    Usual Body Weight: 200 lb   % Usual Body Weight: 93%  BMI:  Body mass index is 24.79 kg/(m^2). WNL  Estimated Nutritional Needs: Kcal: 2200-2400 Protein: 115-130 g Fluid: 1.2 L/day  Skin:  L arm incision Chest incision R groin incision  Diet Order: Renal w 1200 ml Fluid Restriction  EDUCATION NEEDS: -No education needs identified at this time   Intake/Output Summary (Last 24 hours) at 11/24/13 1007 Last data filed at 11/24/13 0900  Gross per 24 hour  Intake    240 ml  Output      0 ml  Net    240 ml    Last BM: 4/26  Labs:   Recent Labs Lab 11/23/13 1045 11/24/13 0550  NA 137 136*  K 3.6* 3.8  CL 96 98  CO2 25 21  BUN 6 8  CREATININE 2.04* 2.15*  CALCIUM 9.0 8.6  GLUCOSE 96 101*    CBG (last 3)  No results found for this basename: GLUCAP,  in the last 72 hours  Scheduled Meds: . amiodarone  200 mg Oral Daily  . aspirin EC  81 mg Oral Daily  . [START ON 11/25/2013] ceFAZolin  2 g Intravenous Q T,Th,Sa-HD  . feeding supplement (NEPRO CARB STEADY)  237 mL Oral BID BM  . ferrous [id number] C-folic acid  1 capsule Oral  Daily  . heparin  5,000 Units Subcutaneous 3 times per day  . levothyroxine  200 mcg Oral QAC breakfast  . midodrine  5 mg Oral BID WC  . mirtazapine  15 mg Oral QHS  . pantoprazole  40 mg Oral BID AC  . sertraline  50 mg Oral Daily  . sevelamer carbonate  800-1,600 mg Oral TID WC  . simethicone  80 mg Oral QID  . sodium chloride  3 mL Intravenous Q12H    Continuous Infusions: none    Past Medical History  Diagnosis Date  . Anxiety   . Depression   . Hypertension   . Hypothyroidism, postsurgical   . Aortic stenosis     s/p [place] with bioprosthetic AVR 02/2010; Last echo (9/11): Moderate LVH, EF 45-50%, AVR functioning appropriately, aortic valve mean gradient 21, diastolic dysfunction. Chest MRA (2/13): Mild to moderate dilatation at the sinus of Valsalva at 4.1 cm, mild dilatation ascending aorta distal to the tube graft at 3.9 cm, moderate dilatation of the innominate artery a 2.1 cm;    . Hx of cardiac cath     a. LHC in 02/2010: normal cors  . Hx of echocardiogram 2014    Echo (10/14): Severe LVH, EF 60-65%, normal wall motion, grade 1 diastolic dysfunction, AVR functioning normally, mild aortic stenosis (mean 19), moderately dilated aorta, mild LAE, mild RVE  . Staphylococcus aureus bacteremia   . Prosthetic valve endocarditis   . Thyroid cancer   . Heart murmur   . ESRD on dialysis     "[place]; TTS" (10/22/2013)    Past Surgical History  Procedure Laterality Date  . Cardiac catheterization  03/02/2010    NORMAL CORONARY ARTERY  . Sternotomy      REDO  . Transthoracic echocardiogram  03/2010    SHOWED MILD REDUCTION OF LV FUNCTION  . Thyroidectomy  ~ 2005  . Shoulder arthroscopy w/ rotator cuff repair Right 2012  . Av fistula placement Left 10/08/2013    Procedure: ARTERIOVENOUS (AV) FISTULA CREATION- LEFT ARM; Radial Cephalic ;  Surgeon: Mal [name], MD;  Location: [facility];  Service: Vascular;  Laterality: Left;  . Aortic valve replacement  2011  . Cardiac  valve replacement    . Aortic valve repair  1968  . Tee without cardioversion N/A 10/24/2013    Procedure: TRANSESOPHAGEAL ECHOCARDIOGRAM (TEE);  Surgeon: Dorothy Spark, MD;  Location: [facility] ENDOSCOPY;  Service: Cardiovascular;  Laterality: N/A;  Deneen Harts procedure N/A 10/28/2013    Procedure: REDO BENTALL PROCEDURE, debridment of aoritc root abscess, replacement of aortic root, ascending aorta and aortic valve with homograft. Insertion of left femoral arterial line;  Surgeon: [name], MD;  Location: [facility];  Service: Open Heart Surgery;  Laterality: N/A;  . Intraoperative transesophageal echocardiogram N/A 10/28/2013    Procedure: INTRAOPERATIVE TRANSESOPHAGEAL ECHOCARDIOGRAM;  Surgeon: [name], MD;  Location: [facility] OR;  Service: Open Heart Surgery;  Laterality: N/A;    Inda Coke MS, RD, LDN Inpatient Registered Dietitian Pager: [phone] After-hours pager: [phone]

## 2013-11-24 NOTE — Clinical Documentation Improvement (Signed)
  Per eval by Registered Dietician 4/27:  "Pt meets criteria for Severe Protein Calorie Malnutrtion in the context of chronic illness as evidenced by intake of <75% x at least 1 month, 7% wt loss x 2 months."  If you agree, please add to documentation to reflect severity of illness and risk of mortality. Thank you.  Possible Clinical Conditions? - Severe Protein Calorie Malnutrition - Other Condition  Thank You, Ezekiel Ina ,RN Clinical Documentation Specialist:  [phone]  [place] Information Management

## 2013-11-24 NOTE — Consult Note (Signed)
Reason for Consult: Chest Pain Referring Physician:   [name] is an 58 y.o. male.  HPI:   The patient is a 58 yo male with a history of redo Bentall replacement of aortic valve, aortic root, and ascending aorta with reimplantation of coronary arteries using a homograft with debridement of aortic valve abscess on 10/28/13 by [name].  He had a complicated and prolonged hospitalization from 10/22/13 to 11/09/13.  During that time he had been place on Amio for VT/Vfib but was weaned off.  He had normal coronaries in 02/2010.  He had a cardiac Ct on 10/26/13 which revealed normal coronaries.  Last echo was 09/09/13 and EF was 45-50% with moderate LVH, mild MR, LA moderately dilated.  His history also includes ESRD, HTN, hypothyroidism, thyroid cancer.  We are asked to see for troponin elevation/ CP.  Troponin so far is 0.74.  He reports no CP and came in because of ABD pain since Wednesday.  It is worse after eating and associated with nausea and dry heaves.  He has had some DOE which is worse with the abd pain.  The patient currently denies fever, chest pain, shortness of breath, orthopnea, dizziness, PND, cough, congestion, hematochezia, melena, lower extremity edema, claudication.    Past Medical History  Diagnosis Date  . Anxiety   . Depression   . Hypertension   . Hypothyroidism, postsurgical   . Aortic stenosis     s/p Bentall with bioprosthetic AVR 02/2010; Last echo (9/11): Moderate LVH, EF 45-50%, AVR functioning appropriately, aortic valve mean gradient 21, diastolic dysfunction. Chest MRA (2/13): Mild to moderate dilatation at the sinus of Valsalva at 4.1 cm, mild dilatation ascending aorta distal to the tube graft at 3.9 cm, moderate dilatation of the innominate artery a 2.1 cm;    . Hx of cardiac cath     a. LHC in 02/2010: normal cors  . Hx of echocardiogram 2014    Echo (10/14): Severe LVH, EF 60-65%, normal wall motion, grade 1 diastolic dysfunction, AVR functioning  normally, mild aortic stenosis (mean 19), moderately dilated aorta, mild LAE, mild RVE  . Staphylococcus aureus bacteremia   . Prosthetic valve endocarditis   . Thyroid cancer   . Heart murmur   . ESRD on dialysis     "Bradley [name]; TTS" (10/22/2013)    Past Surgical History  Procedure Laterality Date  . Cardiac catheterization  03/02/2010    NORMAL CORONARY ARTERY  . Sternotomy      REDO  . Transthoracic echocardiogram  03/2010    SHOWED MILD REDUCTION OF LV FUNCTION  . Thyroidectomy  ~ 2005  . Shoulder arthroscopy w/ rotator cuff repair Right 2012  . Av fistula placement Left 10/08/2013    Procedure: ARTERIOVENOUS (AV) FISTULA CREATION- LEFT ARM; Radial Cephalic ;  Surgeon: Mal [name], MD;  Location: [place];  Service: Vascular;  Laterality: Left;  . Aortic valve replacement  2011  . Cardiac valve replacement    . Aortic valve repair  1968  . Tee without cardioversion N/A 10/24/2013    Procedure: TRANSESOPHAGEAL ECHOCARDIOGRAM (TEE);  Surgeon: Dorothy Spark, MD;  Location: [facility];  Service: Cardiovascular;  Laterality: N/A;  Deneen Harts procedure N/A 10/28/2013    Procedure: REDO BENTALL PROCEDURE, debridment of aoritc root abscess, replacement of aortic root, ascending aorta and aortic valve with homograft. Insertion of left femoral arterial line;  Surgeon: [name], MD;  Location: [place];  Service: Open Heart Surgery;  Laterality: N/A;  . Intraoperative transesophageal echocardiogram N/A 10/28/2013    Procedure: INTRAOPERATIVE TRANSESOPHAGEAL ECHOCARDIOGRAM;  Surgeon: [name], MD;  Location: [place];  Service: Open Heart Surgery;  Laterality: N/A;    Family History  Problem Relation Age of Onset  . Heart disease Father   . Thyroid cancer Sister   . Thyroid cancer Sister   . Kidney Stones Sister     Social History:  reports that he has never smoked. He has never used smokeless tobacco. He reports that he does not drink alcohol or use illicit  drugs.  Allergies:  Allergies  Allergen Reactions  . Oxycodone     Gives patient nightmares  . Rifampin Nausea Only    Medications:  Scheduled Meds: . amiodarone  200 mg Oral Daily  . aspirin EC  81 mg Oral Daily  . [START ON 11/25/2013] ceFAZolin  2 g Intravenous Q T,Th,Sa-HD  . feeding supplement (NEPRO CARB STEADY)  237 mL Oral BID BM  . heparin  5,000 Units Subcutaneous 3 times per day  . levothyroxine  200 mcg Oral QAC breakfast  . midodrine  5 mg Oral BID WC  . mirtazapine  15 mg Oral QHS  . multivitamin  1 tablet Oral QHS  . nystatin  5 mL Oral QID  . pantoprazole  40 mg Oral BID AC  . sertraline  50 mg Oral Daily  . sevelamer carbonate  800-1,600 mg Oral TID WC  . simethicone  80 mg Oral QID  . sodium chloride  3 mL Intravenous Q12H   Continuous Infusions:  PRN Meds:.acetaminophen, acetaminophen, clonazePAM, ondansetron (ZOFRAN) IV, ondansetron   Results for orders placed during the hospital encounter of 11/23/13 (from the past 48 hour(s))  CULTURE, BLOOD (ROUTINE X 2)     Status: None   Collection Time    11/23/13 10:24 AM      Result Value Ref Range   Specimen Description BLOOD RIGHT ARM     Special Requests BOTTLES DRAWN AEROBIC AND ANAEROBIC 5CC     Culture  Setup Time       Value: 11/23/2013 18:28     Performed at [facility]   Culture       Value:        BLOOD CULTURE RECEIVED NO GROWTH TO DATE CULTURE WILL BE HELD FOR 5 DAYS BEFORE ISSUING A FINAL NEGATIVE REPORT     Performed at [facility]   Report Status PENDING    CULTURE, BLOOD (ROUTINE X 2)     Status: None   Collection Time    11/23/13 10:29 AM      Result Value Ref Range   Specimen Description BLOOD RIGHT ARM     Special Requests BOTTLES DRAWN AEROBIC AND ANAEROBIC 10CC     Culture  Setup Time       Value: 11/23/2013 18:28     Performed at [facility]   Culture       Value:        BLOOD CULTURE RECEIVED NO GROWTH TO DATE CULTURE WILL BE HELD FOR 5 DAYS BEFORE  ISSUING A FINAL NEGATIVE REPORT     Performed at [facility]   Report Status PENDING    CBC WITH DIFFERENTIAL     Status: Abnormal   Collection Time    11/23/13 10:45 AM      Result Value Ref Range   WBC 6.0  4.0 - 10.5 K/uL   RBC 3.46 (*) 4.22 -  5.81 MIL/uL   Hemoglobin 10.5 (*) 13.0 - 17.0 g/dL   HCT 34.1 (*) 39.0 - 52.0 %   MCV 98.6  78.0 - 100.0 fL   MCH 30.3  26.0 - 34.0 pg   MCHC 30.8  30.0 - 36.0 g/dL   RDW 23.9 (*) 11.5 - 15.5 %   Platelets 318  150 - 400 K/uL   Neutrophils Relative % 65  43 - 77 %   Neutro Abs 3.9  1.7 - 7.7 K/uL   Lymphocytes Relative 11 (*) 12 - 46 %   Lymphs Abs 0.7  0.7 - 4.0 K/uL   Monocytes Relative 17 (*) 3 - 12 %   Monocytes Absolute 1.0  0.1 - 1.0 K/uL   Eosinophils Relative 5  0 - 5 %   Eosinophils Absolute 0.3  0.0 - 0.7 K/uL   Basophils Relative 2 (*) 0 - 1 %   Basophils Absolute 0.1  0.0 - 0.1 K/uL  COMPREHENSIVE METABOLIC PANEL     Status: Abnormal   Collection Time    11/23/13 10:45 AM      Result Value Ref Range   Sodium 137  137 - 147 mEq/L   Potassium 3.6 (*) 3.7 - 5.3 mEq/L   Chloride 96  96 - 112 mEq/L   CO2 25  19 - 32 mEq/L   Glucose, Bld 96  70 - 99 mg/dL   BUN 6  6 - 23 mg/dL   Creatinine, Ser 2.04 (*) 0.50 - 1.35 mg/dL   Calcium 9.0  8.4 - 10.5 mg/dL   Total Protein 7.6  6.0 - 8.3 g/dL   Albumin 2.2 (*) 3.5 - 5.2 g/dL   AST 23  0 - 37 U/L   ALT 9  0 - 53 U/L   Alkaline Phosphatase 88  39 - 117 U/L   Total Bilirubin 0.4  0.3 - 1.2 mg/dL   GFR calc non Af Amer 35 (*) >90 mL/min   GFR calc Af Amer 40 (*) >90 mL/min   Comment: (NOTE)     The eGFR has been calculated using the CKD EPI equation.     This calculation has not been validated in all clinical situations.     eGFR's persistently <90 mL/min signify possible Chronic Kidney     Disease.  AMMONIA     Status: None   Collection Time    11/23/13 10:45 AM      Result Value Ref Range   Ammonia 20  11 - 60 umol/L  I-STAT CG4 LACTIC ACID, ED     Status: None    Collection Time    11/23/13 11:06 AM      Result Value Ref Range   Lactic Acid, Venous 1.73  0.5 - 2.2 mmol/L  URINALYSIS, ROUTINE W REFLEX MICROSCOPIC     Status: Abnormal   Collection Time    11/23/13  1:39 PM      Result Value Ref Range   Color, Urine AMBER (*) YELLOW   Comment: BIOCHEMICALS MAY BE AFFECTED BY COLOR   APPearance CLOUDY (*) CLEAR   Specific Gravity, Urine 1.018  1.005 - 1.030   pH 8.0  5.0 - 8.0   Glucose, UA NEGATIVE  NEGATIVE mg/dL   Hgb urine dipstick MODERATE (*) NEGATIVE   Bilirubin Urine NEGATIVE  NEGATIVE   Ketones, ur NEGATIVE  NEGATIVE mg/dL   Protein, ur 100 (*) NEGATIVE mg/dL   Urobilinogen, UA 0.2  0.0 - 1.0 mg/dL   Nitrite NEGATIVE  NEGATIVE   Leukocytes, UA NEGATIVE  NEGATIVE  URINE MICROSCOPIC-ADD ON     Status: None   Collection Time    11/23/13  1:39 PM      Result Value Ref Range   Squamous Epithelial / LPF RARE  RARE   WBC, UA 0-2  <3 WBC/hpf   RBC / HPF 3-6  <3 RBC/hpf  POC OCCULT BLOOD, ED     Status: None   Collection Time    11/23/13  2:33 PM      Result Value Ref Range   Fecal Occult Bld NEGATIVE  NEGATIVE  TROPONIN I     Status: Abnormal   Collection Time    11/23/13  4:14 PM      Result Value Ref Range   Troponin I 0.67 (*) <0.30 ng/mL   Comment:            Due to the release kinetics of cTnI,     a negative result within the first hours     of the onset of symptoms does not rule out     myocardial infarction with certainty.     If myocardial infarction is still suspected,     repeat the test at appropriate intervals.     CRITICAL RESULT CALLED TO, READ BACK BY AND VERIFIED WITH:     RN Nance Pear [phone] Eliane Decree.  TROPONIN I     Status: Abnormal   Collection Time    11/23/13 10:22 PM      Result Value Ref Range   Troponin I 0.65 (*) <0.30 ng/mL   Comment:            Due to the release kinetics of cTnI,     a negative result within the first hours     of the onset of symptoms does not rule out     myocardial  infarction with certainty.     If myocardial infarction is still suspected,     repeat the test at appropriate intervals.     CRITICAL VALUE NOTED.  VALUE IS CONSISTENT WITH PREVIOUSLY REPORTED AND CALLED VALUE.  TROPONIN I     Status: Abnormal   Collection Time    11/24/13  5:50 AM      Result Value Ref Range   Troponin I 0.74 (*) <0.30 ng/mL   Comment:            Due to the release kinetics of cTnI,     a negative result within the first hours     of the onset of symptoms does not rule out     myocardial infarction with certainty.     If myocardial infarction is still suspected,     repeat the test at appropriate intervals.     CRITICAL VALUE NOTED.  VALUE IS CONSISTENT WITH PREVIOUSLY REPORTED AND CALLED VALUE.  CBC     Status: Abnormal   Collection Time    11/24/13  5:50 AM      Result Value Ref Range   WBC 9.0  4.0 - 10.5 K/uL   RBC 2.85 (*) 4.22 - 5.81 MIL/uL   Hemoglobin 8.8 (*) 13.0 - 17.0 g/dL   HCT 28.2 (*) 39.0 - 52.0 %   MCV 98.9  78.0 - 100.0 fL   MCH 30.9  26.0 - 34.0 pg   MCHC 31.2  30.0 - 36.0 g/dL   RDW 23.4 (*) 11.5 - 15.5 %   Platelets 306  150 - 400  K/uL  COMPREHENSIVE METABOLIC PANEL     Status: Abnormal   Collection Time    11/24/13  5:50 AM      Result Value Ref Range   Sodium 136 (*) 137 - 147 mEq/L   Potassium 3.8  3.7 - 5.3 mEq/L   Chloride 98  96 - 112 mEq/L   CO2 21  19 - 32 mEq/L   Glucose, Bld 101 (*) 70 - 99 mg/dL   BUN 8  6 - 23 mg/dL   Creatinine, Ser 2.15 (*) 0.50 - 1.35 mg/dL   Calcium 8.6  8.4 - 10.5 mg/dL   Total Protein 6.7  6.0 - 8.3 g/dL   Albumin 1.9 (*) 3.5 - 5.2 g/dL   AST 25  0 - 37 U/L   ALT 8  0 - 53 U/L   Alkaline Phosphatase 77  39 - 117 U/L   Total Bilirubin 0.3  0.3 - 1.2 mg/dL   GFR calc non Af Amer 33 (*) >90 mL/min   GFR calc Af Amer 38 (*) >90 mL/min   Comment: (NOTE)     The eGFR has been calculated using the CKD EPI equation.     This calculation has not been validated in all clinical situations.     eGFR's  persistently <90 mL/min signify possible Chronic Kidney     Disease.  GENTAMICIN LEVEL, RANDOM     Status: None   Collection Time    11/24/13  5:50 AM      Result Value Ref Range   Gentamicin Rm 0.5     Comment: REPEATED TO VERIFY     Performed at [facility] Abdomen Pelvis Wo Contrast  11/23/2013   CLINICAL DATA:  One week history of umbilical pain, cramping and nausea.  EXAM: CT ABDOMEN AND PELVIS WITHOUT CONTRAST  TECHNIQUE: Multidetector CT imaging of the abdomen and pelvis was performed following the standard protocol without intravenous contrast.  COMPARISON:  Most recent prior CT abdomen/ pelvis 09/06/2013  FINDINGS: Lower Chest: Small bilateral layering pleural effusions. Mild dependent atelectasis in the right lower lobe. Significant atelectasis and possible consolidation in the visualized left lower lobe. Unchanged mild cardiomegaly. No pericardial effusion. Unremarkable distal thoracic esophagus.  Abdomen: Unenhanced CT was performed per clinician order. Lack of IV contrast limits sensitivity and specificity, especially for evaluation of abdominal/pelvic solid viscera. Within these limitations, unremarkable CT appearance of the stomach, duodenum, spleen, adrenal glands and pancreas. Normal hepatic contour morphology. Dystrophic calcification in the posterior right hemi liver is unchanged compared to prior. No discrete hepatic lesion. Geographic hypoattenuation in the left hemi-liver adjacent to the fissure for the falciform ligament is nonspecific but most suggestive of benign focal fatty infiltration. Gallbladder is unremarkable. No intra or extrahepatic biliary ductal dilatation.  No hydronephrosis. 3 mm nonobstructing stone in the lower pole of the right kidney. High attenuation foci within the left kidney likely represent small hemorrhagic cysts.  No evidence of obstruction or focal bowel wall thickening. Normal appendix in the right lower quadrant. The terminal  ileum is unremarkable.  Pelvis: Small amount of intermediate attenuation ascites in the right lateral inguinal recess and with in the pelvis. Unremarkable appearance of the seminal vesicles. Mild prostatomegaly. Normal appearance of the bladder. Right indirect inguinal hernia containing omental fat and a small amount of pelvic ascites. Interval development of right inguinal masses compared to the prior study. The largest node measures 1.7 x 3.2 cm. There is associated overlying skin thickening.  A second superficial nodular opacity measures 1.7 cm in diameter. There are new surgical clips overlying the right common femoral vessels immediately subjacent to the nodular opacity.  Bones/Soft Tissues: No acute fracture or aggressive appearing lytic or blastic osseous lesion.  Vascular: Atherosclerotic vascular disease without significant stenosis or aneurysmal dilatation.  IMPRESSION: 1. Small amount of intermediate attenuation ascites in the right lateral inguinal recess and deep anatomic pelvis of on certain etiology. No evidence of bowel obstruction or focal bowel wall thickening. And occult infectious/inflammatory enteritis or colitis is a consideration. 2. Right direct inguinal hernia containing omental fat and a small amount of ascitic fluid. 3. Interval development of nodular masslike densities in the right superficial inguinal region overlying new surgical clips an the common femoral vessels. Query clinical history of recent common femoral endarterectomy or attempted femoral loop graft placement? Given the presence of the surgical clips, the collection is favored to reflect a postoperative seroma or lymphocele. In the appropriate clinical setting, abscess, hematoma or lymphadenopathy are additional possibilities. 4. Left lower lobe atelectasis and possible consolidation. Left lower lobe pneumonia difficult to exclude. 5. Small bilateral layering pleural effusions. 6. Punctate nonobstructing stone in the lower pole  of the right kidney. 7. Atherosclerotic vascular calcifications.   Electronically Signed   By: Jacqulynn Cadet M.D.   On: 11/23/2013 13:39   Dg Chest 2 View  11/23/2013   CLINICAL DATA:  Mid chest pain and shortness of breath. History of open-heart surgery 4 weeks ago.  EXAM: CHEST  2 VIEW  COMPARISON:  11/07/2013  FINDINGS: Left jugular catheter has been removed. Right jugular dual-lumen central venous catheter remains in place with tips overlying the right atrium, unchanged. The cardiac silhouette remains enlarged. There is improved aeration of the left lower lobe. There is a small left pleural effusion, decreased from prior. No definite right pleural effusion is identified. There is mild pulmonary vascular congestion without overt edema. No pneumothorax is seen. No acute osseous abnormality is identified.  IMPRESSION: 1. Cardiomegaly with pulmonary vascular congestion. 2. Small left pleural effusion, decreased from prior. 3. Improved aeration of the left lower lobe. 4. Interval removal of left jugular catheter.   Electronically Signed   By: Logan Bores   On: 11/23/2013 11:58   Ct Head Wo Contrast  11/23/2013   CLINICAL DATA:  Generalized weakness  EXAM: CT HEAD WITHOUT CONTRAST  TECHNIQUE: Contiguous axial images were obtained from the base of the skull through the vertex without intravenous contrast.  COMPARISON:  Prior head CT 11/07/2013  FINDINGS: Negative for acute intracranial hemorrhage, acute infarction, mass, mass effect, hydrocephalus or midline shift. Gray-white differentiation is preserved throughout. Stable focal hypoattenuation in the right centrum semiovale and left parietal subcortical and deep white matter. Globes and orbits are symmetric and unremarkable bilaterally. No focal soft tissue or calvarial abnormality. Normal aeration of the mastoid air cells and paranasal sinuses. .  IMPRESSION: 1. No acute intracranial abnormality. 2. No significant interval change in the appearance of the  brain compared to recent prior imaging from 11/07/2013.   Electronically Signed   By: Jacqulynn Cadet M.D.   On: 11/23/2013 13:42    Review of Systems  Constitutional: Negative for fever and diaphoresis.  HENT: Negative for congestion and sore throat.   Eyes: Positive for discharge.  Respiratory: Positive for shortness of breath (With extertion). Negative for cough.   Cardiovascular: Negative for chest pain, palpitations, orthopnea, leg swelling and PND.  Gastrointestinal: Positive for nausea, vomiting and abdominal pain. Negative  for diarrhea, constipation, blood in stool and melena.  Genitourinary: Negative for hematuria.  Musculoskeletal: Negative for back pain, myalgias and neck pain.  Neurological: Negative for dizziness.  All other systems reviewed and are negative.  Blood pressure 139/93, pulse 74, temperature 99.5 F (37.5 C), temperature source Oral, resp. rate 18, height '6\' 1"'  (1.854 m), weight 187 lb 13.3 oz (85.2 kg), SpO2 100.00%. Physical Exam  Nursing note and vitals reviewed. Constitutional: He is oriented to person, place, and time. He appears well-developed and well-nourished. No distress.  HENT:  Head: Normocephalic and atraumatic.  Mouth/Throat: Oropharynx is clear and moist. No oropharyngeal exudate.  Eyes: EOM are normal. Pupils are equal, round, and reactive to light. No scleral icterus.  Neck: Normal range of motion. Neck supple. No JVD present.  Cardiovascular: Regular rhythm, S1 normal and S2 normal.  Tachycardia present.   No murmur heard. Pulses:      Radial pulses are 2+ on the right side, and 2+ on the left side.       Dorsalis pedis pulses are 2+ on the right side, and 2+ on the left side.  No carotid bruit  Respiratory: Effort normal.  Decreased BS inferiorly  GI: Soft. He exhibits no distension. There is tenderness (diffusely).  Bowel sounds are hyperactive  Musculoskeletal: He exhibits no edema.  Lymphadenopathy:    He has no cervical  adenopathy.  Neurological: He is alert and oriented to person, place, and time. He exhibits normal muscle tone.  Skin: Skin is dry.  Psychiatric: He has a normal mood and affect.    Assessment/Plan: Active Problems:   Troponin Elevation This is likely from demand ischemia in the setting of severe abd pain, tachycardia and anemia.  Hgb is 8.8.  The cardiac CT in march revealed normal coronary arteries.  He also had a brief run of SVT this morning which was in the 180+ range and has been consistently tachycardic.  Recommend adding a low dose lopressor 12.51m.  Continue to monitor troponin.   Abdominal pain   Severe protein-calorie malnutrition    BTarri Fuller4/27/2015, 3:07 PM   Patient seen and examined  I have revewed and amended note to reflect my findings.  Minimal troponin elevation in setting of ESRD/dialysis.  Most likely reflects demand ischemia in setting of anemia, tachycardia with decreased renal clearance. WOuld continue medical Rx    Note midodrine use.  BP has been adequate  WOuld consider trial of low dose b blocker to control HR.

## 2013-11-24 NOTE — Progress Notes (Signed)
Patient would not eat breakfast or lunch.  Encouraged patient to eat stating that he needed the nutrition but patient stated that he is not hungry and would only like Nepro.

## 2013-11-25 DIAGNOSIS — IMO0001 Reserved for inherently not codable concepts without codable children: Secondary | ICD-10-CM

## 2013-11-25 DIAGNOSIS — R509 Fever, unspecified: Secondary | ICD-10-CM

## 2013-11-25 DIAGNOSIS — R778 Other specified abnormalities of plasma proteins: Secondary | ICD-10-CM | POA: Diagnosis present

## 2013-11-25 DIAGNOSIS — Z0389 Encounter for observation for other suspected diseases and conditions ruled out: Secondary | ICD-10-CM

## 2013-11-25 DIAGNOSIS — R000 Tachycardia, unspecified: Secondary | ICD-10-CM

## 2013-11-25 DIAGNOSIS — R7989 Other specified abnormal findings of blood chemistry: Secondary | ICD-10-CM | POA: Diagnosis present

## 2013-11-25 LAB — BASIC METABOLIC PANEL
BUN: 13 mg/dL (ref 6–23)
CO2: 22 mEq/L (ref 19–32)
Calcium: 8.8 mg/dL (ref 8.4–10.5)
Chloride: 99 mEq/L (ref 96–112)
Creatinine, Ser: 2.45 mg/dL — ABNORMAL HIGH (ref 0.50–1.35)
GFR calc Af Amer: 32 mL/min — ABNORMAL LOW (ref >90)
GFR calc non Af Amer: 28 mL/min — ABNORMAL LOW (ref >90)
Glucose, Bld: 101 mg/dL — ABNORMAL HIGH (ref 70–99)
Potassium: 3.4 mEq/L — ABNORMAL LOW (ref 3.7–5.3)
Sodium: 137 mEq/L (ref 137–147)

## 2013-11-25 LAB — CBC
HCT: 28.4 % — ABNORMAL LOW (ref 39.0–52.0)
Hemoglobin: 8.8 g/dL — ABNORMAL LOW (ref 13.0–17.0)
MCH: 30.8 pg (ref 26.0–34.0)
MCHC: 31 g/dL (ref 30.0–36.0)
MCV: 99.3 fL (ref 78.0–100.0)
Platelets: 276 10*3/uL (ref 150–400)
RBC: 2.86 MIL/uL — ABNORMAL LOW (ref 4.22–5.81)
RDW: 23 % — ABNORMAL HIGH (ref 11.5–15.5)
WBC: 9.4 10*3/uL (ref 4.0–10.5)

## 2013-11-25 LAB — TROPONIN I
Troponin I: 0.54 ng/mL (ref <0.30)
Troponin I: 0.51 ng/mL (ref <0.30)

## 2013-11-25 MED ORDER — CEFAZOLIN SODIUM-DEXTROSE 2-3 GM-% IV SOLR
2.0000 g | Freq: Two times a day (BID) | INTRAVENOUS | Status: DC
  Administered 2013-11-25 – 2013-11-28 (×7): 2 g via INTRAVENOUS
  Filled 2013-11-25 (×8): qty 50

## 2013-11-25 MED ORDER — METOPROLOL TARTRATE 25 MG PO TABS
25.0000 mg | ORAL_TABLET | Freq: Two times a day (BID) | ORAL | Status: DC
  Administered 2013-11-25 – 2013-11-28 (×6): 25 mg via ORAL
  Filled 2013-11-25 (×7): qty 1

## 2013-11-25 NOTE — Progress Notes (Signed)
Note: This document was prepared with digital dictation and possible smart phrase technology. Any transcriptional errors that result from this process are unintentional.   BEREL FILLMORE [id number] DOB: 1955-11-22 DOA: 11/23/2013 PCP: [name], MD  Brief narrative: 58 y/o ? known ESRD patient (TTS HD) ,aortic valve replacement at age 53-developed Aortic aneurysm Bentall Procedure + Av prosthesis- in 08/2013 with MSSA bacteremia with AV endocarditis and septic emboli causing embolic strokes.  Re-admitted 10/18/13 c L STclavic pain-? Osteo- underwent 3rd Bentall [id number] course complicate dby VDRF, VT-noted normal coronaries 02/2010 and Cardiac Ct 10/26/13 normal coronaries.  currently on Ancef and Rifampin through 12/31/13 thyroid cancer s/p thyroidectomy .   Admitted to hospital 11/23/13 c ? abd discomfort, soft darks tools, some CP no fever.  No chills.  NOted on admit ? LL PNA vs atelectasis, ? Non obst stone R kideny-occult stoolw as neg   Past medical history-As per Problem list Chart reviewed as below- reviewed  Consultants:  Cardiology  Infectious disease  Nephrology Procedures:  None Antibiotics:  Cefazolin 2 gm T-T-S    Subjective  Doing fair.  C/o some abd discomfort.  Didn't eat breakfast. NO n/v Some soft stool CDIFF neg No blurred or double vision No overt fever or chills   Objective    Interim History: none  Telemetry: SInus   Objective: Filed Vitals:   11/24/13 1700 11/24/13 2057 11/25/13 0449 11/25/13 0824  BP: 126/87 131/83 116/79 134/83  Pulse: 111 104 104 100  Temp: 98.4 F (36.9 C) 98.1 F (36.7 C) 100.8 F (38.2 C) 99.6 F (37.6 C)  TempSrc: Oral Oral Oral Oral  Resp: 19 18 20 18   Height:      Weight:  84.3 kg (185 lb 13.6 oz)    SpO2: 94% 93% 92% 93%    Intake/Output Summary (Last 24 hours) at 11/25/13 1103 Last data filed at 11/24/13 1700  Gross per 24 hour  Intake    120 ml  Output    100 ml  Net     20 ml     Exam:  General: nad, eomi, tired appearing CM Cardiovascular: s1 s2 no m/r/g.  Wound to chest appears clean Respiratory: clear, no added sound Abdomen: soft, NT, ND Skin no le edema Neuro intact   Data Reviewed: Basic Metabolic Panel:  Recent Labs Lab 11/23/13 1045 11/24/13 0550 11/25/13 0747  NA 137 136* 137  K 3.6* 3.8 3.4*  CL 96 98 99  CO2 25 21 22   GLUCOSE 96 101* 101*  BUN 6 8 13   CREATININE 2.04* 2.15* 2.45*  CALCIUM 9.0 8.6 8.8   Liver Function Tests:  Recent Labs Lab 11/23/13 1045 11/24/13 0550  AST 23 25  ALT 9 8  ALKPHOS 88 77  BILITOT 0.4 0.3  PROT 7.6 6.7  ALBUMIN 2.2* 1.9*   No results found for this basename: LIPASE, AMYLASE,  in the last 168 hours  Recent Labs Lab 11/23/13 1045  AMMONIA 20   CBC:  Recent Labs Lab 11/23/13 1045 11/24/13 0550 11/25/13 0747  WBC 6.0 9.0 9.4  NEUTROABS 3.9  --   --   HGB 10.5* 8.8* 8.8*  HCT 34.1* 28.2* 28.4*  MCV 98.6 98.9 99.3  PLT 318 306 276   Cardiac Enzymes:  Recent Labs Lab 11/23/13 2222 11/24/13 0550 11/24/13 1840 11/25/13 0048 11/25/13 0747  TROPONINI 0.65* 0.74* 0.63* 0.54* 0.51*   BNP: No components found with this basename: POCBNP,  CBG: No results found for this basename:  GLUCAP,  in the last 168 hours  Recent Results (from the past 240 hour(s))  CULTURE, BLOOD (ROUTINE X 2)     Status: None   Collection Time    11/23/13 10:24 AM      Result Value Ref Range Status   Specimen Description BLOOD RIGHT ARM   Final   Special Requests BOTTLES DRAWN AEROBIC AND ANAEROBIC 5CC   Final   Culture  Setup Time     Final   Value: 11/23/2013 18:28     Performed at [facility]   Culture     Final   Value:        BLOOD CULTURE RECEIVED NO GROWTH TO DATE CULTURE WILL BE HELD FOR 5 DAYS BEFORE ISSUING A FINAL NEGATIVE REPORT     Performed at [facility]   Report Status PENDING   Incomplete  CULTURE, BLOOD (ROUTINE X 2)     Status: None   Collection Time     11/23/13 10:29 AM      Result Value Ref Range Status   Specimen Description BLOOD RIGHT ARM   Final   Special Requests BOTTLES DRAWN AEROBIC AND ANAEROBIC 10CC   Final   Culture  Setup Time     Final   Value: 11/23/2013 18:28     Performed at [facility]   Culture     Final   Value:        BLOOD CULTURE RECEIVED NO GROWTH TO DATE CULTURE WILL BE HELD FOR 5 DAYS BEFORE ISSUING A FINAL NEGATIVE REPORT     Performed at [facility]   Report Status PENDING   Incomplete  URINE CULTURE     Status: None   Collection Time    11/23/13  1:39 PM      Result Value Ref Range Status   Specimen Description URINE, CLEAN CATCH   Final   Special Requests NONE   Final   Culture  Setup Time     Final   Value: 11/23/2013 20:26     Performed at [place]     Final   Value: NO GROWTH     Performed at [facility]   Culture     Final   Value: NO GROWTH     Performed at [facility]   Report Status 11/24/2013 FINAL   Final  CLOSTRIDIUM DIFFICILE BY PCR     Status: None   Collection Time    11/24/13  4:37 PM      Result Value Ref Range Status   C difficile by pcr NEGATIVE  NEGATIVE Final     Studies:              All Imaging reviewed and is as per above notation   Scheduled Meds: . amiodarone  200 mg Oral Daily  . aspirin EC  81 mg Oral Daily  .  ceFAZolin (ANCEF) IV  2 g Intravenous Q12H  . [START ON 11/27/2013] darbepoetin (ARANESP) injection - DIALYSIS  100 mcg Intravenous Q Thu-HD  . feeding supplement (NEPRO CARB STEADY)  237 mL Oral BID BM  . heparin  5,000 Units Subcutaneous 3 times per day  . levothyroxine  200 mcg Oral QAC breakfast  . metoprolol tartrate  25 mg Oral BID  . midodrine  5 mg Oral BID WC  . mirtazapine  15 mg Oral QHS  . multivitamin  1 tablet Oral QHS  . nystatin  5 mL Oral QID  . pantoprazole  40 mg Oral BID AC  . sertraline  50 mg Oral Daily  . simethicone  80 mg Oral QID  . sodium chloride  3 mL  Intravenous Q12H   Continuous Infusions:    Assessment/Plan:  1. Abdominal pain- patient has been complaining of abdominal pain over the past 2 weeks. CT abdomen pelvis was done which only shows small ascites, no significant pathology. ID  recommended to stop rifampin which could be causing abdominal pain. Will consider GI eval in am.  Of note he has never had colonoscopy nor has he had endoscopy.  May benefit from GI [pathogen panel but will defer this decision to ID 2. Diarrhea- will check stool for C. difficile. Patient says that he is having 2-3 loose stools everyday.  Will recheck hemoccult 3. H/o Vt/VF-Meds changed per cardiology-had SVT this admit.  Metoprolol 25 bid, Amiodarone 200 daily.   4. Elevated troponin- patient underwent recent sternotomy with redo bentall procedure with homograft and debridement of root abscess.  Patient now has elevated troponin, which per cardiology is chronic.  Medical management 3. MSSA prosthetic valve/paravalvular abcess endocarditis-  patient is now off gentamicin and rifampin currently on Ancef 2 g regimen dialysis for total 8 weeks June 3 his last day of Ancef 4. Hypertension-continue low dose metoprolol [> for SVT].  Continue midodrine 5 mg bid 5. End-stage renal disease- patient currently on dialysis Tuesday Thursday and Saturday.  6. Hypothyroidism- continue Synthroid 200 mcg daily 7. Major Depression c psychoses in the past-continue Remeron 15 mg daily, Zoloft 50 mg daily  Code Status: Full code Family Communication: none at bedside Disposition Plan: inpatient pending resolution   Verneita Griffes, MD  [facility] Pager [phone] 11/25/2013, 11:03 AM    LOS: 2 days

## 2013-11-25 NOTE — Progress Notes (Signed)
[name] for Infectious Disease      Day # 28  beta lactam post surgery  Received 14 days of gentamicin  Received #25 days of post operative rifampin   Subjective: Developed severe abdominal pain, nausea, dry heaves thought due to rifampin but did not resolve with stopping rifampin  Antibiotics:  Anti-infectives   Start     Dose/Rate Route Frequency Ordered Stop   11/25/13 1200  ceFAZolin (ANCEF) IVPB 2 g/50 mL premix  Status:  Discontinued     2 g 100 mL/hr over 30 Minutes Intravenous Every T-Th-Sa (Hemodialysis) 11/23/13 1658 11/25/13 1101   11/25/13 1200  ceFAZolin (ANCEF) IVPB 2 g/50 mL premix     2 g 100 mL/hr over 30 Minutes Intravenous Every 12 hours 11/25/13 1101        Medications: Scheduled Meds: . amiodarone  200 mg Oral Daily  . aspirin EC  81 mg Oral Daily  .  ceFAZolin (ANCEF) IV  2 g Intravenous Q12H  . [START ON 11/27/2013] darbepoetin (ARANESP) injection - DIALYSIS  100 mcg Intravenous Q Thu-HD  . feeding supplement (NEPRO CARB STEADY)  237 mL Oral BID BM  . heparin  5,000 Units Subcutaneous 3 times per day  . levothyroxine  200 mcg Oral QAC breakfast  . metoprolol tartrate  25 mg Oral BID  . midodrine  5 mg Oral BID WC  . mirtazapine  15 mg Oral QHS  . multivitamin  1 tablet Oral QHS  . nystatin  5 mL Oral QID  . pantoprazole  40 mg Oral BID AC  . sertraline  50 mg Oral Daily  . simethicone  80 mg Oral QID  . sodium chloride  3 mL Intravenous Q12H   Continuous Infusions:   PRN Meds:.acetaminophen, acetaminophen, clonazePAM, ondansetron (ZOFRAN) IV, ondansetron   Objective: Weight change: -1 lb 15.8 oz (-0.9 kg)  Intake/Output Summary (Last 24 hours) at 11/25/13 1333 Last data filed at 11/24/13 1700  Gross per 24 hour  Intake    120 ml  Output    100 ml  Net     20 ml   Blood pressure 134/83, pulse 100, temperature 99.6 F (37.6 C), temperature source Oral, resp. rate 18, height 6\' 1"  (1.854 m), weight 185 lb 13.6 oz (84.3  kg), SpO2 93.00%. Temp:  [98.1 F (36.7 C)-100.8 F (38.2 C)] 99.6 F (37.6 C) (04/28 0824) Pulse Rate:  [100-111] 100 (04/28 0824) Resp:  [18-20] 18 (04/28 0824) BP: (116-134)/(79-87) 134/83 mmHg (04/28 0824) SpO2:  [92 %-94 %] 93 % (04/28 0824) Weight:  [185 lb 13.6 oz (84.3 kg)] 185 lb 13.6 oz (84.3 kg) (04/27 2057)  Physical Exam: General:aox 3 ,NAD op tongue with coating c/w thrush CV: RRR, systolic murmur Pulm: CTA anteriorly GI: tender diffusely, esp about epigastrium Skin:   Sternal load is feeling well:      Right groin wound with area that is raised with possible hematoma      NEuro : nonfocal  Neuro nonfocal Lab Results:  Recent Labs  11/24/13 0550 11/25/13 0747  WBC 9.0 9.4  HGB 8.8* 8.8*  HCT 28.2* 28.4*  PLT 306 276    BMET  Recent Labs  11/24/13 0550 11/25/13 0747  NA 136* 137  K 3.8 3.4*  CL 98 99  CO2 21 22  GLUCOSE 101* 101*  BUN 8 13  CREATININE 2.15* 2.45*  CALCIUM 8.6 8.8    Micro Results: Recent Results (from the past 240 hour(s))  CULTURE, BLOOD (ROUTINE X 2)     Status: None   Collection Time    11/23/13 10:24 AM      Result Value Ref Range Status   Specimen Description BLOOD RIGHT ARM   Final   Special Requests BOTTLES DRAWN AEROBIC AND ANAEROBIC 5CC   Final   Culture  Setup Time     Final   Value: 11/23/2013 18:28     Performed at [facility]   Culture     Final   Value:        BLOOD CULTURE RECEIVED NO GROWTH TO DATE CULTURE WILL BE HELD FOR 5 DAYS BEFORE ISSUING A FINAL NEGATIVE REPORT     Performed at [facility]   Report Status PENDING   Incomplete  CULTURE, BLOOD (ROUTINE X 2)     Status: None   Collection Time    11/23/13 10:29 AM      Result Value Ref Range Status   Specimen Description BLOOD RIGHT ARM   Final   Special Requests BOTTLES DRAWN AEROBIC AND ANAEROBIC 10CC   Final   Culture  Setup Time     Final   Value: 11/23/2013 18:28     Performed at [facility]   Culture      Final   Value:        BLOOD CULTURE RECEIVED NO GROWTH TO DATE CULTURE WILL BE HELD FOR 5 DAYS BEFORE ISSUING A FINAL NEGATIVE REPORT     Performed at [facility]   Report Status PENDING   Incomplete  URINE CULTURE     Status: None   Collection Time    11/23/13  1:39 PM      Result Value Ref Range Status   Specimen Description URINE, CLEAN CATCH   Final   Special Requests NONE   Final   Culture  Setup Time     Final   Value: 11/23/2013 20:26     Performed at [place]     Final   Value: NO GROWTH     Performed at [facility]   Culture     Final   Value: NO GROWTH     Performed at [facility]   Report Status 11/24/2013 FINAL   Final  CLOSTRIDIUM DIFFICILE BY PCR     Status: None   Collection Time    11/24/13  4:37 PM      Result Value Ref Range Status   C difficile by pcr NEGATIVE  NEGATIVE Final    Studies/Results: No results found.    Assessment/Plan: Bradley [name] is a 58 y.o. male with  MSSA prosthetic valve endocarditis with septic emboli to brain sp 6 weeks of naf/rif--> vanco/rifampin  now with paravalvular abscess BENTALL PROCEDURE With HOMOGRAFT, and debridement of root abscess by CT surgery   #1 Fever: low grade, continue to monitor and if gets above 101.5 get blood cultures and  Consider  with CT surgery idea of opening up or aspirating groin wound  #2 nausea abdominal pain: Certainly could be do to rifampin I agree with stopping it for now and see how he does off of it.   #3  Prosthetic valve endocarditis with paravalvular abascess and 3rd open heart surgery redo bentall and I and D of aortic root abscess.  --continue ANCEF and given his creatinine has not yet climbed that much with no HD since Saturday will increase dose to q 12 hours.  He should get a total of   FOR TOTAL OF  X 8  Weeks post OP--> June 3rd as the last day of Ancef  I am ok with observing off of rifampin  #4 ? Thrush on tongue: try  nystatin swish and swallow  #5 Groin : will need to watch this site see above          LOS: 2 days   Truman Hayward 11/25/2013, 1:33 PM

## 2013-11-25 NOTE — Progress Notes (Addendum)
Patient seen and examined.  Agree with note as outlined by Kerin Ransom, PA-C. Still mildly tachycardic.  Will change Lopressor to 25mg  BID.  No further workup of elevated Troponin which has been elevated consistently since Feb  2015 with normal coronary arteries on coronary CTA in March 2015.  He has been on Amio since March and his nausea just started a week ago so probably is not related to Amio.

## 2013-11-25 NOTE — Progress Notes (Signed)
    Subjective:  Pt admitted with complaints of anorexia and abdominal pain.   Objective:  Vital Signs in the last 24 hours: Temp:  [98.1 F (36.7 C)-100.8 F (38.2 C)] 99.6 F (37.6 C) (04/28 0824) Pulse Rate:  [74-111] 100 (04/28 0824) Resp:  [18-20] 18 (04/28 0824) BP: (116-139)/(79-93) 134/83 mmHg (04/28 0824) SpO2:  [92 %-100 %] 93 % (04/28 0824) Weight:  [185 lb 13.6 oz (84.3 kg)] 185 lb 13.6 oz (84.3 kg) (04/27 2057)  Intake/Output from previous day:  Intake/Output Summary (Last 24 hours) at 11/25/13 1023 Last data filed at 11/24/13 1700  Gross per 24 hour  Intake    120 ml  Output    100 ml  Net     20 ml    Physical Exam: General appearance: alert, cooperative and no distress Lungs: clear to auscultation bilaterally Heart: regular rate and rhythm Abdomen: soft, hyperactive bowel sounds   Rate: 98  Rhythm: normal sinus rhythm  Lab Results:  Recent Labs  11/24/13 0550 11/25/13 0747  WBC 9.0 9.4  HGB 8.8* 8.8*  PLT 306 276    Recent Labs  11/24/13 0550 11/25/13 0747  NA 136* 137  K 3.8 3.4*  CL 98 99  CO2 21 22  GLUCOSE 101* 101*  BUN 8 13  CREATININE 2.15* 2.45*    Recent Labs  11/25/13 0048 11/25/13 0747  TROPONINI 0.54* 0.51*   No results found for this basename: INR,  in the last 72 hours  Imaging: Imaging results have been reviewed  Cardiac Studies:  Assessment/Plan:        58 yo with a history ESRD,  S/p tissue AVR in 2011 with  redo Bentall using a homograft with debridement of aortic valve abscess on 10/28/13 by [name]. He had a complicated and prolonged hospitalization from 10/22/13 to 11/09/13. During that time he had been placed on Amio for VT/Vfib. He had normal coronaries in 02/2010. He had a cardiac Ct on 10/26/13 which revealed normal coronaries. Last echo was 09/09/13 and EF was 45-50%. He was admitted 11/23/13 with abdominal pain. We were asked to see for elevated Troponin.      Principal Problem:   Abdominal  pain Active Problems:   S/P AVR and aortoplasty March 2015   Elevated troponin   Aortic valve replaced- Bentall propceedure 2011   ESRD on dialysis   Severe protein-calorie malnutrition   LBBB (left bundle branch block)   Major depressive disorder, recurrent, severe with psychotic features- hospitalized in Jan 2013   Generalized anxiety disorder   Normal coronary arteries    PLAN: I reviewed previous labs- his Tropnin has never been normal going back to Feb 2015. Doubt he needs further work up for this. Low dose Metoprolol added and his B/P seems stable with this.     He is on Amiodarone 200 mg daily, I wonder if this could be contributing to his anorexia. He denies any bloody stools. He does says his symptoms seem to be worse after eating. GI work up per primary service.    Kerin Ransom PA-C [facility] [id number] 11/25/2013, 10:23 AM

## 2013-11-25 NOTE — Progress Notes (Signed)
[facility]  Patient Status: Active (receiving services up to time of hospitalization)  [facility] is providing the following services: RN and MSW  If patient discharges after hours, please call [phone].   Bradley [name] 11/25/2013, 11:26 AM

## 2013-11-25 NOTE — Progress Notes (Signed)
Subjective: Interval History: has no complaint, making some urine.  Objective: Vital signs in last 24 hours: Temp:  [98.1 F (36.7 C)-100.8 F (38.2 C)] 99.6 F (37.6 C) (04/28 0824) Pulse Rate:  [74-111] 100 (04/28 0824) Resp:  [18-20] 18 (04/28 0824) BP: (116-139)/(79-93) 134/83 mmHg (04/28 0824) SpO2:  [92 %-100 %] 93 % (04/28 0824) Weight:  [84.3 kg (185 lb 13.6 oz)] 84.3 kg (185 lb 13.6 oz) (04/27 2057) Weight change: -0.9 kg (-1 lb 15.8 oz)  Intake/Output from previous day: 04/27 0701 - 04/28 0700 In: 360 [P.O.:360] Out: 100 [Urine:100] Intake/Output this shift:    General appearance: alert, cooperative and pale Resp: diminished breath sounds LLL and rales LLL Chest wall: IJ cath Cardio: S1, S2 normal and systolic murmur: systolic ejection 2/6, decrescendo at 2nd left intercostal space GI: soft, non-tender; bowel sounds normal; no masses,  no organomegaly Extremities: avf b&t  Lab Results:  Recent Labs  11/24/13 0550 11/25/13 0747  WBC 9.0 9.4  HGB 8.8* 8.8*  HCT 28.2* 28.4*  PLT 306 276   BMET:  Recent Labs  11/24/13 0550 11/25/13 0747  NA 136* 137  K 3.8 3.4*  CL 98 99  CO2 21 22  GLUCOSE 101* 101*  BUN 8 13  CREATININE 2.15* 2.45*  CALCIUM 8.6 8.8   No results found for this basename: PTH,  in the last 72 hours Iron Studies: No results found for this basename: IRON, TIBC, TRANSFERRIN, FERRITIN,  in the last 72 hours  Studies/Results: Ct Abdomen Pelvis Wo Contrast  11/23/2013   CLINICAL DATA:  One week history of umbilical pain, cramping and nausea.  EXAM: CT ABDOMEN AND PELVIS WITHOUT CONTRAST  TECHNIQUE: Multidetector CT imaging of the abdomen and pelvis was performed following the standard protocol without intravenous contrast.  COMPARISON:  Most recent prior CT abdomen/ pelvis 09/06/2013  FINDINGS: Lower Chest: Small bilateral layering pleural effusions. Mild dependent atelectasis in the right lower lobe. Significant atelectasis and possible  consolidation in the visualized left lower lobe. Unchanged mild cardiomegaly. No pericardial effusion. Unremarkable distal thoracic esophagus.  Abdomen: Unenhanced CT was performed per clinician order. Lack of IV contrast limits sensitivity and specificity, especially for evaluation of abdominal/pelvic solid viscera. Within these limitations, unremarkable CT appearance of the stomach, duodenum, spleen, adrenal glands and pancreas. Normal hepatic contour morphology. Dystrophic calcification in the posterior right hemi liver is unchanged compared to prior. No discrete hepatic lesion. Geographic hypoattenuation in the left hemi-liver adjacent to the fissure for the falciform ligament is nonspecific but most suggestive of benign focal fatty infiltration. Gallbladder is unremarkable. No intra or extrahepatic biliary ductal dilatation.  No hydronephrosis. 3 mm nonobstructing stone in the lower pole of the right kidney. High attenuation foci within the left kidney likely represent small hemorrhagic cysts.  No evidence of obstruction or focal bowel wall thickening. Normal appendix in the right lower quadrant. The terminal ileum is unremarkable.  Pelvis: Small amount of intermediate attenuation ascites in the right lateral inguinal recess and with in the pelvis. Unremarkable appearance of the seminal vesicles. Mild prostatomegaly. Normal appearance of the bladder. Right indirect inguinal hernia containing omental fat and a small amount of pelvic ascites. Interval development of right inguinal masses compared to the prior study. The largest node measures 1.7 x 3.2 cm. There is associated overlying skin thickening. A second superficial nodular opacity measures 1.7 cm in diameter. There are new surgical clips overlying the right common femoral vessels immediately subjacent to the nodular opacity.  Bones/Soft Tissues:  No acute fracture or aggressive appearing lytic or blastic osseous lesion.  Vascular: Atherosclerotic vascular  disease without significant stenosis or aneurysmal dilatation.  IMPRESSION: 1. Small amount of intermediate attenuation ascites in the right lateral inguinal recess and deep anatomic pelvis of on certain etiology. No evidence of bowel obstruction or focal bowel wall thickening. And occult infectious/inflammatory enteritis or colitis is a consideration. 2. Right direct inguinal hernia containing omental fat and a small amount of ascitic fluid. 3. Interval development of nodular masslike densities in the right superficial inguinal region overlying new surgical clips an the common femoral vessels. Query clinical history of recent common femoral endarterectomy or attempted femoral loop graft placement? Given the presence of the surgical clips, the collection is favored to reflect a postoperative seroma or lymphocele. In the appropriate clinical setting, abscess, hematoma or lymphadenopathy are additional possibilities. 4. Left lower lobe atelectasis and possible consolidation. Left lower lobe pneumonia difficult to exclude. 5. Small bilateral layering pleural effusions. 6. Punctate nonobstructing stone in the lower pole of the right kidney. 7. Atherosclerotic vascular calcifications.   Electronically Signed   By: Jacqulynn Cadet M.D.   On: 11/23/2013 13:39   Dg Chest 2 View  11/23/2013   CLINICAL DATA:  Mid chest pain and shortness of breath. History of open-heart surgery 4 weeks ago.  EXAM: CHEST  2 VIEW  COMPARISON:  11/07/2013  FINDINGS: Left jugular catheter has been removed. Right jugular dual-lumen central venous catheter remains in place with tips overlying the right atrium, unchanged. The cardiac silhouette remains enlarged. There is improved aeration of the left lower lobe. There is a small left pleural effusion, decreased from prior. No definite right pleural effusion is identified. There is mild pulmonary vascular congestion without overt edema. No pneumothorax is seen. No acute osseous abnormality is  identified.  IMPRESSION: 1. Cardiomegaly with pulmonary vascular congestion. 2. Small left pleural effusion, decreased from prior. 3. Improved aeration of the left lower lobe. 4. Interval removal of left jugular catheter.   Electronically Signed   By: Logan Bores   On: 11/23/2013 11:58   Ct Head Wo Contrast  11/23/2013   CLINICAL DATA:  Generalized weakness  EXAM: CT HEAD WITHOUT CONTRAST  TECHNIQUE: Contiguous axial images were obtained from the base of the skull through the vertex without intravenous contrast.  COMPARISON:  Prior head CT 11/07/2013  FINDINGS: Negative for acute intracranial hemorrhage, acute infarction, mass, mass effect, hydrocephalus or midline shift. Gray-white differentiation is preserved throughout. Stable focal hypoattenuation in the right centrum semiovale and left parietal subcortical and deep white matter. Globes and orbits are symmetric and unremarkable bilaterally. No focal soft tissue or calvarial abnormality. Normal aeration of the mastoid air cells and paranasal sinuses. .  IMPRESSION: 1. No acute intracranial abnormality. 2. No significant interval change in the appearance of the brain compared to recent prior imaging from 11/07/2013.   Electronically Signed   By: Jacqulynn Cadet M.D.   On: 11/23/2013 13:42    I have reviewed the patient's current medications.  Assessment/Plan: 1 aki/CKD has some function.  Not clear if will be enough to get by, but can watch as acid/base/K and vol ok. 2 anemia stable, follow, may need sq epo. 3 endocarditis on ab dose adjust for GFR 4 HPTH  Follow 5 AVR and root replacement. 8 Hypothyroid P ab, follow cr ,vol, K , bicarb, dose adjust meds    LOS: 2 days   Jeneen Rinks L Arley Salamone 11/25/2013,10:02 AM

## 2013-11-26 DIAGNOSIS — Z954 Presence of other heart-valve replacement: Secondary | ICD-10-CM

## 2013-11-26 DIAGNOSIS — A4901 Methicillin susceptible Staphylococcus aureus infection, unspecified site: Secondary | ICD-10-CM

## 2013-11-26 LAB — CBC
HCT: 27.5 % — ABNORMAL LOW (ref 39.0–52.0)
Hemoglobin: 8.6 g/dL — ABNORMAL LOW (ref 13.0–17.0)
MCH: 30.2 pg (ref 26.0–34.0)
MCHC: 31.3 g/dL (ref 30.0–36.0)
MCV: 96.5 fL (ref 78.0–100.0)
Platelets: 277 10*3/uL (ref 150–400)
RBC: 2.85 MIL/uL — ABNORMAL LOW (ref 4.22–5.81)
RDW: 22.1 % — ABNORMAL HIGH (ref 11.5–15.5)
WBC: 8.2 10*3/uL (ref 4.0–10.5)

## 2013-11-26 LAB — COMPREHENSIVE METABOLIC PANEL
ALT: <5 U/L (ref 0–53)
AST: 14 U/L (ref 0–37)
Albumin: 1.8 g/dL — ABNORMAL LOW (ref 3.5–5.2)
Alkaline Phosphatase: 65 U/L (ref 39–117)
BUN: 17 mg/dL (ref 6–23)
CO2: 22 mEq/L (ref 19–32)
Calcium: 8.5 mg/dL (ref 8.4–10.5)
Chloride: 99 mEq/L (ref 96–112)
Creatinine, Ser: 2.46 mg/dL — ABNORMAL HIGH (ref 0.50–1.35)
GFR calc Af Amer: 32 mL/min — ABNORMAL LOW (ref >90)
GFR calc non Af Amer: 28 mL/min — ABNORMAL LOW (ref >90)
Glucose, Bld: 90 mg/dL (ref 70–99)
Potassium: 3.2 mEq/L — ABNORMAL LOW (ref 3.7–5.3)
Sodium: 138 mEq/L (ref 137–147)
Total Bilirubin: 0.3 mg/dL (ref 0.3–1.2)
Total Protein: 6.3 g/dL (ref 6.0–8.3)

## 2013-11-26 MED ORDER — NYSTATIN 100000 UNIT/ML MT SUSP
5.0000 mL | Freq: Four times a day (QID) | OROMUCOSAL | Status: DC
  Administered 2013-11-26 – 2013-11-27 (×3): 500000 [IU] via ORAL
  Filled 2013-11-26 (×6): qty 5

## 2013-11-26 MED ORDER — POTASSIUM CHLORIDE CRYS ER 20 MEQ PO TBCR
40.0000 meq | EXTENDED_RELEASE_TABLET | Freq: Once | ORAL | Status: AC
  Administered 2013-11-26: 40 meq via ORAL
  Filled 2013-11-26: qty 2

## 2013-11-26 NOTE — Progress Notes (Signed)
Note: This document was prepared with digital dictation and possible smart phrase technology. Any transcriptional errors that result from this process are unintentional.   Bradley [name] [id number] DOB: May 14, 1956 DOA: 11/23/2013 PCP: [name], MD  Brief narrative: 58 y/o ? known ESRD patient (TTS HD) ,aortic valve replacement at age 25-developed Aortic aneurysm Bentall Procedure + Av prosthesis- in 08/2013 with MSSA bacteremia with AV endocarditis and septic emboli causing embolic strokes.  Re-admitted 10/18/13 c L STclavic pain-? Osteo- underwent 3rd Bentall [id number] course complicate dby VDRF, VT-noted normal coronaries 02/2010 and Cardiac Ct 10/26/13 normal coronaries.  currently on Ancef and Rifampin through 12/31/13 thyroid cancer s/p thyroidectomy .   Admitted to hospital 11/23/13 c ? abd discomfort, soft darks tools, some CP no fever.  No chills.  NOted on admit ? LL PNA vs atelectasis, ? Non obst stone R kideny-occult stoolw as neg   Past medical history-As per Problem list Chart reviewed as below- reviewed  Consultants:  Cardiology  Infectious disease  Nephrology  Procedures:  None  Antibiotics:  Cefazolin 2 gm T-T-S    Subjective  Doing better Ambulated Food tastes like sand-paper.  Drinking a little. Ambulated around the unit   Objective    Interim History: none  Telemetry: SInus   Objective: Filed Vitals:   11/25/13 2029 11/26/13 0553 11/26/13 0900 11/26/13 1323  BP: 146/98 124/82 133/89 146/101  Pulse: 93 89 92 89  Temp: 99.1 F (37.3 C) 98.8 F (37.1 C) 98.6 F (37 C) 99.8 F (37.7 C)  TempSrc: Oral Oral Oral Oral  Resp: 18 19 22 21   Height:      Weight:  84.324 kg (185 lb 14.4 oz)    SpO2: 93% 94% 95% 96%    Intake/Output Summary (Last 24 hours) at 11/26/13 1450 Last data filed at 11/26/13 1300  Gross per 24 hour  Intake    120 ml  Output    301 ml  Net   -181 ml    Exam:  General: nad, eomi, tired appearing  CM Cardiovascular: s1 s2 no m/r/g.  Wound to chest appears clean Respiratory: clear, no added sound Abdomen: soft, NT, ND Skin no le edema Neuro intact   Data Reviewed: Basic Metabolic Panel:  Recent Labs Lab 11/23/13 1045 11/24/13 0550 11/25/13 0747 11/26/13 0540  NA 137 136* 137 138  K 3.6* 3.8 3.4* 3.2*  CL 96 98 99 99  CO2 25 21 22 22   GLUCOSE 96 101* 101* 90  BUN 6 8 13 17   CREATININE 2.04* 2.15* 2.45* 2.46*  CALCIUM 9.0 8.6 8.8 8.5   Liver Function Tests:  Recent Labs Lab 11/23/13 1045 11/24/13 0550 11/26/13 0540  AST 23 25 14   ALT 9 8 <5  ALKPHOS 88 77 65  BILITOT 0.4 0.3 0.3  PROT 7.6 6.7 6.3  ALBUMIN 2.2* 1.9* 1.8*   No results found for this basename: LIPASE, AMYLASE,  in the last 168 hours  Recent Labs Lab 11/23/13 1045  AMMONIA 20   CBC:  Recent Labs Lab 11/23/13 1045 11/24/13 0550 11/25/13 0747 11/26/13 0540  WBC 6.0 9.0 9.4 8.2  NEUTROABS 3.9  --   --   --   HGB 10.5* 8.8* 8.8* 8.6*  HCT 34.1* 28.2* 28.4* 27.5*  MCV 98.6 98.9 99.3 96.5  PLT 318 306 276 277   Cardiac Enzymes:  Recent Labs Lab 11/23/13 2222 11/24/13 0550 11/24/13 1840 11/25/13 0048 11/25/13 0747  TROPONINI 0.65* 0.74* 0.63* 0.54* 0.51*   BNP:  No components found with this basename: POCBNP,  CBG: No results found for this basename: GLUCAP,  in the last 168 hours  Recent Results (from the past 240 hour(s))  CULTURE, BLOOD (ROUTINE X 2)     Status: None   Collection Time    11/23/13 10:24 AM      Result Value Ref Range Status   Specimen Description BLOOD RIGHT ARM   Final   Special Requests BOTTLES DRAWN AEROBIC AND ANAEROBIC 5CC   Final   Culture  Setup Time     Final   Value: 11/23/2013 18:28     Performed at [facility]   Culture     Final   Value:        BLOOD CULTURE RECEIVED NO GROWTH TO DATE CULTURE WILL BE HELD FOR 5 DAYS BEFORE ISSUING A FINAL NEGATIVE REPORT     Performed at [facility]   Report Status PENDING   Incomplete   CULTURE, BLOOD (ROUTINE X 2)     Status: None   Collection Time    11/23/13 10:29 AM      Result Value Ref Range Status   Specimen Description BLOOD RIGHT ARM   Final   Special Requests BOTTLES DRAWN AEROBIC AND ANAEROBIC 10CC   Final   Culture  Setup Time     Final   Value: 11/23/2013 18:28     Performed at [facility]   Culture     Final   Value:        BLOOD CULTURE RECEIVED NO GROWTH TO DATE CULTURE WILL BE HELD FOR 5 DAYS BEFORE ISSUING A FINAL NEGATIVE REPORT     Performed at [facility]   Report Status PENDING   Incomplete  URINE CULTURE     Status: None   Collection Time    11/23/13  1:39 PM      Result Value Ref Range Status   Specimen Description URINE, CLEAN CATCH   Final   Special Requests NONE   Final   Culture  Setup Time     Final   Value: 11/23/2013 20:26     Performed at [facility]     Final   Value: NO GROWTH     Performed at [facility]   Culture     Final   Value: NO GROWTH     Performed at [facility]   Report Status 11/24/2013 FINAL   Final  CLOSTRIDIUM DIFFICILE BY PCR     Status: None   Collection Time    11/24/13  4:37 PM      Result Value Ref Range Status   C difficile by pcr NEGATIVE  NEGATIVE Final     Studies:              All Imaging reviewed and is as per above notation   Scheduled Meds: . amiodarone  200 mg Oral Daily  . aspirin EC  81 mg Oral Daily  .  ceFAZolin (ANCEF) IV  2 g Intravenous Q12H  . [START ON 11/27/2013] darbepoetin (ARANESP) injection - DIALYSIS  100 mcg Intravenous Q Thu-HD  . feeding supplement (NEPRO CARB STEADY)  237 mL Oral BID BM  . heparin  5,000 Units Subcutaneous 3 times per day  . levothyroxine  200 mcg Oral QAC breakfast  . metoprolol tartrate  25 mg Oral BID  . midodrine  5 mg Oral BID WC  . mirtazapine  15  mg Oral QHS  . multivitamin  1 tablet Oral QHS  . nystatin  5 mL Oral QID  . pantoprazole  40 mg Oral BID AC  . sertraline  50 mg Oral  Daily  . simethicone  80 mg Oral QID  . sodium chloride  3 mL Intravenous Q12H   Continuous Infusions:    Assessment/Plan:  1. Abdominal pain- patient abd pain somewhat better.  tol some sips.  Unlikely this is related to groing seroma.  MOnitor 2. Diarrhea- Cdiff neg.  Hemoccult neg.   3. H/o Vt/VF-Meds changed per cardiology-had SVT this admit.  Metoprolol 25 bid, Amiodarone 200 daily.   4. Elevated troponin- patient underwent recent sternotomy with redo bentall procedure with homograft and debridement of root abscess.  Patient now has elevated troponin, which per cardiology is chronic.  Medical management-no further work-up planned/warranted 3. MSSA prosthetic valve/paravalvular abcess endocarditis-  patient is now off gentamicin and rifampin currently on Ancef 2 g regimen dialysis for total 8 weeks June 3 his last day of Ancef  4. Hypertension-continue low dose metoprolol [> for SVT].  Continue midodrine 5 mg bid 5. End-stage renal disease- patient currently on dialysis Tuesday Thursday and Saturday. This has temporarily been held over the past couple of days as patient creatinine has stabilized. We will get repeat labs in the morning but this stage name, he may not need dialysis and we can discontinue his dialysis access in place a central line for IV administration of Ancef for the remainder of time for treatment 6. Hypothyroidism- continue Synthroid 200 mcg daily 7. Major Depression c psychoses in the past-continue Remeron 15 mg daily, Zoloft 50 mg daily  Code Status: Full code Family Communication: none at bedside Disposition Plan: inpatient pending resolution   Verneita Griffes, MD  [facility] Pager [phone] 11/26/2013, 2:50 PM    LOS: 3 days

## 2013-11-26 NOTE — Progress Notes (Signed)
SUBJECTIVE:  Nausea improved  OBJECTIVE:   Vitals:   Filed Vitals:   11/25/13 1452 11/25/13 2029 11/26/13 0553 11/26/13 0900  BP: 128/84 146/98 124/82 133/89  Pulse: 97 93 89 92  Temp: 99.3 F (37.4 C) 99.1 F (37.3 C) 98.8 F (37.1 C) 98.6 F (37 C)  TempSrc: Oral Oral Oral Oral  Resp: 18 18 19 22   Height:      Weight:   185 lb 14.4 oz (84.324 kg)   SpO2: 90% 93% 94% 95%   I&O's:   Intake/Output Summary (Last 24 hours) at 11/26/13 0950 Last data filed at 11/26/13 0900  Gross per 24 hour  Intake    120 ml  Output    301 ml  Net   -181 ml   TELEMETRY: Reviewed telemetry pt in NSR:     PHYSICAL EXAM General: Well developed, well nourished, in no acute distress Head: Eyes PERRLA, No xanthomas.   Normal cephalic and atramatic  Lungs:   Clear bilaterally to auscultation and percussion. Heart:   HRRR S1 S2 Pulses are 2+ & equal. Abdomen: Bowel sounds are positive, abdomen soft and non-tender without masses Extremities:   No clubbing, cyanosis or edema.  DP +1 Neuro: Alert and oriented X 3. Psych:  Good affect, responds appropriately   LABS: Basic Metabolic Panel:  Recent Labs  11/25/13 0747 11/26/13 0540  NA 137 138  K 3.4* 3.2*  CL 99 99  CO2 22 22  GLUCOSE 101* 90  BUN 13 17  CREATININE 2.45* 2.46*  CALCIUM 8.8 8.5   Liver Function Tests:  Recent Labs  11/24/13 0550 11/26/13 0540  AST 25 14  ALT 8 <5  ALKPHOS 77 65  BILITOT 0.3 0.3  PROT 6.7 6.3  ALBUMIN 1.9* 1.8*   No results found for this basename: LIPASE, AMYLASE,  in the last 72 hours CBC:  Recent Labs  11/23/13 1045  11/25/13 0747 11/26/13 0540  WBC 6.0  < > 9.4 8.2  NEUTROABS 3.9  --   --   --   HGB 10.5*  < > 8.8* 8.6*  HCT 34.1*  < > 28.4* 27.5*  MCV 98.6  < > 99.3 96.5  PLT 318  < > 276 277  < > = values in this interval not displayed. Cardiac Enzymes:  Recent Labs  11/24/13 1840 11/25/13 0048 11/25/13 0747  TROPONINI 0.63* 0.54* 0.51*   BNP: No components found  with this basename: POCBNP,  D-Dimer: No results found for this basename: DDIMER,  in the last 72 hours Hemoglobin A1C: No results found for this basename: HGBA1C,  in the last 72 hours Fasting Lipid Panel: No results found for this basename: CHOL, HDL, LDLCALC, TRIG, CHOLHDL, LDLDIRECT,  in the last 72 hours Thyroid Function Tests: No results found for this basename: TSH, T4TOTAL, FREET3, T3FREE, THYROIDAB,  in the last 72 hours Anemia Panel: No results found for this basename: VITAMINB12, FOLATE, FERRITIN, TIBC, IRON, RETICCTPCT,  in the last 72 hours Coag Panel:   Lab Results  Component Value Date   INR 1.51* 10/29/2013   INR 1.00 10/28/2013   INR 1.44 10/28/2013    RADIOLOGY: Ct Abdomen Pelvis Wo Contrast  11/23/2013   CLINICAL DATA:  One week history of umbilical pain, cramping and nausea.  EXAM: CT ABDOMEN AND PELVIS WITHOUT CONTRAST  TECHNIQUE: Multidetector CT imaging of the abdomen and pelvis was performed following the standard protocol without intravenous contrast.  COMPARISON:  Most recent prior CT abdomen/ pelvis 09/06/2013  FINDINGS: Lower Chest: Small bilateral layering pleural effusions. Mild dependent atelectasis in the right lower lobe. Significant atelectasis and possible consolidation in the visualized left lower lobe. Unchanged mild cardiomegaly. No pericardial effusion. Unremarkable distal thoracic esophagus.  Abdomen: Unenhanced CT was performed per clinician order. Lack of IV contrast limits sensitivity and specificity, especially for evaluation of abdominal/pelvic solid viscera. Within these limitations, unremarkable CT appearance of the stomach, duodenum, spleen, adrenal glands and pancreas. Normal hepatic contour morphology. Dystrophic calcification in the posterior right hemi liver is unchanged compared to prior. No discrete hepatic lesion. Geographic hypoattenuation in the left hemi-liver adjacent to the fissure for the falciform ligament is nonspecific but most  suggestive of benign focal fatty infiltration. Gallbladder is unremarkable. No intra or extrahepatic biliary ductal dilatation.  No hydronephrosis. 3 mm nonobstructing stone in the lower pole of the right kidney. High attenuation foci within the left kidney likely represent small hemorrhagic cysts.  No evidence of obstruction or focal bowel wall thickening. Normal appendix in the right lower quadrant. The terminal ileum is unremarkable.  Pelvis: Small amount of intermediate attenuation ascites in the right lateral inguinal recess and with in the pelvis. Unremarkable appearance of the seminal vesicles. Mild prostatomegaly. Normal appearance of the bladder. Right indirect inguinal hernia containing omental fat and a small amount of pelvic ascites. Interval development of right inguinal masses compared to the prior study. The largest node measures 1.7 x 3.2 cm. There is associated overlying skin thickening. A second superficial nodular opacity measures 1.7 cm in diameter. There are new surgical clips overlying the right common femoral vessels immediately subjacent to the nodular opacity.  Bones/Soft Tissues: No acute fracture or aggressive appearing lytic or blastic osseous lesion.  Vascular: Atherosclerotic vascular disease without significant stenosis or aneurysmal dilatation.  IMPRESSION: 1. Small amount of intermediate attenuation ascites in the right lateral inguinal recess and deep anatomic pelvis of on certain etiology. No evidence of bowel obstruction or focal bowel wall thickening. And occult infectious/inflammatory enteritis or colitis is a consideration. 2. Right direct inguinal hernia containing omental fat and a small amount of ascitic fluid. 3. Interval development of nodular masslike densities in the right superficial inguinal region overlying new surgical clips an the common femoral vessels. Query clinical history of recent common femoral endarterectomy or attempted femoral loop graft placement? Given  the presence of the surgical clips, the collection is favored to reflect a postoperative seroma or lymphocele. In the appropriate clinical setting, abscess, hematoma or lymphadenopathy are additional possibilities. 4. Left lower lobe atelectasis and possible consolidation. Left lower lobe pneumonia difficult to exclude. 5. Small bilateral layering pleural effusions. 6. Punctate nonobstructing stone in the lower pole of the right kidney. 7. Atherosclerotic vascular calcifications.   Electronically Signed   By: Jacqulynn Cadet M.D.   On: 11/23/2013 13:39   Dg Chest 1 View  10/27/2013   CLINICAL DATA:  Preop aortic valve replacement  EXAM: CHEST - 1 VIEW  COMPARISON:  CT HEART MORPH/PULM VEIN W/CM&W/O CA SCORE dated 10/26/2013; DG CHEST 1V PORT dated 09/18/2013  FINDINGS: Mild to moderate cardiac enlargement. Prominence of the aortic arch. Status post median sternotomy. Replacement cardiac aortic valve noted. Vascular pattern is normal. Lungs are clear except for mild scarring or atelectasis at the left base. There is a dual-lumen central line on the right with tips projecting over the right atrium.  IMPRESSION: No acute findings.   Electronically Signed   By: Skipper Cliche M.D.   On: 10/27/2013 17:32   Dg  Chest 2 View  11/23/2013   CLINICAL DATA:  Mid chest pain and shortness of breath. History of open-heart surgery 4 weeks ago.  EXAM: CHEST  2 VIEW  COMPARISON:  11/07/2013  FINDINGS: Left jugular catheter has been removed. Right jugular dual-lumen central venous catheter remains in place with tips overlying the right atrium, unchanged. The cardiac silhouette remains enlarged. There is improved aeration of the left lower lobe. There is a small left pleural effusion, decreased from prior. No definite right pleural effusion is identified. There is mild pulmonary vascular congestion without overt edema. No pneumothorax is seen. No acute osseous abnormality is identified.  IMPRESSION: 1. Cardiomegaly with  pulmonary vascular congestion. 2. Small left pleural effusion, decreased from prior. 3. Improved aeration of the left lower lobe. 4. Interval removal of left jugular catheter.   Electronically Signed   By: Logan Bores   On: 11/23/2013 11:58   Dg Chest 2 View  11/07/2013   CLINICAL DATA:  Aortic valve disease  EXAM: CHEST  2 VIEW  COMPARISON:  November 06, 2013  FINDINGS: Dual-lumen catheter tip is in the right atrium. The left jugular catheter tip is probably in the left second intercostal vein, stable. No pneumothorax.  There is consolidation in the left lower lobe with left effusion. Right lung is clear. Heart is enlarged with normal pulmonary vascularity.  IMPRESSION: Catheter positions are unchanged. No the unusual positioning of the left jugular catheter; the tip is probably in the superior left intercostal vein.  There is stable cardiomegaly. There is airspace consolidation in the left lower lobe with left effusion. Right lung is clear.   Electronically Signed   By: Lowella Grip M.D.   On: 11/07/2013 08:00   Ct Head Wo Contrast  11/23/2013   CLINICAL DATA:  Generalized weakness  EXAM: CT HEAD WITHOUT CONTRAST  TECHNIQUE: Contiguous axial images were obtained from the base of the skull through the vertex without intravenous contrast.  COMPARISON:  Prior head CT 11/07/2013  FINDINGS: Negative for acute intracranial hemorrhage, acute infarction, mass, mass effect, hydrocephalus or midline shift. Gray-white differentiation is preserved throughout. Stable focal hypoattenuation in the right centrum semiovale and left parietal subcortical and deep white matter. Globes and orbits are symmetric and unremarkable bilaterally. No focal soft tissue or calvarial abnormality. Normal aeration of the mastoid air cells and paranasal sinuses. .  IMPRESSION: 1. No acute intracranial abnormality. 2. No significant interval change in the appearance of the brain compared to recent prior imaging from 11/07/2013.    Electronically Signed   By: Jacqulynn Cadet M.D.   On: 11/23/2013 13:42   Ct Head W Wo Contrast  11/07/2013   CLINICAL DATA:  58 year old male with altered mental status and abnormal MRI findings raising the possibility of septic emboli. Initial encounter.  EXAM: CT HEAD WITHOUT AND WITH CONTRAST  TECHNIQUE: Contiguous axial images were obtained from the base of the skull through the vertex without and with intravenous contrast  CONTRAST:  91mL OMNIPAQUE IOHEXOL 300 MG/ML  SOLN  COMPARISON:  Brain MRI 09/09/2013.  Head CT 09/12/2013.  FINDINGS: Stable paranasal sinuses and mastoids, mild ethmoid mucosal thickening. No acute osseous abnormality identified. Visualized orbits and scalp soft tissues are within normal limits.  Stable cerebral volume. No ventriculomegaly. No midline shift, mass effect, or evidence of intracranial mass lesion. No acute or hyperdense intracranial hemorrhage identified. The largest of the abnormal areas by MRI in February correspond to small hypodense areas of encephalomalacia. No cerebral edema or associated mass  effect is evident. No superimposed acute cortically based infarct. Following contrast, no abnormal parenchymal enhancement. Curvilinear enhancement in the left superior hemisphere associated with the small cortical defect there might be a partially thrombosis enhancing vein. The visualized large dural venous sinuses and internal jugular bulbs are normally enhancing and patent.  IMPRESSION: 1. No new intracranial abnormality identified. 2. Scattered small foci of encephalomalacia corresponding to the largest lesions detected by MRI and February. No acute or hyperdense blood products. No intracranial mass effect. There might be a cortical vein abnormality associated with the lesion in the superior left parietal lobe.   Electronically Signed   By: Lars Pinks M.D.   On: 11/07/2013 07:58   Dg Chest Port 1 View  11/06/2013   CLINICAL DATA:  Postop, chest tube check  EXAM: PORTABLE  CHEST - 1 VIEW  COMPARISON:  DG CHEST 1V PORT dated 11/04/2013; DG CHEST 1V PORT dated 11/03/2013; DG CHEST 1V PORT dated 11/02/2013; DG CHEST 1V PORT dated 11/01/2013; CT HEART MORPH/PULM VEIN W/CM&W/O CA SCORE dated 10/26/2013; DG CHEST 1V PORT dated 4/3/2  FINDINGS: Grossly unchanged enlarged cardiac silhouette and mediastinal contours post median sternotomy. Stable positioning of support apparatus including left-sided catheter overlying the lateral aspect of the aortic arch, likely within the aortic nipple (left superior intercostal vein). Otherwise, stable position of support apparatus. Minimally improved aeration of the lungs with increase conspicuity of the pulmonary vasculature. Grossly unchanged small left-sided effusion associated left basilar heterogeneous / consolidative opacities. No new focal airspace opacities. Grossly unchanged bones.  IMPRESSION: 1. Stable positioning of support apparatus including left-sided catheter overlying the expected location of the aortic nipple (left superior intercostal vein). Nose, this has been reported on multiple prior examinations. Again, repositioning is advised. 2. Findings suggestive of improved pulmonary edema and atelectasis. 3. Unchanged small left-sided effusion and left basilar/retrocardiac opacities, likely atelectasis   Electronically Signed   By: Sandi Mariscal M.D.   On: 11/06/2013 07:44   Dg Chest Port 1 View  11/04/2013   CLINICAL DATA:  Check tube position.  EXAM: PORTABLE CHEST - 1 VIEW  COMPARISON:  DG CHEST 1V PORT dated 11/03/2013; DG CHEST 1V PORT dated 11/02/2013  FINDINGS: Left IJ tube in stable position projected over the left upper mediastinum, dialysis catheter noted with tip projected in the right atrium. Stable severe cardiomegaly. Prior median sternotomy. Left lower lobe atelectasis and/or infiltrate with left pleural effusion again noted and unchanged. No pneumothorax.  IMPRESSION: 1. Line and tube positions unchanged. The left IJ tube is projected over  the left upper mediastinum as noted on prior study. 2. Stable severe cardiomegaly.  Prior CABG. 3. Persistent left pleural of atelectasis and/or infiltrate with stable left pleural effusion. No change.   Electronically Signed   By: Marcello Moores  Register   On: 11/04/2013 08:02   Dg Chest Port 1 View  11/03/2013   CLINICAL DATA:  Aortic valve replacement.  EXAM: PORTABLE CHEST - 1 VIEW  COMPARISON:  11/02/2013.  FINDINGS: Right central line is in place with tips in the region of the cavoatrial junction an right atrium.  A left central line is in place. This appears to be in expected position of the left superior intercostal vein (drains left accessory hemi azygous vein). This may not be adequate for instillation of medication. Exact position indeterminate without lateral view.  Post median sternotomy.  Cardiomegaly.  Pleural effusions greater on the left suspected with consolidation lung bases greater on left which may represent associated atelectasis. Difficult to  exclude left base infectious infiltrate in the proper clinical setting.  No gross pneumothorax.  Mild pulmonary vascular congestion most notable centrally.  Prominent aortic knob.  IMPRESSION: Right central line is in place with tips in the region of the cavoatrial junction an right atrium.  A left central line is in place. This appears to be in expected position of the left superior intercostal vein (drains left accessory hemi azygous vein). This may not be adequate for instillation of medication. Exact position indeterminate without lateral view. Clinical correlation recommended. Reposition may be considered.  Cardiomegaly.  Pleural effusions greater on the left suspected with consolidation lung bases greater on left which may represent associated atelectasis. Difficult to exclude left base infectious infiltrate in the proper clinical setting.  These results were called by telephone at the time of interpretation on 11/03/2013 at 8:18 AM to [facility] patient's  nurse who verbally acknowledged these results. Request to call physician covering patient with results.   Electronically Signed   By: Chauncey Cruel M.D.   On: 11/03/2013 08:19   Dg Chest Port 1 View  11/02/2013   CLINICAL DATA:  Aortic valve replacement  EXAM: PORTABLE CHEST - 1 VIEW  COMPARISON:  11/01/2013  FINDINGS: Left-sided central lines and right IJ dialysis catheters are all stable in position. Previous median sternotomy noted. Stable cardiomegaly with vascular congestion. Persistent left lower lobe collapse/consolidation with a left effusion. No pneumothorax. No significant interval change. Interval removal of the mediastinal drain.  IMPRESSION: Stable cardiomegaly with vascular congestion  Persistent left lower lobe collapse/consolidation with a left effusion  Support apparatus unchanged   Electronically Signed   By: Daryll Brod M.D.   On: 11/02/2013 09:01   Dg Chest Port 1 View  11/01/2013   CLINICAL DATA:  Status post coronary bypass grafting  EXAM: PORTABLE CHEST - 1 VIEW  COMPARISON:  10/31/2013  FINDINGS: The endotracheal tube has been removed in the interval. Left-sided central venous lines are again noted and stable. Left retrocardiac consolidation is again seen. A small left-sided pleural effusion is noted as well. A right-sided dialysis catheter is noted and stable. The right chest tube has been removed in the interval.  IMPRESSION: Slight increase in and left pleural effusion. Stable left lower lobe consolidation is noted.   Electronically Signed   By: Inez Catalina M.D.   On: 11/01/2013 08:17   Dg Chest Port 1 View  10/31/2013   CLINICAL DATA:  Status post cardiac surgery.  EXAM: PORTABLE CHEST - 1 VIEW  COMPARISON:  10/30/2013  FINDINGS: Endotracheal tube is 4.6 cm above the carina. Defibrillator pads overlying the left side of the chest. There are chest drains without a pneumothorax. There are two left jugular central lines. One catheter is coiled in the left upper chest and unchanged.  The other jugular catheter is near the junction of the left subclavian vein and left innominate vein. Stable opacification at the left lung base probably represents consolidation and atelectasis. Dialysis catheter tip in the right atrium. Heart size is mildly enlarged.  IMPRESSION: Stable position of support apparatuses as described.  Negative for a pneumothorax.  Consolidation at the left lung base.   Electronically Signed   By: Markus Daft M.D.   On: 10/31/2013 08:48   Dg Chest Port 1 View  10/30/2013   CLINICAL DATA:  Endocarditis and periaortic valve abscess. Status post redo Bentall procedure.  EXAM: PORTABLE CHEST - 1 VIEW  COMPARISON:  DG CHEST 1V PORT dated 10/29/2013; CT HEART MORPH/PULM  VEIN W/CM&W/O CA SCORE dated 10/26/2013; CT CHEST W/CM dated 10/23/2013; DG CHEST 1 VIEW dated 10/27/2013; DG CHEST 1V PORT dated 10/28/2013; DG CHEST 1V PORT dated 3/31/2  FINDINGS: Endotracheal tube remains with the tip approximately 4 cm above the carina. Right-sided chest tube shows stable positioning. Dialysis catheter positioning on the right is stable. On the left side, a jugular sheath remains as well as an additional central line which coils in the left superior mediastinum. Based on prior CT studies, the tip of this catheter is felt to be likely deflected in a collateral vein that eventually drains posteriorly into a paraspinous hemi azygous vein. On prior CTs, the midline left brachycephalic vein appears to be stenotic.  No pneumothorax is identified. Lungs show no evidence of pulmonary edema. No pleural effusions are seen. The heart size and mediastinal contours are stable.  IMPRESSION: No pneumothorax or pulmonary edema. Stable positioning of support apparatus including a left jugular central line which is likely deflected into a mediastinal collateral vein based on prior CT anatomy.   Electronically Signed   By: Aletta Edouard M.D.   On: 10/30/2013 08:10   Dg Chest Port 1 View  10/29/2013   CLINICAL DATA:   Status post cardiac arrest. Endotracheal tube placement.  EXAM: PORTABLE CHEST - 1 VIEW  COMPARISON:  DG CHEST 1V PORT dated 10/29/2013; DG CHEST 1V PORT dated 10/28/2013; DG CHEST 1 VIEW dated 10/27/2013  FINDINGS: 1512 hr. Endotracheal tube tip is 4.2 cm above the base of the carina. Right IJ dialysis catheter tip overlies the mid to lower right atrium. There is a right chest tube in place without evidence for right-sided pneumothorax. The left neck sheath remains in place. A left neck vascular catheter remains with its tip coiled over the left aspect of the mediastinum, in the region of the transverse aorta. Defibrillator pads overlie the left chest. Patient is status post median sternotomy. Midline pericardial/ mediastinal drain is again noted.  No overt pulmonary edema or large pleural effusion. Much of the left lung is obscured by the defibrillator pads.  IMPRESSION: No substantial interval change in exam. The position of the left neck vascular catheter was discussed with the clinical team on 10/28/2013.   Electronically Signed   By: [name] Stanley M.D.   On: 10/29/2013 15:31   Dg Chest Portable 1 View In Am  10/29/2013   CLINICAL DATA:  Status post cardiac Sir  EXAM: PORTABLE CHEST - 1 VIEW  COMPARISON:  10/28/2013  FINDINGS: A a right-sided dialysis catheter and right-sided chest tube are again noted and stable. A nasogastric catheter is coiled within the stomach. The endotracheal tube is again seen 6 cm above the carina. A left jugular central line is noted via a sheath and coiled likely within the subclavian vein. This is stable from the prior exam. A mediastinal drain is noted as well.  Cardiac shadow is stable. Persistent left lower lobe atelectasis is noted. No new focal abnormality is seen. No pneumothorax is noted.  IMPRESSION: The overall appearance of the chest is stable from the prior exam.   Electronically Signed   By: Inez Catalina M.D.   On: 10/29/2013 07:53   Dg Chest Portable 1 View  10/28/2013    CLINICAL DATA:  Postop, check support apparatus  EXAM: PORTABLE CHEST - 1 VIEW  COMPARISON:  Prior chest x-ray earlier today at 8:08 a.m.;  FINDINGS: Endotracheal tube terminates 3.6 cm above the carina. Stable position of right IJ approach hemodialysis catheter with  tips projecting over the right atrium. Nasogastric tube is incompletely imaged. The tip lies below the diaphragm presumably within the stomach. Bilateral chest tubes are now present. Unchanged position of what appears to a left IJ vascular sheath and a left IJ central venous catheter. The tip of the catheter is folded upon itself in overlies the left upper lung. The position of this catheter is uncertain, however are remains unchanged. Stable cardiomegaly and mediastinal widening. No pneumothorax. Low inspiratory volumes with bibasilar atelectasis.  IMPRESSION: 1. Unchanged position of the left IJ approach central venous catheters. The catheter tip remains fold upon itself and an on certain location. If intravenous, it is likely within the subclavian vein. 2. Status post redo median sternotomy. New bilateral chest tubes and mediastinal drain. 3. Other support apparatus in stable and satisfactory position. 4. No significant interval change in the appearance of the chest.   Electronically Signed   By: Jacqulynn Cadet M.D.   On: 10/28/2013 22:28   Dg Chest Portable 1 View  10/28/2013   CLINICAL DATA:  Status post line placement  EXAM: PORTABLE CHEST - 1 VIEW  COMPARISON:  DG CHEST 1 VIEW dated 10/27/2013  FINDINGS: There is a catheter like structure that projects in the left internal jugular region but the terminus of this catheter is unclear. It appears to coil upon itself and the tip redirected superiorly outside the margin of the image. The loop overlies the anterior aspect of the left second rib. There is no postprocedure pneumothorax. The endotracheal tube tip lies approximately 4.7 cm above the crotch of the carina. The dual-lumen dialysis type  catheter placed via the right subclavian approach is unchanged. The esophagogastric tube tip and proximal port project off the inferior margin of the image. The lungs are mildly hypoinflated. There is retrocardiac atelectasis on the left. The cardiac silhouette is enlarged. There is a prosthetic aortic valve.  IMPRESSION: The positioning of the internal jugular catheter on the left is not adequate. The catheter may a folded upon itself and redirected its tip superiorly. Removal and replacement is recommended. These results were called by telephone at the time of interpretation on 10/28/2013 at 8:23 AM to Dineen Kid RN, who verbally acknowledged these results.   Electronically Signed   By: David  Martinique   On: 10/28/2013 08:26   Dg C-arm 1-60 Min-no Report  11/13/2013   : Fluoroscopy was utilized by the requesting physician. No radiographic interpretation.   Electronically Signed   By: Porfirio Mylar   On: 11/13/2013 15:26    Assessment/Plan:  58 yo with a history ESRD, S/p tissue AVR in 2011 with redo Bentall using a homograft with debridement of aortic valve abscess on 10/28/13 by [name]. He had a complicated and prolonged hospitalization from 10/22/13 to 11/09/13. During that time he had been placed on Amio for VT/Vfib. He had normal coronaries in 02/2010. He had a cardiac Ct on 10/26/13 which revealed normal coronaries. Last echo was 09/09/13 and EF was 45-50%. He was admitted 11/23/13 with abdominal pain. We were asked to see for elevated Troponin.  Principal Problem:  Abdominal pain  Active Problems:  S/P AVR and aortoplasty March 2015  Elevated troponin  Aortic valve replaced- Bentall propceedure 2011  ESRD on dialysis  Severe protein-calorie malnutrition  LBBB (left bundle branch block)  Major depressive disorder, recurrent, severe with psychotic features- hospitalized in Jan 2013  Generalized anxiety disorder  Normal coronary arteries by cardiac CT 10/16/2013 Sinus tachycardia resolved  after changing dosing  of metoprolol Hypokalemia - replete  No further workup of elevated troponin given normal coronary arteries by coronary CTA 09/2013 and troponin has been trending downward since his redo valve surgery.  Given his underlying CKD it may take a while to clear the troponin. Will sign off - call with any questions   Sueanne Margarita, MD  11/26/2013  9:50 AM

## 2013-11-26 NOTE — Progress Notes (Signed)
Paged Bradley Blinks NP regarding elevated BP of 130/92. Pt asymptomatic, resting in bed without complaints of distress. Per NP, no new orders at this time. Notify NP if BP rises above [id number] systolic, and over [id number] diastolic. earache

## 2013-11-26 NOTE — Progress Notes (Signed)
Subjective: Interval History: has no complaints, getting stronger.  No more abdm pain.  Objective: Vital signs in last 24 hours: Temp:  [98.6 F (37 C)-99.3 F (37.4 C)] 98.6 F (37 C) (04/29 0900) Pulse Rate:  [89-97] 92 (04/29 0900) Resp:  [18-22] 22 (04/29 0900) BP: (124-146)/(82-98) 133/89 mmHg (04/29 0900) SpO2:  [90 %-95 %] 95 % (04/29 0900) Weight:  [84.324 kg (185 lb 14.4 oz)] 84.324 kg (185 lb 14.4 oz) (04/29 0553) Weight change: 0.024 kg (0.8 oz)  Intake/Output from previous day: 04/28 0701 - 04/29 0700 In: -  Out: 301 [Urine:300; Stool:1] Intake/Output this shift: Total I/O In: 120 [P.O.:120] Out: -   General appearance: alert, cooperative and pale Resp: rales bibasilar Chest wall: PC Cardio: S1, S2 normal and systolic murmur: systolic ejection 2/6, decrescendo at 2nd left intercostal space GI: liver down 5 cm, pos bs, soft, nontender Extremities: AVF LLA  Lab Results:  Recent Labs  11/25/13 0747 11/26/13 0540  WBC 9.4 8.2  HGB 8.8* 8.6*  HCT 28.4* 27.5*  PLT 276 277   BMET:  Recent Labs  11/25/13 0747 11/26/13 0540  NA 137 138  K 3.4* 3.2*  CL 99 99  CO2 22 22  GLUCOSE 101* 90  BUN 13 17  CREATININE 2.45* 2.46*  CALCIUM 8.8 8.5   No results found for this basename: PTH,  in the last 72 hours Iron Studies: No results found for this basename: IRON, TIBC, TRANSFERRIN, FERRITIN,  in the last 72 hours  Studies/Results: No results found.  I have reviewed the patient's current medications.  Assessment/Plan: 1 AKI/CKD Cr plateau, Stage 4 GFR 25-30 cc/min. Adequate urine vol.  Acid/base ok K low , replete. 2 AVR/Aortic root. Per Dr. Roxy Horseman 3 Endocarditis  On AB, dose adjusted 4 Anemia stable, may need sq epo if cont off HD 5 HPTH 6 Abdm pain resolved P follow chem, vol, K, get cath out soon if no HD    LOS: 3 days   Claudina Oliphant L Aria Jarrard 11/26/2013,10:03 AM

## 2013-11-26 NOTE — Progress Notes (Signed)
Patient ID: Bradley [name], male   DOB: 07-07-1956, 58 y.o.   MRN: [id number]      [address]       ,[place] [address]             [phone]                  Subjective: S/p  10/28/2013  PREOPERATIVE DIAGNOSIS: Aortic root abscess.  POSTOPERATIVE DIAGNOSIS: Aortic root abscess.  SURGICAL PROCEDURE: Redo sternotomy, redo Bentall replacement of aortic  valve, aortic root, and ascending aorta with reimplantation of coronary  arteries using a homograft with debridement of aortic valve abscess,  placement of left femoral arterial line.  SURGEON: Lanelle Bal, MD  Patient admitted with abdominal discomfort, notes that he feels much better this morning without abdominal pain or nausea.  Objective: Vital signs in last 24 hours: Patient Vitals for the past 24 hrs:  BP Temp Temp src Pulse Resp SpO2 Weight  11/26/13 0900 133/89 mmHg 98.6 F (37 C) Oral 92 22 95 % -  11/26/13 0553 124/82 mmHg 98.8 F (37.1 C) Oral 89 19 94 % 185 lb 14.4 oz (84.324 kg)  11/25/13 2029 146/98 mmHg 99.1 F (37.3 C) Oral 93 18 93 % -  11/25/13 1452 128/84 mmHg 99.3 F (37.4 C) Oral 97 18 90 % -    Filed Weights   11/23/13 2014 11/24/13 2057 11/26/13 0553  Weight: 187 lb 13.3 oz (85.2 kg) 185 lb 13.6 oz (84.3 kg) 185 lb 14.4 oz (84.324 kg)    Hemodynamic parameters for last 24 hours:    Intake/Output from previous day: 04/28 0701 - 04/29 0700 In: -  Out: 301 [Urine:300; Stool:1] Intake/Output this shift: Total I/O In: 120 [P.O.:120] Out: -   Scheduled Meds: . amiodarone  200 mg Oral Daily  . aspirin EC  81 mg Oral Daily  .  ceFAZolin (ANCEF) IV  2 g Intravenous Q12H  . [START ON 11/27/2013] darbepoetin (ARANESP) injection - DIALYSIS  100 mcg Intravenous Q Thu-HD  . feeding supplement (NEPRO CARB STEADY)  237 mL Oral BID BM  . heparin  5,000 Units Subcutaneous 3 times per day  . levothyroxine  200 mcg Oral QAC breakfast  . metoprolol tartrate  25 mg Oral BID  .  midodrine  5 mg Oral BID WC  . mirtazapine  15 mg Oral QHS  . multivitamin  1 tablet Oral QHS  . nystatin  5 mL Oral QID  . pantoprazole  40 mg Oral BID AC  . potassium chloride  40 mEq Oral Once  . sertraline  50 mg Oral Daily  . simethicone  80 mg Oral QID  . sodium chloride  3 mL Intravenous Q12H   Continuous Infusions:  PRN Meds:.acetaminophen, acetaminophen, clonazePAM, ondansetron (ZOFRAN) IV, ondansetron  General appearance: alert and cooperative Neurologic: intact Heart: regular rate and rhythm, S1, S2 normal, no murmur, click, rub or gallop Lungs: clear to auscultation bilaterally Abdomen: soft, non-tender; bowel sounds normal; no masses,  no organomegaly Extremities: extremities normal, atraumatic, no cyanosis or edema, Homans sign is negative, no sign of DVT and Right groin area of previous cannulation is well-healed without evidence of infection, slight thickness tear but no obvious lymphocele Wound: Sternal incision is stable and well-healed without evidence of infection  Lab Results: CBC: Recent Labs  11/25/13 0747 11/26/13 0540  WBC 9.4 8.2  HGB 8.8* 8.6*  HCT 28.4* 27.5*  PLT 276 277   BMET:  Recent Labs  11/25/13 0747 11/26/13 0540  NA 137 138  K 3.4* 3.2*  CL 99 99  CO2 22 22  GLUCOSE 101* 90  BUN 13 17  CREATININE 2.45* 2.46*  CALCIUM 8.8 8.5    PT/INR: No results found for this basename: LABPROT, INR,  in the last 72 hours   Radiology No results found.   Assessment/Plan: S/P   10/28/2013  PREOPERATIVE DIAGNOSIS: Aortic root abscess.  POSTOPERATIVE DIAGNOSIS: Aortic root abscess.  SURGICAL PROCEDURE: Redo sternotomy, redo Bentall replacement of aortic  valve, aortic root, and ascending aorta with reimplantation of coronary  arteries using a homograft with debridement of aortic valve abscess,  placement of left femoral arterial line.   Overall the patient feels better from the abdominal complaints he had on admission, renal service notes  some improvement with baseline renal function, left arm swelling has improved We'll continue to observe monitor the right groin incision, at this point without enlarging lymphocele I would not proceed with aspiration at this point.    [name] MD 11/26/2013 10:25 AM

## 2013-11-26 NOTE — Progress Notes (Signed)
[facility]    Day # 29  beta lactam post surgery  Received 14 days of gentamicin  Received #25 days of post operative rifampin   Subjective: Feeling better  Antibiotics:  Anti-infectives   Start     Dose/Rate Route Frequency Ordered Stop   11/25/13 1200  ceFAZolin (ANCEF) IVPB 2 g/50 mL premix  Status:  Discontinued     2 g 100 mL/hr over 30 Minutes Intravenous Every T-Th-Sa (Hemodialysis) 11/23/13 1658 11/25/13 1101   11/25/13 1200  ceFAZolin (ANCEF) IVPB 2 g/50 mL premix     2 g 100 mL/hr over 30 Minutes Intravenous Every 12 hours 11/25/13 1101        Medications: Scheduled Meds: . amiodarone  200 mg Oral Daily  . aspirin EC  81 mg Oral Daily  .  ceFAZolin (ANCEF) IV  2 g Intravenous Q12H  . [START ON 11/27/2013] darbepoetin (ARANESP) injection - DIALYSIS  100 mcg Intravenous Q Thu-HD  . feeding supplement (NEPRO CARB STEADY)  237 mL Oral BID BM  . heparin  5,000 Units Subcutaneous 3 times per day  . levothyroxine  200 mcg Oral QAC breakfast  . metoprolol tartrate  25 mg Oral BID  . midodrine  5 mg Oral BID WC  . mirtazapine  15 mg Oral QHS  . multivitamin  1 tablet Oral QHS  . nystatin  5 mL Oral QID  . pantoprazole  40 mg Oral BID AC  . sertraline  50 mg Oral Daily  . simethicone  80 mg Oral QID  . sodium chloride  3 mL Intravenous Q12H   Continuous Infusions:   PRN Meds:.acetaminophen, acetaminophen, clonazePAM, ondansetron (ZOFRAN) IV, ondansetron   Objective: Weight change: 0.8 oz (0.024 kg)  Intake/Output Summary (Last 24 hours) at 11/26/13 1601 Last data filed at 11/26/13 1300  Gross per 24 hour  Intake    120 ml  Output    301 ml  Net   -181 ml   Blood pressure 146/101, pulse 89, temperature 99.8 F (37.7 C), temperature source Oral, resp. rate 21, height 6\' 1"  (1.854 m), weight 185 lb 14.4 oz (84.324 kg), SpO2 96.00%. Temp:  [98.6 F (37 C)-99.8 F (37.7 C)] 99.8 F (37.7 C) (04/29 1323) Pulse Rate:  [89-93]  89 (04/29 1323) Resp:  [18-22] 21 (04/29 1323) BP: (124-146)/(82-101) 146/101 mmHg (04/29 1323) SpO2:  [93 %-96 %] 96 % (04/29 1323) Weight:  [185 lb 14.4 oz (84.324 kg)] 185 lb 14.4 oz (84.324 kg) (04/29 0553)  Physical Exam: General:aox 3 ,NAD op tongue with coating c/w thrush CV: RRR, systolic murmur Pulm: CTA anteriorly GI: tender diffusely, esp about epigastrium Skin:   Sternal load is feeling well:      Right groin wound with area that is raised with possible hematoma      NEuro : nonfocal  Neuro nonfocal Lab Results:  Recent Labs  11/25/13 0747 11/26/13 0540  WBC 9.4 8.2  HGB 8.8* 8.6*  HCT 28.4* 27.5*  PLT 276 277    BMET  Recent Labs  11/25/13 0747 11/26/13 0540  NA 137 138  K 3.4* 3.2*  CL 99 99  CO2 22 22  GLUCOSE 101* 90  BUN 13 17  CREATININE 2.45* 2.46*  CALCIUM 8.8 8.5    Micro Results: Recent Results (from the past 240 hour(s))  CULTURE, BLOOD (ROUTINE X 2)     Status: None   Collection Time    11/23/13 10:24 AM  Result Value Ref Range Status   Specimen Description BLOOD RIGHT ARM   Final   Special Requests BOTTLES DRAWN AEROBIC AND ANAEROBIC 5CC   Final   Culture  Setup Time     Final   Value: 11/23/2013 18:28     Performed at [facility]   Culture     Final   Value:        BLOOD CULTURE RECEIVED NO GROWTH TO DATE CULTURE WILL BE HELD FOR 5 DAYS BEFORE ISSUING A FINAL NEGATIVE REPORT     Performed at [facility]   Report Status PENDING   Incomplete  CULTURE, BLOOD (ROUTINE X 2)     Status: None   Collection Time    11/23/13 10:29 AM      Result Value Ref Range Status   Specimen Description BLOOD RIGHT ARM   Final   Special Requests BOTTLES DRAWN AEROBIC AND ANAEROBIC 10CC   Final   Culture  Setup Time     Final   Value: 11/23/2013 18:28     Performed at [facility]   Culture     Final   Value:        BLOOD CULTURE RECEIVED NO GROWTH TO DATE CULTURE WILL BE HELD FOR 5 DAYS BEFORE ISSUING A  FINAL NEGATIVE REPORT     Performed at [facility]   Report Status PENDING   Incomplete  URINE CULTURE     Status: None   Collection Time    11/23/13  1:39 PM      Result Value Ref Range Status   Specimen Description URINE, CLEAN CATCH   Final   Special Requests NONE   Final   Culture  Setup Time     Final   Value: 11/23/2013 20:26     Performed at [facility]     Final   Value: NO GROWTH     Performed at [facility]   Culture     Final   Value: NO GROWTH     Performed at [facility]   Report Status 11/24/2013 FINAL   Final  CLOSTRIDIUM DIFFICILE BY PCR     Status: None   Collection Time    11/24/13  4:37 PM      Result Value Ref Range Status   C difficile by pcr NEGATIVE  NEGATIVE Final    Studies/Results: No results found.    Assessment/Plan: Bradley [name] is a 58 y.o. 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  #3  Prosthetic valve endocarditis with paravalvular abascess and 3rd open heart surgery redo bentall and I and D of aortic root abscess.  --continue ANCEF and given his creatinine has not yet climbed that much with no HD since Saturday will increase dose to q 12 hours. He should get a total of   FOR TOTAL OF  X 8  Weeks post OP--> June 3rd as the last day of Ancef  He will now need central line for IV antibiotics since I  understand his HD catheter will come out  I am ok with observing off of rifampin  #4 ? Thrush on tongue: try nystatin swish and swallow  #5 Groin : will need to watch  this site see above          LOS: 3 days   Truman Hayward 11/26/2013, 4:01 PM

## 2013-11-27 DIAGNOSIS — B370 Candidal stomatitis: Secondary | ICD-10-CM

## 2013-11-27 LAB — COMPREHENSIVE METABOLIC PANEL
ALT: <5 U/L (ref 0–53)
AST: 18 U/L (ref 0–37)
Albumin: 1.9 g/dL — ABNORMAL LOW (ref 3.5–5.2)
Alkaline Phosphatase: 65 U/L (ref 39–117)
BUN: 20 mg/dL (ref 6–23)
CO2: 20 mEq/L (ref 19–32)
Calcium: 8.8 mg/dL (ref 8.4–10.5)
Chloride: 101 mEq/L (ref 96–112)
Creatinine, Ser: 2.38 mg/dL — ABNORMAL HIGH (ref 0.50–1.35)
GFR calc Af Amer: 33 mL/min — ABNORMAL LOW (ref >90)
GFR calc non Af Amer: 29 mL/min — ABNORMAL LOW (ref >90)
Glucose, Bld: 96 mg/dL (ref 70–99)
Potassium: 3.8 mEq/L (ref 3.7–5.3)
Sodium: 140 mEq/L (ref 137–147)
Total Bilirubin: 0.3 mg/dL (ref 0.3–1.2)
Total Protein: 6.7 g/dL (ref 6.0–8.3)

## 2013-11-27 LAB — RENAL FUNCTION PANEL
Albumin: 1.9 g/dL — ABNORMAL LOW (ref 3.5–5.2)
BUN: 20 mg/dL (ref 6–23)
CO2: 21 mEq/L (ref 19–32)
Calcium: 8.9 mg/dL (ref 8.4–10.5)
Chloride: 100 mEq/L (ref 96–112)
Creatinine, Ser: 2.42 mg/dL — ABNORMAL HIGH (ref 0.50–1.35)
GFR calc Af Amer: 33 mL/min — ABNORMAL LOW (ref >90)
GFR calc non Af Amer: 28 mL/min — ABNORMAL LOW (ref >90)
Glucose, Bld: 94 mg/dL (ref 70–99)
Phosphorus: 4.6 mg/dL (ref 2.3–4.6)
Potassium: 3.9 mEq/L (ref 3.7–5.3)
Sodium: 140 mEq/L (ref 137–147)

## 2013-11-27 LAB — CBC
HCT: 31.3 % — ABNORMAL LOW (ref 39.0–52.0)
Hemoglobin: 9.8 g/dL — ABNORMAL LOW (ref 13.0–17.0)
MCH: 30.6 pg (ref 26.0–34.0)
MCHC: 31.3 g/dL (ref 30.0–36.0)
MCV: 97.8 fL (ref 78.0–100.0)
Platelets: 237 10*3/uL (ref 150–400)
RBC: 3.2 MIL/uL — ABNORMAL LOW (ref 4.22–5.81)
RDW: 21.9 % — ABNORMAL HIGH (ref 11.5–15.5)
WBC: 6.8 10*3/uL (ref 4.0–10.5)

## 2013-11-27 MED ORDER — FLUCONAZOLE 100 MG PO TABS
100.0000 mg | ORAL_TABLET | Freq: Every day | ORAL | Status: DC
  Administered 2013-11-27 – 2013-11-28 (×2): 100 mg via ORAL
  Filled 2013-11-27 (×2): qty 1

## 2013-11-27 MED ORDER — DARBEPOETIN ALFA-POLYSORBATE 150 MCG/0.3ML IJ SOLN
150.0000 ug | INTRAMUSCULAR | Status: DC
  Administered 2013-11-27: 150 ug via SUBCUTANEOUS
  Filled 2013-11-27: qty 0.3

## 2013-11-27 NOTE — Progress Notes (Signed)
[address]       [place] [address]             [phone]           Subjective: Feels much better, no current nausea  Objective    Temp:  [98.6 F (37 C)-99.8 F (37.7 C)] 98.9 F (37.2 C) (04/30 [id number]) Pulse Rate:  [82-92] 82 (04/30 0608) Resp:  [18-22] 18 (04/30 0608) BP: (123-190)/(76-101) 123/76 mmHg (04/30 0608) SpO2:  [94 %-96 %] 96 % (04/30 0608) Weight:  [188 lb 0.8 oz (85.3 kg)] 188 lb 0.8 oz (85.3 kg) (04/29 2144)   Intake/Output Summary (Last 24 hours) at 11/27/13 0854 Last data filed at 11/26/13 2145  Gross per 24 hour  Intake    120 ml  Output    300 ml  Net   -180 ml       General appearance: alert, cooperative and no distress right groin incis healing well, no current indication for drainage  Lab Results:  Recent Labs  11/25/13 0747 11/26/13 0540  NA 137 138  K 3.4* 3.2*  CL 99 99  CO2 22 22  GLUCOSE 101* 90  BUN 13 17  CREATININE 2.45* 2.46*  CALCIUM 8.8 8.5    Recent Labs  11/26/13 0540  AST 14  ALT <5  ALKPHOS 65  BILITOT 0.3  PROT 6.3  ALBUMIN 1.8*   No results found for this basename: LIPASE, AMYLASE,  in the last 72 hours  Recent Labs  11/25/13 0747 11/26/13 0540  WBC 9.4 8.2  HGB 8.8* 8.6*  HCT 28.4* 27.5*  MCV 99.3 96.5  PLT 276 277    Recent Labs  11/24/13 1840 11/25/13 0048 11/25/13 0747  TROPONINI 0.63* 0.54* 0.51*   No components found with this basename: POCBNP,  No results found for this basename: DDIMER,  in the last 72 hours No results found for this basename: HGBA1C,  in the last 72 hours No results found for this basename: CHOL, HDL, LDLCALC, TRIG, CHOLHDL,  in the last 72 hours No results found for this basename: TSH, T4TOTAL, FREET3, T3FREE, THYROIDAB,  in the last 72 hours No results found for this basename: VITAMINB12, FOLATE, FERRITIN, TIBC, IRON, RETICCTPCT,  in the last 72 hours  Medications: Scheduled . amiodarone  200 mg Oral Daily  . aspirin EC  81 mg  Oral Daily  .  ceFAZolin (ANCEF) IV  2 g Intravenous Q12H  . darbepoetin (ARANESP) injection - DIALYSIS  100 mcg Intravenous Q Thu-HD  . feeding supplement (NEPRO CARB STEADY)  237 mL Oral BID BM  . heparin  5,000 Units Subcutaneous 3 times per day  . levothyroxine  200 mcg Oral QAC breakfast  . metoprolol tartrate  25 mg Oral BID  . midodrine  5 mg Oral BID WC  . mirtazapine  15 mg Oral QHS  . multivitamin  1 tablet Oral QHS  . nystatin  5 mL Oral QID  . pantoprazole  40 mg Oral BID AC  . sertraline  50 mg Oral Daily  . simethicone  80 mg Oral QID  . sodium chloride  3 mL Intravenous Q12H     Radiology/Studies:  No results found.  INR: Will add last result for INR, ABG once components are confirmed Will add last 4 CBG results once components are confirmed  Assessment/Plan:  1 clinically stable, we will cont to observe incisions which are stable at this time    LOS:  4 days    Bradley [name] 4/30/20158:54 AM

## 2013-11-27 NOTE — Progress Notes (Signed)
Note: This document was prepared with digital dictation and possible smart phrase technology. Any transcriptional errors that result from this process are unintentional.   Bradley [name] DOB: [birth year] DOA: 11/23/2013 PCP: [name], MD  Brief narrative: 58 y/o ? known ESRD patient (TTS HD) ,aortic valve replacement at age 48-developed Aortic aneurysm Bentall Procedure + Av prosthesis- in 08/2013 with MSSA bacteremia with AV endocarditis and septic emboli causing embolic strokes.  Re-admitted 10/18/13 c L STclavic pain-? Osteo- underwent 3rd Bentall [id number] course complicate dby VDRF, VT-noted normal coronaries 02/2010 and Cardiac Ct 10/26/13 normal coronaries.  currently on Ancef and Rifampin through 12/31/13 thyroid cancer s/p thyroidectomy .   Admitted to hospital 11/23/13 c ? abd discomfort, soft darks tools, some CP no fever.  No chills.  NOted on admit ? LL PNA vs atelectasis, ? Non obst stone R kideny-occult stoolw as neg His creatinine trended down during hosptial stay and Renal and IfD were consutled His Abx were narrowed He was found to have Oral thrush and was tarted on Nystatin which was changed to FLuconazole [careful monitoring QtC as on Amio for VT] Plan is for Long term Central line once it can be safely declared patient has no furthe rDialysis needs per Nephrology   Past medical history-As per Problem list Chart reviewed as below- reviewed  Consultants:  Cardiology  Infectious disease  Nephrology  Procedures:  None  Antibiotics:  Cefazolin 2 gm T-T-S    Subjective  Doing better Food still tastes grainy No fever no chills no n/v/diarrhea No cp No blurred or double vision   Objective    Interim History: none  Telemetry: SInus   Objective: Filed Vitals:   11/26/13 2003 11/26/13 2144 11/27/13 0608 11/27/13 0945  BP: 190/92 148/100 123/76 142/96  Pulse:  90 82 84  Temp:  99.6 F (37.6 C) 98.9 F (37.2 C) 97.8 F (36.6 C)    TempSrc:  Oral Oral Oral  Resp:  18 18 18   Height:      Weight:  85.3 kg (188 lb 0.8 oz)    SpO2:  95% 96% 95%    Intake/Output Summary (Last 24 hours) at 11/27/13 1035 Last data filed at 11/27/13 0946  Gross per 24 hour  Intake      0 ml  Output    300 ml  Net   -300 ml    Exam:  General: nad, eomi, tired appearing CM Cardiovascular: s1 s2 no m/r/g.  Wound to chest appears clean Respiratory: clear, no added sound   Data Reviewed: Basic Metabolic Panel:  Recent Labs Lab 11/23/13 1045 11/24/13 0550 11/25/13 0747 11/26/13 0540 11/27/13 0855  NA 137 136* 137 138 140  140  K 3.6* 3.8 3.4* 3.2* 3.9  3.8  CL 96 98 99 99 100  101  CO2 25 21 22 22 21  20   GLUCOSE 96 101* 101* 90 94  96  BUN 6 8 13 17 20  20   CREATININE 2.04* 2.15* 2.45* 2.46* 2.42*  2.38*  CALCIUM 9.0 8.6 8.8 8.5 8.9  8.8  PHOS  --   --   --   --  4.6   Liver Function Tests:  Recent Labs Lab 11/23/13 1045 11/24/13 0550 11/26/13 0540 11/27/13 0855  AST 23 25 14 18   ALT 9 8 <5 <5  ALKPHOS 88 77 65 65  BILITOT 0.4 0.3 0.3 0.3  PROT 7.6 6.7 6.3 6.7  ALBUMIN 2.2* 1.9* 1.8* 1.9*  1.9*  No results found for this basename: LIPASE, AMYLASE,  in the last 168 hours  Recent Labs Lab 11/23/13 1045  AMMONIA 20   CBC:  Recent Labs Lab 11/23/13 1045 11/24/13 0550 11/25/13 0747 11/26/13 0540 11/27/13 0855  WBC 6.0 9.0 9.4 8.2 6.8  NEUTROABS 3.9  --   --   --   --   HGB 10.5* 8.8* 8.8* 8.6* 9.8*  HCT 34.1* 28.2* 28.4* 27.5* 31.3*  MCV 98.6 98.9 99.3 96.5 97.8  PLT 318 306 276 277 237   Cardiac Enzymes:  Recent Labs Lab 11/23/13 2222 11/24/13 0550 11/24/13 1840 11/25/13 0048 11/25/13 0747  TROPONINI 0.65* 0.74* 0.63* 0.54* 0.51*   BNP: No components found with this basename: POCBNP,  CBG: No results found for this basename: GLUCAP,  in the last 168 hours  Recent Results (from the past 240 hour(s))  CULTURE, BLOOD (ROUTINE X 2)     Status: None   Collection Time     11/23/13 10:24 AM      Result Value Ref Range Status   Specimen Description BLOOD RIGHT ARM   Final   Special Requests BOTTLES DRAWN AEROBIC AND ANAEROBIC 5CC   Final   Culture  Setup Time     Final   Value: 11/23/2013 18:28     Performed at [facility]   Culture     Final   Value:        BLOOD CULTURE RECEIVED NO GROWTH TO DATE CULTURE WILL BE HELD FOR 5 DAYS BEFORE ISSUING A FINAL NEGATIVE REPORT     Performed at [facility]   Report Status PENDING   Incomplete  CULTURE, BLOOD (ROUTINE X 2)     Status: None   Collection Time    11/23/13 10:29 AM      Result Value Ref Range Status   Specimen Description BLOOD RIGHT ARM   Final   Special Requests BOTTLES DRAWN AEROBIC AND ANAEROBIC 10CC   Final   Culture  Setup Time     Final   Value: 11/23/2013 18:28     Performed at [facility]   Culture     Final   Value:        BLOOD CULTURE RECEIVED NO GROWTH TO DATE CULTURE WILL BE HELD FOR 5 DAYS BEFORE ISSUING A FINAL NEGATIVE REPORT     Performed at [facility]   Report Status PENDING   Incomplete  URINE CULTURE     Status: None   Collection Time    11/23/13  1:39 PM      Result Value Ref Range Status   Specimen Description URINE, CLEAN CATCH   Final   Special Requests NONE   Final   Culture  Setup Time     Final   Value: 11/23/2013 20:26     Performed at [place]     Final   Value: NO GROWTH     Performed at [facility]   Culture     Final   Value: NO GROWTH     Performed at [facility]   Report Status 11/24/2013 FINAL   Final  CLOSTRIDIUM DIFFICILE BY PCR     Status: None   Collection Time    11/24/13  4:37 PM      Result Value Ref Range Status   C difficile by pcr NEGATIVE  NEGATIVE Final     Studies:  All Imaging reviewed and is as per above notation   Scheduled Meds: . amiodarone  200 mg Oral Daily  . aspirin EC  81 mg Oral Daily  .  ceFAZolin (ANCEF) IV  2 g Intravenous Q12H   . darbepoetin (ARANESP) injection - NON-DIALYSIS  150 mcg Subcutaneous Q Thu-1800  . feeding supplement (NEPRO CARB STEADY)  237 mL Oral BID BM  . fluconazole  100 mg Oral Daily  . heparin  5,000 Units Subcutaneous 3 times per day  . levothyroxine  200 mcg Oral QAC breakfast  . metoprolol tartrate  25 mg Oral BID  . midodrine  5 mg Oral BID WC  . mirtazapine  15 mg Oral QHS  . multivitamin  1 tablet Oral QHS  . pantoprazole  40 mg Oral BID AC  . sertraline  50 mg Oral Daily  . simethicone  80 mg Oral QID  . sodium chloride  3 mL Intravenous Q12H   Continuous Infusions:    Assessment/Plan:  1. Abdominal pain- patient abd pain somewhat better.  tol some sips.  Unlikely this is related to groing seroma.  Monitor 2. Diarrhea- Cdiff neg.  Hemoccult neg.  Diarrhea has resolved and patient is tolerating diet only fairly 3. Oropharyngeal thrush-Will change Nystatin to Fluconazole 100 daily-stop date 12/10/13. EKG in am to denote Qtc 4. H/o Vt/VF-Meds changed per cardiology-had SVT this admit.  Metoprolol 25 bid, Amiodarone 200 daily.   5. Elevated troponin- patient underwent recent sternotomy with redo bentall procedure with homograft and debridement of root abscess.  Patient now has elevated troponin, which per cardiology is chronic.  Medical management-no further work-up planned/warranted 3. MSSA prosthetic valve/paravalvular abcess endocarditis-  patient is now off gentamicin and rifampin currently on Ancef 2 g regimen dialysis for total 8 weeks June 3 his last day of Ancef -Antibiotic dosing q12 hourly 4. Hypertension-continue low dose metoprolol [> for SVT].  Continue midodrine 5 mg bid 5. End-stage renal disease- patient currently on dialysis Tuesday Thursday and Saturday. This has temporarily been held over the past couple of days as patient creatinine has stabilized. We will get repeat labs in the morning but this stage name, he may not need dialysis and we can discontinue his dialysis  access in place a central line for IV administration of Ancef for the remainder of time for treatment 6. Hypothyroidism- continue Synthroid 200 mcg daily 7. Major Depression c psychoses in the past-continue Remeron 15 mg daily, Zoloft 50 mg daily  Code Status: Full code Family Communication: discussed with sister and other relative at bedside Disposition Plan: inpatient, potential d/c home am   Verneita Griffes, MD  [facility] Pager [phone] 11/27/2013, 10:35 AM    LOS: 4 days

## 2013-11-27 NOTE — Progress Notes (Signed)
Subjective: Interval History: has complaints poor appetite.  Objective: Vital signs in last 24 hours: Temp:  [98.9 F (37.2 C)-99.8 F (37.7 C)] 98.9 F (37.2 C) (04/30 [id number]) Pulse Rate:  [82-90] 82 (04/30 0608) Resp:  [18-21] 18 (04/30 0608) BP: (123-190)/(76-101) 123/76 mmHg (04/30 0608) SpO2:  [94 %-96 %] 96 % (04/30 0608) Weight:  [85.3 kg (188 lb 0.8 oz)] 85.3 kg (188 lb 0.8 oz) (04/29 2144) Weight change: 0.976 kg (2 lb 2.4 oz)  Intake/Output from previous day: 04/29 0701 - 04/30 0700 In: 120 [P.O.:120] Out: 300 [Urine:300] Intake/Output this shift:    General appearance: alert, cooperative and pale Resp: rales bibasilar Chest wall: RIJ cath Cardio: S1, S2 normal and systolic murmur: systolic ejection 2/6, decrescendo at 2nd left intercostal space GI: soft, liver down 6 cm, soft Extremities: avf LLA, no edema  Lab Results:  Recent Labs  11/25/13 0747 11/26/13 0540  WBC 9.4 8.2  HGB 8.8* 8.6*  HCT 28.4* 27.5*  PLT 276 277   BMET:  Recent Labs  11/25/13 0747 11/26/13 0540  NA 137 138  K 3.4* 3.2*  CL 99 99  CO2 22 22  GLUCOSE 101* 90  BUN 13 17  CREATININE 2.45* 2.46*  CALCIUM 8.8 8.5   No results found for this basename: PTH,  in the last 72 hours Iron Studies: No results found for this basename: IRON, TIBC, TRANSFERRIN, FERRITIN,  in the last 72 hours  Studies/Results: No results found.  I have reviewed the patient's current medications.  Assessment/Plan: 1 CKD/AKI stable.  ???urine vol.  Acid/base/K ok.  ? If had value today 2 Anemia use SQ epo 3 AVR/Root replacement per Dr Roxy Horseman 4 Endocarditis on AB 5 Debill P epo, check chem, follow vol    LOS: 4 days   Ioanna Colquhoun L Elmar Antigua 11/27/2013,9:16 AM

## 2013-11-27 NOTE — Progress Notes (Signed)
Spoke with pt and his son, at bedside, and wife via phone.  Pt/wife concerned re: pt's Medicaid benefits "running" since pt did not go to a NH last admission, and does not plan to d/c to one following this admission.  Per wife, she has been told by pt's Medicaid worker that pt's benefits will end in May, but pt's wife plans to meet with worker to discuss further coverage.  Family concerned re: pt's [place] "going away" and the plan for pt to get IV Abx in the future.  Wife also plans on speaking with Social Security re: ? Benefits for pt.  CSW unable to speak with Medicaid this pm, but will ask unit CSW/RNCM to f/u in am.

## 2013-11-27 NOTE — Progress Notes (Signed)
[facility]     Day # 30  beta lactam post surgery  Received 14 days of gentamicin  Received #25 days of post operative rifampin   Subjective: dysphagia  Antibiotics:  Anti-infectives   Start     Dose/Rate Route Frequency Ordered Stop   11/27/13 1200  fluconazole (DIFLUCAN) tablet 100 mg     100 mg Oral Daily 11/27/13 1035     11/25/13 1200  ceFAZolin (ANCEF) IVPB 2 g/50 mL premix  Status:  Discontinued     2 g 100 mL/hr over 30 Minutes Intravenous Every T-Th-Sa (Hemodialysis) 11/23/13 1658 11/25/13 1101   11/25/13 1200  ceFAZolin (ANCEF) IVPB 2 g/50 mL premix     2 g 100 mL/hr over 30 Minutes Intravenous Every 12 hours 11/25/13 1101        Medications: Scheduled Meds: . amiodarone  200 mg Oral Daily  . aspirin EC  81 mg Oral Daily  .  ceFAZolin (ANCEF) IV  2 g Intravenous Q12H  . darbepoetin (ARANESP) injection - NON-DIALYSIS  150 mcg Subcutaneous Q Thu-1800  . feeding supplement (NEPRO CARB STEADY)  237 mL Oral BID BM  . fluconazole  100 mg Oral Daily  . heparin  5,000 Units Subcutaneous 3 times per day  . levothyroxine  200 mcg Oral QAC breakfast  . metoprolol tartrate  25 mg Oral BID  . midodrine  5 mg Oral BID WC  . mirtazapine  15 mg Oral QHS  . multivitamin  1 tablet Oral QHS  . pantoprazole  40 mg Oral BID AC  . sertraline  50 mg Oral Daily  . simethicone  80 mg Oral QID  . sodium chloride  3 mL Intravenous Q12H   Continuous Infusions:   PRN Meds:.acetaminophen, acetaminophen, clonazePAM, ondansetron (ZOFRAN) IV, ondansetron   Objective: Weight change: 2 lb 2.4 oz (0.976 kg)  Intake/Output Summary (Last 24 hours) at 11/27/13 1640 Last data filed at 11/27/13 0946  Gross per 24 hour  Intake      0 ml  Output    300 ml  Net   -300 ml   Blood pressure 140/99, pulse 84, temperature 98.3 F (36.8 C), temperature source Oral, resp. rate 20, height 6\' 1"  (1.854 m), weight 188 lb 0.8 oz (85.3 kg), SpO2 95.00%. Temp:   [97.8 F (36.6 C)-99.6 F (37.6 C)] 98.3 F (36.8 C) (04/30 1325) Pulse Rate:  [82-90] 84 (04/30 1325) Resp:  [18-20] 20 (04/30 1325) BP: (123-190)/(76-100) 140/99 mmHg (04/30 1325) SpO2:  [94 %-96 %] 95 % (04/30 1325) Weight:  [188 lb 0.8 oz (85.3 kg)] 188 lb 0.8 oz (85.3 kg) (04/29 2144)  Physical Exam: General:aox 3 ,NAD op tongue with coating c/w thrush CV: RRR, systolic murmur Pulm: CTA anteriorly GI: tender diffusely, esp about epigastrium Skin:   Sternal load is feeling well:  Right groin wound with area that is raised with possible hematoma   NEuro : nonfocal  Neuro nonfocal Lab Results:  Recent Labs  11/26/13 0540 11/27/13 0855  WBC 8.2 6.8  HGB 8.6* 9.8*  HCT 27.5* 31.3*  PLT 277 237    BMET  Recent Labs  11/26/13 0540 11/27/13 0855  NA 138 140  140  K 3.2* 3.9  3.8  CL 99 100  101  CO2 22 21  20   GLUCOSE 90 94  96  BUN 17 20  20   CREATININE 2.46* 2.42*  2.38*  CALCIUM 8.5 8.9  8.8  Micro Results: Recent Results (from the past 240 hour(s))  CULTURE, BLOOD (ROUTINE X 2)     Status: None   Collection Time    11/23/13 10:24 AM      Result Value Ref Range Status   Specimen Description BLOOD RIGHT ARM   Final   Special Requests BOTTLES DRAWN AEROBIC AND ANAEROBIC 5CC   Final   Culture  Setup Time     Final   Value: 11/23/2013 18:28     Performed at [facility]   Culture     Final   Value:        BLOOD CULTURE RECEIVED NO GROWTH TO DATE CULTURE WILL BE HELD FOR 5 DAYS BEFORE ISSUING A FINAL NEGATIVE REPORT     Performed at [facility]   Report Status PENDING   Incomplete  CULTURE, BLOOD (ROUTINE X 2)     Status: None   Collection Time    11/23/13 10:29 AM      Result Value Ref Range Status   Specimen Description BLOOD RIGHT ARM   Final   Special Requests BOTTLES DRAWN AEROBIC AND ANAEROBIC 10CC   Final   Culture  Setup Time     Final   Value: 11/23/2013 18:28     Performed at [facility]   Culture      Final   Value:        BLOOD CULTURE RECEIVED NO GROWTH TO DATE CULTURE WILL BE HELD FOR 5 DAYS BEFORE ISSUING A FINAL NEGATIVE REPORT     Performed at [facility]   Report Status PENDING   Incomplete  URINE CULTURE     Status: None   Collection Time    11/23/13  1:39 PM      Result Value Ref Range Status   Specimen Description URINE, CLEAN CATCH   Final   Special Requests NONE   Final   Culture  Setup Time     Final   Value: 11/23/2013 20:26     Performed at [place]     Final   Value: NO GROWTH     Performed at [facility]   Culture     Final   Value: NO GROWTH     Performed at [facility]   Report Status 11/24/2013 FINAL   Final  CLOSTRIDIUM DIFFICILE BY PCR     Status: None   Collection Time    11/24/13  4:37 PM      Result Value Ref Range Status   C difficile by pcr NEGATIVE  NEGATIVE Final    Studies/Results: No results found.    Assessment/Plan: KCI BAES is a 58 y.o. male with  MSSA prosthetic valve endocarditis with septic emboli to brain sp 6 weeks of naf/rif--> vanco/rifampin  now with paravalvular abscess BENTALL PROCEDURE With HOMOGRAFT, and debridement of root abscess by CT surgery   #1 Fever: seems resolved  #2 nausea abdominal pain: Certainly could be do to rifampin and seems better now.  #3  Prosthetic valve endocarditis with paravalvular abascess and 3rd open heart surgery redo bentall and I and D of aortic root abscess.  --continue ANCEF and given his creatinine has not yet climbed that much with no HD since Saturday will increase dose to q 12 hours. He should get a total of   FOR TOTAL OF  X 8  Weeks post OP--> June 3rd as the last day of Ancef  He  will now need central line for IV antibiotics since I understand his HD catheter will come out  I am ok with observing off of rifampin  IF HE GOES HOME WITH CENTRAL LINE WITH ANCEF 2G IV Q 12 HOURS THEN I NEED WEEKLY CBC AND BMP FAXED TO [facility] AT [phone]  #4 Thrush on tongue: switch to fluconazole 100mg  daily for 14 days  #5 Groin : will need to watch this site see above  HE HAS [facility] APPT WITH ME IN [facility] ON 12/10/13 @ 245 PM   I will sign off for now  Please call with further questions.       LOS: 4 days   Truman Hayward 11/27/2013, 4:40 PM

## 2013-11-28 ENCOUNTER — Inpatient Hospital Stay (HOSPITAL_COMMUNITY): Payer: Medicaid Other

## 2013-11-28 LAB — CBC WITH DIFFERENTIAL/PLATELET
Basophils Absolute: 0.1 10*3/uL (ref 0.0–0.1)
Basophils Relative: 1 % (ref 0–1)
Eosinophils Absolute: 0.5 10*3/uL (ref 0.0–0.7)
Eosinophils Relative: 7 % — ABNORMAL HIGH (ref 0–5)
HCT: 30.9 % — ABNORMAL LOW (ref 39.0–52.0)
Hemoglobin: 9.8 g/dL — ABNORMAL LOW (ref 13.0–17.0)
Lymphocytes Relative: 12 % (ref 12–46)
Lymphs Abs: 0.8 10*3/uL (ref 0.7–4.0)
MCH: 30.8 pg (ref 26.0–34.0)
MCHC: 31.7 g/dL (ref 30.0–36.0)
MCV: 97.2 fL (ref 78.0–100.0)
Monocytes Absolute: 0.8 10*3/uL (ref 0.1–1.0)
Monocytes Relative: 12 % (ref 3–12)
Neutro Abs: 4.7 10*3/uL (ref 1.7–7.7)
Neutrophils Relative %: 68 % (ref 43–77)
Platelets: 261 10*3/uL (ref 150–400)
RBC: 3.18 MIL/uL — ABNORMAL LOW (ref 4.22–5.81)
RDW: 21.1 % — ABNORMAL HIGH (ref 11.5–15.5)
WBC: 6.9 10*3/uL (ref 4.0–10.5)

## 2013-11-28 LAB — RENAL FUNCTION PANEL
Albumin: 1.9 g/dL — ABNORMAL LOW (ref 3.5–5.2)
BUN: 23 mg/dL (ref 6–23)
CO2: 21 mEq/L (ref 19–32)
Calcium: 8.7 mg/dL (ref 8.4–10.5)
Chloride: 103 mEq/L (ref 96–112)
Creatinine, Ser: 2.18 mg/dL — ABNORMAL HIGH (ref 0.50–1.35)
GFR calc Af Amer: 37 mL/min — ABNORMAL LOW (ref >90)
GFR calc non Af Amer: 32 mL/min — ABNORMAL LOW (ref >90)
Glucose, Bld: 86 mg/dL (ref 70–99)
Phosphorus: 4.8 mg/dL — ABNORMAL HIGH (ref 2.3–4.6)
Potassium: 3.8 mEq/L (ref 3.7–5.3)
Sodium: 143 mEq/L (ref 137–147)

## 2013-11-28 MED ORDER — NYSTATIN 500000 UNITS PO TABS: 500000.0000 [IU] | ORAL_TABLET | Freq: Three times a day (TID) | ORAL | Status: DC

## 2013-11-28 MED ORDER — CEFAZOLIN SODIUM-DEXTROSE 2-3 GM-% IV SOLR: INTRAVENOUS | Status: DC

## 2013-11-28 NOTE — Progress Notes (Signed)
Subjective:   Doing well. Appetite ok, abdominal pain improved. Very happy about improvement in kidney function  Objective Filed Vitals:   11/27/13 1325 11/27/13 1844 11/27/13 2118 11/28/13 0404  BP: 140/99 138/99 132/92 131/85  Pulse: 84 87 88 78  Temp: 98.3 F (36.8 C) 97.9 F (36.6 C) 98.2 F (36.8 C) 98.6 F (37 C)  TempSrc: Oral Oral Oral Oral  Resp: 20 21 19 17   Height:      Weight:   84.369 kg (186 lb)   SpO2: 95% 99% 98% 95%   Physical Exam General: alert and oriented. No acute distress Heart: RRR. 2/5 SEM. Healed surgical incision scar Lungs: CTA, unlabored. Abdomen: soft, nontender +BS Extremities: no edema Dialysis Access: L AVF +bruit/thill and perm cath  Assessment/Plan: 1. Abdominal pain- likely related to rifampin. Improved. Tolerating diet. 2. Prosthetic valve endocarditis- continue ancef per ID until June 3rd, for a total of 8 weeks post op- will need central line 2. ESRD - creat stable, 2.18 today. Holding off on HD.  Get perm cath out. 3. Anemia - hgb 9.8. aranesp q thursday 4. Secondary hyperparathyroidism - Ca+ 8.7. Phos 4.8 5. HTN/volume - no edema. Continue daily weights 6. Nutrition - alb 1.9. Renal diet, mulit vit. Appetite poor, tolerating nepro.  7. oropharygeal thrush- fluconazole until 5/13  Shelle Iron, NP [facility] Beeper [phone] 11/28/2013,9:35 AM  LOS: 5 days  I have seen and examined this patient and agree with the plan of care see my note .  Joyice Faster Myrta Mercer 11/28/2013, 11:36 AM   Additional Objective Labs: Basic Metabolic Panel:  Recent Labs Lab 11/26/13 0540 11/27/13 0855 11/28/13 0720  NA 138 140  140 143  K 3.2* 3.9  3.8 3.8  CL 99 100  101 103  CO2 22 21  20 21   GLUCOSE 90 94  96 86  BUN 17 20  20 23   CREATININE 2.46* 2.42*  2.38* 2.18*  CALCIUM 8.5 8.9  8.8 8.7  PHOS  --  4.6 4.8*   Liver Function Tests:  Recent Labs Lab 11/24/13 0550 11/26/13 0540 11/27/13 0855 11/28/13 0720   AST 25 14 18   --   ALT 8 <5 <5  --   ALKPHOS 77 65 65  --   BILITOT 0.3 0.3 0.3  --   PROT 6.7 6.3 6.7  --   ALBUMIN 1.9* 1.8* 1.9*  1.9* 1.9*   No results found for this basename: LIPASE, AMYLASE,  in the last 168 hours CBC:  Recent Labs Lab 11/23/13 1045 11/24/13 0550 11/25/13 0747 11/26/13 0540 11/27/13 0855 11/28/13 0720  WBC 6.0 9.0 9.4 8.2 6.8 6.9  NEUTROABS 3.9  --   --   --   --  PENDING  HGB 10.5* 8.8* 8.8* 8.6* 9.8* 9.8*  HCT 34.1* 28.2* 28.4* 27.5* 31.3* 30.9*  MCV 98.6 98.9 99.3 96.5 97.8 97.2  PLT 318 306 276 277 237 261   Blood Culture    Component Value Date/Time   SDES URINE, CLEAN CATCH 11/23/2013 1339   SPECREQUEST NONE 11/23/2013 1339   CULT  Value: NO GROWTH Performed at [facility] 11/23/2013 1339   REPTSTATUS 11/24/2013 FINAL 11/23/2013 1339    Cardiac Enzymes:  Recent Labs Lab 11/23/13 2222 11/24/13 0550 11/24/13 1840 11/25/13 0048 11/25/13 0747  TROPONINI 0.65* 0.74* 0.63* 0.54* 0.51*   CBG: No results found for this basename: GLUCAP,  in the last 168 hours Iron Studies: No results found for this basename: IRON, TIBC,  TRANSFERRIN, FERRITIN,  in the last 72 hours @lablastinr3 @ Studies/Results: No results found. Medications:   . amiodarone  200 mg Oral Daily  . aspirin EC  81 mg Oral Daily  .  ceFAZolin (ANCEF) IV  2 g Intravenous Q12H  . darbepoetin (ARANESP) injection - NON-DIALYSIS  150 mcg Subcutaneous Q Thu-1800  . feeding supplement (NEPRO CARB STEADY)  237 mL Oral BID BM  . fluconazole  100 mg Oral Daily  . heparin  5,000 Units Subcutaneous 3 times per day  . levothyroxine  200 mcg Oral QAC breakfast  . metoprolol tartrate  25 mg Oral BID  . midodrine  5 mg Oral BID WC  . mirtazapine  15 mg Oral QHS  . multivitamin  1 tablet Oral QHS  . pantoprazole  40 mg Oral BID AC  . sertraline  50 mg Oral Daily  . simethicone  80 mg Oral QID  . sodium chloride  3 mL Intravenous Q12H

## 2013-11-28 NOTE — Procedures (Signed)
Interventional Radiology Procedure Note  Procedure: LEFT IJ tunneled single lumen PowerPICC Complications: None Recommendations: - Routine line care  Signed,  Criselda Peaches, MD [facility]

## 2013-11-28 NOTE — Progress Notes (Signed)
Subjective: Interval History: has no complaint, putting out a fair amt of urine.  Objective: Vital signs in last 24 hours: Temp:  [97.9 F (36.6 C)-98.6 F (37 C)] 98.6 F (37 C) (05/01 0404) Pulse Rate:  [78-88] 78 (05/01 0404) Resp:  [17-21] 17 (05/01 0404) BP: (131-140)/(85-99) 131/85 mmHg (05/01 0404) SpO2:  [95 %-99 %] 95 % (05/01 0404) Weight:  [84.369 kg (186 lb)] 84.369 kg (186 lb) (04/30 2118) Weight change: -0.931 kg (-2 lb 0.8 oz)  Intake/Output from previous day: 04/30 0701 - 05/01 0700 In: 420 [P.O.:120; IV Piggyback:300] Out: -  Intake/Output this shift:    General appearance: alert, cooperative and pale Resp: rales LLL Chest wall: no tenderness, RIJ cath Cardio: S1, S2 normal and systolic murmur: systolic ejection 2/6, decrescendo at 2nd left intercostal space GI: pos bs, liver down 4 cm, soft Extremities: AVF L FA  Lab Results:  Recent Labs  11/27/13 0855 11/28/13 0720  WBC 6.8 6.9  HGB 9.8* 9.8*  HCT 31.3* 30.9*  PLT 237 261   BMET:  Recent Labs  11/27/13 0855 11/28/13 0720  NA 140  140 143  K 3.9  3.8 3.8  CL 100  101 103  CO2 21  20 21   GLUCOSE 94  96 86  BUN 20  20 23   CREATININE 2.42*  2.38* 2.18*  CALCIUM 8.9  8.8 8.7   No results found for this basename: PTH,  in the last 72 hours Iron Studies: No results found for this basename: IRON, TIBC, TRANSFERRIN, FERRITIN,  in the last 72 hours  Studies/Results: No results found.  I have reviewed the patient's current medications.  Assessment/Plan: 1 CKD 3/4 renal function stable.  Vol/acid/base K ok.  Will get Pc out with risk infx and not using 2 AVR/Root per Dr. Roxy Horseman 3 Endocarditis per ID 4 Anemia given epo P will get PC out  - IR., f/u chem in office next wk and OV in 2 wk    LOS: 5 days   Jayonna Meyering L Correne Lalani 11/28/2013,9:49 AM

## 2013-11-28 NOTE — Progress Notes (Addendum)
      [address]       [place] [address]             [phone]      Subjective:  Bradley [name] is doing well.  He is very happy that he has been taken off dialysis and may not have to resume it.    Objective: Vital signs in last 24 hours: Temp:  [97.8 F (36.6 C)-98.6 F (37 C)] 98.6 F (37 C) (05/01 0404) Pulse Rate:  [78-88] 78 (05/01 0404) Cardiac Rhythm:  [-]  Resp:  [17-21] 17 (05/01 0404) BP: (131-142)/(85-99) 131/85 mmHg (05/01 0404) SpO2:  [95 %-99 %] 95 % (05/01 0404) Weight:  [186 lb (84.369 kg)] 186 lb (84.369 kg) (04/30 2118)  Intake/Output from previous day: 04/30 0701 - 05/01 0700 In: 420 [P.O.:120; IV Piggyback:300] Out: -    General appearance: alert, cooperative and no distress Heart: regular rate and rhythm Lungs: clear to auscultation bilaterally Wound: clean and dry, seroma right groin approximately 2-3 cm in size, no erythema or drainage present  Lab Results:  Recent Labs  11/27/13 0855 11/28/13 0720  WBC 6.8 6.9  HGB 9.8* 9.8*  HCT 31.3* 30.9*  PLT 237 261   BMET:  Recent Labs  11/26/13 0540 11/27/13 0855  NA 138 140  140  K 3.2* 3.9  3.8  CL 99 100  101  CO2 22 21  20   GLUCOSE 90 94  96  BUN 17 20  20   CREATININE 2.46* 2.42*  2.38*  CALCIUM 8.5 8.9  8.8    PT/INR: No results found for this basename: LABPROT, INR,  in the last 72 hours ABG    Component Value Date/Time   PHART 7.515* 10/31/2013 1342   HCO3 25.6* 10/31/2013 1342   TCO2 27 10/31/2013 1342   ACIDBASEDEF 1.0 10/29/2013 1536   O2SAT 99.0 10/31/2013 1342   CBG (last 3)  No results found for this basename: GLUCAP,  in the last 72 hours  Assessment/Plan:  1. Right Groin Seroma- no signs of infection, will slowly resolve on its own.  Patient to follow up with [place] on 12/11/13.  He was instructed to contact our office immediately should this become reddened or develop drainage   LOS: 5 days    Bradley [name] 11/28/2013  I have seen and examined  Bradley [name] and agree with the above assessment  and plan.  Bradley Isaac MD Beeper [phone] Office [phone] 11/28/2013 4:42 PM

## 2013-11-28 NOTE — Progress Notes (Signed)
Pt discharged to home after visit summary reviewed and pt capable of re verbalizing medications and follow up appointments. Pt remains stable. No signs and symptoms of distress. Educated to return to ER in the event of SOB, dizziness, chest pain, or fainting. Rojelio Uhrich, RN   

## 2013-11-28 NOTE — Care Management Note (Signed)
CARE MANAGEMENT NOTE 11/28/2013  Patient:  Bradley [name], Bradley [name]   Account Number:  [id number]  Date Initiated:  11/24/2013  Documentation initiated by:  Lizabeth Leyden  Subjective/Objective Assessment:   admitted with abdominal pain  recent INPT: 3/25-4/12 CHF, staph bacteremia, endocarditis     Action/Plan:   11/28/2013 Met with pt and notified [facility] of  need to continue IV antibiotics.   Anticipated DC Date:  11/28/2013   Anticipated DC Plan:  [place]         Choice offered to / List presented to:          [facility] arranged  HH-1 RN      [place].   Status of service:  Completed, signed off Medicare Important Message given?   (If response is "NO", the following Medicare IM given date fields will be blank) Date Medicare IM given:   Date Additional Medicare IM given:    Discharge Disposition:  [place]  Per UR Regulation:    If discussed at Long Length of Stay Meetings, dates discussed:    Comments:  11/28/2013 Pt to d/c to home with IV Ancef, [facility] to continue [employer] services, per pt wishes. Jasmine Pang RN MPH, case manager, [phone]

## 2013-11-28 NOTE — Procedures (Signed)
Successful removal of (R)IJ tunneled HD catheter. No complications.  Ascencion Dike PA-C Interventional Radiology 11/28/2013 2:02 PM

## 2013-11-28 NOTE — Discharge Summary (Signed)
Physician Discharge Summary  Bradley [name] [id number] DOB: 02-09-1956 DOA: 11/23/2013  PCP: [name], MD  Admit date: 11/23/2013 Discharge date: 11/28/2013  Time spent: 45 minutes  Recommendations for Outpatient Follow-up:  1. Needs BMet and CBc weekly faxed to dr. Tommy Medal [phone] 2. Needs screeining TSH/LFTs' as on amio in 1 mo 3. Complete chronic Cefazolin 2 gm q 12 June 3rd. 4. Complete Nystatin 500,000 5/15 5. Follow up with CVTS MD Servando Snare and Renal MD Deterding   Discharge Diagnoses:  Principal Problem:   Abdominal pain Active Problems:   Aortic valve replaced- Bentall propceedure 2011   LBBB (left bundle branch block)   Major depressive disorder, recurrent, severe with psychotic features- hospitalized in Jan 2013   Generalized anxiety disorder   ESRD on dialysis   S/P AVR and aortoplasty March 2015   Severe protein-calorie malnutrition   Normal coronary arteries   Elevated troponin   Tachycardia   Discharge Condition: fair  Diet recommendation:  reg  Filed Weights   11/26/13 0553 11/26/13 2144 11/27/13 2118  Weight: 84.324 kg (185 lb 14.4 oz) 85.3 kg (188 lb 0.8 oz) 84.369 kg (186 lb)    History of present illness:  Brief narrative:  58 y/o ? known ESRD patient (TTS HD) ,aortic valve replacement at age 31-developed Aortic aneurysm Bentall Procedure + Av prosthesis- in 08/2013 with MSSA bacteremia with AV endocarditis and septic emboli causing embolic strokes. Re-admitted 10/18/13 c L STclavic pain-? Osteo- underwent 3rd Bentall [id number] course complicate dby VDRF, VT-noted normal coronaries 02/2010 and Cardiac Ct 10/26/13 normal coronaries. currently on Ancef and Rifampin through 12/31/13 thyroid cancer s/p thyroidectomy .  Admitted to hospital 11/23/13 c ? abd discomfort, soft darks tools, some CP no fever. No chills. NOted on admit ? LL PNA vs atelectasis, ? Non obst stone R kideny-occult stoolw as neg  His creatinine trended down during  hosptial stay and Renal and IfD were consutled  His Abx were narrowed  He was found to have Oral thrush and was started on Difluan but had to switch back to Nystatin for fear of risk of wide Qtc See below   Hospital Course:   1. Abdominal pain- patient abd pain somewhat better. tol some sips. Unlikely this is related to groing seroma. Monitor 2. Diarrhea- Cdiff neg. Hemoccult neg. Diarrhea has resolved and patient is tolerating diet only fairly mianly 2/2 to #3 3. Oropharyngeal thrush-Will continue Nystatin 500,000U tid for a total 2 more weeks-sto date 5/15 4. H/o Vt/VF-Meds changed per cardiology-had SVT this admit. Metoprolol 25 bid, Amiodarone 200 daily.   Needs screeningTSH and LFTs in 1 mo 5. Elevated troponin- patient underwent recent sternotomy with redo bentall procedure with homograft and debridement of root abscess. Patient now has elevated troponin, which per cardiology is chronic. Medical management-no further work-up planned/warranted 3. MSSA prosthetic valve/paravalvular abcess endocarditis- patient is now off gentamicin and rifampin currently on Ancef 2 g regimen dialysis for total 8 weeks June 3 his last day of Ancef -Antibiotic dosing q12 hourly-weekly cbc/bmet to be sent to ID 4. Hypertension-continue low dose metoprolol [> for SVT]. Continue midodrine 5 mg bid 5. End-stage renal disease- patient was on dialysis Tuesday Thursday and Saturday. This has temporarily been held   Nephrology okayed removel tunneled cath and chaned out for long term IV access.  Dr. Jimmy Footman to see him as OV 6. Hypothyroidism- continue Synthroid 200 mcg daily 7. Major Depression c psychoses in the past-continue Remeron 15 mg daily, Zoloft 50 mg daily  Consultants:  Cardiology  Infectious disease  Nephrology Procedures:  None Antibiotics:  Cefazolin 2 gm T-T-S Diflucan 100 bid 4/30-5/1 Nystatin 4/28-->5/15  Discharge Exam: Filed Vitals:   11/28/13 0404  BP: 131/85  Pulse: 78  Temp:  98.6 F (37 C)  Resp: 17    General: aeert oriented in nad,  Cardiovascular: s1 s2 no m/r/g Respiratory: clear  Discharge Instructions You were cared for by a hospitalist during your hospital stay. If you have any questions about your discharge medications or the care you received while you were in the hospital after you are discharged, you can call the unit and asked to speak with the hospitalist on call if the hospitalist that took care of you is not available. Once you are discharged, your primary care physician will handle any further medical issues. Please note that NO REFILLS for any discharge medications will be authorized once you are discharged, as it is imperative that you return to your primary care physician (or establish a relationship with a primary care physician if you do not have one) for your aftercare needs so that they can reassess your need for medications and monitor your lab values.  Discharge Orders   Future Appointments Provider Department Dept Phone   12/02/2013 10:30 AM Mal [name], MD [facility] [phone]   12/10/2013 12:10 PM Sharmon Revere [place] Office [phone]   12/10/2013 2:45 PM Truman Hayward, MD [facility] [phone]   12/11/2013 10:00 AM [name], MD [facility] [phone]   Future Orders Complete By Expires   Diet - low sodium heart healthy  As directed    Discharge instructions  As directed    Increase activity slowly  As directed        Medication List    STOP taking these medications       gentamicin 1-0.9 MG/ML-%  Commonly known as:  GARAMYCIN     rifampin 300 MG capsule  Commonly known as:  RIFADIN     sevelamer carbonate 800 MG tablet  Commonly known as:  RENVELA      TAKE these medications       amiodarone 200 MG tablet  Commonly known as:  PACERONE  Take 1 tablet (200 mg  total) by mouth daily.     aspirin EC 81 MG tablet  Take 81 mg by mouth daily.     ceFAZolin 2-3 GM-% Solr  Commonly known as:  ANCEF  Patent to get 2 grams every 12 hours until June 8th Report CBC, bmet levels to Dr. Lucianne Lei [place] office [phone]     clonazePAM 1 MG tablet  Commonly known as:  KLONOPIN  Take 1 mg by mouth 2 (two) times daily as needed for anxiety.     feeding supplement (NEPRO CARB STEADY) Liqd  Take 237 mLs by mouth 2 (two) times daily between meals.     ferrous [id number] C-folic acid capsule  Commonly known as:  TRINSICON / FOLTRIN  Take 1 capsule by mouth daily.     levothyroxine 200 MCG tablet  Commonly known as:  SYNTHROID, LEVOTHROID  Take 200 mcg by mouth daily before breakfast.     midodrine 5 MG tablet  Commonly known as:  PROAMATINE  Take 1 tablet (5 mg total) by mouth 2 (two) times daily with a meal.     mirtazapine 15 MG disintegrating tablet  Commonly known as:  REMERON SOL-TAB  Take 1 tablet (15 mg total) by mouth at bedtime.     nystatin 500000 UNITS Tabs tablet  Commonly known as:  MYCOSTATIN  Take 1 tablet (500,000 Units total) by mouth 3 (three) times daily.     ondansetron 4 MG disintegrating tablet  Commonly known as:  ZOFRAN-ODT  Take 1 tablet (4 mg total) by mouth every 8 (eight) hours as needed for nausea or vomiting.     pantoprazole 40 MG tablet  Commonly known as:  PROTONIX  Take 40 mg by mouth at bedtime.     sertraline 50 MG tablet  Commonly known as:  ZOLOFT  Take 50 mg by mouth daily.       Allergies  Allergen Reactions  . Oxycodone     Gives patient nightmares  . Rifampin Nausea Only      The results of significant diagnostics from this hospitalization (including imaging, microbiology, ancillary and laboratory) are listed below for reference.    Significant Diagnostic Studies: Ct Abdomen Pelvis Wo Contrast  11/23/2013   CLINICAL DATA:  One week history of umbilical pain, cramping and nausea.  EXAM:  CT ABDOMEN AND PELVIS WITHOUT CONTRAST  TECHNIQUE: Multidetector CT imaging of the abdomen and pelvis was performed following the standard protocol without intravenous contrast.  COMPARISON:  Most recent prior CT abdomen/ pelvis 09/06/2013  FINDINGS: Lower Chest: Small bilateral layering pleural effusions. Mild dependent atelectasis in the right lower lobe. Significant atelectasis and possible consolidation in the visualized left lower lobe. Unchanged mild cardiomegaly. No pericardial effusion. Unremarkable distal thoracic esophagus.  Abdomen: Unenhanced CT was performed per clinician order. Lack of IV contrast limits sensitivity and specificity, especially for evaluation of abdominal/pelvic solid viscera. Within these limitations, unremarkable CT appearance of the stomach, duodenum, spleen, adrenal glands and pancreas. Normal hepatic contour morphology. Dystrophic calcification in the posterior right hemi liver is unchanged compared to prior. No discrete hepatic lesion. Geographic hypoattenuation in the left hemi-liver adjacent to the fissure for the falciform ligament is nonspecific but most suggestive of benign focal fatty infiltration. Gallbladder is unremarkable. No intra or extrahepatic biliary ductal dilatation.  No hydronephrosis. 3 mm nonobstructing stone in the lower pole of the right kidney. High attenuation foci within the left kidney likely represent small hemorrhagic cysts.  No evidence of obstruction or focal bowel wall thickening. Normal appendix in the right lower quadrant. The terminal ileum is unremarkable.  Pelvis: Small amount of intermediate attenuation ascites in the right lateral inguinal recess and with in the pelvis. Unremarkable appearance of the seminal vesicles. Mild prostatomegaly. Normal appearance of the bladder. Right indirect inguinal hernia containing omental fat and a small amount of pelvic ascites. Interval development of right inguinal masses compared to the prior study. The  largest node measures 1.7 x 3.2 cm. There is associated overlying skin thickening. A second superficial nodular opacity measures 1.7 cm in diameter. There are new surgical clips overlying the right common femoral vessels immediately subjacent to the nodular opacity.  Bones/Soft Tissues: No acute fracture or aggressive appearing lytic or blastic osseous lesion.  Vascular: Atherosclerotic vascular disease without significant stenosis or aneurysmal dilatation.  IMPRESSION: 1. Small amount of intermediate attenuation ascites in the right lateral inguinal recess and deep anatomic pelvis of on certain etiology. No evidence of bowel obstruction or focal bowel wall thickening. And occult infectious/inflammatory enteritis or colitis is a consideration. 2. Right direct inguinal hernia containing omental fat and a small amount of ascitic fluid. 3. Interval development of nodular masslike  densities in the right superficial inguinal region overlying new surgical clips an the common femoral vessels. Query clinical history of recent common femoral endarterectomy or attempted femoral loop graft placement? Given the presence of the surgical clips, the collection is favored to reflect a postoperative seroma or lymphocele. In the appropriate clinical setting, abscess, hematoma or lymphadenopathy are additional possibilities. 4. Left lower lobe atelectasis and possible consolidation. Left lower lobe pneumonia difficult to exclude. 5. Small bilateral layering pleural effusions. 6. Punctate nonobstructing stone in the lower pole of the right kidney. 7. Atherosclerotic vascular calcifications.   Electronically Signed   By: Jacqulynn Cadet M.D.   On: 11/23/2013 13:39   Dg Chest 2 View  11/23/2013   CLINICAL DATA:  Mid chest pain and shortness of breath. History of open-heart surgery 4 weeks ago.  EXAM: CHEST  2 VIEW  COMPARISON:  11/07/2013  FINDINGS: Left jugular catheter has been removed. Right jugular dual-lumen central venous  catheter remains in place with tips overlying the right atrium, unchanged. The cardiac silhouette remains enlarged. There is improved aeration of the left lower lobe. There is a small left pleural effusion, decreased from prior. No definite right pleural effusion is identified. There is mild pulmonary vascular congestion without overt edema. No pneumothorax is seen. No acute osseous abnormality is identified.  IMPRESSION: 1. Cardiomegaly with pulmonary vascular congestion. 2. Small left pleural effusion, decreased from prior. 3. Improved aeration of the left lower lobe. 4. Interval removal of left jugular catheter.   Electronically Signed   By: Logan Bores   On: 11/23/2013 11:58   Dg Chest 2 View  11/07/2013   CLINICAL DATA:  Aortic valve disease  EXAM: CHEST  2 VIEW  COMPARISON:  November 06, 2013  FINDINGS: Dual-lumen catheter tip is in the right atrium. The left jugular catheter tip is probably in the left second intercostal vein, stable. No pneumothorax.  There is consolidation in the left lower lobe with left effusion. Right lung is clear. Heart is enlarged with normal pulmonary vascularity.  IMPRESSION: Catheter positions are unchanged. No the unusual positioning of the left jugular catheter; the tip is probably in the superior left intercostal vein.  There is stable cardiomegaly. There is airspace consolidation in the left lower lobe with left effusion. Right lung is clear.   Electronically Signed   By: Lowella Grip M.D.   On: 11/07/2013 08:00   Ct Head Wo Contrast  11/23/2013   CLINICAL DATA:  Generalized weakness  EXAM: CT HEAD WITHOUT CONTRAST  TECHNIQUE: Contiguous axial images were obtained from the base of the skull through the vertex without intravenous contrast.  COMPARISON:  Prior head CT 11/07/2013  FINDINGS: Negative for acute intracranial hemorrhage, acute infarction, mass, mass effect, hydrocephalus or midline shift. Gray-white differentiation is preserved throughout. Stable focal  hypoattenuation in the right centrum semiovale and left parietal subcortical and deep white matter. Globes and orbits are symmetric and unremarkable bilaterally. No focal soft tissue or calvarial abnormality. Normal aeration of the mastoid air cells and paranasal sinuses. .  IMPRESSION: 1. No acute intracranial abnormality. 2. No significant interval change in the appearance of the brain compared to recent prior imaging from 11/07/2013.   Electronically Signed   By: Jacqulynn Cadet M.D.   On: 11/23/2013 13:42   Ct Head W Wo Contrast  11/07/2013   CLINICAL DATA:  58 year old male with altered mental status and abnormal MRI findings raising the possibility of septic emboli. Initial encounter.  EXAM: CT HEAD WITHOUT AND  WITH CONTRAST  TECHNIQUE: Contiguous axial images were obtained from the base of the skull through the vertex without and with intravenous contrast  CONTRAST:  65mL OMNIPAQUE IOHEXOL 300 MG/ML  SOLN  COMPARISON:  Brain MRI 09/09/2013.  Head CT 09/12/2013.  FINDINGS: Stable paranasal sinuses and mastoids, mild ethmoid mucosal thickening. No acute osseous abnormality identified. Visualized orbits and scalp soft tissues are within normal limits.  Stable cerebral volume. No ventriculomegaly. No midline shift, mass effect, or evidence of intracranial mass lesion. No acute or hyperdense intracranial hemorrhage identified. The largest of the abnormal areas by MRI in February correspond to small hypodense areas of encephalomalacia. No cerebral edema or associated mass effect is evident. No superimposed acute cortically based infarct. Following contrast, no abnormal parenchymal enhancement. Curvilinear enhancement in the left superior hemisphere associated with the small cortical defect there might be a partially thrombosis enhancing vein. The visualized large dural venous sinuses and internal jugular bulbs are normally enhancing and patent.  IMPRESSION: 1. No new intracranial abnormality identified. 2.  Scattered small foci of encephalomalacia corresponding to the largest lesions detected by MRI and February. No acute or hyperdense blood products. No intracranial mass effect. There might be a cortical vein abnormality associated with the lesion in the superior left parietal lobe.   Electronically Signed   By: Lars Pinks M.D.   On: 11/07/2013 07:58   Ir Removal Tun Cv Cath W/o Fl  11/28/2013   CLINICAL DATA:  Chronic renal failure. No longer requiring tunneled HD catheter. Request removal  EXAM: REMOVAL TUNNELED CENTRAL VENOUS CATHETER  PROCEDURE: The patient's right chest and catheter was prepped and draped in a normal sterile fashion. Heparin was removed from both ports of catheter. 1% lidocaine was used for local anesthesia. Using gentle blunt dissection the cuff of the catheter was exposed and the catheter was removed in it's entirety. Pressure was held till hemostasis was obtained. A sterile dressing was applied. The patient tolerated the procedure well with no immediate complications.  COMPLICATIONS: None immediate.  IMPRESSION: Successful catheter removal as described above.  Read by: Ascencion Dike PA-C   Electronically Signed   By: Jacqulynn Cadet M.D.   On: 11/28/2013 14:07   Dg Chest Port 1 View  11/06/2013   CLINICAL DATA:  Postop, chest tube check  EXAM: PORTABLE CHEST - 1 VIEW  COMPARISON:  DG CHEST 1V PORT dated 11/04/2013; DG CHEST 1V PORT dated 11/03/2013; DG CHEST 1V PORT dated 11/02/2013; DG CHEST 1V PORT dated 11/01/2013; CT HEART MORPH/PULM VEIN W/CM&W/O CA SCORE dated 10/26/2013; DG CHEST 1V PORT dated 4/3/2  FINDINGS: Grossly unchanged enlarged cardiac silhouette and mediastinal contours post median sternotomy. Stable positioning of support apparatus including left-sided catheter overlying the lateral aspect of the aortic arch, likely within the aortic nipple (left superior intercostal vein). Otherwise, stable position of support apparatus. Minimally improved aeration of the lungs with increase  conspicuity of the pulmonary vasculature. Grossly unchanged small left-sided effusion associated left basilar heterogeneous / consolidative opacities. No new focal airspace opacities. Grossly unchanged bones.  IMPRESSION: 1. Stable positioning of support apparatus including left-sided catheter overlying the expected location of the aortic nipple (left superior intercostal vein). Nose, this has been reported on multiple prior examinations. Again, repositioning is advised. 2. Findings suggestive of improved pulmonary edema and atelectasis. 3. Unchanged small left-sided effusion and left basilar/retrocardiac opacities, likely atelectasis   Electronically Signed   By: Sandi Mariscal M.D.   On: 11/06/2013 07:44   Dg Chest [facility]  11/04/2013   CLINICAL DATA:  Check tube position.  EXAM: PORTABLE CHEST - 1 VIEW  COMPARISON:  DG CHEST 1V PORT dated 11/03/2013; DG CHEST 1V PORT dated 11/02/2013  FINDINGS: Left IJ tube in stable position projected over the left upper mediastinum, dialysis catheter noted with tip projected in the right atrium. Stable severe cardiomegaly. Prior median sternotomy. Left lower lobe atelectasis and/or infiltrate with left pleural effusion again noted and unchanged. No pneumothorax.  IMPRESSION: 1. Line and tube positions unchanged. The left IJ tube is projected over the left upper mediastinum as noted on prior study. 2. Stable severe cardiomegaly.  Prior CABG. 3. Persistent left pleural of atelectasis and/or infiltrate with stable left pleural effusion. No change.   Electronically Signed   By: Marcello Moores  Register   On: 11/04/2013 08:02   Dg Chest Port 1 View  11/03/2013   CLINICAL DATA:  Aortic valve replacement.  EXAM: PORTABLE CHEST - 1 VIEW  COMPARISON:  11/02/2013.  FINDINGS: Right central line is in place with tips in the region of the cavoatrial junction an right atrium.  A left central line is in place. This appears to be in expected position of the left superior intercostal vein (drains left  accessory hemi azygous vein). This may not be adequate for instillation of medication. Exact position indeterminate without lateral view.  Post median sternotomy.  Cardiomegaly.  Pleural effusions greater on the left suspected with consolidation lung bases greater on left which may represent associated atelectasis. Difficult to exclude left base infectious infiltrate in the proper clinical setting.  No gross pneumothorax.  Mild pulmonary vascular congestion most notable centrally.  Prominent aortic knob.  IMPRESSION: Right central line is in place with tips in the region of the cavoatrial junction an right atrium.  A left central line is in place. This appears to be in expected position of the left superior intercostal vein (drains left accessory hemi azygous vein). This may not be adequate for instillation of medication. Exact position indeterminate without lateral view. Clinical correlation recommended. Reposition may be considered.  Cardiomegaly.  Pleural effusions greater on the left suspected with consolidation lung bases greater on left which may represent associated atelectasis. Difficult to exclude left base infectious infiltrate in the proper clinical setting.  These results were called by telephone at the time of interpretation on 11/03/2013 at 8:18 AM to [facility] patient's nurse who verbally acknowledged these results. Request to call physician covering patient with results.   Electronically Signed   By: Chauncey Cruel M.D.   On: 11/03/2013 08:19   Dg Chest Port 1 View  11/02/2013   CLINICAL DATA:  Aortic valve replacement  EXAM: PORTABLE CHEST - 1 VIEW  COMPARISON:  11/01/2013  FINDINGS: Left-sided central lines and right IJ dialysis catheters are all stable in position. Previous median sternotomy noted. Stable cardiomegaly with vascular congestion. Persistent left lower lobe collapse/consolidation with a left effusion. No pneumothorax. No significant interval change. Interval removal of the mediastinal  drain.  IMPRESSION: Stable cardiomegaly with vascular congestion  Persistent left lower lobe collapse/consolidation with a left effusion  Support apparatus unchanged   Electronically Signed   By: Daryll Brod M.D.   On: 11/02/2013 09:01   Dg Chest Port 1 View  11/01/2013   CLINICAL DATA:  Status post coronary bypass grafting  EXAM: PORTABLE CHEST - 1 VIEW  COMPARISON:  10/31/2013  FINDINGS: The endotracheal tube has been removed in the interval. Left-sided central venous lines are again noted and stable. Left retrocardiac consolidation  is again seen. A small left-sided pleural effusion is noted as well. A right-sided dialysis catheter is noted and stable. The right chest tube has been removed in the interval.  IMPRESSION: Slight increase in and left pleural effusion. Stable left lower lobe consolidation is noted.   Electronically Signed   By: Inez Catalina M.D.   On: 11/01/2013 08:17   Dg Chest Port 1 View  10/31/2013   CLINICAL DATA:  Status post cardiac surgery.  EXAM: PORTABLE CHEST - 1 VIEW  COMPARISON:  10/30/2013  FINDINGS: Endotracheal tube is 4.6 cm above the carina. Defibrillator pads overlying the left side of the chest. There are chest drains without a pneumothorax. There are two left jugular central lines. One catheter is coiled in the left upper chest and unchanged. The other jugular catheter is near the junction of the left subclavian vein and left innominate vein. Stable opacification at the left lung base probably represents consolidation and atelectasis. Dialysis catheter tip in the right atrium. Heart size is mildly enlarged.  IMPRESSION: Stable position of support apparatuses as described.  Negative for a pneumothorax.  Consolidation at the left lung base.   Electronically Signed   By: Markus Daft M.D.   On: 10/31/2013 08:48   Dg Chest Port 1 View  10/30/2013   CLINICAL DATA:  Endocarditis and periaortic valve abscess. Status post redo Bentall procedure.  EXAM: PORTABLE CHEST - 1 VIEW   COMPARISON:  DG CHEST 1V PORT dated 10/29/2013; CT HEART MORPH/PULM VEIN W/CM&W/O CA SCORE dated 10/26/2013; CT CHEST W/CM dated 10/23/2013; DG CHEST 1 VIEW dated 10/27/2013; DG CHEST 1V PORT dated 10/28/2013; DG CHEST 1V PORT dated 3/31/2  FINDINGS: Endotracheal tube remains with the tip approximately 4 cm above the carina. Right-sided chest tube shows stable positioning. Dialysis catheter positioning on the right is stable. On the left side, a jugular sheath remains as well as an additional central line which coils in the left superior mediastinum. Based on prior CT studies, the tip of this catheter is felt to be likely deflected in a collateral vein that eventually drains posteriorly into a paraspinous hemi azygous vein. On prior CTs, the midline left brachycephalic vein appears to be stenotic.  No pneumothorax is identified. Lungs show no evidence of pulmonary edema. No pleural effusions are seen. The heart size and mediastinal contours are stable.  IMPRESSION: No pneumothorax or pulmonary edema. Stable positioning of support apparatus including a left jugular central line which is likely deflected into a mediastinal collateral vein based on prior CT anatomy.   Electronically Signed   By: Aletta Edouard M.D.   On: 10/30/2013 08:10   Dg Chest Port 1 View  10/29/2013   CLINICAL DATA:  Status post cardiac arrest. Endotracheal tube placement.  EXAM: PORTABLE CHEST - 1 VIEW  COMPARISON:  DG CHEST 1V PORT dated 10/29/2013; DG CHEST 1V PORT dated 10/28/2013; DG CHEST 1 VIEW dated 10/27/2013  FINDINGS: 1512 hr. Endotracheal tube tip is 4.2 cm above the base of the carina. Right IJ dialysis catheter tip overlies the mid to lower right atrium. There is a right chest tube in place without evidence for right-sided pneumothorax. The left neck sheath remains in place. A left neck vascular catheter remains with its tip coiled over the left aspect of the mediastinum, in the region of the transverse aorta. Defibrillator pads overlie  the left chest. Patient is status post median sternotomy. Midline pericardial/ mediastinal drain is again noted.  No overt pulmonary edema or large pleural  effusion. Much of the left lung is obscured by the defibrillator pads.  IMPRESSION: No substantial interval change in exam. The position of the left neck vascular catheter was discussed with the clinical team on 10/28/2013.   Electronically Signed   By: [name] Stanley M.D.   On: 10/29/2013 15:31    Microbiology: Recent Results (from the past 240 hour(s))  CULTURE, BLOOD (ROUTINE X 2)     Status: None   Collection Time    11/23/13 10:24 AM      Result Value Ref Range Status   Specimen Description BLOOD RIGHT ARM   Final   Special Requests BOTTLES DRAWN AEROBIC AND ANAEROBIC 5CC   Final   Culture  Setup Time     Final   Value: 11/23/2013 18:28     Performed at [facility]   Culture     Final   Value:        BLOOD CULTURE RECEIVED NO GROWTH TO DATE CULTURE WILL BE HELD FOR 5 DAYS BEFORE ISSUING A FINAL NEGATIVE REPORT     Performed at [facility]   Report Status PENDING   Incomplete  CULTURE, BLOOD (ROUTINE X 2)     Status: None   Collection Time    11/23/13 10:29 AM      Result Value Ref Range Status   Specimen Description BLOOD RIGHT ARM   Final   Special Requests BOTTLES DRAWN AEROBIC AND ANAEROBIC 10CC   Final   Culture  Setup Time     Final   Value: 11/23/2013 18:28     Performed at [facility]   Culture     Final   Value:        BLOOD CULTURE RECEIVED NO GROWTH TO DATE CULTURE WILL BE HELD FOR 5 DAYS BEFORE ISSUING A FINAL NEGATIVE REPORT     Performed at [facility]   Report Status PENDING   Incomplete  URINE CULTURE     Status: None   Collection Time    11/23/13  1:39 PM      Result Value Ref Range Status   Specimen Description URINE, CLEAN CATCH   Final   Special Requests NONE   Final   Culture  Setup Time     Final   Value: 11/23/2013 20:26     Performed at [place]     Final   Value: NO GROWTH     Performed at [facility]   Culture     Final   Value: NO GROWTH     Performed at [facility]   Report Status 11/24/2013 FINAL   Final  CLOSTRIDIUM DIFFICILE BY PCR     Status: None   Collection Time    11/24/13  4:37 PM      Result Value Ref Range Status   C difficile by pcr NEGATIVE  NEGATIVE Final     Labs: Basic Metabolic Panel:  Recent Labs Lab 11/24/13 0550 11/25/13 0747 11/26/13 0540 11/27/13 0855 11/28/13 0720  NA 136* 137 138 140  140 143  K 3.8 3.4* 3.2* 3.9  3.8 3.8  CL 98 99 99 100  101 103  CO2 21 22 22 21  20 21   GLUCOSE 101* 101* 90 94  96 86  BUN 8 13 17 20  20 23   CREATININE 2.15* 2.45* 2.46* 2.42*  2.38* 2.18*  CALCIUM 8.6 8.8 8.5 8.9  8.8 8.7  PHOS  --   --   --  4.6 4.8*   Liver Function Tests:  Recent Labs Lab 11/23/13 1045 11/24/13 0550 11/26/13 0540 11/27/13 0855 11/28/13 0720  AST 23 25 14 18   --   ALT 9 8 <5 <5  --   ALKPHOS 88 77 65 65  --   BILITOT 0.4 0.3 0.3 0.3  --   PROT 7.6 6.7 6.3 6.7  --   ALBUMIN 2.2* 1.9* 1.8* 1.9*  1.9* 1.9*   No results found for this basename: LIPASE, AMYLASE,  in the last 168 hours  Recent Labs Lab 11/23/13 1045  AMMONIA 20   CBC:  Recent Labs Lab 11/23/13 1045 11/24/13 0550 11/25/13 0747 11/26/13 0540 11/27/13 0855 11/28/13 0720  WBC 6.0 9.0 9.4 8.2 6.8 6.9  NEUTROABS 3.9  --   --   --   --  4.7  HGB 10.5* 8.8* 8.8* 8.6* 9.8* 9.8*  HCT 34.1* 28.2* 28.4* 27.5* 31.3* 30.9*  MCV 98.6 98.9 99.3 96.5 97.8 97.2  PLT 318 306 276 277 237 261   Cardiac Enzymes:  Recent Labs Lab 11/23/13 2222 11/24/13 0550 11/24/13 1840 11/25/13 0048 11/25/13 0747  TROPONINI 0.65* 0.74* 0.63* 0.54* 0.51*   BNP: BNP (last 3 results)  Recent Labs  09/10/13 0753 09/14/13 0445  PROBNP 37349.0* >70000.0*   CBG: No results found for this basename: GLUCAP,  in the last 168 hours     Signed:  Nita Sells  [facility] 11/28/2013, 2:14 PM

## 2013-11-29 LAB — CULTURE, BLOOD (ROUTINE X 2)
Culture: NO GROWTH
Culture: NO GROWTH

## 2013-12-01 ENCOUNTER — Encounter: Payer: Self-pay | Admitting: Physician Assistant

## 2013-12-01 ENCOUNTER — Encounter: Payer: Self-pay | Admitting: Vascular Surgery

## 2013-12-02 ENCOUNTER — Ambulatory Visit (INDEPENDENT_AMBULATORY_CARE_PROVIDER_SITE_OTHER): Payer: Medicaid Other | Admitting: Vascular Surgery

## 2013-12-02 ENCOUNTER — Encounter: Payer: Self-pay | Admitting: Vascular Surgery

## 2013-12-02 VITALS — BP 144/99 | HR 87 | Ht 73.0 in | Wt 187.0 lb

## 2013-12-02 DIAGNOSIS — M7989 Other specified soft tissue disorders: Secondary | ICD-10-CM

## 2013-12-02 DIAGNOSIS — N186 End stage renal disease: Secondary | ICD-10-CM

## 2013-12-02 NOTE — Progress Notes (Signed)
Subjective:     Patient ID: Bradley [name], male   DOB: [birth year], 57 y.o.   MRN: [id number]  HPI this 58 year old male returns for initial evaluation and followup regarding his left radial to cephalic AV fistula which I recently created. Patient is currently not on hemodialysis although he has been on bowels as in the past. He denies any pain or numbness in the left hand other than his left arm. He has had some swelling in the lower third of the left arm which is resolving.     Review of Systems     Objective:   Physical Exam BP 144/99  Pulse 87  Ht 6\' 1"  (1.854 m)  Wt 187 lb (84.823 kg)  BMI 24.68 kg/m2  SpO2 98%  Left upper extremity with well-healed radial incision and good pulse and palpable thrill over the fistula. Left hand is well perfused. Upper arm cephalic vein is known to be occluded on the left.      Assessment:     Nicely functioning left radial to cephalic A-V fistula-patient currently is off hemodialysis and he states that was told he may not need dialysis    Plan:     Patient return in 2 months with duplex scan of left radial to cephalic AV fistula. We'll then determine status of whether he needs hemodialysis. Possibility that we may need to superficialize the fistula-will determine at that time

## 2013-12-06 ENCOUNTER — Other Ambulatory Visit: Payer: Self-pay

## 2013-12-06 ENCOUNTER — Emergency Department (HOSPITAL_COMMUNITY): Payer: Medicaid Other

## 2013-12-06 ENCOUNTER — Encounter (HOSPITAL_COMMUNITY): Payer: Self-pay | Admitting: Emergency Medicine

## 2013-12-06 ENCOUNTER — Inpatient Hospital Stay (HOSPITAL_COMMUNITY)
Admission: EM | Admit: 2013-12-06 | Discharge: 2013-12-10 | DRG: 291 | Disposition: A | Discharge status: Home Health | Payer: Medicaid Other | Attending: Internal Medicine | Admitting: Internal Medicine

## 2013-12-06 DIAGNOSIS — G0490 Encephalitis and encephalomyelitis, unspecified: Secondary | ICD-10-CM

## 2013-12-06 DIAGNOSIS — I4729 Other ventricular tachycardia: Secondary | ICD-10-CM | POA: Diagnosis present

## 2013-12-06 DIAGNOSIS — Z954 Presence of other heart-valve replacement: Secondary | ICD-10-CM

## 2013-12-06 DIAGNOSIS — I10 Essential (primary) hypertension: Secondary | ICD-10-CM

## 2013-12-06 DIAGNOSIS — E890 Postprocedural hypothyroidism: Secondary | ICD-10-CM

## 2013-12-06 DIAGNOSIS — I959 Hypotension, unspecified: Secondary | ICD-10-CM

## 2013-12-06 DIAGNOSIS — I213 ST elevation (STEMI) myocardial infarction of unspecified site: Secondary | ICD-10-CM

## 2013-12-06 DIAGNOSIS — I76 Septic arterial embolism: Secondary | ICD-10-CM

## 2013-12-06 DIAGNOSIS — I428 Other cardiomyopathies: Secondary | ICD-10-CM

## 2013-12-06 DIAGNOSIS — R7881 Bacteremia: Secondary | ICD-10-CM

## 2013-12-06 DIAGNOSIS — R778 Other specified abnormalities of plasma proteins: Secondary | ICD-10-CM | POA: Diagnosis present

## 2013-12-06 DIAGNOSIS — R0609 Other forms of dyspnea: Secondary | ICD-10-CM

## 2013-12-06 DIAGNOSIS — Z992 Dependence on renal dialysis: Secondary | ICD-10-CM | POA: Diagnosis present

## 2013-12-06 DIAGNOSIS — I472 Ventricular tachycardia, unspecified: Secondary | ICD-10-CM

## 2013-12-06 DIAGNOSIS — F411 Generalized anxiety disorder: Secondary | ICD-10-CM

## 2013-12-06 DIAGNOSIS — R634 Abnormal weight loss: Secondary | ICD-10-CM

## 2013-12-06 DIAGNOSIS — Z952 Presence of prosthetic heart valve: Secondary | ICD-10-CM

## 2013-12-06 DIAGNOSIS — Z9889 Other specified postprocedural states: Secondary | ICD-10-CM

## 2013-12-06 DIAGNOSIS — I509 Heart failure, unspecified: Secondary | ICD-10-CM

## 2013-12-06 DIAGNOSIS — M7989 Other specified soft tissue disorders: Secondary | ICD-10-CM

## 2013-12-06 DIAGNOSIS — F333 Major depressive disorder, recurrent, severe with psychotic symptoms: Secondary | ICD-10-CM

## 2013-12-06 DIAGNOSIS — N179 Acute kidney failure, unspecified: Secondary | ICD-10-CM

## 2013-12-06 DIAGNOSIS — I5023 Acute on chronic systolic (congestive) heart failure: Secondary | ICD-10-CM

## 2013-12-06 DIAGNOSIS — J811 Chronic pulmonary edema: Secondary | ICD-10-CM

## 2013-12-06 DIAGNOSIS — R4182 Altered mental status, unspecified: Secondary | ICD-10-CM

## 2013-12-06 DIAGNOSIS — T827XXA Infection and inflammatory reaction due to other cardiac and vascular devices, implants and grafts, initial encounter: Secondary | ICD-10-CM | POA: Diagnosis present

## 2013-12-06 DIAGNOSIS — I330 Acute and subacute infective endocarditis: Secondary | ICD-10-CM

## 2013-12-06 DIAGNOSIS — R000 Tachycardia, unspecified: Secondary | ICD-10-CM

## 2013-12-06 DIAGNOSIS — I5043 Acute on chronic combined systolic (congestive) and diastolic (congestive) heart failure: Principal | ICD-10-CM | POA: Diagnosis present

## 2013-12-06 DIAGNOSIS — T826XXA Infection and inflammatory reaction due to cardiac valve prosthesis, initial encounter: Secondary | ICD-10-CM | POA: Diagnosis present

## 2013-12-06 DIAGNOSIS — R451 Restlessness and agitation: Secondary | ICD-10-CM

## 2013-12-06 DIAGNOSIS — R109 Unspecified abdominal pain: Secondary | ICD-10-CM

## 2013-12-06 DIAGNOSIS — J189 Pneumonia, unspecified organism: Secondary | ICD-10-CM

## 2013-12-06 DIAGNOSIS — G9340 Encephalopathy, unspecified: Secondary | ICD-10-CM

## 2013-12-06 DIAGNOSIS — I214 Non-ST elevation (NSTEMI) myocardial infarction: Secondary | ICD-10-CM

## 2013-12-06 DIAGNOSIS — B9561 Methicillin susceptible Staphylococcus aureus infection as the cause of diseases classified elsewhere: Secondary | ICD-10-CM

## 2013-12-06 DIAGNOSIS — M75101 Unspecified rotator cuff tear or rupture of right shoulder, not specified as traumatic: Secondary | ICD-10-CM

## 2013-12-06 DIAGNOSIS — Z7982 Long term (current) use of aspirin: Secondary | ICD-10-CM

## 2013-12-06 DIAGNOSIS — Z8679 Personal history of other diseases of the circulatory system: Secondary | ICD-10-CM

## 2013-12-06 DIAGNOSIS — IMO0001 Reserved for inherently not codable concepts without codable children: Secondary | ICD-10-CM

## 2013-12-06 DIAGNOSIS — I5031 Acute diastolic (congestive) heart failure: Secondary | ICD-10-CM

## 2013-12-06 DIAGNOSIS — E876 Hypokalemia: Secondary | ICD-10-CM | POA: Diagnosis present

## 2013-12-06 DIAGNOSIS — A4101 Sepsis due to Methicillin susceptible Staphylococcus aureus: Secondary | ICD-10-CM

## 2013-12-06 DIAGNOSIS — N186 End stage renal disease: Secondary | ICD-10-CM

## 2013-12-06 DIAGNOSIS — R7989 Other specified abnormal findings of blood chemistry: Secondary | ICD-10-CM

## 2013-12-06 DIAGNOSIS — A419 Sepsis, unspecified organism: Secondary | ICD-10-CM

## 2013-12-06 DIAGNOSIS — E039 Hypothyroidism, unspecified: Secondary | ICD-10-CM

## 2013-12-06 DIAGNOSIS — E43 Unspecified severe protein-calorie malnutrition: Secondary | ICD-10-CM

## 2013-12-06 DIAGNOSIS — I4460 Unspecified fascicular block: Secondary | ICD-10-CM | POA: Diagnosis present

## 2013-12-06 DIAGNOSIS — D6959 Other secondary thrombocytopenia: Secondary | ICD-10-CM

## 2013-12-06 DIAGNOSIS — Y831 Surgical operation with implant of artificial internal device as the cause of abnormal reaction of the patient, or of later complication, without mention of misadventure at the time of the procedure: Secondary | ICD-10-CM | POA: Diagnosis present

## 2013-12-06 DIAGNOSIS — I5042 Chronic combined systolic (congestive) and diastolic (congestive) heart failure: Secondary | ICD-10-CM

## 2013-12-06 DIAGNOSIS — R079 Chest pain, unspecified: Secondary | ICD-10-CM

## 2013-12-06 DIAGNOSIS — I447 Left bundle-branch block, unspecified: Secondary | ICD-10-CM

## 2013-12-06 DIAGNOSIS — E873 Alkalosis: Secondary | ICD-10-CM

## 2013-12-06 DIAGNOSIS — I38 Endocarditis, valve unspecified: Secondary | ICD-10-CM

## 2013-12-06 DIAGNOSIS — R45851 Suicidal ideations: Secondary | ICD-10-CM

## 2013-12-06 DIAGNOSIS — Z0389 Encounter for observation for other suspected diseases and conditions ruled out: Secondary | ICD-10-CM

## 2013-12-06 DIAGNOSIS — IMO0002 Reserved for concepts with insufficient information to code with codable children: Secondary | ICD-10-CM

## 2013-12-06 DIAGNOSIS — J111 Influenza due to unidentified influenza virus with other respiratory manifestations: Secondary | ICD-10-CM

## 2013-12-06 DIAGNOSIS — D693 Immune thrombocytopenic purpura: Secondary | ICD-10-CM

## 2013-12-06 LAB — CBC WITH DIFFERENTIAL/PLATELET
Basophils Absolute: 0.1 10*3/uL (ref 0.0–0.1)
Basophils Relative: 1 % (ref 0–1)
Eosinophils Absolute: 0.2 10*3/uL (ref 0.0–0.7)
Eosinophils Relative: 2 % (ref 0–5)
HCT: 32.4 % — ABNORMAL LOW (ref 39.0–52.0)
Hemoglobin: 10 g/dL — ABNORMAL LOW (ref 13.0–17.0)
Lymphocytes Relative: 12 % (ref 12–46)
Lymphs Abs: 0.9 10*3/uL (ref 0.7–4.0)
MCH: 31.3 pg (ref 26.0–34.0)
MCHC: 30.9 g/dL (ref 30.0–36.0)
MCV: 101.3 fL — ABNORMAL HIGH (ref 78.0–100.0)
Monocytes Absolute: 0.7 10*3/uL (ref 0.1–1.0)
Monocytes Relative: 9 % (ref 3–12)
Neutro Abs: 5.6 10*3/uL (ref 1.7–7.7)
Neutrophils Relative %: 76 % (ref 43–77)
Platelets: 259 10*3/uL (ref 150–400)
RBC: 3.2 MIL/uL — ABNORMAL LOW (ref 4.22–5.81)
RDW: 20.5 % — ABNORMAL HIGH (ref 11.5–15.5)
WBC: 7.4 10*3/uL (ref 4.0–10.5)

## 2013-12-06 LAB — COMPREHENSIVE METABOLIC PANEL
ALT: 5 U/L (ref 0–53)
AST: 16 U/L (ref 0–37)
Albumin: 2.4 g/dL — ABNORMAL LOW (ref 3.5–5.2)
Alkaline Phosphatase: 63 U/L (ref 39–117)
BUN: 31 mg/dL — ABNORMAL HIGH (ref 6–23)
CO2: 25 mEq/L (ref 19–32)
Calcium: 9.6 mg/dL (ref 8.4–10.5)
Chloride: 102 mEq/L (ref 96–112)
Creatinine, Ser: 1.45 mg/dL — ABNORMAL HIGH (ref 0.50–1.35)
GFR calc Af Amer: 61 mL/min — ABNORMAL LOW (ref >90)
GFR calc non Af Amer: 52 mL/min — ABNORMAL LOW (ref >90)
Glucose, Bld: 99 mg/dL (ref 70–99)
Potassium: 3.4 mEq/L — ABNORMAL LOW (ref 3.7–5.3)
Sodium: 142 mEq/L (ref 137–147)
Total Bilirubin: 0.3 mg/dL (ref 0.3–1.2)
Total Protein: 7.1 g/dL (ref 6.0–8.3)

## 2013-12-06 LAB — MAGNESIUM: Magnesium: 2 mg/dL (ref 1.5–2.5)

## 2013-12-06 LAB — TROPONIN I: Troponin I: <0.3 ng/mL (ref <0.30)

## 2013-12-06 LAB — PRO B NATRIURETIC PEPTIDE: Pro B Natriuretic peptide (BNP): 30714 pg/mL — ABNORMAL HIGH (ref 0–125)

## 2013-12-06 LAB — I-STAT CG4 LACTIC ACID, ED: Lactic Acid, Venous: 1.05 mmol/L (ref 0.5–2.2)

## 2013-12-06 MED ORDER — FUROSEMIDE 10 MG/ML IJ SOLN
40.0000 mg | Freq: Once | INTRAMUSCULAR | Status: AC
  Administered 2013-12-07: 40 mg via INTRAVENOUS
  Filled 2013-12-06: qty 4

## 2013-12-06 NOTE — ED Provider Notes (Signed)
CSN: [id number]     Arrival date & time 12/06/13  2108 History   First MD Initiated Contact with Patient 12/06/13 2150     Chief Complaint  Patient presents with  . Hypertension  . Fatigue     (Consider location/radiation/quality/duration/timing/severity/associated sxs/prior Treatment) Patient is a 58 y.o. male presenting with shortness of breath. The history is provided by the patient and the spouse.  Shortness of Breath Severity:  Mild Onset quality:  Gradual Duration:  7 days Timing:  Constant Progression:  Worsening Chronicity:  New Relieved by:  Nothing Worsened by:  Nothing tried Ineffective treatments:  None tried Associated symptoms: no cough, no fever and no wheezing   Associated symptoms comment:  Worsening fatigue Risk factors comment:  History of replacement valve endocarditis and septic shock   Past Medical History  Diagnosis Date  . Anxiety   . Depression   . Hypertension   . Hypothyroidism, postsurgical   . Aortic stenosis     s/p Bentall with bioprosthetic AVR 02/2010; Last echo (9/11): Moderate LVH, EF 45-50%, AVR functioning appropriately, aortic valve mean gradient 21, diastolic dysfunction. Chest MRA (2/13): Mild to moderate dilatation at the sinus of Valsalva at 4.1 cm, mild dilatation ascending aorta distal to the tube graft at 3.9 cm, moderate dilatation of the innominate artery a 2.1 cm;    . Hx of cardiac cath     a. LHC in 02/2010: normal cors  . Hx of echocardiogram 2014    Echo (10/14): Severe LVH, EF 60-65%, normal wall motion, grade 1 diastolic dysfunction, AVR functioning normally, mild aortic stenosis (mean 19), moderately dilated aorta, mild LAE, mild RVE  . Staphylococcus aureus bacteremia   . Prosthetic valve endocarditis   . Thyroid cancer   . Heart murmur   . ESRD on dialysis     "[place]; TTS" (10/22/2013)   Past Surgical History  Procedure Laterality Date  . Cardiac catheterization  03/02/2010    NORMAL CORONARY ARTERY  .  Sternotomy      REDO  . Transthoracic echocardiogram  03/2010    SHOWED MILD REDUCTION OF LV FUNCTION  . Thyroidectomy  ~ 2005  . Shoulder arthroscopy w/ rotator cuff repair Right 2012  . Av fistula placement Left 10/08/2013    Procedure: ARTERIOVENOUS (AV) FISTULA CREATION- LEFT ARM; Radial Cephalic ;  Surgeon: Mal [name], MD;  Location: [place];  Service: Vascular;  Laterality: Left;  . Aortic valve replacement  2011  . Cardiac valve replacement    . Aortic valve repair  1968  . Tee without cardioversion N/A 10/24/2013    Procedure: TRANSESOPHAGEAL ECHOCARDIOGRAM (TEE);  Surgeon: Dorothy Spark, MD;  Location: [facility] ENDOSCOPY;  Service: Cardiovascular;  Laterality: N/A;  Deneen Harts procedure N/A 10/28/2013    Procedure: REDO BENTALL PROCEDURE, debridment of aoritc root abscess, replacement of aortic root, ascending aorta and aortic valve with homograft. Insertion of left femoral arterial line;  Surgeon: [name], MD;  Location: [place];  Service: Open Heart Surgery;  Laterality: N/A;  . Intraoperative transesophageal echocardiogram N/A 10/28/2013    Procedure: INTRAOPERATIVE TRANSESOPHAGEAL ECHOCARDIOGRAM;  Surgeon: [name], MD;  Location: [place];  Service: Open Heart Surgery;  Laterality: N/A;   Family History  Problem Relation Age of Onset  . Heart disease Father   . Thyroid cancer Sister   . Thyroid cancer Sister   . Kidney Stones Sister    History  Substance Use Topics  . Smoking status: Never Smoker   .  Smokeless tobacco: Never Used  . Alcohol Use: No     Comment: Occasional    Review of Systems  Constitutional: Negative for fever.  Respiratory: Positive for shortness of breath. Negative for cough and wheezing.   All other systems reviewed and are negative.     Allergies  Oxycodone and Rifampin  Home Medications   Prior to Admission medications   Medication Sig Start Date End Date Taking? Authorizing Provider  amiodarone (PACERONE) 200 MG tablet Take  1 tablet (200 mg total) by mouth daily. 11/13/13  Yes Coolidge Breeze, PA-C  aspirin EC 81 MG tablet Take 81 mg by mouth daily.    Yes Historical Provider, MD  ceFAZolin (ANCEF) 2-3 GM-% SOLR Patent to get 2 grams every 12 hours until June 8th Report CBC, bmet levels to Dr. Lucianne [place] office [phone] 11/28/13  Yes Nita Sells, MD  clonazePAM (KLONOPIN) 1 MG tablet Take 1 mg by mouth 2 (two) times daily as needed for anxiety.   Yes Historical Provider, MD  ferrous [id number] C-folic acid (TRINSICON / FOLTRIN) capsule Take 1 capsule by mouth daily. 11/13/13  Yes Coolidge Breeze, PA-C  levothyroxine (SYNTHROID, LEVOTHROID) 200 MCG tablet Take 200 mcg by mouth daily before breakfast.   Yes Historical Provider, MD  midodrine (PROAMATINE) 5 MG tablet Take 1 tablet (5 mg total) by mouth 2 (two) times daily with a meal. 11/13/13  Yes Gina L Collins, PA-C  mirtazapine (REMERON SOL-TAB) 15 MG disintegrating tablet Take 1 tablet (15 mg total) by mouth at bedtime. 11/13/13  Yes Coolidge Breeze, PA-C  Nutritional Supplements (FEEDING SUPPLEMENT, NEPRO CARB STEADY,) LIQD Take 237 mLs by mouth 2 (two) times daily between meals. 11/13/13  Yes Gina L Collins, PA-C  nystatin (MYCOSTATIN) 500000 UNITS TABS tablet Take 1 tablet (500,000 Units total) by mouth 3 (three) times daily. 11/28/13  Yes Nita Sells, MD  ondansetron (ZOFRAN-ODT) 4 MG disintegrating tablet Take 1 tablet (4 mg total) by mouth every 8 (eight) hours as needed for nausea or vomiting. 11/17/13  Yes [name], MD  pantoprazole (PROTONIX) 40 MG tablet Take 40 mg by mouth at bedtime.   Yes Historical Provider, MD  sertraline (ZOLOFT) 50 MG tablet Take 50 mg by mouth daily.   Yes Historical Provider, MD   BP 153/106  Pulse 97  Temp(Src) 97.8 F (36.6 C) (Oral)  Resp 20  Ht 6\' 1"  (1.854 m)  Wt 186 lb (84.369 kg)  BMI 24.55 kg/m2  SpO2 98% Physical Exam  Constitutional: He is oriented to person, place, and time. He appears  well-developed. He has a sickly appearance. No distress.  HENT:  Head: Normocephalic and atraumatic.  Eyes: Conjunctivae are normal.  Neck: Neck supple. No tracheal deviation present.  Cardiovascular: Normal rate and regular rhythm.   Pulmonary/Chest: Effort normal. No respiratory distress. He has rales in the right middle field, the right lower field, the left middle field and the left lower field.  Abdominal: Soft. He exhibits no distension.  Neurological: He is alert and oriented to person, place, and time. No cranial nerve deficit. GCS eye subscore is 4. GCS verbal subscore is 5. GCS motor subscore is 6.  Skin: Skin is warm. He is diaphoretic. There is pallor.  Psychiatric: He has a normal mood and affect.    ED Course  Procedures (including critical care time) Labs Review Labs Reviewed  COMPREHENSIVE METABOLIC PANEL - Abnormal; Notable for the following:    Potassium 3.4 (*)  BUN 31 (*)    Creatinine, Ser 1.45 (*)    Albumin 2.4 (*)    GFR calc non Af Amer 52 (*)    GFR calc Af Amer 61 (*)    All other components within normal limits  CBC WITH DIFFERENTIAL - Abnormal; Notable for the following:    RBC 3.20 (*)    Hemoglobin 10.0 (*)    HCT 32.4 (*)    MCV 101.3 (*)    RDW 20.5 (*)    All other components within normal limits  PRO B NATRIURETIC PEPTIDE - Abnormal; Notable for the following:    Pro B Natriuretic peptide (BNP) 30714.0 (*)    All other components within normal limits  TROPONIN I  MAGNESIUM  I-STAT CG4 LACTIC ACID, ED    Imaging Review No results found.   EKG Interpretation None      MDM   Final diagnoses:  Pulmonary edema  Aortic valve replaced- Bentall propceedure 2011  Exertional dyspnea  Hypertension complications   58 y.o. male presents with progressively worsening fatigue, shortness of breath, exercise intolerance and general malaise since being discharged from the hospital. He has a history of sepsis secondary to an aVR infection that  required replacement and ongoing IV antibiotics. He has not missed any doses of his Ancef which he takes twice a day a home. He has not spiked any fevers, he has no overlying skin infections that are present in his various surgical sites, and he hasn't had no increased production of sputum. He does have bibasilar rales on exam and appears weak, he is moderately hypertensive which his wife says is worsening over the last few days. There is clinical concern for fluid overload or heart failure exacerbation in this patient with a complex cardiac history. Chest x-ray is consistent with pulmonary edema with no enlargement of the cardiac silhouette apparent from prior studies. The hospitalist was consulted for admission of this patient after a dose of Lasix was ordered and cardiology was contacted for evaluation of the patient and they recommended medicine admission and they will follow along during the hospitalization. EKG was concerning for prolongation of the QTC from prior studies, but electrolytes were within normal limits including magnesium so no acute intervention is warranted. The patient will be kept on telemetry during his hospitalization to monitor for changes.   Leo Grosser, MD 12/07/13 [phone]

## 2013-12-06 NOTE — ED Notes (Signed)
Patient presents with c/o fatigue, had open heart surgery 5-6 weeks ago for an old valve that was infected with staph.  Seen here about 1 week ago with abd pain.  Has a fistula in the left wrist that was used for dialysis which he does not get any more.  Single lumen port to the left chest area and a healing area to the right chest.

## 2013-12-07 DIAGNOSIS — Z9889 Other specified postprocedural states: Secondary | ICD-10-CM

## 2013-12-07 DIAGNOSIS — I472 Ventricular tachycardia, unspecified: Secondary | ICD-10-CM | POA: Diagnosis present

## 2013-12-07 DIAGNOSIS — I10 Essential (primary) hypertension: Secondary | ICD-10-CM

## 2013-12-07 DIAGNOSIS — I428 Other cardiomyopathies: Secondary | ICD-10-CM | POA: Diagnosis present

## 2013-12-07 DIAGNOSIS — Z8679 Personal history of other diseases of the circulatory system: Secondary | ICD-10-CM

## 2013-12-07 DIAGNOSIS — J811 Chronic pulmonary edema: Secondary | ICD-10-CM

## 2013-12-07 DIAGNOSIS — Z954 Presence of other heart-valve replacement: Secondary | ICD-10-CM

## 2013-12-07 DIAGNOSIS — I509 Heart failure, unspecified: Secondary | ICD-10-CM

## 2013-12-07 LAB — CBC
HCT: 32.3 % — ABNORMAL LOW (ref 39.0–52.0)
Hemoglobin: 10 g/dL — ABNORMAL LOW (ref 13.0–17.0)
MCH: 31.3 pg (ref 26.0–34.0)
MCHC: 31 g/dL (ref 30.0–36.0)
MCV: 101.3 fL — ABNORMAL HIGH (ref 78.0–100.0)
Platelets: 264 10*3/uL (ref 150–400)
RBC: 3.19 MIL/uL — ABNORMAL LOW (ref 4.22–5.81)
RDW: 20.4 % — ABNORMAL HIGH (ref 11.5–15.5)
WBC: 7 10*3/uL (ref 4.0–10.5)

## 2013-12-07 LAB — TSH: TSH: 57.74 u[IU]/mL — ABNORMAL HIGH (ref 0.350–4.500)

## 2013-12-07 MED ORDER — FUROSEMIDE 10 MG/ML IJ SOLN
40.0000 mg | Freq: Every day | INTRAMUSCULAR | Status: DC
  Filled 2013-12-07: qty 4

## 2013-12-07 MED ORDER — CLONAZEPAM 1 MG PO TABS
1.0000 mg | ORAL_TABLET | Freq: Two times a day (BID) | ORAL | Status: DC | PRN
  Administered 2013-12-07 – 2013-12-10 (×2): 1 mg via ORAL
  Filled 2013-12-07 (×3): qty 1

## 2013-12-07 MED ORDER — MIRTAZAPINE 15 MG PO TBDP
15.0000 mg | ORAL_TABLET | Freq: Every day | ORAL | Status: DC
  Administered 2013-12-07 – 2013-12-09 (×3): 15 mg via ORAL
  Filled 2013-12-07 (×5): qty 1

## 2013-12-07 MED ORDER — FE FUMARATE-B12-VIT C-FA-IFC PO CAPS
1.0000 | ORAL_CAPSULE | Freq: Every day | ORAL | Status: DC
  Administered 2013-12-07 – 2013-12-10 (×4): 1 via ORAL
  Filled 2013-12-07 (×4): qty 1

## 2013-12-07 MED ORDER — CEFAZOLIN SODIUM-DEXTROSE 2-3 GM-% IV SOLR
2.0000 g | Freq: Two times a day (BID) | INTRAVENOUS | Status: DC
  Administered 2013-12-07: 2 g via INTRAVENOUS
  Filled 2013-12-07 (×3): qty 50

## 2013-12-07 MED ORDER — SODIUM CHLORIDE 0.9 % IJ SOLN: 3.0000 mL | INTRAMUSCULAR | Status: DC | PRN

## 2013-12-07 MED ORDER — ASPIRIN EC 81 MG PO TBEC
81.0000 mg | DELAYED_RELEASE_TABLET | Freq: Every day | ORAL | Status: DC
  Filled 2013-12-07 (×2): qty 1

## 2013-12-07 MED ORDER — MIDODRINE HCL 5 MG PO TABS
5.0000 mg | ORAL_TABLET | Freq: Two times a day (BID) | ORAL | Status: DC
  Administered 2013-12-07 – 2013-12-09 (×5): 5 mg via ORAL
  Filled 2013-12-07 (×7): qty 1

## 2013-12-07 MED ORDER — ENOXAPARIN SODIUM 40 MG/0.4ML ~~LOC~~ SOLN
40.0000 mg | SUBCUTANEOUS | Status: DC
  Administered 2013-12-07 – 2013-12-09 (×3): 40 mg via SUBCUTANEOUS
  Filled 2013-12-07 (×4): qty 0.4

## 2013-12-07 MED ORDER — CLONAZEPAM 1 MG PO TABS
1.0000 mg | ORAL_TABLET | Freq: Every day | ORAL | Status: DC
  Administered 2013-12-07 – 2013-12-09 (×3): 1 mg via ORAL
  Filled 2013-12-07 (×2): qty 1

## 2013-12-07 MED ORDER — CEFAZOLIN SODIUM-DEXTROSE 2-3 GM-% IV SOLR
2.0000 g | Freq: Three times a day (TID) | INTRAVENOUS | Status: DC
  Administered 2013-12-07 – 2013-12-10 (×8): 2 g via INTRAVENOUS
  Filled 2013-12-07 (×10): qty 50

## 2013-12-07 MED ORDER — ASPIRIN EC 81 MG PO TBEC
81.0000 mg | DELAYED_RELEASE_TABLET | Freq: Every day | ORAL | Status: DC
  Administered 2013-12-07 – 2013-12-10 (×4): 81 mg via ORAL
  Filled 2013-12-07 (×4): qty 1

## 2013-12-07 MED ORDER — SERTRALINE HCL 50 MG PO TABS
50.0000 mg | ORAL_TABLET | Freq: Every day | ORAL | Status: DC
  Administered 2013-12-07 – 2013-12-10 (×4): 50 mg via ORAL
  Filled 2013-12-07 (×4): qty 1

## 2013-12-07 MED ORDER — ONDANSETRON 4 MG PO TBDP
4.0000 mg | ORAL_TABLET | Freq: Three times a day (TID) | ORAL | Status: DC | PRN
  Filled 2013-12-07: qty 1

## 2013-12-07 MED ORDER — FUROSEMIDE 10 MG/ML IJ SOLN
40.0000 mg | Freq: Two times a day (BID) | INTRAMUSCULAR | Status: DC
  Administered 2013-12-07 – 2013-12-09 (×5): 40 mg via INTRAVENOUS
  Filled 2013-12-07 (×5): qty 4

## 2013-12-07 MED ORDER — PANTOPRAZOLE SODIUM 40 MG PO TBEC
40.0000 mg | DELAYED_RELEASE_TABLET | Freq: Every day | ORAL | Status: DC
  Administered 2013-12-07 – 2013-12-09 (×3): 40 mg via ORAL
  Filled 2013-12-07 (×3): qty 1

## 2013-12-07 MED ORDER — NEPRO/CARBSTEADY PO LIQD
237.0000 mL | Freq: Two times a day (BID) | ORAL | Status: DC
  Administered 2013-12-07 – 2013-12-10 (×5): 237 mL via ORAL
  Filled 2013-12-07 (×10): qty 237

## 2013-12-07 MED ORDER — NYSTATIN 500000 UNITS PO TABS
500000.0000 [IU] | ORAL_TABLET | Freq: Three times a day (TID) | ORAL | Status: DC
  Administered 2013-12-07 – 2013-12-10 (×10): 500000 [IU] via ORAL
  Filled 2013-12-07 (×12): qty 1

## 2013-12-07 MED ORDER — SODIUM CHLORIDE 0.9 % IJ SOLN: 3.0000 mL | Freq: Two times a day (BID) | INTRAMUSCULAR | Status: DC

## 2013-12-07 MED ORDER — SODIUM CHLORIDE 0.9 % IJ SOLN
10.0000 mL | INTRAMUSCULAR | Status: DC | PRN
  Administered 2013-12-07: 10 mL
  Administered 2013-12-07: 20 mL
  Administered 2013-12-08 – 2013-12-10 (×3): 10 mL

## 2013-12-07 MED ORDER — LEVOTHYROXINE SODIUM 200 MCG PO TABS
200.0000 ug | ORAL_TABLET | Freq: Every day | ORAL | Status: DC
  Filled 2013-12-07 (×3): qty 1

## 2013-12-07 MED ORDER — AMLODIPINE BESYLATE 5 MG PO TABS
5.0000 mg | ORAL_TABLET | Freq: Every day | ORAL | Status: DC
Start: 2013-12-07 — End: 2013-12-09
  Administered 2013-12-07 – 2013-12-09 (×3): 5 mg via ORAL
  Filled 2013-12-07 (×3): qty 1

## 2013-12-07 MED ORDER — AMIODARONE HCL 200 MG PO TABS
200.0000 mg | ORAL_TABLET | Freq: Every day | ORAL | Status: DC
  Administered 2013-12-07 – 2013-12-10 (×4): 200 mg via ORAL
  Filled 2013-12-07 (×4): qty 1

## 2013-12-07 MED ORDER — SODIUM CHLORIDE 0.9 % IV SOLN: 250.0000 mL | INTRAVENOUS | Status: DC | PRN

## 2013-12-07 NOTE — Consult Note (Signed)
Reason for Consult: CHF  Requesting Physician: Dr Grandville Silos  HPI: This is a 58 y.o. male with a history of Aortic valve repair at age 68. In 2011 he underwent tissue AVR and Bentall procedure. In March 2015 he had endocarditis and had redo Bentall using a homograft with debridement of aortic valve abscess by [name]. He had a complicated and prolonged hospitalization from 10/22/13 to 11/09/13. During that time he had been placed on Amio for VT/Vfib. He had normal coronaries in 02/2010 and he had a cardiac Ct on 10/26/13 which revealed normal coronaries. He has required transient dialysis and has an AV fistula but is currently off dialysis. He was admitted 11/23/13 with abdominal pain ultimately felt to be secondary to Rifampin. We were asked to see him then for an elevated Troponin- no further work up was recommended as his Troponin was noted to be elevated chronically going back to Feb 2015. His last TEE was 10/28/13- EF 40-45%.             He is admitted now with increasing SOB. His wgt at discharge 11/28/13 was 185- his admission wgt now is 182. His BNP in the ER was 30k. He has improved after 40 mg of Lasix IV. His wife tells me since he was discharge 5/1/5 his B/P has steadily increased at home with diastolic B?P consistently above 100. He denies excess sodium intake and in fact is quite anorexic having only protein shakes twice a day.   The patient has very limited by mouth intake. When he was in the hospital recently he had abdominal cramping. This improved after rifampin was stopped. However he says that he continues to have abdominal bloating. This may be his symptom of volume overload. There is not a prior history of volume overload.   PMHx:  Past Medical History  Diagnosis Date  . Anxiety   . Depression   . Hypertension   . Hypothyroidism, postsurgical   . Aortic stenosis     s/p Bentall with bioprosthetic AVR 02/2010; Last echo (9/11): Moderate LVH, EF 45-50%, AVR functioning  appropriately, aortic valve mean gradient 21, diastolic dysfunction. Chest MRA (2/13): Mild to moderate dilatation at the sinus of Valsalva at 4.1 cm, mild dilatation ascending aorta distal to the tube graft at 3.9 cm, moderate dilatation of the innominate artery a 2.1 cm;    . Hx of cardiac cath     a. LHC in 02/2010: normal cors  . Hx of echocardiogram 2014    Echo (10/14): Severe LVH, EF 60-65%, normal wall motion, grade 1 diastolic dysfunction, AVR functioning normally, mild aortic stenosis (mean 19), moderately dilated aorta, mild LAE, mild RVE  . Staphylococcus aureus bacteremia   . Prosthetic valve endocarditis   . Thyroid cancer   . Heart murmur   . ESRD on dialysis     "[place]; TTS" (10/22/2013)   Past Surgical History  Procedure Laterality Date  . Cardiac catheterization  03/02/2010    NORMAL CORONARY ARTERY  . Sternotomy      REDO  . Transthoracic echocardiogram  03/2010    SHOWED MILD REDUCTION OF LV FUNCTION  . Thyroidectomy  ~ 2005  . Shoulder arthroscopy w/ rotator cuff repair Right 2012  . Av fistula placement Left 10/08/2013    Procedure: ARTERIOVENOUS (AV) FISTULA CREATION- LEFT ARM; Radial Cephalic ;  Surgeon: Mal [name], MD;  Location: [place];  Service: Vascular;  Laterality: Left;  . Aortic valve replacement  2011  . Cardiac valve  replacement    . Aortic valve repair  1968  . Tee without cardioversion N/A 10/24/2013    Procedure: TRANSESOPHAGEAL ECHOCARDIOGRAM (TEE);  Surgeon: Dorothy Spark, MD;  Location: [facility] ENDOSCOPY;  Service: Cardiovascular;  Laterality: N/A;  Deneen Harts procedure N/A 10/28/2013    Procedure: REDO BENTALL PROCEDURE, debridment of aoritc root abscess, replacement of aortic root, ascending aorta and aortic valve with homograft. Insertion of left femoral arterial line;  Surgeon: [name], MD;  Location: [place];  Service: Open Heart Surgery;  Laterality: N/A;  . Intraoperative transesophageal echocardiogram N/A 10/28/2013     Procedure: INTRAOPERATIVE TRANSESOPHAGEAL ECHOCARDIOGRAM;  Surgeon: [name], MD;  Location: [place];  Service: Open Heart Surgery;  Laterality: N/A;    FAMHx: heart disease-father   SOCHx:  reports that he has never smoked. He has never used smokeless tobacco. He reports that he does not drink alcohol or use illicit drugs.  ALLERGIES: Allergies  Allergen Reactions  . Oxycodone     Gives patient nightmares  . Rifampin Nausea Only    ROS: A comprehensive review of systems was negative. except where noted in HPI  HOME MEDICATIONS: Prescriptions prior to admission  Medication Sig Dispense Refill  . amiodarone (PACERONE) 200 MG tablet Take 1 tablet (200 mg total) by mouth daily.  30 tablet  1  . aspirin EC 81 MG tablet Take 81 mg by mouth daily.       Marland Kitchen ceFAZolin (ANCEF) 2-3 GM-% SOLR Patent to get 2 grams every 12 hours until June 8th Report CBC, bmet levels to Dr. Lucianne Lei Dams office [phone]  10 each  0  . clonazePAM (KLONOPIN) 1 MG tablet Take 1 mg by mouth 2 (two) times daily as needed for anxiety.      . ferrous [id number]-INOMVEH C-folic acid (TRINSICON / FOLTRIN) capsule Take 1 capsule by mouth daily.  30 capsule  1  . levothyroxine (SYNTHROID, LEVOTHROID) 200 MCG tablet Take 200 mcg by mouth daily before breakfast.      . midodrine (PROAMATINE) 5 MG tablet Take 1 tablet (5 mg total) by mouth 2 (two) times daily with a meal.  60 tablet  1  . mirtazapine (REMERON SOL-TAB) 15 MG disintegrating tablet Take 1 tablet (15 mg total) by mouth at bedtime.  30 tablet  1  . Nutritional Supplements (FEEDING SUPPLEMENT, NEPRO CARB STEADY,) LIQD Take 237 mLs by mouth 2 (two) times daily between meals.  28 Can  1  . nystatin (MYCOSTATIN) 500000 UNITS TABS tablet Take 1 tablet (500,000 Units total) by mouth 3 (three) times daily.  42 tablet  0  . ondansetron (ZOFRAN-ODT) 4 MG disintegrating tablet Take 1 tablet (4 mg total) by mouth every 8 (eight) hours as needed for nausea or vomiting.   20 tablet  0  . pantoprazole (PROTONIX) 40 MG tablet Take 40 mg by mouth at bedtime.      . sertraline (ZOLOFT) 50 MG tablet Take 50 mg by mouth daily.        HOSPITAL MEDICATIONS: I have reviewed the patient's current medications.  VITALS: Blood pressure 137/94, pulse 88, temperature 98.2 F (36.8 C), temperature source Oral, resp. rate 20, height _0  (1.854 m), weight 182 lb (82.555 kg), SpO2 100.00%.  PHYSICAL EXAM: General appearance: alert, cooperative and no distress Neck: JVD noted Lungs: fine basilar crackles bilat Heart: regular rate and rhythm and 2/6 systolic murmur, no AI heard Abdomen: soft, non-tender; bowel sounds normal; no masses,  no organomegaly Extremities: extremities  normal, atraumatic, no cyanosis or edema Pulses: 2+ and symmetric Skin: pale, cool, dry Neurologic: Grossly normal  LABS: Results for orders placed during the hospital encounter of 12/06/13 (from the past 48 hour(s))  COMPREHENSIVE METABOLIC PANEL     Status: Abnormal   Collection Time    12/06/13 10:30 PM      Result Value Ref Range   Sodium 142  137 - 147 mEq/L   Potassium 3.4 (*) 3.7 - 5.3 mEq/L   Chloride 102  96 - 112 mEq/L   CO2 25  19 - 32 mEq/L   Glucose, Bld 99  70 - 99 mg/dL   BUN 31 (*) 6 - 23 mg/dL   Creatinine, Ser 1.45 (*) 0.50 - 1.35 mg/dL   Calcium 9.6  8.4 - 10.5 mg/dL   Total Protein 7.1  6.0 - 8.3 g/dL   Albumin 2.4 (*) 3.5 - 5.2 g/dL   AST 16  0 - 37 U/L   ALT 5  0 - 53 U/L   Alkaline Phosphatase 63  39 - 117 U/L   Total Bilirubin 0.3  0.3 - 1.2 mg/dL   GFR calc non Af Amer 52 (*) >90 mL/min   GFR calc Af Amer 61 (*) >90 mL/min   Comment: (NOTE)     The eGFR has been calculated using the CKD EPI equation.     This calculation has not been validated in all clinical situations.     eGFR's persistently <90 mL/min signify possible Chronic Kidney     Disease.  CBC WITH DIFFERENTIAL     Status: Abnormal   Collection Time    12/06/13 10:30 PM      Result Value Ref  Range   WBC 7.4  4.0 - 10.5 K/uL   RBC 3.20 (*) 4.22 - 5.81 MIL/uL   Hemoglobin 10.0 (*) 13.0 - 17.0 g/dL   HCT 32.4 (*) 39.0 - 52.0 %   MCV 101.3 (*) 78.0 - 100.0 fL   MCH 31.3  26.0 - 34.0 pg   MCHC 30.9  30.0 - 36.0 g/dL   RDW 20.5 (*) 11.5 - 15.5 %   Platelets 259  150 - 400 K/uL   Neutrophils Relative % 76  43 - 77 %   Neutro Abs 5.6  1.7 - 7.7 K/uL   Lymphocytes Relative 12  12 - 46 %   Lymphs Abs 0.9  0.7 - 4.0 K/uL   Monocytes Relative 9  3 - 12 %   Monocytes Absolute 0.7  0.1 - 1.0 K/uL   Eosinophils Relative 2  0 - 5 %   Eosinophils Absolute 0.2  0.0 - 0.7 K/uL   Basophils Relative 1  0 - 1 %   Basophils Absolute 0.1  0.0 - 0.1 K/uL  TROPONIN I     Status: None   Collection Time    12/06/13 10:30 PM      Result Value Ref Range   Troponin I <0.30  <0.30 ng/mL   Comment:            Due to the release kinetics of cTnI,     a negative result within the first hours     of the onset of symptoms does not rule out     myocardial infarction with certainty.     If myocardial infarction is still suspected,     repeat the test at appropriate intervals.  PRO B NATRIURETIC PEPTIDE     Status: Abnormal   Collection Time  12/06/13 10:30 PM      Result Value Ref Range   Pro B Natriuretic peptide (BNP) 30714.0 (*) 0 - 125 pg/mL  MAGNESIUM     Status: None   Collection Time    12/06/13 10:30 PM      Result Value Ref Range   Magnesium 2.0  1.5 - 2.5 mg/dL  I-STAT CG4 LACTIC ACID, ED     Status: None   Collection Time    12/06/13 11:55 PM      Result Value Ref Range   Lactic Acid, Venous 1.05  0.5 - 2.2 mmol/L    EKG: NSR, incomplete LBBB  IMAGING: Dg Chest 2 View  12/07/2013   CLINICAL DATA:  Hypertension.  Fatigue.  EXAM: CHEST  2 VIEW  COMPARISON:  DG CHEST 2 VIEW dated 11/23/2013; DG CHEST 2 VIEW dated 11/07/2013  FINDINGS: Moderate CHF is present. Left IJ central line is present with the tip terminating in the mid SVC. There is bilateral basilar atelectasis, airspace  disease and interstitial pulmonary edema is present diffusely.  IMPRESSION: Moderate CHF.   Electronically Signed   By: Dereck Ligas M.D.   On: 12/07/2013 00:13    IMPRESSION:     CHF exacerbation     At this point the etiology of his CHF is not clear. It is my understanding that he does have mild left ventricular dysfunction. However I do not believe that he's had volume overload before. In addition his oral intake is quite limited. It is possible that his abdominal bloating may be a symptom that he has if he becomes fluid overloaded. The plan for today will be to diuresis him. I will her 2-D echo to reassess LV function. We also need to be sure that there has not been some unexpected change related to his recent aortic valvular surgery.   Active Problems:    AVR- Bentall 2011, re-do March 2015 secondary to valve endocarditis.    Hypertension-recently poor control     It is possible that this is now playing a role with his CHF. 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  Add Norvasc for B/P (? Recent CHF secondary to HTN, diastolic dysfunction). MD to see.   Time Spent Directly with Patient: 50 minutes  Erlene Quan [phone] beeper 12/07/2013, 9:11 AM  Patient seen and examined. I agree with the assessment and plan as detailed above. See also my additional thoughts below.   I have made adjustments  to the note above. It does reflect my thoughts concerning his care. Currently he is volume overloaded. It is not completely clear what this is now happening. His oral intake is very limited. Two-dimensional echo will be done to reassess LV function and valvular function to be sure that there has not been any new unexpected change.  Carlena Bjornstad, MD, [facility] 12/07/2013 1:24 PM

## 2013-12-07 NOTE — Progress Notes (Signed)
Patient C/O SOB.  Lungs clear and diminished bilaterally.  Oxygen 100% on RA and other VSS.  Patient also endorses anxiety and requesting PRN.  MD notified and new orders received.  Will continue to monitor. [place]

## 2013-12-07 NOTE — ED Provider Notes (Signed)
The patient presents with generalized fatigue and some shortness of breath which has been progressive over the last 7 days. On exam the patient does not have any respiratory distress nor does he have any rales, his heart rate is in the normal range at this time. He does have some hypertension but no significant hypoxia. He does have generalized weak appearance, he is slightly pale and diaphoretic.  Labs show creatinine of 1.45, hemoglobin of 10, significantly elevated BNP. He does have pulmonary edema the chest x-ray. He will need to be admitted to the hospital.  I saw and evaluated the patient, reviewed the resident's note and I agree with the findings and plan.   ED ECG REPORT  I personally interpreted this EKG   Date: 12/07/2013   Rate: 87  Rhythm: normal sinus rhythm  QRS Axis: normal  Intervals: QT prolonged  ST/T Wave abnormalities: normal  Conduction Disutrbances:left anterior fascicular block  Narrative Interpretation:   Old EKG Reviewed: unchanged other than prolonged QT   Johnna Acosta, MD 12/07/13 [phone]

## 2013-12-07 NOTE — Progress Notes (Signed)
UR Completed.  Bradley [name] [id number] 12/07/2013

## 2013-12-07 NOTE — ED Notes (Signed)
IV team has been contacted.

## 2013-12-07 NOTE — H&P (Signed)
PCP:   [name], MD   Chief Complaint:  sob  HPI: 58 yo male with complicated history including recent hospitalization dx with endocarditis of his prosethetic aortic valve due to staph bacteremia, had to have repeat valve repair end of march 2015 subsequently had renal failure requiring dialysis briefly.  Now off dialysis.  Was discharged last week.  Is on ancef through a picc line and has not missed any doses.  Denies fevers.  No cough.  Has been very fatigued and sob since he went home.  Has not been eating very well, wife says she is worried about his appetite.  No le swelling /edema.  No chest pain.  No abd pain.  No n/v/d.  No pnd, orthopnea.  His bp has been higher than normal at home, and wife was worried about this so came to ED.  He is not on lasix at home.  No rashes.  We are asked to admit for chf exacerbation.  Vss, oxygen sats normal.  Review of Systems:  Positive and negative as per HPI otherwise all other systems are negative  Past Medical History: Past Medical History  Diagnosis Date  . Anxiety   . Depression   . Hypertension   . Hypothyroidism, postsurgical   . Aortic stenosis     s/p Bentall with bioprosthetic AVR 02/2010; Last echo (9/11): Moderate LVH, EF 45-50%, AVR functioning appropriately, aortic valve mean gradient 21, diastolic dysfunction. Chest MRA (2/13): Mild to moderate dilatation at the sinus of Valsalva at 4.1 cm, mild dilatation ascending aorta distal to the tube graft at 3.9 cm, moderate dilatation of the innominate artery a 2.1 cm;    . Hx of cardiac cath     a. LHC in 02/2010: normal cors  . Hx of echocardiogram 2014    Echo (10/14): Severe LVH, EF 60-65%, normal wall motion, grade 1 diastolic dysfunction, AVR functioning normally, mild aortic stenosis (mean 19), moderately dilated aorta, mild LAE, mild RVE  . Staphylococcus aureus bacteremia   . Prosthetic valve endocarditis   . Thyroid cancer   . Heart murmur   . ESRD on dialysis    "[place]; TTS" (10/22/2013)   Past Surgical History  Procedure Laterality Date  . Cardiac catheterization  03/02/2010    NORMAL CORONARY ARTERY  . Sternotomy      REDO  . Transthoracic echocardiogram  03/2010    SHOWED MILD REDUCTION OF LV FUNCTION  . Thyroidectomy  ~ 2005  . Shoulder arthroscopy w/ rotator cuff repair Right 2012  . Av fistula placement Left 10/08/2013    Procedure: ARTERIOVENOUS (AV) FISTULA CREATION- LEFT ARM; Radial Cephalic ;  Surgeon: Mal [name], MD;  Location: [place];  Service: Vascular;  Laterality: Left;  . Aortic valve replacement  2011  . Cardiac valve replacement    . Aortic valve repair  1968  . Tee without cardioversion N/A 10/24/2013    Procedure: TRANSESOPHAGEAL ECHOCARDIOGRAM (TEE);  Surgeon: Dorothy Spark, MD;  Location[facility] ENDOSCOPY;  Service: Cardiovascular;  Laterality: N/A;  Deneen Harts procedure N/A 10/28/2013    Procedure: REDO BENTALL PROCEDURE, debridment of aoritc root abscess, replacement of aortic root, ascending aorta and aortic valve with homograft. Insertion of left femoral arterial line;  Surgeon: [name], MD;  Location: [place];  Service: Open Heart Surgery;  Laterality: N/A;  . Intraoperative transesophageal echocardiogram N/A 10/28/2013    Procedure: INTRAOPERATIVE TRANSESOPHAGEAL ECHOCARDIOGRAM;  Surgeon: [name], MD;  Location: [place];  Service: Open  Heart Surgery;  Laterality: N/A;    Medications: Prior to Admission medications   Medication Sig Start Date End Date Taking? Authorizing Provider  amiodarone (PACERONE) 200 MG tablet Take 1 tablet (200 mg total) by mouth daily. 11/13/13  Yes Coolidge Breeze, PA-C  aspirin EC 81 MG tablet Take 81 mg by mouth daily.    Yes Historical Provider, MD  ceFAZolin (ANCEF) 2-3 GM-% SOLR Patent to get 2 grams every 12 hours until June 8th Report CBC, bmet levels to Dr. Lucianne Lei Dams office [phone] 11/28/13  Yes Nita Sells, MD  clonazePAM (KLONOPIN) 1 MG tablet Take 1 mg by  mouth 2 (two) times daily as needed for anxiety.   Yes Historical Provider, MD  ferrous [id number] C-folic acid (TRINSICON / FOLTRIN) capsule Take 1 capsule by mouth daily. 11/13/13  Yes Coolidge Breeze, PA-C  levothyroxine (SYNTHROID, LEVOTHROID) 200 MCG tablet Take 200 mcg by mouth daily before breakfast.   Yes Historical Provider, MD  midodrine (PROAMATINE) 5 MG tablet Take 1 tablet (5 mg total) by mouth 2 (two) times daily with a meal. 11/13/13  Yes Gina L Collins, PA-C  mirtazapine (REMERON SOL-TAB) 15 MG disintegrating tablet Take 1 tablet (15 mg total) by mouth at bedtime. 11/13/13  Yes Coolidge Breeze, PA-C  Nutritional Supplements (FEEDING SUPPLEMENT, NEPRO CARB STEADY,) LIQD Take 237 mLs by mouth 2 (two) times daily between meals. 11/13/13  Yes Gina L Collins, PA-C  nystatin (MYCOSTATIN) 500000 UNITS TABS tablet Take 1 tablet (500,000 Units total) by mouth 3 (three) times daily. 11/28/13  Yes Nita Sells, MD  ondansetron (ZOFRAN-ODT) 4 MG disintegrating tablet Take 1 tablet (4 mg total) by mouth every 8 (eight) hours as needed for nausea or vomiting. 11/17/13  Yes [name], MD  pantoprazole (PROTONIX) 40 MG tablet Take 40 mg by mouth at bedtime.   Yes Historical Provider, MD  sertraline (ZOLOFT) 50 MG tablet Take 50 mg by mouth daily.   Yes Historical Provider, MD    Allergies:   Allergies  Allergen Reactions  . Oxycodone     Gives patient nightmares  . Rifampin Nausea Only    Social History:  reports that he has never smoked. He has never used smokeless tobacco. He reports that he does not drink alcohol or use illicit drugs.  Family History: Family History  Problem Relation Age of Onset  . Heart disease Father   . Thyroid cancer Sister   . Thyroid cancer Sister   . Kidney Stones Sister     Physical Exam: Filed Vitals:   12/06/13 2129 12/06/13 2239 12/06/13 2257  BP: 153/106 145/102 145/102  Pulse: 97 84 85  Temp: 97.8 F (36.6 C)    TempSrc: Oral     Resp: 20 24 22   Height: 6\' 1"  (1.854 m)    Weight: 84.369 kg (186 lb)    SpO2: 98% 97% 94%   General appearance: alert, cooperative and no distress Head: Normocephalic, without obvious abnormality, atraumatic Eyes: negative Nose: Nares normal. Septum midline. Mucosa normal. No drainage or sinus tenderness. Neck: no JVD and supple, symmetrical, trachea midline Lungs: clear to auscultation bilaterally Heart: regular rate and rhythm, S1, S2 normal, no murmur, click, rub or gallop Abdomen: soft, non-tender; bowel sounds normal; no masses,  no organomegaly Extremities: extremities normal, atraumatic, no cyanosis or edema Pulses: 2+ and symmetric Skin: Skin color, texture, turgor normal. No rashes or lesions Neurologic: Grossly normal    Labs on Admission:   Recent Labs  12/06/13  2230  NA 142  K 3.4*  CL 102  CO2 25  GLUCOSE 99  BUN 31*  CREATININE 1.45*  CALCIUM 9.6  MG 2.0    Recent Labs  12/06/13 2230  AST 16  ALT 5  ALKPHOS 63  BILITOT 0.3  PROT 7.1  ALBUMIN 2.4*    Recent Labs  12/06/13 2230  WBC 7.4  NEUTROABS 5.6  HGB 10.0*  HCT 32.4*  MCV 101.3*  PLT 259    Recent Labs  12/06/13 2230  TROPONINI <0.30   Radiological Exams on Admission: Dg Chest 2 View  12/07/2013   CLINICAL DATA:  Hypertension.  Fatigue.  EXAM: CHEST  2 VIEW  COMPARISON:  DG CHEST 2 VIEW dated 11/23/2013; DG CHEST 2 VIEW dated 11/07/2013  FINDINGS: Moderate CHF is present. Left IJ central line is present with the tip terminating in the mid SVC. There is bilateral basilar atelectasis, airspace disease and interstitial pulmonary edema is present diffusely.  IMPRESSION: Moderate CHF.   Electronically Signed   By: Dereck Ligas M.D.   On: 12/07/2013 00:13    Assessment/Plan  58 yo male with extensive cardiac history with mild chf exacerbation but also query component of major depression  Principal Problem:   CHF exacerbation-  Place on iv lasix.  chf pathway.  Very mild if any  exacerbation.  Consult cardiology pending.  Will not repeat any echo at this time.    Active Problems:  Stable unless o/w noted.   Hypertension   LBBB (left bundle branch block)   Major depressive disorder, recurrent, severe with psychotic features- hospitalized in Jan 2013-  ??? Recurrence of this possible contributing to his decreased appetite, fatigue, etc.   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

## 2013-12-07 NOTE — ED Notes (Signed)
Report given to the rn on 3w

## 2013-12-07 NOTE — Progress Notes (Signed)
I have seen and assessed patient and agree with Dr Camelia Eng assessment and plan. Patient with complicated cardiac history. Will increase lasix to 40 IV Q12. Will consult with cardiology for further evaluation and recommendations.

## 2013-12-08 ENCOUNTER — Other Ambulatory Visit: Payer: Self-pay | Admitting: Cardiothoracic Surgery

## 2013-12-08 ENCOUNTER — Other Ambulatory Visit: Payer: Self-pay | Admitting: Family Medicine

## 2013-12-08 DIAGNOSIS — E890 Postprocedural hypothyroidism: Secondary | ICD-10-CM

## 2013-12-08 DIAGNOSIS — I5031 Acute diastolic (congestive) heart failure: Secondary | ICD-10-CM

## 2013-12-08 DIAGNOSIS — Z9889 Other specified postprocedural states: Secondary | ICD-10-CM

## 2013-12-08 DIAGNOSIS — I428 Other cardiomyopathies: Secondary | ICD-10-CM

## 2013-12-08 DIAGNOSIS — I369 Nonrheumatic tricuspid valve disorder, unspecified: Secondary | ICD-10-CM

## 2013-12-08 DIAGNOSIS — N179 Acute kidney failure, unspecified: Secondary | ICD-10-CM

## 2013-12-08 LAB — BASIC METABOLIC PANEL
BUN: 29 mg/dL — ABNORMAL HIGH (ref 6–23)
CO2: 26 mEq/L (ref 19–32)
Calcium: 9.3 mg/dL (ref 8.4–10.5)
Chloride: 101 mEq/L (ref 96–112)
Creatinine, Ser: 1.35 mg/dL (ref 0.50–1.35)
GFR calc Af Amer: 66 mL/min — ABNORMAL LOW (ref >90)
GFR calc non Af Amer: 57 mL/min — ABNORMAL LOW (ref >90)
Glucose, Bld: 86 mg/dL (ref 70–99)
Potassium: 3.3 mEq/L — ABNORMAL LOW (ref 3.7–5.3)
Sodium: 145 mEq/L (ref 137–147)

## 2013-12-08 LAB — CBC
HCT: 32.6 % — ABNORMAL LOW (ref 39.0–52.0)
Hemoglobin: 10 g/dL — ABNORMAL LOW (ref 13.0–17.0)
MCH: 31.1 pg (ref 26.0–34.0)
MCHC: 30.7 g/dL (ref 30.0–36.0)
MCV: 101.2 fL — ABNORMAL HIGH (ref 78.0–100.0)
Platelets: 264 10*3/uL (ref 150–400)
RBC: 3.22 MIL/uL — ABNORMAL LOW (ref 4.22–5.81)
RDW: 20.4 % — ABNORMAL HIGH (ref 11.5–15.5)
WBC: 5.9 10*3/uL (ref 4.0–10.5)

## 2013-12-08 LAB — PRO B NATRIURETIC PEPTIDE: Pro B Natriuretic peptide (BNP): 28124 pg/mL — ABNORMAL HIGH (ref 0–125)

## 2013-12-08 MED ORDER — POTASSIUM CHLORIDE CRYS ER 20 MEQ PO TBCR
40.0000 meq | EXTENDED_RELEASE_TABLET | ORAL | Status: AC
  Administered 2013-12-08 (×2): 40 meq via ORAL
  Filled 2013-12-08 (×3): qty 2

## 2013-12-08 MED ORDER — LEVOTHYROXINE SODIUM 25 MCG PO TABS
225.0000 ug | ORAL_TABLET | Freq: Every day | ORAL | Status: DC
  Administered 2013-12-08 – 2013-12-10 (×3): 225 ug via ORAL
  Filled 2013-12-08 (×6): qty 1

## 2013-12-08 NOTE — Progress Notes (Signed)
[facility] PROGRESS NOTE  Bradley [name] [id number] DOB: 1955-08-20 DOA: 12/06/2013 PCP: [name], MD  Assessment/Plan: #1 acute on chronic systolic CHF exacerbation Questionable etiology. Cardiac enzymes negative. TSH elevated. 2-D echo is pending. Clinical improvement. I/O. equal -1625 cc. Continue aspirin, amiodarone, IV Lasix. Per cardiology.  #2 hypothyroidism TSH elevated at 57.74. Patient states he's been compliant with his Synthroid. Increase Synthroid to 250 mcg daily. Patient will need repeat TFT done in 4-6 weeks.  #3 aortic valvular disease status post repair at age 31. Status post aVR and Bentall procedure 2011 with redo in March of 2015 secondary to endocarditis. 2-D echo pending. Per cardiology.  #4 renal insufficiency Improvement with diuresis. Follow.  #5 history of V. tach/V. fib Stable. On amiodarone daily. Per cardiology.  #6 prosthetic valve endocarditis Continue IV Ancef.  #7 hypertension Stable. Continue Norvasc, Lasix. On Midrin.  #8 hypokalemia Secondary to diureses. Repleted.  #9 Maj. depressive disorder/anxiety Patient with a flat affect. Continue Zoloft and Klonopin. Outpatient followup.  #10 prophylaxis PPI for GI prophylaxis. Lovenox for DVT prophylaxis. Code Status: Full Family Communication: Updated patient at bedside. Disposition Plan: Home when medically stable.   Consultants:  Cardiology: Dr. Ron Parker 12/07/2013  Procedures:  Chest x-ray 12/06/2013    Antibiotics:  None  HPI/Subjective: Patient states breathing has improved. Patient states decreased appetite secondary to everything tasted like sand.  Objective: Filed Vitals:   12/08/13 0500  BP: 139/91  Pulse: 88  Temp: 97.7 F (36.5 C)  Resp: 18    Intake/Output Summary (Last 24 hours) at 12/08/13 0736 Last data filed at 12/08/13 0518  Gross per 24 hour  Intake    710 ml  Output   1875 ml  Net  -1165 ml   Filed Weights   12/07/13 0237  12/07/13 0500 12/08/13 0500  Weight: 75.569 kg (166 lb 9.6 oz) 82.555 kg (182 lb) 80.967 kg (178 lb 8 oz)    Exam:   General:  NAD  Cardiovascular: RRR WITH 2/6 SEM. Positive JVD  Respiratory: Bibasilar crackles.  Abdomen: Soft, nontender, nondistended, positive bowel sounds.  Musculoskeletal: No clubbing cyanosis or edema.   Data Reviewed: Basic Metabolic Panel:  Recent Labs Lab 12/06/13 2230 12/08/13 0500  NA 142 145  K 3.4* 3.3*  CL 102 101  CO2 25 26  GLUCOSE 99 86  BUN 31* 29*  CREATININE 1.45* 1.35  CALCIUM 9.6 9.3  MG 2.0  --    Liver Function Tests:  Recent Labs Lab 12/06/13 2230  AST 16  ALT 5  ALKPHOS 63  BILITOT 0.3  PROT 7.1  ALBUMIN 2.4*   No results found for this basename: LIPASE, AMYLASE,  in the last 168 hours No results found for this basename: AMMONIA,  in the last 168 hours CBC:  Recent Labs Lab 12/06/13 2230 12/07/13 1020 12/08/13 0500  WBC 7.4 7.0 5.9  NEUTROABS 5.6  --   --   HGB 10.0* 10.0* 10.0*  HCT 32.4* 32.3* 32.6*  MCV 101.3* 101.3* 101.2*  PLT 259 264 264   Cardiac Enzymes:  Recent Labs Lab 12/06/13 2230  TROPONINI <0.30   BNP (last 3 results)  Recent Labs  09/14/13 0445 12/06/13 2230 12/08/13 0500  PROBNP >70000.0* 30714.0* 28124.0*   CBG: No results found for this basename: GLUCAP,  in the last 168 hours  No results found for this or any previous visit (from the past 240 hour(s)).   Studies: Dg Chest 2 View  12/07/2013   CLINICAL DATA:  Hypertension.  Fatigue.  EXAM: CHEST  2 VIEW  COMPARISON:  DG CHEST 2 VIEW dated 11/23/2013; DG CHEST 2 VIEW dated 11/07/2013  FINDINGS: Moderate CHF is present. Left IJ central line is present with the tip terminating in the mid SVC. There is bilateral basilar atelectasis, airspace disease and interstitial pulmonary edema is present diffusely.  IMPRESSION: Moderate CHF.   Electronically Signed   By: Dereck Ligas M.D.   On: 12/07/2013 00:13    Scheduled Meds: .  amiodarone  200 mg Oral Daily  . amLODipine  5 mg Oral Daily  . aspirin EC  81 mg Oral Daily  . aspirin EC  81 mg Oral Daily  . ceFAZolin  2 g Intravenous 3 times per day  . clonazePAM  1 mg Oral QHS  . enoxaparin (LOVENOX) injection  40 mg Subcutaneous Q24H  . feeding supplement (NEPRO CARB STEADY)  237 mL Oral BID BM  . ferrous Q000111Q C-folic acid  1 capsule Oral Daily  . furosemide  40 mg Intravenous Q12H  . levothyroxine  200 mcg Oral QAC breakfast  . midodrine  5 mg Oral BID WC  . mirtazapine  15 mg Oral QHS  . nystatin  500,000 Units Oral TID  . pantoprazole  40 mg Oral QHS  . potassium chloride  40 mEq Oral Q4H  . 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 If 7PM-7AM, please contact night-coverage at [email or web address], password [facility] 12/08/2013, 7:36 AM  LOS: 2 days

## 2013-12-08 NOTE — Progress Notes (Signed)
OT Cancellation Note  Patient Details Name: Bradley [name] MRN: [id number] DOB: 07/09/1956   Cancelled Treatment:    Reason Eval/Treat Not Completed: Patient at procedure or test/ unavailable  [facility], OTR/L  [phone] 12/08/2013 12/08/2013, 4:15 PM

## 2013-12-08 NOTE — Progress Notes (Signed)
  Echocardiogram 2D Echocardiogram has been performed.  [place] 12/08/2013, 5:04 PM

## 2013-12-08 NOTE — Progress Notes (Signed)
[facility]  Patient Status: Active pt with [facility] up this admission  [facility] is providing the following services: HHRN,MSW,  [facility] services for home IV ABX.  [facility] Team will follow and support transition home when deemed appropriate.   If patient discharges after hours, please call [phone].   Larry Sierras 12/08/2013, 4:59 PM

## 2013-12-08 NOTE — Progress Notes (Addendum)
Subjective: Denis SOB  No CP Objective: Filed Vitals:   12/07/13 1227 12/07/13 1400 12/07/13 2019 12/08/13 0500  BP: 134/96 130/87 134/91 139/91  Pulse:  81 85 88  Temp:  98.1 F (36.7 C) 97.8 F (36.6 C) 97.7 F (36.5 C)  TempSrc:  Oral Oral Oral  Resp:  18 18 18   Height:      Weight:    178 lb 8 oz (80.967 kg)  SpO2: 100% 99% 98% 95%   Weight change: -7 lb 8 oz (-3.402 kg)  Intake/Output Summary (Last 24 hours) at 12/08/13 0938 Last data filed at 12/08/13 0844  Gross per 24 hour  Intake    590 ml  Output   1875 ml  Net  -1285 ml    General: Alert, awake, oriented x3, in no acute distress Neck:  JVP is increased at 9 Heart: Regular rate and rhythm  II/VI systolic murmur at base  No rubs, gallops.  Lungs: Clear to auscultation.  No rales or wheezes. Exemities:  No edema.   Neuro: Grossly intact, nonfocal.  Tele:  SR   Lab Results: Results for orders placed during the hospital encounter of 12/06/13 (from the past 24 hour(s))  CBC     Status: Abnormal   Collection Time    12/07/13 10:20 AM      Result Value Ref Range   WBC 7.0  4.0 - 10.5 K/uL   RBC 3.19 (*) 4.22 - 5.81 MIL/uL   Hemoglobin 10.0 (*) 13.0 - 17.0 g/dL   HCT 32.3 (*) 39.0 - 52.0 %   MCV 101.3 (*) 78.0 - 100.0 fL   MCH 31.3  26.0 - 34.0 pg   MCHC 31.0  30.0 - 36.0 g/dL   RDW 20.4 (*) 11.5 - 15.5 %   Platelets 264  150 - 400 K/uL  TSH     Status: Abnormal   Collection Time    12/07/13  7:00 PM      Result Value Ref Range   TSH 57.740 (*) 0.350 - 4.500 uIU/mL  BASIC METABOLIC PANEL     Status: Abnormal   Collection Time    12/08/13  5:00 AM      Result Value Ref Range   Sodium 145  137 - 147 mEq/L   Potassium 3.3 (*) 3.7 - 5.3 mEq/L   Chloride 101  96 - 112 mEq/L   CO2 26  19 - 32 mEq/L   Glucose, Bld 86  70 - 99 mg/dL   BUN 29 (*) 6 - 23 mg/dL   Creatinine, Ser 1.35  0.50 - 1.35 mg/dL   Calcium 9.3  8.4 - 10.5 mg/dL   GFR calc non Af Amer 57 (*) >90 mL/min   GFR calc Af Amer 66 (*) >90  mL/min  CBC     Status: Abnormal   Collection Time    12/08/13  5:00 AM      Result Value Ref Range   WBC 5.9  4.0 - 10.5 K/uL   RBC 3.22 (*) 4.22 - 5.81 MIL/uL   Hemoglobin 10.0 (*) 13.0 - 17.0 g/dL   HCT 32.6 (*) 39.0 - 52.0 %   MCV 101.2 (*) 78.0 - 100.0 fL   MCH 31.1  26.0 - 34.0 pg   MCHC 30.7  30.0 - 36.0 g/dL   RDW 20.4 (*) 11.5 - 15.5 %   Platelets 264  150 - 400 K/uL  PRO B NATRIURETIC PEPTIDE     Status: Abnormal   Collection  Time    12/08/13  5:00 AM      Result Value Ref Range   Pro B Natriuretic peptide (BNP) 28124.0 (*) 0 - 125 pg/mL    Studies/Results: No results found.  Medications: Reviewed   @PROBHOSP @  1.  CHF  Echo pending  Volume status is up a little  But he is comfortable laying flat.    2.  AV disease  S/p redo Bentall  Echo    3,  HTN  BP is mildly incerased  Improved On midodrine.  May need to back off.     4.  REnal insufficiency  Cr improved.    5  Hypothyroid  TSH is signif increased at 57  This may explain above presentation.  Patient had thyroidectomy  Believes he is taking synthroid.  Have resumed at 200 mcg  Will need to be followed closely.   6.  Hx Vt/VF  On amio 200 mg perday  d   LOS: 2 days   Fay Records 12/08/2013, 9:38 AM

## 2013-12-08 NOTE — Telephone Encounter (Signed)
Last visit 05/07/13  Pt was getting medication at the hospital. Is it okay to refill?

## 2013-12-09 DIAGNOSIS — I959 Hypotension, unspecified: Secondary | ICD-10-CM

## 2013-12-09 DIAGNOSIS — E039 Hypothyroidism, unspecified: Secondary | ICD-10-CM

## 2013-12-09 DIAGNOSIS — I5042 Chronic combined systolic (congestive) and diastolic (congestive) heart failure: Secondary | ICD-10-CM

## 2013-12-09 LAB — BASIC METABOLIC PANEL
BUN: 28 mg/dL — ABNORMAL HIGH (ref 6–23)
CO2: 30 mEq/L (ref 19–32)
Calcium: 9.1 mg/dL (ref 8.4–10.5)
Chloride: 103 mEq/L (ref 96–112)
Creatinine, Ser: 1.32 mg/dL (ref 0.50–1.35)
GFR calc Af Amer: 68 mL/min — ABNORMAL LOW (ref >90)
GFR calc non Af Amer: 59 mL/min — ABNORMAL LOW (ref >90)
Glucose, Bld: 91 mg/dL (ref 70–99)
Potassium: 3.9 mEq/L (ref 3.7–5.3)
Sodium: 146 mEq/L (ref 137–147)

## 2013-12-09 LAB — PRO B NATRIURETIC PEPTIDE: Pro B Natriuretic peptide (BNP): 15245 pg/mL — ABNORMAL HIGH (ref 0–125)

## 2013-12-09 MED ORDER — FUROSEMIDE 40 MG PO TABS
40.0000 mg | ORAL_TABLET | Freq: Every day | ORAL | Status: DC
  Filled 2013-12-09: qty 1

## 2013-12-09 MED ORDER — MIDODRINE HCL 2.5 MG PO TABS
2.5000 mg | ORAL_TABLET | Freq: Two times a day (BID) | ORAL | Status: DC
  Administered 2013-12-09 – 2013-12-10 (×2): 2.5 mg via ORAL
  Filled 2013-12-09 (×4): qty 1

## 2013-12-09 MED ORDER — LISINOPRIL 2.5 MG PO TABS
2.5000 mg | ORAL_TABLET | Freq: Every day | ORAL | Status: DC
  Administered 2013-12-09 – 2013-12-10 (×2): 2.5 mg via ORAL
  Filled 2013-12-09 (×2): qty 1

## 2013-12-09 MED ORDER — METOPROLOL TARTRATE 12.5 MG HALF TABLET
12.5000 mg | ORAL_TABLET | Freq: Two times a day (BID) | ORAL | Status: DC
  Administered 2013-12-09 – 2013-12-10 (×3): 12.5 mg via ORAL
  Filled 2013-12-09 (×4): qty 1

## 2013-12-09 MED ORDER — POTASSIUM CHLORIDE CRYS ER 20 MEQ PO TBCR
20.0000 meq | EXTENDED_RELEASE_TABLET | Freq: Every day | ORAL | Status: DC
  Administered 2013-12-09: 20 meq via ORAL
  Filled 2013-12-09 (×2): qty 1

## 2013-12-09 NOTE — Telephone Encounter (Signed)
Can you please call pt he needs a hospital follow up.

## 2013-12-09 NOTE — Progress Notes (Signed)
[facility] PROGRESS NOTE  Bradley [name] [id number] DOB: [birth year] DOA: 12/06/2013 PCP: [name], MD  Assessment/Plan: #1 acute on chronic systolic CHF exacerbation Questionable etiology. Cardiac enzymes negative. TSH elevated. 2-D echo  with an EF of 20-25% with severe hypokinesis of the anteroseptal myocardial. Pro BNP trending down. Clinical improvement. I/O. equal -2191 cc/24 hours. Continue aspirin, amiodarone, IV Lasix has been changed to oral Lasix. An ACE inhibitor and a beta blocker have been started per cardiology. Cardiology following and appreciate input and recommendations.  #2 hypothyroidism TSH elevated at 57.74. Patient states he's been compliant with his Synthroid. Increased Synthroid to 250 mcg daily. Patient will need repeat TFT done in 4-6 weeks.  #3 aortic valvular disease status post repair at age 18. Status post aVR and Bentall procedure 2011 with redo in March of 2015 secondary to endocarditis. 2-D echo with a depressed ejection fraction however valve is working well. Per cardiology.  #4 renal insufficiency Improvement with diuresis. Follow.  #5 history of V. tach/V. fib Stable. On amiodarone daily. Per cardiology.  #6 prosthetic valve endocarditis Continue IV Ancef.  #7 hypertension Stable. Blood pressure medications have been adjusted per cardiology. Midrin dose has been decreased. Continue Lasix. Lisinopril has been started as well as metoprolol. On Midrin.  #8 hypokalemia Secondary to diureses. Repleted.  #9 Maj. depressive disorder/anxiety Patient with a flat affect. Continue Zoloft and Klonopin. Outpatient followup.  #10 prophylaxis PPI for GI prophylaxis. Lovenox for DVT prophylaxis. Code Status: Full Family Communication: Updated patient at bedside. Disposition Plan: Home when medically stable.   Consultants:  Cardiology: Dr. Ron Parker 12/07/2013  Procedures:  Chest x-ray 12/06/2013  2 D ECHO  12/08/13  Antibiotics:  None  HPI/Subjective: Patient states breathing has improved. Patient states decreased appetite secondary to everything tasted like sand.  Objective: Filed Vitals:   12/09/13 0846  BP:   Pulse: 94  Temp:   Resp:     Intake/Output Summary (Last 24 hours) at 12/09/13 1234 Last data filed at 12/09/13 0810  Gross per 24 hour  Intake    290 ml  Output   2001 ml  Net  -1711 ml   Filed Weights   12/07/13 0500 12/08/13 0500 12/09/13 0638  Weight: 82.555 kg (182 lb) 80.967 kg (178 lb 8 oz) 80.241 kg (176 lb 14.4 oz)    Exam:   General:  NAD  Cardiovascular: RRR WITH 2/6 SEM. Positive JVD  Respiratory: CTAB  Abdomen: Soft, nontender, nondistended, positive bowel sounds.  Musculoskeletal: No clubbing cyanosis or edema.   Data Reviewed: Basic Metabolic Panel:  Recent Labs Lab 12/06/13 2230 12/08/13 0500 12/09/13 0510  NA 142 145 146  K 3.4* 3.3* 3.9  CL 102 101 103  CO2 25 26 30   GLUCOSE 99 86 91  BUN 31* 29* 28*  CREATININE 1.45* 1.35 1.32  CALCIUM 9.6 9.3 9.1  MG 2.0  --   --    Liver Function Tests:  Recent Labs Lab 12/06/13 2230  AST 16  ALT 5  ALKPHOS 63  BILITOT 0.3  PROT 7.1  ALBUMIN 2.4*   No results found for this basename: LIPASE, AMYLASE,  in the last 168 hours No results found for this basename: AMMONIA,  in the last 168 hours CBC:  Recent Labs Lab 12/06/13 2230 12/07/13 1020 12/08/13 0500  WBC 7.4 7.0 5.9  NEUTROABS 5.6  --   --   HGB 10.0* 10.0* 10.0*  HCT 32.4* 32.3* 32.6*  MCV 101.3* 101.3* 101.2*  PLT 259  264 264   Cardiac Enzymes:  Recent Labs Lab 12/06/13 2230  TROPONINI <0.30   BNP (last 3 results)  Recent Labs  09/14/13 0445 12/06/13 2230 12/08/13 0500  PROBNP >70000.0* 30714.0* 28124.0*   CBG: No results found for this basename: GLUCAP,  in the last 168 hours  No results found for this or any previous visit (from the past 240 hour(s)).   Studies: No results  found.  Scheduled Meds: . amiodarone  200 mg Oral Daily  . aspirin EC  81 mg Oral Daily  . ceFAZolin  2 g Intravenous 3 times per day  . clonazePAM  1 mg Oral QHS  . enoxaparin (LOVENOX) injection  40 mg Subcutaneous Q24H  . feeding supplement (NEPRO CARB STEADY)  237 mL Oral BID BM  . ferrous Q000111Q C-folic acid  1 capsule Oral Daily  . [START ON 12/10/2013] furosemide  40 mg Oral Daily  . levothyroxine  225 mcg Oral QAC breakfast  . lisinopril  2.5 mg Oral Daily  . metoprolol tartrate  12.5 mg Oral BID  . midodrine  2.5 mg Oral BID WC  . mirtazapine  15 mg Oral QHS  . nystatin  500,000 Units Oral TID  . pantoprazole  40 mg Oral QHS  . potassium chloride  20 mEq Oral Daily  . 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 If 7PM-7AM, please contact night-coverage at [email or web address], password [facility] 12/09/2013, 12:34 PM  LOS: 3 days

## 2013-12-09 NOTE — Evaluation (Signed)
Occupational Therapy Evaluation Patient Details Name: Bradley [name] MRN: [id number] DOB: [birth year] Today's Date: 12/09/2013    History of Present Illness Pt admitted with SOB and volume overload, CHF. Pt with recent hospitalization in March for endocarditis and bentall procedure with aortic root abscess, anoxic brain injury. Then another admission 4/26-5/1 with Abd pain.    Clinical Impression   Pt is performing bathing and dressing at his min to supervision baseline. Prior to admission he could performing toileting at a modified independent level and stood to shower.  Pt is agreeable to use of a shower seat for safety and energy conservation.   Will follow to practice shower transfers and facilitate return to baseline in standing ADL.    Follow Up Recommendations  No OT follow up    Equipment Recommendations  Tub/shower seat (with a back)    Recommendations for Other Services       Precautions / Restrictions Precautions Precautions: Fall Restrictions Weight Bearing Restrictions: No      Mobility Bed Mobility Overal bed mobility: Modified Independent                Transfers Overall transfer level: Needs assistance Equipment used: Rolling walker (2 wheeled) Transfers: Sit to/from Stand Sit to Stand: Supervision         General transfer comment: for safety and lines    Balance                                            ADL Overall ADL's : Needs assistance/impaired Eating/Feeding: Independent;Sitting   Grooming: Wash/dry hands;Supervision/safety;Standing   Upper Body Bathing: Supervision/ safety;Sitting   Lower Body Bathing: Supervison/ safety;Sit to/from stand   Upper Body Dressing : Set up;Sitting   Lower Body Dressing: Supervision/safety;Sit to/from stand   Toilet Transfer: Supervision/safety;Ambulation   Toileting- Clothing Manipulation and Hygiene: Supervision/safety;Sit to/from stand       Functional mobility during  ADLs: Supervision/safety;Rolling walker General ADL Comments: requires min assist to supervision for self care, at his baseline.  Could benefit from sitting to shower for energy conservation.     Vision                     Perception     Praxis      Pertinent Vitals/Pain VSS, no pain reported     Hand Dominance Right   Extremity/Trunk Assessment Upper Extremity Assessment Upper Extremity Assessment: Overall WFL for tasks assessed   Lower Extremity Assessment Lower Extremity Assessment: Defer to PT evaluation   Cervical / Trunk Assessment Cervical / Trunk Assessment: Normal   Communication Communication Communication: No difficulties   Cognition Arousal/Alertness: Awake/alert Behavior During Therapy: Flat affect Overall Cognitive Status: History of cognitive impairments - at baseline                     General Comments       Exercises       Shoulder Instructions      Home Living Family/patient expects to be discharged to:: Private residence Living Arrangements: Spouse/significant other;Children Available Help at Discharge: Family;Available 24 hours/day Type of Home: House Home Access: Stairs to enter [employer] of Steps: 5 Entrance Stairs-Rails: Can reach both;Right;Left Home Layout: One level     Bathroom Shower/Tub: Walk-in shower;Door   [employer] Toilet: Standard     Home Equipment: Bedside commode;Walker - 2 wheels;Grab bars -  tub/shower;Hand held shower head          Prior Functioning/Environment Level of Independence: Needs assistance  Gait / Transfers Assistance Needed: Ambulated with RW ADL's / Homemaking Assistance Needed: wife has been helping with bathing and dressing since March hospitalization   Comments: Increasing independence in self care is not a goal area for pt.    OT Diagnosis: Generalized weakness   OT Problem List: Decreased activity tolerance;Impaired balance (sitting and/or standing);Decreased  knowledge of use of DME or AE;Cardiopulmonary status limiting activity   OT Treatment/Interventions: Self-care/ADL training;DME and/or AE instruction;Patient/family education;Energy conservation    OT Goals(Current goals can be found in the care plan section) Acute Rehab OT Goals Patient Stated Goal: home  OT Goal Formulation: With patient Time For Goal Achievement: 12/16/13 Potential to Achieve Goals: Good ADL Goals Pt Will Perform Grooming: with modified independence;standing Pt Will Transfer to Toilet: with modified independence;ambulating;bedside commode (over toilet) Pt Will Perform Toileting - Clothing Manipulation and hygiene: with modified independence;sit to/from stand Pt Will Perform Tub/Shower Transfer: Shower transfer;ambulating;shower seat;grab bars;rolling walker Additional ADL Goal #1: Pt will gather items with his walker necessary for ADL modified independently.  OT Frequency: Min 2X/week   Barriers to D/C:            Co-evaluation              End of Session Nurse Communication:  (pt's breathing)  Activity Tolerance: Patient tolerated treatment well Patient left: in bed;with call bell/phone within reach;with bed alarm set;with nursing/sitter in room   Time: [id number] OT Time Calculation (min): 22 min Charges:  OT General Charges $OT Visit: 1 Procedure OT Evaluation $Initial OT Evaluation Tier I: 1 Procedure OT Treatments $Self Care/Home Management : 8-22 mins G-Codes:    Haze Boyden Torez [name] 01-02-14, 10:43 AM

## 2013-12-09 NOTE — Evaluation (Signed)
Physical Therapy Evaluation Patient Details Name: Bradley [name] MRN: [id number] DOB: 25-Nov-1955 Today's Date: 12/09/2013   History of Present Illness  Pt admitted with SOB and volume overload, CHF. Pt with recent hospitalization in March for endocarditis and bentall procedure with aortic root abscess, anoxic brain injury. Then another admission 4/26-5/1 with Abd pain.   Clinical Impression  Pt with flat affect and generally good mobility overall with cues for safety and HEP for continued increased function at home. Pt reports walking 5 min 3x/day with supervision of family. Pt will benefit from acute therapy to maximize mobility and strength to decrease burden of care and recommend daily mobility.     Follow Up Recommendations No PT follow up;Supervision/Assistance - 24 hour    Equipment Recommendations  None recommended by PT    Recommendations for Other Services       Precautions / Restrictions Precautions Precautions: Fall      Mobility  Bed Mobility Overal bed mobility: Modified Independent                Transfers Overall transfer level: Needs assistance   Transfers: Sit to/from Stand Sit to Stand: Supervision         General transfer comment: from bed and toilet supervision for safety and lines  Ambulation/Gait Ambulation/Gait assistance: Supervision Ambulation Distance (Feet): 300 Feet Assistive device: Rolling walker (2 wheeled) Gait Pattern/deviations: Step-through pattern;Decreased stride length   Gait velocity interpretation: Below normal speed for age/gender General Gait Details: cues for posture and looking up as pt maintains downward gaze  Stairs Stairs: Yes Stairs assistance: Supervision Stair Management: One rail Right Number of Stairs: 4 General stair comments: hand held assist and supervision for safety  Wheelchair Mobility    Modified Rankin (Stroke Patients Only)       Balance                                              Pertinent Vitals/Pain No pain HR 93    Home Living Family/patient expects to be discharged to:: Private residence Living Arrangements: Spouse/significant other Available Help at Discharge: Family;Available 24 hours/day Type of Home: House Home Access: Stairs to enter Entrance Stairs-Rails: Can reach both;Right;Left Entrance Stairs-Number of Steps: 5 Home Layout: One level Home Equipment: Shower seat - built in;Bedside commode;Walker - 2 wheels      Prior Function Level of Independence: Needs assistance   Gait / Transfers Assistance Needed: Ambulated with RW  ADL's / Homemaking Assistance Needed: wife has been helping with bathing and dressing since March hospitalization        Hand Dominance        Extremity/Trunk Assessment   Upper Extremity Assessment: Overall WFL for tasks assessed           Lower Extremity Assessment: Overall WFL for tasks assessed      Cervical / Trunk Assessment: Normal  Communication   Communication: No difficulties  Cognition Arousal/Alertness: Awake/alert Behavior During Therapy: Flat affect Overall Cognitive Status: History of cognitive impairments - at baseline                      General Comments      Exercises General Exercises - Lower Extremity Long Arc Quad: AROM;Seated;Both;10 reps Hip Flexion/Marching: AROM;Seated;Both;10 reps      Assessment/Plan    PT Assessment Patient needs continued PT services  PT Diagnosis Difficulty walking;Generalized weakness   PT Problem List Decreased strength;Decreased activity tolerance;Decreased mobility;Decreased knowledge of use of DME  PT Treatment Interventions Therapeutic exercise;DME instruction;Functional mobility training;Patient/family education   PT Goals (Current goals can be found in the Care Plan section) Acute Rehab PT Goals Patient Stated Goal: be able to perform ADLs alone again PT Goal Formulation: With patient Time For Goal Achievement:  12/16/13 Potential to Achieve Goals: Good    Frequency Min 3X/week   Barriers to discharge        Co-evaluation               End of Session Equipment Utilized During Treatment: Gait belt Activity Tolerance: Patient tolerated treatment well Patient left: in bed;with call bell/phone within reach;with bed alarm set Nurse Communication: Mobility status         Time: [id number] PT Time Calculation (min): 20 min   Charges:   PT Evaluation $Initial PT Evaluation Tier I: 1 Procedure PT Treatments $Therapeutic Activity: 8-22 mins   PT G Codes:          Adali Pennings B Ahana Najera 12/09/2013, 9:06 AM  Elwyn Reach, [place]

## 2013-12-09 NOTE — Progress Notes (Signed)
Subjective: Denies CP  No SOB   Objective: Filed Vitals:   12/08/13 1435 12/08/13 1949 12/09/13 0638 12/09/13 0846  BP: 128/85 120/82 137/98   Pulse: 85 83 86 94  Temp: 97.7 F (36.5 C) 98.4 F (36.9 C) 98 F (36.7 C)   TempSrc: Oral Oral Oral   Resp: 17 18 20    Height:      Weight:   176 lb 14.4 oz (80.241 kg)   SpO2: 94% 94% 99%    Weight change: -1 lb 9.6 oz (-0.726 kg)  Intake/Output Summary (Last 24 hours) at 12/09/13 1015 Last data filed at 12/09/13 [id number]  Gross per 24 hour  Intake    170 ml  Output   2601 ml  Net  -2431 ml   Net neg 3.8 L   General: Alert, awake, oriented x3, in no acute distress Neck:  JVP is normal Heart: Regular rate and rhythm, without murmurs, rubs, gallops.  Lungs: Clear to auscultation.  No rales or wheezes. Exemities:  No edema.   Neuro: Grossly intact, nonfocal.   Lab Results: Results for orders placed during the hospital encounter of 12/06/13 (from the past 24 hour(s))  BASIC METABOLIC PANEL     Status: Abnormal   Collection Time    12/09/13  5:10 AM      Result Value Ref Range   Sodium 146  137 - 147 mEq/L   Potassium 3.9  3.7 - 5.3 mEq/L   Chloride 103  96 - 112 mEq/L   CO2 30  19 - 32 mEq/L   Glucose, Bld 91  70 - 99 mg/dL   BUN 28 (*) 6 - 23 mg/dL   Creatinine, Ser 1.32  0.50 - 1.35 mg/dL   Calcium 9.1  8.4 - 10.5 mg/dL   GFR calc non Af Amer 59 (*) >90 mL/min   GFR calc Af Amer 68 (*) >90 mL/min    Studies/Results: No results found.  Medications: REviewed   @PROBHOSP @  5.11  Echo  ------------------------------------------------------------ Study Conclusions  - Left ventricle: The cavity size was normal. Wall thickness was increased in a pattern of mild LVH. Systolic function was severely reduced. The estimated ejection fraction was in the range of 20% to 25%. There is severe hypokinesis of the anteroseptal myocardium. - Aortic valve: A bioprosthesis was present. Valve area: 1.92cm^2(VTI). Valve area:  1.86cm^2 (Vmax). - Mitral valve: Mild regurgitation. - Left atrium: The atrium was mildly dilated. - Right ventricle: The cavity size was mildly dilated. Systolic function was mildly reduced. - Right atrium: The atrium was mildly dilated. - Tricuspid valve: Mild-moderate regurgitation. - Pulmonary arteries: PA peak pressure: 54mm Hg (S).  ------------------------------------------------------------ ------------------------------------------------------------  ------------------------------------------------------------ Left ventricle: The cavity size was normal. Wall thickness was increased in a pattern of mild LVH. Systolic function was severely reduced. The estimated ejection fraction was in the range of 20% to 25%. Regional wall motion abnormalities: There is severe hypokinesis of the anteroseptal myocardium.  ------------------------------------------------------------ Aortic valve: A bioprosthesis was present. Doppler: There was no stenosis. No regurgitation. VTI ratio of LVOT to aortic valve: 0.5. Valve area: 1.92cm^2(VTI). Indexed valve area: 0.94cm^2/m^2 (VTI). Peak velocity ratio of LVOT to aortic valve: 0.49. Valve area: 1.86cm^2 (Vmax). Indexed valve area: 0.91cm^2/m^2 (Vmax). Mean gradient: 61mm Hg (S).  ------------------------------------------------------------ Aorta: The aorta was normal, not dilated, and non-diseased.  ------------------------------------------------------------ Mitral valve: Mildly thickened leaflets . Doppler: Mild regurgitation. Peak gradient: 28mm Hg (D).  ------------------------------------------------------------ Left atrium: The atrium was mildly dilated.  ------------------------------------------------------------ Right ventricle:  The cavity size was mildly dilated. Systolic function was mildly reduced.  ------------------------------------------------------------ Pulmonic valve: Structurally normal valve. Cusp separation was  normal. Doppler: Transvalvular velocity was within the normal range. No regurgitation.  ------------------------------------------------------------ Tricuspid valve: Structurally normal valve. Leaflet separation was normal. Doppler: Transvalvular velocity was within the normal range. Mild-moderate regurgitation.  ------------------------------------------------------------ Right atrium: The atrium was mildly dilated.  ------------------------------------------------------------ Pericardium: The pericardium was normal in appearance.   Assessent:   1,  CHF  Patinet had echo yesterday that showed LVEF is down from previous  Results above   May be related to thyroid  I would treat medically  Treat thyoid   Follow with echo in a few months.   Volume status is OK   Modify meds given echo findings  Stop amlodipine  Start metoprolol and ACE I   I would switch to PO lasix with 20 KCL   2.  AV disease  S/p Bentall procedure.  Echo shows valve working well Currently on IV abx  Until June 8th    3.  HTN  BP is a little high  I would back off on midodrine  Follow  May be able to d/c    4.  Hypothyroid  Was supposedly on synthroid  THinks he was taking   Will contiue  Will need close f/u as outpatient.    5.  VT / VF  Continue amio 200    Have made appt with [name] in our clinic on 5/27 at [facility]  Time:  12:10 PM.      LOS: 3 days   Fay Records 12/09/2013, 10:15 AM

## 2013-12-09 NOTE — Telephone Encounter (Signed)
Refill OK.  Needs hospital follow up. Refill for one month

## 2013-12-10 ENCOUNTER — Inpatient Hospital Stay: Payer: Self-pay | Admitting: Infectious Disease

## 2013-12-10 ENCOUNTER — Encounter: Payer: Self-pay | Admitting: Physician Assistant

## 2013-12-10 DIAGNOSIS — F333 Major depressive disorder, recurrent, severe with psychotic symptoms: Secondary | ICD-10-CM

## 2013-12-10 DIAGNOSIS — I5023 Acute on chronic systolic (congestive) heart failure: Secondary | ICD-10-CM

## 2013-12-10 LAB — BASIC METABOLIC PANEL
BUN: 29 mg/dL — ABNORMAL HIGH (ref 6–23)
CO2: 30 mEq/L (ref 19–32)
Calcium: 8.9 mg/dL (ref 8.4–10.5)
Chloride: 102 mEq/L (ref 96–112)
Creatinine, Ser: 1.31 mg/dL (ref 0.50–1.35)
GFR calc Af Amer: 69 mL/min — ABNORMAL LOW (ref >90)
GFR calc non Af Amer: 59 mL/min — ABNORMAL LOW (ref >90)
Glucose, Bld: 88 mg/dL (ref 70–99)
Potassium: 4.7 mEq/L (ref 3.7–5.3)
Sodium: 146 mEq/L (ref 137–147)

## 2013-12-10 LAB — PRO B NATRIURETIC PEPTIDE: Pro B Natriuretic peptide (BNP): 13307 pg/mL — ABNORMAL HIGH (ref 0–125)

## 2013-12-10 MED ORDER — LISINOPRIL 2.5 MG PO TABS: 2.5000 mg | ORAL_TABLET | Freq: Every day | ORAL | Status: DC

## 2013-12-10 MED ORDER — FUROSEMIDE 40 MG PO TABS: 40.0000 mg | ORAL_TABLET | Freq: Every day | ORAL | Status: DC

## 2013-12-10 MED ORDER — POTASSIUM CHLORIDE CRYS ER 20 MEQ PO TBCR
30.0000 meq | EXTENDED_RELEASE_TABLET | Freq: Two times a day (BID) | ORAL | Status: DC
  Administered 2013-12-10: 30 meq via ORAL
  Filled 2013-12-10 (×2): qty 1

## 2013-12-10 MED ORDER — POTASSIUM CHLORIDE CRYS ER 15 MEQ PO TBCR: 30.0000 meq | EXTENDED_RELEASE_TABLET | Freq: Every day | ORAL | Status: DC

## 2013-12-10 MED ORDER — FUROSEMIDE 10 MG/ML IJ SOLN
60.0000 mg | Freq: Once | INTRAMUSCULAR | Status: AC
Start: 2013-12-10 — End: 2013-12-10
  Administered 2013-12-10: 60 mg via INTRAVENOUS
  Filled 2013-12-10: qty 6

## 2013-12-10 MED ORDER — LEVOTHYROXINE SODIUM 75 MCG PO TABS: 225.0000 ug | ORAL_TABLET | Freq: Every day | ORAL | Status: DC

## 2013-12-10 MED ORDER — METOPROLOL TARTRATE 12.5 MG HALF TABLET: 12.5000 mg | ORAL_TABLET | Freq: Two times a day (BID) | ORAL | Status: DC

## 2013-12-10 NOTE — Care Management Note (Signed)
    Page 1 of 1   12/10/2013     12:14:37 PM CARE MANAGEMENT NOTE 12/10/2013  Patient:  Bradley [name], Bradley [name]   Account Number:  [id number]  Date Initiated:  12/10/2013  Documentation initiated by:  Marvetta Gibbons  Subjective/Objective Assessment:   Pt admitted with CHF     Action/Plan:   PTA pt lived at home with wife- was active with [facility] for IV abx- will need resumption orders at time of discharge   Anticipated DC Date:  12/10/2013   Anticipated DC Plan:  [place]  CM consult      [facility] Choice  Resumption Of Svcs/PTA Provider   Choice offered to / List presented to:     DME arranged  TUB BENCH        [place] arranged  HH-1 RN  IV Antibiotics      [facility].   Status of service:  Completed, signed off Medicare Important Message given?   (If response is "NO", the following Medicare IM given date fields will be blank) Date Medicare IM given:   Date Additional Medicare IM given:    Discharge Disposition:  Halfway  Per UR Regulation:  Reviewed for med. necessity/level of care/duration of stay  If discussed at [place] of Stay Meetings, dates discussed:    Comments:  12/10/13- 29- Marvetta Gibbons RN, BSN [phone] Pt for d/c today- Pam with [facility] has needed orders for resumption of home IV abx- pt wound like tub bench for home but does not want to wait for [facility] to deliver DME to room for discharge- printed order given to wife to take to local DME store to buy.

## 2013-12-10 NOTE — Progress Notes (Signed)
Patient discharged to home.  Patient alert, oriented, verbally responsive, breathing regular and non-labored throughout, no s/s of distress noted throughout, no c/o pain throughout.  Discharge instructions verbalized to patient and daughter at bedside.  Both verbalized understanding throughout.  Patient left unit per wheelchair accompanied by transportation and daughter.  VS WNL  Dirk Dress ]12/10/2013

## 2013-12-10 NOTE — Progress Notes (Signed)
Subjective: Denies CP  Breathing is OK   Objective: Filed Vitals:   12/09/13 1240 12/09/13 1413 12/09/13 2121 12/10/13 0558  BP: 129/87 123/82 116/91 119/80  Pulse: 87 85 82 80  Temp: 98.3 F (36.8 C) 97.8 F (36.6 C)  98.4 F (36.9 C)  TempSrc: Oral Oral  Oral  Resp: 20 20  20   Height:      Weight:    181 lb 6.4 oz (82.283 kg)  SpO2: 98% 97%  96%   Weight change: 4 lb 8 oz (2.041 kg)  Intake/Output Summary (Last 24 hours) at 12/10/13 0630 Last data filed at 12/09/13 1419  Gross per 24 hour  Intake    720 ml  Output   1400 ml  Net   -680 ml   Net since admit -3.8  General: Alert, awake, oriented x3, in no acute distress Neck:  JVP is normal Heart: Regular rate and rhythm, without murmurs, rubs, gallops.  Lungs: Clear to auscultation. Rales at bases Exemities:  No edema.   Neuro: Grossly intact, nonfocal.  Tele:  SR  Lab Results: Results for orders placed during the hospital encounter of 12/06/13 (from the past 24 hour(s))  PRO B NATRIURETIC PEPTIDE     Status: Abnormal   Collection Time    12/09/13  2:40 PM      Result Value Ref Range   Pro B Natriuretic peptide (BNP) 15245.0 (*) 0 - 125 pg/mL    Studies/Results: No results found.  Medications: REviewed   @PROBHOSP @  1  CHF  Patient has diuresed some but  Pro BNP is signif elevated still.  Patinet is comfortable I would give 1 more dose of IV lasix then can switch to PO Supplemtn K  2.  Endocarditis Continue ABX  3  HTN  Better  Will follow as outpatient  Esp with midodrine use.  May be able to d/c midodrine  Have tapered  4.  Hypothyorid  Need to follow closely  Overall, need to follow compliance closely as outpatient   Appt made in our clnic for 5/27.  I will try to get sooner.   I think he is OK for d/c after lasix given  Will f/u as outpatient.    LOS: 4 days   Fay Records 12/10/2013, 6:30 AM

## 2013-12-10 NOTE — Discharge Summary (Addendum)
Physician Discharge Summary  RYATT CORSINO XIP:[id number] DOB: 04/25/1956 DOA: 12/06/2013  PCP: [name], MD  Admit date: 12/06/2013 Discharge date: 12/10/2013  Time spent: >35 minutes  Recommendations for Outpatient Follow-up:  [place] on IV atx to cont as scheduled  F/u with cardiology on 5/27 F/u with PCP in 1 week  F/u with ID as scheduled  Discharge Diagnoses:  Principal Problem:   CHF exacerbation Active Problems:   HYPOTHYROIDISM- TSH 09 Sep 2013   AVR- Bentall 2011, re-do March 2015 secondary to valve endocarditis   Hypertension-recently poor control   LBBB (left bundle branch block)   Major depressive disorder, recurrent, severe with psychotic features- hospitalized in Jan 2013   Generalized anxiety disorder   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   Prosthetic valve endocarditis   ESRD transient dialysis, SCr now 1.45   Chronic combined systolic and diastolic CHF (congestive heart failure)   Normal coronary arteries   Elevated troponin- chronic- normal coronaries   H/O aortic valve repair- 1968   VT- placed on Amiodarone March 2015   Non-ischemic cardiomyopathy- EF 40-45% - TEE 10/28/13   Discharge Condition: stable   Diet recommendation: heart healthy   Filed Weights   12/08/13 0500 12/09/13 0638 12/10/13 0558  Weight: 80.967 kg (178 lb 8 oz) 80.241 kg (176 lb 14.4 oz) 82.283 kg (181 lb 6.4 oz)    History of present illness:   58 y/o ? known ESRD patient (TTS HD) ,aortic valve replacement at age 58-developed Aortic aneurysm Bentall Procedure + Av prosthesis- in 08/2013 with MSSA bacteremia with AV endocarditis and septic emboli causing embolic strokes. Re-admitted 10/18/13 c L STclavic pain-? Osteo- underwent 3rd Bentall 6/73/41-[id number] course complicate dby VDRF, VT-noted normal coronaries 02/2010 and Cardiac Ct 10/26/13 normal coronaries. currently on Ancef and Rifampin through 12/31/13 thyroid cancer s/p thyroidectomy  Is presented with SOB, admitted for CHF   Hospital  Course:  1. acute on chronic systolic CHF exacerbation  Questionable etiology. Cardiac enzymes negative. TSH elevated. 2-D echo with an EF of 20-25% with severe hypokinesis of the anteroseptal myocardial.  -clinically imrpoved on IV diuresis, lung CTA BL;   -An ACE inhibitor and a beta blocker have been started per cardiology. Okay to d/c per cards, f/u on 5/27  2.  hypothyroidism  TSH elevated at 57.74. Patient states he's been compliant with his Synthroid. Increased Synthroid to 250 mcg daily. Patient will need repeat TFT done in 4-6 weeks.  3. aortic valvular disease status post repair at age 58.  Status post aVR and Bentall procedure 2011 with redo in March of 2015 secondary to endocarditis.  2-D echo with a depressed ejection fraction however valve is working well. Per cardiology.  4. renal insufficiency  Improvement with diuresis. Follow. OP 5. history of V. tach/V. fib  Stable. On amiodarone daily. Per cardiology.  6. prosthetic valve endocarditis  Continue IV Ancef to complete by June 3rd; ID f/u  7. hypertension  Stable. Blood pressure medications have been adjusted per cardiology. Midrin dose has been decreased. Continue Lasix. Lisinopril has been started as well as metoprolol. On Midrin.  8. hypokalemia  Secondary to diureses. Repleted.  9. Maj. depressive disorder/anxiety  Patient with a flat affect. Continue Zoloft and Klonopin. Outpatient followup.   Procedures:  Echo;  (i.e. Studies not automatically included, echos, thoracentesis, etc; not x-rays)  Consultations:  Cardiology   Discharge Exam: Filed Vitals:   12/10/13 0558  BP: 119/80  Pulse: 80  Temp: 98.4 F (36.9 C)  Resp: 20  General: alert Cardiovascular: s1,s2 rrr Respiratory: CTA BL  Discharge Instructions  Discharge Orders   Future Appointments Provider Department Dept Phone   12/10/2013 2:45 PM Truman Hayward, MD [facility] [phone]   12/11/2013  10:00 AM [name], MD [facility] [phone]   12/24/2013 12:10 PM Sharmon Revere [place] Office [phone]   02/10/2014 2:00 PM [facility] [place] [facility] [address] [phone]   02/10/2014 3:15 PM Mal [name], MD [facility] [phone]   Future Orders Complete By Expires   Diet - low sodium heart healthy  As directed    Discharge instructions  As directed    Increase activity slowly  As directed        Medication List         amiodarone 200 MG tablet  Commonly known as:  PACERONE  Take 1 tablet (200 mg total) by mouth daily.     aspirin EC 81 MG tablet  Take 81 mg by mouth daily.     ceFAZolin 2-3 GM-% Solr  Commonly known as:  ANCEF  Patent to get 2 grams every 12 hours until June 8th Report CBC, bmet levels to Dr. Lucianne Lei [place] office [phone]     clonazePAM 1 MG tablet  Commonly known as:  KLONOPIN  TAKE ONE TABLET BY MOUTH TWICE DAILY AS NEEDED FOR ANXIETY     feeding supplement (NEPRO CARB STEADY) Liqd  Take 237 mLs by mouth 2 (two) times daily between meals.     ferrous [id number] C-folic acid capsule  Commonly known as:  TRINSICON / FOLTRIN  Take 1 capsule by mouth daily.     furosemide 40 MG tablet  Commonly known as:  LASIX  Take 1 tablet (40 mg total) by mouth daily.  Start taking on:  12/11/2013     levothyroxine 200 MCG tablet  Commonly known as:  SYNTHROID, LEVOTHROID  Take 200 mcg by mouth daily before breakfast.     lisinopril 2.5 MG tablet  Commonly known as:  PRINIVIL,ZESTRIL  Take 1 tablet (2.5 mg total) by mouth daily.     metoprolol tartrate 12.5 mg Tabs tablet  Commonly known as:  LOPRESSOR  Take 0.5 tablets (12.5 mg total) by mouth 2 (two) times daily.     midodrine 5 MG tablet  Commonly known as:  PROAMATINE  Take 1 tablet (5 mg total) by mouth 2 (two) times daily with a meal.     mirtazapine 15 MG  disintegrating tablet  Commonly known as:  REMERON SOL-TAB  Take 1 tablet (15 mg total) by mouth at bedtime.     nystatin 500000 UNITS Tabs tablet  Commonly known as:  MYCOSTATIN  Take 1 tablet (500,000 Units total) by mouth 3 (three) times daily.     ondansetron 4 MG disintegrating tablet  Commonly known as:  ZOFRAN-ODT  Take 1 tablet (4 mg total) by mouth every 8 (eight) hours as needed for nausea or vomiting.     pantoprazole 40 MG tablet  Commonly known as:  PROTONIX  Take 40 mg by mouth at bedtime.     potassium chloride SA 15 MEQ tablet  Commonly known as:  KLOR-CON M15  Take 2 tablets (30 mEq total) by mouth daily.     sertraline 50 MG tablet  Commonly known as:  ZOLOFT  Take 50 mg by mouth daily.  Allergies  Allergen Reactions  . Oxycodone     Gives patient nightmares  . Rifampin Nausea Only       Follow-up Information   Follow up with Richardson Dopp, PA-C On 12/24/2013. (12:10pm)    Specialty:  Physician Assistant   Contact information:   [name] [address] [phone]       Follow up with [name], MD In 1 week.   Specialty:  Family Medicine   Contact information:   [place] [address] [phone]        The results of significant diagnostics from this hospitalization (including imaging, microbiology, ancillary and laboratory) are listed below for reference.    Significant Diagnostic Studies: Ct Abdomen Pelvis Wo Contrast  11/23/2013   CLINICAL DATA:  One week history of umbilical pain, cramping and nausea.  EXAM: CT ABDOMEN AND PELVIS WITHOUT CONTRAST  TECHNIQUE: Multidetector CT imaging of the abdomen and pelvis was performed following the standard protocol without intravenous contrast.  COMPARISON:  Most recent prior CT abdomen/ pelvis 09/06/2013  FINDINGS: Lower Chest: Small bilateral layering pleural effusions. Mild dependent atelectasis in the right lower lobe. Significant  atelectasis and possible consolidation in the visualized left lower lobe. Unchanged mild cardiomegaly. No pericardial effusion. Unremarkable distal thoracic esophagus.  Abdomen: Unenhanced CT was performed per clinician order. Lack of IV contrast limits sensitivity and specificity, especially for evaluation of abdominal/pelvic solid viscera. Within these limitations, unremarkable CT appearance of the stomach, duodenum, spleen, adrenal glands and pancreas. Normal hepatic contour morphology. Dystrophic calcification in the posterior right hemi liver is unchanged compared to prior. No discrete hepatic lesion. Geographic hypoattenuation in the left hemi-liver adjacent to the fissure for the falciform ligament is nonspecific but most suggestive of benign focal fatty infiltration. Gallbladder is unremarkable. No intra or extrahepatic biliary ductal dilatation.  No hydronephrosis. 3 mm nonobstructing stone in the lower pole of the right kidney. High attenuation foci within the left kidney likely represent small hemorrhagic cysts.  No evidence of obstruction or focal bowel wall thickening. Normal appendix in the right lower quadrant. The terminal ileum is unremarkable.  Pelvis: Small amount of intermediate attenuation ascites in the right lateral inguinal recess and with in the pelvis. Unremarkable appearance of the seminal vesicles. Mild prostatomegaly. Normal appearance of the bladder. Right indirect inguinal hernia containing omental fat and a small amount of pelvic ascites. Interval development of right inguinal masses compared to the prior study. The largest node measures 1.7 x 3.2 cm. There is associated overlying skin thickening. A second superficial nodular opacity measures 1.7 cm in diameter. There are new surgical clips overlying the right common femoral vessels immediately subjacent to the nodular opacity.  Bones/Soft Tissues: No acute fracture or aggressive appearing lytic or blastic osseous lesion.  Vascular:  Atherosclerotic vascular disease without significant stenosis or aneurysmal dilatation.  IMPRESSION: 1. Small amount of intermediate attenuation ascites in the right lateral inguinal recess and deep anatomic pelvis of on certain etiology. No evidence of bowel obstruction or focal bowel wall thickening. And occult infectious/inflammatory enteritis or colitis is a consideration. 2. Right direct inguinal hernia containing omental fat and a small amount of ascitic fluid. 3. Interval development of nodular masslike densities in the right superficial inguinal region overlying new surgical clips an the common femoral vessels. Query clinical history of recent common femoral endarterectomy or attempted femoral loop graft placement? Given the presence of the surgical clips, the collection is favored to reflect a postoperative  seroma or lymphocele. In the appropriate clinical setting, abscess, hematoma or lymphadenopathy are additional possibilities. 4. Left lower lobe atelectasis and possible consolidation. Left lower lobe pneumonia difficult to exclude. 5. Small bilateral layering pleural effusions. 6. Punctate nonobstructing stone in the lower pole of the right kidney. 7. Atherosclerotic vascular calcifications.   Electronically Signed   By: Jacqulynn Cadet M.D.   On: 11/23/2013 13:39   Dg Chest 2 View  12/07/2013   CLINICAL DATA:  Hypertension.  Fatigue.  EXAM: CHEST  2 VIEW  COMPARISON:  DG CHEST 2 VIEW dated 11/23/2013; DG CHEST 2 VIEW dated 11/07/2013  FINDINGS: Moderate CHF is present. Left IJ central line is present with the tip terminating in the mid SVC. There is bilateral basilar atelectasis, airspace disease and interstitial pulmonary edema is present diffusely.  IMPRESSION: Moderate CHF.   Electronically Signed   By: Dereck Ligas M.D.   On: 12/07/2013 00:13   Dg Chest 2 View  11/23/2013   CLINICAL DATA:  Mid chest pain and shortness of breath. History of open-heart surgery 4 weeks ago.  EXAM: CHEST  2  VIEW  COMPARISON:  11/07/2013  FINDINGS: Left jugular catheter has been removed. Right jugular dual-lumen central venous catheter remains in place with tips overlying the right atrium, unchanged. The cardiac silhouette remains enlarged. There is improved aeration of the left lower lobe. There is a small left pleural effusion, decreased from prior. No definite right pleural effusion is identified. There is mild pulmonary vascular congestion without overt edema. No pneumothorax is seen. No acute osseous abnormality is identified.  IMPRESSION: 1. Cardiomegaly with pulmonary vascular congestion. 2. Small left pleural effusion, decreased from prior. 3. Improved aeration of the left lower lobe. 4. Interval removal of left jugular catheter.   Electronically Signed   By: Logan Bores   On: 11/23/2013 11:58   Ct Head Wo Contrast  11/23/2013   CLINICAL DATA:  Generalized weakness  EXAM: CT HEAD WITHOUT CONTRAST  TECHNIQUE: Contiguous axial images were obtained from the base of the skull through the vertex without intravenous contrast.  COMPARISON:  Prior head CT 11/07/2013  FINDINGS: Negative for acute intracranial hemorrhage, acute infarction, mass, mass effect, hydrocephalus or midline shift. Gray-white differentiation is preserved throughout. Stable focal hypoattenuation in the right centrum semiovale and left parietal subcortical and deep white matter. Globes and orbits are symmetric and unremarkable bilaterally. No focal soft tissue or calvarial abnormality. Normal aeration of the mastoid air cells and paranasal sinuses. .  IMPRESSION: 1. No acute intracranial abnormality. 2. No significant interval change in the appearance of the brain compared to recent prior imaging from 11/07/2013.   Electronically Signed   By: Jacqulynn Cadet M.D.   On: 11/23/2013 13:42   Ir Removal Tun Cv Cath W/o Fl  11/28/2013   CLINICAL DATA:  Chronic renal failure. No longer requiring tunneled HD catheter. Request removal  EXAM: REMOVAL  TUNNELED CENTRAL VENOUS CATHETER  PROCEDURE: The patient's right chest and catheter was prepped and draped in a normal sterile fashion. Heparin was removed from both ports of catheter. 1% lidocaine was used for local anesthesia. Using gentle blunt dissection the cuff of the catheter was exposed and the catheter was removed in it's entirety. Pressure was held till hemostasis was obtained. A sterile dressing was applied. The patient tolerated the procedure well with no immediate complications.  COMPLICATIONS: None immediate.  IMPRESSION: Successful catheter removal as described above.  Read by: Ascencion Dike PA-C   Electronically Signed  By: Jacqulynn Cadet M.D.   On: 11/28/2013 14:07   Ir US Guide Vasc Access Left  11/28/2013   INDICATION: 58 year old male with history of peri valvular abscess in need of durable IV access for outpatient intravenous antibiotic therapy.  EXAM: TUNNELED CENTRAL VENOUS HEMODIALYSIS CATHETER PLACEMENT WITH ULTRASOUND AND FLUOROSCOPIC GUIDANCE  MEDICATIONS: None  CONTRAST:  None  ANESTHESIA/SEDATION: None required  FLUOROSCOPY TIME:  12 seconds  COMPLICATIONS: None immediate  PROCEDURE: Informed written consent was obtained from the patient after a discussion of the risks, benefits, and alternatives to treatment. Questions regarding the procedure were encouraged and answered. The left neck and chest were prepped with chlorhexidine in a sterile fashion, and a sterile drape was applied covering the operative field. Maximum barrier sterile technique with sterile gowns and gloves were used for the procedure. A timeout was performed prior to the initiation of the procedure.  After creating a small venotomy incision, a micropuncture kit was utilized to access the left vein under direct, real-time ultrasound guidance after the overlying soft tissues were anesthetized with 1% lidocaine with epinephrine. Ultrasound image documentation was performed. The microwire was kinked to measure  appropriate catheter length. A stiff glidewire was advanced to the level of the IVC and the micropuncture sheath was exchanged for a peel-away sheath. A 23 cm tunneled power PICC was tunneled in a retrograde fashion from the anterior chest wall to the venotomy incision.  The catheter was then placed through the peel-away sheath with tip ultimately positioned within the superior aspect of the right atrium. Final catheter positioning was confirmed and documented with a spot radiographic image. The catheter aspirates and flushes normally. The catheter was flushed with appropriate volume heparin dwells.  The catheter exit site was secured with a 0-Prolene retention suture. The venotomy incision was closed with Dermabond. Dressings were applied. The patient tolerated the procedure well without immediate post procedural complication.  IMPRESSION: Successful placement of 23 cm tip to cuff tunneled power PICC via the left vein with tip terminating within the superior aspect of the right atrium. The catheter is ready for immediate use.  Signed,  Criselda Peaches, MD  [facility]  [facility]   Electronically Signed   By: Jacqulynn Cadet M.D.   On: 11/28/2013 15:28   Ir Fluoro Guide Cv Midline Picc Left  11/28/2013   INDICATION: 58 year old male with history of peri valvular abscess in need of durable IV access for outpatient intravenous antibiotic therapy.  EXAM: TUNNELED CENTRAL VENOUS HEMODIALYSIS CATHETER PLACEMENT WITH ULTRASOUND AND FLUOROSCOPIC GUIDANCE  MEDICATIONS: None  CONTRAST:  None  ANESTHESIA/SEDATION: None required  FLUOROSCOPY TIME:  12 seconds  COMPLICATIONS: None immediate  PROCEDURE: Informed written consent was obtained from the patient after a discussion of the risks, benefits, and alternatives to treatment. Questions regarding the procedure were encouraged and answered. The left neck and chest were prepped with chlorhexidine in a sterile fashion, and  a sterile drape was applied covering the operative field. Maximum barrier sterile technique with sterile gowns and gloves were used for the procedure. A timeout was performed prior to the initiation of the procedure.  After creating a small venotomy incision, a micropuncture kit was utilized to access the left vein under direct, real-time ultrasound guidance after the overlying soft tissues were anesthetized with 1% lidocaine with epinephrine. Ultrasound image documentation was performed. The microwire was kinked to measure appropriate catheter length. A stiff glidewire was advanced to the level of the IVC and the micropuncture  sheath was exchanged for a peel-away sheath. A 23 cm tunneled power PICC was tunneled in a retrograde fashion from the anterior chest wall to the venotomy incision.  The catheter was then placed through the peel-away sheath with tip ultimately positioned within the superior aspect of the right atrium. Final catheter positioning was confirmed and documented with a spot radiographic image. The catheter aspirates and flushes normally. The catheter was flushed with appropriate volume heparin dwells.  The catheter exit site was secured with a 0-Prolene retention suture. The venotomy incision was closed with Dermabond. Dressings were applied. The patient tolerated the procedure well without immediate post procedural complication.  IMPRESSION: Successful placement of 23 cm tip to cuff tunneled power PICC via the left vein with tip terminating within the superior aspect of the right atrium. The catheter is ready for immediate use.  Signed,  Criselda Peaches, MD  [facility]  [facility]   Electronically Signed   By: Jacqulynn Cadet M.D.   On: 11/28/2013 15:28    Microbiology: No results found for this or any previous visit (from the past 240 hour(s)).   Labs: Basic Metabolic Panel:  Recent Labs Lab 12/06/13 2230 12/08/13 0500  12/09/13 0510 12/10/13 0440  NA 142 145 146 146  K 3.4* 3.3* 3.9 4.7  CL 102 101 103 102  CO2 '25 26 30 30  ' GLUCOSE 99 86 91 88  BUN 31* 29* 28* 29*  CREATININE 1.45* 1.35 1.32 1.31  CALCIUM 9.6 9.3 9.1 8.9  MG 2.0  --   --   --    Liver Function Tests:  Recent Labs Lab 12/06/13 2230  AST 16  ALT 5  ALKPHOS 63  BILITOT 0.3  PROT 7.1  ALBUMIN 2.4*   No results found for this basename: LIPASE, AMYLASE,  in the last 168 hours No results found for this basename: AMMONIA,  in the last 168 hours CBC:  Recent Labs Lab 12/06/13 2230 12/07/13 1020 12/08/13 0500  WBC 7.4 7.0 5.9  NEUTROABS 5.6  --   --   HGB 10.0* 10.0* 10.0*  HCT 32.4* 32.3* 32.6*  MCV 101.3* 101.3* 101.2*  PLT 259 264 264   Cardiac Enzymes:  Recent Labs Lab 12/06/13 2230  TROPONINI <0.30   BNP: BNP (last 3 results)  Recent Labs  12/08/13 0500 12/09/13 1440 12/10/13 0440  PROBNP 28124.0* 15245.0* 13307.0*   CBG: No results found for this basename: GLUCAP,  in the last 168 hours     Signed:  Kinnie Feil  [facility] 12/10/2013, 8:16 AM

## 2013-12-11 ENCOUNTER — Ambulatory Visit: Payer: Medicare Other | Admitting: Cardiothoracic Surgery

## 2013-12-11 MED ORDER — POTASSIUM CHLORIDE CRYS ER 15 MEQ PO TBCR: 30.0000 meq | EXTENDED_RELEASE_TABLET | Freq: Every day | ORAL | Status: DC

## 2013-12-11 MED ORDER — LEVOTHYROXINE SODIUM 75 MCG PO TABS: 225.0000 ug | ORAL_TABLET | Freq: Every day | ORAL | Status: DC

## 2013-12-11 MED ORDER — LISINOPRIL 2.5 MG PO TABS: 2.5000 mg | ORAL_TABLET | Freq: Every day | ORAL | Status: DC

## 2013-12-11 MED ORDER — FUROSEMIDE 40 MG PO TABS: 40.0000 mg | ORAL_TABLET | Freq: Every day | ORAL | Status: DC

## 2013-12-11 MED ORDER — METOPROLOL TARTRATE 12.5 MG HALF TABLET: 12.5000 mg | ORAL_TABLET | Freq: Two times a day (BID) | ORAL | Status: DC

## 2013-12-12 ENCOUNTER — Ambulatory Visit (INDEPENDENT_AMBULATORY_CARE_PROVIDER_SITE_OTHER): Payer: Medicare Other | Admitting: Cardiothoracic Surgery

## 2013-12-12 VITALS — BP 123/84 | HR 79 | Temp 98.5°F | Resp 16 | Ht 73.0 in | Wt 170.0 lb

## 2013-12-12 DIAGNOSIS — Z8679 Personal history of other diseases of the circulatory system: Secondary | ICD-10-CM

## 2013-12-12 DIAGNOSIS — Z09 Encounter for follow-up examination after completed treatment for conditions other than malignant neoplasm: Secondary | ICD-10-CM

## 2013-12-12 NOTE — Progress Notes (Signed)
PCP is [name], MD Referring Provider is Tommy Medal, Lavell Islam, MD  Chief Complaint  Patient presents with  . Routine Post Op    assess R groin    HPI: Patient presents for office evaluation after home health nurse called yesterday evening with report of swallowing in the patient's right groin at the site of femoral cannulation for redo aortic root replacement performed in March for endocarditis. The patient recently was hospitalized on the cardiology service for fluid retention and CHF. He is on antibiotics through a PICC line. He states the groin swelling was worse yesterday than it is today. He states he was up outside on his feet most of the day interacting with new miniature ponies he received as a gift. He denies pain tenderness erythema warmth or drainage from the incision.  On exam there was a well-healed surgical scar in the groin crease. There is some scar tissue over the femoral artery. There is no stroma or evidence of pseudoaneurysm. There is a pulse present. Past Medical History  Diagnosis Date  . Anxiety   . Depression   . Hypertension   . Hypothyroidism, postsurgical   . Aortic stenosis     s/p Bentall with bioprosthetic AVR 02/2010; Last echo (9/11): Moderate LVH, EF 45-50%, AVR functioning appropriately, aortic valve mean gradient 21, diastolic dysfunction. Chest MRA (2/13): Mild to moderate dilatation at the sinus of Valsalva at 4.1 cm, mild dilatation ascending aorta distal to the tube graft at 3.9 cm, moderate dilatation of the innominate artery a 2.1 cm;    . Hx of cardiac cath     a. LHC in 02/2010: normal cors  . Hx of echocardiogram 2014    Echo (10/14): Severe LVH, EF 60-65%, normal wall motion, grade 1 diastolic dysfunction, AVR functioning normally, mild aortic stenosis (mean 19), moderately dilated aorta, mild LAE, mild RVE  . Staphylococcus aureus bacteremia   . Prosthetic valve endocarditis   . Thyroid cancer   . Heart murmur   . ESRD on dialysis    "[place]; TTS" (10/22/2013)    Past Surgical History  Procedure Laterality Date  . Cardiac catheterization  03/02/2010    NORMAL CORONARY ARTERY  . Sternotomy      REDO  . Transthoracic echocardiogram  03/2010    SHOWED MILD REDUCTION OF LV FUNCTION  . Thyroidectomy  ~ 2005  . Shoulder arthroscopy w/ rotator cuff repair Right 2012  . Av fistula placement Left 10/08/2013    Procedure: ARTERIOVENOUS (AV) FISTULA CREATION- LEFT ARM; Radial Cephalic ;  Surgeon: Mal [name], MD;  Location: [facility];  Service: Vascular;  Laterality: Left;  . Aortic valve replacement  2011  . Cardiac valve replacement    . Aortic valve repair  1968  . Tee without cardioversion N/A 10/24/2013    Procedure: TRANSESOPHAGEAL ECHOCARDIOGRAM (TEE);  Surgeon: Dorothy Spark, MD;  Location: [facility] ENDOSCOPY;  Service: Cardiovascular;  Laterality: N/A;  Deneen Harts procedure N/A 10/28/2013    Procedure: REDO BENTALL PROCEDURE, debridment of aoritc root abscess, replacement of aortic root, ascending aorta and aortic valve with homograft. Insertion of left femoral arterial line;  Surgeon: [name], MD;  Location: [facility];  Service: Open Heart Surgery;  Laterality: N/A;  . Intraoperative transesophageal echocardiogram N/A 10/28/2013    Procedure: INTRAOPERATIVE TRANSESOPHAGEAL ECHOCARDIOGRAM;  Surgeon: [name], MD;  Location: [facility];  Service: Open Heart Surgery;  Laterality: N/A;    Family History  Problem Relation Age of Onset  .  Heart disease Father   . Thyroid cancer Sister   . Thyroid cancer Sister   . Kidney Stones Sister     Social History History  Substance Use Topics  . Smoking status: Never Smoker   . Smokeless tobacco: Never Used  . Alcohol Use: No     Comment: Occasional    Current Outpatient Prescriptions  Medication Sig Dispense Refill  . amiodarone (PACERONE) 200 MG tablet Take 1 tablet (200 mg total) by mouth daily.  30 tablet  1  . aspirin EC 81 MG tablet Take 81 mg by mouth  daily.       Marland Kitchen ceFAZolin (ANCEF) 2-3 GM-% SOLR Patent to get 2 grams every 12 hours until June 8th Report CBC, bmet levels to Dr. Lucianne Lei Dams office [phone]  10 each  0  . clonazePAM (KLONOPIN) 1 MG tablet TAKE ONE TABLET BY MOUTH TWICE DAILY AS NEEDED FOR ANXIETY  15 tablet  0  . ferrous Q000111Q C-folic acid (TRINSICON / FOLTRIN) capsule Take 1 capsule by mouth daily.  30 capsule  1  . furosemide (LASIX) 40 MG tablet Take 1 tablet (40 mg total) by mouth daily.  30 tablet  2  . levothyroxine (SYNTHROID, LEVOTHROID) 75 MCG tablet Take 3 tablets (225 mcg total) by mouth daily before breakfast.  30 tablet  0  . lisinopril (PRINIVIL,ZESTRIL) 2.5 MG tablet Take 1 tablet (2.5 mg total) by mouth daily.  30 tablet  0  . metoprolol tartrate (LOPRESSOR) 12.5 mg TABS tablet Take 0.5 tablets (12.5 mg total) by mouth 2 (two) times daily.  60 tablet  2  . midodrine (PROAMATINE) 5 MG tablet Take 1 tablet (5 mg total) by mouth 2 (two) times daily with a meal.  60 tablet  1  . mirtazapine (REMERON SOL-TAB) 15 MG disintegrating tablet Take 1 tablet (15 mg total) by mouth at bedtime.  30 tablet  1  . Nutritional Supplements (FEEDING SUPPLEMENT, NEPRO CARB STEADY,) LIQD Take 237 mLs by mouth 2 (two) times daily between meals.  28 Can  1  . nystatin (MYCOSTATIN) 500000 UNITS TABS tablet Take 1 tablet (500,000 Units total) by mouth 3 (three) times daily.  42 tablet  0  . ondansetron (ZOFRAN-ODT) 4 MG disintegrating tablet Take 1 tablet (4 mg total) by mouth every 8 (eight) hours as needed for nausea or vomiting.  20 tablet  0  . pantoprazole (PROTONIX) 40 MG tablet Take 40 mg by mouth at bedtime.      . potassium chloride SA (KLOR-CON M15) 15 MEQ tablet Take 2 tablets (30 mEq total) by mouth daily.  30 tablet  0  . sertraline (ZOLOFT) 50 MG tablet Take 50 mg by mouth daily.       No current facility-administered medications for this visit.    Allergies  Allergen Reactions  . Oxycodone     Gives patient  nightmares  . Rifampin Nausea Only    Review of Systems patient feeling better after receiving diuresis in the hospital recently  BP 123/84  Pulse 79  Temp(Src) 98.5 F (36.9 C) (Oral)  Resp 16  Ht 6\' 1"  (1.854 m)  Wt 170 lb (77.111 kg)  BMI 22.43 kg/m2  SpO2 97% Physical Exam Alert and comfortable Heart rate regular Breath sounds slightly diminished but clear Sternal incision well-healed Tunneled PICC line left upper chest Right groin incision well-healed with prominent scar tissue which is nontender, without evidence of bruit and with a normal pulse  Diagnostic Tests: None Impression: Prominent  scar in groin incision but otherwise healing well--patient reassured Plan: Return to see [name] as scheduled on May 19 for postoperative check

## 2013-12-16 ENCOUNTER — Ambulatory Visit: Payer: Medicaid Other | Admitting: Cardiothoracic Surgery

## 2013-12-18 ENCOUNTER — Telehealth: Payer: Self-pay | Admitting: Licensed Clinical Social Worker

## 2013-12-18 ENCOUNTER — Ambulatory Visit (INDEPENDENT_AMBULATORY_CARE_PROVIDER_SITE_OTHER): Payer: Medicare Other | Admitting: Cardiothoracic Surgery

## 2013-12-18 ENCOUNTER — Encounter: Payer: Self-pay | Admitting: Cardiothoracic Surgery

## 2013-12-18 ENCOUNTER — Ambulatory Visit
Admission: RE | Admit: 2013-12-18 | Discharge: 2013-12-18 | Disposition: A | Discharge status: Home / Self Care | Payer: Medicare Other | Source: Ambulatory Visit | Attending: Cardiothoracic Surgery | Admitting: Cardiothoracic Surgery

## 2013-12-18 ENCOUNTER — Telehealth: Payer: Self-pay

## 2013-12-18 VITALS — BP 120/87 | HR 69 | Resp 20 | Ht 73.0 in | Wt 170.0 lb

## 2013-12-18 DIAGNOSIS — Z09 Encounter for follow-up examination after completed treatment for conditions other than malignant neoplasm: Secondary | ICD-10-CM

## 2013-12-18 DIAGNOSIS — I5189 Other ill-defined heart diseases: Secondary | ICD-10-CM

## 2013-12-18 DIAGNOSIS — I428 Other cardiomyopathies: Secondary | ICD-10-CM

## 2013-12-18 DIAGNOSIS — I330 Acute and subacute infective endocarditis: Secondary | ICD-10-CM

## 2013-12-18 NOTE — Telephone Encounter (Signed)
RN from [facility] reported critical labs this morning  of potassium greater than 7.5 drawn on Monday 5/18. Requested copy of labs and faxed to patient's pcp Dr. Elease Hashimoto with [facility]. Spoke with his CMA and verbally reported labs.

## 2013-12-18 NOTE — Telephone Encounter (Signed)
Who ordered?  We have not seen him recently.  Is he in hospital?

## 2013-12-18 NOTE — Telephone Encounter (Signed)
HOLD K.  This (BMP) should be repeated today-  Even though this could be hemolyzed, if this is a true value could be very dangerous and even life threatening.

## 2013-12-18 NOTE — Progress Notes (Signed)
[address]       [place], [address]             [id number]                  [facility] Medical Record [id number] Date of Birth: January 25, 1956  Referring MD:Van Dam, Lavell Islam, MD Primary Cardiology: Primary Yetta Flock, MD  Chief Complaint:  Follow Up Visit 10/28/2013  OPERATIVE REPORT  PREOPERATIVE DIAGNOSIS: Aortic root abscess.  POSTOPERATIVE DIAGNOSIS: Aortic root abscess.  SURGICAL PROCEDURE: Redo sternotomy, redo Bentall replacement of aortic  valve, aortic root, and ascending aorta with reimplantation of coronary  arteries using a homograft with debridement of aortic valve abscess,  placement of left femoral arterial line.  SURGEON: Lanelle Bal, MD   History of Present Illness:     Patient returns to the office today in followup after prolonged hospitalization with prosthetic valve endocarditis and aortic root abscess. He underwent redo Bentall replacement of his aortic valve and aortic root and ascending aorta with a homograft because of extensive abscess formation. He's had several admissions since discharge for hypothyroidism and abdominal pain. He comes into the office today and notes he feels better than he has in weeks. He's increasing his physical activity and overall strength appropriately. He developed renal failure after the use of gentamicin and February of this year, after surgery his renal function improved and he is currently not on dialysis.     Zubrod Score: At the time of surgery this patient's most appropriate activity status/level should be described as: []     0    Normal activity, no symptoms [x]     1    Restricted in physical strenuous activity but ambulatory, able to do out light work []     2    Ambulatory and capable of self care, unable to do work activities, up and about                 >50 % of waking hours                                                                                   []      3    Only limited self care, in bed greater than 50% of waking hours []     4    Completely disabled, no self care, confined to bed or chair []     5    Moribund  History  Smoking status  . Never Smoker   Smokeless tobacco  . Never Used       Allergies  Allergen Reactions  . Oxycodone     Gives patient nightmares  . Rifampin Nausea Only    Current Outpatient Prescriptions  Medication Sig Dispense Refill  . amiodarone (PACERONE) 200 MG tablet Take 1 tablet (200 mg total) by mouth daily.  30 tablet  1  . aspirin EC 81 MG tablet Take 81 mg by mouth daily.       Marland Kitchen ceFAZolin (ANCEF) 2-3 GM-% SOLR Patent to get 2 grams every 12 hours until June 8th Report CBC, bmet levels to Dr. Lucianne Lei Dams office  [phone]  10 each  0  . clonazePAM (KLONOPIN) 1 MG tablet TAKE ONE TABLET BY MOUTH TWICE DAILY AS NEEDED FOR ANXIETY  15 tablet  0  . ferrous Q000111Q C-folic acid (TRINSICON / FOLTRIN) capsule Take 1 capsule by mouth daily.  30 capsule  1  . furosemide (LASIX) 40 MG tablet Take 1 tablet (40 mg total) by mouth daily.  30 tablet  2  . levothyroxine (SYNTHROID, LEVOTHROID) 75 MCG tablet Take 3 tablets (225 mcg total) by mouth daily before breakfast.  30 tablet  0  . lisinopril (PRINIVIL,ZESTRIL) 2.5 MG tablet Take 1 tablet (2.5 mg total) by mouth daily.  30 tablet  0  . metoprolol tartrate (LOPRESSOR) 12.5 mg TABS tablet Take 0.5 tablets (12.5 mg total) by mouth 2 (two) times daily.  60 tablet  2  . midodrine (PROAMATINE) 5 MG tablet Take 1 tablet (5 mg total) by mouth 2 (two) times daily with a meal.  60 tablet  1  . mirtazapine (REMERON SOL-TAB) 15 MG disintegrating tablet Take 1 tablet (15 mg total) by mouth at bedtime.  30 tablet  1  . Nutritional Supplements (FEEDING SUPPLEMENT, NEPRO CARB STEADY,) LIQD Take 237 mLs by mouth 2 (two) times daily between meals.  28 Can  1  . nystatin (MYCOSTATIN) 500000 UNITS TABS tablet Take 1 tablet (500,000 Units total) by mouth 3 (three) times  daily.  42 tablet  0  . ondansetron (ZOFRAN-ODT) 4 MG disintegrating tablet Take 1 tablet (4 mg total) by mouth every 8 (eight) hours as needed for nausea or vomiting.  20 tablet  0  . pantoprazole (PROTONIX) 40 MG tablet Take 40 mg by mouth at bedtime.      . potassium chloride SA (KLOR-CON M15) 15 MEQ tablet Take 2 tablets (30 mEq total) by mouth daily.  30 tablet  0  . sertraline (ZOLOFT) 50 MG tablet Take 50 mg by mouth daily.       No current facility-administered medications for this visit.       Physical Exam: BP 120/87  Pulse 69  Resp 20  Ht 6\' 1"  (1.854 m)  Wt 170 lb (77.111 kg)  BMI 22.43 kg/m2  SpO2 97%  General appearance: alert, cooperative, appears stated age and no distress Neurologic: intact Heart: regular rate and rhythm, S1, S2 normal, no murmur, click, rub or gallop Lungs: clear to auscultation bilaterally Abdomen: soft, non-tender; bowel sounds normal; no masses,  no organomegaly Extremities: extremities normal, atraumatic, no cyanosis or edema and Homans sign is negative, no sign of DVT Wound: Sternum is stable and well-healed, central line and left subclavian vein is in place and being used for antibiotics There is a palpable thrill in the patient's left forearm fistula, not much development of the vein. The swelling in the left arm is much improved  Diagnostic Studies & Laboratory data:         Recent Radiology Findings: Dg Chest 2 View  12/18/2013   CLINICAL DATA:  Dizziness.  Nonischemic cardiomyopathy.  EXAM: CHEST  2 VIEW  COMPARISON:  12/06/2013  FINDINGS: PICC tip is in the superior vena cava 4 cm below the carina in good position. Heart size and pulmonary vascularity are normal. The lungs are now clear. No effusions. No acute osseous abnormality. Old left anterior third rib fracture.  IMPRESSION: No active cardiopulmonary disease.   Electronically Signed   By: Rozetta Nunnery M.D.   On: 12/18/2013 15:48      Recent Labs: Lab Results  Component Value  Date   WBC 5.9 12/08/2013   HGB 10.0* 12/08/2013   HCT 32.6* 12/08/2013   PLT 264 12/08/2013   GLUCOSE 88 12/10/2013   CHOL 145 09/25/2013   TRIG 156* 09/25/2013   HDL 27* 09/25/2013   LDLCALC 87 09/25/2013   ALT 5 12/06/2013   AST 16 12/06/2013   NA 146 12/10/2013   K 4.7 12/10/2013   CL 102 12/10/2013   CREATININE 1.31 12/10/2013   BUN 29* 12/10/2013   CO2 30 12/10/2013   TSH 57.740* 12/07/2013   INR 1.51* 10/29/2013   HGBA1C 4.9 10/27/2013      Assessment / Plan:      Patient stable following redo aortic valve and ascending aorta for root abscess. Overall he appears to be improving. His TSH is noted to be 57 he is now on increased dose of Synthroid.  Patient will continue his course of antibiotics through early June. Overall from a surgical standpoint he seems to be making very good progress. I plan to see him back in 2 months.     [name] 12/18/2013 4:45 PM

## 2013-12-18 NOTE — Telephone Encounter (Signed)
[facility] goes to his home every week for labs.

## 2013-12-18 NOTE — Telephone Encounter (Signed)
Left message on patient VM, to hold Potassium and that [facility] nurse is coming out to his house to take his BMP.

## 2013-12-18 NOTE — Telephone Encounter (Signed)
Pt potassium came back greater than 7.5.

## 2013-12-19 ENCOUNTER — Telehealth: Payer: Self-pay

## 2013-12-19 NOTE — Telephone Encounter (Signed)
Received lab results. Patient is informed that Potassium is in normal range.

## 2013-12-24 ENCOUNTER — Encounter: Payer: Self-pay | Admitting: Physician Assistant

## 2013-12-24 ENCOUNTER — Ambulatory Visit (INDEPENDENT_AMBULATORY_CARE_PROVIDER_SITE_OTHER): Payer: Medicare Other | Admitting: Physician Assistant

## 2013-12-24 VITALS — BP 99/70 | HR 64 | Ht 73.0 in | Wt 166.0 lb

## 2013-12-24 DIAGNOSIS — Z954 Presence of other heart-valve replacement: Secondary | ICD-10-CM

## 2013-12-24 DIAGNOSIS — N189 Chronic kidney disease, unspecified: Secondary | ICD-10-CM

## 2013-12-24 DIAGNOSIS — E039 Hypothyroidism, unspecified: Secondary | ICD-10-CM

## 2013-12-24 DIAGNOSIS — I10 Essential (primary) hypertension: Secondary | ICD-10-CM

## 2013-12-24 DIAGNOSIS — I4729 Other ventricular tachycardia: Secondary | ICD-10-CM

## 2013-12-24 DIAGNOSIS — Z952 Presence of prosthetic heart valve: Secondary | ICD-10-CM

## 2013-12-24 DIAGNOSIS — I472 Ventricular tachycardia, unspecified: Secondary | ICD-10-CM

## 2013-12-24 DIAGNOSIS — I5022 Chronic systolic (congestive) heart failure: Secondary | ICD-10-CM

## 2013-12-24 DIAGNOSIS — I428 Other cardiomyopathies: Secondary | ICD-10-CM

## 2013-12-24 NOTE — Progress Notes (Signed)
Cardiology Office Note   Date:  12/24/2013   ID:  Bradley [name], DOB Oct 22, 1955, MRN [id number]  PCP:  [name], MD  Cardiologist:  Dr. Jenkins Rouge      History of Present Illness: Bradley [name] is a 58 y.o. male with a history of aortic stenosis requiring pediatric aortic valvulotomy at age 21 and subsequent Bentall procedure in 02/2000 with placement of the pericardial tissue valve. Cardiac catheterization 02/2010 demonstrated normal coronary arteries. Other history includes LBBB, thyroid cancer, status post thyroidectomy in 2005, surgical hypothyroidism, HTN, depression.  Chest MRA (2/13): Mild to moderate dilatation at the sinus of Valsalva at 4.1 cm, mild dilatation ascending aorta distal to the tube graft at 3.9 cm, moderate dilatation of the innominate artery a 2.1 cm.  He was admitted in 08/2013 with MSSA bacteremia and bacterial endocarditis with evidence of cerebral emboli.  He had worsening renal function (poss from gentamicin) and was started on hemodialysis.  He was then admitted in 09/2013 with poss L sided sternoclavicular osteomyelitis.  He was found to have a perivalvular abscess on TEE and cardiac CTA and ultimately underwent redo Bentall procedure with homograft and debridement of root abscess.  He required amiodarone in the OR for VT/VF (required multiple shocks).  He continued to require hemodialysis.  He had a prolonged hospitalization and was d/c 11/13/13.  He remained on antibiotics and was followed by ID.  He was readmitted in late April 2015 with abdominal pain.  He was seen by cardiology for an elevated troponin.  However, upon review of his records, it was noted he had chronically elevated troponins and no further workup was needed.  Most recently admitted [id number] with a/c systolic CHF.  Echo demonstrated worsening LVF with EF 20-25%.  ACEI and beta blocker were started.  Cardiac CTA prior to his redo Bentall demonstrated normal cors.  Of note, TSH was over 50 and  his synthroid was adjusted.  He was followed by Dr. Harrington Challenger in the hospital.  She suspected his hypothyroidism may have explained his presentation.  Plan is for follow up echo in a few months.  He returns for follow up with his wife.  His weights have been stable.  His chest is still sore.  He notes his breathing is improved. He is NYHA Class 2-2b.  He denies syncope or near syncope.  Denies orthopnea, PND, edema.  No palpitations.  He has been compliant with his Synthroid.  He has not yet seen his PCP for f/u on his thyroid.  He was told his K+ was high on lab work done through home health and decreased his K+ on his own to 1 tab QD.  He also stopped his Lisinopril b/c he thought his BP was too low.  He was not symptomatic with his low BP.  Review of BPs at home demonstrate average systolic BP of A999333.      Studies:  - LHC (02/2010):  Normal cors  - TEE (09/2013):  EF 45-50%  - Echo (12/08/13):  Mild LVH, EF 20-25%, anteroseptal HK, AVR ok, mild MR, mild LAE, mild RVE, mild reduced RVSF, mild RAE, mild to mod TR, PASP 31 mmHg.  - Carotid US (09/2013):  Bilateral ICA 1-39%   Cardiac CTA (10/26/13: IMPRESSION: 1. There is a large paravalvular abscess around the root of the prosthetic aortic valve measuring 58 x 45 x 21 mm located antero-bilaterally (sparing posterior portion of the aortic root) withdynamic obstruction of RCA in systole. 2. Dilated proximal portion  of the aortic arch (post graft) measuring 41 x 40 mm. 3. Normal coronaries. Right dominance.   Recent Labs: 09/25/2013: HDL Cholesterol by NMR 27*; LDL (calc) 87  12/06/2013: ALT 5  12/07/2013: TSH 57.740*  12/08/2013: Hemoglobin 10.0*  12/10/2013: Creatinine 1.31; Potassium 4.7; Pro B Natriuretic peptide (BNP) 13307.0*   Wt Readings from Last 3 Encounters:  12/24/13 166 lb (75.297 kg)  12/18/13 170 lb (77.111 kg)  12/12/13 170 lb (77.111 kg)     Past Medical History  Diagnosis Date  . Anxiety   . Depression   . Hypertension   .  Hypothyroidism, postsurgical   . Aortic stenosis     s/p Bentall with bioprosthetic AVR 02/2010; Last echo (9/11): Moderate LVH, EF 45-50%, AVR functioning appropriately, aortic valve mean gradient 21, diastolic dysfunction. Chest MRA (2/13): Mild to moderate dilatation at the sinus of Valsalva at 4.1 cm, mild dilatation ascending aorta distal to the tube graft at 3.9 cm, moderate dilatation of the innominate artery a 2.1 cm;    . Hx of cardiac cath     a. LHC in 02/2010: normal cors  . Hx of echocardiogram 2014    Echo (10/14): Severe LVH, EF 60-65%, normal wall motion, grade 1 diastolic dysfunction, AVR functioning normally, mild aortic stenosis (mean 19), moderately dilated aorta, mild LAE, mild RVE  . Staphylococcus aureus bacteremia   . Prosthetic valve endocarditis   . Thyroid cancer   . Heart murmur   . ESRD on dialysis     "[place]; TTS" (10/22/2013)    Current Outpatient Prescriptions  Medication Sig Dispense Refill  . amiodarone (PACERONE) 200 MG tablet Take 1 tablet (200 mg total) by mouth daily.  30 tablet  1  . aspirin EC 81 MG tablet Take 81 mg by mouth daily.       Marland Kitchen ceFAZolin (ANCEF) 2-3 GM-% SOLR Patent to get 2 grams every 12 hours until June 8th Report CBC, bmet levels to Dr. Lucianne Lei Dams office [phone]  10 each  0  . clonazePAM (KLONOPIN) 1 MG tablet TAKE ONE TABLET BY MOUTH TWICE DAILY AS NEEDED FOR ANXIETY  15 tablet  0  . ferrous Q000111Q C-folic acid (TRINSICON / FOLTRIN) capsule Take 1 capsule by mouth daily.  30 capsule  1  . furosemide (LASIX) 40 MG tablet Take 1 tablet (40 mg total) by mouth daily.  30 tablet  2  . levothyroxine (SYNTHROID, LEVOTHROID) 75 MCG tablet Take 3 tablets (225 mcg total) by mouth daily before breakfast.  30 tablet  0  . lisinopril (PRINIVIL,ZESTRIL) 2.5 MG tablet Take 1 tablet (2.5 mg total) by mouth daily.  30 tablet  0  . metoprolol tartrate (LOPRESSOR) 12.5 mg TABS tablet Take 0.5 tablets (12.5 mg total) by mouth 2 (two)  times daily.  60 tablet  2  . midodrine (PROAMATINE) 5 MG tablet Take 1 tablet (5 mg total) by mouth 2 (two) times daily with a meal.  60 tablet  1  . mirtazapine (REMERON SOL-TAB) 15 MG disintegrating tablet Take 1 tablet (15 mg total) by mouth at bedtime.  30 tablet  1  . Nutritional Supplements (FEEDING SUPPLEMENT, NEPRO CARB STEADY,) LIQD Take 237 mLs by mouth 2 (two) times daily between meals.  28 Can  1  . nystatin (MYCOSTATIN) 500000 UNITS TABS tablet Take 1 tablet (500,000 Units total) by mouth 3 (three) times daily.  42 tablet  0  . ondansetron (ZOFRAN-ODT) 4 MG disintegrating tablet Take 1 tablet (4 mg total)  by mouth every 8 (eight) hours as needed for nausea or vomiting.  20 tablet  0  . pantoprazole (PROTONIX) 40 MG tablet Take 40 mg by mouth at bedtime.      . potassium chloride SA (KLOR-CON M15) 15 MEQ tablet Take 2 tablets (30 mEq total) by mouth daily.  30 tablet  0  . sertraline (ZOLOFT) 50 MG tablet Take 50 mg by mouth daily.       No current facility-administered medications for this visit.    Allergies:   Oxycodone and Rifampin   Social History:  The patient  reports that he has never smoked. He has never used smokeless tobacco. He reports that he does not drink alcohol or use illicit drugs.   Family History:  The patient's family history includes Heart disease in his father; Kidney Stones in his sister; Thyroid cancer in his sister and sister.   ROS:  Please see the history of present illness.   No bleeding problems.  No fevers.   He has a mole on his L abdomen he wants me to look at.  All other systems reviewed and negative.   PHYSICAL EXAM: VS:  BP 99/70  Pulse 64  Ht 6\' 1"  (1.854 m)  Wt 166 lb (75.297 kg)  BMI 21.91 kg/m2 Well nourished, well developed, in no acute distress HEENT: normal Neck: no JVD Cardiac:  normal S1, S2; RRR; A999333 systolic murmurLUSB Lungs:  clear to auscultation bilaterally, no wheezing, rhonchi or rales Abd: soft, nontender, no  hepatomegaly Ext: no edema Skin: warm and dry1.5 cm dark nevus with regular borders Neuro:  CNs 2-12 intact, no focal abnormalities noted  EKG:  NSR, HR 64, IVCD, NSSTTW changes    ASSESSMENT AND PLAN:  1. Chronic systolic heart failure:  Volume appears stable.  He tells me his K+ was high recently.  Repeat BMET today.  If we cannot get it from his PICC, will request that the [facility] get it this Friday.  2. Non-ischemic cardiomyopathy- EF 20-25% - TTE 11/2013:  I have asked him to remain on Lisinopril and Metoprolol.  Will need to plan on repeat echo in the next 1-2 months.  I do not think that his BP will tolerate further titration of his medications.   3. AVR- Bentall 2011, re-do March 2015 secondary to valve endocarditis:  Recent echo with stable AVR.  Continue follow up with [name] as directed.  F/u with ID as directed.  He completes his antibiotics next month. 4. VT- placed on Amiodarone March 2015:  This occurred during his Bentall procedure in the OR.  I will review with Dr. Jenkins Rouge as to when this can be d/c'd.   5. Hypertension:  BP controlled.  6. HYPOTHYROIDISM:  F/u with PCP. 7. CKD (chronic kidney disease):  He is now off of dialysis.  F/u with nephrology as planned.  Check BMET. 8. Nevus:  I have asked him to see his PCP to have this evaluated further.   9. Disposition: F/u with me or Dr. Jenkins Rouge in 3-4 weeks.    Signed, Versie Starks, MHS 12/24/2013 2:01 PM    [facility] [place], [place], [place]  [address] Phone: [phone]; Fax: [phone]

## 2013-12-24 NOTE — Patient Instructions (Addendum)
**Note De-Identified  Obfuscation** Your physician has recommended you make the following change in your medication: resume Lisinopril at 2.5 mg (1/2 tablet) daily.   Your physician recommends that you return for lab work in: soon with home health nurse, please fax results to Richardson Dopp, PA-C at [phone].  Your physician recommends that you schedule a follow-up appointment in: 3 weeks  Please contact your primary care MD concerning the mole on your stomach.

## 2013-12-25 ENCOUNTER — Telehealth: Payer: Self-pay | Admitting: Physician Assistant

## 2013-12-25 NOTE — Telephone Encounter (Signed)
Hi Ladies,  This pt called with BP readings, see message below. I am in the skills fair right now. If someone would please call pt to see how pt is doing or I can do after I am done with skill fair.   Thank you Venia Minks message  Patient was seen on yesterday  C/o blood pressure , no sob at this time, went walking outside.  1 hour ago 92/66  15 min after 95/62  10 min ago 98/67 heart beat 65.

## 2013-12-25 NOTE — Telephone Encounter (Signed)
New message    Patient was seen on yesterday    C/o blood pressure , no sob at this time, went walking outside.    1 hour ago 92/66 15 min after  95/62  10 min ago  98/67 heart beat  65.

## 2013-12-25 NOTE — Telephone Encounter (Signed)
**Note De-Identified  Obfuscation** The pts wife states that she is giving the pt a whole 2.5 mg tablet of Lisnopril and per yesterdays office visit notes with Richardson Dopp, PA-C she is advised that the pt should only be taking a half a tablet daily. Per Richardson Dopp she is also advised to give him his Lisinopril at bedtime. She verbalized understanding to all instruction given.

## 2013-12-28 ENCOUNTER — Other Ambulatory Visit: Payer: Self-pay | Admitting: Family Medicine

## 2013-12-29 ENCOUNTER — Encounter: Payer: Self-pay | Admitting: Physician Assistant

## 2013-12-30 ENCOUNTER — Encounter: Payer: Self-pay | Admitting: *Deleted

## 2013-12-31 ENCOUNTER — Encounter: Payer: Self-pay | Admitting: Family Medicine

## 2013-12-31 ENCOUNTER — Ambulatory Visit (INDEPENDENT_AMBULATORY_CARE_PROVIDER_SITE_OTHER): Payer: Medicare Other | Admitting: Family Medicine

## 2013-12-31 ENCOUNTER — Telehealth: Payer: Self-pay | Admitting: Family Medicine

## 2013-12-31 ENCOUNTER — Ambulatory Visit: Payer: Self-pay | Admitting: Family Medicine

## 2013-12-31 VITALS — BP 110/66 | HR 54 | Temp 98.4°F | Wt 171.0 lb

## 2013-12-31 DIAGNOSIS — E039 Hypothyroidism, unspecified: Secondary | ICD-10-CM

## 2013-12-31 DIAGNOSIS — I669 Occlusion and stenosis of unspecified cerebral artery: Secondary | ICD-10-CM

## 2013-12-31 DIAGNOSIS — I509 Heart failure, unspecified: Secondary | ICD-10-CM

## 2013-12-31 DIAGNOSIS — I10 Essential (primary) hypertension: Secondary | ICD-10-CM

## 2013-12-31 DIAGNOSIS — I5042 Chronic combined systolic (congestive) and diastolic (congestive) heart failure: Secondary | ICD-10-CM

## 2013-12-31 MED ORDER — LEVOTHYROXINE SODIUM 75 MCG PO TABS: 225.0000 ug | ORAL_TABLET | Freq: Every day | ORAL | Status: DC

## 2013-12-31 NOTE — Telephone Encounter (Signed)
Rx sent to pharmacy   

## 2013-12-31 NOTE — Telephone Encounter (Signed)
[facility] [place], [place] - [address] Is requesting re-fill on levothyroxine (SYNTHROID, LEVOTHROID) 75 MCG tablet

## 2013-12-31 NOTE — Progress Notes (Signed)
Subjective:    Patient ID: Bradley [name], male    DOB: 08-01-1955, 58 y.o.   MRN: [id number]  HPI Patient seen for hospital followup. He was admitted on 5/9 and discharged on 12/10/2013. Has complicated past medical history. He had aortic valve replacement  Few years ago. He presented in February of this year with non-MRSA staph bacteremia with AV endocarditis and septic emboli. He was then admitted in March with sternoclavicular pain and question of osteomyelitis. Hospital course complicated by renal failure  Admitted last month as above with shortness of breath and congestive heart failure. TSH was elevated at 57 on admission. Ejection fraction 20-25%. MI ruled out. Patient was diuresed and low dose ACE inhibitor and beta blocker added per cardiology. Symptomatically stable this time. He is doing daily weights. Compliant with medications.  Currently on Synthroid 225 mcg daily and compliant. No peripheral edema issues. No orthopnea. No recent fevers or chills. No chest pain. He has an increase fatigue and malaise for several months now.  History of acute renal failure. He was briefly on dialysis. His renal function has been steadily improving and he is now off dialysis. He finishes antibiotics for prosthetic valve endocarditis June 8.  Has history of depression which is treated with sertraline and currently stable.  Past Medical History  Diagnosis Date  . Anxiety   . Depression   . Hypertension   . Hypothyroidism, postsurgical   . Aortic stenosis     s/p Bentall with bioprosthetic AVR 02/2010; Last echo (9/11): Moderate LVH, EF 45-50%, AVR functioning appropriately, aortic valve mean gradient 21, diastolic dysfunction. Chest MRA (2/13): Mild to moderate dilatation at the sinus of Valsalva at 4.1 cm, mild dilatation ascending aorta distal to the tube graft at 3.9 cm, moderate dilatation of the innominate artery a 2.1 cm;    . Hx of cardiac cath     a. LHC in 02/2010: normal cors  . Hx of  echocardiogram 2014    Echo (10/14): Severe LVH, EF 60-65%, normal wall motion, grade 1 diastolic dysfunction, AVR functioning normally, mild aortic stenosis (mean 19), moderately dilated aorta, mild LAE, mild RVE  . Staphylococcus aureus bacteremia   . Prosthetic valve endocarditis   . Thyroid cancer   . Heart murmur   . ESRD on dialysis     "[place]; TTS" (10/22/2013)   Past Surgical History  Procedure Laterality Date  . Cardiac catheterization  03/02/2010    NORMAL CORONARY ARTERY  . Sternotomy      REDO  . Transthoracic echocardiogram  03/2010    SHOWED MILD REDUCTION OF LV FUNCTION  . Thyroidectomy  ~ 2005  . Shoulder arthroscopy w/ rotator cuff repair Right 2012  . Av fistula placement Left 10/08/2013    Procedure: ARTERIOVENOUS (AV) FISTULA CREATION- LEFT ARM; Radial Cephalic ;  Surgeon: Mal [name], MD;  Location: [place];  Service: Vascular;  Laterality: Left;  . Aortic valve replacement  2011  . Cardiac valve replacement    . Aortic valve repair  1968  . Tee without cardioversion N/A 10/24/2013    Procedure: TRANSESOPHAGEAL ECHOCARDIOGRAM (TEE);  Surgeon: Dorothy Spark, MD;  Location: [facility] ENDOSCOPY;  Service: Cardiovascular;  Laterality: N/A;  Deneen Harts procedure N/A 10/28/2013    Procedure: REDO BENTALL PROCEDURE, debridment of aoritc root abscess, replacement of aortic root, ascending aorta and aortic valve with homograft. Insertion of left femoral arterial line;  Surgeon: [name], MD;  Location: [place];  Service: Open Heart  Surgery;  Laterality: N/A;  . Intraoperative transesophageal echocardiogram N/A 10/28/2013    Procedure: INTRAOPERATIVE TRANSESOPHAGEAL ECHOCARDIOGRAM;  Surgeon: [name], MD;  Location: [place];  Service: Open Heart Surgery;  Laterality: N/A;    reports that he has never smoked. He has never used smokeless tobacco. He reports that he does not drink alcohol or use illicit drugs. family history includes Heart disease in his father;  Kidney Stones in his sister; Thyroid cancer in his sister and sister. Allergies  Allergen Reactions  . Oxycodone     Gives patient nightmares  . Rifampin Nausea Only        Review of Systems  Constitutional: Positive for fatigue. Negative for fever and chills.  Cardiovascular: Negative for chest pain, palpitations and leg swelling.  Gastrointestinal: Negative for abdominal pain.  Genitourinary: Negative for dysuria.  Neurological: Negative for dizziness and syncope.  Hematological: Negative for adenopathy.  Psychiatric/Behavioral: Negative for confusion.       Objective:   Physical Exam  Constitutional: He appears well-developed and well-nourished.  Neck: Neck supple. No thyromegaly present.  Cardiovascular: Normal rate and regular rhythm.   Pulmonary/Chest: Effort normal and breath sounds normal. No respiratory distress. He has no wheezes. He has no rales.  Musculoskeletal: He exhibits no edema.  Psychiatric: He has a normal mood and affect. His behavior is normal.          Assessment & Plan:  #1 chronic combined systolic and diastolic heart failure with recent decompensation. Clinically stable at this time. Continue low-dose lisinopril with metoprolol and furosemide. Continue daily weights. Recent basic metabolic panel per home health on Monday #2 hypothyroidism. Recent elevated TSH with increase in dosage. Repeat TSH in one month. If not improving at that point consider absorption issues. #3 history of prosthetic valve endocarditis finishing Ancef with 5 more days of therapy. Continue close followup with infectious disease #4 acute on chronic kidney disease with recent dialysis. He's had gradual improvement in creatinine #5 hypertension which is currently stable #6 history of recurrent depression which is currently stable

## 2013-12-31 NOTE — Progress Notes (Signed)
Pre visit review using our clinic review tool, if applicable. No additional management support is needed unless otherwise documented below in the visit note. 

## 2014-01-01 ENCOUNTER — Telehealth: Payer: Self-pay | Admitting: *Deleted

## 2014-01-01 ENCOUNTER — Encounter: Payer: Self-pay | Admitting: Family Medicine

## 2014-01-01 NOTE — Telephone Encounter (Signed)
Pt notified about lab results. Hgb, Creatinine stable. Pt aware K+ too high 5.8 and to d/c K+. Bmet w/HHRN in 1 week.

## 2014-01-02 ENCOUNTER — Encounter: Payer: Self-pay | Admitting: Family Medicine

## 2014-01-05 ENCOUNTER — Encounter: Payer: Self-pay | Admitting: Physician Assistant

## 2014-01-06 ENCOUNTER — Emergency Department (HOSPITAL_COMMUNITY): Payer: Medicare Other

## 2014-01-06 ENCOUNTER — Other Ambulatory Visit: Payer: Self-pay | Admitting: Infectious Disease

## 2014-01-06 ENCOUNTER — Telehealth: Payer: Self-pay | Admitting: *Deleted

## 2014-01-06 ENCOUNTER — Encounter (HOSPITAL_COMMUNITY): Payer: Self-pay | Admitting: Emergency Medicine

## 2014-01-06 ENCOUNTER — Encounter: Payer: Self-pay | Admitting: Physician Assistant

## 2014-01-06 ENCOUNTER — Emergency Department (HOSPITAL_COMMUNITY)
Admission: EM | Admit: 2014-01-06 | Discharge: 2014-01-06 | Disposition: A | Discharge status: Home / Self Care | Payer: Medicare Other | Attending: Emergency Medicine | Admitting: Emergency Medicine

## 2014-01-06 ENCOUNTER — Other Ambulatory Visit: Payer: Self-pay | Admitting: *Deleted

## 2014-01-06 DIAGNOSIS — Z7982 Long term (current) use of aspirin: Secondary | ICD-10-CM | POA: Insufficient documentation

## 2014-01-06 DIAGNOSIS — Z85850 Personal history of malignant neoplasm of thyroid: Secondary | ICD-10-CM | POA: Insufficient documentation

## 2014-01-06 DIAGNOSIS — N186 End stage renal disease: Secondary | ICD-10-CM | POA: Insufficient documentation

## 2014-01-06 DIAGNOSIS — F411 Generalized anxiety disorder: Secondary | ICD-10-CM | POA: Insufficient documentation

## 2014-01-06 DIAGNOSIS — E890 Postprocedural hypothyroidism: Secondary | ICD-10-CM | POA: Insufficient documentation

## 2014-01-06 DIAGNOSIS — I120 Hypertensive chronic kidney disease with stage 5 chronic kidney disease or end stage renal disease: Secondary | ICD-10-CM | POA: Insufficient documentation

## 2014-01-06 DIAGNOSIS — F329 Major depressive disorder, single episode, unspecified: Secondary | ICD-10-CM | POA: Insufficient documentation

## 2014-01-06 DIAGNOSIS — Z8619 Personal history of other infectious and parasitic diseases: Secondary | ICD-10-CM | POA: Insufficient documentation

## 2014-01-06 DIAGNOSIS — Z9889 Other specified postprocedural states: Secondary | ICD-10-CM | POA: Insufficient documentation

## 2014-01-06 DIAGNOSIS — R011 Cardiac murmur, unspecified: Secondary | ICD-10-CM | POA: Insufficient documentation

## 2014-01-06 DIAGNOSIS — E875 Hyperkalemia: Secondary | ICD-10-CM | POA: Insufficient documentation

## 2014-01-06 DIAGNOSIS — F3289 Other specified depressive episodes: Secondary | ICD-10-CM | POA: Insufficient documentation

## 2014-01-06 DIAGNOSIS — Z992 Dependence on renal dialysis: Secondary | ICD-10-CM | POA: Insufficient documentation

## 2014-01-06 DIAGNOSIS — Z79899 Other long term (current) drug therapy: Secondary | ICD-10-CM | POA: Insufficient documentation

## 2014-01-06 LAB — COMPREHENSIVE METABOLIC PANEL
ALT: <5 U/L (ref 0–53)
AST: 15 U/L (ref 0–37)
Albumin: 3.4 g/dL — ABNORMAL LOW (ref 3.5–5.2)
Alkaline Phosphatase: 60 U/L (ref 39–117)
BUN: 32 mg/dL — ABNORMAL HIGH (ref 6–23)
CO2: 27 mEq/L (ref 19–32)
Calcium: 9.6 mg/dL (ref 8.4–10.5)
Chloride: 105 mEq/L (ref 96–112)
Creatinine, Ser: 1.16 mg/dL (ref 0.50–1.35)
GFR calc Af Amer: 80 mL/min — ABNORMAL LOW (ref >90)
GFR calc non Af Amer: 69 mL/min — ABNORMAL LOW (ref >90)
Glucose, Bld: 86 mg/dL (ref 70–99)
Potassium: 4.2 mEq/L (ref 3.7–5.3)
Sodium: 145 mEq/L (ref 137–147)
Total Bilirubin: <0.2 mg/dL — ABNORMAL LOW (ref 0.3–1.2)
Total Protein: 7.2 g/dL (ref 6.0–8.3)

## 2014-01-06 LAB — CBC WITH DIFFERENTIAL/PLATELET
Basophils Absolute: 0 10*3/uL (ref 0.0–0.1)
Basophils Relative: 0 % (ref 0–1)
Eosinophils Absolute: 0.1 10*3/uL (ref 0.0–0.7)
Eosinophils Relative: 2 % (ref 0–5)
HCT: 32.3 % — ABNORMAL LOW (ref 39.0–52.0)
Hemoglobin: 10.2 g/dL — ABNORMAL LOW (ref 13.0–17.0)
Lymphocytes Relative: 16 % (ref 12–46)
Lymphs Abs: 0.8 10*3/uL (ref 0.7–4.0)
MCH: 30.4 pg (ref 26.0–34.0)
MCHC: 31.6 g/dL (ref 30.0–36.0)
MCV: 96.1 fL (ref 78.0–100.0)
Monocytes Absolute: 0.8 10*3/uL (ref 0.1–1.0)
Monocytes Relative: 16 % — ABNORMAL HIGH (ref 3–12)
Neutro Abs: 3.4 10*3/uL (ref 1.7–7.7)
Neutrophils Relative %: 66 % (ref 43–77)
Platelets: 156 10*3/uL (ref 150–400)
RBC: 3.36 MIL/uL — ABNORMAL LOW (ref 4.22–5.81)
RDW: 14.7 % (ref 11.5–15.5)
WBC: 5.2 10*3/uL (ref 4.0–10.5)

## 2014-01-06 LAB — PRO B NATRIURETIC PEPTIDE: Pro B Natriuretic peptide (BNP): 9238 pg/mL — ABNORMAL HIGH (ref 0–125)

## 2014-01-06 NOTE — Telephone Encounter (Signed)
[facility] called to make sure we received lab work for the patient as he has a critical lab value of 7.5 on his potassium from 01/05/14 draw. Advised labs are in-house and RN Langley Gauss is handling as she got the labs and is calling the patient PCP

## 2014-01-06 NOTE — ED Provider Notes (Signed)
CSN: [id number]     Arrival date & time 01/06/14  1756 History   First MD Initiated Contact with Patient 01/06/14 2008     Chief Complaint  Patient presents with  . Hyperkalemia      (Consider location/radiation/quality/duration/timing/severity/associated sxs/prior Treatment) HPI Pt is a 58yo male with hx of aortic stenosis and prosthetic valve endocarditis for which he was receiving home IV antibiotics, just finished last dose of 6 week tx yesterday.  Pt was called by his MD to come to ED for further evaluation and tx of hyperkalemia as his home health nurse drew blood yesterday, showed his potassium to be "in the 7s"  Pt had his blood draw 3 weeks ago, potassium was 5.1 at that time, pt was told to discontinue home potassium supplements, however due to increased potassium from last night as well, there was concern for potassium being at a critically high level. Pt denies fever, n/v/d. Denies chest pain, palpitations, SOB, muscle pain. Pt's wife does report he has JVD which is concerning because last time he had this, he had CHF.  Pt states today, his 5lb weight gain from yesterday had gone back down to his normal weight.    Past Medical History  Diagnosis Date  . Anxiety   . Depression   . Hypertension   . Hypothyroidism, postsurgical   . Aortic stenosis     s/p Bentall with bioprosthetic AVR 02/2010; Last echo (9/11): Moderate LVH, EF 45-50%, AVR functioning appropriately, aortic valve mean gradient 21, diastolic dysfunction. Chest MRA (2/13): Mild to moderate dilatation at the sinus of Valsalva at 4.1 cm, mild dilatation ascending aorta distal to the tube graft at 3.9 cm, moderate dilatation of the innominate artery a 2.1 cm;    . Hx of cardiac cath     a. LHC in 02/2010: normal cors  . Hx of echocardiogram 2014    Echo (10/14): Severe LVH, EF 60-65%, normal wall motion, grade 1 diastolic dysfunction, AVR functioning normally, mild aortic stenosis (mean 19), moderately dilated aorta, mild  LAE, mild RVE  . Staphylococcus aureus bacteremia   . Prosthetic valve endocarditis   . Thyroid cancer   . Heart murmur   . ESRD on dialysis     "[place]; TTS" (10/22/2013)   Past Surgical History  Procedure Laterality Date  . Cardiac catheterization  03/02/2010    NORMAL CORONARY ARTERY  . Sternotomy      REDO  . Transthoracic echocardiogram  03/2010    SHOWED MILD REDUCTION OF LV FUNCTION  . Thyroidectomy  ~ 2005  . Shoulder arthroscopy w/ rotator cuff repair Right 2012  . Av fistula placement Left 10/08/2013    Procedure: ARTERIOVENOUS (AV) FISTULA CREATION- LEFT ARM; Radial Cephalic ;  Surgeon: Mal [name], MD;  Location: [place];  Service: Vascular;  Laterality: Left;  . Aortic valve replacement  2011  . Aortic valve repair  1968  . Tee without cardioversion N/A 10/24/2013    Procedure: TRANSESOPHAGEAL ECHOCARDIOGRAM (TEE);  Surgeon: Dorothy Spark, MD;  Location: [facility] ENDOSCOPY;  Service: Cardiovascular;  Laterality: N/A;  Deneen Harts procedure N/A 10/28/2013    Procedure: REDO BENTALL PROCEDURE, debridment of aoritc root abscess, replacement of aortic root, ascending aorta and aortic valve with homograft. Insertion of left femoral arterial line;  Surgeon: [name], MD;  Location: [place];  Service: Open Heart Surgery;  Laterality: N/A;  . Intraoperative transesophageal echocardiogram N/A 10/28/2013    Procedure: INTRAOPERATIVE TRANSESOPHAGEAL ECHOCARDIOGRAM;  Surgeon: Lilia Argue  Servando Snare, MD;  Location: [place];  Service: Open Heart Surgery;  Laterality: N/A;  . Cardiac valve replacement     Family History  Problem Relation Age of Onset  . Heart disease Father   . Thyroid cancer Sister   . Thyroid cancer Sister   . Kidney Stones Sister    History  Substance Use Topics  . Smoking status: Never Smoker   . Smokeless tobacco: Never Used  . Alcohol Use: No     Comment: Occasional    Review of Systems  Constitutional: Negative for fever and chills.  Respiratory:  Negative for cough and shortness of breath.   Cardiovascular: Negative for chest pain, palpitations and leg swelling.  Gastrointestinal: Negative for nausea, vomiting, abdominal pain, diarrhea, constipation and blood in stool.  Musculoskeletal: Negative for back pain.  All other systems reviewed and are negative.    Allergies  Oxycodone and Rifampin  Home Medications   Prior to Admission medications   Medication Sig Start Date End Date Taking? Authorizing Provider  amiodarone (PACERONE) 200 MG tablet Take 1 tablet (200 mg total) by mouth daily. 11/13/13  Yes Coolidge Breeze, PA-C  aspirin EC 81 MG tablet Take 81 mg by mouth daily.    Yes Historical Provider, MD  clonazePAM (KLONOPIN) 1 MG tablet TAKE ONE TABLET BY MOUTH TWICE DAILY AS NEEDED FOR ANXIETY   Yes [name], MD  ferrous [id number] C-folic acid (TRINSICON / FOLTRIN) capsule Take 1 capsule by mouth daily. 11/13/13  Yes Gina L Collins, PA-C  furosemide (LASIX) 40 MG tablet Take 1 tablet (40 mg total) by mouth daily. 12/11/13  Yes Kinnie Feil, MD  levothyroxine (SYNTHROID, LEVOTHROID) 75 MCG tablet Take 3 tablets (225 mcg total) by mouth daily before breakfast. 12/31/13  Yes [name], MD  lisinopril (PRINIVIL,ZESTRIL) 2.5 MG tablet Take 1 tablet (2.5 mg total) by mouth daily. 12/11/13  Yes Kinnie Feil, MD  metoprolol tartrate (LOPRESSOR) 12.5 mg TABS tablet Take 0.5 tablets (12.5 mg total) by mouth 2 (two) times daily. 12/11/13  Yes Kinnie Feil, MD  midodrine (PROAMATINE) 5 MG tablet Take 1 tablet (5 mg total) by mouth 2 (two) times daily with a meal. 11/13/13  Yes Coolidge Breeze, PA-C  mirtazapine (REMERON SOL-TAB) 15 MG disintegrating tablet Take 1 tablet (15 mg total) by mouth at bedtime. 11/13/13  Yes Gina L Collins, PA-C  ondansetron (ZOFRAN-ODT) 4 MG disintegrating tablet Take 1 tablet (4 mg total) by mouth every 8 (eight) hours as needed for nausea or vomiting. 11/17/13  Yes [name], MD   sertraline (ZOLOFT) 50 MG tablet TAKE ONE TABLET BY MOUTH ONCE DAILY   Yes [name], MD   BP 114/76  Pulse 70  Temp(Src) 98 F (36.7 C) (Oral)  Resp 23  Wt 170 lb 1.6 oz (77.157 kg)  SpO2 100% Physical Exam  Nursing note and vitals reviewed. Constitutional: He appears well-developed and well-nourished.  Pt lying in exam bed, NAD.  HENT:  Head: Normocephalic and atraumatic.  Eyes: Conjunctivae are normal. No scleral icterus.  Neck: Normal range of motion. JVD present.  Cardiovascular: Normal rate, regular rhythm and normal heart sounds.   Pulmonary/Chest: Effort normal and breath sounds normal. No respiratory distress. He has no wheezes. He has no rales. He exhibits no tenderness.  PIC line in place in left anterior chest. Appears clean and dry. Lungs: CTAB  Abdominal: Soft. Bowel sounds are normal. He exhibits no distension and no mass.  There is no tenderness. There is no rebound and no guarding.  Musculoskeletal: Normal range of motion.  Neurological: He is alert.  Skin: Skin is warm and dry.     ED Course  Procedures (including critical care time) Labs Review Labs Reviewed  CBC WITH DIFFERENTIAL - Abnormal; Notable for the following:    RBC 3.36 (*)    Hemoglobin 10.2 (*)    HCT 32.3 (*)    Monocytes Relative 16 (*)    All other components within normal limits  COMPREHENSIVE METABOLIC PANEL - Abnormal; Notable for the following:    BUN 32 (*)    Albumin 3.4 (*)    Total Bilirubin <0.2 (*)    GFR calc non Af Amer 69 (*)    GFR calc Af Amer 80 (*)    All other components within normal limits  PRO B NATRIURETIC PEPTIDE - Abnormal; Notable for the following:    Pro B Natriuretic peptide (BNP) 9238.0 (*)    All other components within normal limits    Imaging Review Dg Chest 2 View  01/06/2014   CLINICAL DATA:  Shortness of breath  EXAM: CHEST  2 VIEW  COMPARISON:  12/18/2013  FINDINGS: The left-sided chest PICC is noted in satisfactory position. Postsurgical  changes are again seen. The cardiac shadow is stable. Diffuse interstitial changes are noted similar to that seen on the prior exam. No acute infiltrate or sizable effusion is seen. No acute bony abnormality is noted.  IMPRESSION: No active cardiopulmonary disease.   Electronically Signed   By: Inez Catalina M.D.   On: 01/06/2014 21:47     EKG Interpretation None      MDM   Final diagnoses:  Abnormal labor    Pt is a 58yo male sent to ED for further evaluation and tx of hyperkalemia as home health nurse took blood yesterday that showed his potassium was "in the 7s"   Pt is asymptomatic besides JVD pt's wife pointed out.  Wife is concerned he may be getting CHF as last time pt had JVD too.  Pt denies chest pain, SOB, possible 5lb weight gain yesterday but states he is back to his normal weight today. Lungs: CTAB.  Labs: CBC-unremarkable, CMP-unremarkable, K: 4.2-WNL  Discussed pt with Dr. Mingo Amber who also examined pt. Will get CXR and BNP, if unremarkable, will discharge pt home to f/u with PCP.  CXR: unremarkable, no active cardiopulmonary disease.   BNP: 9238, has improved from 13307 from 3 weeks ago.   Pt has continued to be asymptomatic in ED. Not concerned for CHF exacerbation.  Discussed results with Dr. Mingo Amber and pt, pt may be discharged home to f/u with PCP.  Return precautions provided. Pt verbalized understanding and agreement with tx plan.    Noland Fordyce, PA-C 01/06/14 2244

## 2014-01-06 NOTE — Telephone Encounter (Signed)
s/w Apolonio Schneiders, RN at Dr. Erick Blinks office in reference to 7.5 K+ on pt. K+ was d/c'd 12/31/13. Per Brynda Rim. PA pt needs STAT BMET today. Dr. Erick Blinks office will call pt and advise. I will let PA also know Dr. Erick Blinks office handling.

## 2014-01-06 NOTE — Telephone Encounter (Signed)
Elevated potassium level = 7.5.  Last week potassium value = 5.8.  Per scanned lab report from last week, Dr Elease Hashimoto d/ced the pt's PO potassium supplement.  Phone call to Dr. Erick Blinks office.  Requested that [facility] fax them the report to their office.  Faxed 01/05/14 lab report to [phone].  Received by Dr. Erick Blinks office.  Fax confirmation received.

## 2014-01-06 NOTE — Telephone Encounter (Signed)
Dr Arlyss Queen office called and said that pt's potassium was 7.5.  Dr Elease Hashimoto has taken him off the potassium when his reading was 5.8.  Dr Elease Hashimoto is out of the office so I spoke with [facility] and she said to call in [place] per 60 mL.  Pt is to take it tonight and recheck bmet in the morning STAT.  Pt has a PICC line so I called Manuela Schwartz, RN who is his home health nurse and let her know what was going on.  I gave her verbal orders for bmet STAT per Roxy Cedar, NP.  Called pt and explained to pt what he needed to do.  He agreed and said I needed to send rx to [facility].  rx was called in.  Pt called back and stated that Dr Drucilla Schmidt instructed him to the ER.  Pt has decided to go to the Emergency Room.  Note forwarded to Dr Elease Hashimoto for review.  I left lab report on your desk

## 2014-01-06 NOTE — Telephone Encounter (Signed)
Per Dr Sherren Mocha patient should go to the ER.  Spoke with patient and wife.  Verbal agreement.

## 2014-01-06 NOTE — Telephone Encounter (Signed)
per Richardson Dopp, PA pt is to hold lisinopril until STAT BMET has been done.

## 2014-01-06 NOTE — ED Notes (Addendum)
Pt in stating that he was called from his MD and stated his potassium level was elevated- levels were at 5.8 on 6/1 so patient was told to stop taking potassium supplements, went in yesterday for a re-check and was told it was higher and to come to ED, pt denies any symptoms or complaints- pt was recently taken off dialysis about a month ago- pt started that after an antibiotic use put him into kidney failure- pt has continued a different antibiotic at home and had his last dose this am, pt with PICC line in place upon admission. Pt had open heart surgery in march to replace a valve after an infection started on the area. Pt denies recent fevers.

## 2014-01-06 NOTE — Telephone Encounter (Signed)
Pt called [facility].  Last dose of Ancef was 01/05/14.  When will PIC be pulled?  RN spoke with Dr. Tommy Medal.  Order was called to [facility] to pull the PICC.  RN notified pt that the [facility] RN, Manuela Schwartz, should be calling him to arrange a home visit to remove the PICC.  Pt verbalized understanding.

## 2014-01-07 NOTE — ED Provider Notes (Signed)
Medical screening examination/treatment/procedure(s) were conducted as a shared visit with non-physician practitioner(s) and myself.  I personally evaluated the patient during the encounter.   EKG Interpretation None      Patient here with concerns for hyperkalemia and CHF. Hyperkalemia - has home health - drew labs, K elevated. Not elevated here. Likely hemolyzed. CHF: had recent mitral valve infection, is on IV antibiotics. Had CHF at presentation. Wife concerned b/c of mild JVD. When sitting up, minimal to no JVD present. Lungs clear, no rales. BNP improving, no CXR findings c/w pulmonary edema. Stable for discharge.  Osvaldo Shipper, MD 01/07/14 [phone]

## 2014-01-07 NOTE — Telephone Encounter (Signed)
I had him go to ED and BMP was normal  We need to get his central line out Need IR to get out I believe

## 2014-01-08 ENCOUNTER — Telehealth: Payer: Self-pay | Admitting: Licensed Clinical Social Worker

## 2014-01-08 ENCOUNTER — Telehealth: Payer: Self-pay | Admitting: Infectious Disease

## 2014-01-08 DIAGNOSIS — B9561 Methicillin susceptible Staphylococcus aureus infection as the cause of diseases classified elsewhere: Secondary | ICD-10-CM

## 2014-01-08 DIAGNOSIS — R7881 Bacteremia: Secondary | ICD-10-CM

## 2014-01-08 NOTE — Telephone Encounter (Signed)
Per Dr. Tommy Medal patient has tunneled picc line that needs to be removed by tomorrow, call placed to [facility] in Interventional radiology left message with patient's information. Waiting on a appointment. Left message for her to call me back or call patient directly with an appointment.

## 2014-01-08 NOTE — Telephone Encounter (Signed)
PT NEEDS CENTRAL LINE REMOVED  Thanks Tamika!!

## 2014-01-09 ENCOUNTER — Ambulatory Visit (HOSPITAL_COMMUNITY): Payer: Medicare Other

## 2014-01-12 ENCOUNTER — Encounter: Payer: Self-pay | Admitting: Physician Assistant

## 2014-01-12 NOTE — Telephone Encounter (Signed)
Thank you Travis! 

## 2014-01-12 NOTE — Telephone Encounter (Signed)
Patient wife called advised she has not heard back about appt to have the patient PICC removed. Advised her is is set for 01/14/14 at 11 am and gave her directions.

## 2014-01-14 ENCOUNTER — Other Ambulatory Visit: Payer: Self-pay | Admitting: Infectious Disease

## 2014-01-14 ENCOUNTER — Ambulatory Visit (HOSPITAL_COMMUNITY)
Admission: RE | Admit: 2014-01-14 | Discharge: 2014-01-14 | Disposition: A | Discharge status: Home / Self Care | Payer: Medicare Other | Source: Ambulatory Visit | Attending: Infectious Disease | Admitting: Infectious Disease

## 2014-01-14 DIAGNOSIS — B9561 Methicillin susceptible Staphylococcus aureus infection as the cause of diseases classified elsewhere: Secondary | ICD-10-CM

## 2014-01-14 DIAGNOSIS — R7881 Bacteremia: Secondary | ICD-10-CM

## 2014-01-14 DIAGNOSIS — Z452 Encounter for adjustment and management of vascular access device: Secondary | ICD-10-CM | POA: Insufficient documentation

## 2014-01-14 NOTE — Procedures (Signed)
Removal of left chest tunneled central venous catheter.  No immediate complication.

## 2014-01-15 ENCOUNTER — Ambulatory Visit (INDEPENDENT_AMBULATORY_CARE_PROVIDER_SITE_OTHER): Payer: Medicare Other | Admitting: Physician Assistant

## 2014-01-15 ENCOUNTER — Encounter: Payer: Self-pay | Admitting: Physician Assistant

## 2014-01-15 VITALS — BP 80/50 | HR 55 | Ht 73.0 in | Wt 171.0 lb

## 2014-01-15 DIAGNOSIS — N189 Chronic kidney disease, unspecified: Secondary | ICD-10-CM

## 2014-01-15 DIAGNOSIS — E039 Hypothyroidism, unspecified: Secondary | ICD-10-CM

## 2014-01-15 DIAGNOSIS — I5022 Chronic systolic (congestive) heart failure: Secondary | ICD-10-CM

## 2014-01-15 DIAGNOSIS — I669 Occlusion and stenosis of unspecified cerebral artery: Secondary | ICD-10-CM

## 2014-01-15 DIAGNOSIS — I10 Essential (primary) hypertension: Secondary | ICD-10-CM

## 2014-01-15 DIAGNOSIS — I428 Other cardiomyopathies: Secondary | ICD-10-CM

## 2014-01-15 DIAGNOSIS — Z954 Presence of other heart-valve replacement: Secondary | ICD-10-CM

## 2014-01-15 DIAGNOSIS — I472 Ventricular tachycardia, unspecified: Secondary | ICD-10-CM

## 2014-01-15 DIAGNOSIS — Z952 Presence of prosthetic heart valve: Secondary | ICD-10-CM

## 2014-01-15 DIAGNOSIS — I4729 Other ventricular tachycardia: Secondary | ICD-10-CM

## 2014-01-15 MED ORDER — FUROSEMIDE 20 MG PO TABS: 20.0000 mg | ORAL_TABLET | Freq: Every day | ORAL | Status: DC

## 2014-01-15 NOTE — Patient Instructions (Addendum)
HOLD LISINOPRIL  MONITOR BLOOD PRESSURE BEFORE BED; IF: SYSTOLIC BP IS ABOVE [id number] THEN TAKE LISINOPRIL IF SYSTOLIC BP IS BELOW [id number] THEN DO NOT TAKE THE LISINOPRIL  YOU WILL NEED AN ECHO 1 WEEK BEFORE YOUR APPT WITH DR. ROSS  Your physician recommends that you schedule a follow-up appointment in: 2 MONTHS WITH DR. ROSS   MAKE SURE TO WEIGH DAILY; IF WT IS UP 3 LB'S X 1 DAY OR 5 LB'S X 1 WEEK THEN TAKE EXTRA LASIX 20 MG AND CALL THE OFFICE [phone] TO LET SCOTT WEAVER, PAC KNOW    STOP AMIODARONE

## 2014-01-15 NOTE — Progress Notes (Signed)
Cardiology Office Note   Date:  01/15/2014   ID:  Bradley [name], DOB 1955/08/01, MRN [id number]  PCP:  [name], MD  Cardiologist:  Dr. Dorris Carnes      History of Present Illness: Bradley [name] is a 58 y.o. male with a history of aortic stenosis requiring aortic valvulotomy at age 72 and subsequent Bentall procedure in 02/2000 with placement of a pericardial tissue valve. Cardiac catheterization 02/2010 demonstrated normal coronary arteries. Other history includes LBBB, thyroid cancer, status post thyroidectomy in 2005, surgical hypothyroidism, HTN, depression, chronically elevated troponins.      He was admitted in 08/2013 with MSSA bacteremia and bacterial endocarditis with evidence of cerebral emboli.  He had worsening renal function (possibly from gentamicin) and was started on hemodialysis.  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  He was found to have a perivalvular abscess on TEE and cardiac CTA and ultimately underwent redo Bentall procedure with homograft and debridement of root abscess.  He required amiodarone in the OR for VT/VF (required multiple shocks).  He continued to require hemodialysis.  He had a prolonged hospitalization and was d/c 11/13/13.   Admitted [id number] with a/c systolic CHF.  Echo demonstrated worsening LVF with EF 20-25%.  ACEI and beta blocker were started.  Cardiac CTA prior to his redo Bentall demonstrated normal cors.  Of note, TSH was over 50 and his synthroid was adjusted.    Last seen here 12/24/13.  I have since reviewed his case with [name] and [name] who both felt that he no longer needed Amiodarone.  BP has been low and has limited titration of his CHF medications.  He returns for follow up.  Since last seen, he has been doing well. Blood pressure does continue to run low. He does feel lightheaded and nauseated with this. Otherwise, denies chest pain, significant shortness of breath, orthopnea,  PND or significant edema.  Studies:  - LHC (02/2010):  Normal cors  - TEE (09/2013):  EF 45-50%  - Echo (12/08/13):  Mild LVH, EF 20-25%, anteroseptal HK, AVR ok, mild MR, mild LAE, mild RVE, mild reduced RVSF, mild RAE, mild to mod TR, PASP 31 mmHg.  - Carotid US (09/2013):  Bilateral ICA 1-39%   Cardiac CTA (10/26/13: IMPRESSION: 1. There is a large paravalvular abscess around the root of the prosthetic aortic valve measuring 58 x 45 x 21 mm located antero-bilaterally (sparing posterior portion of the aortic root) withdynamic obstruction of RCA in systole. 2. Dilated proximal portion of the aortic arch (post graft) measuring 41 x 40 mm. 3. Normal coronaries. Right dominance.   Recent Labs: 09/25/2013: HDL Cholesterol by NMR 27*; LDL (calc) 87  12/07/2013: TSH 57.740*  01/06/2014: ALT <5; Creatinine 1.16; Hemoglobin 10.2*; Potassium 4.2; Pro B Natriuretic peptide (BNP) 9238.0*   Wt Readings from Last 3 Encounters:  01/15/14 171 lb (77.565 kg)  01/06/14 170 lb 1.6 oz (77.157 kg)  12/31/13 171 lb (77.565 kg)     Past Medical History  Diagnosis Date  . Anxiety   . Depression   . Hypertension   . Hypothyroidism, postsurgical   . Aortic stenosis     s/p Bentall with bioprosthetic AVR 02/2010; Last echo (9/11): Moderate LVH, EF 45-50%, AVR functioning appropriately, aortic valve mean gradient 21, diastolic dysfunction. Chest MRA (2/13): Mild to moderate dilatation at the sinus of Valsalva at 4.1 cm, mild dilatation ascending aorta distal to the tube graft at 3.9 cm, moderate  dilatation of the innominate artery a 2.1 cm;    . Hx of cardiac cath     a. LHC in 02/2010: normal cors  . Hx of echocardiogram 2014    Echo (10/14): Severe LVH, EF 60-65%, normal wall motion, grade 1 diastolic dysfunction, AVR functioning normally, mild aortic stenosis (mean 19), moderately dilated aorta, mild LAE, mild RVE  . Staphylococcus aureus bacteremia   . Prosthetic valve endocarditis   . Thyroid cancer   .  Heart murmur   . ESRD on dialysis     "[place]; TTS" (10/22/2013)    Current Outpatient Prescriptions  Medication Sig Dispense Refill  . amiodarone (PACERONE) 200 MG tablet Take 1 tablet (200 mg total) by mouth daily.  30 tablet  1  . aspirin EC 81 MG tablet Take 81 mg by mouth daily.       . clonazePAM (KLONOPIN) 1 MG tablet TAKE ONE TABLET BY MOUTH TWICE DAILY AS NEEDED FOR ANXIETY  15 tablet  0  . furosemide (LASIX) 40 MG tablet Take 1 tablet (40 mg total) by mouth daily.  30 tablet  2  . levothyroxine (SYNTHROID, LEVOTHROID) 75 MCG tablet Take 3 tablets (225 mcg total) by mouth daily before breakfast.  90 tablet  3  . lisinopril (PRINIVIL,ZESTRIL) 2.5 MG tablet Take 1 tablet (2.5 mg total) by mouth daily.  30 tablet  0  . metoprolol tartrate (LOPRESSOR) 12.5 mg TABS tablet Take 0.5 tablets (12.5 mg total) by mouth 2 (two) times daily.  60 tablet  2  . midodrine (PROAMATINE) 5 MG tablet Take 1 tablet (5 mg total) by mouth 2 (two) times daily with a meal.  60 tablet  1  . ondansetron (ZOFRAN-ODT) 4 MG disintegrating tablet Take 1 tablet (4 mg total) by mouth every 8 (eight) hours as needed for nausea or vomiting.  20 tablet  0  . sertraline (ZOLOFT) 50 MG tablet TAKE ONE TABLET BY MOUTH ONCE DAILY  30 tablet  3   No current facility-administered medications for this visit.    Allergies:   Oxycodone and Rifampin   Social History:  The patient  reports that he has never smoked. He has never used smokeless tobacco. He reports that he does not drink alcohol or use illicit drugs.   Family History:  The patient's family history includes Heart disease in his father; Kidney Stones in his sister; Thyroid cancer in his sister and sister.   ROS:  Please see the history of present illness.     All other systems reviewed and negative.   PHYSICAL EXAM: VS:  BP 80/50  Pulse 55  Ht 6\' 1"  (1.854 m)  Wt 171 lb (77.565 kg)  BMI 22.57 kg/m2 Well nourished, well developed, in no acute  distress HEENT: normal Neck: no JVD Cardiac:  normal S1, S2; RRR; 2/6 systolic murmurLUSB Lungs:  clear to auscultation bilaterally, no wheezing, rhonchi or rales Abd: soft, nontender, no hepatomegaly Ext: no edema Skin: warm and dry Neuro:  CNs 2-12 intact, no focal abnormalities noted   EKG:  Sinus bradycardia, HR 55, IVCD, no change from prior tracing    ASSESSMENT AND PLAN:  1. Chronic systolic heart failure:  Volume appears stable.  Blood pressure continues to run low. I will decrease his Lasix to 20 mg daily. We discussed the importance of daily weights and went take extra Lasix. 2. Non-ischemic cardiomyopathy- EF 20-25% - TTE 11/2013:  Continue beta blocker. Blood pressure has been running low. I have asked  him to hold his lisinopril unless his systolic blood pressure is over 110. He can try to take this at bedtime as well to see if he can tolerate.  Arrange followup echocardiogram in 2 months. 3. AVR- Bentall 2011, re-do March 2015 secondary to valve endocarditis:  Recent echo with stable AVR.  Continue follow up with [name] as directed.  F/u with ID as directed.  He completes his antibiotics next month. 4. VT- placed on Amiodarone March 2015:  Discontinue amiodarone. 5. Hypertension:  As noted, his blood pressures have run low. Adjust medications as noted.  6. HYPOTHYROIDISM:  F/u with PCP for follow up TSH.  Over time, he may not need as much Synthroid with discontinuation of his amiodarone. 7. CKD (chronic kidney disease):  He is now off of dialysis.  Recent creatinine looked good. 8. Disposition:  I have reviewed his case with Dr. Harrington Challenger who has agreed to see him in followup. Followup with Dr. Harrington Challenger in 2 months.    Signed, Versie Starks, MHS 01/15/2014 12:28 PM    [facility] [address], [place], [place]  [address] Phone: [phone]; Fax: [phone]

## 2014-01-16 ENCOUNTER — Encounter: Payer: Self-pay | Admitting: Infectious Disease

## 2014-01-20 ENCOUNTER — Other Ambulatory Visit: Payer: Self-pay

## 2014-01-20 MED ORDER — LISINOPRIL 2.5 MG PO TABS: 2.5000 mg | ORAL_TABLET | Freq: Every day | ORAL | Status: DC

## 2014-01-21 ENCOUNTER — Telehealth: Payer: Self-pay | Admitting: Family Medicine

## 2014-01-21 MED ORDER — LISINOPRIL 2.5 MG PO TABS: 2.5000 mg | ORAL_TABLET | Freq: Every day | ORAL | Status: DC

## 2014-01-21 NOTE — Telephone Encounter (Signed)
Pt's wife states pt was prescribed lisinopril (PRINIVIL,ZESTRIL) 2.5 MG tablet by the hospital and he needs a re-fill sent to [facility] on [address].

## 2014-01-21 NOTE — Telephone Encounter (Signed)
RX sent to pharmacy  

## 2014-01-28 ENCOUNTER — Encounter: Payer: Self-pay | Admitting: Internal Medicine

## 2014-01-28 ENCOUNTER — Ambulatory Visit (INDEPENDENT_AMBULATORY_CARE_PROVIDER_SITE_OTHER): Payer: Medicare Other | Admitting: Internal Medicine

## 2014-01-28 VITALS — BP 108/67 | HR 53 | Temp 98.1°F | Ht 73.0 in | Wt 175.0 lb

## 2014-01-28 DIAGNOSIS — I669 Occlusion and stenosis of unspecified cerebral artery: Secondary | ICD-10-CM

## 2014-01-28 DIAGNOSIS — R7881 Bacteremia: Secondary | ICD-10-CM

## 2014-01-28 DIAGNOSIS — A4901 Methicillin susceptible Staphylococcus aureus infection, unspecified site: Secondary | ICD-10-CM

## 2014-01-28 DIAGNOSIS — B9561 Methicillin susceptible Staphylococcus aureus infection as the cause of diseases classified elsewhere: Secondary | ICD-10-CM

## 2014-01-28 NOTE — Progress Notes (Signed)
Patient ID: Bradley [name], male   DOB: 1955/08/13, 58 y.o.   MRN: [id number]         [facility]  Patient Active Problem List   Diagnosis Date Noted  . Pulmonary edema 12/07/2013  . CHF exacerbation 12/07/2013  . H/O aortic valve repair- 1968 12/07/2013  . VT- placed on Amiodarone March 2015 12/07/2013  . Non-ischemic cardiomyopathy- EF 20-25% - TTE 11/2013 12/07/2013  . End stage renal disease 12/02/2013  . Swelling of limb 12/02/2013  . Normal coronary arteries 11/25/2013  . Elevated troponin- chronic- normal coronaries 11/25/2013  . Tachycardia 11/25/2013  . Severe protein-calorie malnutrition 11/24/2013  . Abdominal pain 11/23/2013  . Chronic combined systolic and diastolic CHF (congestive heart failure) 10/22/2013  . Prosthetic valve endocarditis   . ESRD transient dialysis, SCr now 1.45   . ST elevation (STEMI) myocardial infarction 09/17/2013  . Acute respiratory alkalosis 09/13/2013  . Acute renal failure 09/13/2013  . Encephalopathy acute 09/13/2013  . Staphylococcus aureus bacteremia with sepsis 09/13/2013  . Agitation 09/12/2013  . Acute diastolic heart failure [id number]  . NSTEMI (non-ST elevated myocardial infarction) 09/11/2013  . Sepsis 09/09/2013  . Altered mental state 09/09/2013  . Encephalitis 09/09/2013  . Septic embolism 09/09/2013  . HCAP (healthcare-associated pneumonia) 09/09/2013  . 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 Hypothyroidism, postsurgical   . Influenza 09/06/2013  . Major depressive disorder, recurrent, severe with psychotic features- hospitalized in Jan 2013 08/20/2011  . Generalized anxiety disorder 08/20/2011  . Hypotension 08/17/2011  . Suicidal ideation 08/17/2011  . LBBB (left bundle branch block) 05/05/2011  . Rotator cuff tear, right 02/17/2011  . AVR- Bentall 2011, re-do March 2015 secondary to valve endocarditis 12/29/2010  . Hypertension  12/29/2010  . WEIGHT LOSS 02/14/2010  . HYPOTHYROIDISM    03/05/2009  . CHEST PAIN, EXERTIONAL 03/05/2009    Patient's Medications  New Prescriptions   No medications on file  Previous Medications   ASPIRIN EC 81 MG TABLET    Take 81 mg by mouth daily.    CLONAZEPAM (KLONOPIN) 1 MG TABLET    TAKE ONE TABLET BY MOUTH TWICE DAILY AS NEEDED FOR ANXIETY   FUROSEMIDE (LASIX) 20 MG TABLET    Take 1 tablet (20 mg total) by mouth daily.   LEVOTHYROXINE (SYNTHROID, LEVOTHROID) 75 MCG TABLET    Take 3 tablets (225 mcg total) by mouth daily before breakfast.   METOPROLOL TARTRATE (LOPRESSOR) 12.5 MG TABS TABLET    Take 0.5 tablets (12.5 mg total) by mouth 2 (two) times daily.   MIDODRINE (PROAMATINE) 5 MG TABLET    Take 1 tablet (5 mg total) by mouth 2 (two) times daily with a meal.   ONDANSETRON (ZOFRAN-ODT) 4 MG DISINTEGRATING TABLET    Take 1 tablet (4 mg total) by mouth every 8 (eight) hours as needed for nausea or vomiting.   SERTRALINE (ZOLOFT) 50 MG TABLET    TAKE ONE TABLET BY MOUTH ONCE DAILY  Modified Medications   No medications on file  Discontinued Medications   LISINOPRIL (PRINIVIL,ZESTRIL) 2.5 MG TABLET    Take 1 tablet (2.5 mg total) by mouth daily.    Subjective: Bradley [name] is in for his followup visit with his wife and son. He has had a very complicated recent course with MSSA bacteremia in February complicated by prosthetic aortic valve endocarditis and septic emboli to the brain. He was initially treated with nafcillin, gentamicin and rifampin but developed renal failure and was on dialysis  for several months. He underwent redo aortic valve replacement in March. He completed his latest round of IV cefazolin on June 8. His hemodialysis catheter has been removed and his PICC has been removed. He is slowly feeling better. Has not had any fever, chills or sweats. His appetite and stamina are slowly recovering. He has had some problem with low blood pressures recently. Cardiology stopped his lisinopril on June 18 he still had some episodes where  his systolic blood pressure was brought into the high 80s. He has not had any syncope. Review of Systems: Pertinent items are noted in HPI.  Past Medical History  Diagnosis Date  . Anxiety   . Depression   . Hypertension   . Hypothyroidism, postsurgical   . Aortic stenosis     s/p Bentall with bioprosthetic AVR 02/2010; Last echo (9/11): Moderate LVH, EF 45-50%, AVR functioning appropriately, aortic valve mean gradient 21, diastolic dysfunction. Chest MRA (2/13): Mild to moderate dilatation at the sinus of Valsalva at 4.1 cm, mild dilatation ascending aorta distal to the tube graft at 3.9 cm, moderate dilatation of the innominate artery a 2.1 cm;    . Hx of cardiac cath     a. LHC in 02/2010: normal cors  . Hx of echocardiogram 2014    Echo (10/14): Severe LVH, EF 60-65%, normal wall motion, grade 1 diastolic dysfunction, AVR functioning normally, mild aortic stenosis (mean 19), moderately dilated aorta, mild LAE, mild RVE  . Staphylococcus aureus bacteremia   . Prosthetic valve endocarditis   . Thyroid cancer   . Heart murmur   . ESRD on dialysis     "Bradley [name]; TTS" (10/22/2013)    History  Substance Use Topics  . Smoking status: Never Smoker   . Smokeless tobacco: Never Used  . Alcohol Use: No     Comment: Occasional    Family History  Problem Relation Age of Onset  . Heart disease Father   . Thyroid cancer Sister   . Thyroid cancer Sister   . Kidney Stones Sister     Allergies  Allergen Reactions  . Oxycodone     Gives patient nightmares  . Rifampin Nausea Only    Objective: Temp: 98.1 F (36.7 C) (07/01 1532) Temp src: Oral (07/01 1532) BP: 108/67 mmHg (07/01 1532) Pulse Rate: 53 (07/01 1532)  General: He looks much better the more last saw him. His weight is up 9 pounds to 175 Skin: No rash. No splinter or conjunctival hemorrhages Lungs: Clear Cor: Regular S1 and S2 with a 2/6 systolic murmur. No diastolic murmurs noted Neuro: Alert with normal speech  and conversation  Lab Results  Component Value Date   WBC 5.2 01/06/2014   HGB 10.2* 01/06/2014   HCT 32.3* 01/06/2014   MCV 96.1 01/06/2014   PLT 156 01/06/2014   CMP     Component Value Date/Time   NA 145 01/06/2014 1815   K 4.2 01/06/2014 1815   CL 105 01/06/2014 1815   CO2 27 01/06/2014 1815   GLUCOSE 86 01/06/2014 1815   BUN 32* 01/06/2014 1815   CREATININE 1.16 01/06/2014 1815   CALCIUM 9.6 01/06/2014 1815   PROT 7.2 01/06/2014 1815   ALBUMIN 3.4* 01/06/2014 1815   AST 15 01/06/2014 1815   ALT <5 01/06/2014 1815   ALKPHOS 60 01/06/2014 1815   BILITOT <0.2* 01/06/2014 1815   GFRNONAA 69* 01/06/2014 1815   GFRAA 80* 01/06/2014 1815     Assessment: Mr. Schlepp has had an amazing recovery  from a very complicated MSSA infection.  Plan: 1. Continue observation off of antibiotics 2. He and his family to call if he has any signs of early relapse 3. Followup in one month   Michel Bickers, MD [facility] [phone] pager   [phone] cell 01/28/2014, 3:55 PM

## 2014-01-29 ENCOUNTER — Other Ambulatory Visit (INDEPENDENT_AMBULATORY_CARE_PROVIDER_SITE_OTHER): Payer: Medicare Other

## 2014-01-29 ENCOUNTER — Encounter: Payer: Self-pay | Admitting: *Deleted

## 2014-01-29 DIAGNOSIS — E039 Hypothyroidism, unspecified: Secondary | ICD-10-CM

## 2014-01-29 LAB — TSH: TSH: 0.07 u[IU]/mL — ABNORMAL LOW (ref 0.35–4.50)

## 2014-02-02 ENCOUNTER — Other Ambulatory Visit: Payer: Self-pay | Admitting: Family Medicine

## 2014-02-02 ENCOUNTER — Telehealth: Payer: Self-pay | Admitting: Internal Medicine

## 2014-02-02 ENCOUNTER — Other Ambulatory Visit: Payer: Self-pay

## 2014-02-02 DIAGNOSIS — E039 Hypothyroidism, unspecified: Secondary | ICD-10-CM

## 2014-02-02 MED ORDER — LEVOTHYROXINE SODIUM 50 MCG PO TABS: 50.0000 ug | ORAL_TABLET | Freq: Every day | ORAL | Status: DC

## 2014-02-02 NOTE — Telephone Encounter (Signed)
PT'S WIFE  AWARE   THAT   DR  BURCHETTE  WILL  FOLLOW  THYROID .Adonis Housekeeper

## 2014-02-02 NOTE — Telephone Encounter (Signed)
New message    Wife calling     Patient on thyroid medication & lab recently drawn.  Was told thyroid was low.     Need more clarification on lab recent / office wants to decrease medication from 225  mg to  50 ,mg .

## 2014-02-05 ENCOUNTER — Other Ambulatory Visit: Payer: Self-pay | Admitting: *Deleted

## 2014-02-05 ENCOUNTER — Telehealth: Payer: Self-pay | Admitting: *Deleted

## 2014-02-05 MED ORDER — MIDODRINE HCL 5 MG PO TABS: 5.0000 mg | ORAL_TABLET | Freq: Two times a day (BID) | ORAL | Status: DC

## 2014-02-05 NOTE — Telephone Encounter (Signed)
OK to refill MVI

## 2014-02-05 NOTE — Telephone Encounter (Signed)
foltrin (multivitamin) Ok to refill? Pt sees Dr. Harrington Challenger for follow up in August.

## 2014-02-05 NOTE — Telephone Encounter (Signed)
Ok to refill foltrin for patient? Please advise. Thanks, MI

## 2014-02-05 NOTE — Telephone Encounter (Signed)
?   [facility]. Forward to [employer], dr Harrington Challenger nurse

## 2014-02-06 ENCOUNTER — Other Ambulatory Visit: Payer: Self-pay | Admitting: *Deleted

## 2014-02-06 MED ORDER — FE FUMARATE-B12-VIT C-FA-IFC PO CAPS: 1.0000 | ORAL_CAPSULE | Freq: Every day | ORAL | Status: DC

## 2014-02-09 ENCOUNTER — Encounter: Payer: Self-pay | Admitting: Vascular Surgery

## 2014-02-10 ENCOUNTER — Other Ambulatory Visit (HOSPITAL_COMMUNITY): Payer: Self-pay

## 2014-02-10 ENCOUNTER — Encounter: Payer: Self-pay | Admitting: Vascular Surgery

## 2014-02-10 ENCOUNTER — Ambulatory Visit (HOSPITAL_COMMUNITY)
Admission: RE | Admit: 2014-02-10 | Discharge: 2014-02-10 | Disposition: A | Discharge status: Home / Self Care | Payer: Medicare Other | Source: Ambulatory Visit | Attending: Vascular Surgery | Admitting: Vascular Surgery

## 2014-02-10 ENCOUNTER — Ambulatory Visit: Payer: Self-pay | Admitting: Vascular Surgery

## 2014-02-10 ENCOUNTER — Ambulatory Visit (INDEPENDENT_AMBULATORY_CARE_PROVIDER_SITE_OTHER): Payer: Medicare Other | Admitting: Vascular Surgery

## 2014-02-10 VITALS — BP 92/59 | HR 58 | Temp 98.1°F | Resp 14 | Ht 73.0 in | Wt 175.0 lb

## 2014-02-10 DIAGNOSIS — Z4931 Encounter for adequacy testing for hemodialysis: Secondary | ICD-10-CM

## 2014-02-10 DIAGNOSIS — N182 Chronic kidney disease, stage 2 (mild): Secondary | ICD-10-CM

## 2014-02-10 DIAGNOSIS — N186 End stage renal disease: Secondary | ICD-10-CM

## 2014-02-10 NOTE — Progress Notes (Signed)
Subjective:     Patient ID: Bradley [name], male   DOB: 10-17-1955, 58 y.o.   MRN: [id number]  HPI this 58 year old male had left radial cephalic AV fistula created by me on 10/08/2013 he was on hemodialysis for 2 months but following coronary artery bypass grafting he has no longer required hemodialysis. He has some numbness and tingling in the left arm. He denies any left hand pain.  Past Medical History  Diagnosis Date  . Anxiety   . Depression   . Hypertension   . Hypothyroidism, postsurgical   . Aortic stenosis     s/p Bentall with bioprosthetic AVR 02/2010; Last echo (9/11): Moderate LVH, EF 45-50%, AVR functioning appropriately, aortic valve mean gradient 21, diastolic dysfunction. Chest MRA (2/13): Mild to moderate dilatation at the sinus of Valsalva at 4.1 cm, mild dilatation ascending aorta distal to the tube graft at 3.9 cm, moderate dilatation of the innominate artery a 2.1 cm;    . Hx of cardiac cath     a. LHC in 02/2010: normal cors  . Hx of echocardiogram 2014    Echo (10/14): Severe LVH, EF 60-65%, normal wall motion, grade 1 diastolic dysfunction, AVR functioning normally, mild aortic stenosis (mean 19), moderately dilated aorta, mild LAE, mild RVE  . Staphylococcus aureus bacteremia   . Prosthetic valve endocarditis   . Thyroid cancer   . Heart murmur   . ESRD on dialysis     "[place]; TTS" (10/22/2013)    History  Substance Use Topics  . Smoking status: Never Smoker   . Smokeless tobacco: Never Used  . Alcohol Use: No     Comment: Occasional    Family History  Problem Relation Age of Onset  . Heart disease Father   . Thyroid cancer Sister   . Thyroid cancer Sister   . Kidney Stones Sister     Allergies  Allergen Reactions  . Oxycodone     Gives patient nightmares  . Rifampin Nausea Only    Current outpatient prescriptions:aspirin EC 81 MG tablet, Take 81 mg by mouth daily. , Disp: , Rfl: ;  clonazePAM (KLONOPIN) 1 MG tablet, TAKE ONE TABLET BY  MOUTH TWICE DAILY AS NEEDED FOR ANXIETY, Disp: 15 tablet, Rfl: 0;  ferrous [id number] C-folic acid (TRINSICON / FOLTRIN) capsule, Take 1 capsule by mouth daily., Disp: 30 capsule, Rfl: 1 furosemide (LASIX) 20 MG tablet, Take 1 tablet (20 mg total) by mouth daily., Disp: 30 tablet, Rfl: 11;  levothyroxine (SYNTHROID, LEVOTHROID) 50 MCG tablet, Take 1 tablet (50 mcg total) by mouth daily before breakfast., Disp: 30 tablet, Rfl: 5;  metoprolol tartrate (LOPRESSOR) 12.5 mg TABS tablet, Take 0.5 tablets (12.5 mg total) by mouth 2 (two) times daily., Disp: 60 tablet, Rfl: 2 midodrine (PROAMATINE) 5 MG tablet, Take 1 tablet (5 mg total) by mouth 2 (two) times daily with a meal., Disp: 60 tablet, Rfl: 1;  ondansetron (ZOFRAN-ODT) 4 MG disintegrating tablet, Take 1 tablet (4 mg total) by mouth every 8 (eight) hours as needed for nausea or vomiting., Disp: 20 tablet, Rfl: 0;  sertraline (ZOLOFT) 50 MG tablet, TAKE ONE TABLET BY MOUTH ONCE DAILY, Disp: 30 tablet, Rfl: 3  BP 92/59  Pulse 58  Temp(Src) 98.1 F (36.7 C) (Oral)  Resp 14  Ht 6\' 1"  (1.854 m)  Wt 175 lb (79.379 kg)  BMI 23.09 kg/m2  SpO2 99%  Body mass index is 23.09 kg/(m^2).           Review of  Systems denies chest pain, dyspnea on exertion, PND, orthopnea, hemoptysis    Objective:   Physical Exam BP 92/59  Pulse 58  Temp(Src) 98.1 F (36.7 C) (Oral)  Resp 14  Ht 6\' 1"  (1.854 m)  Wt 175 lb (79.379 kg)  BMI 23.09 kg/m2  SpO2 99%  General well-developed well-nourished male in no apparent distress alert and oriented x3 Lungs no rhonchi or wheezing Left upper extremity with palpable pulse and thrill in left radial cephalic AV fistula.  Today I ordered a duplex scan of the fistula and also performed a bedside SonoSite independent exam. The fistula is patent but there is an area of occlusion and the cephalic vein in the lower third of the forearm the remainder of the cephalic vein is patent and fills by collaterals. The  flow is decreased in the proximal forearm cephalic vein but there is a palpable pulse in this area.  Patient has known occlusion of the upper arm cephalic vein on the left. He does have a patent basilic vein on the left.      Assessment:     Slowly maturing left radiocephalic AV fistula on patient who is no longer on hemodialysis I am not certain that this will mature to be usable as a site for access Patient currently has creatinine of 1.3 and he may not need access at this time depending on nephrologist desires Would recommend following his renal progress for the next 4 months and we will repeat a duplex scan at that time.    Plan:     If nephrology feels patient will definitely go on hemodialysis he may need a left basilic vein transposition the hate to commit him to that now since his creatinine is good and he is not on hemodialysis  Return in 4 months

## 2014-02-10 NOTE — Addendum Note (Signed)
Addended by: Mena Goes on: 02/10/2014 04:15 PM   Modules accepted: Orders

## 2014-02-13 ENCOUNTER — Encounter: Payer: Self-pay | Admitting: Family Medicine

## 2014-02-20 ENCOUNTER — Encounter: Payer: Self-pay | Admitting: Infectious Disease

## 2014-02-28 DIAGNOSIS — Z9289 Personal history of other medical treatment: Secondary | ICD-10-CM

## 2014-02-28 HISTORY — DX: Personal history of other medical treatment: Z92.89

## 2014-03-02 ENCOUNTER — Ambulatory Visit: Payer: Self-pay | Admitting: Internal Medicine

## 2014-03-04 ENCOUNTER — Ambulatory Visit: Payer: Medicare HMO | Admitting: Family Medicine

## 2014-03-09 ENCOUNTER — Encounter: Payer: Self-pay | Admitting: Family Medicine

## 2014-03-09 ENCOUNTER — Ambulatory Visit (INDEPENDENT_AMBULATORY_CARE_PROVIDER_SITE_OTHER): Payer: Commercial Managed Care - HMO | Admitting: Family Medicine

## 2014-03-09 VITALS — BP 120/80 | HR 67 | Temp 98.2°F | Wt 182.0 lb

## 2014-03-09 DIAGNOSIS — N186 End stage renal disease: Secondary | ICD-10-CM

## 2014-03-09 DIAGNOSIS — E039 Hypothyroidism, unspecified: Secondary | ICD-10-CM

## 2014-03-09 DIAGNOSIS — I5042 Chronic combined systolic (congestive) and diastolic (congestive) heart failure: Secondary | ICD-10-CM

## 2014-03-09 DIAGNOSIS — I509 Heart failure, unspecified: Secondary | ICD-10-CM

## 2014-03-09 LAB — BASIC METABOLIC PANEL
BUN: 25 mg/dL — ABNORMAL HIGH (ref 6–23)
CO2: 26 mEq/L (ref 19–32)
Calcium: 9.8 mg/dL (ref 8.4–10.5)
Chloride: 105 mEq/L (ref 96–112)
Creatinine, Ser: 1.6 mg/dL — ABNORMAL HIGH (ref 0.4–1.5)
GFR: 47.57 mL/min — ABNORMAL LOW (ref >60.00)
Glucose, Bld: 86 mg/dL (ref 70–99)
Potassium: 5.2 mEq/L — ABNORMAL HIGH (ref 3.5–5.1)
Sodium: 139 mEq/L (ref 135–145)

## 2014-03-09 NOTE — Progress Notes (Signed)
Subjective:    Patient ID: Bradley [name], male    DOB: 1955/09/06, 58 y.o.   MRN: [id number]  HPI Patient seen for medical followup. Very complicated past medical history. Refer to recent notes for details. 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 He has scheduled repeat echocardiogram in August. He denies any orthopnea. His appetite is improved. He has gained 11 pounds since last visit. No edema. No dyspnea with exertion. Overall feels stronger.  Hypothyroidism on replacement. Recent TSH 0.07. We reduced Synthroid to 50 mcg daily. Repeat TSH in one to 2 months. Remains on low-dose furosemide and metoprolol. He was unable to take ACE inhibitor because of some orthostatic symptoms. Those symptoms have improved. He was briefly on Midodrine but BP now stable off that.  Past Medical History  Diagnosis Date  . Anxiety   . Depression   . Hypertension   . Hypothyroidism, postsurgical   . Aortic stenosis     s/p Bentall with bioprosthetic AVR 02/2010; Last echo (9/11): Moderate LVH, EF 45-50%, AVR functioning appropriately, aortic valve mean gradient 21, diastolic dysfunction. Chest MRA (2/13): Mild to moderate dilatation at the sinus of Valsalva at 4.1 cm, mild dilatation ascending aorta distal to the tube graft at 3.9 cm, moderate dilatation of the innominate artery a 2.1 cm;    . Hx of cardiac cath     a. LHC in 02/2010: normal cors  . Hx of echocardiogram 2014    Echo (10/14): Severe LVH, EF 60-65%, normal wall motion, grade 1 diastolic dysfunction, AVR functioning normally, mild aortic stenosis (mean 19), moderately dilated aorta, mild LAE, mild RVE  . Staphylococcus aureus bacteremia   . Prosthetic valve endocarditis   . Thyroid cancer   . Heart murmur   . ESRD on dialysis     "[place]; TTS" (10/22/2013)   Past Surgical History  Procedure Laterality Date  . Cardiac  catheterization  03/02/2010    NORMAL CORONARY ARTERY  . Sternotomy      REDO  . Transthoracic echocardiogram  03/2010    SHOWED MILD REDUCTION OF LV FUNCTION  . Thyroidectomy  ~ 2005  . Shoulder arthroscopy w/ rotator cuff repair Right 2012  . Av fistula placement Left 10/08/2013    Procedure: ARTERIOVENOUS (AV) FISTULA CREATION- LEFT ARM; Radial Cephalic ;  Surgeon: Mal [name], MD;  Location: [place];  Service: Vascular;  Laterality: Left;  . Aortic valve replacement  2011  . Aortic valve repair  1968  . Tee without cardioversion N/A 10/24/2013    Procedure: TRANSESOPHAGEAL ECHOCARDIOGRAM (TEE);  Surgeon: Dorothy Spark, MD;  Location: [facility] ENDOSCOPY;  Service: Cardiovascular;  Laterality: N/A;  Deneen Harts procedure N/A 10/28/2013    Procedure: REDO BENTALL PROCEDURE, debridment of aoritc root abscess, replacement of aortic root, ascending aorta and aortic valve with homograft. Insertion of left femoral arterial line;  Surgeon: [name], MD;  Location: [place];  Service: Open Heart Surgery;  Laterality: N/A;  . Intraoperative transesophageal echocardiogram N/A 10/28/2013    Procedure: INTRAOPERATIVE TRANSESOPHAGEAL ECHOCARDIOGRAM;  Surgeon: [name], MD;  Location: [place];  Service: Open Heart Surgery;  Laterality: N/A;  . Cardiac valve replacement      reports that he has never smoked. He has never used smokeless tobacco. He reports that he does not drink alcohol or use illicit drugs. family history includes Heart disease in his father; Kidney Stones in his sister; Thyroid cancer  in his sister and sister. Allergies  Allergen Reactions  . Oxycodone     Gives patient nightmares  . Rifampin Nausea Only      Review of Systems  Constitutional: Negative for fatigue.  Eyes: Negative for visual disturbance.  Respiratory: Negative for cough, chest tightness and shortness of breath.   Cardiovascular: Negative for chest pain, palpitations and leg swelling.  Neurological:  Negative for dizziness, syncope, weakness, light-headedness and headaches.       Objective:   Physical Exam  Constitutional: He is oriented to person, place, and time. He appears well-developed and well-nourished.  HENT:  Right Ear: External ear normal.  Left Ear: External ear normal.  Mouth/Throat: Oropharynx is clear and moist.  Eyes: Pupils are equal, round, and reactive to light.  Neck: Neck supple. No thyromegaly present.  Cardiovascular: Normal rate and regular rhythm.   Pulmonary/Chest: Effort normal and breath sounds normal. No respiratory distress. He has no wheezes. He has no rales.  Musculoskeletal: He exhibits no edema.  Neurological: He is alert and oriented to person, place, and time.          Assessment & Plan:  #1 hypothyroidism. Recently over replaced with adjustment in Synthroid. Recheck TSH in one to 2 months #2 chronic kidney disease. Recheck basic metabolic panel #3 combined systolic and diastolic heart failure. Patient continues to have low blood pressure but no active orthostatic symptoms. Blood pressure today repeat sitting right arm seated 108/62.  #4 health maintenance. We've recommended flu vaccine this fall.

## 2014-03-09 NOTE — Patient Instructions (Signed)
Schedule thyroid blood test for late September or October.

## 2014-03-09 NOTE — Progress Notes (Signed)
Pre visit review using our clinic review tool, if applicable. No additional management support is needed unless otherwise documented below in the visit note. 

## 2014-03-18 ENCOUNTER — Other Ambulatory Visit: Payer: Self-pay | Admitting: Family Medicine

## 2014-03-18 DIAGNOSIS — E875 Hyperkalemia: Secondary | ICD-10-CM

## 2014-03-19 ENCOUNTER — Ambulatory Visit (HOSPITAL_COMMUNITY): Payer: Medicare HMO | Attending: Cardiovascular Disease | Admitting: Radiology

## 2014-03-19 ENCOUNTER — Ambulatory Visit (INDEPENDENT_AMBULATORY_CARE_PROVIDER_SITE_OTHER): Payer: Commercial Managed Care - HMO | Admitting: Internal Medicine

## 2014-03-19 ENCOUNTER — Encounter: Payer: Self-pay | Admitting: Internal Medicine

## 2014-03-19 VITALS — BP 117/71 | HR 74 | Temp 98.3°F | Ht 73.0 in | Wt 183.0 lb

## 2014-03-19 DIAGNOSIS — I10 Essential (primary) hypertension: Secondary | ICD-10-CM

## 2014-03-19 DIAGNOSIS — I509 Heart failure, unspecified: Secondary | ICD-10-CM | POA: Insufficient documentation

## 2014-03-19 DIAGNOSIS — B9561 Methicillin susceptible Staphylococcus aureus infection as the cause of diseases classified elsewhere: Secondary | ICD-10-CM

## 2014-03-19 DIAGNOSIS — Z23 Encounter for immunization: Secondary | ICD-10-CM

## 2014-03-19 DIAGNOSIS — I359 Nonrheumatic aortic valve disorder, unspecified: Secondary | ICD-10-CM | POA: Diagnosis not present

## 2014-03-19 DIAGNOSIS — I5031 Acute diastolic (congestive) heart failure: Secondary | ICD-10-CM

## 2014-03-19 DIAGNOSIS — R7881 Bacteremia: Secondary | ICD-10-CM

## 2014-03-19 DIAGNOSIS — A4901 Methicillin susceptible Staphylococcus aureus infection, unspecified site: Secondary | ICD-10-CM

## 2014-03-19 NOTE — Progress Notes (Signed)
Patient ID: Bradley [name], male   DOB: [birth year], 57 y.o.   MRN: [id number]         [facility] for Infectious Disease  Patient Active Problem List   Diagnosis Date Noted  . Pulmonary edema 12/07/2013  . CHF exacerbation 12/07/2013  . H/O aortic valve repair- 1968 12/07/2013  . VT- placed on Amiodarone March 2015 12/07/2013  . Non-ischemic cardiomyopathy- EF 20-25% - TTE 11/2013 12/07/2013  . End stage renal disease 12/02/2013  . Swelling of limb 12/02/2013  . Normal coronary arteries 11/25/2013  . Elevated troponin- chronic- normal coronaries 11/25/2013  . Tachycardia 11/25/2013  . Severe protein-calorie malnutrition 11/24/2013  . Abdominal pain 11/23/2013  . Chronic combined systolic and diastolic CHF (congestive heart failure) 10/22/2013  . Prosthetic valve endocarditis   . ESRD transient dialysis, SCr now 1.45   . ST elevation (STEMI) myocardial infarction 09/17/2013  . Acute respiratory alkalosis 09/13/2013  . Acute renal failure 09/13/2013  . Encephalopathy acute 09/13/2013  . Staphylococcus aureus bacteremia with sepsis 09/13/2013  . Agitation 09/12/2013  . Acute diastolic heart failure [id number]  . NSTEMI (non-ST elevated myocardial infarction) 09/11/2013  . Sepsis 09/09/2013  . Altered mental state 09/09/2013  . Encephalitis 09/09/2013  . Septic embolism 09/09/2013  . HCAP (healthcare-associated pneumonia) 09/09/2013  . 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 Hypothyroidism, postsurgical   . Influenza 09/06/2013  . Major depressive disorder, recurrent, severe with psychotic features- hospitalized in Jan 2013 08/20/2011  . Generalized anxiety disorder 08/20/2011  . Hypotension 08/17/2011  . Suicidal ideation 08/17/2011  . LBBB (left bundle branch block) 05/05/2011  . Rotator cuff tear, right 02/17/2011  . AVR- Bentall 2011, re-do March 2015 secondary to valve endocarditis 12/29/2010  . Hypertension  12/29/2010  . WEIGHT LOSS 02/14/2010  . HYPOTHYROIDISM    03/05/2009  . CHEST PAIN, EXERTIONAL 03/05/2009    Patient's Medications  New Prescriptions   No medications on file  Previous Medications   ASPIRIN EC 81 MG TABLET    Take 81 mg by mouth daily.    CLONAZEPAM (KLONOPIN) 1 MG TABLET    TAKE ONE TABLET BY MOUTH TWICE DAILY AS NEEDED FOR ANXIETY   FERROUS [id number] C-FOLIC ACID (TRINSICON / FOLTRIN) CAPSULE    Take 1 capsule by mouth daily.   FUROSEMIDE (LASIX) 20 MG TABLET    Take 1 tablet (20 mg total) by mouth daily.   LEVOTHYROXINE (SYNTHROID, LEVOTHROID) 50 MCG TABLET    Take 1 tablet (50 mcg total) by mouth daily before breakfast.   METOPROLOL TARTRATE (LOPRESSOR) 12.5 MG TABS TABLET    Take 0.5 tablets (12.5 mg total) by mouth 2 (two) times daily.   MIDODRINE (PROAMATINE) 5 MG TABLET    Take 1 tablet (5 mg total) by mouth 2 (two) times daily with a meal.   ONDANSETRON (ZOFRAN-ODT) 4 MG DISINTEGRATING TABLET    Take 1 tablet (4 mg total) by mouth every 8 (eight) hours as needed for nausea or vomiting.   SERTRALINE (ZOLOFT) 50 MG TABLET    TAKE ONE TABLET BY MOUTH ONCE DAILY  Modified Medications   No medications on file  Discontinued Medications   No medications on file    Subjective: Bradley [name] is in with his wife for his routine followup visit. He has now been off of antibiotics for 2-1/2 months after lengthy therapy for severe MSSA endocarditis. He continues to feel stronger each week. He has resumed most of his usual activities and is getting stronger. His  appetite has improved and he has gained weight. He has not had any fever, chills or sweats. He had a repeat echocardiogram earlier this morning. Review of Systems: Pertinent items are noted in HPI.  Past Medical History  Diagnosis Date  . Anxiety   . Depression   . Hypertension   . Hypothyroidism, postsurgical   . Aortic stenosis     s/p Bentall with bioprosthetic AVR 02/2010; Last echo (9/11): Moderate LVH, EF 45-50%, AVR functioning appropriately, aortic valve  mean gradient 21, diastolic dysfunction. Chest MRA (2/13): Mild to moderate dilatation at the sinus of Valsalva at 4.1 cm, mild dilatation ascending aorta distal to the tube graft at 3.9 cm, moderate dilatation of the innominate artery a 2.1 cm;    . Hx of cardiac cath     a. LHC in 02/2010: normal cors  . Hx of echocardiogram 2014    Echo (10/14): Severe LVH, EF 60-65%, normal wall motion, grade 1 diastolic dysfunction, AVR functioning normally, mild aortic stenosis (mean 19), moderately dilated aorta, mild LAE, mild RVE  . Staphylococcus aureus bacteremia   . Prosthetic valve endocarditis   . Thyroid cancer   . Heart murmur   . ESRD on dialysis     "[place]; TTS" (10/22/2013)    History  Substance Use Topics  . Smoking status: Never Smoker   . Smokeless tobacco: Never Used  . Alcohol Use: No     Comment: Occasional    Family History  Problem Relation Age of Onset  . Heart disease Father   . Thyroid cancer Sister   . Thyroid cancer Sister   . Kidney Stones Sister     Allergies  Allergen Reactions  . Oxycodone     Gives patient nightmares  . Rifampin Nausea Only    Objective: Temp: 98.3 F (36.8 C) (08/20 1356) Temp src: Oral (08/20 1356) BP: 117/71 mmHg (08/20 1356) Pulse Rate: 74 (08/20 1356)  General: He is talkative and in very good spirits Skin: Greace under his fingernails from working on his cars Lungs: Clear Cor: Regular S1 and S2 with early 2/6 systolic murmur   Assessment: It appears that his MSSA infection has been cured.  Plan: 1. Continue observation off of antibiotics 2. Followup here as needed   Michel Bickers, MD [facility] for [place] [phone] pager   [phone] cell 03/19/2014, 2:21 PM

## 2014-03-19 NOTE — Progress Notes (Signed)
Echocardiogram performed.  

## 2014-03-22 ENCOUNTER — Encounter: Payer: Self-pay | Admitting: Physician Assistant

## 2014-03-23 ENCOUNTER — Ambulatory Visit (INDEPENDENT_AMBULATORY_CARE_PROVIDER_SITE_OTHER): Payer: Commercial Managed Care - HMO | Admitting: Internal Medicine

## 2014-03-23 VITALS — BP 124/78 | HR 68 | Ht 72.0 in | Wt 185.0 lb

## 2014-03-23 DIAGNOSIS — E039 Hypothyroidism, unspecified: Secondary | ICD-10-CM

## 2014-03-23 DIAGNOSIS — I428 Other cardiomyopathies: Secondary | ICD-10-CM

## 2014-03-23 DIAGNOSIS — I10 Essential (primary) hypertension: Secondary | ICD-10-CM

## 2014-03-23 LAB — BASIC METABOLIC PANEL
BUN: 31 mg/dL — ABNORMAL HIGH (ref 6–23)
CO2: 28 mEq/L (ref 19–32)
Calcium: 9.5 mg/dL (ref 8.4–10.5)
Chloride: 104 mEq/L (ref 96–112)
Creatinine, Ser: 1.3 mg/dL (ref 0.4–1.5)
GFR: 59.39 mL/min — ABNORMAL LOW (ref >60.00)
Glucose, Bld: 77 mg/dL (ref 70–99)
Potassium: 5.5 mEq/L — ABNORMAL HIGH (ref 3.5–5.1)
Sodium: 140 mEq/L (ref 135–145)

## 2014-03-23 LAB — TSH: TSH: 92.72 u[IU]/mL — ABNORMAL HIGH (ref 0.35–4.50)

## 2014-03-23 LAB — T4, FREE: Free T4: 0.48 ng/dL — ABNORMAL LOW (ref 0.60–1.60)

## 2014-03-23 LAB — T3, FREE: T3, Free: 1.6 pg/mL — ABNORMAL LOW (ref 2.3–4.2)

## 2014-03-23 NOTE — Progress Notes (Signed)
HPIHerbert F [name] is a 58 y.o. male with a history of aortic stenosis requiring aortic valvulotomy at age 35 and subsequent Bentall procedure in 02/2000 with placement of a pericardial tissue valve. Cardiac catheterization 02/2010 demonstrated normal coronary arteries. Other history includes LBBB, thyroid cancer, status post thyroidectomy in 2005, surgical hypothyroidism, HTN, depression, chronically elevated troponins.  He was admitted in 08/2013 with MSSA bacteremia and bacterial endocarditis with evidence of cerebral emboli. He had worsening renal function (possibly from gentamicin) and was started on hemodialysis. 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 He was found to have a perivalvular abscess on TEE and cardiac CTA and ultimately underwent redo Bentall procedure with homograft and debridement of root abscess. He required amiodarone in the OR for VT/VF (required multiple shocks). He continued to require hemodialysis. He had a prolonged hospitalization and was d/c 11/13/13.  Admitted [id number] with a/c systolic CHF. Echo demonstrated worsening LVF with EF 20-25%. ACEI and beta blocker were started. Cardiac CTA prior to his redo Bentall demonstrated normal cors. Of note, TSH was over 50 and his synthroid was adjusted.  Last seen here 12/24/13. I have since reviewed his case with [name] and [name] who both felt that he no longer needed Amiodarone. BP has been low and has limited titration of his CHF medications. He returns for follow up. Since last seen, he has been doing well. Blood pressure does continue to run low. He does feel lightheaded and nauseated with this. Otherwise, denies chest pain, significant shortness of breath, orthopnea, PND or significant edema.   He was seen by [name] earlier this sping.  He says his breathing is good  He denies CP  No PND  No orthopnea    Studies:   - LHC (02/2010): Normal cors  - TEE (09/2013): EF 45-50%  - Echo  (12/08/13): Mild LVH, EF 20-25%, anteroseptal HK, AVR ok, mild MR, mild LAE, mild RVE, mild reduced RVSF, mild RAE, mild to mod TR, PASP 31 mmHg.  - Carotid US (09/2013): Bilateral ICA 1-39%  Cardiac CTA (10/26/13:  IMPRESSION: 1. There is a large paravalvular abscess around the root of the prosthetic aortic valve measuring 58 x 45 x 21 mm located antero-bilaterally (sparing posterior portion of the aortic root) withdynamic obstruction of RCA in systole. 2. Dilated proximal portion of the aortic arch (post graft) measuring 41 x 40 mm. 3. Normal coronaries. Right dominance.     Allergies  Allergen Reactions  . Oxycodone     Gives patient nightmares  . Rifampin Nausea Only    Current Outpatient Prescriptions  Medication Sig Dispense Refill  . aspirin EC 81 MG tablet Take 81 mg by mouth daily.       . clonazePAM (KLONOPIN) 1 MG tablet TAKE ONE TABLET BY MOUTH TWICE DAILY AS NEEDED FOR ANXIETY  15 tablet  0  . ferrous [id number] C-folic acid (TRINSICON / FOLTRIN) capsule Take 1 capsule by mouth daily.  30 capsule  1  . furosemide (LASIX) 20 MG tablet Take 1 tablet (20 mg total) by mouth daily.  30 tablet  11  . levothyroxine (SYNTHROID, LEVOTHROID) 50 MCG tablet Take 1 tablet (50 mcg total) by mouth daily before breakfast.  30 tablet  5  . metoprolol tartrate (LOPRESSOR) 12.5 mg TABS tablet Take 0.5 tablets (12.5 mg total) by mouth 2 (two) times daily.  60 tablet  2  . midodrine (PROAMATINE) 5 MG tablet Take 1 tablet (5 mg total) by mouth  2 (two) times daily with a meal.  60 tablet  1  . ondansetron (ZOFRAN-ODT) 4 MG disintegrating tablet Take 1 tablet (4 mg total) by mouth every 8 (eight) hours as needed for nausea or vomiting.  20 tablet  0  . sertraline (ZOLOFT) 50 MG tablet TAKE ONE TABLET BY MOUTH ONCE DAILY  30 tablet  3   No current facility-administered medications for this visit.    Past Medical History  Diagnosis Date  . Anxiety   . Depression   . Hypertension   .  Hypothyroidism, postsurgical   . Aortic stenosis     s/p Bentall with bioprosthetic AVR 02/2010; Last echo (9/11): Moderate LVH, EF 45-50%, AVR functioning appropriately, aortic valve mean gradient 21, diastolic dysfunction. Chest MRA (2/13): Mild to moderate dilatation at the sinus of Valsalva at 4.1 cm, mild dilatation ascending aorta distal to the tube graft at 3.9 cm, moderate dilatation of the innominate artery a 2.1 cm;    . Hx of cardiac cath     a. LHC in 02/2010: normal cors  . Hx of echocardiogram 2014    Echo (10/14): Severe LVH, EF 60-65%, normal wall motion, grade 1 diastolic dysfunction, AVR functioning normally, mild aortic stenosis (mean 19), moderately dilated aorta, mild LAE, mild RVE  . Staphylococcus aureus bacteremia   . Prosthetic valve endocarditis   . Thyroid cancer   . Heart murmur   . ESRD on dialysis     "[place]; TTS" (10/22/2013)  . Hx of echocardiogram 02/2014    Echo (8/15):  Mod LV, EF 35-40%, Gr 1 DD, AVR ok (mean 14 mmHg), mild LAE    Past Surgical History  Procedure Laterality Date  . Cardiac catheterization  03/02/2010    NORMAL CORONARY ARTERY  . Sternotomy      REDO  . Transthoracic echocardiogram  03/2010    SHOWED MILD REDUCTION OF LV FUNCTION  . Thyroidectomy  ~ 2005  . Shoulder arthroscopy w/ rotator cuff repair Right 2012  . Av fistula placement Left 10/08/2013    Procedure: ARTERIOVENOUS (AV) FISTULA CREATION- LEFT ARM; Radial Cephalic ;  Surgeon: Mal [name], MD;  Location: [place];  Service: Vascular;  Laterality: Left;  . Aortic valve replacement  2011  . Aortic valve repair  1968  . Tee without cardioversion N/A 10/24/2013    Procedure: TRANSESOPHAGEAL ECHOCARDIOGRAM (TEE);  Surgeon: Dorothy Spark, MD;  Location[facility] ENDOSCOPY;  Service: Cardiovascular;  Laterality: N/A;  Deneen Harts procedure N/A 10/28/2013    Procedure: REDO BENTALL PROCEDURE, debridment of aoritc root abscess, replacement of aortic root, ascending aorta and aortic  valve with homograft. Insertion of left femoral arterial line;  Surgeon: [name], MD;  Location: [place];  Service: Open Heart Surgery;  Laterality: N/A;  . Intraoperative transesophageal echocardiogram N/A 10/28/2013    Procedure: INTRAOPERATIVE TRANSESOPHAGEAL ECHOCARDIOGRAM;  Surgeon: [name], MD;  Location: [place];  Service: Open Heart Surgery;  Laterality: N/A;  . Cardiac valve replacement      Family History  Problem Relation Age of Onset  . Heart disease Father   . Thyroid cancer Sister   . Thyroid cancer Sister   . Kidney Stones Sister     History   Social History  . Marital Status: Married    Spouse Name: N/A    Number of Children: N/A  . Years of Education: N/A   Occupational History  . Not on file.   Social History Main Topics  .  Smoking status: Never Smoker   . Smokeless tobacco: Never Used  . Alcohol Use: No     Comment: Occasional  . Drug Use: No  . Sexual Activity: No   Other Topics Concern  . Not on file   Social History Narrative   Lives at home with walker.  Does not drive.  ESRD T, H, Sa.  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  They are negative to the above problem except as previously stated.  Vital Signs: BP 124/78  Pulse 68  Ht 6' (1.829 m)  Wt 185 lb (83.915 kg)  BMI 25.08 kg/m2  Physical Exam  HEENT:  Normocephalic, atraumatic. EOMI, PERRLA.  Neck: JVP is normal.  No bruits.  Lungs: clear to auscultation. No rales no wheezes.  Heart: Regular rate and rhythm. Normal S1, S2. No S3.   II/VI systolic murmur at base   PMI not displaced.  Abdomen:  Supple, nontender. Normal bowel sounds. No masses. No hepatomegaly.  Extremities:   Good distal pulses throughout. No lower extremity edema.  Musculoskeletal :moving all extremities.  Neuro:   alert and oriented x3.  CN II-XII grossly intact.   Assessment and Plan: 1.  Endocarditis  Seen in ID clinic recently  Has finished Rx.    2.   CHronic systolic CHF  Volume status looks good  Wil repeat BMET  LVEF is improved from previous  Depending on labs will adjust meds.    3.  HTN  FOllow    4.  Thyroid  Check values

## 2014-03-23 NOTE — Patient Instructions (Signed)
Your physician recommends that you have lab work today: BMP, TSH, Free T3 and T4  Your physician recommends that you schedule a follow-up appointment in: December with Dr Harrington Challenger  Your physician recommends that you continue on your current medications as directed. Please refer to the Current Medication list given to you today.

## 2014-03-24 ENCOUNTER — Other Ambulatory Visit: Payer: Self-pay | Admitting: Cardiothoracic Surgery

## 2014-03-24 DIAGNOSIS — J811 Chronic pulmonary edema: Secondary | ICD-10-CM

## 2014-03-26 ENCOUNTER — Encounter: Payer: Self-pay | Admitting: Cardiothoracic Surgery

## 2014-03-26 ENCOUNTER — Ambulatory Visit (INDEPENDENT_AMBULATORY_CARE_PROVIDER_SITE_OTHER): Payer: Commercial Managed Care - HMO | Admitting: Cardiothoracic Surgery

## 2014-03-26 ENCOUNTER — Ambulatory Visit
Admission: RE | Admit: 2014-03-26 | Discharge: 2014-03-26 | Disposition: A | Discharge status: Home / Self Care | Payer: Medicare HMO | Source: Ambulatory Visit | Attending: Cardiothoracic Surgery | Admitting: Cardiothoracic Surgery

## 2014-03-26 VITALS — BP 105/73 | HR 60 | Ht 72.0 in | Wt 185.0 lb

## 2014-03-26 DIAGNOSIS — I330 Acute and subacute infective endocarditis: Secondary | ICD-10-CM

## 2014-03-26 DIAGNOSIS — J811 Chronic pulmonary edema: Secondary | ICD-10-CM

## 2014-03-26 DIAGNOSIS — I5189 Other ill-defined heart diseases: Secondary | ICD-10-CM

## 2014-03-26 NOTE — Progress Notes (Signed)
[address]       [place] [address]             [phone]                  Evrett F Costley [facility] Medical Record [id number] Date of Birth: 09-Nov-1955  Referring MD:Van Dam, Lavell Islam, MD Primary Cardiology: Primary Yetta Flock, MD  Chief Complaint:  Follow Up Visit 10/28/2013  OPERATIVE REPORT  PREOPERATIVE DIAGNOSIS: Aortic root abscess.  POSTOPERATIVE DIAGNOSIS: Aortic root abscess.  SURGICAL PROCEDURE: Redo sternotomy, redo Bentall replacement of aortic  valve, aortic root, and ascending aorta with reimplantation of coronary  arteries using a homograft with debridement of aortic valve abscess,  placement of left femoral arterial line.  SURGEON: Lanelle Bal, MD   History of Present Illness:     Patient returns to the office today in followup after prolonged hospitalization with prosthetic valve endocarditis and aortic root abscess. He underwent redo Bentall replacement of his aortic valve and aortic root and ascending aorta with a homograft because of extensive abscess formation. Marland Kitchen He comes into the office today and notes he feels better than he has in weeks. He's increasing his physical activity and overall strength appropriately. He developed renal failure after the use of gentamicin and February of this year, after surgery his renal function improved and he is currently not on dialysis.  Overall the patient feels much improved, he's been off antibiotics since June. He's had no recurrent fever chills. Followup echo cardiogram was done earlier this week.   Zubrod Score: At the time of surgery this patient's most appropriate activity status/level should be described as: []     0    Normal activity, no symptoms [x]     1    Restricted in physical strenuous activity but ambulatory, able to do out light work []     2    Ambulatory and capable of self care, unable to do work activities, up and about                 >50 % of waking hours                                                                                    []     3    Only limited self care, in bed greater than 50% of waking hours []     4    Completely disabled, no self care, confined to bed or chair []     5    Moribund  History  Smoking status  . Never Smoker   Smokeless tobacco  . Never Used       Allergies  Allergen Reactions  . Oxycodone     Gives patient nightmares  . Rifampin Nausea Only    Current Outpatient Prescriptions  Medication Sig Dispense Refill  . aspirin EC 81 MG tablet Take 81 mg by mouth daily.       . clonazePAM (KLONOPIN) 1 MG tablet TAKE ONE TABLET BY MOUTH TWICE DAILY AS NEEDED FOR ANXIETY  15 tablet  0  . ferrous Q000111Q C-folic acid (TRINSICON / FOLTRIN) capsule  Take 1 capsule by mouth daily.  30 capsule  1  . furosemide (LASIX) 20 MG tablet Take 1 tablet (20 mg total) by mouth daily.  30 tablet  11  . levothyroxine (SYNTHROID, LEVOTHROID) 50 MCG tablet Take 1 tablet (50 mcg total) by mouth daily before breakfast.  30 tablet  5  . metoprolol tartrate (LOPRESSOR) 12.5 mg TABS tablet Take 0.5 tablets (12.5 mg total) by mouth 2 (two) times daily.  60 tablet  2  . midodrine (PROAMATINE) 5 MG tablet Take 1 tablet (5 mg total) by mouth 2 (two) times daily with a meal.  60 tablet  1  . sertraline (ZOLOFT) 50 MG tablet TAKE ONE TABLET BY MOUTH ONCE DAILY  30 tablet  3  . ondansetron (ZOFRAN-ODT) 4 MG disintegrating tablet Take 1 tablet (4 mg total) by mouth every 8 (eight) hours as needed for nausea or vomiting.  20 tablet  0   No current facility-administered medications for this visit.       Physical Exam: BP 105/73  Pulse 60  Ht 6' (1.829 m)  Wt 185 lb (83.915 kg)  BMI 25.08 kg/m2  SpO2 97%  General appearance: alert, cooperative, appears stated age and no distress Neurologic: intact Heart: regular rate and rhythm, S1, S2 normal, no murmur, click, rub or gallop Lungs: clear to auscultation  bilaterally Abdomen: soft, non-tender; bowel sounds normal; no masses,  no organomegaly Extremities: extremities normal, atraumatic, no cyanosis or edema and Homans sign is negative, no sign of DVT Wound: Sternum is stable and well-healed, central line and left subclavian vein is in place and being used for antibiotics There is a palpable thrill in the patient's left forearm fistula, not much development of the vein. The swelling in the left arm is much improved  Diagnostic Studies & Laboratory data:         Recent Radiology Findings: Dg Chest 2 View  03/26/2014   CLINICAL DATA:  Pulmonary edema and valve replacement.  EXAM: CHEST  2 VIEW  COMPARISON:  01/06/2014  FINDINGS: Prior median sternotomy. Midline trachea. Mild cardiomegaly with a tortuous thoracic aorta. No pleural effusion or pneumothorax. Mild left hemidiaphragm elevation. No congestive failure. Interval removal of left-sided central line. Increased density projecting over the anterior left third rib is due to a healing fracture when correlated with CT of 10/26/2013.  IMPRESSION: Mild cardiomegaly, without congestive failure or acute disease.   Electronically Signed   By: Abigail Miyamoto M.D.   On: 03/26/2014 13:29      Recent Labs: Lab Results  Component Value Date   WBC 5.2 01/06/2014   HGB 10.2* 01/06/2014   HCT 32.3* 01/06/2014   PLT 156 01/06/2014   GLUCOSE 77 03/23/2014   CHOL 145 09/25/2013   TRIG 156* 09/25/2013   HDL 27* 09/25/2013   LDLCALC 87 09/25/2013   ALT <5 01/06/2014   AST 15 01/06/2014   NA 140 03/23/2014   K 5.5* 03/23/2014   CL 104 03/23/2014   CREATININE 1.3 03/23/2014   BUN 31* 03/23/2014   CO2 28 03/23/2014   TSH 92.72* 03/23/2014   INR 1.51* 10/29/2013   HGBA1C 4.9 10/27/2013   ECHO:02/2014 Study Conclusions  - Left ventricle: The cavity size was normal. Wall thickness was increased in a pattern of moderate LVH. Systolic function was moderately reduced. The estimated ejection fraction was in the range of 35% to 40%.  Doppler parameters are consistent with abnormal left ventricular relaxation (grade 1 diastolic dysfunction). - Aortic valve: Mildly  calcified annulus. There was trivial regurgitation. Mean gradient (S): 14 mm Hg. Peak gradient (S): 26 mm Hg. - Left atrium: The atrium was mildly dilated.     Assessment / Plan:      Patient stable following redo aortic valve and ascending aorta for root abscess. Overall he appears to be improving.  Echocardiogram shows improved LV function and no evidence of aortic insufficiency. Overall from a surgical standpoint he seems to be making very good progress. Plan to see him back when necessary     Taliana Mersereau B 03/26/2014 4:23 PM

## 2014-03-30 ENCOUNTER — Ambulatory Visit (INDEPENDENT_AMBULATORY_CARE_PROVIDER_SITE_OTHER): Payer: Commercial Managed Care - HMO | Admitting: *Deleted

## 2014-03-30 ENCOUNTER — Telehealth: Payer: Self-pay | Admitting: Internal Medicine

## 2014-03-30 DIAGNOSIS — I10 Essential (primary) hypertension: Secondary | ICD-10-CM | POA: Diagnosis not present

## 2014-03-30 DIAGNOSIS — E876 Hypokalemia: Secondary | ICD-10-CM

## 2014-03-30 DIAGNOSIS — R7989 Other specified abnormal findings of blood chemistry: Secondary | ICD-10-CM

## 2014-03-30 LAB — BASIC METABOLIC PANEL
BUN: 32 mg/dL — ABNORMAL HIGH (ref 6–23)
CO2: 28 mEq/L (ref 19–32)
Calcium: 9.1 mg/dL (ref 8.4–10.5)
Chloride: 104 mEq/L (ref 96–112)
Creatinine, Ser: 1.4 mg/dL (ref 0.4–1.5)
GFR: 57.87 mL/min — ABNORMAL LOW (ref >60.00)
Glucose, Bld: 86 mg/dL (ref 70–99)
Potassium: 4.3 mEq/L (ref 3.5–5.1)
Sodium: 139 mEq/L (ref 135–145)

## 2014-03-30 MED ORDER — LEVOTHYROXINE SODIUM 75 MCG PO TABS: 75.0000 ug | ORAL_TABLET | Freq: Every day | ORAL | Status: DC

## 2014-03-30 NOTE — Telephone Encounter (Signed)
New message     Patient wife calling stating someone call the home today regarding her husband labs

## 2014-03-30 NOTE — Telephone Encounter (Signed)
Follow up  ° ° °Wife calling back to speak with nurse.   °

## 2014-03-30 NOTE — Telephone Encounter (Signed)
Message copied by Rodman Key on Mon Mar 30, 2014  1:16 PM ------      Message from: [facility]      Created: Tue Mar 24, 2014  8:33 AM       Thyroid function is still not normal.      Would increase synthroid to 75 mcg per day      K is still a little high      Would repeat on Friday with BMET and also check cortisol level. ------

## 2014-03-30 NOTE — Telephone Encounter (Signed)
Informed patient's wife, pt will be in today for bmet and cortisol. Reviewed dietary intake of potassium. Education sheet left at front desk for high and low potassium foods.

## 2014-03-31 LAB — CORTISOL: Cortisol, Plasma: 6.4 ug/dL

## 2014-04-27 ENCOUNTER — Other Ambulatory Visit: Payer: Self-pay

## 2014-04-27 ENCOUNTER — Other Ambulatory Visit (INDEPENDENT_AMBULATORY_CARE_PROVIDER_SITE_OTHER): Payer: Commercial Managed Care - HMO

## 2014-04-27 ENCOUNTER — Other Ambulatory Visit: Payer: Self-pay | Admitting: Family Medicine

## 2014-04-27 DIAGNOSIS — E039 Hypothyroidism, unspecified: Secondary | ICD-10-CM

## 2014-04-27 DIAGNOSIS — E875 Hyperkalemia: Secondary | ICD-10-CM

## 2014-04-27 LAB — BASIC METABOLIC PANEL
BUN: 25 mg/dL — ABNORMAL HIGH (ref 6–23)
CO2: 30 mEq/L (ref 19–32)
Calcium: 9.5 mg/dL (ref 8.4–10.5)
Chloride: 102 mEq/L (ref 96–112)
Creatinine, Ser: 1.4 mg/dL (ref 0.4–1.5)
GFR: 57.85 mL/min — ABNORMAL LOW (ref >60.00)
Glucose, Bld: 84 mg/dL (ref 70–99)
Potassium: 5.5 mEq/L — ABNORMAL HIGH (ref 3.5–5.1)
Sodium: 139 mEq/L (ref 135–145)

## 2014-04-27 LAB — TSH: TSH: 86.59 u[IU]/mL — ABNORMAL HIGH (ref 0.35–4.50)

## 2014-04-28 ENCOUNTER — Other Ambulatory Visit: Payer: Self-pay | Admitting: Family Medicine

## 2014-04-28 ENCOUNTER — Other Ambulatory Visit: Payer: Self-pay

## 2014-04-28 DIAGNOSIS — E039 Hypothyroidism, unspecified: Secondary | ICD-10-CM

## 2014-04-28 MED ORDER — LEVOTHYROXINE SODIUM 88 MCG PO TABS: 88.0000 ug | ORAL_TABLET | Freq: Every day | ORAL | Status: DC

## 2014-05-07 ENCOUNTER — Other Ambulatory Visit: Payer: Self-pay

## 2014-06-08 ENCOUNTER — Encounter: Payer: Self-pay | Admitting: Family Medicine

## 2014-06-08 ENCOUNTER — Ambulatory Visit (INDEPENDENT_AMBULATORY_CARE_PROVIDER_SITE_OTHER): Payer: Commercial Managed Care - HMO | Admitting: Family Medicine

## 2014-06-08 VITALS — BP 130/80 | HR 72 | Temp 98.3°F | Wt 193.0 lb

## 2014-06-08 DIAGNOSIS — Z23 Encounter for immunization: Secondary | ICD-10-CM

## 2014-06-08 DIAGNOSIS — E039 Hypothyroidism, unspecified: Secondary | ICD-10-CM

## 2014-06-08 LAB — TSH: TSH: 60.69 u[IU]/mL — ABNORMAL HIGH (ref 0.35–4.50)

## 2014-06-08 NOTE — Progress Notes (Signed)
Subjective:    Patient ID: Bradley [name], male    DOB: 09-23-1955, 58 y.o.   MRN: [id number]  HPI Patient here for medical follow-up. History of aortic valve replacement and a lengthy hospitalization last year with Staphylococcus aureus bacteremia with sepsis and endocarditis. He's done extremely well recently. Generally feels well overall. He has hypothyroidism and TSH was significantly elevated at 86 back in August. He is currently taking levothyroxine 88 g daily. He is not taking any PPIs or other things that would likely impair absorption. He states he is compliant with therapy.  Other than some bilateral ear fullness intermittently no complaints.  Past Medical History  Diagnosis Date  . Anxiety   . Depression   . Hypertension   . Hypothyroidism, postsurgical   . Aortic stenosis     s/p Bentall with bioprosthetic AVR 02/2010; Last echo (9/11): Moderate LVH, EF 45-50%, AVR functioning appropriately, aortic valve mean gradient 21, diastolic dysfunction. Chest MRA (2/13): Mild to moderate dilatation at the sinus of Valsalva at 4.1 cm, mild dilatation ascending aorta distal to the tube graft at 3.9 cm, moderate dilatation of the innominate artery a 2.1 cm;    . Hx of cardiac cath     a. LHC in 02/2010: normal cors  . Hx of echocardiogram 2014    Echo (10/14): Severe LVH, EF 60-65%, normal wall motion, grade 1 diastolic dysfunction, AVR functioning normally, mild aortic stenosis (mean 19), moderately dilated aorta, mild LAE, mild RVE  . Staphylococcus aureus bacteremia   . Prosthetic valve endocarditis   . Thyroid cancer   . Heart murmur   . ESRD on dialysis     "[place]; TTS" (10/22/2013)  . Hx of echocardiogram 02/2014    Echo (8/15):  Mod LV, EF 35-40%, Gr 1 DD, AVR ok (mean 14 mmHg), mild LAE   Past Surgical History  Procedure Laterality Date  . Cardiac catheterization  03/02/2010    NORMAL CORONARY ARTERY  . Sternotomy      REDO  . Transthoracic echocardiogram  03/2010   SHOWED MILD REDUCTION OF LV FUNCTION  . Thyroidectomy  ~ 2005  . Shoulder arthroscopy w/ rotator cuff repair Right 2012  . Av fistula placement Left 10/08/2013    Procedure: ARTERIOVENOUS (AV) FISTULA CREATION- LEFT ARM; Radial Cephalic ;  Surgeon: Mal [name], MD;  Location: [facility];  Service: Vascular;  Laterality: Left;  . Aortic valve replacement  2011  . Aortic valve repair  1968  . Tee without cardioversion N/A 10/24/2013    Procedure: TRANSESOPHAGEAL ECHOCARDIOGRAM (TEE);  Surgeon: Dorothy Spark, MD;  Location: [facility] ENDOSCOPY;  Service: Cardiovascular;  Laterality: N/A;  Deneen Harts procedure N/A 10/28/2013    Procedure: REDO BENTALL PROCEDURE, debridment of aoritc root abscess, replacement of aortic root, ascending aorta and aortic valve with homograft. Insertion of left femoral arterial line;  Surgeon: [name], MD;  Location: [facility];  Service: Open Heart Surgery;  Laterality: N/A;  . Intraoperative transesophageal echocardiogram N/A 10/28/2013    Procedure: INTRAOPERATIVE TRANSESOPHAGEAL ECHOCARDIOGRAM;  Surgeon: [name], MD;  Location: [facility];  Service: Open Heart Surgery;  Laterality: N/A;  . Cardiac valve replacement      reports that he has never smoked. He has never used smokeless tobacco. He reports that he does not drink alcohol or use illicit drugs. family history includes Heart disease in his father; Kidney Stones in his sister; Thyroid cancer in his sister and sister. Allergies  Allergen Reactions  .  Oxycodone     Gives patient nightmares  . Rifampin Nausea Only      Review of Systems  Constitutional: Negative for fever, chills, appetite change, fatigue and unexpected weight change.  Respiratory: Negative for cough and shortness of breath.   Cardiovascular: Negative for chest pain and palpitations.       Objective:   Physical Exam  Constitutional: He appears well-developed and well-nourished. No distress.  HENT:  Right Ear: External ear normal.    Left Ear: External ear normal.  Cardiovascular: Normal rate and regular rhythm.   Murmur heard. Pulmonary/Chest: Effort normal and breath sounds normal. No respiratory distress. He has no wheezes. He has no rales.          Assessment & Plan:  Hypothyroidism. Recently under replaced. Recheck TSH today and adjust accordingly. Routine follow-up 6 months. We cannot confirm prior pneumonia vaccine so this is given today with his prior history of aortic valve replacement. Continue yearly flu vaccine

## 2014-06-11 ENCOUNTER — Other Ambulatory Visit: Payer: Self-pay

## 2014-06-11 ENCOUNTER — Other Ambulatory Visit: Payer: Self-pay | Admitting: Family Medicine

## 2014-06-11 DIAGNOSIS — E039 Hypothyroidism, unspecified: Secondary | ICD-10-CM

## 2014-06-11 MED ORDER — LEVOTHYROXINE SODIUM 100 MCG PO TABS: 100.0000 ug | ORAL_TABLET | Freq: Every day | ORAL | Status: DC

## 2014-06-15 ENCOUNTER — Encounter: Payer: Self-pay | Admitting: Vascular Surgery

## 2014-06-16 ENCOUNTER — Ambulatory Visit (INDEPENDENT_AMBULATORY_CARE_PROVIDER_SITE_OTHER): Payer: Commercial Managed Care - HMO | Admitting: Vascular Surgery

## 2014-06-16 ENCOUNTER — Ambulatory Visit (HOSPITAL_COMMUNITY)
Admission: RE | Admit: 2014-06-16 | Discharge: 2014-06-16 | Disposition: A | Discharge status: Home / Self Care | Payer: Commercial Managed Care - HMO | Source: Ambulatory Visit | Attending: Vascular Surgery | Admitting: Vascular Surgery

## 2014-06-16 ENCOUNTER — Encounter: Payer: Self-pay | Admitting: Vascular Surgery

## 2014-06-16 VITALS — BP 125/73 | HR 62 | Resp 18 | Ht 73.0 in | Wt 198.0 lb

## 2014-06-16 DIAGNOSIS — N182 Chronic kidney disease, stage 2 (mild): Secondary | ICD-10-CM

## 2014-06-16 DIAGNOSIS — N181 Chronic kidney disease, stage 1: Secondary | ICD-10-CM

## 2014-06-16 DIAGNOSIS — Z4931 Encounter for adequacy testing for hemodialysis: Secondary | ICD-10-CM | POA: Insufficient documentation

## 2014-06-16 NOTE — Progress Notes (Signed)
Subjective:     Patient ID: Bradley [name], male   DOB: [birth year], 58 y.o.   MRN: [id number]  HPI This 58 year old male returns today for continued follow-up regarding his left radial-cephalic AV fistula which I created in March 2015. He suffered renal failure following cardiac surgery. Subsequently his renal function recovered and he has not required hemodialysis for the last several months. Creatinine is currently 1.4. He is experiencing some numbness in the left thumb and the lateral aspect of the index finger. He does not have pain or numbness in the rest of the hand.  Past Medical History  Diagnosis Date  . Anxiety   . Depression   . Hypertension   . Hypothyroidism, postsurgical   . Aortic stenosis     s/p Bentall with bioprosthetic AVR 02/2010; Last echo (9/11): Moderate LVH, EF 45-50%, AVR functioning appropriately, aortic valve mean gradient 21, diastolic dysfunction. Chest MRA (2/13): Mild to moderate dilatation at the sinus of Valsalva at 4.1 cm, mild dilatation ascending aorta distal to the tube graft at 3.9 cm, moderate dilatation of the innominate artery a 2.1 cm;    . Hx of cardiac cath     a. LHC in 02/2010: normal cors  . Hx of echocardiogram 2014    Echo (10/14): Severe LVH, EF 60-65%, normal wall motion, grade 1 diastolic dysfunction, AVR functioning normally, mild aortic stenosis (mean 19), moderately dilated aorta, mild LAE, mild RVE  . Staphylococcus aureus bacteremia   . Prosthetic valve endocarditis   . Thyroid cancer   . Heart murmur   . ESRD on dialysis     "[place]; TTS" (10/22/2013)  . Hx of echocardiogram 02/2014    Echo (8/15):  Mod LV, EF 35-40%, Gr 1 DD, AVR ok (mean 14 mmHg), mild LAE    History  Substance Use Topics  . Smoking status: Never Smoker   . Smokeless tobacco: Never Used  . Alcohol Use: No     Comment: Occasional    Family History  Problem Relation Age of Onset  . Heart disease Father   . Thyroid cancer Sister   . Thyroid cancer  Sister   . Kidney Stones Sister     Allergies  Allergen Reactions  . Oxycodone     Gives patient nightmares  . Rifampin Nausea Only    Current outpatient prescriptions: aspirin EC 81 MG tablet, Take 81 mg by mouth daily. , Disp: , Rfl: ;  clonazePAM (KLONOPIN) 1 MG tablet, TAKE ONE TABLET BY MOUTH TWICE DAILY AS NEEDED FOR ANXIETY, Disp: 15 tablet, Rfl: 0;  ferrous Q000111Q C-folic acid (TRINSICON / FOLTRIN) capsule, Take 1 capsule by mouth daily., Disp: 30 capsule, Rfl: 1 furosemide (LASIX) 20 MG tablet, Take 1 tablet (20 mg total) by mouth daily., Disp: 30 tablet, Rfl: 11;  levothyroxine (SYNTHROID, LEVOTHROID) 100 MCG tablet, Take 1 tablet (100 mcg total) by mouth daily before breakfast., Disp: 30 tablet, Rfl: 5;  metoprolol tartrate (LOPRESSOR) 25 MG tablet, TAKE ONE-HALF TABLET BY MOUTH TWICE DAILY, Disp: 30 tablet, Rfl: 5 midodrine (PROAMATINE) 5 MG tablet, Take 5 mg by mouth 2 (two) times daily with a meal., Disp: , Rfl: ;  ondansetron (ZOFRAN-ODT) 4 MG disintegrating tablet, Take 1 tablet (4 mg total) by mouth every 8 (eight) hours as needed for nausea or vomiting., Disp: 20 tablet, Rfl: 0;  sertraline (ZOLOFT) 50 MG tablet, TAKE ONE TABLET BY MOUTH ONCE DAILY, Disp: 30 tablet, Rfl: 5  BP 125/73 mmHg  Pulse 62  Resp  18  Ht 6\' 1"  (1.854 m)  Wt 198 lb (89.812 kg)  BMI 26.13 kg/m2  Body mass index is 26.13 kg/(m^2).          Review of SystemsDenies chest pain, dyspnea on exertion, PND, orthopnea. Does have occasional dizziness     Objective:   Physical Exam BP 125/73 mmHg  Pulse 62  Resp 18  Ht 6\' 1"  (1.854 m)  Wt 198 lb (89.812 kg)  BMI 26.13 kg/m2  Gen. Well-developed well-nourished male no apparent stress alert and oriented 3 Left upper extremity with 2+ radial pulse and very slight thrill palpable. Left hand well-perfused.  Today I ordered a duplex scan of the dialysis fistula which reveals very sclerotic cephalic vein with one area of total  occlusion of the vein and some recanalization with hyperplasia     Assessment:     Patient with history of renal failure with now good recovery of renal function Left radial cephalic AV fistula will not be satisfactory for vascular access-too much sclerosis in the cephalic vein No evidence of steal syndrome     Plan:     Will not plan any further attempts at vascular access since patient does not require hemodialysis He will return to see [place] on when necessary basis No need to ligate fistula it should occluded on its own in the next several months

## 2014-07-05 NOTE — Progress Notes (Signed)
HPIHerbert F [name] is a 58 y.o. male with a history of aortic stenosis requiring aortic valvulotomy at age 5 and subsequent Bentall procedure in 02/2000 with placement of a pericardial tissue valve. Cardiac catheterization 02/2010 demonstrated normal coronary arteries. Other history includes LBBB, thyroid cancer, status post thyroidectomy in 2005, surgical hypothyroidism, HTN, depression, chronically elevated troponins.  He was admitted in 08/2013 with MSSA bacteremia and bacterial endocarditis with evidence of cerebral emboli. He had worsening renal function (possibly from gentamicin) and was started on hemodialysis. 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 He was found to have a perivalvular abscess on TEE and cardiac CTA and ultimately underwent redo Bentall procedure with homograft and debridement of root abscess. He required amiodarone in the OR for VT/VF (required multiple shocks). He continued to require hemodialysis. He had a prolonged hospitalization and was d/c 11/13/13.  Admitted [id number] with a/c systolic CHF. Echo demonstrated worsening LVF with EF 20-25%. ACEI and beta blocker were started. Cardiac CTA prior to his redo Bentall demonstrated normal cors. Of note, TSH was over 50 and his synthroid was adjusted.  I saw the patient at the end of the summer SInce then he has done fairly well  Denies SOB  Does feel a little weak, dizzy at times  No CP  Thinks he may not be drinking enough.  Studies:   - LHC (02/2010): Normal cors  - TEE (09/2013): EF 45-50%  - Echo (12/08/13): Mild LVH, EF 20-25%, anteroseptal HK, AVR ok, mild MR, mild LAE, mild RVE, mild reduced RVSF, mild RAE, mild to mod TR, PASP 31 mmHg.  - Carotid US (09/2013): Bilateral ICA 1-39%  Cardiac CTA (10/26/13:  IMPRESSION: 1. There is a large paravalvular abscess around the root of the prosthetic aortic valve measuring 58 x 45 x 21 mm located antero-bilaterally (sparing posterior portion of the  aortic root) withdynamic obstruction of RCA in systole. 2. Dilated proximal portion of the aortic arch (post graft) measuring 41 x 40 mm. 3. Normal coronaries. Right dominance.     Allergies  Allergen Reactions  . Oxycodone     Gives patient nightmares  . Rifampin Nausea Only    Current Outpatient Prescriptions  Medication Sig Dispense Refill  . aspirin EC 81 MG tablet Take 81 mg by mouth daily.     . clonazePAM (KLONOPIN) 1 MG tablet TAKE ONE TABLET BY MOUTH TWICE DAILY AS NEEDED FOR ANXIETY 15 tablet 0  . furosemide (LASIX) 20 MG tablet Take 1 tablet (20 mg total) by mouth daily. 30 tablet 11  . levothyroxine (SYNTHROID, LEVOTHROID) 100 MCG tablet Take 1 tablet (100 mcg total) by mouth daily before breakfast. 30 tablet 5  . metoprolol tartrate (LOPRESSOR) 25 MG tablet TAKE ONE-HALF TABLET BY MOUTH TWICE DAILY 30 tablet 5  . Multiple Vitamins-Minerals (CENTRUM SILVER ADULT 50+) TABS Take 1 tablet by mouth daily.    . sertraline (ZOLOFT) 50 MG tablet TAKE ONE TABLET BY MOUTH ONCE DAILY 30 tablet 5   No current facility-administered medications for this visit.    Past Medical History  Diagnosis Date  . Anxiety   . Depression   . Hypertension   . Hypothyroidism, postsurgical   . Aortic stenosis     s/p Bentall with bioprosthetic AVR 02/2010; Last echo (9/11): Moderate LVH, EF 45-50%, AVR functioning appropriately, aortic valve mean gradient 21, diastolic dysfunction. Chest MRA (2/13): Mild to moderate dilatation at the sinus of Valsalva at 4.1 cm, mild dilatation ascending aorta distal  to the tube graft at 3.9 cm, moderate dilatation of the innominate artery a 2.1 cm;    . Hx of cardiac cath     a. LHC in 02/2010: normal cors  . Hx of echocardiogram 2014    Echo (10/14): Severe LVH, EF 60-65%, normal wall motion, grade 1 diastolic dysfunction, AVR functioning normally, mild aortic stenosis (mean 19), moderately dilated aorta, mild LAE, mild RVE  . Staphylococcus aureus bacteremia    . Prosthetic valve endocarditis   . Thyroid cancer   . Heart murmur   . ESRD on dialysis     "[place]; TTS" (10/22/2013)  . Hx of echocardiogram 02/2014    Echo (8/15):  Mod LV, EF 35-40%, Gr 1 DD, AVR ok (mean 14 mmHg), mild LAE    Past Surgical History  Procedure Laterality Date  . Cardiac catheterization  03/02/2010    NORMAL CORONARY ARTERY  . Sternotomy      REDO  . Transthoracic echocardiogram  03/2010    SHOWED MILD REDUCTION OF LV FUNCTION  . Thyroidectomy  ~ 2005  . Shoulder arthroscopy w/ rotator cuff repair Right 2012  . Av fistula placement Left 10/08/2013    Procedure: ARTERIOVENOUS (AV) FISTULA CREATION- LEFT ARM; Radial Cephalic ;  Surgeon: Mal [name], MD;  Location: [facility];  Service: Vascular;  Laterality: Left;  . Aortic valve replacement  2011  . Aortic valve repair  1968  . Tee without cardioversion N/A 10/24/2013    Procedure: TRANSESOPHAGEAL ECHOCARDIOGRAM (TEE);  Surgeon: Dorothy Spark, MD;  Location: [facility] ENDOSCOPY;  Service: Cardiovascular;  Laterality: N/A;  Deneen Harts procedure N/A 10/28/2013    Procedure: REDO BENTALL PROCEDURE, debridment of aoritc root abscess, replacement of aortic root, ascending aorta and aortic valve with homograft. Insertion of left femoral arterial line;  Surgeon: [name], MD;  Location: [facility];  Service: Open Heart Surgery;  Laterality: N/A;  . Intraoperative transesophageal echocardiogram N/A 10/28/2013    Procedure: INTRAOPERATIVE TRANSESOPHAGEAL ECHOCARDIOGRAM;  Surgeon: [name], MD;  Location: [facility];  Service: Open Heart Surgery;  Laterality: N/A;  . Cardiac valve replacement      Family History  Problem Relation Age of Onset  . Heart disease Father   . Thyroid cancer Sister   . Thyroid cancer Sister   . Kidney Stones Sister     History   Social History  . Marital Status: Married    Spouse Name: N/A    Number of Children: N/A  . Years of Education: N/A   Occupational History  . Not on  file.   Social History Main Topics  . Smoking status: Never Smoker   . Smokeless tobacco: Never Used  . Alcohol Use: No     Comment: Occasional  . Drug Use: No  . Sexual Activity: No   Other Topics Concern  . Not on file   Social History Narrative   Lives at home with walker.  Does not drive.  ESRD T, H, Sa.  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  They are negative to the above problem except as previously stated.  Vital Signs: BP 115/68 mmHg  Pulse 64  Ht 6' (1.829 m)  Wt 196 lb 3.2 oz (88.996 kg)  BMI 26.60 kg/m2  Physical Exam  HEENT:  Normocephalic, atraumatic. EOMI, PERRLA.  Neck: JVP is normal.  No bruits.  Lungs: clear to auscultation. No rales no wheezes.  Heart: Regular rate  and rhythm. Normal S1, S2. No S3.   II/VI systolic murmur at base   PMI not displaced.  Abdomen:  Supple, nontender. Normal bowel sounds. No masses. No hepatomegaly.Mild erythema at site of tic bitev   Extremities:   Good distal pulses throughout. No lower extremity edema.  Musculoskeletal :moving all extremities.  Neuro:   alert and oriented x3.  CN II-XII grossly intact.   Assessment and Plan: 1.  Endocarditis  Seen in ID clinic recently  Has finished Rx.  No evid of infection  2.  CHronic systolic CHF  LVEF 35 to 40 %  Volume status looks good  Wil repeat BMET  Will pull back on lasix to 2x per wk  Follow up in 4 to 6 wks  WOuld like to add ace I but K was high in the past and he feels week at times  3.  HTN  FOllow    4.  Thyroid  Dose of synthroid re ently increassed  5.  Tick bite  Local erthyema at site of bite  I would watch  Instructed to call back if worsens or develops fever or HA.

## 2014-07-06 ENCOUNTER — Encounter: Payer: Self-pay | Admitting: Internal Medicine

## 2014-07-06 ENCOUNTER — Ambulatory Visit (INDEPENDENT_AMBULATORY_CARE_PROVIDER_SITE_OTHER): Payer: Commercial Managed Care - HMO | Admitting: Internal Medicine

## 2014-07-06 VITALS — BP 115/68 | HR 64 | Ht 72.0 in | Wt 196.2 lb

## 2014-07-06 DIAGNOSIS — I10 Essential (primary) hypertension: Secondary | ICD-10-CM

## 2014-07-06 DIAGNOSIS — I5042 Chronic combined systolic (congestive) and diastolic (congestive) heart failure: Secondary | ICD-10-CM

## 2014-07-06 MED ORDER — FUROSEMIDE 20 MG PO TABS
ORAL_TABLET | ORAL | Status: DC
Start: 2014-07-06 — End: 2015-02-08

## 2014-07-06 MED ORDER — BETAMETHASONE VALERATE 0.1 % EX OINT: 1.0000 "application " | TOPICAL_OINTMENT | Freq: Two times a day (BID) | CUTANEOUS | Status: DC

## 2014-07-06 NOTE — Patient Instructions (Addendum)
Your physician has recommended you make the following change in your medication:  1.) decrease lasix to twice weekly--Monday/Thursday 2.) betamethasone cream topically twice daily to affected areas Your physician recommends that you return for lab work today. (BMET) Your physician recommends that you schedule a follow-up appointment in: 4-6 weeks with Dr. Harrington Challenger.

## 2014-07-09 ENCOUNTER — Other Ambulatory Visit (INDEPENDENT_AMBULATORY_CARE_PROVIDER_SITE_OTHER): Payer: Medicare HMO

## 2014-07-09 DIAGNOSIS — I5022 Chronic systolic (congestive) heart failure: Secondary | ICD-10-CM

## 2014-07-09 LAB — BASIC METABOLIC PANEL
BUN: 24 mg/dL — ABNORMAL HIGH (ref 6–23)
CO2: 29 mEq/L (ref 19–32)
Calcium: 9.4 mg/dL (ref 8.4–10.5)
Chloride: 103 mEq/L (ref 96–112)
Creatinine, Ser: 1.4 mg/dL (ref 0.4–1.5)
GFR: 53.66 mL/min — ABNORMAL LOW (ref >60.00)
Glucose, Bld: 88 mg/dL (ref 70–99)
Potassium: 4.8 mEq/L (ref 3.5–5.1)
Sodium: 137 mEq/L (ref 135–145)

## 2014-07-15 ENCOUNTER — Telehealth: Payer: Self-pay

## 2014-07-15 NOTE — Telephone Encounter (Signed)
Pt walked into office, complaining of high readings when checking blood pressure. Patient informed no openings in the office. Check patient B/P 134/80 waited 10 minutes later 130/80. Dr. B informed. Pt sent back home.

## 2014-08-23 NOTE — Progress Notes (Signed)
HPIHerbert F [name] is a 59 y.o. male with a history of aortic stenosis requiring aortic valvulotomy at age 60 and subsequent Bentall procedure in 02/2000 with placement of a pericardial tissue valve. Cardiac catheterization 02/2010 demonstrated normal coronary arteries. Other history includes LBBB, thyroid cancer, status post thyroidectomy in 2005, surgical hypothyroidism, HTN, depression, chronically elevated troponins.  He was admitted in 08/2013 with MSSA bacteremia and bacterial endocarditis with evidence of cerebral emboli. He had worsening renal function (possibly from gentamicin) and was started on hemodialysis. 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 He was found to have a perivalvular abscess on TEE and cardiac CTA and ultimately underwent redo Bentall procedure with homograft and debridement of root abscess. He required amiodarone in the OR for VT/VF (required multiple shocks). He continued to require hemodialysis. He had a prolonged hospitalization and was d/c 11/13/13.  Admitted [id number] with a/c systolic CHF. Echo demonstrated worsening LVF with EF 20-25%. ACEI and beta blocker were started. Cardiac CTA prior to his redo Bentall demonstrated normal cors. Of note, TSH was over 50 and his synthroid was adjusted.  I saw the patient  In December He came in once since then with BP check  130s/  He says he is feeling good  Breathing is OK  No signif dizziness  No CP Walking    Studies:   - LHC (02/2010): Normal cors  - TEE (09/2013): EF 45-50%  - Echo (12/08/13): Mild LVH, EF 20-25%, anteroseptal HK, AVR ok, mild MR, mild LAE, mild RVE, mild reduced RVSF, mild RAE, mild to mod TR, PASP 31 mmHg.  - Carotid US (09/2013): Bilateral ICA 1-39%  Cardiac CTA (10/26/13:  IMPRESSION: 1. There is a large paravalvular abscess around the root of the prosthetic aortic valve measuring 58 x 45 x 21 mm located antero-bilaterally (sparing posterior portion of the aortic root)  withdynamic obstruction of RCA in systole. 2. Dilated proximal portion of the aortic arch (post graft) measuring 41 x 40 mm. 3. Normal coronaries. Right dominance.     Allergies  Allergen Reactions  . Oxycodone     Gives patient nightmares  . Rifampin Nausea Only    Current Outpatient Prescriptions  Medication Sig Dispense Refill  . aspirin EC 81 MG tablet Take 81 mg by mouth daily.     . betamethasone valerate ointment (VALISONE) 0.1 % Apply 1 application topically 2 (two) times daily. 30 g 0  . clonazePAM (KLONOPIN) 1 MG tablet TAKE ONE TABLET BY MOUTH TWICE DAILY AS NEEDED FOR ANXIETY 15 tablet 0  . furosemide (LASIX) 20 MG tablet Take 1 tablet every Monday and Thursday 30 tablet 11  . levothyroxine (SYNTHROID, LEVOTHROID) 100 MCG tablet Take 1 tablet (100 mcg total) by mouth daily before breakfast. 30 tablet 5  . metoprolol tartrate (LOPRESSOR) 25 MG tablet TAKE ONE-HALF TABLET BY MOUTH TWICE DAILY 30 tablet 5  . Multiple Vitamins-Minerals (CENTRUM SILVER ADULT 50+) TABS Take 1 tablet by mouth daily.    . sertraline (ZOLOFT) 50 MG tablet TAKE ONE TABLET BY MOUTH ONCE DAILY 30 tablet 5   No current facility-administered medications for this visit.    Past Medical History  Diagnosis Date  . Anxiety   . Depression   . Hypertension   . Hypothyroidism, postsurgical   . Aortic stenosis     s/p Bentall with bioprosthetic AVR 02/2010; Last echo (9/11): Moderate LVH, EF 45-50%, AVR functioning appropriately, aortic valve mean gradient 21, diastolic dysfunction. Chest MRA (2/13): Mild  to moderate dilatation at the sinus of Valsalva at 4.1 cm, mild dilatation ascending aorta distal to the tube graft at 3.9 cm, moderate dilatation of the innominate artery a 2.1 cm;    . Hx of cardiac cath     a. LHC in 02/2010: normal cors  . Hx of echocardiogram 2014    Echo (10/14): Severe LVH, EF 60-65%, normal wall motion, grade 1 diastolic dysfunction, AVR functioning normally, mild aortic stenosis  (mean 19), moderately dilated aorta, mild LAE, mild RVE  . Staphylococcus aureus bacteremia   . Prosthetic valve endocarditis   . Thyroid cancer   . Heart murmur   . ESRD on dialysis     "[place]; TTS" (10/22/2013)  . Hx of echocardiogram 02/2014    Echo (8/15):  Mod LV, EF 35-40%, Gr 1 DD, AVR ok (mean 14 mmHg), mild LAE    Past Surgical History  Procedure Laterality Date  . Cardiac catheterization  03/02/2010    NORMAL CORONARY ARTERY  . Sternotomy      REDO  . Transthoracic echocardiogram  03/2010    SHOWED MILD REDUCTION OF LV FUNCTION  . Thyroidectomy  ~ 2005  . Shoulder arthroscopy w/ rotator cuff repair Right 2012  . Av fistula placement Left 10/08/2013    Procedure: ARTERIOVENOUS (AV) FISTULA CREATION- LEFT ARM; Radial Cephalic ;  Surgeon: Mal [name], MD;  Location: [place];  Service: Vascular;  Laterality: Left;  . Aortic valve replacement  2011  . Aortic valve repair  1968  . Tee without cardioversion N/A 10/24/2013    Procedure: TRANSESOPHAGEAL ECHOCARDIOGRAM (TEE);  Surgeon: Dorothy Spark, MD;  Location: [facility] ENDOSCOPY;  Service: Cardiovascular;  Laterality: N/A;  Deneen Harts procedure N/A 10/28/2013    Procedure: REDO BENTALL PROCEDURE, debridment of aoritc root abscess, replacement of aortic root, ascending aorta and aortic valve with homograft. Insertion of left femoral arterial line;  Surgeon: [name], MD;  Location: [place];  Service: Open Heart Surgery;  Laterality: N/A;  . Intraoperative transesophageal echocardiogram N/A 10/28/2013    Procedure: INTRAOPERATIVE TRANSESOPHAGEAL ECHOCARDIOGRAM;  Surgeon: [name], MD;  Location: [place];  Service: Open Heart Surgery;  Laterality: N/A;  . Cardiac valve replacement      Family History  Problem Relation Age of Onset  . Heart disease Father   . Thyroid cancer Sister   . Thyroid cancer Sister   . Kidney Stones Sister     History   Social History  . Marital Status: Married    Spouse Name: N/A     Number of Children: N/A  . Years of Education: N/A   Occupational History  . Not on file.   Social History Main Topics  . Smoking status: Never Smoker   . Smokeless tobacco: Never Used  . Alcohol Use: No     Comment: Occasional  . Drug Use: No  . Sexual Activity: No   Other Topics Concern  . Not on file   Social History Narrative   Lives at home with walker.  Does not drive.  ESRD T, H, Sa.  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  They are negative to the above problem except as previously stated.  Vital Signs: BP 130/80 mmHg  Pulse 90  Ht 6\' 1"  (1.854 m)  Wt 201 lb (91.173 kg)  BMI 26.52 kg/m2  Physical Exam  HEENT:  Normocephalic, atraumatic. EOMI, PERRLA.  Neck: JVP is normal.  No bruits.  Lungs: clear to auscultation. No rales no wheezes.  Heart: Regular rate and rhythm. Normal S1, S2. No S3.   II/VI systolic murmur at base   PMI not displaced.  Abdomen:  Supple, nontender. Normal bowel sounds. No masses. No hepatomegaly.Mild erythema at site of tic bitev   Extremities:   Good distal pulses throughout. No lower extremity edema.  Musculoskeletal :moving all extremities.  Neuro:   alert and oriented x3.  CN II-XII grossly intact.   Assessment and Plan: 1.  Endocarditis  Seen in ID clinic recently  Has finished Rx.  No evid of infection  2.  CHronic systolic CHF  LVEF 35 to 40 %  Volume looks good  With BP I would add low dose lisinopril 2.5 mg and follow  Check BMET in 1 wk  His K hs been up a litle in past.   3.  HTN  FOllow  With addition of Lisinopril  Wife will call in    4.  Thyroid On synthroid  Follow in medicine clinic

## 2014-08-24 ENCOUNTER — Ambulatory Visit (INDEPENDENT_AMBULATORY_CARE_PROVIDER_SITE_OTHER): Payer: Commercial Managed Care - HMO | Admitting: Internal Medicine

## 2014-08-24 ENCOUNTER — Encounter: Payer: Self-pay | Admitting: Internal Medicine

## 2014-08-24 VITALS — BP 130/80 | HR 90 | Ht 73.0 in | Wt 201.0 lb

## 2014-08-24 DIAGNOSIS — I10 Essential (primary) hypertension: Secondary | ICD-10-CM | POA: Diagnosis not present

## 2014-08-24 DIAGNOSIS — I5022 Chronic systolic (congestive) heart failure: Secondary | ICD-10-CM

## 2014-08-24 NOTE — Patient Instructions (Signed)
Your physician recommends that you continue on your current medications as directed. Please refer to the Current Medication list given to you today. Your physician recommends that you return for lab work in: [place] P[facility] [place]. Your physician wants you to follow-up in: [place].  You will receive a reminder letter in the mail two months in advance. If you don't receive a letter, please call our office to schedule the follow-up appointment.

## 2014-09-03 ENCOUNTER — Other Ambulatory Visit (INDEPENDENT_AMBULATORY_CARE_PROVIDER_SITE_OTHER): Payer: Commercial Managed Care - HMO

## 2014-09-03 ENCOUNTER — Other Ambulatory Visit: Payer: Self-pay | Admitting: Family Medicine

## 2014-09-03 DIAGNOSIS — E039 Hypothyroidism, unspecified: Secondary | ICD-10-CM

## 2014-09-03 DIAGNOSIS — I10 Essential (primary) hypertension: Secondary | ICD-10-CM | POA: Diagnosis not present

## 2014-09-03 DIAGNOSIS — N179 Acute kidney failure, unspecified: Secondary | ICD-10-CM

## 2014-09-03 LAB — T4, FREE: Free T4: 0.67 ng/dL (ref 0.60–1.60)

## 2014-09-03 LAB — BASIC METABOLIC PANEL
BUN: 29 mg/dL — ABNORMAL HIGH (ref 6–23)
CO2: 28 mEq/L (ref 19–32)
Calcium: 9.6 mg/dL (ref 8.4–10.5)
Chloride: 106 mEq/L (ref 96–112)
Creatinine, Ser: 1.26 mg/dL (ref 0.40–1.50)
GFR: 62.57 mL/min (ref >60.00)
Glucose, Bld: 78 mg/dL (ref 70–99)
Potassium: 5.3 mEq/L — ABNORMAL HIGH (ref 3.5–5.1)
Sodium: 139 mEq/L (ref 135–145)

## 2014-09-03 LAB — TSH: TSH: 44.83 u[IU]/mL — ABNORMAL HIGH (ref 0.35–4.50)

## 2014-09-04 ENCOUNTER — Other Ambulatory Visit: Payer: Self-pay | Admitting: Family Medicine

## 2014-09-04 ENCOUNTER — Other Ambulatory Visit: Payer: Self-pay

## 2014-09-04 DIAGNOSIS — E039 Hypothyroidism, unspecified: Secondary | ICD-10-CM

## 2014-09-04 MED ORDER — LEVOTHYROXINE SODIUM 112 MCG PO TABS: 112.0000 ug | ORAL_TABLET | Freq: Every day | ORAL | Status: DC

## 2014-09-17 ENCOUNTER — Telehealth: Payer: Self-pay | Admitting: Internal Medicine

## 2014-09-17 DIAGNOSIS — R899 Unspecified abnormal finding in specimens from other organs, systems and tissues: Secondary | ICD-10-CM

## 2014-09-17 NOTE — Telephone Encounter (Signed)
Wife calling for bp results: 1-25 128/81 , 1-27 119/81, 1-28 120/79,  1-29 127/81, 1-30 109/73, 1-31 119/73, 2-2 129/84. 2-3 128/82, 2-4 114/79,  2-5 129/84,  2-6 117/81,  2-7 119/83, 2-11 127/80, 2-12 136/92, 2-14 117/82, 2-16 128/79, 2-17 125/83, and 2-18 121/84 -- [phone]  Also wants lab results

## 2014-09-18 NOTE — Telephone Encounter (Signed)
Left message for patient or his wife to call back. Sent staff message to Dr. Harrington Challenger to review BP readings and BMET which resulted to Dr. Elease Hashimoto.

## 2014-09-23 NOTE — Telephone Encounter (Signed)
Fay Records, MD  Rodman Key, RN           I would repeat BMET at his convenience   BP overall looks pretty good.      Informed patient's wife. He will have BMEt next week at [facility]

## 2014-09-28 ENCOUNTER — Other Ambulatory Visit (INDEPENDENT_AMBULATORY_CARE_PROVIDER_SITE_OTHER): Payer: Commercial Managed Care - HMO

## 2014-09-28 DIAGNOSIS — E039 Hypothyroidism, unspecified: Secondary | ICD-10-CM | POA: Diagnosis not present

## 2014-09-28 DIAGNOSIS — R899 Unspecified abnormal finding in specimens from other organs, systems and tissues: Secondary | ICD-10-CM | POA: Diagnosis not present

## 2014-09-28 LAB — BASIC METABOLIC PANEL
BUN: 33 mg/dL — ABNORMAL HIGH (ref 6–23)
CO2: 29 mEq/L (ref 19–32)
Calcium: 9.9 mg/dL (ref 8.4–10.5)
Chloride: 107 mEq/L (ref 96–112)
Creatinine, Ser: 1.31 mg/dL (ref 0.40–1.50)
GFR: 59.81 mL/min — ABNORMAL LOW (ref >60.00)
Glucose, Bld: 83 mg/dL (ref 70–99)
Potassium: 5.4 mEq/L — ABNORMAL HIGH (ref 3.5–5.1)
Sodium: 139 mEq/L (ref 135–145)

## 2014-09-28 LAB — TSH: TSH: 44.8 u[IU]/mL — ABNORMAL HIGH (ref 0.35–4.50)

## 2014-09-29 ENCOUNTER — Telehealth: Payer: Self-pay | Admitting: Family Medicine

## 2014-09-29 ENCOUNTER — Other Ambulatory Visit: Payer: Self-pay

## 2014-09-29 ENCOUNTER — Other Ambulatory Visit: Payer: Self-pay | Admitting: Family Medicine

## 2014-09-29 DIAGNOSIS — E039 Hypothyroidism, unspecified: Secondary | ICD-10-CM

## 2014-09-29 MED ORDER — LEVOTHYROXINE SODIUM 125 MCG PO TABS: 125.0000 ug | ORAL_TABLET | Freq: Every day | ORAL | Status: DC

## 2014-09-29 NOTE — Telephone Encounter (Signed)
Bradley [name] with Dr Alan Ripper office called to ask that we draw a BMET for elevated KCL. Patient told them it was easier on him to come to our office to have the labs. He would like to come in on Friday. Please copy to Dr Harrington Challenger.

## 2014-09-30 ENCOUNTER — Other Ambulatory Visit: Payer: Self-pay | Admitting: Family Medicine

## 2014-09-30 ENCOUNTER — Telehealth: Payer: Self-pay | Admitting: Internal Medicine

## 2014-09-30 DIAGNOSIS — E875 Hyperkalemia: Secondary | ICD-10-CM

## 2014-09-30 MED ORDER — LISINOPRIL 2.5 MG PO TABS: 2.5000 mg | ORAL_TABLET | Freq: Every day | ORAL | Status: DC

## 2014-09-30 NOTE — Telephone Encounter (Signed)
New Msg        Pt wife calling today with concerns about pt potassium.   Does pt need to continue Lisinopril?  Instructions on potassium?  Please call and advise.

## 2014-09-30 NOTE — Telephone Encounter (Signed)
Lab is ordered.  

## 2014-09-30 NOTE — Telephone Encounter (Signed)
Chart reviewed, per Dr Alan Ripper note from 08/23/14 pt was to be on lisinopril 2.5 mg/ I added this to his MAR. Pt is taking med as prescribed.   Per wife, bp running better 130- 120// 75-80.  Wife concerned with K+ levels, k+rich foods reviewed to avoid, pt has lab redraw at PCP this Friday 10/02/14. Pt was told he will be further advised after the lab draw. Pt was told I will forward the msg to dr Harrington Challenger to update her.

## 2014-10-05 ENCOUNTER — Other Ambulatory Visit: Payer: Self-pay | Admitting: Family Medicine

## 2014-10-05 ENCOUNTER — Other Ambulatory Visit (INDEPENDENT_AMBULATORY_CARE_PROVIDER_SITE_OTHER): Payer: Commercial Managed Care - HMO

## 2014-10-05 DIAGNOSIS — E875 Hyperkalemia: Secondary | ICD-10-CM

## 2014-10-05 LAB — BASIC METABOLIC PANEL
BUN: 24 mg/dL — ABNORMAL HIGH (ref 6–23)
CO2: 29 mEq/L (ref 19–32)
Calcium: 9.3 mg/dL (ref 8.4–10.5)
Chloride: 106 mEq/L (ref 96–112)
Creatinine, Ser: 1.27 mg/dL (ref 0.40–1.50)
GFR: 61.98 mL/min (ref >60.00)
Glucose, Bld: 87 mg/dL (ref 70–99)
Potassium: 4.6 mEq/L (ref 3.5–5.1)
Sodium: 139 mEq/L (ref 135–145)

## 2014-10-09 NOTE — Telephone Encounter (Signed)
bmet drawn last week at PCP office. Note on labs that they were forwarded to Dr. Harrington Challenger.

## 2014-10-13 ENCOUNTER — Telehealth: Payer: Self-pay | Admitting: Internal Medicine

## 2014-10-13 NOTE — Telephone Encounter (Signed)
Pts wife calling to obtain K results from labs that were drawn on 10/05/14 at Dr Anastasio Auerbach office, and routed to Dr Harrington Challenger for further review.  Informed the wife that I will need permission from the pt to release any medical information, due to [facility] not on file.  Pt got on the phone and verbally consented for me to speak with his wife and give her his lab info. Informed the wife that the pts potassium level is now WNL at 4.6, which is down from 5.4.  Encouraged the pt and wife to have a DPR signed, next time they are in the office to see Dr Harrington Challenger.  Both the pt and his wife verbalized understanding and agree with this plan.

## 2014-10-13 NOTE — Telephone Encounter (Signed)
New Msg        Pt wife calling, states she is calling about lab results for potassium.    Please return call.

## 2014-10-18 ENCOUNTER — Other Ambulatory Visit: Payer: Self-pay | Admitting: Family Medicine

## 2014-10-19 NOTE — Telephone Encounter (Signed)
Refill Once.  Avoid regular use.

## 2014-10-19 NOTE — Telephone Encounter (Signed)
Last visit 06/08/14 Last refill 12/07/13 #15 0 refill

## 2014-11-02 ENCOUNTER — Other Ambulatory Visit: Payer: Self-pay | Admitting: Family Medicine

## 2014-11-11 ENCOUNTER — Ambulatory Visit (INDEPENDENT_AMBULATORY_CARE_PROVIDER_SITE_OTHER): Payer: Commercial Managed Care - HMO | Admitting: Family Medicine

## 2014-11-11 ENCOUNTER — Encounter: Payer: Self-pay | Admitting: Family Medicine

## 2014-11-11 VITALS — BP 126/68 | HR 64 | Temp 98.1°F | Wt 197.0 lb

## 2014-11-11 DIAGNOSIS — H1132 Conjunctival hemorrhage, left eye: Secondary | ICD-10-CM

## 2014-11-11 DIAGNOSIS — E890 Postprocedural hypothyroidism: Secondary | ICD-10-CM

## 2014-11-11 DIAGNOSIS — R630 Anorexia: Secondary | ICD-10-CM

## 2014-11-11 DIAGNOSIS — L84 Corns and callosities: Secondary | ICD-10-CM | POA: Diagnosis not present

## 2014-11-11 NOTE — Progress Notes (Signed)
Subjective:    Patient ID: Bradley [name], male    DOB: 08/12/1955, 59 y.o.   MRN: [id number]  HPI  patient seen for the following issues   Some loss of appetite few weeks ago. His weight is actually up 4 pounds compared to last visit here. He has not had any abdominal pain or any nausea or vomiting. He did describe recent respiratory symptoms of nasal congestion and postnasal drip and that is improving and his appetite is slowly improving as well. He denies any nausea or vomiting or diarrhea.   He's had some fatigue issues and has hypothyroidism which has been under-replaced. We increased his thyroid medication about a month ago. He'll be due for repeat TSH in early May. He is compliant with therapy.   Small left subconjunctival hemorrhage. He was out in the woods and a branch hit his eye. He's not had any blurred vision whatsoever nor any eye pain.   Large callused area left foot. Painful to ambulation. Treated for plantar wart previously.  Past Medical History  Diagnosis Date  . Anxiety   . Depression   . Hypertension   . Hypothyroidism, postsurgical   . Aortic stenosis     s/p Bentall with bioprosthetic AVR 02/2010; Last echo (9/11): Moderate LVH, EF 45-50%, AVR functioning appropriately, aortic valve mean gradient 21, diastolic dysfunction. Chest MRA (2/13): Mild to moderate dilatation at the sinus of Valsalva at 4.1 cm, mild dilatation ascending aorta distal to the tube graft at 3.9 cm, moderate dilatation of the innominate artery a 2.1 cm;    . Hx of cardiac cath     a. LHC in 02/2010: normal cors  . Hx of echocardiogram 2014    Echo (10/14): Severe LVH, EF 60-65%, normal wall motion, grade 1 diastolic dysfunction, AVR functioning normally, mild aortic stenosis (mean 19), moderately dilated aorta, mild LAE, mild RVE  . Staphylococcus aureus bacteremia   . Prosthetic valve endocarditis   . Thyroid cancer   . Heart murmur   . ESRD on dialysis     "[place]; TTS" (10/22/2013)   . Hx of echocardiogram 02/2014    Echo (8/15):  Mod LV, EF 35-40%, Gr 1 DD, AVR ok (mean 14 mmHg), mild LAE   Past Surgical History  Procedure Laterality Date  . Cardiac catheterization  03/02/2010    NORMAL CORONARY ARTERY  . Sternotomy      REDO  . Transthoracic echocardiogram  03/2010    SHOWED MILD REDUCTION OF LV FUNCTION  . Thyroidectomy  ~ 2005  . Shoulder arthroscopy w/ rotator cuff repair Right 2012  . Av fistula placement Left 10/08/2013    Procedure: ARTERIOVENOUS (AV) FISTULA CREATION- LEFT ARM; Radial Cephalic ;  Surgeon: Mal [name], MD;  Location: [place];  Service: Vascular;  Laterality: Left;  . Aortic valve replacement  2011  . Aortic valve repair  1968  . Tee without cardioversion N/A 10/24/2013    Procedure: TRANSESOPHAGEAL ECHOCARDIOGRAM (TEE);  Surgeon: Dorothy Spark, MD;  Location: [facility] ENDOSCOPY;  Service: Cardiovascular;  Laterality: N/A;  Deneen Harts procedure N/A 10/28/2013    Procedure: REDO BENTALL PROCEDURE, debridment of aoritc root abscess, replacement of aortic root, ascending aorta and aortic valve with homograft. Insertion of left femoral arterial line;  Surgeon: [name], MD;  Location: [place];  Service: Open Heart Surgery;  Laterality: N/A;  . Intraoperative transesophageal echocardiogram N/A 10/28/2013    Procedure: INTRAOPERATIVE TRANSESOPHAGEAL ECHOCARDIOGRAM;  Surgeon: [name], MD;  Location: [facility] OR;  Service: Open Heart Surgery;  Laterality: N/A;  . Cardiac valve replacement      reports that he has never smoked. He has never used smokeless tobacco. He reports that he does not drink alcohol or use illicit drugs. family history includes Heart disease in his father; Kidney Stones in his sister; Thyroid cancer in his sister and sister. Allergies  Allergen Reactions  . Oxycodone     Gives patient nightmares  . Rifampin Nausea Only      Review of Systems  Constitutional: Positive for appetite change and fatigue. Negative for  unexpected weight change.  Eyes: Positive for redness. Negative for pain, discharge and visual disturbance.  Respiratory: Negative for cough, chest tightness and shortness of breath.   Cardiovascular: Negative for chest pain, palpitations and leg swelling.  Neurological: Negative for dizziness, syncope, weakness, light-headedness and headaches.       Objective:   Physical Exam  Constitutional: He appears well-developed and well-nourished.  HENT:  Mouth/Throat: Oropharynx is clear and moist.  Eyes:  Small left subconjunctival hemorrhage. No hyphema  Neck: Neck supple. No thyromegaly present.  Cardiovascular: Normal rate and regular rhythm.   Pulmonary/Chest: Effort normal and breath sounds normal. No respiratory distress. He has no wheezes. He has no rales.  Abdominal: Soft. He exhibits no mass. There is no tenderness. There is no rebound and no guarding.  Musculoskeletal: He exhibits no edema.  Lymphadenopathy:    He has no cervical adenopathy.  Skin:  Hard callous left lateral foot.  No ulceration.            Assessment & Plan:  #1 recent loss of appetite. His weight is actually up from last visit. We recommend observation #2 small left subconjunctival hemorrhage. Benign exam. Resolving #3 callus left foot. We recommended podiatry evaluation and name given #4 hypothyroidism. History of recent under replacement. Recent adjustment of dosage. Plan repeat TSH at follow-up in one month

## 2014-11-11 NOTE — Progress Notes (Signed)
Pre visit review using our clinic review tool, if applicable. No additional management support is needed unless otherwise documented below in the visit note. 

## 2014-11-11 NOTE — Patient Instructions (Signed)
Be sure to get your thyroid rechecked in about one month.

## 2014-11-29 ENCOUNTER — Other Ambulatory Visit: Payer: Self-pay | Admitting: Family Medicine

## 2014-12-07 ENCOUNTER — Encounter: Payer: Self-pay | Admitting: Family Medicine

## 2014-12-07 ENCOUNTER — Ambulatory Visit (INDEPENDENT_AMBULATORY_CARE_PROVIDER_SITE_OTHER): Payer: Commercial Managed Care - HMO | Admitting: Family Medicine

## 2014-12-07 VITALS — BP 120/80 | HR 61 | Temp 98.2°F | Wt 195.0 lb

## 2014-12-07 DIAGNOSIS — E875 Hyperkalemia: Secondary | ICD-10-CM | POA: Diagnosis not present

## 2014-12-07 DIAGNOSIS — E038 Other specified hypothyroidism: Secondary | ICD-10-CM | POA: Diagnosis not present

## 2014-12-07 LAB — BASIC METABOLIC PANEL
BUN: 27 mg/dL — ABNORMAL HIGH (ref 6–23)
CO2: 30 mEq/L (ref 19–32)
Calcium: 9.9 mg/dL (ref 8.4–10.5)
Chloride: 106 mEq/L (ref 96–112)
Creatinine, Ser: 1.32 mg/dL (ref 0.40–1.50)
GFR: 59.24 mL/min — ABNORMAL LOW (ref >60.00)
Glucose, Bld: 100 mg/dL — ABNORMAL HIGH (ref 70–99)
Potassium: 4.9 mEq/L (ref 3.5–5.1)
Sodium: 141 mEq/L (ref 135–145)

## 2014-12-07 LAB — TSH: TSH: 12.03 u[IU]/mL — ABNORMAL HIGH (ref 0.35–4.50)

## 2014-12-07 NOTE — Progress Notes (Signed)
Subjective:    Patient ID: Bradley [name], male    DOB: 1956/01/31, 59 y.o.   MRN: [id number]  HPI Seen for medical follow-up. Recent subconjunctival hemorrhage. This has fully resolved. No visual difficulties.  Hypothyroidism. Recent TSH over 40. He states he is compliant with therapy. We increased his dosage to 125 g. He's had some fatigue issues and these are slowly improving. No recent chest pains.  Recent mild hyperkalemia. He is cautious regarding foods. He is not taking any potassium supplement.  Past Medical History  Diagnosis Date  . Anxiety   . Depression   . Hypertension   . Hypothyroidism, postsurgical   . Aortic stenosis     s/p Bentall with bioprosthetic AVR 02/2010; Last echo (9/11): Moderate LVH, EF 45-50%, AVR functioning appropriately, aortic valve mean gradient 21, diastolic dysfunction. Chest MRA (2/13): Mild to moderate dilatation at the sinus of Valsalva at 4.1 cm, mild dilatation ascending aorta distal to the tube graft at 3.9 cm, moderate dilatation of the innominate artery a 2.1 cm;    . Hx of cardiac cath     a. LHC in 02/2010: normal cors  . Hx of echocardiogram 2014    Echo (10/14): Severe LVH, EF 60-65%, normal wall motion, grade 1 diastolic dysfunction, AVR functioning normally, mild aortic stenosis (mean 19), moderately dilated aorta, mild LAE, mild RVE  . Staphylococcus aureus bacteremia   . Prosthetic valve endocarditis   . Thyroid cancer   . Heart murmur   . ESRD on dialysis     "[place]; TTS" (10/22/2013)  . Hx of echocardiogram 02/2014    Echo (8/15):  Mod LV, EF 35-40%, Gr 1 DD, AVR ok (mean 14 mmHg), mild LAE   Past Surgical History  Procedure Laterality Date  . Cardiac catheterization  03/02/2010    NORMAL CORONARY ARTERY  . Sternotomy      REDO  . Transthoracic echocardiogram  03/2010    SHOWED MILD REDUCTION OF LV FUNCTION  . Thyroidectomy  ~ 2005  . Shoulder arthroscopy w/ rotator cuff repair Right 2012  . Av fistula placement  Left 10/08/2013    Procedure: ARTERIOVENOUS (AV) FISTULA CREATION- LEFT ARM; Radial Cephalic ;  Surgeon: Mal [name], MD;  Location: [place];  Service: Vascular;  Laterality: Left;  . Aortic valve replacement  2011  . Aortic valve repair  1968  . Tee without cardioversion N/A 10/24/2013    Procedure: TRANSESOPHAGEAL ECHOCARDIOGRAM (TEE);  Surgeon: Dorothy Spark, MD;  Location: [facility] ENDOSCOPY;  Service: Cardiovascular;  Laterality: N/A;  Deneen Harts procedure N/A 10/28/2013    Procedure: REDO BENTALL PROCEDURE, debridment of aoritc root abscess, replacement of aortic root, ascending aorta and aortic valve with homograft. Insertion of left femoral arterial line;  Surgeon: [name], MD;  Location: [place];  Service: Open Heart Surgery;  Laterality: N/A;  . Intraoperative transesophageal echocardiogram N/A 10/28/2013    Procedure: INTRAOPERATIVE TRANSESOPHAGEAL ECHOCARDIOGRAM;  Surgeon: [name], MD;  Location: [place];  Service: Open Heart Surgery;  Laterality: N/A;  . Cardiac valve replacement      reports that he has never smoked. He has never used smokeless tobacco. He reports that he does not drink alcohol or use illicit drugs. family history includes Heart disease in his father; Kidney Stones in his sister; Thyroid cancer in his sister and sister. Allergies  Allergen Reactions  . Oxycodone     Gives patient nightmares  . Rifampin Nausea Only  Review of Systems  Constitutional: Negative for fatigue.  Eyes: Negative for visual disturbance.  Respiratory: Negative for cough, chest tightness and shortness of breath.   Cardiovascular: Negative for chest pain, palpitations and leg swelling.  Neurological: Negative for dizziness, syncope, weakness, light-headedness and headaches.       Objective:   Physical Exam  Constitutional: He is oriented to person, place, and time. He appears well-developed and well-nourished.  HENT:  Right Ear: External ear normal.  Left Ear:  External ear normal.  Mouth/Throat: Oropharynx is clear and moist.  Eyes: Pupils are equal, round, and reactive to light.  Neck: Neck supple. No thyromegaly present.  Cardiovascular: Normal rate and regular rhythm.   Pulmonary/Chest: Effort normal and breath sounds normal. No respiratory distress. He has no wheezes. He has no rales.  Musculoskeletal: He exhibits no edema.  Neurological: He is alert and oriented to person, place, and time.          Assessment & Plan:  #1 hypothyroidism. Recheck TSH and titrate as necessary #2 hyperkalemia-history of.   Repeat basic metabolic panel

## 2014-12-07 NOTE — Progress Notes (Signed)
Pre visit review using our clinic review tool, if applicable. No additional management support is needed unless otherwise documented below in the visit note. 

## 2014-12-08 ENCOUNTER — Other Ambulatory Visit: Payer: Self-pay | Admitting: *Deleted

## 2014-12-08 MED ORDER — LEVOTHYROXINE SODIUM 137 MCG PO TABS: 137.0000 ug | ORAL_TABLET | Freq: Every day | ORAL | Status: DC

## 2014-12-21 ENCOUNTER — Encounter: Payer: Self-pay | Admitting: Internal Medicine

## 2014-12-21 ENCOUNTER — Ambulatory Visit (INDEPENDENT_AMBULATORY_CARE_PROVIDER_SITE_OTHER): Payer: Commercial Managed Care - HMO | Admitting: Internal Medicine

## 2014-12-21 VITALS — BP 119/83 | HR 62 | Ht 73.0 in | Wt 191.1 lb

## 2014-12-21 DIAGNOSIS — I5042 Chronic combined systolic (congestive) and diastolic (congestive) heart failure: Secondary | ICD-10-CM

## 2014-12-21 DIAGNOSIS — R2681 Unsteadiness on feet: Secondary | ICD-10-CM

## 2014-12-21 NOTE — Patient Instructions (Signed)
Medication Instructions:  Your physician recommends that you continue on your current medications as directed. Please refer to the Current Medication list given to you today.   Labwork: None   Testing/Procedures: None   Follow-Up: Your physician wants you to follow-up in: 6 months with Dr.Ross You will receive a reminder letter in the mail two months in advance. If you don't receive a letter, please call our office to schedule the follow-up appointment.   Any Other Special Instructions Will Be Listed Below (If Applicable). You have been referred to [facility]rology [facility]hab for balance/gait training

## 2014-12-21 NOTE — Progress Notes (Signed)
Cardiology Office Note   Date:  12/21/2014   ID:  Bradley [name], DOB 1955/09/25, MRN [id number]  PCP:  [name], MD  Cardiologist:   Dorris Carnes, MD   No chief complaint on file.     History of Present Illness: Bradley [name] is a 59 y.o. male with a history of aortic stenosis requiring aortic valvulotomy at age 31 and subsequent Bentall procedure in 02/2000 with placement of a pericardial tissue valve. Cardiac catheterization 02/2010 demonstrated normal coronary arteries. Other history includes LBBB, thyroid cancer, status post thyroidectomy in 2005, surgical hypothyroidism, HTN, depression, chronically elevated troponins.  He was admitted in 08/2013 with MSSA bacteremia and bacterial endocarditis with evidence of cerebral emboli. He had worsening renal function (possibly from gentamicin) and was started on hemodialysis. 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 He was found to have a perivalvular abscess on TEE and cardiac CTA and ultimately underwent redo Bentall procedure with homograft and debridement of root abscess. He required amiodarone in the OR for VT/VF (required multiple shocks). He continued to require hemodialysis. He had a prolonged hospitalization and was d/c 11/13/13.  Admitted [id number] with a/c systolic CHF. Echo demonstrated worsening LVF with EF 20-25%. ACEI and beta blocker were started. Cardiac CTA prior to his redo Bentall demonstrated normal cors. Of note, TSH was over 50 and his synthroid was adjusted.  I saw him august  Repeat echo after shows LVEF improved to 35 to 40% I saw the patient in December    He has been seen by B Burchette since  Feels tired  Gives out  TSH found to be signif elevated  He is no synthyroid now  May be getting a little better    Denies CP  No edema  Does get dizzy frequently  Breathing is fair  Current Outpatient Prescriptions  Medication Sig Dispense Refill  . aspirin EC 81 MG tablet  Take 81 mg by mouth daily.     . betamethasone valerate ointment (VALISONE) 0.1 % Apply 1 application topically 2 (two) times daily. 30 g 0  . clonazePAM (KLONOPIN) 1 MG tablet TAKE ONE TABLET BY MOUTH TWICE DAILY AS NEEDED FOR ANXIETY 15 tablet 0  . furosemide (LASIX) 20 MG tablet Take 1 tablet every Monday and Thursday 30 tablet 11  . levothyroxine (SYNTHROID, LEVOTHROID) 137 MCG tablet Take 1 tablet (137 mcg total) by mouth daily before breakfast. 90 tablet 0  . lisinopril (PRINIVIL,ZESTRIL) 2.5 MG tablet Take 1 tablet (2.5 mg total) by mouth daily. 90 tablet 3  . metoprolol tartrate (LOPRESSOR) 25 MG tablet TAKE ONE-HALF TABLET BY MOUTH TWICE DAILY 30 tablet 5  . Multiple Vitamins-Minerals (CENTRUM SILVER ADULT 50+) TABS Take 1 tablet by mouth daily.    . sertraline (ZOLOFT) 50 MG tablet TAKE ONE TABLET BY MOUTH ONCE DAILY 30 tablet 5   No current facility-administered medications for this visit.    Allergies:   Oxycodone and Rifampin   Past Medical History  Diagnosis Date  . Anxiety   . Depression   . Hypertension   . Hypothyroidism, postsurgical   . Aortic stenosis     s/p Bentall with bioprosthetic AVR 02/2010; Last echo (9/11): Moderate LVH, EF 45-50%, AVR functioning appropriately, aortic valve mean gradient 21, diastolic dysfunction. Chest MRA (2/13): Mild to moderate dilatation at the sinus of Valsalva at 4.1 cm, mild dilatation ascending aorta distal to the tube graft at 3.9 cm, moderate dilatation of the innominate artery a  2.1 cm;    . Hx of cardiac cath     a. LHC in 02/2010: normal cors  . Hx of echocardiogram 2014    Echo (10/14): Severe LVH, EF 60-65%, normal wall motion, grade 1 diastolic dysfunction, AVR functioning normally, mild aortic stenosis (mean 19), moderately dilated aorta, mild LAE, mild RVE  . Staphylococcus aureus bacteremia   . Prosthetic valve endocarditis   . Thyroid cancer   . Heart murmur   . ESRD on dialysis     "[place]; TTS" (10/22/2013)  .  Hx of echocardiogram 02/2014    Echo (8/15):  Mod LV, EF 35-40%, Gr 1 DD, AVR ok (mean 14 mmHg), mild LAE    Past Surgical History  Procedure Laterality Date  . Cardiac catheterization  03/02/2010    NORMAL CORONARY ARTERY  . Sternotomy      REDO  . Transthoracic echocardiogram  03/2010    SHOWED MILD REDUCTION OF LV FUNCTION  . Thyroidectomy  ~ 2005  . Shoulder arthroscopy w/ rotator cuff repair Right 2012  . Av fistula placement Left 10/08/2013    Procedure: ARTERIOVENOUS (AV) FISTULA CREATION- LEFT ARM; Radial Cephalic ;  Surgeon: Mal [name], MD;  Location: [place];  Service: Vascular;  Laterality: Left;  . Aortic valve replacement  2011  . Aortic valve repair  1968  . Tee without cardioversion N/A 10/24/2013    Procedure: TRANSESOPHAGEAL ECHOCARDIOGRAM (TEE);  Surgeon: Dorothy Spark, MD;  Location: [facility] ENDOSCOPY;  Service: Cardiovascular;  Laterality: N/A;  Deneen Harts procedure N/A 10/28/2013    Procedure: REDO BENTALL PROCEDURE, debridment of aoritc root abscess, replacement of aortic root, ascending aorta and aortic valve with homograft. Insertion of left femoral arterial line;  Surgeon: [name], MD;  Location: [place];  Service: Open Heart Surgery;  Laterality: N/A;  . Intraoperative transesophageal echocardiogram N/A 10/28/2013    Procedure: INTRAOPERATIVE TRANSESOPHAGEAL ECHOCARDIOGRAM;  Surgeon: [name], MD;  Location: [place];  Service: Open Heart Surgery;  Laterality: N/A;  . Cardiac valve replacement       Social History:  The patient  reports that he has never smoked. He has never used smokeless tobacco. He reports that he does not drink alcohol or use illicit drugs.   Family History:  The patient's family history includes Heart disease in his father; Kidney Stones in his sister; Thyroid cancer in his sister and sister.    ROS:  Please see the history of present illness. All other systems are reviewed and  Negative to the above problem except as noted.     PHYSICAL EXAM: VS:  BP 119/83 mmHg  Pulse 62  Ht 6\' 1"  (1.854 m)  Wt 191 lb 1.6 oz (86.682 kg)  BMI 25.22 kg/m2  GEN: Well nourished, well developed, in no acute distress HEENT: normal Neck: no JVD, carotid bruits, or masses Cardiac: RRR; no murmurs, rubs, or gallops,no edema  Respiratory:  clear to auscultation bilaterally, normal work of breathing GI: soft, nontender, nondistended, + BS  No hepatomegaly  MS: no deformity Moving all extremities   Skin: warm and dry, no rash Neuro:  Strength and sensation are intact Psych: euthymic mood, full affect   EKG:  EKG is ordered today.  SR 62 bpm  SL ST depression T wav inverision II, III, AVF.  (unchanged)   Lipid Panel    Component Value Date/Time   CHOL 145 09/25/2013 0452   TRIG 156* 09/25/2013 0452   HDL 27* 09/25/2013 [id number]  CHOLHDL 5.4 09/25/2013 0452   VLDL 31 09/25/2013 0452   LDLCALC 87 09/25/2013 0452      Wt Readings from Last 3 Encounters:  12/21/14 191 lb 1.6 oz (86.682 kg)  12/07/14 195 lb (88.451 kg)  11/11/14 197 lb (89.359 kg)      ASSESSMENT AND PLAN: 1  Aortic valve dz  S/P BEntall procedure  Slow recovery  Would set up for cardiac rehab.  2.  HYpothyroidism  Improving  Continue synthroid  3  Chronic systolic CHF  Volume status looks good  I would not push meds with his dizziness.       Signed, Dorris Carnes, MD  12/21/2014 12:04 PM    [facility] [address], [place], [place]  [address] Phone: [phone]; Fax: [phone]

## 2014-12-29 ENCOUNTER — Encounter (HOSPITAL_COMMUNITY): Payer: Self-pay | Admitting: Physical Medicine and Rehabilitation

## 2014-12-29 ENCOUNTER — Emergency Department (HOSPITAL_COMMUNITY): Payer: Commercial Managed Care - HMO

## 2014-12-29 ENCOUNTER — Telehealth: Payer: Self-pay | Admitting: Family Medicine

## 2014-12-29 ENCOUNTER — Inpatient Hospital Stay (HOSPITAL_COMMUNITY)
Admission: EM | Admit: 2014-12-29 | Discharge: 2015-01-02 | DRG: 243 | Disposition: A | Discharge status: Home / Self Care | Payer: Commercial Managed Care - HMO | Attending: Internal Medicine | Admitting: Internal Medicine

## 2014-12-29 ENCOUNTER — Other Ambulatory Visit (HOSPITAL_COMMUNITY): Payer: Self-pay

## 2014-12-29 DIAGNOSIS — F419 Anxiety disorder, unspecified: Secondary | ICD-10-CM | POA: Diagnosis present

## 2014-12-29 DIAGNOSIS — I429 Cardiomyopathy, unspecified: Secondary | ICD-10-CM | POA: Diagnosis not present

## 2014-12-29 DIAGNOSIS — Z85850 Personal history of malignant neoplasm of thyroid: Secondary | ICD-10-CM

## 2014-12-29 DIAGNOSIS — E890 Postprocedural hypothyroidism: Secondary | ICD-10-CM | POA: Diagnosis present

## 2014-12-29 DIAGNOSIS — Z885 Allergy status to narcotic agent status: Secondary | ICD-10-CM

## 2014-12-29 DIAGNOSIS — Z954 Presence of other heart-valve replacement: Secondary | ICD-10-CM | POA: Diagnosis not present

## 2014-12-29 DIAGNOSIS — E039 Hypothyroidism, unspecified: Secondary | ICD-10-CM | POA: Diagnosis not present

## 2014-12-29 DIAGNOSIS — R002 Palpitations: Secondary | ICD-10-CM | POA: Diagnosis not present

## 2014-12-29 DIAGNOSIS — I252 Old myocardial infarction: Secondary | ICD-10-CM

## 2014-12-29 DIAGNOSIS — N182 Chronic kidney disease, stage 2 (mild): Secondary | ICD-10-CM | POA: Diagnosis present

## 2014-12-29 DIAGNOSIS — F411 Generalized anxiety disorder: Secondary | ICD-10-CM | POA: Diagnosis not present

## 2014-12-29 DIAGNOSIS — I472 Ventricular tachycardia: Principal | ICD-10-CM | POA: Diagnosis present

## 2014-12-29 DIAGNOSIS — D696 Thrombocytopenia, unspecified: Secondary | ICD-10-CM | POA: Diagnosis present

## 2014-12-29 DIAGNOSIS — Z7982 Long term (current) use of aspirin: Secondary | ICD-10-CM | POA: Diagnosis not present

## 2014-12-29 DIAGNOSIS — R0602 Shortness of breath: Secondary | ICD-10-CM | POA: Diagnosis not present

## 2014-12-29 DIAGNOSIS — F329 Major depressive disorder, single episode, unspecified: Secondary | ICD-10-CM | POA: Diagnosis present

## 2014-12-29 DIAGNOSIS — R079 Chest pain, unspecified: Secondary | ICD-10-CM | POA: Diagnosis not present

## 2014-12-29 DIAGNOSIS — I10 Essential (primary) hypertension: Secondary | ICD-10-CM | POA: Diagnosis present

## 2014-12-29 DIAGNOSIS — I5042 Chronic combined systolic (congestive) and diastolic (congestive) heart failure: Secondary | ICD-10-CM | POA: Diagnosis present

## 2014-12-29 DIAGNOSIS — Z79899 Other long term (current) drug therapy: Secondary | ICD-10-CM | POA: Diagnosis not present

## 2014-12-29 DIAGNOSIS — Z95810 Presence of automatic (implantable) cardiac defibrillator: Secondary | ICD-10-CM | POA: Diagnosis not present

## 2014-12-29 DIAGNOSIS — Z952 Presence of prosthetic heart valve: Secondary | ICD-10-CM

## 2014-12-29 DIAGNOSIS — I129 Hypertensive chronic kidney disease with stage 1 through stage 4 chronic kidney disease, or unspecified chronic kidney disease: Secondary | ICD-10-CM | POA: Diagnosis present

## 2014-12-29 DIAGNOSIS — Z22321 Carrier or suspected carrier of Methicillin susceptible Staphylococcus aureus: Secondary | ICD-10-CM | POA: Diagnosis not present

## 2014-12-29 DIAGNOSIS — R0789 Other chest pain: Secondary | ICD-10-CM | POA: Diagnosis not present

## 2014-12-29 DIAGNOSIS — Z881 Allergy status to other antibiotic agents status: Secondary | ICD-10-CM

## 2014-12-29 DIAGNOSIS — F333 Major depressive disorder, recurrent, severe with psychotic symptoms: Secondary | ICD-10-CM | POA: Diagnosis present

## 2014-12-29 DIAGNOSIS — R5383 Other fatigue: Secondary | ICD-10-CM | POA: Diagnosis not present

## 2014-12-29 LAB — COMPREHENSIVE METABOLIC PANEL
ALT: 24 U/L (ref 17–63)
AST: 29 U/L (ref 15–41)
Albumin: 3.7 g/dL (ref 3.5–5.0)
Alkaline Phosphatase: 46 U/L (ref 38–126)
Anion gap: 6 (ref 5–15)
BUN: 20 mg/dL (ref 6–20)
CO2: 26 mmol/L (ref 22–32)
Calcium: 9.1 mg/dL (ref 8.9–10.3)
Chloride: 107 mmol/L (ref 101–111)
Creatinine, Ser: 1.29 mg/dL — ABNORMAL HIGH (ref 0.61–1.24)
GFR calc Af Amer: >60 mL/min (ref >60)
GFR calc non Af Amer: 60 mL/min — ABNORMAL LOW (ref >60)
Glucose, Bld: 93 mg/dL (ref 65–99)
Potassium: 3.8 mmol/L (ref 3.5–5.1)
Sodium: 139 mmol/L (ref 135–145)
Total Bilirubin: 0.7 mg/dL (ref 0.3–1.2)
Total Protein: 6 g/dL — ABNORMAL LOW (ref 6.5–8.1)

## 2014-12-29 LAB — CBC WITH DIFFERENTIAL/PLATELET
Basophils Absolute: 0.1 10*3/uL (ref 0.0–0.1)
Basophils Relative: 1 % (ref 0–1)
Eosinophils Absolute: 0.2 10*3/uL (ref 0.0–0.7)
Eosinophils Relative: 3 % (ref 0–5)
HCT: 41.1 % (ref 39.0–52.0)
Hemoglobin: 13.9 g/dL (ref 13.0–17.0)
Lymphocytes Relative: 22 % (ref 12–46)
Lymphs Abs: 1.4 10*3/uL (ref 0.7–4.0)
MCH: 30 pg (ref 26.0–34.0)
MCHC: 33.8 g/dL (ref 30.0–36.0)
MCV: 88.6 fL (ref 78.0–100.0)
Monocytes Absolute: 0.6 10*3/uL (ref 0.1–1.0)
Monocytes Relative: 9 % (ref 3–12)
Neutro Abs: 4.3 10*3/uL (ref 1.7–7.7)
Neutrophils Relative %: 65 % (ref 43–77)
Platelets: 146 10*3/uL — ABNORMAL LOW (ref 150–400)
RBC: 4.64 MIL/uL (ref 4.22–5.81)
RDW: 13.6 % (ref 11.5–15.5)
WBC: 6.5 10*3/uL (ref 4.0–10.5)

## 2014-12-29 LAB — I-STAT TROPONIN, ED: Troponin i, poc: 0.04 ng/mL (ref 0.00–0.08)

## 2014-12-29 MED ORDER — SODIUM CHLORIDE 0.9 % IV SOLN
INTRAVENOUS | Status: DC
  Administered 2014-12-29: via INTRAVENOUS

## 2014-12-29 MED ORDER — TRIAMCINOLONE ACETONIDE 0.1 % EX CREA
TOPICAL_CREAM | Freq: Two times a day (BID) | CUTANEOUS | Status: DC
  Administered 2014-12-30: 1 via TOPICAL
  Administered 2014-12-30 – 2014-12-31 (×3): via TOPICAL
  Filled 2014-12-29 (×3): qty 15

## 2014-12-29 MED ORDER — CENTRUM SILVER ADULT 50+ PO TABS: 1.0000 | ORAL_TABLET | ORAL | Status: DC

## 2014-12-29 MED ORDER — LISINOPRIL 2.5 MG PO TABS
2.5000 mg | ORAL_TABLET | Freq: Every day | ORAL | Status: DC
  Administered 2014-12-30 – 2015-01-02 (×3): 2.5 mg via ORAL
  Filled 2014-12-29 (×4): qty 1

## 2014-12-29 MED ORDER — ALUM & MAG HYDROXIDE-SIMETH 200-200-20 MG/5ML PO SUSP: 30.0000 mL | Freq: Four times a day (QID) | ORAL | Status: DC | PRN

## 2014-12-29 MED ORDER — SERTRALINE HCL 50 MG PO TABS
50.0000 mg | ORAL_TABLET | Freq: Every day | ORAL | Status: DC
  Administered 2014-12-30 – 2015-01-02 (×3): 50 mg via ORAL
  Filled 2014-12-29 (×4): qty 1

## 2014-12-29 MED ORDER — LEVOTHYROXINE SODIUM 25 MCG PO TABS
137.0000 ug | ORAL_TABLET | Freq: Every day | ORAL | Status: DC
  Administered 2014-12-30 – 2015-01-02 (×3): 137 ug via ORAL
  Filled 2014-12-29 (×7): qty 1

## 2014-12-29 MED ORDER — MORPHINE SULFATE 2 MG/ML IJ SOLN
1.0000 mg | INTRAMUSCULAR | Status: DC | PRN
  Administered 2015-01-01: 1 mg via INTRAVENOUS
  Filled 2014-12-29: qty 1

## 2014-12-29 MED ORDER — ADULT MULTIVITAMIN W/MINERALS CH
1.0000 | ORAL_TABLET | ORAL | Status: DC
  Administered 2014-12-30: 1 via ORAL
  Filled 2014-12-29 (×3): qty 1

## 2014-12-29 MED ORDER — ACETAMINOPHEN 325 MG PO TABS: 650.0000 mg | ORAL_TABLET | Freq: Four times a day (QID) | ORAL | Status: DC | PRN

## 2014-12-29 MED ORDER — SODIUM CHLORIDE 0.9 % IJ SOLN
3.0000 mL | Freq: Two times a day (BID) | INTRAMUSCULAR | Status: DC
  Administered 2014-12-30 – 2015-01-01 (×3): 3 mL via INTRAVENOUS

## 2014-12-29 MED ORDER — ATORVASTATIN CALCIUM 40 MG PO TABS
40.0000 mg | ORAL_TABLET | Freq: Every day | ORAL | Status: DC
  Administered 2014-12-30 – 2015-01-01 (×3): 40 mg via ORAL
  Filled 2014-12-29 (×4): qty 1

## 2014-12-29 MED ORDER — NITROGLYCERIN 0.4 MG SL SUBL: 0.4000 mg | SUBLINGUAL_TABLET | SUBLINGUAL | Status: DC | PRN

## 2014-12-29 MED ORDER — METOPROLOL TARTRATE 12.5 MG HALF TABLET
12.5000 mg | ORAL_TABLET | Freq: Two times a day (BID) | ORAL | Status: DC
  Administered 2014-12-30 (×2): 12.5 mg via ORAL
  Filled 2014-12-29 (×4): qty 1

## 2014-12-29 MED ORDER — DILTIAZEM HCL 25 MG/5ML IV SOLN
50.0000 mg | Freq: Once | INTRAVENOUS | Status: DC
  Filled 2014-12-29: qty 10

## 2014-12-29 MED ORDER — PANTOPRAZOLE SODIUM 40 MG PO TBEC
40.0000 mg | DELAYED_RELEASE_TABLET | Freq: Every day | ORAL | Status: DC
  Administered 2014-12-30 – 2015-01-02 (×3): 40 mg via ORAL
  Filled 2014-12-29 (×3): qty 1

## 2014-12-29 MED ORDER — ASPIRIN EC 325 MG PO TBEC
325.0000 mg | DELAYED_RELEASE_TABLET | Freq: Every day | ORAL | Status: DC
  Administered 2014-12-30: 325 mg via ORAL
  Filled 2014-12-29 (×2): qty 1

## 2014-12-29 MED ORDER — CLONAZEPAM 1 MG PO TABS
1.0000 mg | ORAL_TABLET | Freq: Two times a day (BID) | ORAL | Status: DC | PRN
Start: 2014-12-29 — End: 2015-01-02
  Administered 2014-12-30 – 2015-01-01 (×2): 1 mg via ORAL
  Filled 2014-12-29 (×2): qty 1

## 2014-12-29 MED ORDER — ACETAMINOPHEN 650 MG RE SUPP: 650.0000 mg | Freq: Four times a day (QID) | RECTAL | Status: DC | PRN

## 2014-12-29 MED ORDER — HEPARIN SODIUM (PORCINE) 5000 UNIT/ML IJ SOLN
5000.0000 [IU] | Freq: Three times a day (TID) | INTRAMUSCULAR | Status: DC
  Administered 2014-12-30 – 2014-12-31 (×6): 5000 [IU] via SUBCUTANEOUS
  Filled 2014-12-29 (×10): qty 1

## 2014-12-29 MED ORDER — DILTIAZEM HCL 25 MG/5ML IV SOLN: 50.0000 mg | Freq: Once | INTRAVENOUS | Status: DC

## 2014-12-29 NOTE — ED Notes (Signed)
Pt states that he ate a doughnut about 20 minutes before the IV was started. Pt states that he felt like his stomach was "spasiming" after he ate. Pt states that when he is home and he has had these episodes he has usually eaten shortly before hand.

## 2014-12-29 NOTE — ED Provider Notes (Addendum)
CSN: [id number]     Arrival date & time 12/29/14  1817 History   First MD Initiated Contact with Patient 12/29/14 1900     Chief Complaint  Patient presents with  . Chest Pain  . Shortness of Breath  . Palpitations  . Near Syncope     (Consider location/radiation/quality/duration/timing/severity/associated sxs/prior Treatment) HPI  The patient is a 59 year old male with a very complicated cardiac history including a history of aortic stenosis initially treated with a bovine valve, unfortunately he developed a abscess at the root of the aorta requiring a repeat procedure and replacement with another valve. He now has a cadaver valve, he takes a baby aspirin daily. He has no history of coronary disease, he had a cardiac catheterization in 2011 showing no obstructions, he states that yesterday he developed chest pain last night, this was associated with a rapid heartbeat, it resolved after taking half of a Lopressor which she has at home for his heart. This lasted for greater than 10 minutes before resolving and was associated with lightheadedness and feeling faint. This happened again today, was acute in onset just prior to arrival, lasted for 15 minutes before resolving. Again the patient took the beta blocker prior to arrival. He denies any coughing, back pain, has no chest pain at this time, no swelling of the legs, no fevers chills nausea or vomiting, no headache. He denies any new medications including over-the-counter medications, anti-histamines, stimulants, cough medications, decongestions, increased caffeine intake and denies any alcohol or tobacco use though he sometimes smokes marijuana.  Past Medical History  Diagnosis Date  . Anxiety   . Depression   . Hypertension   . Hypothyroidism, postsurgical   . Aortic stenosis     s/p Bentall with bioprosthetic AVR 02/2010; Last echo (9/11): Moderate LVH, EF 45-50%, AVR functioning appropriately, aortic valve mean gradient 21, diastolic  dysfunction. Chest MRA (2/13): Mild to moderate dilatation at the sinus of Valsalva at 4.1 cm, mild dilatation ascending aorta distal to the tube graft at 3.9 cm, moderate dilatation of the innominate artery a 2.1 cm;    . Hx of cardiac cath     a. LHC in 02/2010: normal cors  . Hx of echocardiogram 2014    Echo (10/14): Severe LVH, EF 60-65%, normal wall motion, grade 1 diastolic dysfunction, AVR functioning normally, mild aortic stenosis (mean 19), moderately dilated aorta, mild LAE, mild RVE  . Staphylococcus aureus bacteremia   . Prosthetic valve endocarditis   . Thyroid cancer   . Heart murmur   . ESRD on dialysis     "[place]; TTS" (10/22/2013)  . Hx of echocardiogram 02/2014    Echo (8/15):  Mod LV, EF 35-40%, Gr 1 DD, AVR ok (mean 14 mmHg), mild LAE   Past Surgical History  Procedure Laterality Date  . Cardiac catheterization  03/02/2010    NORMAL CORONARY ARTERY  . Sternotomy      REDO  . Transthoracic echocardiogram  03/2010    SHOWED MILD REDUCTION OF LV FUNCTION  . Thyroidectomy  ~ 2005  . Shoulder arthroscopy w/ rotator cuff repair Right 2012  . Av fistula placement Left 10/08/2013    Procedure: ARTERIOVENOUS (AV) FISTULA CREATION- LEFT ARM; Radial Cephalic ;  Surgeon: Mal [name], MD;  Location: [place];  Service: Vascular;  Laterality: Left;  . Aortic valve replacement  2011  . Aortic valve repair  1968  . Tee without cardioversion N/A 10/24/2013    Procedure: TRANSESOPHAGEAL ECHOCARDIOGRAM (TEE);  Surgeon:  Dorothy Spark, MD;  Location: [facility] ENDOSCOPY;  Service: Cardiovascular;  Laterality: N/ADeneen Harts procedure N/A 10/28/2013    Procedure: REDO BENTALL PROCEDURE, debridment of aoritc root abscess, replacement of aortic root, ascending aorta and aortic valve with homograft. Insertion of left femoral arterial line;  Surgeon: [name], MD;  Location: [place];  Service: Open Heart Surgery;  Laterality: N/A;  . Intraoperative transesophageal echocardiogram N/A  10/28/2013    Procedure: INTRAOPERATIVE TRANSESOPHAGEAL ECHOCARDIOGRAM;  Surgeon: [name], MD;  Location: [place];  Service: Open Heart Surgery;  Laterality: N/A;  . Cardiac valve replacement     Family History  Problem Relation Age of Onset  . Heart disease Father   . Thyroid cancer Sister   . Thyroid cancer Sister   . Kidney Stones Sister    History  Substance Use Topics  . Smoking status: Never Smoker   . Smokeless tobacco: Never Used  . Alcohol Use: No     Comment: Occasional    Review of Systems  All other systems reviewed and are negative.     Allergies  Oxycodone and Rifampin  Home Medications   Prior to Admission medications   Medication Sig Start Date End Date Taking? Authorizing Provider  aspirin EC 81 MG tablet Take 81 mg by mouth daily.    Yes Historical Provider, MD  betamethasone valerate ointment (VALISONE) 0.1 % Apply 1 application topically 2 (two) times daily. Patient taking differently: Apply 1 application topically as needed (FOR FOREHEAD, NOSE AREA).  07/06/14  Yes Fay Records, MD  clonazePAM (KLONOPIN) 1 MG tablet TAKE ONE TABLET BY MOUTH TWICE DAILY AS NEEDED FOR ANXIETY 10/19/14  Yes [name], MD  furosemide (LASIX) 20 MG tablet Take 1 tablet every Monday and Thursday Patient taking differently: Take 20 mg by mouth 2 (two) times a week. Take 1 tablet every Monday and Thursday 07/06/14  Yes Fay Records, MD  levothyroxine (SYNTHROID, LEVOTHROID) 137 MCG tablet Take 1 tablet (137 mcg total) by mouth daily before breakfast. 12/08/14  Yes [name], MD  lisinopril (PRINIVIL,ZESTRIL) 2.5 MG tablet Take 1 tablet (2.5 mg total) by mouth daily. 09/30/14  Yes Fay Records, MD  metoprolol tartrate (LOPRESSOR) 25 MG tablet TAKE ONE-HALF TABLET BY MOUTH TWICE DAILY 11/30/14  Yes [name], MD  Multiple Vitamins-Minerals (CENTRUM SILVER ADULT 50+) TABS Take 1 tablet by mouth every other day.    Yes Historical Provider, MD  sertraline  (ZOLOFT) 50 MG tablet TAKE ONE TABLET BY MOUTH ONCE DAILY 11/02/14  Yes [name], MD   BP 93/76 mmHg  Pulse 73  Temp(Src) 98.1 F (36.7 C)  Resp 19  SpO2 100% Physical Exam  Constitutional: He appears well-developed and well-nourished. No distress.  HENT:  Head: Normocephalic and atraumatic.  Mouth/Throat: Oropharynx is clear and moist. No oropharyngeal exudate.  Eyes: Conjunctivae and EOM are normal. Pupils are equal, round, and reactive to light. Right eye exhibits no discharge. Left eye exhibits no discharge. No scleral icterus.  Neck: Normal range of motion. Neck supple. No JVD present. No thyromegaly present.  Cardiovascular: Normal rate, regular rhythm, normal heart sounds and intact distal pulses.  Exam reveals no gallop and no friction rub.   No murmur heard. Pulmonary/Chest: Effort normal and breath sounds normal. No respiratory distress. He has no wheezes. He has no rales.  Abdominal: Soft. Bowel sounds are normal. He exhibits no distension and no mass. There is no tenderness.  Musculoskeletal: Normal range of motion. He exhibits no edema or tenderness.  Lymphadenopathy:    He has no cervical adenopathy.  Neurological: He is alert. Coordination normal.  Skin: Skin is warm and dry. No rash noted. No erythema.  Psychiatric: He has a normal mood and affect. His behavior is normal.  Nursing note and vitals reviewed.   ED Course  Procedures (including critical care time) Labs Review Labs Reviewed  CBC WITH DIFFERENTIAL/PLATELET - Abnormal; Notable for the following:    Platelets 146 (*)    All other components within normal limits  COMPREHENSIVE METABOLIC PANEL - Abnormal; Notable for the following:    Creatinine, Ser 1.29 (*)    Total Protein 6.0 (*)    GFR calc non Af Amer 60 (*)    All other components within normal limits  Randolm Idol, ED    Imaging Review Dg Chest 2 View  12/29/2014   CLINICAL DATA:  Chest pain and shortness of breath  EXAM: CHEST  2  VIEW  COMPARISON:  March 26, 2014  FINDINGS: Patient is status post median sternotomy. There is no edema or consolidation. The heart size and pulmonary vascularity are normal. No adenopathy. No bone lesions.  IMPRESSION: No edema or consolidation.   Electronically Signed   By: Lowella Grip III M.D.   On: 12/29/2014 19:57     EKG Interpretation   Date/Time:  Tuesday Dec 29 2014 18:34:10 EDT Ventricular Rate:  64 PR Interval:  226 QRS Duration: 108 QT Interval:  410 QTC Calculation: 422 R Axis:   14 Text Interpretation:  Sinus rhythm with 1st degree A-V block Possible  Inferior infarct , age undetermined Abnormal ECG anterior elevations seen  previously, borderline inferior elevations are new Confirmed by POLLINA   MD, CHRISTOPHER [phone]) on 12/29/2014 6:43:32 PM      MDM   Final diagnoses:  Palpitations  Chest pain, unspecified chest pain type    At this time the patient appears well, I am concerned that he has intermittent palpitations, they have measured this at home with heart rates over 160 on his home monitoring, this is a blood pressure cuff but correlates with his fast heartbeat. At this time he has normal heartbeat, his EKG shows some abnormal findings TWI in the inferior leads.  Needs admission for observation.  Care was discussed with the admitting hospitalist who will admit. Discussed with the cardiologist, no indication for cardiology intervention, cycle troponins, cardiac monitoring overnight.  Prior to the patient being transferred to the floor he developed an acute wide complex regular tachycardia at 160 bpm. This is similar in rate to the prior after mentioned arrhythmias at home, I tried Valsalva maneuvers without improvement, during carotid massage after approximately 5 minutes of arrhythmia the patient had spontaneous conversion back to a sinus rhythm. Evaluation with the cardiologist was reinitiated, he will come to see the patient.  Noemi Chapel, MD 12/29/14  [id number]  Noemi Chapel, MD 12/29/14 2228

## 2014-12-29 NOTE — Telephone Encounter (Signed)
Per Dr. B patient needs to go to the ED. Pt verbally understands.

## 2014-12-29 NOTE — ED Notes (Signed)
Cardiology has been paged again to MD

## 2014-12-29 NOTE — ED Notes (Signed)
Pt presents to department for evaluation of diffuse chest pain, palpitations, near syncope ad SOB. Ongoing x2 weeks. History of valve replacement. reoprts 7/10 chest pressure at the time. Respirations unlabored. Pt is alert and oriented x4.

## 2014-12-29 NOTE — Telephone Encounter (Signed)
[facility] - Client [place] [facility] Patient Name: [name] DOB: 1956/03/26 Initial Comment caller states her husband had heart surgery a year ago - for the past week he has turned pale sweaty and his heart rate gets very high - feels chest pain Nurse Assessment Nurse: Marcelline Deist, RN, Kermit Balo Date/Time (Eastern Time): 12/29/2014 10:00:30 AM Confirm and document reason for call. If symptomatic, describe symptoms. ---Caller states her husband had heart surgery a year ago, an infected heart valve. For the past week, he has had episodes, he has turned pale, gets sweaty and his heart rate gets very high. He also feels chest pain. It also seems like he can't think clearly during episodes, or makes no sense. The episodes have been occurring in the evenings, last night had a bad one around 10 pm. Caller wanted to call 911, but pt. didn't want her to. The episode lasted 20 mins. Has the patient traveled out of the country within the last 30 days? ---Not Applicable Does the patient require triage? ---Yes Related visit to physician within the last 2 weeks? ---No Does the PT have any chronic conditions? (i.e. diabetes, asthma, etc.) ---Yes List chronic conditions. ---heart hx, thyroid issue Guidelines Guideline Title Affirmed Question Affirmed Notes Chest Pain [1] Intermittent chest pain or "angina" AND [2] increasing in severity or frequency (Exception: pains lasting a few seconds) Final Disposition User Go to ED Now Marcelline Deist, RN, Lynda Comments Caller states she is currently at work, hopes to leave early, but right now there is no one available to take pt. to the ER. She states he is probably outside dealing with animals, etc, doing more than he should. States his heart function is not good to begin with & these episodes are occurring more frequently.

## 2014-12-29 NOTE — ED Notes (Signed)
Pts heart rate back to 74 with "caroitd massage" by Dr. [name] Heck.

## 2014-12-29 NOTE — ED Notes (Addendum)
Floor updated on plan

## 2014-12-29 NOTE — ED Notes (Signed)
Per Dr. [name] Heck pt is not to leave ED until seen by Cardiology

## 2014-12-29 NOTE — H&P (Signed)
[facility] History and Physical  Bradley [name] [id number] DOB: Aug 02, 1955 DOA: 12/29/2014  Referring physician: ED physician PCP: [name], MD  Specialists:   Chief Complaint: Chest pain, feeling faint palpitation.  HPI: Bradley [name] is a 59 y.o. male with PMH of hypertension, history of thyroid cancer (post status of thyroidectomy), hypothyroidism, depression, anxiety, AVR, hx of aortic root abscess, chronic kidney disease-stage II, combined systolic and diastolic congestive heart failure, who presents with chest pain, palpitation and fainting episodes.  Patient reports that in the past 2 weeks, he has been having mild palpitation and several episode of feeling faint. He feels like " head is filled with water'. Last night, he developed chest pain, which is located in the substernal area, 8 out of 10 in severity, pressure-like, non-radiating. It lasted for approximately 20 minutes. It was associated with palpitation which resolved after he took Lopressor. His chest pain is not pleuritic. No tenderness over the calf areas. He reports that he had an another episode of chest pain today, which is similar to previous episode, and lasted for approximately 10 minutes. Currently patient does not have chest pain. He reports he has very mild soreness in abdomen. No diarrhea. No symptoms of UTI.  Of note, patient has very complicated cardiac history including history of aortic stenosis, initially treated with a bovine valve. He developed a abscess at the root of the aorta requiring a repeat procedure and replacement with another valve. He now has a cadaver valve, he takes a baby aspirin daily. He has no history of coronary disease, he had a cardiac catheterization in 2011 showing no obstructions.   Currently patient denies fever, chills, running nose, ear pain, headaches, cough, diarrhea, constipation, dysuria, urgency, frequency, hematuria, skin rashes, joint pain or leg swelling. No  unilateral weakness, numbness or tingling sensations. No vision change or hearing loss.  In ED, patient was found to have T-wave inversion in inferior leads and mild ST depression in V4 to V5, which are similar, but little worse than previous EKG on 02/04/14 and 02/05/14. First degree AV block, mild ST elevation on aVR and V1. Troponin negative, WBC 6.5, temperature normal, stable renal function, chest x-ray negative. Patient was admitted to inpatient for further evaluation and treatment. Of note, patient had one episode of palpitation in ED, EKG showed monomorphic V-tach. Cardiology was consulted by ED.  Where does patient live?   At home    Can patient participate in ADLs?  Yes     Review of Systems:   General: no fevers, chills, no changes in body weight, has poor appetite, has fatigue HEENT: no blurry vision, hearing changes or sore throat Pulm: No dyspnea, coughing, wheezing CV: has chest pain, palpitations Abd: no nausea, vomiting, has mild abdominal soreness, No diarrhea, constipation GU: no dysuria, burning on urination, increased urinary frequency, hematuria  Ext: no leg edema Neuro: no unilateral weakness, numbness, or tingling, no vision change or hearing loss Skin: no rash MSK: No muscle spasm, no deformity, no limitation of range of movement in spin Heme: No easy bruising.  Travel history: No recent long distant travel.  Allergy:  Allergies  Allergen Reactions  . Oxycodone Other (See Comments)    Gives patient nightmares  . Rifampin Nausea Only    Past Medical History  Diagnosis Date  . Anxiety   . Depression   . Hypertension   . Hypothyroidism, postsurgical   . Aortic stenosis     s/p Bentall with bioprosthetic AVR 02/2010; Last echo (  9/11): Moderate LVH, EF 45-50%, AVR functioning appropriately, aortic valve mean gradient 21, diastolic dysfunction. Chest MRA (2/13): Mild to moderate dilatation at the sinus of Valsalva at 4.1 cm, mild dilatation ascending aorta distal to  the tube graft at 3.9 cm, moderate dilatation of the innominate artery a 2.1 cm;    . Hx of cardiac cath     a. LHC in 02/2010: normal cors  . Hx of echocardiogram 2014    Echo (10/14): Severe LVH, EF 60-65%, normal wall motion, grade 1 diastolic dysfunction, AVR functioning normally, mild aortic stenosis (mean 19), moderately dilated aorta, mild LAE, mild RVE  . Staphylococcus aureus bacteremia   . Prosthetic valve endocarditis   . Thyroid cancer   . Heart murmur   . ESRD on dialysis     "[place]; TTS" (10/22/2013)  . Hx of echocardiogram 02/2014    Echo (8/15):  Mod LV, EF 35-40%, Gr 1 DD, AVR ok (mean 14 mmHg), mild LAE    Past Surgical History  Procedure Laterality Date  . Cardiac catheterization  03/02/2010    NORMAL CORONARY ARTERY  . Sternotomy      REDO  . Transthoracic echocardiogram  03/2010    SHOWED MILD REDUCTION OF LV FUNCTION  . Thyroidectomy  ~ 2005  . Shoulder arthroscopy w/ rotator cuff repair Right 2012  . Av fistula placement Left 10/08/2013    Procedure: ARTERIOVENOUS (AV) FISTULA CREATION- LEFT ARM; Radial Cephalic ;  Surgeon: Mal [name], MD;  Location: [place];  Service: Vascular;  Laterality: Left;  . Aortic valve replacement  2011  . Aortic valve repair  1968  . Tee without cardioversion N/A 10/24/2013    Procedure: TRANSESOPHAGEAL ECHOCARDIOGRAM (TEE);  Surgeon: Dorothy Spark, MD;  Location: [facility] ENDOSCOPY;  Service: Cardiovascular;  Laterality: N/A;  Deneen Harts procedure N/A 10/28/2013    Procedure: REDO BENTALL PROCEDURE, debridment of aoritc root abscess, replacement of aortic root, ascending aorta and aortic valve with homograft. Insertion of left femoral arterial line;  Surgeon: [name], MD;  Location: [place];  Service: Open Heart Surgery;  Laterality: N/A;  . Intraoperative transesophageal echocardiogram N/A 10/28/2013    Procedure: INTRAOPERATIVE TRANSESOPHAGEAL ECHOCARDIOGRAM;  Surgeon: [name], MD;  Location: [place];  Service:  Open Heart Surgery;  Laterality: N/A;  . Cardiac valve replacement      Social History:  reports that he has never smoked. He has never used smokeless tobacco. He reports that he does not drink alcohol or use illicit drugs.  Family History:  Family History  Problem Relation Age of Onset  . Heart disease Father   . Thyroid cancer Sister   . Thyroid cancer Sister   . Kidney Stones Sister      Prior to Admission medications   Medication Sig Start Date End Date Taking? Authorizing Provider  aspirin EC 81 MG tablet Take 81 mg by mouth daily.    Yes Historical Provider, MD  betamethasone valerate ointment (VALISONE) 0.1 % Apply 1 application topically 2 (two) times daily. Patient taking differently: Apply 1 application topically as needed (FOR FOREHEAD, NOSE AREA).  07/06/14  Yes Fay Records, MD  clonazePAM (KLONOPIN) 1 MG tablet TAKE ONE TABLET BY MOUTH TWICE DAILY AS NEEDED FOR ANXIETY 10/19/14  Yes [name], MD  furosemide (LASIX) 20 MG tablet Take 1 tablet every Monday and Thursday Patient taking differently: Take 20 mg by mouth 2 (two) times a week. Take 1 tablet every Monday and Thursday  07/06/14  Yes Fay Records, MD  levothyroxine (SYNTHROID, LEVOTHROID) 137 MCG tablet Take 1 tablet (137 mcg total) by mouth daily before breakfast. 12/08/14  Yes [name], MD  lisinopril (PRINIVIL,ZESTRIL) 2.5 MG tablet Take 1 tablet (2.5 mg total) by mouth daily. 09/30/14  Yes Fay Records, MD  metoprolol tartrate (LOPRESSOR) 25 MG tablet TAKE ONE-HALF TABLET BY MOUTH TWICE DAILY 11/30/14  Yes [name], MD  Multiple Vitamins-Minerals (CENTRUM SILVER ADULT 50+) TABS Take 1 tablet by mouth every other day.    Yes Historical Provider, MD  sertraline (ZOLOFT) 50 MG tablet TAKE ONE TABLET BY MOUTH ONCE DAILY 11/02/14  Yes [name], MD    Physical Exam: Filed Vitals:   12/29/14 2200 12/29/14 2215 12/29/14 2230 12/29/14 2245  BP: 122/77 119/84 109/81 115/80  Pulse: 64 65 63 64   Temp:      Resp: 15 15 18 15   SpO2: 98% 100% 99% 99%   General: Not in acute distress HEENT:       Eyes: PERRL, EOMI, no scleral icterus.       ENT: No discharge from the ears and nose, no pharynx injection, no tonsillar enlargement.        Neck: No JVD, no bruit, no mass felt. Heme: No neck lymph node enlargement. Cardiac: [id number], RRR, 2/6 systolic murmurs, No gallops or rubs. Pulm: Good air movement bilaterally. No rales, wheezing, rhonchi or rubs. Abd: Soft, nondistended, minimal tenderness diffusely, no rebound pain, no organomegaly, BS present. Ext: No pitting leg edema bilaterally. 2+DP/PT pulse bilaterally. Musculoskeletal: No joint deformities, No joint redness or warmth, no limitation of ROM in spin. Skin: No rashes.  Neuro: Alert, oriented X3, cranial nerves II-XII grossly intact, muscle strength 5/5 in all extremities, sensation to light touch intact.  Psych: Patient is not psychotic, no suicidal or hemocidal ideation.  Labs on Admission:  Basic Metabolic Panel:  Recent Labs Lab 12/29/14 1848  NA 139  K 3.8  CL 107  CO2 26  GLUCOSE 93  BUN 20  CREATININE 1.29*  CALCIUM 9.1   Liver Function Tests:  Recent Labs Lab 12/29/14 1848  AST 29  ALT 24  ALKPHOS 46  BILITOT 0.7  PROT 6.0*  ALBUMIN 3.7   No results for input(s): LIPASE, AMYLASE in the last 168 hours. No results for input(s): AMMONIA in the last 168 hours. CBC:  Recent Labs Lab 12/29/14 1848  WBC 6.5  NEUTROABS 4.3  HGB 13.9  HCT 41.1  MCV 88.6  PLT 146*   Cardiac Enzymes: No results for input(s): CKTOTAL, CKMB, CKMBINDEX, TROPONINI in the last 168 hours.  BNP (last 3 results) No results for input(s): BNP in the last 8760 hours.  ProBNP (last 3 results)  Recent Labs  01/06/14 1815  PROBNP 9238.0*    CBG: No results for input(s): GLUCAP in the last 168 hours.  Radiological Exams on Admission: Dg Chest 2 View  12/29/2014   CLINICAL DATA:  Chest pain and shortness of breath   EXAM: CHEST  2 VIEW  COMPARISON:  March 26, 2014  FINDINGS: Patient is status post median sternotomy. There is no edema or consolidation. The heart size and pulmonary vascularity are normal. No adenopathy. No bone lesions.  IMPRESSION: No edema or consolidation.   Electronically Signed   By: Lowella Grip III M.D.   On: 12/29/2014 19:57    EKG: Independently reviewed.  Abnormal findings:   T-wave inversion in inferior leads and mild ST depression  in V4 to V5, which are similar, but little worse than previous EKG on 02/04/14 and 02/05/14. First degree AV block, mild ST elevation on aVR and V1.   Assessment/Plan Principal Problem:   Chest pain Active Problems:   Hypothyroidism   AVR- Bentall 2011, re-do March 2015 secondary to valve endocarditis   Hypertension    Major depressive disorder, recurrent, severe with psychotic features- hospitalized in Jan 2013   Generalized anxiety disorder   Chronic combined systolic and diastolic CHF (congestive heart failure)   CKD (chronic kidney disease), stage II   Palpitations  Chest pain: Given patient's significant cardiac history, it is important to rule out ACS. No pneumonia on chest x-ray. Less likely to have pulmonary embolism (chest is not pleuratic,  oxygen saturation is 97% at room air, Well's score is low probability). Cardiology was consulted by ED.` Plan  - will admit to SDU - cycle CE q6 x3 and repeat her EKG in the am  - prn Nitroglycerin, Morphine - increase aspirin dose from 81 to 325 mg daily - start lipitor  - Risk factor stratification: will check FLP and A1C  - follow Cardiologist's recommendation - 2d echo  Palpitation and feeling of faint: Patient had one episode of palpitation in ED, stat EKG showed monomorphic V-tach. It is likely that patient had cardiac arrhythmia, such as V. tach at home, causing palpitations and fainting episode. -monitoring on tele -check TSH -check ortho static signs -follow cardiologist's  recommendation -on metoprolol  Abdominal soreness: unclear etiology. His examination is benign. No acute abdomen -check lipase -start Protonix empirically for possible GERD  Hypothyroidism: Recent TSH was 12.03 on 12/07/14, his Synthroid dose was adjusted recently. Currently is on 137 g daily. -Continue current dose of Synthroid  -check TSH  AVR- Bentall 2011, re-do March 2015 secondary to valve endocarditis:  He now has a cadaver valve, he takes a baby aspirin daily. -continue ASA  Depression and generalized anxiety disorder: stable. No suicidal or homicidal ideations. -Continue clonidine and Zoloft  Chronic combined systolic and diastolic CHF: 2-D echo on 03/19/14 showed EF 35-40% with grade 1 diastolic dysfunction. CHF is compensated on admission. Patient does not have any leg edema. He is on Lasix 20 mg twice a week at home. - Hold Lasix -Check BNP  CKD (chronic kidney disease), stage II: Baseline creatinine 1.3. His creatinine is 1.29 on admission, which is at baseline.  -follow-up renal function. BMP  HTN: -lisinopril -metoprolol   DVT ppx: SQ Heparin       Code Status: Full code Family Communication: Yes, patient's  family  at bed side Disposition Plan: Admit to inpatient   Date of Service 12/29/2014    Ivor Costa [facility] Pager [phone]  If 7PM-7AM, please contact night-coverage [email or web address] Password [facility] 12/29/2014, 10:48 PM

## 2014-12-29 NOTE — ED Notes (Signed)
Patient transported to X-ray 

## 2014-12-29 NOTE — ED Notes (Signed)
Dr, [name] Heck at bedside

## 2014-12-29 NOTE — ED Notes (Signed)
Pts heart rate increased to 160, Dr. [name] Heck notified.

## 2014-12-30 ENCOUNTER — Inpatient Hospital Stay (HOSPITAL_COMMUNITY): Payer: Commercial Managed Care - HMO

## 2014-12-30 ENCOUNTER — Encounter (HOSPITAL_COMMUNITY): Payer: Self-pay | Admitting: Nurse Practitioner

## 2014-12-30 DIAGNOSIS — N182 Chronic kidney disease, stage 2 (mild): Secondary | ICD-10-CM

## 2014-12-30 DIAGNOSIS — R079 Chest pain, unspecified: Secondary | ICD-10-CM

## 2014-12-30 DIAGNOSIS — I5042 Chronic combined systolic (congestive) and diastolic (congestive) heart failure: Secondary | ICD-10-CM

## 2014-12-30 LAB — GLUCOSE, CAPILLARY: Glucose-Capillary: 79 mg/dL (ref 65–99)

## 2014-12-30 LAB — TROPONIN I
Troponin I: 0.03 ng/mL (ref <0.031)
Troponin I: 0.04 ng/mL — ABNORMAL HIGH (ref <0.031)
Troponin I: <0.03 ng/mL (ref <0.031)

## 2014-12-30 LAB — PROTIME-INR
INR: 1.12 (ref 0.00–1.49)
Prothrombin Time: 14.5 seconds (ref 11.6–15.2)

## 2014-12-30 LAB — LIPID PANEL
Cholesterol: 168 mg/dL (ref 0–200)
HDL: 43 mg/dL (ref >40)
LDL Cholesterol: 107 mg/dL — ABNORMAL HIGH (ref 0–99)
Total CHOL/HDL Ratio: 3.9 RATIO
Triglycerides: 89 mg/dL (ref <150)
VLDL: 18 mg/dL (ref 0–40)

## 2014-12-30 LAB — LIPASE, BLOOD: Lipase: 24 U/L (ref 22–51)

## 2014-12-30 LAB — CBC
HCT: 39 % (ref 39.0–52.0)
Hemoglobin: 13.2 g/dL (ref 13.0–17.0)
MCH: 29.9 pg (ref 26.0–34.0)
MCHC: 33.8 g/dL (ref 30.0–36.0)
MCV: 88.4 fL (ref 78.0–100.0)
Platelets: 152 10*3/uL (ref 150–400)
RBC: 4.41 MIL/uL (ref 4.22–5.81)
RDW: 13.6 % (ref 11.5–15.5)
WBC: 5 10*3/uL (ref 4.0–10.5)

## 2014-12-30 LAB — BRAIN NATRIURETIC PEPTIDE: B Natriuretic Peptide: 550.5 pg/mL — ABNORMAL HIGH (ref 0.0–100.0)

## 2014-12-30 LAB — BASIC METABOLIC PANEL
Anion gap: 10 (ref 5–15)
BUN: 16 mg/dL (ref 6–20)
CO2: 24 mmol/L (ref 22–32)
Calcium: 8.6 mg/dL — ABNORMAL LOW (ref 8.9–10.3)
Chloride: 106 mmol/L (ref 101–111)
Creatinine, Ser: 1.19 mg/dL (ref 0.61–1.24)
GFR calc Af Amer: >60 mL/min (ref >60)
GFR calc non Af Amer: >60 mL/min (ref >60)
Glucose, Bld: 101 mg/dL — ABNORMAL HIGH (ref 65–99)
Potassium: 4 mmol/L (ref 3.5–5.1)
Sodium: 140 mmol/L (ref 135–145)

## 2014-12-30 LAB — MAGNESIUM: Magnesium: 2.1 mg/dL (ref 1.7–2.4)

## 2014-12-30 LAB — APTT: aPTT: 33 seconds (ref 24–37)

## 2014-12-30 LAB — TSH: TSH: 8.262 u[IU]/mL — ABNORMAL HIGH (ref 0.350–4.500)

## 2014-12-30 LAB — MRSA PCR SCREENING: MRSA by PCR: NEGATIVE

## 2014-12-30 MED ORDER — MUPIROCIN 2 % EX OINT
TOPICAL_OINTMENT | Freq: Two times a day (BID) | CUTANEOUS | Status: DC
Start: 2014-12-30 — End: 2015-01-02
  Administered 2014-12-30: 14:00:00 via NASAL
  Administered 2014-12-30: 1 via NASAL
  Administered 2014-12-31 – 2015-01-02 (×4): via NASAL
  Filled 2014-12-30 (×3): qty 22

## 2014-12-30 MED ORDER — LIDOCAINE BOLUS VIA INFUSION
50.0000 mg | Freq: Once | INTRAVENOUS | Status: AC
  Administered 2014-12-30: 50 mg via INTRAVENOUS
  Filled 2014-12-30: qty 52

## 2014-12-30 MED ORDER — CHLORHEXIDINE GLUCONATE 4 % EX LIQD
Freq: Once | CUTANEOUS | Status: AC
  Administered 2014-12-30: 1 via TOPICAL
  Filled 2014-12-30: qty 15

## 2014-12-30 MED ORDER — PERFLUTREN LIPID MICROSPHERE
1.0000 mL | INTRAVENOUS | Status: AC | PRN
  Administered 2014-12-30: 2 mL via INTRAVENOUS
  Filled 2014-12-30: qty 10

## 2014-12-30 MED ORDER — LIDOCAINE IN D5W 4-5 MG/ML-% IV SOLN
1.0000 mg/min | INTRAVENOUS | Status: DC
Start: 2014-12-30 — End: 2014-12-31
  Administered 2014-12-30: 1 mg/min via INTRAVENOUS
  Filled 2014-12-30: qty 500

## 2014-12-30 NOTE — Consult Note (Addendum)
CARDIOLOGY CONSULT NOTE   Patient ID: ALEXIO SHARPSTEEN MRN: [id number], DOB/AGE: 58-15-1957   Admit date: 12/29/2014 Date of Consult: 12/30/2014   Primary Physician: [name], MD Primary Cardiologist: Dorris Carnes, MD  Pt. Profile   52M with aortic stenosis requiring aortic valvulotomy at age 70 and subsequent Bentall procedure in 02/2000 with placement of a pericardial tissue valve and redo Bentall procedure with homograft in setting of endocarditis (2015) who presents with MMVT.  Problem List  Past Medical History  Diagnosis Date  . Anxiety   . Depression   . Hypertension   . Hypothyroidism, postsurgical   . Aortic stenosis     s/p Bentall with bioprosthetic AVR 02/2010; Last echo (9/11): Moderate LVH, EF 45-50%, AVR functioning appropriately, aortic valve mean gradient 21, diastolic dysfunction. Chest MRA (2/13): Mild to moderate dilatation at the sinus of Valsalva at 4.1 cm, mild dilatation ascending aorta distal to the tube graft at 3.9 cm, moderate dilatation of the innominate artery a 2.1 cm;    . Hx of cardiac cath     a. LHC in 02/2010: normal cors  . Hx of echocardiogram 2014    Echo (10/14): Severe LVH, EF 60-65%, normal wall motion, grade 1 diastolic dysfunction, AVR functioning normally, mild aortic stenosis (mean 19), moderately dilated aorta, mild LAE, mild RVE  . Staphylococcus aureus bacteremia   . Prosthetic valve endocarditis   . Thyroid cancer   . Heart murmur   . ESRD on dialysis     "[place]; TTS" (10/22/2013)  . Hx of echocardiogram 02/2014    Echo (8/15):  Mod LV, EF 35-40%, Gr 1 DD, AVR ok (mean 14 mmHg), mild LAE    Past Surgical History  Procedure Laterality Date  . Cardiac catheterization  03/02/2010    NORMAL CORONARY ARTERY  . Sternotomy      REDO  . Transthoracic echocardiogram  03/2010    SHOWED MILD REDUCTION OF LV FUNCTION  . Thyroidectomy  ~ 2005  . Shoulder arthroscopy w/ rotator cuff repair Right 2012  . Av fistula placement  Left 10/08/2013    Procedure: ARTERIOVENOUS (AV) FISTULA CREATION- LEFT ARM; Radial Cephalic ;  Surgeon: Mal [name], MD;  Location: [place];  Service: Vascular;  Laterality: Left;  . Aortic valve replacement  2011  . Aortic valve repair  1968  . Tee without cardioversion N/A 10/24/2013    Procedure: TRANSESOPHAGEAL ECHOCARDIOGRAM (TEE);  Surgeon: Dorothy Spark, MD;  Location: [facility] ENDOSCOPY;  Service: Cardiovascular;  Laterality: N/A;  Deneen Harts procedure N/A 10/28/2013    Procedure: REDO BENTALL PROCEDURE, debridment of aoritc root abscess, replacement of aortic root, ascending aorta and aortic valve with homograft. Insertion of left femoral arterial line;  Surgeon: [name], MD;  Location: [place];  Service: Open Heart Surgery;  Laterality: N/A;  . Intraoperative transesophageal echocardiogram N/A 10/28/2013    Procedure: INTRAOPERATIVE TRANSESOPHAGEAL ECHOCARDIOGRAM;  Surgeon: [name], MD;  Location: [place];  Service: Open Heart Surgery;  Laterality: N/A;  . Cardiac valve replacement       Allergies  Allergies  Allergen Reactions  . Oxycodone Other (See Comments)    Gives patient nightmares  . Rifampin Nausea Only    HPI   73M with aortic stenosis requiring aortic valvulotomy at age 67 and subsequent Bentall procedure in 02/2000 with placement of a pericardial tissue valve. Cardiac catheterization 02/2010 demonstrated normal coronary arteries. Other history includes LBBB, thyroid cancer, status post thyroidectomy in 2005, surgical hypothyroidism  with frequent synthroid changes, HTN, depression.  He was admitted in 08/2013 with MSSA bacteremia and bacterial endocarditis with evidence of cerebral emboli. He had worsening renal function (possibly from gentamicin) and was started on hemodialysis. 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 He was found to have a perivalvular abscess on TEE and cardiac CTA and ultimately underwent redo  Bentall procedure with homograft and debridement of root abscess. He required amiodarone in the OR for VT/VF (required multiple shocks). He continued to require hemodialysis. He had a prolonged hospitalization and was d/c 11/13/13.  Admitted [id number] with a/c systolic CHF. Echo demonstrated worsening LVF with EF 20-25%. ACEI and beta blocker were started. Cardiac CTA prior to his redo Bentall demonstrated normal cors. His last TTE (03/19/14) demonstrated EF improvement to 35-40%.   Mr. Gandee presents today for recurrent episodes of palpitations, chest pain, and pre-syncope over the past 2 weeks.  He reports almost daily episodes ("pale episodes") characterized by abrupt onset palpitations, blurry vision, LH, dizziness, AMS, and chest pain. He has not fainted. Episodes last 10-15 minutes. Of note, synthroid dose was increased from 125 to 137.5 2 weeks ago. Mrs. Halfpenny called the PCP office today and was instructed to bring Mr. Bingaman to the ER for evaluation.   He was hemodynamically stable on arrival to the ER. Labs were notable for K 3.8, Cr 1.29, POC TnI 0.04. CXR was clear. Initial EC demonstrated 1st degree AV block. Possible inferior MI. IVCD. Unchanged compared to 01/15/14. He was admitted by medicine. At 21:45, while still in the ER, he went into a regular WCT at 148bpm with evidence of AV dissociation c/w VT; he remained in this rhythm for 5 minutes. He was hemodynamically stable.  His symptoms during this VT was identicle to his presenting symptoms. ECG at 21:47 captured the VT.   Inpatient Medications  . aspirin EC  325 mg Oral Daily  . atorvastatin  40 mg Oral q1800  . heparin  5,000 Units Subcutaneous 3 times per day  . levothyroxine  137 mcg Oral QAC breakfast  . lisinopril  2.5 mg Oral Daily  . metoprolol tartrate  12.5 mg Oral BID  . multivitamin with minerals  1 tablet Oral QODAY  . pantoprazole  40 mg Oral Q1200  . sertraline  50 mg Oral Daily  . sodium chloride  3 mL Intravenous Q12H    . triamcinolone cream   Topical BID    Family History Family History  Problem Relation Age of Onset  . Heart disease Father   . Thyroid cancer Sister   . Thyroid cancer Sister   . Kidney Stones Sister      Social History History   Social History  . Marital Status: Married    Spouse Name: N/A  . Number of Children: N/A  . Years of Education: N/A   Occupational History  . Not on file.   Social History Main Topics  . Smoking status: Never Smoker   . Smokeless tobacco: Never Used  . Alcohol Use: No     Comment: Occasional  . Drug Use: No  . Sexual Activity: No   Other Topics Concern  . Not on file   Social History Narrative   Lives at home with walker.  Does not drive.  ESRD T, H, Sa.  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     Review of Systems  General:  No chills, fever, night sweats or weight changes.  Cardiovascular:  No chest  pain, dyspnea on exertion, edema, orthopnea, palpitations, paroxysmal nocturnal dyspnea. Dermatological: No rash, lesions/masses Respiratory: No cough, dyspnea Urologic: No hematuria, dysuria Abdominal:   No nausea, vomiting, diarrhea, bright red blood per rectum, melena, or hematemesis Neurologic:  No visual changes, wkns, changes in mental status. All other systems reviewed and are otherwise negative except as noted above.  Physical Exam  Blood pressure 108/71, pulse 64, temperature 98.8 F (37.1 C), temperature source Oral, resp. rate 18, height 6\' 1"  (1.854 m), weight 87.317 kg (192 lb 8 oz), SpO2 96 %.  General: Pleasant, NAD Psych: Normal affect. Neuro: Alert and oriented X 3. Moves all extremities spontaneously. HEENT: Normal  Neck: Supple without bruits or JVD. Lungs:  Resp regular and unlabored, CTA. Heart: RRR no s3, s4, or murmurs. Abdomen: Soft, non-tender, non-distended, BS + x 4.  Extremities: No clubbing, cyanosis or edema. DP/PT/Radials 2+ and equal bilaterally.  Labs  No results for input(s): CKTOTAL, CKMB, TROPONINI  in the last 72 hours. Lab Results  Component Value Date   WBC 6.5 12/29/2014   HGB 13.9 12/29/2014   HCT 41.1 12/29/2014   MCV 88.6 12/29/2014   PLT 146* 12/29/2014    Recent Labs Lab 12/29/14 1848  NA 139  K 3.8  CL 107  CO2 26  BUN 20  CREATININE 1.29*  CALCIUM 9.1  PROT 6.0*  BILITOT 0.7  ALKPHOS 46  ALT 24  AST 29  GLUCOSE 93   Lab Results  Component Value Date   CHOL 145 09/25/2013   HDL 27* 09/25/2013   LDLCALC 87 09/25/2013   TRIG 156* 09/25/2013   Lab Results  Component Value Date   DDIMER 11.85* 09/10/2013    Radiology/Studies  Dg Chest 2 View  12/29/2014   CLINICAL DATA:  Chest pain and shortness of breath  EXAM: CHEST  2 VIEW  COMPARISON:  March 26, 2014  FINDINGS: Patient is status post median sternotomy. There is no edema or consolidation. The heart size and pulmonary vascularity are normal. No adenopathy. No bone lesions.  IMPRESSION: No edema or consolidation.   Electronically Signed   By: Lowella Grip III M.D.   On: 12/29/2014 19:57    ECG  12/29/14 @ 18:34: 1st degree AV block. Possible inferior MI. IVCD. Unchanged compared to 01/15/14. 12/29/14 @ 21:47: MMVT  03/19/14 TTE - Left ventricle: The cavity size was normal. Wall thickness was increased in a pattern of moderate LVH. Systolic function was moderately reduced. The estimated ejection fraction was in the range of 35% to 40%. Doppler parameters are consistent with abnormal left ventricular relaxation (grade 1 diastolic dysfunction). - Aortic valve: Mildly calcified annulus. There was trivial regurgitation. Mean gradient (S): 14 mm Hg. Peak gradient (S): 26 mm Hg. - Left atrium: The atrium was mildly dilated.  ASSESSMENT AND PLAN  68M with aortic stenosis requiring aortic valvulotomy at age 7 and subsequent Bentall procedure in 02/2000 with placement of a pericardial tissue valve and redo Bentall procedure with homograft in setting of endocarditis (2015) who presents  with MMVT. This is likely scar mediated. He will meet criteria for secondary prevention ICD. Given history of intravascular infection, a S-ICD is a consideration. However, the MMVT is likely amenable to ATP and he does have evidence of conduction disease.  MMVT episodes are temporarily correlated with increasing synthroid.   1. Transfer to SDU 2. IV lidocaine bolus-->gtt; will avoid amiodarone given difficult to control thyroid profile 3. Increase metoprolol to 25mg  BID; hold for HR < 60,  SBP < 95 4. TTE 5. Check BNP and TSH 6. K>4, Mg>2  7. EP CS  Signed, Lamar Sprinkles, MD 12/30/2014, 1:39 AM

## 2014-12-30 NOTE — Consult Note (Signed)
ELECTROPHYSIOLOGY CONSULT NOTE    Patient ID: Bradley [name] MRN: [id number], DOB/AGE: 1956-04-12 59 y.o.  Admit date: 12/29/2014 Date of Consult: 12/30/2014  Primary Physician: [name], MD Primary Cardiologist: Harrington Challenger  Reason for Consultation: VT  HPI:  Bradley [name] is a 59 y.o. male with a complicated past medical history that includes thyroid cancer (s/p thyroidectomy), hypertension, depression, and aortic stenosis for which he underwent aortic vavulotomy at age 31 with subsequent Bentall procedure in 2001 with a pericardial tissue valve.    He was then admitted in 08/2013 with MSSA bacteremia and bacterial endocarditis with evidence of cerebral emboli.  That hospital course was complicated by renal failure and he was started on dialysis.  He currently has dialysis access in his left arm, but this has not been used.  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  He ultimately underwent redo Bentall with homograft and debridement of root abscess.  During surgery, he developed VT/VF requiring multiple shocks and was placed on amiodarone.  He continued to be dialyzed that admission and was discharged 10/2013.    Coronary evaluation includes a catheterization in 02/2010 with no CAD and cardiac CTA 09/2013 demonstrated no coronary disease.  Echocardiogram 02/2014 demonstrated EF [id number], grade 1 diastolic dysfunction, trivial AR.   For the past 2 weeks, he has been having intermittent periods of dizziness and pre-syncope associated with tachy palpitations and chest pain.  These episodes last up to 20 minutes. He has not had frank syncope.  He usually sits down and they subside.  The episodes have been more frequent in nature over the last few days and he called his PCP who recommended evaluation in the ER.  On telemetry in the ER, the patient developed VT at a rate of 161bpm that self terminated after 5  minutes.  He has been placed on Lidocaine and admitted to ICU. EP has been asked to evaluate.   He remains fairly active at home and has no functional limitations with activity.  He has had no chest pain, shortness of breath, or dizziness outside of spells with palpitations.  He denies recent fevers, chills, nausea or vomiting.   Past Medical History  Diagnosis Date  . Anxiety   . Depression   . Hypertension   . Hypothyroidism, postsurgical   . Aortic stenosis     s/p Bentall with bioprosthetic AVR 02/2010; Last echo (9/11): Moderate LVH, EF 45-50%, AVR functioning appropriately, aortic valve mean gradient 21, diastolic dysfunction. Chest MRA (2/13): Mild to moderate dilatation at the sinus of Valsalva at 4.1 cm, mild dilatation ascending aorta distal to the tube graft at 3.9 cm, moderate dilatation of the innominate artery a 2.1 cm;    . Hx of cardiac cath     a. LHC in 02/2010: normal cors  . Hx of echocardiogram 2014    Echo (10/14): Severe LVH, EF 60-65%, normal wall motion, grade 1 diastolic dysfunction, AVR functioning normally, mild aortic stenosis (mean 19), moderately dilated aorta, mild LAE, mild RVE  . Staphylococcus aureus bacteremia   . Prosthetic valve endocarditis   . Thyroid cancer   . ESRD on dialysis     a. 09/2013 felt to be related to gentamycin b. no longer requiring HD  . Hx of echocardiogram 02/2014    Echo (8/15):  Mod LV, EF 35-40%, Gr 1 DD, AVR ok (mean 14 mmHg), mild LAE     Surgical  History:  Past Surgical History  Procedure Laterality Date  . Cardiac catheterization  03/02/2010    NORMAL CORONARY ARTERY  . Sternotomy      REDO  . Transthoracic echocardiogram  03/2010    SHOWED MILD REDUCTION OF LV FUNCTION  . Thyroidectomy  ~ 2005  . Shoulder arthroscopy w/ rotator cuff repair Right 2012  . Av fistula placement Left 10/08/2013    Procedure: ARTERIOVENOUS (AV) FISTULA CREATION- LEFT ARM; Radial Cephalic ;  Surgeon: Mal [name], MD;  Location: [facility];   Service: Vascular;  Laterality: Left;  . Aortic valve replacement  2011  . Aortic valve repair  1968  . Tee without cardioversion N/A 10/24/2013    Procedure: TRANSESOPHAGEAL ECHOCARDIOGRAM (TEE);  Surgeon: Dorothy Spark, MD;  Location: [facility] ENDOSCOPY;  Service: Cardiovascular;  Laterality: N/A;  Deneen Harts procedure N/A 10/28/2013    Procedure: REDO BENTALL PROCEDURE, debridment of aoritc root abscess, replacement of aortic root, ascending aorta and aortic valve with homograft. Insertion of left femoral arterial line;  Surgeon: [name], MD;  Location: [facility];  Service: Open Heart Surgery;  Laterality: N/A;  . Intraoperative transesophageal echocardiogram N/A 10/28/2013    Procedure: INTRAOPERATIVE TRANSESOPHAGEAL ECHOCARDIOGRAM;  Surgeon: [name], MD;  Location: [facility];  Service: Open Heart Surgery;  Laterality: N/A;  . Cardiac valve replacement       Prescriptions prior to admission  Medication Sig Dispense Refill Last Dose  . aspirin EC 81 MG tablet Take 81 mg by mouth daily.    12/29/2014 at Unknown time  . betamethasone valerate ointment (VALISONE) 0.1 % Apply 1 application topically 2 (two) times daily. (Patient taking differently: Apply 1 application topically as needed (FOR FOREHEAD, NOSE AREA). ) 30 g 0 Past Week at Unknown time  . clonazePAM (KLONOPIN) 1 MG tablet TAKE ONE TABLET BY MOUTH TWICE DAILY AS NEEDED FOR ANXIETY 15 tablet 0 Past Week at Unknown time  . furosemide (LASIX) 20 MG tablet Take 1 tablet every Monday and Thursday (Patient taking differently: Take 20 mg by mouth 2 (two) times a week. Take 1 tablet every Monday and Thursday) 30 tablet 11 12/29/2014 at Unknown time  . levothyroxine (SYNTHROID, LEVOTHROID) 137 MCG tablet Take 1 tablet (137 mcg total) by mouth daily before breakfast. 90 tablet 0 12/29/2014 at Unknown time  . lisinopril (PRINIVIL,ZESTRIL) 2.5 MG tablet Take 1 tablet (2.5 mg total) by mouth daily. 90 tablet 3 12/29/2014 at Unknown time  .  metoprolol tartrate (LOPRESSOR) 25 MG tablet TAKE ONE-HALF TABLET BY MOUTH TWICE DAILY 30 tablet 5 12/29/2014 at 1700  . Multiple Vitamins-Minerals (CENTRUM SILVER ADULT 50+) TABS Take 1 tablet by mouth every other day.    12/29/2014 at Unknown time  . sertraline (ZOLOFT) 50 MG tablet TAKE ONE TABLET BY MOUTH ONCE DAILY 30 tablet 5 12/29/2014 at Unknown time    Inpatient Medications:  . aspirin EC  325 mg Oral Daily  . atorvastatin  40 mg Oral q1800  . heparin  5,000 Units Subcutaneous 3 times per day  . levothyroxine  137 mcg Oral QAC breakfast  . lisinopril  2.5 mg Oral Daily  . metoprolol tartrate  12.5 mg Oral BID  . multivitamin with minerals  1 tablet Oral QODAY  . pantoprazole  40 mg Oral Q1200  . sertraline  50 mg Oral Daily  . sodium chloride  3 mL Intravenous Q12H  . triamcinolone cream   Topical BID    Allergies:  Allergies  Allergen  Reactions  . Oxycodone Other (See Comments)    Gives patient nightmares  . Rifampin Nausea Only    History   Social History  . Marital Status: Married    Spouse Name: N/A  . Number of Children: N/A  . Years of Education: N/A   Occupational History  . Not on file.   Social History Main Topics  . Smoking status: Never Smoker   . Smokeless tobacco: Never Used  . Alcohol Use: No     Comment: Occasional  . Drug Use: No  . Sexual Activity: No   Other Topics Concern  . Not on file   Social History Narrative   Lives at home with walker.  Does not drive.  ESRD T, H, Sa.  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     Family History  Problem Relation Age of Onset  . Heart disease Father   . Thyroid cancer Sister   . Thyroid cancer Sister   . Kidney Stones Sister      Review of Systems: All other systems reviewed and are otherwise negative except as noted above.  Physical Exam: Filed Vitals:   12/30/14 0600 12/30/14 0700 12/30/14 0747 12/30/14 0800  BP: 118/68 119/72  119/77  Pulse: 59 58  63  Temp:   97.9 F (36.6 C)   TempSrc:    Oral   Resp: 15 14  13   Height:      Weight:      SpO2: 98% 96%  95%    GEN- The patient is well appearing, alert and oriented x 3 today.   HEENT: normocephalic, atraumatic; sclera clear, conjunctiva pink; hearing intact; oropharynx clear; neck supple, no JVP Lymph- no cervical lymphadenopathy Lungs- Clear to ausculation bilaterally, normal work of breathing.  No wheezes, rales, rhonchi Heart- Regular rate and rhythm, no murmurs, rubs or gallops  GI- soft, non-tender, non-distended, bowel sounds present  Extremities- no clubbing, cyanosis, or edema; DP/PT/radial pulses 2+ bilaterally, AV fistula LLE MS- no significant deformity or atrophy Skin- warm and dry, no rash or lesion Psych- euthymic mood, full affect Neuro- strength and sensation are intact  Labs:   Lab Results  Component Value Date   WBC 5.0 12/30/2014   HGB 13.2 12/30/2014   HCT 39.0 12/30/2014   MCV 88.4 12/30/2014   PLT 152 12/30/2014     Recent Labs Lab 12/29/14 1848 12/30/14 0452  NA 139 140  K 3.8 4.0  CL 107 106  CO2 26 24  BUN 20 16  CREATININE 1.29* 1.19  CALCIUM 9.1 8.6*  PROT 6.0*  --   BILITOT 0.7  --   ALKPHOS 46  --   ALT 24  --   AST 29  --   GLUCOSE 93 101*      Radiology/Studies: Dg Chest 2 View 12/29/2014   CLINICAL DATA:  Chest pain and shortness of breath  EXAM: CHEST  2 VIEW  COMPARISON:  March 26, 2014  FINDINGS: Patient is status post median sternotomy. There is no edema or consolidation. The heart size and pulmonary vascularity are normal. No adenopathy. No bone lesions.  IMPRESSION: No edema or consolidation.   Electronically Signed   By: Lowella Grip III M.D.   On: 12/29/2014 19:57    EKG:  12/29/14 21:47 LBBB, inferior axis ventricular tachycardia, rate 161 12/29/14 18:34 sinus rhythm, 1st degree AV block, poor R wave progression  TELEMETRY: sinus rhythm   Assessment/Plan: 1.  Sustained ventricular tachycardia The patient has symptomatic sustained ventricular  tachycardia. Last echo 02/2014 demonstrated EF 35-40%.  Repeat echo pending this admission.  He meets ICD criteria for secondary prevention.  With history of MSSA bacteremia, will ask ID to weigh in on thoughts about transvenous ICD system.  Will order blood cultures this morning. Would prefer to place transvenous device that has the capability of ATP therapy since the patient has monomorphic VT.  Will discontinue Lidocaine and start Sotalol Will change Metoprolol tartrate to Coreg Cardiac catheterization tomorrow for risk stratification (orders written) No driving x6 months (pt aware)  2.  Non ischemic cardiomyopathy Continue ACE-I, beta blocker Euvolemic on exam  3.  Inferior Wall MI by ECG evolution temporally related to endocarditis suggesting septic emboism as cause  3.  Hypothyroidism Improving Dr Elease Hashimoto is managing  Dr Caryl Comes to see today   Signed, Chanetta Marshall, NP 12/30/2014 8:40 AM  The patient is having recurrent symptoms with now documented sustained ventricular tachycardia in the context of a nonischemic cardiomyopathy related to congenital aortic valve disease which is further complicated by endocarditis and associated myocardial infarction.  Given the frequency of the events he will need antiarrhythmics suppression of his ventricular tachycardia and with his depressed left ventricular function would probably be considered for ICD implantation for secondary prevention. Given that he has had slow ventricular tachycardia antitachycardia pacing is an algorithm is an important concomitant of device therapy; hence, transvenous ICD is appropriate. He is relatively bradycardic and a dual-chamber device seems appropriate

## 2014-12-30 NOTE — Progress Notes (Signed)
PROGRESS NOTE    Bradley [name] [id number] DOB: 12/01/1955 DOA: 12/29/2014 PCP: [name], MD  HPI/Brief narrative 59 y.o. male with PMH of hypertension, history of thyroid cancer (post status of thyroidectomy), hypothyroidism, depression, anxiety, AVR, hx of aortic root abscess, chronic kidney disease-stage II, combined systolic and diastolic congestive heart failure, who presents with chest pain, palpitation and fainting episodes. Found to have monomorphic ventricular tachycardia. Cardiology consulted and patient admitted to stepdown unit and started on IV lidocaine drip. EPS consult at and plan for cardiac cath 6/2. ID consulted for input regarding placement of transvenous ICD     Assessment/Plan:  Chest pain:  - No pneumonia on chest x-ray. Less likely to have pulmonary embolism (chest is not pleuratic, oxygen saturation is 97% at room air, Well's score is low probability).  - Patient admitted to stepdown unit for close monitoring and management - increase aspirin dose from 81 to 325 mg daily - start lipitor  - Risk factor stratification: will check FLP (LDL 107) and A1C  - Minimal troponin elevation of unclear significance - EPS cardiology input appreciated and plan for cardiac cath 6/2  Sustained monomorphic ventricular tachycardia -Patient symptomatic of same - Echo 02/2014: EF 35-40 percent. Repeat echo pending. - EPS input appreciated and have requested ID to weigh in regarding transvenous ICD-given history of MSSA bacteremia. - Initially started on lidocaine drip which is being discontinued and starting sotalol - Cardiology have also changed metoprolol to carvedilol  - TSH elevated at 8 range but better compared to recent  Abdominal soreness:  - unclear etiology. His examination is benign. No acute abdomen - check lipase: Normal - start Protonix empirically for possible GERD - Improved  Hypothyroidism:  - Recent TSH was 12.03 on 12/07/14, his Synthroid  dose was adjusted recently. Currently is on 137 g daily. -Continue current dose of Synthroid  -check TSH: 8.262. Will not change Synthroid dose which was recently adjusted up area repeat TSH in 4-6 weeks  AVR- Bentall 2011, re-do March 2015 secondary to valve endocarditis:  - He now has a cadaver valve, he takes a baby aspirin daily. - continue ASA  Depression and generalized anxiety disorder:  - stable. No suicidal or homicidal ideations. - Continue clonidine and Zoloft  Chronic combined systolic and diastolic CHF:  - 2-D echo on 03/19/14 showed EF 35-40% with grade 1 diastolic dysfunction. CHF is compensated on admission. Patient does not have any leg edema. He is on Lasix 20 mg twice a week at home. - Hold Lasix  CKD (chronic kidney disease), stage II:  - Baseline creatinine 1.3. His creatinine is 1.29 on admission, which is at baseline.  - Stable  HTN: -lisinopril -metoprolol - Controlled   DVT prophylaxis: Subcutaneous heparin  Code Status: Full  Family Communication: None at bedside  Disposition Plan: Continue management in stepdown unit  Consultants:  Cardiology/EPS  Infectious disease  Procedures:  None  Antibiotics:  None   Subjective: No chest pain or palpitations. Had some abdominal discomfort on admission which is improved. Patient had a normal BM yesterday.   Objective: Filed Vitals:   12/30/14 0422 12/30/14 0500 12/30/14 0600 12/30/14 0700  BP: 93/30 119/77 118/68 119/72  Pulse:  56 59 58  Temp: 98.6 F (37 C)     TempSrc: Oral     Resp:  10 15 14   Height:      Weight:      SpO2:  97% 98% 96%    Intake/Output Summary (Last 24  hours) at 12/30/14 0724 Last data filed at 12/30/14 0700  Gross per 24 hour  Intake 850.34 ml  Output      0 ml  Net 850.34 ml   Filed Weights   12/29/14 2311 12/30/14 0400  Weight: 87.317 kg (192 lb 8 oz) 87.317 kg (192 lb 8 oz)     Exam:  General exam: Pleasant middle-aged male lying comfortably  supine in bed  Respiratory system: Clear. No increased work of breathing. Cardiovascular system: S1 & S2 heard, RRR. No JVD, murmurs, gallops, clicks or pedal edema.Telemetry: Sinus rhythm. Episode of NSVT on 5/31  Gastrointestinal system: Abdomen is nondistended, soft and nontender. Normal bowel sounds heard. Central nervous system: Alert and oriented. No focal neurological deficits. Extremities: Symmetric 5 x 5 power.   Data Reviewed: Basic Metabolic Panel:  Recent Labs Lab 12/29/14 1848 12/30/14 0049 12/30/14 0452  NA 139  --  140  K 3.8  --  4.0  CL 107  --  106  CO2 26  --  24  GLUCOSE 93  --  101*  BUN 20  --  16  CREATININE 1.29*  --  1.19  CALCIUM 9.1  --  8.6*  MG  --  2.1  --    Liver Function Tests:  Recent Labs Lab 12/29/14 1848  AST 29  ALT 24  ALKPHOS 46  BILITOT 0.7  PROT 6.0*  ALBUMIN 3.7    Recent Labs Lab 12/30/14 0049  LIPASE 24   No results for input(s): AMMONIA in the last 168 hours. CBC:  Recent Labs Lab 12/29/14 1848 12/30/14 0452  WBC 6.5 5.0  NEUTROABS 4.3  --   HGB 13.9 13.2  HCT 41.1 39.0  MCV 88.6 88.4  PLT 146* 152   Cardiac Enzymes:  Recent Labs Lab 12/30/14 0049 12/30/14 0452  TROPONINI 0.03 0.04*   BNP (last 3 results)  Recent Labs  01/06/14 1815  PROBNP 9238.0*   CBG: No results for input(s): GLUCAP in the last 168 hours.  Recent Results (from the past 240 hour(s))  MRSA PCR Screening     Status: None   Collection Time: 12/30/14  4:49 AM  Result Value Ref Range Status   MRSA by PCR NEGATIVE NEGATIVE Final    Comment:        The GeneXpert MRSA Assay (FDA approved for NASAL specimens only), is one component of a comprehensive MRSA colonization surveillance program. It is not intended to diagnose MRSA infection nor to guide or monitor treatment for MRSA infections.           Studies: Dg Chest 2 View  12/29/2014   CLINICAL DATA:  Chest pain and shortness of breath  EXAM: CHEST  2 VIEW   COMPARISON:  March 26, 2014  FINDINGS: Patient is status post median sternotomy. There is no edema or consolidation. The heart size and pulmonary vascularity are normal. No adenopathy. No bone lesions.  IMPRESSION: No edema or consolidation.   Electronically Signed   By: Lowella Grip III M.D.   On: 12/29/2014 19:57        Scheduled Meds: . aspirin EC  325 mg Oral Daily  . atorvastatin  40 mg Oral q1800  . heparin  5,000 Units Subcutaneous 3 times per day  . levothyroxine  137 mcg Oral QAC breakfast  . lisinopril  2.5 mg Oral Daily  . metoprolol tartrate  12.5 mg Oral BID  . multivitamin with minerals  1 tablet Oral QODAY  . pantoprazole  40 mg Oral Q1200  . sertraline  50 mg Oral Daily  . sodium chloride  3 mL Intravenous Q12H  . triamcinolone cream   Topical BID   Continuous Infusions: . sodium chloride 50 mL/hr at 12/30/14 0700  . lidocaine 1 mg/min (12/30/14 0700)    Principal Problem:   Chest pain Active Problems:   Hypothyroidism   AVR- Bentall 2011, re-do March 2015 secondary to valve endocarditis   Hypertension    Major depressive disorder, recurrent, severe with psychotic features- hospitalized in Jan 2013   Generalized anxiety disorder   Chronic combined systolic and diastolic CHF (congestive heart failure)   CKD (chronic kidney disease), stage II   Palpitations    Time spent: 25 minutes.    Vernell Leep, MD, FACP, FHM. [facility] Pager [phone]  If 7PM-7AM, please contact night-coverage [email or web address] Password [email or web address] 12/30/2014, 7:24 AM    LOS: 1 day

## 2014-12-30 NOTE — Consult Note (Signed)
[facility] Infectious Disease Progress Note  Date: 12/30/2014               Patient Name:  Bradley [name] MRN: [id number]  DOB: 12/13/1955 Age / Sex: 60 y.o., male   PCP: [name], MD         Requesting Physician: Dr. Modena Jansky, MD    Consulting Reason:  Hx of MSSA endocarditis 2015     Chief Complaint:  Chest pain  History of Present Illness:  Bradley [name] is a 59 year old male with PMH of CAD, systolic HF, AS s/p AV valve replacement c/b MSSA endocarditis and cerebral emboli in Feb 2015.  He was subsequently found to have perivalvular abscess and underwent redo Bentall procedure in March 2015 c/b ARF requiring HD. He had prolonged rehabilitation, eventual was able to stop hemodialysis, no further episodes of repeat MSSA infection. After his extensive prolonged hospitalization in 2015, he states that he is 80% of his baseline energy. Still has difficulty with remembering recent events. He has not returned back to work in [employer].  He reports episodes of lightheadedness and chest pain for the past two weeks.  The episodes were intermittent and of short duration but increased in intensity in recent days so he called his PCP who recommended ED evaluation.  He was found to be in VT, admitted for further management and is being evaluated by EP. No fever, chills, nightsweats Meds: Current Facility-Administered Medications  Medication Dose Route Frequency Provider Last Rate Last Dose  . 0.9 %  sodium chloride infusion   Intravenous Continuous Ivor Costa, MD 50 mL/hr at 12/30/14 0800    . acetaminophen (TYLENOL) tablet 650 mg  650 mg Oral Q6H PRN Ivor Costa, MD       Or  . acetaminophen (TYLENOL) suppository 650 mg  650 mg Rectal Q6H PRN Ivor Costa, MD      . alum & mag hydroxide-simeth (MAALOX/MYLANTA) 200-200-20 MG/5ML suspension 30 mL  30 mL Oral Q6H PRN Ivor Costa, MD      . aspirin EC tablet 325 mg  325 mg Oral Daily Ivor Costa, MD   325 mg at 12/30/14 0931  . atorvastatin  (LIPITOR) tablet 40 mg  40 mg Oral q1800 Ivor Costa, MD   40 mg at 12/30/14 0000  . clonazePAM (KLONOPIN) tablet 1 mg  1 mg Oral BID PRN Ivor Costa, MD      . heparin injection 5,000 Units  5,000 Units Subcutaneous 3 times per day Ivor Costa, MD   5,000 Units at 12/30/14 [phone]  . levothyroxine (SYNTHROID, LEVOTHROID) tablet 137 mcg  137 mcg Oral QAC breakfast Ivor Costa, MD   137 mcg at 12/30/14 0932  . lidocaine (cardiac) IV  infusion 4 mg/mL  1 mg/min Intravenous Continuous Lamar Sprinkles, MD 15 mL/hr at 12/30/14 0800 1 mg/min at 12/30/14 0800  . lisinopril (PRINIVIL,ZESTRIL) tablet 2.5 mg  2.5 mg Oral Daily Ivor Costa, MD   2.5 mg at 12/30/14 0937  . metoprolol tartrate (LOPRESSOR) tablet 12.5 mg  12.5 mg Oral BID Ivor Costa, MD   12.5 mg at 12/30/14 0931  . morphine 2 MG/ML injection 1 mg  1 mg Intravenous Q4H PRN Ivor Costa, MD      . multivitamin with minerals tablet 1 tablet  1 tablet Oral QODAY Ivor Costa, MD   1 tablet at 12/30/14 0931  . mupirocin ointment (BACTROBAN) 2 %   Nasal BID Carlyle Basques, MD      .  nitroGLYCERIN (NITROSTAT) SL tablet 0.4 mg  0.4 mg Sublingual Q5 min PRN Ivor Costa, MD      . pantoprazole (PROTONIX) EC tablet 40 mg  40 mg Oral Q1200 Ivor Costa, MD      . sertraline (ZOLOFT) tablet 50 mg  50 mg Oral Daily Ivor Costa, MD   50 mg at 12/30/14 0931  . sodium chloride 0.9 % injection 3 mL  3 mL Intravenous Q12H Ivor Costa, MD   3 mL at 12/30/14 0934  . triamcinolone cream (KENALOG) 0.1 %   Topical BID Ivor Costa, MD        Allergies: Allergies as of 12/29/2014 - Review Complete 12/29/2014  Allergen Reaction Noted  . Oxycodone Other (See Comments) 10/22/2013  . Rifampin Nausea Only 10/22/2013   Past Medical History  Diagnosis Date  . Anxiety   . Depression   . Hypertension   . Hypothyroidism, postsurgical   . Aortic stenosis     s/p Bentall with bioprosthetic AVR 02/2010; Last echo (9/11): Moderate LVH, EF 45-50%, AVR functioning appropriately, aortic valve mean gradient  21, diastolic dysfunction. Chest MRA (2/13): Mild to moderate dilatation at the sinus of Valsalva at 4.1 cm, mild dilatation ascending aorta distal to the tube graft at 3.9 cm, moderate dilatation of the innominate artery a 2.1 cm;    . Hx of cardiac cath     a. LHC in 02/2010: normal cors  . Hx of echocardiogram 2014    Echo (10/14): Severe LVH, EF 60-65%, normal wall motion, grade 1 diastolic dysfunction, AVR functioning normally, mild aortic stenosis (mean 19), moderately dilated aorta, mild LAE, mild RVE  . Staphylococcus aureus bacteremia   . Prosthetic valve endocarditis   . Thyroid cancer   . ESRD on dialysis     a. 09/2013 felt to be related to gentamycin b. no longer requiring HD  . Hx of echocardiogram 02/2014    Echo (8/15):  Mod LV, EF 35-40%, Gr 1 DD, AVR ok (mean 14 mmHg), mild LAE   Past Surgical History  Procedure Laterality Date  . Cardiac catheterization  03/02/2010    NORMAL CORONARY ARTERY  . Sternotomy      REDO  . Transthoracic echocardiogram  03/2010    SHOWED MILD REDUCTION OF LV FUNCTION  . Thyroidectomy  ~ 2005  . Shoulder arthroscopy w/ rotator cuff repair Right 2012  . Av fistula placement Left 10/08/2013    Procedure: ARTERIOVENOUS (AV) FISTULA CREATION- LEFT ARM; Radial Cephalic ;  Surgeon: Mal [name], MD;  Location: [place];  Service: Vascular;  Laterality: Left;  . Aortic valve replacement  2011  . Aortic valve repair  1968  . Tee without cardioversion N/A 10/24/2013    Procedure: TRANSESOPHAGEAL ECHOCARDIOGRAM (TEE);  Surgeon: Dorothy Spark, MD;  [facility] ENDOSCOPY;  Service: Cardiovascular;  Laterality: N/A;  Deneen Harts procedure N/A 10/28/2013    Procedure: REDO BENTALL PROCEDURE, debridment of aoritc root abscess, replacement of aortic root, ascending aorta and aortic valve with homograft. Insertion of left femoral arterial line;  Surgeon: [name], MD;  Location: [place];  Service: Open Heart Surgery;  Laterality: N/A;  . Intraoperative  transesophageal echocardiogram N/A 10/28/2013    Procedure: INTRAOPERATIVE TRANSESOPHAGEAL ECHOCARDIOGRAM;  Surgeon: [name], MD;  Location: [place];  Service: Open Heart Surgery;  Laterality: N/A;  . Cardiac valve replacement     Family History  Problem Relation Age of Onset  . Heart disease Father   . Thyroid cancer  Sister   . Thyroid cancer Sister   . Kidney Stones Sister    History   Social History  . Marital Status: Married    Spouse Name: N/A  . Number of Children: N/A  . Years of Education: N/A   Occupational History  . Not on file.   Social History Main Topics  . Smoking status: Never Smoker   . Smokeless tobacco: Never Used  . Alcohol Use: No     Comment: Occasional  . Drug Use: No  . Sexual Activity: No   Other Topics Concern  . Not on file   Social History Narrative   Lives at home with walker.  Does not drive.  ESRD T, H, Sa.  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    Review of Systems: General:  + fatigue; denies for fever/chills, wt loss or decreased appetite HEENT:  + dysphagia; Denies change in vision or hearing Cardiac:  Per HPI Pulm:  Per HPI GI:  + abdominal pain; denies N/V, diarrhea or constipation, dark or bloody stool GU:  Denies difficulty urinating, dysuria or hematuria Neuro:  + short term memory loss (since last year's admission), denies numbness/tingling or unilateral weakness  Physical Exam: Blood pressure 117/82, pulse 65, temperature 97.9 F (36.6 C), temperature source Oral, resp. rate 13, height 6\' 1"  (1.854 m), weight 192 lb 8 oz (87.317 kg), SpO2 95 %.   General: sitting up in bed in NAD; non-toxic appearing HEENT: /AT Cardiac: RRR, + 2/6 systolic murmur, no rubs or gallops Pulm: clear to auscultation bilaterally, moving normal volumes of air Abd: soft, nondistended, BS present, mild diffuse TTP Ext: warm and well perfused, no pedal edema,  Skin =?splinter hemorrhage on right second finger nail Neuro: alert and oriented X3,  responding appropriately, MAE spontaneously  Lab results: Basic Metabolic Panel:  Recent Labs  12/29/14 1848 12/30/14 0049 12/30/14 0452  NA 139  --  140  K 3.8  --  4.0  CL 107  --  106  CO2 26  --  24  GLUCOSE 93  --  101*  BUN 20  --  16  CREATININE 1.29*  --  1.19  CALCIUM 9.1  --  8.6*  MG  --  2.1  --    Liver Function Tests:  Recent Labs  12/29/14 1848  AST 29  ALT 24  ALKPHOS 46  BILITOT 0.7  PROT 6.0*  ALBUMIN 3.7    Recent Labs  12/30/14 0049  LIPASE 24   CBC:  Recent Labs  12/29/14 1848 12/30/14 0452  WBC 6.5 5.0  NEUTROABS 4.3  --   HGB 13.9 13.2  HCT 41.1 39.0  MCV 88.6 88.4  PLT 146* 152   Cardiac Enzymes:  Recent Labs  12/30/14 0049 12/30/14 0452  TROPONINI 0.03 0.04*   CBG:  Recent Labs  12/30/14 0745  GLUCAP 79   Fasting Lipid Panel:  Recent Labs  12/30/14 0452  CHOL 168  HDL 43  LDLCALC 107*  TRIG 89  CHOLHDL 3.9   Thyroid Function Tests:  Recent Labs  12/30/14 0452  TSH 8.262*   Coagulation:  Recent Labs  12/30/14 0049  LABPROT 14.5  INR 1.12   Urine Drug Screen: Drugs of Abuse     Component Value Date/Time   LABOPIA NONE DETECTED 09/06/2013 2237   COCAINSCRNUR NONE DETECTED 09/06/2013 2237   LABBENZ NONE DETECTED 09/06/2013 2237   AMPHETMU NONE DETECTED 09/06/2013 2237   THCU POSITIVE* 09/06/2013 2237   LABBARB NONE  DETECTED 09/06/2013 2237    Imaging results:  Dg Chest 2 View  12/29/2014   CLINICAL DATA:  Chest pain and shortness of breath  EXAM: CHEST  2 VIEW  COMPARISON:  March 26, 2014  FINDINGS: Patient is status post median sternotomy. There is no edema or consolidation. The heart size and pulmonary vascularity are normal. No adenopathy. No bone lesions.  IMPRESSION: No edema or consolidation.   Electronically Signed   By: Lowella Grip III M.D.   On: 12/29/2014 19:57    Assessment, Plan, & Recommendations by Problem: AVR- Bentall 2011, re-do March 2015 secondary to MSSA valve  endocarditis:  No objective findings for endocarditis presently or recurrent infection at this time.  No indication for abx.  Cardiology/EP evaluation underway.  Given his hx of having MSSA infection, he is likely still colonized. Colonized patients do have risk of having invasive infection with elective surgeries as noted for joint replacement. This risk is minimized if patients are decolonized prior to upcoming surgery. If we extrapolate this information for ICD placement, I believe that his risk of MSSA would decrease if we decolonize with: - hibiclens bath daily x 3 d - nasal mupirocen BID x 3 d - monitor for S&S of infection - if he needs urgent ICD placement, would recommend to use antiseptic, intranasal povo-iodine that is formulary  Sustained VTach: Being evaluated by EP for ICD, catheterization planned for tomorrow  Fatigue = could be due to hypothyroidsim. he has elevated TSH and low end of normal range of FT4. Would recommend outpatient follow up with endocrine for further management  Signed: Francesca Oman, DO IMTS PGY2 on ID Consult Service 12/30/2014, 10:26 AM   I have interviewed, examined, and discussed/revised the plan as outlined above by Dr. Redmond Pulling.   Elzie Rings [place] [facility] [phone]

## 2014-12-30 NOTE — Care Management Note (Signed)
Case Management Note  Patient Details  Name: MAISYN ABRUZZESE MRN: [id number] Date of Birth: October 11, 1955  Subjective/Objective:   59yo Male admitted with Palpitations and Near syncope. From home with spouse.                  Action/Plan: Will follow to see if [place] needed as chart reflects HHRN pta.   Expected Discharge Date:                  Expected Discharge Plan:  [place] (Chart reflects from home with Spouse,, Receiving RN through [facility])  In-House Referral:     Discharge planning Services  CM Consult  Post Acute Care Choice:    Choice offered to:     DME Arranged:    DME Agency:     HH Arranged:    HH Agency:     Status of Service:  In process, will continue to follow (Will verify if [facility] to be resumed at discharge)  Medicare Important Message Given:    Date Medicare IM Given:    Medicare IM give by:    Date Additional Medicare IM Given:    Additional Medicare Important Message give by:     If discussed at [place] of Stay Meetings, dates discussed:    Additional Comments:  Delrae Sawyers, RN 12/30/2014, 10:47 AM

## 2014-12-30 NOTE — Progress Notes (Signed)
Echocardiogram 2D Echocardiogram with Definity has been performed.  Bradley [name] 12/30/2014, 1:49 PM

## 2014-12-31 DIAGNOSIS — R079 Chest pain, unspecified: Secondary | ICD-10-CM

## 2014-12-31 DIAGNOSIS — Z22321 Carrier or suspected carrier of Methicillin susceptible Staphylococcus aureus: Secondary | ICD-10-CM | POA: Insufficient documentation

## 2014-12-31 DIAGNOSIS — I252 Old myocardial infarction: Secondary | ICD-10-CM

## 2014-12-31 DIAGNOSIS — R5383 Other fatigue: Secondary | ICD-10-CM

## 2014-12-31 LAB — T4, FREE: Free T4: 0.98 ng/dL (ref 0.61–1.12)

## 2014-12-31 LAB — GLUCOSE, CAPILLARY: Glucose-Capillary: 99 mg/dL (ref 65–99)

## 2014-12-31 LAB — HEMOGLOBIN A1C
Hgb A1c MFr Bld: 5.9 % — ABNORMAL HIGH (ref 4.8–5.6)
Mean Plasma Glucose: 123 mg/dL

## 2014-12-31 MED ORDER — SODIUM CHLORIDE 0.9 % IJ SOLN: 3.0000 mL | INTRAMUSCULAR | Status: DC | PRN

## 2014-12-31 MED ORDER — SODIUM CHLORIDE 0.9 % WEIGHT BASED INFUSION: 1.0000 mL/kg/h | INTRAVENOUS | Status: DC

## 2014-12-31 MED ORDER — SODIUM CHLORIDE 0.9 % IV SOLN: 250.0000 mL | INTRAVENOUS | Status: DC | PRN

## 2014-12-31 MED ORDER — SODIUM CHLORIDE 0.9 % WEIGHT BASED INFUSION
3.0000 mL/kg/h | INTRAVENOUS | Status: DC
  Administered 2014-12-31: 3 mL/kg/h via INTRAVENOUS

## 2014-12-31 MED ORDER — SODIUM CHLORIDE 0.9 % IJ SOLN
3.0000 mL | Freq: Two times a day (BID) | INTRAMUSCULAR | Status: DC
  Administered 2014-12-31: 3 mL via INTRAVENOUS

## 2014-12-31 MED ORDER — CEFAZOLIN SODIUM-DEXTROSE 2-3 GM-% IV SOLR
2.0000 g | INTRAVENOUS | Status: DC
  Filled 2014-12-31: qty 50

## 2014-12-31 MED ORDER — CHLORHEXIDINE GLUCONATE 4 % EX LIQD
60.0000 mL | Freq: Once | CUTANEOUS | Status: DC
  Filled 2014-12-31: qty 60

## 2014-12-31 MED ORDER — ASPIRIN 81 MG PO CHEW
81.0000 mg | CHEWABLE_TABLET | ORAL | Status: AC
  Administered 2014-12-31: 81 mg via ORAL
  Filled 2014-12-31: qty 1

## 2014-12-31 MED ORDER — SODIUM CHLORIDE 0.9 % IJ SOLN: 3.0000 mL | Freq: Two times a day (BID) | INTRAMUSCULAR | Status: DC

## 2014-12-31 MED ORDER — SODIUM CHLORIDE 0.9 % IR SOLN: 80.0000 mg | Status: DC

## 2014-12-31 MED ORDER — CARVEDILOL 6.25 MG PO TABS
6.2500 mg | ORAL_TABLET | Freq: Two times a day (BID) | ORAL | Status: DC
  Administered 2015-01-01 – 2015-01-02 (×2): 6.25 mg via ORAL
  Filled 2014-12-31 (×7): qty 1

## 2014-12-31 MED ORDER — SODIUM CHLORIDE 0.9 % WEIGHT BASED INFUSION: 3.0000 mL/kg/h | INTRAVENOUS | Status: DC

## 2014-12-31 MED ORDER — CEFAZOLIN SODIUM-DEXTROSE 2-3 GM-% IV SOLR: 2.0000 g | INTRAVENOUS | Status: DC

## 2014-12-31 MED ORDER — CHLORHEXIDINE GLUCONATE 4 % EX LIQD
Freq: Every day | CUTANEOUS | Status: DC
  Filled 2014-12-31 (×3): qty 15

## 2014-12-31 MED ORDER — GENTAMICIN SULFATE 40 MG/ML IJ SOLN
80.0000 mg | INTRAMUSCULAR | Status: DC
  Filled 2014-12-31: qty 2

## 2014-12-31 MED ORDER — ASPIRIN EC 81 MG PO TBEC
81.0000 mg | DELAYED_RELEASE_TABLET | Freq: Every day | ORAL | Status: DC
  Administered 2015-01-02: 81 mg via ORAL
  Filled 2014-12-31 (×3): qty 1

## 2014-12-31 MED ORDER — SODIUM CHLORIDE 0.9 % IV SOLN: 250.0000 mL | INTRAVENOUS | Status: DC

## 2014-12-31 MED ORDER — SODIUM CHLORIDE 0.9 % IV SOLN
INTRAVENOUS | Status: DC
  Administered 2015-01-01: 50 mL via INTRAVENOUS

## 2014-12-31 MED ORDER — MEXILETINE HCL 150 MG PO CAPS
150.0000 mg | ORAL_CAPSULE | Freq: Two times a day (BID) | ORAL | Status: DC
  Administered 2014-12-31 – 2015-01-02 (×4): 150 mg via ORAL
  Filled 2014-12-31 (×7): qty 1

## 2014-12-31 MED ORDER — SODIUM CHLORIDE 0.9 % IV SOLN
INTRAVENOUS | Status: DC
  Administered 2015-01-02: 07:00:00 via INTRAVENOUS

## 2014-12-31 MED ORDER — SODIUM CHLORIDE 0.9 % IV SOLN: INTRAVENOUS | Status: DC

## 2014-12-31 MED ORDER — CHLORHEXIDINE GLUCONATE 4 % EX LIQD
60.0000 mL | Freq: Once | CUTANEOUS | Status: AC
  Administered 2015-01-01: 4 via TOPICAL
  Filled 2014-12-31: qty 60

## 2014-12-31 NOTE — Progress Notes (Signed)
PROGRESS NOTE    Bradley [name] [id number] DOB: 1956-06-05 DOA: 12/29/2014 PCP: [name], MD  HPI/Brief narrative 59 y.o. male with PMH of hypertension, history of thyroid cancer (post status of thyroidectomy), hypothyroidism, depression, anxiety, AVR, hx of aortic root abscess, chronic kidney disease-stage II, combined systolic and diastolic congestive heart failure, who presents with chest pain, palpitation and fainting episodes. Found to have monomorphic ventricular tachycardia. Cardiology consulted and patient admitted to stepdown unit and started on IV lidocaine drip. EPS consult at and plan for cardiac cath 6/2. ID consulted for input regarding placement of transvenous ICD   Assessment/Plan:  Chest pain  - No pneumonia on chest x-ray. Less likely to have pulmonary embolism (chest is not pleuratic, oxygen saturation is 97% at room air, Well's score is low probability).  - Patient admitted to stepdown unit for close monitoring and management - started lipitor  - Risk factor stratification: will check FLP (LDL 107) and A1C 5.9 - Minimal troponin elevation of unclear significance - EPS cardiology input appreciated and plan for cardiac cath 6/2 & ICD 6/3  Sustained monomorphic ventricular tachycardia -Patient symptomatic of same - Echo 02/2014: EF 35-40 percent. Repeat echo : LVEF 30% to 35%. Akinesis of the midanteroseptal myocardium.. - EPS input appreciated and requested ID to weigh in regarding transvenous ICD-given history of MSSA bacteremia. (As per ID recommendation: Patient likely has MSSA colonization and hence has increased risk of having invasive infection and this risk is minimized if patients are decolonized for 3 days with Hibiclens bath and nasal mupirocin) - Initially started on lidocaine drip which was discontinued. - Cardiology have also changed metoprolol to carvedilol  - TSH elevated at 8 range but better compared to recent  Cardiomyopathy - For  evaluation by cath 6/2 - Continue lisinopril. Titrate up as blood pressure and renal functions allow. - Continue carvedilol  Abdominal soreness:  - unclear etiology. His examination is benign. No acute abdomen - check lipase: Normal - start Protonix empirically for possible GERD - Seems to have resolved.  Hypothyroidism:  - Recent TSH was 12.03 on 12/07/14, his Synthroid dose was adjusted recently. Currently is on 137 g daily. -Continue current dose of Synthroid  -check TSH: 8.262. Will not change Synthroid dose which was recently adjusted up area repeat TSH in 4-6 weeks  AVR- Bentall 2011, re-do March 2015 secondary to valve endocarditis:  - He now has a cadaver valve, he takes a baby aspirin daily. - continue ASA  Depression and generalized anxiety disorder:  - stable. No suicidal or homicidal ideations. - Continue clonidine and Zoloft  Chronic combined systolic and diastolic CHF:  - 2-D echo on 03/19/14 showed EF 35-40% with grade 1 diastolic dysfunction. CHF is compensated on admission. Patient does not have any leg edema. He is on Lasix 20 mg twice a week at home. - Hold Lasix  CKD (chronic kidney disease), stage II:  - Baseline creatinine 1.3. His creatinine is 1.29 on admission, which is at baseline.  - Stable  HTN: -Continue lisinopril and carvedilol   DVT prophylaxis: Subcutaneous heparin  Code Status: Full  Family Communication: None at bedside  Disposition Plan: Continue management in stepdown unit  Consultants:  Cardiology/EPS  Infectious disease  Procedures:  2 D Echo 6/1: Study Conclusions  - Left ventricle: The cavity size was moderately dilated. Wall thickness was increased in a pattern of mild LVH. Systolic function was moderately to severely reduced. The estimated ejection fraction was in the range of 30% to  35%. Akinesis of the midanteroseptal myocardium. - Aortic valve: There was mild regurgitation. Valve area (VTI): 2.37 cm^2.  Valve area (Vmax): 2.53 cm^2. Valve area (Vmean): 2.47 cm^2. - Mitral valve: There was mild regurgitation. - Left atrium: The atrium was moderately dilated. - Right ventricle: The cavity size was mildly dilated. - Right atrium: The atrium was mildly dilated.  Antibiotics:  None   Subjective: Patient seen this morning. Denies chest pain or abdominal pain.   Objective: Filed Vitals:   12/31/14 0700 12/31/14 0800 12/31/14 0900 12/31/14 0941  BP: 107/67 108/76 115/70 114/83  Pulse: 63 61 59 58  Temp:  97.9 F (36.6 C)    TempSrc:  Oral    Resp: 19 13 16    Height:      Weight:      SpO2: 99% 96% 96%     Intake/Output Summary (Last 24 hours) at 12/31/14 1147 Last data filed at 12/31/14 1000  Gross per 24 hour  Intake 745.02 ml  Output   2075 ml  Net -1329.98 ml   Filed Weights   12/29/14 2311 12/30/14 0400 12/31/14 0500  Weight: 87.317 kg (192 lb 8 oz) 87.317 kg (192 lb 8 oz) 87.317 kg (192 lb 8 oz)     Exam:  General exam: Pleasant middle-aged male lying comfortably supine in bed  Respiratory system: Clear. No increased work of breathing. Cardiovascular system: S1 & S2 heard, RRR. No JVD, murmurs, gallops, clicks or pedal edema.Telemetry: Sinus rhythm in the 60s with BBB morphology. No more VTs since yesterday Gastrointestinal system: Abdomen is nondistended, soft and nontender. Normal bowel sounds heard. Central nervous system: Alert and oriented. No focal neurological deficits. Extremities: Symmetric 5 x 5 power.   Data Reviewed: Basic Metabolic Panel:  Recent Labs Lab 12/29/14 1848 12/30/14 0049 12/30/14 0452  NA 139  --  140  K 3.8  --  4.0  CL 107  --  106  CO2 26  --  24  GLUCOSE 93  --  101*  BUN 20  --  16  CREATININE 1.29*  --  1.19  CALCIUM 9.1  --  8.6*  MG  --  2.1  --    Liver Function Tests:  Recent Labs Lab 12/29/14 1848  AST 29  ALT 24  ALKPHOS 46  BILITOT 0.7  PROT 6.0*  ALBUMIN 3.7    Recent Labs Lab 12/30/14 0049    LIPASE 24   No results for input(s): AMMONIA in the last 168 hours. CBC:  Recent Labs Lab 12/29/14 1848 12/30/14 0452  WBC 6.5 5.0  NEUTROABS 4.3  --   HGB 13.9 13.2  HCT 41.1 39.0  MCV 88.6 88.4  PLT 146* 152   Cardiac Enzymes:  Recent Labs Lab 12/30/14 0049 12/30/14 0452 12/30/14 1106  TROPONINI 0.03 0.04* <0.03   BNP (last 3 results)  Recent Labs  01/06/14 1815  PROBNP 9238.0*   CBG:  Recent Labs Lab 12/30/14 0745 12/31/14 0837  GLUCAP 79 99    Recent Results (from the past 240 hour(s))  MRSA PCR Screening     Status: None   Collection Time: 12/30/14  4:49 AM  Result Value Ref Range Status   MRSA by PCR NEGATIVE NEGATIVE Final    Comment:        The GeneXpert MRSA Assay (FDA approved for NASAL specimens only), is one component of a comprehensive MRSA colonization surveillance program. It is not intended to diagnose MRSA infection nor to guide or monitor treatment  for MRSA infections.   Culture, blood (routine x 2)     Status: None (Preliminary result)   Collection Time: 12/30/14  8:50 AM  Result Value Ref Range Status   Specimen Description BLOOD RIGHT HAND  Final   Special Requests BOTTLES DRAWN AEROBIC AND ANAEROBIC 6CC  Final   Culture   Final           BLOOD CULTURE RECEIVED NO GROWTH TO DATE CULTURE WILL BE HELD FOR 5 DAYS BEFORE ISSUING A FINAL NEGATIVE REPORT Performed at [facility]    Report Status PENDING  Incomplete  Culture, blood (routine x 2)     Status: None (Preliminary result)   Collection Time: 12/30/14  8:56 AM  Result Value Ref Range Status   Specimen Description BLOOD RIGHT ARM  Final   Special Requests BOTTLES DRAWN AEROBIC ONLY 10CC  Final   Culture   Final           BLOOD CULTURE RECEIVED NO GROWTH TO DATE CULTURE WILL BE HELD FOR 5 DAYS BEFORE ISSUING A FINAL NEGATIVE REPORT Performed at [facility]    Report Status PENDING  Incomplete          Studies: Dg Chest 2 View  12/29/2014    CLINICAL DATA:  Chest pain and shortness of breath  EXAM: CHEST  2 VIEW  COMPARISON:  March 26, 2014  FINDINGS: Patient is status post median sternotomy. There is no edema or consolidation. The heart size and pulmonary vascularity are normal. No adenopathy. No bone lesions.  IMPRESSION: No edema or consolidation.   Electronically Signed   By: Lowella Grip III M.D.   On: 12/29/2014 19:57        Scheduled Meds: . aspirin EC  81 mg Oral Daily  . atorvastatin  40 mg Oral q1800  . carvedilol  6.25 mg Oral BID WC  . heparin  5,000 Units Subcutaneous 3 times per day  . levothyroxine  137 mcg Oral QAC breakfast  . lisinopril  2.5 mg Oral Daily  . mexiletine  150 mg Oral Q12H  . multivitamin with minerals  1 tablet Oral QODAY  . mupirocin ointment   Nasal BID  . pantoprazole  40 mg Oral Q1200  . sertraline  50 mg Oral Daily  . sodium chloride  3 mL Intravenous Q12H  . sodium chloride  3 mL Intravenous Q12H  . sodium chloride  3 mL Intravenous Q12H  . triamcinolone cream   Topical BID   Continuous Infusions: . [START ON 01/01/2015] sodium chloride     Followed by  . [START ON 01/01/2015] sodium chloride    . sodium chloride 1 mL/kg/hr (12/31/14 0900)    Active Problems:   Hypothyroidism   AVR- Bentall 2011, re-do March 2015 secondary to valve endocarditis   Hypertension    Major depressive disorder, recurrent, severe with psychotic features- hospitalized in Jan 2013   Chronic combined systolic and diastolic CHF (congestive heart failure)   CKD (chronic kidney disease), stage II   Paroxysmal ventricular tachycardia   History of acute inferior wall MI    Time spent: 25 minutes.    Vernell Leep, MD, FACP, FHM. [facility] Pager [phone]  If 7PM-7AM, please contact night-coverage [email or web address] Password [email or web address] 12/31/2014, 11:47 AM    LOS: 2 days

## 2014-12-31 NOTE — Progress Notes (Signed)
Spoke with staff member from cath lab regarding their request to have consent form signed for A-ICD procedure. Informed them, that Bradley [name] and Bradley [name] have questions regarding the procedure. They told me that they have not had the procedure explained to them other than when it has been mentioned in passing. Passed all this information on to cath lab staff. I was told the information would be passed on to the cath lab manager. Consent form filled out, unsigned by patient on chart. Will have patient sign consent after questions are answered and he wishes to proceed with procedure. ICD patient education video watched by Bradley [name] and Bradley [name]. Bradley Blade RN

## 2014-12-31 NOTE — Progress Notes (Signed)
Patient Name: Bradley [name]      SUBJECTIVE without chest pain or SOB  Echo 30-35% 6/16  Past Medical History  Diagnosis Date  . Anxiety   . Depression   . Hypertension   . Hypothyroidism, postsurgical   . Aortic stenosis     s/p Bentall with bioprosthetic AVR 02/2010; Last echo (9/11): Moderate LVH, EF 45-50%, AVR functioning appropriately, aortic valve mean gradient 21, diastolic dysfunction. Chest MRA (2/13): Mild to moderate dilatation at the sinus of Valsalva at 4.1 cm, mild dilatation ascending aorta distal to the tube graft at 3.9 cm, moderate dilatation of the innominate artery a 2.1 cm;    . Hx of cardiac cath     a. LHC in 02/2010: normal cors  . Hx of echocardiogram 2014    Echo (10/14): Severe LVH, EF 60-65%, normal wall motion, grade 1 diastolic dysfunction, AVR functioning normally, mild aortic stenosis (mean 19), moderately dilated aorta, mild LAE, mild RVE  . Staphylococcus aureus bacteremia   . Prosthetic valve endocarditis   . Thyroid cancer   . ESRD on dialysis     a. 09/2013 felt to be related to gentamycin b. no longer requiring HD  . Hx of echocardiogram 02/2014    Echo (8/15):  Mod LV, EF 35-40%, Gr 1 DD, AVR ok (mean 14 mmHg), mild LAE    Scheduled Meds:  Scheduled Meds: . aspirin  81 mg Oral Pre-Cath  . aspirin EC  325 mg Oral Daily  . atorvastatin  40 mg Oral q1800  . heparin  5,000 Units Subcutaneous 3 times per day  . levothyroxine  137 mcg Oral QAC breakfast  . lisinopril  2.5 mg Oral Daily  . metoprolol tartrate  12.5 mg Oral BID  . multivitamin with minerals  1 tablet Oral QODAY  . mupirocin ointment   Nasal BID  . pantoprazole  40 mg Oral Q1200  . sertraline  50 mg Oral Daily  . sodium chloride  3 mL Intravenous Q12H  . sodium chloride  3 mL Intravenous Q12H  . sodium chloride  3 mL Intravenous Q12H  . triamcinolone cream   Topical BID   Continuous Infusions: . [START ON 01/01/2015] sodium chloride     Followed by  . [START  ON 01/01/2015] sodium chloride    . sodium chloride     Followed by  . sodium chloride    . lidocaine 50 mg (12/30/14 2000)   sodium chloride, sodium chloride, acetaminophen **OR** acetaminophen, alum & mag hydroxide-simeth, clonazePAM, morphine injection, nitroGLYCERIN, sodium chloride, sodium chloride    PHYSICAL EXAM Filed Vitals:   12/31/14 0413 12/31/14 0500 12/31/14 0600 12/31/14 0700  BP:  123/69 100/65 107/67  Pulse:  64 59 63  Temp: 97.7 F (36.5 C)     TempSrc: Oral     Resp:  18 16 19   Height:      Weight:  87.317 kg (192 lb 8 oz)    SpO2:  97% 96% 99%    Well developed and nourished in no acute distress HENT normal Neck supple with JVP-flat Clear Regular rate and rhythm, 2/6 murmur  Abd-soft with active BS No Clubbing cyanosis edema Skin-warm and dry A & Oriented  Grossly normal sensory and motor function  TELEMETRY: Reviewed telemetry pt in  NSR:    Intake/Output Summary (Last 24 hours) at 12/31/14 0737 Last data filed at 12/31/14 0700  Gross per 24 hour  Intake    910 ml  Output   1725 ml  Net   -815 ml    LABS: Basic Metabolic Panel:  Recent Labs Lab 12/29/14 1848 12/30/14 0049 12/30/14 0452  NA 139  --  140  K 3.8  --  4.0  CL 107  --  106  CO2 26  --  24  GLUCOSE 93  --  101*  BUN 20  --  16  CREATININE 1.29*  --  1.19  CALCIUM 9.1  --  8.6*  MG  --  2.1  --    Cardiac Enzymes:  Recent Labs  12/30/14 0049 12/30/14 0452 12/30/14 1106  TROPONINI 0.03 0.04* <0.03   CBC:  Recent Labs Lab 12/29/14 1848 12/30/14 0452  WBC 6.5 5.0  NEUTROABS 4.3  --   HGB 13.9 13.2  HCT 41.1 39.0  MCV 88.6 88.4  PLT 146* 152   PROTIME:  Recent Labs  12/30/14 0049  LABPROT 14.5  INR 1.12   Liver Function Tests:  Recent Labs  12/29/14 1848  AST 29  ALT 24  ALKPHOS 46  BILITOT 0.7  PROT 6.0*  ALBUMIN 3.7    Recent Labs  12/30/14 0049  LIPASE 24   BNP: BNP (last 3 results)  Recent Labs  12/30/14 0452  BNP 550.5*      ProBNP (last 3 results)  Recent Labs  01/06/14 1815  PROBNP 9238.0*    D-Dimer: No results for input(s): DDIMER in the last 72 hours. Hemoglobin A1C: No results for input(s): HGBA1C in the last 72 hours. Fasting Lipid Panel:  Recent Labs  12/30/14 0452  CHOL 168  HDL 43  LDLCALC 107*  TRIG 89  CHOLHDL 3.9   Thyroid Function Tests:  Recent Labs  12/30/14 0452  TSH 8.262*      ASSESSMENT AND PLAN:  Active Problems:   Hypothyroidism   AVR- Bentall 2011, re-do March 2015 secondary to valve endocarditis   Hypertension    Major depressive disorder, recurrent, severe with psychotic features- hospitalized in Jan 2013   Chronic combined systolic and diastolic CHF (congestive heart failure)   CKD (chronic kidney disease), stage II   Paroxysmal ventricular tachycardia   History of acute inferior wall MI  For Cath today ICD anticipated in am With recurrent VT will need adjunctive antiarrhythmic therapy and so will try and start with Mex adding ranolazine if necessary Will check free T3 and 4   Signed, [name] MD  12/31/2014

## 2014-12-31 NOTE — Progress Notes (Signed)
[facility]ious [facility] Progress Note  Subjective:  No acute events overnight. Mr. Antunez was seen and examined this morning.  He has no acute complaints.  He denies fever/chills, chest pain or lightheadedness.  He has a good appetite, ate a good dinner last night, but has been NPO since midnight for LHC today.  Objective: Vital signs in last 24 hours: Filed Vitals:   12/31/14 0500 12/31/14 0600 12/31/14 0700 12/31/14 0800  BP: 123/69 100/65 107/67 108/76  Pulse: 64 59 63 61  Temp:    97.9 F (36.6 C)  TempSrc:    Oral  Resp: 18 16 19 13   Height:      Weight: 192 lb 8 oz (87.317 kg)     SpO2: 97% 96% 99% 96%   Weight change: 0 lb (0 kg)  Intake/Output Summary (Last 24 hours) at 12/31/14 0844 Last data filed at 12/31/14 0700  Gross per 24 hour  Intake    845 ml  Output   1725 ml  Net   -880 ml   General: sitting up in bed in NAD watching tv HEENT: [place]/AT Cardiac: RRR, no rubs or gallops; [id number] systolic murmur Pulm: clear to auscultation bilaterally, moving normal volumes of air Abd: soft, nontender, nondistended, BS present Ext: warm and well perfused, no pedal edema Skin:  ? Splinter hemorrhage on right second fingernail Neuro: alert and oriented X3, responds appropriately, MAE spontaneously  Lab Results: Basic Metabolic Panel:  Recent Labs Lab 12/29/14 1848 12/30/14 0049 12/30/14 0452  NA 139  --  140  K 3.8  --  4.0  CL 107  --  106  CO2 26  --  24  GLUCOSE 93  --  101*  BUN 20  --  16  CREATININE 1.29*  --  1.19  CALCIUM 9.1  --  8.6*  MG  --  2.1  --    Liver Function Tests:  Recent Labs Lab 12/29/14 1848  AST 29  ALT 24  ALKPHOS 46  BILITOT 0.7  PROT 6.0*  ALBUMIN 3.7    Recent Labs Lab 12/30/14 0049  LIPASE 24   CBC:  Recent Labs Lab 12/29/14 1848 12/30/14 0452  WBC 6.5 5.0  NEUTROABS 4.3  --   HGB 13.9 13.2  HCT 41.1 39.0  MCV 88.6 88.4  PLT 146* 152   Cardiac Enzymes:  Recent Labs Lab 12/30/14 0049 12/30/14 0452  12/30/14 1106  TROPONINI 0.03 0.04* <0.03   CBG:  Recent Labs Lab 12/30/14 0745  GLUCAP 79   Fasting Lipid Panel:  Recent Labs Lab 12/30/14 0452  CHOL 168  HDL 43  LDLCALC 107*  TRIG 89  CHOLHDL 3.9   Thyroid Function Tests:  Recent Labs Lab 12/30/14 0452  TSH 8.262*   Coagulation:  Recent Labs Lab 12/30/14 0049  LABPROT 14.5  INR 1.12   Micro Results: Recent Results (from the past 240 hour(s))  MRSA PCR Screening     Status: None   Collection Time: 12/30/14  4:49 AM  Result Value Ref Range Status   MRSA by PCR NEGATIVE NEGATIVE Final    Comment:        The GeneXpert MRSA Assay (FDA approved for NASAL specimens only), is one component of a comprehensive MRSA colonization surveillance program. It is not intended to diagnose MRSA infection nor to guide or monitor treatment for MRSA infections.   Culture, blood (routine x 2)     Status: None (Preliminary result)   Collection Time: 12/30/14  8:50  AM  Result Value Ref Range Status   Specimen Description BLOOD RIGHT HAND  Final   Special Requests BOTTLES DRAWN AEROBIC AND ANAEROBIC 6CC  Final   Culture   Final           BLOOD CULTURE RECEIVED NO GROWTH TO DATE CULTURE WILL BE HELD FOR 5 DAYS BEFORE ISSUING A FINAL NEGATIVE REPORT Performed at [facility]    Report Status PENDING  Incomplete  Culture, blood (routine x 2)     Status: None (Preliminary result)   Collection Time: 12/30/14  8:56 AM  Result Value Ref Range Status   Specimen Description BLOOD RIGHT ARM  Final   Special Requests BOTTLES DRAWN AEROBIC ONLY 10CC  Final   Culture   Final           BLOOD CULTURE RECEIVED NO GROWTH TO DATE CULTURE WILL BE HELD FOR 5 DAYS BEFORE ISSUING A FINAL NEGATIVE REPORT Performed at [facility]    Report Status PENDING  Incomplete   Studies/Results: Dg Chest 2 View  12/29/2014   CLINICAL DATA:  Chest pain and shortness of breath  EXAM: CHEST  2 VIEW  COMPARISON:  March 26, 2014   FINDINGS: Patient is status post median sternotomy. There is no edema or consolidation. The heart size and pulmonary vascularity are normal. No adenopathy. No bone lesions.  IMPRESSION: No edema or consolidation.   Electronically Signed   By: Lowella Grip III M.D.   On: 12/29/2014 19:57   Medications: I have reviewed the patient's current medications. Scheduled Meds: . aspirin  81 mg Oral Pre-Cath  . aspirin EC  81 mg Oral Daily  . atorvastatin  40 mg Oral q1800  . carvedilol  6.25 mg Oral BID WC  . heparin  5,000 Units Subcutaneous 3 times per day  . levothyroxine  137 mcg Oral QAC breakfast  . lisinopril  2.5 mg Oral Daily  . mexiletine  150 mg Oral Q12H  . multivitamin with minerals  1 tablet Oral QODAY  . mupirocin ointment   Nasal BID  . pantoprazole  40 mg Oral Q1200  . sertraline  50 mg Oral Daily  . sodium chloride  3 mL Intravenous Q12H  . sodium chloride  3 mL Intravenous Q12H  . sodium chloride  3 mL Intravenous Q12H  . triamcinolone cream   Topical BID   Continuous Infusions: . [START ON 01/01/2015] sodium chloride     Followed by  . [START ON 01/01/2015] sodium chloride    . sodium chloride     PRN Meds:.sodium chloride, sodium chloride, acetaminophen **OR** acetaminophen, alum & mag hydroxide-simeth, clonazePAM, morphine injection, nitroGLYCERIN, sodium chloride, sodium chloride   Assessment/Plan: AVRDeneen Harts 2011, re-do March 2015 secondary to MSSA valve endocarditis: No objective findings for endocarditis presently or recurrent infection at this time. Blood cx NGTD.  No indication for abx. Cardiology/EP evaluation underway. Given his hx of having MSSA infection, he is likely still colonized. Colonized patients do have risk of having invasive infection with elective surgeries as noted for joint replacement. This risk is minimized if patients are decolonized prior to upcoming surgery. If we extrapolate this information for ICD placement, I believe that his risk of  MSSA would decrease if we decolonize with hibiclens bath daily x 3 d and nasal mupirocen BID x 3 d. - continue daily hibiclens and BID nasal mupirocen x 2 more days (today and tomorrow) - if he needs urgent ICD placement, would recommend to use antiseptic,  intranasal povo-iodine that is formulary - monitor for S&S of infection  Sustained VTach: Being evaluated by EP for ICD (likely tomorrow AM).  Cardiac catheterization planned for today.  Fatigue:  Could be due to hypothyroidsim. He has elevated TSH and low end of normal range of FT4.  Synthroid per primary.  Would recommend outpatient follow up with endocrine for further management.   LOS: 2 days   Francesca Oman, DO IMTS PGY2 on ID Consult Service 12/31/2014, 8:44 AM

## 2015-01-01 ENCOUNTER — Encounter (HOSPITAL_COMMUNITY)
Admission: EM | Disposition: A | Discharge status: Home / Self Care | Payer: Self-pay | Source: Home / Self Care | Attending: Internal Medicine

## 2015-01-01 ENCOUNTER — Encounter (HOSPITAL_COMMUNITY)
Admission: EM | Disposition: A | Discharge status: Home / Self Care | Payer: Commercial Managed Care - HMO | Source: Home / Self Care | Attending: Internal Medicine

## 2015-01-01 DIAGNOSIS — I472 Ventricular tachycardia: Principal | ICD-10-CM

## 2015-01-01 DIAGNOSIS — Z954 Presence of other heart-valve replacement: Secondary | ICD-10-CM

## 2015-01-01 HISTORY — PX: EP IMPLANTABLE DEVICE: SHX172B

## 2015-01-01 HISTORY — PX: CARDIAC CATHETERIZATION: SHX172

## 2015-01-01 HISTORY — PX: PERIPHERAL VASCULAR CATHETERIZATION: SHX172C

## 2015-01-01 LAB — GLUCOSE, CAPILLARY
Glucose-Capillary: 109 mg/dL — ABNORMAL HIGH (ref 65–99)
Glucose-Capillary: 91 mg/dL (ref 65–99)

## 2015-01-01 SURGERY — CORONARY/GRAFT ANGIOGRAPHY

## 2015-01-01 SURGERY — ICD IMPLANT
Anesthesia: LOCAL

## 2015-01-01 MED ORDER — IOHEXOL 350 MG/ML SOLN
INTRAVENOUS | Status: DC | PRN
  Administered 2015-01-01: 80 mL via INTRA_ARTERIAL

## 2015-01-01 MED ORDER — FENTANYL CITRATE (PF) 100 MCG/2ML IJ SOLN
INTRAMUSCULAR | Status: AC
  Filled 2015-01-01: qty 2

## 2015-01-01 MED ORDER — SODIUM CHLORIDE 0.9 % IV SOLN: INTRAVENOUS | Status: AC

## 2015-01-01 MED ORDER — CEFAZOLIN SODIUM 1-5 GM-% IV SOLN
1.0000 g | Freq: Four times a day (QID) | INTRAVENOUS | Status: AC
Start: 2015-01-01 — End: 2015-01-02
  Administered 2015-01-01 – 2015-01-02 (×3): 1 g via INTRAVENOUS
  Filled 2015-01-01 (×3): qty 50

## 2015-01-01 MED ORDER — HEPARIN (PORCINE) IN NACL 2-0.9 UNIT/ML-% IJ SOLN
INTRAMUSCULAR | Status: AC
  Filled 2015-01-01: qty 1500

## 2015-01-01 MED ORDER — ACETAMINOPHEN 325 MG PO TABS: 325.0000 mg | ORAL_TABLET | ORAL | Status: DC | PRN

## 2015-01-01 MED ORDER — ACETAMINOPHEN 325 MG PO TABS: 650.0000 mg | ORAL_TABLET | ORAL | Status: DC | PRN

## 2015-01-01 MED ORDER — SODIUM CHLORIDE 0.9 % IJ SOLN: 3.0000 mL | INTRAMUSCULAR | Status: DC | PRN

## 2015-01-01 MED ORDER — LIDOCAINE HCL (PF) 1 % IJ SOLN
INTRAMUSCULAR | Status: AC
  Filled 2015-01-01: qty 30

## 2015-01-01 MED ORDER — CEFAZOLIN SODIUM 1-5 GM-% IV SOLN
1.0000 g | Freq: Four times a day (QID) | INTRAVENOUS | Status: DC
  Filled 2015-01-01 (×3): qty 50

## 2015-01-01 MED ORDER — HEPARIN (PORCINE) IN NACL 2-0.9 UNIT/ML-% IJ SOLN
INTRAMUSCULAR | Status: AC
  Filled 2015-01-01: qty 500

## 2015-01-01 MED ORDER — LIDOCAINE HCL (PF) 1 % IJ SOLN
INTRAMUSCULAR | Status: DC | PRN
  Administered 2015-01-01: 20 mL via SUBCUTANEOUS

## 2015-01-01 MED ORDER — DEXTROSE 5 % IV SOLN
2.0000 g | INTRAVENOUS | Status: DC | PRN
  Administered 2015-01-01: 2 g via INTRAVENOUS

## 2015-01-01 MED ORDER — MIDAZOLAM HCL 5 MG/5ML IJ SOLN
INTRAMUSCULAR | Status: DC | PRN
  Administered 2015-01-01: 1 mg via INTRAVENOUS
  Administered 2015-01-01: 2 mg via INTRAVENOUS
  Administered 2015-01-01: 1 mg via INTRAVENOUS

## 2015-01-01 MED ORDER — CEFAZOLIN SODIUM-DEXTROSE 2-3 GM-% IV SOLR
INTRAVENOUS | Status: AC
  Filled 2015-01-01: qty 50

## 2015-01-01 MED ORDER — ONDANSETRON HCL 4 MG/2ML IJ SOLN
4.0000 mg | Freq: Four times a day (QID) | INTRAMUSCULAR | Status: DC | PRN
Start: 2015-01-01 — End: 2015-01-01

## 2015-01-01 MED ORDER — MIDAZOLAM HCL 5 MG/5ML IJ SOLN
INTRAMUSCULAR | Status: AC
  Filled 2015-01-01: qty 5

## 2015-01-01 MED ORDER — FENTANYL CITRATE (PF) 100 MCG/2ML IJ SOLN
INTRAMUSCULAR | Status: DC | PRN
  Administered 2015-01-01: 50 ug via INTRAVENOUS

## 2015-01-01 MED ORDER — FENTANYL CITRATE (PF) 100 MCG/2ML IJ SOLN
INTRAMUSCULAR | Status: DC | PRN
  Administered 2015-01-01 (×2): 25 ug via INTRAVENOUS
  Administered 2015-01-01: 50 ug via INTRAVENOUS

## 2015-01-01 MED ORDER — ONDANSETRON HCL 4 MG/2ML IJ SOLN: 4.0000 mg | Freq: Four times a day (QID) | INTRAMUSCULAR | Status: DC | PRN

## 2015-01-01 MED ORDER — SODIUM CHLORIDE 0.9 % IV SOLN: 250.0000 mL | INTRAVENOUS | Status: DC | PRN

## 2015-01-01 MED ORDER — SODIUM CHLORIDE 0.9 % IJ SOLN: 3.0000 mL | Freq: Two times a day (BID) | INTRAMUSCULAR | Status: DC

## 2015-01-01 MED ORDER — MIDAZOLAM HCL 2 MG/2ML IJ SOLN
INTRAMUSCULAR | Status: AC
  Filled 2015-01-01: qty 2

## 2015-01-01 MED ORDER — LIDOCAINE HCL (PF) 1 % IJ SOLN
INTRAMUSCULAR | Status: DC | PRN
  Administered 2015-01-01: 40 mL via INTRADERMAL

## 2015-01-01 MED ORDER — MIDAZOLAM HCL 2 MG/2ML IJ SOLN
INTRAMUSCULAR | Status: DC | PRN
  Administered 2015-01-01: 2 mg via INTRAVENOUS

## 2015-01-01 MED ORDER — LIDOCAINE HCL (PF) 1 % IJ SOLN
INTRAMUSCULAR | Status: AC
  Filled 2015-01-01: qty 60

## 2015-01-01 SURGICAL SUPPLY — 7 items
CATH INFINITI 5FR MULTPACK ANG (CATHETERS) ×3 IMPLANT
KIT HEART LEFT (KITS) ×3 IMPLANT
PACK CARDIAC CATHETERIZATION (CUSTOM PROCEDURE TRAY) ×3 IMPLANT
SHEATH PINNACLE 5F 10CM (SHEATH) ×3 IMPLANT
SYR MEDRAD MARK V 150ML (SYRINGE) ×3 IMPLANT
TRANSDUCER W/STOPCOCK (MISCELLANEOUS) ×3 IMPLANT
WIRE EMERALD 3MM-J .035X150CM (WIRE) ×3 IMPLANT

## 2015-01-01 SURGICAL SUPPLY — 7 items
CABLE SURGICAL S-101-97-12 (CABLE) ×2 IMPLANT
ICD ELLIPSE VR CD1411-36C (ICD Generator) ×2 IMPLANT
LEAD DURATA 7122-65CM (Lead) ×2 IMPLANT
PAD DEFIB LIFELINK (PAD) ×2 IMPLANT
POUCH AIGIS-R ANTIBACT ICD (Mesh General) ×2 IMPLANT
SHEATH CLASSIC 8F (SHEATH) ×2 IMPLANT
TRAY PACEMAKER INSERTION (CUSTOM PROCEDURE TRAY) ×2 IMPLANT

## 2015-01-01 NOTE — Progress Notes (Signed)
Site area: right  Groin a 5 french arterial sheath was removed  Site Prior to Removal:  Level 0  Pressure Applied For 20 MINUTES    Minutes Beginning at 0900am  Manual:   Yes.    Patient Status During Pull:  stable  Post Pull Groin Site:  Level 0  Post Pull Instructions Given:  Yes.    Post Pull Pulses Present:  Yes.    Dressing Applied:  Yes.    Comments:  VS remain stable during sheath pull. Pt denies any discomfort at this time.

## 2015-01-01 NOTE — Interval H&P Note (Signed)
Cath Lab Visit (complete for each Cath Lab visit)  Clinical Evaluation Leading to the Procedure:   ACS: No.  Non-ACS:    Anginal Classification: CCS II  Anti-ischemic medical therapy: Minimal Therapy (1 class of medications)  Non-Invasive Test Results: No non-invasive testing performed  Prior CABG: No previous CABG      History and Physical Interval Note:  01/01/2015 8:06 AM  Bradley [name]  has presented today for surgery, with the diagnosis of tachibradia  The various methods of treatment have been discussed with the patient and family. After consideration of risks, benefits and other options for treatment, the patient has consented to  Procedure(s): Left Heart Cath and Coronary Angiography (N/A) as a surgical intervention .  The patient's history has been reviewed, patient examined, no change in status, stable for surgery.  I have reviewed the patient's chart and labs.  Questions were answered to the patient's satisfaction.     Raynald Rouillard A

## 2015-01-01 NOTE — Interval H&P Note (Signed)
ICD Criteria  Current LVEF:30% ;Obtained < 1 month ago.  NYHA Functional Classification: Class II  Heart Failure History:  Yes, Duration of heart failure since onset is > 9 months  Non-Ischemic Dilated Cardiomyopathy History:  Yes, timeframe is > 9 months  Atrial Fibrillation/Atrial Flutter:  No.  Ventricular Tachycardia History:  Yes, No hemodynamic instability. VT Type:  SVT - Monomorphic.  Cardiac Arrest History:  No  History of Syndromes with Risk of Sudden Death:  No.  Previous ICD:  No.  Electrophysiology Study: No.  Prior MI: Yes, Most recent MI timeframe is > 40 days.  PPM: No.  OSA:  No  Patient Life Expectancy of >=1 year: Yes.  Anticoagulation Therapy:  Patient is NOT on anticoagulation therapy.   Beta Blocker Therapy:  Yes.   Ace Inhibitor/ARB Therapy:  Yes.History and Physical Interval Note:  01/01/2015 10:18 AM  Bradley [name]  has presented today for surgery, with the diagnosis of cardiomyopathy  The various methods of treatment have been discussed with the patient and family. After consideration of risks, benefits and other options for treatment, the patient has consented to  Procedure(s): ICD Implant (N/A) as a surgical intervention .  The patient's history has been reviewed, patient examined, no change in status, stable for surgery.  I have reviewed the patient's chart and labs.  Questions were answered to the patient's satisfaction.     [name]

## 2015-01-01 NOTE — Progress Notes (Signed)
Procedure explained to patient and Rt femoral artery access site assessed: level 0, palpable RPT. 5French Sheath removed and manual pressure applied for 20 minutes. Pre, peri, & post procedural vitals: HR 59, RR 20, O2 Sat 96, BP 124/86, Pain 0. Distal pulses remained intact after sheath removal. Access site level 0 and dressed with 4X4 gauze and Tegaderm.  Post procedural instructions discussed and return demonstration from patient. Dr. Caryl Comes came in to sign consent for ICD procedure. Mr. Spofford will be transferring to EP Lab.

## 2015-01-01 NOTE — Progress Notes (Signed)
PROGRESS NOTE    Bradley [name] [id number] DOB: [birth year] DOA: 12/29/2014 PCP: [name], MD  HPI/Brief narrative 59 y.o. male with PMH of hypertension, history of thyroid cancer (post status of thyroidectomy), hypothyroidism, depression, anxiety, AVR, hx of aortic root abscess, chronic kidney disease-stage II, combined systolic and diastolic congestive heart failure, who presents with chest pain, palpitation and fainting episodes. Found to have monomorphic ventricular tachycardia. Cardiology consulted and patient admitted to stepdown unit and started on IV lidocaine drip. EPS consult at and plan for cardiac cath 6/2. ID consulted for input regarding placement of transvenous ICD   Assessment/Plan:  Chest pain  - No pneumonia on chest x-ray. Less likely to have pulmonary embolism (chest is not pleuratic, oxygen saturation is 97% at room air, Well's score is low probability).  - Patient admitted to stepdown unit for close monitoring and management - started lipitor  - Risk factor stratification: will check FLP (LDL 107) and A1C 5.9 - Minimal troponin elevation of unclear significance - S/P Cardiac Cath and ICD placement on 6/3  Sustained monomorphic ventricular tachycardia -Patient symptomatic of same - Echo 02/2014: EF 35-40 percent. Repeat echo : LVEF 30% to 35%. Akinesis of the midanteroseptal myocardium.. - EPS input appreciated and requested ID to weigh in regarding transvenous ICD-given history of MSSA bacteremia. (As per ID recommendation: Patient likely has MSSA colonization and hence has increased risk of having invasive infection and this risk is minimized if patients are decolonized for 3 days with Hibiclens bath and nasal mupirocin) - Initially started on lidocaine drip which was discontinued. - Cardiology have also changed metoprolol to carvedilol  - TSH elevated at 8 range but better compared to recent - Status post cardiac cath and ICD placement on  6/3  Cardiomyopathy - For evaluation by cath 6/2 - Continue lisinopril. Titrate up as blood pressure and renal functions allow. - Continue carvedilol  Abdominal soreness:  - unclear etiology. His examination is benign. No acute abdomen - check lipase: Normal - start Protonix empirically for possible GERD - Seems to have resolved.  Hypothyroidism:  - Recent TSH was 12.03 on 12/07/14, his Synthroid dose was adjusted recently. Currently is on 137 g daily. -Continue current dose of Synthroid  -check TSH: 8.262. Will not change Synthroid dose which was recently adjusted up area repeat TSH in 4-6 weeks  AVR- Bentall 2011, re-do March 2015 secondary to valve endocarditis:  - He now has a cadaver valve, he takes a baby aspirin daily. - continue ASA  Depression and generalized anxiety disorder:  - stable. No suicidal or homicidal ideations. - Continue clonidine and Zoloft  Chronic combined systolic and diastolic CHF:  - 2-D echo on 03/19/14 showed EF 35-40% with grade 1 diastolic dysfunction. CHF is compensated on admission. Patient does not have any leg edema. He is on Lasix 20 mg twice a week at home. - Hold Lasix - Compensated  CKD (chronic kidney disease), stage II:  - Baseline creatinine 1.3. His creatinine is 1.29 on admission, which is at baseline.  - Stable  HTN: -Continue lisinopril and carvedilol   DVT prophylaxis: Subcutaneous heparin  Code Status: Full  Family Communication: None at bedside  Disposition Plan: Continue management in stepdown unit  Consultants:  Cardiology/EPS  Infectious disease  Procedures:  2 D Echo 6/1: Study Conclusions  - Left ventricle: The cavity size was moderately dilated. Wall thickness was increased in a pattern of mild LVH. Systolic function was moderately to severely reduced. The estimated ejection fraction  was in the range of 30% to 35%. Akinesis of the midanteroseptal myocardium. - Aortic valve: There was mild  regurgitation. Valve area (VTI): 2.37 cm^2. Valve area (Vmax): 2.53 cm^2. Valve area (Vmean): 2.47 cm^2. - Mitral valve: There was mild regurgitation. - Left atrium: The atrium was moderately dilated. - Right ventricle: The cavity size was mildly dilated. - Right atrium: The atrium was mildly dilated.  Cardiac cath and ICD placement on 6/3  Antibiotics:  None   Subjective: Patient seen post procedure on telemetry floor. Denied complaints. Relieved that the procedure was completed.  Objective: Filed Vitals:   01/01/15 1410 01/01/15 1425 01/01/15 1440 01/01/15 1455  BP: 139/82 130/82 124/85 126/85  Pulse: 66 62 65 66  Temp:      TempSrc:      Resp: 24 23 19 23   Height:      Weight:      SpO2: 99% 97% 96% 96%    Intake/Output Summary (Last 24 hours) at 01/01/15 1644 Last data filed at 01/01/15 1300  Gross per 24 hour  Intake 919.92 ml  Output   1225 ml  Net -305.08 ml   Filed Weights   12/30/14 0400 12/31/14 0500 01/01/15 0600  Weight: 87.317 kg (192 lb 8 oz) 87.317 kg (192 lb 8 oz) 87.59 kg (193 lb 1.6 oz)     Exam:  General exam: Pleasant middle-aged male lying comfortably supine in bed  Respiratory system: Clear. No increased work of breathing. Cardiovascular system: S1 & S2 heard, RRR. No JVD, murmurs, gallops, clicks or pedal edema. Gastrointestinal system: Abdomen is nondistended, soft and nontender. Normal bowel sounds heard. Central nervous system: Alert and oriented. No focal neurological deficits. Extremities: Symmetric 5 x 5 power.   Data Reviewed: Basic Metabolic Panel:  Recent Labs Lab 12/29/14 1848 12/30/14 0049 12/30/14 0452  NA 139  --  140  K 3.8  --  4.0  CL 107  --  106  CO2 26  --  24  GLUCOSE 93  --  101*  BUN 20  --  16  CREATININE 1.29*  --  1.19  CALCIUM 9.1  --  8.6*  MG  --  2.1  --    Liver Function Tests:  Recent Labs Lab 12/29/14 1848  AST 29  ALT 24  ALKPHOS 46  BILITOT 0.7  PROT 6.0*  ALBUMIN 3.7     Recent Labs Lab 12/30/14 0049  LIPASE 24   No results for input(s): AMMONIA in the last 168 hours. CBC:  Recent Labs Lab 12/29/14 1848 12/30/14 0452  WBC 6.5 5.0  NEUTROABS 4.3  --   HGB 13.9 13.2  HCT 41.1 39.0  MCV 88.6 88.4  PLT 146* 152   Cardiac Enzymes:  Recent Labs Lab 12/30/14 0049 12/30/14 0452 12/30/14 1106  TROPONINI 0.03 0.04* <0.03   BNP (last 3 results)  Recent Labs  01/06/14 1815  PROBNP 9238.0*   CBG:  Recent Labs Lab 12/30/14 0745 12/31/14 0837  GLUCAP 79 99    Recent Results (from the past 240 hour(s))  MRSA PCR Screening     Status: None   Collection Time: 12/30/14  4:49 AM  Result Value Ref Range Status   MRSA by PCR NEGATIVE NEGATIVE Final    Comment:        The GeneXpert MRSA Assay (FDA approved for NASAL specimens only), is one component of a comprehensive MRSA colonization surveillance program. It is not intended to diagnose MRSA infection nor to guide or  monitor treatment for MRSA infections.   Culture, blood (routine x 2)     Status: None (Preliminary result)   Collection Time: 12/30/14  8:50 AM  Result Value Ref Range Status   Specimen Description BLOOD RIGHT HAND  Final   Special Requests BOTTLES DRAWN AEROBIC AND ANAEROBIC 6CC  Final   Culture   Final           BLOOD CULTURE RECEIVED NO GROWTH TO DATE CULTURE WILL BE HELD FOR 5 DAYS BEFORE ISSUING A FINAL NEGATIVE REPORT Performed at [facility]    Report Status PENDING  Incomplete  Culture, blood (routine x 2)     Status: None (Preliminary result)   Collection Time: 12/30/14  8:56 AM  Result Value Ref Range Status   Specimen Description BLOOD RIGHT ARM  Final   Special Requests BOTTLES DRAWN AEROBIC ONLY 10CC  Final   Culture   Final           BLOOD CULTURE RECEIVED NO GROWTH TO DATE CULTURE WILL BE HELD FOR 5 DAYS BEFORE ISSUING A FINAL NEGATIVE REPORT Performed at [facility]    Report Status PENDING  Incomplete           Studies: No results found.      Scheduled Meds: . aspirin EC  81 mg Oral Daily  . atorvastatin  40 mg Oral q1800  . carvedilol  6.25 mg Oral BID WC  .  ceFAZolin (ANCEF) IV  1 g Intravenous Q6H  . levothyroxine  137 mcg Oral QAC breakfast  . lisinopril  2.5 mg Oral Daily  . mexiletine  150 mg Oral Q12H  . multivitamin with minerals  1 tablet Oral QODAY  . mupirocin ointment   Nasal BID  . pantoprazole  40 mg Oral Q1200  . sertraline  50 mg Oral Daily  . sodium chloride  3 mL Intravenous Q12H  . sodium chloride  3 mL Intravenous Q12H  . triamcinolone cream   Topical BID   Continuous Infusions: . 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    Vernell Leep, MD, FACP, FHM. [facility] Pager [phone]  If 7PM-7AM, please contact night-coverage [email or web address] Password [email or web address] 01/01/2015, 4:44 PM    LOS: 3 days

## 2015-01-01 NOTE — Progress Notes (Signed)
Pt to EP lab for procedure.

## 2015-01-01 NOTE — Progress Notes (Signed)
MET WITH pt and wife this am to review the plan for cath and need for ICD for secondary prevention and adjunctive medical therapy given the frequency of symptomatic VT

## 2015-01-01 NOTE — H&P (View-Only) (Signed)
Patient Name: Bradley [name]      SUBJECTIVE without chest pain or SOB  Echo 30-35% 6/16  Past Medical History  Diagnosis Date  . Anxiety   . Depression   . Hypertension   . Hypothyroidism, postsurgical   . Aortic stenosis     s/p Bentall with bioprosthetic AVR 02/2010; Last echo (9/11): Moderate LVH, EF 45-50%, AVR functioning appropriately, aortic valve mean gradient 21, diastolic dysfunction. Chest MRA (2/13): Mild to moderate dilatation at the sinus of Valsalva at 4.1 cm, mild dilatation ascending aorta distal to the tube graft at 3.9 cm, moderate dilatation of the innominate artery a 2.1 cm;    . Hx of cardiac cath     a. LHC in 02/2010: normal cors  . Hx of echocardiogram 2014    Echo (10/14): Severe LVH, EF 60-65%, normal wall motion, grade 1 diastolic dysfunction, AVR functioning normally, mild aortic stenosis (mean 19), moderately dilated aorta, mild LAE, mild RVE  . Staphylococcus aureus bacteremia   . Prosthetic valve endocarditis   . Thyroid cancer   . ESRD on dialysis     a. 09/2013 felt to be related to gentamycin b. no longer requiring HD  . Hx of echocardiogram 02/2014    Echo (8/15):  Mod LV, EF 35-40%, Gr 1 DD, AVR ok (mean 14 mmHg), mild LAE    Scheduled Meds:  Scheduled Meds: . aspirin  81 mg Oral Pre-Cath  . aspirin EC  325 mg Oral Daily  . atorvastatin  40 mg Oral q1800  . heparin  5,000 Units Subcutaneous 3 times per day  . levothyroxine  137 mcg Oral QAC breakfast  . lisinopril  2.5 mg Oral Daily  . metoprolol tartrate  12.5 mg Oral BID  . multivitamin with minerals  1 tablet Oral QODAY  . mupirocin ointment   Nasal BID  . pantoprazole  40 mg Oral Q1200  . sertraline  50 mg Oral Daily  . sodium chloride  3 mL Intravenous Q12H  . sodium chloride  3 mL Intravenous Q12H  . sodium chloride  3 mL Intravenous Q12H  . triamcinolone cream   Topical BID   Continuous Infusions: . [START ON 01/01/2015] sodium chloride     Followed by  . [START  ON 01/01/2015] sodium chloride    . sodium chloride     Followed by  . sodium chloride    . lidocaine 50 mg (12/30/14 2000)   sodium chloride, sodium chloride, acetaminophen **OR** acetaminophen, alum & mag hydroxide-simeth, clonazePAM, morphine injection, nitroGLYCERIN, sodium chloride, sodium chloride    PHYSICAL EXAM Filed Vitals:   12/31/14 0413 12/31/14 0500 12/31/14 0600 12/31/14 0700  BP:  123/69 100/65 107/67  Pulse:  64 59 63  Temp: 97.7 F (36.5 C)     TempSrc: Oral     Resp:  18 16 19   Height:      Weight:  87.317 kg (192 lb 8 oz)    SpO2:  97% 96% 99%    Well developed and nourished in no acute distress HENT normal Neck supple with JVP-flat Clear Regular rate and rhythm, 2/6 murmur  Abd-soft with active BS No Clubbing cyanosis edema Skin-warm and dry A & Oriented  Grossly normal sensory and motor function  TELEMETRY: Reviewed telemetry pt in  NSR:    Intake/Output Summary (Last 24 hours) at 12/31/14 0737 Last data filed at 12/31/14 0700  Gross per 24 hour  Intake    910 ml  Output   1725 ml  Net   -815 ml    LABS: Basic Metabolic Panel:  Recent Labs Lab 12/29/14 1848 12/30/14 0049 12/30/14 0452  NA 139  --  140  K 3.8  --  4.0  CL 107  --  106  CO2 26  --  24  GLUCOSE 93  --  101*  BUN 20  --  16  CREATININE 1.29*  --  1.19  CALCIUM 9.1  --  8.6*  MG  --  2.1  --    Cardiac Enzymes:  Recent Labs  12/30/14 0049 12/30/14 0452 12/30/14 1106  TROPONINI 0.03 0.04* <0.03   CBC:  Recent Labs Lab 12/29/14 1848 12/30/14 0452  WBC 6.5 5.0  NEUTROABS 4.3  --   HGB 13.9 13.2  HCT 41.1 39.0  MCV 88.6 88.4  PLT 146* 152   PROTIME:  Recent Labs  12/30/14 0049  LABPROT 14.5  INR 1.12   Liver Function Tests:  Recent Labs  12/29/14 1848  AST 29  ALT 24  ALKPHOS 46  BILITOT 0.7  PROT 6.0*  ALBUMIN 3.7    Recent Labs  12/30/14 0049  LIPASE 24   BNP: BNP (last 3 results)  Recent Labs  12/30/14 0452  BNP 550.5*      ProBNP (last 3 results)  Recent Labs  01/06/14 1815  PROBNP 9238.0*    D-Dimer: No results for input(s): DDIMER in the last 72 hours. Hemoglobin A1C: No results for input(s): HGBA1C in the last 72 hours. Fasting Lipid Panel:  Recent Labs  12/30/14 0452  CHOL 168  HDL 43  LDLCALC 107*  TRIG 89  CHOLHDL 3.9   Thyroid Function Tests:  Recent Labs  12/30/14 0452  TSH 8.262*      ASSESSMENT AND PLAN:  Active Problems:   Hypothyroidism   AVR- Bentall 2011, re-do March 2015 secondary to valve endocarditis   Hypertension    Major depressive disorder, recurrent, severe with psychotic features- hospitalized in Jan 2013   Chronic combined systolic and diastolic CHF (congestive heart failure)   CKD (chronic kidney disease), stage II   Paroxysmal ventricular tachycardia   History of acute inferior wall MI  For Cath today ICD anticipated in am With recurrent VT will need adjunctive antiarrhythmic therapy and so will try and start with Mex adding ranolazine if necessary Will check free T3 and 4   Signed, [name] MD  12/31/2014

## 2015-01-02 ENCOUNTER — Inpatient Hospital Stay (HOSPITAL_COMMUNITY): Payer: Commercial Managed Care - HMO

## 2015-01-02 DIAGNOSIS — E039 Hypothyroidism, unspecified: Secondary | ICD-10-CM

## 2015-01-02 DIAGNOSIS — Z95810 Presence of automatic (implantable) cardiac defibrillator: Secondary | ICD-10-CM

## 2015-01-02 DIAGNOSIS — Z22321 Carrier or suspected carrier of Methicillin susceptible Staphylococcus aureus: Secondary | ICD-10-CM

## 2015-01-02 LAB — BASIC METABOLIC PANEL
Anion gap: 8 (ref 5–15)
BUN: 17 mg/dL (ref 6–20)
CO2: 26 mmol/L (ref 22–32)
Calcium: 8.7 mg/dL — ABNORMAL LOW (ref 8.9–10.3)
Chloride: 106 mmol/L (ref 101–111)
Creatinine, Ser: 1.12 mg/dL (ref 0.61–1.24)
GFR calc Af Amer: >60 mL/min (ref >60)
GFR calc non Af Amer: >60 mL/min (ref >60)
Glucose, Bld: 103 mg/dL — ABNORMAL HIGH (ref 65–99)
Potassium: 4 mmol/L (ref 3.5–5.1)
Sodium: 140 mmol/L (ref 135–145)

## 2015-01-02 LAB — GLUCOSE, CAPILLARY
Glucose-Capillary: 105 mg/dL — ABNORMAL HIGH (ref 65–99)
Glucose-Capillary: 118 mg/dL — ABNORMAL HIGH (ref 65–99)

## 2015-01-02 LAB — CBC
HCT: 40.6 % (ref 39.0–52.0)
Hemoglobin: 13.7 g/dL (ref 13.0–17.0)
MCH: 30 pg (ref 26.0–34.0)
MCHC: 33.7 g/dL (ref 30.0–36.0)
MCV: 89 fL (ref 78.0–100.0)
Platelets: 129 10*3/uL — ABNORMAL LOW (ref 150–400)
RBC: 4.56 MIL/uL (ref 4.22–5.81)
RDW: 13.8 % (ref 11.5–15.5)
WBC: 7 10*3/uL (ref 4.0–10.5)

## 2015-01-02 LAB — T3, FREE: T3, Free: 2.3 pg/mL (ref 2.0–4.4)

## 2015-01-02 MED ORDER — CARVEDILOL 6.25 MG PO TABS: 6.2500 mg | ORAL_TABLET | Freq: Two times a day (BID) | ORAL | Status: DC

## 2015-01-02 MED ORDER — ATORVASTATIN CALCIUM 40 MG PO TABS: 40.0000 mg | ORAL_TABLET | Freq: Every day | ORAL | Status: DC

## 2015-01-02 MED ORDER — MEXILETINE HCL 150 MG PO CAPS: 150.0000 mg | ORAL_CAPSULE | Freq: Two times a day (BID) | ORAL | Status: DC

## 2015-01-02 MED ORDER — ACETAMINOPHEN 325 MG PO TABS: 325.0000 mg | ORAL_TABLET | ORAL | Status: DC | PRN

## 2015-01-02 NOTE — Discharge Summary (Signed)
Physician Discharge Summary  Bradley [name] [id number] DOB: 11/28/1955 DOA: 12/29/2014  PCP: [name], MD  Admit date: 12/29/2014 Discharge date: 01/02/2015  Time spent: Greater than 30 minutes  Recommendations for Outpatient Follow-up:  1. Chief of Staff, Cardiology EP NP on 01/13/15 at 3:30 PM 2. Dr. [name], EP cardiologist in 3 months: MDs office will arrange follow-up 3. Dr. [name], PCP in one week with repeat labs (CBC & BMP). Recommend repeating TSH in 2 weeks. 4. 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 male with PMH of hypertension, history of thyroid cancer (post status of thyroidectomy), hypothyroidism, depression, anxiety, AVR, hx of aortic root abscess, chronic kidney disease-stage II, combined systolic and diastolic congestive heart failure, who presents with chest pain, palpitation and fainting episodes. Found to have monomorphic ventricular tachycardia. Cardiology consulted and patient admitted to stepdown unit and started on IV lidocaine drip.   Hospital Course:   Chest pain  - No pneumonia on chest x-ray. Less likely to have pulmonary embolism (chest is not pleuratic, oxygen saturation is 97% at room air, Well's score is low  probability).  - Patient admitted to stepdown unit for close monitoring and management - started lipitor  - Risk factor stratification: will check FLP (LDL 107) and A1C 5.9 - Minimal troponin elevation of unclear significance - S/P Cardiac Cath (clean coronaries) and ICD placement on 6/3 - Cardiology and EP have cleared patient for discharge home.  Symptomatic Sustained monomorphic ventricular tachycardia S/P ICD 01/01/2015 - Echo 02/2014: EF 35-40 percent. Repeat echo : LVEF 30% to 35%. Akinesis of the midanteroseptal myocardium.. - EPS input appreciated and requested ID to weigh in regarding transvenous ICD-given history of MSSA bacteremia. (As per ID recommendation: Patient likely has MSSA colonization and hence has increased risk of having invasive infection and this risk is minimized if patients are decolonized for 3 days with Hibiclens bath and nasal mupirocin) - Initially started on lidocaine drip which was discontinued. - Cardiology have also changed metoprolol to carvedilol  - TSH elevated at 8 range but better compared to recent - Status post cardiac cath and ICD placement on 6/3 - EP cardiology has seen and cleared for discharge home on current doses of lisinopril, Coreg and mexiletine. They have advised patient regarding no driving 6 months and wound care. They have arranged outpatient follow-up.  Cardiomyopathy - Clean coronaries by cardiac cath 6/2 - Continue lisinopril. Titrate up as blood pressure and renal functions allow-as outpatient. - Continue carvedilol  Abdominal soreness:  - unclear etiology. His examination is benign. No acute abdomen - check lipase: Normal - resolved.  Hypothyroidism:  - Recent TSH was 12.03 on 12/07/14, his Synthroid dose was adjusted recently. Currently is on 137 g daily. -Continue current dose of Synthroid  -  check TSH: 8.262. Will not change Synthroid dose which was recently adjusted up area repeat TSH in 2 weeks  AVR- Bentall 2011,  re-do March 2015 secondary to valve endocarditis:  - He now has a cadaver valve, he takes a baby aspirin daily. - continue ASA - As per infectious disease, no objective findings for endocarditis presently or recurrent infection at this time. He received Hibiclens baths and nasal mupirocin in attempt to decolonize prior to ICD placement. He has been advised to seek emergency care if he develops severe pain, warmth, redness or discharge at site of ICD placement.  Depression and generalized anxiety disorder:  - stable. No suicidal or homicidal ideations. - Continue Klonopin and Zoloft  Chronic combined systolic and diastolic CHF:  - 2-D echo on 03/19/14 showed EF 35-40% with grade 1 diastolic dysfunction. CHF is compensated on admission. Patient does not have any leg edema. He is on Lasix 20 mg twice a week at home. - Compensated - resume home dose of Lasix at discharge.   CKD (chronic kidney disease), stage II:  - Baseline creatinine 1.3. His creatinine is 1.29 on admission, which is at baseline.  - Stable  HTN: -Continue lisinopril and carvedilol  Mild thrombocytopenia - Unclear etiology. Follow CBC during PCP follow-up in one week.     Consultants:  Cardiology/EPS  Infectious disease  Procedures:  2 D Echo 6/1: Study Conclusions  - Left ventricle: The cavity size was moderately dilated. Wall thickness was increased in a pattern of mild LVH. Systolic function was moderately to severely reduced. The estimated ejection fraction was in the range of 30% to 35%. Akinesis of the midanteroseptal myocardium. - Aortic valve: There was mild regurgitation. Valve area (VTI): 2.37 cm^2. Valve area (Vmax): 2.53 cm^2. Valve area (Vmean): 2.47 cm^2. - Mitral valve: There was mild regurgitation. - Left atrium: The atrium was moderately dilated. - Right ventricle: The cavity size was mildly dilated. - Right atrium: The atrium was mildly dilated.  Cardiac cath and ICD  placement on 6/3   Discharge Exam:  Complaints: Denies complaints.  Filed Vitals:   01/01/15 2300 01/02/15 0200 01/02/15 0400 01/02/15 0500  BP: 125/86 103/67 113/73 119/80  Pulse: 63 71 61 63  Temp:    98.5 F (36.9 C)  TempSrc:    Oral  Resp: 16 24 14 16   Height:    6\' 1"  (1.854 m)  Weight:    87.998 kg (194 lb)  SpO2: 98% 100% 99% 99%    General exam: Pleasant middle-aged male lying comfortably supine in bed  Respiratory system: Clear. No increased work of breathing.ICD box left upper anterior chest. Left upper extremity in sling.  Cardiovascular system: S1 & S2 heard, RRR. No JVD, murmurs, gallops, clicks or pedal edema.Telemetry: Sinus rhythm with bundle branch morphology in 60s.  Gastrointestinal system: Abdomen is nondistended, soft and nontender. Normal bowel sounds heard. Central nervous system: Alert and oriented. No focal neurological deficits. Extremities: Symmetric 5 x 5 power.  Discharge Instructions      Discharge Instructions    (HEART FAILURE PATIENTS) Call MD:  Anytime you have any of the following symptoms: 1) 3 pound weight gain in 24 hours or 5 pounds in 1 week 2) shortness of breath, with or without a dry hacking cough 3) swelling in the hands, feet or stomach 4) if you have to sleep on extra pillows at night in order to breathe.    Complete by:  As directed      Call MD  for:  difficulty breathing, headache or visual disturbances    Complete by:  As directed      Call MD for:  extreme fatigue    Complete by:  As directed      Call MD for:  persistant dizziness or light-headedness    Complete by:  As directed      Call MD for:  persistant nausea and vomiting    Complete by:  As directed      Call MD for:  redness, tenderness, or signs of infection (pain, swelling, redness, odor or green/yellow discharge around incision site)    Complete by:  As directed      Call MD for:  severe uncontrolled pain    Complete by:  As directed      Call MD for:   temperature >100.4    Complete by:  As directed      Diet - low sodium heart healthy    Complete by:  As directed      Driving Restrictions    Complete by:  As directed   No driving x 6 months (pt aware)  Wound care discussed with patient     Increase activity slowly    Complete by:  As directed             Medication List    STOP taking these medications        metoprolol tartrate 25 MG tablet  Commonly known as:  LOPRESSOR      TAKE these medications        acetaminophen 325 MG tablet  Commonly known as:  TYLENOL  Take 1-2 tablets (325-650 mg total) by mouth every 4 (four) hours as needed for mild pain, moderate pain or fever.     aspirin EC 81 MG tablet  Take 81 mg by mouth daily.     atorvastatin 40 MG tablet  Commonly known as:  LIPITOR  Take 1 tablet (40 mg total) by mouth daily at 6 PM.     betamethasone valerate ointment 0.1 %  Commonly known as:  VALISONE  Apply 1 application topically 2 (two) times daily.     carvedilol 6.25 MG tablet  Commonly known as:  COREG  Take 1 tablet (6.25 mg total) by mouth 2 (two) times daily with a meal.     CENTRUM SILVER ADULT 50+ Tabs  Take 1 tablet by mouth every other day.     clonazePAM 1 MG tablet  Commonly known as:  KLONOPIN  TAKE ONE TABLET BY MOUTH TWICE DAILY AS NEEDED FOR ANXIETY     furosemide 20 MG tablet  Commonly known as:  LASIX  Take 1 tablet every Monday and Thursday     levothyroxine 137 MCG tablet  Commonly known as:  SYNTHROID, LEVOTHROID  Take 1 tablet (137 mcg total) by mouth daily before breakfast.     lisinopril 2.5 MG tablet  Commonly known as:  PRINIVIL,ZESTRIL  Take 1 tablet (2.5 mg total) by mouth daily.     mexiletine 150 MG capsule  Commonly known as:  MEXITIL  Take 1 capsule (150 mg total) by mouth 2 (two) times daily.     sertraline 50 MG tablet  Commonly known as:  ZOLOFT  TAKE ONE TABLET BY MOUTH ONCE DAILY       Follow-up Information    Follow up with Patsey Berthold,  NP On 01/13/2015.   Specialty:  Nurse Practitioner   Why:  at 3:30PM   Contact information:  [address] [place] [address] [phone]       Follow up with [name], MD. Schedule an appointment as soon as possible for a visit in 1 week.   Specialty:  Family Medicine   Why:  To be seen with repeat labs (CBC & BMP).   Contact information:   [place] [address] [phone]        The results of significant diagnostics from this hospitalization (including imaging, microbiology, ancillary and laboratory) are listed below for reference.    Significant Diagnostic Studies: Dg Chest 2 View  01/02/2015   CLINICAL DATA:  Status post internal cardiac defibrillator placement  EXAM: CHEST  2 VIEW  COMPARISON:  Radiograph 12/29/2014  FINDINGS: Left-sided ICD with single continuously overlies normal cardiac silhouette. Midline sternotomy noted. No pneumothorax. No pulmonary edema or pleural fluid.  IMPRESSION: No complication following left ICD placement.   Electronically Signed   By: Suzy Bouchard M.D.   On: 01/02/2015 10:55   Dg Chest 2 View  12/29/2014   CLINICAL DATA:  Chest pain and shortness of breath  EXAM: CHEST  2 VIEW  COMPARISON:  March 26, 2014  FINDINGS: Patient is status post median sternotomy. There is no edema or consolidation. The heart size and pulmonary vascularity are normal. No adenopathy. No bone lesions.  IMPRESSION: No edema or consolidation.   Electronically Signed   By: Lowella Grip III M.D.   On: 12/29/2014 19:57    Microbiology: Recent Results (from the past 240 hour(s))  MRSA PCR Screening     Status: None   Collection Time: 12/30/14  4:49 AM  Result Value Ref Range Status   MRSA by PCR NEGATIVE NEGATIVE Final    Comment:        The GeneXpert MRSA Assay (FDA approved for NASAL specimens only), is one component of a comprehensive MRSA colonization surveillance program. It is not intended to diagnose  MRSA infection nor to guide or monitor treatment for MRSA infections.   Culture, blood (routine x 2)     Status: None (Preliminary result)   Collection Time: 12/30/14  8:50 AM  Result Value Ref Range Status   Specimen Description BLOOD RIGHT HAND  Final   Special Requests BOTTLES DRAWN AEROBIC AND ANAEROBIC 6CC  Final   Culture   Final           BLOOD CULTURE RECEIVED NO GROWTH TO DATE CULTURE WILL BE HELD FOR 5 DAYS BEFORE ISSUING A FINAL NEGATIVE REPORT Performed at [facility]    Report Status PENDING  Incomplete  Culture, blood (routine x 2)     Status: None (Preliminary result)   Collection Time: 12/30/14  8:56 AM  Result Value Ref Range Status   Specimen Description BLOOD RIGHT ARM  Final   Special Requests BOTTLES DRAWN AEROBIC ONLY 10CC  Final   Culture   Final           BLOOD CULTURE RECEIVED NO GROWTH TO DATE CULTURE WILL BE HELD FOR 5 DAYS BEFORE ISSUING A FINAL NEGATIVE REPORT Performed at [facility]    Report Status PENDING  Incomplete     Labs: Basic Metabolic Panel:  Recent Labs Lab 12/29/14 1848 12/30/14 0049 12/30/14 0452 01/02/15 0355  NA 139  --  140 140  K 3.8  --  4.0 4.0  CL 107  --  106 106  CO2 26  --  24 26  GLUCOSE 93  --  101*  103*  BUN 20  --  16 17  CREATININE 1.29*  --  1.19 1.12  CALCIUM 9.1  --  8.6* 8.7*  MG  --  2.1  --   --    Liver Function Tests:  Recent Labs Lab 12/29/14 1848  AST 29  ALT 24  ALKPHOS 46  BILITOT 0.7  PROT 6.0*  ALBUMIN 3.7    Recent Labs Lab 12/30/14 0049  LIPASE 24   No results for input(s): AMMONIA in the last 168 hours. CBC:  Recent Labs Lab 12/29/14 1848 12/30/14 0452 01/02/15 0355  WBC 6.5 5.0 7.0  NEUTROABS 4.3  --   --   HGB 13.9 13.2 13.7  HCT 41.1 39.0 40.6  MCV 88.6 88.4 89.0  PLT 146* 152 129*   Cardiac Enzymes:  Recent Labs Lab 12/30/14 0049 12/30/14 0452 12/30/14 1106  TROPONINI 0.03 0.04* <0.03   BNP: BNP (last 3 results)  Recent Labs   12/30/14 0452  BNP 550.5*    ProBNP (last 3 results)  Recent Labs  01/06/14 1815  PROBNP 9238.0*    CBG:  Recent Labs Lab 12/31/14 0837 01/01/15 1657 01/01/15 2114 01/02/15 0728 01/02/15 1146  GLUCAP 99 109* 91 105* 118*        Signed:  Vernell Leep, MD, FACP, FHM. [facility] Pager [phone]  If 7PM-7AM, please contact night-coverage [email or web address] Password [email or web address] 01/02/2015, 1:36 PM

## 2015-01-02 NOTE — Progress Notes (Signed)
Subjective:  Tolerated cath which showed normal coronary arteries yesterday and good valve function.  Tolerated ICD and has had no VT overnight.  Objective:  Vital Signs in the last 24 hours: BP 119/80 mmHg  Pulse 63  Temp(Src) 98.5 F (36.9 C) (Oral)  Resp 16  Ht 6\' 1"  (1.854 m)  Wt 87.998 kg (194 lb)  BMI 25.60 kg/m2  SpO2 99%  Physical Exam: Pleasant white male in no acute distress Lungs:  Clear  Chest: Defibrillator implant site clean and dry, bandage was not removed.   Cardiac:  Regular rhythm, normal S1 and S2, no S3, 2/6 systolic murmur Abdomen:  Soft, nontender, no masses Extremities:  No edema present  Intake/Output from previous day: 06/03 0701 - 06/04 0700 In: 1000 [I.V.:900; IV Piggyback:100] Out: 1125 [Urine:1125] Weight Filed Weights   12/31/14 0500 01/01/15 0600 01/02/15 0500  Weight: 87.317 kg (192 lb 8 oz) 87.59 kg (193 lb 1.6 oz) 87.998 kg (194 lb)    Lab Results: Basic Metabolic Panel:  Recent Labs  01/02/15 0355  NA 140  K 4.0  CL 106  CO2 26  GLUCOSE 103*  BUN 17  CREATININE 1.12    CBC:  Recent Labs  01/02/15 0355  WBC 7.0  HGB 13.7  HCT 40.6  MCV 89.0  PLT 129*    BNP    Component Value Date/Time   BNP 550.5* 12/30/2014 0452    Telemetry: No VT overnight, sinus rhythm,  Assessment/Plan:  1.  Recent monomorphic ventricular tachycardia currently suppressed on mexiletine 2.  Prior Bentall procedure 3.  Cardiomyopathy -nonischemic 4.  Combined systolic and diastolic heart failure compensated  Recommendations:  We'll check with the EP but probably can go home today.  He will need to continue his prior medications but be discharged on mexiletine.       Kerry Hough  MD [facility] Cardiology  01/02/2015, 11:04 AM

## 2015-01-02 NOTE — Progress Notes (Addendum)
Doing well s/p ICD CXR reveals stable lead, no ptx Device interrogation is reviewed and normal  Would discharge today on current doses of lisinopril, coreg, and mexiletine. I will arrange follow-up with EP NP In 7-10 days and with Dr Caryl Comes in 3 months  No driving x 6 months (pt aware)  Wound care discussed with patient  OK to discharge to home from EP standpoint.  Electrophysiology team to see as needed while here. Please call with questions.   Thompson Grayer MD, [facility] 01/02/2015 11:34 AM

## 2015-01-02 NOTE — Discharge Instructions (Addendum)
Nonspecific Tachycardia Tachycardia is a faster than normal heartbeat (more than 100 beats per minute). In adults, the heart normally beats between 60 and 100 times a minute. A fast heartbeat may be a normal response to exercise or stress. It does not necessarily mean that something is wrong. However, sometimes when your heart beats too fast it may not be able to pump enough blood to the rest of your body. This can result in chest pain, shortness of breath, dizziness, and even fainting. Nonspecific tachycardia means that the specific cause or pattern of your tachycardia is unknown. CAUSES  Tachycardia may be harmless or it may be due to a more serious underlying cause. Possible causes of tachycardia include:  Exercise or exertion.  Fever.  Pain or injury.  Infection.  Loss of body fluids (dehydration).  Overactive thyroid.  Lack of red blood cells (anemia).  Anxiety and stress.  Alcohol.  Caffeine.  Tobacco products.  Diet pills.  Illegal drugs.  Heart disease. SYMPTOMS  Rapid or irregular heartbeat (palpitations).  Suddenly feeling your heart beating (cardiac awareness).  Dizziness.  Tiredness (fatigue).  Shortness of breath.  Chest pain.  Nausea.  Fainting. DIAGNOSIS  Your caregiver will perform a physical exam and take your medical history. In some cases, a heart specialist (cardiologist) may be consulted. Your caregiver may also order:  Blood tests.  Electrocardiography. This test records the electrical activity of your heart.  A heart monitoring test. TREATMENT  Treatment will depend on the likely cause of your tachycardia. The goal is to treat the underlying cause of your tachycardia. Treatment methods may include:  Replacement of fluids or blood through an intravenous (IV) tube for moderate to severe dehydration or anemia.  New medicines or changes in your current medicines.  Diet and lifestyle changes.  Treatment for certain  infections.  Stress relief or relaxation methods. HOME CARE INSTRUCTIONS   Rest.  Drink enough fluids to keep your urine clear or pale yellow.  Do not smoke.  Avoid:  Caffeine.  Tobacco.  Alcohol.  Chocolate.  Stimulants such as over-the-counter diet pills or pills that help you stay awake.  Situations that cause anxiety or stress.  Illegal drugs such as marijuana, phencyclidine (PCP), and cocaine.  Only take medicine as directed by your caregiver.  Keep all follow-up appointments as directed by your caregiver. SEEK IMMEDIATE MEDICAL CARE IF:   You have pain in your chest, upper arms, jaw, or neck.  You become weak, dizzy, or feel faint.  You have palpitations that will not go away.  You vomit, have diarrhea, or pass blood in your stool.  Your skin is cool, pale, and wet.  You have a fever that will not go away with rest, fluids, and medicine. MAKE SURE YOU:   Understand these instructions.  Will watch your condition.  Will get help right away if you are not doing well or get worse. Document Released: 08/24/2004 Document Revised: 10/09/2011 Document Reviewed: 06/27/2011 [facility] Patient Information 2015 [place], [place]. This information is not intended to replace advice given to you by your health care provider. Make sure you discuss any questions you have with your health care provider.   Cardioverter Defibrillator Implantation, Care After Refer to this sheet in the next few weeks. These instructions provide you with information on caring for yourself after your procedure. Your health care provider may also give you more specific instructions. Your treatment has been planned according to current medical practices, but problems sometimes occur. Call your health care  provider if you have any problems or questions after your procedure.  WHAT TO EXPECT AFTER THE PROCEDURE  You may feel pain. Some pain is normal. It may last a few days.  A slight bump may be  seen over the skin where the device was placed. Sometimes, it is possible to feel the device under the skin. This is normal.  In the months and years afterward, your health care provider will check the device, the leads, and the battery every few months. Eventually, when the battery is low, the device will be replaced. HOME CARE INSTRUCTIONS  Medicines  Take medicines only as directed by your health care provider.  If you were prescribed an antibiotic medicine, finish it all even if you start to feel better.   Do not take any other medicines without asking your health care provider first. Some medicines, including certain painkillers, can cause bleeding after surgery.  Wound Care  Do not remove the bandage on your chest until directed to do so by your health care provider.  Once your bandage is removed, you may see pieces of tape called skin adhesive strips over the area where the cut was made (incision site). Let them fall off on their own.   Check the incision site every day to make sure it is not infected, bleeding, or starting to pull apart.  Do not use lotions or ointments near the incision site unless directed to do so.   Keep the incision area clean and dry for 2-3 days after the procedure or as directed by your health care provider. It takes several weeks for the incision site to completely heal.   Do not take baths, swim, or use a hot tub until your health care provider approves. Activities  Try to walk a little every day. Exercising is important after this procedure. It is also important to use your shoulder on the side of the pacemaker in daily tasks that do not require exaggerated motion.  Avoid sudden jerking, pulling, or chopping movements that pull your upper arm far away from your body for at least 6 weeks.  Do not lift your upper arm above your shoulders for at least 6 weeks. This means no tennis, golf, or swimming for this period of time. If you sleep with the  arm above your head, use a restraint to prevent this from happening as you sleep.  You may go back to work when your health care provider says it is okay. Check with your health care provider before you start to drive or play sports.  Other Instructions  Follow diet instructions if they were provided. You should be able to eat what you usually do right away, but you may need to limit your salt intake.   Weigh yourself every day. If you suddenly gain weight, fluid may be building up in your body.   Always carry your pacemaker identification card with you. The card should list the implant date, device model, and manufacturer. Consider wearing a medical alert bracelet or necklace.  Tell all health care providers that you have a pacemaker. This may prevent them from giving you a magnetic resonance imaging (MRI) scan because of the strong magnets used during that test.  If you must pass through a metal detector, quickly walk through it. Do not stop under the detector or stand near it.  Avoid places or objects with a strong electric or magnetic field, including:   Engineer, maintenance. When at the airport, let officials know  you have a pacemaker. Your ID card will let you be checked in a way that is safe for you and that will not damage your pacemaker. Also, do not let a security person wave a magnetic wand near your pacemaker. That can make it stop working.  Power plants.   Large electrical generators.   Radiofrequency transmission towers, such as cell phone and radio towers.   Do not use amateur (ham) radio equipment or electric (arc) welding torches. Some devices are safe to use if held at least 1 foot from your pacemaker. These include power tools, lawn mowers, and speakers. If you are unsure of whether something is safe to use, ask your health care provider.   You may safely use electric blankets, heating pads, computers, and microwave ovens.   When using your cell phone, hold  it to the ear opposite the pacemaker. Do not leave your cell phone in a pocket over the pacemaker.   Keep all follow-up visits as directed by your health care provider. This is how your health care provider makes sure your chest is healing the way it should. Ask your health care provider when you should come back to have your stitches or staples taken out.   Have your pacemaker checked every 3-6 months or as directed by your health care provider. Most pacemakers last for 4-8 years before a new one is needed. SEEK MEDICAL CARE IF:   You feel one shock in your chest.  You gain weight suddenly.   Your legs or feet swell more than they have before.   It feels like your heart is fluttering or skipping beats (heart palpitations).  You have a fever. SEEK IMMEDIATE MEDICAL CARE IF:   You have chest pain.  You feel more than one shock.  You feel more short of breath than you have felt before.  You feel more light-headed than you have felt before.  You have problems with your incision site, such as swelling or bleeding, or it starts to open up.   You notice signs of infection around your incision site. Watch for:   Warmth.   Redness.   Worsening pain.   Swelling.   Fluid leaking from the incision site.  Document Released: 02/03/2005 Document Revised: 12/01/2013 Document Reviewed: 08/07/2012 [facility] Patient Information 2015 [place], [place]. This information is not intended to replace advice given to you by your health care provider. Make sure you discuss any questions you have with your health care provider.   Cath Site Care Refer to this sheet in the next few weeks. These instructions provide you with information on caring for yourself after your procedure. Your caregiver may also give you more specific instructions. Your treatment has been planned according to current medical practices, but problems sometimes occur. Call your caregiver if you have any problems or  questions after your procedure. HOME CARE INSTRUCTIONS You may shower the day after the procedure.Remove the bandage (dressing) and gently wash the site with plain soap and water.Gently pat the site dry. Do not apply powder or lotion to the site. Do not submerge the affected site in water for 3 to 5 days. Inspect the site at least twice daily. Do not flex or bend the affected arm for 24 hours. No lifting over 5 pounds (2.3 kg) for 5 days after your procedure. Do not drive home if you are discharged the same day of the procedure. Have someone else drive you. You may drive 24 hours after the procedure unless otherwise  instructed by your caregiver. Do not operate machinery or power tools for 24 hours. A responsible adult should be with you for the first 24 hours after you arrive home. What to expect: Any bruising will usually fade within 1 to 2 weeks. Blood that collects in the tissue (hematoma) may be painful to the touch. It should usually decrease in size and tenderness within 1 to 2 weeks. SEEK IMMEDIATE MEDICAL CARE IF: You have unusual pain at the radial site. You have redness, warmth, swelling, or pain at the radial site. You have drainage (other than a small amount of blood on the dressing). You have chills. You have a fever or persistent symptoms for more than 72 hours. You have a fever and your symptoms suddenly get worse. Your arm becomes pale, cool, tingly, or numb. You have heavy bleeding from the site. Hold pressure on the site. Document Released: 08/19/2010 Document Revised: 10/09/2011 Document Reviewed: 08/19/2010  [facility] Patient Information 2015 [place], [place]. This information is not intended to replace advice given to you by your health care provider. Make sure you discuss any questions you have with your health care provider.

## 2015-01-02 NOTE — Progress Notes (Signed)
[facility] Infectious Disease Progress Note  Subjective:  Pt s/p LHC and ICD placement on 06/03. No acute events overnight. Mr. To was seen and examined this morning.  He says everything went well yesterday.  He has not experienced any chest pain, dyspnea or lightheadedness since admission.  He has remained afebrile.  Objective: Vital signs in last 24 hours: Filed Vitals:   01/01/15 2300 01/02/15 0200 01/02/15 0400 01/02/15 0500  BP: 125/86 103/67 113/73 119/80  Pulse: 63 71 61 63  Temp:    98.5 F (36.9 C)  TempSrc:    Oral  Resp: 16 24 14 16   Height:    6\' 1"  (1.854 m)  Weight:    194 lb (87.998 kg)  SpO2: 98% 100% 99% 99%   Weight change: 14.4 oz (0.408 kg)  Intake/Output Summary (Last 24 hours) at 01/02/15 0935 Last data filed at 01/02/15 0811  Gross per 24 hour  Intake   1000 ml  Output   1325 ml  Net   -325 ml   General: resting in bed in NAD HEENT: [place]/AT Cardiac: RRR, no rubs or gallops; [id number] systolic murmur; ICD insertion site with small bruise, minimally TTP. Pulm: clear to auscultation bilaterally, moving normal volumes of air Abd: soft, nontender, nondistended, BS present Ext: warm and well perfused, no pedal edema; right femoral cath site is clean, non-tender, no palpable thrill Skin:  ? Splinter hemorrhage on right second fingernail Neuro: alert and oriented X3, responds appropriately, MAE spontaneously  Lab Results: Basic Metabolic Panel:  Recent Labs Lab 12/30/14 0049 12/30/14 0452 01/02/15 0355  NA  --  140 140  K  --  4.0 4.0  CL  --  106 106  CO2  --  24 26  GLUCOSE  --  101* 103*  BUN  --  16 17  CREATININE  --  1.19 1.12  CALCIUM  --  8.6* 8.7*  MG 2.1  --   --    Liver Function Tests:  Recent Labs Lab 12/29/14 1848  AST 29  ALT 24  ALKPHOS 46  BILITOT 0.7  PROT 6.0*  ALBUMIN 3.7    Recent Labs Lab 12/30/14 0049  LIPASE 24   CBC:  Recent Labs Lab 12/29/14 1848 12/30/14 0452 01/02/15 0355  WBC 6.5 5.0 7.0  NEUTROABS  4.3  --   --   HGB 13.9 13.2 13.7  HCT 41.1 39.0 40.6  MCV 88.6 88.4 89.0  PLT 146* 152 129*   Cardiac Enzymes:  Recent Labs Lab 12/30/14 0049 12/30/14 0452 12/30/14 1106  TROPONINI 0.03 0.04* <0.03   CBG:  Recent Labs Lab 12/30/14 0745 12/31/14 0837 01/01/15 1657 01/01/15 2114 01/02/15 0728  GLUCAP 79 99 109* 91 105*   Fasting Lipid Panel:  Recent Labs Lab 12/30/14 0452  CHOL 168  HDL 43  LDLCALC 107*  TRIG 89  CHOLHDL 3.9   Thyroid Function Tests:  Recent Labs Lab 12/30/14 0452 12/31/14 0855 01/01/15 0318  TSH 8.262*  --   --   FREET4  --  0.98  --   T3FREE  --   --  2.3   Coagulation:  Recent Labs Lab 12/30/14 0049  LABPROT 14.5  INR 1.12   Micro Results: Recent Results (from the past 240 hour(s))  MRSA PCR Screening     Status: None   Collection Time: 12/30/14  4:49 AM  Result Value Ref Range Status   MRSA by PCR NEGATIVE NEGATIVE Final    Comment:  The GeneXpert MRSA Assay (FDA approved for NASAL specimens only), is one component of a comprehensive MRSA colonization surveillance program. It is not intended to diagnose MRSA infection nor to guide or monitor treatment for MRSA infections.   Culture, blood (routine x 2)     Status: None (Preliminary result)   Collection Time: 12/30/14  8:50 AM  Result Value Ref Range Status   Specimen Description BLOOD RIGHT HAND  Final   Special Requests BOTTLES DRAWN AEROBIC AND ANAEROBIC 6CC  Final   Culture   Final           BLOOD CULTURE RECEIVED NO GROWTH TO DATE CULTURE WILL BE HELD FOR 5 DAYS BEFORE ISSUING A FINAL NEGATIVE REPORT Performed at [facility]    Report Status PENDING  Incomplete  Culture, blood (routine x 2)     Status: None (Preliminary result)   Collection Time: 12/30/14  8:56 AM  Result Value Ref Range Status   Specimen Description BLOOD RIGHT ARM  Final   Special Requests BOTTLES DRAWN AEROBIC ONLY 10CC  Final   Culture   Final           BLOOD CULTURE  RECEIVED NO GROWTH TO DATE CULTURE WILL BE HELD FOR 5 DAYS BEFORE ISSUING A FINAL NEGATIVE REPORT Performed at [facility]    Report Status PENDING  Incomplete   Studies/Results: No results found. Medications: I have reviewed the patient's current medications. Scheduled Meds: . aspirin EC  81 mg Oral Daily  . atorvastatin  40 mg Oral q1800  . carvedilol  6.25 mg Oral BID WC  .  ceFAZolin (ANCEF) IV  1 g Intravenous Q6H  . levothyroxine  137 mcg Oral QAC breakfast  . lisinopril  2.5 mg Oral Daily  . mexiletine  150 mg Oral Q12H  . multivitamin with minerals  1 tablet Oral QODAY  . mupirocin ointment   Nasal BID  . pantoprazole  40 mg Oral Q1200  . sertraline  50 mg Oral Daily  . sodium chloride  3 mL Intravenous Q12H  . sodium chloride  3 mL Intravenous Q12H  . triamcinolone cream   Topical BID   Continuous Infusions: . 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 He received Hibiclens baths and nasal mupirocen in attempt to decolonize prior to ICD placement.  ICD was placed yesterday.  Site with appropriate mild bruising post-op.  He was advised to seek emergency care if he develops severe pain, warmth, redness or discharge at site of ICD placement.  D/C planning per cardiology/EP.  Sustained VTach: s/p ICD placement on 06/03.    LOS: 4 days   Francesca Oman, DO IMTS PGY2 on ID Consult Service 01/02/2015, 9:35 AM

## 2015-01-04 ENCOUNTER — Telehealth: Payer: Self-pay | Admitting: Cardiology

## 2015-01-04 ENCOUNTER — Encounter (HOSPITAL_COMMUNITY): Payer: Self-pay | Admitting: Cardiovascular Disease

## 2015-01-04 ENCOUNTER — Encounter: Payer: Self-pay | Admitting: Internal Medicine

## 2015-01-04 NOTE — Telephone Encounter (Signed)
LMOVM for pt to return call 

## 2015-01-05 ENCOUNTER — Encounter: Payer: Self-pay | Admitting: Internal Medicine

## 2015-01-05 ENCOUNTER — Encounter: Payer: Self-pay | Admitting: *Deleted

## 2015-01-05 ENCOUNTER — Other Ambulatory Visit: Payer: Self-pay | Admitting: Family Medicine

## 2015-01-05 ENCOUNTER — Telehealth: Payer: Self-pay | Admitting: *Deleted

## 2015-01-05 LAB — CULTURE, BLOOD (ROUTINE X 2)
Culture: NO GROWTH
Culture: NO GROWTH

## 2015-01-05 MED ORDER — RANOLAZINE ER 500 MG PO TB12: 500.0000 mg | ORAL_TABLET | Freq: Two times a day (BID) | ORAL | Status: DC

## 2015-01-05 MED ORDER — MEXILETINE HCL 150 MG PO CAPS: ORAL_CAPSULE | ORAL | Status: DC

## 2015-01-05 MED FILL — Heparin Sodium (Porcine) 2 Unit/ML in Sodium Chloride 0.9%: INTRAMUSCULAR | Qty: 1500 | Status: AC

## 2015-01-05 MED FILL — Heparin Sodium (Porcine) 2 Unit/ML in Sodium Chloride 0.9%: INTRAMUSCULAR | Qty: 500 | Status: AC

## 2015-01-05 NOTE — Telephone Encounter (Signed)
Error

## 2015-01-05 NOTE — Telephone Encounter (Signed)
Spoke w/ pt and informed him that he received painless therapy around 1AM on 01-04-15. Pt stated that he is not sure what he was doing around that time. He states that he feels fine, other than being cold and tired. Pt aware no driving X6 months. Pt aware that I am going to show episodes to MD and someone will be calling him back w/ recommendations. Pt verbalized understanding.

## 2015-01-05 NOTE — Telephone Encounter (Signed)
CALLED TO UPDATE PT ON MED CHANGES PER DR. KLEIN'S ORDERS.  SPOKE TO THE PT AND INFORMED HIM OF THE NEW MEDICATION CHANGE, SENT IN THE NEW MEDICATION.

## 2015-01-06 ENCOUNTER — Other Ambulatory Visit: Payer: Self-pay

## 2015-01-06 ENCOUNTER — Encounter: Payer: Self-pay | Admitting: Internal Medicine

## 2015-01-06 MED ORDER — MEXILETINE HCL 150 MG PO CAPS: ORAL_CAPSULE | ORAL | Status: DC

## 2015-01-06 NOTE — Telephone Encounter (Signed)
Last visit 12/07/14 Last refill 10/19/14 #15 0 refill

## 2015-01-06 NOTE — Telephone Encounter (Signed)
Refill once.  Try to avoid regular use. 

## 2015-01-06 NOTE — Telephone Encounter (Signed)
Patient 's wife called stating that patient could not get a refill on Mexiletine. I called the pharmacy and they stated that  he had it refill on 01/03/15 and  The insurance would not pay for it until 01/16/15. Called the wife to let her know and she verbally understood

## 2015-01-07 NOTE — Telephone Encounter (Signed)
Rx called in 

## 2015-01-10 ENCOUNTER — Encounter: Payer: Self-pay | Admitting: Nurse Practitioner

## 2015-01-10 NOTE — Progress Notes (Signed)
Electrophysiology Office Note Date: 01/13/2015  ID:  Bradley [name], DOB 07-09-1956, MRN [id number]  PCP: Bradley Post, MD Primary Cardiologist: Bradley [name] Electrophysiologist: Bradley [name]  CC: ICD [name] hospital follow up  Bradley [name] is a 60 y.o. male seen today for Dr Bradley [name].  He has a complicated past medical history including thyroid cancer (s/p thyroidectomy), hypertension, depression, and aortic stenosis for which he underwent aortic vavulotomy at age 21 with subsequent Bentall procedure in 2001 with a pericardial tissue valve.  He had MSSA bacteremia and bacterial endocarditis in [id number] complicated by renal failure requiring dialysis for a short period of time.  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  He was admitted earlier this month with progressive episodes of palpitations and pre-syncope. He was found to have monomorphic ventricular tachycardia. Catheterization demonstrated normal coronaries.  Echocardiogram demonstrated EF 30-35% and he underwent STJ dual chamber ICD implant.  Mexiletine was added prior to discharge and his Metoprolol was changed to Coreg.  He had subsequent ventricular arrhythmias [name] discharge and his Mexelitine was increased with no further VT.  He presents today for [name] hospital electrophysiology followup.  Since discharge, the patient reports doing reasonably well. He denies chest pain, palpitations, dyspnea, PND, orthopnea, nausea, vomiting, dizziness, syncope, edema, weight gain, or early satiety.  He has not had ICD shocks.   Device History: STJ dual chamber ICD implanted 2016 for secondary prevention (VT in the setting of non-ischemic cardiomyopathy) History of appropriate therapy: no History of AAD therapy: yes - Mexiletine   Past Medical History  Diagnosis Date  . Anxiety   . Depression   . Hypertension   .  Hypothyroidism, postsurgical   . Aortic stenosis     s/p Bentall with bioprosthetic AVR 02/2010; Last echo (9/11): Moderate LVH, EF 45-50%, AVR functioning appropriately, aortic valve mean gradient 21, diastolic dysfunction. Chest MRA (2/13): Mild to moderate dilatation at the sinus of Valsalva at 4.1 cm, mild dilatation ascending aorta distal to the tube graft at 3.9 cm, moderate dilatation of the innominate artery a 2.1 cm;    . Hx of cardiac cath     a. LHC in 02/2010: normal cors  . Hx of echocardiogram 2014    Echo (10/14): Severe LVH, EF 60-65%, normal wall motion, grade 1 diastolic dysfunction, AVR functioning normally, mild aortic stenosis (mean 19), moderately dilated aorta, mild LAE, mild RVE  . Staphylococcus aureus bacteremia   . Prosthetic valve endocarditis   . Thyroid cancer   . ESRD on dialysis     a. 09/2013 felt to be related to gentamycin b. no longer requiring HD  . Hx of echocardiogram 02/2014    Echo (8/15):  Mod LV, EF 35-40%, Gr 1 DD, AVR ok (mean 14 mmHg), mild LAE  . Ventricular tachycardia     a. s/p STJ ICD   Past Surgical History  Procedure Laterality Date  . Cardiac catheterization  03/02/2010    NORMAL CORONARY ARTERY  . Sternotomy      REDO  . Transthoracic echocardiogram  03/2010    SHOWED MILD REDUCTION OF LV FUNCTION  . Thyroidectomy  ~ 2005  . Shoulder arthroscopy w/ rotator cuff repair Right 2012  . Av fistula placement Left 10/08/2013    Procedure: ARTERIOVENOUS (AV) FISTULA CREATION- LEFT ARM; Radial Cephalic ;  Surgeon: Bradley [name], MD;  Location: [name];  Service: Vascular;  Laterality:  Left;  . Aortic valve replacement  2011  . Aortic valve repair  1968  . Tee without cardioversion N/A 10/24/2013    Procedure: TRANSESOPHAGEAL ECHOCARDIOGRAM (TEE);  Surgeon: Bradley Spark, MD;  Location: [name];  Service: Cardiovascular;  Laterality: N/A;  Bradley [name] procedure N/A 10/28/2013    Procedure: REDO BENTALL PROCEDURE, debridment of aoritc root  abscess, replacement of aortic root, ascending aorta and aortic valve with homograft. Insertion of left femoral arterial line;  Surgeon: Bradley Isaac, MD;  Location: [name];  Service: Open Heart Surgery;  Laterality: N/A;  . Intraoperative transesophageal echocardiogram N/A 10/28/2013    Procedure: INTRAOPERATIVE TRANSESOPHAGEAL ECHOCARDIOGRAM;  Surgeon: Bradley Isaac, MD;  Location: [name];  Service: Open Heart Surgery;  Laterality: N/A;  . Cardiac valve replacement    . Cardiac catheterization N/A 01/01/2015    Procedure: Coronary/Graft Angiography;  Surgeon: Bradley Sine, MD;  Location: [name];  Service: Cardiovascular;  Laterality: N/A;  . Peripheral vascular catheterization N/A 01/01/2015    Procedure: Aortic Arch Angiography;  Surgeon: Bradley Sine, MD;  Location: [name];  Service: Cardiovascular;  Laterality: N/A;  . Ep implantable device N/A 01/01/2015    Procedure: ICD Implant;  Surgeon: Bradley Sprang, MD;  Location: [name];  Service: Cardiovascular;  Laterality: N/A;    Current Outpatient Prescriptions  Medication Sig Dispense Refill  . acetaminophen (TYLENOL) 325 MG tablet Take 1-2 tablets (325-650 mg total) by mouth every 4 (four) hours as needed for mild pain, moderate pain or fever.    Marland Kitchen aspirin EC 81 MG tablet Take 81 mg by mouth daily.     Marland Kitchen atorvastatin (LIPITOR) 40 MG tablet Take 1 tablet (40 mg total) by mouth daily at 6 PM. 30 tablet 0  . betamethasone valerate ointment (VALISONE) 0.1 % Apply 1 application topically 2 (two) times daily. (Patient taking differently: Apply 1 application topically as needed (FOR FOREHEAD, NOSE AREA). ) 30 g 0  . carvedilol (COREG) 6.25 MG tablet Take 1 tablet (6.25 mg total) by mouth 2 (two) times daily with a meal. 60 tablet 0  . clonazePAM (KLONOPIN) 1 MG tablet TAKE ONE TABLET BY MOUTH TWICE DAILY AS NEEDED FOR ANXIETY 15 tablet 0  . furosemide (LASIX) 20 MG tablet Take 1 tablet every Monday and  Thursday (Patient taking differently: Take 20 mg by mouth 2 (two) times a week. Take 1 tablet every Monday and Thursday) 30 tablet 11  . levothyroxine (SYNTHROID, LEVOTHROID) 137 MCG tablet Take 1 tablet (137 mcg total) by mouth daily before breakfast. 90 tablet 0  . lisinopril (PRINIVIL,ZESTRIL) 2.5 MG tablet Take 1 tablet (2.5 mg total) by mouth daily. 90 tablet 3  . metoprolol tartrate (LOPRESSOR) 25 MG tablet Take 1 tablet by mouth daily.    Marland Kitchen mexiletine (MEXITIL) 150 MG capsule TAKE 2 TABLETS BY MOUTH IN THE AM & 2 TABLETS BY MOUTH IN THE PM. 120 capsule 1  . Multiple Vitamins-Minerals (CENTRUM SILVER ADULT 50+) TABS Take 1 tablet by mouth every other day.     . ranolazine (RANEXA) 500 MG 12 hr tablet Take 1 tablet (500 mg total) by mouth 2 (two) times daily. 30 tablet 1  . sertraline (ZOLOFT) 50 MG tablet TAKE ONE TABLET BY MOUTH ONCE DAILY 30 tablet 5   No current facility-administered medications for this visit.    Allergies:   Oxycodone and Rifampin   Social History: History   Social History  .  Marital Status: Married    Spouse Name: N/A  . Number of Children: N/A  . Years of Education: N/A   Occupational History  . Not on file.   Social History Main Topics  . Smoking status: Never Smoker   . Smokeless tobacco: Never Used  . Alcohol Use: No     Comment: Occasional  . Drug Use: No  . Sexual Activity: No   Other Topics Concern  . Not on file   Social History Narrative   Lives at home with walker.  Does not drive.  ESRD T, H, Sa.  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    Family History: Family History  Problem Relation Age of Onset  . Heart disease Father   . Thyroid cancer Sister   . Thyroid cancer Sister   . Kidney Stones Sister     Review of Systems: All other systems reviewed and are otherwise negative except as noted above.   Physical Exam: VS:  BP 118/72 mmHg  Pulse 60  Ht 6\' 1"  (1.854 m)  Wt 194 lb (87.998 kg)  BMI 25.60 kg/m2 , BMI Body mass index is  25.6 kg/(m^2).  GEN- The patient is chronically ill appearing, alert and oriented x 3 today.   HEENT: normocephalic, atraumatic; sclera clear, conjunctiva pink; hearing intact; oropharynx clear; neck supple, no JVP Lymph- no cervical lymphadenopathy Lungs- Clear to ausculation bilaterally, normal work of breathing.  No wheezes, rales, rhonchi Heart- Regular rate and rhythm, no murmurs, rubs or gallops  GI- soft, non-tender, non-distended, bowel sounds present Extremities- no clubbing, cyanosis, or edema; DP/PT/radial pulses 2+ bilaterally MS- no significant deformity or atrophy Skin- warm and dry, no rash or lesion; ICD pocket well healed (small hematoma) Psych- euthymic mood, full affect Neuro- strength and sensation are intact  ICD interrogation- reviewed in detail today,  See PACEART report  EKG:  EKG is ordered today. The ekg ordered today shows sinus rhythm, rate 60, prior inferior infarct, normal intervals  Recent Labs: 12/29/2014: ALT 24 12/30/2014: B Natriuretic Peptide 550.5*; Magnesium 2.1; TSH 8.262* 01/02/2015: BUN 17; Creatinine, Ser 1.12; Hemoglobin 13.7; Platelets 129*; Potassium 4.0; Sodium 140   Wt Readings from Last 3 Encounters:  01/13/15 194 lb (87.998 kg)  01/02/15 194 lb (87.998 kg)  12/21/14 191 lb 1.6 oz (86.682 kg)     Other studies Reviewed: Additional studies/ records that were reviewed today include: hospital records  Assessment and Plan:  1.  Ventricular tachycardia/non-ischemic cardiomyopathy Normal ICD function, wound well healed No recurrent VT since mexiletine increased  Continue Mexiletine, Ranexa and Coreg No driving x6 months (pt aware) If recurrent VT despite AAD therapy, would increase Ranexa to 1000mg  bid and if further recurrence, would refer for catheter ablation (discussed case with Dr Lovena Le today)  2.  Chronic systolic heart failure euvolemic today Stable on an appropriate medical regimen  3.  Hypertension Stable No change  required today  4.  Valvular heart disease s/p Bentall Follow up with Dr Bradley [name] as scheduled   Current medicines are reviewed at length with the patient today.   The patient does not have concerns regarding his medicines.  The following changes were made today:  none  Labs/ tests ordered today include:  Orders Placed This Encounter  Procedures  . EKG 12-Lead    Disposition:   Follow up with me in 4 weeks, follow up with Dr Bradley [name] as scheduled.    Signed, Chanetta Marshall, NP 01/13/2015 4:40 PM  [facility]  [address] [place] [address] [phone] (office) [phone] (fax  Addendum: After visit, as patient was receiving instructions, he had a vagal episode and became diaphoretic, nauseated and bradycardic.  He was laid flat and his condition gradually improved.  He was monitored for another 45 minutes in the office after normalization of HR and BP.  He was advised to stay adequately hydrated tonight. Dr Lovena Le evaluated the patient during the episode as well.

## 2015-01-13 ENCOUNTER — Encounter: Payer: Self-pay | Admitting: Nurse Practitioner

## 2015-01-13 ENCOUNTER — Ambulatory Visit (INDEPENDENT_AMBULATORY_CARE_PROVIDER_SITE_OTHER): Payer: Commercial Managed Care - HMO | Admitting: Nurse Practitioner

## 2015-01-13 VITALS — BP 118/72 | HR 60 | Ht 73.0 in | Wt 194.0 lb

## 2015-01-13 DIAGNOSIS — I10 Essential (primary) hypertension: Secondary | ICD-10-CM | POA: Diagnosis not present

## 2015-01-13 DIAGNOSIS — I472 Ventricular tachycardia, unspecified: Secondary | ICD-10-CM

## 2015-01-13 DIAGNOSIS — I428 Other cardiomyopathies: Secondary | ICD-10-CM

## 2015-01-13 DIAGNOSIS — I429 Cardiomyopathy, unspecified: Secondary | ICD-10-CM | POA: Diagnosis not present

## 2015-01-13 DIAGNOSIS — Z95810 Presence of automatic (implantable) cardiac defibrillator: Secondary | ICD-10-CM | POA: Diagnosis not present

## 2015-01-13 DIAGNOSIS — I5022 Chronic systolic (congestive) heart failure: Secondary | ICD-10-CM | POA: Diagnosis not present

## 2015-01-13 LAB — CUP PACEART INCLINIC DEVICE CHECK
Battery Remaining Longevity: 92.4 mo
Brady Statistic RV Percent Paced: 0 %
Date Time Interrogation Session: 20160615173039
HighPow Impedance: 57.375
Lead Channel Impedance Value: 462.5 Ohm
Lead Channel Sensing Intrinsic Amplitude: 11.7 mV
Lead Channel Setting Pacing Amplitude: 3.5 V
Lead Channel Setting Pacing Pulse Width: 0.5 ms
Lead Channel Setting Sensing Sensitivity: 0.5 mV
Pulse Gen Serial Number: 7135169
Zone Setting Detection Interval: 250 ms
Zone Setting Detection Interval: 330 ms
Zone Setting Detection Interval: 425 ms

## 2015-01-13 NOTE — Patient Instructions (Signed)
Your physician recommends that you continue on your current medications as directed. Please refer to the Current Medication list given to you today.  Your physician recommends that you schedule a follow-up appointment in: 4 weeks with Chanetta Marshall, NP

## 2015-01-18 ENCOUNTER — Ambulatory Visit: Payer: Commercial Managed Care - HMO | Attending: Internal Medicine | Admitting: Physical Therapy

## 2015-01-18 DIAGNOSIS — R269 Unspecified abnormalities of gait and mobility: Secondary | ICD-10-CM

## 2015-01-18 NOTE — Therapy (Signed)
[facility] [address] [place], [place], [address] Phone: [phone]   Fax:  [phone]  Patient Details  Name: Bradley [name] MRN: [id number] Date of Birth: 1956/06/03 Referring Provider:  Fay Records, MD  Encounter Date: 01/18/2015  Pt. Arrived for  PT initial evaluation - accompanied by his wife; wife reports that pt has had a defibrillator implanted (first of June) since this PT referral was made in mid-May.  No new orders have been received for PT since this procedure - wife is very concerned that pt may not be able to tolerate PT at this time due to symptoms provoked with a cardiac functioning test done approx. 2 weeks ago in which pt. "Became nauseaus and lost color" and it was determined that his heart was only functioning at 35% - requests to hold on PT at this time. Agreed to hold PT eval - instructed pt and wife to discuss with cardiologist if PT referral is still appropriate pending s/p defibrillator implant or should pt be referred to cardiac rehab to address cardiovascular conditioning/activity tolerance.  Alda Lea, PT 01/18/2015, 12:08 PM  [facility] [address] [place], [place], [address] Phone: [phone]   Fax:  [phone]

## 2015-01-19 ENCOUNTER — Other Ambulatory Visit: Payer: Self-pay | Admitting: Internal Medicine

## 2015-01-19 MED ORDER — MEXILETINE HCL 150 MG PO CAPS: ORAL_CAPSULE | ORAL | Status: DC

## 2015-01-21 ENCOUNTER — Other Ambulatory Visit: Payer: Self-pay

## 2015-01-21 MED ORDER — CARVEDILOL 6.25 MG PO TABS: 6.2500 mg | ORAL_TABLET | Freq: Two times a day (BID) | ORAL | Status: DC

## 2015-01-30 ENCOUNTER — Telehealth: Payer: Self-pay | Admitting: Physician Assistant

## 2015-01-30 ENCOUNTER — Emergency Department (HOSPITAL_COMMUNITY)
Admission: EM | Admit: 2015-01-30 | Discharge: 2015-01-30 | Disposition: A | Discharge status: Home / Self Care | Payer: Commercial Managed Care - HMO | Attending: Emergency Medicine | Admitting: Emergency Medicine

## 2015-01-30 ENCOUNTER — Encounter (HOSPITAL_COMMUNITY): Payer: Self-pay | Admitting: *Deleted

## 2015-01-30 ENCOUNTER — Other Ambulatory Visit: Payer: Self-pay | Admitting: Physician Assistant

## 2015-01-30 DIAGNOSIS — I10 Essential (primary) hypertension: Secondary | ICD-10-CM | POA: Diagnosis not present

## 2015-01-30 DIAGNOSIS — Z79899 Other long term (current) drug therapy: Secondary | ICD-10-CM | POA: Diagnosis not present

## 2015-01-30 DIAGNOSIS — F329 Major depressive disorder, single episode, unspecified: Secondary | ICD-10-CM | POA: Diagnosis not present

## 2015-01-30 DIAGNOSIS — N289 Disorder of kidney and ureter, unspecified: Secondary | ICD-10-CM | POA: Insufficient documentation

## 2015-01-30 DIAGNOSIS — N186 End stage renal disease: Secondary | ICD-10-CM | POA: Diagnosis not present

## 2015-01-30 DIAGNOSIS — Z992 Dependence on renal dialysis: Secondary | ICD-10-CM | POA: Insufficient documentation

## 2015-01-30 DIAGNOSIS — Z8619 Personal history of other infectious and parasitic diseases: Secondary | ICD-10-CM | POA: Diagnosis not present

## 2015-01-30 DIAGNOSIS — I159 Secondary hypertension, unspecified: Secondary | ICD-10-CM

## 2015-01-30 DIAGNOSIS — Z7952 Long term (current) use of systemic steroids: Secondary | ICD-10-CM | POA: Insufficient documentation

## 2015-01-30 DIAGNOSIS — I120 Hypertensive chronic kidney disease with stage 5 chronic kidney disease or end stage renal disease: Secondary | ICD-10-CM | POA: Diagnosis not present

## 2015-01-30 DIAGNOSIS — Z7982 Long term (current) use of aspirin: Secondary | ICD-10-CM | POA: Insufficient documentation

## 2015-01-30 DIAGNOSIS — I472 Ventricular tachycardia: Secondary | ICD-10-CM | POA: Diagnosis not present

## 2015-01-30 DIAGNOSIS — F419 Anxiety disorder, unspecified: Secondary | ICD-10-CM | POA: Insufficient documentation

## 2015-01-30 DIAGNOSIS — E039 Hypothyroidism, unspecified: Secondary | ICD-10-CM | POA: Diagnosis not present

## 2015-01-30 DIAGNOSIS — Z85850 Personal history of malignant neoplasm of thyroid: Secondary | ICD-10-CM | POA: Insufficient documentation

## 2015-01-30 DIAGNOSIS — Z9889 Other specified postprocedural states: Secondary | ICD-10-CM | POA: Insufficient documentation

## 2015-01-30 LAB — CBC
HCT: 48.1 % (ref 39.0–52.0)
Hemoglobin: 16.2 g/dL (ref 13.0–17.0)
MCH: 30.5 pg (ref 26.0–34.0)
MCHC: 33.7 g/dL (ref 30.0–36.0)
MCV: 90.6 fL (ref 78.0–100.0)
Platelets: 143 10*3/uL — ABNORMAL LOW (ref 150–400)
RBC: 5.31 MIL/uL (ref 4.22–5.81)
RDW: 14.1 % (ref 11.5–15.5)
WBC: 7.3 10*3/uL (ref 4.0–10.5)

## 2015-01-30 LAB — BASIC METABOLIC PANEL
Anion gap: 9 (ref 5–15)
BUN: 16 mg/dL (ref 6–20)
CO2: 24 mmol/L (ref 22–32)
Calcium: 9.8 mg/dL (ref 8.9–10.3)
Chloride: 108 mmol/L (ref 101–111)
Creatinine, Ser: 1.37 mg/dL — ABNORMAL HIGH (ref 0.61–1.24)
GFR calc Af Amer: >60 mL/min (ref >60)
GFR calc non Af Amer: 56 mL/min — ABNORMAL LOW (ref >60)
Glucose, Bld: 95 mg/dL (ref 65–99)
Potassium: 4.1 mmol/L (ref 3.5–5.1)
Sodium: 141 mmol/L (ref 135–145)

## 2015-01-30 LAB — I-STAT TROPONIN, ED: Troponin i, poc: 0.01 ng/mL (ref 0.00–0.08)

## 2015-01-30 MED ORDER — CARVEDILOL 6.25 MG PO TABS: 6.2500 mg | ORAL_TABLET | Freq: Two times a day (BID) | ORAL | Status: DC

## 2015-01-30 MED ORDER — CARVEDILOL 6.25 MG PO TABS: ORAL_TABLET | ORAL | Status: DC

## 2015-01-30 NOTE — ED Provider Notes (Signed)
CSN: [id number]     Arrival date & time 01/30/15  1556 History   First MD Initiated Contact with Patient 01/30/15 1654     Chief Complaint  Patient presents with  . Hypertension      HPI Patient with reported elevated bp last night and today. Patient with reported periods of flushing and getting pale. Patient denies any chest pain, denies dizziness. No n/v. Patient is alert. Patient has been taking meds as directed. Patient did take an additional lasix today. Patient has hx of valve replacement and hx of valve infection. Patient has damage and hx of kidney failure. Patient also has defibrilator.  Past Medical History  Diagnosis Date  . Anxiety   . Depression   . Hypertension   . Hypothyroidism, postsurgical   . Aortic stenosis     s/p Bentall with bioprosthetic AVR 02/2010; Last echo (9/11): Moderate LVH, EF 45-50%, AVR functioning appropriately, aortic valve mean gradient 21, diastolic dysfunction. Chest MRA (2/13): Mild to moderate dilatation at the sinus of Valsalva at 4.1 cm, mild dilatation ascending aorta distal to the tube graft at 3.9 cm, moderate dilatation of the innominate artery a 2.1 cm;    . Hx of cardiac cath     a. LHC in 02/2010: normal cors  . Hx of echocardiogram 2014    Echo (10/14): Severe LVH, EF 60-65%, normal wall motion, grade 1 diastolic dysfunction, AVR functioning normally, mild aortic stenosis (mean 19), moderately dilated aorta, mild LAE, mild RVE  . Staphylococcus aureus bacteremia   . Prosthetic valve endocarditis   . Thyroid cancer   . ESRD on dialysis     a. 09/2013 felt to be related to gentamycin b. no longer requiring HD  . Hx of echocardiogram 02/2014    Echo (8/15):  Mod LV, EF 35-40%, Gr 1 DD, AVR ok (mean 14 mmHg), mild LAE  . Ventricular tachycardia     a. s/p STJ ICD   Past Surgical History  Procedure Laterality Date  . Cardiac catheterization  03/02/2010    NORMAL CORONARY ARTERY  . Sternotomy      REDO  . Transthoracic  echocardiogram  03/2010    SHOWED MILD REDUCTION OF LV FUNCTION  . Thyroidectomy  ~ 2005  . Shoulder arthroscopy w/ rotator cuff repair Right 2012  . Av fistula placement Left 10/08/2013    Procedure: ARTERIOVENOUS (AV) FISTULA CREATION- LEFT ARM; Radial Cephalic ;  Surgeon: Mal [name], MD;  Location: [facility];  Service: Vascular;  Laterality: Left;  . Aortic valve replacement  2011  . Aortic valve repair  1968  . Tee without cardioversion N/A 10/24/2013    Procedure: TRANSESOPHAGEAL ECHOCARDIOGRAM (TEE);  Surgeon: Dorothy Spark, MD;  Location: [facility] ENDOSCOPY;  Service: Cardiovascular;  Laterality: N/A;  Deneen Harts procedure N/A 10/28/2013    Procedure: REDO BENTALL PROCEDURE, debridment of aoritc root abscess, replacement of aortic root, ascending aorta and aortic valve with homograft. Insertion of left femoral arterial line;  Surgeon: [name], MD;  Location: [facility];  Service: Open Heart Surgery;  Laterality: N/A;  . Intraoperative transesophageal echocardiogram N/A 10/28/2013    Procedure: INTRAOPERATIVE TRANSESOPHAGEAL ECHOCARDIOGRAM;  Surgeon: [name], MD;  Location: [facility];  Service: Open Heart Surgery;  Laterality: N/A;  . Cardiac valve replacement    . Cardiac catheterization N/A 01/01/2015    Procedure: Coronary/Graft Angiography;  Surgeon: Troy Sine, MD;  Location: [facility];  Service: Cardiovascular;  Laterality: N/A;  . Peripheral  vascular catheterization N/A 01/01/2015    Procedure: Aortic Arch Angiography;  Surgeon: Troy Sine, MD;  Location: [facility];  Service: Cardiovascular;  Laterality: N/A;  . Ep implantable device N/A 01/01/2015    Procedure: ICD Implant;  Surgeon: Deboraha Sprang, MD;  Location: [facility];  Service: Cardiovascular;  Laterality: N/A;   Family History  Problem Relation Age of Onset  . Heart disease Father   . Thyroid cancer Sister   . Thyroid cancer Sister   . Kidney Stones Sister    History  Substance Use  Topics  . Smoking status: Never Smoker   . Smokeless tobacco: Never Used  . Alcohol Use: No     Comment: Occasional    Review of Systems  All other systems reviewed and are negative.     Allergies  Oxycodone and Rifampin  Home Medications   Prior to Admission medications   Medication Sig Start Date End Date Taking? Authorizing Provider  acetaminophen (TYLENOL) 325 MG tablet Take 1-2 tablets (325-650 mg total) by mouth every 4 (four) hours as needed for mild pain, moderate pain or fever. Patient taking differently: Take 325 mg by mouth daily as needed (pain).  01/02/15  Yes Modena Jansky, MD  aspirin EC 81 MG tablet Take 81 mg by mouth daily.    Yes Historical Provider, MD  atorvastatin (LIPITOR) 40 MG tablet Take 1 tablet (40 mg total) by mouth daily at 6 PM. 01/02/15  Yes Modena Jansky, MD  betamethasone valerate ointment (VALISONE) 0.1 % Apply 1 application topically 2 (two) times daily. Patient taking differently: Apply 1 application topically 2 (two) times daily as needed (rash, irritation).  07/06/14  Yes Fay Records, MD  clonazePAM (KLONOPIN) 1 MG tablet TAKE ONE TABLET BY MOUTH TWICE DAILY AS NEEDED FOR ANXIETY Patient taking differently: TAKE ONE TABLET BY MOUTH AT BEDTIME  AS NEEDED FOR ANXIETY/SLEEP 01/07/15  Yes [name], MD  furosemide (LASIX) 20 MG tablet Take 1 tablet every Monday and Thursday Patient taking differently: Take 20 mg by mouth 2 (two) times a week. Monday and Thursday 07/06/14  Yes Fay Records, MD  levothyroxine (SYNTHROID, LEVOTHROID) 137 MCG tablet Take 1 tablet (137 mcg total) by mouth daily before breakfast. 12/08/14  Yes [name], MD  lisinopril (PRINIVIL,ZESTRIL) 2.5 MG tablet Take 1 tablet (2.5 mg total) by mouth daily. 09/30/14  Yes Fay Records, MD  mexiletine (MEXITIL) 150 MG capsule TAKE 2 TABLETS BY MOUTH IN THE AM & 2 TABLETS BY MOUTH IN THE PM. Patient taking differently: Take 300 mg by mouth 2 (two) times daily.  01/19/15  Yes  Fay Records, MD  Multiple Vitamin (MULTIVITAMIN WITH MINERALS) TABS tablet Take 1 tablet by mouth every other day. Centrum Silver   Yes Historical Provider, MD  ranolazine (RANEXA) 500 MG 12 hr tablet Take 1 tablet (500 mg total) by mouth 2 (two) times daily. 01/05/15  Yes Deboraha Sprang, MD  sertraline (ZOLOFT) 50 MG tablet TAKE ONE TABLET BY MOUTH ONCE DAILY 11/02/14  Yes [name], MD  carvedilol (COREG) 6.25 MG tablet Take 1 tablet in the morning and 2 tablets at night before bedtime 01/30/15   Leonard Schwartz, MD   BP 135/91 mmHg  Pulse 60  Temp(Src) 98.4 F (36.9 C) (Oral)  Resp 17  Ht 6\' 1"  (1.854 m)  SpO2 100% Physical Exam  Constitutional: He is oriented to person, place, and time. He appears  well-developed and well-nourished. No distress.  HENT:  Head: Normocephalic and atraumatic.  Eyes: Pupils are equal, round, and reactive to light.  Neck: Normal range of motion.  Cardiovascular: Normal rate and intact distal pulses.   Pulmonary/Chest: No respiratory distress.  Abdominal: Normal appearance. He exhibits no distension.  Musculoskeletal: Normal range of motion.  Neurological: He is alert and oriented to person, place, and time. No cranial nerve deficit.  Skin: Skin is warm and dry. No rash noted.  Psychiatric: He has a normal mood and affect. His behavior is normal.  Nursing note and vitals reviewed.   ED Course  Procedures (including critical care time) Labs Review Labs Reviewed  CBC - Abnormal; Notable for the following:    Platelets 143 (*)    All other components within normal limits  BASIC METABOLIC PANEL - Abnormal; Notable for the following:    Creatinine, Ser 1.37 (*)    GFR calc non Af Amer 56 (*)    All other components within normal limits  I-STAT TROPOININ, ED    Imaging Review No results found.   EKG Interpretation   Date/Time:  Saturday January 30 2015 16:40:01 EDT Ventricular Rate:  63 PR Interval:  200 QRS Duration: 108 QT Interval:   422 QTC Calculation: 431 R Axis:   1 Text Interpretation:  Normal sinus rhythm Left ventricular hypertrophy  with repolarization abnormality Abnormal ECG Confirmed by Jeneen Rinks  MD, [place]  [phone]) on 01/30/2015 4:44:16 PM     I discussed the case with cardiology who recommended change in carvedilol and follow-up in the office. MDM   Final diagnoses:  Secondary hypertension, unspecified  Renal insufficiency        Leonard Schwartz, MD 01/30/15 1932

## 2015-01-30 NOTE — ED Notes (Addendum)
He is seen by [facility], has hx of only 30 percent cardiac functioning.  He has been weak and tired.  EKG and troponin ordered.  He is on aspirin only for anticoagulant.   Patient has [employer]

## 2015-01-30 NOTE — Discharge Instructions (Signed)
Increase the carvedilol to 12.5 mg at night and 6.25 mg in the morning.  Have repeat blood tests done in the next week at her doctor's office.   Hypertension Hypertension is another name for high blood pressure. High blood pressure forces your heart to work harder to pump blood. A blood pressure reading has two numbers, which includes a higher number over a lower number (example: 110/72). HOME CARE   Have your blood pressure rechecked by your doctor.  Only take medicine as told by your doctor. Follow the directions carefully. The medicine does not work as well if you skip doses. Skipping doses also puts you at risk for problems.  Do not smoke.  Monitor your blood pressure at home as told by your doctor. GET HELP IF:  You think you are having a reaction to the medicine you are taking.  You have repeat headaches or feel dizzy.  You have puffiness (swelling) in your ankles.  You have trouble with your vision. GET HELP RIGHT AWAY IF:   You get a very bad headache and are confused.  You feel weak, numb, or faint.  You get chest or belly (abdominal) pain.  You throw up (vomit).  You cannot breathe very well. MAKE SURE YOU:   Understand these instructions.  Will watch your condition.  Will get help right away if you are not doing well or get worse. Document Released: 01/03/2008 Document Revised: 07/22/2013 Document Reviewed: 05/09/2013 [facility] Patient Information 2015 [place], [place]. This information is not intended to replace advice given to you by your health care provider. Make sure you discuss any questions you have with your health care provider.

## 2015-01-30 NOTE — ED Notes (Signed)
The pt is c/o high bp all day he takes a bp med once a day. No pain .  He feels tired

## 2015-01-30 NOTE — ED Notes (Signed)
No pain still.  Family at the bedside  Waiting on a disposition.Marland Kitchen  bp lower than at home

## 2015-01-30 NOTE — Telephone Encounter (Signed)
Noah Delaine called regarding Bradley [name]'s BP which was 145/96.  I tried calling back twice with no answer.  Tarri Fuller PAC

## 2015-01-30 NOTE — ED Notes (Signed)
Pt placed in a gown and hooked up to the monitor with 5 lead, BP cuff and pulse ox 

## 2015-01-30 NOTE — ED Notes (Signed)
Patient with reported elevated bp last night and today.  Patient with reported periods of flushing and getting pale.  Patient denies any chest pain, denies dizziness.  No n/v.  Patient is alert.  Patient has been taking meds as directed.  Patient did take an additional lasix today.  Patient has hx of valve replacement and hx of valve infection.  Patient has damage and hx of kidney failure.  Patient also has defibrilator.

## 2015-02-05 ENCOUNTER — Encounter: Payer: Self-pay | Admitting: Nurse Practitioner

## 2015-02-05 NOTE — Progress Notes (Signed)
Electrophysiology Office Note Date: 02/08/2015  ID:  Bradley [name], DOB 09/21/1955, MRN [id number]  PCP: Bradley Post, MD Primary Cardiologist: Harrington Challenger Electrophysiologist: Bradley [name]  CC: ICD [name] hospital follow up  Bradley [name] is a 59 y.o. male seen today for Dr Bradley [name].  He has a complicated past medical history including thyroid cancer (s/p thyroidectomy), hypertension, depression, and aortic stenosis for which he underwent aortic vavulotomy at age 61 with subsequent Bentall procedure in 2001 with a pericardial tissue valve.  He had MSSA bacteremia and bacterial endocarditis in [id number] complicated by renal failure requiring dialysis for a short period of time.  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    He was admitted 11/2014 with progressive episodes of palpitations and pre-syncope. He was found to have monomorphic ventricular tachycardia. Catheterization demonstrated normal coronaries.  Echocardiogram demonstrated EF 30-35% and he underwent STJ dual chamber ICD implant.  Mexiletine was added prior to discharge and his Metoprolol was changed to Coreg.  He had subsequent ventricular arrhythmias and his Mexelitine was increased with no further VT.  He was seen in the ER last week for elevated blood pressure and his Coreg was increased.    He presents today for routine electrophysiology followup.  Since last being seen in our clinic, the patient reports doing very well. He denies chest pain, palpitations, dyspnea, PND, orthopnea, nausea, vomiting, dizziness, syncope, edema, weight gain, or early satiety.  He has not had ICD shocks.   He is walking daily without difficulty and reports compliance with medications.  Device History: STJ dual chamber ICD implanted 2016 for secondary prevention (VT in the setting of non-ischemic cardiomyopathy) History of  appropriate therapy: yes - ATP History of AAD therapy: yes - Mexiletine   Past Medical History  Diagnosis Date  . Anxiety   . Depression   . Hypertension   . Hypothyroidism, postsurgical   . Aortic stenosis     s/p Bentall with bioprosthetic AVR 02/2010; Last echo (9/11): Moderate LVH, EF 45-50%, AVR functioning appropriately, aortic valve mean gradient 21, diastolic dysfunction. Chest MRA (2/13): Mild to moderate dilatation at the sinus of Valsalva at 4.1 cm, mild dilatation ascending aorta distal to the tube graft at 3.9 cm, moderate dilatation of the innominate artery a 2.1 cm;    . Hx of cardiac cath     a. LHC in 02/2010: normal cors  . Staphylococcus aureus bacteremia   . Prosthetic valve endocarditis   . Thyroid cancer   . ESRD on dialysis     a. 09/2013 felt to be related to gentamycin b. no longer requiring HD  . Hx of echocardiogram 02/2014    Echo (8/15):  Mod LV, EF 35-40%, Gr 1 DD, AVR ok (mean 14 mmHg), mild LAE  . Ventricular tachycardia     a. s/p STJ ICD   Past Surgical History  Procedure Laterality Date  . Cardiac catheterization  03/02/2010    NORMAL CORONARY ARTERY  . Sternotomy      REDO  . Transthoracic echocardiogram  03/2010    SHOWED MILD REDUCTION OF LV FUNCTION  . Thyroidectomy  ~ 2005  . Shoulder arthroscopy w/ rotator cuff repair Right 2012  . Av fistula placement Left 10/08/2013    Procedure: ARTERIOVENOUS (AV) FISTULA CREATION- LEFT ARM; Radial Cephalic ;  Surgeon: Mal [name], MD;  Location: [facility];  Service: Vascular;  Laterality: Left;  .  Aortic valve replacement  2011  . Aortic valve repair  1968  . Tee without cardioversion N/A 10/24/2013    Procedure: TRANSESOPHAGEAL ECHOCARDIOGRAM (TEE);  Surgeon: Dorothy Spark, MD;  [facility] ENDOSCOPY;  Service: Cardiovascular;  Laterality: N/A;  Deneen Harts procedure N/A 10/28/2013    Procedure: REDO BENTALL PROCEDURE, debridment of aoritc root abscess, replacement of aortic root, ascending aorta and  aortic valve with homograft. Insertion of left femoral arterial line;  Surgeon: [name], MD;  Location: [facility];  Service: Open Heart Surgery;  Laterality: N/A;  . Intraoperative transesophageal echocardiogram N/A 10/28/2013    Procedure: INTRAOPERATIVE TRANSESOPHAGEAL ECHOCARDIOGRAM;  Surgeon: [name], MD;  Location: [facility];  Service: Open Heart Surgery;  Laterality: N/A;  . Cardiac valve replacement    . Cardiac catheterization N/A 01/01/2015    Procedure: Coronary/Graft Angiography;  Surgeon: [place] Sine, MD;  Location: [facility] [facility];  Service: Cardiovascular;  Laterality: N/A;  . Peripheral vascular catheterization N/A 01/01/2015    Procedure: Aortic Arch Angiography;  Surgeon: [place] Sine, MD;  Location: [place] [facility];  Service: Cardiovascular;  Laterality: N/A;  . Ep implantable device N/A 01/01/2015    Procedure: ICD Implant;  Surgeon: Deboraha Sprang, MD;  Location: [place] [facility];  Service: Cardiovascular;  Laterality: N/A;    Current Outpatient Prescriptions  Medication Sig Dispense Refill  . acetaminophen (TYLENOL) 325 MG tablet Take 1-2 tablets (325-650 mg total) by mouth every 4 (four) hours as needed for mild pain, moderate pain or fever. (Patient taking differently: Take 325 mg by mouth daily as needed (pain). )    . aspirin EC 81 MG tablet Take 81 mg by mouth daily.     Marland Kitchen atorvastatin (LIPITOR) 40 MG tablet Take 1 tablet (40 mg total) by mouth daily at 6 PM. 30 tablet 0  . betamethasone valerate ointment (VALISONE) 0.1 % Apply 1 application topically 2 (two) times daily. (Patient taking differently: Apply 1 application topically 2 (two) times daily as needed (rash, irritation). ) 30 g 0  . carvedilol (COREG) 6.25 MG tablet Take 1 tablet in the morning and 2 tablets at night before bedtime 90 tablet 6  . clonazePAM (KLONOPIN) 1 MG tablet TAKE ONE TABLET BY MOUTH TWICE DAILY AS NEEDED FOR ANXIETY (Patient taking differently: TAKE ONE TABLET BY MOUTH AT  BEDTIME  AS NEEDED FOR ANXIETY/SLEEP) 15 tablet 0  . furosemide (LASIX) 20 MG tablet Take 1 tablet every Monday and Thursday (Patient taking differently: Take 20 mg by mouth daily. Take 1 tablet every Monday and Thursday) 30 tablet 11  . levothyroxine (SYNTHROID, LEVOTHROID) 137 MCG tablet Take 1 tablet (137 mcg total) by mouth daily before breakfast. 90 tablet 0  . lisinopril (PRINIVIL,ZESTRIL) 2.5 MG tablet Take 1 tablet (2.5 mg total) by mouth daily. 90 tablet 3  . mexiletine (MEXITIL) 150 MG capsule TAKE 2 TABLETS BY MOUTH IN THE AM & 2 TABLETS BY MOUTH IN THE PM. (Patient taking differently: Take 300 mg by mouth 2 (two) times daily. ) 120 capsule 1  . Multiple Vitamin (MULTIVITAMIN WITH MINERALS) TABS tablet Take 1 tablet by mouth every other day. Centrum Silver    . Neomycin-Bacitracin-Polymyxin (HCA TRIPLE ANTIBIOTIC OINTMENT EX) 1 application.    . ranolazine (RANEXA) 500 MG 12 hr tablet Take 1 tablet (500 mg total) by mouth 2 (two) times daily. 60 tablet 6  . sertraline (ZOLOFT) 50 MG tablet TAKE ONE TABLET BY MOUTH ONCE DAILY 30  tablet 5   No current facility-administered medications for this visit.    Allergies:   Oxycodone and Rifampin   Social History: History   Social History  . Marital Status: Married    Spouse Name: N/A  . Number of Children: N/A  . Years of Education: N/A   Occupational History  . Not on file.   Social History Main Topics  . Smoking status: Never Smoker   . Smokeless tobacco: Never Used  . Alcohol Use: No     Comment: Occasional  . Drug Use: No  . Sexual Activity: No   Other Topics Concern  . Not on file   Social History Narrative   Lives at home with walker.  Does not drive.  ESRD T, H, Sa.  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    Family History: Family History  Problem Relation Age of Onset  . Heart disease Father   . Thyroid cancer Sister   . Thyroid cancer Sister   . Kidney Stones Sister     Review of Systems: All other systems  reviewed and are otherwise negative except as noted above.   Physical Exam: VS:  BP 120/82 mmHg  Pulse 61  Ht 6\' 1"  (1.854 m)  Wt 198 lb 6.4 oz (89.994 kg)  BMI 26.18 kg/m2 , BMI Body mass index is 26.18 kg/(m^2).  GEN- The patient is well appearing, alert and oriented x 3 today.   HEENT: normocephalic, atraumatic; sclera clear, conjunctiva pink; hearing intact; oropharynx clear; neck supple, no JVP Lymph- no cervical lymphadenopathy Lungs- Clear to ausculation bilaterally, normal work of breathing.  No wheezes, rales, rhonchi Heart- Regular rate and rhythm, no murmurs, rubs or gallops  GI- soft, non-tender, non-distended, bowel sounds present Extremities- no clubbing, cyanosis, or edema; DP/PT/radial pulses 2+ bilaterally MS- no significant deformity or atrophy Skin- warm and dry, no rash or lesion; ICD pocket well healed Psych- euthymic mood, full affect Neuro- strength and sensation are intact  ICD interrogation- reviewed in detail today,  See PACEART report  EKG:  EKG is not ordered today  Recent Labs: 12/29/2014: ALT 24 12/30/2014: B Natriuretic Peptide 550.5*; Magnesium 2.1; TSH 8.262* 01/30/2015: BUN 16; Creatinine, Ser 1.37*; Hemoglobin 16.2; Platelets 143*; Potassium 4.1; Sodium 141   Wt Readings from Last 3 Encounters:  02/08/15 198 lb 6.4 oz (89.994 kg)  01/13/15 194 lb (87.998 kg)  01/02/15 194 lb (87.998 kg)     Other studies Reviewed: Additional studies/ records that were reviewed today include: hospital records  Assessment and Plan:  1.  Ventricular tachycardia/non-ischemic cardiomyopathy Normal ICD function No recurrent VT since mexiletine increased  Continue Mexiletine, Ranexa and Coreg No driving x6 months (pt aware) If recurrent VT despite AAD therapy, would increase Ranexa to 1000mg  bid and if further recurrence, would refer for catheter ablation    2.  Chronic systolic heart failure euvolemic today Stable on an appropriate medical regimen  3.   Hypertension Stable No change required today  4.  Valvular heart disease s/p Bentall Follow up with Dr Harrington Challenger as scheduled   Current medicines are reviewed at length with the patient today.   The patient does not have concerns regarding his medicines.  The following changes were made today:  none  Labs/ tests ordered today include:  Orders Placed This Encounter  Procedures  . Implantable device check    Disposition:   Follow up with Dr Bradley [name] as scheduled in 2 months, follow up with Dr Harrington Challenger as scheduled.  Signed, Chanetta Marshall, NP 02/08/2015 11:12 AM  [place] [address] [phone] (office) [phone] (fax)

## 2015-02-08 ENCOUNTER — Ambulatory Visit (INDEPENDENT_AMBULATORY_CARE_PROVIDER_SITE_OTHER): Payer: Commercial Managed Care - HMO | Admitting: Nurse Practitioner

## 2015-02-08 ENCOUNTER — Encounter: Payer: Self-pay | Admitting: Nurse Practitioner

## 2015-02-08 ENCOUNTER — Other Ambulatory Visit: Payer: Self-pay | Admitting: *Deleted

## 2015-02-08 VITALS — BP 120/82 | HR 61 | Ht 73.0 in | Wt 198.4 lb

## 2015-02-08 DIAGNOSIS — I429 Cardiomyopathy, unspecified: Secondary | ICD-10-CM | POA: Diagnosis not present

## 2015-02-08 DIAGNOSIS — I472 Ventricular tachycardia, unspecified: Secondary | ICD-10-CM

## 2015-02-08 DIAGNOSIS — I10 Essential (primary) hypertension: Secondary | ICD-10-CM

## 2015-02-08 DIAGNOSIS — I428 Other cardiomyopathies: Secondary | ICD-10-CM

## 2015-02-08 LAB — CUP PACEART INCLINIC DEVICE CHECK
Date Time Interrogation Session: 20160711111024
Pulse Gen Serial Number: 7135169

## 2015-02-08 MED ORDER — CARVEDILOL 6.25 MG PO TABS: ORAL_TABLET | ORAL | Status: DC

## 2015-02-08 MED ORDER — RANOLAZINE ER 500 MG PO TB12: 500.0000 mg | ORAL_TABLET | Freq: Two times a day (BID) | ORAL | Status: DC

## 2015-02-08 MED ORDER — FUROSEMIDE 20 MG PO TABS: ORAL_TABLET | ORAL | Status: DC

## 2015-02-08 NOTE — Telephone Encounter (Signed)
Rx sent in for Lasix 20 mg 1 tab on Monday and Thursday's .

## 2015-02-08 NOTE — Patient Instructions (Signed)
Medication Instructions:  Your physician recommends that you continue on your current medications as directed. Please refer to the Current Medication list given to you today.  Labwork: NONE  Testing/Procedures: NONE  Follow-Up: Your physician recommends that you keep scheduled  follow-up appointment with Dr. Caryl Comes and make an appointment with Dr. Harrington Challenger when you receive your letter in October.   Any Other Special Instructions Will Be Listed Below (If Applicable).

## 2015-02-13 ENCOUNTER — Encounter: Payer: Self-pay | Admitting: Internal Medicine

## 2015-02-16 ENCOUNTER — Other Ambulatory Visit: Payer: Self-pay | Admitting: *Deleted

## 2015-02-16 DIAGNOSIS — I10 Essential (primary) hypertension: Secondary | ICD-10-CM

## 2015-02-16 MED ORDER — FUROSEMIDE 20 MG PO TABS: ORAL_TABLET | ORAL | Status: DC

## 2015-02-25 ENCOUNTER — Other Ambulatory Visit: Payer: Self-pay | Admitting: Family Medicine

## 2015-03-01 ENCOUNTER — Other Ambulatory Visit: Payer: Self-pay | Admitting: *Deleted

## 2015-03-01 MED ORDER — ATORVASTATIN CALCIUM 40 MG PO TABS: 40.0000 mg | ORAL_TABLET | Freq: Every day | ORAL | Status: DC

## 2015-03-05 ENCOUNTER — Encounter: Payer: Self-pay | Admitting: Internal Medicine

## 2015-03-09 ENCOUNTER — Other Ambulatory Visit: Payer: Self-pay | Admitting: Physician Assistant

## 2015-03-10 ENCOUNTER — Other Ambulatory Visit (INDEPENDENT_AMBULATORY_CARE_PROVIDER_SITE_OTHER): Payer: Commercial Managed Care - HMO

## 2015-03-10 ENCOUNTER — Other Ambulatory Visit: Payer: Self-pay | Admitting: *Deleted

## 2015-03-10 DIAGNOSIS — E039 Hypothyroidism, unspecified: Secondary | ICD-10-CM

## 2015-03-10 DIAGNOSIS — E875 Hyperkalemia: Secondary | ICD-10-CM | POA: Diagnosis not present

## 2015-03-10 DIAGNOSIS — I10 Essential (primary) hypertension: Secondary | ICD-10-CM

## 2015-03-10 LAB — BASIC METABOLIC PANEL
BUN: 21 mg/dL (ref 6–23)
CO2: 26 mEq/L (ref 19–32)
Calcium: 10 mg/dL (ref 8.4–10.5)
Chloride: 105 mEq/L (ref 96–112)
Creatinine, Ser: 1.37 mg/dL (ref 0.40–1.50)
GFR: 56.7 mL/min — ABNORMAL LOW (ref >60.00)
Glucose, Bld: 101 mg/dL — ABNORMAL HIGH (ref 70–99)
Potassium: 4.6 mEq/L (ref 3.5–5.1)
Sodium: 141 mEq/L (ref 135–145)

## 2015-03-10 LAB — TSH: TSH: 25.13 u[IU]/mL — ABNORMAL HIGH (ref 0.35–4.50)

## 2015-03-10 MED ORDER — FUROSEMIDE 20 MG PO TABS: ORAL_TABLET | ORAL | Status: DC

## 2015-03-25 ENCOUNTER — Telehealth: Payer: Self-pay | Admitting: Physician Assistant

## 2015-03-25 NOTE — Telephone Encounter (Signed)
Pt wife called because his BP was running high today. He started out just a little high, but has trended up today.   He was given an extra lisinopril 2.5 mg but his BP did not improve. Last BP was 152/104.   He has had a GI upset today, gas and belching but not N&V or diarrhea. He has eaten very little. He took 1 Tums, no help.   He has taken all his am meds, but not the pm meds.   He has not had chest pain or SOB. His heart rate has been controlled.   Advised his wife he could take GasX or a med such as Mylanta Gas to help his stomach. Advised her he should have soft, easily digestible food such as mashed potatoes to eat. Avoid fats, raw fruits, etc.  Advised her OK for him to take 1/2 Klonopin now to relax him and the other half at bedtime if that helps. Advised her he could take his pm Coreg early to help his BP come down. Advised her that if his stomach feels better, his BP may improve.   Contact us or his primary MD if further questions or concerns.  Lenoard Aden 03/25/2015 7:46 PM Beeper [id number]'

## 2015-03-26 ENCOUNTER — Encounter: Payer: Self-pay | Admitting: Internal Medicine

## 2015-04-08 ENCOUNTER — Telehealth: Payer: Self-pay | Admitting: Family Medicine

## 2015-04-08 NOTE — Telephone Encounter (Signed)
Wife and patient calling to get lab results from last labs and and possible med change pending lab results.

## 2015-04-08 NOTE — Telephone Encounter (Signed)
Left message for patient to return call.

## 2015-04-12 ENCOUNTER — Encounter: Payer: Self-pay | Admitting: Internal Medicine

## 2015-04-12 ENCOUNTER — Ambulatory Visit (INDEPENDENT_AMBULATORY_CARE_PROVIDER_SITE_OTHER): Payer: Commercial Managed Care - HMO | Admitting: Internal Medicine

## 2015-04-12 VITALS — BP 106/82 | HR 60 | Ht 73.0 in | Wt 203.6 lb

## 2015-04-12 DIAGNOSIS — I472 Ventricular tachycardia, unspecified: Secondary | ICD-10-CM

## 2015-04-12 DIAGNOSIS — I428 Other cardiomyopathies: Secondary | ICD-10-CM

## 2015-04-12 DIAGNOSIS — I429 Cardiomyopathy, unspecified: Secondary | ICD-10-CM | POA: Diagnosis not present

## 2015-04-12 LAB — CUP PACEART INCLINIC DEVICE CHECK
Battery Remaining Longevity: 91.2 mo
Brady Statistic RV Percent Paced: 0.01 %
Date Time Interrogation Session: 20160912165448
HighPow Impedance: 68.625
Lead Channel Impedance Value: 412.5 Ohm
Lead Channel Pacing Threshold Amplitude: 0.5 V
Lead Channel Pacing Threshold Pulse Width: 0.5 ms
Lead Channel Sensing Intrinsic Amplitude: 11.7 mV
Lead Channel Setting Pacing Amplitude: 2.5 V
Lead Channel Setting Pacing Pulse Width: 0.5 ms
Lead Channel Setting Sensing Sensitivity: 0.5 mV
Pulse Gen Serial Number: 7135169
Zone Setting Detection Interval: 250 ms
Zone Setting Detection Interval: 330 ms
Zone Setting Detection Interval: 425 ms

## 2015-04-12 NOTE — Patient Instructions (Signed)
Medication Instructions: - no changes  Labwork: - none  Procedures/Testing: - none  Follow-Up: - Your physician recommends that you schedule a follow-up appointment in: 3 months with Dr. Harrington Challenger  - Remote monitoring is used to monitor your Pacemaker of ICD from home. This monitoring reduces the number of office visits required to check your device to one time per year. It allows [place] to keep an eye on the functioning of your device to ensure it is working properly. You are scheduled for a device check from home on 07/12/15. You may send your transmission at any time that day. If you have a wireless device, the transmission will be sent automatically. After your physician reviews your transmission, you will receive a postcard with your next transmission date.  - Your physician wants you to follow-up in: 6 months with Chanetta Marshall, NP for Dr. Caryl Comes. You will receive a reminder letter in the mail two months in advance. If you don't receive a letter, please call our office to schedule the follow-up appointment.  Any Additional Special Instructions Will Be Listed Below (If Applicable).

## 2015-04-12 NOTE — Progress Notes (Signed)
Patient Care Team: [name], MD as PCP - General [name], MD as Consulting Physician (Cardiothoracic Surgery) Mauricia Area, MD as Consulting Physician (Nephrology)   HPI  Bradley [name] is a 59 y.o. male Seen in follow-up for sustained ventricular tachycardia occurring with a frequency that required adjunctive medical therapy. He ended up on amiodarone.    He was admitted t2/15 with MSSA bacteremia and endocarditis. He had that up with a Bentall procedure debridement of his wound abscess. This was initially done in 2011 and required repeat 3/15.   It had a balloon valvotomy at age 45 for presumed congenital aortic stenosis  .  STJ dual chamber ICD implanted 2016 for secondary prevention (VT in the setting of non-ischemic cardiomyopathy) History of appropriate therapy: yes - ATP History of AAD therapy: yes - Mexiletine   Hospital records reveiwd     Past Medical History  Diagnosis Date  . Anxiety   . Depression   . Hypertension   . Hypothyroidism, postsurgical   . Aortic stenosis     s/p Bentall with bioprosthetic AVR 02/2010; Last echo (9/11): Moderate LVH, EF 45-50%, AVR functioning appropriately, aortic valve mean gradient 21, diastolic dysfunction. Chest MRA (2/13): Mild to moderate dilatation at the sinus of Valsalva at 4.1 cm, mild dilatation ascending aorta distal to the tube graft at 3.9 cm, moderate dilatation of the innominate artery a 2.1 cm;    . Hx of cardiac cath     a. LHC in 02/2010: normal cors  . Staphylococcus aureus bacteremia   . Prosthetic valve endocarditis   . Thyroid cancer   . ESRD on dialysis     a. 09/2013 felt to be related to gentamycin b. no longer requiring HD  . Hx of echocardiogram 02/2014    Echo (8/15):  Mod LV, EF 35-40%, Gr 1 DD, AVR ok (mean 14 mmHg), mild LAE  . Ventricular tachycardia     a. s/p STJ ICD    Past Surgical History  Procedure Laterality Date  . Cardiac catheterization  03/02/2010    NORMAL  CORONARY ARTERY  . Sternotomy      REDO  . Transthoracic echocardiogram  03/2010    SHOWED MILD REDUCTION OF LV FUNCTION  . Thyroidectomy  ~ 2005  . Shoulder arthroscopy w/ rotator cuff repair Right 2012  . Av fistula placement Left 10/08/2013    Procedure: ARTERIOVENOUS (AV) FISTULA CREATION- LEFT ARM; Radial Cephalic ;  Surgeon: Mal [name], MD;  Location: [facility];  Service: Vascular;  Laterality: Left;  . Aortic valve replacement  2011  . Aortic valve repair  1968  . Tee without cardioversion N/A 10/24/2013    Procedure: TRANSESOPHAGEAL ECHOCARDIOGRAM (TEE);  Surgeon: Dorothy Spark, MD;  Location: [facility] ENDOSCOPY;  Service: Cardiovascular;  Laterality: N/A;  Deneen Harts procedure N/A 10/28/2013    Procedure: REDO BENTALL PROCEDURE, debridment of aoritc root abscess, replacement of aortic root, ascending aorta and aortic valve with homograft. Insertion of left femoral arterial line;  Surgeon: [name], MD;  Location: [facility];  Service: Open Heart Surgery;  Laterality: N/A;  . Intraoperative transesophageal echocardiogram N/A 10/28/2013    Procedure: INTRAOPERATIVE TRANSESOPHAGEAL ECHOCARDIOGRAM;  Surgeon: [name], MD;  Location: [facility];  Service: Open Heart Surgery;  Laterality: N/A;  . Cardiac valve replacement    . Cardiac catheterization N/A 01/01/2015    Procedure: Coronary/Graft Angiography;  Surgeon: [place] Sine, MD;  Location: [facility] [place];  Service: Cardiovascular;  Laterality: N/A;  . Peripheral vascular catheterization N/A 01/01/2015    Procedure: Aortic Arch Angiography;  Surgeon: [place] Sine, MD;  Location: [facility];  Service: Cardiovascular;  Laterality: N/A;  . Ep implantable device N/A 01/01/2015    Procedure: ICD Implant;  Surgeon: Deboraha Sprang, MD;  Location: [facility];  Service: Cardiovascular;  Laterality: N/A;    Current Outpatient Prescriptions  Medication Sig Dispense Refill  . acetaminophen (TYLENOL) 325 MG tablet Take 1-2  tablets (325-650 mg total) by mouth every 4 (four) hours as needed for mild pain, moderate pain or fever. (Patient taking differently: Take 325 mg by mouth daily as needed (pain). )    . aspirin EC 81 MG tablet Take 81 mg by mouth daily.     Marland Kitchen atorvastatin (LIPITOR) 40 MG tablet Take 1 tablet (40 mg total) by mouth daily at 6 PM. 90 tablet 0  . betamethasone valerate ointment (VALISONE) 0.1 % Apply 1 application topically 2 (two) times daily. (Patient taking differently: Apply 1 application topically 2 (two) times daily as needed (rash, irritation). ) 30 g 0  . carvedilol (COREG) 6.25 MG tablet Take 1 tablet in the morning and 2 tablets at night before bedtime 90 tablet 6  . clonazePAM (KLONOPIN) 1 MG tablet TAKE ONE TABLET BY MOUTH TWICE DAILY AS NEEDED FOR ANXIETY (Patient taking differently: TAKE ONE TABLET BY MOUTH AT BEDTIME  AS NEEDED FOR ANXIETY/SLEEP) 15 tablet 0  . furosemide (LASIX) 20 MG tablet Take 1 tablet every Monday and Thursday 30 tablet 1  . levothyroxine (SYNTHROID, LEVOTHROID) 137 MCG tablet TAKE ONE TABLET BY MOUTH ONCE DAILY BEFORE  BREAKFAST 90 tablet 1  . lisinopril (PRINIVIL,ZESTRIL) 2.5 MG tablet Take 1 tablet (2.5 mg total) by mouth daily. 90 tablet 3  . mexiletine (MEXITIL) 150 MG capsule TAKE 2 TABLETS BY MOUTH IN THE AM & 2 TABLETS BY MOUTH IN THE PM. (Patient taking differently: Take 300 mg by mouth 2 (two) times daily. ) 120 capsule 1  . Multiple Vitamin (MULTIVITAMIN WITH MINERALS) TABS tablet Take 1 tablet by mouth every other day. Centrum Silver    . Neomycin-Bacitracin-Polymyxin (HCA TRIPLE ANTIBIOTIC OINTMENT EX) 1 application.    . ranolazine (RANEXA) 500 MG 12 hr tablet Take 1 tablet (500 mg total) by mouth 2 (two) times daily. 60 tablet 6  . sertraline (ZOLOFT) 50 MG tablet TAKE ONE TABLET BY MOUTH ONCE DAILY 30 tablet 5   No current facility-administered medications for this visit.    Allergies  Allergen Reactions  . Oxycodone Other (See Comments)     Gives patient nightmares  . Rifampin Nausea Only      Review of Systems negative except from HPI and PMH  Physical Exam BP 106/82 mmHg  Pulse 60  Ht 6\' 1"  (1.854 m)  Wt 203 lb 9.6 oz (92.352 kg)  BMI 26.87 kg/m2  SpO2 98% Well developed and well nourished in no acute distress HENT normal E scleral and icterus clear Neck Supple JVP flat; carotids brisk and full Clear to ausculation  Device pocket well healed; without hematoma or erythema.  There is no tethering  regular rate and rhythm, no murmurs gallops or rub Soft with active bowel sounds No clubbing cyanosis  Edema Alert and oriented, grossly normal motor and sensory function Skin Warm and Dry    Assessment and  Plan  Ventricular tachycarda  ICD St Jude  The patient's device was interrogated.  The information  was reviewed. No changes were made in the programming.     NICM  CHF chronic systolic  Mexilitene   Some recurrent VT  Continue current meds Euvolemic continue current meds

## 2015-04-14 ENCOUNTER — Telehealth: Payer: Self-pay | Admitting: *Deleted

## 2015-04-14 NOTE — Telephone Encounter (Signed)
LMTCB/SSS 

## 2015-04-19 NOTE — Telephone Encounter (Signed)
Spoke to pt & spouse about ATPx1 on 04/16/15. Pt does recall event. He was sitting down for the evening. 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 He recalls pounding & chest tightness during event.   Pt aware no driving [id number] from date of event, not from previous discussions about driving restrictions. Pt aware if device reprogramming or medicinal changes necessary, we will contact him.

## 2015-04-23 ENCOUNTER — Encounter: Payer: Self-pay | Admitting: Internal Medicine

## 2015-04-27 ENCOUNTER — Encounter: Payer: Self-pay | Admitting: Internal Medicine

## 2015-04-30 ENCOUNTER — Other Ambulatory Visit: Payer: Self-pay | Admitting: Internal Medicine

## 2015-05-03 ENCOUNTER — Other Ambulatory Visit: Payer: Self-pay | Admitting: Family Medicine

## 2015-05-03 NOTE — Telephone Encounter (Signed)
Refill for one year 

## 2015-05-03 NOTE — Telephone Encounter (Signed)
Patient requesting Sertraline HCL (Zoloft) 500 mg #30 with no refills.   Last seen: 12/07/2014 Last refill: 11/02/2014 #30 with 5 refills

## 2015-05-09 ENCOUNTER — Other Ambulatory Visit: Payer: Self-pay | Admitting: Family Medicine

## 2015-05-09 ENCOUNTER — Other Ambulatory Visit: Payer: Self-pay | Admitting: Internal Medicine

## 2015-05-10 NOTE — Telephone Encounter (Signed)
Pt last visit 12/07/14 and last refill 01/07/15 #15 no refills. Please advise

## 2015-05-10 NOTE — Telephone Encounter (Signed)
Refill once. Recommend against regular use.

## 2015-05-11 ENCOUNTER — Encounter: Payer: Self-pay | Admitting: Internal Medicine

## 2015-05-18 DIAGNOSIS — I5042 Chronic combined systolic (congestive) and diastolic (congestive) heart failure: Secondary | ICD-10-CM | POA: Diagnosis not present

## 2015-06-04 ENCOUNTER — Other Ambulatory Visit: Payer: Self-pay | Admitting: Family Medicine

## 2015-06-07 ENCOUNTER — Encounter: Payer: Self-pay | Admitting: Internal Medicine

## 2015-06-07 ENCOUNTER — Telehealth: Payer: Self-pay | Admitting: *Deleted

## 2015-06-07 NOTE — Telephone Encounter (Signed)
Called pt regarding ATP x 4 overnight. Pt reports sleeping at the time. No missed medications. Pt feels fine today. Aware of driving restrictions x 6 months. Advised that Dr. Caryl Comes will review episodes and if follow up is warranted we will contact him. Pt verbalizes understanding.

## 2015-06-18 DIAGNOSIS — I5042 Chronic combined systolic (congestive) and diastolic (congestive) heart failure: Secondary | ICD-10-CM | POA: Diagnosis not present

## 2015-06-23 ENCOUNTER — Telehealth: Payer: Self-pay | Admitting: Internal Medicine

## 2015-06-23 DIAGNOSIS — I10 Essential (primary) hypertension: Secondary | ICD-10-CM

## 2015-06-23 DIAGNOSIS — N183 Chronic kidney disease, stage 3 unspecified: Secondary | ICD-10-CM

## 2015-06-23 DIAGNOSIS — Z79899 Other long term (current) drug therapy: Secondary | ICD-10-CM

## 2015-06-23 NOTE — Telephone Encounter (Signed)
New message      Pt c/o BP issue: STAT if pt c/o blurred vision, one-sided weakness or slurred speech  1. What are your last 5 BP readings? 149/100, 131/91,128/92, 140/89, 155/90, 132/85 2. Are you having any other symptoms (ex. Dizziness, headache, blurred vision, passed out)?  Very tired,lying down a lot, congested 3. What is your BP issue? bp is high.  Wife has been giving pt 2 lisinopril in am and 2 lisinopril in pm and sometimes another pill before bedtime for about 3-4 days trying to get bp down,  Please advise

## 2015-06-23 NOTE — Telephone Encounter (Signed)
Reviewed information with Dr Harrington Challenger - pt is to continue Lisinopril 2.5 mg (2) twice a day, return to the correct dose of carvedilol, continue to monitor BP through the weekend and have blood work (BMP/BNP) on Monday.  If BP continues to be elevated during the weekend they should contact the MD on call.  Reviewed information with wife who states understanding.  She will give meds as ordered and bring him in for lab work on Monday.  If BP continues to be elevated during the weekend she will contact the MD on call.  She will keep a diary of BPs to be reviewed and limit is NA intake. Strongly advised not to change medications without speaking with MD first.

## 2015-06-23 NOTE — Telephone Encounter (Signed)
Spoke with wife who is reporting pt's BP has been elevated since  11/20 - BP 155/90 at 10:30 am about 1 hr after taking his morning meds. 11/21 - BP 122/83 at 8:30 am (before meds)                   140/89 at 10:30 pm (1 hr after pm meds) 11/22 - BP 128/92 at 7 am                   131/91 at 9 am                   144/90 at 9:45 am 11/23 - BP 149/100 at 1pm and his HR has been in the 60's.  Today he looks pale and his belly is bloated.  Pt is c/o his head feeling "stuffy" Wt had gone up to 212 lbs but is back down today to 209 lbs.  He has no other complaints other than feeling the way he did before his defib placement.  Wife states he ate a corned beef sandwich from [place] (which a 6" sub has 1770 mg of NA) Wife has been giving pt Lisinopril 2.5 mg two tablets twice a day and sometimes another one before bed. She has only been giving him Carvedilol 6.25 mg twice a day instead of the 1 tablet in the AM and 2 tablets in the PM. Last EF was 30-35% by echo 12/30/14 Has a HX of CHF,HTN and CKD (last BUN/Crea 21/1.37 on 03/10/2015. Advised wife to avoid all prepared foods, all fast foods, all deli meats, hot dogs, bacon, ham etc and that all his food needs to be prepared at home with no added salt to contain no more than 2,000 mg a day of NA. To administer medications as ordered. To continue to monitor BP at least 1 hour after he takes his medications and call if the BP continues to be elevated. Advised I will forward this information to Dr Harrington Challenger for her review and further recommendations/orders.  Wife is aware he is probably going to need lab work to check his kidney function.

## 2015-06-28 ENCOUNTER — Other Ambulatory Visit: Payer: Self-pay

## 2015-06-29 ENCOUNTER — Telehealth: Payer: Self-pay | Admitting: Internal Medicine

## 2015-06-29 NOTE — Telephone Encounter (Signed)
Pt was scheduled for BMP/BNP on 06/28/15. His daughter would like to have labs done at [facility] PCP Dr. Sheldon Silvan. Spoke with Clarice at pt's   PCP office regarding this issue. Pt is scheduled for labs tomorrow 06/30/15 at 10:30 AM. Pt and wife are aware.

## 2015-06-29 NOTE — Telephone Encounter (Signed)
NewMessage  Pt daughter calling to have pt's lab work be drawn at different office-[place]. Please call back and discuss.

## 2015-06-30 ENCOUNTER — Other Ambulatory Visit (INDEPENDENT_AMBULATORY_CARE_PROVIDER_SITE_OTHER): Payer: Commercial Managed Care - HMO

## 2015-06-30 DIAGNOSIS — I10 Essential (primary) hypertension: Secondary | ICD-10-CM | POA: Diagnosis not present

## 2015-06-30 LAB — BASIC METABOLIC PANEL
BUN: 29 mg/dL — ABNORMAL HIGH (ref 6–23)
CO2: 30 mEq/L (ref 19–32)
Calcium: 9.8 mg/dL (ref 8.4–10.5)
Chloride: 105 mEq/L (ref 96–112)
Creatinine, Ser: 1.42 mg/dL (ref 0.40–1.50)
GFR: 54.35 mL/min — ABNORMAL LOW (ref >60.00)
Glucose, Bld: 94 mg/dL (ref 70–99)
Potassium: 5.3 mEq/L — ABNORMAL HIGH (ref 3.5–5.1)
Sodium: 140 mEq/L (ref 135–145)

## 2015-06-30 LAB — BRAIN NATRIURETIC PEPTIDE: Pro B Natriuretic peptide (BNP): 210 pg/mL — ABNORMAL HIGH (ref 0.0–100.0)

## 2015-07-11 ENCOUNTER — Other Ambulatory Visit: Payer: Self-pay | Admitting: Internal Medicine

## 2015-07-12 ENCOUNTER — Ambulatory Visit (INDEPENDENT_AMBULATORY_CARE_PROVIDER_SITE_OTHER): Payer: Commercial Managed Care - HMO | Admitting: Internal Medicine

## 2015-07-12 ENCOUNTER — Encounter: Payer: Self-pay | Admitting: Internal Medicine

## 2015-07-12 ENCOUNTER — Ambulatory Visit (INDEPENDENT_AMBULATORY_CARE_PROVIDER_SITE_OTHER): Payer: Commercial Managed Care - HMO | Admitting: *Deleted

## 2015-07-12 VITALS — BP 118/82 | HR 66 | Ht 73.0 in | Wt 213.4 lb

## 2015-07-12 DIAGNOSIS — I10 Essential (primary) hypertension: Secondary | ICD-10-CM

## 2015-07-12 DIAGNOSIS — I509 Heart failure, unspecified: Secondary | ICD-10-CM | POA: Diagnosis not present

## 2015-07-12 DIAGNOSIS — I5042 Chronic combined systolic (congestive) and diastolic (congestive) heart failure: Secondary | ICD-10-CM

## 2015-07-12 DIAGNOSIS — I429 Cardiomyopathy, unspecified: Secondary | ICD-10-CM

## 2015-07-12 DIAGNOSIS — I428 Other cardiomyopathies: Secondary | ICD-10-CM

## 2015-07-12 LAB — BASIC METABOLIC PANEL
BUN: 28 mg/dL — ABNORMAL HIGH (ref 7–25)
CO2: 27 mmol/L (ref 20–31)
Calcium: 9.7 mg/dL (ref 8.6–10.3)
Chloride: 106 mmol/L (ref 98–110)
Creat: 1.21 mg/dL (ref 0.70–1.33)
Glucose, Bld: 69 mg/dL (ref 65–99)
Potassium: 5.2 mmol/L (ref 3.5–5.3)
Sodium: 139 mmol/L (ref 135–146)

## 2015-07-12 MED ORDER — MEXILETINE HCL 150 MG PO CAPS: ORAL_CAPSULE | ORAL | Status: DC

## 2015-07-12 MED ORDER — FUROSEMIDE 20 MG PO TABS: ORAL_TABLET | ORAL | Status: DC

## 2015-07-12 NOTE — Progress Notes (Signed)
Remote ICD transmission.   

## 2015-07-12 NOTE — Progress Notes (Signed)
Cardiology Office Note   Date:  07/12/2015   ID:  Bradley [name], DOB [birth year], MRN [id number]  PCP:  [name], MD  Cardiologist:   Dorris Carnes, MD   No chief complaint on file.     History of Present Illness: Bradley [name] is a 60 y.o. male with a history of aortic stenosis requiring aortic valvulotomy at age 20 and subsequent Bentall procedure in 02/2000 with placement of a pericardial tissue valve. Cardiac catheterization 02/2010 demonstrated normal coronary arteries. Other history includes LBBB, thyroid cancer, status post thyroidectomy in 2005, surgical hypothyroidism, HTN, depression, chronically elevated troponins.  He was admitted in 08/2013 with MSSA bacteremia and bacterial endocarditis with evidence of cerebral emboli. He had worsening renal function (possibly from gentamicin) and was started on hemodialysis. 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 He was found to have a perivalvular abscess on TEE and cardiac CTA and ultimately underwent redo Bentall procedure with homograft and debridement of root abscess. He required amiodarone in the OR for VT/VF (required multiple shocks). He continued to require hemodialysis. He had a prolonged hospitalization and was d/c 11/13/13.  Admitted [id number] with a/c systolic CHF. Echo demonstrated worsening LVF with EF 20-25%. ACEI and beta blocker were started. Cardiac CTA prior to his redo Bentall demonstrated normal cors. Of note, TSH was over 50 and his synthroid was adjusted.  I saw the pt in January 2016  He has been seen by A Lynnell Jude and Olin Pia since     Intermittent SOB  Not daily  Dizzy occasionally  No PND      Current Outpatient Prescriptions  Medication Sig Dispense Refill  . acetaminophen (TYLENOL) 325 MG tablet Take 1-2 tablets (325-650 mg total) by mouth every 4 (four) hours as needed for mild pain, moderate pain or fever. (Patient taking differently: Take 325 mg by mouth  daily as needed (pain). )    . aspirin EC 81 MG tablet Take 81 mg by mouth daily.     Marland Kitchen atorvastatin (LIPITOR) 40 MG tablet TAKE ONE TABLET BY MOUTH ONCE DAILY AT  6PM 90 tablet 0  . betamethasone valerate ointment (VALISONE) 0.1 % Apply 1 application topically 2 (two) times daily. (Patient taking differently: Apply 1 application topically 2 (two) times daily as needed (rash, irritation). ) 30 g 0  . carvedilol (COREG) 6.25 MG tablet Take 1 tablet in the morning and 2 tablets at night before bedtime 90 tablet 6  . clonazePAM (KLONOPIN) 1 MG tablet TAKE ONE TABLET BY MOUTH TWICE DAILY AS NEEDED. NOT FOR REGULAR USE. 30 tablet 0  . docusate sodium (STOOL SOFTENER) 100 MG capsule Take 100 mg by mouth 2 (two) times daily.    . furosemide (LASIX) 20 MG tablet Take 1 tablet every Monday and Thursday 30 tablet 1  . levothyroxine (SYNTHROID, LEVOTHROID) 137 MCG tablet TAKE ONE TABLET BY MOUTH ONCE DAILY BEFORE  BREAKFAST 90 tablet 1  . lisinopril (PRINIVIL,ZESTRIL) 2.5 MG tablet Take 1 tablet (2.5 mg total) by mouth daily. 90 tablet 3  . mexiletine (MEXITIL) 150 MG capsule TAKE 2 CAPSULES BY MOUTH EVERY MORNING AND TAKE 2 CAPSULES EVERY EVENING 120 capsule 01  . Multiple Vitamin (MULTIVITAMIN WITH MINERALS) TABS tablet Take 1 tablet by mouth every other day. Centrum Silver    . Neomycin-Bacitracin-Polymyxin (HCA TRIPLE ANTIBIOTIC OINTMENT EX) 1 application.    . ranolazine (RANEXA) 500 MG 12 hr tablet Take 1 tablet (500 mg total) by mouth 2 (  two) times daily. 60 tablet 6  . sertraline (ZOLOFT) 50 MG tablet TAKE ONE TABLET BY MOUTH ONCE DAILY 30 tablet 11   No current facility-administered medications for this visit.    Allergies:   Oxycodone and Rifampin   Past Medical History  Diagnosis Date  . Anxiety   . Depression   . Hypertension   . Hypothyroidism, postsurgical   . Aortic stenosis     s/p Bentall with bioprosthetic AVR 02/2010; Last echo (9/11): Moderate LVH, EF 45-50%, AVR functioning  appropriately, aortic valve mean gradient 21, diastolic dysfunction. Chest MRA (2/13): Mild to moderate dilatation at the sinus of Valsalva at 4.1 cm, mild dilatation ascending aorta distal to the tube graft at 3.9 cm, moderate dilatation of the innominate artery a 2.1 cm;    . Hx of cardiac cath     a. LHC in 02/2010: normal cors  . Staphylococcus aureus bacteremia   . Prosthetic valve endocarditis ([place])   . Thyroid cancer ([place])   . ESRD on dialysis [facility])     a. 09/2013 felt to be related to gentamycin b. no longer requiring HD  . Hx of echocardiogram 02/2014    Echo (8/15):  Mod LV, EF 35-40%, Gr 1 DD, AVR ok (mean 14 mmHg), mild LAE  . Ventricular tachycardia ([place])     a. s/p STJ ICD    Past Surgical History  Procedure Laterality Date  . Cardiac catheterization  03/02/2010    NORMAL CORONARY ARTERY  . Sternotomy      REDO  . Transthoracic echocardiogram  03/2010    SHOWED MILD REDUCTION OF LV FUNCTION  . Thyroidectomy  ~ 2005  . Shoulder arthroscopy w/ rotator cuff repair Right 2012  . Av fistula placement Left 10/08/2013    Procedure: ARTERIOVENOUS (AV) FISTULA CREATION- LEFT ARM; Radial Cephalic ;  Surgeon: Mal [name], MD;  Location: [facility];  Service: Vascular;  Laterality: Left;  . Aortic valve replacement  2011  . Aortic valve repair  1968  . Tee without cardioversion N/A 10/24/2013    Procedure: TRANSESOPHAGEAL ECHOCARDIOGRAM (TEE);  Surgeon: Dorothy Spark, MD;  Location: [facility] ENDOSCOPY;  Service: Cardiovascular;  Laterality: N/A;  Deneen [facility] procedure N/A 10/28/2013    Procedure: REDO BENTALL PROCEDURE, debridment of aoritc root abscess, replacement of aortic root, ascending aorta and aortic valve with homograft. Insertion of left femoral arterial line;  Surgeon: [name], MD;  Location: [place];  Service: Open Heart Surgery;  Laterality: N/A;  . Intraoperative transesophageal echocardiogram N/A 10/28/2013    Procedure: INTRAOPERATIVE TRANSESOPHAGEAL ECHOCARDIOGRAM;   Surgeon: [name], MD;  Location: [place];  Service: Open Heart Surgery;  Laterality: N/A;  . Cardiac valve replacement    . Cardiac catheterization N/A 01/01/2015    Procedure: Coronary/Graft Angiography;  Surgeon: Troy Sine, MD;  Location: [place] CV LAB;  Service: Cardiovascular;  Laterality: N/A;  . Peripheral vascular catheterization N/A 01/01/2015    Procedure: Aortic Arch Angiography;  Surgeon: Troy Sine, MD;  Location: [place] CV LAB;  Service: Cardiovascular;  Laterality: N/A;  . Ep implantable device N/A 01/01/2015    Procedure: ICD Implant;  Surgeon: Deboraha Sprang, MD;  Location: [place] CV LAB;  Service: Cardiovascular;  Laterality: N/A;     Social History:  The patient  reports that he has never smoked. He has never used smokeless tobacco. He reports that he does not drink alcohol or use illicit drugs.   Family History:  The patient's family history includes Heart disease in his father; Kidney Stones in his sister; Thyroid cancer in his sister and sister.    ROS:  Please see the history of present illness. All other systems are reviewed and  Negative to the above problem except as noted.    PHYSICAL EXAM: VS:  BP 118/82 mmHg  Pulse 66  Ht 6\' 1"  (1.854 m)  Wt 96.798 kg (213 lb 6.4 oz)  BMI 28.16 kg/m2  SpO2 98%  GEN: Well nourished, well developed, in no acute distress HEENT: normal Neck: no JVD, carotid bruits, or masses Cardiac: RRR; no murmurs, rubs, or gallops,no edema  Respiratory:  clear to auscultation bilaterally, normal work of breathing GI: soft, nontender, nondistended, + BS  No hepatomegaly  MS: no deformity Moving all extremities   Skin: warm and dry, no rash Neuro:  Strength and sensation are intact Psych: euthymic mood, full affect   EKG:  EKG is not ordered today.   Lipid Panel    Component Value Date/Time   CHOL 168 12/30/2014 0452   TRIG 89 12/30/2014 0452   HDL 43 12/30/2014 0452   CHOLHDL 3.9 12/30/2014 0452    VLDL 18 12/30/2014 0452   LDLCALC 107* 12/30/2014 0452      Wt Readings from Last 3 Encounters:  07/12/15 96.798 kg (213 lb 6.4 oz)  04/12/15 92.352 kg (203 lb 9.6 oz)  02/08/15 89.994 kg (198 lb 6.4 oz)      ASSESSMENT AND PLAN:   1  Chronic systolic CHF   Volume triv up  Will check BMET  Refill lasix to take couple times per wk  2  VT/VF  Has ICD  Will discuss with EP Renexa  Keep on for now.     Signed, Dorris Carnes, MD  07/12/2015 11:41 AM    [address], [place], [place]  [address] Phone: [phone]; Fax: [phone]

## 2015-07-12 NOTE — Patient Instructions (Signed)
Your physician recommends that you continue on your current medications as directed. Please refer to the Current Medication list given to you today. Your physician recommends that you return for lab work today (BMET)  Your physician wants you to follow-up in: Aug, 2017 with Dr. Harrington Challenger.  You will receive a reminder letter in the mail two months in advance. If you don't receive a letter, please call our office to schedule the follow-up appointment.

## 2015-07-13 ENCOUNTER — Encounter: Payer: Self-pay | Admitting: Cardiology

## 2015-07-13 LAB — CUP PACEART REMOTE DEVICE CHECK
Battery Remaining Longevity: 86 mo
Battery Remaining Percentage: 84 %
Battery Voltage: 3.01 V
Brady Statistic RV Percent Paced: 1 %
Date Time Interrogation Session: 20161212070017
HighPow Impedance: 77 Ohm
HighPow Impedance: 77 Ohm
Implantable Lead Implant Date: 20160603
Implantable Lead Location: 753860
Implantable Lead Model: 7122
Lead Channel Impedance Value: 440 Ohm
Lead Channel Sensing Intrinsic Amplitude: 11.7 mV
Lead Channel Setting Pacing Amplitude: 2.5 V
Lead Channel Setting Pacing Pulse Width: 0.5 ms
Lead Channel Setting Sensing Sensitivity: 0.5 mV
Pulse Gen Serial Number: 7135169

## 2015-07-19 DIAGNOSIS — I5042 Chronic combined systolic (congestive) and diastolic (congestive) heart failure: Secondary | ICD-10-CM | POA: Diagnosis not present

## 2015-07-27 ENCOUNTER — Encounter: Payer: Self-pay | Admitting: Cardiology

## 2015-08-01 ENCOUNTER — Other Ambulatory Visit: Payer: Self-pay | Admitting: Family Medicine

## 2015-08-17 ENCOUNTER — Other Ambulatory Visit: Payer: Self-pay | Admitting: Family Medicine

## 2015-08-19 DIAGNOSIS — I5042 Chronic combined systolic (congestive) and diastolic (congestive) heart failure: Secondary | ICD-10-CM | POA: Diagnosis not present

## 2015-08-25 ENCOUNTER — Encounter: Payer: Self-pay | Admitting: Nurse Practitioner

## 2015-08-28 ENCOUNTER — Other Ambulatory Visit: Payer: Self-pay | Admitting: Family Medicine

## 2015-09-18 ENCOUNTER — Other Ambulatory Visit: Payer: Self-pay | Admitting: Family Medicine

## 2015-09-18 DIAGNOSIS — I5042 Chronic combined systolic (congestive) and diastolic (congestive) heart failure: Secondary | ICD-10-CM | POA: Diagnosis not present

## 2015-09-20 ENCOUNTER — Other Ambulatory Visit: Payer: Self-pay | Admitting: Internal Medicine

## 2015-09-20 NOTE — Telephone Encounter (Signed)
Refill once.  Avoid regular use. 

## 2015-09-25 ENCOUNTER — Encounter: Payer: Self-pay | Admitting: Internal Medicine

## 2015-09-27 ENCOUNTER — Telehealth: Payer: Self-pay | Admitting: *Deleted

## 2015-09-27 NOTE — Telephone Encounter (Signed)
LMTCB/sss  VT-1 episode w/ ATP recorded on 09/24/15.

## 2015-10-03 ENCOUNTER — Other Ambulatory Visit: Payer: Self-pay | Admitting: Nurse Practitioner

## 2015-10-08 NOTE — Telephone Encounter (Signed)
LMTCB on home #.  N/A on cell phone-unable to LM/sss

## 2015-10-11 ENCOUNTER — Encounter: Payer: Self-pay | Admitting: Nurse Practitioner

## 2015-10-13 NOTE — Progress Notes (Signed)
Electrophysiology Office Note Date: 10/14/2015  ID:  Bradley [name], DOB 23-Jan-1956, MRN [id number]  PCP: [name], MD Primary Cardiologist: Harrington Challenger Electrophysiologist: Caryl Comes  CC: ICD follow up  Bradley [name] is a 60 y.o. male seen today for Dr Caryl Comes.  He has a complicated past medical history including thyroid cancer (s/p thyroidectomy), hypertension, depression, and aortic stenosis for which he underwent aortic vavulotomy at age 57 with subsequent Bentall procedure in 2001 with a pericardial tissue valve.  He had MSSA bacteremia and bacterial endocarditis in [id number] complicated by renal failure requiring dialysis for a short period of time.  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    He was admitted 11/2014 with progressive episodes of palpitations and pre-syncope. He was found to have monomorphic ventricular tachycardia. Catheterization demonstrated normal coronaries.  Echocardiogram demonstrated EF 30-35% and he underwent STJ dual chamber ICD implant.  Mexiletine was added prior to discharge and his Metoprolol was changed to Coreg.  He had subsequent ventricular arrhythmias and his Mexelitine was increased with no further VT.  He presents today for routine electrophysiology followup.  Since last being seen in our clinic, the patient reports doing relatively well.  He is depressed about the limitations in his activity 2/2 heart failure.  He has shortness of breath with moderate activity and is unable to climb stairs. He denies chest pain, palpitations,orthopnea, nausea, vomiting, dizziness, syncope, edema, weight gain, or early satiety.  He has not had ICD shocks.   Device History: STJ dual chamber ICD implanted 2016 for secondary prevention (VT in the setting of non-ischemic cardiomyopathy) History of appropriate therapy: yes - ATP History of  AAD therapy: yes - Mexiletine   Past Medical History  Diagnosis Date  . Anxiety   . Depression   . Hypertension   . Hypothyroidism, postsurgical   . Aortic stenosis     s/p Bentall with bioprosthetic AVR 02/2010; Last echo (9/11): Moderate LVH, EF 45-50%, AVR functioning appropriately, aortic valve mean gradient 21, diastolic dysfunction. Chest MRA (2/13): Mild to moderate dilatation at the sinus of Valsalva at 4.1 cm, mild dilatation ascending aorta distal to the tube graft at 3.9 cm, moderate dilatation of the innominate artery a 2.1 cm;    . Hx of cardiac cath     a. LHC in 02/2010: normal cors  . Staphylococcus aureus bacteremia   . Prosthetic valve endocarditis ([place])   . Thyroid cancer ([place])   . ESRD on dialysis [facility])     a. 09/2013 felt to be related to gentamycin b. no longer requiring HD  . Hx of echocardiogram 02/2014    Echo (8/15):  Mod LV, EF 35-40%, Gr 1 DD, AVR ok (mean 14 mmHg), mild LAE  . Ventricular tachycardia ([place])     a. s/p STJ ICD   Past Surgical History  Procedure Laterality Date  . Cardiac catheterization  03/02/2010    NORMAL CORONARY ARTERY  . Sternotomy      REDO  . Transthoracic echocardiogram  03/2010    SHOWED MILD REDUCTION OF LV FUNCTION  . Thyroidectomy  ~ 2005  . Shoulder arthroscopy w/ rotator cuff repair Right 2012  . Av fistula placement Left 10/08/2013    Procedure: ARTERIOVENOUS (AV) FISTULA CREATION- LEFT ARM; Radial Cephalic ;  Surgeon: Mal [name], MD;  Location: [place];  Service: Vascular;  Laterality: Left;  . Aortic valve replacement  2011  . Aortic valve repair  1968  . Tee without cardioversion N/A 10/24/2013    Procedure: TRANSESOPHAGEAL ECHOCARDIOGRAM (TEE);  Surgeon: Dorothy Spark, MD;  Location[facility] ENDOSCOPY;  Service: Cardiovascular;  Laterality: N/A;  Deneen [facility] procedure N/A 10/28/2013    Procedure: REDO BENTALL PROCEDURE, debridment of aoritc root abscess, replacement of aortic root, ascending aorta and aortic valve with  homograft. Insertion of left femoral arterial line;  Surgeon: [name], MD;  Location: [place];  Service: Open Heart Surgery;  Laterality: N/A;  . Intraoperative transesophageal echocardiogram N/A 10/28/2013    Procedure: INTRAOPERATIVE TRANSESOPHAGEAL ECHOCARDIOGRAM;  Surgeon: [name], MD;  Location: [place];  Service: Open Heart Surgery;  Laterality: N/A;  . Cardiac valve replacement    . Cardiac catheterization N/A 01/01/2015    Procedure: Coronary/Graft Angiography;  Surgeon: Troy Sine, MD;  Location: [place] CV LAB;  Service: Cardiovascular;  Laterality: N/A;  . Peripheral vascular catheterization N/A 01/01/2015    Procedure: Aortic Arch Angiography;  Surgeon: Troy Sine, MD;  Location: [place] CV LAB;  Service: Cardiovascular;  Laterality: N/A;  . Ep implantable device N/A 01/01/2015    Procedure: ICD Implant;  Surgeon: Deboraha Sprang, MD;  Location: [place] CV LAB;  Service: Cardiovascular;  Laterality: N/A;    Current Outpatient Prescriptions  Medication Sig Dispense Refill  . aspirin EC 81 MG tablet Take 81 mg by mouth daily.     Marland Kitchen atorvastatin (LIPITOR) 40 MG tablet TAKE ONE TABLET BY MOUTH ONCE DAILY AT  6PM 90 tablet 0  . betamethasone valerate ointment (VALISONE) 0.1 % APPLY  OINTMENT TO AFFECTED AREA TWICE DAILY 30 g 2  . carvedilol (COREG) 6.25 MG tablet TAKE 1 TABLET IN THE MORNING AND 2 TABLETS AT NIGHT BEFORE BEDTIME 90 tablet 5  . clonazePAM (KLONOPIN) 1 MG tablet TAKE ONE TABLET BY MOUTH TWICE DAILY AS NEEDED(NOT FOR REGULAR USE). 30 tablet 0  . docusate sodium (STOOL SOFTENER) 100 MG capsule Take 100 mg by mouth 2 (two) times daily.    . furosemide (LASIX) 20 MG tablet Take 1 tablet every Monday and Thursday 30 tablet 11  . levothyroxine (SYNTHROID, LEVOTHROID) 137 MCG tablet TAKE ONE TABLET BY MOUTH ONCE DAILY BEFORE BREAKFAST 90 tablet 0  . lisinopril (PRINIVIL,ZESTRIL) 2.5 MG tablet Take 1 tablet (2.5 mg total) by mouth daily. 90 tablet 3  .  mexiletine (MEXITIL) 150 MG capsule TAKE 2 CAPSULES BY MOUTH EVERY MORNING AND TAKE 2 CAPSULES EVERY EVENING 360 capsule 3  . Multiple Vitamin (MULTIVITAMIN WITH MINERALS) TABS tablet Take 1 tablet by mouth every other day. Centrum Silver    . Neomycin-Bacitracin-Polymyxin (HCA TRIPLE ANTIBIOTIC OINTMENT EX) 1 application.    Marland Kitchen RANEXA 500 MG 12 hr tablet TAKE 1 TABLET BY MOUTH TWICE A DAY 60 tablet 5  . sertraline (ZOLOFT) 50 MG tablet TAKE ONE TABLET BY MOUTH ONCE DAILY 30 tablet 11   No current facility-administered medications for this visit.    Allergies:   Oxycodone and Rifampin   Social History: Social History   Social History  . Marital Status: Married    Spouse Name: N/A  . Number of Children: N/A  . Years of Education: N/A   Occupational History  . Not on file.   Social History Main Topics  . Smoking status: Never Smoker   . Smokeless tobacco: Never Used  . Alcohol Use: No     Comment: Occasional  . Drug Use: No  .  Sexual Activity: No   Other Topics Concern  . Not on file   Social History Narrative   Lives at home with walker.  Does not drive.  ESRD T, H, Sa.  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    Family History: Family History  Problem Relation Age of Onset  . Heart disease Father   . Thyroid cancer Sister   . Thyroid cancer Sister   . Kidney Stones Sister   . Heart attack Neg Hx   . Stroke Father   . Hypertension Mother   . Hypertension Father     Review of Systems: All other systems reviewed and are otherwise negative except as noted above.   Physical Exam: VS:  BP 120/90 mmHg  Pulse 65  Ht 6\' 1"  (1.854 m)  Wt 211 lb 12.8 oz (96.072 kg)  BMI 27.95 kg/m2 , BMI Body mass index is 27.95 kg/(m^2).  GEN- The patient is elderly appearing, alert and oriented x 3 today.   HEENT: normocephalic, atraumatic; sclera clear, conjunctiva pink; hearing intact; oropharynx clear; neck supple Lungs- Clear to ausculation bilaterally, normal work of breathing.  No  wheezes, rales, rhonchi Heart- Regular rate and rhythm  GI- soft, non-tender, non-distended, bowel sounds present Extremities- no clubbing, cyanosis, or edema; DP/PT/radial pulses 2+ bilaterally MS- no significant deformity or atrophy Skin- warm and dry, no rash or lesion; ICD pocket well healed Psych- euthymic mood, full affect Neuro- strength and sensation are intact  ICD interrogation- reviewed in detail today,  See PACEART report  EKG:  EKG is not ordered today  Recent Labs: 12/29/2014: ALT 24 12/30/2014: B Natriuretic Peptide 550.5*; Magnesium 2.1 01/30/2015: Hemoglobin 16.2; Platelets 143* 03/10/2015: TSH 25.13* 06/30/2015: Pro B Natriuretic peptide (BNP) 210.0* 07/12/2015: BUN 28*; Creat 1.21; Potassium 5.2; Sodium 139   Wt Readings from Last 3 Encounters:  10/14/15 211 lb 12.8 oz (96.072 kg)  07/12/15 213 lb 6.4 oz (96.798 kg)  04/12/15 203 lb 9.6 oz (92.352 kg)     Other studies Reviewed: Additional studies/ records that were reviewed today include: Dr Caryl Comes and Dr Harrington Challenger' office notes  Assessment and Plan:  1.  Ventricular tachycardia/non-ischemic cardiomyopathy Normal ICD function 2 episodes of VT pace terminated in Feb 2017, several other NSVT episodes. Continue Mexiletine, Ranexa and Coreg  Will discuss with Dr Caryl Comes need for medication adjustments   2.  Chronic systolic heart failure Class III heart failure symptoms Will check BMET today and increase Lasix to daily if stable CPX ordered as well to evaluate functional status   3.  Hypertension Stable No change required today  4.  Valvular heart disease s/p Bentall Follow up with Dr Harrington Challenger as scheduled   Current medicines are reviewed at length with the patient today.   The patient does not have concerns regarding his medicines.  The following changes were made today:  none  Labs/ tests ordered today include:  Orders Placed This Encounter  Procedures  . Cardiopulmonary exercise test  . Basic Metabolic Panel  (BMET)    Disposition:   Follow up with [place], Dr Caryl Comes 6 months, follow up with Dr Harrington Challenger as scheduled.    Signed, Chanetta Marshall, NP 10/14/2015 12:53 PM  [address] [place] [address] [phone] (office) [phone] (fax)

## 2015-10-14 ENCOUNTER — Encounter: Payer: Self-pay | Admitting: Internal Medicine

## 2015-10-14 ENCOUNTER — Encounter: Payer: Self-pay | Admitting: Nurse Practitioner

## 2015-10-14 ENCOUNTER — Ambulatory Visit (INDEPENDENT_AMBULATORY_CARE_PROVIDER_SITE_OTHER): Payer: Commercial Managed Care - HMO | Admitting: Nurse Practitioner

## 2015-10-14 VITALS — BP 120/90 | HR 65 | Ht 73.0 in | Wt 211.8 lb

## 2015-10-14 DIAGNOSIS — I10 Essential (primary) hypertension: Secondary | ICD-10-CM

## 2015-10-14 DIAGNOSIS — I472 Ventricular tachycardia, unspecified: Secondary | ICD-10-CM

## 2015-10-14 DIAGNOSIS — I5022 Chronic systolic (congestive) heart failure: Secondary | ICD-10-CM

## 2015-10-14 LAB — BASIC METABOLIC PANEL
BUN: 22 mg/dL (ref 7–25)
CO2: 27 mmol/L (ref 20–31)
Calcium: 9.3 mg/dL (ref 8.6–10.3)
Chloride: 105 mmol/L (ref 98–110)
Creat: 1.28 mg/dL (ref 0.70–1.33)
Glucose, Bld: 87 mg/dL (ref 65–99)
Potassium: 4.5 mmol/L (ref 3.5–5.3)
Sodium: 141 mmol/L (ref 135–146)

## 2015-10-14 NOTE — Patient Instructions (Addendum)
Medication Instructions:   Your physician recommends that you continue on your current medications as directed. Please refer to the Current Medication list given to you today.   If you need a refill on your cardiac medications before your next appointment, please call your pharmacy.  Labwork: BMET    Testing/Procedures: Your physician has recommended that you have a cardiopulmonary stress test (CPX). CPX testing is a non-invasive measurement of heart and lung function. It replaces a traditional treadmill stress test. This type of test provides a tremendous amount of information that relates not only to your present condition but also for future outcomes. This test combines measurements of you ventilation, respiratory gas exchange in the lungs, electrocardiogram (EKG), blood pressure and physical response before, during, and following an exercise protocol.   Follow-Up:  Your physician wants you to follow-up in:  [place]  [place].Marland Kitchen You will receive a reminder letter in the mail two months in advance. If you don't receive a letter, please call our office to schedule the follow-up appointment.  Remote monitoring is used to monitor your Pacemaker of ICD from home. This monitoring reduces the number of office visits required to check your device to one time per year. It allows [place] to keep an eye on the functioning of your device to ensure it is working properly. You are scheduled for a device check from home on..01/17/16..You may send your transmission at any time that day. If you have a wireless device, the transmission will be sent automatically. After your physician reviews your transmission, you will receive a postcard with your next transmission date.   Any Other Special Instructions Will Be Listed Below (If Applicable).

## 2015-10-15 ENCOUNTER — Other Ambulatory Visit: Payer: Self-pay | Admitting: *Deleted

## 2015-10-15 ENCOUNTER — Telehealth: Payer: Self-pay | Admitting: *Deleted

## 2015-10-15 DIAGNOSIS — I10 Essential (primary) hypertension: Secondary | ICD-10-CM

## 2015-10-15 NOTE — Telephone Encounter (Signed)
-----   Message from Patsey Berthold, NP sent at 10/15/2015  4:24 PM EDT ----- Please notify patient of stable labs.  Try scheduled Lasix 4 days/week to see if improves shortness of breath.

## 2015-10-16 DIAGNOSIS — I5042 Chronic combined systolic (congestive) and diastolic (congestive) heart failure: Secondary | ICD-10-CM | POA: Diagnosis not present

## 2015-10-25 ENCOUNTER — Other Ambulatory Visit (HOSPITAL_COMMUNITY): Payer: Self-pay | Admitting: *Deleted

## 2015-10-25 ENCOUNTER — Ambulatory Visit (HOSPITAL_COMMUNITY): Payer: Commercial Managed Care - HMO | Attending: Nurse Practitioner

## 2015-10-25 DIAGNOSIS — I472 Ventricular tachycardia, unspecified: Secondary | ICD-10-CM

## 2015-10-25 DIAGNOSIS — I10 Essential (primary) hypertension: Secondary | ICD-10-CM

## 2015-10-25 DIAGNOSIS — I110 Hypertensive heart disease with heart failure: Secondary | ICD-10-CM | POA: Insufficient documentation

## 2015-10-25 DIAGNOSIS — I5022 Chronic systolic (congestive) heart failure: Secondary | ICD-10-CM | POA: Diagnosis not present

## 2015-10-31 ENCOUNTER — Other Ambulatory Visit: Payer: Self-pay | Admitting: Internal Medicine

## 2015-11-01 ENCOUNTER — Telehealth: Payer: Self-pay | Admitting: *Deleted

## 2015-11-01 NOTE — Telephone Encounter (Signed)
-----   Message from Patsey Berthold, NP sent at 11/01/2015  6:11 AM EDT ----- Please notify patient that CPX shows limitation mostly related to HF.  Please refer to Heart Failure clinic for evaluation.

## 2015-11-16 DIAGNOSIS — I5042 Chronic combined systolic (congestive) and diastolic (congestive) heart failure: Secondary | ICD-10-CM | POA: Diagnosis not present

## 2015-11-28 ENCOUNTER — Other Ambulatory Visit: Payer: Self-pay | Admitting: Family Medicine

## 2015-11-29 ENCOUNTER — Ambulatory Visit (HOSPITAL_COMMUNITY)
Admission: RE | Admit: 2015-11-29 | Discharge: 2015-11-29 | Disposition: A | Discharge status: Home / Self Care | Payer: Commercial Managed Care - HMO | Source: Ambulatory Visit | Attending: Internal Medicine | Admitting: Internal Medicine

## 2015-11-29 VITALS — BP 134/86 | HR 63 | Wt 206.0 lb

## 2015-11-29 DIAGNOSIS — I110 Hypertensive heart disease with heart failure: Secondary | ICD-10-CM | POA: Insufficient documentation

## 2015-11-29 DIAGNOSIS — I5042 Chronic combined systolic (congestive) and diastolic (congestive) heart failure: Secondary | ICD-10-CM | POA: Insufficient documentation

## 2015-11-29 DIAGNOSIS — Z95810 Presence of automatic (implantable) cardiac defibrillator: Secondary | ICD-10-CM | POA: Insufficient documentation

## 2015-11-29 DIAGNOSIS — F333 Major depressive disorder, recurrent, severe with psychotic symptoms: Secondary | ICD-10-CM | POA: Insufficient documentation

## 2015-11-29 DIAGNOSIS — I10 Essential (primary) hypertension: Secondary | ICD-10-CM

## 2015-11-29 DIAGNOSIS — Z9889 Other specified postprocedural states: Secondary | ICD-10-CM | POA: Diagnosis not present

## 2015-11-29 DIAGNOSIS — Z8679 Personal history of other diseases of the circulatory system: Secondary | ICD-10-CM

## 2015-11-29 DIAGNOSIS — F411 Generalized anxiety disorder: Secondary | ICD-10-CM

## 2015-11-29 MED ORDER — SACUBITRIL-VALSARTAN 24-26 MG PO TABS: 1.0000 | ORAL_TABLET | Freq: Two times a day (BID) | ORAL | Status: DC

## 2015-11-29 NOTE — Progress Notes (Signed)
Patient ID: Bradley [name], male   DOB: [birth year], 60 y.o.   MRN: [id number]    Advanced Heart Failure Clinic Note   Referring Physician: Chanetta Marshall, NP Primary Care: Dr [name] Primary Cardiologist: Dr Dorris Carnes Primary HF: New (Dr. Haroldine Laws)  HPI:  Bradley [name] is a 60 y.o. male with a complicated past medical history including thyroid cancer (s/p thyroidectomy), hypertension, depression, and aortic stenosis for which he underwent aortic vavulotomy at age 32 with subsequent Bentall procedure in 2001 with a pericardial tissue valve. He had MSSA bacteremia and bacterial endocarditis in [id number] complicated by renal failure requiring dialysis for a short period of time. 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   He was admitted 11/2014 with progressive episodes of palpitations and pre-syncope. He was found to have monomorphic ventricular tachycardia. Catheterization demonstrated normal coronaries. Echocardiogram demonstrated EF 30-35% and he underwent STJ dual chamber ICD implant. Mexiletine was added prior to discharge and his Metoprolol was changed to Coreg. He had subsequent ventricular arrhythmias and his Mexelitine was increased with no further VT.  CPX test performed 10/25/15 with ongoing exercise intolerance; showed gas exchange demonstrating a moderate functional impairment when compared to matched sedentary norms. Resting spirometry reveals mild obstruction/restriction. However, at peak exercise patient limited mainly by HF. There was also evidence of chronotropic incompetence.   He presents today to establish with HF clinic. Overall has been feeling ok recently.  Does get indigestion in the morning after taking his medicines, feels like it burns his stomach, so he spaces out his medicine. No problem with his evening medicines. Can get  around a grocery store without needing to take a break, but is tired afterward. Gets  DOE very quickly with stairs.   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 Is very depressed about his condition and the problems that he has had medically. Had a "nervous breakdown" several years ago and spent a week in [facility]. Hasn't seen anyone since.  Takes lasix twice a week, doesn't need any extra. Weight at home 204-205. Watches fluid (less than 2 L a day) and salt.  Does have occasionally chest pain, has been related to VT terminated by pacing in the past.   [facility] interrogated personally: thoracic impedence slightly below threshold but has been stable for the most part. He has had 3 episodes of VT terminated by [facility] since 10/14/15.  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 Lives at home with wife and 3/4 of his kids. 2 sons and 2 daughters.    CPX 10/25/15 FVC 3.47 (66%) FEV1 2.86 (71%) Resting HR 60 Peak 112 Resting BP 130/80 Peak 190/88 Peak V02 16.1 (55.2%) [id number] slope: 35 OUES: 1.69 Peak RER 1.13 Ventilatory threshold 10.9 (37.4%) Peak RR 40 Peak Ventilation 72.2 VE/MVV 51% PETCO2 at peak: 31 O2 pulse 13 (81%)   Echo 04/2013 LVEF 60-65% Echo 08/2013 LVEF 45-50% Echo 11/2013 LVEF 20-25% Echo 02/2014 LVEF 35-40% Echo 12/30/14 LVEF 30-35%, Mild MR, Mod LAE, RV mildly dilated, Mild RAE   Review of Systems: [y] = yes, [ ]  = no   General: Weight gain [ ] ; Weight loss [ ] ; Anorexia [ ] ; Fatigue [y]; Fever [ ] ; Chills [ ] ; Weakness [ ]   Cardiac: Chest pain/pressure [y]; Resting SOB [ ] ; Exertional SOB [y]; Orthopnea [ ] ; Pedal Edema [ ] ; Palpitations [ ] ; Syncope [ ] ;  Presyncope [ ] ; Paroxysmal nocturnal dyspnea[ ]   Pulmonary: Cough [ ] ; Wheezing[ ] ; Hemoptysis[ ] ; Sputum [ ] ; Snoring [ ]   GI: Vomiting[ ] ; Dysphagia[ ] ; Melena[ ] ; Hematochezia [ ] ; Heartburn[ ] ; Abdominal pain [ ] ; Constipation [ ] ; Diarrhea [ ] ; BRBPR [ ]   GU: Hematuria[ ] ; Dysuria [ ] ; Nocturia[ ]     Vascular: Pain in legs with walking [ ] ; Pain in feet with lying flat [ ] ; Non-healing sores [ ] ; Stroke [ ] ; TIA [ ] ; Slurred speech [ ] ;  Neuro: Headaches[ ] ; Vertigo[ ] ; Seizures[ ] ; Paresthesias[ ] ;Blurred vision [ ] ; Diplopia [ ] ; Vision changes [ ]   Ortho/Skin: Arthritis [ ] ; Joint pain [ ] ; Muscle pain [ ] ; Joint swelling [ ] ; Back Pain [ ] ; Rash [ ]   Psych: Depression[y]; Anxiety[ ]   Heme: Bleeding problems [ ] ; Clotting disorders [ ] ; Anemia [ ]   Endocrine: Diabetes [ ] ; Thyroid dysfunction[y]   Past Medical History  Diagnosis Date  . Anxiety   . Depression   . Hypertension   . Hypothyroidism, postsurgical   . Aortic stenosis     s/p Bentall with bioprosthetic AVR 02/2010; Last echo (9/11): Moderate LVH, EF 45-50%, AVR functioning appropriately, aortic valve mean gradient 21, diastolic dysfunction. Chest MRA (2/13): Mild to moderate dilatation at the sinus of Valsalva at 4.1 cm, mild dilatation ascending aorta distal to the tube graft at 3.9 cm, moderate dilatation of the innominate artery a 2.1 cm;    . Hx of cardiac cath     a. LHC in 02/2010: normal cors  . Staphylococcus aureus bacteremia   . Prosthetic valve endocarditis ([place])   . Thyroid cancer ([place])   . ESRD on dialysis [facility])     a. 09/2013 felt to be related to gentamycin b. no longer requiring HD  . Hx of echocardiogram 02/2014    Echo (8/15):  Mod LV, EF 35-40%, Gr 1 DD, AVR ok (mean 14 mmHg), mild LAE  . Ventricular tachycardia ([place])     a. s/p STJ ICD    Current Outpatient Prescriptions  Medication Sig Dispense Refill  . aspirin EC 81 MG tablet Take 81 mg by mouth daily.     Marland Kitchen atorvastatin (LIPITOR) 40 MG tablet TAKE ONE TABLET BY MOUTH ONCE DAILY AT  6PM 90 tablet 0  . betamethasone valerate ointment (VALISONE) 0.1 % APPLY  OINTMENT TO AFFECTED AREA TWICE DAILY 30 g 2  . carvedilol (COREG) 6.25 MG tablet TAKE 1 TABLET IN THE MORNING AND 2 TABLETS AT NIGHT BEFORE BEDTIME 90 tablet 5  . clonazePAM (KLONOPIN) 1 MG  tablet TAKE ONE TABLET BY MOUTH TWICE DAILY AS NEEDED(NOT FOR REGULAR USE). 30 tablet 0  . docusate sodium (STOOL SOFTENER) 100 MG capsule Take 100 mg by mouth 2 (two) times daily.    . furosemide (LASIX) 20 MG tablet Take 1 tablet every Monday and Thursday 30 tablet 11  . levothyroxine (SYNTHROID, LEVOTHROID) 137 MCG tablet TAKE ONE TABLET BY MOUTH ONCE DAILY BEFORE BREAKFAST 90 tablet 0  . lisinopril (PRINIVIL,ZESTRIL) 2.5 MG tablet TAKE ONE TABLET BY MOUTH ONCE DAILY 90 tablet 1  . mexiletine (MEXITIL) 150 MG capsule TAKE 2 CAPSULES BY MOUTH EVERY MORNING AND TAKE 2 CAPSULES EVERY EVENING 360 capsule 3  . Multiple Vitamin (MULTIVITAMIN WITH MINERALS) TABS tablet Take 1 tablet by mouth every other day. Centrum Silver    . Neomycin-Bacitracin-Polymyxin (HCA TRIPLE ANTIBIOTIC OINTMENT EX) 1 application.    Marland Kitchen RANEXA 500 MG 12 hr tablet  TAKE 1 TABLET BY MOUTH TWICE A DAY 60 tablet 5  . sertraline (ZOLOFT) 50 MG tablet TAKE ONE TABLET BY MOUTH ONCE DAILY 30 tablet 11   No current facility-administered medications for this encounter.    Allergies  Allergen Reactions  . Oxycodone Other (See Comments)    Gives patient nightmares  . Rifampin Nausea Only      Social History   Social History  . Marital Status: Married    Spouse Name: N/A  . Number of Children: N/A  . Years of Education: N/A   Occupational History  . Not on file.   Social History Main Topics  . Smoking status: Never Smoker   . Smokeless tobacco: Never Used  . Alcohol Use: No     Comment: Occasional  . Drug Use: No  . Sexual Activity: No   Other Topics Concern  . Not on file   Social History Narrative   Lives at home with walker.  Does not drive.  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 Heart disease Father   . Thyroid cancer Sister   . Thyroid cancer Sister   . Kidney Stones Sister   . Heart attack Neg Hx   . Stroke Father   . Hypertension  Mother   . Hypertension Father     Danley Danker Vitals:   11/29/15 1122  BP: 134/86  Pulse: 63  Weight: 206 lb (93.441 kg)  SpO2: 98%    PHYSICAL EXAM: General:  Well appearing. Tearful at times HEENT: normal Neck: supple. JVD 6-7. Carotids 2+ bilat; no bruits. No thyromegaly or nodule noted. Cor: PMI nondisplaced. Regular rate & rhythm. 1-2/6 MR murmur Lungs: CTAB, normal effort Abdomen: soft, NT, ND, no HSM. No bruits or masses. +BS  Extremities: no cyanosis, clubbing, rash, edema Neuro: alert & oriented x 3, cranial nerves grossly intact. moves all 4 extremities w/o difficulty. Affect pleasant.   ASSESSMENT & PLAN:  1. Chronic systolic HF - Echo [id number] LVEF 30-35%, Mild MR, Mod LAE, RV mildly dilated, Mild RAE.  2/2 NICM - NYHA class II-early III 2. VT/NICM 3. HTN 4. Valvular heart disease s/p bentall in 2001 and re-do bentall 2015 5. Depression 6. Deconditioning  Volume status stable on exam.  CPX with moderate HF limitation.   Will repeat Echo.  Stop Lisinopril.  Start Entresto 24/26 after 36 hr washout. (Wednesday evening). Check BMET in 10 days.   He has had 3 episodes of VT terminated by [facility] since 10/14/15. Currently on mexitil and ranexa per EP.  S/p bioprosthetic AV re-do 2015 . Valve stable on previous echo. Will re-assess upcoming Echo  He refuses counseling at this time. Doesn't think zoloft is working anymore.  Has short temper, wants to be alone, worsening apathy, and tearful spells.  Encouraged to follow up with PCP about trying a different medicine and continue to consider follow up with Psych for CBT.   Will refer to HF exercise program at [facility].   Follow up 6-8 weeks  Beryle Beams" [place], [place] 11/29/2015 12:08 PM   Patient seen and examined with Oda Kilts, PA-C. We discussed all aspects of the encounter. I agree with the assessment and plan as stated above.   Records reviewed in detail including CPX testing. Overall he is improved from HF  standpoint. NYHA II-III. CPX with moderate HF limitation.Volume status ok. Will switch lisinopril to Entresto. Refer to  HF program at [employer] [facility]. Repeat echo. Long talk about role of depression and heart disease.   Total time spent 45 minutes. Over half that time spent discussing above.   Crisanto Nied,MD 1:50 PM

## 2015-11-29 NOTE — Progress Notes (Signed)
Advanced Heart Failure Medication Review by a Pharmacist  Does the patient  feel that his/her medications are working for him/her?  yes  Has the patient been experiencing any side effects to the medications prescribed?  no  Does the patient measure his/her own blood pressure or blood glucose at home?  no   Does the patient have any problems obtaining medications due to transportation or finances?   no  Understanding of regimen: good Understanding of indications: good Potential of compliance: fair Patient understands to avoid NSAIDs. Patient understands to avoid decongestants.  Issues to address at subsequent visits: Compliance   Pharmacist comments:  Bradley [name] is a 60 yo M who asked me to speak with his wife who was in the lobby about his medications. She was able to verify his regimen for me but does admit to him not always taking his medications as prescribed 2/2 "bad or manic days". She did not have any specific medication-related questions or concerns for me at this time.   Ruta Hinds. Velva Harman, PharmD, BCPS, CPP Clinical Pharmacist Pager: [phone] Phone: [phone] 11/29/2015 11:36 AM      Time with patient: 10 minutes Preparation and documentation time: 2 minutes Total time: 12 minutes

## 2015-11-29 NOTE — Patient Instructions (Signed)
Stop Lisinopril  Start Entresto 24/26 mg Twice daily STARTING WED PM  Labs in 10 days  Your physician has requested that you have an echocardiogram. Echocardiography is a painless test that uses sound waves to create images of your heart. It provides your doctor with information about the size and shape of your heart and how well your heart's chambers and valves are working. This procedure takes approximately one hour. There are no restrictions for this procedure.  Your physician recommends that you schedule a follow-up appointment in: 6-8 weeks

## 2015-12-01 ENCOUNTER — Telehealth (HOSPITAL_COMMUNITY): Payer: Self-pay | Admitting: Pharmacist

## 2015-12-01 NOTE — Telephone Encounter (Signed)
Entresto 24-26 mg BID PA approved by [facility] thorugh 11/29/2017.   Ruta Hinds. Velva Harman, PharmD, BCPS, CPP Clinical Pharmacist Pager: [phone] Phone: [phone] 12/01/2015 10:32 AM

## 2015-12-06 ENCOUNTER — Ambulatory Visit (HOSPITAL_COMMUNITY)
Admission: RE | Admit: 2015-12-06 | Discharge: 2015-12-06 | Disposition: A | Discharge status: Home / Self Care | Payer: Commercial Managed Care - HMO | Source: Ambulatory Visit | Attending: Internal Medicine | Admitting: Internal Medicine

## 2015-12-06 DIAGNOSIS — Z95810 Presence of automatic (implantable) cardiac defibrillator: Secondary | ICD-10-CM

## 2015-12-06 LAB — BASIC METABOLIC PANEL
Anion gap: 8 (ref 5–15)
BUN: 17 mg/dL (ref 6–20)
CO2: 25 mmol/L (ref 22–32)
Calcium: 9.4 mg/dL (ref 8.9–10.3)
Chloride: 107 mmol/L (ref 101–111)
Creatinine, Ser: 1.34 mg/dL — ABNORMAL HIGH (ref 0.61–1.24)
GFR calc Af Amer: >60 mL/min (ref >60)
GFR calc non Af Amer: 57 mL/min — ABNORMAL LOW (ref >60)
Glucose, Bld: 76 mg/dL (ref 65–99)
Potassium: 5.2 mmol/L — ABNORMAL HIGH (ref 3.5–5.1)
Sodium: 140 mmol/L (ref 135–145)

## 2015-12-15 ENCOUNTER — Other Ambulatory Visit: Payer: Self-pay

## 2015-12-15 ENCOUNTER — Encounter: Payer: Self-pay | Admitting: Internal Medicine

## 2015-12-15 ENCOUNTER — Ambulatory Visit (HOSPITAL_COMMUNITY): Payer: Commercial Managed Care - HMO | Attending: Cardiology

## 2015-12-15 DIAGNOSIS — I5042 Chronic combined systolic (congestive) and diastolic (congestive) heart failure: Secondary | ICD-10-CM | POA: Diagnosis not present

## 2015-12-15 DIAGNOSIS — Z953 Presence of xenogenic heart valve: Secondary | ICD-10-CM | POA: Diagnosis not present

## 2015-12-15 DIAGNOSIS — I110 Hypertensive heart disease with heart failure: Secondary | ICD-10-CM | POA: Insufficient documentation

## 2015-12-16 DIAGNOSIS — I5042 Chronic combined systolic (congestive) and diastolic (congestive) heart failure: Secondary | ICD-10-CM | POA: Diagnosis not present

## 2015-12-20 ENCOUNTER — Ambulatory Visit (HOSPITAL_COMMUNITY)
Admission: RE | Admit: 2015-12-20 | Discharge: 2015-12-20 | Disposition: A | Discharge status: Home / Self Care | Payer: Commercial Managed Care - HMO | Source: Ambulatory Visit | Attending: Cardiology | Admitting: Cardiology

## 2015-12-20 DIAGNOSIS — I5042 Chronic combined systolic (congestive) and diastolic (congestive) heart failure: Secondary | ICD-10-CM

## 2015-12-20 LAB — BASIC METABOLIC PANEL
Anion gap: 7 (ref 5–15)
BUN: 17 mg/dL (ref 6–20)
CO2: 26 mmol/L (ref 22–32)
Calcium: 9.6 mg/dL (ref 8.9–10.3)
Chloride: 109 mmol/L (ref 101–111)
Creatinine, Ser: 1.37 mg/dL — ABNORMAL HIGH (ref 0.61–1.24)
GFR calc Af Amer: >60 mL/min (ref >60)
GFR calc non Af Amer: 55 mL/min — ABNORMAL LOW (ref >60)
Glucose, Bld: 64 mg/dL — ABNORMAL LOW (ref 65–99)
Potassium: 4.1 mmol/L (ref 3.5–5.1)
Sodium: 142 mmol/L (ref 135–145)

## 2016-01-03 ENCOUNTER — Other Ambulatory Visit: Payer: Self-pay | Admitting: Family Medicine

## 2016-01-16 DIAGNOSIS — I5042 Chronic combined systolic (congestive) and diastolic (congestive) heart failure: Secondary | ICD-10-CM | POA: Diagnosis not present

## 2016-01-17 ENCOUNTER — Ambulatory Visit (INDEPENDENT_AMBULATORY_CARE_PROVIDER_SITE_OTHER): Payer: Commercial Managed Care - HMO | Admitting: *Deleted

## 2016-01-17 DIAGNOSIS — I429 Cardiomyopathy, unspecified: Secondary | ICD-10-CM | POA: Diagnosis not present

## 2016-01-17 DIAGNOSIS — I428 Other cardiomyopathies: Secondary | ICD-10-CM

## 2016-01-17 NOTE — Progress Notes (Signed)
Remote ICD transmission.   

## 2016-01-18 LAB — CUP PACEART REMOTE DEVICE CHECK
Battery Remaining Longevity: 82 mo
Battery Remaining Percentage: 80 %
Battery Voltage: 2.98 V
Brady Statistic RV Percent Paced: 1 %
Date Time Interrogation Session: 20170619060022
HighPow Impedance: 78 Ohm
HighPow Impedance: 78 Ohm
Implantable Lead Implant Date: 20160603
Implantable Lead Location: 753860
Implantable Lead Model: 7122
Lead Channel Impedance Value: 410 Ohm
Lead Channel Pacing Threshold Amplitude: 1 V
Lead Channel Pacing Threshold Pulse Width: 0.5 ms
Lead Channel Sensing Intrinsic Amplitude: 11.7 mV
Lead Channel Setting Pacing Amplitude: 2.5 V
Lead Channel Setting Pacing Pulse Width: 0.5 ms
Lead Channel Setting Sensing Sensitivity: 0.5 mV
Pulse Gen Serial Number: 7135169

## 2016-01-20 ENCOUNTER — Encounter: Payer: Self-pay | Admitting: Cardiology

## 2016-01-26 ENCOUNTER — Encounter (HOSPITAL_COMMUNITY): Payer: Self-pay | Admitting: Emergency Medicine

## 2016-01-26 ENCOUNTER — Encounter: Payer: Self-pay | Admitting: Internal Medicine

## 2016-01-26 ENCOUNTER — Encounter (HOSPITAL_COMMUNITY): Payer: Self-pay | Admitting: Internal Medicine

## 2016-01-26 ENCOUNTER — Ambulatory Visit (HOSPITAL_BASED_OUTPATIENT_CLINIC_OR_DEPARTMENT_OTHER)
Admission: RE | Admit: 2016-01-26 | Discharge: 2016-01-26 | Disposition: A | Discharge status: Home / Self Care | Payer: Commercial Managed Care - HMO | Source: Ambulatory Visit | Attending: Internal Medicine | Admitting: Internal Medicine

## 2016-01-26 ENCOUNTER — Telehealth: Payer: Self-pay | Admitting: Internal Medicine

## 2016-01-26 VITALS — BP 132/88 | HR 59 | Ht 73.0 in | Wt 203.0 lb

## 2016-01-26 DIAGNOSIS — F419 Anxiety disorder, unspecified: Secondary | ICD-10-CM | POA: Insufficient documentation

## 2016-01-26 DIAGNOSIS — Z79899 Other long term (current) drug therapy: Secondary | ICD-10-CM | POA: Diagnosis not present

## 2016-01-26 DIAGNOSIS — I472 Ventricular tachycardia, unspecified: Secondary | ICD-10-CM

## 2016-01-26 DIAGNOSIS — I5022 Chronic systolic (congestive) heart failure: Secondary | ICD-10-CM

## 2016-01-26 DIAGNOSIS — Z954 Presence of other heart-valve replacement: Secondary | ICD-10-CM | POA: Diagnosis not present

## 2016-01-26 DIAGNOSIS — Z85850 Personal history of malignant neoplasm of thyroid: Secondary | ICD-10-CM | POA: Insufficient documentation

## 2016-01-26 DIAGNOSIS — Z992 Dependence on renal dialysis: Secondary | ICD-10-CM | POA: Insufficient documentation

## 2016-01-26 DIAGNOSIS — Z7982 Long term (current) use of aspirin: Secondary | ICD-10-CM

## 2016-01-26 DIAGNOSIS — Y999 Unspecified external cause status: Secondary | ICD-10-CM | POA: Diagnosis not present

## 2016-01-26 DIAGNOSIS — Y929 Unspecified place or not applicable: Secondary | ICD-10-CM | POA: Diagnosis not present

## 2016-01-26 DIAGNOSIS — I350 Nonrheumatic aortic (valve) stenosis: Secondary | ICD-10-CM | POA: Insufficient documentation

## 2016-01-26 DIAGNOSIS — Z952 Presence of prosthetic heart valve: Secondary | ICD-10-CM | POA: Diagnosis not present

## 2016-01-26 DIAGNOSIS — I132 Hypertensive heart and chronic kidney disease with heart failure and with stage 5 chronic kidney disease, or end stage renal disease: Secondary | ICD-10-CM

## 2016-01-26 DIAGNOSIS — I120 Hypertensive chronic kidney disease with stage 5 chronic kidney disease or end stage renal disease: Secondary | ICD-10-CM | POA: Insufficient documentation

## 2016-01-26 DIAGNOSIS — S61031A Puncture wound without foreign body of right thumb without damage to nail, initial encounter: Secondary | ICD-10-CM | POA: Diagnosis not present

## 2016-01-26 DIAGNOSIS — I38 Endocarditis, valve unspecified: Secondary | ICD-10-CM | POA: Insufficient documentation

## 2016-01-26 DIAGNOSIS — Y9389 Activity, other specified: Secondary | ICD-10-CM | POA: Insufficient documentation

## 2016-01-26 DIAGNOSIS — W5911XA Bitten by nonvenomous snake, initial encounter: Secondary | ICD-10-CM | POA: Diagnosis not present

## 2016-01-26 DIAGNOSIS — I5042 Chronic combined systolic (congestive) and diastolic (congestive) heart failure: Secondary | ICD-10-CM | POA: Diagnosis not present

## 2016-01-26 DIAGNOSIS — F329 Major depressive disorder, single episode, unspecified: Secondary | ICD-10-CM | POA: Insufficient documentation

## 2016-01-26 DIAGNOSIS — Z808 Family history of malignant neoplasm of other organs or systems: Secondary | ICD-10-CM | POA: Insufficient documentation

## 2016-01-26 DIAGNOSIS — E890 Postprocedural hypothyroidism: Secondary | ICD-10-CM

## 2016-01-26 DIAGNOSIS — N186 End stage renal disease: Secondary | ICD-10-CM | POA: Insufficient documentation

## 2016-01-26 DIAGNOSIS — S61051A Open bite of right thumb without damage to nail, initial encounter: Secondary | ICD-10-CM | POA: Diagnosis not present

## 2016-01-26 MED ORDER — SACUBITRIL-VALSARTAN 49-51 MG PO TABS: 1.0000 | ORAL_TABLET | Freq: Two times a day (BID) | ORAL | Status: DC

## 2016-01-26 NOTE — Addendum Note (Signed)
Encounter addended by: Effie Berkshire, RN on: 01/26/2016  3:11 PM<BR>     Documentation filed: Medications, Orders, Patient Instructions Section

## 2016-01-26 NOTE — Progress Notes (Signed)
Patient ID: KALET VANWYE, male   DOB: Sep 30, 1955, 60 y.o.   MRN: [id number] Patient ID: JESIEL LOBATO, male   DOB: 09-27-1955, 60 y.o.   MRN: [id number]    Advanced Heart Failure Clinic Note   Referring Physician: Chanetta Marshall, NP Primary Care: Dr [name] Primary Cardiologist: Dr Dorris Carnes Primary HF: New (Dr. Haroldine Laws)  HPI:  YOBANY VALENZANO is a 60 y.o. male with a complicated past medical history including thyroid cancer (s/p thyroidectomy), hypertension, depression, and aortic stenosis for which he underwent aortic vavulotomy at age 69 with subsequent Bentall procedure in 2001 with a pericardial tissue valve. He had MSSA bacteremia and bacterial endocarditis in [id number] complicated by renal failure requiring dialysis for a short period of time. 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   He was admitted 11/2014 with progressive episodes of palpitations and pre-syncope. He was found to have monomorphic ventricular tachycardia. Catheterization demonstrated normal coronaries. Echocardiogram demonstrated EF 30-35% and he underwent STJ dual chamber ICD implant. Mexiletine was added prior to discharge and his Metoprolol was changed to Coreg. He had subsequent ventricular arrhythmias and his Mexelitine was increased with no further VT.  CPX test performed 10/25/15 with ongoing exercise intolerance; showed gas exchange demonstrating a moderate functional impairment when compared to matched sedentary norms. Resting spirometry reveals mild obstruction/restriction. However, at peak exercise patient limited mainly by HF. There was also evidence of chronotropic incompetence.   He presents for follow-up. Overall has been feeling ok recently. At last visit switched to [facility]. Able to ADLs and go to the store without too much problem but gets DOE if he tries  to do more. No CP. Feels tired. Family says he is moody and depressed. Went to [employer] but got moody and cancelled. Weigh stable around 205. No edema, orthopnea or PND. BP improved with systolics in [id number].   [facility] interrogated personally: thoracic impedence slightly below threshold but has been stable for the most part. He has had 3 episodes of VT terminated by [facility] since 10/14/15.  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 Lives at home with wife and 3/4 of his kids. 2 sons and 2 daughters.    CPX 10/25/15 FVC 3.47 (66%) FEV1 2.86 (71%) Resting HR 60 Peak 112 Resting BP 130/80 Peak 190/88 Peak V02 16.1 (55.2%) [id number] slope: 35 OUES: 1.69 Peak RER 1.13 Ventilatory threshold 10.9 (37.4%) Peak RR 40 Peak Ventilation 72.2 VE/MVV 51% PETCO2 at peak: 31 O2 pulse 13 (81%)   Echo 04/2013 LVEF 60-65% Echo 08/2013 LVEF 45-50% Echo 11/2013 LVEF 20-25% Echo 02/2014 LVEF 35-40% Echo 12/30/14 LVEF 30-35%, Mild MR, Mod LAE, RV mildly dilated, Mild RAE Echo 5/17 25-30%     Past Medical History  Diagnosis Date  . Anxiety   . Depression   . Hypertension   . Hypothyroidism, postsurgical   . Aortic stenosis     s/p Bentall with bioprosthetic AVR 02/2010; Last echo (9/11): Moderate LVH, EF 45-50%, AVR functioning appropriately, aortic valve mean gradient 21, diastolic dysfunction. Chest MRA (2/13): Mild to moderate dilatation at the sinus of Valsalva at 4.1 cm, mild dilatation ascending aorta distal to the tube graft at 3.9 cm, moderate dilatation of the innominate artery a 2.1 cm;    . Hx of cardiac cath     a. LHC in 02/2010: normal cors  . Staphylococcus aureus bacteremia   . Prosthetic  valve endocarditis ([place])   . Thyroid cancer ([place])   . ESRD on dialysis [facility])     a. 09/2013 felt to be related to gentamycin b. no longer requiring HD  . Hx of echocardiogram 02/2014    Echo (8/15):  Mod LV, EF 35-40%, Gr 1 DD, AVR ok (mean 14 mmHg), mild LAE  . Ventricular tachycardia ([place])     a. s/p  STJ ICD    Current Outpatient Prescriptions  Medication Sig Dispense Refill  . aspirin EC 81 MG tablet Take 81 mg by mouth daily.     Marland Kitchen atorvastatin (LIPITOR) 40 MG tablet TAKE ONE TABLET BY MOUTH ONCE DAILY AT  6PM 90 tablet 0  . betamethasone valerate ointment (VALISONE) 0.1 % APPLY  OINTMENT TO AFFECTED AREA TWICE DAILY 30 g 2  . carvedilol (COREG) 6.25 MG tablet TAKE 1 TABLET IN THE MORNING AND 2 TABLETS AT NIGHT BEFORE BEDTIME 90 tablet 5  . clonazePAM (KLONOPIN) 1 MG tablet TAKE ONE TABLET BY MOUTH TWICE DAILY AS NEEDED(NOT FOR REGULAR USE). 30 tablet 0  . docusate sodium (STOOL SOFTENER) 100 MG capsule Take 100 mg by mouth 2 (two) times daily.    . furosemide (LASIX) 20 MG tablet Take 1 tablet every Monday and Thursday 30 tablet 11  . levothyroxine (SYNTHROID, LEVOTHROID) 137 MCG tablet TAKE ONE TABLET BY MOUTH ONCE DAILY BEFORE BREAKFAST 90 tablet 0  . mexiletine (MEXITIL) 150 MG capsule TAKE 2 CAPSULES BY MOUTH EVERY MORNING AND TAKE 2 CAPSULES EVERY EVENING 360 capsule 3  . Multiple Vitamin (MULTIVITAMIN WITH MINERALS) TABS tablet Take 1 tablet by mouth every other day. Centrum Silver    . Neomycin-Bacitracin-Polymyxin (HCA TRIPLE ANTIBIOTIC OINTMENT EX) 1 application.    Marland Kitchen RANEXA 500 MG 12 hr tablet TAKE 1 TABLET BY MOUTH TWICE A DAY 60 tablet 5  . sacubitril-valsartan (ENTRESTO) 24-26 MG Take 1 tablet by mouth 2 (two) times daily. 60 tablet 3  . sertraline (ZOLOFT) 50 MG tablet TAKE ONE TABLET BY MOUTH ONCE DAILY 30 tablet 11   No current facility-administered medications for this encounter.    Allergies  Allergen Reactions  . Oxycodone Other (See Comments)    Gives patient nightmares  . Rifampin Nausea Only      Social History   Social History  . Marital Status: Married    Spouse Name: N/A  . Number of Children: N/A  . Years of Education: N/A   Occupational History  . Not on file.   Social History Main Topics  . Smoking status: Never Smoker   . Smokeless  tobacco: Never Used  . Alcohol Use: No     Comment: Occasional  . Drug Use: No  . Sexual Activity: No   Other Topics Concern  . Not on file   Social History Narrative   Lives at home with walker.  Does not drive.  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 Heart disease Father   . Thyroid cancer Sister   . Thyroid cancer Sister   . Kidney Stones Sister   . Heart attack Neg Hx   . Stroke Father   . Hypertension Mother   . Hypertension Father     Filed Vitals:   01/26/16 1414  BP: 132/88  Pulse: 59  Height: 6\' 1"  (1.854 m)  Weight: 203 lb (92.08 kg)  SpO2: 98%    PHYSICAL EXAM: General:  Well appearing.  HEENT: normal Neck: supple. JVP 5-6. Carotids 2+ bilat; no bruits. No thyromegaly or nodule noted. Cor: PMI nondisplaced. Regular rate & rhythm. 2/6 SEM HSB Lungs: CTAB, normal effort Abdomen: soft, NT, ND, no HSM. No bruits or masses. +BS  Extremities: no cyanosis, clubbing, rash, edema Neuro: alert & oriented x 3, cranial nerves grossly intact. moves all 4 extremities w/o difficulty. Affect pleasant.   ASSESSMENT & PLAN:  1. Chronic systolic HF - Echo [id number] LVEF 25-30%, Mild MR, Mod LAE, RV mildly dilated, Mild RAE.  2/2 NICM - NYHA class II-early III - s/p SJ ICD 2. VT/NICM - On mexilitene/Ranexa 3. HTN 4. Valvular heart disease s/p bentall in 2001 and re-do bentall 2015 5. Depression 6. Deconditioning.   Overall he is doing well from HF standpoint. NYHA II-III. CPX with moderate HF limitation.Volume status look. Increase Entresto to 49/51. HR too low to increase b-blocker. Long talk about role of depression and heart disease. Encouraged going to [employer] and seeking consistent counseling for his mood. BMET and BNP today.   Colm Lyford,MD 2:26 PM

## 2016-01-26 NOTE — Patient Instructions (Signed)
INCREASE Entresto to 49/51 mg tablet twice daily.  Routine lab work today. Will notify you of abnormal results, otherwise no news is good news!  Follow up 3 months with Dr. Haroldine Laws.  Do the following things EVERYDAY: 1) Weigh yourself in the morning before breakfast. Write it down and keep it in a log. 2) Take your medicines as prescribed 3) Eat low salt foods--Limit salt (sodium) to 2000 mg per day.  4) Stay as active as you can everyday 5) Limit all fluids for the day to less than 2 liters

## 2016-01-26 NOTE — ED Notes (Signed)
Pt tried to pick a snake up out of his chicken pen and got bit on his right hand. Pt states the snake was black with a white belly. Pt has two bite marks on right hand.

## 2016-01-26 NOTE — Telephone Encounter (Signed)
Cardiology On Call  Patient called. Bitten by a black snake on his thumb. He tried to reach [facility] poison control without much success. So his wife is taking him to ER for evaluation. Denies angina, SOB, N/V. Will defer further management for this snake bite to ER providers.   Wandra Mannan, MD

## 2016-01-27 ENCOUNTER — Emergency Department (HOSPITAL_COMMUNITY)
Admission: EM | Admit: 2016-01-27 | Discharge: 2016-01-27 | Disposition: A | Discharge status: Home / Self Care | Payer: Commercial Managed Care - HMO | Attending: Emergency Medicine | Admitting: Emergency Medicine

## 2016-01-27 ENCOUNTER — Telehealth (HOSPITAL_COMMUNITY): Payer: Self-pay | Admitting: Vascular Surgery

## 2016-01-27 DIAGNOSIS — T63001A Toxic effect of unspecified snake venom, accidental (unintentional), initial encounter: Secondary | ICD-10-CM

## 2016-01-27 NOTE — Discharge Instructions (Signed)
Keep area clean and dry.  Return here as needed.  Follow-up with your primary care doctor

## 2016-01-27 NOTE — ED Provider Notes (Signed)
CSN: [id number]     Arrival date & time 01/26/16  2235 History   First MD Initiated Contact with Patient 01/27/16 0054     Chief Complaint  Patient presents with  . Snake Bite     (Consider location/radiation/quality/duration/timing/severity/associated sxs/prior Treatment) HPI Patient presents to the emergency department with a snake bite to the right hand.  The patient states that he was getting a snake out of his chicken coop when the snake bit him on the right hand just at the base of the thumb.  Patient states that as a black snake with a white belly.  The patient has no pain at this time in the area.The patient denies chest pain, shortness of breath, headache,blurred vision, neck pain, fever, cough, weakness, numbness, dizziness, anorexia, edema, abdominal pain, nausea, vomiting, diarrhea, rash, back pain, dysuria, hematemesis, bloody stool, near syncope, or syncope. Past Medical History  Diagnosis Date  . Anxiety   . Depression   . Hypertension   . Hypothyroidism, postsurgical   . Aortic stenosis     s/p Bentall with bioprosthetic AVR 02/2010; Last echo (9/11): Moderate LVH, EF 45-50%, AVR functioning appropriately, aortic valve mean gradient 21, diastolic dysfunction. Chest MRA (2/13): Mild to moderate dilatation at the sinus of Valsalva at 4.1 cm, mild dilatation ascending aorta distal to the tube graft at 3.9 cm, moderate dilatation of the innominate artery a 2.1 cm;    . Hx of cardiac cath     a. LHC in 02/2010: normal cors  . Staphylococcus aureus bacteremia   . Prosthetic valve endocarditis ([place])   . Thyroid cancer ([place])   . ESRD on dialysis [facility])     a. 09/2013 felt to be related to gentamycin b. no longer requiring HD  . Hx of echocardiogram 02/2014    Echo (8/15):  Mod LV, EF 35-40%, Gr 1 DD, AVR ok (mean 14 mmHg), mild LAE  . Ventricular tachycardia ([place])     a. s/p STJ ICD   Past Surgical History  Procedure Laterality Date  . Cardiac catheterization  03/02/2010    NORMAL  CORONARY ARTERY  . Sternotomy      REDO  . Transthoracic echocardiogram  03/2010    SHOWED MILD REDUCTION OF LV FUNCTION  . Thyroidectomy  ~ 2005  . Shoulder arthroscopy w/ rotator cuff repair Right 2012  . Av fistula placement Left 10/08/2013    Procedure: ARTERIOVENOUS (AV) FISTULA CREATION- LEFT ARM; Radial Cephalic ;  Surgeon: Mal [name], MD;  Location: [place];  Service: Vascular;  Laterality: Left;  . Aortic valve replacement  2011  . Aortic valve repair  1968  . Tee without cardioversion N/A 10/24/2013    Procedure: TRANSESOPHAGEAL ECHOCARDIOGRAM (TEE);  Surgeon: Dorothy Spark, MD;  Location: [facility] ENDOSCOPY;  Service: Cardiovascular;  Laterality: N/A;  Deneen Harts procedure N/A 10/28/2013    Procedure: REDO BENTALL PROCEDURE, debridment of aoritc root abscess, replacement of aortic root, ascending aorta and aortic valve with homograft. Insertion of left femoral arterial line;  Surgeon: [name], MD;  Location: [place];  Service: Open Heart Surgery;  Laterality: N/A;  . Intraoperative transesophageal echocardiogram N/A 10/28/2013    Procedure: INTRAOPERATIVE TRANSESOPHAGEAL ECHOCARDIOGRAM;  Surgeon: [name], MD;  Location: [place];  Service: Open Heart Surgery;  Laterality: N/A;  . Cardiac valve replacement    . Cardiac catheterization N/A 01/01/2015    Procedure: Coronary/Graft Angiography;  Surgeon: Troy Sine, MD;  Location: [place] CV LAB;  Service: Cardiovascular;  Laterality: N/A;  . Peripheral vascular catheterization N/A 01/01/2015    Procedure: Aortic Arch Angiography;  Surgeon: Troy Sine, MD;  Location: [place] CV LAB;  Service: Cardiovascular;  Laterality: N/A;  . Ep implantable device N/A 01/01/2015    Procedure: ICD Implant;  Surgeon: Deboraha Sprang, MD;  Location: [place] CV LAB;  Service: Cardiovascular;  Laterality: N/A;   Family History  Problem Relation Age of Onset  . Heart disease Father   . Thyroid cancer Sister   . Thyroid cancer  Sister   . Kidney Stones Sister   . Heart attack Neg Hx   . Stroke Father   . Hypertension Mother   . Hypertension Father    Social History  Substance Use Topics  . Smoking status: Never Smoker   . Smokeless tobacco: Never Used  . Alcohol Use: No     Comment: Occasional    Review of Systems All other systems negative except as documented in the HPI. All pertinent positives and negatives as reviewed in the HPI.  Allergies  Oxycodone and Rifampin  Home Medications   Prior to Admission medications   Medication Sig Start Date End Date Taking? Authorizing Provider  aspirin EC 81 MG tablet Take 81 mg by mouth daily.    Yes Historical Provider, MD  atorvastatin (LIPITOR) 40 MG tablet TAKE ONE TABLET BY MOUTH ONCE DAILY AT  6PM 06/07/15  Yes [name], MD  betamethasone valerate ointment (VALISONE) 0.1 % APPLY  OINTMENT TO AFFECTED AREA TWICE DAILY 09/21/15  Yes Fay Records, MD  carvedilol (COREG) 6.25 MG tablet TAKE 1 TABLET IN THE MORNING AND 2 TABLETS AT NIGHT BEFORE BEDTIME 10/05/15  Yes Fay Records, MD  clonazePAM (KLONOPIN) 1 MG tablet TAKE ONE TABLET BY MOUTH TWICE DAILY AS NEEDED(NOT FOR REGULAR USE). 09/20/15  Yes [name], MD  docusate sodium (STOOL SOFTENER) 100 MG capsule Take 100 mg by mouth 2 (two) times daily.   Yes Historical Provider, MD  furosemide (LASIX) 20 MG tablet Take 1 tablet every Monday and Thursday 07/12/15  Yes Fay Records, MD  levothyroxine (SYNTHROID, LEVOTHROID) 137 MCG tablet TAKE ONE TABLET BY MOUTH ONCE DAILY BEFORE BREAKFAST 08/18/15  Yes [name], MD  mexiletine (MEXITIL) 150 MG capsule TAKE 2 CAPSULES BY MOUTH EVERY MORNING AND TAKE 2 CAPSULES EVERY EVENING 07/12/15  Yes Fay Records, MD  Multiple Vitamin (MULTIVITAMIN WITH MINERALS) TABS tablet Take 1 tablet by mouth every other day. Centrum Silver   Yes Historical Provider, MD  Neomycin-Bacitracin-Polymyxin (HCA TRIPLE ANTIBIOTIC OINTMENT EX) Apply 1 application topically 2  (two) times daily.  01/27/15  Yes Historical Provider, MD  RANEXA 500 MG 12 hr tablet TAKE 1 TABLET BY MOUTH TWICE A DAY 10/05/15  Yes Fay Records, MD  sacubitril-valsartan (ENTRESTO) 49-51 MG Take 1 tablet by mouth 2 (two) times daily. 01/26/16  Yes Jolaine Artist, MD  sertraline (ZOLOFT) 50 MG tablet TAKE ONE TABLET BY MOUTH ONCE DAILY 05/04/15  Yes [name], MD   BP 146/94 mmHg  Pulse 60  Temp(Src) 98.7 F (37.1 C) (Oral)  Resp 20  Ht 6\' 1"  (1.854 m)  Wt 93.895 kg  BMI 27.32 kg/m2  SpO2 100% Physical Exam  Constitutional: He is oriented to person, place, and time. He appears well-developed and well-nourished. No distress.  HENT:  Head: Normocephalic and atraumatic.  Mouth/Throat: Oropharynx is clear and moist.  Eyes: Pupils are equal, round, and reactive to  light.  Neck: Normal range of motion. Neck supple.  Cardiovascular: Normal rate, regular rhythm and normal heart sounds.  Exam reveals no gallop and no friction rub.   No murmur heard. Pulmonary/Chest: Effort normal and breath sounds normal. No respiratory distress. He has no wheezes.  Musculoskeletal:       Hands: Neurological: He is alert and oriented to person, place, and time. He exhibits normal muscle tone. Coordination normal.  Skin: Skin is warm and dry. No rash noted. No erythema.  Psychiatric: He has a normal mood and affect. His behavior is normal.  Nursing note and vitals reviewed.   ED Course  Procedures (including critical care time) Labs Review Labs Reviewed - No data to display  Imaging Review No results found. I have personally reviewed and evaluated these images and lab results as part of my medical decision-making.  She is vascular area clean and dry.  Told to return here as needed.  Patient agrees the plan and all questions were answered.  Advised follow-up with his primary care doctor  Dalia Heading, PA-C [phone]  Delora Fuel, MD [id number]

## 2016-01-27 NOTE — Telephone Encounter (Signed)
Pt left , no checking, called pt to make 3 month f/u appt

## 2016-01-27 NOTE — ED Notes (Signed)
PA at bedside at this time.  

## 2016-02-03 ENCOUNTER — Encounter: Payer: Self-pay | Admitting: Cardiology

## 2016-02-11 IMAGING — US IR FLUORO GUIDE CV MIDLINE PICC *L*
1 series · 2 of 2 positions shown · non-contrast
Comparison: none

INDICATION: 57-year-old male with history of peri valvular abscess in need of
durable IV access for outpatient intravenous antibiotic therapy.

[Series 1: ir fluoro guide cv midline picc *left* · 2 of 2 slices shown]
[im 1/2]
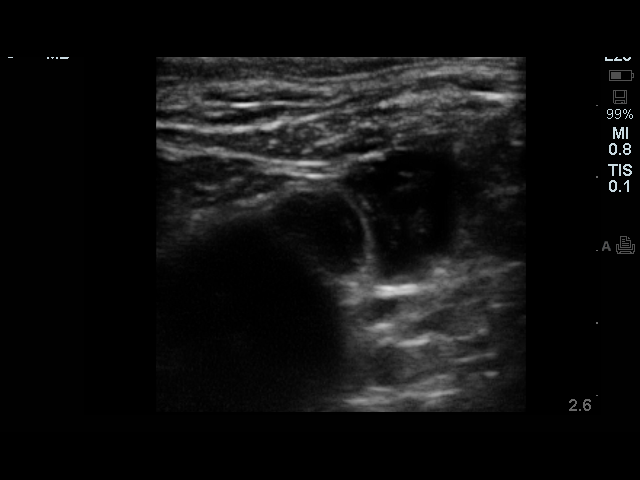
[im 2/2]
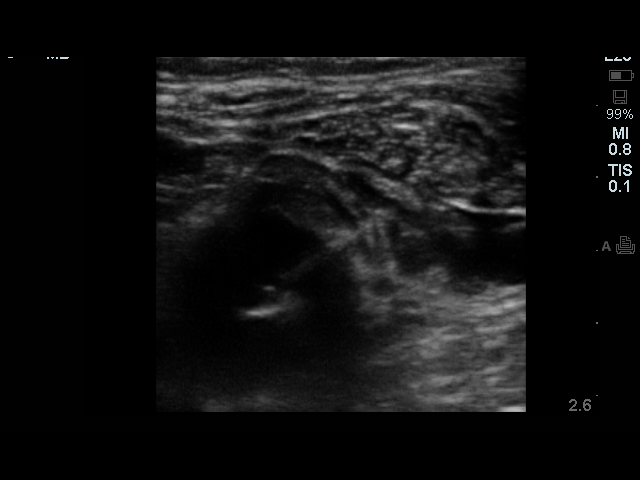

[2 of 2 positions shown; findings below may reference images not displayed]

EXAM:
TUNNELED CENTRAL VENOUS HEMODIALYSIS CATHETER PLACEMENT WITH
ULTRASOUND AND FLUOROSCOPIC GUIDANCE

MEDICATIONS:
None

CONTRAST:  None

ANESTHESIA/SEDATION:
None required

FLUOROSCOPY TIME:  12 seconds

COMPLICATIONS:
None immediate

PROCEDURE:
Informed written consent was obtained from the patient after a
discussion of the risks, benefits, and alternatives to treatment.
Questions regarding the procedure were encouraged and answered. The
left neck and chest were prepped with chlorhexidine in a sterile
fashion, and a sterile drape was applied covering the operative
field. Maximum barrier sterile technique with sterile gowns and
gloves were used for the procedure. A timeout was performed prior to
the initiation of the procedure.

After creating a small venotomy incision, a micropuncture kit was
utilized to access the left vein under direct, real-time ultrasound
guidance after the overlying soft tissues were anesthetized with 1%
lidocaine with epinephrine. Ultrasound image documentation was
performed. The microwire was kinked to measure appropriate catheter
length. A stiff glidewire was advanced to the level of the IVC and
the micropuncture sheath was exchanged for a peel-away sheath. A 23
cm tunneled power PICC was tunneled in a retrograde fashion from the
anterior chest wall to the venotomy incision.

The catheter was then placed through the peel-away sheath with tip
ultimately positioned within the superior aspect of the right
atrium. Final catheter positioning was confirmed and documented with
a spot radiographic image. The catheter aspirates and flushes
normally. The catheter was flushed with appropriate volume heparin
dwells.

The catheter exit site was secured with a 0-Prolene retention
suture. The venotomy incision was closed with Dermabond. Dressings
were applied. The patient tolerated the procedure well without
immediate post procedural complication.
IMPRESSION: Successful placement of 23 cm tip to cuff tunneled power PICC via
the left vein with tip terminating within the superior aspect of the
right atrium. The catheter is ready for immediate use.

## 2016-02-11 IMAGING — XA IR REMOVAL TUNNELED CV CATH
1 series · 2 of 2 positions shown · non-contrast
Comparison: none

CLINICAL DATA: Chronic renal failure. No longer requiring tunneled
HD catheter. Request removal

[Series 1: run · 2 of 2 slices shown]
[im 1/2]
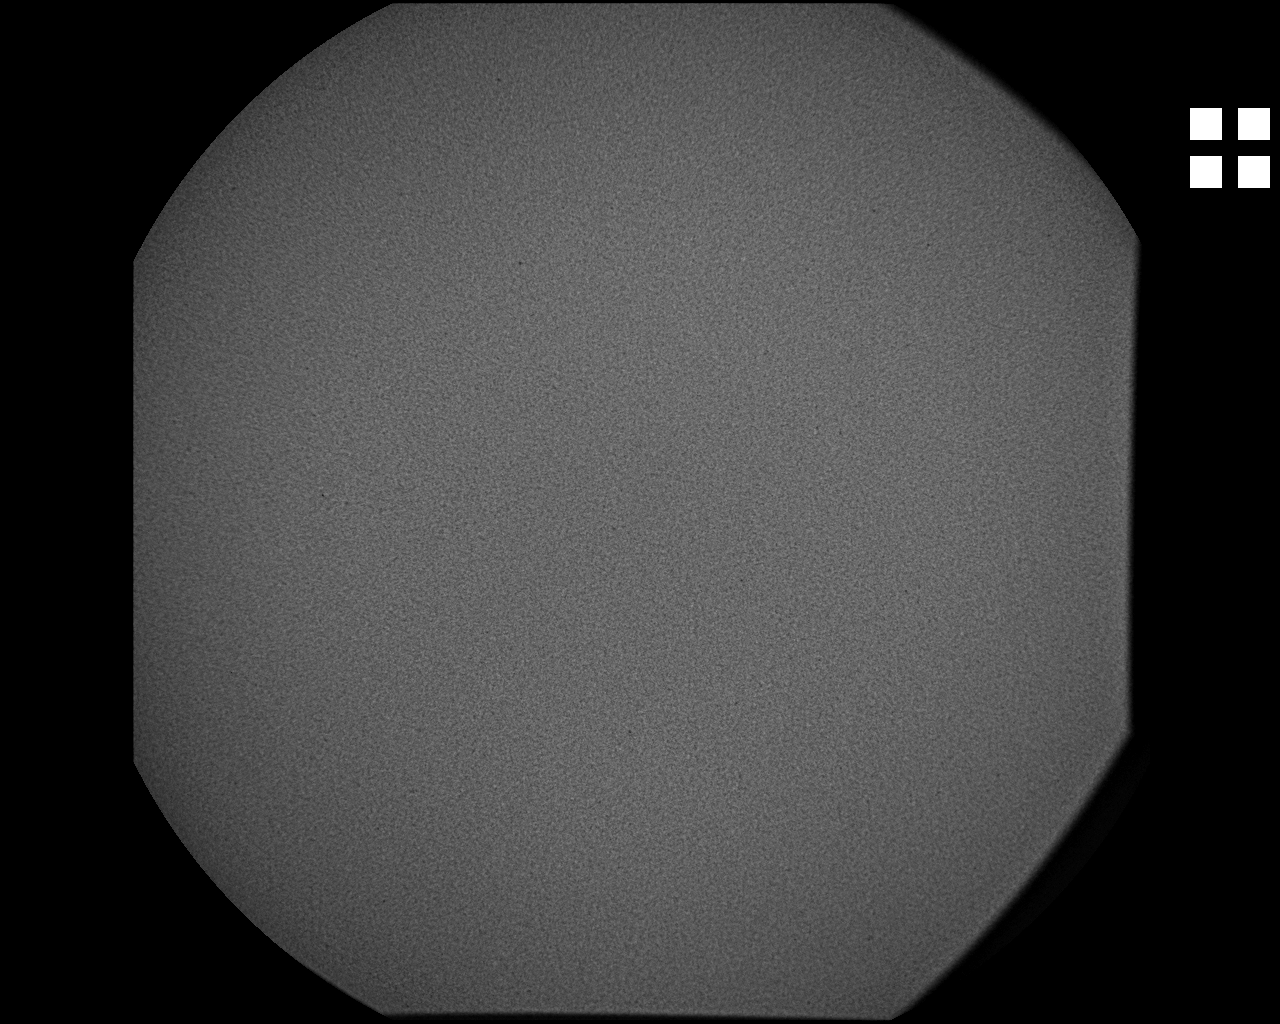
[im 2/2]
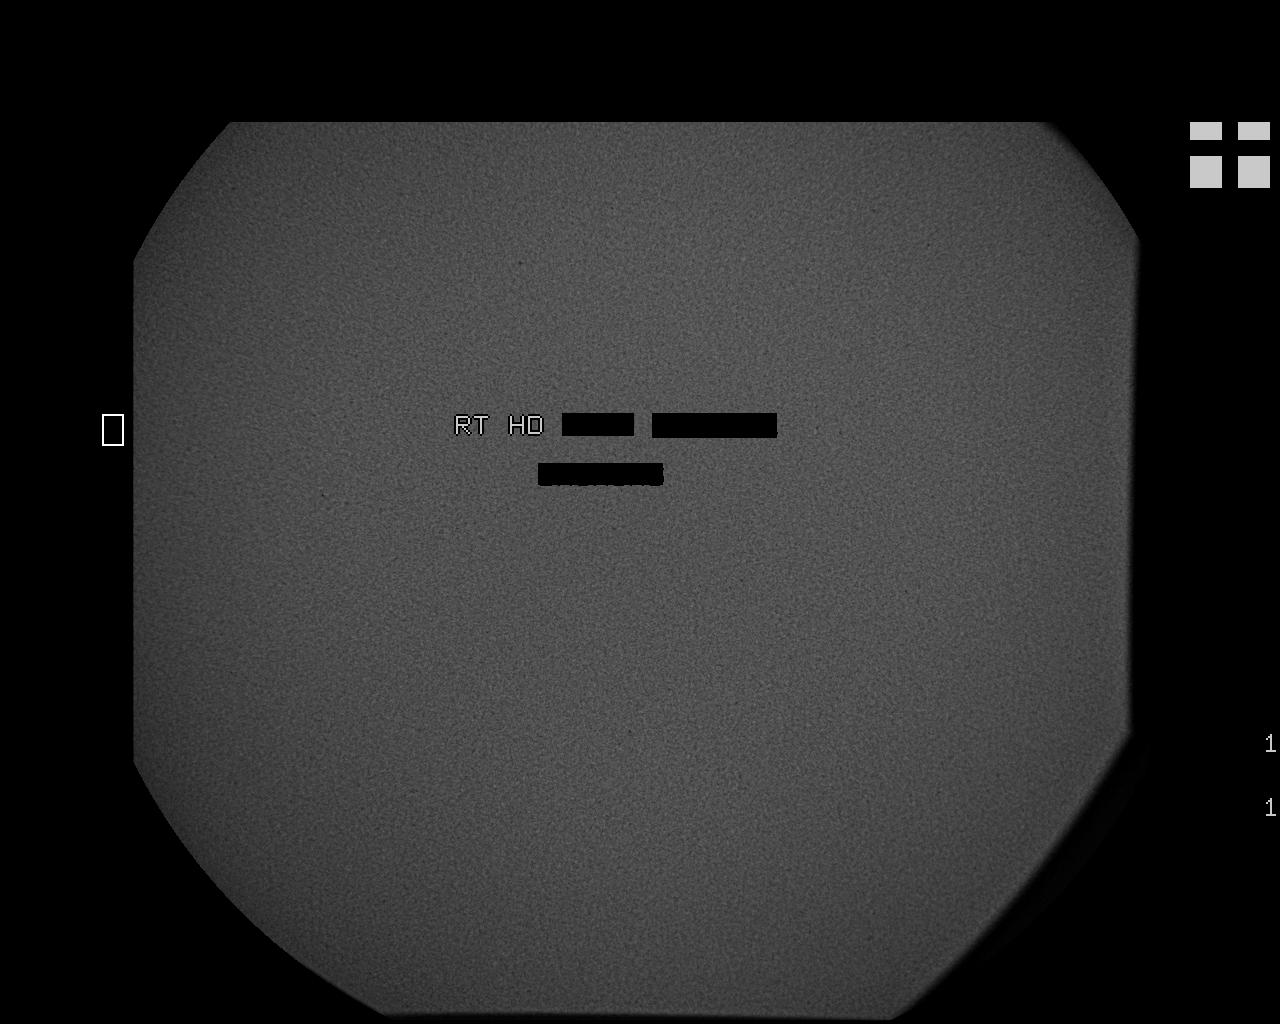

[2 of 2 positions shown; findings below may reference images not displayed]

EXAM:
REMOVAL TUNNELED CENTRAL VENOUS CATHETER

PROCEDURE:
The patient's right chest and catheter was prepped and draped in a
normal sterile fashion. Heparin was removed from both ports of
catheter. 1% lidocaine was used for local anesthesia. Using gentle
blunt dissection the cuff of the catheter was exposed and the
catheter was removed in it's entirety. Pressure was held till
hemostasis was obtained. A sterile dressing was applied. The patient
tolerated the procedure well with no immediate complications.

COMPLICATIONS:
None immediate.
IMPRESSION: Successful catheter removal as described above.

## 2016-02-19 IMAGING — CR DG CHEST 2V
2 series · 2 of 2 positions shown · non-contrast
Comparison: DG CHEST 2 VIEW dated 11/23/2013; DG CHEST 2 VIEW dated
11/07/2013

CLINICAL DATA: Hypertension.  Fatigue.

EXAM:
CHEST  2 VIEW

[w chest lat]
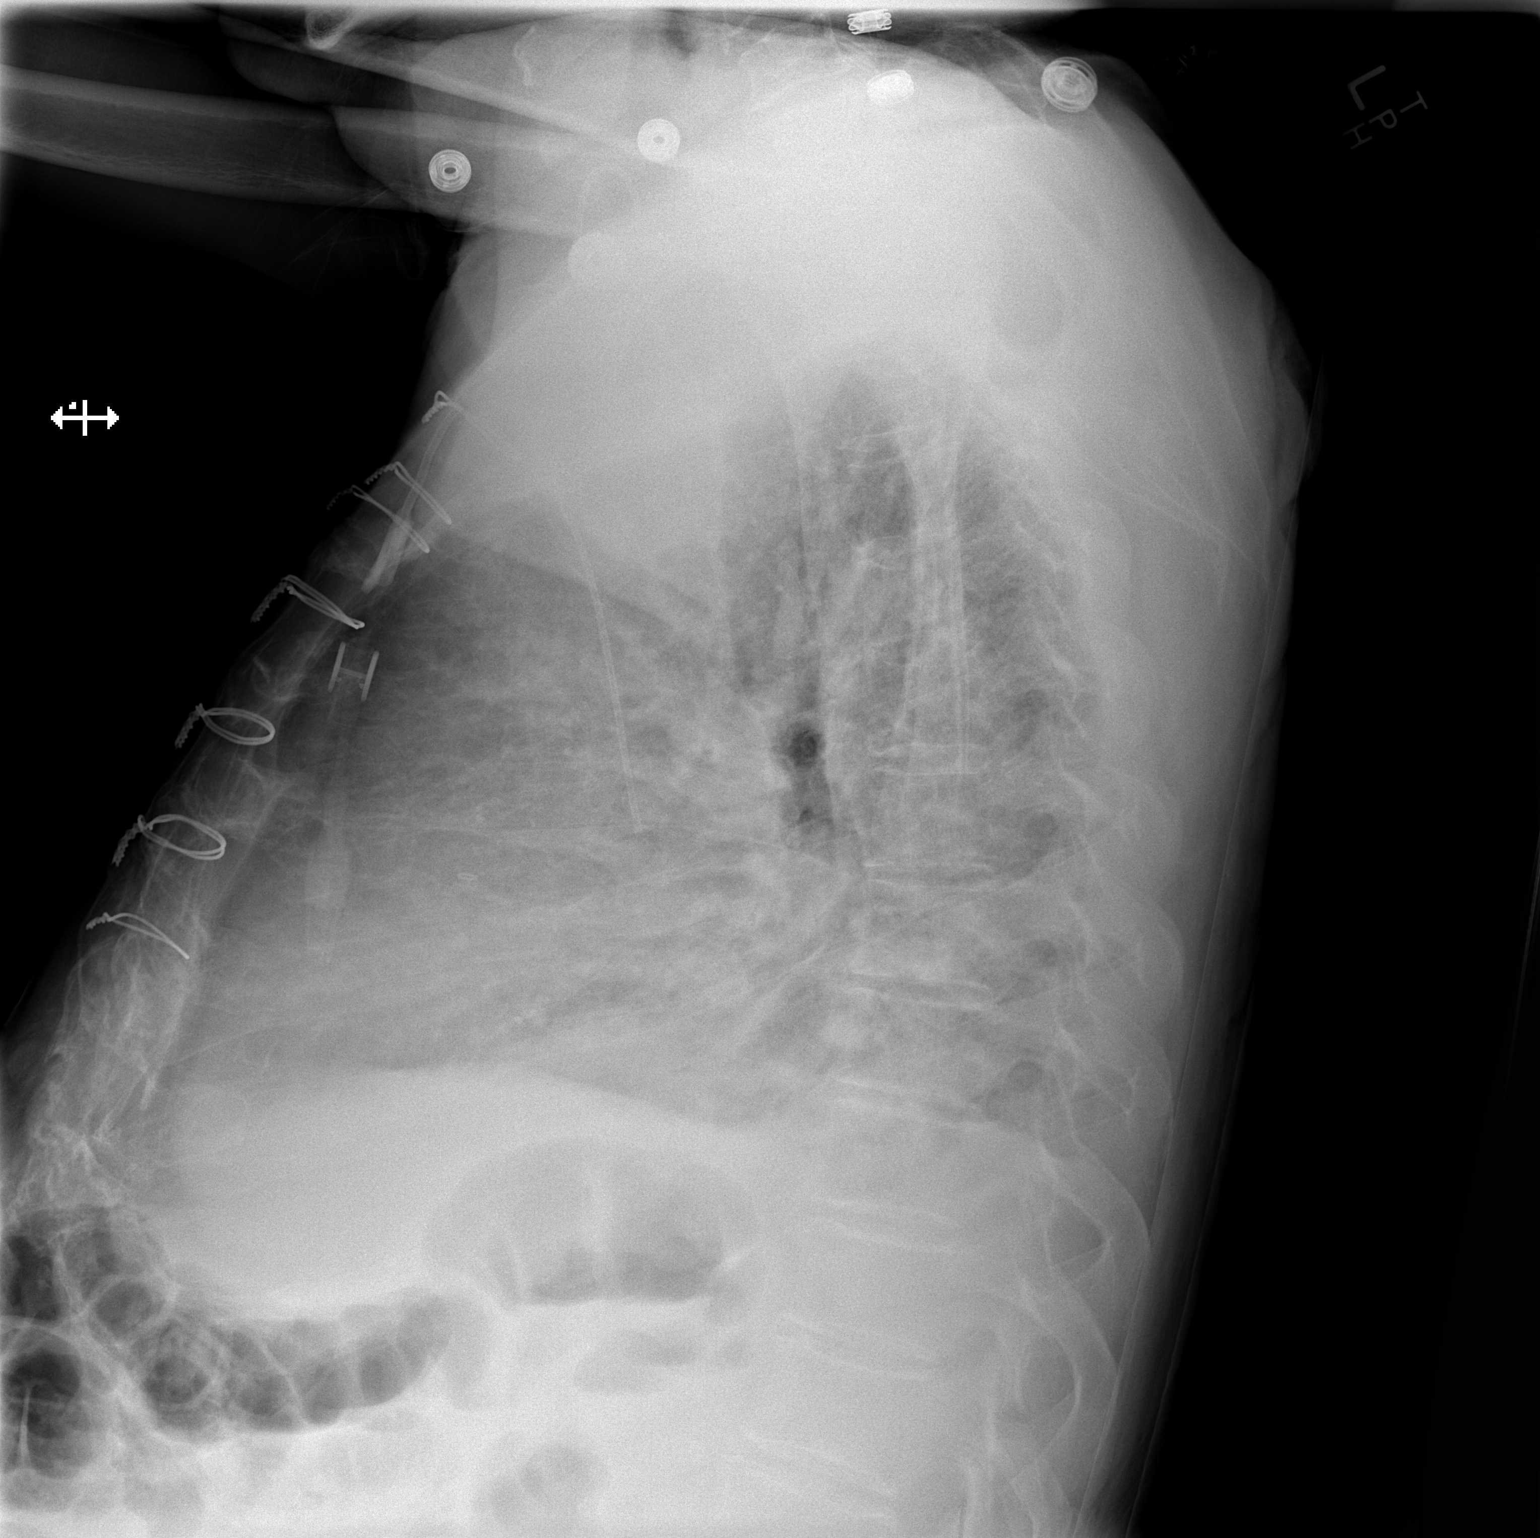

[x chest ap]
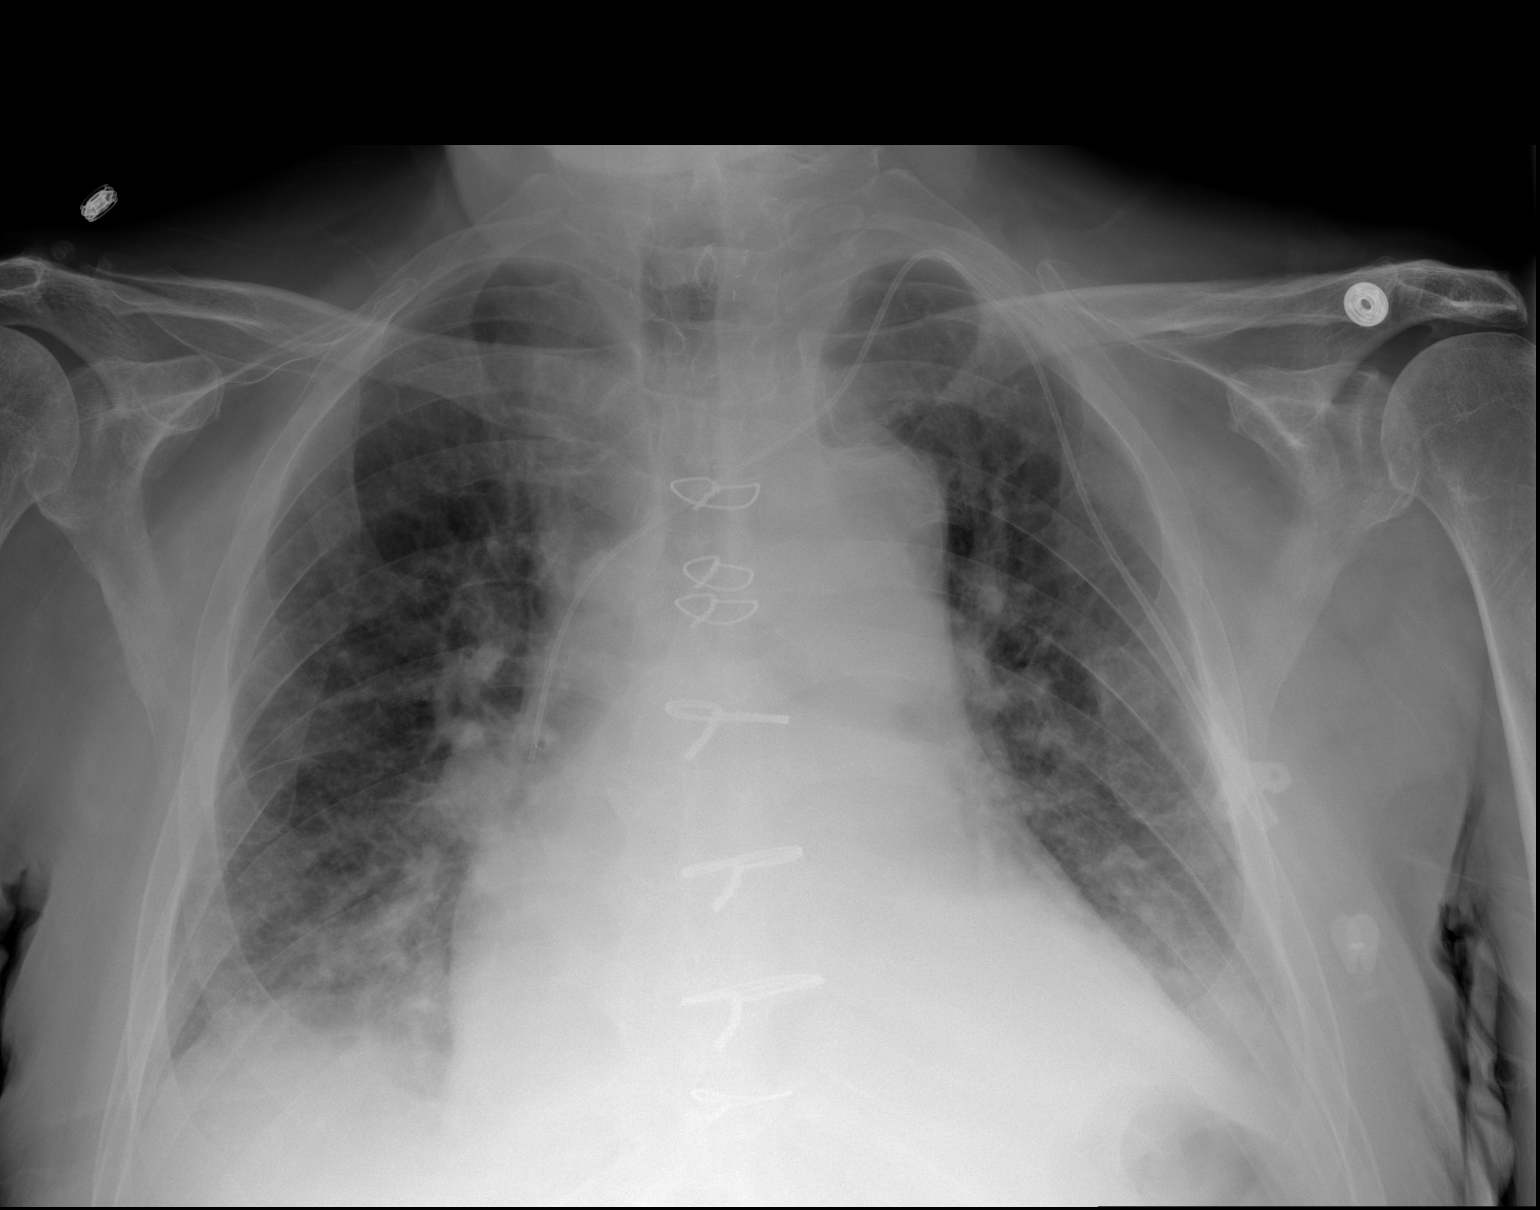

[2 of 2 positions shown; findings below may reference images not displayed]

FINDINGS: Moderate CHF is present. Left IJ central line is present with the
tip terminating in the mid SVC. There is bilateral basilar
atelectasis, airspace disease and interstitial pulmonary edema is
present diffusely.
IMPRESSION: Moderate CHF.

## 2016-03-06 ENCOUNTER — Ambulatory Visit (INDEPENDENT_AMBULATORY_CARE_PROVIDER_SITE_OTHER): Payer: Commercial Managed Care - HMO | Admitting: Family Medicine

## 2016-03-06 ENCOUNTER — Encounter: Payer: Self-pay | Admitting: Family Medicine

## 2016-03-06 VITALS — BP 90/70 | HR 78 | Temp 98.4°F | Ht 72.0 in | Wt 201.0 lb

## 2016-03-06 DIAGNOSIS — Z0000 Encounter for general adult medical examination without abnormal findings: Secondary | ICD-10-CM | POA: Diagnosis not present

## 2016-03-06 DIAGNOSIS — Z23 Encounter for immunization: Secondary | ICD-10-CM | POA: Diagnosis not present

## 2016-03-06 LAB — LIPID PANEL
Cholesterol: 182 mg/dL (ref 0–200)
HDL: 42.4 mg/dL (ref >39.00)
LDL Cholesterol: 116 mg/dL — ABNORMAL HIGH (ref 0–99)
NonHDL: 139.22
Total CHOL/HDL Ratio: 4
Triglycerides: 116 mg/dL (ref 0.0–149.0)
VLDL: 23.2 mg/dL (ref 0.0–40.0)

## 2016-03-06 LAB — BASIC METABOLIC PANEL
BUN: 19 mg/dL (ref 6–23)
CO2: 28 mEq/L (ref 19–32)
Calcium: 9.9 mg/dL (ref 8.4–10.5)
Chloride: 105 mEq/L (ref 96–112)
Creatinine, Ser: 1.4 mg/dL (ref 0.40–1.50)
GFR: 55.12 mL/min — ABNORMAL LOW (ref >60.00)
Glucose, Bld: 61 mg/dL — ABNORMAL LOW (ref 70–99)
Potassium: 5.4 mEq/L — ABNORMAL HIGH (ref 3.5–5.1)
Sodium: 140 mEq/L (ref 135–145)

## 2016-03-06 LAB — CBC WITH DIFFERENTIAL/PLATELET
Basophils Absolute: 0.1 10*3/uL (ref 0.0–0.1)
Basophils Relative: 1 % (ref 0.0–3.0)
Eosinophils Absolute: 0.3 10*3/uL (ref 0.0–0.7)
Eosinophils Relative: 4.2 % (ref 0.0–5.0)
HCT: 45.8 % (ref 39.0–52.0)
Hemoglobin: 15.6 g/dL (ref 13.0–17.0)
Lymphocytes Relative: 19 % (ref 12.0–46.0)
Lymphs Abs: 1.4 10*3/uL (ref 0.7–4.0)
MCHC: 34.2 g/dL (ref 30.0–36.0)
MCV: 90.9 fl (ref 78.0–100.0)
Monocytes Absolute: 1 10*3/uL (ref 0.1–1.0)
Monocytes Relative: 13.6 % — ABNORMAL HIGH (ref 3.0–12.0)
Neutro Abs: 4.4 10*3/uL (ref 1.4–7.7)
Neutrophils Relative %: 62.2 % (ref 43.0–77.0)
Platelets: 163 10*3/uL (ref 150.0–400.0)
RBC: 5.03 Mil/uL (ref 4.22–5.81)
RDW: 14.3 % (ref 11.5–15.5)
WBC: 7.1 10*3/uL (ref 4.0–10.5)

## 2016-03-06 LAB — HEPATIC FUNCTION PANEL
ALT: 18 U/L (ref 0–53)
AST: 20 U/L (ref 0–37)
Albumin: 4.3 g/dL (ref 3.5–5.2)
Alkaline Phosphatase: 48 U/L (ref 39–117)
Bilirubin, Direct: 0.1 mg/dL (ref 0.0–0.3)
Total Bilirubin: 0.6 mg/dL (ref 0.2–1.2)
Total Protein: 6.7 g/dL (ref 6.0–8.3)

## 2016-03-06 LAB — TSH: TSH: 8.67 u[IU]/mL — ABNORMAL HIGH (ref 0.35–4.50)

## 2016-03-06 LAB — PSA: PSA: 1.87 ng/mL (ref 0.10–4.00)

## 2016-03-06 NOTE — Progress Notes (Signed)
Subjective:     Patient ID: Bradley [name], male   DOB: 06/01/1956, 60 y.o.   MRN: [id number]  HPI Patient seen for physical exam. Chronic problems include history of combined systolic and diastolic heart failure, hypertension, hypothyroidism, chronic kidney disease, chronic anxiety, recurrent depression, history of CAD.  He is followed closely by cardiology including congestive heart failure clinic  Last tetanus was over 10 years ago. He denies having prior screening colonoscopy. No history of known hepatitis C screening. Nonsmoker. Has been disabled regarding his heart situation for several years.  Past Medical History:  Diagnosis Date  . Anxiety   . Aortic stenosis    s/p Bentall with bioprosthetic AVR 02/2010; Last echo (9/11): Moderate LVH, EF 45-50%, AVR functioning appropriately, aortic valve mean gradient 21, diastolic dysfunction. Chest MRA (2/13): Mild to moderate dilatation at the sinus of Valsalva at 4.1 cm, mild dilatation ascending aorta distal to the tube graft at 3.9 cm, moderate dilatation of the innominate artery a 2.1 cm;    . Depression   . ESRD on dialysis [facility])    a. 09/2013 felt to be related to gentamycin b. no longer requiring HD  . Hx of cardiac cath    a. LHC in 02/2010: normal cors  . Hx of echocardiogram 02/2014   Echo (8/15):  Mod LV, EF 35-40%, Gr 1 DD, AVR ok (mean 14 mmHg), mild LAE  . Hypertension   . Hypothyroidism, postsurgical   . Prosthetic valve endocarditis ([place])   . Staphylococcus aureus bacteremia   . Thyroid cancer ([place])   . Ventricular tachycardia ([place])    a. s/p STJ ICD   Past Surgical History:  Procedure Laterality Date  . AORTIC VALVE REPAIR  1968  . AORTIC VALVE REPLACEMENT  2011  . AV FISTULA PLACEMENT Left 10/08/2013   Procedure: ARTERIOVENOUS (AV) FISTULA CREATION- LEFT ARM; Radial Cephalic ;  Surgeon: Mal [name], MD;  Location: [place];  Service: Vascular;  Laterality: Left;  . BENTALL PROCEDURE N/A 10/28/2013   Procedure: REDO  BENTALL PROCEDURE, debridment of aoritc root abscess, replacement of aortic root, ascending aorta and aortic valve with homograft. Insertion of left femoral arterial line;  Surgeon: [name], MD;  Location: [place];  Service: Open Heart Surgery;  Laterality: N/A;  . CARDIAC CATHETERIZATION  03/02/2010   NORMAL CORONARY ARTERY  . CARDIAC CATHETERIZATION N/A 01/01/2015   Procedure: Coronary/Graft Angiography;  Surgeon: Troy Sine, MD;  Location: [place] CV LAB;  Service: Cardiovascular;  Laterality: N/A;  . CARDIAC VALVE REPLACEMENT    . EP IMPLANTABLE DEVICE N/A 01/01/2015   Procedure: ICD Implant;  Surgeon: Deboraha Sprang, MD;  Location: [place] CV LAB;  Service: Cardiovascular;  Laterality: N/A;  . INTRAOPERATIVE TRANSESOPHAGEAL ECHOCARDIOGRAM N/A 10/28/2013   Procedure: INTRAOPERATIVE TRANSESOPHAGEAL ECHOCARDIOGRAM;  Surgeon: [name], MD;  Location: [place];  Service: Open Heart Surgery;  Laterality: N/A;  . PERIPHERAL VASCULAR CATHETERIZATION N/A 01/01/2015   Procedure: Aortic Arch Angiography;  Surgeon: Troy Sine, MD;  Location: [place] CV LAB;  Service: Cardiovascular;  Laterality: N/A;  . SHOULDER ARTHROSCOPY W/ ROTATOR CUFF REPAIR Right 2012  . STERNOTOMY     REDO  . TEE WITHOUT CARDIOVERSION N/A 10/24/2013   Procedure: TRANSESOPHAGEAL ECHOCARDIOGRAM (TEE);  Surgeon: Dorothy Spark, MD;  Location: [facility] ENDOSCOPY;  Service: Cardiovascular;  Laterality: N/A;  . THYROIDECTOMY  ~ 2005  . TRANSTHORACIC ECHOCARDIOGRAM  03/2010   SHOWED MILD REDUCTION OF LV FUNCTION  reports that he has never smoked. He has never used smokeless tobacco. He reports that he does not drink alcohol or use drugs. family history includes Heart disease in his father; Hypertension in his father and mother; Kidney Stones in his sister; Stroke in his father; Thyroid cancer in his sister and sister. Allergies  Allergen Reactions  . Oxycodone Other (See Comments)    Gives patient nightmares   . Rifampin Nausea Only      Review of Systems  Constitutional: Positive for fatigue. Negative for activity change, appetite change and fever.  HENT: Negative for congestion, ear pain and trouble swallowing.   Eyes: Negative for pain and visual disturbance.  Respiratory: Negative for cough and wheezing.   Cardiovascular: Negative for chest pain and palpitations.  Gastrointestinal: Positive for constipation. Negative for abdominal distention, abdominal pain, blood in stool, diarrhea, nausea, rectal pain and vomiting.  Endocrine: Negative for polydipsia and polyuria.  Genitourinary: Negative for dysuria, hematuria and testicular pain.  Musculoskeletal: Negative for arthralgias and joint swelling.  Skin: Negative for rash.  Neurological: Negative for dizziness, syncope and headaches.  Hematological: Negative for adenopathy.  Psychiatric/Behavioral: Negative for confusion and dysphoric mood.       Objective:   Physical Exam  Constitutional: He is oriented to person, place, and time. He appears well-developed and well-nourished.  HENT:  Right Ear: External ear normal.  Left Ear: External ear normal.  Mouth/Throat: Oropharynx is clear and moist.  Eyes: Pupils are equal, round, and reactive to light.  Neck: Neck supple. No thyromegaly present.  Cardiovascular: Normal rate and regular rhythm.   Murmur heard. Pulmonary/Chest: Effort normal and breath sounds normal. No respiratory distress. He has no wheezes. He has no rales.  Abdominal: Soft. Bowel sounds are normal. He exhibits no distension and no mass. There is no tenderness. There is no rebound and no guarding.  Musculoskeletal: He exhibits no edema.  Lymphadenopathy:    He has no cervical adenopathy.  Neurological: He is alert and oriented to person, place, and time. No cranial nerve deficit.  Skin: No rash noted.  Psychiatric: He has a normal mood and affect. His behavior is normal.       Assessment:     Physical exam.  Patient needs tetanus booster. Has had previous pneumococcal vaccine. No history of colon cancer screening    Plan:     -Obtained screening lab work including hepatitis C antibody -Tetanus booster given -Long discussion regarding pros and cons of colon cancer screening. Patient expresses interest in DNA based stool testing with Cologuard -The natural history of prostate cancer and ongoing controversy regarding screening and potential treatment outcomes of prostate cancer has been discussed with the patient. The meaning of a false positive PSA and a false negative PSA has been discussed. He indicates understanding of the limitations of this screening test and wishes to proceed with screening PSA testing. -Recommend yearly flu vaccination  [name] MD [facility]

## 2016-03-06 NOTE — Patient Instructions (Signed)
Recommend yearly flu vaccine We will order Cologuard test as discussed We will call you regarding lab work today. Tetanus shot coverage is good for 10 years.

## 2016-03-06 NOTE — Progress Notes (Signed)
Pre visit review using our clinic review tool, if applicable. No additional management support is needed unless otherwise documented below in the visit note. 

## 2016-03-07 ENCOUNTER — Encounter: Payer: Self-pay | Admitting: Family Medicine

## 2016-03-07 LAB — HEPATITIS C ANTIBODY: HCV Ab: NEGATIVE

## 2016-03-10 ENCOUNTER — Other Ambulatory Visit: Payer: Self-pay

## 2016-03-10 DIAGNOSIS — E038 Other specified hypothyroidism: Secondary | ICD-10-CM

## 2016-03-10 MED ORDER — LEVOTHYROXINE SODIUM 150 MCG PO TABS: ORAL_TABLET | ORAL | 2 refills | Status: DC

## 2016-03-20 ENCOUNTER — Other Ambulatory Visit: Payer: Self-pay | Admitting: Family Medicine

## 2016-03-20 DIAGNOSIS — Z1212 Encounter for screening for malignant neoplasm of rectum: Secondary | ICD-10-CM | POA: Diagnosis not present

## 2016-03-20 DIAGNOSIS — Z1211 Encounter for screening for malignant neoplasm of colon: Secondary | ICD-10-CM | POA: Diagnosis not present

## 2016-03-20 LAB — COLOGUARD
Cologuard: NEGATIVE
Cologuard: NEGATIVE

## 2016-04-05 ENCOUNTER — Encounter: Payer: Self-pay | Admitting: Family Medicine

## 2016-04-06 ENCOUNTER — Encounter: Payer: Self-pay | Admitting: Family Medicine

## 2016-04-18 ENCOUNTER — Other Ambulatory Visit: Payer: Self-pay | Admitting: Family Medicine

## 2016-04-24 ENCOUNTER — Ambulatory Visit (HOSPITAL_COMMUNITY)
Admission: RE | Admit: 2016-04-24 | Discharge: 2016-04-24 | Disposition: A | Discharge status: Home / Self Care | Payer: Commercial Managed Care - HMO | Source: Ambulatory Visit | Attending: Internal Medicine | Admitting: Internal Medicine

## 2016-04-24 VITALS — BP 90/60 | HR 66 | Wt 201.8 lb

## 2016-04-24 DIAGNOSIS — F329 Major depressive disorder, single episode, unspecified: Secondary | ICD-10-CM | POA: Insufficient documentation

## 2016-04-24 DIAGNOSIS — I5042 Chronic combined systolic (congestive) and diastolic (congestive) heart failure: Secondary | ICD-10-CM | POA: Diagnosis not present

## 2016-04-24 DIAGNOSIS — Z7982 Long term (current) use of aspirin: Secondary | ICD-10-CM | POA: Insufficient documentation

## 2016-04-24 DIAGNOSIS — I132 Hypertensive heart and chronic kidney disease with heart failure and with stage 5 chronic kidney disease, or end stage renal disease: Secondary | ICD-10-CM | POA: Insufficient documentation

## 2016-04-24 DIAGNOSIS — Z954 Presence of other heart-valve replacement: Secondary | ICD-10-CM

## 2016-04-24 DIAGNOSIS — I428 Other cardiomyopathies: Secondary | ICD-10-CM | POA: Insufficient documentation

## 2016-04-24 DIAGNOSIS — I472 Ventricular tachycardia, unspecified: Secondary | ICD-10-CM

## 2016-04-24 DIAGNOSIS — Z952 Presence of prosthetic heart valve: Secondary | ICD-10-CM

## 2016-04-24 DIAGNOSIS — I38 Endocarditis, valve unspecified: Secondary | ICD-10-CM | POA: Diagnosis not present

## 2016-04-24 DIAGNOSIS — I5022 Chronic systolic (congestive) heart failure: Secondary | ICD-10-CM | POA: Insufficient documentation

## 2016-04-24 DIAGNOSIS — Z79899 Other long term (current) drug therapy: Secondary | ICD-10-CM | POA: Insufficient documentation

## 2016-04-24 LAB — BASIC METABOLIC PANEL
Anion gap: 7 (ref 5–15)
BUN: 16 mg/dL (ref 6–20)
CO2: 23 mmol/L (ref 22–32)
Calcium: 8.9 mg/dL (ref 8.9–10.3)
Chloride: 109 mmol/L (ref 101–111)
Creatinine, Ser: 1.29 mg/dL — ABNORMAL HIGH (ref 0.61–1.24)
GFR calc Af Amer: >60 mL/min (ref >60)
GFR calc non Af Amer: 59 mL/min — ABNORMAL LOW (ref >60)
Glucose, Bld: 76 mg/dL (ref 65–99)
Potassium: 5.8 mmol/L — ABNORMAL HIGH (ref 3.5–5.1)
Sodium: 139 mmol/L (ref 135–145)

## 2016-04-24 NOTE — Patient Instructions (Signed)
Labs today  Your physician recommends that you schedule a follow-up appointment in: 3 months  

## 2016-04-24 NOTE — Addendum Note (Signed)
Encounter addended by: Scarlette Calico, RN on: 04/24/2016 12:25 PM<BR>    Actions taken: Diagnosis association updated, Order Entry activity accessed, Sign clinical note

## 2016-04-24 NOTE — Progress Notes (Signed)
Advanced Heart Failure Medication Review by a Pharmacist  Does the patient  feel that his/her medications are working for him/her?  yes  Has the patient been experiencing any side effects to the medications prescribed?  yes  Does the patient measure his/her own blood pressure or blood glucose at home?  yes   Does the patient have any problems obtaining medications due to transportation or finances?   no  Understanding of regimen: good Understanding of indications: good Potential of compliance: good Patient understands to avoid NSAIDs. Patient understands to avoid decongestants.  Issues to address at subsequent visits: None   Pharmacist comments:  Mr. Stockhausen is a pleasant 60 yo M presenting with his wife who manages his medications. They report good compliance with his medications but are only taking atorvastatin QOD because he "takes too many medications". I have recommended taking this daily as long as he is able to tolerate it. He has also only been taking 6.25 mg BID of carvedilol instead of 6.25/12.5 mg because his blood pressure has been low recently. He did not have any other medication-related questions or concerns for me at this time.   Ruta Hinds. Velva Harman, PharmD, BCPS, CPP Clinical Pharmacist Pager: [phone] Phone: [phone] 04/24/2016 12:08 PM      Time with patient: 10 minutes Preparation and documentation time: 2 minutes Total time: 12 minutes

## 2016-04-24 NOTE — Progress Notes (Signed)
Patient ID: Bradley [name], male   DOB: 02-25-1956, 60 y.o.   MRN: [id number]    Advanced Heart Failure Clinic Note   Referring Physician: Chanetta Marshall, NP Primary Care: Dr [name] Primary Cardiologist: Dr Dorris Carnes Primary HF: New (Dr. Haroldine Laws)  HPI:  Bradley [name] is a 60 y.o. male with a complicated past medical history including thyroid cancer (s/p thyroidectomy), hypertension, depression, and congenital aortic stenosis for which he underwent aortic vavulotomy at age 7 with subsequent Bentall procedure in 2001 with a pericardial tissue valve. He had MSSA bacteremia and bacterial endocarditis in 02/2992 complicated by renal failure requiring dialysis for a short period of time. 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   He was admitted 11/2014 with progressive episodes of palpitations and pre-syncope. He was found to have monomorphic ventricular tachycardia. Catheterization demonstrated normal coronaries. Echocardiogram demonstrated EF 30-35% and he underwent STJ dual chamber ICD implant. Mexiletine was added prior to discharge and his Metoprolol was changed to Coreg. He had subsequent ventricular arrhythmias and his Mexelitine was increased with no further VT.  CPX test performed 10/25/15 showed a moderate functional impairment (pVO2 16.1). Resting spirometry reveals mild obstruction/restriction. There was also evidence of chronotropic incompetence.   He presents for follow-up. Overall has been feeling good. Tolerating Entresto. Mood is much better. 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 Gets fatigued if he tries to do too much. Felt he was volume overloaded last month but now better.  Went to [employer] but got moody and cancelled. Now wants to go back.  Has lost 5 pounds.  No edema, orthopnea or PND. BP drops ina m to 90s if  he doesn't eat first.   Corevue interrogated personally: Volume looks good. Was volume overloaded in 8/17 but now improved. 6 episodes of NSVT since June. 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 Lives at home with wife and 3/4 of his kids. 2 sons and 2 daughters.    CPX 10/25/15 FVC 3.47 (66%) FEV1 2.86 (71%) Resting HR 60 Peak 112 Resting BP 130/80 Peak 190/88 Peak V02 16.1 (55.2%) ZJ/IR67 slope: 35 OUES: 1.69 Peak RER 1.13 Ventilatory threshold 10.9 (37.4%) Peak RR 40 Peak Ventilation 72.2 VE/MVV 51% PETCO2 at peak: 31 O2 pulse 13 (81%)   Echo 04/2013 LVEF 60-65% Echo 08/2013 LVEF 45-50% Echo 11/2013 LVEF 20-25% Echo 02/2014 LVEF 35-40% Echo 12/30/14 LVEF 30-35%, Mild MR, Mod LAE, RV mildly dilated, Mild RAE Echo 5/17 25-30%     Past Medical History:  Diagnosis Date  . Anxiety   . Aortic stenosis    s/p Bentall with bioprosthetic AVR 02/2010; Last echo (9/11): Moderate LVH, EF 45-50%, AVR functioning appropriately, aortic valve mean gradient 21, diastolic dysfunction. Chest MRA (2/13): Mild to moderate dilatation at the sinus of Valsalva at 4.1 cm, mild dilatation ascending aorta distal to the tube graft at 3.9 cm, moderate dilatation of the innominate artery a 2.1 cm;    . Depression   . ESRD on dialysis [facility])    a. 09/2013 felt to be related to gentamycin b. no longer requiring HD  . Hx of cardiac cath    a. LHC in 02/2010: normal cors  . Hx of echocardiogram 02/2014   Echo (8/15):  Mod LV, EF 35-40%, Gr 1 DD, AVR ok (mean 14 mmHg), mild LAE  . Hypertension   .  Hypothyroidism, postsurgical   . Prosthetic valve endocarditis ([place])   . Staphylococcus aureus bacteremia   . Thyroid cancer ([place])   . Ventricular tachycardia ([place])    a. s/p STJ ICD    Current Outpatient Prescriptions  Medication Sig Dispense Refill  . aspirin EC 81 MG tablet Take 81 mg by mouth daily.     Marland Kitchen atorvastatin (LIPITOR) 40 MG tablet TAKE ONE TABLET BY MOUTH ONCE DAILY AT   6PM 90 tablet 0  . betamethasone valerate ointment (VALISONE) 0.1 % APPLY  OINTMENT TO AFFECTED AREA TWICE DAILY 30 g 2  . carvedilol (COREG) 6.25 MG tablet TAKE 1 TABLET IN THE MORNING AND 2 TABLETS AT NIGHT BEFORE BEDTIME 90 tablet 5  . clonazePAM (KLONOPIN) 1 MG tablet TAKE ONE TABLET BY MOUTH TWICE DAILY AS NEEDED(NOT FOR REGULAR USE). 30 tablet 0  . docusate sodium (STOOL SOFTENER) 100 MG capsule Take 100 mg by mouth 2 (two) times daily.    . furosemide (LASIX) 20 MG tablet Take 1 tablet every Monday and Thursday 30 tablet 11  . levothyroxine (SYNTHROID, LEVOTHROID) 137 MCG tablet TAKE ONE TABLET BY MOUTH ONCE DAILY BEFORE  BREAKFAST. 90 tablet 1  . levothyroxine (SYNTHROID, LEVOTHROID) 150 MCG tablet TAKE ONE TABLET BY MOUTH ONCE DAILY BEFORE BREAKFAST 90 tablet 2  . mexiletine (MEXITIL) 150 MG capsule TAKE 2 CAPSULES BY MOUTH EVERY MORNING AND TAKE 2 CAPSULES EVERY EVENING 360 capsule 3  . Multiple Vitamin (MULTIVITAMIN WITH MINERALS) TABS tablet Take 1 tablet by mouth every other day. Centrum Silver    . Neomycin-Bacitracin-Polymyxin (HCA TRIPLE ANTIBIOTIC OINTMENT EX) Apply 1 application topically 2 (two) times daily.     Marland Kitchen RANEXA 500 MG 12 hr tablet TAKE 1 TABLET BY MOUTH TWICE A DAY 60 tablet 5  . sacubitril-valsartan (ENTRESTO) 49-51 MG Take 1 tablet by mouth 2 (two) times daily. 60 tablet 6  . sertraline (ZOLOFT) 50 MG tablet TAKE ONE TABLET BY MOUTH ONCE DAILY. 30 tablet 5   No current facility-administered medications for this encounter.     Allergies  Allergen Reactions  . Oxycodone Other (See Comments)    Gives patient nightmares  . Rifampin Nausea Only      Social History   Social History  . Marital status: Married    Spouse name: N/A  . Number of children: N/A  . Years of education: N/A   Occupational History  . Not on file.   Social History Main Topics  . Smoking status: Never Smoker  . Smokeless tobacco: Never Used  . Alcohol use No     Comment:  Occasional  . Drug use: No  . Sexual activity: No   Other Topics Concern  . Not on file   Social History Narrative   Lives at home with walker.  Does not drive.  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 Hypertension Mother   . Heart disease Father   . Stroke Father   . Hypertension Father   . Thyroid cancer Sister   . Thyroid cancer Sister   . Kidney Stones Sister   . Heart attack Neg Hx     Vitals:   04/24/16 1145  BP: 90/60  Pulse: 66  SpO2: 98%  Weight: 201 lb 12.8 oz (91.5 kg)   Filed Weights   04/24/16 1145  Weight: 201 lb 12.8 oz (91.5 kg)    PHYSICAL EXAM:  General:  Well appearing.  HEENT: normal Neck: supple. JVP 5-6. Carotids 2+ bilat; no bruits. No thyromegaly or nodule noted. Cor: PMI nondisplaced. Regular rate & rhythm. 2/6 SEM HSB Lungs: CTAB, normal effort Abdomen: soft, NT, ND, no HSM. No bruits or masses. +BS  Extremities: no cyanosis, clubbing, rash, edema Neuro: alert & oriented x 3, cranial nerves grossly intact. moves all 4 extremities w/o difficulty. Affect pleasant.   ASSESSMENT & PLAN:  1. Chronic systolic HF - Echo 1/61 LVEF 25-30%, Mild MR, Mod LAE, RV mildly dilated, Mild RAE.  2/2 NICM - Improved. NYHA class II-early III. CPX with moderate HF limitation. - Volume status looks good on exam and by Corevue  - Continue lasix at current dose - s/p SJ ICD - Continue Entresto, carvedilol. BP too low to titrate further.  - Check BMET - If becomes dizzy will need to cut Entresto. Will continue for now. He will keep in touch with [place].  2. VT/NICM - On mexilitene/Ranexa 3. HTN 4. Valvular heart disease s/p bentall in 2001 and re-do bentall 2015 5. Depression   Sherlock Nancarrow,MD 9:05 AM

## 2016-05-02 ENCOUNTER — Encounter: Payer: Self-pay | Admitting: Internal Medicine

## 2016-05-04 ENCOUNTER — Encounter: Payer: Self-pay | Admitting: Internal Medicine

## 2016-05-10 ENCOUNTER — Encounter: Payer: Self-pay | Admitting: Internal Medicine

## 2016-05-10 ENCOUNTER — Ambulatory Visit (INDEPENDENT_AMBULATORY_CARE_PROVIDER_SITE_OTHER): Payer: Commercial Managed Care - HMO | Admitting: Internal Medicine

## 2016-05-10 VITALS — BP 120/86 | HR 62 | Ht 73.0 in | Wt 197.0 lb

## 2016-05-10 DIAGNOSIS — I472 Ventricular tachycardia, unspecified: Secondary | ICD-10-CM

## 2016-05-10 DIAGNOSIS — I428 Other cardiomyopathies: Secondary | ICD-10-CM

## 2016-05-10 DIAGNOSIS — Z95810 Presence of automatic (implantable) cardiac defibrillator: Secondary | ICD-10-CM

## 2016-05-10 DIAGNOSIS — I5022 Chronic systolic (congestive) heart failure: Secondary | ICD-10-CM | POA: Diagnosis not present

## 2016-05-10 LAB — CUP PACEART INCLINIC DEVICE CHECK
Battery Remaining Longevity: 82.8
Brady Statistic RV Percent Paced: 0 %
Date Time Interrogation Session: 20171011144756
HighPow Impedance: 73.125
Implantable Lead Implant Date: 20160603
Implantable Lead Location: 753860
Implantable Lead Model: 7122
Lead Channel Impedance Value: 412.5 Ohm
Lead Channel Pacing Threshold Amplitude: 1 V
Lead Channel Pacing Threshold Pulse Width: 0.5 ms
Lead Channel Sensing Intrinsic Amplitude: 11.7 mV
Lead Channel Setting Pacing Amplitude: 2.5 V
Lead Channel Setting Pacing Pulse Width: 0.5 ms
Lead Channel Setting Sensing Sensitivity: 0.5 mV
Pulse Gen Serial Number: 7135169

## 2016-05-10 NOTE — Progress Notes (Signed)
Patient Care Team: [name], MD as PCP - General [name], MD as Consulting Physician (Cardiothoracic Surgery) Mauricia Area, MD as Consulting Physician (Nephrology)   HPI  Bradley [name] is a 60 y.o. male Seen in follow-up for sustained ventricular tachycardia occurring with a frequency that required adjunctive medical therapy. He ended up on amiodarone.    He was admitted t2/15 with MSSA bacteremia and endocarditis. He had that up with a Bentall procedure debridement of his wound abscess. This was initially done in 2011 and required repeat 3/15.   It had a balloon valvotomy at age 83 for presumed congenital aortic stenosis  The patient denies chest pain, shortness of breath, nocturnal dyspnea, orthopnea or peripheral edema.  There have been no palpitations, lightheadedness or syncope.   .  STJ dual chamber ICD implanted 2016 for secondary prevention (VT in the setting of non-ischemic cardiomyopathy) History of appropriate therapy: yes - ATP History of AAD therapy: yes - Mexiletine   Hospital records reveiwed     Past Medical History:  Diagnosis Date  . Anxiety   . Aortic stenosis    s/p Bentall with bioprosthetic AVR 02/2010; Last echo (9/11): Moderate LVH, EF 45-50%, AVR functioning appropriately, aortic valve mean gradient 21, diastolic dysfunction. Chest MRA (2/13): Mild to moderate dilatation at the sinus of Valsalva at 4.1 cm, mild dilatation ascending aorta distal to the tube graft at 3.9 cm, moderate dilatation of the innominate artery a 2.1 cm;    . Depression   . ESRD on dialysis [facility])    a. 09/2013 felt to be related to gentamycin b. no longer requiring HD  . Hx of cardiac cath    a. LHC in 02/2010: normal cors  . Hx of echocardiogram 02/2014   Echo (8/15):  Mod LV, EF 35-40%, Gr 1 DD, AVR ok (mean 14 mmHg), mild LAE  . Hypertension   . Hypothyroidism, postsurgical   . Prosthetic valve endocarditis ([place])   . Staphylococcus aureus  bacteremia   . Thyroid cancer ([place])   . Ventricular tachycardia ([place])    a. s/p STJ ICD    Past Surgical History:  Procedure Laterality Date  . AORTIC VALVE REPAIR  1968  . AORTIC VALVE REPLACEMENT  2011  . AV FISTULA PLACEMENT Left 10/08/2013   Procedure: ARTERIOVENOUS (AV) FISTULA CREATION- LEFT ARM; Radial Cephalic ;  Surgeon: Mal [name], MD;  Location: [place];  Service: Vascular;  Laterality: Left;  . BENTALL PROCEDURE N/A 10/28/2013   Procedure: REDO BENTALL PROCEDURE, debridment of aoritc root abscess, replacement of aortic root, ascending aorta and aortic valve with homograft. Insertion of left femoral arterial line;  Surgeon: [name], MD;  Location: [place];  Service: Open Heart Surgery;  Laterality: N/A;  . CARDIAC CATHETERIZATION  03/02/2010   NORMAL CORONARY ARTERY  . CARDIAC CATHETERIZATION N/A 01/01/2015   Procedure: Coronary/Graft Angiography;  Surgeon: Troy Sine, MD;  Location: [place] CV LAB;  Service: Cardiovascular;  Laterality: N/A;  . CARDIAC VALVE REPLACEMENT    . EP IMPLANTABLE DEVICE N/A 01/01/2015   Procedure: ICD Implant;  Surgeon: Deboraha Sprang, MD;  Location: [place] CV LAB;  Service: Cardiovascular;  Laterality: N/A;  . INTRAOPERATIVE TRANSESOPHAGEAL ECHOCARDIOGRAM N/A 10/28/2013   Procedure: INTRAOPERATIVE TRANSESOPHAGEAL ECHOCARDIOGRAM;  Surgeon: [name], MD;  Location: [place];  Service: Open Heart Surgery;  Laterality: N/A;  . PERIPHERAL VASCULAR CATHETERIZATION N/A 01/01/2015   Procedure: Aortic Arch Angiography;  Surgeon:  Troy Sine, MD;  Location: [place] CV LAB;  Service: Cardiovascular;  Laterality: N/A;  . SHOULDER ARTHROSCOPY W/ ROTATOR CUFF REPAIR Right 2012  . STERNOTOMY     REDO  . TEE WITHOUT CARDIOVERSION N/A 10/24/2013   Procedure: TRANSESOPHAGEAL ECHOCARDIOGRAM (TEE);  Surgeon: Dorothy Spark, MD;  Location: [facility] ENDOSCOPY;  Service: Cardiovascular;  Laterality: N/A;  . THYROIDECTOMY  ~ 2005  . TRANSTHORACIC  ECHOCARDIOGRAM  03/2010   SHOWED MILD REDUCTION OF LV FUNCTION    Current Outpatient Prescriptions  Medication Sig Dispense Refill  . aspirin EC 81 MG tablet Take 81 mg by mouth daily.     Marland Kitchen atorvastatin (LIPITOR) 40 MG tablet Take 40 mg by mouth every other day.    . betamethasone valerate ointment (VALISONE) 0.1 % APPLY  OINTMENT TO AFFECTED AREA TWICE DAILY 30 g 2  . carvedilol (COREG) 6.25 MG tablet Take 6.25 mg by mouth 2 (two) times daily with a meal.    . clonazePAM (KLONOPIN) 1 MG tablet TAKE ONE TABLET BY MOUTH TWICE DAILY AS NEEDED(NOT FOR REGULAR USE). 30 tablet 0  . docusate sodium (STOOL SOFTENER) 100 MG capsule Take 100 mg by mouth 2 (two) times daily.    . furosemide (LASIX) 20 MG tablet Take 1 tablet every Monday and Thursday 30 tablet 11  . levothyroxine (SYNTHROID, LEVOTHROID) 150 MCG tablet TAKE ONE TABLET BY MOUTH ONCE DAILY BEFORE BREAKFAST 90 tablet 2  . mexiletine (MEXITIL) 150 MG capsule TAKE 2 CAPSULES BY MOUTH EVERY MORNING AND TAKE 2 CAPSULES EVERY EVENING 360 capsule 3  . Neomycin-Bacitracin-Polymyxin (HCA TRIPLE ANTIBIOTIC OINTMENT EX) Apply 1 application topically 2 (two) times daily.     Marland Kitchen RANEXA 500 MG 12 hr tablet TAKE 1 TABLET BY MOUTH TWICE A DAY 60 tablet 5  . sacubitril-valsartan (ENTRESTO) 49-51 MG Take 1 tablet by mouth 2 (two) times daily. 60 tablet 6  . sertraline (ZOLOFT) 50 MG tablet TAKE ONE TABLET BY MOUTH ONCE DAILY. 30 tablet 5   No current facility-administered medications for this visit.     Allergies  Allergen Reactions  . Oxycodone Other (See Comments)    Gives patient nightmares  . Rifampin Nausea Only      Review of Systems negative except from HPI and PMH  Physical Exam BP 120/86   Pulse 62   Ht 6\' 1"  (1.854 m)   Wt 197 lb (89.4 kg)   SpO2 98%   BMI 25.99 kg/m  Well developed and well nourished in no acute distress HENT normal E scleral and icterus clear Neck Supple JVP flat; carotids brisk and full Clear to  ausculation  Device pocket well healed; without hematoma or erythema.  There is no tethering  regular rate and rhythm, no murmurs gallops or rub Soft with active bowel sounds No clubbing cyanosis  Edema Alert and oriented, grossly normal motor and sensory function Skin Warm and Dry  ECG demonstrates sinus rhythm at 60 20/12/42 Possible anterior wall MI  Assessment and  Plan  Ventricular tachycarda  ICD St Jude  The patient's device was interrogated.  The information was reviewed. No changes were made in the programming.     NICM  CHF chronic systolic  No intercurrent Ventricular tachycardia  We will continue the mexiletine and ranolazine  Euvolemic continue current meds

## 2016-05-10 NOTE — Patient Instructions (Signed)
Medication Instructions: Your physician recommends that you continue on your current medications as directed. Please refer to the Current Medication list given to you today.   Labwork: None Ordered  Procedures/Testing: None Ordered  Follow-Up:  Remote monitoring is used to monitor your ICD from home. This monitoring reduces the number of office visits required to check your device to one time per year. It allows [place] to keep an eye on the functioning of your device to ensure it is working properly. You are scheduled for a device check from home on 08/10/15. You may send your transmission at any time that day. If you have a wireless device, the transmission will be sent automatically. After your physician reviews your transmission, you will receive a postcard with your next transmission date.  Your physician wants you to follow-up in: 1 YEAR with Dr. Caryl Comes. You will receive a reminder letter in the mail two months in advance. If you don't receive a letter, please call our office to schedule the follow-up appointment.    Any Additional Special Instructions Will Be Listed Below (If Applicable).     If you need a refill on your cardiac medications before your next appointment, please call your pharmacy.

## 2016-05-14 ENCOUNTER — Other Ambulatory Visit: Payer: Self-pay | Admitting: Family Medicine

## 2016-05-14 ENCOUNTER — Other Ambulatory Visit: Payer: Self-pay | Admitting: Internal Medicine

## 2016-05-15 ENCOUNTER — Other Ambulatory Visit: Payer: Self-pay | Admitting: Internal Medicine

## 2016-05-15 NOTE — Telephone Encounter (Signed)
Last refill 09/20/2015 #30, 0rf Last OV 03/06/2016 Please advise

## 2016-05-15 NOTE — Telephone Encounter (Signed)
Refill Atorvastatin for one year.  Refill Klonopin once only- avoid regular use.

## 2016-05-16 ENCOUNTER — Other Ambulatory Visit: Payer: Self-pay | Admitting: Family Medicine

## 2016-05-16 NOTE — Telephone Encounter (Signed)
Please ask PCP to refill this.  Patient is now being followed by heart failure clinic and Dr. Caryl Comes.

## 2016-05-19 ENCOUNTER — Telehealth: Payer: Self-pay

## 2016-05-19 NOTE — Telephone Encounter (Signed)
Referred to ICM clinic by Levander Campion, device RN/Dr Caryl Comes.  Attempted ICM phone call for intro and left message to return call.  1st ICM remote transmission scheduled for 05/31/2016 if patient is agreeable when reached.

## 2016-05-23 NOTE — Telephone Encounter (Signed)
Wife returned call (DPR).  ICM intro given and wife agreed to [facility] monthly follow up calls.  Explained program.  1st ICM remote transmission scheduled for 05/31/2016.  Provided ICM direct number and advised to call if patient has any fluid symptoms between monthly transmissions.

## 2016-05-31 ENCOUNTER — Ambulatory Visit (INDEPENDENT_AMBULATORY_CARE_PROVIDER_SITE_OTHER): Payer: Commercial Managed Care - HMO

## 2016-05-31 DIAGNOSIS — I5022 Chronic systolic (congestive) heart failure: Secondary | ICD-10-CM

## 2016-05-31 DIAGNOSIS — Z95810 Presence of automatic (implantable) cardiac defibrillator: Secondary | ICD-10-CM

## 2016-05-31 NOTE — Progress Notes (Signed)
EPIC Encounter for ICM Monitoring  Patient Name: Bradley [name] is a 60 y.o. male Date: 05/31/2016 Primary Care Physican: [name], MD Primary Cardiologist: Ross/Bensimhon Electrophysiologist: Caryl Comes Nephrologist:  Deterding Dry Weight: 197 lb       Spoke with wife (DPR).  Heart Failure questions reviewed, pt asymptomatic   Thoracic impedance normal   Recommendations:  No changes.  Advised to limit salt intake to 2000 mg daily.  Encouraged to call for fluid symptoms.    Follow-up plan: ICM clinic phone appointment on 07/04/2016.  Copy of ICM check sent to device physician.   ICM trend: 05/31/2016       [name], RN 05/31/2016 2:41 PM

## 2016-06-23 ENCOUNTER — Other Ambulatory Visit: Payer: Self-pay | Admitting: Internal Medicine

## 2016-06-25 ENCOUNTER — Other Ambulatory Visit: Payer: Self-pay | Admitting: Internal Medicine

## 2016-07-04 ENCOUNTER — Ambulatory Visit (INDEPENDENT_AMBULATORY_CARE_PROVIDER_SITE_OTHER): Payer: Commercial Managed Care - HMO

## 2016-07-04 ENCOUNTER — Telehealth: Payer: Self-pay

## 2016-07-04 DIAGNOSIS — I5022 Chronic systolic (congestive) heart failure: Secondary | ICD-10-CM

## 2016-07-04 DIAGNOSIS — I5042 Chronic combined systolic (congestive) and diastolic (congestive) heart failure: Secondary | ICD-10-CM | POA: Diagnosis not present

## 2016-07-04 DIAGNOSIS — Z95810 Presence of automatic (implantable) cardiac defibrillator: Secondary | ICD-10-CM | POA: Diagnosis not present

## 2016-07-04 NOTE — Telephone Encounter (Signed)
Remote ICM transmission received.  Attempted patient call and left message to return call.   

## 2016-07-04 NOTE — Progress Notes (Signed)
EPIC Encounter for ICM Monitoring  Patient Name: Bradley [name] is a 60 y.o. male Date: 07/04/2016 Primary Care Physican: [name], MD Primary Cardiologist: Ross/Bensimhon Electrophysiologist: Caryl Comes Nephrologist:  Deterding Dry Weight:    unknown      Attempted ICM call and unable to reach. Left message to return call.  Transmission reviewed.   Thoracic impedance abnormal suggesting fluid accumulation since 06/26/2016.  Labs: 04/24/2016 Creatinine 1.29, BUN 16, Potassium 5.8, Sodium 139 03/06/2016 Creatinine 1.40, BUN 19, Potassium 5.4, Sodium 140, EGFR 55.2  12/06/2015 Creatinine 1.34, BUN 17, Potassium 5.2, Sodium 140  10/14/2015 Creatinine 1.28, BUN 22, Potassium 4.5, Sodium 140  07/12/2015 Creatinine 1.21, BUN 28, Potassium 5.2, Sodium 139   Recommendations: NONE - Unable to reach  Follow-up plan: ICM clinic phone appointment on 07/13/2016 to recheck fluid levels.  HF clinic appointment on 12/27 with Dr Haroldine Laws  Copy of ICM check sent to primary cardiologist and device physician.   ICM trend: 07/04/2016       [name], RN 07/04/2016 3:11 PM

## 2016-07-13 ENCOUNTER — Ambulatory Visit (INDEPENDENT_AMBULATORY_CARE_PROVIDER_SITE_OTHER): Payer: Commercial Managed Care - HMO

## 2016-07-13 ENCOUNTER — Telehealth: Payer: Self-pay

## 2016-07-13 DIAGNOSIS — I5022 Chronic systolic (congestive) heart failure: Secondary | ICD-10-CM

## 2016-07-13 DIAGNOSIS — Z95810 Presence of automatic (implantable) cardiac defibrillator: Secondary | ICD-10-CM

## 2016-07-13 NOTE — Telephone Encounter (Signed)
Remote ICM transmission received.  Attempted patient call and left detailed message regarding transmission

## 2016-07-13 NOTE — Progress Notes (Signed)
EPIC Encounter for ICM Monitoring  Patient Name: Bradley [name] is a 60 y.o. male Date: 07/13/2016 Primary Care Physican: [name], MD Primary Cardiologist:Ross/Bensimhon Electrophysiologist: Caryl Comes Nephrologist:Deterding Dry Weight:unknown                                       Attempted ICM call and unable to reach. Left message to return call.  Transmission reviewed.   Thoracic impedance returned to normal on 12/10 and had been abnormal suggesting fluid accumulation from 06/26/2016 to 07/09/2016.  Labs: 04/24/2016 Creatinine 1.29, BUN 16, Potassium 5.8, Sodium 139 03/06/2016 Creatinine 1.40, BUN 19, Potassium 5.4, Sodium 140, EGFR 55.2  12/06/2015 Creatinine 1.34, BUN 17, Potassium 5.2, Sodium 140  10/14/2015 Creatinine 1.28, BUN 22, Potassium 4.5, Sodium 140  07/12/2015 Creatinine 1.21, BUN 28, Potassium 5.2, Sodium 139   Recommendations:  NONE - Unable to reach    Follow-up plan: ICM clinic phone appointment on 08/15/2016.  Office appointment with Dr Haroldine Laws 07/26/2016  Copy of ICM check sent to primary cardiologist and device physician for updated transmission showing impedance returned to normal.   ICM trend: 07/13/2016       [name], RN 07/13/2016 3:31 PM

## 2016-07-16 ENCOUNTER — Other Ambulatory Visit: Payer: Self-pay | Admitting: Internal Medicine

## 2016-07-23 ENCOUNTER — Other Ambulatory Visit: Payer: Self-pay | Admitting: Internal Medicine

## 2016-07-23 DIAGNOSIS — I10 Essential (primary) hypertension: Secondary | ICD-10-CM

## 2016-07-26 ENCOUNTER — Ambulatory Visit (HOSPITAL_COMMUNITY)
Admission: RE | Admit: 2016-07-26 | Discharge: 2016-07-26 | Disposition: A | Discharge status: Home / Self Care | Payer: Commercial Managed Care - HMO | Source: Ambulatory Visit | Attending: Internal Medicine | Admitting: Internal Medicine

## 2016-07-26 ENCOUNTER — Encounter: Payer: Self-pay | Admitting: Internal Medicine

## 2016-07-26 ENCOUNTER — Encounter (HOSPITAL_COMMUNITY): Payer: Self-pay | Admitting: Internal Medicine

## 2016-07-26 VITALS — BP 118/76 | HR 68 | Wt 200.5 lb

## 2016-07-26 DIAGNOSIS — I10 Essential (primary) hypertension: Secondary | ICD-10-CM | POA: Diagnosis not present

## 2016-07-26 DIAGNOSIS — Z992 Dependence on renal dialysis: Secondary | ICD-10-CM | POA: Diagnosis not present

## 2016-07-26 DIAGNOSIS — I472 Ventricular tachycardia, unspecified: Secondary | ICD-10-CM

## 2016-07-26 DIAGNOSIS — F419 Anxiety disorder, unspecified: Secondary | ICD-10-CM | POA: Diagnosis not present

## 2016-07-26 DIAGNOSIS — Z823 Family history of stroke: Secondary | ICD-10-CM | POA: Diagnosis not present

## 2016-07-26 DIAGNOSIS — N182 Chronic kidney disease, stage 2 (mild): Secondary | ICD-10-CM | POA: Diagnosis not present

## 2016-07-26 DIAGNOSIS — Z808 Family history of malignant neoplasm of other organs or systems: Secondary | ICD-10-CM | POA: Diagnosis not present

## 2016-07-26 DIAGNOSIS — Z79899 Other long term (current) drug therapy: Secondary | ICD-10-CM | POA: Diagnosis not present

## 2016-07-26 DIAGNOSIS — Z7982 Long term (current) use of aspirin: Secondary | ICD-10-CM | POA: Insufficient documentation

## 2016-07-26 DIAGNOSIS — F411 Generalized anxiety disorder: Secondary | ICD-10-CM

## 2016-07-26 DIAGNOSIS — Z95810 Presence of automatic (implantable) cardiac defibrillator: Secondary | ICD-10-CM | POA: Insufficient documentation

## 2016-07-26 DIAGNOSIS — Z953 Presence of xenogenic heart valve: Secondary | ICD-10-CM | POA: Insufficient documentation

## 2016-07-26 DIAGNOSIS — I428 Other cardiomyopathies: Secondary | ICD-10-CM | POA: Insufficient documentation

## 2016-07-26 DIAGNOSIS — I5042 Chronic combined systolic (congestive) and diastolic (congestive) heart failure: Secondary | ICD-10-CM

## 2016-07-26 DIAGNOSIS — Z8249 Family history of ischemic heart disease and other diseases of the circulatory system: Secondary | ICD-10-CM | POA: Insufficient documentation

## 2016-07-26 DIAGNOSIS — Z85850 Personal history of malignant neoplasm of thyroid: Secondary | ICD-10-CM | POA: Diagnosis not present

## 2016-07-26 DIAGNOSIS — I132 Hypertensive heart and chronic kidney disease with heart failure and with stage 5 chronic kidney disease, or end stage renal disease: Secondary | ICD-10-CM | POA: Insufficient documentation

## 2016-07-26 DIAGNOSIS — F329 Major depressive disorder, single episode, unspecified: Secondary | ICD-10-CM | POA: Diagnosis not present

## 2016-07-26 DIAGNOSIS — N186 End stage renal disease: Secondary | ICD-10-CM | POA: Insufficient documentation

## 2016-07-26 DIAGNOSIS — I5022 Chronic systolic (congestive) heart failure: Secondary | ICD-10-CM | POA: Insufficient documentation

## 2016-07-26 DIAGNOSIS — E890 Postprocedural hypothyroidism: Secondary | ICD-10-CM | POA: Insufficient documentation

## 2016-07-26 LAB — BASIC METABOLIC PANEL
Anion gap: 6 (ref 5–15)
BUN: 17 mg/dL (ref 6–20)
CO2: 24 mmol/L (ref 22–32)
Calcium: 9.1 mg/dL (ref 8.9–10.3)
Chloride: 110 mmol/L (ref 101–111)
Creatinine, Ser: 1.13 mg/dL (ref 0.61–1.24)
GFR calc Af Amer: >60 mL/min (ref >60)
GFR calc non Af Amer: >60 mL/min (ref >60)
Glucose, Bld: 85 mg/dL (ref 65–99)
Potassium: 5.5 mmol/L — ABNORMAL HIGH (ref 3.5–5.1)
Sodium: 140 mmol/L (ref 135–145)

## 2016-07-26 MED ORDER — SACUBITRIL-VALSARTAN 97-103 MG PO TABS: 1.0000 | ORAL_TABLET | Freq: Two times a day (BID) | ORAL | 6 refills | Status: DC

## 2016-07-26 NOTE — Patient Instructions (Signed)
INCREASE Enresto to 97/103 mg ,one tab twice a day  Labs today We will only contact you if something comes back abnormal or we need to make some changes. Otherwise no news is good news!   Labs needed again in 10-14 days  Your physician has requested that you have an echocardiogram. Echocardiography is a painless test that uses sound waves to create images of your heart. It provides your doctor with information about the size and shape of your heart and how well your heart's chambers and valves are working. This procedure takes approximately one hour. There are no restrictions for this procedure.  Your physician recommends that you schedule a follow-up appointment in: 4 months with Dr Haroldine Laws and echo  Do the following things EVERYDAY: 1) Weigh yourself in the morning before breakfast. Write it down and keep it in a log. 2) Take your medicines as prescribed 3) Eat low salt foods--Limit salt (sodium) to 2000 mg per day.  4) Stay as active as you can everyday 5) Limit all fluids for the day to less than 2 liters

## 2016-07-26 NOTE — Progress Notes (Signed)
Patient ID: Bradley [name], male   DOB: Sep 29, 1955, 60 y.o.   MRN: [id number]    Advanced Heart Failure Clinic Note   Referring Physician: Chanetta Marshall, NP Primary Care: Bradley Bradley [name] Primary Cardiologist: Bradley [name] Primary HF: Bradley. Haroldine [name]  HPI:  Bradley [name] is a 60 y.o. male with a complicated past medical history including thyroid cancer (s/p thyroidectomy), hypertension, depression, and congenital aortic stenosis for which he underwent aortic vavulotomy at age 69 with subsequent Bentall procedure in 2001 with a pericardial tissue valve. He had MSSA bacteremia and bacterial endocarditis in 01/8294 complicated by renal failure requiring dialysis for a short period of time. 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   He was admitted 11/2014 with progressive episodes of palpitations and pre-syncope. He was found to have monomorphic ventricular tachycardia. Catheterization demonstrated normal coronaries. Echocardiogram demonstrated EF 30-35% and he underwent STJ dual chamber ICD implant. Mexiletine was added prior to discharge and his Metoprolol was changed to Coreg. He had subsequent ventricular arrhythmias and his Mexelitine was increased with no further VT.  CPX test performed 10/25/15 showed a moderate functional impairment (pVO2 16.1). Resting spirometry reveals mild obstruction/restriction. There was also evidence of chronotropic incompetence.   He presents today for regular follow up.  No problems since last visit. Overall feeling good. Occasionally gets lightheadedness with rapid standing but not marked or limiting. No CP, [place]. No problems on flat ground. Does get SOB up steps. Has not gone back to [facility], has a lot going on over the holidays. Weight at home 193-195 lbs.  Occasional edema, but thinks he  feels it more in his face. Takes lasix 2-3 times a week. Hasn't needed more than 3 in a week.   Corevue interrogated personally: Volume status stable currently. Had 2 weeks of volume overload after  Thanksgiving. Pt activity 2-3 hrs daily. No VT/VF.   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 Lives at home with wife and 3/4 of his kids. 2 sons and 2 daughters.   CPX 10/25/15 FVC 3.47 (66%) FEV1 2.86 (71%) Resting HR 60 Peak 112 Resting BP 130/80 Peak 190/88 Peak V02 16.1 (55.2%) AO/ZH08 slope: 35 OUES: 1.69 Peak RER 1.13 Ventilatory threshold 10.9 (37.4%) Peak RR 40 Peak Ventilation 72.2 VE/MVV 51% PETCO2 at peak: 31 O2 pulse 13 (81%)  Echo 04/2013 LVEF 60-65% Echo 08/2013 LVEF 45-50% Echo 11/2013 LVEF 20-25% Echo 02/2014 LVEF 35-40% Echo 12/30/14 LVEF 30-35%, Mild MR, Mod LAE, RV mildly dilated, Mild RAE Echo 5/17 25-30%   Past Medical History:  Diagnosis Date  . Anxiety   . Aortic stenosis    s/p Bentall with bioprosthetic AVR 02/2010; Last echo (9/11): Moderate LVH, EF 45-50%, AVR functioning appropriately, aortic valve mean gradient 21, diastolic dysfunction. Chest MRA (2/13): Mild to moderate dilatation at the sinus of Valsalva at 4.1 cm, mild dilatation ascending aorta distal to the tube graft at 3.9 cm, moderate dilatation of the innominate artery a 2.1 cm;    . Depression   . ESRD on dialysis [facility])    a. 09/2013 felt to be related to gentamycin b. no longer requiring HD  . Hx of cardiac cath    a. LHC in 02/2010: normal cors  . Hx of echocardiogram 02/2014   Echo (8/15):  Mod LV, EF 35-40%, Gr 1 DD, AVR ok (mean 14 mmHg),  mild LAE  . Hypertension   . Hypothyroidism, postsurgical   . Prosthetic valve endocarditis ([place])   . Staphylococcus aureus bacteremia   . Thyroid cancer ([place])   . Ventricular tachycardia ([place])    a. s/p STJ ICD    Current Outpatient Prescriptions  Medication Sig Dispense Refill  . aspirin EC 81 MG tablet Take 81 mg by mouth daily.     Marland Kitchen  atorvastatin (LIPITOR) 40 MG tablet Take 40 mg by mouth every other day.    . betamethasone valerate ointment (VALISONE) 0.1 % APPLY  OINTMENT TO AFFECTED AREA TWICE DAILY 30 g 2  . carvedilol (COREG) 6.25 MG tablet Take 6.25 mg by mouth 2 (two) times daily with a meal.    . clonazePAM (KLONOPIN) 1 MG tablet TAKE ONE TABLET BY MOUTH TWICE DAILY AS NEEDED(NOT FOR REGULAR USE). 30 tablet 0  . docusate sodium (STOOL SOFTENER) 100 MG capsule Take 100 mg by mouth 2 (two) times daily.    . furosemide (LASIX) 20 MG tablet TAKE 1 TABLET EVERY MONDAY AND THURSDAY 25 tablet 3  . levothyroxine (SYNTHROID, LEVOTHROID) 150 MCG tablet TAKE ONE TABLET BY MOUTH ONCE DAILY BEFORE BREAKFAST 90 tablet 2  . mexiletine (MEXITIL) 150 MG capsule TAKE 2 CAPSULES BY MOUTH EVERY MORNING AND TAKE 2 CAPSULES EVERY EVENING 120 capsule 0  . Neomycin-Bacitracin-Polymyxin (HCA TRIPLE ANTIBIOTIC OINTMENT EX) Apply 1 application topically 2 (two) times daily.     . ranolazine (RANEXA) 500 MG 12 hr tablet Take 1 tablet (500 mg total) by mouth 2 (two) times daily. *Please call and schedule an appointment with Bradley Harrington Challenger* 60 tablet 0  . sacubitril-valsartan (ENTRESTO) 49-51 MG Take 1 tablet by mouth 2 (two) times daily. 60 tablet 6  . sertraline (ZOLOFT) 50 MG tablet TAKE ONE TABLET BY MOUTH ONCE DAILY. 30 tablet 5   No current facility-administered medications for this encounter.     Allergies  Allergen Reactions  . Oxycodone Other (See Comments)    Gives patient nightmares  . Rifampin Nausea Only      Social History   Social History  . Marital status: Married    Spouse name: N/A  . Number of children: N/A  . Years of education: N/A   Occupational History  . Not on file.   Social History Main Topics  . Smoking status: Never Smoker  . Smokeless tobacco: Never Used  . Alcohol use No     Comment: Occasional  . Drug use: No  . Sexual activity: No   Other Topics Concern  . Not on file   Social History Narrative    Lives at home with walker.  Does not drive.  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 Hypertension Mother   . Heart disease Father   . Stroke Father   . Hypertension Father   . Thyroid cancer Sister   . Thyroid cancer Sister   . Kidney Stones Sister   . Heart attack Neg Hx     Vitals:   07/26/16 1044  BP: 118/76  Pulse: 68  SpO2: 99%  Weight: 200 lb 8 oz (90.9 kg)   Wt Readings from Last 3 Encounters:  07/26/16 200 lb 8 oz (90.9 kg)  05/10/16 197 lb (89.4 kg)  04/24/16 201 lb 12.8 oz (91.5 kg)     PHYSICAL EXAM: General:  Well appearing.   HEENT: Normal Neck: supple.  JVP 6-7 cm. Carotids 2+ bilat; no bruits. No thyromegaly or nodule noted. Cor: PMI nondisplaced. RRR. 2/6 SEM HSB Lungs: Clear, normal effort Abdomen: soft, NT, ND, no HSM. No bruits or masses. +BS   Extremities: no cyanosis, clubbing, rash. No peripheral edema.  Neuro: alert & oriented x 3, cranial nerves grossly intact. moves all 4 extremities w/o difficulty. Affect pleasant.   ASSESSMENT & PLAN:  1. Chronic systolic HF - Echo 9/03 LVEF 25-30%, Mild MR, Mod LAE, RV mildly dilated, Mild RAE.  2/2 NICM - Volume status stable by exam and Corevue. NYHA class II. CPX with moderate HF limitation. - Continue lasix at current dose - s/p SJ ICD - Increase Entresto to 97/103. Needs BMET today and repeat 10 days. - Continue coreg 6.25 mg BID.   2. VT/NICM - On mexilitene/Ranexa - No VT/VF on ICD.  3. HTN - Meds as above.  4. Valvular heart disease s/p bentall in 2001 and re-do bentall 2015 5. Depression - Mood improved as of late.   Volume status stable on exam. Increase Entresto as above. BMET today and repeat 10 days or so.  Follow up 4 months with Echo.   [name], PA-C 11:09 AM   Patient seen and examined with Oda Kilts, PA-C. We discussed all aspects of the encounter. I agree with the assessment and plan as stated above.     Doing well. NYHA II. Volume status looks good. Will increase Entresto and see if BP can tolerate.   Bradley Hopkinson,MD 12:12 PM

## 2016-07-27 ENCOUNTER — Other Ambulatory Visit: Payer: Self-pay | Admitting: *Deleted

## 2016-07-27 MED ORDER — MEXILETINE HCL 150 MG PO CAPS: ORAL_CAPSULE | ORAL | 0 refills | Status: DC

## 2016-07-30 ENCOUNTER — Other Ambulatory Visit: Payer: Self-pay | Admitting: Internal Medicine

## 2016-07-30 DIAGNOSIS — I10 Essential (primary) hypertension: Secondary | ICD-10-CM

## 2016-08-01 NOTE — Telephone Encounter (Signed)
Medication Detail    Disp Refills Start End   furosemide (LASIX) 20 MG tablet 25 tablet 3 07/25/2016    Sig: TAKE 1 TABLET EVERY MONDAY AND THURSDAY   E-Prescribing Status: Receipt confirmed by pharmacy (07/25/2016 1:36 PM EST)   Associated Diagnoses   Essential hypertension     Pharmacy   [id number] - [place], [place] - [address]

## 2016-08-07 ENCOUNTER — Telehealth (HOSPITAL_COMMUNITY): Payer: Self-pay | Admitting: *Deleted

## 2016-08-07 DIAGNOSIS — I10 Essential (primary) hypertension: Secondary | ICD-10-CM

## 2016-08-07 NOTE — Telephone Encounter (Signed)
Status:  Final result Visible to patient:  Yes (MyChart) Dx:  Chronic combined systolic and diastol...  Notes Recorded by Kennieth Rad, RN on 08/07/2016 at 11:07 AM EST Patient wife called back and they can come in for repeat BMET this Wednesday at 1:45. Patient has been added to lab schedule and lab order placed in epic. ------  Notes Recorded by Scarlette Calico, RN on 08/03/2016 at 2:12 PM EST Attempted to call pt, neither number will allow a VM to be left at this time ------  Notes Recorded by Harvie Junior, [place] on 07/27/2016 at 1:56 PM EST Left vm for pt to call for lab results. ------  Notes Recorded by Jolaine Artist, MD on 07/26/2016 at 10:46 PM EST Hemolyzed. Please repeat in 1-2 weeks.

## 2016-08-09 ENCOUNTER — Ambulatory Visit (HOSPITAL_COMMUNITY)
Admission: RE | Admit: 2016-08-09 | Discharge: 2016-08-09 | Disposition: A | Discharge status: Home / Self Care | Payer: Commercial Managed Care - HMO | Source: Ambulatory Visit | Attending: Cardiology | Admitting: Cardiology

## 2016-08-09 DIAGNOSIS — I10 Essential (primary) hypertension: Secondary | ICD-10-CM

## 2016-08-09 LAB — BASIC METABOLIC PANEL
Anion gap: 5 (ref 5–15)
BUN: 19 mg/dL (ref 6–20)
CO2: 25 mmol/L (ref 22–32)
Calcium: 9.3 mg/dL (ref 8.9–10.3)
Chloride: 112 mmol/L — ABNORMAL HIGH (ref 101–111)
Creatinine, Ser: 1.19 mg/dL (ref 0.61–1.24)
GFR calc Af Amer: >60 mL/min (ref >60)
GFR calc non Af Amer: >60 mL/min (ref >60)
Glucose, Bld: 97 mg/dL (ref 65–99)
Potassium: 3.9 mmol/L (ref 3.5–5.1)
Sodium: 142 mmol/L (ref 135–145)

## 2016-08-15 ENCOUNTER — Ambulatory Visit (INDEPENDENT_AMBULATORY_CARE_PROVIDER_SITE_OTHER): Payer: Commercial Managed Care - HMO | Admitting: *Deleted

## 2016-08-15 DIAGNOSIS — Z95810 Presence of automatic (implantable) cardiac defibrillator: Secondary | ICD-10-CM

## 2016-08-15 DIAGNOSIS — I428 Other cardiomyopathies: Secondary | ICD-10-CM | POA: Diagnosis not present

## 2016-08-15 DIAGNOSIS — I5042 Chronic combined systolic (congestive) and diastolic (congestive) heart failure: Secondary | ICD-10-CM

## 2016-08-15 NOTE — Progress Notes (Signed)
Remote ICD transmission.   

## 2016-08-15 NOTE — Progress Notes (Signed)
EPIC Encounter for ICM Monitoring  Patient Name: Bradley [name] is a 61 y.o. male Date: 08/15/2016 Primary Care Physican: [name], MD Primary Cardiologist:Ross/Bensimhon Electrophysiologist: Caryl Comes Nephrologist:Deterding Dry Weight:unknown   Attempted ICM call and unable to reach.Left message to return call. Transmission reviewed.   Thoracic impedance normal   Labs: 04/24/2016 Creatinine 1.29, BUN 16, Potassium 5.8, Sodium 139 03/06/2016 Creatinine 1.40, BUN 19, Potassium 5.4, Sodium 140, EGFR 55.2  12/06/2015 Creatinine 1.34, BUN 17, Potassium 5.2, Sodium 140  10/14/2015 Creatinine 1.28, BUN 22, Potassium 4.5, Sodium 140  12/12/2016Creatinine 1.21, BUN 28, Potassium 5.2, Sodium 139   Recommendations:  None - unable to reach  Follow-up plan: ICM clinic phone appointment on 09/15/2016.  Copy of ICM check sent to device physician.   3 month ICM trend: 08/15/2016   1 Year ICM trend:     [name], RN 08/15/2016 5:13 PM

## 2016-08-19 ENCOUNTER — Other Ambulatory Visit: Payer: Self-pay | Admitting: Internal Medicine

## 2016-08-19 LAB — CUP PACEART REMOTE DEVICE CHECK
Battery Remaining Longevity: 78 mo
Battery Remaining Percentage: 76 %
Battery Voltage: 2.96 V
Brady Statistic RV Percent Paced: 1 %
Date Time Interrogation Session: 20180116070016
HighPow Impedance: 70 Ohm
HighPow Impedance: 70 Ohm
Implantable Lead Implant Date: 20160603
Implantable Lead Location: 753860
Implantable Lead Model: 7122
Implantable Pulse Generator Implant Date: 20160603
Lead Channel Impedance Value: 410 Ohm
Lead Channel Pacing Threshold Amplitude: 1 V
Lead Channel Pacing Threshold Pulse Width: 0.5 ms
Lead Channel Sensing Intrinsic Amplitude: 11.7 mV
Lead Channel Setting Pacing Amplitude: 2.5 V
Lead Channel Setting Pacing Pulse Width: 0.5 ms
Lead Channel Setting Sensing Sensitivity: 0.5 mV
Pulse Gen Serial Number: 7135169

## 2016-08-23 ENCOUNTER — Encounter: Payer: Self-pay | Admitting: Cardiology

## 2016-09-02 ENCOUNTER — Other Ambulatory Visit: Payer: Self-pay | Admitting: Internal Medicine

## 2016-09-05 NOTE — Telephone Encounter (Signed)
Please advise on refill request as patient is overdue for an appointment with Dr Harrington Challenger and he has been given several warnings on his refills to call and schedule. Thanks, MI

## 2016-09-06 ENCOUNTER — Encounter: Payer: Self-pay | Admitting: Cardiology

## 2016-09-07 NOTE — Telephone Encounter (Signed)
Please refill.  I will call him.  Thank you

## 2016-09-08 ENCOUNTER — Other Ambulatory Visit: Payer: Self-pay | Admitting: *Deleted

## 2016-09-08 MED ORDER — RANOLAZINE ER 500 MG PO TB12: 500.0000 mg | ORAL_TABLET | Freq: Two times a day (BID) | ORAL | 0 refills | Status: DC

## 2016-09-08 MED ORDER — MEXILETINE HCL 150 MG PO CAPS: ORAL_CAPSULE | ORAL | 0 refills | Status: DC

## 2016-09-15 ENCOUNTER — Telehealth: Payer: Self-pay

## 2016-09-15 ENCOUNTER — Ambulatory Visit (INDEPENDENT_AMBULATORY_CARE_PROVIDER_SITE_OTHER): Payer: Medicare HMO

## 2016-09-15 DIAGNOSIS — I5042 Chronic combined systolic (congestive) and diastolic (congestive) heart failure: Secondary | ICD-10-CM

## 2016-09-15 DIAGNOSIS — Z95810 Presence of automatic (implantable) cardiac defibrillator: Secondary | ICD-10-CM

## 2016-09-15 NOTE — Telephone Encounter (Signed)
Attempted ICM call and left message requesting to send remote ICM transmission.

## 2016-09-18 NOTE — Progress Notes (Signed)
EPIC Encounter for ICM Monitoring  Patient Name: Bradley [name] is a 61 y.o. male Date: 09/18/2016 Primary Care Physican: [name], MD Primary Cardiologist:Ross/Bensimhon Electrophysiologist: Caryl Comes Nephrologist:Deterding Dry Weight:unknown        Spoke with wife. Heart Failure questions reviewed, pt asymptomatic    Thoracic impedance normal but was abnormal suggesting fluid accumulation 08/30/2016 to 09/09/2016.  Current prescribed dose of Furosemide 20 mg 1 tablet every Monday and Thursday.   Labs: 08/09/2016 Creatinine 1.19, BUN 19, Potassium 3.9, Sodium 142, EGFR > 60 07/26/2016 Creatinine 1.13, BUN 17, Potassium 5.5 (H), Sodium 140, EGFR > 60 04/24/2016 Creatinine 1.29, BUN 16, Potassium 5.8, Sodium 139 03/06/2016 Creatinine 1.40, BUN 19, Potassium 5.4, Sodium 140, EGFR 55.2  12/06/2015 Creatinine 1.34, BUN 17, Potassium 5.2, Sodium 140  10/14/2015 Creatinine 1.28, BUN 22, Potassium 4.5, Sodium 140  12/12/2016Creatinine 1.21, BUN 28, Potassium 5.2, Sodium 139   Recommendations: No changes. Discussed diet and she stated he likes to eat [place] food once a week.  Advised restaurant food including Chinese is very high in salt.  She said she also like to eat a lot of sweet and explained salt is also in sweets.  Advised to review food labels and to limit salt intake to 2000 mg/day and fluid intake to < 2 liters/day. Encouraged to call for fluid symptoms.  Follow-up plan: ICM clinic phone appointment on 10/19/2016.  Copy of ICM check sent to device physician.   3 month ICM trend: 09/16/2016   1 Year ICM trend:      [name], RN 09/18/2016 8:35 AM

## 2016-10-01 ENCOUNTER — Other Ambulatory Visit: Payer: Self-pay | Admitting: Internal Medicine

## 2016-10-02 ENCOUNTER — Other Ambulatory Visit: Payer: Self-pay | Admitting: Family Medicine

## 2016-10-02 DIAGNOSIS — I110 Hypertensive heart disease with heart failure: Secondary | ICD-10-CM

## 2016-10-02 NOTE — Telephone Encounter (Signed)
Pt is Dr. Haroldine Laws pt.

## 2016-10-04 ENCOUNTER — Other Ambulatory Visit (INDEPENDENT_AMBULATORY_CARE_PROVIDER_SITE_OTHER): Payer: Commercial Managed Care - HMO

## 2016-10-04 ENCOUNTER — Other Ambulatory Visit: Payer: Self-pay | Admitting: Internal Medicine

## 2016-10-04 DIAGNOSIS — I110 Hypertensive heart disease with heart failure: Secondary | ICD-10-CM

## 2016-10-04 DIAGNOSIS — I509 Heart failure, unspecified: Secondary | ICD-10-CM

## 2016-10-04 LAB — BASIC METABOLIC PANEL
BUN: 20 mg/dL (ref 6–23)
CO2: 30 mEq/L (ref 19–32)
Calcium: 9.8 mg/dL (ref 8.4–10.5)
Chloride: 106 mEq/L (ref 96–112)
Creatinine, Ser: 1.23 mg/dL (ref 0.40–1.50)
GFR: 63.87 mL/min (ref >60.00)
Glucose, Bld: 90 mg/dL (ref 70–99)
Potassium: 4 mEq/L (ref 3.5–5.1)
Sodium: 140 mEq/L (ref 135–145)

## 2016-10-05 NOTE — Telephone Encounter (Signed)
Approved    Disp Refills Start End  mexiletine (MEXITIL) 150 MG capsule 120 capsule 0 10/02/2016   Sig:  TAKE 2 CAPSULES BY MOUTH EVERY MORNING AND TAKE 2 CAPSULES EVERY EVENING  Class:  Normal  DAW:  No  Authorizing Provider:  Larey Dresser, MD  Ordering User:  Kerry Dory, CMA  RANEXA 500 MG 12 hr tablet 60 tablet 0 10/02/2016   Sig:  TAKE 1 TABLET BY MOUTH TWICE A DAY  Class:  Normal  DAW:  No  Authorizing Provider:  Larey Dresser, MD  Ordering User:  Kerry Dory, [place]   [id number] - [place], [place] - [address]

## 2016-10-06 NOTE — Progress Notes (Signed)
ICM remote transmission rescheduled from 10/19/2016 to 10/16/2016.

## 2016-10-16 ENCOUNTER — Ambulatory Visit (INDEPENDENT_AMBULATORY_CARE_PROVIDER_SITE_OTHER): Payer: Medicare HMO

## 2016-10-16 ENCOUNTER — Telehealth: Payer: Self-pay

## 2016-10-16 DIAGNOSIS — I5042 Chronic combined systolic (congestive) and diastolic (congestive) heart failure: Secondary | ICD-10-CM

## 2016-10-16 DIAGNOSIS — Z95810 Presence of automatic (implantable) cardiac defibrillator: Secondary | ICD-10-CM

## 2016-10-16 NOTE — Progress Notes (Signed)
EPIC Encounter for ICM Monitoring  Patient Name: Bradley [name] is a 61 y.o. male Date: 10/16/2016 Primary Care Physican: [name], MD Primary Cardiologist:Ross/Bensimhon Electrophysiologist: Caryl Comes Nephrologist:Deterding Dry Weight:unknown       Attempted call to patient and unable to reach.  Left message to return call.  Transmission reviewed.    Thoracic impedance normal.  Current prescribed dose of Furosemide 20 mg 1 tablet every Monday and Thursday.   Labs: 08/09/2016 Creatinine 1.19, BUN 19, Potassium 3.9, Sodium 142, EGFR > 60 07/26/2016 Creatinine 1.13, BUN 17, Potassium 5.5 (H), Sodium 140, EGFR > 60 04/24/2016 Creatinine 1.29, BUN 16, Potassium 5.8, Sodium 139 03/06/2016 Creatinine 1.40, BUN 19, Potassium 5.4, Sodium 140, EGFR 55.2  12/06/2015 Creatinine 1.34, BUN 17, Potassium 5.2, Sodium 140  10/14/2015 Creatinine 1.28, BUN 22, Potassium 4.5, Sodium 140  12/12/2016Creatinine 1.21, BUN 28, Potassium 5.2, Sodium 139   Recommendations: NONE - Unable to reach patient   Follow-up plan: ICM clinic phone appointment on 11/14/2016.  Copy of ICM check sent to device physician.   3 month ICM trend: 10/16/2016   1 Year ICM trend:      [name], RN 10/16/2016 8:30 AM

## 2016-10-16 NOTE — Telephone Encounter (Signed)
Remote ICM transmission received.  Attempted patient call and left message to return call.   

## 2016-10-22 ENCOUNTER — Other Ambulatory Visit: Payer: Self-pay | Admitting: Family Medicine

## 2016-10-22 ENCOUNTER — Emergency Department (HOSPITAL_COMMUNITY)
Admission: EM | Admit: 2016-10-22 | Discharge: 2016-10-22 | Disposition: A | Discharge status: Home / Self Care | Payer: Medicare HMO | Attending: Emergency Medicine | Admitting: Emergency Medicine

## 2016-10-22 ENCOUNTER — Encounter (HOSPITAL_COMMUNITY): Payer: Self-pay

## 2016-10-22 DIAGNOSIS — Z7982 Long term (current) use of aspirin: Secondary | ICD-10-CM | POA: Insufficient documentation

## 2016-10-22 DIAGNOSIS — I5042 Chronic combined systolic (congestive) and diastolic (congestive) heart failure: Secondary | ICD-10-CM | POA: Diagnosis not present

## 2016-10-22 DIAGNOSIS — N186 End stage renal disease: Secondary | ICD-10-CM | POA: Insufficient documentation

## 2016-10-22 DIAGNOSIS — E039 Hypothyroidism, unspecified: Secondary | ICD-10-CM | POA: Diagnosis not present

## 2016-10-22 DIAGNOSIS — I252 Old myocardial infarction: Secondary | ICD-10-CM | POA: Diagnosis not present

## 2016-10-22 DIAGNOSIS — R55 Syncope and collapse: Secondary | ICD-10-CM | POA: Insufficient documentation

## 2016-10-22 DIAGNOSIS — Z992 Dependence on renal dialysis: Secondary | ICD-10-CM | POA: Diagnosis not present

## 2016-10-22 DIAGNOSIS — Z79899 Other long term (current) drug therapy: Secondary | ICD-10-CM | POA: Diagnosis not present

## 2016-10-22 DIAGNOSIS — R001 Bradycardia, unspecified: Secondary | ICD-10-CM | POA: Diagnosis not present

## 2016-10-22 DIAGNOSIS — I1311 Hypertensive heart and chronic kidney disease without heart failure, with stage 5 chronic kidney disease, or end stage renal disease: Secondary | ICD-10-CM | POA: Diagnosis not present

## 2016-10-22 DIAGNOSIS — I499 Cardiac arrhythmia, unspecified: Secondary | ICD-10-CM | POA: Diagnosis not present

## 2016-10-22 DIAGNOSIS — Z85850 Personal history of malignant neoplasm of thyroid: Secondary | ICD-10-CM | POA: Insufficient documentation

## 2016-10-22 LAB — CBC
HCT: 43.5 % (ref 39.0–52.0)
Hemoglobin: 14.7 g/dL (ref 13.0–17.0)
MCH: 30.6 pg (ref 26.0–34.0)
MCHC: 33.8 g/dL (ref 30.0–36.0)
MCV: 90.6 fL (ref 78.0–100.0)
Platelets: 144 10*3/uL — ABNORMAL LOW (ref 150–400)
RBC: 4.8 MIL/uL (ref 4.22–5.81)
RDW: 13.8 % (ref 11.5–15.5)
WBC: 7.2 10*3/uL (ref 4.0–10.5)

## 2016-10-22 LAB — BASIC METABOLIC PANEL
Anion gap: 12 (ref 5–15)
BUN: 20 mg/dL (ref 6–20)
CO2: 22 mmol/L (ref 22–32)
Calcium: 9.4 mg/dL (ref 8.9–10.3)
Chloride: 111 mmol/L (ref 101–111)
Creatinine, Ser: 1.09 mg/dL (ref 0.61–1.24)
GFR calc Af Amer: >60 mL/min (ref >60)
GFR calc non Af Amer: >60 mL/min (ref >60)
Glucose, Bld: 99 mg/dL (ref 65–99)
Potassium: 3.8 mmol/L (ref 3.5–5.1)
Sodium: 145 mmol/L (ref 135–145)

## 2016-10-22 LAB — I-STAT TROPONIN, ED: Troponin i, poc: 0.02 ng/mL (ref 0.00–0.08)

## 2016-10-22 NOTE — ED Notes (Signed)
Pt stable, ambulatory, states understanding of discharge instructions 

## 2016-10-22 NOTE — Discharge Instructions (Addendum)
  Your pacemaker was interrogated and appears to be working normally. Follow-up with your cardiologist or return for any concerns.

## 2016-10-22 NOTE — ED Provider Notes (Signed)
[facility] DEPT Provider Note   CSN: [id number] Arrival date & time: 10/22/16  1238     History   Chief Complaint Chief Complaint  Patient presents with  . Near Syncope    HPI Bradley [name] is a 61 y.o. male PMH combine systolic and diastolic heart failure s/p st. Jude pacemaker placement, s/p cadaveric aortic valve replacement, hypothyroidism, atrial fibrillation, presents after a near syncopal event. Says that his sister surprised him and came into town last night. He was feeling like his usual self this morning except for feeling tired because he hadn't gotten enough sleep last night. He and his family were sitting at a coffee shop after going for a long drive this morning. He began feeling nauseous and went up to the counter to ask for a glass of water. While waiting on the water he started gagging and became sweaty and lightheaded. His wife noticed that he appeared pale. His family helped him to the floor to lie down and he began feeling better as soon as he lay down.  EMS was called and found that he had a blood sugar of 87, BP 106.60, and an EKG with new LBBB.   He did not feel his pacemaker go off. He denies chest pain, palpitations, or SOB. At baseline he has shortness of breath when walking up one flight of stairs but no difficulty on flat land.  He has had similar episodes in the past when having blood drawn and with pacemaker manipulation, he was told that this was vagal in nature.  He also notes that he did not drink as much fluid as he usally does yesterday. At this time he is feeling tired but otherwise feels like his usual self.  HPI  Past Medical History:  Diagnosis Date  . Anxiety   . Aortic stenosis    s/p Bentall with bioprosthetic AVR 02/2010; Last echo (9/11): Moderate LVH, EF 45-50%, AVR functioning appropriately, aortic valve mean gradient 21, diastolic dysfunction. Chest MRA (2/13): Mild to moderate dilatation at the sinus of Valsalva at 4.1 cm, mild  dilatation ascending aorta distal to the tube graft at 3.9 cm, moderate dilatation of the innominate artery a 2.1 cm;    . Depression   . ESRD on dialysis [facility])    a. 09/2013 felt to be related to gentamycin b. no longer requiring HD  . Hx of cardiac cath    a. LHC in 02/2010: normal cors  . Hx of echocardiogram 02/2014   Echo (8/15):  Mod LV, EF 35-40%, Gr 1 DD, AVR ok (mean 14 mmHg), mild LAE  . Hypertension   . Hypothyroidism, postsurgical   . Prosthetic valve endocarditis ([place])   . Staphylococcus aureus bacteremia   . Thyroid cancer ([place])   . Ventricular tachycardia ([place])    a. s/p STJ ICD    Patient Active Problem List   Diagnosis Date Noted  . AICD (automatic cardioverter/defibrillator) present   . History of acute inferior wall MI 12/31/2014  . 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 CKD (chronic kidney disease), stage II   . H/O aortic valve repair- 1968 12/07/2013  . Non-ischemic cardiomyopathy- EF 20-25% - TTE 11/2013 12/07/2013  . Ventricular tachycardia   . Elevated troponin- chronic- normal coronaries 11/25/2013  . Chronic combined systolic and diastolic CHF (congestive heart failure) ([place]) 10/22/2013  . Major depressive disorder, recurrent, severe with psychotic features- hospitalized in Jan 2013 08/20/2011  . Generalized anxiety disorder 08/20/2011  . AVR- Bentall 2011, re-do  March 2015 secondary to valve endocarditis 12/29/2010  . Hypertension  12/29/2010  . Hypothyroidism 03/05/2009    Past Surgical History:  Procedure Laterality Date  . AORTIC VALVE REPAIR  1968  . AORTIC VALVE REPLACEMENT  2011  . AV FISTULA PLACEMENT Left 10/08/2013   Procedure: ARTERIOVENOUS (AV) FISTULA CREATION- LEFT ARM; Radial Cephalic ;  Surgeon: Mal [name], MD;  Location: [place];  Service: Vascular;  Laterality: Left;  . BENTALL PROCEDURE N/A 10/28/2013   Procedure: REDO BENTALL PROCEDURE, debridment of aoritc root abscess, replacement of aortic root, ascending aorta and aortic valve  with homograft. Insertion of left femoral arterial line;  Surgeon: [name], MD;  Location: [facility];  Service: Open Heart Surgery;  Laterality: N/A;  . CARDIAC CATHETERIZATION  03/02/2010   NORMAL CORONARY ARTERY  . CARDIAC CATHETERIZATION N/A 01/01/2015   Procedure: Coronary/Graft Angiography;  Surgeon: Troy Sine, MD;  Location: [place] CV LAB;  Service: Cardiovascular;  Laterality: N/A;  . CARDIAC VALVE REPLACEMENT    . EP IMPLANTABLE DEVICE N/A 01/01/2015   Procedure: ICD Implant;  Surgeon: Deboraha Sprang, MD;  Location: [place] CV LAB;  Service: Cardiovascular;  Laterality: N/A;  . INTRAOPERATIVE TRANSESOPHAGEAL ECHOCARDIOGRAM N/A 10/28/2013   Procedure: INTRAOPERATIVE TRANSESOPHAGEAL ECHOCARDIOGRAM;  Surgeon: [name], MD;  Location: [place];  Service: Open Heart Surgery;  Laterality: N/A;  . PERIPHERAL VASCULAR CATHETERIZATION N/A 01/01/2015   Procedure: Aortic Arch Angiography;  Surgeon: Troy Sine, MD;  Location: [place] CV LAB;  Service: Cardiovascular;  Laterality: N/A;  . SHOULDER ARTHROSCOPY W/ ROTATOR CUFF REPAIR Right 2012  . STERNOTOMY     REDO  . TEE WITHOUT CARDIOVERSION N/A 10/24/2013   Procedure: TRANSESOPHAGEAL ECHOCARDIOGRAM (TEE);  Surgeon: Dorothy Spark, MD;  Location: [facility] ENDOSCOPY;  Service: Cardiovascular;  Laterality: N/A;  . THYROIDECTOMY  ~ 2005  . TRANSTHORACIC ECHOCARDIOGRAM  03/2010   SHOWED MILD REDUCTION OF LV FUNCTION       Home Medications    Prior to Admission medications   Medication Sig Start Date End Date Taking? Authorizing Provider  aspirin EC 81 MG tablet Take 81 mg by mouth daily.     Historical Provider, MD  atorvastatin (LIPITOR) 40 MG tablet Take 40 mg by mouth every other day.    Historical Provider, MD  betamethasone valerate ointment (VALISONE) 0.1 % APPLY  OINTMENT TO AFFECTED AREA TWICE DAILY 09/21/15   Fay Records, MD  carvedilol (COREG) 6.25 MG tablet Take 6.25 mg by mouth 2 (two) times daily with a meal.     Historical Provider, MD  clonazePAM (KLONOPIN) 1 MG tablet TAKE ONE TABLET BY MOUTH TWICE DAILY AS NEEDED(NOT FOR REGULAR USE). 05/16/16   [name], MD  docusate sodium (STOOL SOFTENER) 100 MG capsule Take 100 mg by mouth 2 (two) times daily.    Historical Provider, MD  furosemide (LASIX) 20 MG tablet TAKE 1 TABLET EVERY MONDAY AND THURSDAY 07/25/16   Fay Records, MD  levothyroxine (SYNTHROID, LEVOTHROID) 150 MCG tablet TAKE ONE TABLET BY MOUTH ONCE DAILY BEFORE BREAKFAST 03/10/16   [name], MD  mexiletine (MEXITIL) 150 MG capsule TAKE 2 CAPSULES BY MOUTH EVERY MORNING AND TAKE 2 CAPSULES EVERY EVENING 09/08/16   Fay Records, MD  mexiletine (MEXITIL) 150 MG capsule TAKE 2 CAPSULES BY MOUTH EVERY MORNING AND TAKE 2 CAPSULES EVERY EVENING 10/02/16   Larey Dresser, MD  Neomycin-Bacitracin-Polymyxin (HCA TRIPLE ANTIBIOTIC OINTMENT EX) Apply 1 application topically  2 (two) times daily.  01/27/15   Historical Provider, MD  RANEXA 500 MG 12 hr tablet TAKE 1 TABLET BY MOUTH TWICE A DAY 10/02/16   Larey Dresser, MD  ranolazine (RANEXA) 500 MG 12 hr tablet Take 1 tablet (500 mg total) by mouth 2 (two) times daily. 09/08/16   Fay Records, MD  sacubitril-valsartan (ENTRESTO) 97-103 MG Take 1 tablet by mouth 2 (two) times daily. 07/26/16   Jolaine Artist, MD  sertraline (ZOLOFT) 50 MG tablet TAKE ONE TABLET BY MOUTH ONCE DAILY. 04/20/16   [name], MD    Family History Family History  Problem Relation Age of Onset  . Hypertension Mother   . Heart disease Father   . Stroke Father   . Hypertension Father   . Thyroid cancer Sister   . Thyroid cancer Sister   . Kidney Stones Sister   . Heart attack Neg Hx     Social History Social History  Substance Use Topics  . Smoking status: Never Smoker  . Smokeless tobacco: Never Used  . Alcohol use No     Comment: Occasional     Allergies   Oxycodone and Rifampin   Review of Systems Review of Systems  Constitutional:  Positive for fatigue.  Respiratory: Negative for shortness of breath.   Cardiovascular: Negative for chest pain, palpitations and leg swelling.  Skin: Positive for pallor.  Neurological: Positive for weakness.  All other systems reviewed and are negative.    Physical Exam Updated Vital Signs BP 127/86   Pulse 60   Temp 97.8 F (36.6 C) (Oral)   Resp 17   Ht 6\' 1"  (1.854 m)   Wt 87.5 kg   SpO2 100%   BMI 25.46 kg/m   Physical Exam  Constitutional: He is oriented to person, place, and time. He appears well-developed and well-nourished. No distress.  HENT:  Head: Normocephalic and atraumatic.  Eyes: Conjunctivae and EOM are normal. No scleral icterus.  Neck: Normal range of motion.  Cardiovascular: Normal rate and regular rhythm.   Murmur heard. 3/6 diastolic murmur   Pulmonary/Chest: Effort normal. No respiratory distress. He has no wheezes. He has no rales.  Neurological: He is alert and oriented to person, place, and time. No cranial nerve deficit.  Strength intact and equal in both upper and lower extremities   Skin: Skin is warm and dry. He is not diaphoretic.  Psychiatric: He has a normal mood and affect. His behavior is normal.     ED Treatments / Results  Labs (all labs ordered are listed, but only abnormal results are displayed) Labs Reviewed  CBC - Abnormal; Notable for the following:       Result Value   Platelets 144 (*)    All other components within normal limits  BASIC METABOLIC PANEL  URINALYSIS, ROUTINE W REFLEX MICROSCOPIC  I-STAT TROPOININ, ED  CBG MONITORING, ED    EKG  EKG Interpretation  Date/Time:  Sunday October 22 2016 12:46:23 EDT Ventricular Rate:  61 PR Interval:    QRS Duration: 131 QT Interval:  456 QTC Calculation: 460 R Axis:   36 Text Interpretation:  Sinus rhythm Borderline prolonged PR interval Left bundle branch block since previous tracing, T waves in inferolateral leads are now upright Confirmed by LITTLE MD, RACHEL  ([id number]) on 10/22/2016 12:48:58 PM       Radiology No results found.  Procedures Procedures (including critical care time)  Medications Ordered in ED Medications - No data to  display   Initial Impression / Assessment and Plan / ED Course  I have reviewed the triage vital signs and the nursing notes.  Pertinent labs & imaging results that were available during my care of the patient were reviewed by me and considered in my medical decision making (see chart for details).   61 year old man with PMH chronic combined systolic and diastolic dysfunction s/p st. Jude pacemaker, s/p mechanical aortic valve replacement and atrial fibrillation. Presents after a near syncopal episode. EKG shows LBBB which is consistent with prior and shows resolution of T wave inversion in inferolateral leads. Troponin is negative. BMP and CBC are reassuring. Will interrogate his pacemaker.   16:00 notified that the St. Jude pacemaker interrogation center has been having technical difficulties for the past day. The machine is showing that his data has been transmitted but we are unsure if they have received the data. CBC, CMP, and troponin all unremarkable.   16:30 Pacemaker interrogation has transmitted from our end but has not yet been received for interrogation. The system is still down and they are not sure when these results will be received. Consult placed to cardiology   Final Clinical Impressions(s) / ED Diagnoses   Final diagnoses:  None    New Prescriptions New Prescriptions   No medications on file     Ledell Noss, MD 10/22/16 [place], MD 10/22/16 [phone]

## 2016-10-22 NOTE — ED Triage Notes (Signed)
[facility]- pt coming from restaurant and had a near syncopal episode. Pt felt light headed like he was going to pass out. Pt has extensive cardiac hx. Pt turned yellow in color per family. Vitals stable with EMS. No complaints on arrival. Skin color WNL.

## 2016-10-22 NOTE — ED Provider Notes (Signed)
Pacemaker was interrogated and appears to be working normally. This was discussed with the patient and his wife. He is stable for discharge.   Nat Christen, MD 10/22/16 [phone]

## 2016-10-22 NOTE — ED Notes (Signed)
Attempted to interrogate pacemaker X2. Unable to get interrogation to transmit. MD aware. Spoke to [employer]. Jude help line who report system issues all day and will reboot the system and should receive information. Help line reports this could take as long as 24hrs.

## 2016-10-25 ENCOUNTER — Encounter: Payer: Self-pay | Admitting: Internal Medicine

## 2016-11-05 NOTE — Progress Notes (Signed)
Cardiology Office Note   Date:  11/06/2016   ID:  Bradley [name], DOB 12-27-1955, MRN [id number]  PCP:  [name], MD  Cardiologist:   Dorris Carnes, MD   F/U of chronic systolic CHF     History of Present Illness: Bradley [name] is a 61 y.o. male with a history of aortic stenosis requiring aortic valvulotomy at age 22 and subsequent Bentall procedure in 02/2000 with placement of a pericardial tissue valve. Cardiac catheterization 02/2010 demonstrated normal coronary arteries. Other history includes LBBB, thyroid cancer, status post thyroidectomy in 2005, surgical hypothyroidism, HTN, depression, chronically elevated troponins.  He was admitted in 08/2013 with MSSA bacteremia and bacterial endocarditis with evidence of cerebral emboli. He had worsening renal function (possibly from gentamicin) and was started on hemodialysis. 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 He was found to have a perivalvular abscess on TEE and cardiac CTA and ultimately underwent redo Bentall procedure with homograft and debridement of root abscess. He required amiodarone in the OR for VT/VF (required multiple shocks). He continued to require hemodialysis. He had a prolonged hospitalization and was d/c 11/13/13.  Admitted 02/2422 with a/c systolic CHF. Echo demonstrated worsening LVF with EF 20-25%. ACEI and beta blocker were started. Cardiac CTA prior to his redo Bentall demonstrated normal cors. Of note, TSH was over 50 and his synthroid was adjusted.  I saw the pt in Dec 2016 He has been seen since  by D Bensimhon since, the last time  in Dec 2017  He had a "spell" in March  Went to ED  Was out with family  Hadn't eaten but he had taken meds  Got up from sitting  Got woozy  Sat down  No syncope  Has not had other spells   Breathing is OK  No PND  No orthopnea  Wt at home 193  Stable  Tries to weigh every day   Current Outpatient Prescriptions  Medication Sig  Dispense Refill  . aspirin EC 81 MG tablet Take 81 mg by mouth daily.     Marland Kitchen atorvastatin (LIPITOR) 40 MG tablet Take 40 mg by mouth daily.     . betamethasone valerate ointment (VALISONE) 0.1 % APPLY  OINTMENT TO AFFECTED AREA TWICE DAILY 30 g 2  . carvedilol (COREG) 6.25 MG tablet Take 6.25 mg by mouth 2 (two) times daily with a meal.    . clonazePAM (KLONOPIN) 1 MG tablet TAKE ONE TABLET BY MOUTH TWICE DAILY AS NEEDED(NOT FOR REGULAR USE). 30 tablet 0  . docusate sodium (STOOL SOFTENER) 100 MG capsule Take 100 mg by mouth 2 (two) times daily.    . furosemide (LASIX) 20 MG tablet TAKE 1 TABLET EVERY MONDAY AND THURSDAY 25 tablet 3  . levothyroxine (SYNTHROID, LEVOTHROID) 150 MCG tablet TAKE ONE TABLET BY MOUTH ONCE DAILY BEFORE BREAKFAST 90 tablet 2  . mexiletine (MEXITIL) 150 MG capsule TAKE 2 CAPSULES BY MOUTH EVERY MORNING AND TAKE 2 CAPSULES EVERY EVENING 120 capsule 0  . Neomycin-Bacitracin-Polymyxin (HCA TRIPLE ANTIBIOTIC OINTMENT EX) Apply 1 application topically 2 (two) times daily.     . ranolazine (RANEXA) 500 MG 12 hr tablet Take 1 tablet (500 mg total) by mouth 2 (two) times daily. 60 tablet 0  . sacubitril-valsartan (ENTRESTO) 97-103 MG Take 1 tablet by mouth 2 (two) times daily. 60 tablet 6  . sertraline (ZOLOFT) 50 MG tablet TAKE ONE TABLET BY MOUTH ONCE DAILY 90 tablet 1   No current  facility-administered medications for this visit.     Allergies:   Oxycodone and Rifampin   Past Medical History:  Diagnosis Date  . Anxiety   . Aortic stenosis    s/p Bentall with bioprosthetic AVR 02/2010; Last echo (9/11): Moderate LVH, EF 45-50%, AVR functioning appropriately, aortic valve mean gradient 21, diastolic dysfunction. Chest MRA (2/13): Mild to moderate dilatation at the sinus of Valsalva at 4.1 cm, mild dilatation ascending aorta distal to the tube graft at 3.9 cm, moderate dilatation of the innominate artery a 2.1 cm;    . Depression   . ESRD on dialysis [facility])    a. 09/2013 felt  to be related to gentamycin b. no longer requiring HD  . Hx of cardiac cath    a. LHC in 02/2010: normal cors  . Hx of echocardiogram 02/2014   Echo (8/15):  Mod LV, EF 35-40%, Gr 1 DD, AVR ok (mean 14 mmHg), mild LAE  . Hypertension   . Hypothyroidism, postsurgical   . Prosthetic valve endocarditis ([place])   . Staphylococcus aureus bacteremia   . Thyroid cancer ([place])   . Ventricular tachycardia ([place])    a. s/p STJ ICD    Past Surgical History:  Procedure Laterality Date  . AORTIC VALVE REPAIR  1968  . AORTIC VALVE REPLACEMENT  2011  . AV FISTULA PLACEMENT Left 10/08/2013   Procedure: ARTERIOVENOUS (AV) FISTULA CREATION- LEFT ARM; Radial Cephalic ;  Surgeon: Mal [name], MD;  Location: [place];  Service: Vascular;  Laterality: Left;  . BENTALL PROCEDURE N/A 10/28/2013   Procedure: REDO BENTALL PROCEDURE, debridment of aoritc root abscess, replacement of aortic root, ascending aorta and aortic valve with homograft. Insertion of left femoral arterial line;  Surgeon: [name], MD;  Location: [place];  Service: Open Heart Surgery;  Laterality: N/A;  . CARDIAC CATHETERIZATION  03/02/2010   NORMAL CORONARY ARTERY  . CARDIAC CATHETERIZATION N/A 01/01/2015   Procedure: Coronary/Graft Angiography;  Surgeon: Troy Sine, MD;  Location: [place] CV LAB;  Service: Cardiovascular;  Laterality: N/A;  . CARDIAC VALVE REPLACEMENT    . EP IMPLANTABLE DEVICE N/A 01/01/2015   Procedure: ICD Implant;  Surgeon: Deboraha Sprang, MD;  Location: [place] CV LAB;  Service: Cardiovascular;  Laterality: N/A;  . INTRAOPERATIVE TRANSESOPHAGEAL ECHOCARDIOGRAM N/A 10/28/2013   Procedure: INTRAOPERATIVE TRANSESOPHAGEAL ECHOCARDIOGRAM;  Surgeon: [name], MD;  Location: [place];  Service: Open Heart Surgery;  Laterality: N/A;  . PERIPHERAL VASCULAR CATHETERIZATION N/A 01/01/2015   Procedure: Aortic Arch Angiography;  Surgeon: Troy Sine, MD;  Location: [place] CV LAB;  Service: Cardiovascular;   Laterality: N/A;  . SHOULDER ARTHROSCOPY W/ ROTATOR CUFF REPAIR Right 2012  . STERNOTOMY     REDO  . TEE WITHOUT CARDIOVERSION N/A 10/24/2013   Procedure: TRANSESOPHAGEAL ECHOCARDIOGRAM (TEE);  Surgeon: Dorothy Spark, MD;  Location: [facility] ENDOSCOPY;  Service: Cardiovascular;  Laterality: N/A;  . THYROIDECTOMY  ~ 2005  . TRANSTHORACIC ECHOCARDIOGRAM  03/2010   SHOWED MILD REDUCTION OF LV FUNCTION     Social History:  The patient  reports that he has never smoked. He has never used smokeless tobacco. He reports that he does not drink alcohol or use drugs.   Family History:  The patient's family history includes Heart disease in his father; Hypertension in his father and mother; Kidney Stones in his sister; Stroke in his father; Thyroid cancer in his sister and sister.    ROS:  Please see the history of  present illness. All other systems are reviewed and  Negative to the above problem except as noted.    PHYSICAL EXAM: VS:  BP 126/78   Pulse 62   Ht 6\' 1"  (1.854 m)   Wt 195 lb 12.8 oz (88.8 kg)   SpO2 99%   BMI 25.83 kg/m   GEN: Well nourished, well developed, in no acute distress HEENT: normal Neck: no JVD, carotid bruits, or masses Cardiac: RRR; no murmurs, rubs, or gallops,no edema  Respiratory:  clear to auscultation bilaterally, normal work of breathing GI: soft, nontender, nondistended, + BS  No hepatomegaly  MS: no deformity Moving all extremities   Skin: warm and dry, no rash Neuro:  Strength and sensation are intact Psych: euthymic mood, full affect   EKG:  EKG is not ordered today.   Lipid Panel    Component Value Date/Time   CHOL 182 03/06/2016 1218   TRIG 116.0 03/06/2016 1218   HDL 42.40 03/06/2016 1218   CHOLHDL 4 03/06/2016 1218   VLDL 23.2 03/06/2016 1218   LDLCALC 116 (H) 03/06/2016 1218      Wt Readings from Last 3 Encounters:  11/06/16 195 lb 12.8 oz (88.8 kg)  10/22/16 193 lb (87.5 kg)  07/26/16 200 lb 8 oz (90.9 kg)      ASSESSMENT AND  PLAN:   1  Chronic systolic CHF   Volume triv up  Will check BMET  Refill lasix to take couple times per wk  Appt with DB later this spring with echo    2  VT/VF  Has ICD  Keep on Ranexa  3  HL  Will get lipids today    4  Thyrod  Check TSH    I will plan to f/u in clinic in the fall  Pt has appt with D Bensimhon in May  Will have echo prior    Signed, Dorris Carnes, MD  11/06/2016 11:09 AM    [address], [place], [place]  [address] Phone: [phone]; Fax: [phone]

## 2016-11-06 ENCOUNTER — Ambulatory Visit (INDEPENDENT_AMBULATORY_CARE_PROVIDER_SITE_OTHER): Payer: Medicare HMO | Admitting: Internal Medicine

## 2016-11-06 ENCOUNTER — Encounter: Payer: Self-pay | Admitting: Internal Medicine

## 2016-11-06 VITALS — BP 126/78 | HR 62 | Ht 73.0 in | Wt 195.8 lb

## 2016-11-06 DIAGNOSIS — I5042 Chronic combined systolic (congestive) and diastolic (congestive) heart failure: Secondary | ICD-10-CM

## 2016-11-06 NOTE — Patient Instructions (Signed)
Your physician recommends that you continue on your current medications as directed. Please refer to the Current Medication list given to you today.  Your physician recommends that you return for lab work today (TSH, LIPDS)  Your physician wants you to follow-up in: [place].  You will receive a reminder letter in the mail two months in advance. If you don't receive a letter, please call our office to schedule the follow-up appointment.

## 2016-11-07 LAB — LIPID PANEL
Chol/HDL Ratio: 2.5 ratio (ref 0.0–5.0)
Cholesterol, Total: 144 mg/dL (ref 100–199)
HDL: 57 mg/dL
LDL Calculated: 76 mg/dL (ref 0–99)
Triglycerides: 57 mg/dL (ref 0–149)
VLDL Cholesterol Cal: 11 mg/dL (ref 5–40)

## 2016-11-07 LAB — TSH: TSH: 8.95 u[IU]/mL — ABNORMAL HIGH (ref 0.450–4.500)

## 2016-11-09 ENCOUNTER — Other Ambulatory Visit: Payer: Self-pay | Admitting: *Deleted

## 2016-11-09 MED ORDER — LEVOTHYROXINE SODIUM 175 MCG PO TABS: 175.0000 ug | ORAL_TABLET | Freq: Every day | ORAL | 1 refills | Status: DC

## 2016-11-14 ENCOUNTER — Telehealth: Payer: Self-pay

## 2016-11-14 ENCOUNTER — Ambulatory Visit (INDEPENDENT_AMBULATORY_CARE_PROVIDER_SITE_OTHER): Payer: Medicare HMO | Admitting: *Deleted

## 2016-11-14 DIAGNOSIS — I5042 Chronic combined systolic (congestive) and diastolic (congestive) heart failure: Secondary | ICD-10-CM

## 2016-11-14 DIAGNOSIS — I428 Other cardiomyopathies: Secondary | ICD-10-CM | POA: Diagnosis not present

## 2016-11-14 DIAGNOSIS — Z95810 Presence of automatic (implantable) cardiac defibrillator: Secondary | ICD-10-CM

## 2016-11-14 NOTE — Progress Notes (Signed)
Remote ICD transmission.   

## 2016-11-14 NOTE — Addendum Note (Signed)
Addended by: [name] on: 11/14/2016 03:46 PM   Modules accepted: Level of Service

## 2016-11-14 NOTE — Progress Notes (Signed)
EPIC Encounter for ICM Monitoring  Patient Name: Bradley [name] is a 61 y.o. male Date: 11/14/2016 Primary Care Physican: [name], MD Primary Cardiologist:Ross/Bensimhon Electrophysiologist: Caryl Comes Nephrologist:Deterding Dry Weight:unknown       Attempted call to patient and unable to reach.   Transmission reviewed.   Patient had ER visit 10/22/2016 for near syncopal episode   Thoracic impedance stopped recording approximately in March.  Will contact [place] representative to review.   Current prescribed dose of Furosemide 20 mg 1 tablet every Monday and Thursday.   Labs: 08/09/2016 Creatinine 1.19, BUN 19, Potassium 3.9, Sodium 142, EGFR > 60 07/26/2016 Creatinine 1.13, BUN 17, Potassium 5.5 (H), Sodium 140, EGFR > 60 04/24/2016 Creatinine 1.29, BUN 16, Potassium 5.8, Sodium 139 03/06/2016 Creatinine 1.40, BUN 19, Potassium 5.4, Sodium 140, EGFR 55.2  12/06/2015 Creatinine 1.34, BUN 17, Potassium 5.2, Sodium 140  10/14/2015 Creatinine 1.28, BUN 22, Potassium 4.5, Sodium 140  12/12/2016Creatinine 1.21, BUN 28, Potassium 5.2, Sodium 139   Recommendations: NONE - Unable to reach patient   Follow-up plan: ICM clinic phone appointment on 12/15/2016.  Office appointment scheduled on 12/21/2016 with HF clinic.  Copy of ICM check sent to device physician.   3 month ICM trend: 11/14/2016        [name], RN 11/14/2016 3:33 PM

## 2016-11-14 NOTE — Telephone Encounter (Signed)
Remote ICM transmission received.  Attempted patient call and no answering machine

## 2016-11-16 LAB — CUP PACEART REMOTE DEVICE CHECK
Battery Remaining Longevity: 76 mo
Battery Remaining Percentage: 74 %
Battery Voltage: 2.96 V
Brady Statistic RV Percent Paced: 1 %
Date Time Interrogation Session: 20180417060018
HighPow Impedance: 65 Ohm
HighPow Impedance: 65 Ohm
Implantable Lead Implant Date: 20160603
Implantable Lead Location: 753860
Implantable Lead Model: 7122
Implantable Pulse Generator Implant Date: 20160603
Lead Channel Impedance Value: 400 Ohm
Lead Channel Pacing Threshold Amplitude: 1 V
Lead Channel Pacing Threshold Pulse Width: 0.5 ms
Lead Channel Sensing Intrinsic Amplitude: 11.7 mV
Lead Channel Setting Pacing Amplitude: 2.5 V
Lead Channel Setting Pacing Pulse Width: 0.5 ms
Lead Channel Setting Sensing Sensitivity: 0.5 mV
Pulse Gen Serial Number: 7135169

## 2016-11-17 ENCOUNTER — Encounter: Payer: Self-pay | Admitting: Cardiology

## 2016-12-01 ENCOUNTER — Other Ambulatory Visit: Payer: Self-pay | Admitting: Cardiology

## 2016-12-02 ENCOUNTER — Other Ambulatory Visit: Payer: Self-pay | Admitting: Cardiology

## 2016-12-05 ENCOUNTER — Encounter: Payer: Self-pay | Admitting: Cardiology

## 2016-12-15 ENCOUNTER — Ambulatory Visit (INDEPENDENT_AMBULATORY_CARE_PROVIDER_SITE_OTHER): Payer: Medicare HMO

## 2016-12-15 ENCOUNTER — Telehealth: Payer: Self-pay

## 2016-12-15 DIAGNOSIS — I5042 Chronic combined systolic (congestive) and diastolic (congestive) heart failure: Secondary | ICD-10-CM

## 2016-12-15 DIAGNOSIS — Z95810 Presence of automatic (implantable) cardiac defibrillator: Secondary | ICD-10-CM

## 2016-12-15 NOTE — Progress Notes (Signed)
EPIC Encounter for ICM Monitoring  Patient Name: Bradley [name] is a 61 y.o. male Date: 12/15/2016 Primary Care Physican: [name], MD Primary Cardiologist:Ross/Bensimhon Electrophysiologist: Caryl Comes Nephrologist:Deterding Dry Weight:unknown        Attempted call to patient and unable to reach.   Transmission reviewed.    Thoracic impedance normal since he resumed recording on 12/09/2016.  Current prescribed dose of Furosemide 20 mg 1 tablet every Monday and Thursday.   Labs: 08/09/2016 Creatinine 1.19, BUN 19, Potassium 3.9, Sodium 142, EGFR > 60 07/26/2016 Creatinine 1.13, BUN 17, Potassium 5.5 (H), Sodium 140, EGFR > 60 04/24/2016 Creatinine 1.29, BUN 16, Potassium 5.8, Sodium 139 03/06/2016 Creatinine 1.40, BUN 19, Potassium 5.4, Sodium 140, EGFR 55.2  12/06/2015 Creatinine 1.34, BUN 17, Potassium 5.2, Sodium 140  10/14/2015 Creatinine 1.28, BUN 22, Potassium 4.5, Sodium 140  12/12/2016Creatinine 1.21, BUN 28, Potassium 5.2, Sodium 139   NONE - Unable to reach patient   Follow-up plan: ICM clinic phone appointment on 01/15/2017.  Office appointment scheduled on 12/21/2016 with Dr Haroldine Laws.  Copy of ICM check sent to device physician.   3 month ICM trend: 12/15/2016   1 Year ICM trend:      [name], RN 12/15/2016 7:44 AM

## 2016-12-15 NOTE — Telephone Encounter (Signed)
Remote ICM transmission received.  Attempted patient call and lno answer

## 2016-12-18 ENCOUNTER — Other Ambulatory Visit: Payer: Self-pay | Admitting: Family Medicine

## 2016-12-18 NOTE — Telephone Encounter (Signed)
Last refill 05/16/16 and last office visit 03/06/16.  Okay to fill?

## 2016-12-18 NOTE — Telephone Encounter (Signed)
Would avoid prn benzo.   Set up office follow up if need to discuss.

## 2016-12-21 ENCOUNTER — Ambulatory Visit (HOSPITAL_BASED_OUTPATIENT_CLINIC_OR_DEPARTMENT_OTHER)
Admission: RE | Admit: 2016-12-21 | Discharge: 2016-12-21 | Disposition: A | Discharge status: Home / Self Care | Payer: Medicare HMO | Source: Ambulatory Visit | Attending: Internal Medicine | Admitting: Internal Medicine

## 2016-12-21 ENCOUNTER — Ambulatory Visit (HOSPITAL_COMMUNITY)
Admission: RE | Admit: 2016-12-21 | Discharge: 2016-12-21 | Disposition: A | Discharge status: Home / Self Care | Payer: Medicare HMO | Source: Ambulatory Visit | Attending: Internal Medicine | Admitting: Internal Medicine

## 2016-12-21 ENCOUNTER — Encounter (HOSPITAL_COMMUNITY): Payer: Self-pay | Admitting: Internal Medicine

## 2016-12-21 VITALS — BP 146/84 | HR 69 | Wt 193.0 lb

## 2016-12-21 DIAGNOSIS — I132 Hypertensive heart and chronic kidney disease with heart failure and with stage 5 chronic kidney disease, or end stage renal disease: Secondary | ICD-10-CM | POA: Insufficient documentation

## 2016-12-21 DIAGNOSIS — I472 Ventricular tachycardia, unspecified: Secondary | ICD-10-CM

## 2016-12-21 DIAGNOSIS — I5022 Chronic systolic (congestive) heart failure: Secondary | ICD-10-CM | POA: Insufficient documentation

## 2016-12-21 DIAGNOSIS — I5042 Chronic combined systolic (congestive) and diastolic (congestive) heart failure: Secondary | ICD-10-CM

## 2016-12-21 DIAGNOSIS — I10 Essential (primary) hypertension: Secondary | ICD-10-CM | POA: Diagnosis not present

## 2016-12-21 DIAGNOSIS — Z7982 Long term (current) use of aspirin: Secondary | ICD-10-CM | POA: Insufficient documentation

## 2016-12-21 MED ORDER — DIGOXIN 125 MCG PO TABS: 125.0000 ug | ORAL_TABLET | Freq: Every day | ORAL | 11 refills | Status: DC

## 2016-12-21 MED ORDER — SPIRONOLACTONE 25 MG PO TABS: 12.5000 mg | ORAL_TABLET | Freq: Every day | ORAL | 3 refills | Status: DC

## 2016-12-21 NOTE — Progress Notes (Signed)
Patient ID: Bradley [name], male   DOB: 04-13-1956, 61 y.o.   MRN: [id number]    Advanced Heart Failure Clinic Note   Referring Physician: Chanetta Marshall, NP Primary Care: Dr [name] Primary Cardiologist: Dr Dorris Carnes Primary HF: Dr. Haroldine Laws  HPI: Bradley [name] is a 61 y.o. male with a complicated past medical history including thyroid cancer (s/p thyroidectomy), hypertension, depression, and congenital aortic stenosis for which he underwent aortic vavulotomy at age 16 with subsequent Bentall procedure in 2001 with a pericardial tissue valve. He had MSSA bacteremia and bacterial endocarditis in 07/4429 complicated by renal failure requiring dialysis for a short period of time. 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   He was admitted 11/2014 with progressive episodes of palpitations and pre-syncope. He was found to have monomorphic ventricular tachycardia. Catheterization demonstrated normal coronaries. Echocardiogram demonstrated EF 30-35% and he underwent STJ dual chamber ICD implant. Mexiletine was added prior to discharge and his Metoprolol was changed to Coreg. He had subsequent ventricular arrhythmias and his Mexelitine was increased with no further VT.  CPX test performed 10/25/15 showed a moderate functional impairment (pVO2 16.1). Resting spirometry reveals mild obstruction/restriction. There was also evidence of chronotropic incompetence.   Today he returns for HF follow up. In April levothyroxine was increased. Overall feeling just ok. Complaining of fatigue. SOB with exertion and steps. Drinking 2-3 bottles of gatorade everyday. Weight at home 193 pounds. Appetite ok. No ICD shocks. Taking all medications.   ICD interrogated personally: No VT. Volume status looks good.   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 Lives at home  with wife and 3/4 of his kids. 2 sons and 2 daughters.   CPX 10/25/15 FVC 3.47 (66%) FEV1 2.86 (71%) Resting HR 60 Peak 112 Resting BP 130/80 Peak 190/88 Peak V02 16.1 (55.2%) VQ/MG86 slope: 35 OUES: 1.69 Peak RER 1.13 Ventilatory threshold 10.9 (37.4%) Peak RR 40 Peak Ventilation 72.2 VE/MVV 51% PETCO2 at peak: 31 O2 pulse 13 (81%)  Echo 04/2013 LVEF 60-65% Echo 08/2013 LVEF 45-50% Echo 11/2013 LVEF 20-25% Echo 02/2014 LVEF 35-40% Echo 12/30/14 LVEF 30-35%, Mild MR, Mod LAE, RV mildly dilated, Mild RAE Echo 5/17 25-30%  ECHO 12/21/2016 LVEF 25-30%   Past Medical History:  Diagnosis Date  . Anxiety   . Aortic stenosis    s/p Bentall with bioprosthetic AVR 02/2010; Last echo (9/11): Moderate LVH, EF 45-50%, AVR functioning appropriately, aortic valve mean gradient 21, diastolic dysfunction. Chest MRA (2/13): Mild to moderate dilatation at the sinus of Valsalva at 4.1 cm, mild dilatation ascending aorta distal to the tube graft at 3.9 cm, moderate dilatation of the innominate artery a 2.1 cm;    . Depression   . ESRD on dialysis [facility])    a. 09/2013 felt to be related to gentamycin b. no longer requiring HD  . Hx of cardiac cath    a. LHC in 02/2010: normal cors  . Hx of echocardiogram 02/2014   Echo (8/15):  Mod LV, EF 35-40%, Gr 1 DD, AVR ok (mean 14 mmHg), mild LAE  . Hypertension   . Hypothyroidism, postsurgical   . Prosthetic valve endocarditis ([place])   . Staphylococcus aureus bacteremia   . Thyroid cancer ([place])   . Ventricular tachycardia ([place])    a. s/p STJ ICD    Current Outpatient Prescriptions  Medication Sig Dispense Refill  . aspirin EC 81  MG tablet Take 81 mg by mouth daily.     Marland Kitchen atorvastatin (LIPITOR) 40 MG tablet Take 40 mg by mouth daily.     . betamethasone valerate ointment (VALISONE) 0.1 % APPLY  OINTMENT TO AFFECTED AREA TWICE DAILY 30 g 2  . carvedilol (COREG) 6.25 MG tablet Take 6.25 mg by mouth 2 (two) times daily with a meal.    . clonazePAM (KLONOPIN) 1  MG tablet TAKE ONE TABLET BY MOUTH TWICE DAILY AS NEEDED(NOT FOR REGULAR USE). 30 tablet 0  . docusate sodium (STOOL SOFTENER) 100 MG capsule Take 100 mg by mouth 2 (two) times daily.    . furosemide (LASIX) 20 MG tablet TAKE 1 TABLET EVERY MONDAY AND THURSDAY 25 tablet 3  . levothyroxine (SYNTHROID, LEVOTHROID) 175 MCG tablet Take 1 tablet (175 mcg total) by mouth daily before breakfast. 90 tablet 1  . mexiletine (MEXITIL) 150 MG capsule TAKE 2 CAPSULES BY MOUTH EVERY MORNING AND TAKE 2 CAPSULES EVERY EVENING 120 capsule 11  . Neomycin-Bacitracin-Polymyxin (HCA TRIPLE ANTIBIOTIC OINTMENT EX) Apply 1 application topically 2 (two) times daily.     Marland Kitchen RANEXA 500 MG 12 hr tablet TAKE 1 TABLET BY MOUTH TWICE A DAY 60 tablet 11  . RANEXA 500 MG 12 hr tablet TAKE 1 TABLET BY MOUTH TWICE A DAY 60 tablet 6  . sacubitril-valsartan (ENTRESTO) 97-103 MG Take 1 tablet by mouth 2 (two) times daily. 60 tablet 6  . sertraline (ZOLOFT) 50 MG tablet TAKE ONE TABLET BY MOUTH ONCE DAILY 90 tablet 1   No current facility-administered medications for this encounter.     Allergies  Allergen Reactions  . Oxycodone Other (See Comments)    Gives patient nightmares  . Rifampin Nausea Only      Social History   Social History  . Marital status: Married    Spouse name: N/A  . Number of children: N/A  . Years of education: N/A   Occupational History  . Not on file.   Social History Main Topics  . Smoking status: Never Smoker  . Smokeless tobacco: Never Used  . Alcohol use No     Comment: Occasional  . Drug use: No  . Sexual activity: No   Other Topics Concern  . Not on file   Social History Narrative   Lives at home with walker.  Does not drive.  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 Hypertension Mother   . Heart disease Father   . Stroke Father   . Hypertension Father   . Thyroid cancer Sister   . Thyroid cancer Sister   .  Kidney Stones Sister   . Heart attack Neg Hx     Vitals:   12/21/16 1209  BP: (!) 146/84  Pulse: 69  SpO2: 100%  Weight: 193 lb (87.5 kg)   Wt Readings from Last 3 Encounters:  12/21/16 193 lb (87.5 kg)  11/06/16 195 lb 12.8 oz (88.8 kg)  10/22/16 193 lb (87.5 kg)     PHYSICAL EXAM: General:  Well appearing. No resp difficulty HEENT: normal Neck: supple. no JVD. Carotids 2+ bilat; no bruits. No lymphadenopathy or thryomegaly appreciated. Cor: PMI nondisplaced. Regular rate & rhythm. No rubs, gallops or murmurs. Lungs: clear Abdomen: soft, nontender, nondistended. No hepatosplenomegaly. No bruits or masses. Good bowel sounds. Extremities: no cyanosis, clubbing, rash, edema Neuro: alert & orientedx3, cranial nerves grossly intact. moves  all 4 extremities w/o difficulty. Affect pleasant  . ASSESSMENT & PLAN:  1. Chronic systolic HF - NICM St Jude  Echo 5/17 LVEF 25-30%, Mild MR, Mod LAE, RV mildly dilated, Mild RAE. ECHO 12/21/2016 LVEF 25-30%. IW AK RV mild-mod  -March 2017 CPX with moderate HF limitation. Set up repeat CPX.  - NYHA II-III. Continue lasix at current dose.  - Add 12.5 mg spiro daily  - On goal dose Entresto to 97/103.  - Continue coreg 6.25 mg BID.  - Add digoxin 0.125 mg daily.   2. VT/NICM - On mexilitene/Ranexa - No VT/VF on ICD.  3. HTN - Meds as above.  4. Valvular heart disease s/p bentall in 2001 and re-do bentall 2015 5. Depression  BMET 10 days. Follow up in 6 weeks to discuss results.   Follow up 6 weeks.   Darrick Grinder, NP-C  12:12 PM   Patient seen and examined with Darrick Grinder, NP. We discussed all aspects of the encounter. I agree with the assessment and plan as stated above.   Still having NYHA III symptoms but objectively seems to be improving. BP up. Agree with med changes as above. Will get repeat CPX test to objectively re-evaluate HF trajectory. Volume status looks good. VT quiescent. ICD interrogated personally.   Glori Bickers,  MD  8:34 PM

## 2016-12-21 NOTE — Patient Instructions (Signed)
Start Spironolactone 12.5mg  daily.  Start Digoxin 0.174mcg daily.  Labs on 01/01/2017 at 10:00am  CPX on 01/01/2017 at 11:00am   Follow up Thursday June 21st at 10:20 am

## 2016-12-21 NOTE — Progress Notes (Signed)
  Echocardiogram 2D Echocardiogram has been performed.  Bradley [name] 12/21/2016, 12:00 PM

## 2017-01-01 ENCOUNTER — Ambulatory Visit (HOSPITAL_COMMUNITY)
Admission: RE | Admit: 2017-01-01 | Discharge: 2017-01-01 | Disposition: A | Discharge status: Home / Self Care | Payer: Medicare HMO | Source: Ambulatory Visit | Attending: Cardiology | Admitting: Cardiology

## 2017-01-01 ENCOUNTER — Other Ambulatory Visit (HOSPITAL_COMMUNITY): Payer: Self-pay | Admitting: *Deleted

## 2017-01-01 ENCOUNTER — Ambulatory Visit (HOSPITAL_COMMUNITY): Payer: Medicare HMO

## 2017-01-01 DIAGNOSIS — I5042 Chronic combined systolic (congestive) and diastolic (congestive) heart failure: Secondary | ICD-10-CM

## 2017-01-01 LAB — BASIC METABOLIC PANEL
Anion gap: 9 (ref 5–15)
BUN: 22 mg/dL — ABNORMAL HIGH (ref 6–20)
CO2: 25 mmol/L (ref 22–32)
Calcium: 9.5 mg/dL (ref 8.9–10.3)
Chloride: 109 mmol/L (ref 101–111)
Creatinine, Ser: 1.39 mg/dL — ABNORMAL HIGH (ref 0.61–1.24)
GFR calc Af Amer: >60 mL/min (ref >60)
GFR calc non Af Amer: 54 mL/min — ABNORMAL LOW (ref >60)
Glucose, Bld: 117 mg/dL — ABNORMAL HIGH (ref 65–99)
Potassium: 3.6 mmol/L (ref 3.5–5.1)
Sodium: 143 mmol/L (ref 135–145)

## 2017-01-11 ENCOUNTER — Encounter: Payer: Self-pay | Admitting: Family Medicine

## 2017-01-15 ENCOUNTER — Ambulatory Visit (INDEPENDENT_AMBULATORY_CARE_PROVIDER_SITE_OTHER): Payer: Medicare HMO

## 2017-01-15 DIAGNOSIS — Z95810 Presence of automatic (implantable) cardiac defibrillator: Secondary | ICD-10-CM

## 2017-01-15 DIAGNOSIS — I5042 Chronic combined systolic (congestive) and diastolic (congestive) heart failure: Secondary | ICD-10-CM

## 2017-01-15 NOTE — Progress Notes (Signed)
EPIC Encounter for ICM Monitoring  Patient Name: Bradley [name] is a 61 y.o. male Date: 01/15/2017 Primary Care Physican: [name], MD Primary Cardiologist:Ross/Bensimhon Electrophysiologist: Caryl Comes Nephrologist:Deterding Dry Weight:unknown       Attempted call to wife and unable to reach.  Left message to return call.  Transmission reviewed.    Thoracic impedance normal but was abnormal suggesting fluid accumulation from 01/04/2017 to 01/12/2017.  Current prescribed dose of Furosemide 20 mg 1 tablet every Monday and Thursday.   Labs: 08/09/2016 Creatinine 1.19, BUN 19, Potassium 3.9, Sodium 142, EGFR > 60 07/26/2016 Creatinine 1.13, BUN 17, Potassium 5.5 (H), Sodium 140, EGFR > 60 04/24/2016 Creatinine 1.29, BUN 16, Potassium 5.8, Sodium 139 03/06/2016 Creatinine 1.40, BUN 19, Potassium 5.4, Sodium 140, EGFR 55.2  12/06/2015 Creatinine 1.34, BUN 17, Potassium 5.2, Sodium 140  10/14/2015 Creatinine 1.28, BUN 22, Potassium 4.5, Sodium 140  12/12/2016Creatinine 1.21, BUN 28, Potassium 5.2, Sodium 139   Recommendations:  NONE - Unable to reach patient   Follow-up plan: ICM clinic phone appointment on 02/15/2017.  Office appointment scheduled on 01/18/2017 with Dr. Haroldine Laws.  Copy of ICM check sent to cardiologist and device physician.   3 month ICM trend: 01/15/2017   1 Year ICM trend:      [name], RN 01/15/2017 9:08 AM

## 2017-01-16 ENCOUNTER — Telehealth: Payer: Self-pay | Admitting: *Deleted

## 2017-01-16 MED ORDER — ENSURE PO LIQD: 237.0000 mL | Freq: Every day | ORAL | 12 refills | Status: DC | PRN

## 2017-01-16 NOTE — Telephone Encounter (Signed)
OK to use Ensure as needed.

## 2017-01-16 NOTE — Telephone Encounter (Signed)
rx faxed

## 2017-01-16 NOTE — Telephone Encounter (Signed)
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  Okay to fill?  Fax [phone]

## 2017-01-18 ENCOUNTER — Ambulatory Visit (HOSPITAL_COMMUNITY)
Admission: RE | Admit: 2017-01-18 | Discharge: 2017-01-18 | Disposition: A | Discharge status: Home / Self Care | Payer: Medicare HMO | Source: Ambulatory Visit | Attending: Internal Medicine | Admitting: Internal Medicine

## 2017-01-18 ENCOUNTER — Encounter (HOSPITAL_COMMUNITY): Payer: Self-pay | Admitting: Internal Medicine

## 2017-01-18 VITALS — BP 150/92 | HR 58 | Wt 195.0 lb

## 2017-01-18 DIAGNOSIS — B9689 Other specified bacterial agents as the cause of diseases classified elsewhere: Secondary | ICD-10-CM | POA: Diagnosis not present

## 2017-01-18 DIAGNOSIS — I132 Hypertensive heart and chronic kidney disease with heart failure and with stage 5 chronic kidney disease, or end stage renal disease: Secondary | ICD-10-CM | POA: Diagnosis not present

## 2017-01-18 DIAGNOSIS — Z953 Presence of xenogenic heart valve: Secondary | ICD-10-CM | POA: Insufficient documentation

## 2017-01-18 DIAGNOSIS — F419 Anxiety disorder, unspecified: Secondary | ICD-10-CM | POA: Diagnosis not present

## 2017-01-18 DIAGNOSIS — Z85850 Personal history of malignant neoplasm of thyroid: Secondary | ICD-10-CM | POA: Insufficient documentation

## 2017-01-18 DIAGNOSIS — N186 End stage renal disease: Secondary | ICD-10-CM | POA: Insufficient documentation

## 2017-01-18 DIAGNOSIS — Z992 Dependence on renal dialysis: Secondary | ICD-10-CM | POA: Insufficient documentation

## 2017-01-18 DIAGNOSIS — I5022 Chronic systolic (congestive) heart failure: Secondary | ICD-10-CM | POA: Diagnosis not present

## 2017-01-18 DIAGNOSIS — Z808 Family history of malignant neoplasm of other organs or systems: Secondary | ICD-10-CM | POA: Diagnosis not present

## 2017-01-18 DIAGNOSIS — I472 Ventricular tachycardia: Secondary | ICD-10-CM | POA: Diagnosis not present

## 2017-01-18 DIAGNOSIS — F329 Major depressive disorder, single episode, unspecified: Secondary | ICD-10-CM | POA: Insufficient documentation

## 2017-01-18 DIAGNOSIS — Q230 Congenital stenosis of aortic valve: Secondary | ICD-10-CM | POA: Insufficient documentation

## 2017-01-18 DIAGNOSIS — I959 Hypotension, unspecified: Secondary | ICD-10-CM | POA: Diagnosis not present

## 2017-01-18 DIAGNOSIS — E890 Postprocedural hypothyroidism: Secondary | ICD-10-CM | POA: Diagnosis not present

## 2017-01-18 DIAGNOSIS — Z7982 Long term (current) use of aspirin: Secondary | ICD-10-CM | POA: Diagnosis not present

## 2017-01-18 NOTE — Progress Notes (Signed)
Patient ID: Bradley [name], male   DOB: 1956-01-10, 61 y.o.   MRN: [id number]    Advanced Heart Failure Clinic Note   Referring Physician: Chanetta Marshall, NP Primary Care: Dr [name] Primary Cardiologist: Dr Dorris Carnes Primary HF: Dr. Haroldine Laws  HPI: Bradley [name] is a 61 y.o. male with a complicated past medical history including thyroid cancer (s/p thyroidectomy), hypertension, depression, and congenital aortic stenosis for which he underwent aortic vavulotomy at age 50 with subsequent Bentall procedure in 2001 with a pericardial tissue valve. He had MSSA bacteremia and bacterial endocarditis in 01/9023 complicated by renal failure requiring dialysis for a short period of time. 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   He was admitted 11/2014 with progressive episodes of palpitations and pre-syncope. He was found to have monomorphic ventricular tachycardia. Catheterization demonstrated normal coronaries. Echocardiogram demonstrated EF 30-35% and he underwent STJ dual chamber ICD implant. Mexiletine was added prior to discharge and his Metoprolol was changed to Coreg. He had subsequent ventricular arrhythmias and his Mexelitine was increased with no further VT.  CPX test performed 10/25/15 showed a moderate functional impairment (pVO2 16.1). Resting spirometry reveals mild obstruction/restriction. There was also evidence of chronotropic incompetence.   Today he returns for HF follow up. Recently feeling fatigued. CPX performed on 01/01/17 showed pVO2 21.1 with slope 35 (about 35% improvement from before). Still feels fatigued. Working in his shop. No orthopnea or PND +bendopnea. Weight stable. Some dizziness with standing. BP normal at home. Had to stop spiro due to low BP.   ICD interrogated personally: No VT. Volume status looks good.   Social:  Retired from [employer] social research officer, has a farm. Lives at home with wife and 3/4 of his kids. 2 sons and 2 daughters.   CPX 01/01/17  FEV1 3.40 (86%)     FEV1/FVC 83 (107%)     MVV 163 (108%)     Exercise Time:  11:30  Watts: 132  Resting HR: 72 Peak HR: 129  (80% age predicted max HR) BP rest: 118/70 BP peak: 174/84  Peak VO2: 21.1 (73% predicted peak VO2) VE/VCO2 slope: 35 OUES: 1.96 Peak RER: 1.22 Ventilatory Threshold: 16.6 (58% predicted or measured peak VO2) VE/MVV: 50% O2pulse: 15  (100% predicted O2pulse)  CPX 10/25/15 FVC 3.47 (66%) FEV1 2.86 (71%) Resting HR 60 Peak 112 Resting BP 130/80 Peak 190/88 Peak V02 16.1 (55.2%) OX/BD53 slope: 35 OUES: 1.69 Peak RER 1.13 Ventilatory threshold 10.9 (37.4%) Peak RR 40 Peak Ventilation 72.2 VE/MVV 51% PETCO2 at peak: 31 O2 pulse 13 (81%)  Echo 04/2013 LVEF 60-65% Echo 08/2013 LVEF 45-50% Echo 11/2013 LVEF 20-25% Echo 02/2014 LVEF 35-40% Echo 12/30/14 LVEF 30-35%, Mild MR, Mod LAE, RV mildly dilated, Mild RAE Echo 5/17 25-30%  ECHO 12/21/2016 LVEF 25-30%   Past Medical History:  Diagnosis Date  . Anxiety   . Aortic stenosis    s/p Bentall with bioprosthetic AVR 02/2010; Last echo (9/11): Moderate LVH, EF 45-50%, AVR functioning appropriately, aortic valve mean gradient 21, diastolic dysfunction. Chest MRA (2/13): Mild to moderate dilatation at the sinus of Valsalva at 4.1 cm, mild dilatation ascending aorta distal to the tube graft at 3.9 cm, moderate dilatation of the innominate artery a 2.1 cm;    . Depression   . ESRD on dialysis [facility])    a. 09/2013 felt to be related to gentamycin b. no longer requiring HD  .  Hx of cardiac cath    a. LHC in 02/2010: normal cors  . Hx of echocardiogram 02/2014   Echo (8/15):  Mod LV, EF 35-40%, Gr 1 DD, AVR ok (mean 14 mmHg), mild LAE  . Hypertension   . Hypothyroidism, postsurgical   . Prosthetic valve endocarditis ([place])   . Staphylococcus aureus bacteremia   . Thyroid  cancer ([place])   . Ventricular tachycardia ([place])    a. s/p STJ ICD    Current Outpatient Prescriptions  Medication Sig Dispense Refill  . aspirin EC 81 MG tablet Take 81 mg by mouth daily.     Marland Kitchen atorvastatin (LIPITOR) 40 MG tablet Take 40 mg by mouth daily.     . betamethasone valerate ointment (VALISONE) 0.1 % APPLY  OINTMENT TO AFFECTED AREA TWICE DAILY 30 g 2  . carvedilol (COREG) 6.25 MG tablet Take 6.25 mg by mouth 2 (two) times daily with a meal.    . clonazePAM (KLONOPIN) 1 MG tablet TAKE ONE TABLET BY MOUTH TWICE DAILY AS NEEDED(NOT FOR REGULAR USE). 30 tablet 0  . digoxin (LANOXIN) 0.125 MG tablet Take 1 tablet (125 mcg total) by mouth daily. 30 tablet 11  . docusate sodium (STOOL SOFTENER) 100 MG capsule Take 100 mg by mouth 2 (two) times daily.    Marland Kitchen ENSURE (ENSURE) Take 237 mLs by mouth daily as needed. 237 mL 12  . furosemide (LASIX) 20 MG tablet TAKE 1 TABLET EVERY MONDAY AND THURSDAY 25 tablet 3  . levothyroxine (SYNTHROID, LEVOTHROID) 175 MCG tablet Take 1 tablet (175 mcg total) by mouth daily before breakfast. 90 tablet 1  . mexiletine (MEXITIL) 150 MG capsule TAKE 2 CAPSULES BY MOUTH EVERY MORNING AND TAKE 2 CAPSULES EVERY EVENING 120 capsule 11  . Neomycin-Bacitracin-Polymyxin (HCA TRIPLE ANTIBIOTIC OINTMENT EX) Apply 1 application topically 2 (two) times daily.     Marland Kitchen RANEXA 500 MG 12 hr tablet TAKE 1 TABLET BY MOUTH TWICE A DAY 60 tablet 11  . sacubitril-valsartan (ENTRESTO) 24-26 MG Take 1 tablet by mouth 2 (two) times daily.    . sertraline (ZOLOFT) 50 MG tablet TAKE ONE TABLET BY MOUTH ONCE DAILY 90 tablet 1  . spironolactone (ALDACTONE) 25 MG tablet Take 0.5 tablets (12.5 mg total) by mouth daily. 15 tablet 3   No current facility-administered medications for this encounter.     Allergies  Allergen Reactions  . Oxycodone Other (See Comments)    Gives patient nightmares  . Rifampin Nausea Only      Social History   Social History  . Marital status: Married     Spouse name: N/A  . Number of children: N/A  . Years of education: N/A   Occupational History  . Not on file.   Social History Main Topics  . Smoking status: Never Smoker  . Smokeless tobacco: Never Used  . Alcohol use No     Comment: Occasional  . Drug use: No  . Sexual activity: No   Other Topics Concern  . Not on file   Social History Narrative   Lives at home with walker.  Does not drive.  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 Hypertension Mother   . Heart disease Father   . Stroke Father   . Hypertension Father   . Thyroid cancer Sister   . Thyroid cancer Sister   . Kidney Stones Sister   . Heart  attack Neg Hx     Vitals:   01/18/17 1043  BP: (!) 150/92  Pulse: (!) 58  SpO2: 100%  Weight: 195 lb (88.5 kg)   Wt Readings from Last 3 Encounters:  01/18/17 195 lb (88.5 kg)  12/21/16 193 lb (87.5 kg)  11/06/16 195 lb 12.8 oz (88.8 kg)     PHYSICAL EXAM: General:  Well appearing. No resp difficulty HEENT: normal Neck: supple. no JVD. Carotids 2+ bilat; no bruits. No lymphadenopathy or thryomegaly appreciated. Cor: PMI laterally displaced. Regular rate & rhythm. No rubs, gallops or murmurs. Lungs: clear with decreased sounds throughout  Abdomen: soft, nontender, nondistended. No hepatosplenomegaly. No bruits or masses. Good bowel sounds. Extremities: no cyanosis, clubbing, rash, edema Neuro: alert & orientedx3, cranial nerves grossly intact. moves all 4 extremities w/o difficulty. Affect pleasant  . ASSESSMENT & PLAN:  1. Chronic systolic HF - NICM St Jude  Echo 5/17 LVEF 25-30%, Mild MR, Mod LAE, RV mildly dilated, Mild RAE. ECHO 12/21/2016 LVEF 25-30%. IW AK RV mild-mod  - Remains NYHA II-III - March 2017 CPX with moderate HF limitation.  - CPX 6/18 much improved - we reviewed personally  - Had to stop spiro due to low BP - On goal dose Entresto to 97/103.  - Continue coreg 6.25 mg BID.    - Continue digoxin 0.125 mg daily. Check level next visit  2. VT/NICM - On mexilitene/Ranexa - No VT/VF on ICD. Interrogated personally.  3. HTN - BP elevated here but is well controlled at home. 4. Valvular heart disease - s/p bentall in 2001 and re-do bentall 2015 - reinforced need for SBE with all procedures and dental cleanings 5. Depression   Cary Lothrop, MD 10:56 AM

## 2017-01-18 NOTE — Addendum Note (Signed)
Encounter addended by: Scarlette Calico, RN on: 01/18/2017 11:10 AM<BR>    Actions taken: Sign clinical note

## 2017-01-18 NOTE — Patient Instructions (Signed)
We will contact you in 3 months to schedule your next appointment.  

## 2017-01-28 DIAGNOSIS — I5042 Chronic combined systolic (congestive) and diastolic (congestive) heart failure: Secondary | ICD-10-CM | POA: Diagnosis not present

## 2017-01-29 ENCOUNTER — Other Ambulatory Visit: Payer: Self-pay | Admitting: Family Medicine

## 2017-01-29 NOTE — Telephone Encounter (Signed)
Last refill 05/16/16 and last office visit 03/06/16.  Okay to fill?

## 2017-01-29 NOTE — Telephone Encounter (Signed)
Refill once 

## 2017-02-05 ENCOUNTER — Other Ambulatory Visit: Payer: Self-pay

## 2017-02-15 ENCOUNTER — Telehealth: Payer: Self-pay

## 2017-02-15 ENCOUNTER — Ambulatory Visit (INDEPENDENT_AMBULATORY_CARE_PROVIDER_SITE_OTHER): Payer: Medicare HMO | Admitting: *Deleted

## 2017-02-15 DIAGNOSIS — Z95810 Presence of automatic (implantable) cardiac defibrillator: Secondary | ICD-10-CM | POA: Diagnosis not present

## 2017-02-15 DIAGNOSIS — I428 Other cardiomyopathies: Secondary | ICD-10-CM

## 2017-02-15 DIAGNOSIS — I5042 Chronic combined systolic (congestive) and diastolic (congestive) heart failure: Secondary | ICD-10-CM

## 2017-02-15 LAB — CUP PACEART REMOTE DEVICE CHECK
Battery Remaining Longevity: 74 mo
Battery Remaining Percentage: 72 %
Battery Voltage: 2.96 V
Brady Statistic RV Percent Paced: 1 %
Date Time Interrogation Session: 20180719120503
HighPow Impedance: 73 Ohm
HighPow Impedance: 73 Ohm
Implantable Lead Implant Date: 20160603
Implantable Lead Location: 753860
Implantable Lead Model: 7122
Implantable Pulse Generator Implant Date: 20160603
Lead Channel Impedance Value: 380 Ohm
Lead Channel Pacing Threshold Amplitude: 1 V
Lead Channel Pacing Threshold Pulse Width: 0.5 ms
Lead Channel Sensing Intrinsic Amplitude: 11.7 mV
Lead Channel Setting Pacing Amplitude: 2.5 V
Lead Channel Setting Pacing Pulse Width: 0.5 ms
Lead Channel Setting Sensing Sensitivity: 0.5 mV
Pulse Gen Serial Number: 7135169

## 2017-02-15 NOTE — Progress Notes (Signed)
ICD remote transmission received

## 2017-02-15 NOTE — Progress Notes (Signed)
EPIC Encounter for ICM Monitoring  Patient Name: Bradley [name] is a 61 y.o. male Date: 02/15/2017 Primary Care Physican: [name], MD Primary Cardiologist:Ross/Bensimhon Electrophysiologist: Caryl Comes Nephrologist:Deterding Dry Weight:unknown              Attempted call to patient/wife and unable to reach.  Transmission reviewed.    Thoracic impedance normal but was abnormal suggesting fluid accumulation from 01/04/2017 to 01/16/2017.  Prescribed dosage: Furosemide 20 mg 1 tablet every Monday and Thursday.   Labs: 01/01/2017 Creatinine 1.39, BUN 22, Potassium 3.6, Sodium 143, EGFR 54->60 10/22/2016 Creatinine 1.09, BUN 20, Potassium 3.8, Sodium 145, EGFR >60  10/04/2016 Creatinine 1.23, BUN 20, Potassium 4.0, Sodium 140 08/09/2016 Creatinine 1.19, BUN 19, Potassium 3.9, Sodium 142, EGFR > 60 07/26/2016 Creatinine 1.13, BUN 17, Potassium 5.5, Sodium 140, EGFR > 60 04/24/2016 Creatinine 1.29, BUN 16, Potassium 5.8, Sodium 139 03/06/2016 Creatinine 1.40, BUN 19, Potassium 5.4, Sodium 140, EGFR 55.2  12/06/2015 Creatinine 1.34, BUN 17, Potassium 5.2, Sodium 140  10/14/2015 Creatinine 1.28, BUN 22, Potassium 4.5, Sodium 140  12/12/2016Creatinine 1.21, BUN 28, Potassium 5.2, Sodium 139   Recommendations: NONE - Unable to reach patient   Follow-up plan: ICM clinic phone appointment on 03/20/2017.   Copy of ICM check sent to device physician.   3 month ICM trend: 02/15/2017   1 Year ICM trend:      [name], RN 02/15/2017 11:49 AM

## 2017-02-15 NOTE — Telephone Encounter (Signed)
Remote ICM transmission received.  Attempted patient call and no message

## 2017-02-22 ENCOUNTER — Encounter: Payer: Self-pay | Admitting: Cardiology

## 2017-02-25 ENCOUNTER — Other Ambulatory Visit (HOSPITAL_COMMUNITY): Payer: Self-pay | Admitting: Internal Medicine

## 2017-02-28 DIAGNOSIS — I5042 Chronic combined systolic (congestive) and diastolic (congestive) heart failure: Secondary | ICD-10-CM | POA: Diagnosis not present

## 2017-03-08 ENCOUNTER — Encounter: Payer: Self-pay | Admitting: Cardiology

## 2017-03-17 IMAGING — CR DG CHEST 2V
2 series · 2 of 2 positions shown · non-contrast
Comparison: Radiograph 12/29/2014

CLINICAL DATA: Status post internal cardiac defibrillator placement

EXAM:
CHEST  2 VIEW

[chest pa]
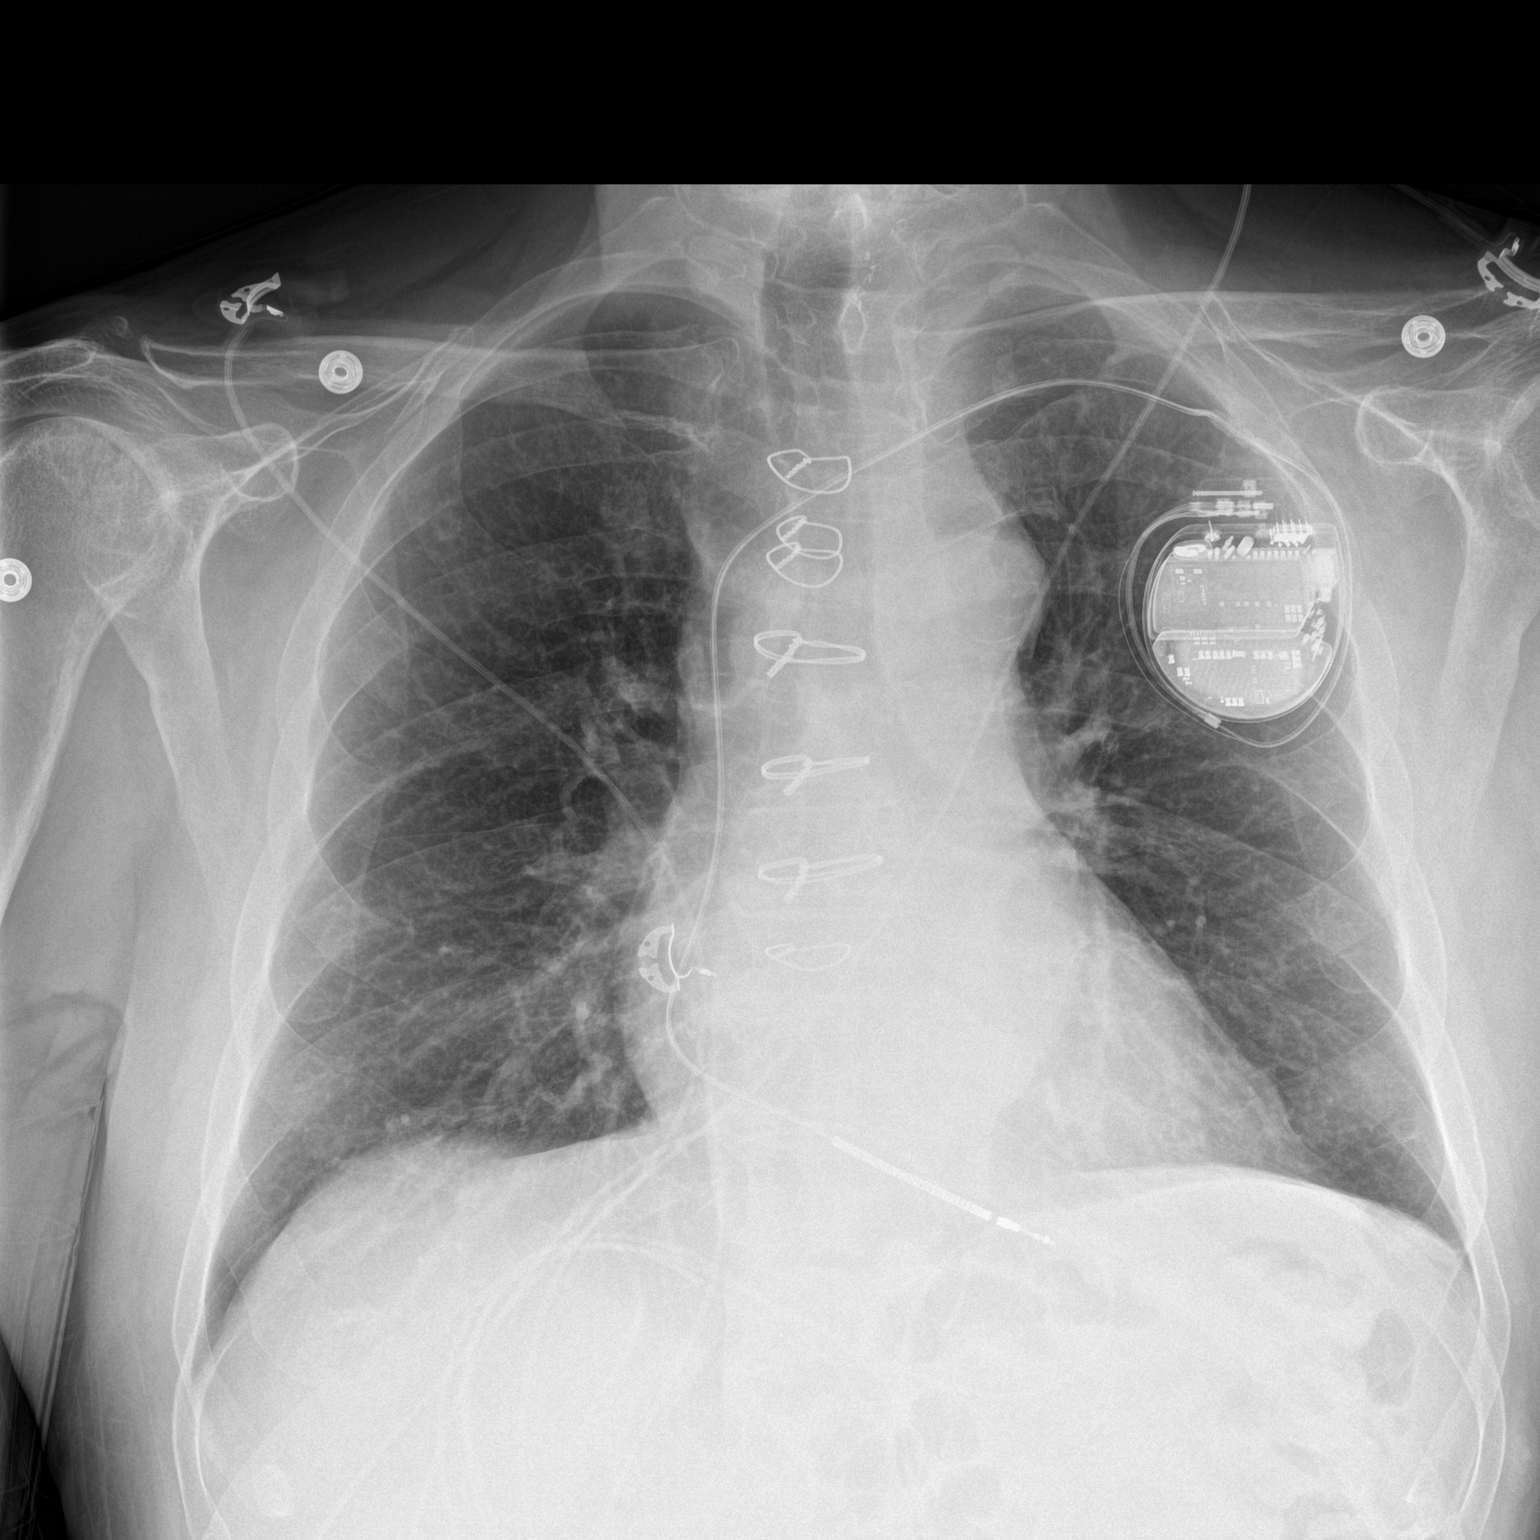

[chest lat]
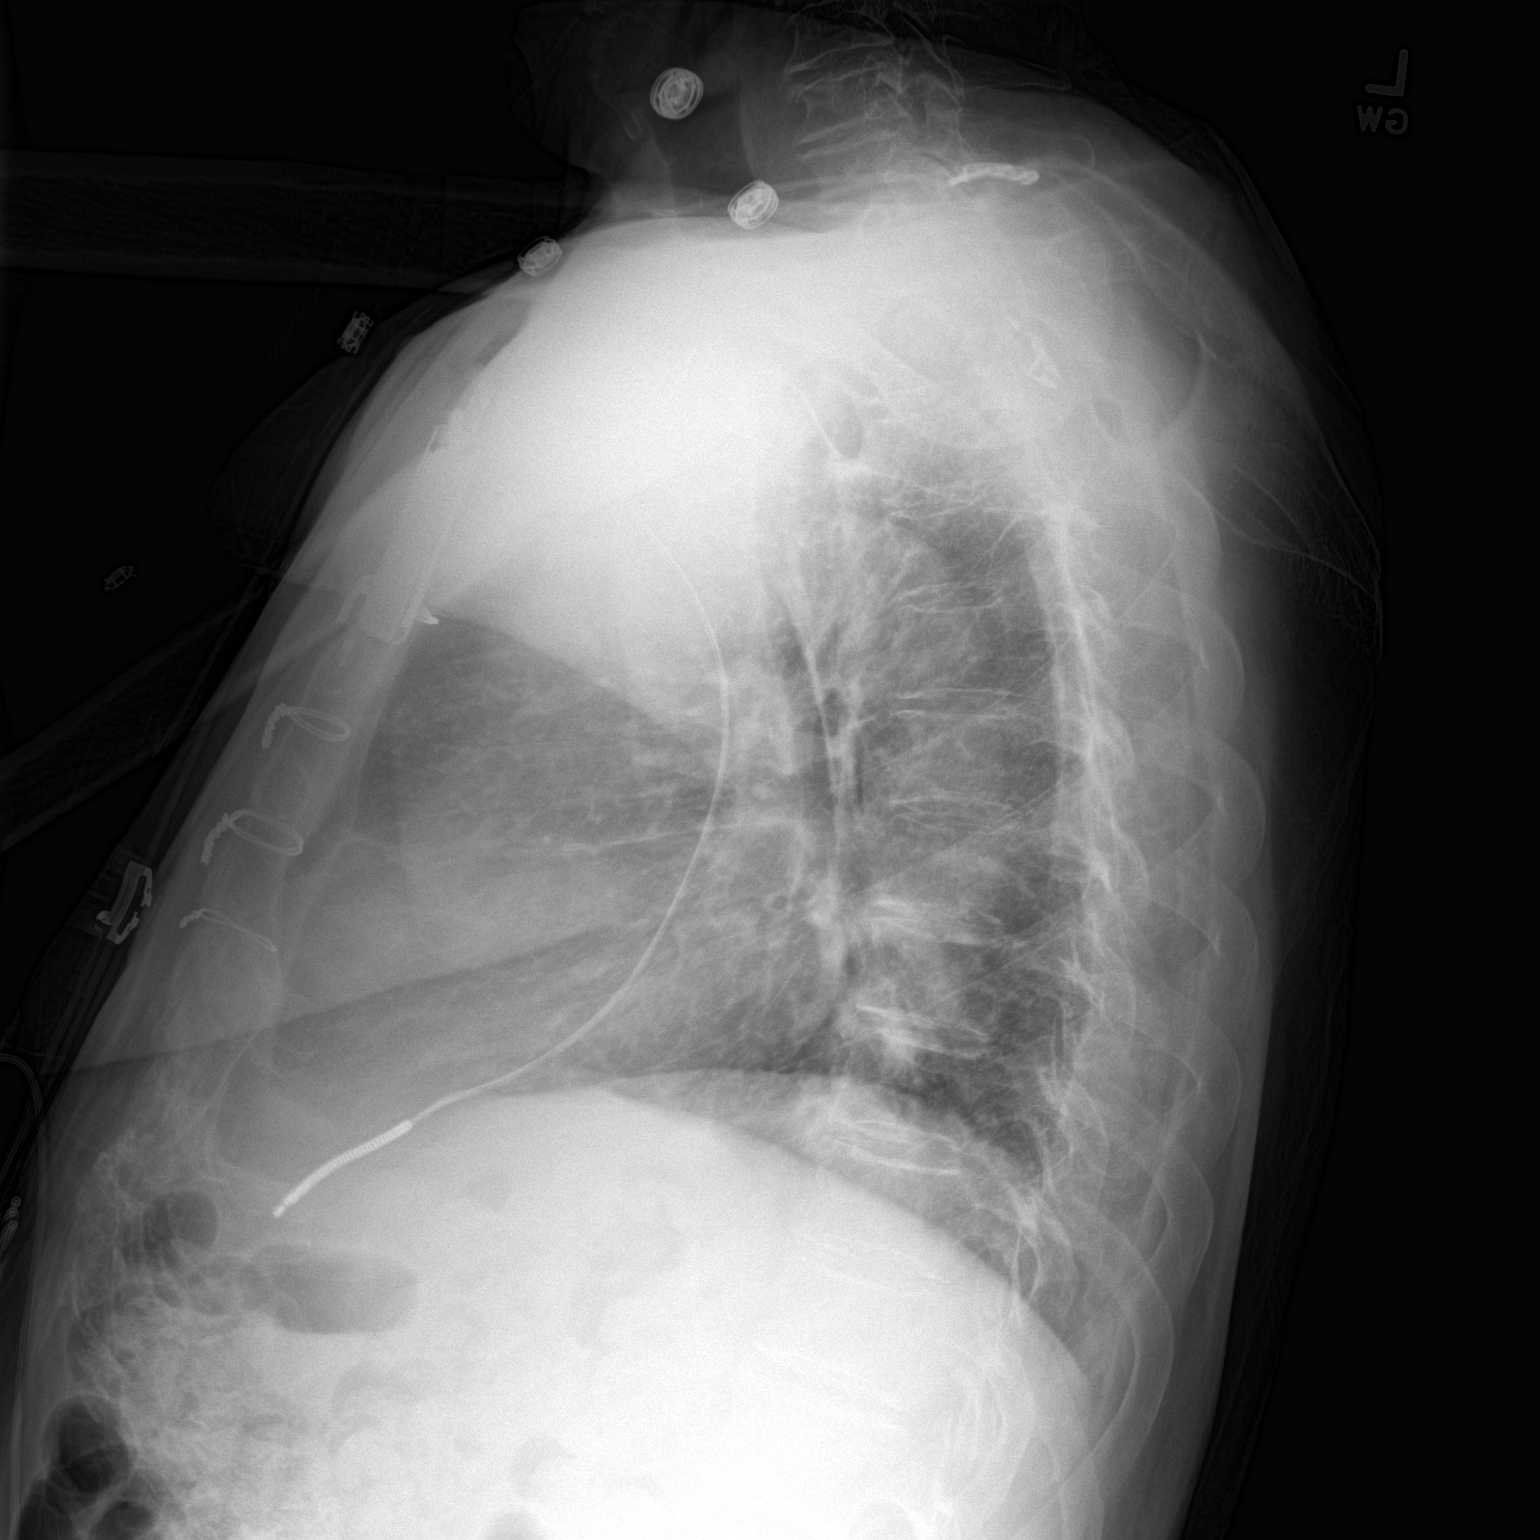

[2 of 2 positions shown; findings below may reference images not displayed]

FINDINGS: Left-sided ICD with single continuously overlies normal cardiac
silhouette. Midline sternotomy noted. No pneumothorax. No pulmonary
edema or pleural fluid.
IMPRESSION: No complication following left ICD placement.

## 2017-03-20 ENCOUNTER — Ambulatory Visit (INDEPENDENT_AMBULATORY_CARE_PROVIDER_SITE_OTHER): Payer: Medicare HMO

## 2017-03-20 ENCOUNTER — Telehealth: Payer: Self-pay

## 2017-03-20 DIAGNOSIS — Z95810 Presence of automatic (implantable) cardiac defibrillator: Secondary | ICD-10-CM

## 2017-03-20 DIAGNOSIS — I5042 Chronic combined systolic (congestive) and diastolic (congestive) heart failure: Secondary | ICD-10-CM

## 2017-03-20 NOTE — Progress Notes (Signed)
EPIC Encounter for ICM Monitoring  Patient Name: Bradley [name] is a 62 y.o. male Date: 03/20/2017 Primary Care Physican: [name], MD Primary Cardiologist:Ross/Bensimhon Electrophysiologist: Caryl Comes Nephrologist:Deterding Dry Weight:unknown        Attempted call to patient and unable to reach.  Left detailed message regarding transmission.  Transmission reviewed.    Thoracic impedance abnormal suggesting fluid accumulation from 03/10/2017 until today it is at baseline.  Prescribed dosage: Furosemide 20 mg 1 tablet every Monday and Thursday.   Labs: 01/01/2017 Creatinine 1.39, BUN 22, Potassium 3.6, Sodium 143, EGFR 54->60 10/22/2016 Creatinine 1.09, BUN 20, Potassium 3.8, Sodium 145, EGFR >60  10/04/2016 Creatinine 1.23, BUN 20, Potassium 4.0, Sodium 140 08/09/2016 Creatinine 1.19, BUN 19, Potassium 3.9, Sodium 142, EGFR > 60 07/26/2016 Creatinine 1.13, BUN 17, Potassium 5.5, Sodium 140, EGFR > 60 04/24/2016 Creatinine 1.29, BUN 16, Potassium 5.8, Sodium 139 03/06/2016 Creatinine 1.40, BUN 19, Potassium 5.4, Sodium 140, EGFR 55.2  12/06/2015 Creatinine 1.34, BUN 17, Potassium 5.2, Sodium 140  10/14/2015 Creatinine 1.28, BUN 22, Potassium 4.5, Sodium 140  12/12/2016Creatinine 1.21, BUN 28, Potassium 5.2, Sodium 139   Recommendations: Left voice mail with ICM number and encouraged to call for fluid symptoms.  Follow-up plan: ICM clinic phone appointment on 04/20/2017.   Copy of ICM check sent to Dr Haroldine Laws and Dr Caryl Comes.   3 month ICM trend: 03/20/2017   1 Year ICM trend:      [name], RN 03/20/2017 9:10 AM

## 2017-03-20 NOTE — Telephone Encounter (Signed)
Remote ICM transmission received.  Attempted patient call and left detailed message regarding transmission and next ICM scheduled for 04/20/2017.  Advised to return call for any fluid symptoms or questions.

## 2017-03-21 ENCOUNTER — Encounter: Payer: Self-pay | Admitting: Family Medicine

## 2017-03-21 ENCOUNTER — Ambulatory Visit (INDEPENDENT_AMBULATORY_CARE_PROVIDER_SITE_OTHER): Payer: Medicare HMO | Admitting: Family Medicine

## 2017-03-21 VITALS — BP 110/80 | HR 66 | Temp 98.3°F | Ht 73.0 in | Wt 193.8 lb

## 2017-03-21 DIAGNOSIS — E039 Hypothyroidism, unspecified: Secondary | ICD-10-CM

## 2017-03-21 DIAGNOSIS — Z23 Encounter for immunization: Secondary | ICD-10-CM | POA: Diagnosis not present

## 2017-03-21 DIAGNOSIS — I10 Essential (primary) hypertension: Secondary | ICD-10-CM | POA: Diagnosis not present

## 2017-03-21 DIAGNOSIS — Z0000 Encounter for general adult medical examination without abnormal findings: Secondary | ICD-10-CM

## 2017-03-21 LAB — BASIC METABOLIC PANEL
BUN: 18 mg/dL (ref 6–23)
CO2: 29 mEq/L (ref 19–32)
Calcium: 8.9 mg/dL (ref 8.4–10.5)
Chloride: 105 mEq/L (ref 96–112)
Creatinine, Ser: 1.27 mg/dL (ref 0.40–1.50)
GFR: 61.46 mL/min (ref >60.00)
Glucose, Bld: 104 mg/dL — ABNORMAL HIGH (ref 70–99)
Potassium: 3.9 mEq/L (ref 3.5–5.1)
Sodium: 139 mEq/L (ref 135–145)

## 2017-03-21 MED ORDER — SERTRALINE HCL 50 MG PO TABS: 50.0000 mg | ORAL_TABLET | Freq: Every day | ORAL | 3 refills | Status: DC

## 2017-03-21 NOTE — Progress Notes (Signed)
Subjective:     Patient ID: Bradley [name], male   DOB: [birth year], 61 y.o.   MRN: [id number]  HPI Patient is here for physical exam.  Multiple chronic problems including history of nonischemic cardiomyopathy, chronic kidney disease, chronic combined systolic and diastolic heart failure, recurrent depression, hypothyroidism.  Elevated TSH and cardiology increased his thyroid medication to 175 g. TSH was over 8. He is due for follow-up at this time. He had recent basic metabolic panel with creatinine slightly elevated 1.39 which appears to be slightly above his baseline. He has some chronic dyspnea with activity which is stable. Followed through congestive heart failure clinic.  Cologuard last year normal. No history of shingles vaccine.  Past Medical History:  Diagnosis Date  . Anxiety   . Aortic stenosis    s/p Bentall with bioprosthetic AVR 02/2010; Last echo (9/11): Moderate LVH, EF 45-50%, AVR functioning appropriately, aortic valve mean gradient 21, diastolic dysfunction. Chest MRA (2/13): Mild to moderate dilatation at the sinus of Valsalva at 4.1 cm, mild dilatation ascending aorta distal to the tube graft at 3.9 cm, moderate dilatation of the innominate artery a 2.1 cm;    . Depression   . ESRD on dialysis [facility])    a. 09/2013 felt to be related to gentamycin b. no longer requiring HD  . Hx of cardiac cath    a. LHC in 02/2010: normal cors  . Hx of echocardiogram 02/2014   Echo (8/15):  Mod LV, EF 35-40%, Gr 1 DD, AVR ok (mean 14 mmHg), mild LAE  . Hypertension   . Hypothyroidism, postsurgical   . Prosthetic valve endocarditis ([place])   . Staphylococcus aureus bacteremia   . Thyroid cancer ([place])   . Ventricular tachycardia ([place])    a. s/p STJ ICD   Past Surgical History:  Procedure Laterality Date  . AORTIC VALVE REPAIR  1968  . AORTIC VALVE REPLACEMENT  2011  . AV FISTULA PLACEMENT Left 10/08/2013   Procedure: ARTERIOVENOUS (AV) FISTULA CREATION- LEFT ARM; Radial Cephalic ;   Surgeon: Mal [name], MD;  Location: [place];  Service: Vascular;  Laterality: Left;  . BENTALL PROCEDURE N/A 10/28/2013   Procedure: REDO BENTALL PROCEDURE, debridment of aoritc root abscess, replacement of aortic root, ascending aorta and aortic valve with homograft. Insertion of left femoral arterial line;  Surgeon: [name], MD;  Location: [place];  Service: Open Heart Surgery;  Laterality: N/A;  . CARDIAC CATHETERIZATION  03/02/2010   NORMAL CORONARY ARTERY  . CARDIAC CATHETERIZATION N/A 01/01/2015   Procedure: Coronary/Graft Angiography;  Surgeon: Troy Sine, MD;  Location: [place] CV [facility];  Service: Cardiovascular;  Laterality: N/A;  . CARDIAC VALVE REPLACEMENT    . EP IMPLANTABLE DEVICE N/A 01/01/2015   Procedure: ICD Implant;  Surgeon: Deboraha Sprang, MD;  Location: [place] [facility];  Service: Cardiovascular;  Laterality: N/A;  . INTRAOPERATIVE TRANSESOPHAGEAL ECHOCARDIOGRAM N/A 10/28/2013   Procedure: INTRAOPERATIVE TRANSESOPHAGEAL ECHOCARDIOGRAM;  Surgeon: [name], MD;  Location: [place];  Service: Open Heart Surgery;  Laterality: N/A;  . PERIPHERAL VASCULAR CATHETERIZATION N/A 01/01/2015   Procedure: Aortic Arch Angiography;  Surgeon: Troy Sine, MD;  Location: [place] [facility];  Service: Cardiovascular;  Laterality: N/A;  . SHOULDER ARTHROSCOPY W/ ROTATOR CUFF REPAIR Right 2012  . STERNOTOMY     REDO  . TEE WITHOUT CARDIOVERSION N/A 10/24/2013   Procedure: TRANSESOPHAGEAL ECHOCARDIOGRAM (TEE);  Surgeon: Dorothy Spark, MD;  Location: [place];  Service: Cardiovascular;  Laterality: N/A;  . THYROIDECTOMY  ~ 2005  . TRANSTHORACIC ECHOCARDIOGRAM  03/2010   SHOWED MILD REDUCTION OF LV FUNCTION    reports that he has never smoked. He has never used smokeless tobacco. He reports that he does not drink alcohol or use drugs. family history includes Heart disease in his father; Hypertension in his father and mother; Kidney Stones in his sister; Stroke in his  father; Thyroid cancer in his sister and sister. Allergies  Allergen Reactions  . Oxycodone Other (See Comments)    Gives patient nightmares  . Rifampin Nausea Only     Review of Systems  Constitutional: Positive for fatigue. Negative for activity change, appetite change, chills and fever.  HENT: Negative for congestion, ear pain and trouble swallowing.   Eyes: Negative for pain and visual disturbance.  Respiratory: Positive for shortness of breath. Negative for cough and wheezing.   Cardiovascular: Negative for chest pain and palpitations.  Gastrointestinal: Negative for abdominal distention, abdominal pain, blood in stool, constipation, diarrhea, nausea, rectal pain and vomiting.  Genitourinary: Negative for dysuria, hematuria and testicular pain.  Musculoskeletal: Negative for arthralgias and joint swelling.  Skin: Negative for rash.  Neurological: Negative for dizziness, syncope and headaches.  Hematological: Negative for adenopathy.  Psychiatric/Behavioral: Negative for confusion and dysphoric mood.       Objective:   Physical Exam  Constitutional: He is oriented to person, place, and time. He appears well-developed and well-nourished. No distress.  HENT:  Head: Normocephalic and atraumatic.  Right Ear: External ear normal.  Left Ear: External ear normal.  Mouth/Throat: Oropharynx is clear and moist.  Eyes: Pupils are equal, round, and reactive to light. Conjunctivae and EOM are normal.  Neck: Normal range of motion. Neck supple. No thyromegaly present.  Cardiovascular: Normal rate, regular rhythm and normal heart sounds.   No murmur heard. Pulmonary/Chest: No respiratory distress. He has no wheezes. He has no rales.  Abdominal: Soft. Bowel sounds are normal. He exhibits no distension and no mass. There is no tenderness. There is no rebound and no guarding.  Musculoskeletal: He exhibits no edema.  Lymphadenopathy:    He has no cervical adenopathy.  Neurological: He is  alert and oriented to person, place, and time. He displays normal reflexes. No cranial nerve deficit.  Skin: No rash noted.  Psychiatric: He has a normal mood and affect.       Assessment:     Physical exam-patient has multiple chronic medical problems as above    Plan:     -Discussed new shingles vaccine and he will check on insurance coverage  -We'll discuss whether to recheck DNA-based stool studies next year  -Continue with yearly flu vaccine. This was given today  -Recheck TSH and basic metabolic panel   [name] MD [facility]

## 2017-03-21 NOTE — Patient Instructions (Signed)
Consider new shingles vaccine (Shingrix).   

## 2017-03-22 LAB — TSH: TSH: 0.89 u[IU]/mL (ref 0.35–4.50)

## 2017-03-31 DIAGNOSIS — I5042 Chronic combined systolic (congestive) and diastolic (congestive) heart failure: Secondary | ICD-10-CM | POA: Diagnosis not present

## 2017-04-05 ENCOUNTER — Telehealth: Payer: Self-pay | Admitting: *Deleted

## 2017-04-05 NOTE — Telephone Encounter (Signed)
[facility] [place] atorvastatin (LIPITOR) 40 MG tablet Medication is historical.  Okay to fill?

## 2017-04-05 NOTE — Telephone Encounter (Signed)
Refill for one year 

## 2017-04-06 ENCOUNTER — Other Ambulatory Visit: Payer: Self-pay | Admitting: Internal Medicine

## 2017-04-06 MED ORDER — ATORVASTATIN CALCIUM 40 MG PO TABS: 40.0000 mg | ORAL_TABLET | Freq: Every day | ORAL | 3 refills | Status: DC

## 2017-04-06 NOTE — Telephone Encounter (Signed)
Rx sent 

## 2017-04-15 ENCOUNTER — Other Ambulatory Visit (HOSPITAL_COMMUNITY): Payer: Self-pay | Admitting: Internal Medicine

## 2017-04-18 ENCOUNTER — Encounter: Payer: Self-pay | Admitting: Podiatry

## 2017-04-18 ENCOUNTER — Ambulatory Visit (INDEPENDENT_AMBULATORY_CARE_PROVIDER_SITE_OTHER): Payer: Medicare HMO | Admitting: Podiatry

## 2017-04-18 VITALS — BP 153/75 | HR 55 | Ht 73.0 in | Wt 190.0 lb

## 2017-04-18 DIAGNOSIS — M79672 Pain in left foot: Secondary | ICD-10-CM

## 2017-04-18 DIAGNOSIS — B070 Plantar wart: Secondary | ICD-10-CM | POA: Diagnosis not present

## 2017-04-18 DIAGNOSIS — M2042 Other hammer toe(s) (acquired), left foot: Secondary | ICD-10-CM | POA: Diagnosis not present

## 2017-04-18 DIAGNOSIS — M79671 Pain in right foot: Secondary | ICD-10-CM

## 2017-04-18 DIAGNOSIS — M2041 Other hammer toe(s) (acquired), right foot: Secondary | ICD-10-CM

## 2017-04-18 NOTE — Patient Instructions (Addendum)
Seen for painful calluses. Bilateral porokeratosis, painful. All lesions debrided. Return as needed.

## 2017-04-18 NOTE — Progress Notes (Signed)
SUBJECTIVE: 61 y.o. year old male presents complaining of painful plantar warts. Stated that he has pain under the ball of both feet, under 4th MPJ left, and under 2nd MPJ right. Been trimming at home in regular bases but had an episode of infection. Had been trimmed by a doctor 15 years ago.  Due to his heart condition he was advised to avoid any infection at all cost.  Positive for open heart surgery x 3, 2011, 2016, and when at age 24 (63)  REVIEW OF SYSTEMS: A comprehensive review of systems was negative except for: Heart valve failure and repair and replacement of Aortic valve.  OBJECTIVE: DERMATOLOGIC EXAMINATION: Deep porokeratotic lesion under 4th MPJ left, 2nd MPJ right, symptomatic.  VASCULAR EXAMINATION OF LOWER LIMBS: All pedal pulses are palpable with normal pulsation.  Capillary Filling times within 3 seconds in all digits.  No edema or erythema noted. NEUROLOGIC EXAMINATION OF THE LOWER LIMBS: Achilles DTR is present and within normal. Monofilament (Semmes-Weinstein 10-gm) sensory testing positive 6 out of 6, bilateral. Vibratory sensations(128Hz  turning fork) intact at medial and lateral forefoot bilateral.  Sharp and Dull discriminatory sensations at the plantar ball of hallux is intact bilateral.   MUSCULOSKELETAL EXAMINATION: Positive for contracted lesser digits bilateral.   ASSESSMENT: Plantar porokeratosis sub 4 left, sub 2 right. R/O Plantar wart. Pain with ambulation.  PLAN: Reviewed clinical findings and available treatment options. All lesions debrided. May benefit from custom orthotics. Return as needed.

## 2017-04-19 ENCOUNTER — Encounter: Payer: Self-pay | Admitting: Family Medicine

## 2017-04-20 ENCOUNTER — Ambulatory Visit (INDEPENDENT_AMBULATORY_CARE_PROVIDER_SITE_OTHER): Payer: Medicare HMO

## 2017-04-20 ENCOUNTER — Telehealth: Payer: Self-pay | Admitting: Cardiology

## 2017-04-20 DIAGNOSIS — Z95810 Presence of automatic (implantable) cardiac defibrillator: Secondary | ICD-10-CM | POA: Diagnosis not present

## 2017-04-20 DIAGNOSIS — I5042 Chronic combined systolic (congestive) and diastolic (congestive) heart failure: Secondary | ICD-10-CM | POA: Diagnosis not present

## 2017-04-20 NOTE — Telephone Encounter (Signed)
LMOVM reminding pt to send remote transmission.   

## 2017-04-23 NOTE — Progress Notes (Signed)
EPIC Encounter for ICM Monitoring  Patient Name: Bradley [name] is a 61 y.o. male Date: 04/23/2017 Primary Care Physican: [name], MD Primary Cardiologist:Ross/Bensimhon Electrophysiologist: Caryl Comes Nephrologist:Deterding Dry Weight: 186 lbs            Heart Failure questions reviewed, pt asymptomatic.   Thoracic impedance normal.  Prescribed dosage: Furosemide 20 mg 1 tablet every Monday and Thursday.   Labs: 03/21/2017 Creatinine 1.27, BUN 18, Potassium 3.9, Sodium 139, EGFR 61.46 01/01/2017 Creatinine 1.39, BUN 22, Potassium 3.6, Sodium 143, EGFR 54->60 10/22/2016 Creatinine 1.09, BUN 20, Potassium 3.8, Sodium 145, EGFR >60  10/04/2016 Creatinine 1.23, BUN 20, Potassium 4.0, Sodium 140 08/09/2016 Creatinine 1.19, BUN 19, Potassium 3.9, Sodium 142, EGFR > 60 07/26/2016 Creatinine 1.13, BUN 17, Potassium 5.5, Sodium 140, EGFR > 60 04/24/2016 Creatinine 1.29, BUN 16, Potassium 5.8, Sodium 139 03/06/2016 Creatinine 1.40, BUN 19, Potassium 5.4, Sodium 140, EGFR 55.2  12/06/2015 Creatinine 1.34, BUN 17, Potassium 5.2, Sodium 140  10/14/2015 Creatinine 1.28, BUN 22, Potassium 4.5, Sodium 140  12/12/2016Creatinine 1.21, BUN 28, Potassium 5.2, Sodium 139   Recommendations: No changes.   Encouraged to call for fluid symptoms.  Follow-up plan: ICM clinic phone appointment on 05/21/2017.  Office appointment scheduled 06/18/2017 with Dr. Caryl Comes.  Copy of ICM check sent to Dr. Caryl Comes.   3 month ICM trend: 04/20/2017   1 Year ICM trend:      [name], RN 04/23/2017 8:31 AM

## 2017-04-27 ENCOUNTER — Other Ambulatory Visit: Payer: Self-pay | Admitting: Internal Medicine

## 2017-04-30 DIAGNOSIS — I5042 Chronic combined systolic (congestive) and diastolic (congestive) heart failure: Secondary | ICD-10-CM | POA: Diagnosis not present

## 2017-05-07 ENCOUNTER — Other Ambulatory Visit (HOSPITAL_COMMUNITY): Payer: Self-pay | Admitting: Internal Medicine

## 2017-05-15 ENCOUNTER — Other Ambulatory Visit (HOSPITAL_COMMUNITY): Payer: Self-pay | Admitting: *Deleted

## 2017-05-15 MED ORDER — SPIRONOLACTONE 25 MG PO TABS: 12.5000 mg | ORAL_TABLET | Freq: Every day | ORAL | 3 refills | Status: DC

## 2017-05-20 ENCOUNTER — Other Ambulatory Visit: Payer: Self-pay | Admitting: Family Medicine

## 2017-05-21 ENCOUNTER — Ambulatory Visit (INDEPENDENT_AMBULATORY_CARE_PROVIDER_SITE_OTHER): Payer: Medicare HMO | Admitting: *Deleted

## 2017-05-21 DIAGNOSIS — I428 Other cardiomyopathies: Secondary | ICD-10-CM

## 2017-05-21 DIAGNOSIS — Z95810 Presence of automatic (implantable) cardiac defibrillator: Secondary | ICD-10-CM | POA: Diagnosis not present

## 2017-05-21 DIAGNOSIS — I5042 Chronic combined systolic (congestive) and diastolic (congestive) heart failure: Secondary | ICD-10-CM | POA: Diagnosis not present

## 2017-05-21 NOTE — Progress Notes (Signed)
Remote ICD transmission.   

## 2017-05-22 NOTE — Progress Notes (Signed)
EPIC Encounter for ICM Monitoring  Patient Name: Bradley [name] is a 61 y.o. male Date: 05/22/2017 Primary Care Physican: Bradley Post, MD Primary Cardiologist:Bradley [name]/Bradley [name] Electrophysiologist: Bradley [name] Nephrologist:Bradley [name] Dry Weight:Last weight 186 lbs       Spoke with daughter Bradley [name], [place]. Heart Failure questions reviewed, pt asymptomatic.   Thoracic impedance normal  Prescribed dosage: Furosemide 20 mg 1 tablet every Monday and Thursday.   Labs: 03/21/2017 Creatinine 1.27, BUN 18, Potassium 3.9, Sodium 139, EGFR 61.46 01/01/2017 Creatinine 1.39, BUN 22, Potassium 3.6, Sodium 143, EGFR 54->60 10/22/2016 Creatinine 1.09, BUN 20, Potassium 3.8, Sodium 145, EGFR >60  10/04/2016 Creatinine 1.23, BUN 20, Potassium 4.0, Sodium 140 08/09/2016 Creatinine 1.19, BUN 19, Potassium 3.9, Sodium 142, EGFR > 60 07/26/2016 Creatinine 1.13, BUN 17, Potassium 5.5, Sodium 140, EGFR > 60 04/24/2016 Creatinine 1.29, BUN 16, Potassium 5.8, Sodium 139 03/06/2016 Creatinine 1.40, BUN 19, Potassium 5.4, Sodium 140, EGFR 55.2  12/06/2015 Creatinine 1.34, BUN 17, Potassium 5.2, Sodium 140  10/14/2015 Creatinine 1.28, BUN 22, Potassium 4.5, Sodium 140  12/12/2016Creatinine 1.21, BUN 28, Potassium 5.2, Sodium 139   Recommendations:  No changes.    Encouraged to call for fluid symptoms.  Follow-up plan: ICM clinic phone appointment on 07/19/2017.   Office appointment with Dr Bradley [name] 06/18/2017.  Copy of ICM check sent to Dr. Caryl [name].   3 month ICM trend: 05/21/2017   1 Year ICM trend:      Bradley Billings, RN 05/22/2017 2:17 PM

## 2017-05-23 LAB — CUP PACEART REMOTE DEVICE CHECK
Battery Remaining Longevity: 73 mo
Battery Remaining Percentage: 71 %
Battery Voltage: 2.96 V
Brady Statistic RV Percent Paced: 1 %
Date Time Interrogation Session: 20181022060016
HighPow Impedance: 68 Ohm
HighPow Impedance: 68 Ohm
Implantable Lead Implant Date: 20160603
Implantable Lead Location: 753860
Implantable Lead Model: 7122
Implantable Pulse Generator Implant Date: 20160603
Lead Channel Impedance Value: 380 Ohm
Lead Channel Pacing Threshold Amplitude: 1 V
Lead Channel Pacing Threshold Pulse Width: 0.5 ms
Lead Channel Sensing Intrinsic Amplitude: 11.7 mV
Lead Channel Setting Pacing Amplitude: 2.5 V
Lead Channel Setting Pacing Pulse Width: 0.5 ms
Lead Channel Setting Sensing Sensitivity: 0.5 mV
Pulse Gen Serial Number: 7135169

## 2017-05-25 ENCOUNTER — Encounter: Payer: Self-pay | Admitting: Cardiology

## 2017-05-31 DIAGNOSIS — I5042 Chronic combined systolic (congestive) and diastolic (congestive) heart failure: Secondary | ICD-10-CM | POA: Diagnosis not present

## 2017-06-14 ENCOUNTER — Other Ambulatory Visit (HOSPITAL_COMMUNITY): Payer: Self-pay | Admitting: *Deleted

## 2017-06-14 MED ORDER — DIGOXIN 125 MCG PO TABS: 125.0000 ug | ORAL_TABLET | Freq: Every day | ORAL | 3 refills | Status: DC

## 2017-06-18 ENCOUNTER — Encounter: Payer: Self-pay | Admitting: Internal Medicine

## 2017-06-18 ENCOUNTER — Ambulatory Visit (INDEPENDENT_AMBULATORY_CARE_PROVIDER_SITE_OTHER): Payer: Medicare HMO | Admitting: Internal Medicine

## 2017-06-18 VITALS — BP 112/64 | HR 56 | Ht 73.0 in | Wt 194.0 lb

## 2017-06-18 DIAGNOSIS — I5042 Chronic combined systolic (congestive) and diastolic (congestive) heart failure: Secondary | ICD-10-CM | POA: Diagnosis not present

## 2017-06-18 DIAGNOSIS — I428 Other cardiomyopathies: Secondary | ICD-10-CM

## 2017-06-18 DIAGNOSIS — Z79899 Other long term (current) drug therapy: Secondary | ICD-10-CM | POA: Diagnosis not present

## 2017-06-18 LAB — CUP PACEART INCLINIC DEVICE CHECK
Battery Remaining Longevity: 74 mo
Brady Statistic RV Percent Paced: 0.01 %
Date Time Interrogation Session: 20181119140828
HighPow Impedance: 70.875
Implantable Lead Implant Date: 20160603
Implantable Lead Location: 753860
Implantable Lead Model: 7122
Implantable Pulse Generator Implant Date: 20160603
Lead Channel Impedance Value: 362.5 Ohm
Lead Channel Pacing Threshold Amplitude: 1.25 V
Lead Channel Pacing Threshold Amplitude: 1.25 V
Lead Channel Pacing Threshold Pulse Width: 0.5 ms
Lead Channel Pacing Threshold Pulse Width: 0.5 ms
Lead Channel Sensing Intrinsic Amplitude: 11.7 mV
Lead Channel Setting Pacing Amplitude: 2.5 V
Lead Channel Setting Pacing Pulse Width: 0.5 ms
Lead Channel Setting Sensing Sensitivity: 0.5 mV
Pulse Gen Serial Number: 7135169

## 2017-06-18 NOTE — Progress Notes (Signed)
Patient Care Team: [name], MD as PCP - General Fay Records, MD as PCP - Cardiology (Cardiology) [name], MD as Consulting Physician (Cardiothoracic Surgery) Deterding, Jeneen Rinks, MD as Consulting Physician (Nephrology) Deboraha Sprang, MD as Consulting Physician (Cardiology)   HPI  Bradley [name] is a 61 y.o. male Seen in follow-up for implantable defibrillator St Jude  for sustained ventricular tachycardia 5/16 occurring with a frequency that required adjunctive medical therapy. He ended up on amiodarone.   A balloon valvotomy at age 66 for presumed congenital aortic stenosis   Bentall procedure in 2001 with a pericardial tissue valve. He had MSSA bacteremia and bacterial endocarditis in 12/8125 complicated by renal failure requiring dialysis for a short period of time. 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 Anxiety   . Aortic stenosis    s/p Bentall with bioprosthetic AVR 02/2010; Last echo (9/11): Moderate LVH, EF 45-50%, AVR functioning appropriately, aortic valve mean gradient 21, diastolic dysfunction. Chest MRA (2/13): Mild to moderate dilatation at the sinus of Valsalva at 4.1 cm, mild dilatation ascending aorta distal to the tube graft at 3.9 cm, moderate dilatation of the innominate artery a 2.1 cm;    . Depression   . ESRD on dialysis [facility])    a. 09/2013 felt to be related to gentamycin b. no longer requiring HD  . Hx of  cardiac cath    a. LHC in 02/2010: normal cors  . Hx of echocardiogram 02/2014   Echo (8/15):  Mod LV, EF 35-40%, Gr 1 DD, AVR ok (mean 14 mmHg), mild LAE  . Hypertension   . Hypothyroidism, postsurgical   . Prosthetic valve endocarditis ([place])   . Staphylococcus aureus bacteremia   . Thyroid cancer ([place])   . Ventricular tachycardia ([place])    a. s/p STJ ICD    Past Surgical History:  Procedure Laterality Date  . Aortic Arch Angiography N/A 01/01/2015   Performed by Troy Sine, MD at [place] [facility]  . AORTIC VALVE REPAIR  1968  . AORTIC VALVE REPLACEMENT  2011  . ARTERIOVENOUS (AV) FISTULA CREATION- LEFT ARM; Radial Cephalic Left 12/15/15   Performed by Mal [name], MD at [place]  . CARDIAC CATHETERIZATION  03/02/2010   NORMAL CORONARY ARTERY  . CARDIAC VALVE REPLACEMENT    . Coronary/Graft Angiography N/A 01/01/2015   Performed by Troy Sine, MD at [place] [facility]  . ICD Implant N/A 01/01/2015   Performed by Deboraha Sprang, MD at [place] [facility]  . INTRAOPERATIVE TRANSESOPHAGEAL ECHOCARDIOGRAM N/A 10/28/2013   Performed by [name], MD at [place], debridment of aoritc root abscess, replacement of aortic root, ascending aorta and aortic valve with homograft. Insertion of left femoral arterial line N/A 10/28/2013  Performed by [name], MD at [place] 10/28/2013   Performed by [name], MD at [place] Right 2012  . STERNOTOMY     REDO  . THYROIDECTOMY  ~ 2005  . TRANSESOPHAGEAL ECHOCARDIOGRAM (TEE) N/A 10/24/2013   Performed by Dorothy Spark, MD at [place]  . TRANSTHORACIC ECHOCARDIOGRAM  03/2010   SHOWED MILD REDUCTION OF LV FUNCTION    Current Outpatient Medications  Medication Sig Dispense Refill  . aspirin EC 81 MG tablet Take 81 mg by mouth daily.     Marland [facility] atorvastatin (LIPITOR) 40 MG tablet Take 1 tablet (40 mg total) by mouth daily.  90 tablet 3  . betamethasone valerate ointment (VALISONE) 0.1 % APPLY  OINTMENT TO AFFECTED AREA TWICE DAILY 30 g 2  . carvedilol (COREG) 6.25 MG tablet Take 6.25 mg by mouth 2 (two) times daily with a meal.    . clonazePAM (KLONOPIN) 1 MG tablet TAKE ONE TABLET BY MOUTH TWICE DAILY AS NEEDED NOT  FOR  REGULAR  USE 30 tablet 0  . digoxin (LANOXIN) 0.125 MG tablet Take 1 tablet (125 mcg total) daily by mouth. 90 tablet 3  . docusate sodium (STOOL SOFTENER) 100 MG capsule Take 100 mg by mouth 2 (two) times daily.    Marland Kitchen ENSURE (ENSURE) Take 237 mLs by mouth daily as needed. 237 mL 12  . ENTRESTO 97-103 MG TAKE 1 TABLET BY MOUTH 2 (TWO) TIMES DAILY. 60 tablet 6  . furosemide (LASIX) 20 MG tablet TAKE 1 TABLET EVERY MONDAY AND THURSDAY 25 tablet 3  . levothyroxine (SYNTHROID, LEVOTHROID) 175 MCG tablet TAKE 1 TABLET (175 MCG TOTAL) BY MOUTH DAILY BEFORE BREAKFAST. 90 tablet 3  . mexiletine (MEXITIL) 150 MG capsule TAKE 2 CAPSULES BY MOUTH EVERY MORNING AND TAKE 2 CAPSULES EVERY EVENING 120 capsule 11  . Neomycin-Bacitracin-Polymyxin (HCA TRIPLE ANTIBIOTIC OINTMENT EX) Apply 1 application topically 2 (two) times daily.     Marland Kitchen RANEXA 500 MG 12 hr tablet TAKE 1 TABLET BY MOUTH TWICE A DAY 60 tablet 11  . sertraline (ZOLOFT) 50 MG tablet Take 1 tablet (50 mg total) by mouth daily. 90 tablet 3  . spironolactone (ALDACTONE) 25 MG tablet Take 0.5 tablets (12.5 mg total) by mouth daily. 15 tablet 3   No current facility-administered medications for this visit.     Allergies  Allergen Reactions  . Oxycodone Other (See Comments)    Gives patient nightmares  . Rifampin Nausea Only      Review of Systems negative except from HPI and PMH  Physical Exam BP 112/64   Pulse (!) 56   Ht 6\' 1"  (1.854 m)   Wt 194 lb (88 kg)   SpO2 98%   BMI 25.60 kg/m  Well developed and nourished in no acute distress HENT normal Neck supple with JVP-flat Carotids brisk and full without bruits Clear Regular rate and  rhythm, no murmurs or gallops Abd-soft with active BS without hepatomegaly No Clubbing cyanosis edema Skin-warm and dry A & Oriented  Grossly normal sensory and motor function   ECG demonstrates sinus@ 56 26/13/402 IMI  Assessment and  Plan  Ventricular tachycarda  ICD St Jude  The patient's device was interrogated.  The information was reviewed. No changes were made in the programming.     NICM  CHF chronic systolic  No intercurrent Ventricular tachycardia  Will continue mex and ranolazine  Will add  back aldactone at night to see if he can tolerate  With ongoing data and in the absence of Afib will stop dig  Check surveillance labs

## 2017-06-18 NOTE — Patient Instructions (Addendum)
Medication Instructions:   Your physician has recommended you make the following change in your medication:  1.)  STOP Digoxin  2.) RESTART Aldactone 12.5 mg daily at bedtime  -- If you need a refill on your cardiac medications before your next appointment, please call your pharmacy. --   Labwork: TODAY:  BMET  Your physician recommends that you return for lab work in about 3 weeks for a repeat BMET   Testing/Procedures: None ordered  Follow-Up:  Remote monitoring is used to monitor your ICD from home. This monitoring reduces the number of office visits required to check your device to one time per year. It allows [place] to keep an eye on the functioning of your device to ensure it is working properly. You are scheduled for a device check from home on 07/19/2017. You may send your transmission at any time that day. If you have a wireless device, the transmission will be sent automatically. After your physician reviews your transmission, you will receive a postcard with your next transmission date.   Your physician wants you to follow-up in: 3 months with Dr. Haroldine Laws.  You will receive a reminder letter in the mail two months in advance. If you don't receive a letter, please call our office to schedule the follow-up appointment.  Thank you for choosing [facility]!!   [phone]  Any Other Special Instructions Will Be Listed Below (If Applicable).

## 2017-06-19 ENCOUNTER — Telehealth: Payer: Self-pay

## 2017-06-19 ENCOUNTER — Telehealth (HOSPITAL_COMMUNITY): Payer: Self-pay | Admitting: Vascular Surgery

## 2017-06-19 LAB — BASIC METABOLIC PANEL
BUN/Creatinine Ratio: 22 (ref 10–24)
BUN: 25 mg/dL (ref 8–27)
CO2: 26 mmol/L (ref 20–29)
Calcium: 9.2 mg/dL (ref 8.6–10.2)
Chloride: 105 mmol/L (ref 96–106)
Creatinine, Ser: 1.15 mg/dL (ref 0.76–1.27)
GFR calc Af Amer: 80 mL/min/{1.73_m2} (ref >59)
GFR calc non Af Amer: 69 mL/min/{1.73_m2} (ref >59)
Glucose: 90 mg/dL (ref 65–99)
Potassium: 5.4 mmol/L — ABNORMAL HIGH (ref 3.5–5.2)
Sodium: 143 mmol/L (ref 134–144)

## 2017-06-19 NOTE — Telephone Encounter (Signed)
Result Notes for Basic Metabolic Panel (BMET)   Notes recorded by Frederik Schmidt, RN on 06/19/2017 at 5:21 PM EST Showed Dr. Caryl Comes lab results. Per Dr. Caryl Comes, he would like Aldactone stopped to lower the potassium

## 2017-06-19 NOTE — Telephone Encounter (Signed)
Spoke with patient's wife (DPR) and informed her to have her husband stop his Aldactone in order to try and lower his potassium.

## 2017-06-19 NOTE — Telephone Encounter (Signed)
Left pt to make f/u appt w/ db in Jan or feb

## 2017-06-30 DIAGNOSIS — I5042 Chronic combined systolic (congestive) and diastolic (congestive) heart failure: Secondary | ICD-10-CM | POA: Diagnosis not present

## 2017-07-09 ENCOUNTER — Other Ambulatory Visit: Payer: Medicare HMO

## 2017-07-11 ENCOUNTER — Other Ambulatory Visit: Payer: Self-pay | Admitting: Internal Medicine

## 2017-07-11 NOTE — Telephone Encounter (Signed)
Will route to Dr. Harrington Challenger.  Unclear what patient uses this ointment for.

## 2017-07-11 NOTE — Telephone Encounter (Signed)
Okay to refill? Please advise. Thanks, MI 

## 2017-07-11 NOTE — Telephone Encounter (Signed)
Primary MD or derm should send in Rx for creams

## 2017-07-12 ENCOUNTER — Other Ambulatory Visit: Payer: Self-pay | Admitting: Internal Medicine

## 2017-07-12 DIAGNOSIS — I10 Essential (primary) hypertension: Secondary | ICD-10-CM

## 2017-07-13 ENCOUNTER — Other Ambulatory Visit: Payer: Medicare HMO

## 2017-07-16 ENCOUNTER — Other Ambulatory Visit (INDEPENDENT_AMBULATORY_CARE_PROVIDER_SITE_OTHER): Payer: Medicare HMO

## 2017-07-16 DIAGNOSIS — I428 Other cardiomyopathies: Secondary | ICD-10-CM

## 2017-07-16 DIAGNOSIS — I5042 Chronic combined systolic (congestive) and diastolic (congestive) heart failure: Secondary | ICD-10-CM | POA: Diagnosis not present

## 2017-07-16 DIAGNOSIS — Z79899 Other long term (current) drug therapy: Secondary | ICD-10-CM | POA: Diagnosis not present

## 2017-07-16 LAB — BASIC METABOLIC PANEL
BUN: 21 mg/dL (ref 6–23)
CO2: 32 mEq/L (ref 19–32)
Calcium: 9.4 mg/dL (ref 8.4–10.5)
Chloride: 104 mEq/L (ref 96–112)
Creatinine, Ser: 1.16 mg/dL (ref 0.40–1.50)
GFR: 68.16 mL/min (ref >60.00)
Glucose, Bld: 71 mg/dL (ref 70–99)
Potassium: 4.7 mEq/L (ref 3.5–5.1)
Sodium: 141 mEq/L (ref 135–145)

## 2017-07-16 NOTE — Addendum Note (Signed)
Addended by: Elmer Picker on: 07/16/2017 12:44 PM   Modules accepted: Orders

## 2017-07-19 ENCOUNTER — Ambulatory Visit (INDEPENDENT_AMBULATORY_CARE_PROVIDER_SITE_OTHER): Payer: Medicare HMO

## 2017-07-19 DIAGNOSIS — Z95810 Presence of automatic (implantable) cardiac defibrillator: Secondary | ICD-10-CM

## 2017-07-19 DIAGNOSIS — I5042 Chronic combined systolic (congestive) and diastolic (congestive) heart failure: Secondary | ICD-10-CM

## 2017-07-19 NOTE — Progress Notes (Signed)
EPIC Encounter for ICM Monitoring  Patient Name: Bradley [name] is a 61 y.o. male Date: 07/19/2017 Primary Care Physican: [name], MD Primary Cardiologist:Ross/Bensimhon Electrophysiologist: Caryl Comes Nephrologist:Deterding Dry Weight:Last weight 186 lbs                                                 Spoke with wife.  Heart Failure questions reviewed, pt currently has feeling tightness and fullness in his face which is his symptom of fluid accumulation when he has it.   Thoracic impedance abnormal suggesting fluid accumulation since 07/13/2017 but is trending back toward baseline.  Prescribed dosage: Furosemide 20 mg 1 tablet every Monday and Thursday.   Labs: 07/16/2017 Creatinine 1.16, BUN 21, Potassium 4.7, Sodium 141, EGFR 68.16 06/18/2017 Creatinine 1.15, BUN 25, Potassium 5.4, Sodium 143 03/21/2017 Creatinine 1.27, BUN 18, Potassium 3.9, Sodium 139, EGFR 61.46 01/01/2017 Creatinine 1.39, BUN 22, Potassium 3.6, Sodium 143, EGFR 54->60 10/22/2016 Creatinine 1.09, BUN 20, Potassium 3.8, Sodium 145, EGFR >60  10/04/2016 Creatinine 1.23, BUN 20, Potassium 4.0, Sodium 140 08/09/2016 Creatinine 1.19, BUN 19, Potassium 3.9, Sodium 142, EGFR > 60 07/26/2016 Creatinine 1.13, BUN 17, Potassium 5.5, Sodium 140, EGFR > 60 04/24/2016 Creatinine 1.29, BUN 16, Potassium 5.8, Sodium 139 03/06/2016 Creatinine 1.40, BUN 19, Potassium 5.4, Sodium 140, EGFR 55.2  12/06/2015 Creatinine 1.34, BUN 17, Potassium 5.2, Sodium 140  10/14/2015 Creatinine 1.28, BUN 22, Potassium 4.5, Sodium 140  12/12/2016Creatinine 1.21, BUN 28, Potassium 5.2, Sodium 139   Recommendations: Reviewed with Dr Caryl Comes in the office and recommendation to decrease salt intake and continue to monitor.  If symptoms worsen or don't resolve to call back or use 911 for emergency.   Follow-up plan: ICM clinic phone appointment on 08/02/2017 to recheck fluid levels.    Copy of ICM check sent to Dr. Caryl Comes and Dr.  Haroldine Laws.   3 month ICM trend: 07/19/2017    1 Year ICM trend:       [name], RN 07/19/2017 8:39 AM

## 2017-07-31 DIAGNOSIS — I5042 Chronic combined systolic (congestive) and diastolic (congestive) heart failure: Secondary | ICD-10-CM | POA: Diagnosis not present

## 2017-08-02 ENCOUNTER — Ambulatory Visit (INDEPENDENT_AMBULATORY_CARE_PROVIDER_SITE_OTHER): Payer: Self-pay

## 2017-08-02 ENCOUNTER — Telehealth: Payer: Self-pay | Admitting: Cardiology

## 2017-08-02 DIAGNOSIS — Z95810 Presence of automatic (implantable) cardiac defibrillator: Secondary | ICD-10-CM

## 2017-08-02 DIAGNOSIS — I5042 Chronic combined systolic (congestive) and diastolic (congestive) heart failure: Secondary | ICD-10-CM

## 2017-08-02 NOTE — Progress Notes (Signed)
EPIC Encounter for ICM Monitoring  Patient Name: Bradley [name] is a 62 y.o. male Date: 08/02/2017 Primary Care Physican: [name], MD Primary Cardiologist:Ross/Bensimhon Electrophysiologist: Caryl Comes Nephrologist:Deterding Dry Weight:Last weight 186        Attempted call to wife and unable to reach.  Left message to return call.  Transmission reviewed.    Thoracic impedance returned to normal since last ICM transmission on 07/19/2017.   Prescribed dosage: Furosemide 20 mg 1 tablet every Monday and Thursday.   Labs: 07/16/2017 Creatinine 1.16, BUN 21, Potassium 4.7, Sodium 141, EGFR 68.16 06/18/2017 Creatinine 1.15, BUN 25, Potassium 5.4, Sodium 143 03/21/2017 Creatinine 1.27, BUN 18, Potassium 3.9, Sodium 139, EGFR 61.46 01/01/2017 Creatinine 1.39, BUN 22, Potassium 3.6, Sodium 143, EGFR 54->60 10/22/2016 Creatinine 1.09, BUN 20, Potassium 3.8, Sodium 145, EGFR >60  10/04/2016 Creatinine 1.23, BUN 20, Potassium 4.0, Sodium 140 08/09/2016 Creatinine 1.19, BUN 19, Potassium 3.9, Sodium 142, EGFR > 60 07/26/2016 Creatinine 1.13, BUN 17, Potassium 5.5, Sodium 140, EGFR > 60 04/24/2016 Creatinine 1.29, BUN 16, Potassium 5.8, Sodium 139 03/06/2016 Creatinine 1.40, BUN 19, Potassium 5.4, Sodium 140, EGFR 55.2  12/06/2015 Creatinine 1.34, BUN 17, Potassium 5.2, Sodium 140  10/14/2015 Creatinine 1.28, BUN 22, Potassium 4.5, Sodium 140  12/12/2016Creatinine 1.21, BUN 28, Potassium 5.2, Sodium 139  Recommendations: NONE - Unable to reach.  Follow-up plan: ICM clinic phone appointment on 08/20/2017.    Copy of ICM check sent to Dr. Caryl Comes.   3 month ICM trend: 07/27/2017    1 Year ICM trend:       [name], RN 08/02/2017 1:02 PM

## 2017-08-02 NOTE — Telephone Encounter (Signed)
Confirmed remote transmission w/ pt wife.   

## 2017-08-03 ENCOUNTER — Telehealth: Payer: Self-pay

## 2017-08-03 NOTE — Telephone Encounter (Signed)
Remote ICM transmission received.  Attempted call to wife and left message to return call regarding transmission.

## 2017-08-20 ENCOUNTER — Ambulatory Visit (INDEPENDENT_AMBULATORY_CARE_PROVIDER_SITE_OTHER): Payer: Medicare HMO | Admitting: *Deleted

## 2017-08-20 DIAGNOSIS — Z95810 Presence of automatic (implantable) cardiac defibrillator: Secondary | ICD-10-CM

## 2017-08-20 DIAGNOSIS — I428 Other cardiomyopathies: Secondary | ICD-10-CM | POA: Diagnosis not present

## 2017-08-20 DIAGNOSIS — I5042 Chronic combined systolic (congestive) and diastolic (congestive) heart failure: Secondary | ICD-10-CM

## 2017-08-20 NOTE — Progress Notes (Signed)
EPIC Encounter for ICM Monitoring  Patient Name: Bradley [name] is a 62 y.o. male Date: 08/20/2017 Primary Care Physican: [name], MD Primary Cardiologist:Ross/Bensimhon Electrophysiologist: Caryl Comes Nephrologist:Deterding Dry Weight:Last [id number]      Attempted to call wife.   Left detailed message regarding transmission.  Transmission reviewed.    Thoracic impedance normal.  Prescribed dosage: Furosemide 20 mg 1 tablet every Monday and Thursday.   Labs: 07/16/2017 Creatinine 1.16, BUN 21, Potassium 4.7, Sodium 141, EGFR 68.16 06/18/2017 Creatinine 1.15, BUN 25, Potassium 5.4, Sodium 143 03/21/2017 Creatinine 1.27, BUN 18, Potassium 3.9, Sodium 139, EGFR 61.46 01/01/2017 Creatinine 1.39, BUN 22, Potassium 3.6, Sodium 143, EGFR 54->60 10/22/2016 Creatinine 1.09, BUN 20, Potassium 3.8, Sodium 145, EGFR >60  10/04/2016 Creatinine 1.23, BUN 20, Potassium 4.0, Sodium 140 08/09/2016 Creatinine 1.19, BUN 19, Potassium 3.9, Sodium 142, EGFR > 60 07/26/2016 Creatinine 1.13, BUN 17, Potassium 5.5, Sodium 140, EGFR > 60 04/24/2016 Creatinine 1.29, BUN 16, Potassium 5.8, Sodium 139 03/06/2016 Creatinine 1.40, BUN 19, Potassium 5.4, Sodium 140, EGFR 55.2  12/06/2015 Creatinine 1.34, BUN 17, Potassium 5.2, Sodium 140  10/14/2015 Creatinine 1.28, BUN 22, Potassium 4.5, Sodium 140  12/12/2016Creatinine 1.21, BUN 28, Potassium 5.2, Sodium 139  Recommendations: Left voice mail with ICM number and encouraged to call if experiencing any fluid symptoms.  Follow-up plan: ICM clinic phone appointment on 09/20/2017.   Copy of ICM check sent to Dr. Caryl Comes.   3 month ICM trend: 08/20/2017    1 Year ICM trend:       [name], RN 08/20/2017 2:52 PM

## 2017-08-20 NOTE — Progress Notes (Signed)
Remote ICD transmission.   

## 2017-08-21 ENCOUNTER — Encounter: Payer: Self-pay | Admitting: Cardiology

## 2017-08-27 ENCOUNTER — Encounter: Payer: Self-pay | Admitting: Internal Medicine

## 2017-08-27 ENCOUNTER — Ambulatory Visit (INDEPENDENT_AMBULATORY_CARE_PROVIDER_SITE_OTHER): Payer: Medicare HMO | Admitting: Internal Medicine

## 2017-08-27 VITALS — BP 92/60 | HR 56 | Ht 73.0 in | Wt 191.8 lb

## 2017-08-27 DIAGNOSIS — I472 Ventricular tachycardia, unspecified: Secondary | ICD-10-CM

## 2017-08-27 DIAGNOSIS — I5042 Chronic combined systolic (congestive) and diastolic (congestive) heart failure: Secondary | ICD-10-CM

## 2017-08-27 DIAGNOSIS — E039 Hypothyroidism, unspecified: Secondary | ICD-10-CM | POA: Diagnosis not present

## 2017-08-27 DIAGNOSIS — I359 Nonrheumatic aortic valve disorder, unspecified: Secondary | ICD-10-CM | POA: Diagnosis not present

## 2017-08-27 LAB — LIPID PANEL
Chol/HDL Ratio: 2.3 ratio (ref 0.0–5.0)
Cholesterol, Total: 139 mg/dL (ref 100–199)
HDL: 60 mg/dL (ref >39)
LDL Calculated: 70 mg/dL (ref 0–99)
Triglycerides: 46 mg/dL (ref 0–149)
VLDL Cholesterol Cal: 9 mg/dL (ref 5–40)

## 2017-08-27 LAB — BASIC METABOLIC PANEL
BUN/Creatinine Ratio: 20 (ref 10–24)
BUN: 25 mg/dL (ref 8–27)
CO2: 23 mmol/L (ref 20–29)
Calcium: 9.2 mg/dL (ref 8.6–10.2)
Chloride: 103 mmol/L (ref 96–106)
Creatinine, Ser: 1.25 mg/dL (ref 0.76–1.27)
GFR calc Af Amer: 72 mL/min/{1.73_m2} (ref >59)
GFR calc non Af Amer: 62 mL/min/{1.73_m2} (ref >59)
Glucose: 89 mg/dL (ref 65–99)
Potassium: 4.9 mmol/L (ref 3.5–5.2)
Sodium: 141 mmol/L (ref 134–144)

## 2017-08-27 LAB — TSH: TSH: 0.696 u[IU]/mL (ref 0.450–4.500)

## 2017-08-27 NOTE — Progress Notes (Signed)
Cardiology Office Note   Date:  08/27/2017   ID:  Bradley [name], DOB [birth year], MRN [id number]  PCP:  Bradley Post, MD  Cardiologist:   Bradley Carnes, MD   F/U of CHF     History of Present Illness: Bradley [name] is a 62 y.o. male with a history of congenial AS (s/p valvotomy age 63; sp Bentall operation 7793; complicated by MSSA, Osteomyalitis and valve infection  S/p redo Bentall for aortic root abscess) Pt also with history of HTN, depression, thryorid Ca, VT   The pt was seen by Bradley [name] in November 2018  Pt was also seen by Bradley [name] in June 2018   Pt says he is doing pretty good  On level ground "I could go forever"  Has problems with stairs Notes occasional isolated palpitation  No CP  Breathing is Ok  No edema     Current Meds  Medication Sig  . aspirin EC 81 MG tablet Take 81 mg by mouth daily.   Marland Kitchen atorvastatin (LIPITOR) 40 MG tablet Take 1 tablet (40 mg total) by mouth daily.  . betamethasone valerate ointment (VALISONE) 0.1 % APPLY  OINTMENT TO AFFECTED AREA TWICE DAILY  . carvedilol (COREG) 6.25 MG tablet Take 6.25 mg by mouth 2 (two) times daily with a meal.  . clonazePAM (KLONOPIN) 1 MG tablet TAKE ONE TABLET BY MOUTH TWICE DAILY AS NEEDED NOT  FOR  REGULAR  USE  . docusate sodium (STOOL SOFTENER) 100 MG capsule Take 100 mg by mouth 2 (two) times daily.  Marland Kitchen ENSURE (ENSURE) Take 237 mLs by mouth daily as needed.  Marland Kitchen ENTRESTO 97-103 MG TAKE 1 TABLET BY MOUTH 2 (TWO) TIMES DAILY.  . furosemide (LASIX) 20 MG tablet TAKE 1 TABLET BY MOUTH EVERY MONDAY AND THURSDAY  . levothyroxine (SYNTHROID, LEVOTHROID) 175 MCG tablet TAKE 1 TABLET (175 MCG TOTAL) BY MOUTH DAILY BEFORE BREAKFAST.  Marland Kitchen mexiletine (MEXITIL) 150 MG capsule TAKE 2 CAPSULES BY MOUTH EVERY MORNING AND TAKE 2 CAPSULES EVERY EVENING  . RANEXA 500 MG 12 hr tablet TAKE 1 TABLET BY MOUTH TWICE A DAY  . sertraline (ZOLOFT) 50 MG tablet Take 1 tablet (50 mg total) by mouth daily.     Allergies:   Oxycodone  and Rifampin   Past Medical History:  Diagnosis Date  . Anxiety   . Aortic stenosis    s/p Bentall with bioprosthetic AVR 02/2010; Last echo (9/11): Moderate LVH, EF 45-50%, AVR functioning appropriately, aortic valve mean gradient 21, diastolic dysfunction. Chest MRA (2/13): Mild to moderate dilatation at the sinus of Valsalva at 4.1 cm, mild dilatation ascending aorta distal to the tube graft at 3.9 cm, moderate dilatation of the innominate artery a 2.1 cm;    . Depression   . ESRD on dialysis [facility])    a. 09/2013 felt to be related to gentamycin b. no longer requiring HD  . Hx of cardiac cath    a. LHC in 02/2010: normal cors  . Hx of echocardiogram 02/2014   Echo (8/15):  Mod LV, EF 35-40%, Gr 1 DD, AVR ok (mean 14 mmHg), mild LAE  . Hypertension   . Hypothyroidism, postsurgical   . Prosthetic valve endocarditis ([place])   . Staphylococcus aureus bacteremia   . Thyroid cancer ([place])   . Ventricular tachycardia ([place])    a. s/p STJ ICD    Past Surgical History:  Procedure Laterality Date  . AORTIC VALVE REPAIR  1968  . AORTIC VALVE REPLACEMENT  2011  . AV FISTULA PLACEMENT Left 10/08/2013   Procedure: ARTERIOVENOUS (AV) FISTULA CREATION- LEFT ARM; Radial Cephalic ;  Surgeon: Bradley [name], MD;  Location: [place];  Service: Vascular;  Laterality: Left;  . BENTALL PROCEDURE N/A 10/28/2013   Procedure: REDO BENTALL PROCEDURE, debridment of aoritc root abscess, replacement of aortic root, ascending aorta and aortic valve with homograft. Insertion of left femoral arterial line;  Surgeon: Bradley Isaac, MD;  Location: [place];  Service: Open Heart Surgery;  Laterality: N/A;  . CARDIAC CATHETERIZATION  03/02/2010   NORMAL CORONARY ARTERY  . CARDIAC CATHETERIZATION N/A 01/01/2015   Procedure: Coronary/Graft Angiography;  Surgeon: Bradley Sine, MD;  Location: [place] CV LAB;  Service: Cardiovascular;  Laterality: N/A;  . CARDIAC VALVE REPLACEMENT    . EP IMPLANTABLE DEVICE N/A 01/01/2015    Procedure: ICD Implant;  Surgeon: Bradley Sprang, MD;  Location: [place] CV LAB;  Service: Cardiovascular;  Laterality: N/A;  . INTRAOPERATIVE TRANSESOPHAGEAL ECHOCARDIOGRAM N/A 10/28/2013   Procedure: INTRAOPERATIVE TRANSESOPHAGEAL ECHOCARDIOGRAM;  Surgeon: Bradley Isaac, MD;  Location: [place];  Service: Open Heart Surgery;  Laterality: N/A;  . PERIPHERAL VASCULAR CATHETERIZATION N/A 01/01/2015   Procedure: Aortic Arch Angiography;  Surgeon: Bradley Sine, MD;  Location: [place] CV LAB;  Service: Cardiovascular;  Laterality: N/A;  . SHOULDER ARTHROSCOPY W/ ROTATOR CUFF REPAIR Right 2012  . STERNOTOMY     REDO  . TEE WITHOUT CARDIOVERSION N/A 10/24/2013   Procedure: TRANSESOPHAGEAL ECHOCARDIOGRAM (TEE);  Surgeon: Bradley Spark, MD;  Location: [facility] ENDOSCOPY;  Service: Cardiovascular;  Laterality: N/A;  . THYROIDECTOMY  ~ 2005  . TRANSTHORACIC ECHOCARDIOGRAM  03/2010   SHOWED MILD REDUCTION OF LV FUNCTION     Social History:  The patient  reports that  has never smoked. he has never used smokeless tobacco. He reports that he does not drink alcohol or use drugs.   Family History:  The patient's family history includes Heart disease in his father; Hypertension in his father and mother; Kidney Stones in his sister; Stroke in his father; Thyroid cancer in his sister and sister.    ROS:  Please see the history of present illness. All other systems are reviewed and  Negative to the above problem except as noted.    PHYSICAL EXAM: VS:  BP 92/60   Pulse (!) 56   Ht 6\' 1"  (1.854 m)   Wt 191 lb 12.8 oz (87 kg)   SpO2 99%   BMI 25.30 kg/m   GEN: Well nourished, well developed, in no acute distress  HEENT: normal  Neck: JVP normal  No , carotid bruits, or masses Cardiac: RRR; Gr II/VI systolic murmur base  No, rubs, or gallops,no edema  Respiratory:  clear to auscultation bilaterally, normal work of breathing GI: soft, nontender, nondistended, + BS  No hepatomegaly  MS: no deformity  Moving all extremities   Skin: warm and dry, no rash Neuro:  Strength and sensation are intact Psych: euthymic mood, full affect   EKG:  EKG is not  ordered today.   Lipid Panel    Component Value Date/Time   CHOL 144 11/06/2016 1159   TRIG 57 11/06/2016 1159   HDL 57 11/06/2016 1159   CHOLHDL 2.5 11/06/2016 1159   CHOLHDL 4 03/06/2016 1218   VLDL 23.2 03/06/2016 1218   LDLCALC 76 11/06/2016 1159      Wt Readings from Last 3 Encounters:  08/27/17 191 lb 12.8 oz (87 kg)  06/18/17  194 lb (88 kg)  04/18/17 190 lb (86.2 kg)      ASSESSMENT AND PLAN:  1  Chronic systolic CHF  Volume status is good   Keep on same meds  Was taken off of aldactone due to K 5.4  Will recheck today  Defer to Bradley [name]  Has f/u in 1 month ? Wife thinks he may be on digoxin  Told her to bring pill bottles to next visit    2  Hx VT  S/p ICD  Follows with S Klein  No recurrence  Continue Mexitil and Ranexa  3  Thyroid  WIll checck TSH    4  AV dz   S/p Bentall operation x 2  Valve sounds OK  Follow clinically for now  Current medicines are reviewed at length with the patient today.  The patient does not have concerns regarding medicines.  Signed, Bradley Carnes, MD  08/27/2017 11:38 AM    [address] [place], [place],   [address] Phone: [phone]; Fax: [phone]

## 2017-08-27 NOTE — Patient Instructions (Signed)
Your physician recommends that you continue on your current medications as directed. Please refer to the Current Medication list given to you today.  Your physician recommends that you return for lab work today (CBC, BMET, [place], TSH)  Your physician recommends that you keep your follow-up appointment already schedule at [facility].

## 2017-09-10 ENCOUNTER — Other Ambulatory Visit (HOSPITAL_COMMUNITY): Payer: Self-pay | Admitting: *Deleted

## 2017-09-10 MED ORDER — CARVEDILOL 6.25 MG PO TABS: 6.2500 mg | ORAL_TABLET | Freq: Two times a day (BID) | ORAL | 3 refills | Status: DC

## 2017-09-12 LAB — CUP PACEART REMOTE DEVICE CHECK
Battery Remaining Longevity: 71 mo
Battery Remaining Percentage: 68 %
Battery Voltage: 2.96 V
Brady Statistic RV Percent Paced: 1 %
Date Time Interrogation Session: 20190121151624
HighPow Impedance: 75 Ohm
HighPow Impedance: 75 Ohm
Implantable Lead Implant Date: 20160603
Implantable Lead Location: 753860
Implantable Lead Model: 7122
Implantable Pulse Generator Implant Date: 20160603
Lead Channel Impedance Value: 380 Ohm
Lead Channel Pacing Threshold Amplitude: 1.25 V
Lead Channel Pacing Threshold Pulse Width: 0.5 ms
Lead Channel Sensing Intrinsic Amplitude: 11.7 mV
Lead Channel Setting Pacing Amplitude: 2.5 V
Lead Channel Setting Pacing Pulse Width: 0.5 ms
Lead Channel Setting Sensing Sensitivity: 0.5 mV
Pulse Gen Serial Number: 7135169

## 2017-09-13 ENCOUNTER — Other Ambulatory Visit (HOSPITAL_COMMUNITY): Payer: Self-pay | Admitting: Internal Medicine

## 2017-09-16 ENCOUNTER — Other Ambulatory Visit: Payer: Self-pay | Admitting: Family Medicine

## 2017-09-19 ENCOUNTER — Other Ambulatory Visit: Payer: Self-pay

## 2017-09-19 ENCOUNTER — Ambulatory Visit (HOSPITAL_COMMUNITY)
Admission: RE | Admit: 2017-09-19 | Discharge: 2017-09-19 | Disposition: A | Discharge status: Home / Self Care | Payer: Medicare HMO | Source: Ambulatory Visit | Attending: Internal Medicine | Admitting: Internal Medicine

## 2017-09-19 VITALS — BP 130/84 | HR 57 | Wt 194.1 lb

## 2017-09-19 DIAGNOSIS — E890 Postprocedural hypothyroidism: Secondary | ICD-10-CM | POA: Insufficient documentation

## 2017-09-19 DIAGNOSIS — Z885 Allergy status to narcotic agent status: Secondary | ICD-10-CM | POA: Insufficient documentation

## 2017-09-19 DIAGNOSIS — I428 Other cardiomyopathies: Secondary | ICD-10-CM | POA: Diagnosis not present

## 2017-09-19 DIAGNOSIS — I38 Endocarditis, valve unspecified: Secondary | ICD-10-CM | POA: Diagnosis not present

## 2017-09-19 DIAGNOSIS — F419 Anxiety disorder, unspecified: Secondary | ICD-10-CM | POA: Diagnosis not present

## 2017-09-19 DIAGNOSIS — Z953 Presence of xenogenic heart valve: Secondary | ICD-10-CM | POA: Insufficient documentation

## 2017-09-19 DIAGNOSIS — Z950 Presence of cardiac pacemaker: Secondary | ICD-10-CM | POA: Insufficient documentation

## 2017-09-19 DIAGNOSIS — N186 End stage renal disease: Secondary | ICD-10-CM | POA: Insufficient documentation

## 2017-09-19 DIAGNOSIS — Z79899 Other long term (current) drug therapy: Secondary | ICD-10-CM | POA: Insufficient documentation

## 2017-09-19 DIAGNOSIS — I5022 Chronic systolic (congestive) heart failure: Secondary | ICD-10-CM | POA: Insufficient documentation

## 2017-09-19 DIAGNOSIS — Z7982 Long term (current) use of aspirin: Secondary | ICD-10-CM | POA: Insufficient documentation

## 2017-09-19 DIAGNOSIS — I472 Ventricular tachycardia, unspecified: Secondary | ICD-10-CM

## 2017-09-19 DIAGNOSIS — F329 Major depressive disorder, single episode, unspecified: Secondary | ICD-10-CM | POA: Insufficient documentation

## 2017-09-19 DIAGNOSIS — I5042 Chronic combined systolic (congestive) and diastolic (congestive) heart failure: Secondary | ICD-10-CM | POA: Diagnosis not present

## 2017-09-19 DIAGNOSIS — I132 Hypertensive heart and chronic kidney disease with heart failure and with stage 5 chronic kidney disease, or end stage renal disease: Secondary | ICD-10-CM | POA: Diagnosis not present

## 2017-09-19 DIAGNOSIS — Z79890 Hormone replacement therapy (postmenopausal): Secondary | ICD-10-CM | POA: Diagnosis not present

## 2017-09-19 DIAGNOSIS — Z992 Dependence on renal dialysis: Secondary | ICD-10-CM | POA: Insufficient documentation

## 2017-09-19 DIAGNOSIS — Z85850 Personal history of malignant neoplasm of thyroid: Secondary | ICD-10-CM | POA: Diagnosis not present

## 2017-09-19 LAB — BASIC METABOLIC PANEL
Anion gap: 9 (ref 5–15)
BUN: 16 mg/dL (ref 6–20)
CO2: 26 mmol/L (ref 22–32)
Calcium: 9.4 mg/dL (ref 8.9–10.3)
Chloride: 106 mmol/L (ref 101–111)
Creatinine, Ser: 1.09 mg/dL (ref 0.61–1.24)
GFR calc Af Amer: >60 mL/min (ref >60)
GFR calc non Af Amer: >60 mL/min (ref >60)
Glucose, Bld: 83 mg/dL (ref 65–99)
Potassium: 4.9 mmol/L (ref 3.5–5.1)
Sodium: 141 mmol/L (ref 135–145)

## 2017-09-19 LAB — DIGOXIN LEVEL: Digoxin Level: 0.9 ng/mL (ref 0.8–2.0)

## 2017-09-19 LAB — CBC
HCT: 44 % (ref 39.0–52.0)
Hemoglobin: 14.6 g/dL (ref 13.0–17.0)
MCH: 30.7 pg (ref 26.0–34.0)
MCHC: 33.2 g/dL (ref 30.0–36.0)
MCV: 92.6 fL (ref 78.0–100.0)
Platelets: 160 10*3/uL (ref 150–400)
RBC: 4.75 MIL/uL (ref 4.22–5.81)
RDW: 13.6 % (ref 11.5–15.5)
WBC: 6.8 10*3/uL (ref 4.0–10.5)

## 2017-09-19 LAB — BRAIN NATRIURETIC PEPTIDE: B Natriuretic Peptide: 489.1 pg/mL — ABNORMAL HIGH (ref 0.0–100.0)

## 2017-09-19 NOTE — Patient Instructions (Signed)
Labs drawn today (if we do not call you, then your lab work was stable)   Your physician has requested that you have an echocardiogram. Echocardiography is a painless test that uses sound waves to create images of your heart. It provides your doctor with information about the size and shape of your heart and how well your heart's chambers and valves are working. This procedure takes approximately one hour. There are no restrictions for this procedure.  Your physician recommends that you schedule a follow-up appointment in: 4 months with Dr. Haroldine Laws  an a echocardiogram

## 2017-09-19 NOTE — Progress Notes (Signed)
Patient ID: Bradley [name], male   DOB: February 01, 1956, 62 y.o.   MRN: [id number]    Advanced Heart Failure Clinic Note   Referring Physician: Chanetta Marshall, NP Primary Care: Dr [name] Primary Cardiologist: Dr Dorris Carnes Primary HF: Dr. Haroldine Laws  HPI: Bradley [name] is a 62 y.o. male with a complicated past medical history including thyroid cancer (s/p thyroidectomy), hypertension, depression, and congenital aortic stenosis for which he underwent aortic vavulotomy at age 77 with subsequent Bentall procedure in 2001 with a pericardial tissue valve. He had MSSA bacteremia and bacterial endocarditis in 03/2118 complicated by renal failure requiring dialysis for a short period of time. 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   He was admitted 11/2014 with progressive episodes of palpitations and pre-syncope. He was found to have monomorphic ventricular tachycardia. Catheterization demonstrated normal coronaries. Echocardiogram demonstrated EF 30-35% and he underwent STJ dual chamber ICD implant. Mexiletine was added prior to discharge and his Metoprolol was changed to Coreg. He had subsequent ventricular arrhythmias and his Mexelitine was increased with no further VT.  CPX test performed 10/25/15 showed a moderate functional impairment (pVO2 16.1). Resting spirometry reveals mild obstruction/restriction. There was also evidence of chronotropic incompetence.   Today he returns for HF follow up. Doing really well. 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 Feels good. Mild dyspnea if he does too much. Occasional fatigue. Taking care of animals. No edema, orthopnea, PND. Taking lasix 2x/week. Will occasioanlly take extra.   Echo 5/18 EF 25-30%   ICD interrogated personally: No VT. Volume status looks good.   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 Lives  at home with wife and 3/4 of his kids. 2 sons and 2 daughters.   CPX 01/01/17  FEV1 3.40 (86%)     FEV1/FVC 83 (107%)     MVV 163 (108%)     Exercise Time:  11:30  Watts: 132  Resting HR: 72 Peak HR: 129  (80% age predicted max HR) BP rest: 118/70 BP peak: 174/84  Peak VO2: 21.1 (73% predicted peak VO2) VE/VCO2 slope: 35 OUES: 1.96 Peak RER: 1.22 Ventilatory Threshold: 16.6 (58% predicted or measured peak VO2) VE/MVV: 50% O2pulse: 15  (100% predicted O2pulse)  CPX 10/25/15 FVC 3.47 (66%) FEV1 2.86 (71%) Resting HR 60 Peak 112 Resting BP 130/80 Peak 190/88 Peak V02 16.1 (55.2%) ER/DE08 slope: 35 OUES: 1.69 Peak RER 1.13 Ventilatory threshold 10.9 (37.4%) Peak RR 40 Peak Ventilation 72.2 VE/MVV 51% PETCO2 at peak: 31 O2 pulse 13 (81%)  Echo 04/2013 LVEF 60-65% Echo 08/2013 LVEF 45-50% Echo 11/2013 LVEF 20-25% Echo 02/2014 LVEF 35-40% Echo 12/30/14 LVEF 30-35%, Mild MR, Mod LAE, RV mildly dilated, Mild RAE Echo 5/17 25-30%  ECHO 12/21/2016 LVEF 25-30%   Past Medical History:  Diagnosis Date  . Anxiety   . Aortic stenosis    s/p Bentall with bioprosthetic AVR 02/2010; Last echo (9/11): Moderate LVH, EF 45-50%, AVR functioning appropriately, aortic valve mean gradient 21, diastolic dysfunction. Chest MRA (2/13): Mild to moderate dilatation at the sinus of Valsalva at 4.1 cm, mild dilatation ascending aorta distal to the tube graft at 3.9 cm, moderate dilatation of the innominate artery a 2.1 cm;    . Depression   . ESRD on dialysis [facility])    a. 09/2013 felt to be related to gentamycin b. no longer requiring HD  . Hx of cardiac cath  a. LHC in 02/2010: normal cors  . Hx of echocardiogram 02/2014   Echo (8/15):  Mod LV, EF 35-40%, Gr 1 DD, AVR ok (mean 14 mmHg), mild LAE  . Hypertension   . Hypothyroidism, postsurgical   . Prosthetic valve endocarditis ([place])   . Staphylococcus aureus bacteremia   . Thyroid cancer ([place])   . Ventricular tachycardia ([place])      a. s/p STJ ICD    Current Outpatient Medications  Medication Sig Dispense Refill  . aspirin EC 81 MG tablet Take 81 mg by mouth daily.     Marland Kitchen atorvastatin (LIPITOR) 40 MG tablet Take 1 tablet (40 mg total) by mouth daily. 90 tablet 3  . betamethasone valerate ointment (VALISONE) 0.1 % APPLY  OINTMENT TO AFFECTED AREA TWICE DAILY 30 g 2  . carvedilol (COREG) 6.25 MG tablet Take 1 tablet (6.25 mg total) by mouth 2 (two) times daily with a meal. 180 tablet 3  . clonazePAM (KLONOPIN) 1 MG tablet TAKE ONE TABLET BY MOUTH TWICE DAILY AS NEEDED NOT  FOR  REGULAR  USE 30 tablet 0  . docusate sodium (STOOL SOFTENER) 100 MG capsule Take 100 mg by mouth 2 (two) times daily.    Marland Kitchen ENSURE (ENSURE) Take 237 mLs by mouth daily as needed. 237 mL 12  . ENTRESTO 97-103 MG TAKE 1 TABLET BY MOUTH 2 (TWO) TIMES DAILY. 60 tablet 6  . furosemide (LASIX) 20 MG tablet TAKE 1 TABLET BY MOUTH EVERY MONDAY AND THURSDAY 25 tablet 3  . levothyroxine (SYNTHROID, LEVOTHROID) 175 MCG tablet TAKE 1 TABLET (175 MCG TOTAL) BY MOUTH DAILY BEFORE BREAKFAST. 90 tablet 3  . mexiletine (MEXITIL) 150 MG capsule TAKE 2 CAPSULES BY MOUTH EVERY MORNING AND TAKE 2 CAPSULES EVERY EVENING 120 capsule 11  . RANEXA 500 MG 12 hr tablet TAKE 1 TABLET BY MOUTH TWICE A DAY 60 tablet 11  . sertraline (ZOLOFT) 50 MG tablet Take 1 tablet (50 mg total) by mouth daily. 90 tablet 3  . sertraline (ZOLOFT) 50 MG tablet TAKE 1 TABLET BY MOUTH ONCE DAILY 90 tablet 0   No current facility-administered medications for this encounter.     Allergies  Allergen Reactions  . Oxycodone Other (See Comments)    Gives patient nightmares  . Rifampin Nausea Only      Social History   Socioeconomic History  . Marital status: Married    Spouse name: Not on file  . Number of children: Not on file  . Years of education: Not on file  . Highest education level: Not on file  Social Needs  . Financial resource strain: Not on file  . Food insecurity - worry:  Not on file  . Food insecurity - inability: Not on file  . Transportation needs - medical: Not on file  . Transportation needs - non-medical: Not on file  Occupational History  . Not on file  Tobacco Use  . Smoking status: Never Smoker  . Smokeless tobacco: Never Used  Substance and Sexual Activity  . Alcohol use: No    Alcohol/week: 0.0 oz    Comment: Occasional  . Drug use: No  . Sexual activity: No  Other Topics Concern  . Not on file  Social History Narrative   Lives at home with walker.  Does not drive.  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 Hypertension Mother   . Heart  disease Father   . Stroke Father   . Hypertension Father   . Thyroid cancer Sister   . Thyroid cancer Sister   . Kidney Stones Sister   . Heart attack Neg Hx     Vitals:   09/19/17 1356  BP: 130/84  Pulse: (!) 57  SpO2: 98%  Weight: 194 lb 1.9 oz (88.1 kg)   Wt Readings from Last 3 Encounters:  09/19/17 194 lb 1.9 oz (88.1 kg)  08/27/17 191 lb 12.8 oz (87 kg)  06/18/17 194 lb (88 kg)     PHYSICAL EXAM: General:  Well appearing. No resp difficulty HEENT: normal Neck: supple. no JVD. Carotids 2+ bilat; no bruits. No lymphadenopathy or thryomegaly appreciated. Cor: PMI nondisplaced. Regular rate & rhythm. 2/6 SEM RUSB  No s3 Lungs: clear Abdomen: soft, nontender, nondistended. No hepatosplenomegaly. No bruits or masses. Good bowel sounds. Extremities: no cyanosis, clubbing, rash, edema Neuro: alert & orientedx3, cranial nerves grossly intact. moves all 4 extremities w/o difficulty. Affect pleasant   . ASSESSMENT & PLAN:  1. Chronic systolic HF - NICM St [employer]  Echo 5/17 LVEF 25-30%, Mild MR, Mod LAE, RV mildly dilated, Mild RAE. ECHO 12/21/2016 LVEF 25-30%. IW AK RV mild-mod  - Doing well. NYHA II - March 2017 CPX with moderate HF limitation.  - CPX 6/18 much improved  - Had to stop spiro due to low BP and hyperkalemia - On  goal dose Entresto to 97/103.  - Continue coreg 6.25 mg BID. Unable to titrate due to low HR - Continue digoxin 0.125 mg daily. Check level. - Will repeat echo 2. VT/NICM - On mexilitene/Ranexa - No VT/VF on ICD. Interrogated personally.  3. HTN - Blood pressure well controlled. Continue current regimen. 4. Valvular heart disease - s/p bentall in 2001 and re-do bentall 2015. Stable on recent echo  - reinforced need for SBE with all procedures and dental cleanings 5. Depression - much improved   Glori Bickers, MD 2:08 PM

## 2017-09-20 ENCOUNTER — Ambulatory Visit (INDEPENDENT_AMBULATORY_CARE_PROVIDER_SITE_OTHER): Payer: Medicare HMO

## 2017-09-20 DIAGNOSIS — Z95810 Presence of automatic (implantable) cardiac defibrillator: Secondary | ICD-10-CM | POA: Diagnosis not present

## 2017-09-20 DIAGNOSIS — I5042 Chronic combined systolic (congestive) and diastolic (congestive) heart failure: Secondary | ICD-10-CM | POA: Diagnosis not present

## 2017-09-21 ENCOUNTER — Telehealth: Payer: Self-pay

## 2017-09-21 NOTE — Progress Notes (Signed)
EPIC Encounter for ICM Monitoring  Patient Name: Bradley [name] is a 62 y.o. male Date: 09/21/2017 Primary Care Physican: [name], MD Primary Cardiologist:Ross/Bensimhon Electrophysiologist: Caryl Comes Nephrologist:Deterding Dry Weight:194 lbs office weight         Attempted to call wife.   Left message regarding transmission.  Transmission reviewed.    Thoracic impedance normal.  Prescribed dosage: Furosemide 20 mg 1 tablet every Monday and Thursday.   Labs: 07/16/2017 Creatinine 1.16, BUN 21, Potassium 4.7, Sodium 141, EGFR 68.16 06/18/2017 Creatinine 1.15, BUN 25, Potassium 5.4, Sodium 143 03/21/2017 Creatinine 1.27, BUN 18, Potassium 3.9, Sodium 139, EGFR 61.46 01/01/2017 Creatinine 1.39, BUN 22, Potassium 3.6, Sodium 143, EGFR 54->60 10/22/2016 Creatinine 1.09, BUN 20, Potassium 3.8, Sodium 145, EGFR >60  10/04/2016 Creatinine 1.23, BUN 20, Potassium 4.0, Sodium 140 08/09/2016 Creatinine 1.19, BUN 19, Potassium 3.9, Sodium 142, EGFR > 60 07/26/2016 Creatinine 1.13, BUN 17, Potassium 5.5, Sodium 140, EGFR > 60 04/24/2016 Creatinine 1.29, BUN 16, Potassium 5.8, Sodium 139 03/06/2016 Creatinine 1.40, BUN 19, Potassium 5.4, Sodium 140, EGFR 55.2  12/06/2015 Creatinine 1.34, BUN 17, Potassium 5.2, Sodium 140  10/14/2015 Creatinine 1.28, BUN 22, Potassium 4.5, Sodium 140  12/12/2016Creatinine 1.21, BUN 28, Potassium 5.2, Sodium 139**  Recommendations: NONE - Unable to reach.  Follow-up plan: ICM clinic phone appointment on 10/22/2017.   Copy of ICM check sent to Dr. Caryl Comes.   3 month ICM trend: 09/20/2017    1 Year ICM trend:       [name], RN 09/21/2017 2:18 PM

## 2017-09-21 NOTE — Telephone Encounter (Signed)
Remote ICM transmission received.  Attempted call to patient and left message to return call. 

## 2017-09-25 ENCOUNTER — Other Ambulatory Visit (HOSPITAL_COMMUNITY): Payer: Self-pay | Admitting: Internal Medicine

## 2017-10-22 ENCOUNTER — Telehealth: Payer: Self-pay

## 2017-10-22 ENCOUNTER — Ambulatory Visit (INDEPENDENT_AMBULATORY_CARE_PROVIDER_SITE_OTHER): Payer: Medicare HMO

## 2017-10-22 DIAGNOSIS — I5042 Chronic combined systolic (congestive) and diastolic (congestive) heart failure: Secondary | ICD-10-CM | POA: Diagnosis not present

## 2017-10-22 DIAGNOSIS — Z95810 Presence of automatic (implantable) cardiac defibrillator: Secondary | ICD-10-CM

## 2017-10-22 NOTE — Telephone Encounter (Signed)
**Note De-Identified  Obfuscation** The pts wife states that they received a letter from [facility] stating that they have allowed a 30 day supply of Mexiletine but that it is not on the plans formulary.  She is advised that I will look into this and that I will call her back if I have any questions

## 2017-10-22 NOTE — Progress Notes (Signed)
EPIC Encounter for ICM Monitoring  Patient Name: Bradley [name] is a 62 y.o. male Date: 10/22/2017 Primary Care Physican: [name], MD Primary Cardiologist:[facility]: [place] Electrophysiologist: Caryl Comes Nephrologist:Deterding Dry [id number]:076 lbs office weighton 09/19/2017      Attempted call to wife.  Left message to return call.  Transmission reviewed.    Thoracic impedance abnormal suggesting fluid accumulation starting on 10/11/2017 with exception of 2 days at baseline.  Prescribed dosage: Furosemide 20 mg 1 tablet every Monday and Thursday.   Labs: 09/19/2017 Creatinine 1.09, BUN 16, Potassium 4.9, Sodium 141, EGFR >60 08/27/2017 Creatinine 1.25, BUN 25, Potassium 4.9, Sodium 141, EGFR 62-72 07/16/2017 Creatinine 1.16, BUN 21, Potassium 4.7, Sodium 141, EGFR 68.16 06/18/2017 Creatinine 1.15, BUN 25, Potassium 5.4, Sodium 143 03/21/2017 Creatinine 1.27, BUN 18, Potassium 3.9, Sodium 139, EGFR 61.46 01/01/2017 Creatinine 1.39, BUN 22, Potassium 3.6, Sodium 143, EGFR 54->60 10/22/2016 Creatinine 1.09, BUN 20, Potassium 3.8, Sodium 145, EGFR >60  10/04/2016 Creatinine 1.23, BUN 20, Potassium 4.0, Sodium 140 08/09/2016 Creatinine 1.19, BUN 19, Potassium 3.9, Sodium 142, EGFR > 60 07/26/2016 Creatinine 1.13, BUN 17, Potassium 5.5, Sodium 140, EGFR > 60 04/24/2016 Creatinine 1.29, BUN 16, Potassium 5.8, Sodium 139 03/06/2016 Creatinine 1.40, BUN 19, Potassium 5.4, Sodium 140, EGFR 55.2  12/06/2015 Creatinine 1.34, BUN 17, Potassium 5.2, Sodium 140  10/14/2015 Creatinine 1.28, BUN 22, Potassium 4.5, Sodium 140  12/12/2016Creatinine 1.21, BUN 28, Potassium 5.2, Sodium 139  Recommendations: NONE - Unable to reach.  Follow-up plan: ICM clinic phone appointment on 10/30/2017 to recheck fluid levels.    Copy of ICM check sent to Dr. Caryl Comes and Dr. Haroldine Laws for review and recommendations if needed.   3 month ICM trend: 10/22/2017    1 Year ICM trend:        [name], RN 10/22/2017 8:54 AM

## 2017-10-22 NOTE — Telephone Encounter (Signed)
Remote ICM transmission received.  Attempted call to wife and left message per DPR to return call regarding transmission.

## 2017-10-23 DIAGNOSIS — I5042 Chronic combined systolic (congestive) and diastolic (congestive) heart failure: Secondary | ICD-10-CM | POA: Diagnosis not present

## 2017-10-24 NOTE — Telephone Encounter (Signed)
**Note De-Identified  Obfuscation** I have done a Mexiletine PA through covermymeds.

## 2017-10-26 NOTE — Telephone Encounter (Signed)
New message   Prior authorization for for Mexiletine 1 50mg  capsule, please call

## 2017-10-29 NOTE — Telephone Encounter (Signed)
**Note De-Identified  Obfuscation** I have completed another form (PA request) that was faxed to [place] from [facility] concerning the pts Dofetilide. Dr Harrington Challenger has signed and I have faxed back to [facility].

## 2017-10-29 NOTE — Telephone Encounter (Addendum)
**Note De-Identified Bradley [name] Obfuscation** I have called [employer] this am but was on hold for than 15 mins. I then completed an appeal form on the denial of Mexiletine received Bradley [name] fax from [facility].

## 2017-10-30 ENCOUNTER — Ambulatory Visit (INDEPENDENT_AMBULATORY_CARE_PROVIDER_SITE_OTHER): Payer: Self-pay

## 2017-10-30 DIAGNOSIS — Z95810 Presence of automatic (implantable) cardiac defibrillator: Secondary | ICD-10-CM

## 2017-10-30 DIAGNOSIS — I5042 Chronic combined systolic (congestive) and diastolic (congestive) heart failure: Secondary | ICD-10-CM

## 2017-10-30 NOTE — Progress Notes (Signed)
EPIC Encounter for ICM Monitoring  Patient Name: Bradley [name] is a 62 y.o. male Date: 10/30/2017 Primary Care Physican: [name], MD Primary Cardiologist:[facility]: [place] Electrophysiologist: Caryl Comes Nephrologist:Deterding Dry Weight:186 lbs  Heart Failure questions reviewed, pt asymptomatic now but feeling very sluggish last week during decreased impedance.  He ate a lot of chinese food last week but feels better now.    Thoracic impedance returned to normal since 10/22/2017 ICM transmission.  Prescribed dosage: Furosemide 20 mg 1 tablet every Monday and Thursday.   Labs: 09/19/2017 Creatinine 1.09, BUN 16, Potassium 4.9, Sodium 141, EGFR >60 08/27/2017 Creatinine 1.25, BUN 25, Potassium 4.9, Sodium 141, EGFR 62-72 07/16/2017 Creatinine 1.16, BUN 21, Potassium 4.7, Sodium 141, EGFR 68.16 06/18/2017 Creatinine 1.15, BUN 25, Potassium 5.4, Sodium 143 03/21/2017 Creatinine 1.27, BUN 18, Potassium 3.9, Sodium 139, EGFR 61.46 01/01/2017 Creatinine 1.39, BUN 22, Potassium 3.6, Sodium 143, EGFR 54->60 10/22/2016 Creatinine 1.09, BUN 20, Potassium 3.8, Sodium 145, EGFR >60  10/04/2016 Creatinine 1.23, BUN 20, Potassium 4.0, Sodium 140 08/09/2016 Creatinine 1.19, BUN 19, Potassium 3.9, Sodium 142, EGFR > 60 07/26/2016 Creatinine 1.13, BUN 17, Potassium 5.5, Sodium 140, EGFR > 60 04/24/2016 Creatinine 1.29, BUN 16, Potassium 5.8, Sodium 139 03/06/2016 Creatinine 1.40, BUN 19, Potassium 5.4, Sodium 140, EGFR 55.2  12/06/2015 Creatinine 1.34, BUN 17, Potassium 5.2, Sodium 140  10/14/2015 Creatinine 1.28, BUN 22, Potassium 4.5, Sodium 140  12/12/2016Creatinine 1.21, BUN 28, Potassium 5.2, Sodium 139  Recommendations:  No changes today.  Advised [place] food will cause fluid retention due to high salt.  Encouraged to call for fluid symptoms.  Follow-up plan: ICM clinic phone appointment on 11/22/2017.    Copy of ICM check sent to Dr. Caryl Comes.   3 month ICM  trend: 10/30/2017    1 Year ICM trend:       [name], RN 10/30/2017 10:40 AM

## 2017-10-31 NOTE — Telephone Encounter (Signed)
**Note De-Identified  Obfuscation** We received a letter from [facility] today concerning the pts PA for Mexiletine. They are asking if the pt has tired and failed Propafenone or Flecainide prior to taking Mexiletine. Per the pts med list he has not  Per letter if the pt has not tired and failed Propafenone and Flecainide they will need [place] to: 1. Provide Patient Specific clinical rational for why each formulary alternative would not work. and 2. Provide office notes to support the formulary alternatives that have been previouly tried, why they were failed, or why they cannot be taken. Or 3. Do you want to withdraw the appeal and use one of the formulary alternatives?  We must reply by 4/8 at 4 pm or this appeal will be denied.  Please advise.

## 2017-10-31 NOTE — Telephone Encounter (Signed)
**Note De-Identified  Obfuscation** Will wait for Dr Harrington Challenger to respond as she will be in the office tomorrow.

## 2017-10-31 NOTE — Telephone Encounter (Signed)
He has structural heart disease   Propafenone and Flecanide are contraindicated in this state He needs Mexilitene because of VT

## 2017-10-31 NOTE — Telephone Encounter (Signed)
After researching, I found mexiletine was ordered during a hospital admission during June of 2016. It is unclear to me who wrote for it and why they wrote for it instead of flecainide or profanenone. I am not sure how to answer the questions being asked by the the patients insurance company.

## 2017-11-01 NOTE — Telephone Encounter (Signed)
I faxed Dr Harrington Challenger explanation regarding approval of patients MEXILETINE to [facility].

## 2017-11-05 NOTE — Telephone Encounter (Signed)
**Note De-Identified Adonai Selsor Obfuscation** Mexiletine approval received Breta Demedeiros fax from [facility]. Approval good until 07/30/18.  I have notified the pts pharmacy.

## 2017-11-08 NOTE — Telephone Encounter (Signed)
Noted  

## 2017-11-15 ENCOUNTER — Telehealth (HOSPITAL_COMMUNITY): Payer: Self-pay | Admitting: Vascular Surgery

## 2017-11-15 NOTE — Telephone Encounter (Signed)
PT message to reschedule echo and f/u w. DB , Due to DB not being in office 6/19

## 2017-11-22 ENCOUNTER — Ambulatory Visit (INDEPENDENT_AMBULATORY_CARE_PROVIDER_SITE_OTHER): Payer: Medicare HMO | Admitting: *Deleted

## 2017-11-22 DIAGNOSIS — Z95810 Presence of automatic (implantable) cardiac defibrillator: Secondary | ICD-10-CM

## 2017-11-22 DIAGNOSIS — I5042 Chronic combined systolic (congestive) and diastolic (congestive) heart failure: Secondary | ICD-10-CM | POA: Diagnosis not present

## 2017-11-22 DIAGNOSIS — I472 Ventricular tachycardia, unspecified: Secondary | ICD-10-CM

## 2017-11-22 NOTE — Progress Notes (Signed)
EPIC Encounter for ICM Monitoring  Patient Name: Bradley [name] is a 61 y.o. male Date: 11/22/2017 Primary Care Physican: [name], MD Primary Cardiologist:[facility]:[place] Electrophysiologist: Caryl Comes Nephrologist:Deterding Dry Weight:Previous weight 186 lbs      Spoke with wife, patient not home. Heart Failure questions reviewed, patient's only complaint is tiredness.  He has visit with Dr Haroldine Laws in June and advised to discuss at that time. .   Thoracic impedance normal.  Prescribed dosage: Furosemide 20 mg 1 tablet every Monday and Thursday.   Labs: 09/19/2017 Creatinine 1.09, BUN 16, Potassium 4.9, Sodium 141, EGFR >60 08/27/2017 Creatinine 1.25, BUN 25, Potassium 4.9, Sodium 141, EGFR 62-72 07/16/2017 Creatinine 1.16, BUN 21, Potassium 4.7, Sodium 141, EGFR 68.16 06/18/2017 Creatinine 1.15, BUN 25, Potassium 5.4, Sodium 143 03/21/2017 Creatinine 1.27, BUN 18, Potassium 3.9, Sodium 139, EGFR 61.46 01/01/2017 Creatinine 1.39, BUN 22, Potassium 3.6, Sodium 143, EGFR 54->60 10/22/2016 Creatinine 1.09, BUN 20, Potassium 3.8, Sodium 145, EGFR >60  10/04/2016 Creatinine 1.23, BUN 20, Potassium 4.0, Sodium 140 08/09/2016 Creatinine 1.19, BUN 19, Potassium 3.9, Sodium 142, EGFR > 60 07/26/2016 Creatinine 1.13, BUN 17, Potassium 5.5, Sodium 140, EGFR > 60 04/24/2016 Creatinine 1.29, BUN 16, Potassium 5.8, Sodium 139 03/06/2016 Creatinine 1.40, BUN 19, Potassium 5.4, Sodium 140, EGFR 55.2  12/06/2015 Creatinine 1.34, BUN 17, Potassium 5.2, Sodium 140  10/14/2015 Creatinine 1.28, BUN 22, Potassium 4.5, Sodium 140  12/12/2016Creatinine 1.21, BUN 28, Potassium 5.2, Sodium 139  Recommendations: No changes.  Encouraged to call for fluid symptoms.  Follow-up plan: ICM clinic phone appointment on 12/25/2017.  Office appointment scheduled 01/16/2018 with Dr. Haroldine Laws.  Copy of ICM check sent to Dr. Caryl Comes.   3 month ICM trend: 11/22/2017    1 Year ICM  trend:       [name], RN 11/22/2017 10:27 AM

## 2017-11-23 ENCOUNTER — Encounter: Payer: Self-pay | Admitting: Cardiology

## 2017-11-23 NOTE — Progress Notes (Signed)
Remote ICD transmission.   

## 2017-12-09 ENCOUNTER — Other Ambulatory Visit: Payer: Self-pay | Admitting: Internal Medicine

## 2017-12-14 LAB — CUP PACEART REMOTE DEVICE CHECK
Battery Remaining Longevity: 69 mo
Battery Remaining Percentage: 67 %
Battery Voltage: 2.95 V
Brady Statistic RV Percent Paced: 1 %
Date Time Interrogation Session: 20190425094934
HighPow Impedance: 71 Ohm
HighPow Impedance: 71 Ohm
Implantable Lead Implant Date: 20160603
Implantable Lead Location: 753860
Implantable Lead Model: 7122
Implantable Pulse Generator Implant Date: 20160603
Lead Channel Impedance Value: 380 Ohm
Lead Channel Pacing Threshold Amplitude: 1.25 V
Lead Channel Pacing Threshold Pulse Width: 0.5 ms
Lead Channel Sensing Intrinsic Amplitude: 11.7 mV
Lead Channel Setting Pacing Amplitude: 2.5 V
Lead Channel Setting Pacing Pulse Width: 0.5 ms
Lead Channel Setting Sensing Sensitivity: 0.5 mV
Pulse Gen Serial Number: 7135169

## 2017-12-23 DIAGNOSIS — I5042 Chronic combined systolic (congestive) and diastolic (congestive) heart failure: Secondary | ICD-10-CM | POA: Diagnosis not present

## 2017-12-25 ENCOUNTER — Telehealth: Payer: Self-pay

## 2017-12-25 ENCOUNTER — Ambulatory Visit (INDEPENDENT_AMBULATORY_CARE_PROVIDER_SITE_OTHER): Payer: Medicare HMO

## 2017-12-25 DIAGNOSIS — I5042 Chronic combined systolic (congestive) and diastolic (congestive) heart failure: Secondary | ICD-10-CM | POA: Diagnosis not present

## 2017-12-25 DIAGNOSIS — Z95810 Presence of automatic (implantable) cardiac defibrillator: Secondary | ICD-10-CM | POA: Diagnosis not present

## 2017-12-25 NOTE — Telephone Encounter (Signed)
Remote ICM transmission received.  Attempted call to wife and person answering phone stated the patient and wife were not home.

## 2017-12-25 NOTE — Progress Notes (Signed)
EPIC Encounter for ICM Monitoring  Patient Name: Bradley [name] is a 62 y.o. male Date: 12/25/2017 Primary Care Physican: [name], MD Primary Cardiologist:[facility]:[place] Electrophysiologist: Caryl Comes Nephrologist:Deterding Dry Weight:Previous weight 186 lbs      Attempted call to wife and unable to reach.   Transmission reviewed.    Thoracic impedance normal but was abnormal suggesting fluid accumulation from 11/28/2017 - 12/11/2017.  Prescribed dosage: Furosemide 20 mg 1 tablet every Monday and Thursday.   Labs: 09/19/2017 Creatinine 1.09, BUN 16, Potassium 4.9, Sodium 141, EGFR >60 08/27/2017 Creatinine 1.25, BUN 25, Potassium 4.9, Sodium 141, EGFR 62-72 07/16/2017 Creatinine 1.16, BUN 21, Potassium 4.7, Sodium 141, EGFR 68.16 06/18/2017 Creatinine 1.15, BUN 25, Potassium 5.4, Sodium 143 03/21/2017 Creatinine 1.27, BUN 18, Potassium 3.9, Sodium 139, EGFR 61.46 01/01/2017 Creatinine 1.39, BUN 22, Potassium 3.6, Sodium 143, EGFR 54->60 10/22/2016 Creatinine 1.09, BUN 20, Potassium 3.8, Sodium 145, EGFR >60  10/04/2016 Creatinine 1.23, BUN 20, Potassium 4.0, Sodium 140 08/09/2016 Creatinine 1.19, BUN 19, Potassium 3.9, Sodium 142, EGFR > 60 07/26/2016 Creatinine 1.13, BUN 17, Potassium 5.5, Sodium 140, EGFR > 60 04/24/2016 Creatinine 1.29, BUN 16, Potassium 5.8, Sodium 139 03/06/2016 Creatinine 1.40, BUN 19, Potassium 5.4, Sodium 140, EGFR 55.2  12/06/2015 Creatinine 1.34, BUN 17, Potassium 5.2, Sodium 140  10/14/2015 Creatinine 1.28, BUN 22, Potassium 4.5, Sodium 140  12/12/2016Creatinine 1.21, BUN 28, Potassium 5.2, Sodium 139  Recommendations:  NONE - Unable to reach.  Follow-up plan: ICM clinic phone appointment on 01/25/2018.  Due to reschedule office visit with Dr Haroldine Laws since June appointment was canceled by HF clinic.   Copy of ICM check sent to Dr. Caryl Comes.   3 month ICM trend: 12/25/2017    1 Year ICM trend:       [name], RN 12/25/2017 11:16 AM

## 2018-01-16 ENCOUNTER — Encounter (HOSPITAL_COMMUNITY): Payer: Medicare HMO | Admitting: Internal Medicine

## 2018-01-16 ENCOUNTER — Other Ambulatory Visit (HOSPITAL_COMMUNITY): Payer: Medicare HMO

## 2018-01-23 DIAGNOSIS — I5042 Chronic combined systolic (congestive) and diastolic (congestive) heart failure: Secondary | ICD-10-CM | POA: Diagnosis not present

## 2018-01-25 ENCOUNTER — Ambulatory Visit (INDEPENDENT_AMBULATORY_CARE_PROVIDER_SITE_OTHER): Payer: Medicare HMO

## 2018-01-25 ENCOUNTER — Telehealth: Payer: Self-pay

## 2018-01-25 DIAGNOSIS — I5042 Chronic combined systolic (congestive) and diastolic (congestive) heart failure: Secondary | ICD-10-CM

## 2018-01-25 DIAGNOSIS — Z95810 Presence of automatic (implantable) cardiac defibrillator: Secondary | ICD-10-CM | POA: Diagnosis not present

## 2018-01-25 NOTE — Progress Notes (Signed)
EPIC Encounter for ICM Monitoring  Patient Name: Bradley [name] is a 62 y.o. male Date: 01/25/2018 Primary Care Physican: [name], MD Primary Cardiologist:[facility]:[place] Electrophysiologist: Caryl Comes Nephrologist:Deterding Dry Weight:Previous [id number] lbs                                                          Attempted call to wife and unable to reach.    Left detailed message regarding transmission.  Transmission reviewed   Thoracic impedance normal but was abnormal suggesting fluid accumulation from 01/02/2018 - 01/18/2018.  Prescribed dosage: Furosemide 20 mg 1 tablet every Monday and Thursday.   Labs: 09/19/2017 Creatinine 1.09, BUN 16, Potassium 4.9, Sodium 141, EGFR >60 08/27/2017 Creatinine 1.25, BUN 25, Potassium 4.9, Sodium 141, EGFR 62-72 07/16/2017 Creatinine 1.16, BUN 21, Potassium 4.7, Sodium 141, EGFR 68.16 06/18/2017 Creatinine 1.15, BUN 25, Potassium 5.4, Sodium 143 03/21/2017 Creatinine 1.27, BUN 18, Potassium 3.9, Sodium 139, EGFR 61.46 01/01/2017 Creatinine 1.39, BUN 22, Potassium 3.6, Sodium 143, EGFR 54->60 10/22/2016 Creatinine 1.09, BUN 20, Potassium 3.8, Sodium 145, EGFR >60  10/04/2016 Creatinine 1.23, BUN 20, Potassium 4.0, Sodium 140 08/09/2016 Creatinine 1.19, BUN 19, Potassium 3.9, Sodium 142, EGFR > 60  Recommendations: Left voice mail with ICM number and encouraged to call if experiencing any fluid symptoms.  Follow-up plan: ICM clinic phone appointment on 02/28/2018.  Office appointment scheduled 02/20/2018 with Dr. Haroldine Laws.   Recall appt 06/18/2018 with Dr. Caryl Comes.  Copy of ICM check sent to Dr. Caryl Comes.   3 month ICM trend: 01/25/2018    1 Year ICM trend:       [name], RN 01/25/2018 8:32 AM

## 2018-01-25 NOTE — Telephone Encounter (Signed)
Remote ICM transmission received.  Attempted call to wife and left detailed message, per DPR, regarding transmission and next ICM scheduled for 02/28/2018.  Advised to return call for any fluid symptoms or questions.

## 2018-01-28 ENCOUNTER — Other Ambulatory Visit: Payer: Self-pay | Admitting: Internal Medicine

## 2018-02-12 ENCOUNTER — Other Ambulatory Visit: Payer: Self-pay | Admitting: Family Medicine

## 2018-02-20 ENCOUNTER — Ambulatory Visit (HOSPITAL_BASED_OUTPATIENT_CLINIC_OR_DEPARTMENT_OTHER)
Admission: RE | Admit: 2018-02-20 | Discharge: 2018-02-20 | Disposition: A | Discharge status: Home / Self Care | Payer: Medicare HMO | Source: Ambulatory Visit | Attending: Internal Medicine | Admitting: Internal Medicine

## 2018-02-20 ENCOUNTER — Ambulatory Visit (HOSPITAL_COMMUNITY)
Admission: RE | Admit: 2018-02-20 | Discharge: 2018-02-20 | Disposition: A | Discharge status: Home / Self Care | Payer: Medicare HMO | Source: Ambulatory Visit | Attending: Internal Medicine | Admitting: Internal Medicine

## 2018-02-20 VITALS — BP 144/82 | HR 54 | Wt 184.0 lb

## 2018-02-20 DIAGNOSIS — Z85850 Personal history of malignant neoplasm of thyroid: Secondary | ICD-10-CM | POA: Diagnosis not present

## 2018-02-20 DIAGNOSIS — I5042 Chronic combined systolic (congestive) and diastolic (congestive) heart failure: Secondary | ICD-10-CM

## 2018-02-20 DIAGNOSIS — Z992 Dependence on renal dialysis: Secondary | ICD-10-CM | POA: Insufficient documentation

## 2018-02-20 DIAGNOSIS — Z808 Family history of malignant neoplasm of other organs or systems: Secondary | ICD-10-CM | POA: Insufficient documentation

## 2018-02-20 DIAGNOSIS — Z885 Allergy status to narcotic agent status: Secondary | ICD-10-CM | POA: Insufficient documentation

## 2018-02-20 DIAGNOSIS — I38 Endocarditis, valve unspecified: Secondary | ICD-10-CM | POA: Diagnosis not present

## 2018-02-20 DIAGNOSIS — F329 Major depressive disorder, single episode, unspecified: Secondary | ICD-10-CM | POA: Diagnosis not present

## 2018-02-20 DIAGNOSIS — I2510 Atherosclerotic heart disease of native coronary artery without angina pectoris: Secondary | ICD-10-CM | POA: Insufficient documentation

## 2018-02-20 DIAGNOSIS — I340 Nonrheumatic mitral (valve) insufficiency: Secondary | ICD-10-CM | POA: Diagnosis not present

## 2018-02-20 DIAGNOSIS — Z7982 Long term (current) use of aspirin: Secondary | ICD-10-CM | POA: Insufficient documentation

## 2018-02-20 DIAGNOSIS — N186 End stage renal disease: Secondary | ICD-10-CM | POA: Insufficient documentation

## 2018-02-20 DIAGNOSIS — I132 Hypertensive heart and chronic kidney disease with heart failure and with stage 5 chronic kidney disease, or end stage renal disease: Secondary | ICD-10-CM | POA: Diagnosis not present

## 2018-02-20 DIAGNOSIS — I219 Acute myocardial infarction, unspecified: Secondary | ICD-10-CM | POA: Insufficient documentation

## 2018-02-20 DIAGNOSIS — E890 Postprocedural hypothyroidism: Secondary | ICD-10-CM | POA: Insufficient documentation

## 2018-02-20 DIAGNOSIS — E875 Hyperkalemia: Secondary | ICD-10-CM | POA: Diagnosis not present

## 2018-02-20 DIAGNOSIS — I472 Ventricular tachycardia, unspecified: Secondary | ICD-10-CM

## 2018-02-20 DIAGNOSIS — Z79899 Other long term (current) drug therapy: Secondary | ICD-10-CM | POA: Diagnosis not present

## 2018-02-20 DIAGNOSIS — I10 Essential (primary) hypertension: Secondary | ICD-10-CM | POA: Diagnosis not present

## 2018-02-20 DIAGNOSIS — I429 Cardiomyopathy, unspecified: Secondary | ICD-10-CM | POA: Insufficient documentation

## 2018-02-20 DIAGNOSIS — I359 Nonrheumatic aortic valve disorder, unspecified: Secondary | ICD-10-CM

## 2018-02-20 DIAGNOSIS — Z953 Presence of xenogenic heart valve: Secondary | ICD-10-CM | POA: Diagnosis not present

## 2018-02-20 LAB — BASIC METABOLIC PANEL
Anion gap: 5 (ref 5–15)
BUN: 16 mg/dL (ref 8–23)
CO2: 28 mmol/L (ref 22–32)
Calcium: 9.1 mg/dL (ref 8.9–10.3)
Chloride: 108 mmol/L (ref 98–111)
Creatinine, Ser: 1.11 mg/dL (ref 0.61–1.24)
GFR calc Af Amer: >60 mL/min (ref >60)
GFR calc non Af Amer: >60 mL/min (ref >60)
Glucose, Bld: 101 mg/dL — ABNORMAL HIGH (ref 70–99)
Potassium: 4.8 mmol/L (ref 3.5–5.1)
Sodium: 141 mmol/L (ref 135–145)

## 2018-02-20 NOTE — Progress Notes (Signed)
Patient ID: Bradley [name], male   DOB: Mar 14, 1956, 62 y.o.   MRN: [id number]    Advanced Heart Failure Clinic Note   Referring Physician: Chanetta Marshall, NP Primary Care: Dr [name] Primary Cardiologist: Dr Dorris Carnes Primary HF: Dr. Haroldine Laws  HPI: Bradley [name] is a 62 y.o. male with a complicated past medical history including thyroid cancer (s/p thyroidectomy), hypertension, depression, and congenital aortic stenosis for which he underwent aortic vavulotomy at age 65 with subsequent Bentall procedure in 2001 with a pericardial tissue valve. He had MSSA bacteremia and bacterial endocarditis in 09/2023 complicated by renal failure requiring dialysis for a short period of time. 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   He was admitted 11/2014 with progressive episodes of palpitations and pre-syncope. He was found to have monomorphic ventricular tachycardia. Catheterization demonstrated normal coronaries. Echocardiogram demonstrated EF 30-35% and he underwent STJ dual chamber ICD implant. Mexiletine was added prior to discharge and his Metoprolol was changed to Coreg. He had subsequent ventricular arrhythmias and his Mexelitine was increased with no further VT.  CPX test performed 10/25/15 showed a moderate functional impairment (pVO2 16.1). Resting spirometry reveals mild obstruction/restriction. There was also evidence of chronotropic incompetence.   He presents today for regular follow up. No changes at last visit. Overall feeling quite well. 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 BP runs in 110-120s at home, 130s this am and last night but said he didn't feel well this am.   He has occasional SOB with stairs. Nothing with ADLs. Denies peripheral edema, orthopnea, or PND. Taking lasix Mon/Thursday, hasn't taken any extra. Took medicines this am. Feels pretty  good overall. Has lost 10 pounds since February.Watching his diet. No CP or palpitations. No ICD firings.    Echo today shows 25-30% AVR stable. Personally reviewed   Echo 5/18 EF 25-30%   ICD interrogated personally: Impedance ok.  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 Lives at home with wife and 3/4 of his kids. 2 sons and 2 daughters.   CPX 01/01/17  FEV1 3.40 (86%)     FEV1/FVC 83 (107%)     MVV 163 (108%)     Exercise Time:  11:30  Watts: 132  Resting HR: 72 Peak HR: 129  (80% age predicted max HR) BP rest: 118/70 BP peak: 174/84  Peak VO2: 21.1 (73% predicted peak VO2) VE/VCO2 slope: 35 OUES: 1.96 Peak RER: 1.22 Ventilatory Threshold: 16.6 (58% predicted or measured peak VO2) VE/MVV: 50% O2pulse: 15  (100% predicted O2pulse)  CPX 10/25/15 FVC 3.47 (66%) FEV1 2.86 (71%) Resting HR 60 Peak 112 Resting BP 130/80 Peak 190/88 Peak V02 16.1 (55.2%) KY/HC62 slope: 35 OUES: 1.69 Peak RER 1.13 Ventilatory threshold 10.9 (37.4%) Peak RR 40 Peak Ventilation 72.2 VE/MVV 51% PETCO2 at peak: 31 O2 pulse 13 (81%)  Echo 04/2013 LVEF 60-65% Echo 08/2013 LVEF 45-50% Echo 11/2013 LVEF 20-25% Echo 02/2014 LVEF 35-40% Echo 12/30/14 LVEF 30-35%, Mild MR, Mod LAE, RV mildly dilated, Mild RAE Echo 5/17 25-30%  ECHO 12/21/2016 LVEF 25-30%   Past Medical History:  Diagnosis Date  . Anxiety   . Aortic stenosis    s/p Bentall with bioprosthetic AVR 02/2010; Last echo (9/11): Moderate LVH, EF 45-50%, AVR functioning appropriately, aortic valve mean gradient 21, diastolic dysfunction. Chest MRA (2/13): Mild to moderate dilatation at the sinus of Valsalva at 4.1 cm,  mild dilatation ascending aorta distal to the tube graft at 3.9 cm, moderate dilatation of the innominate artery a 2.1 cm;    . Depression   . ESRD on dialysis [facility])    a. 09/2013 felt to be related to gentamycin b. no longer requiring HD  . Hx of cardiac cath    a.  LHC in 02/2010: normal cors  . Hx of echocardiogram 02/2014   Echo (8/15):  Mod LV, EF 35-40%, Gr 1 DD, AVR ok (mean 14 mmHg), mild LAE  . Hypertension   . Hypothyroidism, postsurgical   . Prosthetic valve endocarditis ([place])   . Staphylococcus aureus bacteremia   . Thyroid cancer ([place])   . Ventricular tachycardia ([place])    a. s/p STJ ICD    Current Outpatient Medications  Medication Sig Dispense Refill  . aspirin EC 81 MG tablet Take 81 mg by mouth daily.     Marland Kitchen atorvastatin (LIPITOR) 40 MG tablet Take 1 tablet (40 mg total) by mouth daily. 90 tablet 3  . betamethasone valerate ointment (VALISONE) 0.1 % APPLY  OINTMENT TO AFFECTED AREA TWICE DAILY 30 g 2  . carvedilol (COREG) 6.25 MG tablet Take 1 tablet (6.25 mg total) by mouth 2 (two) times daily with a meal. 180 tablet 3  . clonazePAM (KLONOPIN) 1 MG tablet TAKE ONE TABLET BY MOUTH TWICE DAILY AS NEEDED NOT  FOR  REGULAR  USE 30 tablet 0  . docusate sodium (STOOL SOFTENER) 100 MG capsule Take 100 mg by mouth 2 (two) times daily.    Marland Kitchen ENSURE (ENSURE) Take 237 mLs by mouth daily as needed. 237 mL 12  . ENTRESTO 97-103 MG TAKE 1 TABLET BY MOUTH 2 (TWO) TIMES DAILY. 60 tablet 6  . furosemide (LASIX) 20 MG tablet TAKE 1 TABLET BY MOUTH EVERY MONDAY AND THURSDAY 25 tablet 3  . levothyroxine (SYNTHROID, LEVOTHROID) 175 MCG tablet TAKE 1 TABLET (175 MCG TOTAL) BY MOUTH DAILY BEFORE BREAKFAST. 90 tablet 3  . mexiletine (MEXITIL) 150 MG capsule TAKE 2 CAPSULES BY MOUTH EVERY MORNING AND 2 CAPSULES BY MOUTH EVERY EVENING 120 capsule 7  . ranolazine (RANEXA) 500 MG 12 hr tablet TAKE 1 TABLET BY MOUTH TWICE A DAY 180 tablet 1  . sertraline (ZOLOFT) 50 MG tablet Take 1 tablet (50 mg total) by mouth daily. 90 tablet 3   No current facility-administered medications for this encounter.     Allergies  Allergen Reactions  . Oxycodone Other (See Comments)    Gives patient nightmares  . Rifampin Nausea Only      Social History   Socioeconomic  History  . Marital status: Married    Spouse name: Not on file  . Number of children: Not on file  . Years of education: Not on file  . Highest education level: Not on file  Occupational History  . Not on file  Social Needs  . Financial resource strain: Not on file  . Food insecurity:    Worry: Not on file    Inability: Not on file  . Transportation needs:    Medical: Not on file    Non-medical: Not on file  Tobacco Use  . Smoking status: Never Smoker  . Smokeless tobacco: Never Used  Substance and Sexual Activity  . Alcohol use: No    Alcohol/week: 0.0 oz    Comment: Occasional  . Drug use: No  . Sexual activity: Never  Lifestyle  . Physical activity:    Days per week: Not on  file    Minutes per session: Not on file  . Stress: Not on file  Relationships  . Social connections:    Talks on phone: Not on file    Gets together: Not on file    Attends religious service: Not on file    Active member of club or organization: Not on file    Attends meetings of clubs or organizations: Not on file    Relationship status: Not on file  . Intimate partner violence:    Fear of current or ex partner: Not on file    Emotionally abused: Not on file    Physically abused: Not on file    Forced sexual activity: Not on file  Other Topics Concern  . Not on file  Social History Narrative   Lives at home with walker.  Does not drive.  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 Hypertension Mother   . Heart disease Father   . Stroke Father   . Hypertension Father   . Thyroid cancer Sister   . Thyroid cancer Sister   . Kidney Stones Sister   . Heart attack Neg Hx     Vitals:   02/20/18 1401  BP: (!) 144/82  Pulse: (!) 54  SpO2: 100%  Weight: 184 lb (83.5 kg)   Wt Readings from Last 3 Encounters:  02/20/18 184 lb (83.5 kg)  09/19/17 194 lb 1.9 oz (88.1 kg)  08/27/17 191 lb 12.8 oz (87 kg)     PHYSICAL  EXAM: General: Well appearing. No resp difficulty. HEENT: Normal Anicteric Neck: Supple. JVP 5-6. Carotids 2+ bilat; no bruits. No thyromegaly or nodule noted. Cor: PMI nondisplaced. RRR, 2/6 SEM RUSB.  Lungs: CTAB, normal effort. No wheeze Abdomen: Soft, non-tender, non-distended, no HSM. No bruits or masses. +BS  Extremities: no cyanosis, clubbing, rash, edema Neuro: alert & oriented x 3, cranial nerves grossly intact. moves all 4 extremities w/o difficulty. Affect pleasant  . ASSESSMENT & PLAN:  1. Chronic systolic HF  - NICM St Jude  Echo 5/17 LVEF 25-30%, Mild MR, Mod LAE, RV mildly dilated, Mild RAE. ECHO 12/21/2016 LVEF 25-30%. IW AK RV mild-mod  - Echo today 02/20/18 shows 25-30% AVR stable. Personally reviewed - NYHA II-early III symptoms - Volume status stable on exam.   - March 2017 CPX with moderate HF limitation.  - CPX 6/18 much improved  - Had to stop spiro due to low BP and hyperkalemia - ContinueEntresto 97/103 mg BID.  - Continue coreg 6.25 mg BID. Unable to titrate due to low HR - Continue digoxin 0.125 mg daily. Check level today. Was 0.9 08/2017 (not trough) 2. VT/NICM - On mexilitene/Ranexa - No VT/VF on ICD. Interrogated personally in clinic.  3. HTN - Elevated on arrival today. Meds as above.  4. Valvular heart disease - s/p bentall in 2001 and re-do bentall 2015. Stable on recent echo  - Reinforced need for SBE with all procedures and dental cleanings 5. Depression - Stable.   Bradley Friar, PA-C  2:06 PM   Patient seen and examined with the above-signed Advanced Practice Provider and/or Housestaff. I personally reviewed laboratory data, imaging studies and relevant notes. I independently examined the patient and formulated the important aspects of the plan. I have edited the note to reflect any of my changes or salient points. I have personally discussed the plan with the patient and/or  family.  Echo reviewed personally. EF 25-30%. Volume  status looks good on exam and by ICD interrogation. No further VT. On good meds. Will not titrate with previous intolerance of higher doses. Continue mexilitene/Ranexa. Reinforced need for SBE prophylaxis.   Bradley Bickers, MD  10:51 PM

## 2018-02-20 NOTE — Patient Instructions (Signed)
Lab today  We will contact you in 6 months to schedule your next appointment.  

## 2018-02-20 NOTE — Progress Notes (Signed)
  Echocardiogram 2D Echocardiogram has been performed.  Bradley [name] 02/20/2018, 1:46 PM

## 2018-02-28 ENCOUNTER — Ambulatory Visit (INDEPENDENT_AMBULATORY_CARE_PROVIDER_SITE_OTHER): Payer: Medicare HMO

## 2018-02-28 ENCOUNTER — Encounter: Payer: Self-pay | Admitting: Cardiology

## 2018-02-28 ENCOUNTER — Ambulatory Visit (INDEPENDENT_AMBULATORY_CARE_PROVIDER_SITE_OTHER): Payer: Medicare HMO | Admitting: *Deleted

## 2018-02-28 DIAGNOSIS — I472 Ventricular tachycardia, unspecified: Secondary | ICD-10-CM

## 2018-02-28 DIAGNOSIS — Z95810 Presence of automatic (implantable) cardiac defibrillator: Secondary | ICD-10-CM

## 2018-02-28 DIAGNOSIS — I5042 Chronic combined systolic (congestive) and diastolic (congestive) heart failure: Secondary | ICD-10-CM

## 2018-02-28 NOTE — Progress Notes (Signed)
EPIC Encounter for ICM Monitoring  Patient Name: Bradley [name] is a 62 y.o. male Date: 02/28/2018 Primary Care Physican: [name], MD Primary Cardiologist:[facility] Heart Failure Clinic:[facility] Electrophysiologist: Caryl Comes Nephrologist:Deterding Dry Weight:184 lbs       Heart Failure questions reviewed, pt asymptomatic.  He has lost 10 lbs since January and goal weight is 182 lbs. He ate [place] food during time of decreased impedance.    Thoracic impedance normal.  Prescribed dosage: Furosemide 20 mg 1 tablet every Monday and Thursday.   Labs: 09/19/2017 Creatinine 1.09, BUN 16, Potassium 4.9, Sodium 141, EGFR >60 08/27/2017 Creatinine 1.25, BUN 25, Potassium 4.9, Sodium 141, EGFR 62-72 07/16/2017 Creatinine 1.16, BUN 21, Potassium 4.7, Sodium 141, EGFR 68.16 06/18/2017 Creatinine 1.15, BUN 25, Potassium 5.4, Sodium 143 03/21/2017 Creatinine 1.27, BUN 18, Potassium 3.9, Sodium 139, EGFR 61.46 01/01/2017 Creatinine 1.39, BUN 22, Potassium 3.6, Sodium 143, EGFR 54->60 10/22/2016 Creatinine 1.09, BUN 20, Potassium 3.8, Sodium 145, EGFR >60  10/04/2016 Creatinine 1.23, BUN 20, Potassium 4.0, Sodium 140 08/09/2016 Creatinine 1.19, BUN 19, Potassium 3.9, Sodium 142, EGFR > 60  Recommendations: No changes.  Encouraged to call for fluid symptoms.  Follow-up plan: ICM clinic phone appointment on 04/02/2018.    Copy of ICM check sent to Dr. Caryl Comes.   3 month ICM trend: 02/28/2018    1 Year ICM trend:       [name], RN 02/28/2018 4:12 PM

## 2018-02-28 NOTE — Progress Notes (Signed)
Remote ICD transmission.   

## 2018-03-26 ENCOUNTER — Other Ambulatory Visit: Payer: Self-pay | Admitting: *Deleted

## 2018-03-26 MED ORDER — ENSURE PO LIQD: 237.0000 mL | Freq: Every day | ORAL | 12 refills | Status: DC | PRN

## 2018-03-27 DIAGNOSIS — I5042 Chronic combined systolic (congestive) and diastolic (congestive) heart failure: Secondary | ICD-10-CM | POA: Diagnosis not present

## 2018-04-02 ENCOUNTER — Ambulatory Visit (INDEPENDENT_AMBULATORY_CARE_PROVIDER_SITE_OTHER): Payer: Medicare HMO

## 2018-04-02 DIAGNOSIS — I5042 Chronic combined systolic (congestive) and diastolic (congestive) heart failure: Secondary | ICD-10-CM

## 2018-04-02 DIAGNOSIS — Z95810 Presence of automatic (implantable) cardiac defibrillator: Secondary | ICD-10-CM

## 2018-04-02 NOTE — Progress Notes (Signed)
EPIC Encounter for ICM Monitoring  Patient Name: Bradley [name] is a 62 y.o. male Date: 04/02/2018 Primary Care Physican: [name], MD Primary Cardiologist:[facility] Heart Failure [facility] Electrophysiologist: Caryl Comes Nephrologist:Deterding Dry Weight:175 lbs      Call to wife.  Heart Failure questions reviewed, pt symptomatic with low BP and excessive sweating.  Reviewed symptoms of dehydration which correlates with impedance is above baseline.   Thoracic impedance above baseline suggesting dryness.  Prescribed dosage: Furosemide 20 mg 1 tablet every Monday and Thursday.   Labs: 02/20/2018 Creatinine 1.11, BUN 16, Potassium 4.8, Sodium 141, EGFR >60 09/19/2017 Creatinine 1.09, BUN 16, Potassium 4.9, Sodium 141, EGFR >60 08/27/2017 Creatinine 1.25, BUN 25, Potassium 4.9, Sodium 141, EGFR 62-72 07/16/2017 Creatinine 1.16, BUN 21, Potassium 4.7, Sodium 141, EGFR 68.16 06/18/2017 Creatinine 1.15, BUN 25, Potassium 5.4, Sodium 143 03/21/2017 Creatinine 1.27, BUN 18, Potassium 3.9, Sodium 139, EGFR 61.46 01/01/2017 Creatinine 1.39, BUN 22, Potassium 3.6, Sodium 143, EGFR 54->60 10/22/2016 Creatinine 1.09, BUN 20, Potassium 3.8, Sodium 145, EGFR >60  10/04/2016 Creatinine 1.23, BUN 20, Potassium 4.0, Sodium 140 08/09/2016 Creatinine 1.19, BUN 19, Potassium 3.9, Sodium 142, EGFR > 60  Recommendations:  Advised to increase fluid intake for the next couple of days and if symptoms do not improve to schedule physician appointment or use ER if needed.  Follow-up plan: ICM clinic phone appointment on 05/02/2018.    Copy of ICM check sent to Dr. Caryl Comes.   3 month ICM trend: 04/02/2018    1 Year ICM trend:       [name], RN 04/02/2018 11:19 AM

## 2018-04-11 LAB — CUP PACEART REMOTE DEVICE CHECK
Battery Remaining Longevity: 66 mo
Battery Remaining Percentage: 64 %
Battery Voltage: 2.95 V
Brady Statistic RV Percent Paced: 1 %
Date Time Interrogation Session: 20190801102957
HighPow Impedance: 83 Ohm
HighPow Impedance: 83 Ohm
Implantable Lead Implant Date: 20160603
Implantable Lead Location: 753860
Implantable Lead Model: 7122
Implantable Pulse Generator Implant Date: 20160603
Lead Channel Impedance Value: 400 Ohm
Lead Channel Pacing Threshold Amplitude: 1.25 V
Lead Channel Pacing Threshold Pulse Width: 0.5 ms
Lead Channel Sensing Intrinsic Amplitude: 11.7 mV
Lead Channel Setting Pacing Amplitude: 2.5 V
Lead Channel Setting Pacing Pulse Width: 0.5 ms
Lead Channel Setting Sensing Sensitivity: 0.5 mV
Pulse Gen Serial Number: 7135169

## 2018-04-24 ENCOUNTER — Other Ambulatory Visit: Payer: Self-pay | Admitting: Internal Medicine

## 2018-04-24 NOTE — Telephone Encounter (Signed)
Pt's pharmacy is requesting a refill on lovothyroxine. Would Dr. Harrington Challenger like to refill this medication? Please address

## 2018-04-25 ENCOUNTER — Other Ambulatory Visit (HOSPITAL_COMMUNITY): Payer: Self-pay | Admitting: Internal Medicine

## 2018-05-02 ENCOUNTER — Ambulatory Visit (INDEPENDENT_AMBULATORY_CARE_PROVIDER_SITE_OTHER): Payer: Medicare HMO

## 2018-05-02 DIAGNOSIS — Z95810 Presence of automatic (implantable) cardiac defibrillator: Secondary | ICD-10-CM | POA: Diagnosis not present

## 2018-05-02 DIAGNOSIS — I5042 Chronic combined systolic (congestive) and diastolic (congestive) heart failure: Secondary | ICD-10-CM | POA: Diagnosis not present

## 2018-05-02 NOTE — Progress Notes (Signed)
EPIC Encounter for ICM Monitoring  Patient Name: Bradley [name] is a 62 y.o. male Date: 05/02/2018 Primary Care Physican: [name], MD Primary Cardiologist:[facility] Clinic:[facility] Electrophysiologist: Caryl Comes Nephrologist:Deterding Dry Weight:Previous weight 175lbs          Attempted call to wife and unable to reach.  Left detailed message, per DPR, regarding transmission.  Transmission reviewed.    Thoracic impedance normal.  Prescribed: Furosemide 20 mg 1 tablet every Monday and Thursday.   Labs: 02/20/2018 Creatinine 1.11, BUN 16, Potassium 4.8, Sodium 141, EGFR >60 09/19/2017 Creatinine 1.09, BUN 16, Potassium 4.9, Sodium 141, EGFR >60 08/27/2017 Creatinine 1.25, BUN 25, Potassium 4.9, Sodium 141, EGFR 62-72  Recommendations: Left voice mail with ICM number and encouraged to call if experiencing any fluid symptoms.  Follow-up plan: ICM clinic phone appointment on 06/06/2018.       Copy of ICM check sent to Dr. Caryl Comes.   3 month ICM trend: 05/02/2018    1 Year ICM trend:       [name], RN 05/02/2018 12:21 PM

## 2018-05-03 ENCOUNTER — Telehealth: Payer: Self-pay

## 2018-05-03 NOTE — Telephone Encounter (Signed)
Remote ICM transmission received.  Attempted call to wife/patient and left detailed message, per DPR, regarding transmission and next ICM scheduled for 06/06/2018.  Advised to return call for any fluid symptoms or questions.

## 2018-05-12 ENCOUNTER — Other Ambulatory Visit: Payer: Self-pay | Admitting: Family Medicine

## 2018-05-18 ENCOUNTER — Other Ambulatory Visit: Payer: Self-pay | Admitting: Family Medicine

## 2018-05-25 ENCOUNTER — Other Ambulatory Visit: Payer: Self-pay | Admitting: Family Medicine

## 2018-05-27 DIAGNOSIS — I5042 Chronic combined systolic (congestive) and diastolic (congestive) heart failure: Secondary | ICD-10-CM | POA: Diagnosis not present

## 2018-05-29 ENCOUNTER — Ambulatory Visit (INDEPENDENT_AMBULATORY_CARE_PROVIDER_SITE_OTHER): Payer: Medicare HMO | Admitting: Family Medicine

## 2018-05-29 ENCOUNTER — Encounter: Payer: Self-pay | Admitting: Family Medicine

## 2018-05-29 ENCOUNTER — Other Ambulatory Visit: Payer: Self-pay

## 2018-05-29 VITALS — BP 112/78 | HR 55 | Temp 98.0°F | Ht 72.0 in | Wt 179.4 lb

## 2018-05-29 DIAGNOSIS — Z23 Encounter for immunization: Secondary | ICD-10-CM | POA: Diagnosis not present

## 2018-05-29 DIAGNOSIS — Z0000 Encounter for general adult medical examination without abnormal findings: Secondary | ICD-10-CM | POA: Diagnosis not present

## 2018-05-29 LAB — CBC WITH DIFFERENTIAL/PLATELET
Basophils Absolute: 0.1 10*3/uL (ref 0.0–0.1)
Basophils Relative: 1 % (ref 0.0–3.0)
Eosinophils Absolute: 0.2 10*3/uL (ref 0.0–0.7)
Eosinophils Relative: 3.1 % (ref 0.0–5.0)
HCT: 45.2 % (ref 39.0–52.0)
Hemoglobin: 15.2 g/dL (ref 13.0–17.0)
Lymphocytes Relative: 18.5 % (ref 12.0–46.0)
Lymphs Abs: 1.2 10*3/uL (ref 0.7–4.0)
MCHC: 33.6 g/dL (ref 30.0–36.0)
MCV: 93.9 fl (ref 78.0–100.0)
Monocytes Absolute: 0.7 10*3/uL (ref 0.1–1.0)
Monocytes Relative: 11.5 % (ref 3.0–12.0)
Neutro Abs: 4.2 10*3/uL (ref 1.4–7.7)
Neutrophils Relative %: 65.9 % (ref 43.0–77.0)
Platelets: 134 10*3/uL — ABNORMAL LOW (ref 150.0–400.0)
RBC: 4.81 Mil/uL (ref 4.22–5.81)
RDW: 14.4 % (ref 11.5–15.5)
WBC: 6.4 10*3/uL (ref 4.0–10.5)

## 2018-05-29 LAB — BASIC METABOLIC PANEL
BUN: 20 mg/dL (ref 6–23)
CO2: 30 mEq/L (ref 19–32)
Calcium: 9.5 mg/dL (ref 8.4–10.5)
Chloride: 106 mEq/L (ref 96–112)
Creatinine, Ser: 1.08 mg/dL (ref 0.40–1.50)
GFR: 73.8 mL/min (ref >60.00)
Glucose, Bld: 111 mg/dL — ABNORMAL HIGH (ref 70–99)
Potassium: 4.5 mEq/L (ref 3.5–5.1)
Sodium: 144 mEq/L (ref 135–145)

## 2018-05-29 LAB — LIPID PANEL
Cholesterol: 141 mg/dL (ref 0–200)
HDL: 46.7 mg/dL (ref >39.00)
LDL Cholesterol: 81 mg/dL (ref 0–99)
NonHDL: 94.31
Total CHOL/HDL Ratio: 3
Triglycerides: 69 mg/dL (ref 0.0–149.0)
VLDL: 13.8 mg/dL (ref 0.0–40.0)

## 2018-05-29 LAB — HEPATIC FUNCTION PANEL
ALT: 24 U/L (ref 0–53)
AST: 22 U/L (ref 0–37)
Albumin: 4.2 g/dL (ref 3.5–5.2)
Alkaline Phosphatase: 45 U/L (ref 39–117)
Bilirubin, Direct: 0.1 mg/dL (ref 0.0–0.3)
Total Bilirubin: 0.6 mg/dL (ref 0.2–1.2)
Total Protein: 6.6 g/dL (ref 6.0–8.3)

## 2018-05-29 LAB — TSH: TSH: 2.57 u[IU]/mL (ref 0.35–4.50)

## 2018-05-29 NOTE — Progress Notes (Signed)
Subjective:     Patient ID: Bradley [name], male   DOB: Feb 09, 1956, 62 y.o.   MRN: [id number]  HPI Patient is here for physical exam.  He has history of combined systolic and diastolic heart failure, nonischemic cardiomyopathy, hypertension, hypothyroidism, history of aortic valve replacement 2015 secondary to endocarditis, history of recurrent depression, remote history of aortic valve repair 1968.  He is followed closely through cardiology heart failure clinic.  Patient has been doing relatively well.  Appetite and weight stable.  Needs flu vaccine.  Has had previous Pneumovax.  Echocardiogram last July ejection fraction 30 to 35%.  Cologuard 2017 normal.  No history of shingles vaccine.  Denies any recent chest pains, dizziness, dyspnea, peripheral edema.  Past Medical History:  Diagnosis Date  . Anxiety   . Aortic stenosis    s/p Bentall with bioprosthetic AVR 02/2010; Last echo (9/11): Moderate LVH, EF 45-50%, AVR functioning appropriately, aortic valve mean gradient 21, diastolic dysfunction. Chest MRA (2/13): Mild to moderate dilatation at the sinus of Valsalva at 4.1 cm, mild dilatation ascending aorta distal to the tube graft at 3.9 cm, moderate dilatation of the innominate artery a 2.1 cm;    . Depression   . ESRD on dialysis [facility])    a. 09/2013 felt to be related to gentamycin b. no longer requiring HD  . Hx of cardiac cath    a. LHC in 02/2010: normal cors  . Hx of echocardiogram 02/2014   Echo (8/15):  Mod LV, EF 35-40%, Gr 1 DD, AVR ok (mean 14 mmHg), mild LAE  . Hypertension   . Hypothyroidism, postsurgical   . Prosthetic valve endocarditis ([place])   . Staphylococcus aureus bacteremia   . Thyroid cancer ([place])   . Ventricular tachycardia ([place])    a. s/p STJ ICD   Past Surgical History:  Procedure Laterality Date  . AORTIC VALVE REPAIR  1968  . AORTIC VALVE REPLACEMENT  2011  . AV FISTULA PLACEMENT Left 10/08/2013   Procedure: ARTERIOVENOUS (AV) FISTULA CREATION- LEFT ARM;  Radial Cephalic ;  Surgeon: Mal [name], MD;  Location: [place];  Service: Vascular;  Laterality: Left;  . BENTALL PROCEDURE N/A 10/28/2013   Procedure: REDO BENTALL PROCEDURE, debridment of aoritc root abscess, replacement of aortic root, ascending aorta and aortic valve with homograft. Insertion of left femoral arterial line;  Surgeon: [name], MD;  Location: [place];  Service: Open Heart Surgery;  Laterality: N/A;  . CARDIAC CATHETERIZATION  03/02/2010   NORMAL CORONARY ARTERY  . CARDIAC CATHETERIZATION N/A 01/01/2015   Procedure: Coronary/Graft Angiography;  Surgeon: Troy Sine, MD;  Location: [place] CV LAB;  Service: Cardiovascular;  Laterality: N/A;  . CARDIAC VALVE REPLACEMENT    . EP IMPLANTABLE DEVICE N/A 01/01/2015   Procedure: ICD Implant;  Surgeon: Deboraha Sprang, MD;  Location: [place] CV LAB;  Service: Cardiovascular;  Laterality: N/A;  . INTRAOPERATIVE TRANSESOPHAGEAL ECHOCARDIOGRAM N/A 10/28/2013   Procedure: INTRAOPERATIVE TRANSESOPHAGEAL ECHOCARDIOGRAM;  Surgeon: [name], MD;  Location: [place];  Service: Open Heart Surgery;  Laterality: N/A;  . PERIPHERAL VASCULAR CATHETERIZATION N/A 01/01/2015   Procedure: Aortic Arch Angiography;  Surgeon: Troy Sine, MD;  Location: [place] CV LAB;  Service: Cardiovascular;  Laterality: N/A;  . SHOULDER ARTHROSCOPY W/ ROTATOR CUFF REPAIR Right 2012  . STERNOTOMY     REDO  . TEE WITHOUT CARDIOVERSION N/A 10/24/2013   Procedure: TRANSESOPHAGEAL ECHOCARDIOGRAM (TEE);  Surgeon: Dorothy Spark, MD;  Location: [place];  Service: Cardiovascular;  Laterality: N/A;  . THYROIDECTOMY  ~ 2005  . TRANSTHORACIC ECHOCARDIOGRAM  03/2010   SHOWED MILD REDUCTION OF LV FUNCTION    reports that he has never smoked. He has never used smokeless tobacco. He reports that he does not drink alcohol or use drugs. family history includes Heart disease in his father; Hypertension in his father and mother; Kidney Stones in his sister;  Stroke in his father; Thyroid cancer in his sister and sister. Allergies  Allergen Reactions  . Oxycodone Other (See Comments)    Gives patient nightmares  . Rifampin Nausea Only     Review of Systems  Constitutional: Negative for activity change, appetite change, fatigue, fever and unexpected weight change.  HENT: Negative for congestion, ear pain and trouble swallowing.   Eyes: Negative for pain and visual disturbance.  Respiratory: Negative for cough, chest tightness, shortness of breath and wheezing.   Cardiovascular: Negative for chest pain, palpitations and leg swelling.  Gastrointestinal: Negative for abdominal distention, abdominal pain, blood in stool, constipation, diarrhea, nausea, rectal pain and vomiting.  Genitourinary: Negative for dysuria, hematuria and testicular pain.  Musculoskeletal: Negative for arthralgias and joint swelling.  Skin: Negative for rash.  Neurological: Negative for dizziness, syncope, weakness, light-headedness and headaches.  Hematological: Negative for adenopathy.  Psychiatric/Behavioral: Negative for confusion and dysphoric mood.       Objective:   Physical Exam  Constitutional: He is oriented to person, place, and time. He appears well-developed and well-nourished. No distress.  HENT:  Head: Normocephalic and atraumatic.  Right Ear: External ear normal.  Left Ear: External ear normal.  Mouth/Throat: Oropharynx is clear and moist.  Eyes: Pupils are equal, round, and reactive to light. Conjunctivae and EOM are normal.  Neck: Normal range of motion. Neck supple. No thyromegaly present.  Cardiovascular: Normal rate, regular rhythm and normal heart sounds.  No murmur heard. Pulmonary/Chest: No respiratory distress. He has no wheezes. He has no rales.  Abdominal: Soft. Bowel sounds are normal. He exhibits no distension and no mass. There is no tenderness. There is no rebound and no guarding.  Musculoskeletal: He exhibits no edema.   Lymphadenopathy:    He has no cervical adenopathy.  Neurological: He is alert and oriented to person, place, and time. He displays normal reflexes. No cranial nerve deficit.  Skin: No rash noted.  Psychiatric: He has a normal mood and affect.       Assessment:     Physical exam.  Patient has multiple chronic medical problems as above which are relatively stable.  We discussed the following health maintenance issues    Plan:     -Flu vaccine recommended and given -Patient has already had prior Pneumovax.  Will get Prevnar 13 followed by Pneumovax at 56 -Consider repeat Cologuard in 1 year -The natural history of prostate cancer and ongoing controversy regarding screening and potential treatment outcomes of prostate cancer has been discussed with the patient. The meaning of a false positive PSA and a false negative PSA has been discussed. He indicates understanding of the limitations of this screening test and wishes not to proceed with screening PSA testing.  [name] MD [facility]

## 2018-05-29 NOTE — Patient Instructions (Signed)
Consider shingles vaccine (Shingrix) and check on insurance coverage if interested.

## 2018-06-02 ENCOUNTER — Other Ambulatory Visit: Payer: Self-pay | Admitting: Internal Medicine

## 2018-06-06 ENCOUNTER — Ambulatory Visit (INDEPENDENT_AMBULATORY_CARE_PROVIDER_SITE_OTHER): Payer: Medicare HMO

## 2018-06-06 ENCOUNTER — Ambulatory Visit (INDEPENDENT_AMBULATORY_CARE_PROVIDER_SITE_OTHER): Payer: Medicare HMO | Admitting: *Deleted

## 2018-06-06 ENCOUNTER — Telehealth: Payer: Self-pay

## 2018-06-06 DIAGNOSIS — I5042 Chronic combined systolic (congestive) and diastolic (congestive) heart failure: Secondary | ICD-10-CM | POA: Diagnosis not present

## 2018-06-06 DIAGNOSIS — I472 Ventricular tachycardia, unspecified: Secondary | ICD-10-CM

## 2018-06-06 DIAGNOSIS — Z95810 Presence of automatic (implantable) cardiac defibrillator: Secondary | ICD-10-CM | POA: Diagnosis not present

## 2018-06-06 NOTE — Progress Notes (Signed)
Remote ICD transmission.   

## 2018-06-06 NOTE — Progress Notes (Signed)
EPIC Encounter for ICM Monitoring  Patient Name: Bradley [name] is a 62 y.o. male Date: 06/06/2018 Primary Care Physican: [name], MD Primary Cardiologist:[facility] Heart Failure Clinic:[facility] Electrophysiologist: Caryl Comes Nephrologist:Deterding Dry Weight:Previous weight 175lbs      Attempted call to wife and unable to reach.  Left detailed message, per DPR, regarding transmission.  Transmission reviewed.    Thoracic impedance abnormal suggesting dryness starting 05/30/2018.   Prescribed: Furosemide 20 mg 1 tablet every Monday and Thursday.   Labs: 02/20/2018 Creatinine 1.11, BUN 16, Potassium 4.8, Sodium 141, EGFR >60 09/19/2017 Creatinine 1.09, BUN 16, Potassium 4.9, Sodium 141, EGFR >60 08/27/2017 Creatinine 1.25, BUN 25, Potassium 4.9, Sodium 141, EGFR 62-72  Recommendations: Unable to reach.  Follow-up plan: ICM clinic phone appointment on 07/22/2018.   Office appointment scheduled 06/20/2018 with Dr. Caryl Comes.    Copy of ICM check sent to Dr. Caryl Comes.   3 month ICM trend: 06/06/2018    1 Year ICM trend:       [name], RN 06/06/2018 10:39 AM

## 2018-06-06 NOTE — Telephone Encounter (Signed)
Remote ICM transmission received.  Attempted call to wife regarding ICM remote transmission and person answering phone stated she was not home.

## 2018-06-09 ENCOUNTER — Encounter: Payer: Self-pay | Admitting: Cardiology

## 2018-06-15 ENCOUNTER — Other Ambulatory Visit: Payer: Self-pay | Admitting: Internal Medicine

## 2018-06-15 DIAGNOSIS — I10 Essential (primary) hypertension: Secondary | ICD-10-CM

## 2018-06-18 ENCOUNTER — Encounter: Payer: Medicare HMO | Admitting: Internal Medicine

## 2018-06-20 ENCOUNTER — Encounter: Payer: Self-pay | Admitting: Internal Medicine

## 2018-06-20 ENCOUNTER — Ambulatory Visit (INDEPENDENT_AMBULATORY_CARE_PROVIDER_SITE_OTHER): Payer: Medicare HMO | Admitting: Internal Medicine

## 2018-06-20 VITALS — BP 108/62 | HR 57 | Ht 73.0 in | Wt 183.8 lb

## 2018-06-20 DIAGNOSIS — I5042 Chronic combined systolic (congestive) and diastolic (congestive) heart failure: Secondary | ICD-10-CM

## 2018-06-20 DIAGNOSIS — I472 Ventricular tachycardia, unspecified: Secondary | ICD-10-CM

## 2018-06-20 DIAGNOSIS — Z95810 Presence of automatic (implantable) cardiac defibrillator: Secondary | ICD-10-CM

## 2018-06-20 DIAGNOSIS — I428 Other cardiomyopathies: Secondary | ICD-10-CM | POA: Diagnosis not present

## 2018-06-20 NOTE — Patient Instructions (Signed)
Medication Instructions:  Your physician recommends that you continue on your current medications as directed. Please refer to the Current Medication list given to you today.  Labwork: None ordered.  Testing/Procedures: None ordered.  Follow-Up: Your physician recommends that you schedule a follow-up appointment in:   One Year with Chanetta Marshall, NP  Remote monitoring is used to monitor your Pacemaker of ICD from home. This monitoring reduces the number of office visits required to check your device to one time per year. It allows [place] to keep an eye on the functioning of your device to ensure it is working properly. You are scheduled for a device check from home on 2/6. You may send your transmission at any time that day. If you have a wireless device, the transmission will be sent automatically. After your physician reviews your transmission, you will receive a postcard with your next transmission date.    Any Other Special Instructions Will Be Listed Below (If Applicable).     If you need a refill on your cardiac medications before your next appointment, please call your pharmacy.

## 2018-06-20 NOTE — Progress Notes (Signed)
Patient Care Team: [name], MD as PCP - General Fay Records, MD as PCP - Cardiology (Cardiology) [name], MD as Consulting Physician (Cardiothoracic Surgery) Deterding, Jeneen Rinks, MD as Consulting Physician (Nephrology) Deboraha Sprang, MD as Consulting Physician (Cardiology)   HPI  Bradley [name] is a 62 y.o. male Seen in follow-up for implantable defibrillator St Jude  for sustained ventricular tachycardia 5/16 occurring with a frequency that required adjunctive medical therapy. He ended up on amiodarone but then switched to mex and ranolazine.   A balloon valvotomy at age 39 for presumed congenital aortic stenosis   Bentall procedure in 2001 with a pericardial tissue valve. He had MSSA bacteremia and bacterial endocarditis in 03/5637 complicated by renal failure requiring dialysis for a short period of time. 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 Anxiety   . Aortic stenosis    s/p Bentall with bioprosthetic AVR 02/2010; Last echo (9/11): Moderate LVH, EF 45-50%, AVR functioning appropriately, aortic valve mean gradient 21, diastolic dysfunction. Chest MRA (2/13): Mild to moderate dilatation at the sinus of Valsalva at 4.1 cm, mild dilatation ascending aorta distal to the tube graft at 3.9 cm, moderate dilatation of the innominate artery a 2.1 cm;    . Depression   . ESRD on dialysis [facility])    a. 09/2013 felt to be related to gentamycin b. no longer requiring HD  . Hx of  cardiac cath    a. LHC in 02/2010: normal cors  . Hx of echocardiogram 02/2014   Echo (8/15):  Mod LV, EF 35-40%, Gr 1 DD, AVR ok (mean 14 mmHg), mild LAE  . Hypertension   . Hypothyroidism, postsurgical   . Prosthetic valve endocarditis ([place])   . Staphylococcus aureus bacteremia   . Thyroid cancer ([place])   . Ventricular tachycardia ([place])    a. s/p STJ ICD    Past Surgical History:  Procedure Laterality Date  . AORTIC VALVE REPAIR  1968  . AORTIC VALVE REPLACEMENT  2011  . AV FISTULA PLACEMENT Left 10/08/2013   Procedure: ARTERIOVENOUS (AV) FISTULA CREATION- LEFT ARM; Radial Cephalic ;  Surgeon: Mal [name], MD;  Location: [place];  Service: Vascular;  Laterality: Left;  . BENTALL PROCEDURE N/A 10/28/2013   Procedure: REDO BENTALL PROCEDURE, debridment of aoritc root abscess, replacement of aortic root, ascending aorta and aortic valve with homograft. Insertion of left femoral arterial line;  Surgeon: [name], MD;  Location: [place];  Service: Open Heart Surgery;  Laterality: N/A;  . CARDIAC CATHETERIZATION  03/02/2010   NORMAL CORONARY ARTERY  . CARDIAC CATHETERIZATION N/A 01/01/2015   Procedure: Coronary/Graft Angiography;  Surgeon: Troy Sine, MD;  Location: [place] CV LAB;  Service: Cardiovascular;  Laterality: N/A;  . CARDIAC VALVE REPLACEMENT    . EP IMPLANTABLE DEVICE N/A 01/01/2015   Procedure: ICD  Implant;  Surgeon: Deboraha Sprang, MD;  Location: [place] CV LAB;  Service: Cardiovascular;  Laterality: N/A;  . INTRAOPERATIVE TRANSESOPHAGEAL ECHOCARDIOGRAM N/A 10/28/2013   Procedure: INTRAOPERATIVE TRANSESOPHAGEAL ECHOCARDIOGRAM;  Surgeon: [name], MD;  Location: [place];  Service: Open Heart Surgery;  Laterality: N/A;  . PERIPHERAL VASCULAR CATHETERIZATION N/A 01/01/2015   Procedure: Aortic Arch Angiography;  Surgeon: Troy Sine, MD;  Location: [place] CV LAB;  Service: Cardiovascular;  Laterality: N/A;  . SHOULDER ARTHROSCOPY W/ ROTATOR CUFF REPAIR Right  2012  . STERNOTOMY     REDO  . TEE WITHOUT CARDIOVERSION N/A 10/24/2013   Procedure: TRANSESOPHAGEAL ECHOCARDIOGRAM (TEE);  Surgeon: Dorothy Spark, MD;  [facility];  Service: Cardiovascular;  Laterality: N/A;  . THYROIDECTOMY  ~ 2005  . TRANSTHORACIC ECHOCARDIOGRAM  03/2010   SHOWED MILD REDUCTION OF LV FUNCTION    Current Outpatient Medications  Medication Sig Dispense Refill  . aspirin EC 81 MG tablet Take 81 mg by mouth daily.     Marland Kitchen atorvastatin (LIPITOR) 40 MG tablet TAKE 1 TABLET BY MOUTH DAILY 30 tablet 0  . betamethasone valerate ointment (VALISONE) 0.1 % APPLY  OINTMENT TO AFFECTED AREA TWICE DAILY 30 g 2  . carvedilol (COREG) 6.25 MG tablet Take 1 tablet (6.25 mg total) by mouth 2 (two) times daily with a meal. 180 tablet 3  . clonazePAM (KLONOPIN) 1 MG tablet TAKE ONE TABLET BY MOUTH TWICE DAILY AS NEEDED NOT  FOR  REGULAR  USE 30 tablet 0  . docusate sodium (STOOL SOFTENER) 100 MG capsule Take 100 mg by mouth 2 (two) times daily.    Marland Kitchen ENSURE (ENSURE) Take 237 mLs by mouth daily as needed. 237 mL 12  . ENTRESTO 97-103 MG TAKE 1 TABLET BY MOUTH 2 (TWO) TIMES DAILY. 60 tablet 5  . furosemide (LASIX) 20 MG tablet TAKE 1 TABLET BY MOUTH EVERY MONDAY AND THURSDAY 20 tablet 0  . levothyroxine (SYNTHROID, LEVOTHROID) 175 MCG tablet TAKE 1 TABLET (175 MCG TOTAL) BY MOUTH DAILY BEFORE BREAKFAST. 90 tablet 3  . mexiletine (MEXITIL) 150 MG capsule TAKE 2 CAPSULES BY MOUTH EVERY MORNING AND 2 CAPSULES BY MOUTH EVERY EVENING. Please make yearly appt with Dr. Harrington Challenger for January. 1st attempt 360 capsule 0  . ranolazine (RANEXA) 500 MG 12 hr tablet TAKE 1 TABLET BY MOUTH TWICE A DAY 180 tablet 1  . sertraline (ZOLOFT) 50 MG tablet Take 1 tablet (50 mg total) by mouth daily. 90 tablet 3   No current facility-administered medications for this visit.     Allergies  Allergen Reactions  . Oxycodone Other (See Comments)    Gives patient nightmares  . Rifampin Nausea Only       Review of Systems negative except from HPI and PMH  Physical Exam BP 108/62   Pulse (!) 57   Ht 6\' 1"  (1.854 m)   Wt 183 lb 12.8 oz (83.4 kg)   SpO2 98%   BMI 24.25 kg/m  Well developed and nourished in no acute distress HENT normal Neck supple with JVP-flat Clear Regular rate and rhythm, no murmurs or gallops Abd-soft with active BS No Clubbing cyanosis tr edema Skin-warm and dry A & Oriented  Grossly normal sensory and motor function   ECG demonstrates sinus 57 20/13/39 IPMI  Assessment and  Plan  Ventricular tachycarda  ICD St Jude  The patient's device was interrogated.  The information was reviewed. No changes were made in the programming.  NICM  CHF chronic systolic  Mild volume overload with elevated CorVue    Will have him increase furosemide x 3 days  Labs WNL   Continue meds

## 2018-06-26 LAB — CUP PACEART INCLINIC DEVICE CHECK
Date Time Interrogation Session: 20191127151013
Implantable Lead Implant Date: 20160603
Implantable Lead Location: 753860
Implantable Lead Model: 7122
Implantable Pulse Generator Implant Date: 20160603
Pulse Gen Serial Number: 7135169

## 2018-06-30 ENCOUNTER — Other Ambulatory Visit: Payer: Self-pay | Admitting: Family Medicine

## 2018-07-01 NOTE — Telephone Encounter (Signed)
Refill Sertraline for one year.  Refill Clonazepam once.

## 2018-07-01 NOTE — Telephone Encounter (Signed)
Last OV 05/29/2018, Next OV 11/29/18  Sertraline last filled 03/21/17, # 90 with 3 refills  Clonazepam last filled 01/29/17, # 30 with 0 refills

## 2018-07-07 ENCOUNTER — Other Ambulatory Visit: Payer: Self-pay | Admitting: Family Medicine

## 2018-07-08 NOTE — Telephone Encounter (Signed)
Wasn't sure if my note not seen   Refill Sertraline for one year.

## 2018-07-09 ENCOUNTER — Other Ambulatory Visit: Payer: Self-pay

## 2018-07-09 MED ORDER — CLONAZEPAM 1 MG PO TABS: ORAL_TABLET | ORAL | 0 refills | Status: DC

## 2018-07-09 MED ORDER — SERTRALINE HCL 50 MG PO TABS: 50.0000 mg | ORAL_TABLET | Freq: Every day | ORAL | 3 refills | Status: DC

## 2018-07-09 NOTE — Telephone Encounter (Signed)
Pt calling to check status of refills. Need to go to [facility] please.

## 2018-07-10 ENCOUNTER — Other Ambulatory Visit (HOSPITAL_COMMUNITY): Payer: Self-pay | Admitting: Pharmacist

## 2018-07-10 MED ORDER — DIGOXIN 125 MCG PO TABS: 125.0000 ug | ORAL_TABLET | Freq: Every day | ORAL | 3 refills | Status: DC

## 2018-07-10 NOTE — Telephone Encounter (Signed)
Called patient and spoke to Bradley [name] his spouse and she stated that she will check with [facility] today to see if they received the refills.

## 2018-07-17 ENCOUNTER — Other Ambulatory Visit: Payer: Self-pay

## 2018-07-17 MED ORDER — ATORVASTATIN CALCIUM 40 MG PO TABS: 40.0000 mg | ORAL_TABLET | Freq: Every day | ORAL | 3 refills | Status: DC

## 2018-07-17 MED ORDER — SERTRALINE HCL 50 MG PO TABS: 50.0000 mg | ORAL_TABLET | Freq: Every day | ORAL | 3 refills | Status: DC

## 2018-07-22 ENCOUNTER — Ambulatory Visit (INDEPENDENT_AMBULATORY_CARE_PROVIDER_SITE_OTHER): Payer: Medicare HMO

## 2018-07-22 DIAGNOSIS — I5042 Chronic combined systolic (congestive) and diastolic (congestive) heart failure: Secondary | ICD-10-CM | POA: Diagnosis not present

## 2018-07-22 DIAGNOSIS — Z95810 Presence of automatic (implantable) cardiac defibrillator: Secondary | ICD-10-CM | POA: Diagnosis not present

## 2018-07-23 NOTE — Progress Notes (Signed)
EPIC Encounter for ICM Monitoring  Patient Name: Bradley [name] is a 62 y.o. male Date: 07/23/2018 Primary Care Physican: [name], MD Heart Failure Clinic:[facility] Electrophysiologist: Caryl Comes Nephrologist:Deterding Last weight183lbs(06/20/2018 office visit)                                               Transmission reviewed. Furosemide was increased x 3 days at office visit with Dr Caryl Comes on 06/20/2018.   Thoracic impedance normal but was abnormal suggesting fluid accumulation from 07/14/2018 - 07/20/2018.   Prescribed: Furosemide 20 mg 1 tablet every Monday and Thursday.   Labs: 02/20/2018 Creatinine 1.11, BUN 16, Potassium 4.8, Sodium 141, EGFR >60 09/19/2017 Creatinine 1.09, BUN 16, Potassium 4.9, Sodium 141, EGFR >60 08/27/2017 Creatinine 1.25, BUN 25, Potassium 4.9, Sodium 141, EGFR 62-72  Recommendations: None  Follow-up plan: ICM clinic phone appointment on 01/23/220.  Copy of ICM check sent to Dr. Caryl Comes  3 month ICM trend: 07/22/2018    1 Year ICM trend:      [name], RN 07/23/2018 7:49 AM

## 2018-07-27 DIAGNOSIS — I5042 Chronic combined systolic (congestive) and diastolic (congestive) heart failure: Secondary | ICD-10-CM | POA: Diagnosis not present

## 2018-08-08 LAB — CUP PACEART REMOTE DEVICE CHECK
Battery Remaining Longevity: 65 mo
Battery Remaining Percentage: 63 %
Battery Voltage: 2.95 V
Brady Statistic RV Percent Paced: 1 %
Date Time Interrogation Session: 20191107070500
HighPow Impedance: 66 Ohm
HighPow Impedance: 66 Ohm
Implantable Lead Implant Date: 20160603
Implantable Lead Location: 753860
Implantable Lead Model: 7122
Implantable Pulse Generator Implant Date: 20160603
Lead Channel Impedance Value: 350 Ohm
Lead Channel Pacing Threshold Amplitude: 1.25 V
Lead Channel Pacing Threshold Pulse Width: 0.5 ms
Lead Channel Sensing Intrinsic Amplitude: 11.7 mV
Lead Channel Setting Pacing Amplitude: 2.5 V
Lead Channel Setting Pacing Pulse Width: 0.5 ms
Lead Channel Setting Sensing Sensitivity: 0.5 mV
Pulse Gen Serial Number: 7135169

## 2018-08-10 ENCOUNTER — Other Ambulatory Visit: Payer: Self-pay | Admitting: Internal Medicine

## 2018-08-16 ENCOUNTER — Other Ambulatory Visit (HOSPITAL_COMMUNITY): Payer: Self-pay | Admitting: Internal Medicine

## 2018-08-22 ENCOUNTER — Ambulatory Visit (INDEPENDENT_AMBULATORY_CARE_PROVIDER_SITE_OTHER): Payer: Medicare HMO

## 2018-08-22 DIAGNOSIS — I5042 Chronic combined systolic (congestive) and diastolic (congestive) heart failure: Secondary | ICD-10-CM

## 2018-08-22 DIAGNOSIS — Z95810 Presence of automatic (implantable) cardiac defibrillator: Secondary | ICD-10-CM | POA: Diagnosis not present

## 2018-08-23 NOTE — Progress Notes (Signed)
EPIC Encounter for ICM Monitoring  Patient Name: Bradley [name] is a 63 y.o. male Date: 08/23/2018 Primary Care Physican: [name], MD Heart Failure Clinic:[facility] Electrophysiologist: Caryl Comes Nephrologist:Deterding Last weight183lbs(06/20/2018 office visit)  Transmission reviewed.  Thoracic impedance normal.   Prescribed:Furosemide 20 mg 1 tablet every Monday and Thursday.   Labs: 05/30/2019 Creatinine 1.08, BUN 20, Potassium 4.5, Sodium 144, eGFR 73.80 02/20/2018 Creatinine 1.11, BUN 16, Potassium 4.8, Sodium 141, EGFR >60 09/19/2017 Creatinine 1.09, BUN 16, Potassium 4.9, Sodium 141, EGFR >60 08/27/2017 Creatinine 1.25, BUN 25, Potassium 4.9, Sodium 141, EGFR 62-72  Recommendations:None  Follow-up plan: ICM clinic phone appointment on 09/24/2018.  Copy of ICM check sent to [place]    3 month ICM trend: 08/23/2018    1 Year ICM trend:       [name], RN 08/23/2018 12:15 PM

## 2018-08-31 ENCOUNTER — Other Ambulatory Visit: Payer: Self-pay | Admitting: Internal Medicine

## 2018-09-04 ENCOUNTER — Other Ambulatory Visit: Payer: Self-pay | Admitting: Internal Medicine

## 2018-09-05 ENCOUNTER — Ambulatory Visit (INDEPENDENT_AMBULATORY_CARE_PROVIDER_SITE_OTHER): Payer: Medicare HMO

## 2018-09-05 DIAGNOSIS — I428 Other cardiomyopathies: Secondary | ICD-10-CM

## 2018-09-05 DIAGNOSIS — I5042 Chronic combined systolic (congestive) and diastolic (congestive) heart failure: Secondary | ICD-10-CM

## 2018-09-06 LAB — CUP PACEART REMOTE DEVICE CHECK
Battery Remaining Longevity: 63 mo
Battery Remaining Percentage: 60 %
Battery Voltage: 2.95 V
Brady Statistic RV Percent Paced: 1 %
Date Time Interrogation Session: 20200206070015
HighPow Impedance: 62 Ohm
HighPow Impedance: 62 Ohm
Implantable Lead Implant Date: 20160603
Implantable Lead Location: 753860
Implantable Lead Model: 7122
Implantable Pulse Generator Implant Date: 20160603
Lead Channel Impedance Value: 350 Ohm
Lead Channel Pacing Threshold Amplitude: 1.25 V
Lead Channel Pacing Threshold Pulse Width: 0.5 ms
Lead Channel Sensing Intrinsic Amplitude: 10.6 mV
Lead Channel Setting Pacing Amplitude: 2.5 V
Lead Channel Setting Pacing Pulse Width: 0.5 ms
Lead Channel Setting Sensing Sensitivity: 0.5 mV
Pulse Gen Serial Number: 7135169

## 2018-09-14 ENCOUNTER — Other Ambulatory Visit: Payer: Self-pay | Admitting: Internal Medicine

## 2018-09-14 DIAGNOSIS — I10 Essential (primary) hypertension: Secondary | ICD-10-CM

## 2018-09-16 ENCOUNTER — Other Ambulatory Visit (HOSPITAL_COMMUNITY): Payer: Self-pay | Admitting: Cardiology

## 2018-09-16 NOTE — Progress Notes (Signed)
Remote ICD transmission.   

## 2018-09-23 ENCOUNTER — Other Ambulatory Visit (HOSPITAL_COMMUNITY): Payer: Self-pay | Admitting: *Deleted

## 2018-09-23 MED ORDER — CARVEDILOL 6.25 MG PO TABS: 6.2500 mg | ORAL_TABLET | Freq: Two times a day (BID) | ORAL | 3 refills | Status: DC

## 2018-09-24 ENCOUNTER — Ambulatory Visit (INDEPENDENT_AMBULATORY_CARE_PROVIDER_SITE_OTHER): Payer: Medicare HMO

## 2018-09-24 DIAGNOSIS — I5042 Chronic combined systolic (congestive) and diastolic (congestive) heart failure: Secondary | ICD-10-CM

## 2018-09-24 DIAGNOSIS — Z95810 Presence of automatic (implantable) cardiac defibrillator: Secondary | ICD-10-CM | POA: Diagnosis not present

## 2018-09-24 NOTE — Progress Notes (Signed)
EPIC Encounter for ICM Monitoring  Patient Name: Bradley [name] is a 63 y.o. male Date: 09/24/2018 Primary Care Physican: [name], MD Heart Failure Clinic:[facility] Electrophysiologist: Caryl Comes Nephrologist:Deterding Lastweight183lbs(06/20/2018 office visit)  Spoke with wife. She said patient is doing well and denied any fluid symptoms during decreased impedance. Decreased impedance occurred due to they ate in restaurants more because Patients sister was in town.  Thoracic impedance normal but was abnormal from 09/03/2018 - 09/13/2018.   Prescribed:Furosemide 20 mg 1 tablet every Monday and Thursday.   Labs: 05/30/2019 Creatinine 1.08, BUN 20, Potassium 4.5, Sodium 144, eGFR 73.80 02/20/2018 Creatinine 1.11, BUN 16, Potassium 4.8, Sodium 141, EGFR >60 09/19/2017 Creatinine 1.09, BUN 16, Potassium 4.9, Sodium 141, EGFR >60 08/27/2017 Creatinine 1.25, BUN 25, Potassium 4.9, Sodium 141, EGFR 62-72  Recommendations: No changes and encouraged to call for fluid symptoms.   Follow-up plan: ICM clinic phone appointment on 10/28/2018.  Advised to call to schedule 6 month office visit with Dr Haroldine Laws.   Copy of ICM check sent to [place]  3 month ICM trend: 09/24/2018    1 Year ICM trend:       [name], RN 09/24/2018 3:52 PM

## 2018-09-27 DIAGNOSIS — I5042 Chronic combined systolic (congestive) and diastolic (congestive) heart failure: Secondary | ICD-10-CM | POA: Diagnosis not present

## 2018-10-28 ENCOUNTER — Other Ambulatory Visit: Payer: Self-pay

## 2018-11-04 NOTE — Progress Notes (Signed)
No ICM remote transmission received for 10/28/2018 and next ICM transmission scheduled for 11/18/2018.

## 2018-11-07 ENCOUNTER — Other Ambulatory Visit: Payer: Self-pay | Admitting: Family Medicine

## 2018-11-07 NOTE — Telephone Encounter (Signed)
Last OV 05/29/18, Next OV 11/29/18  Last filled 07/09/18, # 30 with 0 refills

## 2018-11-07 NOTE — Telephone Encounter (Signed)
Refilled once.  He does not take regularly.

## 2018-11-11 ENCOUNTER — Other Ambulatory Visit: Payer: Self-pay | Admitting: Internal Medicine

## 2018-11-11 DIAGNOSIS — I10 Essential (primary) hypertension: Secondary | ICD-10-CM

## 2018-11-18 ENCOUNTER — Other Ambulatory Visit: Payer: Self-pay

## 2018-11-18 ENCOUNTER — Ambulatory Visit (INDEPENDENT_AMBULATORY_CARE_PROVIDER_SITE_OTHER): Payer: Medicare HMO

## 2018-11-18 DIAGNOSIS — I5042 Chronic combined systolic (congestive) and diastolic (congestive) heart failure: Secondary | ICD-10-CM | POA: Diagnosis not present

## 2018-11-18 DIAGNOSIS — Z95810 Presence of automatic (implantable) cardiac defibrillator: Secondary | ICD-10-CM | POA: Diagnosis not present

## 2018-11-19 NOTE — Progress Notes (Signed)
EPIC Encounter for ICM Monitoring  Patient Name: Bradley [name] is a 63 y.o. male Date: 11/19/2018 Primary Care Physican: [name], MD Heart Failure Clinic:[facility] Electrophysiologist: Caryl Comes Nephrologist:Deterding Lastweight183lbs(06/20/2018 office visit)  Transmission reviewed  Thoracic impedance normal.   Prescribed:Furosemide 20 mg 1 tablet every Monday and Thursday.   Labs: 05/30/2019 Creatinine 1.08, BUN 20, Potassium 4.5, Sodium 144, eGFR 73.80 02/20/2018 Creatinine 1.11, BUN 16, Potassium 4.8, Sodium 141, EGFR >60 09/19/2017 Creatinine 1.09, BUN 16, Potassium 4.9, Sodium 141, EGFR >60 08/27/2017 Creatinine 1.25, BUN 25, Potassium 4.9, Sodium 141, EGFR 62-72  Recommendations: None  Follow-up plan: ICM clinic phone appointment on 12/30/2018.   Visit with Dr Haroldine Laws 12/06/2018.  Copy of ICM check sent to [place]  3 month ICM trend: 11/19/2018    1 Year ICM trend:       [name], RN 11/19/2018 8:46 AM

## 2018-11-29 ENCOUNTER — Ambulatory Visit: Payer: Medicare HMO | Admitting: Family Medicine

## 2018-12-06 ENCOUNTER — Encounter (HOSPITAL_COMMUNITY): Payer: Self-pay | Admitting: *Deleted

## 2018-12-06 ENCOUNTER — Ambulatory Visit (HOSPITAL_COMMUNITY)
Admission: RE | Admit: 2018-12-06 | Discharge: 2018-12-06 | Disposition: A | Discharge status: Home / Self Care | Payer: Medicare HMO | Source: Ambulatory Visit | Attending: Internal Medicine | Admitting: Internal Medicine

## 2018-12-06 ENCOUNTER — Telehealth (HOSPITAL_COMMUNITY): Payer: Self-pay | Admitting: Vascular Surgery

## 2018-12-06 ENCOUNTER — Other Ambulatory Visit: Payer: Self-pay

## 2018-12-06 DIAGNOSIS — I5022 Chronic systolic (congestive) heart failure: Secondary | ICD-10-CM | POA: Diagnosis not present

## 2018-12-06 DIAGNOSIS — I472 Ventricular tachycardia, unspecified: Secondary | ICD-10-CM

## 2018-12-06 NOTE — Addendum Note (Signed)
Encounter addended by: Scarlette Calico, RN on: 12/06/2018 12:16 PM  Actions taken: Clinical Note Signed

## 2018-12-06 NOTE — Telephone Encounter (Signed)
Left pt message giving 4 month f/u 9/8 @ 9 am, asked pt to call to office to confirm

## 2018-12-06 NOTE — Patient Instructions (Addendum)
Your physician recommends that you schedule a follow-up appointment in: 4 months, we will contact you to schedule this appointment.  If you have any questions or concerns before your next appointment please send [place] a message through [place] or call our office at [phone].

## 2018-12-06 NOTE — Progress Notes (Addendum)
AVS sent to pt via mychart. 

## 2018-12-06 NOTE — Addendum Note (Signed)
Encounter addended by: Scarlette Calico, RN on: 12/06/2018 11:30 AM  Actions taken: Clinical Note Signed

## 2018-12-06 NOTE — Progress Notes (Signed)
Heart Failure TeleHealth Note  Due to national recommendations of social distancing due to [place] 19, Audio/video telehealth visit is felt to be most appropriate for this patient at this time.  See MyChart message from today for patient consent regarding telehealth for [facility].  Date:  12/06/2018   ID:  Bradley [name], DOB 1955/11/14, MRN [id number]  Location: Home  Provider location: [facility] Type of Visit: Established patient  PCP:  [name], MD  Cardiologist:  Dorris Carnes, MD Primary HF: Deneane Stifter  Chief Complaint: Heart Failure follow-up   History of Present Illness:  Bradley [name] is a 63 y/o male with a complicated past medical history including thyroid cancer (s/p thyroidectomy), hypertension, depression, and congenital aortic stenosis for which he underwent aortic vavulotomy at age 76 with subsequent Bentall procedure in 2001 with a pericardial tissue valve. He had MSSA bacteremia and bacterial endocarditis in 09/7251 complicated by renal failure requiring dialysis for a short period of time. 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   He was admitted 11/2014 with progressive episodes of palpitations and pre-syncope. He was found to have monomorphic ventricular tachycardia. Catheterization demonstrated normal coronaries. Echocardiogram demonstrated EF 30-35% and he underwent STJ dual chamber ICD implant.  He presents via [employer], civil (consulting) for a telehealth visit today.   Doing very well. 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 Has some allergies but no other problems. No CP, edema, orthopnea or PND.  BP runs in 120-125 at home. Taking lasix Mon/Thursday, hasn't taken any extra. No ICD firings.   [name] denies symptoms worrisome for COVID 19.   Studies:  Echo 7/19  30-35% AVR stable.  Personally reviewed Echo 5/18 EF 25-30%    CPX 01/01/17  FEV1 3.40 (86%)     FEV1/FVC 83 (107%)     MVV 163 (108%)  Resting HR: 72 Peak HR: 129  (80% age predicted max HR) BP rest: 118/70 BP peak: 174/84  Peak VO2: 21.1 (73% predicted peak VO2) VE/VCO2 slope: 35 OUES: 1.96 Peak RER: 1.22 Ventilatory Threshold: 16.6 (58% predicted or measured peak VO2) VE/MVV: 50% O2pulse: 15  (100% predicted O2pulse)    Past Medical History:  Diagnosis Date  . Anxiety   . Aortic stenosis    s/p Bentall with bioprosthetic AVR 02/2010; Last echo (9/11): Moderate LVH, EF 45-50%, AVR functioning appropriately, aortic valve mean gradient 21, diastolic dysfunction. Chest MRA (2/13): Mild to moderate dilatation at the sinus of Valsalva at 4.1 cm, mild dilatation ascending aorta distal to the tube graft at 3.9 cm, moderate dilatation of the innominate artery a 2.1 cm;    . Depression   . ESRD on dialysis [facility])    a. 09/2013 felt to be related to gentamycin b. no longer requiring HD  . Hx of cardiac cath    a. LHC in 02/2010: normal cors  . Hx of echocardiogram 02/2014   Echo (8/15):  Mod LV, EF 35-40%, Gr 1 DD, AVR ok (mean 14 mmHg), mild LAE  . Hypertension   . Hypothyroidism, postsurgical   . Prosthetic valve endocarditis ([place])   . Staphylococcus aureus bacteremia   . Thyroid cancer ([place])   . Ventricular tachycardia ([place])    a. s/p STJ ICD   Past Surgical History:  Procedure Laterality Date  . AORTIC VALVE REPAIR  1968  . AORTIC VALVE REPLACEMENT  2011  . AV FISTULA PLACEMENT Left 10/08/2013   Procedure: ARTERIOVENOUS (AV) FISTULA CREATION- LEFT ARM; Radial Cephalic ;  Surgeon: Mal [name], MD;  Location: [place];  Service: Vascular;  Laterality: Left;  . BENTALL PROCEDURE N/A 10/28/2013   Procedure: REDO BENTALL PROCEDURE, debridment of aoritc root abscess, replacement of aortic root, ascending aorta and aortic valve with homograft. Insertion of left femoral arterial line;   Surgeon: [name], MD;  Location: [facility];  Service: Open Heart Surgery;  Laterality: N/A;  . CARDIAC CATHETERIZATION  03/02/2010   NORMAL CORONARY ARTERY  . CARDIAC CATHETERIZATION N/A 01/01/2015   Procedure: Coronary/Graft Angiography;  Surgeon: Troy Sine, MD;  Location: [place] CV LAB;  Service: Cardiovascular;  Laterality: N/A;  . CARDIAC VALVE REPLACEMENT    . EP IMPLANTABLE DEVICE N/A 01/01/2015   Procedure: ICD Implant;  Surgeon: Deboraha Sprang, MD;  Location: [place] CV LAB;  Service: Cardiovascular;  Laterality: N/A;  . INTRAOPERATIVE TRANSESOPHAGEAL ECHOCARDIOGRAM N/A 10/28/2013   Procedure: INTRAOPERATIVE TRANSESOPHAGEAL ECHOCARDIOGRAM;  Surgeon: [name], MD;  Location: [place];  Service: Open Heart Surgery;  Laterality: N/A;  . PERIPHERAL VASCULAR CATHETERIZATION N/A 01/01/2015   Procedure: Aortic Arch Angiography;  Surgeon: Troy Sine, MD;  Location: [place] CV LAB;  Service: Cardiovascular;  Laterality: N/A;  . SHOULDER ARTHROSCOPY W/ ROTATOR CUFF REPAIR Right 2012  . STERNOTOMY     REDO  . TEE WITHOUT CARDIOVERSION N/A 10/24/2013   Procedure: TRANSESOPHAGEAL ECHOCARDIOGRAM (TEE);  Surgeon: Dorothy Spark, MD;  [facility] ENDOSCOPY;  Service: Cardiovascular;  Laterality: N/A;  . THYROIDECTOMY  ~ 2005  . TRANSTHORACIC ECHOCARDIOGRAM  03/2010   SHOWED MILD REDUCTION OF LV FUNCTION     Current Outpatient Medications  Medication Sig Dispense Refill  . aspirin EC 81 MG tablet Take 81 mg by mouth daily.     Marland Kitchen atorvastatin (LIPITOR) 40 MG tablet Take 1 tablet (40 mg total) by mouth daily. 30 tablet 3  . betamethasone valerate ointment (VALISONE) 0.1 % APPLY  OINTMENT TO AFFECTED AREA TWICE DAILY 30 g 2  . carvedilol (COREG) 6.25 MG tablet Take 1 tablet (6.25 mg total) by mouth 2 (two) times daily with a meal. 180 tablet 3  . clonazePAM (KLONOPIN) 1 MG tablet TAKE 1 TABLET BY MOUTH TWICE A DAY AS NEEDED. NOT FOR REGULAR USE 30 tablet 0  . digoxin  (LANOXIN) 0.125 MG tablet Take 1 tablet (125 mcg total) by mouth daily. 90 tablet 3  . docusate sodium (STOOL SOFTENER) 100 MG capsule Take 100 mg by mouth 2 (two) times daily.    Marland Kitchen ENSURE (ENSURE) Take 237 mLs by mouth daily as needed. 237 mL 12  . ENTRESTO 97-103 MG TAKE 1 TABLET BY MOUTH 2 (TWO) TIMES DAILY. 180 tablet 1  . furosemide (LASIX) 20 MG tablet TAKE 1 TABLET BY MOUTH EVERY MONDAY AND THURSDAY **NEED OFFICE VISIT** 8 tablet 2  . levothyroxine (SYNTHROID, LEVOTHROID) 175 MCG tablet TAKE 1 TABLET (175 MCG TOTAL) BY MOUTH DAILY BEFORE BREAKFAST. 90 tablet 3  . mexiletine (MEXITIL) 150 MG capsule TAKE 2 CAPSULES BY MOUTH EVERY MORNING AND 2 CAPSULES BY MOUTH EVERY EVENING. *NEED OFFICE VISIT* 360 capsule 2  . ranolazine (RANEXA) 500 MG 12 hr tablet Take 1 tablet (500 mg total) by mouth 2 (two) times daily. 60 tablet 10  . sertraline (ZOLOFT) 50 MG tablet Take 1 tablet (50 mg total) by mouth daily. 90 tablet 3   No current  facility-administered medications for this encounter.     Allergies:   Oxycodone and Rifampin   Social History:  The patient  reports that he has never smoked. He has never used smokeless tobacco. He reports that he does not drink alcohol or use drugs.   Family History:  The patient's family history includes Heart disease in his father; Hypertension in his father and mother; Kidney Stones in his sister; Stroke in his father; Thyroid cancer in his sister and sister.   ROS:  Please see the history of present illness.   All other systems are personally reviewed and negative.   Exam:  (Video/Tele Health Call; Exam is subjective and or/visual.) General:  Speaks in full sentences. No resp difficulty. Lungs: Normal respiratory effort with conversation.  Abdomen: Non-distended per patient report Extremities: Pt denies edema. Neuro: Alert & oriented x 3.   Recent Labs: 05/29/2018: ALT 24; BUN 20; Creatinine, Ser 1.08; Hemoglobin 15.2; Platelets 134.0; Potassium 4.5;  Sodium 144; TSH 2.57  Personally reviewed   Wt Readings from Last 3 Encounters:  06/20/18 83.4 kg (183 lb 12.8 oz)  05/29/18 81.4 kg (179 lb 6.4 oz)  02/20/18 83.5 kg (184 lb)      ASSESSMENT AND PLAN:  1. Chronic systolic HF  - NICM St Jude  Echo 5/17 LVEF 25-30%, Mild MR, Mod LAE, RV mildly dilated, Mild RAE. ECHO 12/21/2016 LVEF 25-30%. IW AK RV mild-mod  - Echo today 02/20/18 shows 30-35% AVR stable. Personally reviewed - Very stable NYHA II-early III symptoms - Volume status stable. Weight stable. Continue lasix Mon & Thrusday  - March 2017 CPX with moderate HF limitation.  - CPX 6/18 much improved. Peak VO2: 21.1 (73% predicted peak VO2) VE/VCO2 slope: 35 - Had to stop spiro due to low BP and hyperkalemia - Continue Entresto 97/103 mg BID.  - Continue coreg 6.25 mg BID. Unable to titrate due to low HR - Continue digoxin 0.125 mg daily.  2. VT/NICM - Quiescent on mexilitene/Ranexa - No VT/VF on ICD. Interrogated personally in clinic.  3. HTN - Much improved. Well controlled 4. Valvular heart disease - s/p bentall in 2001 and re-do bentall 2015. Stable on recent echo  - Reinforced need for SBE with all procedures and dental cleanings 5. Depression - Stable.   COVID screen The patient does not have any symptoms that suggest any further testing/ screening at this time.  Social distancing reinforced today.  Recommended follow-up:  As above  Relevant cardiac medications were reviewed at length with the patient today.   The patient does not have concerns regarding their medications at this time.   The following changes were made today:  As above  Today, I have spent 13 minutes with the patient with telehealth technology discussing the above issues .    Signed, Glori Bickers, MD  12/06/2018 11:09 AM  Advanced Heart Failure [place] [address] [phone] (office) [phone] (fax)'

## 2018-12-12 ENCOUNTER — Other Ambulatory Visit: Payer: Self-pay

## 2018-12-12 ENCOUNTER — Ambulatory Visit (INDEPENDENT_AMBULATORY_CARE_PROVIDER_SITE_OTHER): Payer: Medicare HMO | Admitting: *Deleted

## 2018-12-12 DIAGNOSIS — I472 Ventricular tachycardia, unspecified: Secondary | ICD-10-CM

## 2018-12-12 DIAGNOSIS — I428 Other cardiomyopathies: Secondary | ICD-10-CM

## 2018-12-12 LAB — CUP PACEART REMOTE DEVICE CHECK
Date Time Interrogation Session: 20200514181314
Implantable Lead Implant Date: 20160603
Implantable Lead Location: 753860
Implantable Lead Model: 7122
Implantable Pulse Generator Implant Date: 20160603
Pulse Gen Serial Number: 7135169

## 2018-12-17 ENCOUNTER — Encounter: Payer: Self-pay | Admitting: Cardiology

## 2018-12-17 NOTE — Progress Notes (Signed)
Remote ICD transmission.   

## 2018-12-30 ENCOUNTER — Ambulatory Visit (INDEPENDENT_AMBULATORY_CARE_PROVIDER_SITE_OTHER): Payer: Medicare HMO

## 2018-12-30 DIAGNOSIS — I5022 Chronic systolic (congestive) heart failure: Secondary | ICD-10-CM

## 2018-12-30 DIAGNOSIS — Z95810 Presence of automatic (implantable) cardiac defibrillator: Secondary | ICD-10-CM

## 2018-12-31 NOTE — Progress Notes (Signed)
EPIC Encounter for ICM Monitoring  Patient Name: Bradley [name] is a 63 y.o. male Date: 12/31/2018 Primary Care Physican: [name], MD Heart Failure Clinic:[facility] Electrophysiologist: Caryl Comes Nephrologist:Deterding Lastweight183lbs(06/20/2018 office visit)  Transmission reviewed and results sent via mychart.  Thoracic impedance normal.   Prescribed:Furosemide 20 mg 1 tablet every Monday and Thursday.   Labs: 05/29/2018 Creatinine 1.08, BUN 20, Potassium 4.5, Sodium 144, GFR 73.80 02/20/2018 Creatinine 1.11, BUN 16, Potassium 4.8, Sodium 141, GFR >60 09/19/2017 Creatinine 1.09, BUN 16, Potassium 4.9, Sodium 141, GFR >60 08/27/2017 Creatinine 1.25, BUN 25, Potassium 4.9, Sodium 141, GFR 62-72  Recommendations:None  Follow-up plan: ICM clinic phone appointment on7/12/2018.  Copy of ICM check sent to [place] through 12/29/2018    [name], RN 12/31/2018 9:06 AM

## 2019-01-29 ENCOUNTER — Other Ambulatory Visit: Payer: Self-pay | Admitting: Internal Medicine

## 2019-01-29 DIAGNOSIS — I10 Essential (primary) hypertension: Secondary | ICD-10-CM

## 2019-02-03 ENCOUNTER — Ambulatory Visit (INDEPENDENT_AMBULATORY_CARE_PROVIDER_SITE_OTHER): Payer: Medicare HMO

## 2019-02-03 DIAGNOSIS — I5022 Chronic systolic (congestive) heart failure: Secondary | ICD-10-CM

## 2019-02-03 DIAGNOSIS — Z95810 Presence of automatic (implantable) cardiac defibrillator: Secondary | ICD-10-CM | POA: Diagnosis not present

## 2019-02-03 NOTE — Progress Notes (Signed)
EPIC Encounter for ICM Monitoring  Patient Name: Bradley [name] is a 63 y.o. male Date: 02/03/2019 Primary Care Physican: [name], MD Heart Failure Clinic:[facility] Electrophysiologist: Caryl Comes Nephrologist:Deterding 02/03/2019 [id number]  Spoke with wife and heart questions reviewed.  No fluid symptoms or dryness symptoms noted. She said he has not been eating as much lately.  He is bipolar and there are times he does not eat or drink the amounts he needs.   CorVue thoracic impedance normal but was suggesting possible dryness 6/21 - 7/4.   Prescribed:Furosemide 20 mg 1 tablet every Monday and Thursday.   Labs: 05/29/2018 Creatinine 1.08, BUN 20, Potassium 4.5, Sodium 144, GFR 73.80 02/20/2018 Creatinine 1.11, BUN 16, Potassium 4.8, Sodium 141, GFR >60 09/19/2017 Creatinine 1.09, BUN 16, Potassium 4.9, Sodium 141, GFR >60 08/27/2017 Creatinine 1.25, BUN 25, Potassium 4.9, Sodium 141, GFR 62-72  Recommendations:No changes and encourage to call if he experiences fluid symptoms.   Follow-up plan: ICM clinic phone appointment on8/07/2019.  Copy of ICM check sent to [place]  3 month ICM trend: 02/03/2019    1 Year ICM trend:       [name], RN 02/03/2019 4:13 PM

## 2019-02-09 ENCOUNTER — Other Ambulatory Visit: Payer: Self-pay | Admitting: Family Medicine

## 2019-02-10 NOTE — Telephone Encounter (Signed)
Last OV 05/29/18, No future OV  Last filled 11/07/18, # 30 with 0 refills

## 2019-02-12 ENCOUNTER — Other Ambulatory Visit (HOSPITAL_COMMUNITY): Payer: Self-pay | Admitting: Internal Medicine

## 2019-02-28 ENCOUNTER — Telehealth: Payer: Self-pay

## 2019-02-28 NOTE — Telephone Encounter (Signed)
Called wife [name] and have her scheduled for a Doxy. Also sent Ensure Rx to [facility] for patient. Also scheduled Mr. Parekh for CPE on 05/30/19 at 11:15am. Wife verbalized an understanding.  Copied from [place] [id number]. Topic: General - Other >> Feb 26, 2019 11:08 AM Yvette Rack wrote: Reason for CRM: Pt wife [name] called to request that Dr. Elease Hashimoto sends an approval for Ensure to [facility] @ fax# [phone]. [name] also would like to be contacted to schedule an appt for a refill. Attempted to transfer to the office but there was no answer. Pt wife requests call back to schedule appt. Cb# [phone]

## 2019-03-04 ENCOUNTER — Other Ambulatory Visit: Payer: Self-pay

## 2019-03-04 MED ORDER — ENSURE PO LIQD: 237.0000 mL | Freq: Every day | ORAL | 12 refills | Status: DC | PRN

## 2019-03-12 ENCOUNTER — Ambulatory Visit (INDEPENDENT_AMBULATORY_CARE_PROVIDER_SITE_OTHER): Payer: Medicare HMO

## 2019-03-12 ENCOUNTER — Other Ambulatory Visit: Payer: Self-pay | Admitting: Family Medicine

## 2019-03-12 DIAGNOSIS — I5022 Chronic systolic (congestive) heart failure: Secondary | ICD-10-CM

## 2019-03-12 DIAGNOSIS — Z95810 Presence of automatic (implantable) cardiac defibrillator: Secondary | ICD-10-CM | POA: Diagnosis not present

## 2019-03-13 ENCOUNTER — Ambulatory Visit (INDEPENDENT_AMBULATORY_CARE_PROVIDER_SITE_OTHER): Payer: Medicare HMO | Admitting: *Deleted

## 2019-03-13 DIAGNOSIS — I472 Ventricular tachycardia, unspecified: Secondary | ICD-10-CM

## 2019-03-14 LAB — CUP PACEART REMOTE DEVICE CHECK
Battery Remaining Longevity: 58 mo
Battery Remaining Percentage: 56 %
Battery Voltage: 2.92 V
Brady Statistic RV Percent Paced: 1 %
Date Time Interrogation Session: 20200814153048
HighPow Impedance: 61 Ohm
HighPow Impedance: 61 Ohm
Implantable Lead Implant Date: 20160603
Implantable Lead Location: 753860
Implantable Lead Model: 7122
Implantable Pulse Generator Implant Date: 20160603
Lead Channel Impedance Value: 360 Ohm
Lead Channel Pacing Threshold Amplitude: 1.25 V
Lead Channel Pacing Threshold Pulse Width: 0.5 ms
Lead Channel Sensing Intrinsic Amplitude: 11.7 mV
Lead Channel Setting Pacing Amplitude: 2.5 V
Lead Channel Setting Pacing Pulse Width: 0.5 ms
Lead Channel Setting Sensing Sensitivity: 0.5 mV
Pulse Gen Serial Number: 7135169

## 2019-03-14 NOTE — Progress Notes (Signed)
EPIC Encounter for ICM Monitoring  Patient Name: Bradley [name] is a 63 y.o. male Date: 03/14/2019 Primary Care Physican: [name], MD Heart Failure Clinic:[facility] Electrophysiologist: Caryl Comes Nephrologist:Deterding 02/03/2019 [id number]  Attempted call to wife and unable to reach.  Left detailed message per DPR regarding transmission. Transmission reviewed.   CorVue thoracic impedance normal but was suggesting possible fluid accumulation 02/05/2019 - 02/17/2019 and 02/24/2019 -03/03/2019.   Prescribed:Furosemide 20 mg 1 tablet every Monday and Thursday.   Labs: 10/30/2019Creatinine 1.08, BUN 20, Potassium 4.5, Sodium 144, GFR 73.80 02/20/2018 Creatinine 1.11, BUN 16, Potassium 4.8, Sodium 141, GFR >60 09/19/2017 Creatinine 1.09, BUN 16, Potassium 4.9, Sodium 141, GFR >60 08/27/2017 Creatinine 1.25, BUN 25, Potassium 4.9, Sodium 141, GFR 62-72  Recommendations:Left voice mail with ICM number and encouraged to call if experiencing any fluid symptoms.   Follow-up plan: ICM clinic phone appointment on10/12/2018.  OV scheduled with Dr Caryl Comes 04/04/2019.  Copy of ICM check sent to [place]   3 month ICM trend: 03/12/2019    1 Year ICM trend:       Bradley Billings, RN 03/14/2019 10:12 AM

## 2019-03-21 ENCOUNTER — Encounter: Payer: Self-pay | Admitting: Cardiology

## 2019-03-21 NOTE — Progress Notes (Signed)
Remote ICD transmission.   

## 2019-04-04 ENCOUNTER — Encounter: Payer: Self-pay | Admitting: Internal Medicine

## 2019-04-04 ENCOUNTER — Other Ambulatory Visit: Payer: Self-pay

## 2019-04-04 ENCOUNTER — Ambulatory Visit (INDEPENDENT_AMBULATORY_CARE_PROVIDER_SITE_OTHER): Payer: Medicare HMO | Admitting: Internal Medicine

## 2019-04-04 VITALS — BP 110/50 | HR 52 | Ht 73.0 in | Wt 179.6 lb

## 2019-04-04 DIAGNOSIS — Z95810 Presence of automatic (implantable) cardiac defibrillator: Secondary | ICD-10-CM

## 2019-04-04 DIAGNOSIS — I472 Ventricular tachycardia, unspecified: Secondary | ICD-10-CM

## 2019-04-04 DIAGNOSIS — I10 Essential (primary) hypertension: Secondary | ICD-10-CM | POA: Diagnosis not present

## 2019-04-04 DIAGNOSIS — I5022 Chronic systolic (congestive) heart failure: Secondary | ICD-10-CM | POA: Diagnosis not present

## 2019-04-04 DIAGNOSIS — I428 Other cardiomyopathies: Secondary | ICD-10-CM | POA: Diagnosis not present

## 2019-04-04 DIAGNOSIS — I110 Hypertensive heart disease with heart failure: Secondary | ICD-10-CM | POA: Diagnosis not present

## 2019-04-04 MED ORDER — FUROSEMIDE 20 MG PO TABS: 20.0000 mg | ORAL_TABLET | ORAL | 0 refills | Status: DC

## 2019-04-04 NOTE — Patient Instructions (Signed)
Medication Instructions:  Your physician has recommended you make the following change in your medication:   1 Increase your lasix, 20mg  tablet, 4 times per week.    Labwork: You will have labs drawn today: BMP and Digoxin level   Testing/Procedures: None ordered.  Follow-Up: Your physician recommends that you schedule a follow-up appointment in:   12 months with Dr. Caryl Comes  Any Other Special Instructions Will Be Listed Below (If Applicable).     If you need a refill on your cardiac medications before your next appointment, please call your pharmacy.

## 2019-04-04 NOTE — Progress Notes (Signed)
Patient Care Team: [name], MD as PCP - General Fay Records, MD as PCP - Cardiology (Cardiology) [name], MD as Consulting Physician (Cardiothoracic Surgery) Deterding, Jeneen Rinks, MD as Consulting Physician (Nephrology) Deboraha Sprang, MD as Consulting Physician (Cardiology)   HPI  Bradley [name] is a 63 y.o. male Seen in follow-up for implantable defibrillator St Jude  for sustained ventricular tachycardia 5/16 occurring with a frequency that required adjunctive medical therapy. He ended up on amiodarone but then switched to mex and ranolazine.   A balloon valvotomy at age 30 for presumed congenital aortic stenosis   Bentall procedure in 2001 with a pericardial tissue valve. He had MSSA bacteremia and bacterial endocarditis in 11/7844 complicated by renal failure requiring dialysis for a short period of time. He was then readmitted 09/2013 with sternoclavicular osteomyelitis and was found to have perivalvular abscess requiring redo Bentall with homograft and debridement of root abscess     History of appropriate therapy: yes - ATP Antiarrhythmics Date Reason stopped   Mexiletine  2016 ongoing  Ranolazine 2016  ongoing   Date Cr K TSH  10/19 1.08 4.5 2.57         DATE TEST EF   10/14 Echo  60-65 %   6/16 Echo  30-35 %   5/18 Echo  25-30%   7/19 Echo  30-35%    The patient denies chest pain, shortness of breath, nocturnal dyspnea, orthopnea or peripheral edema.  There have been no palpitations, lightheadedness or syncope.      Past Medical History:  Diagnosis Date  . Anxiety   . Aortic stenosis    s/p Bentall with bioprosthetic AVR 02/2010; Last echo (9/11): Moderate LVH, EF 45-50%, AVR functioning appropriately, aortic valve mean gradient 21, diastolic dysfunction. Chest MRA (2/13): Mild to moderate dilatation at the sinus of Valsalva at 4.1 cm, mild dilatation ascending aorta distal to the tube graft at 3.9 cm, moderate dilatation of the  innominate artery a 2.1 cm;    . Depression   . ESRD on dialysis [facility])    a. 09/2013 felt to be related to gentamycin b. no longer requiring HD  . Hx of cardiac cath    a. LHC in 02/2010: normal cors  . Hx of echocardiogram 02/2014   Echo (8/15):  Mod LV, EF 35-40%, Gr 1 DD, AVR ok (mean 14 mmHg), mild LAE  . Hypertension   . Hypothyroidism, postsurgical   . Prosthetic valve endocarditis ([place])   . Staphylococcus aureus bacteremia   . Thyroid cancer ([place])   . Ventricular tachycardia ([place])    a. s/p STJ ICD    Past Surgical History:  Procedure Laterality Date  . AORTIC VALVE REPAIR  1968  . AORTIC VALVE REPLACEMENT  2011  . AV FISTULA PLACEMENT Left 10/08/2013   Procedure: ARTERIOVENOUS (AV) FISTULA CREATION- LEFT ARM; Radial Cephalic ;  Surgeon: Mal [name], MD;  Location: [place];  Service: Vascular;  Laterality: Left;  . BENTALL PROCEDURE N/A 10/28/2013   Procedure: REDO BENTALL PROCEDURE, debridment of aoritc root abscess, replacement of aortic root, ascending aorta and aortic valve with homograft. Insertion of left femoral arterial line;  Surgeon: [name], MD;  Location: [place];  Service: Open Heart Surgery;  Laterality: N/A;  . CARDIAC CATHETERIZATION  03/02/2010   NORMAL CORONARY ARTERY  . CARDIAC CATHETERIZATION N/A 01/01/2015   Procedure: Coronary/Graft Angiography;  Surgeon: Troy Sine, MD;  Location: [place] CV LAB;  Service: Cardiovascular;  Laterality: N/A;  . CARDIAC VALVE REPLACEMENT    . EP IMPLANTABLE DEVICE N/A 01/01/2015   Procedure: ICD Implant;  Surgeon: Deboraha Sprang, MD;  Location: [place] CV LAB;  Service: Cardiovascular;  Laterality: N/A;  . INTRAOPERATIVE TRANSESOPHAGEAL ECHOCARDIOGRAM N/A 10/28/2013   Procedure: INTRAOPERATIVE TRANSESOPHAGEAL ECHOCARDIOGRAM;  Surgeon: [name], MD;  Location: [place];  Service: Open Heart Surgery;  Laterality: N/A;  . PERIPHERAL VASCULAR CATHETERIZATION N/A 01/01/2015   Procedure: Aortic Arch Angiography;   Surgeon: Troy Sine, MD;  Location: [place] CV LAB;  Service: Cardiovascular;  Laterality: N/A;  . SHOULDER ARTHROSCOPY W/ ROTATOR CUFF REPAIR Right 2012  . STERNOTOMY     REDO  . TEE WITHOUT CARDIOVERSION N/A 10/24/2013   Procedure: TRANSESOPHAGEAL ECHOCARDIOGRAM (TEE);  Surgeon: Dorothy Spark, MD;  Location: [facility] ENDOSCOPY;  Service: Cardiovascular;  Laterality: N/A;  . THYROIDECTOMY  ~ 2005  . TRANSTHORACIC ECHOCARDIOGRAM  03/2010   SHOWED MILD REDUCTION OF LV FUNCTION    Current Outpatient Medications  Medication Sig Dispense Refill  . aspirin EC 81 MG tablet Take 81 mg by mouth daily.     Marland Kitchen atorvastatin (LIPITOR) 40 MG tablet TAKE 1 TABLET BY MOUTH EVERY DAY 90 tablet 0  . carvedilol (COREG) 6.25 MG tablet Take 1 tablet (6.25 mg total) by mouth 2 (two) times daily with a meal. 180 tablet 3  . clonazePAM (KLONOPIN) 1 MG tablet TAKE 1 TABLET BY MOUTH TWICE A DAY AS NEEDED *NOT FOR REGULAR USE* 30 tablet 0  . digoxin (LANOXIN) 0.125 MG tablet Take 1 tablet (125 mcg total) by mouth daily. 90 tablet 3  . docusate sodium (STOOL SOFTENER) 100 MG capsule Take 100 mg by mouth 2 (two) times daily.    . Ensure (ENSURE) Take 237 mLs by mouth daily as needed. 237 mL 12  . ENTRESTO 97-103 MG TAKE 1 TABLET BY MOUTH TWICE A DAY 180 tablet 1  . furosemide (LASIX) 20 MG tablet Take 1 tablet (20 mg total) by mouth 2 (two) times a week. Please make annual appt with Dr. Harrington Challenger for future refills. Thank you. 1st attempt 24 tablet 0  . levothyroxine (SYNTHROID, LEVOTHROID) 175 MCG tablet TAKE 1 TABLET (175 MCG TOTAL) BY MOUTH DAILY BEFORE BREAKFAST. 90 tablet 3  . mexiletine (MEXITIL) 150 MG capsule TAKE 2 CAPSULES BY MOUTH EVERY MORNING AND 2 CAPSULES BY MOUTH EVERY EVENING. *NEED OFFICE VISIT* 360 capsule 2  . ranolazine (RANEXA) 500 MG 12 hr tablet Take 1 tablet (500 mg total) by mouth 2 (two) times daily. 60 tablet 10  . sertraline (ZOLOFT) 50 MG tablet Take 1 tablet (50 mg total) by mouth daily. 90  tablet 3   No current facility-administered medications for this visit.     Allergies  Allergen Reactions  . Oxycodone Other (See Comments)    Gives patient nightmares  . Rifampin Nausea Only      Review of Systems negative except from HPI and PMH  Physical Exam BP (!) 110/50   Pulse (!) 52   Ht 6\' 1"  (1.854 m)   Wt 179 lb 9.6 oz (81.5 kg)   SpO2 99%   BMI 23.70 kg/m  Well developed and well nourished in no acute distress HENT normal Neck supple with JVP-flat Clear Device pocket well healed; without hematoma or erythema.  There is no tethering  Regular rate and rhythm, no  gallop 2/6 murmur Abd-soft with active BS No Clubbing cyanosis   edema Skin-warm  and dry A & Oriented  Grossly normal sensory and motor function     Assessment and  Plan  Aortic Valve Disease with valvotomy, Bentall 2011 with redo Bentall 2015  Ventricular tachycarda  ICD St Jude  The patient's device was interrogated.  The information was reviewed. No changes were made in the programming.        NICM  CHF chronic systolic    Euvolemic continue current meds  No intercurrent Ventricular tachycardia  Tolerating meds

## 2019-04-05 LAB — BASIC METABOLIC PANEL
BUN/Creatinine Ratio: 18 (ref 10–24)
BUN: 18 mg/dL (ref 8–27)
CO2: 24 mmol/L (ref 20–29)
Calcium: 9.2 mg/dL (ref 8.6–10.2)
Chloride: 103 mmol/L (ref 96–106)
Creatinine, Ser: 1.01 mg/dL (ref 0.76–1.27)
GFR calc Af Amer: 92 mL/min/{1.73_m2} (ref >59)
GFR calc non Af Amer: 79 mL/min/{1.73_m2} (ref >59)
Glucose: 80 mg/dL (ref 65–99)
Potassium: 4.1 mmol/L (ref 3.5–5.2)
Sodium: 142 mmol/L (ref 134–144)

## 2019-04-05 LAB — DIGOXIN LEVEL: Digoxin, Serum: 0.8 ng/mL (ref 0.5–0.9)

## 2019-04-07 NOTE — Progress Notes (Signed)
Date:  04/08/2019   ID:  Bradley [name], DOB 18-Jan-1956, MRN [id number]  Location: Home  Provider location: [facility] Type of Visit: Established patient  PCP:  [name], MD  Cardiologist:  Dorris Carnes, MD Primary HF: [facility] EP: Dr Leandro Reasoner.  Chief Complaint: Heart Failure follow-up   History of Present Illness:  Bradley [name] is a 63 y/o male with a complicated past medical history including thyroid cancer (s/p thyroidectomy), hypertension, depression, and congenital aortic stenosis for which he underwent aortic vavulotomy at age 33 with subsequent Bentall procedure in 2001 with a pericardial tissue valve. He had MSSA bacteremia and bacterial endocarditis in 0/9983 complicated by renal failure requiring dialysis for a short period of time. 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   He was admitted 11/2014 with progressive episodes of palpitations and pre-syncope. He was found to have monomorphic ventricular tachycardia. Catheterization demonstrated normal coronaries. Echocardiogram demonstrated EF 30-35% and he underwent STJ dual chamber ICD implant.  Today he returns for HF follow up.Say Dr Caryl Comes last week and lasix was increased to 20 mg 4 times a week. Having some right groin discomfort but plans to follow up with PCP. Overall feeling fine. He has mild SOB with steps.  Denies PND/Orthopnea. Appetite ok. No fever or chills. Weight at home 172-176 pounds. Taking all medications. Active at home taking care of his peacocks and horses.   Studies: Echo 7/19  30-35% AVR stable. Personally reviewed Echo 5/18 EF 25-30%   CPX 01/01/17 FEV1 3.40 (86%)     FEV1/FVC 83 (107%)     MVV 163 (108%) Resting HR: 72 Peak HR: 129  (80% age predicted max HR) BP rest: 118/70 BP peak: 174/84 Peak VO2: 21.1 (73%  predicted peak VO2) VE/VCO2 slope: 35 OUES: 1.96 Peak RER: 1.22 Ventilatory Threshold: 16.6 (58% predicted or measured peak VO2) VE/MVV: 50% O2pulse: 15  (100% predicted O2pulse)    Past Medical History:  Diagnosis Date  . Anxiety   . Aortic stenosis    s/p Bentall with bioprosthetic AVR 02/2010; Last echo (9/11): Moderate LVH, EF 45-50%, AVR functioning appropriately, aortic valve mean gradient 21, diastolic dysfunction. Chest MRA (2/13): Mild to moderate dilatation at the sinus of Valsalva at 4.1 cm, mild dilatation ascending aorta distal to the tube graft at 3.9 cm, moderate dilatation of the innominate artery a 2.1 cm;    . Depression   . ESRD on dialysis [facility])    a. 09/2013 felt to be related to gentamycin b. no longer requiring HD  . Hx of cardiac cath    a. LHC in 02/2010: normal cors  . Hx of echocardiogram 02/2014   Echo (8/15):  Mod LV, EF 35-40%, Gr 1 DD, AVR ok (mean 14 mmHg), mild LAE  . Hypertension   . Hypothyroidism, postsurgical   . Prosthetic valve endocarditis ([place])   . Staphylococcus aureus bacteremia   . Thyroid cancer ([place])   . Ventricular tachycardia ([place])    a. s/p STJ ICD   Past Surgical History:  Procedure Laterality Date  . AORTIC VALVE REPAIR  1968  . AORTIC VALVE REPLACEMENT  2011  . AV FISTULA PLACEMENT Left 10/08/2013   Procedure: ARTERIOVENOUS (AV) FISTULA CREATION- LEFT ARM; Radial Cephalic ;  Surgeon: Mal [name], MD;  Location: [place];  Service: Vascular;  Laterality: Left;  . BENTALL PROCEDURE  N/A 10/28/2013   Procedure: REDO BENTALL PROCEDURE, debridment of aoritc root abscess, replacement of aortic root, ascending aorta and aortic valve with homograft. Insertion of left femoral arterial line;  Surgeon: [name], MD;  Location: [place];  Service: Open Heart Surgery;  Laterality: N/A;  . CARDIAC CATHETERIZATION  03/02/2010   NORMAL CORONARY ARTERY  . CARDIAC CATHETERIZATION N/A 01/01/2015   Procedure: Coronary/Graft Angiography;   Surgeon: Troy Sine, MD;  Location: [place] CV LAB;  Service: Cardiovascular;  Laterality: N/A;  . CARDIAC VALVE REPLACEMENT    . EP IMPLANTABLE DEVICE N/A 01/01/2015   Procedure: ICD Implant;  Surgeon: Deboraha Sprang, MD;  Location: [place] CV LAB;  Service: Cardiovascular;  Laterality: N/A;  . INTRAOPERATIVE TRANSESOPHAGEAL ECHOCARDIOGRAM N/A 10/28/2013   Procedure: INTRAOPERATIVE TRANSESOPHAGEAL ECHOCARDIOGRAM;  Surgeon: [name], MD;  Location: [place];  Service: Open Heart Surgery;  Laterality: N/A;  . PERIPHERAL VASCULAR CATHETERIZATION N/A 01/01/2015   Procedure: Aortic Arch Angiography;  Surgeon: Troy Sine, MD;  Location: [place] [facility] LAB;  Service: Cardiovascular;  Laterality: N/A;  . SHOULDER ARTHROSCOPY W/ ROTATOR CUFF REPAIR Right 2012  . STERNOTOMY     REDO  . TEE WITHOUT CARDIOVERSION N/A 10/24/2013   Procedure: TRANSESOPHAGEAL ECHOCARDIOGRAM (TEE);  Surgeon: Dorothy Spark, MD;  Location: [facility] ENDOSCOPY;  Service: Cardiovascular;  Laterality: N/A;  . THYROIDECTOMY  ~ 2005  . TRANSTHORACIC ECHOCARDIOGRAM  03/2010   SHOWED MILD REDUCTION OF LV FUNCTION     Current Outpatient Medications  Medication Sig Dispense Refill  . aspirin EC 81 MG tablet Take 81 mg by mouth daily.     Marland Kitchen atorvastatin (LIPITOR) 40 MG tablet TAKE 1 TABLET BY MOUTH EVERY DAY 90 tablet 0  . carvedilol (COREG) 6.25 MG tablet Take 1 tablet (6.25 mg total) by mouth 2 (two) times daily with a meal. 180 tablet 3  . clonazePAM (KLONOPIN) 1 MG tablet TAKE 1 TABLET BY MOUTH TWICE A DAY AS NEEDED *NOT FOR REGULAR USE* 30 tablet 0  . digoxin (LANOXIN) 0.125 MG tablet Take 1 tablet (125 mcg total) by mouth daily. 90 tablet 3  . docusate sodium (STOOL SOFTENER) 100 MG capsule Take 100 mg by mouth 2 (two) times daily.    . Ensure (ENSURE) Take 237 mLs by mouth daily as needed. 237 mL 12  . ENTRESTO 97-103 MG TAKE 1 TABLET BY MOUTH TWICE A DAY 180 tablet 1  . furosemide (LASIX) 20 MG tablet Take 1  tablet (20 mg total) by mouth 4 (four) times a week. 24 tablet 0  . levothyroxine (SYNTHROID, LEVOTHROID) 175 MCG tablet TAKE 1 TABLET (175 MCG TOTAL) BY MOUTH DAILY BEFORE BREAKFAST. 90 tablet 3  . mexiletine (MEXITIL) 150 MG capsule TAKE 2 CAPSULES BY MOUTH EVERY MORNING AND 2 CAPSULES BY MOUTH EVERY EVENING. *NEED OFFICE VISIT* 360 capsule 2  . ranolazine (RANEXA) 500 MG 12 hr tablet Take 1 tablet (500 mg total) by mouth 2 (two) times daily. 60 tablet 10  . sertraline (ZOLOFT) 50 MG tablet Take 1 tablet (50 mg total) by mouth daily. 90 tablet 3   No current facility-administered medications for this encounter.     Allergies:   Oxycodone and Rifampin   Social History:  The patient  reports that he has never smoked. He has never used smokeless tobacco. He reports that he does not drink alcohol or use drugs.   Family History:  The patient's family history includes Heart disease in his father;  Hypertension in his father and mother; Kidney Stones in his sister; Stroke in his father; Thyroid cancer in his sister and sister.   ROS:  Please see the history of present illness.   All other systems are personally reviewed and negative.  Today's Vitals   04/08/19 1108  BP: 130/76  Pulse: (!) 56  SpO2: 99%  Weight: 83 kg (183 lb)   Body mass index is 24.14 kg/m.  Wt Readings from Last 3 Encounters:  04/08/19 83 kg (183 lb)  04/04/19 81.5 kg (179 lb 9.6 oz)  06/20/18 83.4 kg (183 lb 12.8 oz)    Exam:  General:  Well appearing. No resp difficulty HEENT: normal Neck: supple. no JVD. Carotids 2+ bilat; no bruits. No lymphadenopathy or thryomegaly appreciated. Cor: PMI nondisplaced. Regular rate & rhythm. No rubs, gallops or murmurs. Lungs: clear Abdomen: soft, nontender, nondistended. No hepatosplenomegaly. No bruits or masses. Good bowel sounds. Extremities: no cyanosis, clubbing, rash, edema Neuro: alert & orientedx3, cranial nerves grossly intact. moves all 4 extremities w/o difficulty.  Affect pleasant  EKG: Sinus Bradycardia 52 bpm   Recent Labs: 05/29/2018: ALT 24; Hemoglobin 15.2; Platelets 134.0; TSH 2.57 04/04/2019: BUN 18; Creatinine, Ser 1.01; Potassium 4.1; Sodium 142     Wt Readings from Last 3 Encounters:  04/08/19 83 kg (183 lb)  04/04/19 81.5 kg (179 lb 9.6 oz)  06/20/18 83.4 kg (183 lb 12.8 oz)      ASSESSMENT AND PLAN:  1. Chronic systolic HF  - NICM St Jude  Echo 5/17 LVEF 25-30%, Mild MR, Mod LAE, RV mildly dilated, Mild RAE. ECHO 12/21/2016 LVEF 25-30%. IW AK RV mild-mod  - Echo 02/20/18 shows 30-35% AVR stable.  -  March 2017 CPX with moderate HF limitation.  - CPX 6/18 much improved. Peak VO2: 21.1 (73% predicted peak VO2) VE/VCO2 slope: 35 - NYHA II. Volume status stable. Continue lasix 20 mg 4 times a week.  -Had to stop spiro due to low BP and hyperkalemia - Continue Entresto 97/103 mg BID.  - Continue coreg 6.25 mg BID. Unable to titrate due to low HR - Continue digoxin 0.125 mg daily.  - I reviewed BMET from last week Stable.  2. VT/NICM - Quiescent on mexilitene/Ranexa - Followed in the device clinic and by Dr Caryl Comes.  3. HTN -Stable.  4. Valvular heart disease - s/p bentall in 2001 and re-do bentall 2015. Stable on recent echo  - Reinforced need for SBE with all procedures and dental cleanings 5. Depression - Stable.    Follow up with Dr Haroldine Laws in 3-4 months with an ECHO.   Jeanmarie Hubert, NP  04/08/2019 11:48 AM  Advanced [facility] Failure [place] [address] [phone] (office) [phone] (fax)'

## 2019-04-08 ENCOUNTER — Other Ambulatory Visit: Payer: Self-pay

## 2019-04-08 ENCOUNTER — Ambulatory Visit (HOSPITAL_COMMUNITY)
Admission: RE | Admit: 2019-04-08 | Discharge: 2019-04-08 | Disposition: A | Discharge status: Home / Self Care | Payer: Medicare HMO | Source: Ambulatory Visit | Attending: Internal Medicine | Admitting: Internal Medicine

## 2019-04-08 ENCOUNTER — Encounter (HOSPITAL_COMMUNITY): Payer: Self-pay

## 2019-04-08 ENCOUNTER — Encounter (HOSPITAL_COMMUNITY): Payer: Medicare HMO

## 2019-04-08 VITALS — BP 130/76 | HR 56 | Wt 183.0 lb

## 2019-04-08 DIAGNOSIS — Z85850 Personal history of malignant neoplasm of thyroid: Secondary | ICD-10-CM | POA: Insufficient documentation

## 2019-04-08 DIAGNOSIS — I132 Hypertensive heart and chronic kidney disease with heart failure and with stage 5 chronic kidney disease, or end stage renal disease: Secondary | ICD-10-CM | POA: Insufficient documentation

## 2019-04-08 DIAGNOSIS — I5022 Chronic systolic (congestive) heart failure: Secondary | ICD-10-CM | POA: Diagnosis not present

## 2019-04-08 DIAGNOSIS — Z953 Presence of xenogenic heart valve: Secondary | ICD-10-CM | POA: Diagnosis not present

## 2019-04-08 DIAGNOSIS — F419 Anxiety disorder, unspecified: Secondary | ICD-10-CM | POA: Insufficient documentation

## 2019-04-08 DIAGNOSIS — I454 Nonspecific intraventricular block: Secondary | ICD-10-CM | POA: Insufficient documentation

## 2019-04-08 DIAGNOSIS — Z992 Dependence on renal dialysis: Secondary | ICD-10-CM | POA: Insufficient documentation

## 2019-04-08 DIAGNOSIS — I350 Nonrheumatic aortic (valve) stenosis: Secondary | ICD-10-CM | POA: Diagnosis not present

## 2019-04-08 DIAGNOSIS — Z7982 Long term (current) use of aspirin: Secondary | ICD-10-CM | POA: Diagnosis not present

## 2019-04-08 DIAGNOSIS — Z8249 Family history of ischemic heart disease and other diseases of the circulatory system: Secondary | ICD-10-CM | POA: Diagnosis not present

## 2019-04-08 DIAGNOSIS — I472 Ventricular tachycardia: Secondary | ICD-10-CM | POA: Diagnosis not present

## 2019-04-08 DIAGNOSIS — Z79899 Other long term (current) drug therapy: Secondary | ICD-10-CM | POA: Diagnosis not present

## 2019-04-08 DIAGNOSIS — Z79890 Hormone replacement therapy (postmenopausal): Secondary | ICD-10-CM | POA: Insufficient documentation

## 2019-04-08 DIAGNOSIS — I428 Other cardiomyopathies: Secondary | ICD-10-CM | POA: Insufficient documentation

## 2019-04-08 DIAGNOSIS — R001 Bradycardia, unspecified: Secondary | ICD-10-CM | POA: Diagnosis not present

## 2019-04-08 DIAGNOSIS — Z885 Allergy status to narcotic agent status: Secondary | ICD-10-CM | POA: Insufficient documentation

## 2019-04-08 DIAGNOSIS — E875 Hyperkalemia: Secondary | ICD-10-CM | POA: Diagnosis not present

## 2019-04-08 DIAGNOSIS — I10 Essential (primary) hypertension: Secondary | ICD-10-CM

## 2019-04-08 DIAGNOSIS — Z800 Family history of malignant neoplasm of digestive organs: Secondary | ICD-10-CM | POA: Insufficient documentation

## 2019-04-08 DIAGNOSIS — F329 Major depressive disorder, single episode, unspecified: Secondary | ICD-10-CM | POA: Insufficient documentation

## 2019-04-08 DIAGNOSIS — R9431 Abnormal electrocardiogram [ECG] [EKG]: Secondary | ICD-10-CM | POA: Insufficient documentation

## 2019-04-08 DIAGNOSIS — I5042 Chronic combined systolic (congestive) and diastolic (congestive) heart failure: Secondary | ICD-10-CM

## 2019-04-08 DIAGNOSIS — N186 End stage renal disease: Secondary | ICD-10-CM | POA: Insufficient documentation

## 2019-04-08 DIAGNOSIS — E890 Postprocedural hypothyroidism: Secondary | ICD-10-CM | POA: Diagnosis not present

## 2019-04-08 DIAGNOSIS — I38 Endocarditis, valve unspecified: Secondary | ICD-10-CM | POA: Diagnosis not present

## 2019-04-08 DIAGNOSIS — Z823 Family history of stroke: Secondary | ICD-10-CM | POA: Insufficient documentation

## 2019-04-08 NOTE — Patient Instructions (Signed)
Your physician has requested that you have an echocardiogram. Echocardiography is a painless test that uses sound waves to create images of your heart. It provides your doctor with information about the size and shape of your heart and how well your heart's chambers and valves are working. This procedure takes approximately one hour. There are no restrictions for this procedure. This will be done at your next appointment.  Please follow up with the [facility] in 3-4 months.  At the [facility], you and your health needs are our priority. As part of our continuing mission to provide you with exceptional heart care, we have created designated Provider Care Teams. These Care Teams include your primary Cardiologist (physician) and Advanced Practice Providers (APPs- Physician Assistants and Nurse Practitioners) who all work together to provide you with the care you need, when you need it.   You may see any of the following providers on your designated Care Team at your next follow up: Marland Kitchen Dr Glori Bickers . Dr Loralie Champagne . Darrick Grinder, NP   Please be sure to bring in all your medications bottles to every appointment.

## 2019-04-21 ENCOUNTER — Other Ambulatory Visit: Payer: Self-pay | Admitting: Internal Medicine

## 2019-04-22 NOTE — Telephone Encounter (Signed)
Pt's pharmacy is requesting a refill on levothyroxine. Would Dr. Ross like to refill this medication? Please address °

## 2019-04-25 ENCOUNTER — Telehealth: Payer: Self-pay

## 2019-04-25 ENCOUNTER — Other Ambulatory Visit: Payer: Self-pay | Admitting: Family Medicine

## 2019-04-25 MED ORDER — ENSURE PO LIQD: 237.0000 mL | Freq: Every day | ORAL | 12 refills | Status: DC | PRN

## 2019-04-25 NOTE — Telephone Encounter (Signed)
Copied from [place] [id number]. Topic: General - Inquiry >> Apr 25, 2019 11:06 AM Reyne Dumas L wrote: Reason for CRM:   Pt's wife calling.  States that they got a letter in the mail that it is time for pt to get his Cologuard test redone.  Pt's wife wants to know if Dr. Elease Hashimoto can order that.

## 2019-04-25 NOTE — Telephone Encounter (Signed)
OK to send.

## 2019-04-25 NOTE — Telephone Encounter (Signed)
yes

## 2019-04-25 NOTE — Telephone Encounter (Signed)
Medication Refill - Medication: Ensure (ENSURE)  Has the patient contacted their pharmacy? Yes - hasn't heard from office or pharmacy and pt has been without for three months (Agent: If no, request that the patient contact the pharmacy for the refill.) (Agent: If yes, when and what did the pharmacy advise?)  Preferred Pharmacy (with phone number or street name): [facility], [address] (fax number: [phone]).  If it can't be sent then pt's wife would like to come into office to pick up physical RX.  Agent: Please be advised that RX refills may take up to 3 business days. We ask that you follow-up with your pharmacy.

## 2019-04-28 ENCOUNTER — Other Ambulatory Visit: Payer: Self-pay | Admitting: Internal Medicine

## 2019-04-28 DIAGNOSIS — I10 Essential (primary) hypertension: Secondary | ICD-10-CM

## 2019-04-28 NOTE — Telephone Encounter (Signed)
Pt's spouse called in to follow up with provider about pt's colo guard results. They said that it was positive for blood. They are not sure of what that mean and would like to discuss results directly.   They would like a call back at:    CB: [phone] - home   [phone] -

## 2019-04-30 NOTE — Telephone Encounter (Signed)
Pt's wife is calling and stated she still hasn't received a call regarding results of color guard test

## 2019-05-02 NOTE — Telephone Encounter (Signed)
Spoke with spouse. Per spouse this was not done through Cologuard. This was done through [facility] and was positive. She would like to know the next steps. Spouse advised to upload a copy of the results to MyChart so we can have proof. Please advise what the next steps are

## 2019-05-04 NOTE — Telephone Encounter (Signed)
We do need to get results from the insurance company but whether this was hemoccult or cologuard sounds like he will need GI referral for colonoscopy.

## 2019-05-06 NOTE — Telephone Encounter (Signed)
Called LVM. Ria Comment please let wife know that insurance may not cover the cologaurd since they had test done through the insurance. We do need proof of the other positive stool test to get him into GI ASAP.

## 2019-05-06 NOTE — Telephone Encounter (Signed)
Spoke with daughter to let her know the positive result is needed to place the referral

## 2019-05-08 DIAGNOSIS — I5042 Chronic combined systolic (congestive) and diastolic (congestive) heart failure: Secondary | ICD-10-CM | POA: Diagnosis not present

## 2019-05-09 NOTE — Telephone Encounter (Signed)
Pt was called on home number with no answer, VM was left to return call

## 2019-05-13 ENCOUNTER — Telehealth: Payer: Self-pay | Admitting: Family Medicine

## 2019-05-13 NOTE — Telephone Encounter (Signed)
Patient's wife is wanting to know if we have received the cologaurd results for the patient. The patient has received the results ut does not understand the results and is hoping that Dr. Elease Hashimoto has received them to go over them with the patient.  Please Advise

## 2019-05-13 NOTE — Telephone Encounter (Signed)
I have not seen them

## 2019-05-14 NOTE — Telephone Encounter (Signed)
Called wife and informed her that we did not receive result. She stated on their end it looks like it stated pt had blood in his stool. Informed her that I would reach out to [employer] to obtain a copy then follow up with her. Called [employer] they will fax most recent cologuard to office.

## 2019-05-19 ENCOUNTER — Encounter: Payer: Self-pay | Admitting: Family Medicine

## 2019-05-19 NOTE — Telephone Encounter (Signed)
Dr. Elease Hashimoto, got fax of result. He noted that result was negative. Called and informed wife. She had no additional questions.

## 2019-05-23 ENCOUNTER — Other Ambulatory Visit: Payer: Self-pay | Admitting: Internal Medicine

## 2019-05-23 DIAGNOSIS — I10 Essential (primary) hypertension: Secondary | ICD-10-CM

## 2019-05-25 ENCOUNTER — Other Ambulatory Visit: Payer: Self-pay | Admitting: Internal Medicine

## 2019-05-30 ENCOUNTER — Encounter: Payer: Medicare HMO | Admitting: Family Medicine

## 2019-06-02 ENCOUNTER — Ambulatory Visit (INDEPENDENT_AMBULATORY_CARE_PROVIDER_SITE_OTHER): Payer: Medicare HMO

## 2019-06-02 DIAGNOSIS — Z95810 Presence of automatic (implantable) cardiac defibrillator: Secondary | ICD-10-CM

## 2019-06-02 DIAGNOSIS — I5042 Chronic combined systolic (congestive) and diastolic (congestive) heart failure: Secondary | ICD-10-CM

## 2019-06-02 NOTE — Progress Notes (Signed)
EPIC Encounter for ICM Monitoring  Patient Name: Bradley [name] is a 63 y.o. male Date: 06/02/2019 Primary Care Physican: [name], MD Heart Failure Clinic:[facility] Electrophysiologist: Caryl Comes Nephrologist:Deterding 04/08/2019 [id number]  Transmission reviewed.   CorVue thoracic impedance normal.  Prescribed:Furosemide 20 mg take 1 tablet 4 times a week.   Labs: 04/04/2019 Creatinine 1.01, BUN 18, Potassium 4.1, Sodium 142, GFR 79-92  Recommendations: None.  Follow-up plan: ICM clinic phone appointment on 07/07/2019.   91 day device clinic remote transmission 06/12/2019.      Copy of ICM check sent to Dr. Caryl Comes.   3 month ICM trend: 06/02/2019    1 Year ICM trend:       [name], RN 06/02/2019 10:08 AM

## 2019-06-07 ENCOUNTER — Other Ambulatory Visit: Payer: Self-pay | Admitting: Family Medicine

## 2019-06-09 DIAGNOSIS — I5042 Chronic combined systolic (congestive) and diastolic (congestive) heart failure: Secondary | ICD-10-CM | POA: Diagnosis not present

## 2019-06-11 ENCOUNTER — Encounter: Payer: Self-pay | Admitting: Family Medicine

## 2019-06-11 ENCOUNTER — Other Ambulatory Visit: Payer: Self-pay

## 2019-06-11 ENCOUNTER — Ambulatory Visit (INDEPENDENT_AMBULATORY_CARE_PROVIDER_SITE_OTHER): Payer: Medicare HMO | Admitting: Family Medicine

## 2019-06-11 VITALS — BP 110/70 | HR 53 | Temp 97.8°F | Ht 73.0 in | Wt 177.7 lb

## 2019-06-11 DIAGNOSIS — Z0000 Encounter for general adult medical examination without abnormal findings: Secondary | ICD-10-CM

## 2019-06-11 LAB — PSA: PSA: 0.79 ng/mL (ref 0.10–4.00)

## 2019-06-11 LAB — CBC WITH DIFFERENTIAL/PLATELET
Basophils Absolute: 0.1 10*3/uL (ref 0.0–0.1)
Basophils Relative: 1.2 % (ref 0.0–3.0)
Eosinophils Absolute: 0.2 10*3/uL (ref 0.0–0.7)
Eosinophils Relative: 3.1 % (ref 0.0–5.0)
HCT: 42.6 % (ref 39.0–52.0)
Hemoglobin: 14 g/dL (ref 13.0–17.0)
Lymphocytes Relative: 15.4 % (ref 12.0–46.0)
Lymphs Abs: 1 10*3/uL (ref 0.7–4.0)
MCHC: 33 g/dL (ref 30.0–36.0)
MCV: 95.9 fl (ref 78.0–100.0)
Monocytes Absolute: 0.7 10*3/uL (ref 0.1–1.0)
Monocytes Relative: 10.8 % (ref 3.0–12.0)
Neutro Abs: 4.3 10*3/uL (ref 1.4–7.7)
Neutrophils Relative %: 69.5 % (ref 43.0–77.0)
Platelets: 112 10*3/uL — ABNORMAL LOW (ref 150.0–400.0)
RBC: 4.44 Mil/uL (ref 4.22–5.81)
RDW: 14.1 % (ref 11.5–15.5)
WBC: 6.2 10*3/uL (ref 4.0–10.5)

## 2019-06-11 LAB — LIPID PANEL
Cholesterol: 132 mg/dL (ref 0–200)
HDL: 49.8 mg/dL (ref >39.00)
LDL Cholesterol: 67 mg/dL (ref 0–99)
NonHDL: 82.05
Total CHOL/HDL Ratio: 3
Triglycerides: 77 mg/dL (ref 0.0–149.0)
VLDL: 15.4 mg/dL (ref 0.0–40.0)

## 2019-06-11 LAB — BASIC METABOLIC PANEL
BUN: 24 mg/dL — ABNORMAL HIGH (ref 6–23)
CO2: 32 mEq/L (ref 19–32)
Calcium: 9.5 mg/dL (ref 8.4–10.5)
Chloride: 105 mEq/L (ref 96–112)
Creatinine, Ser: 1.12 mg/dL (ref 0.40–1.50)
GFR: 66.36 mL/min (ref >60.00)
Glucose, Bld: 118 mg/dL — ABNORMAL HIGH (ref 70–99)
Potassium: 4.7 mEq/L (ref 3.5–5.1)
Sodium: 142 mEq/L (ref 135–145)

## 2019-06-11 LAB — TSH: TSH: 1.75 u[IU]/mL (ref 0.35–4.50)

## 2019-06-11 LAB — HEPATIC FUNCTION PANEL
ALT: 18 U/L (ref 0–53)
AST: 19 U/L (ref 0–37)
Albumin: 4 g/dL (ref 3.5–5.2)
Alkaline Phosphatase: 48 U/L (ref 39–117)
Bilirubin, Direct: 0.1 mg/dL (ref 0.0–0.3)
Total Bilirubin: 0.4 mg/dL (ref 0.2–1.2)
Total Protein: 6.2 g/dL (ref 6.0–8.3)

## 2019-06-11 NOTE — Patient Instructions (Signed)
Consider shingles vaccine (Shingrix) and check with pharmacy if interested  We should consider repeat Cologuard. We will order this.

## 2019-06-11 NOTE — Progress Notes (Signed)
Subjective:     Patient ID: Bradley [name], male   DOB: 1956-01-11, 63 y.o.   MRN: [id number]  HPI Bradley [name] is seen for physical exam.  He has multiple chronic problems including history of nonischemic cardiomyopathy with combined systolic and diastolic heart failure, hypertension, hypothyroidism, chronic kidney disease, history of recurrent depression, history of aortic valve repair 1968, chronic anxiety symptoms.  He is followed regularly by cardiology.  Had recent digoxin level and electrolytes which were stable.  He is on multiple medications and these are reviewed.  He feels his depression is stable.  He did score 12 today on PHQ-9.  He feels his depression is unchanged.  Health maintenance reviewed  -Flu vaccine already given -Previous hepatitis C screen negative -Tetanus 2017 -No history of prior colonoscopy.  He had recent fit test and reportedly negative.  He states he had Cologuard 2016 -No history of shingles vaccine  Past Medical History:  Diagnosis Date  . Anxiety   . Aortic stenosis    s/p Bentall with bioprosthetic AVR 02/2010; Last echo (9/11): Moderate LVH, EF 45-50%, AVR functioning appropriately, aortic valve mean gradient 21, diastolic dysfunction. Chest MRA (2/13): Mild to moderate dilatation at the sinus of Valsalva at 4.1 cm, mild dilatation ascending aorta distal to the tube graft at 3.9 cm, moderate dilatation of the innominate artery a 2.1 cm;    . Depression   . ESRD on dialysis [facility]    a. 09/2013 felt to be related to gentamycin b. no longer requiring HD  . Hx of cardiac cath    a. LHC in 02/2010: normal cors  . Hx of echocardiogram 02/2014   Echo (8/15):  Mod LV, EF 35-40%, Gr 1 DD, AVR ok (mean 14 mmHg), mild LAE  . Hypertension   . Hypothyroidism, postsurgical   . Prosthetic valve endocarditis ([place])   . Staphylococcus aureus bacteremia   . Thyroid cancer ([place])   . Ventricular tachycardia ([place])    a. s/p STJ ICD   Past Surgical History:  Procedure  Laterality Date  . AORTIC VALVE REPAIR  1968  . AORTIC VALVE REPLACEMENT  2011  . AV FISTULA PLACEMENT Left 10/08/2013   Procedure: ARTERIOVENOUS (AV) FISTULA CREATION- LEFT ARM; Radial Cephalic ;  Surgeon: Bradley [name], MD;  Location: [place];  Service: Vascular;  Laterality: Left;  . BENTALL PROCEDURE N/A 10/28/2013   Procedure: REDO BENTALL PROCEDURE, debridment of aoritc root abscess, replacement of aortic root, ascending aorta and aortic valve with homograft. Insertion of left femoral arterial line;  Surgeon: Bradley Isaac, MD;  Location: [place];  Service: Open Heart Surgery;  Laterality: N/A;  . CARDIAC CATHETERIZATION  03/02/2010   NORMAL CORONARY ARTERY  . CARDIAC CATHETERIZATION N/A 01/01/2015   Procedure: Coronary/Graft Angiography;  Surgeon: Bradley Sine, MD;  Location: [place] CV LAB;  Service: Cardiovascular;  Laterality: N/A;  . CARDIAC VALVE REPLACEMENT    . EP IMPLANTABLE DEVICE N/A 01/01/2015   Procedure: ICD Implant;  Surgeon: Bradley Sprang, MD;  Location: [place] CV LAB;  Service: Cardiovascular;  Laterality: N/A;  . INTRAOPERATIVE TRANSESOPHAGEAL ECHOCARDIOGRAM N/A 10/28/2013   Procedure: INTRAOPERATIVE TRANSESOPHAGEAL ECHOCARDIOGRAM;  Surgeon: Bradley Isaac, MD;  Location: [place];  Service: Open Heart Surgery;  Laterality: N/A;  . PERIPHERAL VASCULAR CATHETERIZATION N/A 01/01/2015   Procedure: Aortic Arch Angiography;  Surgeon: Bradley Sine, MD;  Location: [place] CV LAB;  Service: Cardiovascular;  Laterality: N/A;  . SHOULDER ARTHROSCOPY W/ ROTATOR CUFF REPAIR  Right 2012  . STERNOTOMY     REDO  . TEE WITHOUT CARDIOVERSION N/A 10/24/2013   Procedure: TRANSESOPHAGEAL ECHOCARDIOGRAM (TEE);  Surgeon: Bradley Spark, MD;  Location: [facility] ENDOSCOPY;  Service: Cardiovascular;  Laterality: N/A;  . THYROIDECTOMY  ~ 2005  . TRANSTHORACIC ECHOCARDIOGRAM  03/2010   SHOWED MILD REDUCTION OF LV FUNCTION    reports that he has never smoked. He has never used smokeless  tobacco. He reports that he does not drink alcohol or use drugs. family history includes Heart disease in his father; Hypertension in his father and mother; Kidney Stones in his sister; Stroke in his father; Thyroid cancer in his sister and sister. Allergies  Allergen Reactions  . Oxycodone Other (See Comments)    Gives patient nightmares  . Rifampin Nausea Only     Review of Systems  Constitutional: Negative for fatigue and unexpected weight change.  Eyes: Negative for visual disturbance.  Respiratory: Negative for cough, chest tightness and shortness of breath.   Cardiovascular: Negative for chest pain, palpitations and leg swelling.  Gastrointestinal: Negative for abdominal pain.  Endocrine: Negative for polydipsia and polyuria.  Genitourinary: Negative for dysuria.  Neurological: Negative for dizziness, syncope, weakness, light-headedness and headaches.       Objective:   Physical Exam Constitutional:      Appearance: He is well-developed.  HENT:     Right Ear: External ear normal.     Left Ear: External ear normal.  Eyes:     Pupils: Pupils are equal, round, and reactive to light.  Neck:     Musculoskeletal: Neck supple.     Thyroid: No thyromegaly.  Cardiovascular:     Rate and Rhythm: Normal rate and regular rhythm.  Pulmonary:     Effort: Pulmonary effort is normal. No respiratory distress.     Breath sounds: Normal breath sounds. No wheezing or rales.  Abdominal:     Palpations: Abdomen is soft. There is no mass.     Tenderness: There is no abdominal tenderness.  Musculoskeletal:     Right lower leg: No edema.     Left lower leg: No edema.  Neurological:     Mental Status: He is alert and oriented to person, place, and time.        Assessment:     Physical exam.  He has multiple chronic problems as above which appear to be stable currently.  We discussed several health maintenance issues as follows    Plan:     -Recommend repeat Cologuard and this  will be ordered -We discussed Shingrix vaccine and he will check on insurance coverage -Obtain follow-up labs -The natural history of prostate cancer and ongoing controversy regarding screening and potential treatment outcomes of prostate cancer has been discussed with the patient. The meaning of a false positive PSA and a false negative PSA has been discussed. He indicates understanding of the limitations of this screening test and wishes  to proceed with screening PSA testing.  [name] MD [facility]

## 2019-06-12 ENCOUNTER — Encounter: Payer: Medicare HMO | Admitting: *Deleted

## 2019-06-16 ENCOUNTER — Telehealth: Payer: Self-pay

## 2019-06-16 NOTE — Telephone Encounter (Signed)
Left message for patient to remind of missed remote transmission.  

## 2019-06-30 DIAGNOSIS — Z1211 Encounter for screening for malignant neoplasm of colon: Secondary | ICD-10-CM | POA: Diagnosis not present

## 2019-07-07 ENCOUNTER — Ambulatory Visit (INDEPENDENT_AMBULATORY_CARE_PROVIDER_SITE_OTHER): Payer: Medicare HMO

## 2019-07-07 ENCOUNTER — Telehealth: Payer: Self-pay

## 2019-07-07 DIAGNOSIS — Z95810 Presence of automatic (implantable) cardiac defibrillator: Secondary | ICD-10-CM | POA: Diagnosis not present

## 2019-07-07 DIAGNOSIS — I5042 Chronic combined systolic (congestive) and diastolic (congestive) heart failure: Secondary | ICD-10-CM

## 2019-07-07 NOTE — Telephone Encounter (Signed)
Copied from [place] [id number]. Topic: General - Inquiry >> Jul 07, 2019  3:21 PM Bradley [name] wrote: Reason for CRM: Pt's cologuard results came back positive. Please advise. Results are being faxed as well.

## 2019-07-07 NOTE — Telephone Encounter (Signed)
He will need GI referral if Cologuard positive so we should go ahead and set that up.

## 2019-07-07 NOTE — Telephone Encounter (Signed)
Please see message. Do you want to set up Doxy or in office visit?

## 2019-07-08 ENCOUNTER — Encounter: Payer: Self-pay | Admitting: Gastroenterology

## 2019-07-08 ENCOUNTER — Telehealth: Payer: Self-pay

## 2019-07-08 ENCOUNTER — Other Ambulatory Visit: Payer: Self-pay

## 2019-07-08 DIAGNOSIS — I5042 Chronic combined systolic (congestive) and diastolic (congestive) heart failure: Secondary | ICD-10-CM | POA: Diagnosis not present

## 2019-07-08 DIAGNOSIS — R195 Other fecal abnormalities: Secondary | ICD-10-CM

## 2019-07-08 NOTE — Telephone Encounter (Signed)
Left message for patient to remind of missed remote transmission.  

## 2019-07-08 NOTE — Telephone Encounter (Signed)
Referral has been placed.   Called patient and spoke to his wife and let her know that the referral has been placed and they will receive a call hopefully this week for an appointment with GI. Wife verbalized an understanding.

## 2019-07-09 ENCOUNTER — Encounter (HOSPITAL_COMMUNITY): Payer: Self-pay | Admitting: Internal Medicine

## 2019-07-09 ENCOUNTER — Other Ambulatory Visit: Payer: Self-pay

## 2019-07-09 ENCOUNTER — Ambulatory Visit (HOSPITAL_BASED_OUTPATIENT_CLINIC_OR_DEPARTMENT_OTHER)
Admission: RE | Admit: 2019-07-09 | Discharge: 2019-07-09 | Disposition: A | Discharge status: Home / Self Care | Payer: Medicare HMO | Source: Ambulatory Visit | Attending: Internal Medicine | Admitting: Internal Medicine

## 2019-07-09 ENCOUNTER — Ambulatory Visit (HOSPITAL_COMMUNITY)
Admission: RE | Admit: 2019-07-09 | Discharge: 2019-07-09 | Disposition: A | Discharge status: Home / Self Care | Payer: Medicare HMO | Source: Ambulatory Visit | Attending: Internal Medicine | Admitting: Internal Medicine

## 2019-07-09 VITALS — BP 113/60 | HR 52 | Wt 183.6 lb

## 2019-07-09 DIAGNOSIS — I132 Hypertensive heart and chronic kidney disease with heart failure and with stage 5 chronic kidney disease, or end stage renal disease: Secondary | ICD-10-CM | POA: Insufficient documentation

## 2019-07-09 DIAGNOSIS — Z992 Dependence on renal dialysis: Secondary | ICD-10-CM | POA: Insufficient documentation

## 2019-07-09 DIAGNOSIS — Z8249 Family history of ischemic heart disease and other diseases of the circulatory system: Secondary | ICD-10-CM | POA: Diagnosis not present

## 2019-07-09 DIAGNOSIS — I5042 Chronic combined systolic (congestive) and diastolic (congestive) heart failure: Secondary | ICD-10-CM

## 2019-07-09 DIAGNOSIS — I359 Nonrheumatic aortic valve disorder, unspecified: Secondary | ICD-10-CM

## 2019-07-09 DIAGNOSIS — I472 Ventricular tachycardia, unspecified: Secondary | ICD-10-CM

## 2019-07-09 DIAGNOSIS — Z7982 Long term (current) use of aspirin: Secondary | ICD-10-CM | POA: Diagnosis not present

## 2019-07-09 DIAGNOSIS — Z7901 Long term (current) use of anticoagulants: Secondary | ICD-10-CM | POA: Diagnosis not present

## 2019-07-09 DIAGNOSIS — I5022 Chronic systolic (congestive) heart failure: Secondary | ICD-10-CM

## 2019-07-09 DIAGNOSIS — Z953 Presence of xenogenic heart valve: Secondary | ICD-10-CM | POA: Diagnosis not present

## 2019-07-09 DIAGNOSIS — Z79899 Other long term (current) drug therapy: Secondary | ICD-10-CM | POA: Diagnosis not present

## 2019-07-09 DIAGNOSIS — E890 Postprocedural hypothyroidism: Secondary | ICD-10-CM | POA: Insufficient documentation

## 2019-07-09 DIAGNOSIS — I428 Other cardiomyopathies: Secondary | ICD-10-CM | POA: Diagnosis not present

## 2019-07-09 DIAGNOSIS — F419 Anxiety disorder, unspecified: Secondary | ICD-10-CM | POA: Diagnosis not present

## 2019-07-09 DIAGNOSIS — F329 Major depressive disorder, single episode, unspecified: Secondary | ICD-10-CM | POA: Diagnosis not present

## 2019-07-09 DIAGNOSIS — I082 Rheumatic disorders of both aortic and tricuspid valves: Secondary | ICD-10-CM | POA: Insufficient documentation

## 2019-07-09 DIAGNOSIS — N186 End stage renal disease: Secondary | ICD-10-CM | POA: Diagnosis not present

## 2019-07-09 DIAGNOSIS — Z01810 Encounter for preprocedural cardiovascular examination: Secondary | ICD-10-CM | POA: Diagnosis not present

## 2019-07-09 MED ORDER — FARXIGA 10 MG PO TABS: 10.0000 mg | ORAL_TABLET | Freq: Every day | ORAL | 3 refills | Status: DC

## 2019-07-09 NOTE — Patient Instructions (Signed)
Follow up in 6 months   START Farxiga 10mg  daily

## 2019-07-09 NOTE — Progress Notes (Signed)
Echocardiogram 2D Echocardiogram has been performed.  Oneal Deputy Cozy Veale 07/09/2019, 1:42 PM

## 2019-07-09 NOTE — Progress Notes (Signed)
Heart Failure Clinic Note  Date:  07/09/2019   ID:  Bradley [name], DOB [birth year], MRN [id number]  Location: Home  Provider location:  Advanced Heart Failure Clinic Type of Visit: Established patient  PCP:  [name], MD  Cardiologist:  Dorris Carnes, MD Primary HF: Bradley [name]  Chief Complaint: Heart Failure follow-up   History of Present Illness:  Bradley [name] is a 63 y/o male with a complicated past medical history including thyroid cancer (s/p thyroidectomy), hypertension, depression, and congenital aortic stenosis for which he underwent aortic vavulotomy at age 86 with subsequent Bentall procedure in 2001 with a pericardial tissue valve. He had MSSA bacteremia and bacterial endocarditis in 09/8411 complicated by renal failure requiring dialysis for a short period of time. 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   He was admitted 11/2014 with progressive episodes of palpitations and pre-syncope. He was found to have monomorphic ventricular tachycardia. Catheterization demonstrated normal coronaries. Echocardiogram demonstrated EF 30-35% and he underwent STJ dual chamber ICD implant.  He presents for routine f/u today. Doing very well. Very active. Working in his shop and Community education officer. Denies SOB, orthopnea or PND. No CP or edema.    Failed Cologard. Pending colonoscopy.  Echo today EF 25% RV ok Personally reviewed .   Studies:  Echo 7/19  30-35% AVR stable. Personally reviewed Echo 5/18 EF 25-30%    CPX 01/01/17  FEV1 3.40 (86%)     FEV1/FVC 83 (107%)     MVV 163 (108%)  Resting HR: 72 Peak HR: 129  (80% age predicted max HR) BP rest: 118/70 BP peak: 174/84  Peak VO2: 21.1 (73% predicted peak VO2) VE/VCO2 slope: 35 OUES: 1.96 Peak RER: 1.22 Ventilatory Threshold: 16.6 (58% predicted or  measured peak VO2) VE/MVV: 50% O2pulse: 15  (100% predicted O2pulse)    Past Medical History:  Diagnosis Date  . Anxiety   . Aortic stenosis    s/p Bentall with bioprosthetic AVR 02/2010; Last echo (9/11): Moderate LVH, EF 45-50%, AVR functioning appropriately, aortic valve mean gradient 21, diastolic dysfunction. Chest MRA (2/13): Mild to moderate dilatation at the sinus of Valsalva at 4.1 cm, mild dilatation ascending aorta distal to the tube graft at 3.9 cm, moderate dilatation of the innominate artery a 2.1 cm;    . Depression   . ESRD on dialysis [facility])    a. 09/2013 felt to be related to gentamycin b. no longer requiring HD  . Hx of cardiac cath    a. LHC in 02/2010: normal cors  . Hx of echocardiogram 02/2014   Echo (8/15):  Mod LV, EF 35-40%, Gr 1 DD, AVR ok (mean 14 mmHg), mild LAE  . Hypertension   . Hypothyroidism, postsurgical   . Prosthetic valve endocarditis ([place])   . Staphylococcus aureus bacteremia   . Thyroid cancer ([place])   . Ventricular tachycardia ([place])    a. s/p STJ ICD   Past Surgical History:  Procedure Laterality Date  . AORTIC VALVE REPAIR  1968  . AORTIC VALVE REPLACEMENT  2011  . AV FISTULA PLACEMENT Left 10/08/2013   Procedure: ARTERIOVENOUS (AV) FISTULA CREATION- LEFT ARM; Radial Cephalic ;  Surgeon: Mal [name], MD;  Location: [place];  Service: Vascular;  Laterality: Left;  . BENTALL PROCEDURE N/A 10/28/2013   Procedure: REDO BENTALL PROCEDURE, debridment of aoritc root abscess, replacement of aortic root, ascending  aorta and aortic valve with homograft. Insertion of left femoral arterial line;  Surgeon: [name], MD;  Location: [place];  Service: Open Heart Surgery;  Laterality: N/A;  . CARDIAC CATHETERIZATION  03/02/2010   NORMAL CORONARY ARTERY  . CARDIAC CATHETERIZATION N/A 01/01/2015   Procedure: Coronary/Graft Angiography;  Surgeon: Troy Sine, MD;  Location: [place] CV LAB;  Service: Cardiovascular;  Laterality: N/A;  . CARDIAC VALVE  REPLACEMENT    . EP IMPLANTABLE DEVICE N/A 01/01/2015   Procedure: ICD Implant;  Surgeon: Deboraha Sprang, MD;  Location: [place] CV LAB;  Service: Cardiovascular;  Laterality: N/A;  . INTRAOPERATIVE TRANSESOPHAGEAL ECHOCARDIOGRAM N/A 10/28/2013   Procedure: INTRAOPERATIVE TRANSESOPHAGEAL ECHOCARDIOGRAM;  Surgeon: [name], MD;  Location: [place];  Service: Open Heart Surgery;  Laterality: N/A;  . PERIPHERAL VASCULAR CATHETERIZATION N/A 01/01/2015   Procedure: Aortic Arch Angiography;  Surgeon: Troy Sine, MD;  Location: [place] CV LAB;  Service: Cardiovascular;  Laterality: N/A;  . SHOULDER ARTHROSCOPY W/ ROTATOR CUFF REPAIR Right 2012  . STERNOTOMY     REDO  . TEE WITHOUT CARDIOVERSION N/A 10/24/2013   Procedure: TRANSESOPHAGEAL ECHOCARDIOGRAM (TEE);  Surgeon: Dorothy Spark, MD;  [facility]: [facility] ENDOSCOPY;  Service: Cardiovascular;  Laterality: N/A;  . THYROIDECTOMY  ~ 2005  . TRANSTHORACIC ECHOCARDIOGRAM  03/2010   SHOWED MILD REDUCTION OF LV FUNCTION     Current Outpatient Medications  Medication Sig Dispense Refill  . aspirin EC 81 MG tablet Take 81 mg by mouth daily.     Marland Kitchen atorvastatin (LIPITOR) 40 MG tablet TAKE 1 TABLET BY MOUTH EVERY DAY 90 tablet 0  . carvedilol (COREG) 6.25 MG tablet Take 1 tablet (6.25 mg total) by mouth 2 (two) times daily with a meal. 180 tablet 3  . clonazePAM (KLONOPIN) 1 MG tablet TAKE 1 TABLET BY MOUTH TWICE A DAY AS NEEDED *NOT FOR REGULAR USE* 30 tablet 0  . digoxin (LANOXIN) 0.125 MG tablet Take 1 tablet (125 mcg total) by mouth daily. 90 tablet 3  . docusate sodium (STOOL SOFTENER) 100 MG capsule Take 100 mg by mouth 2 (two) times daily.    . Ensure (ENSURE) Take 237 mLs by mouth daily as needed. 237 mL 12  . ENTRESTO 97-103 MG TAKE 1 TABLET BY MOUTH TWICE A DAY 180 tablet 1  . furosemide (LASIX) 20 MG tablet TAKE 1 TABLET (20 MG TOTAL) BY MOUTH 4 (FOUR) TIMES A WEEK. 45 tablet 3  . levothyroxine (SYNTHROID) 175 MCG tablet TAKE 1 TABLET  (175 MCG TOTAL) BY MOUTH DAILY BEFORE BREAKFAST. 90 tablet 3  . mexiletine (MEXITIL) 150 MG capsule TAKE 2 CAPSULES BY MOUTH EVERY MORNING AND 2 CAPSULES BY MOUTH EVERY EVENING. *NEED OFFICE VISIT* 360 capsule 0  . ranolazine (RANEXA) 500 MG 12 hr tablet Take 1 tablet (500 mg total) by mouth 2 (two) times daily. 60 tablet 10  . sertraline (ZOLOFT) 50 MG tablet Take 1 tablet (50 mg total) by mouth daily. 90 tablet 3   No current facility-administered medications for this encounter.     Allergies:   Oxycodone and Rifampin   Social History:  The patient  reports that he has never smoked. He has never used smokeless tobacco. He reports that he does not drink alcohol or use drugs.   Family History:  The patient's family history includes Heart disease in his father; Hypertension in his father and mother; Kidney Stones in his sister; Stroke in his father; Thyroid cancer in his  sister and sister.   ROS:  Please see the history of present illness.   All other systems are personally reviewed and negative.   Vitals:   07/09/19 1349  BP: 113/60  Pulse: (!) 52  SpO2: 99%  Weight: 83.3 kg (183 lb 9.6 oz)    Exam:  General:  Well appearing. No resp difficulty HEENT: normal Neck: supple. no JVD. Carotids 2+ bilat; no bruits. No lymphadenopathy or thryomegaly appreciated. Cor: PMI nondisplaced. Regular rate & rhythm. No rubs, gallops or murmurs. Lungs: clear Abdomen: soft, nontender, nondistended. No hepatosplenomegaly. No bruits or masses. Good bowel sounds. Extremities: no cyanosis, clubbing, rash, edema Neuro: alert & orientedx3, cranial nerves grossly intact. moves all 4 extremities w/o difficulty. Affect pleasant  Recent Labs: 06/11/2019: ALT 18; BUN 24; Creatinine, Ser 1.12; Hemoglobin 14.0; Platelets 112.0; Potassium 4.7; Sodium 142; TSH 1.75  Personally reviewed   Wt Readings from Last 3 Encounters:  07/09/19 83.3 kg (183 lb 9.6 oz)  06/11/19 80.6 kg (177 lb 11.2 oz)  04/08/19 83 kg  (183 lb)      ASSESSMENT AND PLAN:  1. Chronic systolic HF  - NICM St Jude  Echo 5/17 LVEF 25-30%, Mild MR, Mod LAE, RV mildly dilated, Mild RAE. ECHO 12/21/2016 LVEF 25-30%. IW AK RV mild-mod  - Echo 02/20/18 shows 30-35% AVR stable.  - Echo today EF 25% Personally reviewed - Very stable NYHA II - Volume status stable. Continue lasix Mon & Thursday. May need to cut back with Farxiga  - March 2017 CPX with moderate HF limitation.  - CPX 6/18 much improved. Peak VO2: 21.1 (73% predicted peak VO2) VE/VCO2 slope: 35 - Continue Entresto 97/103 mg BID.  - Continue coreg 6.25 mg BID. Unable to titrate due to low HR - Continue digoxin 0.125 mg daily.  - Will add Farxiga 10  2. VT/NICM - Quiescent on mexilitene/Ranexa - ICD interrogated in clinic personally. No VT. Fluid looks good. Activity level 2-4hr/day.  3. HTN - Blood pressure well controlled. Continue current regimen. 4. Valvular heart disease - s/p bentall in 2001 and re-do bentall 2015. Stable on recent echo  - Reinforced need for SBE 5. Depression - Stable.  6. Pre-op CV examination - he is low to moderate risk for peri-op cardiac complications with colonscopy. Can proceed.  Treasa School, MD  07/09/2019 2:20 PM  Advanced Heart Failure [place] [address] Heart and [place] [address] [phone] (office) [phone] (fax)'

## 2019-07-11 ENCOUNTER — Other Ambulatory Visit: Payer: Self-pay

## 2019-07-11 ENCOUNTER — Ambulatory Visit (AMBULATORY_SURGERY_CENTER): Payer: Medicare HMO | Admitting: *Deleted

## 2019-07-11 VITALS — Temp 97.2°F | Ht 73.0 in | Wt 181.0 lb

## 2019-07-11 DIAGNOSIS — Z1159 Encounter for screening for other viral diseases: Secondary | ICD-10-CM

## 2019-07-11 DIAGNOSIS — R195 Other fecal abnormalities: Secondary | ICD-10-CM

## 2019-07-11 MED ORDER — NA SULFATE-K SULFATE-MG SULF 17.5-3.13-1.6 GM/177ML PO SOLN: 1.0000 | Freq: Once | ORAL | 0 refills | Status: AC

## 2019-07-11 NOTE — Progress Notes (Signed)
EPIC Encounter for ICM Monitoring  Patient Name: Bradley [name] is a 63 y.o. male Date: 07/11/2019 Primary Care Physican: [name], MD Heart Failure Clinic:[facility] Electrophysiologist: Caryl Comes Nephrologist:[place] 07/09/2019 [id number]  Transmission reviewed. Patient had Merlin report reviewed at the office visit with Dr Haroldine Laws 07/09/2019.  CorVue thoracic impedance normal.  Prescribed:Furosemide 20 mg take 1 tablet 4 times a week.   Labs: 06/11/2019 Creatinine 1.12, BUN 24, Potassium 4.7, Sodium 142, GFR 66.36 04/04/2019 Creatinine 1.01, BUN 18, Potassium 4.1, Sodium 142, GFR 79-92  Recommendations: None.  Follow-up plan: ICM clinic phone appointment on 08/11/2019.   91 day device clinic remote transmission 09/11/2019.      Copy of ICM check sent to Dr. Caryl Comes.   3 month ICM trend: 07/09/2019    1 Year ICM trend:       [name], RN 07/11/2019 9:01 AM

## 2019-07-11 NOTE — Progress Notes (Signed)

## 2019-07-13 ENCOUNTER — Other Ambulatory Visit: Payer: Self-pay | Admitting: Family Medicine

## 2019-07-13 ENCOUNTER — Other Ambulatory Visit (HOSPITAL_COMMUNITY): Payer: Self-pay | Admitting: Cardiology

## 2019-07-14 ENCOUNTER — Telehealth: Payer: Self-pay | Admitting: *Deleted

## 2019-07-14 NOTE — Telephone Encounter (Signed)
This patient was referred to [place] for + cologuard, he had an echo on 07/09/2019 EF 30-35%, would you like the patient to have OV with you or direct hospital colonoscopy okay? Please advise. Thank you, Nyanna Heideman pv

## 2019-07-15 NOTE — Telephone Encounter (Signed)
Left message for the patient to call back to rescheduled colon at [facility] and reschedule [facility] COVID-19 screen.

## 2019-07-15 NOTE — Telephone Encounter (Signed)
Thanks for checking with me.  I reviewed the most recent Cardiology office note by Dr. Haroldine Laws, and the EF that day was 25%.  However, patient not on oral anti-coagulation and reportedly good functional status.  Dr. Haroldine Laws feels patient acceptable risk for colonoscopy.  All that considered, this patient can be directly booked for a colonoscopy with me in my next available [facility] outpatient block. Due to some cancellations,  I have openings next Tuesday, December 22nd if patient would like to go that soon, and if arrangements with prep and COVID testing could be arranged.   Next [facility] date after that is in late January, though we already have a waiting list for that date, and scheduling would need to be coordinated with my clinic nurse.  Please contact patient today, see about the date next week, and talk with my nurse Brittani.  - HD

## 2019-07-16 ENCOUNTER — Other Ambulatory Visit: Payer: Self-pay | Admitting: *Deleted

## 2019-07-16 DIAGNOSIS — R195 Other fecal abnormalities: Secondary | ICD-10-CM

## 2019-07-16 NOTE — Telephone Encounter (Signed)
Spoke to the patient and his wife. The patient has now been scheduled for the following:   07/18/19 at 11:10 am COVID-19 screening at [facility]  07/22/19 at 12:30 pm colonoscopy at [place] with patient and his wife. Informed them of new COVID screening location at [facility]. Gave them the address. Changed in Epic. This RN also discussed the new times for second part of prep. Verbalized understanding. Asked Mrs. Som if she wanted new prep instructions sent to MyChart. She declined stating she did not know how to access MyChart. This RN also asked Mrs. Mia if she wanted a copy of new prep instructions placed at the front desk for pick up. She declined stating she understood the newly reviewed prep instructions.

## 2019-07-18 ENCOUNTER — Other Ambulatory Visit (HOSPITAL_COMMUNITY)
Admission: RE | Admit: 2019-07-18 | Discharge: 2019-07-18 | Disposition: A | Discharge status: Home / Self Care | Payer: Medicare HMO | Source: Ambulatory Visit | Attending: Gastroenterology | Admitting: Gastroenterology

## 2019-07-18 DIAGNOSIS — Z20828 Contact with and (suspected) exposure to other viral communicable diseases: Secondary | ICD-10-CM | POA: Diagnosis not present

## 2019-07-18 DIAGNOSIS — Z01812 Encounter for preprocedural laboratory examination: Secondary | ICD-10-CM | POA: Diagnosis not present

## 2019-07-19 LAB — NOVEL CORONAVIRUS, NAA (HOSP ORDER, SEND-OUT TO REF LAB; TAT 18-24 HRS): SARS-CoV-2, NAA: NOT DETECTED

## 2019-07-22 ENCOUNTER — Ambulatory Visit (HOSPITAL_COMMUNITY): Payer: Medicare HMO | Admitting: Registered Nurse

## 2019-07-22 ENCOUNTER — Ambulatory Visit (HOSPITAL_COMMUNITY)
Admission: RE | Admit: 2019-07-22 | Discharge: 2019-07-22 | Disposition: A | Discharge status: Home / Self Care | Payer: Medicare HMO | Attending: Gastroenterology | Admitting: Gastroenterology

## 2019-07-22 ENCOUNTER — Other Ambulatory Visit: Payer: Self-pay

## 2019-07-22 ENCOUNTER — Encounter (HOSPITAL_COMMUNITY): Payer: Self-pay | Admitting: Gastroenterology

## 2019-07-22 ENCOUNTER — Encounter (HOSPITAL_COMMUNITY)
Admission: RE | Disposition: A | Discharge status: Home / Self Care | Payer: Self-pay | Source: Home / Self Care | Attending: Gastroenterology

## 2019-07-22 DIAGNOSIS — I5042 Chronic combined systolic (congestive) and diastolic (congestive) heart failure: Secondary | ICD-10-CM | POA: Diagnosis not present

## 2019-07-22 DIAGNOSIS — D123 Benign neoplasm of transverse colon: Secondary | ICD-10-CM | POA: Diagnosis not present

## 2019-07-22 DIAGNOSIS — Z992 Dependence on renal dialysis: Secondary | ICD-10-CM | POA: Insufficient documentation

## 2019-07-22 DIAGNOSIS — K635 Polyp of colon: Secondary | ICD-10-CM | POA: Diagnosis not present

## 2019-07-22 DIAGNOSIS — Z953 Presence of xenogenic heart valve: Secondary | ICD-10-CM | POA: Diagnosis not present

## 2019-07-22 DIAGNOSIS — R195 Other fecal abnormalities: Secondary | ICD-10-CM | POA: Diagnosis not present

## 2019-07-22 DIAGNOSIS — Z1211 Encounter for screening for malignant neoplasm of colon: Secondary | ICD-10-CM | POA: Diagnosis not present

## 2019-07-22 DIAGNOSIS — N186 End stage renal disease: Secondary | ICD-10-CM | POA: Insufficient documentation

## 2019-07-22 DIAGNOSIS — D122 Benign neoplasm of ascending colon: Secondary | ICD-10-CM | POA: Diagnosis not present

## 2019-07-22 DIAGNOSIS — I509 Heart failure, unspecified: Secondary | ICD-10-CM | POA: Insufficient documentation

## 2019-07-22 DIAGNOSIS — Z85850 Personal history of malignant neoplasm of thyroid: Secondary | ICD-10-CM | POA: Insufficient documentation

## 2019-07-22 DIAGNOSIS — I130 Hypertensive heart and chronic kidney disease with heart failure and stage 1 through stage 4 chronic kidney disease, or unspecified chronic kidney disease: Secondary | ICD-10-CM | POA: Diagnosis not present

## 2019-07-22 DIAGNOSIS — I132 Hypertensive heart and chronic kidney disease with heart failure and with stage 5 chronic kidney disease, or end stage renal disease: Secondary | ICD-10-CM | POA: Diagnosis not present

## 2019-07-22 DIAGNOSIS — N182 Chronic kidney disease, stage 2 (mild): Secondary | ICD-10-CM | POA: Diagnosis not present

## 2019-07-22 HISTORY — PX: HEMOSTASIS CLIP PLACEMENT: SHX6857

## 2019-07-22 HISTORY — PX: POLYPECTOMY: SHX5525

## 2019-07-22 HISTORY — PX: COLONOSCOPY WITH PROPOFOL: SHX5780

## 2019-07-22 HISTORY — PX: BIOPSY: SHX5522

## 2019-07-22 SURGERY — COLONOSCOPY WITH PROPOFOL
Anesthesia: Monitor Anesthesia Care

## 2019-07-22 MED ORDER — LACTATED RINGERS IV SOLN: INTRAVENOUS | Status: DC

## 2019-07-22 MED ORDER — PHENYLEPHRINE 40 MCG/ML (10ML) SYRINGE FOR IV PUSH (FOR BLOOD PRESSURE SUPPORT)
PREFILLED_SYRINGE | INTRAVENOUS | Status: DC | PRN
  Administered 2019-07-22 (×3): 80 ug via INTRAVENOUS

## 2019-07-22 MED ORDER — SODIUM CHLORIDE 0.9 % IV SOLN: INTRAVENOUS | Status: DC

## 2019-07-22 MED ORDER — PROPOFOL 10 MG/ML IV BOLUS
INTRAVENOUS | Status: DC | PRN
  Administered 2019-07-22 (×6): 20 mg via INTRAVENOUS
  Administered 2019-07-22: 30 mg via INTRAVENOUS
  Administered 2019-07-22 (×4): 20 mg via INTRAVENOUS
  Administered 2019-07-22: 30 mg via INTRAVENOUS
  Administered 2019-07-22 (×8): 20 mg via INTRAVENOUS

## 2019-07-22 MED ORDER — LIDOCAINE 2% (20 MG/ML) 5 ML SYRINGE
INTRAMUSCULAR | Status: DC | PRN
  Administered 2019-07-22: 60 mg via INTRAVENOUS

## 2019-07-22 MED ORDER — PROPOFOL 10 MG/ML IV BOLUS
INTRAVENOUS | Status: AC
  Filled 2019-07-22: qty 20

## 2019-07-22 SURGICAL SUPPLY — 22 items

## 2019-07-22 NOTE — Anesthesia Postprocedure Evaluation (Signed)
Anesthesia Post Note  Patient: Bradley [name]  Procedure(s) Performed: COLONOSCOPY WITH PROPOFOL (N/A ) BIOPSY POLYPECTOMY HEMOSTASIS CLIP PLACEMENT     Patient location during evaluation: Endoscopy Anesthesia Type: MAC Level of consciousness: awake and alert Pain management: pain level controlled Vital Signs Assessment: post-procedure vital signs reviewed and stable Respiratory status: spontaneous breathing, nonlabored ventilation, respiratory function stable and patient connected to nasal cannula oxygen Cardiovascular status: stable and blood pressure returned to baseline Postop Assessment: no apparent nausea or vomiting Anesthetic complications: no    Last Vitals:  Vitals:   07/22/19 1320 07/22/19 1330  BP: (!) 100/49 (!) 98/57  Pulse: (!) 53 (!) 54  Resp: 16 16  Temp:    SpO2: 98% 97%    Last Pain:  Vitals:   07/22/19 1310  TempSrc: Oral  PainSc: 0-No pain                 Kimori Tartaglia

## 2019-07-22 NOTE — Op Note (Signed)
[facility] Patient Name: Bradley [name] Procedure Date: 07/22/2019 MRN: [id number] Attending MD: Estill Cotta. Loletha Carrow , MD Date of Birth: 1956-01-04 CSN: [id number] Age: 63 Admit Type: Outpatient Procedure:                Colonoscopy Indications:              recent positive Cologuard test (negative cologuard                            in 2017) Providers:                Estill Cotta. Loletha Carrow, MD, Cleda Daub, RN, Marguerita Merles, Technician, Corie Chiquito, Technician, Marla Roe, CRNA Referring MD:             [name], MD Medicines:                Monitored Anesthesia Care Complications:            No immediate complications. Estimated Blood Loss:     Estimated blood loss was minimal. Procedure:                Pre-Anesthesia Assessment:                           - Prior to the procedure, a History and Physical                            was performed, and patient medications and                            allergies were reviewed. The patient's tolerance of                            previous anesthesia was also reviewed. The risks                            and benefits of the procedure and the sedation                            options and risks were discussed with the patient.                            All questions were answered, and informed consent                            was obtained. Prior Anticoagulants: The patient has                            taken no previous anticoagulant or antiplatelet                            agents. ASA  Grade Assessment: III - A patient with                            severe systemic disease. After reviewing the risks                            and benefits, the patient was deemed in                            satisfactory condition to undergo the procedure.                           After obtaining informed consent, the colonoscope                            was passed under  direct vision. Throughout the                            procedure, the patient's blood pressure, pulse, and                            oxygen saturations were monitored continuously. The                            CF-HQ190L ([id number]) Olympus colonoscope was                            introduced through the anus and advanced to the the                            terminal ileum, with identification of the                            appendiceal orifice and IC valve. The colonoscopy                            was performed without difficulty. The patient                            tolerated the procedure well. The quality of the                            bowel preparation was good. The ileocecal valve,                            appendiceal orifice, and rectum were photographed. Scope In: 12:22:51 PM Scope Out: 12:55:47 PM Scope Withdrawal Time: 0 hours 28 minutes 18 seconds  Total Procedure Duration: 0 hours 32 minutes 56 seconds  Findings:      The terminal ileum appeared normal.      A diminutive polyp was found in the distal ascending colon. The polyp       was flat. The polyp was removed with a cold biopsy forceps. Resection       and retrieval were complete. (Jar 1)  A 5 mm polyp was found in the mid transverse colon. The polyp was       sessile. The polyp was removed with a cold snare. Resection and       retrieval were complete. (Jar 2)      A 10 mm polyp was found in the distal transverse colon. The polyp was       sessile. The polyp was removed with a cold snare. Resection and       retrieval were complete. (Jar 2) To stop active post-polypectomy       bleeding, three hemostatic clips were successfully placed (MR       conditional). There was no bleeding at the end of the procedure.      The exam was otherwise without abnormality on direct and retroflexion       views. Impression:               - One diminutive polyp in the distal ascending                            colon,  removed with a cold biopsy forceps. Resected                            and retrieved.                           - One 5 mm polyp in the mid transverse colon,                            removed with a cold snare. Resected and retrieved.                           - One 10 mm polyp in the distal transverse colon,                            removed with a cold snare. Resected and retrieved.                            Clips (MR conditional) were placed.                           - The examination was otherwise normal on direct                            and retroflexion views. Moderate Sedation:      MAC sedation used Recommendation:           - Patient has a contact number available for                            emergencies. The signs and symptoms of potential                            delayed complications were discussed with the                            patient. Return to normal activities  tomorrow.                            Written discharge instructions were provided to the                            patient.                           - Resume previous diet.                           - Continue present medications.                           - Await pathology results.                           - Repeat colonoscopy is recommended for                            surveillance. The colonoscopy date will be                            determined after pathology results from today's                            exam become available for review. Procedure Code(s):        --- Professional ---                           [phone], Colonoscopy, flexible; with removal of                            tumor(s), polyp(s), or other lesion(s) by snare                            technique                           [id number], 51, Colonoscopy, flexible; with biopsy,                            single or multiple Diagnosis Code(s):        --- Professional ---                           K63.5, Polyp of colon                            R19.5, Other fecal abnormalities CPT copyright 2019 American Medical Association. All rights reserved. The codes documented in this report are preliminary and upon coder review may  be revised to meet current compliance requirements. Silvie Obremski L. Loletha Carrow, MD 07/22/2019 1:14:08 PM This report has been signed electronically. Number of Addenda: 0

## 2019-07-22 NOTE — Discharge Instructions (Signed)
YOU HAD AN ENDOSCOPIC PROCEDURE TODAY: Refer to the procedure report and other information in the discharge instructions given to you for any specific questions about what was found during the examination. If this information does not answer your questions, please call [place] office at [phone] to clarify.  ° °YOU SHOULD EXPECT: Some feelings of bloating in the abdomen. Passage of more gas than usual. Walking can help get rid of the air that was put into your GI tract during the procedure and reduce the bloating. If you had a lower endoscopy (such as a colonoscopy or flexible sigmoidoscopy) you may notice spotting of blood in your stool or on the toilet paper. Some abdominal soreness may be present for a day or two, also. ° °DIET: Your first meal following the procedure should be a light meal and then it is ok to progress to your normal diet. A half-sandwich or bowl of soup is an example of a good first meal. Heavy or fried foods are harder to digest and may make you feel nauseous or bloated. Drink plenty of fluids but you should avoid alcoholic beverages for 24 hours. If you had a esophageal dilation, please see attached instructions for diet.   ° °ACTIVITY: Your care partner should take you home directly after the procedure. You should plan to take it easy, moving slowly for the rest of the day. You can resume normal activity the day after the procedure however YOU SHOULD NOT DRIVE, use power tools, machinery or perform tasks that involve climbing or major physical exertion for 24 hours (because of the sedation medicines used during the test).  ° °SYMPTOMS TO REPORT IMMEDIATELY: °A gastroenterologist can be reached at any hour. Please call [phone]  for any of the following symptoms:  °Following lower endoscopy (colonoscopy, flexible sigmoidoscopy) °Excessive amounts of blood in the stool  °Significant tenderness, worsening of abdominal pains  °Swelling of the abdomen that is new, acute  °Fever of 100° or  higher  °Following upper endoscopy (EGD, EUS, ERCP, esophageal dilation) °Vomiting of blood or coffee ground material  °New, significant abdominal pain  °New, significant chest pain or pain under the shoulder blades  °Painful or persistently difficult swallowing  °New shortness of breath  °Black, tarry-looking or red, bloody stools ° °FOLLOW UP:  °If any biopsies were taken you will be contacted by phone or by letter within the next 1-3 weeks. Call [phone]  if you have not heard about the biopsies in 3 weeks.  °Please also call with any specific questions about appointments or follow up tests. ° °

## 2019-07-22 NOTE — Transfer of Care (Signed)
Immediate Anesthesia Transfer of Care Note  Patient: Bradley [name]  Procedure(s) Performed: COLONOSCOPY WITH PROPOFOL (N/A ) BIOPSY POLYPECTOMY HEMOSTASIS CLIP PLACEMENT  Patient Location: PACU  Anesthesia Type:MAC  Level of Consciousness: sedated  Airway & Oxygen Therapy: Patient Spontanous Breathing and Patient connected to face mask oxygen  Post-op Assessment: Report given to RN and Post -op Vital signs reviewed and stable  Post vital signs: Reviewed and stable  Last Vitals:  Vitals Value Taken Time  BP    Temp    Pulse    Resp    SpO2      Last Pain:  Vitals:   07/22/19 1137  TempSrc: Oral  PainSc: 0-No pain         Complications: No apparent anesthesia complications

## 2019-07-22 NOTE — H&P (Signed)
History:  This patient presents for endoscopic testing for positive cologuard.  Bradley [name] Referring physician: [name], MD  Past Medical History: Past Medical History:  Diagnosis Date  . Anxiety   . Aortic stenosis    s/p Bentall with bioprosthetic AVR 02/2010; Last echo (9/11): Moderate LVH, EF 45-50%, AVR functioning appropriately, aortic valve mean gradient 21, diastolic dysfunction. Chest MRA (2/13): Mild to moderate dilatation at the sinus of Valsalva at 4.1 cm, mild dilatation ascending aorta distal to the tube graft at 3.9 cm, moderate dilatation of the innominate artery a 2.1 cm;    . CHF (congestive heart failure) ([place])   . Depression   . ESRD on dialysis [facility])    a. 09/2013 felt to be related to gentamycin b. no longer requiring HD  . Hx of cardiac cath    a. LHC in 02/2010: normal cors  . Hx of echocardiogram 02/2014   Echo (8/15):  Mod LV, EF 35-40%, Gr 1 DD, AVR ok (mean 14 mmHg), mild LAE  . Hypertension   . Hypothyroidism, postsurgical   . Prosthetic valve endocarditis ([place])   . Staphylococcus aureus bacteremia   . Thyroid cancer ([place])   . Ventricular tachycardia ([place])    a. s/p STJ ICD     Past Surgical History: Past Surgical History:  Procedure Laterality Date  . AORTIC VALVE REPAIR  1968  . AORTIC VALVE REPLACEMENT  2011  . AV FISTULA PLACEMENT Left 10/08/2013   Procedure: ARTERIOVENOUS (AV) FISTULA CREATION- LEFT ARM; Radial Cephalic ;  Surgeon: Mal [name], MD;  Location: [place];  Service: Vascular;  Laterality: Left;  . BENTALL PROCEDURE N/A 10/28/2013   Procedure: REDO BENTALL PROCEDURE, debridment of aoritc root abscess, replacement of aortic root, ascending aorta and aortic valve with homograft. Insertion of left femoral arterial line;  Surgeon: [name], MD;  Location: [place];  Service: Open Heart Surgery;  Laterality: N/A;  . CARDIAC CATHETERIZATION  03/02/2010   NORMAL CORONARY ARTERY  . CARDIAC CATHETERIZATION N/A 01/01/2015   Procedure: Coronary/Graft Angiography;  Surgeon: Troy Sine, MD;  Location: [place] CV LAB;  Service: Cardiovascular;  Laterality: N/A;  . CARDIAC VALVE REPLACEMENT    . EP IMPLANTABLE DEVICE N/A 01/01/2015   Procedure: ICD Implant;  Surgeon: Deboraha Sprang, MD;  Location: [place] CV LAB;  Service: Cardiovascular;  Laterality: N/A;  . INTRAOPERATIVE TRANSESOPHAGEAL ECHOCARDIOGRAM N/A 10/28/2013   Procedure: INTRAOPERATIVE TRANSESOPHAGEAL ECHOCARDIOGRAM;  Surgeon: [name], MD;  Location: [place];  Service: Open Heart Surgery;  Laterality: N/A;  . PERIPHERAL VASCULAR CATHETERIZATION N/A 01/01/2015   Procedure: Aortic Arch Angiography;  Surgeon: Troy Sine, MD;  Location: [place] CV LAB;  Service: Cardiovascular;  Laterality: N/A;  . SHOULDER ARTHROSCOPY W/ ROTATOR CUFF REPAIR Right 2012  . STERNOTOMY     REDO  . TEE WITHOUT CARDIOVERSION N/A 10/24/2013   Procedure: TRANSESOPHAGEAL ECHOCARDIOGRAM (TEE);  Surgeon: Dorothy Spark, MD;  [facility] ENDOSCOPY;  Service: Cardiovascular;  Laterality: N/A;  . THYROIDECTOMY  ~ 2005  . TRANSTHORACIC ECHOCARDIOGRAM  03/2010   SHOWED MILD REDUCTION OF LV FUNCTION    Allergies: Allergies  Allergen Reactions  . Oxycodone Other (See Comments)    Gives patient nightmares  . Rifampin Nausea Only    Outpatient Meds: Current Facility-Administered Medications  Medication Dose Route Frequency Provider Last Rate Last Admin  . 0.9 %  sodium chloride infusion   Intravenous Continuous Doran Stabler, MD      .  lactated ringers infusion   Intravenous Continuous Nelida Meuse III, MD 20 mL/hr at 07/22/19 1156 New Bag at 07/22/19 1156      ___________________________________________________________________ Objective   Exam:  BP 111/65   Pulse (!) 53   Temp 97.9 F (36.6 C) (Oral)   Resp 12   Ht 6\' 1"  (1.854 m)   Wt 79.8 kg   SpO2 100%   BMI 23.22 kg/m    CV: RRR without murmur, S1/S2, no JVD, no peripheral edema.   AICD upper chest wall  Resp: clear to auscultation bilaterally, normal RR and effort noted  GI: soft, no tenderness, with active bowel sounds. No guarding or palpable organomegaly noted.  Neuro: awake, alert and oriented x 3. Normal gross motor function and fluent speech   Assessment:  Positive cologuard   Plan:  colonoscopy   Nelida Meuse III

## 2019-07-22 NOTE — Interval H&P Note (Signed)
History and Physical Interval Note:  07/22/2019 12:09 PM  Bradley [name]  has presented today for surgery, with the diagnosis of Screening colonoscopy.  The various methods of treatment have been discussed with the patient and family. After consideration of risks, benefits and other options for treatment, the patient has consented to  Procedure(s): COLONOSCOPY WITH PROPOFOL (N/A) as a surgical intervention.  The patient's history has been reviewed, patient examined, no change in status, stable for surgery.  I have reviewed the patient's chart and labs.  Questions were answered to the patient's satisfaction.     Nelida Meuse III

## 2019-07-22 NOTE — Anesthesia Procedure Notes (Signed)
Date/Time: 07/22/2019 12:17 PM Performed by: Talbot Grumbling, CRNA Oxygen Delivery Method: Simple face mask

## 2019-07-22 NOTE — Anesthesia Preprocedure Evaluation (Addendum)
Anesthesia Evaluation  Patient identified by MRN, date of birth, ID band Patient awake    Reviewed: Allergy & Precautions, NPO status , Patient's Chart, lab work & pertinent test results  History of Anesthesia Complications Negative for: history of anesthetic complications  Airway Mallampati: II  TM Distance: >3 FB Neck ROM: Full    Dental  (+) Dental Advisory Given   Pulmonary    breath sounds clear to auscultation       Cardiovascular hypertension, Pt. on medications +CHF  + Cardiac Defibrillator + Valvular Problems/Murmurs  Rhythm:Regular  2020 tte:   1. Left ventricular ejection fraction, by visual estimation, is 30 to 35%. The left ventricle has moderate to severely decreased function. There is mildly increased left ventricular hypertrophy.  2. Multiple segmental abnormalities exist. The inferior wall is akinetic and thin, consistent with scar. There appears to be hypokinesis in the mid-distal LAD artery distribution (versus LBBB related artifact).  3. Abnormal septal motion consistent with post-operative status and abnormal septal motion consistent with left bundle branch block.  4. Elevated left atrial pressure.  5. Left ventricular diastolic parameters are consistent with Grade II diastolic dysfunction (pseudonormalization).  6. Global right ventricle has mildly reduced systolic function.The right ventricular size is mildly enlarged. No increase in right ventricular wall thickness.  7. Left atrial size was moderately dilated.  8. Right atrial size was mildly dilated.  9. The mitral valve is normal in structure. Trivial mitral valve regurgitation. 10. The tricuspid valve is normal in structure. Tricuspid valve regurgitation is mild. 11. Aortic valve regurgitation is trivial. 12. The pulmonic valve was not well visualized. Pulmonic valve regurgitation is not visualized. 13. Mildly elevated pulmonary artery systolic  pressure. 14. The tricuspid regurgitant velocity is 2.57 m/s, and with an assumed right atrial pressure of 8 mmHg, the estimated right ventricular systolic pressure is mildly elevated at 34.4 mmHg. 15. A pacer wire is visualized. 16. No intracardiac thrombi or masses were visualized. 17. The left ventricle demonstrates regional wall motion abnormalities.   Neuro/Psych PSYCHIATRIC DISORDERS Anxiety Depression    GI/Hepatic   Endo/Other  Hypothyroidism   Renal/GU Renal disease     Musculoskeletal   Abdominal   Peds  Hematology   Anesthesia Other Findings 52 y/omale witha complicated past medical history including thyroid cancer (s/p thyroidectomy), hypertension, depression, and congenital aortic stenosis for which he underwent aortic vavulotomy at age 56 with subsequent Bentall procedure in 2001 with a pericardial tissue valve. He had MSSA bacteremia and bacterial endocarditis in 08/8839 complicated by renal failure requiring dialysis for a short period of time. 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   He was admitted 11/2014 with progressive episodes of palpitations and pre-syncope. He was found to have monomorphic ventricular tachycardia. Catheterization demonstrated normal coronaries. Echocardiogram demonstrated EF 30-35% and he underwent STJ dual chamber ICD implant.  Reproductive/Obstetrics                            Anesthesia Physical Anesthesia Plan  ASA: III  Anesthesia Plan: MAC   Post-op Pain Management:    Induction: Intravenous  PONV Risk Score and Plan: 1 and Propofol infusion and Treatment may vary due to age or medical condition  Airway Management Planned: Nasal Cannula  Additional Equipment: None  Intra-op Plan:   Post-operative Plan:   Informed Consent: I have reviewed the  patients  History and Physical, chart, labs and discussed the procedure including the risks, benefits and alternatives for the proposed anesthesia with the patient or authorized representative who has indicated his/her understanding and acceptance.     Dental advisory given  Plan Discussed with: CRNA and Surgeon  Anesthesia Plan Comments:         Anesthesia Quick Evaluation

## 2019-07-23 ENCOUNTER — Encounter: Payer: Self-pay | Admitting: *Deleted

## 2019-07-23 ENCOUNTER — Encounter: Payer: Medicare HMO | Admitting: Gastroenterology

## 2019-07-23 LAB — SURGICAL PATHOLOGY

## 2019-07-24 ENCOUNTER — Encounter: Payer: Self-pay | Admitting: Gastroenterology

## 2019-07-24 NOTE — Progress Notes (Signed)
Letter printed and mailed to patient; recall placed in Epic;

## 2019-08-08 DIAGNOSIS — I5042 Chronic combined systolic (congestive) and diastolic (congestive) heart failure: Secondary | ICD-10-CM | POA: Diagnosis not present

## 2019-08-11 ENCOUNTER — Other Ambulatory Visit (HOSPITAL_COMMUNITY): Payer: Self-pay | Admitting: Internal Medicine

## 2019-08-11 ENCOUNTER — Ambulatory Visit (INDEPENDENT_AMBULATORY_CARE_PROVIDER_SITE_OTHER): Payer: Medicare HMO

## 2019-08-11 DIAGNOSIS — I5022 Chronic systolic (congestive) heart failure: Secondary | ICD-10-CM | POA: Diagnosis not present

## 2019-08-11 DIAGNOSIS — Z95810 Presence of automatic (implantable) cardiac defibrillator: Secondary | ICD-10-CM | POA: Diagnosis not present

## 2019-08-12 LAB — CUP PACEART INCLINIC DEVICE CHECK
Date Time Interrogation Session: 20200904095306
Implantable Lead Implant Date: 20160603
Implantable Lead Location: 753860
Implantable Lead Model: 7122
Implantable Pulse Generator Implant Date: 20160603
Pulse Gen Serial Number: 7135169

## 2019-08-12 NOTE — Progress Notes (Signed)
EPIC Encounter for ICM Monitoring  Patient Name: Bradley [name] is a 64 y.o. male Date: 08/12/2019 Primary Care Physican: [name], MD Heart Failure Clinic:[facility] Electrophysiologist: Caryl Comes Nephrologist:Deterding 08/12/2019 [id number]  Spoke with wife. Patient is doing well and no fluid symptoms at this time.  He has started on Farxiga.  CorVue thoracic impedance normal but suggesting fluid accumulation from 12/11 - 12/27.  Prescribed:Furosemide 20 mgtake1 tablettwice a week.   Labs: 06/11/2019 Creatinine 1.12, BUN 24, Potassium 4.7, Sodium 142, GFR 66.36 04/04/2019 Creatinine 1.01, BUN 18, Potassium 4.1, Sodium 142, GFR 79-92  Recommendations:  No changes and encouraged to call if experiencing any fluid symptoms.  Follow-up plan: ICM clinic phone appointment on2/06/2020. 91 day device clinic remote transmission 09/11/2019.      Copy of ICM check sent to Dr. Caryl Comes.   3 month ICM trend: 08/11/2019    1 Year ICM trend:       [name], RN 08/12/2019 4:41 PM

## 2019-08-27 ENCOUNTER — Other Ambulatory Visit: Payer: Self-pay | Admitting: Internal Medicine

## 2019-08-27 NOTE — Telephone Encounter (Signed)
This pt is a CHF pt now per Dr. Harrington Challenger. Please address

## 2019-08-28 ENCOUNTER — Other Ambulatory Visit: Payer: Self-pay | Admitting: Internal Medicine

## 2019-08-29 ENCOUNTER — Other Ambulatory Visit: Payer: Self-pay | Admitting: Family Medicine

## 2019-09-01 ENCOUNTER — Other Ambulatory Visit (HOSPITAL_COMMUNITY): Payer: Self-pay | Admitting: Internal Medicine

## 2019-09-01 ENCOUNTER — Other Ambulatory Visit: Payer: Self-pay | Admitting: Family Medicine

## 2019-09-01 ENCOUNTER — Other Ambulatory Visit: Payer: Self-pay | Admitting: Internal Medicine

## 2019-09-04 ENCOUNTER — Other Ambulatory Visit: Payer: Self-pay | Admitting: Internal Medicine

## 2019-09-04 MED ORDER — MEXILETINE HCL 150 MG PO CAPS: 300.0000 mg | ORAL_CAPSULE | Freq: Two times a day (BID) | ORAL | 2 refills | Status: DC

## 2019-09-08 DIAGNOSIS — I5042 Chronic combined systolic (congestive) and diastolic (congestive) heart failure: Secondary | ICD-10-CM | POA: Diagnosis not present

## 2019-09-11 ENCOUNTER — Ambulatory Visit (INDEPENDENT_AMBULATORY_CARE_PROVIDER_SITE_OTHER): Payer: Medicare HMO | Admitting: *Deleted

## 2019-09-11 DIAGNOSIS — I472 Ventricular tachycardia, unspecified: Secondary | ICD-10-CM

## 2019-09-11 LAB — CUP PACEART REMOTE DEVICE CHECK
Battery Remaining Longevity: 55 mo
Battery Remaining Percentage: 52 %
Battery Voltage: 2.9 V
Brady Statistic RV Percent Paced: 1 %
Date Time Interrogation Session: 20210211020016
HighPow Impedance: 68 Ohm
HighPow Impedance: 68 Ohm
Implantable Lead Implant Date: 20160603
Implantable Lead Location: 753860
Implantable Lead Model: 7122
Implantable Pulse Generator Implant Date: 20160603
Lead Channel Impedance Value: 360 Ohm
Lead Channel Pacing Threshold Amplitude: 1.25 V
Lead Channel Pacing Threshold Pulse Width: 0.5 ms
Lead Channel Sensing Intrinsic Amplitude: 11.7 mV
Lead Channel Setting Pacing Amplitude: 2.5 V
Lead Channel Setting Pacing Pulse Width: 0.5 ms
Lead Channel Setting Sensing Sensitivity: 0.5 mV
Pulse Gen Serial Number: 7135169

## 2019-09-11 NOTE — Progress Notes (Signed)
ICD Remote  

## 2019-09-12 ENCOUNTER — Ambulatory Visit (INDEPENDENT_AMBULATORY_CARE_PROVIDER_SITE_OTHER): Payer: Medicare HMO

## 2019-09-12 ENCOUNTER — Telehealth: Payer: Self-pay

## 2019-09-12 DIAGNOSIS — Z95810 Presence of automatic (implantable) cardiac defibrillator: Secondary | ICD-10-CM

## 2019-09-12 DIAGNOSIS — I5022 Chronic systolic (congestive) heart failure: Secondary | ICD-10-CM | POA: Diagnosis not present

## 2019-09-12 NOTE — Telephone Encounter (Signed)
Remote ICM transmission received.  Attempted call to wife/patient regarding ICM remote transmission and left detailed message per DPR to return call.  Advised to return call for any fluid symptoms or questions.

## 2019-09-12 NOTE — Progress Notes (Signed)
EPIC Encounter for ICM Monitoring  Patient Name: Bradley [name] is a 64 y.o. male Date: 09/12/2019 Primary Care Physican: [name], MD Heart Failure Clinic:[facility] Electrophysiologist: Caryl Comes Nephrologist:Deterding 08/12/2019 [id number]  Attempted call to wife/patient and unable to reach.  Left message to return call. Transmission reviewed.   CorVue thoracic impedance suggesting possible ongoing fluid accumulation starting 2/7.  Prescribed:Furosemide 20 mgtake1 tablettwice a week.   Labs: 06/11/2019 Creatinine 1.12, BUN 24, Potassium 4.7, Sodium 142, GFR 66.36 04/04/2019 Creatinine 1.01, BUN 18, Potassium 4.1, Sodium 142, GFR 79-92  Recommendations:  Left voice mail with ICM number and encouraged to call if experiencing any fluid symptoms.  Follow-up plan: ICM clinic phone appointment on 09/22/2019 to recheck fluid levels.   91 day device clinic remote transmission 12/11/2019.  Office appt 09/18/2019 with Oda Kilts, PA.    Copy of ICM check sent to Dr. Caryl Comes and Dr Haroldine Laws.   3 month ICM trend: 09/11/2019    1 Year ICM trend:       [name], RN 09/12/2019 4:48 PM

## 2019-09-18 ENCOUNTER — Encounter: Payer: Medicare HMO | Admitting: Student

## 2019-09-22 ENCOUNTER — Ambulatory Visit (INDEPENDENT_AMBULATORY_CARE_PROVIDER_SITE_OTHER): Payer: Medicare HMO

## 2019-09-22 DIAGNOSIS — Z95810 Presence of automatic (implantable) cardiac defibrillator: Secondary | ICD-10-CM

## 2019-09-22 DIAGNOSIS — I5022 Chronic systolic (congestive) heart failure: Secondary | ICD-10-CM

## 2019-09-23 NOTE — Progress Notes (Signed)
EPIC Encounter for ICM Monitoring  Patient Name: SHAD LEDVINA is a 64 y.o. male Date: 09/23/2019 Primary Care Physican: [name], MD Heart Failure Clinic:[facility] Electrophysiologist: Caryl Comes Nephrologist:Deterding 1/12/2021Weight180lbs  Transmission reviewed.   CorVue thoracic impedance returned to normal since 2/12 remote transmission.  Prescribed:Furosemide 20 mgtake1 tablettwice a week.   Labs: 06/11/2019 Creatinine 1.12, BUN 24, Potassium 4.7, Sodium 142, GFR 66.36 04/04/2019 Creatinine 1.01, BUN 18, Potassium 4.1, Sodium 142, GFR 79-92  Recommendations: None  Follow-up plan: ICM clinic phone appointment on 10/20/2019.   91 day device clinic remote transmission 12/11/2019.  Office appt 09/29/2019 with Oda Kilts, PA.    Copy of ICM check sent to Dr. Caryl Comes.  3 month ICM trend: 09/22/2019    1 Year ICM trend:       [name], RN 09/23/2019 4:44 PM

## 2019-09-29 ENCOUNTER — Encounter: Payer: Self-pay | Admitting: Student

## 2019-09-29 ENCOUNTER — Ambulatory Visit (INDEPENDENT_AMBULATORY_CARE_PROVIDER_SITE_OTHER): Payer: Medicare HMO | Admitting: Student

## 2019-09-29 ENCOUNTER — Other Ambulatory Visit: Payer: Self-pay

## 2019-09-29 VITALS — BP 108/62 | HR 52 | Ht 73.0 in | Wt 186.4 lb

## 2019-09-29 DIAGNOSIS — Z95810 Presence of automatic (implantable) cardiac defibrillator: Secondary | ICD-10-CM | POA: Diagnosis not present

## 2019-09-29 DIAGNOSIS — I10 Essential (primary) hypertension: Secondary | ICD-10-CM

## 2019-09-29 DIAGNOSIS — I472 Ventricular tachycardia, unspecified: Secondary | ICD-10-CM

## 2019-09-29 DIAGNOSIS — I428 Other cardiomyopathies: Secondary | ICD-10-CM

## 2019-09-29 DIAGNOSIS — I5042 Chronic combined systolic (congestive) and diastolic (congestive) heart failure: Secondary | ICD-10-CM | POA: Diagnosis not present

## 2019-09-29 LAB — CUP PACEART INCLINIC DEVICE CHECK
Battery Remaining Longevity: 56 mo
Brady Statistic RV Percent Paced: 0.07 %
Date Time Interrogation Session: 20210301110314
HighPow Impedance: 58.5 Ohm
Implantable Lead Implant Date: 20160603
Implantable Lead Location: 753860
Implantable Lead Model: 7122
Implantable Pulse Generator Implant Date: 20160603
Lead Channel Impedance Value: 362.5 Ohm
Lead Channel Pacing Threshold Amplitude: 1.25 V
Lead Channel Pacing Threshold Amplitude: 1.25 V
Lead Channel Pacing Threshold Pulse Width: 0.5 ms
Lead Channel Pacing Threshold Pulse Width: 0.5 ms
Lead Channel Sensing Intrinsic Amplitude: 11.7 mV
Lead Channel Setting Pacing Amplitude: 2.5 V
Lead Channel Setting Pacing Pulse Width: 0.5 ms
Lead Channel Setting Sensing Sensitivity: 0.5 mV
Pulse Gen Serial Number: 7135169

## 2019-09-29 NOTE — Progress Notes (Signed)
Electrophysiology Office Note Date: 09/29/2019  ID:  KYLAR LEONHARDT, DOB 01-23-1956, MRN [id number]  PCP: [name], MD Primary Cardiologist: Dorris Carnes, MD Electrophysiologist: [name], MD   CC: Routine ICD follow-up  KELLIE CHISOLM is a 64 y.o. male seen today for Dr. Caryl Comes.  They present to discuss barostim consideration.  Since last being seen in our clinic, the patient reports doing very well. They deny chest pain, palpitations, PND, orthopnea, nausea, vomiting, dizziness, syncope, edema, weight gain, or early satiety.  He has not had ICD shocks. He has mild dyspnea walking up stairs, but otherwise does everything he wants to do without difficulty.   Device History: [employer] Jude Single Chamber ICD implanted 12/2014 for secondary prevention History of appropriate therapy: No History of AAD therapy: Yes On mexilitene/Ranexa   Past Medical History:  Diagnosis Date  . Anxiety   . Aortic stenosis    s/p Bentall with bioprosthetic AVR 02/2010; Last echo (9/11): Moderate LVH, EF 45-50%, AVR functioning appropriately, aortic valve mean gradient 21, diastolic dysfunction. Chest MRA (2/13): Mild to moderate dilatation at the sinus of Valsalva at 4.1 cm, mild dilatation ascending aorta distal to the tube graft at 3.9 cm, moderate dilatation of the innominate artery a 2.1 cm;    . CHF (congestive heart failure) ([place])   . Depression   . ESRD on dialysis [facility])    a. 09/2013 felt to be related to gentamycin b. no longer requiring HD  . Hx of cardiac cath    a. LHC in 02/2010: normal cors  . Hx of echocardiogram 02/2014   Echo (8/15):  Mod LV, EF 35-40%, Gr 1 DD, AVR ok (mean 14 mmHg), mild LAE  . Hypertension   . Hypothyroidism, postsurgical   . Prosthetic valve endocarditis ([place])   . Staphylococcus aureus bacteremia   . Thyroid cancer ([place])   . Ventricular tachycardia ([place])    a. s/p STJ ICD   Past Surgical History:  Procedure Laterality Date  . AORTIC VALVE REPAIR  1968  .  AORTIC VALVE REPLACEMENT  2011  . AV FISTULA PLACEMENT Left 10/08/2013   Procedure: ARTERIOVENOUS (AV) FISTULA CREATION- LEFT ARM; Radial Cephalic ;  Surgeon: Mal [name], MD;  Location: [place];  Service: Vascular;  Laterality: Left;  . BENTALL PROCEDURE N/A 10/28/2013   Procedure: REDO BENTALL PROCEDURE, debridment of aoritc root abscess, replacement of aortic root, ascending aorta and aortic valve with homograft. Insertion of left femoral arterial line;  Surgeon: [name], MD;  Location: [place];  Service: Open Heart Surgery;  Laterality: N/A;  . BIOPSY  07/22/2019   Procedure: BIOPSY;  Surgeon: Doran Stabler, MD;  Location: [facility];  Service: Gastroenterology;;  . CARDIAC CATHETERIZATION  03/02/2010   NORMAL CORONARY ARTERY  . CARDIAC CATHETERIZATION N/A 01/01/2015   Procedure: Coronary/Graft Angiography;  Surgeon: Troy Sine, MD;  Location: [place] [facility];  Service: Cardiovascular;  Laterality: N/A;  . CARDIAC VALVE REPLACEMENT    . COLONOSCOPY WITH PROPOFOL N/A 07/22/2019   Procedure: COLONOSCOPY WITH PROPOFOL;  Surgeon: Doran Stabler, MD;  Location: [facility];  Service: Gastroenterology;  Laterality: N/A;  . EP IMPLANTABLE DEVICE N/A 01/01/2015   Procedure: ICD Implant;  Surgeon: Deboraha Sprang, MD;  Location: [place] [facility];  Service: Cardiovascular;  Laterality: N/A;  . HEMOSTASIS CLIP PLACEMENT  07/22/2019   Procedure: HEMOSTASIS CLIP PLACEMENT;  Surgeon: Doran Stabler, MD;  [facility];  Service: Gastroenterology;;  . INTRAOPERATIVE TRANSESOPHAGEAL ECHOCARDIOGRAM N/A 10/28/2013   Procedure: INTRAOPERATIVE TRANSESOPHAGEAL ECHOCARDIOGRAM;  Surgeon: [name], MD;  Location: [place];  Service: Open Heart Surgery;  Laterality: N/A;  . PERIPHERAL VASCULAR CATHETERIZATION N/A 01/01/2015   Procedure: Aortic Arch Angiography;  Surgeon: Troy Sine, MD;  Location: [place] [facility];  Service: Cardiovascular;  Laterality: N/A;  . POLYPECTOMY   07/22/2019   Procedure: POLYPECTOMY;  Surgeon: Doran Stabler, MD;  Location: [facility];  Service: Gastroenterology;;  . SHOULDER ARTHROSCOPY W/ ROTATOR CUFF REPAIR Right 2012  . STERNOTOMY     REDO  . TEE WITHOUT CARDIOVERSION N/A 10/24/2013   Procedure: TRANSESOPHAGEAL ECHOCARDIOGRAM (TEE);  Surgeon: Dorothy Spark, MD;  Location: [facility];  Service: Cardiovascular;  Laterality: N/A;  . THYROIDECTOMY  ~ 2005  . TRANSTHORACIC ECHOCARDIOGRAM  03/2010   SHOWED MILD REDUCTION OF LV FUNCTION    Current Outpatient Medications  Medication Sig Dispense Refill  . acetaminophen (TYLENOL) 325 MG tablet Take 325 mg by mouth 2 (two) times daily as needed for moderate pain or headache.    Marland Kitchen aspirin EC 81 MG tablet Take 81 mg by mouth at bedtime.     Marland Kitchen atorvastatin (LIPITOR) 40 MG tablet TAKE 1 TABLET BY MOUTH EVERY DAY 90 tablet 1  . carvedilol (COREG) 6.25 MG tablet TAKE 1 TABLET (6.25 MG TOTAL) BY MOUTH 2 (TWO) TIMES DAILY WITH A MEAL. 180 tablet 3  . clonazePAM (KLONOPIN) 1 MG tablet TAKE 1 TABLET BY MOUTH TWICE A DAY AS NEEDED *NOT FOR REGULAR USE* (Patient taking differently: Take 0.5 mg by mouth at bedtime. ) 30 tablet 0  . dapagliflozin propanediol (FARXIGA) 10 MG TABS tablet Take 10 mg by mouth daily before breakfast. 30 tablet 3  . digoxin (LANOXIN) 0.125 MG tablet TAKE 1 TABLET BY MOUTH EVERY DAY 90 tablet 3  . docusate sodium (STOOL SOFTENER) 100 MG capsule Take 100 mg by mouth 2 (two) times daily.    . Ensure (ENSURE) Take 237 mLs by mouth daily as needed. (Patient taking differently: Take 237 mLs by mouth at bedtime. ) 237 mL 12  . ENTRESTO 97-103 MG TAKE 1 TABLET BY MOUTH TWICE A DAY 180 tablet 1  . furosemide (LASIX) 20 MG tablet TAKE 1 TABLET (20 MG TOTAL) BY MOUTH 4 (FOUR) TIMES A WEEK. (Patient taking differently: Take 20 mg by mouth 2 (two) times a week. ) 45 tablet 3  . levothyroxine (SYNTHROID) 175 MCG tablet TAKE 1 TABLET (175 MCG TOTAL) BY MOUTH DAILY BEFORE  BREAKFAST. 90 tablet 3  . mexiletine (MEXITIL) 150 MG capsule Take 2 capsules (300 mg total) by mouth 2 (two) times daily. 360 capsule 2  . ranolazine (RANEXA) 500 MG 12 hr tablet Take 1 tablet (500 mg total) by mouth 2 (two) times daily. Please schedule appt for future refills. 1st attempt 180 tablet 0  . Salicylic Acid (WART REMOVER EX) Apply 1 application topically daily as needed (foot corns).    . sertraline (ZOLOFT) 50 MG tablet TAKE 1 TABLET BY MOUTH EVERY DAY 90 tablet 2   No current facility-administered medications for this visit.    Allergies:   Oxycodone and Rifampin   Social History: Social History   Socioeconomic History  . Marital status: Married    Spouse name: Not on file  . Number of children: Not on file  . Years of education: Not on file  . Highest education level: Not on file  Occupational History  .  Not on file  Tobacco Use  . Smoking status: Never Smoker  . Smokeless tobacco: Never Used  Substance and Sexual Activity  . Alcohol use: No    Alcohol/week: 0.0 standard drinks    Comment: Occasional  . Drug use: No  . Sexual activity: Never  Other Topics Concern  . Not on file  Social History Narrative   Lives at home with walker.  Does not drive.  ES[employer] T, H, [employer].  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   Social Determinants of Health   Financial Resource Strain:   . Difficulty of Paying Living Expenses: Not on file  Food Insecurity:   . Worried About Charity fundraiser in the Last Year: Not on file  . Ran Out of Food in the Last Year: Not on file  Transportation Needs:   . Lack of Transportation (Medical): Not on file  . Lack of Transportation (Non-Medical): Not on file  Physical Activity:   . Days of Exercise per Week: Not on file  . Minutes of Exercise per Session: Not on file  Stress:   . Feeling of Stress : Not on file  Social Connections:   . Frequency of Communication with Friends and Family: Not on file  . Frequency of Social Gatherings with  Friends and Family: Not on file  . Attends Religious Services: Not on file  . Active Member of Clubs or Organizations: Not on file  . Attends Archivist Meetings: Not on file  . Marital Status: Not on file  Intimate Partner Violence:   . Fear of Current or Ex-Partner: Not on file  . Emotionally Abused: Not on file  . Physically Abused: Not on file  . Sexually Abused: Not on file    Family History: Family History  Problem Relation Age of Onset  . Hypertension Mother   . Heart disease Father   . Stroke Father   . Hypertension Father   . Thyroid cancer Sister   . Thyroid cancer Sister   . Kidney Stones Sister   . Heart attack Neg Hx   . Colon cancer Neg Hx   . Colon polyps Neg Hx   . Esophageal cancer Neg Hx   . Stomach cancer Neg Hx   . Rectal cancer Neg Hx     Review of Systems: All other systems reviewed and are otherwise negative except as noted above.   Physical Exam: Vitals:   09/29/19 1006  BP: 108/62  Pulse: (!) 52  SpO2: 98%  Weight: 186 lb 6.4 oz (84.6 kg)  Height: 6\' 1"  (1.854 m)     GEN- The patient is well appearing, alert and oriented x 3 today.   HEENT: normocephalic, atraumatic; sclera clear, conjunctiva pink; hearing intact; oropharynx clear; neck supple, no JVP Lymph- no cervical lymphadenopathy Lungs- Clear to ausculation bilaterally, normal work of breathing.  No wheezes, rales, rhonchi Heart- Regular rate and rhythm, no murmurs, rubs or gallops, PMI not laterally displaced GI- soft, non-tender, non-distended, bowel sounds present, no hepatosplenomegaly Extremities- no clubbing, cyanosis, or edema; DP/PT/radial pulses 2+ bilaterally MS- no significant deformity or atrophy Skin- warm and dry, no rash or lesion; ICD pocket well healed Psych- euthymic mood, full affect Neuro- strength and sensation are intact  ICD interrogation- Remote from 09/11/2019 reviewed in detail today,  See PACEART report for further. No episodes. + Magnet  response. Histograms appear appropriate. Pt activity widely varied from 1 to 4 hours. Thoracic impedence depressed indicating volume overload.   EKG:  EKG is ordered today. The ekg ordered today shows Sinus bradycardia at 52 bpm with a QRS of 146 ms.  Recent Labs: 06/11/2019: ALT 18; BUN 24; Creatinine, Ser 1.12; Hemoglobin 14.0; Platelets 112.0; Potassium 4.7; Sodium 142; TSH 1.75   Wt Readings from Last 3 Encounters:  09/29/19 186 lb 6.4 oz (84.6 kg)  07/22/19 176 lb (79.8 kg)  07/11/19 181 lb (82.1 kg)     Other studies Reviewed: Additional studies/ records that were reviewed today include: Previous EP notes, Previous HF notes, most recent Echo, most recent labwork, ICD implant note   Assessment and Plan:  1.  Chronic systolic dysfunction s/p [employer] dual chamber ICD  euvolemic today Stable on an appropriate medical regimen Normal ICD function See Pace Art report No changes today Body mass index is 24.59 kg/m.  Cr 1.12 NYHA II-III symptoms QRS is 146 ms today. He is not a candidate for Barostim, but would be a candidate for CRT down the road.   2. VT Quiescent on mexilitene and Ranexa  3. HTN Stable on current regiment  4. Valvular heart disease S/p Bentall 2001 and re--do bentall 2015 Stable on most recent echo  5. Depression  Stable.   Current medicines are reviewed at length with the patient today.   The patient does not have concerns regarding his medicines.  The following changes were made today:  none  Labs/ tests ordered today include:  Orders Placed This Encounter  Procedures  . EKG 12-Lead     Disposition:   Follow up as scheduled. Pt is not a barostim candidate.   Jacalyn Lefevre, PA-C  09/29/2019 10:54 AM  [address] [place] [address] [phone] (office) [phone] (fax)

## 2019-09-29 NOTE — Patient Instructions (Addendum)
Medication Instructions:  none *If you need a refill on your cardiac medications before your next appointment, please call your pharmacy*   Lab Work: none If you have labs (blood work) drawn today and your tests are completely normal, you will receive your results only by: Marland Kitchen MyChart Message (if you have MyChart) OR . A paper copy in the mail If you have any lab test that is abnormal or we need to change your treatment, we will call you to review the results.   Testing/Procedures: none   Follow-Up: September 2021 with Dr Caryl Comes At [facility], you and your health needs are our priority.  As part of our continuing mission to provide you with exceptional heart care, we have created designated Provider Care Teams.  These Care Teams include your primary Cardiologist (physician) and Advanced Practice Providers (APPs -  Physician Assistants and Nurse Practitioners) who all work together to provide you with the care you need, when you need it.  We recommend signing up for the patient portal called "MyChart".  Sign up information is provided on this After Visit Summary.  MyChart is used to connect with patients for Virtual Visits (Telemedicine).  Patients are able to view lab/test results, encounter notes, upcoming appointments, etc.  Non-urgent messages can be sent to your provider as well.   To learn more about what you can do with MyChart, go to [email or web address].      Other Instructions Remote monitoring is used to monitor your  ICD from home. This monitoring reduces the number of office visits required to check your device to one time per year. It allows [place] to keep an eye on the functioning of your device to ensure it is working properly. You are scheduled for a device check from home on 10/20/19. You may send your transmission at any time that day. If you have a wireless device, the transmission will be sent automatically. After your physician reviews your transmission, you will receive  a postcard with your next transmission date.

## 2019-10-05 ENCOUNTER — Other Ambulatory Visit: Payer: Self-pay | Admitting: Internal Medicine

## 2019-10-06 ENCOUNTER — Other Ambulatory Visit: Payer: Self-pay

## 2019-10-06 DIAGNOSIS — I5042 Chronic combined systolic (congestive) and diastolic (congestive) heart failure: Secondary | ICD-10-CM | POA: Diagnosis not present

## 2019-10-06 MED ORDER — MEXILETINE HCL 150 MG PO CAPS: 300.0000 mg | ORAL_CAPSULE | Freq: Two times a day (BID) | ORAL | 3 refills | Status: DC

## 2019-10-13 ENCOUNTER — Telehealth: Payer: Self-pay | Admitting: Student

## 2019-10-13 NOTE — Telephone Encounter (Signed)
Pt's wife calling stating that pt's medication mexiletine is no longer covered by his insurance. Jeani Hawking, LPN could you please look into this matter, I think pt needs a prior auth to get his medication. Thanks

## 2019-10-16 NOTE — Telephone Encounter (Signed)
**Note De-Identified  Obfuscation** I started an urgent Mexiletine PA through covermymeds: Key: [id number]

## 2019-10-20 ENCOUNTER — Ambulatory Visit (INDEPENDENT_AMBULATORY_CARE_PROVIDER_SITE_OTHER): Payer: Medicare HMO

## 2019-10-20 DIAGNOSIS — I5042 Chronic combined systolic (congestive) and diastolic (congestive) heart failure: Secondary | ICD-10-CM

## 2019-10-20 DIAGNOSIS — Z95810 Presence of automatic (implantable) cardiac defibrillator: Secondary | ICD-10-CM | POA: Diagnosis not present

## 2019-10-20 NOTE — Telephone Encounter (Signed)
**Note De-Identified  Obfuscation** Following message received from covermymeds:  Donald Siva Key: Ernest Mallick - PA Case ID: [id number]  Key: [id number] Outcome  Approved on March 18  PA Case: [id number]  Status: Approved  Coverage Starts on: 08/01/2019, Coverage Ends on: 07/30/2020.   DrugMexiletine HCl 150MG  capsules  Tessie Fass Electronic PA Form  I have notified [facility] of this approval.

## 2019-10-21 ENCOUNTER — Telehealth: Payer: Self-pay

## 2019-10-21 NOTE — Progress Notes (Signed)
EPIC Encounter for ICM Monitoring  Patient Name: Bradley [name] is a 64 y.o. male Date: 10/21/2019 Primary Care Physican: [name], MD Heart Failure Clinic:[facility] Electrophysiologist: Caryl Comes Nephrologist:Deter[place] 1/12/2021Weight180lbs  Attempted call to patient and unable to reach.  Transmission reviewed.   CorVue thoracic impedance returned to normal since 2/12 remote transmission.  Prescribed:Furosemide 20 mgtake1 tablettwice a week.   Labs: 06/11/2019 Creatinine 1.12, BUN 24, Potassium 4.7, Sodium 142, GFR 66.36 04/04/2019 Creatinine 1.01, BUN 18, Potassium 4.1, Sodium 142, GFR 79-92  Recommendations: Unable to reach.    Follow-up plan: ICM clinic phone appointment on4/26/2021. 91 day device clinic remote transmission 12/11/2019.   Copy of ICM check sent to [place].  3 month ICM trend: 10/20/2019    1 Year ICM trend:       [name], RN 10/21/2019 3:52 PM

## 2019-10-21 NOTE — Telephone Encounter (Signed)
Remote ICM transmission received.  Attempted call to patient regarding ICM remote transmission and no answer or option to leave voice mail message.   

## 2019-10-22 ENCOUNTER — Other Ambulatory Visit (HOSPITAL_COMMUNITY): Payer: Self-pay | Admitting: Internal Medicine

## 2019-11-06 DIAGNOSIS — I5042 Chronic combined systolic (congestive) and diastolic (congestive) heart failure: Secondary | ICD-10-CM | POA: Diagnosis not present

## 2019-11-10 ENCOUNTER — Telehealth: Payer: Self-pay | Admitting: Family Medicine

## 2019-11-10 NOTE — Progress Notes (Signed)
  Chronic Care Management   Note  11/10/2019 Name: Bradley [name] MRN: [id number] DOB: 08/11/1955  Bradley [name] is a 64 y.o. year old male who is a primary care patient of Burchette, Alinda Sierras, MD. I reached out to [name] by phone today in response to a referral sent by Mr. Ella Guillotte Yono's PCP, [name], MD.   Mr. Dhondt was given information about Chronic Care Management services today including:  1. CCM service includes personalized support from designated clinical staff supervised by his physician, including individualized plan of care and coordination with other care providers 2. 24/7 contact phone numbers for assistance for urgent and routine care needs. 3. Service will only be billed when office clinical staff spend 20 minutes or more in a month to coordinate care. 4. Only one practitioner may furnish and bill the service in a calendar month. 5. The patient may stop CCM services at any time (effective at the end of the month) by phone call to the office staff.   Patient agreed to services and verbal consent obtained.   Follow up plan:   Raynicia Dukes [employer] Scheduler

## 2019-11-14 ENCOUNTER — Emergency Department (HOSPITAL_COMMUNITY)
Admission: EM | Admit: 2019-11-14 | Discharge: 2019-11-15 | Disposition: A | Discharge status: Home / Self Care | Payer: Medicare HMO | Attending: Emergency Medicine | Admitting: Emergency Medicine

## 2019-11-14 DIAGNOSIS — S0101XA Laceration without foreign body of scalp, initial encounter: Secondary | ICD-10-CM | POA: Diagnosis not present

## 2019-11-14 DIAGNOSIS — Z20822 Contact with and (suspected) exposure to covid-19: Secondary | ICD-10-CM | POA: Diagnosis not present

## 2019-11-14 DIAGNOSIS — Y9389 Activity, other specified: Secondary | ICD-10-CM | POA: Diagnosis not present

## 2019-11-14 DIAGNOSIS — R58 Hemorrhage, not elsewhere classified: Secondary | ICD-10-CM | POA: Diagnosis not present

## 2019-11-14 DIAGNOSIS — R52 Pain, unspecified: Secondary | ICD-10-CM | POA: Diagnosis not present

## 2019-11-14 DIAGNOSIS — S0990XA Unspecified injury of head, initial encounter: Secondary | ICD-10-CM

## 2019-11-14 DIAGNOSIS — I5042 Chronic combined systolic (congestive) and diastolic (congestive) heart failure: Secondary | ICD-10-CM | POA: Diagnosis not present

## 2019-11-14 DIAGNOSIS — R000 Tachycardia, unspecified: Secondary | ICD-10-CM | POA: Diagnosis not present

## 2019-11-14 DIAGNOSIS — I10 Essential (primary) hypertension: Secondary | ICD-10-CM | POA: Diagnosis not present

## 2019-11-14 DIAGNOSIS — S0003XA Contusion of scalp, initial encounter: Secondary | ICD-10-CM | POA: Diagnosis not present

## 2019-11-14 DIAGNOSIS — Y929 Unspecified place or not applicable: Secondary | ICD-10-CM | POA: Insufficient documentation

## 2019-11-14 DIAGNOSIS — Z95810 Presence of automatic (implantable) cardiac defibrillator: Secondary | ICD-10-CM | POA: Insufficient documentation

## 2019-11-14 DIAGNOSIS — Z7982 Long term (current) use of aspirin: Secondary | ICD-10-CM | POA: Insufficient documentation

## 2019-11-14 DIAGNOSIS — S60414A Abrasion of right ring finger, initial encounter: Secondary | ICD-10-CM | POA: Diagnosis not present

## 2019-11-14 DIAGNOSIS — S0181XA Laceration without foreign body of other part of head, initial encounter: Secondary | ICD-10-CM | POA: Diagnosis not present

## 2019-11-14 DIAGNOSIS — R609 Edema, unspecified: Secondary | ICD-10-CM | POA: Diagnosis not present

## 2019-11-14 DIAGNOSIS — Y999 Unspecified external cause status: Secondary | ICD-10-CM | POA: Diagnosis not present

## 2019-11-14 DIAGNOSIS — S60412A Abrasion of right middle finger, initial encounter: Secondary | ICD-10-CM | POA: Diagnosis not present

## 2019-11-14 DIAGNOSIS — Z79899 Other long term (current) drug therapy: Secondary | ICD-10-CM | POA: Diagnosis not present

## 2019-11-14 DIAGNOSIS — I110 Hypertensive heart disease with heart failure: Secondary | ICD-10-CM | POA: Diagnosis not present

## 2019-11-14 DIAGNOSIS — S60511A Abrasion of right hand, initial encounter: Secondary | ICD-10-CM | POA: Diagnosis not present

## 2019-11-14 DIAGNOSIS — W19XXXA Unspecified fall, initial encounter: Secondary | ICD-10-CM | POA: Diagnosis not present

## 2019-11-14 DIAGNOSIS — Z952 Presence of prosthetic heart valve: Secondary | ICD-10-CM | POA: Diagnosis not present

## 2019-11-14 DIAGNOSIS — W208XXA Other cause of strike by thrown, projected or falling object, initial encounter: Secondary | ICD-10-CM | POA: Diagnosis not present

## 2019-11-15 ENCOUNTER — Encounter (HOSPITAL_COMMUNITY): Payer: Self-pay

## 2019-11-15 ENCOUNTER — Other Ambulatory Visit: Payer: Self-pay

## 2019-11-15 ENCOUNTER — Emergency Department (HOSPITAL_COMMUNITY): Payer: Medicare HMO

## 2019-11-15 DIAGNOSIS — S0101XA Laceration without foreign body of scalp, initial encounter: Secondary | ICD-10-CM | POA: Diagnosis not present

## 2019-11-15 DIAGNOSIS — S0003XA Contusion of scalp, initial encounter: Secondary | ICD-10-CM | POA: Diagnosis not present

## 2019-11-15 DIAGNOSIS — S60412A Abrasion of right middle finger, initial encounter: Secondary | ICD-10-CM | POA: Diagnosis not present

## 2019-11-15 DIAGNOSIS — S60511A Abrasion of right hand, initial encounter: Secondary | ICD-10-CM | POA: Diagnosis not present

## 2019-11-15 MED ORDER — POVIDONE-IODINE 10 % EX SOLN
CUTANEOUS | Status: AC
  Filled 2019-11-15: qty 15

## 2019-11-15 MED ORDER — POVIDONE-IODINE 10 % EX OINT
TOPICAL_OINTMENT | Freq: Once | CUTANEOUS | Status: AC
  Filled 2019-11-15: qty 28.35

## 2019-11-15 MED ORDER — LIDOCAINE-EPINEPHRINE (PF) 2 %-1:200000 IJ SOLN
20.0000 mL | Freq: Once | INTRAMUSCULAR | Status: AC
  Administered 2019-11-15: 20 mL
  Filled 2019-11-15: qty 20

## 2019-11-15 NOTE — Discharge Instructions (Signed)
Your CT scan is negative.  Follow-up with your doctor for suture removal in 1 week.  Return to the ED with headache, focal weakness, numbness, tingling, behavior change, vomiting, chest pain, shortness of breath or any concerns

## 2019-11-15 NOTE — ED Provider Notes (Signed)
[facility] EMERGENCY DEPARTMENT Provider Note   CSN: [id number] Arrival date & time: 11/14/19  2351     History No chief complaint on file.   Bradley [name] is a 64 y.o. male.  Patient presents via EMS after head injury.  States he was on a ladder about 6 feet up and reached for 2 blocks above his head.  The tool box fell and hit his left temple.  He did not fall off the ladder and was able to climb down on his own.  He did not lose consciousness.  He has had some nausea and saw some stars but did not lose consciousness.  No vomiting.  He has a small laceration of his left scalp.  He takes aspirin but no other blood thinners.  He also sustained an abrasion to his right third finger.  He denies falling off the ladder.  Denies any neck or back pain.  No chest pain or shortness of breath.  No abdominal pain.  Denies any other injury. Patient has a history of aortic stenosis status post replacement, CHF, AICD in place. Denies any preceding dizziness, lightheadedness, chest pain or shortness of breath.  No syncope He states he is up-to-date on his tetanus shot  The history is provided by the patient and the EMS personnel.       Past Medical History:  Diagnosis Date  . Anxiety   . Aortic stenosis    s/p Bentall with bioprosthetic AVR 02/2010; Last echo (9/11): Moderate LVH, EF 45-50%, AVR functioning appropriately, aortic valve mean gradient 21, diastolic dysfunction. Chest MRA (2/13): Mild to moderate dilatation at the sinus of Valsalva at 4.1 cm, mild dilatation ascending aorta distal to the tube graft at 3.9 cm, moderate dilatation of the innominate artery a 2.1 cm;    . CHF (congestive heart failure) ([place])   . Depression   . ESRD on dialysis [name])    a. 09/2013 felt to be related to gentamycin b. no longer requiring HD  . Hx of cardiac cath    a. LHC in 02/2010: normal cors  . Hx of echocardiogram 02/2014   Echo (8/15):  Mod LV, EF 35-40%, Gr 1 DD, AVR ok (mean 14 mmHg), mild LAE  .  Hypertension   . Hypothyroidism, postsurgical   . Prosthetic valve endocarditis ([place])   . Staphylococcus aureus bacteremia   . Thyroid cancer ([place])   . Ventricular tachycardia ([place])    a. s/p STJ ICD    Patient Active Problem List   Diagnosis Date Noted  . AICD (automatic cardioverter/defibrillator) present   . History of acute inferior wall MI 12/31/2014  . 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 CKD (chronic kidney disease), stage II   . H/O aortic valve repair- 1968 12/07/2013  . Non-ischemic cardiomyopathy- EF 20-25% - TTE 11/2013 12/07/2013  . Ventricular tachycardia   . Elevated troponin- chronic- normal coronaries 11/25/2013  . Chronic combined systolic and diastolic CHF (congestive heart failure) ([place]) 10/22/2013  . Major depressive disorder, recurrent, severe with psychotic features- hospitalized in Jan 2013 08/20/2011  . Generalized anxiety disorder 08/20/2011  . AVR- Bentall 2011, re-do March 2015 secondary to valve endocarditis 12/29/2010  . Hypertension  12/29/2010  . Hypothyroidism 03/05/2009    Past Surgical History:  Procedure Laterality Date  . AORTIC VALVE REPAIR  1968  . AORTIC VALVE REPLACEMENT  2011  . AV FISTULA PLACEMENT Left 10/08/2013   Procedure: ARTERIOVENOUS (AV) FISTULA CREATION- LEFT ARM; Radial Cephalic ;  Surgeon:  Mal [name], MD;  Location: [place];  Service: Vascular;  Laterality: Left;  . BENTALL PROCEDURE N/A 10/28/2013   Procedure: REDO BENTALL PROCEDURE, debridment of aoritc root abscess, replacement of aortic root, ascending aorta and aortic valve with homograft. Insertion of left femoral arterial line;  Surgeon: [name], MD;  Location: [place];  Service: Open Heart Surgery;  Laterality: N/A;  . BIOPSY  07/22/2019   Procedure: BIOPSY;  Surgeon: Doran Stabler, MD;  [facility];  Service: Gastroenterology;;  . CARDIAC CATHETERIZATION  03/02/2010   NORMAL CORONARY ARTERY  . CARDIAC CATHETERIZATION N/A 01/01/2015   Procedure:  Coronary/Graft Angiography;  Surgeon: Troy Sine, MD;  Location: [place] CV LAB;  Service: Cardiovascular;  Laterality: N/A;  . CARDIAC VALVE REPLACEMENT    . COLONOSCOPY WITH PROPOFOL N/A 07/22/2019   Procedure: COLONOSCOPY WITH PROPOFOL;  Surgeon: Doran Stabler, MD;  [facility];  Service: Gastroenterology;  Laterality: N/A;  . EP IMPLANTABLE DEVICE N/A 01/01/2015   Procedure: ICD Implant;  Surgeon: Deboraha Sprang, MD;  Location: [place] CV LAB;  Service: Cardiovascular;  Laterality: N/A;  . HEMOSTASIS CLIP PLACEMENT  07/22/2019   Procedure: HEMOSTASIS CLIP PLACEMENT;  Surgeon: Doran Stabler, MD;  [facility];  Service: Gastroenterology;;  . INTRAOPERATIVE TRANSESOPHAGEAL ECHOCARDIOGRAM N/A 10/28/2013   Procedure: INTRAOPERATIVE TRANSESOPHAGEAL ECHOCARDIOGRAM;  Surgeon: [name], MD;  Location: [place];  Service: Open Heart Surgery;  Laterality: N/A;  . PERIPHERAL VASCULAR CATHETERIZATION N/A 01/01/2015   Procedure: Aortic Arch Angiography;  Surgeon: Troy Sine, MD;  Location: [place] CV LAB;  Service: Cardiovascular;  Laterality: N/A;  . POLYPECTOMY  07/22/2019   Procedure: POLYPECTOMY;  Surgeon: Doran Stabler, MD;  [facility];  Service: Gastroenterology;;  . SHOULDER ARTHROSCOPY W/ ROTATOR CUFF REPAIR Right 2012  . STERNOTOMY     REDO  . TEE WITHOUT CARDIOVERSION N/A 10/24/2013   Procedure: TRANSESOPHAGEAL ECHOCARDIOGRAM (TEE);  Surgeon: Dorothy Spark, MD;  Location: [facility] ENDOSCOPY;  Service: Cardiovascular;  Laterality: N/A;  . THYROIDECTOMY  ~ 2005  . TRANSTHORACIC ECHOCARDIOGRAM  03/2010   SHOWED MILD REDUCTION OF LV FUNCTION       Family History  Problem Relation Age of Onset  . Hypertension Mother   . Heart disease Father   . Stroke Father   . Hypertension Father   . Thyroid cancer Sister   . Thyroid cancer Sister   . Kidney Stones Sister   . Heart attack Neg Hx   . Colon cancer Neg Hx   . Colon  polyps Neg Hx   . Esophageal cancer Neg Hx   . Stomach cancer Neg Hx   . Rectal cancer Neg Hx     Social History   Tobacco Use  . Smoking status: Never Smoker  . Smokeless tobacco: Never Used  Substance Use Topics  . Alcohol use: No    Alcohol/week: 0.0 standard drinks    Comment: Occasional  . Drug use: No    Home Medications Prior to Admission medications   Medication Sig Start Date End Date Taking? Authorizing Provider  acetaminophen (TYLENOL) 325 MG tablet Take 325 mg by mouth 2 (two) times daily as needed for moderate pain or headache.    [provider]  aspirin EC 81 MG tablet Take 81 mg by mouth at bedtime.     [provider]  atorvastatin (LIPITOR) 40 MG tablet TAKE 1 TABLET BY MOUTH EVERY DAY  08/29/19   Burchette, Alinda Sierras, MD  carvedilol (COREG) 6.25 MG tablet TAKE 1 TABLET (6.25 MG TOTAL) BY MOUTH 2 (TWO) TIMES DAILY WITH A MEAL. 09/01/19   Bensimhon, Shaune Pascal, MD  clonazePAM (KLONOPIN) 1 MG tablet TAKE 1 TABLET BY MOUTH TWICE A DAY AS NEEDED *NOT FOR REGULAR USE* Patient taking differently: Take 0.5 mg by mouth at bedtime.  07/15/19   Burchette, Alinda Sierras, MD  digoxin (LANOXIN) 0.125 MG tablet TAKE 1 TABLET BY MOUTH EVERY DAY 07/14/19   Larey Dresser, MD  docusate sodium (STOOL SOFTENER) 100 MG capsule Take 100 mg by mouth 2 (two) times daily.    [provider]  Ensure (ENSURE) Take 237 mLs by mouth daily as needed. Patient taking differently: Take 237 mLs by mouth at bedtime.  04/25/19   Burchette, Alinda Sierras, MD  ENTRESTO 97-103 MG TAKE 1 TABLET BY MOUTH TWICE A DAY 08/11/19   Bensimhon, Shaune Pascal, MD  FARXIGA 10 MG TABS tablet TAKE 1 TABLET BY MOUTH DAILY BEFORE BREAKFAST. 10/22/19   Bensimhon, Shaune Pascal, MD  furosemide (LASIX) 20 MG tablet TAKE 1 TABLET (20 MG TOTAL) BY MOUTH 4 (FOUR) TIMES A WEEK. Patient taking differently: Take 20 mg by mouth 2 (two) times a week.  05/24/19   Deboraha Sprang, MD  levothyroxine (SYNTHROID) 175 MCG tablet TAKE 1  TABLET (175 MCG TOTAL) BY MOUTH DAILY BEFORE BREAKFAST. 04/22/19   Fay Records, MD  mexiletine (MEXITIL) 150 MG capsule Take 2 capsules (300 mg total) by mouth 2 (two) times daily. 10/06/19   [name], PA-C  ranolazine (RANEXA) 500 MG 12 hr tablet TAKE 1 TABLET BY MOUTH TWICE A DAY **NEED OFFICE VISIT FOR FUTURE REFILLS** 10/06/19   Fay Records, MD  Salicylic Acid (WART REMOVER EX) Apply 1 application topically daily as needed (foot corns).    [provider]  sertraline (ZOLOFT) 50 MG tablet TAKE 1 TABLET BY MOUTH EVERY DAY 09/01/19   Burchette, Alinda Sierras, MD    Allergies    Oxycodone and Rifampin  Review of Systems   Review of Systems  Constitutional: Negative for activity change, appetite change and fever.  HENT: Negative for congestion and rhinorrhea.   Respiratory: Negative for cough, chest tightness and shortness of breath.   Cardiovascular: Negative for chest pain.  Gastrointestinal: Negative for abdominal pain, nausea and vomiting.  Genitourinary: Negative for dysuria.  Musculoskeletal: Negative for arthralgias and myalgias.  Skin: Positive for wound.  Neurological: Positive for headaches. Negative for dizziness and weakness.    all other systems are negative except as noted in the HPI and PMH.   Physical Exam Updated Vital Signs BP 131/76 (BP Location: Left Arm)   Pulse 75   Temp 98.1 F (36.7 C) (Oral)   Resp 18   SpO2 100%   Physical Exam Vitals and nursing note reviewed.  Constitutional:      General: He is not in acute distress.    Appearance: He is well-developed.  HENT:     Head: Normocephalic.     Comments: 2 cm laceration to left temporal scalp with hematoma.  Bleeding controlled.    Mouth/Throat:     Pharynx: No oropharyngeal exudate.  Eyes:     Conjunctiva/sclera: Conjunctivae normal.     Pupils: Pupils are equal, round, and reactive to light.  Neck:     Comments: No midline C-spine tenderness. Cardiovascular:     Rate and Rhythm:  Normal rate and regular rhythm.  Heart sounds: Murmur present.  Pulmonary:     Effort: Pulmonary effort is normal. No respiratory distress.     Breath sounds: Normal breath sounds.  Abdominal:     Palpations: Abdomen is soft.     Tenderness: There is no abdominal tenderness. There is no guarding or rebound.  Musculoskeletal:        General: No tenderness. Normal range of motion.     Cervical back: Neck supple.     Comments: No T or L spine tenderness. FROM hips without pain.  Abrasion to dorsal PIP of third digit, no bony tenderness, range of motion intact  Skin:    General: Skin is warm.  Neurological:     General: No focal deficit present.     Mental Status: He is alert and oriented to person, place, and time.     Cranial Nerves: No cranial nerve deficit.     Motor: No abnormal muscle tone.     Coordination: Coordination normal.     Comments: No ataxia on finger to nose bilaterally. No pronator drift. 5/5 strength throughout. CN 2-12 intact.Equal grip strength. Sensation intact.   Psychiatric:        Behavior: Behavior normal.     ED Results / Procedures / Treatments   Labs (all labs ordered are listed, but only abnormal results are displayed) Labs Reviewed - No data to display  EKG EKG Interpretation  Date/Time:  Friday November 14 2019 23:58:55 EDT Ventricular Rate:  70 PR Interval:    QRS Duration: 121 QT Interval:  366 QTC Calculation: 395 R Axis:   81 Text Interpretation: Sinus rhythm Nonspecific intraventricular conduction delay Repol abnrm suggests ischemia, lateral leads No significant change was found Confirmed by Ezequiel Essex [phone]) on 11/15/2019 12:06:29 AM   Radiology CT Head Wo Contrast  Result Date: 11/15/2019 CLINICAL DATA:  Head trauma. EXAM: CT HEAD WITHOUT CONTRAST TECHNIQUE: Contiguous axial images were obtained from the base of the skull through the vertex without intravenous contrast. COMPARISON:  None. FINDINGS: Brain: There is no mass,  hemorrhage or extra-axial collection. The size and configuration of the ventricles and extra-axial CSF spaces are normal. There is hypoattenuation of the white matter, most commonly indicating chronic small vessel disease. Old left parietal infarct. Vascular: Small left frontal scalp hematoma. No skull fracture. Skull: The visualized skull base, calvarium and extracranial soft tissues are normal. Sinuses/Orbits: No fluid levels or advanced mucosal thickening of the visualized paranasal sinuses. No mastoid or middle ear effusion. The orbits are normal. IMPRESSION: 1. No acute intracranial abnormality. 2. Small left frontal scalp hematoma without skull fracture. 3. Chronic small vessel disease and old left parietal infarct. Electronically Signed   By: Ulyses Jarred M.D.   On: 11/15/2019 00:47   DG Hand Complete Right  Result Date: 11/15/2019 CLINICAL DATA:  Third digit abrasion EXAM: RIGHT HAND - COMPLETE 3+ VIEW COMPARISON:  None. FINDINGS: Frontal, oblique, lateral views of the right hand are obtained. No fracture, subluxation, or dislocation. Joint space narrowing and osteophyte formation is seen throughout the distal interphalangeal joints, greatest at the third and fifth digits. Soft tissues are unremarkable. No radiopaque foreign bodies. IMPRESSION: 1. Osteoarthritis.  No acute fracture. Electronically Signed   By: Randa Ngo M.D.   On: 11/15/2019 00:37    Procedures .Marland KitchenLaceration Repair  Date/Time: 11/15/2019 1:18 AM Performed by: Ezequiel Essex, MD Authorized by: Ezequiel Essex, MD   Consent:    Consent obtained:  Verbal   Consent given by:  Patient  Risks discussed:  Infection, need for additional repair, nerve damage, pain, poor cosmetic result, poor wound healing, vascular damage, tendon damage and retained foreign body   Alternatives discussed:  No treatment Anesthesia (see MAR for exact dosages):    Anesthesia method:  Local infiltration   Local anesthetic:  Lidocaine 2% WITH  epi Laceration details:    Location:  Scalp   Scalp location:  Frontal   Length (cm):  3 Repair type:    Repair type:  Simple Pre-procedure details:    Preparation:  Patient was prepped and draped in usual sterile fashion and imaging obtained to evaluate for foreign bodies Exploration:    Hemostasis achieved with:  Direct pressure and epinephrine   Wound exploration: wound explored through full range of motion and entire depth of wound probed and visualized     Wound extent: no fascia violation noted, no muscle damage noted, no underlying fracture noted and no vascular damage noted     Contaminated: no   Treatment:    Area cleansed with:  Betadine   Amount of cleaning:  Standard   Irrigation solution:  Sterile saline   Visualized foreign bodies/material removed: no   Skin repair:    Repair method:  Sutures   Suture size:  5-0   Suture material:  Nylon   Suture technique:  Simple interrupted   Number of sutures:  3 Approximation:    Approximation:  Close Post-procedure details:    Dressing:  Antibiotic ointment   Patient tolerance of procedure:  Tolerated well, no immediate complications   (including critical care time)  Medications Ordered in ED Medications  lidocaine-EPINEPHrine (XYLOCAINE W/EPI) 2 %-1:200000 (PF) injection 20 mL (has no administration in time range)    ED Course  I have reviewed the triage vital signs and the nursing notes.  Pertinent labs & imaging results that were available during my care of the patient were reviewed by me and considered in my medical decision making (see chart for details).    MDM Rules/Calculators/A&P                     Patient with head injury and laceration.  Denies losing consciousness.  Takes aspirin no other blood thinners  CT head is negative for acute traumatic pathology.  Laceration repaired as above.  Tetanus is up-to-date.  Patient ambulatory and tolerating p.o.  Follow-up with his PCP for suture removal in 1  week. Return precautions discussed  Final Clinical Impression(s) / ED Diagnoses Final diagnoses:  Injury of head, initial encounter  Laceration of forehead, initial encounter    Rx / DC Orders ED Discharge Orders    None       Mayra Brahm, Annie Main, MD 11/15/19 [phone]

## 2019-11-15 NOTE — ED Notes (Signed)
ED Provider at bedside for suture

## 2019-11-15 NOTE — ED Notes (Signed)
Patient transported to CT 

## 2019-11-15 NOTE — ED Triage Notes (Signed)
Pt brought in by EMS for laceration to head (left frontal), no active bleeding at this time. Pt has raised goose egg to this area. Pt is alert and O x 4. Pt denies pain. Denies LOC. Denies dizziness, chest pain, or any other symptoms.

## 2019-11-21 ENCOUNTER — Other Ambulatory Visit: Payer: Self-pay

## 2019-11-21 ENCOUNTER — Ambulatory Visit (INDEPENDENT_AMBULATORY_CARE_PROVIDER_SITE_OTHER): Payer: Medicare HMO | Admitting: Family Medicine

## 2019-11-21 ENCOUNTER — Encounter: Payer: Self-pay | Admitting: Family Medicine

## 2019-11-21 VITALS — BP 124/66 | HR 65 | Temp 97.7°F | Wt 178.6 lb

## 2019-11-21 DIAGNOSIS — I509 Heart failure, unspecified: Secondary | ICD-10-CM | POA: Diagnosis not present

## 2019-11-21 DIAGNOSIS — R451 Restlessness and agitation: Secondary | ICD-10-CM | POA: Diagnosis not present

## 2019-11-21 DIAGNOSIS — F339 Major depressive disorder, recurrent, unspecified: Secondary | ICD-10-CM | POA: Diagnosis not present

## 2019-11-21 DIAGNOSIS — F39 Unspecified mood [affective] disorder: Secondary | ICD-10-CM

## 2019-11-21 DIAGNOSIS — S0181XD Laceration without foreign body of other part of head, subsequent encounter: Secondary | ICD-10-CM | POA: Diagnosis not present

## 2019-11-21 MED ORDER — ARIPIPRAZOLE 10 MG PO TABS: 10.0000 mg | ORAL_TABLET | Freq: Every day | ORAL | 5 refills | Status: DC

## 2019-11-21 NOTE — Progress Notes (Signed)
Subjective:     Patient ID: Bradley [name], male   DOB: 1956-01-15, 64 y.o.   MRN: [id number]  HPI  Bradley [name] has multiple chronic medical problems including combined systolic and diastolic heart failure, hypertension, history of ventricular tachycardia, hypothyroidism, chronic kidney disease, history of aortic valve repair, history of acute inferior wall MI.  Has had history of recurrent depression.  Seen today for the following issues  Recent head injury.  He was reaching up and toolbox fell off and struck left temple region.  There was no loss of conscious.  Laceration left temple region.  He went to [facility] and had CT head which showed no intracerebral bleed.  He had 3 sutures placed.  Has had no headaches since then.  No dizziness.  No confusion.  Needs suture removal.  Injury occurred about 1 week ago  He is here accompanied by wife.  He has long history of recurrent depression.  Is currently on Zoloft.  He feels he had some increase in depression symptoms.  Wife relates that he frequently gets agitated intermittently.  He has had stretches where he sometimes does not sleep as much.  No delusional behavior.  Very strong family history of reported bipolar and 2 sisters and father.  He is followed through congestive heart failure clinic.  He initially thought there may be some side effects from [place] which he was recently started on for heart failure.  We explained that we did not think his mood disturbance was likely related.  They relate having a nephew currently staying with him and that has created some stress.  Wife states he has very difficult time sleeping and frequently gets agitated.  There is no suicidal ideation.  Past Medical History:  Diagnosis Date  . Anxiety   . Aortic stenosis    s/p Bentall with bioprosthetic AVR 02/2010; Last echo (9/11): Moderate LVH, EF 45-50%, AVR functioning appropriately, aortic valve mean gradient 21, diastolic dysfunction. Chest MRA  (2/13): Mild to moderate dilatation at the sinus of Valsalva at 4.1 cm, mild dilatation ascending aorta distal to the tube graft at 3.9 cm, moderate dilatation of the innominate artery a 2.1 cm;    . CHF (congestive heart failure) ([place])   . Depression   . ESRD on dialysis [facility])    a. 09/2013 felt to be related to gentamycin b. no longer requiring HD  . Hx of cardiac cath    a. LHC in 02/2010: normal cors  . Hx of echocardiogram 02/2014   Echo (8/15):  Mod LV, EF 35-40%, Gr 1 DD, AVR ok (mean 14 mmHg), mild LAE  . Hypertension   . Hypothyroidism, postsurgical   . Prosthetic valve endocarditis ([place])   . Staphylococcus aureus bacteremia   . Thyroid cancer ([place])   . Ventricular tachycardia ([place])    a. s/p STJ ICD   Past Surgical History:  Procedure Laterality Date  . AORTIC VALVE REPAIR  1968  . AORTIC VALVE REPLACEMENT  2011  . AV FISTULA PLACEMENT Left 10/08/2013   Procedure: ARTERIOVENOUS (AV) FISTULA CREATION- LEFT ARM; Radial Cephalic ;  Surgeon: Mal [name], MD;  Location: [place];  Service: Vascular;  Laterality: Left;  . BENTALL PROCEDURE N/A 10/28/2013   Procedure: REDO BENTALL PROCEDURE, debridment of aoritc root abscess, replacement of aortic root, ascending aorta and aortic valve with homograft. Insertion of left femoral arterial line;  Surgeon: [name], MD;  Location: [place];  Service: Open Heart Surgery;  Laterality: N/A;  .  BIOPSY  07/22/2019   Procedure: BIOPSY;  Surgeon: Doran Stabler, MD;  Location: [facility];  Service: Gastroenterology;;  . CARDIAC CATHETERIZATION  03/02/2010   NORMAL CORONARY ARTERY  . CARDIAC CATHETERIZATION N/A 01/01/2015   Procedure: Coronary/Graft Angiography;  Surgeon: Troy Sine, MD;  Location: [place] CV LAB;  Service: Cardiovascular;  Laterality: N/A;  . CARDIAC VALVE REPLACEMENT    . COLONOSCOPY WITH PROPOFOL N/A 07/22/2019   Procedure: COLONOSCOPY WITH PROPOFOL;  Surgeon: Doran Stabler, MD;  [facility];   Service: Gastroenterology;  Laterality: N/A;  . EP IMPLANTABLE DEVICE N/A 01/01/2015   Procedure: ICD Implant;  Surgeon: Deboraha Sprang, MD;  Location: [place] CV LAB;  Service: Cardiovascular;  Laterality: N/A;  . HEMOSTASIS CLIP PLACEMENT  07/22/2019   Procedure: HEMOSTASIS CLIP PLACEMENT;  Surgeon: Doran Stabler, MD;  [facility];  Service: Gastroenterology;;  . INTRAOPERATIVE TRANSESOPHAGEAL ECHOCARDIOGRAM N/A 10/28/2013   Procedure: INTRAOPERATIVE TRANSESOPHAGEAL ECHOCARDIOGRAM;  Surgeon: [name], MD;  Location: [place];  Service: Open Heart Surgery;  Laterality: N/A;  . PERIPHERAL VASCULAR CATHETERIZATION N/A 01/01/2015   Procedure: Aortic Arch Angiography;  Surgeon: Troy Sine, MD;  Location: [place] CV LAB;  Service: Cardiovascular;  Laterality: N/A;  . POLYPECTOMY  07/22/2019   Procedure: POLYPECTOMY;  Surgeon: Doran Stabler, MD;  [facility];  Service: Gastroenterology;;  . SHOULDER ARTHROSCOPY W/ ROTATOR CUFF REPAIR Right 2012  . STERNOTOMY     REDO  . TEE WITHOUT CARDIOVERSION N/A 10/24/2013   Procedure: TRANSESOPHAGEAL ECHOCARDIOGRAM (TEE);  Surgeon: Dorothy Spark, MD;  Location: [facility] ENDOSCOPY;  Service: Cardiovascular;  Laterality: N/A;  . THYROIDECTOMY  ~ 2005  . TRANSTHORACIC ECHOCARDIOGRAM  03/2010   SHOWED MILD REDUCTION OF LV FUNCTION    reports that he has never smoked. He has never used smokeless tobacco. He reports that he does not drink alcohol or use drugs. family history includes Heart disease in his father; Hypertension in his father and mother; Kidney Stones in his sister; Stroke in his father; Thyroid cancer in his sister and sister. Allergies  Allergen Reactions  . Oxycodone Other (See Comments)    Gives patient nightmares  . Rifampin Nausea Only     Review of Systems  Constitutional: Positive for fatigue. Negative for appetite change, chills and fever.  Respiratory: Negative for cough.   Cardiovascular:  Negative for chest pain.  Psychiatric/Behavioral: Positive for agitation, dysphoric mood and sleep disturbance. Negative for confusion, hallucinations, self-injury and suicidal ideas. The patient is nervous/anxious.        Objective:   Physical Exam Vitals reviewed.  Constitutional:      Appearance: Normal appearance.  HENT:     Head:     Comments: He has crusted area of wound left temporal region.  He has 3 sutures which are somewhat buried in the eschar.  No surrounding erythema Cardiovascular:     Rate and Rhythm: Normal rate.  Pulmonary:     Effort: Pulmonary effort is normal.     Breath sounds: Normal breath sounds.  Neurological:     General: No focal deficit present.     Mental Status: He is alert.     Cranial Nerves: No cranial nerve deficit.  Psychiatric:     Comments: PHQ-9 score 24 Mood disorder questionnaire 11/13.  Positive family history of bipolar and multiple members as above        Assessment:     #1 recent head  injury with laceration left temporal region.  Wound appears to be healing without secondary infection.  Needs suture removal.  CT head unremarkable.  No worrisome signs or symptoms at this time  #2 longstanding history of recurrent depression.  Has had some intermittent agitation.  Mood disorder questionnaire and history highly suggest that he has bipolar disorder.  He is currently stable.    Plan:     -3 sutures removed from left side of face without difficulty  -Keep wound clean with soap and water and follow-up promptly for signs of secondary infection  -Strongly advocate mood stabilizer.  He has had some concomitant depression and sleep difficulties and we recommended trial of Abilify 10 mg nightly and follow-up in 3 weeks and consider further titration at that point 15 mg if tolerating well  [name] MD [facility]

## 2019-11-21 NOTE — Patient Instructions (Signed)
Mixed Bipolar Disorder Mixed bipolar disorder is a mental health disorder in which a person has episodes of emotional highs (mania), lows (depression), or both of these feelings at the same time. People with this disorder have very big mood changes (mood swings) that happen quickly on a regular basis. These episodes may be severe enough to cause problems with relationships, school, or work. In some cases, they can cause the person to be unsafe, and the person may need to be hospitalized. What are the causes? The cause of this condition is not known. What increases the risk? The following factors may make you more likely to develop this condition:  Having a family history of the disorder.  Abusing substances such as alcohol or drugs.  Having an anxiety disorder.  Having another illness, such as heart disease or thyroid disease. What are the signs or symptoms? Symptoms of this condition include having episodes of mania, depression, and sometimes symptoms of both at the same time. For instance, you may feel sad and full of energy at the same time. You may have mood swings almost every day. Symptoms of mania may include:  Very high self-esteem or self-confidence.  Being unusually talkative, or feeling a need to keep talking. Speech may be very fast. It may seem like you cannot stop talking.  Racing thoughts or constant talking, with quick shifts between topics that may or may not be related (flight of ideas).  Decreased ability to focus or concentrate.  Increased purposeful activity, such as work, study, or social activity.  Increased nonproductive activity. This could be pacing, squirming and fidgeting, or finger and toe tapping.  Impulsive behavior and poor judgment. This may result in high-risk activities, such as having unprotected sex or spending a lot of money. Symptoms of depression may include:  Feeling sad, hopeless, or helpless.  Lack of feeling or caring about  anything.  Not being able to enjoy things that you used to enjoy.  Trouble concentrating or remembering.  Trouble making decisions.  Thoughts of death, or desire to harm yourself. How is this diagnosed? This condition may be diagnosed based on:  Your symptoms and medical history. Your health care provider will ask about your emotional episodes.  A physical exam. This is done to rule out any health problems that may be causing symptoms. Your health care provider will also ask about your alcohol and drug use. How is this treated? Bipolar disorder is a long-term (chronic) illness. It is best controlled with treatment that is given on an ongoing basis, rather than only when symptoms are present. Treatment may include:  Medicine. Medicine can be prescribed by a provider who specializes in treating mental disorders (psychiatrist). ? Medicines called mood stabilizers, antipsychotics, or antidepressants may be prescribed. ? If symptoms occur even while one type of medicine is taken, other medicines may be added.  Psychotherapy. Some forms of talk therapy, such as cognitive behavioral therapy (CBT), can provide support, education, and guidance.  Coping methods, such as journaling or relaxation exercises. These may include: ? Yoga. ? Meditation. ? Deep breathing.  Lifestyle changes, such as: ? Limiting alcohol and drug use. ? Exercising regularly. ? Getting plenty of sleep. ? Making healthy eating choices. A combination of medicine, talk therapy, and coping methods is best. In severe cases, if other treatments do not work, a procedure may be used to change the brain chemicals that send messages between brain cells (neurotransmitters). This procedure, called electroconvulsive therapy (ECT), applies short electrical pulses to the  brain through the scalp. Follow these instructions at home: Activity   Return to your normal activities as told by your health care provider.  Find activities  that you enjoy, and make time to do them.  Exercise regularly as told by your health care provider. Lifestyle  Follow a set schedule for eating and sleeping.  Eat a balanced diet that includes fresh fruits and vegetables, whole grains, low-fat dairy products, and lean meats.  Get 7-8 or more hours of sleep each night.  Do not drink alcohol or use illegal drugs. General instructions  Take over-the-counter and prescription medicines only as told by your health care provider.  Think about joining a support group. Your health care provider may be able to recommend a group for you.  Talk with your family and loved ones about your treatment goals and about how they can help.  Keep all follow-up visits as told by your health care provider. This is important. Contact a health care provider if:  Your symptoms get worse.  You have side effects from your medicine.  You have trouble sleeping.  You have trouble doing daily activities.  You feel unsafe in your surroundings.  You are dealing with substance abuse. Get help right away if:  You think about hurting yourself or you try to hurt yourself.  You think about suicide. If you ever feel like you may hurt yourself or others, or have thoughts about taking your own life, get help right away. You can go to your nearest emergency department or call:  Your local emergency services (911 in the [place]  A suicide crisis helpline, such as the [facility] at (782)325-2976. This is open 24 hours a day. Summary  Mixed bipolar disorder is a mental health disorder in which a person has episodes of emotional highs (mania), lows (depression), or both of these feelings at the same time.  Bipolar disorder is a long-term (chronic) illness. It is best controlled with treatment that is given on an ongoing basis, rather than only when symptoms are present.  A combination of medicine, talk therapy, and coping methods is best  for treating this condition. This information is not intended to replace advice given to you by your health care provider. Make sure you discuss any questions you have with your health care provider. Document Revised: 06/29/2017 Document Reviewed: 11/10/2016 Elsevier Patient Education  [place].

## 2019-11-24 ENCOUNTER — Ambulatory Visit (INDEPENDENT_AMBULATORY_CARE_PROVIDER_SITE_OTHER): Payer: Medicare HMO

## 2019-11-24 DIAGNOSIS — Z95810 Presence of automatic (implantable) cardiac defibrillator: Secondary | ICD-10-CM | POA: Diagnosis not present

## 2019-11-24 DIAGNOSIS — I5042 Chronic combined systolic (congestive) and diastolic (congestive) heart failure: Secondary | ICD-10-CM | POA: Diagnosis not present

## 2019-11-25 ENCOUNTER — Telehealth: Payer: Self-pay

## 2019-11-25 NOTE — Telephone Encounter (Signed)
Left message for patient to remind of missed remote transmission.  

## 2019-11-27 ENCOUNTER — Telehealth: Payer: Self-pay | Admitting: Family Medicine

## 2019-11-27 NOTE — Chronic Care Management (AMB) (Signed)
  Chronic Care Management   Note  11/27/2019 Name: Bradley [name] MRN: [id number] DOB: 1956-07-23  Bradley [name] is a 64 y.o. year old male who is a primary care patient of Burchette, Alinda Sierras, MD. I reached out to [name] by phone today in response to a referral sent by Bradley [name]'s PCP, [name], MD.   [name] was given information about Chronic Care Management services today including:  1. CCM service includes personalized support from designated clinical staff supervised by his physician, including individualized plan of care and coordination with other care providers 2. 24/7 contact phone numbers for assistance for urgent and routine care needs. 3. Service will only be billed when office clinical staff spend 20 minutes or more in a month to coordinate care. 4. Only one practitioner may furnish and bill the service in a calendar month. 5. The patient may stop CCM services at any time (effective at the end of the month) by phone call to the office staff.   Patient agreed to services and verbal consent obtained.   Follow up plan:   [place]

## 2019-12-01 NOTE — Progress Notes (Signed)
EPIC Encounter for ICM Monitoring  Patient Name: Bradley [name] is a 64 y.o. male Date: 12/01/2019 Primary Care Physican: [name], MD Heart Failure Clinic:[facility] Electrophysiologist: Caryl Comes Nephrologist:[place] 4/23/2021Office [id number]   Transmission reviewed.   CorVue thoracic impedancereturned to normal since 2/12 remote transmission.  Prescribed:Furosemide 20 mgtake1 tablettwice a week.   Labs: 06/11/2019 Creatinine 1.12, BUN 24, Potassium 4.7, Sodium 142, GFR 66.36 04/04/2019 Creatinine 1.01, BUN 18, Potassium 4.1, Sodium 142, GFR 79-92  Recommendations: None  Follow-up plan: ICM clinic phone appointment on6/08/2019. 91 day device clinic remote transmission 12/11/2019.   Copy of ICM check sent to [place].  3 month ICM trend: 11/27/2019    1 Year ICM trend:       [name], RN 12/01/2019 4:28 PM

## 2019-12-04 ENCOUNTER — Telehealth: Payer: Medicare HMO

## 2019-12-06 DIAGNOSIS — I5042 Chronic combined systolic (congestive) and diastolic (congestive) heart failure: Secondary | ICD-10-CM | POA: Diagnosis not present

## 2019-12-11 ENCOUNTER — Ambulatory Visit (INDEPENDENT_AMBULATORY_CARE_PROVIDER_SITE_OTHER): Payer: Medicare HMO | Admitting: *Deleted

## 2019-12-11 ENCOUNTER — Other Ambulatory Visit: Payer: Self-pay

## 2019-12-11 DIAGNOSIS — I428 Other cardiomyopathies: Secondary | ICD-10-CM

## 2019-12-11 DIAGNOSIS — I472 Ventricular tachycardia, unspecified: Secondary | ICD-10-CM

## 2019-12-12 ENCOUNTER — Telehealth: Payer: Self-pay

## 2019-12-12 ENCOUNTER — Ambulatory Visit (INDEPENDENT_AMBULATORY_CARE_PROVIDER_SITE_OTHER): Payer: Medicare HMO | Admitting: Family Medicine

## 2019-12-12 ENCOUNTER — Encounter: Payer: Self-pay | Admitting: Family Medicine

## 2019-12-12 VITALS — BP 114/64 | HR 62 | Temp 97.9°F | Wt 175.0 lb

## 2019-12-12 DIAGNOSIS — K4090 Unilateral inguinal hernia, without obstruction or gangrene, not specified as recurrent: Secondary | ICD-10-CM | POA: Diagnosis not present

## 2019-12-12 DIAGNOSIS — F339 Major depressive disorder, recurrent, unspecified: Secondary | ICD-10-CM | POA: Diagnosis not present

## 2019-12-12 LAB — CUP PACEART REMOTE DEVICE CHECK
Battery Remaining Longevity: 52 mo
Battery Remaining Percentage: 51 %
Battery Voltage: 2.9 V
Brady Statistic RV Percent Paced: 1 %
Date Time Interrogation Session: 20210514091419
HighPow Impedance: 75 Ohm
HighPow Impedance: 75 Ohm
Implantable Lead Implant Date: 20160603
Implantable Lead Location: 753860
Implantable Lead Model: 7122
Implantable Pulse Generator Implant Date: 20160603
Lead Channel Impedance Value: 380 Ohm
Lead Channel Pacing Threshold Amplitude: 1.25 V
Lead Channel Pacing Threshold Pulse Width: 0.5 ms
Lead Channel Sensing Intrinsic Amplitude: 11.7 mV
Lead Channel Setting Pacing Amplitude: 2.5 V
Lead Channel Setting Pacing Pulse Width: 0.5 ms
Lead Channel Setting Sensing Sensitivity: 0.5 mV
Pulse Gen Serial Number: 7135169

## 2019-12-12 MED ORDER — ARIPIPRAZOLE 15 MG PO TABS: 15.0000 mg | ORAL_TABLET | Freq: Every day | ORAL | 5 refills | Status: DC

## 2019-12-12 NOTE — Progress Notes (Addendum)
Subjective:     Patient ID: Bradley [name], male   DOB: 1956/03/16, 64 y.o.   MRN: [id number]  HPI Bradley [name] is seen for the following issues  New problem of right inguinal bulge over the past few years.  This has recently become progressively larger and sometimes uncomfortable especially with activity such as straining or lifting.  He has never been diagnosed with hernia.  He thought this may be somehow related to previous heart cath he had years ago.  He has recently been restricting activities because of the swelling and pain.  Second issue is mood disorder.  Refer to previous note for details.  He had admission back in 2013 for severe depression with psychotic features.  He has had a long history of recurrent depression.  Has been on Zoloft for quite some time.  Had recently had some increased depression symptoms.  Wife did mention some increased agitation.  He had periods of insomnia.  Strong family history of bipolar disorder.  He had recent PHQ-9 score of 24 and mood disorder questionnaire 11/13.  We initiated Abilify 10 mg daily.  Both patient and his wife think that he is improved in terms of better sleep and less depression and also somewhat less agitation though he still has some issues with both.  No psychosis.  No delusions.  No suicidal ideation  Past Medical History:  Diagnosis Date  . Anxiety   . Aortic stenosis    s/p Bentall with bioprosthetic AVR 02/2010; Last echo (9/11): Moderate LVH, EF 45-50%, AVR functioning appropriately, aortic valve mean gradient 21, diastolic dysfunction. Chest MRA (2/13): Mild to moderate dilatation at the sinus of Valsalva at 4.1 cm, mild dilatation ascending aorta distal to the tube graft at 3.9 cm, moderate dilatation of the innominate artery a 2.1 cm;    . CHF (congestive heart failure) ([place])   . Depression   . ESRD on dialysis [facility])    a. 09/2013 felt to be related to gentamycin b. no longer requiring HD  . Hx of cardiac cath    a. LHC in 02/2010:  normal cors  . Hx of echocardiogram 02/2014   Echo (8/15):  Mod LV, EF 35-40%, Gr 1 DD, AVR ok (mean 14 mmHg), mild LAE  . Hypertension   . Hypothyroidism, postsurgical   . Prosthetic valve endocarditis ([place])   . Staphylococcus aureus bacteremia   . Thyroid cancer ([place])   . Ventricular tachycardia ([place])    a. s/p STJ ICD   Past Surgical History:  Procedure Laterality Date  . AORTIC VALVE REPAIR  1968  . AORTIC VALVE REPLACEMENT  2011  . AV FISTULA PLACEMENT Left 10/08/2013   Procedure: ARTERIOVENOUS (AV) FISTULA CREATION- LEFT ARM; Radial Cephalic ;  Surgeon: Mal [name], MD;  Location: [facility];  Service: Vascular;  Laterality: Left;  . BENTALL PROCEDURE N/A 10/28/2013   Procedure: REDO BENTALL PROCEDURE, debridment of aoritc root abscess, replacement of aortic root, ascending aorta and aortic valve with homograft. Insertion of left femoral arterial line;  Surgeon: [name], MD;  Location: [facility];  Service: Open Heart Surgery;  Laterality: N/A;  . BIOPSY  07/22/2019   Procedure: BIOPSY;  Surgeon: Doran Stabler, MD;  Location: [facility];  Service: Gastroenterology;;  . CARDIAC CATHETERIZATION  03/02/2010   NORMAL CORONARY ARTERY  . CARDIAC CATHETERIZATION N/A 01/01/2015   Procedure: Coronary/Graft Angiography;  Surgeon: Troy Sine, MD;  Location: [facility] CV [facility];  Service: Cardiovascular;  Laterality: N/A;  .  CARDIAC VALVE REPLACEMENT    . COLONOSCOPY WITH PROPOFOL N/A 07/22/2019   Procedure: COLONOSCOPY WITH PROPOFOL;  Surgeon: Doran Stabler, MD;  Location: [facility];  Service: Gastroenterology;  Laterality: N/A;  . EP IMPLANTABLE DEVICE N/A 01/01/2015   Procedure: ICD Implant;  Surgeon: Deboraha Sprang, MD;  Location: [facility];  Service: Cardiovascular;  Laterality: N/A;  . HEMOSTASIS CLIP PLACEMENT  07/22/2019   Procedure: HEMOSTASIS CLIP PLACEMENT;  Surgeon: Doran Stabler, MD;  Location: [facility];  Service: Gastroenterology;;  .  INTRAOPERATIVE TRANSESOPHAGEAL ECHOCARDIOGRAM N/A 10/28/2013   Procedure: INTRAOPERATIVE TRANSESOPHAGEAL ECHOCARDIOGRAM;  Surgeon: [name], MD;  Location: [facility];  Service: Open Heart Surgery;  Laterality: N/A;  . PERIPHERAL VASCULAR CATHETERIZATION N/A 01/01/2015   Procedure: Aortic Arch Angiography;  Surgeon: Troy Sine, MD;  Location: [facility];  Service: Cardiovascular;  Laterality: N/A;  . POLYPECTOMY  07/22/2019   Procedure: POLYPECTOMY;  Surgeon: Doran Stabler, MD;  Location: [facility];  Service: Gastroenterology;;  . SHOULDER ARTHROSCOPY W/ ROTATOR CUFF REPAIR Right 2012  . STERNOTOMY     REDO  . TEE WITHOUT CARDIOVERSION N/A 10/24/2013   Procedure: TRANSESOPHAGEAL ECHOCARDIOGRAM (TEE);  Surgeon: Dorothy Spark, MD;  [facility] ENDOSCOPY;  Service: Cardiovascular;  Laterality: N/A;  . THYROIDECTOMY  ~ 2005  . TRANSTHORACIC ECHOCARDIOGRAM  03/2010   SHOWED MILD REDUCTION OF LV FUNCTION    reports that he has never smoked. He has never used smokeless tobacco. He reports that he does not drink alcohol or use drugs. family history includes Heart disease in his father; Hypertension in his father and mother; Kidney Stones in his sister; Stroke in his father; Thyroid cancer in his sister and sister. Allergies  Allergen Reactions  . Oxycodone Other (See Comments)    Gives patient nightmares  . Rifampin Nausea Only     Review of Systems  Constitutional: Negative for chills and fever.  Respiratory: Negative for shortness of breath.   Cardiovascular: Negative for chest pain.  Psychiatric/Behavioral: Positive for dysphoric mood and sleep disturbance. Negative for suicidal ideas. The patient is nervous/anxious.        Objective:   Physical Exam Vitals reviewed.  Constitutional:      Appearance: Normal appearance.  Cardiovascular:     Rate and Rhythm: Normal rate.  Pulmonary:     Effort: Pulmonary effort is normal.     Breath sounds: Normal breath  sounds.  Genitourinary:    Comments: He has right inguinal bulge which is soft and nontender Neurological:     Mental Status: He is alert.        Assessment:     #1 right inguinal hernia which is gradually increased in size and becoming more symptomatic over time  #2 history of recurrent depression.  He has had concerns for agitation at times and has seen some improvement with Abilify    Plan:     -Set up referral to general surgeon to consult regarding his hernia.  Patient wishes to have something done about this eventually because he is very active with things around the house.  Would recommend cardiology clearance for any elective surgeries  #2 titrate Abilify to 15 mg nightly.  Continue sertraline.  May use Klonopin at night for severe insomnia  -We recommended office follow-up with in 6 to 8 weeks to reassess  [name] MD [facility]

## 2019-12-12 NOTE — Progress Notes (Signed)
Remote ICD transmission.   

## 2019-12-12 NOTE — Patient Instructions (Signed)
Inguinal Hernia, Adult An inguinal hernia develops when fat or the intestines push through a weak spot in a muscle where your leg meets your lower abdomen (groin). This creates a bulge. This kind of hernia could also be:  In your scrotum, if you are male.  In folds of skin around your vagina, if you are male. There are three types of inguinal hernias:  Hernias that can be pushed back into the abdomen (are reducible). This type rarely causes pain.  Hernias that are not reducible (are incarcerated).  Hernias that are not reducible and lose their blood supply (are strangulated). This type of hernia requires emergency surgery. What are the causes? This condition is caused by having a weak spot in the muscles or tissues in the groin. This weak spot develops over time. The hernia may poke through the weak spot when you suddenly strain your lower abdominal muscles, such as when you:  Lift a heavy object.  Strain to have a bowel movement. Constipation can lead to straining.  Cough. What increases the risk? This condition is more likely to develop in:  Men.  Pregnant women.  People who: ? Are overweight. ? Work in jobs that require long periods of standing or heavy lifting. ? Have had an inguinal hernia before. ? Smoke or have lung disease. These factors can lead to long-lasting (chronic) coughing. What are the signs or symptoms? Symptoms may depend on the size of the hernia. Often, a small inguinal hernia has no symptoms. Symptoms of a larger hernia may include:  A lump in the groin area. This is easier to see when standing. It might not be visible when lying down.  Pain or burning in the groin. This may get worse when lifting, straining, or coughing.  A dull ache or a feeling of pressure in the groin.  In men, an unusual lump in the scrotum. Symptoms of a strangulated inguinal hernia may include:  A bulge in your groin that is very painful and tender to the touch.  A bulge  that turns red or purple.  Fever, nausea, and vomiting.  Inability to have a bowel movement or to pass gas. How is this diagnosed? This condition is diagnosed based on your symptoms, your medical history, and a physical exam. Your health care provider may feel your groin area and ask you to cough. How is this treated? Treatment depends on the size of your hernia and whether you have symptoms. If you do not have symptoms, your health care provider may have you watch your hernia carefully and have you come in for follow-up visits. If your hernia is large or if you have symptoms, you may need surgery to repair the hernia. Follow these instructions at home: Lifestyle  Avoid lifting heavy objects.  Avoid standing for long periods of time.  Do not use any products that contain nicotine or tobacco, such as cigarettes and e-cigarettes. If you need help quitting, ask your health care provider.  Maintain a healthy weight. Preventing constipation  Take actions to prevent constipation. Constipation leads to straining with bowel movements, which can make a hernia worse or cause a hernia repair to break down. Your health care provider may recommend that you: ? Drink enough fluid to keep your urine pale yellow. ? Eat foods that are high in fiber, such as fresh fruits and vegetables, whole grains, and beans. ? Limit foods that are high in fat and processed sugars, such as fried or sweet foods. ? Take an over-the-counter   or prescription medicine for constipation. General instructions  You may try to push the hernia back in place by very gently pressing on it while lying down. Do not try to force the bulge back in if it will not push in easily.  Watch your hernia for any changes in shape, size, or color. Get help right away if you notice any changes.  Take over-the-counter and prescription medicines only as told by your health care provider.  Keep all follow-up visits as told by your health care  provider. This is important. Contact a health care provider if:  You have a fever.  You develop new symptoms.  Your symptoms get worse. Get help right away if:  You have pain in your groin that suddenly gets worse.  You have a bulge in your groin that: ? Suddenly gets bigger and does not get smaller. ? Becomes red or purple or painful to the touch.  You are a man and you have a sudden pain in your scrotum, or the size of your scrotum suddenly changes.  You cannot push the hernia back in place by very gently pressing on it when you are lying down. Do not try to force the bulge back in if it will not push in easily.  You have nausea or vomiting that does not go away.  You have a fast heartbeat.  You cannot have a bowel movement or pass gas. These symptoms may represent a serious problem that is an emergency. Do not wait to see if the symptoms will go away. Get medical help right away. Call your local emergency services (911 in the [place] Summary  An inguinal hernia develops when fat or the intestines push through a weak spot in a muscle where your leg meets your lower abdomen (groin).  This condition is caused by having a weak spot in muscles or tissue in your groin.  Symptoms may depend on the size of the hernia, and they may include pain or swelling in your groin. A small inguinal hernia often has no symptoms.  Treatment may not be needed if you do not have symptoms. If you have symptoms or a large hernia, you may need surgery to repair the hernia.  Avoid lifting heavy objects. Also avoid standing for long amounts of time. This information is not intended to replace advice given to you by your health care provider. Make sure you discuss any questions you have with your health care provider. Document Revised: 08/18/2017 Document Reviewed: 04/18/2017 Elsevier Patient Education  2020 [employer]  

## 2019-12-12 NOTE — Telephone Encounter (Signed)
Spoke with patient to remind of missed remote transmission 

## 2019-12-22 ENCOUNTER — Other Ambulatory Visit: Payer: Self-pay

## 2019-12-22 DIAGNOSIS — E039 Hypothyroidism, unspecified: Secondary | ICD-10-CM

## 2019-12-22 DIAGNOSIS — I10 Essential (primary) hypertension: Secondary | ICD-10-CM

## 2019-12-22 NOTE — Telephone Encounter (Signed)
Referral has been placed. 

## 2019-12-24 ENCOUNTER — Other Ambulatory Visit: Payer: Self-pay

## 2019-12-24 ENCOUNTER — Ambulatory Visit: Payer: Medicare HMO

## 2019-12-24 DIAGNOSIS — I10 Essential (primary) hypertension: Secondary | ICD-10-CM

## 2019-12-24 DIAGNOSIS — I252 Old myocardial infarction: Secondary | ICD-10-CM

## 2019-12-24 DIAGNOSIS — I5042 Chronic combined systolic (congestive) and diastolic (congestive) heart failure: Secondary | ICD-10-CM

## 2019-12-24 DIAGNOSIS — E039 Hypothyroidism, unspecified: Secondary | ICD-10-CM

## 2019-12-24 DIAGNOSIS — F339 Major depressive disorder, recurrent, unspecified: Secondary | ICD-10-CM

## 2019-12-24 DIAGNOSIS — F39 Unspecified mood [affective] disorder: Secondary | ICD-10-CM

## 2019-12-24 NOTE — Chronic Care Management (AMB) (Signed)
Chronic Care Management Pharmacy  Name: Bradley [name]  MRN: [id number] DOB: 07/03/1956  Initial Questions: 1. Have you seen any other providers since your last visit? NA 2. Any changes in your medicines or health? No   Chief Complaint/ HPI  Bradley [name],  64 y.o. , male presents for their Initial CCM visit with the clinical pharmacist via telephone due to COVID-19 Pandemic.  PCP : [name], MD  Their chronic conditions include: Depression/ mood disorder, systolic and diastolic HF, Ventricular tachycardia, HTN, History of MI, Hypothyroidism, Pain, CKD stage II   Office Visits: 12/12/2019- [name], MD- Patient presented for office visit for right inguinal bulge and mood disorder. Patient referred to general surgeon for hernia. Recommend cardiology clearance for any elective surgeries. Abilify increased to 15mg  nightly and continue sertraline. Patient may use clonazepam for severe insomnia. Patient recommended office visit follow up in 6 to 8 weeks to reassess.   11/21/2019- [name], MD- patient presented for office visit for recent head injury needing suture removal. Patient reported increase in depression symptoms. 3 sutures were removed from left side of face. Recommended trial of Abilify 10mg  nightly and follow up in 3 weeks and consider further titration.   Consult Visit: 11/14/2019- ED visit- patient presented with head injury and laceration. CT negative for acute trauma pathology. Laceration repaired. Patient to follow up with PCP for suture removal in 1 week.   03/01/20210 Cardiology- [name], PA-  patient presented for office visit for follow up. Patient presented as stable. Patient not candidate for Barostim, and would be candiate for CRT. Patient to follow up as scheduled.   Medications: Outpatient Encounter Medications as of 12/24/2019  Medication Sig  . acetaminophen (TYLENOL) 325 MG tablet Take 325 mg by mouth 2 (two) times daily as  needed for moderate pain or headache.  . ARIPiprazole (ABILIFY) 15 MG tablet Take 1 tablet (15 mg total) by mouth daily.  Marland Kitchen aspirin EC 81 MG tablet Take 81 mg by mouth at bedtime.   Marland Kitchen atorvastatin (LIPITOR) 40 MG tablet TAKE 1 TABLET BY MOUTH EVERY DAY (Patient taking differently: every other day. )  . carvedilol (COREG) 6.25 MG tablet TAKE 1 TABLET (6.25 MG TOTAL) BY MOUTH 2 (TWO) TIMES DAILY WITH A MEAL.  . clonazePAM (KLONOPIN) 1 MG tablet TAKE 1 TABLET BY MOUTH TWICE A DAY AS NEEDED *NOT FOR REGULAR USE* (Patient taking differently: Take 0.5 mg by mouth at bedtime. )  . digoxin (LANOXIN) 0.125 MG tablet TAKE 1 TABLET BY MOUTH EVERY DAY  . docusate sodium (STOOL SOFTENER) 100 MG capsule Take 100 mg by mouth 2 (two) times daily.  . Ensure (ENSURE) Take 237 mLs by mouth daily as needed. (Patient taking differently: Take 237 mLs by mouth every other day. )  . ENTRESTO 97-103 MG TAKE 1 TABLET BY MOUTH TWICE A DAY  . FARXIGA 10 MG TABS tablet TAKE 1 TABLET BY MOUTH DAILY BEFORE BREAKFAST.  . furosemide (LASIX) 20 MG tablet TAKE 1 TABLET (20 MG TOTAL) BY MOUTH 4 (FOUR) TIMES A WEEK. (Patient taking differently: Take 20 mg by mouth 2 (two) times a week. )  . levothyroxine (SYNTHROID) 175 MCG tablet TAKE 1 TABLET (175 MCG TOTAL) BY MOUTH DAILY BEFORE BREAKFAST.  Marland Kitchen mexiletine (MEXITIL) 150 MG capsule Take 2 capsules (300 mg total) by mouth 2 (two) times daily.  . ranolazine (RANEXA) 500 MG 12 hr tablet TAKE 1 TABLET BY MOUTH TWICE A DAY **NEED OFFICE VISIT FOR  FUTURE REFILLS**  . Salicylic Acid (WART REMOVER EX) Apply 1 application topically daily as needed (foot corns).  . sertraline (ZOLOFT) 50 MG tablet TAKE 1 TABLET BY MOUTH EVERY DAY   No facility-administered encounter medications on file as of 12/24/2019.    Current Diagnosis/Assessment:  Goals Addressed            This Visit's Progress   . Pharmacy Care Plan       CARE PLAN ENTRY  Current Barriers:  . Chronic Disease Management  support, education, and care coordination needs related to Hypertension, Heart Failure, Hypothyroidism, Depression, and mood disorder, history of MI   Hypertension . Pharmacist Clinical Goal(s): o Over the next  days, patient will work with PharmD and providers to maintain BP goal <130/80 . Current regimen:  o Carvedilol 6.25mg , 1 tablet twice daily with a meal  . Patient self care activities - Over the next 180 days, patient will: o Check BP every other day, document, and provide at future appointments  History of MI  . Pharmacist Clinical Goal(s): o Over the next 180 days, patient will work with PharmD and providers to maintain LDL goal < 67 . Current regimen:  . Aspirin 81mg , 1 tablet once daily  . Atorvastatin 40mg , 1 tablet every day (takes every other day)  . Interventions: o Discussed taking medications as indicated and report use at next visit with providers.  . Patient self care activities - Over the next 180 days, patient will: o Continue current medications.   Heart failure . Pharmacist Clinical Goal(s) o Over the next 180 days, patient will work with PharmD and providers to monitor weight and report weight increase/ shortness of breath.  . Current regimen:   Carvedilol 6.25mg , 1 tablet twice daily with a meal   Digoxin 0.125mg , 1 tablet once daily   Entresto 97-103mg , 1 tablet twice daily   Furosemide 20mg , 1 tablet four times a week   Farxiga 10mg , 1 tablet once daily before breakfast  . Interventions: o We discussed weighing daily; if you gain more than 3 pounds in one day or 5 pounds in one week call your doctor  . Patient self care activities - Over the next 180 days, patient will: o Continue monitoring weight.   Hypothyroidism . Pharmacist Clinical Goal(s) o Over the next 180 days, patient will work with PharmD and providers to maintain TSH: 0.35 to 4.5 uIU/mL  . Current regimen:  o Levothyroxine 12mcg, 1 tablet once daily before breakfast  . Patient  self care activities o Patient will continue current medications.   Depression/ mood disorder  . Pharmacist Clinical Goal(s) o Over the next 180 days, patient will work with PharmD and providers to Improve mood/ depression symptoms. . Current regimen:  . Sertraline 50mg , 1 tablet once daily . Aripiprazole (Abilify) 15mg , 1 tablet once daily  . Clonazepam 1mg , 1 tablet twice daily as needed (will take about 3x per week) only when anxiety is high.  . Interventions: o Consulted with Dr. Elease Hashimoto in regards to reported blurry vision. Referral to ophthalmology.  . Patient self care activities o Patient will continue current medications as directed.   Medication management . Pharmacist Clinical Goal(s): o Over the next 180 days, patient will work with PharmD and providers to maintain optimal medication adherence . [facility] . Interventions o Comprehensive medication review performed. o Continue current medication management strategy . Patient self care activities - Over the next 180 days, patient will: o Take medications as prescribed  o Report any questions or concerns to PharmD and/or provider(s)  Initial goal documentation       SDOH Interventions     Most Recent Value  SDOH Interventions  Financial Strain Interventions  Intervention Not Indicated  Transportation Interventions  Intervention Not Indicated       Depression/ mood disorder  Patient reported having blurred vision since dose of Abilify was increased on 12/12/2019. Other than that, he states he is doing well.   Patient is currently controlled on the following medications:  . Sertraline 50mg , 1 tablet once daily . Aripiprazole (Abilify) 15mg , 1 tablet once daily  . Clonazepam 1mg , 1 tablet twice daily as needed (will take about 3x per week) only when anxiety is high.    Plan Recommend monitoring for lipid panel, fasting glucose/ A1c (3 months after starting Abilify)  Consulted with Dr. Elease Hashimoto in  regards to Abilify and blurred vision. Agreed to referral to ophthalmology.   Heart Failure   Type: Combined Systolic and Diastolic  Last ejection fraction: 30 to 35% (07/09/2019)   NYHA Class: II (slight limitation of activity)- only when humid and hot and going up stairs.   Digoxin: 0.8 (04/04/2019)   Patient has failed these meds in past: lisinopril, spironolatone (low BP and hyperkalemia)  Patient is currently controlled on the following medications:   Carvedilol 6.25mg , 1 tablet twice daily with a meal   Digoxin 0.125mg , 1 tablet once daily   Entresto 97-103mg , 1 tablet twice daily   Furosemide 20mg , 1 tablet four times a week (takes 2x or sometimes 3x per week)   Farxiga 10mg , 1 tablet once daily before breakfast   We discussed weighing daily; if you gain more than 3 pounds in one day or 5 pounds in one week call your doctor  - patient reports weight is stable at 176 lbs.     Plan Managed by cardiology.  Continue current medications   Ventricular tachycardia   Patient is currently controlled on the following medications:  . mexiletine 150mg , 2 capsule twice daily . Ranolazine 500mg , 1 tablet twice daily   Plan Managed by cardiology.  Continue current medications  Hypertension  Denies dizziness/lightheadedness. Endorses sx only when over exerting himself.   Office blood pressures are  BP Readings from Last 3 Encounters:  12/12/19 114/64  11/21/19 124/66  11/15/19 138/84   Patient checks BP at home every other day  Patient home BP readings are ranging: 109-117/ 60-68  Patient is controlled on:   Carvedilol 6.25mg , 1 tablet twice daily with a meal   Plan Continue current medications   History of MI    Lipid Panel     Component Value Date/Time   CHOL 132 06/11/2019 1040   CHOL 139 08/27/2017 1205   TRIG 77.0 06/11/2019 1040   HDL 49.80 06/11/2019 1040   HDL 60 08/27/2017 1205   [place] 67 06/11/2019 1040   [place] 70 08/27/2017 1205      The 10-year ASCVD risk score Mikey Bussing DC Jr., et al., 2013) is: 6.9%   Values used to calculate the score:     Age: 31 years     Sex: Male     Is Non-Hispanic African American: No     Diabetic: No     Tobacco smoker: No     Systolic Blood Pressure: 220 mmHg     Is BP treated: Yes     HDL Cholesterol: 49.8 mg/dL     Total Cholesterol: 132 mg/dL   Patient is currently  controlled on the following medications:  . Aspirin 81mg , 1 tablet once daily  . Atorvastatin 40mg , 1 tablet every day (takes every other day)   We discussed:  - taking medications as indicated. Spouse reported giving him atorvastatin every other night  Plan Recommend repeat lipid panel with initiation of Abilify.  Continue current medications  Hypothyroidism   Lab Results  Component Value Date/Time   TSH 1.75 06/11/2019 10:40 AM   TSH 2.57 05/29/2018 03:13 PM   TSH 0.696 08/27/2017 12:05 PM   .Lenoria Farrier Patient is currently controlled on the following medications:  . Levothyroxine 118mcg, 1 tablet once daily before breakfast   Plan Continue current medications  Pain   Patient is currently controlled on the following medications:   APAP 325mg , 1 tablet twice daily as needed for moderate pain or headache   Plan Continue current medications  CKD, Stage II   Kidney Function Lab Results  Component Value Date/Time   CREATININE 1.12 06/11/2019 10:40 AM   CREATININE 1.01 04/04/2019 12:40 PM   CREATININE 1.28 10/14/2015 12:28 PM   CREATININE 1.21 07/12/2015 12:07 PM   GFR 66.36 06/11/2019 10:40 AM   GFRNONAA 79 04/04/2019 12:40 PM   GFRAA 92 04/04/2019 12:40 PM   Kidney function stable.   Plan Continue to monitor and adjust medications as needed.    Vaccines   Reviewed and discussed patient's vaccination history.    Immunization History  Administered Date(s) Administered  . Influenza Split 08/18/2011  . Influenza,inj,Quad PF,6+ Mos 09/11/2013, 03/19/2014, 03/21/2017, 05/29/2018  . PFIZER  SARS-COV-2 Vaccination 09/29/2019, 10/20/2019  . Pneumococcal Conjugate-13 06/08/2014  . Td 07/31/2004  . Tdap 03/06/2016   COVID vaccine- Obtained at [facility])  [place]: 1st dose: 09/29/2019 2nd dose: 10/20/2019  Plan Sending message to CMA to add COVID vaccine information to patient's chart.    Medication Management  Patient organizes medications: Managed by spouse. Organized in pill box (AM and PM)  [facility]  Adherence: no gaps in refill history (per medication dispense report from 06/27/19 to 12/24/19)     Follow up Follow up visit with PharmD in September. Consulted with Dr. Elease Hashimoto. Agreed to referral to ophthalmology. Sending message to request this.   Anson Crofts, PharmD Clinical Pharmacist [facility] [phone]

## 2019-12-26 ENCOUNTER — Telehealth: Payer: Self-pay

## 2019-12-26 DIAGNOSIS — H538 Other visual disturbances: Secondary | ICD-10-CM

## 2019-12-26 NOTE — Patient Instructions (Addendum)
Visit Information  Goals Addressed            This Visit's Progress   . Pharmacy Care Plan       CARE PLAN ENTRY  Current Barriers:  . Chronic Disease Management support, education, and care coordination needs related to Hypertension, Heart Failure, Hypothyroidism, Depression, and mood disorder, history of MI   Hypertension . Pharmacist Clinical Goal(s): o Over the next  days, patient will work with PharmD and providers to maintain BP goal <130/80 . Current regimen:  o Carvedilol 6.25mg , 1 tablet twice daily with a meal  . Patient self care activities - Over the next 180 days, patient will: o Check BP every other day, document, and provide at future appointments  History of MI  . Pharmacist Clinical Goal(s): o Over the next 180 days, patient will work with PharmD and providers to maintain LDL goal < 67 . Current regimen:  . Aspirin 81mg , 1 tablet once daily  . Atorvastatin 40mg , 1 tablet every day (takes every other day)  . Interventions: o Discussed taking medications as indicated and report use at next visit with providers.  . Patient self care activities - Over the next 180 days, patient will: o Continue current medications.   Heart failure . Pharmacist Clinical Goal(s) o Over the next 180 days, patient will work with PharmD and providers to monitor weight and report weight increase/ shortness of breath.  . Current regimen:   Carvedilol 6.25mg , 1 tablet twice daily with a meal   Digoxin 0.125mg , 1 tablet once daily   Entresto 97-103mg , 1 tablet twice daily   Furosemide 20mg , 1 tablet four times a week   Farxiga 10mg , 1 tablet once daily before breakfast  . Interventions: o We discussed weighing daily; if you gain more than 3 pounds in one day or 5 pounds in one week call your doctor  . Patient self care activities - Over the next 180 days, patient will: o Continue monitoring weight.   Hypothyroidism . Pharmacist Clinical Goal(s) o Over the next 180 days,  patient will work with PharmD and providers to maintain TSH: 0.35 to 4.5 uIU/mL  . Current regimen:  o Levothyroxine 182mcg, 1 tablet once daily before breakfast  . Patient self care activities o Patient will continue current medications.   Depression/ mood disorder  . Pharmacist Clinical Goal(s) o Over the next 180 days, patient will work with PharmD and providers to Improve mood/ depression symptoms. . Current regimen:  . Sertraline 50mg , 1 tablet once daily . Aripiprazole (Abilify) 15mg , 1 tablet once daily  . Clonazepam 1mg , 1 tablet twice daily as needed (will take about 3x per week) only when anxiety is high.  . Interventions: o Consulting with Dr. Elease Hashimoto and reported blurry vision.  . Patient self care activities o Patient will continue current medications as directed.   Medication management . Pharmacist Clinical Goal(s): o Over the next 180 days, patient will work with PharmD and providers to maintain optimal medication adherence . [facility] . Interventions o Comprehensive medication review performed. o Continue current medication management strategy . Patient self care activities - Over the next 180 days, patient will: o Take medications as prescribed o Report any questions or concerns to PharmD and/or provider(s)  Initial goal documentation        Bradley [name] was given information about Chronic Care Management services today including:  1. CCM service includes personalized support from designated clinical staff supervised by his physician, including individualized plan of  care and coordination with other care providers 2. 24/7 contact phone numbers for assistance for urgent and routine care needs. 3. Standard insurance, coinsurance, copays and deductibles apply for chronic care management only during months in which we provide at least 20 minutes of these services. Most insurances cover these services at 100%, however patients may be responsible for any  copay, coinsurance and/or deductible if applicable. This service may help you avoid the need for more expensive face-to-face services. 4. Only one practitioner may furnish and bill the service in a calendar month. 5. The patient may stop CCM services at any time (effective at the end of the month) by phone call to the office staff.  Patient agreed to services and verbal consent obtained.   The patient verbalized understanding of instructions provided today and agreed to receive a mailed copy of patient instruction and/or educational materials. Telephone follow up appointment with pharmacy team member scheduled for: 04/28/2020 Anson Crofts, PharmD Clinical Pharmacist [facility] [place] [phone]   Heart Failure, Self Care Heart failure is a serious condition. This sheet explains things you need to do to take care of yourself at home. To help you stay as healthy as possible, you may be asked to change your diet, take certain medicines, and make other changes in your life. Your doctor may also give you more specific instructions. If you have problems or questions, call your doctor. What are the risks? Having heart failure makes it more likely for you to have some problems. These problems can get worse if you do not take good care of yourself. Problems may include:  Blood clotting problems. This may cause a stroke.  Damage to the kidneys, liver, or lungs.  Abnormal heart rhythms. Supplies needed:  Scale for weighing yourself.  Blood pressure monitor.  Notebook.  Medicines. How to care for yourself when you have heart failure Medicines Take over-the-counter and prescription medicines only as told by your doctor. Take your medicines every day.  Do not stop taking your medicine unless your doctor tells you to do so.  Do not skip any medicines.  Get your prescriptions refilled before you run out of medicine. This is important. Eating and drinking   Eat  heart-healthy foods. Talk with a diet specialist (dietitian) to create an eating plan.  Choose foods that: ? Have no trans fat. ? Are low in saturated fat and cholesterol.  Choose healthy foods, such as: ? Fresh or frozen fruits and vegetables. ? Fish. ? Low-fat (lean) meats. ? Legumes, such as beans, peas, and lentils. ? Fat-free or low-fat dairy products. ? Whole-grain foods. ? High-fiber foods.  Limit salt (sodium) if told by your doctor. Ask your diet specialist to tell you which seasonings are healthy for your heart.  Cook in healthy ways instead of frying. Healthy ways of cooking include roasting, grilling, broiling, baking, poaching, steaming, and stir-frying.  Limit how much fluid you drink, if told by your doctor. Alcohol use  Do not drink alcohol if: ? Your doctor tells you not to drink. ? Your heart was damaged by alcohol, or you have very bad heart failure. ? You are pregnant, may be pregnant, or are planning to become pregnant.  If you drink alcohol: ? Limit how much you use to:  0-1 drink a day for women.  0-2 drinks a day for men. ? Be aware of how much alcohol is in your drink. In the [place] one drink equals one 12 oz bottle of  beer (355 mL), one 5 oz glass of wine (148 mL), or one 1 oz glass of hard liquor (44 mL). Lifestyle   Do not use any products that contain nicotine or tobacco, such as cigarettes, e-cigarettes, and chewing tobacco. If you need help quitting, ask your doctor. ? Do not use nicotine gum or patches before talking to your doctor.  Do not use illegal drugs.  Lose weight if told by your doctor.  Do physical activity if told by your doctor. Talk to your doctor before you begin an exercise if: ? You are an older adult. ? You have very bad heart failure.  Learn to manage stress. If you need help, ask your doctor.  Get rehab (rehabilitation) to help you stay independent and to help with your quality of life.  Plan time to rest when you  get tired. Check weight and blood pressure   Weigh yourself every day. This will help you to know if fluid is building up in your body. ? Weigh yourself every morning after you pee (urinate) and before you eat breakfast. ? Wear the same amount of clothing each time. ? Write down your daily weight. Give your record to your doctor.  Check and write down your blood pressure as told by your doctor.  Check your pulse as told by your doctor. Dealing with very hot and very cold weather  If it is very hot: ? Avoid activities that take a lot of energy. ? Use air conditioning or fans, or find a cooler place. ? Avoid caffeine and alcohol. ? Wear clothing that is loose-fitting, lightweight, and light-colored.  If it is very cold: ? Avoid activities that take a lot of energy. ? Layer your clothes. ? Wear mittens or gloves, a hat, and a scarf when you go outside. ? Avoid alcohol. Follow these instructions at home:  Stay up to date with shots (vaccines). Get pneumococcal and flu (influenza) shots.  Keep all follow-up visits as told by your doctor. This is important. Contact a doctor if:  You gain weight quickly.  You have increasing shortness of breath.  You cannot do your normal activities.  You get tired easily.  You cough a lot.  You don't feel like eating or feel like you may vomit (nauseous).  You become puffy (swell) in your hands, feet, ankles, or belly (abdomen).  You cannot sleep well because it is hard to breathe.  You feel like your heart is beating fast (palpitations).  You get dizzy when you stand up. Get help right away if:  You have trouble breathing.  You or someone else notices a change in your behavior, such as having trouble staying awake.  You have chest pain or discomfort.  You pass out (faint). These symptoms may be an emergency. Do not wait to see if the symptoms will go away. Get medical help right away. Call your local emergency services (911 in  the [place] Do not drive yourself to the hospital. Summary  Heart failure is a serious condition. To care for yourself, you may have to change your diet, take medicines, and make other lifestyle changes.  Take your medicines every day. Do not stop taking them unless your doctor tells you to do so.  Eat heart-healthy foods, such as fresh or frozen fruits and vegetables, fish, lean meats, legumes, fat-free or low-fat dairy products, and whole-grain or high-fiber foods.  Ask your doctor if you can drink alcohol. You may have to stop alcohol use if you  have very bad heart failure.  Contact your doctor if you gain weight quickly or feel that your heart is beating too fast. Get help right away if you pass out, or have chest pain or trouble breathing. This information is not intended to replace advice given to you by your health care provider. Make sure you discuss any questions you have with your health care provider. Document Revised: 10/29/2018 Document Reviewed: 10/30/2018 Elsevier Patient Education  [place].

## 2019-12-26 NOTE — Telephone Encounter (Signed)
Hi [place],   Can you place a referral to ophthalmology? This has been discussed and approved by Dr. Elease Hashimoto.   Thanks!

## 2019-12-27 NOTE — Addendum Note (Signed)
Addended by: [name] on: 12/27/2019 03:56 PM   Modules accepted: Orders

## 2019-12-27 NOTE — Telephone Encounter (Signed)
I placed referral to ophthalmology.

## 2019-12-30 ENCOUNTER — Ambulatory Visit (INDEPENDENT_AMBULATORY_CARE_PROVIDER_SITE_OTHER): Payer: Medicare HMO

## 2019-12-30 DIAGNOSIS — Z95810 Presence of automatic (implantable) cardiac defibrillator: Secondary | ICD-10-CM | POA: Diagnosis not present

## 2019-12-30 DIAGNOSIS — I5042 Chronic combined systolic (congestive) and diastolic (congestive) heart failure: Secondary | ICD-10-CM | POA: Diagnosis not present

## 2019-12-31 NOTE — Progress Notes (Signed)
EPIC Encounter for ICM Monitoring  Patient Name: Bradley [name] is a 64 y.o. male Date: 12/31/2019 Primary Care Physican: [name], MD Heart Failure Clinic:[facility] Electrophysiologist: Caryl Comes Nephrologist:Deterding 12/31/2019 [id number]   Spoke with wife.  She said recent possible fluid accumulation maybe related to something he ate.  He is feeling fine at this time.   CorVue thoracic impedancesuggesting possible fluid accumulation from 5/28 - 6/1 but returned to normal.  Prescribed:Furosemide 20 mgtake1 tablettwice a week.   Labs: 06/11/2019 Creatinine 1.12, BUN 24, Potassium 4.7, Sodium 142, GFR 66.36 04/04/2019 Creatinine 1.01, BUN 18, Potassium 4.1, Sodium 142, GFR 79-92  Recommendations: No changes and encouraged to call if experiencing any fluid symptoms.  Follow-up plan: ICM clinic phone appointment on7/12/2019. 91 day device clinic remote transmission 03/11/2020.   Copy of ICM check sent to [place].  3 month ICM trend: 12/31/2019    1 Year ICM trend:       [name], RN 12/31/2019 10:51 AM

## 2020-01-03 ENCOUNTER — Other Ambulatory Visit: Payer: Self-pay | Admitting: Family Medicine

## 2020-01-05 NOTE — Telephone Encounter (Signed)
Called patient to relay information on ophthalmology referral. Spoke to patient's spouse [name]) on [place].   Relayed the following: Dr. Elease Hashimoto has placed referral to ophthalmology on 12/26/2019.  Spouse stated they have not heard from them yet.   Reviewed referral in patient's chart. Referral has been changed to authorized. Scheduling status: External- Ready to schedule. Advised spouse they should hear from them within next several days to schedule visit.  Provided the following information if they do not hear from them soon.   [facility] [place] [phone]  Spouse reported no further questions as everything was going well.  Patient has follow up visit with Dr. Elease Hashimoto on 02/13/2020.   Anson Crofts, PharmD Clinical Pharmacist [facility] [phone]

## 2020-01-06 DIAGNOSIS — I5042 Chronic combined systolic (congestive) and diastolic (congestive) heart failure: Secondary | ICD-10-CM | POA: Diagnosis not present

## 2020-01-31 ENCOUNTER — Other Ambulatory Visit (HOSPITAL_COMMUNITY): Payer: Self-pay | Admitting: Internal Medicine

## 2020-02-03 ENCOUNTER — Ambulatory Visit (INDEPENDENT_AMBULATORY_CARE_PROVIDER_SITE_OTHER): Payer: Medicare HMO

## 2020-02-03 DIAGNOSIS — Z95810 Presence of automatic (implantable) cardiac defibrillator: Secondary | ICD-10-CM | POA: Diagnosis not present

## 2020-02-03 DIAGNOSIS — I5042 Chronic combined systolic (congestive) and diastolic (congestive) heart failure: Secondary | ICD-10-CM | POA: Diagnosis not present

## 2020-02-04 ENCOUNTER — Telehealth: Payer: Self-pay

## 2020-02-04 NOTE — Progress Notes (Signed)
EPIC Encounter for ICM Monitoring  Patient Name: Bradley [name] is a 64 y.o. male Date: 02/04/2020 Primary Care Physican: [name], MD Heart Failure Clinic:[facility] Electrophysiologist: Caryl Comes Nephrologist:Deterding 12/31/2019 [id number]  Attempted call to wife/patient and unable to reach.  Left detailed message per DPR regarding transmission. Transmission reviewed.   CorVue thoracic impedancenormal but suggesting possible fluid accumulation from 6/29-7/1 and 7/3-7/5.  Prescribed:Furosemide 20 mgtake1 tablettwice a week.   Labs: 06/11/2019 Creatinine 1.12, BUN 24, Potassium 4.7, Sodium 142, GFR 66.36 04/04/2019 Creatinine 1.01, BUN 18, Potassium 4.1, Sodium 142, GFR 79-92  Recommendations: Left voice mail with ICM number and encouraged to call if experiencing any fluid symptoms.  Follow-up plan: ICM clinic phone appointment on8/13/2021. 91 day device clinic remote transmission 03/11/2020.   EP/Cardiology Office Visits: 04/01/2020 with Dr. Haroldine Laws and 04/13/2020 with Dr Caryl Comes.    Copy of ICM check sent to Dr. Caryl Comes.   3 month ICM trend: 02/03/2020    1 Year ICM trend:       [name], RN 02/04/2020 8:59 AM

## 2020-02-04 NOTE — Telephone Encounter (Signed)
Remote ICM transmission received.  Attempted call to wife/patient regarding ICM remote transmission and left detailed message per DPR.  Advised to return call for any fluid symptoms or questions.     

## 2020-02-05 DIAGNOSIS — I5042 Chronic combined systolic (congestive) and diastolic (congestive) heart failure: Secondary | ICD-10-CM | POA: Diagnosis not present

## 2020-02-13 ENCOUNTER — Encounter: Payer: Self-pay | Admitting: Family Medicine

## 2020-02-13 ENCOUNTER — Other Ambulatory Visit: Payer: Self-pay

## 2020-02-13 ENCOUNTER — Ambulatory Visit (INDEPENDENT_AMBULATORY_CARE_PROVIDER_SITE_OTHER): Payer: Medicare HMO | Admitting: Family Medicine

## 2020-02-13 VITALS — BP 122/64 | HR 60 | Temp 98.4°F | Wt 181.5 lb

## 2020-02-13 DIAGNOSIS — E039 Hypothyroidism, unspecified: Secondary | ICD-10-CM | POA: Diagnosis not present

## 2020-02-13 DIAGNOSIS — F339 Major depressive disorder, recurrent, unspecified: Secondary | ICD-10-CM | POA: Diagnosis not present

## 2020-02-13 DIAGNOSIS — I10 Essential (primary) hypertension: Secondary | ICD-10-CM | POA: Diagnosis not present

## 2020-02-13 DIAGNOSIS — I5042 Chronic combined systolic (congestive) and diastolic (congestive) heart failure: Secondary | ICD-10-CM

## 2020-02-13 NOTE — Progress Notes (Signed)
Established Patient Office Visit  Subjective:  Patient ID: Bradley [name], male    DOB: 10-21-1955  Age: 64 y.o. MRN: [id number]  CC:  Chief Complaint  Patient presents with  . Follow-up    pt is here for 2 month follow up still having restless sleep     HPI Bradley [name] presents for follow-up regarding anxiety, recurrent depression, insomnia.  Refer to previous note for details.  He had hospitalization back in 2013 for severe depression with psychotic features.  He has been on Zoloft for several years and takes this consistently.  He had recent PHQ 9 score of 24 and mood disorder questionnaire 11 out of 13 positive.  We had started Abilify at and titrated last visit to 15 mg and he feels this has helped somewhat with his agitation.  He does not feel he is resting well.  However, he is recently taken a lot of daytime naps and also sleeping in late and he realizes these may be affecting his sleep.  He has tried melatonin without improvement.  He does occasionally take Klonopin which helps.  No alcohol use.  No suicidal ideation.  He is not on any recent delusions.  No psychosis.  We referred recently for inguinal hernia.  He has not had any major pain.  He has pending a follow-up for that.  Multiple other chronic medical problems including history of combined systolic and diastolic heart failure, hypertension, hypothyroidism, chronic kidney disease stage II, generalized anxiety disorder.  Generally feels well overall.  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  He denies any recent chest pains.  No significant dyspnea.  Will be due for follow-up labs in a few months.  Blood pressures have been stable.  Past Medical History:  Diagnosis Date  . Anxiety   . Aortic stenosis    s/p Bentall with bioprosthetic AVR 02/2010; Last echo (9/11): Moderate LVH, EF 45-50%, AVR functioning appropriately, aortic valve mean gradient 21, diastolic  dysfunction. Chest MRA (2/13): Mild to moderate dilatation at the sinus of Valsalva at 4.1 cm, mild dilatation ascending aorta distal to the tube graft at 3.9 cm, moderate dilatation of the innominate artery a 2.1 cm;    . CHF (congestive heart failure) ([place])   . Depression   . ESRD on dialysis [facility])    a. 09/2013 felt to be related to gentamycin b. no longer requiring HD  . Hx of cardiac cath    a. LHC in 02/2010: normal cors  . Hx of echocardiogram 02/2014   Echo (8/15):  Mod LV, EF 35-40%, Gr 1 DD, AVR ok (mean 14 mmHg), mild LAE  . Hypertension   . Hypothyroidism, postsurgical   . Prosthetic valve endocarditis ([place])   . Staphylococcus aureus bacteremia   . Thyroid cancer ([place])   . Ventricular tachycardia ([place])    a. s/p STJ ICD    Past Surgical History:  Procedure Laterality Date  . AORTIC VALVE REPAIR  1968  . AORTIC VALVE REPLACEMENT  2011  . AV FISTULA PLACEMENT Left 10/08/2013   Procedure: ARTERIOVENOUS (AV) FISTULA CREATION- LEFT ARM; Radial Cephalic ;  Surgeon: Mal [name], MD;  Location: [place];  Service: Vascular;  Laterality: Left;  . BENTALL PROCEDURE N/A 10/28/2013   Procedure: REDO BENTALL PROCEDURE, debridment of aoritc root abscess, replacement of aortic root, ascending aorta and aortic valve with homograft. Insertion of left femoral arterial line;  Surgeon: [name],  MD;  Location: [facility] OR;  Service: Open Heart Surgery;  Laterality: N/A;  . BIOPSY  07/22/2019   Procedure: BIOPSY;  Surgeon: Doran Stabler, MD;  Location: [facility];  Service: Gastroenterology;;  . CARDIAC CATHETERIZATION  03/02/2010   NORMAL CORONARY ARTERY  . CARDIAC CATHETERIZATION N/A 01/01/2015   Procedure: Coronary/Graft Angiography;  Surgeon: Troy Sine, MD;  Location: [place] CV LAB;  Service: Cardiovascular;  Laterality: N/A;  . CARDIAC VALVE REPLACEMENT    . COLONOSCOPY WITH PROPOFOL N/A 07/22/2019   Procedure: COLONOSCOPY WITH PROPOFOL;  Surgeon: Doran Stabler, MD;   Location: [facility];  Service: Gastroenterology;  Laterality: N/A;  . EP IMPLANTABLE DEVICE N/A 01/01/2015   Procedure: ICD Implant;  Surgeon: Deboraha Sprang, MD;  Location: [place] CV LAB;  Service: Cardiovascular;  Laterality: N/A;  . HEMOSTASIS CLIP PLACEMENT  07/22/2019   Procedure: HEMOSTASIS CLIP PLACEMENT;  Surgeon: Doran Stabler, MD;  Location: [facility];  Service: Gastroenterology;;  . INTRAOPERATIVE TRANSESOPHAGEAL ECHOCARDIOGRAM N/A 10/28/2013   Procedure: INTRAOPERATIVE TRANSESOPHAGEAL ECHOCARDIOGRAM;  Surgeon: [name], MD;  Location: [place];  Service: Open Heart Surgery;  Laterality: N/A;  . PERIPHERAL VASCULAR CATHETERIZATION N/A 01/01/2015   Procedure: Aortic Arch Angiography;  Surgeon: Troy Sine, MD;  Location: [place] CV LAB;  Service: Cardiovascular;  Laterality: N/A;  . POLYPECTOMY  07/22/2019   Procedure: POLYPECTOMY;  Surgeon: Doran Stabler, MD;  Location: [facility];  Service: Gastroenterology;;  . SHOULDER ARTHROSCOPY W/ ROTATOR CUFF REPAIR Right 2012  . STERNOTOMY     REDO  . TEE WITHOUT CARDIOVERSION N/A 10/24/2013   Procedure: TRANSESOPHAGEAL ECHOCARDIOGRAM (TEE);  Surgeon: Dorothy Spark, MD;  Location: [facility] END[facility]COPY;  Service: Cardiovascular;  Laterality: N/A;  . THYROIDECTOMY  ~ 2005  . TRANSTHORACIC ECHOCARDIOGRAM  03/2010   SHOWED MILD REDUCTION OF LV FUNCTION    Family History  Problem Relation Age of Onset  . Hypertension Mother   . Heart disease Father   . Stroke Father   . Hypertension Father   . Thyroid cancer Sister   . Thyroid cancer Sister   . Kidney Stones Sister   . Heart attack Neg Hx   . Colon cancer Neg Hx   . Colon polyps Neg Hx   . Esophageal cancer Neg Hx   . Stomach cancer Neg Hx   . Rectal cancer Neg Hx     Social History   Socioeconomic History  . Marital status: Married    Spouse name: Not on file  . Number of children: Not on file  . Years of education: Not on file  . Highest education  level: Not on file  Occupational History  . Not on file  Tobacco Use  . Smoking status: Never Smoker  . Smokeless tobacco: Never Used  Vaping Use  . Vaping Use: Never used  Substance and Sexual Activity  . Alcohol use: No    Alcohol/week: 0.0 standard drinks    Comment: Occasional  . Drug use: No  . Sexual activity: Never  Other Topics Concern  . Not on file  Social History Narrative   Lives at home with walker.  Does not drive.  ESRD T, H, Sa.  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 Difficulty of Paying Living Expenses: Not hard at all  Food Insecurity:   . Worried About Charity fundraiser in the  Last Year:   . [place] in the Last Year:   Transportation Needs: No Transportation Needs  . Lack of Transportation (Medical): No  . Lack of Transportation (Non-Medical): No  Physical Activity:   . Days of Exercise per Week:   . Minutes of Exercise per Session:   Stress:   . Feeling of Stress :   Social Connections:   . Frequency of Communication with Friends and Family:   . Frequency of Social Gatherings with Friends and Family:   . Attends Religious Services:   . Active Member of Clubs or Organizations:   . Attends Archivist Meetings:   Marland Kitchen Marital Status:   Intimate Partner Violence:   . Fear of Current or Ex-Partner:   . Emotionally Abused:   Marland Kitchen Physically Abused:   . Sexually Abused:     Outpatient Medications Prior to Visit  Medication Sig Dispense Refill  . acetaminophen (TYLENOL) 325 MG tablet Take 325 mg by mouth 2 (two) times daily as needed for moderate pain or headache.    . ARIPiprazole (ABILIFY) 15 MG tablet TAKE 1 TABLET BY MOUTH EVERY DAY 90 tablet 1  . aspirin EC 81 MG tablet Take 81 mg by mouth at bedtime.     Marland Kitchen atorvastatin (LIPITOR) 40 MG tablet TAKE 1 TABLET BY MOUTH EVERY DAY (Patient taking differently: every other day. ) 90 tablet 1  . carvedilol (COREG) 6.25 MG  tablet TAKE 1 TABLET (6.25 MG TOTAL) BY MOUTH 2 (TWO) TIMES DAILY WITH A MEAL. 180 tablet 3  . clonazePAM (KLONOPIN) 1 MG tablet TAKE 1 TABLET BY MOUTH TWICE A DAY AS NEEDED *NOT FOR REGULAR USE* (Patient taking differently: Take 0.5 mg by mouth at bedtime. ) 30 tablet 0  . digoxin (LANOXIN) 0.125 MG tablet TAKE 1 TABLET BY MOUTH EVERY DAY 90 tablet 3  . docusate sodium (STOOL SOFTENER) 100 MG capsule Take 100 mg by mouth 2 (two) times daily.    . Ensure (ENSURE) Take 237 mLs by mouth daily as needed. (Patient taking differently: Take 237 mLs by mouth every other day. ) 237 mL 12  . ENTRESTO 97-103 MG TAKE 1 TABLET BY MOUTH TWICE A DAY 180 tablet 3  . FARXIGA 10 MG TABS tablet TAKE 1 TABLET BY MOUTH DAILY BEFORE BREAKFAST. 90 tablet 3  . furosemide (LASIX) 20 MG tablet TAKE 1 TABLET (20 MG TOTAL) BY MOUTH 4 (FOUR) TIMES A WEEK. (Patient taking differently: Take 20 mg by mouth 2 (two) times a week. ) 45 tablet 3  . levothyroxine (SYNTHROID) 175 MCG tablet TAKE 1 TABLET (175 MCG TOTAL) BY MOUTH DAILY BEFORE BREAKFAST. 90 tablet 3  . mexiletine (MEXITIL) 150 MG capsule Take 2 capsules (300 mg total) by mouth 2 (two) times daily. 180 capsule 3  . ranolazine (RANEXA) 500 MG 12 hr tablet TAKE 1 TABLET BY MOUTH TWICE A DAY **NEED OFFICE VISIT FOR FUTURE REFILLS** 180 tablet 3  . Salicylic Acid (WART REMOVER EX) Apply 1 application topically daily as needed (foot corns).    . sertraline (ZOLOFT) 50 MG tablet TAKE 1 TABLET BY MOUTH EVERY DAY 90 tablet 2   No facility-administered medications prior to visit.    Allergies  Allergen Reactions  . Oxycodone Other (See Comments)    Gives patient nightmares  . Rifampin Nausea Only    ROS Review of Systems  Constitutional: Negative for fatigue.  Eyes: Negative for visual disturbance.  Respiratory: Negative for cough, chest tightness and shortness  of breath.   Cardiovascular: Negative for chest pain, palpitations and leg swelling.  Gastrointestinal:  Negative for abdominal pain.  Neurological: Negative for dizziness, syncope, weakness, light-headedness and headaches.  Psychiatric/Behavioral: Positive for sleep disturbance. Negative for agitation, confusion, hallucinations and suicidal ideas.      Objective:    Physical Exam Vitals reviewed.  Constitutional:      Appearance: Normal appearance.  Cardiovascular:     Rate and Rhythm: Normal rate and regular rhythm.  Pulmonary:     Effort: Pulmonary effort is normal.     Breath sounds: Normal breath sounds.  Musculoskeletal:     Right lower leg: No edema.     Left lower leg: No edema.  Neurological:     General: No focal deficit present.     Mental Status: He is alert.     Cranial Nerves: No cranial nerve deficit.  Psychiatric:     Comments: PHQ 9 equals 12     BP 122/64 (BP Location: Left Arm, Patient Position: Sitting, Cuff Size: Normal)   Pulse 60   Temp 98.4 F (36.9 C) (Temporal)   Wt 181 lb 8 oz (82.3 kg)   SpO2 95%   BMI 23.95 kg/m  Wt Readings from Last 3 Encounters:  02/13/20 181 lb 8 oz (82.3 kg)  12/12/19 175 lb (79.4 kg)  11/21/19 178 lb 9.6 oz (81 kg)     Health Maintenance Due  Topic Date Due  . HIV Screening  Never done    There are no preventive care reminders to display for this patient.  Lab Results  Component Value Date   TSH 1.75 06/11/2019   Lab Results  Component Value Date   WBC 6.2 06/11/2019   HGB 14.0 06/11/2019   HCT 42.6 06/11/2019   MCV 95.9 06/11/2019   PLT 112.0 (L) 06/11/2019   Lab Results  Component Value Date   NA 142 06/11/2019   K 4.7 06/11/2019   CO2 32 06/11/2019   GLUCOSE 118 (H) 06/11/2019   BUN 24 (H) 06/11/2019   CREATININE 1.12 06/11/2019   BILITOT 0.4 06/11/2019   ALKPHOS 48 06/11/2019   AST 19 06/11/2019   ALT 18 06/11/2019   PROT 6.2 06/11/2019   ALBUMIN 4.0 06/11/2019   CALCIUM 9.5 06/11/2019   ANIONGAP 5 02/20/2018   GFR 66.36 06/11/2019   Lab Results  Component Value Date   CHOL 132  06/11/2019   Lab Results  Component Value Date   HDL 49.80 06/11/2019   Lab Results  Component Value Date   LDLCALC 67 06/11/2019   Lab Results  Component Value Date   TRIG 77.0 06/11/2019   Lab Results  Component Value Date   CHOLHDL 3 06/11/2019   Lab Results  Component Value Date   HGBA1C 5.9 (H) 12/30/2014      Assessment & Plan:   #1 history of recurrent depression and agitation.  Some improved with recent titration of Abilify to 15 mg.  He remains on sertraline as well.  His PHQ 9 score today is 12 which is significantly improved from 24 -Continue current medications.  We discussed possible titration of sertraline but at this point he feels improved enough to continue with current regimen.  Would get back into see psychiatry for any worsening symptoms  #2 insomnia. -Sleep hygiene discussed.  We recommended avoiding regular daytime naps and if napping limit to 1 nap per day and very brief.  Also suggested limits on how long he sleeps each morning  #  3 hypertension stable and at goal  #4 history of combined systolic and diastolic heart failure stable  #5 hypothyroidism.  Will be due for follow-up TSH in about 3 months and will schedule follow-up then  No orders of the defined types were placed in this encounter.   Follow-up: Return in about 3 months (around 05/15/2020).    [name], MD

## 2020-02-13 NOTE — Patient Instructions (Signed)

## 2020-02-26 ENCOUNTER — Other Ambulatory Visit: Payer: Self-pay | Admitting: Family Medicine

## 2020-03-07 DIAGNOSIS — I5042 Chronic combined systolic (congestive) and diastolic (congestive) heart failure: Secondary | ICD-10-CM | POA: Diagnosis not present

## 2020-03-09 DIAGNOSIS — H2513 Age-related nuclear cataract, bilateral: Secondary | ICD-10-CM | POA: Diagnosis not present

## 2020-03-09 DIAGNOSIS — H524 Presbyopia: Secondary | ICD-10-CM | POA: Diagnosis not present

## 2020-03-11 ENCOUNTER — Ambulatory Visit (INDEPENDENT_AMBULATORY_CARE_PROVIDER_SITE_OTHER): Payer: Medicare HMO | Admitting: *Deleted

## 2020-03-11 DIAGNOSIS — I472 Ventricular tachycardia, unspecified: Secondary | ICD-10-CM

## 2020-03-11 LAB — CUP PACEART REMOTE DEVICE CHECK
Battery Remaining Longevity: 50 mo
Battery Remaining Percentage: 48 %
Battery Voltage: 2.9 V
Brady Statistic RV Percent Paced: 1 %
Date Time Interrogation Session: 20210812095904
HighPow Impedance: 71 Ohm
HighPow Impedance: 71 Ohm
Implantable Lead Implant Date: 20160603
Implantable Lead Location: 753860
Implantable Lead Model: 7122
Implantable Pulse Generator Implant Date: 20160603
Lead Channel Impedance Value: 380 Ohm
Lead Channel Pacing Threshold Amplitude: 1.25 V
Lead Channel Pacing Threshold Pulse Width: 0.5 ms
Lead Channel Sensing Intrinsic Amplitude: 11.7 mV
Lead Channel Setting Pacing Amplitude: 2.5 V
Lead Channel Setting Pacing Pulse Width: 0.5 ms
Lead Channel Setting Sensing Sensitivity: 0.5 mV
Pulse Gen Serial Number: 7135169

## 2020-03-12 ENCOUNTER — Ambulatory Visit (INDEPENDENT_AMBULATORY_CARE_PROVIDER_SITE_OTHER): Payer: Medicare HMO

## 2020-03-12 DIAGNOSIS — Z95810 Presence of automatic (implantable) cardiac defibrillator: Secondary | ICD-10-CM

## 2020-03-12 DIAGNOSIS — I5042 Chronic combined systolic (congestive) and diastolic (congestive) heart failure: Secondary | ICD-10-CM | POA: Diagnosis not present

## 2020-03-12 NOTE — Progress Notes (Signed)
EPIC Encounter for ICM Monitoring  Patient Name: Bradley [name] is a 64 y.o. male Date: 03/12/2020 Primary Care Physican: [name], MD Heart Failure Clinic:[facility] Electrophysiologist: Caryl Comes Nephrologist:Deterding 7/16/2021Office [phone]  Transmission reviewed.   CorVue thoracic impedance suggesting normal fluid levels.  Prescribed:Furosemide 20 mgtake1 tablettwice a week.   Labs: 06/11/2019 Creatinine 1.12, BUN 24, Potassium 4.7, Sodium 142, GFR 66.36 04/04/2019 Creatinine 1.01, BUN 18, Potassium 4.1, Sodium 142, GFR 79-92  Recommendations: No changes  Follow-up plan: ICM clinic phone appointment on9/13/2021. 91 day device clinic remote transmission11/05/2020.   EP/Cardiology Office Visits: 04/01/2020 with Dr. Haroldine Laws and 04/13/2020 with Dr Caryl Comes.    Copy of ICM check sent to Dr. Caryl Comes.   3 month ICM trend: 03/11/2020    1 Year ICM trend:       [name], RN 03/12/2020 12:42 PM

## 2020-03-15 NOTE — Progress Notes (Signed)
Remote ICD transmission.   

## 2020-03-25 ENCOUNTER — Other Ambulatory Visit: Payer: Self-pay | Admitting: Family Medicine

## 2020-04-01 ENCOUNTER — Ambulatory Visit (HOSPITAL_COMMUNITY)
Admission: RE | Admit: 2020-04-01 | Discharge: 2020-04-01 | Disposition: A | Discharge status: Home / Self Care | Payer: Medicare HMO | Source: Ambulatory Visit | Attending: Internal Medicine | Admitting: Internal Medicine

## 2020-04-01 ENCOUNTER — Other Ambulatory Visit: Payer: Self-pay

## 2020-04-01 VITALS — BP 106/56 | HR 52 | Wt 182.0 lb

## 2020-04-01 DIAGNOSIS — Z885 Allergy status to narcotic agent status: Secondary | ICD-10-CM | POA: Insufficient documentation

## 2020-04-01 DIAGNOSIS — Z79899 Other long term (current) drug therapy: Secondary | ICD-10-CM | POA: Diagnosis not present

## 2020-04-01 DIAGNOSIS — I132 Hypertensive heart and chronic kidney disease with heart failure and with stage 5 chronic kidney disease, or end stage renal disease: Secondary | ICD-10-CM | POA: Diagnosis not present

## 2020-04-01 DIAGNOSIS — F419 Anxiety disorder, unspecified: Secondary | ICD-10-CM | POA: Diagnosis not present

## 2020-04-01 DIAGNOSIS — I10 Essential (primary) hypertension: Secondary | ICD-10-CM | POA: Diagnosis not present

## 2020-04-01 DIAGNOSIS — E890 Postprocedural hypothyroidism: Secondary | ICD-10-CM | POA: Diagnosis not present

## 2020-04-01 DIAGNOSIS — Z7984 Long term (current) use of oral hypoglycemic drugs: Secondary | ICD-10-CM | POA: Diagnosis not present

## 2020-04-01 DIAGNOSIS — Z953 Presence of xenogenic heart valve: Secondary | ICD-10-CM | POA: Insufficient documentation

## 2020-04-01 DIAGNOSIS — Z7982 Long term (current) use of aspirin: Secondary | ICD-10-CM | POA: Insufficient documentation

## 2020-04-01 DIAGNOSIS — I5022 Chronic systolic (congestive) heart failure: Secondary | ICD-10-CM | POA: Insufficient documentation

## 2020-04-01 DIAGNOSIS — Z992 Dependence on renal dialysis: Secondary | ICD-10-CM | POA: Insufficient documentation

## 2020-04-01 DIAGNOSIS — N186 End stage renal disease: Secondary | ICD-10-CM | POA: Diagnosis not present

## 2020-04-01 DIAGNOSIS — F329 Major depressive disorder, single episode, unspecified: Secondary | ICD-10-CM | POA: Insufficient documentation

## 2020-04-01 DIAGNOSIS — Z8249 Family history of ischemic heart disease and other diseases of the circulatory system: Secondary | ICD-10-CM | POA: Diagnosis not present

## 2020-04-01 DIAGNOSIS — Z85850 Personal history of malignant neoplasm of thyroid: Secondary | ICD-10-CM | POA: Insufficient documentation

## 2020-04-01 DIAGNOSIS — Z95810 Presence of automatic (implantable) cardiac defibrillator: Secondary | ICD-10-CM

## 2020-04-01 DIAGNOSIS — I350 Nonrheumatic aortic (valve) stenosis: Secondary | ICD-10-CM | POA: Insufficient documentation

## 2020-04-01 DIAGNOSIS — I428 Other cardiomyopathies: Secondary | ICD-10-CM | POA: Diagnosis not present

## 2020-04-01 DIAGNOSIS — Z79890 Hormone replacement therapy (postmenopausal): Secondary | ICD-10-CM | POA: Insufficient documentation

## 2020-04-01 NOTE — Patient Instructions (Signed)
Labs done today, your results will be available in MyChart, we will contact you for abnormal readings.  Your physician has requested that you have an echocardiogram. Echocardiography is a painless test that uses sound waves to create images of your heart. It provides your doctor with information about the size and shape of your heart and how well your heart's chambers and valves are working. This procedure takes approximately one hour. There are no restrictions for this procedure.   Please call our office in March 2022 to schedule your follow up appointment  If you have any questions or concerns before your next appointment please send [place] a message through [place] or call our office at [phone].    TO LEAVE A MESSAGE FOR THE NURSE SELECT OPTION 2, PLEASE LEAVE A MESSAGE INCLUDING: . YOUR NAME . DATE OF BIRTH . CALL BACK NUMBER . REASON FOR CALL**this is important as we prioritize the call backs  [place] AS LONG AS YOU CALL BEFORE 4:00 PM  At the [facility], you and your health needs are our priority. As part of our continuing mission to provide you with exceptional heart care, we have created designated Provider Care Teams. These Care Teams include your primary Cardiologist (physician) and Advanced Practice Providers (APPs- Physician Assistants and Nurse Practitioners) who all work together to provide you with the care you need, when you need it.   You may see any of the following providers on your designated Care Team at your next follow up: Marland Kitchen Dr Glori Bickers . Dr Loralie Champagne . Darrick Grinder, NP . Lyda Jester, PA . Audry Riles, PharmD   Please be sure to bring in all your medications bottles to every appointment.

## 2020-04-01 NOTE — Progress Notes (Signed)
Heart Failure Clinic Note  Date:  04/01/2020   ID:  ESTEFAN PATTISON, DOB [birth year], MRN [id number]  Location: Home  Provider location: [facility] Type of Visit: Established patient  PCP:  [name], MD  Cardiologist:  Dorris Carnes, MD Primary HF: Aydrien Froman  Chief Complaint: Heart Failure follow-up   History of Present Illness:  SEVON ROTERT is a 64 y/o male with a complicated past medical history including thyroid cancer (s/p thyroidectomy), hypertension, depression, and congenital aortic stenosis for which he underwent aortic vavulotomy at age 53 with subsequent Bentall procedure in 2001 with a pericardial tissue valve. He had MSSA bacteremia and bacterial endocarditis in 09/8411 complicated by renal failure requiring dialysis for a short period of time. 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   He was admitted 11/2014 with progressive episodes of palpitations and pre-syncope. He was found to have monomorphic ventricular tachycardia. Catheterization demonstrated normal coronaries. Echocardiogram demonstrated EF 30-35% and he underwent STJ dual chamber ICD implant.  He presents for routine f/u today. At last visit added Farxiga. Overall feeling fine. SOB with steps. Denies PND/Orthopnea. Appetite ok. No fever or chills. Weight at home has been stable.  Taking all medications   Failed Cologard. ---2021 Colonoscopy-- 3 polyps. Follow in 3 years. .   Studies: Echo 2020 30-35%  Echo 7/19  30-35% AVR stable. Personally reviewed Echo 5/18 EF 25-30%   CPX 01/01/17  FEV1 3.40 (86%)     FEV1/FVC 83 (107%)     MVV 163 (108%)  Resting HR: 72 Peak HR: 129  (80% age predicted max HR) BP rest: 118/70 BP peak: 174/84  Peak VO2: 21.1 (73% predicted peak VO2) VE/VCO2 slope: 35 OUES: 1.96 Peak RER:  1.22 Ventilatory Threshold: 16.6 (58% predicted or measured peak VO2) VE/MVV: 50% O2pulse: 15  (100% predicted O2pulse)    Past Medical History:  Diagnosis Date  . Anxiety   . Aortic stenosis    s/p Bentall with bioprosthetic AVR 02/2010; Last echo (9/11): Moderate LVH, EF 45-50%, AVR functioning appropriately, aortic valve mean gradient 21, diastolic dysfunction. Chest MRA (2/13): Mild to moderate dilatation at the sinus of Valsalva at 4.1 cm, mild dilatation ascending aorta distal to the tube graft at 3.9 cm, moderate dilatation of the innominate artery a 2.1 cm;    . CHF (congestive heart failure) ([place])   . Depression   . ESRD on dialysis [facility])    a. 09/2013 felt to be related to gentamycin b. no longer requiring HD  . Hx of cardiac cath    a. LHC in 02/2010: normal cors  . Hx of echocardiogram 02/2014   Echo (8/15):  Mod LV, EF 35-40%, Gr 1 DD, AVR ok (mean 14 mmHg), mild LAE  . Hypertension   . Hypothyroidism, postsurgical   . Prosthetic valve endocarditis ([place])   . Staphylococcus aureus bacteremia   . Thyroid cancer ([place])   . Ventricular tachycardia ([place])    a. s/p STJ ICD   Past Surgical History:  Procedure Laterality Date  . AORTIC VALVE REPAIR  1968  . AORTIC VALVE REPLACEMENT  2011  . AV FISTULA PLACEMENT Left 10/08/2013   Procedure: ARTERIOVENOUS (AV) FISTULA CREATION- LEFT ARM; Radial Cephalic ;  Surgeon: Mal [name], MD;  Location: [place];  Service: Vascular;  Laterality: Left;  . BENTALL PROCEDURE N/A 10/28/2013  Procedure: REDO BENTALL PROCEDURE, debridment of aoritc root abscess, replacement of aortic root, ascending aorta and aortic valve with homograft. Insertion of left femoral arterial line;  Surgeon: [name], MD;  Location: [place];  Service: Open Heart Surgery;  Laterality: N/A;  . BIOPSY  07/22/2019   Procedure: BIOPSY;  Surgeon: Doran Stabler, MD;  [facility];  Service: Gastroenterology;;  . CARDIAC CATHETERIZATION  03/02/2010    NORMAL CORONARY ARTERY  . CARDIAC CATHETERIZATION N/A 01/01/2015   Procedure: Coronary/Graft Angiography;  Surgeon: Troy Sine, MD;  Location: [place] CV [facility];  Service: Cardiovascular;  Laterality: N/A;  . CARDIAC VALVE REPLACEMENT    . COLONOSCOPY WITH PROPOFOL N/A 07/22/2019   Procedure: COLONOSCOPY WITH PROPOFOL;  Surgeon: Doran Stabler, MD;  [facility];  Service: Gastroenterology;  Laterality: N/A;  . EP IMPLANTABLE DEVICE N/A 01/01/2015   Procedure: ICD Implant;  Surgeon: Deboraha Sprang, MD;  Location: [place] CV [facility];  Service: Cardiovascular;  Laterality: N/A;  . HEMOSTASIS CLIP PLACEMENT  07/22/2019   Procedure: HEMOSTASIS CLIP PLACEMENT;  Surgeon: Doran Stabler, MD;  [facility];  Service: Gastroenterology;;  . INTRAOPERATIVE TRANSESOPHAGEAL ECHOCARDIOGRAM N/A 10/28/2013   Procedure: INTRAOPERATIVE TRANSESOPHAGEAL ECHOCARDIOGRAM;  Surgeon: [name], MD;  Location: [place];  Service: Open Heart Surgery;  Laterality: N/A;  . PERIPHERAL VASCULAR CATHETERIZATION N/A 01/01/2015   Procedure: Aortic Arch Angiography;  Surgeon: Troy Sine, MD;  Location: [place] CV [facility];  Service: Cardiovascular;  Laterality: N/A;  . POLYPECTOMY  07/22/2019   Procedure: POLYPECTOMY;  Surgeon: Doran Stabler, MD;  [facility];  Service: Gastroenterology;;  . SHOULDER ARTHROSCOPY W/ ROTATOR CUFF REPAIR Right 2012  . STERNOTOMY     REDO  . TEE WITHOUT CARDIOVERSION N/A 10/24/2013   Procedure: TRANSESOPHAGEAL ECHOCARDIOGRAM (TEE);  Surgeon: Dorothy Spark, MD;  Location: [facility] ENDOSCOPY;  Service: Cardiovascular;  Laterality: N/A;  . THYROIDECTOMY  ~ 2005  . TRANSTHORACIC ECHOCARDIOGRAM  03/2010   SHOWED MILD REDUCTION OF LV FUNCTION     Current Outpatient Medications  Medication Sig Dispense Refill  . acetaminophen (TYLENOL) 325 MG tablet Take 325 mg by mouth 2 (two) times daily as needed for moderate pain or headache.    . ARIPiprazole  (ABILIFY) 15 MG tablet TAKE 1 TABLET BY MOUTH EVERY DAY 90 tablet 1  . aspirin EC 81 MG tablet Take 81 mg by mouth at bedtime.     Marland Kitchen atorvastatin (LIPITOR) 40 MG tablet TAKE 1 TABLET BY MOUTH EVERY DAY 90 tablet 1  . carvedilol (COREG) 6.25 MG tablet TAKE 1 TABLET (6.25 MG TOTAL) BY MOUTH 2 (TWO) TIMES DAILY WITH A MEAL. 180 tablet 3  . clonazePAM (KLONOPIN) 1 MG tablet TAKE 1 TABLET BY MOUTH TWICE A DAY AS NEEDED *NOT FOR REGULAR USE* 30 tablet 0  . digoxin (LANOXIN) 0.125 MG tablet TAKE 1 TABLET BY MOUTH EVERY DAY 90 tablet 3  . docusate sodium (STOOL SOFTENER) 100 MG capsule Take 100 mg by mouth 2 (two) times daily.    . Ensure (ENSURE) Take 237 mLs by mouth daily as needed. (Patient taking differently: Take 237 mLs by mouth every other day. ) 237 mL 12  . ENTRESTO 97-103 MG TAKE 1 TABLET BY MOUTH TWICE A DAY 180 tablet 3  . FARXIGA 10 MG TABS tablet TAKE 1 TABLET BY MOUTH DAILY BEFORE BREAKFAST. 90 tablet 3  . furosemide (LASIX) 20 MG tablet Take 20 mg  by mouth 3 (three) times a week. Mon, Thur, Sat    . levothyroxine (SYNTHROID) 175 MCG tablet TAKE 1 TABLET (175 MCG TOTAL) BY MOUTH DAILY BEFORE BREAKFAST. 90 tablet 3  . mexiletine (MEXITIL) 150 MG capsule Take 2 capsules (300 mg total) by mouth 2 (two) times daily. 180 capsule 3  . ranolazine (RANEXA) 500 MG 12 hr tablet TAKE 1 TABLET BY MOUTH TWICE A DAY **NEED OFFICE VISIT FOR FUTURE REFILLS** 180 tablet 3  . Salicylic Acid (WART REMOVER EX) Apply 1 application topically daily as needed (foot corns).    . sertraline (ZOLOFT) 50 MG tablet TAKE 1 TABLET BY MOUTH EVERY DAY 90 tablet 2   No current facility-administered medications for this encounter.    Allergies:   Oxycodone and Rifampin   Social History:  The patient  reports that he has never smoked. He has never used smokeless tobacco. He reports that he does not drink alcohol and does not use drugs.   Family History:  The patient's family history includes Heart disease in his father;  Hypertension in his father and mother; Kidney Stones in his sister; Stroke in his father; Thyroid cancer in his sister and sister.   ROS:  Please see the history of present illness.   All other systems are personally reviewed and negative.   Vitals:   04/01/20 1446  BP: (!) 106/56  Pulse: (!) 52  SpO2: 99%  Weight: 82.6 kg (182 lb)   Wt Readings from Last 3 Encounters:  04/01/20 82.6 kg (182 lb)  02/13/20 82.3 kg (181 lb 8 oz)  12/12/19 79.4 kg (175 lb)    Exam:  General:  Well appearing. No resp difficulty HEENT: normal Neck: supple. no JVD. Carotids 2+ bilat; no bruits. No lymphadenopathy or thryomegaly appreciated. Cor: PMI nondisplaced. Regular rate & rhythm. No rubs, gallops or murmurs. Lungs: clear Abdomen: soft, nontender, nondistended. No hepatosplenomegaly. No bruits or masses. Good bowel sounds. Extremities: no cyanosis, clubbing, rash, edema Neuro: alert & orientedx3, cranial nerves grossly intact. moves all 4 extremities w/o difficulty. Affect pleasant  Recent Labs: 06/11/2019: ALT 18; BUN 24; Creatinine, Ser 1.12; Hemoglobin 14.0; Platelets 112.0; Potassium 4.7; Sodium 142; TSH 1.75  Personally reviewed   Wt Readings from Last 3 Encounters:  04/01/20 82.6 kg (182 lb)  02/13/20 82.3 kg (181 lb 8 oz)  12/12/19 79.4 kg (175 lb)      ASSESSMENT AND PLAN:  1. Chronic systolic HF  - NICM St Jude  Echo 5/17 LVEF 25-30%, Mild MR, Mod LAE, RV mildly dilated, Mild RAE. ECHO 12/21/2016 LVEF 25-30%. IW AK RV mild-mod  - Echo 02/20/18 shows 30-35% AVR stable.  - Echo 2020 EF 25% Personally reviewed -NYHA II - Volume status stable. Continue lasix 3 times a week.  - March 2017 CPX with moderate HF limitation.  - CPX 6/18 much improved. Peak VO2: 21.1 (73% predicted peak VO2) VE/VCO2 slope: 35 - Continue Entresto 97/103 mg BID.  - Continue coreg 6.25 mg BID. Unable to titrate due to low HR - Continue digoxin 0.125 mg daily.  -Continue Farxiga 10 mg daily.  2. VT/NICM -  Quiescent on mexilitene/Ranexa - ICD interrogated in clinic personally.  3. HTN - Blood pressure well controlled. Continue current regimen. 4. Valvular heart disease - s/p bentall in 2001 and re-do bentall 2015. Stable on recent echo  - Reinforced need for SBE 5. Depression - Stable.   Check BMET Check CPX test.  Follow up in 3-4 months.   Signed,  Darrick Grinder, NP  04/01/2020 2:55 PM  [facility] [place] [address] Heart and [place] [address] [phone] (office) [phone] (fax)'  Patient seen and examined with the above-signed Advanced Practice Provider and/or Housestaff. I personally reviewed laboratory data, imaging studies and relevant notes. I independently examined the patient and formulated the important aspects of the plan. I have edited the note to reflect any of my changes or salient points. I have personally discussed the plan with the patient and/or family.  He is doing well. Stable NYHA II-III. Volume status looks good.  On good meds.   General:  Well appearing. No resp difficulty HEENT: normal Neck: supple. no JVD. Carotids 2+ bilat; no bruits. No lymphadenopathy or thryomegaly appreciated. Cor: PMI nondisplaced. Regular rate & rhythm. 2/6 SEM RSB. Lungs: mildly decreased Abdomen: soft, nontender, nondistended. No hepatosplenomegaly. No bruits or masses. Good bowel sounds. Extremities: no cyanosis, clubbing, rash, edema Neuro: alert & orientedx3, cranial nerves grossly intact. moves all 4 extremities w/o difficulty. Affect pleasant  Doing well. Stable NYHA II-early III. ICD interrogation looks good. Fluid ok No VT. RTC in 6 months with repeat echo. If symptoms get worse will need CPX.  Glori Bickers, MD  3:15 PM

## 2020-04-08 ENCOUNTER — Other Ambulatory Visit: Payer: Self-pay

## 2020-04-08 ENCOUNTER — Ambulatory Visit (HOSPITAL_COMMUNITY)
Admission: RE | Admit: 2020-04-08 | Discharge: 2020-04-08 | Disposition: A | Discharge status: Home / Self Care | Payer: Medicare HMO | Source: Ambulatory Visit | Attending: Internal Medicine | Admitting: Internal Medicine

## 2020-04-08 DIAGNOSIS — I5022 Chronic systolic (congestive) heart failure: Secondary | ICD-10-CM | POA: Diagnosis not present

## 2020-04-08 NOTE — Progress Notes (Signed)
  Echocardiogram 2D Echocardiogram has been performed.  [name] [name] 04/08/2020, 4:08 PM

## 2020-04-12 ENCOUNTER — Ambulatory Visit (INDEPENDENT_AMBULATORY_CARE_PROVIDER_SITE_OTHER): Payer: Medicare HMO

## 2020-04-12 DIAGNOSIS — I5022 Chronic systolic (congestive) heart failure: Secondary | ICD-10-CM | POA: Diagnosis not present

## 2020-04-12 DIAGNOSIS — Z95810 Presence of automatic (implantable) cardiac defibrillator: Secondary | ICD-10-CM | POA: Diagnosis not present

## 2020-04-13 ENCOUNTER — Other Ambulatory Visit: Payer: Self-pay

## 2020-04-13 ENCOUNTER — Encounter: Payer: Self-pay | Admitting: Internal Medicine

## 2020-04-13 ENCOUNTER — Ambulatory Visit (INDEPENDENT_AMBULATORY_CARE_PROVIDER_SITE_OTHER): Payer: Medicare HMO | Admitting: Internal Medicine

## 2020-04-13 VITALS — BP 110/66 | HR 57 | Ht 73.0 in | Wt 182.6 lb

## 2020-04-13 DIAGNOSIS — I5042 Chronic combined systolic (congestive) and diastolic (congestive) heart failure: Secondary | ICD-10-CM

## 2020-04-13 DIAGNOSIS — I472 Ventricular tachycardia, unspecified: Secondary | ICD-10-CM

## 2020-04-13 DIAGNOSIS — I428 Other cardiomyopathies: Secondary | ICD-10-CM | POA: Diagnosis not present

## 2020-04-13 DIAGNOSIS — Z95810 Presence of automatic (implantable) cardiac defibrillator: Secondary | ICD-10-CM

## 2020-04-13 DIAGNOSIS — Z79899 Other long term (current) drug therapy: Secondary | ICD-10-CM

## 2020-04-13 NOTE — Patient Instructions (Addendum)
Medication Instructions:  Your physician recommends that you continue on your current medications as directed. Please refer to the Current Medication list given to you today. *If you need a refill on your cardiac medications before your next appointment, please call your pharmacy*   Lab Work: Digoxin and BMET Level today If you have labs (blood work) drawn today and your tests are completely normal, you will receive your results only by: Marland Kitchen MyChart Message (if you have MyChart) OR . A paper copy in the mail If you have any lab test that is abnormal or we need to change your treatment, we will call you to review the results.   Testing/Procedures: None ordered.    Follow-Up: At [facility], you and your health needs are our priority.  As part of our continuing mission to provide you with exceptional heart care, we have created designated Provider Care Teams.  These Care Teams include your primary Cardiologist (physician) and Advanced Practice Providers (APPs -  Physician Assistants and Nurse Practitioners) who all work together to provide you with the care you need, when you need it.  We recommend signing up for the patient portal called "MyChart".  Sign up information is provided on this After Visit Summary.  MyChart is used to connect with patients for Virtual Visits (Telemedicine).  Patients are able to view lab/test results, encounter notes, upcoming appointments, etc.  Non-urgent messages can be sent to your provider as well.   To learn more about what you can do with MyChart, go to [email or web address].    Your next appointment:   12 month(s)  The format for your next appointment:   In Person  Provider:  Dr Caryl Comes

## 2020-04-13 NOTE — Progress Notes (Signed)
Patient Care Team: [name], MD as PCP - Desma Paganini, MD as PCP - Cardiology (Cardiology) Deboraha Sprang, MD as PCP - Electrophysiology (Cardiology) Bensimhon, Shaune Pascal, MD as PCP - Advanced Heart Failure (Cardiology) [name], MD as Consulting Physician (Cardiothoracic Surgery) Deterding, Jeneen Rinks, MD as Consulting Physician (Nephrology) Deboraha Sprang, MD as Consulting Physician (Cardiology) Earnie Larsson, [facility] as Pharmacist (Pharmacist)   HPI  Bradley [name] is a 64 y.o. male Seen in follow-up for implantable defibrillator St Jude  for sustained ventricular tachycardia 5/16 occurring with a frequency that required adjunctive medical therapy. He ended up on amiodarone but then switched to mex and ranolazine.   A balloon valvotomy at age 55 for presumed congenital aortic stenosis   Bentall procedure in 2001 with a pericardial tissue valve. He had MSSA bacteremia and bacterial endocarditis in 08/945 complicated by renal failure requiring dialysis for a short period of time. He was then readmitted 09/2013 with sternoclavicular osteomyelitis and was found to have perivalvular abscess requiring redo Bentall with homograft and debridement of root abscess   The patient denies chest pain , nocturnal dyspnea, orthopnea or peripheral edema.  There have been no palpitations, lightheadedness or syncope.       History of appropriate therapy: yes - ATP Antiarrhythmics Date Reason stopped  Amiodarone  decision   Mexiletine  2016 ongoing  Ranolazine 2016  ongoing   Date Cr K Dig TSH  10/19 1.08 4.5  2.57   11/20 1.12 4.7  1.75   DATE TEST EF   10/14 Echo  60-65 %   6/16 Echo  30-35 %   5/18 Echo  25-30%   7/19 Echo  30-35%   11/20            Past Medical History:  Diagnosis Date  . Anxiety   . Aortic stenosis    s/p Bentall with bioprosthetic AVR 02/2010; Last echo (9/11): Moderate LVH, EF 45-50%, AVR functioning appropriately, aortic  valve mean gradient 21, diastolic dysfunction. Chest MRA (2/13): Mild to moderate dilatation at the sinus of Valsalva at 4.1 cm, mild dilatation ascending aorta distal to the tube graft at 3.9 cm, moderate dilatation of the innominate artery a 2.1 cm;    . CHF (congestive heart failure) ([place])   . Depression   . ESRD on dialysis [facility])    a. 09/2013 felt to be related to gentamycin b. no longer requiring HD  . Hx of cardiac cath    a. LHC in 02/2010: normal cors  . Hx of echocardiogram 02/2014   Echo (8/15):  Mod LV, EF 35-40%, Gr 1 DD, AVR ok (mean 14 mmHg), mild LAE  . Hypertension   . Hypothyroidism, postsurgical   . Prosthetic valve endocarditis ([place])   . Staphylococcus aureus bacteremia   . Thyroid cancer ([place])   . Ventricular tachycardia ([place])    a. s/p STJ ICD    Past Surgical History:  Procedure Laterality Date  . AORTIC VALVE REPAIR  1968  . AORTIC VALVE REPLACEMENT  2011  . AV FISTULA PLACEMENT Left 10/08/2013   Procedure: ARTERIOVENOUS (AV) FISTULA CREATION- LEFT ARM; Radial Cephalic ;  Surgeon: Mal [name], MD;  Location: [place];  Service: Vascular;  Laterality: Left;  . BENTALL PROCEDURE N/A 10/28/2013   Procedure: REDO BENTALL PROCEDURE, debridment of aoritc root abscess, replacement of aortic root, ascending aorta and aortic valve with homograft. Insertion of left femoral arterial line;  Surgeon: Percell Miller  Maryruth Bun, MD;  Location: [place];  Service: Open Heart Surgery;  Laterality: N/A;  . BIOPSY  07/22/2019   Procedure: BIOPSY;  Surgeon: Doran Stabler, MD;  [facility];  Service: Gastroenterology;;  . CARDIAC CATHETERIZATION  03/02/2010   NORMAL CORONARY ARTERY  . CARDIAC CATHETERIZATION N/A 01/01/2015   Procedure: Coronary/Graft Angiography;  Surgeon: Troy Sine, MD;  Location: [place] CV LAB;  Service: Cardiovascular;  Laterality: N/A;  . CARDIAC VALVE REPLACEMENT    . COLONOSCOPY WITH PROPOFOL N/A 07/22/2019   Procedure: COLONOSCOPY WITH PROPOFOL;   Surgeon: Doran Stabler, MD;  [facility];  Service: Gastroenterology;  Laterality: N/A;  . EP IMPLANTABLE DEVICE N/A 01/01/2015   Procedure: ICD Implant;  Surgeon: Deboraha Sprang, MD;  Location: [place] CV LAB;  Service: Cardiovascular;  Laterality: N/A;  . HEMOSTASIS CLIP PLACEMENT  07/22/2019   Procedure: HEMOSTASIS CLIP PLACEMENT;  Surgeon: Doran Stabler, MD;  [facility];  Service: Gastroenterology;;  . INTRAOPERATIVE TRANSESOPHAGEAL ECHOCARDIOGRAM N/A 10/28/2013   Procedure: INTRAOPERATIVE TRANSESOPHAGEAL ECHOCARDIOGRAM;  Surgeon: [name], MD;  Location: [place];  Service: Open Heart Surgery;  Laterality: N/A;  . PERIPHERAL VASCULAR CATHETERIZATION N/A 01/01/2015   Procedure: Aortic Arch Angiography;  Surgeon: Troy Sine, MD;  Location: [place] CV LAB;  Service: Cardiovascular;  Laterality: N/A;  . POLYPECTOMY  07/22/2019   Procedure: POLYPECTOMY;  Surgeon: Doran Stabler, MD;  [facility];  Service: Gastroenterology;;  . SHOULDER ARTHROSCOPY W/ ROTATOR CUFF REPAIR Right 2012  . STERNOTOMY     REDO  . TEE WITHOUT CARDIOVERSION N/A 10/24/2013   Procedure: TRANSESOPHAGEAL ECHOCARDIOGRAM (TEE);  Surgeon: Dorothy Spark, MD;  Location: [facility] ENDOSCOPY;  Service: Cardiovascular;  Laterality: N/A;  . THYROIDECTOMY  ~ 2005  . TRANSTHORACIC ECHOCARDIOGRAM  03/2010   SHOWED MILD REDUCTION OF LV FUNCTION    Current Outpatient Medications  Medication Sig Dispense Refill  . acetaminophen (TYLENOL) 325 MG tablet Take 325 mg by mouth 2 (two) times daily as needed for moderate pain or headache.    . ARIPiprazole (ABILIFY) 15 MG tablet TAKE 1 TABLET BY MOUTH EVERY DAY 90 tablet 1  . aspirin EC 81 MG tablet Take 81 mg by mouth at bedtime.     Marland Kitchen atorvastatin (LIPITOR) 40 MG tablet TAKE 1 TABLET BY MOUTH EVERY DAY 90 tablet 1  . carvedilol (COREG) 6.25 MG tablet TAKE 1 TABLET (6.25 MG TOTAL) BY MOUTH 2 (TWO) TIMES DAILY WITH A MEAL. 180 tablet  3  . clonazePAM (KLONOPIN) 1 MG tablet TAKE 1 TABLET BY MOUTH TWICE A DAY AS NEEDED *NOT FOR REGULAR USE* 30 tablet 0  . digoxin (LANOXIN) 0.125 MG tablet TAKE 1 TABLET BY MOUTH EVERY DAY 90 tablet 3  . docusate sodium (STOOL SOFTENER) 100 MG capsule Take 100 mg by mouth 2 (two) times daily.    . Ensure (ENSURE) Take 237 mLs by mouth daily as needed. (Patient taking differently: Take 237 mLs by mouth every other day. ) 237 mL 12  . ENTRESTO 97-103 MG TAKE 1 TABLET BY MOUTH TWICE A DAY 180 tablet 3  . FARXIGA 10 MG TABS tablet TAKE 1 TABLET BY MOUTH DAILY BEFORE BREAKFAST. 90 tablet 3  . furosemide (LASIX) 20 MG tablet Take 20 mg by mouth 3 (three) times a week. Mon, Thur, Sat    . levothyroxine (SYNTHROID) 175 MCG tablet TAKE 1 TABLET (175 MCG TOTAL) BY MOUTH DAILY BEFORE BREAKFAST.  90 tablet 3  . mexiletine (MEXITIL) 150 MG capsule Take 2 capsules (300 mg total) by mouth 2 (two) times daily. 180 capsule 3  . ranolazine (RANEXA) 500 MG 12 hr tablet TAKE 1 TABLET BY MOUTH TWICE A DAY **NEED OFFICE VISIT FOR FUTURE REFILLS** 180 tablet 3  . Salicylic Acid (WART REMOVER EX) Apply 1 application topically daily as needed (foot corns).    . sertraline (ZOLOFT) 50 MG tablet TAKE 1 TABLET BY MOUTH EVERY DAY 90 tablet 2   No current facility-administered medications for this visit.    Allergies  Allergen Reactions  . Oxycodone Other (See Comments)    Gives patient nightmares  . Rifampin Nausea Only      Review of Systems negative except from HPI and PMH  Physical Exam BP 110/66   Pulse (!) 57   Ht 6\' 1"  (1.854 m)   Wt 182 lb 9.6 oz (82.8 kg)   SpO2 95%   BMI 24.09 kg/m  Well developed and well nourished in no acute distress HENT normal Neck supple with JVP-flat Clear Device pocket well healed; without hematoma or erythema.  There is no tethering  Regular rate and rhythm, no   gallop 2/6 murmur Abd-soft with active BS No Clubbing cyanosis   edema Skin-warm and dry A & Oriented   Grossly normal sensory and motor function  ECG sinus @ 58 19/15/40 IVCD     Assessment and  Plan  Aortic Valve Disease with valvotomy, Bentall 2011 with redo Bentall 2015  Ventricular tachycarda  ICD St Jude  The patient's device was interrogated.  The information was reviewed. No changes were made in the programming.      NICM  CHF chronic systolic  Euvolemic continue current meds  No intercurrent Ventricular tachycardia-sustained-- continue ranolazine and mexiletine-- at next visit will decrease dose to 400 mg divided doses.   Will check renal function and dig level/K

## 2020-04-14 LAB — BASIC METABOLIC PANEL
BUN/Creatinine Ratio: 18 (ref 10–24)
BUN: 21 mg/dL (ref 8–27)
CO2: 27 mmol/L (ref 20–29)
Calcium: 9.8 mg/dL (ref 8.6–10.2)
Chloride: 105 mmol/L (ref 96–106)
Creatinine, Ser: 1.2 mg/dL (ref 0.76–1.27)
GFR calc Af Amer: 74 mL/min/{1.73_m2} (ref >59)
GFR calc non Af Amer: 64 mL/min/{1.73_m2} (ref >59)
Glucose: 70 mg/dL (ref 65–99)
Potassium: 5.1 mmol/L (ref 3.5–5.2)
Sodium: 143 mmol/L (ref 134–144)

## 2020-04-14 LAB — DIGOXIN LEVEL: Digoxin, Serum: 1 ng/mL — ABNORMAL HIGH (ref 0.5–0.9)

## 2020-04-16 NOTE — Progress Notes (Signed)
EPIC Encounter for ICM Monitoring  Patient Name: Bradley [name] is a 64 y.o. male Date: 04/16/2020 Primary Care Physican: [name], MD Heart Failure Clinic:[facility] Electrophysiologist: Caryl Comes Nephrologist:Deterding 6/2/2021Weight172lbs  Transmission reviewed.   CorVue thoracic impedance suggesting normal fluid levels.  Prescribed:Furosemide 20 mgTake 20 mg by mouth 3 (three) times a week. Rolene Course, Sat  Labs: 04/13/2020 Creatinine 1.20, BUN 21, Potassium 5.1, Sodium 143, GFR 64-74 A complete set of results can be found in Results Review.  Recommendations: No changes  Follow-up plan: ICM clinic phone appointment on10/18/2021. 91 day device clinic remote transmission11/05/2020.   EP/Cardiology Office Visits: Recall for 09/28/2020 with Dr Haroldine Laws.  Recall for 04/13/2021 with Dr Caryl Comes.    Copy of ICM check sent to Dr. Caryl Comes.    3 month ICM trend: 04/13/2020    1 Year ICM trend:       [name], RN 04/16/2020 12:49 PM

## 2020-04-21 ENCOUNTER — Telehealth: Payer: Self-pay

## 2020-04-21 NOTE — Telephone Encounter (Signed)
-----   Message from Deboraha Sprang, MD sent at 04/21/2020  4:41 PM EDT ----- Please Inform Patient that labs are normal w dig at upper limit  Will check with DB as to thoughts  Thanks DAN  dig level is 1.0   my inclination is to decrease- yours ?

## 2020-04-21 NOTE — Telephone Encounter (Signed)
Spoke with pt's wife, DPR and advised pt's labs are normal.  Digoxin level in the upper limits of normal and will contact pt if any medication changes are needed once Dr Caryl Comes consults with Dr Haroldine Laws.  Pt's wife verbalizes understanding and agrees with current plan.

## 2020-04-22 ENCOUNTER — Telehealth: Payer: Self-pay

## 2020-04-22 MED ORDER — DIGOXIN 125 MCG PO TABS: ORAL_TABLET | ORAL | 3 refills | Status: DC

## 2020-04-22 NOTE — Telephone Encounter (Signed)
[place]ke with pt's wife, [name], [place], and advised per Dr Haroldine Laws pt will need to take 1/2 tablet of his 0.125mg  Digoxin.  Pt's wife verbalizes understanding and agrees with current plan.

## 2020-04-22 NOTE — Telephone Encounter (Signed)
-----   Message from Jolaine Artist, MD sent at 04/21/2020  8:12 PM EDT ----- Yes. Lets cut the dose in half please. (and thank you)   ----- Message ----- From: Deboraha Sprang, MD Sent: 04/21/2020   4:41 PM EDT To: Jolaine Artist, MD, Emily Filbert, RN, #  Please Inform Patient that labs are normal w dig at upper limit  Will check with DB as to thoughts  Thanks DAN  dig level is 1.0   my inclination is to decrease- yours ?

## 2020-04-28 ENCOUNTER — Other Ambulatory Visit: Payer: Self-pay | Admitting: Internal Medicine

## 2020-04-28 ENCOUNTER — Telehealth: Payer: Medicare HMO

## 2020-04-28 NOTE — Telephone Encounter (Signed)
Pt's pharmacy is requesting a refill on levothyroxine. Would Dr. Ross like to refill this medication? Please address °

## 2020-04-28 NOTE — Chronic Care Management (AMB) (Deleted)
Chronic Care Management Pharmacy  Name: Bradley [name]  MRN: [id number] DOB: January 31, 1956  Initial Questions: 1. Have you seen any other providers since your last visit? Yes  2. Any changes in your medicines or health? No   Chief Complaint/ HPI  Bradley [name],  64 y.o. , male presents for their Follow-Up CCM visit with the clinical pharmacist via telephone due to COVID-19 Pandemic.  PCP : [name], MD  Their chronic conditions include: Depression/ mood disorder, systolic and diastolic HF, Ventricular tachycardia, HTN, History of MI, Hypothyroidism, Pain, CKD stage II   Office Visits: 02/13/20 [name], MD: Patient presented for 2 month follow up for restless sleep. Plan to continue current medications as PHQ9 score improved from 24 to 12 at this visit. Recommended avoiding daytime naps or limiting to 1 nap per day. Follow up for TSH in 3 months.   12/12/2019- [name], MD- Patient presented for office visit for right inguinal bulge and mood disorder. Patient referred to general surgeon for hernia. Recommend cardiology clearance for any elective surgeries. Abilify increased to 15mg  nightly and continue sertraline. Patient may use clonazepam for severe insomnia. Patient recommended office visit follow up in 6 to 8 weeks to reassess.   11/21/2019- [name], MD- patient presented for office visit for recent head injury needing suture removal. Patient reported increase in depression symptoms. 3 sutures were removed from left side of face. Recommended trial of Abilify 10mg  nightly and follow up in 3 weeks and consider further titration.   Consult Visit: 04/13/20 [name], MD (cardiology): Patient presented for follow up for implantable defibrillator. Digoxin level checked and was high - decreased dose by 1/2 tablet. BMP was normal. Will decrease ranolazine at next visit to 400 mg in divided doses.  12/30/19 [name], MD (cardiology): Unable to access  notes.  11/14/2019- ED visit- patient presented with head injury and laceration. CT negative for acute trauma pathology. Laceration repaired. Patient to follow up with PCP for suture removal in 1 week.   03/01/20210 Cardiology- [name], PA-  patient presented for office visit for follow up. Patient presented as stable. Patient not candidate for Barostim, and would be candiate for CRT. Patient to follow up as scheduled.   Medications: Outpatient Encounter Medications as of 04/28/2020  Medication Sig  . acetaminophen (TYLENOL) 325 MG tablet Take 325 mg by mouth 2 (two) times daily as needed for moderate pain or headache.  . ARIPiprazole (ABILIFY) 15 MG tablet TAKE 1 TABLET BY MOUTH EVERY DAY  . aspirin EC 81 MG tablet Take 81 mg by mouth at bedtime.   Marland Kitchen atorvastatin (LIPITOR) 40 MG tablet TAKE 1 TABLET BY MOUTH EVERY DAY  . carvedilol (COREG) 6.25 MG tablet TAKE 1 TABLET (6.25 MG TOTAL) BY MOUTH 2 (TWO) TIMES DAILY WITH A MEAL.  . clonazePAM (KLONOPIN) 1 MG tablet TAKE 1 TABLET BY MOUTH TWICE A DAY AS NEEDED *NOT FOR REGULAR USE*  . digoxin (LANOXIN) 0.125 MG tablet Take 1/2 tablet by mouth daily  . docusate sodium (STOOL SOFTENER) 100 MG capsule Take 100 mg by mouth 2 (two) times daily.  . Ensure (ENSURE) Take 237 mLs by mouth daily as needed. (Patient taking differently: Take 237 mLs by mouth every other day. )  . ENTRESTO 97-103 MG TAKE 1 TABLET BY MOUTH TWICE A DAY  . FARXIGA 10 MG TABS tablet TAKE 1 TABLET BY MOUTH DAILY BEFORE BREAKFAST.  . furosemide (LASIX) 20 MG tablet Take 20 mg by  mouth 3 (three) times a week. Mon, Thur, Sat  . levothyroxine (SYNTHROID) 175 MCG tablet TAKE 1 TABLET (175 MCG TOTAL) BY MOUTH DAILY BEFORE BREAKFAST.  Marland Kitchen mexiletine (MEXITIL) 150 MG capsule Take 2 capsules (300 mg total) by mouth 2 (two) times daily.  . ranolazine (RANEXA) 500 MG 12 hr tablet TAKE 1 TABLET BY MOUTH TWICE A DAY **NEED OFFICE VISIT FOR FUTURE REFILLS**  . Salicylic Acid (WART  REMOVER EX) Apply 1 application topically daily as needed (foot corns).  . sertraline (ZOLOFT) 50 MG tablet TAKE 1 TABLET BY MOUTH EVERY DAY   No facility-administered encounter medications on file as of 04/28/2020.    Current Diagnosis/Assessment:  Goals Addressed   None       Depression/ mood disorder  Patient reported having blurred vision since dose of Abilify was increased on 12/12/2019. Other than that, he states he is doing well.   Patient is currently controlled on the following medications:  . Sertraline 50mg , 1 tablet once daily . Aripiprazole (Abilify) 15mg , 1 tablet once daily  . Clonazepam 1mg , 1 tablet twice daily as needed (will take about 3x per week) only when anxiety is high.    Plan Recommend monitoring for lipid panel, fasting glucose/ A1c (3 months after starting Abilify)  Consulted with Dr. Elease Hashimoto in regards to Abilify and blurred vision. Agreed to referral to ophthalmology.  -did he see them?  Heart Failure   Type: Combined Systolic and Diastolic  Last ejection fraction: 30 to 35% (07/09/2019)   NYHA Class: II (slight limitation of activity)- only when humid and hot and going up stairs.   Digoxin: 0.8 (04/04/2019)   Patient has failed these meds in past: lisinopril, spironolatone (low BP and hyperkalemia)  Patient is currently controlled on the following medications:   Carvedilol 6.25mg , 1 tablet twice daily with a meal   Digoxin 0.125mg , 1 tablet once daily   Entresto 97-103mg , 1 tablet twice daily   Furosemide 20mg , 1 tablet four times a week (takes 2x or sometimes 3x per week)   Farxiga 10mg , 1 tablet once daily before breakfast   We discussed weighing daily; if you gain more than 3 pounds in one day or 5 pounds in one week call your doctor  - patient reports weight is stable at 176 lbs.     Plan Managed by cardiology.  Continue current medications   Ventricular tachycardia   Patient is currently controlled on the following  medications:  . mexiletine 150mg , 2 capsule twice daily . Ranolazine 500mg , 1 tablet twice daily   Plan Managed by cardiology.  Continue current medications  Hypertension  Denies dizziness/lightheadedness. Endorses sx only when over exerting himself.   Office blood pressures are  BP Readings from Last 3 Encounters:  04/13/20 110/66  04/01/20 (!) 106/56  02/13/20 122/64   Patient checks BP at home every other day  Patient home BP readings are ranging: 109-117/ 60-68  Patient is controlled on:   Carvedilol 6.25mg , 1 tablet twice daily with a meal   Plan Continue current medications   History of MI    Lipid Panel     Component Value Date/Time   CHOL 132 06/11/2019 1040   CHOL 139 08/27/2017 1205   TRIG 77.0 06/11/2019 1040   HDL 49.80 06/11/2019 1040   HDL 60 08/27/2017 1205   LDLCALC 67 06/11/2019 1040   LDLCALC 70 08/27/2017 1205     The 10-year ASCVD risk score Mikey Bussing DC Jr., et al., 2013) is: 7.1%  Values used to calculate the score:     Age: 48 years     Sex: Male     Is Non-Hispanic African American: No     Diabetic: No     Tobacco smoker: No     Systolic Blood Pressure: 431 mmHg     Is BP treated: Yes     HDL Cholesterol: 49.8 mg/dL     Total Cholesterol: 132 mg/dL   Patient is currently controlled on the following medications:  . Aspirin 81mg , 1 tablet once daily  . Atorvastatin 40mg , 1 tablet every day (takes every other day)   We discussed:  - taking medications as indicated. Spouse reported giving him atorvastatin every other night  Plan Recommend repeat lipid panel with initiation of Abilify.  Continue current medications  Hypothyroidism   Lab Results  Component Value Date/Time   TSH 1.75 06/11/2019 10:40 AM   TSH 2.57 05/29/2018 03:13 PM   TSH 0.696 08/27/2017 12:05 PM   .Lenoria Farrier Patient is currently controlled on the following medications:  . Levothyroxine 131mcg, 1 tablet once daily before breakfast   Plan Continue current  medications  Pain   Patient is currently controlled on the following medications:   APAP 325mg , 1 tablet twice daily as needed for moderate pain or headache   Plan Continue current medications  CKD, Stage II   Kidney Function Lab Results  Component Value Date/Time   CREATININE 1.20 04/13/2020 04:09 PM   CREATININE 1.12 06/11/2019 10:40 AM   CREATININE 1.28 10/14/2015 12:28 PM   CREATININE 1.21 07/12/2015 12:07 PM   GFR 66.36 06/11/2019 10:40 AM   GFRNONAA 64 04/13/2020 04:09 PM   GFRAA 74 04/13/2020 04:09 PM   Kidney function stable.   Plan Continue to monitor and adjust medications as needed.    Vaccines   Reviewed and discussed patient's vaccination history.    Immunization History  Administered Date(s) Administered  . Influenza Split 08/18/2011  . Influenza,inj,Quad PF,6+ Mos 09/11/2013, 03/19/2014, 03/21/2017, 05/29/2018  . PFIZER SARS-COV-2 Vaccination 09/29/2019, 10/20/2019  . Pneumococcal Conjugate-13 06/08/2014  . Td 07/31/2004  . Tdap 03/06/2016    Plan Sending message to CMA to add COVID vaccine information to patient's chart.    Medication Management  Patient organizes medications: Managed by spouse. Organized in pill box (AM and PM)  [facility]  Adherence: no gaps in refill history (per medication dispense report from 06/27/19 to 12/24/19)     Medication Management   Pt uses *** pharmacy for all medications Uses pill box? {Yes or If no, why not?:20788} Pt endorses ***% compliance  We discussed: {Pharmacy options:24294}  Plan  {US Pharmacy VQMG:86761}   Follow up: *** month phone visit  Jeni Salles, PharmD Clinical Pharmacist [facility] [phone]

## 2020-04-29 NOTE — Telephone Encounter (Signed)
Patient is followedin HF clinic now.  Please defer to PCP.

## 2020-05-03 ENCOUNTER — Other Ambulatory Visit: Payer: Self-pay | Admitting: Internal Medicine

## 2020-05-05 ENCOUNTER — Other Ambulatory Visit: Payer: Self-pay | Admitting: Internal Medicine

## 2020-05-05 DIAGNOSIS — I10 Essential (primary) hypertension: Secondary | ICD-10-CM

## 2020-05-07 ENCOUNTER — Other Ambulatory Visit: Payer: Self-pay | Admitting: Family Medicine

## 2020-05-07 DIAGNOSIS — I5042 Chronic combined systolic (congestive) and diastolic (congestive) heart failure: Secondary | ICD-10-CM | POA: Diagnosis not present

## 2020-05-07 MED ORDER — LEVOTHYROXINE SODIUM 175 MCG PO TABS: 175.0000 ug | ORAL_TABLET | Freq: Every day | ORAL | 0 refills | Status: DC

## 2020-05-07 NOTE — Addendum Note (Signed)
Addended by: Aviva Signs M on: 05/07/2020 01:17 PM   Modules accepted: Orders

## 2020-05-07 NOTE — Telephone Encounter (Signed)
[name] contacted and informed that an appointment was needed.   Appt has been made for 11/1. 30 day supply has been sent in as requested.

## 2020-05-07 NOTE — Telephone Encounter (Signed)
Pt wife call and stated he is out of his levothyroxine (SYNTHROID) 175 MCG tablet and want a refill sent to  [id number] - [place], [place] - [address] Phone:  [phone]  Fax:  [phone]

## 2020-05-17 ENCOUNTER — Ambulatory Visit (INDEPENDENT_AMBULATORY_CARE_PROVIDER_SITE_OTHER): Payer: Medicare Other

## 2020-05-17 DIAGNOSIS — I5042 Chronic combined systolic (congestive) and diastolic (congestive) heart failure: Secondary | ICD-10-CM

## 2020-05-17 DIAGNOSIS — Z95810 Presence of automatic (implantable) cardiac defibrillator: Secondary | ICD-10-CM | POA: Diagnosis not present

## 2020-05-18 NOTE — Progress Notes (Signed)
EPIC Encounter for ICM Monitoring  Patient Name: Bradley [name] is a 64 y.o. male Date: 05/18/2020 Primary Care Physican: [name], MD Heart Failure Clinic:[facility] Electrophysiologist: Caryl Comes Nephrologist:Deterding 04/13/2020 OfficeWeight182lbs  Transmission reviewed.  CorVue thoracic impedance suggesting normal fluid levels.  Prescribed:Furosemide 20 mgTake 20 mg by mouth 3 (three) times a week. Rolene Course, Sat  Labs: 04/13/2020 Creatinine 1.20, BUN 21, Potassium 5.1, Sodium 143, GFR 64-74 A complete set of results can be found in Results Review.  Recommendations: No changes  Follow-up plan: ICM clinic phone appointment on11/22/2021. 91 day device clinic remote transmission11/05/2020.   EP/Cardiology Office Visits:Recall for 09/28/2020 with Dr Haroldine Laws.  Recall for 04/13/2021 with Dr Caryl Comes.   Copy of ICM check sent to [place].   3 month ICM trend: 05/17/2020    1 Year ICM trend:       [name], RN 05/18/2020 10:31 AM

## 2020-05-30 ENCOUNTER — Other Ambulatory Visit: Payer: Self-pay | Admitting: Family Medicine

## 2020-05-31 ENCOUNTER — Other Ambulatory Visit: Payer: Self-pay

## 2020-05-31 ENCOUNTER — Encounter: Payer: Self-pay | Admitting: Family Medicine

## 2020-05-31 ENCOUNTER — Ambulatory Visit (INDEPENDENT_AMBULATORY_CARE_PROVIDER_SITE_OTHER): Payer: Medicare Other | Admitting: Family Medicine

## 2020-05-31 VITALS — BP 112/76 | HR 58 | Temp 98.1°F | Ht 72.0 in | Wt 190.1 lb

## 2020-05-31 DIAGNOSIS — I252 Old myocardial infarction: Secondary | ICD-10-CM

## 2020-05-31 DIAGNOSIS — Z23 Encounter for immunization: Secondary | ICD-10-CM | POA: Diagnosis not present

## 2020-05-31 DIAGNOSIS — E039 Hypothyroidism, unspecified: Secondary | ICD-10-CM

## 2020-05-31 NOTE — Progress Notes (Signed)
Established Patient Office Visit  Subjective:  Patient ID: Bradley [name], male    DOB: March 02, 1956  Age: 64 y.o. MRN: [id number]  CC:  Chief Complaint  Patient presents with  . Medication Refill    HPI Bradley [name] presents for medical follow-up.  Bradley [name] has history of multiple medical problems including history of combined systolic and diastolic heart failure, nonischemic cardiomyopathy, hypertension, hypothyroidism, chronic kidney disease stage II, generalized anxiety disorder, history of inferior wall MI, history of recurrent depression.  Bradley [name] was told Bradley [name] need to come in for follow-up labs.  Bradley [name] had recent digoxin and basic metabolic panel through cardiology.  Digoxin level was 1.0 and Bradley [name] dosage was cut in half.  Electrolytes were stable.  Bradley [name] is compliant with Bradley [name] thyroid medication.  Bradley [name] takes Bradley [name] Lipitor 4 days/week.  No recent chest pains.  No dizziness.  Recent addition of Farxiga for Bradley [name] heart failure.  No history of diabetes.  Past Medical History:  Diagnosis Date  . Anxiety   . Aortic stenosis    s/p Bentall with bioprosthetic AVR 02/2010; Last echo (9/11): Moderate LVH, EF 45-50%, AVR functioning appropriately, aortic valve mean gradient 21, diastolic dysfunction. Chest MRA (2/13): Mild to moderate dilatation at the sinus of Valsalva at 4.1 cm, mild dilatation ascending aorta distal to the tube graft at 3.9 cm, moderate dilatation of the innominate artery a 2.1 cm;    . CHF (congestive heart failure) ([place])   . Depression   . ESRD on dialysis [facility])    a. 09/2013 felt to be related to gentamycin b. no longer requiring HD  . Hx of cardiac cath    a. LHC in 02/2010: normal cors  . Hx of echocardiogram 02/2014   Echo (8/15):  Mod LV, EF 35-40%, Gr 1 DD, AVR ok (mean 14 mmHg), mild LAE  . Hypertension   . Hypothyroidism, postsurgical   . Prosthetic valve endocarditis ([place])   . Staphylococcus aureus bacteremia   . Thyroid cancer ([place])   . Ventricular tachycardia ([place])    a. s/p STJ  ICD    Past Surgical History:  Procedure Laterality Date  . AORTIC VALVE REPAIR  1968  . AORTIC VALVE REPLACEMENT  2011  . AV FISTULA PLACEMENT Left 10/08/2013   Procedure: ARTERIOVENOUS (AV) FISTULA CREATION- LEFT ARM; Radial Cephalic ;  Surgeon: Mal [name], MD;  Location: [place];  Service: Vascular;  Laterality: Left;  . BENTALL PROCEDURE N/A 10/28/2013   Procedure: REDO BENTALL PROCEDURE, debridment of aoritc root abscess, replacement of aortic root, ascending aorta and aortic valve with homograft. Insertion of left femoral arterial line;  Surgeon: [name], MD;  Location: [place];  Service: Open Heart Surgery;  Laterality: N/A;  . BIOPSY  07/22/2019   Procedure: BIOPSY;  Surgeon: Doran Stabler, MD;  Location: [facility];  Service: Gastroenterology;;  . CARDIAC CATHETERIZATION  03/02/2010   NORMAL CORONARY ARTERY  . CARDIAC CATHETERIZATION N/A 01/01/2015   Procedure: Coronary/Graft Angiography;  Surgeon: Troy Sine, MD;  Location: [place] [facility];  Service: Cardiovascular;  Laterality: N/A;  . CARDIAC VALVE REPLACEMENT    . COLONOSCOPY WITH PROPOFOL N/A 07/22/2019   Procedure: COLONOSCOPY WITH PROPOFOL;  Surgeon: Doran Stabler, MD;  Location: [facility];  Service: Gastroenterology;  Laterality: N/A;  . EP IMPLANTABLE DEVICE N/A 01/01/2015   Procedure: ICD Implant;  Surgeon: Deboraha Sprang, MD;  Location: [place] [facility];  Service: Cardiovascular;  Laterality: N/A;  .  HEMOSTASIS CLIP PLACEMENT  07/22/2019   Procedure: HEMOSTASIS CLIP PLACEMENT;  Surgeon: Doran Stabler, MD;  Location: [facility];  Service: Gastroenterology;;  . INTRAOPERATIVE TRANSESOPHAGEAL ECHOCARDIOGRAM N/A 10/28/2013   Procedure: INTRAOPERATIVE TRANSESOPHAGEAL ECHOCARDIOGRAM;  Surgeon: [name], MD;  Location: [place];  Service: Open Heart Surgery;  Laterality: N/A;  . PERIPHERAL VASCULAR CATHETERIZATION N/A 01/01/2015   Procedure: Aortic Arch Angiography;  Surgeon: Troy Sine,  MD;  Location: [place] [facility];  Service: Cardiovascular;  Laterality: N/A;  . POLYPECTOMY  07/22/2019   Procedure: POLYPECTOMY;  Surgeon: Doran Stabler, MD;  [facility];  Service: Gastroenterology;;  . SHOULDER ARTHROSCOPY W/ ROTATOR CUFF REPAIR Right 2012  . STERNOTOMY     REDO  . TEE WITHOUT CARDIOVERSION N/A 10/24/2013   Procedure: TRANSESOPHAGEAL ECHOCARDIOGRAM (TEE);  Surgeon: Dorothy Spark, MD;  Location[facility];  Service: Cardiovascular;  Laterality: N/A;  . THYROIDECTOMY  ~ 2005  . TRANSTHORACIC ECHOCARDIOGRAM  03/2010   SHOWED MILD REDUCTION OF LV FUNCTION    Family History  Problem Relation Age of Onset  . Hypertension Mother   . Heart disease Father   . Stroke Father   . Hypertension Father   . Thyroid cancer Sister   . Thyroid cancer Sister   . Kidney Stones Sister   . Heart attack Neg Hx   . Colon cancer Neg Hx   . Colon polyps Neg Hx   . Esophageal cancer Neg Hx   . Stomach cancer Neg Hx   . Rectal cancer Neg Hx     Social History   Socioeconomic History  . Marital status: Married    Spouse name: Not on file  . Number of children: Not on file  . Years of education: Not on file  . Highest education level: Not on file  Occupational History  . Not on file  Tobacco Use  . Smoking status: Never Smoker  . Smokeless tobacco: Never Used  Vaping Use  . Vaping Use: Never used  Substance and Sexual Activity  . Alcohol use: No    Alcohol/week: 0.0 standard drinks    Comment: Occasional  . Drug use: No  . Sexual activity: Never  Other Topics Concern  . Not on file  Social History Narrative   Lives at home with walker.  Does not drive.  ESRD T, H, [employer].  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 Difficulty of Paying Living Expenses: Not hard at all  Food Insecurity:   . Worried About Charity fundraiser in the Last Year: Not on file  . Ran Out of Food in the Last  Year: Not on file  Transportation Needs: No Transportation Needs  . Lack of Transportation (Medical): No  . Lack of Transportation (Non-Medical): No  Physical Activity:   . Days of Exercise per Week: Not on file  . Minutes of Exercise per Session: Not on file  Stress:   . Feeling of Stress : Not on file  Social Connections:   . Frequency of Communication with Friends and Family: Not on file  . Frequency of Social Gatherings with Friends and Family: Not on file  . Attends Religious Services: Not on file  . Active Member of Clubs or Organizations: Not on file  . Attends Archivist Meetings: Not on file  . Marital Status: Not on file  Intimate Partner Violence:   .  Fear of Current or Ex-Partner: Not on file  . Emotionally Abused: Not on file  . Physically Abused: Not on file  . Sexually Abused: Not on file    Outpatient Medications Prior to Visit  Medication Sig Dispense Refill  . acetaminophen (TYLENOL) 325 MG tablet Take 325 mg by mouth 2 (two) times daily as needed for moderate pain or headache.    . ARIPiprazole (ABILIFY) 15 MG tablet TAKE 1 TABLET BY MOUTH EVERY DAY 90 tablet 1  . aspirin EC 81 MG tablet Take 81 mg by mouth at bedtime.     Marland Kitchen atorvastatin (LIPITOR) 40 MG tablet TAKE 1 TABLET BY MOUTH EVERY DAY 90 tablet 1  . carvedilol (COREG) 6.25 MG tablet TAKE 1 TABLET (6.25 MG TOTAL) BY MOUTH 2 (TWO) TIMES DAILY WITH A MEAL. 180 tablet 3  . clonazePAM (KLONOPIN) 1 MG tablet TAKE 1 TABLET BY MOUTH TWICE A DAY AS NEEDED *NOT FOR REGULAR USE* 30 tablet 0  . digoxin (LANOXIN) 0.125 MG tablet Take 1/2 tablet by mouth daily 90 tablet 3  . docusate sodium (STOOL SOFTENER) 100 MG capsule Take 100 mg by mouth 2 (two) times daily.    . Ensure (ENSURE) Take 237 mLs by mouth daily as needed. (Patient taking differently: Take 237 mLs by mouth every other day. ) 237 mL 12  . ENTRESTO 97-103 MG TAKE 1 TABLET BY MOUTH TWICE A DAY 180 tablet 3  . FARXIGA 10 MG TABS tablet TAKE 1  TABLET BY MOUTH DAILY BEFORE BREAKFAST. 90 tablet 3  . furosemide (LASIX) 20 MG tablet TAKE 1 TABLET (20 MG TOTAL) BY MOUTH 4 (FOUR) TIMES A WEEK. 45 tablet 3  . levothyroxine (SYNTHROID) 175 MCG tablet TAKE 1 TABLET BY MOUTH DAILY BEFORE BREAKFAST. 30 tablet 0  . mexiletine (MEXITIL) 150 MG capsule Take 2 capsules (300 mg total) by mouth 2 (two) times daily. 180 capsule 3  . ranolazine (RANEXA) 500 MG 12 hr tablet TAKE 1 TABLET BY MOUTH TWICE A DAY **NEED OFFICE VISIT FOR FUTURE REFILLS** 180 tablet 3  . Salicylic Acid (WART REMOVER EX) Apply 1 application topically daily as needed (foot corns).    . sertraline (ZOLOFT) 50 MG tablet TAKE 1 TABLET BY MOUTH EVERY DAY 90 tablet 2   No facility-administered medications prior to visit.    Allergies  Allergen Reactions  . Oxycodone Other (See Comments)    Gives patient nightmares  . Rifampin Nausea Only    ROS Review of Systems  Constitutional: Negative for fatigue.  Eyes: Negative for visual disturbance.  Respiratory: Negative for cough, chest tightness and shortness of breath.   Cardiovascular: Negative for chest pain, palpitations and leg swelling.  Neurological: Negative for dizziness, syncope, weakness, light-headedness and headaches.      Objective:    Physical Exam Vitals reviewed.  Constitutional:      Appearance: Normal appearance.  Cardiovascular:     Rate and Rhythm: Normal rate and regular rhythm.  Pulmonary:     Effort: Pulmonary effort is normal.     Breath sounds: Normal breath sounds.  Musculoskeletal:     Right lower leg: No edema.     Left lower leg: No edema.  Neurological:     Mental Status: Bradley [name] is alert.     BP 112/76 (BP Location: Left Arm, Patient Position: Sitting, Cuff Size: Normal)   Pulse (!) 58   Temp 98.1 F (36.7 C) (Oral)   Ht 6' (1.829 m)   Wt 190 lb 1.6  oz (86.2 kg)   SpO2 98%   BMI 25.78 kg/m  Wt Readings from Last 3 Encounters:  05/31/20 190 lb 1.6 oz (86.2 kg)  04/13/20 182 lb  9.6 oz (82.8 kg)  04/01/20 182 lb (82.6 kg)     Health Maintenance Due  Topic Date Due  . HIV Screening  Never done  . INFLUENZA VACCINE  02/29/2020    There are no preventive care reminders to display for this patient.  Lab Results  Component Value Date   TSH 1.75 06/11/2019   Lab Results  Component Value Date   WBC 6.2 06/11/2019   HGB 14.0 06/11/2019   HCT 42.6 06/11/2019   MCV 95.9 06/11/2019   PLT 112.0 (L) 06/11/2019   Lab Results  Component Value Date   NA 143 04/13/2020   K 5.1 04/13/2020   CO2 27 04/13/2020   GLUCOSE 70 04/13/2020   BUN 21 04/13/2020   CREATININE 1.20 04/13/2020   BILITOT 0.4 06/11/2019   ALKPHOS 48 06/11/2019   AST 19 06/11/2019   ALT 18 06/11/2019   PROT 6.2 06/11/2019   ALBUMIN 4.0 06/11/2019   CALCIUM 9.8 04/13/2020   ANIONGAP 5 02/20/2018   GFR 66.36 06/11/2019   Lab Results  Component Value Date   CHOL 132 06/11/2019   Lab Results  Component Value Date   HDL 49.80 06/11/2019   Lab Results  Component Value Date   LDLCALC 67 06/11/2019   Lab Results  Component Value Date   TRIG 77.0 06/11/2019   Lab Results  Component Value Date   CHOLHDL 3 06/11/2019   Lab Results  Component Value Date   HGBA1C 5.9 (H) 12/30/2014      Assessment & Plan:   Problem List Items Addressed This Visit      Unprioritized   Hypothyroidism (Chronic)   Relevant Orders   TSH   History of acute inferior wall MI   Relevant Orders   Lipid panel   Hepatic function panel    Other Visit Diagnoses    Need for influenza vaccination    -  Primary   Relevant Orders   Flu Vaccine QUAD 6+ mos PF IM (Fluarix Quad PF)    -Check labs including hepatic panel, lipid panel, TSH. -Goal LDL cholesterol less than 70 -Vaccine given -Routine follow-up in 6 months and sooner as needed  No orders of the defined types were placed in this encounter.   Follow-up: No follow-ups on file.    [name], MD

## 2020-06-01 LAB — HEPATIC FUNCTION PANEL
AG Ratio: 1.8 (calc) (ref 1.0–2.5)
ALT: 18 U/L (ref 9–46)
AST: 19 U/L (ref 10–35)
Albumin: 4.2 g/dL (ref 3.6–5.1)
Alkaline phosphatase (APISO): 54 U/L (ref 35–144)
Bilirubin, Direct: 0.2 mg/dL (ref 0.0–0.2)
Globulin: 2.4 g/dL (calc) (ref 1.9–3.7)
Indirect Bilirubin: 0.5 mg/dL (calc) (ref 0.2–1.2)
Total Bilirubin: 0.7 mg/dL (ref 0.2–1.2)
Total Protein: 6.6 g/dL (ref 6.1–8.1)

## 2020-06-01 LAB — LIPID PANEL
Cholesterol: 136 mg/dL (ref <200)
HDL: 52 mg/dL (ref >=40)
LDL Cholesterol (Calc): 68 mg/dL (calc)
Non-HDL Cholesterol (Calc): 84 mg/dL (calc) (ref <130)
Total CHOL/HDL Ratio: 2.6 (calc) (ref <5.0)
Triglycerides: 78 mg/dL (ref <150)

## 2020-06-01 LAB — TSH: TSH: 0.43 mIU/L (ref 0.40–4.50)

## 2020-06-07 DIAGNOSIS — I5042 Chronic combined systolic (congestive) and diastolic (congestive) heart failure: Secondary | ICD-10-CM | POA: Diagnosis not present

## 2020-06-08 DIAGNOSIS — K4020 Bilateral inguinal hernia, without obstruction or gangrene, not specified as recurrent: Secondary | ICD-10-CM | POA: Diagnosis not present

## 2020-06-10 ENCOUNTER — Ambulatory Visit (INDEPENDENT_AMBULATORY_CARE_PROVIDER_SITE_OTHER): Payer: Medicare Other

## 2020-06-10 DIAGNOSIS — I428 Other cardiomyopathies: Secondary | ICD-10-CM

## 2020-06-10 LAB — CUP PACEART REMOTE DEVICE CHECK
Battery Remaining Longevity: 47 mo
Battery Remaining Percentage: 46 %
Battery Voltage: 2.89 V
Brady Statistic RV Percent Paced: 1 %
Date Time Interrogation Session: 20211111033308
HighPow Impedance: 71 Ohm
HighPow Impedance: 71 Ohm
Implantable Lead Implant Date: 20160603
Implantable Lead Location: 753860
Implantable Lead Model: 7122
Implantable Pulse Generator Implant Date: 20160603
Lead Channel Impedance Value: 360 Ohm
Lead Channel Pacing Threshold Amplitude: 1.25 V
Lead Channel Pacing Threshold Pulse Width: 0.7 ms
Lead Channel Sensing Intrinsic Amplitude: 11.7 mV
Lead Channel Setting Pacing Amplitude: 2.5 V
Lead Channel Setting Pacing Pulse Width: 0.5 ms
Lead Channel Setting Sensing Sensitivity: 0.5 mV
Pulse Gen Serial Number: 7135169

## 2020-06-11 NOTE — Progress Notes (Signed)
Remote ICD transmission.   

## 2020-06-18 ENCOUNTER — Other Ambulatory Visit: Payer: Self-pay | Admitting: Family Medicine

## 2020-06-21 ENCOUNTER — Ambulatory Visit (INDEPENDENT_AMBULATORY_CARE_PROVIDER_SITE_OTHER): Payer: Medicare Other

## 2020-06-21 DIAGNOSIS — I5042 Chronic combined systolic (congestive) and diastolic (congestive) heart failure: Secondary | ICD-10-CM

## 2020-06-21 DIAGNOSIS — Z95810 Presence of automatic (implantable) cardiac defibrillator: Secondary | ICD-10-CM | POA: Diagnosis not present

## 2020-06-22 ENCOUNTER — Other Ambulatory Visit: Payer: Self-pay | Admitting: Family Medicine

## 2020-06-23 NOTE — Progress Notes (Signed)
EPIC Encounter for ICM Monitoring  Patient Name: Bradley [name] is a 64 y.o. male Date: 06/23/2020 Primary Care Physican: [name], MD Heart Failure Clinic:[facility] Electrophysiologist: Caryl Comes Nephrologist:Deterding 06/23/2020 [id number]  Spoke with wife and reports patient is feeling well at this time.  Denies fluid symptoms.    CorVue thoracic impedancesuggesting normal fluid levels.  Prescribed:Furosemide 20 mgTake 20 mg by mouth 3 (three) times a week. Rolene Course, Sat  Labs: 04/13/2020 Jacelyn Pi, Potassium5.1, [id number], [id number] A complete set of results can be found in Results Review.  Recommendations: No changes and encouraged to call if experiencing any fluid symptoms.  Follow-up plan: ICM clinic phone appointment on12/27/2021. 91 day device clinic remote transmission2/04/2021.   EP/Cardiology Office Visits:Recall for 09/28/2020 with Dr Haroldine Laws. Recall for 9/14/2022with Dr Caryl Comes.   Copy of ICM check sent to [facility] [facility].   3 month ICM trend: 06/21/2020    1 Year ICM trend:       [name], RN 06/23/2020 8:01 AM

## 2020-06-28 ENCOUNTER — Other Ambulatory Visit: Payer: Self-pay | Admitting: Family Medicine

## 2020-07-01 ENCOUNTER — Other Ambulatory Visit (HOSPITAL_COMMUNITY): Payer: Self-pay | Admitting: Cardiology

## 2020-07-07 DIAGNOSIS — I5042 Chronic combined systolic (congestive) and diastolic (congestive) heart failure: Secondary | ICD-10-CM | POA: Diagnosis not present

## 2020-07-11 ENCOUNTER — Other Ambulatory Visit: Payer: Self-pay | Admitting: Internal Medicine

## 2020-07-14 ENCOUNTER — Telehealth: Payer: Self-pay | Admitting: Family Medicine

## 2020-07-14 NOTE — Telephone Encounter (Signed)
Tried calling pt to  schedule Medicare Annual Wellness Visit (AWVI) either virtually or in office.  No answer   Last AWV no information  please schedule at anytime with [facility] Nurse Health Advisor 1 or 2   This should be a 45 minute visit.

## 2020-07-26 ENCOUNTER — Ambulatory Visit (INDEPENDENT_AMBULATORY_CARE_PROVIDER_SITE_OTHER): Payer: Medicare Other

## 2020-07-26 DIAGNOSIS — I5042 Chronic combined systolic (congestive) and diastolic (congestive) heart failure: Secondary | ICD-10-CM

## 2020-07-26 DIAGNOSIS — Z95810 Presence of automatic (implantable) cardiac defibrillator: Secondary | ICD-10-CM | POA: Diagnosis not present

## 2020-07-26 NOTE — Progress Notes (Signed)
EPIC Encounter for ICM Monitoring  Patient Name: MALEK SKOG is a 64 y.o. male Date: 07/26/2020 Primary Care Physican: [name], MD Heart Failure Clinic:[facility] Electrophysiologist: Caryl Comes Nephrologist:Deterding 11/24/2021Weight187lbs  Spoke with wife.  She reports he has some stomach bloating and a little puffiness in the face.  She said he has been eating foods higher in salt such as chinese food.   CorVue thoracic impedancesuggesting possible fluid accumulation starting 07/21/2020.  Prescribed:Furosemide 20 mgTake 20 mg by mouth 3 (three) times a week. Rolene Course, Sat  Labs: 04/13/2020 Jacelyn Pi, Potassium5.1, [id number], [id number] A complete set of results can be found in Results Review.  Recommendations: Advised to avoid foods high in salt and restaurant foods.  Advised to take Furosemide additional 2 days this week along with regular prescribed days.   Follow-up plan: ICM clinic phone appointment on1/10/2020 to recheck fluid levels. 91 day device clinic remote transmission2/04/2021.   EP/Cardiology Office Visits:Recall for 09/28/2020 with Dr Haroldine Laws. Recall for 9/14/2022with Dr Caryl Comes.   Copy of ICM check sent to [place].    3 month ICM trend: 07/25/2020    [name], RN 07/26/2020 1:38 PM

## 2020-07-30 ENCOUNTER — Other Ambulatory Visit: Payer: Self-pay | Admitting: Family Medicine

## 2020-08-03 ENCOUNTER — Ambulatory Visit (INDEPENDENT_AMBULATORY_CARE_PROVIDER_SITE_OTHER): Payer: Medicare Other

## 2020-08-03 DIAGNOSIS — I5042 Chronic combined systolic (congestive) and diastolic (congestive) heart failure: Secondary | ICD-10-CM

## 2020-08-03 DIAGNOSIS — Z95810 Presence of automatic (implantable) cardiac defibrillator: Secondary | ICD-10-CM

## 2020-08-06 NOTE — Progress Notes (Signed)
EPIC Encounter for ICM Monitoring  Patient Name: Bradley [name] is a 65 y.o. male Date: 08/06/2020 Primary Care Physican: [name], MD Heart Failure Clinic:[facility] Electrophysiologist: Caryl Comes Nephrologist:Deterding 08/06/2020 [id number]  Spoke with patient and reports feeling well at this time.  Denies fluid symptoms.    CorVue thoracic impedancesuggesting possible fluid accumulation starting 07/21/2020.  Prescribed:Furosemide 20 mgTake 20 mg by mouth 3 (three) times a week. Rolene Course, Sat  Labs: 04/13/2020 Jacelyn Pi, Potassium5.1, [id number], [id number] A complete set of results can be found in Results Review.  Recommendations: Recommendation to limit salt intake.  Encouraged to call if experiencing any fluid symptoms.   Follow-up plan: ICM clinic phone appointment on 09/10/2020. 91 day device clinic remote transmission2/04/2021.   EP/Cardiology Office Visits:Recall for 09/28/2020 with Dr Haroldine Laws. Recall for 9/14/2022with Dr Caryl Comes.   Copy of ICM check sent to [place].   3 month ICM trend: 08/04/2020.    1 Year ICM trend:       [name], RN 08/06/2020 3:03 PM

## 2020-08-07 DIAGNOSIS — I5042 Chronic combined systolic (congestive) and diastolic (congestive) heart failure: Secondary | ICD-10-CM | POA: Diagnosis not present

## 2020-08-23 MED ORDER — MEXILETINE HCL 150 MG PO CAPS: ORAL_CAPSULE | ORAL | 0 refills | Status: DC

## 2020-08-23 NOTE — Addendum Note (Signed)
Addended by: Shela Nevin R on: 4/75/3391 03:26 PM   Modules accepted: Orders

## 2020-08-28 ENCOUNTER — Other Ambulatory Visit: Payer: Self-pay | Admitting: Internal Medicine

## 2020-08-30 ENCOUNTER — Other Ambulatory Visit: Payer: Self-pay | Admitting: Family Medicine

## 2020-09-07 DIAGNOSIS — I5042 Chronic combined systolic (congestive) and diastolic (congestive) heart failure: Secondary | ICD-10-CM | POA: Diagnosis not present

## 2020-09-09 ENCOUNTER — Ambulatory Visit (INDEPENDENT_AMBULATORY_CARE_PROVIDER_SITE_OTHER): Payer: Medicare Other

## 2020-09-09 DIAGNOSIS — I428 Other cardiomyopathies: Secondary | ICD-10-CM

## 2020-09-09 LAB — CUP PACEART REMOTE DEVICE CHECK
Battery Remaining Longevity: 44 mo
Battery Remaining Percentage: 44 %
Battery Voltage: 2.89 V
Brady Statistic RV Percent Paced: 1 %
Date Time Interrogation Session: 20220210020016
HighPow Impedance: 72 Ohm
HighPow Impedance: 72 Ohm
Implantable Lead Implant Date: 20160603
Implantable Lead Location: 753860
Implantable Lead Model: 7122
Implantable Pulse Generator Implant Date: 20160603
Lead Channel Impedance Value: 380 Ohm
Lead Channel Pacing Threshold Amplitude: 1.25 V
Lead Channel Pacing Threshold Pulse Width: 0.7 ms
Lead Channel Sensing Intrinsic Amplitude: 11.7 mV
Lead Channel Setting Pacing Amplitude: 2.5 V
Lead Channel Setting Pacing Pulse Width: 0.5 ms
Lead Channel Setting Sensing Sensitivity: 0.5 mV
Pulse Gen Serial Number: 7135169

## 2020-09-10 ENCOUNTER — Ambulatory Visit (INDEPENDENT_AMBULATORY_CARE_PROVIDER_SITE_OTHER): Payer: Medicare Other

## 2020-09-10 DIAGNOSIS — Z95810 Presence of automatic (implantable) cardiac defibrillator: Secondary | ICD-10-CM | POA: Diagnosis not present

## 2020-09-10 DIAGNOSIS — I5042 Chronic combined systolic (congestive) and diastolic (congestive) heart failure: Secondary | ICD-10-CM | POA: Diagnosis not present

## 2020-09-15 NOTE — Progress Notes (Signed)
EPIC Encounter for ICM Monitoring  Patient Name: Bradley [name] is a 65 y.o. male Date: 09/15/2020 Primary Care Physican: [name], MD Heart Failure Clinic:[facility] Electrophysiologist: Caryl Comes Nephrologist:Deterding 09/15/2020 [id number]  Spoke with patient and reports feeling a little sluggish during decreased impedance but feels fine now.    CorVue thoracic impedancesuggestingpossible fluid accumulation from 2/9-2/13 and returned to baseline 09/13/2020.  Prescribed:Furosemide 20 mgTake 20 mg by mouth 3 (three) times a week. Rolene Course, Sat  Labs: 04/13/2020 Jacelyn Pi, Potassium5.1, [id number], [id number] A complete set of results can be found in Results Review.  Recommendations: No changes and encouraged to call if experiencing any fluid symptoms.  Follow-up plan: ICM clinic phone appointment on 10/18/2020. 91 day device clinic remote transmission5/06/2021.   EP/Cardiology Office Visits: 10/25/2020 with Dr Haroldine Laws. Recall for 9/14/2022with Dr Caryl Comes.   Copy of ICM check sent to [place].  3 month ICM trend: 09/13/2020.    1 Year ICM trend:       [name], RN 09/15/2020 11:12 AM

## 2020-09-15 NOTE — Progress Notes (Signed)
Remote ICD transmission.   

## 2020-09-27 LAB — ECHOCARDIOGRAM COMPLETE
Area-P 1/2: 2.09 cm2
P 1/2 time: 477 msec
S' Lateral: 4.3 cm

## 2020-10-05 ENCOUNTER — Other Ambulatory Visit (HOSPITAL_COMMUNITY): Payer: Self-pay | Admitting: Internal Medicine

## 2020-10-05 DIAGNOSIS — I5042 Chronic combined systolic (congestive) and diastolic (congestive) heart failure: Secondary | ICD-10-CM | POA: Diagnosis not present

## 2020-10-16 DIAGNOSIS — I110 Hypertensive heart disease with heart failure: Secondary | ICD-10-CM | POA: Diagnosis not present

## 2020-10-16 DIAGNOSIS — R9431 Abnormal electrocardiogram [ECG] [EKG]: Secondary | ICD-10-CM | POA: Diagnosis not present

## 2020-10-16 DIAGNOSIS — I2510 Atherosclerotic heart disease of native coronary artery without angina pectoris: Secondary | ICD-10-CM | POA: Diagnosis not present

## 2020-10-16 DIAGNOSIS — R1110 Vomiting, unspecified: Secondary | ICD-10-CM | POA: Diagnosis not present

## 2020-10-16 DIAGNOSIS — J449 Chronic obstructive pulmonary disease, unspecified: Secondary | ICD-10-CM | POA: Diagnosis not present

## 2020-10-16 DIAGNOSIS — I509 Heart failure, unspecified: Secondary | ICD-10-CM | POA: Diagnosis not present

## 2020-10-16 DIAGNOSIS — I959 Hypotension, unspecified: Secondary | ICD-10-CM | POA: Diagnosis not present

## 2020-10-16 DIAGNOSIS — E860 Dehydration: Secondary | ICD-10-CM | POA: Diagnosis not present

## 2020-10-16 DIAGNOSIS — R4020 Unspecified coma: Secondary | ICD-10-CM | POA: Diagnosis not present

## 2020-10-16 DIAGNOSIS — Z951 Presence of aortocoronary bypass graft: Secondary | ICD-10-CM | POA: Diagnosis not present

## 2020-10-16 DIAGNOSIS — R000 Tachycardia, unspecified: Secondary | ICD-10-CM | POA: Diagnosis not present

## 2020-10-16 DIAGNOSIS — R0902 Hypoxemia: Secondary | ICD-10-CM | POA: Diagnosis not present

## 2020-10-16 DIAGNOSIS — F17210 Nicotine dependence, cigarettes, uncomplicated: Secondary | ICD-10-CM | POA: Diagnosis not present

## 2020-10-16 DIAGNOSIS — R55 Syncope and collapse: Secondary | ICD-10-CM | POA: Diagnosis not present

## 2020-10-18 ENCOUNTER — Ambulatory Visit (INDEPENDENT_AMBULATORY_CARE_PROVIDER_SITE_OTHER): Payer: Medicare Other

## 2020-10-18 DIAGNOSIS — Z95810 Presence of automatic (implantable) cardiac defibrillator: Secondary | ICD-10-CM

## 2020-10-18 DIAGNOSIS — I5042 Chronic combined systolic (congestive) and diastolic (congestive) heart failure: Secondary | ICD-10-CM | POA: Diagnosis not present

## 2020-10-18 DIAGNOSIS — I10 Essential (primary) hypertension: Secondary | ICD-10-CM

## 2020-10-18 NOTE — Progress Notes (Signed)
EPIC Encounter for ICM Monitoring  Patient Name: Bradley [name] is a 65 y.o. male Date: 10/18/2020 Primary Care Physican: [name], MD Heart Failure Clinic:[facility] Electrophysiologist: Caryl Comes Nephrologist:Deterding 10/14/2020 Weight: 197 lbs 3/21/2022Weight202lbs   Spoke with patient.  He received IV fluids 3/19 during ER Visit.  Weight gain of 5-6 lbs in the last few days.    CorVue thoracic impedancesuggestingpossible fluid accumulation starting 10/16/2020.  Prescribed:Furosemide 20 mgTake 20 mg by mouth 3 (three) times a week. Rolene Course, Sat  Labs: 10/16/2020 Creatinine 1.45, BUN 22, Potassium 5.2, Sodium 137, GFR 51-59, BNP 4978.0 Care Everywhere A complete set of results can be found in Results Review.  Recommendations: Confirmed Lasix dosage of 3 times a week.  Recommendation to limit salt intake to 2000 mg daily and fluid intake to 64 oz daily.  Encouraged to call if experiencing any fluid symptoms.   Follow-up plan: ICM clinic phone appointment on3/29/2022 since fluid levels will be rechecked. 91 day device clinic remote transmission5/06/2021.   EP/Cardiology Office Visits: 10/25/2020 with Dr Haroldine Laws. Recall for 9/14/2022with Dr Caryl Comes.   Copy of ICM check sent to Dr.Klein and Dr Haroldine Laws for review and recommendations.  3 month ICM trend: 10/18/2020.    1 Year ICM trend:       [name], RN 10/18/2020 10:30 AM

## 2020-10-19 MED ORDER — METOLAZONE 2.5 MG PO TABS: ORAL_TABLET | ORAL | 0 refills | Status: DC

## 2020-10-19 MED ORDER — FUROSEMIDE 20 MG PO TABS: 60.0000 mg | ORAL_TABLET | Freq: Every day | ORAL | 3 refills | Status: DC

## 2020-10-19 NOTE — Progress Notes (Signed)
Received: Anselm Lis, Shaune Pascal, MD  Dereon Corkery Panda, RN Lets have him take lasix 60 every day for now and check him again on Thursday. If no improvement metoalzone 2.5 x 1 thanks   Weekly readings x 1 month

## 2020-10-19 NOTE — Progress Notes (Signed)
Spoke with wife per DPR.  She assist patient with meds.  Advised Dr Haroldine Laws ordered to take 3 tablets of Furosemide 20 mg (60 mg total) daily until further notice.  Requested she send remote transmission on Thursday or Friday morning for review and will check if fluid levels are improving.  If fluid levels do not improve by Friday, 3/25 then he will take a one time dose of Metolazone when directed to do so.   Wife verbalized understanding and repeated instructions back correctly.  Patient has enough Lasix on hand and will call when refill is needed.  Reiterated again not to take Metolazone until directed to do so which will be based off 3/24-3/25 report and patients symptoms.   Advised will have weekly remote transmissions starting 10/27/2020 for the next month as requested by Dr Haroldine Laws.

## 2020-10-21 NOTE — Progress Notes (Deleted)
Subjective:   Bradley [name] is a 65 y.o. male who presents for an Initial Medicare Annual Wellness Visit.  Review of Systems    N/A       Objective:    There were no vitals filed for this visit. There is no height or weight on file to calculate BMI.  Advanced Directives 11/15/2019 07/22/2019 12/21/2016 10/22/2016 01/26/2016 01/30/2015 12/29/2014  Does Patient Have a Medical Advance Directive? No Yes No No No Yes No  Type of Advance Directive - [place];Living will - - - - -  Copy of [place] in Chart? - No - copy requested - - - - -  Would patient like information on creating a medical advance directive? No - Patient declined - - No - Patient declined - - Yes - Educational materials given  Pre-existing out of facility DNR order (yellow form or pink MOST form) - - - - - - -  Some encounter information is confidential and restricted. Go to Review Flowsheets activity to see all data.    Current Medications (verified) Outpatient Encounter Medications as of 10/22/2020  Medication Sig  . acetaminophen (TYLENOL) 325 MG tablet Take 325 mg by mouth 2 (two) times daily as needed for moderate pain or headache.  . ARIPiprazole (ABILIFY) 15 MG tablet TAKE 1 TABLET BY MOUTH EVERY DAY  . aspirin EC 81 MG tablet Take 81 mg by mouth at bedtime.   Marland Kitchen atorvastatin (LIPITOR) 40 MG tablet TAKE 1 TABLET BY MOUTH EVERY DAY  . carvedilol (COREG) 6.25 MG tablet TAKE 1 TABLET (6.25 MG TOTAL) BY MOUTH 2 (TWO) TIMES DAILY WITH A MEAL.  . clonazePAM (KLONOPIN) 1 MG tablet TAKE 1 TABLET BY MOUTH TWICE A DAY *NOT FOR REGULAR USE*  . digoxin (LANOXIN) 0.125 MG tablet TAKE 1 TABLET BY MOUTH EVERY DAY  . docusate sodium (STOOL SOFTENER) 100 MG capsule Take 100 mg by mouth 2 (two) times daily.  . Ensure (ENSURE) Take 237 mLs by mouth daily as needed. (Patient taking differently: Take 237 mLs by mouth every other day. )  . ENTRESTO 97-103 MG TAKE 1 TABLET BY MOUTH TWICE A DAY  .  FARXIGA 10 MG TABS tablet TAKE 1 TABLET BY MOUTH DAILY BEFORE BREAKFAST.  . furosemide (LASIX) 20 MG tablet Take 3 tablets (60 mg total) by mouth daily.  Marland Kitchen levothyroxine (SYNTHROID) 175 MCG tablet TAKE 1 TABLET BY MOUTH DAILY BEFORE BREAKFAST.  Marland Kitchen metolazone (ZAROXOLYN) 2.5 MG tablet Take 1 tablet as directed.  . mexiletine (MEXITIL) 150 MG capsule TAKE 2 CAPSULES BY MOUTH EVERY MORNING AND 2 CAPSULES BY MOUTH EVERY EVENING.  . ranolazine (RANEXA) 500 MG 12 hr tablet TAKE 1 TABLET BY MOUTH TWICE A DAY **NEED OFFICE VISIT FOR FUTURE REFILLS**  . Salicylic Acid (WART REMOVER EX) Apply 1 application topically daily as needed (foot corns).  . sertraline (ZOLOFT) 50 MG tablet TAKE 1 TABLET BY MOUTH EVERY DAY   No facility-administered encounter medications on file as of 10/22/2020.    Allergies (verified) Oxycodone and Rifampin   History: Past Medical History:  Diagnosis Date  . Anxiety   . Aortic stenosis    s/p Bent[place] with bioprosthetic AVR 02/2010; Last echo (9/11): Moderate LVH, EF 45-50%, AVR functioning appropriately, aortic valve mean gradient 21, diastolic dysfunction. Chest MRA (2/13): Mild to moderate dilatation at the sinus of Valsalva at 4.1 cm, mild dilatation ascending aorta distal to the tube graft at 3.9 cm, moderate dilatation of the  innominate artery a 2.1 cm;    . CHF (congestive heart failure) ([place])   . Depression   . ESRD on dialysis [facility])    a. 09/2013 felt to be related to gentamycin b. no longer requiring HD  . Hx of cardiac cath    a. LHC in 02/2010: normal cors  . Hx of echocardiogram 02/2014   Echo (8/15):  Mod LV, EF 35-40%, Gr 1 DD, AVR ok (mean 14 mmHg), mild LAE  . Hypertension   . Hypothyroidism, postsurgical   . Prosthetic valve endocarditis ([place])   . Staphylococcus aureus bacteremia   . Thyroid cancer ([place])   . Ventricular tachycardia ([place])    a. s/p STJ ICD   Past Surgical History:  Procedure Laterality Date  . AORTIC VALVE REPAIR  1968  . AORTIC VALVE  REPLACEMENT  2011  . AV FISTULA PLACEMENT Left 10/08/2013   Procedure: ARTERIOVENOUS (AV) FISTULA CREATION- LEFT ARM; Radial Cephalic ;  Surgeon: Mal [name], MD;  Location: [place];  Service: Vascular;  Laterality: Left;  . BENTALL PROCEDURE N/A 10/28/2013   Procedure: REDO BENTALL PROCEDURE, debridment of aoritc root abscess, replacement of aortic root, ascending aorta and aortic valve with homograft. Insertion of left femoral arterial line;  Surgeon: [name], MD;  Location: [place];  Service: Open Heart Surgery;  Laterality: N/A;  . BIOPSY  07/22/2019   Procedure: BIOPSY;  Surgeon: Doran Stabler, MD;  Location: [facility];  Service: Gastroenterology;;  . CARDIAC CATHETERIZATION  03/02/2010   NORMAL CORONARY ARTERY  . CARDIAC CATHETERIZATION N/A 01/01/2015   Procedure: Coronary/Graft Angiography;  Surgeon: Troy Sine, MD;  Location: [facility];  Service: Cardiovascular;  Laterality: N/A;  . CARDIAC VALVE REPLACEMENT    . COLONOSCOPY WITH PROPOFOL N/A 07/22/2019   Procedure: COLONOSCOPY WITH PROPOFOL;  Surgeon: Doran Stabler, MD;  Location: [facility];  Service: Gastroenterology;  Laterality: N/A;  . EP IMPLANTABLE DEVICE N/A 01/01/2015   Procedure: ICD Implant;  Surgeon: Deboraha Sprang, MD;  Location: [facility];  Service: Cardiovascular;  Laterality: N/A;  . HEMOSTASIS CLIP PLACEMENT  07/22/2019   Procedure: HEMOSTASIS CLIP PLACEMENT;  Surgeon: Doran Stabler, MD;  Location: [facility];  Service: Gastroenterology;;  . INTRAOPERATIVE TRANSESOPHAGEAL ECHOCARDIOGRAM N/A 10/28/2013   Procedure: INTRAOPERATIVE TRANSESOPHAGEAL ECHOCARDIOGRAM;  Surgeon: [name], MD;  Location: [place];  Service: Open Heart Surgery;  Laterality: N/A;  . PERIPHERAL VASCULAR CATHETERIZATION N/A 01/01/2015   Procedure: Aortic Arch Angiography;  Surgeon: Troy Sine, MD;  Location: [place] CV LAB;  Service: Cardiovascular;  Laterality: N/A;  . POLYPECTOMY  07/22/2019    Procedure: POLYPECTOMY;  Surgeon: Doran Stabler, MD;  Location: [facility];  Service: Gastroenterology;;  . SHOULDER ARTHROSCOPY W/ ROTATOR CUFF REPAIR Right 2012  . STERNOTOMY     REDO  . TEE WITHOUT CARDIOVERSION N/A 10/24/2013   Procedure: TRANSESOPHAGEAL ECHOCARDIOGRAM (TEE);  Surgeon: Dorothy Spark, MD;  [facility] END[facility];  Service: Cardiovascular;  Laterality: N/A;  . THYROIDECTOMY  ~ 2005  . TRANSTHORACIC ECHOCARDIOGRAM  03/2010   SHOWED MILD REDUCTION OF LV FUNCTION   Family History  Problem Relation Age of Onset  . Hypertension Mother   . Heart disease Father   . Stroke Father   . Hypertension Father   . Thyroid cancer Sister   . Thyroid cancer Sister   . Kidney Stones Sister   . Heart attack Neg Hx   .  Colon cancer Neg Hx   . Colon polyps Neg Hx   . Esophageal cancer Neg Hx   . Stomach cancer Neg Hx   . Rectal cancer Neg Hx    Social History   Socioeconomic History  . Marital status: Married    Spouse name: Not on file  . Number of children: Not on file  . Years of education: Not on file  . Highest education level: Not on file  Occupational History  . Not on file  Tobacco Use  . Smoking status: Never Smoker  . Smokeless tobacco: Never Used  Vaping Use  . Vaping Use: Never used  Substance and Sexual Activity  . Alcohol use: No    Alcohol/week: 0.0 standard drinks    Comment: Occasional  . Drug use: No  . Sexual activity: Never  Other Topics Concern  . Not on file  Social History Narrative   Lives at home with walker.  Does not drive.  ES[employer] T, [employer], [employer].  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 Difficulty of Paying Living Expenses: Not hard at all  Food Insecurity: Not on file  Transportation Needs: No Transportation Needs  . Lack of Transportation (Medical): No  . Lack of Transportation (Non-Medical): No  Physical Activity: Not on file  Stress: Not on file  Social  Connections: Not on file    Tobacco Counseling Counseling given: Not Answered   Clinical Intake:                 Diabetic?no         Activities of Daily Living No flowsheet data found.  Patient Care Team: [name], MD as PCP - Desma Paganini, MD as PCP - Cardiology (Cardiology) Deboraha Sprang, MD as PCP - Electrophysiology (Cardiology) [facility], Shaune Pascal, MD as PCP - Advanced Heart Failure (Cardiology) [name], MD as Consulting Physician (Cardiothoracic Surgery) Deterding, Jeneen Rinks, MD as Consulting Physician (Nephrology) Deboraha Sprang, MD as Consulting Physician (Cardiology) Viona Gilmore, [facility] as Pharmacist (Pharmacist)  Indicate any recent Medical Services you may have received from other than [facility] providers in the past year (date may be approximate).     Assessment:   This is a routine wellness examination for Bradley [name].  Hearing/Vision screen No exam data present  Dietary issues and exercise activities discussed:    Goals    . Pharmacy Care Plan     CARE PLAN ENTRY  Current Barriers:  . Chronic Disease Management support, education, and care coordination needs related to Hypertension, Heart Failure, Hypothyroidism, Depression, and mood disorder, history of MI   Hypertension . Pharmacist Clinical Goal(s): o Over the next  days, patient will work with PharmD and providers to maintain BP goal <130/80 . Current regimen:  o Carvedilol 6.25mg , 1 tablet twice daily with a meal  . Patient self care activities - Over the next 180 days, patient will: o Check BP every other day, document, and provide at future appointments  History of MI  . Pharmacist Clinical Goal(s): o Over the next 180 days, patient will work with PharmD and providers to maintain LDL goal < 67 . Current regimen:  . Aspirin 81mg , 1 tablet once daily  . Atorvastatin 40mg , 1 tablet every day (takes every other day)  . Interventions: o Discussed taking  medications as indicated and report use at next visit with providers.  . Patient self care  activities - Over the next 180 days, patient will: o Continue current medications.   Heart failure . Pharmacist Clinical Goal(s) o Over the next 180 days, patient will work with PharmD and providers to monitor weight and report weight increase/ shortness of breath.  . Current regimen:   Carvedilol 6.25mg , 1 tablet twice daily with a meal   Digoxin 0.125mg , 1 tablet once daily   Entresto 97-103mg , 1 tablet twice daily   Furosemide 20mg , 1 tablet four times a week   Farxiga 10mg , 1 tablet once daily before breakfast  . Interventions: o We discussed weighing daily; if you gain more than 3 pounds in one day or 5 pounds in one week call your doctor  . Patient self care activities - Over the next 180 days, patient will: o Continue monitoring weight.   Hypothyroidism . Pharmacist Clinical Goal(s) o Over the next 180 days, patient will work with PharmD and providers to maintain TSH: 0.35 to 4.5 uIU/mL  . Current regimen:  o Levothyroxine 144mcg, 1 tablet once daily before breakfast  . Patient self care activities o Patient will continue current medications.   Depression/ mood disorder  . Pharmacist Clinical Goal(s) o Over the next 180 days, patient will work with PharmD and providers to Improve mood/ depression symptoms. . Current regimen:  . Sertraline 50mg , 1 tablet once daily . Aripiprazole (Abilify) 15mg , 1 tablet once daily  . Clonazepam 1mg , 1 tablet twice daily as needed (will take about 3x per week) only when anxiety is high.  . Interventions: o Consulted with Dr. Elease Hashimoto in regards to reported blurry vision. Referral to ophthalmology.  . Patient self care activities o Patient will continue current medications as directed.   Medication management . Pharmacist Clinical Goal(s): o Over the next 180 days, patient will work with PharmD and providers to maintain optimal medication  adherence . Current pharmacy: [facility] . Interventions o Comprehensive medication review performed. o Continue current medication management strategy . Patient self care activities - Over the next 180 days, patient will: o Take medications as prescribed o Report any questions or concerns to PharmD and/or provider(s)  Initial goal documentation       Depression Screen PHQ 2/9 Scores 02/13/2020 05/29/2018 03/19/2014 01/28/2014  PHQ - 2 Score 1 1 0 0  PHQ- 9 Score 12 - - -    Fall Risk Fall Risk  03/19/2014 01/28/2014  Falls in the past year? No No    FALL RISK PREVENTION PERTAINING TO THE HOME:  Any stairs in or around the home? {YES/NO:21197} If so, are there any without handrails? {YES/NO:21197} Home free of loose throw rugs in walkways, pet beds, electrical cords, etc? Yes  Adequate lighting in your home to reduce risk of falls? Yes   ASSISTIVE DEVICES UTILIZED TO PREVENT FALLS:  Life alert? {YES/NO:21197} Use of a cane, walker or w/c? {YES/NO:21197} Grab bars in the bathroom? {YES/NO:21197} Shower chair or bench in shower? {YES/NO:21197} Elevated toilet seat or a handicapped toilet? {YES/NO:21197}  TIMED UP AND GO:  Was the test performed? Yes .  Length of time to ambulate 10 feet: *** sec.   {Appearance of DGLO:7564332}  Cognitive Function:        Immunizations Immunization History  Administered Date(s) Administered  . Influenza Split 08/18/2011  . Influenza, Quadrivalent, Recombinant, Inj, Pf 05/04/2019  . Influenza,inj,Quad PF,6+ Mos 09/11/2013, 03/19/2014, 03/21/2017, 05/29/2018, 05/31/2020  . PFIZER(Purple Top)SARS-COV-2 Vaccination 09/29/2019, 10/20/2019, 07/02/2020  . Pneumococcal Conjugate-13 06/08/2014  . Td 07/31/2004  . Tdap 03/06/2016  TDAP status: Up to date  Flu Vaccine status: Up to date  Pneumococcal vaccine status: Up to date  Covid-19 vaccine status: Completed vaccines  Qualifies for Shingles Vaccine? Yes   Zostavax completed No    Shingrix Completed?: No.    Education has been provided regarding the importance of this vaccine. Patient has been advised to call insurance company to determine out of pocket expense if they have not yet received this vaccine. Advised may also receive vaccine at local pharmacy or Health Dept. Verbalized acceptance and understanding.  Screening Tests Health Maintenance  Topic Date Due  . HIV Screening  Never done  . COLONOSCOPY (Pts 45-22yrs Insurance coverage will need to be confirmed)  07/21/2022  . TETANUS/TDAP  03/06/2026  . INFLUENZA VACCINE  Completed  . COVID-19 Vaccine  Completed  . Hepatitis C Screening  Completed  . HPV VACCINES  Aged Out    Health Maintenance  Health Maintenance Due  Topic Date Due  . HIV Screening  Never done    Colorectal cancer screening: Type of screening: Colonoscopy. Completed 07/20/2019. Repeat every 3 years  Lung Cancer Screening: (Low Dose CT Chest recommended if Age 4-80 years, 30 pack-year currently smoking OR have quit w/in 15years.) does not qualify.   Lung Cancer Screening Referral: n/a   Additional Screening:  Hepatitis C Screening: does not qualify; Completed 03/06/2016  Vision Screening: Recommended annual ophthalmology exams for early detection of glaucoma and other disorders of the eye. Is the patient up to date with their annual eye exam?  {YES/NO:21197} Who is the provider or what is the name of the office in which the patient attends annual eye exams? *** If pt is not established with a provider, would they like to be referred to a provider to establish care? {YES/NO:21197}.   Dental Screening: Recommended annual dental exams for proper oral hygiene  Community Resource Referral / Chronic Care Management: CRR required this visit?  No   CCM required this visit?  No      Plan:     I have personally reviewed and noted the following in the patient's chart:   . Medical and social history . Use of alcohol, tobacco or  illicit drugs  . Current medications and supplements . Functional ability and status . Nutritional status . Physical activity . Advanced directives . List of other physicians . Hospitalizations, surgeries, and ER visits in previous 12 months . Vitals . Screenings to include cognitive, depression, and falls . Referrals and appointments  In addition, I have reviewed and discussed with patient certain preventive protocols, quality metrics, and best practice recommendations. A written personalized care plan for preventive services as well as general preventive health recommendations were provided to patient.     Randel Pigg, LPN   01/27/4764   Nurse Notes: none

## 2020-10-22 ENCOUNTER — Other Ambulatory Visit: Payer: Self-pay

## 2020-10-22 ENCOUNTER — Ambulatory Visit (INDEPENDENT_AMBULATORY_CARE_PROVIDER_SITE_OTHER): Payer: Medicare Other

## 2020-10-22 VITALS — BP 110/78 | HR 68 | Temp 97.7°F | Ht 73.0 in | Wt 195.4 lb

## 2020-10-22 DIAGNOSIS — Z0000 Encounter for general adult medical examination without abnormal findings: Secondary | ICD-10-CM

## 2020-10-22 DIAGNOSIS — I5042 Chronic combined systolic (congestive) and diastolic (congestive) heart failure: Secondary | ICD-10-CM

## 2020-10-22 DIAGNOSIS — Z95810 Presence of automatic (implantable) cardiac defibrillator: Secondary | ICD-10-CM

## 2020-10-22 NOTE — Patient Instructions (Signed)
[name] , Thank you for taking time to come for your Medicare Wellness Visit. I appreciate your ongoing commitment to your health goals. Please review the following plan we discussed and let me know if I can assist you in the future.   Screening recommendations/referrals: Colonoscopy: Up to date, next due 07/21/2022 Recommended yearly ophthalmology/optometry visit for glaucoma screening and checkup Recommended yearly dental visit for hygiene and checkup  Vaccinations: Influenza vaccine: Up to date, next due fall 2022  Pneumococcal vaccine: Up to date, next due at age 2  Tdap vaccine: Up to date, next due 03/06/2026 Shingles vaccine: Currently due for Shingrix,  If you would like to receive we recommend that you do at your local pharmacy as it is less expensive     Advanced directives: Please bring a copy of your advanced medical directives into our office so that we may scan into your chart.   Conditions/risks identified: None   Next appointment: 10/24/2021 @ 2:00 PM with Spade Years, Male Preventive care refers to lifestyle choices and visits with your health care provider that can promote health and wellness. What does preventive care include?  A yearly physical exam. This is also called an annual well check.  Dental exams once or twice a year.  Routine eye exams. Ask your health care provider how often you should have your eyes checked.  Personal lifestyle choices, including:  Daily care of your teeth and gums.  Regular physical activity.  Eating a healthy diet.  Avoiding tobacco and drug use.  Limiting alcohol use.  Practicing safe sex.  Taking low-dose aspirin every day starting at age 11. What happens during an annual well check? The services and screenings done by your health care provider during your annual well check will depend on your age, overall health, lifestyle risk factors, and family history of  disease. Counseling  Your health care provider may ask you questions about your:  Alcohol use.  Tobacco use.  Drug use.  Emotional well-being.  Home and relationship well-being.  Sexual activity.  Eating habits.  Work and work Statistician. Screening  You may have the following tests or measurements:  Height, weight, and BMI.  Blood pressure.  Lipid and cholesterol levels. These may be checked every 5 years, or more frequently if you are over 68 years old.  Skin check.  Lung cancer screening. You may have this screening every year starting at age 26 if you have a 30-pack-year history of smoking and currently smoke or have quit within the past 15 years.  Fecal occult blood test (FOBT) of the stool. You may have this test every year starting at age 44.  Flexible sigmoidoscopy or colonoscopy. You may have a sigmoidoscopy every 5 years or a colonoscopy every 10 years starting at age 58.  Prostate cancer screening. Recommendations will vary depending on your family history and other risks.  Hepatitis C blood test.  Hepatitis B blood test.  Sexually transmitted disease (STD) testing.  Diabetes screening. This is done by checking your blood sugar (glucose) after you have not eaten for a while (fasting). You may have this done every 1-3 years. Discuss your test results, treatment options, and if necessary, the need for more tests with your health care provider. Vaccines  Your health care provider may recommend certain vaccines, such as:  Influenza vaccine. This is recommended every year.  Tetanus, diphtheria, and acellular pertussis (Tdap, Td) vaccine. You may need a Td booster every  10 years.  Zoster vaccine. You may need this after age 51.  Pneumococcal 13-valent conjugate (PCV13) vaccine. You may need this if you have certain conditions and have not been vaccinated.  Pneumococcal polysaccharide (PPSV23) vaccine. You may need one or two doses if you smoke cigarettes  or if you have certain conditions. Talk to your health care provider about which screenings and vaccines you need and how often you need them. This information is not intended to replace advice given to you by your health care provider. Make sure you discuss any questions you have with your health care provider. Document Released: 08/13/2015 Document Revised: 04/05/2016 Document Reviewed: 05/18/2015 Elsevier Interactive Patient Education  2017 [place] Prevention in the Home Falls can cause injuries. They can happen to people of all ages. There are many things you can do to make your home safe and to help prevent falls. What can I do on the outside of my home?  Regularly fix the edges of walkways and driveways and fix any cracks.  Remove anything that might make you trip as you walk through a door, such as a raised step or threshold.  Trim any bushes or trees on the path to your home.  Use bright outdoor lighting.  Clear any walking paths of anything that might make someone trip, such as rocks or tools.  Regularly check to see if handrails are loose or broken. Make sure that both sides of any steps have handrails.  Any raised decks and porches should have guardrails on the edges.  Have any leaves, snow, or ice cleared regularly.  Use sand or salt on walking paths during winter.  Clean up any spills in your garage right away. This includes oil or grease spills. What can I do in the bathroom?  Use night lights.  Install grab bars by the toilet and in the tub and shower. Do not use towel bars as grab bars.  Use non-skid mats or decals in the tub or shower.  If you need to sit down in the shower, use a plastic, non-slip stool.  Keep the floor dry. Clean up any water that spills on the floor as soon as it happens.  Remove soap buildup in the tub or shower regularly.  Attach bath mats securely with double-sided non-slip rug tape.  Do not have throw rugs and other  things on the floor that can make you trip. What can I do in the bedroom?  Use night lights.  Make sure that you have a light by your bed that is easy to reach.  Do not use any sheets or blankets that are too big for your bed. They should not hang down onto the floor.  Have a firm chair that has side arms. You can use this for support while you get dressed.  Do not have throw rugs and other things on the floor that can make you trip. What can I do in the kitchen?  Clean up any spills right away.  Avoid walking on wet floors.  Keep items that you use a lot in easy-to-reach places.  If you need to reach something above you, use a strong step stool that has a grab bar.  Keep electrical cords out of the way.  Do not use floor polish or wax that makes floors slippery. If you must use wax, use non-skid floor wax.  Do not have throw rugs and other things on the floor that can make you trip. What can  I do with my stairs?  Do not leave any items on the stairs.  Make sure that there are handrails on both sides of the stairs and use them. Fix handrails that are broken or loose. Make sure that handrails are as long as the stairways.  Check any carpeting to make sure that it is firmly attached to the stairs. Fix any carpet that is loose or worn.  Avoid having throw rugs at the top or bottom of the stairs. If you do have throw rugs, attach them to the floor with carpet tape.  Make sure that you have a light switch at the top of the stairs and the bottom of the stairs. If you do not have them, ask someone to add them for you. What else can I do to help prevent falls?  Wear shoes that:  Do not have high heels.  Have rubber bottoms.  Are comfortable and fit you well.  Are closed at the toe. Do not wear sandals.  If you use a stepladder:  Make sure that it is fully opened. Do not climb a closed stepladder.  Make sure that both sides of the stepladder are locked into place.  Ask  someone to hold it for you, if possible.  Clearly mark and make sure that you can see:  Any grab bars or handrails.  First and last steps.  Where the edge of each step is.  Use tools that help you move around (mobility aids) if they are needed. These include:  Canes.  Walkers.  Scooters.  Crutches.  Turn on the lights when you go into a dark area. Replace any light bulbs as soon as they burn out.  Set up your furniture so you have a clear path. Avoid moving your furniture around.  If any of your floors are uneven, fix them.  If there are any pets around you, be aware of where they are.  Review your medicines with your doctor. Some medicines can make you feel dizzy. This can increase your chance of falling. Ask your doctor what other things that you can do to help prevent falls. This information is not intended to replace advice given to you by your health care provider. Make sure you discuss any questions you have with your health care provider. Document Released: 05/13/2009 Document Revised: 12/23/2015 Document Reviewed: 08/21/2014 Elsevier Interactive Patient Education  2017 [employer].

## 2020-10-22 NOTE — Progress Notes (Signed)
EPIC Encounter for ICM Monitoring  Patient Name: Bradley [name] is a 65 y.o. male Date: 10/22/2020 Primary Care Physican: [name], MD Heart Failure Clinic:[facility] Electrophysiologist: Caryl Comes Nephrologist:Deterding 10/14/2020 Weight: 197 lbs 3/21/2022Weight202lbs 10/22/2020 Weight: 196 lbs   Spoke with wife and patient's weight has returned to baseline of 196 lbs.  He feels fine this AM.    CorVue thoracic impedancesuggesting fluid levels returned to normal after starting Furosemide 60 mg daily but without taking Metolazone.  Prescribed:Furosemide 20 mgTake 3 tablets (60 mg total) by mouth daily  Labs: 10/16/2020 Creatinine 1.45, BUN 22, Potassium 5.2, Sodium 137, GFR 51-59, BNP 4978.0 Care Everywhere A complete set of results can be found in Results Review.  Recommendations:  Encouraged to call if experiencing any fluid symptoms.   Follow-up plan: ICM clinic phone appointment on3/30/2022 (1st of 4) for weekly fluid levels check x 1 month as requested by Dr Denice Paradise. 91 day device clinic remote transmission5/06/2021.   EP/Cardiology Office Visits: 10/25/2020 with Dr Haroldine Laws. Recall for 9/14/2022with Dr Caryl Comes.   Copy of ICM check sent to [place].  3 month ICM trend: 10/22/2020.    1 Year ICM trend:       [name], RN 10/22/2020 8:48 AM

## 2020-10-22 NOTE — Progress Notes (Signed)
Subjective:   Bradley [name] is a 65 y.o. male who presents for an Initial Medicare Annual Wellness Visit.  Review of Systems    N/A  Cardiac Risk Factors include: advanced age (>49men, >10 women);male gender;hypertension;dyslipidemia     Objective:    Today's Vitals   10/22/20 1359  BP: 110/78  Pulse: 68  Temp: 97.7 F (36.5 C)  TempSrc: Oral  SpO2: 96%  Weight: 195 lb 7 oz (88.6 kg)  Height: 6\' 1"  (1.854 m)   Body mass index is 25.78 kg/m.  Advanced Directives 10/22/2020 11/15/2019 07/22/2019 12/21/2016 10/22/2016 01/26/2016 01/30/2015  Does Patient Have a Medical Advance Directive? Yes No Yes No No No Yes  Type of Paramedic of [place];Living will - [place];Living will - - - -  Copy of [place] in Chart? No - copy requested - No - copy requested - - - -  Would patient like information on creating a medical advance directive? - No - Patient declined - - No - Patient declined - -  Pre-existing out of facility DNR order (yellow form or pink MOST form) - - - - - - -  Some encounter information is confidential and restricted. Go to Review Flowsheets activity to see all data.    Current Medications (verified) Outpatient Encounter Medications as of 10/22/2020  Medication Sig  . acetaminophen (TYLENOL) 325 MG tablet Take 325 mg by mouth 2 (two) times daily as needed for moderate pain or headache.  . ARIPiprazole (ABILIFY) 15 MG tablet TAKE 1 TABLET BY MOUTH EVERY DAY  . aspirin EC 81 MG tablet Take 81 mg by mouth at bedtime.   Marland Kitchen atorvastatin (LIPITOR) 40 MG tablet TAKE 1 TABLET BY MOUTH EVERY DAY  . carvedilol (COREG) 6.25 MG tablet TAKE 1 TABLET (6.25 MG TOTAL) BY MOUTH 2 (TWO) TIMES DAILY WITH A MEAL.  . clonazePAM (KLONOPIN) 1 MG tablet TAKE 1 TABLET BY MOUTH TWICE A DAY *NOT FOR REGULAR USE*  . digoxin (LANOXIN) 0.125 MG tablet TAKE 1 TABLET BY MOUTH EVERY DAY  . docusate sodium (COLACE) 100 MG capsule Take 100  mg by mouth 2 (two) times daily.  . Ensure (ENSURE) Take 237 mLs by mouth daily as needed. (Patient taking differently: Take 237 mLs by mouth every other day.)  . ENTRESTO 97-103 MG TAKE 1 TABLET BY MOUTH TWICE A DAY  . FARXIGA 10 MG TABS tablet TAKE 1 TABLET BY MOUTH DAILY BEFORE BREAKFAST.  . furosemide (LASIX) 20 MG tablet Take 3 tablets (60 mg total) by mouth daily.  Marland Kitchen levothyroxine (SYNTHROID) 175 MCG tablet TAKE 1 TABLET BY MOUTH DAILY BEFORE BREAKFAST.  Marland Kitchen metolazone (ZAROXOLYN) 2.5 MG tablet Take 1 tablet as directed.  . mexiletine (MEXITIL) 150 MG capsule TAKE 2 CAPSULES BY MOUTH EVERY MORNING AND 2 CAPSULES BY MOUTH EVERY EVENING.  . ranolazine (RANEXA) 500 MG 12 hr tablet TAKE 1 TABLET BY MOUTH TWICE A DAY **NEED OFFICE VISIT FOR FUTURE REFILLS**  . Salicylic Acid (WART REMOVER EX) Apply 1 application topically daily as needed (foot corns).  . sertraline (ZOLOFT) 50 MG tablet TAKE 1 TABLET BY MOUTH EVERY DAY   No facility-administered encounter medications on file as of 10/22/2020.    Allergies (verified) Oxycodone and Rifampin   History: Past Medical History:  Diagnosis Date  . Anxiety   . Aortic stenosis    s/p [place] with bioprosthetic AVR 02/2010; Last echo (9/11): Moderate LVH, EF 45-50%, AVR functioning appropriately, aortic  valve mean gradient 21, diastolic dysfunction. Chest MRA (2/13): Mild to moderate dilatation at the sinus of Valsalva at 4.1 cm, mild dilatation ascending aorta distal to the tube graft at 3.9 cm, moderate dilatation of the innominate artery a 2.1 cm;    . CHF (congestive heart failure) ([place])   . Depression   . ESRD on dialysis [facility])    a. 09/2013 felt to be related to gentamycin b. no longer requiring HD  . Hx of cardiac cath    a. LHC in 02/2010: normal cors  . Hx of echocardiogram 02/2014   Echo (8/15):  Mod LV, EF 35-40%, Gr 1 DD, AVR ok (mean 14 mmHg), mild LAE  . Hypertension   . Hypothyroidism, postsurgical   . Prosthetic valve endocarditis  ([place])   . Staphylococcus aureus bacteremia   . Thyroid cancer ([place])   . Ventricular tachycardia ([place])    a. s/p STJ ICD   Past Surgical History:  Procedure Laterality Date  . AORTIC VALVE REPAIR  1968  . AORTIC VALVE REPLACEMENT  2011  . AV FISTULA PLACEMENT Left 10/08/2013   Procedure: ARTERIOVENOUS (AV) FISTULA CREATION- LEFT ARM; Radial Cephalic ;  Surgeon: Mal [name], MD;  Location: [place];  Service: Vascular;  Laterality: Left;  . BENTALL PROCEDURE N/A 10/28/2013   Procedure: REDO BENTALL PROCEDURE, debridment of aoritc root abscess, replacement of aortic root, ascending aorta and aortic valve with homograft. Insertion of left femoral arterial line;  Surgeon: [name], MD;  Location: [place];  Service: Open Heart Surgery;  Laterality: N/A;  . BIOPSY  07/22/2019   Procedure: BIOPSY;  Surgeon: Doran Stabler, MD;  Location: [facility];  Service: Gastroenterology;;  . CARDIAC CATHETERIZATION  03/02/2010   NORMAL CORONARY ARTERY  . CARDIAC CATHETERIZATION N/A 01/01/2015   Procedure: Coronary/Graft Angiography;  Surgeon: [place] Sine, MD;  Location: [place] [facility];  Service: Cardiovascular;  Laterality: N/A;  . CARDIAC VALVE REPLACEMENT    . COLONOSCOPY WITH PROPOFOL N/A 07/22/2019   Procedure: COLONOSCOPY WITH PROPOFOL;  Surgeon: Doran Stabler, MD;  Location: [facility];  Service: Gastroenterology;  Laterality: N/A;  . EP IMPLANTABLE DEVICE N/A 01/01/2015   Procedure: ICD Implant;  Surgeon: Deboraha Sprang, MD;  Location: [place] [facility];  Service: Cardiovascular;  Laterality: N/A;  . HEMOSTASIS CLIP PLACEMENT  07/22/2019   Procedure: HEMOSTASIS CLIP PLACEMENT;  Surgeon: Doran Stabler, MD;  [facility];  Service: Gastroenterology;;  . INTRAOPERATIVE TRANSESOPHAGEAL ECHOCARDIOGRAM N/A 10/28/2013   Procedure: INTRAOPERATIVE TRANSESOPHAGEAL ECHOCARDIOGRAM;  Surgeon: [name], MD;  Location: [place];  Service: Open Heart Surgery;  Laterality: N/A;   . PERIPHERAL VASCULAR CATHETERIZATION N/A 01/01/2015   Procedure: Aortic Arch Angiography;  Surgeon: [place] Sine, MD;  Location: [place] [facility];  Service: Cardiovascular;  Laterality: N/A;  . POLYPECTOMY  07/22/2019   Procedure: POLYPECTOMY;  Surgeon: Doran Stabler, MD;  [facility];  Service: Gastroenterology;;  . SHOULDER ARTHROSCOPY W/ ROTATOR CUFF REPAIR Right 2012  . STERNOTOMY     REDO  . TEE WITHOUT CARDIOVERSION N/A 10/24/2013   Procedure: TRANSESOPHAGEAL ECHOCARDIOGRAM (TEE);  Surgeon: Dorothy Spark, MD;  Location: [facility];  Service: Cardiovascular;  Laterality: N/A;  . THYROIDECTOMY  ~ 2005  . TRANSTHORACIC ECHOCARDIOGRAM  03/2010   SHOWED MILD REDUCTION OF LV FUNCTION   Family History  Problem Relation Age of Onset  . Hypertension Mother   . Heart disease Father   .  Stroke Father   . Hypertension Father   . Thyroid cancer Sister   . Thyroid cancer Sister   . Kidney Stones Sister   . Heart attack Neg Hx   . Colon cancer Neg Hx   . Colon polyps Neg Hx   . Esophageal cancer Neg Hx   . Stomach cancer Neg Hx   . Rectal cancer Neg Hx    Social History   Socioeconomic History  . Marital status: Married    Spouse name: Not on file  . Number of children: Not on file  . Years of education: Not on file  . Highest education level: Not on file  Occupational History  . Not on file  Tobacco Use  . Smoking status: Never Smoker  . Smokeless tobacco: Never Used  Vaping Use  . Vaping Use: Never used  Substance and Sexual Activity  . Alcohol use: No    Alcohol/week: 0.0 standard drinks    Comment: Occasional  . Drug use: No  . Sexual activity: Never  Other Topics Concern  . Not on file  Social History Narrative   Lives at home with walker.  Does not drive.  ESRD T, H, Sa.  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 Difficulty of Paying Living Expenses: Not hard at all  Food  Insecurity: Not on file  Transportation Needs: No Transportation Needs  . Lack of Transportation (Medical): No  . Lack of Transportation (Non-Medical): No  Physical Activity: Not on file  Stress: Not on file  Social Connections: Moderately Isolated  . Frequency of Communication with Friends and Family: Twice a week  . Frequency of Social Gatherings with Friends and Family: More than three times a week  . Attends Religious Services: Never  . Active Member of Clubs or Organizations: No  . Attends Archivist Meetings: Never  . Marital Status: Married    Tobacco Counseling Counseling given: Not Answered   Clinical Intake:  Pre-visit preparation completed: Yes  Pain : No/denies pain     Nutritional Risks: None Diabetes: No  How often do you need to have someone help you when you read instructions, pamphlets, or other written materials from your doctor or pharmacy?: 1 - Never  Diabetic?No  Interpreter Needed?: No  Information entered by :: [place] of Daily Living In your present state of health, do you have any difficulty performing the following activities: 10/22/2020  Hearing? N  Vision? N  Difficulty concentrating or making decisions? Y  Comment has some issues concentrating  Walking or climbing stairs? N  Dressing or bathing? N  Doing errands, shopping? N  Preparing Food and eating ? N  Using the Toilet? N  In the past six months, have you accidently leaked urine? Y  Comment sometimes has dribbles  Do you have problems with loss of bowel control? N  Managing your Medications? N  Managing your Finances? N  Housekeeping or managing your Housekeeping? N  Some recent data might be hidden    Patient Care Team: [name], MD as PCP - General Fay Records, MD as PCP - Cardiology (Cardiology) Deboraha Sprang, MD as PCP - Electrophysiology (Cardiology) Bensimhon, Shaune Pascal, MD as PCP - Advanced Heart Failure (Cardiology) [name], MD as Consulting Physician (Cardiothoracic Surgery) Deterding, Jeneen Rinks, MD as Consulting Physician (Nephrology) Deboraha Sprang, MD as Consulting Physician (Cardiology) Viona [place]more,  [place] as Pharmacist (Pharmacist)  Indicate any recent Medical Services you may have received from other than [facility] providers in the past year (date may be approximate).     Assessment:   This is a routine wellness examination for [place].  Hearing/Vision screen  Hearing Screening   125Hz  250Hz  500Hz  1000Hz  2000Hz  3000Hz  4000Hz  6000Hz  8000Hz   Right ear:           Left ear:           Vision Screening Comments: Gets eye exams once per year. Wears reading glasses   Dietary issues and exercise activities discussed: Current Exercise Habits: The patient does not participate in regular exercise at present, Exercise limited by: cardiac condition(s)  Goals    . DIET - REDUCE SODIUM INTAKE    . Pharmacy Care Plan     CARE PLAN ENTRY  Current Barriers:  . Chronic Disease Management support, education, and care coordination needs related to Hypertension, Heart Failure, Hypothyroidism, Depression, and mood disorder, history of MI   Hypertension . Pharmacist Clinical Goal(s): o Over the next  days, patient will work with PharmD and providers to maintain BP goal <130/80 . Current regimen:  o Carvedilol 6.25mg , 1 tablet twice daily with a meal  . Patient self care activities - Over the next 180 days, patient will: o Check BP every other day, document, and provide at future appointments  History of MI  . Pharmacist Clinical Goal(s): o Over the next 180 days, patient will work with PharmD and providers to maintain LDL goal < 67 . Current regimen:  . Aspirin 81mg , 1 tablet once daily  . Atorvastatin 40mg , 1 tablet every day (takes every other day)  . Interventions: o Discussed taking medications as indicated and report use at next visit with providers.  . Patient self care activities - Over the next  180 days, patient will: o Continue current medications.   Heart failure . Pharmacist Clinical Goal(s) o Over the next 180 days, patient will work with PharmD and providers to monitor weight and report weight increase/ shortness of breath.  . Current regimen:   Carvedilol 6.25mg , 1 tablet twice daily with a meal   Digoxin 0.125mg , 1 tablet once daily   Entresto 97-103mg , 1 tablet twice daily   Furosemide 20mg , 1 tablet four times a week   Farxiga 10mg , 1 tablet once daily before breakfast  . Interventions: o We discussed weighing daily; if you gain more than 3 pounds in one day or 5 pounds in one week call your doctor  . Patient self care activities - Over the next 180 days, patient will: o Continue monitoring weight.   Hypothyroidism . Pharmacist Clinical Goal(s) o Over the next 180 days, patient will work with PharmD and providers to maintain TSH: 0.35 to 4.5 uIU/mL  . Current regimen:  o Levothyroxine 134mcg, 1 tablet once daily before breakfast  . Patient self care activities o Patient will continue current medications.   Depression/ mood disorder  . Pharmacist Clinical Goal(s) o Over the next 180 days, patient will work with PharmD and providers to Improve mood/ depression symptoms. . Current regimen:  . Sertraline 50mg , 1 tablet once daily . Aripiprazole (Abilify) 15mg , 1 tablet once daily  . Clonazepam 1mg , 1 tablet twice daily as needed (will take about 3x per week) only when anxiety is high.  . Interventions: o Consulted with Dr. Elease Hashimoto in regards to reported blurry vision. Referral to ophthalmology.  . Patient self care activities o Patient will  continue current medications as directed.   Medication management . Pharmacist Clinical Goal(s): o Over the next 180 days, patient will work with PharmD and providers to maintain optimal medication adherence . Current pharmacy: [facility] . Interventions o Comprehensive medication review performed. o Continue current  medication management strategy . Patient self care activities - Over the next 180 days, patient will: o Take medications as prescribed o Report any questions or concerns to PharmD and/or provider(s)  Initial goal documentation       Depression Screen PHQ 2/9 Scores 10/22/2020 02/13/2020 05/29/2018 03/19/2014 01/28/2014  PHQ - 2 Score 3 1 1  0 0  PHQ- 9 Score 10 12 - - -    Fall Risk Fall Risk  10/22/2020 03/19/2014 01/28/2014  Falls in the past year? 1 No No  Number falls in past yr: 1 - -  Injury with Fall? 0 - -  Risk for fall due to : Impaired balance/gait - -  Follow up Falls evaluation completed;Falls prevention discussed - -    FALL RISK PREVENTION PERTAINING TO THE HOME:  Any stairs in or around the home? Yes  If so, are there any without handrails? No  Home free of loose throw rugs in walkways, pet beds, electrical cords, etc? Yes  Adequate lighting in your home to reduce risk of falls? Yes   ASSISTIVE DEVICES UTILIZED TO PREVENT FALLS:  Life alert? No  Use of a cane, walker or w/c? No  Grab bars in the bathroom? Yes  Shower chair or bench in shower? No  Elevated toilet seat or a handicapped toilet? No   TIMED UP AND GO:  Was the test performed? Yes .  Length of time to ambulate 10 feet: 3 sec.   Gait steady and fast without use of assistive device  Cognitive Function:   Normal cognitive status assessed by direct observation by this Nurse Health Advisor. No abnormalities found.        Immunizations Immunization History  Administered Date(s) Administered  . Influenza Split 08/18/2011  . Influenza, Quadrivalent, Recombinant, Inj, Pf 05/04/2019  . Influenza,inj,Quad PF,6+ Mos 09/11/2013, 03/19/2014, 03/21/2017, 05/29/2018, 05/31/2020  . PFIZER(Purple Top)SARS-COV-2 Vaccination 09/29/2019, 10/20/2019, 07/02/2020  . Pneumococcal Conjugate-13 06/08/2014  . Td 07/31/2004  . Tdap 03/06/2016    TDAP status: Up to date  Flu Vaccine status: Up to  date  Pneumococcal vaccine status: Up to date  Covid-19 vaccine status: Completed vaccines  Qualifies for Shingles Vaccine? Yes   Zostavax completed No   Shingrix Completed?: No.    Education has been provided regarding the importance of this vaccine. Patient has been advised to call insurance company to determine out of pocket expense if they have not yet received this vaccine. Advised may also receive vaccine at local pharmacy or Health Dept. Verbalized acceptance and understanding.  Screening Tests Health Maintenance  Topic Date Due  . HIV Screening  Never done  . COLONOSCOPY (Pts 45-66yrs Insurance coverage will need to be confirmed)  07/21/2022  . TETANUS/TDAP  03/06/2026  . INFLUENZA VACCINE  Completed  . COVID-19 Vaccine  Completed  . Hepatitis C Screening  Completed  . HPV VACCINES  Aged Out    Health Maintenance  Health Maintenance Due  Topic Date Due  . HIV Screening  Never done    Colorectal cancer screening: Type of screening: Colonoscopy. Completed 07/22/2019. Repeat every 3 years  Lung Cancer Screening: (Low Dose CT Chest recommended if Age 55-80 years, 30 pack-year currently smoking OR have quit w/in 15years.) does  not qualify.   Lung Cancer Screening Referral: N/A   Additional Screening:  Hepatitis C Screening: does qualify; Completed 03/06/2016  Vision Screening: Recommended annual ophthalmology exams for early detection of glaucoma and other disorders of the eye. Is the patient up to date with their annual eye exam?  Yes  Who is the provider or what is the name of the office in which the patient attends annual eye exams? Patient can not remember his eye doctors name  If pt is not established with a provider, would they like to be referred to a provider to establish care? No .   Dental Screening: Recommended annual dental exams for proper oral hygiene  Community Resource Referral / Chronic Care Management: CRR required this visit?  No   CCM required  this visit?  No      Plan:     I have personally reviewed and noted the following in the patient's chart:   . Medical and social history . Use of alcohol, tobacco or illicit drugs  . Current medications and supplements . Functional ability and status . Nutritional status . Physical activity . Advanced directives . List of other physicians . Hospitalizations, surgeries, and ER visits in previous 12 months . Vitals . Screenings to include cognitive, depression, and falls . Referrals and appointments  In addition, I have reviewed and discussed with patient certain preventive protocols, quality metrics, and best practice recommendations. A written personalized care plan for preventive services as well as general preventive health recommendations were provided to patient.     Ofilia Neas, LPN   01/17/5092   Nurse Notes: None

## 2020-10-24 ENCOUNTER — Other Ambulatory Visit: Payer: Self-pay | Admitting: Internal Medicine

## 2020-10-24 NOTE — Progress Notes (Signed)
Heart Failure Clinic Note  Date:  10/24/2020   ID:  Bradley [name], DOB 12/26/1955, MRN [id number]  Location: Home  Provider location: [facility] Type of Visit: Established patient  PCP:  [name], MD  Cardiologist:  Dorris Carnes, MD Primary HF: Bradley [name]  Chief Complaint: Heart Failure follow-up   History of Present Illness:  Bradley [name] is a 65 y.o. y/o male with a complicated past medical history including thyroid cancer (s/p thyroidectomy), hypertension, depression, and congenital aortic stenosis for which he underwent aortic vavulotomy at age 89 with subsequent Bentall procedure in 2001 with a pericardial tissue valve. He had MSSA bacteremia and bacterial endocarditis in 12/4330 complicated by renal failure requiring dialysis for a short period of time. 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   He was admitted 11/2014 with progressive episodes of palpitations and pre-syncope. He was found to have monomorphic ventricular tachycardia. Catheterization demonstrated normal coronaries. Echocardiogram demonstrated EF 30-35% and he underwent STJ dual chamber ICD implant.\  Echo 9/21 EF 35-40%  [facility] on 10/16/20 with syncope and nausea. Had ICD check. No VT. But fluid up. Increased lasix to 60 daily for several days (was on 60mg  MWF) with return to baseline.   He presents for routine f/u today. Says he just hasn't had as much energy before. Feels a little depressed. Had some fluid overload a few weeks ago when he was eating a lot. Now remains on lasix 60 daily (versus MWF). Denies dizziness. No edema, orthopnea or PND. Mild ab bloating.    Failed Cologard. ---2021 Colonoscopy-- 3 polyps. Follow in 3 years. .   Studies: Echo 07/2019 30-35%  Echo 7/19  30-35% AVR stable. Personally  reviewed Echo 5/18 EF 25-30%   CPX 01/01/17  FEV1 3.40 (86%)     FEV1/FVC 83 (107%)     MVV 163 (108%)  Resting HR: 72 Peak HR: 129  (80% age predicted max HR) BP rest: 118/70 BP peak: 174/84  Peak VO2: 21.1 (73% predicted peak VO2) VE/VCO2 slope: 35 OUES: 1.96 Peak RER: 1.22 Ventilatory Threshold: 16.6 (58% predicted or measured peak VO2) VE/MVV: 50% O2pulse: 15  (100% predicted O2pulse)    Past Medical History:  Diagnosis Date  . Anxiety   . Aortic stenosis    s/p Bentall with bioprosthetic AVR 02/2010; Last echo (9/11): Moderate LVH, EF 45-50%, AVR functioning appropriately, aortic valve mean gradient 21, diastolic dysfunction. Chest MRA (2/13): Mild to moderate dilatation at the sinus of Valsalva at 4.1 cm, mild dilatation ascending aorta distal to the tube graft at 3.9 cm, moderate dilatation of the innominate artery a 2.1 cm;    . CHF (congestive heart failure) ([place])   . Depression   . ESRD on dialysis [facility])    a. 09/2013 felt to be related to gentamycin b. no longer requiring HD  . Hx of cardiac cath    a. LHC in 02/2010: normal cors  . Hx of echocardiogram 02/2014   Echo (8/15):  Mod LV, EF 35-40%, Gr 1 DD, AVR ok (mean 14 mmHg), mild LAE  . Hypertension   . Hypothyroidism, postsurgical   . Prosthetic valve endocarditis ([place])   . Staphylococcus aureus bacteremia   . Thyroid cancer ([place])   . Ventricular tachycardia ([place])    a. s/p STJ ICD   Past Surgical History:  Procedure Laterality  Date  . AORTIC VALVE REPAIR  1968  . AORTIC VALVE REPLACEMENT  2011  . AV FISTULA PLACEMENT Left 10/08/2013   Procedure: ARTERIOVENOUS (AV) FISTULA CREATION- LEFT ARM; Radial Cephalic ;  Surgeon: Mal [name], MD;  Location: [place];  Service: Vascular;  Laterality: Left;  . BENTALL PROCEDURE N/A 10/28/2013   Procedure: REDO BENTALL PROCEDURE, debridment of aoritc root abscess, replacement of aortic root, ascending aorta and aortic valve with homograft. Insertion of left  femoral arterial line;  Surgeon: [name], MD;  Location: [place];  Service: Open Heart Surgery;  Laterality: N/A;  . BIOPSY  07/22/2019   Procedure: BIOPSY;  Surgeon: Doran Stabler, MD;  Location: [facility];  Service: Gastroenterology;;  . CARDIAC CATHETERIZATION  03/02/2010   NORMAL CORONARY ARTERY  . CARDIAC CATHETERIZATION N/A 01/01/2015   Procedure: Coronary/Graft Angiography;  Surgeon: Troy Sine, MD;  Location: [place] [facility];  Service: Cardiovascular;  Laterality: N/A;  . CARDIAC VALVE REPLACEMENT    . COLONOSCOPY WITH PROPOFOL N/A 07/22/2019   Procedure: COLONOSCOPY WITH PROPOFOL;  Surgeon: Doran Stabler, MD;  Location: [facility];  Service: Gastroenterology;  Laterality: N/A;  . EP IMPLANTABLE DEVICE N/A 01/01/2015   Procedure: ICD Implant;  Surgeon: Deboraha Sprang, MD;  Location: [place] [facility];  Service: Cardiovascular;  Laterality: N/A;  . HEMOSTASIS CLIP PLACEMENT  07/22/2019   Procedure: HEMOSTASIS CLIP PLACEMENT;  Surgeon: Doran Stabler, MD;  Location: [facility];  Service: Gastroenterology;;  . INTRAOPERATIVE TRANSESOPHAGEAL ECHOCARDIOGRAM N/A 10/28/2013   Procedure: INTRAOPERATIVE TRANSESOPHAGEAL ECHOCARDIOGRAM;  Surgeon: [name], MD;  Location: [place];  Service: Open Heart Surgery;  Laterality: N/A;  . PERIPHERAL VASCULAR CATHETERIZATION N/A 01/01/2015   Procedure: Aortic Arch Angiography;  Surgeon: Troy Sine, MD;  Location: [place] [facility];  Service: Cardiovascular;  Laterality: N/A;  . POLYPECTOMY  07/22/2019   Procedure: POLYPECTOMY;  Surgeon: Doran Stabler, MD;  Location: [facility];  Service: Gastroenterology;;  . SHOULDER ARTHROSCOPY W/ ROTATOR CUFF REPAIR Right 2012  . STERNOTOMY     REDO  . TEE WITHOUT CARDIOVERSION N/A 10/24/2013   Procedure: TRANSESOPHAGEAL ECHOCARDIOGRAM (TEE);  Surgeon: Dorothy Spark, MD;  Location: [facility];  Service: Cardiovascular;  Laterality: N/A;  . THYROIDECTOMY  ~ 2005  .  TRANSTHORACIC ECHOCARDIOGRAM  03/2010   SHOWED MILD REDUCTION OF LV FUNCTION     Current Outpatient Medications  Medication Sig Dispense Refill  . acetaminophen (TYLENOL) 325 MG tablet Take 325 mg by mouth 2 (two) times daily as needed for moderate pain or headache.    . ARIPiprazole (ABILIFY) 15 MG tablet TAKE 1 TABLET BY MOUTH EVERY DAY 90 tablet 1  . aspirin EC 81 MG tablet Take 81 mg by mouth at bedtime.     Marland Kitchen atorvastatin (LIPITOR) 40 MG tablet TAKE 1 TABLET BY MOUTH EVERY DAY 90 tablet 1  . carvedilol (COREG) 6.25 MG tablet TAKE 1 TABLET (6.25 MG TOTAL) BY MOUTH 2 (TWO) TIMES DAILY WITH A MEAL. 90 tablet 1  . clonazePAM (KLONOPIN) 1 MG tablet TAKE 1 TABLET BY MOUTH TWICE A DAY *NOT FOR REGULAR USE* 30 tablet 0  . digoxin (LANOXIN) 0.125 MG tablet TAKE 1 TABLET BY MOUTH EVERY DAY 90 tablet 3  . docusate sodium (COLACE) 100 MG capsule Take 100 mg by mouth 2 (two) times daily.    . Ensure (ENSURE) Take 237 mLs by mouth daily as needed. (Patient taking differently: Take  237 mLs by mouth every other day.) 237 mL 12  . ENTRESTO 97-103 MG TAKE 1 TABLET BY MOUTH TWICE A DAY 180 tablet 3  . FARXIGA 10 MG TABS tablet TAKE 1 TABLET BY MOUTH DAILY BEFORE BREAKFAST. 90 tablet 3  . furosemide (LASIX) 20 MG tablet Take 3 tablets (60 mg total) by mouth daily. 270 tablet 3  . levothyroxine (SYNTHROID) 175 MCG tablet TAKE 1 TABLET BY MOUTH DAILY BEFORE BREAKFAST. 90 tablet 1  . metolazone (ZAROXOLYN) 2.5 MG tablet Take 1 tablet as directed. 1 tablet 0  . mexiletine (MEXITIL) 150 MG capsule TAKE 2 CAPSULES BY MOUTH EVERY MORNING AND 2 CAPSULES BY MOUTH EVERY EVENING. 360 capsule 0  . ranolazine (RANEXA) 500 MG 12 hr tablet TAKE 1 TABLET BY MOUTH TWICE A DAY **NEED OFFICE VISIT FOR FUTURE REFILLS** 180 tablet 3  . Salicylic Acid (WART REMOVER EX) Apply 1 application topically daily as needed (foot corns).    . sertraline (ZOLOFT) 50 MG tablet TAKE 1 TABLET BY MOUTH EVERY DAY 90 tablet 2   No current  facility-administered medications for this encounter.    Allergies:   Oxycodone and Rifampin   Social History:  The patient  reports that he has never smoked. He has never used smokeless tobacco. He reports that he does not drink alcohol and does not use drugs.   Family History:  The patient's family history includes Heart disease in his father; Hypertension in his father and mother; Kidney Stones in his sister; Stroke in his father; Thyroid cancer in his sister and sister.   ROS:  Please see the history of present illness.   All other systems are personally reviewed and negative.   Vitals:   10/25/20 1143  BP: 102/60  Pulse: 62  SpO2: 98%  Weight: 90.4 kg   Wt Readings from Last 3 Encounters:  10/25/20 90.4 kg  10/22/20 88.6 kg  05/31/20 86.2 kg    Exam:  General:  Well appearing. No resp difficulty HEENT: normal Neck: supple. no JVD. Carotids 2+ bilat; no bruits. No lymphadenopathy or thryomegaly appreciated. Cor: PMI nondisplaced. Regular rate & rhythm. No rubs, gallops or murmurs. Lungs: clear but decreased throughout Abdomen: soft, nontender, nondistended. No hepatosplenomegaly. No bruits or masses. Good bowel sounds. Extremities: no cyanosis, clubbing, rash, edema Neuro: alert & orientedx3, cranial nerves grossly intact. moves all 4 extremities w/o difficulty. Affect pleasant  ECG: NSR 60 LBBB 148 ms Personally reviewed   Recent Labs: 04/13/2020: BUN 21; Creatinine, Ser 1.20; Potassium 5.1; Sodium 143 05/31/2020: ALT 18; TSH 0.43  Personally reviewed   Wt Readings from Last 3 Encounters:  10/22/20 88.6 kg (195 lb 7 oz)  05/31/20 86.2 kg (190 lb 1.6 oz)  04/13/20 82.8 kg (182 lb 9.6 oz)      ASSESSMENT AND PLAN:  1. Chronic systolic HF  - NICM St Jude  Echo 5/17 LVEF 25-30%, Mild MR, Mod LAE, RV mildly dilated, Mild RAE. ECHO 12/21/2016 LVEF 25-30%. IW AK RV mild-mod  - Echo 02/20/18 shows 30-35% AVR stable.  - Echo 2020 EF 25% Personally reviewed - Feels  slightly worse today NYHA II-III but objectively looks good. EF improved on last echo  - Volume status improved. Continue lasix 60 mg daily  - March 2017 CPX with moderate HF limitation.  - CPX 6/18 much improved. Peak VO2: 21.1 (73% predicted peak VO2) VE/VCO2 slope: 35 - Echo 9/21 EF 35-40% - Continue Entresto 97/103 mg BID.  - Continue coreg 6.25 mg BID.  Unable to titrate due to low HR - Stop digoxin. Check labs -Continue Farxiga 10 mg daily. - Will see back in a few months with repeat echo. If not feeling any better. Repeat CPX.  2. VT/NICM - Quiescent on mexilitene/Ranexa - ICD interrogated in clinic personally. No recent VT 3. HTN - Blood pressure well controlled. Continue current regimen.. 4. Valvular heart disease - s/p bentall in 2001 and re-do bentall 2015. Stable on recent echo  - Reminded about need for SBE 5. Depression - Stable.    Signed, Glori Bickers, MD  10/24/2020 6:35 PM  Advanced Heart Failure [place] [address] Heart and [place] [address] [phone] (office) [phone] (fax)'

## 2020-10-25 ENCOUNTER — Other Ambulatory Visit: Payer: Self-pay

## 2020-10-25 ENCOUNTER — Encounter (HOSPITAL_COMMUNITY): Payer: Self-pay | Admitting: Internal Medicine

## 2020-10-25 ENCOUNTER — Ambulatory Visit (HOSPITAL_COMMUNITY)
Admission: RE | Admit: 2020-10-25 | Discharge: 2020-10-25 | Disposition: A | Discharge status: Home / Self Care | Payer: Medicare Other | Source: Ambulatory Visit | Attending: Internal Medicine | Admitting: Internal Medicine

## 2020-10-25 VITALS — BP 102/60 | HR 62 | Wt 199.4 lb

## 2020-10-25 DIAGNOSIS — Z79899 Other long term (current) drug therapy: Secondary | ICD-10-CM | POA: Insufficient documentation

## 2020-10-25 DIAGNOSIS — Z992 Dependence on renal dialysis: Secondary | ICD-10-CM | POA: Insufficient documentation

## 2020-10-25 DIAGNOSIS — Z85850 Personal history of malignant neoplasm of thyroid: Secondary | ICD-10-CM | POA: Diagnosis not present

## 2020-10-25 DIAGNOSIS — I428 Other cardiomyopathies: Secondary | ICD-10-CM | POA: Diagnosis not present

## 2020-10-25 DIAGNOSIS — F32A Depression, unspecified: Secondary | ICD-10-CM | POA: Insufficient documentation

## 2020-10-25 DIAGNOSIS — N186 End stage renal disease: Secondary | ICD-10-CM | POA: Diagnosis not present

## 2020-10-25 DIAGNOSIS — I10 Essential (primary) hypertension: Secondary | ICD-10-CM

## 2020-10-25 DIAGNOSIS — Z8249 Family history of ischemic heart disease and other diseases of the circulatory system: Secondary | ICD-10-CM | POA: Insufficient documentation

## 2020-10-25 DIAGNOSIS — E890 Postprocedural hypothyroidism: Secondary | ICD-10-CM | POA: Diagnosis not present

## 2020-10-25 DIAGNOSIS — I447 Left bundle-branch block, unspecified: Secondary | ICD-10-CM | POA: Diagnosis not present

## 2020-10-25 DIAGNOSIS — I350 Nonrheumatic aortic (valve) stenosis: Secondary | ICD-10-CM | POA: Insufficient documentation

## 2020-10-25 DIAGNOSIS — I132 Hypertensive heart and chronic kidney disease with heart failure and with stage 5 chronic kidney disease, or end stage renal disease: Secondary | ICD-10-CM | POA: Diagnosis not present

## 2020-10-25 DIAGNOSIS — I472 Ventricular tachycardia, unspecified: Secondary | ICD-10-CM

## 2020-10-25 DIAGNOSIS — Z7982 Long term (current) use of aspirin: Secondary | ICD-10-CM | POA: Diagnosis not present

## 2020-10-25 DIAGNOSIS — I5022 Chronic systolic (congestive) heart failure: Secondary | ICD-10-CM | POA: Insufficient documentation

## 2020-10-25 DIAGNOSIS — Z953 Presence of xenogenic heart valve: Secondary | ICD-10-CM | POA: Insufficient documentation

## 2020-10-25 DIAGNOSIS — I5042 Chronic combined systolic (congestive) and diastolic (congestive) heart failure: Secondary | ICD-10-CM

## 2020-10-25 LAB — COMPREHENSIVE METABOLIC PANEL
ALT: 53 U/L — ABNORMAL HIGH (ref 0–44)
AST: 34 U/L (ref 15–41)
Albumin: 3.8 g/dL (ref 3.5–5.0)
Alkaline Phosphatase: 57 U/L (ref 38–126)
Anion gap: 6 (ref 5–15)
BUN: 24 mg/dL — ABNORMAL HIGH (ref 8–23)
CO2: 30 mmol/L (ref 22–32)
Calcium: 10 mg/dL (ref 8.9–10.3)
Chloride: 103 mmol/L (ref 98–111)
Creatinine, Ser: 1.27 mg/dL — ABNORMAL HIGH (ref 0.61–1.24)
GFR, Estimated: >60 mL/min (ref >60)
Glucose, Bld: 97 mg/dL (ref 70–99)
Potassium: 5.2 mmol/L — ABNORMAL HIGH (ref 3.5–5.1)
Sodium: 139 mmol/L (ref 135–145)
Total Bilirubin: 0.8 mg/dL (ref 0.3–1.2)
Total Protein: 6.7 g/dL (ref 6.5–8.1)

## 2020-10-25 LAB — DIGOXIN LEVEL: Digoxin Level: 0.4 ng/mL — ABNORMAL LOW (ref 0.8–2.0)

## 2020-10-25 LAB — CBC
HCT: 52.2 % — ABNORMAL HIGH (ref 39.0–52.0)
Hemoglobin: 17.4 g/dL — ABNORMAL HIGH (ref 13.0–17.0)
MCH: 31.7 pg (ref 26.0–34.0)
MCHC: 33.3 g/dL (ref 30.0–36.0)
MCV: 95.1 fL (ref 80.0–100.0)
Platelets: 150 10*3/uL (ref 150–400)
RBC: 5.49 MIL/uL (ref 4.22–5.81)
RDW: 13.6 % (ref 11.5–15.5)
WBC: 7.1 10*3/uL (ref 4.0–10.5)
nRBC: 0 % (ref 0.0–0.2)

## 2020-10-25 LAB — TSH: TSH: 4.66 u[IU]/mL — ABNORMAL HIGH (ref 0.350–4.500)

## 2020-10-25 LAB — T4, FREE: Free T4: 1 ng/dL (ref 0.61–1.12)

## 2020-10-25 MED ORDER — RANOLAZINE ER 500 MG PO TB12: ORAL_TABLET | ORAL | 1 refills | Status: DC

## 2020-10-25 NOTE — Addendum Note (Signed)
Encounter addended by: Jolaine Artist, MD on: 10/25/2020 2:16 PM  Actions taken: Level of Service modified, Visit diagnoses modified

## 2020-10-25 NOTE — Patient Instructions (Signed)
Stop Digoxin  Labs done today, we will call you for abnormal results  Your physician recommends that you schedule a follow-up appointment in: 4 months with echocardiogram  If you have any questions or concerns before your next appointment please send [place] a message through mychart or call our office at [phone].    TO LEAVE A MESSAGE FOR THE NURSE SELECT OPTION 2, PLEASE LEAVE A MESSAGE INCLUDING: . YOUR NAME . DATE OF BIRTH . CALL BACK NUMBER . REASON FOR CALL**this is important as we prioritize the call backs  [place] AS LONG AS YOU CALL BEFORE 4:00 PM  At the [facility], you and your health needs are our priority. As part of our continuing mission to provide you with exceptional heart care, we have created designated Provider Care Teams. These Care Teams include your primary Cardiologist (physician) and Advanced Practice Providers (APPs- Physician Assistants and Nurse Practitioners) who all work together to provide you with the care you need, when you need it.   You may see any of the following providers on your designated Care Team at your next follow up: Marland Kitchen Dr Glori Bickers . Dr Loralie Champagne . Dr Vickki Muff . Darrick Grinder, NP . Lyda Jester, [place] . Audry Riles, PharmD   Please be sure to bring in all your medications bottles to every appointment.

## 2020-10-26 LAB — T3, FREE: T3, Free: 2.6 pg/mL (ref 2.0–4.4)

## 2020-10-27 ENCOUNTER — Ambulatory Visit (INDEPENDENT_AMBULATORY_CARE_PROVIDER_SITE_OTHER): Payer: Medicare Other

## 2020-10-27 ENCOUNTER — Telehealth: Payer: Self-pay

## 2020-10-27 DIAGNOSIS — Z95810 Presence of automatic (implantable) cardiac defibrillator: Secondary | ICD-10-CM

## 2020-10-27 DIAGNOSIS — I5042 Chronic combined systolic (congestive) and diastolic (congestive) heart failure: Secondary | ICD-10-CM

## 2020-10-27 NOTE — Progress Notes (Signed)
EPIC Encounter for ICM Monitoring  Patient Name: Bradley [name] is a 65 y.o. male Date: 10/27/2020 Primary Care Physican: [name], MD Heart Failure Clinic:[facility] Electrophysiologist: Caryl Comes Nephrologist:Deterding 10/22/2020 Weight: 196 lbs   Attempted call to patient and unable to reach.   Transmission reviewed.   CorVue thoracic impedancesuggesting possible dryness.  Prescribed:  Furosemide 20 mgTake 3 tablets (60 mg total) by mouth daily  Farxiga 10 mg take 1 tablet by mouth daily (script started 10/25/2020).  Labs: 10/25/2020 Creatinine 1.27, BUN 24, Potassium 5.2, Sodium 139, GFR >60 10/16/2020 Creatinine 1.45, BUN 22, Potassium 5.2, Sodium 137, GFR 51-59, BNP 4978.0 Care Everywhere A complete set of results can be found in Results Review.  Recommendations: Unable to reach.    Follow-up plan: ICM clinic phone appointment on 11/03/2020 (2nd of 4) for weekly fluid levels check x 1 month as requested by Dr Denice Paradise. 91 day device clinic remote transmission5/06/2021.   EP/Cardiology Office Visits:02/14/2021 with Dr Haroldine Laws.Recall for 9/14/2022with Dr Caryl Comes.   Copy of ICM check sent to Dr.Klein and Dr Haroldine Laws for [facility] of 1st weekly report of 4.   3 month ICM trend: 10/27/2020.    1 Year ICM trend:       [name], RN 10/27/2020 4:02 PM

## 2020-10-27 NOTE — Telephone Encounter (Signed)
Remote ICM transmission received.  Attempted call to patient regarding ICM remote transmission and no answer.  

## 2020-11-03 ENCOUNTER — Ambulatory Visit (INDEPENDENT_AMBULATORY_CARE_PROVIDER_SITE_OTHER): Payer: Medicare Other

## 2020-11-03 DIAGNOSIS — I5042 Chronic combined systolic (congestive) and diastolic (congestive) heart failure: Secondary | ICD-10-CM

## 2020-11-03 DIAGNOSIS — Z95810 Presence of automatic (implantable) cardiac defibrillator: Secondary | ICD-10-CM

## 2020-11-03 NOTE — Progress Notes (Signed)
EPIC Encounter for ICM Monitoring  Patient Name: Bradley [name] is a 65 y.o. male Date: 11/03/2020 Primary Care Physican: [name], MD Heart Failure Clinic:[facility] Electrophysiologist: Caryl Comes Nephrologist:Deterding 10/22/2020 Weight: 196 lbs   Transmission reviewed.   CorVue thoracic impedancesuggestingnormal fluid levels since 10/29/2020.  Prescribed:  Furosemide 20 mgTake3 tablets (60 mg total)by mouthdaily  Farxiga 10 mg take 1 tablet by mouth daily (script started 10/25/2020).  Labs: 10/25/2020 Creatinine 1.27, BUN 24, Potassium 5.2, Sodium 139, GFR >60 10/16/2020 Creatinine 1.45, BUN 22, Potassium 5.2, Sodium 137, GFR 51-59, BNP 4978.0 Care Everywhere A complete set of results can be found in Results Review.  Recommendations: No changes.    Follow-up plan: ICM clinic phone appointment on 11/10/2020(3rd weekly of 4 for 1 month as requested by Dr Denice Paradise). 91 day device clinic remote transmission5/06/2021.   EP/Cardiology Office Visits:02/14/2021 with Dr Haroldine Laws.Recall for 9/14/2022with Dr Caryl Comes.   Copy of ICM check sent to Dr.Klein and Dr Haroldine Laws for [facility] of 3rd weekly report of 4.   3 month ICM trend: 11/03/2020.    1 Year ICM trend:       [name], RN 11/03/2020 8:34 AM

## 2020-11-05 DIAGNOSIS — I5042 Chronic combined systolic (congestive) and diastolic (congestive) heart failure: Secondary | ICD-10-CM | POA: Diagnosis not present

## 2020-11-10 ENCOUNTER — Other Ambulatory Visit (HOSPITAL_COMMUNITY): Payer: Self-pay | Admitting: Internal Medicine

## 2020-11-10 ENCOUNTER — Ambulatory Visit (INDEPENDENT_AMBULATORY_CARE_PROVIDER_SITE_OTHER): Payer: Medicare Other

## 2020-11-10 DIAGNOSIS — Z95810 Presence of automatic (implantable) cardiac defibrillator: Secondary | ICD-10-CM

## 2020-11-10 DIAGNOSIS — I5042 Chronic combined systolic (congestive) and diastolic (congestive) heart failure: Secondary | ICD-10-CM

## 2020-11-12 NOTE — Progress Notes (Signed)
EPIC Encounter for ICM Monitoring  Patient Name: Bradley [name] is a 65 y.o. male Date: 11/12/2020 Primary Care Physican: [name], MD Heart Failure Clinic:[facility] Electrophysiologist: Caryl Comes Nephrologist:Deterding 10/22/2020 Weight: 196 lbs   Transmission reviewed.  CorVue thoracic impedancesuggestingnormal fluid levels since 10/29/2020.  Prescribed:  Furosemide 20 mgTake3 tablets (60 mg total)by mouthdaily  Farxiga 10 mg take 1 tablet by mouth daily (script started 10/25/2020).  Labs: 10/25/2020 Creatinine 1.27, BUN 24, Potassium 5.2, Sodium 139, GFR >60 10/16/2020 Creatinine 1.45, BUN 22, Potassium 5.2, Sodium 137, GFR 51-59, BNP 4978.0 Care Everywhere A complete set of results can be found in Results Review.  Recommendations: No changes.  Follow-up plan: ICM clinic phone appointment on4/25/2022(4thweekly of 4 for 1 month as requested by Dr Denice Paradise). 91 day device clinic remote transmission5/06/2021.   EP/Cardiology Office Visits:02/14/2021 with Dr Haroldine Laws.Recall for 9/14/2022with Dr Caryl Comes.   Copy of ICM check sent to Dr.Kleinand Dr Haroldine Laws as Juluis Rainier.  3 month ICM trend: 11/10/2020.    1 Year ICM trend:       [name], RN 11/12/2020 2:18 PM

## 2020-11-22 ENCOUNTER — Ambulatory Visit (INDEPENDENT_AMBULATORY_CARE_PROVIDER_SITE_OTHER): Payer: Medicare Other

## 2020-11-22 DIAGNOSIS — I5042 Chronic combined systolic (congestive) and diastolic (congestive) heart failure: Secondary | ICD-10-CM | POA: Diagnosis not present

## 2020-11-22 DIAGNOSIS — Z95810 Presence of automatic (implantable) cardiac defibrillator: Secondary | ICD-10-CM

## 2020-11-23 NOTE — Progress Notes (Signed)
EPIC Encounter for ICM Monitoring  Patient Name: Bradley [name] is a 65 y.o. male Date: 11/23/2020 Primary Care Physican: [name], MD Heart Failure Clinic:[facility] Electrophysiologist: Caryl Comes Nephrologist:Deterding 10/22/2020 Weight: 196 lbs   Transmission reviewed.  CorVue thoracic impedancesuggestingpossible fluid accumulation starting 11/19/2020 but returned close to baseline 4/25.  Prescribed:  Furosemide 20 mgTake3 tablets (60 mg total)by mouthdaily  Farxiga 10 mg take 1 tablet by mouth daily (script started 10/25/2020).  Labs: 10/25/2020 Creatinine 1.27, BUN 24, Potassium 5.2, Sodium 139, GFR >60 10/16/2020 Creatinine 1.45, BUN 22, Potassium 5.2, Sodium 137, GFR 51-59, BNP 4978.0 Care Everywhere A complete set of results can be found in Results Review.  Recommendations: No changes.  Follow-up plan: ICM clinic phone appointment on6/12/2020 91 day device clinic remote transmission5/06/2021.   EP/Cardiology Office Visits:02/14/2021 with Dr Haroldine Laws.Recall for 9/14/2022with Dr Caryl Comes.   Copy of ICM check sent to [place].  Direct Trend View through 11/22/2020    3 month ICM trend: 11/22/2020.    1 Year ICM trend:       [name], RN 11/23/2020 1:52 PM

## 2020-11-29 ENCOUNTER — Encounter: Payer: Self-pay | Admitting: Family Medicine

## 2020-11-29 ENCOUNTER — Ambulatory Visit (INDEPENDENT_AMBULATORY_CARE_PROVIDER_SITE_OTHER): Payer: Medicare Other | Admitting: Family Medicine

## 2020-11-29 ENCOUNTER — Other Ambulatory Visit: Payer: Self-pay

## 2020-11-29 VITALS — BP 120/70 | HR 58 | Temp 97.9°F | Wt 205.0 lb

## 2020-11-29 DIAGNOSIS — F339 Major depressive disorder, recurrent, unspecified: Secondary | ICD-10-CM

## 2020-11-29 MED ORDER — ARIPIPRAZOLE 10 MG PO TABS: 10.0000 mg | ORAL_TABLET | Freq: Every day | ORAL | 3 refills | Status: DC

## 2020-11-29 NOTE — Progress Notes (Signed)
Established Patient Office Visit  Subjective:  Patient ID: Bradley [name], male    DOB: [birth year]  Age: 65 y.o. MRN: [id number]  CC:  Chief Complaint  Patient presents with  . Follow-up    HPI Bradley [name] presents for medical follow-up accompanied by his wife.  He has history of systolic heart failure and nonischemic cardiomyopathy.  Followed by heart failure clinic.  Heart failure has apparently been fairly stable.  His other medical problems include history of chronic kidney disease, hypothyroidism, generalized anxiety, past history of major depressive disorder with psychotic features.  Does have hypothyroidism and is on replacement.  Recent TSH 4.6 but normal free T3 and T4.  Generally doing well but wife states he seems to have lots of fatigue and very low motivation.  He seems to be extremely flat and unengaged.  His psychiatric medications include sertraline 50 mg daily, Abilify 15 mg daily, and Klonopin 1 mg twice daily.  He is sleeping fairly well.  No recent delusional type behavior.  Past Medical History:  Diagnosis Date  . Anxiety   . Aortic stenosis    s/p Bent[place] with bioprosthetic AVR 02/2010; Last echo (9/11): Moderate LVH, EF 45-50%, AVR functioning appropriately, aortic valve mean gradient 21, diastolic dysfunction. Chest MRA (2/13): Mild to moderate dilatation at the sinus of Valsalva at 4.1 cm, mild dilatation ascending aorta distal to the tube graft at 3.9 cm, moderate dilatation of the innominate artery a 2.1 cm;    . CHF (congestive heart failure) ([place])   . Depression   . ESRD on dialysis [facility])    a. 09/2013 felt to be related to gentamycin b. no longer requiring HD  . Hx of cardiac cath    a. LHC in 02/2010: normal cors  . Hx of echocardiogram 02/2014   Echo (8/15):  Mod LV, EF 35-40%, Gr 1 DD, AVR ok (mean 14 mmHg), mild LAE  . Hypertension   . Hypothyroidism, postsurgical   . Prosthetic valve endocarditis ([place])   . Staphylococcus aureus bacteremia   .  Thyroid cancer ([place])   . Ventricular tachycardia ([place])    a. s/p STJ ICD    Past Surgical History:  Procedure Laterality Date  . AORTIC VALVE REPAIR  1968  . AORTIC VALVE REPLACEMENT  2011  . AV FISTULA PLACEMENT Left 10/08/2013   Procedure: ARTERIOVENOUS (AV) FISTULA CREATION- LEFT ARM; Radial Cephalic ;  Surgeon: Mal [name], MD;  Location: [facility];  Service: Vascular;  Laterality: Left;  . BENTALL PROCEDURE N/A 10/28/2013   Procedure: REDO BENTALL PROCEDURE, debridment of aoritc root abscess, replacement of aortic root, ascending aorta and aortic valve with homograft. Insertion of left femoral arterial line;  Surgeon: [name], MD;  Location: [place];  Service: Open Heart Surgery;  Laterality: N/A;  . BIOPSY  07/22/2019   Procedure: BIOPSY;  Surgeon: Doran Stabler, MD;  Location: [facility];  Service: Gastroenterology;;  . CARDIAC CATHETERIZATION  03/02/2010   NORMAL CORONARY ARTERY  . CARDIAC CATHETERIZATION N/A 01/01/2015   Procedure: Coronary/Graft Angiography;  Surgeon: Troy Sine, MD;  Location: [place] CV LAB;  Service: Cardiovascular;  Laterality: N/A;  . CARDIAC VALVE REPLACEMENT    . COLONOSCOPY WITH PROPOFOL N/A 07/22/2019   Procedure: COLONOSCOPY WITH PROPOFOL;  Surgeon: Doran Stabler, MD;  [facility]: [facility];  Service: Gastroenterology;  Laterality: N/A;  . EP IMPLANTABLE DEVICE N/A 01/01/2015   Procedure: ICD Implant;  Surgeon: Deboraha Sprang, MD;  Location: [place] CV LAB;  Service: Cardiovascular;  Laterality: N/A;  . HEMOSTASIS CLIP PLACEMENT  07/22/2019   Procedure: HEMOSTASIS CLIP PLACEMENT;  Surgeon: Doran Stabler, MD;  [facility];  Service: Gastroenterology;;  . INTRAOPERATIVE TRANSESOPHAGEAL ECHOCARDIOGRAM N/A 10/28/2013   Procedure: INTRAOPERATIVE TRANSESOPHAGEAL ECHOCARDIOGRAM;  Surgeon: [name], MD;  Location: [place];  Service: Open Heart Surgery;  Laterality: N/A;  . PERIPHERAL VASCULAR CATHETERIZATION N/A  01/01/2015   Procedure: Aortic Arch Angiography;  Surgeon: Troy Sine, MD;  Location: [place] CV LAB;  Service: Cardiovascular;  Laterality: N/A;  . POLYPECTOMY  07/22/2019   Procedure: POLYPECTOMY;  Surgeon: Doran Stabler, MD;  [facility];  Service: Gastroenterology;;  . SHOULDER ARTHROSCOPY W/ ROTATOR CUFF REPAIR Right 2012  . STERNOTOMY     REDO  . TEE WITHOUT CARDIOVERSION N/A 10/24/2013   Procedure: TRANSESOPHAGEAL ECHOCARDIOGRAM (TEE);  Surgeon: Dorothy Spark, MD;  Location: [facility] ENDOSCOPY;  Service: Cardiovascular;  Laterality: N/A;  . THYROIDECTOMY  ~ 2005  . TRANSTHORACIC ECHOCARDIOGRAM  03/2010   SHOWED MILD REDUCTION OF LV FUNCTION    Family History  Problem Relation Age of Onset  . Hypertension Mother   . Heart disease Father   . Stroke Father   . Hypertension Father   . Thyroid cancer Sister   . Thyroid cancer Sister   . Kidney Stones Sister   . Heart attack Neg Hx   . Colon cancer Neg Hx   . Colon polyps Neg Hx   . Esophageal cancer Neg Hx   . Stomach cancer Neg Hx   . Rectal cancer Neg Hx     Social History   Socioeconomic History  . Marital status: Married    Spouse name: Not on file  . Number of children: Not on file  . Years of education: Not on file  . Highest education level: Not on file  Occupational History  . Not on file  Tobacco Use  . Smoking status: Never Smoker  . Smokeless tobacco: Never Used  Vaping Use  . Vaping Use: Never used  Substance and Sexual Activity  . Alcohol use: No    Alcohol/week: 0.0 standard drinks    Comment: Occasional  . Drug use: No  . Sexual activity: Never  Other Topics Concern  . Not on file  Social History Narrative   Lives at home with walker.  Does not drive.  ESRD T, H, Sa.  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 Difficulty of Paying Living Expenses: Not hard at all  Food Insecurity: Not on file  Transportation  Needs: No Transportation Needs  . Lack of Transportation (Medical): No  . Lack of Transportation (Non-Medical): No  Physical Activity: Not on file  Stress: Not on file  Social Connections: Moderately Isolated  . Frequency of Communication with Friends and Family: Twice a week  . Frequency of Social Gatherings with Friends and Family: More than three times a week  . Attends Religious Services: Never  . Active Member of Clubs or Organizations: No  . Attends Archivist Meetings: Never  . Marital Status: Married  Human resources officer Violence: Not on file    Outpatient Medications Prior to Visit  Medication Sig Dispense Refill  . acetaminophen (TYLENOL) 325 MG tablet Take 325 mg by mouth 2 (two) times daily as needed for moderate pain or headache.    Marland Kitchen aspirin  EC 81 MG tablet Take 81 mg by mouth at bedtime.     Marland Kitchen atorvastatin (LIPITOR) 40 MG tablet Take 20 mg by mouth daily.    . carvedilol (COREG) 6.25 MG tablet TAKE 1 TABLET (6.25 MG TOTAL) BY MOUTH 2 (TWO) TIMES DAILY WITH A MEAL. 90 tablet 1  . clonazePAM (KLONOPIN) 1 MG tablet TAKE 1 TABLET BY MOUTH TWICE A DAY *NOT FOR REGULAR USE* 30 tablet 0  . docusate sodium (COLACE) 100 MG capsule Take 100 mg by mouth 2 (two) times daily.    . Ensure (ENSURE) Take 237 mLs by mouth daily as needed. 237 mL 12  . ENTRESTO 97-103 MG TAKE 1 TABLET BY MOUTH TWICE A DAY 180 tablet 3  . FARXIGA 10 MG TABS tablet TAKE 1 TABLET BY MOUTH EVERY DAY BEFORE BREAKFAST 90 tablet 3  . furosemide (LASIX) 20 MG tablet Take 3 tablets (60 mg total) by mouth daily. 270 tablet 3  . levothyroxine (SYNTHROID) 175 MCG tablet TAKE 1 TABLET BY MOUTH DAILY BEFORE BREAKFAST. 90 tablet 1  . metolazone (ZAROXOLYN) 2.5 MG tablet Take 1 tablet as directed. 1 tablet 0  . mexiletine (MEXITIL) 150 MG capsule TAKE 2 CAPSULES BY MOUTH EVERY MORNING AND 2 CAPSULES BY MOUTH EVERY EVENING. 360 capsule 0  . ranolazine (RANEXA) 500 MG 12 hr tablet TAKE 1 TABLET BY MOUTH TWICE A DAY  **NEED OFFICE VISIT FOR FUTURE REFILLS** 180 tablet 1  . Salicylic Acid (WART REMOVER EX) Apply 1 application topically daily as needed (foot corns).    . sertraline (ZOLOFT) 50 MG tablet TAKE 1 TABLET BY MOUTH EVERY DAY 90 tablet 2  . ARIPiprazole (ABILIFY) 15 MG tablet TAKE 1 TABLET BY MOUTH EVERY DAY 90 tablet 1   No facility-administered medications prior to visit.    Allergies  Allergen Reactions  . Oxycodone Other (See Comments)    Gives patient nightmares  . Rifampin Nausea Only    ROS Review of Systems  Constitutional: Positive for fatigue.  Respiratory: Negative for shortness of breath.   Cardiovascular: Negative for chest pain.  Psychiatric/Behavioral: Negative for agitation, hallucinations and suicidal ideas.      Objective:    Physical Exam Vitals reviewed.  Constitutional:      Appearance: Normal appearance.  Cardiovascular:     Rate and Rhythm: Normal rate and regular rhythm.  Pulmonary:     Effort: Pulmonary effort is normal.     Breath sounds: Normal breath sounds.  Neurological:     Mental Status: He is alert.  Psychiatric:     Comments: Somewhat flat affect.     BP 120/70 (BP Location: Left Arm, Patient Position: Sitting, Cuff Size: Normal)   Pulse (!) 58   Temp 97.9 F (36.6 C) (Oral)   Wt 205 lb (93 kg)   SpO2 96%   BMI 27.05 kg/m  Wt Readings from Last 3 Encounters:  11/29/20 205 lb (93 kg)  10/25/20 199 lb 6.4 oz (90.4 kg)  10/22/20 195 lb 7 oz (88.6 kg)     Health Maintenance Due  Topic Date Due  . HIV Screening  Never done    There are no preventive care reminders to display for this patient.  Lab Results  Component Value Date   TSH 4.660 (H) 10/25/2020   Lab Results  Component Value Date   WBC 7.1 10/25/2020   HGB 17.4 (H) 10/25/2020   HCT 52.2 (H) 10/25/2020   MCV 95.1 10/25/2020   PLT 150 10/25/2020  Lab Results  Component Value Date   NA 139 10/25/2020   K 5.2 (H) 10/25/2020   CO2 30 10/25/2020   GLUCOSE 97  10/25/2020   BUN 24 (H) 10/25/2020   CREATININE 1.27 (H) 10/25/2020   BILITOT 0.8 10/25/2020   ALKPHOS 57 10/25/2020   AST 34 10/25/2020   ALT 53 (H) 10/25/2020   PROT 6.7 10/25/2020   ALBUMIN 3.8 10/25/2020   CALCIUM 10.0 10/25/2020   ANIONGAP 6 10/25/2020   GFR 66.36 06/11/2019   Lab Results  Component Value Date   CHOL 136 05/31/2020   Lab Results  Component Value Date   HDL 52 05/31/2020   Lab Results  Component Value Date   LDLCALC 68 05/31/2020   Lab Results  Component Value Date   TRIG 78 05/31/2020   Lab Results  Component Value Date   CHOLHDL 2.6 05/31/2020   Lab Results  Component Value Date   HGBA1C 5.9 (H) 12/30/2014      Assessment & Plan:   History of recurrent depression currently stable.  Wife expresses some concern whether he is overmedicated causing some of his lack of initiative.  We did discuss possible slow tapering back on Abilify to 10 mg daily.  Continue sertraline for now.  Could taper back possibly the Klonopin as well slowly if doing well with the above.  -Recommend some feedback and couple weeks regarding how he is doing with this  Meds ordered this encounter  Medications  . ARIPiprazole (ABILIFY) 10 MG tablet    Sig: Take 1 tablet (10 mg total) by mouth daily.    Dispense:  90 tablet    Refill:  3    Follow-up: Return in about 6 months (around 06/01/2021).    [name], MD

## 2020-12-06 DIAGNOSIS — I5042 Chronic combined systolic (congestive) and diastolic (congestive) heart failure: Secondary | ICD-10-CM | POA: Diagnosis not present

## 2020-12-09 LAB — CUP PACEART REMOTE DEVICE CHECK
Battery Remaining Longevity: 42 mo
Battery Remaining Percentage: 41 %
Battery Voltage: 2.87 V
Brady Statistic RV Percent Paced: 1 %
Date Time Interrogation Session: 20220512020016
HighPow Impedance: 73 Ohm
HighPow Impedance: 73 Ohm
Implantable Lead Implant Date: 20160603
Implantable Lead Location: 753860
Implantable Lead Model: 7122
Implantable Pulse Generator Implant Date: 20160603
Lead Channel Impedance Value: 410 Ohm
Lead Channel Pacing Threshold Amplitude: 1.25 V
Lead Channel Pacing Threshold Pulse Width: 0.7 ms
Lead Channel Sensing Intrinsic Amplitude: 11.7 mV
Lead Channel Setting Pacing Amplitude: 2.5 V
Lead Channel Setting Pacing Pulse Width: 0.5 ms
Lead Channel Setting Sensing Sensitivity: 0.5 mV
Pulse Gen Serial Number: 7135169

## 2020-12-16 ENCOUNTER — Telehealth: Payer: Self-pay | Admitting: Pharmacist

## 2020-12-16 ENCOUNTER — Other Ambulatory Visit: Payer: Self-pay | Admitting: Family Medicine

## 2020-12-16 NOTE — Chronic Care Management (AMB) (Signed)
Chronic Care Management Pharmacy Assistant   Name: Bradley [name]  MRN: [id number] DOB: November 15, 1955  Reason for Encounter: Disease State/ General Assessment Call.    Conditions to be addressed/monitored: CHF and HTN  Recent office visits:  11/29/20 [name] MD (PCP) - seen for depression. Patient started on Aripiprazole 10mg  daily. Follow up in 6 months.   Recent consult visits:  07/26/20 [name] MD (Cardiology) - defibrillator check.  06/21/20 [name] MD (Cardiology) - defibrillator check.   Hospital visits:  Medication Reconciliation was completed by comparing discharge summary, patient's EMR and Pharmacy list, and upon discussion with patient.  Patient visited [facility] Emergency Department on 10/16/20 for vomiting, syncope, dehydration and heart failure. Patient was recommended to be admitted but left on his own will.   New?Medications Started at [facility] Discharge:?? -started None.   Medication Changes at Hospital Discharge: -Changed None.   Medications Discontinued at Hospital Discharge: -Stopped None.   Medications that remain the same after Hospital Discharge:??  -All other medications will remain the same.    Medications: Outpatient Encounter Medications as of 12/16/2020  Medication Sig  . acetaminophen (TYLENOL) 325 MG tablet Take 325 mg by mouth 2 (two) times daily as needed for moderate pain or headache.  . ARIPiprazole (ABILIFY) 10 MG tablet Take 1 tablet (10 mg total) by mouth daily.  Marland Kitchen aspirin EC 81 MG tablet Take 81 mg by mouth at bedtime.   Marland Kitchen atorvastatin (LIPITOR) 40 MG tablet Take 20 mg by mouth daily.  . carvedilol (COREG) 6.25 MG tablet TAKE 1 TABLET (6.25 MG TOTAL) BY MOUTH 2 (TWO) TIMES DAILY WITH A MEAL.  . clonazePAM (KLONOPIN) 1 MG tablet TAKE 1 TABLET BY MOUTH TWICE A DAY *NOT FOR REGULAR USE*  . docusate sodium (COLACE) 100 MG capsule Take 100 mg by mouth 2 (two) times daily.  . Ensure (ENSURE) Take 237 mLs by mouth daily as  needed.  Marland Kitchen ENTRESTO 97-103 MG TAKE 1 TABLET BY MOUTH TWICE A DAY  . FARXIGA 10 MG TABS tablet TAKE 1 TABLET BY MOUTH EVERY DAY BEFORE BREAKFAST  . furosemide (LASIX) 20 MG tablet Take 3 tablets (60 mg total) by mouth daily.  Marland Kitchen levothyroxine (SYNTHROID) 175 MCG tablet TAKE 1 TABLET BY MOUTH DAILY BEFORE BREAKFAST.  Marland Kitchen metolazone (ZAROXOLYN) 2.5 MG tablet Take 1 tablet as directed.  . mexiletine (MEXITIL) 150 MG capsule TAKE 2 CAPSULES BY MOUTH EVERY MORNING AND 2 CAPSULES BY MOUTH EVERY EVENING.  . ranolazine (RANEXA) 500 MG 12 hr tablet TAKE 1 TABLET BY MOUTH TWICE A DAY **NEED OFFICE VISIT FOR FUTURE REFILLS**  . Salicylic Acid (WART REMOVER EX) Apply 1 application topically daily as needed (foot corns).  . sertraline (ZOLOFT) 50 MG tablet TAKE 1 TABLET BY MOUTH EVERY DAY   No facility-administered encounter medications on file as of 12/16/2020.    Reviewed chart prior to disease state call. Spoke with patient regarding BP  Recent Office Vitals: BP Readings from Last 3 Encounters:  11/29/20 120/70  10/25/20 102/60  10/22/20 110/78   Pulse Readings from Last 3 Encounters:  11/29/20 (!) 58  10/25/20 62  10/22/20 68    Wt Readings from Last 3 Encounters:  11/29/20 205 lb (93 kg)  10/25/20 199 lb 6.4 oz (90.4 kg)  10/22/20 195 lb 7 oz (88.6 kg)     Kidney Function Lab Results  Component Value Date/Time   CREATININE 1.27 (H) 10/25/2020 12:37 PM   CREATININE 1.20 04/13/2020 04:09 PM  CREATININE 1.28 10/14/2015 12:28 PM   CREATININE 1.21 07/12/2015 12:07 PM   GFR 66.36 06/11/2019 10:40 AM   GFRNONAA >60 10/25/2020 12:37 PM   GFRAA 74 04/13/2020 04:09 PM    BMP Latest Ref Rng & Units 10/25/2020 04/13/2020 06/11/2019  Glucose 70 - 99 mg/dL 97 70 118(H)  BUN 8 - 23 mg/dL 24(H) 21 24(H)  Creatinine 0.61 - 1.24 mg/dL 1.27(H) 1.20 1.12  BUN/Creat Ratio 10 - 24 - 18 -  Sodium 135 - 145 mmol/L 139 143 142  Potassium 3.5 - 5.1 mmol/L 5.2(H) 5.1 4.7  Chloride 98 - 111 mmol/L 103 105  105  CO2 22 - 32 mmol/L 30 27 32  Calcium 8.9 - 10.3 mg/dL 10.0 9.8 9.5    . Current antihypertensive regimen:  o Carvedilol 6.25mg  - take twice a day.   . How often are you checking your Blood Pressure?   . Current home BP readings:   . What recent interventions/DTPs have been made by any provider to improve Blood Pressure control since last CPP Visit: None.   . Any recent hospitalizations or ED visits since last visit with CPP? Yes  . What diet changes have been made to improve Blood Pressure Control?  o   . What exercise is being done to improve your Blood Pressure Control?  o   Adherence Review: Is the patient currently on ACE/ARB medication? No Does the patient have >5 day gap between last estimated fill dates? No  Multiple unsuccessful attempts to reach patient by phone.   Star Rating Drugs:   farxiga 10mg  - last filled on 11/12/20 90DS at [facility].  [place] Pharmacist Assistant [phone]

## 2020-12-17 NOTE — Telephone Encounter (Cosign Needed)
Left message with wife [name] to have patient call back.

## 2020-12-20 NOTE — Telephone Encounter (Cosign Needed)
2 attempts.

## 2020-12-21 NOTE — Telephone Encounter (Cosign Needed)
3 attempts

## 2020-12-24 ENCOUNTER — Other Ambulatory Visit: Payer: Self-pay | Admitting: Family Medicine

## 2020-12-31 ENCOUNTER — Other Ambulatory Visit (HOSPITAL_COMMUNITY): Payer: Self-pay | Admitting: Internal Medicine

## 2021-01-03 ENCOUNTER — Ambulatory Visit (INDEPENDENT_AMBULATORY_CARE_PROVIDER_SITE_OTHER): Payer: Medicare Other

## 2021-01-03 DIAGNOSIS — Z95810 Presence of automatic (implantable) cardiac defibrillator: Secondary | ICD-10-CM

## 2021-01-03 DIAGNOSIS — I5042 Chronic combined systolic (congestive) and diastolic (congestive) heart failure: Secondary | ICD-10-CM

## 2021-01-04 NOTE — Progress Notes (Signed)
EPIC Encounter for ICM Monitoring  Patient Name: Bradley [name] is a 65 y.o. male Date: 01/04/2021 Primary Care Physican: [name], MD Heart Failure Clinic:[facility] Electrophysiologist: Caryl Comes Nephrologist:Deterding 01/04/2021 Weight: 199 lbs   Spoke with wife and reports patient is feeling well at this time. Heart failure questions reviewed. Pt asymptomatic.  CorVue thoracic impedancesuggestingnormal fluid levels.  Prescribed:  Furosemide 20 mgTake3 tablets (60 mg total)by mouthdaily  Farxiga 10 mg take 1 tablet by mouth daily (script started 10/25/2020).  Labs: 10/25/2020 Creatinine 1.27, BUN 24, Potassium 5.2, Sodium 139, GFR >60 10/16/2020 Creatinine 1.45, BUN 22, Potassium 5.2, Sodium 137, GFR 51-59, BNP 4978.0 Care Everywhere A complete set of results can be found in Results Review.  Recommendations: No changes and encouraged to call if experiencing any fluid symptoms.  Follow-up plan: ICM clinic phone appointment on7/05/2021 91 day device clinic remote transmission8/05/2021.   EP/Cardiology Office Visits:02/14/2021 with Dr Haroldine Laws.Recall for 9/14/2022with Dr Caryl Comes.   Copy of ICM check sent to [facility].  3 month ICM trend: 01/03/2021.    1 Year ICM trend:       [name], RN 01/04/2021 9:12 AM

## 2021-01-06 DIAGNOSIS — I5042 Chronic combined systolic (congestive) and diastolic (congestive) heart failure: Secondary | ICD-10-CM | POA: Diagnosis not present

## 2021-01-12 ENCOUNTER — Other Ambulatory Visit: Payer: Self-pay | Admitting: Cardiology

## 2021-01-16 ENCOUNTER — Other Ambulatory Visit: Payer: Self-pay | Admitting: Family Medicine

## 2021-01-25 ENCOUNTER — Telehealth: Payer: Self-pay | Admitting: Pharmacist

## 2021-01-25 NOTE — Chronic Care Management (AMB) (Signed)
Chronic Care Management Pharmacy Assistant   Name: Bradley [name]  MRN: [id number] DOB: 01-15-1956  Reason for Encounter: Disease State/ General Assessment Call.    Conditions to be addressed/monitored: CHF and HTN  Recent office visits:  None.  Recent consult visits:  None.   Hospital visits:  None in previous 6 months  Medications: Outpatient Encounter Medications as of 01/25/2021  Medication Sig   acetaminophen (TYLENOL) 325 MG tablet Take 325 mg by mouth 2 (two) times daily as needed for moderate pain or headache.   ARIPiprazole (ABILIFY) 10 MG tablet Take 1 tablet (10 mg total) by mouth daily.   aspirin EC 81 MG tablet Take 81 mg by mouth at bedtime.    atorvastatin (LIPITOR) 40 MG tablet Take 20 mg by mouth daily.   carvedilol (COREG) 6.25 MG tablet TAKE 1 TABLET BY MOUTH 2 TIMES DAILY WITH A MEAL.   clonazePAM (KLONOPIN) 1 MG tablet TAKE 1 TABLET BY MOUTH TWICE A DAY *NOT FOR REGULAR USE*   docusate sodium (COLACE) 100 MG capsule Take 100 mg by mouth 2 (two) times daily.   Ensure (ENSURE) Take 237 mLs by mouth daily as needed.   ENTRESTO 97-103 MG TAKE 1 TABLET BY MOUTH TWICE A DAY   FARXIGA 10 MG TABS tablet TAKE 1 TABLET BY MOUTH EVERY DAY BEFORE BREAKFAST   furosemide (LASIX) 20 MG tablet Take 3 tablets (60 mg total) by mouth daily.   levothyroxine (SYNTHROID) 175 MCG tablet TAKE 1 TABLET BY MOUTH EVERY DAY BEFORE BREAKFAST   metolazone (ZAROXOLYN) 2.5 MG tablet Take 1 tablet as directed.   mexiletine (MEXITIL) 150 MG capsule TAKE 2 CAPSULES BY MOUTH EVERY MORNING AND 2 CAPSULES BY MOUTH EVERY EVENING.   ranolazine (RANEXA) 500 MG 12 hr tablet TAKE 1 TABLET BY MOUTH TWICE A DAY **NEED OFFICE VISIT FOR FUTURE REFILLS**   Salicylic Acid (WART REMOVER EX) Apply 1 application topically daily as needed (foot corns).   sertraline (ZOLOFT) 50 MG tablet TAKE 1 TABLET BY MOUTH EVERY DAY   No facility-administered encounter medications on file as of 01/25/2021.     Reviewed chart prior to disease state call. Spoke with patient regarding BP  Recent Office Vitals: BP Readings from Last 3 Encounters:  11/29/20 120/70  10/25/20 102/60  10/22/20 110/78   Pulse Readings from Last 3 Encounters:  11/29/20 (!) 58  10/25/20 62  10/22/20 68    Wt Readings from Last 3 Encounters:  11/29/20 205 lb (93 kg)  10/25/20 199 lb 6.4 oz (90.4 kg)  10/22/20 195 lb 7 oz (88.6 kg)     Kidney Function Lab Results  Component Value Date/Time   CREATININE 1.27 (H) 10/25/2020 12:37 PM   CREATININE 1.20 04/13/2020 04:09 PM   CREATININE 1.28 10/14/2015 12:28 PM   CREATININE 1.21 07/12/2015 12:07 PM   GFR 66.36 06/11/2019 10:40 AM   GFRNONAA >60 10/25/2020 12:37 PM   GFRAA 74 04/13/2020 04:09 PM    BMP Latest Ref Rng & Units 10/25/2020 04/13/2020 06/11/2019  Glucose 70 - 99 mg/dL 97 70 118(H)  BUN 8 - 23 mg/dL 24(H) 21 24(H)  Creatinine 0.61 - 1.24 mg/dL 1.27(H) 1.20 1.12  BUN/Creat Ratio 10 - 24 - 18 -  Sodium 135 - 145 mmol/L 139 143 142  Potassium 3.5 - 5.1 mmol/L 5.2(H) 5.1 4.7  Chloride 98 - 111 mmol/L 103 105 105  CO2 22 - 32 mmol/L 30 27 32  Calcium 8.9 - 10.3 mg/dL 10.0 9.8 9.5  Current antihypertensive regimen:  Carvedilol 6.25mg  - take twice a day.  How often are you checking your Blood Pressure? daily Current home BP readings: 6/26 - 123/79 at 10am, 6/27 - 110/80 at 11am, 6/28 - 135/101, 129/84, 149/89 and today 6/29 -  108/79. What recent interventions/DTPs have been made by any provider to improve Blood Pressure control since last CPP Visit: None.  Any recent hospitalizations or ED visits since last visit with CPP? Yes  Adherence Review: Is the patient currently on ACE/ARB medication? No Does the patient have >5 day gap between last estimated fill dates? No  Notes: Spoke with patients wife Bradley [name] and we reviewed all of patients medications as listed and prescribed. Patient is taking all medications except metolazone. Patient is now  taking furosemide only 3 days a week on mondays, thursdays and Saturday 3 tablets at a time. Patient is also only taking on clonazepam a day now and getting half of an Abilify now. Bradley [name] stated that patient has had no issues with his medications except the Abilify was making him like a zombie which is why she lowered to a half tablet now. Bradley [name] stated for breakfast patient eats around 10 am when he gets up and he has mostly banana bread she makes. He does not eat much during the day. For lunch he does not usually eat but if he dies he a peanut butter and jelly sandwich or something like that. They do eat out for dinner here and there. Last week he did have chinese. The other night he had baked ziti and bread. Last night he had mayflower at his request and he has chicken fingers, sweet potato and cole slaw. Patient does not drink any water . He mostly drinks Gatorade and crystal light around 2 Gatorades a day and 3 glasses of crystal light. I encouraged more water intake and less salt and fried foods especially with heart failure. Bradley [name] and patient were both agreeable. Bradley [name] checks patients blood pressure at least once daily;y and sometimes more if its running a little higher. Bradley [name] thanked me for my call. Patient was overdue for a follow up appointment with Jeni Salles the clinical pharmacist and has only seen annette desantiago. Patient was agreeable to 03/13/21 at 3pm in person. Message sent and task assigned to Jeni Salles to schedule.   Star Rating Drugs: farxiga 10mg  - last filled on 11/12/20 90DS at [place] Pharmacist Assistant [phone]

## 2021-02-04 DIAGNOSIS — I5042 Chronic combined systolic (congestive) and diastolic (congestive) heart failure: Secondary | ICD-10-CM | POA: Diagnosis not present

## 2021-02-06 ENCOUNTER — Other Ambulatory Visit (HOSPITAL_COMMUNITY): Payer: Self-pay | Admitting: Internal Medicine

## 2021-02-07 ENCOUNTER — Other Ambulatory Visit: Payer: Self-pay | Admitting: Internal Medicine

## 2021-02-07 ENCOUNTER — Ambulatory Visit (INDEPENDENT_AMBULATORY_CARE_PROVIDER_SITE_OTHER): Payer: Medicare Other

## 2021-02-07 DIAGNOSIS — I5042 Chronic combined systolic (congestive) and diastolic (congestive) heart failure: Secondary | ICD-10-CM | POA: Diagnosis not present

## 2021-02-07 DIAGNOSIS — Z95810 Presence of automatic (implantable) cardiac defibrillator: Secondary | ICD-10-CM

## 2021-02-09 NOTE — Progress Notes (Signed)
EPIC Encounter for ICM Monitoring  Patient Name: Bradley [name] is a 65 y.o. male Date: 02/09/2021 Primary Care Physican: [name], MD Heart Failure Clinic: [facility] Electrophysiologist: Caryl Comes Nephrologist:  [place] 01/04/2021 Weight: 199 lbs                                                 Spoke with wife and reports patient is feeling well at this time. Heart failure questions reviewed. Pt asymptomatic.   CorVue thoracic impedance suggesting normal fluid levels.   Prescribed:  Furosemide 20 mg Take 3 tablets (60 mg total) by mouth daily Farxiga 10 mg take 1 tablet by mouth daily (script started 10/25/2020).   Labs: 10/25/2020 Creatinine 1.27, BUN 24, Potassium 5.2, Sodium 139, GFR >60 10/16/2020 Creatinine 1.45, BUN 22, Potassium 5.2, Sodium 137, GFR 51-59, BNP 4978.0 Care Everywhere A complete set of results can be found in Results Review.   Recommendations:  No changes and encouraged to call if experiencing any fluid symptoms.   Follow-up plan: ICM clinic phone appointment on 03/14/2021   91 day device clinic remote transmission 03/10/2021.     EP/Cardiology Office Visits:  02/14/2021 with Dr Haroldine Laws.  04/27/2021 with Dr Caryl Comes.     Copy of ICM check sent to Dr. Caryl Comes.  3 month ICM trend: 02/07/2021.    1 Year ICM trend:       [name], RN 02/09/2021 11:57 AM

## 2021-02-14 ENCOUNTER — Ambulatory Visit (HOSPITAL_BASED_OUTPATIENT_CLINIC_OR_DEPARTMENT_OTHER)
Admission: RE | Admit: 2021-02-14 | Discharge: 2021-02-14 | Disposition: A | Discharge status: Home / Self Care | Payer: Medicare Other | Source: Ambulatory Visit | Attending: Internal Medicine | Admitting: Internal Medicine

## 2021-02-14 ENCOUNTER — Ambulatory Visit (HOSPITAL_COMMUNITY)
Admission: RE | Admit: 2021-02-14 | Discharge: 2021-02-14 | Disposition: A | Discharge status: Home / Self Care | Payer: Medicare Other | Source: Ambulatory Visit | Attending: Internal Medicine | Admitting: Internal Medicine

## 2021-02-14 ENCOUNTER — Other Ambulatory Visit: Payer: Self-pay

## 2021-02-14 VITALS — BP 130/84 | HR 64 | Wt 193.2 lb

## 2021-02-14 DIAGNOSIS — E890 Postprocedural hypothyroidism: Secondary | ICD-10-CM | POA: Insufficient documentation

## 2021-02-14 DIAGNOSIS — Z7982 Long term (current) use of aspirin: Secondary | ICD-10-CM | POA: Insufficient documentation

## 2021-02-14 DIAGNOSIS — I428 Other cardiomyopathies: Secondary | ICD-10-CM | POA: Diagnosis not present

## 2021-02-14 DIAGNOSIS — I350 Nonrheumatic aortic (valve) stenosis: Secondary | ICD-10-CM | POA: Diagnosis not present

## 2021-02-14 DIAGNOSIS — I472 Ventricular tachycardia: Secondary | ICD-10-CM | POA: Insufficient documentation

## 2021-02-14 DIAGNOSIS — Z953 Presence of xenogenic heart valve: Secondary | ICD-10-CM | POA: Insufficient documentation

## 2021-02-14 DIAGNOSIS — I359 Nonrheumatic aortic valve disorder, unspecified: Secondary | ICD-10-CM

## 2021-02-14 DIAGNOSIS — Z992 Dependence on renal dialysis: Secondary | ICD-10-CM | POA: Diagnosis not present

## 2021-02-14 DIAGNOSIS — Z7984 Long term (current) use of oral hypoglycemic drugs: Secondary | ICD-10-CM | POA: Insufficient documentation

## 2021-02-14 DIAGNOSIS — F32A Depression, unspecified: Secondary | ICD-10-CM | POA: Insufficient documentation

## 2021-02-14 DIAGNOSIS — Z79899 Other long term (current) drug therapy: Secondary | ICD-10-CM | POA: Diagnosis not present

## 2021-02-14 DIAGNOSIS — I5022 Chronic systolic (congestive) heart failure: Secondary | ICD-10-CM

## 2021-02-14 DIAGNOSIS — N186 End stage renal disease: Secondary | ICD-10-CM | POA: Insufficient documentation

## 2021-02-14 DIAGNOSIS — I5042 Chronic combined systolic (congestive) and diastolic (congestive) heart failure: Secondary | ICD-10-CM

## 2021-02-14 DIAGNOSIS — I10 Essential (primary) hypertension: Secondary | ICD-10-CM | POA: Diagnosis not present

## 2021-02-14 DIAGNOSIS — Z8249 Family history of ischemic heart disease and other diseases of the circulatory system: Secondary | ICD-10-CM | POA: Insufficient documentation

## 2021-02-14 DIAGNOSIS — Z95810 Presence of automatic (implantable) cardiac defibrillator: Secondary | ICD-10-CM | POA: Diagnosis not present

## 2021-02-14 DIAGNOSIS — F419 Anxiety disorder, unspecified: Secondary | ICD-10-CM | POA: Insufficient documentation

## 2021-02-14 DIAGNOSIS — Z79890 Hormone replacement therapy (postmenopausal): Secondary | ICD-10-CM | POA: Diagnosis not present

## 2021-02-14 DIAGNOSIS — Z85850 Personal history of malignant neoplasm of thyroid: Secondary | ICD-10-CM | POA: Insufficient documentation

## 2021-02-14 DIAGNOSIS — I132 Hypertensive heart and chronic kidney disease with heart failure and with stage 5 chronic kidney disease, or end stage renal disease: Secondary | ICD-10-CM | POA: Diagnosis not present

## 2021-02-14 LAB — BRAIN NATRIURETIC PEPTIDE: B Natriuretic Peptide: 233.4 pg/mL — ABNORMAL HIGH (ref 0.0–100.0)

## 2021-02-14 LAB — COMPREHENSIVE METABOLIC PANEL
ALT: 33 U/L (ref 0–44)
AST: 25 U/L (ref 15–41)
Albumin: 4 g/dL (ref 3.5–5.0)
Alkaline Phosphatase: 57 U/L (ref 38–126)
Anion gap: 10 (ref 5–15)
BUN: 20 mg/dL (ref 8–23)
CO2: 27 mmol/L (ref 22–32)
Calcium: 9.8 mg/dL (ref 8.9–10.3)
Chloride: 102 mmol/L (ref 98–111)
Creatinine, Ser: 1.26 mg/dL — ABNORMAL HIGH (ref 0.61–1.24)
GFR, Estimated: >60 mL/min (ref >60)
Glucose, Bld: 96 mg/dL (ref 70–99)
Potassium: 4.9 mmol/L (ref 3.5–5.1)
Sodium: 139 mmol/L (ref 135–145)
Total Bilirubin: 0.7 mg/dL (ref 0.3–1.2)
Total Protein: 7.1 g/dL (ref 6.5–8.1)

## 2021-02-14 LAB — ECHOCARDIOGRAM COMPLETE
AR max vel: 1.27 cm2
AV Area VTI: 1.34 cm2
AV Area mean vel: 1.55 cm2
AV Mean grad: 14 mmHg
AV Peak grad: 28.9 mmHg
Ao pk vel: 2.69 m/s
Area-P 1/2: 1.98 cm2
Calc EF: 39.8 %
S' Lateral: 3.5 cm
Single Plane A2C EF: 35.9 %
Single Plane A4C EF: 46.5 %

## 2021-02-14 LAB — CBC
HCT: 54.2 % — ABNORMAL HIGH (ref 39.0–52.0)
Hemoglobin: 17.9 g/dL — ABNORMAL HIGH (ref 13.0–17.0)
MCH: 31.5 pg (ref 26.0–34.0)
MCHC: 33 g/dL (ref 30.0–36.0)
MCV: 95.4 fL (ref 80.0–100.0)
Platelets: 159 10*3/uL (ref 150–400)
RBC: 5.68 MIL/uL (ref 4.22–5.81)
RDW: 13.4 % (ref 11.5–15.5)
WBC: 7 10*3/uL (ref 4.0–10.5)
nRBC: 0 % (ref 0.0–0.2)

## 2021-02-14 NOTE — Addendum Note (Signed)
Encounter addended by: Stanford Scotland, RN on: 02/14/2021 1:03 PM  Actions taken: Order list changed, Diagnosis association updated, Charge Capture section accepted, Clinical Note Signed

## 2021-02-14 NOTE — Progress Notes (Signed)
Heart Failure Clinic Note  Date:  02/14/2021   ID:  Bradley [name], DOB Aug 30, 1955, MRN [id number]  Location: Home  Provider location: [facility] Type of Visit: Established patient  PCP:  [name], MD  Cardiologist:  Dorris Carnes, MD Primary HF: Shaquan Puerta  Chief Complaint: Heart Failure follow-up   History of Present Illness:  Bradley [name] is a 65 y.o. y/o male with a complicated past medical history including thyroid cancer (s/p thyroidectomy), hypertension, depression, and congenital aortic stenosis for which he underwent aortic vavulotomy at age 89 with subsequent Bentall procedure in 2001 with a pericardial tissue valve.  He had MSSA bacteremia and bacterial endocarditis in 03/3569 complicated by renal failure requiring dialysis for a short period of time.  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     He was admitted 11/2014 with progressive episodes of palpitations and pre-syncope. He was found to have monomorphic ventricular tachycardia. Catheterization demonstrated normal coronaries.  Echocardiogram demonstrated EF 30-35% and he underwent STJ dual chamber ICD implant. \  Echo 9/21 EF 35-40%  [facility] on 10/16/20 with syncope and nausea. Had ICD check. No VT. But fluid up. Increased lasix to 60 daily for several days (was on 60mg  MWF) with return to baseline.    He presents for routine f/u today.  Feeling ok. Not that motivated recently. Denies SOB, CP, orthopnea or PND. Can go to the store without problem. Compliant with all meds. BP has up a bit lately 140/80s  ICD interrogation: No VT/VF. Volume looks good.   Echo today EF 35-40% RV ok Personally reviewed  2021 Colonoscopy-- 3 polyps. Follow in 3 years. .   Studies: Echo 07/2019 30-35%  Echo 7/19  30-35% AVR stable. Personally  reviewed Echo 5/18 EF 25-30%    CPX 01/01/17   FEV1 3.40 (86%)        FEV1/FVC 83 (107%)        MVV 163 (108%)  Resting HR: 72 Peak HR: 129   (80% age predicted max HR) BP rest: 118/70 BP peak: 174/84  Peak VO2: 21.1 (73% predicted peak VO2) VE/VCO2 slope:  35 OUES: 1.96 Peak RER:  1.22 Ventilatory Threshold: 16.6 (58% predicted or measured peak VO2) VE/MVV:  50% O2pulse:  15   (100% predicted O2pulse)     Past Medical History:  Diagnosis Date   Anxiety    Aortic stenosis    s/p Bentall with bioprosthetic AVR 02/2010; Last echo (9/11): Moderate LVH, EF 45-50%, AVR functioning appropriately, aortic valve mean gradient 21, diastolic dysfunction. Chest MRA (2/13): Mild to moderate dilatation at the sinus of Valsalva at 4.1 cm, mild dilatation ascending aorta distal to the tube graft at 3.9 cm, moderate dilatation of the innominate artery a 2.1 cm;     CHF (congestive heart failure) ([place])    Depression    ESRD on dialysis ([place])    a. 09/2013 felt to be related to gentamycin b. no longer requiring HD   Hx of cardiac cath    a. LHC in 02/2010: normal cors   Hx of echocardiogram 02/2014   Echo (8/15):  Mod LV, EF 35-40%, Gr 1 DD, AVR ok (mean 14 mmHg), mild LAE   Hypertension    Hypothyroidism, postsurgical    Prosthetic valve endocarditis ([facility])    Staphylococcus aureus bacteremia    Thyroid cancer (  [place])    Ventricular tachycardia ([place])    a. s/p STJ ICD   Past Surgical History:  Procedure Laterality Date   AORTIC VALVE REPAIR  1968   AORTIC VALVE REPLACEMENT  2011   AV FISTULA PLACEMENT Left 10/08/2013   Procedure: ARTERIOVENOUS (AV) FISTULA CREATION- LEFT ARM; Radial Cephalic ;  Surgeon: Mal [name], MD;  Location: [facility];  Service: Vascular;  Laterality: Left;   BENTALL PROCEDURE N/A 10/28/2013   Procedure: REDO BENTALL PROCEDURE, debridment of aoritc root abscess, replacement of aortic root, ascending aorta and aortic valve with homograft. Insertion of left femoral arterial  line;  Surgeon: [name], MD;  Location: [place];  Service: Open Heart Surgery;  Laterality: N/A;   BIOPSY  07/22/2019   Procedure: BIOPSY;  Surgeon: Doran Stabler, MD;  Location: [facility];  Service: Gastroenterology;;   CARDIAC CATHETERIZATION  03/02/2010   NORMAL CORONARY ARTERY   CARDIAC CATHETERIZATION N/A 01/01/2015   Procedure: Coronary/Graft Angiography;  Surgeon: Troy Sine, MD;  Location: [facility];  Service: Cardiovascular;  Laterality: N/A;   CARDIAC VALVE REPLACEMENT     COLONOSCOPY WITH PROPOFOL N/A 07/22/2019   Procedure: COLONOSCOPY WITH PROPOFOL;  Surgeon: Doran Stabler, MD;  Location: [facility];  Service: Gastroenterology;  Laterality: N/A;   EP IMPLANTABLE DEVICE N/A 01/01/2015   Procedure: ICD Implant;  Surgeon: Deboraha Sprang, MD;  Location: [facility];  Service: Cardiovascular;  Laterality: N/A;   HEMOSTASIS CLIP PLACEMENT  07/22/2019   Procedure: HEMOSTASIS CLIP PLACEMENT;  Surgeon: Doran Stabler, MD;  Location: [facility];  Service: Gastroenterology;;   INTRAOPERATIVE TRANSESOPHAGEAL ECHOCARDIOGRAM N/A 10/28/2013   Procedure: INTRAOPERATIVE TRANSESOPHAGEAL ECHOCARDIOGRAM;  Surgeon: [name], MD;  Location: [place];  Service: Open Heart Surgery;  Laterality: N/A;   PERIPHERAL VASCULAR CATHETERIZATION N/A 01/01/2015   Procedure: Aortic Arch Angiography;  Surgeon: Troy Sine, MD;  Location: [facility];  Service: Cardiovascular;  Laterality: N/A;   POLYPECTOMY  07/22/2019   Procedure: POLYPECTOMY;  Surgeon: Doran Stabler, MD;  Location: [facility];  Service: Gastroenterology;;   SHOULDER ARTHROSCOPY W/ ROTATOR CUFF REPAIR Right 2012   STERNOTOMY     REDO   TEE WITHOUT CARDIOVERSION N/A 10/24/2013   Procedure: TRANSESOPHAGEAL ECHOCARDIOGRAM (TEE);  Surgeon: Dorothy Spark, MD;  Location: [place];  Service: Cardiovascular;  Laterality: N/A;   THYROIDECTOMY  ~ 2005   TRANSTHORACIC ECHOCARDIOGRAM   03/2010   SHOWED MILD REDUCTION OF LV FUNCTION     Current Outpatient Medications  Medication Sig Dispense Refill   acetaminophen (TYLENOL) 325 MG tablet Take 325 mg by mouth 2 (two) times daily as needed for moderate pain or headache.     ARIPiprazole (ABILIFY) 10 MG tablet Take 1 tablet (10 mg total) by mouth daily. 90 tablet 3   aspirin EC 81 MG tablet Take 81 mg by mouth at bedtime.      atorvastatin (LIPITOR) 40 MG tablet Take 20 mg by mouth daily.     carvedilol (COREG) 6.25 MG tablet TAKE 1 TABLET BY MOUTH 2 TIMES DAILY WITH A MEAL. 180 tablet 3   clonazePAM (KLONOPIN) 1 MG tablet TAKE 1 TABLET BY MOUTH TWICE A DAY *NOT FOR REGULAR USE* 30 tablet 0   docusate sodium (COLACE) 100 MG capsule Take 100 mg by mouth 2 (two) times daily.     Ensure (ENSURE) Take 237 mLs by mouth daily as needed. 237 mL 12  ENTRESTO 97-103 MG TAKE 1 TABLET BY MOUTH TWICE A DAY 180 tablet 3   FARXIGA 10 MG TABS tablet TAKE 1 TABLET BY MOUTH EVERY DAY BEFORE BREAKFAST 90 tablet 3   furosemide (LASIX) 20 MG tablet Take 60 mg by mouth 3 (three) times a week. Monday and Thursday and Saturday     levothyroxine (SYNTHROID) 175 MCG tablet TAKE 1 TABLET BY MOUTH EVERY DAY BEFORE BREAKFAST 90 tablet 1   metolazone (ZAROXOLYN) 2.5 MG tablet Take 1 tablet as directed. 1 tablet 0   mexiletine (MEXITIL) 150 MG capsule TAKE 2 CAPSULES BY MOUTH EVERY MORNING AND 2 CAPSULES BY MOUTH EVERY EVENING. 360 capsule 3   ranolazine (RANEXA) 500 MG 12 hr tablet TAKE 1 TABLET BY MOUTH TWICE A DAY **NEED OFFICE VISIT FOR FUTURE REFILLS** 950 tablet 1   Salicylic Acid (WART REMOVER EX) Apply 1 application topically daily as needed (foot corns).     sertraline (ZOLOFT) 50 MG tablet TAKE 1 TABLET BY MOUTH EVERY DAY 90 tablet 2   No current facility-administered medications for this encounter.    Allergies:   Oxycodone and Rifampin   Social History:  The patient  reports that he has never smoked. He has never used smokeless tobacco. He  reports that he does not drink alcohol and does not use drugs.   Family History:  The patient's family history includes Heart disease in his father; Hypertension in his father and mother; Kidney Stones in his sister; Stroke in his father; Thyroid cancer in his sister and sister.   ROS:  Please see the history of present illness.   All other systems are personally reviewed and negative.   Vitals:   02/14/21 1157  BP: 130/84  Pulse: 64  SpO2: 98%  Weight: 87.6 kg (193 lb 3.2 oz)   Wt Readings from Last 3 Encounters:  02/14/21 87.6 kg (193 lb 3.2 oz)  11/29/20 93 kg (205 lb)  10/25/20 90.4 kg (199 lb 6.4 oz)    Exam:  General:  Well appearing. No resp difficulty HEENT: normal Neck: supple. no JVD. Carotids 2+ bilat; no bruits. No lymphadenopathy or thryomegaly appreciated. Cor: PMI nondisplaced. Regular rate & rhythm. No rubs, gallops or murmurs. Lungs: clear Abdomen: soft, nontender, nondistended. No hepatosplenomegaly. No bruits or masses. Good bowel sounds. Extremities: no cyanosis, clubbing, rash, edema Neuro: alert & orientedx3, cranial nerves grossly intact. moves all 4 extremities w/o difficulty. Affect pleasant   ECG: NSR 64 LBBB 156 ms Personally reviewed   Recent Labs: 10/25/2020: ALT 53; BUN 24; Creatinine, Ser 1.27; Hemoglobin 17.4; Platelets 150; Potassium 5.2; Sodium 139; TSH 4.660  Personally reviewed   Wt Readings from Last 3 Encounters:  02/14/21 87.6 kg (193 lb 3.2 oz)  11/29/20 93 kg (205 lb)  10/25/20 90.4 kg (199 lb 6.4 oz)      ASSESSMENT AND PLAN:  1. Chronic systolic HF  - NICM St Jude  Echo 5/17 LVEF 25-30%, Mild MR, Mod LAE, RV mildly dilated, Mild RAE. ECHO 12/21/2016 LVEF 25-30%. IW AK RV mild-mod  - Echo 02/20/18 shows 30-35% AVR stable.  - Echo 2020 EF 25% Personally reviewed - Feels slightly worse today NYHA II-III but objectively looks good. EF improved on last echo  - Volume status improved. Continue lasix 60 mg daily  - March 2017 CPX  with moderate HF limitation.  - CPX 6/18 much improved. Peak VO2: 21.1 (73% predicted peak VO2) VE/VCO2 slope: 35 - Echo 9/21 EF 35-40% - Echo today EF 35-40%  RV ok Personally reviewed - Continue Entresto 97/103 mg BID.  - Continue coreg 6.25 mg BID. Unable to titrate due to low HR - Continue Farxiga 10 mg daily. - Not on spiro due to K persistently > 5 - Labs today 2. VT/NICM - Quiescent on mexilitene/Ranexa - ICD interrogated in clinic personally. No recent VT 3. HTN - BP ok. If SBP persistently > 150 call me 4. Valvular heart disease - s/p bentall in 2001 and re-do bentall 2015.  - AVR stable on echo today  - Reminded about need for SBE 5. Depression - Stable. Encouraged him to participate in more activities.    Signed, Glori Bickers, MD  02/14/2021 12:45 PM  [facility] [place] [address] Heart and [place] [address] [phone] (office) [phone] (fax)'

## 2021-02-14 NOTE — Patient Instructions (Signed)
Lab work drawn today,we will call you with any abnormal results  Call our office if systolic b/p grater than 161 persistently  Your physician recommends that you schedule a follow-up appointment in: 6 months. Call our office in December  to schedule an appointment  If you have any questions or concerns before your next appointment please send [place] a message through [place] or call our office at [phone].    TO LEAVE A MESSAGE FOR THE NURSE SELECT OPTION 2, PLEASE LEAVE A MESSAGE INCLUDING: YOUR NAME DATE OF BIRTH CALL BACK NUMBER REASON FOR CALL**this is important as we prioritize the call backs  YOU WILL RECEIVE A CALL BACK THE SAME DAY AS LONG AS YOU CALL BEFORE 4:00 PM  milAt the [facility], you and your health needs are our priority. As part of our continuing mission to provide you with exceptional heart care, we have created designated Provider Care Teams. These Care Teams include your primary Cardiologist (physician) and Advanced Practice Providers (APPs- Physician Assistants and Nurse Practitioners) who all work together to provide you with the care you need, when you need it.   You may see any of the following providers on your designated Care Team at your next follow up: Dr Glori Bickers Dr Loralie Champagne Dr Patrice Paradise, NP Lyda Jester, [place] Ginnie Smart Audry Riles, PharmD   Please be sure to bring in all your medications bottles to every appointment.

## 2021-02-15 ENCOUNTER — Other Ambulatory Visit (HOSPITAL_COMMUNITY): Payer: Medicare Other

## 2021-02-15 ENCOUNTER — Encounter (HOSPITAL_COMMUNITY): Payer: Medicare Other | Admitting: Internal Medicine

## 2021-03-07 DIAGNOSIS — I5042 Chronic combined systolic (congestive) and diastolic (congestive) heart failure: Secondary | ICD-10-CM | POA: Diagnosis not present

## 2021-03-10 ENCOUNTER — Ambulatory Visit (INDEPENDENT_AMBULATORY_CARE_PROVIDER_SITE_OTHER): Payer: Medicare Other

## 2021-03-10 DIAGNOSIS — I428 Other cardiomyopathies: Secondary | ICD-10-CM | POA: Diagnosis not present

## 2021-03-10 DIAGNOSIS — H2513 Age-related nuclear cataract, bilateral: Secondary | ICD-10-CM | POA: Diagnosis not present

## 2021-03-10 DIAGNOSIS — H524 Presbyopia: Secondary | ICD-10-CM | POA: Diagnosis not present

## 2021-03-10 LAB — CUP PACEART REMOTE DEVICE CHECK
Battery Remaining Longevity: 38 mo
Battery Remaining Percentage: 38 %
Battery Voltage: 2.86 V
Brady Statistic RV Percent Paced: 1 %
Date Time Interrogation Session: 20220811041040
HighPow Impedance: 71 Ohm
HighPow Impedance: 71 Ohm
Implantable Lead Implant Date: 20160603
Implantable Lead Location: 753860
Implantable Lead Model: 7122
Implantable Pulse Generator Implant Date: 20160603
Lead Channel Impedance Value: 400 Ohm
Lead Channel Pacing Threshold Amplitude: 1.25 V
Lead Channel Pacing Threshold Pulse Width: 0.7 ms
Lead Channel Sensing Intrinsic Amplitude: 11.7 mV
Lead Channel Setting Pacing Amplitude: 2.5 V
Lead Channel Setting Pacing Pulse Width: 0.5 ms
Lead Channel Setting Sensing Sensitivity: 0.5 mV
Pulse Gen Serial Number: 7135169

## 2021-03-14 ENCOUNTER — Telehealth: Payer: Self-pay

## 2021-03-14 ENCOUNTER — Telehealth: Payer: Self-pay | Admitting: Pharmacist

## 2021-03-14 ENCOUNTER — Ambulatory Visit (INDEPENDENT_AMBULATORY_CARE_PROVIDER_SITE_OTHER): Payer: Medicare Other

## 2021-03-14 DIAGNOSIS — I5022 Chronic systolic (congestive) heart failure: Secondary | ICD-10-CM | POA: Diagnosis not present

## 2021-03-14 DIAGNOSIS — Z95810 Presence of automatic (implantable) cardiac defibrillator: Secondary | ICD-10-CM

## 2021-03-14 NOTE — Telephone Encounter (Signed)
Remote ICM transmission received.  Attempted call to wife regarding ICM remote transmission and left detailed message per DPR.  Advised to return call for any fluid symptoms or questions. Next ICM remote transmission scheduled 03/22/2021.

## 2021-03-14 NOTE — Chronic Care Management (AMB) (Signed)
    Chronic Care Management Pharmacy Assistant   Name: Bradley [name]  MRN: [id number] DOB: [birth year]  03/14/21-  Called patient to remind of appointment with Jeni Salles) on (03/15/21 at 3pm in office.)  No answer, left message of appointment date, time and type of appointment (either telephone or in person). Left message to have all medications, supplements, blood pressure and/or blood sugar logs available during appointment and to return call if need to reschedule.  Care Gaps:  AWV -  scheduled for 10/24/21 HIV screening - never done Zoster vaccines - never done Pneumonia vaccine - overdue since 06/09/15 Covid - 19 vaccine booster 4 - overdue since 09/30/20 Flu vaccine - due  Notes:  Spoke with Maudie Mercury at [facility] who verified the last fill date for patients atorvastatin as below.  [facility] no record of patient.  Star Rating Drug:  Atorvastatin 40mg  - last filled on 10/28/20 [facility] at [facility]  [place] 10mg  - last filled on 02/03/21 [facility] at [facility] Entresto 97-103mg  - last filled on 02/07/21 [facility] at [facility]  Any gaps in medications fill history? Yes.  [place]  Clinical Pharmacist Assistant [phone]

## 2021-03-14 NOTE — Progress Notes (Signed)
EPIC Encounter for ICM Monitoring  Patient Name: Bradley [name] is a 65 y.o. male Date: 03/14/2021 Primary Care Physican: [name], MD Heart Failure Clinic: [facility] Electrophysiologist: Caryl Comes Nephrologist:  [place] 02/14/2021 Weight: 193 lbs                                                 Attempted call to wife and unable to reach.  Left detailed message per DPR regarding transmission. Transmission reviewed.    CorVue thoracic impedance suggesting possible fluid accmulation since 8/1 and trending back close to baseline.   Prescribed:  Furosemide 20 mg Take 3 tablets (60 mg total) by mouth daily (per Dr Bensimhon's 02/04/2021 note it says take daily) Farxiga 10 mg take 1 tablet by mouth daily    Labs: 02/14/2021 Creatinine 1.26, BUN 20, Potassium 5.2, Sodium 139, GFR >60 10/25/2020 Creatinine 1.27, BUN 24, Potassium 5.2, Sodium 139, GFR >60 10/16/2020 Creatinine 1.45, BUN 22, Potassium 5.2, Sodium 137, GFR 51-59, BNP 4978.0 Care Everywhere A complete set of results can be found in Results Review.   Recommendations:  Left voice mail with ICM number and encouraged to call if experiencing any fluid symptoms.   Follow-up plan: ICM clinic phone appointment on 03/22/2021 to recheck fluid levels.   91 day device clinic remote transmission 06/09/2021.     EP/Cardiology Office Visits:   04/27/2021 with Dr Caryl Comes.     Copy of ICM check sent to Dr. Caryl Comes.   3 month ICM trend: 03/14/2021.    1 Year ICM trend:       [name], RN 03/14/2021 10:06 AM

## 2021-03-15 ENCOUNTER — Other Ambulatory Visit: Payer: Self-pay

## 2021-03-15 ENCOUNTER — Ambulatory Visit (INDEPENDENT_AMBULATORY_CARE_PROVIDER_SITE_OTHER): Payer: Medicare Other | Admitting: Pharmacist

## 2021-03-15 DIAGNOSIS — I10 Essential (primary) hypertension: Secondary | ICD-10-CM

## 2021-03-15 DIAGNOSIS — F339 Major depressive disorder, recurrent, unspecified: Secondary | ICD-10-CM

## 2021-03-15 NOTE — Progress Notes (Signed)
Chronic Care Management Pharmacy Note  03/26/2021 Name:  Bradley [name] MRN:  [id number] DOB:  26-Feb-1956  Summary: TSH slightly above target BP at goal < 130/80 per office readings  Recommendations/Changes made from today's visit: -Consider rechecking TSH and adjust levothyroxine -Provided phone number for behavioral health -Adjusted medication list to reflect taking 1/2 tablet of Abilify  Plan: BP assessment in 2 months  Subjective: Bradley [name] is an 65 y.o. year old male who is a primary patient of Burchette, Alinda Sierras, MD.  The CCM team was consulted for assistance with disease management and care coordination needs.    Engaged with patient face to face for follow up visit in response to provider referral for pharmacy case management and/or care coordination services.   Consent to Services:  The patient was given information about Chronic Care Management services, agreed to services, and gave verbal consent prior to initiation of services.  Please see initial visit note for detailed documentation.   Patient Care Team: [name], MD as PCP - Desma Paganini, MD as PCP - Cardiology (Cardiology) Deboraha Sprang, MD as PCP - Electrophysiology (Cardiology) Bensimhon, Shaune Pascal, MD as PCP - Advanced Heart Failure (Cardiology) [name], MD (Inactive) as Consulting Physician (Cardiothoracic Surgery) Deterding, Jeneen Rinks, MD as Consulting Physician (Nephrology) Deboraha Sprang, MD as Consulting Physician (Cardiology) Viona Gilmore, [facility] as Pharmacist (Pharmacist)  Recent office visits: 11/29/20 [name] MD (PCP) - seen for depression. Patient started on Aripiprazole 67m daily. Follow up in 6 months.   Recent consult visits: 02/14/21 DGlori Bickers MD (cardiology): Patient presented for CHF follow up.   Hospital visits: Medication Reconciliation was completed by comparing discharge summary, patient's EMR and Pharmacy list, and upon discussion  with patient.   [facility] vomiting, syncope, dehydration and heart failure. Patient was recommended to be admitted but left on his own will.    [facility] -[facility] None.    Medication Changes at Hospital Discharge: -Changed None.    Medications Discontinued at Hospital Discharge: -Stopped None.    Medications that remain the same after Hospital Discharge:??  -All other medications will remain the same.    Objective:  Lab Results  Component Value Date   CREATININE 1.26 (H) 02/14/2021   BUN 20 02/14/2021   GFR 66.36 06/11/2019   GFRNONAA >60 02/14/2021   GFRAA 74 04/13/2020   NA 139 02/14/2021   K 4.9 02/14/2021   CALCIUM 9.8 02/14/2021   CO2 27 02/14/2021   GLUCOSE 96 02/14/2021    Lab Results  Component Value Date/Time   HGBA1C 5.9 (H) 12/30/2014 04:52 AM   HGBA1C 4.9 10/27/2013 06:33 PM   GFR 66.36 06/11/2019 10:40 AM   GFR 73.80 05/29/2018 03:13 PM    Last diabetic Eye exam: No results found for: HMDIABEYEEXA  Last diabetic Foot exam: No results found for: HMDIABFOOTEX   Lab Results  Component Value Date   CHOL 136 05/31/2020   HDL 52 05/31/2020   LDLCALC 68 05/31/2020   TRIG 78 05/31/2020   CHOLHDL 2.6 05/31/2020    Hepatic Function Latest Ref Rng & Units 02/14/2021 10/25/2020 05/31/2020  Total Protein 6.5 - 8.1 g/dL 7.1 6.7 6.6  Albumin 3.5 - 5.0 g/dL 4.0 3.8 -  AST 15 - 41 U/L 25 34 19  ALT 0 - 44 U/L 33 53(H) 18  Alk Phosphatase 38 - 126 U/L 57 57 -  Total Bilirubin  0.3 - 1.2 mg/dL 0.7 0.8 0.7  Bilirubin, Direct 0.0 - 0.2 mg/dL - - 0.2    Lab Results  Component Value Date/Time   TSH 4.660 (H) 10/25/2020 12:27 PM   TSH 0.43 05/31/2020 01:59 PM   TSH 1.75 06/11/2019 10:40 AM   FREET4 1.00 10/25/2020 12:37 PM   FREET4 0.98 12/31/2014 08:55 AM    CBC Latest Ref Rng & Units 02/14/2021 10/25/2020 06/11/2019  WBC 4.0 - 10.5 K/uL 7.0 7.1 6.2  Hemoglobin 13.0 -  17.0 g/dL 17.9(H) 17.4(H) 14.0  Hematocrit 39.0 - 52.0 % 54.2(H) 52.2(H) 42.6  Platelets 150 - 400 K/uL 159 150 112.0(L)    No results found for: VD25OH  Clinical ASCVD: Yes  The ASCVD Risk score Mikey Bussing DC Jr., et al., 2013) failed to calculate for the following reasons:   The patient has a prior MI or stroke diagnosis    Depression screen [facility] 2/9 10/22/2020 02/13/2020 05/29/2018  Decreased Interest 2 0 0  Down, Depressed, Hopeless _0 PHQ - 2 Score _1 Altered sleeping 3 3 -  Tired, decreased energy 1 2 -  Change in appetite 1 0 -  Feeling bad or failure about yourself  1 1 -  Trouble concentrating 0 3 -  Moving slowly or fidgety/restless 1 2 -  Suicidal thoughts 0 0 -  PHQ-9 Score 10 12 -  Difficult doing work/chores Somewhat difficult - -  Some recent data might be hidden      Social History   Tobacco Use  Smoking Status Never  Smokeless Tobacco Never   BP Readings from Last 3 Encounters:  02/14/21 130/84  11/29/20 120/70  10/25/20 102/60   Pulse Readings from Last 3 Encounters:  02/14/21 64  11/29/20 (!) 58  10/25/20 62   Wt Readings from Last 3 Encounters:  02/14/21 193 lb 3.2 oz (87.6 kg)  11/29/20 205 lb (93 kg)  10/25/20 199 lb 6.4 oz (90.4 kg)   BMI Readings from Last 3 Encounters:  02/14/21 25.49 kg/m  11/29/20 27.05 kg/m  10/25/20 26.31 kg/m    Assessment/Interventions: Review of patient past medical history, allergies, medications, health status, including review of consultants reports, laboratory and other test data, was performed as part of comprehensive evaluation and provision of chronic care management services.   SDOH:  (Social Determinants of Health) assessments and interventions performed: No  SDOH Screenings   Alcohol Screen: Low Risk    Last Alcohol Screening Score (AUDIT): 0  Depression (PHQ2-9): Medium Risk   PHQ-2 Score: 10  Financial Resource Strain: Not on file  Food Insecurity: Not on file  Housing: Not on file   Physical Activity: Not on file  Social Connections: Moderately Isolated   Frequency of Communication with Friends and Family: Twice a week   Frequency of Social Gatherings with Friends and Family: More than three times a week   Attends Religious Services: Never   Marine scientist or Organizations: No   Attends Music therapist: Never   Marital Status: Married  Stress: Not on file  Tobacco Use: Low Risk    Smoking Tobacco Use: Never   Smokeless Tobacco Use: Never  Transportation Needs: Not on file    [place]  Allergies  Allergen Reactions   Oxycodone Other (See Comments)    Gives patient nightmares   Rifampin Nausea Only    Medications Reviewed Today     Reviewed by Stanford Scotland, RN (Registered Nurse) on  02/14/21 at [place] List Status: <None>   Medication Order Taking? Sig Documenting Provider Last Dose Status Informant  acetaminophen (TYLENOL) 325 MG tablet [id number] Yes Take 325 mg by mouth 2 (two) times daily as needed for moderate pain or headache. [provider] Taking Active Spouse/Significant Other  ARIPiprazole (ABILIFY) 10 MG tablet [id number] Yes Take 1 tablet (10 mg total) by mouth daily. [name], MD Taking Active   aspirin EC 81 MG tablet [id number] Yes Take 81 mg by mouth at bedtime.  [provider] Taking Active Spouse/Significant Other  atorvastatin (LIPITOR) 40 MG tablet [id number] Yes Take 20 mg by mouth daily. [provider] Taking Active   carvedilol (COREG) 6.25 MG tablet [id number] Yes TAKE 1 TABLET BY MOUTH 2 TIMES DAILY WITH A MEAL. Bensimhon, Shaune Pascal, MD Taking Active   clonazePAM (KLONOPIN) 1 MG tablet [id number] Yes TAKE 1 TABLET BY MOUTH TWICE A DAY *NOT FOR REGULAR USE* Burchette, Alinda Sierras, MD Taking Active   docusate sodium (COLACE) 100 MG capsule [id number] Yes Take 100 mg by mouth 2 (two) times daily. [provider] Taking Active Spouse/Significant Other  [facility] [facility])  [id number] Yes Take 237 mLs by mouth daily as needed. [name], MD Taking Active   ENTRESTO 97-103 MG [id number] Yes TAKE 1 TABLET BY MOUTH TWICE A DAY Bensimhon, Shaune Pascal, MD Taking Active   FARXIGA 10 MG TABS tablet [id number] Yes TAKE 1 TABLET BY MOUTH EVERY DAY BEFORE BREAKFAST Bensimhon, Shaune Pascal, MD Taking Active   furosemide (LASIX) 20 MG tablet [id number] Yes Take 60 mg by mouth 3 (three) times a week. Monday and Thursday and Saturday [provider] Taking Active   levothyroxine (SYNTHROID) 175 MCG tablet [id number] Yes TAKE 1 TABLET BY MOUTH EVERY DAY BEFORE BREAKFAST Burchette, Alinda Sierras, MD Taking Active   metolazone (ZAROXOLYN) 2.5 MG tablet [id number] Yes Take 1 tablet as directed. Bensimhon, Shaune Pascal, MD Taking Active   mexiletine (MEXITIL) 150 MG capsule [id number] Yes TAKE 2 CAPSULES BY MOUTH EVERY MORNING AND 2 CAPSULES BY MOUTH EVERY EVENING. Bensimhon, Shaune Pascal, MD Taking Active   ranolazine (RANEXA) 500 MG 12 hr tablet [id number] Yes TAKE 1 TABLET BY MOUTH TWICE A DAY **NEED OFFICE VISIT FOR FUTURE REFILLS** Deboraha Sprang, MD Taking Active   Salicylic Acid (WART REMOVER EX) [id number] Yes Apply 1 application topically daily as needed (foot corns). 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    CKD (chronic kidney disease), stage II    H/O aortic valve repair- 1968 12/07/2013   Non-ischemic cardiomyopathy- EF 20-25% - TTE 11/2013 12/07/2013   Ventricular tachycardia    Elevated troponin- chronic- normal coronaries 11/25/2013   Chronic combined systolic and diastolic CHF (congestive heart failure) ([place]) 10/22/2013   Major  depressive disorder, recurrent, severe with psychotic features- hospitalized in Jan 2013 08/20/2011   Generalized anxiety disorder 08/20/2011   AVR- Deneen Harts 2011, re-do March 2015 secondary to valve endocarditis 12/29/2010   Hypertension  12/29/2010   Hypothyroidism 03/05/2009    Immunization History  Administered Date(s) Administered   Influenza Split 08/18/2011   Influenza, Quadrivalent, Recombinant,  Inj, Pf 05/04/2019   Influenza,inj,Quad PF,6+ Mos 09/11/2013, 03/19/2014, 03/21/2017, 05/29/2018, 05/31/2020   PFIZER(Purple Top)SARS-COV-2 Vaccination 09/29/2019, 10/20/2019, 07/02/2020   Pneumococcal Conjugate-13 06/08/2014   Td 07/31/2004   Tdap 03/06/2016   Patient is now taking 1/2 tablet of Abilify because the full tablet made him feel like a zombie. His wife is currently handling medications and he is unsure of exactly what he is taking. He has noticed a big change with decreasing the dose of Abilify.   Patient does weigh himself every day and is weighing first thing in the morning.  Patient has been trying to limit salt some but does not read package labels. Patient also doesn't have much of an appetite and drinks ensure almost every day.   Conditions to be addressed/monitored:  Hypertension, Hyperlipidemia, Heart Failure, Hypothyroidism, Depression, and Anxiety  Conditions addressed this visit: Hypertension, heart failure, depression  Care Plan : CCM Pharmacy Care Plan  Updates made by Viona Gilmore, [place] since 03/26/2021 12:00 AM     Problem: Problem: Hypertension, Hyperlipidemia, Heart Failure, Hypothyroidism, Depression, and Anxiety      Long-Range Goal: Patient-Specific Goal   Start Date: 03/15/2021  Expected End Date: 03/15/2022  This Visit's Progress: On track  Priority: High  Note:   Current Barriers:  Unable to independently monitor therapeutic efficacy Unable to maintain control of hypothyroidism  Pharmacist Clinical Goal(s):  Patient will achieve  adherence to monitoring guidelines and medication adherence to achieve therapeutic efficacy through collaboration with PharmD and provider.   Interventions: 1:1 collaboration with [name], MD regarding development and update of comprehensive plan of care as evidenced by provider attestation and co-signature Inter-disciplinary care team collaboration (see longitudinal plan of care) Comprehensive medication review performed; medication list updated in electronic medical record  Hypertension (BP goal <130/80) -Controlled -Current treatment: Carvedilol 6.25 mg 1 tablet twice daily -Medications previously tried: unknown  -Current home readings: could not provide; wife checks BP at home -Current dietary habits: limits salt some but does not read package labels -Current exercise habits: did not discuss -Denies hypotensive/hypertensive symptoms -Educated on BP goals and benefits of medications for prevention of heart attack, stroke and kidney damage; Exercise goal of 150 minutes per week; Importance of home blood pressure monitoring; Proper BP monitoring technique; -Counseled to monitor BP at home weekly, document, and provide log at future appointments -Counseled on diet and exercise extensively Recommended to continue current medication  Hyperlipidemia: (LDL goal < 70) -Controlled -Current treatment: Atorvastatin 40 mg 1 tablet daily -Medications previously tried: none  -Current dietary patterns: did not discuss -Current exercise habits: did not discuss -Educated on Cholesterol goals;  Importance of limiting foods high in cholesterol; Exercise goal of 150 minutes per week; -Counseled on diet and exercise extensively Recommended to continue current medication  Heart Failure (Goal: manage symptoms and prevent exacerbations) -Controlled -Last ejection fraction: 35-40% (Date: 02/14/21) -HF type: Systolic -NYHA Class: II (slight limitation of activity) -AHA HF Stage: C (Heart  disease and symptoms present) -Current treatment: Carvedilol 6.25 mg 1 tablet twice daily Furosemide 20 mg 3 tablets three times a week Entresto 97-103 mg 1 tablet twice daily Metolazone 2.5 mg as directed -Medications previously tried: unknown  -Current home BP/HR readings: patient could not provide -Current dietary habits: limits salt some but does not read package labels -Current exercise habits: did not discuss -Educated on Benefits of medications for managing symptoms and prolonging life Proper diuretic administration and potassium supplementation Importance of blood pressure control -Counseled on diet and  exercise extensively Recommended to continue current medication  Chest pain/arrhythmia (Goal: minimize symptoms) -Controlled -Current treatment  Ranolazine  500 mg 1 tablet twice daily Mexiletine 150 mg 2 capsules in the morning and 2 capsules in the evening -Medications previously tried: none  -Recommended to continue current medication   Depression/Anxiety (Goal: minimize symptoms) -Controlled -Current treatment: Sertraline 50 mg 1 tablet daily Abilify 10 mg 1/2 tablet daily Clonazepam 1 mg 1 tablet twice daily as needed -Medications previously tried/failed: could not recall -PHQ9: 10 -GAD7: n/a -Educated on Benefits of medication for symptom control Benefits of cognitive-behavioral therapy with or without medication -Counseled on diet and exercise extensively Recommended to continue current medication Provided phone number for behavioral health.  Hypothyroidism (Goal: TSH 0.35-4.5) -Uncontrolled -Current treatment  Levothyroxine 175 mcg 1 tablet daily before breakfast -Medications previously tried: none  -Recommended rechecking TSH and adjusting levothyroxine dose.  Health Maintenance -Vaccine gaps: shingrix, Prevnar, COVID booster -Current therapy:  Aspirin 81 mg 1 tablet daily Docusate 100 mg 1 capsule twice daily -Educated on Cost vs benefit of each  product must be carefully weighed by individual consumer -Patient is satisfied with current therapy and denies issues -Recommended to continue current medication  Patient Goals/Self-Care Activities Patient will:  - take medications as prescribed check blood pressure weekly, document, and provide at future appointments weigh daily, and contact provider if weight gain of > 3 lbs in one day or > 5 lbs in one week target a minimum of 150 minutes of moderate intensity exercise weekly  Follow Up Plan: The care management team will reach out to the patient again over the next 60 days.        Medication Assistance: None required.  Patient affirms current coverage meets needs.  Compliance/Adherence/Medication fill history: Care Gaps: Shingrix, HIV screening, prevnar, COVID booster, influenza  Star-Rating Drugs: Atorvastatin 12m - [facility]  Farxiga 174m- last filled [facility]/03/[facility] Entresto [id number] last filled on [facility]/07/21 [facility]  [facility]:  [id number] [place] [facility] [address] [place] [id number]: [phone]: [address] [id number] [id number]: [phone]: [phone] pill box? Yes Pt endorses 97% compliance - once a month  We discussed: Current pharmacy is preferred with insurance plan and patient is satisfied with pharmacy services Patient decided to: Continue current medication management strategy  Care Plan and Follow Up Patient Decision:  Patient agrees to Care Plan and Follow-up.  Plan: The care management team will reach out to the patient again over the next 60 days.  MadeJeni SallesarmD, BCACColonyrmacist [place]

## 2021-03-15 NOTE — Patient Instructions (Addendum)
Here is the number for behavioral health to find a counselor: [phone] Look at getting the shingles shot and COVID booster at the [place], PharmD, [place] Pharmacist [place] at [place]

## 2021-03-22 ENCOUNTER — Ambulatory Visit (INDEPENDENT_AMBULATORY_CARE_PROVIDER_SITE_OTHER): Payer: Medicare Other

## 2021-03-22 DIAGNOSIS — I5022 Chronic systolic (congestive) heart failure: Secondary | ICD-10-CM

## 2021-03-22 DIAGNOSIS — Z95810 Presence of automatic (implantable) cardiac defibrillator: Secondary | ICD-10-CM

## 2021-03-25 NOTE — Progress Notes (Signed)
EPIC Encounter for ICM Monitoring  Patient Name: Bradley [name] is a 65 y.o. male Date: 03/25/2021 Primary Care Physican: [name], MD Heart Failure Clinic: [place] Electrophysiologist: Caryl Comes Nephrologist:  [place] 02/14/2021 Weight: 193 lbs                                                 Spoke with wife and heart failure questions reviewed.  Pt asymptomatic for fluid accumulation and feeling well.  They have been eating restaurant foods most of the time and explained these type of foods are very high in salt.    CorVue thoracic impedance normal but was suggesting possible fluid accmulation since 8/1 to 8/19.   Prescribed:  Furosemide 20 mg Take 60 mg by mouth 3 (three) times a week. Monday and Thursday and Saturday Farxiga 10 mg take 1 tablet by mouth daily    Labs: 02/14/2021 Creatinine 1.26, BUN 20, Potassium 5.2, Sodium 139, GFR >60 10/25/2020 Creatinine 1.27, BUN 24, Potassium 5.2, Sodium 139, GFR >60 10/16/2020 Creatinine 1.45, BUN 22, Potassium 5.2, Sodium 137, GFR 51-59, BNP 4978.0 Care Everywhere A complete set of results can be found in Results Review.   Recommendations: Recommendation to limit salt intake to 2000 mg daily and fluid intake to 64 oz daily.  Encouraged to call if experiencing any fluid symptoms.    Follow-up plan: ICM clinic phone appointment on 04/18/2022.   91 day device clinic remote transmission 06/09/2021.     EP/Cardiology Office Visits:   04/27/2021 with Dr Caryl Comes.     Copy of ICM check sent to Dr. Caryl Comes.    3 month ICM trend: 03/22/2021.    1 Year ICM trend:       [name], RN 03/25/2021 4:56 PM

## 2021-03-31 NOTE — Progress Notes (Signed)
Remote ICD transmission.   

## 2021-04-08 DIAGNOSIS — I5042 Chronic combined systolic (congestive) and diastolic (congestive) heart failure: Secondary | ICD-10-CM | POA: Diagnosis not present

## 2021-04-18 ENCOUNTER — Ambulatory Visit (INDEPENDENT_AMBULATORY_CARE_PROVIDER_SITE_OTHER): Payer: Medicare Other

## 2021-04-18 DIAGNOSIS — Z95810 Presence of automatic (implantable) cardiac defibrillator: Secondary | ICD-10-CM

## 2021-04-18 DIAGNOSIS — I5022 Chronic systolic (congestive) heart failure: Secondary | ICD-10-CM

## 2021-04-19 NOTE — Progress Notes (Signed)
EPIC Encounter for ICM Monitoring  Patient Name: Bradley [name] is a 65 y.o. male Date: 04/19/2021 Primary Care Physican: [name], MD Heart Failure Clinic: [place] Electrophysiologist: Caryl Comes Nephrologist:  [place] 02/14/2021 Weight: 193 lbs                                                 Spoke with wife and heart failure questions reviewed.  Pt asymptomatic for fluid accumulation and feeling well.   CorVue thoracic impedance suggeting normal fluid levels.   Prescribed:  Furosemide 20 mg Take 60 mg by mouth 3 (three) times a week. Monday and Thursday and Saturday Farxiga 10 mg take 1 tablet by mouth daily    Labs: 02/14/2021 Creatinine 1.26, BUN 20, Potassium 5.2, Sodium 139, GFR >60 10/25/2020 Creatinine 1.27, BUN 24, Potassium 5.2, Sodium 139, GFR >60 10/16/2020 Creatinine 1.45, BUN 22, Potassium 5.2, Sodium 137, GFR 51-59, BNP 4978.0 Care Everywhere A complete set of results can be found in Results Review.   Recommendations: No changes and encouraged to call if experiencing any fluid symptoms.   Follow-up plan: ICM clinic phone appointment on 05/23/2021.   91 day device clinic remote transmission 06/09/2021.     EP/Cardiology Office Visits:   04/27/2021 with Dr Caryl Comes.     Copy of ICM check sent to Dr. Caryl Comes.     3 month ICM trend: 04/18/2021.    1 Year ICM trend:       [name], RN 04/19/2021 9:11 AM

## 2021-04-27 ENCOUNTER — Encounter: Payer: Self-pay | Admitting: Internal Medicine

## 2021-04-27 ENCOUNTER — Ambulatory Visit (INDEPENDENT_AMBULATORY_CARE_PROVIDER_SITE_OTHER): Payer: Medicare Other | Admitting: Internal Medicine

## 2021-04-27 ENCOUNTER — Other Ambulatory Visit: Payer: Self-pay

## 2021-04-27 VITALS — BP 114/68 | HR 64 | Ht 73.0 in | Wt 198.6 lb

## 2021-04-27 DIAGNOSIS — I472 Ventricular tachycardia, unspecified: Secondary | ICD-10-CM

## 2021-04-27 DIAGNOSIS — D751 Secondary polycythemia: Secondary | ICD-10-CM

## 2021-04-27 DIAGNOSIS — Z95810 Presence of automatic (implantable) cardiac defibrillator: Secondary | ICD-10-CM | POA: Diagnosis not present

## 2021-04-27 DIAGNOSIS — I428 Other cardiomyopathies: Secondary | ICD-10-CM | POA: Diagnosis not present

## 2021-04-27 DIAGNOSIS — Z79899 Other long term (current) drug therapy: Secondary | ICD-10-CM | POA: Diagnosis not present

## 2021-04-27 DIAGNOSIS — I5042 Chronic combined systolic (congestive) and diastolic (congestive) heart failure: Secondary | ICD-10-CM

## 2021-04-27 NOTE — Progress Notes (Signed)
Patient Care Team: [name], MD as PCP - Desma Paganini, MD as PCP - Cardiology (Cardiology) Deboraha Sprang, MD as PCP - Electrophysiology (Cardiology) Bensimhon, Shaune Pascal, MD as PCP - Advanced Heart Failure (Cardiology) [name], MD (Inactive) as Consulting Physician (Cardiothoracic Surgery) Deterding, Jeneen Rinks, MD as Consulting Physician (Nephrology) Deboraha Sprang, MD as Consulting Physician (Cardiology) Viona Gilmore, [facility] as Pharmacist (Pharmacist)   HPI  Bradley [name] is a 65 y.o. male Seen in follow-up for implantable defibrillator [employer]  for sustained ventricular tachycardia 5/16 occurring with a frequency that required adjunctive medical therapy. He ended up on amiodarone but then switched to mex and ranolazine.   A balloon valvotomy at age 71 for presumed congenital aortic stenosis   Bentall procedure in 2001 with a pericardial tissue valve.  He had MSSA bacteremia and bacterial endocarditis in 02/1190 complicated by renal failure requiring dialysis for a short period of time.  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  There have been no palpitations, lightheadedness or syncope.  Complains of some diaphoresis.  No post shower pruritus.  No neurological symptoms     History of appropriate therapy: yes - ATP Antiarrhythmics Date Reason stopped  Amiodarone  decision   Mexiletine  2016 ongoing  Ranolazine 2016  ongoing   Date Cr K Dig TSH Hgb  10/19 1.08 4.5  2.57    11/20 1.12 4.7  1.75   7/22 1.26 4.9   17.9   DATE TEST EF   10/14 Echo  60-65 %   6/16 Echo  30-35 %   5/18 Echo  25-30%   7/19 Echo  30-35%   7/22 Echo  35-40%          Past Medical History:  Diagnosis Date   Anxiety     Aortic stenosis    s/p Bentall with bioprosthetic AVR 02/2010; Last echo (9/11): Moderate LVH, EF 45-50%, AVR functioning appropriately, aortic valve mean gradient 21, diastolic dysfunction. Chest MRA (2/13): Mild to moderate dilatation at the sinus of Valsalva at 4.1 cm, mild dilatation ascending aorta distal to the tube graft at 3.9 cm, moderate dilatation of the innominate artery a 2.1 cm;     CHF (congestive heart failure) ([place])    Depression    ESRD on dialysis ([place])    a. 09/2013 felt to be related to gentamycin b. no longer requiring HD   Hx of cardiac cath    a. LHC in 02/2010: normal cors   Hx of echocardiogram 02/2014   Echo (8/15):  Mod LV, EF 35-40%, Gr 1 DD, AVR ok (mean 14 mmHg), mild LAE   Hypertension    Hypothyroidism, postsurgical    Prosthetic valve endocarditis (HCC)    Staphylococcus aureus bacteremia    Thyroid cancer ([place])    Ventricular tachycardia ([place])    a. s/p STJ ICD    Past Surgical History:  Procedure Laterality Date   AORTIC VALVE REPAIR  1968   AORTIC VALVE REPLACEMENT  2011   AV FISTULA PLACEMENT Left 10/08/2013   Procedure: ARTERIOVENOUS (AV) FISTULA CREATION- LEFT ARM; Radial Cephalic ;  Surgeon: Mal [name], MD;  Location: [place];  Service: Vascular;  Laterality: Left;   BENTALL PROCEDURE N/A 10/28/2013  Procedure: REDO BENTALL PROCEDURE, debridment of aoritc root abscess, replacement of aortic root, ascending aorta and aortic valve with homograft. Insertion of left femoral arterial line;  Surgeon: [name], MD;  Location: [place];  Service: Open Heart Surgery;  Laterality: N/A;   BIOPSY  07/22/2019   Procedure: BIOPSY;  Surgeon: Doran Stabler, MD;  Location: [facility];  Service: Gastroenterology;;   CARDIAC CATHETERIZATION  03/02/2010   NORMAL CORONARY ARTERY   CARDIAC CATHETERIZATION N/A 01/01/2015   Procedure: Coronary/Graft Angiography;  Surgeon: Troy Sine, MD;  Location: [place] [facility];  Service: Cardiovascular;  Laterality:  N/A;   CARDIAC VALVE REPLACEMENT     COLONOSCOPY WITH PROPOFOL N/A 07/22/2019   Procedure: COLONOSCOPY WITH PROPOFOL;  Surgeon: Doran Stabler, MD;  Location: [facility];  Service: Gastroenterology;  Laterality: N/A;   EP IMPLANTABLE DEVICE N/A 01/01/2015   Procedure: ICD Implant;  Surgeon: Deboraha Sprang, MD;  Location: [place] [facility];  Service: Cardiovascular;  Laterality: N/A;   HEMOSTASIS CLIP PLACEMENT  07/22/2019   Procedure: HEMOSTASIS CLIP PLACEMENT;  Surgeon: Doran Stabler, MD;  Location: [facility];  Service: Gastroenterology;;   INTRAOPERATIVE TRANSESOPHAGEAL ECHOCARDIOGRAM N/A 10/28/2013   Procedure: INTRAOPERATIVE TRANSESOPHAGEAL ECHOCARDIOGRAM;  Surgeon: [name], MD;  Location: [place];  Service: Open Heart Surgery;  Laterality: N/A;   PERIPHERAL VASCULAR CATHETERIZATION N/A 01/01/2015   Procedure: Aortic Arch Angiography;  Surgeon: Troy Sine, MD;  Location: [place] [facility];  Service: Cardiovascular;  Laterality: N/A;   POLYPECTOMY  07/22/2019   Procedure: POLYPECTOMY;  Surgeon: Doran Stabler, MD;  Location: [facility];  Service: Gastroenterology;;   SHOULDER ARTHROSCOPY W/ ROTATOR CUFF REPAIR Right 2012   STERNOTOMY     REDO   TEE WITHOUT CARDIOVERSION N/A 10/24/2013   Procedure: TRANSESOPHAGEAL ECHOCARDIOGRAM (TEE);  Surgeon: Dorothy Spark, MD;  Location: [place];  Service: Cardiovascular;  Laterality: N/A;   THYROIDECTOMY  ~ 2005   TRANSTHORACIC ECHOCARDIOGRAM  03/2010   SHOWED MILD REDUCTION OF LV FUNCTION    Current Outpatient Medications  Medication Sig Dispense Refill   acetaminophen (TYLENOL) 325 MG tablet Take 325 mg by mouth 2 (two) times daily as needed for moderate pain or headache.     ARIPiprazole (ABILIFY) 10 MG tablet Take 1 tablet (10 mg total) by mouth daily. (Patient taking differently: Take 5 mg by mouth daily.) 90 tablet 3   aspirin EC 81 MG tablet Take 81 mg by mouth at bedtime.      atorvastatin (LIPITOR) 40  MG tablet Take 20 mg by mouth daily.     carvedilol (COREG) 6.25 MG tablet TAKE 1 TABLET BY MOUTH 2 TIMES DAILY WITH A MEAL. 180 tablet 3   clonazePAM (KLONOPIN) 1 MG tablet TAKE 1 TABLET BY MOUTH TWICE A DAY *NOT FOR REGULAR USE* 30 tablet 0   docusate sodium (COLACE) 100 MG capsule Take 100 mg by mouth 2 (two) times daily.     Ensure (ENSURE) Take 237 mLs by mouth daily as needed. 237 mL 12   ENTRESTO 97-103 MG TAKE 1 TABLET BY MOUTH TWICE A DAY 180 tablet 3   FARXIGA 10 MG TABS tablet TAKE 1 TABLET BY MOUTH EVERY DAY BEFORE BREAKFAST 90 tablet 3   furosemide (LASIX) 20 MG tablet Take 60 mg by mouth 3 (three) times a week. Monday and Thursday and Saturday     levothyroxine (SYNTHROID) 175 MCG tablet TAKE 1 TABLET BY MOUTH EVERY  DAY BEFORE BREAKFAST 90 tablet 1   metolazone (ZAROXOLYN) 2.5 MG tablet Take 1 tablet as directed. 1 tablet 0   mexiletine (MEXITIL) 150 MG capsule TAKE 2 CAPSULES BY MOUTH EVERY MORNING AND 2 CAPSULES BY MOUTH EVERY EVENING. 360 capsule 3   ranolazine (RANEXA) 500 MG 12 hr tablet TAKE 1 TABLET BY MOUTH TWICE A DAY **NEED OFFICE VISIT FOR FUTURE REFILLS** 189 tablet 1   Salicylic Acid (WART REMOVER EX) Apply 1 application topically daily as needed (foot corns).     sertraline (ZOLOFT) 50 MG tablet TAKE 1 TABLET BY MOUTH EVERY DAY 90 tablet 2   No current facility-administered medications for this visit.    Allergies  Allergen Reactions   Oxycodone Other (See Comments)    Gives patient nightmares   Rifampin Nausea Only      Review of Systems negative except from HPI and PMH  Physical Exam BP 114/68   Pulse 64   Ht 6\' 1"  (1.854 m)   Wt 198 lb 9.6 oz (90.1 kg)   SpO2 95%   BMI 26.20 kg/m  Well developed and well nourished in no acute distress HENT normal Neck supple with JVP-flat Clear Device pocket well healed; without hematoma or erythema.  There is no tethering  Regular rate and rhythm, no  murmur Abd-soft with active BS No Clubbing cyanosis   edema Skin-warm and dry A & Oriented  Grossly normal sensory and motor function  ECG sinus at 64 Intervals 18/15/41 Q waves 2 3 and F T wave inversions to 3 out of the 5 and V6  Assessment and  Plan  Aortic Valve Disease with valvotomy, Bentall 2011 with redo Bentall 2015  Ventricular tachycarda  ICD St Jude  The patient's device was interrogated.  The information was reviewed. No changes were made in the programming.      NICM  CHF chronic systolic  Polycythemia  No interval ventricular tachycardia.  Continue mexiletine 300 twice daily and ranolazine 500 twice daily  He is euvolemic.  We will continue Lasix 60 mg 3 times a week.  For his cardiomyopathy, continue carvedilol 6.25 twice daily Entresto 97/103.  With his ejection fraction better we will hold off on spironolactone  He is noted to have polycythemia.  This is new since 11/20 and has been confirmed at least on one blood draw.  I have reached out to hematology to ask if the next steps.  His oxygen saturation is normal at rest and with modest exertion.  We will repeat his CBC today and a serum erythropoietin level  Hematology responded that consultation is appropriate.  We will arrange

## 2021-04-27 NOTE — Patient Instructions (Signed)
Medication Instructions:  Your physician recommends that you continue on your current medications as directed. Please refer to the Current Medication list given to you today.  *If you need a refill on your cardiac medications before your next appointment, please call your pharmacy*   Lab Work: Serum Erythropoietin and CBC today If you have labs (blood work) drawn today and your tests are completely normal, you will receive your results only by: [place] (if you have MyChart) OR A paper copy in the mail If you have any lab test that is abnormal or we need to change your treatment, we will call you to review the results.   Testing/Procedures: None ordered.    Follow-Up: At [facility], you and your health needs are our priority.  As part of our continuing mission to provide you with exceptional heart care, we have created designated Provider Care Teams.  These Care Teams include your primary Cardiologist (physician) and Advanced Practice Providers (APPs -  Physician Assistants and Nurse Practitioners) who all work together to provide you with the care you need, when you need it.  We recommend signing up for the patient portal called "MyChart".  Sign up information is provided on this After Visit Summary.  MyChart is used to connect with patients for Virtual Visits (Telemedicine).  Patients are able to view lab/test results, encounter notes, upcoming appointments, etc.  Non-urgent messages can be sent to your provider as well.   To learn more about what you can do with MyChart, go to [email or web address].    Your next appointment:   12 month(s)  The format for your next appointment:   In Person  Provider:   [name], MD

## 2021-04-28 ENCOUNTER — Encounter: Payer: Self-pay | Admitting: *Deleted

## 2021-04-28 LAB — CBC
Hematocrit: 51.3 % — ABNORMAL HIGH (ref 37.5–51.0)
Hemoglobin: 16.9 g/dL (ref 13.0–17.7)
MCH: 31.1 pg (ref 26.6–33.0)
MCHC: 32.9 g/dL (ref 31.5–35.7)
MCV: 94 fL (ref 79–97)
Platelets: 166 10*3/uL (ref 150–450)
RBC: 5.44 x10E6/uL (ref 4.14–5.80)
RDW: 13.2 % (ref 11.6–15.4)
WBC: 8.4 10*3/uL (ref 3.4–10.8)

## 2021-04-28 LAB — ERYTHROPOIETIN: Erythropoietin: 7.8 m[IU]/mL (ref 2.6–18.5)

## 2021-04-28 NOTE — Progress Notes (Signed)
Referral received and medical records under review for Hematology appt

## 2021-05-02 ENCOUNTER — Other Ambulatory Visit: Payer: Self-pay | Admitting: *Deleted

## 2021-05-02 DIAGNOSIS — D45 Polycythemia vera: Secondary | ICD-10-CM

## 2021-05-02 NOTE — Progress Notes (Signed)
Referral received and patient scheduled on 10/17 for Hematology appt and labs ordered for 10/14.

## 2021-05-10 DIAGNOSIS — I5042 Chronic combined systolic (congestive) and diastolic (congestive) heart failure: Secondary | ICD-10-CM | POA: Diagnosis not present

## 2021-05-12 ENCOUNTER — Telehealth: Payer: Self-pay | Admitting: Pharmacist

## 2021-05-12 NOTE — Chronic Care Management (AMB) (Signed)
Chronic Care Management Pharmacy Assistant   Name: Bradley [name]  MRN: [id number] DOB: 1956/05/31  Reason for Encounter: Disease State/ Hypertension Assessment Call.   Conditions to be addressed/monitored: HTN   Recent office visits:  None.  Recent consult visits:  04/27/21 [name] MD (Cardiology) - seen for ventricular tachycardia and other issues. Referral to hematology/oncology placed. No medication changes. Follow up in 1 year.  Hospital visits:  None in previous 6 months  Medications: Outpatient Encounter Medications as of 05/12/2021  Medication Sig   acetaminophen (TYLENOL) 325 MG tablet Take 325 mg by mouth 2 (two) times daily as needed for moderate pain or headache.   ARIPiprazole (ABILIFY) 10 MG tablet Take 1 tablet (10 mg total) by mouth daily. (Patient taking differently: Take 5 mg by mouth daily.)   aspirin EC 81 MG tablet Take 81 mg by mouth at bedtime.    atorvastatin (LIPITOR) 40 MG tablet Take 20 mg by mouth daily.   carvedilol (COREG) 6.25 MG tablet TAKE 1 TABLET BY MOUTH 2 TIMES DAILY WITH A MEAL.   clonazePAM (KLONOPIN) 1 MG tablet TAKE 1 TABLET BY MOUTH TWICE A DAY *NOT FOR REGULAR USE*   docusate sodium (COLACE) 100 MG capsule Take 100 mg by mouth 2 (two) times daily.   Ensure (ENSURE) Take 237 mLs by mouth daily as needed.   ENTRESTO 97-103 MG TAKE 1 TABLET BY MOUTH TWICE A DAY   FARXIGA 10 MG TABS tablet TAKE 1 TABLET BY MOUTH EVERY DAY BEFORE BREAKFAST   furosemide (LASIX) 20 MG tablet Take 60 mg by mouth 3 (three) times a week. Monday and Thursday and Saturday   levothyroxine (SYNTHROID) 175 MCG tablet TAKE 1 TABLET BY MOUTH EVERY DAY BEFORE BREAKFAST   metolazone (ZAROXOLYN) 2.5 MG tablet Take 1 tablet as directed.   mexiletine (MEXITIL) 150 MG capsule TAKE 2 CAPSULES BY MOUTH EVERY MORNING AND 2 CAPSULES BY MOUTH EVERY EVENING.   ranolazine (RANEXA) 500 MG 12 hr tablet TAKE 1 TABLET BY MOUTH TWICE A DAY **NEED OFFICE VISIT FOR FUTURE REFILLS**    Salicylic Acid (WART REMOVER EX) Apply 1 application topically daily as needed (foot corns).   sertraline (ZOLOFT) 50 MG tablet TAKE 1 TABLET BY MOUTH EVERY DAY   No facility-administered encounter medications on file as of 05/12/2021.   Fill History: ARIPIPRAZOLE 10 MG TABLET 02/27/2021 90   ATORVASTATIN CALCIUM  40 MG TABS 10/28/2020 90   CARVEDILOL 6.25 MG TABLET 04/03/2021 90   FARXIGA 10 MG TABLET 04/28/2021 90   DIGOXIN 125 MCG TABLET 09/26/2020 90   FUROSEMIDE 20 MG TABLET 04/09/2021 84   LEVOTHYROXINE 175 MCG TABLET 03/17/2021 90   MEXILETINE 150 MG CAPSULE 03/21/2021 90   RANOLAZINE ER 500 MG TABLET 02/18/2021 90   ENTRESTO 97 MG-103 MG TABLET 05/05/2021 90   SERTRALINE HCL 50 MG TABLET 02/23/2021 90   CLONAZEPAM 1 MG TABLET 08/30/2020 30   Reviewed chart prior to disease state call. Spoke with patient regarding BP  Recent Office Vitals: BP Readings from Last 3 Encounters:  04/27/21 114/68  02/14/21 130/84  11/29/20 120/70   Pulse Readings from Last 3 Encounters:  04/27/21 64  02/14/21 64  11/29/20 (!) 58    Wt Readings from Last 3 Encounters:  04/27/21 198 lb 9.6 oz (90.1 kg)  02/14/21 193 lb 3.2 oz (87.6 kg)  11/29/20 205 lb (93 kg)     Kidney Function Lab Results  Component Value Date/Time   CREATININE 1.26 (H)  02/14/2021 01:08 PM   CREATININE 1.27 (H) 10/25/2020 12:37 PM   CREATININE 1.28 10/14/2015 12:28 PM   CREATININE 1.21 07/12/2015 12:07 PM   GFR 66.36 06/11/2019 10:40 AM   GFRNONAA >60 02/14/2021 01:08 PM   GFRAA 74 04/13/2020 04:09 PM    BMP Latest Ref Rng & Units 02/14/2021 10/25/2020 04/13/2020  Glucose 70 - 99 mg/dL 96 97 70  BUN 8 - 23 mg/dL 20 24(H) 21  Creatinine 0.61 - 1.24 mg/dL 1.26(H) 1.27(H) 1.20  BUN/Creat Ratio 10 - 24 - - 18  Sodium 135 - 145 mmol/L 139 139 143  Potassium 3.5 - 5.1 mmol/L 4.9 5.2(H) 5.1  Chloride 98 - 111 mmol/L 102 103 105  CO2 22 - 32 mmol/L 27 30 27   Calcium 8.9 - 10.3 mg/dL 9.8 10.0 9.8     Current antihypertensive regimen:  Carvedilol 6.25mg  - take 1 tablet by mouth two times daily. How often are you checking your Blood Pressure? weekly Current home BP readings: 05/08/21 103/73, 05/06/21 119/79, 05/04/21 112/69, 04/24/21 122/81, 04/23/21 106/78.  What recent interventions/DTPs have been made by any provider to improve Blood Pressure control since last CPP Visit: None. Any recent hospitalizations or ED visits since last visit with CPP? No  Adherence Review: Is the patient currently on ACE/ARB medication? Yes Does the patient have >5 day gap between last estimated fill dates? No  Notes:  Spoke with Sonia Baller at [facility] who sated patient last filled the atorvastatin on 08/03/20 for a 90 day supply.  Called [employer] and patient is not listed in their database.  Spoke with patients wife [name] and went over patients medications as prescribed. [name] states tat patient is only taking half of his atorvastatin tablet because a whole makes his legs ache. She is now down to only 5mg  of the abilify as well. Everything else is the same. Patient mostly eats waffle or banana bread for breakfast. He does not really have an appetite but loves his sweets. He had a rotisserie chicken, carrots and peas and some stuffing for dinner last night. Patient has been eating more fruit and had a pear last night. Patient is not very active per [facility]. [name] thanked me for my call.   Care Gaps:  AWV - scheduled for 10/24/21 HIV screening - never done Zoster vaccines - never done Covid-19 vaccine booster 4 - overdue since 09/24/20 Flu vaccine - due Last pcp BP: 120/70 P: 58.   Star Rating /Drugs:  Atorvastatin 40mg  - last filled on 10/28/20 90[facility]  - last filled on 04/28/21 90DS at [facility] Pharmacist Assistant [phone]

## 2021-05-13 ENCOUNTER — Inpatient Hospital Stay: Payer: Medicare Other | Attending: Nurse Practitioner

## 2021-05-13 DIAGNOSIS — D751 Secondary polycythemia: Secondary | ICD-10-CM | POA: Insufficient documentation

## 2021-05-13 NOTE — Progress Notes (Signed)
New Hematology/Oncology Consult   Requesting MD: Dr. Adam Phenix  [phone]  Reason for Consult: Polycythemia  HPI: Bradley [name] is a 65 year old man referred for evaluation of polycythemia.  He had labs done on 02/14/2021 at which time the hemoglobin was 17.9, hematocrit 54.2.  White count and platelets in normal range.  Follow-up CBC on 04/27/2021 showed hemoglobin 16.9, hematocrit 51.3, erythropoietin level 7.8 (2.6-18.5).  White count and platelets in normal range.  Review of labs in the medical record show hemoglobin 17.4, hematocrit 52.2 on 10/25/2020.  Review of CBCs from 06/11/2019 to 01/02/2015 show hemoglobin/hematocrit in normal range.  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   Past Medical History:  Diagnosis Date   Anxiety    Aortic stenosis    s/p Bentall with bioprosthetic AVR 02/2010; Last echo (9/11): Moderate LVH, EF 45-50%, AVR functioning appropriately, aortic valve mean gradient 21, diastolic dysfunction. Chest MRA (2/13): Mild to moderate dilatation at the sinus of Valsalva at 4.1 cm, mild dilatation ascending aorta distal to the tube graft at 3.9 cm, moderate dilatation of the innominate artery a 2.1 cm;     CHF (congestive heart failure) ([place])    Depression    ESRD on dialysis ([place])    a. 09/2013 felt to be related to gentamycin b. no longer requiring HD   Hx of cardiac cath    a. LHC in 02/2010: normal cors   Hx of echocardiogram 02/2014   Echo (8/15):  Mod LV, EF 35-40%, Gr 1 DD, AVR ok (mean 14 mmHg), mild LAE   Hypertension     Hypothyroidism, postsurgical    Prosthetic valve endocarditis (HCC)    Staphylococcus aureus bacteremia    Thyroid cancer (HCC)    Ventricular tachycardia    a. s/p STJ ICD  :   Past Surgical History:  Procedure Laterality Date   AORTIC VALVE REPAIR  1968   AORTIC VALVE REPLACEMENT  2011   AV FISTULA PLACEMENT Left 10/08/2013   Procedure: ARTERIOVENOUS (AV) FISTULA CREATION- LEFT ARM; Radial Cephalic ;  Surgeon: Mal [name], MD;  [facility];  Service: Vascular;  Laterality: Left;   BENTALL PROCEDURE N/A 10/28/2013   Procedure: REDO BENTALL PROCEDURE, debridment of aoritc root abscess, replacement of aortic root, ascending aorta and aortic valve with homograft. Insertion of left femoral arterial line;  Surgeon: [name], MD;  Location: [place];  Service: Open Heart Surgery;  Laterality: N/A;   BIOPSY  07/22/2019   Procedure: BIOPSY;  Surgeon: Doran Stabler, MD;  Location: [facility];  Service: Gastroenterology;;   CARDIAC CATHETERIZATION  03/02/2010   NORMAL CORONARY ARTERY   CARDIAC CATHETERIZATION N/A 01/01/2015   Procedure: Coronary/Graft Angiography;  Surgeon: Troy Sine, MD;  Location: [place] [facility];  Service: Cardiovascular;  Laterality: N/A;   CARDIAC VALVE REPLACEMENT     COLONOSCOPY WITH PROPOFOL N/A 07/22/2019   Procedure: COLONOSCOPY WITH PROPOFOL;  Surgeon: Doran Stabler, MD;  Location: [facility];  Service: Gastroenterology;  Laterality: N/A;   EP IMPLANTABLE DEVICE N/A 01/01/2015   Procedure: ICD Implant;  Surgeon: Deboraha Sprang, MD;  Location: [facility] [facility];  Service:  Cardiovascular;  Laterality: N/A;   HEMOSTASIS CLIP PLACEMENT  07/22/2019   Procedure: HEMOSTASIS CLIP PLACEMENT;  Surgeon: Doran Stabler, MD;  [facility];  Service: Gastroenterology;;   INTRAOPERATIVE TRANSESOPHAGEAL ECHOCARDIOGRAM N/A 10/28/2013   Procedure: INTRAOPERATIVE TRANSESOPHAGEAL ECHOCARDIOGRAM;  Surgeon: [name], MD;  Location: [place];   Service: Open Heart Surgery;  Laterality: N/A;   PERIPHERAL VASCULAR CATHETERIZATION N/A 01/01/2015   Procedure: Aortic Arch Angiography;  Surgeon: Troy Sine, MD;  Location: [place] [facility];  Service: Cardiovascular;  Laterality: N/A;   POLYPECTOMY  07/22/2019   Procedure: POLYPECTOMY;  Surgeon: Doran Stabler, MD;  [facility];  Service: Gastroenterology;;   SHOULDER ARTHROSCOPY W/ ROTATOR CUFF REPAIR Right 2012   STERNOTOMY     REDO   TEE WITHOUT CARDIOVERSION N/A 10/24/2013   Procedure: TRANSESOPHAGEAL ECHOCARDIOGRAM (TEE);  Surgeon: Dorothy Spark, MD;  Location: [place];  Service: Cardiovascular;  Laterality: N/A;   THYROIDECTOMY  ~ 2005   TRANSTHORACIC ECHOCARDIOGRAM  03/2010   SHOWED MILD REDUCTION OF LV FUNCTION  :   Current Outpatient Medications:    acetaminophen (TYLENOL) 325 MG tablet, Take 325 mg by mouth 2 (two) times daily as needed for moderate pain or headache., Disp: , Rfl:    ARIPiprazole (ABILIFY) 10 MG tablet, Take 1 tablet (10 mg total) by mouth daily. (Patient taking differently: Take 5 mg by mouth daily.), Disp: 90 tablet, Rfl: 3   aspirin EC 81 MG tablet, Take 81 mg by mouth at bedtime. , Disp: , Rfl:    atorvastatin (LIPITOR) 40 MG tablet, Take 20 mg by mouth daily., Disp: , Rfl:    carvedilol (COREG) 6.25 MG tablet, TAKE 1 TABLET BY MOUTH 2 TIMES DAILY WITH A MEAL., Disp: 180 tablet, Rfl: 3   clonazePAM (KLONOPIN) 1 MG tablet, TAKE 1 TABLET BY MOUTH TWICE A DAY *NOT FOR REGULAR USE*, Disp: 30 tablet, Rfl: 0   docusate sodium (COLACE) 100 MG capsule, Take 100 mg by mouth 2 (two) times daily., Disp: , Rfl:    Ensure (ENSURE), Take 237 mLs by mouth daily as needed., Disp: 237 mL, Rfl: 12   ENTRESTO 97-103 MG, TAKE 1 TABLET BY MOUTH TWICE A DAY, Disp: 180 tablet, Rfl: 3   FARXIGA 10 MG TABS tablet, TAKE 1 TABLET BY MOUTH EVERY DAY BEFORE BREAKFAST, Disp: 90 tablet, Rfl: 3   furosemide (LASIX) 20 MG tablet, Take 60 mg by mouth 3 (three)  times a week. Monday and Thursday and Saturday, Disp: , Rfl:    levothyroxine (SYNTHROID) 175 MCG tablet, TAKE 1 TABLET BY MOUTH EVERY DAY BEFORE BREAKFAST, Disp: 90 tablet, Rfl: 1   metolazone (ZAROXOLYN) 2.5 MG tablet, Take 1 tablet as directed., Disp: 1 tablet, Rfl: 0   mexiletine (MEXITIL) 150 MG capsule, TAKE 2 CAPSULES BY MOUTH EVERY MORNING AND 2 CAPSULES BY MOUTH EVERY EVENING., Disp: 360 capsule, Rfl: 3   ranolazine (RANEXA) 500 MG 12 hr tablet, TAKE 1 TABLET BY MOUTH TWICE A DAY **NEED OFFICE VISIT FOR FUTURE REFILLS**, Disp: 180 tablet, Rfl: 1   Salicylic Acid (WART REMOVER EX), Apply 1 application topically daily as needed (foot corns)., Disp: , Rfl:    sertraline (ZOLOFT) 50 MG tablet, TAKE 1 TABLET BY MOUTH EVERY DAY, Disp: 90 tablet, Rfl: 2:  :   Allergies  Allergen Reactions   Oxycodone Other (See Comments)    Gives patient nightmares   Rifampin Nausea Only  :  FH: Mother is 53 years  old in fairly good health.  Father deceased.  Sister with thyroid cancer.  No family history of a blood disorder.  SOCIAL HISTORY: He lives in [place].  He is married.  Originally from [place].  Has 4 healthy children.  He is retired, previously worked as a Actor.  No tobacco use.  He smokes marijuana.  No alcohol use.  Review of Systems: No fever or sweats.  No anorexia or weight loss.  No bleeding.  No rash except dry skin at the forehead.  No pruritus.  Specifically no pruritus after showering.  Legs "achy" at times.  No unusual headaches.  No vision change.  He has occasional nausea.  No change in bowel habits.  Recent cough.  Mild dyspnea on exertion.  No chest pain.  He reports history of a stroke related to endocarditis.  He has never had a DVT.  He is on Lasix 20 mg 3 times a week, takes an additional dose if needed.  His wife feels he is intermittently dehydrated.  Physical Exam:  Blood pressure (!) 145/77, temperature (!) 97.4 F (36.3 C), temperature source Temporal,  resp. rate 18, weight 197 lb (89.4 kg), SpO2 99 %.  HEENT: No thrush or ulcers. Lungs: Lungs clear bilaterally. Cardiac: Regular rate and rhythm. Abdomen: Soft and nontender.  No hepatosplenomegaly. Vascular: No leg edema. Lymph nodes: No palpable cervical, supraclavicular, axillary or inguinal lymph nodes. Neurologic: Alert and oriented. Skin: No rash.  LABS:   Recent Labs    05/16/21 1223  WBC PENDING  HGB 17.3*  HCT 51.1  PLT 153   Peripheral blood smear-platelets appear mildly decreased; moderate number of burr cells, few acanthocytes, few ovalocytes, polychromasia not increased; white blood cell morphology unremarkable.  RADIOLOGY:  No results found.  Assessment and Plan:   Polycythemia Valvular heart disease status post multiple surgeries CHF Hypertension Depression History of thyroid cancer status post thyroidectomy  Bradley [name] has been referred for evaluation of polycythemia.  Dr. Benay Spice discussed potential etiologies.  The polycythemia may be due to a combination of heart failure and diuretic use.  Labs obtained to check for a JAK2 mutation.  We will contact him once this result is available.  He will return for a CBC and follow-up visit in 4 months, sooner if there is a progressive rise in the hemoglobin/hematocrit.  Patient seen with Dr. Benay Spice.    Ned Card, NP 05/16/2021, 1:07 PM   This was a shared visit with Ned Card.  Bradley [name] was interviewed and examined.  I reviewed the peripheral blood smear.  He is referred for evaluation of erythrocytosis.  The erythrocytosis is most likely related to heart disease and diuretic therapy.  The hemoglobin has not changed significantly over the past 6 months.  We have a low clinical suspicion for a myeloproliferative disorder.  We will check for a Jak 2 mutation.  He will return for an office visit and repeat CBC in 4 months.  I was present for greater than 50% of today's visit.  I performed medical  decision making.  Julieanne Manson, MD

## 2021-05-16 ENCOUNTER — Inpatient Hospital Stay: Payer: Medicare Other

## 2021-05-16 ENCOUNTER — Other Ambulatory Visit: Payer: Medicare Other

## 2021-05-16 ENCOUNTER — Inpatient Hospital Stay (HOSPITAL_BASED_OUTPATIENT_CLINIC_OR_DEPARTMENT_OTHER): Payer: Medicare Other | Admitting: Nurse Practitioner

## 2021-05-16 ENCOUNTER — Encounter: Payer: Self-pay | Admitting: Nurse Practitioner

## 2021-05-16 ENCOUNTER — Other Ambulatory Visit: Payer: Self-pay

## 2021-05-16 VITALS — BP 145/77 | Temp 97.4°F | Resp 18 | Wt 197.0 lb

## 2021-05-16 DIAGNOSIS — D45 Polycythemia vera: Secondary | ICD-10-CM

## 2021-05-16 DIAGNOSIS — D751 Secondary polycythemia: Secondary | ICD-10-CM | POA: Diagnosis not present

## 2021-05-16 LAB — CBC WITH DIFFERENTIAL (CANCER CENTER ONLY)
Abs Immature Granulocytes: 0.01 10*3/uL (ref 0.00–0.07)
Basophils Absolute: 0.1 10*3/uL (ref 0.0–0.1)
Basophils Relative: 1 %
Eosinophils Absolute: 0.2 10*3/uL (ref 0.0–0.5)
Eosinophils Relative: 3 %
HCT: 51.1 % (ref 39.0–52.0)
Hemoglobin: 17.3 g/dL — ABNORMAL HIGH (ref 13.0–17.0)
Immature Granulocytes: 0 %
Lymphocytes Relative: 15 %
Lymphs Abs: 1.1 10*3/uL (ref 0.7–4.0)
MCH: 31.6 pg (ref 26.0–34.0)
MCHC: 33.9 g/dL (ref 30.0–36.0)
MCV: 93.2 fL (ref 80.0–100.0)
Monocytes Absolute: 0.8 10*3/uL (ref 0.1–1.0)
Monocytes Relative: 11 %
Neutro Abs: 5.1 10*3/uL (ref 1.7–7.7)
Neutrophils Relative %: 70 %
Platelet Count: 153 10*3/uL (ref 150–400)
RBC: 5.48 MIL/uL (ref 4.22–5.81)
RDW: 13.8 % (ref 11.5–15.5)
WBC Count: 7.3 10*3/uL (ref 4.0–10.5)
nRBC: 0 % (ref 0.0–0.2)

## 2021-05-16 LAB — SAVE SMEAR(SSMR), FOR PROVIDER SLIDE REVIEW

## 2021-05-18 ENCOUNTER — Other Ambulatory Visit: Payer: Self-pay | Admitting: *Deleted

## 2021-05-18 MED ORDER — RANOLAZINE ER 500 MG PO TB12
ORAL_TABLET | ORAL | 3 refills | Status: DC
Start: 2021-05-18 — End: 2022-02-20

## 2021-05-23 ENCOUNTER — Ambulatory Visit (INDEPENDENT_AMBULATORY_CARE_PROVIDER_SITE_OTHER): Payer: Medicare Other

## 2021-05-23 DIAGNOSIS — Z95810 Presence of automatic (implantable) cardiac defibrillator: Secondary | ICD-10-CM

## 2021-05-23 DIAGNOSIS — I5042 Chronic combined systolic (congestive) and diastolic (congestive) heart failure: Secondary | ICD-10-CM | POA: Diagnosis not present

## 2021-05-23 LAB — JAK2 (INCLUDING V617F AND EXON 12), MPL,& CALR-NEXT GEN SEQ

## 2021-05-25 NOTE — Progress Notes (Signed)
EPIC Encounter for ICM Monitoring  Patient Name: Bradley [name] is a 65 y.o. male Date: 05/25/2021 Primary Care Physican: [name], MD Heart Failure Clinic: [facility] Electrophysiologist: Caryl Comes Nephrologist:  [facility] 05/25/2021 Weight: 197 lbs                                                 Spoke with patient and heart failure questions reviewed.  Pt asymptomatic for fluid accumulation and feeling well.   CorVue thoracic impedance suggesting normal fluid levels.   Prescribed:  Furosemide 20 mg Take 60 mg by mouth 3 (three) times a week. Monday and Thursday and Saturday Farxiga 10 mg take 1 tablet by mouth daily    Labs: 02/14/2021 Creatinine 1.26, BUN 20, Potassium 5.2, Sodium 139, GFR >60 10/25/2020 Creatinine 1.27, BUN 24, Potassium 5.2, Sodium 139, GFR >60 10/16/2020 Creatinine 1.45, BUN 22, Potassium 5.2, Sodium 137, GFR 51-59, BNP 4978.0 Care Everywhere A complete set of results can be found in Results Review.   Recommendations:  No changes and encouraged to call if experiencing any fluid symptoms.   Follow-up plan: ICM clinic phone appointment on 07/04/2021.   91 day device clinic remote transmission 06/09/2021.     EP/Cardiology Office Visits:   Recall 08/15/2021 with Dr Haroldine Laws.  Recall 04/27/2022 with Dr Caryl Comes.     Copy of ICM check sent to Dr. Caryl Comes.      3 month ICM trend: 05/23/2021.    1 Year ICM trend:       [name], RN 05/25/2021 8:03 AM

## 2021-05-30 ENCOUNTER — Telehealth: Payer: Self-pay

## 2021-05-30 NOTE — Telephone Encounter (Signed)
-----   Message from Ladell Pier, MD sent at 05/25/2021  7:27 PM EDT ----- Please call patient, molecular testing is negative for a mutation seen in polycythemia vera, follow-up as scheduled

## 2021-05-30 NOTE — Telephone Encounter (Signed)
Information given to Pt's wife who stated she would relay the message to the Pt. Pt's wife verbalized understanding.

## 2021-06-09 ENCOUNTER — Ambulatory Visit (INDEPENDENT_AMBULATORY_CARE_PROVIDER_SITE_OTHER): Payer: Medicare Other

## 2021-06-09 DIAGNOSIS — I428 Other cardiomyopathies: Secondary | ICD-10-CM | POA: Diagnosis not present

## 2021-06-09 LAB — CUP PACEART REMOTE DEVICE CHECK
Battery Remaining Longevity: 38 mo
Battery Remaining Percentage: 37 %
Battery Voltage: 2.86 V
Brady Statistic RV Percent Paced: 1 %
Date Time Interrogation Session: 20221110020027
HighPow Impedance: 71 Ohm
HighPow Impedance: 71 Ohm
Implantable Lead Implant Date: 20160603
Implantable Lead Location: 753860
Implantable Lead Model: 7122
Implantable Pulse Generator Implant Date: 20160603
Lead Channel Impedance Value: 400 Ohm
Lead Channel Pacing Threshold Amplitude: 1.25 V
Lead Channel Pacing Threshold Pulse Width: 0.5 ms
Lead Channel Sensing Intrinsic Amplitude: 11.7 mV
Lead Channel Setting Pacing Amplitude: 2.5 V
Lead Channel Setting Pacing Pulse Width: 0.5 ms
Lead Channel Setting Sensing Sensitivity: 0.5 mV
Pulse Gen Serial Number: 7135169

## 2021-06-12 ENCOUNTER — Other Ambulatory Visit: Payer: Self-pay | Admitting: Family Medicine

## 2021-06-14 DIAGNOSIS — I5042 Chronic combined systolic (congestive) and diastolic (congestive) heart failure: Secondary | ICD-10-CM | POA: Diagnosis not present

## 2021-06-15 ENCOUNTER — Ambulatory Visit (INDEPENDENT_AMBULATORY_CARE_PROVIDER_SITE_OTHER): Payer: Medicare Other | Admitting: Family Medicine

## 2021-06-15 VITALS — BP 130/80 | HR 65 | Temp 98.2°F | Wt 205.5 lb

## 2021-06-15 DIAGNOSIS — E039 Hypothyroidism, unspecified: Secondary | ICD-10-CM

## 2021-06-15 DIAGNOSIS — F333 Major depressive disorder, recurrent, severe with psychotic symptoms: Secondary | ICD-10-CM

## 2021-06-15 DIAGNOSIS — Z23 Encounter for immunization: Secondary | ICD-10-CM | POA: Diagnosis not present

## 2021-06-15 DIAGNOSIS — I428 Other cardiomyopathies: Secondary | ICD-10-CM

## 2021-06-15 NOTE — Progress Notes (Signed)
Established Patient Office Visit  Subjective:  Patient ID: Bradley [name], male    DOB: [birth year]  Age: 65 y.o. MRN: [id number]  CC:  Chief Complaint  Patient presents with   Follow-up    HPI Bradley [name] presents for medical follow-up.  We saw him in May there complaining of excessive fatigue and low motivation.  He seemed very flat and unengaged.  We discussed reducing his Abilify.  At that point he was on 15 mg and they tapered this down to 5 mg and they have seen great improvement.  Sleeping less during the day.  Motivation is slightly improved.  He remains on sertraline 50 mg daily and Klonopin 1 mg twice daily.  Anxiety and depression symptoms currently stable.  He has had some polycythemia.  Recent erythropoietin stable.  He had JAK2 assessment which was normal.  Pending follow-up with oncology.  He is not on any hormone replacement therapy.  No recent chest pains.  Still needs flu vaccine.  He has hypothyroidism.  Last TSH 3/22.  Is due for follow-up lipids soon.  History of nonischemic cardiomyopathy followed by cardiology.  No recent peripheral edema.  No orthopnea.  Past Medical History:  Diagnosis Date   Anxiety    Aortic stenosis    s/p Bentall with bioprosthetic AVR 02/2010; Last echo (9/11): Moderate LVH, EF 45-50%, AVR functioning appropriately, aortic valve mean gradient 21, diastolic dysfunction. Chest MRA (2/13): Mild to moderate dilatation at the sinus of Valsalva at 4.1 cm, mild dilatation ascending aorta distal to the tube graft at 3.9 cm, moderate dilatation of the innominate artery a 2.1 cm;     CHF (congestive heart failure) ([place])    Depression    ESRD on dialysis ([place])    a. 09/2013 felt to be related to gentamycin b. no longer requiring HD   Hx of cardiac cath    a. LHC in 02/2010: normal cors   Hx of echocardiogram 02/2014   Echo (8/15):  Mod LV, EF 35-40%, Gr 1 DD, AVR ok (mean 14 mmHg), mild LAE   Hypertension    Hypothyroidism, postsurgical     Prosthetic valve endocarditis (HCC)    Staphylococcus aureus bacteremia    Thyroid cancer (HCC)    Ventricular tachycardia    a. s/p STJ ICD    Past Surgical History:  Procedure Laterality Date   AORTIC VALVE REPAIR  1968   AORTIC VALVE REPLACEMENT  2011   AV FISTULA PLACEMENT Left 10/08/2013   Procedure: ARTERIOVENOUS (AV) FISTULA CREATION- LEFT ARM; Radial Cephalic ;  Surgeon: Mal [name], MD;  [facility]: [facility];  Service: Vascular;  Laterality: Left;   BENTALL PROCEDURE N/A 10/28/2013   Procedure: REDO BENTALL PROCEDURE, debridment of aoritc root abscess, replacement of aortic root, ascending aorta and aortic valve with homograft. Insertion of left femoral arterial line;  Surgeon: [name], MD;  Location: [place];  Service: Open Heart Surgery;  Laterality: N/A;   BIOPSY  07/22/2019   Procedure: BIOPSY;  Surgeon: Doran Stabler, MD;  Location: [facility];  Service: Gastroenterology;;   CARDIAC CATHETERIZATION  03/02/2010   NORMAL CORONARY ARTERY   CARDIAC CATHETERIZATION N/A 01/01/2015   Procedure: Coronary/Graft Angiography;  Surgeon: Troy Sine, MD;  Location: [facility];  Service: Cardiovascular;  Laterality: N/A;   CARDIAC VALVE REPLACEMENT     COLONOSCOPY WITH PROPOFOL N/A 07/22/2019   Procedure: COLONOSCOPY WITH PROPOFOL;  Surgeon: Doran Stabler, MD;  Location: [facility];  Service: Gastroenterology;  Laterality: N/A;   EP IMPLANTABLE DEVICE N/A 01/01/2015   Procedure: ICD Implant;  Surgeon: Deboraha Sprang, MD;  Location: [facility] [facility];  Service: Cardiovascular;  Laterality: N/A;   HEMOSTASIS CLIP PLACEMENT  07/22/2019   Procedure: HEMOSTASIS CLIP PLACEMENT;  Surgeon: Doran Stabler, MD;  [facility];  Service: Gastroenterology;;   INTRAOPERATIVE TRANSESOPHAGEAL ECHOCARDIOGRAM N/A 10/28/2013   Procedure: INTRAOPERATIVE TRANSESOPHAGEAL ECHOCARDIOGRAM;  Surgeon: [name], MD;  Location: [facility];  Service: Open Heart Surgery;   Laterality: N/A;   PERIPHERAL VASCULAR CATHETERIZATION N/A 01/01/2015   Procedure: Aortic Arch Angiography;  Surgeon: Troy Sine, MD;  Location: [facility];  Service: Cardiovascular;  Laterality: N/A;   POLYPECTOMY  07/22/2019   Procedure: POLYPECTOMY;  Surgeon: Doran Stabler, MD;  [facility];  Service: Gastroenterology;;   SHOULDER ARTHROSCOPY W/ ROTATOR CUFF REPAIR Right 2012   STERNOTOMY     REDO   TEE WITHOUT CARDIOVERSION N/A 10/24/2013   Procedure: TRANSESOPHAGEAL ECHOCARDIOGRAM (TEE);  Surgeon: Dorothy Spark, MD;  Location: [facility];  Service: Cardiovascular;  Laterality: N/A;   THYROIDECTOMY  ~ 2005   TRANSTHORACIC ECHOCARDIOGRAM  03/2010   SHOWED MILD REDUCTION OF LV FUNCTION    Family History  Problem Relation Age of Onset   Hypertension Mother    Heart disease Father    Stroke Father    Hypertension Father    Thyroid cancer Sister    Thyroid cancer Sister    Kidney Stones Sister    Heart attack Neg Hx    Colon cancer Neg Hx    Colon polyps Neg Hx    Esophageal cancer Neg Hx    Stomach cancer Neg Hx    Rectal cancer Neg Hx     Social History   Socioeconomic History   Marital status: Married    Spouse name: Not on file   Number of children: Not on file   Years of education: Not on file   Highest education level: Not on file  Occupational History   Not on file  Tobacco Use   Smoking status: Never   Smokeless tobacco: Never  Vaping Use   Vaping Use: Never used  Substance and Sexual Activity   Alcohol use: No    Alcohol/week: 0.0 standard drinks    Comment: Occasional   Drug use: No   Sexual activity: Never  Other Topics Concern   Not on file  Social History Narrative   Lives at home with walker.  Does not drive.  ESRD T, H, Sa.  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   Social Determinants of Health   Financial Resource Strain: Not on file  Food Insecurity: Not on file  Transportation Needs: Not on file  Physical Activity:  Not on file  Stress: Not on file  Social Connections: Moderately Isolated   Frequency of Communication with Friends and Family: Twice a week   Frequency of Social Gatherings with Friends and Family: More than three times a week   Attends Religious Services: Never   Marine scientist or Organizations: No   Attends Music therapist: Never   Marital Status: Married  Human resources officer Violence: Not on file    Outpatient Medications Prior to Visit  Medication Sig Dispense Refill   acetaminophen (TYLENOL) 325 MG tablet Take 325 mg by mouth 2 (two) times daily as needed for moderate pain or headache.     ARIPiprazole (ABILIFY)  10 MG tablet Take 1 tablet (10 mg total) by mouth daily. (Patient taking differently: Take 5 mg by mouth daily.) 90 tablet 3   aspirin EC 81 MG tablet Take 81 mg by mouth at bedtime.      atorvastatin (LIPITOR) 40 MG tablet Take 20 mg by mouth daily.     carvedilol (COREG) 6.25 MG tablet TAKE 1 TABLET BY MOUTH 2 TIMES DAILY WITH A MEAL. 180 tablet 3   clonazePAM (KLONOPIN) 1 MG tablet TAKE 1 TABLET BY MOUTH TWICE A DAY *NOT FOR REGULAR USE* 30 tablet 0   docusate sodium (COLACE) 100 MG capsule Take 100 mg by mouth 2 (two) times daily.     Ensure (ENSURE) Take 237 mLs by mouth daily as needed. 237 mL 12   ENTRESTO 97-103 MG TAKE 1 TABLET BY MOUTH TWICE A DAY 180 tablet 3   FARXIGA 10 MG TABS tablet TAKE 1 TABLET BY MOUTH EVERY DAY BEFORE BREAKFAST 90 tablet 3   furosemide (LASIX) 20 MG tablet Take 60 mg by mouth 3 (three) times a week. Monday and Thursday and Saturday     levothyroxine (SYNTHROID) 175 MCG tablet TAKE 1 TABLET BY MOUTH EVERY DAY BEFORE BREAKFAST 90 tablet 1   metolazone (ZAROXOLYN) 2.5 MG tablet Take 1 tablet as directed. 1 tablet 0   mexiletine (MEXITIL) 150 MG capsule TAKE 2 CAPSULES BY MOUTH EVERY MORNING AND 2 CAPSULES BY MOUTH EVERY EVENING. 360 capsule 3   ranolazine (RANEXA) 500 MG 12 hr tablet TAKE 1 TABLET BY MOUTH TWICE A DAY 008  tablet 3   Salicylic Acid (WART REMOVER EX) Apply 1 application topically daily as needed (foot corns).     sertraline (ZOLOFT) 50 MG tablet TAKE 1 TABLET BY MOUTH EVERY DAY 90 tablet 2   No facility-administered medications prior to visit.    Allergies  Allergen Reactions   Oxycodone Other (See Comments)    Gives patient nightmares   Rifampin Nausea Only    ROS Review of Systems  Constitutional:  Negative for fatigue and unexpected weight change.  Eyes:  Negative for visual disturbance.  Respiratory:  Negative for cough, chest tightness and shortness of breath.   Cardiovascular:  Negative for chest pain, palpitations and leg swelling.  Endocrine: Negative for polydipsia and polyuria.  Neurological:  Negative for dizziness, syncope, weakness, light-headedness and headaches.     Objective:    Physical Exam Constitutional:      Appearance: He is well-developed.  HENT:     Right Ear: External ear normal.     Left Ear: External ear normal.  Eyes:     Pupils: Pupils are equal, round, and reactive to light.  Neck:     Thyroid: No thyromegaly.  Cardiovascular:     Rate and Rhythm: Normal rate and regular rhythm.  Pulmonary:     Effort: Pulmonary effort is normal. No respiratory distress.     Breath sounds: Normal breath sounds. No wheezing or rales.  Musculoskeletal:     Cervical back: Neck supple.  Neurological:     Mental Status: He is alert and oriented to person, place, and time.    BP 130/80 (BP Location: Left Arm, Patient Position: Sitting, Cuff Size: Normal)   Pulse 65   Temp 98.2 F (36.8 C) (Oral)   Wt 205 lb 8 oz (93.2 kg)   SpO2 99%   BMI 27.11 kg/m  Wt Readings from Last 3 Encounters:  06/15/21 205 lb 8 oz (93.2 kg)  05/16/21 197  lb (89.4 kg)  04/27/21 198 lb 9.6 oz (90.1 kg)     Health Maintenance Due  Topic Date Due   HIV Screening  Never done   Zoster Vaccines- Shingrix (1 of 2) Never done   Pneumococcal Vaccine 76-66 Years old (2 - PPSV23 if  available, else PCV20) 06/09/2015   INFLUENZA VACCINE  02/28/2021    There are no preventive care reminders to display for this patient.  Lab Results  Component Value Date   TSH 4.660 (H) 10/25/2020   Lab Results  Component Value Date   WBC 7.3 05/16/2021   HGB 17.3 (H) 05/16/2021   HCT 51.1 05/16/2021   MCV 93.2 05/16/2021   PLT 153 05/16/2021   Lab Results  Component Value Date   NA 139 02/14/2021   K 4.9 02/14/2021   CO2 27 02/14/2021   GLUCOSE 96 02/14/2021   BUN 20 02/14/2021   CREATININE 1.26 (H) 02/14/2021   BILITOT 0.7 02/14/2021   ALKPHOS 57 02/14/2021   AST 25 02/14/2021   ALT 33 02/14/2021   PROT 7.1 02/14/2021   ALBUMIN 4.0 02/14/2021   CALCIUM 9.8 02/14/2021   ANIONGAP 10 02/14/2021   GFR 66.36 06/11/2019   Lab Results  Component Value Date   CHOL 136 05/31/2020   Lab Results  Component Value Date   HDL 52 05/31/2020   Lab Results  Component Value Date   LDLCALC 68 05/31/2020   Lab Results  Component Value Date   TRIG 78 05/31/2020   Lab Results  Component Value Date   CHOLHDL 2.6 05/31/2020   Lab Results  Component Value Date   HGBA1C 5.9 (H) 12/30/2014      Assessment & Plan:   #1 history of recurrent depression.  He has history of hospitalization 2013 with severe depression and psychotic features.  Currently stable on regimen of sertraline 50 mg daily, Klonopin 1 mg twice daily, and Abilify 5 mg once daily.  Fatigue has improved since reducing dose of Abilify  #2 hyperlipidemia.  History of non--ischemic cardiomyopathy patient would like to wait until spring to get follow-up labs with lipids when he gets his thyroid rechecked  #3 hypothyroidism.  Patient on replacement.  Recheck TSH at follow-up in 3 to 4 months  Flu vaccine given  No orders of the defined types were placed in this encounter.   Follow-up: Return in about 4 months (around 10/13/2021).    [name], MD

## 2021-06-17 NOTE — Progress Notes (Signed)
Remote ICD transmission.   

## 2021-06-30 ENCOUNTER — Other Ambulatory Visit: Payer: Self-pay | Admitting: Internal Medicine

## 2021-06-30 DIAGNOSIS — I10 Essential (primary) hypertension: Secondary | ICD-10-CM

## 2021-06-30 NOTE — Telephone Encounter (Signed)
Pt is a CHF pt, being seen by Dr. Haroldine Laws, he prescribed this medication. Please address

## 2021-07-04 ENCOUNTER — Ambulatory Visit (INDEPENDENT_AMBULATORY_CARE_PROVIDER_SITE_OTHER): Payer: Medicare Other

## 2021-07-04 DIAGNOSIS — I5042 Chronic combined systolic (congestive) and diastolic (congestive) heart failure: Secondary | ICD-10-CM

## 2021-07-04 DIAGNOSIS — Z95810 Presence of automatic (implantable) cardiac defibrillator: Secondary | ICD-10-CM | POA: Diagnosis not present

## 2021-07-06 ENCOUNTER — Telehealth: Payer: Self-pay

## 2021-07-06 NOTE — Progress Notes (Signed)
EPIC Encounter for ICM Monitoring  Patient Name: Bradley [name] is a 65 y.o. male Date: 07/06/2021 Primary Care Physican: [name], MD Heart Failure Clinic: [facility] Electrophysiologist: Caryl Comes Nephrologist:  [place] 05/25/2021 Weight: 197 lbs                                                 Attempted call to patient and unable to reach.  Left detailed message per DPR regarding transmission. Transmission reviewed.    CorVue thoracic impedance normal fluid levels but was suggesting possible fluid accumulation from 11/6-11/19.   Prescribed:  Furosemide 20 mg Take 60 mg by mouth 3 (three) times a week. Monday and Thursday and Saturday Farxiga 10 mg take 1 tablet by mouth daily    Labs: 02/14/2021 Creatinine 1.26, BUN 20, Potassium 5.2, Sodium 139, GFR >60 10/25/2020 Creatinine 1.27, BUN 24, Potassium 5.2, Sodium 139, GFR >60 10/16/2020 Creatinine 1.45, BUN 22, Potassium 5.2, Sodium 137, GFR 51-59, BNP 4978.0 Care Everywhere A complete set of results can be found in Results Review.   Recommendations:  Left voice mail with ICM number and encouraged to call if experiencing any fluid symptoms.   Follow-up plan: ICM clinic phone appointment on 08/08/2021.   91 day device clinic remote transmission 09/08/2021.     EP/Cardiology Office Visits:   Recall 08/15/2021 with Dr Haroldine Laws.  Recall 04/27/2022 with Dr Caryl Comes.     Copy of ICM check sent to Dr. Caryl Comes.     3 month ICM trend: 07/04/2021.    12-14 Month ICM trend:       [name], RN 07/06/2021 3:19 PM

## 2021-07-06 NOTE — Telephone Encounter (Signed)
Remote ICM transmission received.  Attempted call to patient regarding ICM remote transmission and left detailed message per DPR.  Advised to return call for any fluid symptoms or questions. Next ICM remote transmission scheduled 08/08/2021.    

## 2021-07-11 ENCOUNTER — Telehealth: Payer: Self-pay | Admitting: Pharmacist

## 2021-07-11 NOTE — Chronic Care Management (AMB) (Signed)
Chronic Care Management Pharmacy Assistant   Name: Bradley [name]  MRN: [id number] DOB: [birth year]  Reason for Encounter: Disease State / Hypertension Assessment Call   Conditions to be addressed/monitored: HTN  Recent office visits:  06/15/2021 [name] MD - Patient was seen for major depressive disorder and additional issues. No medication changes. Follow up in 4 months.  Recent consult visits:  05/16/2021 Ned Card NP (hematology/oncology) - Patient was seen for polycythemia vera. No medication changes. No follow up noted  Hospital visits:  None  Medications: Outpatient Encounter Medications as of 07/11/2021  Medication Sig   acetaminophen (TYLENOL) 325 MG tablet Take 325 mg by mouth 2 (two) times daily as needed for moderate pain or headache.   ARIPiprazole (ABILIFY) 10 MG tablet Take 1 tablet (10 mg total) by mouth daily. (Patient taking differently: Take 5 mg by mouth daily.)   aspirin EC 81 MG tablet Take 81 mg by mouth at bedtime.    atorvastatin (LIPITOR) 40 MG tablet Take 20 mg by mouth daily.   carvedilol (COREG) 6.25 MG tablet TAKE 1 TABLET BY MOUTH 2 TIMES DAILY WITH A MEAL.   clonazePAM (KLONOPIN) 1 MG tablet TAKE 1 TABLET BY MOUTH TWICE A DAY *NOT FOR REGULAR USE*   docusate sodium (COLACE) 100 MG capsule Take 100 mg by mouth 2 (two) times daily.   Ensure (ENSURE) Take 237 mLs by mouth daily as needed.   ENTRESTO 97-103 MG TAKE 1 TABLET BY MOUTH TWICE A DAY   FARXIGA 10 MG TABS tablet TAKE 1 TABLET BY MOUTH EVERY DAY BEFORE BREAKFAST   furosemide (LASIX) 20 MG tablet TAKE 1 TABLET (20 MG TOTAL) BY MOUTH 4 (FOUR) TIMES A WEEK.   levothyroxine (SYNTHROID) 175 MCG tablet TAKE 1 TABLET BY MOUTH EVERY DAY BEFORE BREAKFAST   metolazone (ZAROXOLYN) 2.5 MG tablet Take 1 tablet as directed.   mexiletine (MEXITIL) 150 MG capsule TAKE 2 CAPSULES BY MOUTH EVERY MORNING AND 2 CAPSULES BY MOUTH EVERY EVENING.   ranolazine (RANEXA) 500 MG 12 hr tablet TAKE 1 TABLET BY  MOUTH TWICE A DAY   Salicylic Acid (WART REMOVER EX) Apply 1 application topically daily as needed (foot corns).   sertraline (ZOLOFT) 50 MG tablet TAKE 1 TABLET BY MOUTH EVERY DAY   No facility-administered encounter medications on file as of 07/11/2021.  Fill History: ARIPIPRAZOLE 10 MG TABLET 05/26/2021 90   ATORVASTATIN CALCIUM  40 MG TABS 10/28/2020 90   CARVEDILOL 6.25 MG TABLET 04/03/2021 90   FARXIGA 10 MG TABLET 04/28/2021 90   FUROSEMIDE 20 MG TABLET 04/09/2021 84   LEVOTHYROXINE 175 MCG TABLET 03/17/2021 90   MEXILETINE 150 MG CAPSULE 06/18/2021 90   RANOLAZINE ER 500 MG TABLET 05/18/2021 90   ENTRESTO 97 MG-103 MG TABLET 05/05/2021 90   SERTRALINE HCL 50 MG TABLET 05/26/2021 90   Reviewed chart prior to disease state call. Spoke with patient regarding BP  Recent Office Vitals: BP Readings from Last 3 Encounters:  06/15/21 130/80  05/16/21 (!) 145/77  04/27/21 114/68   Pulse Readings from Last 3 Encounters:  06/15/21 65  04/27/21 64  02/14/21 64    Wt Readings from Last 3 Encounters:  06/15/21 205 lb 8 oz (93.2 kg)  05/16/21 197 lb (89.4 kg)  04/27/21 198 lb 9.6 oz (90.1 kg)     Kidney Function Lab Results  Component Value Date/Time   CREATININE 1.26 (H) 02/14/2021 01:08 PM   CREATININE 1.27 (H) 10/25/2020 12:37 PM  CREATININE 1.28 10/14/2015 12:28 PM   CREATININE 1.21 07/12/2015 12:07 PM   GFR 66.36 06/11/2019 10:40 AM   GFRNONAA >60 02/14/2021 01:08 PM   GFRAA 74 04/13/2020 04:09 PM    BMP Latest Ref Rng & Units 02/14/2021 10/25/2020 04/13/2020  Glucose 70 - 99 mg/dL 96 97 70  BUN 8 - 23 mg/dL 20 24(H) 21  Creatinine 0.61 - 1.24 mg/dL 1.26(H) 1.27(H) 1.20  BUN/Creat Ratio 10 - 24 - - 18  Sodium 135 - 145 mmol/L 139 139 143  Potassium 3.5 - 5.1 mmol/L 4.9 5.2(H) 5.1  Chloride 98 - 111 mmol/L 102 103 105  CO2 22 - 32 mmol/L 27 30 27   Calcium 8.9 - 10.3 mg/dL 9.8 10.0 9.8    Current antihypertensive regimen:  Carvedilol 6.25 mg twice  daily  How often are you checking your Blood Pressure?   Current home BP readings:   What recent interventions/DTPs have been made by any provider to improve Blood Pressure control since last CPP Visit:   Any recent hospitalizations or ED visits since last visit with CPP?   What diet changes have been made to improve Blood Pressure Control?  Patient will have a waffle or banana bread for breakfast and a meat, vegetable and a starch for dinner.  Patient does like sweets.  What exercise is being done to improve your Blood Pressure Control?    Adherence Review: Is the patient currently on ACE/ARB medication?  Does the patient have >5 day gap between last estimated fill dates?   Unable to reach patient after several attemtsl  Care Gaps: AWV - scheduled for 10/24/21 Last BP - 130/80 on 06/15/2021 HIV screening - never done Zoster vaccines - never done Pneumovax - overdue  Star Rating Drugs: Atorvastatin 40mg  - last filled on 10/28/20 [facility] at [facility] 10[facility]  - last filled on 04/28/21 90DS at [facility] [phone]

## 2021-07-13 DIAGNOSIS — I5042 Chronic combined systolic (congestive) and diastolic (congestive) heart failure: Secondary | ICD-10-CM | POA: Diagnosis not present

## 2021-08-08 ENCOUNTER — Ambulatory Visit (INDEPENDENT_AMBULATORY_CARE_PROVIDER_SITE_OTHER): Payer: Commercial Managed Care - HMO

## 2021-08-08 DIAGNOSIS — Z95810 Presence of automatic (implantable) cardiac defibrillator: Secondary | ICD-10-CM | POA: Diagnosis not present

## 2021-08-08 DIAGNOSIS — I5042 Chronic combined systolic (congestive) and diastolic (congestive) heart failure: Secondary | ICD-10-CM

## 2021-08-10 ENCOUNTER — Telehealth: Payer: Self-pay

## 2021-08-10 NOTE — Telephone Encounter (Signed)
Remote ICM transmission received.  Attempted call to patient regarding ICM remote transmission and left detailed message per DPR.  Advised to return call for any fluid symptoms or questions. Next ICM remote transmission scheduled 09/12/2021.

## 2021-08-10 NOTE — Progress Notes (Signed)
EPIC Encounter for ICM Monitoring  Patient Name: Bradley [name] is a 66 y.o. male Date: 08/10/2021 Primary Care Physican: [name], MD Heart Failure Clinic: [facility] Electrophysiologist: Caryl Comes Nephrologist:  [place] 05/25/2021 Weight: 197 lbs                                                 Attempted call to patient and unable to reach.  Left detailed message per DPR regarding transmission. Transmission reviewed.    CorVue thoracic impedance normal fluid levels but was suggesting possible fluid accumulation from 12/27-1/4.   Prescribed:  Furosemide 20 mg Take 60 mg by mouth 3 (three) times a week. Monday and Thursday and Saturday Farxiga 10 mg take 1 tablet by mouth daily    Labs: 02/14/2021 Creatinine 1.26, BUN 20, Potassium 5.2, Sodium 139, GFR >60 10/25/2020 Creatinine 1.27, BUN 24, Potassium 5.2, Sodium 139, GFR >60 10/16/2020 Creatinine 1.45, BUN 22, Potassium 5.2, Sodium 137, GFR 51-59, BNP 4978.0 Care Everywhere A complete set of results can be found in Results Review.   Recommendations:  Left voice mail with ICM number and encouraged to call if experiencing any fluid symptoms.   Follow-up plan: ICM clinic phone appointment on 09/12/2021.   91 day device clinic remote transmission 09/08/2021.     EP/Cardiology Office Visits:   Recall 08/15/2021 with Dr Haroldine Laws.  Recall 04/27/2022 with Dr Caryl Comes.     Copy of ICM check sent to Dr. Caryl Comes.    3 month ICM trend: 08/08/2021.    12-14 Month ICM trend:     [name], RN 08/10/2021 3:47 PM

## 2021-08-15 DIAGNOSIS — I5042 Chronic combined systolic (congestive) and diastolic (congestive) heart failure: Secondary | ICD-10-CM | POA: Diagnosis not present

## 2021-09-08 ENCOUNTER — Ambulatory Visit (INDEPENDENT_AMBULATORY_CARE_PROVIDER_SITE_OTHER): Payer: Medicare Other

## 2021-09-08 DIAGNOSIS — I428 Other cardiomyopathies: Secondary | ICD-10-CM

## 2021-09-08 LAB — CUP PACEART REMOTE DEVICE CHECK
Battery Remaining Longevity: 34 mo
Battery Remaining Percentage: 33 %
Battery Voltage: 2.84 V
Brady Statistic RV Percent Paced: 1 %
Date Time Interrogation Session: 20230209020016
HighPow Impedance: 75 Ohm
HighPow Impedance: 75 Ohm
Implantable Lead Implant Date: 20160603
Implantable Lead Location: 753860
Implantable Lead Model: 7122
Implantable Pulse Generator Implant Date: 20160603
Lead Channel Impedance Value: 400 Ohm
Lead Channel Pacing Threshold Amplitude: 1.25 V
Lead Channel Pacing Threshold Pulse Width: 0.5 ms
Lead Channel Sensing Intrinsic Amplitude: 11.7 mV
Lead Channel Setting Pacing Amplitude: 2.5 V
Lead Channel Setting Pacing Pulse Width: 0.5 ms
Lead Channel Setting Sensing Sensitivity: 0.5 mV
Pulse Gen Serial Number: 7135169

## 2021-09-12 ENCOUNTER — Ambulatory Visit (INDEPENDENT_AMBULATORY_CARE_PROVIDER_SITE_OTHER): Payer: Medicare Other

## 2021-09-12 DIAGNOSIS — I5042 Chronic combined systolic (congestive) and diastolic (congestive) heart failure: Secondary | ICD-10-CM | POA: Diagnosis not present

## 2021-09-12 DIAGNOSIS — Z95810 Presence of automatic (implantable) cardiac defibrillator: Secondary | ICD-10-CM

## 2021-09-13 NOTE — Progress Notes (Signed)
Remote ICD transmission.   

## 2021-09-14 DIAGNOSIS — I5042 Chronic combined systolic (congestive) and diastolic (congestive) heart failure: Secondary | ICD-10-CM | POA: Diagnosis not present

## 2021-09-14 NOTE — Progress Notes (Signed)
EPIC Encounter for ICM Monitoring  Patient Name: Bradley [name] is a 66 y.o. male Date: 09/14/2021 Primary Care Physican: [name], MD Heart Failure Clinic: [place] [facility] Electrophysiologist: Caryl Comes Nephrologist:  [place] 05/25/2021 Weight: 197 lbs                                                 Transmission reviewed.    CorVue thoracic impedance normal fluid levels.   Prescribed:  Furosemide 20 mg Take 60 mg by mouth 3 (three) times a week. Monday and Thursday and Saturday Farxiga 10 mg take 1 tablet by mouth daily    Labs: 02/14/2021 Creatinine 1.26, BUN 20, Potassium 5.2, Sodium 139, GFR >60 10/25/2020 Creatinine 1.27, BUN 24, Potassium 5.2, Sodium 139, GFR >60 10/16/2020 Creatinine 1.45, BUN 22, Potassium 5.2, Sodium 137, GFR 51-59, BNP 4978.0 Care Everywhere A complete set of results can be found in Results Review.   Recommendations:  No changes   Follow-up plan: ICM clinic phone appointment on 10/17/2021.   91 day device clinic remote transmission 12/08/2021.     EP/Cardiology Office Visits:   Recall 08/15/2021 with Dr Haroldine Laws.  Recall 04/27/2022 with Dr Caryl Comes.     Copy of ICM check sent to Dr. Caryl Comes.    3 month ICM trend: 09/12/2021.    12-14 Month ICM trend:     [name], RN 09/14/2021 9:05 AM

## 2021-09-15 ENCOUNTER — Telehealth (HOSPITAL_COMMUNITY): Payer: Self-pay

## 2021-09-15 ENCOUNTER — Telehealth: Payer: Self-pay | Admitting: Pharmacist

## 2021-09-15 NOTE — Chronic Care Management (AMB) (Signed)
    Chronic Care Management Pharmacy Assistant   Name: Bradley [name]  MRN: [id number] DOB: March 26, 1956  09/19/2021 APPOINTMENT REMINDER  Bradley [name] was reminded to have all medications, supplements and any blood glucose and blood pressure readings available for review with Jeni Salles, Pharm. D, at his telephone visit on 09/19/2021 at 3:00.   Questions: Have you had any recent office visit or specialist visit outside of [place]? Patient has an upcoming appointment 09/16/21 with hematology/oncology.   Are there any concerns you would like to discuss during your office visit? Patient denies any concerns at this time  Are you having any problems obtaining your medications? (Whether it pharmacy issues or cost) Patient is not having any issues obtaining medications.   If patient has any PAP medications ask if they are having any problems getting their PAP medication or refill? Patient denies any medications coming from PAP  Care Gaps: AWV - scheduled 10/24/2021 Last BP - 130/80 on 06/15/2021 HIV screening - never done Zoster vaccines - never done  Star Rating Drug: Atorvastatin 40mg  - [facility] Anderson Malta the [facility]ist.  [place] 10mg  - [facility]  Any gaps in medications fill history? Yes  [place] Pharmacist Assistant [phone]

## 2021-09-15 NOTE — Telephone Encounter (Signed)
Pt spouse called as she was concerned because he has a BP of 154/95 with a HR of 57. BP is normally of the range 130-140's. Advised her to continue to monitor and if it stays in the 150 range to call us back.

## 2021-09-16 ENCOUNTER — Inpatient Hospital Stay (HOSPITAL_BASED_OUTPATIENT_CLINIC_OR_DEPARTMENT_OTHER): Payer: Medicare Other | Admitting: Oncology

## 2021-09-16 ENCOUNTER — Inpatient Hospital Stay: Payer: Medicare Other | Attending: Oncology

## 2021-09-16 ENCOUNTER — Other Ambulatory Visit: Payer: Self-pay

## 2021-09-16 VITALS — BP 143/73 | HR 62 | Temp 97.8°F | Resp 18 | Ht 73.0 in | Wt 198.5 lb

## 2021-09-16 DIAGNOSIS — D751 Secondary polycythemia: Secondary | ICD-10-CM | POA: Diagnosis not present

## 2021-09-16 DIAGNOSIS — D45 Polycythemia vera: Secondary | ICD-10-CM

## 2021-09-16 DIAGNOSIS — I110 Hypertensive heart disease with heart failure: Secondary | ICD-10-CM | POA: Insufficient documentation

## 2021-09-16 DIAGNOSIS — I509 Heart failure, unspecified: Secondary | ICD-10-CM | POA: Insufficient documentation

## 2021-09-16 DIAGNOSIS — Z85850 Personal history of malignant neoplasm of thyroid: Secondary | ICD-10-CM | POA: Insufficient documentation

## 2021-09-16 DIAGNOSIS — I38 Endocarditis, valve unspecified: Secondary | ICD-10-CM | POA: Insufficient documentation

## 2021-09-16 LAB — CBC WITH DIFFERENTIAL (CANCER CENTER ONLY)
Abs Immature Granulocytes: 0.02 10*3/uL (ref 0.00–0.07)
Basophils Absolute: 0.1 10*3/uL (ref 0.0–0.1)
Basophils Relative: 1 %
Eosinophils Absolute: 0.1 10*3/uL (ref 0.0–0.5)
Eosinophils Relative: 2 %
HCT: 52.6 % — ABNORMAL HIGH (ref 39.0–52.0)
Hemoglobin: 17.2 g/dL — ABNORMAL HIGH (ref 13.0–17.0)
Immature Granulocytes: 0 %
Lymphocytes Relative: 16 %
Lymphs Abs: 1.1 10*3/uL (ref 0.7–4.0)
MCH: 30.9 pg (ref 26.0–34.0)
MCHC: 32.7 g/dL (ref 30.0–36.0)
MCV: 94.6 fL (ref 80.0–100.0)
Monocytes Absolute: 0.6 10*3/uL (ref 0.1–1.0)
Monocytes Relative: 9 %
Neutro Abs: 4.9 10*3/uL (ref 1.7–7.7)
Neutrophils Relative %: 72 %
Platelet Count: 150 10*3/uL (ref 150–400)
RBC: 5.56 MIL/uL (ref 4.22–5.81)
RDW: 14 % (ref 11.5–15.5)
WBC Count: 6.9 10*3/uL (ref 4.0–10.5)
nRBC: 0 % (ref 0.0–0.2)

## 2021-09-16 NOTE — Progress Notes (Signed)
  Bradley [name] OFFICE PROGRESS NOTE   Diagnosis: Erythrocytosis  INTERVAL HISTORY:   Bradley [name] returns for scheduled visit.  We saw him in October for evaluation of erythrocytosis.  A myeloproliferative panel returned negative.  The erythropoietin level was low.  Bradley [name] has chronic exertional dyspnea.  He has intermittent nausea.  No venous or arterial thrombosis.  Objective:  Vital signs in last 24 hours:  Blood pressure (!) 143/73, pulse 62, temperature 97.8 F (36.6 C), temperature source Oral, resp. rate 18, height 6\' 1"  (1.854 m), weight 198 lb 8 oz (90 kg), SpO2 99 %.  Resp: Lungs clear bilaterally Cardio: Regular rate and rhythm GI: No hepatosplenomegaly Vascular: No leg edema    Lab Results:  Lab Results  Component Value Date   WBC 6.9 09/16/2021   HGB 17.2 (H) 09/16/2021   HCT 52.6 (H) 09/16/2021   MCV 94.6 09/16/2021   PLT 150 09/16/2021   NEUTROABS 4.9 09/16/2021    CMP  Lab Results  Component Value Date   NA 139 02/14/2021   K 4.9 02/14/2021   CL 102 02/14/2021   CO2 27 02/14/2021   GLUCOSE 96 02/14/2021   BUN 20 02/14/2021   CREATININE 1.26 (H) 02/14/2021   CALCIUM 9.8 02/14/2021   PROT 7.1 02/14/2021   ALBUMIN 4.0 02/14/2021   AST 25 02/14/2021   ALT 33 02/14/2021   ALKPHOS 57 02/14/2021   BILITOT 0.7 02/14/2021   GFRNONAA >60 02/14/2021   GFRAA 74 04/13/2020     Medications: I have reviewed the patient's current medications.   Assessment/Plan:   Polycythemia Valvular heart disease status post multiple surgeries CHF Hypertension Depression History of thyroid cancer status post thyroidectomy   Disposition: Bradley [name] was referred for evaluation of erythrocytosis.  I have a low clinical suspicion for a myeloproliferative disorder.  The JAK2 mutation testing was negative.  He most likely has a secondary erythrocytosis due to heart disease.  There may be a partial pseudo erythrocytosis secondary to diuretic  therapy.  The hemoglobin has not changed over the past year.  There is no indication for phlebotomy therapy.  I discharged him from the hematology clinic.  He will continue clinical follow-up with cardiology and Dr. Elease Hashimoto.  I am glad to see him again if he develops a rise in the hemoglobin or develops another hematologic abnormality.  Betsy Coder, MD  09/16/2021  11:39 AM

## 2021-09-19 ENCOUNTER — Ambulatory Visit (INDEPENDENT_AMBULATORY_CARE_PROVIDER_SITE_OTHER): Payer: Medicare Other | Admitting: Pharmacist

## 2021-09-19 DIAGNOSIS — E039 Hypothyroidism, unspecified: Secondary | ICD-10-CM

## 2021-09-19 DIAGNOSIS — I10 Essential (primary) hypertension: Secondary | ICD-10-CM

## 2021-09-19 NOTE — Progress Notes (Signed)
Chronic Care Management Pharmacy Note  09/27/2021 Name:  Bradley [name] MRN:  [id number] DOB:  [birth year]  Summary: Per pharmacy report, pt is non adherent to atorvastatin BP not at goal < 130/80 per office readings  Recommendations/Changes made from today's visit: -Recommend repeat lipid panel and consider increasing atorvastatin back to 1 full tablet  Plan: Scheduled CPE BP assessment in 2-3 months Follow up in 6 months  Subjective: Bradley [name] is an 66 y.o. year old male who is a primary patient of Burchette, Alinda Sierras, MD.  The CCM team was consulted for assistance with disease management and care coordination needs.    Engaged with patient face to face for follow up visit in response to provider referral for pharmacy case management and/or care coordination services.   Consent to Services:  The patient was given information about Chronic Care Management services, agreed to services, and gave verbal consent prior to initiation of services.  Please see initial visit note for detailed documentation.   Patient Care Team: [name], MD as PCP - Desma Paganini, MD as PCP - Cardiology (Cardiology) Deboraha Sprang, MD as PCP - Electrophysiology (Cardiology) Bensimhon, Shaune Pascal, MD as PCP - Advanced Heart Failure (Cardiology) [name], MD (Inactive) as Consulting Physician (Cardiothoracic Surgery) Deterding, Jeneen Rinks, MD as Consulting Physician (Nephrology) Deboraha Sprang, MD as Consulting Physician (Cardiology) Viona Gilmore, [facility] as Pharmacist (Pharmacist)  Recent office visits: 06/15/2021 [name] MD - Patient was seen for major depressive disorder and additional issues. No medication changes. Follow up in 4 months.  Recent consult visits: 09/16/21 Betsy Coder, MD (oncology): Patient presented for erythrocytosis. Follow up PRN.  05/16/2021 Ned Card NP (hematology/oncology) - Patient was seen for polycythemia vera. No medication  changes. No follow up noted.  02/14/21 Glori Bickers, MD (cardiology): Patient presented for CHF follow up.   Hospital visits: Medication Reconciliation was completed by comparing discharge summary, patient's EMR and Pharmacy list, and upon discussion with patient.   Patient visited [facility] Emergency Department on 10/16/20 for vomiting, syncope, dehydration and heart failure. Patient was recommended to be admitted but left on his own will.    New?Medications Started at [facility] Discharge:?? -started None.    Medication Changes at Hospital Discharge: -Changed None.    Medications Discontinued at Hospital Discharge: -Stopped None.    Medications that remain the same after Hospital Discharge:??  -All other medications will remain the same.    Objective:  Lab Results  Component Value Date   CREATININE 1.26 (H) 02/14/2021   BUN 20 02/14/2021   GFR 66.36 06/11/2019   GFRNONAA >60 02/14/2021   GFRAA 74 04/13/2020   NA 139 02/14/2021   K 4.9 02/14/2021   CALCIUM 9.8 02/14/2021   CO2 27 02/14/2021   GLUCOSE 96 02/14/2021    Lab Results  Component Value Date/Time   HGBA1C 5.9 (H) 12/30/2014 04:52 AM   HGBA1C 4.9 10/27/2013 06:33 PM   GFR 66.36 06/11/2019 10:40 AM   GFR 73.80 05/29/2018 03:13 PM    Last diabetic Eye exam: No results found for: HMDIABEYEEXA  Last diabetic Foot exam: No results found for: HMDIABFOOTEX   Lab Results  Component Value Date   CHOL 136 05/31/2020   HDL 52 05/31/2020   LDLCALC 68 05/31/2020   TRIG 78 05/31/2020   CHOLHDL 2.6 05/31/2020    Hepatic Function Latest Ref Rng & Units 02/14/2021 10/25/2020 05/31/2020  Total Protein 6.5 - 8.1 g/dL 7.1 6.7 6.6  Albumin 3.5 - 5.0 g/dL 4.0 3.8 -  AST 15 - 41 U/L 25 34 19  ALT 0 - 44 U/L 33 53(H) 18  Alk Phosphatase 38 - 126 U/L 57 57 -  Total Bilirubin 0.3 - 1.2 mg/dL 0.7 0.8 0.7  Bilirubin, Direct 0.0 - 0.2 mg/dL - - 0.2    Lab Results  Component Value Date/Time   TSH 4.660 (H) 10/25/2020  12:27 PM   TSH 0.43 05/31/2020 01:59 PM   TSH 1.75 06/11/2019 10:40 AM   FREET4 1.00 10/25/2020 12:37 PM   FREET4 0.98 12/31/2014 08:55 AM    CBC Latest Ref Rng & Units 09/16/2021 05/16/2021 04/27/2021  WBC 4.0 - 10.5 K/uL 6.9 7.3 8.4  Hemoglobin 13.0 - 17.0 g/dL 17.2(H) 17.3(H) 16.9  Hematocrit 39.0 - 52.0 % 52.6(H) 51.1 51.3(H)  Platelets 150 - 400 K/uL 150 153 166    No results found for: VD25OH  Clinical ASCVD: Yes  The ASCVD Risk score (Arnett DK, et al., 2019) failed to calculate for the following reasons:   The patient has a prior MI or stroke diagnosis    Depression screen [facility] 2/9 10/22/2020 02/13/2020 05/29/2018  Decreased Interest 2 0 0  Down, Depressed, Hopeless '1 1 1  ' PHQ - 2 Score '3 1 1  ' Altered sleeping 3 3 -  Tired, decreased energy 1 2 -  Change in appetite 1 0 -  Feeling bad or failure about yourself  1 1 -  Trouble concentrating 0 3 -  Moving slowly or fidgety/restless 1 2 -  Suicidal thoughts 0 0 -  PHQ-9 Score 10 12 -  Difficult doing work/chores Somewhat difficult - -  Some recent data might be hidden      Social History   Tobacco Use  Smoking Status Never  Smokeless Tobacco Never   BP Readings from Last 3 Encounters:  09/16/21 (!) 143/73  06/15/21 130/80  05/16/21 (!) 145/77   Pulse Readings from Last 3 Encounters:  09/16/21 62  06/15/21 65  04/27/21 64   Wt Readings from Last 3 Encounters:  09/16/21 198 lb 8 oz (90 kg)  06/15/21 205 lb 8 oz (93.2 kg)  05/16/21 197 lb (89.4 kg)   BMI Readings from Last 3 Encounters:  09/16/21 26.19 kg/m  06/15/21 27.11 kg/m  05/16/21 25.99 kg/m    Assessment/Interventions: Review of patient past medical history, allergies, medications, health status, including review of consultants reports, laboratory and other test data, was performed as part of comprehensive evaluation and provision of chronic care management services.   SDOH:  (Social Determinants of Health) assessments and interventions  performed: No  SDOH Screenings   Alcohol Screen: Low Risk    Last Alcohol Screening Score (AUDIT): 0  Depression (PHQ2-9): Medium Risk   PHQ-2 Score: 10  Financial Resource Strain: Not on file  Food Insecurity: Not on file  Housing: Not on file  Physical Activity: Not on file  Social Connections: Moderately Isolated   Frequency of Communication with Friends and Family: Twice a week   Frequency of Social Gatherings with Friends and Family: More than three times a week   Attends Religious Services: Never   Marine scientist or Organizations: No   Attends Music therapist: Never   Marital Status: Married  Stress: Not on file  Tobacco Use: Low Risk    Smoking Tobacco Use: Never   Smokeless Tobacco Use: Never   Passive Exposure: Not on file  Transportation Needs: Not on file  CCM Care Plan  Allergies  Allergen Reactions   Oxycodone Other (See Comments)    Gives patient nightmares   Rifampin Nausea Only    Medications Reviewed Today     Reviewed by Kelli Hope, LPN (Licensed Practical Nurse) on 09/16/21 at 1120  Med List Status: <None>   Medication Order Taking? Sig Documenting Provider Last Dose Status Informant  acetaminophen (TYLENOL) 325 MG tablet [id number]  Take 325 mg by mouth 2 (two) times daily as needed for moderate pain or headache. [provider]  Active Spouse/Significant Other  ARIPiprazole (ABILIFY) 10 MG tablet [id number]  Take 1 tablet (10 mg total) by mouth daily.  Patient taking differently: Take 5 mg by mouth daily.   [name], MD  Active   aspirin EC 81 MG tablet [id number]  Take 81 mg by mouth at bedtime.  [provider]  Active Spouse/Significant Other  atorvastatin (LIPITOR) 40 MG tablet [id number]  Take 20 mg by mouth daily. [provider]  Active   carvedilol (COREG) 6.25 MG tablet [id number]  TAKE 1 TABLET BY MOUTH 2 TIMES DAILY WITH A MEAL. Bensimhon, Shaune Pascal, MD  Active   clonazePAM  (KLONOPIN) 1 MG tablet [id number]  TAKE 1 TABLET BY MOUTH TWICE A DAY *NOT FOR REGULAR USE* Burchette, Alinda Sierras, MD  Active   docusate sodium (COLACE) 100 MG capsule [id number]  Take 100 mg by mouth 2 (two) times daily. [provider]  Active Spouse/Significant Other  Ensure (ENSURE) [id number]  Take 237 mLs by mouth daily as needed. [name], MD  Active   ENTRESTO 97-103 MG [id number]  TAKE 1 TABLET BY MOUTH TWICE A DAY Bensimhon, Shaune Pascal, MD  Active   FARXIGA 10 MG TABS tablet [id number]  TAKE 1 TABLET BY MOUTH EVERY DAY BEFORE BREAKFAST Bensimhon, Shaune Pascal, MD  Active   furosemide (LASIX) 20 MG tablet [id number]  TAKE 1 TABLET (20 MG TOTAL) BY MOUTH 4 (FOUR) TIMES A WEEK. Bensimhon, Shaune Pascal, MD  Active   levothyroxine (SYNTHROID) 175 MCG tablet [id number]  TAKE 1 TABLET BY MOUTH EVERY DAY BEFORE BREAKFAST Burchette, Alinda Sierras, MD  Active   metolazone (ZAROXOLYN) 2.5 MG tablet [id number]  Take 1 tablet as directed. Bensimhon, Shaune Pascal, MD  Active   mexiletine (MEXITIL) 150 MG capsule [id number]  TAKE 2 CAPSULES BY MOUTH EVERY MORNING AND 2 CAPSULES BY MOUTH EVERY EVENING. Bensimhon, Shaune Pascal, MD  Active   ranolazine (RANEXA) 500 MG 12 hr tablet [id number]  TAKE 1 TABLET BY MOUTH TWICE A DAY Deboraha Sprang, MD  Active   Salicylic Acid (WART REMOVER EX) [id number]  Apply 1 application topically daily as needed (foot corns). 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    CKD (chronic kidney disease), stage II    H/O aortic valve repair- 1968 12/07/2013   Non-ischemic cardiomyopathy- EF 20-25% - TTE 11/2013 12/07/2013   Ventricular tachycardia    Elevated troponin-  chronic- normal coronaries 11/25/2013   Chronic combined systolic and diastolic CHF (congestive heart failure) ([place]) 10/22/2013   Major depressive disorder, recurrent, severe with psychotic features- hospitalized in  Jan 2013 08/20/2011   Generalized anxiety disorder 08/20/2011   AVR- Deneen Harts 2011, re-do March 2015 secondary to valve endocarditis 12/29/2010   Hypertension  12/29/2010   Hypothyroidism 03/05/2009    Immunization History  Administered Date(s) Administered   Fluad Quad(high Dose 65+) 06/15/2021   Influenza Split 08/18/2011   Influenza, Quadrivalent, Recombinant, Inj, Pf 05/04/2019   Influenza,inj,Quad PF,6+ Mos 09/11/2013, 03/19/2014, 03/21/2017, 05/29/2018, 05/31/2020   PFIZER(Purple Top)SARS-COV-2 Vaccination 09/29/2019, 10/20/2019, 07/02/2020   Pfizer Covid-19 Vaccine Bivalent Booster 56yr & up 04/11/2021   Pneumococcal Conjugate-13 06/08/2014   Td 07/31/2004   Tdap 03/06/2016   Patient's wife reports that he is taking all of his medications as prescribed except for atorvastatin as he is taking 1/2 tablet.   Conditions to be addressed/monitored:  Hypertension, Hyperlipidemia, Heart Failure, Hypothyroidism, Depression, and Anxiety  Conditions addressed this visit: Hypertension, heart failure, hyperlipidemia  Care Plan : CCM Pharmacy Care Plan  Updates made by PViona Gilmore RGliddensince 09/27/2021 12:00 AM     Problem: Problem: Hypertension, Hyperlipidemia, Heart Failure, Hypothyroidism, Depression, and Anxiety      Long-Range Goal: Patient-Specific Goal   Start Date: 03/15/2021  Expected End Date: 03/15/2022  Recent Progress: On track  Priority: High  Note:   Current Barriers:  Unable to independently monitor therapeutic efficacy Unable to maintain control of hypothyroidism  Pharmacist Clinical Goal(s):  Patient will achieve adherence to monitoring guidelines and medication adherence to achieve therapeutic efficacy through collaboration with PharmD and  provider.   Interventions: 1:1 collaboration with BEulas Post MD regarding development and update of comprehensive plan of care as evidenced by provider attestation and co-signature Inter-disciplinary care team collaboration (see longitudinal plan of care) Comprehensive medication review performed; medication list updated in electronic medical record  Hypertension (BP goal <130/80) -Controlled -Current treatment: Carvedilol 6.25 mg 1 tablet twice daily - Appropriate, Query effective, Safe, Accessible -Medications previously tried: unknown  -Current home readings: 2/18 142/97, 2/20 117/82 (checking every day) -Current dietary habits: limits salt some but does not read package labels; does look at package labels - salt intake is minimal and he does eat out often (twice a week) - BP doesn't good; does do some fresh, frozen and canned vegetables; potato chips (low sodium); no frozen meals -Current exercise habits: did not discuss -Denies hypotensive/hypertensive symptoms -Educated on BP goals and benefits of medications for prevention of heart attack, stroke and kidney damage; Exercise goal of 150 minutes per week; Importance of home blood pressure monitoring; Proper BP monitoring technique; -Counseled to monitor BP at home weekly, document, and provide log at future appointments -Counseled on diet and exercise extensively Recommended to continue current medication  Hyperlipidemia: (LDL goal < 70) -Controlled -Current treatment: Atorvastatin 40 mg 1 tablet daily - taking 1/2 tablet Appropriate, Query effective, Safe, Accessible -Medications previously tried: none  -Current dietary patterns: did not discuss -Current exercise habits: did not discuss -Educated on Cholesterol goals;  Importance of limiting foods high in cholesterol; Exercise goal of 150 minutes per week; -Counseled on diet and exercise extensively Recommended to continue current medication  Heart Failure (Goal:  manage symptoms and prevent exacerbations) -Controlled -Last ejection fraction: 35-40% (Date: 02/14/21) -HF type: Systolic -NYHA Class: II (slight limitation of activity) -AHA HF Stage: C (Heart disease and symptoms present) -Current treatment: Carvedilol 6.25 mg 1 tablet twice daily - Appropriate, Effective, Safe, Accessible Furosemide 20 mg 3 tablets three times a week - Appropriate, Effective, Safe, Accessible Entresto 97-103 mg 1 tablet twice daily - Appropriate,  Effective, Safe, Accessible Metolazone 2.5 mg as directed - Appropriate, Effective, Safe, Accessible -Medications previously tried: unknown  -Current home BP/HR readings: patient could not provide -Current dietary habits: limits salt some but does not read package labels -Current exercise habits: did not discuss -Educated on Benefits of medications for managing symptoms and prolonging life Proper diuretic administration and potassium supplementation Importance of blood pressure control -Counseled on diet and exercise extensively Recommended to continue current medication  Chest pain/arrhythmia (Goal: minimize symptoms) -Controlled -Current treatment  Ranolazine  500 mg 1 tablet twice daily - Appropriate, Effective, Safe, Accessible Mexiletine 150 mg 2 capsules in the morning and 2 capsules in the evening - Appropriate, Effective, Safe, Accessible -Medications previously tried: none  -Recommended to continue current medication   Depression/Anxiety (Goal: minimize symptoms) -Controlled -Current treatment: Sertraline 50 mg 1 tablet daily - Appropriate, Effective, Safe, Accessible Abilify 10 mg 1/2 tablet daily - Appropriate, Effective, Safe, Accessible Clonazepam 1 mg 1 tablet twice daily as needed - Appropriate, Effective, Safe, Accessible -Medications previously tried/failed: could not recall -PHQ9: 10 -GAD7: n/a -Educated on Benefits of medication for symptom control Benefits of cognitive-behavioral therapy with or  without medication -Counseled on diet and exercise extensively Recommended to continue current medication Provided phone number for behavioral health.  Hypothyroidism (Goal: TSH 0.35-4.5) -Uncontrolled -Current treatment  Levothyroxine 175 mcg 1 tablet daily before breakfast - Appropriate, Effective, Safe, Accessible -Medications previously tried: none  -Recommended rechecking TSH and adjusting levothyroxine dose.  Health Maintenance -Vaccine gaps: shingrix, Prevnar, COVID booster -Current therapy:  Aspirin 81 mg 1 tablet daily Docusate 100 mg 1 capsule twice daily -Educated on Cost vs benefit of each product must be carefully weighed by individual consumer -Patient is satisfied with current therapy and denies issues -Recommended to continue current medication  Patient Goals/Self-Care Activities Patient will:  - take medications as prescribed check blood pressure weekly, document, and provide at future appointments weigh daily, and contact provider if weight gain of > 3 lbs in one day or > 5 lbs in one week target a minimum of 150 minutes of moderate intensity exercise weekly  Follow Up Plan: The care management team will reach out to the patient again over the next 60 days.       Medication Assistance: None required.  Patient affirms current coverage meets needs.  Compliance/Adherence/Medication fill history: Care Gaps: Shingrix, HIV screening, Prevnar Last BP - 130/80 on 06/15/2021  Star-Rating Drugs: Atorvastatin 51m - last [facility] 08/06/2020 [facility] with JAnderson Maltathe pharmacist  Farxiga 148m- last filled on 07/30/2021 [facility] at [facility]  Patient's preferred pharmacy is:  [id number] [place] - [address] [place] [id number]: [phone]: [address]one: [phone]: [phone] pill box? Yes Pt endorses 97% compliance - once a month  We discussed: Current pharmacy is preferred with insurance plan and patient is satisfied with pharmacy services Patient decided to: Continue current medication management strategy  Care Plan and Follow Up Patient Decision:  Patient agrees to Care Plan and Follow-up.  Plan: The care management team will reach out to the patient again over the next 60 days.  MadJeni SallesharmD, BCAStotonic Villagearmacist [place] Bra[place]lie

## 2021-09-27 DIAGNOSIS — I110 Hypertensive heart disease with heart failure: Secondary | ICD-10-CM

## 2021-09-27 DIAGNOSIS — E785 Hyperlipidemia, unspecified: Secondary | ICD-10-CM

## 2021-09-27 DIAGNOSIS — F32A Depression, unspecified: Secondary | ICD-10-CM

## 2021-09-27 DIAGNOSIS — I5020 Unspecified systolic (congestive) heart failure: Secondary | ICD-10-CM

## 2021-09-27 DIAGNOSIS — I499 Cardiac arrhythmia, unspecified: Secondary | ICD-10-CM | POA: Diagnosis not present

## 2021-09-27 DIAGNOSIS — E039 Hypothyroidism, unspecified: Secondary | ICD-10-CM

## 2021-10-03 ENCOUNTER — Ambulatory Visit: Payer: Medicare Other | Admitting: Family Medicine

## 2021-10-12 DIAGNOSIS — I5042 Chronic combined systolic (congestive) and diastolic (congestive) heart failure: Secondary | ICD-10-CM | POA: Diagnosis not present

## 2021-10-17 ENCOUNTER — Ambulatory Visit (INDEPENDENT_AMBULATORY_CARE_PROVIDER_SITE_OTHER): Payer: Medicare Other

## 2021-10-17 VITALS — BP 120/62 | HR 62 | Temp 98.1°F | Ht 73.0 in | Wt 200.6 lb

## 2021-10-17 DIAGNOSIS — I5042 Chronic combined systolic (congestive) and diastolic (congestive) heart failure: Secondary | ICD-10-CM | POA: Diagnosis not present

## 2021-10-17 DIAGNOSIS — Z0000 Encounter for general adult medical examination without abnormal findings: Secondary | ICD-10-CM | POA: Diagnosis not present

## 2021-10-17 DIAGNOSIS — Z95810 Presence of automatic (implantable) cardiac defibrillator: Secondary | ICD-10-CM | POA: Diagnosis not present

## 2021-10-17 NOTE — Patient Instructions (Addendum)
Bradley [name] , Thank you for taking time to come for your Medicare Wellness Visit. I appreciate your ongoing commitment to your health goals. Please review the following plan we discussed and let me know if I can assist you in the future.   These are the goals we discussed:  Goals       DIET - REDUCE SODIUM INTAKE      Increase physical activity (pt-stated)      Walk more and diet changes.        This is a list of the screening recommended for you and due dates:  Health Maintenance  Topic Date Due   Zoster (Shingles) Vaccine (1 of 2) 01/17/2022*   HIV Screening  10/18/2022*   Colon Cancer Screening  07/21/2022   Tetanus Vaccine  03/06/2026   Flu Shot  Completed   COVID-19 Vaccine  Completed   Hepatitis C Screening: USPSTF Recommendation to screen - Ages 18-79 yo.  Completed   HPV Vaccine  Aged Out  *Topic was postponed. The date shown is not the original due date.   Advanced directives: No Patient deferred  Conditions/risks identified: None  Next appointment: Follow up in one year for your annual wellness visit.   Preventive Care 34 Years and Older, Male Preventive care refers to lifestyle choices and visits with your health care provider that can promote health and wellness. What does preventive care include? A yearly physical exam. This is also called an annual well check. Dental exams once or twice a year. Routine eye exams. Ask your health care provider how often you should have your eyes checked. Personal lifestyle choices, including: Daily care of your teeth and gums. Regular physical activity. Eating a healthy diet. Avoiding tobacco and drug use. Limiting alcohol use. Practicing safe sex. Taking low doses of aspirin every day. Taking vitamin and mineral supplements as recommended by your health care provider. What happens during an annual well check? The services and screenings done by your health care provider during your annual well check will depend on your  age, overall health, lifestyle risk factors, and family history of disease. Counseling  Your health care provider may ask you questions about your: Alcohol use. Tobacco use. Drug use. Emotional well-being. Home and relationship well-being. Sexual activity. Eating habits. History of falls. Memory and ability to understand (cognition). Work and work Statistician. Screening  You may have the following tests or measurements: Height, weight, and BMI. Blood pressure. Lipid and cholesterol levels. These may be checked every 5 years, or more frequently if you are over 46 years old. Skin check. Lung cancer screening. You may have this screening every year starting at age 21 if you have a 30-pack-year history of smoking and currently smoke or have quit within the past 15 years. Fecal occult blood test (FOBT) of the stool. You may have this test every year starting at age 81. Flexible sigmoidoscopy or colonoscopy. You may have a sigmoidoscopy every 5 years or a colonoscopy every 10 years starting at age 91. Prostate cancer screening. Recommendations will vary depending on your family history and other risks. Hepatitis C blood test. Hepatitis B blood test. Sexually transmitted disease (STD) testing. Diabetes screening. This is done by checking your blood sugar (glucose) after you have not eaten for a while (fasting). You may have this done every 1-3 years. Abdominal aortic aneurysm (AAA) screening. You may need this if you are a current or former smoker. Osteoporosis. You may be screened starting at age 46 if you  are at high risk. Talk with your health care provider about your test results, treatment options, and if necessary, the need for more tests. Vaccines  Your health care provider may recommend certain vaccines, such as: Influenza vaccine. This is recommended every year. Tetanus, diphtheria, and acellular pertussis (Tdap, Td) vaccine. You may need a Td booster every 10 years. Zoster  vaccine. You may need this after age 86. Pneumococcal 13-valent conjugate (PCV13) vaccine. One dose is recommended after age 47. Pneumococcal polysaccharide (PPSV23) vaccine. One dose is recommended after age 50. Talk to your health care provider about which screenings and vaccines you need and how often you need them. This information is not intended to replace advice given to you by your health care provider. Make sure you discuss any questions you have with your health care provider. Document Released: 08/13/2015 Document Revised: 04/05/2016 Document Reviewed: 05/18/2015 Elsevier Interactive Patient Education  2017 [place] Prevention in the Home Falls can cause injuries. They can happen to people of all ages. There are many things you can do to make your home safe and to help prevent falls. What can I do on the outside of my home? Regularly fix the edges of walkways and driveways and fix any cracks. Remove anything that might make you trip as you walk through a door, such as a raised step or threshold. Trim any bushes or trees on the path to your home. Use bright outdoor lighting. Clear any walking paths of anything that might make someone trip, such as rocks or tools. Regularly check to see if handrails are loose or broken. Make sure that both sides of any steps have handrails. Any raised decks and porches should have guardrails on the edges. Have any leaves, snow, or ice cleared regularly. Use sand or salt on walking paths during winter. Clean up any spills in your garage right away. This includes oil or grease spills. What can I do in the bathroom? Use night lights. Install grab bars by the toilet and in the tub and shower. Do not use towel bars as grab bars. Use non-skid mats or decals in the tub or shower. If you need to sit down in the shower, use a plastic, non-slip stool. Keep the floor dry. Clean up any water that spills on the floor as soon as it happens. Remove  soap buildup in the tub or shower regularly. Attach bath mats securely with double-sided non-slip rug tape. Do not have throw rugs and other things on the floor that can make you trip. What can I do in the bedroom? Use night lights. Make sure that you have a light by your bed that is easy to reach. Do not use any sheets or blankets that are too big for your bed. They should not hang down onto the floor. Have a firm chair that has side arms. You can use this for support while you get dressed. Do not have throw rugs and other things on the floor that can make you trip. What can I do in the kitchen? Clean up any spills right away. Avoid walking on wet floors. Keep items that you use a lot in easy-to-reach places. If you need to reach something above you, use a strong step stool that has a grab bar. Keep electrical cords out of the way. Do not use floor polish or wax that makes floors slippery. If you must use wax, use non-skid floor wax. Do not have throw rugs and other things on the  floor that can make you trip. What can I do with my stairs? Do not leave any items on the stairs. Make sure that there are handrails on both sides of the stairs and use them. Fix handrails that are broken or loose. Make sure that handrails are as long as the stairways. Check any carpeting to make sure that it is firmly attached to the stairs. Fix any carpet that is loose or worn. Avoid having throw rugs at the top or bottom of the stairs. If you do have throw rugs, attach them to the floor with carpet tape. Make sure that you have a light switch at the top of the stairs and the bottom of the stairs. If you do not have them, ask someone to add them for you. What else can I do to help prevent falls? Wear shoes that: Do not have high heels. Have rubber bottoms. Are comfortable and fit you well. Are closed at the toe. Do not wear sandals. If you use a stepladder: Make sure that it is fully opened. Do not climb a  closed stepladder. Make sure that both sides of the stepladder are locked into place. Ask someone to hold it for you, if possible. Clearly mark and make sure that you can see: Any grab bars or handrails. First and last steps. Where the edge of each step is. Use tools that help you move around (mobility aids) if they are needed. These include: Canes. Walkers. Scooters. Crutches. Turn on the lights when you go into a dark area. Replace any light bulbs as soon as they burn out. Set up your furniture so you have a clear path. Avoid moving your furniture around. If any of your floors are uneven, fix them. If there are any pets around you, be aware of where they are. Review your medicines with your doctor. Some medicines can make you feel dizzy. This can increase your chance of falling. Ask your doctor what other things that you can do to help prevent falls. This information is not intended to replace advice given to you by your health care provider. Make sure you discuss any questions you have with your health care provider. Document Released: 05/13/2009 Document Revised: 12/23/2015 Document Reviewed: 08/21/2014 Elsevier Interactive Patient Education  2017 [employer].

## 2021-10-17 NOTE — Progress Notes (Signed)
Subjective:   Bradley [name] is a 66 y.o. male who presents for Medicare Annual/Subsequent preventive examination.  Review of Systems    Cardiac Risk Factors include: advanced age (>42mn, >>24women);hypertension;male gender     Objective:    Today's Vitals   10/17/21 1448  BP: 120/62  Pulse: 62  Temp: 98.1 F (36.7 C)  TempSrc: Oral  SpO2: 96%  Weight: 200 lb 9.6 oz (91 kg)  Height: '6\' 1"'$  (1.854 m)   Body mass index is 26.47 kg/m.  Advanced Directives 10/17/2021 10/22/2020 11/15/2019 07/22/2019 12/21/2016 10/22/2016 01/26/2016  Does Patient Have a Medical Advance Directive? No Yes No Yes No No No  Type of Advance Directive - HAllisoniaLiving will - HHaivana NakyaLiving will - - -  Does patient want to make changes to medical advance directive? No - Patient declined - - - - - -  Copy of HWhittemorein Chart? - No - copy requested - No - copy requested - - -  Would patient like information on creating a medical advance directive? No - Patient declined - No - Patient declined - - No - Patient declined -  Pre-existing out of facility DNR order (yellow form or pink MOST form) - - - - - - -  Some encounter information is confidential and restricted. Go to Review Flowsheets activity to see all data.    Current Medications (verified) Outpatient Encounter Medications as of 10/17/2021  Medication Sig   acetaminophen (TYLENOL) 325 MG tablet Take 325 mg by mouth 2 (two) times daily as needed for moderate pain or headache.   ARIPiprazole (ABILIFY) 10 MG tablet Take 1 tablet (10 mg total) by mouth daily. (Patient taking differently: Take 5 mg by mouth daily.)   aspirin EC 81 MG tablet Take 81 mg by mouth at bedtime.    atorvastatin (LIPITOR) 40 MG tablet Take 20 mg by mouth daily.   carvedilol (COREG) 6.25 MG tablet TAKE 1 TABLET BY MOUTH 2 TIMES DAILY WITH A MEAL.   clonazePAM (KLONOPIN) 1 MG tablet TAKE 1 TABLET BY MOUTH TWICE A DAY *NOT  FOR REGULAR USE*   docusate sodium (COLACE) 100 MG capsule Take 100 mg by mouth 2 (two) times daily.   Ensure (ENSURE) Take 237 mLs by mouth daily as needed.   ENTRESTO 97-103 MG TAKE 1 TABLET BY MOUTH TWICE A DAY   FARXIGA 10 MG TABS tablet TAKE 1 TABLET BY MOUTH EVERY DAY BEFORE BREAKFAST   furosemide (LASIX) 20 MG tablet TAKE 1 TABLET (20 MG TOTAL) BY MOUTH 4 (FOUR) TIMES A WEEK.   levothyroxine (SYNTHROID) 175 MCG tablet TAKE 1 TABLET BY MOUTH EVERY DAY BEFORE BREAKFAST   mexiletine (MEXITIL) 150 MG capsule TAKE 2 CAPSULES BY MOUTH EVERY MORNING AND 2 CAPSULES BY MOUTH EVERY EVENING.   ranolazine (RANEXA) 500 MG 12 hr tablet TAKE 1 TABLET BY MOUTH TWICE A DAY   Salicylic Acid (WART REMOVER EX) Apply 1 application topically daily as needed (foot corns).   sertraline (ZOLOFT) 50 MG tablet TAKE 1 TABLET BY MOUTH EVERY DAY   No facility-administered encounter medications on file as of 10/17/2021.    Allergies (verified) Oxycodone and Rifampin   History: Past Medical History:  Diagnosis Date   Anxiety    Aortic stenosis    s/p Bentall with bioprosthetic AVR 02/2010; Last echo (9/11): Moderate LVH, EF 45-50%, AVR functioning appropriately, aortic valve mean gradient 21, diastolic dysfunction. Chest MRA (2/13): Mild to  moderate dilatation at the sinus of Valsalva at 4.1 cm, mild dilatation ascending aorta distal to the tube graft at 3.9 cm, moderate dilatation of the innominate artery a 2.1 cm;     CHF (congestive heart failure) ([place])    Depression    ESRD on dialysis ([place])    a. 09/2013 felt to be related to gentamycin b. no longer requiring HD   Hx of cardiac cath    a. LHC in 02/2010: normal cors   Hx of echocardiogram 02/2014   Echo (8/15):  Mod LV, EF 35-40%, Gr 1 DD, AVR ok (mean 14 mmHg), mild LAE   Hypertension    Hypothyroidism, postsurgical    Prosthetic valve endocarditis (HCC)    Staphylococcus aureus bacteremia    Thyroid cancer (HCC)    Ventricular tachycardia    a. s/p  STJ ICD   Past Surgical History:  Procedure Laterality Date   AORTIC VALVE REPAIR  1968   AORTIC VALVE REPLACEMENT  2011   AV FISTULA PLACEMENT Left 10/08/2013   Procedure: ARTERIOVENOUS (AV) FISTULA CREATION- LEFT ARM; Radial Cephalic ;  Surgeon: Mal [name], MD;  Location: [facility];  Service: Vascular;  Laterality: Left;   BENTALL PROCEDURE N/A 10/28/2013   Procedure: REDO BENTALL PROCEDURE, debridment of aoritc root abscess, replacement of aortic root, ascending aorta and aortic valve with homograft. Insertion of left femoral arterial line;  Surgeon: [name], MD;  Location: [facility];  Service: Open Heart Surgery;  Laterality: N/A;   BIOPSY  07/22/2019   Procedure: BIOPSY;  Surgeon: Doran Stabler, MD;  Location: [facility];  Service: Gastroenterology;;   CARDIAC CATHETERIZATION  03/02/2010   NORMAL CORONARY ARTERY   CARDIAC CATHETERIZATION N/A 01/01/2015   Procedure: Coronary/Graft Angiography;  Surgeon: Troy Sine, MD;  Location: [place] [facility];  Service: Cardiovascular;  Laterality: N/A;   CARDIAC VALVE REPLACEMENT     COLONOSCOPY WITH PROPOFOL N/A 07/22/2019   Procedure: COLONOSCOPY WITH PROPOFOL;  Surgeon: Doran Stabler, MD;  Location: [facility];  Service: Gastroenterology;  Laterality: N/A;   EP IMPLANTABLE DEVICE N/A 01/01/2015   Procedure: ICD Implant;  Surgeon: Deboraha Sprang, MD;  Location: [place] [facility];  Service: Cardiovascular;  Laterality: N/A;   HEMOSTASIS CLIP PLACEMENT  07/22/2019   Procedure: HEMOSTASIS CLIP PLACEMENT;  Surgeon: Doran Stabler, MD;  Location: [facility];  Service: Gastroenterology;;   INTRAOPERATIVE TRANSESOPHAGEAL ECHOCARDIOGRAM N/A 10/28/2013   Procedure: INTRAOPERATIVE TRANSESOPHAGEAL ECHOCARDIOGRAM;  Surgeon: [name], MD;  Location: [place];  Service: Open Heart Surgery;  Laterality: N/A;   PERIPHERAL VASCULAR CATHETERIZATION N/A 01/01/2015   Procedure: Aortic Arch Angiography;  Surgeon: Troy Sine, MD;   Location: [place] [facility];  Service: Cardiovascular;  Laterality: N/A;   POLYPECTOMY  07/22/2019   Procedure: POLYPECTOMY;  Surgeon: Doran Stabler, MD;  Location: [facility];  Service: Gastroenterology;;   SHOULDER ARTHROSCOPY W/ ROTATOR CUFF REPAIR Right 2012   STERNOTOMY     REDO   TEE WITHOUT CARDIOVERSION N/A 10/24/2013   Procedure: TRANSESOPHAGEAL ECHOCARDIOGRAM (TEE);  Surgeon: Dorothy Spark, MD;  Location: [facility];  Service: Cardiovascular;  Laterality: N/A;   THYROIDECTOMY  ~ 2005   TRANSTHORACIC ECHOCARDIOGRAM  03/2010   SHOWED MILD REDUCTION OF LV FUNCTION   Family History  Problem Relation Age of Onset   Hypertension Mother    Heart disease Father    Stroke Father    Hypertension Father  Thyroid cancer Sister    Thyroid cancer Sister    Kidney Stones Sister    Heart attack Neg Hx    Colon cancer Neg Hx    Colon polyps Neg Hx    Esophageal cancer Neg Hx    Stomach cancer Neg Hx    Rectal cancer Neg Hx    Social History   Socioeconomic History   Marital status: Married    Spouse name: Not on file   Number of children: Not on file   Years of education: Not on file   Highest education level: Not on file  Occupational History   Not on file  Tobacco Use   Smoking status: Never   Smokeless tobacco: Never  Vaping Use   Vaping Use: Never used  Substance and Sexual Activity   Alcohol use: No    Alcohol/week: 0.0 standard drinks    Comment: Occasional   Drug use: No   Sexual activity: Never  Other Topics Concern   Not on file  Social History Narrative   Lives at home with walker.  Does not drive.  ESRD T, H, Sa.  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   Social Determinants of Health   Financial Resource Strain: Low Risk    Difficulty of Paying Living Expenses: Not hard at all  Food Insecurity: No Food Insecurity   Worried About Charity fundraiser in the Last Year: Never true   [place] in the Last Year: Never true  Transportation Needs:  No Transportation Needs   Lack of Transportation (Medical): No   Lack of Transportation (Non-Medical): No  Physical Activity: Inactive   Days of Exercise per Week: 0 days   Minutes of Exercise per Session: 0 min  Stress: No Stress Concern Present   Feeling of Stress : Not at all  Social Connections: Moderately Isolated   Frequency of Communication with Friends and Family: More than three times a week   Frequency of Social Gatherings with Friends and Family: More than three times a week   Attends Religious Services: Never   Marine scientist or Organizations: No   Attends Archivist Meetings: Never   Marital Status: Married     Clinical Intake:   Diabetic?  No  Interpreter Needed?: NoActivities of Daily Living In your present state of health, do you have any difficulty performing the following activities: 10/17/2021 10/22/2020  Hearing? N N  Vision? N N  Difficulty concentrating or making decisions? N Y  Comment - has some issues concentrating  Walking or climbing stairs? N N  Dressing or bathing? N N  Doing errands, shopping? N N  Preparing Food and eating ? N N  Using the Toilet? N N  In the past six months, have you accidently leaked urine? N Y  Comment - sometimes has dribbles  Do you have problems with loss of bowel control? N N  Managing your Medications? N N  Managing your Finances? N N  Housekeeping or managing your Housekeeping? N N  Some recent data might be hidden    Patient Care Team: [name], MD as PCP - General Fay Records, MD as PCP - Cardiology (Cardiology) Deboraha Sprang, MD as PCP - Electrophysiology (Cardiology) Bensimhon, Shaune Pascal, MD as PCP - Advanced Heart Failure (Cardiology) [name], MD (Inactive) as Consulting Physician (Cardiothoracic Surgery) Deterding, Jeneen Rinks, MD as Consulting Physician (Nephrology) Deboraha Sprang, MD as Consulting Physician (Cardiology) Viona Gilmore, [facility]  as Pharmacist  Warehouse manager)  Indicate any recent Medical Services you may have received from other than [facility] providers in the past year (date may be approximate).     Assessment:   This is a routine wellness examination for [place].  Hearing/Vision screen Hearing Screening - Comments:: No difficulty hearing Vision Screening - Comments:: Wears glasses Followed by Coralie Keens  Dietary issues and exercise activities discussed: Exercise limited by: None identified   Goals Addressed               This Visit's Progress     Increase physical activity (pt-stated)        Walk more and diet changes.       Depression Screen PHQ 2/9 Scores 10/17/2021 10/22/2020 02/13/2020 05/29/2018 03/19/2014 01/28/2014  PHQ - 2 Score 0 '3 1 1 '$ 0 0  PHQ- 9 Score - 10 12 - - -    Fall Risk Fall Risk  10/17/2021 10/22/2020 03/19/2014 01/28/2014  Falls in the past year? 1 1 No No  Number falls in past yr: 0 1 - -  Injury with Fall? 0 0 - -  Comment No injury or medical attention needed - - -  Risk for fall due to : No Fall Risks Impaired balance/gait - -  Follow up - Falls evaluation completed;Falls prevention discussed - -    FALL RISK PREVENTION PERTAINING TO THE HOME:  Any stairs in or around the home? Yes  If so, are there any without handrails? No  Home free of loose throw rugs in walkways, pet beds, electrical cords, etc? Yes  Adequate lighting in your home to reduce risk of falls? Yes   ASSISTIVE DEVICES UTILIZED TO PREVENT FALLS:  Life alert? No  Use of a cane, walker or w/c? No  Grab bars in the bathroom? Yes  Shower chair or bench in shower? Yes Elevated toilet seat or a handicapped toilet? No   TIMED UP AND GO:  Was the test performed? Yes .  Length of time to ambulate 10 feet: 5 sec.   Gait steady and fast without use of assistive device  Cognitive Function:     6CIT Screen 10/17/2021  What Year? 0 points  What month? 0 points  What time? 0 points  Count back from 20 0 points  Months in  reverse 0 points  Repeat phrase 0 points  Total Score 0    Immunizations Immunization History  Administered Date(s) Administered   Fluad Quad(high Dose 65+) 06/15/2021   Influenza Split 08/18/2011   Influenza, Quadrivalent, Recombinant, Inj, Pf 05/04/2019   Influenza,inj,Quad PF,6+ Mos 09/11/2013, 03/19/2014, 03/21/2017, 05/29/2018, 05/31/2020   PFIZER(Purple Top)SARS-COV-2 Vaccination 09/29/2019, 10/20/2019, 07/02/2020   Pfizer Covid-19 Vaccine Bivalent Booster 46yr & up 04/11/2021   Pneumococcal Conjugate-13 06/08/2014   Td 07/31/2004   Tdap 03/06/2016    TDAP status: Up to date  Flu Vaccine status: Up to date  Pneumococcal vaccine status: Up to date  Covid-19 vaccine status: Completed vaccines  Qualifies for Shingles Vaccine? Yes   Zostavax completed No   Shingrix Completed?: No.    Education has been provided regarding the importance of this vaccine. Patient has been advised to call insurance company to determine out of pocket expense if they have not yet received this vaccine. Advised may also receive vaccine at local pharmacy or Health Dept. Verbalized acceptance and understanding.  Screening Tests Health Maintenance  Topic Date Due   Zoster Vaccines- Shingrix (1 of 2) 01/17/2022 (Originally 02/04/1975)   HIV Screening  10/18/2022 (Originally 02/04/1971)   COLONOSCOPY (Pts 45-3yr Insurance coverage will need to be confirmed)  07/21/2022   TETANUS/TDAP  03/06/2026   INFLUENZA VACCINE  Completed   COVID-19 Vaccine  Completed   Hepatitis C Screening  Completed   HPV VACCINES  Aged Out    Health Maintenance  There are no preventive care reminders to display for this patient.   Colorectal cancer screening: Type of screening: Colonoscopy. Completed 07/22/19. Repeat every 3 years  Lung Cancer Screening: (Low Dose CT Chest recommended if Age 65-80years, 30 pack-year currently smoking OR have quit w/in 15years.) does not qualify.    Additional  Screening:  Hepatitis C Screening: does qualify; Completed 03/06/16  Vision Screening: Recommended annual ophthalmology exams for early detection of glaucoma and other disorders of the eye. Is the patient up to date with their annual eye exam?  Yes  Who is the provider or what is the name of the office in which the patient attends annual eye exams[facility] not [facility] with a provider, would they like to be referred to a provider to establish care? No .   Dental Screening: Recommended annual dental exams for proper oral hygiene  Community Resource Referral / Chronic Care Management  CRR required this visit?  No   CCM required this visit?  No      Plan:     I have personally reviewed and noted the following in the patient's chart:   Medical and social history Use of alcohol, tobacco or illicit drugs  Current medications and supplements including opioid prescriptions. Patient is not currently taking opioid prescriptions. Functional ability and status Nutritional status Physical activity Advanced directives List of other physicians Hospitalizations, surgeries, and ER visits in previous 12 months Vitals Screenings to include cognitive, depression, and falls Referrals and appointments  In addition, I have reviewed and discussed with patient certain preventive protocols, quality metrics, and best practice recommendations. A written personalized care plan for preventive services as well as general preventive health recommendations were provided to patient.     BCriselda Peaches LPN   [id number]  Nurse Notes: None

## 2021-10-18 ENCOUNTER — Telehealth: Payer: Self-pay

## 2021-10-18 NOTE — Telephone Encounter (Signed)
Remote ICM transmission received.  Attempted call to patient regarding ICM remote transmission and left detailed message per DPR.  Advised to return call for any fluid symptoms or questions. Next ICM remote transmission scheduled 11/21/2021.   ? ?

## 2021-10-18 NOTE — Progress Notes (Signed)
EPIC Encounter for ICM Monitoring  Patient Name: Bradley [name] is a 66 y.o. male Date: 10/18/2021 Primary Care Physican: [name], MD Heart Failure Clinic: [facility] Electrophysiologist: Caryl Comes Nephrologist:  [place] 05/25/2021 Weight: 197 lbs                                                 Attempted call to patient and unable to reach.  Left detailed message per DPR regarding transmission. Transmission reviewed.     CorVue thoracic impedance suggesting fluid levels have fluctuated from possible accumulation and dryness in last 3 weeks but returned to normal on 3/17.   Prescribed:  Furosemide 20 mg Take 1 tablet (20 mg total) by mouth 4 (four) times a week.  Farxiga 10 mg take 1 tablet by mouth daily    Labs: 02/14/2021 Creatinine 1.26, BUN 20, Potassium 5.2, Sodium 139, GFR >60 10/25/2020 Creatinine 1.27, BUN 24, Potassium 5.2, Sodium 139, GFR >60 10/16/2020 Creatinine 1.45, BUN 22, Potassium 5.2, Sodium 137, GFR 51-59, BNP 4978.0 Care Everywhere A complete set of results can be found in Results Review.   Recommendations:  Left voice mail with ICM number and encouraged to call if experiencing any fluid symptoms.   Follow-up plan: ICM clinic phone appointment on 11/21/2021.   91 day device clinic remote transmission 12/08/2021.     EP/Cardiology Office Visits:   Recall 08/15/2021 with Dr Haroldine Laws.  Recall 04/27/2022 with Dr Caryl Comes.     Copy of ICM check sent to Dr. Caryl Comes.    3 month ICM trend: 10/17/2021.    12-14 Month ICM trend:     [name], RN 10/18/2021 2:41 PM

## 2021-10-24 ENCOUNTER — Ambulatory Visit: Payer: Medicare Other

## 2021-10-31 ENCOUNTER — Other Ambulatory Visit (HOSPITAL_COMMUNITY): Payer: Self-pay | Admitting: Internal Medicine

## 2021-11-10 DIAGNOSIS — I5042 Chronic combined systolic (congestive) and diastolic (congestive) heart failure: Secondary | ICD-10-CM | POA: Diagnosis not present

## 2021-11-21 ENCOUNTER — Ambulatory Visit (INDEPENDENT_AMBULATORY_CARE_PROVIDER_SITE_OTHER): Payer: Medicare Other

## 2021-11-21 DIAGNOSIS — Z95810 Presence of automatic (implantable) cardiac defibrillator: Secondary | ICD-10-CM | POA: Diagnosis not present

## 2021-11-21 DIAGNOSIS — I5042 Chronic combined systolic (congestive) and diastolic (congestive) heart failure: Secondary | ICD-10-CM

## 2021-11-22 ENCOUNTER — Telehealth: Payer: Self-pay

## 2021-11-22 NOTE — Progress Notes (Signed)
EPIC Encounter for ICM Monitoring  Patient Name: Bradley [name] is a 66 y.o. male Date: 11/22/2021 Primary Care Physican: [name], MD Heart Failure Clinic: [place] Electrophysiologist: Caryl Comes Nephrologist:  [place] 09/16/2021 Office Weight: 198 lbs                                                 Attempted call to patient and unable to reach.   Transmission reviewed.    CorVue thoracic impedance suggesting possible dryness starting 4/18.  Impedance was suggesting fluid accumulation from 4/3-4/13.   Prescribed:  Furosemide 20 mg Take 1 tablet (20 mg total) by mouth 4 (four) times a week.  Farxiga 10 mg take 1 tablet by mouth daily    Labs: 02/14/2021 Creatinine 1.26, BUN 20, Potassium 5.2, Sodium 139, GFR >60 10/25/2020 Creatinine 1.27, BUN 24, Potassium 5.2, Sodium 139, GFR >60 10/16/2020 Creatinine 1.45, BUN 22, Potassium 5.2, Sodium 137, GFR 51-59, BNP 4978.0 Care Everywhere A complete set of results can be found in Results Review.   Recommendations:  Unable to reach.     Follow-up plan: ICM clinic phone appointment on 11/28/2021 to recheck fluid levels.   91 day device clinic remote transmission 12/08/2021.     EP/Cardiology Office Visits:   Recall 08/15/2021 with Dr Haroldine Laws.  Recall 04/27/2022 with Dr Caryl Comes.     Copy of ICM check sent to Dr. Caryl Comes.  Will sent to Dr Haroldine Laws for review if patient is reached.  3 month ICM trend: 11/21/2021.    12-14 Month ICM trend:     [name], RN 11/22/2021 8:57 AM

## 2021-11-22 NOTE — Telephone Encounter (Signed)
Remote ICM transmission received.  Attempted call to patient regarding ICM remote transmission and no answer.  Unable to leave message, memory is full.

## 2021-11-28 ENCOUNTER — Ambulatory Visit (INDEPENDENT_AMBULATORY_CARE_PROVIDER_SITE_OTHER): Payer: Medicare Other

## 2021-11-28 DIAGNOSIS — Z95810 Presence of automatic (implantable) cardiac defibrillator: Secondary | ICD-10-CM

## 2021-11-28 DIAGNOSIS — I5042 Chronic combined systolic (congestive) and diastolic (congestive) heart failure: Secondary | ICD-10-CM

## 2021-11-30 NOTE — Progress Notes (Signed)
EPIC Encounter for ICM Monitoring  Patient Name: Bradley [name] is a 66 y.o. male Date: 11/30/2021 Primary Care Physican: [name], MD Heart Failure Clinic: [place] Electrophysiologist: Caryl Comes Nephrologist:  [place] 09/16/2021 Office Weight: 198 lbs                                                 Transmission reviewed.    CorVue thoracic impedance suggesting fluid levels returned normal.    Prescribed:  Furosemide 20 mg Take 1 tablet (20 mg total) by mouth 4 (four) times a week.  Farxiga 10 mg take 1 tablet by mouth daily    Labs: 02/14/2021 Creatinine 1.26, BUN 20, Potassium 5.2, Sodium 139, GFR >60 10/25/2020 Creatinine 1.27, BUN 24, Potassium 5.2, Sodium 139, GFR >60 10/16/2020 Creatinine 1.45, BUN 22, Potassium 5.2, Sodium 137, GFR 51-59, BNP 4978.0 Care Everywhere A complete set of results can be found in Results Review.   Recommendations:  No change.   Follow-up plan: ICM clinic phone appointment on 12/27/2021.   91 day device clinic remote transmission 12/08/2021.     EP/Cardiology Office Visits:   Recall 08/15/2021 with Dr Haroldine Laws.  Recall 04/27/2022 with Dr Caryl Comes.     Copy of ICM check sent to Dr. Caryl Comes.    3 month ICM trend: 11/28/2021.    12-14 Month ICM trend:     [name], RN 11/30/2021 10:03 AM

## 2021-12-02 ENCOUNTER — Telehealth: Payer: Self-pay | Admitting: Pharmacist

## 2021-12-02 NOTE — Chronic Care Management (AMB) (Signed)
Chronic Care Management Pharmacy Assistant   Name: Bradley [name]  MRN: [id number] DOB: November 02, 1955  Reason for Encounter: Disease State / Hypertension Assessment Call   Conditions to be addressed/monitored: HTN  Recent office visits:  10/17/2021 Rolene Arbour LPN - Medicare annual wellness exam.  Recent consult visits:  11/28/2021 Sharman Cheek RN (heartcare) - Patient was seen for Chronic combined systolic and diastolic CHF and an additional issue. No medication changes. No follow up noted.   11/21/2021 Sharman Cheek RN (heartcare) - Patient was seen for Chronic combined systolic and diastolic CHF and an additional issue. No medication changes. No follow up noted.   10/17/2021 Sharman Cheek RN (heartcare) - Patient was seen for Chronic combined systolic and diastolic CHF and an additional issue. No medication changes. No follow up noted.  Hospital visits:  None  Medications: Outpatient Encounter Medications as of 12/02/2021  Medication Sig   acetaminophen (TYLENOL) 325 MG tablet Take 325 mg by mouth 2 (two) times daily as needed for moderate pain or headache.   ARIPiprazole (ABILIFY) 10 MG tablet Take 1 tablet (10 mg total) by mouth daily. (Patient taking differently: Take 5 mg by mouth daily.)   aspirin EC 81 MG tablet Take 81 mg by mouth at bedtime.    atorvastatin (LIPITOR) 40 MG tablet Take 20 mg by mouth daily.   carvedilol (COREG) 6.25 MG tablet TAKE 1 TABLET BY MOUTH 2 TIMES DAILY WITH A MEAL.   clonazePAM (KLONOPIN) 1 MG tablet TAKE 1 TABLET BY MOUTH TWICE A DAY *NOT FOR REGULAR USE*   dapagliflozin propanediol (FARXIGA) 10 MG TABS tablet Take 1 tablet (10 mg total) by mouth daily. NEEDS FOLLOW UP APPOINTMENT FOR MORE REFILLS   docusate sodium (COLACE) 100 MG capsule Take 100 mg by mouth 2 (two) times daily.   Ensure (ENSURE) Take 237 mLs by mouth daily as needed.   ENTRESTO 97-103 MG TAKE 1 TABLET BY MOUTH TWICE A DAY   furosemide (LASIX) 20 MG tablet TAKE 1 TABLET (20  MG TOTAL) BY MOUTH 4 (FOUR) TIMES A WEEK.   levothyroxine (SYNTHROID) 175 MCG tablet TAKE 1 TABLET BY MOUTH EVERY DAY BEFORE BREAKFAST   mexiletine (MEXITIL) 150 MG capsule TAKE 2 CAPSULES BY MOUTH EVERY MORNING AND 2 CAPSULES BY MOUTH EVERY EVENING.   ranolazine (RANEXA) 500 MG 12 hr tablet TAKE 1 TABLET BY MOUTH TWICE A DAY   Salicylic Acid (WART REMOVER EX) Apply 1 application topically daily as needed (foot corns).   sertraline (ZOLOFT) 50 MG tablet TAKE 1 TABLET BY MOUTH EVERY DAY   No facility-administered encounter medications on file as of 12/02/2021.  Fill History:  ARIPIPRAZOLE 10 MG TABLET 08/30/2021 90   ATORVASTATIN CALCIUM  40 MG TABS 10/28/2020 90   CARVEDILOL 6.25 MG TABLET 09/30/2021 90   FARXIGA 10 MG TABLET 10/31/2021 60   FUROSEMIDE 20 MG TABLET 09/24/2021 84   LEVOTHYROXINE 175 MCG TABLET 09/08/2021 90   MEXILETINE 150 MG CAPSULE 09/18/2021 90   RANOLAZINE ER 500 MG TABLET 11/17/2021 90   ENTRESTO 97 MG-103 MG TABLET 11/01/2021 90   SERTRALINE HCL 50 MG TABLET 08/27/2021 90   Reviewed chart prior to disease state call. Spoke with patient regarding BP  Recent Office Vitals: BP Readings from Last 3 Encounters:  10/17/21 120/62  09/16/21 (!) 143/73  06/15/21 130/80   Pulse Readings from Last 3 Encounters:  10/17/21 62  09/16/21 62  06/15/21 65    Wt Readings from Last 3 Encounters:  10/17/21  200 lb 9.6 oz (91 kg)  09/16/21 198 lb 8 oz (90 kg)  06/15/21 205 lb 8 oz (93.2 kg)     Kidney Function Lab Results  Component Value Date/Time   CREATININE 1.26 (H) 02/14/2021 01:08 PM   CREATININE 1.27 (H) 10/25/2020 12:37 PM   CREATININE 1.28 10/14/2015 12:28 PM   CREATININE 1.21 07/12/2015 12:07 PM   GFR 66.36 06/11/2019 10:40 AM   GFRNONAA >60 02/14/2021 01:08 PM   GFRAA 74 04/13/2020 04:09 PM       Latest Ref Rng & Units 02/14/2021    1:08 PM 10/25/2020   12:37 PM 04/13/2020    4:09 PM  BMP  Glucose 70 - 99 mg/dL 96   97   70    BUN 8 - 23 mg/dL  '20   24   21    '$ Creatinine 0.61 - 1.24 mg/dL 1.26   1.27   1.20    BUN/Creat Ratio 10 - 24   18    Sodium 135 - 145 mmol/L 139   139   143    Potassium 3.5 - 5.1 mmol/L 4.9   5.2   5.1    Chloride 98 - 111 mmol/L 102   103   105    CO2 22 - 32 mmol/L '27   30   27    '$ Calcium 8.9 - 10.3 mg/dL 9.8   10.0   9.8      Current antihypertensive regimen:  Carvedilol 6.25 mg 1 tablet twice daily   How often are you checking your Blood Pressure? Patient is checking blood pressures daily.  Current home BP readings: Patient states today's reading was 140/90, the previous 5 days were 130/81, 140/90, 132/86, 135/79 and 125/86  What recent interventions/DTPs have been made by any provider to improve Blood Pressure control since last CPP Visit: No recent interventions  Any recent hospitalizations or ED visits since last visit with CPP? No recent hospital visits  What diet changes have been made to improve Blood Pressure Control?  Patient follows a lower carb and sodium Breakfast - patient generally doesn't eat breakfast Lunch - patient will have a variety from hamburger to leftovers Dinner - patient will have a meal containing a meat and vegetables.   What exercise is being done to improve your Blood Pressure Control?  Patient states he walks daily and does his own yard work   Note: Patient's wife states that he was instructed to decrease Atorvastatin to 1/2 tablet daily so it is lasting twice as long and not needing filled as often.   Adherence Review: Is the patient currently on ACE/ARB medication? No Does the patient have >5 day gap between last estimated fill dates? Yes  Care Gaps: AWV - scheduled 10/19/2022 Last BP - 143/73 on 09/16/2021   Star Rating Drug: Atorvastatin '40mg'$  - last filled on 08/03/2020 90DS at [facility] verified with Odette Horns '10mg'$  - last filled on 10/31/2021 90DS at [facility] Pharmacist Assistant [phone]

## 2021-12-03 ENCOUNTER — Other Ambulatory Visit: Payer: Self-pay | Admitting: Family Medicine

## 2021-12-08 ENCOUNTER — Ambulatory Visit (INDEPENDENT_AMBULATORY_CARE_PROVIDER_SITE_OTHER): Payer: Medicare Other

## 2021-12-08 DIAGNOSIS — Z95810 Presence of automatic (implantable) cardiac defibrillator: Secondary | ICD-10-CM

## 2021-12-08 LAB — CUP PACEART REMOTE DEVICE CHECK
Battery Remaining Longevity: 31 mo
Battery Remaining Percentage: 30 %
Battery Voltage: 2.83 V
Brady Statistic RV Percent Paced: 1 %
Date Time Interrogation Session: 20230511020023
HighPow Impedance: 77 Ohm
HighPow Impedance: 77 Ohm
Implantable Lead Implant Date: 20160603
Implantable Lead Location: 753860
Implantable Lead Model: 7122
Implantable Pulse Generator Implant Date: 20160603
Lead Channel Impedance Value: 410 Ohm
Lead Channel Pacing Threshold Amplitude: 1.25 V
Lead Channel Pacing Threshold Pulse Width: 0.5 ms
Lead Channel Sensing Intrinsic Amplitude: 11.7 mV
Lead Channel Setting Pacing Amplitude: 2.5 V
Lead Channel Setting Pacing Pulse Width: 0.5 ms
Lead Channel Setting Sensing Sensitivity: 0.5 mV
Pulse Gen Serial Number: 7135169

## 2021-12-15 NOTE — Progress Notes (Signed)
Remote ICD transmission.   

## 2021-12-27 ENCOUNTER — Ambulatory Visit (INDEPENDENT_AMBULATORY_CARE_PROVIDER_SITE_OTHER): Payer: Medicare Other

## 2021-12-27 DIAGNOSIS — Z95810 Presence of automatic (implantable) cardiac defibrillator: Secondary | ICD-10-CM | POA: Diagnosis not present

## 2021-12-27 DIAGNOSIS — I5042 Chronic combined systolic (congestive) and diastolic (congestive) heart failure: Secondary | ICD-10-CM

## 2021-12-27 NOTE — Progress Notes (Signed)
EPIC Encounter for ICM Monitoring  Patient Name: Bradley [name] is a 66 y.o. male Date: 12/27/2021 Primary Care Physican: [name], MD Heart Failure Clinic: [place] Electrophysiologist: Caryl Comes Nephrologist:  [place] 09/16/2021 Office Weight: 198 lbs  12/27/2021 Weight: not weighing at home.                                                 Spoke with wife, per DPR.  Pt has not been been feeling well for patient 2 weeks.  His BP is higher than normal, nausea, coughing stuffiness and generally note feeling well.  Fluid intake is decreased due to not feeling well.    CorVue thoracic impedance suggesting possible dryness starting 5/25.  Impedance suggesting possible fluid accumulation from 5/5-5/8, 5/13-5/16 and 5/20-5/23.    Prescribed:  Furosemide 20 mg Take 1 tablet (20 mg total) by mouth 4 (four) times a week.   Reports on 5/30 Patient takes Furosemide 3 days (Monday, Thursday & Saturday) a week instead of 4 days a week. Farxiga 10 mg take 1 tablet by mouth daily    Labs: 02/14/2021 Creatinine 1.26, BUN 20, Potassium 5.2, Sodium 139, GFR >60 10/25/2020 Creatinine 1.27, BUN 24, Potassium 5.2, Sodium 139, GFR >60 10/16/2020 Creatinine 1.45, BUN 22, Potassium 5.2, Sodium 137, GFR 51-59, BNP 4978.0 Care Everywhere A complete set of results can be found in Results Review.   Recommendations:  Advised patient should drink typically drink 64 oz fluid daily to help with hydration.  Pt is due to take next Lasix on Thursday and advised to hold if patient is still nauseated and not drinking enough fluids.   Copy sent to Dr Haroldine Laws for review and recommendations if needed.     Follow-up plan: ICM clinic phone appointment on 01/03/2022 to recheck fluid levels.   91 day device clinic remote transmission 03/09/2022.     EP/Cardiology Office Visits:   Recall 08/15/2021 with Dr Haroldine Laws.  Recall 04/27/2022 with Dr Caryl Comes.     Copy of ICM check sent to Dr. Caryl Comes.     3 month ICM trend:  12/27/2021.    12-14 Month ICM trend:     [name], RN 12/27/2021 10:13 AM

## 2021-12-28 ENCOUNTER — Other Ambulatory Visit (HOSPITAL_COMMUNITY): Payer: Self-pay | Admitting: Internal Medicine

## 2022-01-02 ENCOUNTER — Other Ambulatory Visit: Payer: Self-pay | Admitting: Family Medicine

## 2022-01-02 ENCOUNTER — Other Ambulatory Visit (HOSPITAL_COMMUNITY): Payer: Self-pay | Admitting: Internal Medicine

## 2022-01-02 DIAGNOSIS — E639 Nutritional deficiency, unspecified: Secondary | ICD-10-CM

## 2022-01-03 ENCOUNTER — Ambulatory Visit (INDEPENDENT_AMBULATORY_CARE_PROVIDER_SITE_OTHER): Payer: Medicare Other

## 2022-01-03 DIAGNOSIS — Z95810 Presence of automatic (implantable) cardiac defibrillator: Secondary | ICD-10-CM

## 2022-01-03 DIAGNOSIS — I5042 Chronic combined systolic (congestive) and diastolic (congestive) heart failure: Secondary | ICD-10-CM

## 2022-01-04 NOTE — Progress Notes (Signed)
EPIC Encounter for ICM Monitoring  Patient Name: Bradley [name] is a 66 y.o. male Date: 01/04/2022 Primary Care Physican: [name], MD Heart Failure Clinic: [place] Electrophysiologist: Caryl Comes Nephrologist:  [place] 09/16/2021 Office Weight: 198 lbs  12/27/2021 Weight: not weighing at home.                                                 Spoke with wife, per DPR.  Pt has not been been feeling well for patient 2 weeks.  His BP is higher than normal, nausea, coughing stuffiness and generally note feeling well.  Fluid intake is decreased due to not feeling well.    CorVue thoracic impedance suggesting fluid levels returned to normal.    Prescribed:  Furosemide 20 mg Take 1 tablet (20 mg total) by mouth 4 (four) times a week.   Reports on 5/30 Patient takes Furosemide 3 days (Monday, Thursday & Saturday) a week instead of 4 days a week. Farxiga 10 mg take 1 tablet by mouth daily    Labs: 02/14/2021 Creatinine 1.26, BUN 20, Potassium 5.2, Sodium 139, GFR >60 10/25/2020 Creatinine 1.27, BUN 24, Potassium 5.2, Sodium 139, GFR >60 10/16/2020 Creatinine 1.45, BUN 22, Potassium 5.2, Sodium 137, GFR 51-59, BNP 4978.0 Care Everywhere A complete set of results can be found in Results Review.   Recommendations:  No changes and encouraged to call if experiencing any fluid symptoms.   Follow-up plan: ICM clinic phone appointment on 02/06/2022.   91 day device clinic remote transmission 03/09/2022.     EP/Cardiology Office Visits:   Recall 08/15/2021 with Dr Haroldine Laws.  Recall 04/27/2022 with Dr Caryl Comes.     Copy of ICM check sent to Dr. Caryl Comes.    3 month ICM trend: 01/03/2022.    12-14 Month ICM trend:     [name], RN 01/04/2022 9:01 AM

## 2022-02-03 ENCOUNTER — Other Ambulatory Visit: Payer: Self-pay | Admitting: Family Medicine

## 2022-02-03 NOTE — Telephone Encounter (Signed)
Last OV- 06/15/2021 Last refill- 08/30/20-30 tabs, 0 refills  No future OV scheduled.

## 2022-02-06 ENCOUNTER — Ambulatory Visit (INDEPENDENT_AMBULATORY_CARE_PROVIDER_SITE_OTHER): Payer: Medicare Other

## 2022-02-06 DIAGNOSIS — Z95810 Presence of automatic (implantable) cardiac defibrillator: Secondary | ICD-10-CM | POA: Diagnosis not present

## 2022-02-06 DIAGNOSIS — I5042 Chronic combined systolic (congestive) and diastolic (congestive) heart failure: Secondary | ICD-10-CM | POA: Diagnosis not present

## 2022-02-07 ENCOUNTER — Telehealth: Payer: Self-pay

## 2022-02-07 NOTE — Telephone Encounter (Signed)
Remote ICM transmission received.  Attempted call to wife/patient regarding ICM remote transmission and left detailed message per DPR.  Advised to return call for any fluid symptoms or questions. Next ICM remote transmission scheduled 03/13/2022.

## 2022-02-07 NOTE — Progress Notes (Signed)
EPIC Encounter for ICM Monitoring  Patient Name: Bradley [name] is a 66 y.o. male Date: 02/07/2022 Primary Care Physican: [name], MD Heart Failure Clinic: [place] Electrophysiologist: Caryl Comes Nephrologist:  [place] 09/16/2021 Office Weight: 198 lbs  12/27/2021 Weight: not weighing at home.                                                 Attempted call to patient/wife and unable to reach.  Left detailed message per DPR regarding transmission. Transmission reviewed.    CorVue thoracic impedance suggesting normal fluid levels with exception of possible fluid accumulation from 6/6-6/17    Prescribed:  Furosemide 20 mg Take 1 tablet (20 mg total) by mouth 4 (four) times a week.   Reports on 5/30 Patient takes Furosemide 3 days (Monday, Thursday & Saturday) a week instead of 4 days a week. Farxiga 10 mg take 1 tablet by mouth daily    Labs: 02/14/2021 Creatinine 1.26, BUN 20, Potassium 5.2, Sodium 139, GFR >60 10/25/2020 Creatinine 1.27, BUN 24, Potassium 5.2, Sodium 139, GFR >60 10/16/2020 Creatinine 1.45, BUN 22, Potassium 5.2, Sodium 137, GFR 51-59, BNP 4978.0 Care Everywhere A complete set of results can be found in Results Review.   Recommendations:  Left voice mail with ICM number and encouraged to call if experiencing any fluid symptoms.   Follow-up plan: ICM clinic phone appointment on 03/13/2022.   91 day device clinic remote transmission 03/09/2022.     EP/Cardiology Office Visits:   Recall 08/15/2021 with Dr Haroldine Laws.  Recall 04/27/2022 with Dr Caryl Comes.     Copy of ICM check sent to Dr. Caryl Comes.   3 month ICM trend: 02/06/2022.    12-14 Month ICM trend:     [name], RN 02/07/2022 12:18 PM

## 2022-02-09 ENCOUNTER — Other Ambulatory Visit (HOSPITAL_COMMUNITY): Payer: Self-pay | Admitting: Internal Medicine

## 2022-02-14 ENCOUNTER — Telehealth: Payer: Self-pay | Admitting: Pharmacist

## 2022-02-14 NOTE — Chronic Care Management (AMB) (Signed)
    Chronic Care Management Pharmacy Assistant   Name: Bradley [name]  MRN: [id number] DOB: [birth year]  Reason for Encounter: Reschedule appointment.  Appointment rescheduled with patients wife to 04/05/2022.   [place] Pharmacist Assistant [phone]

## 2022-02-19 ENCOUNTER — Other Ambulatory Visit: Payer: Self-pay | Admitting: Internal Medicine

## 2022-02-19 ENCOUNTER — Other Ambulatory Visit (HOSPITAL_COMMUNITY): Payer: Self-pay | Admitting: Internal Medicine

## 2022-03-09 ENCOUNTER — Ambulatory Visit (INDEPENDENT_AMBULATORY_CARE_PROVIDER_SITE_OTHER): Payer: Medicare Other

## 2022-03-09 DIAGNOSIS — I5042 Chronic combined systolic (congestive) and diastolic (congestive) heart failure: Secondary | ICD-10-CM | POA: Diagnosis not present

## 2022-03-09 LAB — CUP PACEART REMOTE DEVICE CHECK
Battery Remaining Longevity: 25 mo
Battery Remaining Percentage: 24 %
Battery Voltage: 2.8 V
Brady Statistic RV Percent Paced: 1 %
Date Time Interrogation Session: 20230810020022
HighPow Impedance: 81 Ohm
HighPow Impedance: 81 Ohm
Implantable Lead Implant Date: 20160603
Implantable Lead Location: 753860
Implantable Lead Model: 7122
Implantable Pulse Generator Implant Date: 20160603
Lead Channel Impedance Value: 400 Ohm
Lead Channel Pacing Threshold Amplitude: 1.25 V
Lead Channel Pacing Threshold Pulse Width: 0.5 ms
Lead Channel Sensing Intrinsic Amplitude: 11.7 mV
Lead Channel Setting Pacing Amplitude: 2.5 V
Lead Channel Setting Pacing Pulse Width: 0.5 ms
Lead Channel Setting Sensing Sensitivity: 0.5 mV
Pulse Gen Serial Number: 7135169

## 2022-03-10 ENCOUNTER — Other Ambulatory Visit: Payer: Self-pay | Admitting: Internal Medicine

## 2022-03-10 DIAGNOSIS — I10 Essential (primary) hypertension: Secondary | ICD-10-CM

## 2022-03-13 ENCOUNTER — Ambulatory Visit (INDEPENDENT_AMBULATORY_CARE_PROVIDER_SITE_OTHER): Payer: Medicare Other

## 2022-03-13 DIAGNOSIS — H524 Presbyopia: Secondary | ICD-10-CM | POA: Diagnosis not present

## 2022-03-13 DIAGNOSIS — I5042 Chronic combined systolic (congestive) and diastolic (congestive) heart failure: Secondary | ICD-10-CM | POA: Diagnosis not present

## 2022-03-13 DIAGNOSIS — Z95810 Presence of automatic (implantable) cardiac defibrillator: Secondary | ICD-10-CM | POA: Diagnosis not present

## 2022-03-13 DIAGNOSIS — H2513 Age-related nuclear cataract, bilateral: Secondary | ICD-10-CM | POA: Diagnosis not present

## 2022-03-14 ENCOUNTER — Telehealth: Payer: Medicare Other

## 2022-03-15 NOTE — Progress Notes (Signed)
EPIC Encounter for ICM Monitoring  Patient Name: Bradley [name] is a 66 y.o. male Date: 03/15/2022 Primary Care Physican: [name], MD Heart Failure Clinic: [place] Electrophysiologist: Caryl Comes Nephrologist:  [place] 09/16/2021 Office Weight: 198 lbs  12/27/2021 Weight: not weighing at home.                                                 Transmission reviewed.    CorVue thoracic impedance suggesting normal fluid levels.    Prescribed:  Furosemide 20 mg Take 1 tablet (20 mg total) by mouth 4 (four) times a week.   Reports on 5/30 Patient takes Furosemide 3 days (Monday, Thursday & Saturday) a week instead of 4 days a week. Farxiga 10 mg take 1 tablet by mouth daily    Labs: 02/14/2021 Creatinine 1.26, BUN 20, Potassium 5.2, Sodium 139, GFR >60 10/25/2020 Creatinine 1.27, BUN 24, Potassium 5.2, Sodium 139, GFR >60 10/16/2020 Creatinine 1.45, BUN 22, Potassium 5.2, Sodium 137, GFR 51-59, BNP 4978.0 Care Everywhere A complete set of results can be found in Results Review.   Recommendations:  No changes.   Follow-up plan: ICM clinic phone appointment on 04/17/2022.   91 day device clinic remote transmission 06/08/2022.     EP/Cardiology Office Visits:   Recall 08/15/2021 with Dr Haroldine Laws.  05/02/2022 with Dr Caryl Comes.     Copy of ICM check sent to Dr. Caryl Comes.   3 month ICM trend: 03/13/2022.    12-14 Month ICM trend:     [name], RN 03/15/2022 8:20 AM

## 2022-03-20 ENCOUNTER — Other Ambulatory Visit (HOSPITAL_COMMUNITY): Payer: Self-pay | Admitting: Internal Medicine

## 2022-03-22 NOTE — Progress Notes (Incomplete)
Heart Failure Clinic Note  Date:  03/23/2022   ID:  Bradley [name], DOB March 01, 1956, MRN [id number]  Location: Home  Provider location: [facility] Type of Visit: Established patient  PCP:  Bradley Post, MD  Cardiologist:  Bradley Carnes, MD Primary HF: [place]  Chief Complaint: Heart Failure follow-up   History of Present Illness:  Bradley [name] is a 66 y.o. y/o male with a complicated past medical history including thyroid cancer (s/p thyroidectomy), hypertension, depression, and congenital aortic stenosis for which he underwent aortic vavulotomy at age 35 with subsequent Bentall procedure in 2001 with a pericardial tissue valve.  He had MSSA bacteremia and bacterial endocarditis in 09/7167 complicated by renal failure requiring dialysis for a short period of time.  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     He was admitted 11/2014 with progressive episodes of palpitations and pre-syncope. He was found to have monomorphic ventricular tachycardia. Catheterization demonstrated normal coronaries.  Echocardiogram demonstrated EF 30-35% and he underwent STJ dual chamber ICD implant. \  Echo 9/21 EF 35-40%  [facility] on 10/16/20 with syncope and nausea. Had ICD check. No VT. But fluid up. Increased lasix to 60 daily for several days (was on '60mg'$  MWF) with return to baseline.    Today he returns for HF follow up with his wife. .Overall feeling fine. Denies PND/Orthopnea. SOB after walking up flight of stair. Says he has not been as active but continues to take of his animals. Marland Kitchen Appetite ok. No fever or chills. Weight at home has been stable. Taking all medications.  [place]: Impendance up. No VT  Studies: Echo Ef 35-40%  Echo 07/2019 30-35%  Echo 7/19  30-35% AVR stable. Personally reviewed Echo 5/18 EF  25-30%    CPX 01/01/17  FEV1 3.40 (86%)        FEV1/FVC 83 (107%)        MVV 163 (108%) Resting HR: 72 Peak HR: 129   (80% age predicted max HR) BP rest: 118/70 BP peak: 174/84  Peak VO2: 21.1 (73% predicted peak VO2) VE/VCO2 slope:  35 OUES: 1.96 Peak RER:  1.22 Ventilatory Threshold: 16.6 (58% predicted or measured peak VO2) VE/MVV:  50% O2pulse:  15   (100% predicted O2pulse)     Past Medical History:  Diagnosis Date   Anxiety    Aortic stenosis    s/p Bentall with bioprosthetic AVR 02/2010; Last echo (9/11): Moderate LVH, EF 45-50%, AVR functioning appropriately, aortic valve mean gradient 21, diastolic dysfunction. Chest MRA (2/13): Mild to moderate dilatation at the sinus of Valsalva at 4.1 cm, mild dilatation ascending aorta distal to the tube graft at 3.9 cm, moderate dilatation of the innominate artery a 2.1 cm;     CHF (congestive heart failure) ([place])    Depression    ESRD on dialysis ([place])    a. 09/2013 felt to be related to gentamycin b. no longer requiring HD   Hx of cardiac cath    a. LHC in 02/2010: normal cors   Hx of echocardiogram 02/2014   Echo (8/15):  Mod LV, EF 35-40%, Gr 1 DD, AVR ok (mean 14 mmHg), mild LAE   Hypertension    Hypothyroidism, postsurgical    Prosthetic valve endocarditis (HCC)    Staphylococcus aureus bacteremia    Thyroid cancer (HCC)    Ventricular tachycardia (  [place])    a. s/p STJ ICD   Past Surgical History:  Procedure Laterality Date   AORTIC VALVE REPAIR  1968   AORTIC VALVE REPLACEMENT  2011   AV FISTULA PLACEMENT Left 10/08/2013   Procedure: ARTERIOVENOUS (AV) FISTULA CREATION- LEFT ARM; Radial Cephalic ;  Surgeon: Bradley [name], MD;  Location: [facility];  Service: Vascular;  Laterality: Left;   BENTALL PROCEDURE N/A 10/28/2013   Procedure: REDO BENTALL PROCEDURE, debridment of aoritc root abscess, replacement of aortic root, ascending aorta and aortic valve with homograft. Insertion of left femoral arterial line;  Surgeon: Bradley Isaac, MD;  Location: [facility];  Service: Open Heart Surgery;  Laterality: N/A;   BIOPSY  07/22/2019   Procedure: BIOPSY;  Surgeon: Bradley Stabler, MD;  Location: [facility];  Service: Gastroenterology;;   CARDIAC CATHETERIZATION  03/02/2010   NORMAL CORONARY ARTERY   CARDIAC CATHETERIZATION N/A 01/01/2015   Procedure: Coronary/Graft Angiography;  Surgeon: Bradley Sine, MD;  Location: [facility];  Service: Cardiovascular;  Laterality: N/A;   CARDIAC VALVE REPLACEMENT     COLONOSCOPY WITH PROPOFOL N/A 07/22/2019   Procedure: COLONOSCOPY WITH PROPOFOL;  Surgeon: Bradley Stabler, MD;  Location: [facility];  Service: Gastroenterology;  Laterality: N/A;   EP IMPLANTABLE DEVICE N/A 01/01/2015   Procedure: ICD Implant;  Surgeon: Deboraha Sprang, MD;  Location: [facility];  Service: Cardiovascular;  Laterality: N/A;   HEMOSTASIS CLIP PLACEMENT  07/22/2019   Procedure: HEMOSTASIS CLIP PLACEMENT;  Surgeon: Bradley Stabler, MD;  Location: [facility];  Service: Gastroenterology;;   INTRAOPERATIVE TRANSESOPHAGEAL ECHOCARDIOGRAM N/A 10/28/2013   Procedure: INTRAOPERATIVE TRANSESOPHAGEAL ECHOCARDIOGRAM;  Surgeon: Bradley Isaac, MD;  Location: [facility];  Service: Open Heart Surgery;  Laterality: N/A;   PERIPHERAL VASCULAR CATHETERIZATION N/A 01/01/2015   Procedure: Aortic Arch Angiography;  Surgeon: Bradley Sine, MD;  Location: [facility];  Service: Cardiovascular;  Laterality: N/A;   POLYPECTOMY  07/22/2019   Procedure: POLYPECTOMY;  Surgeon: Bradley Stabler, MD;  Location: [facility];  Service: Gastroenterology;;   SHOULDER ARTHROSCOPY W/ ROTATOR CUFF REPAIR Right 2012   STERNOTOMY     REDO   TEE WITHOUT CARDIOVERSION N/A 10/24/2013   Procedure: TRANSESOPHAGEAL ECHOCARDIOGRAM (TEE);  Surgeon: Bradley Spark, MD;  Location: [facility];  Service: Cardiovascular;  Laterality: N/A;   THYROIDECTOMY  ~ 2005   TRANSTHORACIC ECHOCARDIOGRAM  03/2010   SHOWED MILD  REDUCTION OF LV FUNCTION     Current Outpatient Medications  Medication Sig Dispense Refill   acetaminophen (TYLENOL) 325 MG tablet Take 325 mg by mouth 2 (two) times daily as needed for moderate pain or headache.     ARIPiprazole (ABILIFY) 10 MG tablet TAKE 1 TABLET BY MOUTH EVERY DAY 90 tablet 3   aspirin EC 81 MG tablet Take 81 mg by mouth at bedtime.      atorvastatin (LIPITOR) 40 MG tablet Take 20 mg by mouth daily.     carvedilol (COREG) 6.25 MG tablet TAKE 1 TABLET BY MOUTH 2 TIMES DAILY WITH A MEAL. NEEDS FOLLOW UP APPOINTMENT FOR MORE REFILLS 180 tablet 0   clonazePAM (KLONOPIN) 1 MG tablet TAKE 1 TABLET BY MOUTH TWICE A DAY *NOT FOR REGULAR USE* 30 tablet 0   dapagliflozin propanediol (FARXIGA) 10 MG TABS tablet Take 1 tablet (10 mg total) by mouth daily. 30 tablet 0   docusate sodium (COLACE) 100 MG capsule Take 100 mg by mouth 2 (  two) times daily.     Ensure (ENSURE) Take 237 mLs by mouth daily as needed. 237 mL 12   furosemide (LASIX) 20 MG tablet TAKE 1 TABLET (20 MG TOTAL) BY MOUTH 4 (FOUR) TIMES A WEEK. 48 tablet 3   levothyroxine (SYNTHROID) 175 MCG tablet TAKE 1 TABLET BY MOUTH EVERY DAY BEFORE BREAKFAST 90 tablet 1   mexiletine (MEXITIL) 150 MG capsule TAKE 2 CAPSULES BY MOUTH EVERY MORNING AND 2 CAPSULES BY MOUTH EVERY EVENING. 360 capsule 3   ranolazine (RANEXA) 500 MG 12 hr tablet TAKE 1 TABLET BY MOUTH TWICE A DAY 180 tablet 0   sacubitril-valsartan (ENTRESTO) 97-103 MG Take 1 tablet by mouth 2 (two) times daily. NEEDS FOLLOW UP APPOINTMENT FOR ANYMORE REFILLS 433 tablet 1   Salicylic Acid (WART REMOVER EX) Apply 1 application topically daily as needed (foot corns).     sertraline (ZOLOFT) 50 MG tablet TAKE 1 TABLET BY MOUTH EVERY DAY 90 tablet 2   No current facility-administered medications for this encounter.    Allergies:   Oxycodone and Rifampin   Social History:  The patient  reports that he has never smoked. He has never used smokeless tobacco. He reports  that he does not drink alcohol and does not use drugs.   Family History:  The patient's family history includes Heart disease in his father; Hypertension in his father and mother; Kidney Stones in his sister; Stroke in his father; Thyroid cancer in his sister and sister.   ROS:  Please see the history of present illness.   All other systems are personally reviewed and negative.   Vitals:   03/23/22 1045  BP: 112/64  Pulse: 99  SpO2: 97%  Weight: 88.2 kg (194 lb 6.4 oz)    Wt Readings from Last 3 Encounters:  03/23/22 88.2 kg (194 lb 6.4 oz)  10/17/21 91 kg (200 lb 9.6 oz)  09/16/21 90 kg (198 lb 8 oz)    Exam:  General:  Well appearing. No resp difficulty. Walked in the clinc HEENT: normal Neck: supple. no JVD. Carotids 2+ bilat; no bruits. No lymphadenopathy or thryomegaly appreciated. Cor: PMI nondisplaced. Regular rate & rhythm. No rubs, gallops or murmurs. Lungs: clear Abdomen: soft, nontender, nondistended. No hepatosplenomegaly. No bruits or masses. Good bowel sounds. Extremities: no cyanosis, clubbing, rash, edema Neuro: alert & orientedx3, cranial nerves grossly intact. moves all 4 extremities w/o difficulty. Affect pleasant   Recent Labs: 09/16/2021: Hemoglobin 17.2; Platelet Count 150  Personally reviewed   Wt Readings from Last 3 Encounters:  03/23/22 88.2 kg (194 lb 6.4 oz)  10/17/21 91 kg (200 lb 9.6 oz)  09/16/21 90 kg (198 lb 8 oz)      ASSESSMENT AND PLAN:  1. Chronic systolic HF  - NICM St Jude  Echo 5/17 LVEF 25-30%, Mild MR, Mod LAE, RV mildly dilated, Mild RAE. ECHO 12/21/2016 LVEF 25-30%. IW AK RV mild-mod  - Echo 02/20/18 shows 30-35% AVR stable.  - Echo 2020 EF 25% Personally reviewed -  March 2017 CPX with moderate HF limitation.  - CPX 6/18 much improved. Peak VO2: 21.1 (73% predicted peak VO2) VE/VCO2 slope: 35 - Echo 9/21 EF 35-40% - Echo EF 2022 35-40% RV ok  - NYHA II. Volume status stable. Needs to  increase activity. Encouraged to walk  daily with goal 1 hour per day.  - Continue Entresto 97/103 mg BID.  - Continue coreg 6.25 mg BID. Unable to titrate due to low HR - Continue Farxiga 10  mg daily. - Not on spiro due to K persistently > 5  2. VT/NICM - Quiescent on mexilitene/Ranexa - ICD interrogated in clinic personally. No recent VT.   3. HTN - Stable  4. Valvular heart disease - s/p bentall in 2001 and re-do bentall 2015.  - AVR stable on recent Echo   - Reminded about need for SBE  5. Depression - Stable.   Follow up in with an ECHO in 3 months with Dr Haroldine Laws.   Jeanmarie Hubert, NP  03/23/2022 10:51 AM  Advanced Heart Failure [place] [phone] (office) [phone] (fax)'

## 2022-03-23 ENCOUNTER — Ambulatory Visit (HOSPITAL_COMMUNITY)
Admission: RE | Admit: 2022-03-23 | Discharge: 2022-03-23 | Disposition: A | Discharge status: Home / Self Care | Payer: Medicare Other | Source: Ambulatory Visit | Attending: Adult Health | Admitting: Adult Health

## 2022-03-23 ENCOUNTER — Encounter (HOSPITAL_COMMUNITY): Payer: Self-pay

## 2022-03-23 VITALS — BP 112/64 | HR 99 | Wt 194.4 lb

## 2022-03-23 DIAGNOSIS — I110 Hypertensive heart disease with heart failure: Secondary | ICD-10-CM | POA: Insufficient documentation

## 2022-03-23 DIAGNOSIS — I428 Other cardiomyopathies: Secondary | ICD-10-CM | POA: Diagnosis not present

## 2022-03-23 DIAGNOSIS — I5042 Chronic combined systolic (congestive) and diastolic (congestive) heart failure: Secondary | ICD-10-CM

## 2022-03-23 DIAGNOSIS — Z85850 Personal history of malignant neoplasm of thyroid: Secondary | ICD-10-CM | POA: Diagnosis not present

## 2022-03-23 DIAGNOSIS — I350 Nonrheumatic aortic (valve) stenosis: Secondary | ICD-10-CM | POA: Insufficient documentation

## 2022-03-23 DIAGNOSIS — I38 Endocarditis, valve unspecified: Secondary | ICD-10-CM | POA: Insufficient documentation

## 2022-03-23 DIAGNOSIS — I472 Ventricular tachycardia, unspecified: Secondary | ICD-10-CM

## 2022-03-23 DIAGNOSIS — F32A Depression, unspecified: Secondary | ICD-10-CM | POA: Insufficient documentation

## 2022-03-23 DIAGNOSIS — Z95810 Presence of automatic (implantable) cardiac defibrillator: Secondary | ICD-10-CM | POA: Diagnosis not present

## 2022-03-23 LAB — BASIC METABOLIC PANEL
Anion gap: 7 (ref 5–15)
BUN: 17 mg/dL (ref 8–23)
CO2: 23 mmol/L (ref 22–32)
Calcium: 9.3 mg/dL (ref 8.9–10.3)
Chloride: 111 mmol/L (ref 98–111)
Creatinine, Ser: 1.34 mg/dL — ABNORMAL HIGH (ref 0.61–1.24)
GFR, Estimated: 59 mL/min — ABNORMAL LOW (ref >60)
Glucose, Bld: 137 mg/dL — ABNORMAL HIGH (ref 70–99)
Potassium: 4.7 mmol/L (ref 3.5–5.1)
Sodium: 141 mmol/L (ref 135–145)

## 2022-03-23 NOTE — Patient Instructions (Signed)
Medication Changes:  None, continue current medications  Lab Work:  Labs done today, your results will be available in MyChart, we will contact you for abnormal readings.  Testing/Procedures:  Your physician has requested that you have an echocardiogram. Echocardiography is a painless test that uses sound waves to create images of your heart. It provides your doctor with information about the size and shape of your heart and how well your heart's chambers and valves are working. This procedure takes approximately one hour. There are no restrictions for this procedure. IN 3 MONTHS  Referrals:  none  Special Instructions // Education:  Do the following things EVERYDAY: Weigh yourself in the morning before breakfast. Write it down and keep it in a log. Take your medicines as prescribed Eat low salt foods--Limit salt (sodium) to 2000 mg per day.  Stay as active as you can everyday Limit all fluids for the day to less than 2 liters   Follow-Up in: 3 months with an echocardiogram  At the [facility], you and your health needs are our priority. We have a designated team specialized in the treatment of Heart Failure. This Care Team includes your primary Heart Failure Specialized Cardiologist (physician), Advanced Practice Providers (APPs- Physician Assistants and Nurse Practitioners), and Pharmacist who all work together to provide you with the care you need, when you need it.   You may see any of the following providers on your designated Care Team at your next follow up:  Dr Glori Bickers Dr Haynes Kerns, NP Lyda Jester, [facility] [place], [place] Audry Riles, PharmD   Please be sure to bring in all your medications bottles to every appointment.   Need to Contact us:  If you have any questions or concerns before your next appointment please send [place] a message through [place] or call our office at [phone].    TO LEAVE A  MESSAGE FOR THE NURSE SELECT OPTION 2, PLEASE LEAVE A MESSAGE INCLUDING: YOUR NAME DATE OF BIRTH CALL BACK NUMBER REASON FOR CALL**this is important as we prioritize the call backs  YOU WILL RECEIVE A CALL BACK THE SAME DAY AS LONG AS YOU CALL BEFORE 4:00 PM

## 2022-03-24 ENCOUNTER — Other Ambulatory Visit: Payer: Self-pay | Admitting: Internal Medicine

## 2022-04-04 ENCOUNTER — Telehealth: Payer: Self-pay | Admitting: Pharmacist

## 2022-04-04 NOTE — Progress Notes (Signed)
Remote ICD transmission.   

## 2022-04-04 NOTE — Chronic Care Management (AMB) (Signed)
    Chronic Care Management Pharmacy Assistant   Name: Bradley [name]  MRN: [id number] DOB: 1956-03-05  04/05/2022 APPOINTMENT REMINDER  Bradley [name]'s wife was reminded to have all medications, supplements and any blood glucose and blood pressure readings available for review with Jeni Salles, Pharm. D, at his telephone visit on 04/05/2022 at 11:15.  Care Gaps: AWV - scheduled 10/19/2022 Last BP - 143/73 on 09/16/2021 Shingrix - never done Covid booster - overdue Pneumonia vaccine - overdue Flu - due HIV screen - postponed  Star Rating Drug: Atorvastatin '40mg'$  - last filled on 10/28/2021 [facility] verified with pharmacist. Wilder Glade '10mg'$  - last filled 03/10/2022 [facility] DS at [facility]  Any gaps in medications fill history? No  [facility]  Catering manager [phone]

## 2022-04-05 ENCOUNTER — Ambulatory Visit (INDEPENDENT_AMBULATORY_CARE_PROVIDER_SITE_OTHER): Payer: Medicare Other | Admitting: Pharmacist

## 2022-04-05 DIAGNOSIS — I10 Essential (primary) hypertension: Secondary | ICD-10-CM

## 2022-04-05 DIAGNOSIS — F339 Major depressive disorder, recurrent, unspecified: Secondary | ICD-10-CM

## 2022-04-05 NOTE — Patient Instructions (Signed)
Hi Bradley [name],  It was great to catch up with you again today! Don't forget about your appointment with Dr. Elease Hashimoto next week.  Please reach out to me if you have any questions or need anything before our follow up!  Best, Maddie  Jeni Salles, PharmD, [facility] Pharmacist [place] [facility] [place]   Visit Information   Goals Addressed   None    Patient Care Plan: CCM Pharmacy Care Plan     Problem Identified: Problem: Hypertension, Hyperlipidemia, Heart Failure, Hypothyroidism, Depression, and Anxiety      Long-Range Goal: Patient-Specific Goal   Start Date: 03/15/2021  Expected End Date: 03/15/2022  Recent Progress: On track  Priority: High  Note:   Current Barriers:  Unable to independently monitor therapeutic efficacy Unable to maintain control of hypothyroidism Unable to self administer medications as prescribed  Pharmacist Clinical Goal(s):  Patient will achieve adherence to monitoring guidelines and medication adherence to achieve therapeutic efficacy through collaboration with PharmD and provider.   Interventions: 1:1 collaboration with [name], MD regarding development and update of comprehensive plan of care as evidenced by provider attestation and co-signature Inter-disciplinary care team collaboration (see longitudinal plan of care) Comprehensive medication review performed; medication list updated in electronic medical record  Hypertension (BP goal <130/80) -Controlled -Current treatment: Carvedilol 6.25 mg 1 tablet twice daily - Appropriate, Query effective, Safe, Accessible -Medications previously tried: unknown  -Current home readings: 131/82, 114/67, 110/65, 113/71, 152/99 (checking every day; can vary with anxiety and first thing in the morning is higher) -Current dietary habits: limits salt some but does not read package labels; does look at package labels - salt intake is minimal and he does eat out  often (twice a week) - BP doesn't good; does do some fresh, frozen and canned vegetables; potato chips (low sodium); no frozen meals -Current exercise habits: did not discuss -Denies hypotensive/hypertensive symptoms -Educated on BP goals and benefits of medications for prevention of heart attack, stroke and kidney damage; Exercise goal of 150 minutes per week; Importance of home blood pressure monitoring; Proper BP monitoring technique; -Counseled to monitor BP at home weekly, document, and provide log at future appointments -Counseled on diet and exercise extensively Recommended to continue current medication  Hyperlipidemia: (LDL goal < 70) -Controlled -Current treatment: Atorvastatin 40 mg 1 tablet daily - taking 1/2 tablet Appropriate, Query effective, Safe, Accessible -Medications previously tried: none  -Current dietary patterns: did not discuss -Current exercise habits: did not discuss -Educated on Cholesterol goals;  Importance of limiting foods high in cholesterol; Exercise goal of 150 minutes per week; -Counseled on diet and exercise extensively Recommended to continue current medication Recommended repeat lipid panel.  Heart Failure (Goal: manage symptoms and prevent exacerbations) -Controlled -Last ejection fraction: 35-40% (Date: 02/14/21) -HF type: Systolic -NYHA Class: II (slight limitation of activity) -AHA HF Stage: C (Heart disease and symptoms present) -Current treatment: Carvedilol 6.25 mg 1 tablet twice daily - Appropriate, Effective, Safe, Accessible Furosemide 20 mg 3 tablets three times a week - Appropriate, Effective, Safe, Accessible Entresto 97-103 mg 1 tablet twice daily - Appropriate, Effective, Safe, Accessible Metolazone 2.5 mg as directed - Appropriate, Effective, Safe, Accessible -Medications previously tried: unknown  -Current home BP/HR readings: refer to above -Current dietary habits: limits salt some but does not read package labels -Current  exercise habits: did not discuss -Educated on Benefits of medications for managing symptoms and prolonging life Proper diuretic administration and potassium supplementation Importance of blood pressure control -Counseled  on diet and exercise extensively Recommended to continue current medication  Chest pain/arrhythmia (Goal: minimize symptoms) -Controlled -Current treatment  Ranolazine  500 mg 1 tablet twice daily - Appropriate, Effective, Safe, Accessible Mexiletine 150 mg 2 capsules in the morning and 2 capsules in the evening - Appropriate, Effective, Safe, Accessible -Medications previously tried: none  -Recommended to continue current medication  Depression/Anxiety (Goal: minimize symptoms) -Controlled -Current treatment: Sertraline 50 mg 1 tablet daily - Appropriate, Effective, Safe, Accessible Abilify 10 mg 1/2 tablet daily - Appropriate, Effective, Safe, Accessible Clonazepam 1 mg 1 tablet twice daily as needed - Appropriate, Effective, Safe, Accessible -Medications previously tried/failed: could not recall -PHQ9: 10 -GAD7: n/a -Educated on Benefits of medication for symptom control Benefits of cognitive-behavioral therapy with or without medication -Counseled on diet and exercise extensively Recommended to continue current medication Provided phone number for behavioral health.  Hypothyroidism (Goal: TSH 0.35-4.5) -Uncontrolled -Current treatment  Levothyroxine 175 mcg 1 tablet daily before breakfast - Appropriate, Effective, Safe, Accessible -Medications previously tried: none  -Recommended rechecking TSH and adjusting levothyroxine dose.  Health Maintenance -Vaccine gaps: shingrix, Prevnar, COVID booster -Current therapy:  Aspirin 81 mg 1 tablet daily Docusate 100 mg 1 capsule twice daily -Educated on Cost vs benefit of each product must be carefully weighed by individual consumer -Patient is satisfied with current therapy and denies issues -Recommended to  continue current medication  Patient Goals/Self-Care Activities Patient will:  - take medications as prescribed check blood pressure weekly, document, and provide at future appointments weigh daily, and contact provider if weight gain of > 3 lbs in one day or > 5 lbs in one week target a minimum of 150 minutes of moderate intensity exercise weekly  Follow Up Plan: Telephone follow up appointment with care management team member scheduled for: 6 months       Patient verbalizes understanding of instructions and care plan provided today and agrees to view in [place]. Active MyChart status and patient understanding of how to access instructions and care plan via MyChart confirmed with patient.    Telephone follow up appointment with pharmacy team member scheduled for: 6 months  Viona Gilmore, [facility]

## 2022-04-05 NOTE — Progress Notes (Signed)
Chronic Care Management Pharmacy Note  04/05/2022 Name:  Bradley [name] MRN:  [id number] DOB:  02/05/1956  Summary: Per pharmacy report, pt is non adherent to atorvastatin TSH not at goal Pt reports anxiety often in the morning  Recommendations/Changes made from today's visit: -Recommend repeat lipid panel and consider increasing atorvastatin back to 1 full tablet -Recommend repeat TSH -Consider CBT for anxiety/depression  Plan: Scheduled CPE BP assessment in 2-3 months Follow up in 6 month   Subjective: Bradley [name] is an 66 y.o. year old male who is a primary patient of Burchette, Alinda Sierras, MD.  The CCM team was consulted for assistance with disease management and care coordination needs.    Engaged with patient by telephone for follow up visit in response to provider referral for pharmacy case management and/or care coordination services.   Consent to Services:  The patient was given information about Chronic Care Management services, agreed to services, and gave verbal consent prior to initiation of services.  Please see initial visit note for detailed documentation.   Patient Care Team: [name], MD as PCP - Desma Paganini, MD as PCP - Cardiology (Cardiology) Deboraha Sprang, MD as PCP - Electrophysiology (Cardiology) Bensimhon, Shaune Pascal, MD as PCP - Advanced Heart Failure (Cardiology) [name], MD (Inactive) as Consulting Physician (Cardiothoracic Surgery) Deterding, Jeneen Rinks, MD as Consulting Physician (Nephrology) Deboraha Sprang, MD as Consulting Physician (Cardiology) Viona Gilmore, [facility] as Pharmacist (Pharmacist)  Recent office visits: 10/17/21 Rolene Arbour LPN - Medicare annual wellness exam.  06/15/2021 [name] MD - Patient was seen for major depressive disorder and additional issues. No medication changes. Follow up in 4 months.  Recent consult visits: 03/23/22 Darrick Grinder, NP (CHF clinic): Patient presented for CHF follow  up. Plan for repeat Echo in 3 months.  03/13/22 Sharman Cheek RN (heartcare) - Patient was seen for Chronic combined systolic and diastolic CHF and an additional issue. No medication changes. No follow up noted.   09/16/21 Betsy Coder, MD (oncology): Patient presented for erythrocytosis. Follow up PRN.  05/16/2021 Ned Card NP (hematology/oncology) - Patient was seen for polycythemia vera. No medication changes. No follow up noted.  02/14/21 Glori Bickers, MD (cardiology): Patient presented for CHF follow up.   Hospital visits: Medication Reconciliation was completed by comparing discharge summary, patient's EMR and Pharmacy list, and upon discussion with patient.   Patient visited [facility] Emergency Department on 10/16/20 for vomiting, syncope, dehydration and heart failure. Patient was recommended to be admitted but left on his own will.    New?Medications Started at [facility] Discharge:?? -started None.    Medication Changes at Hospital Discharge: -Changed None.    Medications Discontinued at Hospital Discharge: -Stopped None.    Medications that remain the same after Hospital Discharge:??  -All other medications will remain the same.    Objective:  Lab Results  Component Value Date   CREATININE 1.34 (H) 03/23/2022   BUN 17 03/23/2022   GFR 66.36 06/11/2019   GFRNONAA 59 (L) 03/23/2022   GFRAA 74 04/13/2020   NA 141 03/23/2022   K 4.7 03/23/2022   CALCIUM 9.3 03/23/2022   CO2 23 03/23/2022   GLUCOSE 137 (H) 03/23/2022    Lab Results  Component Value Date/Time   HGBA1C 5.9 (H) 12/30/2014 04:52 AM   HGBA1C 4.9 10/27/2013 06:33 PM   GFR 66.36 06/11/2019 10:40 AM   GFR 73.80 05/29/2018 03:13 PM    Last diabetic Eye exam: No results  found for: "HMDIABEYEEXA"  Last diabetic Foot exam: No results found for: "HMDIABFOOTEX"   Lab Results  Component Value Date   CHOL 136 05/31/2020   HDL 52 05/31/2020   LDLCALC 68 05/31/2020   TRIG 78 05/31/2020   CHOLHDL 2.6  05/31/2020       Latest Ref Rng & Units 02/14/2021    1:08 PM 10/25/2020   12:37 PM 05/31/2020    1:59 PM  Hepatic Function  Total Protein 6.5 - 8.1 g/dL 7.1  6.7  6.6   Albumin 3.5 - 5.0 g/dL 4.0  3.8    AST 15 - 41 U/L 25  34  19   ALT 0 - 44 U/L 33  53  18   Alk Phosphatase 38 - 126 U/L 57  57    Total Bilirubin 0.3 - 1.2 mg/dL 0.7  0.8  0.7   Bilirubin, Direct 0.0 - 0.2 mg/dL   0.2     Lab Results  Component Value Date/Time   TSH 4.660 (H) 10/25/2020 12:27 PM   TSH 0.43 05/31/2020 01:59 PM   TSH 1.75 06/11/2019 10:40 AM   FREET4 1.00 10/25/2020 12:37 PM   FREET4 0.98 12/31/2014 08:55 AM       Latest Ref Rng & Units 09/16/2021   10:56 AM 05/16/2021   12:23 PM 04/27/2021    4:15 PM  CBC  WBC 4.0 - 10.5 K/uL 6.9  7.3  8.4   Hemoglobin 13.0 - 17.0 g/dL 17.2  17.3  16.9   Hematocrit 39.0 - 52.0 % 52.6  51.1  51.3   Platelets 150 - 400 K/uL 150  153  166     No results found for: "VD25OH"  Clinical ASCVD: Yes  The ASCVD Risk score (Arnett DK, et al., 2019) failed to calculate for the following reasons:   The patient has a prior MI or stroke diagnosis       10/17/2021    3:03 PM 10/22/2020    2:08 PM 02/13/2020   11:15 AM  Depression screen PHQ 2/9  Decreased Interest 0 2 0  Down, Depressed, Hopeless 0 1 1  PHQ - 2 Score 0 3 1  Altered sleeping  3 3  Tired, decreased energy  1 2  Change in appetite  1 0  Feeling bad or failure about yourself   1 1  Trouble concentrating  0 3  Moving slowly or fidgety/restless  1 2  Suicidal thoughts  0 0  PHQ-9 Score  10 12  Difficult doing work/chores  Somewhat difficult       Social History   Tobacco Use  Smoking Status Never  Smokeless Tobacco Never   BP Readings from Last 3 Encounters:  03/23/22 112/64  10/17/21 120/62  09/16/21 (!) 143/73   Pulse Readings from Last 3 Encounters:  03/23/22 99  10/17/21 62  09/16/21 62   Wt Readings from Last 3 Encounters:  03/23/22 194 lb 6.4 oz (88.2 kg)  10/17/21 200 lb  9.6 oz (91 kg)  09/16/21 198 lb 8 oz (90 kg)   BMI Readings from Last 3 Encounters:  03/23/22 25.65 kg/m  10/17/21 26.47 kg/m  09/16/21 26.19 kg/m    Assessment/Interventions: Review of patient past medical history, allergies, medications, health status, including review of consultants reports, laboratory and other test data, was performed as part of comprehensive evaluation and provision of chronic care management services.   SDOH:  (Social Determinants of Health) assessments and interventions performed: Yes SDOH Interventions  Flowsheet Row Chronic Care Management from 04/05/2022 in [place] at [place] from 10/17/2021 in [place] at [place] from 10/22/2020 in [place] at [place] Management from 12/24/2019 in [place] at [place] Interventions -- Intervention Not Indicated -- --  Housing Interventions -- Intervention Not Indicated -- --  Transportation Interventions -- Intervention Not Indicated -- Intervention Not Indicated  Depression Interventions/Treatment  -- -- Currently on Treatment --  Financial Strain Interventions Intervention Not Indicated Intervention Not Indicated -- Intervention Not Indicated  Physical Activity Interventions -- Intervention Not Indicated -- --  Stress Interventions -- Intervention Not Indicated -- --  Social Connections Interventions -- Intervention Not Indicated Intervention Not Indicated --      SDOH Screenings   Food Insecurity: No Food Insecurity (10/17/2021)  Housing: Low Risk  (10/17/2021)  Transportation Needs: No Transportation Needs (10/17/2021)  Alcohol Screen: Low Risk  (10/17/2021)  Depression (PHQ2-9): Low Risk  (10/17/2021)  Financial Resource Strain: Low Risk  (04/05/2022)  Physical Activity: Inactive (10/17/2021)  Social Connections: Moderately Isolated (10/17/2021)  Stress: No Stress Concern Present  (10/17/2021)  Tobacco Use: Low Risk  (03/23/2022)    CCM Care Plan  Allergies  Allergen Reactions   Oxycodone Other (See Comments)    Gives patient nightmares   Rifampin Nausea Only    Medications Reviewed Today     Reviewed by Rockwell Alexandria, CMA (Certified Medical Assistant) on 03/23/22 at 1047  Med List Status: <None>   Medication Order Taking? Sig Documenting Provider Last Dose Status Informant  acetaminophen (TYLENOL) 325 MG tablet [id number] Yes Take 325 mg by mouth 2 (two) times daily as needed for moderate pain or headache. [provider] Taking Active Spouse/Significant Other  ARIPiprazole (ABILIFY) 10 MG tablet [id number] Yes TAKE 1 TABLET BY MOUTH EVERY DAY Burchette, Alinda Sierras, MD Taking Active   aspirin EC 81 MG tablet [id number] Yes Take 81 mg by mouth at bedtime.  [provider] Taking Active Spouse/Significant Other  atorvastatin (LIPITOR) 40 MG tablet [id number] Yes Take 20 mg by mouth daily. [provider] Taking Active   carvedilol (COREG) 6.25 MG tablet [id number] Yes TAKE 1 TABLET BY MOUTH 2 TIMES DAILY WITH A MEAL. NEEDS FOLLOW UP APPOINTMENT FOR MORE REFILLS Bensimhon, Shaune Pascal, MD Taking Active   clonazePAM (KLONOPIN) 1 MG tablet [id number] Yes TAKE 1 TABLET BY MOUTH TWICE A DAY *NOT FOR REGULAR USE* Burchette, Alinda Sierras, MD Taking Active   dapagliflozin propanediol (FARXIGA) 10 MG TABS tablet [id number] Yes Take 1 tablet (10 mg total) by mouth daily. Bensimhon, Shaune Pascal, MD Taking Active   docusate sodium (COLACE) 100 MG capsule [id number] Yes Take 100 mg by mouth 2 (two) times daily. [provider] Taking Active Spouse/Significant Other  [facility]) [id number] Yes Take 237 mLs by mouth daily as needed. [name], MD Taking Active   furosemide (LASIX) 20 MG tablet [id number] Yes TAKE 1 TABLET (20 MG TOTAL) BY MOUTH 4 (FOUR) TIMES A WEEK. Bensimhon, Shaune Pascal, MD Taking Active   levothyroxine (SYNTHROID) 175 MCG tablet  [id number] Yes TAKE 1 TABLET BY MOUTH EVERY DAY BEFORE BREAKFAST Burchette, Alinda Sierras, MD Taking Active   mexiletine (MEXITIL) 150 MG capsule [id number] Yes TAKE 2 CAPSULES BY MOUTH EVERY MORNING AND 2 CAPSULES BY MOUTH EVERY EVENING. Bensimhon, Shaune Pascal, MD Taking Active   ranolazine (RANEXA) 500 MG 12 hr tablet [id number] Yes TAKE 1 TABLET BY MOUTH  TWICE A DAY Deboraha Sprang, MD Taking Active   sacubitril-valsartan [facility]) 97-103 MG [id number] Yes Take 1 tablet by mouth 2 (two) times daily. NEEDS FOLLOW UP APPOINTMENT FOR ANYMORE REFILLS Bensimhon, Shaune Pascal, MD Taking Active   Salicylic Acid (WART REMOVER EX) [id number] Yes Apply 1 application topically daily as needed (foot corns). 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    CKD (chronic kidney disease), stage II    H/O aortic valve repair- 1968 12/07/2013   Non-ischemic cardiomyopathy- EF 20-25% - TTE 11/2013 12/07/2013   Ventricular tachycardia    Elevated troponin- chronic- normal coronaries 11/25/2013   Chronic combined systolic and diastolic CHF (congestive heart failure) ([place]) 10/22/2013   Major depressive disorder, recurrent, severe with psychotic features- hospitalized in Jan 2013 08/20/2011   Generalized anxiety disorder 08/20/2011   AVR- Deneen Harts 2011, re-do March 2015 secondary to valve endocarditis 12/29/2010   Hypertension  12/29/2010   Hypothyroidism 03/05/2009    Immunization History  Administered Date(s) Administered   Fluad Quad(high Dose 65+) 06/15/2021   Influenza Split 08/18/2011   Influenza, Quadrivalent, Recombinant,  Inj, Pf 05/04/2019   Influenza,inj,Quad PF,6+ Mos 09/11/2013, 03/19/2014, 03/21/2017, 05/29/2018, 05/31/2020   PFIZER(Purple Top)SARS-COV-2 Vaccination 09/29/2019, 10/20/2019, 07/02/2020   Pfizer Covid-19 Vaccine Bivalent Booster 45yr & up 04/11/2021   Pneumococcal Conjugate-13 06/08/2014   Td 07/31/2004   Tdap 03/06/2016   Spoke with patient's wife as she manages his medications. She reports patient's BP goes up and down and is usually higher in the mornings and with stress. He presents with red cheeks and his eyes are bigger when he is stressed and then she checks his BP. Patient's wife will wait until after taking his medications to check his BP now.  Patient's wife doesn't think counseling would be helpful as patient thinks he can solve his own problems. She will discuss it with him again as this does run in the family.  Discussed shingles vaccine in detail with patient and recommendations for getting it.   Conditions to be addressed/monitored:  Hypertension, Hyperlipidemia, Heart Failure, Hypothyroidism, Depression, and Anxiety  Conditions addressed this visit: Hypertension, heart failure, hyperlipidemia  Care Plan : CCM Pharmacy Care Plan  Updates made by PViona Gilmore RSocorrosince 04/05/2022 12:00 AM     Problem: Problem: Hypertension, Hyperlipidemia, Heart Failure, Hypothyroidism, Depression, and Anxiety      Long-Range Goal: Patient-Specific Goal   Start Date: 03/15/2021  Expected End Date: 03/15/2022  Recent Progress: On track  Priority: High  Note:   Current Barriers:  Unable to independently monitor therapeutic efficacy Unable to maintain control of hypothyroidism Unable to self administer medications as prescribed  Pharmacist Clinical Goal(s):  Patient will achieve adherence to monitoring guidelines and medication adherence to achieve therapeutic efficacy through collaboration with PharmD and provider.   Interventions: 1:1 collaboration with BEulas Post  MD regarding development and update of comprehensive plan of care as evidenced by provider attestation and co-signature Inter-disciplinary care team collaboration (see longitudinal plan of care) Comprehensive medication review performed; medication list updated in electronic medical record  Hypertension (BP goal <130/80) -Controlled -Current treatment: Carvedilol 6.25 mg 1 tablet twice daily - Appropriate, Query effective, Safe, Accessible -Medications previously tried: unknown  -Current home readings: 131/82, 114/67, 110/65, 113/71, 152/99 (checking every day; can vary with anxiety and first thing in the morning is higher) -Current dietary habits: limits salt some but does not read package labels; does look at package labels - salt intake is minimal and he does eat out often (twice a week) - BP doesn't good; does do some fresh, frozen and canned vegetables; potato chips (low sodium); no frozen meals -Current exercise habits: did not discuss -Denies hypotensive/hypertensive symptoms -Educated on BP goals and benefits of medications for prevention of heart attack, stroke and kidney damage; Exercise goal of 150 minutes per week; Importance of home blood pressure monitoring; Proper BP monitoring technique; -Counseled to monitor BP at home weekly, document, and provide log at future appointments -Counseled on diet and exercise extensively Recommended to continue current medication  Hyperlipidemia: (LDL goal < 70) -Controlled -Current treatment: Atorvastatin 40 mg 1 tablet daily - taking 1/2 tablet Appropriate, Query effective, Safe, Accessible -Medications previously tried: none  -Current dietary patterns: did not discuss -Current exercise habits: did not discuss -Educated on Cholesterol goals;  Importance of limiting foods high in cholesterol; Exercise goal of 150 minutes per week; -Counseled on diet and exercise extensively Recommended to continue current medication Recommended repeat  lipid panel.  Heart Failure (Goal: manage symptoms and prevent exacerbations) -Controlled -Last ejection fraction: 35-40% (Date: 02/14/21) -HF type: Systolic -NYHA Class: II (slight limitation of activity) -AHA HF Stage: C (Heart disease and symptoms present) -Current treatment: Carvedilol 6.25 mg 1 tablet twice daily - Appropriate, Effective, Safe, Accessible Furosemide 20 mg 3 tablets three times a week - Appropriate, Effective, Safe, Accessible Entresto 97-103 mg 1 tablet twice daily - Appropriate, Effective, Safe, Accessible Metolazone 2.5 mg as directed - Appropriate, Effective, Safe, Accessible -Medications previously tried: unknown  -Current home BP/HR readings: refer to above -Current dietary habits: limits salt some but does not read package labels -Current exercise habits: did not discuss -Educated on Benefits of medications for managing symptoms and prolonging life Proper diuretic administration and potassium supplementation Importance of blood pressure control -Counseled on diet and exercise extensively Recommended to continue current medication  Chest pain/arrhythmia (Goal: minimize symptoms) -Controlled -Current treatment  Ranolazine  500 mg 1 tablet twice daily - Appropriate, Effective, Safe, Accessible Mexiletine 150 mg 2 capsules in the morning and 2 capsules in the evening - Appropriate, Effective, Safe, Accessible -Medications previously tried: none  -Recommended to continue current medication  Depression/Anxiety (Goal: minimize symptoms) -Controlled -Current treatment: Sertraline 50 mg 1 tablet daily - Appropriate, Effective, Safe, Accessible Abilify 10 mg 1/2 tablet daily - Appropriate, Effective, Safe, Accessible Clonazepam 1 mg 1 tablet twice daily as needed - Appropriate, Effective, Safe, Accessible -Medications previously tried/failed: could not recall -PHQ9: 10 -GAD7: n/a -Educated on Benefits of medication for symptom control Benefits of  cognitive-behavioral therapy with or without medication -Counseled on diet and exercise extensively Recommended to continue current medication Provided phone number for behavioral health.  Hypothyroidism (Goal: TSH 0.35-4.5) -Uncontrolled -Current treatment  Levothyroxine 175 mcg 1 tablet daily before breakfast - Appropriate, Effective, Safe, Accessible -Medications previously tried: none  -Recommended rechecking TSH and adjusting levothyroxine dose.  Health Maintenance -Vaccine gaps: shingrix, Prevnar, COVID booster -Current therapy:  Aspirin 81 mg 1 tablet daily Docusate 100 mg 1 capsule twice daily -Educated on Cost vs benefit of each product must be carefully  weighed by individual consumer -Patient is satisfied with current therapy and denies issues -Recommended to continue current medication  Patient Goals/Self-Care Activities Patient will:  - take medications as prescribed check blood pressure weekly, document, and provide at future appointments weigh daily, and contact provider if weight gain of > 3 lbs in one day or > 5 lbs in one week target a minimum of 150 minutes of moderate intensity exercise weekly  Follow Up Plan: Telephone follow up appointment with care management team member scheduled for: 6 months      Medication Assistance: None required.  Patient affirms current coverage meets needs.  Compliance/Adherence/Medication fill history: Care Gaps: Shingrix, HIV screening, Prevnar, influenza, COVID booster Last BP - 143/73 on 09/16/2021  Star-Rating Drugs: Atorvastatin 59m - last filled on 10/28/2021 [facility] verified with pharmacist Farxiga 165m- last filled 03/10/2022 30 DS at [facility]  Patient's preferred pharmacy is:  [id number] [place] - [address] [place] [id number]: [phone]: [id number] [place] [address]one: [phone]: [phone] pill box? Yes Pt endorses 97% compliance - once a month  We discussed: Current pharmacy is preferred with insurance plan and patient is satisfied with pharmacy services Patient decided to: Continue current medication management strategy  Care Plan and Follow Up Patient Decision:  Patient agrees to Care Plan and Follow-up.  Plan: Telephone follow up appointment with care management team member scheduled for:  6 months  MadJeni SallesharmD, BCAOrinarmacist [place] [phone]

## 2022-04-07 ENCOUNTER — Other Ambulatory Visit: Payer: Self-pay | Admitting: Internal Medicine

## 2022-04-12 ENCOUNTER — Ambulatory Visit (INDEPENDENT_AMBULATORY_CARE_PROVIDER_SITE_OTHER): Payer: Medicare Other | Admitting: Family Medicine

## 2022-04-12 ENCOUNTER — Encounter: Payer: Self-pay | Admitting: Family Medicine

## 2022-04-12 VITALS — BP 110/70 | HR 60 | Temp 97.8°F | Ht 72.84 in | Wt 194.4 lb

## 2022-04-12 DIAGNOSIS — E039 Hypothyroidism, unspecified: Secondary | ICD-10-CM | POA: Diagnosis not present

## 2022-04-12 DIAGNOSIS — Z0000 Encounter for general adult medical examination without abnormal findings: Secondary | ICD-10-CM | POA: Diagnosis not present

## 2022-04-12 DIAGNOSIS — Z23 Encounter for immunization: Secondary | ICD-10-CM | POA: Diagnosis not present

## 2022-04-12 LAB — CBC WITH DIFFERENTIAL/PLATELET
Basophils Absolute: 0.1 10*3/uL (ref 0.0–0.1)
Basophils Relative: 1 % (ref 0.0–3.0)
Eosinophils Absolute: 0.2 10*3/uL (ref 0.0–0.7)
Eosinophils Relative: 3.8 % (ref 0.0–5.0)
HCT: 49.7 % (ref 39.0–52.0)
Hemoglobin: 16.4 g/dL (ref 13.0–17.0)
Lymphocytes Relative: 22.5 % (ref 12.0–46.0)
Lymphs Abs: 1.4 10*3/uL (ref 0.7–4.0)
MCHC: 33.1 g/dL (ref 30.0–36.0)
MCV: 95.1 fl (ref 78.0–100.0)
Monocytes Absolute: 0.7 10*3/uL (ref 0.1–1.0)
Monocytes Relative: 11.2 % (ref 3.0–12.0)
Neutro Abs: 3.8 10*3/uL (ref 1.4–7.7)
Neutrophils Relative %: 61.5 % (ref 43.0–77.0)
Platelets: 145 10*3/uL — ABNORMAL LOW (ref 150.0–400.0)
RBC: 5.23 Mil/uL (ref 4.22–5.81)
RDW: 14.5 % (ref 11.5–15.5)
WBC: 6.2 10*3/uL (ref 4.0–10.5)

## 2022-04-12 LAB — LIPID PANEL
Cholesterol: 137 mg/dL (ref 0–200)
HDL: 46 mg/dL (ref >39.00)
LDL Cholesterol: 72 mg/dL (ref 0–99)
NonHDL: 91.11
Total CHOL/HDL Ratio: 3
Triglycerides: 97 mg/dL (ref 0.0–149.0)
VLDL: 19.4 mg/dL (ref 0.0–40.0)

## 2022-04-12 LAB — BASIC METABOLIC PANEL
BUN: 17 mg/dL (ref 6–23)
CO2: 28 mEq/L (ref 19–32)
Calcium: 9.7 mg/dL (ref 8.4–10.5)
Chloride: 104 mEq/L (ref 96–112)
Creatinine, Ser: 1.19 mg/dL (ref 0.40–1.50)
GFR: 64.25 mL/min (ref >60.00)
Glucose, Bld: 92 mg/dL (ref 70–99)
Potassium: 5 mEq/L (ref 3.5–5.1)
Sodium: 141 mEq/L (ref 135–145)

## 2022-04-12 LAB — HEPATIC FUNCTION PANEL
ALT: 24 U/L (ref 0–53)
AST: 22 U/L (ref 0–37)
Albumin: 4 g/dL (ref 3.5–5.2)
Alkaline Phosphatase: 55 U/L (ref 39–117)
Bilirubin, Direct: 0.1 mg/dL (ref 0.0–0.3)
Total Bilirubin: 0.6 mg/dL (ref 0.2–1.2)
Total Protein: 6.8 g/dL (ref 6.0–8.3)

## 2022-04-12 LAB — TSH: TSH: 0.25 u[IU]/mL — ABNORMAL LOW (ref 0.35–5.50)

## 2022-04-12 LAB — PSA: PSA: 2.1 ng/mL (ref 0.10–4.00)

## 2022-04-12 NOTE — Addendum Note (Signed)
Addended by: Nilda Riggs on: 04/12/2022 05:02 PM   Modules accepted: Orders

## 2022-04-12 NOTE — Patient Instructions (Signed)
Remember to get Flu vaccine and also consider Shingrix vaccine at pharmacy  Need follow up colonoscopy this December- and contact GI if they don't call you by early December.

## 2022-04-12 NOTE — Progress Notes (Signed)
Established Patient Office Visit  Subjective   Patient ID: Bradley [name], male    DOB: May 31, 1956  Age: 65 y.o. MRN: [id number]  No chief complaint on file.   HPI   Bradley [name] is seen for physical exam.  Accompanied by his wife.  He has history of nonischemic cardiomyopathy with chronic combined systolic and diastolic heart failure, hypertension, hypothyroidism, chronic kidney disease stage III, history of aortic valve replacement, history of recurrent depression.  Currently stable.  He has some ongoing chronic dyspnea with things like climbing stairs but no progression symptoms.  No peripheral edema.  Sometimes eats foods with excessive sodium (eg chinese food)-and notices definite correlation with increase in symptoms with that.  Denies any consistent orthopnea.  Health maintenance reviewed  -Needs flu vaccine but plans to get at pharmacy -No history of Shingrix vaccine -Needs Prevnar 20 -Colonoscopy due 12/23  Family history-'s mother has history of hypertension.  Father had heart disease and hypertension history as well as history of stroke.  He has 3 sisters -two had thyroid cancer.  Past Medical History:  Diagnosis Date   Anxiety    Aortic stenosis    s/p Bentall with bioprosthetic AVR 02/2010; Last echo (9/11): Moderate LVH, EF 45-50%, AVR functioning appropriately, aortic valve mean gradient 21, diastolic dysfunction. Chest MRA (2/13): Mild to moderate dilatation at the sinus of Valsalva at 4.1 cm, mild dilatation ascending aorta distal to the tube graft at 3.9 cm, moderate dilatation of the innominate artery a 2.1 cm;     CHF (congestive heart failure) ([place])    Depression    ESRD on dialysis ([place])    a. 09/2013 felt to be related to gentamycin b. no longer requiring HD   Hx of cardiac cath    a. LHC in 02/2010: normal cors   Hx of echocardiogram 02/2014   Echo (8/15):  Mod LV, EF 35-40%, Gr 1 DD, AVR ok (mean 14 mmHg), mild LAE   Hypertension    Hypothyroidism, postsurgical     Prosthetic valve endocarditis (HCC)    Staphylococcus aureus bacteremia    Thyroid cancer ([place])    Ventricular tachycardia ([place])    a. s/p STJ ICD   Past Surgical History:  Procedure Laterality Date   AORTIC VALVE REPAIR  1968   AORTIC VALVE REPLACEMENT  2011   AV FISTULA PLACEMENT Left 10/08/2013   Procedure: ARTERIOVENOUS (AV) FISTULA CREATION- LEFT ARM; Radial Cephalic ;  Surgeon: Mal [name], MD;  [facility];  Service: Vascular;  Laterality: Left;   BENTALL PROCEDURE N/A 10/28/2013   Procedure: REDO BENTALL PROCEDURE, debridment of aoritc root abscess, replacement of aortic root, ascending aorta and aortic valve with homograft. Insertion of left femoral arterial line;  Surgeon: [name], MD;  Location: [place];  Service: Open Heart Surgery;  Laterality: N/A;   BIOPSY  07/22/2019   Procedure: BIOPSY;  Surgeon: Doran Stabler, MD;  Location: [facility];  Service: Gastroenterology;;   CARDIAC CATHETERIZATION  03/02/2010   NORMAL CORONARY ARTERY   CARDIAC CATHETERIZATION N/A 01/01/2015   Procedure: Coronary/Graft Angiography;  Surgeon: Troy Sine, MD;  Location: [place] [facility];  Service: Cardiovascular;  Laterality: N/A;   CARDIAC VALVE REPLACEMENT     COLONOSCOPY WITH PROPOFOL N/A 07/22/2019   Procedure: COLONOSCOPY WITH PROPOFOL;  Surgeon: Doran Stabler, MD;  [facility];  Service: Gastroenterology;  Laterality: N/A;   EP IMPLANTABLE DEVICE N/A 01/01/2015   Procedure: ICD Implant;  Surgeon: Deboraha Sprang, MD;  Location: [place] [facility];  Service: Cardiovascular;  Laterality: N/A;   HEMOSTASIS CLIP PLACEMENT  07/22/2019   Procedure: HEMOSTASIS CLIP PLACEMENT;  Surgeon: Doran Stabler, MD;  [facility];  Service: Gastroenterology;;   INTRAOPERATIVE TRANSESOPHAGEAL ECHOCARDIOGRAM N/A 10/28/2013   Procedure: INTRAOPERATIVE TRANSESOPHAGEAL ECHOCARDIOGRAM;  Surgeon: [name], MD;  Location: [place];  Service: Open Heart  Surgery;  Laterality: N/A;   PERIPHERAL VASCULAR CATHETERIZATION N/A 01/01/2015   Procedure: Aortic Arch Angiography;  Surgeon: Troy Sine, MD;  Location: [place] [facility];  Service: Cardiovascular;  Laterality: N/A;   POLYPECTOMY  07/22/2019   Procedure: POLYPECTOMY;  Surgeon: Doran Stabler, MD;  [facility];  Service: Gastroenterology;;   SHOULDER ARTHROSCOPY W/ ROTATOR CUFF REPAIR Right 2012   STERNOTOMY     REDO   TEE WITHOUT CARDIOVERSION N/A 10/24/2013   Procedure: TRANSESOPHAGEAL ECHOCARDIOGRAM (TEE);  Surgeon: Dorothy Spark, MD;  Location: [place];  Service: Cardiovascular;  Laterality: N/A;   THYROIDECTOMY  ~ 2005   TRANSTHORACIC ECHOCARDIOGRAM  03/2010   SHOWED MILD REDUCTION OF LV FUNCTION    reports that he has never smoked. He has never used smokeless tobacco. He reports that he does not drink alcohol and does not use drugs. family history includes Heart disease in his father; Hypertension in his father and mother; Kidney Stones in his sister; Stroke in his father; Thyroid cancer in his sister and sister. Allergies  Allergen Reactions   Oxycodone Other (See Comments)    Gives patient nightmares   Rifampin Nausea Only    Review of Systems  Constitutional:  Negative for chills, fever, malaise/fatigue and weight loss.  HENT:  Negative for hearing loss.   Eyes:  Negative for blurred vision and double vision.  Respiratory:  Positive for shortness of breath. Negative for cough, hemoptysis, sputum production and wheezing.   Cardiovascular:  Negative for chest pain, palpitations and leg swelling.  Gastrointestinal:  Negative for abdominal pain, blood in stool, constipation and diarrhea.  Genitourinary:  Negative for dysuria.  Skin:  Negative for rash.  Neurological:  Negative for dizziness, speech change, seizures, loss of consciousness and headaches.  Psychiatric/Behavioral:  Negative for depression.       Objective:     BP 110/70 (BP Location:  Left Arm, Patient Position: Sitting, Cuff Size: Normal)   Pulse 60   Temp 97.8 F (36.6 C) (Oral)   Ht 6' 0.84" (1.85 m)   Wt 194 lb 6.4 oz (88.2 kg)   SpO2 97%   BMI 25.76 kg/m  BP Readings from Last 3 Encounters:  04/12/22 110/70  03/23/22 112/64  10/17/21 120/62   Wt Readings from Last 3 Encounters:  04/12/22 194 lb 6.4 oz (88.2 kg)  03/23/22 194 lb 6.4 oz (88.2 kg)  10/17/21 200 lb 9.6 oz (91 kg)      Physical Exam Vitals reviewed.  Constitutional:      General: He is not in acute distress.    Appearance: He is well-developed.  HENT:     Head: Normocephalic and atraumatic.     Right Ear: External ear normal.     Left Ear: External ear normal.     Mouth/Throat:     Mouth: Mucous membranes are moist.     Pharynx: Oropharynx is clear.  Eyes:     Conjunctiva/sclera: Conjunctivae normal.     Pupils: Pupils are equal, round, and reactive to light.  Neck:  Thyroid: No thyromegaly.  Cardiovascular:     Rate and Rhythm: Normal rate and regular rhythm.     Heart sounds: Normal heart sounds. No murmur heard. Pulmonary:     Effort: No respiratory distress.     Breath sounds: No wheezing or rales.  Abdominal:     General: Bowel sounds are normal. There is no distension.     Palpations: Abdomen is soft. There is no mass.     Tenderness: There is no abdominal tenderness. There is no guarding or rebound.  Musculoskeletal:     Cervical back: Normal range of motion and neck supple.     Right lower leg: No edema.     Left lower leg: No edema.  Lymphadenopathy:     Cervical: No cervical adenopathy.  Skin:    Findings: No rash.  Neurological:     Mental Status: He is alert and oriented to person, place, and time.     Cranial Nerves: No cranial nerve deficit.      No results found for any visits on 04/12/22.  Last CBC Lab Results  Component Value Date   WBC 6.9 09/16/2021   HGB 17.2 (H) 09/16/2021   HCT 52.6 (H) 09/16/2021   MCV 94.6 09/16/2021   MCH 30.9  09/16/2021   RDW 14.0 09/16/2021   PLT 150 25/36/6440   Last metabolic panel Lab Results  Component Value Date   GLUCOSE 137 (H) 03/23/2022   NA 141 03/23/2022   K 4.7 03/23/2022   CL 111 03/23/2022   CO2 23 03/23/2022   BUN 17 03/23/2022   CREATININE 1.34 (H) 03/23/2022   GFRNONAA 59 (L) 03/23/2022   CALCIUM 9.3 03/23/2022   PHOS 4.8 (H) 11/28/2013   PROT 7.1 02/14/2021   ALBUMIN 4.0 02/14/2021   BILITOT 0.7 02/14/2021   ALKPHOS 57 02/14/2021   AST 25 02/14/2021   ALT 33 02/14/2021   ANIONGAP 7 03/23/2022   Last lipids Lab Results  Component Value Date   CHOL 136 05/31/2020   HDL 52 05/31/2020   LDLCALC 68 05/31/2020   TRIG 78 05/31/2020   CHOLHDL 2.6 05/31/2020   Last thyroid functions Lab Results  Component Value Date   TSH 4.660 (H) 10/25/2020   T3TOTAL 29.1 (L) 09/14/2013      The ASCVD Risk score (Arnett DK, et al., 2019) failed to calculate for the following reasons:   The patient has a prior MI or stroke diagnosis    Assessment & Plan:   Problem List Items Addressed This Visit   None Visit Diagnoses     Physical exam    -  Primary   Relevant Orders   Basic metabolic panel   CBC with Differential/Platelet   Lipid panel   TSH   Hepatic function panel   PSA     Patient has multiple chronic problems as above.  We discussed the following health maintenance issues  -Recommend flu vaccine and he prefers to wait and get next week when his wife gets hers at their local pharmacy  -Prevnar 20 given  -Discussed Shingrix vaccine and he will check on insurance coverage  -Due for repeat colonoscopy 12/23.  They will contact GI or let me know if they have not heard from GI by early December  -Obtain screening labs as above  No follow-ups on file.    [name], MD

## 2022-04-13 MED ORDER — LEVOTHYROXINE SODIUM 150 MCG PO TABS: 150.0000 ug | ORAL_TABLET | Freq: Every day | ORAL | 3 refills | Status: DC

## 2022-04-13 NOTE — Addendum Note (Signed)
Addended by: Nilda Riggs on: 04/13/2022 11:31 AM   Modules accepted: Orders

## 2022-04-17 ENCOUNTER — Ambulatory Visit (INDEPENDENT_AMBULATORY_CARE_PROVIDER_SITE_OTHER): Payer: Medicare Other

## 2022-04-17 DIAGNOSIS — I5042 Chronic combined systolic (congestive) and diastolic (congestive) heart failure: Secondary | ICD-10-CM

## 2022-04-17 DIAGNOSIS — Z95810 Presence of automatic (implantable) cardiac defibrillator: Secondary | ICD-10-CM | POA: Diagnosis not present

## 2022-04-17 NOTE — Progress Notes (Signed)
EPIC Encounter for ICM Monitoring  Patient Name: Bradley [name] is a 66 y.o. male Date: 04/17/2022 Primary Care Physican: [name], MD Heart Failure Clinic: [facility] Electrophysiologist: Caryl Comes Nephrologist:  [place] 04/17/2022 Office Weight: 194 lbs.  (Does not weigh at home)                                                Spoke with wife per DRP and heart failure questions reviewed.  Pt has not voiced any fluid symptoms.  Pt does not follow low salt diet and eats a lot of processed snacks and restaurant foods.   He has been taking Lasix 3 days a week instead of 4 days a week.   CorVue thoracic impedance suggesting possible fluid accumulation starting 9/11.    Prescribed:  Furosemide 20 mg Take 1 tablet (20 mg total) by mouth 4 (four) times a week.   Reports on 04/17/22 Patient takes Furosemide 3 days (Monday, Thursday & Saturday) a week instead of 4 days a week. Farxiga 10 mg take 1 tablet by mouth daily    Labs: 02/14/2021 Creatinine 1.26, BUN 20, Potassium 5.2, Sodium 139, GFR >60 10/25/2020 Creatinine 1.27, BUN 24, Potassium 5.2, Sodium 139, GFR >60 10/16/2020 Creatinine 1.45, BUN 22, Potassium 5.2, Sodium 137, GFR 51-59, BNP 4978.0 Care Everywhere A complete set of results can be found in Results Review.   Recommendations:  Advised to take Lasix 4 days this week and limit salt intake to 2000 mg daily.     Follow-up plan: ICM clinic phone appointment on 04/24/2022 to recheck fluid levels.   91 day device clinic remote transmission 06/08/2022.     EP/Cardiology Office Visits:  Next HF clinic appt due 05/2022 with Echo (no recall and last appt 03/23/2022).  05/02/2022 with Dr Caryl Comes.     Copy of ICM check sent to Dr. Caryl Comes and Dr Haroldine Laws for review and any further recommendations if needed.   3 month ICM trend: 04/17/2022.    12-14 Month ICM trend:     [name], RN 04/17/2022 7:44 AM

## 2022-04-24 ENCOUNTER — Ambulatory Visit (INDEPENDENT_AMBULATORY_CARE_PROVIDER_SITE_OTHER): Payer: Medicare Other

## 2022-04-24 DIAGNOSIS — Z95810 Presence of automatic (implantable) cardiac defibrillator: Secondary | ICD-10-CM

## 2022-04-24 DIAGNOSIS — I5042 Chronic combined systolic (congestive) and diastolic (congestive) heart failure: Secondary | ICD-10-CM

## 2022-04-24 NOTE — Progress Notes (Unsigned)
EPIC Encounter for ICM Monitoring  Patient Name: Bradley [name] is a 66 y.o. male Date: 04/24/2022 Primary Care Physican: [name], MD Heart Failure Clinic: [facility] Electrophysiologist: Caryl Comes Nephrologist:  [place] 04/17/2022 Office Weight: 194 lbs.  (Does not weigh at home)                                                Spoke with wife and heart failure questions reviewed.  Transmission results reviewed.  Pt asymptomatic for fluid accumulation.  Reports feeling well at this time and voices no complaints.     CorVue thoracic impedance suggesting fluid levels returned to normal after recommendation to take Furosemide 4 days a week as prescribed.    Prescribed:  Furosemide 20 mg Take 1 tablet (20 mg total) by mouth 4 (four) times a week.   Reports on 04/17/22 Patient takes Furosemide 3 days (Monday, Thursday & Saturday) a week instead of 4 days a week. Farxiga 10 mg take 1 tablet by mouth daily    Labs: 02/14/2021 Creatinine 1.26, BUN 20, Potassium 5.2, Sodium 139, GFR >60 10/25/2020 Creatinine 1.27, BUN 24, Potassium 5.2, Sodium 139, GFR >60 10/16/2020 Creatinine 1.45, BUN 22, Potassium 5.2, Sodium 137, GFR 51-59, BNP 4978.0 Care Everywhere A complete set of results can be found in Results Review.   Recommendations:  Advised to take Furosemide as prescribed 4 times a week.  No changes and encouraged to call if experiencing any fluid symptoms.   Follow-up plan: ICM clinic phone appointment on 05/22/2022.   91 day device clinic remote transmission 06/08/2022.     EP/Cardiology Office Visits:  Next HF clinic appt due 05/2022 with Echo (no recall and last appt 03/23/2022).  05/02/2022 with Dr Caryl Comes.     Copy of ICM check sent to Dr. Caryl Comes.  3 month ICM trend: 04/24/2022.    12-14 Month ICM trend:     [name], RN 04/24/2022 8:10 AM

## 2022-04-29 DIAGNOSIS — E039 Hypothyroidism, unspecified: Secondary | ICD-10-CM | POA: Diagnosis not present

## 2022-04-29 DIAGNOSIS — I110 Hypertensive heart disease with heart failure: Secondary | ICD-10-CM | POA: Diagnosis not present

## 2022-04-29 DIAGNOSIS — E785 Hyperlipidemia, unspecified: Secondary | ICD-10-CM | POA: Diagnosis not present

## 2022-04-29 DIAGNOSIS — F339 Major depressive disorder, recurrent, unspecified: Secondary | ICD-10-CM | POA: Diagnosis not present

## 2022-04-29 DIAGNOSIS — I5020 Unspecified systolic (congestive) heart failure: Secondary | ICD-10-CM | POA: Diagnosis not present

## 2022-05-02 ENCOUNTER — Ambulatory Visit: Payer: Medicare Other | Admitting: Internal Medicine

## 2022-05-02 DIAGNOSIS — I5022 Chronic systolic (congestive) heart failure: Secondary | ICD-10-CM | POA: Insufficient documentation

## 2022-05-02 NOTE — Progress Notes (Deleted)
Patient Care Team: [name], MD as PCP - Desma Paganini, MD as PCP - Cardiology (Cardiology) Deboraha Sprang, MD as PCP - Electrophysiology (Cardiology) Bensimhon, Shaune Pascal, MD as PCP - Advanced Heart Failure (Cardiology) [name], MD (Inactive) as Consulting Physician (Cardiothoracic Surgery) Deterding, Jeneen Rinks, MD as Consulting Physician (Nephrology) Deboraha Sprang, MD as Consulting Physician (Cardiology) Viona Gilmore, [facility] as Pharmacist (Pharmacist)   HPI  Bradley [name] is a 66 y.o. male Seen in follow-up for implantable defibrillator St Jude  for sustained ventricular tachycardia 5/16 occurring with a frequency that required adjunctive medical therapy. He ended up on amiodarone but then switched to mex and ranolazine.   A balloon valvotomy at age 79 for presumed congenital aortic stenosis   Bentall procedure in 2001 with a pericardial tissue valve.  He had MSSA bacteremia and bacterial endocarditis in 12/7122 complicated by renal failure requiring dialysis for a short period of time.  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  There have been no palpitations, lightheadedness or syncope.  Complains of some diaphoresis.  No post shower pruritus.  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   History of appropriate therapy: yes - ATP Antiarrhythmics Date Reason stopped  Amiodarone  decision   Mexiletine  2016 ongoing  Ranolazine 2016  ongoing   Date Cr K Dig TSH Hgb  10/19 1.08 4.5  2.57    11/20 1.12 4.7  1.75   7/22 1.26 4.9   17.9   DATE TEST EF   10/14 Echo  60-65 %   6/16 Echo   30-35 %   5/18 Echo  25-30%   7/19 Echo  30-35%   7/22 Echo  35-40%          Past Medical History:  Diagnosis Date   Anxiety    Aortic stenosis    s/p Bentall with bioprosthetic AVR 02/2010; Last echo (9/11): Moderate LVH, EF 45-50%, AVR functioning appropriately, aortic valve mean gradient 21, diastolic dysfunction. Chest MRA (2/13): Mild to moderate dilatation at the sinus of Valsalva at 4.1 cm, mild dilatation ascending aorta distal to the tube graft at 3.9 cm, moderate dilatation of the innominate artery a 2.1 cm;     CHF (congestive heart failure) ([place])    Depression    ESRD on dialysis ([place])    a. 09/2013 felt to be related to gentamycin b. no longer requiring HD   Hx of cardiac cath    a. LHC in 02/2010: normal cors   Hx of echocardiogram 02/2014   Echo (8/15):  Mod LV, EF 35-40%, Gr 1 DD, AVR ok (mean 14 mmHg), mild LAE   Hypertension    Hypothyroidism, postsurgical    Prosthetic valve endocarditis (HCC)    Staphylococcus aureus bacteremia    Thyroid cancer ([place])    Ventricular tachycardia ([place])    a. s/p STJ ICD    Past Surgical History:  Procedure Laterality Date   AORTIC VALVE REPAIR  1968   AORTIC VALVE REPLACEMENT  2011   AV FISTULA PLACEMENT Left 10/08/2013   Procedure: ARTERIOVENOUS (AV) FISTULA CREATION- LEFT ARM; Radial Cephalic ;  Surgeon: Mal [name],  MD;  [facility];  Service: Vascular;  Laterality: Left;   BENTALL PROCEDURE N/A 10/28/2013   Procedure: REDO BENTALL PROCEDURE, debridment of aoritc root abscess, replacement of aortic root, ascending aorta and aortic valve with homograft. Insertion of left femoral arterial line;  Surgeon: [name], MD;  Location: [place];  Service: Open Heart Surgery;  Laterality: N/A;   BIOPSY  07/22/2019   Procedure: BIOPSY;  Surgeon: Doran Stabler, MD;  Location: [facility];  Service: Gastroenterology;;   CARDIAC CATHETERIZATION  03/02/2010   NORMAL CORONARY ARTERY   CARDIAC CATHETERIZATION N/A 01/01/2015    Procedure: Coronary/Graft Angiography;  Surgeon: Troy Sine, MD;  Location: [place] [facility];  Service: Cardiovascular;  Laterality: N/A;   CARDIAC VALVE REPLACEMENT     COLONOSCOPY WITH PROPOFOL N/A 07/22/2019   Procedure: COLONOSCOPY WITH PROPOFOL;  Surgeon: Doran Stabler, MD;  [facility];  Service: Gastroenterology;  Laterality: N/A;   EP IMPLANTABLE DEVICE N/A 01/01/2015   Procedure: ICD Implant;  Surgeon: Deboraha Sprang, MD;  Location: [place] [facility];  Service: Cardiovascular;  Laterality: N/A;   HEMOSTASIS CLIP PLACEMENT  07/22/2019   Procedure: HEMOSTASIS CLIP PLACEMENT;  Surgeon: Doran Stabler, MD;  [facility];  Service: Gastroenterology;;   INTRAOPERATIVE TRANSESOPHAGEAL ECHOCARDIOGRAM N/A 10/28/2013   Procedure: INTRAOPERATIVE TRANSESOPHAGEAL ECHOCARDIOGRAM;  Surgeon: [name], MD;  Location: [place];  Service: Open Heart Surgery;  Laterality: N/A;   PERIPHERAL VASCULAR CATHETERIZATION N/A 01/01/2015   Procedure: Aortic Arch Angiography;  Surgeon: Troy Sine, MD;  Location: [place] [facility];  Service: Cardiovascular;  Laterality: N/A;   POLYPECTOMY  07/22/2019   Procedure: POLYPECTOMY;  Surgeon: Doran Stabler, MD;  [facility];  Service: Gastroenterology;;   SHOULDER ARTHROSCOPY W/ ROTATOR CUFF REPAIR Right 2012   STERNOTOMY     REDO   TEE WITHOUT CARDIOVERSION N/A 10/24/2013   Procedure: TRANSESOPHAGEAL ECHOCARDIOGRAM (TEE);  Surgeon: Dorothy Spark, MD;  Location: [place] [facility] [place];  Service: Cardiovascular;  Laterality: N/A;   THYROIDECTOMY  ~ 2005   TRANSTHORACIC ECHOCARDIOGRAM  03/2010   SHOWED MILD REDUCTION OF LV FUNCTION    Current Outpatient Medications  Medication Sig Dispense Refill   acetaminophen (TYLENOL) 325 MG tablet Take 325 mg by mouth 2 (two) times daily as needed for moderate pain or headache.     ARIPiprazole (ABILIFY) 10 MG tablet TAKE 1 TABLET BY MOUTH EVERY DAY 90 tablet 3   aspirin EC 81  MG tablet Take 81 mg by mouth at bedtime.      atorvastatin (LIPITOR) 40 MG tablet Take 20 mg by mouth daily.     carvedilol (COREG) 6.25 MG tablet TAKE 1 TABLET BY MOUTH 2 TIMES DAILY WITH A MEAL. NEEDS FOLLOW UP APPOINTMENT FOR MORE REFILLS 180 tablet 0   clonazePAM (KLONOPIN) 1 MG tablet TAKE 1 TABLET BY MOUTH TWICE A DAY *NOT FOR REGULAR USE* 30 tablet 0   docusate sodium (COLACE) 100 MG capsule Take 100 mg by mouth 2 (two) times daily.     Ensure (ENSURE) Take 237 mLs by mouth daily as needed. 237 mL 12   FARXIGA 10 MG TABS tablet TAKE 1 TABLET BY MOUTH EVERY DAY 30 tablet 11   furosemide (LASIX) 20 MG tablet TAKE 1 TABLET (20 MG TOTAL) BY MOUTH 4 (FOUR) TIMES A WEEK. 48 tablet 3   levothyroxine (SYNTHROID) 150 MCG tablet Take 1 tablet (150 mcg total) by mouth daily. [place]  tablet 3   mexiletine (MEXITIL) 150 MG capsule TAKE 2 CAPSULES BY MOUTH EVERY MORNING AND 2 CAPSULES BY MOUTH EVERY EVENING. 360 capsule 3   ranolazine (RANEXA) 500 MG 12 hr tablet TAKE 1 TABLET BY MOUTH TWICE A DAY 180 tablet 0   sacubitril-valsartan (ENTRESTO) 97-103 MG Take 1 tablet by mouth 2 (two) times daily. NEEDS FOLLOW UP APPOINTMENT FOR ANYMORE REFILLS 543 tablet 1   Salicylic Acid (WART REMOVER EX) Apply 1 application topically daily as needed (foot corns).     sertraline (ZOLOFT) 50 MG tablet TAKE 1 TABLET BY MOUTH EVERY DAY 90 tablet 2   No current facility-administered medications for this visit.    Allergies  Allergen Reactions   Oxycodone Other (See Comments)    Gives patient nightmares   Rifampin Nausea Only      Review of Systems negative except from HPI and PMH  Physical Exam There were no vitals taken for this visit. Well developed and well nourished in no acute distress HENT normal Neck supple with JVP-flat Clear Device pocket well healed; without hematoma or erythema.  There is no tethering  Regular rate and rhythm, no *** gallop No ***/*** murmur Abd-soft with active BS No Clubbing  cyanosis *** edema Skin-warm and dry A & Oriented  Grossly normal sensory and motor function  ECG ***   Assessment and  Plan  Aortic Valve Disease with valvotomy, Bentall 2011 with redo Bentall 2015  Ventricular tachycarda  ICD St Jude  The patient's device was interrogated.  The information was reviewed. No changes were made in the programming.      NICM  CHF chronic systolic  Polycythemia  No interval ventricular tachycardia.  Continue mexiletine 300 twice daily and ranolazine 500 twice daily  He is euvolemic.  We will continue Lasix 60 mg 3 times a week.  For his cardiomyopathy, continue carvedilol 6.25 twice daily Entresto 97/103.  With his ejection fraction better we will hold off on spironolactone  He is noted to have polycythemia.  This is new since 11/20 and has been confirmed at least on one blood draw.  I have reached out to hematology to ask if the next steps.  His oxygen saturation is normal at rest and with modest exertion.  We will repeat his CBC today and a serum erythropoietin level  Hematology responded that consultation is appropriate.  We will arrange

## 2022-05-23 NOTE — Progress Notes (Signed)
No ICM remote transmission received for 05/22/2022 and next ICM transmission scheduled for 06/12/2022.

## 2022-06-01 ENCOUNTER — Other Ambulatory Visit: Payer: Self-pay | Admitting: Family Medicine

## 2022-06-08 ENCOUNTER — Ambulatory Visit (INDEPENDENT_AMBULATORY_CARE_PROVIDER_SITE_OTHER): Payer: Medicare Other

## 2022-06-08 DIAGNOSIS — I428 Other cardiomyopathies: Secondary | ICD-10-CM | POA: Diagnosis not present

## 2022-06-08 LAB — CUP PACEART REMOTE DEVICE CHECK
Battery Remaining Longevity: 23 mo
Battery Remaining Percentage: 22 %
Battery Voltage: 2.78 V
Brady Statistic RV Percent Paced: 1 %
Date Time Interrogation Session: 20231109020017
HighPow Impedance: 71 Ohm
HighPow Impedance: 71 Ohm
Implantable Lead Connection Status: 753985
Implantable Lead Implant Date: 20160603
Implantable Lead Location: 753860
Implantable Lead Model: 7122
Implantable Pulse Generator Implant Date: 20160603
Lead Channel Impedance Value: 410 Ohm
Lead Channel Pacing Threshold Amplitude: 1.25 V
Lead Channel Pacing Threshold Pulse Width: 0.5 ms
Lead Channel Sensing Intrinsic Amplitude: 11.7 mV
Lead Channel Setting Pacing Amplitude: 2.5 V
Lead Channel Setting Pacing Pulse Width: 0.5 ms
Lead Channel Setting Sensing Sensitivity: 0.5 mV
Pulse Gen Serial Number: 7135169

## 2022-06-12 ENCOUNTER — Ambulatory Visit (INDEPENDENT_AMBULATORY_CARE_PROVIDER_SITE_OTHER): Payer: Medicare Other

## 2022-06-12 DIAGNOSIS — Z95810 Presence of automatic (implantable) cardiac defibrillator: Secondary | ICD-10-CM | POA: Diagnosis not present

## 2022-06-12 DIAGNOSIS — I5042 Chronic combined systolic (congestive) and diastolic (congestive) heart failure: Secondary | ICD-10-CM

## 2022-06-13 ENCOUNTER — Telehealth: Payer: Self-pay

## 2022-06-13 ENCOUNTER — Other Ambulatory Visit: Payer: Medicare Other

## 2022-06-13 DIAGNOSIS — E039 Hypothyroidism, unspecified: Secondary | ICD-10-CM

## 2022-06-13 LAB — TSH: TSH: 1.83 u[IU]/mL (ref 0.35–5.50)

## 2022-06-13 NOTE — Telephone Encounter (Signed)
Remote ICM transmission received.  Attempted call to patient regarding ICM remote transmission and left detailed message per DPR.  Advised to return call for any fluid symptoms or questions.  

## 2022-06-13 NOTE — Progress Notes (Signed)
EPIC Encounter for ICM Monitoring  Patient Name: Bradley [name] is a 66 y.o. male Date: 06/13/2022 Primary Care Physican: [name], MD Heart Failure Clinic: [facility] Electrophysiologist: Caryl Comes Nephrologist:  [place] 04/17/2022 Office Weight: 194 lbs.  (Does not weigh at home)                                                Attempted call to patient and unable to reach.  Left detailed message per DPR regarding transmission. Transmission reviewed.    CorVue thoracic impedance suggesting possible fluid accumulation from 11/5-11/12 and returned to normal 1/13.  Also no thoracic recording from 9/28-10/22.  Spoke Apolonio Schneiders at [facility] regarding no thoracic recording from 9/28-10/22.  She reports typically a fast ventricular beat may cause the temporary impedance suspension.  This may cause a reset of 12- 14 day before it will begin recording again.  She said the lower VT settings may not have picked up the episode since none are showing in the diagnostics.   Prescribed:  Furosemide 20 mg Take 1 tablet (20 mg total) by mouth 4 (four) times a week.   Reports on 04/17/22 Patient takes Furosemide 3 days (Monday, Thursday & Saturday) a week instead of 4 days a week. Farxiga 10 mg take 1 tablet by mouth daily    Labs: 04/12/2022 Creatinine 1.19, BUN 17, Potassium 5.0, Sodium 141, GFR 64.25 03/23/2022 Creatinine 1.34, BUN 17, Potassium 4.7, Sodium 141, GFR 59  A complete set of results can be found in Results Review.   Recommendations: Left voice mail with ICM number and encouraged to call if experiencing any fluid symptoms.   Follow-up plan: ICM clinic phone appointment on 07/17/2022.   91 day device clinic remote transmission 06/08/2022.     EP/Cardiology Office Visits:  06/28/2022 with Dr Haroldine Laws.  07/11/2022 with Dr Caryl Comes.     Copy of ICM check sent to Dr. Caryl Comes.   3 month ICM trend: 06/12/2022.    12-14 Month ICM trend:     [name], RN 06/13/2022 1:32  PM

## 2022-06-15 NOTE — Progress Notes (Signed)
Remote ICD transmission.   

## 2022-06-18 ENCOUNTER — Other Ambulatory Visit (HOSPITAL_COMMUNITY): Payer: Self-pay | Admitting: Internal Medicine

## 2022-06-19 ENCOUNTER — Telehealth: Payer: Self-pay | Admitting: *Deleted

## 2022-06-19 NOTE — Patient Outreach (Signed)
  Care Coordination   06/19/2022 Name: Bradley [name] MRN: [id number] DOB: 17-Oct-1955   Care Coordination Outreach Attempts:  An unsuccessful telephone outreach was attempted today to offer the patient information about available care coordination services as a benefit of their health plan.   Follow Up Plan:  Additional outreach attempts will be made to offer the patient care coordination information and services.   Encounter Outcome:  No Answer  Care Coordination Interventions Activated:  No   Care Coordination Interventions:  No, not indicated    Raina Mina, RN Care Management Coordinator [place] Office [phone]

## 2022-06-25 ENCOUNTER — Other Ambulatory Visit: Payer: Self-pay | Admitting: Family Medicine

## 2022-06-28 ENCOUNTER — Ambulatory Visit (HOSPITAL_BASED_OUTPATIENT_CLINIC_OR_DEPARTMENT_OTHER)
Admission: RE | Admit: 2022-06-28 | Discharge: 2022-06-28 | Disposition: A | Discharge status: Home / Self Care | Payer: Medicare Other | Source: Ambulatory Visit | Attending: Internal Medicine | Admitting: Internal Medicine

## 2022-06-28 ENCOUNTER — Encounter (HOSPITAL_COMMUNITY): Payer: Self-pay | Admitting: Internal Medicine

## 2022-06-28 ENCOUNTER — Ambulatory Visit (HOSPITAL_COMMUNITY)
Admission: RE | Admit: 2022-06-28 | Discharge: 2022-06-28 | Disposition: A | Discharge status: Home / Self Care | Payer: Medicare Other | Source: Ambulatory Visit | Attending: Internal Medicine | Admitting: Internal Medicine

## 2022-06-28 VITALS — BP 110/70 | HR 60 | Wt 197.6 lb

## 2022-06-28 DIAGNOSIS — Z95810 Presence of automatic (implantable) cardiac defibrillator: Secondary | ICD-10-CM | POA: Diagnosis not present

## 2022-06-28 DIAGNOSIS — I4729 Other ventricular tachycardia: Secondary | ICD-10-CM | POA: Diagnosis not present

## 2022-06-28 DIAGNOSIS — Z85850 Personal history of malignant neoplasm of thyroid: Secondary | ICD-10-CM | POA: Diagnosis not present

## 2022-06-28 DIAGNOSIS — I132 Hypertensive heart and chronic kidney disease with heart failure and with stage 5 chronic kidney disease, or end stage renal disease: Secondary | ICD-10-CM | POA: Diagnosis not present

## 2022-06-28 DIAGNOSIS — Z8249 Family history of ischemic heart disease and other diseases of the circulatory system: Secondary | ICD-10-CM | POA: Diagnosis not present

## 2022-06-28 DIAGNOSIS — I5042 Chronic combined systolic (congestive) and diastolic (congestive) heart failure: Secondary | ICD-10-CM

## 2022-06-28 DIAGNOSIS — N186 End stage renal disease: Secondary | ICD-10-CM | POA: Insufficient documentation

## 2022-06-28 DIAGNOSIS — I5022 Chronic systolic (congestive) heart failure: Secondary | ICD-10-CM

## 2022-06-28 DIAGNOSIS — I428 Other cardiomyopathies: Secondary | ICD-10-CM | POA: Diagnosis not present

## 2022-06-28 DIAGNOSIS — Z79899 Other long term (current) drug therapy: Secondary | ICD-10-CM | POA: Diagnosis not present

## 2022-06-28 DIAGNOSIS — I359 Nonrheumatic aortic valve disorder, unspecified: Secondary | ICD-10-CM | POA: Diagnosis not present

## 2022-06-28 DIAGNOSIS — F32A Depression, unspecified: Secondary | ICD-10-CM | POA: Diagnosis not present

## 2022-06-28 DIAGNOSIS — I447 Left bundle-branch block, unspecified: Secondary | ICD-10-CM | POA: Insufficient documentation

## 2022-06-28 DIAGNOSIS — I472 Ventricular tachycardia, unspecified: Secondary | ICD-10-CM

## 2022-06-28 LAB — BASIC METABOLIC PANEL
Anion gap: 7 (ref 5–15)
BUN: 19 mg/dL (ref 8–23)
CO2: 23 mmol/L (ref 22–32)
Calcium: 9 mg/dL (ref 8.9–10.3)
Chloride: 108 mmol/L (ref 98–111)
Creatinine, Ser: 1.25 mg/dL — ABNORMAL HIGH (ref 0.61–1.24)
GFR, Estimated: >60 mL/min (ref >60)
Glucose, Bld: 135 mg/dL — ABNORMAL HIGH (ref 70–99)
Potassium: 3.9 mmol/L (ref 3.5–5.1)
Sodium: 138 mmol/L (ref 135–145)

## 2022-06-28 LAB — ECHOCARDIOGRAM COMPLETE
AR max vel: 3.59 cm2
AV Area VTI: 3.86 cm2
AV Area mean vel: 3.71 cm2
AV Mean grad: 4 mmHg
AV Peak grad: 6.8 mmHg
Ao pk vel: 1.3 m/s
Area-P 1/2: 1.19 cm2
Calc EF: 27.2 %
S' Lateral: 4.5 cm
Single Plane A2C EF: 27.7 %
Single Plane A4C EF: 30.5 %

## 2022-06-28 LAB — BRAIN NATRIURETIC PEPTIDE: B Natriuretic Peptide: 278.9 pg/mL — ABNORMAL HIGH (ref 0.0–100.0)

## 2022-06-28 MED ORDER — PERFLUTREN LIPID MICROSPHERE
1.0000 mL | INTRAVENOUS | Status: DC | PRN
  Administered 2022-06-28: 6 mL via INTRAVENOUS

## 2022-06-28 NOTE — Progress Notes (Signed)
Heart Failure Clinic Note  Date:  06/28/2022   ID:  Bradley [name], DOB 12-19-1955, MRN [id number]  Location: Home  Provider location: [facility] Type of Visit: Established patient  PCP:  [name], MD  Cardiologist:  Dorris Carnes, MD Primary HF: Bradley [name]  Chief Complaint: Heart Failure follow-up   History of Present Illness:  Bradley [name] is a 66 y.o. y/o male with a complicated past medical history including thyroid cancer (s/p thyroidectomy), hypertension, depression, and congenital aortic stenosis for which he underwent aortic vavulotomy at age 45 with subsequent Bentall procedure in 2001 with a pericardial tissue valve.  He had MSSA bacteremia and bacterial endocarditis in 03/2329 complicated by renal failure requiring dialysis for a short period of time.  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     He was admitted 11/2014 with progressive episodes of palpitations and pre-syncope. He was found to have monomorphic ventricular tachycardia. Catheterization demonstrated normal coronaries.  Echocardiogram demonstrated EF 30-35% and he underwent STJ dual chamber ICD implant. \  Echo 9/21 EF 35-40%  [facility] on 10/16/20 with syncope and nausea. Had ICD check. No VT. But fluid up. Increased lasix to 60 daily for several days (was on '60mg'$  MWF) with return to baseline.    Today he returns for HF follow up with his wife. Overall stable.  Taking care of the animals. Just has to take his time. SOB if he goes up steps. No CP, edema, orthopnea or PND. Follows with Sharman Cheek. Was volume overloaded in October but now very dry on Corevue    Corvue: No VT/AF volume dry Personally reviewed   Studies: Echo Ef 35-40%  Echo 07/2019 30-35%  Echo 7/19  30-35% AVR stable. Personally reviewed Echo 5/18 EF  25-30%    CPX 01/01/17  FEV1 3.40 (86%)        FEV1/FVC 83 (107%)        MVV 163 (108%) Resting HR: 72 Peak HR: 129   (80% age predicted max HR) BP rest: 118/70 BP peak: 174/84  Peak VO2: 21.1 (73% predicted peak VO2) VE/VCO2 slope:  35 OUES: 1.96 Peak RER:  1.22 Ventilatory Threshold: 16.6 (58% predicted or measured peak VO2) VE/MVV:  50% O2pulse:  15   (100% predicted O2pulse)     Past Medical History:  Diagnosis Date   Anxiety    Aortic stenosis    s/p Bentall with bioprosthetic AVR 02/2010; Last echo (9/11): Moderate LVH, EF 45-50%, AVR functioning appropriately, aortic valve mean gradient 21, diastolic dysfunction. Chest MRA (2/13): Mild to moderate dilatation at the sinus of Valsalva at 4.1 cm, mild dilatation ascending aorta distal to the tube graft at 3.9 cm, moderate dilatation of the innominate artery a 2.1 cm;     CHF (congestive heart failure) ([place])    Depression    ESRD on dialysis ([place])    a. 09/2013 felt to be related to gentamycin b. no longer requiring HD   Hx of cardiac cath    a. LHC in 02/2010: normal cors   Hx of echocardiogram 02/2014   Echo (8/15):  Mod LV, EF 35-40%, Gr 1 DD, AVR ok (mean 14 mmHg), mild LAE   Hypertension    Hypothyroidism, postsurgical    Prosthetic valve endocarditis (HCC)    Staphylococcus aureus bacteremia    Thyroid cancer ([place])  Ventricular tachycardia ([place])    a. s/p STJ ICD   Past Surgical History:  Procedure Laterality Date   AORTIC VALVE REPAIR  1968   AORTIC VALVE REPLACEMENT  2011   AV FISTULA PLACEMENT Left 10/08/2013   Procedure: ARTERIOVENOUS (AV) FISTULA CREATION- LEFT ARM; Radial Cephalic ;  Surgeon: Mal [name], MD;  [facility]: [facility];  Service: Vascular;  Laterality: Left;   BENTALL PROCEDURE N/A 10/28/2013   Procedure: REDO BENTALL PROCEDURE, debridment of aoritc root abscess, replacement of aortic root, ascending aorta and aortic valve with homograft. Insertion of left femoral arterial line;  Surgeon: [name], MD;  Location: [place];  Service: Open Heart Surgery;  Laterality: N/A;   BIOPSY  07/22/2019   Procedure: BIOPSY;  Surgeon: Doran Stabler, MD;  Location: [facility];  Service: Gastroenterology;;   CARDIAC CATHETERIZATION  03/02/2010   NORMAL CORONARY ARTERY   CARDIAC CATHETERIZATION N/A 01/01/2015   Procedure: Coronary/Graft Angiography;  Surgeon: [place] Sine, MD;  Location: [place] [facility];  Service: Cardiovascular;  Laterality: N/A;   CARDIAC VALVE REPLACEMENT     COLONOSCOPY WITH PROPOFOL N/A 07/22/2019   Procedure: COLONOSCOPY WITH PROPOFOL;  Surgeon: Doran Stabler, MD;  Location: [facility];  Service: Gastroenterology;  Laterality: N/A;   EP IMPLANTABLE DEVICE N/A 01/01/2015   Procedure: ICD Implant;  Surgeon: Deboraha Sprang, MD;  Location: [place] [facility];  Service: Cardiovascular;  Laterality: N/A;   HEMOSTASIS CLIP PLACEMENT  07/22/2019   Procedure: HEMOSTASIS CLIP PLACEMENT;  Surgeon: Doran Stabler, MD;  [facility];  Service: Gastroenterology;;   INTRAOPERATIVE TRANSESOPHAGEAL ECHOCARDIOGRAM N/A 10/28/2013   Procedure: INTRAOPERATIVE TRANSESOPHAGEAL ECHOCARDIOGRAM;  Surgeon: [name], MD;  Location: [place];  Service: Open Heart Surgery;  Laterality: N/A;   PERIPHERAL VASCULAR CATHETERIZATION N/A 01/01/2015   Procedure: Aortic Arch Angiography;  Surgeon: [place] Sine, MD;  Location: [facility];  Service: Cardiovascular;  Laterality: N/A;   POLYPECTOMY  07/22/2019   Procedure: POLYPECTOMY;  Surgeon: Doran Stabler, MD;  [facility];  Service: Gastroenterology;;   SHOULDER ARTHROSCOPY W/ ROTATOR CUFF REPAIR Right 2012   STERNOTOMY     REDO   TEE WITHOUT CARDIOVERSION N/A 10/24/2013   Procedure: TRANSESOPHAGEAL ECHOCARDIOGRAM (TEE);  Surgeon: [place] Spark, MD;  Location: [facility];  Service: Cardiovascular;  Laterality: N/A;   THYROIDECTOMY  ~ 2005   TRANSTHORACIC ECHOCARDIOGRAM  03/2010   SHOWED MILD  REDUCTION OF LV FUNCTION     Current Outpatient Medications  Medication Sig Dispense Refill   acetaminophen (TYLENOL) 325 MG tablet Take 325 mg by mouth 2 (two) times daily as needed for moderate pain or headache.     ARIPiprazole (ABILIFY) 5 MG tablet Take 5 mg by mouth daily.     aspirin EC 81 MG tablet Take 81 mg by mouth at bedtime.      atorvastatin (LIPITOR) 40 MG tablet TAKE 1 TABLET BY MOUTH EVERY DAY 90 tablet 3   carvedilol (COREG) 6.25 MG tablet Take 1 tablet (6.25 mg total) by mouth 2 (two) times daily with a meal. 180 tablet 0   clonazePAM (KLONOPIN) 1 MG tablet Take 1 mg by mouth as needed for anxiety.     docusate sodium (COLACE) 100 MG capsule Take 100 mg by mouth 2 (two) times daily.     Ensure (ENSURE) Take 237 mLs by mouth daily as needed. 237 mL 12   FARXIGA 10 MG TABS  tablet TAKE 1 TABLET BY MOUTH EVERY DAY 30 tablet 11   furosemide (LASIX) 20 MG tablet TAKE 1 TABLET (20 MG TOTAL) BY MOUTH 4 (FOUR) TIMES A WEEK. 48 tablet 3   levothyroxine (SYNTHROID) 150 MCG tablet Take 1 tablet (150 mcg total) by mouth daily. 90 tablet 3   mexiletine (MEXITIL) 150 MG capsule TAKE 2 CAPSULES BY MOUTH EVERY MORNING AND 2 CAPSULES BY MOUTH EVERY EVENING. 360 capsule 3   ranolazine (RANEXA) 500 MG 12 hr tablet TAKE 1 TABLET BY MOUTH TWICE A DAY 180 tablet 0   sacubitril-valsartan (ENTRESTO) 97-103 MG Take 1 tablet by mouth 2 (two) times daily. NEEDS FOLLOW UP APPOINTMENT FOR ANYMORE REFILLS 409 tablet 1   Salicylic Acid (WART REMOVER EX) Apply 1 application topically daily as needed (foot corns).     sertraline (ZOLOFT) 50 MG tablet TAKE 1 TABLET BY MOUTH EVERY DAY 90 tablet 2   No current facility-administered medications for this encounter.   Facility-Administered Medications Ordered in Other Encounters  Medication Dose Route Frequency Provider Last Rate Last Admin   perflutren lipid microspheres (DEFINITY) IV suspension  1-10 mL Intravenous PRN Clegg, Amy D, NP   6 mL at 06/28/22 1156     Allergies:   Oxycodone and Rifampin   Social History:  The patient  reports that he has never smoked. He has never used smokeless tobacco. He reports that he does not drink alcohol and does not use drugs.   Family History:  The patient's family history includes Heart disease in his father; Hypertension in his father and mother; Kidney Stones in his sister; Stroke in his father; Thyroid cancer in his sister and sister.   ROS:  Please see the history of present illness.   All other systems are personally reviewed and negative.   Vitals:   06/28/22 1153  BP: 110/70  Pulse: 60  SpO2: 95%  Weight: 89.6 kg (197 lb 9.6 oz)    Wt Readings from Last 3 Encounters:  06/28/22 89.6 kg (197 lb 9.6 oz)  04/12/22 88.2 kg (194 lb 6.4 oz)  03/23/22 88.2 kg (194 lb 6.4 oz)    Exam:  General:  Well appearing. No resp difficulty HEENT: normal Neck: supple. no JVD. Carotids 2+ bilat; no bruits. No lymphadenopathy or thryomegaly appreciated. Cor: PMI nondisplaced. Regular rate & rhythm. No rubs, gallops or murmurs. Lungs: clear Abdomen: soft, nontender, nondistended. No hepatosplenomegaly. No bruits or masses. Good bowel sounds. Extremities: no cyanosis, clubbing, rash, edema Neuro: alert & orientedx3, cranial nerves grossly intact. moves all 4 extremities w/o difficulty. Affect pleasant   Recent Labs: 04/12/2022: ALT 24; BUN 17; Creatinine, Ser 1.19; Hemoglobin 16.4; Platelets 145.0; Potassium 5.0; Sodium 141 06/13/2022: TSH 1.83  Personally reviewed   Wt Readings from Last 3 Encounters:  06/28/22 89.6 kg (197 lb 9.6 oz)  04/12/22 88.2 kg (194 lb 6.4 oz)  03/23/22 88.2 kg (194 lb 6.4 oz)      ASSESSMENT AND PLAN:  1. Chronic systolic HF  - NICM St Jude  Echo 5/17 LVEF 25-30%, Mild MR, Mod LAE, RV mildly dilated, Mild RAE. ECHO 12/21/2016 LVEF 25-30%. IW AK RV mild-mod  - Echo 02/20/18 shows 30-35% AVR stable.  - Echo 2020 EF 25% Personally reviewed -  March 2017 CPX with moderate HF  limitation.  - CPX 6/18 much improved. Peak VO2: 21.1 (73% predicted peak VO2) VE/VCO2 slope: 35 - Echo 9/21 EF 35-40% - Echo EF 2022 35-40% RV ok  - Echo today 06/28/22  EF 25-30% AVR stable - Stable NYHA II-III. Volume status stable. Taking torsemide 20 3x/week + PRN - Continue Entresto 97/103 mg BID.  - Continue coreg 6.25 mg BID. Unable to titrate due to low HR - Continue Farxiga 10 mg daily. - Not on spiro due to K persistently > 5  2. VT/NICM - Quiescent on mexilitene/Ranexa - ICD interrogated in clinic personally. No recent VT  3. HTN - Stable  4. Valvular heart disease - s/p bentall in 2001 and re-do bentall 2015.  - AVR stable on echo today. Trivial AI - Reminded about need for SBE  5. Depression - Stable.    Signed, Glori Bickers, MD  06/28/2022 12:07 PM  Advanced Heart Failure [facility] [address] Heart and [place] [address] [phone] (office) [phone] (fax)'

## 2022-06-28 NOTE — Patient Instructions (Signed)
There has been no changes to your medications.  Labs done today, your results will be available in MyChart, we will contact you for abnormal readings.  Your physician recommends that you schedule a follow-up appointment in: 4 months  If you have any questions or concerns before your next appointment please send [place] a message through [place] or call our office at [phone].    TO LEAVE A MESSAGE FOR THE NURSE SELECT OPTION 2, PLEASE LEAVE A MESSAGE INCLUDING: YOUR NAME DATE OF BIRTH CALL BACK NUMBER REASON FOR CALL**this is important as we prioritize the call backs  YOU WILL RECEIVE A CALL BACK THE SAME DAY AS LONG AS YOU CALL BEFORE 4:00 PM    At the [facility], you and your health needs are our priority. As part of our continuing mission to provide you with exceptional heart care, we have created designated Provider Care Teams. These Care Teams include your primary Cardiologist (physician) and Advanced Practice Providers (APPs- Physician Assistants and Nurse Practitioners) who all work together to provide you with the care you need, when you need it.   You may see any of the following providers on your designated Care Team at your next follow up: Dr Glori Bickers Dr Loralie Champagne Dr. Roxana Hires, NP Lyda Jester, [place] [facility] [place], [place] Forestine Na, NP Audry Riles, PharmD   Please be sure to bring in all your medications bottles to every appointment.

## 2022-07-11 ENCOUNTER — Encounter: Payer: Self-pay | Admitting: Internal Medicine

## 2022-07-11 ENCOUNTER — Ambulatory Visit: Payer: Medicare Other | Attending: Internal Medicine | Admitting: Internal Medicine

## 2022-07-11 VITALS — BP 134/86 | HR 63 | Ht 72.0 in | Wt 196.2 lb

## 2022-07-11 DIAGNOSIS — Z95810 Presence of automatic (implantable) cardiac defibrillator: Secondary | ICD-10-CM | POA: Diagnosis not present

## 2022-07-11 DIAGNOSIS — I472 Ventricular tachycardia, unspecified: Secondary | ICD-10-CM | POA: Diagnosis not present

## 2022-07-11 DIAGNOSIS — I428 Other cardiomyopathies: Secondary | ICD-10-CM | POA: Diagnosis not present

## 2022-07-11 DIAGNOSIS — I5022 Chronic systolic (congestive) heart failure: Secondary | ICD-10-CM

## 2022-07-11 NOTE — Progress Notes (Signed)
Patient Care Team: [name], MD as PCP - Desma Paganini, MD as PCP - Cardiology (Cardiology) Deboraha Sprang, MD as PCP - Electrophysiology (Cardiology) Bensimhon, Shaune Pascal, MD as PCP - Advanced Heart Failure (Cardiology) [name], MD (Inactive) as Consulting Physician (Cardiothoracic Surgery) Deterding, Jeneen Rinks, MD as Consulting Physician (Nephrology) Deboraha Sprang, MD as Consulting Physician (Cardiology) Viona Gilmore, [facility] as Pharmacist (Pharmacist)   HPI  Bradley [name] is a 66 y.o. male Seen in follow-up for implantable defibrillator St Jude  for sustained ventricular tachycardia 5/16 occurring with a frequency that required adjunctive medical therapy. He ended up on amiodarone but then switched to mex and ranolazine.   A balloon valvotomy at age 50 for presumed congenital aortic stenosis   Bentall procedure in 2001 with a pericardial tissue valve.  He had MSSA bacteremia and bacterial endocarditis in 09/6142 complicated by renal failure requiring dialysis for a short period of time.  He was then readmitted 09/2013 with sternoclavicular osteomyelitis and was found to have perivalvular abscess requiring redo Bentall with homograft and debridement of root   The patient denies chest pain, nocturnal dyspnea, orthopnea or peripheral edema.  There have been no palpitations, lightheadedness or syncope.  Some dyspnea esp with incline  Polycythemia, seen by hematology, JAK2 mutation testing was negative.  Thought to have erythrocytosis secondary to heart disease and perhaps pseudo erythrocytosis secondary to diuretic therapy   History of appropriate therapy: yes - ATP Antiarrhythmics Date Reason stopped  Amiodarone  decision   Mexiletine  2016 ongoing  Ranolazine 2016  ongoing   Date Cr K Dig TSH Hgb  10/19 1.08 4.5  2.57    11/20 1.12 4.7  1.75   7/22 1.26 4.9   17.9  11/23 1.25 3.9  1.83 16.4   DATE TEST EF   10/14 Echo  60-65 %   6/16 Echo   30-35 %   5/18 Echo  25-30%   7/19 Echo  30-35%   7/22 Echo  35-40%   11/23 Echo  25-30%          Past Medical History:  Diagnosis Date   Anxiety    Aortic stenosis    s/p Bentall with bioprosthetic AVR 02/2010; Last echo (9/11): Moderate LVH, EF 45-50%, AVR functioning appropriately, aortic valve mean gradient 21, diastolic dysfunction. Chest MRA (2/13): Mild to moderate dilatation at the sinus of Valsalva at 4.1 cm, mild dilatation ascending aorta distal to the tube graft at 3.9 cm, moderate dilatation of the innominate artery a 2.1 cm;     CHF (congestive heart failure) ([name])    Depression    ESRD on dialysis ([place])    a. 09/2013 felt to be related to gentamycin b. no longer requiring HD   Hx of cardiac cath    a. LHC in 02/2010: normal cors   Hx of echocardiogram 02/2014   Echo (8/15):  Mod LV, EF 35-40%, Gr 1 DD, AVR ok (mean 14 mmHg), mild LAE   Hypertension    Hypothyroidism, postsurgical    Prosthetic valve endocarditis (HCC)    Staphylococcus aureus bacteremia    Thyroid cancer ([place])    Ventricular tachycardia ([place])    a. s/p STJ ICD    Past Surgical History:  Procedure Laterality Date   AORTIC VALVE REPAIR  1968   AORTIC VALVE REPLACEMENT  2011   AV FISTULA PLACEMENT Left 10/08/2013   Procedure: ARTERIOVENOUS (AV) FISTULA CREATION- LEFT ARM; Radial  Cephalic ;  Surgeon: Mal [name], MD;  Location: [facility] OR;  Service: Vascular;  Laterality: Left;   BENTALL PROCEDURE N/A 10/28/2013   Procedure: REDO BENTALL PROCEDURE, debridment of aoritc root abscess, replacement of aortic root, ascending aorta and aortic valve with homograft. Insertion of left femoral arterial line;  Surgeon: [name], MD;  Location: [place];  Service: Open Heart Surgery;  Laterality: N/A;   BIOPSY  07/22/2019   Procedure: BIOPSY;  Surgeon: Doran Stabler, MD;  Location: [facility];  Service: Gastroenterology;;   CARDIAC CATHETERIZATION  03/02/2010   NORMAL CORONARY ARTERY   CARDIAC  CATHETERIZATION N/A 01/01/2015   Procedure: Coronary/Graft Angiography;  Surgeon: [place] Sine, MD;  Location: [place] [facility];  Service: Cardiovascular;  Laterality: N/A;   CARDIAC VALVE REPLACEMENT     COLONOSCOPY WITH PROPOFOL N/A 07/22/2019   Procedure: COLONOSCOPY WITH PROPOFOL;  Surgeon: Doran Stabler, MD;  [facility];  Service: Gastroenterology;  Laterality: N/A;   EP IMPLANTABLE DEVICE N/A 01/01/2015   Procedure: ICD Implant;  Surgeon: Deboraha Sprang, MD;  Location: [place] [facility];  Service: Cardiovascular;  Laterality: N/A;   HEMOSTASIS CLIP PLACEMENT  07/22/2019   Procedure: HEMOSTASIS CLIP PLACEMENT;  Surgeon: Doran Stabler, MD;  [facility];  Service: Gastroenterology;;   INTRAOPERATIVE TRANSESOPHAGEAL ECHOCARDIOGRAM N/A 10/28/2013   Procedure: INTRAOPERATIVE TRANSESOPHAGEAL ECHOCARDIOGRAM;  Surgeon: [name], MD;  Location: [facility];  Service: Open Heart Surgery;  Laterality: N/A;   PERIPHERAL VASCULAR CATHETERIZATION N/A 01/01/2015   Procedure: Aortic Arch Angiography;  Surgeon: [place] Sine, MD;  Location: [place] [facility];  Service: Cardiovascular;  Laterality: N/A;   POLYPECTOMY  07/22/2019   Procedure: POLYPECTOMY;  Surgeon: Doran Stabler, MD;  [facility];  Service: Gastroenterology;;   SHOULDER ARTHROSCOPY W/ ROTATOR CUFF REPAIR Right 2012   STERNOTOMY     REDO   TEE WITHOUT CARDIOVERSION N/A 10/24/2013   Procedure: TRANSESOPHAGEAL ECHOCARDIOGRAM (TEE);  Surgeon: Dorothy Spark, MD;  Location: [place];  Service: Cardiovascular;  Laterality: N/A;   THYROIDECTOMY  ~ 2005   TRANSTHORACIC ECHOCARDIOGRAM  03/2010   SHOWED MILD REDUCTION OF LV FUNCTION    Current Outpatient Medications  Medication Sig Dispense Refill   acetaminophen (TYLENOL) 325 MG tablet Take 325 mg by mouth 2 (two) times daily as needed for moderate pain or headache.     ARIPiprazole (ABILIFY) 5 MG tablet Take 5 mg by mouth daily.      aspirin EC 81 MG tablet Take 81 mg by mouth at bedtime.      atorvastatin (LIPITOR) 40 MG tablet TAKE 1 TABLET BY MOUTH EVERY DAY 90 tablet 3   carvedilol (COREG) 6.25 MG tablet Take 1 tablet (6.25 mg total) by mouth 2 (two) times daily with a meal. 180 tablet 0   clonazePAM (KLONOPIN) 1 MG tablet Take 1 mg by mouth as needed for anxiety.     docusate sodium (COLACE) 100 MG capsule Take 100 mg by mouth 2 (two) times daily.     Ensure (ENSURE) Take 237 mLs by mouth daily as needed. 237 mL 12   FARXIGA 10 MG TABS tablet TAKE 1 TABLET BY MOUTH EVERY DAY 30 tablet 11   furosemide (LASIX) 20 MG tablet TAKE 1 TABLET (20 MG TOTAL) BY MOUTH 4 (FOUR) TIMES A WEEK. 48 tablet 3   levothyroxine (SYNTHROID) 150 MCG tablet Take 1 tablet (150 mcg total) by mouth daily. [place]  tablet 3   mexiletine (MEXITIL) 150 MG capsule TAKE 2 CAPSULES BY MOUTH EVERY MORNING AND 2 CAPSULES BY MOUTH EVERY EVENING. 360 capsule 3   ranolazine (RANEXA) 500 MG 12 hr tablet TAKE 1 TABLET BY MOUTH TWICE A DAY 180 tablet 0   sacubitril-valsartan (ENTRESTO) 97-103 MG Take 1 tablet by mouth 2 (two) times daily. NEEDS FOLLOW UP APPOINTMENT FOR ANYMORE REFILLS 768 tablet 1   Salicylic Acid (WART REMOVER EX) Apply 1 application topically daily as needed (foot corns).     sertraline (ZOLOFT) 50 MG tablet TAKE 1 TABLET BY MOUTH EVERY DAY 90 tablet 2   No current facility-administered medications for this visit.    Allergies  Allergen Reactions   Oxycodone Other (See Comments)    Gives patient nightmares   Rifampin Nausea Only      Review of Systems negative except from HPI and PMH  Physical Exam BP 134/86   Pulse 63   Ht 6' (1.829 m)   Wt 196 lb 3.2 oz (89 kg)   SpO2 98%   BMI 26.61 kg/m  Well developed and well nourished in no acute distress HENT normal Neck supple with JVP-flat Clear Device pocket well healed; without hematoma or erythema.  There is no tethering  Regular rate and rhythm, no  gallop No/ murmur Abd-soft  with active BS No Clubbing cyanosis  edema Skin-warm and dry A & Oriented  Grossly normal sensory and motor function  ECG sinus at 63 Intervals 22/15/42  Device function is normal. Programming changes   See Paceart for details    Assessment and  Plan  Aortic Valve Disease with valvotomy, Bentall 2011 with redo Bentall 2015  Ventricular tachycarda  ICD St Jude  The patient's device was interrogated.  The information was reviewed. No changes were made in the programming.      NICM  CHF chronic systolic  Polycythemia  No interval tachycardia will continue mexiletine 300 twice daily and ranolazine 500 twice daily as well as his carvedilol 6.25.  Euvolemic.  Continue furosemide 20 mg 4 times a week.

## 2022-07-11 NOTE — Patient Instructions (Signed)
Medication Instructions:  Your physician recommends that you continue on your current medications as directed. Please refer to the Current Medication list given to you today.  *If you need a refill on your cardiac medications before your next appointment, please call your pharmacy*   Lab Work: None ordered.  If you have labs (blood work) drawn today and your tests are completely normal, you will receive your results only by: [place] (if you have MyChart) OR A paper copy in the mail If you have any lab test that is abnormal or we need to change your treatment, we will call you to review the results.   Testing/Procedures: None ordered.    Follow-Up: At [facility], you and your health needs are our priority.  As part of our continuing mission to provide you with exceptional heart care, we have created designated Provider Care Teams.  These Care Teams include your primary Cardiologist (physician) and Advanced Practice Providers (APPs -  Physician Assistants and Nurse Practitioners) who all work together to provide you with the care you need, when you need it.  We recommend signing up for the patient portal called "MyChart".  Sign up information is provided on this After Visit Summary.  MyChart is used to connect with patients for Virtual Visits (Telemedicine).  Patients are able to view lab/test results, encounter notes, upcoming appointments, etc.  Non-urgent messages can be sent to your provider as well.   To learn more about what you can do with MyChart, go to [email or web address].    Your next appointment:   12 months with Dr Olin Pia PA, Oda Kilts  Important Information About Sugar

## 2022-07-17 ENCOUNTER — Ambulatory Visit (INDEPENDENT_AMBULATORY_CARE_PROVIDER_SITE_OTHER): Payer: Medicare Other

## 2022-07-17 DIAGNOSIS — Z95810 Presence of automatic (implantable) cardiac defibrillator: Secondary | ICD-10-CM

## 2022-07-17 DIAGNOSIS — I5022 Chronic systolic (congestive) heart failure: Secondary | ICD-10-CM | POA: Diagnosis not present

## 2022-07-18 ENCOUNTER — Telehealth: Payer: Self-pay | Admitting: Pharmacist

## 2022-07-18 NOTE — Chronic Care Management (AMB) (Signed)
Chronic Care Management Pharmacy Assistant   Name: JADDEN YIM  MRN: [id number] DOB: February 21, 1956  Reason for Encounter: Disease State / Hypertension Assessment Call   Conditions to be addressed/monitored: HTN  Recent office visits:  04/12/2022 [name] MD - Patient was seen for Physical exam and additional concerns. No mediation changes. No follow up noted.   Recent consult visits:  07/11/2022 [name] MD (cardiology) - Patient was seen for Ventricular tachycardia and additional concerns. No medication changes. Follow up in 12 months.   Hospital visits:  None  Medications: Outpatient Encounter Medications as of 07/18/2022  Medication Sig   acetaminophen (TYLENOL) 325 MG tablet Take 325 mg by mouth 2 (two) times daily as needed for moderate pain or headache.   ARIPiprazole (ABILIFY) 5 MG tablet Take 5 mg by mouth daily.   aspirin EC 81 MG tablet Take 81 mg by mouth at bedtime.    atorvastatin (LIPITOR) 40 MG tablet TAKE 1 TABLET BY MOUTH EVERY DAY   carvedilol (COREG) 6.25 MG tablet Take 1 tablet (6.25 mg total) by mouth 2 (two) times daily with a meal.   clonazePAM (KLONOPIN) 1 MG tablet Take 1 mg by mouth as needed for anxiety.   docusate sodium (COLACE) 100 MG capsule Take 100 mg by mouth 2 (two) times daily.   Ensure (ENSURE) Take 237 mLs by mouth daily as needed.   FARXIGA 10 MG TABS tablet TAKE 1 TABLET BY MOUTH EVERY DAY   furosemide (LASIX) 20 MG tablet TAKE 1 TABLET (20 MG TOTAL) BY MOUTH 4 (FOUR) TIMES A WEEK.   levothyroxine (SYNTHROID) 150 MCG tablet Take 1 tablet (150 mcg total) by mouth daily.   mexiletine (MEXITIL) 150 MG capsule TAKE 2 CAPSULES BY MOUTH EVERY MORNING AND 2 CAPSULES BY MOUTH EVERY EVENING.   ranolazine (RANEXA) 500 MG 12 hr tablet TAKE 1 TABLET BY MOUTH TWICE A DAY   sacubitril-valsartan (ENTRESTO) 97-103 MG Take 1 tablet by mouth 2 (two) times daily. NEEDS FOLLOW UP APPOINTMENT FOR ANYMORE REFILLS   Salicylic Acid (WART REMOVER EX)  Apply 1 application topically daily as needed (foot corns).   sertraline (ZOLOFT) 50 MG tablet TAKE 1 TABLET BY MOUTH EVERY DAY   No facility-administered encounter medications on file as of 07/18/2022.  Fill History:   Dispensed Days Supply Quantity Provider Pharmacy  ARIPIPRAZOLE 10 MG TABLET 06/02/2022 90 90 each      Dispensed Days Supply Quantity Provider Pharmacy  ATORVASTATIN 40 MG TABLET 06/28/2022 90 90 each      Dispensed Days Supply Quantity Provider Pharmacy  CARVEDILOL 6.25 MG TABLET 06/19/2022 90 180 each      Dispensed Days Supply Quantity Provider Pharmacy  CLONAZEPAM 1 MG TABLET 02/03/2022 15 30 each      Dispensed Days Supply Quantity Provider Pharmacy  FARXIGA 10 MG TABLET 07/02/2022 30 30 each      Dispensed Days Supply Quantity Provider Pharmacy  FUROSEMIDE 20 MG TABLET 06/02/2022 84 48 each      Dispensed Days Supply Quantity Provider Pharmacy  LEVOTHYROXINE SODIUM  150 MCG TABS 07/13/2022 90 90 tablet      Dispensed Days Supply Quantity Provider Pharmacy  MEXILETINE 150 MG CAPSULE 06/19/2022 90 360 each      Dispensed Days Supply Quantity Provider Pharmacy  RANOLAZINE ER 500 MG TABLET 05/07/2022 90 180 each      Dispensed Days Supply Quantity Provider Pharmacy  ENTRESTO 97 MG-103 MG TABLET 05/14/2022 90 180 each  Dispensed Days Supply Quantity Provider Pharmacy  SERTRALINE HCL 50 MG TABLET 06/02/2022 90 90 each     Reviewed chart prior to disease state call. Spoke with patient regarding BP  Recent Office Vitals: BP Readings from Last 3 Encounters:  07/11/22 134/86  06/28/22 110/70  04/12/22 110/70   Pulse Readings from Last 3 Encounters:  07/11/22 63  06/28/22 60  04/12/22 60    Wt Readings from Last 3 Encounters:  07/11/22 196 lb 3.2 oz (89 kg)  06/28/22 197 lb 9.6 oz (89.6 kg)  04/12/22 194 lb 6.4 oz (88.2 kg)     Kidney Function Lab Results  Component Value Date/Time   CREATININE 1.25 (H) 06/28/2022 12:16 PM   CREATININE 1.19  04/12/2022 10:51 AM   CREATININE 1.28 10/14/2015 12:28 PM   CREATININE 1.21 07/12/2015 12:07 PM   GFR 64.25 04/12/2022 10:51 AM   GFRNONAA >60 06/28/2022 12:16 PM   GFRAA 74 04/13/2020 04:09 PM       Latest Ref Rng & Units 06/28/2022   12:16 PM 04/12/2022   10:51 AM 03/23/2022   11:23 AM  BMP  Glucose 70 - 99 mg/dL 135  92  137   BUN 8 - 23 mg/dL '19  17  17   '$ Creatinine 0.61 - 1.24 mg/dL 1.25  1.19  1.34   Sodium 135 - 145 mmol/L 138  141  141   Potassium 3.5 - 5.1 mmol/L 3.9  5.0  4.7   Chloride 98 - 111 mmol/L 108  104  111   CO2 22 - 32 mmol/L '23  28  23   '$ Calcium 8.9 - 10.3 mg/dL 9.0  9.7  9.3     Current antihypertensive regimen:  Carvedilol 6.25 mg twice daily Entresto 97/103 mg twice daily  How often are you checking your Blood Pressure? Patient's wife states she is checking his blood pressures a couple times per month.   Current home BP readings:  Patient not available to check his bp today.   07/07/22 - 137/79  07/03/22 - 128/71  06/28/22 - 143/84  06/27/22 - 157/84   05/27/22 - 126/78  What recent interventions/DTPs have been made by any provider to improve Blood Pressure control since last CPP Visit: No recent interventions.   Any recent hospitalizations or ED visits since last visit with CPP? No recent hospital visits.   What diet changes have been made to improve Blood Pressure Control?  Patient follows a lower carb and sodium Breakfast - patient generally doesn't eat breakfast Lunch - patient will have a variety from hamburger to leftovers Dinner - patient will have a meal containing a meat and vegetables.   What exercise is being done to improve your Blood Pressure Control?  Patient states he walks daily and does his own yard work   Adherence Review: Is the patient currently on ACE/ARB medication? No Does the patient have >5 day gap between last estimated fill dates? No  Care Gaps: AWV - scheduled 10/19/2022 Last BP - 134/86 on 07/11/2022 Shingrix  - never done Covid - overdue HIV Screen - postponed  Star Rating Drugs: Atorvastatin '40mg'$  - [facility]  [place] '10mg'$  - last filled 07/02/2022 30 DS at [place] Pharmacist Assistant [phone]

## 2022-07-20 NOTE — Progress Notes (Signed)
EPIC Encounter for ICM Monitoring  Patient Name: Bradley [name] is a 66 y.o. male Date: 07/20/2022 Primary Care Physican: [name], MD Heart Failure Clinic: [place] Electrophysiologist: Caryl Comes Nephrologist:  [place] 07/11/2022 Office Weight: 196 lbs.  (Does not weigh at home)                                                Transmission reviewed.    CorVue thoracic impedance suggesting normal fluid levels.   Prescribed:  Furosemide 20 mg Take 1 tablet (20 mg total) by mouth 4 (four) times a week.   Per 06/28/2022 OV note from Dr Haroldine Laws take Furosemide 3 times week + PRN. Farxiga 10 mg take 1 tablet by mouth daily    Labs: 06/28/2022 Creatinine 1.25, BUN 19, Potassium 3.9, Sodium 138, GFR >60 04/12/2022 Creatinine 1.19, BUN 17, Potassium 5.0, Sodium 141, GFR 64.25 03/23/2022 Creatinine 1.34, BUN 17, Potassium 4.7, Sodium 141, GFR 59  A complete set of results can be found in Results Review.   Recommendations:  No changes   Follow-up plan: ICM clinic phone appointment on 08/21/2022.   91 day device clinic remote transmission 09/07/2022.     EP/Cardiology Office Visits: 10/23/2022 with Dr Haroldine Laws.  Recall 07/06/2023 with Oda Kilts, PA     Copy of ICM check sent to Dr. Caryl Comes.    3 month ICM trend: 07/17/2022.    12-14 Month ICM trend:     [name], RN 07/20/2022 7:27 AM

## 2022-08-02 ENCOUNTER — Other Ambulatory Visit: Payer: Self-pay | Admitting: Internal Medicine

## 2022-08-13 ENCOUNTER — Other Ambulatory Visit: Payer: Self-pay | Admitting: Family Medicine

## 2022-08-13 ENCOUNTER — Other Ambulatory Visit (HOSPITAL_COMMUNITY): Payer: Self-pay | Admitting: Internal Medicine

## 2022-08-21 ENCOUNTER — Ambulatory Visit: Payer: Medicare Other

## 2022-08-21 DIAGNOSIS — I5022 Chronic systolic (congestive) heart failure: Secondary | ICD-10-CM

## 2022-08-21 DIAGNOSIS — Z95810 Presence of automatic (implantable) cardiac defibrillator: Secondary | ICD-10-CM

## 2022-08-23 NOTE — Progress Notes (Signed)
EPIC Encounter for ICM Monitoring  Patient Name: Bradley [name] is a 67 y.o. male Date: 08/23/2022 Primary Care Physican: [name], MD Heart Failure Clinic: [place] Electrophysiologist: Caryl Comes Nephrologist:  [place] 08/23/2022 Weight: 198.2 lbs                                              Spoke with wife and heart failure questions reviewed.  Transmission results reviewed.  Pt asymptomatic for fluid accumulation.  Reports feeling well at this time and voices no complaints.     CorVue thoracic impedance suggesting normal fluid levels.   Prescribed:  Furosemide 20 mg Take 1 tablet (20 mg total) by mouth 4 (four) times a week.   Per 06/28/2022 OV note from Dr Haroldine Laws take Furosemide 3 times week + PRN and wife confirmed on 08/23/22 he is taking 3 times week unless 4th dose needed for fluid symptoms.   Farxiga 10 mg take 1 tablet by mouth daily    Labs: 06/28/2022 Creatinine 1.25, BUN 19, Potassium 3.9, Sodium 138, GFR >60 04/12/2022 Creatinine 1.19, BUN 17, Potassium 5.0, Sodium 141, GFR 64.25 03/23/2022 Creatinine 1.34, BUN 17, Potassium 4.7, Sodium 141, GFR 59  A complete set of results can be found in Results Review.   Recommendations: No changes and encouraged to call if experiencing any fluid symptoms.   Follow-up plan: ICM clinic phone appointment on 09/25/2022.   91 day device clinic remote transmission 09/07/2022.     EP/Cardiology Office Visits: 10/23/2022 with Dr Haroldine Laws.  Recall 07/06/2023 with Oda Kilts, PA     Copy of ICM check sent to Dr. Caryl Comes.    3 month ICM trend: 08/21/2022.    12-14 Month ICM trend:     [name], RN 08/23/2022 9:37 AM

## 2022-09-06 ENCOUNTER — Telehealth: Payer: Self-pay

## 2022-09-06 NOTE — Patient Instructions (Signed)
Visit Information  Thank you for taking time to visit with me today. Please don't hesitate to contact me if I can be of assistance to you.   Following are the goals we discussed today:   Goals Addressed             This Visit's Progress    COMPLETED: Care coordination activities-No follow up required       Interventions Today    Flowsheet Row Most Recent Value  Chronic Disease Discussed/Reviewed   Chronic disease discussed/reviewed during today's visit Congestive Heart Failure (CHF), Hypertension (HTN)  General Interventions   General Interventions Discussed/Reviewed General Interventions Discussed, Doctor Visits, Health Screening  Doctor Visits Discussed/Reviewed Annual Wellness Visits, Doctor Visits Discussed  Health Screening Colonoscopy  Education Interventions   Education Provided Provided Verbal Education  Provided Verbal Education On Nutrition  Nutrition Interventions   Nutrition Discussed/Reviewed Nutrition Discussed, Decreasing salt               If you are experiencing a Mental Health or [place] or need someone to talk to, please call the Suicide and Crisis Lifeline: 988   Patient verbalizes understanding of instructions and care plan provided today and agrees to view in [place]. Active MyChart status and patient understanding of how to access instructions and care plan via MyChart confirmed with patient.     No further follow up required: decline  Jone Baseman, RN, MSN [place] Management Care Management Coordinator Direct Line [phone]

## 2022-09-06 NOTE — Patient Outreach (Signed)
  Care Coordination   Initial Visit Note   09/06/2022 Name: HENRRY FEIL MRN: [id number] DOB: 1955/11/20  PHYLLIS WHITEFIELD is a 67 y.o. year old male who sees Burchette, Alinda Sierras, MD for primary care. I spoke with  [name] by phone today.  What matters to the patients health and wellness today?  none    Goals Addressed             This Visit's Progress    COMPLETED: Care coordination activities-No follow up required       Interventions Today    Flowsheet Row Most Recent Value  Chronic Disease Discussed/Reviewed   Chronic disease discussed/reviewed during today's visit Congestive Heart Failure (CHF), Hypertension (HTN)  General Interventions   General Interventions Discussed/Reviewed General Interventions Discussed, Doctor Visits, Health Screening  Doctor Visits Discussed/Reviewed Annual Wellness Visits, Doctor Visits Discussed  Health Screening Colonoscopy  Education Interventions   Education Provided Provided Verbal Education  Provided Verbal Education On Nutrition  Nutrition Interventions   Nutrition Discussed/Reviewed Nutrition Discussed, Decreasing salt              SDOH assessments and interventions completed:  Yes  SDOH Interventions Today    Flowsheet Row Most Recent Value  SDOH Interventions   Food Insecurity Interventions Intervention Not Indicated  Housing Interventions Intervention Not Indicated        Care Coordination Interventions:  Yes, provided   Follow up plan: No further intervention required.   Encounter Outcome:  Pt. Visit Completed   Jone Baseman, RN, MSN [place] Management Care Management Coordinator Direct Line [phone]

## 2022-09-07 ENCOUNTER — Ambulatory Visit (INDEPENDENT_AMBULATORY_CARE_PROVIDER_SITE_OTHER): Payer: 59

## 2022-09-07 DIAGNOSIS — I428 Other cardiomyopathies: Secondary | ICD-10-CM

## 2022-09-07 LAB — CUP PACEART REMOTE DEVICE CHECK
Battery Remaining Longevity: 18 mo
Battery Remaining Percentage: 18 %
Battery Voltage: 2.75 V
Brady Statistic RV Percent Paced: 1 %
Date Time Interrogation Session: 20240208020026
HighPow Impedance: 66 Ohm
HighPow Impedance: 66 Ohm
Implantable Lead Connection Status: 753985
Implantable Lead Implant Date: 20160603
Implantable Lead Location: 753860
Implantable Lead Model: 7122
Implantable Pulse Generator Implant Date: 20160603
Lead Channel Impedance Value: 400 Ohm
Lead Channel Pacing Threshold Amplitude: 1.5 V
Lead Channel Pacing Threshold Pulse Width: 0.5 ms
Lead Channel Sensing Intrinsic Amplitude: 6.5 mV
Lead Channel Setting Pacing Amplitude: 2.5 V
Lead Channel Setting Pacing Pulse Width: 0.5 ms
Lead Channel Setting Sensing Sensitivity: 0.5 mV
Pulse Gen Serial Number: 7135169

## 2022-09-18 ENCOUNTER — Other Ambulatory Visit (HOSPITAL_COMMUNITY): Payer: Self-pay

## 2022-09-25 ENCOUNTER — Other Ambulatory Visit: Payer: Self-pay | Admitting: Family Medicine

## 2022-09-25 ENCOUNTER — Ambulatory Visit: Payer: 59 | Attending: Internal Medicine

## 2022-09-25 DIAGNOSIS — Z95810 Presence of automatic (implantable) cardiac defibrillator: Secondary | ICD-10-CM

## 2022-09-25 DIAGNOSIS — I5022 Chronic systolic (congestive) heart failure: Secondary | ICD-10-CM | POA: Diagnosis not present

## 2022-09-27 ENCOUNTER — Telehealth: Payer: Self-pay

## 2022-09-27 NOTE — Progress Notes (Signed)
Remote ICD transmission.   

## 2022-09-27 NOTE — Telephone Encounter (Signed)
Remote ICM transmission received.  Attempted call to wife/patient regarding ICM remote transmission and no answer or voice mail option.

## 2022-09-27 NOTE — Progress Notes (Signed)
EPIC Encounter for ICM Monitoring  Patient Name: Bradley [name] is a 67 y.o. male Date: 09/27/2022 Primary Care Physican: [name], MD Heart Failure Clinic: [place] Electrophysiologist: Caryl Comes Nephrologist:  [place] 08/23/2022 Weight: 198.2 lbs                                              Attempted call to wife/patient and unable to reach. Transmission reviewed.    CorVue thoracic impedance suggesting normal fluid levels with the exception of possible fluid accumulation from 1/29-2/13.   Prescribed:  Furosemide 20 mg Take 1 tablet (20 mg total) by mouth 4 (four) times a week.   Per 06/28/2022 OV note from Dr Haroldine Laws take Furosemide 3 times week + PRN and wife confirmed on 08/23/22 he is taking 3 times week unless 4th dose needed for fluid symptoms.   Farxiga 10 mg take 1 tablet by mouth daily    Labs: 06/28/2022 Creatinine 1.25, BUN 19, Potassium 3.9, Sodium 138, GFR >60 04/12/2022 Creatinine 1.19, BUN 17, Potassium 5.0, Sodium 141, GFR 64.25 03/23/2022 Creatinine 1.34, BUN 17, Potassium 4.7, Sodium 141, GFR 59  A complete set of results can be found in Results Review.   Recommendations:  Unable to reach.     Follow-up plan: ICM clinic phone appointment on 10/30/2022.   91 day device clinic remote transmission 12/07/2022.     EP/Cardiology Office Visits: 10/23/2022 with Dr Haroldine Laws.  Recall 07/06/2023 with Oda Kilts, PA     Copy of ICM check sent to Dr. Caryl Comes.  3 month ICM trend: 09/25/2022.    12-14 Month ICM trend:     [name], RN 09/27/2022 3:57 PM

## 2022-09-29 ENCOUNTER — Telehealth: Payer: Self-pay

## 2022-09-29 NOTE — Progress Notes (Signed)
Care Management & Coordination Services Pharmacy Team  Reason for Encounter: Appointment Reminder  Contacted patient to confirm telephone appointment with Burman Riis, PharmD on 10/02/2022 at 12:00. Spoke with patients wife on 09/29/2022   Do you have any problems getting your medications? Patients wife denies  What is your top health concern you would like to discuss at your upcoming visit? Patients wife denies  Have you seen any other providers since your last visit with PCP? Patients wife denies  Care Gaps: AWV - completed 10/17/2021, scheduled 10/19/2022 Last BP - 134/86 on 07/11/2022 Shingrix - never done Covid - overdue Colonoscopy - overdue HIV Screen - postponed   Star Rating Drugs: Atorvastatin '40mg'$  - [facility]  [place] '10mg'$  - last filled 09/26/2022 30 DS at [place] Pharmacist Assistant [phone]

## 2022-10-01 NOTE — Progress Notes (Unsigned)
Care Management & Coordination Services Pharmacy Note  10/02/2022 Name:  Bradley [name] MRN:  [id number] DOB:  [birth year]  Summary: BP at goal <140/90, denies weight gain, checking both daily Pt in good mood, feels depression/anxiety currently well-controlled   Recommendations/Changes made from today's visit: -Check BP/HR daily and keep a log -Check weight daily, report any weight gain >3lbs/day or >5lbs/week to cardio/PCP -Notify office of any fluctuations in mood that you feel are not being well-controlled with currently prescribed regimen  Follow up plan: BP review in 3 months Pharmacist visit in 6 months   Subjective: Bradley [name] is an 67 y.o. year old male who is a primary patient of Burchette, Alinda Sierras, MD.  The care coordination team was consulted for assistance with disease management and care coordination needs.    Engaged with patient by telephone for follow up visit.  Recent office visits: 04/12/2022 [name] MD - Patient was seen for Physical exam and additional concerns. No mediation changes. No follow up noted.    Recent consult visits: 07/11/2022 [name] MD (cardiology) - Patient was seen for Ventricular tachycardia and additional concerns. No medication changes. Follow up in 12 months.   Hospital visits: None in previous 6 months   Objective:  Lab Results  Component Value Date   CREATININE 1.25 (H) 06/28/2022   BUN 19 06/28/2022   GFR 64.25 04/12/2022   GFRNONAA >60 06/28/2022   GFRAA 74 04/13/2020   NA 138 06/28/2022   K 3.9 06/28/2022   CALCIUM 9.0 06/28/2022   CO2 23 06/28/2022   GLUCOSE 135 (H) 06/28/2022    Lab Results  Component Value Date/Time   HGBA1C 5.9 (H) 12/30/2014 04:52 AM   HGBA1C 4.9 10/27/2013 06:33 PM   GFR 64.25 04/12/2022 10:51 AM   GFR 66.36 06/11/2019 10:40 AM    Last diabetic Eye exam: No results found for: "HMDIABEYEEXA"  Last diabetic Foot exam: No results found for: "HMDIABFOOTEX"   Lab Results   Component Value Date   CHOL 137 04/12/2022   HDL 46.00 04/12/2022   LDLCALC 72 04/12/2022   TRIG 97.0 04/12/2022   CHOLHDL 3 04/12/2022       Latest Ref Rng & Units 04/12/2022   10:51 AM 02/14/2021    1:08 PM 10/25/2020   12:37 PM  Hepatic Function  Total Protein 6.0 - 8.3 g/dL 6.8  7.1  6.7   Albumin 3.5 - 5.2 g/dL 4.0  4.0  3.8   AST 0 - 37 U/L 22  25  34   ALT 0 - 53 U/L 24  33  53   Alk Phosphatase 39 - 117 U/L 55  57  57   Total Bilirubin 0.2 - 1.2 mg/dL 0.6  0.7  0.8   Bilirubin, Direct 0.0 - 0.3 mg/dL 0.1       Lab Results  Component Value Date/Time   TSH 1.83 06/13/2022 12:59 PM   TSH 0.25 (L) 04/12/2022 10:51 AM   FREET4 1.00 10/25/2020 12:37 PM   FREET4 0.98 12/31/2014 08:55 AM       Latest Ref Rng & Units 04/12/2022   10:51 AM 09/16/2021   10:56 AM 05/16/2021   12:23 PM  CBC  WBC 4.0 - 10.5 K/uL 6.2  6.9  7.3   Hemoglobin 13.0 - 17.0 g/dL 16.4  17.2  17.3   Hematocrit 39.0 - 52.0 % 49.7  52.6  51.1   Platelets 150.0 - 400.0 K/uL 145.0  150  153  Lab Results  Component Value Date/Time   VITAMINB12 999 (H) 09/22/2013 03:00 AM    Clinical ASCVD: Yes  The ASCVD Risk score (Arnett DK, et al., 2019) failed to calculate for the following reasons:   The patient has a prior MI or stroke diagnosis       09/06/2022   12:06 PM 04/12/2022   10:14 AM 10/17/2021    3:03 PM  Depression screen PHQ 2/9  Decreased Interest 0 2 0  Down, Depressed, Hopeless 0 0 0  PHQ - 2 Score 0 2 0  Altered sleeping  2   Tired, decreased energy  2   Change in appetite  0   Feeling bad or failure about yourself   0   Trouble concentrating  0   Moving slowly or fidgety/restless  1   Suicidal thoughts  0   PHQ-9 Score  7   Difficult doing work/chores  Not difficult at all      Social History   Tobacco Use  Smoking Status Never  Smokeless Tobacco Never   BP Readings from Last 3 Encounters:  07/11/22 134/86  06/28/22 110/70  04/12/22 110/70   Pulse Readings from Last  3 Encounters:  07/11/22 63  06/28/22 60  04/12/22 60   Wt Readings from Last 3 Encounters:  07/11/22 196 lb 3.2 oz (89 kg)  06/28/22 197 lb 9.6 oz (89.6 kg)  04/12/22 194 lb 6.4 oz (88.2 kg)   BMI Readings from Last 3 Encounters:  07/11/22 26.61 kg/m  06/28/22 26.19 kg/m  04/12/22 25.76 kg/m    Allergies  Allergen Reactions   Oxycodone Other (See Comments)    Gives patient nightmares   Rifampin Nausea Only    Medications Reviewed Today     Reviewed by Maren Reamer, RPH (Pharmacist) on 10/02/22 at 1207  Med List Status: <None>   Medication Order Taking? Sig Documenting Provider Last Dose Status Informant  acetaminophen (TYLENOL) 325 MG tablet [id number] No Take 325 mg by mouth 2 (two) times daily as needed for moderate pain or headache. [provider] Taking Active Spouse/Significant Other  ARIPiprazole (ABILIFY) 5 MG tablet [id number] No Take 5 mg by mouth daily. [provider] Taking Active   aspirin EC 81 MG tablet [id number] No Take 81 mg by mouth at bedtime.  [provider] Taking Active Spouse/Significant Other  atorvastatin (LIPITOR) 40 MG tablet [id number] No TAKE 1 TABLET BY MOUTH EVERY DAY Burchette, Alinda Sierras, MD Taking Active   carvedilol (COREG) 6.25 MG tablet [id number]  TAKE 1 TABLET BY MOUTH 2 TIMES DAILY WITH A MEAL. Bensimhon, Shaune Pascal, MD  Active   clonazePAM (KLONOPIN) 1 MG tablet [id number]  TAKE 1 TABLET BY MOUTH TWICE A DAY *NOT FOR REGULAR USE* Isaac Bliss, Rayford Halsted, MD  Active   docusate sodium (COLACE) 100 MG capsule [id number] No Take 100 mg by mouth 2 (two) times daily. [provider] Taking Active Spouse/Significant Other  Ensure (ENSURE) [id number] No Take 237 mLs by mouth daily as needed. [name], MD Taking Active   FARXIGA 10 MG TABS tablet [id number] No TAKE 1 TABLET BY MOUTH EVERY DAY Bensimhon, Shaune Pascal, MD Taking Active   furosemide (LASIX) 20 MG tablet [id number] No TAKE 1 TABLET (20 MG TOTAL)  BY MOUTH 4 (FOUR) TIMES A WEEK. Bensimhon, Shaune Pascal, MD Taking Active   levothyroxine (SYNTHROID) 150 MCG tablet [id number] No Take 1 tablet (150 mcg total) by mouth daily. [name], MD Taking Active  mexiletine (MEXITIL) 150 MG capsule [id number] No TAKE 2 CAPSULES BY MOUTH EVERY MORNING AND 2 CAPSULES BY MOUTH EVERY EVENING. Bensimhon, Shaune Pascal, MD Taking Active   ranolazine (RANEXA) 500 MG 12 hr tablet [id number]  TAKE 1 TABLET BY MOUTH TWICE A DAY Deboraha Sprang, MD  Active   sacubitril-valsartan (ENTRESTO) 97-103 MG [id number]  Take 1 tablet by mouth 2 (two) times daily. Bensimhon, Shaune Pascal, MD  Active   Salicylic Acid (WART REMOVER EX) [id number] No Apply 1 application topically daily as needed (foot corns). [provider] Taking Active Spouse/Significant Other  sertraline (ZOLOFT) 50 MG tablet [id number]  TAKE 1 TABLET BY MOUTH EVERY DAY Burchette, Alinda Sierras, MD  Active   Med List Note Otilio Miu, [place] 02/03/22 0900): 3            SDOH:  (Social Determinants of Health) assessments and interventions performed: Yes SDOH Interventions    [place] Coordination from 10/02/2022 in [place] Telephone from 09/06/2022 in [place] Management from 04/05/2022 in [place] at [place] from 10/17/2021 in [place] at [place] from 10/22/2020 in [place] at [facility] Management from 12/24/2019 in [place] at [place] Interventions -- Intervention Not Indicated -- Intervention Not Indicated -- --  Housing Interventions Intervention Not Indicated Intervention Not Indicated -- Intervention Not Indicated -- --  Transportation Interventions -- -- -- Intervention Not Indicated -- Intervention Not Indicated  Utilities Interventions  Intervention Not Indicated -- -- -- -- --  Depression Interventions/Treatment  -- -- -- -- Currently on Treatment --  Financial Strain Interventions -- -- Intervention Not Indicated Intervention Not Indicated -- Intervention Not Indicated  Physical Activity Interventions -- -- -- Intervention Not Indicated -- --  Stress Interventions -- -- -- Intervention Not Indicated -- --  Social Connections Interventions -- -- -- Intervention Not Indicated Intervention Not Indicated --       Medication Assistance: None required.  Patient affirms current coverage meets needs.  Medication Access: Within the past 30 days, how often has patient missed a dose of medication? None Is a pillbox or other method used to improve adherence? Yes  Factors that may affect medication adherence? no barriers identified Are meds synced by current pharmacy? No  Are meds delivered by current pharmacy? No  Does patient experience delays in picking up medications due to transportation concerns? No   [facility] Reviewed: Is patient disadvantaged to use [facility]?: Yes  Current Rx insurance plan: [facility] Name and location of Current pharmacy:  [id number] [place], [place] - [address] [place] [address]Phone: [phone]: [address] [address]Phone: [phone]: [phone] [facility] services reviewed with patient today?: No  Patient requests to transfer care to [facility]?: No  Reason patient declined to change pharmacies: Disadvantaged due to insurance/mail order  Compliance/Adherence/Medication fill history: Care Gaps: AWV - completed 10/17/2021, scheduled 10/19/2022 Last BP - 134/86 on 07/11/2022 Shingrix - never done Covid - overdue Colonoscopy - overdue HIV Screen -  postponed  Star-Rating Drugs: Atorvastatin '40mg'$  PDC 50% Farxiga '10mg'$  PDC 100% Entresto 97-'103mg'$  PDC 100%   Assessment/Plan  Heart Failure (Goal: manage symptoms and prevent exacerbations) -Controlled -Last ejection fraction: 25-30% (Date: 06/28/22) -HF type: Combined systolic and diastolic -NYHA Class: III (marked limitation of activity) -AHA HF Stage: C (Heart disease and symptoms present) -Current treatment: Farxiga '10mg'$  qd Appropriate, Effective, Safe, Accessible Entresto 97-'103mg'$  BID Appropriate, Effective, Safe, Accessible Furosemide '20mg'$  4x/week Appropriate, Effective, Safe, Accessible Carvedilol 6.'25mg'$  BID Appropriate, Effective, Safe, Accessible -Medications previously tried: Lisinopril, Metoprolol, Spironolcatone -Current home BP/HR readings: checking daily, driving during appt so no log but avg <140/90 -Current home daily weights: no gain >3lbs/day or >5lb/week to report, checking daily -Current dietary habits: mindful of salt intake -Current exercise habits: not discussed -Cannot maximize carvedilol dose due to hypotension/low HR -Educated on Benefits of medications for managing symptoms and prolonging life Importance of weighing daily; if you gain more than 3 pounds in one day or 5 pounds in one week, notify cardiologist or PCP, take lasix per cardio instructions Proper diuretic administration and potassium supplementation Importance of blood pressure control -Counseled on diet and exercise extensively Recommended to continue current medication  Depression/Anxiety (Goal: Stabilized mood that allows for ADLs) -Controlled -Current treatment: Sertraline '50mg'$  1 qd Appropriate, Effective, Safe, Accessible Clonazepam '1mg'$  1 BID Appropriate, Effective, Safe, Accessible Abilify '5mg'$  Appropriate, Effective, Safe, Accessible -Medications previously tried/failed: None -PHQ9: 10 04/05/22 -GAD7: Not performed today -Educated on Benefits of medication for symptom  control Benefits of cognitive-behavioral therapy with or without medication -Recommended to continue current medication   [place] Pharmacist [phone]

## 2022-10-02 ENCOUNTER — Ambulatory Visit: Payer: 59

## 2022-10-11 ENCOUNTER — Telehealth: Payer: Self-pay | Admitting: Family Medicine

## 2022-10-11 NOTE — Telephone Encounter (Signed)
Contacted [name] to schedule their annual wellness visit. Appointment made for 03/21/23.  Barkley Boards AWV direct phone # [phone]   Left message and also sent my chart message  That appointment 10/19/22 has been changed to phone appt @ 340.

## 2022-10-19 ENCOUNTER — Ambulatory Visit (INDEPENDENT_AMBULATORY_CARE_PROVIDER_SITE_OTHER): Payer: 59 | Admitting: Family Medicine

## 2022-10-19 DIAGNOSIS — Z0000 Encounter for general adult medical examination without abnormal findings: Secondary | ICD-10-CM | POA: Diagnosis not present

## 2022-10-19 NOTE — Progress Notes (Signed)
No PATIENT CHECK-IN and HEALTH RISK ASSESSMENT QUESTIONNAIRE:  -completed by phone/video for upcoming Medicare Preventive Visit  Pre-Visit Check-in: 1)Vitals (height, wt, BP, etc) - record in vitals section for visit on day of visit 2)Review and Update Medications, Allergies PMH, Surgeries, Social history in Epic 3)Hospitalizations in the last year with date/reason? No   4)Review and Update Care Team (patient's specialists) in Epic 5) Complete PHQ9 in Epic  6) Complete Fall Screening in Epic 7)Review all Health Maintenance Due and order under PCP if not done.  Medicare Wellness Patient Questionnaire:  Answer theses question about your habits: Do you drink alcohol? No    If yes, how many drinks do you have a day? N/A Have you ever smoked?No  Quit date if applicable?  N/A How many packs a day do/did you smoke? No  Do you use smokeless tobacco?No Do you use an illicit drugs?No  Do you exercises? No   IF so, what type and how many days/minutes per week?N/A Are you sexually active? Yes    Number of partners? 1 Typical breakfast: pancake, toast Typical lunch- sandwich, soup Typical dinner:-meatloaf, hamburgers, spaghetti, vegetable Typical snacks:cookies  Beverages: body armour, water, sweet tea  Answer theses question about you: Can you perform most household chores?Yes, it takes a while to perform Do you find it hard to follow a conversation in a noisy room?No  Do you often ask people to speak up or repeat themselves?No  Do you feel that you have a problem with memory?No  Do you balance your checkbook and or bank acounts?wife handles  Do you feel safe at home?Yes Last dentist visit?5 years ago Do you need assistance with any of the following: Please note if so   Driving?-No   Feeding yourself?-No   Getting from bed to chair?-Yes, sometimes  Getting to the toilet?-No   Bathing or showering?-No  Dressing yourself?-Yes, sometimes  Managing money?-Wife handles  Climbing a flight  of stairs-No   Preparing meals?-Wife handles     Do you have Advanced Directives in place (Living Will, Healthcare Power or Attorney)? Yes    Last eye Exam and location? 6 months ago, patient does not remember what office   Do you currently use prescribed or non-prescribed narcotic or opioid pain medications?No   Do you have a history or close family history of breast, ovarian, tubal or peritoneal cancer or a family member with BRCA (breast cancer susceptibility 1 and 2) gene mutations?  Nurse/Assistant Credentials/time stamp:  st   ----------------------------------------------------------------------------------------------------------------------------------------------------------------------------------------------------------------------    [name] (Welcome to Medicare, initial annual wellness or annual wellness exam)  Virtual Visit via Phone Note  I connected with Bradley [name] on 10/19/22  by phone and verified that I am speaking with the correct person using two identifiers.  Location patient: home Location provider:work or home office Persons participating in the virtual visit: patient, provider  Concerns and/or follow up today:    See HM section in Epic for other details of completed HM.    ROS: negative for report of fevers, unintentional weight loss, vision changes, vision loss, hearing loss or change, chest pain, sob, hemoptysis, melena, hematochezia, hematuria, bleeding or bruising, loc, thoughts of suicide or self harm, memory loss  Patient-completed extensive health risk assessment - reviewed and discussed with the patient: See Health Risk Assessment completed with patient prior to the visit either above or in recent phone note. This was reviewed in detailed with the patient today and appropriate recommendations, orders  and referrals were placed as needed per Summary below and patient instructions.   Review of  Medical History: -PMH, [place], Family History and current specialty and care providers reviewed and updated and listed below   Patient Care Team: [name], MD as PCP - General Fay Records, MD as PCP - Cardiology (Cardiology) Deboraha Sprang, MD as PCP - Electrophysiology (Cardiology) Bensimhon, Shaune Pascal, MD as PCP - Advanced Heart Failure (Cardiology) [name], MD (Inactive) as Consulting Physician (Cardiothoracic Surgery) Deterding, Jeneen Rinks, MD as Consulting Physician (Nephrology) Deboraha Sprang, MD as Consulting Physician (Cardiology)   Past Medical History:  Diagnosis Date   Anxiety    Aortic stenosis    s/p Bentall with bioprosthetic AVR 02/2010; Last echo (9/11): Moderate LVH, EF 45-50%, AVR functioning appropriately, aortic valve mean gradient 21, diastolic dysfunction. Chest MRA (2/13): Mild to moderate dilatation at the sinus of Valsalva at 4.1 cm, mild dilatation ascending aorta distal to the tube graft at 3.9 cm, moderate dilatation of the innominate artery a 2.1 cm;     CHF (congestive heart failure) ([place])    Depression    ESRD on dialysis ([place])    a. 09/2013 felt to be related to gentamycin b. no longer requiring HD   Hx of cardiac cath    a. LHC in 02/2010: normal cors   Hx of echocardiogram 02/2014   Echo (8/15):  Mod LV, EF 35-40%, Gr 1 DD, AVR ok (mean 14 mmHg), mild LAE   Hypertension    Hypothyroidism, postsurgical    Prosthetic valve endocarditis (HCC)    Staphylococcus aureus bacteremia    Thyroid cancer ([place])    Ventricular tachycardia ([place])    a. s/p STJ ICD    Past Surgical History:  Procedure Laterality Date   AORTIC VALVE REPAIR  1968   AORTIC VALVE REPLACEMENT  2011   AV FISTULA PLACEMENT Left 10/08/2013   Procedure: ARTERIOVENOUS (AV) FISTULA CREATION- LEFT ARM; Radial Cephalic ;  Surgeon: Mal [name], MD;  Location: [facility];  Service: Vascular;  Laterality: Left;   BENTALL PROCEDURE N/A 10/28/2013   Procedure: REDO BENTALL PROCEDURE,  debridment of aoritc root abscess, replacement of aortic root, ascending aorta and aortic valve with homograft. Insertion of left femoral arterial line;  Surgeon: [name], MD;  Location: [facility];  Service: Open Heart Surgery;  Laterality: N/A;   BIOPSY  07/22/2019   Procedure: BIOPSY;  Surgeon: Doran Stabler, MD;  Location: [facility];  Service: Gastroenterology;;   CARDIAC CATHETERIZATION  03/02/2010   NORMAL CORONARY ARTERY   CARDIAC CATHETERIZATION N/A 01/01/2015   Procedure: Coronary/Graft Angiography;  Surgeon: Troy Sine, MD;  Location: [facility];  Service: Cardiovascular;  Laterality: N/A;   CARDIAC VALVE REPLACEMENT     COLONOSCOPY WITH PROPOFOL N/A 07/22/2019   Procedure: COLONOSCOPY WITH PROPOFOL;  Surgeon: Doran Stabler, MD;  Location: [facility];  Service: Gastroenterology;  Laterality: N/A;   EP IMPLANTABLE DEVICE N/A 01/01/2015   Procedure: ICD Implant;  Surgeon: Deboraha Sprang, MD;  Location: [facility];  Service: Cardiovascular;  Laterality: N/A;   HEMOSTASIS CLIP PLACEMENT  07/22/2019   Procedure: HEMOSTASIS CLIP PLACEMENT;  Surgeon: Doran Stabler, MD;  Location: [facility];  Service: Gastroenterology;;   INTRAOPERATIVE TRANSESOPHAGEAL ECHOCARDIOGRAM N/A 10/28/2013   Procedure: INTRAOPERATIVE TRANSESOPHAGEAL ECHOCARDIOGRAM;  Surgeon: [name], MD;  Location: [facility];  Service: Open Heart Surgery;  Laterality: N/A;   PERIPHERAL VASCULAR  CATHETERIZATION N/A 01/01/2015   Procedure: Aortic Arch Angiography;  Surgeon: Troy Sine, MD;  Location: [facility];  Service: Cardiovascular;  Laterality: N/A;   POLYPECTOMY  07/22/2019   Procedure: POLYPECTOMY;  Surgeon: Doran Stabler, MD;  Location: [facility];  Service: Gastroenterology;;   SHOULDER ARTHROSCOPY W/ ROTATOR CUFF REPAIR Right 2012   STERNOTOMY     REDO   TEE WITHOUT CARDIOVERSION N/A 10/24/2013   Procedure: TRANSESOPHAGEAL ECHOCARDIOGRAM (TEE);  Surgeon:  Dorothy Spark, MD;  Location: [facility];  Service: Cardiovascular;  Laterality: N/A;   THYROIDECTOMY  ~ 2005   TRANSTHORACIC ECHOCARDIOGRAM  03/2010   SHOWED MILD REDUCTION OF LV FUNCTION    Social History   Socioeconomic History   Marital status: Married    Spouse name: Not on file   Number of children: Not on file   Years of education: Not on file   Highest education level: Not on file  Occupational History   Not on file  Tobacco Use   Smoking status: Never   Smokeless tobacco: Never  Vaping Use   Vaping Use: Never used  Substance and Sexual Activity   Alcohol use: No    Alcohol/week: 0.0 standard drinks of alcohol    Comment: Occasional   Drug use: No   Sexual activity: Never  Other Topics Concern   Not on file  Social History Narrative   ** Merged History Encounter **       Lives at home with walker.  Does not drive.  ESRD T, H, Sa.  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   Social Determinants of Health   Financial Resource Strain: Low Risk  (04/05/2022)   Overall Financial Resource Strain (CARDIA)    Difficulty of Paying Living Expenses: Not very hard  Food Insecurity: No Food Insecurity (10/02/2022)   Hunger Vital Sign    Worried About Running Out of Food in the Last Year: Never true    Ran Out of Food in the Last Year: Never true  Transportation Needs: No Transportation Needs (10/17/2021)   PRAPARE - Hydrologist (Medical): No    Lack of Transportation (Non-Medical): No  Physical Activity: Inactive (10/17/2021)   Exercise Vital Sign    Days of Exercise per Week: 0 days    Minutes of Exercise per Session: 0 min  Stress: No Stress Concern Present (10/17/2021)   [place]    Feeling of Stress : Not at all  Social Connections: Moderately Isolated (10/17/2021)   Social Connection and Isolation Panel [NHANES]    Frequency of Communication with Friends and Family: More than  three times a week    Frequency of Social Gatherings with Friends and Family: More than three times a week    Attends Religious Services: Never    Marine scientist or Organizations: No    Attends Music therapist: Not on file    Marital Status: Married  Human resources officer Violence: Not At Risk (10/17/2021)   Humiliation, Afraid, Rape, and Kick questionnaire    Fear of Current or Ex-Partner: No    Emotionally Abused: No    Physically Abused: No    Sexually Abused: No    Family History  Problem Relation Age of Onset   Hypertension Mother    Heart disease Father    Stroke Father    Hypertension Father    Thyroid cancer Sister    Thyroid cancer  Sister    Kidney Stones Sister    Heart attack Neg Hx    Colon cancer Neg Hx    Colon polyps Neg Hx    Esophageal cancer Neg Hx    Stomach cancer Neg Hx    Rectal cancer Neg Hx     Current Outpatient Medications on File Prior to Visit  Medication Sig Dispense Refill   acetaminophen (TYLENOL) 325 MG tablet Take 325 mg by mouth 2 (two) times daily as needed for moderate pain or headache.     ARIPiprazole (ABILIFY) 5 MG tablet Take 5 mg by mouth daily.     aspirin EC 81 MG tablet Take 81 mg by mouth at bedtime.      atorvastatin (LIPITOR) 40 MG tablet TAKE 1 TABLET BY MOUTH EVERY DAY 90 tablet 3   carvedilol (COREG) 6.25 MG tablet TAKE 1 TABLET BY MOUTH 2 TIMES DAILY WITH A MEAL. 180 tablet 0   clonazePAM (KLONOPIN) 1 MG tablet TAKE 1 TABLET BY MOUTH TWICE A DAY *NOT FOR REGULAR USE* 30 tablet 0   docusate sodium (COLACE) 100 MG capsule Take 100 mg by mouth 2 (two) times daily.     Ensure (ENSURE) Take 237 mLs by mouth daily as needed. 237 mL 12   FARXIGA 10 MG TABS tablet TAKE 1 TABLET BY MOUTH EVERY DAY 30 tablet 11   furosemide (LASIX) 20 MG tablet TAKE 1 TABLET (20 MG TOTAL) BY MOUTH 4 (FOUR) TIMES A WEEK. 48 tablet 3   levothyroxine (SYNTHROID) 150 MCG tablet Take 1 tablet (150 mcg total) by mouth daily. 90 tablet 3    mexiletine (MEXITIL) 150 MG capsule TAKE 2 CAPSULES BY MOUTH EVERY MORNING AND 2 CAPSULES BY MOUTH EVERY EVENING. 360 capsule 3   ranolazine (RANEXA) 500 MG 12 hr tablet TAKE 1 TABLET BY MOUTH TWICE A DAY 180 tablet 3   sacubitril-valsartan (ENTRESTO) 97-103 MG Take 1 tablet by mouth 2 (two) times daily. 99991111 tablet 1   Salicylic Acid (WART REMOVER EX) Apply 1 application topically daily as needed (foot corns).     sertraline (ZOLOFT) 50 MG tablet TAKE 1 TABLET BY MOUTH EVERY DAY 90 tablet 2   No current facility-administered medications on file prior to visit.    Allergies  Allergen Reactions   Oxycodone Other (See Comments)    Gives patient nightmares   Rifampin Nausea Only       Physical Exam There were no vitals filed for this visit. Estimated body mass index is 26.61 kg/m as calculated from the following:   Height as of 07/11/22: 6' (1.829 m).   Weight as of 07/11/22: 196 lb 3.2 oz (89 kg).  EKG (optional): deferred due to virtual visit  GENERAL: alert, oriented, no acute distress detected; full vision exam deferred due to pandemic and/or virtual encounter   PSYCH/NEURO: pleasant and cooperative, no obvious depression or anxiety, speech and thought processing grossly intact, Cognitive function grossly intact  [facility] Office Visit from 10/19/2022 in [place] at [facility]  PHQ-9 Total Score 3           10/19/2022    3:09 PM 09/06/2022   12:06 PM 04/12/2022   10:14 AM 10/17/2021    3:03 PM 10/22/2020    2:08 PM  Depression screen PHQ 2/9  Decreased Interest 1 0 2 0 2  Down, Depressed, Hopeless 0 0 0 0 1  PHQ - 2 Score 1 0 2 0 3  Altered sleeping 0  2  3  Tired, decreased energy 1  2  1   Change in appetite 0  0  1  Feeling bad or failure about yourself  0  0  1  Trouble concentrating 0  0  0  Moving slowly or fidgety/restless 1  1  1   Suicidal thoughts 0  0  0  PHQ-9 Score 3  7  10   Difficult doing work/chores Not difficult at all  Not  difficult at all  Somewhat difficult  Mood has improved!     11/15/2019   12:24 AM 10/22/2020    2:07 PM 09/16/2021   11:20 AM 10/17/2021    3:06 PM 10/19/2022    3:09 PM  Fall Risk  Falls in the past year?  1  1 1   Was there an injury with Fall?  0  0 0  Was there an injury with Fall? - Comments    No injury or medical attention needed   Fall Risk Category Calculator  2  1 2   Fall Risk Category (Retired)  Moderate  Low   (RETIRED) Patient Fall Risk Level Low fall risk Low fall risk Low fall risk Low fall risk   Patient at Risk for Falls Due to  Impaired balance/gait  No Fall Risks   Fall risk Follow up  Falls evaluation completed;Falls prevention discussed      Tripped outside.   SUMMARY AND PLAN:  Encounter for Medicare annual wellness exam   Discussed applicable health maintenance/preventive health measures and advised and referred or ordered per patient preferences:  Health Maintenance  Topic Date Due   HIV Screening  Never done,advised if wishes to do can do with labs   Zoster Vaccines- Shingrix (1 of 2) Never done, discussed, he is considering and can get at pharmacy   COVID-19 Vaccine (5 - 2023-24 season) 03/31/2022, he had latest booster in the fall   COLONOSCOPY (Pts 45-68yrs Insurance coverage will need to be confirmed)  07/21/2022 - reviewed letter from GI office with him and advised to contact GI - provided number for him to call and he agrees to call and schedule.    Medicare Annual Wellness (AWV)  10/19/2023   DTaP/Tdap/Td (3 - Td or Tdap) 03/06/2026   Pneumonia Vaccine 39+ Years old  Completed   INFLUENZA VACCINE  Completed   Hepatitis C Screening  Completed   HPV VACCINES  Aged Out   Did PSA with PCP  Education and counseling on the following was provided based on the above review of health and a plan/checklist for the patient, along with additional information discussed, was provided for the patient in the patient instructions :   -Provided counseling and  plan for increased risk of falling if applicable per above screening. Provided safe balance exercises he can do at home.  -Advised and counseled on maintaining healthy weight and healthy lifestyle - including the importance of a healthy diet, regular physical activity, social connections and stress management. -Advised and counseled on a whole foods based healthy die. A summary of a healthy diet was provided in the Patient Instructions. -Discussed exercise guidelines. He is seeing his cardiologist soon and advised before embarking on change in exercise that he first consult with his cardiologist. Discussed options for at home and within the community once he gets ok from his cardiologist. -Advise yearly dental visits at minimum and regular eye exams   Follow up: see patient instructions   Patient Instructions  I really enjoyed getting to talk with you today!  I am available on Tuesdays and Thursdays for virtual visits if you have any questions or concerns, or if I can be of any further assistance.   CHECKLIST FROM ANNUAL WELLNESS VISIT:  -Follow up (please call to schedule if not scheduled after visit):  -Inperson visit with your Primary Doctor office: -yearly for annual wellness visit with primary care office  Here is a list of your preventive care/health maintenance measures and the plan for each if any are due:  Health Maintenance  Topic Date Due   HIV Screening  Never done - can request with labs at next visit if you wish   Zoster Vaccines- Shingrix (1 of 2) Never done - can get at the pharmacy   COVID-19 Vaccine (5 - 2023-24 season) 03/31/2022, can get at the pharmacy   COLONOSCOPY (Pts 45-60yrs Insurance coverage will need to be confirmed)  07/21/2022 please call to schedule with your gastroenterology office [phone]   Medicare Annual Wellness (AWV)  10/19/2023   DTaP/Tdap/Td (3 - Td or Tdap) 03/06/2026   Pneumonia Vaccine 63+ Years old  Completed   INFLUENZA VACCINE  Completed    Hepatitis C Screening  Completed   HPV VACCINES  Aged Out    -See a dentist at least yearly  -Get your eyes checked and then per your eye specialist's recommendations  -Other issues addressed today:   -I have included below further information regarding a healthy whole foods based diet, physical activity guidelines for adults, stress management and opportunities for social connections. I hope you find this information useful.   -----------------------------------------------------------------------------------------------------------------------------------------------------------------------------------------------------------------------------------------------------------  NUTRITION: -eat real food: lots of colorful vegetables (half the plate) and fruits -5-7 servings of vegetables and fruits per day (fresh or steamed is best), exp. 2 servings of vegetables with lunch and dinner and 2 servings of fruit per day. Berries and greens such as kale and collards are great choices.  -consume on a regular basis: whole grains (make sure first ingredient on label contains the word "whole"), fresh fruits, fish, nuts, seeds, healthy oils (such as olive oil, avocado oil, grape seed oil) -may eat small amounts of dairy and lean meat on occasion, but avoid processed meats such as ham, bacon, lunch meat, etc. -drink water -try to avoid fast food and pre-packaged foods, processed meat -most experts advise limiting sodium to < 2300mg  per day, should limit further is any chronic conditions such as high blood pressure, heart disease, diabetes, etc. The American Heart Association advised that < 1500mg  is is ideal -try to avoid foods that contain any ingredients with names you do not recognize  -try to avoid sugar/sweets (except for the natural sugar that occurs in fresh fruit) -try to avoid sweet drinks -try to avoid white rice, white bread, pasta (unless whole grain), white or yellow potatoes  EXERCISE  GUIDELINES FOR ADULTS: -if you wish to increase your physical activity, do so gradually and with the approval of your heart doctor -STOP and seek medical care immediately if you have any chest pain, chest discomfort or trouble breathing when starting or increasing exercise  -move and stretch your body, legs, feet and arms when sitting for long periods -Physical activity guidelines for optimal health in adults: -least 150 minutes per week of aerobic exercise (can talk, but not sing) once approved by your doctor, 20-30 minutes of sustained activity or two 10 minute episodes of sustained activity every day.  -resistance training at least 2 days per week if approved by your doctor -balance exercises 3+ days per  week:   Stand somewhere where you have something sturdy to hold onto if you lose balance.    1) lift up on toes, start with 5x per day and work up to 20x   2) stand and lift on leg straight out to the side so that foot is a few inches of the floor, start with 5x each side and work up to 20x each side   3) stand on one foot, start with 5 seconds each side and work up to 20 seconds on each side  If you need ideas or help with getting more active:  -Silver sneakers https://tools.silversneakers.com  -Walk with a Doc: http://stephens-thompson.biz/  -try to include resistance (weight lifting/strength building) and balance exercises twice per week: or the following link for ideas: ChessContest.fr  UpdateClothing.com.cy  STRESS MANAGEMENT: -can try meditating, or just sitting quietly with deep breathing while intentionally relaxing all parts of your body for 5 minutes daily -if you need further help with stress, anxiety or depression please follow up with your primary doctor or contact the wonderful folks at [place]: [place]: -options in [place] if you wish to  engage in more social and exercise related activities:  -Silver sneakers https://tools.silversneakers.com  -Walk with a Doc: http://stephens-thompson.biz/  -Check out the [place] 50+ section on the [employer] (hiking clubs, book clubs, cards and games, chess, exercise classes, aquatic classes and much more) - see the website for details: https://www.[place].gov/departments/parks-recreation/active-adults50  -YouTube has lots of exercise videos for different ages and abilities as well  -[place] (a variety of indoor and outdoor inperson activities for adults). 901-670-2746. [address]  -Virtual Online Classes (a variety of topics): see seniorplanet.org or call (825)543-4573  -consider volunteering at a school, hospice center, church, senior center or elsewhere           Lucretia Kern, DO

## 2022-10-19 NOTE — Patient Instructions (Signed)
I really enjoyed getting to talk with you today! I am available on Tuesdays and Thursdays for virtual visits if you have any questions or concerns, or if I can be of any further assistance.   CHECKLIST FROM ANNUAL WELLNESS VISIT:  -Follow up (please call to schedule if not scheduled after visit):  -Inperson visit with your Primary Doctor office: -yearly for annual wellness visit with primary care office  Here is a list of your preventive care/health maintenance measures and the plan for each if any are due:  Health Maintenance  Topic Date Due   HIV Screening  Never done - can request with labs at next visit if you wish   Zoster Vaccines- Shingrix (1 of 2) Never done - can get at the pharmacy   COVID-19 Vaccine (5 - 2023-24 season) 03/31/2022, can get at the pharmacy   COLONOSCOPY (Pts 45-60yrs Insurance coverage will need to be confirmed)  07/21/2022 please call to schedule with your gastroenterology office [phone]   Medicare Annual Wellness (AWV)  10/19/2023   DTaP/Tdap/Td (3 - Td or Tdap) 03/06/2026   Pneumonia Vaccine 31+ Years old  Completed   INFLUENZA VACCINE  Completed   Hepatitis C Screening  Completed   HPV VACCINES  Aged Out    -See a dentist at least yearly  -Get your eyes checked and then per your eye specialist's recommendations  -Other issues addressed today:   -I have included below further information regarding a healthy whole foods based diet, physical activity guidelines for adults, stress management and opportunities for social connections. I hope you find this information useful.   -----------------------------------------------------------------------------------------------------------------------------------------------------------------------------------------------------------------------------------------------------------  NUTRITION: -eat real food: lots of colorful vegetables (half the plate) and fruits -5-7 servings of vegetables and fruits per  day (fresh or steamed is best), exp. 2 servings of vegetables with lunch and dinner and 2 servings of fruit per day. Berries and greens such as kale and collards are great choices.  -consume on a regular basis: whole grains (make sure first ingredient on label contains the word "whole"), fresh fruits, fish, nuts, seeds, healthy oils (such as olive oil, avocado oil, grape seed oil) -may eat small amounts of dairy and lean meat on occasion, but avoid processed meats such as ham, bacon, lunch meat, etc. -drink water -try to avoid fast food and pre-packaged foods, processed meat -most experts advise limiting sodium to < 2300mg  per day, should limit further is any chronic conditions such as high blood pressure, heart disease, diabetes, etc. The American Heart Association advised that < 1500mg  is is ideal -try to avoid foods that contain any ingredients with names you do not recognize  -try to avoid sugar/sweets (except for the natural sugar that occurs in fresh fruit) -try to avoid sweet drinks -try to avoid white rice, white bread, pasta (unless whole grain), white or yellow potatoes  EXERCISE GUIDELINES FOR ADULTS: -if you wish to increase your physical activity, do so gradually and with the approval of your heart doctor -STOP and seek medical care immediately if you have any chest pain, chest discomfort or trouble breathing when starting or increasing exercise  -move and stretch your body, legs, feet and arms when sitting for long periods -Physical activity guidelines for optimal health in adults: -least 150 minutes per week of aerobic exercise (can talk, but not sing) once approved by your doctor, 20-30 minutes of sustained activity or two 10 minute episodes of sustained activity every day.  -resistance training at least 2 days per week if approved  by your doctor -balance exercises 3+ days per week:   Stand somewhere where you have something sturdy to hold onto if you lose balance.    1) lift up  on toes, start with 5x per day and work up to 20x   2) stand and lift on leg straight out to the side so that foot is a few inches of the floor, start with 5x each side and work up to 20x each side   3) stand on one foot, start with 5 seconds each side and work up to 20 seconds on each side  If you need ideas or help with getting more active:  -Silver sneakers https://tools.silversneakers.com  -Walk with a Doc: http://stephens-thompson.biz/  -try to include resistance (weight lifting/strength building) and balance exercises twice per week: or the following link for ideas: ChessContest.fr  UpdateClothing.com.cy  STRESS MANAGEMENT: -can try meditating, or just sitting quietly with deep breathing while intentionally relaxing all parts of your body for 5 minutes daily -if you need further help with stress, anxiety or depression please follow up with your primary doctor or contact the wonderful folks at [place]: [place]: -options in [place] if you wish to engage in more social and exercise related activities:  -Silver sneakers https://tools.silversneakers.com  -Walk with a Doc: http://stephens-thompson.biz/  -Check out the [place] 50+ section on the [place] [employer] (hiking clubs, book clubs, cards and games, chess, exercise classes, aquatic classes and much more) - see the website for details: https://www.[place].gov/departments/parks-recreation/active-adults50  -YouTube has lots of exercise videos for different ages and abilities as well  -[place] (a variety of indoor and outdoor inperson activities for adults). [phone]. [address]  -Virtual Online Classes (a variety of topics): see seniorplanet.org or call [phone]  -consider volunteering at a school, hospice center, church, senior  center or elsewhere

## 2022-10-22 NOTE — Progress Notes (Signed)
Heart Failure Clinic Note  Date:  10/23/2022   ID:  Bradley [name], DOB Mar 09, 1956, MRN [id number]  Location: Home  Provider location: [facility] Type of Visit: Established patient  PCP:  [name], MD  Cardiologist:  Dorris Carnes, MD Primary HF: Ollin Hochmuth  Chief Complaint: Heart Failure follow-up   History of Present Illness:  Bradley [name] is a 67 y.o. y/o male with a complicated past medical history including thyroid cancer (s/p thyroidectomy), HTN, depression, and congenital AS s/p aortic vavulotomy at age 23 with subsequent Bentall procedure in 2001 with a pericardial tissue valve.  He had MSSA bacteremia and bacterial endocarditis in [id number] complicated by renal failure requiring dialysis for a short period of time.  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     Admitted 11/2014 with monomorphic VT. Cath with normal coronaries.  Echo EF 30-35% -> STJ dual chamber ICD implant  Echo 9/21 EF 35-40% Echo 11/23 EF 25-30%. RV mildly down. AVR ok  Today he returns for HF follow up with his wife. Doing well. Denies CP or SOB. Not very active. Depression up and down. No edema, orthopnea or PND. Compliant with meds. Overall feels like he is doing ok. \  Corvue: Unable to transmit   Studies: Echo Ef 35-40%  Echo 07/2019 30-35%  Echo 7/19  30-35% AVR stable. Personally reviewed Echo 5/18 EF 25-30%    CPX 01/01/17  FEV1 3.40 (86%)        FEV1/FVC 83 (107%)        MVV 163 (108%) Resting HR: 72 Peak HR: 129   (80% age predicted max HR) BP rest: 118/70 BP peak: 174/84  Peak VO2: 21.1 (73% predicted peak VO2) VE/VCO2 slope:  35 OUES: 1.96 Peak RER:  1.22 Ventilatory Threshold: 16.6 (58% predicted or measured peak VO2) VE/MVV:  50% O2pulse:  15   (100% predicted O2pulse)     Past Medical History:   Diagnosis Date   Anxiety    Aortic stenosis    s/p Bentall with bioprosthetic AVR 02/2010; Last echo (9/11): Moderate LVH, EF 45-50%, AVR functioning appropriately, aortic valve mean gradient 21, diastolic dysfunction. Chest MRA (2/13): Mild to moderate dilatation at the sinus of Valsalva at 4.1 cm, mild dilatation ascending aorta distal to the tube graft at 3.9 cm, moderate dilatation of the innominate artery a 2.1 cm;     CHF (congestive heart failure) ([place])    Depression    ESRD on dialysis ([place])    a. 09/2013 felt to be related to gentamycin b. no longer requiring HD   Hx of cardiac cath    a. LHC in 02/2010: normal cors   Hx of echocardiogram 02/2014   Echo (8/15):  Mod LV, EF 35-40%, Gr 1 DD, AVR ok (mean 14 mmHg), mild LAE   Hypertension    Hypothyroidism, postsurgical    Prosthetic valve endocarditis (H[facility])    Staphylococcus aureus bacteremia    Thyroid cancer ([place])    Ventricular tachycardia ([place])    a. s/p STJ ICD   Past Surgical History:  Procedure Laterality Date   AORTIC VALVE REPAIR  1968   AORTIC VALVE REPLACEMENT  2011   AV FISTULA PLACEMENT Left 10/08/2013   Procedure: ARTERIOVENOUS (AV) FISTULA CREATION- LEFT ARM; Radial Cephalic ;  Surgeon: Mal [name], MD;  Location: [facility];  Service:  Vascular;  Laterality: Left;   BENTALL PROCEDURE N/A 10/28/2013   Procedure: REDO BENTALL PROCEDURE, debridment of aoritc root abscess, replacement of aortic root, ascending aorta and aortic valve with homograft. Insertion of left femoral arterial line;  Surgeon: [name], MD;  Location: [facility];  Service: Open Heart Surgery;  Laterality: N/A;   BIOPSY  07/22/2019   Procedure: BIOPSY;  Surgeon: Doran Stabler, MD;  Location: [facility];  Service: Gastroenterology;;   CARDIAC CATHETERIZATION  03/02/2010   NORMAL CORONARY ARTERY   CARDIAC CATHETERIZATION N/A 01/01/2015   Procedure: Coronary/Graft Angiography;  Surgeon: Troy Sine, MD;  Location: [facility];  Service:  Cardiovascular;  Laterality: N/A;   CARDIAC VALVE REPLACEMENT     COLONOSCOPY WITH PROPOFOL N/A 07/22/2019   Procedure: COLONOSCOPY WITH PROPOFOL;  Surgeon: Doran Stabler, MD;  Location: [facility];  Service: Gastroenterology;  Laterality: N/A;   EP IMPLANTABLE DEVICE N/A 01/01/2015   Procedure: ICD Implant;  Surgeon: Deboraha Sprang, MD;  Location: [facility];  Service: Cardiovascular;  Laterality: N/A;   HEMOSTASIS CLIP PLACEMENT  07/22/2019   Procedure: HEMOSTASIS CLIP PLACEMENT;  Surgeon: Doran Stabler, MD;  Location: [facility];  Service: Gastroenterology;;   INTRAOPERATIVE TRANSESOPHAGEAL ECHOCARDIOGRAM N/A 10/28/2013   Procedure: INTRAOPERATIVE TRANSESOPHAGEAL ECHOCARDIOGRAM;  Surgeon: [name], MD;  Location: [facility];  Service: Open Heart Surgery;  Laterality: N/A;   PERIPHERAL VASCULAR CATHETERIZATION N/A 01/01/2015   Procedure: Aortic Arch Angiography;  Surgeon: Troy Sine, MD;  Location: [facility];  Service: Cardiovascular;  Laterality: N/A;   POLYPECTOMY  07/22/2019   Procedure: POLYPECTOMY;  Surgeon: Doran Stabler, MD;  Location: [facility];  Service: Gastroenterology;;   SHOULDER ARTHROSCOPY W/ ROTATOR CUFF REPAIR Right 2012   STERNOTOMY     REDO   TEE WITHOUT CARDIOVERSION N/A 10/24/2013   Procedure: TRANSESOPHAGEAL ECHOCARDIOGRAM (TEE);  Surgeon: Dorothy Spark, MD;  Location: [facility];  Service: Cardiovascular;  Laterality: N/A;   THYROIDECTOMY  ~ 2005   TRANSTHORACIC ECHOCARDIOGRAM  03/2010   SHOWED MILD REDUCTION OF LV FUNCTION     Current Outpatient Medications  Medication Sig Dispense Refill   acetaminophen (TYLENOL) 325 MG tablet Take 325 mg by mouth 2 (two) times daily as needed for moderate pain or headache.     ARIPiprazole (ABILIFY) 5 MG tablet Take 5 mg by mouth daily.     aspirin EC 81 MG tablet Take 81 mg by mouth at bedtime.      atorvastatin (LIPITOR) 40 MG tablet TAKE 1 TABLET BY MOUTH EVERY DAY 90 tablet 3    carvedilol (COREG) 6.25 MG tablet TAKE 1 TABLET BY MOUTH 2 TIMES DAILY WITH A MEAL. 180 tablet 0   clonazePAM (KLONOPIN) 1 MG tablet TAKE 1 TABLET BY MOUTH TWICE A DAY *NOT FOR REGULAR USE* (Patient taking differently: Patient takes 1 tablet 3 times per week as needed.) 30 tablet 0   docusate sodium (COLACE) 100 MG capsule Take 100 mg by mouth daily.     Ensure (ENSURE) Take 237 mLs by mouth daily as needed. 237 mL 12   FARXIGA 10 MG TABS tablet TAKE 1 TABLET BY MOUTH EVERY DAY 30 tablet 11   furosemide (LASIX) 20 MG tablet TAKE 1 TABLET (20 MG TOTAL) BY MOUTH 4 (FOUR) TIMES A WEEK. 48 tablet 3   levothyroxine (SYNTHROID) 150 MCG tablet Take 1 tablet (150 mcg total) by mouth daily. 90 tablet 3  mexiletine (MEXITIL) 150 MG capsule TAKE 2 CAPSULES BY MOUTH EVERY MORNING AND 2 CAPSULES BY MOUTH EVERY EVENING. 360 capsule 3   ranolazine (RANEXA) 500 MG 12 hr tablet TAKE 1 TABLET BY MOUTH TWICE A DAY 180 tablet 3   sacubitril-valsartan (ENTRESTO) 97-103 MG Take 1 tablet by mouth 2 (two) times daily. 99991111 tablet 1   Salicylic Acid (WART REMOVER EX) Apply 1 application topically daily as needed (foot corns).     sertraline (ZOLOFT) 50 MG tablet TAKE 1 TABLET BY MOUTH EVERY DAY 90 tablet 2   No current facility-administered medications for this encounter.    Allergies:   Oxycodone and Rifampin   Social History:  The patient  reports that he has never smoked. He has never used smokeless tobacco. He reports that he does not drink alcohol and does not use drugs.   Family History:  The patient's family history includes Heart disease in his father; Hypertension in his father and mother; Kidney Stones in his sister; Stroke in his father; Thyroid cancer in his sister and sister.   ROS:  Please see the history of present illness.   All other systems are personally reviewed and negative.   Vitals:   10/23/22 1053  BP: 132/88  Pulse: (!) 59  SpO2: 97%  Weight: 91.3 kg (201 lb 3.2 oz)    Wt Readings  from Last 3 Encounters:  10/23/22 91.3 kg (201 lb 3.2 oz)  07/11/22 89 kg (196 lb 3.2 oz)  06/28/22 89.6 kg (197 lb 9.6 oz)    Exam:  General:  Well appearing. No resp difficulty HEENT: normal Neck: supple. no JVD. Carotids 2+ bilat; no bruits. No lymphadenopathy or thryomegaly appreciated. Cor: PMI nondisplaced. Regular rate & rhythm. No rubs, gallops or murmurs. Lungs: clear Abdomen: soft, nontender, nondistended. No hepatosplenomegaly. No bruits or masses. Good bowel sounds. Extremities: no cyanosis, clubbing, rash, edema Neuro: alert & orientedx3, cranial nerves grossly intact. moves all 4 extremities w/o difficulty. Affect pleasant   Recent Labs: 04/12/2022: ALT 24; Hemoglobin 16.4; Platelets 145.0 06/13/2022: TSH 1.83 06/28/2022: B Natriuretic Peptide 278.9; BUN 19; Creatinine, Ser 1.25; Potassium 3.9; Sodium 138  Personally reviewed   Wt Readings from Last 3 Encounters:  10/23/22 91.3 kg (201 lb 3.2 oz)  07/11/22 89 kg (196 lb 3.2 oz)  06/28/22 89.6 kg (197 lb 9.6 oz)      ASSESSMENT AND PLAN:  1. Chronic systolic HF  - NICM St Jude  Echo 5/17 LVEF 25-30%, Mild MR, Mod LAE, RV mildly dilated, Mild RAE. ECHO 12/21/2016 LVEF 25-30%. IW AK RV mild-mod  - Echo 02/20/18 shows 30-35% AVR stable.  - Echo 2020 EF 25% Personally reviewed - March 2017 CPX with moderate HF limitation.  - CPX 6/18 much improved. Peak VO2: 21.1 (73% predicted peak VO2) VE/VCO2 slope: 35 - Echo 9/21 EF 35-40% - Echo EF 2022 35-40% RV ok  - Echo 06/28/22 EF 25-30% AVR stable - Stable NYHA II-III  Volume status looks good. Taking torsemide 20 3x/week + PRN - Continue Entresto 97/103 mg BID.  - Continue coreg 6.25 mg BID. Unable to titrate due to low HR - Continue Farxiga 10 mg daily. - Not on spiro due to K persistently > 5  2. VT/NICM - Quiescent on mexilitene/Ranexa - ICD - unable to transmit  3. HTN - Blood pressure spike recently in setting of fluid being up. Fluid now improved. If  persists - let me know if DBP consistently > 90   4. Valvular  heart disease - s/p bentall in 2001 and re-do bentall 2015.  - AVR stable on echo 11/23. Trivial AI - Reminded about need for SBE  5. Depression - Stable.    Signed, Glori Bickers, MD  10/23/2022 11:27 AM  Advanced Heart Failure [place] [address] [phone] (office) [phone] (fax)'

## 2022-10-23 ENCOUNTER — Encounter (HOSPITAL_COMMUNITY): Payer: Self-pay | Admitting: Internal Medicine

## 2022-10-23 ENCOUNTER — Ambulatory Visit (HOSPITAL_COMMUNITY)
Admission: RE | Admit: 2022-10-23 | Discharge: 2022-10-23 | Disposition: A | Discharge status: Home / Self Care | Payer: 59 | Source: Ambulatory Visit | Attending: Internal Medicine | Admitting: Internal Medicine

## 2022-10-23 VITALS — BP 132/88 | HR 59 | Wt 201.2 lb

## 2022-10-23 DIAGNOSIS — I472 Ventricular tachycardia, unspecified: Secondary | ICD-10-CM | POA: Insufficient documentation

## 2022-10-23 DIAGNOSIS — Z79890 Hormone replacement therapy (postmenopausal): Secondary | ICD-10-CM | POA: Insufficient documentation

## 2022-10-23 DIAGNOSIS — Z85850 Personal history of malignant neoplasm of thyroid: Secondary | ICD-10-CM | POA: Insufficient documentation

## 2022-10-23 DIAGNOSIS — I130 Hypertensive heart and chronic kidney disease with heart failure and stage 1 through stage 4 chronic kidney disease, or unspecified chronic kidney disease: Secondary | ICD-10-CM | POA: Insufficient documentation

## 2022-10-23 DIAGNOSIS — Z79899 Other long term (current) drug therapy: Secondary | ICD-10-CM | POA: Insufficient documentation

## 2022-10-23 DIAGNOSIS — Z953 Presence of xenogenic heart valve: Secondary | ICD-10-CM | POA: Insufficient documentation

## 2022-10-23 DIAGNOSIS — N189 Chronic kidney disease, unspecified: Secondary | ICD-10-CM | POA: Diagnosis not present

## 2022-10-23 DIAGNOSIS — E890 Postprocedural hypothyroidism: Secondary | ICD-10-CM | POA: Diagnosis not present

## 2022-10-23 DIAGNOSIS — I5022 Chronic systolic (congestive) heart failure: Secondary | ICD-10-CM

## 2022-10-23 DIAGNOSIS — I359 Nonrheumatic aortic valve disorder, unspecified: Secondary | ICD-10-CM | POA: Diagnosis not present

## 2022-10-23 DIAGNOSIS — I38 Endocarditis, valve unspecified: Secondary | ICD-10-CM | POA: Insufficient documentation

## 2022-10-23 DIAGNOSIS — I428 Other cardiomyopathies: Secondary | ICD-10-CM | POA: Insufficient documentation

## 2022-10-23 DIAGNOSIS — Z95810 Presence of automatic (implantable) cardiac defibrillator: Secondary | ICD-10-CM | POA: Diagnosis not present

## 2022-10-23 DIAGNOSIS — I10 Essential (primary) hypertension: Secondary | ICD-10-CM | POA: Diagnosis not present

## 2022-10-23 DIAGNOSIS — I132 Hypertensive heart and chronic kidney disease with heart failure and with stage 5 chronic kidney disease, or end stage renal disease: Secondary | ICD-10-CM | POA: Diagnosis not present

## 2022-10-23 DIAGNOSIS — F32A Depression, unspecified: Secondary | ICD-10-CM | POA: Diagnosis not present

## 2022-10-23 LAB — BASIC METABOLIC PANEL
Anion gap: 9 (ref 5–15)
BUN: 16 mg/dL (ref 8–23)
CO2: 27 mmol/L (ref 22–32)
Calcium: 9.5 mg/dL (ref 8.9–10.3)
Chloride: 105 mmol/L (ref 98–111)
Creatinine, Ser: 1.2 mg/dL (ref 0.61–1.24)
GFR, Estimated: >60 mL/min (ref >60)
Glucose, Bld: 105 mg/dL — ABNORMAL HIGH (ref 70–99)
Potassium: 4.9 mmol/L (ref 3.5–5.1)
Sodium: 141 mmol/L (ref 135–145)

## 2022-10-23 LAB — TSH: TSH: 1.188 u[IU]/mL (ref 0.350–4.500)

## 2022-10-23 LAB — BRAIN NATRIURETIC PEPTIDE: B Natriuretic Peptide: 236 pg/mL — ABNORMAL HIGH (ref 0.0–100.0)

## 2022-10-23 NOTE — Patient Instructions (Signed)
Good to see you today!  No medication changes were made  Labs done today, your results will be available in MyChart, we will contact you for abnormal readings.  Your physician recommends that you schedule a follow-up appointment in: 6 months(September) Call office in June to schedule an appointment  If you have any questions or concerns before your next appointment please send [place] a message through [place] or call our office at [phone].    TO LEAVE A MESSAGE FOR THE NURSE SELECT OPTION 2, PLEASE LEAVE A MESSAGE INCLUDING: YOUR NAME DATE OF BIRTH CALL BACK NUMBER REASON FOR CALL**this is important as we prioritize the call backs  YOU WILL RECEIVE A CALL BACK THE SAME DAY AS LONG AS YOU CALL BEFORE 4:00 PM  At the [facility], you and your health needs are our priority. As part of our continuing mission to provide you with exceptional heart care, we have created designated Provider Care Teams. These Care Teams include your primary Cardiologist (physician) and Advanced Practice Providers (APPs- Physician Assistants and Nurse Practitioners) who all work together to provide you with the care you need, when you need it.   You may see any of the following providers on your designated Care Team at your next follow up: Dr Glori Bickers Dr Loralie Champagne Dr. Roxana Hires, NP Lyda Jester, [place] [facility] [place], [place] Forestine Na, NP Audry Riles, PharmD   Please be sure to bring in all your medications bottles to every appointment.    Thank you for choosing [facility]

## 2022-10-30 ENCOUNTER — Ambulatory Visit: Payer: 59 | Attending: Internal Medicine

## 2022-10-30 DIAGNOSIS — Z95810 Presence of automatic (implantable) cardiac defibrillator: Secondary | ICD-10-CM | POA: Diagnosis not present

## 2022-10-30 DIAGNOSIS — I5022 Chronic systolic (congestive) heart failure: Secondary | ICD-10-CM

## 2022-10-30 NOTE — Progress Notes (Signed)
EPIC Encounter for ICM Monitoring  Patient Name: Bradley [name] is a 67 y.o. male Date: 10/30/2022 Primary Care Physican: [name], MD Heart Failure Clinic: [facility] Electrophysiologist: Caryl Comes Nephrologist:  Aldrich 08/23/2022 Weight: 198.2 lbs                                              Spoke with wife and heart failure questions reviewed.  Transmission results reviewed.  Pt reports abdominal swelling.  He ate a lot of pizza over the weekend and started feeling poorly after that.     CorVue thoracic impedance suggesting possible fluid accumulation starting 3/26.   Prescribed:  Furosemide 20 mg Take 1 tablet (20 mg total) by mouth 4 (four) times a week.   Per 06/28/2022 OV note from Dr Haroldine Laws take Furosemide 3 times week + PRN and wife confirmed on 08/23/22 he is taking 3 times week unless 4th dose needed for fluid symptoms.   Farxiga 10 mg take 1 tablet by mouth daily    Labs: 06/28/2022 Creatinine 1.25, BUN 19, Potassium 3.9, Sodium 138, GFR >60 04/12/2022 Creatinine 1.19, BUN 17, Potassium 5.0, Sodium 141, GFR 64.25 03/23/2022 Creatinine 1.34, BUN 17, Potassium 4.7, Sodium 141, GFR 59  A complete set of results can be found in Results Review.   Recommendations:  Pt will take extra Furosemide this week and limit salt intake.   Follow-up plan: ICM clinic phone appointment on 11/06/2022 to recheck fluid levels.   91 day device clinic remote transmission 12/07/2022.     EP/Cardiology Office Visits: 10/23/2022 with Dr Haroldine Laws.  Recall 07/06/2023 with Oda Kilts, PA     Copy of ICM check sent to Dr. Caryl Comes.    3 month ICM trend: 10/30/2022.    12-14 Month ICM trend:     [name], RN 10/30/2022 3:29 PM

## 2022-11-02 ENCOUNTER — Telehealth (HOSPITAL_COMMUNITY): Payer: Self-pay | Admitting: Cardiology

## 2022-11-02 NOTE — Telephone Encounter (Signed)
Patients wife called with concerns of elevated b/p readings Reports they have been dining out quite a bit -currently taking double diuretics for excess fluid -wife would like to know how to proceed for blood pressure   Readings  147/99 148/93 136/91 140/90 129/81 159/101  139/96  Reports non compliance with sodium  Reports some compliance with fluid restrictions No weights to report   Advised although readings are elevated- they are not severe as just slightly greater than 140/90 Continue to take meds daily, check b/p about 1 hr after morning dose, follow low sodium diet, follow fluid restrictions, be sure to follow diuretic instructions per device clinic  At last OV noted that DBP raised with fluid increase so unsure if provider would like to make changes  Will forward to provider for further recommendations

## 2022-11-06 ENCOUNTER — Ambulatory Visit: Payer: 59 | Attending: Internal Medicine

## 2022-11-06 DIAGNOSIS — Z95810 Presence of automatic (implantable) cardiac defibrillator: Secondary | ICD-10-CM

## 2022-11-06 DIAGNOSIS — I5022 Chronic systolic (congestive) heart failure: Secondary | ICD-10-CM

## 2022-11-06 MED ORDER — DOXAZOSIN MESYLATE 1 MG PO TABS: 1.0000 mg | ORAL_TABLET | Freq: Every day | ORAL | 11 refills | Status: DC

## 2022-11-06 NOTE — Progress Notes (Signed)
EPIC Encounter for ICM Monitoring  Patient Name: Bradley [name] is a 67 y.o. male Date: 11/06/2022 Primary Care Physican: Kristian Covey, MD Heart Failure Clinic: [facility] Electrophysiologist: Graciela Husbands Nephrologist:  Deterding 08/23/2022 Weight: 198.2 lbs                                              Spoke with wife and heart failure questions reviewed.  Transmission results reviewed.  Pt reports he is feeling okay.  She reports he does drink a lot of Gatorade type of drinks and advised to avoid drinks that contain salt unless he has a lot of diarrhea or vomiting which he does not at this time.     CorVue thoracic impedance suggesting possible fluid accumulation changed to possible dryness starting 4/2 in response to taking extra Furosemide.     Prescribed:  Furosemide 20 mg Take 1 tablet (20 mg total) by mouth 4 (four) times a week.   Per 06/28/2022 OV note from Dr Gala Romney take Furosemide 3 times week + PRN and wife confirmed on 08/23/22 he is taking 3 times week unless 4th dose needed for fluid symptoms.   Farxiga 10 mg take 1 tablet by mouth daily    Labs: 06/28/2022 Creatinine 1.25, BUN 19, Potassium 3.9, Sodium 138, GFR >60 04/12/2022 Creatinine 1.19, BUN 17, Potassium 5.0, Sodium 141, GFR 64.25 03/23/2022 Creatinine 1.34, BUN 17, Potassium 4.7, Sodium 141, GFR 59  A complete set of results can be found in Results Review.   Recommendations:  Advised to drink 64 oz fluid daily to stay hydrated.  She verbalized understanding.     Follow-up plan: ICM clinic phone appointment on 11/13/2022 to recheck fluid levels.   91 day device clinic remote transmission 12/07/2022.     EP/Cardiology Office Visits:  Recall 04/21/2023 with Dr Gala Romney.  Recall 07/06/2023 with Otilio Saber, PA     Copy of ICM check sent to Dr. Graciela Husbands.   3 month ICM trend: 11/06/2022.    12-14 Month ICM trend:     Karie Soda, RN 11/06/2022 2:34 PM

## 2022-11-06 NOTE — Telephone Encounter (Signed)
Phone: [phone] (H)   LMOM

## 2022-11-06 NOTE — Telephone Encounter (Signed)
Pt aware via wife 

## 2022-11-13 ENCOUNTER — Ambulatory Visit: Payer: 59 | Attending: Internal Medicine

## 2022-11-13 DIAGNOSIS — I5022 Chronic systolic (congestive) heart failure: Secondary | ICD-10-CM

## 2022-11-13 DIAGNOSIS — Z95810 Presence of automatic (implantable) cardiac defibrillator: Secondary | ICD-10-CM

## 2022-11-13 NOTE — Progress Notes (Signed)
EPIC Encounter for ICM Monitoring  Patient Name: Bradley [name] is a 67 y.o. male Date: 11/13/2022 Primary Care Physican: Kristian Covey, MD Primary Cardiologist: [facility] Electrophysiologist: Graciela Husbands Nephrologist:  Deterding 08/23/2022 Weight: 198.2 lbs 11/13/2022 Weight: 198 lbs                                              Spoke with patient and heart failure questions reviewed.  Transmission results reviewed.  Pt reports he is feeling well other than a little tiredness    CorVue thoracic impedance suggesting fluid levels returned close to normal.     Prescribed:  Furosemide 20 mg Take 1 tablet (20 mg total) by mouth 4 (four) times a week.   Per 06/28/2022 OV note from Dr Gala Romney take Furosemide 3 times week + PRN and wife confirmed on 08/23/22 he is taking 3 times week unless 4th dose needed for fluid symptoms.   Farxiga 10 mg take 1 tablet by mouth daily    Labs: 06/28/2022 Creatinine 1.25, BUN 19, Potassium 3.9, Sodium 138, GFR >60 04/12/2022 Creatinine 1.19, BUN 17, Potassium 5.0, Sodium 141, GFR 64.25 03/23/2022 Creatinine 1.34, BUN 17, Potassium 4.7, Sodium 141, GFR 59  A complete set of results can be found in Results Review.   Recommendations:  Advised to drink 64 oz fluid daily to stay hydrated.  She verbalized understanding.     Follow-up plan: ICM clinic phone appointment on 12/04/2022.   91 day device clinic remote transmission 12/07/2022.     EP/Cardiology Office Visits:  Recall 04/21/2023 with Dr Gala Romney.  Recall 07/06/2023 with Otilio Saber, PA     Copy of ICM check sent to Dr. Graciela Husbands.   3 month ICM trend: 11/13/2022.    12-14 Month ICM trend:     Karie Soda, RN 11/13/2022 10:29 AM

## 2022-11-17 ENCOUNTER — Encounter: Payer: Self-pay | Admitting: Family Medicine

## 2022-11-17 ENCOUNTER — Ambulatory Visit (INDEPENDENT_AMBULATORY_CARE_PROVIDER_SITE_OTHER): Payer: 59 | Admitting: Family Medicine

## 2022-11-17 VITALS — BP 140/80 | HR 70 | Temp 98.0°F | Ht 72.0 in | Wt 197.8 lb

## 2022-11-17 DIAGNOSIS — I10 Essential (primary) hypertension: Secondary | ICD-10-CM | POA: Diagnosis not present

## 2022-11-17 DIAGNOSIS — F411 Generalized anxiety disorder: Secondary | ICD-10-CM | POA: Diagnosis not present

## 2022-11-17 DIAGNOSIS — I5042 Chronic combined systolic (congestive) and diastolic (congestive) heart failure: Secondary | ICD-10-CM

## 2022-11-17 DIAGNOSIS — R140 Abdominal distension (gaseous): Secondary | ICD-10-CM | POA: Diagnosis not present

## 2022-11-17 DIAGNOSIS — R630 Anorexia: Secondary | ICD-10-CM

## 2022-11-17 DIAGNOSIS — R110 Nausea: Secondary | ICD-10-CM

## 2022-11-17 DIAGNOSIS — R5383 Other fatigue: Secondary | ICD-10-CM | POA: Diagnosis not present

## 2022-11-17 LAB — CBC WITH DIFFERENTIAL/PLATELET
Basophils Absolute: 0.1 10*3/uL (ref 0.0–0.1)
Basophils Relative: 0.8 % (ref 0.0–3.0)
Eosinophils Absolute: 0.1 10*3/uL (ref 0.0–0.7)
Eosinophils Relative: 1.9 % (ref 0.0–5.0)
HCT: 53.3 % — ABNORMAL HIGH (ref 39.0–52.0)
Hemoglobin: 17.8 g/dL — ABNORMAL HIGH (ref 13.0–17.0)
Lymphocytes Relative: 15.8 % (ref 12.0–46.0)
Lymphs Abs: 1.2 10*3/uL (ref 0.7–4.0)
MCHC: 33.4 g/dL (ref 30.0–36.0)
MCV: 95.3 fl (ref 78.0–100.0)
Monocytes Absolute: 0.9 10*3/uL (ref 0.1–1.0)
Monocytes Relative: 11.7 % (ref 3.0–12.0)
Neutro Abs: 5.3 10*3/uL (ref 1.4–7.7)
Neutrophils Relative %: 69.8 % (ref 43.0–77.0)
Platelets: 173 10*3/uL (ref 150.0–400.0)
RBC: 5.59 Mil/uL (ref 4.22–5.81)
RDW: 13.9 % (ref 11.5–15.5)
WBC: 7.7 10*3/uL (ref 4.0–10.5)

## 2022-11-17 LAB — COMPREHENSIVE METABOLIC PANEL
ALT: 33 U/L (ref 0–53)
AST: 32 U/L (ref 0–37)
Albumin: 4.3 g/dL (ref 3.5–5.2)
Alkaline Phosphatase: 59 U/L (ref 39–117)
BUN: 17 mg/dL (ref 6–23)
CO2: 22 mEq/L (ref 19–32)
Calcium: 9.9 mg/dL (ref 8.4–10.5)
Chloride: 106 mEq/L (ref 96–112)
Creatinine, Ser: 1.33 mg/dL (ref 0.40–1.50)
GFR: 55.98 mL/min — ABNORMAL LOW (ref >60.00)
Glucose, Bld: 91 mg/dL (ref 70–99)
Potassium: 4.6 mEq/L (ref 3.5–5.1)
Sodium: 141 mEq/L (ref 135–145)
Total Bilirubin: 0.4 mg/dL (ref 0.2–1.2)
Total Protein: 7.3 g/dL (ref 6.0–8.3)

## 2022-11-17 LAB — BRAIN NATRIURETIC PEPTIDE: Pro B Natriuretic peptide (BNP): 260 pg/mL — ABNORMAL HIGH (ref 0.0–100.0)

## 2022-11-17 LAB — TSH: TSH: 4.11 u[IU]/mL (ref 0.35–5.50)

## 2022-11-17 MED ORDER — OMEPRAZOLE 20 MG PO CPDR: 20.0000 mg | DELAYED_RELEASE_CAPSULE | Freq: Every day | ORAL | 1 refills | Status: DC

## 2022-11-17 NOTE — Progress Notes (Signed)
Established Patient Office Visit   Subjective  Patient ID: Bradley [name], male    DOB: May 27, 1956  Age: 67 y.o. MRN: [id number]  Chief Complaint  Patient presents with   Nausea    Patient complains of nausea, x2 week,    Bloated    Patient complains of bloating, x2 weeks   Anxiety    Patient complains of anxiety   Fatigue    Patient complains of fatigue, x2 weeks     Pt is a 67 yo male with pmh sig for CKD II, combined CHF, CVD, MI, MDD, GAD, h/o endocarditis s/p AVR, hypothyroidism, HTN, NICM s/p AICD followed by Dr. Caryl Never and seen for acute concern.  Patient endorses nausea, fatigue, bloating, elevated BP, increased anxiety x 2 weeks.  Pt's wife notes increased dry heaves and belching.  Pt may eat a few bites of food.  3 stress related to family members.  Patient denies constipation.  Last BM today.  Typically has regular BMs, denies straining or diarrhea.  Last colonoscopy 07/22/2019 several polyps removed, Dr. Myrtie Neither.  Denies dysphagia, sour acid taste in mouth, burning sensation in chest or stomach, abdominal pain, dysuria, urinary frequency.  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    CKD (chronic kidney disease), stage II    H/O aortic valve repair- 1968 12/07/2013   Non-ischemic cardiomyopathy- EF 20-25% - TTE 11/2013 12/07/2013   Ventricular tachycardia    Elevated troponin- chronic- normal coronaries 11/25/2013   Chronic combined systolic and diastolic CHF (congestive heart failure) 10/22/2013   Major depressive disorder, recurrent, severe with psychotic features- hospitalized in Jan 2013 08/20/2011   Generalized anxiety disorder 08/20/2011   AVR- Zachary George 2011, re-do March 2015 secondary to valve endocarditis 12/29/2010   Hypertension  12/29/2010    Hypothyroidism 03/05/2009   Past Surgical History:  Procedure Laterality Date   AORTIC VALVE REPAIR  1968   AORTIC VALVE REPLACEMENT  2011   AV FISTULA PLACEMENT Left 10/08/2013   Procedure: ARTERIOVENOUS (AV) FISTULA CREATION- LEFT ARM; Radial Cephalic ;  Surgeon: Pryor Ochoa, MD;  Location: [facility] OR;  Service: Vascular;  Laterality: Left;   BENTALL PROCEDURE N/A 10/28/2013   Procedure: REDO BENTALL PROCEDURE, debridment of aoritc root abscess, replacement of aortic root, ascending aorta and aortic valve with homograft. Insertion of left femoral arterial line;  Surgeon: Delight Ovens, MD;  Location: [facility] OR;  Service: Open Heart Surgery;  Laterality: N/A;   BIOPSY  07/22/2019   Procedure: BIOPSY;  Surgeon: Sherrilyn Rist, MD;  Location: [facility];  Service: Gastroenterology;;   CARDIAC CATHETERIZATION  03/02/2010   NORMAL CORONARY ARTERY   CARDIAC CATHETERIZATION N/A 01/01/2015   Procedure: Coronary/Graft Angiography;  Surgeon: Lennette Bihari, MD;  Location: [facility] INVASIVE CV LAB;  Service: Cardiovascular;  Laterality: N/A;   CARDIAC VALVE REPLACEMENT     COLONOSCOPY WITH PROPOFOL N/A 07/22/2019   Procedure: COLONOSCOPY WITH PROPOFOL;  Surgeon: Sherrilyn Rist, MD;  Location: [facility] ENDOSCOPY;  Service: Gastroenterology;  Laterality: N/A;   EP IMPLANTABLE DEVICE N/A 01/01/2015   Procedure: ICD Implant;  Surgeon: Duke Salvia, MD;  Location: [facility] INVASIVE CV LAB;  Service: Cardiovascular;  Laterality: N/A;   HEMOSTASIS CLIP PLACEMENT  07/22/2019   Procedure: HEMOSTASIS CLIP PLACEMENT;  Surgeon:  Sherrilyn Rist, MD;  Location: [facility];  Service: Gastroenterology;;   INTRAOPERATIVE TRANSESOPHAGEAL ECHOCARDIOGRAM N/A 10/28/2013   Procedure: INTRAOPERATIVE TRANSESOPHAGEAL ECHOCARDIOGRAM;  Surgeon: Delight Ovens, MD;  Location: [facility] OR;  Service: Open Heart Surgery;  Laterality: N/A;   PERIPHERAL VASCULAR CATHETERIZATION N/A 01/01/2015   Procedure: Aortic Arch Angiography;  Surgeon: Lennette Bihari, MD;  Location: [facility] INVASIVE CV LAB;  Service: Cardiovascular;  Laterality: N/A;   POLYPECTOMY  07/22/2019   Procedure: POLYPECTOMY;  Surgeon: Sherrilyn Rist, MD;  Location: [facility] ENDOSCOPY;  Service: Gastroenterology;;   SHOULDER ARTHROSCOPY W/ ROTATOR CUFF REPAIR Right 2012   STERNOTOMY     REDO   TEE WITHOUT CARDIOVERSION N/A 10/24/2013   Procedure: TRANSESOPHAGEAL ECHOCARDIOGRAM (TEE);  Surgeon: Lars Masson, MD;  Location: [facility] ENDOSCOPY;  Service: Cardiovascular;  Laterality: N/A;   THYROIDECTOMY  ~ 2005   TRANSTHORACIC ECHOCARDIOGRAM  03/2010   SHOWED MILD REDUCTION OF LV FUNCTION   Social History   Tobacco Use   Smoking status: Never   Smokeless tobacco: Never  Vaping Use   Vaping Use: Never used  Substance Use Topics   Alcohol use: No    Alcohol/week: 0.0 standard drinks of alcohol    Comment: Occasional   Drug use: No   Family History  Problem Relation Age of Onset   Hypertension Mother    Heart disease Father    Stroke Father    Hypertension Father    Thyroid cancer Sister    Thyroid cancer Sister    Kidney Stones Sister    Heart attack Neg Hx    Colon cancer Neg Hx    Colon polyps Neg Hx    Esophageal cancer Neg Hx    Stomach cancer Neg Hx    Rectal cancer Neg Hx    Allergies  Allergen Reactions   Oxycodone Other (See Comments)    Gives patient nightmares   Rifampin Nausea Only      ROS Negative unless stated above   .phq Objective:     BP (!) 140/80 (BP Location: Left Arm, Patient Position: Sitting, Cuff Size: Normal)   Pulse 70   Temp 98 F (36.7 C) (Oral)   Ht 6' (1.829 m)   Wt 197 lb 12.8 oz (89.7 kg)   SpO2 98%   BMI 26.83 kg/m  BP Readings from Last 3 Encounters:  11/17/22 (!) 140/80  10/23/22 132/88  07/11/22 134/86   Wt Readings from Last 3 Encounters:  11/17/22 197 lb 12.8 oz (89.7 kg)  10/23/22 201 lb 3.2 oz (91.3 kg)  07/11/22 196 lb 3.2 oz (89 kg)      Physical Exam Constitutional:      General: He is not in  acute distress.    Appearance: Normal appearance.  HENT:     Head: Normocephalic and atraumatic.     Nose: Nose normal.     Mouth/Throat:     Mouth: Mucous membranes are moist.  Cardiovascular:     Rate and Rhythm: Normal rate and regular rhythm.     Heart sounds: Normal heart sounds. No murmur heard.    No gallop.     Comments: Mechanical click.  No bilateral edema in LEs. Pulmonary:     Effort: Pulmonary effort is normal. No respiratory distress.     Breath sounds: Normal breath sounds. No wheezing, rhonchi or rales.  Abdominal:     General: Bowel sounds are normal. There is no distension.     Palpations: Abdomen  is soft. There is no mass.     Tenderness: There is no abdominal tenderness. There is no guarding or rebound.  Skin:    General: Skin is warm and dry.  Neurological:     Mental Status: He is alert and oriented to person, place, and time.     No results found for any visits on 11/17/22.    Assessment & Plan:  Nausea -New problem -     Comprehensive metabolic panel -     Omeprazole; Take 1 capsule (20 mg total) by mouth daily.  Dispense: 30 capsule; Refill: 1  Bloating -New problem -     Comprehensive metabolic panel -     Omeprazole; Take 1 capsule (20 mg total) by mouth daily.  Dispense: 30 capsule; Refill: 1  Generalized anxiety disorder -Worsening per pt -     TSH  Fatigue, unspecified type -New problem -     CBC with Differential/Platelet -     TSH -     Brain natriuretic peptide  Chronic combined systolic and diastolic CHF (congestive heart failure) -Euvolemic on exam -     Brain natriuretic peptide  Essential hypertension -Elevated x 2 weeks -Lifestyle modifications -Continue follow-up with PCP and cards -     Comprehensive metabolic panel -     TSH  Decreased appetite -New problem -No weight loss -Continue to offer snacks, protein drinks, etc.  Discussed possible causes of symptoms including GERD, increased anxiety, H. pylori,  medication/dietary changes, CHF, constipation-though less likely as patient denies.  Weight stable.  Euvolemic on exam.  Start trial of PPI.  Obtain labs.  For continued symptoms GI referral.  No follow-ups on file.   Deeann Saint, MD

## 2022-11-17 NOTE — Patient Instructions (Signed)
A prescription for omeprazole was sent to your pharmacy.  You can take this once a day to see if you notice a difference in the bloating and nausea.  For continued or worsening symptoms will need to see the gastroenterologist.

## 2022-12-01 ENCOUNTER — Other Ambulatory Visit: Payer: Self-pay | Admitting: Family Medicine

## 2022-12-04 ENCOUNTER — Ambulatory Visit: Payer: 59 | Attending: Internal Medicine

## 2022-12-04 DIAGNOSIS — Z95810 Presence of automatic (implantable) cardiac defibrillator: Secondary | ICD-10-CM | POA: Diagnosis not present

## 2022-12-04 DIAGNOSIS — I5022 Chronic systolic (congestive) heart failure: Secondary | ICD-10-CM

## 2022-12-06 NOTE — Progress Notes (Signed)
EPIC Encounter for ICM Monitoring  Patient Name: Bradley [name] is a 67 y.o. male Date: 12/06/2022 Primary Care Physican: Kristian Covey, MD Primary Cardiologist: Bensimhon Electrophysiologist: Graciela Husbands Nephrologist:  Deterding 08/23/2022 Weight: 198.2 lbs 11/13/2022 Weight: 198 lbs                                              Spoke with wife per DPR and heart failure questions reviewed.  Transmission results reviewed.  Pt reports he generally is not feeling well and feeling tired.  She stated heart rate has increased.     CorVue thoracic impedance suggesting possible dryness starting 5/1.    Possible fluid accumulation from 4/10-4/19 and/21-4/25.   Prescribed:  Furosemide 20 mg Take 1 tablet (20 mg total) by mouth 4 (four) times a week.   Per 06/28/2022 OV note from Dr Gala Romney take Furosemide 3 times week + PRN and wife confirmed on 08/23/22 he is taking 3 times week unless 4th dose needed for fluid symptoms.   Farxiga 10 mg take 1 tablet by mouth daily    Labs: 06/28/2022 Creatinine 1.25, BUN 19, Potassium 3.9, Sodium 138, GFR >60 04/12/2022 Creatinine 1.19, BUN 17, Potassium 5.0, Sodium 141, GFR 64.25 03/23/2022 Creatinine 1.34, BUN 17, Potassium 4.7, Sodium 141, GFR 59  A complete set of results can be found in Results Review.   Recommendations:  Advised to drink 64 oz fluid daily to stay hydrated and to hold tomorrows dosage of Furosemide but resume as prescribed after holding one dose.  She verbalized understanding.     Follow-up plan: ICM clinic phone appointment on 12/11/2022 recheck fluid levels.   91 day device clinic remote transmission 12/07/2022.     EP/Cardiology Office Visits:  Recall 04/21/2023 with Dr Gala Romney.  Recall 07/06/2023 with Otilio Saber, PA     Copy of ICM check sent to Dr. Graciela Husbands.   3 month ICM trend: 12/04/2022.    12-14 Month ICM trend:     Karie Soda, RN 12/06/2022 2:21 PM

## 2022-12-09 ENCOUNTER — Other Ambulatory Visit: Payer: Self-pay | Admitting: Family Medicine

## 2022-12-09 DIAGNOSIS — R140 Abdominal distension (gaseous): Secondary | ICD-10-CM

## 2022-12-09 DIAGNOSIS — R110 Nausea: Secondary | ICD-10-CM

## 2022-12-10 ENCOUNTER — Other Ambulatory Visit (HOSPITAL_COMMUNITY): Payer: Self-pay | Admitting: Internal Medicine

## 2022-12-11 ENCOUNTER — Ambulatory Visit: Payer: 59 | Attending: Internal Medicine

## 2022-12-11 DIAGNOSIS — Z95810 Presence of automatic (implantable) cardiac defibrillator: Secondary | ICD-10-CM

## 2022-12-11 DIAGNOSIS — I5022 Chronic systolic (congestive) heart failure: Secondary | ICD-10-CM

## 2022-12-13 NOTE — Progress Notes (Signed)
EPIC Encounter for ICM Monitoring  Patient Name: Bradley [name] is a 67 y.o. male Date: 12/13/2022 Primary Care Physican: Kristian Covey, MD Primary Cardiologist: Bensimhon Electrophysiologist: Graciela Husbands Nephrologist:  Deterding 08/23/2022 Weight: 198.2 lbs 11/13/2022 Weight: 198 lbs                                              Attempted call to wife/patient and unable to reach.  Left detailed message per DPR regarding transmission. Transmission reviewed. laints.     CorVue thoracic impedance suggesting fluid levels returned to normal.   Prescribed:  Furosemide 20 mg Take 1 tablet (20 mg total) by mouth 4 (four) times a week.   Per 06/28/2022 OV note from Dr Gala Romney take Furosemide 3 times week + PRN and wife confirmed on 08/23/22 he is taking 3 times week unless 4th dose needed for fluid symptoms.   Farxiga 10 mg take 1 tablet by mouth daily    Labs: 06/28/2022 Creatinine 1.25, BUN 19, Potassium 3.9, Sodium 138, GFR >60 04/12/2022 Creatinine 1.19, BUN 17, Potassium 5.0, Sodium 141, GFR 64.25 03/23/2022 Creatinine 1.34, BUN 17, Potassium 4.7, Sodium 141, GFR 59  A complete set of results can be found in Results Review.   Recommendations:  Left voice mail with ICM number and encouraged to call if experiencing any fluid symptoms.   Follow-up plan: ICM clinic phone appointment on 01/08/2023.   91 day device clinic remote transmission 03/08/2023.     EP/Cardiology Office Visits:  Recall 04/21/2023 with Dr Gala Romney.  Recall 07/06/2023 with Otilio Saber, PA     Copy of ICM check sent to Dr. Graciela Husbands.   3 month ICM trend: 12/11/2022.    12-14 Month ICM trend:     Karie Soda, RN 12/13/2022 7:43 AM

## 2022-12-15 ENCOUNTER — Telehealth: Payer: Self-pay

## 2022-12-15 NOTE — Telephone Encounter (Signed)
Remote ICM transmission received.  Attempted call to patient regarding ICM remote transmission and left detailed message per DPR.  Advised to return call for any fluid symptoms or questions. Next ICM remote transmission scheduled 01/08/2023.    

## 2022-12-18 ENCOUNTER — Ambulatory Visit (INDEPENDENT_AMBULATORY_CARE_PROVIDER_SITE_OTHER): Payer: 59

## 2022-12-18 DIAGNOSIS — I5022 Chronic systolic (congestive) heart failure: Secondary | ICD-10-CM | POA: Diagnosis not present

## 2022-12-18 DIAGNOSIS — I428 Other cardiomyopathies: Secondary | ICD-10-CM

## 2022-12-20 LAB — CUP PACEART REMOTE DEVICE CHECK
Battery Remaining Longevity: 14 mo
Battery Remaining Percentage: 14 %
Battery Voltage: 2.72 V
Brady Statistic RV Percent Paced: 1 %
Date Time Interrogation Session: 20240520020030
HighPow Impedance: 75 Ohm
HighPow Impedance: 75 Ohm
Implantable Lead Connection Status: 753985
Implantable Lead Implant Date: 20160603
Implantable Lead Location: 753860
Implantable Lead Model: 7122
Implantable Pulse Generator Implant Date: 20160603
Lead Channel Impedance Value: 440 Ohm
Lead Channel Pacing Threshold Amplitude: 1.5 V
Lead Channel Pacing Threshold Pulse Width: 0.5 ms
Lead Channel Sensing Intrinsic Amplitude: 7.4 mV
Lead Channel Setting Pacing Amplitude: 2.5 V
Lead Channel Setting Pacing Pulse Width: 0.5 ms
Lead Channel Setting Sensing Sensitivity: 0.5 mV
Pulse Gen Serial Number: 7135169

## 2022-12-28 ENCOUNTER — Telehealth: Payer: Self-pay

## 2022-12-28 NOTE — Progress Notes (Signed)
Care Management & Coordination Services Pharmacy Team  Reason for Encounter: Hypertension  Contacted patient to discuss hypertension disease state. Spoke with patient on 01/01/2023    Current antihypertensive regimen:  Carvedilol 6.25 mg twice daily Entresto 97/103 mg twice daily Patient verbally confirms he is taking the above medications as directed. Yes  How often are you checking your Blood Pressure?  Patient states they try to check his readings daily, he checks his blood pressure after taking medications   Current home BP readings:  DATE:             BP                12/22/22 122/72  12/24/22 125/79 12/28/22 114/70 12/29/22 148/86  Any readings above 180/100? Patient denies  What recent interventions/DTPs have been made by any provider to improve Blood Pressure control since last CPP Visit: No recent interventions  Any recent hospitalizations or ED visits since last visit with CPP? No recent hospital visits.   What diet changes have been made to improve Blood Pressure Control?  Patient follows a lower carb and sodium Breakfast - generally doesn't eat breakfast Lunch - a variety from hamburger to leftovers Dinner - a meal containing a meat and vegetables.  Caffeine intake - none Salt intake - no salt added to food  What exercise is being done to improve your Blood Pressure Control?  Patient states he walks daily and does his own yard work   Adherence Review: Is the patient currently on ACE/ARB medication? No Does the patient have >5 day gap between last estimated fill dates? No  Care Gaps: AWV - completed 10/19/2022 Last BP - 140/80 on 11/17/2022 HIV Screen - never done Shingrix - never done Covid - overdue Colonoscopy - overdue   Star Rating Drugs: Atorvastatin 40mg  - last filled on 12/15/2022 90DS at [facility]  Farxiga 10mg  - last filled 12/21/2022 30 DS at [facility]  Chart Updates: Recent office visits:  11/17/2022 Abbe Amsterdam MD - Patient was seen for nausea and  additional concerns. Started Omeprazole.   10/19/2022 Kriste Basque DO - Patient was seen for Encounter for Medicare annual wellness exam. No medication changes.   Recent consult visits:  None  Hospital visits:  None  Medications: Outpatient Encounter Medications as of 12/28/2022  Medication Sig   acetaminophen (TYLENOL) 325 MG tablet Take 325 mg by mouth 2 (two) times daily as needed for moderate pain or headache.   ARIPiprazole (ABILIFY) 5 MG tablet Take 5 mg by mouth daily.   aspirin EC 81 MG tablet Take 81 mg by mouth at bedtime.    atorvastatin (LIPITOR) 40 MG tablet TAKE 1 TABLET BY MOUTH EVERY DAY   carvedilol (COREG) 6.25 MG tablet TAKE 1 TABLET BY MOUTH TWICE A DAY WITH FOOD   clonazePAM (KLONOPIN) 1 MG tablet TAKE 1 TABLET BY MOUTH TWICE A DAY *NOT FOR REGULAR USE* (Patient taking differently: Patient takes 1 tablet 3 times per week as needed.)   docusate sodium (COLACE) 100 MG capsule Take 100 mg by mouth daily.   doxazosin (CARDURA) 1 MG tablet Take 1 tablet (1 mg total) by mouth daily. For SBP greater than 140   Ensure (ENSURE) Take 237 mLs by mouth daily as needed.   FARXIGA 10 MG TABS tablet TAKE 1 TABLET BY MOUTH EVERY DAY   furosemide (LASIX) 20 MG tablet TAKE 1 TABLET (20 MG TOTAL) BY MOUTH 4 (FOUR) TIMES A WEEK.   levothyroxine (SYNTHROID) 150 MCG tablet Take  1 tablet (150 mcg total) by mouth daily.   mexiletine (MEXITIL) 150 MG capsule TAKE 2 CAPSULES BY MOUTH EVERY MORNING AND 2 CAPSULES BY MOUTH EVERY EVENING.   omeprazole (PRILOSEC) 20 MG capsule TAKE 1 CAPSULE BY MOUTH EVERY DAY   ranolazine (RANEXA) 500 MG 12 hr tablet TAKE 1 TABLET BY MOUTH TWICE A DAY   sacubitril-valsartan (ENTRESTO) 97-103 MG Take 1 tablet by mouth 2 (two) times daily.   Salicylic Acid (WART REMOVER EX) Apply 1 application topically daily as needed (foot corns).   sertraline (ZOLOFT) 50 MG tablet TAKE 1 TABLET BY MOUTH EVERY DAY   No facility-administered encounter medications on file as of  12/28/2022.  Fill History:  Dispensed Days Supply Quantity Provider Pharmacy  ARIPIPRAZOLE 10 MG TABLET 08/31/2022 90 90 each      Dispensed Days Supply Quantity Provider Pharmacy  ATORVASTATIN 40 MG TABLET 12/15/2022 90 90 each      Dispensed Days Supply Quantity Provider Pharmacy  CARVEDILOL 6.25 MG TABLET 12/11/2022 90 180 each      Dispensed Days Supply Quantity Provider Pharmacy  CLONAZEPAM 1 MG TABLET 09/26/2022 15 30 each      Dispensed Days Supply Quantity Provider Pharmacy  FARXIGA 10 MG TABLET 12/21/2022 30 30 each      Dispensed Days Supply Quantity Provider Pharmacy  DOXAZOSIN MESYLATE 1 MG TAB 11/06/2022 90 90 each      Dispensed Days Supply Quantity Provider Pharmacy  FUROSEMIDE 20 MG TABLET 11/14/2022 84 48 each      Dispensed Days Supply Quantity Provider Pharmacy  LEVOTHYROXINE 150 MCG TABLET 10/12/2022 90 90 each      Dispensed Days Supply Quantity Provider Pharmacy  MEXILETINE 150 MG CAPSULE 12/18/2022 90 360 each      Dispensed Days Supply Quantity Provider Pharmacy  OMEPRAZOLE DR 20 MG CAPSULE 12/11/2022 90 90 each      Dispensed Days Supply Quantity Provider Pharmacy  RANOLAZINE ER 500 MG TABLET 10/28/2022 90 180 each      Dispensed Days Supply Quantity Provider Pharmacy  ENTRESTO 97 MG-103 MG TABLET 11/07/2022 90 180 each      Dispensed Days Supply Quantity Provider Pharmacy  SERTRALINE HCL 50 MG TABLET 11/10/2022 90 90 each     Recent Office Vitals: BP Readings from Last 3 Encounters:  11/17/22 (!) 140/80  10/23/22 132/88  07/11/22 134/86   Pulse Readings from Last 3 Encounters:  11/17/22 70  10/23/22 (!) 59  07/11/22 63    Wt Readings from Last 3 Encounters:  11/17/22 197 lb 12.8 oz (89.7 kg)  10/23/22 201 lb 3.2 oz (91.3 kg)  07/11/22 196 lb 3.2 oz (89 kg)     Kidney Function Lab Results  Component Value Date/Time   CREATININE 1.33 11/17/2022 11:31 AM   CREATININE 1.20 10/23/2022 11:35 AM   CREATININE 1.28 10/14/2015 12:28 PM    CREATININE 1.21 07/12/2015 12:07 PM   GFR 55.98 (L) 11/17/2022 11:31 AM   GFRNONAA >60 10/23/2022 11:35 AM   GFRAA 74 04/13/2020 04:09 PM       Latest Ref Rng & Units 11/17/2022   11:31 AM 10/23/2022   11:35 AM 06/28/2022   12:16 PM  BMP  Glucose 70 - 99 mg/dL 91  161  096   BUN 6 - 23 mg/dL 17  16  19    Creatinine 0.40 - 1.50 mg/dL 0.45  4.09  8.11   Sodium 135 - 145 mEq/L 141  141  138   Potassium 3.5 - 5.1 mEq/L  4.6  4.9  3.9   Chloride 96 - 112 mEq/L 106  105  108   CO2 19 - 32 mEq/L 22  27  23    Calcium 8.4 - 10.5 mg/dL 9.9  9.5  9.0    Inetta Fermo [facility]  Clinical Pharmacist Assistant [phone]

## 2023-01-08 ENCOUNTER — Ambulatory Visit: Payer: 59 | Attending: Internal Medicine

## 2023-01-08 DIAGNOSIS — I5022 Chronic systolic (congestive) heart failure: Secondary | ICD-10-CM | POA: Diagnosis not present

## 2023-01-08 DIAGNOSIS — Z95810 Presence of automatic (implantable) cardiac defibrillator: Secondary | ICD-10-CM | POA: Diagnosis not present

## 2023-01-10 ENCOUNTER — Telehealth: Payer: Self-pay

## 2023-01-10 NOTE — Telephone Encounter (Signed)
Remote ICM transmission received.  Attempted call to patient regarding ICM remote transmission and no answer.  

## 2023-01-10 NOTE — Progress Notes (Signed)
EPIC Encounter for ICM Monitoring  Patient Name: Bradley [name] is a 67 y.o. male Date: 01/10/2023 Primary Care Physican: Kristian Covey, MD Primary Cardiologist: Bensimhon Electrophysiologist: Graciela Husbands Nephrologist:  Deterding 08/23/2022 Weight: 198.2 lbs 11/13/2022 Weight: 198 lbs                                              Attempted call to patient and unable to reach.  Transmission reviewed.    CorVue thoracic impedance suggesting normal fluid levels with the exception of possible fluid accumulation from 5/8-5/11 and 5/12-5/18.   Prescribed:  Furosemide 20 mg Take 1 tablet (20 mg total) by mouth 4 (four) times a week.   Per 06/28/2022 OV note from Dr Gala Romney take Furosemide 3 times week + PRN and wife confirmed on 08/23/22 he is taking 3 times week unless 4th dose needed for fluid symptoms.   Farxiga 10 mg take 1 tablet by mouth daily    Labs: 11/17/2022 Creatinine 1.33, BUN 17, Potassium 4.6, Sodium 141 10/23/2022 Creatinine 1.20, BUN 16, Potassium 4.9, Sodium 141, GFR >60 A complete set of results can be found in Results Review.   Recommendations:  Unable to reach.     Follow-up plan: ICM clinic phone appointment on 02/12/2023.   91 day device clinic remote transmission 03/19/2023.     EP/Cardiology Office Visits:  Recall 04/21/2023 with Dr Gala Romney.  Recall 07/06/2023 with Otilio Saber, PA     Copy of ICM check sent to Dr. Graciela Husbands.   3 month ICM trend: 01/08/2023.    12-14 Month ICM trend:     Karie Soda, RN 01/10/2023 7:48 AM

## 2023-01-12 NOTE — Progress Notes (Signed)
Remote ICD transmission.   

## 2023-01-24 DIAGNOSIS — I5042 Chronic combined systolic (congestive) and diastolic (congestive) heart failure: Secondary | ICD-10-CM | POA: Diagnosis not present

## 2023-01-31 ENCOUNTER — Other Ambulatory Visit (HOSPITAL_COMMUNITY): Payer: Self-pay | Admitting: Internal Medicine

## 2023-02-02 ENCOUNTER — Other Ambulatory Visit: Payer: Self-pay | Admitting: Internal Medicine

## 2023-02-02 DIAGNOSIS — I10 Essential (primary) hypertension: Secondary | ICD-10-CM

## 2023-02-12 ENCOUNTER — Ambulatory Visit: Payer: 59 | Attending: Internal Medicine

## 2023-02-12 DIAGNOSIS — I5022 Chronic systolic (congestive) heart failure: Secondary | ICD-10-CM | POA: Diagnosis not present

## 2023-02-12 DIAGNOSIS — Z95810 Presence of automatic (implantable) cardiac defibrillator: Secondary | ICD-10-CM | POA: Diagnosis not present

## 2023-02-18 NOTE — Progress Notes (Signed)
EPIC Encounter for ICM Monitoring  Patient Name: Bradley [name] is a 67 y.o. male Date: 02/18/2023 Primary Care Physican: Kristian Covey, MD Primary Cardiologist: Bensimhon Electrophysiologist: Graciela Husbands Nephrologist:  Deterding 08/23/2022 Weight: 198.2 lbs 11/13/2022 Weight: 198 lbs                                              Transmission reviewed.    CorVue thoracic impedance suggesting possible dryness starting 7/6 and trending back to baseline.  Suggesting possible fluid accumulation from 6/29-7/5.   Prescribed:  Furosemide 20 mg Take 1 tablet (20 mg total) by mouth 4 (four) times a week.   Per 06/28/2022 OV note from Dr Gala Romney take Furosemide 3 times week + PRN and wife confirmed on 08/23/22 he is taking 3 times week unless 4th dose needed for fluid symptoms.   Farxiga 10 mg take 1 tablet by mouth daily    Labs: 11/17/2022 Creatinine 1.33, BUN 17, Potassium 4.6, Sodium 141 10/23/2022 Creatinine 1.20, BUN 16, Potassium 4.9, Sodium 141, GFR >60 A complete set of results can be found in Results Review.   Recommendations:  No changes.    Follow-up plan: ICM clinic phone appointment on 03/20/2023.   91 day device clinic remote transmission 03/19/2023.     EP/Cardiology Office Visits:  Recall 04/21/2023 with Dr Gala Romney.  Recall 07/06/2023 with Otilio Saber, PA     Copy of ICM check sent to Dr. Graciela Husbands.   3 month ICM trend: 02/12/2023.    12-14 Month ICM trend:     Karie Soda, RN 02/18/2023 8:52 AM

## 2023-03-09 ENCOUNTER — Other Ambulatory Visit: Payer: Self-pay | Admitting: Family Medicine

## 2023-03-09 DIAGNOSIS — R110 Nausea: Secondary | ICD-10-CM

## 2023-03-09 DIAGNOSIS — R140 Abdominal distension (gaseous): Secondary | ICD-10-CM

## 2023-03-12 ENCOUNTER — Encounter: Payer: Self-pay | Admitting: Family Medicine

## 2023-03-12 ENCOUNTER — Ambulatory Visit (INDEPENDENT_AMBULATORY_CARE_PROVIDER_SITE_OTHER): Payer: 59 | Admitting: Family Medicine

## 2023-03-12 VITALS — BP 104/72 | HR 54 | Temp 97.8°F | Ht 72.0 in | Wt 190.9 lb

## 2023-03-12 DIAGNOSIS — R634 Abnormal weight loss: Secondary | ICD-10-CM

## 2023-03-12 DIAGNOSIS — R110 Nausea: Secondary | ICD-10-CM | POA: Diagnosis not present

## 2023-03-12 DIAGNOSIS — R1084 Generalized abdominal pain: Secondary | ICD-10-CM

## 2023-03-12 DIAGNOSIS — K4090 Unilateral inguinal hernia, without obstruction or gangrene, not specified as recurrent: Secondary | ICD-10-CM | POA: Diagnosis not present

## 2023-03-12 NOTE — Progress Notes (Signed)
Established Patient Office Visit  Subjective   Patient ID: Bradley [name], male    DOB: 1955-10-26  Age: 67 y.o. MRN: [id number]  Chief Complaint  Patient presents with   Nausea    Pt c/o of nausea and lack of appetite. Been going on for 1 months.    Anorexia   Groin Pain    Pt reports he has hernia on his r side groin. Off and on for years.     HPI   Bradley [name] has multiple chronic problems including history of combined systolic and diastolic heart failure, hypertension, nonischemic cardiomyopathy, hypothyroidism, chronic kidney disease, history of automatic cardioverter defibrillator, history of aortic valve replacement.  He is seen today with 1 month history of nonspecific symptoms of decreased appetite and nausea without vomiting.  He is also had some fairly diffuse mid abdominal pains.  He states his weight is down about 7 pounds.  Very poor appetite and eating very little for the past several days.  For example, today is only had a piece of [place] toast.  No change in bowel habits.  No bloody stools.  No melena.  No hematemesis.  No history of pancreatitis.  Denies any recent chest pains.  No dyspnea.  No dizziness.  No dysphagia.  No odynophagia.  No active GERD symptoms.  No new medications.  He was actually seen here by another provider in our office back in April with some nausea and abdominal bloating.  Had multiple labs done then including CBC, CMP, TSH which were basically unremarkable.  Also complains of right inguinal bulge.  This to some extent "comes and goes ".  He does not do a lot of straining.  Sometimes slightly painful.  Past Medical History:  Diagnosis Date   Anxiety    Aortic stenosis    s/p Bentall with bioprosthetic AVR 02/2010; Last echo (9/11): Moderate LVH, EF 45-50%, AVR functioning appropriately, aortic valve mean gradient 21, diastolic dysfunction. Chest MRA (2/13): Mild to moderate dilatation at the sinus of Valsalva at 4.1 cm, mild dilatation ascending  aorta distal to the tube graft at 3.9 cm, moderate dilatation of the innominate artery a 2.1 cm;     CHF (congestive heart failure) (HCC)    Depression    ESRD on dialysis (HCC)    a. 09/2013 felt to be related to gentamycin b. no longer requiring HD   Hx of cardiac cath    a. LHC in 02/2010: normal cors   Hx of echocardiogram 02/2014   Echo (8/15):  Mod LV, EF 35-40%, Gr 1 DD, AVR ok (mean 14 mmHg), mild LAE   Hypertension    Hypothyroidism, postsurgical    Prosthetic valve endocarditis (HCC)    Staphylococcus aureus bacteremia    Thyroid cancer (HCC)    Ventricular tachycardia (HCC)    a. s/p STJ ICD   Past Surgical History:  Procedure Laterality Date   AORTIC VALVE REPAIR  1968   AORTIC VALVE REPLACEMENT  2011   AV FISTULA PLACEMENT Left 10/08/2013   Procedure: ARTERIOVENOUS (AV) FISTULA CREATION- LEFT ARM; Radial Cephalic ;  Surgeon: Pryor Ochoa, MD;  Location: [facility] OR;  Service: Vascular;  Laterality: Left;   BENTALL PROCEDURE N/A 10/28/2013   Procedure: REDO BENTALL PROCEDURE, debridment of aoritc root abscess, replacement of aortic root, ascending aorta and aortic valve with homograft. Insertion of left femoral arterial line;  Surgeon: Delight Ovens, MD;  Location: [facility] OR;  Service: Open Heart Surgery;  Laterality: N/A;  BIOPSY  07/22/2019   Procedure: BIOPSY;  Surgeon: Sherrilyn Rist, MD;  Location: [facility];  Service: Gastroenterology;;   CARDIAC CATHETERIZATION  03/02/2010   NORMAL CORONARY ARTERY   CARDIAC CATHETERIZATION N/A 01/01/2015   Procedure: Coronary/Graft Angiography;  Surgeon: Lennette Bihari, MD;  Location: [facility] INVASIVE CV LAB;  Service: Cardiovascular;  Laterality: N/A;   CARDIAC VALVE REPLACEMENT     COLONOSCOPY WITH PROPOFOL N/A 07/22/2019   Procedure: COLONOSCOPY WITH PROPOFOL;  Surgeon: Sherrilyn Rist, MD;  Location: [facility]COPY;  Service: Gastroenterology;  Laterality: N/A;   EP IMPLANTABLE DEVICE N/A 01/01/2015   Procedure: ICD Implant;  Surgeon:  Duke Salvia, MD;  Location: [facility] INVASIVE CV LAB;  Service: Cardiovascular;  Laterality: N/A;   HEMOSTASIS CLIP PLACEMENT  07/22/2019   Procedure: HEMOSTASIS CLIP PLACEMENT;  Surgeon: Sherrilyn Rist, MD;  Location: [facility] END[facility]COPY;  Service: Gastroenterology;;   INTRAOPERATIVE TRANSESOPHAGEAL ECHOCARDIOGRAM N/A 10/28/2013   Procedure: INTRAOPERATIVE TRANSESOPHAGEAL ECHOCARDIOGRAM;  Surgeon: Delight Ovens, MD;  Location: [facility] OR;  Service: Open Heart Surgery;  Laterality: N/A;   PERIPHERAL VASCULAR CATHETERIZATION N/A 01/01/2015   Procedure: Aortic Arch Angiography;  Surgeon: Lennette Bihari, MD;  Location: [facility] INVASIVE CV LAB;  Service: Cardiovascular;  Laterality: N/A;   POLYPECTOMY  07/22/2019   Procedure: POLYPECTOMY;  Surgeon: Sherrilyn Rist, MD;  Location: [facility] ENDOSCOPY;  Service: Gastroenterology;;   SHOULDER ARTHROSCOPY W/ ROTATOR CUFF REPAIR Right 2012   STERNOTOMY     REDO   TEE WITHOUT CARDIOVERSION N/A 10/24/2013   Procedure: TRANSESOPHAGEAL ECHOCARDIOGRAM (TEE);  Surgeon: Lars Masson, MD;  Location: [facility] ENDOSCOPY;  Service: Cardiovascular;  Laterality: N/A;   THYROIDECTOMY  ~ 2005   TRANSTHORACIC ECHOCARDIOGRAM  03/2010   SHOWED MILD REDUCTION OF LV FUNCTION    reports that he has never smoked. He has never used smokeless tobacco. He reports that he does not drink alcohol and does not use drugs. family history includes Heart disease in his father; Hypertension in his father and mother; Kidney Stones in his sister; Stroke in his father; Thyroid cancer in his sister and sister. Allergies  Allergen Reactions   Oxycodone Other (See Comments)    Gives patient nightmares   Rifampin Nausea Only      Review of Systems  Constitutional:  Positive for malaise/fatigue and weight loss. Negative for chills and fever.  Respiratory:  Negative for cough.   Cardiovascular:  Positive for chest pain and palpitations. Negative for leg swelling.  Gastrointestinal:  Positive for abdominal  pain and nausea. Negative for blood in stool, constipation, diarrhea, heartburn, melena and vomiting.  Genitourinary:  Negative for dysuria and flank pain.      Objective:     BP 104/72 (BP Location: Right Arm, Patient Position: Sitting, Cuff Size: Normal)   Pulse (!) 54   Temp 97.8 F (36.6 C) (Oral)   Ht 6' (1.829 m)   Wt 190 lb 14.4 oz (86.6 kg)   SpO2 98%   BMI 25.89 kg/m  BP Readings from Last 3 Encounters:  03/12/23 104/72  11/17/22 (!) 140/80  10/23/22 132/88   Wt Readings from Last 3 Encounters:  03/12/23 190 lb 14.4 oz (86.6 kg)  11/17/22 197 lb 12.8 oz (89.7 kg)  10/23/22 201 lb 3.2 oz (91.3 kg)      Physical Exam Vitals reviewed.  Constitutional:      General: He is not in acute distress.    Appearance: Normal appearance. He is not ill-appearing.  Cardiovascular:     Rate and Rhythm: Normal rate and regular rhythm.  Pulmonary:     Effort: Pulmonary effort is normal.     Breath sounds: Normal breath sounds.  Abdominal:     Comments: Normal bowel sounds.  Nondistended.  Soft with no reproducible tenderness on exam.  No hepatomegaly or splenomegaly noted.  Genitourinary:    Comments: He has fairly large right inguinal hernia which is soft and nontender for the most part.  Only minimally tender to palpation. Musculoskeletal:     Right lower leg: No edema.     Left lower leg: No edema.  Neurological:     Mental Status: He is alert.      No results found for any visits on 03/12/23.  Last CBC Lab Results  Component Value Date   WBC 7.7 11/17/2022   HGB 17.8 (H) 11/17/2022   HCT 53.3 (H) 11/17/2022   MCV 95.3 11/17/2022   MCH 30.9 09/16/2021   RDW 13.9 11/17/2022   PLT 173.0 11/17/2022   Last metabolic panel Lab Results  Component Value Date   GLUCOSE 91 11/17/2022   NA 141 11/17/2022   K 4.6 11/17/2022   CL 106 11/17/2022   CO2 22 11/17/2022   BUN 17 11/17/2022   CREATININE 1.33 11/17/2022   GFR 55.98 (L) 11/17/2022   CALCIUM 9.9  11/17/2022   PHOS 4.8 (H) 11/28/2013   PROT 7.3 11/17/2022   ALBUMIN 4.3 11/17/2022   BILITOT 0.4 11/17/2022   ALKPHOS 59 11/17/2022   AST 32 11/17/2022   ALT 33 11/17/2022   ANIONGAP 9 10/23/2022   Last lipids Lab Results  Component Value Date   CHOL 137 04/12/2022   HDL 46.00 04/12/2022   LDLCALC 72 04/12/2022   TRIG 97.0 04/12/2022   CHOLHDL 3 04/12/2022   Last thyroid functions Lab Results  Component Value Date   TSH 4.11 11/17/2022   T3TOTAL 29.1 (L) 09/14/2013      The ASCVD Risk score (Arnett DK, et al., 2019) failed to calculate for the following reasons:   The patient has a prior MI or stroke diagnosis    Assessment & Plan:   #32 67 year old male with complicated past medical history as above who presents with over 1 month history of decreased appetite, nausea without vomiting, modest weight loss, and poorly localized somewhat diffuse upper abdominal pain..  It looks like perhaps his symptoms have been going on even longer and looking back over notes from April when he was seen for nausea at that point.  Blood work at that time was unremarkable  Given duration of symptoms with some documented weight loss set up CT abdomen pelvis to further evaluate.  If unrevealing consider GI referral  #2 right inguinal hernia.  Soft and minimally tender at this time.  Patient does wish to consult with general surgeon regarding possible repair.  This has been enlarging over quite some time.  Nonstrangulated at this time. -Reviewed signs and symptoms of strangulation -Set up general surgery consult  Evelena Peat, MD

## 2023-03-12 NOTE — Patient Instructions (Signed)
I will be setting up CT abdomen and pelvis to further evaluate the nausea, loss of appetite, and abdominal pain.

## 2023-03-16 ENCOUNTER — Ambulatory Visit: Admission: RE | Admit: 2023-03-16 | Payer: 59 | Source: Ambulatory Visit

## 2023-03-16 DIAGNOSIS — R1084 Generalized abdominal pain: Secondary | ICD-10-CM | POA: Diagnosis not present

## 2023-03-16 DIAGNOSIS — R110 Nausea: Secondary | ICD-10-CM

## 2023-03-16 DIAGNOSIS — R634 Abnormal weight loss: Secondary | ICD-10-CM

## 2023-03-16 DIAGNOSIS — I700 Atherosclerosis of aorta: Secondary | ICD-10-CM | POA: Diagnosis not present

## 2023-03-16 DIAGNOSIS — K5790 Diverticulosis of intestine, part unspecified, without perforation or abscess without bleeding: Secondary | ICD-10-CM | POA: Diagnosis not present

## 2023-03-16 MED ORDER — IOPAMIDOL (ISOVUE-300) INJECTION 61%
100.0000 mL | Freq: Once | INTRAVENOUS | Status: AC | PRN
  Administered 2023-03-16: 100 mL via INTRAVENOUS

## 2023-03-17 ENCOUNTER — Other Ambulatory Visit: Payer: Self-pay | Admitting: Internal Medicine

## 2023-03-17 ENCOUNTER — Telehealth: Payer: Self-pay | Admitting: Physician Assistant

## 2023-03-17 NOTE — Telephone Encounter (Signed)
Patient is wife paged after hour answering service complaining of possible allergic reaction.  Patient underwent CT of abdomen with contrast yesterday and woke up this morning having severe facial swelling.  He denies any significant shortness of breath.  His wife says his face is really really swollen, however not sure to what degree.  I instructed the wife to give course of Benadryl for now and take him to either urgent care or [facility] so he can be evaluated by medical provider.  Fortunately he does not have respiratory compromise at this point, therefore I do not think he need EMS transportation.  I asked him to temporarily hold Entresto, Lasix and Farxiga this morning just in case his blood pressure drops.  He can resume the medications tonight or tomorrow morning.  Depending on the degree of facial swelling, he may need a steroid treatment.

## 2023-03-18 ENCOUNTER — Encounter (HOSPITAL_BASED_OUTPATIENT_CLINIC_OR_DEPARTMENT_OTHER): Payer: Self-pay | Admitting: Emergency Medicine

## 2023-03-18 ENCOUNTER — Emergency Department (HOSPITAL_BASED_OUTPATIENT_CLINIC_OR_DEPARTMENT_OTHER)
Admission: EM | Admit: 2023-03-18 | Discharge: 2023-03-18 | Disposition: A | Discharge status: Home / Self Care | Payer: 59 | Attending: Emergency Medicine | Admitting: Emergency Medicine

## 2023-03-18 ENCOUNTER — Other Ambulatory Visit: Payer: Self-pay | Admitting: Internal Medicine

## 2023-03-18 ENCOUNTER — Emergency Department (HOSPITAL_BASED_OUTPATIENT_CLINIC_OR_DEPARTMENT_OTHER): Payer: 59 | Admitting: Radiology

## 2023-03-18 DIAGNOSIS — R220 Localized swelling, mass and lump, head: Secondary | ICD-10-CM | POA: Insufficient documentation

## 2023-03-18 DIAGNOSIS — Z7982 Long term (current) use of aspirin: Secondary | ICD-10-CM | POA: Diagnosis not present

## 2023-03-18 DIAGNOSIS — R21 Rash and other nonspecific skin eruption: Secondary | ICD-10-CM | POA: Insufficient documentation

## 2023-03-18 DIAGNOSIS — Z85850 Personal history of malignant neoplasm of thyroid: Secondary | ICD-10-CM | POA: Diagnosis not present

## 2023-03-18 DIAGNOSIS — N189 Chronic kidney disease, unspecified: Secondary | ICD-10-CM | POA: Insufficient documentation

## 2023-03-18 DIAGNOSIS — T508X5A Adverse effect of diagnostic agents, initial encounter: Secondary | ICD-10-CM | POA: Insufficient documentation

## 2023-03-18 DIAGNOSIS — I509 Heart failure, unspecified: Secondary | ICD-10-CM | POA: Diagnosis not present

## 2023-03-18 DIAGNOSIS — Z950 Presence of cardiac pacemaker: Secondary | ICD-10-CM | POA: Diagnosis not present

## 2023-03-18 LAB — BRAIN NATRIURETIC PEPTIDE: B Natriuretic Peptide: 194.9 pg/mL — ABNORMAL HIGH (ref 0.0–100.0)

## 2023-03-18 LAB — CBC
HCT: 50.1 % (ref 39.0–52.0)
Hemoglobin: 17.7 g/dL — ABNORMAL HIGH (ref 13.0–17.0)
MCH: 31.8 pg (ref 26.0–34.0)
MCHC: 35.3 g/dL (ref 30.0–36.0)
MCV: 89.9 fL (ref 80.0–100.0)
Platelets: 166 10*3/uL (ref 150–400)
RBC: 5.57 MIL/uL (ref 4.22–5.81)
RDW: 13.7 % (ref 11.5–15.5)
WBC: 6.5 10*3/uL (ref 4.0–10.5)
nRBC: 0 % (ref 0.0–0.2)

## 2023-03-18 LAB — BASIC METABOLIC PANEL
Anion gap: 9 (ref 5–15)
BUN: 13 mg/dL (ref 8–23)
CO2: 20 mmol/L — ABNORMAL LOW (ref 22–32)
Calcium: 9.4 mg/dL (ref 8.9–10.3)
Chloride: 110 mmol/L (ref 98–111)
Creatinine, Ser: 1.09 mg/dL (ref 0.61–1.24)
GFR, Estimated: >60 mL/min (ref >60)
Glucose, Bld: 125 mg/dL — ABNORMAL HIGH (ref 70–99)
Potassium: 4.5 mmol/L (ref 3.5–5.1)
Sodium: 139 mmol/L (ref 135–145)

## 2023-03-18 MED ORDER — METHYLPREDNISOLONE SODIUM SUCC 125 MG IJ SOLR
125.0000 mg | INTRAMUSCULAR | Status: AC
  Administered 2023-03-18: 125 mg via INTRAVENOUS
  Filled 2023-03-18: qty 2

## 2023-03-18 MED ORDER — FAMOTIDINE IN NACL 20-0.9 MG/50ML-% IV SOLN
20.0000 mg | Freq: Once | INTRAVENOUS | Status: AC
  Administered 2023-03-18: 20 mg via INTRAVENOUS
  Filled 2023-03-18: qty 50

## 2023-03-18 NOTE — ED Notes (Signed)
While RN in room drawing blood, patient became diaphoretic ,nauseated, clammy,anxious.

## 2023-03-18 NOTE — Discharge Instructions (Signed)
You were seen for your facial swelling and redness in the emergency department.  It may have been a reaction to the contrast dye that you received.  At home, please take Benadryl for any itching.    Check your MyChart online for the results of any tests that had not resulted by the time you left the emergency department.   Follow-up with your primary doctor in 2-3 days regarding your visit.    Return immediately to the emergency department if you experience any of the following: Difficulty breathing, or any other concerning symptoms.    Thank you for visiting our Emergency Department. It was a pleasure taking care of you today.

## 2023-03-18 NOTE — ED Notes (Signed)
Pt transported to xray 

## 2023-03-18 NOTE — ED Triage Notes (Addendum)
Pt presents to ED POV. Pt c/o facial swelling since saturday morning. Denies itchiness. Pt reports taking benadryl w/o relief. Pt reports swelling from CHF typically in abnormal places no LE. No swelling circumoral. Denies difficulty breathing. Skipped lasix on Saturday per PCP.

## 2023-03-18 NOTE — ED Notes (Signed)
Pt d/c home per MD order. Discharge summary reviewed, pt verbalizes understanding. Ambulatory off unit. No s/s of acute distress noted. Discharged home with wife.

## 2023-03-18 NOTE — ED Provider Notes (Signed)
[place] EMERGENCY DEPARTMENT AT [facility] Provider Note   CSN: [id number] Arrival date & time: 03/18/23  1058     History  Chief Complaint  Patient presents with   Facial Swelling    Bradley [name] is a 67 y.o. male.  67 year old male with a history of heart failure, valve replacement, ICD, CKD, and thyroid cancer sp resection who presents to the emergency department with facial swelling.  Patient reports that he has had longstanding dyspepsia and nausea.  No vomiting.  Went for a CT of the abdomen pelvis with IV and p.o. contrast on Friday.  Saturday woke up and noticed some redness of his face and arms and back as well as some swelling of his face.  Called cardiology who recommended bringing him to the new the emergency department or urgent care for evaluation and giving him Benadryl.  Has received 3 doses of Benadryl but has persistent redness and swelling of his face today.  No difficulty breathing, no vomiting or diarrhea.  Rash does not itch and is not painful.  Sometimes has swelling and atypical places due to his heart failure according to his wife.      Home Medications Prior to Admission medications   Medication Sig Start Date End Date Taking? Authorizing Provider  acetaminophen (TYLENOL) 325 MG tablet Take 325 mg by mouth 2 (two) times daily as needed for moderate pain or headache.    [provider]  ARIPiprazole (ABILIFY) 5 MG tablet Take 5 mg by mouth daily.    [provider]  aspirin EC 81 MG tablet Take 81 mg by mouth at bedtime.     [provider]  atorvastatin (LIPITOR) 40 MG tablet TAKE 1 TABLET BY MOUTH EVERY DAY 06/28/22   Burchette, Elberta Fortis, MD  carvedilol (COREG) 6.25 MG tablet TAKE 1 TABLET BY MOUTH TWICE A DAY WITH FOOD 12/11/22   Bensimhon, Bevelyn Buckles, MD  clonazePAM (KLONOPIN) 1 MG tablet TAKE 1 TABLET BY MOUTH TWICE A DAY *NOT FOR REGULAR USE* Patient taking differently: Patient takes 1 tablet 3 times per week as  needed. 09/26/22   Philip Aspen, Limmie Patricia, MD  docusate sodium (COLACE) 100 MG capsule Take 100 mg by mouth daily as needed.    [provider]  doxazosin (CARDURA) 1 MG tablet Take 1 tablet (1 mg total) by mouth daily. For SBP greater than 140 11/06/22   Bensimhon, Bevelyn Buckles, MD  Ensure (ENSURE) Take 237 mLs by mouth daily as needed. 04/25/19   Burchette, Elberta Fortis, MD  ENTRESTO 97-103 MG TAKE 1 TABLET BY MOUTH TWICE A DAY 01/31/23   Bensimhon, Bevelyn Buckles, MD  FARXIGA 10 MG TABS tablet TAKE 1 TABLET BY MOUTH EVERY DAY 04/07/22   Bensimhon, Bevelyn Buckles, MD  furosemide (LASIX) 20 MG tablet TAKE 1 TABLET BY MOUTH 4 TIMES A WEEK. Patient taking differently: PRN 02/02/23   Bensimhon, Bevelyn Buckles, MD  levothyroxine (SYNTHROID) 150 MCG tablet Take 1 tablet (150 mcg total) by mouth daily. 04/13/22   Burchette, Elberta Fortis, MD  mexiletine (MEXITIL) 150 MG capsule TAKE 2 CAPSULES BY MOUTH EVERY MORNING AND 2 CAPSULES BY MOUTH EVERY EVENING. 03/24/22   Bensimhon, Bevelyn Buckles, MD  omeprazole (PRILOSEC) 20 MG capsule TAKE 1 CAPSULE BY MOUTH EVERY DAY 03/09/23   Burchette, Elberta Fortis, MD  ranolazine (RANEXA) 500 MG 12 hr tablet TAKE 1 TABLET BY MOUTH TWICE A DAY 08/02/22   Duke Salvia, MD  Salicylic Acid (WART REMOVER EX) Apply  1 application topically daily as needed (foot corns). Patient not taking: Reported on 03/12/2023    [provider]  sertraline (ZOLOFT) 50 MG tablet TAKE 1 TABLET BY MOUTH EVERY DAY 08/14/22   Burchette, Elberta Fortis, MD      Allergies    Oxycodone, Iodinated contrast media, and Rifampin    Review of Systems   Review of Systems  Physical Exam Updated Vital Signs BP 107/79   Pulse (!) 56   Temp 97.9 F (36.6 C) (Oral)   Resp (!) 21   SpO2 100%  Physical Exam Vitals and nursing note reviewed.  Constitutional:      General: He is not in acute distress.    Appearance: He is well-developed.  HENT:     Head: Normocephalic and atraumatic.     Right Ear: External ear normal.     Left Ear:  External ear normal.     Nose: Nose normal.  Eyes:     Extraocular Movements: Extraocular movements intact.     Conjunctiva/sclera: Conjunctivae normal.     Pupils: Pupils are equal, round, and reactive to light.  Cardiovascular:     Rate and Rhythm: Normal rate and regular rhythm.     Heart sounds: Murmur heard.  Pulmonary:     Effort: Pulmonary effort is normal. No respiratory distress.     Breath sounds: Normal breath sounds.  Abdominal:     General: There is no distension.     Palpations: There is no mass.     Tenderness: There is no abdominal tenderness. There is no guarding.  Musculoskeletal:     Cervical back: Normal range of motion and neck supple.     Right lower leg: No edema.     Left lower leg: No edema.  Skin:    General: Skin is warm and dry.     Findings: Rash (Slight erythema of the face and upper back and upper chest.  No urticaria noted.) present.  Neurological:     Mental Status: He is alert. Mental status is at baseline.  Psychiatric:        Mood and Affect: Mood normal.        Behavior: Behavior normal.     ED Results / Procedures / Treatments   Labs (all labs ordered are listed, but only abnormal results are displayed) Labs Reviewed  BASIC METABOLIC PANEL - Abnormal; Notable for the following components:      Result Value   CO2 20 (*)    Glucose, Bld 125 (*)    All other components within normal limits  CBC - Abnormal; Notable for the following components:   Hemoglobin 17.7 (*)    All other components within normal limits  BRAIN NATRIURETIC PEPTIDE - Abnormal; Notable for the following components:   B Natriuretic Peptide 194.9 (*)    All other components within normal limits    EKG EKG Interpretation Date/Time:  Sunday March 18 2023 11:21:57 EDT Ventricular Rate:  55 PR Interval:  148 QRS Duration:  163 QT Interval:  438 QTC Calculation: 419 R Axis:   85  Text Interpretation: Sinus or ectopic atrial rhythm Right bundle branch block  Confirmed by Vonita Moss [phone]) on 03/18/2023 12:22:51 PM  Radiology DG Chest 2 View  Result Date: 03/18/2023 CLINICAL DATA:  Facial swelling 2 days. EXAM: CHEST - 2 VIEW COMPARISON:  10/16/2020 FINDINGS: Sternotomy wires and left-sided pacemaker unchanged. Lungs are adequately inflated and otherwise clear. Cardiomediastinal silhouette and remainder of the exam is unchanged.  IMPRESSION: No acute cardiopulmonary disease. Electronically Signed   By: Elberta Fortis M.D.   On: 03/18/2023 12:42    Procedures Procedures    Medications Ordered in ED Medications  famotidine (PEPCID) IVPB 20 mg premix (0 mg Intravenous Stopped 03/18/23 1311)  methylPREDNISolone sodium succinate (SOLU-MEDROL) 125 mg/2 mL injection 125 mg (125 mg Intravenous Given 03/18/23 1221)    ED Course/ Medical Decision Making/ A&P Clinical Course as of 03/18/23 1436  Sun Mar 18, 2023  1254 B Natriuretic Peptide(!): 194.9 Below baseline in the mid 200s [RP]    Clinical Course User Index [RP] Rondel Baton, MD                                 Medical Decision Making Amount and/or Complexity of Data Reviewed Labs: ordered. Decision-making details documented in ED Course. Radiology: ordered.  Risk Prescription drug management.   AKEEN BEH is a 67 y.o. male with comorbidities that complicate the patient evaluation including heart failure, valve replacement, ICD, CKD, and thyroid cancer sp resection who presents with rash and facial swelling  Initial Ddx:  Contrast reaction, SVC syndrome, infection, volume overload  MDM/Course:  Patient presented to the emergency department with facial swelling in the setting of getting IV and oral contrast for some abdominal discomfort he had been having.  No other symptoms of anaphylaxis.  On exam does appear to have some mild facial swelling and rash on his upper extremities.  No significant neck swelling to suggest recurrence of his thyroid cancer.  Said that he  sometimes will get a fluid accumulation in his face with his heart failure but his BNP is less than usual and his chest x-ray did not show any evidence of volume overload.  No signs of masses that would be causing SVC syndrome.  Upon re-evaluation after receiving Solu-Medrol and Pepcid he reported his symptoms had improved.  Do feel it is likely a reaction to the IV contrast.  Will him follow-up with his primary doctor in several days regarding his symptoms.  Counseled on return precautions prior to discharge.  This patient presents to the ED for concern of complaints listed in HPI, this involves an extensive number of treatment options, and is a complaint that carries with it a high risk of complications and morbidity. Disposition including potential need for admission considered.   Dispo: DC Home. Return precautions discussed including, but not limited to, those listed in the AVS. Allowed pt time to ask questions which were answered fully prior to dc.  Additional history obtained from spouse Records reviewed Outpatient Clinic Notes The following labs were independently interpreted: Chemistry and show no acute abnormality I independently reviewed the following imaging with scope of interpretation limited to determining acute life threatening conditions related to emergency care: Chest x-ray and agree with the radiologist interpretation with the following exceptions: none I personally reviewed and interpreted the pt's EKG: see above for interpretation  I have reviewed the patients home medications and made adjustments as needed Social Determinants of health:  Elderly         Final Clinical Impression(s) / ED Diagnoses Final diagnoses:  Adverse effect of contrast media, initial encounter  Facial swelling    Rx / DC Orders ED Discharge Orders     None         Rondel Baton, MD 03/18/23 1436

## 2023-03-19 ENCOUNTER — Ambulatory Visit (INDEPENDENT_AMBULATORY_CARE_PROVIDER_SITE_OTHER): Payer: 59

## 2023-03-19 DIAGNOSIS — I428 Other cardiomyopathies: Secondary | ICD-10-CM

## 2023-03-19 LAB — CUP PACEART REMOTE DEVICE CHECK
Battery Remaining Longevity: 11 mo
Battery Remaining Percentage: 10 %
Battery Voltage: 2.68 V
Brady Statistic RV Percent Paced: 1 %
Date Time Interrogation Session: 20240819020017
HighPow Impedance: 69 Ohm
HighPow Impedance: 69 Ohm
Implantable Lead Connection Status: 753985
Implantable Lead Implant Date: 20160603
Implantable Lead Location: 753860
Implantable Lead Model: 7122
Implantable Pulse Generator Implant Date: 20160603
Lead Channel Impedance Value: 450 Ohm
Lead Channel Pacing Threshold Amplitude: 1.5 V
Lead Channel Pacing Threshold Pulse Width: 0.5 ms
Lead Channel Sensing Intrinsic Amplitude: 8.7 mV
Lead Channel Setting Pacing Amplitude: 2.5 V
Lead Channel Setting Pacing Pulse Width: 0.5 ms
Lead Channel Setting Sensing Sensitivity: 0.5 mV
Pulse Gen Serial Number: 7135169

## 2023-03-20 ENCOUNTER — Telehealth: Payer: Self-pay

## 2023-03-20 ENCOUNTER — Ambulatory Visit: Payer: 59 | Attending: Internal Medicine

## 2023-03-20 DIAGNOSIS — Z95810 Presence of automatic (implantable) cardiac defibrillator: Secondary | ICD-10-CM

## 2023-03-20 DIAGNOSIS — I5022 Chronic systolic (congestive) heart failure: Secondary | ICD-10-CM

## 2023-03-20 NOTE — Progress Notes (Signed)
EPIC Encounter for ICM Monitoring  Patient Name: Bradley [name] is a 67 y.o. male Date: 03/20/2023 Primary Care Physican: Kristian Covey, MD Primary Cardiologist: Bensimhon Electrophysiologist: Graciela Husbands Nephrologist:  Deterding 08/23/2022 Weight: 198.2 lbs 11/13/2022 Weight: 198 lbs                                              Attempted call to wife/patient and unable to reach.  Transmission reviewed.    CorVue thoracic impedance suggesting possible dryness starting 7/27.   Prescribed:  Furosemide 20 mg Take 1 tablet (20 mg total) by mouth 4 (four) times a week.   Per 06/28/2022 OV note from Dr Gala Romney take Furosemide 3 times week + PRN and wife confirmed on 08/23/22 he is taking 3 times week unless 4th dose needed for fluid symptoms.   Farxiga 10 mg take 1 tablet by mouth daily    Labs: 11/17/2022 Creatinine 1.33, BUN 17, Potassium 4.6, Sodium 141 10/23/2022 Creatinine 1.20, BUN 16, Potassium 4.9, Sodium 141, GFR >60 A complete set of results can be found in Results Review.   Recommendations:  Unable to reach.     Follow-up plan: ICM clinic phone appointment on 04/03/2023 to recheck fluid levels (will be checked at HF clinic 8/23 appt).   91 day device clinic remote transmission 06/18/2023.     EP/Cardiology Office Visits:  Recall 04/21/2023 with Dr Gala Romney.  Recall 07/06/2023 with Otilio Saber, PA     Copy of ICM check sent to Dr. Graciela Husbands.   Will send copy to HF clinic for review if patient is reached.   3 month ICM trend: 03/19/2023.    12-14 Month ICM trend:     Karie Soda, RN 03/20/2023 2:48 PM

## 2023-03-20 NOTE — Telephone Encounter (Signed)
Remote ICM transmission received.  Attempted call to patient regarding ICM remote transmission and no answer.  

## 2023-03-23 ENCOUNTER — Encounter (HOSPITAL_COMMUNITY): Payer: 59

## 2023-03-25 ENCOUNTER — Emergency Department (HOSPITAL_BASED_OUTPATIENT_CLINIC_OR_DEPARTMENT_OTHER): Payer: 59

## 2023-03-25 ENCOUNTER — Encounter (HOSPITAL_BASED_OUTPATIENT_CLINIC_OR_DEPARTMENT_OTHER): Payer: Self-pay | Admitting: Emergency Medicine

## 2023-03-25 ENCOUNTER — Emergency Department (HOSPITAL_BASED_OUTPATIENT_CLINIC_OR_DEPARTMENT_OTHER)
Admission: EM | Admit: 2023-03-25 | Discharge: 2023-03-25 | Disposition: A | Discharge status: Home / Self Care | Payer: 59 | Attending: Emergency Medicine | Admitting: Emergency Medicine

## 2023-03-25 DIAGNOSIS — E039 Hypothyroidism, unspecified: Secondary | ICD-10-CM | POA: Insufficient documentation

## 2023-03-25 DIAGNOSIS — Z7982 Long term (current) use of aspirin: Secondary | ICD-10-CM | POA: Diagnosis not present

## 2023-03-25 DIAGNOSIS — R9389 Abnormal findings on diagnostic imaging of other specified body structures: Secondary | ICD-10-CM | POA: Diagnosis not present

## 2023-03-25 DIAGNOSIS — I110 Hypertensive heart disease with heart failure: Secondary | ICD-10-CM | POA: Diagnosis not present

## 2023-03-25 DIAGNOSIS — I509 Heart failure, unspecified: Secondary | ICD-10-CM | POA: Insufficient documentation

## 2023-03-25 DIAGNOSIS — Z79899 Other long term (current) drug therapy: Secondary | ICD-10-CM | POA: Insufficient documentation

## 2023-03-25 DIAGNOSIS — R059 Cough, unspecified: Secondary | ICD-10-CM | POA: Diagnosis not present

## 2023-03-25 DIAGNOSIS — R0989 Other specified symptoms and signs involving the circulatory and respiratory systems: Secondary | ICD-10-CM | POA: Diagnosis not present

## 2023-03-25 DIAGNOSIS — U071 COVID-19: Secondary | ICD-10-CM

## 2023-03-25 LAB — RESP PANEL BY RT-PCR (RSV, FLU A&B, COVID)  RVPGX2
Influenza A by PCR: NEGATIVE
Influenza B by PCR: NEGATIVE
Resp Syncytial Virus by PCR: NEGATIVE
SARS Coronavirus 2 by RT PCR: POSITIVE — AB

## 2023-03-25 MED ORDER — ACETAMINOPHEN 500 MG PO TABS
1000.0000 mg | ORAL_TABLET | Freq: Once | ORAL | Status: AC
  Administered 2023-03-25: 1000 mg via ORAL
  Filled 2023-03-25: qty 2

## 2023-03-25 NOTE — ED Triage Notes (Signed)
+   covid test at home. Started symptoms of cough and body aches yesterday. Denies fevers.denies sob Has not taken anything otc

## 2023-03-25 NOTE — Discharge Instructions (Addendum)
Thank you for letting us take care of you today.  You tested positive for COVID-19.  We do not see a pneumonia on your x-ray.  Your oxygen was good when you were both at rest and with walking around.  COVID is a virus that we treat symptomatically.  Please see instructions below on how to manage her symptoms at home.  If you develop worsening symptoms such as chest pain or difficulty breathing return to the nearest ED for reevaluation.   Viral Illness TREATMENT  Treatment is directed at relieving symptoms. There is no cure. Antibiotics are not effective because the infection is caused by a virus, not by bacteria. Treatment may include:  Increased fluid intake. Sports drinks offer valuable electrolytes, sugars, and fluids. Examples include Gatorade, Powerade, Pedialyte, etc. Water is also a good choice. Breathing heated mist or steam (vaporizer or shower).  Eating chicken soup or other clear broths and maintaining good nutrition.  Getting plenty of rest.  Using gargles or lozenges for comfort.  Increasing usage of your inhaler if you have bronchitis or asthma.  You may return to work when your temperature has returned to normal.  Gargle warm salt water and spit it out for sore throat. Benadryl can help decrease sinus secretions. Continue to alternate between Tylenol and ibuprofen for fever control, pain, and discomfort. For adults, you can take up to 1000mg  Tylenol every 6 hours and/or 600mg  ibuprofen every 6 hours. Do not take more than 4000mg  Tylenol or 3200mg  ibuprofen in 24 hours. For children, medication should be weight dosed. Please refer to attached dosing charts and/or medication bottle for appropriate dosing.  Avoid being around anyone who is immunocompromised or elderly as they are at increased risk of complications due to COVID-19  Follow up with your primary care doctor in 5-7 days for recheck of ongoing symptoms.  Return to emergency department for emergent changing or worsening of  symptoms.

## 2023-03-25 NOTE — ED Provider Notes (Signed)
[place] EMERGENCY DEPARTMENT AT [facility] Provider Note   CSN: [id number] Arrival date & time: 03/25/23  1149     History    Bradley [name] is a 67 y.o. male with past medical history hypertension, hypothyroidism, anxiety, heart failure well-controlled per patient who presents to the ED complaining of cough and congestion since yesterday.  Also complains of mild generalized bodyaches but no chest pain, abdominal pain, nausea, vomiting, fever, shortness of breath, diarrhea, or other concerns.  Overall states that symptoms are mild and he had a positive COVID test at home so came to the ED to make sure that he has COVID.  No known sick contacts.       Home Medications Prior to Admission medications   Medication Sig Start Date End Date Taking? Authorizing Provider  acetaminophen (TYLENOL) 325 MG tablet Take 325 mg by mouth 2 (two) times daily as needed for moderate pain or headache.    [provider]  ARIPiprazole (ABILIFY) 5 MG tablet Take 5 mg by mouth daily.    [provider]  aspirin EC 81 MG tablet Take 81 mg by mouth at bedtime.     [provider]  atorvastatin (LIPITOR) 40 MG tablet TAKE 1 TABLET BY MOUTH EVERY DAY 06/28/22   Burchette, Elberta Fortis, MD  carvedilol (COREG) 6.25 MG tablet TAKE 1 TABLET BY MOUTH TWICE A DAY WITH FOOD 12/11/22   Bensimhon, Bevelyn Buckles, MD  clonazePAM (KLONOPIN) 1 MG tablet TAKE 1 TABLET BY MOUTH TWICE A DAY *NOT FOR REGULAR USE* Patient taking differently: Patient takes 1 tablet 3 times per week as needed. 09/26/22   Philip Aspen, Limmie Patricia, MD  docusate sodium (COLACE) 100 MG capsule Take 100 mg by mouth daily as needed.    [provider]  doxazosin (CARDURA) 1 MG tablet Take 1 tablet (1 mg total) by mouth daily. For SBP greater than 140 11/06/22   Bensimhon, Bevelyn Buckles, MD  Ensure (ENSURE) Take 237 mLs by mouth daily as needed. 04/25/19   Burchette, Elberta Fortis, MD  ENTRESTO 97-103 MG TAKE 1 TABLET BY MOUTH  TWICE A DAY 01/31/23   Bensimhon, Bevelyn Buckles, MD  FARXIGA 10 MG TABS tablet TAKE 1 TABLET BY MOUTH EVERY DAY 03/19/23   Bensimhon, Bevelyn Buckles, MD  furosemide (LASIX) 20 MG tablet TAKE 1 TABLET BY MOUTH 4 TIMES A WEEK. Patient taking differently: PRN 02/02/23   Bensimhon, Bevelyn Buckles, MD  levothyroxine (SYNTHROID) 150 MCG tablet Take 1 tablet (150 mcg total) by mouth daily. 04/13/22   Burchette, Elberta Fortis, MD  mexiletine (MEXITIL) 150 MG capsule TAKE 2 CAPSULES BY MOUTH EVERY MORNING AND 2 CAPSULES BY MOUTH EVERY EVENING. 03/19/23   Bensimhon, Bevelyn Buckles, MD  omeprazole (PRILOSEC) 20 MG capsule TAKE 1 CAPSULE BY MOUTH EVERY DAY 03/09/23   Burchette, Elberta Fortis, MD  ranolazine (RANEXA) 500 MG 12 hr tablet TAKE 1 TABLET BY MOUTH TWICE A DAY 08/02/22   Duke Salvia, MD  Salicylic Acid (WART REMOVER EX) Apply 1 application topically daily as needed (foot corns). Patient not taking: Reported on 03/12/2023    [provider]  sertraline (ZOLOFT) 50 MG tablet TAKE 1 TABLET BY MOUTH EVERY DAY 08/14/22   Burchette, Elberta Fortis, MD      Allergies    Oxycodone, Iodinated contrast media, and Rifampin    Review of Systems   Review of Systems  All other systems reviewed and are negative.   Physical Exam Updated Vital Signs BP  118/84 (BP Location: Right Arm)   Pulse 67   Temp 97.7 F (36.5 C) (Oral)   Resp 18   SpO2 95%  Physical Exam Vitals and nursing note reviewed.  Constitutional:      General: He is not in acute distress.    Appearance: Normal appearance. He is not ill-appearing, toxic-appearing or diaphoretic.  HENT:     Head: Normocephalic and atraumatic.     Mouth/Throat:     Mouth: Mucous membranes are moist.  Eyes:     Conjunctiva/sclera: Conjunctivae normal.  Cardiovascular:     Rate and Rhythm: Normal rate and regular rhythm.     Heart sounds: No murmur heard. Pulmonary:     Effort: Pulmonary effort is normal. No respiratory distress.     Breath sounds: Normal breath sounds. No stridor. No  wheezing, rhonchi or rales.  Abdominal:     General: Abdomen is flat. There is no distension.     Palpations: Abdomen is soft.     Tenderness: There is no abdominal tenderness. There is no guarding or rebound.  Musculoskeletal:        General: Normal range of motion.     Cervical back: Normal range of motion and neck supple. No rigidity.     Right lower leg: No edema.     Left lower leg: No edema.  Skin:    General: Skin is warm and dry.     Capillary Refill: Capillary refill takes less than 2 seconds.  Neurological:     Mental Status: He is alert. Mental status is at baseline.  Psychiatric:        Behavior: Behavior normal.     ED Results / Procedures / Treatments   Labs (all labs ordered are listed, but only abnormal results are displayed) Labs Reviewed  RESP PANEL BY RT-PCR (RSV, FLU A&B, COVID)  [id number] - Abnormal; Notable for the following components:      Result Value   SARS Coronavirus 2 by RT PCR POSITIVE (*)    All other components within normal limits    EKG None  Radiology No results found.  Procedures Procedures  Maintained oxygen on ambulatory O2 assessment with no tachycardia and no signs of respiratory distress.  Medications Ordered in ED Medications  acetaminophen (TYLENOL) tablet 1,000 mg (1,000 mg Oral Given 03/25/23 1339)    ED Course/ Medical Decision Making/ A&P                                Medical Decision Making Amount and/or Complexity of Data Reviewed Labs: ordered. Decision-making details documented in ED Course. Radiology: ordered. Decision-making details documented in ED Course.  Risk OTC drugs.   Medical Decision Making:   Bradley [name] is a 67 y.o. male who presented to the ED today with cough detailed above.    Patient's presentation is complicated by their history of age, hypertension, heart failure.  Complete initial physical exam performed, notably the patient was in no acute distress.  Nontoxic-appearing.  Regular rate  and rhythm, lungs clear to auscultation, no respiratory distress.  No lower extremity edema.    Reviewed and confirmed nursing documentation for past medical history, family history, social history.    Initial Assessment:   With the patient's presentation, differential diagnosis for emergent cause of cough includes but is not limited to upper respiratory infection, lower respiratory infection, allergies, asthma, irritants, foreign body, medications such as ACE inhibitors, reflux,  asthma, CHF, lung cancer, interstitial lung disease, psychiatric causes, postnasal drip and postinfectious bronchospasm.  This is most consistent with an acute complicated illness  Initial Plan:  Viral swabs CXR to evaluate for structural/infectious intrathoracic pathology.  Objective evaluation as below reviewed   Initial Study Results:   Laboratory  All laboratory results reviewed without evidence of clinically relevant pathology.   Exceptions include: Positive COVID swab  Radiology:  All images reviewed independently. Agree with radiology report at this time.   DG Chest Port 1 View  Result Date: 03/25/2023 CLINICAL DATA:  Cough and congestion for 2 days.  COVID positive EXAM: PORTABLE CHEST 1 VIEW COMPARISON:  03/18/2023 FINDINGS: Sternal wires. Normal cardiopericardial silhouette. Slight elevation of the left hemidiaphragm. No consolidation, pneumothorax or effusion. No edema. Left upper chest defibrillator. Osteopenia. IMPRESSION: Postop chest with defibrillator. Slight elevation of the left hemidiaphragm. Electronically Signed   By: Karen Kays M.D.   On: 03/25/2023 14:30   CT ABDOMEN PELVIS W CONTRAST  Result Date: 03/24/2023 CLINICAL DATA:  Unintentional weight loss. EXAM: CT ABDOMEN AND PELVIS WITH CONTRAST TECHNIQUE: Multidetector CT imaging of the abdomen and pelvis was performed using the standard protocol following bolus administration of intravenous contrast. RADIATION DOSE REDUCTION: This exam was  performed according to the departmental dose-optimization program which includes automated exposure control, adjustment of the mA and/or kV according to patient size and/or use of iterative reconstruction technique. CONTRAST:  ISOVUE-300 IOPAMIDOL (ISOVUE-300) INJECTION 61% COMPARISON:  CT of the abdomen pelvis dated 11/23/2013. FINDINGS: Lower chest: The visualized lung bases are clear. A pacemaker lead is noted. No intra-abdominal free air or free fluid. Hepatobiliary: The liver is unremarkable. No biliary ductal dilatation. The gallbladder is unremarkable. Pancreas: Unremarkable. No pancreatic ductal dilatation or surrounding inflammatory changes. Spleen: Normal in size without focal abnormality. Adrenals/Urinary Tract: The adrenal glands are unremarkable. Mild bilateral renal parenchyma atrophy with cortical scarring. Small bilateral renal upper pole cysts. Additional subcentimeter hypodense lesions are too small to characterize. There is no hydronephrosis on either side. There is symmetric enhancement and excretion of contrast by both kidneys. The visualized ureters and urinary bladder appear unremarkable. Stomach/Bowel: Slight irregularity of the rectal wall with focal area of nodular thickening of the right lateral rectal wall (96/2 and coronal 52/5) may be related to underdistention and volume averaging. A mass is not excluded. Further evaluation with clinical exam and colonoscopy is recommended. There is sigmoid diverticulosis without active inflammatory changes. There is no bowel obstruction or active inflammation. The appendix is normal. Vascular/Lymphatic: Mild aortoiliac atherosclerotic disease. The IVC is unremarkable. No portal venous gas. There is no adenopathy. Reproductive: The prostate and seminal vesicles are grossly unremarkable. No pelvic mass. Other: Small right inguinal hernia with herniation of a short segment of distal small bowel. No obstruction. Musculoskeletal: Osteopenia with  degenerative changes. No acute osseous pathology. IMPRESSION: 1. No acute intra-abdominal or pelvic pathology. 2. Sigmoid diverticulosis. No bowel obstruction. Normal appendix. 3. Underdistention of the rectum versus a lesion arising from the right lateral rectal wall. Correlation with clinical exam and colonoscopy recommended. 4. Small right inguinal hernia with herniation of a short segment of distal small bowel. No obstruction. 5.  Aortic Atherosclerosis (ICD10-I70.0). Electronically Signed   By: Elgie Collard M.D.   On: 03/24/2023 23:35   CUP PACEART REMOTE DEVICE CHECK  Result Date: 03/19/2023 Scheduled remote reviewed. Normal device function.  1 NSVT detection lasting 12 beats.  HF diagnostics currently abnormal, followed by HF. Next remote 91 days. - CS,  CVRS  DG Chest 2 View  Result Date: 03/18/2023 CLINICAL DATA:  Facial swelling 2 days. EXAM: CHEST - 2 VIEW COMPARISON:  10/16/2020 FINDINGS: Sternotomy wires and left-sided pacemaker unchanged. Lungs are adequately inflated and otherwise clear. Cardiomediastinal silhouette and remainder of the exam is unchanged. IMPRESSION: No acute cardiopulmonary disease. Electronically Signed   By: Elberta Fortis M.D.   On: 03/18/2023 12:42      Final Assessment and Plan:   67 year old male presents to the ED with cough, congestion, and bodyaches since yesterday.  Overall well-appearing.  Afebrile.  No meningismus.  Regular rate and rhythm, lungs clear to auscultation, no respiratory distress.  Maintaining oxygen saturation on room air.  Overall vital signs reassuring.  COVID swab positive.  Chest x-ray without focal consolidation.  Patient does not appear volume overloaded.  Ambulatory O2 without decompensation.  Discussed findings with patient.  No indication for Paxlovid therapy at this time.  Patient given very strict ED return precautions and expressed understanding of this.  All questions answered and stable for discharge.   Clinical Impression:   1. COVID-19      Discharge           Final Clinical Impression(s) / ED Diagnoses Final diagnoses:  None    Rx / DC Orders ED Discharge Orders     None         Tonette Lederer, PA-C 03/25/23 1443    Ernie Avena, MD 03/25/23 1456

## 2023-03-26 ENCOUNTER — Telehealth: Payer: Self-pay

## 2023-03-26 NOTE — Telephone Encounter (Signed)
Transition Care Management Unsuccessful Follow-up Telephone Call  Date of discharge and from where:  [facility] 8/18  Attempts:  1st Attempt  Reason for unsuccessful TCM follow-up call:  No answer/busy   Lenard Forth [place]  [facility], [facility] Guide, Phone: [phone] Website: [email or web address]

## 2023-03-27 ENCOUNTER — Telehealth: Payer: Self-pay

## 2023-03-27 NOTE — Telephone Encounter (Signed)
Transition Care Management Unsuccessful Follow-up Telephone Call  Date of discharge and from where:  [facility] 8/18  Attempts:  2nd Attempt  Reason for unsuccessful TCM follow-up call:  No answer/busy   Lenard Forth [place]  [facility] [facility], [facility] Guide, Phone: [phone] Website: [email or web address]

## 2023-03-28 NOTE — Progress Notes (Signed)
Remote ICD transmission.   

## 2023-04-03 ENCOUNTER — Ambulatory Visit: Payer: 59 | Attending: Internal Medicine

## 2023-04-03 DIAGNOSIS — I5022 Chronic systolic (congestive) heart failure: Secondary | ICD-10-CM

## 2023-04-03 DIAGNOSIS — Z95810 Presence of automatic (implantable) cardiac defibrillator: Secondary | ICD-10-CM

## 2023-04-04 NOTE — Progress Notes (Signed)
EPIC Encounter for ICM Monitoring  Patient Name: Bradley [name] is a 67 y.o. male Date: 04/04/2023 Primary Care Physican: Kristian Covey, MD Primary Cardiologist: Bensimhon Electrophysiologist: Graciela Husbands Nephrologist:  Deterding 08/23/2022 Weight: 198.2 lbs 11/13/2022 Weight: 198 lbs                                              Transmission reviewed.    CorVue thoracic impedance suggesting fluid levels returned to normal.   Prescribed:  Furosemide 20 mg Take 1 tablet (20 mg total) by mouth 4 (four) times a week.   Per 06/28/2022 OV note from Dr Gala Romney take Furosemide 3 times week + PRN and wife confirmed on 08/23/22 he is taking 3 times week unless 4th dose needed for fluid symptoms.   Farxiga 10 mg take 1 tablet by mouth daily    Labs: 03/18/2023 Creatinine 1.09, BUN 13, Potassium 4.5, Sodium 139, GFR >60 11/17/2022 Creatinine 1.33, BUN 17, Potassium 4.6, Sodium 141 10/23/2022 Creatinine 1.20, BUN 16, Potassium 4.9, Sodium 141, GFR >60 A complete set of results can be found in Results Review.   Recommendations:  No changes   Follow-up plan: ICM clinic phone appointment on 04/23/2023.   91 day device clinic remote transmission 06/18/2023.     EP/Cardiology Office Visits:  04/16/2023 with HF clinic.  Recall 07/06/2023 with Otilio Saber, PA     Copy of ICM check sent to Dr. Graciela Husbands.   3 month ICM trend: 04/03/2023.    12-14 Month ICM trend:     Karie Soda, RN 04/04/2023 4:10 PM

## 2023-04-07 ENCOUNTER — Other Ambulatory Visit: Payer: Self-pay | Admitting: Family Medicine

## 2023-04-10 ENCOUNTER — Telehealth: Payer: Self-pay | Admitting: *Deleted

## 2023-04-10 NOTE — Telephone Encounter (Signed)
Transition Care Management Unsuccessful Follow-up Telephone Call  Date of discharge and from where:  [facility]  03/25/2023  Attempts:  1st Attempt  Reason for unsuccessful TCM follow-up call:  No answer/busy

## 2023-04-11 ENCOUNTER — Other Ambulatory Visit: Payer: Self-pay | Admitting: Internal Medicine

## 2023-04-11 ENCOUNTER — Telehealth: Payer: Self-pay | Admitting: *Deleted

## 2023-04-11 NOTE — Telephone Encounter (Signed)
Transition Care Management Unsuccessful Follow-up Telephone Call  Date of discharge and from where:  [facility]  03/25/2023  Attempts:2nd attempt   Reason for unsuccessful TCM follow-up call:  Left voice message

## 2023-04-13 ENCOUNTER — Encounter: Payer: Self-pay | Admitting: Family Medicine

## 2023-04-13 ENCOUNTER — Ambulatory Visit (INDEPENDENT_AMBULATORY_CARE_PROVIDER_SITE_OTHER): Payer: 59 | Admitting: Family Medicine

## 2023-04-13 VITALS — BP 126/80 | HR 73 | Temp 97.7°F | Ht 72.0 in | Wt 186.6 lb

## 2023-04-13 DIAGNOSIS — D126 Benign neoplasm of colon, unspecified: Secondary | ICD-10-CM

## 2023-04-13 DIAGNOSIS — K4090 Unilateral inguinal hernia, without obstruction or gangrene, not specified as recurrent: Secondary | ICD-10-CM | POA: Diagnosis not present

## 2023-04-13 DIAGNOSIS — R935 Abnormal findings on diagnostic imaging of other abdominal regions, including retroperitoneum: Secondary | ICD-10-CM | POA: Diagnosis not present

## 2023-04-13 NOTE — Progress Notes (Signed)
Established Patient Office Visit  Subjective   Patient ID: Bradley [name], male    DOB: May 05, 1956  Age: 67 y.o. MRN: [id number]  Chief Complaint  Patient presents with   Results    HPI   Mr. Bounds is here to follow-up regarding recent abnormal x-ray result.  We obtained CT abdomen pelvis to assess unintentional weight loss.  No acute intra-abdominal or pelvic pathology noted.  There was mention of sigmoid diverticulosis but no diverticulitis.  No bowel obstruction.  Comment of "under distention of the rectum versus a lesion arising from the right lateral rectal wall.  Correlation with clinical exam and colonoscopy recommended".  Also noted small right inguinal hernia.  We had referred to general surgeon for his hernia and has not heard back yet.  Overall feels stable.  No abdominal pain at this time.  Still has poor appetite.    He had colonoscopy 12/20 with serrated polyp and adenoma.  Recommended 3-year follow-up.  No recent stool changes.  No hematochezia.  Occasional constipation.  He has chronic systolic heart failure which has been stable.  No recent peripheral edema.  Weight stable.  He has hypothyroidism with recent TSH back in April normal.  EXAM: CT ABDOMEN AND PELVIS WITH CONTRAST   TECHNIQUE: Multidetector CT imaging of the abdomen and pelvis was performed using the standard protocol following bolus administration of intravenous contrast.   RADIATION DOSE REDUCTION: This exam was performed according to the departmental dose-optimization program which includes automated exposure control, adjustment of the mA and/or kV according to patient size and/or use of iterative reconstruction technique.   CONTRAST:  ISOVUE-300 IOPAMIDOL (ISOVUE-300) INJECTION 61%   COMPARISON:  CT of the abdomen pelvis dated 11/23/2013.   FINDINGS: Lower chest: The visualized lung bases are clear. A pacemaker lead is noted.   No intra-abdominal free air or free fluid.    Hepatobiliary: The liver is unremarkable. No biliary ductal dilatation. The gallbladder is unremarkable.   Pancreas: Unremarkable. No pancreatic ductal dilatation or surrounding inflammatory changes.   Spleen: Normal in size without focal abnormality.   Adrenals/Urinary Tract: The adrenal glands are unremarkable. Mild bilateral renal parenchyma atrophy with cortical scarring. Small bilateral renal upper pole cysts. Additional subcentimeter hypodense lesions are too small to characterize. There is no hydronephrosis on either side. There is symmetric enhancement and excretion of contrast by both kidneys. The visualized ureters and urinary bladder appear unremarkable.   Stomach/Bowel: Slight irregularity of the rectal wall with focal area of nodular thickening of the right lateral rectal wall (96/2 and coronal 52/5) may be related to underdistention and volume averaging. A mass is not excluded. Further evaluation with clinical exam and colonoscopy is recommended. There is sigmoid diverticulosis without active inflammatory changes. There is no bowel obstruction or active inflammation. The appendix is normal.   Vascular/Lymphatic: Mild aortoiliac atherosclerotic disease. The IVC is unremarkable. No portal venous gas. There is no adenopathy.   Reproductive: The prostate and seminal vesicles are grossly unremarkable. No pelvic mass.   Other: Small right inguinal hernia with herniation of a short segment of distal small bowel. No obstruction.   Musculoskeletal: Osteopenia with degenerative changes. No acute osseous pathology.   IMPRESSION: 1. No acute intra-abdominal or pelvic pathology. 2. Sigmoid diverticulosis. No bowel obstruction. Normal appendix. 3. Underdistention of the rectum versus a lesion arising from the right lateral rectal wall. Correlation with clinical exam and colonoscopy recommended. 4. Small right inguinal hernia with herniation of a short segment of distal  small bowel. No obstruction. 5.  Aortic Atherosclerosis (ICD10-I70.0).  Past Medical History:  Diagnosis Date   Anxiety    Aortic stenosis    s/p Bentall with bioprosthetic AVR 02/2010; Last echo (9/11): Moderate LVH, EF 45-50%, AVR functioning appropriately, aortic valve mean gradient 21, diastolic dysfunction. Chest MRA (2/13): Mild to moderate dilatation at the sinus of Valsalva at 4.1 cm, mild dilatation ascending aorta distal to the tube graft at 3.9 cm, moderate dilatation of the innominate artery a 2.1 cm;     CHF (congestive heart failure) (HCC)    Depression    ESRD on dialysis (HCC)    a. 09/2013 felt to be related to gentamycin b. no longer requiring HD   Hx of cardiac cath    a. LHC in 02/2010: normal cors   Hx of echocardiogram 02/2014   Echo (8/15):  Mod LV, EF 35-40%, Gr 1 DD, AVR ok (mean 14 mmHg), mild LAE   Hypertension    Hypothyroidism, postsurgical    Prosthetic valve endocarditis (HCC)    Staphylococcus aureus bacteremia    Thyroid cancer (HCC)    Ventricular tachycardia (HCC)    a. s/p STJ ICD   Past Surgical History:  Procedure Laterality Date   AORTIC VALVE REPAIR  1968   AORTIC VALVE REPLACEMENT  2011   AV FISTULA PLACEMENT Left 10/08/2013   Procedure: ARTERIOVENOUS (AV) FISTULA CREATION- LEFT ARM; Radial Cephalic ;  Surgeon: Pryor Ochoa, MD;  Location: [facility] OR;  Service: Vascular;  Laterality: Left;   BENTALL PROCEDURE N/A 10/28/2013   Procedure: REDO BENTALL PROCEDURE, debridment of aoritc root abscess, replacement of aortic root, ascending aorta and aortic valve with homograft. Insertion of left femoral arterial line;  Surgeon: Delight Ovens, MD;  Location: [facility] OR;  Service: Open Heart Surgery;  Laterality: N/A;   BIOPSY  07/22/2019   Procedure: BIOPSY;  Surgeon: Sherrilyn Rist, MD;  Location: [facility];  Service: Gastroenterology;;   CARDIAC CATHETERIZATION  03/02/2010   NORMAL CORONARY ARTERY   CARDIAC CATHETERIZATION N/A 01/01/2015   Procedure:  Coronary/Graft Angiography;  Surgeon: Lennette Bihari, MD;  Location: [facility] INVASIVE CV LAB;  Service: Cardiovascular;  Laterality: N/A;   CARDIAC VALVE REPLACEMENT     COLONOSCOPY WITH PROPOFOL N/A 07/22/2019   Procedure: COLONOSCOPY WITH PROPOFOL;  Surgeon: Sherrilyn Rist, MD;  Location: [facility] ENDOSCOPY;  Service: Gastroenterology;  Laterality: N/A;   EP IMPLANTABLE DEVICE N/A 01/01/2015   Procedure: ICD Implant;  Surgeon: Duke Salvia, MD;  Location: [facility] INVASIVE CV LAB;  Service: Cardiovascular;  Laterality: N/A;   HEMOSTASIS CLIP PLACEMENT  07/22/2019   Procedure: HEMOSTASIS CLIP PLACEMENT;  Surgeon: Sherrilyn Rist, MD;  Location: [facility] ENDOSCOPY;  Service: Gastroenterology;;   INTRAOPERATIVE TRANSESOPHAGEAL ECHOCARDIOGRAM N/A 10/28/2013   Procedure: INTRAOPERATIVE TRANSESOPHAGEAL ECHOCARDIOGRAM;  Surgeon: Delight Ovens, MD;  Location: [facility] OR;  Service: Open Heart Surgery;  Laterality: N/A;   PERIPHERAL VASCULAR CATHETERIZATION N/A 01/01/2015   Procedure: Aortic Arch Angiography;  Surgeon: Lennette Bihari, MD;  Location: [facility] INVASIVE CV LAB;  Service: Cardiovascular;  Laterality: N/A;   POLYPECTOMY  07/22/2019   Procedure: POLYPECTOMY;  Surgeon: Sherrilyn Rist, MD;  Location: [facility] ENDOS[facility];  Service: Gastroenterology;;   SHOULDER ARTHROSCOPY W/ ROTATOR CUFF REPAIR Right 2012   STERNOTOMY     REDO   TEE WITHOUT CARDIOVERSION N/A 10/24/2013   Procedure: TRANSESOPHAGEAL ECHOCARDIOGRAM (TEE);  Surgeon: Lars Masson, MD;  Location: [facility] ENDOSCOPY;  Service: Cardiovascular;  Laterality: N/A;   THYROIDECTOMY  ~ 2005   TRANSTHORACIC ECHOCARDIOGRAM  03/2010   SHOWED MILD REDUCTION OF LV FUNCTION    reports that he has never smoked. He has never used smokeless tobacco. He reports that he does not drink alcohol and does not use drugs. family history includes Heart disease in his father; Hypertension in his father and mother; Kidney Stones in his sister; Stroke in his father; Thyroid cancer in his  sister and sister. Allergies  Allergen Reactions   Oxycodone Other (See Comments)    Gives patient nightmares   Iodinated Contrast Media Rash   Rifampin Nausea Only    Review of Systems  Constitutional:  Positive for weight loss. Negative for chills, fever and malaise/fatigue.  Eyes:  Negative for blurred vision.  Cardiovascular:  Negative for chest pain.  Gastrointestinal:  Negative for abdominal pain, blood in stool, diarrhea and melena.  Neurological:  Negative for dizziness, weakness and headaches.      Objective:     BP 126/80 (BP Location: Left Arm, Patient Position: Sitting, Cuff Size: Normal)   Pulse 73   Temp 97.7 F (36.5 C) (Oral)   Ht 6' (1.829 m)   Wt 186 lb 9.6 oz (84.6 kg)   SpO2 98%   BMI 25.31 kg/m  BP Readings from Last 3 Encounters:  04/13/23 126/80  03/25/23 105/89  03/18/23 107/79   Wt Readings from Last 3 Encounters:  04/13/23 186 lb 9.6 oz (84.6 kg)  03/12/23 190 lb 14.4 oz (86.6 kg)  11/17/22 197 lb 12.8 oz (89.7 kg)      Physical Exam Vitals reviewed.  Constitutional:      Appearance: Normal appearance.  Cardiovascular:     Rate and Rhythm: Normal rate and regular rhythm.  Pulmonary:     Effort: Pulmonary effort is normal.     Breath sounds: Normal breath sounds.  Genitourinary:    Comments: Rectal exam reveals moderately enlarged prostate which is nontender.  He has some hard stool in rectal vault.  No rectal mass palpated. Neurological:     Mental Status: He is alert.      No results found for any visits on 04/13/23.  Last CBC Lab Results  Component Value Date   WBC 6.5 03/18/2023   HGB 17.7 (H) 03/18/2023   HCT 50.1 03/18/2023   MCV 89.9 03/18/2023   MCH 31.8 03/18/2023   RDW 13.7 03/18/2023   PLT 166 03/18/2023   Last metabolic panel Lab Results  Component Value Date   GLUCOSE 125 (H) 03/18/2023   NA 139 03/18/2023   K 4.5 03/18/2023   CL 110 03/18/2023   CO2 20 (L) 03/18/2023   BUN 13 03/18/2023    CREATININE 1.09 03/18/2023   GFRNONAA >60 03/18/2023   CALCIUM 9.4 03/18/2023   PHOS 4.8 (H) 11/28/2013   PROT 7.3 11/17/2022   ALBUMIN 4.3 11/17/2022   BILITOT 0.4 11/17/2022   ALKPHOS 59 11/17/2022   AST 32 11/17/2022   ALT 33 11/17/2022   ANIONGAP 9 03/18/2023   Last lipids Lab Results  Component Value Date   CHOL 137 04/12/2022   HDL 46.00 04/12/2022   LDLCALC 72 04/12/2022   TRIG 97.0 04/12/2022   CHOLHDL 3 04/12/2022   Last hemoglobin A1c Lab Results  Component Value Date   HGBA1C 5.9 (H) 12/30/2014   Last thyroid functions Lab Results  Component Value Date   TSH 4.11 11/17/2022   T3TOTAL 29.1 (L) 09/14/2013      The ASCVD  Risk score (Arnett DK, et al., 2019) failed to calculate for the following reasons:   The patient has a prior MI or stroke diagnosis    Assessment & Plan:   #1 recent abnormal CT abdomen pelvis.  Question of right lateral rectal wall abnormality.  No abnormality noted today on exam with digital.  His last colonoscopy was 12/20.  Does have history of adenomatous polyps as below and needs repeat colonoscopy.  Setting up GI referral  #2 history of adenomatous colon polyps.  Overdue for repeat colonoscopy.  GI referral placed  #3 right inguinal hernia.  Referral was made to general surgeon has not heard back yet.  Will reassess referral status    No follow-ups on file.    Evelena Peat, MD

## 2023-04-13 NOTE — Progress Notes (Signed)
Heart Failure Clinic Note  Date:  04/13/2023   ID:  Bradley [name], DOB 1956/01/22, MRN [id number]  Location: Home  Provider location: [place] [facility] Type of Visit: Established patient  PCP:  Kristian Covey, MD  Cardiologist:  Dietrich Pates, MD Primary HF: Bensimhon  Chief Complaint: Heart Failure follow-up   History of Present Illness:  Bradley [name] is a 67 y.o. y/o male with a complicated past medical history including thyroid cancer (s/p thyroidectomy), HTN, depression, and congenital AS s/p aortic vavulotomy at age 9 with subsequent Bentall procedure in 2001 with a pericardial tissue valve.  He had MSSA bacteremia and bacterial endocarditis in 08/2013 complicated by renal failure requiring dialysis for a short period of time.  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     Admitted 11/2014 with monomorphic VT. Cath with normal coronaries.  Echo EF 30-35% -> STJ dual chamber ICD implant  Echo 9/21 EF 35-40% Echo 11/23 EF 25-30%. RV mildly down. AVR [name]  Today he returns for HF follow up with his wife. Doing well. Denies CP or SOB. Not very active. Depression up and down. No edema, orthopnea or PND. Compliant with meds. Overall feels like he is doing [name]. \  Corvue: Unable to transmit   Studies: Echo Ef 35-40%  Echo 07/2019 30-35%  Echo 7/19  30-35% AVR stable. Personally reviewed Echo 5/18 EF 25-30%    CPX 01/01/17  FEV1 3.40 (86%)        FEV1/FVC 83 (107%)        MVV 163 (108%) Resting HR: 72 Peak HR: 129   (80% age predicted max HR) BP rest: 118/70 BP peak: 174/84  Peak VO2: 21.1 (73% predicted peak VO2) VE/VCO2 slope:  35 OUES: 1.96 Peak RER:  1.22 Ventilatory Threshold: 16.6 (58% predicted or measured peak VO2) VE/MVV:  50% O2pulse:  15   (100% predicted O2pulse)     Past Medical History:   Diagnosis Date   Anxiety    Aortic stenosis    s/p Bentall with bioprosthetic AVR 02/2010; Last echo (9/11): Moderate LVH, EF 45-50%, AVR functioning appropriately, aortic valve mean gradient 21, diastolic dysfunction. Chest MRA (2/13): Mild to moderate dilatation at the sinus of Valsalva at 4.1 cm, mild dilatation ascending aorta distal to the tube graft at 3.9 cm, moderate dilatation of the innominate artery a 2.1 cm;     CHF (congestive heart failure) (HCC)    Depression    ESRD on dialysis (HCC)    a. 09/2013 felt to be related to gentamycin b. no longer requiring HD   Hx of cardiac cath    a. LHC in 02/2010: normal cors   Hx of echocardiogram 02/2014   Echo (8/15):  Mod LV, EF 35-40%, Gr 1 DD, AVR [name] (mean 14 mmHg), mild LAE   Hypertension    Hypothyroidism, postsurgical    Prosthetic valve endocarditis (HCC)    Staphylococcus aureus bacteremia    Thyroid cancer (HCC)    Ventricular tachycardia (HCC)    a. s/p STJ ICD   Past Surgical History:  Procedure Laterality Date   AORTIC VALVE REPAIR  1968   AORTIC VALVE REPLACEMENT  2011   AV FISTULA PLACEMENT Left 10/08/2013   Procedure: ARTERIOVENOUS (AV) FISTULA CREATION- LEFT ARM; Radial Cephalic ;  Surgeon: Pryor Ochoa, MD;  Location: [facility] OR;  Service:  Vascular;  Laterality: Left;   BENTALL PROCEDURE N/A 10/28/2013   Procedure: REDO BENTALL PROCEDURE, debridment of aoritc root abscess, replacement of aortic root, ascending aorta and aortic valve with homograft. Insertion of left femoral arterial line;  Surgeon: Delight Ovens, MD;  Location: [facility] OR;  Service: Open Heart Surgery;  Laterality: N/A;   BIOPSY  07/22/2019   Procedure: BIOPSY;  Surgeon: Sherrilyn Rist, MD;  Location: [facility];  Service: Gastroenterology;;   CARDIAC CATHETERIZATION  03/02/2010   NORMAL CORONARY ARTERY   CARDIAC CATHETERIZATION N/A 01/01/2015   Procedure: Coronary/Graft Angiography;  Surgeon: Lennette Bihari, MD;  Location: [facility] INVASIVE CV LAB;  Service:  Cardiovascular;  Laterality: N/A;   CARDIAC VALVE REPLACEMENT     COLONOSCOPY WITH PROPOFOL N/A 07/22/2019   Procedure: COLONOSCOPY WITH PROPOFOL;  Surgeon: Sherrilyn Rist, MD;  Location: [facility] ENDOSCOPY;  Service: Gastroenterology;  Laterality: N/A;   EP IMPLANTABLE DEVICE N/A 01/01/2015   Procedure: ICD Implant;  Surgeon: Duke Salvia, MD;  Location: [facility] INVASIVE CV LAB;  Service: Cardiovascular;  Laterality: N/A;   HEMOSTASIS CLIP PLACEMENT  07/22/2019   Procedure: HEMOSTASIS CLIP PLACEMENT;  Surgeon: Sherrilyn Rist, MD;  Location: [facility] ENDOSCOPY;  Service: Gastroenterology;;   INTRAOPERATIVE TRANSESOPHAGEAL ECHOCARDIOGRAM N/A 10/28/2013   Procedure: INTRAOPERATIVE TRANSESOPHAGEAL ECHOCARDIOGRAM;  Surgeon: Delight Ovens, MD;  Location: [facility] OR;  Service: Open Heart Surgery;  Laterality: N/A;   PERIPHERAL VASCULAR CATHETERIZATION N/A 01/01/2015   Procedure: Aortic Arch Angiography;  Surgeon: Lennette Bihari, MD;  Location: [facility] INVASIVE CV LAB;  Service: Cardiovascular;  Laterality: N/A;   POLYPECTOMY  07/22/2019   Procedure: POLYPECTOMY;  Surgeon: Sherrilyn Rist, MD;  Location: [facility] ENDOSCOPY;  Service: Gastroenterology;;   SHOULDER ARTHROSCOPY W/ ROTATOR CUFF REPAIR Right 2012   STERNOTOMY     REDO   TEE WITHOUT CARDIOVERSION N/A 10/24/2013   Procedure: TRANSESOPHAGEAL ECHOCARDIOGRAM (TEE);  Surgeon: Lars Masson, MD;  Location: [facility] ENDOSCOPY;  Service: Cardiovascular;  Laterality: N/A;   THYROIDECTOMY  ~ 2005   TRANSTHORACIC ECHOCARDIOGRAM  03/2010   SHOWED MILD REDUCTION OF LV FUNCTION     Current Outpatient Medications  Medication Sig Dispense Refill   acetaminophen (TYLENOL) 325 MG tablet Take 325 mg by mouth 2 (two) times daily as needed for moderate pain or headache.     ARIPiprazole (ABILIFY) 5 MG tablet Take 5 mg by mouth daily.     aspirin EC 81 MG tablet Take 81 mg by mouth at bedtime.      atorvastatin (LIPITOR) 40 MG tablet TAKE 1 TABLET BY MOUTH EVERY DAY 90 tablet 3    carvedilol (COREG) 6.25 MG tablet TAKE 1 TABLET BY MOUTH TWICE A DAY WITH FOOD 180 tablet 3   clonazePAM (KLONOPIN) 1 MG tablet TAKE 1 TABLET BY MOUTH TWICE A DAY *NOT FOR REGULAR USE* 30 tablet 0   docusate sodium (COLACE) 100 MG capsule Take 100 mg by mouth daily as needed.     doxazosin (CARDURA) 1 MG tablet Take 1 tablet (1 mg total) by mouth daily. For SBP greater than 140 30 tablet 11   Ensure (ENSURE) Take 237 mLs by mouth daily as needed. 237 mL 12   ENTRESTO 97-103 MG TAKE 1 TABLET BY MOUTH TWICE A DAY 180 tablet 1   FARXIGA 10 MG TABS tablet TAKE 1 TABLET BY MOUTH EVERY DAY 30 tablet 11   furosemide (LASIX) 20 MG tablet TAKE 1 TABLET BY MOUTH 4 TIMES A  WEEK. (Patient taking differently: PRN) 48 tablet 3   levothyroxine (SYNTHROID) 150 MCG tablet TAKE 1 TABLET BY MOUTH EVERY DAY 90 tablet 0   mexiletine (MEXITIL) 150 MG capsule TAKE 2 CAPSULES BY MOUTH EVERY MORNING AND 2 CAPSULES BY MOUTH EVERY EVENING. 360 capsule 3   omeprazole (PRILOSEC) 20 MG capsule TAKE 1 CAPSULE BY MOUTH EVERY DAY 90 capsule 1   ranolazine (RANEXA) 500 MG 12 hr tablet TAKE 1 TABLET BY MOUTH TWICE A DAY 180 tablet 3   Salicylic Acid (WART REMOVER EX) Apply 1 application  topically daily as needed (foot corns).     sertraline (ZOLOFT) 50 MG tablet TAKE 1 TABLET BY MOUTH EVERY DAY 90 tablet 2   No current facility-administered medications for this visit.    Allergies:   Oxycodone, Iodinated contrast media, and Rifampin   Social History:  The patient  reports that he has never smoked. He has never used smokeless tobacco. He reports that he does not drink alcohol and does not use drugs.   Family History:  The patient's family history includes Heart disease in his father; Hypertension in his father and mother; Kidney Stones in his sister; Stroke in his father; Thyroid cancer in his sister and sister.   ROS:  Please see the history of present illness.   All other systems are personally reviewed and negative.    There were no vitals filed for this visit.   Wt Readings from Last 3 Encounters:  04/13/23 84.6 kg (186 lb 9.6 oz)  03/12/23 86.6 kg (190 lb 14.4 oz)  11/17/22 89.7 kg (197 lb 12.8 oz)    Exam:  General:  Well appearing. No resp difficulty HEENT: normal Neck: supple. no JVD. Carotids 2+ bilat; no bruits. No lymphadenopathy or thryomegaly appreciated. Cor: PMI nondisplaced. Regular rate & rhythm. No rubs, gallops or murmurs. Lungs: clear Abdomen: soft, nontender, nondistended. No hepatosplenomegaly. No bruits or masses. Good bowel sounds. Extremities: no cyanosis, clubbing, rash, edema Neuro: alert & orientedx3, cranial nerves grossly intact. moves all 4 extremities w/o difficulty. Affect pleasant   Recent Labs: 11/17/2022: ALT 33; Pro B Natriuretic peptide (BNP) 260.0; TSH 4.11 03/18/2023: B Natriuretic Peptide 194.9; BUN 13; Creatinine, Ser 1.09; Hemoglobin 17.7; Platelets 166; Potassium 4.5; Sodium 139  Personally reviewed   Wt Readings from Last 3 Encounters:  04/13/23 84.6 kg (186 lb 9.6 oz)  03/12/23 86.6 kg (190 lb 14.4 oz)  11/17/22 89.7 kg (197 lb 12.8 oz)      ASSESSMENT AND PLAN:  1. Chronic systolic HF  - NICM St Jude  Echo 5/17 LVEF 25-30%, Mild MR, Mod LAE, RV mildly dilated, Mild RAE. ECHO 12/21/2016 LVEF 25-30%. IW AK RV mild-mod  - Echo 02/20/18 shows 30-35% AVR stable.  - Echo 2020 EF 25% Personally reviewed - March 2017 CPX with moderate HF limitation.  - CPX 6/18 much improved. Peak VO2: 21.1 (73% predicted peak VO2) VE/VCO2 slope: 35 - Echo 9/21 EF 35-40% - Echo EF 2022 35-40% RV [name]  - Echo 06/28/22 EF 25-30% AVR stable - Stable NYHA II-III  Volume status looks good. Taking torsemide 20 3x/week + PRN - Continue Entresto 97/103 mg BID.  - Continue coreg 6.25 mg BID. Unable to titrate due to low HR - Continue Farxiga 10 mg daily. - Not on spiro due to K persistently > 5  2. VT/NICM - Quiescent on mexilitene/Ranexa - ICD - unable to transmit  3.  HTN - Blood pressure spike recently in setting of fluid being  up. Fluid now improved. If persists - let me know if DBP consistently > 90   4. Valvular heart disease - s/p bentall in 2001 and re-do bentall 2015.  - AVR stable on echo 11/23. Trivial AI - Reminded about need for SBE  5. Depression - Stable.    Bradley Ok, FNP  04/13/2023 2:49 PM  [facility] [address] Heart [facility] Vascular [place] [address] [phone] (office) [phone] (fax)'

## 2023-04-13 NOTE — Patient Instructions (Signed)
I have placed GI referral for repeat colonoscopy.

## 2023-04-16 ENCOUNTER — Ambulatory Visit (HOSPITAL_COMMUNITY)
Admission: RE | Admit: 2023-04-16 | Discharge: 2023-04-16 | Disposition: A | Discharge status: Home / Self Care | Payer: 59 | Source: Ambulatory Visit | Attending: Family Medicine | Admitting: Family Medicine

## 2023-04-16 ENCOUNTER — Encounter (HOSPITAL_COMMUNITY): Payer: Self-pay

## 2023-04-16 VITALS — BP 120/84 | HR 56 | Wt 188.0 lb

## 2023-04-16 DIAGNOSIS — Z95810 Presence of automatic (implantable) cardiac defibrillator: Secondary | ICD-10-CM | POA: Diagnosis not present

## 2023-04-16 DIAGNOSIS — I5022 Chronic systolic (congestive) heart failure: Secondary | ICD-10-CM | POA: Diagnosis not present

## 2023-04-16 DIAGNOSIS — I10 Essential (primary) hypertension: Secondary | ICD-10-CM

## 2023-04-16 DIAGNOSIS — I359 Nonrheumatic aortic valve disorder, unspecified: Secondary | ICD-10-CM

## 2023-04-16 DIAGNOSIS — Z4502 Encounter for adjustment and management of automatic implantable cardiac defibrillator: Secondary | ICD-10-CM | POA: Diagnosis not present

## 2023-04-16 DIAGNOSIS — I428 Other cardiomyopathies: Secondary | ICD-10-CM | POA: Insufficient documentation

## 2023-04-16 DIAGNOSIS — Z8616 Personal history of COVID-19: Secondary | ICD-10-CM | POA: Insufficient documentation

## 2023-04-16 DIAGNOSIS — Z953 Presence of xenogenic heart valve: Secondary | ICD-10-CM | POA: Insufficient documentation

## 2023-04-16 DIAGNOSIS — E890 Postprocedural hypothyroidism: Secondary | ICD-10-CM | POA: Diagnosis not present

## 2023-04-16 DIAGNOSIS — Z7984 Long term (current) use of oral hypoglycemic drugs: Secondary | ICD-10-CM | POA: Diagnosis not present

## 2023-04-16 DIAGNOSIS — Z79899 Other long term (current) drug therapy: Secondary | ICD-10-CM | POA: Diagnosis not present

## 2023-04-16 DIAGNOSIS — I110 Hypertensive heart disease with heart failure: Secondary | ICD-10-CM | POA: Insufficient documentation

## 2023-04-16 DIAGNOSIS — I472 Ventricular tachycardia, unspecified: Secondary | ICD-10-CM

## 2023-04-16 DIAGNOSIS — F32A Depression, unspecified: Secondary | ICD-10-CM

## 2023-04-16 DIAGNOSIS — Z79890 Hormone replacement therapy (postmenopausal): Secondary | ICD-10-CM | POA: Diagnosis not present

## 2023-04-16 DIAGNOSIS — R110 Nausea: Secondary | ICD-10-CM | POA: Diagnosis not present

## 2023-04-16 DIAGNOSIS — I38 Endocarditis, valve unspecified: Secondary | ICD-10-CM | POA: Insufficient documentation

## 2023-04-16 DIAGNOSIS — Z9089 Acquired absence of other organs: Secondary | ICD-10-CM | POA: Insufficient documentation

## 2023-04-16 DIAGNOSIS — Z85850 Personal history of malignant neoplasm of thyroid: Secondary | ICD-10-CM | POA: Diagnosis not present

## 2023-04-16 NOTE — Patient Instructions (Signed)
No change in medications. We have ordered a CPX for you; after insurance authorization is obtained, we will call and schedule. Return in 3 months to see Dr. Gala Romney with echo. Please call us at [phone] if any questions or concerns prior to your next visit.

## 2023-04-23 ENCOUNTER — Telehealth (HOSPITAL_COMMUNITY): Payer: Self-pay | Admitting: *Deleted

## 2023-04-23 ENCOUNTER — Ambulatory Visit: Payer: 59 | Attending: Internal Medicine

## 2023-04-23 DIAGNOSIS — Z95810 Presence of automatic (implantable) cardiac defibrillator: Secondary | ICD-10-CM | POA: Diagnosis not present

## 2023-04-23 DIAGNOSIS — I5022 Chronic systolic (congestive) heart failure: Secondary | ICD-10-CM

## 2023-04-23 NOTE — Telephone Encounter (Signed)
Left voicemail to remind patient of CPX appointment on Monday 9/30 at 1:30pm. Provided patient with call back [phone] if cancellation/reschedule is necessary.       Reggy Eye, MS, ACSM, [facility] Clinical Exercise Physiologist/ Health and Wellness Coach

## 2023-04-24 ENCOUNTER — Telehealth: Payer: Self-pay

## 2023-04-24 NOTE — Progress Notes (Signed)
EPIC Encounter for ICM Monitoring  Patient Name: Bradley [name] is a 67 y.o. male Date: 04/24/2023 Primary Care Physican: Kristian Covey, MD Primary Cardiologist: Bensimhon Electrophysiologist: Graciela Husbands Nephrologist:  Deterding 08/23/2022 Weight: 198.2 lbs 11/13/2022 Weight: 198 lbs 04/16/2023 Office Weigh: 188 lbs                                              Attempted call to patient and unable to reach.  Left detailed message per DPR regarding transmission. Transmission reviewed.    CorVue thoracic impedance suggesting normal fluid levels with the exception of possible fluid accumulation from 9/8-9/10 and 9/16-9/20.   Prescribed:  Furosemide 20 mg Take 1 tablet (20 mg total) by mouth 4 (four) times a week.   Per 04/16/2023 OV note from Prince Rome, NP at HF clinic he can take Furosemide 3 times week + PRN    Farxiga 10 mg take 1 tablet by mouth daily    Labs: 03/18/2023 Creatinine 1.09, BUN 13, Potassium 4.5, Sodium 139, GFR >60 11/17/2022 Creatinine 1.33, BUN 17, Potassium 4.6, Sodium 141 10/23/2022 Creatinine 1.20, BUN 16, Potassium 4.9, Sodium 141, GFR >60 A complete set of results can be found in Results Review.   Recommendations:  Left voice mail with ICM number and encouraged to call if experiencing any fluid symptoms.   Follow-up plan: ICM clinic phone appointment on 05/28/2023.   91 day device clinic remote transmission 06/18/2023.     EP/Cardiology Office Visits:  06/26/2023 with Dr Gala Romney.  12/10/024 Dr Graciela Husbands.     Copy of ICM check sent to Dr. Graciela Husbands.   3 month ICM trend: 04/23/2023.    12-14 Month ICM trend:     Karie Soda, RN 04/24/2023 7:28 AM

## 2023-04-24 NOTE — Telephone Encounter (Signed)
Remote ICM transmission received.  Attempted call to patient regarding ICM remote transmission and left detailed message per DPR.  Left ICM phone number and advised to return call for any fluid symptoms or questions.

## 2023-04-30 ENCOUNTER — Other Ambulatory Visit (HOSPITAL_COMMUNITY): Payer: Self-pay | Admitting: *Deleted

## 2023-04-30 ENCOUNTER — Ambulatory Visit (HOSPITAL_COMMUNITY): Payer: 59 | Attending: Internal Medicine

## 2023-04-30 DIAGNOSIS — I5022 Chronic systolic (congestive) heart failure: Secondary | ICD-10-CM | POA: Insufficient documentation

## 2023-04-30 DIAGNOSIS — I4510 Unspecified right bundle-branch block: Secondary | ICD-10-CM | POA: Diagnosis not present

## 2023-04-30 DIAGNOSIS — I4589 Other specified conduction disorders: Secondary | ICD-10-CM | POA: Diagnosis not present

## 2023-04-30 DIAGNOSIS — J984 Other disorders of lung: Secondary | ICD-10-CM | POA: Insufficient documentation

## 2023-05-06 ENCOUNTER — Other Ambulatory Visit: Payer: Self-pay | Admitting: Family Medicine

## 2023-05-15 ENCOUNTER — Encounter: Payer: Self-pay | Admitting: Cardiology

## 2023-05-15 ENCOUNTER — Encounter (HOSPITAL_COMMUNITY): Payer: Self-pay

## 2023-05-28 ENCOUNTER — Ambulatory Visit: Payer: 59 | Attending: Internal Medicine

## 2023-05-28 DIAGNOSIS — I5022 Chronic systolic (congestive) heart failure: Secondary | ICD-10-CM | POA: Diagnosis not present

## 2023-05-28 DIAGNOSIS — Z95810 Presence of automatic (implantable) cardiac defibrillator: Secondary | ICD-10-CM | POA: Diagnosis not present

## 2023-06-01 NOTE — Progress Notes (Signed)
EPIC Encounter for ICM Monitoring  Patient Name: Bradley [name] is a 67 y.o. male Date: 06/01/2023 Primary Care Physican: Kristian Covey, MD Primary Cardiologist: Bensimhon Electrophysiologist: Graciela Husbands Nephrologist:  Deterding 08/23/2022 Weight: 198.2 lbs 11/13/2022 Weight: 198 lbs 04/16/2023 Office Weigh: 188 lbs  Battery Longevity: 9.7 months                                              Transmission reviewed.    CorVue thoracic impedance suggesting normal fluid levels since 9/27.   Prescribed:  Furosemide 20 mg Take 1 tablet (20 mg total) by mouth 4 (four) times a week.   Per 04/16/2023 OV note from Prince Rome, NP at HF clinic he can take Furosemide 3 times week + PRN    Farxiga 10 mg take 1 tablet by mouth daily    Labs: 03/18/2023 Creatinine 1.09, BUN 13, Potassium 4.5, Sodium 139, GFR >60 11/17/2022 Creatinine 1.33, BUN 17, Potassium 4.6, Sodium 141 10/23/2022 Creatinine 1.20, BUN 16, Potassium 4.9, Sodium 141, GFR >60 A complete set of results can be found in Results Review.   Recommendations:  No changes.     Follow-up plan: ICM clinic phone appointment on 07/02/2023.   91 day device clinic remote transmission 06/18/2023.     EP/Cardiology Office Visits:  06/26/2023 with Dr Gala Romney.  12/10/024 Dr Graciela Husbands.     Copy of ICM check sent to Dr. Graciela Husbands.   3 month ICM trend: 05/28/2023.    12-14 Month ICM trend:     Karie Soda, RN 06/01/2023 7:39 AM

## 2023-06-15 ENCOUNTER — Other Ambulatory Visit: Payer: Self-pay | Admitting: Family Medicine

## 2023-06-18 ENCOUNTER — Ambulatory Visit (INDEPENDENT_AMBULATORY_CARE_PROVIDER_SITE_OTHER): Payer: 59

## 2023-06-18 DIAGNOSIS — I5022 Chronic systolic (congestive) heart failure: Secondary | ICD-10-CM | POA: Diagnosis not present

## 2023-06-18 DIAGNOSIS — I428 Other cardiomyopathies: Secondary | ICD-10-CM

## 2023-06-19 LAB — CUP PACEART REMOTE DEVICE CHECK
Battery Remaining Longevity: 8 mo
Battery Remaining Percentage: 8 %
Battery Voltage: 2.65 V
Brady Statistic RV Percent Paced: 1 %
Date Time Interrogation Session: 20241118020016
HighPow Impedance: 71 Ohm
HighPow Impedance: 71 Ohm
Implantable Lead Connection Status: 753985
Implantable Lead Implant Date: 20160603
Implantable Lead Location: 753860
Implantable Lead Model: 7122
Implantable Pulse Generator Implant Date: 20160603
Lead Channel Impedance Value: 450 Ohm
Lead Channel Pacing Threshold Amplitude: 1.5 V
Lead Channel Pacing Threshold Pulse Width: 0.5 ms
Lead Channel Sensing Intrinsic Amplitude: 9.5 mV
Lead Channel Setting Pacing Amplitude: 2.5 V
Lead Channel Setting Pacing Pulse Width: 0.5 ms
Lead Channel Setting Sensing Sensitivity: 0.5 mV
Pulse Gen Serial Number: 7135169

## 2023-06-21 ENCOUNTER — Telehealth: Payer: Self-pay | Admitting: *Deleted

## 2023-06-21 DIAGNOSIS — K4090 Unilateral inguinal hernia, without obstruction or gangrene, not specified as recurrent: Secondary | ICD-10-CM | POA: Diagnosis not present

## 2023-06-21 NOTE — Telephone Encounter (Signed)
   Pre-operative Risk Assessment    Patient Name: Bradley [name]  DOB: September 21, 1955 MRN: [id number]   Last OV: Dr. Graciela Husbands 07/22/2022 Upcoming OV: Dr. Graciela Husbands 07/10/2023, pre op added to appt notes.  Request for Surgical Clearance    Procedure:   Inguinal hernia surgery  Date of Surgery:  Clearance TBD                                 Surgeon:  Dr. Axel Filler Surgeon's Group or Practice Name:  [facility] Phone number:  [phone] Fax number:  [phone]   Type of Clearance Requested:   - Medical  - Pharmacy:  Hold Aspirin Not Indicated.   Type of Anesthesia:  General    Additional requests/questions:    Signed, Emmit Pomfret   06/21/2023, 2:49 PM

## 2023-06-25 NOTE — Telephone Encounter (Signed)
I will update all parties involved pt has appt tomorrow 06/26/23 with Dr. Gala Romney. I have added need pre op clearance to appt notes.

## 2023-06-25 NOTE — Telephone Encounter (Signed)
   Name: Bradley [name]  DOB: [birth year]  MRN: [id number]  Primary Cardiologist: Dietrich Pates, MD  Chart reviewed as part of pre-operative protocol coverage. Because of Bradley [name]'s past medical history and time since last visit, he will require a follow-up in-office visit in order to better assess preoperative cardiovascular risk.  Pre-op covering staff: - Please schedule appointment and call patient to inform them. If patient already had an upcoming appointment within acceptable timeframe, please add "pre-op clearance" to the appointment notes so provider is aware. - Please contact requesting surgeon's office via preferred method (i.e, phone, fax) to inform them of need for appointment prior to surgery.  If asymptomatic at the time of office visit, can hold aspirin 5 to 7 days prior to surgery and resume when medically safe to do so.  Sharlene Dory, PA-C  06/25/2023, 11:29 AM

## 2023-06-26 ENCOUNTER — Encounter (HOSPITAL_COMMUNITY): Payer: Self-pay | Admitting: Internal Medicine

## 2023-06-26 ENCOUNTER — Ambulatory Visit (HOSPITAL_BASED_OUTPATIENT_CLINIC_OR_DEPARTMENT_OTHER)
Admission: RE | Admit: 2023-06-26 | Discharge: 2023-06-26 | Disposition: A | Discharge status: Home / Self Care | Payer: 59 | Source: Ambulatory Visit | Attending: Internal Medicine | Admitting: Internal Medicine

## 2023-06-26 ENCOUNTER — Ambulatory Visit (HOSPITAL_COMMUNITY)
Admission: RE | Admit: 2023-06-26 | Discharge: 2023-06-26 | Disposition: A | Discharge status: Home / Self Care | Payer: 59 | Source: Ambulatory Visit | Attending: Family Medicine | Admitting: Family Medicine

## 2023-06-26 VITALS — BP 122/80 | HR 55 | Wt 193.4 lb

## 2023-06-26 DIAGNOSIS — I5022 Chronic systolic (congestive) heart failure: Secondary | ICD-10-CM

## 2023-06-26 DIAGNOSIS — I359 Nonrheumatic aortic valve disorder, unspecified: Secondary | ICD-10-CM | POA: Diagnosis not present

## 2023-06-26 DIAGNOSIS — Z95810 Presence of automatic (implantable) cardiac defibrillator: Secondary | ICD-10-CM | POA: Diagnosis not present

## 2023-06-26 DIAGNOSIS — I472 Ventricular tachycardia, unspecified: Secondary | ICD-10-CM | POA: Diagnosis not present

## 2023-06-26 LAB — CBC
HCT: 46.6 % (ref 39.0–52.0)
Hemoglobin: 15.1 g/dL (ref 13.0–17.0)
MCH: 30.8 pg (ref 26.0–34.0)
MCHC: 32.4 g/dL (ref 30.0–36.0)
MCV: 94.9 fL (ref 80.0–100.0)
Platelets: 160 10*3/uL (ref 150–400)
RBC: 4.91 MIL/uL (ref 4.22–5.81)
RDW: 14 % (ref 11.5–15.5)
WBC: 6.1 10*3/uL (ref 4.0–10.5)
nRBC: 0 % (ref 0.0–0.2)

## 2023-06-26 LAB — BASIC METABOLIC PANEL
Anion gap: 7 (ref 5–15)
BUN: 16 mg/dL (ref 8–23)
CO2: 27 mmol/L (ref 22–32)
Calcium: 9.3 mg/dL (ref 8.9–10.3)
Chloride: 107 mmol/L (ref 98–111)
Creatinine, Ser: 1.35 mg/dL — ABNORMAL HIGH (ref 0.61–1.24)
GFR, Estimated: 58 mL/min — ABNORMAL LOW (ref >60)
Glucose, Bld: 101 mg/dL — ABNORMAL HIGH (ref 70–99)
Potassium: 4.7 mmol/L (ref 3.5–5.1)
Sodium: 141 mmol/L (ref 135–145)

## 2023-06-26 LAB — BRAIN NATRIURETIC PEPTIDE: B Natriuretic Peptide: 329.2 pg/mL — ABNORMAL HIGH (ref 0.0–100.0)

## 2023-06-26 NOTE — H&P (View-Only) (Signed)
 Advanced Heart Failure Clinic Note  Date:  06/26/2023   ID:  [id number] Bradley [name], DOB [birth year], MRN [id number]  Location: Home  Provider location: [place] [facility] Type of Visit: Established patient  PCP:  Bradley Covey, MD  Primary Cardiologist:  Bradley Pates, MD HF Cardiologist: Dr. Gala [name]  Chief Complaint: Heart Failure follow-up   HPI: Bradley [name] is a 67 y.o. y/o male with a complicated past medical history including thyroid cancer (s/p thyroidectomy), HTN, depression, and congenital AS s/p aortic vavulotomy at age 8 with subsequent Bentall procedure in 2001 with a pericardial tissue valve.  He had MSSA bacteremia and bacterial endocarditis in 08/2013 complicated by renal failure requiring dialysis for a short period of time.  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     Admitted 11/2014 with monomorphic VT. Cath with normal coronaries.  Echo EF 30-35% -> STJ dual chamber ICD implant  Echo 9/21 EF 35-40% Echo 11/23 EF 25-30%. RV mildly down. AVR ok  Follow up 3/24, doing well NYHA II-III and volume stable on torsemide 20 mg 3x/week.   CPX 10/24 Resting HR: 58 Peak HR: 105   (68% age predicted max HR)  BP rest: 110/62 BP peak: 162/64  Peak VO2: 13.8 (53% predicted peak VO2)  VE/VCO2 slope:  35  Peak RER: 1.28  VE/MVV:  43%  O2pulse:  11   (73% predicted O2pulse)   Today he returns for HF follow up with his wife. Since we last saw him he had CPX. Wife says he has been a little dopey lately. Like brain fog.She worried he is smoking something. Feels more rundown. Can do ADLs and taking care of his chickens. Gets SOB with going up steps. BP ok. No CP or edema.   Echo today 06/26/23  EF 20-25% RV moderately HK   Cardiac Studies: Echo (11/23): EF 25-30%, RV mildly down, AVR ok Echo (7/22): EF 35-40%  Echo  (12/20): EF 30-35%  Echo (7/19): EF 30-35% AVR stable.  Echo (5/18): EF 25-30%    CPX (01/01/17)  FEV1 3.40 (86%)        FEV1/FVC 83 (107%)        MVV 163 (108%) Resting HR: 72 Peak HR: 129   (80% age predicted max HR) BP rest: 118/70 BP peak: 174/84  Peak VO2: 21.1 (73% predicted peak VO2) VE/VCO2 slope:  35 OUES: 1.96 Peak RER:  1.22 Ventilatory Threshold: 16.6 (58% predicted or measured peak VO2) VE/MVV:  50% O2pulse:  15   (100% predicted O2pulse)   Past Medical History:  Diagnosis Date   Anxiety    Aortic stenosis    s/p Bentall with bioprosthetic AVR 02/2010; Last echo (9/11): Moderate LVH, EF 45-50%, AVR functioning appropriately, aortic valve mean gradient 21, diastolic dysfunction. Chest MRA (2/13): Mild to moderate dilatation at the sinus of Valsalva at 4.1 cm, mild dilatation ascending aorta distal to the tube graft at 3.9 cm, moderate dilatation of the innominate artery a 2.1 cm;     CHF (congestive heart failure) (HCC)    Depression    ESRD on dialysis (HCC)    a. 09/2013 felt to be related to gentamycin b. no longer requiring HD   Hx of cardiac cath    a. LHC in 02/2010: normal cors   Hx of echocardiogram 02/2014   Echo (8/15):  Mod LV, EF  35-40%, Gr 1 DD, AVR ok (mean 14 mmHg), mild LAE   Hypertension    Hypothyroidism, postsurgical    Prosthetic valve endocarditis (HCC)    Staphylococcus aureus bacteremia    Thyroid cancer (HCC)    Ventricular tachycardia (HCC)    a. s/p STJ ICD   Past Surgical History:  Procedure Laterality Date   AORTIC VALVE REPAIR  1968   AORTIC VALVE REPLACEMENT  2011   AV FISTULA PLACEMENT Left 10/08/2013   Procedure: ARTERIOVENOUS (AV) FISTULA CREATION- LEFT ARM; Radial Cephalic ;  Surgeon: Bradley Ochoa, MD;  Location: [facility] OR;  Service: Vascular;  Laterality: Left;   BENTALL PROCEDURE N/A 10/28/2013   Procedure: REDO BENTALL PROCEDURE, debridment of aoritc root abscess, replacement of aortic root, ascending aorta and aortic valve with  homograft. Insertion of left femoral arterial line;  Surgeon: Bradley Ovens, MD;  Location: [facility] OR;  Service: Open Heart Surgery;  Laterality: N/A;   BIOPSY  07/22/2019   Procedure: BIOPSY;  Surgeon: Bradley Rist, MD;  Location: [facility];  Service: Gastroenterology;;   CARDIAC CATHETERIZATION  03/02/2010   NORMAL CORONARY ARTERY   CARDIAC CATHETERIZATION N/A 01/01/2015   Procedure: Coronary/Graft Angiography;  Surgeon: Bradley Bihari, MD;  Location: [facility] INVASIVE CV LAB;  Service: Cardiovascular;  Laterality: N/A;   CARDIAC VALVE REPLACEMENT     COLONOSCOPY WITH PROPOFOL N/A 07/22/2019   Procedure: COLONOSCOPY WITH PROPOFOL;  Surgeon: Bradley Rist, MD;  Location: [facility] ENDOSCOPY;  Service: Gastroenterology;  Laterality: N/A;   EP IMPLANTABLE DEVICE N/A 01/01/2015   Procedure: ICD Implant;  Surgeon: Bradley Salvia, MD;  Location: [facility] INVASIVE CV LAB;  Service: Cardiovascular;  Laterality: N/A;   HEMOSTASIS CLIP PLACEMENT  07/22/2019   Procedure: HEMOSTASIS CLIP PLACEMENT;  Surgeon: Bradley Rist, MD;  Location: [facility] ENDOSCOPY;  Service: Gastroenterology;;   INTRAOPERATIVE TRANSESOPHAGEAL ECHOCARDIOGRAM N/A 10/28/2013   Procedure: INTRAOPERATIVE TRANSESOPHAGEAL ECHOCARDIOGRAM;  Surgeon: Bradley Ovens, MD;  Location: [facility] OR;  Service: Open Heart Surgery;  Laterality: N/A;   PERIPHERAL VASCULAR CATHETERIZATION N/A 01/01/2015   Procedure: Aortic Arch Angiography;  Surgeon: Bradley Bihari, MD;  Location: [facility] INVASIVE CV LAB;  Service: Cardiovascular;  Laterality: N/A;   POLYPECTOMY  07/22/2019   Procedure: POLYPECTOMY;  Surgeon: Bradley Rist, MD;  Location: [facility] ENDOSCOPY;  Service: Gastroenterology;;   SHOULDER ARTHROSCOPY W/ ROTATOR CUFF REPAIR Right 2012   STERNOTOMY     REDO   TEE WITHOUT CARDIOVERSION N/A 10/24/2013   Procedure: TRANSESOPHAGEAL ECHOCARDIOGRAM (TEE);  Surgeon: Bradley Masson, MD;  Location: [facility] ENDOSCOPY;  Service: Cardiovascular;  Laterality: N/A;    THYROIDECTOMY  ~ 2005   TRANSTHORACIC ECHOCARDIOGRAM  03/2010   SHOWED MILD REDUCTION OF LV FUNCTION   Current Outpatient Medications  Medication Sig Dispense Refill   acetaminophen (TYLENOL) 325 MG tablet Take 325 mg by mouth 2 (two) times daily as needed for moderate pain or headache.     ARIPiprazole (ABILIFY) 5 MG tablet Take 5 mg by mouth daily.     aspirin EC 81 MG tablet Take 81 mg by mouth at bedtime.      atorvastatin (LIPITOR) 40 MG tablet TAKE 1 TABLET BY MOUTH EVERY DAY 30 tablet 1   carvedilol (COREG) 6.25 MG tablet TAKE 1 TABLET BY MOUTH TWICE A DAY WITH FOOD 180 tablet 3   clonazePAM (KLONOPIN) 1 MG tablet TAKE 1 TABLET BY MOUTH TWICE A DAY *NOT FOR REGULAR USE* 30 tablet 0  docusate sodium (COLACE) 100 MG capsule Take 100 mg by mouth daily as needed.     doxazosin (CARDURA) 1 MG tablet Take 1 tablet (1 mg total) by mouth daily. For SBP greater than 140 30 tablet 11   Ensure (ENSURE) Take 237 mLs by mouth daily as needed. 237 mL 12   ENTRESTO 97-103 MG TAKE 1 TABLET BY MOUTH TWICE A DAY 180 tablet 1   FARXIGA 10 MG TABS tablet TAKE 1 TABLET BY MOUTH EVERY DAY 30 tablet 11   furosemide (LASIX) 20 MG tablet TAKE 1 TABLET BY MOUTH 4 TIMES A WEEK. 48 tablet 3   levothyroxine (SYNTHROID) 150 MCG tablet TAKE 1 TABLET BY MOUTH EVERY DAY 90 tablet 0   mexiletine (MEXITIL) 150 MG capsule TAKE 2 CAPSULES BY MOUTH EVERY MORNING AND 2 CAPSULES BY MOUTH EVERY EVENING. 360 capsule 3   omeprazole (PRILOSEC) 20 MG capsule TAKE 1 CAPSULE BY MOUTH EVERY DAY 90 capsule 1   ranolazine (RANEXA) 500 MG 12 hr tablet TAKE 1 TABLET BY MOUTH TWICE A DAY 180 tablet 3   Salicylic Acid (WART REMOVER EX) Apply 1 application  topically daily as needed (foot corns).     sertraline (ZOLOFT) 50 MG tablet TAKE 1 TABLET BY MOUTH EVERY DAY 90 tablet 2   No current facility-administered medications for this encounter.   Allergies:   Oxycodone, Iodinated contrast media, and Rifampin   Social History:  The  patient  reports that he has never smoked. He has never used smokeless tobacco. He reports that he does not drink alcohol and does not use drugs.   Family History:  The patient's family history includes Heart disease in his father; Hypertension in his father and mother; Kidney Stones in his sister; Stroke in his father; Thyroid cancer in his sister and sister.   ROS:  Please see the history of present illness.   All other systems are personally reviewed and negative.   Recent Labs: 11/17/2022: ALT 33; Pro B Natriuretic peptide (BNP) 260.0; TSH 4.11 03/18/2023: B Natriuretic Peptide 194.9; BUN 13; Creatinine, Ser 1.09; Hemoglobin 17.7; Platelets 166; Potassium 4.5; Sodium 139  Personally reviewed   Wt Readings from Last 3 Encounters:  06/26/23 87.7 kg (193 lb 6.4 oz)  04/16/23 85.3 kg (188 lb)  04/13/23 84.6 kg (186 lb 9.6 oz)    BP 122/80   Pulse (!) 55   Wt 87.7 kg (193 lb 6.4 oz)   SpO2 97%   BMI 26.23 kg/m   Physical Exam General:  Walked into clinic No resp difficulty HEENT: normal Neck: supple. no JVD. Carotids 2+ bilat; no bruits. No lymphadenopathy or thryomegaly appreciated. Cor: PMI nondisplaced. Regular rate & rhythm. No rubs, gallops or murmurs. Lungs: clear Abdomen: soft, nontender, nondistended. No hepatosplenomegaly. No bruits or masses. Good bowel sounds. Extremities: no cyanosis, clubbing, rash, edema Neuro: alert & orientedx3, cranial nerves grossly intact. moves all 4 extremities w/o difficulty. Affect pleasant   Device interrogation (personally reviewed): Corvue ok. No VT/AF, < 1% VP  ASSESSMENT AND PLAN:  1. Chronic Systolic HF  - NICM, s/p St Jude ICD - Echo (5/17): EF 25-30%, Mild MR, Mod LAE, RV mildly dilated, Mild RAE.  - Echo (12/21/16): EF 25-30%. IW AK RV mild-mod  - Echo (02/20/18): EF 30-35% AVR stable.  - Echo (2020): EF 25%  - CPX (09/2015) with moderate HF limitation.  - CPX (6/18) much improved. Peak VO2: 21.1 (73% predicted peak VO2) VE/VCO2  slope: 35 - CPX 10/24 Peak  VO2: 13.8 (53% predicted peak VO2) VE/VCO2 slope:  35 Peak RER: 1.28 - Echo (9/21): EF 35-40% - Echo (2022): EF 35-40% RV ok  - Echo (11/23): EF 25-30% AVR stable - Echo today 06/16/23 EF 20-25% - Continues to decline NYHA IIIB-IV. Volume ok.  - Continue Lasix 20 mg 3x/week + PRN - Continue Entresto 97/103 mg bid - Continue Coreg 6.25 mg bid (bradycardic at higher doses) - Continue Farxiga 10 mg daily. - Not on spiro due to K persistently > 5 - Long talk about his situation. He seems to be slowly declining. Doubt VAD candidate given 2 previous sternotomies and he is not overly interested either, We discussed potential RHC to further assess, We have decided on watchful waiting. He will contact me if he changes his mind.  - Doubt he would benefit from CCM or Barostim given how advaced his HF is  2. VT/NICM - Quiescent on mexilitene/Ranexa - ICD interrogated personally in clinic as above  3. HTN - BP stable - GDMT as above.  4. Valvular heart disease - s/p bentall in 2001 and re-do bentall 2015.  - AVR stable on echo today - Reminded about need for SBE  5. Depression - Stable.   I spent a total of 45 minutes today: 1) reviewing the patient's medical records including previous charts, labs and recent notes from other providers; 2) examining the patient and counseling them on their medical issues/explaining the plan of care; 3) adjusting meds as needed and 4) ordering lab work or other needed tests.   Signed, Arvilla Meres, MD  06/26/2023 3:43 PM  [facility] [address] Heart and Vascular [place] [address] [phone] (office) [phone] (fax)'

## 2023-06-26 NOTE — Progress Notes (Incomplete)
Advanced Heart Failure Clinic Note  Date:  06/26/2023   ID:  Bradley [name], DOB [birth year], MRN [id number]  Location: Home  Provider location: [facility] Type of Visit: Established patient  PCP:  Kristian Covey, MD  Primary Cardiologist:  Dietrich Pates, MD HF Cardiologist: Dr. Gala Romney  Chief Complaint: Heart Failure follow-up   HPI: Bradley [name] is a 67 y.o. y/o male with a complicated past medical history including thyroid cancer (s/p thyroidectomy), HTN, depression, and congenital AS s/p aortic vavulotomy at age 8 with subsequent Bentall procedure in 2001 with a pericardial tissue valve.  He had MSSA bacteremia and bacterial endocarditis in 08/2013 complicated by renal failure requiring dialysis for a short period of time.  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     Admitted 11/2014 with monomorphic VT. Cath with normal coronaries.  Echo EF 30-35% -> STJ dual chamber ICD implant  Echo 9/21 EF 35-40% Echo 11/23 EF 25-30%. RV mildly down. AVR ok  Follow up 3/24, doing well NYHA II-III and volume stable on torsemide 20 mg 3x/week.   CPX 10/24 Resting HR: 58 Peak HR: 105   (68% age predicted max HR)  BP rest: 110/62 BP peak: 162/64  Peak VO2: 13.8 (53% predicted peak VO2)  VE/VCO2 slope:  35  Peak RER: 1.28  VE/MVV:  43%  O2pulse:  11   (73% predicted O2pulse)   Today he returns for HF follow up with his wife. Since we last saw him he had CPX. Wife says he has been a little dopey lately. Like brain fog.She worried he is smoking something. Feels more rundown. Can do ADLs and taking care of his chickens. Gets SOB with going up steps. BP ok. No CP or edema.   Echo today 06/26/23  EF 20-25% RV moderately HK   Cardiac Studies: Echo (11/23): EF 25-30%, RV mildly down, AVR ok Echo (7/22): EF 35-40%  Echo  (12/20): EF 30-35%  Echo (7/19): EF 30-35% AVR stable.  Echo (5/18): EF 25-30%    CPX (01/01/17)  FEV1 3.40 (86%)        FEV1/FVC 83 (107%)        MVV 163 (108%) Resting HR: 72 Peak HR: 129   (80% age predicted max HR) BP rest: 118/70 BP peak: 174/84  Peak VO2: 21.1 (73% predicted peak VO2) VE/VCO2 slope:  35 OUES: 1.96 Peak RER:  1.22 Ventilatory Threshold: 16.6 (58% predicted or measured peak VO2) VE/MVV:  50% O2pulse:  15   (100% predicted O2pulse)   Past Medical History:  Diagnosis Date   Anxiety    Aortic stenosis    s/p Bentall with bioprosthetic AVR 02/2010; Last echo (9/11): Moderate LVH, EF 45-50%, AVR functioning appropriately, aortic valve mean gradient 21, diastolic dysfunction. Chest MRA (2/13): Mild to moderate dilatation at the sinus of Valsalva at 4.1 cm, mild dilatation ascending aorta distal to the tube graft at 3.9 cm, moderate dilatation of the innominate artery a 2.1 cm;     CHF (congestive heart failure) (HCC)    Depression    ESRD on dialysis (HCC)    a. 09/2013 felt to be related to gentamycin b. no longer requiring HD   Hx of cardiac cath    a. LHC in 02/2010: normal cors   Hx of echocardiogram 02/2014   Echo (8/15):  Mod LV, EF  35-40%, Gr 1 DD, AVR ok (mean 14 mmHg), mild LAE   Hypertension    Hypothyroidism, postsurgical    Prosthetic valve endocarditis (HCC)    Staphylococcus aureus bacteremia    Thyroid cancer (HCC)    Ventricular tachycardia (HCC)    a. s/p STJ ICD   Past Surgical History:  Procedure Laterality Date   AORTIC VALVE REPAIR  1968   AORTIC VALVE REPLACEMENT  2011   AV FISTULA PLACEMENT Left 10/08/2013   Procedure: ARTERIOVENOUS (AV) FISTULA CREATION- LEFT ARM; Radial Cephalic ;  Surgeon: Pryor Ochoa, MD;  Location: [facility] OR;  Service: Vascular;  Laterality: Left;   BENTALL PROCEDURE N/A 10/28/2013   Procedure: REDO BENTALL PROCEDURE, debridment of aoritc root abscess, replacement of aortic root, ascending aorta and aortic valve with  homograft. Insertion of left femoral arterial line;  Surgeon: Delight Ovens, MD;  Location: [facility] OR;  Service: Open Heart Surgery;  Laterality: N/A;   BIOPSY  07/22/2019   Procedure: BIOPSY;  Surgeon: Sherrilyn Rist, MD;  Location: [facility];  Service: Gastroenterology;;   CARDIAC CATHETERIZATION  03/02/2010   NORMAL CORONARY ARTERY   CARDIAC CATHETERIZATION N/A 01/01/2015   Procedure: Coronary/Graft Angiography;  Surgeon: Lennette Bihari, MD;  Location: [facility] INVASIVE CV LAB;  Service: Cardiovascular;  Laterality: N/A;   CARDIAC VALVE REPLACEMENT     COLONOSCOPY WITH PROPOFOL N/A 07/22/2019   Procedure: COLONOSCOPY WITH PROPOFOL;  Surgeon: Sherrilyn Rist, MD;  Location: [facility] ENDOSCOPY;  Service: Gastroenterology;  Laterality: N/A;   EP IMPLANTABLE DEVICE N/A 01/01/2015   Procedure: ICD Implant;  Surgeon: Duke Salvia, MD;  Location: [facility] INVASIVE CV LAB;  Service: Cardiovascular;  Laterality: N/A;   HEMOSTASIS CLIP PLACEMENT  07/22/2019   Procedure: HEMOSTASIS CLIP PLACEMENT;  Surgeon: Sherrilyn Rist, MD;  Location: [facility] ENDOSCOPY;  Service: Gastroenterology;;   INTRAOPERATIVE TRANSESOPHAGEAL ECHOCARDIOGRAM N/A 10/28/2013   Procedure: INTRAOPERATIVE TRANSESOPHAGEAL ECHOCARDIOGRAM;  Surgeon: Delight Ovens, MD;  Location: [facility] OR;  Service: Open Heart Surgery;  Laterality: N/A;   PERIPHERAL VASCULAR CATHETERIZATION N/A 01/01/2015   Procedure: Aortic Arch Angiography;  Surgeon: Lennette Bihari, MD;  Location: [facility] INVASIVE CV LAB;  Service: Cardiovascular;  Laterality: N/A;   POLYPECTOMY  07/22/2019   Procedure: POLYPECTOMY;  Surgeon: Sherrilyn Rist, MD;  Location: [facility] ENDOSCOPY;  Service: Gastroenterology;;   SHOULDER ARTHROSCOPY W/ ROTATOR CUFF REPAIR Right 2012   STERNOTOMY     REDO   TEE WITHOUT CARDIOVERSION N/A 10/24/2013   Procedure: TRANSESOPHAGEAL ECHOCARDIOGRAM (TEE);  Surgeon: Lars Masson, MD;  Location: [facility] ENDOSCOPY;  Service: Cardiovascular;  Laterality: N/A;    THYROIDECTOMY  ~ 2005   TRANSTHORACIC ECHOCARDIOGRAM  03/2010   SHOWED MILD REDUCTION OF LV FUNCTION   Current Outpatient Medications  Medication Sig Dispense Refill   acetaminophen (TYLENOL) 325 MG tablet Take 325 mg by mouth 2 (two) times daily as needed for moderate pain or headache.     ARIPiprazole (ABILIFY) 5 MG tablet Take 5 mg by mouth daily.     aspirin EC 81 MG tablet Take 81 mg by mouth at bedtime.      atorvastatin (LIPITOR) 40 MG tablet TAKE 1 TABLET BY MOUTH EVERY DAY 30 tablet 1   carvedilol (COREG) 6.25 MG tablet TAKE 1 TABLET BY MOUTH TWICE A DAY WITH FOOD 180 tablet 3   clonazePAM (KLONOPIN) 1 MG tablet TAKE 1 TABLET BY MOUTH TWICE A DAY *NOT FOR REGULAR USE* 30 tablet 0  docusate sodium (COLACE) 100 MG capsule Take 100 mg by mouth daily as needed.     doxazosin (CARDURA) 1 MG tablet Take 1 tablet (1 mg total) by mouth daily. For SBP greater than 140 30 tablet 11   Ensure (ENSURE) Take 237 mLs by mouth daily as needed. 237 mL 12   ENTRESTO 97-103 MG TAKE 1 TABLET BY MOUTH TWICE A DAY 180 tablet 1   FARXIGA 10 MG TABS tablet TAKE 1 TABLET BY MOUTH EVERY DAY 30 tablet 11   furosemide (LASIX) 20 MG tablet TAKE 1 TABLET BY MOUTH 4 TIMES A WEEK. 48 tablet 3   levothyroxine (SYNTHROID) 150 MCG tablet TAKE 1 TABLET BY MOUTH EVERY DAY 90 tablet 0   mexiletine (MEXITIL) 150 MG capsule TAKE 2 CAPSULES BY MOUTH EVERY MORNING AND 2 CAPSULES BY MOUTH EVERY EVENING. 360 capsule 3   omeprazole (PRILOSEC) 20 MG capsule TAKE 1 CAPSULE BY MOUTH EVERY DAY 90 capsule 1   ranolazine (RANEXA) 500 MG 12 hr tablet TAKE 1 TABLET BY MOUTH TWICE A DAY 180 tablet 3   Salicylic Acid (WART REMOVER EX) Apply 1 application  topically daily as needed (foot corns).     sertraline (ZOLOFT) 50 MG tablet TAKE 1 TABLET BY MOUTH EVERY DAY 90 tablet 2   No current facility-administered medications for this encounter.   Allergies:   Oxycodone, Iodinated contrast media, and Rifampin   Social History:  The  patient  reports that he has never smoked. He has never used smokeless tobacco. He reports that he does not drink alcohol and does not use drugs.   Family History:  The patient's family history includes Heart disease in his father; Hypertension in his father and mother; Kidney Stones in his sister; Stroke in his father; Thyroid cancer in his sister and sister.   ROS:  Please see the history of present illness.   All other systems are personally reviewed and negative.   Recent Labs: 11/17/2022: ALT 33; Pro B Natriuretic peptide (BNP) 260.0; TSH 4.11 03/18/2023: B Natriuretic Peptide 194.9; BUN 13; Creatinine, Ser 1.09; Hemoglobin 17.7; Platelets 166; Potassium 4.5; Sodium 139  Personally reviewed   Wt Readings from Last 3 Encounters:  06/26/23 87.7 kg (193 lb 6.4 oz)  04/16/23 85.3 kg (188 lb)  04/13/23 84.6 kg (186 lb 9.6 oz)    BP 122/80   Pulse (!) 55   Wt 87.7 kg (193 lb 6.4 oz)   SpO2 97%   BMI 26.23 kg/m   Physical Exam General:  Walked into clinic No resp difficulty HEENT: normal Neck: supple. no JVD. Carotids 2+ bilat; no bruits. No lymphadenopathy or thryomegaly appreciated. Cor: PMI nondisplaced. Regular rate & rhythm. No rubs, gallops or murmurs. Lungs: clear Abdomen: soft, nontender, nondistended. No hepatosplenomegaly. No bruits or masses. Good bowel sounds. Extremities: no cyanosis, clubbing, rash, edema Neuro: alert & orientedx3, cranial nerves grossly intact. moves all 4 extremities w/o difficulty. Affect pleasant   Device interrogation (personally reviewed): Corvue ok. No VT/AF, < 1% VP  ASSESSMENT AND PLAN:  1. Chronic Systolic HF  - NICM, s/p St Jude ICD - Echo (5/17): EF 25-30%, Mild MR, Mod LAE, RV mildly dilated, Mild RAE.  - Echo (12/21/16): EF 25-30%. IW AK RV mild-mod  - Echo (02/20/18): EF 30-35% AVR stable.  - Echo (2020): EF 25%  - CPX (09/2015) with moderate HF limitation.  - CPX (6/18) much improved. Peak VO2: 21.1 (73% predicted peak VO2) VE/VCO2  slope: 35 - CPX 10/24 Peak  VO2: 13.8 (53% predicted peak VO2) VE/VCO2 slope:  35 Peak RER: 1.28 - Echo (9/21): EF 35-40% - Echo (2022): EF 35-40% RV ok  - Echo (11/23): EF 25-30% AVR stable - Echo today 06/16/23 EF 20-25% - Continues to decline NYHA IIIB-IV. Volume ok.  - Continue Lasix 20 mg 3x/week + PRN - Continue Entresto 97/103 mg bid - Continue Coreg 6.25 mg bid (bradycardic at higher doses) - Continue Farxiga 10 mg daily. - Not on spiro due to K persistently > 5 - Long talk about his situation. He seems to be slowly declining. Doubt VAD candidate given 2 previous sternotomies and he is not overly interested either, We discussed potential RHC to further assess, We have decided on watchful waiting. He will contact me if he changes his mind.  - Doubt he would benefit from CCM or Barostim given how advaced his HF is  2. VT/NICM - Quiescent on mexilitene/Ranexa - ICD interrogated personally in clinic as above  3. HTN - BP stable - GDMT as above.  4. Valvular heart disease - s/p bentall in 2001 and re-do bentall 2015.  - AVR stable on echo today - Reminded about need for SBE  5. Depression - Stable.   I spent a total of 45 minutes today: 1) reviewing the patient's medical records including previous charts, labs and recent notes from other providers; 2) examining the patient and counseling them on their medical issues/explaining the plan of care; 3) adjusting meds as needed and 4) ordering lab work or other needed tests.   Signed, Arvilla Meres, MD  06/26/2023 3:43 PM  Advanced [facility] Failure Clinic [facility] [address] Heart and Vascular [place] [address] [phone] (office) [phone] (fax)'

## 2023-06-26 NOTE — Patient Instructions (Addendum)
Medication Changes:  No Changes In Medications at this time.   Lab Work:  Labs done today, your results will be available in MyChart, we will contact you for abnormal readings.  Follow-Up in: 2-3 MONTHS PLEASE CALL OUR OFFICE AROUND JANUARY 2025 TO GET SCHEDULED FOR YOUR APPOINTMENT. PHONE NUMBER IS [phone] OPTION 2   At the [facility] Clinic, you and your health needs are our priority. We have a designated team specialized in the treatment of Heart Failure. This Care Team includes your primary Heart Failure Specialized Cardiologist (physician), Advanced Practice Providers (APPs- Physician Assistants and Nurse Practitioners), and Pharmacist who all work together to provide you with the care you need, when you need it.   You may see any of the following providers on your designated Care Team at your next follow up:  Dr. Arvilla Meres Dr. Marca Ancona Dr. Dorthula Nettles Dr. Theresia Bough Tonye Becket, NP Robbie Lis, [place] [facility], [place] Brynda Peon, NP [place] Lee, NP Karle Plumber, PharmD   Please be sure to bring in all your medications bottles to every appointment.   Need to Contact us:  If you have any questions or concerns before your next appointment please send [place] a message through [place] or call our office at [phone].    TO LEAVE A MESSAGE FOR THE NURSE SELECT OPTION 2, PLEASE LEAVE A MESSAGE INCLUDING: YOUR NAME DATE OF BIRTH CALL BACK NUMBER REASON FOR CALL**this is important as we prioritize the call backs  YOU WILL RECEIVE A CALL BACK THE SAME DAY AS LONG AS YOU CALL BEFORE 4:00 PM

## 2023-06-26 NOTE — Progress Notes (Signed)
  Echocardiogram 2D Echocardiogram has been performed.  Leda Roys RDCS 06/26/2023, 2:22 PM

## 2023-06-28 LAB — ECHOCARDIOGRAM COMPLETE
Area-P 1/2: 3.54 cm2
S' Lateral: 4.8 cm
Single Plane A4C EF: 39.9 %

## 2023-07-02 ENCOUNTER — Ambulatory Visit: Payer: 59 | Attending: Internal Medicine

## 2023-07-02 DIAGNOSIS — Z95810 Presence of automatic (implantable) cardiac defibrillator: Secondary | ICD-10-CM | POA: Diagnosis not present

## 2023-07-02 DIAGNOSIS — I5022 Chronic systolic (congestive) heart failure: Secondary | ICD-10-CM | POA: Diagnosis not present

## 2023-07-04 ENCOUNTER — Telehealth (HOSPITAL_COMMUNITY): Payer: Self-pay | Admitting: Cardiology

## 2023-07-04 ENCOUNTER — Telehealth: Payer: Self-pay

## 2023-07-04 MED ORDER — PREDNISONE 50 MG PO TABS
ORAL_TABLET | ORAL | 0 refills | Status: DC
Start: 2023-07-04 — End: 2023-11-30

## 2023-07-04 MED ORDER — DIPHENHYDRAMINE HCL 50 MG PO TABS: 50.0000 mg | ORAL_TABLET | Freq: Once | ORAL | 0 refills | Status: DC

## 2023-07-04 NOTE — Telephone Encounter (Signed)
 Remote ICM transmission received.  Attempted call to patient regarding ICM remote transmission and left detailed message per DPR.  Left ICM phone number and advised to return call for any fluid symptoms or questions. Next ICM remote transmission scheduled 08/06/2023.

## 2023-07-04 NOTE — Progress Notes (Signed)
EPIC Encounter for ICM Monitoring  Patient Name: Bradley [name] is a 67 y.o. male Date: 07/04/2023 Primary Care Physican: Kristian Covey, MD Primary Cardiologist: Bensimhon Electrophysiologist: Graciela Husbands Nephrologist:  Deterding 08/23/2022 Weight: 198.2 lbs 11/13/2022 Weight: 198 lbs 04/16/2023 Office Weight: 188 lbs 06/26/2023 Office Weight: 193 lbs  Battery Longevity:  8.4 months                                              Attempted call to patient and unable to reach.  Left detailed message per DPR regarding transmission. Transmission reviewed.    CorVue thoracic impedance suggesting normal fluid levels within the last month.   Prescribed:  Furosemide 20 mg Take 1 tablet (20 mg total) by mouth 4 (four) times a week.   Per 04/16/2023 OV note from Prince Rome, NP at HF clinic he can take Furosemide 3 times week + PRN    Farxiga 10 mg take 1 tablet by mouth daily    Labs: 06/26/2023 Creatinine 1.35, BUN 16, Potassium 4.7, Sodium 141, GFR 58 03/18/2023 Creatinine 1.09, BUN 13, Potassium 4.5, Sodium 139, GFR >60 11/17/2022 Creatinine 1.33, BUN 17, Potassium 4.6, Sodium 141 10/23/2022 Creatinine 1.20, BUN 16, Potassium 4.9, Sodium 141, GFR >60 A complete set of results can be found in Results Review.   Recommendations:  Left voice mail with ICM number and encouraged to call if experiencing any fluid symptoms.   Follow-up plan: ICM clinic phone appointment on 08/06/2023.   91 day device clinic remote transmission 09/17/2023.     EP/Cardiology Office Visits:  Recall 09/24/2023 with Dr Gala Romney.  07/10/2023 Dr Graciela Husbands.     Copy of ICM check sent to Dr. Graciela Husbands.   3 month ICM trend: 07/02/2023.    12-14 Month ICM trend:     Karie Soda, RN 07/04/2023 11:07 AM

## 2023-07-04 NOTE — Telephone Encounter (Signed)
Pts wife called to report they would like to proceed with scheduling procedure that was discussed at [facility]    [facility] [address] [place] [address] Dept: [phone] Loc: [phone]  Bradley [name]  07/04/2023  You are scheduled for a Cardiac Catheterization on Monday, December 9 with Dr. Arvilla Meres.  1. Please arrive at the [facility] (Main Entrance A) at [facility]: [address] [place], [place] [address] at 7:30 AM (This time is 1.5 hour(s) before your procedure to ensure your preparation).   Free valet parking service is available. You will check in at ADMITTING. The support person will be asked to wait in the waiting room.  It is OK to have someone drop you off and come back when you are ready to be discharged.    Special note: Every effort is made to have your procedure done on time. Please understand that emergencies sometimes delay scheduled procedures.  2. Diet: Do not eat solid foods after midnight.  The patient may have clear liquids until 5am upon the day of the procedure.  3. Labs: pre procedure labs done 11/26  4. Medication instructions in preparation for your procedure:   Contrast Allergy: Yes, Please take Prednisone 50mg  by mouth at: Thirteen hours prior to cath 6:00pm on Sunday Seven hours prior to cath 12:00am on Monday And prior to leaving home please take last dose of Prednisone 50mg  and Benadryl 50mg  by mouth. 0700am     Stop taking, Lasix (Furosemide)  Monday, December 9,    Hold Farxiga starting Friday 12/6  On the morning of your procedure, take your Aspirin 81 mg and any morning medicines NOT listed above.  You may use sips of water.  5. Plan to go home the same day, you will only stay overnight if medically necessary. 6. Bring a current list of your medications and current insurance cards. 7. You MUST have a responsible person to drive  you home. 8. Someone MUST be with you the first 24 hours after you arrive home or your discharge will be delayed. 9. Please wear clothes that are easy to get on and off and wear slip-on shoes.  Thank you for allowing [place] to care for you!   -- [facility]

## 2023-07-06 ENCOUNTER — Other Ambulatory Visit: Payer: Self-pay | Admitting: Family Medicine

## 2023-07-06 ENCOUNTER — Encounter (HOSPITAL_COMMUNITY): Payer: Self-pay | Admitting: Cardiology

## 2023-07-09 ENCOUNTER — Encounter (HOSPITAL_COMMUNITY)
Admission: RE | Disposition: A | Discharge status: Home / Self Care | Payer: Self-pay | Source: Home / Self Care | Attending: Internal Medicine

## 2023-07-09 ENCOUNTER — Other Ambulatory Visit: Payer: Self-pay

## 2023-07-09 ENCOUNTER — Ambulatory Visit (HOSPITAL_COMMUNITY)
Admission: RE | Admit: 2023-07-09 | Discharge: 2023-07-09 | Disposition: A | Discharge status: Home / Self Care | Payer: 59 | Attending: Internal Medicine | Admitting: Internal Medicine

## 2023-07-09 DIAGNOSIS — N186 End stage renal disease: Secondary | ICD-10-CM | POA: Insufficient documentation

## 2023-07-09 DIAGNOSIS — I2510 Atherosclerotic heart disease of native coronary artery without angina pectoris: Secondary | ICD-10-CM

## 2023-07-09 DIAGNOSIS — Z8249 Family history of ischemic heart disease and other diseases of the circulatory system: Secondary | ICD-10-CM | POA: Insufficient documentation

## 2023-07-09 DIAGNOSIS — E890 Postprocedural hypothyroidism: Secondary | ICD-10-CM | POA: Insufficient documentation

## 2023-07-09 DIAGNOSIS — I132 Hypertensive heart and chronic kidney disease with heart failure and with stage 5 chronic kidney disease, or end stage renal disease: Secondary | ICD-10-CM | POA: Diagnosis not present

## 2023-07-09 DIAGNOSIS — Z79899 Other long term (current) drug therapy: Secondary | ICD-10-CM | POA: Diagnosis not present

## 2023-07-09 DIAGNOSIS — I5022 Chronic systolic (congestive) heart failure: Secondary | ICD-10-CM | POA: Diagnosis present

## 2023-07-09 DIAGNOSIS — F32A Depression, unspecified: Secondary | ICD-10-CM | POA: Diagnosis not present

## 2023-07-09 DIAGNOSIS — I428 Other cardiomyopathies: Secondary | ICD-10-CM | POA: Diagnosis not present

## 2023-07-09 HISTORY — PX: RIGHT/LEFT HEART CATH AND CORONARY ANGIOGRAPHY: CATH118266

## 2023-07-09 LAB — POCT I-STAT EG7
Acid-base deficit: 1 mmol/L (ref 0.0–2.0)
Acid-base deficit: 1 mmol/L (ref 0.0–2.0)
Acid-base deficit: 1 mmol/L (ref 0.0–2.0)
Bicarbonate: 24 mmol/L (ref 20.0–28.0)
Bicarbonate: 24 mmol/L (ref 20.0–28.0)
Bicarbonate: 24.3 mmol/L (ref 20.0–28.0)
Calcium, Ion: 1.26 mmol/L (ref 1.15–1.40)
Calcium, Ion: 1.12 mmol/L — ABNORMAL LOW (ref 1.15–1.40)
Calcium, Ion: 1.28 mmol/L (ref 1.15–1.40)
HCT: 45 % (ref 39.0–52.0)
HCT: 45 % (ref 39.0–52.0)
HCT: 47 % (ref 39.0–52.0)
Hemoglobin: 15.3 g/dL (ref 13.0–17.0)
Hemoglobin: 15.3 g/dL (ref 13.0–17.0)
Hemoglobin: 16 g/dL (ref 13.0–17.0)
O2 Saturation: 63 %
O2 Saturation: 56 %
O2 Saturation: 62 %
Potassium: 3.8 mmol/L (ref 3.5–5.1)
Potassium: 3.8 mmol/L (ref 3.5–5.1)
Potassium: 4.1 mmol/L (ref 3.5–5.1)
Sodium: 143 mmol/L (ref 135–145)
Sodium: 140 mmol/L (ref 135–145)
Sodium: 143 mmol/L (ref 135–145)
TCO2: 25 mmol/L (ref 22–32)
TCO2: 25 mmol/L (ref 22–32)
TCO2: 25 mmol/L (ref 22–32)
pCO2, Ven: 40 mm[Hg] — ABNORMAL LOW (ref 44–60)
pCO2, Ven: 40.4 mm[Hg] — ABNORMAL LOW (ref 44–60)
pCO2, Ven: 40.7 mm[Hg] — ABNORMAL LOW (ref 44–60)
pH, Ven: 7.387 (ref 7.25–7.43)
pH, Ven: 7.382 (ref 7.25–7.43)
pH, Ven: 7.383 (ref 7.25–7.43)
pO2, Ven: 33 mm[Hg] (ref 32–45)
pO2, Ven: 30 mm[Hg] — CL (ref 32–45)
pO2, Ven: 33 mm[Hg] (ref 32–45)

## 2023-07-09 LAB — POCT I-STAT 7, (LYTES, BLD GAS, ICA,H+H)
Acid-base deficit: 3 mmol/L — ABNORMAL HIGH (ref 0.0–2.0)
Bicarbonate: 21.9 mmol/L (ref 20.0–28.0)
Calcium, Ion: 1.13 mmol/L — ABNORMAL LOW (ref 1.15–1.40)
HCT: 44 % (ref 39.0–52.0)
Hemoglobin: 15 g/dL (ref 13.0–17.0)
O2 Saturation: 91 %
Potassium: 3.7 mmol/L (ref 3.5–5.1)
Sodium: 144 mmol/L (ref 135–145)
TCO2: 23 mmol/L (ref 22–32)
pCO2 arterial: 37 mm[Hg] (ref 32–48)
pH, Arterial: 7.381 (ref 7.35–7.45)
pO2, Arterial: 62 mm[Hg] — ABNORMAL LOW (ref 83–108)

## 2023-07-09 SURGERY — RIGHT/LEFT HEART CATH AND CORONARY ANGIOGRAPHY
Anesthesia: LOCAL

## 2023-07-09 MED ORDER — LIDOCAINE HCL (PF) 1 % IJ SOLN
INTRAMUSCULAR | Status: AC
  Filled 2023-07-09: qty 30

## 2023-07-09 MED ORDER — MIDAZOLAM HCL 2 MG/2ML IJ SOLN
INTRAMUSCULAR | Status: DC | PRN
  Administered 2023-07-09: 1 mg via INTRAVENOUS

## 2023-07-09 MED ORDER — SODIUM CHLORIDE 0.9% FLUSH
3.0000 mL | Freq: Two times a day (BID) | INTRAVENOUS | Status: DC
Start: 2023-07-09 — End: 2023-07-09

## 2023-07-09 MED ORDER — SODIUM CHLORIDE 0.9 % IV SOLN: INTRAVENOUS | Status: DC

## 2023-07-09 MED ORDER — MIDAZOLAM HCL 2 MG/2ML IJ SOLN
INTRAMUSCULAR | Status: AC
  Filled 2023-07-09: qty 2

## 2023-07-09 MED ORDER — VERAPAMIL HCL 2.5 MG/ML IV SOLN
INTRAVENOUS | Status: DC | PRN
  Administered 2023-07-09: 10 mL via INTRA_ARTERIAL

## 2023-07-09 MED ORDER — HEPARIN (PORCINE) IN NACL 1000-0.9 UT/500ML-% IV SOLN
INTRAVENOUS | Status: DC | PRN
  Administered 2023-07-09 (×2): 500 mL

## 2023-07-09 MED ORDER — SODIUM CHLORIDE 0.9% FLUSH: 3.0000 mL | INTRAVENOUS | Status: DC | PRN

## 2023-07-09 MED ORDER — FENTANYL CITRATE (PF) 100 MCG/2ML IJ SOLN
INTRAMUSCULAR | Status: AC
  Filled 2023-07-09: qty 2

## 2023-07-09 MED ORDER — IOHEXOL 350 MG/ML SOLN
INTRAVENOUS | Status: DC | PRN
  Administered 2023-07-09: 34 mL via INTRA_ARTERIAL

## 2023-07-09 MED ORDER — LIDOCAINE HCL (PF) 1 % IJ SOLN
INTRAMUSCULAR | Status: DC | PRN
  Administered 2023-07-09 (×2): 2 mL
  Administered 2023-07-09: 12 mL

## 2023-07-09 MED ORDER — LABETALOL HCL 5 MG/ML IV SOLN: 10.0000 mg | INTRAVENOUS | Status: DC | PRN

## 2023-07-09 MED ORDER — ONDANSETRON HCL 4 MG/2ML IJ SOLN: 4.0000 mg | Freq: Four times a day (QID) | INTRAMUSCULAR | Status: DC | PRN

## 2023-07-09 MED ORDER — VERAPAMIL HCL 2.5 MG/ML IV SOLN
INTRAVENOUS | Status: AC
  Filled 2023-07-09: qty 2

## 2023-07-09 MED ORDER — FENTANYL CITRATE (PF) 100 MCG/2ML IJ SOLN
INTRAMUSCULAR | Status: DC | PRN
  Administered 2023-07-09: 25 ug via INTRAVENOUS

## 2023-07-09 MED ORDER — ACETAMINOPHEN 325 MG PO TABS: 650.0000 mg | ORAL_TABLET | ORAL | Status: DC | PRN

## 2023-07-09 MED ORDER — HYDRALAZINE HCL 20 MG/ML IJ SOLN: 10.0000 mg | INTRAMUSCULAR | Status: DC | PRN

## 2023-07-09 MED ORDER — HEPARIN SODIUM (PORCINE) 1000 UNIT/ML IJ SOLN
INTRAMUSCULAR | Status: AC
  Filled 2023-07-09: qty 10

## 2023-07-09 MED ORDER — SODIUM CHLORIDE 0.9 % IV SOLN: 250.0000 mL | INTRAVENOUS | Status: DC | PRN

## 2023-07-09 MED ORDER — SODIUM CHLORIDE 0.9% FLUSH: 10.0000 mL | Freq: Two times a day (BID) | INTRAVENOUS | Status: DC

## 2023-07-09 SURGICAL SUPPLY — 18 items
CATH 5FR JL3.5 JR4 ANG PIG MP (CATHETERS) IMPLANT
CATH INFINITI 5FR JL4 (CATHETERS) IMPLANT
CATH SWAN GANZ 7F STRAIGHT (CATHETERS) IMPLANT
CLOSURE MYNX CONTROL 5F (Vascular Products) IMPLANT
DEVICE RAD COMP TR BAND LRG (VASCULAR PRODUCTS) IMPLANT
GLIDESHEATH SLEND SS 6F .021 (SHEATH) IMPLANT
GLIDESHEATH SLENDER 7FR .021G (SHEATH) IMPLANT
GUIDEWIRE .025 260CM (WIRE) IMPLANT
GUIDEWIRE INQWIRE 1.5J.035X260 (WIRE) IMPLANT
INQWIRE 1.5J .035X260CM (WIRE) ×1
PACK CARDIAC CATHETERIZATION (CUSTOM PROCEDURE TRAY) ×1 IMPLANT
SHEATH PINNACLE 5F 10CM (SHEATH) IMPLANT
SHEATH PROBE COVER 6X72 (BAG) IMPLANT
TRANSDUCER W/STOPCOCK (MISCELLANEOUS) IMPLANT
TUBING ART PRESS 72 MALE/FEM (TUBING) IMPLANT
WIRE ASAHI PROWATER 180CM (WIRE) IMPLANT
WIRE EMERALD 3MM-J .035X150CM (WIRE) IMPLANT
WIRE HI TORQ VERSACORE-J 145CM (WIRE) IMPLANT

## 2023-07-09 NOTE — Progress Notes (Addendum)
TR band removed at 1220, by MD buckley.Gauze dressing applied. Right radial level 0, clean, dry, and intact. Patient walked to the bathroom without difficulties.

## 2023-07-09 NOTE — Interval H&P Note (Signed)
History and Physical Interval Note:  07/09/2023 8:44 AM  Bradley [name]  has presented today for surgery, with the diagnosis of heart failure.  The various methods of treatment have been discussed with the patient and family. After consideration of risks, benefits and other options for treatment, the patient has consented to  Procedure(s): RIGHT HEART CATH (N/A) as a surgical intervention.  The patient's history has been reviewed, patient examined, no change in status, stable for surgery.  I have reviewed the patient's chart and labs.  Questions were answered to the patient's satisfaction.     [name]

## 2023-07-09 NOTE — Discharge Instructions (Addendum)
Femoral Site Care ?This sheet gives you information about how to care for yourself after your procedure. Your health care provider may also give you more specific instructions. If you have problems or questions, contact your health care provider. ?What can I expect after the procedure? ? ?After the procedure, it is common to have: ?Bruising that usually fades within 1-2 weeks. ?Tenderness at the site. ?Follow these instructions at home: ?Wound care ?Follow instructions from your health care provider about how to take care of your insertion site. Make sure you: ?Wash your hands with soap and water before you change your bandage (dressing). If soap and water are not available, use hand sanitizer. ?Remove your dressing as told by your health care provider. In 24 hours ?Do not take baths, swim, or use a hot tub until your health care provider approves. ?You may shower 24-48 hours after the procedure or as told by your health care provider. ?Gently wash the site with plain soap and water. ?Pat the area dry with a clean towel. ?Do not rub the site. This may cause bleeding. ?Do not apply powder or lotion to the site. Keep the site clean and dry. ?Check your femoral site every day for signs of infection. Check for: ?Redness, swelling, or pain. ?Fluid or blood. ?Warmth. ?Pus or a bad smell. ?Activity ?For the first 2-3 days after your procedure, or as long as directed: ?Avoid climbing stairs as much as possible. ?Do not squat. ?Do not lift anything that is heavier than 10 lb (4.5 kg), or the limit that you are told, until your health care provider says that it is safe. For 5 days ?Rest as directed. ?Avoid sitting for a long time without moving. Get up to take short walks every 1-2 hours. ?Do not drive for 24 hours if you were given a medicine to help you relax (sedative). ?General instructions ?Take over-the-counter and prescription medicines only as told by your health care provider. ?Keep all follow-up visits as told by  your health care provider. This is important. ?Contact a health care provider if you have: ?A fever or chills. ?You have redness, swelling, or pain around your insertion site. ?Get help right away if: ?The catheter insertion area swells very fast. ?You pass out. ?You suddenly start to sweat or your skin gets clammy. ?The catheter insertion area is bleeding, and the bleeding does not stop when you hold steady pressure on the area. ?The area near or just beyond the catheter insertion site becomes pale, cool, tingly, or numb. ?These symptoms may represent a serious problem that is an emergency. Do not wait to see if the symptoms will go away. Get medical help right away. Call your local emergency services (911 in the [place] Do not drive yourself to the hospital. ?Summary ?After the procedure, it is common to have bruising that usually fades within 1-2 weeks. ?Check your femoral site every day for signs of infection. ?Do not lift anything that is heavier than 10 lb (4.5 kg), or the limit that you are told, until your health care provider says that it is safe. ?This information is not intended to replace advice given to you by your health care provider. Make sure you discuss any questions you have with your health care provider. ?Document Revised: 07/30/2017 Document Reviewed: 07/30/2017 ?Elsevier Patient Education ? [place]. ? ?Drink plenty of fluids for 48 hours and keep wrist elevated at heart level for 24 hours ? ?Radial Site Care ? ? ?This sheet gives you  information about how to care for yourself after your procedure. Your health care provider may also give you more specific instructions. If you have problems or questions, contact your health care provider. ?What can I expect after the procedure? ?After the procedure, it is common to have: ?Bruising and tenderness at the catheter insertion area. ?Follow these instructions at home: ?Medicines ?Take over-the-counter and prescription medicines only as told  by your health care provider. ?Insertion site care ?Follow instructions from your health care provider about how to take care of your insertion site. Make sure you: ?Wash your hands with soap and water before you change your bandage (dressing). If soap and water are not available, use hand sanitizer. ?Remove your dressing as told by your health care provider. In 24 hours ?Check your insertion site every day for signs of infection. Check for: ?Redness, swelling, or pain. ?Fluid or blood. ?Pus or a bad smell. ?Warmth. ?Do not take baths, swim, or use a hot tub until your health care provider approves. ?You may shower 24-48 hours after the procedure, or as directed by your health care provider. ?Remove the dressing and gently wash the site with plain soap and water. ?Pat the area dry with a clean towel. ?Do not rub the site. That could cause bleeding. ?Do not apply powder or lotion to the site. ?Activity ? ? ?For 24 hours after the procedure, or as directed by your health care provider: ?Do not flex or bend the affected arm. ?Do not push or pull heavy objects with the affected arm. ?Do not drive yourself home from the hospital or clinic. You may drive 24 hours after the procedure unless your health care provider tells you not to. ?Do not operate machinery or power tools. ?Do not lift anything that is heavier than 10 lb (4.5 kg), or the limit that you are told, until your health care provider says that it is safe.  For 4 days ?Ask your health care provider when it is okay to: ?Return to work or school. ?Resume usual physical activities or sports. ?Resume sexual activity. ?General instructions ?If the catheter site starts to bleed, raise your arm and put firm pressure on the site. If the bleeding does not stop, get help right away. This is a medical emergency. ?If you went home on the same day as your procedure, a responsible adult should be with you for the first 24 hours after you arrive home. ?Keep all follow-up  visits as told by your health care provider. This is important. ?Contact a health care provider if: ?You have a fever. ?You have redness, swelling, or yellow drainage around your insertion site. ?Get help right away if: ?You have unusual pain at the radial site. ?The catheter insertion area swells very fast. ?The insertion area is bleeding, and the bleeding does not stop when you hold steady pressure on the area. ?Your arm or hand becomes pale, cool, tingly, or numb. ?These symptoms may represent a serious problem that is an emergency. Do not wait to see if the symptoms will go away. Get medical help right away. Call your local emergency services (911 in the [place] Do not drive yourself to the hospital. ?Summary ?After the procedure, it is common to have bruising and tenderness at the site. ?Follow instructions from your health care provider about how to take care of your radial site wound. Check the wound every day for signs of infection. ?Do not lift anything that is heavier than 10 lb (4.5 kg), or the  limit that you are told, until your health care provider says that it is safe. ?This information is not intended to replace advice given to you by your health care provider. Make sure you discuss any questions you have with your health care provider. ?Document Revised: 08/22/2017 Document Reviewed: 08/22/2017 ?Elsevier Patient Education ? [place].  ? ?

## 2023-07-10 ENCOUNTER — Ambulatory Visit: Payer: 59 | Admitting: Internal Medicine

## 2023-07-10 ENCOUNTER — Encounter (HOSPITAL_COMMUNITY): Payer: Self-pay | Admitting: Internal Medicine

## 2023-07-10 ENCOUNTER — Other Ambulatory Visit: Payer: Self-pay | Admitting: Family Medicine

## 2023-07-10 MED FILL — Heparin Sodium (Porcine) Inj 1000 Unit/ML: INTRAMUSCULAR | Qty: 10 | Status: AC

## 2023-07-11 NOTE — Progress Notes (Signed)
Remote ICD transmission.   

## 2023-07-24 ENCOUNTER — Other Ambulatory Visit: Payer: Self-pay | Admitting: Internal Medicine

## 2023-07-28 ENCOUNTER — Other Ambulatory Visit (HOSPITAL_COMMUNITY): Payer: Self-pay | Admitting: Internal Medicine

## 2023-08-03 ENCOUNTER — Other Ambulatory Visit: Payer: Self-pay | Admitting: Family Medicine

## 2023-08-06 ENCOUNTER — Ambulatory Visit: Payer: 59 | Attending: Internal Medicine

## 2023-08-06 DIAGNOSIS — Z95810 Presence of automatic (implantable) cardiac defibrillator: Secondary | ICD-10-CM | POA: Diagnosis not present

## 2023-08-06 DIAGNOSIS — I5022 Chronic systolic (congestive) heart failure: Secondary | ICD-10-CM

## 2023-08-08 NOTE — Progress Notes (Signed)
 EPIC Encounter for ICM Monitoring  Patient Name: Bradley [name] is a 68 y.o. male Date: 08/08/2023 Primary Care Physican: Micheal Wolm ORN, MD Primary Cardiologist: Bensimhon Electrophysiologist: Fernande Nephrologist:  Deterding 08/23/2022 Weight: 198.2 lbs 11/13/2022 Weight: 198 lbs 04/16/2023 Office Weight: 188 lbs 06/26/2023 Office Weight: 193 lbs   Battery Longevity:  7 months                                              Transmission reviewed.    CorVue thoracic impedance suggesting normal fluid levels since 07/15/2023.   Prescribed:  Furosemide  20 mg Take 1 tablet (20 mg total) by mouth 4 (four) times a week.   Per 04/16/2023 OV note from Harlene Gainer, NP at HF clinic he can take Furosemide  3 times week + PRN      Labs: 06/26/2023 Creatinine 1.35, BUN 16, Potassium 4.7, Sodium 141, GFR 58 03/18/2023 Creatinine 1.09, BUN 13, Potassium 4.5, Sodium 139, GFR >60 11/17/2022 Creatinine 1.33, BUN 17, Potassium 4.6, Sodium 141 10/23/2022 Creatinine 1.20, BUN 16, Potassium 4.9, Sodium 141, GFR >60 A complete set of results can be found in Results Review.   Recommendations:  No changes.   Follow-up plan: ICM clinic phone appointment on 09/10/2023.   91 day device clinic remote transmission 09/17/2023.     EP/Cardiology Office Visits:  Recall 09/24/2023 with Dr Cherrie.     Copy of ICM check sent to Dr. Fernande.   3 month ICM trend: 08/06/2023.    12-14 Month ICM trend:     Mitzie GORMAN Garner, RN 08/08/2023 3:10 PM

## 2023-08-15 ENCOUNTER — Other Ambulatory Visit: Payer: Self-pay | Admitting: Internal Medicine

## 2023-08-16 NOTE — Telephone Encounter (Signed)
This is a CHF pt 

## 2023-09-10 ENCOUNTER — Ambulatory Visit: Payer: 59 | Attending: Internal Medicine

## 2023-09-10 DIAGNOSIS — I5022 Chronic systolic (congestive) heart failure: Secondary | ICD-10-CM

## 2023-09-10 DIAGNOSIS — Z95810 Presence of automatic (implantable) cardiac defibrillator: Secondary | ICD-10-CM | POA: Diagnosis not present

## 2023-09-12 NOTE — Progress Notes (Signed)
EPIC Encounter for ICM Monitoring  Patient Name: Bradley [name] is a 68 y.o. male Date: 09/12/2023 Primary Care Physican: Kristian Covey, MD Primary Cardiologist: Bensimhon Electrophysiologist: Graciela Husbands Nephrologist:  Deterding 08/23/2022 Weight: 198.2 lbs 11/13/2022 Weight: 198 lbs 04/16/2023 Office Weight: 188 lbs 06/26/2023 Office Weight: 193 lbs   Battery Longevity:  5.5 months                                              Attempted call to patient and unable to reach.   Transmission results reviewed.    CorVue thoracic impedance suggesting normal fluid levels with the exception of possible fluid accumulation from 2/7-2/9.   Prescribed:  Furosemide 20 mg Take 1 tablet (20 mg total) by mouth 4 (four) times a week.   Per 04/16/2023 OV note from Prince Rome, NP at HF clinic he can take Furosemide 3 times week + PRN      Labs: 06/26/2023 Creatinine 1.35, BUN 16, Potassium 4.7, Sodium 141, GFR 58 03/18/2023 Creatinine 1.09, BUN 13, Potassium 4.5, Sodium 139, GFR >60 11/17/2022 Creatinine 1.33, BUN 17, Potassium 4.6, Sodium 141 10/23/2022 Creatinine 1.20, BUN 16, Potassium 4.9, Sodium 141, GFR >60 A complete set of results can be found in Results Review.   Recommendations:  Unable to reach.     Follow-up plan: ICM clinic phone appointment on 10/22/2023.   91 day device clinic remote transmission 09/17/2023.     EP/Cardiology Office Visits:  09/26/2023 with Canary Brim, NP.  Recall 09/24/2023 with Dr Gala Romney.     Copy of ICM check sent to Dr. Graciela Husbands.  3 month ICM trend: 09/10/2023.    12-14 Month ICM trend:     Karie Soda, RN 09/12/2023 3:54 PM

## 2023-09-14 ENCOUNTER — Ambulatory Visit: Payer: 59 | Admitting: Student

## 2023-09-17 ENCOUNTER — Ambulatory Visit (INDEPENDENT_AMBULATORY_CARE_PROVIDER_SITE_OTHER): Payer: 59

## 2023-09-17 DIAGNOSIS — I5022 Chronic systolic (congestive) heart failure: Secondary | ICD-10-CM | POA: Diagnosis not present

## 2023-09-17 DIAGNOSIS — I428 Other cardiomyopathies: Secondary | ICD-10-CM

## 2023-09-18 LAB — CUP PACEART REMOTE DEVICE CHECK
Battery Remaining Longevity: 6 mo
Battery Remaining Percentage: 5 %
Battery Voltage: 2.62 V
Brady Statistic RV Percent Paced: 1 %
Date Time Interrogation Session: 20250217020022
HighPow Impedance: 70 Ohm
HighPow Impedance: 70 Ohm
Implantable Lead Connection Status: 753985
Implantable Lead Implant Date: 20160603
Implantable Lead Location: 753860
Implantable Lead Model: 7122
Implantable Pulse Generator Implant Date: 20160603
Lead Channel Impedance Value: 450 Ohm
Lead Channel Pacing Threshold Amplitude: 1.5 V
Lead Channel Pacing Threshold Pulse Width: 0.5 ms
Lead Channel Sensing Intrinsic Amplitude: 9.8 mV
Lead Channel Setting Pacing Amplitude: 2.5 V
Lead Channel Setting Pacing Pulse Width: 0.5 ms
Lead Channel Setting Sensing Sensitivity: 0.5 mV
Pulse Gen Serial Number: 7135169

## 2023-09-26 ENCOUNTER — Encounter: Payer: Self-pay | Admitting: Pulmonary Disease

## 2023-09-26 ENCOUNTER — Ambulatory Visit: Payer: 59 | Attending: Pulmonary Disease | Admitting: Pulmonary Disease

## 2023-09-26 VITALS — BP 124/70 | HR 70 | Ht 72.0 in | Wt 193.4 lb

## 2023-09-26 DIAGNOSIS — I428 Other cardiomyopathies: Secondary | ICD-10-CM

## 2023-09-26 DIAGNOSIS — I472 Ventricular tachycardia, unspecified: Secondary | ICD-10-CM | POA: Diagnosis not present

## 2023-09-26 DIAGNOSIS — I5022 Chronic systolic (congestive) heart failure: Secondary | ICD-10-CM

## 2023-09-26 DIAGNOSIS — Z95810 Presence of automatic (implantable) cardiac defibrillator: Secondary | ICD-10-CM | POA: Diagnosis not present

## 2023-09-26 LAB — CUP PACEART INCLINIC DEVICE CHECK
Battery Remaining Longevity: 5 mo
Brady Statistic RV Percent Paced: 0.03 %
Date Time Interrogation Session: 20250226173105
HighPow Impedance: 65.25 Ohm
Implantable Lead Connection Status: 753985
Implantable Lead Implant Date: 20160603
Implantable Lead Location: 753860
Implantable Lead Model: 7122
Implantable Pulse Generator Implant Date: 20160603
Lead Channel Impedance Value: 450 Ohm
Lead Channel Pacing Threshold Amplitude: 1.5 V
Lead Channel Pacing Threshold Amplitude: 1.5 V
Lead Channel Pacing Threshold Pulse Width: 0.5 ms
Lead Channel Pacing Threshold Pulse Width: 0.5 ms
Lead Channel Sensing Intrinsic Amplitude: 9.6 mV
Lead Channel Setting Pacing Amplitude: 2.5 V
Lead Channel Setting Pacing Pulse Width: 0.5 ms
Lead Channel Setting Sensing Sensitivity: 0.5 mV
Pulse Gen Serial Number: 7135169

## 2023-09-26 NOTE — Progress Notes (Addendum)
 Electrophysiology Office Note:   Date:  09/26/2023  ID:  Bradley [name], DOB 1956/03/28, MRN [id number]  Primary Cardiologist: Dietrich Pates, MD Primary Heart Failure: Arvilla Meres, MD Electrophysiologist: Sherryl Manges, MD       History of Present Illness:   Bradley [name] is a 68 y.o. male with h/o HFrEF / NICM with VT s/p ICD, congenital AS s/p balloon valvotomy at age 76 / AV repair 1968, HTN, CKD II, anxiety  seen today for cardiac clearance for inguinal hernia repair.   Since last being seen in our clinic the patient reports he has been doing well. No device related concerns. He is looking at having his inguinal hernia repaired. He began the CV approval process with Dr. Gala Romney. He had an abnormal ECHO which led to [facility] in 07/2023 by Dr. Gala Romney which showed primarily NICM with normal left coronary system, apparent high-grade narrowing at the right coronary button with adequate flow down RCA, low filling pressures with mild to moderately reduced C.O.   The patient and wife have concerns about surgical risks for hernia repair.  They worry about infection and recovery as he "has a hard time coming off the vent".   He denies chest pain, palpitations, dyspnea, PND, orthopnea, nausea, vomiting, dizziness, syncope, edema, weight gain, or early satiety.  Review of systems complete and found to be negative unless listed in HPI.   EP Information / Studies Reviewed:    EKG is ordered today. Personal review as below.  EKG Interpretation Date/Time:  Wednesday September 26 2023 13:31:09 EST Ventricular Rate:  70 PR Interval:  226 QRS Duration:  184 QT Interval:  452 QTC Calculation: 488 R Axis:   105  Text Interpretation: Sinus rhythm with 1st degree A-V block Right bundle branch block Confirmed by Bradley [name] ([id number]) on 09/26/2023 1:44:13 PM   ICD Interrogation-  reviewed in detail today,  See PACEART report.  Device History: Abbott Single Chamber ICD implanted 01/01/15 for  NICM History of appropriate therapy: No History of AAD therapy:  Amiodarone (stopped on decision, not intolerant) > switched to mexiletine + ranolazine     Studies:  ECHO 06/2023 > LVEF 20-25%, G1DD, LA / RA mildly dilated R/LHC 07/2023 > primarily NICM with normal left coronary system, apparent high-grade narrowing at right coronary button with adequate flow down RCA, low filling pressures with mild to mod reduced C.O.             Physical Exam:   VS:  BP 124/70 (BP Location: Left Arm, Patient Position: Sitting, Cuff Size: Large)   Pulse 70   Ht 6' (1.829 m)   Wt 193 lb 6.4 oz (87.7 kg)   SpO2 95%   BMI 26.23 kg/m    Wt Readings from Last 3 Encounters:  09/26/23 193 lb 6.4 oz (87.7 kg)  07/09/23 187 lb (84.8 kg)  06/26/23 193 lb 6.4 oz (87.7 kg)     GEN: Well nourished, well developed in no acute distress NECK: No JVD; No carotid bruits CARDIAC: Regular rate and rhythm, SEM, rubs, gallops RESPIRATORY:  Clear to auscultation without rales, wheezing or rhonchi  ABDOMEN: Soft, non-tender, non-distended EXTREMITIES:  No edema; No deformity   ASSESSMENT AND PLAN:    Chronic Systolic Dysfunction / NICM  with MMVT s/p Abbott single chamber ICD  -euvolemic on exam / Corvue stable  -Stable on an appropriate medical regimen -Normal ICD function -See Pace Art report -No changes today -continue mexiletine 300 mg BID  -continue ranolazine 500  mg BID  -coreg 6.25  -GDMT per Advanced HF team   Congenital AV Stenosis s/p Valvotomy, [facility] 2001 with tissue valve, redo Bentall 2015  Hx MSSA bacteremia and bacterial endocarditis in 08/2013 complicated by renal failure requiring dialysis for a short period of time.  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   -valve stable on most recent ECHO   Hypertension  -well controlled on current regimen      Pre-Operative Cardiac Clearance   Procedure:   Inguinal hernia surgery Date of Surgery:  Clearance TBD                              Surgeon:  Bradley [name] Surgeon's Group or Practice Name:  [facility] Phone number:  [phone] Fax number:  [phone] Type of Clearance Requested: Medical  Pharmacy:  Hold Aspirin Not Indicated. Type of Anesthesia:  General    Pre-operative device review within normal limits. From a device perspective, he does not need special programming for surgery as it is below the umbilicus. Will defer further medical cardiac clearance to Dr. Gala Romney given recent work up.  Family reports they were told to put it on hold by Dr. Gala Romney.  Will reach out to him regarding further work up needs / clearance.    Disposition:   Follow up with EP APP  5 months as anticipate will be at [facility]    Signed, Bradley Brim, NP-C, AGACNP-BC [facility] - Electrophysiology  09/26/2023, 5:25 PM

## 2023-09-26 NOTE — Patient Instructions (Signed)
 Medication Instructions:  Your physician recommends that you continue on your current medications as directed. Please refer to the Current Medication list given to you today.  *If you need a refill on your cardiac medications before your next appointment, please call your pharmacy*  Lab Work: None ordered If you have labs (blood work) drawn today and your tests are completely normal, you will receive your results only by: MyChart Message (if you have MyChart) OR A paper copy in the mail If you have any lab test that is abnormal or we need to change your treatment, we will call you to review the results.  Follow-Up: At [facility], you and your health needs are our priority.  As part of our continuing mission to provide you with exceptional heart care, we have created designated Provider Care Teams.  These Care Teams include your primary Cardiologist (physician) and Advanced Practice Providers (APPs -  Physician Assistants and Nurse Practitioners) who all work together to provide you with the care you need, when you need it.  Your next appointment:   5 month(s)  Provider:   Canary Brim, NP

## 2023-10-06 ENCOUNTER — Other Ambulatory Visit: Payer: Self-pay | Admitting: Family Medicine

## 2023-10-07 ENCOUNTER — Other Ambulatory Visit (HOSPITAL_COMMUNITY): Payer: Self-pay | Admitting: Internal Medicine

## 2023-10-16 ENCOUNTER — Encounter: Payer: Self-pay | Admitting: Internal Medicine

## 2023-10-22 ENCOUNTER — Ambulatory Visit: Payer: 59 | Attending: Internal Medicine

## 2023-10-22 DIAGNOSIS — I5022 Chronic systolic (congestive) heart failure: Secondary | ICD-10-CM

## 2023-10-22 DIAGNOSIS — Z95810 Presence of automatic (implantable) cardiac defibrillator: Secondary | ICD-10-CM

## 2023-10-23 NOTE — Progress Notes (Signed)
 Remote ICD transmission.

## 2023-10-23 NOTE — Addendum Note (Signed)
 Addended by: Geralyn Flash D on: 10/23/2023 01:23 PM   Modules accepted: Orders

## 2023-10-24 ENCOUNTER — Telehealth: Payer: Self-pay

## 2023-10-24 NOTE — Telephone Encounter (Signed)
 Remote ICM transmission received.  Attempted call to patient regarding ICM remote transmission and no answer.

## 2023-10-24 NOTE — Progress Notes (Signed)
 EPIC Encounter for ICM Monitoring  Patient Name: Bradley [name] is a 68 y.o. male Date: 10/24/2023 Primary Care Physican: Kristian Covey, MD Primary Cardiologist: Bensimhon Electrophysiologist: Graciela Husbands Nephrologist:  Deterding 08/23/2022 Weight: 198.2 lbs 11/13/2022 Weight: 198 lbs 04/16/2023 Office Weight: 188 lbs 06/26/2023 Office Weight: 193 lbs   Battery Longevity:  5.5 months                                             Attempted call to patient and unable to reach.    Transmission results reviewed.    CorVue thoracic impedance suggesting normal fluid levels with the exception of possible fluid accumulation from 3/15-3/19.   Prescribed:  Furosemide 20 mg Take 1 tablet (20 mg total) by mouth 4 (four) times a week.   Per 04/16/2023 OV note from Prince Rome, NP at HF clinic he can take Furosemide 3 times week + PRN      Labs: 06/26/2023 Creatinine 1.35, BUN 16, Potassium 4.7, Sodium 141, GFR 58 03/18/2023 Creatinine 1.09, BUN 13, Potassium 4.5, Sodium 139, GFR >60 11/17/2022 Creatinine 1.33, BUN 17, Potassium 4.6, Sodium 141 10/23/2022 Creatinine 1.20, BUN 16, Potassium 4.9, Sodium 141, GFR >60 A complete set of results can be found in Results Review.   Recommendations:  Unable to reach.       Follow-up plan: ICM clinic phone appointment on 11/26/2023.   91 day device clinic remote transmission 12/17/2023.     EP/Cardiology Office Visits: 01/29/2024 with Canary Brim, NP.  11/30/2023 with Dr Gala Romney.     Copy of ICM check sent to Dr. Graciela Husbands.  3 month ICM trend: 10/22/2023.    12-14 Month ICM trend:     Karie Soda, RN 10/24/2023 7:55 AM

## 2023-11-06 ENCOUNTER — Other Ambulatory Visit: Payer: Self-pay | Admitting: Family Medicine

## 2023-11-06 NOTE — Telephone Encounter (Signed)
 Called patient left a VM to return call and sch Annual

## 2023-11-08 ENCOUNTER — Other Ambulatory Visit (HOSPITAL_COMMUNITY): Payer: Self-pay | Admitting: Internal Medicine

## 2023-11-19 ENCOUNTER — Ambulatory Visit

## 2023-11-19 DIAGNOSIS — I428 Other cardiomyopathies: Secondary | ICD-10-CM

## 2023-11-19 DIAGNOSIS — I5022 Chronic systolic (congestive) heart failure: Secondary | ICD-10-CM

## 2023-11-20 LAB — CUP PACEART REMOTE DEVICE CHECK
Battery Remaining Longevity: 4 mo
Battery Remaining Percentage: 3 %
Battery Voltage: 2.6 V
Brady Statistic RV Percent Paced: 1 %
Date Time Interrogation Session: 20250421020015
HighPow Impedance: 70 Ohm
HighPow Impedance: 70 Ohm
Implantable Lead Connection Status: 753985
Implantable Lead Implant Date: 20160603
Implantable Lead Location: 753860
Implantable Lead Model: 7122
Implantable Pulse Generator Implant Date: 20160603
Lead Channel Impedance Value: 460 Ohm
Lead Channel Pacing Threshold Amplitude: 1.5 V
Lead Channel Pacing Threshold Pulse Width: 0.5 ms
Lead Channel Sensing Intrinsic Amplitude: 8.8 mV
Lead Channel Setting Pacing Amplitude: 2.5 V
Lead Channel Setting Pacing Pulse Width: 0.5 ms
Lead Channel Setting Sensing Sensitivity: 0.5 mV
Pulse Gen Serial Number: 7135169

## 2023-11-26 ENCOUNTER — Ambulatory Visit: Attending: Cardiology

## 2023-11-26 DIAGNOSIS — I5022 Chronic systolic (congestive) heart failure: Secondary | ICD-10-CM | POA: Diagnosis not present

## 2023-11-26 DIAGNOSIS — Z95810 Presence of automatic (implantable) cardiac defibrillator: Secondary | ICD-10-CM

## 2023-11-28 NOTE — Progress Notes (Signed)
 EPIC Encounter for ICM Monitoring  Patient Name: Bradley [name] is a 68 y.o. male Date: 11/28/2023 Primary Care Physican: Marquetta Sit, MD Primary Cardiologist: Bensimhon Electrophysiologist: Lawana Pray Nephrologist:  Deterding 08/23/2022 Weight: 198.2 lbs 09/26/2023 Office Weight: 193 lbs   Battery Longevity:  3.7 months                                             Transmission results reviewed.    CorVue thoracic impedance suggesting normal fluid levels with the exception of possible fluid accumulation from 4/16-4/19 and 4/24-4/26.   Prescribed:  Furosemide  20 mg Take 1 tablet (20 mg total) by mouth 4 (four) times a week.   Per 04/16/2023 OV note from Vernia Good, NP at HF clinic he can take Furosemide  3 times week + PRN      Labs: 06/26/2023 Creatinine 1.35, BUN 16, Potassium 4.7, Sodium 141, GFR 58 03/18/2023 Creatinine 1.09, BUN 13, Potassium 4.5, Sodium 139, GFR >60 11/17/2022 Creatinine 1.33, BUN 17, Potassium 4.6, Sodium 141 10/23/2022 Creatinine 1.20, BUN 16, Potassium 4.9, Sodium 141, GFR >60 A complete set of results can be found in Results Review.   Recommendations:  None    Follow-up plan: ICM clinic phone appointment on 12/31/2023.   91 day device clinic remote transmission 12/17/2023.     EP/Cardiology Office Visits: 01/29/2024 with Creighton Doffing, NP.  11/30/2023 with Dr Julane Ny.     Copy of ICM check sent to Dr. Lawana Pray.  3 month ICM trend: 11/26/2023.    12-14 Month ICM trend:     Almyra Jain, RN 11/28/2023 3:13 PM

## 2023-11-30 ENCOUNTER — Ambulatory Visit (HOSPITAL_COMMUNITY)
Admission: RE | Admit: 2023-11-30 | Discharge: 2023-11-30 | Disposition: A | Discharge status: Home / Self Care | Source: Ambulatory Visit | Attending: Internal Medicine | Admitting: Internal Medicine

## 2023-11-30 ENCOUNTER — Encounter (HOSPITAL_COMMUNITY): Payer: Self-pay | Admitting: Internal Medicine

## 2023-11-30 VITALS — BP 120/78 | HR 64 | Wt 196.4 lb

## 2023-11-30 DIAGNOSIS — I2510 Atherosclerotic heart disease of native coronary artery without angina pectoris: Secondary | ICD-10-CM

## 2023-11-30 DIAGNOSIS — Z95810 Presence of automatic (implantable) cardiac defibrillator: Secondary | ICD-10-CM | POA: Insufficient documentation

## 2023-11-30 DIAGNOSIS — F32A Depression, unspecified: Secondary | ICD-10-CM | POA: Diagnosis not present

## 2023-11-30 DIAGNOSIS — I428 Other cardiomyopathies: Secondary | ICD-10-CM | POA: Diagnosis not present

## 2023-11-30 DIAGNOSIS — I5022 Chronic systolic (congestive) heart failure: Secondary | ICD-10-CM | POA: Insufficient documentation

## 2023-11-30 DIAGNOSIS — Z8249 Family history of ischemic heart disease and other diseases of the circulatory system: Secondary | ICD-10-CM | POA: Diagnosis not present

## 2023-11-30 DIAGNOSIS — Z85850 Personal history of malignant neoplasm of thyroid: Secondary | ICD-10-CM | POA: Insufficient documentation

## 2023-11-30 DIAGNOSIS — N186 End stage renal disease: Secondary | ICD-10-CM | POA: Insufficient documentation

## 2023-11-30 DIAGNOSIS — Z79899 Other long term (current) drug therapy: Secondary | ICD-10-CM | POA: Diagnosis not present

## 2023-11-30 DIAGNOSIS — I132 Hypertensive heart and chronic kidney disease with heart failure and with stage 5 chronic kidney disease, or end stage renal disease: Secondary | ICD-10-CM | POA: Diagnosis not present

## 2023-11-30 DIAGNOSIS — I472 Ventricular tachycardia, unspecified: Secondary | ICD-10-CM | POA: Diagnosis not present

## 2023-11-30 DIAGNOSIS — Z01810 Encounter for preprocedural cardiovascular examination: Secondary | ICD-10-CM | POA: Insufficient documentation

## 2023-11-30 NOTE — Patient Instructions (Signed)
 Great to see you today!!!  Your physician recommends that you schedule a follow-up appointment in: 4 months with an echocardiogram (September), *PLEASE CALL OUR OFFICE IN JUNE TO SCHEDULE THIS APPOITMENT  If you have any questions or concerns before your next appointment please send us  a message through [place] or call our office at [phone].    TO LEAVE A MESSAGE FOR THE NURSE SELECT OPTION 2, PLEASE LEAVE A MESSAGE INCLUDING: YOUR NAME DATE OF BIRTH CALL BACK NUMBER REASON FOR CALL**this is important as we prioritize the call backs  YOU WILL RECEIVE A CALL BACK THE SAME DAY AS LONG AS YOU CALL BEFORE 4:00 PM  At the [facility], you and your health needs are our priority. As part of our continuing mission to provide you with exceptional heart care, we have created designated Provider Care Teams. These Care Teams include your primary Cardiologist (physician) and Advanced Practice Providers (APPs- Physician Assistants and Nurse Practitioners) who all work together to provide you with the care you need, when you need it.   You may see any of the following providers on your designated Care Team at your next follow up: Dr Jules Oar Dr Peder Bourdon Dr. Alwin Baars Dr. Arta Lark Amy Marijane Shoulders, NP Ruddy Corral, [place] [facility] [place], [place] Dennise Fitz, NP [place] Lee, NP Shawnee Dellen, NP Luster Salters, PharmD Bevely Brush, PharmD   Please be sure to bring in all your medications bottles to every appointment.    Thank you for choosing [facility]

## 2023-11-30 NOTE — Progress Notes (Incomplete)
 Advanced Heart Failure Clinic Note  Date:  11/30/2023   ID:  Bradley [name], DOB 07-Jun-1956, MRN [id number]  Location: Home  Provider location: [place] [facility] Type of Visit: Established patient  PCP:  Marquetta Sit, MD  Primary Cardiologist:  Ola Berger, MD HF Cardiologist: Dr. Julane Ny  Chief Complaint: Heart Failure follow-up   HPI: Bradley [name] is a 68 y.o. y/o male with a complicated past medical history including thyroid  cancer (s/p thyroidectomy), HTN, depression, and congenital AS s/p aortic vavulotomy at age 51 with subsequent Bentall procedure in 2001 with a pericardial tissue valve.  He had MSSA bacteremia and bacterial endocarditis in 08/2013 complicated by renal failure requiring dialysis for a short period of time.  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     Admitted 11/2014 with monomorphic VT. Cath with normal coronaries.  Echo EF 30-35% -> STJ dual chamber ICD implant  Echo 9/21 EF 35-40% Echo 11/23 EF 25-30%. RV mildly down. AVR ok  Follow up 3/24, doing well NYHA II-III and volume stable on torsemide 20 mg 3x/week.   CPX 10/24 Resting HR: 58 Peak HR: 105   (68% age predicted max HR)  BP rest: 110/62 BP peak: 162/64  Peak VO2: 13.8 (53% predicted peak VO2)  VE/VCO2 slope:  35  Peak RER: 1.28  VE/MVV:  43%  O2pulse:  11   (73% predicted O2pulse)   Today he returns for HF follow up with his wife. Says he is doing pretty well. Denies CP or SOB. Can easily walk 1/4 mile without stopping and handle a flight of steps. Still struggling with his hernia. No VT     Echo 06/26/23  EF 20-25% RV moderately HK   Cardiac Studies: Echo (11/23): EF 25-30%, RV mildly down, AVR ok Echo (7/22): EF 35-40%  Echo (12/20): EF 30-35%  Echo (7/19): EF 30-35% AVR stable.  Echo (5/18): EF 25-30%    CPX  (01/01/17)  FEV1 3.40 (86%)        FEV1/FVC 83 (107%)        MVV 163 (108%) Resting HR: 72 Peak HR: 129   (80% age predicted max HR) BP rest: 118/70 BP peak: 174/84  Peak VO2: 21.1 (73% predicted peak VO2) VE/VCO2 slope:  35 OUES: 1.96 Peak RER:  1.22 Ventilatory Threshold: 16.6 (58% predicted or measured peak VO2) VE/MVV:  50% O2pulse:  15   (100% predicted O2pulse)   Past Medical History:  Diagnosis Date   Anxiety    Aortic stenosis    s/p Bentall with bioprosthetic AVR 02/2010; Last echo (9/11): Moderate LVH, EF 45-50%, AVR functioning appropriately, aortic valve mean gradient 21, diastolic dysfunction. Chest MRA (2/13): Mild to moderate dilatation at the sinus of Valsalva at 4.1 cm, mild dilatation ascending aorta distal to the tube graft at 3.9 cm, moderate dilatation of the innominate artery a 2.1 cm;     CHF (congestive heart failure) (HCC)    Depression    ESRD on dialysis (HCC)    a. 09/2013 felt to be related to gentamycin b. no longer requiring HD   Hx of cardiac cath    a. LHC in 02/2010: normal cors   Hx of echocardiogram 02/2014   Echo (8/15):  Mod LV, EF 35-40%, Gr 1 DD, AVR ok (mean 14 mmHg), mild LAE   Hypertension    Hypothyroidism,  postsurgical    Prosthetic valve endocarditis    Staphylococcus aureus bacteremia    Thyroid  cancer (HCC)    Ventricular tachycardia (HCC)    a. s/p STJ ICD   Past Surgical History:  Procedure Laterality Date   AORTIC VALVE REPAIR  1968   AORTIC VALVE REPLACEMENT  2011   AV FISTULA PLACEMENT Left 10/08/2013   Procedure: ARTERIOVENOUS (AV) FISTULA CREATION- LEFT ARM; Radial Cephalic ;  Surgeon: Palma Bob, MD;  Location: [facility] OR;  Service: Vascular;  Laterality: Left;   BENTALL PROCEDURE N/A 10/28/2013   Procedure: REDO BENTALL PROCEDURE, debridment of aoritc root abscess, replacement of aortic root, ascending aorta and aortic valve with homograft. Insertion of left femoral arterial line;  Surgeon: Norita Beauvais, MD;  Location: [facility]  OR;  Service: Open Heart Surgery;  Laterality: N/A;   BIOPSY  07/22/2019   Procedure: BIOPSY;  Surgeon: Albertina Hugger, MD;  Location: [facility];  Service: Gastroenterology;;   CARDIAC CATHETERIZATION  03/02/2010   NORMAL CORONARY ARTERY   CARDIAC CATHETERIZATION N/A 01/01/2015   Procedure: Coronary/Graft Angiography;  Surgeon: Millicent Ally, MD;  Location: [facility] INVASIVE CV LAB;  Service: Cardiovascular;  Laterality: N/A;   CARDIAC VALVE REPLACEMENT     COLONOSCOPY WITH PROPOFOL  N/A 07/22/2019   Procedure: COLONOSCOPY WITH PROPOFOL ;  Surgeon: Albertina Hugger, MD;  Location: [facility] ENDOSCOPY;  Service: Gastroenterology;  Laterality: N/A;   EP IMPLANTABLE DEVICE N/A 01/01/2015   Procedure: ICD Implant;  Surgeon: Verona Goodwill, MD;  Location: [facility] INVASIVE CV LAB;  Service: Cardiovascular;  Laterality: N/A;   HEMOSTASIS CLIP PLACEMENT  07/22/2019   Procedure: HEMOSTASIS CLIP PLACEMENT;  Surgeon: Albertina Hugger, MD;  Location: [facility] ENDOSCOPY;  Service: Gastroenterology;;   INTRAOPERATIVE TRANSESOPHAGEAL ECHOCARDIOGRAM N/A 10/28/2013   Procedure: INTRAOPERATIVE TRANSESOPHAGEAL ECHOCARDIOGRAM;  Surgeon: Norita Beauvais, MD;  Location: [facility] OR;  Service: Open Heart Surgery;  Laterality: N/A;   PERIPHERAL VASCULAR CATHETERIZATION N/A 01/01/2015   Procedure: Aortic Arch Angiography;  Surgeon: Millicent Ally, MD;  Location: [facility] INVASIVE CV LAB;  Service: Cardiovascular;  Laterality: N/A;   POLYPECTOMY  07/22/2019   Procedure: POLYPECTOMY;  Surgeon: Albertina Hugger, MD;  Location: [facility] ENDOSCOPY;  Service: Gastroenterology;;   RIGHT/LEFT HEART CATH AND CORONARY ANGIOGRAPHY N/A 07/09/2023   Procedure: RIGHT/LEFT HEART CATH AND CORONARY ANGIOGRAPHY;  Surgeon: Mardell Shade, MD;  Location: [facility] INVASIVE CV LAB;  Service: Cardiovascular;  Laterality: N/A;   SHOULDER ARTHROSCOPY W/ ROTATOR CUFF REPAIR Right 2012   STERNOTOMY     REDO   TEE WITHOUT CARDIOVERSION N/A 10/24/2013   Procedure: TRANSESOPHAGEAL  ECHOCARDIOGRAM (TEE);  Surgeon: Liza Riggers, MD;  Location: [facility] ENDOSCOPY;  Service: Cardiovascular;  Laterality: N/A;   THYROIDECTOMY  ~ 2005   TRANSTHORACIC ECHOCARDIOGRAM  03/2010   SHOWED MILD REDUCTION OF LV FUNCTION   Current Outpatient Medications  Medication Sig Dispense Refill   acetaminophen  (TYLENOL ) 325 MG tablet Take 325 mg by mouth 2 (two) times daily as needed for moderate pain or headache.     amoxicillin (AMOXIL) 500 MG capsule SMARTSIG:4 Capsule(s) By Mouth     ARIPiprazole  (ABILIFY ) 10 MG tablet Take 5 mg by mouth at bedtime.     aspirin  EC 81 MG tablet Take 81 mg by mouth at bedtime.      atorvastatin  (LIPITOR) 40 MG tablet TAKE 1 TABLET BY MOUTH EVERY DAY 90 tablet 0   carvedilol  (COREG ) 6.25 MG tablet TAKE 1 TABLET BY MOUTH  TWICE A DAY WITH FOOD 180 tablet 3   clonazePAM  (KLONOPIN ) 1 MG tablet Take 1 mg by mouth as needed for anxiety.     docusate sodium  (COLACE) 100 MG capsule Take 100 mg by mouth at bedtime.     ENTRESTO  97-103 MG TAKE 1 TABLET BY MOUTH TWICE A DAY 180 tablet 1   FARXIGA  10 MG TABS tablet TAKE 1 TABLET BY MOUTH EVERY DAY 30 tablet 11   furosemide  (LASIX ) 20 MG tablet TAKE 1 TABLET BY MOUTH 4 TIMES A WEEK. 48 tablet 3   levothyroxine  (SYNTHROID ) 150 MCG tablet TAKE 1 TABLET BY MOUTH EVERY DAY 90 tablet 0   mexiletine (MEXITIL ) 150 MG capsule TAKE 2 CAPSULES BY MOUTH EVERY MORNING AND 2 CAPSULES BY MOUTH EVERY EVENING. 360 capsule 3   Multiple Vitamins-Minerals (AIRBORNE) CHEW Chew 1 tablet by mouth at bedtime.     ranolazine  (RANEXA ) 500 MG 12 hr tablet TAKE 1 TABLET BY MOUTH TWICE A DAY 180 tablet 1   sertraline  (ZOLOFT ) 50 MG tablet TAKE 1 TABLET BY MOUTH EVERY DAY 90 tablet 2   doxazosin  (CARDURA ) 1 MG tablet TAKE 1 TABLET (1 MG TOTAL) BY MOUTH DAILY. FOR SBP GREATER THAN 140 (Patient not taking: Reported on 11/30/2023) 90 tablet 0   No current facility-administered medications for this encounter.   Allergies:   Oxycodone , Iodinated contrast  media, and Rifampin    Social History:  The patient  reports that he has never smoked. He has never used smokeless tobacco. He reports that he does not drink alcohol and does not use drugs.   Family History:  The patient's family history includes Heart disease in his father; Hypertension in his father and mother; Kidney Stones in his sister; Stroke in his father; Thyroid  cancer in his sister and sister.   ROS:  Please see the history of present illness.   All other systems are personally reviewed and negative.   Recent Labs: 06/26/2023: B Natriuretic Peptide 329.2; BUN 16; Creatinine, Ser 1.35; Platelets 160 07/09/2023: Hemoglobin 15.3; Potassium 3.8; Sodium 143  Personally reviewed   Wt Readings from Last 3 Encounters:  11/30/23 89.1 kg (196 lb 6.4 oz)  09/26/23 87.7 kg (193 lb 6.4 oz)  07/09/23 84.8 kg (187 lb)    BP 120/78   Pulse 64   Wt 89.1 kg (196 lb 6.4 oz)   SpO2 95%   BMI 26.64 kg/m   Physical Exam General:  Walked into clinic No resp difficulty HEENT: normal Neck: supple. no JVD. Carotids 2+ bilat; no bruits. No lymphadenopathy or thryomegaly appreciated. Cor: PMI nondisplaced. Regular rate & rhythm. No rubs, gallops or murmurs. Lungs: clear Abdomen: soft, nontender, nondistended. No hepatosplenomegaly. No bruits or masses. Good bowel sounds. Extremities: no cyanosis, clubbing, rash, edema Neuro: alert & orientedx3, cranial nerves grossly intact. moves all 4 extremities w/o difficulty. Affect pleasant   Device interrogation (personally reviewed): No VT/AF. Volume ok.   ASSESSMENT AND PLAN:  1. Chronic Systolic HF  - NICM, s/p St Jude ICD - Echo (5/17): EF 25-30%, Mild MR, Mod LAE, RV mildly dilated, Mild RAE.  - Echo (12/21/16): EF 25-30%. IW AK RV mild-mod  - Echo (02/20/18): EF 30-35% AVR stable.  - Echo (2020): EF 25%  - CPX (09/2015) with moderate HF limitation.  - CPX (6/18) much improved. Peak VO2: 21.1 (73% predicted peak VO2) VE/VCO2 slope: 35 - CPX 10/24  Peak VO2: 13.8 (53% predicted peak VO2) VE/VCO2 slope:  35 Peak RER: 1.28 - Echo (9/21):  EF 35-40% - Echo (2022): EF 35-40% RV ok  - Echo (11/23): EF 25-30% AVR stable - Echo 06/16/23 EF 20-25% - Improved NYHA II - Volume status ok - Continue Lasix  20 mg 3x/week + PRN - Continue Entresto  97/103 mg bid - Continue Coreg  6.25 mg bid (bradycardic at higher doses) - Continue Farxiga  10 mg daily. - Not on spiro due to hyperkalemia - Overall improved. NYHA II. - If symptoms worsen could consider Barostim  2. VT/NICM - Quiescent on mexilitene/Ranexa  - ICD interrogated personally  3. HTN - BP ok  4. Valvular heart disease - s/p bentall in 2001 and re-do bentall 2015.  - AVR stable on echo - Aware of SBE prophylaxis  5. Depression - Stable.   6. Pre-operative CV risk stratification for hernia surgery - EF is low but functional capacity ok - moderate risk for peri-op CV complications - He is ok to proceed  Signed, Jules Oar, MD  11/30/2023 11:52 AM  Advanced Heart Failure Clinic [facility] [address] [facility] [place] [address] [phone] (office) [phone] (fax)'

## 2023-12-10 ENCOUNTER — Telehealth: Payer: Self-pay

## 2023-12-10 NOTE — Telephone Encounter (Addendum)
 Alert received from [employer] for ICD reaching ERI 12/07/23.   Discussed with patient wife battery needs replaced and will need apt to discuss. Requested to call number below for apts.   [phone].

## 2023-12-13 ENCOUNTER — Encounter: Payer: Self-pay | Admitting: Internal Medicine

## 2023-12-20 ENCOUNTER — Ambulatory Visit: Payer: Self-pay | Admitting: Cardiology

## 2023-12-20 ENCOUNTER — Ambulatory Visit (INDEPENDENT_AMBULATORY_CARE_PROVIDER_SITE_OTHER)

## 2023-12-20 DIAGNOSIS — I428 Other cardiomyopathies: Secondary | ICD-10-CM

## 2023-12-20 LAB — CUP PACEART REMOTE DEVICE CHECK
Battery Remaining Longevity: 0 mo
Battery Voltage: 2.59 V
Brady Statistic RV Percent Paced: 1 %
Date Time Interrogation Session: 20250522020017
HighPow Impedance: 68 Ohm
HighPow Impedance: 68 Ohm
Implantable Lead Connection Status: 753985
Implantable Lead Implant Date: 20160603
Implantable Lead Location: 753860
Implantable Lead Model: 7122
Implantable Pulse Generator Implant Date: 20160603
Lead Channel Impedance Value: 430 Ohm
Lead Channel Pacing Threshold Amplitude: 1.5 V
Lead Channel Pacing Threshold Pulse Width: 0.5 ms
Lead Channel Sensing Intrinsic Amplitude: 9.4 mV
Lead Channel Setting Pacing Amplitude: 2.5 V
Lead Channel Setting Pacing Pulse Width: 0.5 ms
Lead Channel Setting Sensing Sensitivity: 0.5 mV
Pulse Gen Serial Number: 7135169

## 2023-12-26 ENCOUNTER — Telehealth: Payer: Self-pay

## 2023-12-26 NOTE — Telephone Encounter (Signed)
 LM for pt to return my call to schedule date for ICD Gen Change with Dr. Lawana Pray.

## 2023-12-26 NOTE — Telephone Encounter (Signed)
 Spoke with Pt's Wife and scheduled his ICD Gen change with Dr. Lawana Pray on 7/14 at 2:30 pm.   He will have updated labs done after his visit with Brandi on 7/1... he will need to get scrub that day also.

## 2023-12-27 NOTE — Telephone Encounter (Signed)
 Ill defer that to [facility] and April depending on how soon they can get the patient on the books for gen change.

## 2023-12-27 NOTE — Telephone Encounter (Signed)
 Perfect. Apt is good then to keep.

## 2023-12-31 ENCOUNTER — Telehealth: Payer: Self-pay

## 2023-12-31 ENCOUNTER — Ambulatory Visit: Attending: Cardiology

## 2023-12-31 DIAGNOSIS — I5022 Chronic systolic (congestive) heart failure: Secondary | ICD-10-CM | POA: Diagnosis not present

## 2023-12-31 DIAGNOSIS — Z95810 Presence of automatic (implantable) cardiac defibrillator: Secondary | ICD-10-CM

## 2023-12-31 NOTE — Progress Notes (Signed)
 EPIC Encounter for ICM Monitoring  Patient Name: Bradley [name] is a 68 y.o. male Date: 12/31/2023 Primary Care Physican: Marquetta Sit, MD Primary Cardiologist: Bensimhon Electrophysiologist: Lawana Pray Nephrologist:  Deterding 08/23/2022 Weight: 198.2 lbs 09/26/2023 Office Weight: 193 lbs 11/30/2023 Office Weight: 196 lbs   Battery Longevity:  Device replacement scheduled 7/14                                          Attempted call to patient and unable to reach.   Transmission results reviewed.    CorVue thoracic impedance suggesting possible fluid accumulation starting 5/27.   Prescribed:  Furosemide  20 mg Take 1 tablet (20 mg total) by mouth 4 (four) times a week.   Per Dr Andria Keeler 11/30/2023 OV note, continue Lasix  20 mg 3x/week + PRN       Labs: 06/26/2023 Creatinine 1.35, BUN 16, Potassium 4.7, Sodium 141, GFR 58 03/18/2023 Creatinine 1.09, BUN 13, Potassium 4.5, Sodium 139, GFR >60 11/17/2022 Creatinine 1.33, BUN 17, Potassium 4.6, Sodium 141 10/23/2022 Creatinine 1.20, BUN 16, Potassium 4.9, Sodium 141, GFR >60 A complete set of results can be found in Results Review.   Recommendations:  Unable to reach.   Will send copy to HF clinic for review if patient is reached.   Follow-up plan: ICM clinic phone appointment on 01/07/2024 to recheck fluid levels.   91 day device clinic remote transmission 03/24/2024.     EP/Cardiology Office Visits: 01/29/2024 with Creighton Doffing, NP.  Recall 04/01/2024 with Dr Julane Ny.     Copy of ICM check sent to Dr. Lawana Pray.  3 month ICM trend: 12/31/2023.    12-14 Month ICM trend:     Almyra Jain, RN 12/31/2023 11:35 AM

## 2023-12-31 NOTE — Telephone Encounter (Signed)
 Remote ICM transmission received.  Attempted call to patient regarding ICM remote transmission and no answer.

## 2024-01-01 ENCOUNTER — Telehealth: Payer: Self-pay

## 2024-01-01 NOTE — Progress Notes (Signed)
 Attempted call to patient and unable to reach.  Left detailed message per DPR regarding transmission.  Transmission results reviewed.

## 2024-01-01 NOTE — Telephone Encounter (Signed)
 Remote ICM transmission received.  Attempted call to patient regarding ICM remote transmission and left detailed message per DPR.  Left ICM phone number and advised to return call for any fluid symptoms or questions. Next ICM remote transmission scheduled 01/07/2024.

## 2024-01-02 ENCOUNTER — Other Ambulatory Visit: Payer: Self-pay | Admitting: Family Medicine

## 2024-01-04 ENCOUNTER — Other Ambulatory Visit (HOSPITAL_COMMUNITY): Payer: Self-pay | Admitting: Internal Medicine

## 2024-01-04 NOTE — Addendum Note (Signed)
 Addended by: Edra Govern D on: 01/04/2024 10:35 AM   Modules accepted: Orders, Level of Service

## 2024-01-04 NOTE — Progress Notes (Signed)
 Remote ICD transmission.

## 2024-01-07 ENCOUNTER — Ambulatory Visit: Attending: Cardiology

## 2024-01-07 DIAGNOSIS — Z95810 Presence of automatic (implantable) cardiac defibrillator: Secondary | ICD-10-CM

## 2024-01-07 DIAGNOSIS — I5022 Chronic systolic (congestive) heart failure: Secondary | ICD-10-CM

## 2024-01-08 ENCOUNTER — Ambulatory Visit: Admitting: Family Medicine

## 2024-01-09 ENCOUNTER — Telehealth: Payer: Self-pay

## 2024-01-09 NOTE — Telephone Encounter (Signed)
Remote ICM transmission received.  Attempted call to patient regarding ICM remote transmission and left detailed message per DPR.  Left ICM phone number and advised to return call for any fluid symptoms or questions.

## 2024-01-09 NOTE — Progress Notes (Signed)
 EPIC Encounter for ICM Monitoring  Patient Name: Bradley [name] is a 68 y.o. male Date: 01/09/2024 Primary Care Physican: Marquetta Sit, MD Primary Cardiologist: Bensimhon Electrophysiologist: Lawana Pray Nephrologist:  Deterding 08/23/2022 Weight: 198.2 lbs 09/26/2023 Office Weight: 193 lbs 11/30/2023 Office Weight: 196 lbs   Battery Longevity:  Device replacement scheduled 7/14                                          Attempted call to patient and unable to reach.  Left detailed message per DPR regarding transmission.  Transmission results reviewed.    CorVue thoracic impedance suggesting possible fluid accumulation starting 5/27 and returned close to baseline 6/9.   Prescribed:  Furosemide  20 mg Take 1 tablet (20 mg total) by mouth 4 (four) times a week.   Per Dr Andria Keeler 11/30/2023 OV note, continue Lasix  20 mg 3x/week + PRN       Labs: 06/26/2023 Creatinine 1.35, BUN 16, Potassium 4.7, Sodium 141, GFR 58 03/18/2023 Creatinine 1.09, BUN 13, Potassium 4.5, Sodium 139, GFR >60 11/17/2022 Creatinine 1.33, BUN 17, Potassium 4.6, Sodium 141 10/23/2022 Creatinine 1.20, BUN 16, Potassium 4.9, Sodium 141, GFR >60 A complete set of results can be found in Results Review.   Recommendations: Left voice mail with ICM number and encouraged to call if experiencing any fluid symptoms.   Follow-up plan: ICM clinic phone appointment on 02/25/2024.   91 day device clinic remote transmission 03/24/2024.     EP/Cardiology Office Visits: 01/29/2024 with Creighton Doffing, NP.  Recall 04/01/2024 with Dr Julane Ny.     Copy of ICM check sent to Dr. Lawana Pray.  3 month ICM trend: 01/07/2024.    12-14 Month ICM trend:     Almyra Jain, RN 01/09/2024 8:11 AM

## 2024-01-11 ENCOUNTER — Other Ambulatory Visit: Payer: Self-pay | Admitting: Internal Medicine

## 2024-01-11 DIAGNOSIS — I10 Essential (primary) hypertension: Secondary | ICD-10-CM

## 2024-01-15 ENCOUNTER — Ambulatory Visit: Admitting: Family Medicine

## 2024-01-21 ENCOUNTER — Ambulatory Visit

## 2024-01-21 DIAGNOSIS — I5022 Chronic systolic (congestive) heart failure: Secondary | ICD-10-CM

## 2024-01-21 DIAGNOSIS — I428 Other cardiomyopathies: Secondary | ICD-10-CM

## 2024-01-21 LAB — CUP PACEART REMOTE DEVICE CHECK
Battery Remaining Longevity: 0 mo
Battery Voltage: 2.59 V
Brady Statistic RV Percent Paced: 1 %
Date Time Interrogation Session: 20250623020017
HighPow Impedance: 73 Ohm
HighPow Impedance: 73 Ohm
Implantable Lead Connection Status: 753985
Implantable Lead Implant Date: 20160603
Implantable Lead Location: 753860
Implantable Lead Model: 7122
Implantable Pulse Generator Implant Date: 20160603
Lead Channel Impedance Value: 410 Ohm
Lead Channel Pacing Threshold Amplitude: 1.5 V
Lead Channel Pacing Threshold Pulse Width: 0.5 ms
Lead Channel Sensing Intrinsic Amplitude: 8.1 mV
Lead Channel Setting Pacing Amplitude: 2.5 V
Lead Channel Setting Pacing Pulse Width: 0.5 ms
Lead Channel Setting Sensing Sensitivity: 0.5 mV
Pulse Gen Serial Number: 7135169

## 2024-01-26 ENCOUNTER — Other Ambulatory Visit: Payer: Self-pay | Admitting: Family Medicine

## 2024-01-28 NOTE — H&P (View-Only) (Signed)
 Electrophysiology Office Note:   Date:  01/29/2024  ID:  SIAN JOLES, DOB [birth year], MRN [id number]  Primary Cardiologist: Vina Gull, MD Primary Heart Failure: Toribio Fuel, MD Electrophysiologist: Soyla Gladis Norton, MD       History of Present Illness:   Bradley [name] is a 68 y.o. male with h/o HFrEF / NICM with VT s/p ICD, congenital AS s/p balloon valvotomy at age 25 / AV repair 1968, HTN, CKD II, anxiety seen today for routine electrophysiology followup.   Alert received by [employer] that the patient reached ERI as of 12/07/23.    Since last being seen in our clinic the patient reports doing well overall. No acute concerns about device change.    He denies chest pain, palpitations, dyspnea, PND, orthopnea, nausea, vomiting, dizziness, syncope, edema, weight gain, or early satiety.   Review of systems complete and found to be negative unless listed in HPI.   EP Information / Studies Reviewed:    EKG is not ordered today. EKG from 12/13/23 reviewed which showed SR with 1st degree AVB, RBBB      ICD Interrogation-  reviewed in detail today,  See PACEART report.  Device History: Abbott Single Chamber ICD implanted 01/01/15 for NICM History of appropriate therapy: No History of AAD therapy: Amiodarone  (stopped on decision, not intolerant) > switched to mexiletine + ranolazine      Studies:  ECHO 06/2023 > LVEF 20-25%, G1DD, LA / RA mildly dilated R/LHC 07/2023 > primarily NICM with normal left coronary system, apparent high-grade narrowing at right coronary button with adequate flow down RCA, low filling pressures with mild to mod reduced C.O.         Physical Exam:   VS:  BP (!) 144/79   Pulse (!) 55   Ht 6' (1.829 m)   Wt 195 lb 6.4 oz (88.6 kg)   SpO2 99%   BMI 26.50 kg/m    Wt Readings from Last 3 Encounters:  01/29/24 195 lb 6.4 oz (88.6 kg)  11/30/23 196 lb 6.4 oz (89.1 kg)  09/26/23 193 lb 6.4 oz (87.7 kg)     GEN: Well nourished, well  developed in no acute distress NECK: No JVD; No carotid bruits CARDIAC: Regular rate and rhythm, no murmurs, rubs, gallops RESPIRATORY:  Clear to auscultation without rales, wheezing or rhonchi  ABDOMEN: Soft, non-tender, non-distended EXTREMITIES:  No edema; No deformity   ASSESSMENT AND PLAN:    Chronic Systolic Dysfunction due to NICM, MMVT s/p Abbott single chamber ICD  -euvolemic on exam / device  -Stable on an appropriate medical regimen -Normal ICD function > at ERI as of 12/07/23, planned for generator change on 02/11/24 with Dr. Norton -elevated / chronic RV threshold 1.5V - 1.75 V -See Pace Art report -No changes today -mexiletine 300 mg BID -ranolazine  500 mg BID  -Coreg  6.25 mg  -GDMT per AHF Team  -at [facility] as of 12/07/23, pre-procedure labs, letters / instructions, soap given to patient   Congenital AV Stenosis s/p Valvotomy, Bentall 2001 with tissue valve, redo Bentall 2015  Hx MSSA bacteremia and bacterial endocarditis in 08/2013 complicated by renal failure requiring dialysis for a short period of time.  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   -valve stable on most recent ECHO     Disposition:   Follow up with Dr. Norton on 02/11/24 for scheduled ICD generator change  Signed, Daphne Barrack, NP-C, AGACNP-BC [facility] - Electrophysiology  01/29/2024, 12:42 PM

## 2024-01-28 NOTE — Progress Notes (Unsigned)
 Electrophysiology Office Note:   Date:  01/29/2024  ID:  SIAN JOLES, DOB [birth year], MRN [id number]  Primary Cardiologist: Vina Gull, MD Primary Heart Failure: Toribio Fuel, MD Electrophysiologist: Soyla Gladis Norton, MD       History of Present Illness:   Bradley [name] is a 68 y.o. male with h/o HFrEF / NICM with VT s/p ICD, congenital AS s/p balloon valvotomy at age 25 / AV repair 1968, HTN, CKD II, anxiety seen today for routine electrophysiology followup.   Alert received by [employer] that the patient reached ERI as of 12/07/23.    Since last being seen in our clinic the patient reports doing well overall. No acute concerns about device change.    He denies chest pain, palpitations, dyspnea, PND, orthopnea, nausea, vomiting, dizziness, syncope, edema, weight gain, or early satiety.   Review of systems complete and found to be negative unless listed in HPI.   EP Information / Studies Reviewed:    EKG is not ordered today. EKG from 12/13/23 reviewed which showed SR with 1st degree AVB, RBBB      ICD Interrogation-  reviewed in detail today,  See PACEART report.  Device History: Abbott Single Chamber ICD implanted 01/01/15 for NICM History of appropriate therapy: No History of AAD therapy: Amiodarone  (stopped on decision, not intolerant) > switched to mexiletine + ranolazine      Studies:  ECHO 06/2023 > LVEF 20-25%, G1DD, LA / RA mildly dilated R/LHC 07/2023 > primarily NICM with normal left coronary system, apparent high-grade narrowing at right coronary button with adequate flow down RCA, low filling pressures with mild to mod reduced C.O.         Physical Exam:   VS:  BP (!) 144/79   Pulse (!) 55   Ht 6' (1.829 m)   Wt 195 lb 6.4 oz (88.6 kg)   SpO2 99%   BMI 26.50 kg/m    Wt Readings from Last 3 Encounters:  01/29/24 195 lb 6.4 oz (88.6 kg)  11/30/23 196 lb 6.4 oz (89.1 kg)  09/26/23 193 lb 6.4 oz (87.7 kg)     GEN: Well nourished, well  developed in no acute distress NECK: No JVD; No carotid bruits CARDIAC: Regular rate and rhythm, no murmurs, rubs, gallops RESPIRATORY:  Clear to auscultation without rales, wheezing or rhonchi  ABDOMEN: Soft, non-tender, non-distended EXTREMITIES:  No edema; No deformity   ASSESSMENT AND PLAN:    Chronic Systolic Dysfunction due to NICM, MMVT s/p Abbott single chamber ICD  -euvolemic on exam / device  -Stable on an appropriate medical regimen -Normal ICD function > at ERI as of 12/07/23, planned for generator change on 02/11/24 with Dr. Norton -elevated / chronic RV threshold 1.5V - 1.75 V -See Pace Art report -No changes today -mexiletine 300 mg BID -ranolazine  500 mg BID  -Coreg  6.25 mg  -GDMT per AHF Team  -at [facility] as of 12/07/23, pre-procedure labs, letters / instructions, soap given to patient   Congenital AV Stenosis s/p Valvotomy, Bentall 2001 with tissue valve, redo Bentall 2015  Hx MSSA bacteremia and bacterial endocarditis in 08/2013 complicated by renal failure requiring dialysis for a short period of time.  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   -valve stable on most recent ECHO     Disposition:   Follow up with Dr. Norton on 02/11/24 for scheduled ICD generator change  Signed, Daphne Barrack, NP-C, AGACNP-BC [facility] - Electrophysiology  01/29/2024, 12:42 PM

## 2024-01-29 ENCOUNTER — Ambulatory Visit: Attending: Cardiology | Admitting: Pulmonary Disease

## 2024-01-29 ENCOUNTER — Encounter: Payer: Self-pay | Admitting: Pulmonary Disease

## 2024-01-29 VITALS — BP 144/79 | HR 55 | Ht 72.0 in | Wt 195.4 lb

## 2024-01-29 DIAGNOSIS — I10 Essential (primary) hypertension: Secondary | ICD-10-CM | POA: Diagnosis not present

## 2024-01-29 DIAGNOSIS — I359 Nonrheumatic aortic valve disorder, unspecified: Secondary | ICD-10-CM

## 2024-01-29 DIAGNOSIS — I5022 Chronic systolic (congestive) heart failure: Secondary | ICD-10-CM

## 2024-01-29 DIAGNOSIS — I472 Ventricular tachycardia, unspecified: Secondary | ICD-10-CM

## 2024-01-29 DIAGNOSIS — Z95810 Presence of automatic (implantable) cardiac defibrillator: Secondary | ICD-10-CM

## 2024-01-29 LAB — CUP PACEART INCLINIC DEVICE CHECK
Date Time Interrogation Session: 20250701123756
Implantable Lead Connection Status: 753985
Implantable Lead Implant Date: 20160603
Implantable Lead Location: 753860
Implantable Lead Model: 7122
Implantable Pulse Generator Implant Date: 20160603
Pulse Gen Serial Number: 7135169

## 2024-01-29 NOTE — Patient Instructions (Signed)
 Medication Instructions:  Your physician recommends that you continue on your current medications as directed. Please refer to the Current Medication list given to you today.  *If you need a refill on your cardiac medications before your next appointment, please call your pharmacy*  Lab Work: BMET, CBC-TODAY If you have labs (blood work) drawn today and your tests are completely normal, you will receive your results only by: MyChart Message (if you have MyChart) OR A paper copy in the mail If you have any lab test that is abnormal or we need to change your treatment, we will call you to review the results.  Testing/Procedures: See letter  Follow-Up: At [facility], you and your health needs are our priority.  As part of our continuing mission to provide you with exceptional heart care, our providers are all part of one team.  This team includes your primary Cardiologist (physician) and Advanced Practice Providers or APPs (Physician Assistants and Nurse Practitioners) who all work together to provide you with the care you need, when you need it.  Your next appointment:   Follow up will be arranged for you and print out on your discharge summary after your procedure.

## 2024-01-29 NOTE — Progress Notes (Signed)
 Remote ICD transmission.

## 2024-01-30 ENCOUNTER — Ambulatory Visit: Payer: Self-pay | Admitting: Pulmonary Disease

## 2024-01-30 LAB — BASIC METABOLIC PANEL WITH GFR
BUN/Creatinine Ratio: 18 (ref 10–24)
BUN: 22 mg/dL (ref 8–27)
CO2: 24 mmol/L (ref 20–29)
Calcium: 9.8 mg/dL (ref 8.6–10.2)
Chloride: 105 mmol/L (ref 96–106)
Creatinine, Ser: 1.2 mg/dL (ref 0.76–1.27)
Glucose: 86 mg/dL (ref 70–99)
Potassium: 5.1 mmol/L (ref 3.5–5.2)
Sodium: 144 mmol/L (ref 134–144)
eGFR: 67 mL/min/{1.73_m2} (ref >59)

## 2024-01-30 LAB — CBC
Hematocrit: 51.4 % — ABNORMAL HIGH (ref 37.5–51.0)
Hemoglobin: 16.5 g/dL (ref 13.0–17.7)
MCH: 31.4 pg (ref 26.6–33.0)
MCHC: 32.1 g/dL (ref 31.5–35.7)
MCV: 98 fL — ABNORMAL HIGH (ref 79–97)
Platelets: 145 10*3/uL — ABNORMAL LOW (ref 150–450)
RBC: 5.25 x10E6/uL (ref 4.14–5.80)
RDW: 12.8 % (ref 11.6–15.4)
WBC: 6.3 x10E3/uL (ref 3.4–10.8)

## 2024-02-02 ENCOUNTER — Other Ambulatory Visit: Payer: Self-pay | Admitting: Family Medicine

## 2024-02-02 ENCOUNTER — Other Ambulatory Visit: Payer: Self-pay | Admitting: Internal Medicine

## 2024-02-04 ENCOUNTER — Telehealth (HOSPITAL_COMMUNITY): Payer: Self-pay

## 2024-02-04 NOTE — Telephone Encounter (Signed)
 Spoke with patient's wife Hadassah to discuss upcoming procedure/DPR on file.    Confirmed patient is scheduled for a ICD generator change on Monday, July 14 with Dr. Soyla Norton. Instructed patient to arrive at the Main Entrance A at [facility]: [address] [place], [place] [address] and check in at Admitting at 12:30 PM.   Labs completed  Any recent signs of acute illness or been started on antibiotics? No Any new medications started? No Any medications to hold? Hold Farxiga  for 3 days prior to the procedure- last dose on July 10. Hold Lasix  and Aspirin  on the morning of your procedure.  Medication instructions:  On the morning of your procedure you may take all other medications not discussed with a small amount of water .  No eating or drinking after midnight prior to procedure.   The night before your procedure and the morning of your procedure, wash thoroughly with the CHG surgical soap from the neck down, paying special attention to the area where your procedure will be performed.  Advised of plan to go home the same day and will only stay overnight if medically necessary. You MUST have a responsible adult to drive you home and MUST be with you the first 24 hours after you arrive home.  Hadassah verbalized understanding to all instructions provided and agreed to proceed with procedure.

## 2024-02-07 ENCOUNTER — Telehealth: Payer: Self-pay

## 2024-02-07 NOTE — Telephone Encounter (Signed)
 LM on Pt's VM informing him of time change of his procedure on Monday 7/14 with Dr. Inocencio. He will need to arrive at 10:00 am at [facility].

## 2024-02-08 NOTE — Pre-Procedure Instructions (Signed)
 Attempted to call patient regarding procedure instructions.  Left voicemail on the following items: Arrival time 1000 Nothing to eat or drink after midnight No meds AM of procedure Responsible person to drive you home and stay with you for 24 hrs Wash with special soap night before and morning of procedure

## 2024-02-08 NOTE — Telephone Encounter (Signed)
 Pt's Wife called to confirm she received the message below.

## 2024-02-11 ENCOUNTER — Ambulatory Visit (HOSPITAL_COMMUNITY)
Admission: RE | Admit: 2024-02-11 | Discharge: 2024-02-11 | Disposition: A | Discharge status: Home / Self Care | Attending: Cardiology | Admitting: Cardiology

## 2024-02-11 ENCOUNTER — Other Ambulatory Visit: Payer: Self-pay

## 2024-02-11 ENCOUNTER — Ambulatory Visit (HOSPITAL_COMMUNITY)
Admission: RE | Disposition: A | Discharge status: Home / Self Care | Payer: Self-pay | Source: Home / Self Care | Attending: Cardiology

## 2024-02-11 DIAGNOSIS — I428 Other cardiomyopathies: Secondary | ICD-10-CM | POA: Insufficient documentation

## 2024-02-11 DIAGNOSIS — N182 Chronic kidney disease, stage 2 (mild): Secondary | ICD-10-CM | POA: Diagnosis not present

## 2024-02-11 DIAGNOSIS — F419 Anxiety disorder, unspecified: Secondary | ICD-10-CM | POA: Insufficient documentation

## 2024-02-11 DIAGNOSIS — Z4502 Encounter for adjustment and management of automatic implantable cardiac defibrillator: Secondary | ICD-10-CM

## 2024-02-11 DIAGNOSIS — I5022 Chronic systolic (congestive) heart failure: Secondary | ICD-10-CM | POA: Diagnosis not present

## 2024-02-11 DIAGNOSIS — I130 Hypertensive heart and chronic kidney disease with heart failure and stage 1 through stage 4 chronic kidney disease, or unspecified chronic kidney disease: Secondary | ICD-10-CM | POA: Insufficient documentation

## 2024-02-11 DIAGNOSIS — Z79899 Other long term (current) drug therapy: Secondary | ICD-10-CM | POA: Diagnosis not present

## 2024-02-11 HISTORY — PX: ICD GENERATOR CHANGEOUT: EP1231

## 2024-02-11 SURGERY — ICD GENERATOR CHANGEOUT

## 2024-02-11 MED ORDER — MIDAZOLAM HCL 5 MG/5ML IJ SOLN
INTRAMUSCULAR | Status: DC | PRN
  Administered 2024-02-11: 1 mg via INTRAVENOUS

## 2024-02-11 MED ORDER — POVIDONE-IODINE 10 % EX SWAB
2.0000 | Freq: Once | CUTANEOUS | Status: AC
  Administered 2024-02-11: 2 via TOPICAL

## 2024-02-11 MED ORDER — ONDANSETRON HCL 4 MG/2ML IJ SOLN: 4.0000 mg | Freq: Four times a day (QID) | INTRAMUSCULAR | Status: DC | PRN

## 2024-02-11 MED ORDER — FENTANYL CITRATE (PF) 100 MCG/2ML IJ SOLN
INTRAMUSCULAR | Status: DC | PRN
  Administered 2024-02-11: 25 ug via INTRAVENOUS

## 2024-02-11 MED ORDER — CEFAZOLIN SODIUM-DEXTROSE 2-4 GM/100ML-% IV SOLN
INTRAVENOUS | Status: AC
  Administered 2024-02-11: 2 g via INTRAVENOUS
  Filled 2024-02-11: qty 100

## 2024-02-11 MED ORDER — MIDAZOLAM HCL 2 MG/2ML IJ SOLN
INTRAMUSCULAR | Status: AC
  Filled 2024-02-11: qty 2

## 2024-02-11 MED ORDER — ACETAMINOPHEN 325 MG PO TABS: 325.0000 mg | ORAL_TABLET | ORAL | Status: DC | PRN

## 2024-02-11 MED ORDER — LIDOCAINE HCL (PF) 1 % IJ SOLN
INTRAMUSCULAR | Status: AC
Start: 2024-02-11 — End: 2024-02-11
  Filled 2024-02-11: qty 60

## 2024-02-11 MED ORDER — CEFAZOLIN SODIUM-DEXTROSE 2-4 GM/100ML-% IV SOLN: 2.0000 g | INTRAVENOUS | Status: AC

## 2024-02-11 MED ORDER — SODIUM CHLORIDE 0.9 % IV SOLN
INTRAVENOUS | Status: AC
  Administered 2024-02-11: 80 mg
  Filled 2024-02-11: qty 2

## 2024-02-11 MED ORDER — LIDOCAINE HCL (PF) 1 % IJ SOLN
INTRAMUSCULAR | Status: DC | PRN
  Administered 2024-02-11: 50 mL

## 2024-02-11 MED ORDER — CHLORHEXIDINE GLUCONATE 4 % EX SOLN
4.0000 | Freq: Once | CUTANEOUS | Status: DC
  Filled 2024-02-11: qty 60

## 2024-02-11 MED ORDER — SODIUM CHLORIDE 0.9 % IV SOLN: INTRAVENOUS | Status: DC

## 2024-02-11 MED ORDER — GENTAMICIN SULFATE 40 MG/ML IJ SOLN: 80.0000 mg | INTRAMUSCULAR | Status: AC

## 2024-02-11 MED ORDER — FENTANYL CITRATE (PF) 100 MCG/2ML IJ SOLN
INTRAMUSCULAR | Status: AC
  Filled 2024-02-11: qty 2

## 2024-02-11 MED ORDER — SODIUM CHLORIDE 0.9 % IV SOLN
INTRAVENOUS | Status: DC | PRN
  Administered 2024-02-11: 350 mL via INTRAVENOUS

## 2024-02-11 SURGICAL SUPPLY — 5 items
CABLE SURGICAL S-101-97-12 (CABLE) ×1 IMPLANT
ELECT DEFIB PAD ADLT CADENCE (PAD) IMPLANT
ICD GALLANT VR DF1 CDVRA500T (ICD Generator) IMPLANT
POUCH AIGIS-R ANTIBACT ICD LRG (Mesh General) IMPLANT
TRAY PACEMAKER INSERTION (PACKS) ×1 IMPLANT

## 2024-02-11 NOTE — Discharge Instructions (Signed)

## 2024-02-11 NOTE — Interval H&P Note (Signed)
 History and Physical Interval Note:  02/11/2024 11:07 AM  Bradley [name].  has presented today for surgery, with the diagnosis of eri.  The various methods of treatment have been discussed with the patient and family. After consideration of risks, benefits and other options for treatment, the patient has consented to  Procedure(s): ICD GENERATOR CHANGEOUT (N/A) as a surgical intervention.  The patient's history has been reviewed, patient examined, no change in status, stable for surgery.  I have reviewed the patient's chart and labs.  Questions were answered to the patient's satisfaction.     Zaara Sprowl [employer]

## 2024-02-12 ENCOUNTER — Encounter (HOSPITAL_COMMUNITY): Payer: Self-pay | Admitting: Cardiology

## 2024-02-13 MED FILL — Midazolam HCl Inj 2 MG/2ML (Base Equivalent): INTRAMUSCULAR | Qty: 1 | Status: AC

## 2024-02-15 ENCOUNTER — Telehealth: Payer: Self-pay

## 2024-02-15 NOTE — Telephone Encounter (Signed)
 Follow-up after same day discharge: Implant date: 02/11/2024 MD: Soyla Norton, MD Device: Abbott ICD  Location: Left Chest   Wound check visit: 02/26/2024 90 day MD follow-up: 05/16/2024  Remote Transmission received:02/13/2024  Dressing/sling removed: removed  Confirm OAC restart on: Pt not on an OAC  Please continue to monitor your cardiac device site for redness, swelling, and drainage. Call the device clinic at [phone] if you experience these symptoms, fever/chills, or have questions about your device.   Remote monitoring is used to monitor your cardiac device from home. This monitoring is scheduled every 91 days by our office. It allows us  to keep an eye on the functioning of your device to ensure it is working properly.  Pt is overall doing well.

## 2024-02-20 ENCOUNTER — Other Ambulatory Visit: Payer: Self-pay | Admitting: Family Medicine

## 2024-02-21 ENCOUNTER — Encounter

## 2024-02-22 NOTE — Progress Notes (Signed)
 Remote ICD transmission.

## 2024-02-25 ENCOUNTER — Encounter

## 2024-02-26 ENCOUNTER — Ambulatory Visit: Attending: Internal Medicine

## 2024-02-26 DIAGNOSIS — I428 Other cardiomyopathies: Secondary | ICD-10-CM

## 2024-02-26 LAB — CUP PACEART INCLINIC DEVICE CHECK
Battery Remaining Longevity: 121 mo
Brady Statistic RV Percent Paced: 0.17 %
Date Time Interrogation Session: 20250729141358
HighPow Impedance: 59.625
Implantable Lead Connection Status: 753985
Implantable Lead Implant Date: 20160603
Implantable Lead Location: 753860
Implantable Lead Model: 7122
Implantable Pulse Generator Implant Date: 20250714
Lead Channel Impedance Value: 387.5 Ohm
Lead Channel Pacing Threshold Amplitude: 1.25 V
Lead Channel Pacing Threshold Amplitude: 1.25 V
Lead Channel Pacing Threshold Pulse Width: 1 ms
Lead Channel Pacing Threshold Pulse Width: 1 ms
Lead Channel Sensing Intrinsic Amplitude: 7.4 mV
Lead Channel Setting Pacing Amplitude: 3 V
Lead Channel Setting Pacing Pulse Width: 1 ms
Lead Channel Setting Sensing Sensitivity: 0.5 mV
Pulse Gen Serial Number: 111074227

## 2024-02-26 NOTE — Patient Instructions (Signed)
   After Your ICD (Implantable Cardiac Defibrillator)    Monitor your defibrillator site for redness, swelling, and drainage. Call the device clinic at [phone] if you experience these symptoms or fever/chills.  Your incision was closed with Steri-strips or staples:  You may shower 7 days after your procedure and wash your incision with soap and water. Avoid lotions, ointments, or perfumes over your incision until it is well-healed.  You may use a hot tub or a pool after your wound check appointment if the incision is completely closed.   There are no other restrictions in arm movement after your wound check appointment.  Your ICD is designed to protect you from life threatening heart rhythms. Because of this, you may receive a shock.   1 shock with no symptoms:  Call the office during business hours. 1 shock with symptoms (chest pain, chest pressure, dizziness, lightheadedness, shortness of breath, overall feeling unwell):  Call 911. If you experience 2 or more shocks in 24 hours:  Call 911. If you receive a shock, you should not drive.  [place] DMV - no driving for 6 months if you receive appropriate therapy from your ICD.   ICD Alerts:  Some alerts are vibratory and others beep. These are NOT emergencies. Please call our office to let us know. If this occurs at night or on weekends, it can wait until the next business day. Send a remote transmission.  If your device is capable of reading fluid status (for heart failure), you will be offered monthly monitoring to review this with you.   Remote monitoring is used to monitor your ICD from home. This monitoring is scheduled every 91 days by our office. It allows [place] to keep an eye on the functioning of your device to ensure it is working properly. You will routinely see your Electrophysiologist annually (more often if necessary).

## 2024-02-26 NOTE — Progress Notes (Signed)
 Normal single chamber ICD wound check. Wound well healed. Presenting rhythm: VS 64 . Routine testing performed. Thresholds, sensing, and impedances consistent with implant measurements. No treated arrhythmias. Reviewed shock plan.  Pt enrolled in remote follow-up.

## 2024-02-26 NOTE — Progress Notes (Signed)
 Device battery replaced 7/14.  ICM remote transmission rescheduled to allow Thoracic impedance fluid level baseline to develop.

## 2024-02-27 ENCOUNTER — Ambulatory Visit: Payer: Self-pay | Admitting: Cardiology

## 2024-03-07 ENCOUNTER — Telehealth: Payer: Self-pay

## 2024-03-07 MED ORDER — ARIPIPRAZOLE 5 MG PO TABS: ORAL_TABLET | ORAL | 0 refills | Status: DC

## 2024-03-07 NOTE — Telephone Encounter (Signed)
 Copied from CRM #[id number]. Topic: Clinical - Medication Question >> Mar 07, 2024 10:00 AM Taleah C wrote: Reason for CRM: pt's wife called in for refills on Aripiprazole  but the medication has been discontinued. She explained that she has been splitting the tablets in half so that's why it's been lasting for this long. Please call and advise if medication can be resumed.

## 2024-03-07 NOTE — Telephone Encounter (Signed)
 Patient wife informed and rx sent

## 2024-03-07 NOTE — Addendum Note (Signed)
 Addended by: METTA KRISTEN CROME on: 03/07/2024 01:16 PM   Modules accepted: Orders

## 2024-03-12 ENCOUNTER — Other Ambulatory Visit: Payer: Self-pay | Admitting: Family Medicine

## 2024-03-19 ENCOUNTER — Other Ambulatory Visit: Payer: Self-pay | Admitting: Family Medicine

## 2024-03-19 ENCOUNTER — Ambulatory Visit: Attending: Cardiology

## 2024-03-19 DIAGNOSIS — Z95810 Presence of automatic (implantable) cardiac defibrillator: Secondary | ICD-10-CM | POA: Diagnosis not present

## 2024-03-19 DIAGNOSIS — I5022 Chronic systolic (congestive) heart failure: Secondary | ICD-10-CM | POA: Diagnosis not present

## 2024-03-21 ENCOUNTER — Telehealth: Payer: Self-pay

## 2024-03-21 NOTE — Telephone Encounter (Signed)
 Remote ICM transmission received.  Attempted call to patient regarding ICM remote transmission and left detailed message per DPR.  Left ICM phone number and advised to return call for any fluid symptoms or questions. Next ICM remote transmission scheduled 04/21/2024.

## 2024-03-21 NOTE — Progress Notes (Signed)
 EPIC Encounter for ICM Monitoring  Patient Name: Bradley [name]. is a 68 y.o. male Date: 03/21/2024 Primary Care Physican: Micheal Wolm ORN, MD Primary Cardiologist: Bensimhon Electrophysiologist: Inocencio Nephrologist:  Deterding 08/23/2022 Weight: 198.2 lbs 09/26/2023 Office Weight: 193 lbs 11/30/2023 Office Weight: 196 lbs                                        Attempted call to patient and unable to reach.  Left detailed message per DPR regarding transmission.  Transmission results reviewed.     CorVue thoracic impedance suggesting possible dryness starting 8/14 but trending back toward baseline.   Prescribed:  Furosemide  20 mg Take 1 tablet (20 mg total) by mouth 4 (four) times a week.   Per Dr Nelle 11/30/2023 OV note, continue Lasix  20 mg 3x/week + PRN       Labs: 01/29/2024 Creatinine 1.20, BUN 22, Potassium 5.1, Sodium 144, GFR 67  A complete set of results can be found in Results Review.   Recommendations: Left voice mail with ICM number and encouraged to call if experiencing any fluid symptoms.   Follow-up plan: ICM clinic phone appointment on 04/21/2024.   91 day device clinic remote transmission 06/24/2024.     EP/Cardiology Office Visits: 05/16/2024 with Dr Inocencio.  Recall 04/01/2024 with Dr Cherrie.     Copy of ICM check sent to Dr. Inocencio.  3 month ICM trend: 03/19/2024.    12-14 Month ICM trend:     Mitzie GORMAN Garner, RN 03/21/2024 7:41 AM

## 2024-03-22 ENCOUNTER — Other Ambulatory Visit: Payer: Self-pay | Admitting: Internal Medicine

## 2024-03-24 ENCOUNTER — Encounter

## 2024-03-25 ENCOUNTER — Ambulatory Visit (INDEPENDENT_AMBULATORY_CARE_PROVIDER_SITE_OTHER)

## 2024-03-25 DIAGNOSIS — I428 Other cardiomyopathies: Secondary | ICD-10-CM | POA: Diagnosis not present

## 2024-03-26 ENCOUNTER — Ambulatory Visit: Payer: Self-pay | Admitting: Cardiology

## 2024-03-26 LAB — CUP PACEART REMOTE DEVICE CHECK
Battery Remaining Longevity: 102 mo
Battery Remaining Percentage: 93 %
Battery Voltage: 3.1 V
Brady Statistic RV Percent Paced: 1 %
Date Time Interrogation Session: 20250827073748
HighPow Impedance: 66 Ohm
Implantable Lead Connection Status: 753985
Implantable Lead Implant Date: 20160603
Implantable Lead Location: 753860
Implantable Lead Model: 7122
Implantable Pulse Generator Implant Date: 20250714
Lead Channel Impedance Value: 390 Ohm
Lead Channel Pacing Threshold Amplitude: 1.25 V
Lead Channel Pacing Threshold Pulse Width: 1 ms
Lead Channel Sensing Intrinsic Amplitude: 8 mV
Lead Channel Setting Pacing Amplitude: 3 V
Lead Channel Setting Pacing Pulse Width: 1 ms
Lead Channel Setting Sensing Sensitivity: 0.5 mV
Pulse Gen Serial Number: 111074227

## 2024-03-31 ENCOUNTER — Encounter (HOSPITAL_COMMUNITY): Payer: Self-pay | Admitting: Hospitalist

## 2024-03-31 ENCOUNTER — Observation Stay (HOSPITAL_BASED_OUTPATIENT_CLINIC_OR_DEPARTMENT_OTHER)
Admission: EM | Admit: 2024-03-31 | Discharge: 2024-04-01 | Disposition: A | Discharge status: Home / Self Care | Source: Home / Self Care

## 2024-03-31 ENCOUNTER — Emergency Department (HOSPITAL_COMMUNITY)

## 2024-03-31 ENCOUNTER — Other Ambulatory Visit: Payer: Self-pay

## 2024-03-31 DIAGNOSIS — E039 Hypothyroidism, unspecified: Secondary | ICD-10-CM | POA: Insufficient documentation

## 2024-03-31 DIAGNOSIS — I110 Hypertensive heart disease with heart failure: Secondary | ICD-10-CM | POA: Insufficient documentation

## 2024-03-31 DIAGNOSIS — K5669 Other partial intestinal obstruction: Secondary | ICD-10-CM | POA: Diagnosis not present

## 2024-03-31 DIAGNOSIS — Z7982 Long term (current) use of aspirin: Secondary | ICD-10-CM | POA: Insufficient documentation

## 2024-03-31 DIAGNOSIS — K4090 Unilateral inguinal hernia, without obstruction or gangrene, not specified as recurrent: Secondary | ICD-10-CM | POA: Diagnosis not present

## 2024-03-31 DIAGNOSIS — K4031 Unilateral inguinal hernia, with obstruction, without gangrene, recurrent: Secondary | ICD-10-CM | POA: Diagnosis not present

## 2024-03-31 DIAGNOSIS — Z79899 Other long term (current) drug therapy: Secondary | ICD-10-CM | POA: Insufficient documentation

## 2024-03-31 DIAGNOSIS — Z85850 Personal history of malignant neoplasm of thyroid: Secondary | ICD-10-CM | POA: Insufficient documentation

## 2024-03-31 DIAGNOSIS — I509 Heart failure, unspecified: Secondary | ICD-10-CM | POA: Insufficient documentation

## 2024-03-31 DIAGNOSIS — R112 Nausea with vomiting, unspecified: Secondary | ICD-10-CM | POA: Diagnosis not present

## 2024-03-31 DIAGNOSIS — K56609 Unspecified intestinal obstruction, unspecified as to partial versus complete obstruction: Secondary | ICD-10-CM | POA: Diagnosis not present

## 2024-03-31 DIAGNOSIS — K5730 Diverticulosis of large intestine without perforation or abscess without bleeding: Secondary | ICD-10-CM | POA: Diagnosis not present

## 2024-03-31 DIAGNOSIS — Z4682 Encounter for fitting and adjustment of non-vascular catheter: Secondary | ICD-10-CM | POA: Diagnosis not present

## 2024-03-31 DIAGNOSIS — D3502 Benign neoplasm of left adrenal gland: Secondary | ICD-10-CM | POA: Diagnosis not present

## 2024-03-31 DIAGNOSIS — K4030 Unilateral inguinal hernia, with obstruction, without gangrene, not specified as recurrent: Secondary | ICD-10-CM | POA: Diagnosis not present

## 2024-03-31 DIAGNOSIS — I10 Essential (primary) hypertension: Secondary | ICD-10-CM | POA: Diagnosis not present

## 2024-03-31 DIAGNOSIS — K6389 Other specified diseases of intestine: Secondary | ICD-10-CM | POA: Diagnosis not present

## 2024-03-31 LAB — CBC
HCT: 57.1 % — ABNORMAL HIGH (ref 39.0–52.0)
Hemoglobin: 18.7 g/dL — ABNORMAL HIGH (ref 13.0–17.0)
MCH: 31.1 pg (ref 26.0–34.0)
MCHC: 32.7 g/dL (ref 30.0–36.0)
MCV: 94.9 fL (ref 80.0–100.0)
Platelets: 162 K/uL (ref 150–400)
RBC: 6.02 MIL/uL — ABNORMAL HIGH (ref 4.22–5.81)
RDW: 14.1 % (ref 11.5–15.5)
WBC: 18.7 K/uL — ABNORMAL HIGH (ref 4.0–10.5)
nRBC: 0 % (ref 0.0–0.2)

## 2024-03-31 LAB — COMPREHENSIVE METABOLIC PANEL WITH GFR
ALT: 19 U/L (ref 0–44)
AST: 22 U/L (ref 15–41)
Albumin: 3.8 g/dL (ref 3.5–5.0)
Alkaline Phosphatase: 47 U/L (ref 38–126)
Anion gap: 16 — ABNORMAL HIGH (ref 5–15)
BUN: 20 mg/dL (ref 8–23)
CO2: 19 mmol/L — ABNORMAL LOW (ref 22–32)
Calcium: 9.5 mg/dL (ref 8.9–10.3)
Chloride: 109 mmol/L (ref 98–111)
Creatinine, Ser: 1.24 mg/dL (ref 0.61–1.24)
GFR, Estimated: >60 mL/min (ref >60)
Glucose, Bld: 140 mg/dL — ABNORMAL HIGH (ref 70–99)
Potassium: 4.1 mmol/L (ref 3.5–5.1)
Sodium: 144 mmol/L (ref 135–145)
Total Bilirubin: 1.2 mg/dL (ref 0.0–1.2)
Total Protein: 6.7 g/dL (ref 6.5–8.1)

## 2024-03-31 LAB — LIPASE, BLOOD: Lipase: 22 U/L (ref 11–51)

## 2024-03-31 LAB — HIV ANTIBODY (ROUTINE TESTING W REFLEX): HIV Screen 4th Generation wRfx: NONREACTIVE

## 2024-03-31 MED ORDER — RANOLAZINE ER 500 MG PO TB12: 500.0000 mg | ORAL_TABLET | Freq: Two times a day (BID) | ORAL | Status: DC

## 2024-03-31 MED ORDER — SODIUM CHLORIDE 0.9 % IV BOLUS: 1000.0000 mL | Freq: Once | INTRAVENOUS | Status: DC

## 2024-03-31 MED ORDER — SODIUM CHLORIDE 0.9 % IV SOLN: INTRAVENOUS | Status: AC

## 2024-03-31 MED ORDER — ATORVASTATIN CALCIUM 40 MG PO TABS: 40.0000 mg | ORAL_TABLET | Freq: Every day | ORAL | Status: DC

## 2024-03-31 MED ORDER — LEVOTHYROXINE SODIUM 75 MCG PO TABS: 150.0000 ug | ORAL_TABLET | Freq: Every day | ORAL | Status: DC

## 2024-03-31 MED ORDER — ONDANSETRON HCL 4 MG/2ML IJ SOLN: 4.0000 mg | Freq: Once | INTRAMUSCULAR | Status: DC

## 2024-03-31 MED ORDER — ACETAMINOPHEN 10 MG/ML IV SOLN
1000.0000 mg | Freq: Four times a day (QID) | INTRAVENOUS | Status: AC
  Administered 2024-03-31 – 2024-04-01 (×3): 1000 mg via INTRAVENOUS
  Filled 2024-03-31 (×3): qty 100

## 2024-03-31 MED ORDER — FUROSEMIDE 20 MG PO TABS: 20.0000 mg | ORAL_TABLET | ORAL | Status: DC

## 2024-03-31 MED ORDER — ARIPIPRAZOLE 10 MG PO TABS: 5.0000 mg | ORAL_TABLET | Freq: Every day | ORAL | Status: DC

## 2024-03-31 MED ORDER — ONDANSETRON 4 MG PO TBDP
4.0000 mg | ORAL_TABLET | Freq: Once | ORAL | Status: AC | PRN
  Administered 2024-03-31: 4 mg via ORAL
  Filled 2024-03-31: qty 1

## 2024-03-31 MED ORDER — DOCUSATE SODIUM 100 MG PO CAPS: 100.0000 mg | ORAL_CAPSULE | ORAL | Status: DC

## 2024-03-31 MED ORDER — ONDANSETRON HCL 4 MG/2ML IJ SOLN
4.0000 mg | Freq: Four times a day (QID) | INTRAMUSCULAR | Status: DC | PRN
  Administered 2024-04-01: 4 mg via INTRAVENOUS
  Filled 2024-03-31: qty 2

## 2024-03-31 MED ORDER — SACUBITRIL-VALSARTAN 97-103 MG PO TABS: 1.0000 | ORAL_TABLET | Freq: Two times a day (BID) | ORAL | Status: DC

## 2024-03-31 MED ORDER — DOXAZOSIN MESYLATE 1 MG PO TABS: 1.0000 mg | ORAL_TABLET | Freq: Every day | ORAL | Status: DC

## 2024-03-31 MED ORDER — SERTRALINE HCL 50 MG PO TABS: 50.0000 mg | ORAL_TABLET | Freq: Every day | ORAL | Status: DC

## 2024-03-31 MED ORDER — SODIUM CHLORIDE 0.9 % IV BOLUS
500.0000 mL | Freq: Once | INTRAVENOUS | Status: AC
  Administered 2024-03-31: 500 mL via INTRAVENOUS

## 2024-03-31 MED ORDER — MEXILETINE HCL 150 MG PO CAPS: 300.0000 mg | ORAL_CAPSULE | Freq: Two times a day (BID) | ORAL | Status: DC

## 2024-03-31 MED ORDER — CARVEDILOL 3.125 MG PO TABS: 6.2500 mg | ORAL_TABLET | Freq: Two times a day (BID) | ORAL | Status: DC

## 2024-03-31 MED ORDER — PANTOPRAZOLE SODIUM 40 MG PO TBEC: 40.0000 mg | DELAYED_RELEASE_TABLET | Freq: Every day | ORAL | Status: DC

## 2024-03-31 MED ORDER — HYDROMORPHONE HCL 1 MG/ML IJ SOLN: 0.5000 mg | INTRAMUSCULAR | Status: DC | PRN

## 2024-03-31 NOTE — ED Triage Notes (Addendum)
 Pt BIB EMS from home for N/V starting this morning and complaints of being cold. Initial oral temp with EMS 94.2 but repeat was 97. Pt has a defibrillator, battery replaced 2 months ago. Hx of previous heart valve infection, redone in 2015. AOX4.

## 2024-03-31 NOTE — H&P (Signed)
 History and Physical    Patient: Bradley [name]. FMW:[id number] DOB: 12-25-1955 DOA: 03/31/2024 DOS: the patient was seen and examined on 03/31/2024 PCP: Micheal Wolm ORN, MD  Patient coming from: Home  Chief Complaint:  Chief Complaint  Patient presents with   Nausea   Emesis   Chills   HPI: Bradley [name]. is a 68 y.o. male with medical history significant of AS s/p bioprosthetic AVR, s/p AICD, HFpEF, HTN, Hypothyroid, ESRD and MDD w/ psychotic features p/w SBO.  The patient presented after experiencing severe vomiting and diarrhea, along with abdominal pain, that began at approximately 5:00 AM. The symptoms woke the patient from sleep and the vomitus was described as greenish in color. The patient reported that the vomiting was continuous and did not let up. Prior to the onset of symptoms, the patient did not have any other issues or symptoms. The patient currently is not experiencing abdominal pain, as a previous doctor (surgery) manually reset the hernia that had caused the symptoms. Per pt , this was the first occurrence of the hernia causing such symptoms.   In the ED, pt hypertensive and tachypneic w/o hypoxia. Labs notable for WBC 18.7. CT abd showed high-grade distal small bowel obstruction at the level of the large right inguinal hernia containing multiple distal small bowel loops. EDP placed NGT, consulted EGS and requested medicine admission.  Review of Systems: As mentioned in the history of present illness. All other systems reviewed and are negative. Past Medical History:  Diagnosis Date   Anxiety    Aortic stenosis    s/p Bentall with bioprosthetic AVR 02/2010; Last echo (9/11): Moderate LVH, EF 45-50%, AVR functioning appropriately, aortic valve mean gradient 21, diastolic dysfunction. Chest MRA (2/13): Mild to moderate dilatation at the sinus of Valsalva at 4.1 cm, mild dilatation ascending aorta distal to the tube graft at 3.9 cm, moderate dilatation of the  innominate artery a 2.1 cm;     CHF (congestive heart failure) (HCC)    Depression    ESRD on dialysis (HCC)    a. 09/2013 felt to be related to gentamycin b. no longer requiring HD   Hx of cardiac cath    a. LHC in 02/2010: normal cors   Hx of echocardiogram 02/2014   Echo (8/15):  Mod LV, EF 35-40%, Gr 1 DD, AVR ok (mean 14 mmHg), mild LAE   Hypertension    Hypothyroidism, postsurgical    Prosthetic valve endocarditis    Staphylococcus aureus bacteremia    Thyroid  cancer (HCC)    Ventricular tachycardia (HCC)    a. s/p STJ ICD   Past Surgical History:  Procedure Laterality Date   AORTIC VALVE REPAIR  1968   AORTIC VALVE REPLACEMENT  2011   AV FISTULA PLACEMENT Left 10/08/2013   Procedure: ARTERIOVENOUS (AV) FISTULA CREATION- LEFT ARM; Radial Cephalic ;  Surgeon: Lynwood JONETTA Collum, MD;  Location: [facility] OR;  Service: Vascular;  Laterality: Left;   BENTALL PROCEDURE N/A 10/28/2013   Procedure: REDO BENTALL PROCEDURE, debridment of aoritc root abscess, replacement of aortic root, ascending aorta and aortic valve with homograft. Insertion of left femoral arterial line;  Surgeon: Dallas KATHEE Jude, MD;  Location[facility] OR;  Service: Open Heart Surgery;  Laterality: N/A;   BIOPSY  07/22/2019   Procedure: BIOPSY;  Surgeon: Legrand Victory LITTIE DOUGLAS, MD;  Location: [facility] ENDOSCOPY;  Service: Gastroenterology;;   CARDIAC CATHETERIZATION  03/02/2010   NORMAL CORONARY ARTERY   CARDIAC CATHETERIZATION N/A 01/01/2015  Procedure: Coronary/Graft Angiography;  Surgeon: Debby DELENA Sor, MD;  Location: [facility] INVASIVE CV LAB;  Service: Cardiovascular;  Laterality: N/A;   CARDIAC VALVE REPLACEMENT     COLONOSCOPY WITH PROPOFOL  N/A 07/22/2019   Procedure: COLONOSCOPY WITH PROPOFOL ;  Surgeon: Legrand Victory LITTIE DOUGLAS, MD;  Location: [facility] ENDOSCOPY;  Service: Gastroenterology;  Laterality: N/A;   EP IMPLANTABLE DEVICE N/A 01/01/2015   Procedure: ICD Implant;  Surgeon: Elspeth JAYSON Sage, MD;  Location: [facility] INVASIVE CV LAB;  Service: Cardiovascular;   Laterality: N/A;   HEMOSTASIS CLIP PLACEMENT  07/22/2019   Procedure: HEMOSTASIS CLIP PLACEMENT;  Surgeon: Legrand Victory LITTIE DOUGLAS, MD;  Location: [facility];  Service: Gastroenterology;;   ICD GENERATOR CHANGEOUT N/A 02/11/2024   Procedure: ICD GENERATOR CHANGEOUT;  Surgeon: Inocencio Soyla Lunger, MD;  Location: [facility] INVASIVE CV LAB;  Service: Cardiovascular;  Laterality: N/A;   INTRAOPERATIVE TRANSESOPHAGEAL ECHOCARDIOGRAM N/A 10/28/2013   Procedure: INTRAOPERATIVE TRANSESOPHAGEAL ECHOCARDIOGRAM;  Surgeon: Dallas KATHEE Jude, MD;  Location: [facility] OR;  Service: Open Heart Surgery;  Laterality: N/A;   PERIPHERAL VASCULAR CATHETERIZATION N/A 01/01/2015   Procedure: Aortic Arch Angiography;  Surgeon: Debby DELENA Sor, MD;  Location: [facility] INVASIVE CV LAB;  Service: Cardiovascular;  Laterality: N/A;   POLYPECTOMY  07/22/2019   Procedure: POLYPECTOMY;  Surgeon: Legrand Victory LITTIE DOUGLAS, MD;  Location: [facility]OSCOPY;  Service: Gastroenterology;;   RIGHT/LEFT HEART CATH AND CORONARY ANGIOGRAPHY N/A 07/09/2023   Procedure: RIGHT/LEFT HEART CATH AND CORONARY ANGIOGRAPHY;  Surgeon: Cherrie Toribio SAUNDERS, MD;  Location: [facility] INVASIVE CV LAB;  Service: Cardiovascular;  Laterality: N/A;   SHOULDER ARTHROSCOPY W/ ROTATOR CUFF REPAIR Right 2012   STERNOTOMY     REDO   TEE WITHOUT CARDIOVERSION N/A 10/24/2013   Procedure: TRANSESOPHAGEAL ECHOCARDIOGRAM (TEE);  Surgeon: Leim VEAR Moose, MD;  Location: [facility] ENDOSCOPY;  Service: Cardiovascular;  Laterality: N/A;   THYROIDECTOMY  ~ 2005   TRANSTHORACIC ECHOCARDIOGRAM  03/2010   SHOWED MILD REDUCTION OF LV FUNCTION   Social History:  reports that he has never smoked. He has never used smokeless tobacco. He reports that he does not drink alcohol and does not use drugs.  Allergies  Allergen Reactions   Oxycodone  Other (See Comments)    Gives patient nightmares   Iodinated Contrast Media Rash   Rifampin  Nausea Only    Family History  Problem Relation Age of Onset   Hypertension Mother     Heart disease Father    Stroke Father    Hypertension Father    Thyroid  cancer Sister    Thyroid  cancer Sister    Kidney Stones Sister    Heart attack Neg Hx    Colon cancer Neg Hx    Colon polyps Neg Hx    Esophageal cancer Neg Hx    Stomach cancer Neg Hx    Rectal cancer Neg Hx     Prior to Admission medications   Medication Sig Start Date End Date Taking? Authorizing Provider  acetaminophen  (TYLENOL ) 325 MG tablet Take 325 mg by mouth 2 (two) times daily as needed for moderate pain or headache.    [provider]  amoxicillin (AMOXIL) 500 MG capsule SMARTSIG:4 Capsule(s) By Mouth 06/15/23   [provider]  ARIPiprazole  (ABILIFY ) 5 MG tablet Take 1 tablet nightly 03/07/24   Burchette, Wolm ORN, MD  aspirin  EC 81 MG tablet Take 81 mg by mouth at bedtime.     [provider]  atorvastatin  (LIPITOR) 40 MG tablet TAKE 1 TABLET BY MOUTH EVERY DAY 03/13/24  Burchette, Wolm ORN, MD  Carboxymethylcell-Hypromellose ([employer] LUBRICANT DROPS) 0.25-0.3 % GEL Place 1 drop into both ears daily as needed (Dry yes).    [provider]  carvedilol  (COREG ) 6.25 MG tablet TAKE 1 TABLET BY MOUTH TWICE A DAY WITH FOOD 11/09/23   Bensimhon, Toribio SAUNDERS, MD  clonazePAM  (KLONOPIN ) 1 MG tablet Take 0.5 mg by mouth daily as needed for anxiety.    [provider]  docusate sodium  (COLACE) 100 MG capsule Take 100 mg by mouth every other day.    [provider]  doxazosin  (CARDURA ) 1 MG tablet TAKE 1 TABLET (1 MG TOTAL) BY MOUTH DAILY. FOR SBP GREATER THAN 140 01/04/24   Bensimhon, Toribio SAUNDERS, MD  ENTRESTO  97-103 MG TAKE 1 TABLET BY MOUTH TWICE A DAY 01/14/24   Bensimhon, Toribio SAUNDERS, MD  FARXIGA  10 MG TABS tablet TAKE 1 TABLET BY MOUTH EVERY DAY 03/19/23   Bensimhon, Toribio SAUNDERS, MD  furosemide  (LASIX ) 20 MG tablet TAKE 1 TABLET BY MOUTH 4 TIMES A WEEK. Patient taking differently: Take 20 mg by mouth 3 (three) times a week. 01/14/24   Bensimhon, Toribio SAUNDERS, MD  levothyroxine   (SYNTHROID ) 150 MCG tablet TAKE 1 TABLET BY MOUTH EVERY DAY 03/19/24   Burchette, Wolm ORN, MD  mexiletine (MEXITIL ) 150 MG capsule TAKE 2 CAPSULES BY MOUTH EVERY MORNING AND 2 CAPSULES BY MOUTH EVERY EVENING. 03/24/24   Bensimhon, Toribio SAUNDERS, MD  Multiple Vitamins-Minerals (AIRBORNE) CHEW Chew 1 tablet by mouth at bedtime.    [provider]  omeprazole  (PRILOSEC) 20 MG capsule Take 20 mg by mouth daily as needed (Stomach).    [provider]  ranolazine  (RANEXA ) 500 MG 12 hr tablet TAKE 1 TABLET BY MOUTH TWICE A DAY 02/04/24   Bensimhon, Toribio SAUNDERS, MD  sertraline  (ZOLOFT ) 50 MG tablet TAKE 1 TABLET BY MOUTH EVERY DAY 02/04/24   Burchette, Wolm ORN, MD    Physical Exam: Vitals:   03/31/24 1049 03/31/24 1051 03/31/24 1400  BP:  (!) 151/101 (!) 161/84  Pulse:  81 77  Resp:  18 (!) 21  Temp: 97.6 F (36.4 C) 97.8 F (36.6 C)   TempSrc: Oral Oral   SpO2:  94% 97%   General: Alert, oriented x3, resting comfortably in no acute distress Respiratory: Lungs clear to auscultation bilaterally with normal respiratory effort; no w/r/r Cardiovascular: Regular rate and rhythm w/o m/r/g Abdomen: Soft, nontender, nondistended. Positive bowel sounds   Data Reviewed:  Lab Results  Component Value Date   WBC 18.7 (H) 03/31/2024   HGB 18.7 (H) 03/31/2024   HCT 57.1 (H) 03/31/2024   MCV 94.9 03/31/2024   PLT 162 03/31/2024   Lab Results  Component Value Date   GLUCOSE 86 01/29/2024   CALCIUM  9.8 01/29/2024   NA 144 01/29/2024   K 5.1 01/29/2024   CO2 24 01/29/2024   CL 105 01/29/2024   BUN 22 01/29/2024   CREATININE 1.20 01/29/2024   Lab Results  Component Value Date   ALT 33 11/17/2022   AST 32 11/17/2022   ALKPHOS 59 11/17/2022   BILITOT 0.4 11/17/2022   Lab Results  Component Value Date   INR 1.12 12/30/2014   INR 1.51 (H) 10/29/2013   INR 1.00 10/28/2013   Radiology: CT ABDOMEN PELVIS WO CONTRAST Result Date: 03/31/2024 EXAM: CT ABDOMEN AND PELVIS WITHOUT CONTRAST  03/31/2024 12:59:00 PM TECHNIQUE: CT of the abdomen and pelvis was performed without the administration of intravenous contrast. Multiplanar reformatted images are provided for review.  Automated exposure control, iterative reconstruction, and/or weight-based adjustment of the mA/kV was utilized to reduce the radiation dose to as low as reasonably achievable. COMPARISON: 03/16/23 CT abdomen/pelvis CLINICAL HISTORY: Abd pain, vomiting. No contrast; Pt BIB EMS from home for N/V starting this morning and complaints of being cold. Initial oral temp with EMS 94.2 but repeat was 97. Pt has a defibrillator, battery replaced 2 months ago. Hx of previous heart valve infection, redone in 2015. AOX4. FINDINGS: LOWER CHEST: Intact lower sternotomy wires. ICD lead indeterminant in the right ventricular apex. Very middle lobe 4 mm pulmonary nodule on series 5/image 6 is unchanged from 10/23/13 chest CT and considered benign. No acute abnormality of the lung bases. Fluid in the lower thoracic esophagus. LIVER: Chronic small coarse dystrophic calcification in the posterior right liver. Otherwise normal liver with no liver masses. GALLBLADDER AND BILE DUCTS: No wall thickening. No cholelithiasis. No biliary ductal dilatation. SPLEEN: Normal size. No focal lesion. PANCREAS: No mass. No ductal dilatation. ADRENAL GLANDS: Stable 1.3 cm left adrenal nodule with density negative for HU compatible with an adenoma. KIDNEYS, URETERS AND BLADDER: Scattered small simple bilateral renal cysts, largest 2.5 cm in the lateral upper right kidney, for which no follow up imaging is recommended. No stones in the kidneys or ureters. No hydronephrosis. No perinephric or periureteral stranding. Normal nondistended bladder. GI AND BOWEL: Large right inguinal hernia containing multiple small bowel loops. Diffuse dilatation of the small bowel up to 4 cm diameter proximal to the right inguinal hernia, with diffuse small bowel air-fluid levels compatible with  small bowel obstruction. Small amount of free fluid in the right inguinal hernia sac. Appendix not discretely visualized. Left colonic diverticulosis with no large bowel wall thickening or significant pericolonic fat stranding. No definite pneumatosis or bowel wall thickening. PERITONEUM AND RETROPERITONEUM: Trace right-sided ascites. No focal fluid collection. VASCULATURE: Atherosclerotic nonaneurysmal abdominal aorta. LYMPH NODES: No lymphadenopathy. REPRODUCTIVE ORGANS: Chronic mild prostatomegaly. BONES AND SOFT TISSUES: Moderate thoracolumbar spondylosis. No acute osseous abnormality. No focal soft tissue abnormality. IMPRESSION: 1. High-grade distal small bowel obstruction at the level of the large right inguinal hernia containing multiple distal small bowel loops. 2. Trace right-sided ascites and small volume fluid within the right inguinal hernia sac. No free air or definite pneumatosis. 3. Mild left colonic diverticulosis. 4. Stable small left adrenal adenoma, for which no follow-up imaging is recommended. Electronically signed by: Selinda Blue MD 03/31/2024 01:38 PM EDT RP Workstation: [id number]    Assessment and Plan: 79M h/o AS s/p bioprosthetic AVR, s/p AICD, HFpEF, HTN, Hypothyroid, ESRD and MDD w/ psychotic features p/w SBO.  SBO -EGS consulted; apprec eval/recs -MIVF: NS at 100cc/h for now -IV tylenol  1g QID for 24h -IV dilaudid  0.5mg  q4h prn -IV Zofran  prn -NGT in place   Advance Care Planning:   Code Status: Full Code   Consults: EGS  Family Communication: N/A  Severity of Illness: The appropriate patient status for this patient is INPATIENT. Inpatient status is judged to be reasonable and necessary in order to provide the required intensity of service to ensure the patient's safety. The patient's presenting symptoms, physical exam findings, and initial radiographic and laboratory data in the context of their chronic comorbidities is felt to place them at high risk for further  clinical deterioration. Furthermore, it is not anticipated that the patient will be medically stable for discharge from the hospital within 2 midnights of admission.   * I certify that at the point of admission it is my clinical  judgment that the patient will require inpatient hospital care spanning beyond 2 midnights from the point of admission due to high intensity of service, high risk for further deterioration and high frequency of surveillance required.*   ------- I spent 57 minutes reviewing previous notes, at the bedside counseling/discussing the treatment plan, and performing clinical documentation.  Author: Marsha Ada, MD 03/31/2024 2:26 PM  For on call review [email or web address].

## 2024-03-31 NOTE — Consult Note (Signed)
 Reason for Consult: Incarcerated right inguinal hernia Referring Physician: Dr. Loreda Elza JULIANNA Millicent Mickey. is an 68 y.o. male.  HPI: Patient is a 68 year old male, with history of aortic stenosis, CHF, aortic valve replacement-on antiplatelet medication. Patient comes in secondary to nausea vomiting, incarcerated hernia.  He states this occurred this morning.  He states he was able to reduce it.  States there is no precipitating events.  Patient underwent CT scan.  This was significant for carcinoid right ankle hernia with SBO.  Patient had nausea vomiting NG tube placed.    Reviewed the CT scan personally.  Patient with a leukocytosis of 18.7.  Past Medical History:  Diagnosis Date   Anxiety    Aortic stenosis    s/p Bentall with bioprosthetic AVR 02/2010; Last echo (9/11): Moderate LVH, EF 45-50%, AVR functioning appropriately, aortic valve mean gradient 21, diastolic dysfunction. Chest MRA (2/13): Mild to moderate dilatation at the sinus of Valsalva at 4.1 cm, mild dilatation ascending aorta distal to the tube graft at 3.9 cm, moderate dilatation of the innominate artery a 2.1 cm;     CHF (congestive heart failure) (HCC)    Depression    ESRD on dialysis (HCC)    a. 09/2013 felt to be related to gentamycin b. no longer requiring HD   Hx of cardiac cath    a. LHC in 02/2010: normal cors   Hx of echocardiogram 02/2014   Echo (8/15):  Mod LV, EF 35-40%, Gr 1 DD, AVR ok (mean 14 mmHg), mild LAE   Hypertension    Hypothyroidism, postsurgical    Prosthetic valve endocarditis    Staphylococcus aureus bacteremia    Thyroid  cancer (HCC)    Ventricular tachycardia (HCC)    a. s/p STJ ICD    Past Surgical History:  Procedure Laterality Date   AORTIC VALVE REPAIR  1968   AORTIC VALVE REPLACEMENT  2011   AV FISTULA PLACEMENT Left 10/08/2013   Procedure: ARTERIOVENOUS (AV) FISTULA CREATION- LEFT ARM; Radial Cephalic ;  Surgeon: Lynwood JONETTA Collum, MD;  Location: [facility] OR;  Service: Vascular;   Laterality: Left;   BENTALL PROCEDURE N/A 10/28/2013   Procedure: REDO BENTALL PROCEDURE, debridment of aoritc root abscess, replacement of aortic root, ascending aorta and aortic valve with homograft. Insertion of left femoral arterial line;  Surgeon: Dallas KATHEE Jude, MD;  Location: [facility] OR;  Service: Open Heart Surgery;  Laterality: N/A;   BIOPSY  07/22/2019   Procedure: BIOPSY;  Surgeon: Legrand Victory LITTIE DOUGLAS, MD;  Location: [facility];  Service: Gastroenterology;;   CARDIAC CATHETERIZATION  03/02/2010   NORMAL CORONARY ARTERY   CARDIAC CATHETERIZATION N/A 01/01/2015   Procedure: Coronary/Graft Angiography;  Surgeon: Debby DELENA Sor, MD;  Location: [facility] INVASIVE CV LAB;  Service: Cardiovascular;  Laterality: N/A;   CARDIAC VALVE REPLACEMENT     COLONOSCOPY WITH PROPOFOL  N/A 07/22/2019   Procedure: COLONOSCOPY WITH PROPOFOL ;  Surgeon: Legrand Victory LITTIE DOUGLAS, MD;  Location: [facility];  Service: Gastroenterology;  Laterality: N/A;   EP IMPLANTABLE DEVICE N/A 01/01/2015   Procedure: ICD Implant;  Surgeon: Elspeth JAYSON Sage, MD;  Location: [facility] INVASIVE CV LAB;  Service: Cardiovascular;  Laterality: N/A;   HEMOSTASIS CLIP PLACEMENT  07/22/2019   Procedure: HEMOSTASIS CLIP PLACEMENT;  Surgeon: Legrand Victory LITTIE DOUGLAS, MD;  Location: [facility];  Service: Gastroenterology;;   ICD GENERATOR CHANGEOUT N/A 02/11/2024   Procedure: ICD GENERATOR CHANGEOUT;  Surgeon: Inocencio Soyla Lunger, MD;  Location: [facility] INVASIVE CV LAB;  Service: Cardiovascular;  Laterality: N/A;   INTRAOPERATIVE TRANSESOPHAGEAL ECHOCARDIOGRAM N/A 10/28/2013   Procedure: INTRAOPERATIVE TRANSESOPHAGEAL ECHOCARDIOGRAM;  Surgeon: Dallas KATHEE Jude, MD;  Location: [facility] OR;  Service: Open Heart Surgery;  Laterality: N/A;   PERIPHERAL VASCULAR CATHETERIZATION N/A 01/01/2015   Procedure: Aortic Arch Angiography;  Surgeon: Debby DELENA Sor, MD;  Location: [name] INVASIVE CV LAB;  Service: Cardiovascular;  Laterality: N/A;   POLYPECTOMY  07/22/2019   Procedure: POLYPECTOMY;   Surgeon: Legrand Victory LITTIE DOUGLAS, MD;  Location: [facility];  Service: Gastroenterology;;   RIGHT/LEFT HEART CATH AND CORONARY ANGIOGRAPHY N/A 07/09/2023   Procedure: RIGHT/LEFT HEART CATH AND CORONARY ANGIOGRAPHY;  Surgeon: Cherrie Toribio SAUNDERS, MD;  Location: [facility] INVASIVE CV LAB;  Service: Cardiovascular;  Laterality: N/A;   SHOULDER ARTHROSCOPY W/ ROTATOR CUFF REPAIR Right 2012   STERNOTOMY     REDO   TEE WITHOUT CARDIOVERSION N/A 10/24/2013   Procedure: TRANSESOPHAGEAL ECHOCARDIOGRAM (TEE);  Surgeon: Leim VEAR Moose, MD;  Location: [facility] ENDOSCOPY;  Service: Cardiovascular;  Laterality: N/A;   THYROIDECTOMY  ~ 2005   TRANSTHORACIC ECHOCARDIOGRAM  03/2010   SHOWED MILD REDUCTION OF LV FUNCTION    Family History  Problem Relation Age of Onset   Hypertension Mother    Heart disease Father    Stroke Father    Hypertension Father    Thyroid  cancer Sister    Thyroid  cancer Sister    Kidney Stones Sister    Heart attack Neg Hx    Colon cancer Neg Hx    Colon polyps Neg Hx    Esophageal cancer Neg Hx    Stomach cancer Neg Hx    Rectal cancer Neg Hx     Social History:  reports that he has never smoked. He has never used smokeless tobacco. He reports that he does not drink alcohol and does not use drugs.  Allergies:  Allergies  Allergen Reactions   Oxycodone  Other (See Comments)    Gives patient nightmares   Iodinated Contrast Media Rash   Rifampin  Nausea Only    Medications: I have reviewed the patient's current medications.  Results for orders placed or performed during the hospital encounter of 03/31/24 (from the past 48 hours)  CBC     Status: Abnormal   Collection Time: 03/31/24 10:51 AM  Result Value Ref Range   WBC 18.7 (H) 4.0 - 10.5 K/uL   RBC 6.02 (H) 4.22 - 5.81 MIL/uL   Hemoglobin 18.7 (H) 13.0 - 17.0 g/dL   HCT 42.8 (H) 60.9 - 47.9 %   MCV 94.9 80.0 - 100.0 fL   MCH 31.1 26.0 - 34.0 pg   MCHC 32.7 30.0 - 36.0 g/dL   RDW 85.8 88.4 - 84.4 %   Platelets 162 150 -  400 K/uL   nRBC 0.0 0.0 - 0.2 %    Comment: Performed at [facility], [address] [place], [place] [address]   *Note: Due to a large number of results and/or encounters for the requested time period, some results have not been displayed. A complete set of results can be found in Results Review.    DG Abd Portable 1 View Result Date: 03/31/2024 EXAM: 1 VIEW XRAY OF THE ABDOMEN 03/31/2024 02:29:00 PM COMPARISON: None available. CLINICAL HISTORY: Post-NG. Reason for exam: Post-NG; Per triage notes: Pt BIB EMS from home for N/V starting this morning and complaints of being cold. Initial oral temp with EMS 94.2 but repeat was 97. Pt has a defibrillator, battery replaced 2 months ago. Hx of  previous heart valve infection, redone in 2015. AOX4. FINDINGS: BOWEL: Partially visualized distended small bowel loops in the upper abdomen. SOFT TISSUES: No opaque urinary calculi. Left chest wall single lead cardiac defibrillator noted. BONES: No acute osseous abnormality. LINES AND TUBES: Enteric tube in place with distal tip and side port in the proximal stomach. IMPRESSION: 1. Enteric tube in place with distal tip and side port in the proximal stomach. 2. Partially visualized distended small bowel loops in the upper abdomen, compatible with distal small bowel obstruction seen on CT from earlier today. Electronically signed by: Selinda Blue MD 03/31/2024 03:00 PM EDT RP Workstation: [id number]   CT ABDOMEN PELVIS WO CONTRAST Result Date: 03/31/2024 EXAM: CT ABDOMEN AND PELVIS WITHOUT CONTRAST 03/31/2024 12:59:00 PM TECHNIQUE: CT of the abdomen and pelvis was performed without the administration of intravenous contrast. Multiplanar reformatted images are provided for review. Automated exposure control, iterative reconstruction, and/or weight-based adjustment of the mA/kV was utilized to reduce the radiation dose to as low as reasonably achievable. COMPARISON: 03/16/23 CT abdomen/pelvis CLINICAL HISTORY: Abd pain,  vomiting. No contrast; Pt BIB EMS from home for N/V starting this morning and complaints of being cold. Initial oral temp with EMS 94.2 but repeat was 97. Pt has a defibrillator, battery replaced 2 months ago. Hx of previous heart valve infection, redone in 2015. AOX4. FINDINGS: LOWER CHEST: Intact lower sternotomy wires. ICD lead indeterminant in the right ventricular apex. Very middle lobe 4 mm pulmonary nodule on series 5/image 6 is unchanged from 10/23/13 chest CT and considered benign. No acute abnormality of the lung bases. Fluid in the lower thoracic esophagus. LIVER: Chronic small coarse dystrophic calcification in the posterior right liver. Otherwise normal liver with no liver masses. GALLBLADDER AND BILE DUCTS: No wall thickening. No cholelithiasis. No biliary ductal dilatation. SPLEEN: Normal size. No focal lesion. PANCREAS: No mass. No ductal dilatation. ADRENAL GLANDS: Stable 1.3 cm left adrenal nodule with density negative for HU compatible with an adenoma. KIDNEYS, URETERS AND BLADDER: Scattered small simple bilateral renal cysts, largest 2.5 cm in the lateral upper right kidney, for which no follow up imaging is recommended. No stones in the kidneys or ureters. No hydronephrosis. No perinephric or periureteral stranding. Normal nondistended bladder. GI AND BOWEL: Large right inguinal hernia containing multiple small bowel loops. Diffuse dilatation of the small bowel up to 4 cm diameter proximal to the right inguinal hernia, with diffuse small bowel air-fluid levels compatible with small bowel obstruction. Small amount of free fluid in the right inguinal hernia sac. Appendix not discretely visualized. Left colonic diverticulosis with no large bowel wall thickening or significant pericolonic fat stranding. No definite pneumatosis or bowel wall thickening. PERITONEUM AND RETROPERITONEUM: Trace right-sided ascites. No focal fluid collection. VASCULATURE: Atherosclerotic nonaneurysmal abdominal aorta.  LYMPH NODES: No lymphadenopathy. REPRODUCTIVE ORGANS: Chronic mild prostatomegaly. BONES AND SOFT TISSUES: Moderate thoracolumbar spondylosis. No acute osseous abnormality. No focal soft tissue abnormality. IMPRESSION: 1. High-grade distal small bowel obstruction at the level of the large right inguinal hernia containing multiple distal small bowel loops. 2. Trace right-sided ascites and small volume fluid within the right inguinal hernia sac. No free air or definite pneumatosis. 3. Mild left colonic diverticulosis. 4. Stable small left adrenal adenoma, for which no follow-up imaging is recommended. Electronically signed by: Selinda Blue MD 03/31/2024 01:38 PM EDT RP Workstation: [id number]    Review of Systems  Constitutional: Negative.  Negative for chills and fever.  HENT: Negative.  Negative for ear discharge, hearing loss and sore throat.  Eyes: Negative.  Negative for discharge.  Respiratory: Negative.  Negative for cough and shortness of breath.   Cardiovascular: Negative.  Negative for chest pain and leg swelling.  Gastrointestinal:  Positive for abdominal pain, nausea and vomiting. Negative for constipation and diarrhea.  Endocrine: Negative.   Musculoskeletal:  Negative for myalgias and neck pain.  Skin:  Negative for rash.  Allergic/Immunologic: Negative for environmental allergies.  Neurological: Negative.  Negative for dizziness and seizures.  Hematological:  Does not bruise/bleed easily.  Psychiatric/Behavioral:  Negative for suicidal ideas.   All other systems reviewed and are negative.  Blood pressure (!) 161/84, pulse 85, temperature (!) 97.3 F (36.3 C), temperature source Axillary, resp. rate 18, SpO2 95%. Physical Exam Constitutional:      Appearance: He is well-developed.     Comments: Conversant No acute distress  HENT:     Head: Normocephalic and atraumatic.  Eyes:     General: Lids are normal. No scleral icterus.    Pupils: Pupils are equal, round, and reactive to  light.     Comments: Pupils are equal round and reactive No lid lag Moist conjunctiva  Neck:     Thyroid : No thyromegaly.     Trachea: No tracheal tenderness.     Comments: No cervical lymphadenopathy Cardiovascular:     Rate and Rhythm: Normal rate and regular rhythm.     Heart sounds: No murmur heard. Pulmonary:     Effort: Pulmonary effort is normal.     Breath sounds: Normal breath sounds. No wheezing or rales.  Abdominal:     Tenderness: There is no abdominal tenderness.     Hernia: A hernia is present. Hernia is present in the right inguinal area.  Musculoskeletal:     Cervical back: Normal range of motion and neck supple.  Skin:    General: Skin is warm.     Findings: No rash.     Nails: There is no clubbing.     Comments: Normal skin turgor  Neurological:     Mental Status: He is alert and oriented to person, place, and time.     Comments: Normal gait and station  Psychiatric:        Mood and Affect: Mood normal.        Thought Content: Thought content normal.        Judgment: Judgment normal.     Comments: Appropriate affect     Assessment/Plan: 68 year old male with incarcerated hernia that was reduced in the ER. SBO secondary to incarcerated right inguinal hernia. History of aortic valve replacement CHF  1.  Hernia was reduced in the ER.  Would recommend p.o. trial.  If tolerated patient can likely be DC'd home.  Patient will require another month of antiplatelet therapy prior to being able to be off of it for any surgery per cardiology.  No current surgical plans. 2.  If patient is admitted overnight can follow-up in a.m. to be sure he is tolerating p.o.  Lynda Leos 03/31/2024, 3:06 PM

## 2024-03-31 NOTE — ED Notes (Signed)
 IV team at bedside

## 2024-03-31 NOTE — ED Provider Notes (Addendum)
 [place] EMERGENCY DEPARTMENT AT [facility] Provider Note   CSN: [id number] Arrival date & time: 03/31/24  1044     Patient presents with: Nausea, Emesis, and Chills   Bradley [name]. is a 68 y.o. male.   68 year old male presents for evaluation of nausea vomiting and diarrhea that started this morning.  States he has had some cramping allover abdominal pain that started today as well.  Denies any fevers, chills, shortness of breath, chest pain, or any other symptoms or concerns at this time.   Emesis Associated symptoms: diarrhea   Associated symptoms: no abdominal pain, no arthralgias, no chills, no cough, no fever and no sore throat        Prior to Admission medications   Medication Sig Start Date End Date Taking? Authorizing Provider  acetaminophen  (TYLENOL ) 325 MG tablet Take 325 mg by mouth 2 (two) times daily as needed for moderate pain or headache.    [provider]  amoxicillin (AMOXIL) 500 MG capsule SMARTSIG:4 Capsule(s) By Mouth 06/15/23   [provider]  ARIPiprazole  (ABILIFY ) 5 MG tablet Take 1 tablet nightly 03/07/24   Burchette, Wolm ORN, MD  aspirin  EC 81 MG tablet Take 81 mg by mouth at bedtime.     [provider]  atorvastatin  (LIPITOR) 40 MG tablet TAKE 1 TABLET BY MOUTH EVERY DAY 03/13/24   Burchette, Wolm ORN, MD  Carboxymethylcell-Hypromellose ([employer] LUBRICANT DROPS) 0.25-0.3 % GEL Place 1 drop into both ears daily as needed (Dry yes).    [provider]  carvedilol  (COREG ) 6.25 MG tablet TAKE 1 TABLET BY MOUTH TWICE A DAY WITH FOOD 11/09/23   Bensimhon, Toribio SAUNDERS, MD  clonazePAM  (KLONOPIN ) 1 MG tablet Take 0.5 mg by mouth daily as needed for anxiety.    [provider]  docusate sodium  (COLACE) 100 MG capsule Take 100 mg by mouth every other day.    [provider]  doxazosin  (CARDURA ) 1 MG tablet TAKE 1 TABLET (1 MG TOTAL) BY MOUTH DAILY. FOR SBP GREATER THAN 140 01/04/24   Bensimhon, Toribio SAUNDERS,  MD  ENTRESTO  97-103 MG TAKE 1 TABLET BY MOUTH TWICE A DAY 01/14/24   Bensimhon, Toribio SAUNDERS, MD  FARXIGA  10 MG TABS tablet TAKE 1 TABLET BY MOUTH EVERY DAY 03/19/23   Bensimhon, Toribio SAUNDERS, MD  furosemide  (LASIX ) 20 MG tablet TAKE 1 TABLET BY MOUTH 4 TIMES A WEEK. Patient taking differently: Take 20 mg by mouth 3 (three) times a week. 01/14/24   Bensimhon, Toribio SAUNDERS, MD  levothyroxine  (SYNTHROID ) 150 MCG tablet TAKE 1 TABLET BY MOUTH EVERY DAY 03/19/24   Burchette, Wolm ORN, MD  mexiletine (MEXITIL ) 150 MG capsule TAKE 2 CAPSULES BY MOUTH EVERY MORNING AND 2 CAPSULES BY MOUTH EVERY EVENING. 03/24/24   Bensimhon, Toribio SAUNDERS, MD  Multiple Vitamins-Minerals (AIRBORNE) CHEW Chew 1 tablet by mouth at bedtime.    [provider]  omeprazole  (PRILOSEC) 20 MG capsule Take 20 mg by mouth daily as needed (Stomach).    [provider]  ranolazine  (RANEXA ) 500 MG 12 hr tablet TAKE 1 TABLET BY MOUTH TWICE A DAY 02/04/24   Bensimhon, Toribio SAUNDERS, MD  sertraline  (ZOLOFT ) 50 MG tablet TAKE 1 TABLET BY MOUTH EVERY DAY 02/04/24   Burchette, Wolm ORN, MD    Allergies: Oxycodone , Iodinated contrast media, and Rifampin     Review of Systems  Constitutional:  Negative for chills and fever.  HENT:  Negative for ear pain and sore throat.   Eyes:  Negative for pain and visual disturbance.  Respiratory:  Negative for cough and shortness of breath.   Cardiovascular:  Negative for chest pain and palpitations.  Gastrointestinal:  Positive for diarrhea, nausea and vomiting. Negative for abdominal pain.  Genitourinary:  Negative for dysuria and hematuria.  Musculoskeletal:  Negative for arthralgias and back pain.  Skin:  Negative for color change and rash.  Neurological:  Negative for seizures and syncope.  All other systems reviewed and are negative.   Updated Vital Signs BP (!) 161/84   Pulse 85   Temp (!) 97.3 F (36.3 C) (Axillary)   Resp 18   SpO2 95%   Physical Exam Vitals and nursing note reviewed.   Constitutional:      General: He is not in acute distress.    Appearance: Normal appearance. He is well-developed. He is not ill-appearing.  HENT:     Head: Normocephalic and atraumatic.  Eyes:     Conjunctiva/sclera: Conjunctivae normal.  Cardiovascular:     Rate and Rhythm: Normal rate and regular rhythm.     Heart sounds: Normal heart sounds. No murmur heard. Pulmonary:     Effort: Pulmonary effort is normal. No respiratory distress.     Breath sounds: Normal breath sounds. No stridor. No wheezing or rhonchi.  Abdominal:     Palpations: Abdomen is soft.     Tenderness: There is abdominal tenderness.     Comments: Diffuse abdominal tenderness to palpation  Musculoskeletal:        General: No swelling.     Cervical back: Neck supple.  Skin:    General: Skin is warm and dry.     Capillary Refill: Capillary refill takes less than 2 seconds.  Neurological:     Mental Status: He is alert.  Psychiatric:        Mood and Affect: Mood normal.     (all labs ordered are listed, but only abnormal results are displayed) Labs Reviewed  CBC - Abnormal; Notable for the following components:      Result Value   WBC 18.7 (*)    RBC 6.02 (*)    Hemoglobin 18.7 (*)    HCT 57.1 (*)    All other components within normal limits  URINALYSIS, ROUTINE W REFLEX MICROSCOPIC  COMPREHENSIVE METABOLIC PANEL WITH GFR  LIPASE, BLOOD  HIV ANTIBODY (ROUTINE TESTING W REFLEX)  I-STAT CHEM 8, ED    EKG: EKG Interpretation Date/Time:  Monday March 31 2024 10:53:17 EDT Ventricular Rate:  83 PR Interval:  208 QRS Duration:  181 QT Interval:  440 QTC Calculation: 518 R Axis:   91  Text Interpretation: Sinus rhythm Nonspecific intraventricular conduction delay Minimal ST depression, inferior leads Occasional PVC Compared with previous EKG from  11/30/2023 Confirmed by Gennaro Bouchard ([id number]) on 03/31/2024 10:55:52 AM  Radiology: [facility] Abd Portable 1 View Result Date: 03/31/2024 EXAM: 1 VIEW XRAY  OF THE ABDOMEN 03/31/2024 02:29:00 PM COMPARISON: None available. CLINICAL HISTORY: Post-NG. Reason for exam: Post-NG; Per triage notes: Pt BIB EMS from home for N/V starting this morning and complaints of being cold. Initial oral temp with EMS 94.2 but repeat was 97. Pt has a defibrillator, battery replaced 2 months ago. Hx of previous heart valve infection, redone in 2015. AOX4. FINDINGS: BOWEL: Partially visualized distended small bowel loops in the upper abdomen. SOFT TISSUES: No opaque urinary calculi. Left chest wall single lead cardiac defibrillator noted. BONES: No acute osseous abnormality. LINES AND TUBES: Enteric tube in place with distal tip and  side port in the proximal stomach. IMPRESSION: 1. Enteric tube in place with distal tip and side port in the proximal stomach. 2. Partially visualized distended small bowel loops in the upper abdomen, compatible with distal small bowel obstruction seen on CT from earlier today. Electronically signed by: Selinda Blue MD 03/31/2024 03:00 PM EDT RP Workstation: [id number]   CT ABDOMEN PELVIS WO CONTRAST Result Date: 03/31/2024 EXAM: CT ABDOMEN AND PELVIS WITHOUT CONTRAST 03/31/2024 12:59:00 PM TECHNIQUE: CT of the abdomen and pelvis was performed without the administration of intravenous contrast. Multiplanar reformatted images are provided for review. Automated exposure control, iterative reconstruction, and/or weight-based adjustment of the mA/kV was utilized to reduce the radiation dose to as low as reasonably achievable. COMPARISON: 03/16/23 CT abdomen/pelvis CLINICAL HISTORY: Abd pain, vomiting. No contrast; Pt BIB EMS from home for N/V starting this morning and complaints of being cold. Initial oral temp with EMS 94.2 but repeat was 97. Pt has a defibrillator, battery replaced 2 months ago. Hx of previous heart valve infection, redone in 2015. AOX4. FINDINGS: LOWER CHEST: Intact lower sternotomy wires. ICD lead indeterminant in the right ventricular apex. Very  middle lobe 4 mm pulmonary nodule on series 5/image 6 is unchanged from 10/23/13 chest CT and considered benign. No acute abnormality of the lung bases. Fluid in the lower thoracic esophagus. LIVER: Chronic small coarse dystrophic calcification in the posterior right liver. Otherwise normal liver with no liver masses. GALLBLADDER AND BILE DUCTS: No wall thickening. No cholelithiasis. No biliary ductal dilatation. SPLEEN: Normal size. No focal lesion. PANCREAS: No mass. No ductal dilatation. ADRENAL GLANDS: Stable 1.3 cm left adrenal nodule with density negative for HU compatible with an adenoma. KIDNEYS, URETERS AND BLADDER: Scattered small simple bilateral renal cysts, largest 2.5 cm in the lateral upper right kidney, for which no follow up imaging is recommended. No stones in the kidneys or ureters. No hydronephrosis. No perinephric or periureteral stranding. Normal nondistended bladder. GI AND BOWEL: Large right inguinal hernia containing multiple small bowel loops. Diffuse dilatation of the small bowel up to 4 cm diameter proximal to the right inguinal hernia, with diffuse small bowel air-fluid levels compatible with small bowel obstruction. Small amount of free fluid in the right inguinal hernia sac. Appendix not discretely visualized. Left colonic diverticulosis with no large bowel wall thickening or significant pericolonic fat stranding. No definite pneumatosis or bowel wall thickening. PERITONEUM AND RETROPERITONEUM: Trace right-sided ascites. No focal fluid collection. VASCULATURE: Atherosclerotic nonaneurysmal abdominal aorta. LYMPH NODES: No lymphadenopathy. REPRODUCTIVE ORGANS: Chronic mild prostatomegaly. BONES AND SOFT TISSUES: Moderate thoracolumbar spondylosis. No acute osseous abnormality. No focal soft tissue abnormality. IMPRESSION: 1. High-grade distal small bowel obstruction at the level of the large right inguinal hernia containing multiple distal small bowel loops. 2. Trace right-sided ascites  and small volume fluid within the right inguinal hernia sac. No free air or definite pneumatosis. 3. Mild left colonic diverticulosis. 4. Stable small left adrenal adenoma, for which no follow-up imaging is recommended. Electronically signed by: Selinda Blue MD 03/31/2024 01:38 PM EDT RP Workstation: [id number]     Procedures   Medications Ordered in the ED  ondansetron  (ZOFRAN ) injection 4 mg (has no administration in time range)  0.9 %  sodium chloride  infusion (has no administration in time range)  acetaminophen  (OFIRMEV ) IV 1,000 mg (has no administration in time range)  HYDROmorphone  (DILAUDID ) injection 0.5 mg (has no administration in time range)  ondansetron  (ZOFRAN ) injection 4 mg (has no administration in time range)  ondansetron  (ZOFRAN -ODT) disintegrating tablet  4 mg (4 mg Oral Given 03/31/24 1325)  sodium chloride  0.9 % bolus 500 mL (500 mLs Intravenous New Bag/Given 03/31/24 1323)                                    Medical Decision Making Cardiac monitor interpretation: Sinus rhythm, no ectopy  Patient's lab workup reviewed by me he has a leukocytosis but labs otherwise fairly unremarkable here here he is here for vomiting and was found to have a right inguinal hernia with likely incarcerated bowel causing a small bowel obstruction.  I did attempt to reduce the hernia but was unable to do so.  NG tube was placed.  Patient did have several episodes of vomiting here and was given Zofran  as well as IV fluids.  Hospitalist was consulted and patient will be admitted for further workup and management.  General surgery did come down to the bedside and was able to reduce the hernia.  Recommended observation to see if patient tolerates p.o. patient and family bedside agreeable with the plan.  Problems Addressed: Nausea and vomiting, unspecified vomiting type: acute illness or injury Right inguinal hernia: chronic illness or injury with exacerbation, progression, or side effects of  treatment Small bowel obstruction (HCC): acute illness or injury that poses a threat to life or bodily functions  Amount and/or Complexity of Data Reviewed External Data Reviewed: notes.    Details: Outpatient records reviewed patient follows with cardiology for heart failure Labs: ordered. Decision-making details documented in ED Course.    Details: Ordered and reviewed by me and patient has a significant leukocytosis but labs otherwise fairly unremarkable Radiology: ordered and independent interpretation performed. Decision-making details documented in ED Course.    Details: CT abdomen pelvis ordered and reviewed by me and shows evidence of right inguinal hernia with bowel loops in the hernia and small bowel obstruction Discussion of management or test interpretation with external provider(s): Dr. Rubin -surgery-I spoke with him regarding the patient he evaluated the patient was able to reduce the hernia  Dr. Georgina -hospitalist-I spoke with him regarding the patient and he will admit the patient to his service  Risk OTC drugs. Prescription drug management. Drug therapy requiring intensive monitoring for toxicity. Decision regarding hospitalization.     Final diagnoses:  Small bowel obstruction (HCC)  Right inguinal hernia  Nausea and vomiting, unspecified vomiting type    ED Discharge Orders     None          Gennaro Duwaine CROME, DO 03/31/24 1517    Daven Pinckney L, DO 03/31/24 1518

## 2024-03-31 NOTE — ED Notes (Signed)
 2 IV attempts, blood return noted but veins blow when advancing catheter

## 2024-04-01 ENCOUNTER — Encounter: Payer: Self-pay | Admitting: Family Medicine

## 2024-04-01 DIAGNOSIS — K56609 Unspecified intestinal obstruction, unspecified as to partial versus complete obstruction: Secondary | ICD-10-CM | POA: Diagnosis not present

## 2024-04-01 DIAGNOSIS — R1111 Vomiting without nausea: Secondary | ICD-10-CM | POA: Diagnosis not present

## 2024-04-01 DIAGNOSIS — Z743 Need for continuous supervision: Secondary | ICD-10-CM | POA: Diagnosis not present

## 2024-04-01 DIAGNOSIS — K4031 Unilateral inguinal hernia, with obstruction, without gangrene, recurrent: Secondary | ICD-10-CM | POA: Diagnosis not present

## 2024-04-01 DIAGNOSIS — K5669 Other partial intestinal obstruction: Secondary | ICD-10-CM | POA: Diagnosis not present

## 2024-04-01 DIAGNOSIS — R110 Nausea: Secondary | ICD-10-CM | POA: Diagnosis not present

## 2024-04-01 DIAGNOSIS — R197 Diarrhea, unspecified: Secondary | ICD-10-CM | POA: Diagnosis not present

## 2024-04-01 LAB — URINALYSIS, ROUTINE W REFLEX MICROSCOPIC
Bacteria, UA: NONE SEEN
Bilirubin Urine: NEGATIVE
Glucose, UA: >=500 mg/dL — AB
Hgb urine dipstick: NEGATIVE
Ketones, ur: 5 mg/dL — AB
Leukocytes,Ua: NEGATIVE
Nitrite: NEGATIVE
Protein, ur: NEGATIVE mg/dL
Specific Gravity, Urine: 1.036 — ABNORMAL HIGH (ref 1.005–1.030)
pH: 5 (ref 5.0–8.0)

## 2024-04-01 MED ORDER — CLONAZEPAM 0.5 MG PO TABS: 0.5000 mg | ORAL_TABLET | Freq: Every day | ORAL | Status: DC | PRN

## 2024-04-01 MED ORDER — SERTRALINE HCL 25 MG PO TABS
50.0000 mg | ORAL_TABLET | Freq: Every day | ORAL | Status: DC
  Administered 2024-04-01: 50 mg via ORAL
  Filled 2024-04-01: qty 2

## 2024-04-01 MED ORDER — INFLUENZA VAC SPLIT HIGH-DOSE 0.5 ML IM SUSY: 0.5000 mL | PREFILLED_SYRINGE | INTRAMUSCULAR | Status: DC

## 2024-04-01 MED ORDER — ATORVASTATIN CALCIUM 40 MG PO TABS
40.0000 mg | ORAL_TABLET | Freq: Every day | ORAL | Status: DC
  Administered 2024-04-01: 40 mg via ORAL
  Filled 2024-04-01: qty 1

## 2024-04-01 MED ORDER — MEXILETINE HCL 150 MG PO CAPS
300.0000 mg | ORAL_CAPSULE | Freq: Two times a day (BID) | ORAL | Status: DC
  Filled 2024-04-01: qty 2

## 2024-04-01 MED ORDER — DOXAZOSIN MESYLATE 1 MG PO TABS
1.0000 mg | ORAL_TABLET | Freq: Every day | ORAL | Status: DC
  Filled 2024-04-01: qty 1

## 2024-04-01 MED ORDER — DAPAGLIFLOZIN PROPANEDIOL 10 MG PO TABS
10.0000 mg | ORAL_TABLET | Freq: Every day | ORAL | Status: DC
  Administered 2024-04-01: 10 mg via ORAL
  Filled 2024-04-01: qty 1

## 2024-04-01 MED ORDER — LEVOTHYROXINE SODIUM 75 MCG PO TABS: 150.0000 ug | ORAL_TABLET | Freq: Every day | ORAL | Status: DC

## 2024-04-01 MED ORDER — ARIPIPRAZOLE 5 MG PO TABS: 5.0000 mg | ORAL_TABLET | Freq: Every day | ORAL | Status: DC

## 2024-04-01 MED ORDER — FUROSEMIDE 20 MG PO TABS: 20.0000 mg | ORAL_TABLET | ORAL | Status: DC

## 2024-04-01 MED ORDER — RANOLAZINE ER 500 MG PO TB12
500.0000 mg | ORAL_TABLET | Freq: Two times a day (BID) | ORAL | Status: DC
  Filled 2024-04-01: qty 1

## 2024-04-01 MED ORDER — CARVEDILOL 6.25 MG PO TABS: 6.2500 mg | ORAL_TABLET | Freq: Two times a day (BID) | ORAL | Status: DC

## 2024-04-01 MED ORDER — SACUBITRIL-VALSARTAN 97-103 MG PO TABS
1.0000 | ORAL_TABLET | Freq: Two times a day (BID) | ORAL | Status: DC
  Filled 2024-04-01: qty 1

## 2024-04-01 NOTE — Progress Notes (Signed)
 NGT removed with no difficulty

## 2024-04-01 NOTE — Progress Notes (Addendum)
 Transition of Care [facility]) - Inpatient Brief Assessment   Patient Details  Name: Bradley [name]. MRN: [id number] Date of Birth: 04-25-1956  Transition of [facility]) CM/SW Contact:    Rosaline JONELLE Joe, RN Phone Number: 04/01/2024, 1:59 PM   Clinical Narrative: Patient admitted to the hospital from home with SBO.  Nasogastric tube was removed by nursing today.  No IP Care management needs at this time.  04/01/24 1545 - Patient met with the patient at the bedside and provide the Medicare observation letter and completed the code 44.  Bedside nursing will discharge the patient home with family.  AVS was printed.  No IP Care management needs at this time.   Transition of Care Asessment: Insurance and Status: (P) Insurance coverage has been reviewed Patient has primary care physician: (P) Yes Home environment has been reviewed: (P) from home Prior level of function:: (P) self Prior/Current Home Services: (P) No current home services Social Drivers of Health Review: (P) SDOH reviewed no interventions necessary Readmission risk has been reviewed: (P) Yes Transition of care needs: (P) no transition of care needs at this time

## 2024-04-01 NOTE — Care Management CC44 (Cosign Needed)
 Condition Code 44 Documentation Completed  Patient Details  Name: Bradley [name]. MRN: [id number] Date of Birth: 07/10/1956   Condition Code 44 given:  Yes Patient signature on Condition Code 44 notice:  Yes Documentation of 2 MD's agreement:  Yes Code 44 added to claim:  Yes    Rosaline JONELLE Joe, RN 04/01/2024, 3:35 PM

## 2024-04-01 NOTE — Progress Notes (Signed)
 PROGRESS NOTE    Bradley [name].  FMW:[id number] DOB: 18-Sep-1955 DOA: 03/31/2024 PCP: Bradley [name] ORN, MD   Brief Narrative:  Bradley [name] is a 68 y.o. male with h/o HFrEF / NICM with VT s/p ICD placement 02/11/2024, congenital AS s/p balloon valvotomy at age 63 / AV repair 1968, HTN, presented with nausea vomiting abdominal pain, was diagnosed with distal SBO and incarcerated inguinal hernia, admitted to hospital service, general surgery consulted.  Assessment & Plan:   Principal Problem:   SBO (small bowel obstruction) (HCC)  SBO/incarcerated right inguinal hernia: Inguinal hernia was reduced by general surgery yesterday.  NGT was placed with LIS in the ED.  Seen by general surgery again today, had recurrence of the inguinal hernia but once again was soft and easily reducible.  Patient is passing flatus.  NG tube has been clamped.  He denies any abdominal pain, nausea or vomiting.  General surgery plans to start him on liquid diet if he remains asymptomatic.  General surgery believes that he will have recurrence of the protrusion of the small bowel contents in the groin or hernia however they are trying to avoid the surgery unless it becomes resistant/incarcerated and requires surgical intervention due to patient having ICD placement just 2 months ago.  Patient is on aspirin  and general surgery has recommended to hold that.  Hyperlipidemia: On atorvastatin  PTA, on hold at the moment due to patient being NPO.  Will resume once able to take p.o. meds.  Nonischemic cardiomyopathy/combined systolic and diastolic congestive heart failure: Recent echo done in November 2024 shows 20 to 25% ejection fraction and grade 1 diastolic dysfunction.  Patient on multiple medications PTA and sees cardiology regularly.  Status post ICD placement in July 2025.  Medications on hold at the moment due to patient being NPO.  Acquired hypothyroidism: Will resume Synthroid  once patient is able to take  p.o.  MDD: Resume Zoloft  once able to.  DVT prophylaxis: SCDs Start: 03/31/24 1422   Code Status: Full Code  Family Communication:  None present at bedside.  Plan of care discussed with patient in length and he/she verbalized understanding and agreed with it.  Status is: Inpatient Remains inpatient appropriate because: NG tube clamped, awaiting further plans by general surgery.   Estimated body mass index is 25.65 kg/m as calculated from the following:   Height as of this encounter: 6' 1 (1.854 m).   Weight as of this encounter: 88.2 kg.    Nutritional Assessment: Body mass index is 25.65 kg/m.Bradley [name] Seen by dietician.  I agree with the assessment and plan as outlined below: Nutrition Status:        . Skin Assessment: I have examined the patient's skin and I agree with the wound assessment as performed by the wound care RN as outlined below:    Consultants:  General surgery  Procedures:  As above  Antimicrobials:  Anti-infectives (From admission, onward)    None         Subjective: Seen and examined, he has no complaints.  Denies any abdominal pain, nausea or vomiting.  Passing flatus.  No bowel movement yet.  Objective: Vitals:   03/31/24 2056 04/01/24 0013 04/01/24 0459 04/01/24 0723  BP:  122/77 123/80 110/71  Pulse:  86 92 94  Resp:  16 19   Temp:  (!) 97.5 F (36.4 C) 99.8 F (37.7 C)   TempSrc:  Axillary Axillary   SpO2:  96% 97% 97%  Weight: 88.2 kg  Height: 6' 1 (1.854 m)       Intake/Output Summary (Last 24 hours) at 04/01/2024 1019 Last data filed at 04/01/2024 0518 Gross per 24 hour  Intake 1551.17 ml  Output 1405 ml  Net 146.17 ml   Filed Weights   03/31/24 2056  Weight: 88.2 kg    Examination:  General exam: Appears calm and comfortable  Respiratory system: Clear to auscultation. Respiratory effort normal. Cardiovascular system: S1 & S2 heard, RRR. No JVD, murmurs, rubs, gallops or clicks. No pedal edema. Gastrointestinal  system: Abdomen is nondistended, soft and nontender. No organomegaly or masses felt.  Sluggish bowel sounds. Central nervous system: Alert and oriented. No focal neurological deficits. Extremities: Symmetric 5 x 5 power. Skin: No rashes, lesions or ulcers Psychiatry: Judgement and insight appear normal. Mood & affect appropriate.    Data Reviewed: I have personally reviewed following labs and imaging studies  CBC: Recent Labs  Lab 03/31/24 1051  WBC 18.7*  HGB 18.7*  HCT 57.1*  MCV 94.9  PLT 162   Basic Metabolic Panel: Recent Labs  Lab 03/31/24 1640  NA 144  K 4.1  CL 109  CO2 19*  GLUCOSE 140*  BUN 20  CREATININE 1.24  CALCIUM  9.5   GFR: Estimated Creatinine Clearance: 65.3 mL/min (by C-G formula based on SCr of 1.24 mg/dL). Liver Function Tests: Recent Labs  Lab 03/31/24 1640  AST 22  ALT 19  ALKPHOS 47  BILITOT 1.2  PROT 6.7  ALBUMIN  3.8   Recent Labs  Lab 03/31/24 1640  LIPASE 22   No results for input(s): AMMONIA in the last 168 hours. Coagulation Profile: No results for input(s): INR, PROTIME in the last 168 hours. Cardiac Enzymes: No results for input(s): CKTOTAL, CKMB, CKMBINDEX, TROPONINI in the last 168 hours. BNP (last 3 results) No results for input(s): PROBNP in the last 8760 hours. HbA1C: No results for input(s): HGBA1C in the last 72 hours. CBG: No results for input(s): GLUCAP in the last 168 hours. Lipid Profile: No results for input(s): CHOL, HDL, LDLCALC, TRIG, CHOLHDL, LDLDIRECT in the last 72 hours. Thyroid  Function Tests: No results for input(s): TSH, T4TOTAL, FREET4, T3FREE, THYROIDAB in the last 72 hours. Anemia Panel: No results for input(s): VITAMINB12, FOLATE, FERRITIN, TIBC, IRON, RETICCTPCT in the last 72 hours. Sepsis Labs: No results for input(s): PROCALCITON, LATICACIDVEN in the last 168 hours.  No results found for this or any previous visit (from the past  240 hours).   Radiology Studies: DG Abd Portable 1 View Result Date: 03/31/2024 EXAM: 1 VIEW XRAY OF THE ABDOMEN 03/31/2024 02:29:00 PM COMPARISON: None available. CLINICAL HISTORY: Post-NG. Reason for exam: Post-NG; Per triage notes: Pt BIB EMS from home for N/V starting this morning and complaints of being cold. Initial oral temp with EMS 94.2 but repeat was 97. Pt has a defibrillator, battery replaced 2 months ago. Hx of previous heart valve infection, redone in 2015. AOX4. FINDINGS: BOWEL: Partially visualized distended small bowel loops in the upper abdomen. SOFT TISSUES: No opaque urinary calculi. Left chest wall single lead cardiac defibrillator noted. BONES: No acute osseous abnormality. LINES AND TUBES: Enteric tube in place with distal tip and side port in the proximal stomach. IMPRESSION: 1. Enteric tube in place with distal tip and side port in the proximal stomach. 2. Partially visualized distended small bowel loops in the upper abdomen, compatible with distal small bowel obstruction seen on CT from earlier today. Electronically signed by: Selinda Blue MD 03/31/2024 03:00 PM EDT RP  Workstation: [id number]   CT ABDOMEN PELVIS WO CONTRAST Result Date: 03/31/2024 EXAM: CT ABDOMEN AND PELVIS WITHOUT CONTRAST 03/31/2024 12:59:00 PM TECHNIQUE: CT of the abdomen and pelvis was performed without the administration of intravenous contrast. Multiplanar reformatted images are provided for review. Automated exposure control, iterative reconstruction, and/or weight-based adjustment of the mA/kV was utilized to reduce the radiation dose to as low as reasonably achievable. COMPARISON: 03/16/23 CT abdomen/pelvis CLINICAL HISTORY: Abd pain, vomiting. No contrast; Pt BIB EMS from home for N/V starting this morning and complaints of being cold. Initial oral temp with EMS 94.2 but repeat was 97. Pt has a defibrillator, battery replaced 2 months ago. Hx of previous heart valve infection, redone in 2015. AOX4. FINDINGS:  LOWER CHEST: Intact lower sternotomy wires. ICD lead indeterminant in the right ventricular apex. Very middle lobe 4 mm pulmonary nodule on series 5/image 6 is unchanged from 10/23/13 chest CT and considered benign. No acute abnormality of the lung bases. Fluid in the lower thoracic esophagus. LIVER: Chronic small coarse dystrophic calcification in the posterior right liver. Otherwise normal liver with no liver masses. GALLBLADDER AND BILE DUCTS: No wall thickening. No cholelithiasis. No biliary ductal dilatation. SPLEEN: Normal size. No focal lesion. PANCREAS: No mass. No ductal dilatation. ADRENAL GLANDS: Stable 1.3 cm left adrenal nodule with density negative for HU compatible with an adenoma. KIDNEYS, URETERS AND BLADDER: Scattered small simple bilateral renal cysts, largest 2.5 cm in the lateral upper right kidney, for which no follow up imaging is recommended. No stones in the kidneys or ureters. No hydronephrosis. No perinephric or periureteral stranding. Normal nondistended bladder. GI AND BOWEL: Large right inguinal hernia containing multiple small bowel loops. Diffuse dilatation of the small bowel up to 4 cm diameter proximal to the right inguinal hernia, with diffuse small bowel air-fluid levels compatible with small bowel obstruction. Small amount of free fluid in the right inguinal hernia sac. Appendix not discretely visualized. Left colonic diverticulosis with no large bowel wall thickening or significant pericolonic fat stranding. No definite pneumatosis or bowel wall thickening. PERITONEUM AND RETROPERITONEUM: Trace right-sided ascites. No focal fluid collection. VASCULATURE: Atherosclerotic nonaneurysmal abdominal aorta. LYMPH NODES: No lymphadenopathy. REPRODUCTIVE ORGANS: Chronic mild prostatomegaly. BONES AND SOFT TISSUES: Moderate thoracolumbar spondylosis. No acute osseous abnormality. No focal soft tissue abnormality. IMPRESSION: 1. High-grade distal small bowel obstruction at the level of the  large right inguinal hernia containing multiple distal small bowel loops. 2. Trace right-sided ascites and small volume fluid within the right inguinal hernia sac. No free air or definite pneumatosis. 3. Mild left colonic diverticulosis. 4. Stable small left adrenal adenoma, for which no follow-up imaging is recommended. Electronically signed by: Selinda Blue MD 03/31/2024 01:38 PM EDT RP Workstation: [id number]    Scheduled Meds:  [START ON 04/02/2024] Influenza vac split trivalent PF  0.5 mL Intramuscular Tomorrow-1000   ondansetron  (ZOFRAN ) IV  4 mg Intravenous Once   Continuous Infusions:  sodium chloride  100 mL/hr at 04/01/24 0518   acetaminophen  1,000 mg (04/01/24 0700)     LOS: 1 day   Fredia Skeeter, MD [facility]  04/01/2024, 10:19 AM   *Please note that this is a verbal dictation therefore any spelling or grammatical errors are due to the Dragon Medical One system interpretation.  Please page via Amion and do not message via secure chat for urgent patient care matters. Secure chat can be used for non urgent patient care matters.  How to contact the [facility] Attending or Consulting provider 7A - 7P or covering  provider during after hours 7P -7A, for this patient?  Check the care team in [facility] and look for a) attending/consulting [facility] provider listed and b) the [facility] team listed. Page or secure chat 7A-7P. Log into [email or web address] and use [facility]'s universal password to access. If you do not have the password, please contact the hospital operator. Locate the [facility] provider you are looking for under [facility] and page to a number that you can be directly reached. If you still have difficulty reaching the provider, please page the [facility] (Director on Call) for the Hospitalists listed on amion for assistance.

## 2024-04-01 NOTE — Discharge Summary (Signed)
 Physician Discharge Summary  Bradley [name]. FMW:[id number] DOB: 12/08/1955 DOA: 03/31/2024  PCP: Bradley Wolm ORN, MD  Admit date: 03/31/2024 Discharge date: 04/01/2024 30 Day Unplanned Readmission Risk Score    Flowsheet Row ED to Hosp-Admission (Current) from 03/31/2024 in [facility]  30 Day Unplanned Readmission Risk Score (%) 9.68 Filed at 04/01/2024 1200    This score is the patient's risk of an unplanned readmission within 30 days of being discharged (0 -100%). The score is based on dignosis, age, lab data, medications, orders, and past utilization.   Low:  0-14.9   Medium: 15-21.9   High: 22-29.9   Extreme: 30 and above          Admitted From: Home Disposition: Home  Recommendations for Outpatient Follow-up:  Follow up with PCP in 1-2 weeks Please obtain BMP/CBC in one week Follow-up with general surgery in 3 weeks as scheduled for you Please follow up with your PCP on the following pending results: Unresulted Labs (From admission, onward)     Start     Ordered   04/02/24 0500  CBC with Differential/Platelet  Tomorrow morning,   R       Question:  Specimen collection method  Answer:  Lab=Lab collect   04/01/24 1027   04/02/24 0500  Basic metabolic panel  Tomorrow morning,   R       Question:  Specimen collection method  Answer:  Lab=Lab collect   04/01/24 1027              Home Health: None Equipment/Devices: None  Discharge Condition: Stable CODE STATUS: Full code Diet recommendation:  Diet Order             Diet regular Room service appropriate? Yes; Fluid consistency: Thin  Diet effective now                   Subjective: Patient seen and examined earlier, he was passing flatus, no abdominal pain or nausea vomiting.  Brief/Interim Summary: Bradley [name] is a 68 y.o. male with h/o HFrEF / NICM with VT s/p ICD placement 02/11/2024, congenital AS s/p balloon valvotomy at age 26 / AV repair 1968, HTN, presented with nausea  vomiting abdominal pain, was diagnosed with distal SBO and incarcerated inguinal hernia, admitted to hospital service, general surgery consulted. Inguinal hernia was reduced by general surgery yesterday.  NGT was placed with LIS in the ED.  Seen by general surgery again today, had recurrence of the inguinal hernia but once again was soft and easily reducible.  Patient started passing flatus this morning, NG tube was clamped.  He did well.  NGT was eventually removed and he was started on diet which he tolerated.  He was reassessed by general surgery in the afternoon and I was informed by general surgery that patient is stable and they recommended discharging home and they will follow-up with him in the clinic in 3 weeks.  Of note, patient has told general surgery that he had a small bowel movement earlier today.   Resuming rest of the medications.  He is stable.  Discharge plan was discussed with patient and/or family member and they verbalized understanding and agreed with it.  Discharge Diagnoses:  Principal Problem:   SBO (small bowel obstruction) (HCC)    Discharge Instructions   Allergies as of 04/01/2024       Reactions   Oxycodone  Other (See Comments)   Gives patient nightmares   Iodinated Contrast  Media Rash   Rifampin  Nausea Only        Medication List     TAKE these medications    Airborne Chew Chew 1 tablet by mouth at bedtime.   amoxicillin 500 MG capsule Commonly known as: AMOXIL SMARTSIG:4 Capsule(s) By Mouth   ARIPiprazole  5 MG tablet Commonly known as: Abilify  Take 1 tablet nightly What changed:  how much to take how to take this when to take this additional instructions   aspirin  EC 81 MG tablet Take 81 mg by mouth at bedtime.   atorvastatin  40 MG tablet Commonly known as: LIPITOR TAKE 1 TABLET BY MOUTH EVERY DAY What changed: when to take this   carvedilol  6.25 MG tablet Commonly known as: COREG  TAKE 1 TABLET BY MOUTH TWICE A DAY WITH FOOD    clonazePAM  1 MG tablet Commonly known as: KLONOPIN  Take 0.5 mg by mouth daily as needed for anxiety.   docusate sodium  100 MG capsule Commonly known as: COLACE Take 100 mg by mouth 3 (three) times a week.   doxazosin  1 MG tablet Commonly known as: CARDURA  TAKE 1 TABLET (1 MG TOTAL) BY MOUTH DAILY. FOR SBP GREATER THAN 140 What changed:  when to take this reasons to take this additional instructions   Entresto  97-103 MG Generic drug: sacubitril -valsartan  TAKE 1 TABLET BY MOUTH TWICE A DAY   Farxiga  10 MG Tabs tablet Generic drug: dapagliflozin  propanediol TAKE 1 TABLET BY MOUTH EVERY DAY   furosemide  20 MG tablet Commonly known as: LASIX  TAKE 1 TABLET BY MOUTH 4 TIMES A WEEK. What changed: See the new instructions.   levothyroxine  150 MCG tablet Commonly known as: SYNTHROID  TAKE 1 TABLET BY MOUTH EVERY DAY   mexiletine 150 MG capsule Commonly known as: MEXITIL  TAKE 2 CAPSULES BY MOUTH EVERY MORNING AND 2 CAPSULES BY MOUTH EVERY EVENING.   omeprazole  20 MG capsule Commonly known as: PRILOSEC Take 20 mg by mouth daily as needed (Stomach).   ranolazine  500 MG 12 hr tablet Commonly known as: RANEXA  TAKE 1 TABLET BY MOUTH TWICE A DAY   sertraline  50 MG tablet Commonly known as: ZOLOFT  TAKE 1 TABLET BY MOUTH EVERY DAY        Follow-up Information     Rubin Calamity, MD Follow up.   Specialty: General Surgery Why: Our office is scheduling you for follow up in 3-4 weeks to discuss surgical repair of your hernia. Please call to confirm appointment date/time. Contact information: [address] [place] [address] [phone]         Bradley Wolm ORN, MD Follow up in 1 week(s).   Specialty: Family Medicine Contact information: [address] [place] [address] [phone]                Allergies  Allergen Reactions   Oxycodone  Other (See Comments)    Gives patient nightmares   Iodinated Contrast Media Rash    Rifampin  Nausea Only    Consultations: General Surgery   Procedures/Studies: DG Abd Portable 1 View Result Date: 03/31/2024 EXAM: 1 VIEW XRAY OF THE ABDOMEN 03/31/2024 02:29:00 PM COMPARISON: None available. CLINICAL HISTORY: Post-NG. Reason for exam: Post-NG; Per triage notes: Pt BIB EMS from home for N/V starting this morning and complaints of being cold. Initial oral temp with EMS 94.2 but repeat was 97. Pt has a defibrillator, battery replaced 2 months ago. Hx of previous heart valve infection, redone in 2015. AOX4. FINDINGS: BOWEL: Partially visualized distended small bowel loops in  the upper abdomen. SOFT TISSUES: No opaque urinary calculi. Left chest wall single lead cardiac defibrillator noted. BONES: No acute osseous abnormality. LINES AND TUBES: Enteric tube in place with distal tip and side port in the proximal stomach. IMPRESSION: 1. Enteric tube in place with distal tip and side port in the proximal stomach. 2. Partially visualized distended small bowel loops in the upper abdomen, compatible with distal small bowel obstruction seen on CT from earlier today. Electronically signed by: Selinda Blue MD 03/31/2024 03:00 PM EDT RP Workstation: [id number]   CT ABDOMEN PELVIS WO CONTRAST Result Date: 03/31/2024 EXAM: CT ABDOMEN AND PELVIS WITHOUT CONTRAST 03/31/2024 12:59:00 PM TECHNIQUE: CT of the abdomen and pelvis was performed without the administration of intravenous contrast. Multiplanar reformatted images are provided for review. Automated exposure control, iterative reconstruction, and/or weight-based adjustment of the mA/kV was utilized to reduce the radiation dose to as low as reasonably achievable. COMPARISON: 03/16/23 CT abdomen/pelvis CLINICAL HISTORY: Abd pain, vomiting. No contrast; Pt BIB EMS from home for N/V starting this morning and complaints of being cold. Initial oral temp with EMS 94.2 but repeat was 97. Pt has a defibrillator, battery replaced 2 months ago. Hx of previous heart  valve infection, redone in 2015. AOX4. FINDINGS: LOWER CHEST: Intact lower sternotomy wires. ICD lead indeterminant in the right ventricular apex. Very middle lobe 4 mm pulmonary nodule on series 5/image 6 is unchanged from 10/23/13 chest CT and considered benign. No acute abnormality of the lung bases. Fluid in the lower thoracic esophagus. LIVER: Chronic small coarse dystrophic calcification in the posterior right liver. Otherwise normal liver with no liver masses. GALLBLADDER AND BILE DUCTS: No wall thickening. No cholelithiasis. No biliary ductal dilatation. SPLEEN: Normal size. No focal lesion. PANCREAS: No mass. No ductal dilatation. ADRENAL GLANDS: Stable 1.3 cm left adrenal nodule with density negative for HU compatible with an adenoma. KIDNEYS, URETERS AND BLADDER: Scattered small simple bilateral renal cysts, largest 2.5 cm in the lateral upper right kidney, for which no follow up imaging is recommended. No stones in the kidneys or ureters. No hydronephrosis. No perinephric or periureteral stranding. Normal nondistended bladder. GI AND BOWEL: Large right inguinal hernia containing multiple small bowel loops. Diffuse dilatation of the small bowel up to 4 cm diameter proximal to the right inguinal hernia, with diffuse small bowel air-fluid levels compatible with small bowel obstruction. Small amount of free fluid in the right inguinal hernia sac. Appendix not discretely visualized. Left colonic diverticulosis with no large bowel wall thickening or significant pericolonic fat stranding. No definite pneumatosis or bowel wall thickening. PERITONEUM AND RETROPERITONEUM: Trace right-sided ascites. No focal fluid collection. VASCULATURE: Atherosclerotic nonaneurysmal abdominal aorta. LYMPH NODES: No lymphadenopathy. REPRODUCTIVE ORGANS: Chronic mild prostatomegaly. BONES AND SOFT TISSUES: Moderate thoracolumbar spondylosis. No acute osseous abnormality. No focal soft tissue abnormality. IMPRESSION: 1. High-grade  distal small bowel obstruction at the level of the large right inguinal hernia containing multiple distal small bowel loops. 2. Trace right-sided ascites and small volume fluid within the right inguinal hernia sac. No free air or definite pneumatosis. 3. Mild left colonic diverticulosis. 4. Stable small left adrenal adenoma, for which no follow-up imaging is recommended. Electronically signed by: Selinda Blue MD 03/31/2024 01:38 PM EDT RP Workstation: [id number]   CUP PACEART REMOTE DEVICE CHECK Result Date: 03/26/2024 ICD scheduled remote reviewed. Normal device function.  Presenting rhythm: VS Next remote 91 days. AB, CVRS    Discharge Exam: Vitals:   04/01/24 0723 04/01/24 1132  BP: 110/71 113/66  Pulse:  94 77  Resp:    Temp:  98.2 F (36.8 C)  SpO2: 97% 96%   Vitals:   04/01/24 0013 04/01/24 0459 04/01/24 0723 04/01/24 1132  BP: 122/77 123/80 110/71 113/66  Pulse: 86 92 94 77  Resp: 16 19    Temp: (!) 97.5 F (36.4 C) 99.8 F (37.7 C)  98.2 F (36.8 C)  TempSrc: Axillary Axillary    SpO2: 96% 97% 97% 96%  Weight:      Height:        General: Pt is alert, awake, not in acute distress Cardiovascular: RRR, S1/S2 +, no rubs, no gallops Respiratory: CTA bilaterally, no wheezing, no rhonchi Abdominal: Soft, NT, ND, bowel sounds + Extremities: no edema, no cyanosis    The results of significant diagnostics from this hospitalization (including imaging, microbiology, ancillary and laboratory) are listed below for reference.     Microbiology: No results found for this or any previous visit (from the past 240 hours).   Labs: BNP (last 3 results) Recent Labs    06/26/23 1626  BNP 329.2*   Basic Metabolic Panel: Recent Labs  Lab 03/31/24 1640  NA 144  K 4.1  CL 109  CO2 19*  GLUCOSE 140*  BUN 20  CREATININE 1.24  CALCIUM  9.5   Liver Function Tests: Recent Labs  Lab 03/31/24 1640  AST 22  ALT 19  ALKPHOS 47  BILITOT 1.2  PROT 6.7  ALBUMIN  3.8   Recent  Labs  Lab 03/31/24 1640  LIPASE 22   No results for input(s): AMMONIA in the last 168 hours. CBC: Recent Labs  Lab 03/31/24 1051  WBC 18.7*  HGB 18.7*  HCT 57.1*  MCV 94.9  PLT 162   Cardiac Enzymes: No results for input(s): CKTOTAL, CKMB, CKMBINDEX, TROPONINI in the last 168 hours. BNP: Invalid input(s): POCBNP CBG: No results for input(s): GLUCAP in the last 168 hours. D-Dimer No results for input(s): DDIMER in the last 72 hours. Hgb A1c No results for input(s): HGBA1C in the last 72 hours. Lipid Profile No results for input(s): CHOL, HDL, LDLCALC, TRIG, CHOLHDL, LDLDIRECT in the last 72 hours. Thyroid  function studies No results for input(s): TSH, T4TOTAL, T3FREE, THYROIDAB in the last 72 hours.  Invalid input(s): FREET3 Anemia work up No results for input(s): VITAMINB12, FOLATE, FERRITIN, TIBC, IRON, RETICCTPCT in the last 72 hours. Urinalysis    Component Value Date/Time   COLORURINE YELLOW 03/31/2024 0519   APPEARANCEUR CLEAR 03/31/2024 0519   LABSPEC 1.036 (H) 03/31/2024 0519   PHURINE 5.0 03/31/2024 0519   GLUCOSEU >=500 (A) 03/31/2024 0519   HGBUR NEGATIVE 03/31/2024 0519   BILIRUBINUR NEGATIVE 03/31/2024 0519   KETONESUR 5 (A) 03/31/2024 0519   PROTEINUR NEGATIVE 03/31/2024 0519   UROBILINOGEN 0.2 11/23/2013 1339   NITRITE NEGATIVE 03/31/2024 0519   LEUKOCYTESUR NEGATIVE 03/31/2024 0519   Sepsis Labs Recent Labs  Lab 03/31/24 1051  WBC 18.7*   Microbiology No results found for this or any previous visit (from the past 240 hours).  FURTHER DISCHARGE INSTRUCTIONS:   Get Medicines reviewed and adjusted: Please take all your medications with you for your next visit with your Primary MD   Laboratory/radiological data: Please request your Primary MD to go over all hospital tests and procedure/radiological results at the follow up, please ask your Primary MD to get all Hospital records sent to  his/her office.   In some cases, they will be blood work, cultures and biopsy results pending at the time of your  discharge. Please request that your primary care M.D. goes through all the records of your hospital data and follows up on these results.   Also Note the following: If you experience worsening of your admission symptoms, develop shortness of breath, life threatening emergency, suicidal or homicidal thoughts you must seek medical attention immediately by calling 911 or calling your MD immediately  if symptoms less severe.   You must read complete instructions/literature along with all the possible adverse reactions/side effects for all the Medicines you take and that have been prescribed to you. Take any new Medicines after you have completely understood and accpet all the possible adverse reactions/side effects.    patient was instructed, not to drive, operate heavy machinery, perform activities at heights, swimming or participation in water  activities or provide baby-sitting services while on Pain, Sleep and Anxiety Medications; until their outpatient Physician has advised to do so again. Also recommended to not to take more than prescribed Pain, Sleep and Anxiety Medications.  It is not advisable to combine anxiety, sleep and pain medications without talking with your primary care provider.     Wear Seat belts while driving.   Please note: You were cared for by a hospitalist during your hospital stay. Once you are discharged, your primary care physician will handle any further medical issues. Please note that NO REFILLS for any discharge medications will be authorized once you are discharged, as it is imperative that you return to your primary care physician (or establish a relationship with a primary care physician if you do not have one) for your post hospital discharge needs so that they can reassess your need for medications and monitor your lab values  Time coordinating discharge:  Over 30 minutes  SIGNED:   Fredia Skeeter, MD  [facility] 04/01/2024, 4:53 PM *Please note that this is a verbal dictation therefore any spelling or grammatical errors are due to the Dragon Medical One system interpretation. If 7PM-7AM, please contact night-coverage [email or web address]

## 2024-04-01 NOTE — Care Management Obs Status (Cosign Needed)
 MEDICARE OBSERVATION STATUS NOTIFICATION   Patient Details  Name: Bradley [name]. MRN: [id number] Date of Birth: 03/28/1956   Medicare Observation Status Notification Given:  Yes    Rosaline JONELLE Joe, RN 04/01/2024, 3:35 PM

## 2024-04-01 NOTE — Plan of Care (Signed)

## 2024-04-01 NOTE — Progress Notes (Signed)
 Progress Note     Interval: Hernia partially recurred, able to reduce again. No N/V, no pain  Objective: Vital signs in last 24 hours: Temp:  [97.3 F (36.3 C)-99.8 F (37.7 C)] 99.8 F (37.7 C) (09/02 0459) Pulse Rate:  [77-94] 94 (09/02 0723) Resp:  [16-21] 19 (09/02 0459) BP: (110-161)/(71-101) 110/71 (09/02 0723) SpO2:  [94 %-97 %] 97 % (09/02 0723) Weight:  [88.2 kg] 88.2 kg (09/01 2056) Last BM Date : 03/31/24  Intake/Output from previous day: 09/01 0701 - 09/02 0700 In: 1551.2 [P.O.:170; I.V.:1181; IV Piggyback:200.1] Out: 1405 [Urine:30; Emesis/NG output:1375] Intake/Output this shift: No intake/output data recorded.  PE: General: NAD HEENT: NGT clamped  Heart: regular, rate, and rhythm Lungs: Normal work of breathing on room air Abd: soft, NT, ND. RIH soft, able to reduce, nontender MS: all 4 extremities are symmetrical with no cyanosis, clubbing, or edema. Skin: warm and dry with no masses, lesions, or rashes Neuro: No focal neurologic deficits Psych: A&Ox3 with an appropriate affect.    Lab Results:  Recent Labs    03/31/24 1051  WBC 18.7*  HGB 18.7*  HCT 57.1*  PLT 162   BMET Recent Labs    03/31/24 1640  NA 144  K 4.1  CL 109  CO2 19*  GLUCOSE 140*  BUN 20  CREATININE 1.24  CALCIUM  9.5   PT/INR No results for input(s): LABPROT, INR in the last 72 hours. CMP     Component Value Date/Time   NA 144 03/31/2024 1640   NA 144 01/29/2024 1237   K 4.1 03/31/2024 1640   CL 109 03/31/2024 1640   CO2 19 (L) 03/31/2024 1640   GLUCOSE 140 (H) 03/31/2024 1640   BUN 20 03/31/2024 1640   BUN 22 01/29/2024 1237   CREATININE 1.24 03/31/2024 1640   CREATININE 1.28 10/14/2015 1228   CALCIUM  9.5 03/31/2024 1640   PROT 6.7 03/31/2024 1640   ALBUMIN  3.8 03/31/2024 1640   AST 22 03/31/2024 1640   ALT 19 03/31/2024 1640   ALKPHOS 47 03/31/2024 1640   BILITOT 1.2 03/31/2024 1640   GFRNONAA >60 03/31/2024 1640   GFRAA 74 04/13/2020 1609    Lipase     Component Value Date/Time   LIPASE 22 03/31/2024 1640       Studies/Results: DG Abd Portable 1 View Result Date: 03/31/2024 EXAM: 1 VIEW XRAY OF THE ABDOMEN 03/31/2024 02:29:00 PM COMPARISON: None available. CLINICAL HISTORY: Post-NG. Reason for exam: Post-NG; Per triage notes: Pt BIB EMS from home for N/V starting this morning and complaints of being cold. Initial oral temp with EMS 94.2 but repeat was 97. Pt has a defibrillator, battery replaced 2 months ago. Hx of previous heart valve infection, redone in 2015. AOX4. FINDINGS: BOWEL: Partially visualized distended small bowel loops in the upper abdomen. SOFT TISSUES: No opaque urinary calculi. Left chest wall single lead cardiac defibrillator noted. BONES: No acute osseous abnormality. LINES AND TUBES: Enteric tube in place with distal tip and side port in the proximal stomach. IMPRESSION: 1. Enteric tube in place with distal tip and side port in the proximal stomach. 2. Partially visualized distended small bowel loops in the upper abdomen, compatible with distal small bowel obstruction seen on CT from earlier today. Electronically signed by: Selinda Blue MD 03/31/2024 03:00 PM EDT RP Workstation: [id number]   CT ABDOMEN PELVIS WO CONTRAST Result Date: 03/31/2024 EXAM: CT ABDOMEN AND PELVIS WITHOUT CONTRAST 03/31/2024 12:59:00 PM TECHNIQUE: CT of the abdomen and pelvis was performed without  the administration of intravenous contrast. Multiplanar reformatted images are provided for review. Automated exposure control, iterative reconstruction, and/or weight-based adjustment of the mA/kV was utilized to reduce the radiation dose to as low as reasonably achievable. COMPARISON: 03/16/23 CT abdomen/pelvis CLINICAL HISTORY: Abd pain, vomiting. No contrast; Pt BIB EMS from home for N/V starting this morning and complaints of being cold. Initial oral temp with EMS 94.2 but repeat was 97. Pt has a defibrillator, battery replaced 2 months ago. Hx of  previous heart valve infection, redone in 2015. AOX4. FINDINGS: LOWER CHEST: Intact lower sternotomy wires. ICD lead indeterminant in the right ventricular apex. Very middle lobe 4 mm pulmonary nodule on series 5/image 6 is unchanged from 10/23/13 chest CT and considered benign. No acute abnormality of the lung bases. Fluid in the lower thoracic esophagus. LIVER: Chronic small coarse dystrophic calcification in the posterior right liver. Otherwise normal liver with no liver masses. GALLBLADDER AND BILE DUCTS: No wall thickening. No cholelithiasis. No biliary ductal dilatation. SPLEEN: Normal size. No focal lesion. PANCREAS: No mass. No ductal dilatation. ADRENAL GLANDS: Stable 1.3 cm left adrenal nodule with density negative for HU compatible with an adenoma. KIDNEYS, URETERS AND BLADDER: Scattered small simple bilateral renal cysts, largest 2.5 cm in the lateral upper right kidney, for which no follow up imaging is recommended. No stones in the kidneys or ureters. No hydronephrosis. No perinephric or periureteral stranding. Normal nondistended bladder. GI AND BOWEL: Large right inguinal hernia containing multiple small bowel loops. Diffuse dilatation of the small bowel up to 4 cm diameter proximal to the right inguinal hernia, with diffuse small bowel air-fluid levels compatible with small bowel obstruction. Small amount of free fluid in the right inguinal hernia sac. Appendix not discretely visualized. Left colonic diverticulosis with no large bowel wall thickening or significant pericolonic fat stranding. No definite pneumatosis or bowel wall thickening. PERITONEUM AND RETROPERITONEUM: Trace right-sided ascites. No focal fluid collection. VASCULATURE: Atherosclerotic nonaneurysmal abdominal aorta. LYMPH NODES: No lymphadenopathy. REPRODUCTIVE ORGANS: Chronic mild prostatomegaly. BONES AND SOFT TISSUES: Moderate thoracolumbar spondylosis. No acute osseous abnormality. No focal soft tissue abnormality. IMPRESSION:  1. High-grade distal small bowel obstruction at the level of the large right inguinal hernia containing multiple distal small bowel loops. 2. Trace right-sided ascites and small volume fluid within the right inguinal hernia sac. No free air or definite pneumatosis. 3. Mild left colonic diverticulosis. 4. Stable small left adrenal adenoma, for which no follow-up imaging is recommended. Electronically signed by: Selinda Blue MD 03/31/2024 01:38 PM EDT RP Workstation: [id number]     Assessment/Plan 67 yo M with AS, CHF, AVR on antiplatelet therapy who presented with incarcerated RIH with SBO. Reduced yesterday by Dr. Rubin.   LOS: 1 day  - NGT still in place, would have patient have CLD for breakfast. If tolerates then can remove NGT and advance to regular diet.  - Can dc when tolerating regular diet - Hernia recurred partially but able to reduce again. Hernia likely to recur but if no evidence of strangulation/SBO then reasonable to delay repair until 3 months s/p AVR in order to hold antiplatelet therapy pre-op. If any evidence of of strangulated recurrent hernia/hernia causing SBO then will need to reconsider urgent repair - We will arrange outpatient fu  I reviewed hospitalist notes, last 24 h vitals and pain scores, last 48 h intake and output, last 24 h labs and trends, and last 24 h imaging results.  This care required high  level of medical decision making.  Richerd Silversmith, MD [facility] Surgery 04/01/2024, 8:45 AM Please see Amion for pager number during day hours 7:00am-4:30pm

## 2024-04-02 ENCOUNTER — Other Ambulatory Visit (HOSPITAL_COMMUNITY): Payer: Self-pay | Admitting: Internal Medicine

## 2024-04-02 ENCOUNTER — Emergency Department (HOSPITAL_COMMUNITY)

## 2024-04-02 ENCOUNTER — Other Ambulatory Visit: Payer: Self-pay

## 2024-04-02 ENCOUNTER — Inpatient Hospital Stay (HOSPITAL_COMMUNITY)
Admission: EM | Admit: 2024-04-02 | Discharge: 2024-04-04 | DRG: 350 | Disposition: A | Discharge status: Home / Self Care | Attending: Internal Medicine | Admitting: Internal Medicine

## 2024-04-02 DIAGNOSIS — I132 Hypertensive heart and chronic kidney disease with heart failure and with stage 5 chronic kidney disease, or end stage renal disease: Secondary | ICD-10-CM | POA: Diagnosis present

## 2024-04-02 DIAGNOSIS — F329 Major depressive disorder, single episode, unspecified: Secondary | ICD-10-CM | POA: Diagnosis present

## 2024-04-02 DIAGNOSIS — I5020 Unspecified systolic (congestive) heart failure: Secondary | ICD-10-CM | POA: Diagnosis present

## 2024-04-02 DIAGNOSIS — N182 Chronic kidney disease, stage 2 (mild): Secondary | ICD-10-CM | POA: Diagnosis not present

## 2024-04-02 DIAGNOSIS — Z808 Family history of malignant neoplasm of other organs or systems: Secondary | ICD-10-CM

## 2024-04-02 DIAGNOSIS — K4090 Unilateral inguinal hernia, without obstruction or gangrene, not specified as recurrent: Secondary | ICD-10-CM | POA: Diagnosis not present

## 2024-04-02 DIAGNOSIS — Z8249 Family history of ischemic heart disease and other diseases of the circulatory system: Secondary | ICD-10-CM | POA: Diagnosis not present

## 2024-04-02 DIAGNOSIS — I2510 Atherosclerotic heart disease of native coronary artery without angina pectoris: Secondary | ICD-10-CM | POA: Diagnosis not present

## 2024-04-02 DIAGNOSIS — F32A Depression, unspecified: Secondary | ICD-10-CM | POA: Diagnosis not present

## 2024-04-02 DIAGNOSIS — R7989 Other specified abnormal findings of blood chemistry: Secondary | ICD-10-CM | POA: Diagnosis present

## 2024-04-02 DIAGNOSIS — Z95810 Presence of automatic (implantable) cardiac defibrillator: Secondary | ICD-10-CM | POA: Diagnosis not present

## 2024-04-02 DIAGNOSIS — Z79890 Hormone replacement therapy (postmenopausal): Secondary | ICD-10-CM | POA: Diagnosis not present

## 2024-04-02 DIAGNOSIS — K4031 Unilateral inguinal hernia, with obstruction, without gangrene, recurrent: Secondary | ICD-10-CM | POA: Diagnosis not present

## 2024-04-02 DIAGNOSIS — Z723 Lack of physical exercise: Secondary | ICD-10-CM

## 2024-04-02 DIAGNOSIS — E890 Postprocedural hypothyroidism: Secondary | ICD-10-CM | POA: Diagnosis not present

## 2024-04-02 DIAGNOSIS — I2583 Coronary atherosclerosis due to lipid rich plaque: Secondary | ICD-10-CM

## 2024-04-02 DIAGNOSIS — Z91041 Radiographic dye allergy status: Secondary | ICD-10-CM | POA: Diagnosis not present

## 2024-04-02 DIAGNOSIS — Z9889 Other specified postprocedural states: Secondary | ICD-10-CM

## 2024-04-02 DIAGNOSIS — Z953 Presence of xenogenic heart valve: Secondary | ICD-10-CM | POA: Diagnosis not present

## 2024-04-02 DIAGNOSIS — Z7982 Long term (current) use of aspirin: Secondary | ICD-10-CM | POA: Diagnosis not present

## 2024-04-02 DIAGNOSIS — Z823 Family history of stroke: Secondary | ICD-10-CM

## 2024-04-02 DIAGNOSIS — E875 Hyperkalemia: Secondary | ICD-10-CM | POA: Diagnosis not present

## 2024-04-02 DIAGNOSIS — K4020 Bilateral inguinal hernia, without obstruction or gangrene, not specified as recurrent: Secondary | ICD-10-CM | POA: Diagnosis not present

## 2024-04-02 DIAGNOSIS — I5042 Chronic combined systolic (congestive) and diastolic (congestive) heart failure: Secondary | ICD-10-CM | POA: Diagnosis present

## 2024-04-02 DIAGNOSIS — Z79899 Other long term (current) drug therapy: Secondary | ICD-10-CM

## 2024-04-02 DIAGNOSIS — Z992 Dependence on renal dialysis: Secondary | ICD-10-CM | POA: Diagnosis not present

## 2024-04-02 DIAGNOSIS — K4030 Unilateral inguinal hernia, with obstruction, without gangrene, not specified as recurrent: Secondary | ICD-10-CM | POA: Diagnosis not present

## 2024-04-02 DIAGNOSIS — K56609 Unspecified intestinal obstruction, unspecified as to partial versus complete obstruction: Principal | ICD-10-CM

## 2024-04-02 DIAGNOSIS — N186 End stage renal disease: Secondary | ICD-10-CM | POA: Diagnosis not present

## 2024-04-02 DIAGNOSIS — F419 Anxiety disorder, unspecified: Secondary | ICD-10-CM | POA: Diagnosis present

## 2024-04-02 DIAGNOSIS — I428 Other cardiomyopathies: Secondary | ICD-10-CM | POA: Diagnosis not present

## 2024-04-02 DIAGNOSIS — E785 Hyperlipidemia, unspecified: Secondary | ICD-10-CM | POA: Diagnosis present

## 2024-04-02 DIAGNOSIS — Z885 Allergy status to narcotic agent status: Secondary | ICD-10-CM | POA: Diagnosis not present

## 2024-04-02 DIAGNOSIS — D696 Thrombocytopenia, unspecified: Secondary | ICD-10-CM | POA: Diagnosis present

## 2024-04-02 DIAGNOSIS — Z85850 Personal history of malignant neoplasm of thyroid: Secondary | ICD-10-CM

## 2024-04-02 DIAGNOSIS — D72829 Elevated white blood cell count, unspecified: Secondary | ICD-10-CM | POA: Diagnosis present

## 2024-04-02 DIAGNOSIS — Z8679 Personal history of other diseases of the circulatory system: Secondary | ICD-10-CM

## 2024-04-02 DIAGNOSIS — K219 Gastro-esophageal reflux disease without esophagitis: Secondary | ICD-10-CM | POA: Diagnosis present

## 2024-04-02 DIAGNOSIS — Z01810 Encounter for preprocedural cardiovascular examination: Secondary | ICD-10-CM

## 2024-04-02 DIAGNOSIS — K5669 Other partial intestinal obstruction: Secondary | ICD-10-CM | POA: Diagnosis not present

## 2024-04-02 DIAGNOSIS — K5730 Diverticulosis of large intestine without perforation or abscess without bleeding: Secondary | ICD-10-CM | POA: Diagnosis not present

## 2024-04-02 DIAGNOSIS — I5022 Chronic systolic (congestive) heart failure: Secondary | ICD-10-CM

## 2024-04-02 DIAGNOSIS — I10 Essential (primary) hypertension: Secondary | ICD-10-CM | POA: Diagnosis not present

## 2024-04-02 LAB — CBC
HCT: 46.2 % (ref 39.0–52.0)
Hemoglobin: 15.2 g/dL (ref 13.0–17.0)
MCH: 31.5 pg (ref 26.0–34.0)
MCHC: 32.9 g/dL (ref 30.0–36.0)
MCV: 95.7 fL (ref 80.0–100.0)
Platelets: 119 K/uL — ABNORMAL LOW (ref 150–400)
RBC: 4.83 MIL/uL (ref 4.22–5.81)
RDW: 14 % (ref 11.5–15.5)
WBC: 5.5 K/uL (ref 4.0–10.5)
nRBC: 0 % (ref 0.0–0.2)

## 2024-04-02 LAB — COMPREHENSIVE METABOLIC PANEL WITH GFR
ALT: 19 U/L (ref 0–44)
AST: 22 U/L (ref 15–41)
Albumin: 3.5 g/dL (ref 3.5–5.0)
Alkaline Phosphatase: 35 U/L — ABNORMAL LOW (ref 38–126)
Anion gap: 12 (ref 5–15)
BUN: 23 mg/dL (ref 8–23)
CO2: 21 mmol/L — ABNORMAL LOW (ref 22–32)
Calcium: 9 mg/dL (ref 8.9–10.3)
Chloride: 107 mmol/L (ref 98–111)
Creatinine, Ser: 1.19 mg/dL (ref 0.61–1.24)
GFR, Estimated: >60 mL/min (ref >60)
Glucose, Bld: 120 mg/dL — ABNORMAL HIGH (ref 70–99)
Potassium: 3.8 mmol/L (ref 3.5–5.1)
Sodium: 140 mmol/L (ref 135–145)
Total Bilirubin: 0.9 mg/dL (ref 0.0–1.2)
Total Protein: 6.2 g/dL — ABNORMAL LOW (ref 6.5–8.1)

## 2024-04-02 LAB — URINALYSIS, ROUTINE W REFLEX MICROSCOPIC
Bilirubin Urine: NEGATIVE
Glucose, UA: >=500 mg/dL — AB
Hgb urine dipstick: NEGATIVE
Ketones, ur: 20 mg/dL — AB
Leukocytes,Ua: NEGATIVE
Nitrite: NEGATIVE
Protein, ur: NEGATIVE mg/dL
Specific Gravity, Urine: 1.029 (ref 1.005–1.030)
pH: 5 (ref 5.0–8.0)

## 2024-04-02 LAB — TROPONIN I (HIGH SENSITIVITY)
Troponin I (High Sensitivity): 39 ng/L — ABNORMAL HIGH (ref <18)
Troponin I (High Sensitivity): 44 ng/L — ABNORMAL HIGH (ref <18)

## 2024-04-02 LAB — LIPASE, BLOOD: Lipase: 24 U/L (ref 11–51)

## 2024-04-02 LAB — I-STAT CG4 LACTIC ACID, ED: Lactic Acid, Venous: 1 mmol/L (ref 0.5–1.9)

## 2024-04-02 MED ORDER — MEXILETINE HCL 150 MG PO CAPS
300.0000 mg | ORAL_CAPSULE | Freq: Two times a day (BID) | ORAL | Status: DC
  Administered 2024-04-02 – 2024-04-04 (×4): 300 mg via ORAL
  Filled 2024-04-02 (×4): qty 2

## 2024-04-02 MED ORDER — ATORVASTATIN CALCIUM 40 MG PO TABS
40.0000 mg | ORAL_TABLET | Freq: Every day | ORAL | Status: DC
  Administered 2024-04-02 – 2024-04-03 (×2): 40 mg via ORAL
  Filled 2024-04-02 (×2): qty 1

## 2024-04-02 MED ORDER — PANTOPRAZOLE SODIUM 40 MG PO TBEC
40.0000 mg | DELAYED_RELEASE_TABLET | Freq: Every day | ORAL | Status: DC
  Administered 2024-04-02 – 2024-04-04 (×3): 40 mg via ORAL
  Filled 2024-04-02 (×3): qty 1

## 2024-04-02 MED ORDER — ARIPIPRAZOLE 10 MG PO TABS
5.0000 mg | ORAL_TABLET | Freq: Every day | ORAL | Status: DC
  Administered 2024-04-02 – 2024-04-03 (×2): 5 mg via ORAL
  Filled 2024-04-02 (×2): qty 1

## 2024-04-02 MED ORDER — SERTRALINE HCL 50 MG PO TABS
50.0000 mg | ORAL_TABLET | Freq: Every day | ORAL | Status: DC
  Administered 2024-04-02 – 2024-04-04 (×3): 50 mg via ORAL
  Filled 2024-04-02 (×3): qty 1

## 2024-04-02 MED ORDER — ACETAMINOPHEN 500 MG PO TABS
1000.0000 mg | ORAL_TABLET | Freq: Once | ORAL | Status: AC
  Administered 2024-04-02: 1000 mg via ORAL
  Filled 2024-04-02: qty 2

## 2024-04-02 MED ORDER — LORAZEPAM 2 MG/ML IJ SOLN
1.0000 mg | Freq: Once | INTRAMUSCULAR | Status: AC
  Administered 2024-04-02: 1 mg via INTRAVENOUS
  Filled 2024-04-02: qty 1

## 2024-04-02 MED ORDER — ACETAMINOPHEN 650 MG RE SUPP
650.0000 mg | Freq: Four times a day (QID) | RECTAL | Status: DC | PRN
Start: 2024-04-02 — End: 2024-04-03

## 2024-04-02 MED ORDER — LACTATED RINGERS IV BOLUS
1000.0000 mL | Freq: Once | INTRAVENOUS | Status: AC
  Administered 2024-04-02: 1000 mL via INTRAVENOUS

## 2024-04-02 MED ORDER — LEVOTHYROXINE SODIUM 75 MCG PO TABS
150.0000 ug | ORAL_TABLET | Freq: Every day | ORAL | Status: DC
  Administered 2024-04-04: 150 ug via ORAL
  Filled 2024-04-02: qty 2

## 2024-04-02 MED ORDER — RANOLAZINE ER 500 MG PO TB12
500.0000 mg | ORAL_TABLET | Freq: Two times a day (BID) | ORAL | Status: DC
  Administered 2024-04-02 – 2024-04-04 (×4): 500 mg via ORAL
  Filled 2024-04-02 (×4): qty 1

## 2024-04-02 MED ORDER — MORPHINE SULFATE (PF) 2 MG/ML IV SOLN
2.0000 mg | INTRAVENOUS | Status: DC | PRN
  Administered 2024-04-03: 2 mg via INTRAVENOUS
  Filled 2024-04-02: qty 1

## 2024-04-02 MED ORDER — TRIMETHOBENZAMIDE HCL 100 MG/ML IM SOLN: 200.0000 mg | Freq: Four times a day (QID) | INTRAMUSCULAR | Status: DC | PRN

## 2024-04-02 MED ORDER — ALBUTEROL SULFATE (2.5 MG/3ML) 0.083% IN NEBU: 2.5000 mg | INHALATION_SOLUTION | Freq: Four times a day (QID) | RESPIRATORY_TRACT | Status: DC | PRN

## 2024-04-02 MED ORDER — DOXAZOSIN MESYLATE 1 MG PO TABS
1.0000 mg | ORAL_TABLET | Freq: Every day | ORAL | Status: DC
  Administered 2024-04-02 – 2024-04-03 (×2): 1 mg via ORAL
  Filled 2024-04-02 (×3): qty 1

## 2024-04-02 MED ORDER — SODIUM CHLORIDE 0.9 % IV SOLN: INTRAVENOUS | Status: DC

## 2024-04-02 MED ORDER — ENOXAPARIN SODIUM 40 MG/0.4ML IJ SOSY
40.0000 mg | PREFILLED_SYRINGE | INTRAMUSCULAR | Status: DC
  Administered 2024-04-02 – 2024-04-03 (×2): 40 mg via SUBCUTANEOUS
  Filled 2024-04-02 (×2): qty 0.4

## 2024-04-02 MED ORDER — FUROSEMIDE 20 MG PO TABS
20.0000 mg | ORAL_TABLET | ORAL | Status: DC
  Filled 2024-04-02: qty 1

## 2024-04-02 MED ORDER — ACETAMINOPHEN 325 MG PO TABS: 650.0000 mg | ORAL_TABLET | Freq: Four times a day (QID) | ORAL | Status: DC | PRN

## 2024-04-02 MED ORDER — CLONAZEPAM 0.5 MG PO TABS: 0.5000 mg | ORAL_TABLET | Freq: Every day | ORAL | Status: DC | PRN

## 2024-04-02 MED ORDER — SODIUM CHLORIDE 0.9% FLUSH
3.0000 mL | Freq: Two times a day (BID) | INTRAVENOUS | Status: DC
  Administered 2024-04-02 – 2024-04-04 (×5): 3 mL via INTRAVENOUS

## 2024-04-02 NOTE — Consult Note (Addendum)
 Cardiology Consultation   Patient ID: Bradley [name]. MRN: [id number]; DOB: August 30, 1955  Admit date: 04/02/2024 Date of Consult: 04/02/2024  PCP:  Micheal Wolm ORN, MD   [facility] Providers Cardiologist:  Vina Gull, MD  Electrophysiologist:  Will Gladis Norton, MD  Advanced Heart Failure:  Toribio Fuel, MD       Patient Profile: Bradley [name]. is a 68 y.o. male with a hx of thyroid  cancer s/p thyroidectomy, hypertension, depression, SBE, NICM s/p Abbott ICD and congenital AS s/p Bentall procedure who is being seen 04/02/2024 for preop clearance prior to inguinal hernia repair at the request of Dr. Lyndel.  History of Present Illness: Mr. Gurka is a 68 year old male with past medical history of thyroid  cancer s/p thyroidectomy, hypertension, depression, SBE, NICM s/p Abbott ICD and congenital AS s/p aortic valvulotomy at age 37 with subsequent Bentall procedure in 2001 with pericardial tissue valve.  He had MSSA bacteremia and endocarditis in February 2015 complicated by renal failure requiring short-term dialysis.  He had sternoclavicular osteomyelitis and had a perivalvular abscess in March 2015 requiring redo Bentall procedure and debridement of the root abscess.  He was admitted with monomorphic VT in May 2016, cardiac catheterization at the time showed normal coronary arteries.  He had a St Jude dual-chamber ICD implanted.  EF was 30 to 35% at time.  Over the years, ejection fraction has been as high as 40% and as low as 20%.  CPX study performed in September 2024 showed severe functional limitation when compared to matched sedentary norm, moderate heart failure limitation with contributing mild restrictive lung limitation.  Moderate chronotropic incompetence.  Last echocardiogram obtained on 06/26/2023 showed EF 20 to 25%, mild LVH, grade 1 DD, trivial MR, replaced aortic valve with trivial AI.  Last cardiac catheterization performed on 07/09/2023 showed a 95%  ostial to proximal RCA narrowing, cardiac output 4.7, cardiac index 2.2, RV pressure 15/2.  Procedure was complicated by apparent dissection/wire perforation of the right radial artery with resultant occlusion of the artery.  Coronary artery disease was managed medically.  Patient was last seen by Dr. Fuel on 11/30/2023 at which time he was doing well and was able to walk 1/4 mile without stopping and handle a flight of steps.  He was anticipating hernia repair at some point and Dr. Fuel felt patient was a moderate risk candidate for the proposed procedure given low EF but good functional capacity.  He has been on Farxiga , Entresto  and carvedilol  at home.  He cannot tolerate spironolactone  due to hyperkalemia.  Patient did undergo ICD device change out in July 2025 after reaching ERI, he received a new Abbott ICD.  Patient was recently admitted to the hospital from 9/1 - 9/2 due to small bowel obstruction secondary to incarcerated right inguinal hernia.  He was managed medically, eventually symptom improved enough that he was discharged on 9/2.  Unfortunately, he returned back to the hospital 9 hours later with recurrent nausea and vomiting.  He was seen by general surgery service who wished to proceed with inguinal hernia surgery during this hospitalization.  Cardiology service consulted for preop clearance.   Past Medical History:  Diagnosis Date   Anxiety    Aortic stenosis    s/p Bentall with bioprosthetic AVR 02/2010; Last echo (9/11): Moderate LVH, EF 45-50%, AVR functioning appropriately, aortic valve mean gradient 21, diastolic dysfunction. Chest MRA (2/13): Mild to moderate dilatation at the sinus of Valsalva at 4.1 cm, mild dilatation ascending aorta distal  to the tube graft at 3.9 cm, moderate dilatation of the innominate artery a 2.1 cm;     CHF (congestive heart failure) (HCC)    Depression    ESRD on dialysis (HCC)    a. 09/2013 felt to be related to gentamycin b. no longer requiring  HD   Hx of cardiac cath    a. LHC in 02/2010: normal cors   Hx of echocardiogram 02/2014   Echo (8/15):  Mod LV, EF 35-40%, Gr 1 DD, AVR ok (mean 14 mmHg), mild LAE   Hypertension    Hypothyroidism, postsurgical    Prosthetic valve endocarditis    Staphylococcus aureus bacteremia    Thyroid  cancer (HCC)    Ventricular tachycardia (HCC)    a. s/p STJ ICD    Past Surgical History:  Procedure Laterality Date   AORTIC VALVE REPAIR  1968   AORTIC VALVE REPLACEMENT  2011   AV FISTULA PLACEMENT Left 10/08/2013   Procedure: ARTERIOVENOUS (AV) FISTULA CREATION- LEFT ARM; Radial Cephalic ;  Surgeon: Lynwood JONETTA Collum, MD;  Location: [facility] OR;  Service: Vascular;  Laterality: Left;   BENTALL PROCEDURE N/A 10/28/2013   Procedure: REDO BENTALL PROCEDURE, debridment of aoritc root abscess, replacement of aortic root, ascending aorta and aortic valve with homograft. Insertion of left femoral arterial line;  Surgeon: Dallas KATHEE Jude, MD;  Location: [facility] OR;  Service: Open Heart Surgery;  Laterality: N/A;   BIOPSY  07/22/2019   Procedure: BIOPSY;  Surgeon: Legrand Victory LITTIE DOUGLAS, MD;  Location: [facility];  Service: Gastroenterology;;   CARDIAC CATHETERIZATION  03/02/2010   NORMAL CORONARY ARTERY   CARDIAC CATHETERIZATION N/A 01/01/2015   Procedure: Coronary/Graft Angiography;  Surgeon: Debby DELENA Sor, MD;  Location: [facility] INVASIVE CV LAB;  Service: Cardiovascular;  Laterality: N/A;   CARDIAC VALVE REPLACEMENT     COLONOSCOPY WITH PROPOFOL  N/A 07/22/2019   Procedure: COLONOSCOPY WITH PROPOFOL ;  Surgeon: Legrand Victory LITTIE DOUGLAS, MD;  Location: [facility];  Service: Gastroenterology;  Laterality: N/A;   EP IMPLANTABLE DEVICE N/A 01/01/2015   Procedure: ICD Implant;  Surgeon: Elspeth JAYSON Sage, MD;  Location: [facility] INVASIVE CV LAB;  Service: Cardiovascular;  Laterality: N/A;   HEMOSTASIS CLIP PLACEMENT  07/22/2019   Procedure: HEMOSTASIS CLIP PLACEMENT;  Surgeon: Legrand Victory LITTIE DOUGLAS, MD;  Location: [facility];  Service:  Gastroenterology;;   ICD GENERATOR CHANGEOUT N/A 02/11/2024   Procedure: ICD GENERATOR CHANGEOUT;  Surgeon: Inocencio Soyla Lunger, MD;  Location: [facility] INVASIVE CV LAB;  Service: Cardiovascular;  Laterality: N/A;   INTRAOPERATIVE TRANSESOPHAGEAL ECHOCARDIOGRAM N/A 10/28/2013   Procedure: INTRAOPERATIVE TRANSESOPHAGEAL ECHOCARDIOGRAM;  Surgeon: Dallas KATHEE Jude, MD;  Location: [facility] OR;  Service: Open Heart Surgery;  Laterality: N/A;   PERIPHERAL VASCULAR CATHETERIZATION N/A 01/01/2015   Procedure: Aortic Arch Angiography;  Surgeon: Debby DELENA Sor, MD;  Location: [facility] INVASIVE CV LAB;  Service: Cardiovascular;  Laterality: N/A;   POLYPECTOMY  07/22/2019   Procedure: POLYPECTOMY;  Surgeon: Legrand Victory LITTIE DOUGLAS, MD;  Location: [facility];  Service: Gastroenterology;;   RIGHT/LEFT HEART CATH AND CORONARY ANGIOGRAPHY N/A 07/09/2023   Procedure: RIGHT/LEFT HEART CATH AND CORONARY ANGIOGRAPHY;  Surgeon: Cherrie Toribio SAUNDERS, MD;  Location: [facility] INVASIVE CV LAB;  Service: Cardiovascular;  Laterality: N/A;   SHOULDER ARTHROSCOPY W/ ROTATOR CUFF REPAIR Right 2012   STERNOTOMY     REDO   TEE WITHOUT CARDIOVERSION N/A 10/24/2013   Procedure: TRANSESOPHAGEAL ECHOCARDIOGRAM (TEE);  Surgeon: Leim VEAR Moose, MD;  Location: [facility]COPY;  Service: Cardiovascular;  Laterality:  N/A;   THYROIDECTOMY  ~ 2005   TRANSTHORACIC ECHOCARDIOGRAM  03/2010   SHOWED MILD REDUCTION OF LV FUNCTION     Home Medications:  Prior to Admission medications   Medication Sig Start Date End Date Taking? Authorizing Provider  amoxicillin (AMOXIL) 500 MG capsule SMARTSIG:4 Capsule(s) By Mouth 06/15/23   [provider]  ARIPiprazole  (ABILIFY ) 5 MG tablet Take 1 tablet nightly Patient taking differently: Take 5 mg by mouth at bedtime. 03/07/24   Burchette, Wolm ORN, MD  aspirin  EC 81 MG tablet Take 81 mg by mouth at bedtime.     [provider]  atorvastatin  (LIPITOR) 40 MG tablet TAKE 1 TABLET BY MOUTH EVERY DAY Patient taking  differently: Take 40 mg by mouth at bedtime. 03/13/24   Burchette, Wolm ORN, MD  carvedilol  (COREG ) 6.25 MG tablet TAKE 1 TABLET BY MOUTH TWICE A DAY WITH FOOD 11/09/23   Bensimhon, Toribio SAUNDERS, MD  clonazePAM  (KLONOPIN ) 1 MG tablet Take 0.5 mg by mouth daily as needed for anxiety.    [provider]  docusate sodium  (COLACE) 100 MG capsule Take 100 mg by mouth 3 (three) times a week.    [provider]  doxazosin  (CARDURA ) 1 MG tablet TAKE 1 TABLET (1 MG TOTAL) BY MOUTH DAILY. FOR SBP GREATER THAN 140 Patient taking differently: Take 1 mg by mouth daily as needed (only taking when SBP is greater than 140). 01/04/24   Bensimhon, Toribio SAUNDERS, MD  ENTRESTO  97-103 MG TAKE 1 TABLET BY MOUTH TWICE A DAY 01/14/24   Bensimhon, Toribio SAUNDERS, MD  FARXIGA  10 MG TABS tablet TAKE 1 TABLET BY MOUTH EVERY DAY 03/19/23   Bensimhon, Toribio SAUNDERS, MD  furosemide  (LASIX ) 20 MG tablet TAKE 1 TABLET BY MOUTH 4 TIMES A WEEK. Patient taking differently: Take 20 mg by mouth 3 (three) times a week. Monday, Thursday, and Saturday 01/14/24   Bensimhon, Toribio SAUNDERS, MD  levothyroxine  (SYNTHROID ) 150 MCG tablet TAKE 1 TABLET BY MOUTH EVERY DAY 03/19/24   Burchette, Wolm ORN, MD  mexiletine (MEXITIL ) 150 MG capsule TAKE 2 CAPSULES BY MOUTH EVERY MORNING AND 2 CAPSULES BY MOUTH EVERY EVENING. 03/24/24   Bensimhon, Toribio SAUNDERS, MD  Multiple Vitamins-Minerals (AIRBORNE) CHEW Chew 1 tablet by mouth at bedtime.    [provider]  omeprazole  (PRILOSEC) 20 MG capsule Take 20 mg by mouth daily as needed (Stomach).    [provider]  ranolazine  (RANEXA ) 500 MG 12 hr tablet TAKE 1 TABLET BY MOUTH TWICE A DAY 02/04/24   Bensimhon, Toribio SAUNDERS, MD  sertraline  (ZOLOFT ) 50 MG tablet TAKE 1 TABLET BY MOUTH EVERY DAY 02/04/24   Burchette, Wolm ORN, MD    Scheduled Meds:  enoxaparin  (LOVENOX ) injection  40 mg Subcutaneous Q24H   sodium chloride  flush  3 mL Intravenous Q12H   Continuous Infusions:  sodium chloride  40 mL/hr at 04/02/24 0936    PRN Meds: acetaminophen  **OR** acetaminophen , albuterol , morphine  injection, trimethobenzamide   Allergies:    Allergies  Allergen Reactions   Oxycodone  Other (See Comments)    Gives patient nightmares   Iodinated Contrast Media Rash   Rifampin  Nausea Only    Social History:   Social History   Socioeconomic History   Marital status: Married    Spouse name: Not on file   Number of children: Not on file   Years of education: Not on file   Highest education level: Not on file  Occupational History   Not on file  Tobacco Use  Smoking status: Never   Smokeless tobacco: Never  Vaping Use   Vaping status: Never Used  Substance and Sexual Activity   Alcohol use: No    Alcohol/week: 0.0 standard drinks of alcohol    Comment: Occasional   Drug use: No   Sexual activity: Never  Other Topics Concern   Not on file  Social History Narrative   ** Merged History Encounter **       Lives at home with walker.  Does not drive.  ESRD T, H, Sa.  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   Social Drivers of Health   Financial Resource Strain: Low Risk  (04/05/2022)   Overall Financial Resource Strain (CARDIA)    Difficulty of Paying Living Expenses: Not very hard  Food Insecurity: No Food Insecurity (03/31/2024)   Hunger Vital Sign    Worried About Running Out of Food in the Last Year: Never true    Ran Out of Food in the Last Year: Never true  Transportation Needs: No Transportation Needs (03/31/2024)   PRAPARE - Administrator, Civil Service (Medical): No    Lack of Transportation (Non-Medical): No  Physical Activity: Inactive (10/17/2021)   Exercise Vital Sign    Days of Exercise per Week: 0 days    Minutes of Exercise per Session: 0 min  Stress: No Stress Concern Present (10/17/2021)   [employer] of Occupational Health - Occupational Stress Questionnaire    Feeling of Stress : Not at all  Social Connections: Moderately Isolated (03/31/2024)   Social Connection and  Isolation Panel    Frequency of Communication with Friends and Family: More than three times a week    Frequency of Social Gatherings with Friends and Family: More than three times a week    Attends Religious Services: Never    Database administrator or Organizations: No    Attends Banker Meetings: Never    Marital Status: Married  Catering manager Violence: Not At Risk (03/31/2024)   Humiliation, Afraid, Rape, and Kick questionnaire    Fear of Current or Ex-Partner: No    Emotionally Abused: No    Physically Abused: No    Sexually Abused: No    Family History:    Family History  Problem Relation Age of Onset   Hypertension Mother    Heart disease Father    Stroke Father    Hypertension Father    Thyroid  cancer Sister    Thyroid  cancer Sister    Kidney Stones Sister    Heart attack Neg Hx    Colon cancer Neg Hx    Colon polyps Neg Hx    Esophageal cancer Neg Hx    Stomach cancer Neg Hx    Rectal cancer Neg Hx      ROS:  Please see the history of present illness.   All other ROS reviewed and negative.     Physical Exam/Data: Vitals:   04/02/24 0645 04/02/24 0900 04/02/24 0906 04/02/24 0941  BP: 100/76 115/80    Pulse: 91 89    Resp: 13 12    Temp:    98 F (36.7 C)  TempSrc:    Oral  SpO2: 96% 97% 98%   Weight:      Height:       No intake or output data in the 24 hours ending 04/02/24 1043    04/02/2024   12:36 AM 03/31/2024    8:56 PM 02/11/2024   10:18 AM  Last 3  Weights  Weight (lbs) 199 lb 194 lb 6.4 oz 198 lb  Weight (kg) 90.266 kg 88.179 kg 89.812 kg     Body mass index is 26.25 kg/m.  General:  Well nourished, well developed, in no acute distress HEENT: normal Neck: no JVD Vascular: No carotid bruits; Distal pulses 2+ bilaterally Cardiac:  normal S1, S2; RRR. 2/6 murmur  Lungs:  clear to auscultation bilaterally, no wheezing, rhonchi or rales  Abd: soft, nontender, no hepatomegaly  Ext: no edema Musculoskeletal:  No deformities, BUE  and BLE strength normal and equal Skin: warm and dry  Neuro:  CNs 2-12 intact, no focal abnormalities noted Psych:  Normal affect   EKG:  The EKG was personally reviewed and demonstrates: Sinus tachycardia with right bundle branch block. Telemetry:  Telemetry was personally reviewed and demonstrates: Sinus rhythm, no significant ventricular ectopy, occasional PVCs  Relevant CV Studies:  Echo 06/26/2023 1. Left ventricular ejection fraction, by estimation, is 20 to 25%. The  left ventricle has severely decreased function. Left ventricular  endocardial border not optimally defined to evaluate regional wall motion.  There is mild left ventricular  hypertrophy. Left ventricular diastolic parameters are consistent with  Grade I diastolic dysfunction (impaired relaxation).   2. Right ventricular systolic function is mildly reduced. The right  ventricular size is mildly enlarged. Tricuspid regurgitation signal is  inadequate for assessing PA pressure.   3. Left atrial size was mildly dilated.   4. Right atrial size was mildly dilated.   5. The mitral valve is grossly normal. Trivial mitral valve  regurgitation.   6. The aortic valve has been repaired/replaced. Aortic valve  regurgitation is trivial. There is a 23 mm valve present in the aortic  position. Aortic valve prosthesis not evaluated.   7. Aortic root/ascending aorta has been repaired/replaced.    Laboratory Data: High Sensitivity Troponin:   Recent Labs  Lab 04/02/24 0043 04/02/24 0320  TROPONINIHS 39* 44*     Chemistry Recent Labs  Lab 03/31/24 1640 04/02/24 0043  NA 144 140  K 4.1 3.8  CL 109 107  CO2 19* 21*  GLUCOSE 140* 120*  BUN 20 23  CREATININE 1.24 1.19  CALCIUM  9.5 9.0  GFRNONAA >60 >60  ANIONGAP 16* 12    Recent Labs  Lab 03/31/24 1640 04/02/24 0043  PROT 6.7 6.2*  ALBUMIN  3.8 3.5  AST 22 22  ALT 19 19  ALKPHOS 47 35*  BILITOT 1.2 0.9   Lipids No results for input(s): CHOL, TRIG,  HDL, LABVLDL, LDLCALC, CHOLHDL in the last 168 hours.  Hematology Recent Labs  Lab 03/31/24 1051 04/02/24 0043  WBC 18.7* 5.5  RBC 6.02* 4.83  HGB 18.7* 15.2  HCT 57.1* 46.2  MCV 94.9 95.7  MCH 31.1 31.5  MCHC 32.7 32.9  RDW 14.1 14.0  PLT 162 119*   Thyroid  No results for input(s): TSH, FREET4 in the last 168 hours.  BNPNo results for input(s): BNP, PROBNP in the last 168 hours.  DDimer No results for input(s): DDIMER in the last 168 hours.  Radiology/Studies:  CT ABDOMEN PELVIS WO CONTRAST Result Date: 04/02/2024 CLINICAL DATA:  Acute nonlocalized abdominal pain EXAM: CT ABDOMEN AND PELVIS WITHOUT CONTRAST TECHNIQUE: Multidetector CT imaging of the abdomen and pelvis was performed following the standard protocol without IV contrast. RADIATION DOSE REDUCTION: This exam was performed according to the departmental dose-optimization program which includes automated exposure control, adjustment of the mA and/or kV according to patient size and/or use of iterative reconstruction  technique. COMPARISON:  03/31/2024 FINDINGS: Lower chest: No acute abnormality. Hepatobiliary: No focal liver abnormality is seen. No gallstones, gallbladder wall thickening, or biliary dilatation. Pancreas: Unremarkable Spleen: Unremarkable Adrenals/Urinary Tract: Adrenal glands are unremarkable. Simple cortical cysts are seen within the kidneys bilaterally for which no follow-up imaging is recommended. The kidneys are otherwise unremarkable. Bladder unremarkable. Stomach/Bowel: Dilated gas and fluid-filled loops of small bowel are again identified involving the proximal in mid small bowel with loops of bowel measuring up to 4.5 cm in diameter. Similar prior examination, there is a notable caliber change involving the small bowel as it passes into the large right hernia sac in keeping with the point of transition for the small-bowel obstruction. Similar prior examination, the hernia sac contains multiple  loops of unremarkable small bowel as well as a small amount of free fluid. Mild descending colonic diverticulosis. The stomach, large, and small bowel are otherwise unremarkable. The appendix is not visualized and may be absent. No free intraperitoneal gas. Vascular/Lymphatic: Aortic atherosclerosis. No enlarged abdominal or pelvic lymph nodes. Reproductive: Mild prostatic hypertrophy Other: Large right and small left inguinal hernias are present. Musculoskeletal: No acute bone abnormality. No lytic or blastic bone lesion. Osseous structures are age appropriate. IMPRESSION: 1. Persistent small bowel obstruction with point of transition involving the small bowel as it passes into the large right inguinal hernia sac. 2. Mild descending colonic diverticulosis. 3. Large right and small left inguinal hernias. 4. Mild prostatic hypertrophy. 5. Aortic atherosclerosis. Aortic Atherosclerosis (ICD10-I70.0). Electronically Signed   By: Dorethia Molt M.D.   On: 04/02/2024 03:56   DG Abd Portable 1 View Result Date: 03/31/2024 EXAM: 1 VIEW XRAY OF THE ABDOMEN 03/31/2024 02:29:00 PM COMPARISON: None available. CLINICAL HISTORY: Post-NG. Reason for exam: Post-NG; Per triage notes: Pt BIB EMS from home for N/V starting this morning and complaints of being cold. Initial oral temp with EMS 94.2 but repeat was 97. Pt has a defibrillator, battery replaced 2 months ago. Hx of previous heart valve infection, redone in 2015. AOX4. FINDINGS: BOWEL: Partially visualized distended small bowel loops in the upper abdomen. SOFT TISSUES: No opaque urinary calculi. Left chest wall single lead cardiac defibrillator noted. BONES: No acute osseous abnormality. LINES AND TUBES: Enteric tube in place with distal tip and side port in the proximal stomach. IMPRESSION: 1. Enteric tube in place with distal tip and side port in the proximal stomach. 2. Partially visualized distended small bowel loops in the upper abdomen, compatible with distal small  bowel obstruction seen on CT from earlier today. Electronically signed by: Selinda Blue MD 03/31/2024 03:00 PM EDT RP Workstation: [id number]   CT ABDOMEN PELVIS WO CONTRAST Result Date: 03/31/2024 EXAM: CT ABDOMEN AND PELVIS WITHOUT CONTRAST 03/31/2024 12:59:00 PM TECHNIQUE: CT of the abdomen and pelvis was performed without the administration of intravenous contrast. Multiplanar reformatted images are provided for review. Automated exposure control, iterative reconstruction, and/or weight-based adjustment of the mA/kV was utilized to reduce the radiation dose to as low as reasonably achievable. COMPARISON: 03/16/23 CT abdomen/pelvis CLINICAL HISTORY: Abd pain, vomiting. No contrast; Pt BIB EMS from home for N/V starting this morning and complaints of being cold. Initial oral temp with EMS 94.2 but repeat was 97. Pt has a defibrillator, battery replaced 2 months ago. Hx of previous heart valve infection, redone in 2015. AOX4. FINDINGS: LOWER CHEST: Intact lower sternotomy wires. ICD lead indeterminant in the right ventricular apex. Very middle lobe 4 mm pulmonary nodule on series 5/image 6 is unchanged from 10/23/13  chest CT and considered benign. No acute abnormality of the lung bases. Fluid in the lower thoracic esophagus. LIVER: Chronic small coarse dystrophic calcification in the posterior right liver. Otherwise normal liver with no liver masses. GALLBLADDER AND BILE DUCTS: No wall thickening. No cholelithiasis. No biliary ductal dilatation. SPLEEN: Normal size. No focal lesion. PANCREAS: No mass. No ductal dilatation. ADRENAL GLANDS: Stable 1.3 cm left adrenal nodule with density negative for HU compatible with an adenoma. KIDNEYS, URETERS AND BLADDER: Scattered small simple bilateral renal cysts, largest 2.5 cm in the lateral upper right kidney, for which no follow up imaging is recommended. No stones in the kidneys or ureters. No hydronephrosis. No perinephric or periureteral stranding. Normal nondistended  bladder. GI AND BOWEL: Large right inguinal hernia containing multiple small bowel loops. Diffuse dilatation of the small bowel up to 4 cm diameter proximal to the right inguinal hernia, with diffuse small bowel air-fluid levels compatible with small bowel obstruction. Small amount of free fluid in the right inguinal hernia sac. Appendix not discretely visualized. Left colonic diverticulosis with no large bowel wall thickening or significant pericolonic fat stranding. No definite pneumatosis or bowel wall thickening. PERITONEUM AND RETROPERITONEUM: Trace right-sided ascites. No focal fluid collection. VASCULATURE: Atherosclerotic nonaneurysmal abdominal aorta. LYMPH NODES: No lymphadenopathy. REPRODUCTIVE ORGANS: Chronic mild prostatomegaly. BONES AND SOFT TISSUES: Moderate thoracolumbar spondylosis. No acute osseous abnormality. No focal soft tissue abnormality. IMPRESSION: 1. High-grade distal small bowel obstruction at the level of the large right inguinal hernia containing multiple distal small bowel loops. 2. Trace right-sided ascites and small volume fluid within the right inguinal hernia sac. No free air or definite pneumatosis. 3. Mild left colonic diverticulosis. 4. Stable small left adrenal adenoma, for which no follow-up imaging is recommended. Electronically signed by: Selinda Blue MD 03/31/2024 01:38 PM EDT RP Workstation: [id number]     Assessment and Plan: Preoperative clearance  - Patient will be at least moderate risk candidate for the upcoming procedure primarily due to history of low ejection fraction.  He denies any chest pain and has great functional ability.  Family says [place] was [place].  He has no problem walking 2 blocks away from home and back without any issue.  He was previously seen by Dr. Cherrie in early May 2025 and was felt to be at acceptable risk for the same procedure.  Although the previous cardiac  catheterization in December 2024 that showed 95% ostial RCA lesion, however given lack of any angina, there is no clinical indication to have it fixed.  Since patient has failed conservative management for incarcerated hernia and SBO, I do not think the surgery can be avoided. He appears to be euvolemic on exam and well compensated from the heart failure perspective. - No further workup is needed prior to the surgery, he is at acceptable risk to proceed from the cardiac perspective  Incarcerated right inguinal hernia with SBO: Failed conservative management as patient bounced right back to the hospital within 9 hours after discharge.  Seen by general surgery service who plan to proceed with surgery during this admission.  Nonischemic cardiomyopathy: Ejection fraction has been low since 2015, over the years, EF has been as low as 20 to 25% and as high as 40 to 45%. Last cardiac catheterization performed on 07/08/2024 showed a 95% ostial RCA lesion, otherwise clean coronary arteries.  He was managed medically.  Home medication held due to nausea vomiting.  On carvedilol ,  Farxiga , and Entresto  at home, these can be resumed once able to tolerate oral medication.  Not on spironolactone  at home due to history of hyperkalemia.  He is euvolemic on exam and has no lower extremity edema, lungs clear, no significant JVD.  History of VT s/p Abbott ICD: Initially underwent [employer] Jude dual-chamber ICD in 2016, had a generator change out recently in July 2025.  Most recent device interrogation on 03/26/2024 showed normal device function, no significant arrhythmia.  Continue mexiletine and Ranexa  once able to tolerate oral medication  History of Bentall procedure with bioprosthetic aortic valve: Stable on last echocardiogram in November 2024  CAD: Last cardiac catheterization in December 2024 showed 95% ostial RCA lesion.  This was treated medically as the patient was asymptomatic.   Risk Assessment/Risk Scores:       [place]  Heart Association (NYHA) Functional Class NYHA Class I       For questions or updates, please contact [facility] Please consult [email or web address] for contact info under    Bonney Scot Ford, [place]  04/02/2024 10:43 AM

## 2024-04-02 NOTE — Anesthesia Preprocedure Evaluation (Signed)
 Anesthesia Evaluation  Patient identified by MRN, date of birth, ID band Patient awake    Reviewed: Allergy & Precautions, NPO status , Patient's Chart, lab work & pertinent test results, reviewed documented beta blocker date and time   History of Anesthesia Complications Negative for: history of anesthetic complications  Airway Mallampati: II  TM Distance: >3 FB Neck ROM: Full    Dental  (+) Dental Advisory Given, Teeth Intact   Pulmonary neg pulmonary ROS   Pulmonary exam normal        Cardiovascular hypertension, Pt. on home beta blockers and Pt. on medications + CAD and +CHF  Normal cardiovascular exam+ dysrhythmias Ventricular Tachycardia + Cardiac Defibrillator + Valvular Problems/Murmurs    S/p Bentall w/ bioprosthetic AVR 2011 w/ redo AVR 2015 d/t endocarditis  '24 Cath - Assessment: 1. Primarily NICM with normal left coronary system 2. Apparent high-grade narrowing at right coronary button with adequate flow down RCA 3. Low filling pressures with mild to moderately reduced cardiac output  '24 TTE - EF 20 to 25%. Mild left ventricular hypertrophy. Grade I diastolic dysfunction (impaired relaxation). Right ventricular systolic function is mildly reduced. RV size is mildly enlarged. LA and RA were mildly dilated. Trivial MR. The aortic valve has been repaired/replaced. Aortic valve regurgitation is trivial. There is a 23 mm valve present in the aortic position. Aortic valve prosthesis not evaluated. Aortic root/ascending aorta has been repaired/replaced.       Neuro/Psych  PSYCHIATRIC DISORDERS Anxiety Depression    negative neurological ROS     GI/Hepatic Neg liver ROS,GERD  Medicated and Controlled,,  Endo/Other  Hypothyroidism    Renal/GU ESRF and DialysisRenal disease     Musculoskeletal negative musculoskeletal ROS (+)    Abdominal   Peds  Hematology negative hematology ROS (+)   Anesthesia Other  Findings   Reproductive/Obstetrics                              Anesthesia Physical Anesthesia Plan  ASA: 4  Anesthesia Plan: General   Post-op Pain Management: Tylenol  PO (pre-op)*   Induction: Intravenous  PONV Risk Score and Plan: 2 and Treatment may vary due to age or medical condition, Ondansetron  and Dexamethasone   Airway Management Planned: LMA  Additional Equipment: ClearSight  Intra-op Plan:   Post-operative Plan: Extubation in OR  Informed Consent: I have reviewed the patients History and Physical, chart, labs and discussed the procedure including the risks, benefits and alternatives for the proposed anesthesia with the patient or authorized representative who has indicated his/her understanding and acceptance.     Dental advisory given  Plan Discussed with: CRNA and Anesthesiologist  Anesthesia Plan Comments:          Anesthesia Quick Evaluation

## 2024-04-02 NOTE — ED Notes (Signed)
 Pt declines NG tube insertion at this time, pt states he may change his mind later depending on nausea and vomiting symptoms.

## 2024-04-02 NOTE — Progress Notes (Signed)
 Patient arrived to room. Alert and oriented x4. Call bell within reach. Family in room with patient.

## 2024-04-02 NOTE — Consult Note (Addendum)
 Consulting Physician: Deward PARAS Atara Paterson  Referring Provider: Dr. Carita  Chief Complaint: Abdominal pain, right inguinal hernia  Reason for Consult: SBO secondary to right inguinal hernia   Subjective   HPI: Bradley [name]. is an 68 y.o. male who is here for abdominal pain related to his right inguinal hernia.  He was discharged from the hospital yesterday after his right inguinal hernia was reduced.  The hernia recurs pretty quickly, and he continues to have nausea vomiting abdominal pain so he came back into the hospital.  The plan was to attempt elective repair of the hernia.  Past Medical History:  Diagnosis Date   Anxiety    Aortic stenosis    s/p Bentall with bioprosthetic AVR 02/2010; Last echo (9/11): Moderate LVH, EF 45-50%, AVR functioning appropriately, aortic valve mean gradient 21, diastolic dysfunction. Chest MRA (2/13): Mild to moderate dilatation at the sinus of Valsalva at 4.1 cm, mild dilatation ascending aorta distal to the tube graft at 3.9 cm, moderate dilatation of the innominate artery a 2.1 cm;     CHF (congestive heart failure) (HCC)    Depression    ESRD on dialysis (HCC)    a. 09/2013 felt to be related to gentamycin b. no longer requiring HD   Hx of cardiac cath    a. LHC in 02/2010: normal cors   Hx of echocardiogram 02/2014   Echo (8/15):  Mod LV, EF 35-40%, Gr 1 DD, AVR ok (mean 14 mmHg), mild LAE   Hypertension    Hypothyroidism, postsurgical    Prosthetic valve endocarditis    Staphylococcus aureus bacteremia    Thyroid  cancer (HCC)    Ventricular tachycardia (HCC)    a. s/p STJ ICD    Past Surgical History:  Procedure Laterality Date   AORTIC VALVE REPAIR  1968   AORTIC VALVE REPLACEMENT  2011   AV FISTULA PLACEMENT Left 10/08/2013   Procedure: ARTERIOVENOUS (AV) FISTULA CREATION- LEFT ARM; Radial Cephalic ;  Surgeon: Lynwood JONETTA Collum, MD;  Location: [facility] OR;  Service: Vascular;  Laterality: Left;   BENTALL PROCEDURE N/A 10/28/2013    Procedure: REDO BENTALL PROCEDURE, debridment of aoritc root abscess, replacement of aortic root, ascending aorta and aortic valve with homograft. Insertion of left femoral arterial line;  Surgeon: Dallas KATHEE Jude, MD;  Location: [facility] OR;  Service: Open Heart Surgery;  Laterality: N/A;   BIOPSY  07/22/2019   Procedure: BIOPSY;  Surgeon: Legrand Victory LITTIE DOUGLAS, MD;  Location: [facility];  Service: Gastroenterology;;   CARDIAC CATHETERIZATION  03/02/2010   NORMAL CORONARY ARTERY   CARDIAC CATHETERIZATION N/A 01/01/2015   Procedure: Coronary/Graft Angiography;  Surgeon: Debby DELENA Sor, MD;  Location: [facility] INVASIVE CV LAB;  Service: Cardiovascular;  Laterality: N/A;   CARDIAC VALVE REPLACEMENT     COLONOSCOPY WITH PROPOFOL  N/A 07/22/2019   Procedure: COLONOSCOPY WITH PROPOFOL ;  Surgeon: Legrand Victory LITTIE DOUGLAS, MD;  Location: [facility];  Service: Gastroenterology;  Laterality: N/A;   EP IMPLANTABLE DEVICE N/A 01/01/2015   Procedure: ICD Implant;  Surgeon: Elspeth JAYSON Sage, MD;  Location: [facility] INVASIVE CV LAB;  Service: Cardiovascular;  Laterality: N/A;   HEMOSTASIS CLIP PLACEMENT  07/22/2019   Procedure: HEMOSTASIS CLIP PLACEMENT;  Surgeon: Legrand Victory LITTIE DOUGLAS, MD;  Location: [facility];  Service: Gastroenterology;;   ICD GENERATOR CHANGEOUT N/A 02/11/2024   Procedure: ICD GENERATOR CHANGEOUT;  Surgeon: Inocencio Soyla Lunger, MD;  Location: [facility] INVASIVE CV LAB;  Service: Cardiovascular;  Laterality: N/A;   INTRAOPERATIVE TRANSESOPHAGEAL  ECHOCARDIOGRAM N/A 10/28/2013   Procedure: INTRAOPERATIVE TRANSESOPHAGEAL ECHOCARDIOGRAM;  Surgeon: Dallas KATHEE Jude, MD;  Location: [facility] OR;  Service: Open Heart Surgery;  Laterality: N/A;   PERIPHERAL VASCULAR CATHETERIZATION N/A 01/01/2015   Procedure: Aortic Arch Angiography;  Surgeon: Debby DELENA Sor, MD;  Location: [facility] INVASIVE CV LAB;  Service: Cardiovascular;  Laterality: N/A;   POLYPECTOMY  07/22/2019   Procedure: POLYPECTOMY;  Surgeon: Legrand Victory LITTIE DOUGLAS, MD;  Location: [facility];  Service: Gastroenterology;;   RIGHT/LEFT HEART CATH AND CORONARY ANGIOGRAPHY N/A 07/09/2023   Procedure: RIGHT/LEFT HEART CATH AND CORONARY ANGIOGRAPHY;  Surgeon: Cherrie Toribio SAUNDERS, MD;  Location: [facility] INVASIVE CV LAB;  Service: Cardiovascular;  Laterality: N/A;   SHOULDER ARTHROSCOPY W/ ROTATOR CUFF REPAIR Right 2012   STERNOTOMY     REDO   TEE WITHOUT CARDIOVERSION N/A 10/24/2013   Procedure: TRANSESOPHAGEAL ECHOCARDIOGRAM (TEE);  Surgeon: Leim VEAR Moose, MD;  Location[facility];  Service: Cardiovascular;  Laterality: N/A;   THYROIDECTOMY  ~ 2005   TRANSTHORACIC ECHOCARDIOGRAM  03/2010   SHOWED MILD REDUCTION OF LV FUNCTION    Family History  Problem Relation Age of Onset   Hypertension Mother    Heart disease Father    Stroke Father    Hypertension Father    Thyroid  cancer Sister    Thyroid  cancer Sister    Kidney Stones Sister    Heart attack Neg Hx    Colon cancer Neg Hx    Colon polyps Neg Hx    Esophageal cancer Neg Hx    Stomach cancer Neg Hx    Rectal cancer Neg Hx     Social:  reports that he has never smoked. He has never used smokeless tobacco. He reports that he does not drink alcohol and does not use drugs.  Allergies:  Allergies  Allergen Reactions   Oxycodone  Other (See Comments)    Gives patient nightmares   Iodinated Contrast Media Rash   Rifampin  Nausea Only    Medications: Current Outpatient Medications  Medication Instructions   amoxicillin (AMOXIL) 500 MG capsule SMARTSIG:4 Capsule(s) By Mouth   ARIPiprazole  (ABILIFY ) 5 MG tablet Take 1 tablet nightly   aspirin  EC 81 mg, Oral, Daily at bedtime   atorvastatin  (LIPITOR) 40 mg, Oral, Daily   carvedilol  (COREG ) 6.25 mg, Oral, 2 times daily with meals   clonazePAM  (KLONOPIN ) 0.5 mg, Oral, Daily PRN   docusate sodium  (COLACE) 100 mg, Oral, 3 times weekly   doxazosin  (CARDURA ) 1 mg, Oral, Daily, For SBP greater than 140   ENTRESTO  97-103 MG 1 tablet, Oral, 2 times daily   Farxiga  10  mg, Oral, Daily   furosemide  (LASIX ) 20 MG tablet TAKE 1 TABLET BY MOUTH 4 TIMES A WEEK.   levothyroxine  (SYNTHROID ) 150 mcg, Oral, Daily   mexiletine (MEXITIL ) 150 MG capsule TAKE 2 CAPSULES BY MOUTH EVERY MORNING AND 2 CAPSULES BY MOUTH EVERY EVENING.   Multiple Vitamins-Minerals (AIRBORNE) CHEW 1 tablet, Oral, Daily at bedtime   omeprazole  (PRILOSEC) 20 mg, Oral, Daily PRN   ranolazine  (RANEXA ) 500 mg, Oral, 2 times daily   sertraline  (ZOLOFT ) 50 mg, Oral, Daily    ROS - all of the below systems have been reviewed with the patient and positives are indicated with bold text General: chills, fever or night sweats Eyes: blurry vision or double vision ENT: epistaxis or sore throat Allergy/Immunology: itchy/watery eyes or nasal congestion Hematologic/Lymphatic: bleeding problems, blood clots or swollen lymph nodes Endocrine: temperature intolerance or unexpected weight changes Breast: new  or changing breast lumps or nipple discharge Resp: cough, shortness of breath, or wheezing CV: chest pain or dyspnea on exertion GI: as per HPI GU: dysuria, trouble voiding, or hematuria MSK: joint pain or joint stiffness Neuro: TIA or stroke symptoms Derm: pruritus and skin lesion changes Psych: anxiety and depression  Objective   PE Blood pressure 126/76, pulse 98, temperature 98.1 F (36.7 C), temperature source Oral, resp. rate 20, height 6' 1 (1.854 m), weight 90.3 kg, SpO2 98%. Constitutional: NAD; conversant; no deformities Eyes: Moist conjunctiva; no lid lag; anicteric; PERRL Neck: Trachea midline; no thyromegaly Lungs: Normal respiratory effort; no tactile fremitus CV: RRR; no palpable thrills; no pitting edema GI: Abd large right and small left inguinal hernia, able to be fully reduced with moderate effort on exam, patient tolerated reduction well. MSK: Normal range of motion of extremities; no clubbing/cyanosis Psychiatric: Appropriate affect; alert and oriented x3 Lymphatic: No  palpable cervical or axillary lymphadenopathy  Results for orders placed or performed during the hospital encounter of 04/02/24 (from the past 24 hours)  Lipase, blood     Status: None   Collection Time: 04/02/24 12:43 AM  Result Value Ref Range   Lipase 24 11 - 51 U/L  Comprehensive metabolic panel     Status: Abnormal   Collection Time: 04/02/24 12:43 AM  Result Value Ref Range   Sodium 140 135 - 145 mmol/L   Potassium 3.8 3.5 - 5.1 mmol/L   Chloride 107 98 - 111 mmol/L   CO2 21 (L) 22 - 32 mmol/L   Glucose, Bld 120 (H) 70 - 99 mg/dL   BUN 23 8 - 23 mg/dL   Creatinine, Ser 8.80 0.61 - 1.24 mg/dL   Calcium  9.0 8.9 - 10.3 mg/dL   Total Protein 6.2 (L) 6.5 - 8.1 g/dL   Albumin  3.5 3.5 - 5.0 g/dL   AST 22 15 - 41 U/L   ALT 19 0 - 44 U/L   Alkaline Phosphatase 35 (L) 38 - 126 U/L   Total Bilirubin 0.9 0.0 - 1.2 mg/dL   GFR, Estimated >39 >39 mL/min   Anion gap 12 5 - 15  CBC     Status: Abnormal   Collection Time: 04/02/24 12:43 AM  Result Value Ref Range   WBC 5.5 4.0 - 10.5 K/uL   RBC 4.83 4.22 - 5.81 MIL/uL   Hemoglobin 15.2 13.0 - 17.0 g/dL   HCT 53.7 60.9 - 47.9 %   MCV 95.7 80.0 - 100.0 fL   MCH 31.5 26.0 - 34.0 pg   MCHC 32.9 30.0 - 36.0 g/dL   RDW 85.9 88.4 - 84.4 %   Platelets 119 (L) 150 - 400 K/uL   nRBC 0.0 0.0 - 0.2 %  Troponin I (High Sensitivity)     Status: Abnormal   Collection Time: 04/02/24 12:43 AM  Result Value Ref Range   Troponin I (High Sensitivity) 39 (H) <18 ng/L  I-Stat CG4 Lactic Acid     Status: None   Collection Time: 04/02/24  2:22 AM  Result Value Ref Range   Lactic Acid, Venous 1.0 0.5 - 1.9 mmol/L  Troponin I (High Sensitivity)     Status: Abnormal   Collection Time: 04/02/24  3:20 AM  Result Value Ref Range   Troponin I (High Sensitivity) 44 (H) <18 ng/L   *Note: Due to a large number of results and/or encounters for the requested time period, some results have not been displayed. A complete set of results can be  found in Results  Review.     Imaging Orders         CT ABDOMEN PELVIS WO CONTRAST     1. Persistent small bowel obstruction with point of transition involving the small bowel as it passes into the large right inguinal hernia sac. 2. Mild descending colonic diverticulosis. 3. Large right and small left inguinal hernias. 4. Mild prostatic hypertrophy. 5. Aortic atherosclerosis.  Assessment and Plan   Bradley [name]. is an 68 y.o. male with a recurrent right inguinal hernia with secondary small bowel obstruction.  This hernia is again able to be reduced.  He has multiple medical issues.  He is on a baby aspirin .  At this point, I recommend proceeding with inguinal hernia repair while he is here in the hospital.  He would like to take some time to discuss this plan with his family.  I will discuss the case with Dr. Ann in the morning and we will check back on him to make plans.  I do not feel he needs a nasogastric tube at this point as the hernia has been able to be reduced.  If you were to develop nausea and vomiting again, please place a nasogastric tube.  Deward JINNY Foy, MD  [facility] Use [email or web address] to contact on call provider  New Patient Billing: [phone] - High MDM

## 2024-04-02 NOTE — ED Triage Notes (Signed)
 Pt BIB EMS from home. Reports pt was recently discharged for NVD which has continued since. Pt c/o 6/10 RLQ abd pain with palpation.   EMS admin 4mg  zofran  and ns EMS VS cbg 133, 99.1, 118/72, HR 90 with pacemaker, RR 18, 97% RA

## 2024-04-02 NOTE — H&P (Addendum)
 History and Physical    Patient: Bradley [name]. FMW:[id number] DOB: 1956/04/16 DOA: 04/02/2024 DOS: the patient was seen and examined on 04/02/2024 PCP: Micheal Wolm ORN, MD  Patient coming from: Home  Chief Complaint:  Chief Complaint  Patient presents with   Vomiting   HPI: Bradley [name]. is a 68 y.o. male with medical history significant of HFrEF / NICM with VT s/p ICD placement 02/11/2024, congenital AS s/p balloon valvotomy at age 36 / AV repair 1968, CAD HTN, thyroid  cancer s/p thyroidectomy with residual hypothyroidism, and MDD w/ psychotic presents with vomiting, diarrhea, and abdominal discomfort.  Patient had just recently been hospitalized from 9/1-9/2 after presenting with abdominal pain with nausea and vomiting found to have a distal small bowel obstruction related to an incarcerated inguinal hernia.  Patient had NG tube placed and was seen by general surgery who reduced inguinal hernia.  However, the following day inguinal hernia reoccurred but was easily reduced again by general surgery.  NG tube was able to be removed and patient had a bowel movement prior to being discharged home to follow-up in the outpatient setting with surgery.  He began experiencing vomiting and diarrhea almost immediately after returning home from a previous visit. The symptoms have persisted, causing significant discomfort.  He describes the abdominal discomfort as mild and localized to the lower right side of the abdomen. Additionally, he has been running a fever, with a recorded temperature of 100.1F.  No issues with breathing are reported.  In the ED patient was noted to be mildly tachypneic with all other vital signs within normal limits.  Labs noted platelets 119, high-sensitivity troponin 39->44, and lactic acid 1.  CT scan of the abdomen pelvis noted persistent small bowel obstruction with point of transition involving the small bowel as it passes through the large right inguinal hernia.   General surgery had been consulted.  Patient has been given 1 L lactated Ringer 's and Ativan  1 mg IV.  Review of Systems: As mentioned in the history of present illness. All other systems reviewed and are negative. Past Medical History:  Diagnosis Date   Anxiety    Aortic stenosis    s/p Bentall with bioprosthetic AVR 02/2010; Last echo (9/11): Moderate LVH, EF 45-50%, AVR functioning appropriately, aortic valve mean gradient 21, diastolic dysfunction. Chest MRA (2/13): Mild to moderate dilatation at the sinus of Valsalva at 4.1 cm, mild dilatation ascending aorta distal to the tube graft at 3.9 cm, moderate dilatation of the innominate artery a 2.1 cm;     CHF (congestive heart failure) (HCC)    Depression    ESRD on dialysis (HCC)    a. 09/2013 felt to be related to gentamycin b. no longer requiring HD   Hx of cardiac cath    a. LHC in 02/2010: normal cors   Hx of echocardiogram 02/2014   Echo (8/15):  Mod LV, EF 35-40%, Gr 1 DD, AVR ok (mean 14 mmHg), mild LAE   Hypertension    Hypothyroidism, postsurgical    Prosthetic valve endocarditis    Staphylococcus aureus bacteremia    Thyroid  cancer (HCC)    Ventricular tachycardia (HCC)    a. s/p STJ ICD   Past Surgical History:  Procedure Laterality Date   AORTIC VALVE REPAIR  1968   AORTIC VALVE REPLACEMENT  2011   AV FISTULA PLACEMENT Left 10/08/2013   Procedure: ARTERIOVENOUS (AV) FISTULA CREATION- LEFT ARM; Radial Cephalic ;  Surgeon: Lynwood JONETTA Collum, MD;  Location: [facility]  OR;  Service: Vascular;  Laterality: Left;   BENTALL PROCEDURE N/A 10/28/2013   Procedure: REDO BENTALL PROCEDURE, debridment of aoritc root abscess, replacement of aortic root, ascending aorta and aortic valve with homograft. Insertion of left femoral arterial line;  Surgeon: Dallas KATHEE Jude, MD;  Location: [facility] OR;  Service: Open Heart Surgery;  Laterality: N/A;   BIOPSY  07/22/2019   Procedure: BIOPSY;  Surgeon: Legrand Victory LITTIE DOUGLAS, MD;  Location: [facility];  Service:  Gastroenterology;;   CARDIAC CATHETERIZATION  03/02/2010   NORMAL CORONARY ARTERY   CARDIAC CATHETERIZATION N/A 01/01/2015   Procedure: Coronary/Graft Angiography;  Surgeon: Debby DELENA Sor, MD;  Location: [facility] INVASIVE CV LAB;  Service: Cardiovascular;  Laterality: N/A;   CARDIAC VALVE REPLACEMENT     COLONOSCOPY WITH PROPOFOL  N/A 07/22/2019   Procedure: COLONOSCOPY WITH PROPOFOL ;  Surgeon: Legrand Victory LITTIE DOUGLAS, MD;  Location: [facility];  Service: Gastroenterology;  Laterality: N/A;   EP IMPLANTABLE DEVICE N/A 01/01/2015   Procedure: ICD Implant;  Surgeon: Elspeth JAYSON Sage, MD;  Location: [facility] INVASIVE CV LAB;  Service: Cardiovascular;  Laterality: N/A;   HEMOSTASIS CLIP PLACEMENT  07/22/2019   Procedure: HEMOSTASIS CLIP PLACEMENT;  Surgeon: Legrand Victory LITTIE DOUGLAS, MD;  Location: [facility];  Service: Gastroenterology;;   ICD GENERATOR CHANGEOUT N/A 02/11/2024   Procedure: ICD GENERATOR CHANGEOUT;  Surgeon: Inocencio Soyla Lunger, MD;  Location: [facility] INVASIVE CV LAB;  Service: Cardiovascular;  Laterality: N/A;   INTRAOPERATIVE TRANSESOPHAGEAL ECHOCARDIOGRAM N/A 10/28/2013   Procedure: INTRAOPERATIVE TRANSESOPHAGEAL ECHOCARDIOGRAM;  Surgeon: Dallas KATHEE Jude, MD;  Location: [facility] OR;  Service: Open Heart Surgery;  Laterality: N/A;   PERIPHERAL VASCULAR CATHETERIZATION N/A 01/01/2015   Procedure: Aortic Arch Angiography;  Surgeon: Debby DELENA Sor, MD;  Location: [facility] INVASIVE CV LAB;  Service: Cardiovascular;  Laterality: N/A;   POLYPECTOMY  07/22/2019   Procedure: POLYPECTOMY;  Surgeon: Legrand Victory LITTIE DOUGLAS, MD;  Location: [facility];  Service: Gastroenterology;;   RIGHT/LEFT HEART CATH AND CORONARY ANGIOGRAPHY N/A 07/09/2023   Procedure: RIGHT/LEFT HEART CATH AND CORONARY ANGIOGRAPHY;  Surgeon: Cherrie Toribio SAUNDERS, MD;  Location: [facility] INVASIVE CV LAB;  Service: Cardiovascular;  Laterality: N/A;   SHOULDER ARTHROSCOPY W/ ROTATOR CUFF REPAIR Right 2012   STERNOTOMY     REDO   TEE WITHOUT CARDIOVERSION N/A 10/24/2013    Procedure: TRANSESOPHAGEAL ECHOCARDIOGRAM (TEE);  Surgeon: Leim VEAR Moose, MD;  Location: [facility] ENDOSCOPY;  Service: Cardiovascular;  Laterality: N/A;   THYROIDECTOMY  ~ 2005   TRANSTHORACIC ECHOCARDIOGRAM  03/2010   SHOWED MILD REDUCTION OF LV FUNCTION   Social History:  reports that he has never smoked. He has never used smokeless tobacco. He reports that he does not drink alcohol and does not use drugs.  Allergies  Allergen Reactions   Oxycodone  Other (See Comments)    Gives patient nightmares   Iodinated Contrast Media Rash   Rifampin  Nausea Only    Family History  Problem Relation Age of Onset   Hypertension Mother    Heart disease Father    Stroke Father    Hypertension Father    Thyroid  cancer Sister    Thyroid  cancer Sister    Kidney Stones Sister    Heart attack Neg Hx    Colon cancer Neg Hx    Colon polyps Neg Hx    Esophageal cancer Neg Hx    Stomach cancer Neg Hx    Rectal cancer Neg Hx     Prior to Admission medications   Medication Sig Start  Date End Date Taking? Authorizing Provider  amoxicillin (AMOXIL) 500 MG capsule SMARTSIG:4 Capsule(s) By Mouth 06/15/23   [provider]  ARIPiprazole  (ABILIFY ) 5 MG tablet Take 1 tablet nightly Patient taking differently: Take 5 mg by mouth at bedtime. 03/07/24   Burchette, Wolm ORN, MD  aspirin  EC 81 MG tablet Take 81 mg by mouth at bedtime.     [provider]  atorvastatin  (LIPITOR) 40 MG tablet TAKE 1 TABLET BY MOUTH EVERY DAY Patient taking differently: Take 40 mg by mouth at bedtime. 03/13/24   Burchette, Wolm ORN, MD  carvedilol  (COREG ) 6.25 MG tablet TAKE 1 TABLET BY MOUTH TWICE A DAY WITH FOOD 11/09/23   Bensimhon, Toribio SAUNDERS, MD  clonazePAM  (KLONOPIN ) 1 MG tablet Take 0.5 mg by mouth daily as needed for anxiety.    [provider]  docusate sodium  (COLACE) 100 MG capsule Take 100 mg by mouth 3 (three) times a week.    [provider]  doxazosin  (CARDURA ) 1 MG tablet TAKE 1 TABLET (1  MG TOTAL) BY MOUTH DAILY. FOR SBP GREATER THAN 140 Patient taking differently: Take 1 mg by mouth daily as needed (only taking when SBP is greater than 140). 01/04/24   Bensimhon, Toribio SAUNDERS, MD  ENTRESTO  97-103 MG TAKE 1 TABLET BY MOUTH TWICE A DAY 01/14/24   Bensimhon, Toribio SAUNDERS, MD  FARXIGA  10 MG TABS tablet TAKE 1 TABLET BY MOUTH EVERY DAY 03/19/23   Bensimhon, Toribio SAUNDERS, MD  furosemide  (LASIX ) 20 MG tablet TAKE 1 TABLET BY MOUTH 4 TIMES A WEEK. Patient taking differently: Take 20 mg by mouth 3 (three) times a week. Monday, Thursday, and Saturday 01/14/24   Bensimhon, Toribio SAUNDERS, MD  levothyroxine  (SYNTHROID ) 150 MCG tablet TAKE 1 TABLET BY MOUTH EVERY DAY 03/19/24   Burchette, Wolm ORN, MD  mexiletine (MEXITIL ) 150 MG capsule TAKE 2 CAPSULES BY MOUTH EVERY MORNING AND 2 CAPSULES BY MOUTH EVERY EVENING. 03/24/24   Bensimhon, Toribio SAUNDERS, MD  Multiple Vitamins-Minerals (AIRBORNE) CHEW Chew 1 tablet by mouth at bedtime.    [provider]  omeprazole  (PRILOSEC) 20 MG capsule Take 20 mg by mouth daily as needed (Stomach).    [provider]  ranolazine  (RANEXA ) 500 MG 12 hr tablet TAKE 1 TABLET BY MOUTH TWICE A DAY 02/04/24   Bensimhon, Toribio SAUNDERS, MD  sertraline  (ZOLOFT ) 50 MG tablet TAKE 1 TABLET BY MOUTH EVERY DAY 02/04/24   Micheal Wolm ORN, MD    Physical Exam: Vitals:   04/02/24 0036 04/02/24 0530 04/02/24 0541 04/02/24 0645  BP:  110/74  100/76  Pulse:  98  91  Resp:  (!) 25  13  Temp:   98.5 F (36.9 C)   TempSrc:      SpO2:  96%  96%  Weight: 90.3 kg     Height: 6' 1 (1.854 m)       Constitutional:  Elderly male currently in no acute distress Eyes: PERRL, lids and conjunctivae normal ENMT: Mucous membranes are moist. Normal dentition.  Neck: normal, supple Respiratory: clear to auscultation bilaterally, no wheezing, no crackles.  Cardiovascular: Regular rate and rhythm, no murmurs / rubs / gallops. 2+ pedal pulses. Abdomen: no tenderness, no masses palpated. Bowel sounds  positive.  Musculoskeletal: no clubbing / cyanosis.  Good ROM, no contractures. Normal muscle tone.  Skin: no rashes, lesions, ulcers. No induration Neurologic: CN 2-12 grossly intact. Sensation intact, DTR normal. Strength 5/5 in all 4.  Psychiatric: Normal judgment and insight. Alert and  oriented x 3. Normal mood.   Data Reviewed:  EKG revealed a sinus or ectopic atrial tachycardia 205 bpm with QTc 527.  Reviewed labs, imaging, and pertinent records as documented. Assessment and Plan:  Small bowel obstruction secondary to inguinal hernia Recurrent.  Patient presents with complaints of abdominal pain with nausea and vomiting.  CT reveals reoccurrence of the small bowel obstruction with transition point involving large right inguinal hernia.  General surgery consulted and hernia was able to be reduced.  Patient had been given Ativan  1 mg IV and 1 L of IV fluids.  However due to reoccurrence surgery recommended. - Admit to a telemetry bed - Aspiration precautions with elevation head of the bed - N.p.o. after midnight for surgical procedure in a.m. - Normal saline IV fluids at 40 mL/h - Morphine  IV as needed for pain - Tigan  as needed for nausea vomiting - Appreciate General Surgery consultative services, will follow-up for any further recommendation  Heart failure with reduced EF Patient appears to be euvolemic on physical exam.  Last available echocardiogram from 06/2023 noted EF to be 20 to 25% with grade 1 diastolic dysfunction with - Strict I&Os and daily weights - Cardiology consulted to evaluate for perioperatively per surgery recommendations, will follow-up for any further recommendations  Elevated troponin CAD Acute.  High-sensitivity troponins 39->44.  Patient denies any complaints of chest pain at this time.    Last heart catheterization in 07/2023 noted 95% ostial RCA lesion for which medical treatment was recommended.  - Continue atorvastatin    History of congenital AV  stenosis s/p valvotomy, Bentall 2001 with tissue valve, and redo in 2015 Stable per last echocardiogram in 06/2023.  History of VT S/p ICD - Continue mexiletine and Ranexa     Thrombocytopenia Platelet count noted to be 119 which appears to be intermittently been low over the last several years. - Continue to monitor  History of thyroid  cancer Postsurgical hypothyroidism - Continue levothyroxine    Depression and anxiety - Continue Zoloft , Abilify , and clonazepam   GERD - Continue pharmacy substitution Protonix   DVT prophylaxis: Lovenox  Advance Care Planning:   Code Status: Full Code   Consults: Cardiology  Family Communication: Wife updated at bedside  Severity of Illness: The appropriate patient status for this patient is INPATIENT. Inpatient status is judged to be reasonable and necessary in order to provide the required intensity of service to ensure the patient's safety. The patient's presenting symptoms, physical exam findings, and initial radiographic and laboratory data in the context of their chronic comorbidities is felt to place them at high risk for further clinical deterioration. Furthermore, it is not anticipated that the patient will be medically stable for discharge from the hospital within 2 midnights of admission.   * I certify that at the point of admission it is my clinical judgment that the patient will require inpatient hospital care spanning beyond 2 midnights from the point of admission due to high intensity of service, high risk for further deterioration and high frequency of surveillance required.*  Author: Maximino DELENA Sharps, MD 04/02/2024 7:50 AM  For on call review [email or web address].

## 2024-04-02 NOTE — Plan of Care (Signed)

## 2024-04-02 NOTE — ED Notes (Signed)
 Patient transported to CT

## 2024-04-02 NOTE — Progress Notes (Signed)
 Progress Note     Interval: Patient discharged yesterday afternoon with recurrent obstructive symptoms late last night prompting representation to ED.  Objective: Vital signs in last 24 hours: Temp:  [98 F (36.7 C)-98.5 F (36.9 C)] 98 F (36.7 C) (09/03 0941) Pulse Rate:  [77-98] 89 (09/03 0900) Resp:  [12-25] 12 (09/03 0900) BP: (100-126)/(66-80) 115/80 (09/03 0900) SpO2:  [96 %-98 %] 98 % (09/03 0906) Weight:  [90.3 kg] 90.3 kg (09/03 0036)    Intake/Output from previous day: No intake/output data recorded. Intake/Output this shift: No intake/output data recorded.  PE: General: NAD HEENT: NGT clamped  Heart: regular, rate, and rhythm Lungs: Normal work of breathing on room air Abd: soft, NT, ND. RIH soft, able to reduce, nontender MS: all 4 extremities are symmetrical with no cyanosis, clubbing, or edema. Skin: warm and dry with no masses, lesions, or rashes Neuro: No focal neurologic deficits Psych: A&Ox3 with an appropriate affect.    Lab Results:  Recent Labs    03/31/24 1051 04/02/24 0043  WBC 18.7* 5.5  HGB 18.7* 15.2  HCT 57.1* 46.2  PLT 162 119*   BMET Recent Labs    03/31/24 1640 04/02/24 0043  NA 144 140  K 4.1 3.8  CL 109 107  CO2 19* 21*  GLUCOSE 140* 120*  BUN 20 23  CREATININE 1.24 1.19  CALCIUM  9.5 9.0   PT/INR No results for input(s): LABPROT, INR in the last 72 hours. CMP     Component Value Date/Time   NA 140 04/02/2024 0043   NA 144 01/29/2024 1237   K 3.8 04/02/2024 0043   CL 107 04/02/2024 0043   CO2 21 (L) 04/02/2024 0043   GLUCOSE 120 (H) 04/02/2024 0043   BUN 23 04/02/2024 0043   BUN 22 01/29/2024 1237   CREATININE 1.19 04/02/2024 0043   CREATININE 1.28 10/14/2015 1228   CALCIUM  9.0 04/02/2024 0043   PROT 6.2 (L) 04/02/2024 0043   ALBUMIN  3.5 04/02/2024 0043   AST 22 04/02/2024 0043   ALT 19 04/02/2024 0043   ALKPHOS 35 (L) 04/02/2024 0043   BILITOT 0.9 04/02/2024 0043   GFRNONAA >60 04/02/2024 0043    GFRAA 74 04/13/2020 1609   Lipase     Component Value Date/Time   LIPASE 24 04/02/2024 0043       Studies/Results: CT ABDOMEN PELVIS WO CONTRAST Result Date: 04/02/2024 CLINICAL DATA:  Acute nonlocalized abdominal pain EXAM: CT ABDOMEN AND PELVIS WITHOUT CONTRAST TECHNIQUE: Multidetector CT imaging of the abdomen and pelvis was performed following the standard protocol without IV contrast. RADIATION DOSE REDUCTION: This exam was performed according to the departmental dose-optimization program which includes automated exposure control, adjustment of the mA and/or kV according to patient size and/or use of iterative reconstruction technique. COMPARISON:  03/31/2024 FINDINGS: Lower chest: No acute abnormality. Hepatobiliary: No focal liver abnormality is seen. No gallstones, gallbladder wall thickening, or biliary dilatation. Pancreas: Unremarkable Spleen: Unremarkable Adrenals/Urinary Tract: Adrenal glands are unremarkable. Simple cortical cysts are seen within the kidneys bilaterally for which no follow-up imaging is recommended. The kidneys are otherwise unremarkable. Bladder unremarkable. Stomach/Bowel: Dilated gas and fluid-filled loops of small bowel are again identified involving the proximal in mid small bowel with loops of bowel measuring up to 4.5 cm in diameter. Similar prior examination, there is a notable caliber change involving the small bowel as it passes into the large right hernia sac in keeping with the point of transition for the small-bowel obstruction. Similar prior examination, the  hernia sac contains multiple loops of unremarkable small bowel as well as a small amount of free fluid. Mild descending colonic diverticulosis. The stomach, large, and small bowel are otherwise unremarkable. The appendix is not visualized and may be absent. No free intraperitoneal gas. Vascular/Lymphatic: Aortic atherosclerosis. No enlarged abdominal or pelvic lymph nodes. Reproductive: Mild prostatic  hypertrophy Other: Large right and small left inguinal hernias are present. Musculoskeletal: No acute bone abnormality. No lytic or blastic bone lesion. Osseous structures are age appropriate. IMPRESSION: 1. Persistent small bowel obstruction with point of transition involving the small bowel as it passes into the large right inguinal hernia sac. 2. Mild descending colonic diverticulosis. 3. Large right and small left inguinal hernias. 4. Mild prostatic hypertrophy. 5. Aortic atherosclerosis. Aortic Atherosclerosis (ICD10-I70.0). Electronically Signed   By: Dorethia Molt M.D.   On: 04/02/2024 03:56   DG Abd Portable 1 View Result Date: 03/31/2024 EXAM: 1 VIEW XRAY OF THE ABDOMEN 03/31/2024 02:29:00 PM COMPARISON: None available. CLINICAL HISTORY: Post-NG. Reason for exam: Post-NG; Per triage notes: Pt BIB EMS from home for N/V starting this morning and complaints of being cold. Initial oral temp with EMS 94.2 but repeat was 97. Pt has a defibrillator, battery replaced 2 months ago. Hx of previous heart valve infection, redone in 2015. AOX4. FINDINGS: BOWEL: Partially visualized distended small bowel loops in the upper abdomen. SOFT TISSUES: No opaque urinary calculi. Left chest wall single lead cardiac defibrillator noted. BONES: No acute osseous abnormality. LINES AND TUBES: Enteric tube in place with distal tip and side port in the proximal stomach. IMPRESSION: 1. Enteric tube in place with distal tip and side port in the proximal stomach. 2. Partially visualized distended small bowel loops in the upper abdomen, compatible with distal small bowel obstruction seen on CT from earlier today. Electronically signed by: Selinda Blue MD 03/31/2024 03:00 PM EDT RP Workstation: [id number]   CT ABDOMEN PELVIS WO CONTRAST Result Date: 03/31/2024 EXAM: CT ABDOMEN AND PELVIS WITHOUT CONTRAST 03/31/2024 12:59:00 PM TECHNIQUE: CT of the abdomen and pelvis was performed without the administration of intravenous contrast.  Multiplanar reformatted images are provided for review. Automated exposure control, iterative reconstruction, and/or weight-based adjustment of the mA/kV was utilized to reduce the radiation dose to as low as reasonably achievable. COMPARISON: 03/16/23 CT abdomen/pelvis CLINICAL HISTORY: Abd pain, vomiting. No contrast; Pt BIB EMS from home for N/V starting this morning and complaints of being cold. Initial oral temp with EMS 94.2 but repeat was 97. Pt has a defibrillator, battery replaced 2 months ago. Hx of previous heart valve infection, redone in 2015. AOX4. FINDINGS: LOWER CHEST: Intact lower sternotomy wires. ICD lead indeterminant in the right ventricular apex. Very middle lobe 4 mm pulmonary nodule on series 5/image 6 is unchanged from 10/23/13 chest CT and considered benign. No acute abnormality of the lung bases. Fluid in the lower thoracic esophagus. LIVER: Chronic small coarse dystrophic calcification in the posterior right liver. Otherwise normal liver with no liver masses. GALLBLADDER AND BILE DUCTS: No wall thickening. No cholelithiasis. No biliary ductal dilatation. SPLEEN: Normal size. No focal lesion. PANCREAS: No mass. No ductal dilatation. ADRENAL GLANDS: Stable 1.3 cm left adrenal nodule with density negative for HU compatible with an adenoma. KIDNEYS, URETERS AND BLADDER: Scattered small simple bilateral renal cysts, largest 2.5 cm in the lateral upper right kidney, for which no follow up imaging is recommended. No stones in the kidneys or ureters. No hydronephrosis. No perinephric or periureteral stranding. Normal nondistended bladder. GI AND BOWEL:  Large right inguinal hernia containing multiple small bowel loops. Diffuse dilatation of the small bowel up to 4 cm diameter proximal to the right inguinal hernia, with diffuse small bowel air-fluid levels compatible with small bowel obstruction. Small amount of free fluid in the right inguinal hernia sac. Appendix not discretely visualized. Left  colonic diverticulosis with no large bowel wall thickening or significant pericolonic fat stranding. No definite pneumatosis or bowel wall thickening. PERITONEUM AND RETROPERITONEUM: Trace right-sided ascites. No focal fluid collection. VASCULATURE: Atherosclerotic nonaneurysmal abdominal aorta. LYMPH NODES: No lymphadenopathy. REPRODUCTIVE ORGANS: Chronic mild prostatomegaly. BONES AND SOFT TISSUES: Moderate thoracolumbar spondylosis. No acute osseous abnormality. No focal soft tissue abnormality. IMPRESSION: 1. High-grade distal small bowel obstruction at the level of the large right inguinal hernia containing multiple distal small bowel loops. 2. Trace right-sided ascites and small volume fluid within the right inguinal hernia sac. No free air or definite pneumatosis. 3. Mild left colonic diverticulosis. 4. Stable small left adrenal adenoma, for which no follow-up imaging is recommended. Electronically signed by: Selinda Blue MD 03/31/2024 01:38 PM EDT RP Workstation: [id number]     Assessment/Plan 68 yo M with AS, CHF, AVR on antiplatelet therapy who presented with incarcerated RIH with obstructive symptoms, now reduced. Also has asymptomatic LIH that seems to be fat containing.   LOS: 0 days   - Given recurrence almost immediately, needs repair this admission - Will reach out to cardiology to make sure no other measures needed to optimize pre-op - Possible OR this afternoon versus tomorrow AM pending OR schedule and cardiology recommendations - NPO, IVF per primary - Discussed with patient only repairing right hernia today with open procedure as I do not perform laparoscopic hernia repairs. If left becomes issues in future then can have it repaired electively. - I discussed risks of surgery with patient including but not limited to: bleeding, infection, hematoma, seroma, injury to surrounding structures, need for bowel resection, nerve injury, chronic pain, injury to spermatic vessels that may  compromise blood supply to testicle, injury to vas deferens decreasing fertility, need for take back to OR, possibility of needing laparotomy to help reduce hernia if unable to reduce through inguinal incision. Patient voiced understanding and wishes to proceed with OR.  I reviewed hospitalist notes, last 24 h vitals and pain scores, last 48 h intake and output, last 24 h labs and trends, and last 24 h imaging results.  This care required high  level of medical decision making.    Richerd Silversmith, MD [facility] Surgery 04/02/2024, 9:44 AM Please see Amion for pager number during day hours 7:00am-4:30pm

## 2024-04-02 NOTE — ED Provider Notes (Signed)
 [place] EMERGENCY DEPARTMENT AT [facility] Provider Note   CSN: [id number] Arrival date & time: 04/02/24  9970     Patient presents with: Vomiting   Bradley [name]. is a 68 y.o. male.   Bradley [name] is a 68 y.o. male with h/o HFrEF / NICM with VT s/p ICD placement 02/11/2024, congenital AS s/p balloon valvotomy at age 4 / AV repair 1968, HTN, discharge from the hospital approximately 7 hours ago.  He was admitted for small bowel obstruction in the setting of incarcerated right inguinal hernia.  He was feeling improved and tolerating p.o. prior to discharge.  Since going home he has vomited about 8 times as well as had about 6 or 7 episodes of nonbloody stools.  He believes he is having diarrhea, right-sided lower abdominal pain and fever up to 101.  Abdominal pain is not new but vomiting and fever is.  Does have a known right inguinal hernia which is soft and reducible.  Has not had any surgery on it.  Still has appendix and gallbladder.  No travel or recent sick contacts or antibiotic use.  No chest pain or shortness of breath. He is not vomiting prior to leaving the hospital.  The history is provided by the patient.       Prior to Admission medications   Medication Sig Start Date End Date Taking? Authorizing Provider  amoxicillin (AMOXIL) 500 MG capsule SMARTSIG:4 Capsule(s) By Mouth 06/15/23   [provider]  ARIPiprazole  (ABILIFY ) 5 MG tablet Take 1 tablet nightly Patient taking differently: Take 5 mg by mouth at bedtime. 03/07/24   Burchette, Wolm ORN, MD  aspirin  EC 81 MG tablet Take 81 mg by mouth at bedtime.     [provider]  atorvastatin  (LIPITOR) 40 MG tablet TAKE 1 TABLET BY MOUTH EVERY DAY Patient taking differently: Take 40 mg by mouth at bedtime. 03/13/24   Burchette, Wolm ORN, MD  carvedilol  (COREG ) 6.25 MG tablet TAKE 1 TABLET BY MOUTH TWICE A DAY WITH FOOD 11/09/23   Bensimhon, Toribio SAUNDERS, MD  clonazePAM  (KLONOPIN ) 1 MG tablet Take 0.5  mg by mouth daily as needed for anxiety.    [provider]  docusate sodium  (COLACE) 100 MG capsule Take 100 mg by mouth 3 (three) times a week.    [provider]  doxazosin  (CARDURA ) 1 MG tablet TAKE 1 TABLET (1 MG TOTAL) BY MOUTH DAILY. FOR SBP GREATER THAN 140 Patient taking differently: Take 1 mg by mouth daily as needed (only taking when SBP is greater than 140). 01/04/24   Bensimhon, Toribio SAUNDERS, MD  ENTRESTO  97-103 MG TAKE 1 TABLET BY MOUTH TWICE A DAY 01/14/24   Bensimhon, Toribio SAUNDERS, MD  FARXIGA  10 MG TABS tablet TAKE 1 TABLET BY MOUTH EVERY DAY 03/19/23   Bensimhon, Toribio SAUNDERS, MD  furosemide  (LASIX ) 20 MG tablet TAKE 1 TABLET BY MOUTH 4 TIMES A WEEK. Patient taking differently: Take 20 mg by mouth 3 (three) times a week. Monday, Thursday, and Saturday 01/14/24   Bensimhon, Toribio SAUNDERS, MD  levothyroxine  (SYNTHROID ) 150 MCG tablet TAKE 1 TABLET BY MOUTH EVERY DAY 03/19/24   Burchette, Wolm ORN, MD  mexiletine (MEXITIL ) 150 MG capsule TAKE 2 CAPSULES BY MOUTH EVERY MORNING AND 2 CAPSULES BY MOUTH EVERY EVENING. 03/24/24   Bensimhon, Toribio SAUNDERS, MD  Multiple Vitamins-Minerals (AIRBORNE) CHEW Chew 1 tablet by mouth at bedtime.    [provider]  omeprazole  (PRILOSEC) 20 MG capsule Take 20  mg by mouth daily as needed (Stomach).    [provider]  ranolazine  (RANEXA ) 500 MG 12 hr tablet TAKE 1 TABLET BY MOUTH TWICE A DAY 02/04/24   Bensimhon, Toribio SAUNDERS, MD  sertraline  (ZOLOFT ) 50 MG tablet TAKE 1 TABLET BY MOUTH EVERY DAY 02/04/24   Burchette, Wolm ORN, MD    Allergies: Oxycodone , Iodinated contrast media, and Rifampin     Review of Systems  Constitutional:  Positive for activity change, appetite change and fever.  HENT:  Negative for congestion and rhinorrhea.   Respiratory:  Negative for cough, chest tightness and shortness of breath.   Gastrointestinal:  Positive for abdominal pain, diarrhea, nausea and vomiting.  Genitourinary:  Negative for dysuria and hematuria.   Musculoskeletal:  Negative for arthralgias and myalgias.  Skin:  Negative for rash.  Neurological:  Negative for dizziness, weakness and headaches.   all other systems are negative except as noted in the HPI and PMH.    Updated Vital Signs BP 126/76   Pulse 98   Temp 98.1 F (36.7 C) (Oral)   Resp 20   Ht 6' 1 (1.854 m)   Wt 90.3 kg   SpO2 98%   BMI 26.25 kg/m   Physical Exam Vitals and nursing note reviewed.  Constitutional:      General: He is not in acute distress.    Appearance: He is well-developed.  HENT:     Head: Normocephalic and atraumatic.     Mouth/Throat:     Pharynx: No oropharyngeal exudate.  Eyes:     Conjunctiva/sclera: Conjunctivae normal.     Pupils: Pupils are equal, round, and reactive to light.  Neck:     Comments: No meningismus. Cardiovascular:     Rate and Rhythm: Normal rate and regular rhythm.     Heart sounds: Normal heart sounds. No murmur heard. Pulmonary:     Effort: Pulmonary effort is normal. No respiratory distress.     Breath sounds: Normal breath sounds.  Abdominal:     Palpations: Abdomen is soft.     Tenderness: There is abdominal tenderness. There is guarding. There is no rebound.     Comments: Tender right lower quadrant voluntary guarding, no rebound  Genitourinary:    Comments: Large right inguinal hernia that is soft and partially reducible Musculoskeletal:        General: No tenderness. Normal range of motion.     Cervical back: Normal range of motion and neck supple.  Skin:    General: Skin is warm.  Neurological:     Mental Status: He is alert and oriented to person, place, and time.     Cranial Nerves: No cranial nerve deficit.     Motor: No abnormal muscle tone.     Coordination: Coordination normal.     Comments:  5/5 strength throughout. CN 2-12 intact.Equal grip strength.   Psychiatric:        Behavior: Behavior normal.     (all labs ordered are listed, but only abnormal results are displayed) Labs  Reviewed  COMPREHENSIVE METABOLIC PANEL WITH GFR - Abnormal; Notable for the following components:      Result Value   CO2 21 (*)    Glucose, Bld 120 (*)    Total Protein 6.2 (*)    Alkaline Phosphatase 35 (*)    All other components within normal limits  CBC - Abnormal; Notable for the following components:   Platelets 119 (*)    All other components within normal limits  TROPONIN  I (HIGH SENSITIVITY) - Abnormal; Notable for the following components:   Troponin I (High Sensitivity) 39 (*)    All other components within normal limits  TROPONIN I (HIGH SENSITIVITY) - Abnormal; Notable for the following components:   Troponin I (High Sensitivity) 44 (*)    All other components within normal limits  LIPASE, BLOOD  URINALYSIS, ROUTINE W REFLEX MICROSCOPIC  I-STAT CG4 LACTIC ACID, ED    EKG: EKG Interpretation Date/Time:  Wednesday April 02 2024 01:14:26 EDT Ventricular Rate:  105 PR Interval:  142 QRS Duration:  179 QT Interval:  398 QTC Calculation: 527 R Axis:   96  Text Interpretation: Sinus or ectopic atrial tachycardia Nonspecific intraventricular conduction delay Borderline ST depression, inferior leads Abnormal T, consider ischemia, lateral leads Rate faster Confirmed by Carita Senior [phone]) on 04/02/2024 1:16:40 AM  Radiology: CT ABDOMEN PELVIS WO CONTRAST Result Date: 04/02/2024 CLINICAL DATA:  Acute nonlocalized abdominal pain EXAM: CT ABDOMEN AND PELVIS WITHOUT CONTRAST TECHNIQUE: Multidetector CT imaging of the abdomen and pelvis was performed following the standard protocol without IV contrast. RADIATION DOSE REDUCTION: This exam was performed according to the departmental dose-optimization program which includes automated exposure control, adjustment of the mA and/or kV according to patient size and/or use of iterative reconstruction technique. COMPARISON:  03/31/2024 FINDINGS: Lower chest: No acute abnormality. Hepatobiliary: No focal liver abnormality is seen. No  gallstones, gallbladder wall thickening, or biliary dilatation. Pancreas: Unremarkable Spleen: Unremarkable Adrenals/Urinary Tract: Adrenal glands are unremarkable. Simple cortical cysts are seen within the kidneys bilaterally for which no follow-up imaging is recommended. The kidneys are otherwise unremarkable. Bladder unremarkable. Stomach/Bowel: Dilated gas and fluid-filled loops of small bowel are again identified involving the proximal in mid small bowel with loops of bowel measuring up to 4.5 cm in diameter. Similar prior examination, there is a notable caliber change involving the small bowel as it passes into the large right hernia sac in keeping with the point of transition for the small-bowel obstruction. Similar prior examination, the hernia sac contains multiple loops of unremarkable small bowel as well as a small amount of free fluid. Mild descending colonic diverticulosis. The stomach, large, and small bowel are otherwise unremarkable. The appendix is not visualized and may be absent. No free intraperitoneal gas. Vascular/Lymphatic: Aortic atherosclerosis. No enlarged abdominal or pelvic lymph nodes. Reproductive: Mild prostatic hypertrophy Other: Large right and small left inguinal hernias are present. Musculoskeletal: No acute bone abnormality. No lytic or blastic bone lesion. Osseous structures are age appropriate. IMPRESSION: 1. Persistent small bowel obstruction with point of transition involving the small bowel as it passes into the large right inguinal hernia sac. 2. Mild descending colonic diverticulosis. 3. Large right and small left inguinal hernias. 4. Mild prostatic hypertrophy. 5. Aortic atherosclerosis. Aortic Atherosclerosis (ICD10-I70.0). Electronically Signed   By: Dorethia Molt M.D.   On: 04/02/2024 03:56   DG Abd Portable 1 View Result Date: 03/31/2024 EXAM: 1 VIEW XRAY OF THE ABDOMEN 03/31/2024 02:29:00 PM COMPARISON: None available. CLINICAL HISTORY: Post-NG. Reason for exam:  Post-NG; Per triage notes: Pt BIB EMS from home for N/V starting this morning and complaints of being cold. Initial oral temp with EMS 94.2 but repeat was 97. Pt has a defibrillator, battery replaced 2 months ago. Hx of previous heart valve infection, redone in 2015. AOX4. FINDINGS: BOWEL: Partially visualized distended small bowel loops in the upper abdomen. SOFT TISSUES: No opaque urinary calculi. Left chest wall single lead cardiac defibrillator noted. BONES: No acute osseous abnormality. LINES AND  TUBES: Enteric tube in place with distal tip and side port in the proximal stomach. IMPRESSION: 1. Enteric tube in place with distal tip and side port in the proximal stomach. 2. Partially visualized distended small bowel loops in the upper abdomen, compatible with distal small bowel obstruction seen on CT from earlier today. Electronically signed by: Selinda Blue MD 03/31/2024 03:00 PM EDT RP Workstation: [id number]   CT ABDOMEN PELVIS WO CONTRAST Result Date: 03/31/2024 EXAM: CT ABDOMEN AND PELVIS WITHOUT CONTRAST 03/31/2024 12:59:00 PM TECHNIQUE: CT of the abdomen and pelvis was performed without the administration of intravenous contrast. Multiplanar reformatted images are provided for review. Automated exposure control, iterative reconstruction, and/or weight-based adjustment of the mA/kV was utilized to reduce the radiation dose to as low as reasonably achievable. COMPARISON: 03/16/23 CT abdomen/pelvis CLINICAL HISTORY: Abd pain, vomiting. No contrast; Pt BIB EMS from home for N/V starting this morning and complaints of being cold. Initial oral temp with EMS 94.2 but repeat was 97. Pt has a defibrillator, battery replaced 2 months ago. Hx of previous heart valve infection, redone in 2015. AOX4. FINDINGS: LOWER CHEST: Intact lower sternotomy wires. ICD lead indeterminant in the right ventricular apex. Very middle lobe 4 mm pulmonary nodule on series 5/image 6 is unchanged from 10/23/13 chest CT and considered  benign. No acute abnormality of the lung bases. Fluid in the lower thoracic esophagus. LIVER: Chronic small coarse dystrophic calcification in the posterior right liver. Otherwise normal liver with no liver masses. GALLBLADDER AND BILE DUCTS: No wall thickening. No cholelithiasis. No biliary ductal dilatation. SPLEEN: Normal size. No focal lesion. PANCREAS: No mass. No ductal dilatation. ADRENAL GLANDS: Stable 1.3 cm left adrenal nodule with density negative for HU compatible with an adenoma. KIDNEYS, URETERS AND BLADDER: Scattered small simple bilateral renal cysts, largest 2.5 cm in the lateral upper right kidney, for which no follow up imaging is recommended. No stones in the kidneys or ureters. No hydronephrosis. No perinephric or periureteral stranding. Normal nondistended bladder. GI AND BOWEL: Large right inguinal hernia containing multiple small bowel loops. Diffuse dilatation of the small bowel up to 4 cm diameter proximal to the right inguinal hernia, with diffuse small bowel air-fluid levels compatible with small bowel obstruction. Small amount of free fluid in the right inguinal hernia sac. Appendix not discretely visualized. Left colonic diverticulosis with no large bowel wall thickening or significant pericolonic fat stranding. No definite pneumatosis or bowel wall thickening. PERITONEUM AND RETROPERITONEUM: Trace right-sided ascites. No focal fluid collection. VASCULATURE: Atherosclerotic nonaneurysmal abdominal aorta. LYMPH NODES: No lymphadenopathy. REPRODUCTIVE ORGANS: Chronic mild prostatomegaly. BONES AND SOFT TISSUES: Moderate thoracolumbar spondylosis. No acute osseous abnormality. No focal soft tissue abnormality. IMPRESSION: 1. High-grade distal small bowel obstruction at the level of the large right inguinal hernia containing multiple distal small bowel loops. 2. Trace right-sided ascites and small volume fluid within the right inguinal hernia sac. No free air or definite pneumatosis. 3.  Mild left colonic diverticulosis. 4. Stable small left adrenal adenoma, for which no follow-up imaging is recommended. Electronically signed by: Selinda Blue MD 03/31/2024 01:38 PM EDT RP Workstation: [id number]     Procedures   Medications Ordered in the ED  lactated ringers  bolus 1,000 mL (has no administration in time range)                                    Medical Decision Making Amount and/or Complexity of Data Reviewed Labs:  ordered. Decision-making details documented in ED Course. Radiology: ordered and independent interpretation performed. Decision-making details documented in ED Course. ECG/medicine tests: ordered and independent interpretation performed. Decision-making details documented in ED Course.  Risk Prescription drug management. Decision regarding hospitalization.   Recent admission for small bowel obstruction here with nausea, vomiting, fever, abdominal pain and diarrhea.  Afebrile with stable vitals on arrival.  Abdomen soft without peritoneal signs.  Hernia soft and reducible.  Labs show no significant leukocytosis.  LFTs and lipase are at baseline.  EKG is unchanged.  Troponin minimally elevated but flat.  CT scan shows recurrent small bowel obstruction with transition point near hernia sac in the right lower quadrant.  Patient's hernia is partially reducible but not fully.  Discussed with general surgery Dr. Lyndel.   He will see patient.  Agreed with IV fluids and NG tube.  Dr. Lyndel was able to reduce hernia fully and feels NG tube can be held at this time unless patient starts vomiting.  Plan admission to the hospital for recurrent small bowel obstruction at the site of inguinal hernia with nausea and vomiting.  Discussed with Dr. Franky     Final diagnoses:  Small bowel obstruction [facility])  Incarcerated inguinal hernia    ED Discharge Orders     None          Inmer Nix, Garnette, MD 04/02/24 [id number]

## 2024-04-03 ENCOUNTER — Encounter (HOSPITAL_COMMUNITY)
Admission: EM | Disposition: A | Discharge status: Home / Self Care | Payer: Self-pay | Source: Home / Self Care | Attending: Internal Medicine

## 2024-04-03 ENCOUNTER — Other Ambulatory Visit: Payer: Self-pay

## 2024-04-03 ENCOUNTER — Inpatient Hospital Stay (HOSPITAL_COMMUNITY): Admitting: Anesthesiology

## 2024-04-03 ENCOUNTER — Encounter (HOSPITAL_COMMUNITY): Payer: Self-pay | Admitting: Internal Medicine

## 2024-04-03 DIAGNOSIS — K4030 Unilateral inguinal hernia, with obstruction, without gangrene, not specified as recurrent: Secondary | ICD-10-CM | POA: Diagnosis not present

## 2024-04-03 DIAGNOSIS — N182 Chronic kidney disease, stage 2 (mild): Secondary | ICD-10-CM | POA: Diagnosis not present

## 2024-04-03 DIAGNOSIS — K4090 Unilateral inguinal hernia, without obstruction or gangrene, not specified as recurrent: Secondary | ICD-10-CM | POA: Diagnosis not present

## 2024-04-03 DIAGNOSIS — I5042 Chronic combined systolic (congestive) and diastolic (congestive) heart failure: Secondary | ICD-10-CM

## 2024-04-03 DIAGNOSIS — I132 Hypertensive heart and chronic kidney disease with heart failure and with stage 5 chronic kidney disease, or end stage renal disease: Secondary | ICD-10-CM | POA: Diagnosis not present

## 2024-04-03 HISTORY — PX: FETAL SURGERY FOR CONGENITAL HERNIA: SHX1618

## 2024-04-03 HISTORY — PX: INSERTION OF MESH: SHX5868

## 2024-04-03 HISTORY — PX: INGUINAL HERNIA REPAIR: SHX194

## 2024-04-03 LAB — BASIC METABOLIC PANEL WITH GFR
Anion gap: 10 (ref 5–15)
BUN: 21 mg/dL (ref 8–23)
CO2: 22 mmol/L (ref 22–32)
Calcium: 8.4 mg/dL — ABNORMAL LOW (ref 8.9–10.3)
Chloride: 108 mmol/L (ref 98–111)
Creatinine, Ser: 1.15 mg/dL (ref 0.61–1.24)
GFR, Estimated: >60 mL/min (ref >60)
Glucose, Bld: 104 mg/dL — ABNORMAL HIGH (ref 70–99)
Potassium: 3.5 mmol/L (ref 3.5–5.1)
Sodium: 140 mmol/L (ref 135–145)

## 2024-04-03 LAB — SURGICAL PCR SCREEN
MRSA, PCR: NEGATIVE
Staphylococcus aureus: NEGATIVE

## 2024-04-03 LAB — CBC
HCT: 40.8 % (ref 39.0–52.0)
Hemoglobin: 13.9 g/dL (ref 13.0–17.0)
MCH: 31.8 pg (ref 26.0–34.0)
MCHC: 34.1 g/dL (ref 30.0–36.0)
MCV: 93.4 fL (ref 80.0–100.0)
Platelets: 97 K/uL — ABNORMAL LOW (ref 150–400)
RBC: 4.37 MIL/uL (ref 4.22–5.81)
RDW: 13.5 % (ref 11.5–15.5)
WBC: 5.1 K/uL (ref 4.0–10.5)
nRBC: 0 % (ref 0.0–0.2)

## 2024-04-03 LAB — GLUCOSE, CAPILLARY: Glucose-Capillary: 163 mg/dL — ABNORMAL HIGH (ref 70–99)

## 2024-04-03 SURGERY — REPAIR, HERNIA, INGUINAL, ADULT
Anesthesia: General | Site: Inguinal | Laterality: Right

## 2024-04-03 MED ORDER — TRAMADOL HCL 50 MG PO TABS
50.0000 mg | ORAL_TABLET | Freq: Four times a day (QID) | ORAL | Status: DC | PRN
  Administered 2024-04-04: 50 mg via ORAL
  Filled 2024-04-03: qty 1

## 2024-04-03 MED ORDER — LIDOCAINE 2% (20 MG/ML) 5 ML SYRINGE
INTRAMUSCULAR | Status: DC | PRN
  Administered 2024-04-03: 60 mg via INTRAVENOUS

## 2024-04-03 MED ORDER — TRAMADOL HCL 50 MG PO TABS: 50.0000 mg | ORAL_TABLET | Freq: Four times a day (QID) | ORAL | 0 refills | Status: AC | PRN

## 2024-04-03 MED ORDER — BUPIVACAINE-EPINEPHRINE (PF) 0.25% -1:200000 IJ SOLN
INTRAMUSCULAR | Status: AC
  Filled 2024-04-03: qty 30

## 2024-04-03 MED ORDER — ASPIRIN 81 MG PO TBEC: 81.0000 mg | DELAYED_RELEASE_TABLET | Freq: Every day | ORAL | Status: DC

## 2024-04-03 MED ORDER — LACTATED RINGERS IV SOLN
INTRAVENOUS | Status: DC | PRN
Start: 2024-04-03 — End: 2024-04-03

## 2024-04-03 MED ORDER — FENTANYL CITRATE (PF) 250 MCG/5ML IJ SOLN
INTRAMUSCULAR | Status: AC
  Filled 2024-04-03: qty 5

## 2024-04-03 MED ORDER — ONDANSETRON HCL 4 MG/2ML IJ SOLN: 4.0000 mg | Freq: Once | INTRAMUSCULAR | Status: DC | PRN

## 2024-04-03 MED ORDER — DEXAMETHASONE SODIUM PHOSPHATE 10 MG/ML IJ SOLN
INTRAMUSCULAR | Status: DC | PRN
  Administered 2024-04-03: 10 mg via INTRAVENOUS

## 2024-04-03 MED ORDER — FENTANYL CITRATE (PF) 100 MCG/2ML IJ SOLN: 25.0000 ug | INTRAMUSCULAR | Status: DC | PRN

## 2024-04-03 MED ORDER — CHLORHEXIDINE GLUCONATE 0.12 % MT SOLN
15.0000 mL | Freq: Once | OROMUCOSAL | Status: AC
  Administered 2024-04-03: 15 mL via OROMUCOSAL
  Filled 2024-04-03: qty 15

## 2024-04-03 MED ORDER — MIDAZOLAM HCL 2 MG/2ML IJ SOLN
INTRAMUSCULAR | Status: AC
  Filled 2024-04-03: qty 2

## 2024-04-03 MED ORDER — BUPIVACAINE-EPINEPHRINE (PF) 0.25% -1:200000 IJ SOLN
INTRAMUSCULAR | Status: DC | PRN
  Administered 2024-04-03: 30 mL

## 2024-04-03 MED ORDER — DAPAGLIFLOZIN PROPANEDIOL 10 MG PO TABS
10.0000 mg | ORAL_TABLET | Freq: Every day | ORAL | Status: DC
  Administered 2024-04-03 – 2024-04-04 (×2): 10 mg via ORAL
  Filled 2024-04-03 (×2): qty 1

## 2024-04-03 MED ORDER — PHENYLEPHRINE HCL-NACL 20-0.9 MG/250ML-% IV SOLN
INTRAVENOUS | Status: DC | PRN
  Administered 2024-04-03: 50 ug/min via INTRAVENOUS

## 2024-04-03 MED ORDER — ORAL CARE MOUTH RINSE: 15.0000 mL | Freq: Once | OROMUCOSAL | Status: AC

## 2024-04-03 MED ORDER — SUCCINYLCHOLINE CHLORIDE 200 MG/10ML IV SOSY
PREFILLED_SYRINGE | INTRAVENOUS | Status: DC | PRN
Start: 2024-04-03 — End: 2024-04-03
  Administered 2024-04-03: 100 mg via INTRAVENOUS

## 2024-04-03 MED ORDER — SACUBITRIL-VALSARTAN 24-26 MG PO TABS
1.0000 | ORAL_TABLET | Freq: Two times a day (BID) | ORAL | Status: DC
  Administered 2024-04-03 – 2024-04-04 (×2): 1 via ORAL
  Filled 2024-04-03 (×2): qty 1

## 2024-04-03 MED ORDER — PROPOFOL 10 MG/ML IV BOLUS
INTRAVENOUS | Status: AC
  Filled 2024-04-03: qty 20

## 2024-04-03 MED ORDER — ACETAMINOPHEN 650 MG RE SUPP
650.0000 mg | Freq: Four times a day (QID) | RECTAL | Status: DC
  Filled 2024-04-03: qty 1

## 2024-04-03 MED ORDER — CEFAZOLIN SODIUM-DEXTROSE 2-3 GM-%(50ML) IV SOLR
INTRAVENOUS | Status: DC | PRN
  Administered 2024-04-03: 2 g via INTRAVENOUS

## 2024-04-03 MED ORDER — ROCURONIUM BROMIDE 100 MG/10ML IV SOLN
INTRAVENOUS | Status: DC | PRN
  Administered 2024-04-03: 30 mg via INTRAVENOUS

## 2024-04-03 MED ORDER — 0.9 % SODIUM CHLORIDE (POUR BTL) OPTIME
TOPICAL | Status: DC | PRN
  Administered 2024-04-03: 1000 mL

## 2024-04-03 MED ORDER — PROPOFOL 10 MG/ML IV BOLUS
INTRAVENOUS | Status: DC | PRN
  Administered 2024-04-03 (×2): 50 mg via INTRAVENOUS

## 2024-04-03 MED ORDER — CARVEDILOL 3.125 MG PO TABS
3.1250 mg | ORAL_TABLET | Freq: Two times a day (BID) | ORAL | Status: DC
  Administered 2024-04-03 – 2024-04-04 (×2): 3.125 mg via ORAL
  Filled 2024-04-03 (×2): qty 1

## 2024-04-03 MED ORDER — ACETAMINOPHEN 500 MG PO TABS
1000.0000 mg | ORAL_TABLET | Freq: Four times a day (QID) | ORAL | Status: DC
  Administered 2024-04-03 – 2024-04-04 (×4): 1000 mg via ORAL
  Filled 2024-04-03 (×4): qty 2

## 2024-04-03 MED ORDER — SUGAMMADEX SODIUM 200 MG/2ML IV SOLN
INTRAVENOUS | Status: DC | PRN
  Administered 2024-04-03: 200 mg via INTRAVENOUS

## 2024-04-03 MED ORDER — LACTATED RINGERS IV SOLN: INTRAVENOUS | Status: DC

## 2024-04-03 MED ORDER — MIDAZOLAM HCL 2 MG/2ML IJ SOLN
INTRAMUSCULAR | Status: DC | PRN
  Administered 2024-04-03: 2 mg via INTRAVENOUS

## 2024-04-03 MED ORDER — FENTANYL CITRATE (PF) 250 MCG/5ML IJ SOLN
INTRAMUSCULAR | Status: DC | PRN
  Administered 2024-04-03 (×3): 50 ug via INTRAVENOUS
  Administered 2024-04-03 (×2): 25 ug via INTRAVENOUS

## 2024-04-03 MED ORDER — ONDANSETRON HCL 4 MG/2ML IJ SOLN
INTRAMUSCULAR | Status: DC | PRN
  Administered 2024-04-03: 4 mg via INTRAVENOUS

## 2024-04-03 MED ORDER — PHENYLEPHRINE 80 MCG/ML (10ML) SYRINGE FOR IV PUSH (FOR BLOOD PRESSURE SUPPORT)
PREFILLED_SYRINGE | INTRAVENOUS | Status: DC | PRN
  Administered 2024-04-03 (×2): 80 ug via INTRAVENOUS
  Administered 2024-04-03: 160 ug via INTRAVENOUS

## 2024-04-03 SURGICAL SUPPLY — 28 items
BLADE CLIPPER SURG (BLADE) IMPLANT
CANISTER SUCTION 3000ML PPV (SUCTIONS) IMPLANT
COVER SURGICAL LIGHT HANDLE (MISCELLANEOUS) ×1 IMPLANT
DERMABOND ADVANCED .7 DNX12 (GAUZE/BANDAGES/DRESSINGS) ×1 IMPLANT
DRAIN PENROSE .5X12 LATEX STL (DRAIN) IMPLANT
DRAPE LAPAROTOMY TRNSV 102X78 (DRAPES) ×1 IMPLANT
ELECTRODE REM PT RTRN 9FT ADLT (ELECTROSURGICAL) ×1 IMPLANT
GAUZE 4X4 16PLY ~~LOC~~+RFID DBL (SPONGE) ×1 IMPLANT
GLOVE BIOGEL PI MICRO STRL 6 (GLOVE) ×1 IMPLANT
GLOVE INDICATOR 6.5 STRL GRN (GLOVE) ×1 IMPLANT
GOWN STRL REUS W/ TWL LRG LVL3 (GOWN DISPOSABLE) ×2 IMPLANT
KIT BASIN OR (CUSTOM PROCEDURE TRAY) ×1 IMPLANT
KIT TURNOVER KIT B (KITS) ×1 IMPLANT
MESH PARIETEX PROGRIP RIGHT (Mesh General) ×1 IMPLANT
NDL 22X1.5 STRL (OR ONLY) (MISCELLANEOUS) ×1 IMPLANT
NEEDLE 22X1.5 STRL (OR ONLY) (MISCELLANEOUS) ×1 IMPLANT
NS IRRIG 1000ML POUR BTL (IV SOLUTION) ×1 IMPLANT
PACK GENERAL/GYN (CUSTOM PROCEDURE TRAY) ×1 IMPLANT
PAD ARMBOARD POSITIONER FOAM (MISCELLANEOUS) ×1 IMPLANT
SPONGE INTESTINAL PEANUT (DISPOSABLE) IMPLANT
SUT MNCRL AB 4-0 PS2 18 (SUTURE) ×1 IMPLANT
SUT SILK 2 0 SH (SUTURE) IMPLANT
SUT SILK 2 0 TIES 10X30 (SUTURE) IMPLANT
SUT VIC AB 0 CT2 27 (SUTURE) ×1 IMPLANT
SUT VIC AB 2-0 SH 27X BRD (SUTURE) ×1 IMPLANT
SUT VIC AB 3-0 SH 8-18 (SUTURE) ×1 IMPLANT
SYR CONTROL 10ML LL (SYRINGE) ×1 IMPLANT
TOWEL GREEN STERILE (TOWEL DISPOSABLE) ×1 IMPLANT

## 2024-04-03 NOTE — Progress Notes (Signed)
 Arrived from PACU, VSS and afebrile.  Denies pain.  Patient resting.  Call bell within reach,

## 2024-04-03 NOTE — Progress Notes (Signed)
 [facility] Progress Note Patient: Bradley [name]. FMW:[id number] DOB: 1955-11-30  DOA: 04/02/2024 DOS: the patient was seen and examined on 04/03/2024  Brief Hospital Course:  Azazel Franze. is a 68 y.o. male with medical history significant of HFrEF / NICM with VT s/p ICD placement 02/11/2024, congenital AS s/p balloon valvotomy at age 60 / AV repair 1968, CAD HTN, thyroid  cancer s/p thyroidectomy with residual hypothyroidism, and MDD w/ psychotic presents with vomiting, diarrhea, and abdominal discomfort.  Found to have bowel obstruction due to inguinal hernia.  Underwent open right inguinal hernia repair. Monitor overnight for stability. Assessment and Plan: Small bowel obstruction secondary to inguinal hernia Recurrent. General Surgery was consulted. Open right inguinal hernia repair. Monitor overnight for stability and oral intake tolerance.   Chronic heart failure with reduced EF Patient appears to be euvolemic on physical exam.  Last available echocardiogram from 06/2023 noted EF to be 20 to 25% with grade 1 diastolic dysfunction Appreciate cardiology consultation.   Elevated troponin CAD Acute.  High-sensitivity troponins 39->44. No chest pain. Cardiology consult appreciated.  Continue current medication without any changes.  Thrombocytopenia Platelet count low and trending down. Monitor overnight for bleeding. Patient has normal platelet count at his baseline with intermittent worsening when he is hospitalized likely in the event of stress.   History of congenital AV stenosis s/p valvotomy, [place] 2001 with tissue valve, and redo in 2015 Stable per last echocardiogram in 06/2023.   History of VT S/p ICD Continue mexiletine and Ranexa   QTc is prolonged.  Monitor on telemetry.    History of thyroid  cancer Postsurgical hypothyroidism - Continue levothyroxine     Depression and anxiety - Continue Zoloft , Abilify , and clonazepam    GERD - Continue pharmacy  substitution Protonix    Subjective: Denies any acute nausea or vomiting.  Abdominal pain still present.  Not passing gas.  No fever no chills.  Physical Exam: Clear to auscultation. S1-S2 present Bowel sound present.  Diffusely tender. No edema.  Data Reviewed: I have Reviewed nursing notes, Vitals, and Lab results. Since last encounter, pertinent lab results CBC and BMP   . I have ordered test including CBC and CMP  . I have discussed pt's care plan and test results with general surgery  .   Disposition: Status is: Inpatient Remains inpatient appropriate because: Monitor overnight for stability   Family Communication: Family at bedside Level of care: Telemetry Medical   Vitals:   04/03/24 1045 04/03/24 1046 04/03/24 1108 04/03/24 1544  BP: (!) 145/109 131/83 133/87 130/85  Pulse: 83 86 85 90  Resp: 13 16 17 16   Temp:  98.5 F (36.9 C) 97.9 F (36.6 C) 97.7 F (36.5 C)  TempSrc:   Oral Oral  SpO2: 94% 95% 93% 94%  Weight:      Height:         Author: Yetta Blanch, MD 04/03/2024 6:18 PM  Please look on [email or web address] to find out who is on call.

## 2024-04-03 NOTE — Progress Notes (Signed)
 Progress Note  * Day of Surgery *  Interval: Cardiology evaluated, moderate but acceptable surgical risk  Objective: Vital signs in last 24 hours: Temp:  [97.6 F (36.4 C)-98.9 F (37.2 C)] 98.9 F (37.2 C) (09/04 0642) Pulse Rate:  [78-89] 82 (09/04 0642) Resp:  [12-21] 18 (09/04 0642) BP: (115-142)/(63-91) 124/84 (09/04 0642) SpO2:  [93 %-98 %] 95 % (09/04 0642) Weight:  [90 kg] 90 kg (09/04 0642) Last BM Date : 04/02/24  Intake/Output from previous day: 09/03 0701 - 09/04 0700 In: 784.3 [P.O.:100; I.V.:684.3] Out: -  Intake/Output this shift: Total I/O In: 533.4 [P.O.:100; I.V.:433.4] Out: -   PE: General: NAD HEENT: NGT clamped  Heart: regular, rate, and rhythm Lungs: Normal work of breathing on room air Abd: soft, NT, ND. RIH soft, partially reducible MS: all 4 extremities are symmetrical with no cyanosis, clubbing, or edema. Skin: warm and dry with no masses, lesions, or rashes Neuro: No focal neurologic deficits Psych: A&Ox3 with an appropriate affect.    Lab Results:  Recent Labs    04/02/24 0043 04/03/24 0305  WBC 5.5 5.1  HGB 15.2 13.9  HCT 46.2 40.8  PLT 119* 97*   BMET Recent Labs    04/02/24 0043 04/03/24 0305  NA 140 140  K 3.8 3.5  CL 107 108  CO2 21* 22  GLUCOSE 120* 104*  BUN 23 21  CREATININE 1.19 1.15  CALCIUM  9.0 8.4*   PT/INR No results for input(s): LABPROT, INR in the last 72 hours. CMP     Component Value Date/Time   NA 140 04/03/2024 0305   NA 144 01/29/2024 1237   K 3.5 04/03/2024 0305   CL 108 04/03/2024 0305   CO2 22 04/03/2024 0305   GLUCOSE 104 (H) 04/03/2024 0305   BUN 21 04/03/2024 0305   BUN 22 01/29/2024 1237   CREATININE 1.15 04/03/2024 0305   CREATININE 1.28 10/14/2015 1228   CALCIUM  8.4 (L) 04/03/2024 0305   PROT 6.2 (L) 04/02/2024 0043   ALBUMIN  3.5 04/02/2024 0043   AST 22 04/02/2024 0043   ALT 19 04/02/2024 0043   ALKPHOS 35 (L) 04/02/2024 0043   BILITOT 0.9 04/02/2024 0043   GFRNONAA  >60 04/03/2024 0305   GFRAA 74 04/13/2020 1609   Lipase     Component Value Date/Time   LIPASE 24 04/02/2024 0043       Studies/Results: CT ABDOMEN PELVIS WO CONTRAST Result Date: 04/02/2024 CLINICAL DATA:  Acute nonlocalized abdominal pain EXAM: CT ABDOMEN AND PELVIS WITHOUT CONTRAST TECHNIQUE: Multidetector CT imaging of the abdomen and pelvis was performed following the standard protocol without IV contrast. RADIATION DOSE REDUCTION: This exam was performed according to the departmental dose-optimization program which includes automated exposure control, adjustment of the mA and/or kV according to patient size and/or use of iterative reconstruction technique. COMPARISON:  03/31/2024 FINDINGS: Lower chest: No acute abnormality. Hepatobiliary: No focal liver abnormality is seen. No gallstones, gallbladder wall thickening, or biliary dilatation. Pancreas: Unremarkable Spleen: Unremarkable Adrenals/Urinary Tract: Adrenal glands are unremarkable. Simple cortical cysts are seen within the kidneys bilaterally for which no follow-up imaging is recommended. The kidneys are otherwise unremarkable. Bladder unremarkable. Stomach/Bowel: Dilated gas and fluid-filled loops of small bowel are again identified involving the proximal in mid small bowel with loops of bowel measuring up to 4.5 cm in diameter. Similar prior examination, there is a notable caliber change involving the small bowel as it passes into the large right hernia sac in keeping with the point of  transition for the small-bowel obstruction. Similar prior examination, the hernia sac contains multiple loops of unremarkable small bowel as well as a small amount of free fluid. Mild descending colonic diverticulosis. The stomach, large, and small bowel are otherwise unremarkable. The appendix is not visualized and may be absent. No free intraperitoneal gas. Vascular/Lymphatic: Aortic atherosclerosis. No enlarged abdominal or pelvic lymph nodes.  Reproductive: Mild prostatic hypertrophy Other: Large right and small left inguinal hernias are present. Musculoskeletal: No acute bone abnormality. No lytic or blastic bone lesion. Osseous structures are age appropriate. IMPRESSION: 1. Persistent small bowel obstruction with point of transition involving the small bowel as it passes into the large right inguinal hernia sac. 2. Mild descending colonic diverticulosis. 3. Large right and small left inguinal hernias. 4. Mild prostatic hypertrophy. 5. Aortic atherosclerosis. Aortic Atherosclerosis (ICD10-I70.0). Electronically Signed   By: Dorethia Molt M.D.   On: 04/02/2024 03:56     Assessment/Plan 68 yo M with AS, CHF, AVR on antiplatelet therapy who presented with incarcerated RIH with obstructive symptoms, now reduced. Also has asymptomatic LIH that seems to be fat containing.   LOS: 1 day   - Proceed to OR this AM for open right inguinal hernia repair with mesh - I discussed risks of surgery with patient including but not limited to: bleeding, infection, hematoma, seroma, injury to surrounding structures, need for bowel resection, nerve injury, chronic pain, injury to spermatic vessels that may compromise blood supply to testicle, injury to vas deferens decreasing fertility, need for take back to OR, possibility of needing laparotomy to help reduce hernia if unable to reduce through inguinal incision. Patient voiced understanding and wishes to proceed with OR.  I reviewed hospitalist notes, last 24 h vitals and pain scores, last 48 h intake and output, last 24 h labs and trends, and last 24 h imaging results.  This care required high  level of medical decision making.    Richerd Silversmith, MD [facility] 04/03/2024, 6:57 AM Please see Amion for pager number during day hours 7:00am-4:30pm

## 2024-04-03 NOTE — Transfer of Care (Signed)
 Immediate Anesthesia Transfer of Care Note  Patient: Bradley [name].  Procedure(s) Performed: REPAIR, HERNIA, INGUINAL, ADULT (Right: Inguinal) INSERTION OF MESH (Right: Inguinal)  Patient Location: PACU  Anesthesia Type:General  Level of Consciousness: drowsy and patient cooperative  Airway & Oxygen Therapy: Patient Spontanous Breathing and Patient connected to face mask oxygen  Post-op Assessment: Report given to RN, Post -op Vital signs reviewed and stable, Patient moving all extremities, Patient moving all extremities X 4, and Patient able to stick tongue midline  Post vital signs: Reviewed and stable  Last Vitals:  Vitals Value Taken Time  BP 128/81 04/03/24 10:15  Temp    Pulse 81 04/03/24 10:18  Resp 16 04/03/24 10:18  SpO2 94 % 04/03/24 10:18  Vitals shown include unfiled device data.  Last Pain:  Vitals:   04/03/24 0716  TempSrc:   PainSc: 0-No pain         Complications: No notable events documented.

## 2024-04-03 NOTE — Anesthesia Procedure Notes (Signed)
 Procedure Name: LMA Insertion Date/Time: 04/03/2024 8:12 AM  Performed by: Arvell Edsel HERO, CRNAPre-anesthesia Checklist: Emergency Drugs available, Patient identified, Suction available, Patient being monitored and Timeout performed Patient Re-evaluated:Patient Re-evaluated prior to induction Oxygen Delivery Method: Circle system utilized Preoxygenation: Pre-oxygenation with 100% oxygen Induction Type: IV induction Ventilation: Mask ventilation without difficulty LMA: LMA inserted LMA Size: 5.0 Number of attempts: 1 Placement Confirmation: positive ETCO2, CO2 detector and breath sounds checked- equal and bilateral Tube secured with: Tape

## 2024-04-03 NOTE — Plan of Care (Signed)
  Problem: Pain Managment: Goal: General experience of comfort will improve and/or be controlled Outcome: Progressing   Problem: Safety: Goal: Ability to remain free from injury will improve Outcome: Progressing

## 2024-04-03 NOTE — Anesthesia Procedure Notes (Signed)
 Procedure Name: Intubation Date/Time: 04/03/2024 9:15 AM  Performed by: Arvell Edsel HERO, CRNAPre-anesthesia Checklist: Patient identified, Emergency Drugs available, Suction available, Patient being monitored and Timeout performed Patient Re-evaluated:Patient Re-evaluated prior to induction Oxygen Delivery Method: Circle system utilized Preoxygenation: Pre-oxygenation with 100% oxygen Induction Type: IV induction Laryngoscope Size: Mac and 3 Grade View: Grade II Tube size: 7.0 mm Number of attempts: 1 Airway Equipment and Method: Stylet and Patient positioned with wedge pillow Placement Confirmation: ETT inserted through vocal cords under direct vision, positive ETCO2, breath sounds checked- equal and bilateral and CO2 detector Secured at: 23 cm Tube secured with: Tape

## 2024-04-03 NOTE — Hospital Course (Signed)
 Bradley [name]. is a 68 y.o. male with medical history significant of HFrEF / NICM with VT s/p ICD placement 02/11/2024, congenital AS s/p balloon valvotomy at age 80 / AV repair 1968, CAD HTN, thyroid  cancer s/p thyroidectomy with residual hypothyroidism, and MDD w/ psychotic presents with vomiting, diarrhea, and abdominal discomfort.  Found to have bowel obstruction due to inguinal hernia.  Underwent open right inguinal hernia repair. Monitor overnight for stability. Assessment and Plan: Small bowel obstruction secondary to inguinal hernia Recurrent. General Surgery was consulted. Open right inguinal hernia repair. Monitor overnight for stability and oral intake tolerance.   Chronic heart failure with reduced EF Patient appears to be euvolemic on physical exam.  Last available echocardiogram from 06/2023 noted EF to be 20 to 25% with grade 1 diastolic dysfunction Appreciate cardiology consultation.   Elevated troponin CAD Acute.  High-sensitivity troponins 39->44. No chest pain. Cardiology consult appreciated.  Continue current medication without any changes.  Thrombocytopenia Platelet count low and trending down. Monitor overnight for bleeding. Patient has normal platelet count at his baseline with intermittent worsening when he is hospitalized likely in the event of stress.   History of congenital AV stenosis s/p valvotomy, [place] 2001 with tissue valve, and redo in 2015 Stable per last echocardiogram in 06/2023.   History of VT S/p ICD Continue mexiletine and Ranexa   QTc is prolonged.  Monitor on telemetry.    History of thyroid  cancer Postsurgical hypothyroidism - Continue levothyroxine     Depression and anxiety - Continue Zoloft , Abilify , and clonazepam    GERD - Continue pharmacy substitution Protonix 

## 2024-04-03 NOTE — Op Note (Signed)
 Inguinal Hernia Repair Procedure Note   Pre-operative Diagnosis:  Right Inguinal Hernia.  Post-operative Diagnosis:  Right Inguinal hernia with sigmoid colon; direct  Procedure (s) Performed:  Open right inginal hernia repair with mesh (Lichtenstein procedure).   Surgeon:  Orie Silversmith, MD.  Assistant (s):  Dann Hummer, MD  Anesthesia:  General  Specimens: Cord lipoma         Drains: None.   Cultures:  None.  Estimated Blood Loss:  10cc               Complications:  None; patient tolerated the procedure well.  Indications:  Bradley [name]. has had symptoms and physical exam findings consistent with inguinal hernia with obstruction. Initially was admitted earlier this week but hernia was reduced and felt given recent cardiac procedure would be better to have done electively. Unfortunately patient had recurrence of obstructive symptoms less than 24h after discharge and even though hernia again reduce, we had discussion that better to have repaired this admission than delay.  Operative Findings:  There was a direct right inguinal hernia with partially reducible, partially incarcerated contents.  Procedure Details:  The patient was positively identified and consent was verified in the holding area. The operative site was verified with the patient and marked appropriately in the holding area. The patient was seen again in the Holding Room. The risks, benefits, complications, treatment options, and expected outcomes were discussed with the patient. The possibilities of reaction to medication, pulmonary aspiration, perforation of viscus, bleeding, recurrent infection, the need for additional procedures, and development of a complication requiring transfusion or further operation were discussed with the patient and/or family. The likelihood of success in repairing the hernia and returning the patient to their previous functional status is good.  There was concurrence with the proposed  plan, and informed consent was obtained. The site of surgery was properly noted/marked. The patient was taken to the Operating Room, identified as Bradley [name]., and the procedure verified as right inguinal hernia repair. A Time Out was held and the above information confirmed.  The patient was placed in the supine position and underwent induction of anesthesia. The lower abdomen and groin including scrotum and penis was prepped with betadine  and draped in the standard fashion, and 0.25% Marcaine  with epinephrine  was used to anesthetize the skin over the mid-portion of the inguinal canal. An oblique incision was made. Dissection was carried down through the subcutaneous tissue with cautery to the external oblique fascia.  We opened the external oblique fascia along the direction of its fibers to the external ring.   At this point it was difficult to identify spermatic structures given size of structure and chronic scarring from long term hernia, so I called in my senior partner, Dr. Dann to help assist with retraction and evaluation of hernia and structures and reduction of hernia.. We were then able to completely reduce hernia from scrotum. The spermatic cord was circumferentially identified and dissected bluntly and retracted with a Penrose drain.  The ilioinguinal nerve was identified and ligated as it appeared stretched already. The floor of the inguinal canal was inspected and there was complete obliteration of the floor.  We skeletonized the spermatic cord and once the hernia sac completely free from this, we opened the hernia sac. There was multiple loops of small bowel within the sac. We reduced this back into the abdominal cavity. We then reapproximated the inguinal floor using interrupted 2-0 vicryl  We used a  Progrip  mesh which was inserted and deployed across the floor of the inguinal canal. The mesh was tucked underneath the external oblique fascia laterally.  The flap of the mesh was closed  around the spermatic cord to recreate the internal inguinal ring.  We secured this recreated ring with additional 2-0 vicryl interrupted stitch so that the spermatic cord passed without tension but was not loose enough for herniation of contents.The mesh was secured to the pubic tubercle with 0 Vicryl and secured to the transversalis superiorly with interrupted 2-0 vicryl. The external oblique fascia was reapproximated with 2-0 Vicryl.  3-0 Vicryl was used to close the subcutaneous tissues and 4-0 Monocryl was used to close the skin in subcuticular fashion.  Dermabond used on the skin.  The patient was then extubated and brought to the recovery room in stable condition.  All sponge, instrument, and needle counts were correct prior to closure and at the conclusion of the case.  Condition: stable  Disposition: PACU - hemodynamically stable.   Richerd Silversmith, MD  04/03/2024 10:32 AM

## 2024-04-03 NOTE — Anesthesia Postprocedure Evaluation (Signed)
 Anesthesia Post Note  Patient: Bradley [name].  Procedure(s) Performed: REPAIR, HERNIA, INGUINAL, ADULT (Right: Inguinal) INSERTION OF MESH (Right: Inguinal)     Patient location during evaluation: PACU Anesthesia Type: General Level of consciousness: awake and alert Pain management: pain level controlled Vital Signs Assessment: post-procedure vital signs reviewed and stable Respiratory status: spontaneous breathing, nonlabored ventilation and respiratory function stable Cardiovascular status: stable and blood pressure returned to baseline Anesthetic complications: no   No notable events documented.  Last Vitals:  Vitals:   04/03/24 1046 04/03/24 1108  BP: 131/83 133/87  Pulse: 86 85  Resp: 16 17  Temp: 36.9 C 36.6 C  SpO2: 95% 93%    Last Pain:  Vitals:   04/03/24 1108  TempSrc: Oral  PainSc:                  Debby FORBES Like

## 2024-04-04 ENCOUNTER — Encounter (HOSPITAL_COMMUNITY): Payer: Self-pay | Admitting: General Surgery

## 2024-04-04 DIAGNOSIS — K4030 Unilateral inguinal hernia, with obstruction, without gangrene, not specified as recurrent: Secondary | ICD-10-CM | POA: Diagnosis not present

## 2024-04-04 LAB — COMPREHENSIVE METABOLIC PANEL WITH GFR
ALT: 12 U/L (ref 0–44)
AST: 15 U/L (ref 15–41)
Albumin: 2.7 g/dL — ABNORMAL LOW (ref 3.5–5.0)
Alkaline Phosphatase: 31 U/L — ABNORMAL LOW (ref 38–126)
Anion gap: 9 (ref 5–15)
BUN: 21 mg/dL (ref 8–23)
CO2: 22 mmol/L (ref 22–32)
Calcium: 8.7 mg/dL — ABNORMAL LOW (ref 8.9–10.3)
Chloride: 110 mmol/L (ref 98–111)
Creatinine, Ser: 1.16 mg/dL (ref 0.61–1.24)
GFR, Estimated: >60 mL/min (ref >60)
Glucose, Bld: 152 mg/dL — ABNORMAL HIGH (ref 70–99)
Potassium: 4.1 mmol/L (ref 3.5–5.1)
Sodium: 141 mmol/L (ref 135–145)
Total Bilirubin: 0.6 mg/dL (ref 0.0–1.2)
Total Protein: 5.4 g/dL — ABNORMAL LOW (ref 6.5–8.1)

## 2024-04-04 LAB — CBC WITH DIFFERENTIAL/PLATELET
Abs Immature Granulocytes: 0.05 K/uL (ref 0.00–0.07)
Basophils Absolute: 0 K/uL (ref 0.0–0.1)
Basophils Relative: 0 %
Eosinophils Absolute: 0 K/uL (ref 0.0–0.5)
Eosinophils Relative: 0 %
HCT: 41.9 % (ref 39.0–52.0)
Hemoglobin: 14.2 g/dL (ref 13.0–17.0)
Immature Granulocytes: 1 %
Lymphocytes Relative: 4 %
Lymphs Abs: 0.4 K/uL — ABNORMAL LOW (ref 0.7–4.0)
MCH: 31.2 pg (ref 26.0–34.0)
MCHC: 33.9 g/dL (ref 30.0–36.0)
MCV: 92.1 fL (ref 80.0–100.0)
Monocytes Absolute: 1.5 K/uL — ABNORMAL HIGH (ref 0.1–1.0)
Monocytes Relative: 16 %
Neutro Abs: 7.3 K/uL (ref 1.7–7.7)
Neutrophils Relative %: 79 %
Platelets: 112 K/uL — ABNORMAL LOW (ref 150–400)
RBC: 4.55 MIL/uL (ref 4.22–5.81)
RDW: 13.7 % (ref 11.5–15.5)
WBC: 9.2 K/uL (ref 4.0–10.5)
nRBC: 0 % (ref 0.0–0.2)

## 2024-04-04 LAB — MAGNESIUM: Magnesium: 1.9 mg/dL (ref 1.7–2.4)

## 2024-04-04 MED ORDER — ACETAMINOPHEN 500 MG PO TABS: 1000.0000 mg | ORAL_TABLET | Freq: Four times a day (QID) | ORAL | Status: AC | PRN

## 2024-04-04 NOTE — Discharge Summary (Signed)
 Physician Discharge Summary   Patient: Bradley [name]. MRN: [id number] DOB: 1956/06/22  Admit date:     04/02/2024  Discharge date: 04/04/24  Discharge Physician: Yetta Blanch  PCP: Micheal Wolm ORN, MD  Recommendations at discharge: Follow-up with PCP in 1 week. Follow-up with general surgery as recommended. BMP and CBC in 1 week. Follow-up with cardiology as recommended.   Follow-up Information     Ann Fine, MD Follow up in 2 week(s).   Specialty: General Surgery Why: Office will call you with a follow up appointment, If you don't hear from the office, please call, Arrive 30 minutes prior to your appointment time, Please bring your insurance card and photo ID Contact information: [address] [place] [address] [phone]         Micheal Wolm ORN, MD. Schedule an appointment as soon as possible for a visit in 1 week(s).   Specialty: Family Medicine Why: with BMP lab to look at kidney/electrolyte numbers, with CBC lab to look at blood counts Contact information: [address] [place] [place] [address] [phone]               Discharge Diagnoses: Principal Problem:   Inguinal hernia of right side with obstruction Active Problems:   Heart failure with reduced ejection fraction (HCC)   Elevated troponin- chronic- normal coronaries   CAD (coronary artery disease)   H/O aortic valve repair- 1968   History of ventricular tachycardia   AICD (automatic cardioverter/defibrillator) present   Thrombocytopenia (HCC)   Hypothyroidism, postsurgical   Anxiety and depression  Hospital Course:  Bradley Steck. is a 68 y.o. male with medical history significant of HFrEF / NICM with VT s/p ICD placement 02/11/2024, congenital AS s/p balloon valvotomy at age 74 / AV repair 1968, CAD HTN, thyroid  cancer s/p thyroidectomy with residual hypothyroidism, and MDD w/ psychotic presents with vomiting, diarrhea, and abdominal discomfort.   Found to have bowel obstruction due to inguinal hernia.  Underwent open right inguinal hernia repair for obstruction.  Assessment and Plan: Small bowel obstruction secondary to inguinal hernia Recurrent. General Surgery was consulted. Open right inguinal hernia repair. Able to tolerate oral diet.  Pain well-controlled.  No nausea no vomiting.   Chronic heart failure with reduced EF Patient appears to be euvolemic on physical exam.  Last available echocardiogram from 06/2023 noted EF to be 20 to 25% with grade 1 diastolic dysfunction Appreciate cardiology consultation.   Elevated troponin CAD Acute.  High-sensitivity troponins 39->44. No chest pain. Cardiology consult appreciated.  Continue current medication without any changes.  Thrombocytopenia Platelet count low and trending down.  Now going up.  No bleeding. Patient has normal platelet count at his baseline with intermittent worsening when he is hospitalized likely in the event of stress.   History of congenital AV stenosis s/p valvotomy, [place] 2001 with tissue valve, and redo in 2015 Stable per last echocardiogram in 06/2023.   History of VT S/p ICD Continue mexiletine and Ranexa   QTc is prolonged.  Monitor on telemetry.    History of thyroid  cancer Postsurgical hypothyroidism - Continue levothyroxine     Depression and anxiety - Continue Zoloft , Abilify , and clonazepam    GERD - Continue pharmacy substitution Protonix   Pain control - [place]  Controlled Substance Reporting System database was reviewed. and patient was instructed, not to drive, operate heavy machinery, perform activities at heights, swimming or participation in water  activities or provide baby-sitting services while on Pain, Sleep and Anxiety Medications;  until their outpatient Physician has advised to do so again. Also recommended to not to take more than prescribed Pain, Sleep and Anxiety Medications.  Consultants:  General Surgery  cardiology  Procedures performed:  Open right inguinal hernia repair  DISCHARGE MEDICATION: Allergies as of 04/04/2024       Reactions   Oxycodone  Other (See Comments)   Gives patient nightmares   Iodinated Contrast Media Rash   Rifampin  Nausea Only        Medication List     TAKE these medications    acetaminophen  500 MG tablet Commonly known as: TYLENOL  Take 2 tablets (1,000 mg total) by mouth every 6 (six) hours as needed.   Airborne Chew Chew 1 tablet by mouth at bedtime.   amoxicillin 500 MG capsule Commonly known as: AMOXIL SMARTSIG:4 Capsule(s) By Mouth   ARIPiprazole  5 MG tablet Commonly known as: Abilify  Take 1 tablet nightly What changed:  how much to take how to take this when to take this additional instructions   aspirin  EC 81 MG tablet Take 81 mg by mouth at bedtime.   atorvastatin  40 MG tablet Commonly known as: LIPITOR TAKE 1 TABLET BY MOUTH EVERY DAY What changed: when to take this   carvedilol  6.25 MG tablet Commonly known as: COREG  TAKE 1 TABLET BY MOUTH TWICE A DAY WITH FOOD   clonazePAM  1 MG tablet Commonly known as: KLONOPIN  Take 0.5 mg by mouth daily as needed for anxiety.   docusate sodium  100 MG capsule Commonly known as: COLACE Take 100 mg by mouth 3 (three) times a week.   doxazosin  1 MG tablet Commonly known as: CARDURA  TAKE 1 TABLET (1 MG TOTAL) BY MOUTH DAILY. FOR SBP GREATER THAN 140 What changed:  when to take this reasons to take this additional instructions   Entresto  97-103 MG Generic drug: sacubitril -valsartan  TAKE 1 TABLET BY MOUTH TWICE A DAY   Farxiga  10 MG Tabs tablet Generic drug: dapagliflozin  propanediol TAKE 1 TABLET BY MOUTH EVERY DAY   furosemide  20 MG tablet Commonly known as: LASIX  TAKE 1 TABLET BY MOUTH 4 TIMES A WEEK. What changed: See the new instructions.   levothyroxine  150 MCG tablet Commonly known as: SYNTHROID  TAKE 1 TABLET BY MOUTH EVERY DAY   mexiletine 150 MG  capsule Commonly known as: MEXITIL  TAKE 2 CAPSULES BY MOUTH EVERY MORNING AND 2 CAPSULES BY MOUTH EVERY EVENING.   omeprazole  20 MG capsule Commonly known as: PRILOSEC Take 20 mg by mouth daily as needed (Stomach).   ranolazine  500 MG 12 hr tablet Commonly known as: RANEXA  TAKE 1 TABLET BY MOUTH TWICE A DAY   sertraline  50 MG tablet Commonly known as: ZOLOFT  TAKE 1 TABLET BY MOUTH EVERY DAY   traMADol  50 MG tablet Commonly known as: Ultram  Take 1-2 tablets (50-100 mg total) by mouth every 6 (six) hours as needed for up to 7 days for severe pain (pain score 7-10).       Disposition: Home Diet recommendation: Carb modified diet  Discharge Exam: Vitals:   04/03/24 1544 04/03/24 2026 04/04/24 0523 04/04/24 0816  BP: 130/85 132/84 (!) 131/95 (!) 133/92  Pulse: 90 93 79 76  Resp: 16 18 16 16   Temp: 97.7 F (36.5 C) 98 F (36.7 C) 98 F (36.7 C) 98 F (36.7 C)  TempSrc: Oral Oral Oral Oral  SpO2: 94% 95% 96% 95%  Weight:      Height:       Clear to auscultation. S1-S2 present Bowel sound present. No  edema  Filed Weights   04/02/24 0036 04/03/24 0642  Weight: 90.3 kg 90 kg   Condition at discharge: stable  The results of significant diagnostics from this hospitalization (including imaging, microbiology, ancillary and laboratory) are listed below for reference.   Imaging Studies: CT ABDOMEN PELVIS WO CONTRAST Result Date: 04/02/2024 CLINICAL DATA:  Acute nonlocalized abdominal pain EXAM: CT ABDOMEN AND PELVIS WITHOUT CONTRAST TECHNIQUE: Multidetector CT imaging of the abdomen and pelvis was performed following the standard protocol without IV contrast. RADIATION DOSE REDUCTION: This exam was performed according to the departmental dose-optimization program which includes automated exposure control, adjustment of the mA and/or kV according to patient size and/or use of iterative reconstruction technique. COMPARISON:  03/31/2024 FINDINGS: Lower chest: No acute  abnormality. Hepatobiliary: No focal liver abnormality is seen. No gallstones, gallbladder wall thickening, or biliary dilatation. Pancreas: Unremarkable Spleen: Unremarkable Adrenals/Urinary Tract: Adrenal glands are unremarkable. Simple cortical cysts are seen within the kidneys bilaterally for which no follow-up imaging is recommended. The kidneys are otherwise unremarkable. Bladder unremarkable. Stomach/Bowel: Dilated gas and fluid-filled loops of small bowel are again identified involving the proximal in mid small bowel with loops of bowel measuring up to 4.5 cm in diameter. Similar prior examination, there is a notable caliber change involving the small bowel as it passes into the large right hernia sac in keeping with the point of transition for the small-bowel obstruction. Similar prior examination, the hernia sac contains multiple loops of unremarkable small bowel as well as a small amount of free fluid. Mild descending colonic diverticulosis. The stomach, large, and small bowel are otherwise unremarkable. The appendix is not visualized and may be absent. No free intraperitoneal gas. Vascular/Lymphatic: Aortic atherosclerosis. No enlarged abdominal or pelvic lymph nodes. Reproductive: Mild prostatic hypertrophy Other: Large right and small left inguinal hernias are present. Musculoskeletal: No acute bone abnormality. No lytic or blastic bone lesion. Osseous structures are age appropriate. IMPRESSION: 1. Persistent small bowel obstruction with point of transition involving the small bowel as it passes into the large right inguinal hernia sac. 2. Mild descending colonic diverticulosis. 3. Large right and small left inguinal hernias. 4. Mild prostatic hypertrophy. 5. Aortic atherosclerosis. Aortic Atherosclerosis (ICD10-I70.0). Electronically Signed   By: Dorethia Molt M.D.   On: 04/02/2024 03:56   DG Abd Portable 1 View Result Date: 03/31/2024 EXAM: 1 VIEW XRAY OF THE ABDOMEN 03/31/2024 02:29:00 PM  COMPARISON: None available. CLINICAL HISTORY: Post-NG. Reason for exam: Post-NG; Per triage notes: Pt BIB EMS from home for N/V starting this morning and complaints of being cold. Initial oral temp with EMS 94.2 but repeat was 97. Pt has a defibrillator, battery replaced 2 months ago. Hx of previous heart valve infection, redone in 2015. AOX4. FINDINGS: BOWEL: Partially visualized distended small bowel loops in the upper abdomen. SOFT TISSUES: No opaque urinary calculi. Left chest wall single lead cardiac defibrillator noted. BONES: No acute osseous abnormality. LINES AND TUBES: Enteric tube in place with distal tip and side port in the proximal stomach. IMPRESSION: 1. Enteric tube in place with distal tip and side port in the proximal stomach. 2. Partially visualized distended small bowel loops in the upper abdomen, compatible with distal small bowel obstruction seen on CT from earlier today. Electronically signed by: Selinda Blue MD 03/31/2024 03:00 PM EDT RP Workstation: [id number]   CT ABDOMEN PELVIS WO CONTRAST Result Date: 03/31/2024 EXAM: CT ABDOMEN AND PELVIS WITHOUT CONTRAST 03/31/2024 12:59:00 PM TECHNIQUE: CT of the abdomen and pelvis was performed without the administration  of intravenous contrast. Multiplanar reformatted images are provided for review. Automated exposure control, iterative reconstruction, and/or weight-based adjustment of the mA/kV was utilized to reduce the radiation dose to as low as reasonably achievable. COMPARISON: 03/16/23 CT abdomen/pelvis CLINICAL HISTORY: Abd pain, vomiting. No contrast; Pt BIB EMS from home for N/V starting this morning and complaints of being cold. Initial oral temp with EMS 94.2 but repeat was 97. Pt has a defibrillator, battery replaced 2 months ago. Hx of previous heart valve infection, redone in 2015. AOX4. FINDINGS: LOWER CHEST: Intact lower sternotomy wires. ICD lead indeterminant in the right ventricular apex. Very middle lobe 4 mm pulmonary nodule on  series 5/image 6 is unchanged from 10/23/13 chest CT and considered benign. No acute abnormality of the lung bases. Fluid in the lower thoracic esophagus. LIVER: Chronic small coarse dystrophic calcification in the posterior right liver. Otherwise normal liver with no liver masses. GALLBLADDER AND BILE DUCTS: No wall thickening. No cholelithiasis. No biliary ductal dilatation. SPLEEN: Normal size. No focal lesion. PANCREAS: No mass. No ductal dilatation. ADRENAL GLANDS: Stable 1.3 cm left adrenal nodule with density negative for HU compatible with an adenoma. KIDNEYS, URETERS AND BLADDER: Scattered small simple bilateral renal cysts, largest 2.5 cm in the lateral upper right kidney, for which no follow up imaging is recommended. No stones in the kidneys or ureters. No hydronephrosis. No perinephric or periureteral stranding. Normal nondistended bladder. GI AND BOWEL: Large right inguinal hernia containing multiple small bowel loops. Diffuse dilatation of the small bowel up to 4 cm diameter proximal to the right inguinal hernia, with diffuse small bowel air-fluid levels compatible with small bowel obstruction. Small amount of free fluid in the right inguinal hernia sac. Appendix not discretely visualized. Left colonic diverticulosis with no large bowel wall thickening or significant pericolonic fat stranding. No definite pneumatosis or bowel wall thickening. PERITONEUM AND RETROPERITONEUM: Trace right-sided ascites. No focal fluid collection. VASCULATURE: Atherosclerotic nonaneurysmal abdominal aorta. LYMPH NODES: No lymphadenopathy. REPRODUCTIVE ORGANS: Chronic mild prostatomegaly. BONES AND SOFT TISSUES: Moderate thoracolumbar spondylosis. No acute osseous abnormality. No focal soft tissue abnormality. IMPRESSION: 1. High-grade distal small bowel obstruction at the level of the large right inguinal hernia containing multiple distal small bowel loops. 2. Trace right-sided ascites and small volume fluid within the  right inguinal hernia sac. No free air or definite pneumatosis. 3. Mild left colonic diverticulosis. 4. Stable small left adrenal adenoma, for which no follow-up imaging is recommended. Electronically signed by: Selinda Blue MD 03/31/2024 01:38 PM EDT RP Workstation: [id number]   CUP PACEART REMOTE DEVICE CHECK Result Date: 03/26/2024 ICD scheduled remote reviewed. Normal device function.  Presenting rhythm: VS Next remote 91 days. AB, CVRS   Microbiology: Results for orders placed or performed during the hospital encounter of 04/02/24  Surgical pcr screen     Status: None   Collection Time: 04/03/24  5:26 AM   Specimen: Nasal Mucosa; Nasal Swab  Result Value Ref Range Status   MRSA, PCR NEGATIVE NEGATIVE Final   Staphylococcus aureus NEGATIVE NEGATIVE Final    Comment: (NOTE) The Xpert SA Assay (FDA approved for NASAL specimens in patients 39 years of age and older), is one component of a comprehensive surveillance program. It is not intended to diagnose infection nor to guide or monitor treatment. Performed at [facility], [address], [place], [place] [address]    *Note: Due to a large number of results and/or encounters for the requested time period, some results have not been displayed. A  complete set of results can be found in Results Review.   Labs: CBC: Recent Labs  Lab 03/31/24 1051 04/02/24 0043 04/03/24 0305 04/04/24 0338  WBC 18.7* 5.5 5.1 9.2  NEUTROABS  --   --   --  7.3  HGB 18.7* 15.2 13.9 14.2  HCT 57.1* 46.2 40.8 41.9  MCV 94.9 95.7 93.4 92.1  PLT 162 119* 97* 112*   Basic Metabolic Panel: Recent Labs  Lab 03/31/24 1640 04/02/24 0043 04/03/24 0305 04/04/24 0338  NA 144 140 140 141  K 4.1 3.8 3.5 4.1  CL 109 107 108 110  CO2 19* 21* 22 22  GLUCOSE 140* 120* 104* 152*  BUN 20 23 21 21   CREATININE 1.24 1.19 1.15 1.16  CALCIUM  9.5 9.0 8.4* 8.7*  MG  --   --   --  1.9   Liver Function Tests: Recent Labs  Lab 03/31/24 1640 04/02/24 0043  04/04/24 0338  AST 22 22 15   ALT 19 19 12   ALKPHOS 47 35* 31*  BILITOT 1.2 0.9 0.6  PROT 6.7 6.2* 5.4*  ALBUMIN  3.8 3.5 2.7*   CBG: Recent Labs  Lab 04/03/24 2307  GLUCAP 163*    Discharge time spent: greater than 30 minutes.  Author: Yetta Blanch, MD  [facility]

## 2024-04-04 NOTE — Discharge Instructions (Signed)
 UMBILICAL OR INGUINAL HERNIA REPAIR: POST OP INSTRUCTIONS  Always review your discharge instruction sheet given to you by the facility where your surgery was performed. IF YOU HAVE DISABILITY OR FAMILY LEAVE FORMS, YOU MUST BRING THEM TO THE OFFICE FOR PROCESSING.   DO NOT GIVE THEM TO YOUR DOCTOR.  A  prescription for pain medication may be given to you upon discharge.  Take your pain medication as prescribed, if needed.  If narcotic pain medicine is not needed, then you may take acetaminophen (Tylenol) or ibuprofen (Advil) as needed. Take your usually prescribed medications unless otherwise directed. If you need a refill on your pain medication, please contact your pharmacy.  They will contact our office to request authorization. Prescriptions will not be filled after 5 pm or on week-ends. You should follow a light diet the first 24 hours after arrival home, such as soup and crackers, etc.  Be sure to include lots of fluids daily.  Resume your normal diet the day after surgery. Most patients will experience some swelling and bruising around the umbilicus or in the groin and scrotum.  Ice packs and reclining will help.  Swelling and bruising can take several days to resolve.  It is common to experience some constipation if taking pain medication after surgery.  Increasing fluid intake and taking a stool softener (such as Colace) will usually help or prevent this problem from occurring.  A mild laxative (Milk of Magnesia or Miralax) should be taken according to package directions if there are no bowel movements after 48 hours. Unless discharge instructions indicate otherwise, you may remove your bandages 24-48 hours after surgery, and you may shower at that time.  You may have steri-strips (small skin tapes) in place directly over the incision.  These strips should be left on the skin for 7-10 days.  If your surgeon used skin glue on the incision, you may shower in 24 hours.  The glue will flake off  over the next 2-3 weeks.  Any sutures or staples will be removed at the office during your follow-up visit. ACTIVITIES:  You may resume regular (light) daily activities beginning the next day--such as daily self-care, walking, climbing stairs--gradually increasing activities as tolerated.  You may have sexual intercourse when it is comfortable.  Refrain from any heavy lifting or straining until approved by your doctor. You may drive when you are no longer taking prescription pain medication, you can comfortably wear a seatbelt, and you can safely maneuver your car and apply brakes. You should see your doctor in the office for a follow-up appointment approximately 2-3 weeks after your surgery.  Make sure that you call for this appointment within a day or two after you arrive home to insure a convenient appointment time.   WHEN TO CALL YOUR DOCTOR: Fever over 101.0 Inability to urinate Nausea and/or vomiting Extreme swelling or bruising Continued bleeding from incision. Increased pain, redness, or drainage from the incision  The clinic staff is available to answer your questions during regular business hours.  Please don't hesitate to call and ask to speak to one of the nurses for clinical concerns.  If you have a medical emergency, go to the nearest emergency room or call 911.  A surgeon from [facility] is always on call at the hospital   [address], [place], [place]  [address] ?  [address] [address], [place], [place]   [address] [phone] ? [phone] ? FAX [phone] Web site: [email or web address]

## 2024-04-04 NOTE — Plan of Care (Signed)

## 2024-04-04 NOTE — Progress Notes (Signed)
 DAILY PROGRESS NOTE   Patient Name: Bradley [name]. Date of Encounter: 04/04/2024 Cardiologist: Vina Gull, MD  Chief Complaint   Minimal incisional discomfort  Patient Profile   Bradley Plain. is a 68 y.o. male with a hx of thyroid  cancer s/p thyroidectomy, hypertension, depression, SBE, NICM s/p Abbott ICD and congenital AS s/p Bentall procedure who is being seen 04/02/2024 for preop clearance prior to inguinal hernia repair at the request of Dr. Lyndel.   Subjective   Doing well POD #1. Seen for cardiac follow-up. Felt ok to d/c home today per general surgery.   Objective   Vitals:   04/03/24 1544 04/03/24 2026 04/04/24 0523 04/04/24 0816  BP: 130/85 132/84 (!) 131/95 (!) 133/92  Pulse: 90 93 79 76  Resp: 16 18 16 16   Temp: 97.7 F (36.5 C) 98 F (36.7 C) 98 F (36.7 C) 98 F (36.7 C)  TempSrc: Oral Oral Oral Oral  SpO2: 94% 95% 96% 95%  Weight:      Height:        Intake/Output Summary (Last 24 hours) at 04/04/2024 0927 Last data filed at 04/04/2024 0800 Gross per 24 hour  Intake 640 ml  Output --  Net 640 ml   Filed Weights   04/02/24 0036 04/03/24 0642  Weight: 90.3 kg 90 kg    Physical Exam   General appearance: alert and no distress Lungs: clear to auscultation bilaterally Heart: regular rate and rhythm, S1, S2 normal, no murmur, click, rub or gallop Extremities: extremities normal, atraumatic, no cyanosis or edema Neurologic: Grossly normal  Inpatient Medications    Scheduled Meds:  acetaminophen   1,000 mg Oral Q6H   Or   acetaminophen   650 mg Rectal Q6H   ARIPiprazole   5 mg Oral QHS   aspirin  EC  81 mg Oral QHS   atorvastatin   40 mg Oral QHS   carvedilol   3.125 mg Oral BID WC   dapagliflozin  propanediol  10 mg Oral Daily   doxazosin   1 mg Oral Daily   furosemide   20 mg Oral Once per day on Monday Wednesday Friday   levothyroxine   150 mcg Oral Daily   mexiletine  300 mg Oral Q12H   pantoprazole   40 mg Oral Daily    ranolazine   500 mg Oral BID   sacubitril -valsartan   1 tablet Oral BID   sertraline   50 mg Oral Daily   sodium chloride  flush  3 mL Intravenous Q12H    Continuous Infusions:   PRN Meds: albuterol , morphine  injection, traMADol , trimethobenzamide    Labs   Results for orders placed or performed during the hospital encounter of 04/02/24 (from the past 48 hours)  CBC     Status: Abnormal   Collection Time: 04/03/24  3:05 AM  Result Value Ref Range   WBC 5.1 4.0 - 10.5 K/uL   RBC 4.37 4.22 - 5.81 MIL/uL   Hemoglobin 13.9 13.0 - 17.0 g/dL   HCT 59.1 60.9 - 47.9 %   MCV 93.4 80.0 - 100.0 fL   MCH 31.8 26.0 - 34.0 pg   MCHC 34.1 30.0 - 36.0 g/dL   RDW 86.4 88.4 - 84.4 %   Platelets 97 (L) 150 - 400 K/uL    Comment: REPEATED TO VERIFY Immature Platelet Fraction may be clinically indicated, consider ordering this additional test [id number]    nRBC 0.0 0.0 - 0.2 %    Comment: Performed at [facility], [address] [place], [place] [address]  Basic metabolic panel     Status: Abnormal   Collection Time: 04/03/24  3:05 AM  Result Value Ref Range   Sodium 140 135 - 145 mmol/L   Potassium 3.5 3.5 - 5.1 mmol/L   Chloride 108 98 - 111 mmol/L   CO2 22 22 - 32 mmol/L   Glucose, Bld 104 (H) 70 - 99 mg/dL    Comment: Glucose reference range applies only to samples taken after fasting for at least 8 hours.   BUN 21 8 - 23 mg/dL   Creatinine, Ser 8.84 0.61 - 1.24 mg/dL   Calcium  8.4 (L) 8.9 - 10.3 mg/dL   GFR, Estimated >39 >39 mL/min    Comment: (NOTE) Calculated using the CKD-EPI Creatinine Equation (2021)    Anion gap 10 5 - 15    Comment: Performed at [facility], [address] [place], [place] [address]  Surgical pcr screen     Status: None   Collection Time: 04/03/24  5:26 AM   Specimen: Nasal Mucosa; Nasal Swab  Result Value Ref Range   MRSA, PCR NEGATIVE NEGATIVE   Staphylococcus aureus NEGATIVE NEGATIVE    Comment: (NOTE) The Xpert SA Assay (FDA approved  for NASAL specimens in patients 47 years of age and older), is one component of a comprehensive surveillance program. It is not intended to diagnose infection nor to guide or monitor treatment. Performed at [facility], [address], [place], [place] [address]   Glucose, capillary     Status: Abnormal   Collection Time: 04/03/24 11:07 PM  Result Value Ref Range   Glucose-Capillary 163 (H) 70 - 99 mg/dL    Comment: Glucose reference range applies only to samples taken after fasting for at least 8 hours.  CBC with Differential/Platelet     Status: Abnormal   Collection Time: 04/04/24  3:38 AM  Result Value Ref Range   WBC 9.2 4.0 - 10.5 K/uL   RBC 4.55 4.22 - 5.81 MIL/uL   Hemoglobin 14.2 13.0 - 17.0 g/dL   HCT 58.0 60.9 - 47.9 %   MCV 92.1 80.0 - 100.0 fL   MCH 31.2 26.0 - 34.0 pg   MCHC 33.9 30.0 - 36.0 g/dL   RDW 86.2 88.4 - 84.4 %   Platelets 112 (L) 150 - 400 K/uL   nRBC 0.0 0.0 - 0.2 %   Neutrophils Relative % 79 %   Neutro Abs 7.3 1.7 - 7.7 K/uL   Lymphocytes Relative 4 %   Lymphs Abs 0.4 (L) 0.7 - 4.0 K/uL   Monocytes Relative 16 %   Monocytes Absolute 1.5 (H) 0.1 - 1.0 K/uL   Eosinophils Relative 0 %   Eosinophils Absolute 0.0 0.0 - 0.5 K/uL   Basophils Relative 0 %   Basophils Absolute 0.0 0.0 - 0.1 K/uL   Immature Granulocytes 1 %   Abs Immature Granulocytes 0.05 0.00 - 0.07 K/uL    Comment: Performed at [facility], [address], [place], [place] [address]  Comprehensive metabolic panel with GFR     Status: Abnormal   Collection Time: 04/04/24  3:38 AM  Result Value Ref Range   Sodium 141 135 - 145 mmol/L   Potassium 4.1 3.5 - 5.1 mmol/L   Chloride 110 98 - 111 mmol/L   CO2 22 22 - 32 mmol/L   Glucose, Bld 152 (H) 70 - 99 mg/dL    Comment: Glucose reference range applies only to samples taken after fasting for  at least 8 hours.   BUN 21 8 - 23 mg/dL   Creatinine, Ser 8.83 0.61 - 1.24 mg/dL   Calcium  8.7 (L) 8.9 - 10.3 mg/dL   Total Protein  5.4 (L) 6.5 - 8.1 g/dL   Albumin  2.7 (L) 3.5 - 5.0 g/dL   AST 15 15 - 41 U/L   ALT 12 0 - 44 U/L   Alkaline Phosphatase 31 (L) 38 - 126 U/L   Total Bilirubin 0.6 0.0 - 1.2 mg/dL   GFR, Estimated >39 >39 mL/min    Comment: (NOTE) Calculated using the CKD-EPI Creatinine Equation (2021)    Anion gap 9 5 - 15    Comment: Performed at [facility], [address] [place], [place] [address]  Magnesium      Status: None   Collection Time: 04/04/24  3:38 AM  Result Value Ref Range   Magnesium  1.9 1.7 - 2.4 mg/dL    Comment: Performed at [facility], [address] [place], [place] [address]   *Note: Due to a large number of results and/or encounters for the requested time period, some results have not been displayed. A complete set of results can be found in Results Review.    ECG   N/A  Telemetry   Sinus rhythm with IVCD/LBBB, prolonged QTc - Personally Reviewed  Radiology    No results found.  Cardiac Studies   N/A  Assessment   Principal Problem:   Inguinal hernia of right side with obstruction Active Problems:   Hypothyroidism, postsurgical   Elevated troponin- chronic- normal coronaries   H/O aortic valve repair- 1968   History of ventricular tachycardia   AICD (automatic cardioverter/defibrillator) present   Heart failure with reduced ejection fraction (HCC)   CAD (coronary artery disease)   Thrombocytopenia (HCC)   Anxiety and depression   Plan   Doing well POD #1 - plans for d/c home later today. Home HF meds have been restarted. Appears euvolemic today.  Has follow-up with EP in September - will reach out for HF clinic follow-up appt as well.  [facility] will sign off.   Medication Recommendations:  restart/continue home meds Other recommendations (labs, testing, etc):  none Follow up as an outpatient:  Dr. Bensimhon and Dr. Inocencio   Time Spent Directly with Patient:  I have spent a total of 25 minutes with the patient  reviewing hospital notes, telemetry, EKGs, labs and examining the patient as well as establishing an assessment and plan that was discussed personally with the patient.  > 50% of time was spent in direct patient care.  Length of Stay:  LOS: 2 days   Bradley KYM [place], MD, [facility], FNLA, FACP  [place]  [facility]  Medical Director of the [facility] Lipid Disorders &  Cardiovascular Risk Reduction Clinic Diplomate of the American Board of Clinical Lipidology Attending Cardiologist  Direct Dial: [phone]  Fax: [phone]  Website:  [email or web address] Bradley [name] 04/04/2024, 9:27 AM

## 2024-04-04 NOTE — Progress Notes (Signed)
    1 Day Post-Op  Subjective: Doing well today.  Pain controlled.  Mobilizing.  Tolerating solid diet with no issues.  ROS: See above, otherwise other systems negative  Objective: Vital signs in last 24 hours: Temp:  [97.7 F (36.5 C)-99 F (37.2 C)] 98 F (36.7 C) (09/05 0816) Pulse Rate:  [76-93] 76 (09/05 0816) Resp:  [13-18] 16 (09/05 0816) BP: (128-145)/(81-109) 133/92 (09/05 0816) SpO2:  [92 %-96 %] 95 % (09/05 0816) Last BM Date : 04/02/24  Intake/Output from previous day: 09/04 0701 - 09/05 0700 In: 450 [I.V.:400; IV Piggyback:50] Out: 150 [Urine:150] Intake/Output this shift: No intake/output data recorded.  PE: Abd: soft, appropriately tender in RLQ, incision c/d/I with some surrounding ecchymosis as expected.  ND  Lab Results:  Recent Labs    04/03/24 0305 04/04/24 0338  WBC 5.1 9.2  HGB 13.9 14.2  HCT 40.8 41.9  PLT 97* 112*   BMET Recent Labs    04/03/24 0305 04/04/24 0338  NA 140 141  K 3.5 4.1  CL 108 110  CO2 22 22  GLUCOSE 104* 152*  BUN 21 21  CREATININE 1.15 1.16  CALCIUM  8.4* 8.7*   PT/INR No results for input(s): LABPROT, INR in the last 72 hours. CMP     Component Value Date/Time   NA 141 04/04/2024 0338   NA 144 01/29/2024 1237   K 4.1 04/04/2024 0338   CL 110 04/04/2024 0338   CO2 22 04/04/2024 0338   GLUCOSE 152 (H) 04/04/2024 0338   BUN 21 04/04/2024 0338   BUN 22 01/29/2024 1237   CREATININE 1.16 04/04/2024 0338   CREATININE 1.28 10/14/2015 1228   CALCIUM  8.7 (L) 04/04/2024 0338   PROT 5.4 (L) 04/04/2024 0338   ALBUMIN  2.7 (L) 04/04/2024 0338   AST 15 04/04/2024 0338   ALT 12 04/04/2024 0338   ALKPHOS 31 (L) 04/04/2024 0338   BILITOT 0.6 04/04/2024 0338   GFRNONAA >60 04/04/2024 0338   GFRAA 74 04/13/2020 1609   Lipase     Component Value Date/Time   LIPASE 24 04/02/2024 0043       Studies/Results: No results found.  Anti-infectives: Anti-infectives (From admission, onward)    None         Assessment/Plan POD 1, s/p open RIH repair with mesh, Dr. Ann, 9/4 -tolerating solid diet -pain well controlled -mobilizing -voiding -surgically stable to DC home.  D/w primary  FEN - regular VTE - SCDs ID - none   LOS: 2 days    Burnard FORBES Banter , [facility] 04/04/2024, 8:20 AM Please see Amion for pager number during day hours 7:00am-4:30pm or 7:00am -11:30am on weekends

## 2024-04-07 ENCOUNTER — Other Ambulatory Visit

## 2024-04-07 ENCOUNTER — Telehealth: Payer: Self-pay

## 2024-04-07 LAB — SURGICAL PATHOLOGY

## 2024-04-07 NOTE — Transitions of Care (Post Inpatient/ED Visit) (Signed)
 04/07/2024  Name: Bradley [name]. MRN: [id number] DOB: [birth year]  Today's TOC FU Call Status: Today's TOC FU Call Status:: Successful TOC FU Call Completed TOC FU Call Complete Date: 04/07/24 Patient's Name and Date of Birth confirmed.  Transition Care Management Follow-up Telephone Call Date of Discharge: 04/04/24 Discharge Facility: [facility]) Type of Discharge: Inpatient Admission Primary Inpatient Discharge Diagnosis:: Inguinal Hernia of the  right side with obstructions How have you been since you were released from the hospital?: Better (has diarrhea) Any questions or concerns?: No  Items Reviewed: Did you receive and understand the discharge instructions provided?: Yes Medications obtained,verified, and reconciled?: Yes (Medications Reviewed) Any new allergies since your discharge?: No Dietary orders reviewed?: Yes Type of Diet Ordered:: low sodium heart healthy diet. Do you have support at home?: Yes People in Home [RPT]: spouse Name of Support/Comfort Primary Source: Bradley [name]  Medications Reviewed Today: Medications Reviewed Today     Reviewed by Rumalda Alan PENNER, RN (Registered Nurse) on 04/07/24 at [phone]  Med List Status: <None>   Medication Order Taking? Sig Documenting Provider Last Dose Status Informant  acetaminophen  (TYLENOL ) 500 MG tablet [id number] Yes Take 2 tablets (1,000 mg total) by mouth every 6 (six) hours as needed. Tammy Sor, PA-C  Active   amoxicillin (AMOXIL) 500 MG capsule [id number]  SMARTSIG:4 Capsule(s) By Mouth  Patient not taking: Reported on 04/07/2024   [provider]  Active Spouse/Significant Other, Pharmacy Records           Med Note (CRUTHIS, CHLOE C   Tue Apr 01, 2024  7:17 AM)    ARIPiprazole  (ABILIFY ) 5 MG tablet [id number] Yes Take 1 tablet nightly Micheal Wolm ORN, MD  Active Spouse/Significant Other, Pharmacy Records  aspirin  EC 81 MG tablet [id number] Yes Take 81 mg by mouth at bedtime.  [provider]   Active Spouse/Significant Other, Pharmacy Records  atorvastatin  (LIPITOR) 40 MG tablet [id number] Yes TAKE 1 TABLET BY MOUTH EVERY DAY  Patient taking differently: TAKE 1 TABLET BY MOUTH EVERY DAY   Burchette, Wolm ORN, MD  Active Spouse/Significant Other, Pharmacy Records  carvedilol  (COREG ) 6.25 MG tablet [id number] Yes TAKE 1 TABLET BY MOUTH TWICE A DAY WITH FOOD Bensimhon, Toribio SAUNDERS, MD  Active Spouse/Significant Other, Pharmacy Records  clonazePAM  (KLONOPIN ) 1 MG tablet [id number] Yes Take 0.5 mg by mouth daily as needed for anxiety. [provider]  Active Spouse/Significant Other, Pharmacy Records  docusate sodium  (COLACE) 100 MG capsule [id number] Yes Take 100 mg by mouth 3 (three) times a week.  Patient taking differently: Take 100 mg by mouth 3 (three) times a week.   [provider]  Active Spouse/Significant Other, Pharmacy Records  doxazosin  (CARDURA ) 1 MG tablet [id number] Yes TAKE 1 TABLET (1 MG TOTAL) BY MOUTH DAILY. FOR SBP GREATER THAN 140 Bensimhon, Toribio SAUNDERS, MD  Active Spouse/Significant Other, Pharmacy Records  ENTRESTO  97-103 MG [id number] Yes TAKE 1 TABLET BY MOUTH TWICE A DAY Bensimhon, Toribio SAUNDERS, MD  Active Spouse/Significant Other, Pharmacy Records  FARXIGA  10 MG TABS tablet [id number] Yes TAKE 1 TABLET BY MOUTH EVERY DAY Bensimhon, Toribio SAUNDERS, MD  Active Spouse/Significant Other, Pharmacy Records  furosemide  (LASIX ) 20 MG tablet [id number] Yes TAKE 1 TABLET BY MOUTH 4 TIMES A WEEK.  Patient taking differently: Take 20 mg by mouth 3 (three) times a week. Monday, Thursday, and Saturday   Bensimhon, Toribio SAUNDERS, MD  Active Spouse/Significant Other, Pharmacy Records  levothyroxine  (SYNTHROID ) 150 MCG tablet [id number] Yes TAKE 1 TABLET  BY MOUTH EVERY DAY Burchette, Wolm ORN, MD  Active Spouse/Significant Other, Pharmacy Records  mexiletine (MEXITIL ) 150 MG capsule [id number] Yes TAKE 2 CAPSULES BY MOUTH EVERY MORNING AND 2 CAPSULES BY MOUTH EVERY EVENING. Bensimhon, Toribio SAUNDERS, MD   Active Spouse/Significant Other, Pharmacy Records  Multiple Vitamins-Minerals [place]) CHEW [id number] Yes Chew 1 tablet by mouth at bedtime. [provider]  Active Spouse/Significant Other, Pharmacy Records  omeprazole  (PRILOSEC) 20 MG capsule [id number] Yes Take 20 mg by mouth daily as needed (Stomach). [provider]  Active Spouse/Significant Other, Pharmacy Records  ranolazine  (RANEXA ) 500 MG 12 hr tablet [id number] Yes TAKE 1 TABLET BY MOUTH TWICE A DAY Bensimhon, Toribio SAUNDERS, MD  Active Spouse/Significant Other, Pharmacy Records  sertraline  (ZOLOFT ) 50 MG tablet [id number] Yes TAKE 1 TABLET BY MOUTH EVERY DAY Burchette, Wolm ORN, MD  Active Spouse/Significant Other, Pharmacy Records  traMADol  (ULTRAM ) 50 MG tablet [id number] Yes Take 1-2 tablets (50-100 mg total) by mouth every 6 (six) hours as needed for up to 7 days for severe pain (pain score 7-10). Ann Fine, MD  Active             Home Care and Equipment/Supplies: Were Home Health Services Ordered?: No Any new equipment or medical supplies ordered?: No  Functional Questionnaire: Do you need assistance with bathing/showering or dressing?: Yes (wife) Do you need assistance with meal preparation?: Yes (wife) Do you need assistance with eating?: No Do you have difficulty maintaining continence: Yes (diarrhea) Do you need assistance with getting out of bed/getting out of a chair/moving?: No Do you have difficulty managing or taking your medications?: Yes (wife)  Follow up appointments reviewed: PCP Follow-up appointment confirmed?: No (wife will call today) MD Provider Line Number:[phone] Given: No Specialist Hospital Follow-up appointment confirmed?: No Reason Specialist Follow-Up Not Confirmed: Patient has Specialist Provider Number and will Call for Appointment Do you need transportation to your follow-up appointment?: No Do you understand care options if your condition(s) worsen?: Yes-patient  verbalized understanding  SDOH Interventions Today    Flowsheet Row Most Recent Value  SDOH Interventions   Food Insecurity Interventions Intervention Not Indicated  Housing Interventions Intervention Not Indicated  Transportation Interventions Intervention Not Indicated  Utilities Interventions Intervention Not Indicated   Today's Vitals   04/07/24 0945 04/07/24 0949  BP:  (!) 146/88  Pulse:  73  Weight: 195 lb (88.5 kg) 195 lb (88.5 kg)  PainSc:  6    Placed call to patient who requested I speak with wife Bradley [name].    Wife states that patient has had diarrhea and that the MD suggested to get his bowels moving. Wife has been giving patient 2-3 Colace per day.  She reports that she will not give him one today. Wife manages all medications.    Goals Addressed             This Visit's Progress    VBCI Transitions of Care (TOC) Care Plan       Problems:  Recent Hospitalization for treatment of small bowel obstruction with inguinal hernia repair.  No Hospital Follow Up Provider appointment    Goal:  Over the next 30 days, the patient will not experience hospital readmission  Interventions:  Transitions of Care: Doctor Visits  - discussed the importance of doctor visits Post discharge activity limitations prescribed by provider reviewed Post-op wound/incision care reviewed with patient/caregiver Reviewed Signs and symptoms of infection Encouraged  wife to call PCP and surgeon and schedule appointment. Reviewed pain and bruising Encouraged  patient to wear underwear and elevate scrotum in a hand towel.  Reviewed importance of daily weights Reviewed importance of low salt diet.  Reviewed and offered 30 day TOC program and wife consented.   Patient Self Care Activities:  Attend all scheduled provider appointments Call pharmacy for medication refills 3-7 days in advance of running out of medications Call provider office for new concerns or questions  Notify RN Care Manager of  TOC call rescheduling needs Participate in Transition of Care Program/Attend TOC scheduled calls Take medications as prescribed    Plan:  Telephone follow up appointment with care management team member scheduled for:  04/15/2024  10 am  The patient has been provided with contact information for the care management team and has been advised to call with any health related questions or concerns.          Alan Ee, RN, BSN, CEN [employer]- Transition of Care Team.  [facility] [phone]

## 2024-04-09 ENCOUNTER — Ambulatory Visit (INDEPENDENT_AMBULATORY_CARE_PROVIDER_SITE_OTHER): Admitting: Family Medicine

## 2024-04-09 ENCOUNTER — Encounter: Payer: Self-pay | Admitting: Family Medicine

## 2024-04-09 VITALS — BP 120/70 | HR 74 | Temp 97.7°F | Wt 199.3 lb

## 2024-04-09 DIAGNOSIS — E785 Hyperlipidemia, unspecified: Secondary | ICD-10-CM

## 2024-04-09 DIAGNOSIS — E890 Postprocedural hypothyroidism: Secondary | ICD-10-CM

## 2024-04-09 DIAGNOSIS — D72829 Elevated white blood cell count, unspecified: Secondary | ICD-10-CM

## 2024-04-09 DIAGNOSIS — N182 Chronic kidney disease, stage 2 (mild): Secondary | ICD-10-CM | POA: Diagnosis not present

## 2024-04-09 LAB — CBC WITH DIFFERENTIAL/PLATELET
Basophils Absolute: 0 K/uL (ref 0.0–0.1)
Basophils Relative: 0.3 % (ref 0.0–3.0)
Eosinophils Absolute: 0.2 K/uL (ref 0.0–0.7)
Eosinophils Relative: 2.3 % (ref 0.0–5.0)
HCT: 51.6 % (ref 39.0–52.0)
Hemoglobin: 17.2 g/dL — ABNORMAL HIGH (ref 13.0–17.0)
Lymphocytes Relative: 11.7 % — ABNORMAL LOW (ref 12.0–46.0)
Lymphs Abs: 1.1 K/uL (ref 0.7–4.0)
MCHC: 33.4 g/dL (ref 30.0–36.0)
MCV: 93.7 fl (ref 78.0–100.0)
Monocytes Absolute: 0.7 K/uL (ref 0.1–1.0)
Monocytes Relative: 7.5 % (ref 3.0–12.0)
Neutro Abs: 7.7 K/uL (ref 1.4–7.7)
Neutrophils Relative %: 78.2 % — ABNORMAL HIGH (ref 43.0–77.0)
Platelets: 205 K/uL (ref 150.0–400.0)
RBC: 5.51 Mil/uL (ref 4.22–5.81)
RDW: 14.1 % (ref 11.5–15.5)
WBC: 9.8 K/uL (ref 4.0–10.5)

## 2024-04-09 NOTE — Progress Notes (Signed)
 Established Patient Office Visit  Subjective   Patient ID: Bradley [name]., male    DOB: 1955/09/16  Age: 68 y.o. MRN: [id number]  Chief Complaint  Patient presents with   Medical Management of Chronic Issues    HPI   Bradley [name] is seen for hospital follow-up following recent surgery for right inguinal hernia.  He has multiple chronic problems include history of CAD, systolic heart failure, hypertension, hypothyroidism, chronic kidney disease stage II, history of aortic valve replacement with redo secondary to valve endocarditis, history of recurrent depression.  He recently presented to the ER on 9 -1 with incarcerated hernia and this was actually reduced.  He was then readmitted on 3 September and discharged on the fifth after having surgery on the fourth.  He had had small bowel obstruction secondary to inguinal hernia and underwent open right inguinal hernia pair on the fourth.  Has done well since then.  No further nausea or vomiting.  Eating fairly well.  Chronic heart failure which has been stable.  No evidence for volume overload since surgery.  He has hypothyroidism and is on levothyroxine  and no thyroid  testing in over a year.  Also has hyperlipidemia treated with atorvastatin .  Apparently only taking 20 mg recently.  No recent lipid panel.  He had leukocytosis recently prior to his surgery probably related to his incarcerated hernia.  Postoperative CBCs revealed normal hemoglobin and normal white count.  He had some expected bruising from the right inguinal hernia but no significant pain.  No warmth or erythema.  Past Medical History:  Diagnosis Date   Anxiety    Aortic stenosis    s/p Bentall with bioprosthetic AVR 02/2010; Last echo (9/11): Moderate LVH, EF 45-50%, AVR functioning appropriately, aortic valve mean gradient 21, diastolic dysfunction. Chest MRA (2/13): Mild to moderate dilatation at the sinus of Valsalva at 4.1 cm, mild dilatation ascending aorta distal to  the tube graft at 3.9 cm, moderate dilatation of the innominate artery a 2.1 cm;     CHF (congestive heart failure) (HCC)    Depression    ESRD on dialysis (HCC)    a. 09/2013 felt to be related to gentamycin b. no longer requiring HD   Hx of cardiac cath    a. LHC in 02/2010: normal cors   Hx of echocardiogram 02/2014   Echo (8/15):  Mod LV, EF 35-40%, Gr 1 DD, AVR ok (mean 14 mmHg), mild LAE   Hypertension    Hypothyroidism, postsurgical    Prosthetic valve endocarditis    Staphylococcus aureus bacteremia    Thyroid  cancer (HCC)    Ventricular tachycardia (HCC)    a. s/p STJ ICD   Past Surgical History:  Procedure Laterality Date   AORTIC VALVE REPAIR  1968   AORTIC VALVE REPLACEMENT  2011   AV FISTULA PLACEMENT Left 10/08/2013   Procedure: ARTERIOVENOUS (AV) FISTULA CREATION- LEFT ARM; Radial Cephalic ;  Surgeon: Lynwood JONETTA Collum, MD;  Location: [facility] OR;  Service: Vascular;  Laterality: Left;   BENTALL PROCEDURE N/A 10/28/2013   Procedure: REDO BENTALL PROCEDURE, debridment of aoritc root abscess, replacement of aortic root, ascending aorta and aortic valve with homograft. Insertion of left femoral arterial line;  Surgeon: Dallas KATHEE Jude, MD;  Location: [facility] OR;  Service: Open Heart Surgery;  Laterality: N/A;   BIOPSY  07/22/2019   Procedure: BIOPSY;  Surgeon: Legrand Victory LITTIE DOUGLAS, MD;  Location: [facility] ENDOSCOPY;  Service: Gastroenterology;;   CARDIAC CATHETERIZATION  03/02/2010  NORMAL CORONARY ARTERY   CARDIAC CATHETERIZATION N/A 01/01/2015   Procedure: Coronary/Graft Angiography;  Surgeon: Debby DELENA Sor, MD;  Location: [facility] INVASIVE CV LAB;  Service: Cardiovascular;  Laterality: N/A;   CARDIAC VALVE REPLACEMENT     COLONOSCOPY WITH PROPOFOL  N/A 07/22/2019   Procedure: COLONOSCOPY WITH PROPOFOL ;  Surgeon: Legrand Victory LITTIE DOUGLAS, MD;  Location: [facility];  Service: Gastroenterology;  Laterality: N/A;   EP IMPLANTABLE DEVICE N/A 01/01/2015   Procedure: ICD Implant;  Surgeon: Elspeth JAYSON Sage, MD;   Location: [facility] INVASIVE CV LAB;  Service: Cardiovascular;  Laterality: N/A;   HEMOSTASIS CLIP PLACEMENT  07/22/2019   Procedure: HEMOSTASIS CLIP PLACEMENT;  Surgeon: Legrand Victory LITTIE DOUGLAS, MD;  Location: [facility];  Service: Gastroenterology;;   ICD GENERATOR CHANGEOUT N/A 02/11/2024   Procedure: ICD GENERATOR CHANGEOUT;  Surgeon: Inocencio Soyla Lunger, MD;  Location: [facility] INVASIVE CV LAB;  Service: Cardiovascular;  Laterality: N/A;   INGUINAL HERNIA REPAIR Right 04/03/2024   Procedure: REPAIR, HERNIA, INGUINAL, ADULT;  Surgeon: Ann Fine, MD;  Location: [facility] OR;  Service: General;  Laterality: Right;   INSERTION OF MESH Right 04/03/2024   Procedure: INSERTION OF MESH;  Surgeon: Ann Fine, MD;  Location: [facility] OR;  Service: General;  Laterality: Right;   INTRAOPERATIVE TRANSESOPHAGEAL ECHOCARDIOGRAM N/A 10/28/2013   Procedure: INTRAOPERATIVE TRANSESOPHAGEAL ECHOCARDIOGRAM;  Surgeon: Dallas KATHEE Jude, MD;  Location: [facility] OR;  Service: Open Heart Surgery;  Laterality: N/A;   PERIPHERAL VASCULAR CATHETERIZATION N/A 01/01/2015   Procedure: Aortic Arch Angiography;  Surgeon: Debby DELENA Sor, MD;  Location: [facility] INVASIVE CV LAB;  Service: Cardiovascular;  Laterality: N/A;   POLYPECTOMY  07/22/2019   Procedure: POLYPECTOMY;  Surgeon: Legrand Victory LITTIE DOUGLAS, MD;  Location: [facility];  Service: Gastroenterology;;   RIGHT/LEFT HEART CATH AND CORONARY ANGIOGRAPHY N/A 07/09/2023   Procedure: RIGHT/LEFT HEART CATH AND CORONARY ANGIOGRAPHY;  Surgeon: Cherrie Toribio SAUNDERS, MD;  Location: [facility] INVASIVE CV LAB;  Service: Cardiovascular;  Laterality: N/A;   SHOULDER ARTHROSCOPY W/ ROTATOR CUFF REPAIR Right 2012   STERNOTOMY     REDO   TEE WITHOUT CARDIOVERSION N/A 10/24/2013   Procedure: TRANSESOPHAGEAL ECHOCARDIOGRAM (TEE);  Surgeon: Leim VEAR Moose, MD;  Location: [facility] ENDOSCOPY;  Service: Cardiovascular;  Laterality: N/A;   THYROIDECTOMY  ~ 2005   TRANSTHORACIC ECHOCARDIOGRAM  03/2010   SHOWED MILD REDUCTION OF LV  FUNCTION    reports that he has never smoked. He has never used smokeless tobacco. He reports that he does not drink alcohol and does not use drugs. family history includes Heart disease in his father; Hypertension in his father and mother; Kidney Stones in his sister; Stroke in his father; Thyroid  cancer in his sister and sister. Allergies  Allergen Reactions   Oxycodone  Other (See Comments)    Gives patient nightmares   Iodinated Contrast Media Rash   Rifampin  Nausea Only      Review of Systems  Constitutional:  Negative for chills and fever.  Respiratory:  Negative for shortness of breath.   Cardiovascular:  Negative for chest pain.  Gastrointestinal:  Negative for diarrhea, nausea and vomiting.      Objective:     BP 120/70   Pulse 74   Temp 97.7 F (36.5 C) (Oral)   Wt 199 lb 4.8 oz (90.4 kg)   SpO2 96%   BMI 26.29 kg/m  BP Readings from Last 3 Encounters:  04/09/24 120/70  04/07/24 (!) 146/88  04/04/24 (!) 133/92   Wt Readings from Last 3 Encounters:  04/09/24 199 lb 4.8 oz (90.4 kg)  04/07/24 195 lb (88.5 kg)  04/03/24 198 lb 6.6 oz (90 kg)      Physical Exam Vitals reviewed.  Constitutional:      General: He is not in acute distress.    Appearance: He is not ill-appearing.  Cardiovascular:     Rate and Rhythm: Normal rate and regular rhythm.  Pulmonary:     Effort: Pulmonary effort is normal.     Breath sounds: Normal breath sounds.  Genitourinary:    Comments: Right inguinal region examined.  Wound appears good.  No drainage.  Wound is intact.  No surrounding erythema.  He has significant surrounding ecchymosis and some expected swelling around the wound site but no tenderness.  No fluctuance.  No warmth. Neurological:     Mental Status: He is alert.      No results found for any visits on 04/09/24.  Last CBC Lab Results  Component Value Date   WBC 9.2 04/04/2024   HGB 14.2 04/04/2024   HCT 41.9 04/04/2024   MCV 92.1 04/04/2024   MCH  31.2 04/04/2024   RDW 13.7 04/04/2024   PLT 112 (L) 04/04/2024   Last metabolic panel Lab Results  Component Value Date   GLUCOSE 152 (H) 04/04/2024   NA 141 04/04/2024   K 4.1 04/04/2024   CL 110 04/04/2024   CO2 22 04/04/2024   BUN 21 04/04/2024   CREATININE 1.16 04/04/2024   GFRNONAA >60 04/04/2024   CALCIUM  8.7 (L) 04/04/2024   PHOS 4.8 (H) 11/28/2013   PROT 5.4 (L) 04/04/2024   ALBUMIN  2.7 (L) 04/04/2024   BILITOT 0.6 04/04/2024   ALKPHOS 31 (L) 04/04/2024   AST 15 04/04/2024   ALT 12 04/04/2024   ANIONGAP 9 04/04/2024      The ASCVD Risk score (Arnett DK, et al., 2019) failed to calculate for the following reasons:   Risk score cannot be calculated because patient has a medical history suggesting prior/existing ASCVD    Assessment & Plan:   Problem List Items Addressed This Visit       Unprioritized   Hypothyroidism, postsurgical - Primary   Relevant Orders   TSH   CKD (chronic kidney disease), stage II   Relevant Orders   Basic metabolic panel with GFR   Other Visit Diagnoses       Leukocytosis, unspecified type       Relevant Orders   CBC with Differential/Platelet     Hyperlipidemia, unspecified hyperlipidemia type       Relevant Orders   Lipid panel     Status post recent surgery for inguinal hernia.  Patient had incarcerated hernia and bowel obstruction prior to his surgery.  Doing well since surgery.  Eating well.  No fever.  No evidence for volume overload.  -Obtaining labs as above.  Patient overdue for follow-up lipids and thyroid  testing. - Discussed importance of adequate protein and nutrition following surgery - Wife and patient are diligent to watch sodium intake. - Follow-up immediately for any fever or other concerns  No follow-ups on file.    Wolm Scarlet, MD

## 2024-04-10 ENCOUNTER — Other Ambulatory Visit: Payer: Self-pay | Admitting: Family Medicine

## 2024-04-15 ENCOUNTER — Other Ambulatory Visit: Payer: Self-pay

## 2024-04-15 NOTE — Transitions of Care (Post Inpatient/ED Visit) (Signed)
 Transition of Care week 2  Visit Note  04/15/2024  Name: Bradley [name]. MRN: [id number]          DOB: 12/21/1955  Situation: Patient enrolled in [facility] 30-day program. Visit completed with patient and wife by telephone.   Background:   Initial Transition Care Management Follow-up Telephone Call    Past Medical History:  Diagnosis Date   Anxiety    Aortic stenosis    s/p Bentall with bioprosthetic AVR 02/2010; Last echo (9/11): Moderate LVH, EF 45-50%, AVR functioning appropriately, aortic valve mean gradient 21, diastolic dysfunction. Chest MRA (2/13): Mild to moderate dilatation at the sinus of Valsalva at 4.1 cm, mild dilatation ascending aorta distal to the tube graft at 3.9 cm, moderate dilatation of the innominate artery a 2.1 cm;     CHF (congestive heart failure) (HCC)    Depression    ESRD on dialysis (HCC)    a. 09/2013 felt to be related to gentamycin b. no longer requiring HD   Hx of cardiac cath    a. LHC in 02/2010: normal cors   Hx of echocardiogram 02/2014   Echo (8/15):  Mod LV, EF 35-40%, Gr 1 DD, AVR ok (mean 14 mmHg), mild LAE   Hypertension    Hypothyroidism, postsurgical    Prosthetic valve endocarditis    Staphylococcus aureus bacteremia    Thyroid  cancer (HCC)    Ventricular tachycardia (HCC)    a. s/p STJ ICD    Assessment: Patient reports that he is feeling some better.   Patient Reported Symptoms: Cognitive Cognitive Status: Able to follow simple commands, Alert and oriented to person, place, and time, Normal speech and language skills      Neurological Neurological Review of Symptoms: No symptoms reported    HEENT HEENT Symptoms Reported: No symptoms reported      Cardiovascular Cardiovascular Symptoms Reported: No symptoms reported Weight: 194 lb (88 kg)  Respiratory Respiratory Symptoms Reported: No symptoms reported    Endocrine Endocrine Symptoms Reported: No symptoms reported Is patient diabetic?: No Endocrine Self-Management Outcome:  5 (very good)  Gastrointestinal Gastrointestinal Symptoms Reported: Other Other Gastrointestinal Symptoms: wife and patient report no diarrhea.  Bowels moving daily but patient feels they could move better.  Wife states that she has been given him colace every other day and reports she will give him colace daily. Gastrointestinal Management Strategies: Activity, Exercise, Medication therapy Gastrointestinal Self-Management Outcome: 4 (good)    Genitourinary Genitourinary Symptoms Reported: No symptoms reported    Integumentary Integumentary Symptoms Reported: Bruising Additional Integumentary Details: Wife reports decrease pain, decrease swelling and decrease bruising.    Musculoskeletal Musculoskelatal Symptoms Reviewed: No symptoms reported        Psychosocial Psychosocial Symptoms Reported: No symptoms reported         Today's Vitals   04/15/24 1009  Weight: 194 lb (88 kg)     Medications Reviewed Today     Reviewed by Rumalda Alan PENNER, RN (Registered Nurse) on 04/15/24 at [id number]  Med List Status: <None>   Medication Order Taking? Sig Documenting Provider Last Dose Status Informant  acetaminophen  (TYLENOL ) 500 MG tablet [id number] Yes Take 2 tablets (1,000 mg total) by mouth every 6 (six) hours as needed. Tammy Sor, PA-C  Active   amoxicillin (AMOXIL) 500 MG capsule [id number] Yes SMARTSIG:4 Capsule(s) By Mouth [provider]  Active Spouse/Significant Other, Pharmacy Records           Med Note (CRUTHIS, CHLOE C   Tue Apr 01, 2024  7:17 AM)    ARIPiprazole  (ABILIFY ) 5 MG tablet [id number] Yes Take 1 tablet nightly Micheal Wolm ORN, MD  Active Spouse/Significant Other, Pharmacy Records  aspirin  EC 81 MG tablet [id number] Yes Take 81 mg by mouth at bedtime.  [provider]  Active Spouse/Significant Other, Pharmacy Records  atorvastatin  (LIPITOR) 40 MG tablet [id number] Yes TAKE 1 TABLET BY MOUTH EVERY DAY  Patient taking differently: TAKE 1 TABLET BY MOUTH  EVERY DAY   Burchette, Wolm ORN, MD  Active Spouse/Significant Other, Pharmacy Records  carvedilol  (COREG ) 6.25 MG tablet [id number] Yes TAKE 1 TABLET BY MOUTH TWICE A DAY WITH FOOD Bensimhon, Toribio SAUNDERS, MD  Active Spouse/Significant Other, Pharmacy Records  clonazePAM  (KLONOPIN ) 1 MG tablet [id number] Yes Take 0.5 mg by mouth daily as needed for anxiety. [provider]  Active Spouse/Significant Other, Pharmacy Records  docusate sodium  (COLACE) 100 MG capsule [id number] Yes Take 100 mg by mouth 3 (three) times a week.  Patient taking differently: Take 100 mg by mouth 3 (three) times a week.   [provider]  Active Spouse/Significant Other, Pharmacy Records  doxazosin  (CARDURA ) 1 MG tablet [id number] Yes TAKE 1 TABLET (1 MG TOTAL) BY MOUTH DAILY. FOR SBP GREATER THAN 140 Bensimhon, Toribio SAUNDERS, MD  Active Spouse/Significant Other, Pharmacy Records  ENTRESTO  97-103 MG [id number] Yes TAKE 1 TABLET BY MOUTH TWICE A DAY Bensimhon, Toribio SAUNDERS, MD  Active Spouse/Significant Other, Pharmacy Records  FARXIGA  10 MG TABS tablet [id number] Yes TAKE 1 TABLET BY MOUTH EVERY DAY Bensimhon, Toribio SAUNDERS, MD  Active Spouse/Significant Other, Pharmacy Records  furosemide  (LASIX ) 20 MG tablet [id number] Yes TAKE 1 TABLET BY MOUTH 4 TIMES A WEEK.  Patient taking differently: Take 20 mg by mouth 3 (three) times a week. Monday, Thursday, and Saturday   Bensimhon, Toribio SAUNDERS, MD  Active Spouse/Significant Other, Pharmacy Records  levothyroxine  (SYNTHROID ) 150 MCG tablet [id number] Yes TAKE 1 TABLET BY MOUTH EVERY DAY Burchette, Wolm ORN, MD  Active   mexiletine (MEXITIL ) 150 MG capsule [id number] Yes TAKE 2 CAPSULES BY MOUTH EVERY MORNING AND 2 CAPSULES BY MOUTH EVERY EVENING. Bensimhon, Toribio SAUNDERS, MD  Active Spouse/Significant Other, Pharmacy Records  Multiple Vitamins-Minerals [place]) CHEW [id number] Yes Chew 1 tablet by mouth at bedtime. [provider]  Active Spouse/Significant Other, Pharmacy Records   omeprazole  (PRILOSEC) 20 MG capsule [id number] Yes Take 20 mg by mouth daily as needed (Stomach). [provider]  Active Spouse/Significant Other, Pharmacy Records  ranolazine  (RANEXA ) 500 MG 12 hr tablet [id number] Yes TAKE 1 TABLET BY MOUTH TWICE A DAY Bensimhon, Toribio SAUNDERS, MD  Active Spouse/Significant Other, Pharmacy Records  sertraline  (ZOLOFT ) 50 MG tablet [id number] Yes TAKE 1 TABLET BY MOUTH EVERY DAY Burchette, Wolm ORN, MD  Active Spouse/Significant Other, Pharmacy Records            Goals       DIET - REDUCE SODIUM INTAKE      Increase physical activity (pt-stated)      Walk more and diet changes.      [facility] Transitions of Care (TOC) Care Plan      Problems:  Recent Hospitalization for treatment of small bowel obstruction with inguinal hernia repair.  04/15/2024  Patient and wife reports decrease swelling in scrotum and decrease bruising.  Wife reports that patient wore under ware  for 2-3 days and then took off.  Patient reports that bowels move daily but he feels it could be better.  Taking colace every other day. Wife  reports patient lays around a lot and is not very active.  No Hospital Follow Up Provider appointment 04/15/2024  Completed hospital follow up appointment.   Goal:  Over the next 30 days, the patient will not experience hospital readmission  Interventions:  Transitions of Care: Doctor Visits  - discussed the importance of doctor visits Post discharge activity limitations prescribed by provider reviewed Reviewed Signs and symptoms of infection Reviewed swelling and bruising Reviewed importance of daily weights Reviewed importance of low salt diet Reviewed and encouraged patient to be more active. Assess frequency of bowel movements. Encouraged a balanced diet   Patient Self Care Activities:  Attend all scheduled provider appointments Call pharmacy for medication refills 3-7 days in advance of running out of medications Call provider office for  new concerns or questions  Notify RN Care Manager of TOC call rescheduling needs Participate in Transition of Care Program/Attend TOC scheduled calls Take medications as prescribed    Plan:  Telephone follow up appointment with care management team member scheduled for:  04/22/2024  11 am  The patient has been provided with contact information for the care management team and has been advised to call with any health related questions or concerns.         Recommendation:   Continue Current Plan of Care  Follow Up Plan:   Telephone follow up appointment date/time:  04/22/2024 at 11 am  Alan Ee, RN, BSN, [employer]- Transition of Care Team.  [facility] [employer] [phone]

## 2024-04-15 NOTE — Patient Instructions (Signed)
 Visit Information  Thank you for taking time to visit with me today. Please don't hesitate to contact me if I can be of assistance to you before our next scheduled telephone appointment.  Our next appointment is by telephone on 04/22/2024 at 11am  Following is a copy of your care plan:   Goals Addressed             This Visit's Progress    [facility] Transitions of Care (TOC) Care Plan       Problems:  Recent Hospitalization for treatment of small bowel obstruction with inguinal hernia repair.  04/15/2024  Patient and wife reports decrease swelling in scrotum and decrease bruising.  Wife reports that patient wore under ware  for 2-3 days and then took off.  Patient reports that bowels move daily but he feels it could be better.  Taking colace every other day. Wife reports patient lays around a lot and is not very active.  No Hospital Follow Up Provider appointment 04/15/2024  Completed hospital follow up appointment.   Goal:  Over the next 30 days, the patient will not experience hospital readmission  Interventions:  Transitions of Care: Doctor Visits  - discussed the importance of doctor visits Post discharge activity limitations prescribed by provider reviewed Reviewed Signs and symptoms of infection Reviewed swelling and bruising Reviewed importance of daily weights Reviewed importance of low salt diet Reviewed and encouraged patient to be more active. Assess frequency of bowel movements. Encouraged a balanced diet   Patient Self Care Activities:  Attend all scheduled provider appointments Call pharmacy for medication refills 3-7 days in advance of running out of medications Call provider office for new concerns or questions  Notify RN Care Manager of TOC call rescheduling needs Participate in Transition of Care Program/Attend TOC scheduled calls Take medications as prescribed    Plan:  Telephone follow up appointment with care management team member scheduled for:   04/22/2024  11 am  The patient has been provided with contact information for the care management team and has been advised to call with any health related questions or concerns.         Patient verbalizes understanding of instructions and care plan provided today and agrees to view in MyChart. Active MyChart status and patient understanding of how to access instructions and care plan via MyChart confirmed with patient.     Telephone follow up appointment with care management team member scheduled for:   04/22/2024 at 11 am  Please call the care guide team at [phone] if you need to cancel or reschedule your appointment.   Please call the Suicide and Crisis Lifeline: 988 call the [place]  National Suicide Prevention Lifeline: 9208684401 or TTY: 640 800 0881 TTY (551)[phone]) to talk to a trained counselor call 1-800-273-TALK (toll free, 24 hour hotline) call 911 if you are experiencing a Mental Health or Behavioral Health Crisis or need someone to talk to.  Alan Ee, RN, BSN, CEN [employer]- Transition of Care Team.  [facility] [phone]

## 2024-04-15 NOTE — Progress Notes (Signed)
Remote ICD Transmission.

## 2024-04-17 NOTE — Progress Notes (Signed)
   PROVIDER:  RICHERD SILVERSMITH, MD  MRN: [id number] DOB: 1955/08/02 DATE OF ENCOUNTER: 04/17/2024 Interval History:     Patient is s/p open right inguinal hernia repair with mesh on 04/03/24. Since surgery, patient states that their pain has been well controlled. They are tolerating a regular diet and having normal bowel function.     Objective  Physical Exam Gen: NAD CV: RRR Pulm: NWOB Abd: Soft, nontender, some expected firmness from healing at inguinal incision, small 4-61mm area of erythema to incision that appears reactive not infectious. Small reducible left sided inguinal hernia without pain or discomfort, reducible.  Pathology None  Assessment and Plan:     Bradley [name] is a 68 y.o. male who is s/p open right inguinal hernia repair with mesh on 04/03/24.  Diagnoses and all orders for this visit:  S/P right inguinal hernia repair  Left inguinal hernia     - Follow up PRN - Continue weight bearing restrictions x 6 weeks total post-op - Known left sided fat containing hernia to be observed, if starts to cause discomfort or grows in size, patient knows to call to be seen for eval of left sided repair   The plan was discussed in detail with the patient today, who expressed understanding.  The patient has our contact information, and understands to call the clinic with any additional questions or concerns in the interval.  I would be happy to see the patient back.  RICHERD SILVERSMITH, MD

## 2024-04-20 ENCOUNTER — Other Ambulatory Visit: Payer: Self-pay | Admitting: Internal Medicine

## 2024-04-21 ENCOUNTER — Ambulatory Visit

## 2024-04-22 ENCOUNTER — Other Ambulatory Visit: Payer: Self-pay

## 2024-04-22 NOTE — Patient Instructions (Signed)
 Visit Information  Thank you for taking time to visit with me today.  Following is a copy of your care plan:   Goals Addressed             This Visit's Progress    COMPLETED: [facility] Transitions of Care (TOC) Care Plan       Problems:  Recent Hospitalization for treatment of small bowel obstruction with inguinal hernia repair.  04/15/2024  Patient and wife reports decrease swelling in scrotum and decrease bruising.  Wife reports that patient wore under ware  for 2-3 days and then took off.  Patient reports that bowels move daily but he feels it could be better.  Taking colace every other day. Wife reports patient lays around a lot and is not very active. 04/22/2024 Wife reports that patient had follow up with surgeon. Reports patient is doing well and has no new concerns. Reports bowels are moving every other day. Soft.  No Hospital Follow Up Provider appointment 04/15/2024  Completed hospital follow up appointment.  09/223/2025  Has completed PCP and surgeon followup.   Goal:  Over the next 30 days, the patient will not experience hospital readmission  Interventions:  Transitions of Care: Doctor Visits  - discussed the importance of doctor visits Post discharge activity limitations prescribed by provider reviewed- reviewed continue activity restriction per surgeon Reviewed swelling and bruising Reviewed importance of daily weights Reviewed importance of low salt diet Reviewed and encouraged patient to be more active. Assess frequency of bowel movements. Offered to schedule additional TOC calls and wife declines and states that they are doing well.     Patient Self Care Activities:  Attend all scheduled provider appointments Call pharmacy for medication refills 3-7 days in advance of running out of medications Call provider office for new concerns or questions  Take medications as prescribed    Plan:  Case closed- declined any further calls.           Case closed. Denies  need for any additional calls  Please call the care guide team at [phone] if you need to cancel or reschedule your appointment.   Please call the Suicide and Crisis Lifeline: 988 call the [place]  National Suicide Prevention Lifeline: (780)342-0594 or TTY: 438-447-2805 TTY 321-467-8139) to talk to a trained counselor call 1-800-273-TALK (toll free, 24 hour hotline) call 911 if you are experiencing a Mental Health or Behavioral Health Crisis or need someone to talk to.  Alan Ee, RN, BSN, CEN [employer]- Transition of Care Team.  [facility] [phone]

## 2024-04-22 NOTE — Transitions of Care (Post Inpatient/ED Visit) (Signed)
 Transition of Care week 3  Visit Note  04/22/2024  Name: Bradley [name]. MRN: [id number]          DOB: [birth year]  Situation: Patient enrolled in [facility] 30-day program. Visit completed with wife, pt by telephone.   Background:   Initial Transition Care Management Follow-up Telephone Call    Past Medical History:  Diagnosis Date   Anxiety    Aortic stenosis    s/p Bentall with bioprosthetic AVR 02/2010; Last echo (9/11): Moderate LVH, EF 45-50%, AVR functioning appropriately, aortic valve mean gradient 21, diastolic dysfunction. Chest MRA (2/13): Mild to moderate dilatation at the sinus of Valsalva at 4.1 cm, mild dilatation ascending aorta distal to the tube graft at 3.9 cm, moderate dilatation of the innominate artery a 2.1 cm;     CHF (congestive heart failure) (HCC)    Depression    ESRD on dialysis (HCC)    a. 09/2013 felt to be related to gentamycin b. no longer requiring HD   Hx of cardiac cath    a. LHC in 02/2010: normal cors   Hx of echocardiogram 02/2014   Echo (8/15):  Mod LV, EF 35-40%, Gr 1 DD, AVR ok (mean 14 mmHg), mild LAE   Hypertension    Hypothyroidism, postsurgical    Prosthetic valve endocarditis    Staphylococcus aureus bacteremia    Thyroid  cancer (HCC)    Ventricular tachycardia (HCC)    a. s/p STJ ICD   Today's Vitals   04/21/24 1108 04/22/24 1107  BP: 138/80   Weight: 194 lb (88 kg)   PainSc:  0-No pain    Assessment: Wife reports that patient is not motivated to do things.  States patient like to just lay around. Has had follow up with PCP and surgeon.  Wife states no new concerns today and states that they do not need any additional calls.  Patient Reported Symptoms: Cognitive Cognitive Status: Able to follow simple commands, Alert and oriented to person, place, and time, Normal speech and language skills      Neurological Neurological Review of Symptoms: No symptoms reported    HEENT HEENT Symptoms Reported: No symptoms reported       Cardiovascular Cardiovascular Symptoms Reported: No symptoms reported Is patient checking Blood Pressure at home?: Yes Patient's Recent BP reading at home: 138/80  ( yesterday) Weight: 194 lb (88 kg)  Respiratory Respiratory Symptoms Reported: No symptoms reported    Endocrine Endocrine Symptoms Reported: No symptoms reported Is patient diabetic?: No    Gastrointestinal Other Gastrointestinal Symptoms: bowels movements every other day.   denies any NV.  denies abdominal pain      Genitourinary Genitourinary Symptoms Reported: No symptoms reported    Integumentary Integumentary Symptoms Reported: Other Other Integumentary Symptoms: decreased swelling and bruising.    Musculoskeletal Musculoskelatal Symptoms Reviewed: No symptoms reported        Psychosocial Psychosocial Symptoms Reported: No symptoms reported         Vitals:   04/21/24 1108  BP: 138/80    Medications Reviewed Today     Reviewed by Rumalda Alan PENNER, RN (Registered Nurse) on 04/22/24 at 1105  Med List Status: <None>   Medication Order Taking? Sig Documenting Provider Last Dose Status Informant  acetaminophen  (TYLENOL ) 500 MG tablet [id number] Yes Take 2 tablets (1,000 mg total) by mouth every 6 (six) hours as needed. Tammy Sor, PA-C  Active   amoxicillin (AMOXIL) 500 MG capsule [id number] Yes SMARTSIG:4 Capsule(s) By Mouth [provider]  Active Spouse/Significant Other, Pharmacy Records           Med Note (CRUTHIS, CHLOE C   Tue Apr 01, 2024  7:17 AM)    ARIPiprazole  (ABILIFY ) 5 MG tablet [id number] Yes Take 1 tablet nightly Micheal Wolm ORN, MD  Active Spouse/Significant Other, Pharmacy Records  aspirin  EC 81 MG tablet [id number] Yes Take 81 mg by mouth at bedtime.  [provider]  Active Spouse/Significant Other, Pharmacy Records  atorvastatin  (LIPITOR) 40 MG tablet [id number] Yes TAKE 1 TABLET BY MOUTH EVERY DAY  Patient taking differently: TAKE 1 TABLET BY MOUTH EVERY DAY    Burchette, Wolm ORN, MD  Active Spouse/Significant Other, Pharmacy Records  carvedilol  (COREG ) 6.25 MG tablet [id number] Yes TAKE 1 TABLET BY MOUTH TWICE A DAY WITH FOOD Bensimhon, Toribio SAUNDERS, MD  Active Spouse/Significant Other, Pharmacy Records  clonazePAM  (KLONOPIN ) 1 MG tablet [id number] Yes Take 0.5 mg by mouth daily as needed for anxiety. [provider]  Active Spouse/Significant Other, Pharmacy Records  docusate sodium  (COLACE) 100 MG capsule [id number] Yes Take 100 mg by mouth 3 (three) times a week.  Patient taking differently: Take 100 mg by mouth 3 (three) times a week.   [provider]  Active Spouse/Significant Other, Pharmacy Records  doxazosin  (CARDURA ) 1 MG tablet [id number] Yes TAKE 1 TABLET (1 MG TOTAL) BY MOUTH DAILY. FOR SBP GREATER THAN 140 Bensimhon, Toribio SAUNDERS, MD  Active Spouse/Significant Other, Pharmacy Records  ENTRESTO  97-103 MG [id number] Yes TAKE 1 TABLET BY MOUTH TWICE A DAY Bensimhon, Toribio SAUNDERS, MD  Active Spouse/Significant Other, Pharmacy Records  FARXIGA  10 MG TABS tablet [id number] Yes TAKE 1 TABLET BY MOUTH EVERY DAY Bensimhon, Toribio SAUNDERS, MD  Active   furosemide  (LASIX ) 20 MG tablet [id number] Yes TAKE 1 TABLET BY MOUTH 4 TIMES A WEEK.  Patient taking differently: Take 20 mg by mouth 3 (three) times a week. Monday, Thursday, and Saturday   Bensimhon, Toribio SAUNDERS, MD  Active Spouse/Significant Other, Pharmacy Records  levothyroxine  (SYNTHROID ) 150 MCG tablet [id number] Yes TAKE 1 TABLET BY MOUTH EVERY DAY Burchette, Wolm ORN, MD  Active   mexiletine (MEXITIL ) 150 MG capsule [id number] Yes TAKE 2 CAPSULES BY MOUTH EVERY MORNING AND 2 CAPSULES BY MOUTH EVERY EVENING. Bensimhon, Toribio SAUNDERS, MD  Active Spouse/Significant Other, Pharmacy Records  Multiple Vitamins-Minerals [place]) CHEW [id number] Yes Chew 1 tablet by mouth at bedtime. [provider]  Active Spouse/Significant Other, Pharmacy Records  omeprazole  (PRILOSEC) 20 MG capsule [id number] Yes Take 20 mg  by mouth daily as needed (Stomach). [provider]  Active Spouse/Significant Other, Pharmacy Records  ranolazine  (RANEXA ) 500 MG 12 hr tablet [id number] Yes TAKE 1 TABLET BY MOUTH TWICE A DAY Bensimhon, Toribio SAUNDERS, MD  Active Spouse/Significant Other, Pharmacy Records  sertraline  (ZOLOFT ) 50 MG tablet [id number] Yes TAKE 1 TABLET BY MOUTH EVERY DAY Burchette, Wolm ORN, MD  Active Spouse/Significant Other, Pharmacy Records            Goals Addressed             This Visit's Progress    COMPLETED: VBCI Transitions of Care (TOC) Care Plan       Problems:  Recent Hospitalization for treatment of small bowel obstruction with inguinal hernia repair.  04/15/2024  Patient and wife reports decrease swelling in scrotum and decrease bruising.  Wife reports that patient wore under ware  for 2-3 days and then took off.  Patient reports that bowels move daily but he feels it could be  better.  Taking colace every other day. Wife reports patient lays around a lot and is not very active. 04/22/2024 Wife reports that patient had follow up with surgeon. Reports patient is doing well and has no new concerns. Reports bowels are moving every other day. Soft.  No Hospital Follow Up Provider appointment 04/15/2024  Completed hospital follow up appointment.  09/223/2025  Has completed PCP and surgeon followup.   Goal:  Over the next 30 days, the patient will not experience hospital readmission  Interventions:  Transitions of Care: Doctor Visits  - discussed the importance of doctor visits Post discharge activity limitations prescribed by provider reviewed- reviewed continue activity restriction per surgeon Reviewed swelling and bruising Reviewed importance of daily weights Reviewed importance of low salt diet Reviewed and encouraged patient to be more active. Assess frequency of bowel movements. Offered to schedule additional TOC calls and wife declines and states that they are doing well.      Patient Self Care Activities:  Attend all scheduled provider appointments Call pharmacy for medication refills 3-7 days in advance of running out of medications Call provider office for new concerns or questions  Take medications as prescribed    Plan:  Case closed- declined any further calls.          Recommendation:   Continue Current Plan of Care  Follow Up Plan:   Closing From:  Transitions of Care Program  Alan Ee, RN, BSN, [employer]- Transition of Care Team.  [facility] [phone]

## 2024-04-22 NOTE — Progress Notes (Signed)
 No ICM remote transmission received for 04/21/2024 and next ICM transmission scheduled for 04/30/2024.

## 2024-04-23 NOTE — Progress Notes (Incomplete)
 [facility] Note  Date:  04/23/2024   ID:  Bradley [name]., DOB 08-06-1955, MRN [id number]  Location: Home  Provider location: [facility] Type of Visit: Established patient  PCP:  Micheal Wolm ORN, MD  Primary Cardiologist:  Vina Gull, MD HF Cardiologist: Dr. Cherrie  Chief Complaint: Heart Failure follow-up   HPI: Bradley [name] is a 68 y.o. y/o male with a complicated past medical history including thyroid  cancer (s/p thyroidectomy), HTN, depression, and congenital AS s/p aortic vavulotomy at age 62 with subsequent Bentall procedure in 2001 with a pericardial tissue valve.  He had MSSA bacteremia and bacterial endocarditis in 08/2013 complicated by renal failure requiring dialysis for a short period of time.  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     Admitted 11/2014 with monomorphic VT. Cath with normal coronaries.  Echo EF 30-35% -> STJ dual chamber ICD implant  Echo 9/21 EF 35-40% Echo 11/23 EF 25-30%. RV mildly down. AVR ok  Follow up 3/24, doing well NYHA II-III and volume stable on torsemide 20 mg 3x/week.   CPX 10/24 Resting HR: 58 Peak HR: 105   (68% age predicted max HR)  BP rest: 110/62 BP peak: 162/64  Peak VO2: 13.8 (53% predicted peak VO2)  VE/VCO2 slope:  35  Peak RER: 1.28  VE/MVV:  43%  O2pulse:  11   (73% predicted O2pulse)   Today he returns for HF follow up with his wife. Says he is doing pretty well. Denies CP or SOB. Can easily walk 1/4 mile without stopping and handle a flight of steps. Still struggling with his hernia. No VT     Echo 06/26/23  EF 20-25% RV moderately HK   Cardiac Studies: Echo (11/23): EF 25-30%, RV mildly down, AVR ok Echo (7/22): EF 35-40%  Echo (12/20): EF 30-35%  Echo (7/19): EF 30-35% AVR stable.  Echo (5/18): EF 25-30%    CPX  (01/01/17)  FEV1 3.40 (86%)        FEV1/FVC 83 (107%)        MVV 163 (108%) Resting HR: 72 Peak HR: 129   (80% age predicted max HR) BP rest: 118/70 BP peak: 174/84  Peak VO2: 21.1 (73% predicted peak VO2) VE/VCO2 slope:  35 OUES: 1.96 Peak RER:  1.22 Ventilatory Threshold: 16.6 (58% predicted or measured peak VO2) VE/MVV:  50% O2pulse:  15   (100% predicted O2pulse)   Past Medical History:  Diagnosis Date   Anxiety    Aortic stenosis    s/p Bentall with bioprosthetic AVR 02/2010; Last echo (9/11): Moderate LVH, EF 45-50%, AVR functioning appropriately, aortic valve mean gradient 21, diastolic dysfunction. Chest MRA (2/13): Mild to moderate dilatation at the sinus of Valsalva at 4.1 cm, mild dilatation ascending aorta distal to the tube graft at 3.9 cm, moderate dilatation of the innominate artery a 2.1 cm;     CHF (congestive heart failure) (HCC)    Depression    ESRD on dialysis (HCC)    a. 09/2013 felt to be related to gentamycin b. no longer requiring HD   Hx of cardiac cath    a. LHC in 02/2010: normal cors   Hx of echocardiogram 02/2014   Echo (8/15):  Mod LV, EF 35-40%, Gr 1 DD, AVR ok (mean 14 mmHg), mild LAE   Hypertension  Hypothyroidism, postsurgical    Prosthetic valve endocarditis    Staphylococcus aureus bacteremia    Thyroid  cancer (HCC)    Ventricular tachycardia (HCC)    a. s/p STJ ICD   Past Surgical History:  Procedure Laterality Date   AORTIC VALVE REPAIR  1968   AORTIC VALVE REPLACEMENT  2011   AV FISTULA PLACEMENT Left 10/08/2013   Procedure: ARTERIOVENOUS (AV) FISTULA CREATION- LEFT ARM; Radial Cephalic ;  Surgeon: Lynwood JONETTA Collum, MD;  Location: [facility] OR;  Service: Vascular;  Laterality: Left;   BENTALL PROCEDURE N/A 10/28/2013   Procedure: REDO BENTALL PROCEDURE, debridment of aoritc root abscess, replacement of aortic root, ascending aorta and aortic valve with homograft. Insertion of left femoral arterial line;  Surgeon: Dallas KATHEE Jude, MD;  Location: [facility]  OR;  Service: Open Heart Surgery;  Laterality: N/A;   BIOPSY  07/22/2019   Procedure: BIOPSY;  Surgeon: Legrand Victory LITTIE DOUGLAS, MD;  Location: [facility];  Service: Gastroenterology;;   CARDIAC CATHETERIZATION  03/02/2010   NORMAL CORONARY ARTERY   CARDIAC CATHETERIZATION N/A 01/01/2015   Procedure: Coronary/Graft Angiography;  Surgeon: Debby DELENA Sor, MD;  Location: [facility] INVASIVE CV LAB;  Service: Cardiovascular;  Laterality: N/A;   CARDIAC VALVE REPLACEMENT     COLONOSCOPY WITH PROPOFOL  N/A 07/22/2019   Procedure: COLONOSCOPY WITH PROPOFOL ;  Surgeon: Legrand Victory LITTIE DOUGLAS, MD;  Location: [facility];  Service: Gastroenterology;  Laterality: N/A;   EP IMPLANTABLE DEVICE N/A 01/01/2015   Procedure: ICD Implant;  Surgeon: Elspeth JAYSON Sage, MD;  Location: [facility] INVASIVE CV LAB;  Service: Cardiovascular;  Laterality: N/A;   HEMOSTASIS CLIP PLACEMENT  07/22/2019   Procedure: HEMOSTASIS CLIP PLACEMENT;  Surgeon: Legrand Victory LITTIE DOUGLAS, MD;  Location: [facility];  Service: Gastroenterology;;   ICD GENERATOR CHANGEOUT N/A 02/11/2024   Procedure: ICD GENERATOR CHANGEOUT;  Surgeon: Inocencio Soyla Lunger, MD;  Location: [facility] INVASIVE CV LAB;  Service: Cardiovascular;  Laterality: N/A;   INGUINAL HERNIA REPAIR Right 04/03/2024   Procedure: REPAIR, HERNIA, INGUINAL, ADULT;  Surgeon: Ann Fine, MD;  Location[facility];  Service: General;  Laterality: Right;   INSERTION OF MESH Right 04/03/2024   Procedure: INSERTION OF MESH;  Surgeon: Ann Fine, MD;  Location: [facility] OR;  Service: General;  Laterality: Right;   INTRAOPERATIVE TRANSESOPHAGEAL ECHOCARDIOGRAM N/A 10/28/2013   Procedure: INTRAOPERATIVE TRANSESOPHAGEAL ECHOCARDIOGRAM;  Surgeon: Dallas KATHEE Jude, MD;  Location: [facility] OR;  Service: Open Heart Surgery;  Laterality: N/A;   PERIPHERAL VASCULAR CATHETERIZATION N/A 01/01/2015   Procedure: Aortic Arch Angiography;  Surgeon: Debby DELENA Sor, MD;  Location: [facility] INVASIVE CV LAB;  Service: Cardiovascular;  Laterality: N/A;    POLYPECTOMY  07/22/2019   Procedure: POLYPECTOMY;  Surgeon: Legrand Victory LITTIE DOUGLAS, MD;  Location: [facility];  Service: Gastroenterology;;   RIGHT/LEFT HEART CATH AND CORONARY ANGIOGRAPHY N/A 07/09/2023   Procedure: RIGHT/LEFT HEART CATH AND CORONARY ANGIOGRAPHY;  Surgeon: Cherrie Toribio SAUNDERS, MD;  Location: [facility] INVASIVE CV LAB;  Service: Cardiovascular;  Laterality: N/A;   SHOULDER ARTHROSCOPY W/ ROTATOR CUFF REPAIR Right 2012   STERNOTOMY     REDO   TEE WITHOUT CARDIOVERSION N/A 10/24/2013   Procedure: TRANSESOPHAGEAL ECHOCARDIOGRAM (TEE);  Surgeon: Leim VEAR Moose, MD;  Location: [facility] ENDOSCOPY;  Service: Cardiovascular;  Laterality: N/A;   THYROIDECTOMY  ~ 2005   TRANSTHORACIC ECHOCARDIOGRAM  03/2010   SHOWED MILD REDUCTION OF LV FUNCTION   Current Outpatient Medications  Medication Sig Dispense Refill   acetaminophen  (TYLENOL ) 500 MG tablet Take 2 tablets (1,000  mg total) by mouth every 6 (six) hours as needed.     amoxicillin (AMOXIL) 500 MG capsule SMARTSIG:4 Capsule(s) By Mouth     ARIPiprazole  (ABILIFY ) 5 MG tablet Take 1 tablet nightly 90 tablet 0   aspirin  EC 81 MG tablet Take 81 mg by mouth at bedtime.      atorvastatin  (LIPITOR) 40 MG tablet TAKE 1 TABLET BY MOUTH EVERY DAY (Patient taking differently: TAKE 1 TABLET BY MOUTH EVERY DAY) 90 tablet 0   carvedilol  (COREG ) 6.25 MG tablet TAKE 1 TABLET BY MOUTH TWICE A DAY WITH FOOD 180 tablet 3   clonazePAM  (KLONOPIN ) 1 MG tablet Take 0.5 mg by mouth daily as needed for anxiety.     docusate sodium  (COLACE) 100 MG capsule Take 100 mg by mouth 3 (three) times a week. (Patient taking differently: Take 100 mg by mouth 3 (three) times a week.)     doxazosin  (CARDURA ) 1 MG tablet TAKE 1 TABLET (1 MG TOTAL) BY MOUTH DAILY. FOR SBP GREATER THAN 140 90 tablet 0   ENTRESTO  97-103 MG TAKE 1 TABLET BY MOUTH TWICE A DAY 180 tablet 1   FARXIGA  10 MG TABS tablet TAKE 1 TABLET BY MOUTH EVERY DAY 30 tablet 11   furosemide  (LASIX ) 20 MG tablet TAKE 1  TABLET BY MOUTH 4 TIMES A WEEK. (Patient taking differently: Take 20 mg by mouth 3 (three) times a week. Monday, Thursday, and Saturday) 48 tablet 3   levothyroxine  (SYNTHROID ) 150 MCG tablet TAKE 1 TABLET BY MOUTH EVERY DAY 30 tablet 0   mexiletine (MEXITIL ) 150 MG capsule TAKE 2 CAPSULES BY MOUTH EVERY MORNING AND 2 CAPSULES BY MOUTH EVERY EVENING. 360 capsule 3   Multiple Vitamins-Minerals ([place]) CHEW Chew 1 tablet by mouth at bedtime.     omeprazole  (PRILOSEC) 20 MG capsule Take 20 mg by mouth daily as needed (Stomach).     ranolazine  (RANEXA ) 500 MG 12 hr tablet TAKE 1 TABLET BY MOUTH TWICE A DAY 180 tablet 1   sertraline  (ZOLOFT ) 50 MG tablet TAKE 1 TABLET BY MOUTH EVERY DAY 90 tablet 0   No current facility-administered medications for this visit.   Allergies:   Oxycodone , Iodinated contrast media, and Rifampin    Social History:  The patient  reports that he has never smoked. He has never used smokeless tobacco. He reports that he does not drink alcohol and does not use drugs.   Family History:  The patient's family history includes Heart disease in his father; Hypertension in his father and mother; Kidney Stones in his sister; Stroke in his father; Thyroid  cancer in his sister and sister.   ROS:  Please see the history of present illness.   All other systems are personally reviewed and negative.   Recent Labs: 06/26/2023: B Natriuretic Peptide 329.2 04/04/2024: ALT 12; BUN 21; Creatinine, Ser 1.16; Magnesium  1.9; Potassium 4.1; Sodium 141 04/09/2024: Hemoglobin 17.2; Platelets 205.0  Personally reviewed   Wt Readings from Last 3 Encounters:  04/21/24 88 kg (194 lb)  04/15/24 88 kg (194 lb)  04/09/24 90.4 kg (199 lb 4.8 oz)    There were no vitals taken for this visit.  Physical Exam General:  Walked into clinic No resp difficulty HEENT: normal Neck: supple. no JVD. Carotids 2+ bilat; no bruits. No lymphadenopathy or thryomegaly appreciated. Cor: PMI nondisplaced. Regular  rate & rhythm. No rubs, gallops or murmurs. Lungs: clear Abdomen: soft, nontender, nondistended. No hepatosplenomegaly. No bruits or masses. Good bowel sounds. Extremities: no cyanosis, clubbing,  rash, edema Neuro: alert & orientedx3, cranial nerves grossly intact. moves all 4 extremities w/o difficulty. Affect pleasant   Device interrogation (personally reviewed): No VT/AF. Volume ok.   ASSESSMENT AND PLAN:  1. Chronic Systolic HF  - NICM, s/p St Jude ICD - Echo (5/17): EF 25-30%, Mild MR, Mod LAE, RV mildly dilated, Mild RAE.  - Echo (12/21/16): EF 25-30%. IW AK RV mild-mod  - Echo (02/20/18): EF 30-35% AVR stable.  - Echo (2020): EF 25%  - CPX (09/2015) with moderate HF limitation.  - CPX (6/18) much improved. Peak VO2: 21.1 (73% predicted peak VO2) VE/VCO2 slope: 35 - CPX 10/24 Peak VO2: 13.8 (53% predicted peak VO2) VE/VCO2 slope:  35 Peak RER: 1.28 - Echo (9/21): EF 35-40% - Echo (2022): EF 35-40% RV ok  - Echo (11/23): EF 25-30% AVR stable - Echo 06/16/23 EF 20-25% - Improved NYHA II - Volume status ok - Continue Lasix  20 mg 3x/week + PRN - Continue Entresto  97/103 mg bid - Continue Coreg  6.25 mg bid (bradycardic at higher doses) - Continue Farxiga  10 mg daily. - Not on spiro due to hyperkalemia - Overall improved. NYHA II. - If symptoms worsen could consider Barostim  2. VT/NICM - Quiescent on mexilitene/Ranexa  - ICD interrogated personally  3. HTN - BP ok  4. Valvular heart disease - s/p bentall in 2001 and re-do bentall 2015.  - AVR stable on echo - Aware of SBE prophylaxis  5. Depression - Stable.   6. Pre-operative CV risk stratification for hernia surgery - EF is low but functional capacity ok - moderate risk for peri-op CV complications - He is ok to proceed  Signed, Harlene CHRISTELLA Gainer, FNP  04/23/2024 3:44 PM  [facility] [address] [facility] [place] [address] [phone]  (office) [phone] (fax)'

## 2024-04-24 ENCOUNTER — Encounter

## 2024-04-25 ENCOUNTER — Encounter (HOSPITAL_COMMUNITY)

## 2024-04-30 ENCOUNTER — Encounter

## 2024-05-01 NOTE — Progress Notes (Signed)
 No ICM remote transmission received for 04/30/2024 and next ICM transmission scheduled for 05/19/2024.

## 2024-05-03 ENCOUNTER — Other Ambulatory Visit: Payer: Self-pay | Admitting: Family Medicine

## 2024-05-09 ENCOUNTER — Telehealth (HOSPITAL_COMMUNITY): Payer: Self-pay

## 2024-05-09 NOTE — Telephone Encounter (Signed)
 Called to confirm/remind patient of their appointment at the [facility] on 05/12/24.   Appointment:   [] Confirmed  [x] Left mess   [] No answer/No voice mail  [] VM Full/unable to leave message  [] Phone not in service  And to bring in all medications and/or complete list.

## 2024-05-11 ENCOUNTER — Other Ambulatory Visit: Payer: Self-pay | Admitting: Family Medicine

## 2024-05-12 ENCOUNTER — Ambulatory Visit (HOSPITAL_COMMUNITY): Payer: Self-pay | Admitting: Family Medicine

## 2024-05-12 ENCOUNTER — Encounter (HOSPITAL_COMMUNITY): Payer: Self-pay

## 2024-05-12 ENCOUNTER — Ambulatory Visit (HOSPITAL_COMMUNITY)
Admission: RE | Admit: 2024-05-12 | Discharge: 2024-05-12 | Disposition: A | Discharge status: Home / Self Care | Source: Ambulatory Visit | Attending: Family Medicine | Admitting: Family Medicine

## 2024-05-12 VITALS — BP 114/82 | HR 68 | Wt 196.8 lb

## 2024-05-12 DIAGNOSIS — Z79890 Hormone replacement therapy (postmenopausal): Secondary | ICD-10-CM | POA: Diagnosis not present

## 2024-05-12 DIAGNOSIS — Z8774 Personal history of (corrected) congenital malformations of heart and circulatory system: Secondary | ICD-10-CM | POA: Insufficient documentation

## 2024-05-12 DIAGNOSIS — G4730 Sleep apnea, unspecified: Secondary | ICD-10-CM | POA: Insufficient documentation

## 2024-05-12 DIAGNOSIS — I110 Hypertensive heart disease with heart failure: Secondary | ICD-10-CM | POA: Insufficient documentation

## 2024-05-12 DIAGNOSIS — Z7984 Long term (current) use of oral hypoglycemic drugs: Secondary | ICD-10-CM | POA: Insufficient documentation

## 2024-05-12 DIAGNOSIS — R5383 Other fatigue: Secondary | ICD-10-CM | POA: Diagnosis not present

## 2024-05-12 DIAGNOSIS — I5022 Chronic systolic (congestive) heart failure: Secondary | ICD-10-CM | POA: Insufficient documentation

## 2024-05-12 DIAGNOSIS — Z95810 Presence of automatic (implantable) cardiac defibrillator: Secondary | ICD-10-CM | POA: Diagnosis not present

## 2024-05-12 DIAGNOSIS — F32A Depression, unspecified: Secondary | ICD-10-CM | POA: Diagnosis not present

## 2024-05-12 DIAGNOSIS — I428 Other cardiomyopathies: Secondary | ICD-10-CM | POA: Diagnosis not present

## 2024-05-12 DIAGNOSIS — I359 Nonrheumatic aortic valve disorder, unspecified: Secondary | ICD-10-CM

## 2024-05-12 DIAGNOSIS — I358 Other nonrheumatic aortic valve disorders: Secondary | ICD-10-CM | POA: Diagnosis not present

## 2024-05-12 DIAGNOSIS — Z79899 Other long term (current) drug therapy: Secondary | ICD-10-CM | POA: Diagnosis not present

## 2024-05-12 DIAGNOSIS — I472 Ventricular tachycardia, unspecified: Secondary | ICD-10-CM | POA: Diagnosis not present

## 2024-05-12 DIAGNOSIS — E890 Postprocedural hypothyroidism: Secondary | ICD-10-CM | POA: Diagnosis not present

## 2024-05-12 DIAGNOSIS — R400 Somnolence: Secondary | ICD-10-CM

## 2024-05-12 DIAGNOSIS — Z85850 Personal history of malignant neoplasm of thyroid: Secondary | ICD-10-CM | POA: Insufficient documentation

## 2024-05-12 DIAGNOSIS — I10 Essential (primary) hypertension: Secondary | ICD-10-CM | POA: Diagnosis not present

## 2024-05-12 LAB — BASIC METABOLIC PANEL WITH GFR
Anion gap: 10 (ref 5–15)
BUN: 16 mg/dL (ref 8–23)
CO2: 25 mmol/L (ref 22–32)
Calcium: 9.6 mg/dL (ref 8.9–10.3)
Chloride: 102 mmol/L (ref 98–111)
Creatinine, Ser: 1.15 mg/dL (ref 0.61–1.24)
GFR, Estimated: >60 mL/min (ref >60)
Glucose, Bld: 100 mg/dL — ABNORMAL HIGH (ref 70–99)
Potassium: 4.4 mmol/L (ref 3.5–5.1)
Sodium: 137 mmol/L (ref 135–145)

## 2024-05-12 NOTE — Patient Instructions (Signed)
 There has been no changes to your medications.  Labs done today, your results will be available in MyChart, we will contact you for abnormal readings.  Your physician has requested that you have an echocardiogram. Echocardiography is a painless test that uses sound waves to create images of your heart. It provides your doctor with information about the size and shape of your heart and how well your heart's chambers and valves are working. This procedure takes approximately one hour. There are no restrictions for this procedure. Please do NOT wear cologne, perfume, aftershave, or lotions (deodorant is allowed). Please arrive 15 minutes prior to your appointment time.  Please note: We ask at that you not bring children with you during ultrasound (echo/ vascular) testing. Due to room size and safety concerns, children are not allowed in the ultrasound rooms during exams. Our front office staff cannot provide observation of children in our lobby area while testing is being conducted. An adult accompanying a patient to their appointment will only be allowed in the ultrasound room at the discretion of the ultrasound technician under special circumstances. We apologize for any inconvenience.  You have been referred to pulmonology. They will call you to arrange your appointment.  Your physician recommends that you schedule a follow-up appointment in: 3 months ( January 2026) ** PLEASE CALL THE OFFICE IN NOVEMBER TO ARRANGE YOUR FOLLOW UP APPOINTMENT.**  If you have any questions or concerns before your next appointment please send us  a message through [place] or call our office at [phone].    TO LEAVE A MESSAGE FOR THE NURSE SELECT OPTION 2, PLEASE LEAVE A MESSAGE INCLUDING: YOUR NAME DATE OF BIRTH CALL BACK NUMBER REASON FOR CALL**this is important as we prioritize the call backs  YOU WILL RECEIVE A CALL BACK THE SAME DAY AS LONG AS YOU CALL BEFORE 4:00 PM  At the [facility],  you and your health needs are our priority. As part of our continuing mission to provide you with exceptional heart care, we have created designated Provider Care Teams. These Care Teams include your primary Cardiologist (physician) and Advanced Practice Providers (APPs- Physician Assistants and Nurse Practitioners) who all work together to provide you with the care you need, when you need it.   You may see any of the following providers on your designated Care Team at your next follow up: Dr Toribio Fuel Dr Ezra Shuck Dr. Ria Commander Dr. Morene Brownie Amy Lenetta, NP Caffie Shed, [place] [facility] [place], [place] Beckey Coe, NP [place] Lee, NP Ellouise Class, NP Tinnie Redman, PharmD Jaun Bash, PharmD   Please be sure to bring in all your medications bottles to every appointment.    Thank you for choosing [facility]

## 2024-05-12 NOTE — Addendum Note (Signed)
 Encounter addended by: Glena Harlene HERO, FNP on: 05/12/2024 2:44 PM  Actions taken: Visit diagnoses modified, Clinical Note Signed

## 2024-05-12 NOTE — Telephone Encounter (Signed)
 ERROR

## 2024-05-12 NOTE — Addendum Note (Signed)
 Encounter addended by: Glena Harlene HERO, FNP on: 05/12/2024 3:11 PM  Actions taken: Clinical Note Signed

## 2024-05-12 NOTE — Progress Notes (Addendum)
 [facility] Note  Date:  05/12/2024   ID:  Bradley JULIANNA Millicent Mickey., DOB 07/28/1956, MRN [id number]  Location: Home  Provider location: [place] [facility] Type of Visit: Established patient  PCP:  Micheal Wolm ORN, MD  Primary Cardiologist:  Vina Gull, MD HF Cardiologist: Dr. Cherrie    HPI: Bradley [name] is a 68 y.o. y/o male with a complicated past medical history including thyroid  cancer (s/p thyroidectomy), HTN, depression, and congenital AS s/p aortic vavulotomy at age 37 with subsequent Bentall procedure in 2001 with a pericardial tissue valve.  He had MSSA bacteremia and bacterial endocarditis in 08/2013 complicated by renal failure requiring dialysis for a short period of time.  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     Admitted 11/2014 with monomorphic VT. Cath with normal coronaries.  Echo EF 30-35% -> STJ dual chamber ICD implant  Echo 9/21 EF 35-40%  Echo 11/23 EF 25-30%. RV mildly down. AVR ok  Follow up 3/24, doing well NYHA II-III and volume stable on torsemide 20 mg 3x/week.   CPX 10/24 Resting HR: 58 Peak HR: 105   (68% age predicted max HR)  BP rest: 110/62 BP peak: 162/64  Peak VO2: 13.8 (53% predicted peak VO2)  VE/VCO2 slope:  35  Peak RER: 1.28  VE/MVV:  43%  O2pulse:  11   (73% predicted O2pulse)   R/LHC 11/24: high grade narrowing at right coronary button with adequate flow down RCA; low filling pressures with mild to moderately reduced CO at 2.2.  Echo 06/26/23  EF 20-25% RV moderately HK   S/p ICD change out 7/25  Admitted 9/25 with SBO. Initially managed medically, but symptoms persisted and with concern for incarceration, he ultimately underwent right inguinal repair. He remained compensated from a HF standpoint and he was discharged home, weight 198 lbs.  Today he  returns for HF follow up with his wife. Overall feeling fine. Breathing about the same, has SOB walking up stairs but otherwise no undue SOB. Denies palpitations, abnormal bleeding, CP, dizziness, edema, or PND/Orthopnea. Appetite ok. Weight at home 194 pounds. Taking all medications. Has daytime fatigue, wife says he has periods at night where he stops breathing. Daughter is pregnant, this will be their 1st grandbaby  Cardiac Studies:  [facility] 11/24: high grade narrowing at right coronary button with adequate flow down RCA; RA 2, PA 13/5 (8), PCWP 1, CO/CI (Fick) 4.7/2.2, PVR 1.2 WU, PAPi 4  Echo 06/26/23  EF 20-25%, RV moderately HK   Echo (11/23): EF 25-30%, RV mildly down, AVR ok  Echo (7/22): EF 35-40%   Echo (12/20): EF 30-35%   Echo (7/19): EF 30-35%, AVR stable.   Echo (5/18): EF 25-30%    CPX (01/01/17)  FEV1 3.40 (86%)        FEV1/FVC 83 (107%)        MVV 163 (108%) Resting HR: 72 Peak HR: 129   (80% age predicted max HR) BP rest: 118/70 BP peak: 174/84  Peak VO2: 21.1 (73% predicted peak VO2) VE/VCO2 slope:  35 OUES: 1.96 Peak RER:  1.22 Ventilatory Threshold: 16.6 (58% predicted or measured peak VO2) VE/MVV:  50% O2pulse:  15   (100% predicted O2pulse)   Past Medical History:  Diagnosis Date   Anxiety    Aortic stenosis    s/p Bentall with bioprosthetic AVR  02/2010; Last echo (9/11): Moderate LVH, EF 45-50%, AVR functioning appropriately, aortic valve mean gradient 21, diastolic dysfunction. Chest MRA (2/13): Mild to moderate dilatation at the sinus of Valsalva at 4.1 cm, mild dilatation ascending aorta distal to the tube graft at 3.9 cm, moderate dilatation of the innominate artery a 2.1 cm;     CHF (congestive heart failure) (HCC)    Depression    ESRD on dialysis (HCC)    a. 09/2013 felt to be related to gentamycin b. no longer requiring HD   Hx of cardiac cath    a. LHC in 02/2010: normal cors   Hx of echocardiogram 02/2014   Echo (8/15):  Mod LV, EF 35-40%, Gr 1  DD, AVR ok (mean 14 mmHg), mild LAE   Hypertension    Hypothyroidism, postsurgical    Prosthetic valve endocarditis    Staphylococcus aureus bacteremia    Thyroid  cancer (HCC)    Ventricular tachycardia (HCC)    a. s/p STJ ICD   Past Surgical History:  Procedure Laterality Date   AORTIC VALVE REPAIR  1968   AORTIC VALVE REPLACEMENT  2011   AV FISTULA PLACEMENT Left 10/08/2013   Procedure: ARTERIOVENOUS (AV) FISTULA CREATION- LEFT ARM; Radial Cephalic ;  Surgeon: Lynwood JONETTA Collum, MD;  Location: [facility] OR;  Service: Vascular;  Laterality: Left;   BENTALL PROCEDURE N/A 10/28/2013   Procedure: REDO BENTALL PROCEDURE, debridment of aoritc root abscess, replacement of aortic root, ascending aorta and aortic valve with homograft. Insertion of left femoral arterial line;  Surgeon: Dallas KATHEE Jude, MD;  Location: [facility] OR;  Service: Open Heart Surgery;  Laterality: N/A;   BIOPSY  07/22/2019   Procedure: BIOPSY;  Surgeon: Legrand Victory LITTIE DOUGLAS, MD;  Location: [facility];  Service: Gastroenterology;;   CARDIAC CATHETERIZATION  03/02/2010   NORMAL CORONARY ARTERY   CARDIAC CATHETERIZATION N/A 01/01/2015   Procedure: Coronary/Graft Angiography;  Surgeon: Debby DELENA Sor, MD;  Location: [facility] INVASIVE CV LAB;  Service: Cardiovascular;  Laterality: N/A;   CARDIAC VALVE REPLACEMENT     COLONOSCOPY WITH PROPOFOL  N/A 07/22/2019   Procedure: COLONOSCOPY WITH PROPOFOL ;  Surgeon: Legrand Victory LITTIE DOUGLAS, MD;  Location: [facility];  Service: Gastroenterology;  Laterality: N/A;   EP IMPLANTABLE DEVICE N/A 01/01/2015   Procedure: ICD Implant;  Surgeon: Elspeth JAYSON Sage, MD;  Location: [facility] INVASIVE CV LAB;  Service: Cardiovascular;  Laterality: N/A;   HEMOSTASIS CLIP PLACEMENT  07/22/2019   Procedure: HEMOSTASIS CLIP PLACEMENT;  Surgeon: Legrand Victory LITTIE DOUGLAS, MD;  Location: [facility];  Service: Gastroenterology;;   ICD GENERATOR CHANGEOUT N/A 02/11/2024   Procedure: ICD GENERATOR CHANGEOUT;  Surgeon: Inocencio Soyla Lunger, MD;   Location: [facility] INVASIVE CV LAB;  Service: Cardiovascular;  Laterality: N/A;   INGUINAL HERNIA REPAIR Right 04/03/2024   Procedure: REPAIR, HERNIA, INGUINAL, ADULT;  Surgeon: Ann Fine, MD;  Location: [facility] OR;  Service: General;  Laterality: Right;   INSERTION OF MESH Right 04/03/2024   Procedure: INSERTION OF MESH;  Surgeon: Ann Fine, MD;  Location: [facility] OR;  Service: General;  Laterality: Right;   INTRAOPERATIVE TRANSESOPHAGEAL ECHOCARDIOGRAM N/A 10/28/2013   Procedure: INTRAOPERATIVE TRANSESOPHAGEAL ECHOCARDIOGRAM;  Surgeon: Dallas KATHEE Jude, MD;  Location: [facility] OR;  Service: Open Heart Surgery;  Laterality: N/A;   PERIPHERAL VASCULAR CATHETERIZATION N/A 01/01/2015   Procedure: Aortic Arch Angiography;  Surgeon: Debby DELENA Sor, MD;  Location: [facility] INVASIVE CV LAB;  Service: Cardiovascular;  Laterality: N/A;   POLYPECTOMY  07/22/2019   Procedure: POLYPECTOMY;  Surgeon: Legrand,  Victory LITTIE MOULD, MD;  Location: [facility];  Service: Gastroenterology;;   RIGHT/LEFT HEART CATH AND CORONARY ANGIOGRAPHY N/A 07/09/2023   Procedure: RIGHT/LEFT HEART CATH AND CORONARY ANGIOGRAPHY;  Surgeon: Cherrie Toribio SAUNDERS, MD;  Location: [facility] INVASIVE CV LAB;  Service: Cardiovascular;  Laterality: N/A;   SHOULDER ARTHROSCOPY W/ ROTATOR CUFF REPAIR Right 2012   STERNOTOMY     REDO   TEE WITHOUT CARDIOVERSION N/A 10/24/2013   Procedure: TRANSESOPHAGEAL ECHOCARDIOGRAM (TEE);  Surgeon: Leim VEAR Moose, MD;  Location: [facility] ENDOSCOPY;  Service: Cardiovascular;  Laterality: N/A;   THYROIDECTOMY  ~ 2005   TRANSTHORACIC ECHOCARDIOGRAM  03/2010   SHOWED MILD REDUCTION OF LV FUNCTION   Current Outpatient Medications  Medication Sig Dispense Refill   acetaminophen  (TYLENOL ) 500 MG tablet Take 2 tablets (1,000 mg total) by mouth every 6 (six) hours as needed.     ARIPiprazole  (ABILIFY ) 5 MG tablet Take 1 tablet nightly 90 tablet 0   aspirin  EC 81 MG tablet Take 81 mg by mouth at bedtime.      atorvastatin  (LIPITOR) 40 MG tablet  TAKE 1 TABLET BY MOUTH EVERY DAY 90 tablet 0   carvedilol  (COREG ) 6.25 MG tablet TAKE 1 TABLET BY MOUTH TWICE A DAY WITH FOOD 180 tablet 3   clonazePAM  (KLONOPIN ) 1 MG tablet Take 0.5 mg by mouth daily as needed for anxiety.     docusate sodium  (COLACE) 100 MG capsule Take 100 mg by mouth 3 (three) times a week.     doxazosin  (CARDURA ) 1 MG tablet TAKE 1 TABLET (1 MG TOTAL) BY MOUTH DAILY. FOR SBP GREATER THAN 140 90 tablet 0   ENTRESTO  97-103 MG TAKE 1 TABLET BY MOUTH TWICE A DAY 180 tablet 1   FARXIGA  10 MG TABS tablet TAKE 1 TABLET BY MOUTH EVERY DAY 30 tablet 11   furosemide  (LASIX ) 20 MG tablet TAKE 1 TABLET BY MOUTH 4 TIMES A WEEK. 48 tablet 3   levothyroxine  (SYNTHROID ) 150 MCG tablet TAKE 1 TABLET BY MOUTH EVERY DAY 30 tablet 0   mexiletine (MEXITIL ) 150 MG capsule TAKE 2 CAPSULES BY MOUTH EVERY MORNING AND 2 CAPSULES BY MOUTH EVERY EVENING. 360 capsule 3   Multiple Vitamins-Minerals (AIRBORNE) CHEW Chew 1 tablet by mouth at bedtime.     omeprazole  (PRILOSEC) 20 MG capsule Take 20 mg by mouth daily as needed (Stomach).     ranolazine  (RANEXA ) 500 MG 12 hr tablet TAKE 1 TABLET BY MOUTH TWICE A DAY 180 tablet 1   sertraline  (ZOLOFT ) 50 MG tablet TAKE 1 TABLET BY MOUTH EVERY DAY 90 tablet 0   amoxicillin (AMOXIL) 500 MG capsule SMARTSIG:4 Capsule(s) By Mouth     No current facility-administered medications for this encounter.   Allergies:   Oxycodone , Iodinated contrast media, and Rifampin    Social History:  The patient  reports that he has never smoked. He has never used smokeless tobacco. He reports that he does not drink alcohol and does not use drugs.   Family History:  The patient's family history includes Heart disease in his father; Hypertension in his father and mother; Kidney Stones in his sister; Stroke in his father; Thyroid  cancer in his sister and sister.   ROS:  Please see the history of present illness.   All other systems are personally reviewed and negative.   Recent  Labs: 06/26/2023: B Natriuretic Peptide 329.2 04/04/2024: ALT 12; BUN 21; Creatinine, Ser 1.16; Magnesium  1.9; Potassium 4.1; Sodium 141 04/09/2024: Hemoglobin 17.2; Platelets 205.0  Personally reviewed  Wt Readings from Last 3 Encounters:  05/12/24 89.3 kg (196 lb 12.8 oz)  04/21/24 88 kg (194 lb)  04/15/24 88 kg (194 lb)    BP 114/82   Pulse 68   Wt 89.3 kg (196 lb 12.8 oz)   SpO2 97%   BMI 25.96 kg/m   Physical Exam General:  NAD. No resp difficulty, walked into clinic HEENT: Normal Neck: Supple. No JVD. Cor: Regular rate & rhythm. No rubs, gallops or murmurs. Lungs: Clear Abdomen: Soft, nontender, nondistended.  Extremities: No cyanosis, clubbing, rash, edema Neuro: Alert & oriented x 3, moves all 4 extremities w/o difficulty. Affect pleasant.  Device interrogation (personally reviewed): CorVue stable, <1% VP, 2 short bursts of NSVT  ASSESSMENT AND PLAN: 1. Chronic Systolic HF  - NICM, s/p St Jude ICD - Echo (5/17): EF 25-30%, Mild MR, Mod LAE, RV mildly dilated, Mild RAE.  - Echo (5/18): EF 25-30%. IW AK RV mild-mod  - Echo (7/19): EF 30-35%, AVR stable.  - Echo (2020): EF 25%  - CPX (3/17) with moderate HF limitation.  - CPX (6/18) much improved. Peak VO2: 21.1 (73% predicted peak VO2) VE/VCO2 slope: 35 - CPX 10/24 Peak VO2: 13.8 (53% predicted peak VO2) VE/VCO2 slope:  35 Peak RER: 1.28 - Echo (9/21): EF 35-40% - Echo (2022): EF 35-40%, RV ok  - Echo (11/23): EF 25-30%, AVR stable - Echo (11/24): EF 20-25% - R/LHC 11/24: high grade narrowing at right coronary button; low filling pressures and low CO at 2.2 - Improved NYHA II, volume OK on exam and device interrogation - Continue Lasix  20 mg 3x/week + PRN - Continue Entresto  97/103 mg bid. - Continue Coreg  6.25 mg bid (bradycardic at higher doses) - Continue Farxiga  10 mg daily. - Not on spiro due to hyperkalemia. - If symptoms worsen, could consider Barostim. - Labs today. - Update echo next visit  2.  VT/NICM - Quiescent. - on mexilitene 300 mg bid + Ranexa  500 mg bid - ICD interrogation as above  3. HTN - BP well controlled - Meds as above  4. Valvular heart disease - s/p bentall in 2001 and re-do bentall 2015.  - AVR stable on recent echo - Aware of SBE prophylaxis - Update echo  5. Depression - Stable.  - Management per PCP  6. Daytime fatigue - wife notices nighttime apneic events - suspect OSA - Refer for sleep study  Follow up in 4 months with Dr. Cherrie + echo  Signed, Harlene CHRISTELLA Gainer, FNP  05/12/2024 2:05 PM  [facility] [address] [facility] [place] [address] [phone] (office) [phone] (fax)'

## 2024-05-16 ENCOUNTER — Ambulatory Visit: Attending: Cardiology | Admitting: Cardiology

## 2024-05-16 ENCOUNTER — Encounter: Payer: Self-pay | Admitting: Cardiology

## 2024-05-16 VITALS — BP 134/86 | HR 62 | Ht 73.0 in | Wt 192.0 lb

## 2024-05-16 DIAGNOSIS — I5022 Chronic systolic (congestive) heart failure: Secondary | ICD-10-CM | POA: Diagnosis not present

## 2024-05-16 DIAGNOSIS — I472 Ventricular tachycardia, unspecified: Secondary | ICD-10-CM

## 2024-05-16 DIAGNOSIS — Z95810 Presence of automatic (implantable) cardiac defibrillator: Secondary | ICD-10-CM | POA: Diagnosis not present

## 2024-05-16 DIAGNOSIS — I428 Other cardiomyopathies: Secondary | ICD-10-CM | POA: Diagnosis not present

## 2024-05-16 LAB — CUP PACEART INCLINIC DEVICE CHECK
Battery Remaining Longevity: 118 mo
Brady Statistic RV Percent Paced: 0.04 %
Date Time Interrogation Session: 20251017160432
HighPow Impedance: 66.375
Implantable Lead Connection Status: 753985
Implantable Lead Implant Date: 20160603
Implantable Lead Location: 753860
Implantable Lead Model: 7122
Implantable Pulse Generator Implant Date: 20250714
Lead Channel Impedance Value: 425 Ohm
Lead Channel Pacing Threshold Amplitude: 1.25 V
Lead Channel Pacing Threshold Amplitude: 1.25 V
Lead Channel Pacing Threshold Pulse Width: 1 ms
Lead Channel Pacing Threshold Pulse Width: 1 ms
Lead Channel Sensing Intrinsic Amplitude: 8.1 mV
Lead Channel Setting Pacing Amplitude: 3 V
Lead Channel Setting Pacing Pulse Width: 1 ms
Lead Channel Setting Sensing Sensitivity: 0.5 mV
Pulse Gen Serial Number: 111074227

## 2024-05-16 NOTE — Patient Instructions (Signed)

## 2024-05-16 NOTE — Progress Notes (Signed)
  Electrophysiology Office Note:   Date:  05/16/2024  ID:  Bradley JULIANNA Millicent Mickey., DOB [birth year], MRN [id number]  Primary Cardiologist: Vina Gull, MD Primary Heart Failure: Toribio Fuel, MD Electrophysiologist: Soyla Gladis Norton, MD      History of Present Illness:   Bradley Teater. is a 68 y.o. male with h/o chronic systolic heart failure due to nonischemic cardiomyopathy, ventricular tachycardia, congenital aortic stenosis post balloon valvulotomy at age 38 and repair in 1968, hypertension, CKD stage III seen today for routine electrophysiology follow-up s/p ICD generator change.  Since last being seen in our clinic the patient reports doing well.  He has no chest pain and no shortness of breath.  He is able to do his daily activities without restriction.  He is unaware of any arrhythmias.  he denies chest pain, palpitations, dyspnea, PND, orthopnea, nausea, vomiting, dizziness, syncope, edema, weight gain, or early satiety.    Review of systems complete and found to be negative unless listed in HPI.      EP Information / Studies Reviewed:    EKG is ordered today. Personal review as below.      ICD Interrogation-  reviewed in detail today,  See PACEART report.  Device History: Abbott Single Chamber ICD implanted 2016, generator change 02/11/2024 for chronic systolic heart failure History of appropriate therapy: Yes History of AAD therapy: Yes; currently on mexiletine, ranolazine    Risk Assessment/Calculations:            Physical Exam:   VS:  BP 134/86 (BP Location: Left Arm, Patient Position: Sitting, Cuff Size: Large)   Pulse 62   Ht 6' 1 (1.854 m)   Wt 192 lb (87.1 kg)   SpO2 97%   BMI 25.33 kg/m    Wt Readings from Last 3 Encounters:  05/16/24 192 lb (87.1 kg)  05/12/24 196 lb 12.8 oz (89.3 kg)  04/21/24 194 lb (88 kg)     GEN: Well nourished, well developed in no acute distress NECK: No JVD; No carotid bruits CARDIAC: Regular rate and rhythm, no  murmurs, rubs, gallops RESPIRATORY:  Clear to auscultation without rales, wheezing or rhonchi  ABDOMEN: Soft, non-tender, non-distended EXTREMITIES:  No edema; No deformity   ASSESSMENT AND PLAN:    Chronic systolic dysfunction s/p Abbott single chamber ICD  euvolemic today Stable on an appropriate medical regimen Normal ICD function See Pace Art report No changes today  2.  Ventricular tachycardia: Nonsustained VT noted on monitoring.  Continue current management.  On mexiletine and Ranexa .    3.  Congenital aortic stenosis: Post Bentall in 2015.  Plan per primary cardiology.  4.  Hypertension: Well-controlled.  Disposition:   Follow up with EP Team in 12 months   Signed, Makeila Yamaguchi Gladis Norton, MD

## 2024-05-19 ENCOUNTER — Ambulatory Visit: Attending: Cardiology

## 2024-05-19 ENCOUNTER — Ambulatory Visit: Payer: Self-pay | Admitting: Cardiology

## 2024-05-19 DIAGNOSIS — I5022 Chronic systolic (congestive) heart failure: Secondary | ICD-10-CM

## 2024-05-19 DIAGNOSIS — Z95810 Presence of automatic (implantable) cardiac defibrillator: Secondary | ICD-10-CM

## 2024-05-20 NOTE — Progress Notes (Addendum)
 EPIC Encounter for ICM Monitoring  Patient Name: Bradley [name]. is a 68 y.o. male Date: 05/20/2024 Primary Care Physican: Micheal Wolm ORN, MD Primary Cardiologist: Bensimhon Electrophysiologist: Inocencio Nephrologist:  Deterding 08/23/2022 Weight: 198.2 lbs 09/26/2023 Office Weight: 193 lbs 11/30/2023 Office Weight: 196 lbs   05/16/2024 Office Weight:  192 lbs                                      Transmission results reviewed.     CorVue thoracic impedance suggesting possible normal fluid levels in the last month.   Prescribed:  Furosemide  20 mg Take 1 tablet (20 mg total) by mouth 4 (four) times a week.  Per med list, patient takes 1 tablet every Monday, Thursday and Saturday.      Labs: 01/29/2024 Creatinine 1.20, BUN 22, Potassium 5.1, Sodium 144, GFR 67  A complete set of results can be found in Results Review.   Recommendations:  No changes.   Follow-up plan: ICM clinic phone appointment on 06/19/2024.   91 day device clinic remote transmission 06/25/2024.     EP/Cardiology Office Visits:  Recall 05/11/2025 with EP APP.  Recall 08/11/2024 with Dr Cherrie.     Copy of ICM check sent to Dr. Inocencio.  Remote monitoring is medically necessary for Heart Failure Management.    Daily Thoracic Impedance ICM trend: 02/19/2024 through 05/19/2024.    12-14 Month Thoracic Impedance ICM trend:     Mitzie GORMAN Garner, RN 05/20/2024 3:33 PM

## 2024-05-25 ENCOUNTER — Other Ambulatory Visit: Payer: Self-pay | Admitting: Family Medicine

## 2024-06-08 ENCOUNTER — Other Ambulatory Visit: Payer: Self-pay | Admitting: Family Medicine

## 2024-06-09 ENCOUNTER — Other Ambulatory Visit: Payer: Self-pay | Admitting: Family Medicine

## 2024-06-16 ENCOUNTER — Ambulatory Visit

## 2024-06-16 VITALS — Ht 73.0 in | Wt 192.0 lb

## 2024-06-16 DIAGNOSIS — Z0000 Encounter for general adult medical examination without abnormal findings: Secondary | ICD-10-CM | POA: Diagnosis not present

## 2024-06-16 NOTE — Patient Instructions (Addendum)
 Bradley [name],  Thank you for taking the time for your Medicare Wellness Visit. I appreciate your continued commitment to your health goals. Please review the care plan we discussed, and feel free to reach out if I can assist you further.  Please note that Annual Wellness Visits do not include a physical exam. Some assessments may be limited, especially if the visit was conducted virtually. If needed, we may recommend an in-person follow-up with your provider.  Ongoing Care Seeing your primary care provider every 3 to 6 months helps us  monitor your health and provide consistent, personalized care. Last office visit on 04/09/2024.  You are due for a flu vaccine and a shingrix vaccine.  These can be given to you at your pharmacy.  Remember to discuss a colonoscopy with provider during your next office visit.    Referrals If a referral was made during today's visit and you haven't received any updates within two weeks, please contact the referred provider directly to check on the status.  Recommended Screenings:  Health Maintenance  Topic Date Due   Zoster (Shingles) Vaccine (1 of 2) Never done   Colon Cancer Screening  07/21/2022   Flu Shot  02/29/2024   COVID-19 Vaccine (5 - 2025-26 season) 03/31/2024   Medicare Annual Wellness Visit  06/16/2025   DTaP/Tdap/Td vaccine (3 - Td or Tdap) 03/06/2026   Pneumococcal Vaccine for age over 22  Completed   Hepatitis C Screening  Completed   Meningitis B Vaccine  Aged Out       06/16/2024    2:26 PM  Advanced Directives  Does Patient Have a Medical Advance Directive? Yes  Type of Estate Agent of [place];Living will    Vision: Annual vision screenings are recommended for early detection of glaucoma, cataracts, and diabetic retinopathy. These exams can also reveal signs of chronic conditions such as diabetes and high blood pressure.  Dental: Annual dental screenings help detect early signs of oral cancer, gum disease, and  other conditions linked to overall health, including heart disease and diabetes.  Please see the attached documents for additional preventive care recommendations.

## 2024-06-16 NOTE — Progress Notes (Signed)
 Chief Complaint  Patient presents with   Medicare Wellness     Subjective:   Bradley [name]. is a 68 y.o. male who presents for a Medicare Annual Wellness Visit.  Allergies (verified) Oxycodone , Iodinated contrast media, and Rifampin    History: Past Medical History:  Diagnosis Date   Anxiety    Aortic stenosis    s/p Bentall with bioprosthetic AVR 02/2010; Last echo (9/11): Moderate LVH, EF 45-50%, AVR functioning appropriately, aortic valve mean gradient 21, diastolic dysfunction. Chest MRA (2/13): Mild to moderate dilatation at the sinus of Valsalva at 4.1 cm, mild dilatation ascending aorta distal to the tube graft at 3.9 cm, moderate dilatation of the innominate artery a 2.1 cm;     CHF (congestive heart failure) (HCC)    Depression    ESRD on dialysis (HCC)    a. 09/2013 felt to be related to gentamycin b. no longer requiring HD   Hx of cardiac cath    a. LHC in 02/2010: normal cors   Hx of echocardiogram 02/2014   Echo (8/15):  Mod LV, EF 35-40%, Gr 1 DD, AVR ok (mean 14 mmHg), mild LAE   Hypertension    Hypothyroidism, postsurgical    Prosthetic valve endocarditis    Staphylococcus aureus bacteremia    Thyroid  cancer (HCC)    Ventricular tachycardia (HCC)    a. s/p STJ ICD   Past Surgical History:  Procedure Laterality Date   AORTIC VALVE REPAIR  07/31/1966   AORTIC VALVE REPLACEMENT  07/31/2009   AV FISTULA PLACEMENT Left 10/08/2013   Procedure: ARTERIOVENOUS (AV) FISTULA CREATION- LEFT ARM; Radial Cephalic ;  Surgeon: Lynwood JONETTA Collum, MD;  Location: [facility] OR;  Service: Vascular;  Laterality: Left;   BENTALL PROCEDURE N/A 10/28/2013   Procedure: REDO BENTALL PROCEDURE, debridment of aoritc root abscess, replacement of aortic root, ascending aorta and aortic valve with homograft. Insertion of left femoral arterial line;  Surgeon: Dallas KATHEE Jude, MD;  Location: [facility] OR;  Service: Open Heart Surgery;  Laterality: N/A;   BIOPSY  07/22/2019   Procedure: BIOPSY;   Surgeon: Legrand Victory LITTIE DOUGLAS, MD;  Location: [facility];  Service: Gastroenterology;;   CARDIAC CATHETERIZATION  03/02/2010   NORMAL CORONARY ARTERY   CARDIAC CATHETERIZATION N/A 01/01/2015   Procedure: Coronary/Graft Angiography;  Surgeon: Debby DELENA Sor, MD;  Location: [facility] INVASIVE CV LAB;  Service: Cardiovascular;  Laterality: N/A;   CARDIAC VALVE REPLACEMENT     COLONOSCOPY WITH PROPOFOL  N/A 07/22/2019   Procedure: COLONOSCOPY WITH PROPOFOL ;  Surgeon: Legrand Victory LITTIE DOUGLAS, MD;  Location: [facility];  Service: Gastroenterology;  Laterality: N/A;   EP IMPLANTABLE DEVICE N/A 01/01/2015   Procedure: ICD Implant;  Surgeon: Elspeth JAYSON Sage, MD;  Location: [facility] INVASIVE CV LAB;  Service: Cardiovascular;  Laterality: N/A;   FETAL SURGERY FOR CONGENITAL HERNIA  04/03/2024   HEMOSTASIS CLIP PLACEMENT  07/22/2019   Procedure: HEMOSTASIS CLIP PLACEMENT;  Surgeon: Legrand Victory LITTIE DOUGLAS, MD;  Location: [facility];  Service: Gastroenterology;;   ICD GENERATOR CHANGEOUT N/A 02/11/2024   Procedure: ICD GENERATOR CHANGEOUT;  Surgeon: Inocencio Soyla Lunger, MD;  Location: [facility] INVASIVE CV LAB;  Service: Cardiovascular;  Laterality: N/A;   INGUINAL HERNIA REPAIR Right 04/03/2024   Procedure: REPAIR, HERNIA, INGUINAL, ADULT;  Surgeon: Ann Fine, MD;  Location[facility] OR;  Service: General;  Laterality: Right;   INSERTION OF MESH Right 04/03/2024   Procedure: INSERTION OF MESH;  Surgeon: Ann Fine, MD;  Location: [facility] OR;  Service:  General;  Laterality: Right;   INTRAOPERATIVE TRANSESOPHAGEAL ECHOCARDIOGRAM N/A 10/28/2013   Procedure: INTRAOPERATIVE TRANSESOPHAGEAL ECHOCARDIOGRAM;  Surgeon: Dallas KATHEE Jude, MD;  Location: [facility] OR;  Service: Open Heart Surgery;  Laterality: N/A;   PERIPHERAL VASCULAR CATHETERIZATION N/A 01/01/2015   Procedure: Aortic Arch Angiography;  Surgeon: Debby DELENA Sor, MD;  Location: [facility] INVASIVE CV LAB;  Service: Cardiovascular;  Laterality: N/A;   POLYPECTOMY  07/22/2019   Procedure:  POLYPECTOMY;  Surgeon: Legrand Victory LITTIE DOUGLAS, MD;  Location: [facility];  Service: Gastroenterology;;   RIGHT/LEFT HEART CATH AND CORONARY ANGIOGRAPHY N/A 07/09/2023   Procedure: RIGHT/LEFT HEART CATH AND CORONARY ANGIOGRAPHY;  Surgeon: Cherrie Toribio SAUNDERS, MD;  Location: [facility] INVASIVE CV LAB;  Service: Cardiovascular;  Laterality: N/A;   SHOULDER ARTHROSCOPY W/ ROTATOR CUFF REPAIR Right 07/31/2010   STERNOTOMY     REDO   TEE WITHOUT CARDIOVERSION N/A 10/24/2013   Procedure: TRANSESOPHAGEAL ECHOCARDIOGRAM (TEE);  Surgeon: Leim VEAR Moose, MD;  Location: [facility] ENDOSCOPY;  Service: Cardiovascular;  Laterality: N/A;   THYROIDECTOMY  ~ 2005   TRANSTHORACIC ECHOCARDIOGRAM  03/31/2010   SHOWED MILD REDUCTION OF LV FUNCTION   Family History  Problem Relation Age of Onset   Hypertension Mother    Heart disease Father    Stroke Father    Hypertension Father    Thyroid  cancer Sister    Thyroid  cancer Sister    Kidney Stones Sister    Heart attack Neg Hx    Colon cancer Neg Hx    Colon polyps Neg Hx    Esophageal cancer Neg Hx    Stomach cancer Neg Hx    Rectal cancer Neg Hx    Social History   Occupational History   Occupation: RETIRED  Tobacco Use   Smoking status: Never   Smokeless tobacco: Never  Vaping Use   Vaping status: Never Used  Substance and Sexual Activity   Alcohol use: No    Alcohol/week: 0.0 standard drinks of alcohol    Comment: Occasional   Drug use: No   Sexual activity: Never   Tobacco Counseling Counseling given: Not Answered  SDOH Screenings   Food Insecurity: No Food Insecurity (04/07/2024)  Housing: Unknown (04/07/2024)  Transportation Needs: No Transportation Needs (04/07/2024)  Utilities: Not At Risk (04/07/2024)  Alcohol Screen: Low Risk  (10/17/2021)  Depression (PHQ2-9): High Risk (03/12/2023)  Financial Resource Strain: Low Risk  (04/05/2022)  Physical Activity: Inactive (10/17/2021)  Social Connections: Moderately Isolated (04/02/2024)  Stress: No Stress  Concern Present (10/17/2021)  Tobacco Use: Low Risk  (06/16/2024)   See flowsheets for full screening details  Depression Screen     Goals Addressed               This Visit's Progress     Increase physical activity (pt-stated)   On track     Walk more and diet changes.       Visit info / Clinical Intake: Medicare Wellness Visit Type:: Subsequent Annual Wellness Visit Persons participating in visit:: patient Medicare Wellness Visit Mode:: Video Because this visit was a virtual/telehealth visit:: vitals recorded from last visit If Telephone or Video please confirm:: I connected with the patient using audio enabled telemedicine application and verified that I am speaking with the correct person using two identifiers; I discussed the limitations of evaluation and management by telemedicine; The patient expressed understanding and agreed to proceed Patient Location:: Home Provider Location:: Home Information given by:: patient Interpreter Needed?: No Pre-visit prep was completed: no AWV questionnaire completed  by patient prior to visit?: no Living arrangements:: lives with spouse/significant other Patient's Overall Health Status Rating: (!) fair Typical amount of pain: none (sometimes legs hurt) Does pain affect daily life?: no (when walking) Are you currently prescribed opioids?: no  Dietary Habits and Nutritional Risks How many meals a day?: 3 (including a snack) Eats fruit and vegetables daily?: (!) no Most meals are obtained by: preparing own meals In the last 2 weeks, have you had any of the following?: none Diabetic:: no  Functional Status Activities of Daily Living (to include ambulation/medication): Independent Ambulation: Independent with device- listed below Home Assistive Devices/Equipment: Cane; Eyeglasses (uses a cane sometimes) Medication Administration: Independent Home Management: Independent Manage your own finances?: (!) no (his wife handles  it) Primary transportation is: driving (per pt-drives sometimes) Concerns about vision?: no *vision screening is required for WTM* Concerns about hearing?: no  Fall Screening Falls in the past year?: 1 Number of falls in past year: 1 Was there an injury with Fall?: 0 Fall Risk Category Calculator: 2 Patient Fall Risk Level: Moderate Fall Risk  Fall Risk Patient at Risk for Falls Due to: Impaired balance/gait Fall risk Follow up: Falls evaluation completed; Falls prevention discussed  Home and Transportation Safety: All rugs have non-skid backing?: yes All stairs or steps have railings?: yes (deck outside) Grab bars in the bathtub or shower?: yes Have non-skid surface in bathtub or shower?: yes Good home lighting?: yes Regular seat belt use?: yes Hospital stays in the last year:: no  Cognitive Assessment Difficulty concentrating, remembering, or making decisions? : yes Will 6CIT or Mini Cog be Completed: yes What year is it?: 0 points What month is it?: 0 points Give patient an address phrase to remember (5 components): [address], [place] About what time is it?: 0 points  Advance Directives (For Healthcare) Does Patient Have a Medical Advance Directive?: Yes Does patient want to make changes to medical advance directive?: No - Patient declined Type of Advance Directive: Healthcare Power of [place]; Living will Copy of Healthcare Power of Attorney in Chart?: No - copy requested        Objective:    Today's Vitals   06/16/24 1420  Weight: 192 lb (87.1 kg)  Height: 6' 1 (1.854 m)   Body mass index is 25.33 kg/m.  Current Medications (verified) Outpatient Encounter Medications as of 06/16/2024  Medication Sig   acetaminophen  (TYLENOL ) 500 MG tablet Take 2 tablets (1,000 mg total) by mouth every 6 (six) hours as needed.   amoxicillin (AMOXIL) 500 MG capsule SMARTSIG:4 Capsule(s) By Mouth (Patient taking differently: Take 500 mg by mouth as directed. Take 4  capsules prior to dental procedures)   ARIPiprazole  (ABILIFY ) 5 MG tablet TAKE 1 TABLET BY MOUTH EVERY DAY AT NIGHT   aspirin  EC 81 MG tablet Take 81 mg by mouth at bedtime.    atorvastatin  (LIPITOR) 40 MG tablet TAKE 1 TABLET BY MOUTH EVERY DAY   carvedilol  (COREG ) 6.25 MG tablet TAKE 1 TABLET BY MOUTH TWICE A DAY WITH FOOD   clonazePAM  (KLONOPIN ) 1 MG tablet Take 0.5 mg by mouth daily as needed for anxiety.   docusate sodium  (COLACE) 100 MG capsule Take 100 mg by mouth 3 (three) times a week.   doxazosin  (CARDURA ) 1 MG tablet TAKE 1 TABLET (1 MG TOTAL) BY MOUTH DAILY. FOR SBP GREATER THAN 140   ENTRESTO  97-103 MG TAKE 1 TABLET BY MOUTH TWICE A DAY   FARXIGA  10 MG TABS tablet TAKE 1  TABLET BY MOUTH EVERY DAY   furosemide  (LASIX ) 20 MG tablet TAKE 1 TABLET BY MOUTH 4 TIMES A WEEK.   mexiletine (MEXITIL ) 150 MG capsule TAKE 2 CAPSULES BY MOUTH EVERY MORNING AND 2 CAPSULES BY MOUTH EVERY EVENING.   Multiple Vitamins-Minerals (AIRBORNE) CHEW Chew 1 tablet by mouth at bedtime.   omeprazole  (PRILOSEC) 20 MG capsule Take 20 mg by mouth daily as needed (Stomach).   ranolazine  (RANEXA ) 500 MG 12 hr tablet TAKE 1 TABLET BY MOUTH TWICE A DAY   sertraline  (ZOLOFT ) 50 MG tablet TAKE 1 TABLET BY MOUTH EVERY DAY   SYNTHROID  150 MCG tablet TAKE 1 TABLET BY MOUTH EVERY DAY   No facility-administered encounter medications on file as of 06/16/2024.   Hearing/Vision screen No results found. Immunizations and Health Maintenance Health Maintenance  Topic Date Due   Zoster Vaccines- Shingrix (1 of 2) Never done   Colonoscopy  07/21/2022   Medicare Annual Wellness (AWV)  10/19/2023   Influenza Vaccine  02/29/2024   COVID-19 Vaccine (5 - 2025-26 season) 03/31/2024   DTaP/Tdap/Td (3 - Td or Tdap) 03/06/2026   Pneumococcal Vaccine: 50+ Years  Completed   Hepatitis C Screening  Completed   Meningococcal B Vaccine  Aged Out        Assessment/Plan:  This is a routine wellness examination for  J[place].  Patient Care Team: Micheal Wolm ORN, MD as PCP - General Okey Vina GAILS, MD as PCP - Cardiology (Cardiology) Bensimhon, Toribio SAUNDERS, MD as PCP - Advanced Heart Failure (Cardiology) Inocencio Soyla Lunger, MD as PCP - Electrophysiology (Cardiology) [employer] Dallas NOVAK, MD (Inactive) as Consulting Physician (Cardiothoracic Surgery) Deterding, Lynwood, MD as Consulting Physician (Nephrology) Fernande Elspeth BROCKS, MD (Inactive) as Consulting Physician (Cardiology)  I have personally reviewed and noted the following in the patient's chart:   Medical and social history Use of alcohol, tobacco or illicit drugs  Current medications and supplements including opioid prescriptions. Functional ability and status Nutritional status Physical activity Advanced directives List of other physicians Hospitalizations, surgeries, and ER visits in previous 12 months Vitals Screenings to include cognitive, depression, and falls Referrals and appointments  No orders of the defined types were placed in this encounter.  In addition, I have reviewed and discussed with patient certain preventive protocols, quality metrics, and best practice recommendations. A written personalized care plan for preventive services as well as general preventive health recommendations were provided to patient.   Wilford Merryfield L Safari Cinque, CMA   06/16/2024   No follow-ups on file.  After Visit Summary: (MyChart) Due to this being a telephonic visit, the after visit summary with patients personalized plan was offered to patient via MyChart   Nurse Notes: Patient is due for a colonoscopy, however he declines for now.  He is also due for a flu vaccine and a shingrix vaccine.  He had no other concerns to address today.

## 2024-06-18 ENCOUNTER — Telehealth: Payer: Self-pay

## 2024-06-18 NOTE — Telephone Encounter (Signed)
 Returned wife's call ( per DPR) as requested by voice mail message.  She stated she wanted to check if patient is holding any fluid. She stated his BP has been a little elevated and not sure it fluid would be causing that.  Advised a automatic transmission is scheduled to come in over night and can call her tomorrow or she can send a remote transmission now if patient is with her using the mobile app.  She stated he is not there in the house with her and will wait to get the results from tomorrow's report.

## 2024-06-19 ENCOUNTER — Other Ambulatory Visit: Payer: Self-pay | Admitting: Family Medicine

## 2024-06-19 ENCOUNTER — Ambulatory Visit: Attending: Cardiology

## 2024-06-19 DIAGNOSIS — Z95810 Presence of automatic (implantable) cardiac defibrillator: Secondary | ICD-10-CM

## 2024-06-19 DIAGNOSIS — I5022 Chronic systolic (congestive) heart failure: Secondary | ICD-10-CM

## 2024-06-19 NOTE — Progress Notes (Signed)
 EPIC Encounter for ICM Monitoring  Patient Name: Bradley [name]. is a 68 y.o. male Date: 06/19/2024 Primary Care Physican: Micheal Wolm ORN, MD Primary Cardiologist: Bensimhon Electrophysiologist: Inocencio Nephrologist:  Deterding 08/23/2022 Weight: 198.2 lbs 09/26/2023 Office Weight: 193 lbs 11/30/2023 Office Weight: 196 lbs   05/16/2024 Office Weight:  192 lbs                                      Spoke with wife per DPR.  Transmission results reviewed.  She reports his BP has been trending high since October.   Dr Bensimhon ordered Cardura  to be taken if BP is >140.    BP is taken randomly at home instead of daily.  Cardura  dosage (ranges from 2 tablets to 1/2 tablet) and frequency have not been consistent. BP has been ranging 160's/90's/   05/20/2024 BP 160/93;  06/16/2024 High BP 164/95-155/92 and TODAY (06/19/2024) it is 149/89.  Pt eats restaurants foods frequently and this week he ate chinese food.   Since 05/19/2024 ICM Remote Transmission:  CorVue thoracic impedance suggesting possible dryness starting 06/16/2024.  Suggesting possible fluid accumulation from 05/27/2024-06/08/2024.   Prescribed:  Furosemide  20 mg Take 1 tablet (20 mg total) by mouth 4 (four) times a week.  Per med list, patient takes 1 tablet every Monday, Thursday and Saturday.      Labs: 05/12/2024 Creatinine 1.15, BUN 16, Potassium 4.4, Sodium 137, GFR >60  04/04/2024 Creatinine 1.16, BUN 21, Potassium 4.1, Sodium 141, GFR >60  04/03/2024 Creatinine 1.15, BUN 21, Potassium 3.5, Sodium 140, GFR >60  04/02/2024 Creatinine 1.19, BUN 23, Potassium 3.8, Sodium 140, GFR >60 03/31/2024 Creatinine 1.24, BUN 20, Potassium 4.1, Sodium 144, GFR >60  A complete set of results can be found in Results Review.   Recommendations:   Discussed effects of salt on BP and HF condition.  Advised to decrease dietary salt intake and drink 64 oz fluid daily but drink about 74 oz today and tomorrow to rehydrate. After tomorrow limit  fluid intake to 64 oz daily and salt intake to 2000 mg daily.   Explained the importance of taking BP daily and following Cardaura instructions.  Advised to check morning daily BP and take Cardura  1 tablet daily if >140.  Advised to follow Carduara instructions as prescribed   Call Dr Bensimhon's office on Monday, 06/23/2024 if BP remains consistently over 140 after taking Cardura  as prescribed over the next 5 days. Advised to use ER if BP increases or his condition changes.  Copy sent to Dr Bensimhon for review.    Follow-up plan: ICM clinic phone appointment on 06/25/2024 to recheck fluid levels.   91 day device clinic remote transmission 06/25/2024.     EP/Cardiology Office Visits:  Recall 05/11/2025 with EP APP.  Recall 08/11/2024 with Dr Cherrie.     Copy of ICM check sent to Dr. Inocencio.    Remote monitoring is medically necessary for Heart Failure Management.    Daily Thoracic Impedance ICM trend: 03/21/2024 through 06/19/2024.    12-14 Month Thoracic Impedance ICM trend:     Mitzie GORMAN Garner, RN 06/19/2024 7:55 AM

## 2024-06-24 ENCOUNTER — Encounter

## 2024-06-25 ENCOUNTER — Ambulatory Visit (INDEPENDENT_AMBULATORY_CARE_PROVIDER_SITE_OTHER)

## 2024-06-25 ENCOUNTER — Ambulatory Visit: Attending: Cardiology

## 2024-06-25 DIAGNOSIS — I5022 Chronic systolic (congestive) heart failure: Secondary | ICD-10-CM

## 2024-06-25 DIAGNOSIS — Z95810 Presence of automatic (implantable) cardiac defibrillator: Secondary | ICD-10-CM

## 2024-06-25 NOTE — Progress Notes (Signed)
 EPIC Encounter for ICM Monitoring  Patient Name: Bradley [name]. is a 68 y.o. male Date: 06/25/2024 Primary Care Physican: Micheal Wolm ORN, MD Primary Cardiologist: Bensimhon Electrophysiologist: Inocencio Nephrologist:  Deterding 08/23/2022 Weight: 198.2 lbs 09/26/2023 Office Weight: 193 lbs 11/30/2023 Office Weight: 196 lbs   05/16/2024 Office Weight:  192 lbs                                      Spoke with wife and heart failure questions reviewed.  Transmission results reviewed.   She reports patient is feeling much better and his BP has returned to his baseline.  Since 06/19/2024 ICM Remote Transmission:  CorVue thoracic impedance suggesting possible dryness starting 06/16/2024 and returned to normal 06/23/2024.     Prescribed:  Furosemide  20 mg Take 1 tablet (20 mg total) by mouth 4 (four) times a week.  Per med list, patient takes 1 tablet every Monday, Thursday and Saturday.      Labs: 05/12/2024 Creatinine 1.15, BUN 16, Potassium 4.4, Sodium 137, GFR >60  04/04/2024 Creatinine 1.16, BUN 21, Potassium 4.1, Sodium 141, GFR >60  04/03/2024 Creatinine 1.15, BUN 21, Potassium 3.5, Sodium 140, GFR >60  04/02/2024 Creatinine 1.19, BUN 23, Potassium 3.8, Sodium 140, GFR >60 03/31/2024 Creatinine 1.24, BUN 20, Potassium 4.1, Sodium 144, GFR >60  A complete set of results can be found in Results Review.   Recommendations:  No changes and encouraged to call if experiencing any fluid symptoms.   Follow-up plan: ICM clinic phone appointment on 08/04/2024.   91 day device clinic remote transmission 2/25/20265.     EP/Cardiology Office Visits:  Recall 05/11/2025 with EP APP.  Recall 08/11/2024 with Dr Cherrie.     Copy of ICM check sent to Dr. Inocencio.    Remote monitoring is medically necessary for Heart Failure Management.    Daily Thoracic Impedance ICM trend: 03/27/2024 through 06/23/2024.    12-14 Month Thoracic Impedance ICM trend:     Mitzie GORMAN Garner, RN 06/25/2024 7:09  AM

## 2024-06-27 LAB — CUP PACEART REMOTE DEVICE CHECK
Battery Remaining Longevity: 100 mo
Battery Remaining Percentage: 92 %
Battery Voltage: 3.04 V
Brady Statistic RV Percent Paced: 1 %
Date Time Interrogation Session: 20251126020747
HighPow Impedance: 66 Ohm
Implantable Lead Connection Status: 753985
Implantable Lead Implant Date: 20160603
Implantable Lead Location: 753860
Implantable Lead Model: 7122
Implantable Pulse Generator Implant Date: 20250714
Lead Channel Impedance Value: 410 Ohm
Lead Channel Pacing Threshold Amplitude: 1.25 V
Lead Channel Pacing Threshold Pulse Width: 1 ms
Lead Channel Sensing Intrinsic Amplitude: 7.3 mV
Lead Channel Setting Pacing Amplitude: 3 V
Lead Channel Setting Pacing Pulse Width: 1 ms
Lead Channel Setting Sensing Sensitivity: 0.5 mV
Pulse Gen Serial Number: 111074227

## 2024-06-30 ENCOUNTER — Ambulatory Visit: Payer: Self-pay | Admitting: Cardiology

## 2024-06-30 NOTE — Progress Notes (Signed)
 Remote ICD Transmission

## 2024-07-01 ENCOUNTER — Other Ambulatory Visit (HOSPITAL_COMMUNITY): Payer: Self-pay | Admitting: Internal Medicine

## 2024-07-05 ENCOUNTER — Other Ambulatory Visit: Payer: Self-pay | Admitting: Family Medicine

## 2024-07-16 ENCOUNTER — Other Ambulatory Visit: Payer: Self-pay | Admitting: Internal Medicine

## 2024-07-31 ENCOUNTER — Other Ambulatory Visit: Payer: Self-pay | Admitting: Internal Medicine

## 2024-08-04 ENCOUNTER — Ambulatory Visit

## 2024-08-04 DIAGNOSIS — I5022 Chronic systolic (congestive) heart failure: Secondary | ICD-10-CM

## 2024-08-04 DIAGNOSIS — Z95810 Presence of automatic (implantable) cardiac defibrillator: Secondary | ICD-10-CM

## 2024-08-04 NOTE — Progress Notes (Signed)
 EPIC Encounter for ICM Monitoring  Patient Name: Bradley [name]. is a 69 y.o. male Date: 08/04/2024 Primary Care Physican: Micheal Wolm ORN, MD Primary Cardiologist: Bensimhon Electrophysiologist: Inocencio Nephrologist:  Deterding 08/23/2022 Weight: 198.2 lbs 09/26/2023 Office Weight: 193 lbs 11/30/2023 Office Weight: 196 lbs   05/16/2024 Office Weight:  192 lbs                                      Attempted call to patient and unable to reach.   Transmission results reviewed.    Since 06/25/2024 ICM Remote Transmission:  CorVue thoracic impedance suggesting possible fluid accumulation starting 08/02/2024.     Prescribed:  Furosemide  20 mg Take 1 tablet (20 mg total) by mouth 4 (four) times a week.  Per med list, patient takes 1 tablet every Monday, Thursday and Saturday.      Labs: 05/12/2024 Creatinine 1.15, BUN 16, Potassium 4.4, Sodium 137, GFR >60  04/04/2024 Creatinine 1.16, BUN 21, Potassium 4.1, Sodium 141, GFR >60  04/03/2024 Creatinine 1.15, BUN 21, Potassium 3.5, Sodium 140, GFR >60  04/02/2024 Creatinine 1.19, BUN 23, Potassium 3.8, Sodium 140, GFR >60 03/31/2024 Creatinine 1.24, BUN 20, Potassium 4.1, Sodium 144, GFR >60  A complete set of results can be found in Results Review.   Recommendations:  Unable to reach.  Will send to Dr Bensimhon for review if patient is reached.   Follow-up plan: ICM clinic phone appointment on 08/12/2024 to recheck fluid levels.   91 day device clinic remote transmission 2/25/20265.     EP/Cardiology Office Visits:  Recall 05/11/2025 with EP APP.  Recall 08/11/2024 with Dr Cherrie.     Copy of ICM check sent to Dr. Inocencio.    Remote monitoring is medically necessary for Heart Failure Management.    Daily Thoracic Impedance ICM trend: 05/06/2024 through 08/04/2024.    12-14 Month Thoracic Impedance ICM trend:     Mitzie GORMAN Garner, RN 08/04/2024 12:16 PM

## 2024-08-05 ENCOUNTER — Telehealth: Payer: Self-pay

## 2024-08-05 NOTE — Telephone Encounter (Signed)
 Remote ICM transmission received.  Attempted call to patient regarding ICM remote transmission and no answer.

## 2024-08-11 ENCOUNTER — Other Ambulatory Visit: Payer: Self-pay | Admitting: Family Medicine

## 2024-08-11 NOTE — Telephone Encounter (Signed)
 Copied from CRM #[id number]. Topic: Clinical - Medication Refill >> Aug 11, 2024  9:18 AM Montie POUR wrote: Medication:  SYNTHROID  150 MCG tablet  Has the patient contacted their pharmacy? Yes (Agent: If no, request that the patient contact the pharmacy for the refill. If patient does not wish to contact the pharmacy document the reason why and proceed with request.) (Agent: If yes, when and what did the pharmacy advise?) Pharmacy needs order to refill  This is the patient's preferred pharmacy:  [id number] - [place], [place] - [address] [place] [address] Phone: [phone] Fax: [phone]  Is this the correct pharmacy for this prescription? Yes If no, delete pharmacy and type the correct one.   Has the prescription been filled recently? No  Is the patient out of the medication? No  Has the patient been seen for an appointment in the last year OR does the patient have an upcoming appointment? Yes  Can we respond through MyChart? No  Agent: Please be advised that Rx refills may take up to 3 business days. We ask that you follow-up with your pharmacy.

## 2024-08-12 ENCOUNTER — Ambulatory Visit: Attending: Cardiology

## 2024-08-12 DIAGNOSIS — I5022 Chronic systolic (congestive) heart failure: Secondary | ICD-10-CM

## 2024-08-12 DIAGNOSIS — Z95810 Presence of automatic (implantable) cardiac defibrillator: Secondary | ICD-10-CM

## 2024-08-12 NOTE — Progress Notes (Signed)
 EPIC Encounter for ICM Monitoring  Patient Name: Bradley [name]. is a 69 y.o. male Date: 08/12/2024 Primary Care Physican: Bradley Wolm ORN, MD Primary Cardiologist: Bradley [name] Electrophysiologist: Bradley [name] Nephrologist:  Bradley [name] 08/23/2022 Weight: 198.2 lbs 09/26/2023 Office Weight: 193 lbs 11/30/2023 Office Weight: 196 lbs   05/16/2024 Office Weight:  192 lbs   08/12/2024:  Does not weigh at home                                    Spoke with wife and heart failure questions reviewed.  Transmission results reviewed.  Pt asymptomatic for fluid accumulation.  Reports feeling well at this time and voices no complaints.     Since 08/04/2024 ICM Remote Transmission:  CorVue thoracic impedance suggesting possible fluid accumulation starting 08/02/2024 but trending back toward baseline.     Prescribed:  Furosemide  20 mg Take 1 tablet (20 mg total) by mouth 4 (four) times a week.  Confirmed 08/12/2024 he routinely takes 1 tablet every Monday, Thursday and Saturday.      Labs: 05/12/2024 Creatinine 1.15, BUN 16, Potassium 4.4, Sodium 137, GFR >60  04/04/2024 Creatinine 1.16, BUN 21, Potassium 4.1, Sodium 141, GFR >60  04/03/2024 Creatinine 1.15, BUN 21, Potassium 3.5, Sodium 140, GFR >60  04/02/2024 Creatinine 1.19, BUN 23, Potassium 3.8, Sodium 140, GFR >60 03/31/2024 Creatinine 1.24, BUN 20, Potassium 4.1, Sodium 144, GFR >60  A complete set of results can be found in Results Review.   Recommendations:  Advised to take extra day of Lasix  20 mg this week.  No changes and encouraged to call if experiencing any fluid symptoms.   Follow-up plan: ICM clinic phone appointment on 09/04/2024.   91 day device clinic remote transmission 2/25/20265.     EP/Cardiology Office Visits:  Recall 05/11/2025 with EP APP.  Recall 08/11/2024 with Dr Cherrie.     Copy of ICM check sent to Dr. Inocencio.      Remote monitoring is medically necessary for Heart Failure Management.    Daily Thoracic Impedance ICM  trend: 05/14/2024 through 08/12/2024.    12-14 Month Thoracic Impedance ICM trend:     Bradley GORMAN Garner, RN 08/12/2024 12:42 PM

## 2024-08-14 ENCOUNTER — Encounter (HOSPITAL_BASED_OUTPATIENT_CLINIC_OR_DEPARTMENT_OTHER): Payer: Self-pay | Admitting: Pulmonary Disease

## 2024-08-14 ENCOUNTER — Ambulatory Visit (INDEPENDENT_AMBULATORY_CARE_PROVIDER_SITE_OTHER): Admitting: Pulmonary Disease

## 2024-08-14 ENCOUNTER — Other Ambulatory Visit: Payer: Self-pay | Admitting: Family Medicine

## 2024-08-14 VITALS — BP 129/83 | HR 58 | Ht 73.0 in | Wt 202.6 lb

## 2024-08-14 DIAGNOSIS — E890 Postprocedural hypothyroidism: Secondary | ICD-10-CM

## 2024-08-14 DIAGNOSIS — G4733 Obstructive sleep apnea (adult) (pediatric): Secondary | ICD-10-CM | POA: Diagnosis not present

## 2024-08-14 NOTE — Progress Notes (Signed)
 "  New Patient Pulmonology Office Visit   Subjective:  Patient ID: Bradley Wares., male    DOB: [birth year]  MRN: [id number]  Referred by: Glena Harlene HERO, FNP  CC:  Chief Complaint  Patient presents with   Establish Care    Chronic systolic heart failure    Discussed the use of AI scribe software for clinical note transcription with the patient, who gave verbal consent to proceed.  History of Present Illness Bradley Shughart. is a 69 year old male with heart issues and an ICD who presents with sleep disorder breathing. He was referred by Dr. Bettyjane for evaluation of sleep disorder breathing.  He has difficulty initiating sleep and wakes 4 to 6 times nightly, mainly to urinate, despite taking Lasix  20 mg three times a week during the day. He goes to bed around 9-10 PM and needs more than 30 minutes to fall asleep. He sleeps on his side with two small pillows.  He wakes around 8 AM and remains in bed until 10 AM, feeling unrefreshed. No morning headaches or dry mouth. His wife reports witnessed apneas during sleep, though he does not snore.  He has valve repair and an ICD and takes thyroid  medication, mexiletine, Ranexa , Entresto , Cardura , Farxiga , and Abilify . His depression is well controlled.   ESS 8 Wife has noted apneas  Retired administrator, sports from [place]   PMH : HFrEF / NICM with VT s/p ICD placement 02/11/2024,  congenital AS s/p balloon valvotomy at age 61 / AV repair 1968, Bentall 2001 , redo 2015   CAD  HTN,  thyroid  cancer s/p thyroidectomy with residual hypothyroidism  MDD with psychotic features  ROS  Sob on walking Anxiety, depression   Allergies: Oxycodone , Iodinated contrast media, and Rifampin  Current Medications[1] Past Medical History:  Diagnosis Date   Anxiety    Aortic stenosis    s/p Bentall with bioprosthetic AVR 02/2010; Last echo (9/11): Moderate LVH, EF 45-50%, AVR functioning appropriately, aortic valve mean gradient 21, diastolic  dysfunction. Chest MRA (2/13): Mild to moderate dilatation at the sinus of Valsalva at 4.1 cm, mild dilatation ascending aorta distal to the tube graft at 3.9 cm, moderate dilatation of the innominate artery a 2.1 cm;     CHF (congestive heart failure) (HCC)    Depression    ESRD on dialysis (HCC)    a. 09/2013 felt to be related to gentamycin b. no longer requiring HD   Hx of cardiac cath    a. LHC in 02/2010: normal cors   Hx of echocardiogram 02/2014   Echo (8/15):  Mod LV, EF 35-40%, Gr 1 DD, AVR ok (mean 14 mmHg), mild LAE   Hypertension    Hypothyroidism, postsurgical    Prosthetic valve endocarditis    Staphylococcus aureus bacteremia    Thyroid  cancer (HCC)    Ventricular tachycardia (HCC)    a. s/p STJ ICD   Past Surgical History:  Procedure Laterality Date   AORTIC VALVE REPAIR  07/31/1966   AORTIC VALVE REPLACEMENT  07/31/2009   AV FISTULA PLACEMENT Left 10/08/2013   Procedure: ARTERIOVENOUS (AV) FISTULA CREATION- LEFT ARM; Radial Cephalic ;  Surgeon: Lynwood JONETTA Collum, MD;  Location: [facility] OR;  Service: Vascular;  Laterality: Left;   BENTALL PROCEDURE N/A 10/28/2013   Procedure: REDO BENTALL PROCEDURE, debridment of aoritc root abscess, replacement of aortic root, ascending aorta and aortic valve with homograft. Insertion of left femoral arterial line;  Surgeon: Dallas KATHEE Jude, MD;  Location:  [facility] OR;  Service: Open Heart Surgery;  Laterality: N/A;   BIOPSY  07/22/2019   Procedure: BIOPSY;  Surgeon: Legrand Victory LITTIE DOUGLAS, MD;  Location: [facility];  Service: Gastroenterology;;   CARDIAC CATHETERIZATION  03/02/2010   NORMAL CORONARY ARTERY   CARDIAC CATHETERIZATION N/A 01/01/2015   Procedure: Coronary/Graft Angiography;  Surgeon: Debby DELENA Sor, MD;  Location: [facility] INVASIVE CV LAB;  Service: Cardiovascular;  Laterality: N/A;   CARDIAC VALVE REPLACEMENT     COLONOSCOPY WITH PROPOFOL  N/A 07/22/2019   Procedure: COLONOSCOPY WITH PROPOFOL ;  Surgeon: Legrand Victory LITTIE DOUGLAS, MD;  Location: [facility];  Service: Gastroenterology;  Laterality: N/A;   EP IMPLANTABLE DEVICE N/A 01/01/2015   Procedure: ICD Implant;  Surgeon: Elspeth JAYSON Sage, MD;  Location: [facility] INVASIVE CV LAB;  Service: Cardiovascular;  Laterality: N/A;   FETAL SURGERY FOR CONGENITAL HERNIA  04/03/2024   HEMOSTASIS CLIP PLACEMENT  07/22/2019   Procedure: HEMOSTASIS CLIP PLACEMENT;  Surgeon: Legrand Victory LITTIE DOUGLAS, MD;  Location: [facility];  Service: Gastroenterology;;   ICD GENERATOR CHANGEOUT N/A 02/11/2024   Procedure: ICD GENERATOR CHANGEOUT;  Surgeon: Inocencio Soyla Lunger, MD;  Location: [facility] INVASIVE CV LAB;  Service: Cardiovascular;  Laterality: N/A;   INGUINAL HERNIA REPAIR Right 04/03/2024   Procedure: REPAIR, HERNIA, INGUINAL, ADULT;  Surgeon: Ann Fine, MD;  Location: [facility] OR;  Service: General;  Laterality: Right;   INSERTION OF MESH Right 04/03/2024   Procedure: INSERTION OF MESH;  Surgeon: Ann Fine, MD;  Location[facility] OR;  Service: General;  Laterality: Right;   INTRAOPERATIVE TRANSESOPHAGEAL ECHOCARDIOGRAM N/A 10/28/2013   Procedure: INTRAOPERATIVE TRANSESOPHAGEAL ECHOCARDIOGRAM;  Surgeon: Dallas KATHEE Jude, MD;  Location: [facility] OR;  Service: Open Heart Surgery;  Laterality: N/A;   PERIPHERAL VASCULAR CATHETERIZATION N/A 01/01/2015   Procedure: Aortic Arch Angiography;  Surgeon: Debby DELENA Sor, MD;  Location: [facility] INVASIVE CV LAB;  Service: Cardiovascular;  Laterality: N/A;   POLYPECTOMY  07/22/2019   Procedure: POLYPECTOMY;  Surgeon: Legrand Victory LITTIE DOUGLAS, MD;  Location: [facility];  Service: Gastroenterology;;   RIGHT/LEFT HEART CATH AND CORONARY ANGIOGRAPHY N/A 07/09/2023   Procedure: RIGHT/LEFT HEART CATH AND CORONARY ANGIOGRAPHY;  Surgeon: Cherrie Toribio SAUNDERS, MD;  Location: [facility] INVASIVE CV LAB;  Service: Cardiovascular;  Laterality: N/A;   SHOULDER ARTHROSCOPY W/ ROTATOR CUFF REPAIR Right 07/31/2010   STERNOTOMY     REDO   TEE WITHOUT CARDIOVERSION N/A 10/24/2013   Procedure: TRANSESOPHAGEAL  ECHOCARDIOGRAM (TEE);  Surgeon: Leim VEAR Moose, MD;  Location: [facility] ENDOSCOPY;  Service: Cardiovascular;  Laterality: N/A;   THYROIDECTOMY  ~ 2005   TRANSTHORACIC ECHOCARDIOGRAM  03/31/2010   SHOWED MILD REDUCTION OF LV FUNCTION   Family History  Problem Relation Age of Onset   Hypertension Mother    Heart disease Father    Stroke Father    Hypertension Father    Thyroid  cancer Sister    Thyroid  cancer Sister    Kidney Stones Sister    Heart attack Neg Hx    Colon cancer Neg Hx    Colon polyps Neg Hx    Esophageal cancer Neg Hx    Stomach cancer Neg Hx    Rectal cancer Neg Hx    Social History   Socioeconomic History   Marital status: Married    Spouse name: Hadassah   Number of children: Not on file   Years of education: Not on file   Highest education level: Not on file  Occupational History   Occupation: RETIRED  Tobacco Use  Smoking status: Never   Smokeless tobacco: Never  Vaping Use   Vaping status: Never Used  Substance and Sexual Activity   Alcohol use: No    Alcohol/week: 0.0 standard drinks of alcohol    Comment: Occasional   Drug use: Yes    Frequency: 1.0 times per week    Types: Marijuana   Sexual activity: Never  Other Topics Concern   Not on file  Social History Narrative   ** Merged History Encounter ** Lives at home with walker.  Does not drive.  ESRD T, H, Sa.  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      Lives with wife/2025/has 1 dog and 1 cat   Social Drivers of Health   Tobacco Use: Low Risk (08/14/2024)   Patient History    Smoking Tobacco Use: Never    Smokeless Tobacco Use: Never    Passive Exposure: Not on file  Financial Resource Strain: Low Risk (04/05/2022)   Overall Financial Resource Strain (CARDIA)    Difficulty of Paying Living Expenses: Not very hard  Food Insecurity: No Food Insecurity (06/16/2024)   Epic    Worried About Programme Researcher, Broadcasting/film/video in the Last Year: Never true    Ran Out of Food in the Last Year: Never true   Transportation Needs: No Transportation Needs (06/16/2024)   Epic    Lack of Transportation (Medical): No    Lack of Transportation (Non-Medical): No  Physical Activity: Inactive (06/16/2024)   Exercise Vital Sign    Days of Exercise per Week: 0 days    Minutes of Exercise per Session: 0 min  Stress: Stress Concern Present (06/16/2024)   [employer] of Occupational Health - Occupational Stress Questionnaire    Feeling of Stress: To some extent  Social Connections: Moderately Isolated (06/16/2024)   Social Connection and Isolation Panel    Frequency of Communication with Friends and Family: More than three times a week    Frequency of Social Gatherings with Friends and Family: Once a week    Attends Religious Services: Never    Database Administrator or Organizations: No    Attends Banker Meetings: Never    Marital Status: Married  Catering Manager Violence: Not At Risk (06/16/2024)   Epic    Fear of Current or Ex-Partner: No    Emotionally Abused: No    Physically Abused: No    Sexually Abused: No  Depression (PHQ2-9): Medium Risk (06/16/2024)   Depression (PHQ2-9)    PHQ-2 Score: 7  Alcohol Screen: Low Risk (10/17/2021)   Alcohol Screen    Last Alcohol Screening Score (AUDIT): 0  Housing: Unknown (06/16/2024)   Epic    Unable to Pay for Housing in the Last Year: No    Number of Times Moved in the Last Year: Not on file    Homeless in the Last Year: No  Utilities: Not At Risk (06/16/2024)   Epic    Threatened with loss of utilities: No  Health Literacy: Adequate Health Literacy (06/16/2024)   B1300 Health Literacy    Frequency of need for help with medical instructions: Never       Objective:  BP 129/83   Pulse (!) 58   Ht 6' 1 (1.854 m)   Wt 202 lb 9.6 oz (91.9 kg)   SpO2 98%   BMI 26.73 kg/m    Physical Exam  Gen. Pleasant, well-nourished, in no distress ENT - no thrush, clas 2 airway,no pallor/icterus,no post nasal drip Neck: No  JVD,  no thyromegaly, no carotid bruits Lungs: no use of accessory muscles, no dullness to percussion, clear without rales or rhonchi  Cardiovascular: Rhythm regular, heart sounds  normal, no murmurs or gallops, no peripheral edema Musculoskeletal: No deformities, no cyanosis or clubbing        Assessment & Plan:  Assessment and Plan Assessment & Plan Obstructive sleep apnea Suspected obstructive sleep apnea due to reported nocturnal awakenings and wife's observation of possible apneic episodes. Heart function is reduced at 20-25%, increasing the risk of cardiac stress if apnea is present. Differential includes snoring versus obstructive events causing oxygen desaturation. - Ordered home sleep study to evaluate for obstructive sleep apnea. - Coordinated with [employer] for equipment delivery and study setup. - Plan for potential in-lab sleep study if home study indicates significant apnea. - Will review study results in 2 weeks post-study to determine further management.       No follow-ups on file.   Bradley ROCKFORD Jude, MD    [1]  Current Outpatient Medications:    acetaminophen  (TYLENOL ) 500 MG tablet, Take 2 tablets (1,000 mg total) by mouth every 6 (six) hours as needed., Disp: , Rfl:    amoxicillin (AMOXIL) 500 MG capsule, SMARTSIG:4 Capsule(s) By Mouth (Patient taking differently: Take 500 mg by mouth as directed. Take 4 capsules prior to dental procedures), Disp: , Rfl:    ARIPiprazole  (ABILIFY ) 5 MG tablet, TAKE 1 TABLET BY MOUTH EVERY DAY AT NIGHT, Disp: 90 tablet, Rfl: 1   aspirin  EC 81 MG tablet, Take 81 mg by mouth at bedtime. , Disp: , Rfl:    atorvastatin  (LIPITOR) 40 MG tablet, TAKE 1 TABLET BY MOUTH EVERY DAY, Disp: 90 tablet, Rfl: 0   carvedilol  (COREG ) 6.25 MG tablet, TAKE 1 TABLET BY MOUTH TWICE A DAY WITH FOOD, Disp: 180 tablet, Rfl: 3   clonazePAM  (KLONOPIN ) 1 MG tablet, Take 0.5 mg by mouth daily as needed for anxiety., Disp: , Rfl:    docusate sodium  (COLACE) 100 MG  capsule, Take 100 mg by mouth 3 (three) times a week., Disp: , Rfl:    doxazosin  (CARDURA ) 1 MG tablet, TAKE 1 TABLET (1 MG TOTAL) BY MOUTH DAILY. FOR SBP GREATER THAN 140, Disp: 90 tablet, Rfl: 0   ENTRESTO  97-103 MG, TAKE 1 TABLET BY MOUTH TWICE A DAY, Disp: 180 tablet, Rfl: 1   FARXIGA  10 MG TABS tablet, TAKE 1 TABLET BY MOUTH EVERY DAY, Disp: 30 tablet, Rfl: 11   furosemide  (LASIX ) 20 MG tablet, TAKE 1 TABLET BY MOUTH 4 TIMES A WEEK., Disp: 48 tablet, Rfl: 3   mexiletine (MEXITIL ) 150 MG capsule, TAKE 2 CAPSULES BY MOUTH EVERY MORNING AND 2 CAPSULES BY MOUTH EVERY EVENING., Disp: 360 capsule, Rfl: 3   Multiple Vitamins-Minerals (AIRBORNE) CHEW, Chew 1 tablet by mouth at bedtime., Disp: , Rfl:    omeprazole  (PRILOSEC) 20 MG capsule, Take 20 mg by mouth daily as needed (Stomach)., Disp: , Rfl:    ranolazine  (RANEXA ) 500 MG 12 hr tablet, TAKE 1 TABLET BY MOUTH TWICE A DAY, Disp: 180 tablet, Rfl: 1   sertraline  (ZOLOFT ) 50 MG tablet, TAKE 1 TABLET BY MOUTH EVERY DAY, Disp: 90 tablet, Rfl: 0   SYNTHROID  150 MCG tablet, TAKE 1 TABLET BY MOUTH EVERY DAY, Disp: 30 tablet, Rfl: 0  "

## 2024-08-14 NOTE — Patient Instructions (Addendum)
 X home sleep test

## 2024-08-14 NOTE — Progress Notes (Signed)
 Epworth Sleepiness Scale  Use the following scale to choose the most appropriate number for each situation. 0 Would never nod off 1  Slight  chance of nodding off 2 Moderate chance of nodding off 3 High chance of nodding off  Sitting and reading: 2 Watching TV: 1 Sitting, inactive, in a public place (e.g., in a meeting, theater, or dinner event): 0 As a passenger in a car for an hour or more without stopping for a break: 1 Lying down to rest when circumstances permit:3 Sitting and talking to someone: 0 Sitting quietly after a meal without alcohol: 1 In a car, while stopped for a few  minutes in traffic or at a light: 0  TOTOAL: 8

## 2024-08-18 ENCOUNTER — Telehealth: Payer: Self-pay

## 2024-08-18 NOTE — Telephone Encounter (Signed)
 Rx sent.

## 2024-08-18 NOTE — Telephone Encounter (Signed)
 Patient's wife informed of the message and labs ordered

## 2024-08-18 NOTE — Addendum Note (Signed)
 Addended by: METTA KRISTEN CROME on: 08/18/2024 08:49 AM   Modules accepted: Orders

## 2024-08-18 NOTE — Telephone Encounter (Signed)
 Copied from CRM [id number]. Topic: Clinical - Prescription Issue >> Aug 18, 2024  8:40 AM Laymon HERO wrote: Reason for CRM: Refill for SYNTHROID  150 MCG tablet was denied. Patient is out of medication now. Please contact patient

## 2024-08-19 ENCOUNTER — Other Ambulatory Visit

## 2024-08-19 ENCOUNTER — Ambulatory Visit: Payer: Self-pay | Admitting: Family Medicine

## 2024-08-19 DIAGNOSIS — E890 Postprocedural hypothyroidism: Secondary | ICD-10-CM | POA: Diagnosis not present

## 2024-08-19 LAB — TSH: TSH: 5.94 u[IU]/mL — ABNORMAL HIGH (ref 0.35–5.50)

## 2024-08-28 NOTE — Progress Notes (Signed)
 31 day ICM Remote transmission canceled due to [facility] clinic is on hold until further notice.  91 day remote monitoring will continue per protocol.

## 2024-09-04 ENCOUNTER — Ambulatory Visit

## 2024-09-23 ENCOUNTER — Encounter

## 2024-09-24 ENCOUNTER — Ambulatory Visit

## 2024-12-23 ENCOUNTER — Encounter

## 2024-12-24 ENCOUNTER — Ambulatory Visit

## 2025-03-24 ENCOUNTER — Encounter

## 2025-03-25 ENCOUNTER — Ambulatory Visit
